# Patient Record
Sex: Female | Born: 1955
Health system: Southern US, Community
[De-identification: ages and names within clinical notes are randomized; demographics above are authoritative.]

## PROBLEM LIST (undated history)

## (undated) DIAGNOSIS — Z9889 Other specified postprocedural states: Secondary | ICD-10-CM

## (undated) DIAGNOSIS — D649 Anemia, unspecified: Secondary | ICD-10-CM

## (undated) DIAGNOSIS — E7800 Pure hypercholesterolemia, unspecified: Secondary | ICD-10-CM

## (undated) DIAGNOSIS — C801 Malignant (primary) neoplasm, unspecified: Secondary | ICD-10-CM

## (undated) DIAGNOSIS — Z720 Tobacco use: Secondary | ICD-10-CM

## (undated) DIAGNOSIS — I10 Essential (primary) hypertension: Secondary | ICD-10-CM

## (undated) DIAGNOSIS — R0600 Dyspnea, unspecified: Secondary | ICD-10-CM

## (undated) DIAGNOSIS — F419 Anxiety disorder, unspecified: Secondary | ICD-10-CM

## (undated) DIAGNOSIS — K219 Gastro-esophageal reflux disease without esophagitis: Secondary | ICD-10-CM

## (undated) DIAGNOSIS — R112 Nausea with vomiting, unspecified: Secondary | ICD-10-CM

## (undated) HISTORY — PX: ABDOMINAL HYSTERECTOMY: SHX81

## (undated) HISTORY — DX: Gastro-esophageal reflux disease without esophagitis: K21.9

## (undated) HISTORY — PX: DILATION AND CURETTAGE OF UTERUS: SHX78

## (undated) HISTORY — DX: Tobacco use: Z72.0

## (undated) HISTORY — DX: Pure hypercholesterolemia, unspecified: E78.00

## (undated) HISTORY — PX: TUBAL LIGATION: SHX77

## (undated) HISTORY — PX: CHOLECYSTECTOMY: SHX55

## (undated) HISTORY — DX: Anxiety disorder, unspecified: F41.9

---

## 1998-09-30 ENCOUNTER — Other Ambulatory Visit: Admission: RE | Admit: 1998-09-30 | Discharge: 1998-09-30 | Payer: Self-pay | Admitting: *Deleted

## 1998-10-23 ENCOUNTER — Encounter: Payer: Self-pay | Admitting: Internal Medicine

## 1998-10-23 ENCOUNTER — Ambulatory Visit (HOSPITAL_COMMUNITY): Admission: RE | Admit: 1998-10-23 | Discharge: 1998-10-23 | Payer: Self-pay | Admitting: Internal Medicine

## 1999-03-07 ENCOUNTER — Encounter: Payer: Self-pay | Admitting: *Deleted

## 1999-03-09 ENCOUNTER — Observation Stay (HOSPITAL_COMMUNITY): Admission: RE | Admit: 1999-03-09 | Discharge: 1999-03-10 | Payer: Self-pay | Admitting: *Deleted

## 1999-08-14 ENCOUNTER — Other Ambulatory Visit: Admission: RE | Admit: 1999-08-14 | Discharge: 1999-08-14 | Payer: Self-pay | Admitting: *Deleted

## 1999-08-24 ENCOUNTER — Encounter: Payer: Self-pay | Admitting: Internal Medicine

## 1999-08-24 ENCOUNTER — Encounter: Admission: RE | Admit: 1999-08-24 | Discharge: 1999-08-24 | Payer: Self-pay | Admitting: Internal Medicine

## 2000-08-26 ENCOUNTER — Encounter: Payer: Self-pay | Admitting: Internal Medicine

## 2000-08-26 ENCOUNTER — Encounter: Admission: RE | Admit: 2000-08-26 | Discharge: 2000-08-26 | Payer: Self-pay | Admitting: Internal Medicine

## 2000-08-27 ENCOUNTER — Encounter: Admission: RE | Admit: 2000-08-27 | Discharge: 2000-08-27 | Payer: Self-pay | Admitting: Internal Medicine

## 2000-08-27 ENCOUNTER — Encounter: Payer: Self-pay | Admitting: Internal Medicine

## 2000-09-20 ENCOUNTER — Encounter: Payer: Self-pay | Admitting: General Surgery

## 2000-09-23 ENCOUNTER — Encounter: Payer: Self-pay | Admitting: General Surgery

## 2000-09-23 ENCOUNTER — Observation Stay: Admission: RE | Admit: 2000-09-23 | Discharge: 2000-09-24 | Payer: Self-pay | Admitting: General Surgery

## 2000-10-03 ENCOUNTER — Other Ambulatory Visit: Admission: RE | Admit: 2000-10-03 | Discharge: 2000-10-03 | Payer: Self-pay | Admitting: *Deleted

## 2000-11-01 ENCOUNTER — Encounter: Admission: RE | Admit: 2000-11-01 | Discharge: 2000-11-01 | Payer: Self-pay | Admitting: Internal Medicine

## 2000-11-01 ENCOUNTER — Encounter: Payer: Self-pay | Admitting: Internal Medicine

## 2001-11-12 ENCOUNTER — Encounter: Admission: RE | Admit: 2001-11-12 | Discharge: 2001-11-12 | Payer: Self-pay | Admitting: Internal Medicine

## 2001-11-12 ENCOUNTER — Encounter: Payer: Self-pay | Admitting: Internal Medicine

## 2002-09-24 ENCOUNTER — Other Ambulatory Visit: Admission: RE | Admit: 2002-09-24 | Discharge: 2002-09-24 | Payer: Self-pay | Admitting: *Deleted

## 2003-08-12 ENCOUNTER — Encounter: Admission: RE | Admit: 2003-08-12 | Discharge: 2003-08-12 | Payer: Self-pay | Admitting: *Deleted

## 2003-11-03 ENCOUNTER — Other Ambulatory Visit: Admission: RE | Admit: 2003-11-03 | Discharge: 2003-11-03 | Payer: Self-pay | Admitting: *Deleted

## 2004-11-06 ENCOUNTER — Other Ambulatory Visit: Admission: RE | Admit: 2004-11-06 | Discharge: 2004-11-06 | Payer: Self-pay | Admitting: *Deleted

## 2005-02-08 ENCOUNTER — Encounter: Admission: RE | Admit: 2005-02-08 | Discharge: 2005-02-08 | Payer: Self-pay | Admitting: *Deleted

## 2005-12-18 ENCOUNTER — Other Ambulatory Visit: Admission: RE | Admit: 2005-12-18 | Discharge: 2005-12-18 | Payer: Self-pay | Admitting: *Deleted

## 2006-12-04 ENCOUNTER — Encounter: Admission: RE | Admit: 2006-12-04 | Discharge: 2006-12-04 | Payer: Self-pay | Admitting: Internal Medicine

## 2007-01-28 ENCOUNTER — Other Ambulatory Visit: Admission: RE | Admit: 2007-01-28 | Discharge: 2007-01-28 | Payer: Self-pay | Admitting: *Deleted

## 2007-09-19 ENCOUNTER — Encounter: Admission: RE | Admit: 2007-09-19 | Discharge: 2007-09-19 | Payer: Self-pay | Admitting: *Deleted

## 2007-11-27 ENCOUNTER — Encounter: Payer: Self-pay | Admitting: Cardiology

## 2008-04-15 ENCOUNTER — Encounter: Admission: RE | Admit: 2008-04-15 | Discharge: 2008-04-15 | Payer: Self-pay | Admitting: Internal Medicine

## 2008-06-29 ENCOUNTER — Ambulatory Visit: Payer: Self-pay | Admitting: Internal Medicine

## 2008-07-20 ENCOUNTER — Other Ambulatory Visit: Admission: RE | Admit: 2008-07-20 | Discharge: 2008-07-20 | Payer: Self-pay | Admitting: Gynecology

## 2008-09-15 ENCOUNTER — Ambulatory Visit: Payer: Self-pay | Admitting: Internal Medicine

## 2008-12-01 ENCOUNTER — Ambulatory Visit: Payer: Self-pay | Admitting: Internal Medicine

## 2008-12-24 ENCOUNTER — Encounter: Admission: RE | Admit: 2008-12-24 | Discharge: 2008-12-24 | Payer: Self-pay | Admitting: Orthopedic Surgery

## 2009-11-10 ENCOUNTER — Encounter: Payer: Self-pay | Admitting: Cardiology

## 2009-11-10 ENCOUNTER — Other Ambulatory Visit: Admission: RE | Admit: 2009-11-10 | Discharge: 2009-11-10 | Payer: Self-pay | Admitting: Internal Medicine

## 2009-11-10 ENCOUNTER — Ambulatory Visit: Payer: Self-pay | Admitting: Internal Medicine

## 2010-06-30 ENCOUNTER — Ambulatory Visit: Payer: Self-pay | Admitting: Internal Medicine

## 2010-09-30 ENCOUNTER — Encounter: Payer: Self-pay | Admitting: Internal Medicine

## 2010-10-01 ENCOUNTER — Encounter: Payer: Self-pay | Admitting: Orthopedic Surgery

## 2010-10-01 ENCOUNTER — Encounter: Payer: Self-pay | Admitting: *Deleted

## 2010-10-02 ENCOUNTER — Encounter: Payer: Self-pay | Admitting: Orthopedic Surgery

## 2011-03-10 ENCOUNTER — Other Ambulatory Visit: Payer: Self-pay | Admitting: Internal Medicine

## 2011-04-05 ENCOUNTER — Encounter: Payer: Self-pay | Admitting: Internal Medicine

## 2011-04-05 ENCOUNTER — Ambulatory Visit: Payer: Self-pay | Admitting: Internal Medicine

## 2011-04-10 ENCOUNTER — Other Ambulatory Visit: Payer: Self-pay | Admitting: Internal Medicine

## 2011-04-26 ENCOUNTER — Ambulatory Visit (INDEPENDENT_AMBULATORY_CARE_PROVIDER_SITE_OTHER): Payer: 59 | Admitting: Internal Medicine

## 2011-04-26 ENCOUNTER — Encounter: Payer: Self-pay | Admitting: Internal Medicine

## 2011-04-26 VITALS — BP 120/62 | HR 72 | Temp 98.9°F | Ht 63.5 in | Wt 146.0 lb

## 2011-04-26 DIAGNOSIS — F411 Generalized anxiety disorder: Secondary | ICD-10-CM

## 2011-04-26 DIAGNOSIS — N3289 Other specified disorders of bladder: Secondary | ICD-10-CM

## 2011-04-26 DIAGNOSIS — R102 Pelvic and perineal pain: Secondary | ICD-10-CM

## 2011-04-26 DIAGNOSIS — F419 Anxiety disorder, unspecified: Secondary | ICD-10-CM

## 2011-04-26 DIAGNOSIS — N949 Unspecified condition associated with female genital organs and menstrual cycle: Secondary | ICD-10-CM

## 2011-04-26 LAB — POCT URINALYSIS DIPSTICK
Bilirubin, UA: NEGATIVE
Glucose, UA: NEGATIVE
Ketones, UA: NEGATIVE
Leukocytes, UA: NEGATIVE
Nitrite, UA: NEGATIVE
Protein, UA: NEGATIVE
Spec Grav, UA: 1.02
Urobilinogen, UA: NEGATIVE
pH, UA: 5

## 2011-04-27 ENCOUNTER — Encounter: Payer: Self-pay | Admitting: Internal Medicine

## 2011-04-29 ENCOUNTER — Encounter: Payer: Self-pay | Admitting: Internal Medicine

## 2011-04-29 DIAGNOSIS — F419 Anxiety disorder, unspecified: Secondary | ICD-10-CM | POA: Insufficient documentation

## 2011-04-29 NOTE — Progress Notes (Signed)
  Subjective:    Patient ID: Virginia [name], female    DOB: 1955-09-23, 55 y.o.   MRN: [id number]  HPI patient went to see GYN physician recently regarding some discomfort in her GU and vaginal area. Apparently discomfort has since resolved but she was worried about possible urinary tract infection. Currently having no dysuria or frequency    Review of Systems     Objective:   Physical Exam no CVA tenderness. Urinalysis within normal limits       Assessment & Plan:  Vaginal pain  No urinary tract infection detected  Patient discuss any further issues with vaginal pain with GYN physician

## 2011-05-09 ENCOUNTER — Other Ambulatory Visit: Payer: Self-pay | Admitting: Internal Medicine

## 2011-06-19 ENCOUNTER — Ambulatory Visit (INDEPENDENT_AMBULATORY_CARE_PROVIDER_SITE_OTHER): Payer: 59 | Admitting: Internal Medicine

## 2011-06-19 ENCOUNTER — Encounter: Payer: Self-pay | Admitting: Internal Medicine

## 2011-06-19 VITALS — BP 122/76 | HR 78 | Temp 98.1°F | Ht 63.0 in | Wt 147.0 lb

## 2011-06-19 DIAGNOSIS — J40 Bronchitis, not specified as acute or chronic: Secondary | ICD-10-CM

## 2011-06-19 NOTE — Progress Notes (Signed)
  Subjective:    Patient ID: Virginia [name], female    DOB: [birth year], 55 y.o.   MRN: [id number]  HPI onset of URI symptoms and cough on 06/16/2011. Patient has not gotten better. Cough has become quite bothersome and is becoming productive of yellow sputum. Has malaise and fatigue. Denies sore throat or ear pain.    Review of Systems     Objective:   Physical Exam left TM slightly full right TM clear; pharynx slightly injected without exudate; neck is supple without significant adenopathy; chest clear. Cough sounds deep and congested.        Assessment & Plan:  Bronchitis  Plan: Biaxin 500 mg by mouth twice daily for 10 days; Hycodan 8 ounces 1 teaspoon by mouth every 6 hours for cough

## 2011-09-21 ENCOUNTER — Other Ambulatory Visit: Payer: Self-pay

## 2011-09-21 MED ORDER — ALPRAZOLAM 0.25 MG PO TABS: 0.2500 mg | ORAL_TABLET | Freq: Two times a day (BID) | ORAL | Status: DC | PRN

## 2011-09-24 ENCOUNTER — Encounter (HOSPITAL_COMMUNITY): Payer: Self-pay | Admitting: *Deleted

## 2011-09-24 ENCOUNTER — Other Ambulatory Visit: Payer: Self-pay

## 2011-09-24 ENCOUNTER — Telehealth: Payer: Self-pay | Admitting: Cardiology

## 2011-09-24 ENCOUNTER — Emergency Department (HOSPITAL_COMMUNITY)
Admission: EM | Admit: 2011-09-24 | Discharge: 2011-09-24 | Discharge status: Against Medical Advice | Payer: 59 | Attending: Emergency Medicine | Admitting: Emergency Medicine

## 2011-09-24 DIAGNOSIS — M79609 Pain in unspecified limb: Secondary | ICD-10-CM | POA: Insufficient documentation

## 2011-09-24 HISTORY — DX: Essential (primary) hypertension: I10

## 2011-09-24 NOTE — Telephone Encounter (Signed)
Pt starting having shoulder, arm, neck pain since Friday, dizziness when closed her eyes, pls call

## 2011-09-24 NOTE — ED Notes (Signed)
Pt complains of pain in left arm and neck, started Friday, relief with ibuprofen. Pt sts that she has a hx of shoulder problems and pain. Also sts that she has periodic cramps below left ribs that feel like gas.

## 2011-09-24 NOTE — ED Notes (Signed)
To ed for eval of left arm pain, neck pain, and shoulder pain. Started saturday

## 2011-09-24 NOTE — Telephone Encounter (Signed)
She should see her PCP about these complaints and have him refer her back to [name] if PCP thinks cardiac

## 2011-09-24 NOTE — Telephone Encounter (Signed)
Advised patient, stated she would call PCP

## 2011-09-24 NOTE — Telephone Encounter (Signed)
Has not been seen in about 10 years.  Left sided arm pain, weakness over weekend, and heart racing at times.  See or refer to PCP

## 2011-09-25 ENCOUNTER — Other Ambulatory Visit: Payer: Self-pay

## 2011-09-25 MED ORDER — ALPRAZOLAM 0.25 MG PO TABS: 0.2500 mg | ORAL_TABLET | Freq: Two times a day (BID) | ORAL | Status: DC | PRN

## 2011-10-15 ENCOUNTER — Encounter: Payer: Self-pay | Admitting: Internal Medicine

## 2011-10-15 ENCOUNTER — Ambulatory Visit (INDEPENDENT_AMBULATORY_CARE_PROVIDER_SITE_OTHER): Payer: 59 | Admitting: Internal Medicine

## 2011-10-15 VITALS — BP 142/90 | HR 80 | Temp 98.4°F | Wt 149.5 lb

## 2011-10-15 DIAGNOSIS — J029 Acute pharyngitis, unspecified: Secondary | ICD-10-CM

## 2011-10-15 DIAGNOSIS — J069 Acute upper respiratory infection, unspecified: Secondary | ICD-10-CM

## 2011-10-15 LAB — POCT RAPID STREP A (OFFICE): Rapid Strep A Screen: NEGATIVE

## 2011-11-01 ENCOUNTER — Other Ambulatory Visit: Payer: 59 | Admitting: Internal Medicine

## 2011-11-02 ENCOUNTER — Encounter: Payer: 59 | Admitting: Internal Medicine

## 2011-11-10 ENCOUNTER — Encounter: Payer: Self-pay | Admitting: Internal Medicine

## 2011-11-10 NOTE — Patient Instructions (Signed)
Take antibiotic as prescribed with food twice daily. Take Hycodan as needed for cough every 6 hours.

## 2011-11-10 NOTE — Progress Notes (Signed)
  Subjective:    Patient ID: Virginia [name], female    DOB: Sep 26, 1955, 56 y.o.   MRN: [id number]  HPI  Onset a few days ago sore throat coughing congestion. Some discolored sputum. No fever or shaking chills. Malaise and fatigue.    Review of Systems     Objective:   Physical Exam HEENT exam: Pharynx slightly injected without exudate; TMs slightly full and not red; neck is supple without significant adenopathy. Chest clear.        Assessment & Plan:  Upper respiratory infection with early secondary bacterial infection  Plan: Biaxin 500 mg by mouth twice daily for 10 days. Hycodan 8 ounces 1 teaspoon by mouth every 6 hours when necessary cough

## 2011-12-13 ENCOUNTER — Other Ambulatory Visit: Payer: Self-pay

## 2011-12-14 ENCOUNTER — Other Ambulatory Visit: Payer: 59 | Admitting: Internal Medicine

## 2011-12-14 DIAGNOSIS — Z0000 Encounter for general adult medical examination without abnormal findings: Secondary | ICD-10-CM

## 2011-12-14 LAB — COMPREHENSIVE METABOLIC PANEL
ALT: 29 U/L (ref 0–35)
AST: 23 U/L (ref 0–37)
Albumin: 4 g/dL (ref 3.5–5.2)
Alkaline Phosphatase: 72 U/L (ref 39–117)
BUN: 13 mg/dL (ref 6–23)
CO2: 23 mEq/L (ref 19–32)
Calcium: 9 mg/dL (ref 8.4–10.5)
Chloride: 108 mEq/L (ref 96–112)
Creat: 0.62 mg/dL (ref 0.50–1.10)
Glucose, Bld: 95 mg/dL (ref 70–99)
Potassium: 4.4 mEq/L (ref 3.5–5.3)
Sodium: 142 mEq/L (ref 135–145)
Total Bilirubin: 0.3 mg/dL (ref 0.3–1.2)
Total Protein: 6.4 g/dL (ref 6.0–8.3)

## 2011-12-14 LAB — CBC WITH DIFFERENTIAL/PLATELET
Basophils Absolute: 0 10*3/uL (ref 0.0–0.1)
Basophils Relative: 0 % (ref 0–1)
Eosinophils Absolute: 0.2 10*3/uL (ref 0.0–0.7)
Eosinophils Relative: 3 % (ref 0–5)
HCT: 38.3 % (ref 36.0–46.0)
Hemoglobin: 12.9 g/dL (ref 12.0–15.0)
Lymphocytes Relative: 49 % — ABNORMAL HIGH (ref 12–46)
Lymphs Abs: 3.4 10*3/uL (ref 0.7–4.0)
MCH: 31.5 pg (ref 26.0–34.0)
MCHC: 33.7 g/dL (ref 30.0–36.0)
MCV: 93.4 fL (ref 78.0–100.0)
Monocytes Absolute: 0.5 10*3/uL (ref 0.1–1.0)
Monocytes Relative: 8 % (ref 3–12)
Neutro Abs: 2.8 10*3/uL (ref 1.7–7.7)
Neutrophils Relative %: 40 % — ABNORMAL LOW (ref 43–77)
Platelets: 347 10*3/uL (ref 150–400)
RBC: 4.1 MIL/uL (ref 3.87–5.11)
RDW: 13.1 % (ref 11.5–15.5)
WBC: 6.9 10*3/uL (ref 4.0–10.5)

## 2011-12-14 LAB — LIPID PANEL
Cholesterol: 238 mg/dL — ABNORMAL HIGH (ref 0–200)
HDL: 78 mg/dL (ref >39)
LDL Cholesterol: 137 mg/dL — ABNORMAL HIGH (ref 0–99)
Total CHOL/HDL Ratio: 3.1 Ratio
Triglycerides: 113 mg/dL (ref <150)
VLDL: 23 mg/dL (ref 0–40)

## 2011-12-14 LAB — TSH: TSH: 1.963 u[IU]/mL (ref 0.350–4.500)

## 2011-12-15 LAB — VITAMIN D 25 HYDROXY (VIT D DEFICIENCY, FRACTURES): Vit D, 25-Hydroxy: 35 ng/mL (ref 30–89)

## 2011-12-17 ENCOUNTER — Encounter: Payer: 59 | Admitting: Internal Medicine

## 2011-12-25 ENCOUNTER — Ambulatory Visit
Admission: RE | Admit: 2011-12-25 | Discharge: 2011-12-25 | Disposition: A | Discharge status: Home / Self Care | Payer: 59 | Source: Ambulatory Visit | Attending: Internal Medicine | Admitting: Internal Medicine

## 2011-12-25 ENCOUNTER — Ambulatory Visit (INDEPENDENT_AMBULATORY_CARE_PROVIDER_SITE_OTHER): Payer: 59 | Admitting: Internal Medicine

## 2011-12-25 ENCOUNTER — Encounter: Payer: Self-pay | Admitting: Internal Medicine

## 2011-12-25 VITALS — BP 128/78 | HR 80 | Resp 20 | Ht 63.0 in | Wt 147.0 lb

## 2011-12-25 DIAGNOSIS — R9431 Abnormal electrocardiogram [ECG] [EKG]: Secondary | ICD-10-CM

## 2011-12-25 DIAGNOSIS — Z0000 Encounter for general adult medical examination without abnormal findings: Secondary | ICD-10-CM

## 2011-12-25 DIAGNOSIS — F419 Anxiety disorder, unspecified: Secondary | ICD-10-CM

## 2011-12-25 DIAGNOSIS — F411 Generalized anxiety disorder: Secondary | ICD-10-CM

## 2011-12-25 DIAGNOSIS — I10 Essential (primary) hypertension: Secondary | ICD-10-CM

## 2011-12-25 DIAGNOSIS — R079 Chest pain, unspecified: Secondary | ICD-10-CM

## 2011-12-25 DIAGNOSIS — R002 Palpitations: Secondary | ICD-10-CM

## 2011-12-25 DIAGNOSIS — E785 Hyperlipidemia, unspecified: Secondary | ICD-10-CM | POA: Insufficient documentation

## 2011-12-25 DIAGNOSIS — Z87891 Personal history of nicotine dependence: Secondary | ICD-10-CM

## 2011-12-25 LAB — POCT URINALYSIS DIPSTICK
Bilirubin, UA: NEGATIVE
Glucose, UA: NEGATIVE
Ketones, UA: NEGATIVE
Leukocytes, UA: NEGATIVE
Nitrite, UA: NEGATIVE
Spec Grav, UA: 1.015
Urobilinogen, UA: NEGATIVE
pH, UA: 6

## 2011-12-25 NOTE — Patient Instructions (Signed)
We will make appointment for you to see cardiologist regarding chest pain. Please have chest x-ray done. Please try to quit smoking. Please return in 6 months for office visit, lipid panel and followup.

## 2011-12-26 ENCOUNTER — Ambulatory Visit (INDEPENDENT_AMBULATORY_CARE_PROVIDER_SITE_OTHER): Payer: 59 | Admitting: Cardiology

## 2011-12-26 ENCOUNTER — Encounter: Payer: Self-pay | Admitting: Cardiology

## 2011-12-26 DIAGNOSIS — R079 Chest pain, unspecified: Secondary | ICD-10-CM | POA: Insufficient documentation

## 2011-12-26 DIAGNOSIS — E7800 Pure hypercholesterolemia, unspecified: Secondary | ICD-10-CM

## 2011-12-26 DIAGNOSIS — R9431 Abnormal electrocardiogram [ECG] [EKG]: Secondary | ICD-10-CM | POA: Insufficient documentation

## 2011-12-26 DIAGNOSIS — Z720 Tobacco use: Secondary | ICD-10-CM

## 2011-12-26 DIAGNOSIS — F17200 Nicotine dependence, unspecified, uncomplicated: Secondary | ICD-10-CM

## 2011-12-26 NOTE — Patient Instructions (Signed)
We will schedule you for a nuclear stress test. Hold your metoprolol the day of your test.  I would start taking ASA 81 mg daily.  Stop smoking

## 2011-12-26 NOTE — Assessment & Plan Note (Signed)
Her symptoms of chest pain are somewhat atypical but are concerning given her multiple cardiac risk factors and now ECG changes. She has a history of tobacco use, hypertension, and hypercholesterolemia. She has T-wave changes in the anterior precordial leads. This places her at least at intermediate risk for coronary disease. I recommended a stress Myoview study to evaluate further. If abnormal we will need to proceed to cardiac catheterization. I stressed the importance of lifestyle modification including complete smoking cessation. I recommended Mediterranean diet. If she does have evidence of coronary disease she will need to go on statin therapy. I recommended that she start taking aspirin 81 mg daily.

## 2011-12-26 NOTE — Progress Notes (Signed)
Virginia [name] Date of Birth: Jan 12, 1956 Medical Record #[id number]  History of Present Illness: Virginia [name] is seen today at the request of Virginia [name] for evaluation of chest pain. She is a pleasant 56 year old white female who has a history of tobacco abuse, hypertension, and hypercholesterolemia. She states that she has had a sensation in her chest for over a year where it will flutter and she will become lightheaded. Over the past 3 months she's experienced symptoms of intermittent chest pain. She describes a pain in the left precordial region region or beneath her left breast. It has been associated with left arm pain and tingling and radiation into the left back. She wondered if this was related to the fact that she is working out and [employer]. On one occasion her pain became bad enough that she went to the emergency room. This was in January. She stayed only long enough to get an EKG and then left before being seen because she was very anxious. She has not had that bad of pain again but still has intermittent chest pain.  Current Outpatient Prescriptions on File Prior to Visit  Medication Sig Dispense Refill  . ALPRAZolam (XANAX) 0.25 MG tablet Take 1 tablet (0.25 mg total) by mouth 2 (two) times daily as needed. Anxiety.  60 tablet  2  . ibuprofen (ADVIL,MOTRIN) 800 MG tablet Take 800 mg by mouth every 8 (eight) hours as needed. As needed for pain.      . metoprolol tartrate (LOPRESSOR) 25 MG tablet TAKE 1 TABLET BY MOUTH TWICE DAILY  60 tablet  PRN  . OVER THE COUNTER MEDICATION Apply 1 application topically daily as needed. [place] Gel OTC. As needed for shoulder pain.      . pantoprazole (PROTONIX) 40 MG tablet Take 40 mg by mouth daily.          No Known Allergies  Past Medical History  Diagnosis Date  . Anxiety   . GERD (gastroesophageal reflux disease)   . Hypertension   . Hypercholesterolemia   . Tobacco abuse     Past Surgical History  Procedure Date  . Tubal  ligation   . Abdominal hysterectomy   . Cholecystectomy   . Dilation and curettage of uterus     History  Smoking status  . Current Everyday Smoker -- 0.2 packs/day  Smokeless tobacco  . Never Used    History  Alcohol Use  . Yes    Family History  Problem Relation Age of Onset  . Heart disease Father   . Drug abuse Daughter   . Drug abuse Son   . Cancer Sister     Review of Systems: The review of systems is positive for ongoing tobacco use. She is trying to quit and has cut down to 2 cigarettes per day.  Her diet is poor and she admits to eating a lot of cheese and pork. All other systems were reviewed and are negative.  Physical Exam: BP 137/98  Pulse 65  Ht 5\' 3"  (1.6 m)  Wt 66.225 kg (146 lb)  BMI 25.86 kg/m2 She is a pleasant white female in no acute distress. She is normocephalic, atraumatic. His are equal round and reactive. Sclera clear. Oropharynx is clear. Neck is supple without JVD, adenopathy, thyromegaly, or bruits. Lungs are clear. Cardiac exam reveals a regular rate and rhythm without gallop, murmur, or click. Abdomen is soft and nontender. She has no masses or hepatosplenomegaly. Femoral and pedal pulses are 2+ and symmetric.  She has no edema or phlebitis. Skin is warm and dry. She is alert and oriented x3. Cranial nerves II through XII are intact. LABORATORY DATA: ECG dated 11/27/2007 is normal. ECG dated 09/24/2011 shows mild T wave inversion in the anterior leads. ECG today demonstrates increased T wave inversions in leads V1 through V4.  Recent laboratory data including CBC, complete chemistry, and TSH were normal. Total cholesterol 238, triglycerides 113, HDL 78, LDL 137. Assessment / Plan:

## 2012-01-02 ENCOUNTER — Ambulatory Visit (HOSPITAL_COMMUNITY): Payer: 59 | Attending: Cardiovascular Disease | Admitting: Radiology

## 2012-01-02 DIAGNOSIS — E785 Hyperlipidemia, unspecified: Secondary | ICD-10-CM | POA: Insufficient documentation

## 2012-01-02 DIAGNOSIS — F17200 Nicotine dependence, unspecified, uncomplicated: Secondary | ICD-10-CM | POA: Insufficient documentation

## 2012-01-02 DIAGNOSIS — R002 Palpitations: Secondary | ICD-10-CM | POA: Insufficient documentation

## 2012-01-02 DIAGNOSIS — M79609 Pain in unspecified limb: Secondary | ICD-10-CM | POA: Insufficient documentation

## 2012-01-02 DIAGNOSIS — I10 Essential (primary) hypertension: Secondary | ICD-10-CM | POA: Insufficient documentation

## 2012-01-02 DIAGNOSIS — Z8249 Family history of ischemic heart disease and other diseases of the circulatory system: Secondary | ICD-10-CM | POA: Insufficient documentation

## 2012-01-02 DIAGNOSIS — Z720 Tobacco use: Secondary | ICD-10-CM

## 2012-01-02 DIAGNOSIS — R079 Chest pain, unspecified: Secondary | ICD-10-CM

## 2012-01-02 DIAGNOSIS — R9431 Abnormal electrocardiogram [ECG] [EKG]: Secondary | ICD-10-CM

## 2012-01-02 DIAGNOSIS — E7800 Pure hypercholesterolemia, unspecified: Secondary | ICD-10-CM | POA: Insufficient documentation

## 2012-01-02 DIAGNOSIS — M549 Dorsalgia, unspecified: Secondary | ICD-10-CM | POA: Insufficient documentation

## 2012-01-02 DIAGNOSIS — R42 Dizziness and giddiness: Secondary | ICD-10-CM | POA: Insufficient documentation

## 2012-01-02 MED ORDER — TECHNETIUM TC 99M TETROFOSMIN IV KIT
33.0000 | PACK | Freq: Once | INTRAVENOUS | Status: AC | PRN
  Administered 2012-01-02: 33 via INTRAVENOUS

## 2012-01-02 MED ORDER — TECHNETIUM TC 99M TETROFOSMIN IV KIT
11.0000 | PACK | Freq: Once | INTRAVENOUS | Status: AC | PRN
  Administered 2012-01-02: 11 via INTRAVENOUS

## 2012-01-02 NOTE — Progress Notes (Signed)
[facility] SITE 3 NUCLEAR MED [address] [place] [address] [phone]  Cardiology Nuclear Med Study  Virginia [name] is a 56 y.o. female     MRN : [id number]     DOB: 1956-04-14  Procedure Date: 01/02/2012  Nuclear Med Background Indication for Stress Test:  Evaluation for Ischemia and Abnormal EKG History:  ~10 yrs ago ION:GEXBMW Cardiac Risk Factors: Family History - CAD, Hypertension, Lipids and Smoker  Symptoms:  Chest Pain>Back/(L) Arm (last episode of chest discomfort February), Light-Headedness and Palpitations   Nuclear Pre-Procedure Caffeine/Decaff Intake:  None NPO After: 7:00pm   Lungs:  clear O2 Sat: 95% on room air. IV 0.9% NS with Angio Cath:  20g  IV Site: R Antecubital  IV Started by:  Stanton Kidney, EMT-P  Chest Size (in):  38 Cup Size: B  Height: 5\' 3"  (1.6 m)  Weight:  145 lb (65.772 kg)  BMI:  Body mass index is 25.69 kg/(m^2). Tech Comments:  Metoprolol held > 24 hours, per patient.    Nuclear Med Study 1 or 2 day study: 1 day  Stress Test Type:  Stress  Reading MD: Charlton Haws, MD  Order Authorizing Provider:  Rayaan Garguilo [place], MD  Resting Radionuclide: Technetium 19m Tetrofosmin  Resting Radionuclide Dose: 11.0 mCi   Stress Radionuclide:  Technetium 70m Tetrofosmin  Stress Radionuclide Dose: 33.0 mCi           Stress Protocol Rest HR: 63 Stress HR: 146  Rest BP: 146/101 Stress BP: 207/113  Exercise Time (min): 8:30 METS: 10.1   Predicted Max HR: 166 bpm % Max HR: 87.95 bpm Rate Pressure Product: 41324   Dose of Adenosine (mg):  n/a Dose of Lexiscan: n/a mg  Dose of Atropine (mg): n/a Dose of Dobutamine: n/a mcg/kg/min (at max HR)  Stress Test Technologist: Smiley Houseman, CMA-N  Nuclear Technologist:  Domenic Polite, CNMT     Rest Procedure:  Myocardial perfusion imaging was performed at rest 45 minutes following the intravenous administration of Technetium 21m Tetrofosmin.  Rest ECG: Nonspecific T-wave  changes.  Stress Procedure:  The patient exercised on the treadmill utilizing the Bruce protocol for 8:30 minutes. She then stopped due to fatigue and denied any chest pain.  There were no diagnostic ST-T wave changes; occasional PVC's and PAC's were noted.  She had a hypertensive response to exercise, 207/113.  Technetium 86m Tetrofosmin was injected at peak exercise and myocardial perfusion imaging was performed after a brief delay. Stress ECG: No significant change from baseline ECG  QPS Raw Data Images:  Normal; no motion artifact; normal heart/lung ratio. Stress Images:  Normal homogeneous uptake in all areas of the myocardium. Rest Images:  Normal homogeneous uptake in all areas of the myocardium. Subtraction (SDS):  Normal Transient Ischemic Dilatation (Normal <1.22):  1.01 Lung/Heart Ratio (Normal <0.45):  0.37  Quantitative Gated Spect Images QGS EDV:  77 ml QGS ESV:  28 ml  Impression Exercise Capacity:  Good exercise capacity. BP Response:  Hypertensive blood pressure response. Clinical Symptoms:  No significant symptoms noted. ECG Impression:  No significant ST segment change suggestive of ischemia. Comparison with Prior Nuclear Study: No previous nuclear study performed  Overall Impression:  Normal stress nuclear study.  LV Ejection Fraction: 64%.  LV Wall Motion:  NL LV Function; NL Wall Motion  Charlton Haws

## 2012-01-11 DIAGNOSIS — I10 Essential (primary) hypertension: Secondary | ICD-10-CM | POA: Insufficient documentation

## 2012-01-11 DIAGNOSIS — R002 Palpitations: Secondary | ICD-10-CM | POA: Insufficient documentation

## 2012-01-11 NOTE — Progress Notes (Signed)
  Subjective:    Patient ID: Virginia [name], female    DOB: 1956-01-17, 56 y.o.   MRN: [id number]  HPI 56 year old white female in today for health maintenance exam. History of anxiety. Has had recent chest pain. She went to the emergency department in January 2013 and did not stay for complete evaluation because of long wait time. She did have an EKG which shows some change from previous EKG done here in March 2009. EKG shows incomplete right bundle branch block T-wave inversion in leads V2 and V3. History of GE reflux. History of left wrist fracture at age 45. No known drug allergies. History of bilateral tubal ligation 1995, D&C January 2000, vaginal hysterectomy July 2000, laparoscopic cholecystectomy January 2002. GYN was Dr. Randell Patient who is now deceased. History of asthmatic bronchitis, history of hematuria seen by urologist with cystoscopy that was negative in June 2009. History of hypertension and palpitations treated with Lopressor   Father died at age 56 of an MI one sister died of complications of HIV another sister died of cancer one sister living and one brother living 3 adult children 2 daughters and one son  She is married. 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 Has smoked for over 20 years. Social alcohol consumption.   Review of Systems  Constitutional: Negative.   HENT: Negative.   Eyes: Negative.   Cardiovascular: Positive for chest pain.  Gastrointestinal:       History of GE reflux  Musculoskeletal: Positive for arthralgias.  Neurological: Negative.   Psychiatric/Behavioral:       History of anxiety   has more chest discomfort and pressure than frank chest pain. Associated with some slight shortness of breath. No radiation of pain down left arm or up neck.     Objective:   Physical Exam  Vitals reviewed. Constitutional: No distress.  HENT:  Head: Normocephalic and atraumatic.  Right Ear: External ear normal.  Left Ear: External ear normal.  Mouth/Throat:  Oropharynx is clear and moist.  Eyes: Conjunctivae and EOM are normal. Pupils are equal, round, and reactive to light. Right eye exhibits no discharge. Left eye exhibits no discharge.  Neck: Neck supple. No JVD present. No thyromegaly present.  Cardiovascular: Normal rate, regular rhythm and normal heart sounds.   No murmur heard. Pulmonary/Chest: No respiratory distress. She has no wheezes. She has no rales.       Breasts normal female  Abdominal: Bowel sounds are normal. She exhibits no mass. There is no tenderness. There is no rebound.  Musculoskeletal: Normal range of motion. She exhibits no edema.  Lymphadenopathy:    She has no cervical adenopathy.  Neurological: She is alert. She has normal reflexes. No cranial nerve deficit. Coordination normal.  Skin: Skin is warm and dry. No rash noted. She is not diaphoretic.  Psychiatric: She has a normal mood and affect. Her behavior is normal. Judgment and thought content normal.          Assessment & Plan:  Chest pain/pressure with abnormal EKG compared to previous EKG 2009  History of GE reflux  History of anxiety  History of hypertension  History of palpitations treated with Lopressor  Plan: Antianxiety medication refilled for 6 months. Patient is to see cardiologist as soon as possible.

## 2012-01-14 ENCOUNTER — Other Ambulatory Visit: Payer: Self-pay

## 2012-01-14 MED ORDER — PANTOPRAZOLE SODIUM 40 MG PO TBEC: 40.0000 mg | DELAYED_RELEASE_TABLET | Freq: Every day | ORAL | Status: DC

## 2012-01-22 ENCOUNTER — Other Ambulatory Visit: Payer: Self-pay

## 2012-01-22 MED ORDER — PANTOPRAZOLE SODIUM 40 MG PO TBEC: 40.0000 mg | DELAYED_RELEASE_TABLET | Freq: Every day | ORAL | Status: DC

## 2012-02-08 ENCOUNTER — Encounter: Payer: 59 | Admitting: Internal Medicine

## 2012-04-21 ENCOUNTER — Other Ambulatory Visit: Payer: Self-pay

## 2012-04-21 MED ORDER — METOPROLOL TARTRATE 25 MG PO TABS: 25.0000 mg | ORAL_TABLET | Freq: Two times a day (BID) | ORAL | Status: DC

## 2012-04-21 MED ORDER — ALPRAZOLAM 0.25 MG PO TABS: 0.2500 mg | ORAL_TABLET | Freq: Two times a day (BID) | ORAL | Status: DC | PRN

## 2012-06-23 ENCOUNTER — Other Ambulatory Visit: Payer: 59 | Admitting: Internal Medicine

## 2012-06-24 ENCOUNTER — Ambulatory Visit: Payer: 59 | Admitting: Internal Medicine

## 2012-08-11 ENCOUNTER — Other Ambulatory Visit: Payer: Self-pay | Admitting: Internal Medicine

## 2012-09-15 ENCOUNTER — Other Ambulatory Visit: Payer: Self-pay

## 2012-09-15 MED ORDER — PANTOPRAZOLE SODIUM 40 MG PO TBEC: 40.0000 mg | DELAYED_RELEASE_TABLET | Freq: Every day | ORAL | Status: DC

## 2012-09-19 ENCOUNTER — Ambulatory Visit (INDEPENDENT_AMBULATORY_CARE_PROVIDER_SITE_OTHER): Payer: 59 | Admitting: Internal Medicine

## 2012-09-19 ENCOUNTER — Encounter: Payer: Self-pay | Admitting: Internal Medicine

## 2012-09-19 VITALS — BP 140/90 | Temp 98.5°F | Wt 149.0 lb

## 2012-09-19 DIAGNOSIS — J069 Acute upper respiratory infection, unspecified: Secondary | ICD-10-CM

## 2012-09-19 DIAGNOSIS — Z8659 Personal history of other mental and behavioral disorders: Secondary | ICD-10-CM

## 2012-09-22 NOTE — Patient Instructions (Addendum)
Take Biaxin 500 mg twice daily for 10 days. Take Hycodan sparingly for cough

## 2012-09-22 NOTE — Progress Notes (Signed)
  Subjective:    Patient ID: Virginia [name], female    DOB: 1955/10/20, 57 y.o.   MRN: [id number]  HPI 57 year old white female with history of anxiety. In today with acute URI symptoms. Has had sore throat nasal congestion and cough. No fever or shaking chills. [place] [employer] [place], [place] 13.    Review of Systems     Objective:   Physical Exam HEENT exam: Pharynx only slightly injected without exudate. TMs are clear. Neck is supple. Chest clear. Sounds nasally congested.       Assessment & Plan:  URI  Plan: Biaxin 500 mg twice daily for 10 days. Hycodan ( 8 ounces) 1 teaspoon by mouth every 6 hours when necessary cough.

## 2012-10-16 ENCOUNTER — Other Ambulatory Visit: Payer: Self-pay

## 2012-10-16 MED ORDER — METOPROLOL TARTRATE 25 MG PO TABS: 25.0000 mg | ORAL_TABLET | Freq: Two times a day (BID) | ORAL | Status: DC

## 2012-11-10 ENCOUNTER — Other Ambulatory Visit: Payer: Self-pay

## 2012-11-10 MED ORDER — ALPRAZOLAM 0.25 MG PO TABS: 0.2500 mg | ORAL_TABLET | Freq: Two times a day (BID) | ORAL | Status: DC | PRN

## 2012-12-01 ENCOUNTER — Other Ambulatory Visit: Payer: Self-pay

## 2012-12-01 MED ORDER — ALPRAZOLAM 0.25 MG PO TABS: 0.2500 mg | ORAL_TABLET | Freq: Two times a day (BID) | ORAL | Status: DC | PRN

## 2013-01-06 ENCOUNTER — Other Ambulatory Visit: Payer: 59 | Admitting: Internal Medicine

## 2013-01-06 DIAGNOSIS — Z0000 Encounter for general adult medical examination without abnormal findings: Secondary | ICD-10-CM

## 2013-01-06 LAB — COMPREHENSIVE METABOLIC PANEL
ALT: 15 U/L (ref 0–35)
AST: 15 U/L (ref 0–37)
Albumin: 4 g/dL (ref 3.5–5.2)
Alkaline Phosphatase: 72 U/L (ref 39–117)
BUN: 13 mg/dL (ref 6–23)
CO2: 26 mEq/L (ref 19–32)
Calcium: 9.1 mg/dL (ref 8.4–10.5)
Chloride: 104 mEq/L (ref 96–112)
Creat: 0.6 mg/dL (ref 0.50–1.10)
Glucose, Bld: 98 mg/dL (ref 70–99)
Potassium: 4.4 mEq/L (ref 3.5–5.3)
Sodium: 141 mEq/L (ref 135–145)
Total Bilirubin: 0.4 mg/dL (ref 0.3–1.2)
Total Protein: 6.7 g/dL (ref 6.0–8.3)

## 2013-01-06 LAB — CBC WITH DIFFERENTIAL/PLATELET
Basophils Absolute: 0.1 10*3/uL (ref 0.0–0.1)
Basophils Relative: 1 % (ref 0–1)
Eosinophils Absolute: 0.4 10*3/uL (ref 0.0–0.7)
Eosinophils Relative: 6 % — ABNORMAL HIGH (ref 0–5)
HCT: 38.4 % (ref 36.0–46.0)
Hemoglobin: 12.9 g/dL (ref 12.0–15.0)
Lymphocytes Relative: 47 % — ABNORMAL HIGH (ref 12–46)
Lymphs Abs: 3.4 10*3/uL (ref 0.7–4.0)
MCH: 30.7 pg (ref 26.0–34.0)
MCHC: 33.6 g/dL (ref 30.0–36.0)
MCV: 91.4 fL (ref 78.0–100.0)
Monocytes Absolute: 0.6 10*3/uL (ref 0.1–1.0)
Monocytes Relative: 8 % (ref 3–12)
Neutro Abs: 2.8 10*3/uL (ref 1.7–7.7)
Neutrophils Relative %: 38 % — ABNORMAL LOW (ref 43–77)
Platelets: 344 10*3/uL (ref 150–400)
RBC: 4.2 MIL/uL (ref 3.87–5.11)
RDW: 13.6 % (ref 11.5–15.5)
WBC: 7.3 10*3/uL (ref 4.0–10.5)

## 2013-01-06 LAB — LIPID PANEL
Cholesterol: 222 mg/dL — ABNORMAL HIGH (ref 0–200)
HDL: 83 mg/dL (ref >39)
LDL Cholesterol: 99 mg/dL (ref 0–99)
Total CHOL/HDL Ratio: 2.7 Ratio
Triglycerides: 200 mg/dL — ABNORMAL HIGH (ref <150)
VLDL: 40 mg/dL (ref 0–40)

## 2013-01-06 LAB — TSH: TSH: 2.114 u[IU]/mL (ref 0.350–4.500)

## 2013-01-07 LAB — VITAMIN D 25 HYDROXY (VIT D DEFICIENCY, FRACTURES): Vit D, 25-Hydroxy: 33 ng/mL (ref 30–89)

## 2013-01-08 ENCOUNTER — Encounter: Payer: 59 | Admitting: Internal Medicine

## 2013-02-12 ENCOUNTER — Other Ambulatory Visit: Payer: Self-pay

## 2013-02-12 MED ORDER — ALPRAZOLAM 0.25 MG PO TABS: 0.2500 mg | ORAL_TABLET | Freq: Two times a day (BID) | ORAL | Status: DC | PRN

## 2013-02-18 ENCOUNTER — Telehealth: Payer: Self-pay | Admitting: Internal Medicine

## 2013-02-19 ENCOUNTER — Other Ambulatory Visit: Payer: Self-pay

## 2013-02-19 DIAGNOSIS — G894 Chronic pain syndrome: Secondary | ICD-10-CM

## 2013-02-19 MED ORDER — IBUPROFEN 800 MG PO TABS: 800.0000 mg | ORAL_TABLET | Freq: Three times a day (TID) | ORAL | Status: DC | PRN

## 2013-02-19 NOTE — Telephone Encounter (Signed)
Please call in Ibuprofen 800 mg #90 one po tid with no refill

## 2013-02-26 ENCOUNTER — Encounter: Payer: Self-pay | Admitting: Internal Medicine

## 2013-03-20 ENCOUNTER — Other Ambulatory Visit: Payer: Self-pay

## 2013-03-20 DIAGNOSIS — G894 Chronic pain syndrome: Secondary | ICD-10-CM

## 2013-03-20 MED ORDER — IBUPROFEN 800 MG PO TABS: 800.0000 mg | ORAL_TABLET | Freq: Three times a day (TID) | ORAL | Status: DC | PRN

## 2013-03-20 MED ORDER — ALPRAZOLAM 0.25 MG PO TABS: 0.2500 mg | ORAL_TABLET | Freq: Two times a day (BID) | ORAL | Status: DC | PRN

## 2013-03-24 ENCOUNTER — Encounter: Payer: Self-pay | Admitting: Internal Medicine

## 2013-03-26 ENCOUNTER — Other Ambulatory Visit (HOSPITAL_COMMUNITY)
Admission: RE | Admit: 2013-03-26 | Discharge: 2013-03-26 | Disposition: A | Discharge status: Home / Self Care | Payer: 59 | Source: Ambulatory Visit | Attending: Internal Medicine | Admitting: Internal Medicine

## 2013-03-26 ENCOUNTER — Ambulatory Visit (INDEPENDENT_AMBULATORY_CARE_PROVIDER_SITE_OTHER): Payer: 59 | Admitting: Internal Medicine

## 2013-03-26 ENCOUNTER — Encounter: Payer: Self-pay | Admitting: Internal Medicine

## 2013-03-26 VITALS — BP 140/100 | HR 76 | Temp 98.3°F | Ht 63.0 in | Wt 150.0 lb

## 2013-03-26 DIAGNOSIS — Z01419 Encounter for gynecological examination (general) (routine) without abnormal findings: Secondary | ICD-10-CM | POA: Insufficient documentation

## 2013-03-26 DIAGNOSIS — R319 Hematuria, unspecified: Secondary | ICD-10-CM

## 2013-03-26 DIAGNOSIS — Z87891 Personal history of nicotine dependence: Secondary | ICD-10-CM

## 2013-03-26 DIAGNOSIS — K219 Gastro-esophageal reflux disease without esophagitis: Secondary | ICD-10-CM

## 2013-03-26 DIAGNOSIS — E781 Pure hyperglyceridemia: Secondary | ICD-10-CM

## 2013-03-26 DIAGNOSIS — F411 Generalized anxiety disorder: Secondary | ICD-10-CM

## 2013-03-26 DIAGNOSIS — Z87898 Personal history of other specified conditions: Secondary | ICD-10-CM

## 2013-03-26 DIAGNOSIS — Z8679 Personal history of other diseases of the circulatory system: Secondary | ICD-10-CM

## 2013-03-26 DIAGNOSIS — I10 Essential (primary) hypertension: Secondary | ICD-10-CM

## 2013-03-26 DIAGNOSIS — Z0000 Encounter for general adult medical examination without abnormal findings: Secondary | ICD-10-CM

## 2013-03-26 LAB — POCT URINALYSIS DIPSTICK
Bilirubin, UA: NEGATIVE
Glucose, UA: NEGATIVE
Ketones, UA: NEGATIVE
Leukocytes, UA: NEGATIVE
Nitrite, UA: NEGATIVE
Protein, UA: NEGATIVE
Spec Grav, UA: >=1.03
Urobilinogen, UA: NEGATIVE
pH, UA: 5.5

## 2013-03-27 LAB — URINALYSIS, ROUTINE W REFLEX MICROSCOPIC
Bilirubin Urine: NEGATIVE
Glucose, UA: NEGATIVE mg/dL
Ketones, ur: NEGATIVE mg/dL
Leukocytes, UA: NEGATIVE
Nitrite: NEGATIVE
Protein, ur: NEGATIVE mg/dL
Specific Gravity, Urine: 1.021 (ref 1.005–1.030)
Urobilinogen, UA: 0.2 mg/dL (ref 0.0–1.0)
pH: 5.5 (ref 5.0–8.0)

## 2013-03-27 LAB — URINALYSIS, MICROSCOPIC ONLY
Bacteria, UA: NONE SEEN
Casts: NONE SEEN
Crystals: NONE SEEN
Squamous Epithelial / LPF: NONE SEEN

## 2013-03-28 LAB — URINE CULTURE: Colony Count: 6000

## 2013-04-01 ENCOUNTER — Other Ambulatory Visit: Payer: Self-pay | Admitting: Internal Medicine

## 2013-04-01 DIAGNOSIS — N644 Mastodynia: Secondary | ICD-10-CM

## 2013-04-02 ENCOUNTER — Other Ambulatory Visit: Payer: Self-pay | Admitting: Internal Medicine

## 2013-04-02 DIAGNOSIS — Z780 Asymptomatic menopausal state: Secondary | ICD-10-CM

## 2013-04-14 ENCOUNTER — Other Ambulatory Visit: Payer: Self-pay | Admitting: Internal Medicine

## 2013-04-14 NOTE — Telephone Encounter (Signed)
Refill x 6 months 

## 2013-04-28 ENCOUNTER — Encounter: Payer: Self-pay | Admitting: Internal Medicine

## 2013-04-28 ENCOUNTER — Ambulatory Visit (INDEPENDENT_AMBULATORY_CARE_PROVIDER_SITE_OTHER): Payer: 59 | Admitting: Internal Medicine

## 2013-04-28 VITALS — BP 150/104 | HR 68 | Wt 150.0 lb

## 2013-04-28 DIAGNOSIS — I10 Essential (primary) hypertension: Secondary | ICD-10-CM

## 2013-04-28 LAB — POTASSIUM: Potassium: 4.2 mEq/L (ref 3.5–5.3)

## 2013-04-28 MED ORDER — LOSARTAN POTASSIUM 100 MG PO TABS: 100.0000 mg | ORAL_TABLET | Freq: Every day | ORAL | Status: DC

## 2013-04-28 NOTE — Patient Instructions (Addendum)
Discontinue Metoprolol and Start Losartan 100 mg daily.  Continue HCTZ and Return in 2 weeks for OV and B-met.

## 2013-04-29 ENCOUNTER — Other Ambulatory Visit: Payer: 59

## 2013-05-10 NOTE — Progress Notes (Signed)
  Subjective:    Patient ID: Virginia [name], female    DOB: [birth year], 57 y.o.   MRN: [id number]  HPI   At last visit in July which was her physical examination, she was started on HCTZ for blood pressure and fluid retention in addition to metoprolol which she was already on for palpitations. She's in today for recheck. Blood pressure is 150/104. Says it's running about 140 or better systolically at home.    Review of Systems     Objective:   Physical Exam Skin is warm and dry. Nodes none. Chest clear to auscultation. Cardiac exam regular rate and rhythm normal S1 and S2. Extremities without edema.        Assessment & Plan:  Hypertension  Plan: Patient will take losartan 100 mg daily plus HCTZ 25 mg daily. She will stop metoprolol. Will return in 2-3 weeks. Admits to being under some stress due to being shorthanded at work. She will need basic metabolic panel when she returns. Serum potassium checked today.

## 2013-05-19 ENCOUNTER — Encounter: Payer: Self-pay | Admitting: Internal Medicine

## 2013-05-19 ENCOUNTER — Ambulatory Visit (INDEPENDENT_AMBULATORY_CARE_PROVIDER_SITE_OTHER): Payer: 59 | Admitting: Internal Medicine

## 2013-05-19 VITALS — BP 132/86 | HR 68 | Wt 150.0 lb

## 2013-05-19 DIAGNOSIS — I10 Essential (primary) hypertension: Secondary | ICD-10-CM

## 2013-05-19 LAB — BASIC METABOLIC PANEL
BUN: 16 mg/dL (ref 6–23)
CO2: 27 mEq/L (ref 19–32)
Calcium: 9.7 mg/dL (ref 8.4–10.5)
Chloride: 103 mEq/L (ref 96–112)
Creat: 0.7 mg/dL (ref 0.50–1.10)
Glucose, Bld: 106 mg/dL — ABNORMAL HIGH (ref 70–99)
Potassium: 3.9 mEq/L (ref 3.5–5.3)
Sodium: 140 mEq/L (ref 135–145)

## 2013-05-19 NOTE — Patient Instructions (Addendum)
Purchased home blood pressure monitoring keep an eye on her blood pressure. Call me with some readings. If persistently elevated please let me know. Otherwise continue with Hyzaar 100/25 daily

## 2013-05-19 NOTE — Progress Notes (Signed)
  Subjective:    Patient ID: Virginia [name], female    DOB: February 17, 1956, 57 y.o.   MRN: [id number]  HPI At last visit, beta blocker was stopped and she was placed on losartan/HCTZ 100/25 daily. She's tolerating this medication fine. No palpitations off beta blocker. Blood pressure has improved considerably. She's getting some help at work and that should decrease some of her stress.    Review of Systems     Objective:   Physical Exam Chest clear to auscultation. Cardiac exam regular rate and rhythm. Extremities without edema.        Assessment & Plan:  Essential hypertension  Plan: Patient will purchase a home blood pressure monitor. Prescription given. She will keep an eye on her blood pressure and will let  know if it creeps up otherwise I'll see her again in January 2015 for six-month recheck. For now leave on losartan HCTZ 100/25 daily.

## 2013-07-17 ENCOUNTER — Other Ambulatory Visit: Payer: Self-pay | Admitting: Internal Medicine

## 2013-09-13 NOTE — Progress Notes (Signed)
   Subjective:    Patient ID: Virginia [name], female    DOB: [birth year], 58 y.o.   MRN: [id number]  HPI  58 year old female in today for health maintenance exam. She has a history of anxiety, smoking, history of hyperlipidemia, history of hypertension.  No known drug allergies  History of bilateral tubal ligation 1995, D&C in January 2000, vaginal hysterectomy July 2000, laparoscopic cholecystectomy January 2002. GYN physician was Dr. Warnell Forester who is now deceased.  History of asthmatic bronchitis, history of hematuria seen by urologist with cystoscopy that was negative in June 2009.  History of palpitations treated with Lopressor.  Social history: She is married. She had her husband operate a Haematologist. She has smoked for over 20 years. Social alcohol consumption. 3 adult children, 2 daughters and a son  Family history: Father died at age 52 of an MI. One sister died of complications of HIV. Another sister died of cancer. One sister living. One brother living.  Patient had evaluation for chest pain and 2013 showing an incomplete right bundle branch block with T wave inversion in leads V2 and V3.  History of GE reflux.  History of left wrist fracture at age 69    Review of Systems  Cardiovascular:       Palpitations controlled with Lopressor  Gastrointestinal:       History of GE reflux  Psychiatric/Behavioral:       History of anxiety  All other systems reviewed and are negative.       Objective:   Physical Exam  Vitals reviewed. Constitutional: She is oriented to person, place, and time. She appears well-developed and well-nourished. No distress.  HENT:  Head: Normocephalic and atraumatic.  Right Ear: External ear normal.  Left Ear: External ear normal.  Mouth/Throat: Oropharynx is clear and moist. No oropharyngeal exudate.  Eyes: Conjunctivae and EOM are normal. Pupils are equal, round, and reactive to light. Right eye exhibits no discharge. Left eye exhibits no  discharge. No scleral icterus.  Neck: Neck supple. No JVD present. No thyromegaly present.  Cardiovascular: Normal rate, regular rhythm, normal heart sounds and intact distal pulses.   No murmur heard. Pulmonary/Chest: Effort normal and breath sounds normal. No respiratory distress. She has no rales. She exhibits no tenderness.  Breasts normal female  Abdominal: Soft. Bowel sounds are normal. She exhibits no distension and no mass. There is no tenderness. There is no rebound and no guarding.  Genitourinary: No vaginal discharge found.  Pap taken of vaginal cuff. Bimanual is normal.  Musculoskeletal: Normal range of motion. She exhibits no edema.  Lymphadenopathy:    She has no cervical adenopathy.  Neurological: She is alert and oriented to person, place, and time. She has normal reflexes. Coordination normal.  Skin: Skin is warm and dry. No rash noted. She is not diaphoretic.  Psychiatric: She has a normal mood and affect. Her behavior is normal. Judgment and thought content normal.          Assessment & Plan:  Hypertension-stable  Elevated triglycerides at 200 based on lab work done April 2014  History of palpitations-controlled with Lopressor  History smoking-smoking cessation discussed  History of anxiety-treated with Xanax  Microscopic hematuria-previously evaluated by urologist . Urine culture sent but culture showed insignificant growth. Patient asymptomatic. 7-10 red blood cells per high-powered field.  Plan: Continue same medications and return in 6 months. Needs to have screening colonoscopy. Recommend annual mammogram.

## 2013-09-13 NOTE — Patient Instructions (Signed)
Continue same medications and return in 6 months. Watch diet. Quit smoking. Consider colonoscopy.

## 2013-09-18 ENCOUNTER — Other Ambulatory Visit: Payer: Self-pay | Admitting: Internal Medicine

## 2013-09-22 ENCOUNTER — Encounter: Payer: Self-pay | Admitting: Internal Medicine

## 2013-09-22 ENCOUNTER — Ambulatory Visit (INDEPENDENT_AMBULATORY_CARE_PROVIDER_SITE_OTHER): Payer: 59 | Admitting: Internal Medicine

## 2013-09-22 VITALS — BP 140/90 | HR 80 | Temp 98.3°F | Wt 153.0 lb

## 2013-09-22 DIAGNOSIS — Z23 Encounter for immunization: Secondary | ICD-10-CM

## 2013-09-22 MED ORDER — TETANUS-DIPHTH-ACELL PERTUSSIS 5-2.5-18.5 LF-MCG/0.5 IM SUSP: 0.5000 mL | Freq: Once | INTRAMUSCULAR | Status: DC

## 2013-09-22 NOTE — Progress Notes (Signed)
   Subjective:    Patient ID: Virginia [name], female    DOB: [birth year], 58 y.o.   MRN: [id number]  HPI  She is in today to followup on hypertension. She had an argument with her husband last night and slept in a separate bedroom. Did not sleep well last evening. Blood pressure is elevated today. She was given previously a prescription for home blood pressure monitor but did not purchase one. She was also given a prescription to have mammogram but has not made the appointment. Reminded her to do so.    Review of Systems     Objective:   Physical Exam skin is warm and dry. Nodes none. No JVD thyromegaly or carotid bruits. Chest clear to auscultation. Cardiac exam regular rate and rhythm. Extremities without edema.        Assessment & Plan:  Hypertension   Situational stress-argument with husband last evening about children  Plan: Patient to purchase home blood pressure monitoring to keep blood pressures daily for the next 2 weeks and call me with results. Otherwise see in 6 months for physical examination. She needs to have a mammogram and has been given a prescription to have that done but has yet to make the appointment. Declines influenza immunization. Tetanus immunization update given today. Discussed colonoscopy.

## 2013-09-22 NOTE — Patient Instructions (Signed)
Purchase home blood pressure monitor and call with blood pressure readings in 2 weeks. Prescription given for home blood pressure monitor. Have mammogram. Tetanus immunization given today. Flu vaccine declined.

## 2013-10-12 ENCOUNTER — Ambulatory Visit
Admission: RE | Admit: 2013-10-12 | Discharge: 2013-10-12 | Disposition: A | Discharge status: Home / Self Care | Payer: Self-pay | Source: Ambulatory Visit | Attending: Internal Medicine | Admitting: Internal Medicine

## 2013-10-12 DIAGNOSIS — N644 Mastodynia: Secondary | ICD-10-CM

## 2013-10-12 DIAGNOSIS — Z780 Asymptomatic menopausal state: Secondary | ICD-10-CM

## 2013-10-21 ENCOUNTER — Other Ambulatory Visit: Payer: Self-pay | Admitting: Internal Medicine

## 2013-10-21 NOTE — Telephone Encounter (Signed)
Refill x 6 months 

## 2013-12-10 ENCOUNTER — Encounter: Payer: Self-pay | Admitting: Internal Medicine

## 2013-12-10 ENCOUNTER — Ambulatory Visit (INDEPENDENT_AMBULATORY_CARE_PROVIDER_SITE_OTHER): Payer: 59 | Admitting: Internal Medicine

## 2013-12-10 VITALS — BP 126/86 | HR 80 | Temp 99.6°F | Wt 150.0 lb

## 2013-12-10 DIAGNOSIS — H6690 Otitis media, unspecified, unspecified ear: Secondary | ICD-10-CM

## 2013-12-10 DIAGNOSIS — H6693 Otitis media, unspecified, bilateral: Secondary | ICD-10-CM

## 2013-12-10 DIAGNOSIS — J019 Acute sinusitis, unspecified: Secondary | ICD-10-CM

## 2013-12-10 MED ORDER — HYDROCODONE-HOMATROPINE 5-1.5 MG/5ML PO SYRP: 5.0000 mL | ORAL_SOLUTION | Freq: Three times a day (TID) | ORAL | Status: DC | PRN

## 2013-12-10 MED ORDER — CLARITHROMYCIN 500 MG PO TABS: 500.0000 mg | ORAL_TABLET | Freq: Two times a day (BID) | ORAL | Status: DC

## 2013-12-10 MED ORDER — METHYLPREDNISOLONE ACETATE 80 MG/ML IJ SUSP
80.0000 mg | Freq: Once | INTRAMUSCULAR | Status: AC
  Administered 2013-12-10: 80 mg via INTRAMUSCULAR

## 2013-12-10 NOTE — Progress Notes (Signed)
   Subjective:    Patient ID: Virginia [name], female    DOB: 08-01-1956, 58 y.o.   MRN: [id number]  HPI  Two-week history of URI symptoms. Has had congestion and ear stuffiness. Ears have been itchy. She thought initially it could be allergies but it has lingered.    Review of Systems     Objective:   Physical Exam TMs are full bilaterally. Pharynx is slightly injected. Sounds nasally congested. Anterior cervical nodes bilaterally. Chest clear to auscultation.       Assessment & Plan:  Bilateral otitis media  Sinusitis  Hypertension-stable on current regimen  Plan: Depo-Medrol 80 mg IM. Biaxin 500 mg twice daily for 10 days. Hycodan 4 ounces 1 teaspoon by mouth Q8 hours when necessary cough.

## 2013-12-10 NOTE — Patient Instructions (Signed)
Take Biaxin 500 mg twice daily as directed for 10 days. Take Hycodan as needed for cough. Depomedrol IM given

## 2013-12-10 NOTE — Addendum Note (Signed)
Addended by: Brett Canales on: 12/10/2013 12:33 PM   Modules accepted: Orders

## 2014-01-14 ENCOUNTER — Other Ambulatory Visit: Payer: Self-pay | Admitting: Internal Medicine

## 2014-01-28 ENCOUNTER — Ambulatory Visit (INDEPENDENT_AMBULATORY_CARE_PROVIDER_SITE_OTHER): Payer: 59 | Admitting: Internal Medicine

## 2014-01-28 ENCOUNTER — Encounter: Payer: Self-pay | Admitting: Internal Medicine

## 2014-01-28 VITALS — BP 136/94 | HR 76 | Temp 98.1°F | Wt 148.0 lb

## 2014-01-28 DIAGNOSIS — J069 Acute upper respiratory infection, unspecified: Secondary | ICD-10-CM

## 2014-01-28 MED ORDER — HYDROCODONE-HOMATROPINE 5-1.5 MG/5ML PO SYRP: 5.0000 mL | ORAL_SOLUTION | Freq: Three times a day (TID) | ORAL | Status: DC | PRN

## 2014-01-28 MED ORDER — CLARITHROMYCIN 500 MG PO TABS: 500.0000 mg | ORAL_TABLET | Freq: Two times a day (BID) | ORAL | Status: DC

## 2014-01-28 NOTE — Patient Instructions (Signed)
Take Biaxin 500 mg bid x 10 days. Take Hycodan for cough.

## 2014-01-28 NOTE — Progress Notes (Signed)
   Subjective:    Patient ID: Virginia [name], female    DOB: 12/27/1955, 58 y.o.   MRN: [id number]  HPI URI symptoms onset after beach trip. Sore throat, ear pain, and cough. Yellow nasal discharge. No documented fever. Has malaise. Others at work are ill.    Review of Systems     Objective:   Physical Exam Pharynx is injected without exudate. TMs are okay. Neck is supple. Chest clear to auscultation. Cough is congested.       Assessment & Plan:  Acute URI  Plan: Biaxin 500 mg twice daily for 10 days. Hycodan 8 ounces 1 teaspoon by mouth Q8 hours when necessary cough no refill. Had similar illness April 2. See previous note.

## 2014-02-08 ENCOUNTER — Other Ambulatory Visit: Payer: Self-pay | Admitting: Dermatology

## 2014-03-27 ENCOUNTER — Other Ambulatory Visit: Payer: Self-pay | Admitting: Internal Medicine

## 2014-03-30 ENCOUNTER — Other Ambulatory Visit: Payer: 59 | Admitting: Internal Medicine

## 2014-03-30 DIAGNOSIS — I10 Essential (primary) hypertension: Secondary | ICD-10-CM

## 2014-03-30 DIAGNOSIS — Z130 Encounter for screening for diseases of the blood and blood-forming organs and certain disorders involving the immune mechanism: Secondary | ICD-10-CM

## 2014-03-30 DIAGNOSIS — E785 Hyperlipidemia, unspecified: Secondary | ICD-10-CM

## 2014-03-30 DIAGNOSIS — Z79899 Other long term (current) drug therapy: Secondary | ICD-10-CM

## 2014-03-30 DIAGNOSIS — Z13228 Encounter for screening for other metabolic disorders: Secondary | ICD-10-CM

## 2014-03-30 DIAGNOSIS — Z1329 Encounter for screening for other suspected endocrine disorder: Secondary | ICD-10-CM

## 2014-03-30 LAB — CBC WITH DIFFERENTIAL/PLATELET
Basophils Absolute: 0.1 10*3/uL (ref 0.0–0.1)
Basophils Relative: 1 % (ref 0–1)
Eosinophils Absolute: 0.3 10*3/uL (ref 0.0–0.7)
Eosinophils Relative: 4 % (ref 0–5)
HCT: 36.9 % (ref 36.0–46.0)
Hemoglobin: 12.8 g/dL (ref 12.0–15.0)
Lymphocytes Relative: 47 % — ABNORMAL HIGH (ref 12–46)
Lymphs Abs: 3.1 10*3/uL (ref 0.7–4.0)
MCH: 31.8 pg (ref 26.0–34.0)
MCHC: 34.7 g/dL (ref 30.0–36.0)
MCV: 91.8 fL (ref 78.0–100.0)
Monocytes Absolute: 0.5 10*3/uL (ref 0.1–1.0)
Monocytes Relative: 8 % (ref 3–12)
Neutro Abs: 2.6 10*3/uL (ref 1.7–7.7)
Neutrophils Relative %: 40 % — ABNORMAL LOW (ref 43–77)
Platelets: 344 10*3/uL (ref 150–400)
RBC: 4.02 MIL/uL (ref 3.87–5.11)
RDW: 13.9 % (ref 11.5–15.5)
WBC: 6.6 10*3/uL (ref 4.0–10.5)

## 2014-03-30 LAB — COMPREHENSIVE METABOLIC PANEL
ALT: 18 U/L (ref 0–35)
AST: 16 U/L (ref 0–37)
Albumin: 3.9 g/dL (ref 3.5–5.2)
Alkaline Phosphatase: 69 U/L (ref 39–117)
BUN: 11 mg/dL (ref 6–23)
CO2: 27 mEq/L (ref 19–32)
Calcium: 9.3 mg/dL (ref 8.4–10.5)
Chloride: 102 mEq/L (ref 96–112)
Creat: 0.6 mg/dL (ref 0.50–1.10)
Glucose, Bld: 106 mg/dL — ABNORMAL HIGH (ref 70–99)
Potassium: 4.4 mEq/L (ref 3.5–5.3)
Sodium: 140 mEq/L (ref 135–145)
Total Bilirubin: 0.6 mg/dL (ref 0.2–1.2)
Total Protein: 6.4 g/dL (ref 6.0–8.3)

## 2014-03-30 LAB — LIPID PANEL
Cholesterol: 213 mg/dL — ABNORMAL HIGH (ref 0–200)
HDL: 95 mg/dL (ref >39)
LDL Cholesterol: 92 mg/dL (ref 0–99)
Total CHOL/HDL Ratio: 2.2 Ratio
Triglycerides: 132 mg/dL (ref <150)
VLDL: 26 mg/dL (ref 0–40)

## 2014-03-30 LAB — TSH: TSH: 2.04 u[IU]/mL (ref 0.350–4.500)

## 2014-03-31 LAB — VITAMIN D 25 HYDROXY (VIT D DEFICIENCY, FRACTURES): Vit D, 25-Hydroxy: 43 ng/mL (ref 30–89)

## 2014-04-01 ENCOUNTER — Encounter: Payer: Self-pay | Admitting: Internal Medicine

## 2014-04-01 ENCOUNTER — Ambulatory Visit (INDEPENDENT_AMBULATORY_CARE_PROVIDER_SITE_OTHER): Payer: 59 | Admitting: Internal Medicine

## 2014-04-01 VITALS — BP 110/80 | HR 76 | Temp 98.3°F | Ht 63.0 in | Wt 149.5 lb

## 2014-04-01 DIAGNOSIS — Z87891 Personal history of nicotine dependence: Secondary | ICD-10-CM

## 2014-04-01 DIAGNOSIS — R319 Hematuria, unspecified: Secondary | ICD-10-CM

## 2014-04-01 DIAGNOSIS — Z87898 Personal history of other specified conditions: Secondary | ICD-10-CM

## 2014-04-01 DIAGNOSIS — F411 Generalized anxiety disorder: Secondary | ICD-10-CM

## 2014-04-01 DIAGNOSIS — Z8679 Personal history of other diseases of the circulatory system: Secondary | ICD-10-CM

## 2014-04-01 DIAGNOSIS — I10 Essential (primary) hypertension: Secondary | ICD-10-CM

## 2014-04-01 DIAGNOSIS — Z0000 Encounter for general adult medical examination without abnormal findings: Secondary | ICD-10-CM

## 2014-04-01 DIAGNOSIS — K219 Gastro-esophageal reflux disease without esophagitis: Secondary | ICD-10-CM

## 2014-04-01 DIAGNOSIS — R3129 Other microscopic hematuria: Secondary | ICD-10-CM | POA: Insufficient documentation

## 2014-04-01 DIAGNOSIS — E781 Pure hyperglyceridemia: Secondary | ICD-10-CM

## 2014-04-01 LAB — POCT URINALYSIS DIPSTICK
Bilirubin, UA: NEGATIVE
Glucose, UA: NEGATIVE
Ketones, UA: NEGATIVE
Leukocytes, UA: NEGATIVE
Nitrite, UA: NEGATIVE
Protein, UA: NEGATIVE
Spec Grav, UA: 1.02
Urobilinogen, UA: NEGATIVE
pH, UA: 6.5

## 2014-04-01 NOTE — Progress Notes (Signed)
Subjective:    Patient ID: Virginia [name], female    DOB: 1956/07/07, 58 y.o.   MRN: [id number]  HPI  58 year old white female in today for health maintenance exam. Has history of hypertension. Urinalysis shows 1+ occult blood. It will be sent for culture and microscopic evaluation. Has history of microscopic hematuria evaluated by Dr. Karsten Ro  In 2009 and found to be benign. However she continued to smoke since 2009. She recently quit but smoked a cigarette last night and feels bad about it. She has a history of anxiety. Has been working out recently 5 days a week. Feels much better. Has more energy.  History of hyperlipidemia.  No known drug allergies  Past medical history: Bilateral tubal ligation 1995, D&C in January 2000, vaginal hysterectomy July 2000, laparoscopic cholecystectomy January 2002. History of asthmatic bronchitis. History of palpitations treated with Lopressor. Patient had evaluation for chest pain and 2013 showing an incomplete right bundle branch block with T-wave inversions in leads V2 and V3. History of GE reflux. History of left wrist fracture at age 80.  Social history: She is married. 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 She has smoked for over 20 years. Social alcohol consumption. 3 adult children, 2 daughters and a son.  Family history: Father died at age 40 of an MI. One sister died of complications of HIV. Another sister died of cancer. One sister living. One brother living.    Review of Systems  Respiratory:       History of smoking  Cardiovascular:       History of palpitations treated with beta blocker  Gastrointestinal:       GE reflux  Psychiatric/Behavioral:       Anxiety       Objective:   Physical Exam  Vitals reviewed. Constitutional: She is oriented to person, place, and time. She appears well-developed and well-nourished. No distress.  HENT:  Head: Normocephalic and atraumatic.  Right Ear: External ear normal.  Left Ear:  External ear normal.  Mouth/Throat: Oropharynx is clear and moist. No oropharyngeal exudate.  Eyes: Conjunctivae and EOM are normal. Pupils are equal, round, and reactive to light. Right eye exhibits no discharge. Left eye exhibits no discharge. No scleral icterus.  Neck: No JVD present. No thyromegaly present.  Cardiovascular: Normal rate, regular rhythm, normal heart sounds and intact distal pulses.  Exam reveals no gallop.   No murmur heard. Pulmonary/Chest: Effort normal and breath sounds normal. No respiratory distress. She has no wheezes. She has no rales. She exhibits no tenderness.  Breasts normal female  Abdominal: Bowel sounds are normal. She exhibits no distension and no mass. There is no rebound.  Genitourinary:  Pap of vaginal cuff taken in 2014. Bimanual normal. Vaginal hysterectomy  Musculoskeletal: Normal range of motion. She exhibits no edema.  Lymphadenopathy:    She has no cervical adenopathy.  Neurological: She is alert and oriented to person, place, and time. She has normal reflexes. She displays normal reflexes. No cranial nerve deficit.  Skin: Skin is warm and dry. No rash noted. She is not diaphoretic.  Psychiatric: She has a normal mood and affect. Her behavior is normal. Judgment and thought content normal.          Assessment & Plan:  History smoking-patient reminded to continue efforts to quit  GE reflux-stable at PPI  History of anxiety-stable with Xanax  History of palpitations-stable with Lopressor  Hypertension-stable  History of microscopic hematuria evaluated by urologist with negative cystoscopy  June 2009. Continues to show occult blood and urine. History of smoking. Refer back to urologist for further evaluation.  History of hypertriglyceridemia-recent lipid panel shows triglycerides to be normal  Plan: Continue same medications and return in 6 months

## 2014-04-01 NOTE — Patient Instructions (Signed)
Continue to monitor your blood pressure at home. Call if persistently elevated. Return in 6 months for office visit blood pressure check. We will make referral to [facility] for evaluation of microscopic hematuria. Please stop smoking.

## 2014-04-02 LAB — URINALYSIS, ROUTINE W REFLEX MICROSCOPIC
Bilirubin Urine: NEGATIVE
Glucose, UA: NEGATIVE mg/dL
Ketones, ur: NEGATIVE mg/dL
Leukocytes, UA: NEGATIVE
Nitrite: NEGATIVE
Protein, ur: NEGATIVE mg/dL
Specific Gravity, Urine: 1.01 (ref 1.005–1.030)
Urobilinogen, UA: 0.2 mg/dL (ref 0.0–1.0)
pH: 6.5 (ref 5.0–8.0)

## 2014-04-02 LAB — URINALYSIS, MICROSCOPIC ONLY
Bacteria, UA: NONE SEEN
Casts: NONE SEEN
Crystals: NONE SEEN
Squamous Epithelial / LPF: NONE SEEN

## 2014-04-02 LAB — URINE CULTURE
Colony Count: NO GROWTH
Organism ID, Bacteria: NO GROWTH

## 2014-04-02 NOTE — Progress Notes (Signed)
Patient has been scheduled for an appointment with Dr. Karsten Ro on 04/27/2014 at 8:15 am. This will be e-mailed to her at her request. Informed of labs.

## 2014-05-06 ENCOUNTER — Other Ambulatory Visit: Payer: Self-pay | Admitting: Internal Medicine

## 2014-06-04 ENCOUNTER — Other Ambulatory Visit: Payer: Self-pay | Admitting: Internal Medicine

## 2014-06-04 NOTE — Telephone Encounter (Signed)
Please check with pharmacy. I thought we already gave approval to refill x 6 months recently

## 2014-06-04 NOTE — Telephone Encounter (Signed)
Called xanax refill to pharmacy.

## 2014-07-27 ENCOUNTER — Ambulatory Visit (INDEPENDENT_AMBULATORY_CARE_PROVIDER_SITE_OTHER): Payer: 59 | Admitting: Internal Medicine

## 2014-07-27 ENCOUNTER — Other Ambulatory Visit: Payer: Self-pay | Admitting: Internal Medicine

## 2014-07-27 ENCOUNTER — Encounter: Payer: Self-pay | Admitting: Internal Medicine

## 2014-07-27 VITALS — BP 128/82 | HR 84 | Temp 97.8°F | Wt 150.0 lb

## 2014-07-27 DIAGNOSIS — J209 Acute bronchitis, unspecified: Secondary | ICD-10-CM

## 2014-07-27 DIAGNOSIS — J018 Other acute sinusitis: Secondary | ICD-10-CM

## 2014-07-27 DIAGNOSIS — H6503 Acute serous otitis media, bilateral: Secondary | ICD-10-CM

## 2014-07-27 MED ORDER — PREDNISONE 10 MG PO TABS: ORAL_TABLET | ORAL | Status: DC

## 2014-07-27 MED ORDER — CLARITHROMYCIN 500 MG PO TABS: 500.0000 mg | ORAL_TABLET | Freq: Two times a day (BID) | ORAL | Status: DC

## 2014-07-27 MED ORDER — HYDROCODONE-HOMATROPINE 5-1.5 MG/5ML PO SYRP: 5.0000 mL | ORAL_SOLUTION | Freq: Three times a day (TID) | ORAL | Status: DC | PRN

## 2014-07-27 NOTE — Progress Notes (Signed)
   Subjective:    Patient ID: Virginia [name], female    DOB: [birth year], 58 y.o.   MRN: [id number]  HPI  History of tickle in right throat for 2 months. Does not feel that she's having reflux symptoms. Recently developed URI symptoms which she says she caught from a coworker. Husband has similar illness. Leaving for [place] for a vacation in 2 days.no fever or shaking chills. Beginning to have cough with discolored sputum production and discolored nasal drainage.    Review of Systems     Objective:   Physical Exam  Skin warm and dry. No thyromegaly. No adenopathy. TMs are full bilaterally. Neck is supple. Chest clear to auscultation. Pharynx very slightly injected without exudate or abnormality.      Assessment & Plan:  Acute bilateral otitis media  Sinusitis  Bronchitis  Plan: Hycodan 1 teaspoon by mouth every 6-8 hours when necessary cough. Biaxin 500 mg twice daily for 10 days. Sterapred DS 10 mg 6 day dosepak.

## 2014-07-27 NOTE — Patient Instructions (Signed)
Take Biaxin 500 mg twice daily. Take Prednisone as directed. Take Hycodan as directed.

## 2014-09-24 ENCOUNTER — Encounter: Payer: Self-pay | Admitting: Internal Medicine

## 2014-10-07 ENCOUNTER — Encounter: Payer: Self-pay | Admitting: Internal Medicine

## 2014-10-07 ENCOUNTER — Ambulatory Visit (INDEPENDENT_AMBULATORY_CARE_PROVIDER_SITE_OTHER): Payer: 59 | Admitting: Internal Medicine

## 2014-10-07 VITALS — BP 122/84 | HR 72 | Temp 97.4°F | Wt 154.0 lb

## 2014-10-07 DIAGNOSIS — I10 Essential (primary) hypertension: Secondary | ICD-10-CM

## 2014-10-07 DIAGNOSIS — F411 Generalized anxiety disorder: Secondary | ICD-10-CM

## 2014-10-10 ENCOUNTER — Encounter: Payer: Self-pay | Admitting: Internal Medicine

## 2014-10-10 NOTE — Progress Notes (Signed)
   Subjective:    Patient ID: Virginia [name], female    DOB: 05/30/1956, 59 y.o.   MRN: [id number]  HPI  Patient in today for six-month recheck on hypertension, generalized anxiety disorder, GE reflux. Doing well for the most part with no new complaints. Recently found out husband is going to need hip replacement. 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 History of anxiety treated with Xanax. Takes PPI for GE reflux.    Review of Systems     Objective:   Physical Exam  Neck is supple without JVD thyromegaly carotid bruits. Chest clear. Cardiac exam regular rate and rhythm normal S1 and S2. Extremities without edema      Assessment & Plan:  Essential hypertension  Generalized anxiety disorder  GE reflux  Plan: Continue same medications and return in 6 months for physical exam

## 2014-10-10 NOTE — Patient Instructions (Signed)
Continue same medications and return for physical exam in 6 months

## 2014-12-05 ENCOUNTER — Other Ambulatory Visit: Payer: Self-pay | Admitting: Internal Medicine

## 2015-01-30 ENCOUNTER — Other Ambulatory Visit: Payer: Self-pay | Admitting: Internal Medicine

## 2015-02-06 ENCOUNTER — Other Ambulatory Visit: Payer: Self-pay | Admitting: Internal Medicine

## 2015-02-06 NOTE — Telephone Encounter (Signed)
Refill x 3 months 

## 2015-02-15 ENCOUNTER — Other Ambulatory Visit: Payer: Self-pay

## 2015-02-15 DIAGNOSIS — Z1231 Encounter for screening mammogram for malignant neoplasm of breast: Secondary | ICD-10-CM

## 2015-02-23 ENCOUNTER — Encounter (INDEPENDENT_AMBULATORY_CARE_PROVIDER_SITE_OTHER): Payer: Self-pay

## 2015-02-23 ENCOUNTER — Ambulatory Visit
Admission: RE | Admit: 2015-02-23 | Discharge: 2015-02-23 | Disposition: A | Discharge status: Home / Self Care | Payer: 59 | Source: Ambulatory Visit

## 2015-02-23 DIAGNOSIS — Z1231 Encounter for screening mammogram for malignant neoplasm of breast: Secondary | ICD-10-CM

## 2015-02-25 ENCOUNTER — Other Ambulatory Visit: Payer: Self-pay | Admitting: Internal Medicine

## 2015-02-25 DIAGNOSIS — R928 Other abnormal and inconclusive findings on diagnostic imaging of breast: Secondary | ICD-10-CM

## 2015-03-03 ENCOUNTER — Other Ambulatory Visit: Payer: 59

## 2015-03-03 ENCOUNTER — Inpatient Hospital Stay: Admission: RE | Admit: 2015-03-03 | Payer: 59 | Source: Ambulatory Visit

## 2015-03-21 ENCOUNTER — Ambulatory Visit (INDEPENDENT_AMBULATORY_CARE_PROVIDER_SITE_OTHER): Payer: Commercial Managed Care - HMO | Admitting: Internal Medicine

## 2015-03-21 ENCOUNTER — Encounter: Payer: Self-pay | Admitting: Internal Medicine

## 2015-03-21 VITALS — BP 122/72 | HR 68 | Temp 97.8°F | Wt 152.0 lb

## 2015-03-21 DIAGNOSIS — R350 Frequency of micturition: Secondary | ICD-10-CM | POA: Diagnosis not present

## 2015-03-21 DIAGNOSIS — R8290 Unspecified abnormal findings in urine: Secondary | ICD-10-CM

## 2015-03-21 LAB — POCT URINALYSIS DIPSTICK
Bilirubin, UA: NEGATIVE
Glucose, UA: NEGATIVE
Ketones, UA: NEGATIVE
Nitrite, UA: POSITIVE
Protein, UA: NEGATIVE
Spec Grav, UA: 1.015
Urobilinogen, UA: NEGATIVE
pH, UA: 6

## 2015-03-21 MED ORDER — CIPROFLOXACIN HCL 500 MG PO TABS: 500.0000 mg | ORAL_TABLET | Freq: Two times a day (BID) | ORAL | Status: DC

## 2015-03-21 MED ORDER — TRIAMCINOLONE ACETONIDE 0.1 % EX CREA: 1.0000 "application " | TOPICAL_CREAM | Freq: Three times a day (TID) | CUTANEOUS | Status: DC

## 2015-03-21 NOTE — Progress Notes (Signed)
   Subjective:    Patient ID: Virginia [name], female    DOB: 1955/11/29, 59 y.o.   MRN: [id number]  HPI onset yesterday of UTI symptoms. Dysuria but no fever or shaking chills. No nausea or vomiting. No back pain. No recent UTI.  Has quit smoking. I am pleased with that. She wants know if she can come off antihypertensive medication. Blood pressure is excellent today and I think she should remain on anti-hypertensive medication.  She had a tick bite right breast in May and still has of area where bite was. There is no evidence of Lyme disease type rash. It just looks like inflammation from the bite. Will prescribe triamcinolone cream.  Urinalysis is abnormal  Was asked to come back for repeat mammogram with special images recently. This has nothing to do with tick bite.    Review of Systems see above     Objective:   Physical Exam  Culture sent due to abnormal urinalysis. No CVA tenderness. Blood pressure within normal limits. Small area of erythema from surrounding tick bite right lateral breast.      Assessment & Plan:  Tick bite- right breast  Abnormal mammogram-to have further imaging  Hypertension-continue same medication  UTI-acute  Plan: Cipro 500 mg twice daily for 7 days. Culture is pending. Triamcinolone cream 0.1% to tick bite 3 times daily until healed

## 2015-03-21 NOTE — Patient Instructions (Signed)
Use triamcinolone cream on tick bite 3 times daily. Cipro 500 mg twice daily for 7 days for UTI. Continue anti-hypertensive medication.

## 2015-03-24 ENCOUNTER — Telehealth: Payer: Self-pay | Admitting: *Deleted

## 2015-03-24 LAB — URINE CULTURE: Colony Count: >=100000

## 2015-03-24 NOTE — Telephone Encounter (Signed)
Reviewed lab results with patient.

## 2015-04-01 ENCOUNTER — Ambulatory Visit
Admission: RE | Admit: 2015-04-01 | Discharge: 2015-04-01 | Disposition: A | Discharge status: Home / Self Care | Payer: 59 | Source: Ambulatory Visit | Attending: Internal Medicine | Admitting: Internal Medicine

## 2015-04-01 DIAGNOSIS — R928 Other abnormal and inconclusive findings on diagnostic imaging of breast: Secondary | ICD-10-CM

## 2015-04-02 ENCOUNTER — Other Ambulatory Visit: Payer: Self-pay | Admitting: Internal Medicine

## 2015-04-05 ENCOUNTER — Encounter: Payer: Self-pay | Admitting: Internal Medicine

## 2015-04-05 ENCOUNTER — Ambulatory Visit (INDEPENDENT_AMBULATORY_CARE_PROVIDER_SITE_OTHER): Payer: Commercial Managed Care - HMO | Admitting: Internal Medicine

## 2015-04-05 VITALS — BP 118/82 | HR 79 | Temp 97.9°F | Wt 153.0 lb

## 2015-04-05 DIAGNOSIS — R312 Other microscopic hematuria: Secondary | ICD-10-CM | POA: Diagnosis not present

## 2015-04-05 DIAGNOSIS — R8290 Unspecified abnormal findings in urine: Secondary | ICD-10-CM

## 2015-04-05 DIAGNOSIS — N898 Other specified noninflammatory disorders of vagina: Secondary | ICD-10-CM | POA: Diagnosis not present

## 2015-04-05 DIAGNOSIS — R3129 Other microscopic hematuria: Secondary | ICD-10-CM

## 2015-04-05 DIAGNOSIS — R319 Hematuria, unspecified: Secondary | ICD-10-CM

## 2015-04-05 DIAGNOSIS — R350 Frequency of micturition: Secondary | ICD-10-CM | POA: Diagnosis not present

## 2015-04-05 LAB — POCT URINALYSIS DIPSTICK
Bilirubin, UA: NEGATIVE
Glucose, UA: NEGATIVE
Ketones, UA: NEGATIVE
Leukocytes, UA: NEGATIVE
Nitrite, UA: NEGATIVE
Protein, UA: NEGATIVE
Spec Grav, UA: 1.02
Urobilinogen, UA: NEGATIVE
pH, UA: 6

## 2015-04-06 LAB — URINALYSIS, MICROSCOPIC ONLY
Bacteria, UA: NONE SEEN [HPF]
Casts: NONE SEEN [LPF]
Crystals: NONE SEEN [HPF]
Squamous Epithelial / LPF: NONE SEEN [HPF] (ref <=5)
WBC, UA: NONE SEEN WBC/HPF (ref <=5)
Yeast: NONE SEEN [HPF]

## 2015-04-06 NOTE — Progress Notes (Signed)
   Subjective:    Patient ID: Virginia [name], female    DOB: 1956/06/30, 59 y.o.   MRN: [id number]  HPI Patient was seen on July 11 and treated for urinary frequency and urinary tract infection with Cipro. Urine culture grew greater than 100,000 colonies per milliliter of E. coli sensitive to Cipro. Now complaining of a yellow vaginal discharge. No itching. No odor. Says that area just deals a bit irritated. Wonders if UTI cleared up.    Review of Systems     Objective:   Physical Exam  Dipstick urinalysis shows no LE which she had at last visit. She does have a history of hematuria on dipstick and has been evaluated by urologist for microscopic hematuria with negative workup in the past. Apparently she was supposed to return in a year after her initial workup but did not do so. There is a very scant white vaginal discharge without odor. Wet prep is really unremarkable.      Assessment & Plan:  Vaginal irritation-do not see yeast or clue cells to be causing discomfort  Urinalysis-no evidence of acute urinary tract infection but urine has been recultured  Plan: Have recommended vinegar and water douche wants for vaginal discharge. Await results. Have sent urine for microscopic evaluation at lab

## 2015-04-06 NOTE — Patient Instructions (Signed)
Urine has been recultured and sent for urine microscopic. You have a history of microscopic hematuria and  have have had urology workup in the past which is been benign. Recommend vinegar and water douche for vaginal discharge.

## 2015-04-07 ENCOUNTER — Telehealth: Payer: Self-pay | Admitting: *Deleted

## 2015-04-07 LAB — URINE CULTURE

## 2015-04-07 NOTE — Telephone Encounter (Signed)
Reviewed urinalysis results with patient

## 2015-05-05 ENCOUNTER — Ambulatory Visit: Payer: Commercial Managed Care - HMO | Admitting: Internal Medicine

## 2015-05-19 ENCOUNTER — Other Ambulatory Visit: Payer: Self-pay | Admitting: General Surgery

## 2015-06-07 ENCOUNTER — Other Ambulatory Visit: Payer: Self-pay | Admitting: Internal Medicine

## 2015-06-15 ENCOUNTER — Ambulatory Visit (HOSPITAL_COMMUNITY): Admission: RE | Admit: 2015-06-15 | Payer: 59 | Source: Ambulatory Visit | Admitting: General Surgery

## 2015-06-15 ENCOUNTER — Encounter (HOSPITAL_COMMUNITY): Admission: RE | Payer: Self-pay | Source: Ambulatory Visit

## 2015-06-15 SURGERY — EXCISION LIPOMA
Anesthesia: General

## 2015-06-28 ENCOUNTER — Telehealth: Payer: Self-pay | Admitting: Internal Medicine

## 2015-06-28 MED ORDER — LOSARTAN POTASSIUM 100 MG PO TABS: 100.0000 mg | ORAL_TABLET | Freq: Every day | ORAL | Status: DC

## 2015-06-28 MED ORDER — PANTOPRAZOLE SODIUM 40 MG PO TBEC: 40.0000 mg | DELAYED_RELEASE_TABLET | Freq: Every day | ORAL | Status: DC

## 2015-06-28 MED ORDER — HYDROCHLOROTHIAZIDE 25 MG PO TABS: 25.0000 mg | ORAL_TABLET | Freq: Every day | ORAL | Status: DC

## 2015-06-28 NOTE — Telephone Encounter (Signed)
HCTZ, Losartan and protonix escribed.  Patient aware.

## 2015-06-28 NOTE — Telephone Encounter (Signed)
In [place] visiting daughter and she is short on some of her medication.  Wants to know if we will call in some for her.    HCTZ, Losartan and Protonix.  She will be there today through Sunday.  She doesn't want to be without until then.    Pharmacy:  [facility] [address]

## 2015-08-20 ENCOUNTER — Other Ambulatory Visit: Payer: Self-pay | Admitting: Internal Medicine

## 2015-08-21 NOTE — Telephone Encounter (Signed)
Refill x 3 months 

## 2015-08-22 NOTE — Telephone Encounter (Signed)
Phoned to pharmacy 

## 2015-08-30 ENCOUNTER — Encounter: Payer: Self-pay | Admitting: Internal Medicine

## 2015-08-30 ENCOUNTER — Ambulatory Visit (INDEPENDENT_AMBULATORY_CARE_PROVIDER_SITE_OTHER): Payer: Commercial Managed Care - HMO | Admitting: Internal Medicine

## 2015-08-30 VITALS — BP 126/84 | HR 86 | Temp 98.7°F | Resp 20 | Ht 63.0 in | Wt 152.0 lb

## 2015-08-30 DIAGNOSIS — J069 Acute upper respiratory infection, unspecified: Secondary | ICD-10-CM

## 2015-08-30 DIAGNOSIS — H66003 Acute suppurative otitis media without spontaneous rupture of ear drum, bilateral: Secondary | ICD-10-CM

## 2015-08-30 DIAGNOSIS — H6503 Acute serous otitis media, bilateral: Secondary | ICD-10-CM

## 2015-08-30 DIAGNOSIS — J029 Acute pharyngitis, unspecified: Secondary | ICD-10-CM | POA: Diagnosis not present

## 2015-08-30 MED ORDER — HYDROCODONE-HOMATROPINE 5-1.5 MG/5ML PO SYRP: 5.0000 mL | ORAL_SOLUTION | Freq: Three times a day (TID) | ORAL | Status: DC | PRN

## 2015-08-30 MED ORDER — LEVOFLOXACIN 500 MG PO TABS
500.0000 mg | ORAL_TABLET | Freq: Every day | ORAL | Status: DC
Start: 2015-08-30 — End: 2016-01-25

## 2015-08-30 NOTE — Progress Notes (Signed)
   Subjective:    Patient ID: Virginia [name], female    DOB: 12-Oct-1955, 59 y.o.   MRN: [id number]  HPI Onset last week of URI symptoms. Seems to have gotten worse over the past 24 hours. Has company coming for the holidays. Has sore throat cough and congestion. No fever or shaking chills. Some discolored nasal drainage. Complaint of right ear pain. Has malaise and fatigue.    Review of Systems as above     Objective:   Physical Exam  Pharynx is red without exudate. TMs are full bilaterally- right worse than left. Neck is supple without adenopathy. Chest: clear to auscultation without rales or wheezing. Sounds nasally congested. Skin warm and dry.      Assessment & Plan:  Acute URI  Acute pharyngitis  Acute bilateral serous otitis media  Plan: Levaquin 500 milligrams daily for 10 days. Hycodan 1 teaspoon by mouth every 8 hours when necessary cough. This will also help sore throat pain.

## 2015-08-30 NOTE — Patient Instructions (Signed)
Levaquin 500 milligrams daily for 10 days. Hycodan 1 teaspoon by mouth every 6 hours when necessary cough. Call if not better in 7-10 days or sooner if her.

## 2015-09-17 ENCOUNTER — Other Ambulatory Visit: Payer: Self-pay | Admitting: Internal Medicine

## 2015-10-03 DIAGNOSIS — K529 Noninfective gastroenteritis and colitis, unspecified: Secondary | ICD-10-CM

## 2015-10-06 ENCOUNTER — Telehealth: Payer: Self-pay | Admitting: Internal Medicine

## 2015-10-06 MED ORDER — HYDROCORTISONE ACE-PRAMOXINE 1-1 % RE CREA: 1.0000 "application " | TOPICAL_CREAM | Freq: Four times a day (QID) | RECTAL | Status: DC

## 2015-10-06 MED ORDER — PROMETHAZINE HCL 25 MG PO TABS: 25.0000 mg | ORAL_TABLET | ORAL | Status: DC | PRN

## 2015-10-06 NOTE — Telephone Encounter (Signed)
I do not see a note in the chart about anything needing to be called in for upset stomach. Please advise

## 2015-10-06 NOTE — Telephone Encounter (Signed)
Patient stated that the medication that was prescribed for her upset stomach is not at the pharmacy.  Also she wanted to know if something can be prescribed for hemorrhoids other than Preperation H because the Preperation H is not working.

## 2015-10-06 NOTE — Telephone Encounter (Signed)
Call in Phenergan 25 mg tablets #30 one po q 4 hours prn nausea

## 2015-11-29 ENCOUNTER — Other Ambulatory Visit: Payer: Self-pay

## 2015-11-29 MED ORDER — PANTOPRAZOLE SODIUM 40 MG PO TBEC: 40.0000 mg | DELAYED_RELEASE_TABLET | Freq: Every day | ORAL | Status: DC

## 2015-11-29 MED ORDER — LOSARTAN POTASSIUM 100 MG PO TABS: 100.0000 mg | ORAL_TABLET | Freq: Every day | ORAL | Status: DC

## 2016-01-24 ENCOUNTER — Telehealth: Payer: Self-pay | Admitting: Internal Medicine

## 2016-01-24 ENCOUNTER — Ambulatory Visit
Admission: RE | Admit: 2016-01-24 | Discharge: 2016-01-24 | Disposition: A | Discharge status: Home / Self Care | Payer: 59 | Source: Ambulatory Visit | Attending: Internal Medicine | Admitting: Internal Medicine

## 2016-01-24 DIAGNOSIS — S90121A Contusion of right lesser toe(s) without damage to nail, initial encounter: Secondary | ICD-10-CM

## 2016-01-24 NOTE — Telephone Encounter (Signed)
Pt called stating that she hit her toe next to the pinky toe about a week ago. She states that it is badly bruised, throbs at night and that she is still unable to walk on it or wear shoes. Pt wanted to know what Dr. Renold Genta recommended that she do for it or what could be done to make it better. Pt states that she has had it taped up with another toe for the past week. Please advise

## 2016-01-24 NOTE — Telephone Encounter (Signed)
Pt to be seen at 10:45 and have xray prior to appt. Order for xray entered STAT. Patient notified.

## 2016-01-24 NOTE — Telephone Encounter (Signed)
It can be Xrayed to see if broken or displaced. Can see tomorrow. Taping is good. Put cotton between toes when taping to stabilize it. May take 3-4 to heal.

## 2016-01-25 ENCOUNTER — Ambulatory Visit (INDEPENDENT_AMBULATORY_CARE_PROVIDER_SITE_OTHER): Payer: Commercial Managed Care - HMO | Admitting: Internal Medicine

## 2016-01-25 ENCOUNTER — Encounter: Payer: Self-pay | Admitting: Internal Medicine

## 2016-01-25 VITALS — BP 138/88 | HR 74 | Temp 98.3°F | Resp 18 | Ht 62.0 in | Wt 156.5 lb

## 2016-01-25 DIAGNOSIS — S92911A Unspecified fracture of right toe(s), initial encounter for closed fracture: Secondary | ICD-10-CM

## 2016-01-25 MED ORDER — HYDROCODONE-ACETAMINOPHEN 5-325 MG PO TABS: 1.0000 | ORAL_TABLET | Freq: Three times a day (TID) | ORAL | Status: DC

## 2016-01-25 NOTE — Patient Instructions (Signed)
Norco 5 three times daily with food for pain. Ice and elevation. Tape to adjacent toe until healed.

## 2016-01-25 NOTE — Progress Notes (Signed)
   Subjective:    Patient ID: Virginia [name], female    DOB: 1956/07/20, 60 y.o.   MRN: [id number]  HPI Pt was chasing and playing with her dog on Sunday May 7th and struck toe on coffee table. Has had a lot of pain. Did not call  about it until yesterday. Had Xray yesterday which showed spiral nondisplaced fracture right fourth toe.    Review of Systems     Objective:   Physical Exam Nondisplaced but swollen and tender right fourth toe with bruising base of third toe.       Assessment & Plan:   Nondisplaced fracture right fourth toe- spiral fracture    Plan:Take 2 adjacent toe with cotton in between. Ice and elevation. Norco 5/325 one by mouth every 8 hours when necessary pain ( #45) no refill.  Patient says she's going to have lipoma surgery by Dr. Excell Seltzer in June. She's not had a physical exam here since 2015. Advised her that physical exam is due.

## 2016-03-09 ENCOUNTER — Ambulatory Visit (INDEPENDENT_AMBULATORY_CARE_PROVIDER_SITE_OTHER): Payer: Commercial Managed Care - HMO | Admitting: Internal Medicine

## 2016-03-09 ENCOUNTER — Encounter: Payer: Self-pay | Admitting: Internal Medicine

## 2016-03-09 ENCOUNTER — Telehealth: Payer: Self-pay

## 2016-03-09 VITALS — BP 104/66 | HR 67 | Temp 98.3°F | Resp 18 | Wt 156.0 lb

## 2016-03-09 DIAGNOSIS — S92911S Unspecified fracture of right toe(s), sequela: Secondary | ICD-10-CM

## 2016-03-09 DIAGNOSIS — R300 Dysuria: Secondary | ICD-10-CM | POA: Diagnosis not present

## 2016-03-09 DIAGNOSIS — N390 Urinary tract infection, site not specified: Secondary | ICD-10-CM | POA: Diagnosis not present

## 2016-03-09 MED ORDER — CIPROFLOXACIN HCL 500 MG PO TABS: 500.0000 mg | ORAL_TABLET | Freq: Two times a day (BID) | ORAL | Status: DC

## 2016-03-09 NOTE — Patient Instructions (Signed)
Continue to observe right fourth toe. Wait an additional 4 weeks before seeking orthopedic referral. Cipro 500 mg twice daily for 7 days for UTI symptoms. Urine culture pending.

## 2016-03-09 NOTE — Telephone Encounter (Signed)
She can come see me or go to Urgent Care-

## 2016-03-09 NOTE — Telephone Encounter (Signed)
To be seen today at 12:00

## 2016-03-09 NOTE — Telephone Encounter (Signed)
Patient states that she called yesterday and spoke to [place] in regards to some burning during urination. She states that she was advised to get some AZO over the counter, which she did, that did not help. She states that she would like to see if you will call in something for her as she is the only one in the office today and cannot leave. I advised her that you did not normally treat over the phone and considering we would be out of the office next week you would probably want to see her. She states she cannot leave the office as she is the only one there. Please

## 2016-03-09 NOTE — Progress Notes (Signed)
   Subjective:    Patient ID: Virginia [name], female    DOB: 1956/02/12, 60 y.o.   MRN: [id number]  HPI  60 year old Female with UTI symptoms Onset yesterday. Cannot do urine dipstick because she's been taking Azo-Standard. Had diarrhea last week and that may have precipitated UTI. Has dysuria and urgency. No fever or shaking chills.  Still complaining of right fourth toe pain and swelling. Had injury to toe in May and was diagnosed with nondisplaced spiral fracture    Review of Systems     Objective:   Physical Exam  Cannot Perform dipstick of urine  because of AZO-Standard. Culture sent. No CVA tenderness.      Assessment & Plan:  Acute UTI-Culture pending  Spiral fracture-right fourth toe. Still has pain and swelling. Injury occurred in May.  Plan: Continue to observe toe for an additional 4 weeks. Does not want to go to orthopedist right nail. Urine culture pending. Start Cipro 500 mg twice daily for 7 days

## 2016-03-12 LAB — CULTURE, URINE COMPREHENSIVE: Colony Count: >=100000

## 2016-03-16 ENCOUNTER — Telehealth: Payer: Self-pay

## 2016-03-16 ENCOUNTER — Other Ambulatory Visit: Payer: Self-pay

## 2016-03-16 MED ORDER — HYDROCHLOROTHIAZIDE 25 MG PO TABS: 25.0000 mg | ORAL_TABLET | Freq: Every day | ORAL | Status: DC

## 2016-03-16 NOTE — Telephone Encounter (Signed)
Patient contacted office stating she would like a note to be written for her in regards to her toe fracture as she has missed a lot of time at the gym. She states that they are willing to credit her account wit a dr note giving dates. Please advise.

## 2016-03-18 NOTE — Telephone Encounter (Signed)
Please write note given date of fracture until now and I will sign

## 2016-03-19 ENCOUNTER — Other Ambulatory Visit: Payer: Self-pay

## 2016-03-19 MED ORDER — ALPRAZOLAM 0.25 MG PO TABS: ORAL_TABLET | ORAL | Status: DC

## 2016-03-20 ENCOUNTER — Encounter: Payer: Commercial Managed Care - HMO | Admitting: Internal Medicine

## 2016-03-22 ENCOUNTER — Other Ambulatory Visit: Payer: Self-pay | Admitting: Internal Medicine

## 2016-03-22 DIAGNOSIS — Z1231 Encounter for screening mammogram for malignant neoplasm of breast: Secondary | ICD-10-CM

## 2016-03-27 ENCOUNTER — Ambulatory Visit: Payer: 59

## 2016-03-29 ENCOUNTER — Encounter: Payer: Self-pay | Admitting: Internal Medicine

## 2016-03-29 ENCOUNTER — Ambulatory Visit (INDEPENDENT_AMBULATORY_CARE_PROVIDER_SITE_OTHER): Payer: Commercial Managed Care - HMO | Admitting: Internal Medicine

## 2016-03-29 VITALS — BP 128/94 | HR 70 | Temp 97.5°F | Ht 62.0 in | Wt 157.0 lb

## 2016-03-29 DIAGNOSIS — J069 Acute upper respiratory infection, unspecified: Secondary | ICD-10-CM

## 2016-03-29 DIAGNOSIS — H6693 Otitis media, unspecified, bilateral: Secondary | ICD-10-CM | POA: Diagnosis not present

## 2016-03-29 DIAGNOSIS — J029 Acute pharyngitis, unspecified: Secondary | ICD-10-CM

## 2016-03-29 MED ORDER — HYDROCODONE-HOMATROPINE 5-1.5 MG/5ML PO SYRP: 5.0000 mL | ORAL_SOLUTION | Freq: Three times a day (TID) | ORAL | Status: DC | PRN

## 2016-03-29 MED ORDER — METHYLPREDNISOLONE ACETATE 80 MG/ML IJ SUSP
80.0000 mg | Freq: Once | INTRAMUSCULAR | Status: AC
  Administered 2016-03-29: 80 mg via INTRAMUSCULAR

## 2016-03-29 MED ORDER — CLARITHROMYCIN 500 MG PO TABS: 500.0000 mg | ORAL_TABLET | Freq: Two times a day (BID) | ORAL | Status: DC

## 2016-03-29 NOTE — Progress Notes (Signed)
   Subjective:    Patient ID: Virginia [name], female    DOB: 08/10/1956, 60 y.o.   MRN: [id number]  HPI 60 year old Female with 5 day history of nasal and chest congestion. Cough with discolored sputum production, scratchy throat, left ear pain and headache. No documented fever. Has malaise and fatigue.    Review of Systems as above     Objective:   Physical Exam  Skin warm and dry. Nodes none. Neck is supple. Chest clear to auscultation. Cardiac exam regular rate and rhythm. Pharynx slightly injected. Left TM is full but not red, right TM slightly full. Sounds nasally congested. Looks fatigued.      Assessment & Plan:  Left serous otitis media  Pharyngitis  Acute URI  Plan: Depo-Medrol 80 mg IM. Biaxin 500 mg twice daily for 10 days. Hycodan 1 teaspoon by mouth every 12 hours as needed for cough.

## 2016-03-29 NOTE — Patient Instructions (Addendum)
Depo-Medrol 80 mg IM. Biaxin 500 mg twice daily with food for 10 days. Hycodan 1 teaspoon by mouth every 12 hours as needed for cough.

## 2016-04-04 ENCOUNTER — Ambulatory Visit
Admission: RE | Admit: 2016-04-04 | Discharge: 2016-04-04 | Disposition: A | Discharge status: Home / Self Care | Payer: 59 | Source: Ambulatory Visit | Attending: Internal Medicine | Admitting: Internal Medicine

## 2016-04-04 DIAGNOSIS — Z1231 Encounter for screening mammogram for malignant neoplasm of breast: Secondary | ICD-10-CM

## 2016-04-20 ENCOUNTER — Telehealth: Payer: Self-pay | Admitting: Internal Medicine

## 2016-04-20 ENCOUNTER — Other Ambulatory Visit: Payer: Self-pay

## 2016-04-20 MED ORDER — ALPRAZOLAM 0.25 MG PO TABS: ORAL_TABLET | ORAL | 5 refills | Status: DC

## 2016-04-20 NOTE — Telephone Encounter (Signed)
Patient requests a consult to [place] r/t Right 4th toe fracture.  Toe hasn't healed correctly from fracture in May, 2017.    Faxed referral over to [facility].  They faxed back appointment for 04/24/16 @ 2:45 p.m. With Dr. Doran Durand.

## 2016-05-21 ENCOUNTER — Other Ambulatory Visit: Payer: Self-pay

## 2016-05-21 MED ORDER — LOSARTAN POTASSIUM 100 MG PO TABS: 100.0000 mg | ORAL_TABLET | Freq: Every day | ORAL | 1 refills | Status: DC

## 2016-06-08 ENCOUNTER — Other Ambulatory Visit: Payer: Commercial Managed Care - HMO | Admitting: Internal Medicine

## 2016-06-12 ENCOUNTER — Encounter: Payer: Commercial Managed Care - HMO | Admitting: Internal Medicine

## 2016-08-09 ENCOUNTER — Encounter: Payer: Self-pay | Admitting: Internal Medicine

## 2016-08-09 ENCOUNTER — Ambulatory Visit (INDEPENDENT_AMBULATORY_CARE_PROVIDER_SITE_OTHER): Payer: Commercial Managed Care - HMO | Admitting: Internal Medicine

## 2016-08-09 VITALS — BP 112/80 | HR 91 | Temp 98.3°F | Wt 157.0 lb

## 2016-08-09 DIAGNOSIS — J069 Acute upper respiratory infection, unspecified: Secondary | ICD-10-CM

## 2016-08-09 MED ORDER — HYDROCODONE-HOMATROPINE 5-1.5 MG/5ML PO SYRP: 5.0000 mL | ORAL_SOLUTION | Freq: Three times a day (TID) | ORAL | 0 refills | Status: DC | PRN

## 2016-08-09 MED ORDER — CLARITHROMYCIN 500 MG PO TABS: 500.0000 mg | ORAL_TABLET | Freq: Two times a day (BID) | ORAL | 0 refills | Status: DC

## 2016-08-09 NOTE — Progress Notes (Signed)
   Subjective:    Patient ID: Virginia [name], female    DOB: 1955-11-28, 60 y.o.   MRN: [id number]  HPI Patient just got back from trip to [place] with husband. They both came down with respiratory infections. She had some Biaxin that she took on the trip with her and they split what they had. She has been on it for 3 days. They had a leftover bottle of Hycodan they took with him for cough which they've been sharing is well. No fever or shaking chills. Just cough and congestion. Other people that they socialized with for sick is well. No discolored sputum production. Left ear is uncomfortable.    Review of Systems see above     Objective:   Physical Exam  Right TM is full. Left TM is normal. Pharynx slightly injected. Neck is supple without significant adenopathy. Chest clear to auscultation without rales or wheezing      Assessment & Plan:  Acute URI  Plan: Biaxin 500 mg twice daily for 10 days. Hycodan 1 teaspoon by mouth every 12 hours when necessary cough. Rest and drink plenty of fluids. She is never had flu vaccine. Says she will consider taking one when she is better.

## 2016-08-09 NOTE — Patient Instructions (Signed)
Biaxin 500 mg twice daily for 10 days. Hycodan 1 teaspoon by mouth every 12 hours when necessary cough. Rest and drink plenty of fluids. To get flu vaccine in a later date

## 2016-09-10 HISTORY — PX: LIPOMA EXCISION: SHX5283

## 2016-09-11 ENCOUNTER — Other Ambulatory Visit: Payer: Self-pay | Admitting: Internal Medicine

## 2016-09-14 DIAGNOSIS — M7542 Impingement syndrome of left shoulder: Secondary | ICD-10-CM | POA: Diagnosis not present

## 2016-09-14 DIAGNOSIS — M5127 Other intervertebral disc displacement, lumbosacral region: Secondary | ICD-10-CM | POA: Diagnosis not present

## 2016-09-14 DIAGNOSIS — M50223 Other cervical disc displacement at C6-C7 level: Secondary | ICD-10-CM | POA: Diagnosis not present

## 2016-10-02 ENCOUNTER — Telehealth: Payer: Self-pay | Admitting: Internal Medicine

## 2016-10-02 NOTE — Telephone Encounter (Signed)
Patient is calling and states that she is having some discomfort when coughing.  Wants to know if you would order a chest x-ray for her.  States that she smoked for so many years, she is just a little concerned.  She's not having shortness of breath, just seems to sometimes have some wheezing and since her last URI or illness, states that she just doesn't seem to be clear and she's concerned and would like to have a chest x-ray to be certain that nothing's there.    She also scheduled her CPE for March 8th.

## 2016-10-02 NOTE — Telephone Encounter (Signed)
scheduled for 1/25 at 3:45

## 2016-10-02 NOTE — Telephone Encounter (Signed)
Needs OV before doing this

## 2016-10-04 ENCOUNTER — Ambulatory Visit (INDEPENDENT_AMBULATORY_CARE_PROVIDER_SITE_OTHER): Payer: Commercial Managed Care - HMO | Admitting: Internal Medicine

## 2016-10-04 ENCOUNTER — Encounter: Payer: Self-pay | Admitting: Internal Medicine

## 2016-10-04 ENCOUNTER — Ambulatory Visit
Admission: RE | Admit: 2016-10-04 | Discharge: 2016-10-04 | Disposition: A | Discharge status: Home / Self Care | Payer: Commercial Managed Care - HMO | Source: Ambulatory Visit | Attending: Internal Medicine | Admitting: Internal Medicine

## 2016-10-04 VITALS — BP 120/86 | HR 70 | Temp 98.0°F | Ht 63.0 in | Wt 157.0 lb

## 2016-10-04 DIAGNOSIS — R062 Wheezing: Secondary | ICD-10-CM | POA: Diagnosis not present

## 2016-10-04 DIAGNOSIS — R05 Cough: Secondary | ICD-10-CM | POA: Diagnosis not present

## 2016-10-04 NOTE — Progress Notes (Signed)
   Subjective:    Patient ID: Virginia [name], female    DOB: Feb 13, 1956, 61 y.o.   MRN: [id number]  HPI Seen in late November for respiratory infection after trip to [place] where she came down with respiratory infection along with her husband who had similar illness. Was treated with Biaxin for 10 days along with Hycodan.  Says she wheezes when she laughs and it bothers her. She got concerned because she might be changing insurance in the near future and wants to get a chest x-ray.  Currently not smoking. However has over 40 years history of smoking.      Review of Systems see above     Objective:   Physical Exam Neck is supple without adenopathy or thyromegaly. Chest clear to auscultation without rales or wheezing       Assessment & Plan:  Wheezing  Plan: Chest x-ray PA and lateral. Patient does not want to be on inhalers for wheezing. Patient to consider pulmonary function testing if wheezing persistent

## 2016-10-07 NOTE — Patient Instructions (Signed)
Chest x-ray to be ordered for evaluation of wheezing. She may consider pulmonary function testing

## 2016-11-12 ENCOUNTER — Other Ambulatory Visit: Payer: Commercial Managed Care - HMO | Admitting: Internal Medicine

## 2016-11-12 ENCOUNTER — Other Ambulatory Visit: Payer: Self-pay | Admitting: Internal Medicine

## 2016-11-12 DIAGNOSIS — I10 Essential (primary) hypertension: Secondary | ICD-10-CM

## 2016-11-12 DIAGNOSIS — E785 Hyperlipidemia, unspecified: Secondary | ICD-10-CM

## 2016-11-12 DIAGNOSIS — Z1329 Encounter for screening for other suspected endocrine disorder: Secondary | ICD-10-CM

## 2016-11-12 DIAGNOSIS — Z1321 Encounter for screening for nutritional disorder: Secondary | ICD-10-CM

## 2016-11-12 LAB — CBC WITH DIFFERENTIAL/PLATELET
Basophils Absolute: 0 cells/uL (ref 0–200)
Basophils Relative: 0 %
Eosinophils Absolute: 240 cells/uL (ref 15–500)
Eosinophils Relative: 3 %
HCT: 40 % (ref 35.0–45.0)
Hemoglobin: 13.3 g/dL (ref 11.7–15.5)
Lymphocytes Relative: 39 %
Lymphs Abs: 3120 cells/uL (ref 850–3900)
MCH: 31.3 pg (ref 27.0–33.0)
MCHC: 33.3 g/dL (ref 32.0–36.0)
MCV: 94.1 fL (ref 80.0–100.0)
MPV: 8.8 fL (ref 7.5–12.5)
Monocytes Absolute: 720 cells/uL (ref 200–950)
Monocytes Relative: 9 %
Neutro Abs: 3920 cells/uL (ref 1500–7800)
Neutrophils Relative %: 49 %
Platelets: 367 10*3/uL (ref 140–400)
RBC: 4.25 MIL/uL (ref 3.80–5.10)
RDW: 13.2 % (ref 11.0–15.0)
WBC: 8 10*3/uL (ref 3.8–10.8)

## 2016-11-12 LAB — COMPREHENSIVE METABOLIC PANEL
ALT: 22 U/L (ref 6–29)
AST: 16 U/L (ref 10–35)
Albumin: 4.2 g/dL (ref 3.6–5.1)
Alkaline Phosphatase: 82 U/L (ref 33–130)
BUN: 17 mg/dL (ref 7–25)
CO2: 29 mmol/L (ref 20–31)
Calcium: 9.9 mg/dL (ref 8.6–10.4)
Chloride: 102 mmol/L (ref 98–110)
Creat: 0.7 mg/dL (ref 0.50–1.05)
Glucose, Bld: 110 mg/dL — ABNORMAL HIGH (ref 65–99)
Potassium: 4.7 mmol/L (ref 3.5–5.3)
Sodium: 140 mmol/L (ref 135–146)
Total Bilirubin: 0.4 mg/dL (ref 0.2–1.2)
Total Protein: 6.7 g/dL (ref 6.1–8.1)

## 2016-11-12 LAB — LIPID PANEL
Cholesterol: 239 mg/dL — ABNORMAL HIGH (ref <200)
HDL: 91 mg/dL (ref >50)
LDL Cholesterol: 125 mg/dL — ABNORMAL HIGH (ref <100)
Total CHOL/HDL Ratio: 2.6 Ratio (ref <5.0)
Triglycerides: 116 mg/dL (ref <150)
VLDL: 23 mg/dL (ref <30)

## 2016-11-12 LAB — TSH: TSH: 1.71 mIU/L

## 2016-11-13 ENCOUNTER — Other Ambulatory Visit: Payer: Self-pay

## 2016-11-13 LAB — VITAMIN D 25 HYDROXY (VIT D DEFICIENCY, FRACTURES): Vit D, 25-Hydroxy: 31 ng/mL (ref 30–100)

## 2016-11-13 MED ORDER — ALPRAZOLAM 0.25 MG PO TABS: ORAL_TABLET | ORAL | 5 refills | Status: DC

## 2016-11-13 MED ORDER — PANTOPRAZOLE SODIUM 40 MG PO TBEC: 40.0000 mg | DELAYED_RELEASE_TABLET | Freq: Every day | ORAL | 5 refills | Status: DC

## 2016-11-13 MED ORDER — LOSARTAN POTASSIUM 100 MG PO TABS: 100.0000 mg | ORAL_TABLET | Freq: Every day | ORAL | 1 refills | Status: DC

## 2016-11-13 NOTE — Telephone Encounter (Signed)
Refill all x 6 months

## 2016-11-13 NOTE — Telephone Encounter (Signed)
Pt called to request refills. CPE scheduled on 11/15/16, had labs on 11/12/16. Ok to refill?

## 2016-11-15 ENCOUNTER — Encounter: Payer: Self-pay | Admitting: Internal Medicine

## 2016-11-15 ENCOUNTER — Ambulatory Visit (INDEPENDENT_AMBULATORY_CARE_PROVIDER_SITE_OTHER): Payer: Commercial Managed Care - HMO | Admitting: Internal Medicine

## 2016-11-15 VITALS — BP 120/88 | HR 92 | Temp 98.5°F | Ht 62.0 in | Wt 157.0 lb

## 2016-11-15 DIAGNOSIS — R7302 Impaired glucose tolerance (oral): Secondary | ICD-10-CM | POA: Diagnosis not present

## 2016-11-15 DIAGNOSIS — F411 Generalized anxiety disorder: Secondary | ICD-10-CM

## 2016-11-15 DIAGNOSIS — Z87898 Personal history of other specified conditions: Secondary | ICD-10-CM

## 2016-11-15 DIAGNOSIS — Z87891 Personal history of nicotine dependence: Secondary | ICD-10-CM | POA: Diagnosis not present

## 2016-11-15 DIAGNOSIS — I10 Essential (primary) hypertension: Secondary | ICD-10-CM

## 2016-11-15 DIAGNOSIS — R3129 Other microscopic hematuria: Secondary | ICD-10-CM | POA: Diagnosis not present

## 2016-11-15 DIAGNOSIS — Z0000 Encounter for general adult medical examination without abnormal findings: Secondary | ICD-10-CM

## 2016-11-15 DIAGNOSIS — K219 Gastro-esophageal reflux disease without esophagitis: Secondary | ICD-10-CM | POA: Diagnosis not present

## 2016-11-15 LAB — POCT URINALYSIS DIPSTICK
Bilirubin, UA: NEGATIVE
Glucose, UA: NEGATIVE
Ketones, UA: NEGATIVE
Leukocytes, UA: NEGATIVE
Nitrite, UA: NEGATIVE
Protein, UA: NEGATIVE
Spec Grav, UA: <=1.005
Urobilinogen, UA: NEGATIVE
pH, UA: 6

## 2016-11-15 MED ORDER — PANTOPRAZOLE SODIUM 40 MG PO TBEC: 40.0000 mg | DELAYED_RELEASE_TABLET | Freq: Every day | ORAL | 5 refills | Status: DC

## 2016-11-15 MED ORDER — LOSARTAN POTASSIUM 100 MG PO TABS: 100.0000 mg | ORAL_TABLET | Freq: Every day | ORAL | 1 refills | Status: DC

## 2016-11-15 NOTE — Progress Notes (Signed)
   Subjective:    Patient ID: Virginia [name], female    DOB: 07-27-1956, 61 y.o.   MRN: [id number]  HPI  61 year old White FemaleFor health maintenance exam and evaluation of medical issues.   History of hyperlipidemia. History of microscopic hematuria evaluated by Dr. Elenora Gamma in 2009 and and was found to be benign.  History of smoking for over 20 years. She has quit but then resumed smoking at one point. She has a history of anxiety.  No known drug allergies.  Past medical history: Bilateral tubal ligation 1995, D&C January 2000, vaginal hysterectomy July 2000, laparoscopic cholecystectomy January 2002. History of asthmatic bronchitis. History of palpitations treated with Lopressor. Patient had evaluation for chest pain in 2013 show an incomplete right bundle branch block with T-wave inversions in leads V2 and V3. History of GE reflux. History of left wrist fracture age 86.  Social history: She is married. 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 Social alcohol consumption. 3 adult children 2 daughters and a son.  Family history: Father died at age 66 of an MI. One sister died of competitions of HIV. Another sister died of cancer. One sister living. One brother living.  Recently she wanted chest x-ray because she was concerned about lung cancer. Have tried smoking cessation counseling in the past.       Review of Systems     Objective:   Physical Exam  Constitutional: She is oriented to person, place, and time. She appears well-developed and well-nourished. No distress.  HENT:  Head: Normocephalic and atraumatic.  Right Ear: External ear normal.  Left Ear: External ear normal.  Mouth/Throat: Oropharynx is clear and moist.  Eyes: Conjunctivae and EOM are normal. Pupils are equal, round, and reactive to light. Right eye exhibits no discharge. Left eye exhibits no discharge.  Neck: Neck supple. No JVD present. No thyromegaly present.  Cardiovascular: Normal rate and  regular rhythm.   No murmur heard. Pulmonary/Chest: No respiratory distress. She has no wheezes. She has no rales.  Abdominal: Soft. Bowel sounds are normal. She exhibits no distension and no mass. There is no tenderness. There is no rebound and no guarding.  Genitourinary:  Genitourinary Comments: Status post vaginal hysterectomy. Bimanual normal.  Musculoskeletal: She exhibits no edema.  Lymphadenopathy:    She has no cervical adenopathy.  Neurological: She is alert and oriented to person, place, and time. She has normal reflexes. No cranial nerve deficit. Coordination normal.  Skin: Skin is warm and dry. No rash noted. She is not diaphoretic.  Psychiatric: She has a normal mood and affect. Her behavior is normal. Judgment and thought content normal.  Vitals reviewed.         Assessment & Plan:  Normal health maintenance exam  History smoking-talked about quitting  Hyperlipidemia-LDL is elevated at 125 with total cholesterol 239. Triglycerides are normal.  Anxiety treated with Xanax  History of GE reflux  History of palpitations treated with beta blocker  Impaired glucose tolerance-new diagnosis. Refer to dietitian.  Change metoprolol 2 Cozaar 100 mg daily. HCTZ 25 mg daily. Continue generic Protonix for GE reflux. Follow-up in September. Monitor blood pressure at home. Please quit smoking.

## 2016-11-16 ENCOUNTER — Other Ambulatory Visit: Payer: Self-pay | Admitting: Internal Medicine

## 2016-11-16 DIAGNOSIS — R7302 Impaired glucose tolerance (oral): Secondary | ICD-10-CM

## 2016-11-16 LAB — HEMOGLOBIN A1C
Hgb A1c MFr Bld: 6 % — ABNORMAL HIGH (ref <5.7)
Mean Plasma Glucose: 126 mg/dL

## 2016-12-07 NOTE — Patient Instructions (Signed)
Discontinue metoprolol. Take HCTZ and Cozaar. Monitor blood pressure at home. Please stop smoking. See dietitian regarding impaired glucose tolerance. Continue medication for GE reflux. Follow-up September.

## 2016-12-13 DIAGNOSIS — M5127 Other intervertebral disc displacement, lumbosacral region: Secondary | ICD-10-CM | POA: Diagnosis not present

## 2016-12-13 DIAGNOSIS — M50223 Other cervical disc displacement at C6-C7 level: Secondary | ICD-10-CM | POA: Diagnosis not present

## 2016-12-13 DIAGNOSIS — M7542 Impingement syndrome of left shoulder: Secondary | ICD-10-CM | POA: Diagnosis not present

## 2016-12-14 DIAGNOSIS — D1720 Benign lipomatous neoplasm of skin and subcutaneous tissue of unspecified limb: Secondary | ICD-10-CM | POA: Diagnosis not present

## 2016-12-14 DIAGNOSIS — D171 Benign lipomatous neoplasm of skin and subcutaneous tissue of trunk: Secondary | ICD-10-CM | POA: Diagnosis not present

## 2016-12-27 ENCOUNTER — Other Ambulatory Visit: Payer: Self-pay

## 2016-12-27 MED ORDER — HYDROCHLOROTHIAZIDE 25 MG PO TABS: 25.0000 mg | ORAL_TABLET | Freq: Every day | ORAL | 11 refills | Status: DC

## 2017-02-04 DIAGNOSIS — Z029 Encounter for administrative examinations, unspecified: Secondary | ICD-10-CM

## 2017-02-05 ENCOUNTER — Telehealth: Payer: Self-pay

## 2017-02-05 MED ORDER — TRIAMCINOLONE ACETONIDE 0.1 % EX CREA: 1.0000 "application " | TOPICAL_CREAM | Freq: Three times a day (TID) | CUTANEOUS | 0 refills | Status: DC

## 2017-02-05 NOTE — Telephone Encounter (Signed)
Patient called asking for a refill on triamcinolone cream for bug bites. Please advise

## 2017-02-05 NOTE — Telephone Encounter (Signed)
Refill Triamcinolone Cream 0.1% 60 grams no refill to use on insect bites tid prn

## 2017-02-05 NOTE — Telephone Encounter (Signed)
Pt is aware.  

## 2017-02-21 ENCOUNTER — Ambulatory Visit (INDEPENDENT_AMBULATORY_CARE_PROVIDER_SITE_OTHER): Payer: 59 | Admitting: Internal Medicine

## 2017-02-21 ENCOUNTER — Encounter: Payer: Self-pay | Admitting: Internal Medicine

## 2017-02-21 VITALS — BP 116/70 | HR 73 | Temp 98.0°F | Wt 159.0 lb

## 2017-02-21 DIAGNOSIS — B9689 Other specified bacterial agents as the cause of diseases classified elsewhere: Secondary | ICD-10-CM

## 2017-02-21 DIAGNOSIS — R300 Dysuria: Secondary | ICD-10-CM

## 2017-02-21 DIAGNOSIS — N760 Acute vaginitis: Secondary | ICD-10-CM | POA: Diagnosis not present

## 2017-02-21 LAB — POCT URINALYSIS DIPSTICK
Bilirubin, UA: NEGATIVE
Glucose, UA: NEGATIVE
Ketones, UA: NEGATIVE
Leukocytes, UA: NEGATIVE
Nitrite, UA: NEGATIVE
Protein, UA: NEGATIVE
Spec Grav, UA: 1.015 (ref 1.010–1.025)
Urobilinogen, UA: 0.2 E.U./dL
pH, UA: 6.5 (ref 5.0–8.0)

## 2017-02-21 LAB — POCT WET PREP (WET MOUNT)

## 2017-02-21 MED ORDER — ALPRAZOLAM 0.25 MG PO TABS: ORAL_TABLET | ORAL | 5 refills | Status: DC

## 2017-02-21 MED ORDER — CLINDAMYCIN PHOSPHATE 2 % VA CREA: 1.0000 | TOPICAL_CREAM | Freq: Every day | VAGINAL | 0 refills | Status: DC

## 2017-02-21 NOTE — Progress Notes (Signed)
   Subjective:    Patient ID: Virginia [name], female    DOB: [birth year], 61 y.o.   MRN: [id number]  HPI Has had some issues recently for the past couple of days with dysuria. No fever, chills, back pain, nausea or vomiting. Think she may have a urinary tract infection. Has felt damp in the genital area. No urinary incontinence. No hematuria.    Review of Systems see above     Objective:   Physical Exam She has a thin vaginal discharge. Wet prep is consistent with clue cells. Urine dipstick is negative but culture was sent       Assessment & Plan:  Bacterial vaginosis  Dysuria  Plan: Clindamycin vaginal cream daily at bedtime 7 days.  Addendum 02/23/2017: Urine culture shows greater than 100,000 Col/mL Escherichia coli sensitive to Cipro. Patient was contacted and prescription for Cipro sent to her pharmacy. Advised to continue with clindamycin vaginal cream as well.

## 2017-02-23 ENCOUNTER — Encounter: Payer: Self-pay | Admitting: Internal Medicine

## 2017-02-23 ENCOUNTER — Telehealth: Payer: Self-pay | Admitting: Internal Medicine

## 2017-02-23 LAB — URINE CULTURE

## 2017-02-23 MED ORDER — CIPROFLOXACIN HCL 500 MG PO TABS: 500.0000 mg | ORAL_TABLET | Freq: Two times a day (BID) | ORAL | 0 refills | Status: DC

## 2017-02-23 NOTE — Telephone Encounter (Signed)
Has Escherichia coli UTI on culture. Still having some dysuria. Patient advised of this finding. Dipstick was negative when she was in for office visit.  Organism is sensitive to Cipro. Call in Cipro 500 mg twice daily for 7 days #14 to [facility]

## 2017-03-04 NOTE — Patient Instructions (Addendum)
Clindamycin vaginal cream daily at bedtime 7 days

## 2017-04-12 ENCOUNTER — Ambulatory Visit (INDEPENDENT_AMBULATORY_CARE_PROVIDER_SITE_OTHER): Payer: 59 | Admitting: Internal Medicine

## 2017-04-12 VITALS — BP 130/98 | HR 71 | Temp 97.8°F | Wt 160.0 lb

## 2017-04-12 DIAGNOSIS — W57XXXA Bitten or stung by nonvenomous insect and other nonvenomous arthropods, initial encounter: Secondary | ICD-10-CM

## 2017-04-12 DIAGNOSIS — R21 Rash and other nonspecific skin eruption: Secondary | ICD-10-CM

## 2017-04-12 DIAGNOSIS — L03312 Cellulitis of back [any part except buttock]: Secondary | ICD-10-CM | POA: Diagnosis not present

## 2017-04-12 LAB — CBC WITH DIFFERENTIAL/PLATELET
Basophils Absolute: 0 cells/uL (ref 0–200)
Basophils Relative: 0 %
Eosinophils Absolute: 207 cells/uL (ref 15–500)
Eosinophils Relative: 3 %
HCT: 36.7 % (ref 35.0–45.0)
Hemoglobin: 12.5 g/dL (ref 11.7–15.5)
Lymphocytes Relative: 49 %
Lymphs Abs: 3381 cells/uL (ref 850–3900)
MCH: 32 pg (ref 27.0–33.0)
MCHC: 34.1 g/dL (ref 32.0–36.0)
MCV: 93.9 fL (ref 80.0–100.0)
MPV: 8.8 fL (ref 7.5–12.5)
Monocytes Absolute: 690 cells/uL (ref 200–950)
Monocytes Relative: 10 %
Neutro Abs: 2622 cells/uL (ref 1500–7800)
Neutrophils Relative %: 38 %
Platelets: 320 10*3/uL (ref 140–400)
RBC: 3.91 MIL/uL (ref 3.80–5.10)
RDW: 13.3 % (ref 11.0–15.0)
WBC: 6.9 10*3/uL (ref 3.8–10.8)

## 2017-04-12 MED ORDER — DOXYCYCLINE HYCLATE 100 MG PO TABS: 100.0000 mg | ORAL_TABLET | Freq: Two times a day (BID) | ORAL | 0 refills | Status: DC

## 2017-04-12 MED ORDER — CEFTRIAXONE SODIUM 1 G IJ SOLR
1.0000 g | Freq: Once | INTRAMUSCULAR | Status: AC
  Administered 2017-04-12: 1 g via INTRAMUSCULAR

## 2017-04-12 NOTE — Progress Notes (Signed)
   Subjective:    Patient ID: Virginia [name], female    DOB: [birth year], 61 y.o.   MRN: [id number]  HPI  61 year old with insect bites August 15th. These were quite itchy. One was on her left lateral back near her shoulder blade and the other one was mid back. She's not sure if these were ticks or not but thinks that is a possibility.She is concerned about [facility] spotted fever and Lyme disease.  Unfortunately, she used a back brush to massage and scratch the area in the mid back because it was itchy.  This apparently has created a cellulitis approximately 8 x 6 cm with macular erythema and increased warmth    Review of Systems     Objective:   Physical Exam Macular area of erythema mid back 8 x 6 cm. Insect bite mid back and left shoulder blade area       Assessment & Plan:  Insect bites  Cellulitis mid back  Plan: Doxycycline 100 mg twice daily for 10 days. Call with progress report in 48-72 hours. Lyme and RMSF titers drawn because I think she is quite worried about this. I do not think she has these illnesses. CBC with differential drawn. 1 g IM Rocephin given.

## 2017-04-15 LAB — LYME ABY, WSTRN BLT IGG & IGM W/BANDS
B burgdorferi IgG Abs (IB): NEGATIVE
B burgdorferi IgM Abs (IB): NEGATIVE
Lyme Disease 18 kD IgG: NONREACTIVE
Lyme Disease 23 kD IgG: NONREACTIVE
Lyme Disease 23 kD IgM: REACTIVE — AB
Lyme Disease 28 kD IgG: NONREACTIVE
Lyme Disease 30 kD IgG: NONREACTIVE
Lyme Disease 39 kD IgG: NONREACTIVE
Lyme Disease 39 kD IgM: NONREACTIVE
Lyme Disease 41 kD IgG: NONREACTIVE
Lyme Disease 41 kD IgM: NONREACTIVE
Lyme Disease 45 kD IgG: NONREACTIVE
Lyme Disease 58 kD IgG: NONREACTIVE
Lyme Disease 66 kD IgG: NONREACTIVE
Lyme Disease 93 kD IgG: NONREACTIVE

## 2017-04-15 LAB — ROCKY MTN SPOTTED FVR ABS PNL(IGG+IGM)
RMSF IgG: NOT DETECTED
RMSF IgM: NOT DETECTED

## 2017-04-19 ENCOUNTER — Telehealth: Payer: Self-pay

## 2017-04-19 ENCOUNTER — Ambulatory Visit: Payer: 59 | Admitting: Internal Medicine

## 2017-04-19 NOTE — Telephone Encounter (Signed)
Patient called this morning and stated that a machine went down at her job and only two employees are in the office today. Because of that she can not leave and needs to cancel her appt. She said she is feeling much better and will call back Monday to discuss if she should reschedule because it is to much stress on her today to discuss.

## 2017-04-27 ENCOUNTER — Encounter: Payer: Self-pay | Admitting: Internal Medicine

## 2017-04-27 NOTE — Patient Instructions (Addendum)
Doxycycline 100 mg twice daily for 10 days. 1 g IM Rocephin given. Lyme and RMSF titers drawn. CBC with differential drawn.

## 2017-05-20 ENCOUNTER — Other Ambulatory Visit: Payer: Self-pay | Admitting: Internal Medicine

## 2017-05-20 DIAGNOSIS — Z1231 Encounter for screening mammogram for malignant neoplasm of breast: Secondary | ICD-10-CM

## 2017-05-21 ENCOUNTER — Other Ambulatory Visit: Payer: Commercial Managed Care - HMO | Admitting: Internal Medicine

## 2017-05-23 ENCOUNTER — Ambulatory Visit: Payer: Commercial Managed Care - HMO | Admitting: Internal Medicine

## 2017-05-28 ENCOUNTER — Ambulatory Visit: Payer: Commercial Managed Care - HMO

## 2017-06-05 ENCOUNTER — Other Ambulatory Visit: Payer: Self-pay | Admitting: General Surgery

## 2017-06-05 DIAGNOSIS — D1723 Benign lipomatous neoplasm of skin and subcutaneous tissue of right leg: Secondary | ICD-10-CM | POA: Diagnosis not present

## 2017-06-05 DIAGNOSIS — D171 Benign lipomatous neoplasm of skin and subcutaneous tissue of trunk: Secondary | ICD-10-CM | POA: Diagnosis not present

## 2017-06-25 ENCOUNTER — Telehealth: Payer: Self-pay | Admitting: Internal Medicine

## 2017-06-25 MED ORDER — IBUPROFEN 800 MG PO TABS: ORAL_TABLET | ORAL | 1 refills | Status: DC

## 2017-06-25 NOTE — Telephone Encounter (Signed)
#  90 one po tid prn with one refill

## 2017-06-25 NOTE — Telephone Encounter (Signed)
LM for pt that it was sent to pharmacy and refill sent to pharmacy

## 2017-06-25 NOTE — Telephone Encounter (Signed)
Calling to ask if you will refill her Rx for 800mg  Ibuprofen.  She hasn't had it filled in quite some time.  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    Pharmacy:  [facility] [place].  Best # for contact:  [phone]  Thank you.

## 2017-07-01 ENCOUNTER — Other Ambulatory Visit: Payer: Self-pay

## 2017-07-01 MED ORDER — PANTOPRAZOLE SODIUM 40 MG PO TBEC: 40.0000 mg | DELAYED_RELEASE_TABLET | Freq: Every day | ORAL | 11 refills | Status: DC

## 2017-07-09 ENCOUNTER — Ambulatory Visit (INDEPENDENT_AMBULATORY_CARE_PROVIDER_SITE_OTHER): Payer: 59 | Admitting: Internal Medicine

## 2017-07-09 DIAGNOSIS — Z23 Encounter for immunization: Secondary | ICD-10-CM | POA: Diagnosis not present

## 2017-07-09 NOTE — Progress Notes (Signed)
Flu shot given

## 2017-07-09 NOTE — Patient Instructions (Signed)
Flu shot given

## 2017-08-12 ENCOUNTER — Telehealth: Payer: Self-pay | Admitting: Internal Medicine

## 2017-08-12 NOTE — Telephone Encounter (Signed)
Patient called advised she just returned from [place] and has an URI and wants to be seen today.  Advised that Dr. Renold Genta is out of the office all week.  Apologized and suggested that she be seen at a local Urgent Care for treatment today.  Gave patient 3 different locations of availability.  Patient will be seen at one of these and see treatment.

## 2017-08-22 ENCOUNTER — Encounter: Payer: Self-pay | Admitting: Internal Medicine

## 2017-08-22 ENCOUNTER — Ambulatory Visit: Payer: 59 | Admitting: Internal Medicine

## 2017-08-22 VITALS — BP 140/80 | HR 72 | Ht 62.0 in | Wt 155.0 lb

## 2017-08-22 DIAGNOSIS — M7918 Myalgia, other site: Secondary | ICD-10-CM

## 2017-08-22 DIAGNOSIS — J22 Unspecified acute lower respiratory infection: Secondary | ICD-10-CM

## 2017-08-22 MED ORDER — CLARITHROMYCIN 500 MG PO TABS: 500.0000 mg | ORAL_TABLET | Freq: Two times a day (BID) | ORAL | 0 refills | Status: DC

## 2017-08-22 MED ORDER — HYDROCODONE-HOMATROPINE 5-1.5 MG/5ML PO SYRP: 5.0000 mL | ORAL_SOLUTION | Freq: Three times a day (TID) | ORAL | 0 refills | Status: DC | PRN

## 2017-08-22 MED ORDER — METHYLPREDNISOLONE ACETATE 80 MG/ML IJ SUSP
80.0000 mg | Freq: Once | INTRAMUSCULAR | Status: AC
  Administered 2017-08-22: 80 mg via INTRAMUSCULAR

## 2017-08-22 MED ORDER — CYCLOBENZAPRINE HCL 10 MG PO TABS: ORAL_TABLET | ORAL | 0 refills | Status: DC

## 2017-08-22 NOTE — Progress Notes (Signed)
   Subjective:    Patient ID: Virginia [name], female    DOB: 03/13/1956, 61 y.o.   MRN: [id number]  HPI  61 year old Female went to [place] for the Thanksgiving holidays.  Was gone for about 12 days and came down with a respiratory infection.  When she returned, I was out of town.  She went to an urgent care and was placed on Biaxin and was given Hycodan.  She has only a small amount of Hycodan left.  She is still coughing but not 100% well.  Slight discolored sputum production.  Has malaise and fatigue.  Wants order for Shingrix vaccine.  Review of Systems also complaining of pulled muscle mid back from coughing.  Wants muscle relaxant.     Objective:   Physical Exam Skin warm and dry.  Nodes none.  Left TM full.  Right TM clear.  Pharynx injected without exudate.  Neck is supple.  Chest clear to auscultation without rales or wheezing.       Assessment & Plan:  Bronchitis  Musculoskeletal pain secondary to coughing  Plan: Order given for Shingrix vaccine at her request. Biaxin 500 mg twice daily for 10 days.  Hycodan 1 teaspoon p.o. every 6-8 hours as needed cough.  Depo-Medrol 80 mg IM.  Flexeril 10 mg #30 1/2-1 p.o. nightly as needed musculoskeletal pain.

## 2017-08-22 NOTE — Patient Instructions (Signed)
Flexeril 1/2-1 tablet at bedtime for musculoskeletal pain.  Hycodan 1 teaspoon p.o. every 6-8 hours as needed cough.  Biaxin 500 mg twice daily for 10 days.  Depo-Medrol 80 mg IM.  Order given for Shingrix vaccine.

## 2017-08-22 NOTE — Addendum Note (Signed)
Addended by: Elby Showers on: 08/22/2017 05:29 PM   Modules accepted: Orders

## 2017-08-22 NOTE — Addendum Note (Signed)
Addended by: Mady Haagensen on: 08/22/2017 05:19 PM   Modules accepted: Orders

## 2017-09-12 ENCOUNTER — Other Ambulatory Visit: Payer: Self-pay

## 2017-09-12 MED ORDER — LOSARTAN POTASSIUM 100 MG PO TABS: 100.0000 mg | ORAL_TABLET | Freq: Every day | ORAL | 0 refills | Status: DC

## 2017-09-12 NOTE — Telephone Encounter (Signed)
Scheduled CPE and labs in March and refilled losartan.

## 2017-09-24 ENCOUNTER — Ambulatory Visit
Admission: RE | Admit: 2017-09-24 | Discharge: 2017-09-24 | Disposition: A | Discharge status: Home / Self Care | Payer: Commercial Managed Care - HMO | Source: Ambulatory Visit | Attending: Internal Medicine | Admitting: Internal Medicine

## 2017-09-24 DIAGNOSIS — Z1231 Encounter for screening mammogram for malignant neoplasm of breast: Secondary | ICD-10-CM

## 2017-10-18 ENCOUNTER — Other Ambulatory Visit: Payer: Self-pay

## 2017-10-18 DIAGNOSIS — Z1329 Encounter for screening for other suspected endocrine disorder: Secondary | ICD-10-CM

## 2017-10-18 DIAGNOSIS — I10 Essential (primary) hypertension: Secondary | ICD-10-CM

## 2017-10-18 DIAGNOSIS — Z0000 Encounter for general adult medical examination without abnormal findings: Secondary | ICD-10-CM

## 2017-10-18 DIAGNOSIS — E785 Hyperlipidemia, unspecified: Secondary | ICD-10-CM

## 2017-10-23 ENCOUNTER — Other Ambulatory Visit: Payer: Self-pay

## 2017-11-04 DIAGNOSIS — L821 Other seborrheic keratosis: Secondary | ICD-10-CM | POA: Diagnosis not present

## 2017-11-04 DIAGNOSIS — D229 Melanocytic nevi, unspecified: Secondary | ICD-10-CM | POA: Diagnosis not present

## 2017-11-06 ENCOUNTER — Other Ambulatory Visit: Payer: Self-pay

## 2017-11-06 DIAGNOSIS — M7542 Impingement syndrome of left shoulder: Secondary | ICD-10-CM | POA: Diagnosis not present

## 2017-11-06 DIAGNOSIS — M5127 Other intervertebral disc displacement, lumbosacral region: Secondary | ICD-10-CM | POA: Diagnosis not present

## 2017-11-06 DIAGNOSIS — M50223 Other cervical disc displacement at C6-C7 level: Secondary | ICD-10-CM | POA: Diagnosis not present

## 2017-11-06 MED ORDER — LOSARTAN POTASSIUM 100 MG PO TABS: 100.0000 mg | ORAL_TABLET | Freq: Every day | ORAL | 0 refills | Status: DC

## 2017-11-14 DIAGNOSIS — M5127 Other intervertebral disc displacement, lumbosacral region: Secondary | ICD-10-CM | POA: Diagnosis not present

## 2017-11-14 DIAGNOSIS — M50223 Other cervical disc displacement at C6-C7 level: Secondary | ICD-10-CM | POA: Diagnosis not present

## 2017-11-14 DIAGNOSIS — M7542 Impingement syndrome of left shoulder: Secondary | ICD-10-CM | POA: Diagnosis not present

## 2017-11-15 ENCOUNTER — Other Ambulatory Visit: Payer: 59 | Admitting: Internal Medicine

## 2017-11-15 DIAGNOSIS — Z1329 Encounter for screening for other suspected endocrine disorder: Secondary | ICD-10-CM | POA: Diagnosis not present

## 2017-11-15 DIAGNOSIS — Z0000 Encounter for general adult medical examination without abnormal findings: Secondary | ICD-10-CM

## 2017-11-15 DIAGNOSIS — I10 Essential (primary) hypertension: Secondary | ICD-10-CM

## 2017-11-15 DIAGNOSIS — E785 Hyperlipidemia, unspecified: Secondary | ICD-10-CM

## 2017-11-15 DIAGNOSIS — R7302 Impaired glucose tolerance (oral): Secondary | ICD-10-CM

## 2017-11-16 LAB — HEMOGLOBIN A1C
Hgb A1c MFr Bld: 5.9 % of total Hgb — ABNORMAL HIGH (ref <5.7)
Mean Plasma Glucose: 123 (calc)
eAG (mmol/L): 6.8 (calc)

## 2017-11-16 LAB — COMPLETE METABOLIC PANEL WITH GFR
AG Ratio: 1.8 (calc) (ref 1.0–2.5)
ALT: 21 U/L (ref 6–29)
AST: 14 U/L (ref 10–35)
Albumin: 4.1 g/dL (ref 3.6–5.1)
Alkaline phosphatase (APISO): 62 U/L (ref 33–130)
BUN: 14 mg/dL (ref 7–25)
CO2: 29 mmol/L (ref 20–32)
Calcium: 9.3 mg/dL (ref 8.6–10.4)
Chloride: 103 mmol/L (ref 98–110)
Creat: 0.58 mg/dL (ref 0.50–0.99)
GFR, Est African American: 116 mL/min/{1.73_m2} (ref >=60)
GFR, Est Non African American: 100 mL/min/{1.73_m2} (ref >=60)
Globulin: 2.3 g/dL (calc) (ref 1.9–3.7)
Glucose, Bld: 111 mg/dL — ABNORMAL HIGH (ref 65–99)
Potassium: 4.6 mmol/L (ref 3.5–5.3)
Sodium: 139 mmol/L (ref 135–146)
Total Bilirubin: 0.4 mg/dL (ref 0.2–1.2)
Total Protein: 6.4 g/dL (ref 6.1–8.1)

## 2017-11-16 LAB — CBC WITH DIFFERENTIAL/PLATELET
Basophils Absolute: 50 cells/uL (ref 0–200)
Basophils Relative: 0.7 %
Eosinophils Absolute: 209 cells/uL (ref 15–500)
Eosinophils Relative: 2.9 %
HCT: 35.9 % (ref 35.0–45.0)
Hemoglobin: 12.5 g/dL (ref 11.7–15.5)
Lymphs Abs: 2822 cells/uL (ref 850–3900)
MCH: 32.1 pg (ref 27.0–33.0)
MCHC: 34.8 g/dL (ref 32.0–36.0)
MCV: 92.1 fL (ref 80.0–100.0)
MPV: 9.3 fL (ref 7.5–12.5)
Monocytes Relative: 7.6 %
Neutro Abs: 3571 cells/uL (ref 1500–7800)
Neutrophils Relative %: 49.6 %
Platelets: 377 10*3/uL (ref 140–400)
RBC: 3.9 10*6/uL (ref 3.80–5.10)
RDW: 12.6 % (ref 11.0–15.0)
Total Lymphocyte: 39.2 %
WBC mixed population: 547 cells/uL (ref 200–950)
WBC: 7.2 10*3/uL (ref 3.8–10.8)

## 2017-11-16 LAB — LIPID PANEL
Cholesterol: 223 mg/dL — ABNORMAL HIGH (ref <200)
HDL: 87 mg/dL (ref >50)
LDL Cholesterol (Calc): 109 mg/dL (calc) — ABNORMAL HIGH
Non-HDL Cholesterol (Calc): 136 mg/dL (calc) — ABNORMAL HIGH (ref <130)
Total CHOL/HDL Ratio: 2.6 (calc) (ref <5.0)
Triglycerides: 154 mg/dL — ABNORMAL HIGH (ref <150)

## 2017-11-16 LAB — VITAMIN D 25 HYDROXY (VIT D DEFICIENCY, FRACTURES): Vit D, 25-Hydroxy: 28 ng/mL — ABNORMAL LOW (ref 30–100)

## 2017-11-16 LAB — TSH: TSH: 1.51 mIU/L (ref 0.40–4.50)

## 2017-11-18 ENCOUNTER — Ambulatory Visit (INDEPENDENT_AMBULATORY_CARE_PROVIDER_SITE_OTHER): Payer: 59 | Admitting: Internal Medicine

## 2017-11-18 ENCOUNTER — Encounter: Payer: Self-pay | Admitting: Internal Medicine

## 2017-11-18 VITALS — BP 120/80 | HR 78 | Temp 97.9°F | Ht 62.25 in | Wt 155.0 lb

## 2017-11-18 DIAGNOSIS — R7302 Impaired glucose tolerance (oral): Secondary | ICD-10-CM

## 2017-11-18 DIAGNOSIS — E785 Hyperlipidemia, unspecified: Secondary | ICD-10-CM | POA: Diagnosis not present

## 2017-11-18 DIAGNOSIS — Z87898 Personal history of other specified conditions: Secondary | ICD-10-CM

## 2017-11-18 DIAGNOSIS — E559 Vitamin D deficiency, unspecified: Secondary | ICD-10-CM

## 2017-11-18 DIAGNOSIS — I10 Essential (primary) hypertension: Secondary | ICD-10-CM | POA: Diagnosis not present

## 2017-11-18 DIAGNOSIS — K219 Gastro-esophageal reflux disease without esophagitis: Secondary | ICD-10-CM

## 2017-11-18 DIAGNOSIS — R8290 Unspecified abnormal findings in urine: Secondary | ICD-10-CM

## 2017-11-18 DIAGNOSIS — Z87891 Personal history of nicotine dependence: Secondary | ICD-10-CM | POA: Diagnosis not present

## 2017-11-18 DIAGNOSIS — Z0000 Encounter for general adult medical examination without abnormal findings: Secondary | ICD-10-CM

## 2017-11-18 LAB — POCT URINALYSIS DIPSTICK
Appearance: ABNORMAL
Bilirubin, UA: NEGATIVE
Blood, UA: NEGATIVE
Glucose, UA: NEGATIVE
Ketones, UA: NEGATIVE
Nitrite, UA: NEGATIVE
Odor: ABNORMAL
Protein, UA: NEGATIVE
Spec Grav, UA: 1.01 (ref 1.010–1.025)
Urobilinogen, UA: 0.2 E.U./dL
pH, UA: 6.5 (ref 5.0–8.0)

## 2017-11-18 NOTE — Progress Notes (Signed)
   Subjective:    Patient ID: Virginia [name], female    DOB: 1956/06/15, 62 y.o.   MRN: [id number]  HPI 62 year old Female for health maintenance exam and evaluation of medical issues.  History of hyperlipidemia.  History of microscopic hematuria evaluated by Dr. Karsten Ro in 2009 and was found to be benign  History of smoking for over 20 years.  She quit but then resumed at one point.  History of anxiety.  No known drug allergies.  Past medical history: Bilateral tubal ligation 1995, D&C January 2000, vaginal hysterectomy July 2000, laparoscopic cholecystectomy January 2002.  History of asthmatic bronchitis.  History of palpitations treated with Lopressor.  She had an evaluation for chest pain in 2013 showing an incomplete right bundle branch block with T wave inversions in leads V2 and V3.  History of GE reflux.  History of fractured left wrist at age 58.  Social history: She is married.  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  Social alcohol consumption.  3 adult children, 2 daughters and a son.  Family history: Father died at age 64 of an MI.  One sister died of complications of HIV.  Another sister died of cancer.  One sister living.  One brother living.  Colonoscopy discussed.  Had mammogram in  January of this year.  DEXA scan done 2015.  Had mild osteopenia at that time.  Had chest x-ray in 2018 because of cough which was normal.  Had lipoma removed from right thigh September 2018.  She has  hyperlipidemia with total cholesterol 223 and triglycerides of 154.  HDL cholesterol is good at 8 and LDL cholesterol is 109.7 she would benefit from diet exercise and weight loss.  Her A1c is elevated at 5.9% and last year was 6% consistent with impaired glucose tolerance.  She has vitamin D deficiency with level being 28 needs to take 2000 units vitamin D3 daily.   Review of Systems see above     Objective:   Physical Exam Skin warm and dry.  Nodes none.  TMs and pharynx  are clear.  Neck is supple without JVD thyromegaly or carotid bruits.  Chest clear to auscultation.  Cardiac exam regular rate and rhythm normal S1 and S2 without murmurs gallops or rubs.  Abdomen no hepatosplenomegaly masses or tenderness.       Assessment & Plan:  Impaired glucose tolerance  Hyperlipidemia  Vitamin D deficiency  Plan: Take 2000 units vitamin D3 daily.  Please get all diet exercise and weight loss program.  We can refer you to dietitian.  Watch carbohydrates.  Watch fat diet.  Try to get daily exercise.  Return in 6 months for follow-up.  Consider colonoscopy.

## 2017-11-20 LAB — URINE CULTURE
MICRO NUMBER:: 90307114
SPECIMEN QUALITY:: ADEQUATE

## 2017-11-21 ENCOUNTER — Telehealth: Payer: Self-pay | Admitting: Internal Medicine

## 2017-11-21 MED ORDER — LOSARTAN POTASSIUM 100 MG PO TABS: 100.0000 mg | ORAL_TABLET | Freq: Every day | ORAL | 0 refills | Status: DC

## 2017-11-21 NOTE — Telephone Encounter (Signed)
Pt was notified of results and instructions, pt verbalized understanding.  [facility] [place]. RX was e-scribed there.

## 2017-11-21 NOTE — Addendum Note (Signed)
Addended by: Mady Haagensen on: 11/21/2017 04:44 PM   Modules accepted: Orders

## 2017-11-21 NOTE — Telephone Encounter (Signed)
She will need to go to another pharmacy. I have not been changing this. She needs to call another pharmacy of her choice to see if they have some that has not been recalled like [facility]

## 2017-11-21 NOTE — Telephone Encounter (Signed)
Patient called requesting a refill on ALPRAZolam (XANAX) 0.25 MG tablet - 1 po bid prn anxiety to [facility] on [address].

## 2017-11-21 NOTE — Telephone Encounter (Signed)
Patient called stating that she was informed by her pharmacy about a recall on her losartan (COZAAR) 100 MG tablet prescription. She would like for something else to be sent in to replace this.  Patient uses [facility] on [address].

## 2017-11-21 NOTE — Telephone Encounter (Signed)
Please refill x 6 months

## 2017-12-04 ENCOUNTER — Other Ambulatory Visit: Payer: Self-pay

## 2017-12-04 NOTE — Telephone Encounter (Signed)
Refill x 6 months 

## 2017-12-04 NOTE — Telephone Encounter (Signed)
Patient called to request a refill on her xanax 0.25mg 

## 2017-12-05 MED ORDER — ALPRAZOLAM 0.25 MG PO TABS: ORAL_TABLET | ORAL | 5 refills | Status: DC

## 2017-12-08 DIAGNOSIS — R7302 Impaired glucose tolerance (oral): Secondary | ICD-10-CM | POA: Insufficient documentation

## 2017-12-08 DIAGNOSIS — E559 Vitamin D deficiency, unspecified: Secondary | ICD-10-CM | POA: Insufficient documentation

## 2017-12-08 NOTE — Patient Instructions (Addendum)
You have evidence of mild hyperlipidemia and impaired glucose tolerance.  Recommend diet exercise and weight loss.  Take 2000 units vitamin D3 daily.  Return in 6 months for follow-up of hyperlipidemia and impaired glucose tolerance.

## 2018-01-03 ENCOUNTER — Telehealth: Payer: Self-pay | Admitting: Internal Medicine

## 2018-01-03 NOTE — Telephone Encounter (Signed)
Virginia [name] Self [phone]  Misaki called with concerns on continuing to take the Losartan with all of the recalls out there. She talked with the pharmacy, but would still like your advice. She is really pleased with the results, while she has been on this medicine, but is wandering if she should try another kind.

## 2018-01-03 NOTE — Telephone Encounter (Signed)
Called patient to let her know that Dr Lauree Chandler said if she checked with pharmacy and the lot of her medication is not on recall that she has no concerns at this ttime

## 2018-01-03 NOTE — Telephone Encounter (Signed)
If the pharmacy says her lot is safe and not on recall, there is no concern at this time.

## 2018-01-07 ENCOUNTER — Telehealth: Payer: Self-pay | Admitting: Internal Medicine

## 2018-01-07 MED ORDER — HYDROCHLOROTHIAZIDE 25 MG PO TABS: 25.0000 mg | ORAL_TABLET | Freq: Every day | ORAL | 11 refills | Status: DC

## 2018-01-07 NOTE — Telephone Encounter (Signed)
Escribed

## 2018-01-07 NOTE — Telephone Encounter (Signed)
Refill x one year °

## 2018-01-07 NOTE — Telephone Encounter (Signed)
Virginia [name] [phone]  [facility] [address]  hydrochlorothiazide (HYDRODIURIL) 25 MG tablet  [name] called to say she is completely out of her medicine, she stated she had called it in to the pharmacy several days ago and they said they are waiting to hear back from [name]. They will give her a couple to get her by.

## 2018-01-13 DIAGNOSIS — M19042 Primary osteoarthritis, left hand: Secondary | ICD-10-CM | POA: Diagnosis not present

## 2018-01-13 DIAGNOSIS — M19041 Primary osteoarthritis, right hand: Secondary | ICD-10-CM | POA: Diagnosis not present

## 2018-02-03 ENCOUNTER — Other Ambulatory Visit: Payer: Self-pay | Admitting: Internal Medicine

## 2018-02-12 ENCOUNTER — Encounter: Payer: Self-pay | Admitting: Internal Medicine

## 2018-03-06 ENCOUNTER — Other Ambulatory Visit: Payer: Self-pay | Admitting: Internal Medicine

## 2018-03-25 DIAGNOSIS — M50223 Other cervical disc displacement at C6-C7 level: Secondary | ICD-10-CM | POA: Diagnosis not present

## 2018-03-25 DIAGNOSIS — M7542 Impingement syndrome of left shoulder: Secondary | ICD-10-CM | POA: Diagnosis not present

## 2018-03-25 DIAGNOSIS — M5127 Other intervertebral disc displacement, lumbosacral region: Secondary | ICD-10-CM | POA: Diagnosis not present

## 2018-04-16 ENCOUNTER — Ambulatory Visit (AMBULATORY_SURGERY_CENTER): Payer: Self-pay

## 2018-04-16 VITALS — Ht 63.0 in | Wt 155.8 lb

## 2018-04-16 DIAGNOSIS — Z1211 Encounter for screening for malignant neoplasm of colon: Secondary | ICD-10-CM

## 2018-04-16 MED ORDER — NA SULFATE-K SULFATE-MG SULF 17.5-3.13-1.6 GM/177ML PO SOLN: 1.0000 | Freq: Once | ORAL | 0 refills | Status: AC

## 2018-04-16 NOTE — Progress Notes (Signed)
Per pt, no allergies to soy or egg products.Pt not taking any weight loss meds or using  O2 at home.  Pt refused emmi video. 

## 2018-04-21 ENCOUNTER — Encounter: Payer: Self-pay | Admitting: Internal Medicine

## 2018-04-22 ENCOUNTER — Encounter: Payer: Self-pay | Admitting: Internal Medicine

## 2018-04-22 ENCOUNTER — Ambulatory Visit (INDEPENDENT_AMBULATORY_CARE_PROVIDER_SITE_OTHER): Payer: 59 | Admitting: Internal Medicine

## 2018-04-22 VITALS — BP 112/82 | HR 74 | Temp 98.1°F | Ht 62.25 in | Wt 156.0 lb

## 2018-04-22 DIAGNOSIS — H6693 Otitis media, unspecified, bilateral: Secondary | ICD-10-CM

## 2018-04-22 DIAGNOSIS — H9203 Otalgia, bilateral: Secondary | ICD-10-CM | POA: Diagnosis not present

## 2018-04-22 MED ORDER — METHYLPREDNISOLONE ACETATE 80 MG/ML IJ SUSP
80.0000 mg | Freq: Once | INTRAMUSCULAR | Status: AC
Start: 2018-04-22 — End: 2018-04-22
  Administered 2018-04-22: 80 mg via INTRAMUSCULAR

## 2018-04-22 MED ORDER — CLARITHROMYCIN 500 MG PO TABS: 500.0000 mg | ORAL_TABLET | Freq: Two times a day (BID) | ORAL | 1 refills | Status: DC

## 2018-04-22 NOTE — Progress Notes (Signed)
   Subjective:    Patient ID: Virginia [name], female    DOB: 01-04-1956, 62 y.o.   MRN: [id number]  HPI 62 year old Female has noticed ears feeling stuffy with postnasal drip.  No cough.  No discolored sputum.  She was in a Jacuzzi recently and went underwater and had ear pain bilaterally.  She is to have a colonoscopy August 21 and is concerned about the symptoms.    Review of Systems see above     Objective:   Physical Exam Pharynx very slightly injected.  Both TMs are full bilaterally but not red.  She has anterior cervical nodes bilaterally.  Neck is supple.  Chest clear to auscultation.       Assessment & Plan:  Acute bilateral  otitis media  Plan: Biaxin 500 mg twice daily for 10 days with 1 refill.  She will take a refill with her to [place] in October.  Depo-Medrol 80 mg IM which should help with her congestion.

## 2018-04-22 NOTE — Patient Instructions (Addendum)
Depo-Medrol 80 mg IM.  Biaxin 500 mg twice daily for 10 days.

## 2018-04-30 ENCOUNTER — Encounter: Payer: Commercial Managed Care - HMO | Admitting: Internal Medicine

## 2018-05-19 ENCOUNTER — Other Ambulatory Visit: Payer: Self-pay | Admitting: Internal Medicine

## 2018-05-19 DIAGNOSIS — E785 Hyperlipidemia, unspecified: Secondary | ICD-10-CM

## 2018-05-19 DIAGNOSIS — I10 Essential (primary) hypertension: Secondary | ICD-10-CM

## 2018-05-19 DIAGNOSIS — R7302 Impaired glucose tolerance (oral): Secondary | ICD-10-CM

## 2018-05-20 ENCOUNTER — Other Ambulatory Visit: Payer: 59 | Admitting: Internal Medicine

## 2018-05-20 DIAGNOSIS — R7302 Impaired glucose tolerance (oral): Secondary | ICD-10-CM | POA: Diagnosis not present

## 2018-05-20 DIAGNOSIS — E785 Hyperlipidemia, unspecified: Secondary | ICD-10-CM

## 2018-05-20 DIAGNOSIS — I10 Essential (primary) hypertension: Secondary | ICD-10-CM

## 2018-05-21 LAB — HEMOGLOBIN A1C
Hgb A1c MFr Bld: 6 % of total Hgb — ABNORMAL HIGH (ref <5.7)
Mean Plasma Glucose: 126 (calc)
eAG (mmol/L): 7 (calc)

## 2018-05-21 LAB — LIPID PANEL
Cholesterol: 246 mg/dL — ABNORMAL HIGH (ref <200)
HDL: 88 mg/dL (ref >50)
LDL Cholesterol (Calc): 139 mg/dL (calc) — ABNORMAL HIGH
Non-HDL Cholesterol (Calc): 158 mg/dL (calc) — ABNORMAL HIGH (ref <130)
Total CHOL/HDL Ratio: 2.8 (calc) (ref <5.0)
Triglycerides: 88 mg/dL (ref <150)

## 2018-05-21 LAB — MICROALBUMIN / CREATININE URINE RATIO
Creatinine, Urine: 79 mg/dL (ref 20–275)
Microalb Creat Ratio: 11 mcg/mg creat (ref <30)
Microalb, Ur: 0.9 mg/dL

## 2018-05-22 ENCOUNTER — Ambulatory Visit (INDEPENDENT_AMBULATORY_CARE_PROVIDER_SITE_OTHER): Payer: 59 | Admitting: Internal Medicine

## 2018-05-22 ENCOUNTER — Encounter: Payer: Self-pay | Admitting: Internal Medicine

## 2018-05-22 VITALS — BP 110/80 | HR 74 | Temp 98.3°F | Ht 62.25 in | Wt 154.0 lb

## 2018-05-22 DIAGNOSIS — E782 Mixed hyperlipidemia: Secondary | ICD-10-CM

## 2018-05-22 DIAGNOSIS — R7302 Impaired glucose tolerance (oral): Secondary | ICD-10-CM

## 2018-05-22 DIAGNOSIS — I10 Essential (primary) hypertension: Secondary | ICD-10-CM | POA: Diagnosis not present

## 2018-05-22 DIAGNOSIS — Z23 Encounter for immunization: Secondary | ICD-10-CM

## 2018-05-22 MED ORDER — ROSUVASTATIN CALCIUM 5 MG PO TABS: ORAL_TABLET | ORAL | 3 refills | Status: DC

## 2018-05-22 MED ORDER — IBUPROFEN 800 MG PO TABS: ORAL_TABLET | ORAL | 1 refills | Status: DC

## 2018-05-22 NOTE — Progress Notes (Signed)
   Subjective:    Patient ID: Virginia [name], female    DOB: 08-01-1956, 62 y.o.   MRN: [id number]  HPI 62 year old Female for 6 month follow up.  History of impaired glucose tolerance and mixed hyperlipidemia.  Triglycerides have improved from 1 54-88.  However total cholesterol has gone up from 2 23-2 46.  LDL cholesterol has increased from 109 139.  We are going to try Crestor 5 mg 3 times a week.  She does eat a lot of cheese.  Hemoglobin A1c is 6% and previously was 5.9%.  We discussed how to control impaired glucose tolerance.  She does drink wine.  Does not eat bread.  Is been eating more fruit.    Review of Systems see above     Objective:   Physical Exam  Neck supple without thyromegaly JVD or carotid bruits.  Chest clear to auscultation.  Cardiac exam regular rate and rhythm.      Assessment & Plan:  Impaired glucose tolerance-we will continue to watch diet.  She has been working out for 1 hour several times weekly.  She will try dietary modification.  Mixed hyperlipidemia-begin Crestor 5 mg 3 times a week and follow-up in late January with lipid panel liver functions and hemoglobin A1c with office visit.  Flu vaccine given.

## 2018-05-22 NOTE — Patient Instructions (Addendum)
Start Crestor 5 mg at supper 3 times a week.  Continue to watch diet and exercise.  Follow-up late January for office visit lipid panel liver functions and hemoglobin A1c.  Flu vaccine given today.

## 2018-05-29 ENCOUNTER — Other Ambulatory Visit: Payer: Self-pay

## 2018-05-29 ENCOUNTER — Other Ambulatory Visit: Payer: Self-pay | Admitting: Internal Medicine

## 2018-05-29 MED ORDER — PANTOPRAZOLE SODIUM 40 MG PO TBEC: 40.0000 mg | DELAYED_RELEASE_TABLET | Freq: Every day | ORAL | 11 refills | Status: DC

## 2018-06-10 ENCOUNTER — Telehealth: Payer: Self-pay | Admitting: Internal Medicine

## 2018-06-10 ENCOUNTER — Encounter: Payer: Self-pay | Admitting: Internal Medicine

## 2018-06-10 MED ORDER — HYDROCODONE-HOMATROPINE 5-1.5 MG/5ML PO SYRP: 5.0000 mL | ORAL_SOLUTION | Freq: Three times a day (TID) | ORAL | 0 refills | Status: DC | PRN

## 2018-06-10 MED ORDER — CLARITHROMYCIN 500 MG PO TABS: 500.0000 mg | ORAL_TABLET | Freq: Two times a day (BID) | ORAL | 0 refills | Status: DC

## 2018-06-10 NOTE — Telephone Encounter (Signed)
Pt traveling to [place] next week and then to [place]. Requesting antibiotic for travel. Call in Hycodan and Biaxin

## 2018-06-29 ENCOUNTER — Other Ambulatory Visit: Payer: Self-pay | Admitting: Internal Medicine

## 2018-07-18 ENCOUNTER — Ambulatory Visit: Payer: 59 | Admitting: Internal Medicine

## 2018-07-18 ENCOUNTER — Encounter: Payer: Self-pay | Admitting: Internal Medicine

## 2018-07-18 VITALS — BP 120/80 | HR 92 | Temp 98.2°F | Ht 62.5 in | Wt 152.0 lb

## 2018-07-18 DIAGNOSIS — J069 Acute upper respiratory infection, unspecified: Secondary | ICD-10-CM

## 2018-07-18 DIAGNOSIS — J0100 Acute maxillary sinusitis, unspecified: Secondary | ICD-10-CM

## 2018-07-18 MED ORDER — CLARITHROMYCIN 500 MG PO TABS: 500.0000 mg | ORAL_TABLET | Freq: Two times a day (BID) | ORAL | 0 refills | Status: DC

## 2018-07-18 MED ORDER — CEFTRIAXONE SODIUM 1 G IJ SOLR
1.0000 g | Freq: Once | INTRAMUSCULAR | Status: AC
  Administered 2018-07-18: 1 g via INTRAMUSCULAR

## 2018-07-18 NOTE — Progress Notes (Signed)
   Subjective:    Patient ID: Virginia [name], female    DOB: 27-Feb-1956, 62 y.o.   MRN: [id number]  HPI 62 year old Female has come down with a respiratory tract infection.  Has had cough and congestion.  Sounds nasally congested.  Some discolored sputum.  May be some low-grade fever.  Leaving to go to [place] around November 18.  Recently traveled to [place].    Review of Systems  HENT: Positive for ear pain, sinus pressure and sore throat.   Respiratory: Positive for cough. Negative for shortness of breath and wheezing.        Objective:   Physical Exam  Constitutional: She appears well-developed and well-nourished. No distress.  HENT:  Right Ear: External ear normal.  Left Ear: External ear normal.  Pharynx slightly injected without exudate  Eyes:  TMs clear bilaterally.  Neck: Neck supple. No thyromegaly present.  Pulmonary/Chest: No stridor. No respiratory distress. She has no wheezes. She has no rales.  Lymphadenopathy:    She has no cervical adenopathy.  Skin: Skin is warm and dry. She is not diaphoretic.  Vitals reviewed.         Assessment & Plan:  Acute maxillary sinusitis  Otalgia  Sore throat  Plan: Rocephin 1 g IM.  Biaxin 500 mg twice daily for 10 days.  Has Hycodan on hand for cough.  Rest and drink plenty of fluids

## 2018-07-18 NOTE — Patient Instructions (Addendum)
Biaxin 500 mg twice daily x 10 days. Keep taking Hycodan sparingly. Rocephin one gram IM.  Rest and drink plenty of fluids.

## 2018-09-01 ENCOUNTER — Telehealth: Payer: Self-pay | Admitting: Internal Medicine

## 2018-09-01 ENCOUNTER — Ambulatory Visit: Payer: 59 | Admitting: Internal Medicine

## 2018-09-01 ENCOUNTER — Encounter: Payer: Self-pay | Admitting: Internal Medicine

## 2018-09-01 VITALS — BP 110/80 | HR 78 | Temp 98.2°F | Ht 62.5 in | Wt 147.0 lb

## 2018-09-01 DIAGNOSIS — K2900 Acute gastritis without bleeding: Secondary | ICD-10-CM

## 2018-09-01 NOTE — Telephone Encounter (Signed)
Patient called c/o pain under her breasts in the rib care area.  States that when she eats she feels full immediately.  She has tried Tums with no relief.  She is concerned and didn't want to go into the holidays with this going on.  Started on Friday.  No fever.    Spoke with Dr. Renold Genta and she advised to bring patient in for appointment today.    Spoke with patient and provided appointment for this afternoon at 4pm.

## 2018-09-01 NOTE — Progress Notes (Signed)
   Subjective:    Patient ID: Virginia [name], female    DOB: [birth year], 62 y.o.   MRN: [id number]  HPI 62 year old Female in today with complaint of some mild abdominal pain with sensation of gas up under her right rib cage area.  Does not recall any foods that would have aggravated her GI tract.  No documented fever.  No vomiting.  Some nausea.  Also has had some vague back pain.    Review of Systems no fever or shaking chills     Objective:   Physical Exam Abdomen is soft nondistended without hepatosplenomegaly or masses.  She is tender in the epigastric area without rebound.  Not particularly tender in the right upper quadrant.  Vital signs reviewed       Assessment & Plan:  ?  Gastroenteritis or gastritis  Plan: Patient given 30 cc p.o. GI cocktail.  Patient is to stay with clear liquids and advance diet slowly.  She has PPI to take for reflux symptoms.  Call if not getting better in a couple of days or sooner if worse.

## 2018-09-20 NOTE — Patient Instructions (Signed)
GI cocktail given.  Continue PPI and eat lightly with advancement of diet slowly.  Call if not getting better.

## 2018-09-24 DIAGNOSIS — M7542 Impingement syndrome of left shoulder: Secondary | ICD-10-CM | POA: Diagnosis not present

## 2018-09-24 DIAGNOSIS — M50223 Other cervical disc displacement at C6-C7 level: Secondary | ICD-10-CM | POA: Diagnosis not present

## 2018-09-24 DIAGNOSIS — M5127 Other intervertebral disc displacement, lumbosacral region: Secondary | ICD-10-CM | POA: Diagnosis not present

## 2018-10-01 DIAGNOSIS — M5127 Other intervertebral disc displacement, lumbosacral region: Secondary | ICD-10-CM | POA: Diagnosis not present

## 2018-10-01 DIAGNOSIS — M7542 Impingement syndrome of left shoulder: Secondary | ICD-10-CM | POA: Diagnosis not present

## 2018-10-01 DIAGNOSIS — M50223 Other cervical disc displacement at C6-C7 level: Secondary | ICD-10-CM | POA: Diagnosis not present

## 2018-10-03 ENCOUNTER — Other Ambulatory Visit: Payer: Self-pay | Admitting: Internal Medicine

## 2018-10-03 DIAGNOSIS — Z1231 Encounter for screening mammogram for malignant neoplasm of breast: Secondary | ICD-10-CM

## 2018-10-07 ENCOUNTER — Ambulatory Visit
Admission: RE | Admit: 2018-10-07 | Discharge: 2018-10-07 | Disposition: A | Discharge status: Home / Self Care | Payer: 59 | Source: Ambulatory Visit | Attending: Internal Medicine | Admitting: Internal Medicine

## 2018-10-07 ENCOUNTER — Other Ambulatory Visit: Payer: 59 | Admitting: Internal Medicine

## 2018-10-07 DIAGNOSIS — R7302 Impaired glucose tolerance (oral): Secondary | ICD-10-CM | POA: Diagnosis not present

## 2018-10-07 DIAGNOSIS — Z1231 Encounter for screening mammogram for malignant neoplasm of breast: Secondary | ICD-10-CM

## 2018-10-07 DIAGNOSIS — E782 Mixed hyperlipidemia: Secondary | ICD-10-CM | POA: Diagnosis not present

## 2018-10-08 LAB — LIPID PANEL
Cholesterol: 226 mg/dL — ABNORMAL HIGH (ref <200)
HDL: 112 mg/dL (ref >50)
LDL Cholesterol (Calc): 87 mg/dL (calc)
Non-HDL Cholesterol (Calc): 114 mg/dL (calc) (ref <130)
Total CHOL/HDL Ratio: 2 (calc) (ref <5.0)
Triglycerides: 162 mg/dL — ABNORMAL HIGH (ref <150)

## 2018-10-08 LAB — HEPATIC FUNCTION PANEL
AG Ratio: 1.5 (calc) (ref 1.0–2.5)
ALT: 18 U/L (ref 6–29)
AST: 14 U/L (ref 10–35)
Albumin: 4 g/dL (ref 3.6–5.1)
Alkaline phosphatase (APISO): 64 U/L (ref 33–130)
Bilirubin, Direct: 0.1 mg/dL (ref 0.0–0.2)
Globulin: 2.6 g/dL (calc) (ref 1.9–3.7)
Indirect Bilirubin: 0.5 mg/dL (calc) (ref 0.2–1.2)
Total Bilirubin: 0.6 mg/dL (ref 0.2–1.2)
Total Protein: 6.6 g/dL (ref 6.1–8.1)

## 2018-10-08 LAB — HEMOGLOBIN A1C
Hgb A1c MFr Bld: 6 % of total Hgb — ABNORMAL HIGH (ref <5.7)
Mean Plasma Glucose: 126 (calc)
eAG (mmol/L): 7 (calc)

## 2018-10-09 ENCOUNTER — Ambulatory Visit: Payer: 59 | Admitting: Internal Medicine

## 2018-10-09 ENCOUNTER — Encounter: Payer: Self-pay | Admitting: Internal Medicine

## 2018-10-09 VITALS — BP 120/80 | HR 74 | Temp 98.3°F | Ht 62.5 in | Wt 152.0 lb

## 2018-10-09 DIAGNOSIS — E8881 Metabolic syndrome: Secondary | ICD-10-CM | POA: Diagnosis not present

## 2018-10-09 DIAGNOSIS — R7302 Impaired glucose tolerance (oral): Secondary | ICD-10-CM | POA: Diagnosis not present

## 2018-10-09 DIAGNOSIS — I10 Essential (primary) hypertension: Secondary | ICD-10-CM

## 2018-10-09 DIAGNOSIS — E782 Mixed hyperlipidemia: Secondary | ICD-10-CM

## 2018-10-09 MED ORDER — METFORMIN HCL 500 MG PO TABS: 500.0000 mg | ORAL_TABLET | Freq: Two times a day (BID) | ORAL | 3 refills | Status: DC

## 2018-10-09 NOTE — Progress Notes (Signed)
   Subjective:    Patient ID: Virginia [name], female    DOB: 1956-07-08, 63 y.o.   MRN: [id number]  HPI    Review of Systems     Objective:   Physical Exam        Assessment & Plan:  Impaired glucose tolerance-trial of metformin 500 mg daily.  Encourage watching diet and increasing exercise.  Hypertension currently on 100 mg of losartan daily along with HCTZ 25 mg daily.  Blood pressure under good control on current regimen.  Metabolic syndrome  Hyperlipidemia-currently on Crestor 5 mg 3 times weekly  Admits to eating a lot of chocolate recently.  Her triglycerides have increased from 88 4 months ago to 162.  However since starting Crestor 3 times weekly total cholesterol has decreased from 246 to 226.  LDL has decreased from 139 to 87 and liver panel is normal on statin. We could increase statin to daily but she does not want to do that.  Talked with her about cutting down on cheese consumption.  Generally has 1 glass of wine nightly. Recommend diet exercise and weight loss.  Follow-up in May for hemoglobin A1c and lipid panel with office visit

## 2018-10-09 NOTE — Patient Instructions (Addendum)
Continue Crestor 5 mg 3 times weekly.  Work on diet exercise and weight loss.  Continue antihypertensive medication.  Start metformin 500 mg daily for impaired glucose tolerance.  Return in 4 months

## 2018-10-10 ENCOUNTER — Telehealth: Payer: Self-pay | Admitting: Internal Medicine

## 2018-10-10 DIAGNOSIS — M542 Cervicalgia: Secondary | ICD-10-CM

## 2018-10-10 DIAGNOSIS — M25512 Pain in left shoulder: Secondary | ICD-10-CM

## 2018-10-10 NOTE — Addendum Note (Signed)
Addended by: Mady Haagensen on: 10/10/2018 03:00 PM   Modules accepted: Orders

## 2018-10-10 NOTE — Telephone Encounter (Signed)
She said it is the left side she wants to know if you think she should continue to see the chiropractor.  Ordered xray, advised to have xrays done and wait for results.

## 2018-10-10 NOTE — Telephone Encounter (Signed)
Patient was in here yesterday and she forgot to ask Dr Renold Genta if she could get a xray on her neck and shoulder blade because it has been hurting for a while and she has seen a chiropractor about it but it hasn't helped. [phone] is the best number to reach her back at.

## 2018-10-10 NOTE — Telephone Encounter (Signed)
Need to know which side is hurting. Please order c-spine and scapular film but need to know which side. It is unlikely to show much. May need to go to PT

## 2018-11-07 ENCOUNTER — Encounter: Payer: Self-pay | Admitting: Internal Medicine

## 2018-11-07 ENCOUNTER — Ambulatory Visit (INDEPENDENT_AMBULATORY_CARE_PROVIDER_SITE_OTHER): Payer: 59 | Admitting: Internal Medicine

## 2018-11-07 VITALS — BP 110/80 | HR 72 | Temp 98.5°F | Ht 62.5 in | Wt 152.0 lb

## 2018-11-07 DIAGNOSIS — J22 Unspecified acute lower respiratory infection: Secondary | ICD-10-CM

## 2018-11-07 DIAGNOSIS — H9209 Otalgia, unspecified ear: Secondary | ICD-10-CM

## 2018-11-07 DIAGNOSIS — J029 Acute pharyngitis, unspecified: Secondary | ICD-10-CM | POA: Diagnosis not present

## 2018-11-07 DIAGNOSIS — M79641 Pain in right hand: Secondary | ICD-10-CM | POA: Diagnosis not present

## 2018-11-07 DIAGNOSIS — M13841 Other specified arthritis, right hand: Secondary | ICD-10-CM | POA: Diagnosis not present

## 2018-11-07 DIAGNOSIS — R05 Cough: Secondary | ICD-10-CM | POA: Diagnosis not present

## 2018-11-07 DIAGNOSIS — R059 Cough, unspecified: Secondary | ICD-10-CM

## 2018-11-07 DIAGNOSIS — M720 Palmar fascial fibromatosis [Dupuytren]: Secondary | ICD-10-CM | POA: Diagnosis not present

## 2018-11-07 LAB — POCT RAPID STREP A (OFFICE): Rapid Strep A Screen: NEGATIVE

## 2018-11-07 MED ORDER — CLARITHROMYCIN 500 MG PO TABS: 500.0000 mg | ORAL_TABLET | Freq: Two times a day (BID) | ORAL | 0 refills | Status: DC

## 2018-11-07 MED ORDER — CEFTRIAXONE SODIUM 1 G IJ SOLR: 1.0000 g | Freq: Once | INTRAMUSCULAR | Status: DC

## 2018-11-07 MED ORDER — HYDROCODONE-HOMATROPINE 5-1.5 MG/5ML PO SYRP: 5.0000 mL | ORAL_SOLUTION | Freq: Three times a day (TID) | ORAL | 0 refills | Status: DC | PRN

## 2018-11-07 MED ORDER — CEFTRIAXONE SODIUM 1 G IJ SOLR
1.0000 g | Freq: Once | INTRAMUSCULAR | Status: AC
  Administered 2018-11-07: 1 g via INTRAMUSCULAR

## 2018-11-07 NOTE — Progress Notes (Signed)
   Subjective:    Patient ID: Virginia [name], female    DOB: 01-Sep-1956, 63 y.o.   MRN: [id number]  HPI Onset within the past couple of days of respiratory infection symptoms.  Has sore throat.  Has had cough, malaise and fatigue.  No fever or myalgias or shaking chills.  Coworker had similar illness. No travel history recently.   Review of Systems see above-main complaint is sore throat     Objective:   Physical Exam Blood pressure 110/80, pulse 72, temperature 98.5 degrees.  Pulse oximetry 98%.  Skin warm and dry.  She sounds nasally congested.  Neck is supple without adenopathy.  Pharynx is red without exudate.  Rapid strep screen is negative.  TMs are slightly full and pink.  Chest is clear to auscultation without rales or wheezing.       Assessment & Plan:  Acute lower respiratory infection  Acute pharyngitis  Plan: Hycodan 1 teaspoon p.o. every 8 hours as needed cough and sore throat pain.  Biaxin 500 mg twice daily for 10 days.  Rest and drink plenty of fluids.  1 g IM Rocephin.

## 2018-11-07 NOTE — Patient Instructions (Signed)
1 g IM Rocephin.  Biaxin 500 mg twice daily for 10 days.  Hycodan 1 teaspoon p.o. every 8 hours as needed cough.  Rest and drink plenty of fluids.

## 2018-11-18 ENCOUNTER — Telehealth: Payer: Self-pay

## 2018-11-18 NOTE — Telephone Encounter (Signed)
Patient called states she took valtrex last night around 8:00pm and her heart starting racing she had a slight headache and now her left arm is tingling but she has been having left shoulder pain. She said before bed she felt better and she was just calling bc of her age she doesn't have nausea, vomiting, chest pain, SOB or a headache. She wants to know if she needs to come see you?

## 2018-11-18 NOTE — Telephone Encounter (Signed)
I am not aware that valtrex causes heart to race. Could it be anxiety?Marland Kitchen Would just see how she feels and if worse go to ED. We do not have any appts. Today.

## 2018-11-18 NOTE — Telephone Encounter (Signed)
Pt was notified of Dr.Baxley's instructions, pt verbalized understanding.  She said it might be anxiety bc she hasn't been taking her xanax.

## 2018-11-24 ENCOUNTER — Other Ambulatory Visit: Payer: Self-pay | Admitting: Internal Medicine

## 2018-12-29 ENCOUNTER — Other Ambulatory Visit: Payer: Self-pay | Admitting: Internal Medicine

## 2019-01-06 ENCOUNTER — Other Ambulatory Visit: Payer: Self-pay | Admitting: Internal Medicine

## 2019-01-12 ENCOUNTER — Other Ambulatory Visit: Payer: Self-pay

## 2019-01-12 ENCOUNTER — Other Ambulatory Visit: Payer: 59 | Admitting: Internal Medicine

## 2019-01-12 DIAGNOSIS — R7302 Impaired glucose tolerance (oral): Secondary | ICD-10-CM | POA: Diagnosis not present

## 2019-01-12 DIAGNOSIS — E782 Mixed hyperlipidemia: Secondary | ICD-10-CM | POA: Diagnosis not present

## 2019-01-13 ENCOUNTER — Ambulatory Visit (INDEPENDENT_AMBULATORY_CARE_PROVIDER_SITE_OTHER): Payer: 59 | Admitting: Internal Medicine

## 2019-01-13 DIAGNOSIS — R7302 Impaired glucose tolerance (oral): Secondary | ICD-10-CM | POA: Diagnosis not present

## 2019-01-13 DIAGNOSIS — I10 Essential (primary) hypertension: Secondary | ICD-10-CM

## 2019-01-13 DIAGNOSIS — E782 Mixed hyperlipidemia: Secondary | ICD-10-CM

## 2019-01-13 DIAGNOSIS — E8881 Metabolic syndrome: Secondary | ICD-10-CM

## 2019-01-13 DIAGNOSIS — F411 Generalized anxiety disorder: Secondary | ICD-10-CM

## 2019-01-13 LAB — LIPID PANEL
Cholesterol: 235 mg/dL — ABNORMAL HIGH (ref <200)
HDL: 92 mg/dL (ref >=50)
LDL Cholesterol (Calc): 117 mg/dL (calc) — ABNORMAL HIGH
Non-HDL Cholesterol (Calc): 143 mg/dL (calc) — ABNORMAL HIGH (ref <130)
Total CHOL/HDL Ratio: 2.6 (calc) (ref <5.0)
Triglycerides: 145 mg/dL (ref <150)

## 2019-01-13 LAB — HEMOGLOBIN A1C
Hgb A1c MFr Bld: 6 % of total Hgb — ABNORMAL HIGH (ref <5.7)
Mean Plasma Glucose: 126 (calc)
eAG (mmol/L): 7 (calc)

## 2019-02-08 NOTE — Progress Notes (Signed)
   Subjective:    Patient ID: Virginia [name], female    DOB: Sep 09, 1956, 63 y.o.   MRN: [id number]  HPI 63 year old Female in today to follow-up on impaired glucose tolerance.  In January was placed on metformin 500 mg daily and told to watch diet and increase exercise level.  History of hypertension treated with losartan HCTZ.  History of hyperlipidemia currently on Crestor 5 mg 3 times weekly.  The pandemic is affecting her travel plans for the future.  She and her husband usually go to [place] and make several couples they are on an annual basis.  They also had to go to [place].  She is seen today by [employer] due to the coronavirus pandemic.  She is identified using 2 identifiers as Virginia [name]. [place], a longstanding patient in this practice.  She is agreeable to visit in this format today.  Reports her blood pressure to be stable.  On May 4 and hemoglobin A1c drawn and it is stable at 6%.  Will not change medication for impaired glucose tolerance at this point in time.  Unfortunately total cholesterol is 235, HDL cholesterol 92, triglycerides 145 and LDL cholesterol elevated at 117.  4 months ago LDL was was 87 and total cholesterol was 226 with triglycerides of 162.  Not able to get out and exercise quite as much with pandemic and shelter in place orders.  We will continue with Crestor 5 mg 3 times weekly.  No other complaints or concerns voiced today.  She does take Xanax for anxiety.  This was refilled in April.  She has a history of GE reflux treated with Protonix.  Her health maintenance exam was due in March.  We can plan to do this late Summer or early Fall.    Review of Systems no other complaints     Objective:   Physical Exam  Reports blood pressure to be stable at home on current regimen of HCTZ and losartan      Assessment & Plan:  Essential hypertension  Mixed hyperlipidemia  History of anxiety  GE reflux  Plan: Would  encourage diet and exercise during the pandemic.  Continue Crestor 5 mg 3 times weekly, continue losartan HCTZ, continue Xanax as needed for anxiety and PPI for reflux.  Continue with metformin 500 mg daily.  Would like to see hemoglobin A1c less than 6%.  Needs health maintenance visit and physical exam in the next few months.

## 2019-02-25 ENCOUNTER — Encounter: Payer: Self-pay | Admitting: Internal Medicine

## 2019-02-25 NOTE — Patient Instructions (Signed)
Try to watch diet and get more exercise.  Book physical exam in the next 3 months or so.  Continue current medications

## 2019-03-23 ENCOUNTER — Telehealth: Payer: Self-pay | Admitting: Internal Medicine

## 2019-03-23 NOTE — Telephone Encounter (Signed)
I think it is best that we refer her on to GYN for an exam. Ask her where she would like to go.

## 2019-03-23 NOTE — Telephone Encounter (Addendum)
Virginia [name] [phone]  Lamar called to say she is hurting in her bottom right ovary area, this has been going on for a while, she went to the [facility] urgent care on 03/13/2019 with a UTI, she is going to call them and ask to fax office notes to [name]. She would like to come to office to be seen. No GYN

## 2019-03-23 NOTE — Telephone Encounter (Signed)
Called and spoke with Virginia [name] she is going to call back with GYN information, of where she wants to go.

## 2019-04-02 ENCOUNTER — Encounter: Payer: Self-pay | Admitting: Physician Assistant

## 2019-04-19 ENCOUNTER — Other Ambulatory Visit: Payer: Self-pay | Admitting: Internal Medicine

## 2019-04-23 ENCOUNTER — Ambulatory Visit: Payer: 59 | Admitting: Physician Assistant

## 2019-05-04 ENCOUNTER — Ambulatory Visit (INDEPENDENT_AMBULATORY_CARE_PROVIDER_SITE_OTHER): Payer: 59 | Admitting: Internal Medicine

## 2019-05-04 ENCOUNTER — Other Ambulatory Visit: Payer: Self-pay

## 2019-05-04 ENCOUNTER — Encounter: Payer: Self-pay | Admitting: Internal Medicine

## 2019-05-04 ENCOUNTER — Telehealth: Payer: Self-pay | Admitting: Internal Medicine

## 2019-05-04 VITALS — BP 120/80 | HR 74 | Temp 98.4°F | Ht 62.0 in | Wt 155.0 lb

## 2019-05-04 DIAGNOSIS — R8290 Unspecified abnormal findings in urine: Secondary | ICD-10-CM | POA: Diagnosis not present

## 2019-05-04 DIAGNOSIS — R350 Frequency of micturition: Secondary | ICD-10-CM

## 2019-05-04 LAB — POCT URINALYSIS DIPSTICK
Bilirubin, UA: NEGATIVE
Glucose, UA: NEGATIVE
Ketones, UA: NEGATIVE
Nitrite, UA: NEGATIVE
Protein, UA: POSITIVE — AB
Spec Grav, UA: 1.01 (ref 1.010–1.025)
Urobilinogen, UA: 0.2 E.U./dL
pH, UA: 7.5 (ref 5.0–8.0)

## 2019-05-04 MED ORDER — CIPROFLOXACIN HCL 500 MG PO TABS: 500.0000 mg | ORAL_TABLET | Freq: Two times a day (BID) | ORAL | 0 refills | Status: DC

## 2019-05-04 NOTE — Telephone Encounter (Signed)
Virginia [name] [phone]  [name] called to say she is expercing frequent urge for urination, feels uncomfortable like she did last month. Thinks it might be new perfume. She has no insurance at this time.

## 2019-05-04 NOTE — Progress Notes (Signed)
   Subjective:    Patient ID: Virginia [name], female    DOB: [birth year], 63 y.o.   MRN: [id number]  HPI 63 year old Female seen today with complaint of dysuria. Has been using perfumed spray in genital area. Was seen at [facility] July 3 of this year with similar symptoms.  Had 2+ LE on urine specimen and culture grew 25,000-50,000 CFUs sensitive to Macrobid which she was treated with.  Patient called today with recurrence of symptoms.  Has no fever, shaking chills nausea or vomiting just urinary discomfort.    Review of Systems see above     Objective:   Physical Exam No acute distress.  Dipstick UA shows 2+ LE.  Culture was sent.  No CVA tenderness. Temperature 98.4 degrees only blood pressure 120/80 pulse 74 weight 155 pounds      Assessment & Plan:  Acute urinary tract infection  Plan: Cipro 500 mg twice daily for 7 days.  Avoid using perfume sprays in genital area.

## 2019-05-04 NOTE — Telephone Encounter (Signed)
Patient will come at 4:20 and she will try to find the medication name that she was prescribed.

## 2019-05-04 NOTE — Telephone Encounter (Signed)
She was prescribed macrobid BID for 7 days.

## 2019-05-04 NOTE — Patient Instructions (Signed)
Avoid using perfumed sprays in genital area.  Cipro 500 mg twice daily for 7 days.  Culture pending.

## 2019-05-04 NOTE — Telephone Encounter (Signed)
We do not have the notes from [facility] urgent care where she was seen before. Can she come for a nurse visit with urine specimen? Does she know what med they gave her?

## 2019-05-06 LAB — URINE CULTURE
MICRO NUMBER:: 803499
SPECIMEN QUALITY:: ADEQUATE

## 2019-05-27 ENCOUNTER — Other Ambulatory Visit: Payer: Self-pay | Admitting: Internal Medicine

## 2019-06-12 ENCOUNTER — Other Ambulatory Visit: Payer: Self-pay

## 2019-06-12 ENCOUNTER — Telehealth: Payer: Self-pay | Admitting: Internal Medicine

## 2019-06-12 ENCOUNTER — Ambulatory Visit (INDEPENDENT_AMBULATORY_CARE_PROVIDER_SITE_OTHER): Payer: Self-pay | Admitting: Internal Medicine

## 2019-06-12 ENCOUNTER — Encounter: Payer: Self-pay | Admitting: Internal Medicine

## 2019-06-12 VITALS — BP 120/80 | HR 75 | Temp 98.0°F | Ht 62.0 in | Wt 158.0 lb

## 2019-06-12 DIAGNOSIS — R8290 Unspecified abnormal findings in urine: Secondary | ICD-10-CM

## 2019-06-12 DIAGNOSIS — N390 Urinary tract infection, site not specified: Secondary | ICD-10-CM

## 2019-06-12 DIAGNOSIS — R350 Frequency of micturition: Secondary | ICD-10-CM

## 2019-06-12 LAB — POCT URINALYSIS DIPSTICK
Bilirubin, UA: NEGATIVE
Glucose, UA: NEGATIVE
Ketones, UA: NEGATIVE
Nitrite, UA: NEGATIVE
Protein, UA: NEGATIVE
Spec Grav, UA: 1.01 (ref 1.010–1.025)
Urobilinogen, UA: 0.2 E.U./dL
pH, UA: 6.5 (ref 5.0–8.0)

## 2019-06-12 MED ORDER — CIPROFLOXACIN HCL 500 MG PO TABS: 500.0000 mg | ORAL_TABLET | Freq: Two times a day (BID) | ORAL | 0 refills | Status: DC

## 2019-06-12 NOTE — Telephone Encounter (Signed)
She may come today

## 2019-06-12 NOTE — Progress Notes (Signed)
   Subjective:    Patient ID: Virginia [name], female    DOB: 1956-08-26, 63 y.o.   MRN: [id number]  HPI Onset today of dysuria. Had E.coli UTI in August sensitive to Cipro which she was treated with at that time. Symptoms may be related to sexual intercourse. Does not have frequent intercourse but patient has noted an association of symptoms recently with intercourse.  No fever or chills.  No nausea or vomiting.    Review of Systems     Objective:   Physical Exam  No CVA tenderness.  Vital signs reviewed.  No acute distress.  Dipstick UA shows small LE and moderate occult blood    Assessment & Plan:  Dysuria-possible UTI with possible relationship to intercourse  Plan: Cover with Cipro once again 500 mg twice daily for 10 days.  Urine culture pending.  Addendum: Urine culture showed less than 10,000 colony-forming units per milliliter.

## 2019-06-12 NOTE — Patient Instructions (Signed)
Urine culture ordered.  Cipro 500 mg twice daily for 10 days.  Return in 2 weeks.  Urinate after intercourse.  May need prophylactic antibiotic after intercourse.

## 2019-06-12 NOTE — Telephone Encounter (Signed)
Pt thinks she might have a UTI and wanted to know if she could get an appt today

## 2019-06-13 LAB — URINE CULTURE
MICRO NUMBER:: 948805
SPECIMEN QUALITY:: ADEQUATE

## 2019-06-25 ENCOUNTER — Ambulatory Visit: Payer: Self-pay | Admitting: Internal Medicine

## 2019-06-26 ENCOUNTER — Ambulatory Visit: Payer: Self-pay | Admitting: Internal Medicine

## 2019-08-12 ENCOUNTER — Other Ambulatory Visit: Payer: Self-pay

## 2019-08-12 ENCOUNTER — Telehealth: Payer: Self-pay | Admitting: Internal Medicine

## 2019-08-12 MED ORDER — LOSARTAN POTASSIUM 100 MG PO TABS: ORAL_TABLET | ORAL | 0 refills | Status: DC

## 2019-08-12 MED ORDER — HYDROCHLOROTHIAZIDE 25 MG PO TABS: ORAL_TABLET | ORAL | 0 refills | Status: DC

## 2019-08-12 NOTE — Telephone Encounter (Signed)
Received Fax RX request from  [facility]  Medication - losartan (COZAAR) 100 MG  Wants 90 day supply  Last Refill -   Last OV - 06/12/19  Last CPE - 11/18/17  Next Appointment -

## 2019-08-21 ENCOUNTER — Ambulatory Visit (INDEPENDENT_AMBULATORY_CARE_PROVIDER_SITE_OTHER): Payer: 59 | Admitting: Internal Medicine

## 2019-08-21 ENCOUNTER — Telehealth: Payer: Self-pay

## 2019-08-21 ENCOUNTER — Other Ambulatory Visit: Payer: Self-pay

## 2019-08-21 DIAGNOSIS — R062 Wheezing: Secondary | ICD-10-CM

## 2019-08-21 DIAGNOSIS — J9801 Acute bronchospasm: Secondary | ICD-10-CM

## 2019-08-21 DIAGNOSIS — J22 Unspecified acute lower respiratory infection: Secondary | ICD-10-CM

## 2019-08-21 DIAGNOSIS — Z87891 Personal history of nicotine dependence: Secondary | ICD-10-CM

## 2019-08-21 MED ORDER — PREDNISONE 10 MG PO TABS: ORAL_TABLET | ORAL | 0 refills | Status: DC

## 2019-08-21 MED ORDER — HYDROCODONE-HOMATROPINE 5-1.5 MG/5ML PO SYRP: 5.0000 mL | ORAL_SOLUTION | Freq: Three times a day (TID) | ORAL | 0 refills | Status: DC | PRN

## 2019-08-21 MED ORDER — CLARITHROMYCIN 500 MG PO TABS: 500.0000 mg | ORAL_TABLET | Freq: Two times a day (BID) | ORAL | 0 refills | Status: DC

## 2019-08-21 NOTE — Telephone Encounter (Signed)
Patient called has been coughing and wheezing for 2 weeks now, she said her chest hurts when breathing. She also has sinus pressure and a headache. She said she has been checking her temperature at work and it has been normal. She is at home right now and does not have a thermometer. She would to be treated for this. Please advise.

## 2019-08-21 NOTE — Telephone Encounter (Signed)
Can set up virtual visit this afternoon

## 2019-09-07 MED ORDER — ALBUTEROL SULFATE HFA 108 (90 BASE) MCG/ACT IN AERS: 2.0000 | INHALATION_SPRAY | Freq: Four times a day (QID) | RESPIRATORY_TRACT | 0 refills | Status: DC | PRN

## 2019-09-07 NOTE — Addendum Note (Signed)
Addended by: Mady Haagensen on: 09/07/2019 04:59 PM   Modules accepted: Orders

## 2019-09-07 NOTE — Telephone Encounter (Signed)
X-ray has been ordered

## 2019-09-07 NOTE — Telephone Encounter (Signed)
Ask her to go get CXR- Araceli can order stat and set up virtual visit for am. Does she have an inhaler?

## 2019-09-07 NOTE — Telephone Encounter (Signed)
Virginia [name] called to say that she finished her antibiotic and prednisone last Wednesday or Thursday before Christmas, however she is still coughing and weak, when she goes up the stairs or try to do some house cleaning she can hear herself wheezing, no fever since last Sunday.

## 2019-09-07 NOTE — Telephone Encounter (Signed)
Scheduled virtual visit in am

## 2019-09-08 ENCOUNTER — Ambulatory Visit
Admission: RE | Admit: 2019-09-08 | Discharge: 2019-09-08 | Disposition: A | Discharge status: Home / Self Care | Payer: Self-pay | Source: Ambulatory Visit | Attending: Internal Medicine | Admitting: Internal Medicine

## 2019-09-08 ENCOUNTER — Ambulatory Visit (INDEPENDENT_AMBULATORY_CARE_PROVIDER_SITE_OTHER): Payer: Self-pay | Admitting: Internal Medicine

## 2019-09-08 ENCOUNTER — Other Ambulatory Visit: Payer: Self-pay

## 2019-09-08 ENCOUNTER — Encounter: Payer: Self-pay | Admitting: Internal Medicine

## 2019-09-08 VITALS — Temp 97.7°F | Ht 62.0 in | Wt 158.0 lb

## 2019-09-08 DIAGNOSIS — J189 Pneumonia, unspecified organism: Secondary | ICD-10-CM

## 2019-09-08 DIAGNOSIS — R062 Wheezing: Secondary | ICD-10-CM

## 2019-09-08 MED ORDER — LEVOFLOXACIN 500 MG PO TABS: 500.0000 mg | ORAL_TABLET | Freq: Every day | ORAL | 0 refills | Status: DC

## 2019-09-08 MED ORDER — PREDNISONE 10 MG PO TABS: ORAL_TABLET | ORAL | 0 refills | Status: DC

## 2019-09-08 MED ORDER — HYDROCODONE-HOMATROPINE 5-1.5 MG/5ML PO SYRP: 5.0000 mL | ORAL_SOLUTION | Freq: Three times a day (TID) | ORAL | 0 refills | Status: DC | PRN

## 2019-09-08 NOTE — Patient Instructions (Signed)
Change Biaxin to Levaquin 500 mg daily with a meal for 10 days.  Repeat tapering course of prednisone.  Refill Hycodan for cough.  Samples of Breztri for bronchospasm.  It is important she get follow-up chest x-ray in mid January.

## 2019-09-08 NOTE — Progress Notes (Signed)
   Subjective:    Patient ID: Virginia [name], female    DOB: 07-16-1956, 63 y.o.   MRN: [id number]  HPI 63 year old Female seen initially seen December 11 with respiratory infection.  Had 2 weeks history of wheezing without fever.  Says her throat was red but not so warm.  Daughter from [place] had visited her for Thanksgiving.  Husband does not feel.  She was treated with Biaxin 500 mg twice daily for 10 days, Hycodan 1 teaspoon p.o. every 6-8 hours as needed cough and a tapering course of prednisone 10 mg going from 60 mg to 0 mg over 7 days.  She had considerable malaise and fatigue for several days.  Had pressure behind her eyes.  Said cough was not productive.  She called yesterday regarding persistent wheezing and chest x-ray was advised.  Chest x-ray shows volume loss in the right lower lobe with opacity perihilar area on the right.  A CT was recommended the patient currently does not have health insurance.  Denies productive cough just persistent issues with wheezing.  History of smoking for over 20 years.  She quit but then resumed at one point.  No known drug allergies.  History of hyperlipidemia.  History of GE reflux.  History of mild impaired glucose tolerance.  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 they are using Good Rx card through [facility] for medications  Review of Systems see above- no nausea, vomiting, myalgias, headache-says she has felt better today than she has felt in several weeks.     Objective:   Physical Exam  Seen virtually today in no acute distress.  Reports that she is afebrile.  Not coughing on virtual visit today.      Assessment & Plan:   Right lower lobe pneumonia-?  Right perihilar mass but patient wants to defer CT because she does not have health insurance at  present.  Bronchospasm-patient reports persistent wheezing  Plan:  Prednisone tapering course 10 mg tablets (# 21)   going from 60 mg to 0 mg over 7 days( 6-5-4-3-2-1 taper)  Levaquin 500 mg daily for 10 days.  Take with food.  Refill Hycodan for cough 1 teaspoon p.o. every 8 hours as needed   Samples of Breztri provided for bronchospasm  Needs follow-up chest x-ray mid January.  Rest and drink plenty of fluids.

## 2019-09-09 ENCOUNTER — Encounter: Payer: Self-pay | Admitting: Internal Medicine

## 2019-09-09 NOTE — Progress Notes (Signed)
   Subjective:    Patient ID: Virginia [name], female    DOB: [birth year], 63 y.o.   MRN: [id number]  HPI 63 year old Female seen today by interactive audio [employer] video telecommunications due to the Coronavirus pandemic.  She is identified using 2 identifiers as Virginia [name]. [name], a patient in this practice.  She is agreeable to visit in this format today.  Patient has developed cough and slight wheezing.  No known COVID-19 exposure.  Says her [place] but was not ill.  Patient denies fever and shaking chills.  Last chest x-ray was in 2018 and was normal.  She has a history of smoking for over 20 years.  Social history: She is married.  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  Social alcohol consumption.  3 adult children, 2 daughters and a son.  Family history: Father died at age 66 of an MI.  1 sister died of complications of HIV.  Another sister died of cancer.  1 sister and 1 brother living  Last physical exam was March 2019.  History of impaired glucose tolerance with hemoglobin A1c 6% in September 2019  In May 2020 lipid panel showed total cholesterol 235, LDL cholesterol 117, HDL 92 with triglycerides 145.  Metformin has been prescribed for glucose intolerance.  Crestor has been prescribed 3 times weekly for hyperlipidemia.  History of GE reflux treated with Protonix.  Hypertension treated with losartan and HCTZ  Review of Systems     Objective:   Physical Exam Seen virtually.  Reports she is afebrile.  Does not want to have COVID-19 testing.  Further trial of antibiotics and prednisone at this time.       Assessment & Plan:  Acute lower respiratory infection  Bronchospasm  Plan: Biaxin 500 mg twice daily for 10 days.  Hycodan 1 teaspoon p.o. every 8 hours as needed cough.  Rest and drink plenty of fluids and call if not improving.

## 2019-09-09 NOTE — Patient Instructions (Addendum)
Hycodan 1 teaspoon p.o. every 8 hours as needed cough.  Biaxin 500 mg twice daily for 10 days.  Rest and drink plenty of fluids.  Call if not improving.

## 2019-09-11 DIAGNOSIS — I82409 Acute embolism and thrombosis of unspecified deep veins of unspecified lower extremity: Secondary | ICD-10-CM

## 2019-09-11 HISTORY — DX: Acute embolism and thrombosis of unspecified deep veins of unspecified lower extremity: I82.409

## 2019-09-18 ENCOUNTER — Telehealth: Payer: Self-pay | Admitting: Internal Medicine

## 2019-09-18 DIAGNOSIS — R059 Cough, unspecified: Secondary | ICD-10-CM

## 2019-09-18 DIAGNOSIS — R05 Cough: Secondary | ICD-10-CM

## 2019-09-18 DIAGNOSIS — R053 Chronic cough: Secondary | ICD-10-CM

## 2019-09-18 DIAGNOSIS — R042 Hemoptysis: Secondary | ICD-10-CM

## 2019-09-18 NOTE — Telephone Encounter (Signed)
Patient called to say she is better but still wheezing some. Has inhaler sample we gave her from last visit. Not using it every day but I suggested that she start using it daily. Has finished prednisone. Needs follow up on abnormal CXR but may be a bit too soon today. I would prefer CT of chest but pt does not have insurance.Today I  have asked her to call [facility] about out of pocket price. At least needs repeat CXR late next week. Explained that I was concerned as radiologist raised possibility of mass in right lung.

## 2019-09-24 ENCOUNTER — Other Ambulatory Visit: Payer: Self-pay

## 2019-09-24 ENCOUNTER — Ambulatory Visit (HOSPITAL_COMMUNITY)
Admission: RE | Admit: 2019-09-24 | Discharge: 2019-09-24 | Disposition: A | Discharge status: Home / Self Care | Payer: Self-pay | Source: Ambulatory Visit | Attending: Internal Medicine | Admitting: Internal Medicine

## 2019-09-24 DIAGNOSIS — R053 Chronic cough: Secondary | ICD-10-CM

## 2019-09-24 DIAGNOSIS — R042 Hemoptysis: Secondary | ICD-10-CM | POA: Insufficient documentation

## 2019-09-24 DIAGNOSIS — R05 Cough: Secondary | ICD-10-CM | POA: Insufficient documentation

## 2019-09-24 LAB — POCT I-STAT CREATININE: Creatinine, Ser: 0.5 mg/dL (ref 0.44–1.00)

## 2019-09-24 MED ORDER — IOHEXOL 300 MG/ML  SOLN
75.0000 mL | Freq: Once | INTRAMUSCULAR | Status: AC | PRN
  Administered 2019-09-24: 75 mL via INTRAVENOUS

## 2019-09-24 NOTE — Telephone Encounter (Signed)
Patient called back to say she would like to go ahead and get CT like you guys talked about. She is worried and still coughing up blood. No insurance

## 2019-09-24 NOTE — Telephone Encounter (Signed)
Scheduled patient for chest STAT CT scan this morning.

## 2019-09-24 NOTE — Addendum Note (Signed)
Addended by: Mady Haagensen on: 09/24/2019 10:01 AM   Modules accepted: Orders

## 2019-09-24 NOTE — Telephone Encounter (Signed)
Can you get walk in CT of chest with contrast. Has no insurance and will need to pay out of pocket. Would like to get this today or tomorrow so please call them. Concern for lung mass with persistent cough and hemoptysis.

## 2019-09-25 ENCOUNTER — Ambulatory Visit (INDEPENDENT_AMBULATORY_CARE_PROVIDER_SITE_OTHER): Payer: Self-pay | Admitting: Emergency Medicine

## 2019-09-25 ENCOUNTER — Encounter: Payer: Self-pay | Admitting: Emergency Medicine

## 2019-09-25 ENCOUNTER — Other Ambulatory Visit: Payer: Self-pay | Admitting: Internal Medicine

## 2019-09-25 ENCOUNTER — Telehealth: Payer: Self-pay | Admitting: Emergency Medicine

## 2019-09-25 VITALS — BP 124/76 | HR 80 | Temp 97.8°F | Ht 62.0 in | Wt 157.2 lb

## 2019-09-25 DIAGNOSIS — R918 Other nonspecific abnormal finding of lung field: Secondary | ICD-10-CM | POA: Insufficient documentation

## 2019-09-25 DIAGNOSIS — J449 Chronic obstructive pulmonary disease, unspecified: Secondary | ICD-10-CM | POA: Insufficient documentation

## 2019-09-25 NOTE — Telephone Encounter (Signed)
Order placed

## 2019-09-25 NOTE — Assessment & Plan Note (Signed)
With associated mediastinal and perihilar lymphadenopathy.  Concerning for primary lung cancer.  There is a 1.5 cm left paratracheal node that would potentially upstage her.  She needs tissue diagnosis soon as we can arrange.  I recommend bronchoscopy with endobronchial ultrasound so that we can stage the mediastinum.  I can tentatively arrange for 1/27.  I will also arrange for a PET scan for further staging purposes.

## 2019-09-25 NOTE — Progress Notes (Signed)
Subjective:    Patient ID: Virginia [name], female    DOB: 1956-07-24, 64 y.o.   MRN: [id number]  HPI 64 year old former smoker (20-30 pack years) with a history of hypertension, GERD, anxiety.  She is followed by Dr. Renold Genta.  She is referred today for abnormal chest imaging and a right lower lobe mass. Patient reports that she began to have weakness, wheeze and non-productive cough in December. She also rarely saw some blood-tinged sputum, treated w abx and prednisone. May have improved some but still SOB, still wheeze, pink sputum. Prompted CXR 12/29 that showed RLL volume loss, possible hilar mass. A Ct chest 1/15 reviewed by me, shows a proximal right lower lobe mass, 5.7 cm with some central clearing.  Patchy postobstructive infiltrate consistent with possible postobstructive pneumonia/pneumonitis.  There is right paratracheal, prevascular and right hilar lymphadenopathy.  Also a 1.5 cm left paratracheal node.  Several subcentimeter solid pulmonary nodules identified bilaterally of unclear significance.  She works, has good exercise tolerance. She was just started on breztri, albuterol, has benefited some.    Review of Systems  Constitutional: Negative for fever and unexpected weight change.  HENT: Positive for nosebleeds. Negative for congestion, dental problem, ear pain, postnasal drip, rhinorrhea, sinus pressure, sneezing, sore throat and trouble swallowing.   Eyes: Negative for redness and itching.  Respiratory: Positive for cough, shortness of breath and wheezing. Negative for chest tightness.   Cardiovascular: Negative for palpitations and leg swelling.  Gastrointestinal: Negative for nausea and vomiting.  Genitourinary: Negative for dysuria.  Musculoskeletal: Negative for joint swelling.  Skin: Negative for rash.  Allergic/Immunologic: Negative.  Negative for environmental allergies, food allergies and immunocompromised state.  Neurological: Positive for headaches.   Hematological: Does not bruise/bleed easily.  Psychiatric/Behavioral: Negative for dysphoric mood. The patient is not nervous/anxious.     Past Medical History:  Diagnosis Date  . Anxiety   . GERD (gastroesophageal reflux disease)   . Hypercholesterolemia    per pt, she does not have elevated lipids  . Hypertension   . Tobacco abuse      Family History  Problem Relation Age of Onset  . Heart disease Father   . Drug abuse Daughter   . Drug abuse Son   . Cancer Sister   . Heart disease Brother   . Heart attack Brother      Social History   Socioeconomic History  . Marital status: Married    Spouse name: Not on file  . Number of children: 3  . Years of education: Not on file  . Highest education level: Not on file  Occupational History  . Occupation: owns Medical illustrator: [employer]  Tobacco Use  . Smoking status: Former Smoker    Packs/day: 0.25    Quit date: 10/31/2014    Years since quitting: 4.9  . Smokeless tobacco: Never Used  Substance and Sexual Activity  . Alcohol use: Yes    Alcohol/week: 0.0 standard drinks    Comment: rare  . Drug use: No  . Sexual activity: Yes  Other Topics Concern  . Not on file  Social History Narrative  . Not on file   Social Determinants of Health   Financial Resource Strain:   . Difficulty of Paying Living Expenses: Not on file  Food Insecurity:   . Worried About Charity fundraiser in the Last Year: Not on file  . Ran Out of Food in the Last Year: Not on file  Transportation  Needs:   . Lack of Transportation (Medical): Not on file  . Lack of Transportation (Non-Medical): Not on file  Physical Activity:   . Days of Exercise per Week: Not on file  . Minutes of Exercise per Session: Not on file  Stress:   . Feeling of Stress : Not on file  Social Connections:   . Frequency of Communication with Friends and Family: Not on file  . Frequency of Social Gatherings with Friends and Family: Not on file  .  Attends Religious Services: Not on file  . Active Member of Clubs or Organizations: Not on file  . Attends Archivist Meetings: Not on file  . Marital Status: Not on file  Intimate Partner Violence:   . Fear of Current or Ex-Partner: Not on file  . Emotionally Abused: Not on file  . Physically Abused: Not on file  . Sexually Abused: Not on file     No Known Allergies   Outpatient Medications Prior to Visit  Medication Sig Dispense Refill  . albuterol (VENTOLIN HFA) 108 (90 Base) MCG/ACT inhaler Inhale 2 puffs into the lungs every 6 (six) hours as needed for wheezing or shortness of breath. 8 g 0  . ALPRAZolam (XANAX) 0.25 MG tablet TAKE 1 TABLET BY MOUTH TWICE DAILY AS NEEDED FOR ANXIETY 60 tablet 5  . clarithromycin (BIAXIN) 500 MG tablet Take 1 tablet (500 mg total) by mouth 2 (two) times daily. 20 tablet 0  . hydrochlorothiazide (HYDRODIURIL) 25 MG tablet TAKE 1 TABLET(25 MG) BY MOUTH DAILY 90 tablet 0  . HYDROcodone-homatropine (HYCODAN) 5-1.5 MG/5ML syrup Take 5 mLs by mouth every 8 (eight) hours as needed for cough. 120 mL 0  . ibuprofen (ADVIL,MOTRIN) 800 MG tablet take 1 tablet by mouth every 8 hours if needed for pain 90 tablet 1  . levofloxacin (LEVAQUIN) 500 MG tablet Take 1 tablet (500 mg total) by mouth daily. 7 tablet 0  . losartan (COZAAR) 100 MG tablet TAKE 1 TABLET(100 MG) BY MOUTH DAILY 90 tablet 0  . metFORMIN (GLUCOPHAGE) 500 MG tablet Take 1 tablet (500 mg total) by mouth 2 (two) times daily with a meal. 90 tablet 3  . OVER THE COUNTER MEDICATION Apply 1 application topically daily as needed. [place] Gel OTC. As needed for shoulder pain.    . pantoprazole (PROTONIX) 40 MG tablet TAKE 1 TABLET(40 MG) BY MOUTH DAILY 90 tablet 3  . psyllium (METAMUCIL) 58.6 % powder Take 1 packet by mouth daily.    . rosuvastatin (CRESTOR) 5 MG tablet TAKE 1 TABLET BY MOUTH THREE TIMES WEEKLY AS DIRECTED 36 tablet 1  . predniSONE (DELTASONE) 10 MG tablet TAKE IN TAPERING COURSE  6,5,4,3,2,1 21 tablet 0  . valACYclovir (VALTREX) 1000 MG tablet Take 1,000 mg by mouth as needed.      No facility-administered medications prior to visit.        Objective:   Physical Exam  Vitals:   09/25/19 1025  BP: 124/76  Pulse: 80  Temp: 97.8 F (36.6 C)  TempSrc: Temporal  SpO2: 95%  Weight: 157 lb 3.2 oz (71.3 kg)  Height: 5\' 2"  (1.575 m)   Gen: Pleasant, well-nourished, in no distress, a bit tearful  ENT: No lesions,  mouth clear,  oropharynx clear, no postnasal drip  Neck: No JVD, no stridor  Lungs: No use of accessory muscles, inspiratory squeaks and expiratory rhonchi on the right, clear on the left  Cardiovascular: RRR, heart sounds normal, no murmur or gallops, no  peripheral edema  Musculoskeletal: No deformities, no cyanosis or clubbing  Neuro: alert, awake, non focal  Skin: Warm, no lesions or rash     Assessment & Plan:  Right lower lobe lung mass With associated mediastinal and perihilar lymphadenopathy.  Concerning for primary lung cancer.  There is a 1.5 cm left paratracheal node that would potentially upstage her.  She needs tissue diagnosis soon as we can arrange.  I recommend bronchoscopy with endobronchial ultrasound so that we can stage the mediastinum.  I can tentatively arrange for 1/27.  I will also arrange for a PET scan for further staging purposes.  COPD (chronic obstructive pulmonary disease) ([place]) Presumed diagnosis based on her tobacco history, her current symptoms but also an apparent acute exacerbation from December 2019 that responded to antibiotics and prednisone.  She was started on breztri and albuterol, seems to be benefiting. Will continue for now.    Baltazar Apo, MD, PhD 09/25/2019, 11:59 AM [facility] [phone] or if no answer [phone]

## 2019-09-25 NOTE — Assessment & Plan Note (Signed)
Presumed diagnosis based on her tobacco history, her current symptoms but also an apparent acute exacerbation from December 2019 that responded to antibiotics and prednisone.  She was started on breztri and albuterol, seems to be benefiting. Will continue for now.

## 2019-09-25 NOTE — Patient Instructions (Addendum)
We will arrange for bronchoscopy to be done as an outpatient at [facility], goal for 10/07/2019.  Details regarding arrival time etc. will be coming in the next couple weeks. We will arrange for a PET scan Okay to continue Breztri as you have been taking it Follow with Dr Lamonte Sakai in 1 month

## 2019-09-28 DIAGNOSIS — S060X9A Concussion with loss of consciousness of unspecified duration, initial encounter: Secondary | ICD-10-CM

## 2019-09-28 DIAGNOSIS — S060XAA Concussion with loss of consciousness status unknown, initial encounter: Secondary | ICD-10-CM

## 2019-09-28 HISTORY — DX: Concussion with loss of consciousness status unknown, initial encounter: S06.0XAA

## 2019-09-28 HISTORY — DX: Concussion with loss of consciousness of unspecified duration, initial encounter: S06.0X9A

## 2019-09-29 ENCOUNTER — Encounter (HOSPITAL_COMMUNITY): Payer: Self-pay

## 2019-09-29 ENCOUNTER — Telehealth: Payer: Self-pay | Admitting: Emergency Medicine

## 2019-09-29 ENCOUNTER — Emergency Department (HOSPITAL_COMMUNITY)
Admission: EM | Admit: 2019-09-29 | Discharge: 2019-09-29 | Disposition: A | Discharge status: Home / Self Care | Payer: Self-pay | Attending: Emergency Medicine | Admitting: Emergency Medicine

## 2019-09-29 ENCOUNTER — Emergency Department (HOSPITAL_COMMUNITY): Payer: Self-pay

## 2019-09-29 ENCOUNTER — Encounter: Payer: Self-pay | Admitting: Internal Medicine

## 2019-09-29 ENCOUNTER — Telehealth (INDEPENDENT_AMBULATORY_CARE_PROVIDER_SITE_OTHER): Payer: Self-pay | Admitting: Internal Medicine

## 2019-09-29 DIAGNOSIS — W108XXA Fall (on) (from) other stairs and steps, initial encounter: Secondary | ICD-10-CM | POA: Insufficient documentation

## 2019-09-29 DIAGNOSIS — Y999 Unspecified external cause status: Secondary | ICD-10-CM | POA: Insufficient documentation

## 2019-09-29 DIAGNOSIS — S0003XA Contusion of scalp, initial encounter: Secondary | ICD-10-CM | POA: Insufficient documentation

## 2019-09-29 DIAGNOSIS — Z79899 Other long term (current) drug therapy: Secondary | ICD-10-CM | POA: Insufficient documentation

## 2019-09-29 DIAGNOSIS — E876 Hypokalemia: Secondary | ICD-10-CM | POA: Insufficient documentation

## 2019-09-29 DIAGNOSIS — W19XXXA Unspecified fall, initial encounter: Secondary | ICD-10-CM

## 2019-09-29 DIAGNOSIS — Y9389 Activity, other specified: Secondary | ICD-10-CM | POA: Insufficient documentation

## 2019-09-29 DIAGNOSIS — R42 Dizziness and giddiness: Secondary | ICD-10-CM

## 2019-09-29 DIAGNOSIS — C7931 Secondary malignant neoplasm of brain: Secondary | ICD-10-CM

## 2019-09-29 DIAGNOSIS — Y907 Blood alcohol level of 200-239 mg/100 ml: Secondary | ICD-10-CM | POA: Insufficient documentation

## 2019-09-29 DIAGNOSIS — D496 Neoplasm of unspecified behavior of brain: Secondary | ICD-10-CM | POA: Insufficient documentation

## 2019-09-29 DIAGNOSIS — D381 Neoplasm of uncertain behavior of trachea, bronchus and lung: Secondary | ICD-10-CM | POA: Insufficient documentation

## 2019-09-29 DIAGNOSIS — F10929 Alcohol use, unspecified with intoxication, unspecified: Secondary | ICD-10-CM | POA: Insufficient documentation

## 2019-09-29 DIAGNOSIS — R918 Other nonspecific abnormal finding of lung field: Secondary | ICD-10-CM

## 2019-09-29 DIAGNOSIS — J449 Chronic obstructive pulmonary disease, unspecified: Secondary | ICD-10-CM | POA: Insufficient documentation

## 2019-09-29 DIAGNOSIS — Y92099 Unspecified place in other non-institutional residence as the place of occurrence of the external cause: Secondary | ICD-10-CM | POA: Insufficient documentation

## 2019-09-29 DIAGNOSIS — S0990XA Unspecified injury of head, initial encounter: Secondary | ICD-10-CM

## 2019-09-29 DIAGNOSIS — R590 Localized enlarged lymph nodes: Secondary | ICD-10-CM

## 2019-09-29 DIAGNOSIS — R110 Nausea: Secondary | ICD-10-CM

## 2019-09-29 DIAGNOSIS — I10 Essential (primary) hypertension: Secondary | ICD-10-CM | POA: Insufficient documentation

## 2019-09-29 LAB — CBG MONITORING, ED: Glucose-Capillary: 149 mg/dL — ABNORMAL HIGH (ref 70–99)

## 2019-09-29 LAB — CBC WITH DIFFERENTIAL/PLATELET
Abs Immature Granulocytes: 0.07 10*3/uL (ref 0.00–0.07)
Basophils Absolute: 0.1 10*3/uL (ref 0.0–0.1)
Basophils Relative: 1 %
Eosinophils Absolute: 0.8 10*3/uL — ABNORMAL HIGH (ref 0.0–0.5)
Eosinophils Relative: 7 %
HCT: 36.7 % (ref 36.0–46.0)
Hemoglobin: 12 g/dL (ref 12.0–15.0)
Immature Granulocytes: 1 %
Lymphocytes Relative: 28 %
Lymphs Abs: 2.9 10*3/uL (ref 0.7–4.0)
MCH: 30.9 pg (ref 26.0–34.0)
MCHC: 32.7 g/dL (ref 30.0–36.0)
MCV: 94.6 fL (ref 80.0–100.0)
Monocytes Absolute: 0.9 10*3/uL (ref 0.1–1.0)
Monocytes Relative: 9 %
Neutro Abs: 5.8 10*3/uL (ref 1.7–7.7)
Neutrophils Relative %: 54 %
Platelets: 392 10*3/uL (ref 150–400)
RBC: 3.88 MIL/uL (ref 3.87–5.11)
RDW: 11.9 % (ref 11.5–15.5)
WBC: 10.5 10*3/uL (ref 4.0–10.5)
nRBC: 0 % (ref 0.0–0.2)

## 2019-09-29 LAB — COMPREHENSIVE METABOLIC PANEL
ALT: 24 U/L (ref 0–44)
AST: 22 U/L (ref 15–41)
Albumin: 3.4 g/dL — ABNORMAL LOW (ref 3.5–5.0)
Alkaline Phosphatase: 88 U/L (ref 38–126)
Anion gap: 16 — ABNORMAL HIGH (ref 5–15)
BUN: 11 mg/dL (ref 8–23)
CO2: 22 mmol/L (ref 22–32)
Calcium: 9.3 mg/dL (ref 8.9–10.3)
Chloride: 103 mmol/L (ref 98–111)
Creatinine, Ser: 0.64 mg/dL (ref 0.44–1.00)
GFR calc Af Amer: >60 mL/min (ref >60)
GFR calc non Af Amer: >60 mL/min (ref >60)
Glucose, Bld: 163 mg/dL — ABNORMAL HIGH (ref 70–99)
Potassium: 3 mmol/L — ABNORMAL LOW (ref 3.5–5.1)
Sodium: 141 mmol/L (ref 135–145)
Total Bilirubin: 0.3 mg/dL (ref 0.3–1.2)
Total Protein: 6.6 g/dL (ref 6.5–8.1)

## 2019-09-29 LAB — ETHANOL: Alcohol, Ethyl (B): 217 mg/dL — ABNORMAL HIGH (ref <10)

## 2019-09-29 LAB — ACETAMINOPHEN LEVEL: Acetaminophen (Tylenol), Serum: <10 ug/mL — ABNORMAL LOW (ref 10–30)

## 2019-09-29 LAB — SALICYLATE LEVEL: Salicylate Lvl: <7 mg/dL — ABNORMAL LOW (ref 7.0–30.0)

## 2019-09-29 MED ORDER — ONDANSETRON HCL 4 MG PO TABS: 4.0000 mg | ORAL_TABLET | Freq: Three times a day (TID) | ORAL | 0 refills | Status: DC | PRN

## 2019-09-29 MED ORDER — POTASSIUM CHLORIDE CRYS ER 20 MEQ PO TBCR
40.0000 meq | EXTENDED_RELEASE_TABLET | Freq: Once | ORAL | Status: AC
  Administered 2019-09-29: 40 meq via ORAL
  Filled 2019-09-29: qty 2

## 2019-09-29 NOTE — Telephone Encounter (Addendum)
Juanda Crumble called back to say that he had called Dr Agustina Caroli office and that they had moved PET scan to Wednesday and that Dr Lamonte Sakai is out of office today. He is wandering if he should be concerned about patient being dizzy when she gets up, it is worse on right side than left. She has finally gone to sleep.  Returned call 5:10pm Spoke with husband. She has not really eaten or drank any fluids today. They got home from ED early this am. He says she took a hard fall on wooded landing and struck occipit. She had been drnking wine. She was given IVF and K supplement in ED for K of 3. Having some nausea. Will call in Zofran 4 mg tabs one po q 8 hours prn nausea. She likely has mild concussion but also CT raises question of metastatic disease from lung tumor ?small cell carcinoma?  I considered giving her Decadron this evening but will just have her take Zofran. Husband will try to get her to eat and drink and check on her q 2 hours to see if has worsening of symptoms. If so he will take her back to ED. Has PET scan scheduled for later in the week.

## 2019-09-29 NOTE — ED Notes (Signed)
Gave update to daughter and advised pt is ready for discharge. Daughter stated that she will come to pick her up

## 2019-09-29 NOTE — Telephone Encounter (Signed)
Spoke with pt's husband. States that she is having issues with dizziness. This is not something new, this has been an issue the pt has had. Advised him that if her dizziness gets worse between now and RB's return call tomorrow, he needs to take her back to the ED to be evaluated. He verbalized understanding.

## 2019-09-29 NOTE — ED Triage Notes (Signed)
Pt comes via [facility] from home after a fall, ETOH on board, fell down appx 6-8 stairs, pt was initially unresponsive, husband performed several compressions, pt then became responsive, hematoma to back of head, not on thinners.

## 2019-09-29 NOTE — Discharge Instructions (Addendum)
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  More workup is needed to confirm theses results.  Please contact the oncologist listed as well as your pulmonologist and your Primary Care Doctor.  You should NOT drive or operate heavy machinery until cleared by your doctor.  You are at increased risk of seizure and stroke due to metastases on your brain.  If your symptoms change or worsen, return to the ER.

## 2019-09-29 NOTE — ED Notes (Signed)
Mardelle Matte daughter [phone] looking for an update on the patient

## 2019-09-29 NOTE — Telephone Encounter (Signed)
Called spoke with patient's spouse Juanda Crumble who reported that patient fell yesterday carrying their dog upstairs - hit her head pretty hard and has a hematoma on her posterior scalp.  Went to the ED where a CT Head was done:  IMPRESSION: 1. No acute intracranial hemorrhage. 2. No acute cervical spine fracture. 3. Findings concerning for metastatic disease involving the left cerebellum and right occipital lobe as detailed above. Follow-up with a contrast-enhanced MRI is recommended. 4. Enlarged left supraclavicular lymph node.   Charles/patient have a few questions/concerns that he would like addressed ASAP.  Spouse and patient are VERY concerned about the above results and would like to know:  1. Can the PET be moved up?  This has been taken care of >> PET moved to Thursday 1/21 @ [facility] 2. Can the bronch be moved up (currently scheduled for 1/27)?  Patient and spouse are very anxious about finding out answers as soon as possible. 3. Patient is still having some dizziness from the fall, primarily when she turns to her right.  There was nausea initially but this has subsided.  Will this dizziness interfere with any anesthesia for the bronch or PET?  Juanda Crumble is aware that Dr Lamonte Sakai is not in the office today but will return tomorrow morning.  Due to the nature of the call, routing to Dr Lamonte Sakai marked urgent and will cc Ria Comment to ensure follow up.

## 2019-09-29 NOTE — ED Notes (Signed)
Pt verbalized understanding of d/c instructions, follow up care and s/s requiring return to ED. Also discussed the need to not drive until cleared by GP. Pt had no further questions a tthis time.

## 2019-09-29 NOTE — Telephone Encounter (Signed)
Charles spouse states patient is very dizzy.  [facility] phone number is [phone].

## 2019-09-29 NOTE — ED Notes (Signed)
Patient transported to X-ray 

## 2019-09-29 NOTE — Telephone Encounter (Signed)
As of now, patient is scheduled for PET on Friday 1.22.21 and the bronch is 1.27.21 as indicated at patient's consult with Dr Lamonte Sakai on 1.15.21.

## 2019-09-29 NOTE — ED Provider Notes (Addendum)
[facility] EMERGENCY DEPARTMENT Provider Note   CSN: [id number] Arrival date & time: 09/29/19  0049     History Chief Complaint  Patient presents with  . Fall    Virginia [name] is a 64 y.o. female.  Patient presents to the emergency department with a chief complaint of fall.  She is clinically intoxicated.  Per EMS, the patient had a fall at home.  Fell down approximately 6-8 stairs.  Was initially thought to be unresponsive, husband began chest compressions, patient woke up and stopped him.  Currently, patient denies any pain.  Level 5 caveat secondary to intoxication.  The history is provided by the patient. No language interpreter was used.       Past Medical History:  Diagnosis Date  . Anxiety   . GERD (gastroesophageal reflux disease)   . Hypercholesterolemia    per pt, she does not have elevated lipids  . Hypertension   . Tobacco abuse     Patient Active Problem List   Diagnosis Date Noted  . Right lower lobe lung mass 09/25/2019  . COPD (chronic obstructive pulmonary disease) ([facility]) 09/25/2019  . Impaired glucose tolerance 12/08/2017  . Vitamin D deficiency 12/08/2017  . Microscopic hematuria 04/01/2014  . Hypertension 01/11/2012  . Palpitations 01/11/2012  . Abnormal ECG 12/26/2011  . Hyperlipidemia 12/25/2011  . History of smoking 12/25/2011  . Anxiety 04/29/2011    Past Surgical History:  Procedure Laterality Date  . ABDOMINAL HYSTERECTOMY     partial/ left ovaries  . CHOLECYSTECTOMY    . DILATION AND CURETTAGE OF UTERUS    . LIPOMA EXCISION  2018   removed under left breast and right thigh.  . TUBAL LIGATION       OB History   No obstetric history on file.     Family History  Problem Relation Age of Onset  . Heart disease Father   . Drug abuse Daughter   . Drug abuse Son   . Cancer Sister   . Heart disease Brother   . Heart attack Brother     Social History   Tobacco Use  . Smoking status: Former Smoker     Packs/day: 0.25    Quit date: 10/31/2014    Years since quitting: 4.9  . Smokeless tobacco: Never Used  Substance Use Topics  . Alcohol use: Yes    Alcohol/week: 0.0 standard drinks    Comment: rare  . Drug use: No    Home Medications Prior to Admission medications   Medication Sig Start Date End Date Taking? Authorizing Provider  albuterol (VENTOLIN HFA) 108 (90 Base) MCG/ACT inhaler Inhale 2 puffs into the lungs every 6 (six) hours as needed for wheezing or shortness of breath. 09/07/19   Elby Showers, MD  ALPRAZolam Duanne Moron) 0.25 MG tablet TAKE 1 TABLET BY MOUTH TWICE DAILY AS NEEDED FOR ANXIETY 01/06/19   Elby Showers, MD  clarithromycin (BIAXIN) 500 MG tablet Take 1 tablet (500 mg total) by mouth 2 (two) times daily. 08/21/19   Elby Showers, MD  hydrochlorothiazide (HYDRODIURIL) 25 MG tablet TAKE 1 TABLET(25 MG) BY MOUTH DAILY 08/12/19   Baxley, Cresenciano Lick, MD  HYDROcodone-homatropine [facility]) 5-1.5 MG/5ML syrup Take 5 mLs by mouth every 8 (eight) hours as needed for cough. 09/08/19   Elby Showers, MD  ibuprofen (ADVIL,MOTRIN) 800 MG tablet take 1 tablet by mouth every 8 hours if needed for pain 05/22/18   Elby Showers, MD  levofloxacin [facility]) 500  MG tablet Take 1 tablet (500 mg total) by mouth daily. 09/08/19   Elby Showers, MD  losartan (COZAAR) 100 MG tablet TAKE 1 TABLET(100 MG) BY MOUTH DAILY 08/12/19   Elby Showers, MD  metFORMIN (GLUCOPHAGE) 500 MG tablet Take 1 tablet (500 mg total) by mouth 2 (two) times daily with a meal. 10/09/18   Baxley, Cresenciano Lick, MD  OVER THE COUNTER MEDICATION Apply 1 application topically daily as needed. [place] Gel OTC. As needed for shoulder pain.    [provider]  pantoprazole (PROTONIX) 40 MG tablet TAKE 1 TABLET(40 MG) BY MOUTH DAILY 05/27/19   Elby Showers, MD  psyllium (METAMUCIL) 58.6 % powder Take 1 packet by mouth daily.    [provider]  rosuvastatin (CRESTOR) 5 MG tablet TAKE 1 TABLET BY MOUTH THREE TIMES WEEKLY  AS DIRECTED 09/25/19   Elby Showers, MD  valACYclovir (VALTREX) 1000 MG tablet Take 1,000 mg by mouth as needed.  02/11/17   [provider]    Allergies    Patient has no known allergies.  Review of Systems   Review of Systems  Unable to perform ROS: Mental status change (intoxicated)    Physical Exam Updated Vital Signs BP (!) 148/102   Pulse (!) 110   Temp 98.5 F (36.9 C) (Oral)   Resp (!) 23   SpO2 96%   Physical Exam Vitals and nursing note reviewed.  Constitutional:      General: She is not in acute distress.    Appearance: She is well-developed.  HENT:     Head: Normocephalic and atraumatic.     Comments: Posterior scalp hematoma Eyes:     Conjunctiva/sclera: Conjunctivae normal.  Neck:     Comments: In cervical collar Cardiovascular:     Rate and Rhythm: Regular rhythm. Tachycardia present.     Heart sounds: No murmur.  Pulmonary:     Effort: Pulmonary effort is normal. No respiratory distress.     Breath sounds: Normal breath sounds.  Abdominal:     Palpations: Abdomen is soft.     Tenderness: There is no abdominal tenderness.  Musculoskeletal:     Cervical back: Neck supple.     Comments: Normal passive range of motion of upper and lower extremities, no deformities noted, no pain with palpation  Skin:    General: Skin is warm and dry.  Neurological:     Mental Status: She is alert and oriented to person, place, and time.     Comments: Alert and oriented, but intoxicated  Psychiatric:     Comments: Intoxicated     ED Results / Procedures / Treatments   Labs (all labs ordered are listed, but only abnormal results are displayed) Labs Reviewed - No data to display  EKG None  Radiology DG Chest 2 View  Result Date: 09/29/2019 CLINICAL DATA:  Screening. Fall and loss of consciousness. EXAM: CHEST - 2 VIEW COMPARISON:  Chest CT 09/24/2019 FINDINGS: Right infrahilar opacity corresponds to pulmonary mass on CT. Adjacent streaky opacities  likely postobstructive atelectasis. Left lung is clear. Normal heart size. No pneumothorax or pleural fluid. No acute osseous abnormalities are seen. There are no radiopaque foreign bodies or medical device in the chest. Multiple overlying EKG leads. IMPRESSION: Right infrahilar opacity corresponds to pulmonary mass on CT. Adjacent streaky opacities likely postobstructive atelectasis. No radiopaque foreign body or metallic device to preclude MRI imaging. Electronically Signed   By: Keith Rake M.D.   On: 09/29/2019 02:00  CT Head Wo Contrast  Result Date: 09/29/2019 CLINICAL DATA:  Neck trauma.  Pain status post fall. EXAM: CT HEAD WITHOUT CONTRAST CT CERVICAL SPINE WITHOUT CONTRAST TECHNIQUE: Multidetector CT imaging of the head and cervical spine was performed following the standard protocol without intravenous contrast. Multiplanar CT image reconstructions of the cervical spine were also generated. COMPARISON:  None. FINDINGS: CT HEAD FINDINGS Brain: There is extensive vasogenic edema involving the left cerebellum there is suggestion of an underlying mass measuring approximately 1.8 cm. There is vasogenic edema in the right occipital lobe. There may be some vasogenic edema within the right frontal parietal lobe. There is no midline shift. No acute intracranial hemorrhage. There is some artifact involving the low anterior frontal lobe, less likely acute subarachnoid hemorrhage. Vascular: No hyperdense vessel or unexpected calcification. Skull: Normal. Negative for fracture or focal lesion.There is right posterior scalp swelling without evidence for an underlying fracture. Sinuses/Orbits: There is mild mucosal thickening of the maxillary sinuses bilaterally. The remaining paranasal sinuses and mastoid air cells are essentially clear. Other: None. CT CERVICAL SPINE FINDINGS Alignment: There is straightening of the normal cervical lordotic curvature. Skull base and vertebrae: No acute fracture. No primary  bone lesion or focal pathologic process. Soft tissues and spinal canal: No prevertebral fluid or swelling. No visible canal hematoma. Disc levels: The disc heights are relatively well preserved throughout the cervical spine. Upper chest: Negative. Other: There is an enlarged left supraclavicular lymph node. Atherosclerotic changes are noted of the carotid arteries. IMPRESSION: 1. No acute intracranial hemorrhage. 2. No acute cervical spine fracture. 3. Findings concerning for metastatic disease involving the left cerebellum and right occipital lobe as detailed above. Follow-up with a contrast-enhanced MRI is recommended. 4. Enlarged left supraclavicular lymph node. Electronically Signed   By: Constance Holster M.D.   On: 09/29/2019 01:46   CT Cervical Spine Wo Contrast  Result Date: 09/29/2019 CLINICAL DATA:  Neck trauma.  Pain status post fall. EXAM: CT HEAD WITHOUT CONTRAST CT CERVICAL SPINE WITHOUT CONTRAST TECHNIQUE: Multidetector CT imaging of the head and cervical spine was performed following the standard protocol without intravenous contrast. Multiplanar CT image reconstructions of the cervical spine were also generated. COMPARISON:  None. FINDINGS: CT HEAD FINDINGS Brain: There is extensive vasogenic edema involving the left cerebellum there is suggestion of an underlying mass measuring approximately 1.8 cm. There is vasogenic edema in the right occipital lobe. There may be some vasogenic edema within the right frontal parietal lobe. There is no midline shift. No acute intracranial hemorrhage. There is some artifact involving the low anterior frontal lobe, less likely acute subarachnoid hemorrhage. Vascular: No hyperdense vessel or unexpected calcification. Skull: Normal. Negative for fracture or focal lesion.There is right posterior scalp swelling without evidence for an underlying fracture. Sinuses/Orbits: There is mild mucosal thickening of the maxillary sinuses bilaterally. The remaining paranasal  sinuses and mastoid air cells are essentially clear. Other: None. CT CERVICAL SPINE FINDINGS Alignment: There is straightening of the normal cervical lordotic curvature. Skull base and vertebrae: No acute fracture. No primary bone lesion or focal pathologic process. Soft tissues and spinal canal: No prevertebral fluid or swelling. No visible canal hematoma. Disc levels: The disc heights are relatively well preserved throughout the cervical spine. Upper chest: Negative. Other: There is an enlarged left supraclavicular lymph node. Atherosclerotic changes are noted of the carotid arteries. IMPRESSION: 1. No acute intracranial hemorrhage. 2. No acute cervical spine fracture. 3. Findings concerning for metastatic disease involving the left cerebellum and right occipital  lobe as detailed above. Follow-up with a contrast-enhanced MRI is recommended. 4. Enlarged left supraclavicular lymph node. Electronically Signed   By: Constance Holster M.D.   On: 09/29/2019 01:46    Procedures .Critical Care Performed by: Montine Circle, [name] Authorized by: Montine Circle, [name]   Critical care provider statement:    Critical care time (minutes):  33   Critical care was necessary to treat or prevent imminent or life-threatening deterioration of the following conditions: brain mets.   Critical care was time spent personally by me on the following activities:  Discussions with consultants, evaluation of patient's response to treatment, examination of patient, ordering and performing treatments and interventions, ordering and review of laboratory studies, ordering and review of radiographic studies, pulse oximetry, re-evaluation of patient's condition, obtaining history from patient or surrogate and review of old charts   (including critical care time)  Medications Ordered in ED Medications - No data to display  ED Course  I have reviewed the triage vital signs and the nursing notes.  Pertinent labs & imaging  results that were available during my care of the patient were reviewed by me and considered in my medical decision making (see chart for details).    MDM Rules/Calculators/A&P                      Patient here with fall downstairs tonight. She was drinking alcohol, and was intoxicated when this happened. Husband believed her to be unresponsive and began chest compressions, but the patient stopped him. She currently denies any pain. She does have an abrasion and hematoma to her posterior scalp. CT head and cervical spine are ordered. No other traumatic injuries on physical exam.  CT negative for acute intracranial or cervical process, but unfortunately, she does have findings consistent with metastatic disease on her CT head. She is currently being worked up by pulmonology for a lung mass, thought to be primary cancer.  I discussed these results with the patient. She will follow-up with her pulmonologist as well as with oncology. I have messaged both Dr. Lamonte Sakai and Dr. Lorenso Courier in epic.     Final Clinical Impression(s) / ED Diagnoses Final diagnoses:  Fall, initial encounter  Injury of head, initial encounter  Alcoholic intoxication with complication [facility])  Brain metastases [facility])    Rx / [place] Orders ED Discharge Orders    None       Montine Circle, [name] 09/29/19 [place], [name] 09/29/19 1275    Fatima Blank, MD 09/29/19 [phone]

## 2019-09-30 ENCOUNTER — Other Ambulatory Visit: Payer: Self-pay | Admitting: *Deleted

## 2019-09-30 ENCOUNTER — Ambulatory Visit (HOSPITAL_COMMUNITY)
Admission: RE | Admit: 2019-09-30 | Discharge: 2019-09-30 | Disposition: A | Discharge status: Home / Self Care | Payer: Self-pay | Source: Ambulatory Visit | Attending: Emergency Medicine | Admitting: Emergency Medicine

## 2019-09-30 ENCOUNTER — Other Ambulatory Visit: Payer: Self-pay

## 2019-09-30 DIAGNOSIS — C799 Secondary malignant neoplasm of unspecified site: Secondary | ICD-10-CM

## 2019-09-30 MED ORDER — GADOBUTROL 1 MMOL/ML IV SOLN
7.5000 mL | Freq: Once | INTRAVENOUS | Status: AC | PRN
  Administered 2019-09-30: 7.5 mL via INTRAVENOUS

## 2019-09-30 MED ORDER — DEXAMETHASONE 2 MG PO TABS: 2.0000 mg | ORAL_TABLET | Freq: Two times a day (BID) | ORAL | 2 refills | Status: DC

## 2019-09-30 NOTE — Telephone Encounter (Signed)
I reviewed her MRI Brain from today. She has multiple mets, the predominant being the cerebellar lesion with surrounding edema. Based on her imbalance, dizziness and the edema on scan, I will start her on low dose dexamethasone until we can move towards definitive therapy.

## 2019-09-30 NOTE — Telephone Encounter (Signed)
I spoke with Juanda Crumble, reviewed the CT scan of the head with him, unfortunately shows evidence for brain metastases including a cerebellar met.  They are planning for PET scan tomorrow at [facility].  I will order an MRI brain to be done whenever we can get the most quickly.  I will review the PET scan when available.  If an alternative target for biopsy presents itself then it may be reasonable to change the biopsy strategy, defer the bronchoscopy/EBUS that we have planned for 1/27.  I will coordinate with interventional radiology if this looks like the best plan.

## 2019-10-01 ENCOUNTER — Other Ambulatory Visit: Payer: Self-pay

## 2019-10-01 ENCOUNTER — Ambulatory Visit
Admission: RE | Admit: 2019-10-01 | Discharge: 2019-10-01 | Disposition: A | Discharge status: Home / Self Care | Payer: Self-pay | Source: Ambulatory Visit | Attending: Emergency Medicine | Admitting: Emergency Medicine

## 2019-10-01 DIAGNOSIS — R918 Other nonspecific abnormal finding of lung field: Secondary | ICD-10-CM | POA: Insufficient documentation

## 2019-10-01 DIAGNOSIS — I700 Atherosclerosis of aorta: Secondary | ICD-10-CM | POA: Insufficient documentation

## 2019-10-01 LAB — GLUCOSE, CAPILLARY: Glucose-Capillary: 125 mg/dL — ABNORMAL HIGH (ref 70–99)

## 2019-10-01 MED ORDER — FLUDEOXYGLUCOSE F - 18 (FDG) INJECTION
8.1400 | Freq: Once | INTRAVENOUS | Status: AC | PRN
  Administered 2019-10-01: 8.14 via INTRAVENOUS

## 2019-10-02 ENCOUNTER — Encounter (HOSPITAL_COMMUNITY): Payer: Self-pay

## 2019-10-02 ENCOUNTER — Encounter (HOSPITAL_COMMUNITY): Payer: Self-pay | Admitting: Radiology

## 2019-10-02 NOTE — Progress Notes (Signed)
Virginia [name] Female, 64 y.o., September 21, 1955 MRN:  [id number] Phone:  [phone] [name]) PCP:  Elby Showers, MD Primary Cvg:  None Next Appt With [facility]-SCREENING 10/05/2019 at 8:30 AM  RE: STAT:  US Guided Needle Placement Received: Today Message Contents  Markus Daft, MD  Arlyn Leak for US guided core biopsy left cervical lymph node.\   [place]       Previous Messages   ----- Message -----  From: Garth Bigness D  Sent: 10/02/2019 11:26 AM EST  To: Markus Daft, MD  Subject: STAT:  US Guided Needle Placement        Procedure:  US Guided Needle Placement   Reason: Right lower lobe lung mass, Cervical lymphadenopathy, Probable metastatic lung cancer, left cervical lymphadenopathy    Discussed the case with Dr. Anselm Pancoast on 10/02/2019, agree that the left cervical node is best target for ultrasound-guided biopsy. Discussed with patient and family on 1/22, aware that referral is being made    History: CT, MR, NM PET in computer   Provider: Collene Gobble   Provider Contact: [phone]

## 2019-10-02 NOTE — Telephone Encounter (Signed)
Review PET scan with the patient, husband, daughter by phone.  Also with Dr. Anselm Pancoast in interventional radiology.  She has left cervical, left axillary and right supraclavicular adenopathy.  The left cervical node is likely the best target for interventional radiology, guided needle biopsy.  I think this is the best strategy to get a definitive diagnosis, should be safer than bronchoscopy under general anesthesia.  Patient and family understand the rationale and the plan.  I will place a referral to interventional radiology, attention to Dr. Anselm Pancoast so that we can get the procedure scheduled soon as possible.

## 2019-10-02 NOTE — Telephone Encounter (Signed)
Referral made to Dr. Anselm Pancoast in interventional radiology

## 2019-10-05 ENCOUNTER — Telehealth: Payer: Self-pay | Admitting: Hematology and Oncology

## 2019-10-05 ENCOUNTER — Inpatient Hospital Stay (HOSPITAL_COMMUNITY): Admission: RE | Admit: 2019-10-05 | Payer: Self-pay | Source: Ambulatory Visit

## 2019-10-05 NOTE — Telephone Encounter (Signed)
A new pt appt hs been scheduled for Virginia [name] to see Dr. Lorenso Courier on 2/1 at 1pm. Appt date and time has been given to the pt's husband who's aware to arrive 15 minutes early.

## 2019-10-06 ENCOUNTER — Telehealth: Payer: Self-pay | Admitting: Internal Medicine

## 2019-10-06 ENCOUNTER — Telehealth: Payer: Self-pay | Admitting: Emergency Medicine

## 2019-10-06 ENCOUNTER — Other Ambulatory Visit: Payer: Self-pay

## 2019-10-06 MED ORDER — ALBUTEROL SULFATE HFA 108 (90 BASE) MCG/ACT IN AERS: 2.0000 | INHALATION_SPRAY | Freq: Four times a day (QID) | RESPIRATORY_TRACT | 0 refills | Status: DC | PRN

## 2019-10-06 NOTE — Telephone Encounter (Signed)
Spoke with Juanda Crumble, he wanted to know if RB wanted to schedule a bronch just in case it is needed. He stated that RB advised them to go ahead with the biopsy which is scheduled for Thursday at 1pm. He doesn't want to wait if a bronch is still needed after the procedure. RB please advise if we should go ahead and schedule the bronch as well.

## 2019-10-06 NOTE — Telephone Encounter (Signed)
She doesn't need the bronchoscopy, she needs the node biopsy that is arranged for 1/28.

## 2019-10-06 NOTE — Telephone Encounter (Signed)
Call in albuterol inhaler may use 2 sprays up to 4 times a day as needed but not at same time as [employer]

## 2019-10-06 NOTE — Telephone Encounter (Signed)
Virginia [name] was notified, rx was sent in to the pharmacy.

## 2019-10-06 NOTE — Telephone Encounter (Signed)
Virginia [name] [phone]  [name] called to see if he could get an albuterol inhaler for [name], she is wheezing. He stated she was wanting to use the Breztri samples more than 2 puffs twice a day, he had called pulmonoghy and they told him she defiantly could not do more than 2 puffs twice a day, but told him she might could use rescue inhaler, and they stated one had been prescribed for her. So he is not sure if pharmacy still has RX and he wanted to know if she could use the albuterol and the Breztri at same time.

## 2019-10-07 ENCOUNTER — Ambulatory Visit (HOSPITAL_COMMUNITY): Admission: RE | Admit: 2019-10-07 | Payer: Self-pay | Source: Home / Self Care | Admitting: Emergency Medicine

## 2019-10-07 ENCOUNTER — Encounter (HOSPITAL_COMMUNITY): Admission: RE | Payer: Self-pay | Source: Home / Self Care

## 2019-10-07 ENCOUNTER — Other Ambulatory Visit: Payer: Self-pay | Admitting: Radiology

## 2019-10-07 SURGERY — BRONCHOSCOPY, WITH EBUS
Anesthesia: General

## 2019-10-07 NOTE — Telephone Encounter (Signed)
Spoke with Patient and Husband. Let them know Dr. Sudie Bailey recommendations.  No further questions at this time.  Nothing further needed at this time.

## 2019-10-08 ENCOUNTER — Encounter (HOSPITAL_COMMUNITY): Payer: Self-pay

## 2019-10-08 ENCOUNTER — Other Ambulatory Visit: Payer: Self-pay

## 2019-10-08 ENCOUNTER — Ambulatory Visit (HOSPITAL_COMMUNITY)
Admission: RE | Admit: 2019-10-08 | Discharge: 2019-10-08 | Disposition: A | Discharge status: Home / Self Care | Payer: Self-pay | Source: Ambulatory Visit | Attending: Emergency Medicine | Admitting: Emergency Medicine

## 2019-10-08 DIAGNOSIS — Z79899 Other long term (current) drug therapy: Secondary | ICD-10-CM | POA: Insufficient documentation

## 2019-10-08 DIAGNOSIS — F419 Anxiety disorder, unspecified: Secondary | ICD-10-CM | POA: Insufficient documentation

## 2019-10-08 DIAGNOSIS — Z87891 Personal history of nicotine dependence: Secondary | ICD-10-CM | POA: Insufficient documentation

## 2019-10-08 DIAGNOSIS — C770 Secondary and unspecified malignant neoplasm of lymph nodes of head, face and neck: Secondary | ICD-10-CM | POA: Insufficient documentation

## 2019-10-08 DIAGNOSIS — R918 Other nonspecific abnormal finding of lung field: Secondary | ICD-10-CM | POA: Insufficient documentation

## 2019-10-08 DIAGNOSIS — C7931 Secondary malignant neoplasm of brain: Secondary | ICD-10-CM | POA: Insufficient documentation

## 2019-10-08 DIAGNOSIS — K219 Gastro-esophageal reflux disease without esophagitis: Secondary | ICD-10-CM | POA: Insufficient documentation

## 2019-10-08 DIAGNOSIS — E7800 Pure hypercholesterolemia, unspecified: Secondary | ICD-10-CM | POA: Insufficient documentation

## 2019-10-08 DIAGNOSIS — R590 Localized enlarged lymph nodes: Secondary | ICD-10-CM

## 2019-10-08 DIAGNOSIS — C799 Secondary malignant neoplasm of unspecified site: Secondary | ICD-10-CM | POA: Insufficient documentation

## 2019-10-08 DIAGNOSIS — Z8249 Family history of ischemic heart disease and other diseases of the circulatory system: Secondary | ICD-10-CM | POA: Insufficient documentation

## 2019-10-08 DIAGNOSIS — I10 Essential (primary) hypertension: Secondary | ICD-10-CM | POA: Insufficient documentation

## 2019-10-08 DIAGNOSIS — Z7951 Long term (current) use of inhaled steroids: Secondary | ICD-10-CM | POA: Insufficient documentation

## 2019-10-08 MED ORDER — SODIUM CHLORIDE 0.9 % IV SOLN: INTRAVENOUS | Status: DC

## 2019-10-08 MED ORDER — MIDAZOLAM HCL 2 MG/2ML IJ SOLN
INTRAMUSCULAR | Status: AC
  Filled 2019-10-08: qty 2

## 2019-10-08 MED ORDER — LIDOCAINE HCL (PF) 1 % IJ SOLN
INTRAMUSCULAR | Status: AC
  Filled 2019-10-08: qty 30

## 2019-10-08 MED ORDER — FENTANYL CITRATE (PF) 100 MCG/2ML IJ SOLN
INTRAMUSCULAR | Status: AC
  Filled 2019-10-08: qty 2

## 2019-10-08 NOTE — Progress Notes (Signed)
Reviewed discharged instructions with pt. Pt verbalized understanding. She did not receive sedation and is alert and oriented. Vitals are stable. Discharged home with husband.

## 2019-10-08 NOTE — Sedation Documentation (Signed)
Pt is in [name] room 2 at this time. Pt does not have any complaints. Pt is NPO, consent signed, alert and oriented x4.

## 2019-10-08 NOTE — Sedation Documentation (Signed)
Pt is resting. Vitals stable, Pt being prepped at this time. No sedation given

## 2019-10-08 NOTE — Sedation Documentation (Signed)
Upon Dr Moises Blood arrival to [name] rm 2 pt states that she fell on last Monday evening. She states that she doesn't remember any of it and that her daughter had to perform CPR. Pt states that she went to the ED and they released her. She states that she had a concussion and she still has bouts of dizziness since the event.

## 2019-10-08 NOTE — Sedation Documentation (Signed)
Patient is resting comfortably. 

## 2019-10-08 NOTE — Sedation Documentation (Signed)
Pt remains stable. Biopsies taken at this time. Denies pain.

## 2019-10-08 NOTE — H&P (Signed)
Chief Complaint: Patient was seen in consultation today for left cervical lymph node biopsy at the request of Byrum,Robert S  Referring Physician(s): Byrum,Robert S  Supervising Physician: Markus Daft  Patient Status: [facility] - Out-pt  History of Present Illness: Virginia [name] is a 64 y.o. female   Pt with Hx cough and congestion; hemoptysis X 1 mo PMD Dr Renold Genta ordered CXR which revealed 5.7 cm  right  lung mass; mediastinal LAN.  Further work up included MRI Brain: IMPRESSION: There are least 8 metastatic lesions within the supratentorial and infratentorial brain as outlined. An index peripherally enhancing mass within the left cerebellum measures 1.6 x 1.3 x 1.5 cm. Moderate surrounding edema. Resultant posterior fossa mass effect with partial effacement of the fourth ventricle. No evidence of hydrocephalus. 10 mm focus of enhancement within the left parietal calvarium with adjacent focal dural thickening and enhancement. 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 Markedly hypermetabolic infrahilar right lower lobe mass with hypermetabolic nodal metastases in the right hilum, mediastinum, right supraclavicular region, left axilla and lower left neck. 2. Hypermetabolic rib, vertebral body, and iliac bone lesions consistent with metastatic involvement. 3. Tiny hypermetabolic subcutaneous nodules also concerning for metastatic disease. 4.  Aortic Atherosclerois (ICD10-170.0)  Referred to Dr Lamonte Sakai He discussed with Dr Anselm Pancoast Now scheduled for left cervical LN bx today   Past Medical History:  Diagnosis Date  . Anxiety   . GERD (gastroesophageal reflux disease)   . Hypercholesterolemia    per pt, she does not have elevated lipids  . Hypertension   . Tobacco abuse     Past Surgical History:  Procedure Laterality Date  . ABDOMINAL HYSTERECTOMY     partial/ left ovaries  . CHOLECYSTECTOMY    . DILATION AND  CURETTAGE OF UTERUS    . LIPOMA EXCISION  2018   removed under left breast and right thigh.  . TUBAL LIGATION      Allergies: Patient has no known allergies.  Medications: Prior to Admission medications   Medication Sig Start Date End Date Taking? Authorizing Provider  acetaminophen (TYLENOL) 500 MG tablet Take 500-1,000 mg by mouth every 6 (six) hours as needed (for pain.).   Yes [provider]  albuterol (VENTOLIN HFA) 108 (90 Base) MCG/ACT inhaler Inhale 2 puffs into the lungs every 6 (six) hours as needed for wheezing or shortness of breath. 10/06/19  Yes Baxley, Cresenciano Lick, MD  APPLE CIDER VINEGAR PO Take 450 mg by mouth daily.   Yes [provider]  aspirin 325 MG EC tablet Take 325 mg by mouth every 8 (eight) hours as needed for pain.   Yes [provider]  Budeson-Glycopyrrol-Formoterol (BREZTRI AEROSPHERE) 160-9-4.8 MCG/ACT AERO Inhale 2 puffs into the lungs 2 (two) times daily.   Yes [provider]  Carboxymethylcellul-Glycerin (CLEAR EYES FOR DRY EYES) 1-0.25 % SOLN Place 1 drop into both eyes 3 (three) times daily as needed.   Yes [provider]  dexamethasone (DECADRON) 2 MG tablet Take 1 tablet (2 mg total) by mouth 2 (two) times daily. 09/30/19  Yes Collene Gobble, MD  ELDERBERRY PO Take 1,250 mg by mouth daily.   Yes [provider]  hydrochlorothiazide (HYDRODIURIL) 25 MG tablet TAKE 1 TABLET(25 MG) BY MOUTH DAILY Patient taking differently: Take 25 mg by mouth daily.  08/12/19  Yes Baxley, Cresenciano Lick, MD  ibuprofen (ADVIL,MOTRIN) 800 MG tablet take 1 tablet by mouth every 8 hours if needed for  pain Patient taking differently: Take 800 mg by mouth every 8 (eight) hours as needed (pain.).  05/22/18  Yes Baxley, Cresenciano Lick, MD  L-Lysine 500 MG TABS Take 500 mg by mouth 2 (two) times daily as needed (cold sores/fever blisters.).   Yes [provider]  losartan (COZAAR) 100 MG tablet TAKE 1 TABLET(100 MG) BY MOUTH DAILY Patient  taking differently: Take 100 mg by mouth daily. TAKE 1 TABLET(100 MG) BY MOUTH DAILY 08/12/19  Yes Baxley, Cresenciano Lick, MD  pantoprazole (PROTONIX) 40 MG tablet TAKE 1 TABLET(40 MG) BY MOUTH DAILY 05/27/19  Yes Baxley, Cresenciano Lick, MD  Probiotic Product (EQL PROBIOTIC COLON SUPPORT) CAPS Take 1 capsule by mouth daily.   Yes [provider]  psyllium (METAMUCIL) 58.6 % powder Take 1 packet by mouth daily.   Yes [provider]  rosuvastatin (CRESTOR) 5 MG tablet TAKE 1 TABLET BY MOUTH THREE TIMES WEEKLY AS DIRECTED Patient taking differently: Take 5 mg by mouth every Monday, Wednesday, and Friday. TAKE 1 TABLET BY MOUTH THREE TIMES WEEKLY AS DIRECTED 09/25/19  Yes Baxley, Cresenciano Lick, MD  Vitamin D3 (VITAMIN D) 25 MCG tablet Take 2,000 Units by mouth daily.   Yes [provider]  ALPRAZolam (XANAX) 0.25 MG tablet TAKE 1 TABLET BY MOUTH TWICE DAILY AS NEEDED FOR ANXIETY Patient taking differently: Take 0.25 mg by mouth 2 (two) times daily as needed for anxiety.  01/06/19   Elby Showers, MD  clarithromycin (BIAXIN) 500 MG tablet Take 1 tablet (500 mg total) by mouth 2 (two) times daily. Patient not taking: Reported on 09/29/2019 08/21/19   Elby Showers, MD  HYDROcodone-homatropine [facility]) 5-1.5 MG/5ML syrup Take 5 mLs by mouth every 8 (eight) hours as needed for cough. Patient not taking: Reported on 09/29/2019 09/08/19   Elby Showers, MD  levofloxacin (LEVAQUIN) 500 MG tablet Take 1 tablet (500 mg total) by mouth daily. Patient not taking: Reported on 09/29/2019 09/08/19   Elby Showers, MD  ondansetron (ZOFRAN) 4 MG tablet Take 1 tablet (4 mg total) by mouth every 8 (eight) hours as needed for nausea or vomiting. 09/29/19   Elby Showers, MD  OVER THE COUNTER MEDICATION Apply 1 application topically daily as needed (shoulder pain.). [place] Gel OTC    [provider]     Family History  Problem Relation Age of Onset  . Heart disease Father   . Drug abuse Daughter   . Drug  abuse Son   . Cancer Sister   . Heart disease Brother   . Heart attack Brother     Social History   Socioeconomic History  . Marital status: Married    Spouse name: Not on file  . Number of children: 3  . Years of education: Not on file  . Highest education level: Not on file  Occupational History  . Occupation: owns Medical illustrator: [employer]  Tobacco Use  . Smoking status: Former Smoker    Packs/day: 0.25    Quit date: 10/31/2014    Years since quitting: 4.9  . Smokeless tobacco: Never Used  Substance and Sexual Activity  . Alcohol use: Yes    Alcohol/week: 0.0 standard drinks    Comment: rare  . Drug use: No  . Sexual activity: Yes  Other Topics Concern  . Not on file  Social History Narrative  . Not on file   Social Determinants of Health   Financial Resource Strain:   .  Difficulty of Paying Living Expenses: Not on file  Food Insecurity:   . Worried About Charity fundraiser in the Last Year: Not on file  . Ran Out of Food in the Last Year: Not on file  Transportation Needs:   . Lack of Transportation (Medical): Not on file  . Lack of Transportation (Non-Medical): Not on file  Physical Activity:   . Days of Exercise per Week: Not on file  . Minutes of Exercise per Session: Not on file  Stress:   . Feeling of Stress : Not on file  Social Connections:   . Frequency of Communication with Friends and Family: Not on file  . Frequency of Social Gatherings with Friends and Family: Not on file  . Attends Religious Services: Not on file  . Active Member of Clubs or Organizations: Not on file  . Attends Archivist Meetings: Not on file  . Marital Status: Not on file    Review of Systems: A 12 point ROS discussed and pertinent positives are indicated in the HPI above.  All other systems are negative.  Review of Systems  Constitutional: Positive for activity change and fatigue. Negative for fever.  Respiratory: Positive for cough and  shortness of breath.   Cardiovascular: Negative for chest pain.  Gastrointestinal: Negative for nausea.  Musculoskeletal: Positive for gait problem.  Neurological: Positive for dizziness, weakness and light-headedness.  Psychiatric/Behavioral: Negative for behavioral problems and confusion.    Vital Signs: BP 126/86   Pulse 73   Temp 98.3 F (36.8 C) (Oral)   Resp 14   Ht 5\' 2"  (1.575 m)   Wt 144 lb (65.3 kg)   SpO2 (!) 63%   BMI 26.34 kg/m   Physical Exam Vitals reviewed.  Cardiovascular:     Rate and Rhythm: Regular rhythm.     Heart sounds: Normal heart sounds.  Pulmonary:     Effort: Pulmonary effort is normal.     Breath sounds: Normal breath sounds.  Abdominal:     Palpations: Abdomen is soft.  Musculoskeletal:        General: Normal range of motion.  Skin:    General: Skin is warm and dry.  Neurological:     Mental Status: She is alert and oriented to person, place, and time.  Psychiatric:        Behavior: Behavior normal.        Thought Content: Thought content normal.        Judgment: Judgment normal.     Imaging: DG Chest 2 View  Result Date: 09/29/2019 CLINICAL DATA:  Screening. Fall and loss of consciousness. EXAM: CHEST - 2 VIEW COMPARISON:  Chest CT 09/24/2019 FINDINGS: Right infrahilar opacity corresponds to pulmonary mass on CT. Adjacent streaky opacities likely postobstructive atelectasis. Left lung is clear. Normal heart size. No pneumothorax or pleural fluid. No acute osseous abnormalities are seen. There are no radiopaque foreign bodies or medical device in the chest. Multiple overlying EKG leads. IMPRESSION: Right infrahilar opacity corresponds to pulmonary mass on CT. Adjacent streaky opacities likely postobstructive atelectasis. No radiopaque foreign body or metallic device to preclude MRI imaging. Electronically Signed   By: Keith Rake M.D.   On: 09/29/2019 02:00   CT Head Wo Contrast  Result Date: 09/29/2019 CLINICAL DATA:  Neck trauma.   Pain status post fall. EXAM: CT HEAD WITHOUT CONTRAST CT CERVICAL SPINE WITHOUT CONTRAST TECHNIQUE: Multidetector CT imaging of the head and cervical spine was performed following the standard protocol without intravenous  contrast. Multiplanar CT image reconstructions of the cervical spine were also generated. COMPARISON:  None. FINDINGS: CT HEAD FINDINGS Brain: There is extensive vasogenic edema involving the left cerebellum there is suggestion of an underlying mass measuring approximately 1.8 cm. There is vasogenic edema in the right occipital lobe. There may be some vasogenic edema within the right frontal parietal lobe. There is no midline shift. No acute intracranial hemorrhage. There is some artifact involving the low anterior frontal lobe, less likely acute subarachnoid hemorrhage. Vascular: No hyperdense vessel or unexpected calcification. Skull: Normal. Negative for fracture or focal lesion.There is right posterior scalp swelling without evidence for an underlying fracture. Sinuses/Orbits: There is mild mucosal thickening of the maxillary sinuses bilaterally. The remaining paranasal sinuses and mastoid air cells are essentially clear. Other: None. CT CERVICAL SPINE FINDINGS Alignment: There is straightening of the normal cervical lordotic curvature. Skull base and vertebrae: No acute fracture. No primary bone lesion or focal pathologic process. Soft tissues and spinal canal: No prevertebral fluid or swelling. No visible canal hematoma. Disc levels: The disc heights are relatively well preserved throughout the cervical spine. Upper chest: Negative. Other: There is an enlarged left supraclavicular lymph node. Atherosclerotic changes are noted of the carotid arteries. IMPRESSION: 1. No acute intracranial hemorrhage. 2. No acute cervical spine fracture. 3. Findings concerning for metastatic disease involving the left cerebellum and right occipital lobe as detailed above. Follow-up with a contrast-enhanced MRI  is recommended. 4. Enlarged left supraclavicular lymph node. Electronically Signed   By: Constance Holster M.D.   On: 09/29/2019 01:46   CT Chest W Contrast  Result Date: 09/24/2019 CLINICAL DATA:  Persistent cough. Hemoptysis. Abnormal chest radiograph with right perihilar opacity. Former smoker per epic. EXAM: CT CHEST WITH CONTRAST TECHNIQUE: Multidetector CT imaging of the chest was performed during intravenous contrast administration. CONTRAST:  65mL OMNIPAQUE IOHEXOL 300 MG/ML  SOLN COMPARISON:  09/08/2019 chest radiograph. FINDINGS: Cardiovascular: Normal heart size. No significant pericardial effusion/thickening. Left anterior descending coronary atherosclerosis. Atherosclerotic nonaneurysmal thoracic aorta. Normal caliber pulmonary arteries. No central pulmonary emboli. Mediastinum/Nodes: No discrete thyroid nodules. Unremarkable esophagus. Enlarged 1.1 cm right prevascular node (series 3/image 61). Right paratracheal adenopathy up to 2.2 cm (series 3/image 72). Enlarged 2.5 cm subcarinal node (series 3/image 90). Enlarged 1.5 cm left paratracheal node (series 3/image 73). Right hilar adenopathy up to 1.4 cm (series 3/image 98). No pathologically enlarged left hilar nodes. No axillary adenopathy. Lungs/Pleura: No pneumothorax. Trace dependent right pleural effusion. No left pleural effusion. Poorly marginated cavitary 5.7 x 4.3 cm central right lower lobe lung mass (series 5/image 94) abutting and distorting the right major fissure, associated with high-grade stenosis of the central right lower lobe airways. Patchy consolidation and ground-glass opacity in the right lower lobe peripheral to the central lung mass compatible with postobstructive pneumonia. A few scattered small solid pulmonary nodules in both lungs, largest 6 mm in the peripheral right middle lobe (series 5/image 97) and 3 mm in the left lower lobe (series 5/image 92). Upper abdomen: Cholecystectomy. Musculoskeletal: No aggressive  appearing focal osseous lesions. Mild thoracic spondylosis. IMPRESSION: 1. Poorly marginated cavitary 5.7 cm central right lower lobe lung mass associated with high-grade stenosis of the central right lower lobe airways, suspicious for primary bronchogenic carcinoma. 2. Postobstructive pneumonia in the peripheral right lower lobe. 3. Right hilar, subcarinal, bilateral paratracheal and right prevascular mediastinal lymphadenopathy, suspicious for metastatic disease. 4. Several subcentimeter solid pulmonary nodules in both lungs, cannot exclude pulmonary metastases. 5. Trace dependent right pleural effusion.  6. Suggest staging PET-CT and multi disciplinary thoracic oncology consultation. Aortic Atherosclerosis (ICD10-I70.0). These results will be called to the ordering clinician or representative by the Radiology Department at the imaging location. Electronically Signed   By: Ilona Sorrel M.D.   On: 09/24/2019 12:50   CT Cervical Spine Wo Contrast  Result Date: 09/29/2019 CLINICAL DATA:  Neck trauma.  Pain status post fall. EXAM: CT HEAD WITHOUT CONTRAST CT CERVICAL SPINE WITHOUT CONTRAST TECHNIQUE: Multidetector CT imaging of the head and cervical spine was performed following the standard protocol without intravenous contrast. Multiplanar CT image reconstructions of the cervical spine were also generated. COMPARISON:  None. FINDINGS: CT HEAD FINDINGS Brain: There is extensive vasogenic edema involving the left cerebellum there is suggestion of an underlying mass measuring approximately 1.8 cm. There is vasogenic edema in the right occipital lobe. There may be some vasogenic edema within the right frontal parietal lobe. There is no midline shift. No acute intracranial hemorrhage. There is some artifact involving the low anterior frontal lobe, less likely acute subarachnoid hemorrhage. Vascular: No hyperdense vessel or unexpected calcification. Skull: Normal. Negative for fracture or focal lesion.There is right  posterior scalp swelling without evidence for an underlying fracture. Sinuses/Orbits: There is mild mucosal thickening of the maxillary sinuses bilaterally. The remaining paranasal sinuses and mastoid air cells are essentially clear. Other: None. CT CERVICAL SPINE FINDINGS Alignment: There is straightening of the normal cervical lordotic curvature. Skull base and vertebrae: No acute fracture. No primary bone lesion or focal pathologic process. Soft tissues and spinal canal: No prevertebral fluid or swelling. No visible canal hematoma. Disc levels: The disc heights are relatively well preserved throughout the cervical spine. Upper chest: Negative. Other: There is an enlarged left supraclavicular lymph node. Atherosclerotic changes are noted of the carotid arteries. IMPRESSION: 1. No acute intracranial hemorrhage. 2. No acute cervical spine fracture. 3. Findings concerning for metastatic disease involving the left cerebellum and right occipital lobe as detailed above. Follow-up with a contrast-enhanced MRI is recommended. 4. Enlarged left supraclavicular lymph node. Electronically Signed   By: Constance Holster M.D.   On: 09/29/2019 01:46   MR BRAIN W WO CONTRAST  Result Date: 09/30/2019 CLINICAL DATA:  Metastatic malignant neoplasm, unspecified site. Additional history provided: Patient with history of lung cancer EXAM: MRI HEAD WITHOUT AND WITH CONTRAST TECHNIQUE: Multiplanar, multiecho pulse sequences of the brain and surrounding structures were obtained without and with intravenous contrast. CONTRAST:  7.28mL GADAVIST GADOBUTROL 1 MMOL/ML IV SOLN COMPARISON:  Head CT 09/29/2019 FINDINGS: Brain: There are multiple enhancing intracranial metastases as follows. An index peripherally enhancing metastasis within the paramedian left cerebellum measures 1.6 x 1.3 x 1.5 cm (series 12, image 37). Moderate surrounding edema. 4 mm lesion within the right cerebellum (series 12, image 37). Mild surrounding edema. Punctate  lesion within the midline inferior cerebellum (series 12, image 34). 5 mm peripherally enhancing lesion within the right occipital lobe (series 12, image 61). Mild surrounding edema. 2 mm lesion within the right parietal lobe (series 12, image 86). Minimal associated edema. 3 mm peripherally enhancing lesion within right parietal lobe (series 12, image 101). Minimal associated edema. Punctate lesion within the left precentral gyrus (series 12, image 118). Mild surrounding edema. 3 mm lesion within the anterior left frontal lobe (series 12, image 121). An additional 2 mm peripherally enhancing metastasis is questioned within the quadrigeminal plate cistern along the internal cerebral veins (series 12, image 75), although this is not definite. Posterior fossa mass effect related to the  moderate edema associated with the dominant left cerebellar metastasis. Partial effacement of the fourth ventricle. No evidence of hydrocephalus on the current examination. No evidence of acute infarct. No supratentorial midline shift. No extra-axial fluid collection. No chronic intracranial blood products. Additional mild scattered T2/FLAIR hyperintensity within the cerebral white matter is nonspecific, but consistent with chronic small vessel ischemic disease. Cerebral volume is normal for age. Vascular: Flow voids maintained within the proximal large arterial vessels. 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 3 mm enhancing focus within right occipital calvarium which appears to demonstrate precontrast T1 hyperintensity and is likely benign. Sinuses/Orbits: Visualized orbits demonstrate no acute abnormality. Left maxillary sinus mucous retention cyst. Minimal ethmoid sinus mucosal thickening. No significant mastoid effusion. These results will be  called to the ordering clinician or representative by the Radiologist Assistant, and communication documented in the PACS or zVision Dashboard. IMPRESSION: There are least 8 metastatic lesions within the supratentorial and infratentorial brain as outlined. An index peripherally enhancing mass within the left cerebellum measures 1.6 x 1.3 x 1.5 cm. Moderate surrounding edema. Resultant posterior fossa mass effect with partial effacement of the fourth ventricle. No evidence of hydrocephalus. 10 mm focus of enhancement within the left parietal calvarium with adjacent focal dural thickening and enhancement. 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 Mild chronic small vessel ischemic disease. Electronically Signed   By: Kellie Simmering DO   On: 09/30/2019 16:04   NM PET Image Initial (PI) Skull Base To Thigh  Result Date: 10/01/2019 CLINICAL DATA:  Initial treatment strategy for pulmonary mass. EXAM: NUCLEAR MEDICINE PET SKULL BASE TO THIGH TECHNIQUE: 8.1 mCi F-18 FDG was injected intravenously. Full-ring PET imaging was performed from the skull base to thigh after the radiotracer. CT data was obtained and used for attenuation correction and anatomic localization. Fasting blood glucose: 125 mg/dl COMPARISON:  Chest CT 09/24/2019 FINDINGS: Mediastinal blood pool activity: SUV max 2.7 Liver activity: SUV max NA NECK: Lower left cervical node is hypermetabolic with SUV max = 7.5. Incidental CT findings: none CHEST: Hypermetabolic lymphadenopathy is identified in the right axilla, right thoracic inlet, mediastinum, and right hilum. Index right paratracheal node on 78/3 measures 2.6 cm short axis with SUV max = 10.5. Index subcarinal lymph node on 89/3 measures 2.3 cm with SUV max = 9.8. Left hilar lymphadenopathy is hypermetabolic with SUV max = 9.1. Right infrahilar mass lesion better characterized on recent diagnostic chest CT is hypermetabolic with SUV max = 41.9. 9 mm posterior right lower  lobe nodule on 87/3 shows no discernible hypermetabolism on PET imaging. Incidental CT findings: Heart is enlarged. Trace pericardial effusion. Coronary artery calcification is evident. Atherosclerotic calcification is noted in the wall of the thoracic aorta. Please see report for recent diagnostic CT of the chest. ABDOMEN/PELVIS: No abnormal hypermetabolic activity within the liver, pancreas, adrenal glands, or spleen. No hypermetabolic lymph nodes in the abdomen or pelvis. Incidental CT findings: Gallbladder is surgically absent.There is abdominal aortic atherosclerosis without aneurysm. SKELETON: Scattered hypermetabolic bone metastases are identified including posterior right second rib ( SUV max = 4.6), left iliac crest (SUV max = 4.6), and anterior right iliac bone ( SUV max = 5.5). Hypermetabolic subcutaneous nodules are identified. 5 mm subcutaneous nodule lateral left abdominal wall (147/3) demonstrates SUV max = 1.9. 5 mm subcutaneous nodule superficial to the right rectus  sheath on image 194/3 demonstrates SUV max = 2.3. 7 mm subcutaneous nodule lateral right hip on 246/3 demonstrates SUV max = 3.4. Incidental CT findings: Above described hypermetabolic bone lesion show no CT correlate. IMPRESSION: 1. Markedly hypermetabolic infrahilar right lower lobe mass with hypermetabolic nodal metastases in the right hilum, mediastinum, right supraclavicular region, left axilla and lower left neck. 2. Hypermetabolic rib, vertebral body, and iliac bone lesions consistent with metastatic involvement. 3. Tiny hypermetabolic subcutaneous nodules also concerning for metastatic disease. 4.  Aortic Atherosclerois (ICD10-170.0) Electronically Signed   By: Misty Stanley M.D.   On: 10/01/2019 15:31    Labs:  CBC: Recent Labs    09/29/19 0120  WBC 10.5  HGB 12.0  HCT 36.7  PLT 392    COAGS: No results for input(s): INR, APTT in the last 8760 hours.  BMP: Recent Labs    09/24/19 1141 09/29/19 0120  NA  --   141  K  --  3.0*  CL  --  103  CO2  --  22  GLUCOSE  --  163*  BUN  --  11  CALCIUM  --  9.3  CREATININE 0.50 0.64  GFRNONAA  --  >60  GFRAA  --  >60    LIVER FUNCTION TESTS: Recent Labs    09/29/19 0120  BILITOT 0.3  AST 22  ALT 24  ALKPHOS 88  PROT 6.6  ALBUMIN 3.4*    TUMOR MARKERS: No results for input(s): AFPTM, CEA, CA199, CHROMGRNA in the last 8760 hours.  Assessment and Plan:  Lung mass Lymphadenopathy Brain mets Scheduled for L cervical LN bx for diagnosis Risks and benefits of L Cervical LN Bx was discussed with the patient and/or patient's family including, but not limited to bleeding, infection, damage to adjacent structures or low yield requiring additional tests.  All of the questions were answered and there is agreement to proceed.  Consent signed and in chart.   Thank you for this interesting consult.  I greatly enjoyed meeting Virginia [name] and look forward to participating in their care.  A copy of this report was sent to the requesting provider on this date.  Electronically Signed: Lavonia Drafts, [name] 10/08/2019, 12:31 PM   I spent a total of   30 minutes   in face to face in clinical consultation, greater than 50% of which was counseling/coordinating care for left cervical lymph node bx

## 2019-10-08 NOTE — Sedation Documentation (Signed)
Pt tolerated procedure well without sedation. Vitals are stable. Bandaid to left neck without drainage.

## 2019-10-08 NOTE — Procedures (Signed)
Interventional Radiology Procedure:   Indications: Right lung mass with lymphadenopathy, needs tissue diagnosis  Procedure: US guided core biopsy of left cervical lymph node biopsy  Findings: Prominent left cervical node adjacent to left IJ and left CCA.  4 cores obtained.  Specimens placed in saline.  Complications: None     EBL: less than 10 ml  Plan: Observe 30 minutes and then discharge to home.     Willem Klingensmith R. Anselm Pancoast, MD  Pager: [phone]

## 2019-10-09 ENCOUNTER — Telehealth: Payer: Self-pay | Admitting: Emergency Medicine

## 2019-10-09 NOTE — Telephone Encounter (Signed)
She is scheduled for Med Onc initial visit on 2/1. Will forward this preliminary path data to Dr Lorenso Courier

## 2019-10-09 NOTE — Telephone Encounter (Signed)
Called and left pt's husband [name] Juanda Crumble) message >> nodal bx data shows NSCLCA, subtype still pending. Let them know that I would forward to oncology, that they absolutely need to keep her appt on 10/12/19. I will try to call them again to insure no questions.

## 2019-10-09 NOTE — Telephone Encounter (Signed)
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 Planning to send for molecular studies as well.

## 2019-10-11 NOTE — Progress Notes (Signed)
[facility] [phone]   Fax:([phone] [place] NOTE  Patient Care Team: Elby Showers, MD as PCP - General (Internal Medicine)  Hematological/Oncological History # Metastatic Adenocarcinoma of the Lung 1) 09/29/2019: patient presented to the ED after fall down stairs. CT of the head showed showed concerning for metastatic disease involving the left cerebellum and right occipital lobe.  2) 09/30/2019: MRI brain confirms mass within the left cerebellum measures 1.6 x 1.3 x 1.5 cm as well as at least 8 metastatic lesions within the supratentorial and infratentorial brain as outlined 3) 10/01/2019: PET CT scan revealed hypermetabolic infrahilar right lower lobe mass with hypermetabolic nodal metastases in the right hilum, mediastinum, right supraclavicular region, left axilla and lower left neck. 4) 10/08/2019: US guided biopsy of the lymph nodes confirms poorly differentiated non-small cell carcinoma. Immunohistochemistry confirms adenocarcinoma of lung primary. PD-L1 and NGS testing pending.  5) 10/12/2019: Establish care with Dr. Lorenso Courier   CHIEF COMPLAINTS/PURPOSE OF CONSULTATION:  Metastatic Adenocarcinoma of the Lung  HISTORY OF PRESENTING ILLNESS:  Virginia [name] 64 y.o. female with medical history significant for GERD, anxiety, and HTN who presents for evaluation of newly diagnosed metastatic adenocarcinoma of the lung.   On review of the previous records Virginia [name] (pronounced "Jasmine December") presented the emergency department on 09/29/2019 after a fall down the stairs.  Alcohol was reportedly involved with this incident.  The patient underwent a CT scan of the head which revealed findings concerning for metastatic disease involving the left cerebellum and right occipital lobe.  The following day she underwent an MRI of the brain on 09/30/2019 which confirmed a mass within the left cerebellum measuring 1.6 x 1.3 x 1.5 cm as well as at least 8 metastatic  lesions within the supratentorial and infratentorial.  On 10/01/2019 she underwent a PET CT scan which revealed a hypermetabolic infrahilar right lower lobe mass in the lung as well as nodal metastases of the right hilum mediastinum right supraclavicular region left axilla and left neck..  Biopsy was subsequently performed on 10/08/2019 and confirm the diagnosis of adenocarcinoma of lung primary. She was referred to oncology for further evaluation and management.   On exam today [name] is accompanied by her husband.  She reports that she continues to have pain in the back of her head following the fall.  Husband notes that she did have symptoms of confusion, slowed speech and some slurring which have slowly been resolving.  He is concerned that these may be related to either the tumor versus the concussion.  She does note that she has a headache if she lays back on that part of the head.  She notes that she does feel "fuzzy".  Patient does have some symptoms of wheezing on exertion and reportedly has lost 10 pounds in the last few months.  The patient's voice is also reportedly raspy.  Full 10 point ROS is listed below.  On further discussion the patient is a former smoker and quit in 2014.  She smoked for approximately 40 years prior to that at 1/2 to 1 pack/day.  She has a family history of a sister who died of a brain tumor.  She has no other remarkable past medical history.  MEDICAL HISTORY:  Past Medical History:  Diagnosis Date  . Anxiety   . GERD (gastroesophageal reflux disease)   . Hypercholesterolemia    per pt, she does not have elevated lipids  . Hypertension   . Tobacco abuse  SURGICAL HISTORY: Past Surgical History:  Procedure Laterality Date  . ABDOMINAL HYSTERECTOMY     partial/ left ovaries  . CHOLECYSTECTOMY    . DILATION AND CURETTAGE OF UTERUS    . LIPOMA EXCISION  2018   removed under left breast and right thigh.  . TUBAL LIGATION      SOCIAL HISTORY: Social  History   Socioeconomic History  . Marital status: Married    Spouse name: Not on file  . Number of children: 3  . Years of education: Not on file  . Highest education level: Not on file  Occupational History  . Occupation: owns Medical illustrator: [employer]  Tobacco Use  . Smoking status: Former Smoker    Packs/day: 0.25    Quit date: 10/31/2014    Years since quitting: 4.9  . Smokeless tobacco: Never Used  Substance and Sexual Activity  . Alcohol use: Yes    Alcohol/week: 0.0 standard drinks    Comment: rare  . Drug use: No  . Sexual activity: Yes  Other Topics Concern  . Not on file  Social History Narrative  . Not on file   Social Determinants of Health   Financial Resource Strain:   . Difficulty of Paying Living Expenses: Not on file  Food Insecurity:   . Worried About Charity fundraiser in the Last Year: Not on file  . Ran Out of Food in the Last Year: Not on file  Transportation Needs:   . Lack of Transportation (Medical): Not on file  . Lack of Transportation (Non-Medical): Not on file  Physical Activity:   . Days of Exercise per Week: Not on file  . Minutes of Exercise per Session: Not on file  Stress:   . Feeling of Stress : Not on file  Social Connections:   . Frequency of Communication with Friends and Family: Not on file  . Frequency of Social Gatherings with Friends and Family: Not on file  . Attends Religious Services: Not on file  . Active Member of Clubs or Organizations: Not on file  . Attends Archivist Meetings: Not on file  . Marital Status: Not on file  Intimate Partner Violence:   . Fear of Current or Ex-Partner: Not on file  . Emotionally Abused: Not on file  . Physically Abused: Not on file  . Sexually Abused: Not on file    FAMILY HISTORY: Family History  Problem Relation Age of Onset  . Heart disease Father   . Drug abuse Daughter   . Drug abuse Son   . Cancer Sister   . Heart disease Brother   . Heart  attack Brother     ALLERGIES:  has No Known Allergies.  MEDICATIONS:  Current Outpatient Medications  Medication Sig Dispense Refill  . acetaminophen (TYLENOL) 500 MG tablet Take 500-1,000 mg by mouth every 6 (six) hours as needed (for pain.).    Marland Kitchen albuterol (VENTOLIN HFA) 108 (90 Base) MCG/ACT inhaler Inhale 2 puffs into the lungs every 6 (six) hours as needed for wheezing or shortness of breath. 8 g 0  . ALPRAZolam (XANAX) 0.25 MG tablet TAKE 1 TABLET BY MOUTH TWICE DAILY AS NEEDED FOR ANXIETY (Patient taking differently: Take 0.25 mg by mouth 2 (two) times daily as needed for anxiety. ) 60 tablet 5  . APPLE CIDER VINEGAR PO Take 450 mg by mouth daily.    Marland Kitchen aspirin 325 MG EC tablet Take 325 mg by mouth every  8 (eight) hours as needed for pain.    . Budeson-Glycopyrrol-Formoterol (BREZTRI AEROSPHERE) 160-9-4.8 MCG/ACT AERO Inhale 2 puffs into the lungs 2 (two) times daily.    . Carboxymethylcellul-Glycerin (CLEAR EYES FOR DRY EYES) 1-0.25 % SOLN Place 1 drop into both eyes 3 (three) times daily as needed.    Marland Kitchen dexamethasone (DECADRON) 2 MG tablet Take 1 tablet (2 mg total) by mouth 2 (two) times daily. 60 tablet 2  . ELDERBERRY PO Take 1,250 mg by mouth daily.    . hydrochlorothiazide (HYDRODIURIL) 25 MG tablet TAKE 1 TABLET(25 MG) BY MOUTH DAILY (Patient taking differently: Take 25 mg by mouth daily. ) 90 tablet 0  . ibuprofen (ADVIL,MOTRIN) 800 MG tablet take 1 tablet by mouth every 8 hours if needed for pain (Patient taking differently: Take 800 mg by mouth every 8 (eight) hours as needed (pain.). ) 90 tablet 1  . L-Lysine 500 MG TABS Take 500 mg by mouth 2 (two) times daily as needed (cold sores/fever blisters.).    Marland Kitchen losartan (COZAAR) 100 MG tablet TAKE 1 TABLET(100 MG) BY MOUTH DAILY (Patient taking differently: Take 100 mg by mouth daily. TAKE 1 TABLET(100 MG) BY MOUTH DAILY) 90 tablet 0  . ondansetron (ZOFRAN) 4 MG tablet Take 1 tablet (4 mg total) by mouth every 8 (eight) hours as  needed for nausea or vomiting. (Patient not taking: Reported on 10/12/2019) 20 tablet 0  . OVER THE COUNTER MEDICATION Apply 1 application topically daily as needed (shoulder pain.). [place] Gel OTC    . pantoprazole (PROTONIX) 40 MG tablet TAKE 1 TABLET(40 MG) BY MOUTH DAILY 90 tablet 3  . Probiotic Product (EQL PROBIOTIC COLON SUPPORT) CAPS Take 1 capsule by mouth daily.    . psyllium (METAMUCIL) 58.6 % powder Take 1 packet by mouth daily.    . rosuvastatin (CRESTOR) 5 MG tablet TAKE 1 TABLET BY MOUTH THREE TIMES WEEKLY AS DIRECTED (Patient taking differently: Take 5 mg by mouth every Monday, Wednesday, and Friday. TAKE 1 TABLET BY MOUTH THREE TIMES WEEKLY AS DIRECTED) 36 tablet 1  . Vitamin D3 (VITAMIN D) 25 MCG tablet Take 2,000 Units by mouth daily.     No current facility-administered medications for this visit.    REVIEW OF SYSTEMS:   Constitutional: ( - ) fevers, ( - )  chills , ( - ) night sweats (+) headache Eyes: ( - ) blurriness of vision, ( - ) double vision, ( - ) watery eyes Ears, nose, mouth, throat, and face: ( - ) mucositis, ( - ) sore throat Respiratory: ( - ) cough, ( - ) dyspnea, ( - ) wheezes Cardiovascular: ( - ) palpitation, ( - ) chest discomfort, ( - ) lower extremity swelling Gastrointestinal:  ( - ) nausea, ( - ) heartburn, ( - ) change in bowel habits Skin: ( - ) abnormal skin rashes Lymphatics: ( - ) new lymphadenopathy, ( - ) easy bruising Neurological: ( - ) numbness, ( - ) tingling, ( - ) new weaknesses Behavioral/Psych: ( - ) mood change, ( - ) new changes  All other systems were reviewed with the patient and are negative.  PHYSICAL EXAMINATION: ECOG PERFORMANCE STATUS: 1 - Symptomatic but completely ambulatory  Vitals:   10/12/19 1325  BP: 120/87  Pulse: 64  Resp: 17  Temp: 98 F (36.7 C)  SpO2: 99%   Filed Weights   10/12/19 1325  Weight: 149 lb (67.6 kg)    GENERAL: well appearing middle aged Caucasian female  in NAD  HEAD: large dark healing  scab on back right of head. Tender to palpation.  SKIN: skin color, texture, turgor are normal, no rashes or significant lesions EYES: conjunctiva are pink and non-injected, sclera clear LUNGS: clear to auscultation and percussion with normal breathing effort HEART: regular rate & rhythm and no murmurs and no lower extremity edema Musculoskeletal: no cyanosis of digits and no clubbing  PSYCH: alert & oriented x 3, fluent speech NEURO: no focal motor/sensory deficits  LABORATORY DATA:  I have reviewed the data as listed CBC Latest Ref Rng & Units 09/29/2019 11/15/2017 04/12/2017  WBC 4.0 - 10.5 K/uL 10.5 7.2 6.9  Hemoglobin 12.0 - 15.0 g/dL 12.0 12.5 12.5  Hematocrit 36.0 - 46.0 % 36.7 35.9 36.7  Platelets 150 - 400 K/uL 392 377 320    CMP Latest Ref Rng & Units 09/29/2019 09/24/2019 10/07/2018  Glucose 70 - 99 mg/dL 163(H) - -  BUN 8 - 23 mg/dL 11 - -  Creatinine 0.44 - 1.00 mg/dL 0.64 0.50 -  Sodium 135 - 145 mmol/L 141 - -  Potassium 3.5 - 5.1 mmol/L 3.0(L) - -  Chloride 98 - 111 mmol/L 103 - -  CO2 22 - 32 mmol/L 22 - -  Calcium 8.9 - 10.3 mg/dL 9.3 - -  Total Protein 6.5 - 8.1 g/dL 6.6 - 6.6  Total Bilirubin 0.3 - 1.2 mg/dL 0.3 - 0.6  Alkaline Phos 38 - 126 U/L 88 - -  AST 15 - 41 U/L 22 - 14  ALT 0 - 44 U/L 24 - 18     PATHOLOGY: SURGICAL PATHOLOGY  * THIS IS AN ADDENDUM REPORT *  CASE: [id number]  PATIENT: Amybeth Olano  Surgical Pathology Report  *Addendum *  Reason for Addendum #1: Immunohistochemistry results   Clinical History: right lung mass, scattered lymphadenopathy (cm)   FINAL MICROSCOPIC DIAGNOSIS:   A. LYMPH NODE, LEFT CERVICAL, NEEDLE CORE BIOPSY:  - Poorly differentiated non-small cell carcinoma.  - See comment.   COMMENT:  Immunohistochemistry will be performed and reported as an addendum.   Called to Dr. Lamonte Sakai on 10/09/2019.   GROSS DESCRIPTION:  The specimen is received in saline and consists of 4 cores of tan soft  tissue, ranging from  0.6 to 0.8 cm in length by 0.1 cm in diameter.  Representative cores placed in RPMI for possible flow cytometry,  remaining tissue is entirely submitted in 1 cassette. Craig Staggers 10/12/2019)   Final Diagnosis performed by Claudette Laws, MD.  Electronically signed  10/09/2019  Technical component performed at [facility]. [facility], [place] [address], [place], [place] [address].  Professional component performed at [facility],  [place] [address], [place], [place] [address].  Immunohistochemistry Technical component (if applicable) was performed  at [facility]. [address], [place],  [place], [place] [address].  IMMUNOHISTOCHEMISTRY DISCLAIMER (if applicable):  Some of these immunohistochemical stains may have been developed and the  performance characteristics determine by [facility]. Some  may not have been cleared or approved by the U.S. Food and Drug  Administration. The FDA has determined that such clearance or approval  is not necessary. This test is used for clinical purposes. It should not  be regarded as investigational or for research. This laboratory is  certified under the [place]  (CLIA-88) as qualified to perform high complexity clinical laboratory  testing. The controls stained appropriately.   ADDENDUM: Immunohistochemistry shows the  tumor is positive with TTF-1  and Napsin-A and is negative with cytokeratin 5/6, p40 and p63. The  immunophenotype is consistent with primary lung adenocarcinoma.   Addendum #1 performed by Claudette Laws, MD.  Electronically signed  10/12/2019  Technical component performed at [facility]. [facility], [place] [address], [place], [place] [address].  Professional component performed at [facility],  [place] [address] [place], [place] [address].  Immunohistochemistry Technical component (if applicable) was performed  at  [facility]. [address] [place],  [place],  [address].  IMMUNOHISTOCHEMISTRY DISCLAIMER (if applicable):  Some of these immunohistochemical stains may have been developed and the  performance characteristics determine by [facility]. Some  may not have been cleared or approved by the U.S. Food and Drug  Administration. The FDA has determined that such clearance or approval  is not necessary. This test is used for clinical purposes. It should not  be regarded as investigational or for research. This laboratory is  certified under the [place]  (CLIA-88) as qualified to perform high complexity clinical laboratory  testing. The controls stained appropriately.    RADIOGRAPHIC STUDIES: I have personally reviewed the radiological images as listed and agreed with the findings in the report: RLL lobe lung mass on CT scan. PET shows hypermetabolic activity in lung and numerous increased lymph nodes. MRI brain shows large left cerebellar lesion with numerous other brain mets.   DG Chest 2 View  Result Date: 09/29/2019 CLINICAL DATA:  Screening. Fall and loss of consciousness. EXAM: CHEST - 2 VIEW COMPARISON:  Chest CT 09/24/2019 FINDINGS: Right infrahilar opacity corresponds to pulmonary mass on CT. Adjacent streaky opacities likely postobstructive atelectasis. Left lung is clear. Normal heart size. No pneumothorax or pleural fluid. No acute osseous abnormalities are seen. There are no radiopaque foreign bodies or medical device in the chest. Multiple overlying EKG leads. IMPRESSION: Right infrahilar opacity corresponds to pulmonary mass on CT. Adjacent streaky opacities likely postobstructive atelectasis. No radiopaque foreign body or metallic device to preclude MRI imaging. Electronically Signed   By: Keith Rake M.D.   On: 09/29/2019 02:00   CT Head Wo Contrast  Result Date: 09/29/2019 CLINICAL DATA:  Neck  trauma.  Pain status post fall. EXAM: CT HEAD WITHOUT CONTRAST CT CERVICAL SPINE WITHOUT CONTRAST TECHNIQUE: Multidetector CT imaging of the head and cervical spine was performed following the standard protocol without intravenous contrast. Multiplanar CT image reconstructions of the cervical spine were also generated. COMPARISON:  None. FINDINGS: CT HEAD FINDINGS Brain: There is extensive vasogenic edema involving the left cerebellum there is suggestion of an underlying mass measuring approximately 1.8 cm. There is vasogenic edema in the right occipital lobe. There may be some vasogenic edema within the right frontal parietal lobe. There is no midline shift. No acute intracranial hemorrhage. There is some artifact involving the low anterior frontal lobe, less likely acute subarachnoid hemorrhage. Vascular: No hyperdense vessel or unexpected calcification. Skull: Normal. Negative for fracture or focal lesion.There is right posterior scalp swelling without evidence for an underlying fracture. Sinuses/Orbits: There is mild mucosal thickening of the maxillary sinuses bilaterally. The remaining paranasal sinuses and mastoid air cells are essentially clear. Other: None. CT CERVICAL SPINE FINDINGS Alignment: There is straightening of the normal cervical lordotic curvature. Skull base and vertebrae: No acute fracture. No primary bone lesion or focal pathologic process. Soft tissues and spinal canal: No prevertebral fluid or swelling. No visible canal hematoma.  Disc levels: The disc heights are relatively well preserved throughout the cervical spine. Upper chest: Negative. Other: There is an enlarged left supraclavicular lymph node. Atherosclerotic changes are noted of the carotid arteries. IMPRESSION: 1. No acute intracranial hemorrhage. 2. No acute cervical spine fracture. 3. Findings concerning for metastatic disease involving the left cerebellum and right occipital lobe as detailed above. Follow-up with a  contrast-enhanced MRI is recommended. 4. Enlarged left supraclavicular lymph node. Electronically Signed   By: Constance Holster M.D.   On: 09/29/2019 01:46   CT Chest W Contrast  Result Date: 09/24/2019 CLINICAL DATA:  Persistent cough. Hemoptysis. Abnormal chest radiograph with right perihilar opacity. Former smoker per epic. EXAM: CT CHEST WITH CONTRAST TECHNIQUE: Multidetector CT imaging of the chest was performed during intravenous contrast administration. CONTRAST:  75m OMNIPAQUE IOHEXOL 300 MG/ML  SOLN COMPARISON:  09/08/2019 chest radiograph. FINDINGS: Cardiovascular: Normal heart size. No significant pericardial effusion/thickening. Left anterior descending coronary atherosclerosis. Atherosclerotic nonaneurysmal thoracic aorta. Normal caliber pulmonary arteries. No central pulmonary emboli. Mediastinum/Nodes: No discrete thyroid nodules. Unremarkable esophagus. Enlarged 1.1 cm right prevascular node (series 3/image 61). Right paratracheal adenopathy up to 2.2 cm (series 3/image 72). Enlarged 2.5 cm subcarinal node (series 3/image 90). Enlarged 1.5 cm left paratracheal node (series 3/image 73). Right hilar adenopathy up to 1.4 cm (series 3/image 98). No pathologically enlarged left hilar nodes. No axillary adenopathy. Lungs/Pleura: No pneumothorax. Trace dependent right pleural effusion. No left pleural effusion. Poorly marginated cavitary 5.7 x 4.3 cm central right lower lobe lung mass (series 5/image 94) abutting and distorting the right major fissure, associated with high-grade stenosis of the central right lower lobe airways. Patchy consolidation and ground-glass opacity in the right lower lobe peripheral to the central lung mass compatible with postobstructive pneumonia. A few scattered small solid pulmonary nodules in both lungs, largest 6 mm in the peripheral right middle lobe (series 5/image 97) and 3 mm in the left lower lobe (series 5/image 92). Upper abdomen: Cholecystectomy.  Musculoskeletal: No aggressive appearing focal osseous lesions. Mild thoracic spondylosis. IMPRESSION: 1. Poorly marginated cavitary 5.7 cm central right lower lobe lung mass associated with high-grade stenosis of the central right lower lobe airways, suspicious for primary bronchogenic carcinoma. 2. Postobstructive pneumonia in the peripheral right lower lobe. 3. Right hilar, subcarinal, bilateral paratracheal and right prevascular mediastinal lymphadenopathy, suspicious for metastatic disease. 4. Several subcentimeter solid pulmonary nodules in both lungs, cannot exclude pulmonary metastases. 5. Trace dependent right pleural effusion. 6. Suggest staging PET-CT and multi disciplinary thoracic oncology consultation. Aortic Atherosclerosis (ICD10-I70.0). These results will be called to the ordering clinician or representative by the Radiology Department at the imaging location. Electronically Signed   By: JIlona SorrelM.D.   On: 09/24/2019 12:50   CT Cervical Spine Wo Contrast  Result Date: 09/29/2019 CLINICAL DATA:  Neck trauma.  Pain status post fall. EXAM: CT HEAD WITHOUT CONTRAST CT CERVICAL SPINE WITHOUT CONTRAST TECHNIQUE: Multidetector CT imaging of the head and cervical spine was performed following the standard protocol without intravenous contrast. Multiplanar CT image reconstructions of the cervical spine were also generated. COMPARISON:  None. FINDINGS: CT HEAD FINDINGS Brain: There is extensive vasogenic edema involving the left cerebellum there is suggestion of an underlying mass measuring approximately 1.8 cm. There is vasogenic edema in the right occipital lobe. There may be some vasogenic edema within the right frontal parietal lobe. There is no midline shift. No acute intracranial hemorrhage. There is some artifact involving the low anterior frontal lobe, less likely acute  subarachnoid hemorrhage. Vascular: No hyperdense vessel or unexpected calcification. Skull: Normal. Negative for fracture or  focal lesion.There is right posterior scalp swelling without evidence for an underlying fracture. Sinuses/Orbits: There is mild mucosal thickening of the maxillary sinuses bilaterally. The remaining paranasal sinuses and mastoid air cells are essentially clear. Other: None. CT CERVICAL SPINE FINDINGS Alignment: There is straightening of the normal cervical lordotic curvature. Skull base and vertebrae: No acute fracture. No primary bone lesion or focal pathologic process. Soft tissues and spinal canal: No prevertebral fluid or swelling. No visible canal hematoma. Disc levels: The disc heights are relatively well preserved throughout the cervical spine. Upper chest: Negative. Other: There is an enlarged left supraclavicular lymph node. Atherosclerotic changes are noted of the carotid arteries. IMPRESSION: 1. No acute intracranial hemorrhage. 2. No acute cervical spine fracture. 3. Findings concerning for metastatic disease involving the left cerebellum and right occipital lobe as detailed above. Follow-up with a contrast-enhanced MRI is recommended. 4. Enlarged left supraclavicular lymph node. Electronically Signed   By: Constance Holster M.D.   On: 09/29/2019 01:46   MR BRAIN W WO CONTRAST  Result Date: 09/30/2019 CLINICAL DATA:  Metastatic malignant neoplasm, unspecified site. Additional history provided: Patient with history of lung cancer EXAM: MRI HEAD WITHOUT AND WITH CONTRAST TECHNIQUE: Multiplanar, multiecho pulse sequences of the brain and surrounding structures were obtained without and with intravenous contrast. CONTRAST:  7.59m GADAVIST GADOBUTROL 1 MMOL/ML IV SOLN COMPARISON:  Head CT 09/29/2019 FINDINGS: Brain: There are multiple enhancing intracranial metastases as follows. An index peripherally enhancing metastasis within the paramedian left cerebellum measures 1.6 x 1.3 x 1.5 cm (series 12, image 37). Moderate surrounding edema. 4 mm lesion within the right cerebellum (series 12, image 37). Mild  surrounding edema. Punctate lesion within the midline inferior cerebellum (series 12, image 34). 5 mm peripherally enhancing lesion within the right occipital lobe (series 12, image 61). Mild surrounding edema. 2 mm lesion within the right parietal lobe (series 12, image 86). Minimal associated edema. 3 mm peripherally enhancing lesion within right parietal lobe (series 12, image 101). Minimal associated edema. Punctate lesion within the left precentral gyrus (series 12, image 118). Mild surrounding edema. 3 mm lesion within the anterior left frontal lobe (series 12, image 121). An additional 2 mm peripherally enhancing metastasis is questioned within the quadrigeminal plate cistern along the internal cerebral veins (series 12, image 75), although this is not definite. Posterior fossa mass effect related to the moderate edema associated with the dominant left cerebellar metastasis. Partial effacement of the fourth ventricle. No evidence of hydrocephalus on the current examination. No evidence of acute infarct. No supratentorial midline shift. No extra-axial fluid collection. No chronic intracranial blood products. Additional mild scattered T2/FLAIR hyperintensity within the cerebral white matter is nonspecific, but consistent with chronic small vessel ischemic disease. Cerebral volume is normal for age. Vascular: Flow voids maintained within the proximal large arterial vessels. 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 3 mm enhancing focus within right occipital calvarium which appears to demonstrate precontrast T1 hyperintensity and is likely benign. Sinuses/Orbits: Visualized orbits demonstrate no acute abnormality. Left maxillary sinus mucous retention cyst. Minimal ethmoid sinus mucosal thickening. No significant mastoid  effusion. These results will be called to the ordering clinician or representative by the Radiologist Assistant, and communication documented in the PACS or zVision Dashboard. IMPRESSION: There  are least 8 metastatic lesions within the supratentorial and infratentorial brain as outlined. An index peripherally enhancing mass within the left cerebellum measures 1.6 x 1.3 x 1.5 cm. Moderate surrounding edema. Resultant posterior fossa mass effect with partial effacement of the fourth ventricle. No evidence of hydrocephalus. 10 mm focus of enhancement within the left parietal calvarium with adjacent focal dural thickening and enhancement. 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 Mild chronic small vessel ischemic disease. Electronically Signed   By: Kellie Simmering DO   On: 09/30/2019 16:04   NM PET Image Initial (PI) Skull Base To Thigh  Result Date: 10/01/2019 CLINICAL DATA:  Initial treatment strategy for pulmonary mass. EXAM: NUCLEAR MEDICINE PET SKULL BASE TO THIGH TECHNIQUE: 8.1 mCi F-18 FDG was injected intravenously. Full-ring PET imaging was performed from the skull base to thigh after the radiotracer. CT data was obtained and used for attenuation correction and anatomic localization. Fasting blood glucose: 125 mg/dl COMPARISON:  Chest CT 09/24/2019 FINDINGS: Mediastinal blood pool activity: SUV max 2.7 Liver activity: SUV max NA NECK: Lower left cervical node is hypermetabolic with SUV max = 7.5. Incidental CT findings: none CHEST: Hypermetabolic lymphadenopathy is identified in the right axilla, right thoracic inlet, mediastinum, and right hilum. Index right paratracheal node on 78/3 measures 2.6 cm short axis with SUV max = 10.5. Index subcarinal lymph node on 89/3 measures 2.3 cm with SUV max = 9.8. Left hilar lymphadenopathy is hypermetabolic with SUV max = 9.1. Right infrahilar mass lesion better characterized on recent diagnostic chest CT is hypermetabolic with SUV max =  86.7. 9 mm posterior right lower lobe nodule on 87/3 shows no discernible hypermetabolism on PET imaging. Incidental CT findings: Heart is enlarged. Trace pericardial effusion. Coronary artery calcification is evident. Atherosclerotic calcification is noted in the wall of the thoracic aorta. Please see report for recent diagnostic CT of the chest. ABDOMEN/PELVIS: No abnormal hypermetabolic activity within the liver, pancreas, adrenal glands, or spleen. No hypermetabolic lymph nodes in the abdomen or pelvis. Incidental CT findings: Gallbladder is surgically absent.There is abdominal aortic atherosclerosis without aneurysm. SKELETON: Scattered hypermetabolic bone metastases are identified including posterior right second rib ( SUV max = 4.6), left iliac crest (SUV max = 4.6), and anterior right iliac bone ( SUV max = 5.5). Hypermetabolic subcutaneous nodules are identified. 5 mm subcutaneous nodule lateral left abdominal wall (147/3) demonstrates SUV max = 1.9. 5 mm subcutaneous nodule superficial to the right rectus sheath on image 194/3 demonstrates SUV max = 2.3. 7 mm subcutaneous nodule lateral right hip on 246/3 demonstrates SUV max = 3.4. Incidental CT findings: Above described hypermetabolic bone lesion show no CT correlate. IMPRESSION: 1. Markedly hypermetabolic infrahilar right lower lobe mass with hypermetabolic nodal metastases in the right hilum, mediastinum, right supraclavicular region, left axilla and lower left neck. 2. Hypermetabolic rib, vertebral body, and iliac bone lesions consistent with metastatic involvement. 3. Tiny hypermetabolic subcutaneous nodules also concerning for metastatic disease. 4.  Aortic Atherosclerois (ICD10-170.0) Electronically Signed   By: Misty Stanley M.D.   On: 10/01/2019 15:31   [name] CORE BIOPSY (LYMPH NODES)  Result Date: 10/08/2019 INDICATION: 64 year old with right lung mass and lymphadenopathy. Tissue diagnosis is needed. Patient has a hypermetabolic left cervical  lymph node on recent PET-CT. EXAM: ULTRASOUND-GUIDED CORE BIOPSY OF LEFT CERVICAL LYMPH NODE MEDICATIONS: None. ANESTHESIA/SEDATION: None FLUOROSCOPY TIME:  None COMPLICATIONS: None immediate. PROCEDURE: The procedure was explained to the patient. The risks and benefits of the procedure were discussed and the  patient's questions were addressed. Informed consent was obtained from the patient. The left side of the neck was evaluated with ultrasound. Suspicious hypoechoic lymph node was identified just lateral to the left common carotid artery. Skin was prepped with chlorhexidine and sterile field was created. Skin and soft tissues were anesthetized with 1% lidocaine. Using ultrasound guidance, 18 gauge core device was directed into the lymph node. Total of 4 core biopsies were obtained. Specimens placed in saline. Bandage placed over the puncture site. FINDINGS: Suspicious hypoechoic lymph node just posterior to the left internal jugular vein and lateral to the left common carotid artery. Lymph node measures 1.0 cm in the short axis. This lymph node corresponds with the hypermetabolic lymph node on recent PET-CT. Four adequate core biopsies were obtained. No significant bleeding or hematoma formation following the core biopsies. IMPRESSION: Ultrasound-guided core biopsies of the suspicious left cervical lymph node. Electronically Signed   By: Markus Daft M.D.   On: 10/08/2019 14:37    ASSESSMENT & PLAN JOVONDA SELNER 64 y.o. female with medical history significant for GERD, anxiety, and HTN who presents for evaluation of newly diagnosed metastatic adenocarcinoma of the lung.  After review of the pathology and of the imaging her findings are most consistent with a metastatic adenocarcinoma the lung with spread to localize lymph nodes, osseous structures, and the brain.  Given these findings it would be best to progress with systemic therapy and given the patient's good functional status I think should be an  excellent candidate for carboplatin pemetrexed and pembrolizumab.  Prior to starting the systemic therapy I would like to have her brain tumor reviewed by Dr. Mickeal Skinner and radiation oncology.  The patient is specifically requested evaluation by neurology given the symptoms that she has been experiencing post fall including headache, brain fog, and some confusion.  I think it would be reasonable to have Dr. Mickeal Skinner evaluate to assure that the patient does not have symptoms of a concussion in addition to the brain tumor.  If no radiation oncology intervention is recommended then we will begin treatment with systemic chemotherapy.  Today we discussed the overall prognosis of adenocarcinoma the lung, noting that the 5-year survival of this cancer is very poor.  I also noted that with specific genetic mutations that prognosis could improve.  We discussed the treatment of carboplatin/pemetrexed/pembrolizumab and what to expect regarding this treatment.  We will plan for a full visit prior to starting therapy for detailed discussion regarding this regimen.  All questions that the patient and her husband had were answered during our visit today.  We will plan to see the patient back in approximately 2 weeks for presumed starting of chemotherapy.  # Metastatic Adenocarcinoma of the Lung --will discuss the care of this patient with neuro-oncology and consider radiation oncology for treatment of the metastatic disease to the brain --[facility] and PD-L1 studies to be ordered today with the assistance of our lung navigator.  --will tentatively plan to start empiric chemotherapy with carboplatin/pemetrexed/pembrolizumab until these studies result. Will assure that no intervention is required on the brain tumor first. Possible start in approximately 2 weeks time. --will schedule port placement in the interim.   --patient has an ECOG 1 and is an excellent candidate for treatment.  --RTC in 2 weeks for possible start of  therapy.   #Headache #Possible Concussion --on patient request, will refer to Dr. Mickeal Skinner for evaluation of the headaches, concern for concussion, and assessment of the brain lesion --will discuss with  Dr. Mickeal Skinner and Rad/Onc the utility of radiation to the brain lesion --continue to monitor   Orders Placed This Encounter  Procedures  . IR Perc Tun Perit Cath W/Port    Standing Status:   Future    Standing Expiration Date:   12/10/2020    Order Specific Question:   Reason for exam:    Answer:   for administration of chemotherapy    Order Specific Question:   Preferred Imaging Location?    Answer:   [facility]    Order Specific Question:   Release to patient    Answer:   Immediate   All questions were answered. The patient knows to call the clinic with any problems, questions or concerns.  A total of more than 60 minutes were spent on this encounter and over half of that time was spent on counseling and coordination of care as outlined above.   Ledell Peoples, MD Department of Hematology/Oncology [place] at [facility] Phone: [phone] Pager: [phone] Email: [email or web address]  10/13/2019 6:31 PM

## 2019-10-12 ENCOUNTER — Inpatient Hospital Stay: Payer: BC Managed Care – PPO

## 2019-10-12 ENCOUNTER — Other Ambulatory Visit: Payer: Self-pay

## 2019-10-12 ENCOUNTER — Encounter: Payer: Self-pay | Admitting: *Deleted

## 2019-10-12 ENCOUNTER — Inpatient Hospital Stay: Payer: BC Managed Care – PPO | Attending: Hematology and Oncology | Admitting: Hematology and Oncology

## 2019-10-12 VITALS — BP 120/87 | HR 64 | Temp 98.0°F | Resp 17 | Ht 62.0 in | Wt 149.0 lb

## 2019-10-12 DIAGNOSIS — C778 Secondary and unspecified malignant neoplasm of lymph nodes of multiple regions: Secondary | ICD-10-CM | POA: Diagnosis not present

## 2019-10-12 DIAGNOSIS — C7951 Secondary malignant neoplasm of bone: Secondary | ICD-10-CM

## 2019-10-12 DIAGNOSIS — R0902 Hypoxemia: Secondary | ICD-10-CM | POA: Diagnosis not present

## 2019-10-12 DIAGNOSIS — R42 Dizziness and giddiness: Secondary | ICD-10-CM | POA: Insufficient documentation

## 2019-10-12 DIAGNOSIS — R410 Disorientation, unspecified: Secondary | ICD-10-CM | POA: Insufficient documentation

## 2019-10-12 DIAGNOSIS — F419 Anxiety disorder, unspecified: Secondary | ICD-10-CM | POA: Insufficient documentation

## 2019-10-12 DIAGNOSIS — Z79899 Other long term (current) drug therapy: Secondary | ICD-10-CM | POA: Diagnosis not present

## 2019-10-12 DIAGNOSIS — W19XXXA Unspecified fall, initial encounter: Secondary | ICD-10-CM | POA: Insufficient documentation

## 2019-10-12 DIAGNOSIS — R2689 Other abnormalities of gait and mobility: Secondary | ICD-10-CM | POA: Insufficient documentation

## 2019-10-12 DIAGNOSIS — I119 Hypertensive heart disease without heart failure: Secondary | ICD-10-CM | POA: Diagnosis not present

## 2019-10-12 DIAGNOSIS — C3431 Malignant neoplasm of lower lobe, right bronchus or lung: Secondary | ICD-10-CM | POA: Diagnosis not present

## 2019-10-12 DIAGNOSIS — R042 Hemoptysis: Secondary | ICD-10-CM | POA: Insufficient documentation

## 2019-10-12 DIAGNOSIS — J90 Pleural effusion, not elsewhere classified: Secondary | ICD-10-CM | POA: Insufficient documentation

## 2019-10-12 DIAGNOSIS — K219 Gastro-esophageal reflux disease without esophagitis: Secondary | ICD-10-CM

## 2019-10-12 DIAGNOSIS — R609 Edema, unspecified: Secondary | ICD-10-CM | POA: Diagnosis not present

## 2019-10-12 DIAGNOSIS — Z9181 History of falling: Secondary | ICD-10-CM | POA: Insufficient documentation

## 2019-10-12 DIAGNOSIS — Z814 Family history of other substance abuse and dependence: Secondary | ICD-10-CM | POA: Insufficient documentation

## 2019-10-12 DIAGNOSIS — R55 Syncope and collapse: Secondary | ICD-10-CM | POA: Diagnosis not present

## 2019-10-12 DIAGNOSIS — C7931 Secondary malignant neoplasm of brain: Secondary | ICD-10-CM | POA: Insufficient documentation

## 2019-10-12 DIAGNOSIS — M549 Dorsalgia, unspecified: Secondary | ICD-10-CM | POA: Diagnosis not present

## 2019-10-12 DIAGNOSIS — Z23 Encounter for immunization: Secondary | ICD-10-CM | POA: Diagnosis not present

## 2019-10-12 DIAGNOSIS — Z87891 Personal history of nicotine dependence: Secondary | ICD-10-CM | POA: Insufficient documentation

## 2019-10-12 DIAGNOSIS — Z8249 Family history of ischemic heart disease and other diseases of the circulatory system: Secondary | ICD-10-CM

## 2019-10-12 DIAGNOSIS — R519 Headache, unspecified: Secondary | ICD-10-CM | POA: Insufficient documentation

## 2019-10-12 DIAGNOSIS — R29818 Other symptoms and signs involving the nervous system: Secondary | ICD-10-CM | POA: Insufficient documentation

## 2019-10-12 DIAGNOSIS — Z9049 Acquired absence of other specified parts of digestive tract: Secondary | ICD-10-CM | POA: Insufficient documentation

## 2019-10-12 DIAGNOSIS — I700 Atherosclerosis of aorta: Secondary | ICD-10-CM | POA: Diagnosis not present

## 2019-10-12 DIAGNOSIS — R590 Localized enlarged lymph nodes: Secondary | ICD-10-CM | POA: Insufficient documentation

## 2019-10-12 DIAGNOSIS — Z809 Family history of malignant neoplasm, unspecified: Secondary | ICD-10-CM | POA: Insufficient documentation

## 2019-10-12 DIAGNOSIS — C781 Secondary malignant neoplasm of mediastinum: Secondary | ICD-10-CM | POA: Insufficient documentation

## 2019-10-12 LAB — SURGICAL PATHOLOGY

## 2019-10-12 MED ORDER — INFLUENZA VAC SPLIT QUAD 0.5 ML IM SUSY
PREFILLED_SYRINGE | INTRAMUSCULAR | Status: AC
  Filled 2019-10-12: qty 0.5

## 2019-10-12 MED ORDER — INFLUENZA VAC SPLIT QUAD 0.5 ML IM SUSY
0.5000 mL | PREFILLED_SYRINGE | Freq: Once | INTRAMUSCULAR | Status: AC
  Administered 2019-10-12: 0.5 mL via INTRAMUSCULAR

## 2019-10-12 NOTE — Progress Notes (Signed)
Oncology Nurse Navigator Documentation  Oncology Nurse Navigator Flowsheets 10/12/2019  Navigator Location [facility]  Referral Date to RadOnc/MedOnc 10/12/2019  Navigator Encounter Type Other/per Dr. Lorenso Courier he quested tissue from 10/08/19 to be sent to [facility] and PDL 1.  I updated path dept on request.  I also asked that they check to see if there is enough tissue to be sent for these test.   Treatment Phase Pre-Tx/Tx Discussion  Barriers/Navigation Needs Coordination of Care  Interventions Coordination of Care  Acuity Level 2-Minimal Needs (1-2 Barriers Identified)  Coordination of Care Other  Time Spent with Patient 30

## 2019-10-13 ENCOUNTER — Encounter: Payer: Self-pay | Admitting: Hematology and Oncology

## 2019-10-14 ENCOUNTER — Telehealth: Payer: Self-pay | Admitting: Hematology and Oncology

## 2019-10-14 NOTE — Telephone Encounter (Signed)
Scheduled per los. Called and spoke with patient. Confirmed appt 

## 2019-10-15 ENCOUNTER — Other Ambulatory Visit: Payer: Self-pay

## 2019-10-15 ENCOUNTER — Telehealth: Payer: Self-pay | Admitting: Internal Medicine

## 2019-10-15 ENCOUNTER — Inpatient Hospital Stay: Payer: BC Managed Care – PPO | Admitting: Internal Medicine

## 2019-10-15 ENCOUNTER — Other Ambulatory Visit: Payer: Self-pay | Admitting: Radiation Therapy

## 2019-10-15 DIAGNOSIS — C778 Secondary and unspecified malignant neoplasm of lymph nodes of multiple regions: Secondary | ICD-10-CM | POA: Diagnosis not present

## 2019-10-15 DIAGNOSIS — R42 Dizziness and giddiness: Secondary | ICD-10-CM | POA: Diagnosis not present

## 2019-10-15 DIAGNOSIS — C7951 Secondary malignant neoplasm of bone: Secondary | ICD-10-CM | POA: Diagnosis not present

## 2019-10-15 DIAGNOSIS — C7931 Secondary malignant neoplasm of brain: Secondary | ICD-10-CM | POA: Diagnosis not present

## 2019-10-15 DIAGNOSIS — I700 Atherosclerosis of aorta: Secondary | ICD-10-CM | POA: Diagnosis not present

## 2019-10-15 DIAGNOSIS — M549 Dorsalgia, unspecified: Secondary | ICD-10-CM | POA: Diagnosis not present

## 2019-10-15 DIAGNOSIS — C781 Secondary malignant neoplasm of mediastinum: Secondary | ICD-10-CM | POA: Diagnosis not present

## 2019-10-15 DIAGNOSIS — K219 Gastro-esophageal reflux disease without esophagitis: Secondary | ICD-10-CM | POA: Diagnosis not present

## 2019-10-15 DIAGNOSIS — R609 Edema, unspecified: Secondary | ICD-10-CM | POA: Diagnosis not present

## 2019-10-15 DIAGNOSIS — Z23 Encounter for immunization: Secondary | ICD-10-CM | POA: Diagnosis not present

## 2019-10-15 DIAGNOSIS — R29818 Other symptoms and signs involving the nervous system: Secondary | ICD-10-CM | POA: Diagnosis not present

## 2019-10-15 DIAGNOSIS — R2689 Other abnormalities of gait and mobility: Secondary | ICD-10-CM | POA: Diagnosis not present

## 2019-10-15 DIAGNOSIS — C3431 Malignant neoplasm of lower lobe, right bronchus or lung: Secondary | ICD-10-CM | POA: Diagnosis not present

## 2019-10-15 DIAGNOSIS — I119 Hypertensive heart disease without heart failure: Secondary | ICD-10-CM | POA: Diagnosis not present

## 2019-10-15 DIAGNOSIS — J90 Pleural effusion, not elsewhere classified: Secondary | ICD-10-CM | POA: Diagnosis not present

## 2019-10-15 DIAGNOSIS — R042 Hemoptysis: Secondary | ICD-10-CM | POA: Diagnosis not present

## 2019-10-15 MED ORDER — ALBUTEROL SULFATE HFA 108 (90 BASE) MCG/ACT IN AERS: 2.0000 | INHALATION_SPRAY | Freq: Four times a day (QID) | RESPIRATORY_TRACT | 0 refills | Status: DC | PRN

## 2019-10-15 NOTE — Telephone Encounter (Signed)
[name] [phone]  albuterol [facility]) 108 [phone] Base) MCG/ACT inhaler  [facility] [address], [place] Phone:  [phone]  Fax:  [phone]      Aveah called to say she needs refill on above medication sent to [facility], she is out. She now has insurance that started 10/12/2019, she said pharmacy has information.

## 2019-10-15 NOTE — Progress Notes (Signed)
[facility] [place], [place] [address] [phone]   New Patient Evaluation  Date of Service: 10/15/19 Patient Name: Virginia [name] Patient MRN: [id number] Patient DOB: Feb 04, 1956 Provider: Ventura Sellers, MD  Identifying Statement:  Virginia [name] is a 64 y.o. female with brain metastases who presents for initial consultation and evaluation regarding cancer associated neurologic deficits.    Referring Provider: Elby Showers, MD [address] [place],  [place] [address]  Primary Cancer: Lung adenocarcinoma, stage IV  Oncologic History:  History of Present Illness: The patient's records from the referring physician were obtained and reviewed and the patient interviewed to confirm this HPI.  Virginia [name] presents to clinic to discuss recent neurologic symptoms and imaging findings.  She describes a fall while climbing stairs on 1/20; she hit the back of her and lost consciousness.  She is unclear whether she lost her balance or had mechanical provocation, but she had been drinking that evening.  She was treated initially as a trauma, CT demonstrated a lesion in the posterior fossa, later confirmed as a metastasis by MRI, along with 7 additional smaller lesions.  It took her almost two weeks to return for cognition and balance to return to "normal" which she feels she is about at right now.  Baseline she is high functioning and independent, works as a Neurosurgeon.  Has been taking decadron 2mg  twice per day.  Plans to initiate chemotherapy soon with Dr. Lorenso Courier.  Medications: Current Outpatient Medications on File Prior to Visit  Medication Sig Dispense Refill  . acetaminophen (TYLENOL) 500 MG tablet Take 500-1,000 mg by mouth every 6 (six) hours as needed (for pain.).    Marland Kitchen ALPRAZolam (XANAX) 0.25 MG tablet TAKE 1 TABLET BY MOUTH TWICE DAILY AS NEEDED FOR ANXIETY (Patient taking differently:  Take 0.25 mg by mouth 2 (two) times daily as needed for anxiety. ) 60 tablet 5  . APPLE CIDER VINEGAR PO Take 450 mg by mouth daily.    Marland Kitchen aspirin 325 MG EC tablet Take 325 mg by mouth every 8 (eight) hours as needed for pain.    . Budeson-Glycopyrrol-Formoterol (BREZTRI AEROSPHERE) 160-9-4.8 MCG/ACT AERO Inhale 2 puffs into the lungs 2 (two) times daily.    . Carboxymethylcellul-Glycerin (CLEAR EYES FOR DRY EYES) 1-0.25 % SOLN Place 1 drop into both eyes 3 (three) times daily as needed.    Marland Kitchen dexamethasone (DECADRON) 2 MG tablet Take 1 tablet (2 mg total) by mouth 2 (two) times daily. 60 tablet 2  . ELDERBERRY PO Take 1,250 mg by mouth daily.    . hydrochlorothiazide (HYDRODIURIL) 25 MG tablet TAKE 1 TABLET(25 MG) BY MOUTH DAILY (Patient taking differently: Take 25 mg by mouth daily. ) 90 tablet 0  . ibuprofen (ADVIL,MOTRIN) 800 MG tablet take 1 tablet by mouth every 8 hours if needed for pain (Patient taking differently: Take 800 mg by mouth every 8 (eight) hours as needed (pain.). ) 90 tablet 1  . L-Lysine 500 MG TABS Take 500 mg by mouth 2 (two) times daily as needed (cold sores/fever blisters.).    Marland Kitchen losartan (COZAAR) 100 MG tablet TAKE 1 TABLET(100 MG) BY MOUTH DAILY (Patient taking differently: Take 100 mg by mouth daily. TAKE 1 TABLET(100 MG) BY MOUTH DAILY) 90 tablet 0  . ondansetron (ZOFRAN) 4 MG tablet Take 1 tablet (4 mg total) by mouth every 8 (eight) hours as needed for nausea or vomiting. [place]  tablet 0  . OVER THE COUNTER MEDICATION Apply 1 application topically daily as needed (shoulder pain.). [place] Gel OTC    . pantoprazole (PROTONIX) 40 MG tablet TAKE 1 TABLET(40 MG) BY MOUTH DAILY 90 tablet 3  . Probiotic Product (EQL PROBIOTIC COLON SUPPORT) CAPS Take 1 capsule by mouth daily.    . psyllium (METAMUCIL) 58.6 % powder Take 1 packet by mouth daily.    . rosuvastatin (CRESTOR) 5 MG tablet TAKE 1 TABLET BY MOUTH THREE TIMES WEEKLY AS DIRECTED (Patient taking differently: Take 5 mg by  mouth every Monday, Wednesday, and Friday. TAKE 1 TABLET BY MOUTH THREE TIMES WEEKLY AS DIRECTED) 36 tablet 1  . Vitamin D3 (VITAMIN D) 25 MCG tablet Take 2,000 Units by mouth daily.     No current facility-administered medications on file prior to visit.    Allergies: No Known Allergies Past Medical History:  Past Medical History:  Diagnosis Date  . Anxiety   . GERD (gastroesophageal reflux disease)   . Hypercholesterolemia    per pt, she does not have elevated lipids  . Hypertension   . Tobacco abuse    Past Surgical History:  Past Surgical History:  Procedure Laterality Date  . ABDOMINAL HYSTERECTOMY     partial/ left ovaries  . CHOLECYSTECTOMY    . DILATION AND CURETTAGE OF UTERUS    . LIPOMA EXCISION  2018   removed under left breast and right thigh.  . TUBAL LIGATION     Social History:  Social History   Socioeconomic History  . Marital status: Married    Spouse name: Not on file  . Number of children: 3  . Years of education: Not on file  . Highest education level: Not on file  Occupational History  . Occupation: owns Medical illustrator: [employer]  Tobacco Use  . Smoking status: Former Smoker    Packs/day: 0.25    Quit date: 10/31/2014    Years since quitting: 4.9  . Smokeless tobacco: Never Used  Substance and Sexual Activity  . Alcohol use: Yes    Alcohol/week: 0.0 standard drinks    Comment: rare  . Drug use: No  . Sexual activity: Yes  Other Topics Concern  . Not on file  Social History Narrative  . Not on file   Social Determinants of Health   Financial Resource Strain:   . Difficulty of Paying Living Expenses: Not on file  Food Insecurity:   . Worried About Charity fundraiser in the Last Year: Not on file  . Ran Out of Food in the Last Year: Not on file  Transportation Needs:   . Lack of Transportation (Medical): Not on file  . Lack of Transportation (Non-Medical): Not on file  Physical Activity:   . Days of Exercise per  Week: Not on file  . Minutes of Exercise per Session: Not on file  Stress:   . Feeling of Stress : Not on file  Social Connections:   . Frequency of Communication with Friends and Family: Not on file  . Frequency of Social Gatherings with Friends and Family: Not on file  . Attends Religious Services: Not on file  . Active Member of Clubs or Organizations: Not on file  . Attends Archivist Meetings: Not on file  . Marital Status: Not on file  Intimate Partner Violence:   . Fear of Current or Ex-Partner: Not on file  . Emotionally Abused: Not on file  . Physically  Abused: Not on file  . Sexually Abused: Not on file   Family History:  Family History  Problem Relation Age of Onset  . Heart disease Father   . Drug abuse Daughter   . Drug abuse Son   . Cancer Sister   . Heart disease Brother   . Heart attack Brother     Review of Systems: Constitutional: Doesn't report fevers, chills or abnormal weight loss Eyes: Doesn't report blurriness of vision Ears, nose, mouth, throat, and face: Doesn't report sore throat Respiratory: Doesn't report cough, dyspnea or wheezes Cardiovascular: Doesn't report palpitation, chest discomfort  Gastrointestinal:  Doesn't report nausea, constipation, diarrhea GU: Doesn't report incontinence Skin: Doesn't report skin rashes Neurological: Per HPI Musculoskeletal: Doesn't report joint pain Behavioral/Psych: Doesn't report anxiety  Physical Exam: Vitals:   10/15/19 1026  BP: 124/74  Pulse: 70  Resp: 18  Temp: 97.8 F (36.6 C)  SpO2: 98%   KPS: 90. General: Alert, cooperative, pleasant, in no acute distress Head: Normal EENT: No conjunctival injection or scleral icterus.  Lungs: Resp effort normal Cardiac: Regular rate Abdomen: Non-distended abdomen Skin: No rashes cyanosis or petechiae. Extremities: No clubbing or edema  Neurologic Exam: Mental Status: Awake, alert, attentive to examiner. Oriented to self and environment.  Language is fluent with intact comprehension.  Cranial Nerves: Visual acuity is grossly normal. Visual fields are full. Extra-ocular movements intact. No ptosis. Face is symmetric Motor: Tone and bulk are normal. Power is full in both arms and legs. Reflexes are symmetric, no pathologic reflexes present.  Sensory: Intact to light touch Gait: Moderate impairment to tandem gait only  Labs: I have reviewed the data as listed    Component Value Date/Time   NA 141 09/29/2019 0120   K 3.0 (L) 09/29/2019 0120   CL 103 09/29/2019 0120   CO2 22 09/29/2019 0120   GLUCOSE 163 (H) 09/29/2019 0120   BUN 11 09/29/2019 0120   CREATININE 0.64 09/29/2019 0120   CREATININE 0.58 11/15/2017 0948   CALCIUM 9.3 09/29/2019 0120   PROT 6.6 09/29/2019 0120   ALBUMIN 3.4 (L) 09/29/2019 0120   AST 22 09/29/2019 0120   ALT 24 09/29/2019 0120   ALKPHOS 88 09/29/2019 0120   BILITOT 0.3 09/29/2019 0120   GFRNONAA >60 09/29/2019 0120   GFRNONAA 100 11/15/2017 0948   GFRAA >60 09/29/2019 0120   GFRAA 116 11/15/2017 0948   Lab Results  Component Value Date   WBC 10.5 09/29/2019   NEUTROABS 5.8 09/29/2019   HGB 12.0 09/29/2019   HCT 36.7 09/29/2019   MCV 94.6 09/29/2019   PLT 392 09/29/2019    Imaging:  DG Chest 2 View  Result Date: 09/29/2019 CLINICAL DATA:  Screening. Fall and loss of consciousness. EXAM: CHEST - 2 VIEW COMPARISON:  Chest CT 09/24/2019 FINDINGS: Right infrahilar opacity corresponds to pulmonary mass on CT. Adjacent streaky opacities likely postobstructive atelectasis. Left lung is clear. Normal heart size. No pneumothorax or pleural fluid. No acute osseous abnormalities are seen. There are no radiopaque foreign bodies or medical device in the chest. Multiple overlying EKG leads. IMPRESSION: Right infrahilar opacity corresponds to pulmonary mass on CT. Adjacent streaky opacities likely postobstructive atelectasis. No radiopaque foreign body or metallic device to preclude MRI imaging.  Electronically Signed   By: Keith Rake M.D.   On: 09/29/2019 02:00   CT Head Wo Contrast  Result Date: 09/29/2019 CLINICAL DATA:  Neck trauma.  Pain status post fall. EXAM: CT HEAD WITHOUT CONTRAST CT CERVICAL SPINE WITHOUT  CONTRAST TECHNIQUE: Multidetector CT imaging of the head and cervical spine was performed following the standard protocol without intravenous contrast. Multiplanar CT image reconstructions of the cervical spine were also generated. COMPARISON:  None. FINDINGS: CT HEAD FINDINGS Brain: There is extensive vasogenic edema involving the left cerebellum there is suggestion of an underlying mass measuring approximately 1.8 cm. There is vasogenic edema in the right occipital lobe. There may be some vasogenic edema within the right frontal parietal lobe. There is no midline shift. No acute intracranial hemorrhage. There is some artifact involving the low anterior frontal lobe, less likely acute subarachnoid hemorrhage. Vascular: No hyperdense vessel or unexpected calcification. Skull: Normal. Negative for fracture or focal lesion.There is right posterior scalp swelling without evidence for an underlying fracture. Sinuses/Orbits: There is mild mucosal thickening of the maxillary sinuses bilaterally. The remaining paranasal sinuses and mastoid air cells are essentially clear. Other: None. CT CERVICAL SPINE FINDINGS Alignment: There is straightening of the normal cervical lordotic curvature. Skull base and vertebrae: No acute fracture. No primary bone lesion or focal pathologic process. Soft tissues and spinal canal: No prevertebral fluid or swelling. No visible canal hematoma. Disc levels: The disc heights are relatively well preserved throughout the cervical spine. Upper chest: Negative. Other: There is an enlarged left supraclavicular lymph node. Atherosclerotic changes are noted of the carotid arteries. IMPRESSION: 1. No acute intracranial hemorrhage. 2. No acute cervical spine fracture. 3.  Findings concerning for metastatic disease involving the left cerebellum and right occipital lobe as detailed above. Follow-up with a contrast-enhanced MRI is recommended. 4. Enlarged left supraclavicular lymph node. Electronically Signed   By: Constance Holster M.D.   On: 09/29/2019 01:46   CT Chest W Contrast  Result Date: 09/24/2019 CLINICAL DATA:  Persistent cough. Hemoptysis. Abnormal chest radiograph with right perihilar opacity. Former smoker per epic. EXAM: CT CHEST WITH CONTRAST TECHNIQUE: Multidetector CT imaging of the chest was performed during intravenous contrast administration. CONTRAST:  84mL OMNIPAQUE IOHEXOL 300 MG/ML  SOLN COMPARISON:  09/08/2019 chest radiograph. FINDINGS: Cardiovascular: Normal heart size. No significant pericardial effusion/thickening. Left anterior descending coronary atherosclerosis. Atherosclerotic nonaneurysmal thoracic aorta. Normal caliber pulmonary arteries. No central pulmonary emboli. Mediastinum/Nodes: No discrete thyroid nodules. Unremarkable esophagus. Enlarged 1.1 cm right prevascular node (series 3/image 61). Right paratracheal adenopathy up to 2.2 cm (series 3/image 72). Enlarged 2.5 cm subcarinal node (series 3/image 90). Enlarged 1.5 cm left paratracheal node (series 3/image 73). Right hilar adenopathy up to 1.4 cm (series 3/image 98). No pathologically enlarged left hilar nodes. No axillary adenopathy. Lungs/Pleura: No pneumothorax. Trace dependent right pleural effusion. No left pleural effusion. Poorly marginated cavitary 5.7 x 4.3 cm central right lower lobe lung mass (series 5/image 94) abutting and distorting the right major fissure, associated with high-grade stenosis of the central right lower lobe airways. Patchy consolidation and ground-glass opacity in the right lower lobe peripheral to the central lung mass compatible with postobstructive pneumonia. A few scattered small solid pulmonary nodules in both lungs, largest 6 mm in the peripheral  right middle lobe (series 5/image 97) and 3 mm in the left lower lobe (series 5/image 92). Upper abdomen: Cholecystectomy. Musculoskeletal: No aggressive appearing focal osseous lesions. Mild thoracic spondylosis. IMPRESSION: 1. Poorly marginated cavitary 5.7 cm central right lower lobe lung mass associated with high-grade stenosis of the central right lower lobe airways, suspicious for primary bronchogenic carcinoma. 2. Postobstructive pneumonia in the peripheral right lower lobe. 3. Right hilar, subcarinal, bilateral paratracheal and right prevascular mediastinal lymphadenopathy, suspicious for metastatic disease.  4. Several subcentimeter solid pulmonary nodules in both lungs, cannot exclude pulmonary metastases. 5. Trace dependent right pleural effusion. 6. Suggest staging PET-CT and multi disciplinary thoracic oncology consultation. Aortic Atherosclerosis (ICD10-I70.0). These results will be called to the ordering clinician or representative by the Radiology Department at the imaging location. Electronically Signed   By: Ilona Sorrel M.D.   On: 09/24/2019 12:50   CT Cervical Spine Wo Contrast  Result Date: 09/29/2019 CLINICAL DATA:  Neck trauma.  Pain status post fall. EXAM: CT HEAD WITHOUT CONTRAST CT CERVICAL SPINE WITHOUT CONTRAST TECHNIQUE: Multidetector CT imaging of the head and cervical spine was performed following the standard protocol without intravenous contrast. Multiplanar CT image reconstructions of the cervical spine were also generated. COMPARISON:  None. FINDINGS: CT HEAD FINDINGS Brain: There is extensive vasogenic edema involving the left cerebellum there is suggestion of an underlying mass measuring approximately 1.8 cm. There is vasogenic edema in the right occipital lobe. There may be some vasogenic edema within the right frontal parietal lobe. There is no midline shift. No acute intracranial hemorrhage. There is some artifact involving the low anterior frontal lobe, less likely acute  subarachnoid hemorrhage. Vascular: No hyperdense vessel or unexpected calcification. Skull: Normal. Negative for fracture or focal lesion.There is right posterior scalp swelling without evidence for an underlying fracture. Sinuses/Orbits: There is mild mucosal thickening of the maxillary sinuses bilaterally. The remaining paranasal sinuses and mastoid air cells are essentially clear. Other: None. CT CERVICAL SPINE FINDINGS Alignment: There is straightening of the normal cervical lordotic curvature. Skull base and vertebrae: No acute fracture. No primary bone lesion or focal pathologic process. Soft tissues and spinal canal: No prevertebral fluid or swelling. No visible canal hematoma. Disc levels: The disc heights are relatively well preserved throughout the cervical spine. Upper chest: Negative. Other: There is an enlarged left supraclavicular lymph node. Atherosclerotic changes are noted of the carotid arteries. IMPRESSION: 1. No acute intracranial hemorrhage. 2. No acute cervical spine fracture. 3. Findings concerning for metastatic disease involving the left cerebellum and right occipital lobe as detailed above. Follow-up with a contrast-enhanced MRI is recommended. 4. Enlarged left supraclavicular lymph node. Electronically Signed   By: Constance Holster M.D.   On: 09/29/2019 01:46   MR BRAIN W WO CONTRAST  Result Date: 09/30/2019 CLINICAL DATA:  Metastatic malignant neoplasm, unspecified site. Additional history provided: Patient with history of lung cancer EXAM: MRI HEAD WITHOUT AND WITH CONTRAST TECHNIQUE: Multiplanar, multiecho pulse sequences of the brain and surrounding structures were obtained without and with intravenous contrast. CONTRAST:  7.24mL GADAVIST GADOBUTROL 1 MMOL/ML IV SOLN COMPARISON:  Head CT 09/29/2019 FINDINGS: Brain: There are multiple enhancing intracranial metastases as follows. An index peripherally enhancing metastasis within the paramedian left cerebellum measures 1.6 x 1.3 x  1.5 cm (series 12, image 37). Moderate surrounding edema. 4 mm lesion within the right cerebellum (series 12, image 37). Mild surrounding edema. Punctate lesion within the midline inferior cerebellum (series 12, image 34). 5 mm peripherally enhancing lesion within the right occipital lobe (series 12, image 61). Mild surrounding edema. 2 mm lesion within the right parietal lobe (series 12, image 86). Minimal associated edema. 3 mm peripherally enhancing lesion within right parietal lobe (series 12, image 101). Minimal associated edema. Punctate lesion within the left precentral gyrus (series 12, image 118). Mild surrounding edema. 3 mm lesion within the anterior left frontal lobe (series 12, image 121). An additional 2 mm peripherally enhancing metastasis is questioned within the quadrigeminal plate cistern along the  internal cerebral veins (series 12, image 75), although this is not definite. Posterior fossa mass effect related to the moderate edema associated with the dominant left cerebellar metastasis. Partial effacement of the fourth ventricle. No evidence of hydrocephalus on the current examination. No evidence of acute infarct. No supratentorial midline shift. No extra-axial fluid collection. No chronic intracranial blood products. Additional mild scattered T2/FLAIR hyperintensity within the cerebral white matter is nonspecific, but consistent with chronic small vessel ischemic disease. Cerebral volume is normal for age. Vascular: Flow voids maintained within the proximal large arterial vessels. 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 3 mm enhancing focus within right occipital calvarium which appears to demonstrate precontrast T1 hyperintensity and is likely benign. Sinuses/Orbits: Visualized orbits demonstrate no acute  abnormality. Left maxillary sinus mucous retention cyst. Minimal ethmoid sinus mucosal thickening. No significant mastoid effusion. These results will be called to the ordering clinician or representative by the Radiologist Assistant, and communication documented in the PACS or zVision Dashboard. IMPRESSION: There are least 8 metastatic lesions within the supratentorial and infratentorial brain as outlined. An index peripherally enhancing mass within the left cerebellum measures 1.6 x 1.3 x 1.5 cm. Moderate surrounding edema. Resultant posterior fossa mass effect with partial effacement of the fourth ventricle. No evidence of hydrocephalus. 10 mm focus of enhancement within the left parietal calvarium with adjacent focal dural thickening and enhancement. 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 Mild chronic small vessel ischemic disease. Electronically Signed   By: Kellie Simmering DO   On: 09/30/2019 16:04   NM PET Image Initial (PI) Skull Base To Thigh  Result Date: 10/01/2019 CLINICAL DATA:  Initial treatment strategy for pulmonary mass. EXAM: NUCLEAR MEDICINE PET SKULL BASE TO THIGH TECHNIQUE: 8.1 mCi F-18 FDG was injected intravenously. Full-ring PET imaging was performed from the skull base to thigh after the radiotracer. CT data was obtained and used for attenuation correction and anatomic localization. Fasting blood glucose: 125 mg/dl COMPARISON:  Chest CT 09/24/2019 FINDINGS: Mediastinal blood pool activity: SUV max 2.7 Liver activity: SUV max NA NECK: Lower left cervical node is hypermetabolic with SUV max = 7.5. Incidental CT findings: none CHEST: Hypermetabolic lymphadenopathy is identified in the right axilla, right thoracic inlet, mediastinum, and right hilum. Index right paratracheal node on 78/3 measures 2.6 cm short axis with SUV max = 10.5. Index subcarinal lymph node on 89/3 measures 2.3 cm with SUV max = 9.8. Left hilar lymphadenopathy is hypermetabolic with SUV max  = 9.1. Right infrahilar mass lesion better characterized on recent diagnostic chest CT is hypermetabolic with SUV max = 37.1. 9 mm posterior right lower lobe nodule on 87/3 shows no discernible hypermetabolism on PET imaging. Incidental CT findings: Heart is enlarged. Trace pericardial effusion. Coronary artery calcification is evident. Atherosclerotic calcification is noted in the wall of the thoracic aorta. Please see report for recent diagnostic CT of the chest. ABDOMEN/PELVIS: No abnormal hypermetabolic activity within the liver, pancreas, adrenal glands, or spleen. No hypermetabolic lymph nodes in the abdomen or pelvis. Incidental CT findings: Gallbladder is surgically absent.There is abdominal aortic atherosclerosis without aneurysm. SKELETON: Scattered hypermetabolic bone metastases are identified including posterior right second rib ( SUV max = 4.6), left iliac crest (SUV max = 4.6), and anterior right iliac bone ( SUV max = 5.5). Hypermetabolic subcutaneous nodules are identified. 5 mm subcutaneous nodule  lateral left abdominal wall (147/3) demonstrates SUV max = 1.9. 5 mm subcutaneous nodule superficial to the right rectus sheath on image 194/3 demonstrates SUV max = 2.3. 7 mm subcutaneous nodule lateral right hip on 246/3 demonstrates SUV max = 3.4. Incidental CT findings: Above described hypermetabolic bone lesion show no CT correlate. IMPRESSION: 1. Markedly hypermetabolic infrahilar right lower lobe mass with hypermetabolic nodal metastases in the right hilum, mediastinum, right supraclavicular region, left axilla and lower left neck. 2. Hypermetabolic rib, vertebral body, and iliac bone lesions consistent with metastatic involvement. 3. Tiny hypermetabolic subcutaneous nodules also concerning for metastatic disease. 4.  Aortic Atherosclerois (ICD10-170.0) Electronically Signed   By: Misty Stanley M.D.   On: 10/01/2019 15:31   [name] CORE BIOPSY (LYMPH NODES)  Result Date: 10/08/2019 INDICATION:  64 year old with right lung mass and lymphadenopathy. Tissue diagnosis is needed. Patient has a hypermetabolic left cervical lymph node on recent PET-CT. EXAM: ULTRASOUND-GUIDED CORE BIOPSY OF LEFT CERVICAL LYMPH NODE MEDICATIONS: None. ANESTHESIA/SEDATION: None FLUOROSCOPY TIME:  None COMPLICATIONS: None immediate. PROCEDURE: The procedure was explained to the patient. The risks and benefits of the procedure were discussed and the patient's questions were addressed. Informed consent was obtained from the patient. The left side of the neck was evaluated with ultrasound. Suspicious hypoechoic lymph node was identified just lateral to the left common carotid artery. Skin was prepped with chlorhexidine and sterile field was created. Skin and soft tissues were anesthetized with 1% lidocaine. Using ultrasound guidance, 18 gauge core device was directed into the lymph node. Total of 4 core biopsies were obtained. Specimens placed in saline. Bandage placed over the puncture site. FINDINGS: Suspicious hypoechoic lymph node just posterior to the left internal jugular vein and lateral to the left common carotid artery. Lymph node measures 1.0 cm in the short axis. This lymph node corresponds with the hypermetabolic lymph node on recent PET-CT. Four adequate core biopsies were obtained. No significant bleeding or hematoma formation following the core biopsies. IMPRESSION: Ultrasound-guided core biopsies of the suspicious left cervical lymph node. Electronically Signed   By: Markus Daft M.D.   On: 10/08/2019 14:37     Assessment/Plan Brain metastases ([place])  Ms. Bradner presents with clinical syndrome localizing to posterior fossa and whole brain, consistent with overalapping etiologies of brain metastases with cerebral edema, and CNS trauma/concussion.  Rest and dexamethasone have addressed or corrected most of the clinical deficits residual from these processes; at this time she exhibits very minor imbalance only.  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  We will arrange consultation with radiation oncology and discuss her case in brain/spine tumor board next week.  She was agreeable to proceed with determined course of radiation, likely radiosurgery or FSRT.  We recommended deceasing decadron to 2mg  daily as long as she is mostly asymptomatic.  She will call us if balance declines.  We will reach out to her in 10-14 days for further titration of decadron.  We spent twenty additional minutes teaching regarding the natural history, biology, and historical experience in the treatment of neurologic complications of cancer.   We appreciate the opportunity to participate in the care of DERITA MICHELSEN.  Radiation team will reach out to her once plans are identified.  We will follow up with her next in 3 months after post RT MRI study.  All questions were answered. The patient knows to call the clinic with any problems, questions or  concerns. No barriers to learning were detected.  The total time spent in the encounter was 40 minutes and more than 50% was on counseling and review of test results   Ventura Sellers, MD Medical Director of Neuro-Oncology [facility] [place] 10/15/19 4:51 PM

## 2019-10-15 NOTE — Telephone Encounter (Signed)
No los per 2/4. 

## 2019-10-16 ENCOUNTER — Telehealth: Payer: Self-pay | Admitting: *Deleted

## 2019-10-16 ENCOUNTER — Telehealth: Payer: Self-pay | Admitting: Internal Medicine

## 2019-10-16 ENCOUNTER — Telehealth: Payer: Self-pay | Admitting: Hematology and Oncology

## 2019-10-16 MED ORDER — OXYCODONE HCL 5 MG PO TABS: 5.0000 mg | ORAL_TABLET | Freq: Four times a day (QID) | ORAL | 0 refills | Status: DC | PRN

## 2019-10-16 MED ORDER — CYCLOBENZAPRINE HCL 10 MG PO TABS: 10.0000 mg | ORAL_TABLET | Freq: Three times a day (TID) | ORAL | 0 refills | Status: DC | PRN

## 2019-10-16 NOTE — Telephone Encounter (Signed)
Called Mr. Guadron to discuss his wife's symptoms. She is having increased back pain and reported 10/10 pain last night with some improvement to 5/10 today with Flexeril and ibuprofen 800mg .   The patient only has 1 tablet of Flexeril left. Will call in an additional supply with some oxycodone 5-10mg  for breakthrough pain. Recommend using heating pad to help relax the muscle. Instructed them to call if pain worsens or fails to improve. Can consider the addition of gabapentin. Avoid tramadol due to concern for seizure activity as noted by neuro-oncology.  Ledell Peoples, MD Department of Hematology/Oncology [place] at [facility] Phone: [phone] Pager: [phone] Email: [email or web address]

## 2019-10-16 NOTE — Telephone Encounter (Signed)
Received call from pt's husband, [name] stating that pt was in yest to see Dr Mickeal Skinner & he thinks they discussed getting a muscle relaxer for Netty's back.  Pt has had some mid back pain since Sat or Sun that has progressively gotten worse.  She didn't sleep much last hs due to pain & he states she cried a couple of times with pain.  He has massaged her back & feels soft tissue @ size of lemon/lime to the left of her spine just below center of back.  He thinks that it may be a Lipoma from [employer]. She had 3 Flexeril & took one last night but it didn't help much.  She took another this am with a little relief.  She describes a constant pain, # 10 last night & # 5 presently. She has also taken ibuprofen 800 mg.  She was taking tylenol but switched to ibuprofen & was told she could take 4 x/daily.  Message to Dr Vaslow/Dorsey/Pod RN for suggestions.

## 2019-10-16 NOTE — Telephone Encounter (Signed)
Called Virginia [name] to let him know they need to contact Oncology, since she is now under their care. He verbalized understanding.

## 2019-10-16 NOTE — Telephone Encounter (Signed)
Evvie Behrmann [phone]   [name] called to say that Virginia [name] is having left middle back pain, that is keeping her up at night. Last night she was crying in pain, can not get comfortable. He would like to bring her to see you today.

## 2019-10-16 NOTE — Telephone Encounter (Signed)
She is now under oncology care and he needs to contact them

## 2019-10-19 ENCOUNTER — Other Ambulatory Visit: Payer: Self-pay | Admitting: *Deleted

## 2019-10-19 ENCOUNTER — Telehealth: Payer: Self-pay | Admitting: *Deleted

## 2019-10-19 ENCOUNTER — Other Ambulatory Visit: Payer: Self-pay | Admitting: Hematology and Oncology

## 2019-10-19 DIAGNOSIS — C3491 Malignant neoplasm of unspecified part of right bronchus or lung: Secondary | ICD-10-CM

## 2019-10-19 NOTE — Telephone Encounter (Signed)
Oncology Nurse Navigator Documentation  Oncology Nurse Navigator Flowsheets 10/19/2019  Navigator Location [facility]  Referral Date to RadOnc/MedOnc -  Navigator Encounter Type Telephone/I called to update Virginia [name] on my role and to see how I can support her. I was unable to reach but did leave a vm message with my name and phone number.   Telephone Outgoing Call  Treatment Phase Pre-Tx/Tx Discussion  Barriers/Navigation Needs Education  Education Other  Interventions Education  Acuity Level 2-Minimal Needs (1-2 Barriers Identified)  Coordination of Care -  Education Method Verbal  Time Spent with Patient 15

## 2019-10-19 NOTE — Telephone Encounter (Signed)
TCT patient's husband, [name] with appt for MRI of her thoracic and lumbar spine. She is to have her MRI @ [facility] tomorrow, 10/20/19 @ 11:30 am. Advised that [facility] was booked for the week. [name] voiced understanding.  No further questions or concerns

## 2019-10-19 NOTE — Telephone Encounter (Signed)
Received call from pt's husband stating that Virginia [name] experienced increased mid-lower left side back pain over the weekend. He states it worsedned even since Friday when he spoke with Dr. Lorenso Courier. Pt has been taking Ibuprofen 800 mg every 6 hours per advise from Dr. Mickeal Skinner when she was seen with him last week. She did not start taking the Oxycodone 5 mg prescribed per Dr. Lorenso Courier until 3 am this am. She took it again @ 9:20 am.  She is also taking Flexeril 10mg  every 8 hours as prescribed.. Husband states she has been staying in bed most of the weekend.  States she is able to walk unassisted around the house several times a day w/o pain. Encouraged husband to have her not stay in bed but to be up about as tolerated and then to be in their recliner with her feet up as tolerated.  Encouraged use of heat. Husband states she has a 'lump' the size of a lime on the left side of her mid back, this is near the area of pain. Discussed the above with Dr. Lorenso Courier. No med changes but he ordered a MRI of her spine to be done asap. High priority message sent to Gaspar Bidding for PA  Husband are of the above. Advised not to go to chiropractor as he had asked about that as well. Pt has too many medical issues related to her cancer to go to chiropractor.  He voiced understanding to the above.

## 2019-10-20 ENCOUNTER — Ambulatory Visit
Admission: RE | Admit: 2019-10-20 | Discharge: 2019-10-20 | Disposition: A | Discharge status: Home / Self Care | Payer: BC Managed Care – PPO | Source: Ambulatory Visit | Attending: Hematology and Oncology | Admitting: Hematology and Oncology

## 2019-10-20 ENCOUNTER — Other Ambulatory Visit: Payer: Self-pay

## 2019-10-20 DIAGNOSIS — C3491 Malignant neoplasm of unspecified part of right bronchus or lung: Secondary | ICD-10-CM

## 2019-10-20 DIAGNOSIS — C7951 Secondary malignant neoplasm of bone: Secondary | ICD-10-CM | POA: Diagnosis not present

## 2019-10-20 MED ORDER — GADOBUTROL 1 MMOL/ML IV SOLN
6.0000 mL | Freq: Once | INTRAVENOUS | Status: AC | PRN
  Administered 2019-10-20: 6 mL via INTRAVENOUS

## 2019-10-20 NOTE — Progress Notes (Signed)
Radiation Oncology         [phone] ________________________________  Initial outpatient Consultation by MyChart video during pandemic precautions  Name: Virginia [name] MRN: [id number]  Date: 10/21/2019  DOB: August 27, 1956  CC:Virginia Showers, MD  Virginia Sellers, MD   REFERRING PHYSICIAN: Ventura Sellers, MD  DIAGNOSIS:    ICD-10-CM   1. Brain metastases ([place])  C79.31   2. Bone metastases ([place])  C79.51     HISTORY OF PRESENT ILLNESS::Virginia [name] is a 64 y.o. female who presented to the ED on 09/29/2019 following a fall down the stairs. She underwent head CT at that time that revealed findings concerning for metastatic disease involving the left cerebellum and right occipital lobe. She sustained a concussion.   MRI of the brain on 09/30/2019 demonstrated a 1.6 cm mass within the left cerebellum, as well as at least 8 metastatic lesions within the supratentorial and infratentorial brain.  PET scan was performed on 10/01/2019 and revealed a hypermetabolic infrahilar right lower lobe mass with hypermetabolic nodal metastases in the right hilum, mediastinum, right supraclavicular region, left axilla, and lower left neck.  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  She was seen by Dr. Lorenso Courier on 10/12/2019. The plan is to begin systemic chemotherapy consisting of carboplatin pemetrexed and pembrolizumab on 10/28/2019.  She was also seen by Dr. Mickeal Skinner on 10/15/2019. She will be tapered of her decadron as she is mostly asymptomatic.   Since these visits, she developed worsening back pain. Yesterday, she underwent thoracic and lumbar spine MRI, which revealed: multiple metastatic lesions in the thoracic and lumbar spine as well as right iliac bone; tumor extends into left side of spinal canal and into left neural foramina at T6-7, T7-8, and T8-9 with a slight mass effect upon the  thecal sac at those levels; large mass in right lower lobe with mediastinal lymphadenopathy.  There is a soft tissue mass in the left paraspinal region of unclear etiology  A year ago she developed left back pain, right below bra strap, had been affecting sleep. Over the past week, past has gotten worse with soft tissue swelling in that area.  Not radiation over rib cage. Walking fine. She also have noted soft tissue swelling above this area, also on the left, above bra strap, but that area isn't hurting.   No lumbar pain.   ++ constipation -- Will use Miralax.  No incontinence.  No HAs currently. Taking Dexamethasone. Not on antiseizure meds.   A little trouble forming words, but that has improved since recuperating from concussion. Cognitively otherwise at baseline per husband and patient. Dizziness with bending over or turning head quickly.  PREVIOUS RADIATION THERAPY: No  PAST MEDICAL HISTORY:  has a past medical history of Anxiety, GERD (gastroesophageal reflux disease), Hypercholesterolemia, Hypertension, and Tobacco abuse.    PAST SURGICAL HISTORY: Past Surgical History:  Procedure Laterality Date  . ABDOMINAL HYSTERECTOMY     partial/ left ovaries  . CHOLECYSTECTOMY    . DILATION AND CURETTAGE OF UTERUS    . LIPOMA EXCISION  2018   removed under left breast and right thigh.  . TUBAL LIGATION      FAMILY HISTORY: family history includes Cancer in her sister; Drug abuse in her daughter and son; Heart attack in her brother; Heart disease in her brother and father.  SOCIAL HISTORY:  reports that she quit smoking about 6 years ago.  She smoked 0.25 packs per day. She has never used smokeless tobacco. She reports current alcohol use. She reports that she does not use drugs.  ALLERGIES: Patient has no known allergies.  MEDICATIONS:  Current Outpatient Medications  Medication Sig Dispense Refill  . acetaminophen (TYLENOL) 500 MG tablet Take 500-1,000 mg by mouth every 6 (six) hours as  needed (for pain.).    Marland Kitchen albuterol (VENTOLIN HFA) 108 (90 Base) MCG/ACT inhaler Inhale 2 puffs into the lungs every 6 (six) hours as needed for wheezing or shortness of breath. 8 g 0  . APPLE CIDER VINEGAR PO Take 450 mg by mouth daily.    . Budeson-Glycopyrrol-Formoterol (BREZTRI AEROSPHERE) 160-9-4.8 MCG/ACT AERO Inhale 2 puffs into the lungs 2 (two) times daily.    . Carboxymethylcellul-Glycerin (CLEAR EYES FOR DRY EYES) 1-0.25 % SOLN Place 1 drop into both eyes 3 (three) times daily as needed.    . cyclobenzaprine (FLEXERIL) 10 MG tablet Take 1 tablet (10 mg total) by mouth 3 (three) times daily as needed for muscle spasms. 30 tablet 0  . dexamethasone (DECADRON) 2 MG tablet Take 1 tablet (2 mg total) by mouth 2 (two) times daily. (Patient taking differently: Take 2 mg by mouth daily. ) 60 tablet 2  . ELDERBERRY PO Take 1,250 mg by mouth daily.    . hydrochlorothiazide (HYDRODIURIL) 25 MG tablet TAKE 1 TABLET(25 MG) BY MOUTH DAILY (Patient taking differently: Take 25 mg by mouth daily. ) 90 tablet 0  . ibuprofen (ADVIL,MOTRIN) 800 MG tablet take 1 tablet by mouth every 8 hours if needed for pain (Patient taking differently: Take 800 mg by mouth every 8 (eight) hours as needed (pain.). ) 90 tablet 1  . L-Lysine 500 MG TABS Take 500 mg by mouth 2 (two) times daily as needed (cold sores/fever blisters.).    Marland Kitchen losartan (COZAAR) 100 MG tablet TAKE 1 TABLET(100 MG) BY MOUTH DAILY (Patient taking differently: Take 100 mg by mouth daily. TAKE 1 TABLET(100 MG) BY MOUTH DAILY) 90 tablet 0  . OVER THE COUNTER MEDICATION Apply 1 application topically daily as needed (shoulder pain.). [place] Gel OTC    . oxyCODONE (OXY IR/ROXICODONE) 5 MG immediate release tablet Take 1-2 tablets (5-10 mg total) by mouth every 6 (six) hours as needed for severe pain or breakthrough pain. 30 tablet 0  . pantoprazole (PROTONIX) 40 MG tablet TAKE 1 TABLET(40 MG) BY MOUTH DAILY 90 tablet 3  . polyethylene glycol (MIRALAX /  GLYCOLAX) 17 g packet Take 17 g by mouth daily.    . Probiotic Product (EQL PROBIOTIC COLON SUPPORT) CAPS Take 1 capsule by mouth daily.    . psyllium (METAMUCIL) 58.6 % powder Take 1 packet by mouth daily.    . rosuvastatin (CRESTOR) 5 MG tablet TAKE 1 TABLET BY MOUTH THREE TIMES WEEKLY AS DIRECTED (Patient taking differently: Take 5 mg by mouth every Monday, Wednesday, and Friday. TAKE 1 TABLET BY MOUTH THREE TIMES WEEKLY AS DIRECTED) 36 tablet 1  . Vitamin D3 (VITAMIN D) 25 MCG tablet Take 2,000 Units by mouth daily.    Marland Kitchen ALPRAZolam (XANAX) 0.25 MG tablet TAKE 1 TABLET BY MOUTH TWICE DAILY AS NEEDED FOR ANXIETY (Patient not taking: No sig reported) 60 tablet 5  . aspirin 325 MG EC tablet Take 325 mg by mouth every 8 (eight) hours as needed for pain.    Marland Kitchen ondansetron (ZOFRAN) 4 MG tablet Take 1 tablet (4 mg total) by mouth every 8 (eight) hours as needed for nausea  or vomiting. (Patient not taking: Reported on 10/21/2019) 20 tablet 0   No current facility-administered medications for this encounter.    REVIEW OF SYSTEMS:  As above    PHYSICAL EXAM:  vitals were not taken for this visit.   Alert and oriented and in no acute distress.  Provides a good history.  Sitting upright.  No slurring of speech. Object recall (ball, hat, cheese ) 3/3 at 3 min  KPS = 80 100 - Normal; no complaints; no evidence of disease. 90   - Able to carry on normal activity; minor signs or symptoms of disease. 80   - Normal activity with effort; some signs or symptoms of disease. 2   - Cares for self; unable to carry on normal activity or to do active work. 60   - Requires occasional assistance, but is able to care for most of his personal needs. 50   - Requires considerable assistance and frequent medical care. 57   - Disabled; requires special care and assistance. 71   - Severely disabled; hospital admission is indicated although death not imminent. 39   - Very sick; hospital admission necessary; active  supportive treatment necessary. 10   - Moribund; fatal processes progressing rapidly. 0     - Dead  Karnofsky DA, Abelmann [place], Craver LS and [place] [phone]) The use of the nitrogen mustards in the palliative treatment of carcinoma: with particular reference to bronchogenic carcinoma Cancer 1 [id number]  LABORATORY DATA:  Lab Results  Component Value Date   WBC 10.5 09/29/2019   HGB 12.0 09/29/2019   HCT 36.7 09/29/2019   MCV 94.6 09/29/2019   PLT 392 09/29/2019   CMP     Component Value Date/Time   NA 141 09/29/2019 0120   K 3.0 (L) 09/29/2019 0120   CL 103 09/29/2019 0120   CO2 22 09/29/2019 0120   GLUCOSE 163 (H) 09/29/2019 0120   BUN 11 09/29/2019 0120   CREATININE 0.64 09/29/2019 0120   CREATININE 0.58 11/15/2017 0948   CALCIUM 9.3 09/29/2019 0120   PROT 6.6 09/29/2019 0120   ALBUMIN 3.4 (L) 09/29/2019 0120   AST 22 09/29/2019 0120   ALT 24 09/29/2019 0120   ALKPHOS 88 09/29/2019 0120   BILITOT 0.3 09/29/2019 0120   GFRNONAA >60 09/29/2019 0120   GFRNONAA 100 11/15/2017 0948   GFRAA >60 09/29/2019 0120   GFRAA 116 11/15/2017 0948         RADIOGRAPHY: DG Chest 2 View  Result Date: 09/29/2019 CLINICAL DATA:  Screening. Fall and loss of consciousness. EXAM: CHEST - 2 VIEW COMPARISON:  Chest CT 09/24/2019 FINDINGS: Right infrahilar opacity corresponds to pulmonary mass on CT. Adjacent streaky opacities likely postobstructive atelectasis. Left lung is clear. Normal heart size. No pneumothorax or pleural fluid. No acute osseous abnormalities are seen. There are no radiopaque foreign bodies or medical device in the chest. Multiple overlying EKG leads. IMPRESSION: Right infrahilar opacity corresponds to pulmonary mass on CT. Adjacent streaky opacities likely postobstructive atelectasis. No radiopaque foreign body or metallic device to preclude MRI imaging. Electronically Signed   By: Keith Rake M.D.   On: 09/29/2019 02:00   CT Head Wo Contrast  Result Date:  09/29/2019 CLINICAL DATA:  Neck trauma.  Pain status post fall. EXAM: CT HEAD WITHOUT CONTRAST CT CERVICAL SPINE WITHOUT CONTRAST TECHNIQUE: Multidetector CT imaging of the head and cervical spine was performed following the standard protocol without intravenous contrast. Multiplanar CT image reconstructions of the cervical spine were also  generated. COMPARISON:  None. FINDINGS: CT HEAD FINDINGS Brain: There is extensive vasogenic edema involving the left cerebellum there is suggestion of an underlying mass measuring approximately 1.8 cm. There is vasogenic edema in the right occipital lobe. There may be some vasogenic edema within the right frontal parietal lobe. There is no midline shift. No acute intracranial hemorrhage. There is some artifact involving the low anterior frontal lobe, less likely acute subarachnoid hemorrhage. Vascular: No hyperdense vessel or unexpected calcification. Skull: Normal. Negative for fracture or focal lesion.There is right posterior scalp swelling without evidence for an underlying fracture. Sinuses/Orbits: There is mild mucosal thickening of the maxillary sinuses bilaterally. The remaining paranasal sinuses and mastoid air cells are essentially clear. Other: None. CT CERVICAL SPINE FINDINGS Alignment: There is straightening of the normal cervical lordotic curvature. Skull base and vertebrae: No acute fracture. No primary bone lesion or focal pathologic process. Soft tissues and spinal canal: No prevertebral fluid or swelling. No visible canal hematoma. Disc levels: The disc heights are relatively well preserved throughout the cervical spine. Upper chest: Negative. Other: There is an enlarged left supraclavicular lymph node. Atherosclerotic changes are noted of the carotid arteries. IMPRESSION: 1. No acute intracranial hemorrhage. 2. No acute cervical spine fracture. 3. Findings concerning for metastatic disease involving the left cerebellum and right occipital lobe as detailed above.  Follow-up with a contrast-enhanced MRI is recommended. 4. Enlarged left supraclavicular lymph node. Electronically Signed   By: Constance Holster M.D.   On: 09/29/2019 01:46   CT Chest W Contrast  Result Date: 09/24/2019 CLINICAL DATA:  Persistent cough. Hemoptysis. Abnormal chest radiograph with right perihilar opacity. Former smoker per epic. EXAM: CT CHEST WITH CONTRAST TECHNIQUE: Multidetector CT imaging of the chest was performed during intravenous contrast administration. CONTRAST:  32mL OMNIPAQUE IOHEXOL 300 MG/ML  SOLN COMPARISON:  09/08/2019 chest radiograph. FINDINGS: Cardiovascular: Normal heart size. No significant pericardial effusion/thickening. Left anterior descending coronary atherosclerosis. Atherosclerotic nonaneurysmal thoracic aorta. Normal caliber pulmonary arteries. No central pulmonary emboli. Mediastinum/Nodes: No discrete thyroid nodules. Unremarkable esophagus. Enlarged 1.1 cm right prevascular node (series 3/image 61). Right paratracheal adenopathy up to 2.2 cm (series 3/image 72). Enlarged 2.5 cm subcarinal node (series 3/image 90). Enlarged 1.5 cm left paratracheal node (series 3/image 73). Right hilar adenopathy up to 1.4 cm (series 3/image 98). No pathologically enlarged left hilar nodes. No axillary adenopathy. Lungs/Pleura: No pneumothorax. Trace dependent right pleural effusion. No left pleural effusion. Poorly marginated cavitary 5.7 x 4.3 cm central right lower lobe lung mass (series 5/image 94) abutting and distorting the right major fissure, associated with high-grade stenosis of the central right lower lobe airways. Patchy consolidation and ground-glass opacity in the right lower lobe peripheral to the central lung mass compatible with postobstructive pneumonia. A few scattered small solid pulmonary nodules in both lungs, largest 6 mm in the peripheral right middle lobe (series 5/image 97) and 3 mm in the left lower lobe (series 5/image 92). Upper abdomen:  Cholecystectomy. Musculoskeletal: No aggressive appearing focal osseous lesions. Mild thoracic spondylosis. IMPRESSION: 1. Poorly marginated cavitary 5.7 cm central right lower lobe lung mass associated with high-grade stenosis of the central right lower lobe airways, suspicious for primary bronchogenic carcinoma. 2. Postobstructive pneumonia in the peripheral right lower lobe. 3. Right hilar, subcarinal, bilateral paratracheal and right prevascular mediastinal lymphadenopathy, suspicious for metastatic disease. 4. Several subcentimeter solid pulmonary nodules in both lungs, cannot exclude pulmonary metastases. 5. Trace dependent right pleural effusion. 6. Suggest staging PET-CT and multi disciplinary thoracic oncology consultation. Aortic  Atherosclerosis (ICD10-I70.0). These results will be called to the ordering clinician or representative by the Radiology Department at the imaging location. Electronically Signed   By: Ilona Sorrel M.D.   On: 09/24/2019 12:50   CT Cervical Spine Wo Contrast  Result Date: 09/29/2019 CLINICAL DATA:  Neck trauma.  Pain status post fall. EXAM: CT HEAD WITHOUT CONTRAST CT CERVICAL SPINE WITHOUT CONTRAST TECHNIQUE: Multidetector CT imaging of the head and cervical spine was performed following the standard protocol without intravenous contrast. Multiplanar CT image reconstructions of the cervical spine were also generated. COMPARISON:  None. FINDINGS: CT HEAD FINDINGS Brain: There is extensive vasogenic edema involving the left cerebellum there is suggestion of an underlying mass measuring approximately 1.8 cm. There is vasogenic edema in the right occipital lobe. There may be some vasogenic edema within the right frontal parietal lobe. There is no midline shift. No acute intracranial hemorrhage. There is some artifact involving the low anterior frontal lobe, less likely acute subarachnoid hemorrhage. Vascular: No hyperdense vessel or unexpected calcification. Skull: Normal.  Negative for fracture or focal lesion.There is right posterior scalp swelling without evidence for an underlying fracture. Sinuses/Orbits: There is mild mucosal thickening of the maxillary sinuses bilaterally. The remaining paranasal sinuses and mastoid air cells are essentially clear. Other: None. CT CERVICAL SPINE FINDINGS Alignment: There is straightening of the normal cervical lordotic curvature. Skull base and vertebrae: No acute fracture. No primary bone lesion or focal pathologic process. Soft tissues and spinal canal: No prevertebral fluid or swelling. No visible canal hematoma. Disc levels: The disc heights are relatively well preserved throughout the cervical spine. Upper chest: Negative. Other: There is an enlarged left supraclavicular lymph node. Atherosclerotic changes are noted of the carotid arteries. IMPRESSION: 1. No acute intracranial hemorrhage. 2. No acute cervical spine fracture. 3. Findings concerning for metastatic disease involving the left cerebellum and right occipital lobe as detailed above. Follow-up with a contrast-enhanced MRI is recommended. 4. Enlarged left supraclavicular lymph node. Electronically Signed   By: Constance Holster M.D.   On: 09/29/2019 01:46   MR BRAIN W WO CONTRAST  Result Date: 09/30/2019 CLINICAL DATA:  Metastatic malignant neoplasm, unspecified site. Additional history provided: Patient with history of lung cancer EXAM: MRI HEAD WITHOUT AND WITH CONTRAST TECHNIQUE: Multiplanar, multiecho pulse sequences of the brain and surrounding structures were obtained without and with intravenous contrast. CONTRAST:  7.55mL GADAVIST GADOBUTROL 1 MMOL/ML IV SOLN COMPARISON:  Head CT 09/29/2019 FINDINGS: Brain: There are multiple enhancing intracranial metastases as follows. An index peripherally enhancing metastasis within the paramedian left cerebellum measures 1.6 x 1.3 x 1.5 cm (series 12, image 37). Moderate surrounding edema. 4 mm lesion within the right cerebellum  (series 12, image 37). Mild surrounding edema. Punctate lesion within the midline inferior cerebellum (series 12, image 34). 5 mm peripherally enhancing lesion within the right occipital lobe (series 12, image 61). Mild surrounding edema. 2 mm lesion within the right parietal lobe (series 12, image 86). Minimal associated edema. 3 mm peripherally enhancing lesion within right parietal lobe (series 12, image 101). Minimal associated edema. Punctate lesion within the left precentral gyrus (series 12, image 118). Mild surrounding edema. 3 mm lesion within the anterior left frontal lobe (series 12, image 121). An additional 2 mm peripherally enhancing metastasis is questioned within the quadrigeminal plate cistern along the internal cerebral veins (series 12, image 75), although this is not definite. Posterior fossa mass effect related to the moderate edema associated with the dominant left cerebellar metastasis. Partial effacement  of the fourth ventricle. No evidence of hydrocephalus on the current examination. No evidence of acute infarct. No supratentorial midline shift. No extra-axial fluid collection. No chronic intracranial blood products. Additional mild scattered T2/FLAIR hyperintensity within the cerebral white matter is nonspecific, but consistent with chronic small vessel ischemic disease. Cerebral volume is normal for age. Vascular: Flow voids maintained within the proximal large arterial vessels. 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 3 mm enhancing focus within right occipital calvarium which appears to demonstrate precontrast T1 hyperintensity and is likely benign. Sinuses/Orbits: Visualized orbits demonstrate no acute abnormality. Left maxillary sinus mucous retention cyst. Minimal ethmoid sinus mucosal thickening.  No significant mastoid effusion. These results will be called to the ordering clinician or representative by the Radiologist Assistant, and communication documented in the PACS or zVision Dashboard. IMPRESSION: There are least 8 metastatic lesions within the supratentorial and infratentorial brain as outlined. An index peripherally enhancing mass within the left cerebellum measures 1.6 x 1.3 x 1.5 cm. Moderate surrounding edema. Resultant posterior fossa mass effect with partial effacement of the fourth ventricle. No evidence of hydrocephalus. 10 mm focus of enhancement within the left parietal calvarium with adjacent focal dural thickening and enhancement. 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 Mild chronic small vessel ischemic disease. Electronically Signed   By: Kellie Simmering DO   On: 09/30/2019 16:04   MR THORACIC SPINE W WO CONTRAST  Result Date: 10/20/2019 CLINICAL DATA:  Back pain. 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 EXAM: MRI THORACIC AND LUMBAR SPINE WITHOUT AND WITH CONTRAST TECHNIQUE: Multiplanar and multiecho pulse sequences of the thoracic and lumbar spine were obtained without and with intravenous contrast. CONTRAST:  46mL GADAVIST GADOBUTROL 1 MMOL/ML IV SOLN COMPARISON:  PET scan dated 10/01/2019 FINDINGS: MRI THORACIC SPINE FINDINGS Alignment:  Physiologic. Vertebrae: There are metastatic lesions involving the 7 and T8 vertebra. Tumor extends into the left side of the spinal canal extending from T6-7 disc space to the T8-9 disc space with a slight mass effect upon the left side of the thecal sac. The tumor extends into the left neural foramina at T6-7, T7-8 and T8-9. Tumor extends into the left pedicle of T8 and into the paraspinal soft tissues to the left of midline. There are small lesions in the right transverse process of T2 and in the right transverse processes of T10 and T11. There is a tiny lesion in the T3  vertebral body on STIR imaging which does not enhance after contrast. There is also a lesion in the superior aspect of L2. Cord: No myelopathy. The tumor in the left side of the spinal canal does not affect the spinal cord. Paraspinal and other soft tissues: There is abnormal enhancement of the paraspinal soft tissues to the left of midline at T7-8. There is a large mass in the right lower lobe. There is mediastinal adenopathy. Disc levels: There are no disc protrusions or significant disc bulges. No spinal or foraminal stenoses. MRI LUMBAR SPINE FINDINGS Segmentation:  Standard. Alignment:  Physiologic. Vertebrae: There is a 2 cm metastatic lesion in the superior aspect of L2 without a pathologic fracture. There is only slight enhancement of the lesion after contrast administration. There is a 19 mm lesion in the posterior aspect of the right iliac bone adjacent to the sacroiliac  joint. Conus medullaris: Extends to the L1-2 level and appears normal. Paraspinal and other soft tissues: Negative. Disc levels: L1-2: The disc is normal. The tumor involves the superior aspect of L2 without a pathologic fracture. No extension of tumor into the spinal canal. L2-3: Normal. L3-4: Tiny broad-based disc bulge with no neural impingement. Slight left facet arthritis. L4-5: Tiny broad-based disc bulge. Moderate bilateral facet arthritis. L5-S1: Soft disc extrusion to the right of midline extending inferiorly behind the body of L5 on the right slightly compressing the right S1 nerve root sleeve. IMPRESSION: 1. Multiple metastatic lesions in the thoracic and lumbar spine as well as in the right iliac bone. 2. Tumor extends into the left side of the spinal canal and into left neural foramina at T6-7, T7-8 and T8-9 with a slight mass effect upon the thecal sac at those levels. 3. No tumor extension into the spinal canal or neural foramina at the other levels of the thoracic or lumbar spine. 4. Large mass in the right lower lobe with  mediastinal adenopathy. Electronically Signed   By: Lorriane Shire M.D.   On: 10/20/2019 16:52   MR Lumbar Spine W Wo Contrast  Result Date: 10/20/2019 CLINICAL DATA:  Back pain. 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 EXAM: MRI THORACIC AND LUMBAR SPINE WITHOUT AND WITH CONTRAST TECHNIQUE: Multiplanar and multiecho pulse sequences of the thoracic and lumbar spine were obtained without and with intravenous contrast. CONTRAST:  67mL GADAVIST GADOBUTROL 1 MMOL/ML IV SOLN COMPARISON:  PET scan dated 10/01/2019 FINDINGS: MRI THORACIC SPINE FINDINGS Alignment:  Physiologic. Vertebrae: There are metastatic lesions involving the 7 and T8 vertebra. Tumor extends into the left side of the spinal canal extending from T6-7 disc space to the T8-9 disc space with a slight mass effect upon the left side of the thecal sac. The tumor extends into the left neural foramina at T6-7, T7-8 and T8-9. Tumor extends into the left pedicle of T8 and into the paraspinal soft tissues to the left of midline. There are small lesions in the right transverse process of T2 and in the right transverse processes of T10 and T11. There is a tiny lesion in the T3 vertebral body on STIR imaging which does not enhance after contrast. There is also a lesion in the superior aspect of L2. Cord: No myelopathy. The tumor in the left side of the spinal canal does not affect the spinal cord. Paraspinal and other soft tissues: There is abnormal enhancement of the paraspinal soft tissues to the left of midline at T7-8. There is a large mass in the right lower lobe. There is mediastinal adenopathy. Disc levels: There are no disc protrusions or significant disc bulges. No spinal or foraminal stenoses. MRI LUMBAR SPINE FINDINGS Segmentation:  Standard. Alignment:  Physiologic. Vertebrae: There is a 2 cm metastatic lesion in the superior aspect of L2 without a pathologic fracture. There is only slight enhancement of  the lesion after contrast administration. There is a 19 mm lesion in the posterior aspect of the right iliac bone adjacent to the sacroiliac joint. Conus medullaris: Extends to the L1-2 level and appears normal. Paraspinal and other soft tissues: Negative. Disc levels: L1-2: The disc is normal. The tumor involves the superior aspect of L2 without a pathologic fracture. No extension of tumor into the spinal canal. L2-3: Normal. L3-4: Tiny broad-based disc bulge with no neural impingement. Slight left facet arthritis. L4-5: Tiny broad-based disc bulge. Moderate bilateral facet  arthritis. L5-S1: Soft disc extrusion to the right of midline extending inferiorly behind the body of L5 on the right slightly compressing the right S1 nerve root sleeve. IMPRESSION: 1. Multiple metastatic lesions in the thoracic and lumbar spine as well as in the right iliac bone. 2. Tumor extends into the left side of the spinal canal and into left neural foramina at T6-7, T7-8 and T8-9 with a slight mass effect upon the thecal sac at those levels. 3. No tumor extension into the spinal canal or neural foramina at the other levels of the thoracic or lumbar spine. 4. Large mass in the right lower lobe with mediastinal adenopathy. Electronically Signed   By: Lorriane Shire M.D.   On: 10/20/2019 16:52   NM PET Image Initial (PI) Skull Base To Thigh  Result Date: 10/01/2019 CLINICAL DATA:  Initial treatment strategy for pulmonary mass. EXAM: NUCLEAR MEDICINE PET SKULL BASE TO THIGH TECHNIQUE: 8.1 mCi F-18 FDG was injected intravenously. Full-ring PET imaging was performed from the skull base to thigh after the radiotracer. CT data was obtained and used for attenuation correction and anatomic localization. Fasting blood glucose: 125 mg/dl COMPARISON:  Chest CT 09/24/2019 FINDINGS: Mediastinal blood pool activity: SUV max 2.7 Liver activity: SUV max NA NECK: Lower left cervical node is hypermetabolic with SUV max = 7.5. Incidental CT findings:  none CHEST: Hypermetabolic lymphadenopathy is identified in the right axilla, right thoracic inlet, mediastinum, and right hilum. Index right paratracheal node on 78/3 measures 2.6 cm short axis with SUV max = 10.5. Index subcarinal lymph node on 89/3 measures 2.3 cm with SUV max = 9.8. Left hilar lymphadenopathy is hypermetabolic with SUV max = 9.1. Right infrahilar mass lesion better characterized on recent diagnostic chest CT is hypermetabolic with SUV max = 06.3. 9 mm posterior right lower lobe nodule on 87/3 shows no discernible hypermetabolism on PET imaging. Incidental CT findings: Heart is enlarged. Trace pericardial effusion. Coronary artery calcification is evident. Atherosclerotic calcification is noted in the wall of the thoracic aorta. Please see report for recent diagnostic CT of the chest. ABDOMEN/PELVIS: No abnormal hypermetabolic activity within the liver, pancreas, adrenal glands, or spleen. No hypermetabolic lymph nodes in the abdomen or pelvis. Incidental CT findings: Gallbladder is surgically absent.There is abdominal aortic atherosclerosis without aneurysm. SKELETON: Scattered hypermetabolic bone metastases are identified including posterior right second rib ( SUV max = 4.6), left iliac crest (SUV max = 4.6), and anterior right iliac bone ( SUV max = 5.5). Hypermetabolic subcutaneous nodules are identified. 5 mm subcutaneous nodule lateral left abdominal wall (147/3) demonstrates SUV max = 1.9. 5 mm subcutaneous nodule superficial to the right rectus sheath on image 194/3 demonstrates SUV max = 2.3. 7 mm subcutaneous nodule lateral right hip on 246/3 demonstrates SUV max = 3.4. Incidental CT findings: Above described hypermetabolic bone lesion show no CT correlate. IMPRESSION: 1. Markedly hypermetabolic infrahilar right lower lobe mass with hypermetabolic nodal metastases in the right hilum, mediastinum, right supraclavicular region, left axilla and lower left neck. 2. Hypermetabolic rib,  vertebral body, and iliac bone lesions consistent with metastatic involvement. 3. Tiny hypermetabolic subcutaneous nodules also concerning for metastatic disease. 4.  Aortic Atherosclerois (ICD10-170.0) Electronically Signed   By: Misty Stanley M.D.   On: 10/01/2019 15:31   [name] CORE BIOPSY (LYMPH NODES)  Result Date: 10/08/2019 INDICATION: 63 year old with right lung mass and lymphadenopathy. Tissue diagnosis is needed. Patient has a hypermetabolic left cervical lymph node on recent PET-CT. EXAM: ULTRASOUND-GUIDED CORE BIOPSY OF LEFT CERVICAL  LYMPH NODE MEDICATIONS: None. ANESTHESIA/SEDATION: None FLUOROSCOPY TIME:  None COMPLICATIONS: None immediate. PROCEDURE: The procedure was explained to the patient. The risks and benefits of the procedure were discussed and the patient's questions were addressed. Informed consent was obtained from the patient. The left side of the neck was evaluated with ultrasound. Suspicious hypoechoic lymph node was identified just lateral to the left common carotid artery. Skin was prepped with chlorhexidine and sterile field was created. Skin and soft tissues were anesthetized with 1% lidocaine. Using ultrasound guidance, 18 gauge core device was directed into the lymph node. Total of 4 core biopsies were obtained. Specimens placed in saline. Bandage placed over the puncture site. FINDINGS: Suspicious hypoechoic lymph node just posterior to the left internal jugular vein and lateral to the left common carotid artery. Lymph node measures 1.0 cm in the short axis. This lymph node corresponds with the hypermetabolic lymph node on recent PET-CT. Four adequate core biopsies were obtained. No significant bleeding or hematoma formation following the core biopsies. IMPRESSION: Ultrasound-guided core biopsies of the suspicious left cervical lymph node. Electronically Signed   By: Markus Daft M.D.   On: 10/08/2019 14:37      IMPRESSION/PLAN: This is a very pleasant 64 y.o. 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  I had a lengthy discussion with the patient after reviewing their MRI results with them.    She understands that she will need another MRI, 3 Tesla, to finalize the number of brain lesions that need to be treated and to make sure that the size and shape of the brain tumors are as up-to-date as possible before treatment planning.  I believe she will be a candidate for the following trial, pending the results to the MRI:   [employer] [facility] ([facility]) A PHASE III TRIAL OF STEREOTACTIC RADIOSURGERY COMPARED WITH HIPPOCAMPAL-AVOIDANT WHOLE BRAIN RADIOTHERAPY (HA-WBRT) PLUS MEMANTINE FOR 5-15 BRAIN METASTASES [facility], [facility], and [facility] Oncology Protocol Number: CE.7  We spoke about the two arms of this trial.   We spoke about the differing risks benefits and side effects of both of these treatments. During part of our discussion, we spoke about the hair loss, fatigue and cognitive effects that can result from whole brain radiotherapy, even if this is given with memantine and hippocampal sparing.  However, I explained that whole brain radiotherapy is more comprehensive and therefore can decrease the chance of recurrences elsewhere in the brain, while stereotactic radiosurgery only treats the areas of gross disease while sparing the rest of the brain parenchyma.  She understands that enrollment on the trial is completely voluntary and that her treatment would be randomized.  After lengthy discussion, the patient would like to proceed enrollment on this trial if she is eligible.  She has been placed in touch with the research nurse.  I was present during the conversation between all 3 of them: Doreatha Martin, RN, the patient, and her husband.  The patient will come in tomorrow for cognitive testing and signing the forms and the research team will help with interfacing with her insurance company.  I also spoke with Dr. Lorenso Courier of medical  oncology who will tentatively start systemic therapy next week.  I shared with him that the patient would like to start systemic therapy as soon as reasonably possible.  We will need to try to minimize overlap between the systemic therapy and the radiation therapy to the brain, and also try to minimize overlap with treatment to  the spine (see below) while being  cognizant of potential delays in treatment; tentatively systemic therapy will be pemetrexed, pembrolizumab, and carboplatin, pending further results from pathology.   We will also bring her into our clinic in the near future next week for radiation planning to her spine where she is having pain-anticipate 10 fractions to the thoracic spine where she is having pain; it is unclear if the area of soft tissue swelling in the left paraspinal region of her back is malignant or not and I will review her scans again with radiology.  She understands that the risks and side effects of palliative treatment to the spine may include but not necessarily be limited to fatigue, skin irritation, esophagitis, injury to the internal organs such as the heart or lungs.  The patient and her husband were very appreciative of this call.  I look forward to taking care of her.  This encounter was provided by telemedicine platform MyChart video The patient has given verbal consent for this type of encounter and has been advised to only accept a meeting of this type in a secure network environment. The time spent during this encounter on date of service, in total, was 80 minutes. The attendants for this meeting include [name]  and KARINNA BEADLES.  During the encounter, [name] was located at [facility] Radiation Oncology Department.  [name] was located at home.   __________________________________________   [name], MD  This document serves as a record of services personally performed by [name], MD. It was created on  her behalf by Wilburn Mylar, a trained medical scribe. The creation of this record is based on the scribe's personal observations and the provider's statements to them. This document has been checked and approved by the attending provider.

## 2019-10-21 ENCOUNTER — Encounter: Payer: Self-pay | Admitting: *Deleted

## 2019-10-21 ENCOUNTER — Encounter: Payer: Self-pay | Admitting: Radiation Oncology

## 2019-10-21 ENCOUNTER — Other Ambulatory Visit: Payer: Self-pay

## 2019-10-21 ENCOUNTER — Encounter (HOSPITAL_COMMUNITY): Payer: Self-pay | Admitting: Hematology and Oncology

## 2019-10-21 ENCOUNTER — Ambulatory Visit
Admission: RE | Admit: 2019-10-21 | Discharge: 2019-10-21 | Disposition: A | Discharge status: Home / Self Care | Payer: BC Managed Care – PPO | Source: Ambulatory Visit | Attending: Radiation Oncology | Admitting: Radiation Oncology

## 2019-10-21 ENCOUNTER — Other Ambulatory Visit: Payer: Self-pay | Admitting: Radiation Therapy

## 2019-10-21 ENCOUNTER — Telehealth: Payer: Self-pay | Admitting: Hematology and Oncology

## 2019-10-21 DIAGNOSIS — C7951 Secondary malignant neoplasm of bone: Secondary | ICD-10-CM | POA: Diagnosis not present

## 2019-10-21 DIAGNOSIS — C778 Secondary and unspecified malignant neoplasm of lymph nodes of multiple regions: Secondary | ICD-10-CM | POA: Diagnosis not present

## 2019-10-21 DIAGNOSIS — C3491 Malignant neoplasm of unspecified part of right bronchus or lung: Secondary | ICD-10-CM

## 2019-10-21 DIAGNOSIS — C7931 Secondary malignant neoplasm of brain: Secondary | ICD-10-CM

## 2019-10-21 DIAGNOSIS — C7949 Secondary malignant neoplasm of other parts of nervous system: Secondary | ICD-10-CM

## 2019-10-21 DIAGNOSIS — C3431 Malignant neoplasm of lower lobe, right bronchus or lung: Secondary | ICD-10-CM | POA: Diagnosis not present

## 2019-10-21 NOTE — Telephone Encounter (Signed)
Called Virginia [name] to discuss the plan moving forward regarding radiation and chemotherapy. I noted that if we delayed chemotherapy for completion of the radiation treatments that we would not be able to start x 4 weeks. I expressed to the patient that I am not comfortable with delaying treatment for so long. After discussion of the risks/benefits of giving chemotherapy at the same time as radiation therapy to the brain and spine she was agreeable to moving forward. We will plan to start chemotherapy treatment next week after a clinic visit and chemotherapy education. The patient and her husband voiced their understanding.   Ledell Peoples, MD Department of Hematology/Oncology [place] at [facility] Phone: [phone] Pager: [phone] Email: [email or web address]

## 2019-10-21 NOTE — Research (Signed)
[employer] [facility] (CCTG) A PHASE III TRIAL OF STEREOTACTIC RADIOSURGERY COMPARED WITH HIPPOCAMPAL-AVOIDANT WHOLE BRAIN RADIOTHERAPY (HA-WBRT) PLUS MEMANTINE FOR 5-15 BRAIN METASTASES [facility], [facility], and [facility] Oncology Protocol Number: CE.7  10/21/19 11:58am  Dr. Isidore Moos introduced the patient Virginia [name] and her spouse to the CE.7 trial today during the patient's virtual consult.  Dr. Isidore Moos called this RN to virtually speak with the patient as well.  The trial was briefly discussed, including the study arms, randomization, and the voluntary nature of participation.  This RN also explained to the patient that the trial does not pay for the treatments or exams so it was important to ensure that the patient's insurance would cover the treatments (either arm) and follow up MRIs and exams as a part of their decision making for participation in the trial.   The patient and her spouse verbalized understanding and expressed interest in participation.  They were willing to have the consent form and HIPAA authorization emailed to them for review.  This RN has emailed the forms to them.  The patient and her spouse are available to come in to meet with a Research RN to review the consent form and HIPAA Authorization, and this appointment has been scheduled for tomorrow.    Doreatha Martin, RN, BSN, [facility] 10/21/2019 2:19 PM

## 2019-10-21 NOTE — Progress Notes (Signed)
Oncology Nurse Navigator Documentation  Oncology Nurse Navigator Flowsheets 10/21/2019  Abnormal Finding Date 09/08/2019  Confirmed Diagnosis Date 10/08/2019  Diagnosis Status Pending Molecular Studies  Navigator Follow Up Date: 10/28/2019  Navigator Follow Up Reason: Awaiting Molecular Testing  Navigator Location [facility]  Referral Date to RadOnc/MedOnc -  Navigator Encounter Type Other/I updated Dr. Lorenso Courier on PDL 1 report.   Telephone -  Treatment Phase Pre-Tx/Tx Discussion  Barriers/Navigation Needs Coordination of Care  Education -  Interventions Coordination of Care  Acuity Level 2-Minimal Needs (1-2 Barriers Identified)  Coordination of Care Other  Education Method -  Time Spent with Patient 15

## 2019-10-21 NOTE — Progress Notes (Signed)
Location/Histology of Brain Tumor:  There are multiple enhancing intracranial metastases as follows. -An index peripherally enhancing metastasis within the paramedian left cerebellum measures 1.6 x 1.3 x 1.5 cm (series 12, image 37). Moderate surrounding edema. -4 mm lesion within the right cerebellum (series 12, image 37). Mild surrounding edema. -Punctate lesion within the midline inferior cerebellum (series 12, image 34). -5 mm peripherally enhancing lesion within the right occipital lobe (series 12, image 61). Mild surrounding edema. -2 mm lesion within the right parietal lobe (series 12, image 86). Minimal associated edema. -3 mm peripherally enhancing lesion within right parietal lobe (series 12, image 101). Minimal associated edema. -Punctate lesion within the left precentral gyrus (series 12, image 118). Mild surrounding edema. -3 mm lesion within the anterior left frontal lobe (series 12, image 121). -An additional 2 mm peripherally enhancing metastasis is questioned within the quadrigeminal plate cistern along the internal cerebral veins (series 12, image 75), although this is not definite.  Patient presented with symptoms of:  She presented to the Emergency Department after a fall on 09/30/19. She hit the back of her head and lost consciousness. She had been drinking at the time, which most likely caused her fall. A CT of her head was performed which demonstrated a lesion in her posterior fossa.   Past or anticipated interventions, if any, per neurosurgery:  None  Past or anticipated interventions, if any, per medical oncology:  10/12/19 Dr. Lorenso Courier ASSESSMENT & PLAN Virginia [name] 64 y.o. female with medical history significant for GERD, anxiety, and HTN who presents for evaluation of newly diagnosed metastatic adenocarcinoma of the lung.  After review of the pathology and of the imaging her findings are most consistent with a metastatic adenocarcinoma the lung  with spread to localize lymph nodes, osseous structures, and the brain.  Given these findings it would be best to progress with systemic therapy and given the patient's good functional status I think should be an excellent candidate for carboplatin pemetrexed and pembrolizumab.  Prior to starting the systemic therapy I would like to have her brain tumor reviewed by Dr. Mickeal Skinner and radiation oncology.  The patient is specifically requested evaluation by neurology given the symptoms that she has been experiencing post fall including headache, brain fog, and some confusion.  I think it would be reasonable to have Dr. Mickeal Skinner evaluate to assure that the patient does not have symptoms of a concussion in addition to the brain tumor.  If no radiation oncology intervention is recommended then we will begin treatment with systemic chemotherapy.  Today we discussed the overall prognosis of adenocarcinoma the lung, noting that the 5-year survival of this cancer is very poor.  I also noted that with specific genetic mutations that prognosis could improve.  We discussed the treatment of carboplatin/pemetrexed/pembrolizumab and what to expect regarding this treatment.  We will plan for a full visit prior to starting therapy for detailed discussion regarding this regimen.  All questions that the patient and her husband had were answered during our visit today.  We will plan to see the patient back in approximately 2 weeks for presumed starting of chemotherapy.  # Metastatic Adenocarcinoma of the Lung --will discuss the care of this patient with neuro-oncology and consider radiation oncology for treatment of the metastatic disease to the brain --Foundation One and PD-L1 studies to be ordered today with the assistance of our lung navigator.  --will tentatively plan to start empiric chemotherapy with carboplatin/pemetrexed/pembrolizumab until these studies result. Will assure that no  intervention is required on the brain tumor  first. Possible start in approximately 2 weeks time. --will schedule port placement in the interim.   --patient has an ECOG 1 and is an excellent candidate for treatment.  --RTC in 2 weeks for possible start of therapy.   #Headache #Possible Concussion --on patient request, will refer to Dr. Mickeal Skinner for evaluation of the headaches, concern for concussion, and assessment of the brain lesion --will discuss with Dr. Mickeal Skinner and Rad/Onc the utility of radiation to the brain lesion --continue to monitor    10/15/19 Dr. Mickeal Skinner Assessment/Plan Brain metastases [facility]) [name] presents with clinical syndrome localizing to posterior fossa and whole brain, consistent with overalapping etiologies of brain metastases with cerebral edema, and CNS trauma/concussion.  Rest and dexamethasone have addressed or corrected most of the clinical deficits residual from these processes; at this time she exhibits very minor imbalance only.  We counseled her on recommended treatment pathways for brain metastases from adenocarcinoma, including radiosurgery and whole brain radiation therapy.  We will arrange consultation with radiation oncology and discuss her case in brain/spine tumor board next week. She was agreeable to proceed with determined course of radiation, likely radiosurgery or FSRT.  Dose of Decadron, if applicable: She is taking 2 mg daily currently.   Recent neurologic symptoms, if any:   Seizures: No  Headaches: No. She did have headaches before her fall.   Nausea: No  Dizziness/ataxia: Yes, if she moves her head too fast.   Difficulty with hand coordination:   Focal numbness/weakness: Yes  Visual deficits/changes: No  Confusion/Memory deficits: No, she does report some word finding difficulties at times.   Painful bone metastases at present, if any: She has L and T spine metastases which are painful. Dr. Lorenso Courier is helping her manage her pain. She is taking oxycodone alternating with  ibuprofen for pain.    SAFETY ISSUES:  Prior radiation? No  Pacemaker/ICD? No  Possible current pregnancy? No  Is the patient on methotrexate? No  Additional Complaints / other details:  10/08/19 FINAL MICROSCOPIC DIAGNOSIS: A. LYMPH NODE, LEFT CERVICAL, NEEDLE CORE BIOPSY: - Poorly differentiated non-small cell carcinoma.

## 2019-10-22 ENCOUNTER — Other Ambulatory Visit: Payer: Self-pay | Admitting: Radiation Therapy

## 2019-10-22 ENCOUNTER — Other Ambulatory Visit: Payer: Self-pay | Admitting: Hematology and Oncology

## 2019-10-22 ENCOUNTER — Encounter: Payer: BC Managed Care – PPO | Admitting: *Deleted

## 2019-10-22 ENCOUNTER — Telehealth: Payer: Self-pay | Admitting: *Deleted

## 2019-10-22 ENCOUNTER — Other Ambulatory Visit: Payer: Self-pay | Admitting: Student

## 2019-10-22 ENCOUNTER — Other Ambulatory Visit: Payer: Self-pay | Admitting: Radiology

## 2019-10-23 ENCOUNTER — Ambulatory Visit (HOSPITAL_COMMUNITY)
Admission: RE | Admit: 2019-10-23 | Discharge: 2019-10-23 | Disposition: A | Discharge status: Home / Self Care | Payer: BC Managed Care – PPO | Source: Ambulatory Visit | Attending: Hematology and Oncology | Admitting: Hematology and Oncology

## 2019-10-23 ENCOUNTER — Other Ambulatory Visit: Payer: Self-pay

## 2019-10-23 ENCOUNTER — Encounter (HOSPITAL_COMMUNITY): Payer: Self-pay

## 2019-10-23 ENCOUNTER — Telehealth: Payer: Self-pay | Admitting: *Deleted

## 2019-10-23 ENCOUNTER — Other Ambulatory Visit: Payer: Self-pay | Admitting: Hematology and Oncology

## 2019-10-23 ENCOUNTER — Telehealth: Payer: Self-pay | Admitting: Internal Medicine

## 2019-10-23 DIAGNOSIS — C3431 Malignant neoplasm of lower lobe, right bronchus or lung: Secondary | ICD-10-CM | POA: Insufficient documentation

## 2019-10-23 DIAGNOSIS — K219 Gastro-esophageal reflux disease without esophagitis: Secondary | ICD-10-CM | POA: Insufficient documentation

## 2019-10-23 DIAGNOSIS — Z79899 Other long term (current) drug therapy: Secondary | ICD-10-CM | POA: Diagnosis not present

## 2019-10-23 DIAGNOSIS — F419 Anxiety disorder, unspecified: Secondary | ICD-10-CM | POA: Diagnosis not present

## 2019-10-23 DIAGNOSIS — E7800 Pure hypercholesterolemia, unspecified: Secondary | ICD-10-CM | POA: Diagnosis not present

## 2019-10-23 DIAGNOSIS — I10 Essential (primary) hypertension: Secondary | ICD-10-CM | POA: Insufficient documentation

## 2019-10-23 DIAGNOSIS — C3490 Malignant neoplasm of unspecified part of unspecified bronchus or lung: Secondary | ICD-10-CM | POA: Diagnosis not present

## 2019-10-23 DIAGNOSIS — Z87891 Personal history of nicotine dependence: Secondary | ICD-10-CM | POA: Insufficient documentation

## 2019-10-23 DIAGNOSIS — Z23 Encounter for immunization: Secondary | ICD-10-CM

## 2019-10-23 DIAGNOSIS — Z5111 Encounter for antineoplastic chemotherapy: Secondary | ICD-10-CM | POA: Diagnosis not present

## 2019-10-23 HISTORY — PX: IR IMAGING GUIDED PORT INSERTION: IMG5740

## 2019-10-23 HISTORY — DX: Dyspnea, unspecified: R06.00

## 2019-10-23 MED ORDER — SODIUM CHLORIDE 0.9 % IV SOLN: INTRAVENOUS | Status: DC

## 2019-10-23 MED ORDER — HEPARIN SOD (PORK) LOCK FLUSH 100 UNIT/ML IV SOLN
INTRAVENOUS | Status: AC
  Filled 2019-10-23: qty 5

## 2019-10-23 MED ORDER — MIDAZOLAM HCL 2 MG/2ML IJ SOLN
INTRAMUSCULAR | Status: AC | PRN
  Administered 2019-10-23 (×2): 1 mg via INTRAVENOUS

## 2019-10-23 MED ORDER — CYCLOBENZAPRINE HCL 10 MG PO TABS: 10.0000 mg | ORAL_TABLET | Freq: Three times a day (TID) | ORAL | 0 refills | Status: DC | PRN

## 2019-10-23 MED ORDER — MIDAZOLAM HCL 2 MG/2ML IJ SOLN
INTRAMUSCULAR | Status: AC
  Filled 2019-10-23: qty 4

## 2019-10-23 MED ORDER — FENTANYL CITRATE (PF) 100 MCG/2ML IJ SOLN
INTRAMUSCULAR | Status: AC
  Filled 2019-10-23: qty 2

## 2019-10-23 MED ORDER — OXYCODONE HCL 5 MG PO TABS: 5.0000 mg | ORAL_TABLET | Freq: Four times a day (QID) | ORAL | 0 refills | Status: DC | PRN

## 2019-10-23 MED ORDER — LIDOCAINE-EPINEPHRINE (PF) 1 %-1:200000 IJ SOLN
INTRAMUSCULAR | Status: AC | PRN
  Administered 2019-10-23 (×2): 10 mL

## 2019-10-23 MED ORDER — FENTANYL CITRATE (PF) 100 MCG/2ML IJ SOLN
INTRAMUSCULAR | Status: AC | PRN
  Administered 2019-10-23 (×2): 50 ug via INTRAVENOUS

## 2019-10-23 MED ORDER — CEFAZOLIN SODIUM-DEXTROSE 2-4 GM/100ML-% IV SOLN: 2.0000 g | Freq: Once | INTRAVENOUS | Status: AC

## 2019-10-23 MED ORDER — CEFAZOLIN SODIUM-DEXTROSE 2-4 GM/100ML-% IV SOLN
INTRAVENOUS | Status: AC
  Administered 2019-10-23: 2 g via INTRAVENOUS
  Filled 2019-10-23: qty 100

## 2019-10-23 MED ORDER — LIDOCAINE-EPINEPHRINE 1 %-1:100000 IJ SOLN
INTRAMUSCULAR | Status: AC
  Filled 2019-10-23: qty 1

## 2019-10-23 NOTE — Procedures (Signed)
Pre Procedure Dx: Poor venous access Post Procedural Dx: Same  Successful placement of right IJ approach port-a-cath with tip at the superior caval atrial junction. The catheter is ready for immediate use.  Estimated Blood Loss: Minimal  Complications: None immediate.  Jay Taziah Difatta, MD Pager #: [phone]   

## 2019-10-23 NOTE — Telephone Encounter (Signed)
[facility]: CE.7 Trial Patient and husband received copy of consent and hippa forms via email and reviewed them at home.  This research nurse spent one hour discussing the study and consent form with husband, [name], on the phone.  Patient also participated on the call on speaker phone, although [name] was the main one speaking. [name] asked questions as we went through the consent form. This nurse was able to answer some of his questions related to the study, but there were many more questions specifically regarding side effects and efficacy of each treatment (HA-WBRT vs. SRS) which research nurse needed to defer to Dr. Isidore Moos. There was also understandable confusion due to the consent form stating that HA-WBRT is the usual approach to treatment and that SRS is the study group.  In our clinic, HA-WBRT is not the usual approach.  SRS is the usual approach.  If patient does not go on study she and is treated in our clinic, she will be offered SRS as the usual approach.  Research nurse will  seek clarification from Dr. Isidore Moos on this matter.  [name] voiced need to confirm that patient's insurance will cover the study related costs of treatment, visits, MRIs and any potential treatment or hospitalizations needed as a result of this study.  Informed [name] that our Geophysicist/field seismologist, Doreatha Martin, has reached out to our managed care department to get answers for these questions. We understand it is vital to have approval from insurance company before enrolling in this research study. Research nurse also encouraged [name] to call insurance company to ask these questions himself and he requested assistance from someone in managed care (someone used to working with insurance companies) to be on conference call with them to facilitate this discussion. Research nurse reviewed other study related activities and timeline for completing these activities in time for patient to be randomized prior to XRT Simulation next week as scheduled  for 10/29/19. He verbalized understanding. He stated if patient decides to enroll on this study then they could come in to clinic next week Monday or Tuesday to sign consent and complete baseline activities (neurocognitive testing, questionnaires and blood/urine sample collection) so that patient could be randomized prior to simulation.  Thanked patient and her husband for their time today and for considering this study.  Informed this nurse will present questions to Dr. Isidore Moos and call them back tomorrow.  Encouraged them to call or email research nurse if any questions in the meantime. Leeroy Bock my email address and he emailed the list of questions.  [name], BSN, [employer]

## 2019-10-23 NOTE — Telephone Encounter (Addendum)
TCT pt's husband and reviewed all her pain medications with him. Advised to stop the ibuprofen and to start tylenol 500 mg 2 tabs every 8 hours.  Pt is having some problems with constipation. She has not had a good BM in 6 days.  Advised to take 1/2 bottle of Mag Citrate today and if no BM , take the other half of the bottle. Advised to start taking senna plus 3 tablets @ night, adjust as needed but not more than 4 in the am and 4 in the pm.  [name] voiced understanding.  Reviewed their schedule for next week which is quite busy with Radiation beginning and 1st time chemo as well. Advised to have them both rest as much as possible and eat well, drink plenty of fluids.  [name] voiced understanding. He understands to call with any questions or concerns to [phone]

## 2019-10-23 NOTE — Telephone Encounter (Signed)
Received call from [facility], RN in [place]. She has been speaking with patient's husband regarding upcoming Radiation treatments.  Mr. Virginia [name] is requesting refill on his wife's Oxycodone 5 mg. Pt's prescription was filled on 10/16/19 for # 30. He is also asking for refill on her Flexeril 10mg  1 tab every 8 hours as needed. This was also ordered on 10/16/19. He is also asking for a refill on Ibuprofen 800 mg.This was ordered by pt's PCP MD in 2019. TCT pt's husband and spoke with him. Reviewed pt's pain medication with him. 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 She is taking oxycodone 5 mg every 6 hours, ibuprofen 800 mg every 6 hours and flexeril 10 mg every 8 hours.  They are alternating the ibuprofen and the oxycodone as directed from their previous visit here. Discussed the above with Dr. Lorenso Courier. He advises to stop the Ibuprofen as pt is also on decadron for brain mets d/t concerns about GI affects with both decadron and high dose ibuprofen. Dr. Lorenso Courier recommends Tylenol 1000mg  every 8 hours as needed instead of ibprofen.  TCT pt's husband and reviewed the above with him. He voiced undertsanding

## 2019-10-23 NOTE — Telephone Encounter (Signed)
Has she had this before?

## 2019-10-23 NOTE — Telephone Encounter (Signed)
Called patient she was supposed to call me back she was getting a call from the cancer center when I called her.

## 2019-10-23 NOTE — Progress Notes (Signed)
Error, duplicate

## 2019-10-23 NOTE — Discharge Instructions (Signed)
Implanted [facility] Guide An implanted port is a device that is placed under the skin. It is usually placed in the chest. The device can be used to give IV medicine, to take blood, or for dialysis. You may have an implanted port if:  You need IV medicine that would be irritating to the small veins in your hands or arms.  You need IV medicines, such as antibiotics, for a long period of time.  You need IV nutrition for a long period of time.  You need dialysis. Having a port means that your health care provider will not need to use the veins in your arms for these procedures. You may have fewer limitations when using a port than you would if you used other types of long-term IVs, and you will likely be able to return to normal activities after your incision heals. An implanted port has two main parts:  Reservoir. The reservoir is the part where a needle is inserted to give medicines or draw blood. The reservoir is round. After it is placed, it appears as a small, raised area under your skin.  Catheter. The catheter is a thin, flexible tube that connects the reservoir to a vein. Medicine that is inserted into the reservoir goes into the catheter and then into the vein. How is my port accessed? To access your port:  A numbing cream may be placed on the skin over the port site.  Your health care provider will put on a mask and sterile gloves.  The skin over your port will be cleaned carefully with a germ-killing soap and allowed to dry.  Your health care provider will gently pinch the port and insert a needle into it.  Your health care provider will check for a blood return to make sure the port is in the vein and is not clogged.  If your port needs to remain accessed to get medicine continuously (constant infusion), your health care provider will place a clear bandage (dressing) over the needle site. The dressing and needle will need to be changed every week, or as told by your health care  provider. What is flushing? Flushing helps keep the port from getting clogged. Follow instructions from your health care provider about how and when to flush the port. Ports are usually flushed with saline solution or a medicine called heparin. The need for flushing will depend on how the port is used:  If the port is only used from time to time to give medicines or draw blood, the port may need to be flushed: ? Before and after medicines have been given. ? Before and after blood has been drawn. ? As part of routine maintenance. Flushing may be recommended every 4-6 weeks.  If a constant infusion is running, the port may not need to be flushed.  Throw away any syringes in a disposal container that is meant for sharp items (sharps container). You can buy a sharps container from a pharmacy, or you can make one by using an empty hard plastic bottle with a cover. How long will my port stay implanted? The port can stay in for as long as your health care provider thinks it is needed. When it is time for the port to come out, a surgery will be done to remove it. The surgery will be similar to the procedure that was done to put the port in. Follow these instructions at home:   Flush your port as told by your health care provider.  If you need an infusion over several days, follow instructions from your health care provider about how to take care of your port site. Make sure you: ? Wash your hands with soap and water before you change your dressing. If soap and water are not available, use alcohol-based hand sanitizer. ? Change your dressing as told by your health care provider. ? Place any used dressings or infusion bags into a plastic bag. Throw that bag in the trash. ? Keep the dressing that covers the needle clean and dry. Do not get it wet. ? Do not use scissors or sharp objects near the tube. ? Keep the tube clamped, unless it is being used.  Check your port site every day for signs of  infection. Check for: ? Redness, swelling, or pain. ? Fluid or blood. ? Pus or a bad smell.  Protect the skin around the port site. ? Avoid wearing bra straps that rub or irritate the site. ? Protect the skin around your port from seat belts. Place a soft pad over your chest if needed.  Bathe or shower as told by your health care provider. The site may get wet as long as you are not actively receiving an infusion.  Return to your normal activities as told by your health care provider. Ask your health care provider what activities are safe for you.  Carry a medical alert card or wear a medical alert bracelet at all times. This will let health care providers know that you have an implanted port in case of an emergency. Get help right away if:  You have redness, swelling, or pain at the port site.  You have fluid or blood coming from your port site.  You have pus or a bad smell coming from the port site.  You have a fever. Summary  Implanted ports are usually placed in the chest for long-term IV access.  Follow instructions from your health care provider about flushing the port and changing bandages (dressings).  Take care of the area around your port by avoiding clothing that puts pressure on the area, and by watching for signs of infection.  Protect the skin around your port from seat belts. Place a soft pad over your chest if needed.  Get help right away if you have a fever or you have redness, swelling, pain, drainage, or a bad smell at the port site. This information is not intended to replace advice given to you by your health care provider. Make sure you discuss any questions you have with your health care provider. Document Revised: 12/19/2018 Document Reviewed: 09/29/2016 Elsevier Patient Education  [employer]. Moderate Conscious Sedation, Adult, Care After These instructions provide you with information about caring for yourself after your procedure. Your health  care provider may also give you more specific instructions. Your treatment has been planned according to current medical practices, but problems sometimes occur. Call your health care provider if you have any problems or questions after your procedure. What can I expect after the procedure? After your procedure, it is common:  To feel sleepy for several hours.  To feel clumsy and have poor balance for several hours.  To have poor judgment for several hours.  To vomit if you eat too soon. Follow these instructions at home: For at least 24 hours after the procedure:   Do not: ? Participate in activities where you could fall or become injured. ? Drive. ? Use heavy machinery. ? Drink alcohol. ? Take sleeping  pills or medicines that cause drowsiness. ? Make important decisions or sign legal documents. ? Take care of children on your own.  Rest. Eating and drinking  Follow the diet recommended by your health care provider.  If you vomit: ? Drink water, juice, or soup when you can drink without vomiting. ? Make sure you have little or no nausea before eating solid foods. General instructions  Have a responsible adult stay with you until you are awake and alert.  Take over-the-counter and prescription medicines only as told by your health care provider.  If you smoke, do not smoke without supervision.  Keep all follow-up visits as told by your health care provider. This is important. Contact a health care provider if:  You keep feeling nauseous or you keep vomiting.  You feel light-headed.  You develop a rash.  You have a fever. Get help right away if:  You have trouble breathing. This information is not intended to replace advice given to you by your health care provider. Make sure you discuss any questions you have with your health care provider. Document Revised: 08/09/2017 Document Reviewed: 12/17/2015 Elsevier Patient Education  2020 [employer].

## 2019-10-23 NOTE — H&P (Signed)
Chief Complaint: Patient was seen in consultation today for port palcement at the request of [place] T IV  Referring Physician(s): Dorsey,John T IV  Supervising Physician: Sandi Mariscal  Patient Status: [facility] - Out-pt  History of Present Illness: Virginia [name] is a 64 y.o. female with metastatic lung cancer. She is to start both chemotherapy and radiation therapy next week. She is referred for port placement PMHx, meds, labs, imaging, allergies reviewed. Feels well, no recent fevers, chills, illness. Has been NPO today as directed.   Past Medical History:  Diagnosis Date  . Anxiety   . Dyspnea   . GERD (gastroesophageal reflux disease)   . Hypercholesterolemia    per pt, she does not have elevated lipids  . Hypertension   . Tobacco abuse     Past Surgical History:  Procedure Laterality Date  . ABDOMINAL HYSTERECTOMY     partial/ left ovaries  . CHOLECYSTECTOMY    . DILATION AND CURETTAGE OF UTERUS    . LIPOMA EXCISION  2018   removed under left breast and right thigh.  . TUBAL LIGATION      Allergies: Patient has no known allergies.  Medications: Prior to Admission medications   Medication Sig Start Date End Date Taking? Authorizing Provider  acetaminophen (TYLENOL) 500 MG tablet Take 500-1,000 mg by mouth every 6 (six) hours as needed (for pain.).   Yes [provider]  albuterol (VENTOLIN HFA) 108 (90 Base) MCG/ACT inhaler Inhale 2 puffs into the lungs every 6 (six) hours as needed for wheezing or shortness of breath. 10/15/19  Yes Baxley, Cresenciano Lick, MD  APPLE CIDER VINEGAR PO Take 450 mg by mouth daily.   Yes [provider]  Budeson-Glycopyrrol-Formoterol (BREZTRI AEROSPHERE) 160-9-4.8 MCG/ACT AERO Inhale 2 puffs into the lungs 2 (two) times daily.   Yes [provider]  cyclobenzaprine (FLEXERIL) 10 MG tablet Take 1 tablet (10 mg total) by mouth 3 (three) times daily as needed for muscle spasms. 10/16/19  Yes Orson Slick, MD   dexamethasone (DECADRON) 2 MG tablet Take 1 tablet (2 mg total) by mouth 2 (two) times daily. Patient taking differently: Take 2 mg by mouth daily.  09/30/19  Yes Collene Gobble, MD  ELDERBERRY PO Take 1,250 mg by mouth daily.   Yes [provider]  hydrochlorothiazide (HYDRODIURIL) 25 MG tablet TAKE 1 TABLET(25 MG) BY MOUTH DAILY Patient taking differently: Take 25 mg by mouth daily.  08/12/19  Yes Baxley, Cresenciano Lick, MD  ibuprofen (ADVIL,MOTRIN) 800 MG tablet take 1 tablet by mouth every 8 hours if needed for pain Patient taking differently: Take 800 mg by mouth every 8 (eight) hours as needed (pain.).  05/22/18  Yes Baxley, Cresenciano Lick, MD  L-Lysine 500 MG TABS Take 500 mg by mouth 2 (two) times daily as needed (cold sores/fever blisters.).   Yes [provider]  losartan (COZAAR) 100 MG tablet TAKE 1 TABLET(100 MG) BY MOUTH DAILY Patient taking differently: Take 100 mg by mouth daily. TAKE 1 TABLET(100 MG) BY MOUTH DAILY 08/12/19  Yes Baxley, Cresenciano Lick, MD  OVER THE COUNTER MEDICATION Apply 1 application topically daily as needed (shoulder pain.). [place] Gel OTC   Yes [provider]  oxyCODONE (OXY IR/ROXICODONE) 5 MG immediate release tablet Take 1-2 tablets (5-10 mg total) by mouth every 6 (six) hours as needed for severe pain or breakthrough pain. 10/16/19  Yes Orson Slick, MD  pantoprazole (PROTONIX) 40 MG tablet TAKE 1 TABLET(40 MG)  BY MOUTH DAILY 05/27/19  Yes Baxley, Cresenciano Lick, MD  polyethylene glycol (MIRALAX / GLYCOLAX) 17 g packet Take 17 g by mouth daily.   Yes [provider]  Probiotic Product (EQL PROBIOTIC COLON SUPPORT) CAPS Take 1 capsule by mouth daily.   Yes [provider]  psyllium (METAMUCIL) 58.6 % powder Take 1 packet by mouth daily.   Yes [provider]  rosuvastatin (CRESTOR) 5 MG tablet TAKE 1 TABLET BY MOUTH THREE TIMES WEEKLY AS DIRECTED Patient taking differently: Take 5 mg by mouth every Monday, Wednesday, and Friday. TAKE  1 TABLET BY MOUTH THREE TIMES WEEKLY AS DIRECTED 09/25/19  Yes Baxley, Cresenciano Lick, MD  Vitamin D3 (VITAMIN D) 25 MCG tablet Take 2,000 Units by mouth daily.   Yes [provider]  ALPRAZolam (XANAX) 0.25 MG tablet TAKE 1 TABLET BY MOUTH TWICE DAILY AS NEEDED FOR ANXIETY 01/06/19   Elby Showers, MD  aspirin 325 MG EC tablet Take 325 mg by mouth every 8 (eight) hours as needed for pain.    [provider]  Carboxymethylcellul-Glycerin (CLEAR EYES FOR DRY EYES) 1-0.25 % SOLN Place 1 drop into both eyes 3 (three) times daily as needed.    [provider]  ondansetron (ZOFRAN) 4 MG tablet Take 1 tablet (4 mg total) by mouth every 8 (eight) hours as needed for nausea or vomiting. Patient not taking: Reported on 10/21/2019 09/29/19   Elby Showers, MD     Family History  Problem Relation Age of Onset  . Heart disease Father   . Drug abuse Daughter   . Drug abuse Son   . Cancer Sister   . Heart disease Brother   . Heart attack Brother     Social History   Socioeconomic History  . Marital status: Married    Spouse name: Not on file  . Number of children: 3  . Years of education: Not on file  . Highest education level: Not on file  Occupational History  . Occupation: owns Medical illustrator: [employer]  Tobacco Use  . Smoking status: Former Smoker    Packs/day: 0.25    Quit date: 10/31/2012    Years since quitting: 6.9  . Smokeless tobacco: Never Used  Substance and Sexual Activity  . Alcohol use: Yes    Alcohol/week: 0.0 standard drinks    Comment: rare  . Drug use: No  . Sexual activity: Yes  Other Topics Concern  . Not on file  Social History Narrative  . Not on file   Social Determinants of Health   Financial Resource Strain:   . Difficulty of Paying Living Expenses: Not on file  Food Insecurity:   . Worried About Charity fundraiser in the Last Year: Not on file  . Ran Out of Food in the Last Year: Not on file  Transportation Needs:   .  Lack of Transportation (Medical): Not on file  . Lack of Transportation (Non-Medical): Not on file  Physical Activity:   . Days of Exercise per Week: Not on file  . Minutes of Exercise per Session: Not on file  Stress:   . Feeling of Stress : Not on file  Social Connections:   . Frequency of Communication with Friends and Family: Not on file  . Frequency of Social Gatherings with Friends and Family: Not on file  . Attends Religious Services: Not on file  . Active Member of Clubs or Organizations: Not on file  .  Attends Archivist Meetings: Not on file  . Marital Status: Not on file    Review of Systems: A 12 point ROS discussed and pertinent positives are indicated in the HPI above.  All other systems are negative.  Review of Systems  Vital Signs: BP 100/72 (BP Location: Right Arm)   Pulse (!) 103   Temp 97.7 F (36.5 C) (Oral)   Resp 18   SpO2 94%   Physical Exam Constitutional:      Appearance: Normal appearance.  HENT:     Mouth/Throat:     Mouth: Mucous membranes are moist.     Pharynx: Oropharynx is clear.  Cardiovascular:     Rate and Rhythm: Normal rate and regular rhythm.     Heart sounds: Normal heart sounds.  Pulmonary:     Effort: Pulmonary effort is normal. No respiratory distress.     Breath sounds: Normal breath sounds.  Abdominal:     General: Abdomen is flat. There is no distension.     Palpations: Abdomen is soft.  Skin:    General: Skin is warm and dry.  Neurological:     General: No focal deficit present.     Mental Status: She is alert and oriented to person, place, and time.  Psychiatric:        Mood and Affect: Mood normal.        Thought Content: Thought content normal.       Imaging: DG Chest 2 View  Result Date: 09/29/2019 CLINICAL DATA:  Screening. Fall and loss of consciousness. EXAM: CHEST - 2 VIEW COMPARISON:  Chest CT 09/24/2019 FINDINGS: Right infrahilar opacity corresponds to pulmonary mass on CT. Adjacent streaky  opacities likely postobstructive atelectasis. Left lung is clear. Normal heart size. No pneumothorax or pleural fluid. No acute osseous abnormalities are seen. There are no radiopaque foreign bodies or medical device in the chest. Multiple overlying EKG leads. IMPRESSION: Right infrahilar opacity corresponds to pulmonary mass on CT. Adjacent streaky opacities likely postobstructive atelectasis. No radiopaque foreign body or metallic device to preclude MRI imaging. Electronically Signed   By: Keith Rake M.D.   On: 09/29/2019 02:00   CT Head Wo Contrast  Result Date: 09/29/2019 CLINICAL DATA:  Neck trauma.  Pain status post fall. EXAM: CT HEAD WITHOUT CONTRAST CT CERVICAL SPINE WITHOUT CONTRAST TECHNIQUE: Multidetector CT imaging of the head and cervical spine was performed following the standard protocol without intravenous contrast. Multiplanar CT image reconstructions of the cervical spine were also generated. COMPARISON:  None. FINDINGS: CT HEAD FINDINGS Brain: There is extensive vasogenic edema involving the left cerebellum there is suggestion of an underlying mass measuring approximately 1.8 cm. There is vasogenic edema in the right occipital lobe. There may be some vasogenic edema within the right frontal parietal lobe. There is no midline shift. No acute intracranial hemorrhage. There is some artifact involving the low anterior frontal lobe, less likely acute subarachnoid hemorrhage. Vascular: No hyperdense vessel or unexpected calcification. Skull: Normal. Negative for fracture or focal lesion.There is right posterior scalp swelling without evidence for an underlying fracture. Sinuses/Orbits: There is mild mucosal thickening of the maxillary sinuses bilaterally. The remaining paranasal sinuses and mastoid air cells are essentially clear. Other: None. CT CERVICAL SPINE FINDINGS Alignment: There is straightening of the normal cervical lordotic curvature. Skull base and vertebrae: No acute fracture. No  primary bone lesion or focal pathologic process. Soft tissues and spinal canal: No prevertebral fluid or swelling. No visible canal hematoma. Disc levels: The  disc heights are relatively well preserved throughout the cervical spine. Upper chest: Negative. Other: There is an enlarged left supraclavicular lymph node. Atherosclerotic changes are noted of the carotid arteries. IMPRESSION: 1. No acute intracranial hemorrhage. 2. No acute cervical spine fracture. 3. Findings concerning for metastatic disease involving the left cerebellum and right occipital lobe as detailed above. Follow-up with a contrast-enhanced MRI is recommended. 4. Enlarged left supraclavicular lymph node. Electronically Signed   By: Constance Holster M.D.   On: 09/29/2019 01:46   CT Chest W Contrast  Result Date: 09/24/2019 CLINICAL DATA:  Persistent cough. Hemoptysis. Abnormal chest radiograph with right perihilar opacity. Former smoker per epic. EXAM: CT CHEST WITH CONTRAST TECHNIQUE: Multidetector CT imaging of the chest was performed during intravenous contrast administration. CONTRAST:  52mL OMNIPAQUE IOHEXOL 300 MG/ML  SOLN COMPARISON:  09/08/2019 chest radiograph. FINDINGS: Cardiovascular: Normal heart size. No significant pericardial effusion/thickening. Left anterior descending coronary atherosclerosis. Atherosclerotic nonaneurysmal thoracic aorta. Normal caliber pulmonary arteries. No central pulmonary emboli. Mediastinum/Nodes: No discrete thyroid nodules. Unremarkable esophagus. Enlarged 1.1 cm right prevascular node (series 3/image 61). Right paratracheal adenopathy up to 2.2 cm (series 3/image 72). Enlarged 2.5 cm subcarinal node (series 3/image 90). Enlarged 1.5 cm left paratracheal node (series 3/image 73). Right hilar adenopathy up to 1.4 cm (series 3/image 98). No pathologically enlarged left hilar nodes. No axillary adenopathy. Lungs/Pleura: No pneumothorax. Trace dependent right pleural effusion. No left pleural effusion.  Poorly marginated cavitary 5.7 x 4.3 cm central right lower lobe lung mass (series 5/image 94) abutting and distorting the right major fissure, associated with high-grade stenosis of the central right lower lobe airways. Patchy consolidation and ground-glass opacity in the right lower lobe peripheral to the central lung mass compatible with postobstructive pneumonia. A few scattered small solid pulmonary nodules in both lungs, largest 6 mm in the peripheral right middle lobe (series 5/image 97) and 3 mm in the left lower lobe (series 5/image 92). Upper abdomen: Cholecystectomy. Musculoskeletal: No aggressive appearing focal osseous lesions. Mild thoracic spondylosis. IMPRESSION: 1. Poorly marginated cavitary 5.7 cm central right lower lobe lung mass associated with high-grade stenosis of the central right lower lobe airways, suspicious for primary bronchogenic carcinoma. 2. Postobstructive pneumonia in the peripheral right lower lobe. 3. Right hilar, subcarinal, bilateral paratracheal and right prevascular mediastinal lymphadenopathy, suspicious for metastatic disease. 4. Several subcentimeter solid pulmonary nodules in both lungs, cannot exclude pulmonary metastases. 5. Trace dependent right pleural effusion. 6. Suggest staging PET-CT and multi disciplinary thoracic oncology consultation. Aortic Atherosclerosis (ICD10-I70.0). These results will be called to the ordering clinician or representative by the Radiology Department at the imaging location. Electronically Signed   By: Ilona Sorrel M.D.   On: 09/24/2019 12:50   CT Cervical Spine Wo Contrast  Result Date: 09/29/2019 CLINICAL DATA:  Neck trauma.  Pain status post fall. EXAM: CT HEAD WITHOUT CONTRAST CT CERVICAL SPINE WITHOUT CONTRAST TECHNIQUE: Multidetector CT imaging of the head and cervical spine was performed following the standard protocol without intravenous contrast. Multiplanar CT image reconstructions of the cervical spine were also generated.  COMPARISON:  None. FINDINGS: CT HEAD FINDINGS Brain: There is extensive vasogenic edema involving the left cerebellum there is suggestion of an underlying mass measuring approximately 1.8 cm. There is vasogenic edema in the right occipital lobe. There may be some vasogenic edema within the right frontal parietal lobe. There is no midline shift. No acute intracranial hemorrhage. There is some artifact involving the low anterior frontal lobe, less likely acute subarachnoid hemorrhage. Vascular:  No hyperdense vessel or unexpected calcification. Skull: Normal. Negative for fracture or focal lesion.There is right posterior scalp swelling without evidence for an underlying fracture. Sinuses/Orbits: There is mild mucosal thickening of the maxillary sinuses bilaterally. The remaining paranasal sinuses and mastoid air cells are essentially clear. Other: None. CT CERVICAL SPINE FINDINGS Alignment: There is straightening of the normal cervical lordotic curvature. Skull base and vertebrae: No acute fracture. No primary bone lesion or focal pathologic process. Soft tissues and spinal canal: No prevertebral fluid or swelling. No visible canal hematoma. Disc levels: The disc heights are relatively well preserved throughout the cervical spine. Upper chest: Negative. Other: There is an enlarged left supraclavicular lymph node. Atherosclerotic changes are noted of the carotid arteries. IMPRESSION: 1. No acute intracranial hemorrhage. 2. No acute cervical spine fracture. 3. Findings concerning for metastatic disease involving the left cerebellum and right occipital lobe as detailed above. Follow-up with a contrast-enhanced MRI is recommended. 4. Enlarged left supraclavicular lymph node. Electronically Signed   By: Constance Holster M.D.   On: 09/29/2019 01:46   MR BRAIN W WO CONTRAST  Result Date: 09/30/2019 CLINICAL DATA:  Metastatic malignant neoplasm, unspecified site. Additional history provided: Patient with history of lung  cancer EXAM: MRI HEAD WITHOUT AND WITH CONTRAST TECHNIQUE: Multiplanar, multiecho pulse sequences of the brain and surrounding structures were obtained without and with intravenous contrast. CONTRAST:  7.75mL GADAVIST GADOBUTROL 1 MMOL/ML IV SOLN COMPARISON:  Head CT 09/29/2019 FINDINGS: Brain: There are multiple enhancing intracranial metastases as follows. An index peripherally enhancing metastasis within the paramedian left cerebellum measures 1.6 x 1.3 x 1.5 cm (series 12, image 37). Moderate surrounding edema. 4 mm lesion within the right cerebellum (series 12, image 37). Mild surrounding edema. Punctate lesion within the midline inferior cerebellum (series 12, image 34). 5 mm peripherally enhancing lesion within the right occipital lobe (series 12, image 61). Mild surrounding edema. 2 mm lesion within the right parietal lobe (series 12, image 86). Minimal associated edema. 3 mm peripherally enhancing lesion within right parietal lobe (series 12, image 101). Minimal associated edema. Punctate lesion within the left precentral gyrus (series 12, image 118). Mild surrounding edema. 3 mm lesion within the anterior left frontal lobe (series 12, image 121). An additional 2 mm peripherally enhancing metastasis is questioned within the quadrigeminal plate cistern along the internal cerebral veins (series 12, image 75), although this is not definite. Posterior fossa mass effect related to the moderate edema associated with the dominant left cerebellar metastasis. Partial effacement of the fourth ventricle. No evidence of hydrocephalus on the current examination. No evidence of acute infarct. No supratentorial midline shift. No extra-axial fluid collection. No chronic intracranial blood products. Additional mild scattered T2/FLAIR hyperintensity within the cerebral white matter is nonspecific, but consistent with chronic small vessel ischemic disease. Cerebral volume is normal for age. Vascular: Flow voids maintained  within the proximal large arterial vessels. 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 3 mm enhancing focus within right occipital calvarium which appears to demonstrate precontrast T1 hyperintensity and is likely benign. Sinuses/Orbits: Visualized orbits demonstrate no acute abnormality. Left maxillary sinus mucous retention cyst. Minimal ethmoid sinus mucosal thickening. No significant mastoid effusion. These results will be called to the ordering clinician or representative by the Radiologist Assistant, and communication documented in the PACS or zVision Dashboard. IMPRESSION: There are least 8 metastatic  lesions within the supratentorial and infratentorial brain as outlined. An index peripherally enhancing mass within the left cerebellum measures 1.6 x 1.3 x 1.5 cm. Moderate surrounding edema. Resultant posterior fossa mass effect with partial effacement of the fourth ventricle. No evidence of hydrocephalus. 10 mm focus of enhancement within the left parietal calvarium with adjacent focal dural thickening and enhancement. 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 Mild chronic small vessel ischemic disease. Electronically Signed   By: Kellie Simmering DO   On: 09/30/2019 16:04   MR THORACIC SPINE W WO CONTRAST  Result Date: 10/20/2019 CLINICAL DATA:  Back pain. 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 EXAM: MRI THORACIC AND LUMBAR SPINE WITHOUT AND WITH CONTRAST TECHNIQUE: Multiplanar and multiecho pulse sequences of the thoracic and lumbar spine were obtained without and with intravenous contrast. CONTRAST:  98mL GADAVIST GADOBUTROL 1 MMOL/ML IV SOLN COMPARISON:  PET scan dated 10/01/2019 FINDINGS: MRI THORACIC SPINE FINDINGS Alignment:  Physiologic.  Vertebrae: There are metastatic lesions involving the 7 and T8 vertebra. Tumor extends into the left side of the spinal canal extending from T6-7 disc space to the T8-9 disc space with a slight mass effect upon the left side of the thecal sac. The tumor extends into the left neural foramina at T6-7, T7-8 and T8-9. Tumor extends into the left pedicle of T8 and into the paraspinal soft tissues to the left of midline. There are small lesions in the right transverse process of T2 and in the right transverse processes of T10 and T11. There is a tiny lesion in the T3 vertebral body on STIR imaging which does not enhance after contrast. There is also a lesion in the superior aspect of L2. Cord: No myelopathy. The tumor in the left side of the spinal canal does not affect the spinal cord. Paraspinal and other soft tissues: There is abnormal enhancement of the paraspinal soft tissues to the left of midline at T7-8. There is a large mass in the right lower lobe. There is mediastinal adenopathy. Disc levels: There are no disc protrusions or significant disc bulges. No spinal or foraminal stenoses. MRI LUMBAR SPINE FINDINGS Segmentation:  Standard. Alignment:  Physiologic. Vertebrae: There is a 2 cm metastatic lesion in the superior aspect of L2 without a pathologic fracture. There is only slight enhancement of the lesion after contrast administration. There is a 19 mm lesion in the posterior aspect of the right iliac bone adjacent to the sacroiliac joint. Conus medullaris: Extends to the L1-2 level and appears normal. Paraspinal and other soft tissues: Negative. Disc levels: L1-2: The disc is normal. The tumor involves the superior aspect of L2 without a pathologic fracture. No extension of tumor into the spinal canal. L2-3: Normal. L3-4: Tiny broad-based disc bulge with no neural impingement. Slight left facet arthritis. L4-5: Tiny broad-based disc bulge. Moderate bilateral facet arthritis. L5-S1: Soft disc extrusion to the  right of midline extending inferiorly behind the body of L5 on the right slightly compressing the right S1 nerve root sleeve. IMPRESSION: 1. Multiple metastatic lesions in the thoracic and lumbar spine as well as in the right iliac bone. 2. Tumor extends into the left side of the spinal canal and into left neural foramina at T6-7, T7-8 and T8-9 with a slight mass effect upon the thecal sac at those levels. 3. No tumor extension into the spinal canal or neural foramina at the other levels of the thoracic or lumbar spine. 4. Large  mass in the right lower lobe with mediastinal adenopathy. Electronically Signed   By: Lorriane Shire M.D.   On: 10/20/2019 16:52   MR Lumbar Spine W Wo Contrast  Result Date: 10/20/2019 CLINICAL DATA:  Back pain. 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 EXAM: MRI THORACIC AND LUMBAR SPINE WITHOUT AND WITH CONTRAST TECHNIQUE: Multiplanar and multiecho pulse sequences of the thoracic and lumbar spine were obtained without and with intravenous contrast. CONTRAST:  31mL GADAVIST GADOBUTROL 1 MMOL/ML IV SOLN COMPARISON:  PET scan dated 10/01/2019 FINDINGS: MRI THORACIC SPINE FINDINGS Alignment:  Physiologic. Vertebrae: There are metastatic lesions involving the 7 and T8 vertebra. Tumor extends into the left side of the spinal canal extending from T6-7 disc space to the T8-9 disc space with a slight mass effect upon the left side of the thecal sac. The tumor extends into the left neural foramina at T6-7, T7-8 and T8-9. Tumor extends into the left pedicle of T8 and into the paraspinal soft tissues to the left of midline. There are small lesions in the right transverse process of T2 and in the right transverse processes of T10 and T11. There is a tiny lesion in the T3 vertebral body on STIR imaging which does not enhance after contrast. There is also a lesion in the superior aspect of L2. Cord: No myelopathy. The tumor in the left side of the spinal  canal does not affect the spinal cord. Paraspinal and other soft tissues: There is abnormal enhancement of the paraspinal soft tissues to the left of midline at T7-8. There is a large mass in the right lower lobe. There is mediastinal adenopathy. Disc levels: There are no disc protrusions or significant disc bulges. No spinal or foraminal stenoses. MRI LUMBAR SPINE FINDINGS Segmentation:  Standard. Alignment:  Physiologic. Vertebrae: There is a 2 cm metastatic lesion in the superior aspect of L2 without a pathologic fracture. There is only slight enhancement of the lesion after contrast administration. There is a 19 mm lesion in the posterior aspect of the right iliac bone adjacent to the sacroiliac joint. Conus medullaris: Extends to the L1-2 level and appears normal. Paraspinal and other soft tissues: Negative. Disc levels: L1-2: The disc is normal. The tumor involves the superior aspect of L2 without a pathologic fracture. No extension of tumor into the spinal canal. L2-3: Normal. L3-4: Tiny broad-based disc bulge with no neural impingement. Slight left facet arthritis. L4-5: Tiny broad-based disc bulge. Moderate bilateral facet arthritis. L5-S1: Soft disc extrusion to the right of midline extending inferiorly behind the body of L5 on the right slightly compressing the right S1 nerve root sleeve. IMPRESSION: 1. Multiple metastatic lesions in the thoracic and lumbar spine as well as in the right iliac bone. 2. Tumor extends into the left side of the spinal canal and into left neural foramina at T6-7, T7-8 and T8-9 with a slight mass effect upon the thecal sac at those levels. 3. No tumor extension into the spinal canal or neural foramina at the other levels of the thoracic or lumbar spine. 4. Large mass in the right lower lobe with mediastinal adenopathy. Electronically Signed   By: Lorriane Shire M.D.   On: 10/20/2019 16:52   NM PET Image Initial (PI) Skull Base To Thigh  Result Date: 10/01/2019 CLINICAL  DATA:  Initial treatment strategy for pulmonary mass. EXAM: NUCLEAR MEDICINE PET SKULL BASE TO THIGH TECHNIQUE: 8.1 mCi F-18 FDG was injected intravenously. Full-ring PET imaging was  performed from the skull base to thigh after the radiotracer. CT data was obtained and used for attenuation correction and anatomic localization. Fasting blood glucose: 125 mg/dl COMPARISON:  Chest CT 09/24/2019 FINDINGS: Mediastinal blood pool activity: SUV max 2.7 Liver activity: SUV max NA NECK: Lower left cervical node is hypermetabolic with SUV max = 7.5. Incidental CT findings: none CHEST: Hypermetabolic lymphadenopathy is identified in the right axilla, right thoracic inlet, mediastinum, and right hilum. Index right paratracheal node on 78/3 measures 2.6 cm short axis with SUV max = 10.5. Index subcarinal lymph node on 89/3 measures 2.3 cm with SUV max = 9.8. Left hilar lymphadenopathy is hypermetabolic with SUV max = 9.1. Right infrahilar mass lesion better characterized on recent diagnostic chest CT is hypermetabolic with SUV max = 91.4. 9 mm posterior right lower lobe nodule on 87/3 shows no discernible hypermetabolism on PET imaging. Incidental CT findings: Heart is enlarged. Trace pericardial effusion. Coronary artery calcification is evident. Atherosclerotic calcification is noted in the wall of the thoracic aorta. Please see report for recent diagnostic CT of the chest. ABDOMEN/PELVIS: No abnormal hypermetabolic activity within the liver, pancreas, adrenal glands, or spleen. No hypermetabolic lymph nodes in the abdomen or pelvis. Incidental CT findings: Gallbladder is surgically absent.There is abdominal aortic atherosclerosis without aneurysm. SKELETON: Scattered hypermetabolic bone metastases are identified including posterior right second rib ( SUV max = 4.6), left iliac crest (SUV max = 4.6), and anterior right iliac bone ( SUV max = 5.5). Hypermetabolic subcutaneous nodules are identified. 5 mm subcutaneous nodule  lateral left abdominal wall (147/3) demonstrates SUV max = 1.9. 5 mm subcutaneous nodule superficial to the right rectus sheath on image 194/3 demonstrates SUV max = 2.3. 7 mm subcutaneous nodule lateral right hip on 246/3 demonstrates SUV max = 3.4. Incidental CT findings: Above described hypermetabolic bone lesion show no CT correlate. IMPRESSION: 1. Markedly hypermetabolic infrahilar right lower lobe mass with hypermetabolic nodal metastases in the right hilum, mediastinum, right supraclavicular region, left axilla and lower left neck. 2. Hypermetabolic rib, vertebral body, and iliac bone lesions consistent with metastatic involvement. 3. Tiny hypermetabolic subcutaneous nodules also concerning for metastatic disease. 4.  Aortic Atherosclerois (ICD10-170.0) Electronically Signed   By: Misty Stanley M.D.   On: 10/01/2019 15:31   [name] CORE BIOPSY (LYMPH NODES)  Result Date: 10/08/2019 INDICATION: 64 year old with right lung mass and lymphadenopathy. Tissue diagnosis is needed. Patient has a hypermetabolic left cervical lymph node on recent PET-CT. EXAM: ULTRASOUND-GUIDED CORE BIOPSY OF LEFT CERVICAL LYMPH NODE MEDICATIONS: None. ANESTHESIA/SEDATION: None FLUOROSCOPY TIME:  None COMPLICATIONS: None immediate. PROCEDURE: The procedure was explained to the patient. The risks and benefits of the procedure were discussed and the patient's questions were addressed. Informed consent was obtained from the patient. The left side of the neck was evaluated with ultrasound. Suspicious hypoechoic lymph node was identified just lateral to the left common carotid artery. Skin was prepped with chlorhexidine and sterile field was created. Skin and soft tissues were anesthetized with 1% lidocaine. Using ultrasound guidance, 18 gauge core device was directed into the lymph node. Total of 4 core biopsies were obtained. Specimens placed in saline. Bandage placed over the puncture site. FINDINGS: Suspicious hypoechoic lymph node just  posterior to the left internal jugular vein and lateral to the left common carotid artery. Lymph node measures 1.0 cm in the short axis. This lymph node corresponds with the hypermetabolic lymph node on recent PET-CT. Four adequate core biopsies were obtained. No significant bleeding or hematoma formation  following the core biopsies. IMPRESSION: Ultrasound-guided core biopsies of the suspicious left cervical lymph node. Electronically Signed   By: Markus Daft M.D.   On: 10/08/2019 14:37    Labs:  CBC: Recent Labs    09/29/19 0120  WBC 10.5  HGB 12.0  HCT 36.7  PLT 392    COAGS: No results for input(s): INR, APTT in the last 8760 hours.  BMP: Recent Labs    09/24/19 1141 09/29/19 0120  NA  --  141  K  --  3.0*  CL  --  103  CO2  --  22  GLUCOSE  --  163*  BUN  --  11  CALCIUM  --  9.3  CREATININE 0.50 0.64  GFRNONAA  --  >60  GFRAA  --  >60    LIVER FUNCTION TESTS: Recent Labs    09/29/19 0120  BILITOT 0.3  AST 22  ALT 24  ALKPHOS 88  PROT 6.6  ALBUMIN 3.4*    TUMOR MARKERS: No results for input(s): AFPTM, CEA, CA199, CHROMGRNA in the last 8760 hours.  Assessment and Plan: Metastatic lung cancer For port placement Labs ok Risks and benefits of image guided port-a-catheter placement was discussed with the patient including, but not limited to bleeding, infection, pneumothorax, or fibrin sheath development and need for additional procedures.  All of the patient's questions were answered, patient is agreeable to proceed. Consent signed and in chart.    Thank you for this interesting consult.  I greatly enjoyed meeting Virginia [name] and look forward to participating in their care.  A copy of this report was sent to the requesting provider on this date.  Electronically Signed: Ascencion Dike, [name] 10/23/2019, 9:09 AM   I spent a total of 20 minutes in face to face in clinical consultation, greater than 50% of which was counseling/coordinating care for  port

## 2019-10-23 NOTE — Sedation Documentation (Signed)
Room air sats were 88%

## 2019-10-23 NOTE — Telephone Encounter (Signed)
Virginia [name] [phone]  [name] called to see if she could get something for hemorrhoids called in to her pharmacy.  Hydrocort Pramoxine Cream  [place]

## 2019-10-24 ENCOUNTER — Emergency Department (HOSPITAL_COMMUNITY): Payer: BC Managed Care – PPO

## 2019-10-24 ENCOUNTER — Other Ambulatory Visit: Payer: Self-pay

## 2019-10-24 ENCOUNTER — Encounter (HOSPITAL_COMMUNITY): Payer: Self-pay

## 2019-10-24 ENCOUNTER — Emergency Department (HOSPITAL_COMMUNITY)
Admission: EM | Admit: 2019-10-24 | Discharge: 2019-10-24 | Disposition: A | Discharge status: Home / Self Care | Payer: BC Managed Care – PPO | Attending: Emergency Medicine | Admitting: Emergency Medicine

## 2019-10-24 DIAGNOSIS — R0902 Hypoxemia: Secondary | ICD-10-CM | POA: Diagnosis not present

## 2019-10-24 DIAGNOSIS — Z79899 Other long term (current) drug therapy: Secondary | ICD-10-CM | POA: Diagnosis not present

## 2019-10-24 DIAGNOSIS — Z87891 Personal history of nicotine dependence: Secondary | ICD-10-CM | POA: Insufficient documentation

## 2019-10-24 DIAGNOSIS — J449 Chronic obstructive pulmonary disease, unspecified: Secondary | ICD-10-CM | POA: Diagnosis not present

## 2019-10-24 DIAGNOSIS — I10 Essential (primary) hypertension: Secondary | ICD-10-CM | POA: Diagnosis not present

## 2019-10-24 DIAGNOSIS — Z7982 Long term (current) use of aspirin: Secondary | ICD-10-CM | POA: Diagnosis not present

## 2019-10-24 DIAGNOSIS — R1031 Right lower quadrant pain: Secondary | ICD-10-CM | POA: Diagnosis not present

## 2019-10-24 DIAGNOSIS — R1084 Generalized abdominal pain: Secondary | ICD-10-CM | POA: Diagnosis not present

## 2019-10-24 DIAGNOSIS — K5900 Constipation, unspecified: Secondary | ICD-10-CM | POA: Diagnosis not present

## 2019-10-24 DIAGNOSIS — G4489 Other headache syndrome: Secondary | ICD-10-CM | POA: Diagnosis not present

## 2019-10-24 DIAGNOSIS — R1032 Left lower quadrant pain: Secondary | ICD-10-CM | POA: Diagnosis not present

## 2019-10-24 DIAGNOSIS — R339 Retention of urine, unspecified: Secondary | ICD-10-CM

## 2019-10-24 DIAGNOSIS — R52 Pain, unspecified: Secondary | ICD-10-CM | POA: Diagnosis not present

## 2019-10-24 DIAGNOSIS — J181 Lobar pneumonia, unspecified organism: Secondary | ICD-10-CM | POA: Diagnosis not present

## 2019-10-24 DIAGNOSIS — R1030 Lower abdominal pain, unspecified: Secondary | ICD-10-CM

## 2019-10-24 DIAGNOSIS — R109 Unspecified abdominal pain: Secondary | ICD-10-CM | POA: Diagnosis not present

## 2019-10-24 HISTORY — DX: Malignant (primary) neoplasm, unspecified: C80.1

## 2019-10-24 LAB — URINALYSIS, ROUTINE W REFLEX MICROSCOPIC
Bacteria, UA: NONE SEEN
Bilirubin Urine: NEGATIVE
Glucose, UA: NEGATIVE mg/dL
Ketones, ur: NEGATIVE mg/dL
Nitrite: NEGATIVE
Protein, ur: NEGATIVE mg/dL
Specific Gravity, Urine: 1.026 (ref 1.005–1.030)
pH: 7 (ref 5.0–8.0)

## 2019-10-24 LAB — CBC WITH DIFFERENTIAL/PLATELET
Abs Immature Granulocytes: 0.17 10*3/uL — ABNORMAL HIGH (ref 0.00–0.07)
Basophils Absolute: 0 10*3/uL (ref 0.0–0.1)
Basophils Relative: 0 %
Eosinophils Absolute: 0 10*3/uL (ref 0.0–0.5)
Eosinophils Relative: 0 %
HCT: 37.9 % (ref 36.0–46.0)
Hemoglobin: 12.8 g/dL (ref 12.0–15.0)
Immature Granulocytes: 1 %
Lymphocytes Relative: 8 %
Lymphs Abs: 1.4 10*3/uL (ref 0.7–4.0)
MCH: 30.7 pg (ref 26.0–34.0)
MCHC: 33.8 g/dL (ref 30.0–36.0)
MCV: 90.9 fL (ref 80.0–100.0)
Monocytes Absolute: 1.2 10*3/uL — ABNORMAL HIGH (ref 0.1–1.0)
Monocytes Relative: 7 %
Neutro Abs: 14.6 10*3/uL — ABNORMAL HIGH (ref 1.7–7.7)
Neutrophils Relative %: 84 %
Platelets: 336 10*3/uL (ref 150–400)
RBC: 4.17 MIL/uL (ref 3.87–5.11)
RDW: 12 % (ref 11.5–15.5)
WBC: 17.5 10*3/uL — ABNORMAL HIGH (ref 4.0–10.5)
nRBC: 0 % (ref 0.0–0.2)

## 2019-10-24 LAB — COMPREHENSIVE METABOLIC PANEL
ALT: 134 U/L — ABNORMAL HIGH (ref 0–44)
AST: 57 U/L — ABNORMAL HIGH (ref 15–41)
Albumin: 3 g/dL — ABNORMAL LOW (ref 3.5–5.0)
Alkaline Phosphatase: 147 U/L — ABNORMAL HIGH (ref 38–126)
Anion gap: 12 (ref 5–15)
BUN: 11 mg/dL (ref 8–23)
CO2: 29 mmol/L (ref 22–32)
Calcium: 9.1 mg/dL (ref 8.9–10.3)
Chloride: 96 mmol/L — ABNORMAL LOW (ref 98–111)
Creatinine, Ser: 0.43 mg/dL — ABNORMAL LOW (ref 0.44–1.00)
GFR calc Af Amer: >60 mL/min (ref >60)
GFR calc non Af Amer: >60 mL/min (ref >60)
Glucose, Bld: 174 mg/dL — ABNORMAL HIGH (ref 70–99)
Potassium: 3.7 mmol/L (ref 3.5–5.1)
Sodium: 137 mmol/L (ref 135–145)
Total Bilirubin: 0.5 mg/dL (ref 0.3–1.2)
Total Protein: 7.6 g/dL (ref 6.5–8.1)

## 2019-10-24 LAB — LIPASE, BLOOD: Lipase: 16 U/L (ref 11–51)

## 2019-10-24 LAB — MAGNESIUM: Magnesium: 2.3 mg/dL (ref 1.7–2.4)

## 2019-10-24 MED ORDER — KETOROLAC TROMETHAMINE 30 MG/ML IJ SOLN
15.0000 mg | Freq: Once | INTRAMUSCULAR | Status: AC
  Administered 2019-10-24: 15 mg via INTRAVENOUS
  Filled 2019-10-24: qty 1

## 2019-10-24 MED ORDER — HYDROCORTISONE (PERIANAL) 2.5 % EX CREA: 1.0000 "application " | TOPICAL_CREAM | Freq: Two times a day (BID) | CUTANEOUS | 0 refills | Status: DC

## 2019-10-24 MED ORDER — IOHEXOL 300 MG/ML  SOLN
100.0000 mL | Freq: Once | INTRAMUSCULAR | Status: AC | PRN
  Administered 2019-10-24: 100 mL via INTRAVENOUS

## 2019-10-24 MED ORDER — SODIUM CHLORIDE (PF) 0.9 % IJ SOLN
INTRAMUSCULAR | Status: AC
  Filled 2019-10-24: qty 50

## 2019-10-24 MED ORDER — SODIUM CHLORIDE 0.9 % IV BOLUS
1000.0000 mL | Freq: Once | INTRAVENOUS | Status: AC
  Administered 2019-10-24: 1000 mL via INTRAVENOUS

## 2019-10-24 NOTE — Discharge Instructions (Signed)
Take 8 scoops of miralax in 32oz of whatever you would like to drink.(Gatorade comes in this size) You can also use a fleets enema which you can buy over the counter at the pharmacy.  Return for worsening abdominal pain, vomiting or fever. ? ?

## 2019-10-24 NOTE — ED Notes (Signed)
Tried to bladder scan pt and was not successful.

## 2019-10-24 NOTE — ED Triage Notes (Signed)
EMS reports from home, [place] Pt, Port A Cath inserted yesterday, called out for constipation x 8 days without BM, abdominal distention and dysuria today. Pt took Senakot, Miralax and MagCit today since 7 am without result. Pt states she took 2 Tylenol at noon.  BP 150/88 HR 92 RR 18 Sp02 98 on 2 Ltrs Temp 95.9 Temporal.

## 2019-10-24 NOTE — ED Notes (Signed)
Patient urinated on her clothing and the temperature outside is freezing. Awaiting arrival of her husband with dry clothing before fully discharging patient.

## 2019-10-24 NOTE — ED Provider Notes (Signed)
[facility] DEPT Provider Note   CSN: [id number] Arrival date & time: 10/24/19  1746     History Chief Complaint  Patient presents with  . Constipation  . Dysuria    Virginia [name] is a 64 y.o. female.  64 yo female with a chief complaints of constipation.  Been going on for just over a week.  Has not had a bowel movement.  Has been able to eat and drink.  Started having severe bloating and crampy lower abdominal pain.  She has got to the point today where she felt like she has been unable to urinate all day.  Has hemorrhoids which she thinks is gotten worse with her constipation.  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  She had a port placed yesterday.  Plans to start chemotherapy next week.  She was told to try MiraLAX and magnesium citrate which she did without improvement.  She is instructed to increase her dose at home and she declined and came to the ED for evaluation.  Reportedly was hypoxic by EMS.  She reports no shortness of breath no chest pain has a chronic cough that is unchanged.  The history is provided by the patient.  Constipation Associated symptoms: abdominal pain and dysuria   Associated symptoms: no fever, no nausea and no vomiting   Dysuria Associated symptoms: abdominal pain   Associated symptoms: no fever, no nausea and no vomiting   Illness Severity:  Moderate Onset quality:  Gradual Duration:  8 days Timing:  Constant Progression:  Worsening Chronicity:  New Associated symptoms: abdominal pain and cough (chronic and unchanged)   Associated symptoms: no chest pain, no congestion, no fever, no headaches, no myalgias, no nausea, no rhinorrhea, no shortness of breath, no vomiting and no wheezing        Past Medical History:  Diagnosis Date  . Anxiety   . Cancer ([place])   . Dyspnea   . GERD (gastroesophageal reflux disease)   . Hypercholesterolemia    per pt, she does not  have elevated lipids  . Hypertension   . Tobacco abuse     Patient Active Problem List   Diagnosis Date Noted  . Bone metastases ([place]) 10/21/2019  . Brain metastases ([place]) 10/15/2019  . Right lower lobe lung mass 09/25/2019  . COPD (chronic obstructive pulmonary disease) ([place]) 09/25/2019  . Impaired glucose tolerance 12/08/2017  . Vitamin D deficiency 12/08/2017  . Microscopic hematuria 04/01/2014  . Hypertension 01/11/2012  . Palpitations 01/11/2012  . Abnormal ECG 12/26/2011  . Hyperlipidemia 12/25/2011  . History of smoking 12/25/2011  . Anxiety 04/29/2011    Past Surgical History:  Procedure Laterality Date  . ABDOMINAL HYSTERECTOMY     partial/ left ovaries  . CHOLECYSTECTOMY    . DILATION AND CURETTAGE OF UTERUS    . IR IMAGING GUIDED PORT INSERTION  10/23/2019  . LIPOMA EXCISION  2018   removed under left breast and right thigh.  . TUBAL LIGATION       OB History   No obstetric history on file.     Family History  Problem Relation Age of Onset  . Heart disease Father   . Drug abuse Daughter   . Drug abuse Son   . Cancer Sister   . Heart disease Brother   . Heart attack Brother     Social History   Tobacco Use  . Smoking status: Former Smoker    Packs/day: 0.25  Quit date: 10/31/2012    Years since quitting: 6.9  . Smokeless tobacco: Never Used  Substance Use Topics  . Alcohol use: Yes    Alcohol/week: 0.0 standard drinks    Comment: rare  . Drug use: No    Home Medications Prior to Admission medications   Medication Sig Start Date End Date Taking? Authorizing Provider  acetaminophen (TYLENOL) 500 MG tablet Take 500-1,000 mg by mouth every 6 (six) hours as needed (for pain.).    [provider]  albuterol (VENTOLIN HFA) 108 (90 Base) MCG/ACT inhaler Inhale 2 puffs into the lungs every 6 (six) hours as needed for wheezing or shortness of breath. 10/15/19   Elby Showers, MD  ALPRAZolam Duanne Moron) 0.25 MG tablet TAKE 1 TABLET BY MOUTH  TWICE DAILY AS NEEDED FOR ANXIETY 01/06/19   Elby Showers, MD  APPLE CIDER VINEGAR PO Take 450 mg by mouth daily.    [provider]  aspirin 325 MG EC tablet Take 325 mg by mouth every 8 (eight) hours as needed for pain.    [provider]  Budeson-Glycopyrrol-Formoterol (BREZTRI AEROSPHERE) 160-9-4.8 MCG/ACT AERO Inhale 2 puffs into the lungs 2 (two) times daily.    [provider]  Carboxymethylcellul-Glycerin (CLEAR EYES FOR DRY EYES) 1-0.25 % SOLN Place 1 drop into both eyes 3 (three) times daily as needed.    [provider]  cyclobenzaprine (FLEXERIL) 10 MG tablet Take 1 tablet (10 mg total) by mouth 3 (three) times daily as needed for muscle spasms. 10/23/19   Orson Slick, MD  dexamethasone (DECADRON) 2 MG tablet Take 1 tablet (2 mg total) by mouth 2 (two) times daily. Patient taking differently: Take 2 mg by mouth daily.  09/30/19   Collene Gobble, MD  ELDERBERRY PO Take 1,250 mg by mouth daily.    [provider]  hydrochlorothiazide (HYDRODIURIL) 25 MG tablet TAKE 1 TABLET(25 MG) BY MOUTH DAILY Patient taking differently: Take 25 mg by mouth daily.  08/12/19   Elby Showers, MD  hydrocortisone (ANUSOL-HC) 2.5 % rectal cream Place 1 application rectally 2 (two) times daily. 10/24/19   Deno Etienne, DO  L-Lysine 500 MG TABS Take 500 mg by mouth 2 (two) times daily as needed (cold sores/fever blisters.).    [provider]  losartan (COZAAR) 100 MG tablet TAKE 1 TABLET(100 MG) BY MOUTH DAILY Patient taking differently: Take 100 mg by mouth daily. TAKE 1 TABLET(100 MG) BY MOUTH DAILY 08/12/19   Elby Showers, MD  ondansetron (ZOFRAN) 4 MG tablet Take 1 tablet (4 mg total) by mouth every 8 (eight) hours as needed for nausea or vomiting. Patient not taking: Reported on 10/21/2019 09/29/19   Elby Showers, MD  OVER THE COUNTER MEDICATION Apply 1 application topically daily as needed (shoulder pain.). [place] Gel OTC    [provider]  oxyCODONE (OXY IR/ROXICODONE) 5 MG immediate release tablet Take 1-2 tablets (5-10 mg total) by mouth every 6 (six) hours as needed for severe pain or breakthrough pain. 10/23/19   Orson Slick, MD  pantoprazole (PROTONIX) 40 MG tablet TAKE 1 TABLET(40 MG) BY MOUTH DAILY 05/27/19   Elby Showers, MD  polyethylene glycol (MIRALAX / GLYCOLAX) 17 g packet Take 17 g by mouth daily.    [provider]  Probiotic Product (EQL PROBIOTIC COLON SUPPORT) CAPS Take 1 capsule by mouth daily.    [provider]  psyllium (METAMUCIL) 58.6 % powder Take 1 packet  by mouth daily.    [provider]  rosuvastatin (CRESTOR) 5 MG tablet TAKE 1 TABLET BY MOUTH THREE TIMES WEEKLY AS DIRECTED Patient taking differently: Take 5 mg by mouth every Monday, Wednesday, and Friday. TAKE 1 TABLET BY MOUTH THREE TIMES WEEKLY AS DIRECTED 09/25/19   Elby Showers, MD  Vitamin D3 (VITAMIN D) 25 MCG tablet Take 2,000 Units by mouth daily.    [provider]    Allergies    Patient has no known allergies.  Review of Systems   Review of Systems  Constitutional: Negative for chills and fever.  HENT: Negative for congestion and rhinorrhea.   Eyes: Negative for redness and visual disturbance.  Respiratory: Positive for cough (chronic and unchanged). Negative for shortness of breath and wheezing.   Cardiovascular: Negative for chest pain and palpitations.  Gastrointestinal: Positive for abdominal pain and constipation. Negative for nausea and vomiting.  Genitourinary: Positive for dysuria. Negative for urgency.  Musculoskeletal: Negative for arthralgias and myalgias.  Skin: Negative for pallor and wound.  Neurological: Negative for dizziness and headaches.    Physical Exam Updated Vital Signs BP (!) 160/94   Pulse 84   Temp 97.9 F (36.6 C) (Oral)   Resp 18   SpO2 95%   Physical Exam Vitals and nursing note reviewed.  Constitutional:      General: She is not in acute  distress.    Appearance: She is well-developed. She is not diaphoretic.  HENT:     Head: Normocephalic and atraumatic.  Eyes:     Pupils: Pupils are equal, round, and reactive to light.  Cardiovascular:     Rate and Rhythm: Normal rate and regular rhythm.     Heart sounds: No murmur. No friction rub. No gallop.   Pulmonary:     Effort: Pulmonary effort is normal.     Breath sounds: No wheezing or rales.  Abdominal:     General: There is distension ( Tympanic to percussion).     Palpations: Abdomen is soft.     Tenderness: There is abdominal tenderness ( Diffuse but worse to the lower quadrants).  Musculoskeletal:        General: No tenderness.     Cervical back: Normal range of motion and neck supple.  Skin:    General: Skin is warm and dry.  Neurological:     Mental Status: She is alert and oriented to person, place, and time.  Psychiatric:        Behavior: Behavior normal.     ED Results / Procedures / Treatments   Labs (all labs ordered are listed, but only abnormal results are displayed) Labs Reviewed  CBC WITH DIFFERENTIAL/PLATELET - Abnormal; Notable for the following components:      Result Value   WBC 17.5 (*)    Neutro Abs 14.6 (*)    Monocytes Absolute 1.2 (*)    Abs Immature Granulocytes 0.17 (*)    All other components within normal limits  COMPREHENSIVE METABOLIC PANEL - Abnormal; Notable for the following components:   Chloride 96 (*)    Glucose, Bld 174 (*)    Creatinine, Ser 0.43 (*)    Albumin 3.0 (*)    AST 57 (*)    ALT 134 (*)    Alkaline Phosphatase 147 (*)    All other components within normal limits  URINALYSIS, ROUTINE W REFLEX MICROSCOPIC - Abnormal; Notable for the following components:   Hgb urine dipstick SMALL (*)    Leukocytes,Ua TRACE (*)  All other components within normal limits  LIPASE, BLOOD  MAGNESIUM    EKG None  Radiology CT ABDOMEN PELVIS W CONTRAST  Result Date: 10/24/2019 CLINICAL DATA:  Abdomen pain with  distension history of lung cancer EXAM: CT ABDOMEN AND PELVIS WITH CONTRAST TECHNIQUE: Multidetector CT imaging of the abdomen and pelvis was performed using the standard protocol following bolus administration of intravenous contrast. CONTRAST:  192m OMNIPAQUE IOHEXOL 300 MG/ML  SOLN COMPARISON:  MRI 10/20/2019, PET-CT 10/01/2019, CT abdomen pelvis 01/21/2008 FINDINGS: Lower chest: Right lower lobe partially cavitary lung mass with postobstructive consolidations. Heart size within normal limits. Trace pericardial effusion. Hepatobiliary: No focal liver abnormality is seen. Status post cholecystectomy. No biliary dilatation. Pancreas: Unremarkable. No pancreatic ductal dilatation or surrounding inflammatory changes. Spleen: Normal in size without focal abnormality. Adrenals/Urinary Tract: Adrenal glands are normal. Slight prominence of left renal collecting system and ureter. Distended urinary bladder. Stomach/Bowel: Stomach is nonenlarged. No dilated small bowel. Moderate to large formed feces at the rectum with moderate feces in the rectosigmoid colon. Liquid stools within the remainder of the colon with slight distension of the cecum. Negative for colon wall thickening. Negative appendix. Vascular/Lymphatic: Extensive aortic atherosclerosis. No aneurysm. No significant adenopathy. Reproductive: Status post hysterectomy. No adnexal masses. Other: No free air or free fluid. Subcutaneous soft tissue nodules are noted. 5 mm subcutaneous nodule to the right of midline slightly inferior to the umbilicus, series 2, image number 58. 7 mm left lateral abdominal wall nodule, series 2, image number 31. 12 mm lobulated nodule overlying the right hip, series 2, image number 88. Musculoskeletal: T8 vertebral metastatic lesion on the left side with some protrusion of mass into the left anterior canal. Known L2 metastatic lesion not well seen on today's CT. Faintly visible left iliac crest metastatic lesion, series 2, image  number 58. other skeletal metastatic foci demonstrated on prior PET CT are not well seen by CT. IMPRESSION: 1. Negative for bowel obstruction or bowel wall thickening. Moderate to large retained feces at the rectum with moderate stool in the rectosigmoid colon. Slight colon distension by liquid stools upstream to the rectosigmoid colon. 2. Incompletely visualized right lower lobe lung mass with postobstructive consolidations. 3. Scattered subcutaneous nodules, possible metastatic foci 4. T8 vertebral metastatic lesion and left iliac crest metastatic osseous lesion. Electronically Signed   By: KDonavan FoilM.D.   On: 10/24/2019 20:52   DG Chest Port 1 View  Result Date: 10/24/2019 CLINICAL DATA:  Hypoxia, history of recent port placement EXAM: PORTABLE CHEST 1 VIEW COMPARISON:  09/29/2019 FINDINGS: Cardiac shadow is mildly enlarged but stable. New right-sided chest wall port is noted with catheter tip at the cavoatrial junction. Right infrahilar density with peripheral consolidation is noted consistent with the known history of lung carcinoma. No bony abnormality is noted. IMPRESSION: No acute abnormality noted. Stable right lower lobe mass lesion with peripheral consolidation. Electronically Signed   By: MInez CatalinaM.D.   On: 10/24/2019 19:11   IR IMAGING GUIDED PORT INSERTION  Result Date: 10/23/2019 INDICATION: History of metastatic lung cancer. In need of durable intravenous access for the initiation of chemotherapy. EXAM: IMPLANTED PORT A CATH PLACEMENT WITH ULTRASOUND AND FLUOROSCOPIC GUIDANCE COMPARISON:  PET-CT-10/01/2019 MEDICATIONS: Ancef 2 gm IV; The antibiotic was administered within an appropriate time interval prior to skin puncture. ANESTHESIA/SEDATION: Moderate (conscious) sedation was employed during this procedure. A total of Versed 2 mg and Fentanyl 100 mcg was administered intravenously. Moderate Sedation Time: 22 minutes. The patient's level of consciousness  and vital signs were  monitored continuously by radiology nursing throughout the procedure under my direct supervision. CONTRAST:  None FLUOROSCOPY TIME:  18 seconds (7 mGy) COMPLICATIONS: None immediate. PROCEDURE: The procedure, risks, benefits, and alternatives were explained to the patient. Questions regarding the procedure were encouraged and answered. The patient understands and consents to the procedure. The right neck and chest were prepped with chlorhexidine in a sterile fashion, and a sterile drape was applied covering the operative field. Maximum barrier sterile technique with sterile gowns and gloves were used for the procedure. A timeout was performed prior to the initiation of the procedure. Local anesthesia was provided with 1% lidocaine with epinephrine. After creating a small venotomy incision, a micropuncture kit was utilized to access the internal jugular vein. Real-time ultrasound guidance was utilized for vascular access including the acquisition of a permanent ultrasound image documenting patency of the accessed vessel. The microwire was utilized to measure appropriate catheter length. A subcutaneous port pocket was then created along the upper chest wall utilizing a combination of sharp and blunt dissection. The pocket was irrigated with sterile saline. A single lumen "ISP" sized power injectable port was chosen for placement. The 8 Fr catheter was tunneled from the port pocket site to the venotomy incision. The port was placed in the pocket. The external catheter was trimmed to appropriate length. At the venotomy, an 8 Fr peel-away sheath was placed over a guidewire under fluoroscopic guidance. The catheter was then placed through the sheath and the sheath was removed. Final catheter positioning was confirmed and documented with a fluoroscopic spot radiograph. The port was accessed with a Huber needle, aspirated and flushed with heparinized saline. The venotomy site was closed with an interrupted 4-0 Vicryl  suture. The port pocket incision was closed with interrupted 2-0 Vicryl suture and the skin was opposed with a running subcuticular 4-0 Vicryl suture. Dermabond and Steri-strips were applied to both incisions. Dressings were placed. The patient tolerated the procedure well without immediate post procedural complication. FINDINGS: After catheter placement, the tip lies within the superior cavoatrial junction. The catheter aspirates and flushes normally and is ready for immediate use. IMPRESSION: Successful placement of a right internal jugular approach power injectable Port-A-Cath. The catheter is ready for immediate use. Electronically Signed   By: Sandi Mariscal M.D.   On: 10/23/2019 10:23    Procedures Fecal disimpaction  Date/Time: 10/24/2019 10:25 PM Performed by: Deno Etienne, DO Authorized by: Deno Etienne, DO  Consent: Verbal consent obtained. Risks and benefits: risks, benefits and alternatives were discussed Consent given by: patient Patient understanding: patient does not state understanding of the procedure being performed Required items: required blood products, implants, devices, and special equipment available Patient identity confirmed: verbally with patient Local anesthesia used: no  Anesthesia: Local anesthesia used: no  Sedation: Patient sedated: no  Patient tolerance: patient tolerated the procedure well with no immediate complications    (including critical care time)  Medications Ordered in ED Medications  sodium chloride 0.9 % bolus 1,000 mL (1,000 mLs Intravenous New Bag/Given 10/24/19 1829)  ketorolac (TORADOL) 30 MG/ML injection 15 mg (15 mg Intravenous Given 10/24/19 1829)  iohexol (OMNIPAQUE) 300 MG/ML solution 100 mL (100 mLs Intravenous Contrast Given 10/24/19 1956)  sodium chloride (PF) 0.9 % injection (  Given by Other 10/24/19 2036)    ED Course  I have reviewed the triage vital signs and the nursing notes.  Pertinent labs & imaging results that were  available during my care of the patient  were reviewed by me and considered in my medical decision making (see chart for details).    MDM Rules/Calculators/A&P                      64 yo F with a chief complaints of abdominal pain.  No bowel movement in about 8 days.  Now not passing urine.  Distended on my exam with diffuse fairly severe abdominal tenderness.  Will obtain a CT scan to evaluate.  CT scan without obstruction.  Patient bladder markedly distended.  Will I&O, attempt disimpaction.   Scattered clay consistency stool in the vault.   Was able to urinate some, but still with 300+ cc post void.  Think this is related to her constipation.  Miralax cleanout. PCP follow up.   10:28 PM:  I have discussed the diagnosis/risks/treatment options with the patient and believe the pt to be eligible for discharge home to follow-up with PCP. We also discussed returning to the ED immediately if new or worsening sx occur. We discussed the sx which are most concerning (e.g., sudden worsening pain, fever, inability to tolerate by mouth) that necessitate immediate return. Medications administered to the patient during their visit and any new prescriptions provided to the patient are listed below.  Medications given during this visit Medications  sodium chloride 0.9 % bolus 1,000 mL (1,000 mLs Intravenous New Bag/Given 10/24/19 1829)  ketorolac (TORADOL) 30 MG/ML injection 15 mg (15 mg Intravenous Given 10/24/19 1829)  iohexol (OMNIPAQUE) 300 MG/ML solution 100 mL (100 mLs Intravenous Contrast Given 10/24/19 1956)  sodium chloride (PF) 0.9 % injection (  Given by Other 10/24/19 2036)     The patient appears reasonably screen and/or stabilized for discharge and I doubt any other medical condition or other [facility] requiring further screening, evaluation, or treatment in the ED at this time prior to discharge.   Final Clinical Impression(s) / ED Diagnoses Final diagnoses:  Lower abdominal pain  Urinary  retention    Rx / DC Orders ED Discharge Orders         Ordered    hydrocortisone (ANUSOL-HC) 2.5 % rectal cream  2 times daily     10/24/19 2228           Deno Etienne, DO 10/24/19 2228

## 2019-10-25 DIAGNOSIS — C3491 Malignant neoplasm of unspecified part of right bronchus or lung: Secondary | ICD-10-CM | POA: Diagnosis not present

## 2019-10-26 ENCOUNTER — Telehealth: Payer: Self-pay | Admitting: Emergency Medicine

## 2019-10-26 ENCOUNTER — Other Ambulatory Visit: Payer: Self-pay

## 2019-10-26 ENCOUNTER — Encounter (HOSPITAL_COMMUNITY): Payer: Self-pay | Admitting: Hematology and Oncology

## 2019-10-26 ENCOUNTER — Telehealth: Payer: Self-pay | Admitting: Pharmacist

## 2019-10-26 ENCOUNTER — Telehealth: Payer: Self-pay | Admitting: *Deleted

## 2019-10-26 ENCOUNTER — Ambulatory Visit
Admission: RE | Admit: 2019-10-26 | Discharge: 2019-10-26 | Disposition: A | Discharge status: Home / Self Care | Payer: BC Managed Care – PPO | Source: Ambulatory Visit | Attending: Radiation Oncology | Admitting: Radiation Oncology

## 2019-10-26 DIAGNOSIS — C7931 Secondary malignant neoplasm of brain: Secondary | ICD-10-CM | POA: Insufficient documentation

## 2019-10-26 DIAGNOSIS — Z510 Encounter for antineoplastic radiation therapy: Secondary | ICD-10-CM | POA: Insufficient documentation

## 2019-10-26 DIAGNOSIS — C7951 Secondary malignant neoplasm of bone: Secondary | ICD-10-CM | POA: Diagnosis not present

## 2019-10-26 DIAGNOSIS — C3490 Malignant neoplasm of unspecified part of unspecified bronchus or lung: Secondary | ICD-10-CM | POA: Insufficient documentation

## 2019-10-26 NOTE — Telephone Encounter (Signed)
CCTG, CE.7 Trial: Dr. Isidore Moos called patient's husband, Virginia [name], with this research nurse on conference call, to address his research related questions. Patient had just returned home from trying to work after her [facility] a Cath placement and husband stated she needed to rest and not able to participate in this call.  Dr. Isidore Moos spent one hour discussing the two types of radiation treatment used in this study.  She reviewed potential side effects and potential risks of each type of treatment with Virginia [name].  This research nurse reviewed the study related activities and timeline required if patient does enroll on this study. Virginia [name] denied any further questions. He requested refill for pain medication for patient and this nurse informed he she will contact Dr. Libby Maw nurse with request.  Plan for patient and husband to think about this study over the weekend and research nurse will call on Monday to follow up if any further questions and/or decision about enrollment.  [name] for his time today.  [name], BSN, RN Clinical Research Nurse 10/23/19 3:00 PM

## 2019-10-26 NOTE — Telephone Encounter (Signed)
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  CMP from 10/24/19 assessed, AST/ALT elevated. No baseline dose adjusted required for hepatic impairment. Monitor hepatic function once capmatinib is started, every 2 weeks for the first 3 months, then month or as clinical indicated. Prescription dose and frequency assessed.   Current medication list in Epic reviewed, one relevant DDIs with capmatinib identified: -Rosuvastatin: capmatinib may increase the concentration of rosuvastatin. No baseline dose adjustment needed. Monitor patient for increased adverse events related to rosuvastatin (ie, muscle pain)  Prescription has been e-scribed to the [facility] for benefits analysis and approval.  Oral Oncology Clinic will continue to follow for insurance authorization, copayment issues, initial counseling and start date.  Darl Pikes, PharmD, BCPS, [facility] Hematology/Oncology Clinical Pharmacist [facility]/AP [facility] [phone]  10/26/2019 4:24 PM

## 2019-10-26 NOTE — Telephone Encounter (Signed)
Left detailed message letting pt know we will check with RB to see if appt needs to be kept at this time.    Dr. Lamonte Sakai please advise if pt still needs 1 month ROV, she is scheduled for 2/18 with RB. Thanks.

## 2019-10-26 NOTE — Telephone Encounter (Signed)
CCTG CE.7 Trial; Called patient to follow up on the study.  Patient's husband, [name], informed this research nurse that they have thought about the study and read all the information. Patient has decided to not enroll on this study because they feel there could be more side effects from the HA-WBRT arm of the study. She would rather have SRS off study.  Thanked patient and her husband for their time and consideration of this study.  Dr. Isidore Moos notified.  [name], BSN, RN Clinical Research Nurse 10/26/2019 4:45 PM

## 2019-10-26 NOTE — Telephone Encounter (Signed)
These creams are about the same. Make sure constipation is controlled. Can be aggravated by pain medication and lack of exercise. Can see if hemorrhoids not improving. Make sure no issues with urinating. Sitz baths in warm soapy water 20 minutes a day in tub helps hemorrhoids. May need Miralax daily for constipation.

## 2019-10-26 NOTE — Telephone Encounter (Signed)
Received fax'd messages from weekend on call service. Apparently patient was having significant problems with constipation and ended up going to the ED. She had a CT scan that revealed significant retained stool in rectum and moderate stool in the rectosigmoid colon and liquid stool beyond that.   TCT husband, Virginia [name].  Spoke with him. He states Virginia [name] came home on 10/25/19. He states the MD in the ED had to manually disimpact her for a large amount of stool. And liquid after wards was evacuated.   She also had an in and out cath for a distended bladder.  Pt is reluctant to take pain meds due to constipation and making her feel sleepy and foggy-headed.  She is still taking the Flexeril and tylenol as well. Virginia [name] is getting her ready to bring her to the [facility] for her [facility].  Darnelle Spangle to have the nurse in [place] or the tech call me after they are done and I can talk to them both about bowel regime and pain meds.  Virginia [name] voiced understanding. Dr. Lorenso Courier aware of the above.

## 2019-10-26 NOTE — Telephone Encounter (Signed)
Virginia [name] was notified, she said they have a nurse that is keeping up with her constipation.

## 2019-10-26 NOTE — Telephone Encounter (Signed)
Patient called back she is resting at home from the ED. She is doing ok. She is requesting a prescription for Hydrocortisone Pramoxine HCL Cream she was given hydrocortisone (ANUSOL-HC) 2.5 % rectal cream at the ED but she wants to know which one would you recommend?

## 2019-10-27 ENCOUNTER — Telehealth: Payer: Self-pay | Admitting: Hematology and Oncology

## 2019-10-27 ENCOUNTER — Telehealth: Payer: Self-pay

## 2019-10-27 DIAGNOSIS — C3490 Malignant neoplasm of unspecified part of unspecified bronchus or lung: Secondary | ICD-10-CM | POA: Diagnosis not present

## 2019-10-27 DIAGNOSIS — Z510 Encounter for antineoplastic radiation therapy: Secondary | ICD-10-CM | POA: Diagnosis not present

## 2019-10-27 DIAGNOSIS — C7951 Secondary malignant neoplasm of bone: Secondary | ICD-10-CM | POA: Diagnosis not present

## 2019-10-27 DIAGNOSIS — C7931 Secondary malignant neoplasm of brain: Secondary | ICD-10-CM | POA: Diagnosis not present

## 2019-10-27 NOTE — Telephone Encounter (Signed)
Oral Oncology Patient Advocate Encounter  Prior Authorization for Suzette Battiest has been approved.    PA# [id number] Effective dates: 10/27/19 through 10/25/20  Patients co-pay is $100  Oral Oncology Clinic will continue to follow.   [place] Patient [place] Phone [phone] Fax [phone] 10/27/2019 1:27 PM

## 2019-10-27 NOTE — Telephone Encounter (Signed)
Called pt to confirm 2/17 appts - unable to reach ,.left message with appts.

## 2019-10-27 NOTE — Telephone Encounter (Signed)
Oral Oncology Patient Advocate Encounter  Received notification from [place] that prior authorization for Tabrecta is required.  PA submitted on CoverMyMeds Key [id number] Status is pending  Oral Oncology Clinic will continue to follow.  [facility] Patient [place] Phone [phone] Fax [phone] 10/27/2019 9:02 AM

## 2019-10-28 ENCOUNTER — Ambulatory Visit: Payer: BC Managed Care – PPO | Admitting: Radiation Oncology

## 2019-10-28 ENCOUNTER — Ambulatory Visit (HOSPITAL_COMMUNITY)
Admission: RE | Admit: 2019-10-28 | Discharge: 2019-10-28 | Disposition: A | Discharge status: Home / Self Care | Payer: BC Managed Care – PPO | Source: Ambulatory Visit | Attending: Radiation Oncology | Admitting: Radiation Oncology

## 2019-10-28 ENCOUNTER — Other Ambulatory Visit: Payer: Self-pay

## 2019-10-28 ENCOUNTER — Inpatient Hospital Stay: Payer: BC Managed Care – PPO

## 2019-10-28 ENCOUNTER — Inpatient Hospital Stay (HOSPITAL_BASED_OUTPATIENT_CLINIC_OR_DEPARTMENT_OTHER): Payer: BC Managed Care – PPO | Admitting: Hematology and Oncology

## 2019-10-28 ENCOUNTER — Other Ambulatory Visit: Payer: Self-pay | Admitting: *Deleted

## 2019-10-28 ENCOUNTER — Encounter: Payer: Self-pay | Admitting: Radiation Oncology

## 2019-10-28 ENCOUNTER — Ambulatory Visit
Admission: RE | Admit: 2019-10-28 | Discharge: 2019-10-28 | Disposition: A | Discharge status: Home / Self Care | Payer: BC Managed Care – PPO | Source: Ambulatory Visit | Attending: Radiation Oncology | Admitting: Radiation Oncology

## 2019-10-28 VITALS — BP 133/83 | HR 79 | Temp 97.1°F | Resp 18 | Ht 62.0 in | Wt 147.9 lb

## 2019-10-28 DIAGNOSIS — I119 Hypertensive heart disease without heart failure: Secondary | ICD-10-CM | POA: Diagnosis not present

## 2019-10-28 DIAGNOSIS — C3491 Malignant neoplasm of unspecified part of right bronchus or lung: Secondary | ICD-10-CM

## 2019-10-28 DIAGNOSIS — R2689 Other abnormalities of gait and mobility: Secondary | ICD-10-CM | POA: Diagnosis not present

## 2019-10-28 DIAGNOSIS — C7931 Secondary malignant neoplasm of brain: Secondary | ICD-10-CM | POA: Insufficient documentation

## 2019-10-28 DIAGNOSIS — K219 Gastro-esophageal reflux disease without esophagitis: Secondary | ICD-10-CM | POA: Diagnosis not present

## 2019-10-28 DIAGNOSIS — R29818 Other symptoms and signs involving the nervous system: Secondary | ICD-10-CM | POA: Diagnosis not present

## 2019-10-28 DIAGNOSIS — J90 Pleural effusion, not elsewhere classified: Secondary | ICD-10-CM | POA: Diagnosis not present

## 2019-10-28 DIAGNOSIS — C7951 Secondary malignant neoplasm of bone: Secondary | ICD-10-CM | POA: Diagnosis not present

## 2019-10-28 DIAGNOSIS — R609 Edema, unspecified: Secondary | ICD-10-CM | POA: Diagnosis not present

## 2019-10-28 DIAGNOSIS — Z23 Encounter for immunization: Secondary | ICD-10-CM | POA: Diagnosis not present

## 2019-10-28 DIAGNOSIS — C3431 Malignant neoplasm of lower lobe, right bronchus or lung: Secondary | ICD-10-CM | POA: Diagnosis not present

## 2019-10-28 DIAGNOSIS — I700 Atherosclerosis of aorta: Secondary | ICD-10-CM | POA: Diagnosis not present

## 2019-10-28 DIAGNOSIS — C3490 Malignant neoplasm of unspecified part of unspecified bronchus or lung: Secondary | ICD-10-CM | POA: Diagnosis not present

## 2019-10-28 DIAGNOSIS — M549 Dorsalgia, unspecified: Secondary | ICD-10-CM | POA: Diagnosis not present

## 2019-10-28 DIAGNOSIS — Z510 Encounter for antineoplastic radiation therapy: Secondary | ICD-10-CM | POA: Diagnosis not present

## 2019-10-28 DIAGNOSIS — C7949 Secondary malignant neoplasm of other parts of nervous system: Secondary | ICD-10-CM | POA: Insufficient documentation

## 2019-10-28 DIAGNOSIS — C781 Secondary malignant neoplasm of mediastinum: Secondary | ICD-10-CM | POA: Diagnosis not present

## 2019-10-28 DIAGNOSIS — R042 Hemoptysis: Secondary | ICD-10-CM | POA: Diagnosis not present

## 2019-10-28 DIAGNOSIS — C778 Secondary and unspecified malignant neoplasm of lymph nodes of multiple regions: Secondary | ICD-10-CM | POA: Diagnosis not present

## 2019-10-28 DIAGNOSIS — R42 Dizziness and giddiness: Secondary | ICD-10-CM | POA: Diagnosis not present

## 2019-10-28 LAB — CBC WITH DIFFERENTIAL (CANCER CENTER ONLY)
Abs Immature Granulocytes: 0.43 10*3/uL — ABNORMAL HIGH (ref 0.00–0.07)
Basophils Absolute: 0.1 10*3/uL (ref 0.0–0.1)
Basophils Relative: 0 %
Eosinophils Absolute: 0.6 10*3/uL — ABNORMAL HIGH (ref 0.0–0.5)
Eosinophils Relative: 4 %
HCT: 36.7 % (ref 36.0–46.0)
Hemoglobin: 12.3 g/dL (ref 12.0–15.0)
Immature Granulocytes: 3 %
Lymphocytes Relative: 10 %
Lymphs Abs: 1.7 10*3/uL (ref 0.7–4.0)
MCH: 30.8 pg (ref 26.0–34.0)
MCHC: 33.5 g/dL (ref 30.0–36.0)
MCV: 92 fL (ref 80.0–100.0)
Monocytes Absolute: 1.4 10*3/uL — ABNORMAL HIGH (ref 0.1–1.0)
Monocytes Relative: 8 %
Neutro Abs: 13.3 10*3/uL — ABNORMAL HIGH (ref 1.7–7.7)
Neutrophils Relative %: 75 %
Platelet Count: 359 10*3/uL (ref 150–400)
RBC: 3.99 MIL/uL (ref 3.87–5.11)
RDW: 12 % (ref 11.5–15.5)
WBC Count: 17.5 10*3/uL — ABNORMAL HIGH (ref 4.0–10.5)
nRBC: 0 % (ref 0.0–0.2)

## 2019-10-28 LAB — CMP (CANCER CENTER ONLY)
ALT: 53 U/L — ABNORMAL HIGH (ref 0–44)
AST: 18 U/L (ref 15–41)
Albumin: 2.7 g/dL — ABNORMAL LOW (ref 3.5–5.0)
Alkaline Phosphatase: 127 U/L — ABNORMAL HIGH (ref 38–126)
Anion gap: 10 (ref 5–15)
BUN: 12 mg/dL (ref 8–23)
CO2: 27 mmol/L (ref 22–32)
Calcium: 9.4 mg/dL (ref 8.9–10.3)
Chloride: 102 mmol/L (ref 98–111)
Creatinine: 0.63 mg/dL (ref 0.44–1.00)
GFR, Est AFR Am: >60 mL/min (ref >60)
GFR, Estimated: >60 mL/min (ref >60)
Glucose, Bld: 129 mg/dL — ABNORMAL HIGH (ref 70–99)
Potassium: 3.8 mmol/L (ref 3.5–5.1)
Sodium: 139 mmol/L (ref 135–145)
Total Bilirubin: <0.2 mg/dL — ABNORMAL LOW (ref 0.3–1.2)
Total Protein: 7 g/dL (ref 6.5–8.1)

## 2019-10-28 MED ORDER — GADOBUTROL 1 MMOL/ML IV SOLN
7.0000 mL | Freq: Once | INTRAVENOUS | Status: AC | PRN
  Administered 2019-10-28: 7 mL via INTRAVENOUS

## 2019-10-28 MED ORDER — CAPMATINIB HCL 200 MG PO TABS: 400.0000 mg | ORAL_TABLET | Freq: Two times a day (BID) | ORAL | 1 refills | Status: DC

## 2019-10-28 NOTE — Telephone Encounter (Signed)
It would probably be reasonable for her to follow up with me at some point for her COPD. I'll leave that up to her. If she would like to reschedule (given the upcoming bad weather) that would be fine.

## 2019-10-28 NOTE — Progress Notes (Signed)
  Radiation Oncology         [phone]) [phone] ________________________________  Name: Virginia [name] MRN: [id number]  Date: 10/26/2019  DOB: 1956-07-11  SIMULATION AND TREATMENT PLANNING NOTE Outpatient  DIAGNOSIS:     ICD-10-CM   1. Bone metastases ([place])  C79.51     NARRATIVE:  The patient was brought to the [facility].  Identity was confirmed.  All relevant records and images related to the planned course of therapy were reviewed.  The patient freely provided informed written consent to proceed with treatment after reviewing the details related to the planned course of therapy. The consent form was witnessed and verified by the simulation staff.  Adhesive wiring was placed on her back to localize her pain.  Exam is notable for a paraspinal soft tissue mass in the left mid back consistent with lipoma. (Lipoma was identified on her MRI at tumor board this morning)  Then, the patient was set-up in a stable reproducible  supine position for radiation therapy.  CT images were obtained.  Surface markings were placed.  The CT images were loaded into the planning software.    TREATMENT PLANNING NOTE: Treatment planning then occurred.  The radiation prescription was entered and confirmed.    A total of 3 medically necessary complex treatment devices were fabricated and supervised by me, in the form of 2 fields with MLC's to block heart and lung, as well as 1 Vac-Loc. MORE FIELDS WITH MLCs MAY BE ADDED IN DOSIMETRY for dose homogeneity.  I have requested : 3D Simulation  I have requested a DVH of the following structures: Heart, lungs, kidneys, target.     The patient will receive 30 Gy in 10 fractions from the top of T6 to the bottom of T9 of the thoracic spine.   -----------------------------------  [name], MD

## 2019-10-28 NOTE — Telephone Encounter (Signed)
Pt does have a mychart visit scheduled tomorrow 2/18 with RB. Looks like message was left for pt today 2/17 about that appt.

## 2019-10-28 NOTE — Patient Instructions (Signed)
Capmatinib Tablets What is this medicine? CAPMATINIB (kap MA ti nib) is a medicine that targets proteins in cancer cells and stops the cancer cells from growing. It is used to treat certain types of lung cancer. This medicine may be used for other purposes; ask your health care provider or pharmacist if you have questions. COMMON BRAND NAME(S): TABRECTA What should I tell my health care provider before I take this medicine? They need to know if you have any of these conditions:  liver disease  lung or breathing disease, like asthma  an unusual or allergic reaction to capmatinib, other medicines, foods, dyes or preservatives  pregnant or trying to get pregnant  breast-feeding How should I use this medicine? Take this medicine by mouth with a glass of water. Follow the directions on the prescription label. Do not cut, crush or chew this medicine. Swallow the tablets whole. You can take it with or without food. If it upsets your stomach, take it with food. Do not take with grapefruit juice. Take your medicine at regular intervals. Do not take it more often than directed. Do not stop taking except on your doctor's advice. Talk to your pediatrician about the use of this medicine in children. Special care may be needed. Overdosage: If you think you have taken too much of this medicine contact a poison control center or emergency room at once. NOTE: This medicine is only for you. Do not share this medicine with others. What if I miss a dose? If you miss a dose, skip it. Take your next dose at the normal time. Do not take extra or 2 doses at the same time to make up for the missed dose. What may interact with this medicine?  caffeine  certain antibiotics like clarithromycin  certain antivirals for HIV or hepatitis  certain medicines for fungal infections like ketoconazole, itraconazole, or posaconazole  certain medicines for seizures like carbamazepine, phenobarbital,  phenytoin  digoxin  grapefruit juice  rosuvastatin  [place] Wort This list may not describe all possible interactions. Give your health care provider a list of all the medicines, herbs, non-prescription drugs, or dietary supplements you use. Also tell them if you smoke, drink alcohol, or use illegal drugs. Some items may interact with your medicine. What should I watch for while using this medicine? Visit your health care professional for regular checks on your progress. Tell your health care professional if your symptoms do not start to get better or if they get worse. You may need blood work done while you are taking this medicine. Do not become pregnant while taking this medicine or for 1 week after stopping it. Women should inform their health care professional if they wish to become pregnant or think they might be pregnant. Men should not father a child while taking this medicine and for 1 week after stopping it. There is potential for serious side effects to an unborn child. Talk to your health care professional for more information. Do not breast-feed a child while taking this medicine or for 1 week after stopping it. Call your health care professional for advice if you get a fever, chills, or sore throat, or other symptoms of a cold or flu. Do not treat yourself. This medicine decreases your body's ability to fight infections. Try to avoid being around people who are sick. Avoid taking medicines that contain aspirin, acetaminophen, ibuprofen, naproxen, or ketoprofen unless instructed by your health care professional. These medicines may hide a fever. This medicine can make  you more sensitive to the sun. Keep out of the sun, If you cannot avoid being in the sun, wear protective clothing and sunscreen. Do not use sun lamps or tanning beds/booths. What side effects may I notice from receiving this medicine? Side effects that you should report to your doctor or health care professional as  soon as possible:  allergic reactions like skin rash, itching or hives; swelling of the face, lips, or tongue  breathing problems  cough  low blood counts - this medicine may decrease the number of white blood cells, red blood cells, and platelets. You may be at increased risk for infections and bleeding  nausea, vomiting  signs and symptoms of infection like fever; chills; cough; sore throat; pain or trouble passing urine  signs and symptoms of liver injury like dark yellow or brown urine; general ill feeling or flu-like symptoms; light-colored stools; loss of appetite; nausea; right upper belly pain; unusually weak or tired; yellowing of the eyes or skin Side effects that usually do not require medical attention (report these to your doctor or health care professional if they continue or are bothersome):  constipation  diarrhea  loss of appetite  rash on cheeks or arms that gets worse in the sun  signs and symptoms of low red blood cells or anemia such as unusually weak or tired; feeling faint or lightheaded; falls; breathing problems  swelling of the ankles, feet, hands This list may not describe all possible side effects. Call your doctor for medical advice about side effects. You may report side effects to FDA at 1-800-FDA-1088. Where should I keep my medicine? Keep out of the reach of children. Store between 15 and 30 degrees C (59 and 86 degrees F). Keep this medicine in the original container. Do not throw out the packet in the container. It keeps the medicine dry. Throw away any unused medicine after 6 weeks. NOTE: This sheet is a summary. It may not cover all possible information. If you have questions about this medicine, talk to your doctor, pharmacist, or health care provider.  2020 Elsevier/Gold Standard (2019-01-23 11:23:45)

## 2019-10-28 NOTE — Telephone Encounter (Signed)
Oral Chemotherapy Pharmacist Encounter  Patient plans on picking up Trabrecta from [facility] Monday afternoon 11/02/19. She know to get started after picking up the medication.  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   Counseled patient on administration, dosing, side effects, monitoring, drug-food interactions, safe handling, storage, and disposal. Patient will take 2 tablets (400 mg total) by mouth 2 (two) times daily.  Side effects include but not limited to: edema, N/V, hepatic impairment, fatigue.   Reviewed with patient importance of keeping a medication schedule and plan for any missed doses.  They voiced understanding and appreciation. All questions answered. Medication handout placed in the mail.  Provided patient with [facility] phone number. Patient knows to call the office with questions or concerns. Oral Chemotherapy Navigation Clinic will continue to follow.  Darl Pikes, PharmD, BCPS, [facility] Hematology/Oncology Clinical Pharmacist [facility] [phone]  10/28/2019 2:50 PM

## 2019-10-28 NOTE — Telephone Encounter (Signed)
Called spoke with patient's spouse [name] (dpr on file) and discussed Dr Agustina Caroli recommendations below.  Per [name], they met with Dr Narda Rutherford this morning, pt to start chemo today.  She is currently having an MRI, will see radiation oncology afterward.  Dr Lorenso Courier is hoping that the shrinking of the mass will help with her [facility] but [name] is asking if there is anything RB can do to help this now.  Advised this was not specifically asked of Dr Lamonte Sakai so we have no recommendations regarding it but it can be discussed during the video visit tomorrow.  [name] would like to speak with patient about keeping/cancelling appt.  Darnelle Spangle to call the office by 5pm if he can (he does not know when patient will be done with her MRI) to let us know if we need to cancel it.  Will also place note in appt info so that if pt does not log on, Dr Lamonte Sakai will not think patient noshowed.  Will still hold message open to [facility] appt if patient decides not to keep the 2/18 video visit with RB.

## 2019-10-28 NOTE — Progress Notes (Signed)
Has armband been applied?  Yes  Does patient have an allergy to IV contrast dye?: No   Has patient ever received premedication for IV contrast dye?: N/A  Does patient take metformin?: No  If patient does take metformin when was the last dose: N/A  Date of lab work: 09/29/19 BUN: 11 CR: 0.64 EGFR: > 60  IV site: right AC  Has IV site been added to flowsheet?  Yes  Temp 97.8, BP 105/75, pulse 86, resp 20, O2 sat 98

## 2019-10-29 ENCOUNTER — Ambulatory Visit: Payer: BC Managed Care – PPO

## 2019-10-29 ENCOUNTER — Ambulatory Visit: Payer: BC Managed Care – PPO | Admitting: Radiation Oncology

## 2019-10-29 ENCOUNTER — Telehealth (INDEPENDENT_AMBULATORY_CARE_PROVIDER_SITE_OTHER): Payer: BC Managed Care – PPO | Admitting: Emergency Medicine

## 2019-10-29 ENCOUNTER — Encounter: Payer: Self-pay | Admitting: Hematology and Oncology

## 2019-10-29 ENCOUNTER — Other Ambulatory Visit: Payer: Self-pay | Admitting: Radiation Therapy

## 2019-10-29 ENCOUNTER — Ambulatory Visit
Admission: RE | Admit: 2019-10-29 | Discharge: 2019-10-29 | Disposition: A | Discharge status: Home / Self Care | Payer: BC Managed Care – PPO | Source: Ambulatory Visit | Attending: Radiation Oncology | Admitting: Radiation Oncology

## 2019-10-29 ENCOUNTER — Inpatient Hospital Stay: Payer: BC Managed Care – PPO

## 2019-10-29 ENCOUNTER — Ambulatory Visit: Admission: RE | Admit: 2019-10-29 | Payer: BC Managed Care – PPO | Source: Ambulatory Visit

## 2019-10-29 DIAGNOSIS — Z510 Encounter for antineoplastic radiation therapy: Secondary | ICD-10-CM | POA: Diagnosis not present

## 2019-10-29 DIAGNOSIS — C7931 Secondary malignant neoplasm of brain: Secondary | ICD-10-CM | POA: Diagnosis not present

## 2019-10-29 DIAGNOSIS — C7951 Secondary malignant neoplasm of bone: Secondary | ICD-10-CM | POA: Diagnosis not present

## 2019-10-29 DIAGNOSIS — J449 Chronic obstructive pulmonary disease, unspecified: Secondary | ICD-10-CM

## 2019-10-29 DIAGNOSIS — C3490 Malignant neoplasm of unspecified part of unspecified bronchus or lung: Secondary | ICD-10-CM | POA: Diagnosis not present

## 2019-10-29 NOTE — Progress Notes (Signed)
Subjective:    Patient ID: Virginia [name], female    DOB: Jan 18, 1956, 64 y.o.   MRN: [id number]  HPI 64 year old former smoker (20-30 pack years) with a history of hypertension, GERD, anxiety.  She is followed by Dr. Renold Genta.  She is referred today for abnormal chest imaging and a right lower lobe mass. Patient reports that she began to have weakness, wheeze and non-productive cough in December. She also rarely saw some blood-tinged sputum, treated w abx and prednisone. May have improved some but still SOB, still wheeze, pink sputum. Prompted CXR 12/29 that showed RLL volume loss, possible hilar mass. A Ct chest 1/15 reviewed by me, shows a proximal right lower lobe mass, 5.7 cm with some central clearing.  Patchy postobstructive infiltrate consistent with possible postobstructive pneumonia/pneumonitis.  There is right paratracheal, prevascular and right hilar lymphadenopathy.  Also a 1.5 cm left paratracheal node.  Several subcentimeter solid pulmonary nodules identified bilaterally of unclear significance.  She works, has good exercise tolerance. She was just started on breztri, albuterol, has benefited some.   ROV 10/29/2019 Video Visit --patient underwent nodal biopsy 1/27, cytology consistent with non-small cell lung cancer.   Review of Systems  Constitutional: Negative for fever and unexpected weight change.  HENT: Positive for nosebleeds. Negative for congestion, dental problem, ear pain, postnasal drip, rhinorrhea, sinus pressure, sneezing, sore throat and trouble swallowing.   Eyes: Negative for redness and itching.  Respiratory: Positive for cough, shortness of breath and wheezing. Negative for chest tightness.   Cardiovascular: Negative for palpitations and leg swelling.  Gastrointestinal: Negative for nausea and vomiting.  Genitourinary: Negative for dysuria.  Musculoskeletal: Negative for joint swelling.  Skin: Negative for rash.  Allergic/Immunologic: Negative.  Negative for  environmental allergies, food allergies and immunocompromised state.  Neurological: Positive for headaches.  Hematological: Does not bruise/bleed easily.  Psychiatric/Behavioral: Negative for dysphoric mood. The patient is not nervous/anxious.     Past Medical History:  Diagnosis Date  . Anxiety   . Cancer ([place])   . Dyspnea   . GERD (gastroesophageal reflux disease)   . Hypercholesterolemia    per pt, she does not have elevated lipids  . Hypertension   . Tobacco abuse      Family History  Problem Relation Age of Onset  . Heart disease Father   . Drug abuse Daughter   . Drug abuse Son   . Cancer Sister   . Heart disease Brother   . Heart attack Brother      Social History   Socioeconomic History  . Marital status: Married    Spouse name: Not on file  . Number of children: 3  . Years of education: Not on file  . Highest education level: Not on file  Occupational History  . Occupation: owns Medical illustrator: [employer]  Tobacco Use  . Smoking status: Former Smoker    Packs/day: 0.25    Quit date: 10/31/2012    Years since quitting: 6.9  . Smokeless tobacco: Never Used  Substance and Sexual Activity  . Alcohol use: Yes    Alcohol/week: 0.0 standard drinks    Comment: rare  . Drug use: No  . Sexual activity: Yes  Other Topics Concern  . Not on file  Social History Narrative  . Not on file   Social Determinants of Health   Financial Resource Strain:   . Difficulty of Paying Living Expenses: Not on file  Food Insecurity:   . Worried About  Running Out of Food in the Last Year: Not on file  . Ran Out of Food in the Last Year: Not on file  Transportation Needs:   . Lack of Transportation (Medical): Not on file  . Lack of Transportation (Non-Medical): Not on file  Physical Activity:   . Days of Exercise per Week: Not on file  . Minutes of Exercise per Session: Not on file  Stress:   . Feeling of Stress : Not on file  Social Connections:   .  Frequency of Communication with Friends and Family: Not on file  . Frequency of Social Gatherings with Friends and Family: Not on file  . Attends Religious Services: Not on file  . Active Member of Clubs or Organizations: Not on file  . Attends Archivist Meetings: Not on file  . Marital Status: Not on file  Intimate Partner Violence:   . Fear of Current or Ex-Partner: Not on file  . Emotionally Abused: Not on file  . Physically Abused: Not on file  . Sexually Abused: Not on file     No Known Allergies   Outpatient Medications Prior to Visit  Medication Sig Dispense Refill  . acetaminophen (TYLENOL) 500 MG tablet Take 500-1,000 mg by mouth every 6 (six) hours as needed (for pain.).    Marland Kitchen albuterol (VENTOLIN HFA) 108 (90 Base) MCG/ACT inhaler Inhale 2 puffs into the lungs every 6 (six) hours as needed for wheezing or shortness of breath. 8 g 0  . ALPRAZolam (XANAX) 0.25 MG tablet TAKE 1 TABLET BY MOUTH TWICE DAILY AS NEEDED FOR ANXIETY 60 tablet 5  . APPLE CIDER VINEGAR PO Take 450 mg by mouth daily.    Marland Kitchen aspirin 325 MG EC tablet Take 325 mg by mouth every 8 (eight) hours as needed for pain.    . Budeson-Glycopyrrol-Formoterol (BREZTRI AEROSPHERE) 160-9-4.8 MCG/ACT AERO Inhale 2 puffs into the lungs 2 (two) times daily.    . capmatinib (TABRECTA) 200 MG tablet Take 2 tablets (400 mg total) by mouth 2 (two) times daily. 56 tablet 1  . Carboxymethylcellul-Glycerin (CLEAR EYES FOR DRY EYES) 1-0.25 % SOLN Place 1 drop into both eyes 3 (three) times daily as needed.    . cyclobenzaprine (FLEXERIL) 10 MG tablet Take 1 tablet (10 mg total) by mouth 3 (three) times daily as needed for muscle spasms. (Patient not taking: Reported on 10/28/2019) 60 tablet 0  . dexamethasone (DECADRON) 2 MG tablet Take 1 tablet (2 mg total) by mouth 2 (two) times daily. (Patient taking differently: Take 2 mg by mouth daily. ) 60 tablet 2  . ELDERBERRY PO Take 1,250 mg by mouth daily.    . hydrochlorothiazide  (HYDRODIURIL) 25 MG tablet TAKE 1 TABLET(25 MG) BY MOUTH DAILY (Patient taking differently: Take 25 mg by mouth daily. ) 90 tablet 0  . hydrocortisone (ANUSOL-HC) 2.5 % rectal cream Place 1 application rectally 2 (two) times daily. 30 g 0  . L-Lysine 500 MG TABS Take 500 mg by mouth 2 (two) times daily as needed (cold sores/fever blisters.).    Marland Kitchen losartan (COZAAR) 100 MG tablet TAKE 1 TABLET(100 MG) BY MOUTH DAILY (Patient taking differently: Take 100 mg by mouth daily. TAKE 1 TABLET(100 MG) BY MOUTH DAILY) 90 tablet 0  . ondansetron (ZOFRAN) 4 MG tablet Take 1 tablet (4 mg total) by mouth every 8 (eight) hours as needed for nausea or vomiting. (Patient not taking: Reported on 10/21/2019) 20 tablet 0  . OVER THE COUNTER MEDICATION Apply  1 application topically daily as needed (shoulder pain.). [place] Gel OTC    . oxyCODONE (OXY IR/ROXICODONE) 5 MG immediate release tablet Take 1-2 tablets (5-10 mg total) by mouth every 6 (six) hours as needed for severe pain or breakthrough pain. 60 tablet 0  . pantoprazole (PROTONIX) 40 MG tablet TAKE 1 TABLET(40 MG) BY MOUTH DAILY 90 tablet 3  . polyethylene glycol (MIRALAX / GLYCOLAX) 17 g packet Take 17 g by mouth daily.    . Probiotic Product (EQL PROBIOTIC COLON SUPPORT) CAPS Take 1 capsule by mouth daily.    . rosuvastatin (CRESTOR) 5 MG tablet TAKE 1 TABLET BY MOUTH THREE TIMES WEEKLY AS DIRECTED (Patient taking differently: Take 5 mg by mouth every Monday, Wednesday, and Friday. TAKE 1 TABLET BY MOUTH THREE TIMES WEEKLY AS DIRECTED) 36 tablet 1  . sennosides-docusate sodium (SENOKOT-S) 8.6-50 MG tablet Take 1 tablet by mouth daily. 2 in the morning and 2 at night. Adjust as needed    . Vitamin D3 (VITAMIN D) 25 MCG tablet Take 2,000 Units by mouth daily.     No facility-administered medications prior to visit.        Objective:   Physical Exam  There were no vitals filed for this visit. Gen: Pleasant, well-nourished, in no distress, a bit  tearful  ENT: No lesions,  mouth clear,  oropharynx clear, no postnasal drip  Neck: No JVD, no stridor  Lungs: No use of accessory muscles, inspiratory squeaks and expiratory rhonchi on the right, clear on the left  Cardiovascular: RRR, heart sounds normal, no murmur or gallops, no peripheral edema  Musculoskeletal: No deformities, no cyanosis or clubbing  Neuro: alert, awake, non focal  Skin: Warm, no lesions or rash     Assessment & Plan:  No problem-specific Assessment & Plan notes found for this encounter.   Baltazar Apo, MD, PhD 10/29/2019, 2:05 PM [facility] [phone] or if no answer [phone]

## 2019-10-29 NOTE — Progress Notes (Signed)
[place] [phone]   [phone]  PROGRESS NOTE  Patient Care Team: Elby Showers, MD as PCP - General (Internal Medicine) Valrie Hart, RN as Oncology Nurse Navigator  Hematological/Oncological History Hematological/Oncological History # Metastatic Adenocarcinoma of the Lung 1) 09/29/2019: patient presented to the ED after fall down stairs. CT of the head showed showed concerning for metastatic disease involving the left cerebellum and right occipital lobe.  2) 09/30/2019: MRI brain confirms mass within the left cerebellum measures 1.6 x 1.3 x 1.5 cm as well as at least 8 metastatic lesions within the supratentorial and infratentorial brain as outlined 3) 10/01/2019: PET CT scan revealed hypermetabolic infrahilar right lower lobe mass with hypermetabolic nodal metastases in the right hilum, mediastinum, right supraclavicular region, left axilla and lower left neck. 4) 10/08/2019: US guided biopsy of the lymph nodes confirms poorly differentiated non-small cell carcinoma. Immunohistochemistry confirms adenocarcinoma of lung primary. PD-L1 and NGS testing pending.  5) 10/12/2019: Establish care with Dr. Lorenso Courier   Interval History:  Virginia [name] Height 64 y.o. female with medical history significant for metastatic adenocarcinoma of the lung presents for a follow up visit. The patient's last visit was on 10/12/2019 at which time she established car with our clinic. 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   On exam today Mrs. Ghan notes that her pain is under considerably better control.  She also notes that the dizziness that she was having has subsequently improved with discontinuation of the Flexeril medication.  She continues to have some shortness of breath, but has good appetite and has not been having any issues with fevers, chills, nausea, or  diarrhea.  On the contrary she has been having some issues with constipation due to opioid regimen and therefore has started taking MiraLAX in addition to stool softeners.  She reports that she is taking 4 oxycodone 5 mg/day every 6 hours and alternating this with 1000 mg of Tylenol.  She is currently scheduled for radiation of the brain to start Thursday 10/29/2019.  Overall she has improved significantly in terms of symptom management since her last visit.  Today we discussed the results of her foundation 1 testing in the news that she would be eligible for met exon 14 targeted therapy such as capmatinib.  Additionally her PD-L1 TPS score is excellent at 90%.  After discussion with the patient and her husband the details of which are listed below the patient was agreeable to starting capmatinib therapy.  MEDICAL HISTORY:  Past Medical History:  Diagnosis Date   Anxiety    Cancer ([place])    Dyspnea    GERD (gastroesophageal reflux disease)    Hypercholesterolemia    per pt, she does not have elevated lipids   Hypertension    Tobacco abuse     SURGICAL HISTORY: Past Surgical History:  Procedure Laterality Date   ABDOMINAL HYSTERECTOMY     partial/ left ovaries   CHOLECYSTECTOMY     DILATION AND CURETTAGE OF UTERUS     IR IMAGING GUIDED PORT INSERTION  10/23/2019   LIPOMA EXCISION  2018   removed under left breast and right thigh.   TUBAL LIGATION      SOCIAL HISTORY: Social History   Socioeconomic History   Marital status: Married    Spouse name: Not on file   Number of children: 3   Years  of education: Not on file   Highest education level: Not on file  Occupational History   Occupation: owns printing co    Employer: [employer]  Tobacco Use   Smoking status: Former Smoker    Packs/day: 0.25    Quit date: 10/31/2012    Years since quitting: 6.9   Smokeless tobacco: Never Used  Substance and Sexual Activity   Alcohol use: Yes    Alcohol/week: 0.0  standard drinks    Comment: rare   Drug use: No   Sexual activity: Yes  Other Topics Concern   Not on file  Social History Narrative   Not on file   Social Determinants of Health   Financial Resource Strain:    Difficulty of Paying Living Expenses: Not on file  Food Insecurity:    Worried About [place] in the Last Year: Not on file   Ran Out of Food in the Last Year: Not on file  Transportation Needs:    Lack of Transportation (Medical): Not on file   Lack of Transportation (Non-Medical): Not on file  Physical Activity:    Days of Exercise per Week: Not on file   Minutes of Exercise per Session: Not on file  Stress:    Feeling of Stress : Not on file  Social Connections:    Frequency of Communication with Friends and Family: Not on file   Frequency of Social Gatherings with Friends and Family: Not on file   Attends Religious Services: Not on file   Active Member of Clubs or Organizations: Not on file   Attends Archivist Meetings: Not on file   Marital Status: Not on file  Intimate Partner Violence:    Fear of Current or Ex-Partner: Not on file   Emotionally Abused: Not on file   Physically Abused: Not on file   Sexually Abused: Not on file    FAMILY HISTORY: Family History  Problem Relation Age of Onset   Heart disease Father    Drug abuse Daughter    Drug abuse Son    Cancer Sister    Heart disease Brother    Heart attack Brother     ALLERGIES:  has No Known Allergies.  MEDICATIONS:  Current Outpatient Medications  Medication Sig Dispense Refill   sennosides-docusate sodium (SENOKOT-S) 8.6-50 MG tablet Take 1 tablet by mouth daily. 2 in the morning and 2 at night. Adjust as needed     acetaminophen (TYLENOL) 500 MG tablet Take 500-1,000 mg by mouth every 6 (six) hours as needed (for pain.).     albuterol (VENTOLIN HFA) 108 (90 Base) MCG/ACT inhaler Inhale 2 puffs into the lungs every 6 (six) hours as needed  for wheezing or shortness of breath. 8 g 0   ALPRAZolam (XANAX) 0.25 MG tablet TAKE 1 TABLET BY MOUTH TWICE DAILY AS NEEDED FOR ANXIETY 60 tablet 5   APPLE CIDER VINEGAR PO Take 450 mg by mouth daily.     aspirin 325 MG EC tablet Take 325 mg by mouth every 8 (eight) hours as needed for pain.     Budeson-Glycopyrrol-Formoterol (BREZTRI AEROSPHERE) 160-9-4.8 MCG/ACT AERO Inhale 2 puffs into the lungs 2 (two) times daily.     capmatinib (TABRECTA) 200 MG tablet Take 2 tablets (400 mg total) by mouth 2 (two) times daily. 56 tablet 1   Carboxymethylcellul-Glycerin (CLEAR EYES FOR DRY EYES) 1-0.25 % SOLN Place 1 drop into both eyes 3 (three) times daily as needed.  cyclobenzaprine (FLEXERIL) 10 MG tablet Take 1 tablet (10 mg total) by mouth 3 (three) times daily as needed for muscle spasms. (Patient not taking: Reported on 10/28/2019) 60 tablet 0   dexamethasone (DECADRON) 2 MG tablet Take 1 tablet (2 mg total) by mouth 2 (two) times daily. (Patient taking differently: Take 2 mg by mouth daily. ) 60 tablet 2   ELDERBERRY PO Take 1,250 mg by mouth daily.     hydrochlorothiazide (HYDRODIURIL) 25 MG tablet TAKE 1 TABLET(25 MG) BY MOUTH DAILY (Patient taking differently: Take 25 mg by mouth daily. ) 90 tablet 0   hydrocortisone (ANUSOL-HC) 2.5 % rectal cream Place 1 application rectally 2 (two) times daily. 30 g 0   L-Lysine 500 MG TABS Take 500 mg by mouth 2 (two) times daily as needed (cold sores/fever blisters.).     losartan (COZAAR) 100 MG tablet TAKE 1 TABLET(100 MG) BY MOUTH DAILY (Patient taking differently: Take 100 mg by mouth daily. TAKE 1 TABLET(100 MG) BY MOUTH DAILY) 90 tablet 0   ondansetron (ZOFRAN) 4 MG tablet Take 1 tablet (4 mg total) by mouth every 8 (eight) hours as needed for nausea or vomiting. (Patient not taking: Reported on 10/21/2019) 20 tablet 0   OVER THE COUNTER MEDICATION Apply 1 application topically daily as needed (shoulder pain.). [place] Gel OTC     oxyCODONE  (OXY IR/ROXICODONE) 5 MG immediate release tablet Take 1-2 tablets (5-10 mg total) by mouth every 6 (six) hours as needed for severe pain or breakthrough pain. 60 tablet 0   pantoprazole (PROTONIX) 40 MG tablet TAKE 1 TABLET(40 MG) BY MOUTH DAILY 90 tablet 3   polyethylene glycol (MIRALAX / GLYCOLAX) 17 g packet Take 17 g by mouth daily.     Probiotic Product (EQL PROBIOTIC COLON SUPPORT) CAPS Take 1 capsule by mouth daily.     rosuvastatin (CRESTOR) 5 MG tablet TAKE 1 TABLET BY MOUTH THREE TIMES WEEKLY AS DIRECTED (Patient taking differently: Take 5 mg by mouth every Monday, Wednesday, and Friday. TAKE 1 TABLET BY MOUTH THREE TIMES WEEKLY AS DIRECTED) 36 tablet 1   Vitamin D3 (VITAMIN D) 25 MCG tablet Take 2,000 Units by mouth daily.     No current facility-administered medications for this visit.    REVIEW OF SYSTEMS:   Constitutional: ( - ) fevers, ( - )  chills , ( - ) night sweats Eyes: ( - ) blurriness of vision, ( - ) double vision, ( - ) watery eyes Ears, nose, mouth, throat, and face: ( - ) mucositis, ( - ) sore throat Respiratory: ( - ) cough, ( - ) dyspnea, ( - ) wheezes Cardiovascular: ( - ) palpitation, ( - ) chest discomfort, ( - ) lower extremity swelling Gastrointestinal:  ( - ) nausea, ( - ) heartburn, ( - ) change in bowel habits Skin: ( - ) abnormal skin rashes Lymphatics: ( - ) new lymphadenopathy, ( - ) easy bruising Neurological: ( - ) numbness, ( - ) tingling, ( - ) new weaknesses Behavioral/Psych: ( - ) mood change, ( - ) new changes  All other systems were reviewed with the patient and are negative.  PHYSICAL EXAMINATION: ECOG PERFORMANCE STATUS: 1 - Symptomatic but completely ambulatory  Vitals:   10/28/19 1019  BP: 133/83  Pulse: 79  Resp: 18  Temp: (!) 97.1 F (36.2 C)  SpO2: 98%   Filed Weights   10/28/19 1019  Weight: 147 lb 14.4 oz (67.1 kg)    GENERAL: well  appearing middle aged 10 female in NAD  HEAD: large dark healing scab on  back right of head. Tender to palpation.  SKIN: skin color, texture, turgor are normal, no rashes or significant lesions EYES: conjunctiva are pink and non-injected, sclera clear LUNGS: clear to auscultation and percussion with normal breathing effort HEART: regular rate & rhythm and no murmurs and no lower extremity edema Musculoskeletal: no cyanosis of digits and no clubbing  PSYCH: alert & oriented x 3, fluent speech NEURO: no focal motor/sensory deficits   LABORATORY DATA:  I have reviewed the data as listed CBC Latest Ref Rng & Units 10/28/2019 10/24/2019 09/29/2019  WBC 4.0 - 10.5 K/uL 17.5(H) 17.5(H) 10.5  Hemoglobin 12.0 - 15.0 g/dL 12.3 12.8 12.0  Hematocrit 36.0 - 46.0 % 36.7 37.9 36.7  Platelets 150 - 400 K/uL 359 336 392    CMP Latest Ref Rng & Units 10/28/2019 10/24/2019 09/29/2019  Glucose 70 - 99 mg/dL 129(H) 174(H) 163(H)  BUN 8 - 23 mg/dL '12 11 11  '$ Creatinine 0.44 - 1.00 mg/dL 0.63 0.43(L) 0.64  Sodium 135 - 145 mmol/L 139 137 141  Potassium 3.5 - 5.1 mmol/L 3.8 3.7 3.0(L)  Chloride 98 - 111 mmol/L 102 96(L) 103  CO2 22 - 32 mmol/L '27 29 22  '$ Calcium 8.9 - 10.3 mg/dL 9.4 9.1 9.3  Total Protein 6.5 - 8.1 g/dL 7.0 7.6 6.6  Total Bilirubin 0.3 - 1.2 mg/dL <0.2(L) 0.5 0.3  Alkaline Phos 38 - 126 U/L 127(H) 147(H) 88  AST 15 - 41 U/L 18 57(H) 22  ALT 0 - 44 U/L 53(H) 134(H) 24     RADIOGRAPHIC STUDIES: I have personally reviewed the radiological images as listed and agreed with the findings in the report. MR Brain W Wo Contrast  Result Date: 10/29/2019 CLINICAL DATA:  Lung cancer with metastatic disease.  Staging. EXAM: MRI HEAD WITHOUT AND WITH CONTRAST TECHNIQUE: Multiplanar, multiecho pulse sequences of the brain and surrounding structures were obtained without and with intravenous contrast. CONTRAST:  76m GADAVIST GADOBUTROL 1 MMOL/ML IV SOLN COMPARISON:  09/30/2019 FINDINGS: Brain: 14 nodular and ring-enhancing brain masses consistent with metastatic disease. These  are labeled on series 9. 1. Left para median deep cerebellum on 9:40, 18 mm 2. Subcentimeter in the lower vermis on 9:35 3. Subcentimeter in the right deep cerebellum on 9:39 4. Subcentimeter in the upper right paramedian vermis on 9:50 5. Subcentimeter in the right occipital lobe on 9:67 6. Subcentimeter in the right parietal cortex on 9:89 7. Subcentimeter in the right occipital cortex on 9:89 8. Subcentimeter along a right parietal sulcus on 9:100 9. Punctate along high right parietal gyrus on 9:130 10. Subcentimeter in the high and posterior right frontal cortex on 9:135 11. Posterior left frontal cortical on 9:119 measuring 11 mm 12. Punctate in the parasagittal left frontal cortex on 9:118 13. Subcentimeter along anterior left frontal cortex on 9:107 14. Punctate at right temporal white matter on 9:48 Associated vasogenic edema is mild-to-moderate. In general vasogenic edema is improved, especially in the deep left cerebellum, although there is mild progression of edema in the posterior left frontal region associated with the largest metastasis. No incidental infarct, hydrocephalus, or extra-axial collection. Vascular: Normal flow voids and vascular enhancements Skull and upper cervical spine: Negative for marrow lesion Sinuses/Orbits: Negative for mass or inflammation IMPRESSION: 1. Fourteen brain metastases as described above. 2. Improved vasogenic edema except at the largest left frontal lesion where there is increased and moderate edema. Electronically Signed  By: Monte Fantasia M.D.   On: 10/29/2019 05:15   MR BRAIN W WO CONTRAST  Result Date: 09/30/2019 CLINICAL DATA:  Metastatic malignant neoplasm, unspecified site. Additional history provided: Patient with history of lung cancer EXAM: MRI HEAD WITHOUT AND WITH CONTRAST TECHNIQUE: Multiplanar, multiecho pulse sequences of the brain and surrounding structures were obtained without and with intravenous contrast. CONTRAST:  7.7m GADAVIST GADOBUTROL  1 MMOL/ML IV SOLN COMPARISON:  Head CT 09/29/2019 FINDINGS: Brain: There are multiple enhancing intracranial metastases as follows. An index peripherally enhancing metastasis within the paramedian left cerebellum measures 1.6 x 1.3 x 1.5 cm (series 12, image 37). Moderate surrounding edema. 4 mm lesion within the right cerebellum (series 12, image 37). Mild surrounding edema. Punctate lesion within the midline inferior cerebellum (series 12, image 34). 5 mm peripherally enhancing lesion within the right occipital lobe (series 12, image 61). Mild surrounding edema. 2 mm lesion within the right parietal lobe (series 12, image 86). Minimal associated edema. 3 mm peripherally enhancing lesion within right parietal lobe (series 12, image 101). Minimal associated edema. Punctate lesion within the left precentral gyrus (series 12, image 118). Mild surrounding edema. 3 mm lesion within the anterior left frontal lobe (series 12, image 121). An additional 2 mm peripherally enhancing metastasis is questioned within the quadrigeminal plate cistern along the internal cerebral veins (series 12, image 75), although this is not definite. Posterior fossa mass effect related to the moderate edema associated with the dominant left cerebellar metastasis. Partial effacement of the fourth ventricle. No evidence of hydrocephalus on the current examination. No evidence of acute infarct. No supratentorial midline shift. No extra-axial fluid collection. No chronic intracranial blood products. Additional mild scattered T2/FLAIR hyperintensity within the cerebral white matter is nonspecific, but consistent with chronic small vessel ischemic disease. Cerebral volume is normal for age. Vascular: Flow voids maintained within the proximal large arterial vessels. 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 3 mm enhancing focus within right occipital calvarium which appears to demonstrate precontrast T1 hyperintensity and is likely benign. Sinuses/Orbits: Visualized orbits demonstrate no acute abnormality. Left maxillary sinus mucous retention cyst. Minimal ethmoid sinus mucosal thickening. No significant mastoid effusion. These results will be called to the ordering clinician or representative by the Radiologist Assistant, and communication documented in the PACS or zVision Dashboard. IMPRESSION: There are least 8 metastatic lesions within the supratentorial and infratentorial brain as outlined. An index peripherally enhancing mass within the left cerebellum measures 1.6 x 1.3 x 1.5 cm. Moderate surrounding edema. Resultant posterior fossa mass effect with partial effacement of the fourth ventricle. No evidence of hydrocephalus. 10 mm focus of enhancement within the left parietal calvarium with adjacent focal dural thickening and enhancement. 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 Mild chronic small vessel ischemic disease. Electronically Signed   By: KKellie SimmeringDO   On: 09/30/2019 16:04   MR THORACIC SPINE W WO CONTRAST  Result Date: 10/20/2019 CLINICAL DATA:  Back pain. 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 EXAM: MRI THORACIC AND LUMBAR SPINE WITHOUT AND WITH CONTRAST TECHNIQUE: Multiplanar and multiecho pulse sequences of the thoracic and lumbar spine were obtained without and with intravenous contrast. CONTRAST:  682mGADAVIST GADOBUTROL 1 MMOL/ML IV SOLN COMPARISON:  PET scan dated 10/01/2019 FINDINGS: MRI THORACIC SPINE FINDINGS Alignment:  Physiologic. Vertebrae: There are metastatic lesions involving the 7 and T8 vertebra. Tumor extends into the left side of the spinal canal extending from T6-7 disc space to the T8-9 disc space with a slight mass effect upon the left side of the  thecal sac. The tumor extends into the left neural foramina at T6-7, T7-8 and T8-9. Tumor extends into the left pedicle of T8 and into the paraspinal soft tissues to the left of midline. There are small lesions in the right transverse process of T2 and in the right transverse processes of T10 and T11. There is a tiny lesion in the T3 vertebral body on STIR imaging which does not enhance after contrast. There is also a lesion in the superior aspect of L2. Cord: No myelopathy. The tumor in the left side of the spinal canal does not affect the spinal cord. Paraspinal and other soft tissues: There is abnormal enhancement of the paraspinal soft tissues to the left of midline at T7-8. There is a large mass in the right lower lobe. There is mediastinal adenopathy. Disc levels: There are no disc protrusions or significant disc bulges. No spinal or foraminal stenoses. MRI LUMBAR SPINE FINDINGS Segmentation:  Standard. Alignment:  Physiologic. Vertebrae: There is a 2 cm metastatic lesion in the superior aspect of L2 without a pathologic fracture. There is only slight enhancement of the lesion after contrast administration. There is a 19 mm lesion in the posterior aspect of the right iliac bone adjacent to the sacroiliac joint. Conus medullaris: Extends to the L1-2 level and appears normal. Paraspinal and other soft tissues: Negative. Disc levels: L1-2: The disc is normal. The tumor involves the superior aspect of L2 without a pathologic fracture. No extension of tumor into the spinal canal. L2-3: Normal. L3-4: Tiny broad-based disc bulge with no neural impingement. Slight left facet arthritis. L4-5: Tiny broad-based disc bulge. Moderate bilateral facet arthritis. L5-S1: Soft disc extrusion to the right of midline extending inferiorly behind the body of L5 on the right slightly compressing the right S1 nerve root sleeve. IMPRESSION: 1. Multiple metastatic lesions in the thoracic and lumbar spine as well as in the right iliac  bone. 2. Tumor extends into the left side of the spinal canal and into left neural foramina at T6-7, T7-8 and T8-9 with a slight mass effect upon the thecal sac at those levels. 3. No tumor extension into the spinal canal or neural foramina at the other levels of the thoracic or lumbar spine. 4. Large mass in the right lower lobe with mediastinal adenopathy. Electronically Signed   By: Lorriane Shire M.D.   On: 10/20/2019 16:52   MR Lumbar Spine W Wo Contrast  Result Date: 10/20/2019 CLINICAL DATA:  Back pain. 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 EXAM: MRI THORACIC AND LUMBAR SPINE WITHOUT AND WITH CONTRAST TECHNIQUE: Multiplanar and multiecho pulse sequences of the thoracic and lumbar spine were obtained without and with intravenous contrast. CONTRAST:  54m GADAVIST GADOBUTROL 1 MMOL/ML IV SOLN COMPARISON:  PET scan dated 10/01/2019 FINDINGS: MRI THORACIC SPINE FINDINGS Alignment:  Physiologic. Vertebrae: There are metastatic lesions involving the 7 and T8 vertebra. Tumor extends into the left side of the spinal canal extending from T6-7 disc space to the T8-9 disc space with a slight mass effect upon the left side of the thecal sac. The tumor extends into the left neural foramina at T6-7, T7-8 and T8-9. Tumor extends into the left pedicle of T8 and  into the paraspinal soft tissues to the left of midline. There are small lesions in the right transverse process of T2 and in the right transverse processes of T10 and T11. There is a tiny lesion in the T3 vertebral body on STIR imaging which does not enhance after contrast. There is also a lesion in the superior aspect of L2. Cord: No myelopathy. The tumor in the left side of the spinal canal does not affect the spinal cord. Paraspinal and other soft tissues: There is abnormal enhancement of the paraspinal soft tissues to the left of midline at T7-8. There is a large mass in the right lower lobe. There is  mediastinal adenopathy. Disc levels: There are no disc protrusions or significant disc bulges. No spinal or foraminal stenoses. MRI LUMBAR SPINE FINDINGS Segmentation:  Standard. Alignment:  Physiologic. Vertebrae: There is a 2 cm metastatic lesion in the superior aspect of L2 without a pathologic fracture. There is only slight enhancement of the lesion after contrast administration. There is a 19 mm lesion in the posterior aspect of the right iliac bone adjacent to the sacroiliac joint. Conus medullaris: Extends to the L1-2 level and appears normal. Paraspinal and other soft tissues: Negative. Disc levels: L1-2: The disc is normal. The tumor involves the superior aspect of L2 without a pathologic fracture. No extension of tumor into the spinal canal. L2-3: Normal. L3-4: Tiny broad-based disc bulge with no neural impingement. Slight left facet arthritis. L4-5: Tiny broad-based disc bulge. Moderate bilateral facet arthritis. L5-S1: Soft disc extrusion to the right of midline extending inferiorly behind the body of L5 on the right slightly compressing the right S1 nerve root sleeve. IMPRESSION: 1. Multiple metastatic lesions in the thoracic and lumbar spine as well as in the right iliac bone. 2. Tumor extends into the left side of the spinal canal and into left neural foramina at T6-7, T7-8 and T8-9 with a slight mass effect upon the thecal sac at those levels. 3. No tumor extension into the spinal canal or neural foramina at the other levels of the thoracic or lumbar spine. 4. Large mass in the right lower lobe with mediastinal adenopathy. Electronically Signed   By: Lorriane Shire M.D.   On: 10/20/2019 16:52   CT ABDOMEN PELVIS W CONTRAST  Result Date: 10/24/2019 CLINICAL DATA:  Abdomen pain with distension history of lung cancer EXAM: CT ABDOMEN AND PELVIS WITH CONTRAST TECHNIQUE: Multidetector CT imaging of the abdomen and pelvis was performed using the standard protocol following bolus administration of  intravenous contrast. CONTRAST:  145m OMNIPAQUE IOHEXOL 300 MG/ML  SOLN COMPARISON:  MRI 10/20/2019, PET-CT 10/01/2019, CT abdomen pelvis 01/21/2008 FINDINGS: Lower chest: Right lower lobe partially cavitary lung mass with postobstructive consolidations. Heart size within normal limits. Trace pericardial effusion. Hepatobiliary: No focal liver abnormality is seen. Status post cholecystectomy. No biliary dilatation. Pancreas: Unremarkable. No pancreatic ductal dilatation or surrounding inflammatory changes. Spleen: Normal in size without focal abnormality. Adrenals/Urinary Tract: Adrenal glands are normal. Slight prominence of left renal collecting system and ureter. Distended urinary bladder. Stomach/Bowel: Stomach is nonenlarged. No dilated small bowel. Moderate to large formed feces at the rectum with moderate feces in the rectosigmoid colon. Liquid stools within the remainder of the colon with slight distension of the cecum. Negative for colon wall thickening. Negative appendix. Vascular/Lymphatic: Extensive aortic atherosclerosis. No aneurysm. No significant adenopathy. Reproductive: Status post hysterectomy. No adnexal masses. Other: No free air or free fluid. Subcutaneous soft tissue nodules are noted. 5 mm subcutaneous nodule to  the right of midline slightly inferior to the umbilicus, series 2, image number 58. 7 mm left lateral abdominal wall nodule, series 2, image number 31. 12 mm lobulated nodule overlying the right hip, series 2, image number 88. Musculoskeletal: T8 vertebral metastatic lesion on the left side with some protrusion of mass into the left anterior canal. Known L2 metastatic lesion not well seen on today's CT. Faintly visible left iliac crest metastatic lesion, series 2, image number 58. other skeletal metastatic foci demonstrated on prior PET CT are not well seen by CT. IMPRESSION: 1. Negative for bowel obstruction or bowel wall thickening. Moderate to large retained feces at the rectum  with moderate stool in the rectosigmoid colon. Slight colon distension by liquid stools upstream to the rectosigmoid colon. 2. Incompletely visualized right lower lobe lung mass with postobstructive consolidations. 3. Scattered subcutaneous nodules, possible metastatic foci 4. T8 vertebral metastatic lesion and left iliac crest metastatic osseous lesion. Electronically Signed   By: Donavan Foil M.D.   On: 10/24/2019 20:52   NM PET Image Initial (PI) Skull Base To Thigh  Result Date: 10/01/2019 CLINICAL DATA:  Initial treatment strategy for pulmonary mass. EXAM: NUCLEAR MEDICINE PET SKULL BASE TO THIGH TECHNIQUE: 8.1 mCi F-18 FDG was injected intravenously. Full-ring PET imaging was performed from the skull base to thigh after the radiotracer. CT data was obtained and used for attenuation correction and anatomic localization. Fasting blood glucose: 125 mg/dl COMPARISON:  Chest CT 09/24/2019 FINDINGS: Mediastinal blood pool activity: SUV max 2.7 Liver activity: SUV max NA NECK: Lower left cervical node is hypermetabolic with SUV max = 7.5. Incidental CT findings: none CHEST: Hypermetabolic lymphadenopathy is identified in the right axilla, right thoracic inlet, mediastinum, and right hilum. Index right paratracheal node on 78/3 measures 2.6 cm short axis with SUV max = 10.5. Index subcarinal lymph node on 89/3 measures 2.3 cm with SUV max = 9.8. Left hilar lymphadenopathy is hypermetabolic with SUV max = 9.1. Right infrahilar mass lesion better characterized on recent diagnostic chest CT is hypermetabolic with SUV max = 63.8. 9 mm posterior right lower lobe nodule on 87/3 shows no discernible hypermetabolism on PET imaging. Incidental CT findings: Heart is enlarged. Trace pericardial effusion. Coronary artery calcification is evident. Atherosclerotic calcification is noted in the wall of the thoracic aorta. Please see report for recent diagnostic CT of the chest. ABDOMEN/PELVIS: No abnormal hypermetabolic  activity within the liver, pancreas, adrenal glands, or spleen. No hypermetabolic lymph nodes in the abdomen or pelvis. Incidental CT findings: Gallbladder is surgically absent.There is abdominal aortic atherosclerosis without aneurysm. SKELETON: Scattered hypermetabolic bone metastases are identified including posterior right second rib ( SUV max = 4.6), left iliac crest (SUV max = 4.6), and anterior right iliac bone ( SUV max = 5.5). Hypermetabolic subcutaneous nodules are identified. 5 mm subcutaneous nodule lateral left abdominal wall (147/3) demonstrates SUV max = 1.9. 5 mm subcutaneous nodule superficial to the right rectus sheath on image 194/3 demonstrates SUV max = 2.3. 7 mm subcutaneous nodule lateral right hip on 246/3 demonstrates SUV max = 3.4. Incidental CT findings: Above described hypermetabolic bone lesion show no CT correlate. IMPRESSION: 1. Markedly hypermetabolic infrahilar right lower lobe mass with hypermetabolic nodal metastases in the right hilum, mediastinum, right supraclavicular region, left axilla and lower left neck. 2. Hypermetabolic rib, vertebral body, and iliac bone lesions consistent with metastatic involvement. 3. Tiny hypermetabolic subcutaneous nodules also concerning for metastatic disease. 4.  Aortic Atherosclerois (ICD10-170.0) Electronically Signed   By: Randall Hiss  Tery Sanfilippo M.D.   On: 10/01/2019 15:31   DG Chest Port 1 View  Result Date: 10/24/2019 CLINICAL DATA:  Hypoxia, history of recent port placement EXAM: PORTABLE CHEST 1 VIEW COMPARISON:  09/29/2019 FINDINGS: Cardiac shadow is mildly enlarged but stable. New right-sided chest wall port is noted with catheter tip at the cavoatrial junction. Right infrahilar density with peripheral consolidation is noted consistent with the known history of lung carcinoma. No bony abnormality is noted. IMPRESSION: No acute abnormality noted. Stable right lower lobe mass lesion with peripheral consolidation. Electronically Signed   By: Inez Catalina M.D.   On: 10/24/2019 19:11   [name] CORE BIOPSY (LYMPH NODES)  Result Date: 10/08/2019 INDICATION: 64 year old with right lung mass and lymphadenopathy. Tissue diagnosis is needed. Patient has a hypermetabolic left cervical lymph node on recent PET-CT. EXAM: ULTRASOUND-GUIDED CORE BIOPSY OF LEFT CERVICAL LYMPH NODE MEDICATIONS: None. ANESTHESIA/SEDATION: None FLUOROSCOPY TIME:  None COMPLICATIONS: None immediate. PROCEDURE: The procedure was explained to the patient. The risks and benefits of the procedure were discussed and the patient's questions were addressed. Informed consent was obtained from the patient. The left side of the neck was evaluated with ultrasound. Suspicious hypoechoic lymph node was identified just lateral to the left common carotid artery. Skin was prepped with chlorhexidine and sterile field was created. Skin and soft tissues were anesthetized with 1% lidocaine. Using ultrasound guidance, 18 gauge core device was directed into the lymph node. Total of 4 core biopsies were obtained. Specimens placed in saline. Bandage placed over the puncture site. FINDINGS: Suspicious hypoechoic lymph node just posterior to the left internal jugular vein and lateral to the left common carotid artery. Lymph node measures 1.0 cm in the short axis. This lymph node corresponds with the hypermetabolic lymph node on recent PET-CT. Four adequate core biopsies were obtained. No significant bleeding or hematoma formation following the core biopsies. IMPRESSION: Ultrasound-guided core biopsies of the suspicious left cervical lymph node. Electronically Signed   By: Markus Daft M.D.   On: 10/08/2019 14:37   IR IMAGING GUIDED PORT INSERTION  Result Date: 10/23/2019 INDICATION: History of metastatic lung cancer. In need of durable intravenous access for the initiation of chemotherapy. EXAM: IMPLANTED PORT A CATH PLACEMENT WITH ULTRASOUND AND FLUOROSCOPIC GUIDANCE COMPARISON:  PET-CT-10/01/2019 MEDICATIONS: Ancef  2 gm IV; The antibiotic was administered within an appropriate time interval prior to skin puncture. ANESTHESIA/SEDATION: Moderate (conscious) sedation was employed during this procedure. A total of Versed 2 mg and Fentanyl 100 mcg was administered intravenously. Moderate Sedation Time: 22 minutes. The patient's level of consciousness and vital signs were monitored continuously by radiology nursing throughout the procedure under my direct supervision. CONTRAST:  None FLUOROSCOPY TIME:  18 seconds (7 mGy) COMPLICATIONS: None immediate. PROCEDURE: The procedure, risks, benefits, and alternatives were explained to the patient. Questions regarding the procedure were encouraged and answered. The patient understands and consents to the procedure. The right neck and chest were prepped with chlorhexidine in a sterile fashion, and a sterile drape was applied covering the operative field. Maximum barrier sterile technique with sterile gowns and gloves were used for the procedure. A timeout was performed prior to the initiation of the procedure. Local anesthesia was provided with 1% lidocaine with epinephrine. After creating a small venotomy incision, a micropuncture kit was utilized to access the internal jugular vein. Real-time ultrasound guidance was utilized for vascular access including the acquisition of a permanent ultrasound image documenting patency of the accessed vessel. The microwire was utilized to measure appropriate  catheter length. A subcutaneous port pocket was then created along the upper chest wall utilizing a combination of sharp and blunt dissection. The pocket was irrigated with sterile saline. A single lumen "ISP" sized power injectable port was chosen for placement. The 8 Fr catheter was tunneled from the port pocket site to the venotomy incision. The port was placed in the pocket. The external catheter was trimmed to appropriate length. At the venotomy, an 8 Fr peel-away sheath was placed over a  guidewire under fluoroscopic guidance. The catheter was then placed through the sheath and the sheath was removed. Final catheter positioning was confirmed and documented with a fluoroscopic spot radiograph. The port was accessed with a Huber needle, aspirated and flushed with heparinized saline. The venotomy site was closed with an interrupted 4-0 Vicryl suture. The port pocket incision was closed with interrupted 2-0 Vicryl suture and the skin was opposed with a running subcuticular 4-0 Vicryl suture. Dermabond and Steri-strips were applied to both incisions. Dressings were placed. The patient tolerated the procedure well without immediate post procedural complication. FINDINGS: After catheter placement, the tip lies within the superior cavoatrial junction. The catheter aspirates and flushes normally and is ready for immediate use. IMPRESSION: Successful placement of a right internal jugular approach power injectable Port-A-Cath. The catheter is ready for immediate use. Electronically Signed   By: Sandi Mariscal M.D.   On: 10/23/2019 10:23    ASSESSMENT & PLAN Virginia [name] 64 y.o. female with medical history significant for metastatic adenocarcinoma of the lung presents for a follow up visit. 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   Overall Mrs. Trull has better control of her pain and other symptoms today. The focus of our visit was to discuss capmatinib therapy versus immunotherapy plus chemotherapy. The patient and her husband were agreeable to starting chemotherapy.  Today we discussed capmatinib and the expected side effect from this medication. Capmatinib can cause peripheral edema, diarrhea, increased liver enzymes, drop in blood counts, fatigue, increase in serum creatinine, and rarely acute pancreatitis or acute renal failure. We also discussed the efficacy of the medication by discussing the phase II multicenter multicohort GEOMETRY mono-1 trial, which  showed the overall response rate was 68% (95% confidence interval [CI] = 48%--84%), with a complete response in 4%, and the median response duration was 12.6 months (95% CI = 5.5-25.3 months). We also noted that chemotherapy/immunotherapy can be administered after progression or intolerance of capmatinib. The study showed this medication was most effective when used in the first line setting (Among previously treated patients, the overall response rate (all partial responses) was 41% (95% CI = 29%-53%), and the median response duration was 9.7 months (95% CI = 5.5-13.0 months).   After discussion of the risks and benefit the patient and her husband opted to start this treatment. We plan to have her pick this up from the pharmacy on Monday and begin therapy and monitoring at that time.   # Metastatic Adenocarcinoma of the Lung --given the MET Exon 14 mutation the patient is eligible for targeted therapy. After discussion of the options the patient agreed to start capmatinib. --hold plans to start Carbo/Pem/Pem. This can be started if patient has progression or intolerance on oral therapy.  --plan for patient to start capmatinib '400mg'$  BID on Monday, 11/02/2019 --patient will be followed initially every 2 weeks with labs and clinic visit. These can be extended to monthly if the patient's labs and clinical  status are stable --plan for repeat CT C/A/P in late April 2021 (3 months from last scan in Jan 2021) --supportive therapy as listed below.   #Pain Control, improving --continue oxycodone '5mg'$  q6H PRN. Can increase dose to '10mg'$  q6H if pain is severe --d/c flexeril --continue to take with Senokot to prevent opioid induced constipation. Miralax PRN   #Supportive Therapy --will provide patient with Zofran '8mg'$  q8h PRN and compazine '25mg'$  q6H PRN --continue EMLA cream with port --continue to monitor   #Headache #Possible Concussion --appreciate the assistance of Dr. Mickeal Skinner in treating her brain  metastasis and symptoms. --radiation therapy scheduled to start this week.  --continue to monitor   No orders of the defined types were placed in this encounter.  All questions were answered. The patient knows to call the clinic with any problems, questions or concerns.  A total of more than 40 minutes were spent on this encounter and over half of that time was spent on counseling and coordination of care as outlined above.   Ledell Peoples, MD Department of Hematology/Oncology [place] at [facility] Phone: [phone] Pager: [phone] Email: [email or web address]  10/29/2019 2:01 PM   Literature Support:   Angelique Holm, Han JY, et al: Capmatinib [phone]) in MET ex14-mutated advanced non-small cell lung cancer: Efficacy data from the phase II GEOMETRY mono-1 study. 2019 ASCO Annual Meeting. Abstract 9004. Presented February 10, 2018.  --the overall response rate was 68% (95% confidence interval [CI] = 48%--84%), with a complete response in 4%, and the median response duration was 12.6 months (95% CI = 5.5-25.3 months).

## 2019-10-30 ENCOUNTER — Other Ambulatory Visit: Payer: Self-pay

## 2019-10-30 ENCOUNTER — Ambulatory Visit
Admission: RE | Admit: 2019-10-30 | Discharge: 2019-10-30 | Disposition: A | Discharge status: Home / Self Care | Payer: BC Managed Care – PPO | Source: Ambulatory Visit | Attending: Radiation Oncology | Admitting: Radiation Oncology

## 2019-10-30 ENCOUNTER — Ambulatory Visit: Payer: BC Managed Care – PPO

## 2019-10-30 ENCOUNTER — Encounter: Payer: Self-pay | Admitting: Hematology and Oncology

## 2019-10-30 ENCOUNTER — Ambulatory Visit: Payer: BC Managed Care – PPO | Admitting: Radiation Oncology

## 2019-10-30 VITALS — BP 105/75 | HR 86 | Temp 97.8°F | Resp 20

## 2019-10-30 DIAGNOSIS — C7931 Secondary malignant neoplasm of brain: Secondary | ICD-10-CM

## 2019-10-30 DIAGNOSIS — C3490 Malignant neoplasm of unspecified part of unspecified bronchus or lung: Secondary | ICD-10-CM | POA: Diagnosis not present

## 2019-10-30 DIAGNOSIS — Z510 Encounter for antineoplastic radiation therapy: Secondary | ICD-10-CM | POA: Diagnosis not present

## 2019-10-30 DIAGNOSIS — C7951 Secondary malignant neoplasm of bone: Secondary | ICD-10-CM | POA: Diagnosis not present

## 2019-10-30 MED ORDER — SODIUM CHLORIDE 0.9% FLUSH
10.0000 mL | Freq: Once | INTRAVENOUS | Status: AC
  Administered 2019-10-30: 10 mL via INTRAVENOUS

## 2019-10-30 MED ORDER — ALBUTEROL SULFATE HFA 108 (90 BASE) MCG/ACT IN AERS: 2.0000 | INHALATION_SPRAY | Freq: Four times a day (QID) | RESPIRATORY_TRACT | 11 refills | Status: DC | PRN

## 2019-10-30 NOTE — Progress Notes (Signed)
Pt will be taking oral chemo and radiation so I emailed [facility] requesting she reach out to the pt and inform her of the [employer].

## 2019-11-02 ENCOUNTER — Ambulatory Visit: Payer: BC Managed Care – PPO | Admitting: Radiation Oncology

## 2019-11-02 ENCOUNTER — Ambulatory Visit: Payer: BC Managed Care – PPO

## 2019-11-02 ENCOUNTER — Ambulatory Visit: Admission: RE | Admit: 2019-11-02 | Payer: BC Managed Care – PPO | Source: Ambulatory Visit

## 2019-11-02 ENCOUNTER — Telehealth: Payer: Self-pay | Admitting: *Deleted

## 2019-11-02 DIAGNOSIS — C3490 Malignant neoplasm of unspecified part of unspecified bronchus or lung: Secondary | ICD-10-CM | POA: Diagnosis not present

## 2019-11-02 DIAGNOSIS — C7951 Secondary malignant neoplasm of bone: Secondary | ICD-10-CM | POA: Diagnosis not present

## 2019-11-02 DIAGNOSIS — C7931 Secondary malignant neoplasm of brain: Secondary | ICD-10-CM | POA: Diagnosis not present

## 2019-11-02 DIAGNOSIS — Z510 Encounter for antineoplastic radiation therapy: Secondary | ICD-10-CM | POA: Diagnosis not present

## 2019-11-02 MED FILL — TABRECTA 200 MG TABS: 200 | 28 days supply | Qty: 56 | Fill #0

## 2019-11-02 NOTE — Telephone Encounter (Signed)
Oncology Nurse Navigator Documentation  Oncology Nurse Navigator Flowsheets 11/02/2019  Abnormal Finding Date -  Confirmed Diagnosis Date -  Diagnosis Status Confirmed Diagnosis Complete  Navigator Follow Up Date: -  Navigator Follow Up Reason: -  Navigator Location [facility]  Referral Date to RadOnc/MedOnc -  Navigator Encounter Type Telephone/I called Virginia [name] but was unable to reach her. I did leave vm message with my name and phone number to call.   Telephone Outgoing Call  Treatment Phase -  Barriers/Navigation Needs Education  Education Other  Interventions Education  Acuity Level 2-Minimal Needs (1-2 Barriers Identified)  Coordination of Care -  Education Method Verbal  Time Spent with Patient 15

## 2019-11-02 NOTE — Telephone Encounter (Signed)
Received call from pt's husband. He states he is calling because Virginia [name] is having problems swallowing-that food is getting "hung up" as she swallows.  This is new apparently. He is asking if this is coming from her radiation. As she c/o difficulty swallowing from higher up in her esophagus, it may not be coming from her radiation as that part of her body may not be in the radiation field. Advised to eat smaller bites and follow with increased fluids, softer foods, stay away from spicy foods, continue with her Protonix. He is agreeable to this and will call back if this symptom persists. Dr. Lorenso Courier made aware.

## 2019-11-02 NOTE — Progress Notes (Signed)
  Name: Virginia [name] MRN: [id number]  Date: 10/30/2019  DOB: 1956-03-05  SIMULATION AND TREATMENT PLANNING NOTE    ICD-10-CM   1. Brain metastases [facility])  C79.31       Radiation Oncology         [phone]) [phone] ________________________________  Name: Virginia [name] MRN: [id number]  Date: 10/30/2019  DOB: 1956-05-12  SIMULATION AND TREATMENT PLANNING NOTE    ICD-10-CM   1. Brain metastases ([place])  C79.31     DIAGNOSIS: brain metastases  NARRATIVE: The patient and her husband after much consideration decided they do not want to enroll on the clinical trial previously discussed.  They decided they prefer stereotactic radiosurgery to her brain.  We reviewed the results from her most recent MRI which shows a total now of 14 brain metastases  (Addendum -upon further review I determined that there is a 15th brain metastasis.  I confirmed this with radiology. )  We discussed the pros and cons of stereotactic radiosurgery versus whole brain radiotherapy in detail.  After much discussion the determination was made to proceed with stereotactic radiosurgery.  She will be followed closely with serial MRIs and they understand that there is a high likelihood that she will need stereotactic radiosurgery again in the future for elsewhere recurrences in the brain.  The patient was brought to the [facility].  Identity was confirmed.  All relevant records and images related to the planned course of therapy were reviewed.  The patient freely provided informed written consent to proceed with treatment after reviewing the details related to the planned course of therapy. The consent form was witnessed and verified by the simulation staff. Intravenous access was established for contrast administration. Then, the patient was set-up in a stable reproducible supine position for radiation therapy.  A relocatable thermoplastic stereotactic head frame was fabricated for precise immobilization.  CT  images were obtained.  Surface markings were placed.  The CT images were loaded into the planning software and fused with the patient's targeting MRI scan.  Then the target and avoidance structures were contoured.  Treatment planning then occurred.  The radiation prescription was entered and confirmed.  I have requested 3D planning  I have requested a DVH of the following structures: Brain stem, brain, left eye, right eye, lenses, optic chiasm, target volumes, uninvolved brain, and normal tissue.    SPECIAL TREATMENT PROCEDURE:  The planned course of therapy using radiation constitutes a special treatment procedure. Special care is required in the management of this patient for the following reasons:  High dose per fraction requiring special monitoring for increased toxicities of treatment including daily imaging.  The special nature of the planned course of radiotherapy will require increased physician supervision and oversight to ensure patient's safety with optimal treatment outcomes.  PLAN:  The patient will receive 20 Gy in 1 fraction to the 15 brain metastases with stereotactic radiosurgery.  ________________________________    [name], MD

## 2019-11-02 NOTE — Telephone Encounter (Signed)
Pt had video visit with RB on 10/29/19.

## 2019-11-03 ENCOUNTER — Ambulatory Visit: Payer: BC Managed Care – PPO

## 2019-11-03 ENCOUNTER — Other Ambulatory Visit: Payer: Self-pay

## 2019-11-03 ENCOUNTER — Ambulatory Visit: Admission: RE | Admit: 2019-11-03 | Payer: BC Managed Care – PPO | Source: Ambulatory Visit

## 2019-11-03 ENCOUNTER — Other Ambulatory Visit: Payer: Self-pay | Admitting: *Deleted

## 2019-11-03 ENCOUNTER — Other Ambulatory Visit: Payer: Self-pay | Admitting: Radiation Oncology

## 2019-11-03 ENCOUNTER — Ambulatory Visit
Admission: RE | Admit: 2019-11-03 | Discharge: 2019-11-03 | Disposition: A | Discharge status: Home / Self Care | Payer: BC Managed Care – PPO | Source: Ambulatory Visit | Admitting: Radiation Oncology

## 2019-11-03 DIAGNOSIS — C7951 Secondary malignant neoplasm of bone: Secondary | ICD-10-CM

## 2019-11-03 DIAGNOSIS — Z510 Encounter for antineoplastic radiation therapy: Secondary | ICD-10-CM | POA: Diagnosis not present

## 2019-11-03 DIAGNOSIS — C3490 Malignant neoplasm of unspecified part of unspecified bronchus or lung: Secondary | ICD-10-CM | POA: Diagnosis not present

## 2019-11-03 DIAGNOSIS — C7931 Secondary malignant neoplasm of brain: Secondary | ICD-10-CM | POA: Diagnosis not present

## 2019-11-03 MED ORDER — LIDOCAINE-PRILOCAINE 2.5-2.5 % EX CREA: 1.0000 "application " | TOPICAL_CREAM | CUTANEOUS | 0 refills | Status: DC | PRN

## 2019-11-03 MED ORDER — SUCRALFATE 1 G PO TABS: ORAL_TABLET | ORAL | 3 refills | Status: DC

## 2019-11-04 ENCOUNTER — Ambulatory Visit: Payer: BC Managed Care – PPO

## 2019-11-04 ENCOUNTER — Ambulatory Visit: Admission: RE | Admit: 2019-11-04 | Payer: BC Managed Care – PPO | Source: Ambulatory Visit

## 2019-11-04 DIAGNOSIS — Z510 Encounter for antineoplastic radiation therapy: Secondary | ICD-10-CM | POA: Diagnosis not present

## 2019-11-04 DIAGNOSIS — C7951 Secondary malignant neoplasm of bone: Secondary | ICD-10-CM | POA: Diagnosis not present

## 2019-11-04 DIAGNOSIS — C3490 Malignant neoplasm of unspecified part of unspecified bronchus or lung: Secondary | ICD-10-CM | POA: Diagnosis not present

## 2019-11-04 DIAGNOSIS — C7931 Secondary malignant neoplasm of brain: Secondary | ICD-10-CM | POA: Diagnosis not present

## 2019-11-05 ENCOUNTER — Ambulatory Visit: Payer: BC Managed Care – PPO | Admitting: Radiation Oncology

## 2019-11-05 ENCOUNTER — Ambulatory Visit: Payer: BC Managed Care – PPO

## 2019-11-05 ENCOUNTER — Telehealth: Payer: Self-pay | Admitting: Hematology and Oncology

## 2019-11-05 ENCOUNTER — Ambulatory Visit
Admission: RE | Admit: 2019-11-05 | Discharge: 2019-11-05 | Disposition: A | Discharge status: Home / Self Care | Payer: BC Managed Care – PPO | Source: Ambulatory Visit | Attending: Radiation Oncology | Admitting: Radiation Oncology

## 2019-11-05 ENCOUNTER — Other Ambulatory Visit: Payer: Self-pay

## 2019-11-05 DIAGNOSIS — C7931 Secondary malignant neoplasm of brain: Secondary | ICD-10-CM | POA: Diagnosis not present

## 2019-11-05 DIAGNOSIS — C7951 Secondary malignant neoplasm of bone: Secondary | ICD-10-CM | POA: Diagnosis not present

## 2019-11-05 DIAGNOSIS — C3490 Malignant neoplasm of unspecified part of unspecified bronchus or lung: Secondary | ICD-10-CM | POA: Diagnosis not present

## 2019-11-05 DIAGNOSIS — Z510 Encounter for antineoplastic radiation therapy: Secondary | ICD-10-CM | POA: Diagnosis not present

## 2019-11-05 NOTE — Telephone Encounter (Signed)
Rescheduled 3/8 appt due to provider pal. Left a voicemail with new appt date and time. I also mailed a reminder letter and calendar.

## 2019-11-06 ENCOUNTER — Other Ambulatory Visit: Payer: Self-pay

## 2019-11-06 ENCOUNTER — Other Ambulatory Visit: Payer: Self-pay | Admitting: *Deleted

## 2019-11-06 ENCOUNTER — Ambulatory Visit
Admission: RE | Admit: 2019-11-06 | Discharge: 2019-11-06 | Disposition: A | Discharge status: Home / Self Care | Payer: BC Managed Care – PPO | Source: Ambulatory Visit | Attending: Radiation Oncology | Admitting: Radiation Oncology

## 2019-11-06 ENCOUNTER — Ambulatory Visit: Payer: BC Managed Care – PPO

## 2019-11-06 ENCOUNTER — Other Ambulatory Visit: Payer: Self-pay | Admitting: Hematology and Oncology

## 2019-11-06 VITALS — BP 120/83 | HR 71 | Temp 97.8°F | Resp 20

## 2019-11-06 DIAGNOSIS — Z510 Encounter for antineoplastic radiation therapy: Secondary | ICD-10-CM | POA: Diagnosis not present

## 2019-11-06 DIAGNOSIS — C7951 Secondary malignant neoplasm of bone: Secondary | ICD-10-CM | POA: Diagnosis not present

## 2019-11-06 DIAGNOSIS — C7931 Secondary malignant neoplasm of brain: Secondary | ICD-10-CM

## 2019-11-06 DIAGNOSIS — C3490 Malignant neoplasm of unspecified part of unspecified bronchus or lung: Secondary | ICD-10-CM | POA: Diagnosis not present

## 2019-11-06 MED ORDER — OXYCODONE HCL 5 MG PO TABS: 5.0000 mg | ORAL_TABLET | Freq: Four times a day (QID) | ORAL | 0 refills | Status: DC | PRN

## 2019-11-06 MED ORDER — OXYCODONE HCL 5 MG PO TABS: 5.0000 mg | ORAL_TABLET | ORAL | 0 refills | Status: DC | PRN

## 2019-11-06 MED ORDER — TRAZODONE HCL 50 MG PO TABS: 50.0000 mg | ORAL_TABLET | Freq: Every day | ORAL | 3 refills | Status: DC

## 2019-11-06 NOTE — Progress Notes (Signed)
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 [name] (radiation oncology) prior to treatment.  It showed 3-dimensional radiation distributions overlaid onto the planning CT/MRI image set.  The [facility] for the target structures as well as the organs at risk were reviewed. The documentation of the 3D plan and dosimetry are filed in the radiation oncology EMR.  NARRATIVE:  Virginia [name] was brought to the TrueBeam stereotactic radiation treatment machine and placed supine on the CT couch. The head frame was applied, and the patient was set up for stereotactic radiosurgery.  I was present  (remotely  via Webex, due to the ongoing coronavirus pandemic) for the set-up and delivery.  SIMULATION VERIFICATION:  In the couch zero-angle position, the patient underwent Exactrac imaging using the Brainlab system with orthogonal KV images.  These were carefully aligned and repeated to confirm treatment position for each of the isocenters.  The Exactrac snap film verification was repeated at each couch angle.  SPECIAL TREATMENT PROCEDURE: Virginia [name] received stereotactic radiosurgery, using a single isocenter, multi target technique to the following targets: multiple (15) supra and infratentorial targets were treated using 7 Rapid Arc VMAT Beams to a prescription dose of 20 Gy for each metastasis.  ExacTrac registration was performed for each couch angle.  The 100% isodose line was prescribed.  STEREOTACTIC TREATMENT MANAGEMENT:  Following delivery, the patient was transported to  nursing in stable condition and monitored for possible acute effects.  Vital signs were recorded.  The patient tolerated treatment without significant acute effects, and was discharged to home in stable condition.    PLAN: Follow-up in one month.

## 2019-11-06 NOTE — Telephone Encounter (Signed)
TCT pt's husband, [name] to inquire about her oral chemo-capmatinib. He states they picked it up Wednesday and she started it yesterday. She is tolerating it well at this point. Advised that Dr. Lorenso Courier would send in her oxycodone refill this afternoon.

## 2019-11-06 NOTE — Telephone Encounter (Signed)
Received call from patient's husband, [name]. He states Virginia [name] will need refill on her oxycodone 5 mg. Pharmacy is the [facility] @ [address]  Last filled on on 10/23/19

## 2019-11-07 ENCOUNTER — Other Ambulatory Visit: Payer: Self-pay | Admitting: Internal Medicine

## 2019-11-09 ENCOUNTER — Other Ambulatory Visit: Payer: Self-pay | Admitting: Radiation Oncology

## 2019-11-09 ENCOUNTER — Inpatient Hospital Stay: Payer: BC Managed Care – PPO | Attending: Hematology and Oncology | Admitting: Medical

## 2019-11-09 ENCOUNTER — Ambulatory Visit
Admission: RE | Admit: 2019-11-09 | Discharge: 2019-11-09 | Disposition: A | Discharge status: Home / Self Care | Payer: BC Managed Care – PPO | Source: Ambulatory Visit | Attending: Radiation Oncology | Admitting: Radiation Oncology

## 2019-11-09 ENCOUNTER — Ambulatory Visit (HOSPITAL_COMMUNITY)
Admission: RE | Admit: 2019-11-09 | Discharge: 2019-11-09 | Disposition: A | Discharge status: Home / Self Care | Payer: BC Managed Care – PPO | Source: Ambulatory Visit | Attending: Radiation Oncology | Admitting: Radiation Oncology

## 2019-11-09 ENCOUNTER — Other Ambulatory Visit: Payer: Self-pay

## 2019-11-09 ENCOUNTER — Ambulatory Visit: Payer: BC Managed Care – PPO

## 2019-11-09 VITALS — BP 117/79 | HR 77 | Temp 98.3°F | Resp 16 | Ht 62.0 in | Wt 142.1 lb

## 2019-11-09 DIAGNOSIS — Z814 Family history of other substance abuse and dependence: Secondary | ICD-10-CM | POA: Diagnosis not present

## 2019-11-09 DIAGNOSIS — C7931 Secondary malignant neoplasm of brain: Secondary | ICD-10-CM | POA: Insufficient documentation

## 2019-11-09 DIAGNOSIS — R110 Nausea: Secondary | ICD-10-CM | POA: Insufficient documentation

## 2019-11-09 DIAGNOSIS — R918 Other nonspecific abnormal finding of lung field: Secondary | ICD-10-CM | POA: Diagnosis not present

## 2019-11-09 DIAGNOSIS — Z79899 Other long term (current) drug therapy: Secondary | ICD-10-CM | POA: Diagnosis not present

## 2019-11-09 DIAGNOSIS — Z7901 Long term (current) use of anticoagulants: Secondary | ICD-10-CM | POA: Insufficient documentation

## 2019-11-09 DIAGNOSIS — Z9049 Acquired absence of other specified parts of digestive tract: Secondary | ICD-10-CM | POA: Diagnosis not present

## 2019-11-09 DIAGNOSIS — I82452 Acute embolism and thrombosis of left peroneal vein: Secondary | ICD-10-CM | POA: Diagnosis not present

## 2019-11-09 DIAGNOSIS — I10 Essential (primary) hypertension: Secondary | ICD-10-CM | POA: Diagnosis not present

## 2019-11-09 DIAGNOSIS — M549 Dorsalgia, unspecified: Secondary | ICD-10-CM | POA: Insufficient documentation

## 2019-11-09 DIAGNOSIS — C3431 Malignant neoplasm of lower lobe, right bronchus or lung: Secondary | ICD-10-CM | POA: Diagnosis not present

## 2019-11-09 DIAGNOSIS — C7951 Secondary malignant neoplasm of bone: Secondary | ICD-10-CM | POA: Insufficient documentation

## 2019-11-09 DIAGNOSIS — Z86718 Personal history of other venous thrombosis and embolism: Secondary | ICD-10-CM | POA: Diagnosis not present

## 2019-11-09 DIAGNOSIS — Z8249 Family history of ischemic heart disease and other diseases of the circulatory system: Secondary | ICD-10-CM | POA: Diagnosis not present

## 2019-11-09 DIAGNOSIS — C770 Secondary and unspecified malignant neoplasm of lymph nodes of head, face and neck: Secondary | ICD-10-CM | POA: Insufficient documentation

## 2019-11-09 DIAGNOSIS — Z510 Encounter for antineoplastic radiation therapy: Secondary | ICD-10-CM | POA: Diagnosis not present

## 2019-11-09 DIAGNOSIS — Z923 Personal history of irradiation: Secondary | ICD-10-CM | POA: Diagnosis not present

## 2019-11-09 DIAGNOSIS — Z87891 Personal history of nicotine dependence: Secondary | ICD-10-CM | POA: Diagnosis not present

## 2019-11-09 DIAGNOSIS — C3490 Malignant neoplasm of unspecified part of unspecified bronchus or lung: Secondary | ICD-10-CM | POA: Diagnosis not present

## 2019-11-09 DIAGNOSIS — Z809 Family history of malignant neoplasm, unspecified: Secondary | ICD-10-CM | POA: Diagnosis not present

## 2019-11-09 DIAGNOSIS — E876 Hypokalemia: Secondary | ICD-10-CM | POA: Diagnosis not present

## 2019-11-09 DIAGNOSIS — Z9221 Personal history of antineoplastic chemotherapy: Secondary | ICD-10-CM | POA: Diagnosis not present

## 2019-11-09 MED ORDER — APIXABAN 5 MG PO TABS: ORAL_TABLET | ORAL | 0 refills | Status: DC

## 2019-11-09 MED ORDER — ONDANSETRON HCL 4 MG PO TABS: 4.0000 mg | ORAL_TABLET | Freq: Three times a day (TID) | ORAL | 0 refills | Status: DC | PRN

## 2019-11-09 MED FILL — ELIQUIS 5 MG TABLET: 5 | 23 days supply | Qty: 60 | Fill #0

## 2019-11-09 MED FILL — ONDANSETRON HCL 4 MG TABLET: 4 | 6 days supply | Qty: 20 | Fill #0

## 2019-11-09 NOTE — Progress Notes (Signed)
VASCULAR LAB PRELIMINARY  PRELIMINARY  PRELIMINARY  PRELIMINARY  Left lower extremity venous duplex completed.    Preliminary report:  See CV proc for preliminary results.  Called report to Dr. Teresa Pelton, [facility], RVT 11/09/2019, 2:28 PM

## 2019-11-09 NOTE — Progress Notes (Signed)
[facility] Progress Note   Virginia [name] [id number] Oct 20, 1955 64 y.o.  Virginia [name] is managed by Dr. Narda Rutherford  Actively treated with chemotherapy/immunotherapy/hormonal therapy: yes  Current therapy: concurrent chemoradiation with capmatinib  Next scheduled appointment with provider: 11/18/2019  Assessment: Plan:    Acute deep vein thrombosis (DVT) of left peroneal vein ([place]) - Plan: apixaban (ELIQUIS) 5 MG TABS tablet  Nausea without vomiting - Plan: ondansetron (ZOFRAN) 4 MG tablet  Right lower lobe lung mass  Brain metastases ([place])  Bone metastases ([place])   Left acute DVT: The patient was seen with Dr. Lorenso Courier and was begun on Eliquis 10 mg BID x 7 days then 5 mg BID. A starter pack was sent to her pharmacy. She was told to call for a refill when needed.  Nausea: A refill of Zofran was sent to her pharmacy.  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 Lorenso Courier and is treated with concurrent chemoradiation with capmatinib. She is scheduled to be seen in follow up on 11/18/2019.  Please see After Visit Summary for patient specific instructions.  Future Appointments  Date Time Provider [place]  11/10/2019 11:40 AM [facility] LINAC 4 [facility] None  11/11/2019 11:45 AM [facility] LINAC 4 [facility] None  11/18/2019  9:15 AM [facility] LAB 6 [facility] None  11/18/2019  9:30 AM [facility] [place] FLUSH [facility] None  11/18/2019 10:00 AM Orson Slick, MD [facility] None  12/04/2019 10:40 AM [name], MD [facility] None  12/04/2019  2:40 PM [name], MD [facility] None  12/23/2019 11:40 AM [name], MD [facility] None    No orders of the defined types were placed in this encounter.      Subjective:   Patient ID:  Virginia [name] is a 64 y.o. (DOB 18-Aug-1956) female.  Chief Complaint: No chief complaint on file.   HPI Virginia [name]  Is a 64 y.o.  female with a diagnosis of a metastatic adenocarcinoma of the lung with brain and bone metastasis. She is managed by Dr. Lorenso Courier and is treated with concurrent chemoradiation with capmatinib. She stated capmatinib on Thursday and began radiation to her brain and spine on Friday. She had some nausea on Friday. She developed left lower extremity pain on Saturday. She was referred for a Doppler US of her left lower extremity today with results showing:   Summary:  RIGHT:  - No evidence of common femoral vein obstruction.    LEFT:  - Findings consistent with acute deep vein thrombosis involving the left popliteal vein, left posterior tibial veins, and left peroneal veins.   She has never had a blood clot, PE, or been on an anticoagulant. She denies SOB, GI bleeding, or abdominal pain. She reports slight epitaxis with nose blowing.  Medications: I have reviewed the patient's current medications.  Allergies: No Known Allergies  Past Medical History:  Diagnosis Date  . Anxiety   . Cancer ([place])   . Dyspnea   . GERD (gastroesophageal reflux disease)   . Hypercholesterolemia    per pt, she does not have elevated lipids  . Hypertension   . Tobacco abuse     Past Surgical History:  Procedure Laterality Date  . ABDOMINAL HYSTERECTOMY     partial/ left ovaries  . CHOLECYSTECTOMY    . DILATION AND CURETTAGE OF UTERUS    . IR IMAGING GUIDED PORT INSERTION  10/23/2019  . LIPOMA EXCISION  2018  removed under left breast and right thigh.  . TUBAL LIGATION      Family History  Problem Relation Age of Onset  . Heart disease Father   . Drug abuse Daughter   . Drug abuse Son   . Cancer Sister   . Heart disease Brother   . Heart attack Brother     Social History   Socioeconomic History  . Marital status: Married    Spouse name: Not on file  . Number of children: 3  . Years of education: Not on file  . Highest education level: Not on file  Occupational History  . Occupation: owns  Medical illustrator: [employer]  Tobacco Use  . Smoking status: Former Smoker    Packs/day: 0.25    Quit date: 10/31/2012    Years since quitting: 7.0  . Smokeless tobacco: Never Used  Substance and Sexual Activity  . Alcohol use: Yes    Alcohol/week: 0.0 standard drinks    Comment: rare  . Drug use: No  . Sexual activity: Yes  Other Topics Concern  . Not on file  Social History Narrative  . Not on file   Social Determinants of Health   Financial Resource Strain:   . Difficulty of Paying Living Expenses: Not on file  Food Insecurity:   . Worried About Charity fundraiser in the Last Year: Not on file  . Ran Out of Food in the Last Year: Not on file  Transportation Needs:   . Lack of Transportation (Medical): Not on file  . Lack of Transportation (Non-Medical): Not on file  Physical Activity:   . Days of Exercise per Week: Not on file  . Minutes of Exercise per Session: Not on file  Stress:   . Feeling of Stress : Not on file  Social Connections:   . Frequency of Communication with Friends and Family: Not on file  . Frequency of Social Gatherings with Friends and Family: Not on file  . Attends Religious Services: Not on file  . Active Member of Clubs or Organizations: Not on file  . Attends Archivist Meetings: Not on file  . Marital Status: Not on file  Intimate Partner Violence:   . Fear of Current or Ex-Partner: Not on file  . Emotionally Abused: Not on file  . Physically Abused: Not on file  . Sexually Abused: Not on file    Past Medical History, Surgical history, Social history, and Family history were reviewed and updated as appropriate.   Please see review of systems for further details on the patient's review from today.   Review of Systems:  Review of Systems  Constitutional: Negative for chills, diaphoresis and fever.  HENT: Negative for trouble swallowing and voice change.   Respiratory: Negative for cough, chest tightness, shortness  of breath and wheezing.   Cardiovascular: Positive for leg swelling (left leg pain). Negative for chest pain and palpitations.  Gastrointestinal: Negative for abdominal pain, anal bleeding, constipation, diarrhea, nausea and vomiting.  Musculoskeletal: Negative for back pain and myalgias.  Neurological: Negative for dizziness, light-headedness and headaches.    Objective:   Physical Exam:  BP 117/79 (BP Location: Right Arm, Patient Position: Sitting)   Pulse 77   Temp 98.3 F (36.8 C) (Temporal)   Resp 16   Ht _0  (1.575 m)   Wt 142 lb 1.6 oz (64.5 kg)   SpO2 100%   BMI 25.99 kg/m  ECOG: 1  Physical Exam  Constitutional:      General: She is not in acute distress.    Appearance: She is not diaphoretic.  HENT:     Head: Normocephalic and atraumatic.  Eyes:     General: No scleral icterus.       Right eye: No discharge.        Left eye: No discharge.     Conjunctiva/sclera: Conjunctivae normal.  Cardiovascular:     Rate and Rhythm: Normal rate and regular rhythm.     Heart sounds: Normal heart sounds. No murmur. No friction rub. No gallop.   Pulmonary:     Effort: Pulmonary effort is normal. No respiratory distress.     Breath sounds: Normal breath sounds. No stridor. No wheezing or rales.  Skin:    General: Skin is warm and dry.     Coloration: Skin is not pale.     Findings: No erythema.  Neurological:     Mental Status: She is alert.     Gait: Gait abnormal (The patient is ambulating with a wheelchair).     Lab Review:     Component Value Date/Time   NA 139 10/28/2019 0959   K 3.8 10/28/2019 0959   CL 102 10/28/2019 0959   CO2 27 10/28/2019 0959   GLUCOSE 129 (H) 10/28/2019 0959   BUN 12 10/28/2019 0959   CREATININE 0.63 10/28/2019 0959   CREATININE 0.58 11/15/2017 0948   CALCIUM 9.4 10/28/2019 0959   PROT 7.0 10/28/2019 0959   ALBUMIN 2.7 (L) 10/28/2019 0959   AST 18 10/28/2019 0959   ALT 53 (H) 10/28/2019 0959   ALKPHOS 127 (H) 10/28/2019 0959    BILITOT <0.2 (L) 10/28/2019 0959   GFRNONAA >60 10/28/2019 0959   GFRNONAA 100 11/15/2017 0948   GFRAA >60 10/28/2019 0959   GFRAA 116 11/15/2017 0948       Component Value Date/Time   WBC 17.5 (H) 10/28/2019 0959   WBC 17.5 (H) 10/24/2019 1810   RBC 3.99 10/28/2019 0959   HGB 12.3 10/28/2019 0959   HCT 36.7 10/28/2019 0959   PLT 359 10/28/2019 0959   MCV 92.0 10/28/2019 0959   MCH 30.8 10/28/2019 0959   MCHC 33.5 10/28/2019 0959   RDW 12.0 10/28/2019 0959   LYMPHSABS 1.7 10/28/2019 0959   MONOABS 1.4 (H) 10/28/2019 0959   EOSABS 0.6 (H) 10/28/2019 0959   BASOSABS 0.1 10/28/2019 0959   -------------------------------  Imaging from last 24 hours (if applicable):  Radiology interpretation: MR Brain W Wo Contrast  Addendum Date: 11/03/2019   ADDENDUM REPORT: 11/03/2019 04:00 ADDENDUM: A 15th metastasis is identified in the right occipital cortex, 3 mm on 9:72. The radiation oncology service is already aware. Electronically Signed   By: Monte Fantasia M.D.   On: 11/03/2019 04:00   Result Date: 11/03/2019 CLINICAL DATA:  Lung cancer with metastatic disease.  Staging. EXAM: MRI HEAD WITHOUT AND WITH CONTRAST TECHNIQUE: Multiplanar, multiecho pulse sequences of the brain and surrounding structures were obtained without and with intravenous contrast. CONTRAST:  3m GADAVIST GADOBUTROL 1 MMOL/ML IV SOLN COMPARISON:  09/30/2019 FINDINGS: Brain: 14 nodular and ring-enhancing brain masses consistent with metastatic disease. These are labeled on series 9. 1. Left para median deep cerebellum on 9:40, 18 mm 2. Subcentimeter in the lower vermis on 9:35 3. Subcentimeter in the right deep cerebellum on 9:39 4. Subcentimeter in the upper right paramedian vermis on 9:50 5. Subcentimeter in the right occipital lobe on 9:67 6. Subcentimeter in the right parietal cortex on  9:89 7. Subcentimeter in the right occipital cortex on 9:89 8. Subcentimeter along a right parietal sulcus on 9:100 9. Punctate along  high right parietal gyrus on 9:130 10. Subcentimeter in the high and posterior right frontal cortex on 9:135 11. Posterior left frontal cortical on 9:119 measuring 11 mm 12. Punctate in the parasagittal left frontal cortex on 9:118 13. Subcentimeter along anterior left frontal cortex on 9:107 14. Punctate at right temporal white matter on 9:48 Associated vasogenic edema is mild-to-moderate. In general vasogenic edema is improved, especially in the deep left cerebellum, although there is mild progression of edema in the posterior left frontal region associated with the largest metastasis. No incidental infarct, hydrocephalus, or extra-axial collection. Vascular: Normal flow voids and vascular enhancements Skull and upper cervical spine: Negative for marrow lesion Sinuses/Orbits: Negative for mass or inflammation IMPRESSION: 1. Fourteen brain metastases as described above. 2. Improved vasogenic edema except at the largest left frontal lesion where there is increased and moderate edema. Electronically Signed: By: Monte Fantasia M.D. On: 10/29/2019 05:15   MR THORACIC SPINE W WO CONTRAST  Result Date: 10/20/2019 CLINICAL DATA:  Back pain. 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 EXAM: MRI THORACIC AND LUMBAR SPINE WITHOUT AND WITH CONTRAST TECHNIQUE: Multiplanar and multiecho pulse sequences of the thoracic and lumbar spine were obtained without and with intravenous contrast. CONTRAST:  38m GADAVIST GADOBUTROL 1 MMOL/ML IV SOLN COMPARISON:  PET scan dated 10/01/2019 FINDINGS: MRI THORACIC SPINE FINDINGS Alignment:  Physiologic. Vertebrae: There are metastatic lesions involving the 7 and T8 vertebra. Tumor extends into the left side of the spinal canal extending from T6-7 disc space to the T8-9 disc space with a slight mass effect upon the left side of the thecal sac. The tumor extends into the left neural foramina at T6-7, T7-8 and T8-9. Tumor extends into the left  pedicle of T8 and into the paraspinal soft tissues to the left of midline. There are small lesions in the right transverse process of T2 and in the right transverse processes of T10 and T11. There is a tiny lesion in the T3 vertebral body on STIR imaging which does not enhance after contrast. There is also a lesion in the superior aspect of L2. Cord: No myelopathy. The tumor in the left side of the spinal canal does not affect the spinal cord. Paraspinal and other soft tissues: There is abnormal enhancement of the paraspinal soft tissues to the left of midline at T7-8. There is a large mass in the right lower lobe. There is mediastinal adenopathy. Disc levels: There are no disc protrusions or significant disc bulges. No spinal or foraminal stenoses. MRI LUMBAR SPINE FINDINGS Segmentation:  Standard. Alignment:  Physiologic. Vertebrae: There is a 2 cm metastatic lesion in the superior aspect of L2 without a pathologic fracture. There is only slight enhancement of the lesion after contrast administration. There is a 19 mm lesion in the posterior aspect of the right iliac bone adjacent to the sacroiliac joint. Conus medullaris: Extends to the L1-2 level and appears normal. Paraspinal and other soft tissues: Negative. Disc levels: L1-2: The disc is normal. The tumor involves the superior aspect of L2 without a pathologic fracture. No extension of tumor into the spinal canal. L2-3: Normal. L3-4: Tiny broad-based disc bulge with no neural impingement. Slight left facet arthritis. L4-5: Tiny broad-based disc bulge. Moderate bilateral facet arthritis. L5-S1: Soft disc extrusion to the right of midline extending inferiorly behind the body  of L5 on the right slightly compressing the right S1 nerve root sleeve. IMPRESSION: 1. Multiple metastatic lesions in the thoracic and lumbar spine as well as in the right iliac bone. 2. Tumor extends into the left side of the spinal canal and into left neural foramina at T6-7, T7-8 and  T8-9 with a slight mass effect upon the thecal sac at those levels. 3. No tumor extension into the spinal canal or neural foramina at the other levels of the thoracic or lumbar spine. 4. Large mass in the right lower lobe with mediastinal adenopathy. Electronically Signed   By: Lorriane Shire M.D.   On: 10/20/2019 16:52   MR Lumbar Spine W Wo Contrast  Result Date: 10/20/2019 CLINICAL DATA:  Back pain. 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 EXAM: MRI THORACIC AND LUMBAR SPINE WITHOUT AND WITH CONTRAST TECHNIQUE: Multiplanar and multiecho pulse sequences of the thoracic and lumbar spine were obtained without and with intravenous contrast. CONTRAST:  4m GADAVIST GADOBUTROL 1 MMOL/ML IV SOLN COMPARISON:  PET scan dated 10/01/2019 FINDINGS: MRI THORACIC SPINE FINDINGS Alignment:  Physiologic. Vertebrae: There are metastatic lesions involving the 7 and T8 vertebra. Tumor extends into the left side of the spinal canal extending from T6-7 disc space to the T8-9 disc space with a slight mass effect upon the left side of the thecal sac. The tumor extends into the left neural foramina at T6-7, T7-8 and T8-9. Tumor extends into the left pedicle of T8 and into the paraspinal soft tissues to the left of midline. There are small lesions in the right transverse process of T2 and in the right transverse processes of T10 and T11. There is a tiny lesion in the T3 vertebral body on STIR imaging which does not enhance after contrast. There is also a lesion in the superior aspect of L2. Cord: No myelopathy. The tumor in the left side of the spinal canal does not affect the spinal cord. Paraspinal and other soft tissues: There is abnormal enhancement of the paraspinal soft tissues to the left of midline at T7-8. There is a large mass in the right lower lobe. There is mediastinal adenopathy. Disc levels: There are no disc protrusions or significant disc bulges. No spinal or foraminal  stenoses. MRI LUMBAR SPINE FINDINGS Segmentation:  Standard. Alignment:  Physiologic. Vertebrae: There is a 2 cm metastatic lesion in the superior aspect of L2 without a pathologic fracture. There is only slight enhancement of the lesion after contrast administration. There is a 19 mm lesion in the posterior aspect of the right iliac bone adjacent to the sacroiliac joint. Conus medullaris: Extends to the L1-2 level and appears normal. Paraspinal and other soft tissues: Negative. Disc levels: L1-2: The disc is normal. The tumor involves the superior aspect of L2 without a pathologic fracture. No extension of tumor into the spinal canal. L2-3: Normal. L3-4: Tiny broad-based disc bulge with no neural impingement. Slight left facet arthritis. L4-5: Tiny broad-based disc bulge. Moderate bilateral facet arthritis. L5-S1: Soft disc extrusion to the right of midline extending inferiorly behind the body of L5 on the right slightly compressing the right S1 nerve root sleeve. IMPRESSION: 1. Multiple metastatic lesions in the thoracic and lumbar spine as well as in the right iliac bone. 2. Tumor extends into the left side of the spinal canal and into left neural foramina at T6-7, T7-8 and T8-9 with a slight mass effect upon the thecal sac at those levels. 3. No  tumor extension into the spinal canal or neural foramina at the other levels of the thoracic or lumbar spine. 4. Large mass in the right lower lobe with mediastinal adenopathy. Electronically Signed   By: Lorriane Shire M.D.   On: 10/20/2019 16:52   CT ABDOMEN PELVIS W CONTRAST  Result Date: 10/24/2019 CLINICAL DATA:  Abdomen pain with distension history of lung cancer EXAM: CT ABDOMEN AND PELVIS WITH CONTRAST TECHNIQUE: Multidetector CT imaging of the abdomen and pelvis was performed using the standard protocol following bolus administration of intravenous contrast. CONTRAST:  143m OMNIPAQUE IOHEXOL 300 MG/ML  SOLN COMPARISON:  MRI 10/20/2019, PET-CT 10/01/2019, CT  abdomen pelvis 01/21/2008 FINDINGS: Lower chest: Right lower lobe partially cavitary lung mass with postobstructive consolidations. Heart size within normal limits. Trace pericardial effusion. Hepatobiliary: No focal liver abnormality is seen. Status post cholecystectomy. No biliary dilatation. Pancreas: Unremarkable. No pancreatic ductal dilatation or surrounding inflammatory changes. Spleen: Normal in size without focal abnormality. Adrenals/Urinary Tract: Adrenal glands are normal. Slight prominence of left renal collecting system and ureter. Distended urinary bladder. Stomach/Bowel: Stomach is nonenlarged. No dilated small bowel. Moderate to large formed feces at the rectum with moderate feces in the rectosigmoid colon. Liquid stools within the remainder of the colon with slight distension of the cecum. Negative for colon wall thickening. Negative appendix. Vascular/Lymphatic: Extensive aortic atherosclerosis. No aneurysm. No significant adenopathy. Reproductive: Status post hysterectomy. No adnexal masses. Other: No free air or free fluid. Subcutaneous soft tissue nodules are noted. 5 mm subcutaneous nodule to the right of midline slightly inferior to the umbilicus, series 2, image number 58. 7 mm left lateral abdominal wall nodule, series 2, image number 31. 12 mm lobulated nodule overlying the right hip, series 2, image number 88. Musculoskeletal: T8 vertebral metastatic lesion on the left side with some protrusion of mass into the left anterior canal. Known L2 metastatic lesion not well seen on today's CT. Faintly visible left iliac crest metastatic lesion, series 2, image number 58. other skeletal metastatic foci demonstrated on prior PET CT are not well seen by CT. IMPRESSION: 1. Negative for bowel obstruction or bowel wall thickening. Moderate to large retained feces at the rectum with moderate stool in the rectosigmoid colon. Slight colon distension by liquid stools upstream to the rectosigmoid colon. 2.  Incompletely visualized right lower lobe lung mass with postobstructive consolidations. 3. Scattered subcutaneous nodules, possible metastatic foci 4. T8 vertebral metastatic lesion and left iliac crest metastatic osseous lesion. Electronically Signed   By: KDonavan FoilM.D.   On: 10/24/2019 20:52   DG Chest Port 1 View  Result Date: 10/24/2019 CLINICAL DATA:  Hypoxia, history of recent port placement EXAM: PORTABLE CHEST 1 VIEW COMPARISON:  09/29/2019 FINDINGS: Cardiac shadow is mildly enlarged but stable. New right-sided chest wall port is noted with catheter tip at the cavoatrial junction. Right infrahilar density with peripheral consolidation is noted consistent with the known history of lung carcinoma. No bony abnormality is noted. IMPRESSION: No acute abnormality noted. Stable right lower lobe mass lesion with peripheral consolidation. Electronically Signed   By: MInez CatalinaM.D.   On: 10/24/2019 19:11   IR IMAGING GUIDED PORT INSERTION  Result Date: 10/23/2019 INDICATION: History of metastatic lung cancer. In need of durable intravenous access for the initiation of chemotherapy. EXAM: IMPLANTED PORT A CATH PLACEMENT WITH ULTRASOUND AND FLUOROSCOPIC GUIDANCE COMPARISON:  PET-CT-10/01/2019 MEDICATIONS: Ancef 2 gm IV; The antibiotic was administered within an appropriate time interval prior to skin puncture. ANESTHESIA/SEDATION: Moderate (conscious) sedation  was employed during this procedure. A total of Versed 2 mg and Fentanyl 100 mcg was administered intravenously. Moderate Sedation Time: 22 minutes. The patient's level of consciousness and vital signs were monitored continuously by radiology nursing throughout the procedure under my direct supervision. CONTRAST:  None FLUOROSCOPY TIME:  18 seconds (7 mGy) COMPLICATIONS: None immediate. PROCEDURE: The procedure, risks, benefits, and alternatives were explained to the patient. Questions regarding the procedure were encouraged and answered. The  patient understands and consents to the procedure. The right neck and chest were prepped with chlorhexidine in a sterile fashion, and a sterile drape was applied covering the operative field. Maximum barrier sterile technique with sterile gowns and gloves were used for the procedure. A timeout was performed prior to the initiation of the procedure. Local anesthesia was provided with 1% lidocaine with epinephrine. After creating a small venotomy incision, a micropuncture kit was utilized to access the internal jugular vein. Real-time ultrasound guidance was utilized for vascular access including the acquisition of a permanent ultrasound image documenting patency of the accessed vessel. The microwire was utilized to measure appropriate catheter length. A subcutaneous port pocket was then created along the upper chest wall utilizing a combination of sharp and blunt dissection. The pocket was irrigated with sterile saline. A single lumen "ISP" sized power injectable port was chosen for placement. The 8 Fr catheter was tunneled from the port pocket site to the venotomy incision. The port was placed in the pocket. The external catheter was trimmed to appropriate length. At the venotomy, an 8 Fr peel-away sheath was placed over a guidewire under fluoroscopic guidance. The catheter was then placed through the sheath and the sheath was removed. Final catheter positioning was confirmed and documented with a fluoroscopic spot radiograph. The port was accessed with a Huber needle, aspirated and flushed with heparinized saline. The venotomy site was closed with an interrupted 4-0 Vicryl suture. The port pocket incision was closed with interrupted 2-0 Vicryl suture and the skin was opposed with a running subcuticular 4-0 Vicryl suture. Dermabond and Steri-strips were applied to both incisions. Dressings were placed. The patient tolerated the procedure well without immediate post procedural complication. FINDINGS: After catheter  placement, the tip lies within the superior cavoatrial junction. The catheter aspirates and flushes normally and is ready for immediate use. IMPRESSION: Successful placement of a right internal jugular approach power injectable Port-A-Cath. The catheter is ready for immediate use. Electronically Signed   By: Sandi Mariscal M.D.   On: 10/23/2019 10:23   VAS [name] LOWER EXTREMITY VENOUS (DVT)  Result Date: 11/09/2019  Lower Venous DVTStudy Indications: Pain.  Risk Factors: Cancer Metastatic lung cancer. Comparison Study: No prior study on file Performing Technologist: Sharion Dove RVS  Examination Guidelines: A complete evaluation includes B-mode imaging, spectral Doppler, color Doppler, and power Doppler as needed of all accessible portions of each vessel. Bilateral testing is considered an integral part of a complete examination. Limited examinations for reoccurring indications may be performed as noted. The reflux portion of the exam is performed with the patient in reverse Trendelenburg.  +-------+---------------+---------+-----------+----------+--------------+ RIGHT  CompressibilityPhasicitySpontaneityPropertiesThrombus Aging +-------+---------------+---------+-----------+----------+--------------+ CFV    Full           Yes      Yes                                 +-------+---------------+---------+-----------+----------+--------------+ SFJ    Full                                                        +-------+---------------+---------+-----------+----------+--------------+  FV ProxFull                                                        +-------+---------------+---------+-----------+----------+--------------+   +---------+---------------+---------+-----------+----------+--------------+ LEFT     CompressibilityPhasicitySpontaneityPropertiesThrombus Aging +---------+---------------+---------+-----------+----------+--------------+ CFV      Full           Yes      Yes                                  +---------+---------------+---------+-----------+----------+--------------+ SFJ      Full                                                        +---------+---------------+---------+-----------+----------+--------------+ FV Prox  Full                                                        +---------+---------------+---------+-----------+----------+--------------+ FV Mid   Full                                                        +---------+---------------+---------+-----------+----------+--------------+ FV DistalFull                                                        +---------+---------------+---------+-----------+----------+--------------+ PFV      Full                                                        +---------+---------------+---------+-----------+----------+--------------+ POP      Partial        No       No                   acute, mobile  +---------+---------------+---------+-----------+----------+--------------+ PTV      None                                         Acute          +---------+---------------+---------+-----------+----------+--------------+ PERO     None                                         Acute          +---------+---------------+---------+-----------+----------+--------------+     Summary: RIGHT: - No evidence  of common femoral vein obstruction.  LEFT: - Findings consistent with acute deep vein thrombosis involving the left popliteal vein, left posterior tibial veins, and left peroneal veins.  *See table(s) above for measurements and observations.    Preliminary         The patient was seen and examined with Dr. Lorenso Courier.

## 2019-11-09 NOTE — Progress Notes (Signed)
Pt seen by PA Lucianne Lei only, no assessment by [facility] RN at this time.  PA aware.

## 2019-11-09 NOTE — Patient Instructions (Signed)
Deep Vein Thrombosis  Deep vein thrombosis (DVT) is a condition in which a blood clot forms in a deep vein, such as a lower leg, thigh, or arm vein. A clot is blood that has thickened into a gel or solid. This condition is dangerous. It can lead to serious and even life-threatening complications if the clot travels to the lungs and causes a blockage (pulmonary embolism). It can also damage veins in the leg. This can result in leg pain, swelling, discoloration, and sores (post-thrombotic syndrome). What are the causes? This condition may be caused by:  A slowdown of blood flow.  Damage to a vein.  A condition that causes blood to clot more easily, such as an inherited clotting disorder. What increases the risk? The following factors may make you more likely to develop this condition:  Being overweight.  Being older, especially over age 60.  Sitting or lying down for more than four hours.  Being in the hospital.  Lack of physical activity (sedentary lifestyle).  Pregnancy, being in childbirth, or having recently given birth.  Taking medicines that contain estrogen, such as medicines to prevent pregnancy.  Smoking.  A history of any of the following: ? Blood clots or a blood clotting disease. ? Peripheral vascular disease. ? Inflammatory bowel disease. ? Cancer. ? Heart disease. ? Genetic conditions that affect how your blood clots, such as Factor V Leiden mutation. ? Neurological diseases that affect your legs (leg paresis). ? A recent injury, such as a car accident. ? Major or lengthy surgery. ? A central line placed inside a large vein. What are the signs or symptoms? Symptoms of this condition include:  Swelling, pain, or tenderness in an arm or leg.  Warmth, redness, or discoloration in an arm or leg. If the clot is in your leg, symptoms may be more noticeable or worse when you stand or walk. Some people may not develop any symptoms. How is this diagnosed? This  condition is diagnosed with:  A medical history and physical exam.  Tests, such as: ? Blood tests. These are done to check how well your blood clots. ? Ultrasound. This is done to check for clots. ? Venogram. For this test, contrast dye is injected into a vein and X-rays are taken to check for any clots. How is this treated? Treatment for this condition depends on:  The cause of your DVT.  Your risk for bleeding or developing more clots.  Any other medical conditions that you have. Treatment may include:  Taking a blood thinner (anticoagulant). This type of medicine prevents clots from forming. It may be taken by mouth, injected under the skin, or injected through an IV (catheter).  Injecting clot-dissolving medicines into the affected vein (catheter-directed thrombolysis).  Having surgery. Surgery may be done to: ? Remove the clot. ? Place a filter in a large vein to catch blood clots before they reach the lungs. Some treatments may be continued for up to six months. Follow these instructions at home: If you are taking blood thinners:  Take the medicine exactly as told by your health care provider. Some blood thinners need to be taken at the same time every day. Do not skip a dose.  Talk with your health care provider before you take any medicines that contain aspirin or NSAIDs. These medicines increase your risk for dangerous bleeding.  Ask your health care provider about foods and drugs that could change the way the medicine works (may interact). Avoid those things if your   health care provider tells you to do so.  Blood thinners can cause easy bruising and may make it difficult to stop bleeding. Because of this: ? Be very careful when using knives, scissors, or other sharp objects. ? Use an electric razor instead of a blade. ? Avoid activities that could cause injury or bruising, and follow instructions about how to prevent falls.  Wear a medical alert bracelet or carry a  card that lists what medicines you take. General instructions  Take over-the-counter and prescription medicines only as told by your health care provider.  Return to your normal activities as told by your health care provider. Ask your health care provider what activities are safe for you.  Wear compression stockings if recommended by your health care provider.  Keep all follow-up visits as told by your health care provider. This is important. How is this prevented? To lower your risk of developing this condition again:  For 30 or more minutes every day, do an activity that: ? Involves moving your arms and legs. ? Increases your heart rate.  When traveling for longer than four hours: ? Exercise your arms and legs every hour. ? Drink plenty of water. ? Avoid drinking alcohol.  Avoid sitting or lying for a long time without moving your legs.  If you have surgery or you are hospitalized, ask about ways to prevent blood clots. These may include taking frequent walks or using anticoagulants.  Stay at a healthy weight.  If you are a woman who is older than age 35, avoid unnecessary use of medicines that contain estrogen, such as some birth control pills.  Do not use any products that contain nicotine or tobacco, such as cigarettes and e-cigarettes. This is especially important if you take estrogen medicines. If you need help quitting, ask your health care provider. Contact a health care provider if:  You miss a dose of your blood thinner.  Your menstrual period is heavier than usual.  You have unusual bruising. Get help right away if:  You have: ? New or increased pain, swelling, or redness in an arm or leg. ? Numbness or tingling in an arm or leg. ? Shortness of breath. ? Chest pain. ? A rapid or irregular heartbeat. ? A severe headache or confusion. ? A cut that will not stop bleeding.  There is blood in your vomit, stool, or urine.  You have a serious fall or accident,  or you hit your head.  You feel light-headed or dizzy.  You cough up blood. These symptoms may represent a serious problem that is an emergency. Do not wait to see if the symptoms will go away. Get medical help right away. Call your local emergency services (911 in the [place] Do not drive yourself to the hospital. Summary  Deep vein thrombosis (DVT) is a condition in which a blood clot forms in a deep vein, such as a lower leg, thigh, or arm vein.  Symptoms can include swelling, warmth, pain, and redness in your leg or arm.  This condition may be treated with a blood thinner (anticoagulant medicine), medicine that is injected to dissolve blood clots,compression stockings, or surgery.  If you are prescribed blood thinners, take them exactly as told. This information is not intended to replace advice given to you by your health care provider. Make sure you discuss any questions you have with your health care provider. Document Revised: 08/09/2017 Document Reviewed: 01/25/2017 [employer] Patient Education  2020 [employer]  

## 2019-11-10 ENCOUNTER — Ambulatory Visit
Admission: RE | Admit: 2019-11-10 | Discharge: 2019-11-10 | Disposition: A | Discharge status: Home / Self Care | Payer: BC Managed Care – PPO | Source: Ambulatory Visit | Attending: Radiation Oncology | Admitting: Radiation Oncology

## 2019-11-10 ENCOUNTER — Other Ambulatory Visit: Payer: Self-pay

## 2019-11-10 ENCOUNTER — Ambulatory Visit: Payer: BC Managed Care – PPO

## 2019-11-10 DIAGNOSIS — C3490 Malignant neoplasm of unspecified part of unspecified bronchus or lung: Secondary | ICD-10-CM | POA: Diagnosis not present

## 2019-11-10 DIAGNOSIS — C7951 Secondary malignant neoplasm of bone: Secondary | ICD-10-CM | POA: Diagnosis not present

## 2019-11-10 DIAGNOSIS — C7931 Secondary malignant neoplasm of brain: Secondary | ICD-10-CM | POA: Diagnosis not present

## 2019-11-10 DIAGNOSIS — Z510 Encounter for antineoplastic radiation therapy: Secondary | ICD-10-CM | POA: Diagnosis not present

## 2019-11-11 ENCOUNTER — Encounter: Payer: Self-pay | Admitting: Radiation Oncology

## 2019-11-11 ENCOUNTER — Other Ambulatory Visit: Payer: Self-pay

## 2019-11-11 ENCOUNTER — Ambulatory Visit
Admission: RE | Admit: 2019-11-11 | Discharge: 2019-11-11 | Disposition: A | Discharge status: Home / Self Care | Payer: BC Managed Care – PPO | Source: Ambulatory Visit | Attending: Radiation Oncology | Admitting: Radiation Oncology

## 2019-11-11 DIAGNOSIS — Z510 Encounter for antineoplastic radiation therapy: Secondary | ICD-10-CM | POA: Diagnosis not present

## 2019-11-11 DIAGNOSIS — C3490 Malignant neoplasm of unspecified part of unspecified bronchus or lung: Secondary | ICD-10-CM | POA: Diagnosis not present

## 2019-11-11 DIAGNOSIS — C7951 Secondary malignant neoplasm of bone: Secondary | ICD-10-CM | POA: Diagnosis not present

## 2019-11-11 DIAGNOSIS — C7931 Secondary malignant neoplasm of brain: Secondary | ICD-10-CM | POA: Diagnosis not present

## 2019-11-12 ENCOUNTER — Ambulatory Visit: Payer: BC Managed Care – PPO | Attending: Internal Medicine

## 2019-11-12 ENCOUNTER — Ambulatory Visit: Payer: BC Managed Care – PPO

## 2019-11-13 ENCOUNTER — Ambulatory Visit: Payer: BC Managed Care – PPO

## 2019-11-16 ENCOUNTER — Telehealth: Payer: Self-pay | Admitting: *Deleted

## 2019-11-16 ENCOUNTER — Ambulatory Visit: Payer: BC Managed Care – PPO | Admitting: Hematology and Oncology

## 2019-11-16 ENCOUNTER — Other Ambulatory Visit: Payer: BC Managed Care – PPO

## 2019-11-16 ENCOUNTER — Ambulatory Visit: Payer: BC Managed Care – PPO

## 2019-11-16 ENCOUNTER — Other Ambulatory Visit: Payer: Self-pay | Admitting: *Deleted

## 2019-11-16 MED ORDER — LIDOCAINE VISCOUS HCL 2 % MT SOLN: 15.0000 mL | OROMUCOSAL | 1 refills | Status: DC | PRN

## 2019-11-16 MED FILL — LIDOCAINE 2% VISCOUS SOLN: 2 | 3 days supply | Qty: 100 | Fill #0

## 2019-11-16 NOTE — Telephone Encounter (Signed)
Received vm message from pt's husband inquiring about refill on her Tabrecta.   TCT [name] and spoke with him. Advised that there was 1 refill available on this prescription @ [facility]. Advised him to call ahead to confer availability.  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 She has been using Carafate 1 gm QID but says it is not helping much.  Reviewed how [name] has been giving it to her. He is breaking up the tablet leaving larger pieces. Advised to crush the tablet, making it easier to give and hopefully coat her esophagus better. Will also call in Viscous lidocaine to use every 4 hours as needed, reminding him that this will numb her mouth as well and to be extra careful when swallowing. Pt able to eat cold thing like ice cream as it feels-Better than room temperature food and liquids  She tells [name] that even water is painful to drink. [name] voiced understanding to the above directions/information. [name] is aware of upcoming appt on 11/18/19

## 2019-11-17 ENCOUNTER — Ambulatory Visit: Payer: BC Managed Care – PPO

## 2019-11-18 ENCOUNTER — Telehealth: Payer: Self-pay

## 2019-11-18 ENCOUNTER — Inpatient Hospital Stay: Payer: BC Managed Care – PPO

## 2019-11-18 ENCOUNTER — Other Ambulatory Visit: Payer: Self-pay | Admitting: Hematology and Oncology

## 2019-11-18 ENCOUNTER — Other Ambulatory Visit: Payer: Self-pay | Admitting: *Deleted

## 2019-11-18 ENCOUNTER — Ambulatory Visit: Payer: BC Managed Care – PPO

## 2019-11-18 ENCOUNTER — Encounter: Payer: Self-pay | Admitting: Hematology and Oncology

## 2019-11-18 ENCOUNTER — Inpatient Hospital Stay (HOSPITAL_BASED_OUTPATIENT_CLINIC_OR_DEPARTMENT_OTHER): Payer: BC Managed Care – PPO | Admitting: Hematology and Oncology

## 2019-11-18 ENCOUNTER — Other Ambulatory Visit: Payer: Self-pay

## 2019-11-18 VITALS — BP 90/68 | HR 101 | Temp 97.8°F | Resp 18 | Ht 62.0 in | Wt 141.2 lb

## 2019-11-18 DIAGNOSIS — I10 Essential (primary) hypertension: Secondary | ICD-10-CM | POA: Diagnosis not present

## 2019-11-18 DIAGNOSIS — R110 Nausea: Secondary | ICD-10-CM | POA: Diagnosis not present

## 2019-11-18 DIAGNOSIS — Z95828 Presence of other vascular implants and grafts: Secondary | ICD-10-CM | POA: Diagnosis not present

## 2019-11-18 DIAGNOSIS — C3491 Malignant neoplasm of unspecified part of right bronchus or lung: Secondary | ICD-10-CM

## 2019-11-18 DIAGNOSIS — Z86718 Personal history of other venous thrombosis and embolism: Secondary | ICD-10-CM | POA: Diagnosis not present

## 2019-11-18 DIAGNOSIS — I82452 Acute embolism and thrombosis of left peroneal vein: Secondary | ICD-10-CM | POA: Diagnosis not present

## 2019-11-18 DIAGNOSIS — C7931 Secondary malignant neoplasm of brain: Secondary | ICD-10-CM | POA: Diagnosis not present

## 2019-11-18 DIAGNOSIS — Z9221 Personal history of antineoplastic chemotherapy: Secondary | ICD-10-CM | POA: Diagnosis not present

## 2019-11-18 DIAGNOSIS — Z7901 Long term (current) use of anticoagulants: Secondary | ICD-10-CM | POA: Diagnosis not present

## 2019-11-18 DIAGNOSIS — Z87891 Personal history of nicotine dependence: Secondary | ICD-10-CM | POA: Diagnosis not present

## 2019-11-18 DIAGNOSIS — Z923 Personal history of irradiation: Secondary | ICD-10-CM | POA: Diagnosis not present

## 2019-11-18 DIAGNOSIS — Z79899 Other long term (current) drug therapy: Secondary | ICD-10-CM | POA: Diagnosis not present

## 2019-11-18 DIAGNOSIS — C7951 Secondary malignant neoplasm of bone: Secondary | ICD-10-CM | POA: Diagnosis not present

## 2019-11-18 DIAGNOSIS — E876 Hypokalemia: Secondary | ICD-10-CM | POA: Diagnosis not present

## 2019-11-18 DIAGNOSIS — Z9049 Acquired absence of other specified parts of digestive tract: Secondary | ICD-10-CM | POA: Diagnosis not present

## 2019-11-18 DIAGNOSIS — M549 Dorsalgia, unspecified: Secondary | ICD-10-CM | POA: Diagnosis not present

## 2019-11-18 DIAGNOSIS — C770 Secondary and unspecified malignant neoplasm of lymph nodes of head, face and neck: Secondary | ICD-10-CM | POA: Diagnosis not present

## 2019-11-18 DIAGNOSIS — C3431 Malignant neoplasm of lower lobe, right bronchus or lung: Secondary | ICD-10-CM | POA: Diagnosis not present

## 2019-11-18 LAB — CBC WITH DIFFERENTIAL (CANCER CENTER ONLY)
Abs Immature Granulocytes: 0.07 10*3/uL (ref 0.00–0.07)
Basophils Absolute: 0 10*3/uL (ref 0.0–0.1)
Basophils Relative: 0 %
Eosinophils Absolute: 0.1 10*3/uL (ref 0.0–0.5)
Eosinophils Relative: 1 %
HCT: 37.2 % (ref 36.0–46.0)
Hemoglobin: 12.6 g/dL (ref 12.0–15.0)
Immature Granulocytes: 1 %
Lymphocytes Relative: 10 %
Lymphs Abs: 1 10*3/uL (ref 0.7–4.0)
MCH: 30.7 pg (ref 26.0–34.0)
MCHC: 33.9 g/dL (ref 30.0–36.0)
MCV: 90.5 fL (ref 80.0–100.0)
Monocytes Absolute: 0.9 10*3/uL (ref 0.1–1.0)
Monocytes Relative: 10 %
Neutro Abs: 7.7 10*3/uL (ref 1.7–7.7)
Neutrophils Relative %: 78 %
Platelet Count: 277 10*3/uL (ref 150–400)
RBC: 4.11 MIL/uL (ref 3.87–5.11)
RDW: 13 % (ref 11.5–15.5)
WBC Count: 9.8 10*3/uL (ref 4.0–10.5)
nRBC: 0 % (ref 0.0–0.2)

## 2019-11-18 LAB — CMP (CANCER CENTER ONLY)
ALT: 39 U/L (ref 0–44)
AST: 13 U/L — ABNORMAL LOW (ref 15–41)
Albumin: 2.7 g/dL — ABNORMAL LOW (ref 3.5–5.0)
Alkaline Phosphatase: 180 U/L — ABNORMAL HIGH (ref 38–126)
Anion gap: 11 (ref 5–15)
BUN: 12 mg/dL (ref 8–23)
CO2: 26 mmol/L (ref 22–32)
Calcium: 8.2 mg/dL — ABNORMAL LOW (ref 8.9–10.3)
Chloride: 102 mmol/L (ref 98–111)
Creatinine: 0.69 mg/dL (ref 0.44–1.00)
GFR, Est AFR Am: >60 mL/min (ref >60)
GFR, Estimated: >60 mL/min (ref >60)
Glucose, Bld: 160 mg/dL — ABNORMAL HIGH (ref 70–99)
Potassium: 2.5 mmol/L — CL (ref 3.5–5.1)
Sodium: 139 mmol/L (ref 135–145)
Total Bilirubin: 0.5 mg/dL (ref 0.3–1.2)
Total Protein: 5.5 g/dL — ABNORMAL LOW (ref 6.5–8.1)

## 2019-11-18 MED ORDER — SODIUM CHLORIDE 0.9% FLUSH
10.0000 mL | Freq: Once | INTRAVENOUS | Status: DC
  Filled 2019-11-18: qty 10

## 2019-11-18 MED ORDER — HEPARIN SOD (PORK) LOCK FLUSH 100 UNIT/ML IV SOLN
500.0000 [IU] | Freq: Once | INTRAVENOUS | Status: AC
  Administered 2019-11-18: 500 [IU] via INTRAVENOUS
  Filled 2019-11-18: qty 5

## 2019-11-18 MED ORDER — POTASSIUM CHLORIDE 20 MEQ/15ML (10%) PO SOLN: 20.0000 meq | Freq: Two times a day (BID) | ORAL | 0 refills | Status: DC

## 2019-11-18 MED ORDER — POTASSIUM CHLORIDE 20 MEQ PO PACK: 20.0000 meq | PACK | Freq: Two times a day (BID) | ORAL | 0 refills | Status: DC

## 2019-11-18 MED ORDER — SODIUM CHLORIDE 0.9% FLUSH
10.0000 mL | Freq: Once | INTRAVENOUS | Status: AC
  Administered 2019-11-18: 10 mL via INTRAVENOUS
  Filled 2019-11-18: qty 10

## 2019-11-18 MED ORDER — HEPARIN SOD (PORK) LOCK FLUSH 100 UNIT/ML IV SOLN
500.0000 [IU] | Freq: Once | INTRAVENOUS | Status: DC
  Filled 2019-11-18: qty 5

## 2019-11-18 MED FILL — POTASSIUM CHLORIDE 20 MEQ/1: 20 MEQ/15ML | 15 days supply | Qty: 473 | Fill #0

## 2019-11-18 MED FILL — TABRECTA 200 MG TABS: 200 | 14 days supply | Qty: 56 | Fill #1

## 2019-11-18 NOTE — Progress Notes (Signed)
[place] Telephone:(336) [phone]   [phone]) [phone]  PROGRESS NOTE  Patient Care Team: Elby Showers, MD as PCP - General (Internal Medicine) Valrie Hart, RN as Oncology Nurse Navigator Acquanetta Chain, DO as Consulting Physician [facility] and Palliative Medicine)  Hematological/Oncological History # Metastatic Adenocarcinoma of the Lung 1) 09/29/2019: patient presented to the ED after fall down stairs. CT of the head showed showed concerning for metastatic disease involving the left cerebellum and right occipital lobe.  2) 09/30/2019: MRI brain confirms mass within the left cerebellum measures 1.6 x 1.3 x 1.5 cm as well as at least 8 metastatic lesions within the supratentorial and infratentorial brain as outlined 3) 10/01/2019: PET CT scan revealed hypermetabolic infrahilar right lower lobe mass with hypermetabolic nodal metastases in the right hilum, mediastinum, right supraclavicular region, left axilla and lower left neck. 4) 10/08/2019: US guided biopsy of the lymph nodes confirms poorly differentiated non-small cell carcinoma. Immunohistochemistry confirms adenocarcinoma of lung primary. PD-L1 and NGS later revealed a MET Exon 14 mutation and TPS of 90%.  5) 10/12/2019: Establish care with Dr. Lorenso Courier  6)11/04/2019: Day 1 of capmatinib 467m BID 7) 11/09/2019: presented to Rad/Onc with a DVT in LLE. Seen in symptom management clinic and started on apixaban.   Interval History:  [place]ia [id number].[id number]. female with medical history significant for metastatic adenocarcinoma of the lung presents for a follow up visit. The patient's last visit was on 10/28/2019 at which time she was consented for capmatinib therapy. In the interim since the last visit she developed a LLE DVT and was started on apixaban therapy on 11/09/2019.   On exam today Virginia [name] notes that she feels markedly improved from the last time I saw her on 11/09/2019.  She notes that the pain in her leg from  the DVT resolved within a few days of starting apixaban therapy.  She notes that she has not had any issues with apixaban therapy and denies having any nosebleeds, bruising, or dark stools.  Additionally her pain has been under good control with the oxycodone 5 mg every 4 hours as needed.  Her bowels are moving better now that she is taking senna docusate and MiraLAX.  The major issue that she has been reporting is trouble with swallowing.  She notes it has been painful to swallow and that she has been taking soccer fate which has improved this modestly.  She also notes that the oxycodone has been helping with the pain in swallowing.  On further review the patient notes that she had some nausea and fatigue the Friday after she started taking capmatinib, but has otherwise tolerated the medication quite well without any nausea, vomiting, diarrhea, fevers, chills, sweats.  Her weight has been stable and her appetite has been good, limited mostly by the swallowing issue.  On exam today she is slightly hypotensive and tachycardic though she endorses that her p.o. intake of water has been poor due esophageal pain.  Otherwise she had no other questions or concerns today.  A full 10 point ROS is listed below.  MEDICAL HISTORY:  Past Medical History:  Diagnosis Date  . Anxiety   . Cancer ([place]   . Dyspnea   . GERD (gastroesophageal reflux disease)   . Hypercholesterolemia    per pt, she does not have elevated lipids  . Hypertension   . Tobacco abuse     SURGICAL HISTORY: Past Surgical History:  Procedure Laterality Date  . ABDOMINAL HYSTERECTOMY  partial/ left ovaries  . CHOLECYSTECTOMY    . DILATION AND CURETTAGE OF UTERUS    . IR IMAGING GUIDED PORT INSERTION  10/23/2019  . LIPOMA EXCISION  2018   removed under left breast and right thigh.  . TUBAL LIGATION      SOCIAL HISTORY: Social History   Socioeconomic History  . Marital status: Married    Spouse name: Not on file  . Number of  children: 3  . Years of education: Not on file  . Highest education level: Not on file  Occupational History  . Occupation: owns Medical illustrator: [employer]  Tobacco Use  . Smoking status: Former Smoker    Packs/day: 0.25    Quit date: 10/31/2012    Years since quitting: 7.0  . Smokeless tobacco: Never Used  Substance and Sexual Activity  . Alcohol use: Yes    Alcohol/week: 0.0 standard drinks    Comment: rare  . Drug use: No  . Sexual activity: Yes  Other Topics Concern  . Not on file  Social History Narrative  . Not on file   Social Determinants of Health   Financial Resource Strain:   . Difficulty of Paying Living Expenses: Not on file  Food Insecurity:   . Worried About Charity fundraiser in the Last Year: Not on file  . Ran Out of Food in the Last Year: Not on file  Transportation Needs:   . Lack of Transportation (Medical): Not on file  . Lack of Transportation (Non-Medical): Not on file  Physical Activity:   . Days of Exercise per Week: Not on file  . Minutes of Exercise per Session: Not on file  Stress:   . Feeling of Stress : Not on file  Social Connections:   . Frequency of Communication with Friends and Family: Not on file  . Frequency of Social Gatherings with Friends and Family: Not on file  . Attends Religious Services: Not on file  . Active Member of Clubs or Organizations: Not on file  . Attends Archivist Meetings: Not on file  . Marital Status: Not on file  Intimate Partner Violence:   . Fear of Current or Ex-Partner: Not on file  . Emotionally Abused: Not on file  . Physically Abused: Not on file  . Sexually Abused: Not on file    FAMILY HISTORY: Family History  Problem Relation Age of Onset  . Heart disease Father   . Drug abuse Daughter   . Drug abuse Son   . Cancer Sister   . Heart disease Brother   . Heart attack Brother     ALLERGIES:  has No Known Allergies.  MEDICATIONS:  Current Outpatient Medications   Medication Sig Dispense Refill  . acetaminophen (TYLENOL) 500 MG tablet Take 500-1,000 mg by mouth every 6 (six) hours as needed (for pain.).    Marland Kitchen albuterol (VENTOLIN HFA) 108 (90 Base) MCG/ACT inhaler Inhale 2 puffs into the lungs every 6 (six) hours as needed for wheezing or shortness of breath. 6.7 g 11  . ALPRAZolam (XANAX) 0.25 MG tablet TAKE 1 TABLET BY MOUTH TWICE DAILY AS NEEDED FOR ANXIETY (Patient not taking: Reported on 11/18/2019) 60 tablet 5  . apixaban (ELIQUIS) 5 MG TABS tablet Take 2 tablets (61m) twice daily for 7 days, then 1 tablet (553m twice daily 60 tablet 0  . APPLE CIDER VINEGAR PO Take 450 mg by mouth daily.    . Marland Kitchenspirin 325 MG EC  tablet Take 325 mg by mouth every 8 (eight) hours as needed for pain.    . Budeson-Glycopyrrol-Formoterol (BREZTRI AEROSPHERE) 160-9-4.8 MCG/ACT AERO Inhale 2 puffs into the lungs 2 (two) times daily.    . capmatinib (TABRECTA) 200 MG tablet Take 2 tablets (400 mg total) by mouth 2 (two) times daily. 56 tablet 1  . cyclobenzaprine (FLEXERIL) 10 MG tablet Take 1 tablet (10 mg total) by mouth 3 (three) times daily as needed for muscle spasms. (Patient not taking: Reported on 10/28/2019) 60 tablet 0  . dexamethasone (DECADRON) 2 MG tablet Take 1 tablet (2 mg total) by mouth 2 (two) times daily. (Patient taking differently: Take 2 mg by mouth daily. ) 60 tablet 2  . ELDERBERRY PO Take 1,250 mg by mouth daily.    . hydrochlorothiazide (HYDRODIURIL) 25 MG tablet TAKE 1 TABLET(25 MG) BY MOUTH DAILY (Patient taking differently: Take 25 mg by mouth daily. ) 90 tablet 0  . hydrocortisone (ANUSOL-HC) 2.5 % rectal cream Place 1 application rectally 2 (two) times daily. 30 g 0  . lidocaine (XYLOCAINE) 2 % solution Use as directed 15 mLs in the mouth or throat every 4 (four) hours as needed for mouth pain. 100 mL 1  . lidocaine-prilocaine (EMLA) cream Apply 1 application topically as needed. Apply to port as needed. 30 g 0  . losartan (COZAAR) 100 MG tablet TAKE  ONE TABLET BY MOUTH DAILY 90 tablet 0  . ondansetron (ZOFRAN) 4 MG tablet Take 1 tablet (4 mg total) by mouth every 8 (eight) hours as needed for nausea or vomiting. 20 tablet 0  . OVER THE COUNTER MEDICATION Apply 1 application topically daily as needed (shoulder pain.). [place] Gel OTC    . oxyCODONE (OXY IR/ROXICODONE) 5 MG immediate release tablet Take 1-2 tablets (5-10 mg total) by mouth every 4 (four) hours as needed for severe pain or breakthrough pain. 180 tablet 0  . pantoprazole (PROTONIX) 40 MG tablet TAKE 1 TABLET(40 MG) BY MOUTH DAILY 90 tablet 3  . polyethylene glycol (MIRALAX / GLYCOLAX) 17 g packet Take 17 g by mouth daily.    . Probiotic Product (EQL PROBIOTIC COLON SUPPORT) CAPS Take 1 capsule by mouth daily.    . rosuvastatin (CRESTOR) 5 MG tablet TAKE 1 TABLET BY MOUTH THREE TIMES WEEKLY AS DIRECTED (Patient taking differently: Take 5 mg by mouth every Monday, Wednesday, and Friday. TAKE 1 TABLET BY MOUTH THREE TIMES WEEKLY AS DIRECTED) 36 tablet 1  . sennosides-docusate sodium (SENOKOT-S) 8.6-50 MG tablet Take 1 tablet by mouth daily. 2 in the morning and 2 at night. Adjust as needed    . sucralfate (CARAFATE) 1 g tablet Dissolve 1 tablet in 10 mL H20 and swallow 30 min prior to meals and bedtime. 50 tablet 3  . traZODone (DESYREL) 50 MG tablet Take 1 tablet (50 mg total) by mouth at bedtime. (Patient not taking: Reported on 11/18/2019) 30 tablet 3  . Vitamin D3 (VITAMIN D) 25 MCG tablet Take 2,000 Units by mouth daily.     No current facility-administered medications for this visit.    REVIEW OF SYSTEMS:   Constitutional: ( - ) fevers, ( - )  chills , ( - ) night sweats Eyes: ( - ) blurriness of vision, ( - ) double vision, ( - ) watery eyes Ears, nose, mouth, throat, and face: ( - ) mucositis, ( - ) sore throat Respiratory: ( + ) cough, ( - ) dyspnea, ( - ) wheezes Cardiovascular: ( - )  palpitation, ( - ) chest discomfort, ( - ) lower extremity swelling Gastrointestinal:  (  - ) nausea, ( - ) heartburn, ( - ) change in bowel habits Skin: ( - ) abnormal skin rashes Lymphatics: ( - ) new lymphadenopathy, ( - ) easy bruising Neurological: ( - ) numbness, ( - ) tingling, ( - ) new weaknesses Behavioral/Psych: ( - ) mood change, ( - ) new changes  All other systems were reviewed with the patient and are negative.  PHYSICAL EXAMINATION: ECOG PERFORMANCE STATUS: 1 - Symptomatic but completely ambulatory  Vitals:   11/18/19 0958  BP: 90/68  Pulse: (!) 101  Resp: 18  Temp: 97.8 F (36.6 C)  SpO2: 99%   Filed Weights   11/18/19 0958  Weight: 141 lb 3.2 oz (64 kg)    GENERAL: well appearing middle aged Caucasian female in NAD  SKIN: skin color, texture, turgor are normal, no rashes or significant lesions EYES: conjunctiva are pink and non-injected, sclera clear LUNGS: clear to auscultation and percussion with normal breathing effort HEART: regular rate & rhythm and no murmurs and no lower extremity edema Musculoskeletal: no cyanosis of digits and no clubbing  PSYCH: alert & oriented x 3, fluent speech NEURO: no focal motor/sensory deficits   LABORATORY DATA:  I have reviewed the data as listed CBC Latest Ref Rng & Units 11/18/2019 10/28/2019 10/24/2019  WBC 4.0 - 10.5 K/uL 9.8 17.5(H) 17.5(H)  Hemoglobin 12.0 - 15.0 g/dL 12.6 12.3 12.8  Hematocrit 36.0 - 46.0 % 37.2 36.7 37.9  Platelets 150 - 400 K/uL 277 359 336    CMP Latest Ref Rng & Units 11/18/2019 10/28/2019 10/24/2019  Glucose 70 - 99 mg/dL 160(H) 129(H) 174(H)  BUN 8 - 23 mg/dL _0 Creatinine 0.44 - 1.00 mg/dL 0.69 0.63 0.43(L)  Sodium 135 - 145 mmol/L 139 139 137  Potassium 3.5 - 5.1 mmol/L 2.5(LL) 3.8 3.7  Chloride 98 - 111 mmol/L 102 102 96(L)  CO2 22 - 32 mmol/L _1 Calcium 8.9 - 10.3 mg/dL 8.2(L) 9.4 9.1  Total Protein 6.5 - 8.1 g/dL 5.5(L) 7.0 7.6  Total Bilirubin 0.3 - 1.2 mg/dL 0.5 <0.2(L) 0.5  Alkaline Phos 38 - 126 U/L 180(H) 127(H) 147(H)  AST 15 - 41 U/L 13(L) 18  57(H)  ALT 0 - 44 U/L 39 53(H) 134(H)     RADIOGRAPHIC STUDIES: I have personally reviewed the radiological images as listed and agreed with the findings in the report: improving brain metastasis.  MR Brain W Wo Contrast  Addendum Date: 11/03/2019   ADDENDUM REPORT: 11/03/2019 04:00 ADDENDUM: A 15th metastasis is identified in the right occipital cortex, 3 mm on 9:72. The radiation oncology service is already aware. Electronically Signed   By: Monte Fantasia M.D.   On: 11/03/2019 04:00   Result Date: 11/03/2019 CLINICAL DATA:  Lung cancer with metastatic disease.  Staging. EXAM: MRI HEAD WITHOUT AND WITH CONTRAST TECHNIQUE: Multiplanar, multiecho pulse sequences of the brain and surrounding structures were obtained without and with intravenous contrast. CONTRAST:  67m GADAVIST GADOBUTROL 1 MMOL/ML IV SOLN COMPARISON:  09/30/2019 FINDINGS: Brain: 14 nodular and ring-enhancing brain masses consistent with metastatic disease. These are labeled on series 9. 1. Left para median deep cerebellum on 9:40, 18 mm 2. Subcentimeter in the lower vermis on 9:35 3. Subcentimeter in the right deep cerebellum on 9:39 4. Subcentimeter in the upper right paramedian vermis on 9:50 5. Subcentimeter in the right occipital lobe on 9:67  6. Subcentimeter in the right parietal cortex on 9:89 7. Subcentimeter in the right occipital cortex on 9:89 8. Subcentimeter along a right parietal sulcus on 9:100 9. Punctate along high right parietal gyrus on 9:130 10. Subcentimeter in the high and posterior right frontal cortex on 9:135 11. Posterior left frontal cortical on 9:119 measuring 11 mm 12. Punctate in the parasagittal left frontal cortex on 9:118 13. Subcentimeter along anterior left frontal cortex on 9:107 14. Punctate at right temporal white matter on 9:48 Associated vasogenic edema is mild-to-moderate. In general vasogenic edema is improved, especially in the deep left cerebellum, although there is mild progression of edema in  the posterior left frontal region associated with the largest metastasis. No incidental infarct, hydrocephalus, or extra-axial collection. Vascular: Normal flow voids and vascular enhancements Skull and upper cervical spine: Negative for marrow lesion Sinuses/Orbits: Negative for mass or inflammation IMPRESSION: 1. Fourteen brain metastases as described above. 2. Improved vasogenic edema except at the largest left frontal lesion where there is increased and moderate edema. Electronically Signed: By: Monte Fantasia M.D. On: 10/29/2019 05:15   MR THORACIC SPINE W WO CONTRAST  Result Date: 10/20/2019 CLINICAL DATA:  Back pain. 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 EXAM: MRI THORACIC AND LUMBAR SPINE WITHOUT AND WITH CONTRAST TECHNIQUE: Multiplanar and multiecho pulse sequences of the thoracic and lumbar spine were obtained without and with intravenous contrast. CONTRAST:  56m GADAVIST GADOBUTROL 1 MMOL/ML IV SOLN COMPARISON:  PET scan dated 10/01/2019 FINDINGS: MRI THORACIC SPINE FINDINGS Alignment:  Physiologic. Vertebrae: There are metastatic lesions involving the 7 and T8 vertebra. Tumor extends into the left side of the spinal canal extending from T6-7 disc space to the T8-9 disc space with a slight mass effect upon the left side of the thecal sac. The tumor extends into the left neural foramina at T6-7, T7-8 and T8-9. Tumor extends into the left pedicle of T8 and into the paraspinal soft tissues to the left of midline. There are small lesions in the right transverse process of T2 and in the right transverse processes of T10 and T11. There is a tiny lesion in the T3 vertebral body on STIR imaging which does not enhance after contrast. There is also a lesion in the superior aspect of L2. Cord: No myelopathy. The tumor in the left side of the spinal canal does not affect the spinal cord. Paraspinal and other soft tissues: There is abnormal enhancement of the  paraspinal soft tissues to the left of midline at T7-8. There is a large mass in the right lower lobe. There is mediastinal adenopathy. Disc levels: There are no disc protrusions or significant disc bulges. No spinal or foraminal stenoses. MRI LUMBAR SPINE FINDINGS Segmentation:  Standard. Alignment:  Physiologic. Vertebrae: There is a 2 cm metastatic lesion in the superior aspect of L2 without a pathologic fracture. There is only slight enhancement of the lesion after contrast administration. There is a 19 mm lesion in the posterior aspect of the right iliac bone adjacent to the sacroiliac joint. Conus medullaris: Extends to the L1-2 level and appears normal. Paraspinal and other soft tissues: Negative. Disc levels: L1-2: The disc is normal. The tumor involves the superior aspect of L2 without a pathologic fracture. No extension of tumor into the spinal canal. L2-3: Normal. L3-4: Tiny broad-based disc bulge with no neural impingement. Slight left facet arthritis. L4-5: Tiny broad-based disc bulge. Moderate bilateral facet arthritis. L5-S1: Soft disc extrusion to the  right of midline extending inferiorly behind the body of L5 on the right slightly compressing the right S1 nerve root sleeve. IMPRESSION: 1. Multiple metastatic lesions in the thoracic and lumbar spine as well as in the right iliac bone. 2. Tumor extends into the left side of the spinal canal and into left neural foramina at T6-7, T7-8 and T8-9 with a slight mass effect upon the thecal sac at those levels. 3. No tumor extension into the spinal canal or neural foramina at the other levels of the thoracic or lumbar spine. 4. Large mass in the right lower lobe with mediastinal adenopathy. Electronically Signed   By: Lorriane Shire M.D.   On: 10/20/2019 16:52   MR Lumbar Spine W Wo Contrast  Result Date: 10/20/2019 CLINICAL DATA:  Back pain. 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 EXAM: MRI THORACIC AND  LUMBAR SPINE WITHOUT AND WITH CONTRAST TECHNIQUE: Multiplanar and multiecho pulse sequences of the thoracic and lumbar spine were obtained without and with intravenous contrast. CONTRAST:  46m GADAVIST GADOBUTROL 1 MMOL/ML IV SOLN COMPARISON:  PET scan dated 10/01/2019 FINDINGS: MRI THORACIC SPINE FINDINGS Alignment:  Physiologic. Vertebrae: There are metastatic lesions involving the 7 and T8 vertebra. Tumor extends into the left side of the spinal canal extending from T6-7 disc space to the T8-9 disc space with a slight mass effect upon the left side of the thecal sac. The tumor extends into the left neural foramina at T6-7, T7-8 and T8-9. Tumor extends into the left pedicle of T8 and into the paraspinal soft tissues to the left of midline. There are small lesions in the right transverse process of T2 and in the right transverse processes of T10 and T11. There is a tiny lesion in the T3 vertebral body on STIR imaging which does not enhance after contrast. There is also a lesion in the superior aspect of L2. Cord: No myelopathy. The tumor in the left side of the spinal canal does not affect the spinal cord. Paraspinal and other soft tissues: There is abnormal enhancement of the paraspinal soft tissues to the left of midline at T7-8. There is a large mass in the right lower lobe. There is mediastinal adenopathy. Disc levels: There are no disc protrusions or significant disc bulges. No spinal or foraminal stenoses. MRI LUMBAR SPINE FINDINGS Segmentation:  Standard. Alignment:  Physiologic. Vertebrae: There is a 2 cm metastatic lesion in the superior aspect of L2 without a pathologic fracture. There is only slight enhancement of the lesion after contrast administration. There is a 19 mm lesion in the posterior aspect of the right iliac bone adjacent to the sacroiliac joint. Conus medullaris: Extends to the L1-2 level and appears normal. Paraspinal and other soft tissues: Negative. Disc levels: L1-2: The disc is normal.  The tumor involves the superior aspect of L2 without a pathologic fracture. No extension of tumor into the spinal canal. L2-3: Normal. L3-4: Tiny broad-based disc bulge with no neural impingement. Slight left facet arthritis. L4-5: Tiny broad-based disc bulge. Moderate bilateral facet arthritis. L5-S1: Soft disc extrusion to the right of midline extending inferiorly behind the body of L5 on the right slightly compressing the right S1 nerve root sleeve. IMPRESSION: 1. Multiple metastatic lesions in the thoracic and lumbar spine as well as in the right iliac bone. 2. Tumor extends into the left side of the spinal canal and into left neural foramina at T6-7, T7-8 and T8-9 with a slight mass effect upon  the thecal sac at those levels. 3. No tumor extension into the spinal canal or neural foramina at the other levels of the thoracic or lumbar spine. 4. Large mass in the right lower lobe with mediastinal adenopathy. Electronically Signed   By: Lorriane Shire M.D.   On: 10/20/2019 16:52   CT ABDOMEN PELVIS W CONTRAST  Result Date: 10/24/2019 CLINICAL DATA:  Abdomen pain with distension history of lung cancer EXAM: CT ABDOMEN AND PELVIS WITH CONTRAST TECHNIQUE: Multidetector CT imaging of the abdomen and pelvis was performed using the standard protocol following bolus administration of intravenous contrast. CONTRAST:  120m OMNIPAQUE IOHEXOL 300 MG/ML  SOLN COMPARISON:  MRI 10/20/2019, PET-CT 10/01/2019, CT abdomen pelvis 01/21/2008 FINDINGS: Lower chest: Right lower lobe partially cavitary lung mass with postobstructive consolidations. Heart size within normal limits. Trace pericardial effusion. Hepatobiliary: No focal liver abnormality is seen. Status post cholecystectomy. No biliary dilatation. Pancreas: Unremarkable. No pancreatic ductal dilatation or surrounding inflammatory changes. Spleen: Normal in size without focal abnormality. Adrenals/Urinary Tract: Adrenal glands are normal. Slight prominence of left renal  collecting system and ureter. Distended urinary bladder. Stomach/Bowel: Stomach is nonenlarged. No dilated small bowel. Moderate to large formed feces at the rectum with moderate feces in the rectosigmoid colon. Liquid stools within the remainder of the colon with slight distension of the cecum. Negative for colon wall thickening. Negative appendix. Vascular/Lymphatic: Extensive aortic atherosclerosis. No aneurysm. No significant adenopathy. Reproductive: Status post hysterectomy. No adnexal masses. Other: No free air or free fluid. Subcutaneous soft tissue nodules are noted. 5 mm subcutaneous nodule to the right of midline slightly inferior to the umbilicus, series 2, image number 58. 7 mm left lateral abdominal wall nodule, series 2, image number 31. 12 mm lobulated nodule overlying the right hip, series 2, image number 88. Musculoskeletal: T8 vertebral metastatic lesion on the left side with some protrusion of mass into the left anterior canal. Known L2 metastatic lesion not well seen on today's CT. Faintly visible left iliac crest metastatic lesion, series 2, image number 58. other skeletal metastatic foci demonstrated on prior PET CT are not well seen by CT. IMPRESSION: 1. Negative for bowel obstruction or bowel wall thickening. Moderate to large retained feces at the rectum with moderate stool in the rectosigmoid colon. Slight colon distension by liquid stools upstream to the rectosigmoid colon. 2. Incompletely visualized right lower lobe lung mass with postobstructive consolidations. 3. Scattered subcutaneous nodules, possible metastatic foci 4. T8 vertebral metastatic lesion and left iliac crest metastatic osseous lesion. Electronically Signed   By: KDonavan FoilM.D.   On: 10/24/2019 20:52   DG Chest Port 1 View  Result Date: 10/24/2019 CLINICAL DATA:  Hypoxia, history of recent port placement EXAM: PORTABLE CHEST 1 VIEW COMPARISON:  09/29/2019 FINDINGS: Cardiac shadow is mildly enlarged but stable. New  right-sided chest wall port is noted with catheter tip at the cavoatrial junction. Right infrahilar density with peripheral consolidation is noted consistent with the known history of lung carcinoma. No bony abnormality is noted. IMPRESSION: No acute abnormality noted. Stable right lower lobe mass lesion with peripheral consolidation. Electronically Signed   By: MInez CatalinaM.D.   On: 10/24/2019 19:11   IR IMAGING GUIDED PORT INSERTION  Result Date: 10/23/2019 INDICATION: History of metastatic lung cancer. In need of durable intravenous access for the initiation of chemotherapy. EXAM: IMPLANTED PORT A CATH PLACEMENT WITH ULTRASOUND AND FLUOROSCOPIC GUIDANCE COMPARISON:  PET-CT-10/01/2019 MEDICATIONS: Ancef 2 gm IV; The antibiotic was administered within an appropriate time interval  prior to skin puncture. ANESTHESIA/SEDATION: Moderate (conscious) sedation was employed during this procedure. A total of Versed 2 mg and Fentanyl 100 mcg was administered intravenously. Moderate Sedation Time: 22 minutes. The patient's level of consciousness and vital signs were monitored continuously by radiology nursing throughout the procedure under my direct supervision. CONTRAST:  None FLUOROSCOPY TIME:  18 seconds (7 mGy) COMPLICATIONS: None immediate. PROCEDURE: The procedure, risks, benefits, and alternatives were explained to the patient. Questions regarding the procedure were encouraged and answered. The patient understands and consents to the procedure. The right neck and chest were prepped with chlorhexidine in a sterile fashion, and a sterile drape was applied covering the operative field. Maximum barrier sterile technique with sterile gowns and gloves were used for the procedure. A timeout was performed prior to the initiation of the procedure. Local anesthesia was provided with 1% lidocaine with epinephrine. After creating a small venotomy incision, a micropuncture kit was utilized to access the internal jugular vein.  Real-time ultrasound guidance was utilized for vascular access including the acquisition of a permanent ultrasound image documenting patency of the accessed vessel. The microwire was utilized to measure appropriate catheter length. A subcutaneous port pocket was then created along the upper chest wall utilizing a combination of sharp and blunt dissection. The pocket was irrigated with sterile saline. A single lumen "ISP" sized power injectable port was chosen for placement. The 8 Fr catheter was tunneled from the port pocket site to the venotomy incision. The port was placed in the pocket. The external catheter was trimmed to appropriate length. At the venotomy, an 8 Fr peel-away sheath was placed over a guidewire under fluoroscopic guidance. The catheter was then placed through the sheath and the sheath was removed. Final catheter positioning was confirmed and documented with a fluoroscopic spot radiograph. The port was accessed with a Huber needle, aspirated and flushed with heparinized saline. The venotomy site was closed with an interrupted 4-0 Vicryl suture. The port pocket incision was closed with interrupted 2-0 Vicryl suture and the skin was opposed with a running subcuticular 4-0 Vicryl suture. Dermabond and Steri-strips were applied to both incisions. Dressings were placed. The patient tolerated the procedure well without immediate post procedural complication. FINDINGS: After catheter placement, the tip lies within the superior cavoatrial junction. The catheter aspirates and flushes normally and is ready for immediate use. IMPRESSION: Successful placement of a right internal jugular approach power injectable Port-A-Cath. The catheter is ready for immediate use. Electronically Signed   By: Sandi Mariscal M.D.   On: 10/23/2019 10:23   VAS [name] LOWER EXTREMITY VENOUS (DVT)  Result Date: 11/09/2019  Lower Venous DVTStudy Indications: Pain.  Risk Factors: Cancer Metastatic lung cancer. Comparison Study: No prior  study on file Performing Technologist: Sharion Dove RVS  Examination Guidelines: A complete evaluation includes B-mode imaging, spectral Doppler, color Doppler, and power Doppler as needed of all accessible portions of each vessel. Bilateral testing is considered an integral part of a complete examination. Limited examinations for reoccurring indications may be performed as noted. The reflux portion of the exam is performed with the patient in reverse Trendelenburg.  +-------+---------------+---------+-----------+----------+--------------+ RIGHT  CompressibilityPhasicitySpontaneityPropertiesThrombus Aging +-------+---------------+---------+-----------+----------+--------------+ CFV    Full           Yes      Yes                                 +-------+---------------+---------+-----------+----------+--------------+ SFJ  Full                                                        +-------+---------------+---------+-----------+----------+--------------+ FV ProxFull                                                        +-------+---------------+---------+-----------+----------+--------------+   +---------+---------------+---------+-----------+----------+--------------+ LEFT     CompressibilityPhasicitySpontaneityPropertiesThrombus Aging +---------+---------------+---------+-----------+----------+--------------+ CFV      Full           Yes      Yes                                 +---------+---------------+---------+-----------+----------+--------------+ SFJ      Full                                                        +---------+---------------+---------+-----------+----------+--------------+ FV Prox  Full                                                        +---------+---------------+---------+-----------+----------+--------------+ FV Mid   Full                                                         +---------+---------------+---------+-----------+----------+--------------+ FV DistalFull                                                        +---------+---------------+---------+-----------+----------+--------------+ PFV      Full                                                        +---------+---------------+---------+-----------+----------+--------------+ POP      Partial        No       No                   acute, mobile  +---------+---------------+---------+-----------+----------+--------------+ PTV      None                                         Acute          +---------+---------------+---------+-----------+----------+--------------+ PERO     None  Acute          +---------+---------------+---------+-----------+----------+--------------+     Summary: RIGHT: - No evidence of common femoral vein obstruction.  LEFT: - Findings consistent with acute deep vein thrombosis involving the left popliteal vein, left posterior tibial veins, and left peroneal veins.  *See table(s) above for measurements and observations. Electronically signed by Ruta Hinds MD on 11/09/2019 at 5:55:27 PM.    Final     ASSESSMENT & PLAN Virginia [name] 63 y.o. female with medical history significant for metastatic adenocarcinoma of the lung presents for a follow up visit. 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   Today Mrs. Roderick appears much improved from her prior visit.  She has been taking the apixaban therapy for lower extremity DVT without any signs or symptoms of bleeding.  Additionally she has been tolerating capmatinib without any difficulties.  She has completed radiation therapy and is otherwise well controlled with her pain medication.  Her weight is stable and appetite is good.  Previously we discussed capmatinib and the expected side effect from this medication. Capmatinib can cause peripheral  edema, diarrhea, increased liver enzymes, drop in blood counts, fatigue, increase in serum creatinine, and rarely acute pancreatitis or acute renal failure. We also discussed the efficacy of the medication by discussing the phase II multicenter multicohort GEOMETRY mono-1 trial, which showed the overall response rate was 68% (95% confidence interval [CI] = 48%--84%), with a complete response in 4%, and the median response duration was 12.6 months (95% CI = 5.5-25.3 months). We also noted that chemotherapy/immunotherapy can be administered after progression or intolerance of capmatinib. The study showed this medication was most effective when used in the first line setting (Among previously treated patients, the overall response rate (all partial responses) was 41% (95% CI = 29%-53%), and the median response duration was 9.7 months (95% CI = 5.5-13.0 months).   # Metastatic Adenocarcinoma of the Lung --given the MET Exon 14 mutation the patient is eligible for targeted therapy. Started on capmatinib 483m BID on 11/04/2019.  --hold plans to start Carbo/Pem/Pem. This can be started if patient has progression or intolerance on oral therapy.  --patient will be followed initially every 2 weeks with labs and clinic visit. These can be extended to monthly if the patient's labs and clinical status are stable --plan for repeat CT C/A/P in late April 2021 (3 months from last scan in Jan 2021) --supportive therapy as listed below.  --RTC on 12/23/2019 after repeat CT C/A/P  #Hypokalemia, acute --likely 2/2 to decreased PO intake, hypovolemia, and BP medications --given her decreased BP would recommend holding losartan and hydrochlorothiazide --will prescribe PO K-dur liquid 2535m BID x 10 days to improve potassium --continue to monitor, lab check in 2 weeks time.   #Pain Control, improving --continue oxycodone 35m60m6H PRN. Can increase dose to 77m22mH if pain is severe --continue to take with Senokot to  prevent opioid induced constipation. Miralax PRN   #Supportive Therapy --will provide patient with Zofran 8mg 28m PRN and compazine 235mg 48mPRN --continue EMLA cream with port --continue to monitor   #Headache #Possible Concussion --appreciate the assistance of Dr. VaslowMickeal Skinnereating her brain metastasis and symptoms. --radiation therapy to the brain completed on 11/16/2019.  --continue to monitor   No orders of the defined types were placed in this encounter.  All questions were answered. The patient knows to call the clinic with any problems, questions or  concerns.  A total of more than 30 minutes were spent on this encounter and over half of that time was spent on counseling and coordination of care as outlined above.   Ledell Peoples, MD Department of Hematology/Oncology [place] [facility] Phone: [phone] Pager: [phone] Email: [email or web address]  11/18/2019 10:28 AM   Literature Support:   Angelique Holm, Han JY, et al: Capmatinib [phone]) in MET ex14-mutated advanced non-small cell lung cancer: Efficacy data from the phase II GEOMETRY mono-1 study. 2019 ASCO Annual Meeting. Abstract 9004. Presented February 10, 2018.  --the overall response rate was 68% (95% confidence interval [CI] = 48%--84%), with a complete response in 4%, and the median response duration was 12.6 months (95% CI = 5.5-25.3 months).

## 2019-11-18 NOTE — Telephone Encounter (Signed)
Received critical lab for patient. Potassium 2.5 Beth RN made aware

## 2019-11-19 ENCOUNTER — Telehealth: Payer: Self-pay | Admitting: Hematology and Oncology

## 2019-11-19 NOTE — Telephone Encounter (Signed)
Scheduled per 3/10 los. Called and spoke with patient. Confirmed appts

## 2019-11-25 ENCOUNTER — Other Ambulatory Visit: Payer: Self-pay | Admitting: Medical

## 2019-11-25 ENCOUNTER — Other Ambulatory Visit: Payer: Self-pay | Admitting: Hematology and Oncology

## 2019-11-25 DIAGNOSIS — I82452 Acute embolism and thrombosis of left peroneal vein: Secondary | ICD-10-CM

## 2019-11-27 ENCOUNTER — Other Ambulatory Visit: Payer: Self-pay | Admitting: *Deleted

## 2019-11-27 DIAGNOSIS — I82452 Acute embolism and thrombosis of left peroneal vein: Secondary | ICD-10-CM

## 2019-11-27 MED ORDER — APIXABAN 5 MG PO TABS: ORAL_TABLET | ORAL | 2 refills | Status: DC

## 2019-11-27 NOTE — Telephone Encounter (Signed)
Received call from pt's husband, [name]. He had several questions:    #1 Can [place]ie drive at this time?  Advised that I would discuss with Dr. Mickeal Skinner on Monday, 11/30/19 and get back to him on this.  #2 She needs refill of her eliquis - done, sent to [place] @  [facility]  #3 She she continue her KCL. Yes, per Dr. Lorenso Courier. We will recheck her labs on Wednesday, 12/02/19  #4 Should patient get the covid Vaccine - yes per Dr. Lorenso Courier.   #5 would she be able to travel to [place] in September if she continues to do well.  Yes, per Dr. Lorenso Courier. She should be at monthly labs at that time. Be sure tickets etc are refundable if she doesn't do as well as we expect.

## 2019-11-27 NOTE — Telephone Encounter (Signed)
Virginia [name] voiced understanding to the above answers to his questions. Advised that I would contact him after [name]'s labs on Wednesday regarding the potassium and the driving request.

## 2019-11-30 ENCOUNTER — Other Ambulatory Visit: Payer: Self-pay | Admitting: Medical

## 2019-11-30 DIAGNOSIS — I82452 Acute embolism and thrombosis of left peroneal vein: Secondary | ICD-10-CM

## 2019-11-30 MED FILL — TABRECTA 200 MG TABS: 200 | 14 days supply | Qty: 56 | Fill #0

## 2019-12-01 ENCOUNTER — Other Ambulatory Visit: Payer: Self-pay | Admitting: *Deleted

## 2019-12-01 DIAGNOSIS — C3491 Malignant neoplasm of unspecified part of right bronchus or lung: Secondary | ICD-10-CM

## 2019-12-02 ENCOUNTER — Other Ambulatory Visit: Payer: Self-pay

## 2019-12-02 ENCOUNTER — Telehealth: Payer: Self-pay | Admitting: *Deleted

## 2019-12-02 ENCOUNTER — Inpatient Hospital Stay: Payer: BC Managed Care – PPO

## 2019-12-02 DIAGNOSIS — C770 Secondary and unspecified malignant neoplasm of lymph nodes of head, face and neck: Secondary | ICD-10-CM | POA: Diagnosis not present

## 2019-12-02 DIAGNOSIS — M549 Dorsalgia, unspecified: Secondary | ICD-10-CM | POA: Diagnosis not present

## 2019-12-02 DIAGNOSIS — C7951 Secondary malignant neoplasm of bone: Secondary | ICD-10-CM | POA: Diagnosis not present

## 2019-12-02 DIAGNOSIS — Z923 Personal history of irradiation: Secondary | ICD-10-CM | POA: Diagnosis not present

## 2019-12-02 DIAGNOSIS — C3491 Malignant neoplasm of unspecified part of right bronchus or lung: Secondary | ICD-10-CM

## 2019-12-02 DIAGNOSIS — Z87891 Personal history of nicotine dependence: Secondary | ICD-10-CM | POA: Diagnosis not present

## 2019-12-02 DIAGNOSIS — R110 Nausea: Secondary | ICD-10-CM | POA: Diagnosis not present

## 2019-12-02 DIAGNOSIS — Z79899 Other long term (current) drug therapy: Secondary | ICD-10-CM | POA: Diagnosis not present

## 2019-12-02 DIAGNOSIS — E876 Hypokalemia: Secondary | ICD-10-CM | POA: Diagnosis not present

## 2019-12-02 DIAGNOSIS — Z86718 Personal history of other venous thrombosis and embolism: Secondary | ICD-10-CM | POA: Diagnosis not present

## 2019-12-02 DIAGNOSIS — I10 Essential (primary) hypertension: Secondary | ICD-10-CM | POA: Diagnosis not present

## 2019-12-02 DIAGNOSIS — C3431 Malignant neoplasm of lower lobe, right bronchus or lung: Secondary | ICD-10-CM | POA: Diagnosis not present

## 2019-12-02 DIAGNOSIS — C7931 Secondary malignant neoplasm of brain: Secondary | ICD-10-CM | POA: Diagnosis not present

## 2019-12-02 DIAGNOSIS — I82452 Acute embolism and thrombosis of left peroneal vein: Secondary | ICD-10-CM | POA: Diagnosis not present

## 2019-12-02 DIAGNOSIS — Z9049 Acquired absence of other specified parts of digestive tract: Secondary | ICD-10-CM | POA: Diagnosis not present

## 2019-12-02 DIAGNOSIS — Z7901 Long term (current) use of anticoagulants: Secondary | ICD-10-CM | POA: Diagnosis not present

## 2019-12-02 DIAGNOSIS — Z9221 Personal history of antineoplastic chemotherapy: Secondary | ICD-10-CM | POA: Diagnosis not present

## 2019-12-02 LAB — CMP (CANCER CENTER ONLY)
ALT: 42 U/L (ref 0–44)
AST: 17 U/L (ref 15–41)
Albumin: 3 g/dL — ABNORMAL LOW (ref 3.5–5.0)
Alkaline Phosphatase: 107 U/L (ref 38–126)
Anion gap: 7 (ref 5–15)
BUN: 9 mg/dL (ref 8–23)
CO2: 25 mmol/L (ref 22–32)
Calcium: 8.7 mg/dL — ABNORMAL LOW (ref 8.9–10.3)
Chloride: 107 mmol/L (ref 98–111)
Creatinine: 0.69 mg/dL (ref 0.44–1.00)
GFR, Est AFR Am: >60 mL/min (ref >60)
GFR, Estimated: >60 mL/min (ref >60)
Glucose, Bld: 100 mg/dL — ABNORMAL HIGH (ref 70–99)
Potassium: 4.1 mmol/L (ref 3.5–5.1)
Sodium: 139 mmol/L (ref 135–145)
Total Bilirubin: 0.4 mg/dL (ref 0.3–1.2)
Total Protein: 5.9 g/dL — ABNORMAL LOW (ref 6.5–8.1)

## 2019-12-02 LAB — CBC WITH DIFFERENTIAL (CANCER CENTER ONLY)
Abs Immature Granulocytes: 0.07 10*3/uL (ref 0.00–0.07)
Basophils Absolute: 0 10*3/uL (ref 0.0–0.1)
Basophils Relative: 0 %
Eosinophils Absolute: 0.1 10*3/uL (ref 0.0–0.5)
Eosinophils Relative: 1 %
HCT: 36.7 % (ref 36.0–46.0)
Hemoglobin: 12.1 g/dL (ref 12.0–15.0)
Immature Granulocytes: 1 %
Lymphocytes Relative: 11 %
Lymphs Abs: 1.1 10*3/uL (ref 0.7–4.0)
MCH: 30 pg (ref 26.0–34.0)
MCHC: 33 g/dL (ref 30.0–36.0)
MCV: 90.8 fL (ref 80.0–100.0)
Monocytes Absolute: 0.9 10*3/uL (ref 0.1–1.0)
Monocytes Relative: 9 %
Neutro Abs: 7.5 10*3/uL (ref 1.7–7.7)
Neutrophils Relative %: 78 %
Platelet Count: 281 10*3/uL (ref 150–400)
RBC: 4.04 MIL/uL (ref 3.87–5.11)
RDW: 14.4 % (ref 11.5–15.5)
WBC Count: 9.6 10*3/uL (ref 4.0–10.5)
nRBC: 0 % (ref 0.0–0.2)

## 2019-12-02 NOTE — Telephone Encounter (Signed)
Received call from pt's husband to check on her lab results from today.  Advised that her K+ is now back to normal @ 4.1.  Per Dr. Lorenso Courier, she does not need to continue her K+ supplement at this time.  [name] also asked again about [name] getting the covid vaccine. Again advised that it is ok for her to get this and Dr. Lorenso Courier advises that she get it.  [name] states he will pass along that information to her.  [name] is also asking if it is ok for her to drive. Advised that I would discuss with Dr. Mickeal Skinner and get back to him on that. He voiced understanding.

## 2019-12-02 NOTE — Progress Notes (Signed)
Ms. Menon presents for follow up of radiation completed 11/11/19 to her T-spine, R Ilium and Brain SRS on 11/06/19. She saw Dr. Lorenso Courier last on 11/18/19 and will see him again on 12/23/19. She reports feeling much better. She is able to ambulate, and is back to working her normal hours at work. She reports a recent minor headache relieved with Tylenol. She continues her decadron taper. She does take 5 mg oxycodone every 4-5 hours.   BP (!) 124/94 (BP Location: Left Arm, Patient Position: Sitting)   Pulse 61   Temp 98.3 F (36.8 C) (Temporal)   Resp 18   Ht 5\' 2"  (1.575 m)   Wt 143 lb 2 oz (64.9 kg)   SpO2 99%   BMI 26.18 kg/m    Wt Readings from Last 3 Encounters:  12/04/19 143 lb 2 oz (64.9 kg)  11/18/19 141 lb 3.2 oz (64 kg)  11/09/19 142 lb 1.6 oz (64.5 kg)

## 2019-12-04 ENCOUNTER — Other Ambulatory Visit: Payer: Self-pay

## 2019-12-04 ENCOUNTER — Encounter: Payer: Self-pay | Admitting: Radiation Oncology

## 2019-12-04 ENCOUNTER — Ambulatory Visit
Admission: RE | Admit: 2019-12-04 | Discharge: 2019-12-04 | Disposition: A | Discharge status: Home / Self Care | Payer: BC Managed Care – PPO | Source: Ambulatory Visit | Attending: Radiation Oncology | Admitting: Radiation Oncology

## 2019-12-04 ENCOUNTER — Ambulatory Visit: Payer: Self-pay | Admitting: Radiation Oncology

## 2019-12-04 DIAGNOSIS — Z79899 Other long term (current) drug therapy: Secondary | ICD-10-CM | POA: Diagnosis not present

## 2019-12-04 DIAGNOSIS — C7931 Secondary malignant neoplasm of brain: Secondary | ICD-10-CM | POA: Insufficient documentation

## 2019-12-04 DIAGNOSIS — Z923 Personal history of irradiation: Secondary | ICD-10-CM | POA: Insufficient documentation

## 2019-12-04 DIAGNOSIS — C3491 Malignant neoplasm of unspecified part of right bronchus or lung: Secondary | ICD-10-CM | POA: Diagnosis not present

## 2019-12-04 DIAGNOSIS — Z7901 Long term (current) use of anticoagulants: Secondary | ICD-10-CM | POA: Insufficient documentation

## 2019-12-04 NOTE — Progress Notes (Signed)
  Radiation Oncology         ([phone]) [phone] ________________________________  Name: Virginia [name] MRN: [id number]  Date: 11/06/2019  DOB: 03/26/1956  Stereotactic Treatment Procedure Note  SPECIAL TREATMENT PROCEDURE  Outpatient    ICD-10-CM   1. Brain metastases ([place])  C79.31     3D TREATMENT PLANNING AND DOSIMETRY:  The patient's radiation plan was reviewed and approved by neurosurgery and radiation oncology prior to treatment.  It showed 3-dimensional radiation distributions overlaid onto the planning CT/MRI image set.  The [facility] for the target structures as well as the organs at risk were reviewed. The documentation of the 3D plan and dosimetry are filed in the radiation oncology EMR.  NARRATIVE:  Virginia [name] was brought to the TrueBeam stereotactic radiation treatment machine and placed supine on the CT couch. The head frame was applied, and the patient was set up for stereotactic radiosurgery.  Neurosurgery was present for the set-up and delivery  SIMULATION VERIFICATION:  In the couch zero-angle position, the patient underwent Exactrac imaging using the Brainlab system with orthogonal KV images.  These were carefully aligned and repeated to confirm treatment position for each of the isocenters.  The Exactrac snap film verification was repeated at each couch angle.  SPECIAL TREATMENT PROCEDURE: [name] received stereotactic radiosurgery to the following targets:  20Gy/1 fx  ExacTrac, 7 VMAT Beams Max dose = 128.5% PTV1 Lt Paramedian Cerebellum 72mm PTV2 Lower Vermis <1cm PTV3 Rt Cerebellum <1cm PTV4 Upper Rt Paramedian Vermis <1cm PTV5 Rt Occipital <1cm PTV6 Rt Parietal Cortex <1cm PTV7 Rt Occipital Cortex <1cm PTV8 Rt Parietal Sulcus <1cm PTV9 Hi Rt Parietal Gyrus punctate PTV10 Hi Post Rt Frontal Cortex <1cm PTV11 Post Lt Frontal Cortex 26mm PTV12 Parasagittal Lt Frontal Cortex punctate PTV13 Ant Lt Frontal Cortex <1cm PTV14 Rt Temporal White Matter  punctate PTV15 Rt Occipital 12mm   This constitutes a special treatment procedure due to the ablative dose delivered and the technical nature of treatment.  This highly technical modality of treatment ensures that the ablative dose is centered on the patient's tumor while sparing normal tissues from excessive dose and risk of detrimental effects.  STEREOTACTIC TREATMENT MANAGEMENT:  Following delivery, the patient was transported to nursing in stable condition and monitored for possible acute effects.  Vital signs were recorded BP 120/83 (BP Location: Right Arm, Patient Position: Sitting, Cuff Size: Normal)   Pulse 71   Temp 97.8 F (36.6 C)   Resp 20   SpO2 100% . The patient tolerated treatment without significant acute effects, and was discharged to home in stable condition.    PLAN: Follow-up in one month. Continue RT to bone lesions as planned.  ________________________________   [name], MD

## 2019-12-04 NOTE — Progress Notes (Signed)
  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  Cancer Staging Stage IV NSCLC  Intent: Palliative  Radiation Treatment Dates: 10/28/2019 through 11/11/2019  Site Technique Total Dose (Gy) Dose per Fx (Gy) Completed Fx Beam Energies  Ilium, Right: Pelvis_Rt 3D 20/20 4 5/5 10X, 15X  Brain: Brain_SRS IMRT 20/20 20 1/1 6XFFF  Thoracic Spine: Spine_T 3D 30/30 3 10/10 10X, 15X    Brain SRS notes: ExacTrac, 7 VMAT Beams Max dose = 128.5%; 20Gy/57fx PTV1 Lt Paramedian Cerebellum 67mm PTV2 Lower Vermis <1cm PTV3 Rt Cerebellum <1cm PTV4 Upper Rt Paramedian Vermis <1cm PTV5 Rt Occipital <1cm PTV6 Rt Parietal Cortex <1cm PTV7 Rt Occipital Cortex <1cm PTV8 Rt Parietal Sulcus <1cm PTV9 Hi Rt Parietal Gyrus punctate PTV10 Hi Post Rt Frontal Cortex <1cm PTV11 Post Lt Frontal Cortex 29mm PTV12 Parasagittal Lt Frontal Cortex punctate PTV13 Ant Lt Frontal Cortex <1cm PTV14 Rt Temporal White Matter punctate PTV15 Rt Occipital 37mm  Narrative: The patient tolerated radiation therapy relatively well.   Plan: The patient will follow-up with radiation oncology in 3 weeks.  -----------------------------------  [name], MD

## 2019-12-07 ENCOUNTER — Encounter: Payer: Self-pay | Admitting: Radiation Oncology

## 2019-12-07 NOTE — Progress Notes (Signed)
Radiation Oncology         [phone] ________________________________  Name: Virginia [name] MRN: [id number]  Date: 12/04/2019  DOB: [birth year]  Follow-Up Visit Note in person Outpatient  CC: Elby Showers, MD  Ventura Sellers, MD  Diagnosis and Prior Radiotherapy:    ICD-10-CM   1. Brain metastases [facility])  C79.31     Radiation Treatment Dates: 10/28/2019 through 11/11/2019  Site Technique Total Dose (Gy) Dose per Fx (Gy) Completed Fx Beam Energies  Ilium, Right: Pelvis_Rt 3D 20/20 4 5/5 10X, 15X  Brain: Brain_SRS IMRT 20/20 20 1/1 6XFFF  Thoracic Spine: Spine_T 3D 30/30 3 10/10 10X, 15X    CHIEF COMPLAINT: Here for follow-up and surveillance of metastastic cancer  Narrative:    Ms. Passey presents for follow up of radiation completed 11/11/19 to her T-spine, R Ilium and Brain SRS on 11/06/19. She saw Dr. Lorenso Courier last on 11/18/19 and will see him again on 12/23/19. She reports feeling much better. She is able to ambulate, and is back to working her normal hours at work. She reports a recent minor headache relieved with Tylenol. She continues her decadron taper. She does take 5 mg oxycodone every 4-5 hours.  She and her husband are present today and they are both pleased with her progress.  She is taking the oral systemic therapy as prescribed without any concerns.  She denies any symptoms of esophagitis and is still taking sucralfate.  She is wondering if she should still take that.  ALLERGIES:  has No Known Allergies.  Meds: Current Outpatient Medications  Medication Sig Dispense Refill  . acetaminophen (TYLENOL) 500 MG tablet Take 500-1,000 mg by mouth every 6 (six) hours as needed (for pain.).    Marland Kitchen albuterol (VENTOLIN HFA) 108 (90 Base) MCG/ACT inhaler Inhale 2 puffs into the lungs every 6 (six) hours as needed for wheezing or shortness of breath. 6.7 g 11  . ALPRAZolam (XANAX) 0.25 MG tablet TAKE 1 TABLET BY MOUTH TWICE DAILY AS NEEDED FOR ANXIETY 60 tablet 5  . apixaban  (ELIQUIS) 5 MG TABS tablet 1 tablet (5mg ) twice daily 60 tablet 2  . APPLE CIDER VINEGAR PO Take 450 mg by mouth daily.    . Budeson-Glycopyrrol-Formoterol (BREZTRI AEROSPHERE) 160-9-4.8 MCG/ACT AERO Inhale 2 puffs into the lungs 2 (two) times daily.    Marland Kitchen dexamethasone (DECADRON) 2 MG tablet Take 1 tablet (2 mg total) by mouth 2 (two) times daily. (Patient taking differently: Take 2 mg by mouth 2 (two) times daily. She is tapering her decadron. She is taking 1/2 per day at this time.) 60 tablet 2  . ELDERBERRY PO Take 1,250 mg by mouth daily.    . hydrochlorothiazide (HYDRODIURIL) 25 MG tablet TAKE 1 TABLET(25 MG) BY MOUTH DAILY (Patient taking differently: Take 25 mg by mouth daily. ) 90 tablet 0  . hydrocortisone (ANUSOL-HC) 2.5 % rectal cream Place 1 application rectally 2 (two) times daily. 30 g 0  . lidocaine (XYLOCAINE) 2 % solution Use as directed 15 mLs in the mouth or throat every 4 (four) hours as needed for mouth pain. 100 mL 1  . lidocaine-prilocaine (EMLA) cream Apply 1 application topically as needed. Apply to port as needed. 30 g 0  . losartan (COZAAR) 100 MG tablet TAKE ONE TABLET BY MOUTH DAILY 90 tablet 0  . ondansetron (ZOFRAN) 4 MG tablet Take 1 tablet (4 mg total) by mouth every 8 (eight) hours as needed for nausea or vomiting. 20 tablet 0  .  OVER THE COUNTER MEDICATION Apply 1 application topically daily as needed (shoulder pain.). [place] Gel OTC    . oxyCODONE (OXY IR/ROXICODONE) 5 MG immediate release tablet Take 1-2 tablets (5-10 mg total) by mouth every 4 (four) hours as needed for severe pain or breakthrough pain. 180 tablet 0  . pantoprazole (PROTONIX) 40 MG tablet TAKE 1 TABLET(40 MG) BY MOUTH DAILY 90 tablet 3  . polyethylene glycol (MIRALAX / GLYCOLAX) 17 g packet Take 17 g by mouth daily.    . Probiotic Product (EQL PROBIOTIC COLON SUPPORT) CAPS Take 1 capsule by mouth daily.    . rosuvastatin (CRESTOR) 5 MG tablet TAKE 1 TABLET BY MOUTH THREE TIMES WEEKLY AS DIRECTED  (Patient taking differently: Take 5 mg by mouth every Monday, Wednesday, and Friday. TAKE 1 TABLET BY MOUTH THREE TIMES WEEKLY AS DIRECTED) 36 tablet 1  . sennosides-docusate sodium (SENOKOT-S) 8.6-50 MG tablet Take 1 tablet by mouth daily. 2 in the morning and 2 at night. Adjust as needed    . sucralfate (CARAFATE) 1 g tablet Dissolve 1 tablet in 10 mL H20 and swallow 30 min prior to meals and bedtime. 50 tablet 3  . TABRECTA 200 MG tablet TAKE 2 TABLETS (400 MG TOTAL) BY MOUTH 2 (TWO) TIMES DAILY. 56 tablet 1  . Vitamin D3 (VITAMIN D) 25 MCG tablet Take 2,000 Units by mouth daily.    Marland Kitchen aspirin 325 MG EC tablet Take 325 mg by mouth every 8 (eight) hours as needed for pain.    . cyclobenzaprine (FLEXERIL) 10 MG tablet Take 1 tablet (10 mg total) by mouth 3 (three) times daily as needed for muscle spasms. (Patient not taking: Reported on 10/28/2019) 60 tablet 0  . potassium chloride (KLOR-CON) 20 MEQ packet Take 20 mEq by mouth 2 (two) times daily. (Patient not taking: Reported on 12/04/2019) 20 packet 0  . traZODone (DESYREL) 50 MG tablet Take 1 tablet (50 mg total) by mouth at bedtime. (Patient not taking: Reported on 11/18/2019) 30 tablet 3   No current facility-administered medications for this encounter.    Physical Findings: The patient is in no acute distress. Patient is alert and oriented.  height is 5\' 2"  (1.575 m) and weight is 143 lb 2 oz (64.9 kg). Her temporal temperature is 98.3 F (36.8 C). Her blood pressure is 124/94 (abnormal) and her pulse is 61. Her respiration is 18 and oxygen saturation is 99%. .     No oral thrush.  Pleasant affect.  No residual radiation changes over the skin of her back.   Lab Findings: Lab Results  Component Value Date   WBC 9.6 12/02/2019   HGB 12.1 12/02/2019   HCT 36.7 12/02/2019   MCV 90.8 12/02/2019   PLT 281 12/02/2019    Radiographic Findings: VAS [name] LOWER EXTREMITY VENOUS (DVT)  Result Date: 11/09/2019  Lower Venous DVTStudy Indications:  Pain.  Risk Factors: Cancer Metastatic lung cancer. Comparison Study: No prior study on file Performing Technologist: Sharion Dove RVS  Examination Guidelines: A complete evaluation includes B-mode imaging, spectral Doppler, color Doppler, and power Doppler as needed of all accessible portions of each vessel. Bilateral testing is considered an integral part of a complete examination. Limited examinations for reoccurring indications may be performed as noted. The reflux portion of the exam is performed with the patient in reverse Trendelenburg.  +-------+---------------+---------+-----------+----------+--------------+ RIGHT  CompressibilityPhasicitySpontaneityPropertiesThrombus Aging +-------+---------------+---------+-----------+----------+--------------+ CFV    Full           Yes  Yes                                 +-------+---------------+---------+-----------+----------+--------------+ SFJ    Full                                                        +-------+---------------+---------+-----------+----------+--------------+ FV ProxFull                                                        +-------+---------------+---------+-----------+----------+--------------+   +---------+---------------+---------+-----------+----------+--------------+ LEFT     CompressibilityPhasicitySpontaneityPropertiesThrombus Aging +---------+---------------+---------+-----------+----------+--------------+ CFV      Full           Yes      Yes                                 +---------+---------------+---------+-----------+----------+--------------+ SFJ      Full                                                        +---------+---------------+---------+-----------+----------+--------------+ FV Prox  Full                                                        +---------+---------------+---------+-----------+----------+--------------+ FV Mid   Full                                                         +---------+---------------+---------+-----------+----------+--------------+ FV DistalFull                                                        +---------+---------------+---------+-----------+----------+--------------+ PFV      Full                                                        +---------+---------------+---------+-----------+----------+--------------+ POP      Partial        No       No                   acute, mobile  +---------+---------------+---------+-----------+----------+--------------+ PTV      None  Acute          +---------+---------------+---------+-----------+----------+--------------+ PERO     None                                         Acute          +---------+---------------+---------+-----------+----------+--------------+     Summary: RIGHT: - No evidence of common femoral vein obstruction.  LEFT: - Findings consistent with acute deep vein thrombosis involving the left popliteal vein, left posterior tibial veins, and left peroneal veins.  *See table(s) above for measurements and observations. Electronically signed by Ruta Hinds MD on 11/09/2019 at 5:55:27 PM.    Final     Impression/Plan: She is healing well from radiotherapy to the brain and to painful bony sites with good palliative results.  We will order an MRI of her brain in 2 months and I will see her after that for reassessment and surveillance.  She will continue systemic therapy under the care of Dr. Lorenso Courier and systemic imaging will be ordered at his discretion.  I told her she can stop the sucralfate if she is no longer having symptoms of esophagitis.  She will continue her Decadron taper as prescribed and continue Protonix while she is on Decadron.  She knows not to hesitate to call me if she has any issues in the interim.  I wished her the best until we see each other again in about 2 months.   On date of  service, in total, I spent 20 minutes on this encounter.  Patient was seen in person. _____________________________________   [name], MD

## 2019-12-16 ENCOUNTER — Telehealth: Payer: Self-pay | Admitting: Internal Medicine

## 2019-12-16 ENCOUNTER — Other Ambulatory Visit: Payer: Self-pay | Admitting: *Deleted

## 2019-12-16 DIAGNOSIS — R918 Other nonspecific abnormal finding of lung field: Secondary | ICD-10-CM

## 2019-12-16 DIAGNOSIS — Z515 Encounter for palliative care: Secondary | ICD-10-CM | POA: Insufficient documentation

## 2019-12-16 MED ORDER — OXYCODONE HCL 5 MG PO TABS: 5.0000 mg | ORAL_TABLET | ORAL | 0 refills | Status: DC | PRN

## 2019-12-16 MED FILL — TABRECTA 200 MG TABS: 200 | 14 days supply | Qty: 56 | Fill #1

## 2019-12-16 NOTE — Progress Notes (Signed)
Patient's husband called about recent concern.  He states that at last appointment patient was hypotensive and Dr Lorenso Courier stopped her diuretic and bp medications.  He also supplemented her with potassium because of hypokalemia.  Patient reports lethargy and ankle swelling.  [facility] clinic agreed to see her tomorrow.  BMP ordered.  scheduling appt sent.

## 2019-12-16 NOTE — Progress Notes (Signed)
Phone call with patient's husband Virginia [name] and primary caregiver. Virginia [name] is overall much improved. Pain control has allowed her to sleep better and improved her cognitive function as well. Virginia [name] reports she was able to go back to work a few days last week. She recently completed her last radiation treatment. I saw her last on 2/26 for symptom management related to bone mets and cancer related pain. She has done well on her current regimen and I anticipate decreased opioid needs moving forward. I discussed a plan to follow up with Virginia [name] in one month- at that point I would like to consider transitioning her to a long acting opioid and IR breakthrough at lowest possible doses. Will refill her current prescription. Reviewed requirements for safe opioid prescribing and PMPD.  Lane Hacker, DO Palliative Medicine

## 2019-12-17 ENCOUNTER — Ambulatory Visit (HOSPITAL_COMMUNITY)
Admission: RE | Admit: 2019-12-17 | Discharge: 2019-12-17 | Disposition: A | Discharge status: Home / Self Care | Payer: BC Managed Care – PPO | Source: Ambulatory Visit | Attending: Medical | Admitting: Medical

## 2019-12-17 ENCOUNTER — Inpatient Hospital Stay (HOSPITAL_BASED_OUTPATIENT_CLINIC_OR_DEPARTMENT_OTHER): Payer: BC Managed Care – PPO | Admitting: Medical

## 2019-12-17 ENCOUNTER — Other Ambulatory Visit: Payer: Self-pay

## 2019-12-17 ENCOUNTER — Inpatient Hospital Stay: Payer: BC Managed Care – PPO | Attending: Hematology and Oncology | Admitting: Medical

## 2019-12-17 VITALS — BP 122/82 | HR 89 | Temp 98.7°F | Resp 17 | Ht 62.0 in | Wt 148.3 lb

## 2019-12-17 DIAGNOSIS — C7931 Secondary malignant neoplasm of brain: Secondary | ICD-10-CM | POA: Insufficient documentation

## 2019-12-17 DIAGNOSIS — C3431 Malignant neoplasm of lower lobe, right bronchus or lung: Secondary | ICD-10-CM | POA: Insufficient documentation

## 2019-12-17 DIAGNOSIS — Z596 Low income: Secondary | ICD-10-CM | POA: Diagnosis not present

## 2019-12-17 DIAGNOSIS — Z95828 Presence of other vascular implants and grafts: Secondary | ICD-10-CM | POA: Diagnosis not present

## 2019-12-17 DIAGNOSIS — R600 Localized edema: Secondary | ICD-10-CM | POA: Diagnosis not present

## 2019-12-17 DIAGNOSIS — M7989 Other specified soft tissue disorders: Secondary | ICD-10-CM | POA: Insufficient documentation

## 2019-12-17 DIAGNOSIS — R0602 Shortness of breath: Secondary | ICD-10-CM | POA: Diagnosis not present

## 2019-12-17 DIAGNOSIS — Z809 Family history of malignant neoplasm, unspecified: Secondary | ICD-10-CM | POA: Diagnosis not present

## 2019-12-17 DIAGNOSIS — I10 Essential (primary) hypertension: Secondary | ICD-10-CM | POA: Diagnosis not present

## 2019-12-17 DIAGNOSIS — Z8249 Family history of ischemic heart disease and other diseases of the circulatory system: Secondary | ICD-10-CM | POA: Diagnosis not present

## 2019-12-17 DIAGNOSIS — R918 Other nonspecific abnormal finding of lung field: Secondary | ICD-10-CM

## 2019-12-17 DIAGNOSIS — Z814 Family history of other substance abuse and dependence: Secondary | ICD-10-CM | POA: Diagnosis not present

## 2019-12-17 DIAGNOSIS — Z87891 Personal history of nicotine dependence: Secondary | ICD-10-CM | POA: Insufficient documentation

## 2019-12-17 DIAGNOSIS — Z79899 Other long term (current) drug therapy: Secondary | ICD-10-CM | POA: Diagnosis not present

## 2019-12-17 DIAGNOSIS — E876 Hypokalemia: Secondary | ICD-10-CM | POA: Diagnosis not present

## 2019-12-17 DIAGNOSIS — C7951 Secondary malignant neoplasm of bone: Secondary | ICD-10-CM | POA: Diagnosis not present

## 2019-12-17 LAB — BASIC METABOLIC PANEL - CANCER CENTER ONLY
Anion gap: 3 — ABNORMAL LOW (ref 5–15)
BUN: 9 mg/dL (ref 8–23)
CO2: 28 mmol/L (ref 22–32)
Calcium: 8.3 mg/dL — ABNORMAL LOW (ref 8.9–10.3)
Chloride: 108 mmol/L (ref 98–111)
Creatinine: 0.62 mg/dL (ref 0.44–1.00)
GFR, Est AFR Am: >60 mL/min (ref >60)
GFR, Estimated: >60 mL/min (ref >60)
Glucose, Bld: 125 mg/dL — ABNORMAL HIGH (ref 70–99)
Potassium: 3.9 mmol/L (ref 3.5–5.1)
Sodium: 139 mmol/L (ref 135–145)

## 2019-12-17 MED ORDER — SODIUM CHLORIDE 0.9% FLUSH
10.0000 mL | Freq: Once | INTRAVENOUS | Status: AC
  Administered 2019-12-17: 10 mL
  Filled 2019-12-17: qty 10

## 2019-12-17 MED ORDER — FUROSEMIDE 20 MG PO TABS: ORAL_TABLET | ORAL | 1 refills | Status: DC

## 2019-12-17 MED ORDER — HEPARIN SOD (PORK) LOCK FLUSH 100 UNIT/ML IV SOLN
500.0000 [IU] | Freq: Once | INTRAVENOUS | Status: AC
  Administered 2019-12-17: 500 [IU]
  Filled 2019-12-17: qty 5

## 2019-12-17 NOTE — Progress Notes (Signed)
Patient called with results.

## 2019-12-17 NOTE — Progress Notes (Signed)
PA Lucianne Lei called pt with results of labs and CXR from today.

## 2019-12-17 NOTE — Progress Notes (Signed)
Symptoms Management Clinic Progress Note   Virginia [name] [id number] [birth year] 64 y.o.  Virginia [name] is managed by Dr. Narda Rutherford  Actively treated with chemotherapy/immunotherapy/hormonal therapy: yes  Current therapy: Tabrecta  Next scheduled appointment with provider: 12/23/2019  Assessment: Plan:    Right lower lobe lung mass - Plan: DG Chest 2 View  Brain metastases [facility]) - Plan: DG Chest 2 View  Port-A-Cath in place - Plan: sodium chloride flush (NS) 0.9 % injection 10 mL, heparin lock flush 100 unit/mL  Localized edema - Plan: furosemide (LASIX) 20 MG tablet  Bone metastases (HCC)  Hypokalemia   Metastatic lung cancer with brain and bone metastasis: Virginia [name] continues to be managed by Dr. Lorenso Courier and is currently treated with Tabrecta.  She is scheduled to be seen in follow-up on 12/23/2019.  She was referred for a chest x-ray today which showed:  FINDINGS: Cardiac shadow is stable.  Right-sided chest wall port is noted with the tip at the cavoatrial junction.  Improved aeration in the right base is seen when compared with the prior exam.  Some residual infrahilar density is seen.  No sizable effusion is noted.  No new focal abnormality is noted.  IMPRESSION: Significant improvement in the degree of the right lower lobe mass lesion with only minimal residual infrahilar density identified.  No new focal abnormality is noted.  Localized bilateral lower extremity edema: The patient had previously been on hydrochlorothiazide 25 mg once daily but had this discontinued as she was noted to have hypokalemia.  I have told her to continue holding her hydrochlorothiazide but have given her Lasix 20 mg once daily for 3 days.  I have told her to stop Lasix after 3 days and to restart it if she has recurrent bilateral lower extremity edema.  I have also encouraged her to eat bananas and oranges use.  Hypokalemia: A basic chemistry panel was completed today and  returned showing a potassium of 3.9.  The patient has been told to increase her intake of bananas and oranges especially when she is taking Lasix as needed.  Please see After Visit Summary for patient specific instructions.  Future Appointments  Date Time Provider [place]  12/23/2019  9:15 AM [facility] LAB 5 [facility] None  12/23/2019  9:30 AM [facility] [place] FLUSH [facility] None  12/23/2019 10:00 AM Orson Slick, MD [facility] None    Orders Placed This Encounter  Procedures  . DG Chest 2 View       Subjective:   Patient ID:  Virginia [name] is a 64 y.o. (DOB 1955-12-16) female.  Chief Complaint:  Chief Complaint  Patient presents with  . Leg Swelling    HPI Virginia [name] is a 64 y.o. female with a diagnosis of a metastatic lung cancer with brain and bone metastasis. Virginia [name] continues to be managed by Dr. Lorenso Courier and is currently treated with Tabrecta.  She presents to the clinic today with a report of increasing bilateral ankle and foot edema since stopping hydrochlorothiazide after being noted to have hypokalemia.  She also reports that she has had coughing and dyspnea on exertion as well as fatigue. She was referred for a chest x-ray today which showed:  FINDINGS: Cardiac shadow is stable.  Right-sided chest wall port is noted with the tip at the cavoatrial junction.  Improved aeration in the right base is seen when compared with the prior exam.  Some residual infrahilar density is seen.  No sizable effusion  is noted.  No new focal abnormality is noted.  IMPRESSION: Significant improvement in the degree of the right lower lobe mass lesion with only minimal residual infrahilar density identified.  No new focal abnormality is noted.  Medications: I have reviewed the patient's current medications.  Allergies: No Known Allergies  Past Medical History:  Diagnosis Date  . Anxiety   . Cancer ([place])   . Dyspnea   . GERD (gastroesophageal reflux  disease)   . Hypercholesterolemia    per pt, she does not have elevated lipids  . Hypertension   . Tobacco abuse     Past Surgical History:  Procedure Laterality Date  . ABDOMINAL HYSTERECTOMY     partial/ left ovaries  . CHOLECYSTECTOMY    . DILATION AND CURETTAGE OF UTERUS    . IR IMAGING GUIDED PORT INSERTION  10/23/2019  . LIPOMA EXCISION  2018   removed under left breast and right thigh.  . TUBAL LIGATION      Family History  Problem Relation Age of Onset  . Heart disease Father   . Drug abuse Daughter   . Drug abuse Son   . Cancer Sister   . Heart disease Brother   . Heart attack Brother     Social History   Socioeconomic History  . Marital status: Married    Spouse name: Not on file  . Number of children: 3  . Years of education: Not on file  . Highest education level: Not on file  Occupational History  . Occupation: owns Medical illustrator: [employer]  Tobacco Use  . Smoking status: Former Smoker    Packs/day: 0.25    Quit date: 10/31/2012    Years since quitting: 7.1  . Smokeless tobacco: Never Used  Substance and Sexual Activity  . Alcohol use: Yes    Alcohol/week: 0.0 standard drinks    Comment: rare  . Drug use: No  . Sexual activity: Yes  Other Topics Concern  . Not on file  Social History Narrative  . Not on file   Social Determinants of Health   Financial Resource Strain:   . Difficulty of Paying Living Expenses:   Food Insecurity:   . Worried About Charity fundraiser in the Last Year:   . Arboriculturist in the Last Year:   Transportation Needs:   . Film/video editor (Medical):   Marland Kitchen Lack of Transportation (Non-Medical):   Physical Activity:   . Days of Exercise per Week:   . Minutes of Exercise per Session:   Stress:   . Feeling of Stress :   Social Connections:   . Frequency of Communication with Friends and Family:   . Frequency of Social Gatherings with Friends and Family:   . Attends Religious Services:   .  Active Member of Clubs or Organizations:   . Attends Archivist Meetings:   Marland Kitchen Marital Status:   Intimate Partner Violence:   . Fear of Current or Ex-Partner:   . Emotionally Abused:   Marland Kitchen Physically Abused:   . Sexually Abused:     Past Medical History, Surgical history, Social history, and Family history were reviewed and updated as appropriate.   Please see review of systems for further details on the patient's review from today.   Review of Systems:  Review of Systems  Constitutional: Negative for chills, diaphoresis and fever.  HENT: Negative for trouble swallowing.   Respiratory: Positive for shortness of breath. Negative  for cough, choking, chest tightness, wheezing and stridor.   Cardiovascular: Positive for leg swelling. Negative for chest pain and palpitations.    Objective:   Physical Exam:  BP 122/82 (BP Location: Right Arm, Patient Position: Sitting)   Pulse 89   Temp 98.7 F (37.1 C) (Temporal)   Resp 17   Ht 5\' 2"  (1.575 m)   Wt 148 lb 4.8 oz (67.3 kg)   SpO2 98%   BMI 27.12 kg/m  ECOG: 1  Physical Exam Constitutional:      General: She is not in acute distress.    Appearance: She is not diaphoretic.  HENT:     Head: Normocephalic and atraumatic.  Eyes:     General: No scleral icterus.       Right eye: No discharge.        Left eye: No discharge.     Conjunctiva/sclera: Conjunctivae normal.  Cardiovascular:     Rate and Rhythm: Normal rate and regular rhythm.     Heart sounds: Normal heart sounds. No murmur. No friction rub. No gallop.   Pulmonary:     Effort: Pulmonary effort is normal. No respiratory distress.     Breath sounds: No stridor. Examination of the right-lower field reveals decreased breath sounds. Examination of the left-lower field reveals decreased breath sounds. Decreased breath sounds present. No wheezing or rales.  Musculoskeletal:        General: No tenderness or deformity.     Comments: Mild edema of the feet and  ankles bilaterally.  The calves measured 35 cm at 11 cm distal to the inferior pole of the patella bilaterally.  Skin:    General: Skin is warm and dry.     Coloration: Skin is not pale.     Findings: No erythema.  Neurological:     Mental Status: She is alert.     Coordination: Coordination normal.     Gait: Gait normal.  Psychiatric:        Behavior: Behavior normal.        Thought Content: Thought content normal.        Judgment: Judgment normal.     Lab Review:     Component Value Date/Time   NA 139 12/17/2019 1007   K 3.9 12/17/2019 1007   CL 108 12/17/2019 1007   CO2 28 12/17/2019 1007   GLUCOSE 125 (H) 12/17/2019 1007   BUN 9 12/17/2019 1007   CREATININE 0.62 12/17/2019 1007   CREATININE 0.58 11/15/2017 0948   CALCIUM 8.3 (L) 12/17/2019 1007   PROT 5.9 (L) 12/02/2019 1118   ALBUMIN 3.0 (L) 12/02/2019 1118   AST 17 12/02/2019 1118   ALT 42 12/02/2019 1118   ALKPHOS 107 12/02/2019 1118   BILITOT 0.4 12/02/2019 1118   GFRNONAA >60 12/17/2019 1007   GFRNONAA 100 11/15/2017 0948   GFRAA >60 12/17/2019 1007   GFRAA 116 11/15/2017 0948       Component Value Date/Time   WBC 9.6 12/02/2019 1118   WBC 17.5 (H) 10/24/2019 1810   RBC 4.04 12/02/2019 1118   HGB 12.1 12/02/2019 1118   HCT 36.7 12/02/2019 1118   PLT 281 12/02/2019 1118   MCV 90.8 12/02/2019 1118   MCH 30.0 12/02/2019 1118   MCHC 33.0 12/02/2019 1118   RDW 14.4 12/02/2019 1118   LYMPHSABS 1.1 12/02/2019 1118   MONOABS 0.9 12/02/2019 1118   EOSABS 0.1 12/02/2019 1118   BASOSABS 0.0 12/02/2019 1118   -------------------------------  Imaging from last 24 hours (  if applicable):  Radiology interpretation: DG Chest 2 View  Result Date: 12/17/2019 CLINICAL DATA:  History of lung carcinoma and shortness of breath EXAM: CHEST - 2 VIEW COMPARISON:  10/24/2019 FINDINGS: Cardiac shadow is stable. Right-sided chest wall port is noted with the tip at the cavoatrial junction. Improved aeration in the right base  is seen when compared with the prior exam. Some residual infrahilar density is seen. No sizable effusion is noted. No new focal abnormality is noted. IMPRESSION: Significant improvement in the degree of the right lower lobe mass lesion with only minimal residual infrahilar density identified. No new focal abnormality is noted. Electronically Signed   By: Inez Catalina M.D.   On: 12/17/2019 11:12

## 2019-12-17 NOTE — Patient Instructions (Signed)

## 2019-12-22 ENCOUNTER — Other Ambulatory Visit: Payer: Self-pay | Admitting: Hematology and Oncology

## 2019-12-22 DIAGNOSIS — C7931 Secondary malignant neoplasm of brain: Secondary | ICD-10-CM

## 2019-12-22 DIAGNOSIS — C3491 Malignant neoplasm of unspecified part of right bronchus or lung: Secondary | ICD-10-CM

## 2019-12-22 DIAGNOSIS — R918 Other nonspecific abnormal finding of lung field: Secondary | ICD-10-CM

## 2019-12-23 ENCOUNTER — Other Ambulatory Visit: Payer: Self-pay

## 2019-12-23 ENCOUNTER — Inpatient Hospital Stay: Payer: BC Managed Care – PPO | Admitting: Hematology and Oncology

## 2019-12-23 ENCOUNTER — Inpatient Hospital Stay: Payer: BC Managed Care – PPO

## 2019-12-23 ENCOUNTER — Ambulatory Visit: Payer: Self-pay | Admitting: Radiation Oncology

## 2019-12-23 VITALS — BP 109/93 | HR 89 | Temp 98.7°F | Resp 18 | Ht 62.0 in | Wt 145.8 lb

## 2019-12-23 DIAGNOSIS — Z79899 Other long term (current) drug therapy: Secondary | ICD-10-CM | POA: Diagnosis not present

## 2019-12-23 DIAGNOSIS — C7951 Secondary malignant neoplasm of bone: Secondary | ICD-10-CM | POA: Diagnosis not present

## 2019-12-23 DIAGNOSIS — C7931 Secondary malignant neoplasm of brain: Secondary | ICD-10-CM | POA: Diagnosis not present

## 2019-12-23 DIAGNOSIS — Z87891 Personal history of nicotine dependence: Secondary | ICD-10-CM | POA: Diagnosis not present

## 2019-12-23 DIAGNOSIS — C3431 Malignant neoplasm of lower lobe, right bronchus or lung: Secondary | ICD-10-CM | POA: Diagnosis not present

## 2019-12-23 DIAGNOSIS — R600 Localized edema: Secondary | ICD-10-CM | POA: Diagnosis not present

## 2019-12-23 DIAGNOSIS — E876 Hypokalemia: Secondary | ICD-10-CM | POA: Diagnosis not present

## 2019-12-23 DIAGNOSIS — I82452 Acute embolism and thrombosis of left peroneal vein: Secondary | ICD-10-CM

## 2019-12-23 DIAGNOSIS — I10 Essential (primary) hypertension: Secondary | ICD-10-CM | POA: Diagnosis not present

## 2019-12-23 DIAGNOSIS — Z814 Family history of other substance abuse and dependence: Secondary | ICD-10-CM | POA: Diagnosis not present

## 2019-12-23 DIAGNOSIS — Z596 Low income: Secondary | ICD-10-CM | POA: Diagnosis not present

## 2019-12-23 DIAGNOSIS — C3491 Malignant neoplasm of unspecified part of right bronchus or lung: Secondary | ICD-10-CM

## 2019-12-23 DIAGNOSIS — R0602 Shortness of breath: Secondary | ICD-10-CM | POA: Diagnosis not present

## 2019-12-23 DIAGNOSIS — C7801 Secondary malignant neoplasm of right lung: Secondary | ICD-10-CM

## 2019-12-23 DIAGNOSIS — Z809 Family history of malignant neoplasm, unspecified: Secondary | ICD-10-CM | POA: Diagnosis not present

## 2019-12-23 DIAGNOSIS — Z8249 Family history of ischemic heart disease and other diseases of the circulatory system: Secondary | ICD-10-CM | POA: Diagnosis not present

## 2019-12-23 DIAGNOSIS — Z95828 Presence of other vascular implants and grafts: Secondary | ICD-10-CM

## 2019-12-23 DIAGNOSIS — M7989 Other specified soft tissue disorders: Secondary | ICD-10-CM | POA: Diagnosis not present

## 2019-12-23 LAB — CBC WITH DIFFERENTIAL (CANCER CENTER ONLY)
Abs Immature Granulocytes: 0.02 10*3/uL (ref 0.00–0.07)
Basophils Absolute: 0 10*3/uL (ref 0.0–0.1)
Basophils Relative: 1 %
Eosinophils Absolute: 0.1 10*3/uL (ref 0.0–0.5)
Eosinophils Relative: 2 %
HCT: 33.7 % — ABNORMAL LOW (ref 36.0–46.0)
Hemoglobin: 11.3 g/dL — ABNORMAL LOW (ref 12.0–15.0)
Immature Granulocytes: 0 %
Lymphocytes Relative: 26 %
Lymphs Abs: 1.3 10*3/uL (ref 0.7–4.0)
MCH: 30.9 pg (ref 26.0–34.0)
MCHC: 33.5 g/dL (ref 30.0–36.0)
MCV: 92.1 fL (ref 80.0–100.0)
Monocytes Absolute: 0.6 10*3/uL (ref 0.1–1.0)
Monocytes Relative: 13 %
Neutro Abs: 2.8 10*3/uL (ref 1.7–7.7)
Neutrophils Relative %: 58 %
Platelet Count: 270 10*3/uL (ref 150–400)
RBC: 3.66 MIL/uL — ABNORMAL LOW (ref 3.87–5.11)
RDW: 14.3 % (ref 11.5–15.5)
WBC Count: 4.8 10*3/uL (ref 4.0–10.5)
nRBC: 0 % (ref 0.0–0.2)

## 2019-12-23 LAB — CMP (CANCER CENTER ONLY)
ALT: 52 U/L — ABNORMAL HIGH (ref 0–44)
AST: 25 U/L (ref 15–41)
Albumin: 2.8 g/dL — ABNORMAL LOW (ref 3.5–5.0)
Alkaline Phosphatase: 84 U/L (ref 38–126)
Anion gap: 8 (ref 5–15)
BUN: 7 mg/dL — ABNORMAL LOW (ref 8–23)
CO2: 26 mmol/L (ref 22–32)
Calcium: 8.4 mg/dL — ABNORMAL LOW (ref 8.9–10.3)
Chloride: 107 mmol/L (ref 98–111)
Creatinine: 0.7 mg/dL (ref 0.44–1.00)
GFR, Est AFR Am: >60 mL/min (ref >60)
GFR, Estimated: >60 mL/min (ref >60)
Glucose, Bld: 110 mg/dL — ABNORMAL HIGH (ref 70–99)
Potassium: 3.5 mmol/L (ref 3.5–5.1)
Sodium: 141 mmol/L (ref 135–145)
Total Bilirubin: 0.6 mg/dL (ref 0.3–1.2)
Total Protein: 5.5 g/dL — ABNORMAL LOW (ref 6.5–8.1)

## 2019-12-23 MED ORDER — HEPARIN SOD (PORK) LOCK FLUSH 100 UNIT/ML IV SOLN
500.0000 [IU] | Freq: Once | INTRAVENOUS | Status: AC
  Administered 2019-12-23: 500 [IU] via INTRAVENOUS
  Filled 2019-12-23: qty 5

## 2019-12-23 MED ORDER — SODIUM CHLORIDE 0.9% FLUSH
10.0000 mL | INTRAVENOUS | Status: DC | PRN
  Administered 2019-12-23: 10 mL
  Filled 2019-12-23: qty 10

## 2019-12-23 MED ORDER — FUROSEMIDE 20 MG PO TABS: ORAL_TABLET | ORAL | 1 refills | Status: DC

## 2019-12-23 MED ORDER — CAPMATINIB HCL 200 MG PO TABS: ORAL_TABLET | ORAL | 1 refills | Status: DC

## 2019-12-23 NOTE — Patient Instructions (Signed)

## 2019-12-23 NOTE — Progress Notes (Signed)
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 CT of the head showed showed concerning for metastatic disease involving the left cerebellum and right occipital lobe.  2) 09/30/2019: MRI brain confirms mass within the left cerebellum measures 1.6 x 1.3 x 1.5 cm as well as at least 8 metastatic lesions within the supratentorial and infratentorial brain as outlined 3) 10/01/2019: PET CT scan revealed hypermetabolic infrahilar right lower lobe mass with hypermetabolic nodal metastases in the right hilum, mediastinum, right supraclavicular region, left axilla and lower left neck. 4) 10/08/2019: US guided biopsy of the lymph nodes confirms poorly differentiated non-small cell carcinoma. Immunohistochemistry confirms adenocarcinoma of lung primary. PD-L1 and NGS later revealed a MET Exon 14 mutation and TPS of 90%.  5) 10/12/2019: Establish care with Dr. Lorenso Courier  6) 11/04/2019: Day 1 of capmatinib 42m BID 7) 11/09/2019: presented to Rad/Onc with a DVT in LLE. Seen in symptom management clinic and started on apixaban.  8) 12/29/2019: CT C/A/P to be performed.   Interval History:  PNAIYA CORRAL64y.o. female with medical history significant for metastatic adenocarcinoma of the lung presents for a follow up visit. The patient's last visit was on 11/18/2019 at which time she was continued on capmatinib therapy. In the interim since the last visit she has had a visit to Symptom management clinic with VSandi Mealyfor lower extremity swelling and as prescribed lasix.   On exam today Mrs. Friar notes  that she feels well overall.  She reports that she has had improvement in the swelling of her legs with Lasix therapy.  She still has some edema, but has tolerated the Lasix well overall.  She took the Lasix for 3 days and then daily as needed.  She reports that she is not currently in any pain and that she has been breathing without difficulty.  She reports that she does have some dizziness and some occasional nausea, but is otherwise quite well.  Although not in pain while sitting in the clinic today the patient does note that when ambulatory or active the pain levels can increase.  She reports that on average she takes about 5 to 6 pills of oxycodone 5 mg/day.  This is predominantly for the pain that she is having in her back.  Otherwise the patient's weight has been relatively stable at 145 pounds.  Her appetite has been good and she has been able to be physically active.  Otherwise she has no other questions concerns or complaints today.  A full 10 point ROS is otherwise listed below.   MEDICAL HISTORY:  Past Medical History:  Diagnosis Date   Anxiety    Cancer (HMontreal    Dyspnea    GERD (gastroesophageal reflux disease)    Hypercholesterolemia    per pt, she does not have elevated lipids   Hypertension    Tobacco abuse     SURGICAL HISTORY: Past Surgical History:  Procedure Laterality Date   ABDOMINAL HYSTERECTOMY     partial/ left ovaries   CHOLECYSTECTOMY     DILATION AND CURETTAGE OF UTERUS     IR IMAGING GUIDED PORT INSERTION  10/23/2019   LIPOMA EXCISION  2018   removed under left breast and right thigh.   TUBAL LIGATION      ALLERGIES:  has No Known Allergies.  MEDICATIONS:  Current Outpatient Medications  Medication Sig Dispense Refill   acetaminophen (TYLENOL) 500 MG tablet Take 500-1,000 mg by mouth every 6 (six) hours as needed (for pain.).     albuterol (VENTOLIN HFA) 108 (90 Base) MCG/ACT inhaler Inhale 2 puffs into the lungs every 6 (six) hours as  needed for wheezing or shortness of breath. 6.7 g 11   ALPRAZolam (XANAX) 0.25 MG tablet TAKE 1 TABLET BY MOUTH TWICE DAILY AS NEEDED FOR ANXIETY 60 tablet 5   apixaban (ELIQUIS) 5 MG TABS tablet 1 tablet (25m) twice daily 60 tablet 2   APPLE CIDER VINEGAR PO Take 450 mg by mouth daily.     Budeson-Glycopyrrol-Formoterol (BREZTRI AEROSPHERE) 160-9-4.8 MCG/ACT AERO Inhale 2 puffs into the lungs 2 (two) times daily.     ELDERBERRY PO Take 1,250 mg by mouth daily.     furosemide (LASIX) 20 MG tablet 20 mg once daily for 3 days, then stop. May repeat if edema recurs. 30 tablet 1   lidocaine-prilocaine (EMLA) cream Apply 1 application topically as needed. Apply to port as needed. 30 g 0   ondansetron (ZOFRAN) 4 MG tablet Take 1 tablet (4 mg total) by mouth every 8 (eight) hours as needed for nausea or vomiting. 20 tablet 0   OVER THE COUNTER MEDICATION Apply 1 application topically daily as needed (shoulder pain.). CThailandGel OTC     oxyCODONE (OXY IR/ROXICODONE) 5 MG immediate release tablet Take 1-2 tablets (5-10 mg total) by mouth every 4 (four) hours as needed for severe pain or breakthrough pain. 180 tablet 0   pantoprazole (PROTONIX) 40 MG tablet TAKE 1 TABLET(40 MG) BY MOUTH DAILY 90 tablet 3   polyethylene glycol (MIRALAX / GLYCOLAX) 17 g packet Take 17 g by mouth daily.     potassium chloride (KLOR-CON) 20 MEQ packet Take 20 mEq by mouth 2 (two) times daily. (Patient not taking: Reported on 12/04/2019) 20 packet 0   sennosides-docusate sodium (SENOKOT-S) 8.6-50 MG tablet Take 1 tablet by mouth daily. 2 in the morning and 2 at night. Adjust as needed     sucralfate (CARAFATE) 1 g tablet Dissolve 1 tablet in 10 mL H20 and swallow 30 min prior to meals and bedtime. 50 tablet 3   TABRECTA 200 MG tablet TAKE 2 TABLETS (400 MG TOTAL) BY MOUTH 2 (TWO) TIMES DAILY. 56 tablet 1   Vitamin D3 (VITAMIN D) 25 MCG tablet Take 2,000 Units by mouth daily.     No current facility-administered  medications for this visit.    REVIEW OF SYSTEMS:   Constitutional: ( - ) fevers, ( - )  chills , ( - ) night sweats Eyes: ( - ) blurriness of vision, ( - ) double vision, ( - ) watery eyes Ears, nose, mouth, throat, and face: ( - ) mucositis, ( - ) sore throat Respiratory: ( + ) cough, ( - ) dyspnea, ( - ) wheezes Cardiovascular: ( - ) palpitation, ( - ) chest discomfort, ( - ) lower extremity swelling Gastrointestinal:  ( - ) nausea, ( - ) heartburn, ( - ) change in bowel habits Skin: ( - ) abnormal skin rashes Lymphatics: ( - ) new lymphadenopathy, ( - ) easy bruising Neurological: ( - ) numbness, ( - ) tingling, ( - ) new weaknesses Behavioral/Psych: ( - )  mood change, ( - ) new changes  All other systems were reviewed with the patient and are negative.  PHYSICAL EXAMINATION: ECOG PERFORMANCE STATUS: 1 - Symptomatic but completely ambulatory  Vitals:   12/23/19 1005  BP: (!) 109/93  Pulse: 89  Resp: 18  Temp: 98.7 F (37.1 C)  SpO2: 98%   Filed Weights   12/23/19 1005  Weight: 145 lb 12.8 oz (66.1 kg)    GENERAL: well appearing middle aged Caucasian female in NAD  SKIN: skin color, texture, turgor are normal, no rashes or significant lesions EYES: conjunctiva are pink and non-injected, sclera clear LUNGS: clear to auscultation and percussion with normal breathing effort HEART: regular rate & rhythm and no murmurs and no lower extremity edema Musculoskeletal: no cyanosis of digits and no clubbing  PSYCH: alert & oriented x 3, fluent speech NEURO: no focal motor/sensory deficits   LABORATORY DATA:  I have reviewed the data as listed CBC Latest Ref Rng & Units 12/23/2019 12/02/2019 11/18/2019  WBC 4.0 - 10.5 K/uL 4.8 9.6 9.8  Hemoglobin 12.0 - 15.0 g/dL 11.3(L) 12.1 12.6  Hematocrit 36.0 - 46.0 % 33.7(L) 36.7 37.2  Platelets 150 - 400 K/uL 270 281 277    CMP Latest Ref Rng & Units 12/23/2019 12/17/2019 12/02/2019  Glucose 70 - 99 mg/dL 110(H) 125(H) 100(H)  BUN 8 - 23  mg/dL 7(L) 9 9  Creatinine 0.44 - 1.00 mg/dL 0.70 0.62 0.69  Sodium 135 - 145 mmol/L 141 139 139  Potassium 3.5 - 5.1 mmol/L 3.5 3.9 4.1  Chloride 98 - 111 mmol/L 107 108 107  CO2 22 - 32 mmol/L _0 Calcium 8.9 - 10.3 mg/dL 8.4(L) 8.3(L) 8.7(L)  Total Protein 6.5 - 8.1 g/dL 5.5(L) - 5.9(L)  Total Bilirubin 0.3 - 1.2 mg/dL 0.6 - 0.4  Alkaline Phos 38 - 126 U/L 84 - 107  AST 15 - 41 U/L 25 - 17  ALT 0 - 44 U/L 52(H) - 42     RADIOGRAPHIC STUDIES:  DG Chest 2 View  Result Date: 12/17/2019 CLINICAL DATA:  History of lung carcinoma and shortness of breath EXAM: CHEST - 2 VIEW COMPARISON:  10/24/2019 FINDINGS: Cardiac shadow is stable. Right-sided chest wall port is noted with the tip at the cavoatrial junction. Improved aeration in the right base is seen when compared with the prior exam. Some residual infrahilar density is seen. No sizable effusion is noted. No new focal abnormality is noted. IMPRESSION: Significant improvement in the degree of the right lower lobe mass lesion with only minimal residual infrahilar density identified. No new focal abnormality is noted. Electronically Signed   By: Inez Catalina M.D.   On: 12/17/2019 11:12    ASSESSMENT & PLAN ELISANDRA DESHMUKH 64 y.o. female with medical history significant for metastatic adenocarcinoma of the lung presents for a follow up visit. 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   Today Mrs. Osso appears stable compared to previous visits.  She has not lower extremity edema for which the Lasix has been helpful.  Her weight has been stable and she has no new symptoms.  Her pain is under reasonable control with oxycodone 5 mg 4-6 times per day.  Overall her condition is stable.  Piecemeal imaging over the last 3 months has been consistent with shrinkage in the size of tumor, however we will perform full CT chest abdomen pelvis in order to assure that there is no  progression of the tumor while on  capmatinib therapy.  Previously we discussed capmatinib and the expected side effect from this medication. Capmatinib can cause peripheral edema, diarrhea, increased liver enzymes, drop in blood counts, fatigue, increase in serum creatinine, and rarely acute pancreatitis or acute renal failure. We also discussed the efficacy of the medication by discussing the phase II multicenter multicohort GEOMETRY mono-1 trial, which showed the overall response rate was 68% (95% confidence interval [CI] = 48%--84%), with a complete response in 4%, and the median response duration was 12.6 months (95% CI = 5.5-25.3 months). We also noted that chemotherapy/immunotherapy can be administered after progression or intolerance of capmatinib. The study showed this medication was most effective when used in the first line setting (Among previously treated patients, the overall response rate (all partial responses) was 41% (95% CI = 29%-53%), and the median response duration was 9.7 months (95% CI = 5.5-13.0 months).   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 Started on capmatinib 426m BID on 11/04/2019.  --hold plans to start Carbo/Pem/Pem. This can be started if patient has progression or intolerance on oral therapy.  --patient will be followed initially every 2 weeks with labs and clinic visit. These can be extended to monthly if the patient's labs and clinical status are stable --plan for repeat CT C/A/P on 12/29/2019 (3 months from last scan in Jan 2021) --supportive therapy as listed below.  --RTC after repeat CT C/A/P per patient request. Can resume q2 week visits and consider q 4 weeks once patient is known to be stable on therapy.   #Hypokalemia, stable --likely 2/2 to decreased PO intake, hypovolemia, and BP medications --given her decreased BP would recommend holding losartan and hydrochlorothiazide --prescribed  PO K-dur liquid 276m BID x 10 days to improve potassium --continue to monitor  #Pain Control, improving --continue oxycodone 74m29m6H PRN. Can increase dose to 27m18mH if pain is severe --continue to take with Senokot to prevent opioid induced constipation. Miralax PRN   #Supportive Therapy --will provide patient with Zofran 8mg 62m PRN and compazine 274mg 38mPRN --continue EMLA cream with port --continue to monitor   #Headache #Possible Concussion --appreciate the assistance of Dr. VaslowMickeal Skinnereating her brain metastasis and symptoms. --radiation therapy to the brain completed on 11/16/2019.  --continue to monitor   No orders of the defined types were placed in this encounter.  All questions were answered. The patient knows to call the clinic with any problems, questions or concerns.  A total of more than 30 minutes were spent on this encounter and over half of that time was spent on counseling and coordination of care as outlined above.   Toby Breithaupt TLedell Peoplesepartment of Hematology/Oncology [facility]: [phone]: [phone]: [email or web address]  12/23/2019 10:47 AM   Literature Support:   Wolf JAngelique HolmJY, et al: Capmatinib ([id number] ex14-mutated advanced non-small cell lung cancer: Efficacy data from the phase II GEOMETRY mono-1 study. 2019 ASCO Annual Meeting. Abstract 9004. Presented February 10, 2018.  --the overall response rate was 68% (95% confidence interval [CI] = 48%--84%), with a complete response in 4%, and the median response duration was 12.6 months (95% CI = 5.5-25.3 months).

## 2019-12-24 ENCOUNTER — Ambulatory Visit (HOSPITAL_COMMUNITY)
Admission: RE | Admit: 2019-12-24 | Discharge: 2019-12-24 | Disposition: A | Discharge status: Home / Self Care | Payer: BC Managed Care – PPO | Source: Ambulatory Visit | Attending: Hematology and Oncology | Admitting: Hematology and Oncology

## 2019-12-24 ENCOUNTER — Telehealth: Payer: Self-pay | Admitting: *Deleted

## 2019-12-24 DIAGNOSIS — I82452 Acute embolism and thrombosis of left peroneal vein: Secondary | ICD-10-CM | POA: Diagnosis not present

## 2019-12-24 NOTE — Telephone Encounter (Signed)
TCT Virginia [name], pt's husband with results of LE doppler study done today.  Advised that there was no new clot and likely the swelling is from decreased blood flow. Advised to obtain compression stockings and to keep legs elevated when sitting down. Instructed on signs and symptoms of cellulitis-to be aware of due swelling and poor circulation.  Encouraged high protein, potassium rich foods as pt is taking diuretic for the swelling as well.  Virginia [name] voiced understanding and states he will call with any future questions or concerns.

## 2019-12-24 NOTE — Telephone Encounter (Signed)
-----   Message from Orson Slick, MD sent at 12/24/2019  3:59 PM EDT ----- Please let Virginia [name] know that her US of the lower extremity shows stable blood clot, with no new clot formation. It is likely her left leg swells more due to poor blood flow. She can try a compression stocking/sock to help keep fluid out of the leg.  If the redness and pain worsen it may be a sign of a skin infection on the leg. Please have them call if the redness or pain worsen.  Colan Neptune  ----- Message ----- From: Interface, Three One Seven Sent: 12/24/2019   2:45 PM EDT To: Orson Slick, MD

## 2019-12-24 NOTE — Progress Notes (Signed)
Lower extremity venous has been completed.   Preliminary results in CV Proc.   Abram Sander 12/24/2019 2:45 PM

## 2019-12-24 NOTE — Telephone Encounter (Signed)
Received call from pt's husband, [name].  He states that Virginia [name] is very concerned about the selling in her feet and ankles, especially the the right ankle.  She states it is very tender to the touch though it does not hurt when she walks.. She is concerned that their is another clot, despite being on eliquis.   Spoke with Dr. Lorenso Courier about this. He is ordering a bilateral LE doppler study to rule out DVT. Call made to Vascular lab and they can do Doppler today @ 2pm.    TCT Randy and made him aware of the above appt.  Advised that we will call him after we receive the results of the study.  He voiced understanding and stated that he can bring [name] to this appt. today

## 2019-12-25 ENCOUNTER — Other Ambulatory Visit: Payer: Self-pay | Admitting: Medical

## 2019-12-25 ENCOUNTER — Other Ambulatory Visit: Payer: Self-pay | Admitting: *Deleted

## 2019-12-25 DIAGNOSIS — C7949 Secondary malignant neoplasm of other parts of nervous system: Secondary | ICD-10-CM

## 2019-12-25 DIAGNOSIS — C7931 Secondary malignant neoplasm of brain: Secondary | ICD-10-CM

## 2019-12-25 DIAGNOSIS — R110 Nausea: Secondary | ICD-10-CM

## 2019-12-28 MED FILL — TABRECTA 200 MG TABS: 200 | 14 days supply | Qty: 56 | Fill #0

## 2019-12-29 ENCOUNTER — Encounter (HOSPITAL_COMMUNITY): Payer: Self-pay

## 2019-12-29 ENCOUNTER — Other Ambulatory Visit: Payer: Self-pay

## 2019-12-29 ENCOUNTER — Ambulatory Visit (HOSPITAL_COMMUNITY)
Admission: RE | Admit: 2019-12-29 | Discharge: 2019-12-29 | Disposition: A | Discharge status: Home / Self Care | Payer: BC Managed Care – PPO | Source: Ambulatory Visit | Attending: Hematology and Oncology | Admitting: Hematology and Oncology

## 2019-12-29 ENCOUNTER — Encounter: Payer: Self-pay | Admitting: Hematology and Oncology

## 2019-12-29 DIAGNOSIS — C7951 Secondary malignant neoplasm of bone: Secondary | ICD-10-CM | POA: Diagnosis not present

## 2019-12-29 DIAGNOSIS — C3491 Malignant neoplasm of unspecified part of right bronchus or lung: Secondary | ICD-10-CM | POA: Diagnosis not present

## 2019-12-29 DIAGNOSIS — C3490 Malignant neoplasm of unspecified part of unspecified bronchus or lung: Secondary | ICD-10-CM | POA: Diagnosis not present

## 2019-12-29 MED ORDER — IOHEXOL 300 MG/ML  SOLN
100.0000 mL | Freq: Once | INTRAMUSCULAR | Status: AC | PRN
  Administered 2019-12-29: 09:00:00 100 mL via INTRAVENOUS

## 2019-12-29 MED ORDER — SODIUM CHLORIDE (PF) 0.9 % IJ SOLN
INTRAMUSCULAR | Status: AC
  Filled 2019-12-29: qty 50

## 2019-12-30 ENCOUNTER — Telehealth: Payer: Self-pay | Admitting: *Deleted

## 2019-12-30 ENCOUNTER — Other Ambulatory Visit: Payer: Self-pay | Admitting: *Deleted

## 2019-12-30 DIAGNOSIS — R110 Nausea: Secondary | ICD-10-CM

## 2019-12-30 MED ORDER — ONDANSETRON HCL 4 MG PO TABS: 4.0000 mg | ORAL_TABLET | Freq: Three times a day (TID) | ORAL | 0 refills | Status: DC | PRN

## 2019-12-30 MED FILL — ONDANSETRON HCL 4 MG TABS: 4 | 6 days supply | Qty: 20 | Fill #0

## 2019-12-30 NOTE — Telephone Encounter (Signed)
[name], pt's husband regarding results of CT scans done yesterday. Spoke with [name] who will inform pt of the results. Advised that her tumor is responding well to her current treatment of Tabrecta. The lung tumor and the associated lymph nodes are shrinking.  [name] was very pleased with this and will tell [name].  She still c/o lack of appetite and swelling in her ankles.   Will send in dietary referral to see if that will help/provide additional information that could be useful.  Advised that we will see her back in 2 weeks.

## 2019-12-30 NOTE — Telephone Encounter (Signed)
-----   Message from Orson Slick, MD sent at 12/30/2019  8:39 AM EDT ----- Please call Mrs. Puzio to let her know we have reviewed her CT scan. Results look excellent. The tumor and enlarged lymph nodes are shrinking and appear to be responding very well to the therapy. If they would still like a clinic visit we can arrange for a short in-person visit to go over the CT scan. If not we will see them back in about 2 weeks.  Colan Neptune  ----- Message ----- From: Buel Ream, Rad Results In Sent: 12/29/2019   3:00 PM EDT To: Orson Slick, MD

## 2019-12-31 ENCOUNTER — Telehealth: Payer: Self-pay | Admitting: Hematology and Oncology

## 2019-12-31 NOTE — Telephone Encounter (Signed)
Scheduled appt per 4/21 sch message - pt aware of appt date and time

## 2020-01-05 ENCOUNTER — Other Ambulatory Visit: Payer: Self-pay | Admitting: Radiation Therapy

## 2020-01-05 DIAGNOSIS — C7931 Secondary malignant neoplasm of brain: Secondary | ICD-10-CM

## 2020-01-11 ENCOUNTER — Telehealth: Payer: Self-pay | Admitting: *Deleted

## 2020-01-11 MED FILL — TABRECTA 200 MG TABS: 200 | 14 days supply | Qty: 56 | Fill #1

## 2020-01-11 NOTE — Telephone Encounter (Signed)
Received call from pt's husband, [name]. He states that Virginia [name] had a bad weekend with ongoing nausea and some vomiting. Her appetite is poor, not eating more than a few bites at any given time.  Fluid intake is 32 oz or less per day.  She is tired and sleeping more-she attributes her fatigue to the zofran.  Advised that Zofran is not known for causing sedation. States that she is passing her urine ok but that it is a little bit darker than usual. No issues with diarrhea. She has an appt for labs and Dr. Lorenso Courier on Wednesday of this week.  Will discuss with him to see if he wants her to come back sooner. Darnelle Spangle that I will call her back after speaking with Dr. Lorenso Courier.  Spoke with Dr. Lorenso Courier and he advised that pt should try the zofran more often as she is only taking it maybe once a day. He advised that we see her as scheduled on Wednesday. Scheduling message sent to add appt on for IVF on 01/13/20  TCT Randy and informed him of the above. He states that Virginia [name] is agreeable to taking the Zofran at least 2 x a day to start.  He is agreeable to keeping the Wednesday appts.

## 2020-01-12 ENCOUNTER — Ambulatory Visit: Payer: BC Managed Care – PPO

## 2020-01-12 ENCOUNTER — Other Ambulatory Visit: Payer: Self-pay | Admitting: *Deleted

## 2020-01-12 DIAGNOSIS — I82452 Acute embolism and thrombosis of left peroneal vein: Secondary | ICD-10-CM

## 2020-01-12 DIAGNOSIS — C7801 Secondary malignant neoplasm of right lung: Secondary | ICD-10-CM

## 2020-01-12 NOTE — Progress Notes (Signed)
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 CT of the head showed showed concerning for metastatic disease involving the left cerebellum and right occipital lobe.  2) 09/30/2019: MRI brain confirms mass within the left cerebellum measures 1.6 x 1.3 x 1.5 cm as well as at least 8 metastatic lesions within the supratentorial and infratentorial brain as outlined 3) 10/01/2019: PET CT scan revealed hypermetabolic infrahilar right lower lobe mass with hypermetabolic nodal metastases in the right hilum, mediastinum, right supraclavicular region, left axilla and lower left neck. 4) 10/08/2019: US guided biopsy of the lymph nodes confirms poorly differentiated non-small cell carcinoma. Immunohistochemistry confirms adenocarcinoma of lung primary. PD-L1 and NGS later revealed a MET Exon 14 mutation and TPS of 90%.  5) 10/12/2019: Establish care with Dr. Lorenso Courier  6) 11/04/2019: Day 1 of capmatinib 444m BID 7) 11/09/2019: presented to Rad/Onc with a DVT in LLE. Seen in symptom management clinic and started on apixaban.  8) 12/29/2019: CT C/A/P showed interval decrease in size of mass involving the superior segment of right lower lobe and reduction in mediastinal and left supraclavicular adenopathy.   Interval History:  Virginia FRANEY64y.o. female with medical history significant for metastatic adenocarcinoma of the lung presents for a follow up visit. The patient's last visit was on 12/23/2019 at which time she was continued on capmatinib therapy. In the interim since the last visit she has had  imaging on 12/29/2019 which showed overall improvement in the burden of disease.   On exam today Virginia [name] notes that she has been having difficulties with nausea since her last visit.  She notes that she feels queasy on most days and that her Zofran medication has not been helping much.  She does not have vomiting every day, but reports that she has had been having vomiting today.  She notes that this has been stifling her appetite and that food and coffee smell bad to her.  She also notes that she has not been keeping up with her p.o. hydration.  She also reports that her pain is under good control when not moving.  She reports that at the moment it is 0-10 in severity, however when she is moving throughout the day the pain levels to increase.  She is currently taking oxycodone 5 mg every 4 hours for pain control.  She also notes that she is using a heating pad for hip pain at night.  She notes that she has not increased the dose of oxycodone from 5 mg to 10 mg.  Also she notes that the swelling in her lower extremity is improved and that she is not having any pain in her leg.  While on apixaban she has not had any issues with bleeding reporting no nosebleeds, bruising, or dark stools.  A full 10 point ROS is listed below.  MEDICAL HISTORY:  Past Medical History:  Diagnosis Date  . Anxiety   . Dyspnea   . GERD (gastroesophageal reflux disease)   . Hypercholesterolemia    per pt, she does not have elevated lipids  . Hypertension   . met lung ca dx'd 09/2019  mets to spine, hip and brain  . Tobacco abuse     SURGICAL HISTORY: Past Surgical History:  Procedure Laterality Date  . ABDOMINAL HYSTERECTOMY     partial/ left ovaries  . CHOLECYSTECTOMY    . DILATION AND CURETTAGE OF UTERUS    . IR IMAGING GUIDED PORT INSERTION  10/23/2019  . LIPOMA EXCISION  2018   removed under left breast and right thigh.  . TUBAL LIGATION      ALLERGIES:  has No Known Allergies.  MEDICATIONS:  Current  Outpatient Medications  Medication Sig Dispense Refill  . Cyanocobalamin (VITAMIN B12 PO) Take by mouth daily.    . Multiple Vitamins-Minerals (AIRBORNE PO) Take by mouth daily.    . Vitamin D3 (VITAMIN D) 25 MCG tablet Take 2,000 Units by mouth daily.    Marland Kitchen acetaminophen (TYLENOL) 500 MG tablet Take 500-1,000 mg by mouth every 6 (six) hours as needed (for pain.).    Marland Kitchen albuterol (VENTOLIN HFA) 108 (90 Base) MCG/ACT inhaler Inhale 2 puffs into the lungs every 6 (six) hours as needed for wheezing or shortness of breath. 6.7 g 11  . ALPRAZolam (XANAX) 0.25 MG tablet TAKE 1 TABLET BY MOUTH TWICE DAILY AS NEEDED FOR ANXIETY 60 tablet 5  . apixaban (ELIQUIS) 5 MG TABS tablet 1 tablet (29m) twice daily 60 tablet 2  . Budeson-Glycopyrrol-Formoterol (BREZTRI AEROSPHERE) 160-9-4.8 MCG/ACT AERO Inhale 2 puffs into the lungs 2 (two) times daily.    . capmatinib (TABRECTA) 200 MG tablet TAKE 2 TABLETS (400 MG TOTAL) BY MOUTH 2 (TWO) TIMES DAILY. 56 tablet 1  . furosemide (LASIX) 20 MG tablet 20 mg once daily for 3 days, then stop. May repeat if edema recurs. 30 tablet 1  . lidocaine-prilocaine (EMLA) cream Apply 1 application topically as needed. Apply to port as needed. 30 g 0  . OLANZapine (ZYPREXA) 2.5 MG tablet Take 1 tablet (2.5 mg total) by mouth at bedtime. 14 tablet 1  . ondansetron (ZOFRAN) 4 MG tablet Take 1 tablet (4 mg total) by mouth every 8 (eight) hours as needed for nausea or vomiting. 20 tablet 0  . OVER THE COUNTER MEDICATION Apply 1 application topically daily as needed (shoulder pain.). CThailandGel OTC    . oxyCODONE (OXY IR/ROXICODONE) 5 MG immediate release tablet Take 1-2 tablets (5-10 mg total) by mouth every 4 (four) hours as needed for severe pain or breakthrough pain. 180 tablet 0  . pantoprazole (PROTONIX) 40 MG tablet TAKE 1 TABLET(40 MG) BY MOUTH DAILY 90 tablet 3  . polyethylene glycol (MIRALAX / GLYCOLAX) 17 g packet Take 17 g by mouth daily.    . potassium chloride (KLOR-CON) 20  MEQ packet Take 20 mEq by mouth 2 (two) times daily. (Patient not taking: Reported on 12/04/2019) 20 packet 0  . prochlorperazine (COMPAZINE) 10 MG tablet Take 1 tablet (10 mg total) by mouth every 6 (six) hours as needed for nausea or vomiting. 30 tablet 0  . sennosides-docusate sodium (SENOKOT-S) 8.6-50 MG tablet Take 1 tablet by mouth daily. 2 in the morning and 2 at night. Adjust as needed    . sucralfate (CARAFATE) 1 g tablet Dissolve 1 tablet in 10 mL H20 and swallow 30 min prior to meals and bedtime. (Patient not taking: Reported on 01/13/2020) 50 tablet 3   No current facility-administered medications for this visit.   Facility-Administered Medications Ordered in Other Visits  Medication Dose Route Frequency Provider Last Rate Last Admin  . 0.9 %  sodium chloride infusion  Intravenous Once Orson Slick, MD 500 mL/hr at 01/13/20 1232 New Bag at 01/13/20 1232    REVIEW OF SYSTEMS:   Constitutional: ( - ) fevers, ( - )  chills , ( - ) night sweats Eyes: ( - ) blurriness of vision, ( - ) double vision, ( - ) watery eyes Ears, nose, mouth, throat, and face: ( - ) mucositis, ( - ) sore throat Respiratory: ( + ) cough, ( - ) dyspnea, ( - ) wheezes Cardiovascular: ( - ) palpitation, ( - ) chest discomfort, ( - ) lower extremity swelling Gastrointestinal:  ( - ) nausea, ( - ) heartburn, ( - ) change in bowel habits Skin: ( - ) abnormal skin rashes Lymphatics: ( - ) new lymphadenopathy, ( - ) easy bruising Neurological: ( - ) numbness, ( - ) tingling, ( - ) new weaknesses Behavioral/Psych: ( - ) mood change, ( - ) new changes  All other systems were reviewed with the patient and are negative.  PHYSICAL EXAMINATION: ECOG PERFORMANCE STATUS: 1 - Symptomatic but completely ambulatory  Vitals:   01/13/20 1130  BP: (!) 121/91  Pulse: 84  Resp: 18  Temp: 98.7 F (37.1 C)  SpO2: 100%   Filed Weights   01/13/20 1130  Weight: 138 lb 14.4 oz (63 kg)    GENERAL: well appearing middle  aged Caucasian female in NAD  SKIN: skin color, texture, turgor are normal, no rashes or significant lesions EYES: conjunctiva are pink and non-injected, sclera clear LUNGS: clear to auscultation and percussion with normal breathing effort HEART: regular rate & rhythm and no murmurs and no lower extremity edema Musculoskeletal: no cyanosis of digits and no clubbing. Some tenderness on palpation of right leg.  PSYCH: alert & oriented x 3, fluent speech NEURO: no focal motor/sensory deficits   LABORATORY DATA:  I have reviewed the data as listed CBC Latest Ref Rng & Units 01/13/2020 12/23/2019 12/02/2019  WBC 4.0 - 10.5 K/uL 9.0 4.8 9.6  Hemoglobin 12.0 - 15.0 g/dL 12.2 11.3(L) 12.1  Hematocrit 36.0 - 46.0 % 36.0 33.7(L) 36.7  Platelets 150 - 400 K/uL 263 270 281    CMP Latest Ref Rng & Units 01/13/2020 12/23/2019 12/17/2019  Glucose 70 - 99 mg/dL 129(H) 110(H) 125(H)  BUN 8 - 23 mg/dL 9 7(L) 9  Creatinine 0.44 - 1.00 mg/dL 0.70 0.70 0.62  Sodium 135 - 145 mmol/L 136 141 139  Potassium 3.5 - 5.1 mmol/L 3.8 3.5 3.9  Chloride 98 - 111 mmol/L 107 107 108  CO2 22 - 32 mmol/L _0 Calcium 8.9 - 10.3 mg/dL 8.5(L) 8.4(L) 8.3(L)  Total Protein 6.5 - 8.1 g/dL 5.5(L) 5.5(L) -  Total Bilirubin 0.3 - 1.2 mg/dL 0.6 0.6 -  Alkaline Phos 38 - 126 U/L 74 84 -  AST 15 - 41 U/L 13(L) 25 -  ALT 0 - 44 U/L 17 52(H) -     RADIOGRAPHIC STUDIES:  DG Chest 2 View  Result Date: 12/17/2019 CLINICAL DATA:  History of lung carcinoma and shortness of breath EXAM: CHEST - 2 VIEW COMPARISON:  10/24/2019 FINDINGS: Cardiac shadow is stable. Right-sided chest wall port is noted with the tip at the cavoatrial junction. Improved aeration in the right base is seen when compared with the prior exam. Some residual infrahilar density is seen. No sizable effusion is noted. No new focal abnormality is noted. IMPRESSION: Significant improvement in the degree of the right lower lobe mass lesion with only  minimal residual  infrahilar density identified. No new focal abnormality is noted. Electronically Signed   By: Inez Catalina M.D.   On: 12/17/2019 11:12   CT Chest W Contrast  Result Date: 12/29/2019 CLINICAL DATA:  Restaging non-small cell lung cancer. EXAM: CT CHEST, ABDOMEN, AND PELVIS WITH CONTRAST TECHNIQUE: Multidetector CT imaging of the chest, abdomen and pelvis was performed following the standard protocol during bolus administration of intravenous contrast. CONTRAST:  173m OMNIPAQUE IOHEXOL 300 MG/ML  SOLN COMPARISON:  CT AP 10/24/2019 and PET-CT from 09/30/2018 and MRI thoracic and lumbar spine dated 10/20/2019 FINDINGS: CT CHEST FINDINGS Cardiovascular: Mild cardiac enlargement. New small to moderate pericardial effusion is identified. This measures approximately 1.1 cm in thickness measured along the left heart border, image 43/2. Aortic atherosclerosis. Lad coronary artery atherosclerotic calcifications noted. Mediastinum/Nodes: Normal appearance of the thyroid gland. The trachea appears patent and is midline. Normal appearance of the esophagus. Interval reduction in mediastinal adenopathy. Index right paratracheal lymph node measures 7 mm, image 25/2. Previously 1.8 cm. Index subcarinal lymph node measures 1.2 cm, image 32/2. Previously 2.3 cm. Left supraclavicular node measures 0.6 cm, image number 9/2. Previously 0.8 cm. Lungs/Pleura: No pleural effusion. The lung mass involving the superior segment of right lower lobe appears decreased in size from previous exam. This measures 6.5 cm transverse and 1.9 cm AP. On the previous PET-CT this area measured 7.5 x 3.8 cm. Scattered multifocal ground-glass nodules in both lungs are again identified. Similar to previous exam. Solid nodule in the periphery of the right middle lobe measures 4 mm, image 102/6. Previously 5 mm. Musculoskeletal: There is mixed lytic and sclerotic lesion involving the T8 vertebra corresponding to known metastases as demonstrated on MR from  10/20/2019. Abnormal sclerotic metastasis are also identified involving T7 and T10 are new when compared with previous MRI and PET-CT. CT ABDOMEN PELVIS FINDINGS Hepatobiliary: No focal liver abnormality is seen. Status post cholecystectomy. No biliary dilatation. Pancreas: Unremarkable. No pancreatic ductal dilatation or surrounding inflammatory changes. Spleen: Normal in size without focal abnormality. Adrenals/Urinary Tract: The adrenal glands are normal. No kidney mass or hydronephrosis. The urinary bladder is negative. Stomach/Bowel: Stomach is within normal limits. No evidence of bowel wall thickening, distention, or inflammatory changes. Vascular/Lymphatic: Aortic atherosclerosis. No aneurysm. No abdominopelvic adenopathy identified. Reproductive: Status post hysterectomy. No adnexal masses. Other: No free fluid or fluid collections. Musculoskeletal: Multifocal sclerotic and lytic bone metastases are identified. When compared with previous MRI and PET-CT the T2 metastasis has increased in the interval. New lesions are identified at L1, L3 and L5. Lytic metastases involving the posterior aspect of the right iliac bone with cortical breakthrough measures 2.5 cm, image 95/2. This is also new compared with previous imaging. IMPRESSION: 1. Interval decrease in size of mass involving the superior segment of right lower lobe. 2. Interval reduction in mediastinal and left supraclavicular adenopathy. 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 The CT manifestation of these metastasis is new when compared with most recent cross-sectional imaging from 10/01/2019. 3. New small to moderate pericardial effusion. 4. Multi vessel coronary artery atherosclerotic calcifications. 5. Aortic atherosclerosis. Aortic Atherosclerosis (ICD10-I70.0). Electronically Signed   By: TKerby MoorsM.D.   On: 12/29/2019 14:57   CT Abdomen Pelvis W Contrast  Result Date: 12/29/2019 CLINICAL DATA:  Restaging  non-small cell lung cancer. EXAM: CT CHEST, ABDOMEN, AND PELVIS WITH CONTRAST TECHNIQUE: Multidetector CT imaging of the chest, abdomen and pelvis was performed following the standard protocol during bolus administration of intravenous  contrast. CONTRAST:  129m OMNIPAQUE IOHEXOL 300 MG/ML  SOLN COMPARISON:  CT AP 10/24/2019 and PET-CT from 09/30/2018 and MRI thoracic and lumbar spine dated 10/20/2019 FINDINGS: CT CHEST FINDINGS Cardiovascular: Mild cardiac enlargement. New small to moderate pericardial effusion is identified. This measures approximately 1.1 cm in thickness measured along the left heart border, image 43/2. Aortic atherosclerosis. Lad coronary artery atherosclerotic calcifications noted. Mediastinum/Nodes: Normal appearance of the thyroid gland. The trachea appears patent and is midline. Normal appearance of the esophagus. Interval reduction in mediastinal adenopathy. Index right paratracheal lymph node measures 7 mm, image 25/2. Previously 1.8 cm. Index subcarinal lymph node measures 1.2 cm, image 32/2. Previously 2.3 cm. Left supraclavicular node measures 0.6 cm, image number 9/2. Previously 0.8 cm. Lungs/Pleura: No pleural effusion. The lung mass involving the superior segment of right lower lobe appears decreased in size from previous exam. This measures 6.5 cm transverse and 1.9 cm AP. On the previous PET-CT this area measured 7.5 x 3.8 cm. Scattered multifocal ground-glass nodules in both lungs are again identified. Similar to previous exam. Solid nodule in the periphery of the right middle lobe measures 4 mm, image 102/6. Previously 5 mm. Musculoskeletal: There is mixed lytic and sclerotic lesion involving the T8 vertebra corresponding to known metastases as demonstrated on MR from 10/20/2019. Abnormal sclerotic metastasis are also identified involving T7 and T10 are new when compared with previous MRI and PET-CT. CT ABDOMEN PELVIS FINDINGS Hepatobiliary: No focal liver abnormality is seen.  Status post cholecystectomy. No biliary dilatation. Pancreas: Unremarkable. No pancreatic ductal dilatation or surrounding inflammatory changes. Spleen: Normal in size without focal abnormality. Adrenals/Urinary Tract: The adrenal glands are normal. No kidney mass or hydronephrosis. The urinary bladder is negative. Stomach/Bowel: Stomach is within normal limits. No evidence of bowel wall thickening, distention, or inflammatory changes. Vascular/Lymphatic: Aortic atherosclerosis. No aneurysm. No abdominopelvic adenopathy identified. Reproductive: Status post hysterectomy. No adnexal masses. Other: No free fluid or fluid collections. Musculoskeletal: Multifocal sclerotic and lytic bone metastases are identified. When compared with previous MRI and PET-CT the T2 metastasis has increased in the interval. New lesions are identified at L1, L3 and L5. Lytic metastases involving the posterior aspect of the right iliac bone with cortical breakthrough measures 2.5 cm, image 95/2. This is also new compared with previous imaging. IMPRESSION: 1. Interval decrease in size of mass involving the superior segment of right lower lobe. 2. Interval reduction in mediastinal and left supraclavicular adenopathy. 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 The CT manifestation of these metastasis is new when compared with most recent cross-sectional imaging from 10/01/2019. 3. New small to moderate pericardial effusion. 4. Multi vessel coronary artery atherosclerotic calcifications. 5. Aortic atherosclerosis. Aortic Atherosclerosis (ICD10-I70.0). Electronically Signed   By: TKerby MoorsM.D.   On: 12/29/2019 14:57   VAS [place]OWER EXTREMITY VENOUS (DVT)  Result Date: 12/24/2019  Lower Venous DVTStudy Other Indications: History of left leg DVT, worsening pain/swelling on                    anticoagulation. Assess for clot progression. Comparison Study: 11/09/19 Performing Technologist: MAbram SanderRVS   Examination Guidelines: A complete evaluation includes B-mode imaging, spectral Doppler, color Doppler, and power Doppler as needed of all accessible portions of each vessel. Bilateral testing is considered an integral part of a complete examination. Limited examinations for reoccurring indications may be performed as noted. The reflux portion of the exam is performed with the patient in reverse Trendelenburg.  +---------+---------------+---------+-----------+----------+--------------+ RIGHT  CompressibilityPhasicitySpontaneityPropertiesThrombus Aging +---------+---------------+---------+-----------+----------+--------------+ CFV      Full           Yes      Yes                                 +---------+---------------+---------+-----------+----------+--------------+ SFJ      Full                                                        +---------+---------------+---------+-----------+----------+--------------+ FV Prox  Full                                                        +---------+---------------+---------+-----------+----------+--------------+ FV Mid   Full                                                        +---------+---------------+---------+-----------+----------+--------------+ FV DistalFull                                                        +---------+---------------+---------+-----------+----------+--------------+ PFV      Full                                                        +---------+---------------+---------+-----------+----------+--------------+ POP      Full           Yes      Yes                                 +---------+---------------+---------+-----------+----------+--------------+ PTV      Full                                                        +---------+---------------+---------+-----------+----------+--------------+ PERO     Full                                                         +---------+---------------+---------+-----------+----------+--------------+   +---------+---------------+---------+-----------+----------+-----------------+ LEFT     CompressibilityPhasicitySpontaneityPropertiesThrombus Aging    +---------+---------------+---------+-----------+----------+-----------------+ CFV      Full           Yes      Yes                                    +---------+---------------+---------+-----------+----------+-----------------+  SFJ      Full                                                           +---------+---------------+---------+-----------+----------+-----------------+ FV Prox  Full                                                           +---------+---------------+---------+-----------+----------+-----------------+ FV Mid   Full                                                           +---------+---------------+---------+-----------+----------+-----------------+ FV DistalFull                                                           +---------+---------------+---------+-----------+----------+-----------------+ PFV      Full                                                           +---------+---------------+---------+-----------+----------+-----------------+ POP      Full           Yes      Yes                                    +---------+---------------+---------+-----------+----------+-----------------+ PTV      None                                         Age Indeterminate +---------+---------------+---------+-----------+----------+-----------------+ PERO     None                                         Age Indeterminate +---------+---------------+---------+-----------+----------+-----------------+     Summary: RIGHT: - There is no evidence of deep vein thrombosis in the lower extremity.  - No cystic structure found in the popliteal fossa.  LEFT: - Findings consistent with age indeterminate deep vein  thrombosis involving the left posterior tibial veins, and left peroneal veins.  *See table(s) above for measurements and observations. Electronically signed by Servando Snare MD on 12/24/2019 at 4:16:41 PM.    Final     ASSESSMENT & PLAN Virginia [name] 64 y.o. female with medical history significant for metastatic adenocarcinoma of the lung presents for a follow up visit. 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  Today Virginia [name] reports that nausea is her primary concern.  It appears as though she is not taking Zofran frequently as she is concerned that this is causing her to have some somnolence.  She is only taking 4 mg every 12 hours at this time.  We discussed alternative regimens to help with nausea including nightly olanzapine with Compazine for breakthrough.  We also talked about increasing the dose of Zofran to 8 mg if necessary with dosing every 8 hours.  She and her husband voiced understanding of this regimen and noted they would reach out to [name] in the event that this was not adequate to control her nausea.  She has had 10 pounds of weight loss likely related to decreased PO intake from nausea.   Previously we discussed capmatinib and the expected side effect from this medication. Capmatinib can cause peripheral edema, diarrhea, increased liver enzymes, drop in blood counts, fatigue, increase in serum creatinine, and rarely acute pancreatitis or acute renal failure. We also discussed the efficacy of the medication by discussing the phase II multicenter multicohort GEOMETRY mono-1 trial, which showed the overall response rate was 68% (95% confidence interval [CI] = 48%--84%), with a complete response in 4%, and the median response duration was 12.6 months (95% CI = 5.5-25.3 months). We also noted that chemotherapy/immunotherapy can be administered after progression or intolerance of capmatinib. The study showed this medication was most effective when used  in the first line setting (Among previously treated patients, the overall response rate (all partial responses) was 41% (95% CI = 29%-53%), and the median response duration was 9.7 months (95% CI = 5.5-13.0 months).   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 Started on capmatinib 462m BID on 11/04/2019.  --hold plans to start Carbo/Pem/Pem. This can be started if patient has progression or intolerance on oral therapy.  --patient will be followed initially every 2 weeks with labs and clinic visit. These can be extended to monthly if the patient's labs and clinical status are stable --plan for repeat CT C/A/P in July 2021 (3 months from last scan in April 2021) --supportive therapy as listed below.  --RTC q 2 weeks for continued monitoring.    #Nausea/Vomiting --will provide patient with Zofran 4-842mq8h PRN and compazine 1056m6H for breakthrough PRN --will prescribed olanzpaine 2.5mg54m QHS to help with nausea --can consider Reglan if the above regimen does not work as it may be gastric motility issues related to her pain medication causing her symptoms.  --give IVF 1 L NS today in clinic --continue to monitor   #Hypokalemia, stable --likely 2/2 to decreased PO intake, hypovolemia, and BP medications --given her decreased BP would recommend holding losartan and hydrochlorothiazide --continue to monitor  #Pain Control, improving --continue oxycodone 5mg 65m PRN. Can increase dose to 10mg 35mif pain is severe --continue to take with Senokot to prevent opioid induced constipation. Miralax PRN   #Supportive Therapy --continue EMLA cream with port --nausea as above   #Headache #Possible Concussion --appreciate the assistance of Dr. VaslowMickeal Skinnereating her brain metastasis and symptoms. --radiation therapy to the brain completed on 11/16/2019.  --continue to monitor   No orders of the  defined types were placed in this encounter.  All questions were answered. The patient knows to call the clinic with any problems, questions or concerns.  A total of more than 30 minutes were spent on  this encounter and over half of that time was spent on counseling and coordination of care as outlined above.   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 2019 ASCO Annual Meeting. Abstract 9004. Presented February 10, 2018.  --the overall response rate was 68% (95% confidence interval [CI] = 48%--84%), with a complete response in 4%, and the median response duration was 12.6 months (95% CI = 5.5-25.3 months).

## 2020-01-13 ENCOUNTER — Inpatient Hospital Stay: Payer: BC Managed Care – PPO

## 2020-01-13 ENCOUNTER — Other Ambulatory Visit: Payer: Self-pay

## 2020-01-13 ENCOUNTER — Encounter: Payer: Self-pay | Admitting: Hematology and Oncology

## 2020-01-13 ENCOUNTER — Inpatient Hospital Stay: Payer: BC Managed Care – PPO | Attending: Hematology and Oncology | Admitting: Hematology and Oncology

## 2020-01-13 VITALS — BP 121/91 | HR 84 | Temp 98.7°F | Resp 18 | Ht 62.0 in | Wt 138.9 lb

## 2020-01-13 DIAGNOSIS — R112 Nausea with vomiting, unspecified: Secondary | ICD-10-CM | POA: Diagnosis not present

## 2020-01-13 DIAGNOSIS — I313 Pericardial effusion (noninflammatory): Secondary | ICD-10-CM | POA: Diagnosis not present

## 2020-01-13 DIAGNOSIS — Z7901 Long term (current) use of anticoagulants: Secondary | ICD-10-CM | POA: Insufficient documentation

## 2020-01-13 DIAGNOSIS — C7801 Secondary malignant neoplasm of right lung: Secondary | ICD-10-CM

## 2020-01-13 DIAGNOSIS — Z86718 Personal history of other venous thrombosis and embolism: Secondary | ICD-10-CM | POA: Diagnosis not present

## 2020-01-13 DIAGNOSIS — R400 Somnolence: Secondary | ICD-10-CM | POA: Insufficient documentation

## 2020-01-13 DIAGNOSIS — R110 Nausea: Secondary | ICD-10-CM | POA: Diagnosis not present

## 2020-01-13 DIAGNOSIS — I700 Atherosclerosis of aorta: Secondary | ICD-10-CM | POA: Diagnosis not present

## 2020-01-13 DIAGNOSIS — I82452 Acute embolism and thrombosis of left peroneal vein: Secondary | ICD-10-CM

## 2020-01-13 DIAGNOSIS — M7989 Other specified soft tissue disorders: Secondary | ICD-10-CM | POA: Insufficient documentation

## 2020-01-13 DIAGNOSIS — C7931 Secondary malignant neoplasm of brain: Secondary | ICD-10-CM | POA: Diagnosis not present

## 2020-01-13 DIAGNOSIS — Z79899 Other long term (current) drug therapy: Secondary | ICD-10-CM | POA: Diagnosis not present

## 2020-01-13 DIAGNOSIS — C3431 Malignant neoplasm of lower lobe, right bronchus or lung: Secondary | ICD-10-CM | POA: Diagnosis not present

## 2020-01-13 DIAGNOSIS — E876 Hypokalemia: Secondary | ICD-10-CM | POA: Diagnosis not present

## 2020-01-13 DIAGNOSIS — R590 Localized enlarged lymph nodes: Secondary | ICD-10-CM | POA: Diagnosis not present

## 2020-01-13 DIAGNOSIS — C7951 Secondary malignant neoplasm of bone: Secondary | ICD-10-CM | POA: Diagnosis not present

## 2020-01-13 DIAGNOSIS — M25559 Pain in unspecified hip: Secondary | ICD-10-CM | POA: Insufficient documentation

## 2020-01-13 DIAGNOSIS — Z9049 Acquired absence of other specified parts of digestive tract: Secondary | ICD-10-CM | POA: Diagnosis not present

## 2020-01-13 DIAGNOSIS — R519 Headache, unspecified: Secondary | ICD-10-CM | POA: Diagnosis not present

## 2020-01-13 DIAGNOSIS — Z95828 Presence of other vascular implants and grafts: Secondary | ICD-10-CM

## 2020-01-13 DIAGNOSIS — M25519 Pain in unspecified shoulder: Secondary | ICD-10-CM | POA: Insufficient documentation

## 2020-01-13 LAB — CBC WITH DIFFERENTIAL (CANCER CENTER ONLY)
Abs Immature Granulocytes: 0.04 10*3/uL (ref 0.00–0.07)
Basophils Absolute: 0 10*3/uL (ref 0.0–0.1)
Basophils Relative: 0 %
Eosinophils Absolute: 0.1 10*3/uL (ref 0.0–0.5)
Eosinophils Relative: 1 %
HCT: 36 % (ref 36.0–46.0)
Hemoglobin: 12.2 g/dL (ref 12.0–15.0)
Immature Granulocytes: 0 %
Lymphocytes Relative: 15 %
Lymphs Abs: 1.3 10*3/uL (ref 0.7–4.0)
MCH: 31.1 pg (ref 26.0–34.0)
MCHC: 33.9 g/dL (ref 30.0–36.0)
MCV: 91.8 fL (ref 80.0–100.0)
Monocytes Absolute: 0.7 10*3/uL (ref 0.1–1.0)
Monocytes Relative: 7 %
Neutro Abs: 6.9 10*3/uL (ref 1.7–7.7)
Neutrophils Relative %: 77 %
Platelet Count: 263 10*3/uL (ref 150–400)
RBC: 3.92 MIL/uL (ref 3.87–5.11)
RDW: 13.8 % (ref 11.5–15.5)
WBC Count: 9 10*3/uL (ref 4.0–10.5)
nRBC: 0 % (ref 0.0–0.2)

## 2020-01-13 LAB — CMP (CANCER CENTER ONLY)
ALT: 17 U/L (ref 0–44)
AST: 13 U/L — ABNORMAL LOW (ref 15–41)
Albumin: 2.9 g/dL — ABNORMAL LOW (ref 3.5–5.0)
Alkaline Phosphatase: 74 U/L (ref 38–126)
Anion gap: 5 (ref 5–15)
BUN: 9 mg/dL (ref 8–23)
CO2: 24 mmol/L (ref 22–32)
Calcium: 8.5 mg/dL — ABNORMAL LOW (ref 8.9–10.3)
Chloride: 107 mmol/L (ref 98–111)
Creatinine: 0.7 mg/dL (ref 0.44–1.00)
GFR, Est AFR Am: >60 mL/min (ref >60)
GFR, Estimated: >60 mL/min (ref >60)
Glucose, Bld: 129 mg/dL — ABNORMAL HIGH (ref 70–99)
Potassium: 3.8 mmol/L (ref 3.5–5.1)
Sodium: 136 mmol/L (ref 135–145)
Total Bilirubin: 0.6 mg/dL (ref 0.3–1.2)
Total Protein: 5.5 g/dL — ABNORMAL LOW (ref 6.5–8.1)

## 2020-01-13 MED ORDER — SODIUM CHLORIDE 0.9% FLUSH
10.0000 mL | INTRAVENOUS | Status: DC | PRN
  Administered 2020-01-13: 10 mL via INTRAVENOUS
  Filled 2020-01-13: qty 10

## 2020-01-13 MED ORDER — HEPARIN SOD (PORK) LOCK FLUSH 100 UNIT/ML IV SOLN
500.0000 [IU] | Freq: Once | INTRAVENOUS | Status: AC
  Administered 2020-01-13: 500 [IU] via INTRAVENOUS
  Filled 2020-01-13: qty 5

## 2020-01-13 MED ORDER — OXYCODONE HCL 5 MG PO TABS: 5.0000 mg | ORAL_TABLET | ORAL | 0 refills | Status: DC | PRN

## 2020-01-13 MED ORDER — SODIUM CHLORIDE 0.9 % IV SOLN
Freq: Once | INTRAVENOUS | Status: AC
  Filled 2020-01-13: qty 250

## 2020-01-13 MED ORDER — ONDANSETRON HCL 4 MG/2ML IJ SOLN
8.0000 mg | Freq: Once | INTRAMUSCULAR | Status: AC
  Administered 2020-01-13: 8 mg via INTRAVENOUS

## 2020-01-13 MED ORDER — PROCHLORPERAZINE MALEATE 10 MG PO TABS: 10.0000 mg | ORAL_TABLET | Freq: Four times a day (QID) | ORAL | 0 refills | Status: DC | PRN

## 2020-01-13 MED ORDER — ONDANSETRON HCL 4 MG/2ML IJ SOLN
INTRAMUSCULAR | Status: AC
  Filled 2020-01-13: qty 4

## 2020-01-13 MED ORDER — SODIUM CHLORIDE 0.9% FLUSH
10.0000 mL | Freq: Once | INTRAVENOUS | Status: AC
  Administered 2020-01-13: 10 mL via INTRAVENOUS
  Filled 2020-01-13: qty 10

## 2020-01-13 MED ORDER — ONDANSETRON HCL 4 MG PO TABS: 4.0000 mg | ORAL_TABLET | Freq: Three times a day (TID) | ORAL | 0 refills | Status: DC | PRN

## 2020-01-13 MED ORDER — OLANZAPINE 2.5 MG PO TABS: 2.5000 mg | ORAL_TABLET | Freq: Every day | ORAL | 1 refills | Status: DC

## 2020-01-13 MED FILL — PROCHLORPERAZINE 10 MG TAB: 10 | 7 days supply | Qty: 30 | Fill #0

## 2020-01-13 MED FILL — ONDANSETRON HCL 4 MG TABS: 4 | 6 days supply | Qty: 20 | Fill #0

## 2020-01-13 MED FILL — OLANZapine 2.5 MG TABS: 2.5 | 14 days supply | Qty: 14 | Fill #0

## 2020-01-13 MED FILL — oxyCODONE HCL 5 MG TABS: 5 | 15 days supply | Qty: 180 | Fill #0

## 2020-01-13 NOTE — Patient Instructions (Signed)

## 2020-01-15 ENCOUNTER — Other Ambulatory Visit: Payer: Self-pay | Admitting: *Deleted

## 2020-01-15 ENCOUNTER — Telehealth: Payer: Self-pay | Admitting: *Deleted

## 2020-01-15 MED ORDER — GLYCERIN (ADULT) 2 G RE SUPP: 1.0000 | RECTAL | 0 refills | Status: DC | PRN

## 2020-01-15 NOTE — Telephone Encounter (Signed)
Received call from pt's husband, Virginia [name].  He states Virginia [name] is having problems with constipation and nausea. Reviewed medications with him. Virginia [name] has not started the Olanzepine yet that was ordered for nausea on Wednesday. Advised that she should/needs to start it to see if it will help with her nausea. She has not had a good BM since Monday, 01/11/20.  Advised to increase her Senna S to 3 in the morning and 3 at night. Advised to drink 1/2 bottle of magnesium citrate today.  Encouraged to keep increasing fluids, try the compazine every 6 hours and use the zofran as that may be contributing to the constipation.  Prescription sent in for glycerin suppository as additional assist with hard stool in rectum. Will discuss the above with Dr. Lorenso Courier.

## 2020-01-18 ENCOUNTER — Telehealth: Payer: Self-pay | Admitting: *Deleted

## 2020-01-18 NOTE — Telephone Encounter (Signed)
TCT O[place], pt's husband.  Spoke with to see how pt was over the weekend.  He reports that the Mag Citrate worked and she had a good BM. She is taking Senna s and miralax 2 x a day and is having a bowel movement every day.  She is taking  Compazine every 6 hours with some improvement in nausea.  She has not started the Olanzapine yet but she will not say why.    Strongly encourage her to take this so we know if her nausea responds to this medication.  Virginia [name] will address this with her again. Again advised to take at night. If effective she may not need to take the compazine so often.  She is not taking the Zofran at this time as we discussed  last week. Advised I would call back later in the week to check on her. Virginia [name] voiced appreciation for the calls.

## 2020-01-19 ENCOUNTER — Other Ambulatory Visit: Payer: Self-pay | Admitting: Hematology and Oncology

## 2020-01-20 ENCOUNTER — Other Ambulatory Visit: Payer: Self-pay | Admitting: Hematology and Oncology

## 2020-01-21 ENCOUNTER — Other Ambulatory Visit: Payer: Self-pay | Admitting: *Deleted

## 2020-01-21 MED ORDER — PROCHLORPERAZINE MALEATE 10 MG PO TABS: 10.0000 mg | ORAL_TABLET | Freq: Four times a day (QID) | ORAL | 0 refills | Status: DC | PRN

## 2020-01-25 ENCOUNTER — Other Ambulatory Visit: Payer: Self-pay | Admitting: *Deleted

## 2020-01-25 MED ORDER — PROCHLORPERAZINE MALEATE 10 MG PO TABS: 10.0000 mg | ORAL_TABLET | Freq: Four times a day (QID) | ORAL | 2 refills | Status: DC | PRN

## 2020-01-25 NOTE — Telephone Encounter (Signed)
Received call from pt's husband, [name]. He called to ask for additional refill on pt's Compazine as pt is taking it every 6 hours ATC. Advised that a refill would be called in.  [name] also asked about the refill for the [place]. Advised that Dr. Lorenso Courier will be talking to them about switching medications as Noami has had so many side effects with the Tabrecta. So Hien will have a break for the next 5 days. [name] voiced understanding and will share the information with Mardene Celeste.  He is aware of her upcoming appts.

## 2020-01-28 ENCOUNTER — Other Ambulatory Visit: Payer: Self-pay | Admitting: Hematology and Oncology

## 2020-01-28 DIAGNOSIS — C7801 Secondary malignant neoplasm of right lung: Secondary | ICD-10-CM

## 2020-01-29 ENCOUNTER — Inpatient Hospital Stay: Payer: BC Managed Care – PPO

## 2020-01-29 ENCOUNTER — Inpatient Hospital Stay: Payer: BC Managed Care – PPO | Admitting: Hematology and Oncology

## 2020-01-29 ENCOUNTER — Other Ambulatory Visit: Payer: Self-pay | Admitting: Hematology and Oncology

## 2020-01-29 ENCOUNTER — Other Ambulatory Visit: Payer: Self-pay

## 2020-01-29 VITALS — BP 140/90 | HR 85 | Temp 97.7°F | Resp 17 | Ht 62.0 in | Wt 135.2 lb

## 2020-01-29 DIAGNOSIS — R400 Somnolence: Secondary | ICD-10-CM | POA: Diagnosis not present

## 2020-01-29 DIAGNOSIS — C7951 Secondary malignant neoplasm of bone: Secondary | ICD-10-CM | POA: Diagnosis not present

## 2020-01-29 DIAGNOSIS — C7801 Secondary malignant neoplasm of right lung: Secondary | ICD-10-CM | POA: Diagnosis not present

## 2020-01-29 DIAGNOSIS — C7931 Secondary malignant neoplasm of brain: Secondary | ICD-10-CM | POA: Diagnosis not present

## 2020-01-29 DIAGNOSIS — M25559 Pain in unspecified hip: Secondary | ICD-10-CM | POA: Diagnosis not present

## 2020-01-29 DIAGNOSIS — R590 Localized enlarged lymph nodes: Secondary | ICD-10-CM | POA: Diagnosis not present

## 2020-01-29 DIAGNOSIS — E876 Hypokalemia: Secondary | ICD-10-CM | POA: Diagnosis not present

## 2020-01-29 DIAGNOSIS — M25519 Pain in unspecified shoulder: Secondary | ICD-10-CM | POA: Diagnosis not present

## 2020-01-29 DIAGNOSIS — C3491 Malignant neoplasm of unspecified part of right bronchus or lung: Secondary | ICD-10-CM

## 2020-01-29 DIAGNOSIS — I313 Pericardial effusion (noninflammatory): Secondary | ICD-10-CM | POA: Diagnosis not present

## 2020-01-29 DIAGNOSIS — Z86718 Personal history of other venous thrombosis and embolism: Secondary | ICD-10-CM | POA: Diagnosis not present

## 2020-01-29 DIAGNOSIS — M7989 Other specified soft tissue disorders: Secondary | ICD-10-CM | POA: Diagnosis not present

## 2020-01-29 DIAGNOSIS — I700 Atherosclerosis of aorta: Secondary | ICD-10-CM | POA: Diagnosis not present

## 2020-01-29 DIAGNOSIS — C3431 Malignant neoplasm of lower lobe, right bronchus or lung: Secondary | ICD-10-CM | POA: Diagnosis not present

## 2020-01-29 DIAGNOSIS — R112 Nausea with vomiting, unspecified: Secondary | ICD-10-CM | POA: Diagnosis not present

## 2020-01-29 DIAGNOSIS — R519 Headache, unspecified: Secondary | ICD-10-CM | POA: Diagnosis not present

## 2020-01-29 DIAGNOSIS — Z79899 Other long term (current) drug therapy: Secondary | ICD-10-CM | POA: Diagnosis not present

## 2020-01-29 DIAGNOSIS — Z7901 Long term (current) use of anticoagulants: Secondary | ICD-10-CM | POA: Diagnosis not present

## 2020-01-29 LAB — CMP (CANCER CENTER ONLY)
ALT: 17 U/L (ref 0–44)
AST: 10 U/L — ABNORMAL LOW (ref 15–41)
Albumin: 3.1 g/dL — ABNORMAL LOW (ref 3.5–5.0)
Alkaline Phosphatase: 66 U/L (ref 38–126)
Anion gap: 7 (ref 5–15)
BUN: 9 mg/dL (ref 8–23)
CO2: 25 mmol/L (ref 22–32)
Calcium: 8.6 mg/dL — ABNORMAL LOW (ref 8.9–10.3)
Chloride: 111 mmol/L (ref 98–111)
Creatinine: 0.68 mg/dL (ref 0.44–1.00)
GFR, Est AFR Am: >60 mL/min (ref >60)
GFR, Estimated: >60 mL/min (ref >60)
Glucose, Bld: 102 mg/dL — ABNORMAL HIGH (ref 70–99)
Potassium: 3.4 mmol/L — ABNORMAL LOW (ref 3.5–5.1)
Sodium: 143 mmol/L (ref 135–145)
Total Bilirubin: 0.3 mg/dL (ref 0.3–1.2)
Total Protein: 5.7 g/dL — ABNORMAL LOW (ref 6.5–8.1)

## 2020-01-29 LAB — CBC WITH DIFFERENTIAL (CANCER CENTER ONLY)
Abs Immature Granulocytes: 0.01 10*3/uL (ref 0.00–0.07)
Basophils Absolute: 0 10*3/uL (ref 0.0–0.1)
Basophils Relative: 0 %
Eosinophils Absolute: 0.1 10*3/uL (ref 0.0–0.5)
Eosinophils Relative: 2 %
HCT: 36.8 % (ref 36.0–46.0)
Hemoglobin: 12.3 g/dL (ref 12.0–15.0)
Immature Granulocytes: 0 %
Lymphocytes Relative: 29 %
Lymphs Abs: 1.6 10*3/uL (ref 0.7–4.0)
MCH: 31 pg (ref 26.0–34.0)
MCHC: 33.4 g/dL (ref 30.0–36.0)
MCV: 92.7 fL (ref 80.0–100.0)
Monocytes Absolute: 0.5 10*3/uL (ref 0.1–1.0)
Monocytes Relative: 10 %
Neutro Abs: 3.2 10*3/uL (ref 1.7–7.7)
Neutrophils Relative %: 59 %
Platelet Count: 264 10*3/uL (ref 150–400)
RBC: 3.97 MIL/uL (ref 3.87–5.11)
RDW: 13.1 % (ref 11.5–15.5)
WBC Count: 5.5 10*3/uL (ref 4.0–10.5)
nRBC: 0 % (ref 0.0–0.2)

## 2020-01-29 LAB — LACTATE DEHYDROGENASE: LDH: 136 U/L (ref 98–192)

## 2020-01-29 MED ORDER — CAPMATINIB HCL 200 MG PO TABS: ORAL_TABLET | ORAL | 1 refills | Status: DC

## 2020-01-29 MED FILL — TABRECTA 200 MG TABS: 200 | 14 days supply | Qty: 56 | Fill #0

## 2020-01-29 MED FILL — PROCHLORPERAZINE 10 MG TAB: 10 | 7 days supply | Qty: 30 | Fill #0

## 2020-01-29 NOTE — Progress Notes (Signed)
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 CT of the head showed showed concerning for metastatic disease involving the left cerebellum and right occipital lobe.  2) 09/30/2019: MRI brain confirms mass within the left cerebellum measures 1.6 x 1.3 x 1.5 cm as well as at least 8 metastatic lesions within the supratentorial and infratentorial brain as outlined 3) 10/01/2019: PET CT scan revealed hypermetabolic infrahilar right lower lobe mass with hypermetabolic nodal metastases in the right hilum, mediastinum, right supraclavicular region, left axilla and lower left neck. 4) 10/08/2019: US guided biopsy of the lymph nodes confirms poorly differentiated non-small cell carcinoma. Immunohistochemistry confirms adenocarcinoma of lung primary. PD-L1 and NGS later revealed a MET Exon 14 mutation and TPS of 90%.  5) 10/12/2019: Establish care with Dr. Lorenso Courier  6) 11/04/2019: Day 1 of capmatinib '400mg'$  BID 7) 11/09/2019: presented to Rad/Onc with a DVT in LLE. Seen in symptom management clinic and started on apixaban.  8) 12/29/2019: CT C/A/P showed interval decrease in size of mass involving the superior segment of right lower lobe and reduction in mediastinal and left supraclavicular adenopathy though some small spinal lesions were noted in the interim between the two sets of scans  Interval History:  Virginia [name] 64 y.o. female with medical history significant for metastatic adenocarcinoma of the lung presents for a follow up visit. The patient's last visit was on 01/13/2020 at which time she  was continued on capmatinib therapy. In the interim since the last visit she has had imaging on 12/29/2019 which showed overall improvement in the burden of disease, though some small spinal lesions were noted in the interim between the two sets of scans.    On exam today Virginia [name] notes that she had to stop taking the Tabrecta on Monday because the nausea was becoming too severe.  She notes that in the interim she has had little issues with nausea, but has continued to take Compazine.  While on Tabrecta she reports taking Compazine every 6 hours on the nose.  Her husband endorses that she continues to have poor p.o. intake.  The patient notes that today the worst symptom that she is having is continued back and hip pain.  Continue to take the oxycodone 5 to 10 mg every 4 hours as needed in order to help control this pain.  On further discussing this he notes denies having any issues with fevers, chills, sweats, diarrhea, or constipation.  She notes that her breathing has been good and she has not had any issues with cough, shortness of breath, or chest pain.  A full 10 point ROS is listed below.  MEDICAL HISTORY:  Past Medical History:  Diagnosis Date  . Anxiety   . Dyspnea   . GERD (gastroesophageal reflux disease)   . Hypercholesterolemia    per pt, she does not have elevated lipids  . Hypertension   . met lung ca dx'd 09/2019   mets to spine, hip and brain  . Tobacco abuse     SURGICAL HISTORY: Past Surgical History:  Procedure Laterality Date  . ABDOMINAL HYSTERECTOMY     partial/ left ovaries  .  CHOLECYSTECTOMY    . DILATION AND CURETTAGE OF UTERUS    . IR IMAGING GUIDED PORT INSERTION  10/23/2019  . LIPOMA EXCISION  2018   removed under left breast and right thigh.  . TUBAL LIGATION      ALLERGIES:  has No Known Allergies.  MEDICATIONS:  Current Outpatient Medications  Medication Sig Dispense Refill  . acetaminophen (TYLENOL) 500 MG tablet Take 500-1,000 mg by mouth every  6 (six) hours as needed (for pain.).    Marland Kitchen albuterol (VENTOLIN HFA) 108 (90 Base) MCG/ACT inhaler Inhale 2 puffs into the lungs every 6 (six) hours as needed for wheezing or shortness of breath. 6.7 g 11  . ALPRAZolam (XANAX) 0.25 MG tablet TAKE 1 TABLET BY MOUTH TWICE DAILY AS NEEDED FOR ANXIETY 60 tablet 5  . apixaban (ELIQUIS) 5 MG TABS tablet 1 tablet ('5mg'$ ) twice daily 60 tablet 2  . Budeson-Glycopyrrol-Formoterol (BREZTRI AEROSPHERE) 160-9-4.8 MCG/ACT AERO Inhale 2 puffs into the lungs 2 (two) times daily.    . capmatinib (TABRECTA) 200 MG tablet TAKE 2 TABLETS (400 MG TOTAL) BY MOUTH 2 (TWO) TIMES DAILY. 56 tablet 1  . Cyanocobalamin (VITAMIN B12 PO) Take by mouth daily.    . furosemide (LASIX) 20 MG tablet 20 mg once daily for 3 days, then stop. May repeat if edema recurs. (Patient not taking: Reported on 01/29/2020) 30 tablet 1  . glycerin adult 2 g suppository Place 1 suppository rectally as needed for constipation. 12 suppository 0  . lidocaine-prilocaine (EMLA) cream Apply 1 application topically as needed. Apply to port as needed. 30 g 0  . Multiple Vitamins-Minerals (AIRBORNE PO) Take by mouth daily.    Marland Kitchen OLANZapine (ZYPREXA) 2.5 MG tablet Take 1 tablet (2.5 mg total) by mouth at bedtime. (Patient not taking: Reported on 01/29/2020) 14 tablet 1  . ondansetron (ZOFRAN) 4 MG tablet Take 1 tablet (4 mg total) by mouth every 8 (eight) hours as needed for nausea or vomiting. (Patient not taking: Reported on 01/29/2020) 20 tablet 0  . OVER THE COUNTER MEDICATION Apply 1 application topically daily as needed (shoulder pain.). [place] Gel OTC    . oxyCODONE (OXY IR/ROXICODONE) 5 MG immediate release tablet Take 1-2 tablets (5-10 mg total) by mouth every 4 (four) hours as needed for severe pain or breakthrough pain. 180 tablet 0  . pantoprazole (PROTONIX) 40 MG tablet TAKE 1 TABLET(40 MG) BY MOUTH DAILY 90 tablet 3  . polyethylene glycol (MIRALAX / GLYCOLAX) 17 g packet Take 17 g by mouth daily.    .  potassium chloride (KLOR-CON) 20 MEQ packet Take 20 mEq by mouth 2 (two) times daily. (Patient not taking: Reported on 12/04/2019) 20 packet 0  . prochlorperazine (COMPAZINE) 10 MG tablet TAKE 1 TABLET BY MOUTH EVERY 6 HOURS AS NEEDED FOR NAUSEA OR VOMITING 30 tablet 0  . prochlorperazine (COMPAZINE) 10 MG tablet Take 1 tablet (10 mg total) by mouth every 6 (six) hours as needed for nausea or vomiting. 60 tablet 2  . sennosides-docusate sodium (SENOKOT-S) 8.6-50 MG tablet Take 1 tablet by mouth daily. 2 in the morning and 2 at night. Adjust as needed    . sucralfate (CARAFATE) 1 g tablet Dissolve 1 tablet in 10 mL H20 and swallow 30 min prior to meals and bedtime. (Patient not taking: Reported on 01/13/2020) 50 tablet 3  . Vitamin D3 (VITAMIN D) 25 MCG tablet Take 2,000 Units by mouth daily.     No current facility-administered medications for this visit.  REVIEW OF SYSTEMS:   Constitutional: ( - ) fevers, ( - )  chills , ( - ) night sweats Eyes: ( - ) blurriness of vision, ( - ) double vision, ( - ) watery eyes Ears, nose, mouth, throat, and face: ( - ) mucositis, ( - ) sore throat Respiratory: ( + ) cough, ( - ) dyspnea, ( - ) wheezes Cardiovascular: ( - ) palpitation, ( - ) chest discomfort, ( - ) lower extremity swelling Gastrointestinal:  ( - ) nausea, ( - ) heartburn, ( - ) change in bowel habits Skin: ( - ) abnormal skin rashes Lymphatics: ( - ) new lymphadenopathy, ( - ) easy bruising Neurological: ( - ) numbness, ( - ) tingling, ( - ) new weaknesses Behavioral/Psych: ( - ) mood change, ( - ) new changes  All other systems were reviewed with the patient and are negative.  PHYSICAL EXAMINATION: ECOG PERFORMANCE STATUS: 1 - Symptomatic but completely ambulatory  Vitals:   01/29/20 1120  BP: 140/90  Pulse: 85  Resp: 17  Temp: 97.7 F (36.5 C)  SpO2: 100%   Filed Weights   01/29/20 1120  Weight: 135 lb 3.2 oz (61.3 kg)    GENERAL: well appearing middle aged Caucasian female  in NAD  SKIN: skin color, texture, turgor are normal, no rashes or significant lesions EYES: conjunctiva are pink and non-injected, sclera clear LUNGS: clear to auscultation and percussion with normal breathing effort HEART: regular rate & rhythm and no murmurs and no lower extremity edema Musculoskeletal: no cyanosis of digits and no clubbing. Some tenderness on palpation of right leg.  PSYCH: alert & oriented x 3, fluent speech NEURO: no focal motor/sensory deficits   LABORATORY DATA:  I have reviewed the data as listed CBC Latest Ref Rng & Units 01/29/2020 01/13/2020 12/23/2019  WBC 4.0 - 10.5 K/uL 5.5 9.0 4.8  Hemoglobin 12.0 - 15.0 g/dL 12.3 12.2 11.3(L)  Hematocrit 36.0 - 46.0 % 36.8 36.0 33.7(L)  Platelets 150 - 400 K/uL 264 263 270    CMP Latest Ref Rng & Units 01/29/2020 01/13/2020 12/23/2019  Glucose 70 - 99 mg/dL 102(H) 129(H) 110(H)  BUN 8 - 23 mg/dL 9 9 7(L)  Creatinine 0.44 - 1.00 mg/dL 0.68 0.70 0.70  Sodium 135 - 145 mmol/L 143 136 141  Potassium 3.5 - 5.1 mmol/L 3.4(L) 3.8 3.5  Chloride 98 - 111 mmol/L 111 107 107  CO2 22 - 32 mmol/L '25 24 26  '$ Calcium 8.9 - 10.3 mg/dL 8.6(L) 8.5(L) 8.4(L)  Total Protein 6.5 - 8.1 g/dL 5.7(L) 5.5(L) 5.5(L)  Total Bilirubin 0.3 - 1.2 mg/dL 0.3 0.6 0.6  Alkaline Phos 38 - 126 U/L 66 74 84  AST 15 - 41 U/L 10(L) 13(L) 25  ALT 0 - 44 U/L 17 17 52(H)     RADIOGRAPHIC STUDIES:  No results found.  ASSESSMENT & PLAN Virginia [name] 64 y.o. female with medical history significant for metastatic adenocarcinoma of the lung presents for a follow up visit. 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   Today Virginia [name] reports that pain is her primary concern.  She notes that her back and hip pain are progressively worsening and that these of the major limitations to her daily activity.  Previously during her last visit on 01/13/2020 the patient notes that her pain was 0 out of 10 while sitting stationary,  however this is unfortunately changed.  She mostly points  to a high area in her spine between her shoulder blades as the primary source of her pain.  She has been taking the oxycodone 5 mg every 4 hours for pain control, but this has been of limited help.  Additionally she has been on a [place] holiday since Monday and notes that she has had a decrease in her nausea, though she does continue to take Compazine for nausea control.  Previously we discussed capmatinib and the expected side effect from this medication. Capmatinib can cause peripheral edema, diarrhea, increased liver enzymes, drop in blood counts, fatigue, increase in serum creatinine, and rarely acute pancreatitis or acute renal failure. We also discussed the efficacy of the medication by discussing the phase II multicenter multicohort GEOMETRY mono-1 trial, which showed the overall response rate was 68% (95% confidence interval [CI] = 48%--84%), with a complete response in 4%, and the median response duration was 12.6 months (95% CI = 5.5-25.3 months). We also noted that chemotherapy/immunotherapy can be administered after progression or intolerance of capmatinib. The study showed this medication was most effective when used in the first line setting (Among previously treated patients, the overall response rate (all partial responses) was 41% (95% CI = 29%-53%), and the median response duration was 9.7 months (95% CI = 5.5-13.0 months).   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 Started on capmatinib '400mg'$  BID on 11/04/2019. Stopped medication on 01/25/2020 due to nausea, restarted today. --I offered numerous options including transitioning to another medication with similar activity, transitioning to immunotherapy +/- chemotherapy, or dose reduction of Tabrecta (though this option is not well studied). After discussion today the patient notes  she wants to continue Tabrecta at the current dose.  --hold plans to start Carbo/Pem/Pem. This can be started if patient has progression or intolerance on oral therapy.  --patient will be followed initially every 2 weeks with labs and clinic visit. These can be extended to monthly if the patient's labs and clinical status are stable --plan for repeat CT C/A/P in July 2021 (3 months from last scan in April 2021) --supportive therapy as listed below.  --RTC q 2 weeks for continued monitoring.    #Nausea/Vomiting --will provide patient with Zofran 4-'8mg'$  q8h PRN and compazine '10mg'$  q6H for breakthrough PRN --prescribed olanzpaine 2.'5mg'$  PO QHS to help with nausea --can consider Reglan if the above regimen does not work as it may be gastric motility issues related to her pain medication causing her symptoms.  --continue to monitor   #Hypokalemia, stable --likely 2/2 to decreased PO intake, hypovolemia, and BP medications --given her decreased BP would recommend holding losartan and hydrochlorothiazide --continue to monitor  #Pain Control, improving --continue oxycodone '5mg'$  q4H PRN. Can increase dose to '10mg'$  q6H if pain is severe --continue to take with Senokot to prevent opioid induced constipation. Miralax PRN   #Supportive Therapy --continue EMLA cream with port --zometa 4g IV q 12 weeks for bone metastasis.  --nausea as above   #Headache #Possible Concussion --appreciate the assistance of Dr. 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 --radiation therapy to the brain completed on 11/16/2019.  --continue to monitor   No orders of the defined types were placed in this encounter.  All questions were answered. The patient knows to call the clinic with any problems, questions or concerns.  A total of more than 30 minutes were spent on this encounter and over half of  that time was spent on counseling and coordination of care as outlined above.   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 2019 ASCO Annual Meeting. Abstract 9004. Presented February 10, 2018.  --the overall response rate was 68% (95% confidence interval [CI] = 48%--84%), with a complete response in 4%, and the median response duration was 12.6 months (95% CI = 5.5-25.3 months).

## 2020-01-30 MED FILL — oxyCODONE HCL 5 MG TABS: 5 | 15 days supply | Qty: 180 | Fill #0

## 2020-02-01 ENCOUNTER — Other Ambulatory Visit: Payer: Self-pay

## 2020-02-01 ENCOUNTER — Inpatient Hospital Stay: Admission: RE | Admit: 2020-02-01 | Payer: BC Managed Care – PPO | Source: Ambulatory Visit

## 2020-02-01 ENCOUNTER — Ambulatory Visit
Admission: RE | Admit: 2020-02-01 | Discharge: 2020-02-01 | Disposition: A | Discharge status: Home / Self Care | Payer: BC Managed Care – PPO | Source: Ambulatory Visit | Attending: Radiation Oncology | Admitting: Radiation Oncology

## 2020-02-01 ENCOUNTER — Telehealth: Payer: Self-pay | Admitting: Hematology and Oncology

## 2020-02-01 ENCOUNTER — Telehealth: Payer: Self-pay

## 2020-02-01 DIAGNOSIS — C7931 Secondary malignant neoplasm of brain: Secondary | ICD-10-CM

## 2020-02-01 DIAGNOSIS — C7949 Secondary malignant neoplasm of other parts of nervous system: Secondary | ICD-10-CM

## 2020-02-01 NOTE — Telephone Encounter (Signed)
Scheduled per los. Called and spoke with patients husband. Confirmed appt

## 2020-02-01 NOTE — Telephone Encounter (Signed)
Received VM this morning from patient's husband asking for F/U appointment to go over MRI results with Dr. Isidore Moos to be moved earlier in the week (or next week), because patient wanted to go out of town this coming Thursday. Relayed message to Dr. Isidore Moos who said patient could be seen this coming Wednesday 02/03/20 at 11:30.  Returned husbands call with update, and was told that patient ended up not being able to have MRI done today. Per husband "Someone named Ariel was in patient's chart while we were at [place]. Ariel had called me about making an appointment with Dr. Lorenso Courier. Apparently staff couldn't access [name]'s chart while Ariel was in it, so they made [name] reschedule for next week. She now has an appointment for the MRI on 6/2." Asked husband if he and patient would be able to see Dr. Isidore Moos that following Friday 02/12/20 to review results. Husband stated that would be fine. No other needs identified at this time, but patient/husband have my callback number should something arise in the future.   Updated Dr. Isidore Moos and asked Margretta Sidle to chance F/U appointment date/time.

## 2020-02-02 ENCOUNTER — Other Ambulatory Visit: Payer: Self-pay | Admitting: Radiation Therapy

## 2020-02-02 MED ORDER — OXYCODONE HCL ER 15 MG PO T12A: 15.0000 mg | EXTENDED_RELEASE_TABLET | Freq: Two times a day (BID) | ORAL | 0 refills | Status: DC

## 2020-02-03 ENCOUNTER — Ambulatory Visit: Payer: BC Managed Care – PPO | Admitting: Radiation Oncology

## 2020-02-05 ENCOUNTER — Ambulatory Visit: Payer: Self-pay | Admitting: Radiation Oncology

## 2020-02-09 MED FILL — TABRECTA 200 MG TABS: 200 | 14 days supply | Qty: 56 | Fill #1

## 2020-02-09 MED FILL — OLANZapine 2.5 MG TABS: 2.5 | 14 days supply | Qty: 14 | Fill #1

## 2020-02-10 ENCOUNTER — Ambulatory Visit
Admission: RE | Admit: 2020-02-10 | Discharge: 2020-02-10 | Disposition: A | Discharge status: Home / Self Care | Payer: BC Managed Care – PPO | Source: Ambulatory Visit | Attending: Radiation Oncology | Admitting: Radiation Oncology

## 2020-02-10 ENCOUNTER — Other Ambulatory Visit: Payer: Self-pay

## 2020-02-10 DIAGNOSIS — C7931 Secondary malignant neoplasm of brain: Secondary | ICD-10-CM | POA: Diagnosis not present

## 2020-02-10 MED ORDER — GADOBENATE DIMEGLUMINE 529 MG/ML IV SOLN
13.0000 mL | Freq: Once | INTRAVENOUS | Status: AC | PRN
  Administered 2020-02-10: 13 mL via INTRAVENOUS

## 2020-02-11 NOTE — Progress Notes (Signed)
Ms. Guarnieri presents today for follow up after completing radiation to her T-spine, right ilium, and brain SRS on 11/11/2019 and to review MRI scan results from 02/10/2020  Last saw Dr. Narda Rutherford on 01/29/2020 Pain: mid/right back pain is a constant. She has to take pain mediation to manage, and often stays seated/reclined to be comfortable. Nausea: started a new antinausea medication (olanzapine) that seems to be managing nausea better (she has been able to restart chemo pill-capmatinib) Mobility: restricted by back pain. She is able to ambulate, but often has to lean on something or walk with a bent back. Headaches: patient denies (occasionally 1-2 a week)  Vitals:   02/12/20 1129  BP: 111/81  Pulse: 91  Resp: 18  Temp: 97.7 F (36.5 C)  SpO2: 99%   Wt Readings from Last 3 Encounters:  02/12/20 137 lb 3.2 oz (62.2 kg)  01/29/20 135 lb 3.2 oz (61.3 kg)  01/13/20 138 lb 14.4 oz (63 kg)

## 2020-02-12 ENCOUNTER — Other Ambulatory Visit: Payer: Self-pay

## 2020-02-12 ENCOUNTER — Ambulatory Visit
Admission: RE | Admit: 2020-02-12 | Discharge: 2020-02-12 | Disposition: A | Discharge status: Home / Self Care | Payer: BC Managed Care – PPO | Source: Ambulatory Visit | Attending: Radiation Oncology | Admitting: Radiation Oncology

## 2020-02-12 ENCOUNTER — Encounter: Payer: Self-pay | Admitting: Radiation Oncology

## 2020-02-12 VITALS — BP 111/81 | HR 91 | Temp 97.7°F | Resp 18 | Ht 62.0 in | Wt 137.2 lb

## 2020-02-12 DIAGNOSIS — C3491 Malignant neoplasm of unspecified part of right bronchus or lung: Secondary | ICD-10-CM | POA: Insufficient documentation

## 2020-02-12 DIAGNOSIS — R110 Nausea: Secondary | ICD-10-CM | POA: Insufficient documentation

## 2020-02-12 DIAGNOSIS — M549 Dorsalgia, unspecified: Secondary | ICD-10-CM | POA: Insufficient documentation

## 2020-02-12 DIAGNOSIS — Z923 Personal history of irradiation: Secondary | ICD-10-CM | POA: Diagnosis not present

## 2020-02-12 DIAGNOSIS — Z08 Encounter for follow-up examination after completed treatment for malignant neoplasm: Secondary | ICD-10-CM | POA: Diagnosis not present

## 2020-02-12 DIAGNOSIS — C7951 Secondary malignant neoplasm of bone: Secondary | ICD-10-CM | POA: Diagnosis not present

## 2020-02-12 DIAGNOSIS — Z9221 Personal history of antineoplastic chemotherapy: Secondary | ICD-10-CM | POA: Diagnosis not present

## 2020-02-12 DIAGNOSIS — C7931 Secondary malignant neoplasm of brain: Secondary | ICD-10-CM | POA: Insufficient documentation

## 2020-02-12 DIAGNOSIS — S22060A Wedge compression fracture of T7-T8 vertebra, initial encounter for closed fracture: Secondary | ICD-10-CM | POA: Diagnosis not present

## 2020-02-12 DIAGNOSIS — Z7901 Long term (current) use of anticoagulants: Secondary | ICD-10-CM | POA: Diagnosis not present

## 2020-02-12 DIAGNOSIS — Z79899 Other long term (current) drug therapy: Secondary | ICD-10-CM | POA: Insufficient documentation

## 2020-02-15 ENCOUNTER — Encounter: Payer: Self-pay | Admitting: Radiation Oncology

## 2020-02-15 NOTE — Progress Notes (Signed)
Radiation Oncology         [phone] ________________________________  Name: SHAQUERA ANSLEY MRN: [id number]  Date: 02/12/2020  DOB: Dec 23, 1955  Follow-Up Visit Note in person Outpatient  CC: Elby Showers, MD  Ventura Sellers, MD  Diagnosis and Prior Radiotherapy:    ICD-10-CM   1. Brain metastases ([place])  C79.31   2. Bone metastases ([place])  C79.51     Radiation Treatment Dates: 10/28/2019 through 11/11/2019  Site Technique Total Dose (Gy) Dose per Fx (Gy) Completed Fx Beam Energies  Ilium, Right: Pelvis_Rt 3D 20/20 4 5/5 10X, 15X  Brain: Brain_SRS IMRT 20/20 20 1/1 6XFFF  Thoracic Spine: Spine_T 3D 30/30 3 10/10 10X, 15X    CHIEF COMPLAINT: Here for follow-up and surveillance of metastastic cancer  Narrative:      Ms. Cocker presents today for follow up after completing radiation to her T-spine, right ilium, and brain SRS on 11/11/2019 and to review MRI scan results from 02/10/2020  Last saw Dr. Narda Rutherford on 01/29/2020 Pain: mid/right back pain is a constant. She has to take pain mediation to manage, and often stays seated/reclined to be comfortable. Nausea: started a new antinausea medication (olanzapine) that seems to be managing nausea better (she has been able to restart chemo pill-capmatinib) Mobility: restricted by back pain. She is able to ambulate, but often has to lean on something or walk with a bent back. Headaches: patient denies (occasionally 1-2 a week)  Vitals:   02/12/20 1129  BP: 111/81  Pulse: 91  Resp: 18  Temp: 97.7 F (36.5 C)  SpO2: 99%   Wt Readings from Last 3 Encounters:  02/12/20 137 lb 3.2 oz (62.2 kg)  01/29/20 135 lb 3.2 oz (61.3 kg)  01/13/20 138 lb 14.4 oz (63 kg)    ALLERGIES:  has No Known Allergies.  Meds: Current Outpatient Medications  Medication Sig Dispense Refill  . acetaminophen (TYLENOL) 500 MG tablet Take 500-1,000 mg by mouth every 6 (six) hours as needed (for pain.).    Marland Kitchen albuterol (VENTOLIN HFA) 108 (90 Base)  MCG/ACT inhaler Inhale 2 puffs into the lungs every 6 (six) hours as needed for wheezing or shortness of breath. 6.7 g 11  . ALPRAZolam (XANAX) 0.25 MG tablet TAKE 1 TABLET BY MOUTH TWICE DAILY AS NEEDED FOR ANXIETY 60 tablet 5  . apixaban (ELIQUIS) 5 MG TABS tablet 1 tablet (5mg ) twice daily 60 tablet 2  . Budeson-Glycopyrrol-Formoterol (BREZTRI AEROSPHERE) 160-9-4.8 MCG/ACT AERO Inhale 2 puffs into the lungs 2 (two) times daily.    . capmatinib (TABRECTA) 200 MG tablet TAKE 2 TABLETS (400 MG TOTAL) BY MOUTH 2 (TWO) TIMES DAILY. 56 tablet 1  . Cyanocobalamin (VITAMIN B12 PO) Take by mouth daily.    . furosemide (LASIX) 20 MG tablet 20 mg once daily for 3 days, then stop. May repeat if edema recurs. 30 tablet 1  . glycerin adult 2 g suppository Place 1 suppository rectally as needed for constipation. 12 suppository 0  . lidocaine-prilocaine (EMLA) cream Apply 1 application topically as needed. Apply to port as needed. 30 g 0  . Multiple Vitamins-Minerals (AIRBORNE PO) Take by mouth daily.    Marland Kitchen OLANZapine (ZYPREXA) 2.5 MG tablet Take 1 tablet (2.5 mg total) by mouth at bedtime. 14 tablet 1  . ondansetron (ZOFRAN) 4 MG tablet Take 1 tablet (4 mg total) by mouth every 8 (eight) hours as needed for nausea or vomiting. 20 tablet 0  . OVER THE COUNTER MEDICATION Apply 1  application topically daily as needed (shoulder pain.). [place] Gel OTC    . oxyCODONE (OXY IR/ROXICODONE) 5 MG immediate release tablet TAKE 1 TO 2 TABLETS BY MOUTH EVERY 4 HOURS AS NEEDED FOR SEVERE OR BREAKTHOUGH PAIN 180 tablet 0  . oxyCODONE (OXYCONTIN) 15 mg 12 hr tablet Take 1 tablet (15 mg total) by mouth every 12 (twelve) hours. 28 tablet 0  . pantoprazole (PROTONIX) 40 MG tablet TAKE 1 TABLET(40 MG) BY MOUTH DAILY 90 tablet 3  . polyethylene glycol (MIRALAX / GLYCOLAX) 17 g packet Take 17 g by mouth daily.    . prochlorperazine (COMPAZINE) 10 MG tablet TAKE 1 TABLET BY MOUTH EVERY 6 HOURS AS NEEDED FOR NAUSEA OR VOMITING 30 tablet  0  . prochlorperazine (COMPAZINE) 10 MG tablet Take 1 tablet (10 mg total) by mouth every 6 (six) hours as needed for nausea or vomiting. 60 tablet 2  . sennosides-docusate sodium (SENOKOT-S) 8.6-50 MG tablet Take 1 tablet by mouth daily. 2 in the morning and 2 at night. Adjust as needed    . Vitamin D3 (VITAMIN D) 25 MCG tablet Take 2,000 Units by mouth daily.    . potassium chloride (KLOR-CON) 20 MEQ packet Take 20 mEq by mouth 2 (two) times daily. (Patient not taking: Reported on 12/04/2019) 20 packet 0  . sucralfate (CARAFATE) 1 g tablet Dissolve 1 tablet in 10 mL H20 and swallow 30 min prior to meals and bedtime. (Patient not taking: Reported on 01/13/2020) 50 tablet 3   No current facility-administered medications for this encounter.    Physical Findings: The patient is in no acute distress. Patient is alert and oriented.  height is 5\' 2"  (1.575 m) and weight is 137 lb 3.2 oz (62.2 kg). Her temperature is 97.7 F (36.5 C). Her blood pressure is 111/81 and her pulse is 91. Her respiration is 18 and oxygen saturation is 99%. .     General: No acute distress, alert and oriented MSK: Somewhat tender to palpation in right upper flank -this is where her pain is localized Neuro: No gross deficits or focalities  Lab Findings: Lab Results  Component Value Date   WBC 5.5 01/29/2020   HGB 12.3 01/29/2020   HCT 36.8 01/29/2020   MCV 92.7 01/29/2020   PLT 264 01/29/2020    Radiographic Findings: MR Brain W Wo Contrast  Result Date: 02/11/2020 CLINICAL DATA:  Metastatic brain cancer. Surveillance post [facility] treatment. EXAM: MRI HEAD WITHOUT AND WITH CONTRAST TECHNIQUE: Multiplanar, multiecho pulse sequences of the brain and surrounding structures were obtained without and with intravenous contrast. CONTRAST:  61mL MULTIHANCE GADOBENATE DIMEGLUMINE 529 MG/ML IV SOLN COMPARISON:  MRI head 10/28/2019 FINDINGS: Brain: Interval improvement in multiple metastatic deposits in the brain. Prior MRI  demonstrated 15 metastatic lesions. Many of these have resolved completely, the remaining are smaller. Small lesions in the right cerebellum are smaller. Left cerebellar necrotic lesion now measures 9 x 6 mm, previously 18 mm. Decreased edema in the left cerebellum. Additional small 3 mm enhancing lesions in the right occipital lobe and left frontal parietal lobe are marked with an arrow on axial postcontrast images. Microhemorrhage has developed in the left frontal parietal 3 mm lesion since the prior study. No other areas of intracranial hemorrhage. No new lesions. Ventricle size normal. Patchy white matter hyperintensity compatible with chronic microvascular ischemia. Negative for acute infarct. Vascular: Normal arterial flow voids Skull and upper cervical spine: Left parietal convexity enhancing lesion has improved. Right occipital bone lesion is bright on  T1 and unchanged and may be a hemangioma. Sinuses/Orbits: Mild mucosal edema paranasal sinuses. Negative orbit Other: None IMPRESSION: Marked interval improvement in metastatic disease. Previously noted 15 metastatic deposits are either smaller or have resolved completely compared with the prior MRI. No new lesions. Mild microhemorrhage in the left frontal parietal metastasis has developed since the prior MRI. Electronically Signed   By: Franchot Gallo M.D.   On: 02/11/2020 08:09    Impression/Plan:   Fortunately, the patient had an excellent response to stereotactic brain radiosurgery for 15 metastases with no new lesions.  We will continue to monitor her brain every 3 months with MRIs.  I spoken about her case with Dr. Vertell Limber, who will help follow the patient with me as his colleague Dr. Sherwood Gambler has retired.  Our team will arrange her next brain MRI in 3 months and she will see Dr. Vertell Limber at that time.  Also the patient has known bone metastases.  She had a good palliative result from the radiotherapy to her right ilium and initially to her thoracic  spine but the pain in her thoracic spine has returned.  It is mainly located in the right mid to lower back.  The location seems to correspond with a progressive compression fracture at T8.  Dr. Vertell Limber will see her relatively soon to examine her and weigh in on whether kyphoplasty could provide palliative benefit for her.  I will continue to follow her in the future, alternating with Dr. Vertell Limber.  She will also continue treatment with medical oncology for her systemic disease.  Snapshot of RT plan to spine - 30Gy/10 fractions   On date of service, in total, I spent 20 minutes on this encounter.  Patient was seen in person. _____________________________________   [name], MD

## 2020-02-16 ENCOUNTER — Encounter: Payer: Self-pay | Admitting: Radiation Therapy

## 2020-02-16 NOTE — Progress Notes (Signed)
Dr. Vertell Limber has received the referral from Dr. Isidore Moos for this patient to be seen for consideration of thoracic kyphoplasty. Kassidy, Dr. Dannielle Huh assistant, has reached out to the patient to make an appointment.   Mont Dutton R.T.(R)(T) Radiation Special Procedures Navigator

## 2020-02-19 ENCOUNTER — Telehealth: Payer: Self-pay

## 2020-02-19 ENCOUNTER — Other Ambulatory Visit: Payer: Self-pay | Admitting: Hematology and Oncology

## 2020-02-19 DIAGNOSIS — I82452 Acute embolism and thrombosis of left peroneal vein: Secondary | ICD-10-CM

## 2020-02-19 MED ORDER — OXYCODONE HCL ER 15 MG PO T12A: 15.0000 mg | EXTENDED_RELEASE_TABLET | Freq: Two times a day (BID) | ORAL | 0 refills | Status: DC

## 2020-02-19 NOTE — Telephone Encounter (Signed)
Received call from pt's. spouse Virginia [name] regarding refill for her OXYCONTIN 15 mg 12 hr tablet. Informed him this has already been sent to [facility]. Virginia [name] reported that they had not been able to pick this up in the past due to need for prior authorization (PA), he asked if this had been done. Called pharmacy and requested prior authorization form be faxed to clinic. PA form given to [facility] for processing. Virginia [name] updated of status and encouraged to call back middle of next week for an update. He was agreeable with this and verbalized understanding.

## 2020-02-23 ENCOUNTER — Telehealth: Payer: Self-pay | Admitting: Internal Medicine

## 2020-02-23 ENCOUNTER — Ambulatory Visit: Payer: BC Managed Care – PPO | Admitting: Internal Medicine

## 2020-02-23 ENCOUNTER — Other Ambulatory Visit: Payer: Self-pay

## 2020-02-23 VITALS — BP 130/90 | HR 97 | Temp 98.9°F | Wt 135.0 lb

## 2020-02-23 DIAGNOSIS — H1032 Unspecified acute conjunctivitis, left eye: Secondary | ICD-10-CM

## 2020-02-23 DIAGNOSIS — C7801 Secondary malignant neoplasm of right lung: Secondary | ICD-10-CM | POA: Diagnosis not present

## 2020-02-23 MED ORDER — OFLOXACIN 0.3 % OP SOLN: OPHTHALMIC | 1 refills | Status: DC

## 2020-02-23 MED ORDER — ALPRAZOLAM 0.25 MG PO TABS: 0.2500 mg | ORAL_TABLET | Freq: Two times a day (BID) | ORAL | 5 refills | Status: DC | PRN

## 2020-02-23 NOTE — Patient Instructions (Addendum)
Use Ocuflox ophthalmic drops  2 drops 4 times a day for 5 to 7 days.  Wash pillowcases and towels daily until conjunctivitis is resolved.  Warm hot compresses 2 or 3 times daily to the left eye.

## 2020-02-23 NOTE — Telephone Encounter (Signed)
We are happy to see her but suggest she call oncologist first for advice

## 2020-02-23 NOTE — Telephone Encounter (Signed)
Virginia [name] [phone]  [name] called to say last night her left eye was sore and this morning her eye lid was puffy and swollen.

## 2020-02-23 NOTE — Telephone Encounter (Signed)
Will see today.  

## 2020-02-23 NOTE — Telephone Encounter (Signed)
Appointment scheduled.

## 2020-02-23 NOTE — Telephone Encounter (Signed)
Nurse Charolette Forward called said they are okay Dr Renold Genta seeing her for her eye. She said it would only take about 10 minutes for her to get here.

## 2020-02-23 NOTE — Telephone Encounter (Signed)
Patient was notified, she said she will call her oncologist.

## 2020-02-24 ENCOUNTER — Telehealth: Payer: Self-pay | Admitting: *Deleted

## 2020-02-24 ENCOUNTER — Other Ambulatory Visit: Payer: Self-pay | Admitting: Hematology and Oncology

## 2020-02-24 DIAGNOSIS — C3491 Malignant neoplasm of unspecified part of right bronchus or lung: Secondary | ICD-10-CM

## 2020-02-24 NOTE — Telephone Encounter (Signed)
"  [facility] intake reason for coverage denial of Oxycodone ER 15 mg as patient has failed only one preferred drug.  Must have tried and failed at least two preferred drugs.   Provider may then send request for reconsideration if preferred medications are ineffective.   Preferred medications include: 1. Tramadol (Has failed) 2. Xtampza ER 3. Generic Buprenorphine patch 4. Hydromorphone ER 5. Morphine Sulfate ER Consult with member to see which one she'd like to try.  Will also be cost effective if able to continue effective alternative."

## 2020-02-25 ENCOUNTER — Encounter: Payer: Self-pay | Admitting: Hematology and Oncology

## 2020-02-25 ENCOUNTER — Inpatient Hospital Stay: Payer: BC Managed Care – PPO

## 2020-02-25 ENCOUNTER — Inpatient Hospital Stay: Payer: BC Managed Care – PPO | Attending: Hematology and Oncology | Admitting: Hematology and Oncology

## 2020-02-25 ENCOUNTER — Other Ambulatory Visit: Payer: Self-pay

## 2020-02-25 VITALS — BP 129/88 | HR 79 | Temp 97.9°F | Resp 20 | Wt 139.3 lb

## 2020-02-25 DIAGNOSIS — Z7901 Long term (current) use of anticoagulants: Secondary | ICD-10-CM | POA: Insufficient documentation

## 2020-02-25 DIAGNOSIS — E876 Hypokalemia: Secondary | ICD-10-CM | POA: Diagnosis not present

## 2020-02-25 DIAGNOSIS — I82402 Acute embolism and thrombosis of unspecified deep veins of left lower extremity: Secondary | ICD-10-CM | POA: Insufficient documentation

## 2020-02-25 DIAGNOSIS — R519 Headache, unspecified: Secondary | ICD-10-CM | POA: Insufficient documentation

## 2020-02-25 DIAGNOSIS — I10 Essential (primary) hypertension: Secondary | ICD-10-CM | POA: Insufficient documentation

## 2020-02-25 DIAGNOSIS — C7931 Secondary malignant neoplasm of brain: Secondary | ICD-10-CM | POA: Diagnosis not present

## 2020-02-25 DIAGNOSIS — C3491 Malignant neoplasm of unspecified part of right bronchus or lung: Secondary | ICD-10-CM | POA: Diagnosis not present

## 2020-02-25 DIAGNOSIS — Z79899 Other long term (current) drug therapy: Secondary | ICD-10-CM | POA: Insufficient documentation

## 2020-02-25 DIAGNOSIS — C3431 Malignant neoplasm of lower lobe, right bronchus or lung: Secondary | ICD-10-CM | POA: Insufficient documentation

## 2020-02-25 DIAGNOSIS — R5383 Other fatigue: Secondary | ICD-10-CM | POA: Diagnosis not present

## 2020-02-25 DIAGNOSIS — Z7189 Other specified counseling: Secondary | ICD-10-CM | POA: Diagnosis not present

## 2020-02-25 DIAGNOSIS — R112 Nausea with vomiting, unspecified: Secondary | ICD-10-CM | POA: Diagnosis not present

## 2020-02-25 DIAGNOSIS — C7951 Secondary malignant neoplasm of bone: Secondary | ICD-10-CM

## 2020-02-25 DIAGNOSIS — M549 Dorsalgia, unspecified: Secondary | ICD-10-CM | POA: Insufficient documentation

## 2020-02-25 LAB — CBC WITH DIFFERENTIAL (CANCER CENTER ONLY)
Abs Immature Granulocytes: 0.02 10*3/uL (ref 0.00–0.07)
Basophils Absolute: 0 10*3/uL (ref 0.0–0.1)
Basophils Relative: 0 %
Eosinophils Absolute: 0.2 10*3/uL (ref 0.0–0.5)
Eosinophils Relative: 3 %
HCT: 33.7 % — ABNORMAL LOW (ref 36.0–46.0)
Hemoglobin: 11.5 g/dL — ABNORMAL LOW (ref 12.0–15.0)
Immature Granulocytes: 0 %
Lymphocytes Relative: 23 %
Lymphs Abs: 1.4 10*3/uL (ref 0.7–4.0)
MCH: 31.6 pg (ref 26.0–34.0)
MCHC: 34.1 g/dL (ref 30.0–36.0)
MCV: 92.6 fL (ref 80.0–100.0)
Monocytes Absolute: 0.6 10*3/uL (ref 0.1–1.0)
Monocytes Relative: 10 %
Neutro Abs: 3.9 10*3/uL (ref 1.7–7.7)
Neutrophils Relative %: 64 %
Platelet Count: 261 10*3/uL (ref 150–400)
RBC: 3.64 MIL/uL — ABNORMAL LOW (ref 3.87–5.11)
RDW: 12.3 % (ref 11.5–15.5)
WBC Count: 6 10*3/uL (ref 4.0–10.5)
nRBC: 0 % (ref 0.0–0.2)

## 2020-02-25 LAB — CMP (CANCER CENTER ONLY)
ALT: 12 U/L (ref 0–44)
AST: 9 U/L — ABNORMAL LOW (ref 15–41)
Albumin: 2.8 g/dL — ABNORMAL LOW (ref 3.5–5.0)
Alkaline Phosphatase: 76 U/L (ref 38–126)
Anion gap: 8 (ref 5–15)
BUN: 12 mg/dL (ref 8–23)
CO2: 26 mmol/L (ref 22–32)
Calcium: 8.4 mg/dL — ABNORMAL LOW (ref 8.9–10.3)
Chloride: 110 mmol/L (ref 98–111)
Creatinine: 0.84 mg/dL (ref 0.44–1.00)
GFR, Est AFR Am: >60 mL/min (ref >60)
GFR, Estimated: >60 mL/min (ref >60)
Glucose, Bld: 96 mg/dL (ref 70–99)
Potassium: 3.8 mmol/L (ref 3.5–5.1)
Sodium: 144 mmol/L (ref 135–145)
Total Bilirubin: 0.3 mg/dL (ref 0.3–1.2)
Total Protein: 5.2 g/dL — ABNORMAL LOW (ref 6.5–8.1)

## 2020-02-25 LAB — MAGNESIUM: Magnesium: 1.5 mg/dL — ABNORMAL LOW (ref 1.7–2.4)

## 2020-02-25 MED ORDER — HEPARIN SOD (PORK) LOCK FLUSH 100 UNIT/ML IV SOLN
500.0000 [IU] | Freq: Once | INTRAVENOUS | Status: AC | PRN
  Administered 2020-02-25: 500 [IU]
  Filled 2020-02-25: qty 5

## 2020-02-25 MED ORDER — OLANZAPINE 2.5 MG PO TABS: 2.5000 mg | ORAL_TABLET | Freq: Every day | ORAL | 1 refills | Status: DC

## 2020-02-25 MED ORDER — SODIUM CHLORIDE 0.9% FLUSH
10.0000 mL | Freq: Once | INTRAVENOUS | Status: AC | PRN
  Administered 2020-02-25: 10 mL
  Filled 2020-02-25: qty 10

## 2020-02-25 MED ORDER — CAPMATINIB HCL 200 MG PO TABS: ORAL_TABLET | ORAL | 3 refills | Status: DC

## 2020-02-25 MED ORDER — MORPHINE SULFATE ER 30 MG PO TBCR: 30.0000 mg | EXTENDED_RELEASE_TABLET | Freq: Two times a day (BID) | ORAL | 0 refills | Status: DC

## 2020-02-25 MED FILL — TABRECTA 200 MG TABS: 200 | 14 days supply | Qty: 56 | Fill #0

## 2020-02-25 MED FILL — MORPHINE SULF ER 30 MG TAB: 30 | 28 days supply | Qty: 56 | Fill #0

## 2020-02-25 NOTE — Progress Notes (Signed)
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 CT of the head showed showed concerning for metastatic disease involving the left cerebellum and right occipital lobe.  2) 09/30/2019: MRI brain confirms mass within the left cerebellum measures 1.6 x 1.3 x 1.5 cm as well as at least 8 metastatic lesions within the supratentorial and infratentorial brain as outlined 3) 10/01/2019: PET CT scan revealed hypermetabolic infrahilar right lower lobe mass with hypermetabolic nodal metastases in the right hilum, mediastinum, right supraclavicular region, left axilla and lower left neck. 4) 10/08/2019: US guided biopsy of the lymph nodes confirms poorly differentiated non-small cell carcinoma. Immunohistochemistry confirms adenocarcinoma of lung primary. PD-L1 and NGS later revealed a MET Exon 14 mutation and TPS of 90%.  5) 10/12/2019: Establish care with Dr. Lorenso Courier  6) 11/04/2019: Day 1 of capmatinib '400mg'$  BID 7) 11/09/2019: presented to Rad/Onc with a DVT in LLE. Seen in symptom management clinic and started on apixaban.  8) 12/29/2019: CT C/A/P showed interval decrease in size of mass involving the superior segment of right lower lobe and reduction in mediastinal and left supraclavicular adenopathy though some small spinal lesions were noted in the interim between the two sets of scans 9) 01/25/2020: temporarily stopped capmatinib due to symptoms of nausea/fatigue. Restarted therapy on 01/30/2020 after brief chemo holiday.   Interval History:  Virginia [name] 64 y.o. female with medical history  significant for metastatic adenocarcinoma of the lung presents for a follow up visit. The patient's last visit was on 01/29/2020 at which time she was continued on capmatinib therapy. In the interim since the last visit she has had a clinic visit with Dr. Isidore Moos on 02/12/2020.   On exam today Virginia [name] notes marked improvement in her nausea since her last visit.  She has been taking the Zyprexa 2.5 mg p.o. daily and has been tolerating it quite well.  With the assistance of this medication she is no longer taking Compazine on a regular basis.  She reports that her primary issue continues to be her back pain.  She reports that as she sits still the pain is 0 out of 10, when she moves it can increase up to 4 out of 10.  She is currently still taking the oxycodone as needed,, but unfortunately due to insurance issues she was not able to fill up the prescription for OxyContin.  She notes that the pain has changed somewhat and has migrated from the center of the back to off to the left side.  It was recommended by Dr. Isidore Moos that she had a visit with neurosurgery for consideration of kyphoplasty to her T8 vertebra.  The patient currently denies having pain elsewhere.  The patient does have an episode of pinkeye at the moment for which she is taking eyedrops prescribed by her PCP.  The left eye is the affected eye and there has been no purulent discharge or marked erythema.  She notes that she is also not been rubbing the eye and there is been no itchiness.  On further discussing this he notes denies having any issues with fevers, chills, sweats, diarrhea, or constipation.  She  notes that her breathing has been good and she has not had any issues with cough, shortness of breath, or chest pain.  A full 10 point ROS is listed below.  MEDICAL HISTORY:  Past Medical History:  Diagnosis Date  . Anxiety   . Dyspnea   . GERD (gastroesophageal reflux disease)   . Hypercholesterolemia    per pt, she does not have  elevated lipids  . Hypertension   . met lung ca dx'd 09/2019   mets to spine, hip and brain  . Tobacco abuse     SURGICAL HISTORY: Past Surgical History:  Procedure Laterality Date  . ABDOMINAL HYSTERECTOMY     partial/ left ovaries  . CHOLECYSTECTOMY    . DILATION AND CURETTAGE OF UTERUS    . IR IMAGING GUIDED PORT INSERTION  10/23/2019  . LIPOMA EXCISION  2018   removed under left breast and right thigh.  . TUBAL LIGATION      ALLERGIES:  has No Known Allergies.  MEDICATIONS:  Current Outpatient Medications  Medication Sig Dispense Refill  . acetaminophen (TYLENOL) 500 MG tablet Take 500-1,000 mg by mouth every 6 (six) hours as needed (for pain.).    Marland Kitchen albuterol (VENTOLIN HFA) 108 (90 Base) MCG/ACT inhaler Inhale 2 puffs into the lungs every 6 (six) hours as needed for wheezing or shortness of breath. 6.7 g 11  . ALPRAZolam (XANAX) 0.25 MG tablet Take 1 tablet (0.25 mg total) by mouth 2 (two) times daily as needed. for anxiety 60 tablet 5  . capmatinib (TABRECTA) 200 MG tablet TAKE 2 TABLETS (400 MG TOTAL) BY MOUTH 2 (TWO) TIMES DAILY. 56 tablet 1  . Cyanocobalamin (VITAMIN B12 PO) Take by mouth daily.    Marland Kitchen ELIQUIS 5 MG TABS tablet TAKE ONE TABLET BY MOUTH TWICE A DAY 60 tablet 1  . furosemide (LASIX) 20 MG tablet 20 mg once daily for 3 days, then stop. May repeat if edema recurs. 30 tablet 1  . glycerin adult 2 g suppository Place 1 suppository rectally as needed for constipation. 12 suppository 0  . lidocaine-prilocaine (EMLA) cream Apply 1 application topically as needed. Apply to port as needed. 30 g 0  . Multiple Vitamins-Minerals (AIRBORNE PO) Take by mouth daily.    Marland Kitchen ofloxacin (OCUFLOX) 0.3 % ophthalmic solution 2 drops each eye 4 times a day for 5 days 5 mL 1  . OLANZapine (ZYPREXA) 2.5 MG tablet Take 1 tablet (2.5 mg total) by mouth at bedtime. 14 tablet 1  . OVER THE COUNTER MEDICATION Apply 1 application topically daily as needed (shoulder pain.). [place] Gel OTC    .  oxyCODONE (OXY IR/ROXICODONE) 5 MG immediate release tablet TAKE 1 TO 2 TABLETS BY MOUTH EVERY 4 HOURS AS NEEDED FOR SEVERE OR BREAKTHOUGH PAIN 180 tablet 0  . pantoprazole (PROTONIX) 40 MG tablet TAKE 1 TABLET(40 MG) BY MOUTH DAILY 90 tablet 3  . polyethylene glycol (MIRALAX / GLYCOLAX) 17 g packet Take 17 g by mouth daily.    . prochlorperazine (COMPAZINE) 10 MG tablet TAKE 1 TABLET BY MOUTH EVERY 6 HOURS AS NEEDED FOR NAUSEA OR VOMITING (Patient not taking: Reported on 02/25/2020) 30 tablet 0  . sennosides-docusate sodium (SENOKOT-S) 8.6-50 MG tablet Take 1 tablet by mouth daily. 2 in the morning and 2 at night. Adjust as needed    . sucralfate (CARAFATE) 1 g tablet Dissolve 1 tablet in 10 mL H20 and swallow 30 min prior to meals and bedtime. (Patient not taking: Reported on 02/25/2020)  50 tablet 3  . Vitamin D3 (VITAMIN D) 25 MCG tablet Take 2,000 Units by mouth daily.     No current facility-administered medications for this visit.    REVIEW OF SYSTEMS:   Constitutional: ( - ) fevers, ( - )  chills , ( - ) night sweats Eyes: ( - ) blurriness of vision, ( - ) double vision, ( - ) watery eyes Ears, nose, mouth, throat, and face: ( - ) mucositis, ( - ) sore throat Respiratory: ( - ) cough, ( - ) dyspnea, ( - ) wheezes Cardiovascular: ( - ) palpitation, ( - ) chest discomfort, ( - ) lower extremity swelling Gastrointestinal:  ( - ) nausea, ( - ) heartburn, ( - ) change in bowel habits Skin: ( - ) abnormal skin rashes Lymphatics: ( - ) new lymphadenopathy, ( - ) easy bruising Neurological: ( - ) numbness, ( - ) tingling, ( - ) new weaknesses Behavioral/Psych: ( - ) mood change, ( - ) new changes  All other systems were reviewed with the patient and are negative.  PHYSICAL EXAMINATION: ECOG PERFORMANCE STATUS: 1 - Symptomatic but completely ambulatory  Vitals:   02/25/20 1108  BP: 129/88  Pulse: 79  Resp: 20  Temp: 97.9 F (36.6 C)  SpO2: 99%   Filed Weights   02/25/20 1108   Weight: 139 lb 4.8 oz (63.2 kg)    GENERAL: well appearing middle aged Caucasian female in NAD  SKIN: skin color, texture, turgor are normal, no rashes or significant lesions EYES: conjunctiva are pink and non-injected, sclera clear LUNGS: clear to auscultation and percussion with normal breathing effort HEART: regular rate & rhythm and no murmurs and no lower extremity edema Musculoskeletal: no cyanosis of digits and no clubbing. Some tenderness on palpation of right leg.  PSYCH: alert & oriented x 3, fluent speech NEURO: no focal motor/sensory deficits   LABORATORY DATA:  I have reviewed the data as listed CBC Latest Ref Rng & Units 02/25/2020 01/29/2020 01/13/2020  WBC 4.0 - 10.5 K/uL 6.0 5.5 9.0  Hemoglobin 12.0 - 15.0 g/dL 11.5(L) 12.3 12.2  Hematocrit 36 - 46 % 33.7(L) 36.8 36.0  Platelets 150 - 400 K/uL 261 264 263    CMP Latest Ref Rng & Units 02/25/2020 01/29/2020 01/13/2020  Glucose 70 - 99 mg/dL 96 102(H) 129(H)  BUN 8 - 23 mg/dL '12 9 9  '$ Creatinine 0.44 - 1.00 mg/dL 0.84 0.68 0.70  Sodium 135 - 145 mmol/L 144 143 136  Potassium 3.5 - 5.1 mmol/L 3.8 3.4(L) 3.8  Chloride 98 - 111 mmol/L 110 111 107  CO2 22 - 32 mmol/L '26 25 24  '$ Calcium 8.9 - 10.3 mg/dL 8.4(L) 8.6(L) 8.5(L)  Total Protein 6.5 - 8.1 g/dL 5.2(L) 5.7(L) 5.5(L)  Total Bilirubin 0.3 - 1.2 mg/dL 0.3 0.3 0.6  Alkaline Phos 38 - 126 U/L 76 66 74  AST 15 - 41 U/L 9(L) 10(L) 13(L)  ALT 0 - 44 U/L '12 17 17     '$ RADIOGRAPHIC STUDIES:  MR Brain W Wo Contrast  Result Date: 02/11/2020 CLINICAL DATA:  Metastatic brain cancer. Surveillance post [facility] treatment. EXAM: MRI HEAD WITHOUT AND WITH CONTRAST TECHNIQUE: Multiplanar, multiecho pulse sequences of the brain and surrounding structures were obtained without and with intravenous contrast. CONTRAST:  3m MULTIHANCE GADOBENATE DIMEGLUMINE 529 MG/ML IV SOLN COMPARISON:  MRI head 10/28/2019 FINDINGS: Brain: Interval improvement in multiple metastatic deposits in the brain.  Prior MRI demonstrated 15 metastatic lesions. Many of these  have resolved completely, the remaining are smaller. Small lesions in the right cerebellum are smaller. Left cerebellar necrotic lesion now measures 9 x 6 mm, previously 18 mm. Decreased edema in the left cerebellum. Additional small 3 mm enhancing lesions in the right occipital lobe and left frontal parietal lobe are marked with an arrow on axial postcontrast images. Microhemorrhage has developed in the left frontal parietal 3 mm lesion since the prior study. No other areas of intracranial hemorrhage. No new lesions. Ventricle size normal. Patchy white matter hyperintensity compatible with chronic microvascular ischemia. Negative for acute infarct. Vascular: Normal arterial flow voids Skull and upper cervical spine: Left parietal convexity enhancing lesion has improved. Right occipital bone lesion is bright on T1 and unchanged and may be a hemangioma. Sinuses/Orbits: Mild mucosal edema paranasal sinuses. Negative orbit Other: None IMPRESSION: Marked interval improvement in metastatic disease. Previously noted 15 metastatic deposits are either smaller or have resolved completely compared with the prior MRI. No new lesions. Mild microhemorrhage in the left frontal parietal metastasis has developed since the prior MRI. Electronically Signed   By: Franchot Gallo M.D.   On: 02/11/2020 08:09    ASSESSMENT & PLAN SHALYN KORAL 64 y.o. female with medical history significant for metastatic adenocarcinoma of the lung presents for a follow up visit. 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   Today Virginia [name] reports improvement with the nausea on Zyprexa therapy.  Fortunately she has been able to wean off most of her Compazine with this.  Unfortunately pain control still remains quite challenging though it is improved from prior.  She is currently taking oxycodone as needed but a long-acting has not been able to be  added due to insurance issues.  She has an episode of pinkeye which is currently being treated with eye drops.  Previously we discussed capmatinib and the expected side effect from this medication. Capmatinib can cause peripheral edema, diarrhea, increased liver enzymes, drop in blood counts, fatigue, increase in serum creatinine, and rarely acute pancreatitis or acute renal failure. We also discussed the efficacy of the medication by discussing the phase II multicenter multicohort GEOMETRY mono-1 trial, which showed the overall response rate was 68% (95% confidence interval [CI] = 48%--84%), with a complete response in 4%, and the median response duration was 12.6 months (95% CI = 5.5-25.3 months). We also noted that chemotherapy/immunotherapy can be administered after progression or intolerance of capmatinib. The study showed this medication was most effective when used in the first line setting (Among previously treated patients, the overall response rate (all partial responses) was 41% (95% CI = 29%-53%), and the median response duration was 9.7 months (95% CI = 5.5-13.0 months).   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 Started on capmatinib '400mg'$  BID on 11/04/2019. Stopped medication on 01/25/2020 due to nausea, restarted at last visit.  --I offered numerous options including transitioning to another medication with similar activity, transitioning to immunotherapy +/- chemotherapy, or dose reduction of Tabrecta (though this option is not well studied). After discussion at our last visit the patient notes she wants to continue Tabrecta at the current dose.  --hold plans to start Carbo/Pem/Pem. This can be started if patient has progression or intolerance on oral therapy.  --patient will be followed every 4 weeks with labs and clinic visit. --plan for repeat CT C/A/P in July  2021 (3 months from last  scan in April 2021) --supportive therapy as listed below.  --RTC q 4 weeks for continued monitoring.    #Nausea/Vomiting --will provide patient with Zofran 4-'8mg'$  q8h PRN and compazine '10mg'$  q6H for breakthrough PRN --continue olanzpaine 2.'5mg'$  PO QHS to help with nausea. Marked improvement since the addition of this medication on 01/29/2020.  --can consider Reglan if nausea worsens as it may be gastric motility issues related to her pain medication causing her symptoms.  --continue to monitor   #Hypokalemia, stable --likely 2/2 to decreased PO intake, hypovolemia, and BP medications --given her decreased BP would recommend holding losartan and hydrochlorothiazide --continue to monitor  #Pain Control, improving --continue oxycodone '5mg'$  q4H PRN. Can increase dose to '10mg'$  q6H if pain is severe --added MS Contin '30mg'$  q12 H scheduled to her regimen today for long acting support.  --continue to take with Senokot to prevent opioid induced constipation. 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  --nausea as above   #Headache, improved --appreciate the assistance of Dr. 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 --radiation therapy to the brain completed on 11/16/2019.  --continue to monitor   No orders of the defined types were placed in this encounter.  All questions were answered. The patient knows to call the clinic with any problems, questions or concerns.  A total of more than 30 minutes were spent on this encounter and over half of that time was spent on counseling and coordination of care as outlined above.   Ledell Peoples, MD Department of Hematology/Oncology [place] at [facility] Phone: [phone] Pager: [phone] Email: [email or web address]  02/25/2020 11:57 AM   Literature Support:   Angelique Holm, Han JY, et al:  Capmatinib [phone]) in MET ex14-mutated advanced non-small cell lung cancer: Efficacy data from the phase II GEOMETRY mono-1 study. 2019 ASCO Annual Meeting. Abstract 9004. Presented February 10, 2018.  --the overall response rate was 68% (95% confidence interval [CI] = 48%--84%), with a complete response in 4%, and the median response duration was 12.6 months (95% CI = 5.5-25.3 months).

## 2020-02-29 DIAGNOSIS — I10 Essential (primary) hypertension: Secondary | ICD-10-CM | POA: Diagnosis not present

## 2020-02-29 DIAGNOSIS — S22000G Wedge compression fracture of unspecified thoracic vertebra, subsequent encounter for fracture with delayed healing: Secondary | ICD-10-CM | POA: Diagnosis not present

## 2020-02-29 DIAGNOSIS — C7951 Secondary malignant neoplasm of bone: Secondary | ICD-10-CM | POA: Diagnosis not present

## 2020-02-29 DIAGNOSIS — M546 Pain in thoracic spine: Secondary | ICD-10-CM | POA: Diagnosis not present

## 2020-03-01 ENCOUNTER — Telehealth: Payer: Self-pay | Admitting: Hematology and Oncology

## 2020-03-01 NOTE — Telephone Encounter (Signed)
Scheduled per los. Called and left msg. Mailed printout  °

## 2020-03-02 ENCOUNTER — Encounter: Payer: Self-pay | Admitting: Internal Medicine

## 2020-03-02 NOTE — Progress Notes (Signed)
   Subjective:    Patient ID: Virginia [name], female    DOB: 25-Jan-1956, 64 y.o.   MRN: [id number]  HPI 64 year old Female with metatstatic adenocarcinoma of the lung treated by Oncology with capmatinib and Tabrecta. Is to have repeat imaging in July. 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  Has developed swelling and redness left eyelid.No visual changes.Some drainage in corner of eye that is discolored. Vision is OK. No other symptoms such as respiratory congestion or headache.    Review of Systems generalized fatigue     Objective:   Physical Exam BP 130/90 pulse 97 T 98.9 degrees Pulse ox 96% Weight 135 pounds  Swelling and redness left upper eyelid.  Extraocular movements are full.  PERRLA.       Assessment & Plan:  Acute bacterial conjunctivitis left eye  Plan: Warm hot compresses to eye 20 minutes 2 or 3 times daily.  Wash pillowcases and towels daily until conjunctivitis is resolved.  Ocuflox ophthalmic drops 2 drops in left eye 4 times a day for 5 to 7 days.

## 2020-03-08 ENCOUNTER — Other Ambulatory Visit: Payer: Self-pay | Admitting: Neurosurgery

## 2020-03-09 ENCOUNTER — Other Ambulatory Visit: Payer: Self-pay | Admitting: Radiation Therapy

## 2020-03-09 MED FILL — TABRECTA 200 MG TABS: 200 | 14 days supply | Qty: 56 | Fill #1

## 2020-03-10 ENCOUNTER — Other Ambulatory Visit: Payer: Self-pay

## 2020-03-10 ENCOUNTER — Encounter (HOSPITAL_COMMUNITY): Payer: Self-pay

## 2020-03-10 ENCOUNTER — Encounter (HOSPITAL_COMMUNITY)
Admission: RE | Admit: 2020-03-10 | Discharge: 2020-03-10 | Disposition: A | Discharge status: Home / Self Care | Payer: BC Managed Care – PPO | Source: Ambulatory Visit | Attending: Neurosurgery | Admitting: Neurosurgery

## 2020-03-10 ENCOUNTER — Other Ambulatory Visit: Payer: Self-pay | Admitting: Neurosurgery

## 2020-03-10 DIAGNOSIS — Z01812 Encounter for preprocedural laboratory examination: Secondary | ICD-10-CM | POA: Insufficient documentation

## 2020-03-10 HISTORY — DX: Other specified postprocedural states: Z98.890

## 2020-03-10 HISTORY — DX: Anemia, unspecified: D64.9

## 2020-03-10 HISTORY — DX: Nausea with vomiting, unspecified: R11.2

## 2020-03-10 LAB — CBC
HCT: 37.9 % (ref 36.0–46.0)
Hemoglobin: 12.4 g/dL (ref 12.0–15.0)
MCH: 30.8 pg (ref 26.0–34.0)
MCHC: 32.7 g/dL (ref 30.0–36.0)
MCV: 94.3 fL (ref 80.0–100.0)
Platelets: 310 10*3/uL (ref 150–400)
RBC: 4.02 MIL/uL (ref 3.87–5.11)
RDW: 12.6 % (ref 11.5–15.5)
WBC: 7 10*3/uL (ref 4.0–10.5)
nRBC: 0 % (ref 0.0–0.2)

## 2020-03-10 LAB — BASIC METABOLIC PANEL
Anion gap: 7 (ref 5–15)
BUN: 10 mg/dL (ref 8–23)
CO2: 27 mmol/L (ref 22–32)
Calcium: 8.6 mg/dL — ABNORMAL LOW (ref 8.9–10.3)
Chloride: 106 mmol/L (ref 98–111)
Creatinine, Ser: 0.89 mg/dL (ref 0.44–1.00)
GFR calc Af Amer: >60 mL/min (ref >60)
GFR calc non Af Amer: >60 mL/min (ref >60)
Glucose, Bld: 123 mg/dL — ABNORMAL HIGH (ref 70–99)
Potassium: 3.6 mmol/L (ref 3.5–5.1)
Sodium: 140 mmol/L (ref 135–145)

## 2020-03-10 LAB — SURGICAL PCR SCREEN
MRSA, PCR: NEGATIVE
Staphylococcus aureus: NEGATIVE

## 2020-03-10 NOTE — Pre-Procedure Instructions (Addendum)
Virginia [name]  03/10/2020      Your procedure is scheduled on Tuesday, January 6..  Report to [facility] Admitting at 10:30 AM                  Your surgery is scheduled to begin at 12:30 PM  Call this number if you have problems the morning of surgery: [phone]  This is the number for the Pre- Surgical Desk.  For any other questions, please call [phone],  Friday 8 AM - 4 PM.   Remember:  Do not eat or drink after midnight Monday, July 5.    Take these medicines the morning of surgery with A SIP OF WATER:  capmatinib (TABRECTA)             pantoprazole (PROTONIX) Take if needed: oxyCODONE (OXY IR/ROXICODONE) albuterol (VENTOLIN HFA) please bring inhaler to the hospital with you. ALPRAZolam Duanne Moron)  Follow your surgeon's instructions regarding Eliquis  STOP taking Aspirin, Aspirin Products (Goody Powder, Excedrin Migraine), Ibuprofen (Advil), Naproxen (Aleve), Vitamins and Herbal Products (ie Fish Oil).  Morning of Surgery: . Do not wear jewelry, make-up or nail polish.  Do not wear lotions, powders, or perfumes, or deodorant.  Do not shave 48 hours prior to surgery.  Men may shave face and neck.  Do not bring valuables to the hospital.  [facility] is not responsible for any belongings or valuables.  Contacts, dentures or bridgework may not be worn into surgery.  Leave your suitcase in the car.  After surgery it may be brought to your room.  For patients admitted to the hospital, discharge time will be determined by your treatment team.  Patients discharged the day of surgery will not be allowed to drive home.   Special instructions:  [place]- Preparing For Surgery  Before surgery, you can play an important role. Because skin is not sterile, your skin needs to be as free of germs as possible. You can reduce the number of germs on your skin by washing with CHG (chlorahexidine gluconate) Soap before surgery.  CHG is an antiseptic cleaner which  kills germs and bonds with the skin to continue killing germs even after washing.    Oral Hygiene is also important to reduce your risk of infection.  Remember - BRUSH YOUR TEETH THE MORNING OF SURGERY WITH YOUR REGULAR TOOTHPASTE  Please do not use if you have an allergy to CHG or antibacterial soaps. If your skin becomes reddened/irritated stop using the CHG.  Do not shave (including legs and underarms) for at least 48 hours prior to first CHG shower. It is OK to shave your face.  Please follow these instructions carefully.   1. Shower the NIGHT BEFORE SURGERY and the MORNING OF SURGERY with CHG.   2. If you chose to wash your hair, wash your hair first as usual with your normal shampoo.  3. After you shampoo, wash your face and private area with the soap you use at home, then rinse your hair and body thoroughly to remove the shampoo and soap.  4. Use CHG as you would any other liquid soap. You can apply CHG directly to the skin and wash gently with a scrungie or a clean washcloth.   5. Apply the CHG Soap to your body ONLY FROM THE NECK DOWN.  Do not use on open wounds or open sores. Avoid contact with your eyes, ears, mouth and genitals (private parts).   6. Wash thoroughly, paying  special attention to the area where your surgery will be performed.  7. Thoroughly rinse your body with warm water from the neck down.  8. DO NOT shower/wash with your normal soap after using and rinsing off the CHG Soap.  9. Pat yourself dry with a CLEAN TOWEL.  10. Wear CLEAN PAJAMAS to bed the night before surgery, wear comfortable clothes the morning of surgery  11. Place CLEAN SHEETS on your bed the night of your first shower and DO NOT SLEEP WITH PETS.   Day of Surgery: Shower as instructed above. Do not apply any deodorants/lotions.  Please wear clean clothes to the hospital/surgery center.   Remember to brush your teeth WITH YOUR REGULAR TOOTHPASTE.  Please read over the fact sheets that  you were given.

## 2020-03-10 NOTE — Pre-Procedure Instructions (Signed)
Your procedure is scheduled on Tuesday, July 6 from 12:30 PM- 1:41 PM.  Report to [facility] Main Entrance "A" at 10:30 A.M., and check in at the Admitting office.  Call this number if you have problems the morning of surgery:  [phone]  Call [phone] if you have any questions prior to your surgery date Monday-Friday 8am-4pm.    Remember:  Do not eat or drink after midnight the night before your surgery.     Take these medicines the morning of surgery with A SIP OF WATER: capmatinib (TABRECTA) pantoprazole (PROTONIX)   IF NEEDED: ALPRAZolam (XANAX)  oxyCODONE (OXY IR/ROXICODONE) albuterol (VENTOLIN HFA) inhaler- bring with you the day of surgery.   *Follow your surgeon's instructions on when to stop ELIQUIS.  If no instructions were given by your surgeon then you will need to call the office to get those instructions.     As of today, STOP taking any Aspirin (unless otherwise instructed by your surgeon) and Aspirin containing products, Aleve, Naproxen, Ibuprofen, Motrin, Advil, Goody's, BC's, all herbal medications, fish oil, and all vitamins.         The Morning of Surgery:             Do not wear jewelry, make up, or nail polish.            Do not wear lotions, powders, perfumes, or deodorant.            Do not shave 48 hours prior to surgery.              Do not bring valuables to the hospital.            [facility] is not responsible for any belongings or valuables.  Do NOT Smoke (Tobacco/Vapping) or drink Alcohol 24 hours prior to your procedure.  If you use a CPAP at night, you may bring all equipment for your overnight stay.   Contacts, glasses, dentures or bridgework may not be worn into surgery.      For patients admitted to the hospital, discharge time will be determined by your treatment team.   Patients discharged the day of surgery will not be allowed to drive home, and someone needs to stay with them for 24 hours.    Special instructions:    - Preparing For Surgery  Before surgery, you can play an important role. Because skin is not sterile, your skin needs to be as free of germs as possible. You can reduce the number of germs on your skin by washing with CHG (chlorahexidine gluconate) Soap before surgery.  CHG is an antiseptic cleaner which kills germs and bonds with the skin to continue killing germs even after washing.    Oral Hygiene is also important to reduce your risk of infection.  Remember - BRUSH YOUR TEETH THE MORNING OF SURGERY WITH YOUR REGULAR TOOTHPASTE  Please do not use if you have an allergy to CHG or antibacterial soaps. If your skin becomes reddened/irritated stop using the CHG.  Do not shave (including legs and underarms) for at least 48 hours prior to first CHG shower. It is OK to shave your face.  Please follow these instructions carefully.   1. Shower the NIGHT BEFORE SURGERY and the MORNING OF SURGERY with CHG Soap.   2. If you chose to wash your hair, wash your hair first as usual with your normal shampoo.  3. After you shampoo, rinse your hair and body thoroughly to remove the shampoo.  4. Use CHG as you would any other liquid soap. You can apply CHG directly to the skin and wash gently with a scrungie or a clean washcloth.   5. Apply the CHG Soap to your body ONLY FROM THE NECK DOWN.  Do not use on open wounds or open sores. Avoid contact with your eyes, ears, mouth and genitals (private parts). Wash Face and genitals (private parts)  with your normal soap.   6. Wash thoroughly, paying special attention to the area where your surgery will be performed.  7. Thoroughly rinse your body with warm water from the neck down.  8. DO NOT shower/wash with your normal soap after using and rinsing off the CHG Soap.  9. Pat yourself dry with a CLEAN TOWEL.  10. Wear CLEAN PAJAMAS to bed the night before surgery, wear comfortable clothes the morning of surgery  11. Place CLEAN SHEETS on your bed  the night of your first shower and DO NOT SLEEP WITH PETS.   Day of Surgery: Shower with CHG Soap.   Do not apply any deodorants/lotions.  Please wear clean clothes to the hospital/surgery center.   Remember to brush your teeth WITH YOUR REGULAR TOOTHPASTE.   Please read over the following fact sheets that you were given.

## 2020-03-10 NOTE — Progress Notes (Addendum)
PCP - Cresenciano Lick. Renold Genta, MD Cardiologist - Denies Oncologist- Orson Slick, MD  PPM/ICD - Denies  Chest x-ray - 12/17/19 EKG - 09/29/19 Stress Test - 01/03/12 ECHO - Denies Cardiac Cath - Denies  Sleep Study - Denies  Patient denies being diabetic.  Blood Thinner Instructions: Per patient, stop Eliquis 2 days prior to sx.  Aspirin Instructions: N/A  ERAS Protcol - N/A PRE-SURGERY Ensure or G2- N/A  COVID TEST- 03/11/20   Anesthesia review: Review Stress test; current chemo tx.  Patient denies shortness of breath, fever, cough and chest pain at PAT appointment   All instructions explained to the patient, with a verbal understanding of the material. Patient agrees to go over the instructions while at home for a better understanding. Patient also instructed to self quarantine after being tested for COVID-19. The opportunity to ask questions was provided.

## 2020-03-11 ENCOUNTER — Other Ambulatory Visit (HOSPITAL_COMMUNITY)
Admission: RE | Admit: 2020-03-11 | Discharge: 2020-03-11 | Disposition: A | Discharge status: Home / Self Care | Payer: BC Managed Care – PPO | Source: Ambulatory Visit | Attending: Neurosurgery | Admitting: Neurosurgery

## 2020-03-11 DIAGNOSIS — Z20822 Contact with and (suspected) exposure to covid-19: Secondary | ICD-10-CM | POA: Insufficient documentation

## 2020-03-11 DIAGNOSIS — Z01812 Encounter for preprocedural laboratory examination: Secondary | ICD-10-CM | POA: Insufficient documentation

## 2020-03-11 LAB — SARS CORONAVIRUS 2 (TAT 6-24 HRS): SARS Coronavirus 2: NEGATIVE

## 2020-03-11 NOTE — Progress Notes (Signed)
Anesthesia Chart Review:  Follows with oncology for metastatic adenocarcinoma of the lung, mets to brain.  She is currently maintained on capmatinib. 12/29/2019: CT C/A/P showed interval decrease in size of mass involving the superior segment of right lower lobe and reduction in mediastinal and left supraclavicular adenopathy.  Oncology notes state she is having ongoing pain control issues felt to be due to T8 vertebral fracture, and Dr. Isidore Moos recommended she see neurosurgery for consideration of kyphoplasty.  Has had difficulty with persistent N/V on chemo. Per notes, improving.   Patient had cardiac eval 2013 for atypical chest pain.  Ultimately had normal nuclear stress test, no ischemia, normal LV function.  Discussed recent CT chest showing new small-mod pericardial effusion with Dr. [name] Heck. He advised that she can proceed as planned barring acute status change. Will be evaluated DOS.   Preop labs reviewed, unremarkable.   EKG 09/29/19: Sinus tach. Rate 100. Borderline LAD. Abnormal r-wave progression, late transition.   CT chest 12/29/19: IMPRESSION: 1. Interval decrease in size of mass involving the superior segment of right lower lobe. 2. Interval reduction in mediastinal and left supraclavicular adenopathy. 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 The CT manifestation of these metastasis is new when compared with most recent cross-sectional imaging from 10/01/2019. 3. New small to moderate pericardial effusion. 4. Multi vessel coronary artery atherosclerotic calcifications. 5. Aortic atherosclerosis.  Nuclear stress 01/02/2012: Normal stress nuclear study.  LV Ejection Fraction: 64%.  LV Wall Motion:  NL LV Function; NL Wall Motion   Wynonia Musty [facility] Short Stay Center/Anesthesiology Phone [phone] 03/11/2020 12:04 PM

## 2020-03-11 NOTE — Anesthesia Preprocedure Evaluation (Addendum)
Anesthesia Evaluation  Patient identified by MRN, date of birth, ID band Patient awake    Reviewed: Allergy & Precautions, NPO status , Patient's Chart, lab work & pertinent test results  Airway Mallampati: II  TM Distance: <3 FB Neck ROM: full    Dental  (+) Teeth Intact, Dental Advidsory Given   Pulmonary COPD,  COPD inhaler, former smoker,    breath sounds clear to auscultation       Cardiovascular hypertension,  Rhythm:regular Rate:Normal     Neuro/Psych negative neurological ROS     GI/Hepatic GERD  Medicated and Controlled,  Endo/Other    Renal/GU      Musculoskeletal   Abdominal   Peds  Hematology   Anesthesia Other Findings   Reproductive/Obstetrics                          Anesthesia Physical Anesthesia Plan  ASA: III  Anesthesia Plan: General   Post-op Pain Management:    Induction: Intravenous  PONV Risk Score and Plan: 3 and Dexamethasone, Ondansetron and Treatment may vary due to age or medical condition  Airway Management Planned: Oral ETT  Additional Equipment: None  Intra-op Plan:   Post-operative Plan: Extubation in OR  Informed Consent: I have reviewed the patients History and Physical, chart, labs and discussed the procedure including the risks, benefits and alternatives for the proposed anesthesia with the patient or authorized representative who has indicated his/her understanding and acceptance.     Dental advisory given and Dental Advisory Given  Plan Discussed with:   Anesthesia Plan Comments: (PAT note by Karoline Caldwell, [name]: Follows with oncology for metastatic adenocarcinoma of the lung, mets to brain.  She is currently maintained on capmatinib. 12/29/2019: CT C/A/P showed interval decrease in size of mass involving the superior segment of right lower lobe and reduction in mediastinal and left supraclavicular adenopathy.  Oncology notes state she is  having ongoing pain control issues felt to be due to T8 vertebral fracture, and Dr. Isidore Moos recommended she see neurosurgery for consideration of kyphoplasty.  Has had difficulty with persistent N/V on chemo. Per notes, improving.   Patient had cardiac eval 2013 for atypical chest pain.  Ultimately had normal nuclear stress test, no ischemia, normal LV function.  Discussed recent CT chest showing new small-mod pericardial effusion with Dr. [name] Heck. He advised that she can proceed as planned barring acute status change. Will be evaluated DOS.   Preop labs reviewed, unremarkable.   EKG 09/29/19: Sinus tach. Rate 100. Borderline LAD. Abnormal r-wave progression, late transition.   CT chest 12/29/19: IMPRESSION: 1. Interval decrease in size of mass involving the superior segment of right lower lobe. 2. Interval reduction in mediastinal and left supraclavicular adenopathy. 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 The CT manifestation of these metastasis is new when compared with most recent cross-sectional imaging from 10/01/2019. 3. New small to moderate pericardial effusion. 4. Multi vessel coronary artery atherosclerotic calcifications. 5. Aortic atherosclerosis.  Nuclear stress 01/02/2012: Normal stress nuclear study.  LV Ejection Fraction: 64%.  LV Wall Motion:  NL LV Function; NL Wall Motion  )      Anesthesia Quick Evaluation

## 2020-03-15 ENCOUNTER — Observation Stay (HOSPITAL_COMMUNITY)
Admission: RE | Admit: 2020-03-15 | Discharge: 2020-03-16 | Disposition: A | Discharge status: Home / Self Care | Payer: BC Managed Care – PPO | Attending: Neurosurgery | Admitting: Neurosurgery

## 2020-03-15 ENCOUNTER — Encounter (HOSPITAL_COMMUNITY): Payer: Self-pay | Admitting: Neurosurgery

## 2020-03-15 ENCOUNTER — Ambulatory Visit (HOSPITAL_COMMUNITY): Payer: BC Managed Care – PPO | Admitting: Anesthesiology

## 2020-03-15 ENCOUNTER — Ambulatory Visit (HOSPITAL_COMMUNITY): Payer: BC Managed Care – PPO | Admitting: Physician Assistant

## 2020-03-15 ENCOUNTER — Ambulatory Visit (HOSPITAL_COMMUNITY): Payer: BC Managed Care – PPO

## 2020-03-15 ENCOUNTER — Encounter (HOSPITAL_COMMUNITY)
Admission: RE | Disposition: A | Discharge status: Home / Self Care | Payer: Self-pay | Source: Home / Self Care | Attending: Neurosurgery

## 2020-03-15 ENCOUNTER — Other Ambulatory Visit: Payer: Self-pay

## 2020-03-15 DIAGNOSIS — Z86718 Personal history of other venous thrombosis and embolism: Secondary | ICD-10-CM | POA: Diagnosis not present

## 2020-03-15 DIAGNOSIS — Z87891 Personal history of nicotine dependence: Secondary | ICD-10-CM | POA: Insufficient documentation

## 2020-03-15 DIAGNOSIS — Z79899 Other long term (current) drug therapy: Secondary | ICD-10-CM | POA: Insufficient documentation

## 2020-03-15 DIAGNOSIS — C7949 Secondary malignant neoplasm of other parts of nervous system: Secondary | ICD-10-CM | POA: Diagnosis not present

## 2020-03-15 DIAGNOSIS — C7951 Secondary malignant neoplasm of bone: Secondary | ICD-10-CM | POA: Insufficient documentation

## 2020-03-15 DIAGNOSIS — Z923 Personal history of irradiation: Secondary | ICD-10-CM | POA: Insufficient documentation

## 2020-03-15 DIAGNOSIS — I10 Essential (primary) hypertension: Secondary | ICD-10-CM | POA: Insufficient documentation

## 2020-03-15 DIAGNOSIS — Z419 Encounter for procedure for purposes other than remedying health state, unspecified: Secondary | ICD-10-CM

## 2020-03-15 DIAGNOSIS — K219 Gastro-esophageal reflux disease without esophagitis: Secondary | ICD-10-CM | POA: Diagnosis not present

## 2020-03-15 DIAGNOSIS — J449 Chronic obstructive pulmonary disease, unspecified: Secondary | ICD-10-CM | POA: Diagnosis not present

## 2020-03-15 DIAGNOSIS — E785 Hyperlipidemia, unspecified: Secondary | ICD-10-CM | POA: Diagnosis not present

## 2020-03-15 DIAGNOSIS — Z9889 Other specified postprocedural states: Secondary | ICD-10-CM | POA: Diagnosis not present

## 2020-03-15 DIAGNOSIS — Z9049 Acquired absence of other specified parts of digestive tract: Secondary | ICD-10-CM | POA: Diagnosis not present

## 2020-03-15 DIAGNOSIS — C7931 Secondary malignant neoplasm of brain: Secondary | ICD-10-CM | POA: Diagnosis not present

## 2020-03-15 DIAGNOSIS — I700 Atherosclerosis of aorta: Secondary | ICD-10-CM | POA: Diagnosis not present

## 2020-03-15 DIAGNOSIS — M4854XA Collapsed vertebra, not elsewhere classified, thoracic region, initial encounter for fracture: Secondary | ICD-10-CM | POA: Diagnosis not present

## 2020-03-15 DIAGNOSIS — Z7952 Long term (current) use of systemic steroids: Secondary | ICD-10-CM | POA: Insufficient documentation

## 2020-03-15 DIAGNOSIS — Z85118 Personal history of other malignant neoplasm of bronchus and lung: Secondary | ICD-10-CM | POA: Diagnosis not present

## 2020-03-15 DIAGNOSIS — I2510 Atherosclerotic heart disease of native coronary artery without angina pectoris: Secondary | ICD-10-CM | POA: Insufficient documentation

## 2020-03-15 DIAGNOSIS — C3490 Malignant neoplasm of unspecified part of unspecified bronchus or lung: Secondary | ICD-10-CM | POA: Diagnosis not present

## 2020-03-15 DIAGNOSIS — E7800 Pure hypercholesterolemia, unspecified: Secondary | ICD-10-CM | POA: Diagnosis not present

## 2020-03-15 DIAGNOSIS — M8458XG Pathological fracture in neoplastic disease, other specified site, subsequent encounter for fracture with delayed healing: Secondary | ICD-10-CM | POA: Diagnosis not present

## 2020-03-15 DIAGNOSIS — M8458XA Pathological fracture in neoplastic disease, other specified site, initial encounter for fracture: Secondary | ICD-10-CM | POA: Diagnosis not present

## 2020-03-15 HISTORY — PX: KYPHOPLASTY: SHX5884

## 2020-03-15 LAB — PROTIME-INR
INR: 1.2 (ref 0.8–1.2)
Prothrombin Time: 14.9 seconds (ref 11.4–15.2)

## 2020-03-15 SURGERY — KYPHOPLASTY
Anesthesia: General | Site: Spine Thoracic

## 2020-03-15 MED ORDER — SODIUM CHLORIDE 0.9 % IV SOLN
250.0000 mL | INTRAVENOUS | Status: DC
  Administered 2020-03-15: 250 mL via INTRAVENOUS

## 2020-03-15 MED ORDER — LIDOCAINE-EPINEPHRINE 1 %-1:100000 IJ SOLN
INTRAMUSCULAR | Status: AC
  Filled 2020-03-15: qty 1

## 2020-03-15 MED ORDER — BUPIVACAINE HCL (PF) 0.5 % IJ SOLN
INTRAMUSCULAR | Status: AC
  Filled 2020-03-15: qty 30

## 2020-03-15 MED ORDER — ACETAMINOPHEN 325 MG PO TABS: 650.0000 mg | ORAL_TABLET | ORAL | Status: DC | PRN

## 2020-03-15 MED ORDER — ONDANSETRON HCL 4 MG PO TABS: 4.0000 mg | ORAL_TABLET | Freq: Four times a day (QID) | ORAL | Status: DC | PRN

## 2020-03-15 MED ORDER — OXYCODONE HCL 5 MG PO TABS
5.0000 mg | ORAL_TABLET | ORAL | Status: DC | PRN
  Administered 2020-03-15: 5 mg via ORAL
  Filled 2020-03-15: qty 1

## 2020-03-15 MED ORDER — CYANOCOBALAMIN 500 MCG PO TABS
250.0000 ug | ORAL_TABLET | Freq: Every day | ORAL | Status: DC
  Filled 2020-03-15 (×2): qty 1

## 2020-03-15 MED ORDER — VITAMIN D 25 MCG (1000 UNIT) PO TABS: 2000.0000 [IU] | ORAL_TABLET | Freq: Every day | ORAL | Status: DC

## 2020-03-15 MED ORDER — GLYCERIN (LAXATIVE) 2.1 G RE SUPP
1.0000 | RECTAL | Status: DC | PRN
  Filled 2020-03-15: qty 1

## 2020-03-15 MED ORDER — ALUM & MAG HYDROXIDE-SIMETH 200-200-20 MG/5ML PO SUSP: 30.0000 mL | Freq: Four times a day (QID) | ORAL | Status: DC | PRN

## 2020-03-15 MED ORDER — MIDAZOLAM HCL 5 MG/5ML IJ SOLN
INTRAMUSCULAR | Status: DC | PRN
  Administered 2020-03-15: 2 mg via INTRAVENOUS

## 2020-03-15 MED ORDER — MIDAZOLAM HCL 2 MG/2ML IJ SOLN
INTRAMUSCULAR | Status: AC
  Filled 2020-03-15: qty 2

## 2020-03-15 MED ORDER — SODIUM CHLORIDE 0.9% FLUSH
3.0000 mL | Freq: Two times a day (BID) | INTRAVENOUS | Status: DC
  Administered 2020-03-15: 3 mL via INTRAVENOUS

## 2020-03-15 MED ORDER — DEXAMETHASONE SODIUM PHOSPHATE 10 MG/ML IJ SOLN
INTRAMUSCULAR | Status: DC | PRN
  Administered 2020-03-15: 5 mg via INTRAVENOUS

## 2020-03-15 MED ORDER — SENNOSIDES-DOCUSATE SODIUM 8.6-50 MG PO TABS: 2.0000 | ORAL_TABLET | Freq: Every day | ORAL | Status: DC

## 2020-03-15 MED ORDER — ROCURONIUM BROMIDE 10 MG/ML (PF) SYRINGE
PREFILLED_SYRINGE | INTRAVENOUS | Status: DC | PRN
  Administered 2020-03-15: 40 mg via INTRAVENOUS

## 2020-03-15 MED ORDER — ALBUTEROL SULFATE (2.5 MG/3ML) 0.083% IN NEBU: 2.5000 mg | INHALATION_SOLUTION | Freq: Four times a day (QID) | RESPIRATORY_TRACT | Status: DC | PRN

## 2020-03-15 MED ORDER — HYDROCODONE-ACETAMINOPHEN 5-325 MG PO TABS: 2.0000 | ORAL_TABLET | ORAL | Status: DC | PRN

## 2020-03-15 MED ORDER — KCL IN DEXTROSE-NACL 20-5-0.45 MEQ/L-%-% IV SOLN: INTRAVENOUS | Status: DC

## 2020-03-15 MED ORDER — LIDOCAINE 2% (20 MG/ML) 5 ML SYRINGE
INTRAMUSCULAR | Status: AC
  Filled 2020-03-15: qty 10

## 2020-03-15 MED ORDER — POLYETHYLENE GLYCOL 3350 17 G PO PACK: 17.0000 g | PACK | Freq: Every day | ORAL | Status: DC | PRN

## 2020-03-15 MED ORDER — FENTANYL CITRATE (PF) 100 MCG/2ML IJ SOLN
INTRAMUSCULAR | Status: DC | PRN
  Administered 2020-03-15: 100 ug via INTRAVENOUS
  Administered 2020-03-15: 50 ug via INTRAVENOUS
  Administered 2020-03-15: 25 ug via INTRAVENOUS

## 2020-03-15 MED ORDER — ALPRAZOLAM 0.25 MG PO TABS: 0.2500 mg | ORAL_TABLET | Freq: Two times a day (BID) | ORAL | Status: DC | PRN

## 2020-03-15 MED ORDER — IOPAMIDOL (ISOVUE-300) INJECTION 61%
INTRAVENOUS | Status: DC | PRN
  Administered 2020-03-15: 50 mL

## 2020-03-15 MED ORDER — CHLORHEXIDINE GLUCONATE 0.12 % MT SOLN: 15.0000 mL | Freq: Once | OROMUCOSAL | Status: AC

## 2020-03-15 MED ORDER — AIRBORNE PO CHEW: CHEWABLE_TABLET | Freq: Every day | ORAL | Status: DC

## 2020-03-15 MED ORDER — METHOCARBAMOL 500 MG PO TABS
500.0000 mg | ORAL_TABLET | Freq: Four times a day (QID) | ORAL | Status: DC | PRN
  Administered 2020-03-15: 500 mg via ORAL
  Filled 2020-03-15: qty 1

## 2020-03-15 MED ORDER — CHLORHEXIDINE GLUCONATE CLOTH 2 % EX PADS
6.0000 | MEDICATED_PAD | Freq: Once | CUTANEOUS | Status: AC
  Administered 2020-03-15: 6 via TOPICAL

## 2020-03-15 MED ORDER — EPHEDRINE 5 MG/ML INJ
INTRAVENOUS | Status: AC
  Filled 2020-03-15: qty 10

## 2020-03-15 MED ORDER — PHENYLEPHRINE HCL-NACL 10-0.9 MG/250ML-% IV SOLN
INTRAVENOUS | Status: DC | PRN
  Administered 2020-03-15: 50 ug/min via INTRAVENOUS

## 2020-03-15 MED ORDER — PROPOFOL 10 MG/ML IV BOLUS
INTRAVENOUS | Status: DC | PRN
  Administered 2020-03-15: 150 mg via INTRAVENOUS

## 2020-03-15 MED ORDER — ONDANSETRON HCL 4 MG/2ML IJ SOLN
INTRAMUSCULAR | Status: AC
  Filled 2020-03-15: qty 4

## 2020-03-15 MED ORDER — METHOCARBAMOL 1000 MG/10ML IJ SOLN
500.0000 mg | Freq: Four times a day (QID) | INTRAVENOUS | Status: DC | PRN
  Filled 2020-03-15: qty 5

## 2020-03-15 MED ORDER — PANTOPRAZOLE SODIUM 40 MG IV SOLR: 40.0000 mg | Freq: Every day | INTRAVENOUS | Status: DC

## 2020-03-15 MED ORDER — POLYETHYLENE GLYCOL 3350 17 G PO PACK: 17.0000 g | PACK | Freq: Two times a day (BID) | ORAL | Status: DC

## 2020-03-15 MED ORDER — PANTOPRAZOLE SODIUM 40 MG PO TBEC: 40.0000 mg | DELAYED_RELEASE_TABLET | Freq: Every day | ORAL | Status: DC

## 2020-03-15 MED ORDER — SENNOSIDES-DOCUSATE SODIUM 8.6-50 MG PO TABS: 3.0000 | ORAL_TABLET | Freq: Every day | ORAL | Status: DC

## 2020-03-15 MED ORDER — BISACODYL 10 MG RE SUPP: 10.0000 mg | Freq: Every day | RECTAL | Status: DC | PRN

## 2020-03-15 MED ORDER — HYDROMORPHONE HCL 1 MG/ML IJ SOLN: 0.5000 mg | INTRAMUSCULAR | Status: DC | PRN

## 2020-03-15 MED ORDER — LACTATED RINGERS IV SOLN: INTRAVENOUS | Status: DC | PRN

## 2020-03-15 MED ORDER — FENTANYL CITRATE (PF) 250 MCG/5ML IJ SOLN
INTRAMUSCULAR | Status: AC
  Filled 2020-03-15: qty 5

## 2020-03-15 MED ORDER — DOCUSATE SODIUM 100 MG PO CAPS: 100.0000 mg | ORAL_CAPSULE | Freq: Two times a day (BID) | ORAL | Status: DC

## 2020-03-15 MED ORDER — ROCURONIUM BROMIDE 10 MG/ML (PF) SYRINGE
PREFILLED_SYRINGE | INTRAVENOUS | Status: AC
  Filled 2020-03-15: qty 20

## 2020-03-15 MED ORDER — PHENOL 1.4 % MT LIQD: 1.0000 | OROMUCOSAL | Status: DC | PRN

## 2020-03-15 MED ORDER — BUPIVACAINE HCL (PF) 0.5 % IJ SOLN
INTRAMUSCULAR | Status: DC | PRN
  Administered 2020-03-15: 5 mL

## 2020-03-15 MED ORDER — CEFAZOLIN SODIUM-DEXTROSE 2-4 GM/100ML-% IV SOLN
INTRAVENOUS | Status: AC
  Filled 2020-03-15: qty 100

## 2020-03-15 MED ORDER — CEFAZOLIN SODIUM-DEXTROSE 2-4 GM/100ML-% IV SOLN
2.0000 g | Freq: Three times a day (TID) | INTRAVENOUS | Status: DC
  Administered 2020-03-15: 2 g via INTRAVENOUS
  Filled 2020-03-15: qty 100

## 2020-03-15 MED ORDER — OXYCODONE HCL 5 MG PO TABS: 5.0000 mg | ORAL_TABLET | Freq: Four times a day (QID) | ORAL | Status: DC | PRN

## 2020-03-15 MED ORDER — FLEET ENEMA 7-19 GM/118ML RE ENEM: 1.0000 | ENEMA | Freq: Once | RECTAL | Status: DC | PRN

## 2020-03-15 MED ORDER — CHLORHEXIDINE GLUCONATE 0.12 % MT SOLN
OROMUCOSAL | Status: AC
  Administered 2020-03-15: 15 mL via OROMUCOSAL
  Filled 2020-03-15: qty 15

## 2020-03-15 MED ORDER — PROPOFOL 10 MG/ML IV BOLUS
INTRAVENOUS | Status: AC
  Filled 2020-03-15: qty 20

## 2020-03-15 MED ORDER — MENTHOL 3 MG MT LOZG: 1.0000 | LOZENGE | OROMUCOSAL | Status: DC | PRN

## 2020-03-15 MED ORDER — OLANZAPINE 2.5 MG PO TABS
2.5000 mg | ORAL_TABLET | Freq: Every day | ORAL | Status: DC
  Filled 2020-03-15: qty 1

## 2020-03-15 MED ORDER — CHLORHEXIDINE GLUCONATE CLOTH 2 % EX PADS: 6.0000 | MEDICATED_PAD | Freq: Once | CUTANEOUS | Status: DC

## 2020-03-15 MED ORDER — SUGAMMADEX SODIUM 500 MG/5ML IV SOLN
INTRAVENOUS | Status: AC
  Filled 2020-03-15: qty 5

## 2020-03-15 MED ORDER — ZOLPIDEM TARTRATE 5 MG PO TABS: 5.0000 mg | ORAL_TABLET | Freq: Every evening | ORAL | Status: DC | PRN

## 2020-03-15 MED ORDER — FUROSEMIDE 20 MG PO TABS: 20.0000 mg | ORAL_TABLET | Freq: Every day | ORAL | Status: DC | PRN

## 2020-03-15 MED ORDER — SUGAMMADEX SODIUM 200 MG/2ML IV SOLN
INTRAVENOUS | Status: DC | PRN
Start: 2020-03-15 — End: 2020-03-15
  Administered 2020-03-15: 124.2 mg via INTRAVENOUS

## 2020-03-15 MED ORDER — AMISULPRIDE (ANTIEMETIC) 5 MG/2ML IV SOLN: 10.0000 mg | Freq: Once | INTRAVENOUS | Status: DC | PRN

## 2020-03-15 MED ORDER — LACTATED RINGERS IV SOLN: INTRAVENOUS | Status: DC

## 2020-03-15 MED ORDER — LIDOCAINE-EPINEPHRINE 1 %-1:100000 IJ SOLN
INTRAMUSCULAR | Status: DC | PRN
  Administered 2020-03-15: 5 mL

## 2020-03-15 MED ORDER — ACETAMINOPHEN 650 MG RE SUPP: 650.0000 mg | RECTAL | Status: DC | PRN

## 2020-03-15 MED ORDER — SODIUM CHLORIDE 0.9% FLUSH: 3.0000 mL | INTRAVENOUS | Status: DC | PRN

## 2020-03-15 MED ORDER — FENTANYL CITRATE (PF) 100 MCG/2ML IJ SOLN: 25.0000 ug | INTRAMUSCULAR | Status: DC | PRN

## 2020-03-15 MED ORDER — ORAL CARE MOUTH RINSE: 15.0000 mL | Freq: Once | OROMUCOSAL | Status: AC

## 2020-03-15 MED ORDER — ALBUTEROL SULFATE HFA 108 (90 BASE) MCG/ACT IN AERS: 2.0000 | INHALATION_SPRAY | Freq: Four times a day (QID) | RESPIRATORY_TRACT | Status: DC | PRN

## 2020-03-15 MED ORDER — LIDOCAINE 2% (20 MG/ML) 5 ML SYRINGE
INTRAMUSCULAR | Status: DC | PRN
  Administered 2020-03-15: 60 mg via INTRAVENOUS

## 2020-03-15 MED ORDER — PHENYLEPHRINE 40 MCG/ML (10ML) SYRINGE FOR IV PUSH (FOR BLOOD PRESSURE SUPPORT)
PREFILLED_SYRINGE | INTRAVENOUS | Status: AC
  Filled 2020-03-15: qty 10

## 2020-03-15 MED ORDER — CEFAZOLIN SODIUM-DEXTROSE 2-4 GM/100ML-% IV SOLN
2.0000 g | INTRAVENOUS | Status: AC
  Administered 2020-03-15: 2 g via INTRAVENOUS

## 2020-03-15 MED ORDER — 0.9 % SODIUM CHLORIDE (POUR BTL) OPTIME
TOPICAL | Status: DC | PRN
  Administered 2020-03-15: 1000 mL

## 2020-03-15 MED ORDER — ONDANSETRON HCL 4 MG/2ML IJ SOLN: 4.0000 mg | Freq: Four times a day (QID) | INTRAMUSCULAR | Status: DC | PRN

## 2020-03-15 MED ORDER — DEXAMETHASONE SODIUM PHOSPHATE 10 MG/ML IJ SOLN
INTRAMUSCULAR | Status: AC
  Filled 2020-03-15: qty 2

## 2020-03-15 MED ORDER — ACETAMINOPHEN 500 MG PO TABS: 1000.0000 mg | ORAL_TABLET | Freq: Once | ORAL | Status: DC

## 2020-03-15 SURGICAL SUPPLY — 47 items
ADH SKN CLS APL DERMABOND .7 (GAUZE/BANDAGES/DRESSINGS) ×1
BLADE CLIPPER SURG (BLADE) IMPLANT
BLADE SURG 15 STRL LF DISP TIS (BLADE) ×1 IMPLANT
BLADE SURG 15 STRL SS (BLADE) ×2
BNDG GAUZE ELAST 4 BULKY (GAUZE/BANDAGES/DRESSINGS) ×1 IMPLANT
CARTRIDGE OIL MAESTRO DRILL (MISCELLANEOUS) ×1 IMPLANT
CEMENT KYPHON CX01A KIT/MIXER (Cement) ×1 IMPLANT
COVER WAND RF STERILE (DRAPES) ×1 IMPLANT
DECANTER SPIKE VIAL GLASS SM (MISCELLANEOUS) ×2 IMPLANT
DERMABOND ADVANCED (GAUZE/BANDAGES/DRESSINGS) ×1
DERMABOND ADVANCED .7 DNX12 (GAUZE/BANDAGES/DRESSINGS) ×1 IMPLANT
DIFFUSER DRILL AIR PNEUMATIC (MISCELLANEOUS) ×1 IMPLANT
DRAPE C-ARM 42X72 X-RAY (DRAPES) ×2 IMPLANT
DRAPE HALF SHEET 40X57 (DRAPES) ×2 IMPLANT
DRAPE LAPAROTOMY 100X72X124 (DRAPES) ×2 IMPLANT
DRAPE SURG 17X23 STRL (DRAPES) ×1 IMPLANT
DRAPE WARM FLUID 44X44 (DRAPES) ×2 IMPLANT
DURAPREP 26ML APPLICATOR (WOUND CARE) ×2 IMPLANT
GAUZE 4X4 16PLY RFD (DISPOSABLE) ×2 IMPLANT
GLOVE BIO SURGEON STRL SZ8 (GLOVE) ×3 IMPLANT
GLOVE BIOGEL PI IND STRL 6.5 (GLOVE) IMPLANT
GLOVE BIOGEL PI IND STRL 8.5 (GLOVE) ×1 IMPLANT
GLOVE BIOGEL PI INDICATOR 6.5 (GLOVE) ×2
GLOVE BIOGEL PI INDICATOR 8.5 (GLOVE) ×1
GLOVE ECLIPSE 7.0 STRL STRAW (GLOVE) ×1 IMPLANT
GLOVE EXAM NITRILE XL STR (GLOVE) IMPLANT
GLOVE SURG SS PI 6.0 STRL IVOR (GLOVE) ×2 IMPLANT
GOWN STRL REUS W/ TWL LRG LVL3 (GOWN DISPOSABLE) IMPLANT
GOWN STRL REUS W/ TWL XL LVL3 (GOWN DISPOSABLE) IMPLANT
GOWN STRL REUS W/TWL 2XL LVL3 (GOWN DISPOSABLE) IMPLANT
GOWN STRL REUS W/TWL LRG LVL3 (GOWN DISPOSABLE) ×4
GOWN STRL REUS W/TWL XL LVL3 (GOWN DISPOSABLE) ×2
KIT BASIN OR (CUSTOM PROCEDURE TRAY) ×2 IMPLANT
KIT TURNOVER KIT B (KITS) ×2 IMPLANT
NDL HYPO 25X1 1.5 SAFETY (NEEDLE) ×1 IMPLANT
NEEDLE HYPO 25X1 1.5 SAFETY (NEEDLE) ×2 IMPLANT
NS IRRIG 1000ML POUR BTL (IV SOLUTION) ×2 IMPLANT
OIL CARTRIDGE MAESTRO DRILL (MISCELLANEOUS)
PACK SURGICAL SETUP 50X90 (CUSTOM PROCEDURE TRAY) ×2 IMPLANT
PAD ARMBOARD 7.5X6 YLW CONV (MISCELLANEOUS) ×8 IMPLANT
STAPLER SKIN PROX WIDE 3.9 (STAPLE) ×2 IMPLANT
SUT VIC AB 3-0 SH 8-18 (SUTURE) ×2 IMPLANT
SYR CONTROL 10ML LL (SYRINGE) ×3 IMPLANT
TOWEL GREEN STERILE (TOWEL DISPOSABLE) ×2 IMPLANT
TOWEL GREEN STERILE FF (TOWEL DISPOSABLE) ×2 IMPLANT
TRAY KYPHOPAK 15/2 EXPRESS CDS (KITS) ×1 IMPLANT
TRAY KYPHOPAK 20/3 ONESTEP 1ST (MISCELLANEOUS) IMPLANT

## 2020-03-15 NOTE — Transfer of Care (Signed)
Immediate Anesthesia Transfer of Care Note  Patient: KIMBREE CASANAS  Procedure(s) Performed: Thoracic Eight KYPHOPLASTY (N/A Spine Thoracic)  Patient Location: PACU  Anesthesia Type:General  Level of Consciousness: awake, alert  and oriented  Airway & Oxygen Therapy: Patient Spontanous Breathing and Patient connected to nasal cannula oxygen  Post-op Assessment: Report given to RN, Post -op Vital signs reviewed and stable and Patient moving all extremities X 4  Post vital signs: Reviewed and stable  Last Vitals:  Vitals Value Taken Time  BP 123/78 03/15/20 1408  Temp    Pulse 99 03/15/20 1409  Resp 14 03/15/20 1409  SpO2 100 % 03/15/20 1409  Vitals shown include unvalidated device data.  Last Pain:  Vitals:   03/15/20 1145  TempSrc:   PainSc: 0-No pain         Complications: No complications documented.

## 2020-03-15 NOTE — Op Note (Signed)
03/15/2020  2:03 PM  PATIENT:  Virginia [name]  64 y.o. 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   It was elected to take her to surgery for kyphoplasty procedure.  PROCEDURE:  Following the smooth and uncomplicated induction of general endotracheal anesthesia, the patient was placed in a prone position on chest rolls.  C-arm fluoroscopy was positioned in both the AP and lateral planes, centered on the T 8 vertebra.  Her back was prepped and draped in the usual sterile fashion with Duraprep.  Using a bipedicular approach, the T 8 pedicle  and vertebral body were entered with the trochar using standard landmarks, initially on the right, but subsequently on the left as well. .  The drill was used, followed by a 15 cc Kyphon balloon, which was used to re-expand the broken vertebra.  Filling was at high pressures.  We then placed a balloon on the left as well.  Subsequently, 6 cc of bone cement was placed into the void created by the balloon and was seen to fill the fracture cleft and fill the vertebra in both the AP and lateral direction with good interdigitation and without apparent  extravasation. We filled on the left as well, but bone cement extravasated to extra-vertebral location (inferiorly and away from canal) at relatively low pressure, so we elected to terminate fill.  The bone void fillers were then removed.  Final X-ray demonstrated good filling within the fractured vertebra.  The incision was closed with two3-0 vicryl stitches and dressed with Dermabond. The patient was returned to the [facility] and extubated in the OR and taken to Recovery in stable and satisfactory condition, having tolerated the procedure well.  Counts were correct at the end of the case.   PLAN OF CARE: Admit for overnight observation  PATIENT DISPOSITION:  PACU - hemodynamically stable.   Delay start of Pharmacological VTE agent (>24hrs) due to surgical blood loss or risk of bleeding: yes

## 2020-03-15 NOTE — H&P (Signed)
Patient ID:   [id number] Patient: Virginia [name]  Date of Birth: Dec 14, 1955 Visit Type: Office Visit   Date: 02/29/2020 09:00 AM Provider: Earleen Newport MD   This 64 year old female presents for Back pain.  HISTORY OF PRESENT ILLNESS: 1.  Back pain  Virginia [name], 64 yr old female and pt of Dr. Donnella Bi prior to his retirement, visits for evaluation of upper/mid-back pain and T8 compression fracture/spine mets.   She reports pain with standing long periods, relieved somewhat with use of heating pad.  Oxycodone 5mg  OxyContin 15mg   Eliquis   Hx: Lung CA with brain and spine mets, HTN, hypercholesterolemia, GERD, DVT's LLE Feb 2021(Eliquis) SxHx: cholecystectomy, hysterectomy, lipoma resection, SRS brain (2021)  April 2021 CT and today's x-ray on [place]      Medical/Surgical/Interim History Reviewed, no change.     PAST MEDICAL HISTORY, SURGICAL HISTORY, FAMILY HISTORY, SOCIAL HISTORY AND REVIEW OF SYSTEMS I have reviewed the patient's past medical, surgical, family and social history as well as the comprehensive review of systems as included on the [facility] history form dated 11/03/2019, which I have signed.  Family History: Reviewed, no changes.    Social History: Reviewed, no changes.   MEDICATIONS: (added, continued or stopped this visit) Started Medication Directions Instruction Stopped  Delsym Cough Plus Day-Night 12.5 mg-5 mg-325 mg/10 mL(nt) oral liquid take as needed    dexamethasone 2 mg tablet take 1 tablet by oral route 2 times every day    hydrochlorothiazide 25 mg tablet take 1 tablet by oral route  every day    losartan 100 mg tablet take 1 tablet by oral route  every day    oxycodone 5 mg tablet take 1 tablet by oral route  every 6 hours as needed    pantoprazole sodium take 1 tablet by oral route  every day    rosuvastatin 5 mg tablet take 1 tablet by oral route 3 times every  week    Tylenol Extra Strength 500 mg tablet take 2 tablet by oral route  every 6 hours as needed    Ventolin HFA 90 mcg/actuation aerosol inhaler inhale 2 puff by inhalation route  every 4 hours as needed      ALLERGIES: Ingredient Reaction Medication Name Comment NO KNOWN ALLERGIES    No known allergies. Reviewed, no changes.    PHYSICAL EXAM:  Vitals Date Temp F BP Pulse Ht In Wt Lb BMI BSA Pain Score 02/29/2020  119/88 96 62 139.2 25.46  4/10     IMPRESSION:  The patient has a suppression fracture T8.  He has a history of lung cancer and spinal metastases.  She has had radiation treatments.  She has bone loss on the left side of the vertebra.  Her pain is not improving with rest and time  Comments:  Needs to be cleared to stop Eliquis pre-op  PLAN: She wishes to go ahead with kyphoplasty procedure and we have set this up in the near future for the T8 level.  Orders: Diagnostic Procedures: Assessment Procedure M54.6 Thoracic Spine- AP/Lat S22.000G Thoracic Spine- AP/Lat Instruction(s)/Education: Assessment Instruction I10 Lifestyle education [phone] Dietary management education, guidance, and counseling  Completed Orders (this encounter) Order Details Reason Side Interpretation Result Initial Treatment Date Region Lifestyle education Patient will follow up with Primary Care Physician.       Dietary management education, guidance, and counseling Encouraged patient to eat well balanced diet.       Thoracic Spine- AP/Lat  02/29/2020 T8 to T8  Assessment/Plan  # Detail Type Description  1. Assessment Metastatic cancer to spine (C79.51).     2. Assessment Closed compression fracture of thoracic vertebra with delayed healing, subsequent encounter (S22.000G).     3. Assessment Thoracic back pain, unspecified back pain laterality, unspecified chronicity (M54.6).      4. Assessment Essential (primary) hypertension (I10).     5. Assessment Body mass index (BMI) 25.0-25.9, adult (O37.85).  Plan Orders Today's instructions / counseling include(s) Dietary management education, guidance, and counseling. Clinical information/comments: Encouraged patient to eat well balanced diet.       Pain Management Plan Pain Scale: 4/10. Method: Numeric Pain Intensity Scale. Location: back. Onset: 09/11/2019. Duration: varies. Quality: discomforting. Pain management follow-up plan of care: Patient will continue medication management..              Provider:  Earleen Newport MD  03/15/2020 12:47 PM    Dictation edited by: Marchia Meiers. Vertell Limber    CC Providers: [facility] [address] [place],  [place]  [id number]-   Sarah Squire  [place], [place] [address]

## 2020-03-15 NOTE — Discharge Instructions (Signed)
Wound Care Leave incision open to air. You may shower. Do not scrub directly on incision.  Do not put any creams, lotions, or ointments on incision. Activity Walk each and every day, increasing distance each day. No lifting greater than 5 lbs.  Avoid bending, arching, and twisting.  Diet Resume your normal diet.   Call Your Doctor If Any of These Occur Redness, drainage, or swelling at the wound.  Temperature greater than 101 degrees. Severe pain not relieved by pain medication. Incision starts to come apart. Follow Up Appt Call for any

## 2020-03-15 NOTE — Evaluation (Signed)
Physical Therapy Evaluation Patient Details Name: Virginia [name] MRN: [id number] DOB: Aug 26, 1956 Today's Date: 03/15/2020   History of Present Illness  Pt is 64 yo female with PMH of lung cancer with bone mets.  Pt with compression fx t8 with delayed healing.  She is now s/p T8 kyphoplasty on 03/15/20.  Clinical Impression  Pt admitted with above dx.  She demonstrated good understanding of back precautions.  She was able to transfer and ambulate with supervision and stairs with min guard for safety.  Pt with good family support.  No acute PT indicated.  Pt safe to d/c home with spouse from PT perspective.      Follow Up Recommendations No PT follow up    Equipment Recommendations  None recommended by PT    Recommendations for Other Services       Precautions / Restrictions Precautions Precautions: Back Precaution Booklet Issued: Yes (comment)      Mobility  Bed Mobility Overal bed mobility: Needs Assistance Bed Mobility: Supine to Sit;Sit to Supine     Supine to sit: Supervision Sit to supine: Supervision   General bed mobility comments: cued for log roll technique; demonstrated x 2  Transfers Overall transfer level: Needs assistance Equipment used: None Transfers: Sit to/from Stand Sit to Stand: Supervision            Ambulation/Gait Ambulation/Gait assistance: Supervision Gait Distance (Feet): 300 Feet Assistive device: None Gait Pattern/deviations: WFL(Within Functional Limits)     General Gait Details: steady; no lOB  Stairs Stairs: Yes Stairs assistance: Min guard Stair Management: One rail Right;Forwards;Alternating pattern Number of Stairs: 15 General stair comments: performed 12 with rail and R and 3 with HHA  Wheelchair Mobility    Modified Rankin (Stroke Patients Only)       Balance Overall balance assessment: Needs assistance Sitting-balance support: No upper extremity supported Sitting balance-Leahy Scale: Normal     Standing  balance support: No upper extremity supported Standing balance-Leahy Scale: Good                               Pertinent Vitals/Pain Pain Assessment: 0-10 Pain Score: 2  Pain Location: back Pain Descriptors / Indicators: Sore Pain Intervention(s): Limited activity within patient's tolerance    Home Living Family/patient expects to be discharged to:: Private residence Living Arrangements: Spouse/significant other Available Help at Discharge: Family;Available 24 hours/day Type of Home: House Home Access: Stairs to enter Entrance Stairs-Rails: None Entrance Stairs-Number of Steps: 4 Home Layout: Two level;Bed/bath upstairs Home Equipment: None      Prior Function Level of Independence: Independent               Hand Dominance        Extremity/Trunk Assessment   Upper Extremity Assessment Upper Extremity Assessment: Overall WFL for tasks assessed    Lower Extremity Assessment Lower Extremity Assessment: Overall WFL for tasks assessed    Cervical / Trunk Assessment Cervical / Trunk Assessment: Normal  Communication   Communication: No difficulties  Cognition Arousal/Alertness: Awake/alert Behavior During Therapy: WFL for tasks assessed/performed Overall Cognitive Status: Within Functional Limits for tasks assessed                                        General Comments General comments (skin integrity, edema, etc.): Pt educated on back precautions and given handout.  Advised showers and driving per doctor orders.  Discussed safe car transfers    Exercises     Assessment/Plan    PT Assessment Patent does not need any further PT services  PT Problem List         PT Treatment Interventions      PT Goals (Current goals can be found in the Care Plan section)  Acute Rehab PT Goals Patient Stated Goal: return home PT Goal Formulation: All assessment and education complete, DC therapy Time For Goal Achievement:  03/15/20 Potential to Achieve Goals: Good    Frequency     Barriers to discharge        Co-evaluation               AM-PAC PT "6 Clicks" Mobility  Outcome Measure Help needed turning from your back to your side while in a flat bed without using bedrails?: None Help needed moving from lying on your back to sitting on the side of a flat bed without using bedrails?: None Help needed moving to and from a bed to a chair (including a wheelchair)?: None Help needed standing up from a chair using your arms (e.g., wheelchair or bedside chair)?: None Help needed to walk in hospital room?: None Help needed climbing 3-5 steps with a railing? : None 6 Click Score: 24    End of Session Equipment Utilized During Treatment: Gait belt Activity Tolerance: Patient tolerated treatment well Patient left: in bed;with call bell/phone within reach Nurse Communication: Mobility status      Time: 1655-3748 PT Time Calculation (min) (ACUTE ONLY): 22 min   Charges:   PT Evaluation $PT Eval Low Complexity: 1 Low          Johnita Palleschi, PT Acute Rehab Services Pager [phone] [facility] 03/15/2020, 5:12 PM

## 2020-03-15 NOTE — Brief Op Note (Signed)
03/15/2020  2:03 PM  PATIENT:  Virginia [name] Height  64 y.o. 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   It was elected to take her to surgery for kyphoplasty procedure.  PROCEDURE:  Following the smooth and uncomplicated induction of general endotracheal anesthesia, the patient was placed in a prone position on chest rolls.  C-arm fluoroscopy was positioned in both the AP and lateral planes, centered on the T 8 vertebra.  Her back was prepped and draped in the usual sterile fashion with Duraprep.  Using a bipedicular approach, the T 8 pedicle  and vertebral body were entered with the trochar using standard landmarks, initially on the right, but subsequently on the left as well. .  The drill was used, followed by a 15 cc Kyphon balloon, which was used to re-expand the broken vertebra.  Filling was at high pressures.  We then placed a balloon on the left as well.  Subsequently, 6 cc of bone cement was placed into the void created by the balloon and was seen to fill the fracture cleft and fill the vertebra in both the AP and lateral direction with good interdigitation and without apparent  extravasation. We filled on the left as well, but bone cement extravasated to extra-vertebral location (inferiorly and away from canal) at relatively low pressure, so we elected to terminate fill.  The bone void fillers were then removed.  Final X-ray demonstrated good filling within the fractured vertebra.  The incision was closed with two3-0 vicryl stitches and dressed with Dermabond. The patient was returned to the [facility] and extubated in the OR and taken to Recovery in stable and satisfactory condition, having tolerated the procedure well.  Counts were correct at the end of the case.   PLAN OF CARE: Admit for overnight observation  PATIENT DISPOSITION:  PACU - hemodynamically stable.   Delay start of Pharmacological VTE agent (>24hrs) due to surgical blood loss or risk of bleeding: yes

## 2020-03-15 NOTE — Anesthesia Procedure Notes (Signed)
Procedure Name: Intubation Date/Time: 03/15/2020 12:42 PM Performed by: Neldon Newport, CRNA Pre-anesthesia Checklist: Timeout performed, Patient being monitored, Suction available, Emergency Drugs available and Patient identified Patient Re-evaluated:Patient Re-evaluated prior to induction Oxygen Delivery Method: Circle system utilized Preoxygenation: Pre-oxygenation with 100% oxygen Induction Type: IV induction Ventilation: Mask ventilation without difficulty Laryngoscope Size: Mac and 3 Grade View: Grade I Tube type: Oral Tube size: 7.0 mm Number of attempts: 1 Placement Confirmation: ETT inserted through vocal cords under direct vision,  positive ETCO2 and breath sounds checked- equal and bilateral Secured at: 21 cm Tube secured with: Tape Dental Injury: Teeth and Oropharynx as per pre-operative assessment

## 2020-03-15 NOTE — Interval H&P Note (Signed)
History and Physical Interval Note:  03/15/2020 12:48 PM  [name]  has presented today for surgery, with the diagnosis of Closed compression fracture of thoracic vertebra with delayed healing.  The various methods of treatment have been discussed with the patient and family. After consideration of risks, benefits and other options for treatment, the patient has consented to  Procedure(s) with comments: Thoracic Eight KYPHOPLASTY (N/A) - prone  as a surgical intervention.  The patient's history has been reviewed, patient examined, no change in status, stable for surgery.  I have reviewed the patient's chart and labs.  Questions were answered to the patient's satisfaction.     Peggyann Shoals

## 2020-03-16 ENCOUNTER — Encounter (HOSPITAL_COMMUNITY): Payer: Self-pay | Admitting: Neurosurgery

## 2020-03-16 DIAGNOSIS — C7951 Secondary malignant neoplasm of bone: Secondary | ICD-10-CM | POA: Diagnosis not present

## 2020-03-16 DIAGNOSIS — Z79899 Other long term (current) drug therapy: Secondary | ICD-10-CM | POA: Diagnosis not present

## 2020-03-16 DIAGNOSIS — C7931 Secondary malignant neoplasm of brain: Secondary | ICD-10-CM | POA: Diagnosis not present

## 2020-03-16 DIAGNOSIS — Z923 Personal history of irradiation: Secondary | ICD-10-CM | POA: Diagnosis not present

## 2020-03-16 DIAGNOSIS — Z9049 Acquired absence of other specified parts of digestive tract: Secondary | ICD-10-CM | POA: Diagnosis not present

## 2020-03-16 DIAGNOSIS — I700 Atherosclerosis of aorta: Secondary | ICD-10-CM | POA: Diagnosis not present

## 2020-03-16 DIAGNOSIS — I2510 Atherosclerotic heart disease of native coronary artery without angina pectoris: Secondary | ICD-10-CM | POA: Diagnosis not present

## 2020-03-16 DIAGNOSIS — I10 Essential (primary) hypertension: Secondary | ICD-10-CM | POA: Diagnosis not present

## 2020-03-16 DIAGNOSIS — M4854XA Collapsed vertebra, not elsewhere classified, thoracic region, initial encounter for fracture: Secondary | ICD-10-CM | POA: Diagnosis not present

## 2020-03-16 DIAGNOSIS — K219 Gastro-esophageal reflux disease without esophagitis: Secondary | ICD-10-CM | POA: Diagnosis not present

## 2020-03-16 DIAGNOSIS — Z87891 Personal history of nicotine dependence: Secondary | ICD-10-CM | POA: Diagnosis not present

## 2020-03-16 DIAGNOSIS — Z7952 Long term (current) use of systemic steroids: Secondary | ICD-10-CM | POA: Diagnosis not present

## 2020-03-16 DIAGNOSIS — E7800 Pure hypercholesterolemia, unspecified: Secondary | ICD-10-CM | POA: Diagnosis not present

## 2020-03-16 DIAGNOSIS — Z86718 Personal history of other venous thrombosis and embolism: Secondary | ICD-10-CM | POA: Diagnosis not present

## 2020-03-16 DIAGNOSIS — C3490 Malignant neoplasm of unspecified part of unspecified bronchus or lung: Secondary | ICD-10-CM | POA: Diagnosis not present

## 2020-03-16 DIAGNOSIS — J449 Chronic obstructive pulmonary disease, unspecified: Secondary | ICD-10-CM | POA: Diagnosis not present

## 2020-03-16 MED FILL — METHOCARBAMOL 500 MG TABS: 500 | 20 days supply | Qty: 60 | Fill #0

## 2020-03-16 NOTE — Anesthesia Postprocedure Evaluation (Signed)
Anesthesia Post Note  Patient: Virginia [name]  Procedure(s) Performed: Thoracic Eight KYPHOPLASTY (N/A Spine Thoracic)     Patient location during evaluation: PACU Anesthesia Type: General Level of consciousness: awake and alert Pain management: pain level controlled Vital Signs Assessment: post-procedure vital signs reviewed and stable Respiratory status: spontaneous breathing, nonlabored ventilation, respiratory function stable and patient connected to nasal cannula oxygen Cardiovascular status: blood pressure returned to baseline and stable Postop Assessment: no apparent nausea or vomiting Anesthetic complications: no   No complications documented.  Last Vitals:  Vitals:   03/15/20 1509 03/15/20 1710  BP: 132/88 120/85  Pulse: 85 (!) 110  Resp: 19 16  Temp: 37.1 C 37.2 C  SpO2: 100% 99%    Last Pain:  Vitals:   03/15/20 1905  TempSrc:   PainSc: 4                  Tiajuana Amass

## 2020-03-16 NOTE — Discharge Summary (Signed)
Physician Discharge Summary  Patient ID: Virginia [name] MRN: [id number] DOB/AGE: 10/18/1955 64 y.o.  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 rate 16, height 5\' 2"  (1.575 m), weight 62.1 kg, SpO2 99 %. Neurologic: Alert and oriented X 3, normal strength and tone. Normal symmetric reflexes. Normal coordination and gait Wound:CDI  Disposition: Home  Discharge Instructions     Remove dressing in 72 hours   Complete by: As directed    Diet - low sodium heart healthy   Complete by: As directed    Increase activity slowly   Complete by: As directed      Allergies as of 03/16/2020   No Known Allergies     Medication List    TAKE these medications   AIRBORNE PO Take 1 tablet by mouth daily.   albuterol 108 (90 Base) MCG/ACT inhaler Commonly known as: VENTOLIN HFA Inhale 2 puffs into the lungs every 6 (six) hours as needed for wheezing or shortness of breath.   ALPRAZolam 0.25 MG tablet Commonly known as: XANAX Take 1 tablet (0.25 mg total) by mouth 2 (two) times daily as needed. for anxiety What changed:   reasons to take this  additional instructions   capmatinib 200 MG tablet Commonly known as: Tabrecta TAKE 2 TABLETS (400 MG TOTAL) BY MOUTH 2 (TWO) TIMES DAILY. What changed:   how much to take  how to take this  when to take this  additional instructions   Eliquis 5 MG Tabs tablet Generic  drug: apixaban TAKE ONE TABLET BY MOUTH TWICE A DAY What changed: how much to take   furosemide 20 MG tablet Commonly known as: Lasix 20 mg once daily for 3 days, then stop. May repeat if edema recurs. What changed:   how much to take  how to take this  when to take this  additional instructions   glycerin adult 2 g suppository Place 1 suppository rectally as needed for constipation.   lidocaine-prilocaine cream Commonly known as: EMLA Apply 1 application topically as needed. Apply to port as needed. What changed:   reasons to take this  additional instructions   ofloxacin 0.3 % ophthalmic solution Commonly known as: Ocuflox 2 drops each eye 4 times a day for 5 days   OLANZapine 2.5 MG tablet Commonly known as: ZYPREXA Take 1 tablet (2.5 mg total) by mouth at bedtime.   oxyCODONE 5 MG immediate release tablet Commonly known as: Oxy IR/ROXICODONE TAKE 1 TO 2 TABLETS BY MOUTH EVERY 4 HOURS AS NEEDED FOR SEVERE OR BREAKTHOUGH PAIN What changed: See the new instructions.   pantoprazole 40 MG tablet Commonly known as: PROTONIX TAKE 1 TABLET(40 MG) BY MOUTH DAILY What changed: See the new instructions.   polyethylene glycol 17 g packet Commonly known as: MIRALAX / GLYCOLAX Take 17 g by mouth 2 (two) times daily.   prochlorperazine 10 MG tablet Commonly known as: COMPAZINE TAKE 1 TABLET BY MOUTH EVERY 6 HOURS AS NEEDED FOR NAUSEA OR VOMITING   sennosides-docusate sodium  8.6-50 MG tablet Commonly known as: SENOKOT-S Take 2-3 tablets by mouth See admin instructions. 2 in the morning and 3 at night. Adjust as needed   sucralfate 1 g tablet Commonly known as: Carafate Dissolve 1 tablet in 10 mL H20 and swallow 30 min prior to meals and bedtime.   VITAMIN B12 PO Take 2 tablets by mouth daily. Gummies   Vitamin D3 25 MCG tablet Commonly known as: Vitamin D Take 2,000 Units by mouth daily.       Follow-up Information    Erline Levine, MD Follow up.    Specialty: Neurosurgery Contact information: [address] [place] [address] [phone]               Signed: Peggyann Shoals, MD 03/16/2020, 8:33 AM

## 2020-03-16 NOTE — Progress Notes (Signed)
Patient alert and oriented, mae's well, voiding adequate amount of urine, swallowing without difficulty, no c/o pain at time of discharge. Patient discharged home with family. Script and discharged instructions given to patient. Patient and family stated understanding of instructions given. Patient has an appointment with Dr.Stern    

## 2020-03-19 ENCOUNTER — Encounter (HOSPITAL_COMMUNITY): Payer: Self-pay | Admitting: Emergency Medicine

## 2020-03-19 ENCOUNTER — Other Ambulatory Visit: Payer: Self-pay

## 2020-03-19 ENCOUNTER — Emergency Department (HOSPITAL_COMMUNITY)
Admission: EM | Admit: 2020-03-19 | Discharge: 2020-03-19 | Disposition: A | Discharge status: Home / Self Care | Payer: BC Managed Care – PPO | Attending: Emergency Medicine | Admitting: Emergency Medicine

## 2020-03-19 ENCOUNTER — Emergency Department (HOSPITAL_BASED_OUTPATIENT_CLINIC_OR_DEPARTMENT_OTHER): Payer: BC Managed Care – PPO

## 2020-03-19 DIAGNOSIS — R600 Localized edema: Secondary | ICD-10-CM | POA: Diagnosis not present

## 2020-03-19 DIAGNOSIS — I10 Essential (primary) hypertension: Secondary | ICD-10-CM | POA: Insufficient documentation

## 2020-03-19 DIAGNOSIS — Z86718 Personal history of other venous thrombosis and embolism: Secondary | ICD-10-CM

## 2020-03-19 DIAGNOSIS — Z79899 Other long term (current) drug therapy: Secondary | ICD-10-CM | POA: Diagnosis not present

## 2020-03-19 DIAGNOSIS — M79604 Pain in right leg: Secondary | ICD-10-CM | POA: Diagnosis not present

## 2020-03-19 DIAGNOSIS — Z87891 Personal history of nicotine dependence: Secondary | ICD-10-CM | POA: Diagnosis not present

## 2020-03-19 DIAGNOSIS — R609 Edema, unspecified: Secondary | ICD-10-CM

## 2020-03-19 DIAGNOSIS — J449 Chronic obstructive pulmonary disease, unspecified: Secondary | ICD-10-CM | POA: Insufficient documentation

## 2020-03-19 NOTE — Discharge Instructions (Signed)
As discussed, your ultrasound was negative for any blood clots.  Continue to take your blood thinners as prescribed.  You may take over-the-counter Tylenol as needed for pain.  Please follow-up with your PCP if symptoms not improved within the next week.  Return to the ER for new or worsening symptoms.

## 2020-03-19 NOTE — ED Triage Notes (Signed)
Pt reports cancer patient with hx blood clots. Reports posterior right leg pain that feels similar of blood clot. Took 2 extra Eliquis this morning.

## 2020-03-19 NOTE — ED Provider Notes (Signed)
[facility] DEPT Provider Note   CSN: [id number] Arrival date & time: 03/19/20  1248     History Chief Complaint  Patient presents with  . Leg Pain    Virginia [name] is a 64 y.o. 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  Patient states she took 1 extra Eliquis last night and this morning due to concerns about a possible DVT.  Patient states pain is directly behind her right knee and worse with movement.  Denies injury to right knee.  Denies chest pain and shortness of breath.  Patient has been compliant with her Eliquis however, had a spinal injection on 7/6 which required her to stop taking Eliquis 2 days prior.  Patient states she resumed her Eliquis 1 day after the injection.  Admits to chronic lower extremity edema due to her chemotherapy with no change in characteristics since her right lower extremity pain began yesterday.  Denies fever and chills.  History obtained from patient and past medical records. No interpreter used during encounter.      Past Medical History:  Diagnosis Date  . Anemia   . Anxiety   . Concussion 09/28/2019  . DVT (deep venous thrombosis) ([place]) 2021   L leg  . Dyspnea   . GERD (gastroesophageal reflux disease)   . Hypercholesterolemia    per pt, she does not have elevated lipids  . Hypertension   . met lung ca dx'd 09/2019   mets to spine, hip and brain  . PONV (postoperative nausea and vomiting)   . Tobacco abuse     Patient Active Problem List   Diagnosis Date Noted  . 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 Goals of care, counseling/discussion 02/25/2020  . Palliative care patient 12/16/2019  . Bone  metastases ([place]) 10/21/2019  . Brain metastases ([place]) 10/15/2019  . Right lower lobe lung mass 09/25/2019  . COPD (chronic obstructive pulmonary disease) ([place]) 09/25/2019  . Impaired glucose tolerance 12/08/2017  . Vitamin D deficiency 12/08/2017  . Microscopic hematuria 04/01/2014  . Hypertension 01/11/2012  . Palpitations 01/11/2012  . Hyperlipidemia 12/25/2011  . History of smoking 12/25/2011    Past Surgical History:  Procedure Laterality Date  . ABDOMINAL HYSTERECTOMY     partial/ left ovaries  . CHOLECYSTECTOMY    . DILATION AND CURETTAGE OF UTERUS    . IR IMAGING GUIDED PORT INSERTION  10/23/2019  . KYPHOPLASTY N/A 03/15/2020   Procedure: Thoracic Eight KYPHOPLASTY;  Surgeon: Erline Levine, MD;  Location: [facility];  Service: Neurosurgery;  Laterality: N/A;  prone   . LIPOMA EXCISION  2018   removed under left breast and right thigh.  . TUBAL LIGATION       OB History   No obstetric history on file.     Family History  Problem Relation Age of Onset  . Heart disease Father   . Drug abuse Daughter   . Drug abuse Son   . Cancer Sister   . Heart disease Brother   . Heart attack Brother     Social History   Tobacco Use  . Smoking status: Former Smoker    Packs/day: 0.25    Quit date: 10/31/2012    Years  since quitting: 7.3  . Smokeless tobacco: Never Used  Vaping Use  . Vaping Use: Never used  Substance Use Topics  . Alcohol use: Yes    Alcohol/week: 0.0 standard drinks    Comment: rare  . Drug use: No    Home Medications Prior to Admission medications   Medication Sig Start Date End Date Taking? Authorizing Provider  albuterol (VENTOLIN HFA) 108 (90 Base) MCG/ACT inhaler Inhale 2 puffs into the lungs every 6 (six) hours as needed for wheezing or shortness of breath. 10/30/19   Elby Showers, MD  ALPRAZolam Duanne Moron) 0.25 MG tablet Take 1 tablet (0.25 mg total) by mouth 2 (two) times daily as needed. for anxiety Patient taking differently: Take 0.25 mg by  mouth 2 (two) times daily as needed for anxiety.  02/23/20   Elby Showers, MD  capmatinib (TABRECTA) 200 MG tablet TAKE 2 TABLETS (400 MG TOTAL) BY MOUTH 2 (TWO) TIMES DAILY. Patient taking differently: Take 400 mg by mouth 2 (two) times daily.  02/25/20   Orson Slick, MD  Cyanocobalamin (VITAMIN B12 PO) Take 2 tablets by mouth daily. Gummies    [provider]  ELIQUIS 5 MG TABS tablet TAKE ONE TABLET BY MOUTH TWICE A DAY Patient taking differently: Take 5 mg by mouth 2 (two) times daily.  02/19/20   Orson Slick, MD  furosemide (LASIX) 20 MG tablet 20 mg once daily for 3 days, then stop. May repeat if edema recurs. Patient taking differently: Take 20 mg by mouth See admin instructions. 20 mg once daily for 3 days, then stop. Daily as needed  May repeat if edema recurs. 12/23/19   Orson Slick, MD  glycerin adult 2 g suppository Place 1 suppository rectally as needed for constipation. 01/15/20   Orson Slick, MD  lidocaine-prilocaine (EMLA) cream Apply 1 application topically as needed. Apply to port as needed. Patient taking differently: Apply 1 application topically as needed (Apply to port as needed.).  11/03/19   Orson Slick, MD  Multiple Vitamins-Minerals (AIRBORNE PO) Take 1 tablet by mouth daily.     [provider]  ofloxacin (OCUFLOX) 0.3 % ophthalmic solution 2 drops each eye 4 times a day for 5 days Patient not taking: Reported on 03/09/2020 02/23/20   Elby Showers, MD  OLANZapine (ZYPREXA) 2.5 MG tablet Take 1 tablet (2.5 mg total) by mouth at bedtime. 02/25/20   Orson Slick, MD  oxyCODONE (OXY IR/ROXICODONE) 5 MG immediate release tablet TAKE 1 TO 2 TABLETS BY MOUTH EVERY 4 HOURS AS NEEDED FOR SEVERE OR BREAKTHOUGH PAIN Patient taking differently: Take 5 mg by mouth See admin instructions. Every 4.5 hours daily 01/30/20   Orson Slick, MD  pantoprazole (PROTONIX) 40 MG tablet TAKE 1 TABLET(40 MG) BY MOUTH DAILY Patient taking  differently: Take 40 mg by mouth daily.  05/27/19   Elby Showers, MD  polyethylene glycol (MIRALAX / GLYCOLAX) 17 g packet Take 17 g by mouth 2 (two) times daily.     [provider]  prochlorperazine (COMPAZINE) 10 MG tablet TAKE 1 TABLET BY MOUTH EVERY 6 HOURS AS NEEDED FOR NAUSEA OR VOMITING Patient not taking: Reported on 02/25/2020 01/25/20   Orson Slick, MD  sennosides-docusate sodium (SENOKOT-S) 8.6-50 MG tablet Take 2-3 tablets by mouth See admin instructions. 2 in the morning and 3 at night. Adjust as needed    [provider]  sucralfate (  CARAFATE) 1 g tablet Dissolve 1 tablet in 10 mL H20 and swallow 30 min prior to meals and bedtime. Patient not taking: Reported on 02/25/2020 11/03/19   [name], MD  Vitamin D3 (VITAMIN D) 25 MCG tablet Take 2,000 Units by mouth daily.    [provider]    Allergies    Patient has no known allergies.  Review of Systems   Review of Systems  Constitutional: Negative for chills and fever.  Respiratory: Negative for shortness of breath.   Cardiovascular: Positive for leg swelling. Negative for chest pain.  Musculoskeletal: Positive for arthralgias.  All other systems reviewed and are negative.   Physical Exam Updated Vital Signs BP (!) 130/91 (BP Location: Left Arm)   Pulse (!) 103   Temp 98.1 F (36.7 C) (Oral)   Resp 18   SpO2 96%   Physical Exam Vitals and nursing note reviewed.  Constitutional:      General: She is not in acute distress.    Appearance: She is not ill-appearing.  HENT:     Head: Normocephalic.  Eyes:     Pupils: Pupils are equal, round, and reactive to light.  Cardiovascular:     Rate and Rhythm: Normal rate and regular rhythm.     Pulses: Normal pulses.     Heart sounds: Normal heart sounds. No murmur heard.  No friction rub. No gallop.   Pulmonary:     Effort: Pulmonary effort is normal.     Breath sounds: Normal breath sounds.  Abdominal:     General: Abdomen is flat.  There is no distension.     Palpations: Abdomen is soft.     Tenderness: There is no abdominal tenderness. There is no guarding or rebound.  Musculoskeletal:     Cervical back: Neck supple.     Comments: Bilateral lower extremity edema. Full extension and flexion of right knee. RLE neurovascularly intact.   Skin:    General: Skin is warm and dry.  Neurological:     General: No focal deficit present.     Mental Status: She is alert.  Psychiatric:        Mood and Affect: Mood normal.        Behavior: Behavior normal.     ED Results / Procedures / Treatments   Labs (all labs ordered are listed, but only abnormal results are displayed) Labs Reviewed - No data to display  EKG None  Radiology VAS [name] LOWER EXTREMITY VENOUS (DVT) (ONLY [facility] & [facility] 7a-7p)  Result Date: 03/19/2020  Lower Venous DVTStudy Indications: Edema, and History of left lower extremity DVT.  Comparison Study: 12/24/2019- age indeterminate left PTV and peroneal vein DVT Performing Technologist: Maudry Mayhew MHA, RDMS, RVT, RDCS  Examination Guidelines: A complete evaluation includes B-mode imaging, spectral Doppler, color Doppler, and power Doppler as needed of all accessible portions of each vessel. Bilateral testing is considered an integral part of a complete examination. Limited examinations for reoccurring indications may be performed as noted. The reflux portion of the exam is performed with the patient in reverse Trendelenburg.  +---------+---------------+---------+-----------+----------+--------------+ RIGHT    CompressibilityPhasicitySpontaneityPropertiesThrombus Aging +---------+---------------+---------+-----------+----------+--------------+ CFV      Full           Yes                                          +---------+---------------+---------+-----------+----------+--------------+ SFJ  Full                                                         +---------+---------------+---------+-----------+----------+--------------+ FV Prox  Full                                                        +---------+---------------+---------+-----------+----------+--------------+ FV Mid   Full                                                        +---------+---------------+---------+-----------+----------+--------------+ FV DistalFull                                                        +---------+---------------+---------+-----------+----------+--------------+ PFV      Full                                                        +---------+---------------+---------+-----------+----------+--------------+ POP      Full           Yes      Yes                                 +---------+---------------+---------+-----------+----------+--------------+ PTV      Full                                                        +---------+---------------+---------+-----------+----------+--------------+ PERO     Full                                                        +---------+---------------+---------+-----------+----------+--------------+   Right Technical Findings: Not visualized segments include Limited visualization right peroneal veins.  +----+---------------+---------+-----------+----------+--------------+ LEFTCompressibilityPhasicitySpontaneityPropertiesThrombus Aging +----+---------------+---------+-----------+----------+--------------+ CFV Full           Yes      Yes                                 +----+---------------+---------+-----------+----------+--------------+     Summary: RIGHT: - There is no evidence of deep vein thrombosis in the lower extremity. However, portions of this examination were limited- see technologist comments above.  - No cystic structure found in the popliteal fossa.  LEFT: - No evidence of common  femoral vein obstruction.  *See table(s) above for measurements and observations.     Preliminary     Procedures Procedures (including critical care time)  Medications Ordered in ED Medications - No data to display  ED Course  I have reviewed the triage vital signs and the nursing notes.  Pertinent labs & imaging results that were available during my care of the patient were reviewed by me and considered in my medical decision making (see chart for details).  Clinical Course as of Mar 20 1451  Sat Mar 19, 2020  1330 Pulse Rate(!): 103 [CA]    Clinical Course User Index [CA] Karie Kirks   MDM Rules/Calculators/A&P                         64 year old female presents to the ED due to sudden onset of right lower extremity pain located behind her right knee that started last night.  Patient has a history of DVT.  She has been compliant with her Eliquis however, had to stop it 2 days prior to spinal injections on 7/6 and resumed the day after.  Denies chest pain and shortness of breath.  Stable vitals.  Triage noted patient to be tachycardic at 103 however, during my initial evaluation patient's heart rate in the 90s.  Patient in no acute distress and non-ill-appearing.  Physical exam significant for bilateral lower extremity edema.  Very mild tenderness behind right knee.  Full extension and flexion of right knee.  RLE neurovascularly intact. Will obtain right lower extremity ultrasound to rule out DVT.   [name] personally reviewed which demonstrates: Summary:  RIGHT:  - There is no evidence of deep vein thrombosis in the lower extremity. However, portions of this examination were limited- see technologist comments above.    - No cystic structure found in the popliteal fossa.    LEFT:  - No evidence of common femoral vein obstruction.    *See table(s) above for measurements and observations.   Ace wrap placed here in the ED. Low suspicion for septic arthritis given full ROM of right knee. Suspect MSK etiology. Instructed patient to take over the counter  Tylenol as needed for pain management. Advised patient to follow-up with PCP if symptoms do not improve within the next week. Strict ED precautions discussed with patient. Patient states understanding and agrees to plan. Patient discharged home in no acute distress and stable vitals.  Final Clinical Impression(s) / ED Diagnoses Final diagnoses:  Right leg pain    Rx / DC Orders ED Discharge Orders    None       Karie Kirks 03/19/20 1453    Quintella Reichert, MD 03/19/20 1537

## 2020-03-19 NOTE — Progress Notes (Signed)
Right lower extremity venous duplex completed. Refer to "CV Proc" under chart review to view preliminary results.  03/19/2020 2:17 PM Kelby Aline., MHA, RVT, RDCS, RDMS

## 2020-03-19 NOTE — ED Notes (Signed)
Patient refused ace wrap

## 2020-03-21 ENCOUNTER — Inpatient Hospital Stay: Payer: BC Managed Care – PPO | Attending: Hematology and Oncology

## 2020-03-21 ENCOUNTER — Other Ambulatory Visit: Payer: Self-pay | Admitting: *Deleted

## 2020-03-21 DIAGNOSIS — M549 Dorsalgia, unspecified: Secondary | ICD-10-CM | POA: Insufficient documentation

## 2020-03-21 DIAGNOSIS — Z7901 Long term (current) use of anticoagulants: Secondary | ICD-10-CM | POA: Insufficient documentation

## 2020-03-21 DIAGNOSIS — C7931 Secondary malignant neoplasm of brain: Secondary | ICD-10-CM

## 2020-03-21 DIAGNOSIS — R519 Headache, unspecified: Secondary | ICD-10-CM | POA: Insufficient documentation

## 2020-03-21 DIAGNOSIS — R112 Nausea with vomiting, unspecified: Secondary | ICD-10-CM | POA: Insufficient documentation

## 2020-03-21 DIAGNOSIS — C3431 Malignant neoplasm of lower lobe, right bronchus or lung: Secondary | ICD-10-CM | POA: Insufficient documentation

## 2020-03-21 DIAGNOSIS — Z86718 Personal history of other venous thrombosis and embolism: Secondary | ICD-10-CM | POA: Insufficient documentation

## 2020-03-21 DIAGNOSIS — M7989 Other specified soft tissue disorders: Secondary | ICD-10-CM | POA: Insufficient documentation

## 2020-03-21 DIAGNOSIS — Z79899 Other long term (current) drug therapy: Secondary | ICD-10-CM | POA: Insufficient documentation

## 2020-03-21 DIAGNOSIS — Z5111 Encounter for antineoplastic chemotherapy: Secondary | ICD-10-CM | POA: Insufficient documentation

## 2020-03-21 DIAGNOSIS — R590 Localized enlarged lymph nodes: Secondary | ICD-10-CM | POA: Insufficient documentation

## 2020-03-21 DIAGNOSIS — Z9049 Acquired absence of other specified parts of digestive tract: Secondary | ICD-10-CM | POA: Insufficient documentation

## 2020-03-21 DIAGNOSIS — I700 Atherosclerosis of aorta: Secondary | ICD-10-CM | POA: Insufficient documentation

## 2020-03-21 DIAGNOSIS — Z5112 Encounter for antineoplastic immunotherapy: Secondary | ICD-10-CM | POA: Insufficient documentation

## 2020-03-21 DIAGNOSIS — R5383 Other fatigue: Secondary | ICD-10-CM | POA: Insufficient documentation

## 2020-03-21 DIAGNOSIS — E876 Hypokalemia: Secondary | ICD-10-CM | POA: Insufficient documentation

## 2020-03-22 ENCOUNTER — Other Ambulatory Visit: Payer: Self-pay | Admitting: Hematology and Oncology

## 2020-03-22 ENCOUNTER — Other Ambulatory Visit: Payer: Self-pay

## 2020-03-22 ENCOUNTER — Ambulatory Visit (HOSPITAL_COMMUNITY)
Admission: RE | Admit: 2020-03-22 | Discharge: 2020-03-22 | Disposition: A | Discharge status: Home / Self Care | Payer: BC Managed Care – PPO | Source: Ambulatory Visit | Attending: Hematology and Oncology | Admitting: Hematology and Oncology

## 2020-03-22 DIAGNOSIS — C3491 Malignant neoplasm of unspecified part of right bronchus or lung: Secondary | ICD-10-CM | POA: Diagnosis not present

## 2020-03-22 MED ORDER — IOHEXOL 300 MG/ML  SOLN
100.0000 mL | Freq: Once | INTRAMUSCULAR | Status: AC | PRN
  Administered 2020-03-22: 100 mL via INTRAVENOUS

## 2020-03-22 MED ORDER — SODIUM CHLORIDE (PF) 0.9 % IJ SOLN
INTRAMUSCULAR | Status: AC
  Filled 2020-03-22: qty 50

## 2020-03-23 ENCOUNTER — Other Ambulatory Visit: Payer: Self-pay | Admitting: Hematology and Oncology

## 2020-03-23 MED ORDER — OXYCODONE HCL 5 MG PO TABS: ORAL_TABLET | ORAL | 0 refills | Status: DC

## 2020-03-23 MED FILL — oxyCODONE HCL 5 MG TABS: 5 | 8 days supply | Qty: 90 | Fill #0

## 2020-03-24 DIAGNOSIS — M546 Pain in thoracic spine: Secondary | ICD-10-CM | POA: Diagnosis not present

## 2020-03-24 DIAGNOSIS — C7931 Secondary malignant neoplasm of brain: Secondary | ICD-10-CM | POA: Diagnosis not present

## 2020-03-24 DIAGNOSIS — S22000G Wedge compression fracture of unspecified thoracic vertebra, subsequent encounter for fracture with delayed healing: Secondary | ICD-10-CM | POA: Diagnosis not present

## 2020-03-24 DIAGNOSIS — C7951 Secondary malignant neoplasm of bone: Secondary | ICD-10-CM | POA: Diagnosis not present

## 2020-03-24 MED FILL — XTAMPZA ER 13.5 MG C12A: 13.5 | 30 days supply | Qty: 60 | Fill #0

## 2020-03-25 ENCOUNTER — Other Ambulatory Visit: Payer: Self-pay

## 2020-03-25 ENCOUNTER — Inpatient Hospital Stay: Payer: BC Managed Care – PPO

## 2020-03-25 ENCOUNTER — Inpatient Hospital Stay: Payer: BC Managed Care – PPO | Admitting: Hematology and Oncology

## 2020-03-25 ENCOUNTER — Other Ambulatory Visit: Payer: Self-pay | Admitting: Hematology and Oncology

## 2020-03-25 VITALS — BP 111/82 | HR 91 | Temp 99.0°F | Resp 18 | Ht 62.0 in | Wt 140.0 lb

## 2020-03-25 DIAGNOSIS — Z86718 Personal history of other venous thrombosis and embolism: Secondary | ICD-10-CM | POA: Diagnosis not present

## 2020-03-25 DIAGNOSIS — C7931 Secondary malignant neoplasm of brain: Secondary | ICD-10-CM

## 2020-03-25 DIAGNOSIS — C3491 Malignant neoplasm of unspecified part of right bronchus or lung: Secondary | ICD-10-CM

## 2020-03-25 DIAGNOSIS — Z7901 Long term (current) use of anticoagulants: Secondary | ICD-10-CM | POA: Diagnosis not present

## 2020-03-25 DIAGNOSIS — C3490 Malignant neoplasm of unspecified part of unspecified bronchus or lung: Secondary | ICD-10-CM

## 2020-03-25 DIAGNOSIS — I700 Atherosclerosis of aorta: Secondary | ICD-10-CM | POA: Diagnosis not present

## 2020-03-25 DIAGNOSIS — C7951 Secondary malignant neoplasm of bone: Secondary | ICD-10-CM | POA: Diagnosis not present

## 2020-03-25 DIAGNOSIS — Z79899 Other long term (current) drug therapy: Secondary | ICD-10-CM | POA: Diagnosis not present

## 2020-03-25 DIAGNOSIS — Z5112 Encounter for antineoplastic immunotherapy: Secondary | ICD-10-CM | POA: Diagnosis not present

## 2020-03-25 DIAGNOSIS — Z9049 Acquired absence of other specified parts of digestive tract: Secondary | ICD-10-CM | POA: Diagnosis not present

## 2020-03-25 DIAGNOSIS — R519 Headache, unspecified: Secondary | ICD-10-CM | POA: Diagnosis not present

## 2020-03-25 DIAGNOSIS — R112 Nausea with vomiting, unspecified: Secondary | ICD-10-CM | POA: Diagnosis not present

## 2020-03-25 DIAGNOSIS — M549 Dorsalgia, unspecified: Secondary | ICD-10-CM | POA: Diagnosis not present

## 2020-03-25 DIAGNOSIS — Z5111 Encounter for antineoplastic chemotherapy: Secondary | ICD-10-CM | POA: Diagnosis not present

## 2020-03-25 DIAGNOSIS — M7989 Other specified soft tissue disorders: Secondary | ICD-10-CM | POA: Diagnosis not present

## 2020-03-25 DIAGNOSIS — Z95828 Presence of other vascular implants and grafts: Secondary | ICD-10-CM

## 2020-03-25 DIAGNOSIS — R5383 Other fatigue: Secondary | ICD-10-CM | POA: Diagnosis not present

## 2020-03-25 DIAGNOSIS — C3431 Malignant neoplasm of lower lobe, right bronchus or lung: Secondary | ICD-10-CM | POA: Diagnosis not present

## 2020-03-25 DIAGNOSIS — R590 Localized enlarged lymph nodes: Secondary | ICD-10-CM | POA: Diagnosis not present

## 2020-03-25 DIAGNOSIS — E876 Hypokalemia: Secondary | ICD-10-CM | POA: Diagnosis not present

## 2020-03-25 LAB — CMP (CANCER CENTER ONLY)
ALT: 14 U/L (ref 0–44)
AST: 12 U/L — ABNORMAL LOW (ref 15–41)
Albumin: 2.7 g/dL — ABNORMAL LOW (ref 3.5–5.0)
Alkaline Phosphatase: 75 U/L (ref 38–126)
Anion gap: 7 (ref 5–15)
BUN: 12 mg/dL (ref 8–23)
CO2: 25 mmol/L (ref 22–32)
Calcium: 8.5 mg/dL — ABNORMAL LOW (ref 8.9–10.3)
Chloride: 109 mmol/L (ref 98–111)
Creatinine: 0.65 mg/dL (ref 0.44–1.00)
GFR, Est AFR Am: >60 mL/min (ref >60)
GFR, Estimated: >60 mL/min (ref >60)
Glucose, Bld: 87 mg/dL (ref 70–99)
Potassium: 3.9 mmol/L (ref 3.5–5.1)
Sodium: 141 mmol/L (ref 135–145)
Total Bilirubin: 0.4 mg/dL (ref 0.3–1.2)
Total Protein: 5.4 g/dL — ABNORMAL LOW (ref 6.5–8.1)

## 2020-03-25 LAB — CBC WITH DIFFERENTIAL (CANCER CENTER ONLY)
Abs Immature Granulocytes: 0.02 10*3/uL (ref 0.00–0.07)
Basophils Absolute: 0 10*3/uL (ref 0.0–0.1)
Basophils Relative: 0 %
Eosinophils Absolute: 0.2 10*3/uL (ref 0.0–0.5)
Eosinophils Relative: 4 %
HCT: 33.9 % — ABNORMAL LOW (ref 36.0–46.0)
Hemoglobin: 11.6 g/dL — ABNORMAL LOW (ref 12.0–15.0)
Immature Granulocytes: 0 %
Lymphocytes Relative: 20 %
Lymphs Abs: 1.2 10*3/uL (ref 0.7–4.0)
MCH: 31.7 pg (ref 26.0–34.0)
MCHC: 34.2 g/dL (ref 30.0–36.0)
MCV: 92.6 fL (ref 80.0–100.0)
Monocytes Absolute: 0.6 10*3/uL (ref 0.1–1.0)
Monocytes Relative: 10 %
Neutro Abs: 4 10*3/uL (ref 1.7–7.7)
Neutrophils Relative %: 66 %
Platelet Count: 279 10*3/uL (ref 150–400)
RBC: 3.66 MIL/uL — ABNORMAL LOW (ref 3.87–5.11)
RDW: 12.8 % (ref 11.5–15.5)
WBC Count: 6.1 10*3/uL (ref 4.0–10.5)
nRBC: 0 % (ref 0.0–0.2)

## 2020-03-25 LAB — LACTATE DEHYDROGENASE: LDH: 166 U/L (ref 98–192)

## 2020-03-25 LAB — TSH: TSH: 1.1 u[IU]/mL (ref 0.308–3.960)

## 2020-03-25 MED ORDER — HEPARIN SOD (PORK) LOCK FLUSH 100 UNIT/ML IV SOLN
500.0000 [IU] | INTRAVENOUS | Status: AC | PRN
  Administered 2020-03-25: 500 [IU]
  Filled 2020-03-25: qty 5

## 2020-03-25 MED ORDER — SODIUM CHLORIDE 0.9% FLUSH
10.0000 mL | INTRAVENOUS | Status: AC | PRN
  Administered 2020-03-25: 10 mL
  Filled 2020-03-25: qty 10

## 2020-03-25 NOTE — Progress Notes (Signed)
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 CT of the head showed showed concerning for metastatic disease involving the left cerebellum and right occipital lobe.  2) 09/30/2019: MRI brain confirms mass within the left cerebellum measures 1.6 x 1.3 x 1.5 cm as well as at least 8 metastatic lesions within the supratentorial and infratentorial brain as outlined 3) 10/01/2019: PET CT scan revealed hypermetabolic infrahilar right lower lobe mass with hypermetabolic nodal metastases in the right hilum, mediastinum, right supraclavicular region, left axilla and lower left neck. 4) 10/08/2019: US guided biopsy of the lymph nodes confirms poorly differentiated non-small cell carcinoma. Immunohistochemistry confirms adenocarcinoma of lung primary. PD-L1 and NGS later revealed a MET Exon 14 mutation and TPS of 90%.  5) 10/12/2019: Establish care with Dr. Lorenso Courier  6) 11/04/2019: Day 1 of capmatinib '400mg'$  BID 7) 11/09/2019: presented to Rad/Onc with a DVT in LLE. Seen in symptom management clinic and started on apixaban.  8) 12/29/2019: CT C/A/P showed interval decrease in size of mass involving the superior segment of right lower lobe and reduction in mediastinal and left supraclavicular adenopathy though some small spinal lesions were noted in the interim between the two sets of scans 9) 01/25/2020: temporarily stopped capmatinib due to symptoms of nausea/fatigue. Restarted therapy on 01/30/2020 after brief chemo holiday.  10) 03/22/2020: CT C/A/P reveals a mixed picture, with new lung nodule and  increased lymphadenopathy, however response in bone lesions. D/c capmatinib therapy 11) 04/06/2020: intended start of Carbo/Pem/Pem for 2nd line treatment.   Interval History:  Virginia [name] 64 y.o. female with medical history significant for metastatic adenocarcinoma of the lung presents for a follow up visit. The patient's last visit was on 02/25/2020 at which time she was continued on capmatinib therapy. In the interim since the last visit she has had a CT scan which revealed area of progression on therapy.   On exam today Virginia [name] notes her nausea has continued to be well controlled on olanzapine therapy.  She has not required any additional as needed's and has not required Compazine or Zofran as supplement.  She notes however that she is having worsening back pain after the T8 kyphoplasty on 03/15/2020.  She notes that the pain is "worse than before".  She notes that she is taking her p.o. oxycodone every 4-1/2 hours and is about to start taking a long-acting opioid as prescribed by IV neurosurgical team.  She notes that her spine pain while sitting in the office today is 2 out of 10 in severity, however with mobility yesterday it was as severe as 9 out of 10.  On further discussion she notes that her anticoagulant therapy has been well-tolerated and that she has not had any issues with bleeding, bruising, or dark stools.  She notes that her leg swelling is stable and she still does have some uncomfortable sensation in the right leg (which is not the one that has the clot in it).  Overall her health is stable as compared to her last visit.  On further discussing this he notes denies having any issues with fevers, chills, sweats, diarrhea, or constipation.  She notes that her breathing has been good and she has not had any issues with cough, shortness of breath, or chest pain.  A full 10 point ROS is listed below.  MEDICAL HISTORY:  Past Medical History:  Diagnosis Date  . Anemia   . Anxiety    . Concussion 09/28/2019  . DVT (deep venous thrombosis) ([place]) 2021   L leg  . Dyspnea   . GERD (gastroesophageal reflux disease)   . Hypercholesterolemia    per pt, she does not have elevated lipids  . Hypertension   . met lung ca dx'd 09/2019   mets to spine, hip and brain  . PONV (postoperative nausea and vomiting)   . Tobacco abuse     SURGICAL HISTORY: Past Surgical History:  Procedure Laterality Date  . ABDOMINAL HYSTERECTOMY     partial/ left ovaries  . CHOLECYSTECTOMY    . DILATION AND CURETTAGE OF UTERUS    . IR IMAGING GUIDED PORT INSERTION  10/23/2019  . KYPHOPLASTY N/A 03/15/2020   Procedure: Thoracic Eight KYPHOPLASTY;  Surgeon: Erline Levine, MD;  Location: [facility];  Service: Neurosurgery;  Laterality: N/A;  prone   . LIPOMA EXCISION  2018   removed under left breast and right thigh.  . TUBAL LIGATION      ALLERGIES:  has No Known Allergies.  MEDICATIONS:  Current Outpatient Medications  Medication Sig Dispense Refill  . albuterol (VENTOLIN HFA) 108 (90 Base) MCG/ACT inhaler Inhale 2 puffs into the lungs every 6 (six) hours as needed for wheezing or shortness of breath. 6.7 g 11  . ALPRAZolam (XANAX) 0.25 MG tablet Take 1 tablet (0.25 mg total) by mouth 2 (two) times daily as needed. for anxiety (Patient taking differently: Take 0.25 mg by mouth 2 (two) times daily as needed for anxiety. ) 60 tablet 5  . capmatinib (TABRECTA) 200 MG tablet TAKE 2 TABLETS (400 MG TOTAL) BY MOUTH 2 (TWO) TIMES DAILY. (Patient taking differently: Take 400 mg by mouth 2 (two) times daily. ) 56 tablet 3  . Cyanocobalamin (VITAMIN B12 PO) Take 2 tablets by mouth daily. Gummies    . ELIQUIS 5 MG TABS tablet TAKE ONE TABLET BY MOUTH TWICE A DAY (Patient taking differently: Take 5 mg by mouth 2 (two) times daily. ) 60 tablet 1  . furosemide (LASIX) 20 MG tablet 20 mg once daily for 3 days, then stop. May repeat if edema recurs. (Patient taking differently: Take 20 mg by mouth See admin  instructions. 20 mg once daily for 3 days, then stop. Daily as needed  May repeat if edema recurs.) 30 tablet 1  . glycerin adult 2 g suppository Place 1 suppository rectally as needed for constipation. 12 suppository 0  . lidocaine-prilocaine (EMLA) cream Apply 1 application topically as needed. Apply to port as needed. (Patient taking differently: Apply 1 application topically as needed (Apply to port as needed.). ) 30 g 0  . methocarbamol (ROBAXIN) 500 MG tablet Take 500 mg by mouth 3 (three) times daily as needed.    . Multiple Vitamins-Minerals (AIRBORNE PO) Take 1 tablet by mouth daily.     Marland Kitchen ofloxacin (OCUFLOX) 0.3 % ophthalmic solution 2 drops each eye 4 times a day for 5 days (Patient not taking: Reported on 03/09/2020) 5 mL 1  . OLANZapine (ZYPREXA) 2.5 MG tablet Take 1 tablet (2.5 mg total) by mouth at bedtime. 30 tablet 1  . oxyCODONE (OXY IR/ROXICODONE) 5 MG immediate release tablet TAKE 1 TO 2 TABLETS BY  MOUTH EVERY 4 HOURS AS NEEDED FOR SEVERE OR BREAKTHOUGH PAIN 90 tablet 0  . pantoprazole (PROTONIX) 40 MG tablet TAKE 1 TABLET(40 MG) BY MOUTH DAILY (Patient taking differently: Take 40 mg by mouth daily. ) 90 tablet 3  . polyethylene glycol (MIRALAX / GLYCOLAX) 17 g packet Take 17 g by mouth 2 (two) times daily.     . prochlorperazine (COMPAZINE) 10 MG tablet TAKE 1 TABLET BY MOUTH EVERY 6 HOURS AS NEEDED FOR NAUSEA OR VOMITING (Patient not taking: Reported on 02/25/2020) 30 tablet 0  . sennosides-docusate sodium (SENOKOT-S) 8.6-50 MG tablet Take 2-3 tablets by mouth See admin instructions. 2 in the morning and 3 at night. Adjust as needed    . sucralfate (CARAFATE) 1 g tablet Dissolve 1 tablet in 10 mL H20 and swallow 30 min prior to meals and bedtime. (Patient not taking: Reported on 02/25/2020) 50 tablet 3  . Vitamin D3 (VITAMIN D) 25 MCG tablet Take 2,000 Units by mouth daily.    Marland Kitchen XTAMPZA ER 13.5 MG C12A Take 1 capsule by mouth every 12 (twelve) hours.     No current  facility-administered medications for this visit.    REVIEW OF SYSTEMS:   Constitutional: ( - ) fevers, ( - )  chills , ( - ) night sweats Eyes: ( - ) blurriness of vision, ( - ) double vision, ( - ) watery eyes Ears, nose, mouth, throat, and face: ( - ) mucositis, ( - ) sore throat Respiratory: ( - ) cough, ( - ) dyspnea, ( - ) wheezes Cardiovascular: ( - ) palpitation, ( - ) chest discomfort, ( - ) lower extremity swelling Gastrointestinal:  ( - ) nausea, ( - ) heartburn, ( - ) change in bowel habits Skin: ( - ) abnormal skin rashes Lymphatics: ( - ) new lymphadenopathy, ( - ) easy bruising Neurological: ( - ) numbness, ( - ) tingling, ( - ) new weaknesses Behavioral/Psych: ( - ) mood change, ( - ) new changes  All other systems were reviewed with the patient and are negative.  PHYSICAL EXAMINATION: ECOG PERFORMANCE STATUS: 1 - Symptomatic but completely ambulatory  Vitals:   03/25/20 1042  BP: 111/82  Pulse: 91  Resp: 18  Temp: 99 F (37.2 C)  SpO2: 100%   Filed Weights   03/25/20 1042  Weight: 140 lb (63.5 kg)    GENERAL: well appearing middle aged Caucasian female in NAD  SKIN: skin color, texture, turgor are normal, no rashes or significant lesions EYES: conjunctiva are pink and non-injected, sclera clear LUNGS: clear to auscultation and percussion with normal breathing effort HEART: regular rate & rhythm and no murmurs and bilateral + 1 pitting lower extremity edema Musculoskeletal: no cyanosis of digits and no clubbing. Some tenderness on palpation of right leg.  PSYCH: alert & oriented x 3, fluent speech NEURO: no focal motor/sensory deficits   LABORATORY DATA:  I have reviewed the data as listed CBC Latest Ref Rng & Units 03/25/2020 03/10/2020 02/25/2020  WBC 4.0 - 10.5 K/uL 6.1 7.0 6.0  Hemoglobin 12.0 - 15.0 g/dL 11.6(L) 12.4 11.5(L)  Hematocrit 36 - 46 % 33.9(L) 37.9 33.7(L)  Platelets 150 - 400 K/uL 279 310 261    CMP Latest Ref Rng & Units 03/25/2020  03/10/2020 02/25/2020  Glucose 70 - 99 mg/dL 87 123(H) 96  BUN 8 - 23 mg/dL '12 10 12  '$ Creatinine 0.44 - 1.00 mg/dL 0.65 0.89 0.84  Sodium 135 - 145 mmol/L 141 140 144  Potassium 3.5 - 5.1 mmol/L 3.9 3.6 3.8  Chloride 98 - 111 mmol/L 109 106 110  CO2 22 - 32 mmol/L '25 27 26  '$ Calcium 8.9 - 10.3 mg/dL 8.5(L) 8.6(L) 8.4(L)  Total Protein 6.5 - 8.1 g/dL 5.4(L) - 5.2(L)  Total Bilirubin 0.3 - 1.2 mg/dL 0.4 - 0.3  Alkaline Phos 38 - 126 U/L 75 - 76  AST 15 - 41 U/L 12(L) - 9(L)  ALT 0 - 44 U/L 14 - 12     RADIOGRAPHIC STUDIES: I personally have viewed the radiographic studies below: CT C/A/P showed increased lymphadenopathy of supraclavicular node with new lung nodule. Bone lesions responding. Evidence of T8 kyphoplasty. DG Thoracic Spine 2 View  Result Date: 03/15/2020 CLINICAL DATA:  Elective surgery. EXAM: THORACIC SPINE 2 VIEWS; DG C-ARM 1-60 MIN COMPARISON:  CT 12/29/2019. FINDINGS: Thoracic spine cannot be numbered due to positioning. Lower thoracic vertebroplasty. Methylmethacrylate noted within and adjacent to the treated vertebral body. IMPRESSION: Postsurgical changes thoracic spine. Electronically Signed   By: Marcello Moores  Register   On: 03/15/2020 14:28   CT Chest W Contrast  Result Date: 03/22/2020 CLINICAL DATA:  Right-sided lung cancer. EXAM: CT CHEST, ABDOMEN, AND PELVIS WITH CONTRAST TECHNIQUE: Multidetector CT imaging of the chest, abdomen and pelvis was performed following the standard protocol during bolus administration of intravenous contrast. CONTRAST:  123m OMNIPAQUE IOHEXOL 300 MG/ML  SOLN COMPARISON:  12/29/2019 FINDINGS: CT CHEST FINDINGS Cardiovascular: Heart size upper normal. Trace pericardial effusion similar to prior. Coronary artery calcification is evident. Atherosclerotic calcification is noted in the wall of the thoracic aorta. Right Port-A-Cath tip is positioned in the distal SVC. Mediastinum/Nodes: 6 mm left supraclavicular node identified on the previous study is now  14 mm on image 8/2. Stable mediastinal lymphadenopathy. Index right paratracheal node is stable at 6 mm short axis. Index node measured subcarinal station on prior study at 12 mm is 12 mm today. There is no hilar lymphadenopathy. The esophagus has normal imaging features. There is no axillary lymphadenopathy. Lungs/Pleura: Interval development of new interstitial and consolidative airspace opacity in the paraspinal right lower lobe with associated volume loss. The confluent opacity posterior to the fissure on the prior study has decreased in the interval with similar appearance of a 13 mm spiculated nodular component in the right lower lobe on 84/4. 8 mm nodule in the right lower lobe on 69/4 is new in the interval. Scattered small ground-glass opacities are noted in the lungs bilaterally, similar to prior. New atelectasis is noted in the medial left lower lobe. No pleural effusion. Musculoskeletal: T8 vertebral augmentation is new in the interval. T7 and T10 sclerotic lesions identified previously are stable. CT ABDOMEN PELVIS FINDINGS Hepatobiliary: No suspicious focal abnormality within the liver parenchyma. Trace intrahepatic biliary duct prominence is stable. Gallbladder is surgically absent. Common bile duct measures 6 mm diameter in the head of pancreas, upper normal. Pancreas: No focal mass lesion. No dilatation of the main duct. No intraparenchymal cyst. No peripancreatic edema. Spleen: No splenomegaly. No focal mass lesion. Adrenals/Urinary Tract: No adrenal nodule or mass. Kidneys unremarkable. No evidence for hydroureter. The urinary bladder appears normal for the degree of distention. Stomach/Bowel: Stomach is unremarkable. No gastric wall thickening. No evidence of outlet obstruction. Duodenum is normally positioned as is the ligament of Treitz. No small bowel wall thickening. No small bowel dilatation. The terminal ileum is normal. The appendix is not visualized, but there is no edema or inflammation  in the region of the cecum. No gross  colonic mass. No colonic wall thickening. Vascular/Lymphatic: There is abdominal aortic atherosclerosis without aneurysm. There is no gastrohepatic or hepatoduodenal ligament lymphadenopathy. No retroperitoneal or mesenteric lymphadenopathy. No pelvic sidewall lymphadenopathy. Reproductive: The uterus is surgically absent. There is no adnexal mass. Other: No intraperitoneal free fluid. Musculoskeletal: Sclerotic lesion posterior left acetabulum is associated with other sclerotic foci in the bony pelvis. Right-sided sclerotic lesion in L1 is less prominent today showing less central lucency. Sclerotic L4 lesion measures 8 mm today compared to 12 mm when I remeasure on the previous exam. Lytic lesion posterior right iliac bone is similar in size at 2.6 cm but now shows a thick sclerotic rim likely secondary to treatment effects and interval healing. IMPRESSION: 1. Potential interval mixed response to therapy. Left supraclavicular node is progressed while scattered bone lesions appear improved. 2. Interval progression of left supraclavicular lymphadenopathy with stable mediastinal lymphadenopathy. 3. Interval development of new interstitial and consolidative airspace opacity in the paraspinal right lower lobe with associated volume loss. Imaging features are likely related to radiation therapy. 4. The confluent opacity posterior to the fissure on the prior study has decreased in the interval with similar appearance of a 13 mm spiculated nodular component in the right lower lobe. New 8 mm right lower lobe pulmonary nodule. Close attention on follow-up recommended as metastatic disease not excluded. 5. Stable appearance of mediastinal lymphadenopathy. 6. Scattered small ground-glass opacities in the lungs bilaterally, similar to prior. Attention on follow-up recommended. 7. Interval T8 vertebral augmentation. 8. Aortic Atherosclerosis (ICD10-I70.0). Electronically Signed   By: Misty Stanley M.D.   On: 03/22/2020 13:48   CT Abdomen Pelvis W Contrast  Result Date: 03/22/2020 CLINICAL DATA:  Right-sided lung cancer. EXAM: CT CHEST, ABDOMEN, AND PELVIS WITH CONTRAST TECHNIQUE: Multidetector CT imaging of the chest, abdomen and pelvis was performed following the standard protocol during bolus administration of intravenous contrast. CONTRAST:  128m OMNIPAQUE IOHEXOL 300 MG/ML  SOLN COMPARISON:  12/29/2019 FINDINGS: CT CHEST FINDINGS Cardiovascular: Heart size upper normal. Trace pericardial effusion similar to prior. Coronary artery calcification is evident. Atherosclerotic calcification is noted in the wall of the thoracic aorta. Right Port-A-Cath tip is positioned in the distal SVC. Mediastinum/Nodes: 6 mm left supraclavicular node identified on the previous study is now 14 mm on image 8/2. Stable mediastinal lymphadenopathy. Index right paratracheal node is stable at 6 mm short axis. Index node measured subcarinal station on prior study at 12 mm is 12 mm today. There is no hilar lymphadenopathy. The esophagus has normal imaging features. There is no axillary lymphadenopathy. Lungs/Pleura: Interval development of new interstitial and consolidative airspace opacity in the paraspinal right lower lobe with associated volume loss. The confluent opacity posterior to the fissure on the prior study has decreased in the interval with similar appearance of a 13 mm spiculated nodular component in the right lower lobe on 84/4. 8 mm nodule in the right lower lobe on 69/4 is new in the interval. Scattered small ground-glass opacities are noted in the lungs bilaterally, similar to prior. New atelectasis is noted in the medial left lower lobe. No pleural effusion. Musculoskeletal: T8 vertebral augmentation is new in the interval. T7 and T10 sclerotic lesions identified previously are stable. CT ABDOMEN PELVIS FINDINGS Hepatobiliary: No suspicious focal abnormality within the liver parenchyma. Trace  intrahepatic biliary duct prominence is stable. Gallbladder is surgically absent. Common bile duct measures 6 mm diameter in the head of pancreas, upper normal. Pancreas: No focal mass lesion. No dilatation of the main duct.  No intraparenchymal cyst. No peripancreatic edema. Spleen: No splenomegaly. No focal mass lesion. Adrenals/Urinary Tract: No adrenal nodule or mass. Kidneys unremarkable. No evidence for hydroureter. The urinary bladder appears normal for the degree of distention. Stomach/Bowel: Stomach is unremarkable. No gastric wall thickening. No evidence of outlet obstruction. Duodenum is normally positioned as is the ligament of Treitz. No small bowel wall thickening. No small bowel dilatation. The terminal ileum is normal. The appendix is not visualized, but there is no edema or inflammation in the region of the cecum. No gross colonic mass. No colonic wall thickening. Vascular/Lymphatic: There is abdominal aortic atherosclerosis without aneurysm. There is no gastrohepatic or hepatoduodenal ligament lymphadenopathy. No retroperitoneal or mesenteric lymphadenopathy. No pelvic sidewall lymphadenopathy. Reproductive: The uterus is surgically absent. There is no adnexal mass. Other: No intraperitoneal free fluid. Musculoskeletal: Sclerotic lesion posterior left acetabulum is associated with other sclerotic foci in the bony pelvis. Right-sided sclerotic lesion in L1 is less prominent today showing less central lucency. Sclerotic L4 lesion measures 8 mm today compared to 12 mm when I remeasure on the previous exam. Lytic lesion posterior right iliac bone is similar in size at 2.6 cm but now shows a thick sclerotic rim likely secondary to treatment effects and interval healing. IMPRESSION: 1. Potential interval mixed response to therapy. Left supraclavicular node is progressed while scattered bone lesions appear improved. 2. Interval progression of left supraclavicular lymphadenopathy with stable mediastinal  lymphadenopathy. 3. Interval development of new interstitial and consolidative airspace opacity in the paraspinal right lower lobe with associated volume loss. Imaging features are likely related to radiation therapy. 4. The confluent opacity posterior to the fissure on the prior study has decreased in the interval with similar appearance of a 13 mm spiculated nodular component in the right lower lobe. New 8 mm right lower lobe pulmonary nodule. Close attention on follow-up recommended as metastatic disease not excluded. 5. Stable appearance of mediastinal lymphadenopathy. 6. Scattered small ground-glass opacities in the lungs bilaterally, similar to prior. Attention on follow-up recommended. 7. Interval T8 vertebral augmentation. 8. Aortic Atherosclerosis (ICD10-I70.0). Electronically Signed   By: Misty Stanley M.D.   On: 03/22/2020 13:48   DG C-Arm 1-60 Min  Result Date: 03/15/2020 CLINICAL DATA:  Elective surgery. EXAM: THORACIC SPINE 2 VIEWS; DG C-ARM 1-60 MIN COMPARISON:  CT 12/29/2019. FINDINGS: Thoracic spine cannot be numbered due to positioning. Lower thoracic vertebroplasty. Methylmethacrylate noted within and adjacent to the treated vertebral body. IMPRESSION: Postsurgical changes thoracic spine. Electronically Signed   By: Marcello Moores  Register   On: 03/15/2020 14:28   VAS [name] LOWER EXTREMITY VENOUS (DVT) (ONLY [facility] & [facility] 7a-7p)  Result Date: 03/19/2020  Lower Venous DVTStudy Indications: Edema, and History of left lower extremity DVT.  Comparison Study: 12/24/2019- age indeterminate left PTV and peroneal vein DVT Performing Technologist: Maudry Mayhew MHA, RDMS, RVT, RDCS  Examination Guidelines: A complete evaluation includes B-mode imaging, spectral Doppler, color Doppler, and power Doppler as needed of all accessible portions of each vessel. Bilateral testing is considered an integral part of a complete examination. Limited examinations for reoccurring indications may be performed as noted. The  reflux portion of the exam is performed with the patient in reverse Trendelenburg.  +---------+---------------+---------+-----------+----------+--------------+ RIGHT    CompressibilityPhasicitySpontaneityPropertiesThrombus Aging +---------+---------------+---------+-----------+----------+--------------+ CFV      Full           Yes                                          +---------+---------------+---------+-----------+----------+--------------+  SFJ      Full                                                        +---------+---------------+---------+-----------+----------+--------------+ FV Prox  Full                                                        +---------+---------------+---------+-----------+----------+--------------+ FV Mid   Full                                                        +---------+---------------+---------+-----------+----------+--------------+ FV DistalFull                                                        +---------+---------------+---------+-----------+----------+--------------+ PFV      Full                                                        +---------+---------------+---------+-----------+----------+--------------+ POP      Full           Yes      Yes                                 +---------+---------------+---------+-----------+----------+--------------+ PTV      Full                                                        +---------+---------------+---------+-----------+----------+--------------+ PERO     Full                                                        +---------+---------------+---------+-----------+----------+--------------+   Right Technical Findings: Not visualized segments include Limited visualization right peroneal veins.  +----+---------------+---------+-----------+----------+--------------+ LEFTCompressibilityPhasicitySpontaneityPropertiesThrombus Aging  +----+---------------+---------+-----------+----------+--------------+ CFV Full           Yes      Yes                                 +----+---------------+---------+-----------+----------+--------------+     Summary: RIGHT: - There is no evidence of deep vein thrombosis in the lower extremity. However, portions of this examination were limited- see technologist comments above.  - No cystic structure found in the popliteal fossa.  LEFT: - No evidence of common femoral vein obstruction.  *See table(s) above for measurements and observations. Electronically signed by Harold Barban MD on 03/19/2020 at 6:01:23 PM.    Final     ASSESSMENT & PLAN Virginia [name] 64 y.o. female with medical history significant for metastatic adenocarcinoma of the lung presents for a follow up visit. 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   Today Mrs. Figueira reports stable improvement nausea on Zyprexa therapy.  Fortunately she has been able to wean off most of her Compazine with this.  Unfortunately pain control still remains quite challenging, particularly after her T8 kyphoplasty.  She is currently taking oxycodone as needed. A long-acting has been able to be added by neurosurgery.    Today we discussed Carboplatin/Pembrolizumab/Pemetrexed and the expected side effect from these medications.  We discussed the possible side effects of immunotherapy including colitis, hepatitis, dermatitis, and pneumonitis.  Additionally we discussed the side effects of carboplatin which include suppression of blood counts, nephrotoxicity, and nausea.  Additionally we discussed pemetrexed which can also have effects on counts and would require supplementation with vitamin I69 and folic acid.  She voiced her understanding of these side effects and treatment plans moving forward.  I also noted that we would be able to drop the chemotherapy agents that she had intolerance to continue with pembrolizumab  alone given her high TPS score.  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 This can be started if patient has progression or intolerance on oral therapy.  --patient will be followed every 3 weeks with labs and clinic visit. --port in place, patient will require chemotherapy education.  --allow for washout period with capmatinib prior to start of carbo/pem/pemb.  --plan for repeat CT C/A/P in Oct 2021 (3 months from last scan in July 2021) --supportive therapy as listed below.  --RTC q 3 weeks for continued monitoring.    #Nausea/Vomiting --will provide patient with Zofran 4-'8mg'$  q8h PRN and compazine '10mg'$  q6H for breakthrough PRN --continue olanzpaine 2.'5mg'$  PO QHS to help with nausea. Marked improvement since the addition of this medication on 01/29/2020.  --can consider Reglan if nausea worsens as it may be gastric motility issues related to her pain medication causing her symptoms.  --continue to monitor   #Hypokalemia, stable --likely 2/2 to decreased PO intake, hypovolemia, and BP medications --given her decreased BP would recommend holding losartan and hydrochlorothiazide --continue to monitor  #Pain Control, improving --continue oxycodone '5mg'$  q4H PRN. Can increase dose to '10mg'$  q6H if pain is severe --added MS Contin '30mg'$  q12 H scheduled to her regimen today for long acting support.  --continue to take with Senokot to prevent opioid induced constipation. 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  --nausea as above   #Headache, improved --appreciate the assistance of Dr. 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 --radiation therapy to the brain completed on 11/16/2019.  --continue to monitor   No orders of the defined types were placed in  this encounter.  All questions were answered. The patient knows to call the clinic with any problems, questions or concerns.  A total of more than 30 minutes were spent on this encounter and over half of that time was  spent on counseling and coordination of care as outlined above.   Ledell Peoples, MD Department of Hematology/Oncology [place] [facility] Phone: [phone] Pager: [phone] Email: [email or web address]  03/25/2020 11:22 AM   Literature Support:  Lorenza Chick, Rodrguez-Abreu D, Duffy Rhody, Felip E, Campbell F, Domine M, [place], Hochmair MJ, [place], [place], Bischoff HG, [place], Grossi F, [place], Reck M, [place], [place], [place], Rubio-Viqueira B, Novello S, Kurata T, Gray JE, Vida J, Wei Z, Yang J, Raftopoulos H, [employer], [place] [employer]; [id number] Investigators. Pembrolizumab plus Chemotherapy in Metastatic Non-Small-Cell Lung Cancer. [facility] Med. 2018 May 31;378(22):2078-2092.  --Median progression-free survival was 8.8 months (95% CI, 7.6 to 9.2) in the pembrolizumab-combination group and 4.9 months (95% CI, 4.7 to 5.5) in the placebo-combination group (hazard ratio for disease progression or death, 0.52; 95% CI, 0.43 to 0.64; P<0.001). Adverse events of grade 3 or higher occurred in 67.2% of the patients in the pembrolizumab-combination group and in 65.8% of those in the placebo-combination group.  Jodene Nam L. Efficacy of pemetrexed-based regimens in advanced non-small cell lung cancer patients with activating epidermal growth factor receptor mutations after tyrosine kinase inhibitor failure: a systematic review. Onco Targets Ther. 2018;11:2121-2129.   --The weighted median PFS, median OS, and ORR for patients treated with pem regimens were 5.09 months, 15.91 months, and 30.19%, respectively. Our systematic review results showed a favorable efficacy profile of pem regimens in NSCLC patients with  EGFR mutation after EGFR-TKI failure.

## 2020-03-27 ENCOUNTER — Encounter: Payer: Self-pay | Admitting: Hematology and Oncology

## 2020-03-31 ENCOUNTER — Telehealth: Payer: Self-pay | Admitting: *Deleted

## 2020-03-31 ENCOUNTER — Telehealth: Payer: Self-pay | Admitting: Hematology and Oncology

## 2020-03-31 NOTE — Telephone Encounter (Signed)
Received call from pt's husband with questions related to his wife's change in treatment plan.  These included when it would be scheduled, any changes to her current medications and possible side effects.  He asked if she should stop her Jeryl Columbia that she takes for nausea-he states she had already stopped it. She uses this for nausea. She started having headaches after stopping. Advised to continue this daily as she had been as stopping suddenly could cause headaches.  Advised her new chemo will likely start next week and that we will schedule a chemo education class prior to the start of her chemo. Advised that a scheduler will be calling in the next few days to set up these appts. He voiced understanding

## 2020-03-31 NOTE — Telephone Encounter (Signed)
Called pt per 7/22 sch msg - no answer. Left message with apt date and time

## 2020-04-01 ENCOUNTER — Telehealth: Payer: Self-pay | Admitting: *Deleted

## 2020-04-01 ENCOUNTER — Other Ambulatory Visit: Payer: Self-pay | Admitting: Hematology and Oncology

## 2020-04-01 ENCOUNTER — Encounter: Payer: Self-pay | Admitting: Internal Medicine

## 2020-04-01 ENCOUNTER — Telehealth (INDEPENDENT_AMBULATORY_CARE_PROVIDER_SITE_OTHER): Payer: BC Managed Care – PPO | Admitting: Internal Medicine

## 2020-04-01 DIAGNOSIS — R600 Localized edema: Secondary | ICD-10-CM

## 2020-04-01 NOTE — Telephone Encounter (Signed)
TCT patient's husband regarding upcoming appts for chemo education and her first treatment. No answer but was able to leave message on vm.

## 2020-04-01 NOTE — Telephone Encounter (Signed)
Received telephone  call at 4:55 pm at my office from Mrs. Marzan [place] the beach with [place]. Her feet are swelling and she forgot to bring her Lasix with her from home. Directions from oncologist are for her to take Lasix 20 mg daily x 3 days if needed. Asked patient to find a pharmacy that is open so I can call Rx in.  Addendum: Patient called back at 5:05pm and asked I call in Rx to [place] [facility], [place]. Called in #12 Lasix 20 mg tablets  with directions: one tablet po daily for 3 days.  Patient was identified using 2 identifiers as Mistina Coatney. Piccini, a patient in this practice. I was at my office speaking with patient and patient was in her car at the beach. Her husband, [name], was driving.  Patient has history of non-small cell lung cancer with brain mets..From time to time has dependent edema since starting cancer treatment. Hot weather does not help nor does sitting for prolonged periods of time. She is under the care of Oncologist at [facility]. She is on Tabrecta per Dr. Lorenso Courier. She is on chronic anticoagulation consisting of Eliquis 5 mg daily.Also Xtampza ER 13.5 mg capsule q 12 hours.  Patient agreeable to visit in this format today.   Emeline General, M.D.

## 2020-04-05 ENCOUNTER — Inpatient Hospital Stay: Payer: BC Managed Care – PPO

## 2020-04-05 ENCOUNTER — Encounter: Payer: Self-pay | Admitting: Hematology and Oncology

## 2020-04-05 ENCOUNTER — Other Ambulatory Visit: Payer: Self-pay

## 2020-04-05 NOTE — Progress Notes (Signed)
Called pt to introduce myself as her Arboriculturist, discuss copay assistance and the [employer].  I left a msg requesting she return my call at her earliest convenience if she's interested in applying for the grants.

## 2020-04-06 ENCOUNTER — Telehealth: Payer: Self-pay | Admitting: *Deleted

## 2020-04-06 NOTE — Progress Notes (Addendum)
Pharmacist Chemotherapy Monitoring - Initial Assessment    Anticipated start date: 04/08/20  Regimen:  . Are orders appropriate based on the patient's diagnosis, regimen, and cycle? Yes . Does the plan date match the patient's scheduled date? Yes . Is the sequencing of drugs appropriate? Yes . Are the premedications appropriate for the patient's regimen? Yes . Prior Authorization for treatment is: Approved o If applicable, is the correct biosimilar selected based on the patient's insurance? not applicable  Organ Function and Labs: Marland Kitchen Are dose adjustments needed based on the patient's renal function, hepatic function, or hematologic function? No. May need to adjust Alimta soon based on updated BSA. Marland Kitchen Are appropriate labs ordered prior to the start of patient's treatment? Yes . Other organ system assessment, if indicated: N/A . The following baseline labs, if indicated, have been ordered: pembrolizumab: baseline TSH +/- T4 and zoledronic acid: SCr, Calcium  Dose Assessment: . Are the drug doses appropriate? Yes . Are the following correct: o Drug concentrations Yes o IV fluid compatible with drug Yes o Administration routes Yes o Timing of therapy Yes . If applicable, does the patient have documented access for treatment and/or plans for port-a-cath placement? Unknown  . If applicable, have lifetime cumulative doses been properly documented and assessed? yes Lifetime Dose Tracking  No doses have been documented on this patient for the following tracked chemicals: Doxorubicin, Epirubicin, Idarubicin, Daunorubicin, Mitoxantrone, Bleomycin, Oxaliplatin, Carboplatin, Liposomal Doxorubicin  o   Toxicity Monitoring/Prevention: . The patient has the following take home antiemetics prescribed: Prochlorperazine and Olanzapine . The patient has the following take home medications prescribed: Need Rx for FA? . Medication allergies and previous infusion related reactions, if applicable, have been  reviewed and addressed. Yes . The patient's current medication list has been assessed for drug-drug interactions with their chemotherapy regimen. no significant drug-drug interactions were identified on review.  Order Review: . Are the treatment plan orders signed? Yes . Is the patient scheduled to see a provider prior to their treatment? Yes  I verify that I have reviewed each item in the above checklist and answered each question accordingly.  Acquanetta Belling 04/06/2020 4:51 PM

## 2020-04-08 ENCOUNTER — Encounter: Payer: Self-pay | Admitting: Hematology and Oncology

## 2020-04-08 ENCOUNTER — Other Ambulatory Visit: Payer: Self-pay | Admitting: Hematology and Oncology

## 2020-04-08 ENCOUNTER — Inpatient Hospital Stay: Payer: BC Managed Care – PPO | Admitting: Hematology and Oncology

## 2020-04-08 ENCOUNTER — Inpatient Hospital Stay: Payer: BC Managed Care – PPO

## 2020-04-08 ENCOUNTER — Other Ambulatory Visit: Payer: Self-pay

## 2020-04-08 VITALS — BP 129/106 | HR 93 | Temp 98.1°F | Resp 18 | Wt 136.9 lb

## 2020-04-08 VITALS — BP 136/91

## 2020-04-08 DIAGNOSIS — C3491 Malignant neoplasm of unspecified part of right bronchus or lung: Secondary | ICD-10-CM

## 2020-04-08 DIAGNOSIS — R110 Nausea: Secondary | ICD-10-CM | POA: Diagnosis not present

## 2020-04-08 DIAGNOSIS — R5383 Other fatigue: Secondary | ICD-10-CM | POA: Diagnosis not present

## 2020-04-08 DIAGNOSIS — C7931 Secondary malignant neoplasm of brain: Secondary | ICD-10-CM

## 2020-04-08 DIAGNOSIS — C7951 Secondary malignant neoplasm of bone: Secondary | ICD-10-CM

## 2020-04-08 DIAGNOSIS — C3490 Malignant neoplasm of unspecified part of unspecified bronchus or lung: Secondary | ICD-10-CM | POA: Diagnosis not present

## 2020-04-08 DIAGNOSIS — M549 Dorsalgia, unspecified: Secondary | ICD-10-CM | POA: Diagnosis not present

## 2020-04-08 DIAGNOSIS — E876 Hypokalemia: Secondary | ICD-10-CM | POA: Diagnosis not present

## 2020-04-08 DIAGNOSIS — M7989 Other specified soft tissue disorders: Secondary | ICD-10-CM | POA: Diagnosis not present

## 2020-04-08 DIAGNOSIS — Z86718 Personal history of other venous thrombosis and embolism: Secondary | ICD-10-CM | POA: Diagnosis not present

## 2020-04-08 DIAGNOSIS — I700 Atherosclerosis of aorta: Secondary | ICD-10-CM | POA: Diagnosis not present

## 2020-04-08 DIAGNOSIS — R590 Localized enlarged lymph nodes: Secondary | ICD-10-CM | POA: Diagnosis not present

## 2020-04-08 DIAGNOSIS — Z9049 Acquired absence of other specified parts of digestive tract: Secondary | ICD-10-CM | POA: Diagnosis not present

## 2020-04-08 DIAGNOSIS — Z7901 Long term (current) use of anticoagulants: Secondary | ICD-10-CM | POA: Diagnosis not present

## 2020-04-08 DIAGNOSIS — R519 Headache, unspecified: Secondary | ICD-10-CM | POA: Diagnosis not present

## 2020-04-08 DIAGNOSIS — Z79899 Other long term (current) drug therapy: Secondary | ICD-10-CM | POA: Diagnosis not present

## 2020-04-08 DIAGNOSIS — Z5111 Encounter for antineoplastic chemotherapy: Secondary | ICD-10-CM | POA: Diagnosis not present

## 2020-04-08 DIAGNOSIS — R112 Nausea with vomiting, unspecified: Secondary | ICD-10-CM | POA: Diagnosis not present

## 2020-04-08 DIAGNOSIS — R918 Other nonspecific abnormal finding of lung field: Secondary | ICD-10-CM

## 2020-04-08 DIAGNOSIS — C3431 Malignant neoplasm of lower lobe, right bronchus or lung: Secondary | ICD-10-CM | POA: Diagnosis not present

## 2020-04-08 DIAGNOSIS — Z5112 Encounter for antineoplastic immunotherapy: Secondary | ICD-10-CM | POA: Diagnosis not present

## 2020-04-08 LAB — CBC WITH DIFFERENTIAL (CANCER CENTER ONLY)
Abs Immature Granulocytes: 0.01 10*3/uL (ref 0.00–0.07)
Basophils Absolute: 0 10*3/uL (ref 0.0–0.1)
Basophils Relative: 1 %
Eosinophils Absolute: 0.3 10*3/uL (ref 0.0–0.5)
Eosinophils Relative: 6 %
HCT: 34.7 % — ABNORMAL LOW (ref 36.0–46.0)
Hemoglobin: 11.8 g/dL — ABNORMAL LOW (ref 12.0–15.0)
Immature Granulocytes: 0 %
Lymphocytes Relative: 27 %
Lymphs Abs: 1.5 10*3/uL (ref 0.7–4.0)
MCH: 31.4 pg (ref 26.0–34.0)
MCHC: 34 g/dL (ref 30.0–36.0)
MCV: 92.3 fL (ref 80.0–100.0)
Monocytes Absolute: 0.7 10*3/uL (ref 0.1–1.0)
Monocytes Relative: 14 %
Neutro Abs: 2.9 10*3/uL (ref 1.7–7.7)
Neutrophils Relative %: 52 %
Platelet Count: 248 10*3/uL (ref 150–400)
RBC: 3.76 MIL/uL — ABNORMAL LOW (ref 3.87–5.11)
RDW: 13.2 % (ref 11.5–15.5)
WBC Count: 5.5 10*3/uL (ref 4.0–10.5)
nRBC: 0 % (ref 0.0–0.2)

## 2020-04-08 LAB — CMP (CANCER CENTER ONLY)
ALT: 30 U/L (ref 0–44)
AST: 17 U/L (ref 15–41)
Albumin: 3.1 g/dL — ABNORMAL LOW (ref 3.5–5.0)
Alkaline Phosphatase: 76 U/L (ref 38–126)
Anion gap: 7 (ref 5–15)
BUN: 9 mg/dL (ref 8–23)
CO2: 26 mmol/L (ref 22–32)
Calcium: 9.5 mg/dL (ref 8.9–10.3)
Chloride: 109 mmol/L (ref 98–111)
Creatinine: 0.63 mg/dL (ref 0.44–1.00)
GFR, Est AFR Am: >60 mL/min (ref >60)
GFR, Estimated: >60 mL/min (ref >60)
Glucose, Bld: 101 mg/dL — ABNORMAL HIGH (ref 70–99)
Potassium: 3.7 mmol/L (ref 3.5–5.1)
Sodium: 142 mmol/L (ref 135–145)
Total Bilirubin: 0.4 mg/dL (ref 0.3–1.2)
Total Protein: 6 g/dL — ABNORMAL LOW (ref 6.5–8.1)

## 2020-04-08 LAB — TSH: TSH: 1.597 u[IU]/mL (ref 0.308–3.960)

## 2020-04-08 LAB — LACTATE DEHYDROGENASE: LDH: 171 U/L (ref 98–192)

## 2020-04-08 MED ORDER — HEPARIN SOD (PORK) LOCK FLUSH 100 UNIT/ML IV SOLN
500.0000 [IU] | Freq: Once | INTRAVENOUS | Status: AC | PRN
  Administered 2020-04-08: 500 [IU]
  Filled 2020-04-08: qty 5

## 2020-04-08 MED ORDER — SODIUM CHLORIDE 0.9 % IV SOLN
150.0000 mg | Freq: Once | INTRAVENOUS | Status: AC
  Administered 2020-04-08: 150 mg via INTRAVENOUS
  Filled 2020-04-08: qty 150

## 2020-04-08 MED ORDER — PALONOSETRON HCL INJECTION 0.25 MG/5ML
INTRAVENOUS | Status: AC
  Filled 2020-04-08: qty 5

## 2020-04-08 MED ORDER — SODIUM CHLORIDE 0.9 % IV SOLN
435.5000 mg | Freq: Once | INTRAVENOUS | Status: AC
  Administered 2020-04-08: 440 mg via INTRAVENOUS
  Filled 2020-04-08: qty 44

## 2020-04-08 MED ORDER — SODIUM CHLORIDE 0.9 % IV SOLN
200.0000 mg | Freq: Once | INTRAVENOUS | Status: AC
  Administered 2020-04-08: 200 mg via INTRAVENOUS
  Filled 2020-04-08: qty 8

## 2020-04-08 MED ORDER — SODIUM CHLORIDE 0.9 % IV SOLN
10.0000 mg | Freq: Once | INTRAVENOUS | Status: AC
  Administered 2020-04-08: 10 mg via INTRAVENOUS
  Filled 2020-04-08: qty 10

## 2020-04-08 MED ORDER — SODIUM CHLORIDE 0.9 % IV SOLN
Freq: Once | INTRAVENOUS | Status: AC
  Filled 2020-04-08: qty 250

## 2020-04-08 MED ORDER — SODIUM CHLORIDE 0.9 % IV SOLN
500.0000 mg/m2 | Freq: Once | INTRAVENOUS | Status: AC
  Administered 2020-04-08: 900 mg via INTRAVENOUS
  Filled 2020-04-08: qty 20

## 2020-04-08 MED ORDER — PALONOSETRON HCL INJECTION 0.25 MG/5ML
0.2500 mg | Freq: Once | INTRAVENOUS | Status: AC
  Administered 2020-04-08: 0.25 mg via INTRAVENOUS

## 2020-04-08 MED ORDER — CYANOCOBALAMIN 1000 MCG/ML IJ SOLN
INTRAMUSCULAR | Status: AC
  Filled 2020-04-08: qty 1

## 2020-04-08 MED ORDER — SODIUM CHLORIDE 0.9% FLUSH
10.0000 mL | INTRAVENOUS | Status: DC | PRN
  Administered 2020-04-08: 10 mL
  Filled 2020-04-08: qty 10

## 2020-04-08 MED ORDER — CYANOCOBALAMIN 1000 MCG/ML IJ SOLN
1000.0000 ug | Freq: Once | INTRAMUSCULAR | Status: AC
  Administered 2020-04-08: 1000 ug via INTRAMUSCULAR

## 2020-04-08 MED ORDER — FOLIC ACID 1 MG PO TABS
1.0000 mg | ORAL_TABLET | Freq: Every day | ORAL | 1 refills | Status: DC
Start: 2020-04-08 — End: 2020-09-22

## 2020-04-08 MED FILL — FOLIC ACID 1 MG TABS: 1 | 90 days supply | Qty: 90 | Fill #0

## 2020-04-08 NOTE — Progress Notes (Signed)
Pt's husband returned my call on 04/07/20 and we discussed copay assistance.  Pt would like to apply so I completed the [employer] enrollmentform for Keytruda, got the pt's and Dr. Libby Maw signature and faxed today for processing.  I also completed the[facility] On[facility] Supportapplication forAlimta,gotthe pt's andDr. Dorsey's signature and faxedtodayfor processing. I will notify the pt of the outcome once received.  Pt is overqualified for the [employer].  She has my card for any questions or concerns she may have in the future.

## 2020-04-08 NOTE — Patient Instructions (Addendum)
[facility] Discharge Instructions for Patients Receiving Chemotherapy  Today you received the following chemotherapy agents: Keytruda/Alimta/Carboplatin.  To help prevent nausea and vomiting after your treatment, we encourage you to take your nausea medication as directed.   If you develop nausea and vomiting that is not controlled by your nausea medication, call the clinic.   BELOW ARE SYMPTOMS THAT SHOULD BE REPORTED IMMEDIATELY:  *FEVER GREATER THAN 100.5 F  *CHILLS WITH OR WITHOUT FEVER  NAUSEA AND VOMITING THAT IS NOT CONTROLLED WITH YOUR NAUSEA MEDICATION  *UNUSUAL SHORTNESS OF BREATH  *UNUSUAL BRUISING OR BLEEDING  TENDERNESS IN MOUTH AND THROAT WITH OR WITHOUT PRESENCE OF ULCERS  *URINARY PROBLEMS  *BOWEL PROBLEMS  UNUSUAL RASH Items with * indicate a potential emergency and should be followed up as soon as possible.  Feel free to call the clinic should you have any questions or concerns. The clinic phone number is [phone].  Please show the [facility] at check-in to the Emergency Department and triage nurse.  Pembrolizumab injection What is this medicine? PEMBROLIZUMAB (pem broe liz ue mab) is a monoclonal antibody. It is used to treat certain types of cancer. This medicine may be used for other purposes; ask your health care provider or pharmacist if you have questions. COMMON BRAND NAME(S): Keytruda What should I tell my health care provider before I take this medicine? They need to know if you have any of these conditions:  diabetes  immune system problems  inflammatory bowel disease  liver disease  lung or breathing disease  lupus  received or scheduled to receive an organ transplant or a stem-cell transplant that uses donor stem cells  an unusual or allergic reaction to pembrolizumab, other medicines, foods, dyes, or preservatives  pregnant or trying to get pregnant  breast-feeding How should I use this  medicine? This medicine is for infusion into a vein. It is given by a health care professional in a hospital or clinic setting. A special MedGuide will be given to you before each treatment. Be sure to read this information carefully each time. Talk to your pediatrician regarding the use of this medicine in children. While this drug may be prescribed for children as young as 6 months for selected conditions, precautions do apply. Overdosage: If you think you have taken too much of this medicine contact a poison control center or emergency room at once. NOTE: This medicine is only for you. Do not share this medicine with others. What if I miss a dose? It is important not to miss your dose. Call your doctor or health care professional if you are unable to keep an appointment. What may interact with this medicine? Interactions have not been studied. Give your health care provider a list of all the medicines, herbs, non-prescription drugs, or dietary supplements you use. Also tell them if you smoke, drink alcohol, or use illegal drugs. Some items may interact with your medicine. This list may not describe all possible interactions. Give your health care provider a list of all the medicines, herbs, non-prescription drugs, or dietary supplements you use. Also tell them if you smoke, drink alcohol, or use illegal drugs. Some items may interact with your medicine. What should I watch for while using this medicine? Your condition will be monitored carefully while you are receiving this medicine. You may need blood work done while you are taking this medicine. Do not become pregnant while taking this medicine or for 4 months after stopping it. Women should  inform their doctor if they wish to become pregnant or think they might be pregnant. There is a potential for serious side effects to an unborn child. Talk to your health care professional or pharmacist for more information. Do not breast-feed an infant while  taking this medicine or for 4 months after the last dose. What side effects may I notice from receiving this medicine? Side effects that you should report to your doctor or health care professional as soon as possible:  allergic reactions like skin rash, itching or hives, swelling of the face, lips, or tongue  bloody or black, tarry  breathing problems  changes in vision  chest pain  chills  confusion  constipation  cough  diarrhea  dizziness or feeling faint or lightheaded  fast or irregular heartbeat  fever  flushing  joint pain  low blood counts - this medicine may decrease the number of white blood cells, red blood cells and platelets. You may be at increased risk for infections and bleeding.  muscle pain  muscle weakness  pain, tingling, numbness in the hands or feet  persistent headache  redness, blistering, peeling or loosening of the skin, including inside the mouth  signs and symptoms of high blood sugar such as dizziness; dry mouth; dry skin; fruity breath; nausea; stomach pain; increased hunger or thirst; increased urination  signs and symptoms of kidney injury like trouble passing urine or change in the amount of urine  signs and symptoms of liver injury like dark urine, light-colored stools, loss of appetite, nausea, right upper belly pain, yellowing of the eyes or skin  sweating  swollen lymph nodes  weight loss Side effects that usually do not require medical attention (report to your doctor or health care professional if they continue or are bothersome):  decreased appetite  hair loss  muscle pain  tiredness This list may not describe all possible side effects. Call your doctor for medical advice about side effects. You may report side effects to FDA at 1-800-FDA-1088. Where should I keep my medicine? This drug is given in a hospital or clinic and will not be stored at home. NOTE: This sheet is a summary. It may not cover all  possible information. If you have questions about this medicine, talk to your doctor, pharmacist, or health care provider.  2020 Elsevier/Gold Standard (2019-07-03 18:07:58)  Pemetrexed injection What is this medicine? PEMETREXED (PEM e TREX ed) is a chemotherapy drug used to treat lung cancers like non-small cell lung cancer and mesothelioma. It may also be used to treat other cancers. This medicine may be used for other purposes; ask your health care provider or pharmacist if you have questions. COMMON BRAND NAME(S): Alimta What should I tell my health care provider before I take this medicine? They need to know if you have any of these conditions:  infection (especially a virus infection such as chickenpox, cold sores, or herpes)  kidney disease  low blood counts, like low white cell, platelet, or red cell counts  lung or breathing disease, like asthma  radiation therapy  an unusual or allergic reaction to pemetrexed, other medicines, foods, dyes, or preservative  pregnant or trying to get pregnant  breast-feeding How should I use this medicine? This drug is given as an infusion into a vein. It is administered in a hospital or clinic by a specially trained health care professional. Talk to your pediatrician regarding the use of this medicine in children. Special care may be needed. Overdosage: If you think  you have taken too much of this medicine contact a poison control center or emergency room at once. NOTE: This medicine is only for you. Do not share this medicine with others. What if I miss a dose? It is important not to miss your dose. Call your doctor or health care professional if you are unable to keep an appointment. What may interact with this medicine? This medicine may interact with the following medications:  Ibuprofen This list may not describe all possible interactions. Give your health care provider a list of all the medicines, herbs, non-prescription drugs,  or dietary supplements you use. Also tell them if you smoke, drink alcohol, or use illegal drugs. Some items may interact with your medicine. What should I watch for while using this medicine? Visit your doctor for checks on your progress. This drug may make you feel generally unwell. This is not uncommon, as chemotherapy can affect healthy cells as well as cancer cells. Report any side effects. Continue your course of treatment even though you feel ill unless your doctor tells you to stop. In some cases, you may be given additional medicines to help with side effects. Follow all directions for their use. Call your doctor or health care professional for advice if you get a fever, chills or sore throat, or other symptoms of a cold or flu. Do not treat yourself. This drug decreases your body's ability to fight infections. Try to avoid being around people who are sick. This medicine may increase your risk to bruise or bleed. Call your doctor or health care professional if you notice any unusual bleeding. Be careful brushing and flossing your teeth or using a toothpick because you may get an infection or bleed more easily. If you have any dental work done, tell your dentist you are receiving this medicine. Avoid taking products that contain aspirin, acetaminophen, ibuprofen, naproxen, or ketoprofen unless instructed by your doctor. These medicines may hide a fever. Call your doctor or health care professional if you get diarrhea or mouth sores. Do not treat yourself. To protect your kidneys, drink water or other fluids as directed while you are taking this medicine. Do not become pregnant while taking this medicine or for 6 months after stopping it. Women should inform their doctor if they wish to become pregnant or think they might be pregnant. Men should not father a child while taking this medicine and for 3 months after stopping it. This may interfere with the ability to father a child. You should talk to  your doctor or health care professional if you are concerned about your fertility. There is a potential for serious side effects to an unborn child. Talk to your health care professional or pharmacist for more information. Do not breast-feed an infant while taking this medicine or for 1 week after stopping it. What side effects may I notice from receiving this medicine? Side effects that you should report to your doctor or health care professional as soon as possible:  allergic reactions like skin rash, itching or hives, swelling of the face, lips, or tongue  breathing problems  redness, blistering, peeling or loosening of the skin, including inside the mouth  signs and symptoms of bleeding such as bloody or black, tarry stools; red or dark-brown urine; spitting up blood or brown material that looks like coffee grounds; red spots on the skin; unusual bruising or bleeding from the eye, gums, or nose  signs and symptoms of infection like fever or chills; cough; sore throat;  pain or trouble passing urine  signs and symptoms of kidney injury like trouble passing urine or change in the amount of urine  signs and symptoms of liver injury like dark yellow or brown urine; general ill feeling or flu-like symptoms; light-colored stools; loss of appetite; nausea; right upper belly pain; unusually weak or tired; yellowing of the eyes or skin Side effects that usually do not require medical attention (report to your doctor or health care professional if they continue or are bothersome):  constipation  mouth sores  nausea, vomiting  unusually weak or tired This list may not describe all possible side effects. Call your doctor for medical advice about side effects. You may report side effects to FDA at 1-800-FDA-1088. Where should I keep my medicine? This drug is given in a hospital or clinic and will not be stored at home. NOTE: This sheet is a summary. It may not cover all possible information. If  you have questions about this medicine, talk to your doctor, pharmacist, or health care provider.  2020 Elsevier/Gold Standard (2017-10-16 16:11:33)  Carboplatin injection What is this medicine? CARBOPLATIN (KAR boe pla tin) is a chemotherapy drug. It targets fast dividing cells, like cancer cells, and causes these cells to die. This medicine is used to treat ovarian cancer and many other cancers. This medicine may be used for other purposes; ask your health care provider or pharmacist if you have questions. COMMON BRAND NAME(S): Paraplatin What should I tell my health care provider before I take this medicine? They need to know if you have any of these conditions:  blood disorders  hearing problems  kidney disease  recent or ongoing radiation therapy  an unusual or allergic reaction to carboplatin, cisplatin, other chemotherapy, other medicines, foods, dyes, or preservatives  pregnant or trying to get pregnant  breast-feeding How should I use this medicine? This drug is usually given as an infusion into a vein. It is administered in a hospital or clinic by a specially trained health care professional. Talk to your pediatrician regarding the use of this medicine in children. Special care may be needed. Overdosage: If you think you have taken too much of this medicine contact a poison control center or emergency room at once. NOTE: This medicine is only for you. Do not share this medicine with others. What if I miss a dose? It is important not to miss a dose. Call your doctor or health care professional if you are unable to keep an appointment. What may interact with this medicine?  medicines for seizures  medicines to increase blood counts like filgrastim, pegfilgrastim, sargramostim  some antibiotics like amikacin, gentamicin, neomycin, streptomycin, tobramycin  vaccines Talk to your doctor or health care professional before taking any of these  medicines:  acetaminophen  aspirin  ibuprofen  ketoprofen  naproxen This list may not describe all possible interactions. Give your health care provider a list of all the medicines, herbs, non-prescription drugs, or dietary supplements you use. Also tell them if you smoke, drink alcohol, or use illegal drugs. Some items may interact with your medicine. What should I watch for while using this medicine? Your condition will be monitored carefully while you are receiving this medicine. You will need important blood work done while you are taking this medicine. This drug may make you feel generally unwell. This is not uncommon, as chemotherapy can affect healthy cells as well as cancer cells. Report any side effects. Continue your course of treatment even though you feel ill  unless your doctor tells you to stop. In some cases, you may be given additional medicines to help with side effects. Follow all directions for their use. Call your doctor or health care professional for advice if you get a fever, chills or sore throat, or other symptoms of a cold or flu. Do not treat yourself. This drug decreases your body's ability to fight infections. Try to avoid being around people who are sick. This medicine may increase your risk to bruise or bleed. Call your doctor or health care professional if you notice any unusual bleeding. Be careful brushing and flossing your teeth or using a toothpick because you may get an infection or bleed more easily. If you have any dental work done, tell your dentist you are receiving this medicine. Avoid taking products that contain aspirin, acetaminophen, ibuprofen, naproxen, or ketoprofen unless instructed by your doctor. These medicines may hide a fever. Do not become pregnant while taking this medicine. Women should inform their doctor if they wish to become pregnant or think they might be pregnant. There is a potential for serious side effects to an unborn child. Talk  to your health care professional or pharmacist for more information. Do not breast-feed an infant while taking this medicine. What side effects may I notice from receiving this medicine? Side effects that you should report to your doctor or health care professional as soon as possible:  allergic reactions like skin rash, itching or hives, swelling of the face, lips, or tongue  signs of infection - fever or chills, cough, sore throat, pain or difficulty passing urine  signs of decreased platelets or bleeding - bruising, pinpoint red spots on the skin, black, tarry stools, nosebleeds  signs of decreased red blood cells - unusually weak or tired, fainting spells, lightheadedness  breathing problems  changes in hearing  changes in vision  chest pain  high blood pressure  low blood counts - This drug may decrease the number of white blood cells, red blood cells and platelets. You may be at increased risk for infections and bleeding.  nausea and vomiting  pain, swelling, redness or irritation at the injection site  pain, tingling, numbness in the hands or feet  problems with balance, talking, walking  trouble passing urine or change in the amount of urine Side effects that usually do not require medical attention (report to your doctor or health care professional if they continue or are bothersome):  hair loss  loss of appetite  metallic taste in the mouth or changes in taste This list may not describe all possible side effects. Call your doctor for medical advice about side effects. You may report side effects to FDA at 1-800-FDA-1088. Where should I keep my medicine? This drug is given in a hospital or clinic and will not be stored at home. NOTE: This sheet is a summary. It may not cover all possible information. If you have questions about this medicine, talk to your doctor, pharmacist, or health care provider.  2020 Elsevier/Gold Standard (2007-12-02 14:38:05)

## 2020-04-08 NOTE — Progress Notes (Signed)
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 CT of the head showed showed concerning for metastatic disease involving the left cerebellum and right occipital lobe.  2) 09/30/2019: MRI brain confirms mass within the left cerebellum measures 1.6 x 1.3 x 1.5 cm as well as at least 8 metastatic lesions within the supratentorial and infratentorial brain as outlined 3) 10/01/2019: PET CT scan revealed hypermetabolic infrahilar right lower lobe mass with hypermetabolic nodal metastases in the right hilum, mediastinum, right supraclavicular region, left axilla and lower left neck. 4) 10/08/2019: US guided biopsy of the lymph nodes confirms poorly differentiated non-small cell carcinoma. Immunohistochemistry confirms adenocarcinoma of lung primary. PD-L1 and NGS later revealed a MET Exon 14 mutation and TPS of 90%.  5) 10/12/2019: Establish care with Dr. Lorenso Courier  6) 11/04/2019: Day 1 of capmatinib '400mg'$  BID 7) 11/09/2019: presented to Rad/Onc with a DVT in LLE. Seen in symptom management clinic and started on apixaban.  8) 12/29/2019: CT C/A/P showed interval decrease in size of mass involving the superior segment of right lower lobe and reduction in mediastinal and left supraclavicular adenopathy though some small spinal lesions were noted in the interim between the two sets of scans 9) 01/25/2020: temporarily stopped capmatinib due to symptoms of nausea/fatigue. Restarted therapy on 01/30/2020 after brief chemo holiday.  10) 03/22/2020: CT C/A/P reveals a mixed picture, with new lung nodule and  increased lymphadenopathy, however response in bone lesions. D/c capmatinib therapy 11) 04/08/2020: start of Carbo/Pem/Pem for 2nd line treatment.   Interval History:  Virginia [name] 64 y.o. female with medical history significant for metastatic adenocarcinoma of the lung presents for a follow up visit. The patient's last visit was on 03/25/2020 at which time we discussed our concern for progression of disease on the CT scan. In the interim since the last visit she has stopped capmatinib and had no new symptoms.   On exam today Virginia [name] notes that her pain is a 0 out of 10 in severity today.  She notes that her energy has been stable and that she is still "a little bit tired".  She notes that her appetite is good and she has not had any issues with nausea, vomiting, or diarrhea.  She is still taking the olanzapine nightly.  She does endorse having occasional headaches, but otherwise has been quite well.  She reports that she is going to be taking a trip to [place] with her husband from November 19 to August 07, 2020.  She is hoping to have her chemotherapy scheduled around this which is something we be happy to help accommodate.  She continues to note that her anticoagulant therapy has been well-tolerated and that she has not had any issues with bleeding, bruising, or dark stools.  She reports that her leg swelling is stable and she still does have some uncomfortable sensation in the right leg (which is not the one that has the clot in it).  Overall her health is stable as compared to her last visit.  On further discussing this he notes denies having any issues with fevers, chills, sweats, diarrhea, or constipation.  She notes  that her breathing has been good and she has not had any issues with cough, shortness of breath, or chest pain.  A full 10 point ROS is listed below.  MEDICAL HISTORY:  Past Medical History:  Diagnosis Date  . Anemia   . Anxiety   . Concussion 09/28/2019  . DVT (deep  venous thrombosis) ([place]) 2021   L leg  . Dyspnea   . GERD (gastroesophageal reflux disease)   . Hypercholesterolemia    per pt, she does not have elevated lipids  . Hypertension   . met lung ca dx'd 09/2019   mets to spine, hip and brain  . PONV (postoperative nausea and vomiting)   . Tobacco abuse     SURGICAL HISTORY: Past Surgical History:  Procedure Laterality Date  . ABDOMINAL HYSTERECTOMY     partial/ left ovaries  . CHOLECYSTECTOMY    . DILATION AND CURETTAGE OF UTERUS    . IR IMAGING GUIDED PORT INSERTION  10/23/2019  . KYPHOPLASTY N/A 03/15/2020   Procedure: Thoracic Eight KYPHOPLASTY;  Surgeon: Erline Levine, MD;  Location: [facility];  Service: Neurosurgery;  Laterality: N/A;  prone   . LIPOMA EXCISION  2018   removed under left breast and right thigh.  . TUBAL LIGATION      ALLERGIES:  has No Known Allergies.  MEDICATIONS:  Current Outpatient Medications  Medication Sig Dispense Refill  . albuterol (VENTOLIN HFA) 108 (90 Base) MCG/ACT inhaler Inhale 2 puffs into the lungs every 6 (six) hours as needed for wheezing or shortness of breath. 6.7 g 11  . ALPRAZolam (XANAX) 0.25 MG tablet Take 1 tablet (0.25 mg total) by mouth 2 (two) times daily as needed. for anxiety (Patient taking differently: Take 0.25 mg by mouth 2 (two) times daily as needed for anxiety. ) 60 tablet 5  . Cyanocobalamin (VITAMIN B12 PO) Take 2 tablets by mouth daily. Gummies    . ELIQUIS 5 MG TABS tablet TAKE ONE TABLET BY MOUTH TWICE A DAY (Patient taking differently: Take 5 mg by mouth 2 (two) times daily. ) 60 tablet 1  . furosemide (LASIX) 20 MG tablet 20 mg once daily for 3 days, then stop. May repeat if edema recurs. (Patient taking differently: Take 20 mg by mouth See admin instructions. 20 mg once daily for 3 days, then stop. Daily as needed  May repeat if edema recurs.) 30 tablet 1  . glycerin adult 2 g suppository Place 1 suppository rectally as needed for constipation. 12 suppository 0  .  lidocaine-prilocaine (EMLA) cream Apply 1 application topically as needed. Apply to port as needed. (Patient taking differently: Apply 1 application topically as needed (Apply to port as needed.). ) 30 g 0  . methocarbamol (ROBAXIN) 500 MG tablet Take 500 mg by mouth 3 (three) times daily as needed.    . Multiple Vitamins-Minerals (AIRBORNE PO) Take 1 tablet by mouth daily.     Marland Kitchen ofloxacin (OCUFLOX) 0.3 % ophthalmic solution 2 drops each eye 4 times a day for 5 days (Patient not taking: Reported on 03/09/2020) 5 mL 1  . OLANZapine (ZYPREXA) 2.5 MG tablet Take 1 tablet (2.5 mg total) by mouth at bedtime. 30 tablet 1  . oxyCODONE (OXY IR/ROXICODONE) 5 MG immediate release tablet TAKE 1 TO 2 TABLETS BY MOUTH EVERY 4 HOURS AS NEEDED FOR SEVERE OR BREAKTHOUGH PAIN 90 tablet 0  . pantoprazole (PROTONIX) 40 MG tablet TAKE 1 TABLET(40 MG) BY MOUTH DAILY (Patient taking differently: Take 40 mg by mouth daily. )  90 tablet 3  . polyethylene glycol (MIRALAX / GLYCOLAX) 17 g packet Take 17 g by mouth 2 (two) times daily.     . prochlorperazine (COMPAZINE) 10 MG tablet TAKE 1 TABLET BY MOUTH EVERY 6 HOURS AS NEEDED FOR NAUSEA OR VOMITING (Patient not taking: Reported on 02/25/2020) 30 tablet 0  . sennosides-docusate sodium (SENOKOT-S) 8.6-50 MG tablet Take 2-3 tablets by mouth See admin instructions. 2 in the morning and 3 at night. Adjust as needed    . sucralfate (CARAFATE) 1 g tablet Dissolve 1 tablet in 10 mL H20 and swallow 30 min prior to meals and bedtime. (Patient not taking: Reported on 02/25/2020) 50 tablet 3  . Vitamin D3 (VITAMIN D) 25 MCG tablet Take 2,000 Units by mouth daily.    Marland Kitchen XTAMPZA ER 13.5 MG C12A Take 1 capsule by mouth every 12 (twelve) hours.     No current facility-administered medications for this visit.    REVIEW OF SYSTEMS:   Constitutional: ( - ) fevers, ( - )  chills , ( - ) night sweats Eyes: ( - ) blurriness of vision, ( - ) double vision, ( - ) watery eyes Ears, nose, mouth,  throat, and face: ( - ) mucositis, ( - ) sore throat Respiratory: ( - ) cough, ( - ) dyspnea, ( - ) wheezes Cardiovascular: ( - ) palpitation, ( - ) chest discomfort, ( - ) lower extremity swelling Gastrointestinal:  ( - ) nausea, ( - ) heartburn, ( - ) change in bowel habits Skin: ( - ) abnormal skin rashes Lymphatics: ( - ) new lymphadenopathy, ( - ) easy bruising Neurological: ( - ) numbness, ( - ) tingling, ( - ) new weaknesses Behavioral/Psych: ( - ) mood change, ( - ) new changes  All other systems were reviewed with the patient and are negative.  PHYSICAL EXAMINATION: ECOG PERFORMANCE STATUS: 1 - Symptomatic but completely ambulatory  There were no vitals filed for this visit. There were no vitals filed for this visit.  GENERAL: well appearing middle aged Caucasian female in NAD  SKIN: skin color, texture, turgor are normal, no rashes or significant lesions EYES: conjunctiva are pink and non-injected, sclera clear LUNGS: clear to auscultation and percussion with normal breathing effort HEART: regular rate & rhythm and no murmurs and bilateral + 1 pitting lower extremity edema Musculoskeletal: no cyanosis of digits and no clubbing PSYCH: alert & oriented x 3, fluent speech NEURO: no focal motor/sensory deficits   LABORATORY DATA:  I have reviewed the data as listed CBC Latest Ref Rng & Units 03/25/2020 03/10/2020 02/25/2020  WBC 4.0 - 10.5 K/uL 6.1 7.0 6.0  Hemoglobin 12.0 - 15.0 g/dL 11.6(L) 12.4 11.5(L)  Hematocrit 36 - 46 % 33.9(L) 37.9 33.7(L)  Platelets 150 - 400 K/uL 279 310 261    CMP Latest Ref Rng & Units 03/25/2020 03/10/2020 02/25/2020  Glucose 70 - 99 mg/dL 87 123(H) 96  BUN 8 - 23 mg/dL '12 10 12  '$ Creatinine 0.44 - 1.00 mg/dL 0.65 0.89 0.84  Sodium 135 - 145 mmol/L 141 140 144  Potassium 3.5 - 5.1 mmol/L 3.9 3.6 3.8  Chloride 98 - 111 mmol/L 109 106 110  CO2 22 - 32 mmol/L '25 27 26  '$ Calcium 8.9 - 10.3 mg/dL 8.5(L) 8.6(L) 8.4(L)  Total Protein 6.5 - 8.1 g/dL  5.4(L) - 5.2(L)  Total Bilirubin 0.3 - 1.2 mg/dL 0.4 - 0.3  Alkaline Phos 38 - 126 U/L 75 - 76  AST 15 -  41 U/L 12(L) - 9(L)  ALT 0 - 44 U/L 14 - 12     RADIOGRAPHIC STUDIES: I personally have viewed the radiographic studies below: CT C/A/P showed increased lymphadenopathy of supraclavicular node with new lung nodule. Bone lesions responding. Evidence of T8 kyphoplasty. DG Thoracic Spine 2 View  Result Date: 03/15/2020 CLINICAL DATA:  Elective surgery. EXAM: THORACIC SPINE 2 VIEWS; DG C-ARM 1-60 MIN COMPARISON:  CT 12/29/2019. FINDINGS: Thoracic spine cannot be numbered due to positioning. Lower thoracic vertebroplasty. Methylmethacrylate noted within and adjacent to the treated vertebral body. IMPRESSION: Postsurgical changes thoracic spine. Electronically Signed   By: Maisie Fus  Register   On: 03/15/2020 14:28   CT Chest W Contrast  Result Date: 03/22/2020 CLINICAL DATA:  Right-sided lung cancer. EXAM: CT CHEST, ABDOMEN, AND PELVIS WITH CONTRAST TECHNIQUE: Multidetector CT imaging of the chest, abdomen and pelvis was performed following the standard protocol during bolus administration of intravenous contrast. CONTRAST:  OMNIPAQUE IOHEXOL 300 MG/ML  SOLN COMPARISON:  12/29/2019 FINDINGS: CT CHEST FINDINGS Cardiovascular: Heart size upper normal. Trace pericardial effusion similar to prior. Coronary artery calcification is evident. Atherosclerotic calcification is noted in the wall of the thoracic aorta. Right Port-A-Cath tip is positioned in the distal SVC. Mediastinum/Nodes: 6 mm left supraclavicular node identified on the previous study is now 14 mm on image 8/2. Stable mediastinal lymphadenopathy. Index right paratracheal node is stable at 6 mm short axis. Index node measured subcarinal station on prior study at 12 mm is 12 mm today. There is no hilar lymphadenopathy. The esophagus has normal imaging features. There is no axillary lymphadenopathy. Lungs/Pleura: Interval development of new  interstitial and consolidative airspace opacity in the paraspinal right lower lobe with associated volume loss. The confluent opacity posterior to the fissure on the prior study has decreased in the interval with similar appearance of a 13 mm spiculated nodular component in the right lower lobe on 84/4. 8 mm nodule in the right lower lobe on 69/4 is new in the interval. Scattered small ground-glass opacities are noted in the lungs bilaterally, similar to prior. New atelectasis is noted in the medial left lower lobe. No pleural effusion. Musculoskeletal: T8 vertebral augmentation is new in the interval. T7 and T10 sclerotic lesions identified previously are stable. CT ABDOMEN PELVIS FINDINGS Hepatobiliary: No suspicious focal abnormality within the liver parenchyma. Trace intrahepatic biliary duct prominence is stable. Gallbladder is surgically absent. Common bile duct measures 6 mm diameter in the head of pancreas, upper normal. Pancreas: No focal mass lesion. No dilatation of the main duct. No intraparenchymal cyst. No peripancreatic edema. Spleen: No splenomegaly. No focal mass lesion. Adrenals/Urinary Tract: No adrenal nodule or mass. Kidneys unremarkable. No evidence for hydroureter. The urinary bladder appears normal for the degree of distention. Stomach/Bowel: Stomach is unremarkable. No gastric wall thickening. No evidence of outlet obstruction. Duodenum is normally positioned as is the ligament of Treitz. No small bowel wall thickening. No small bowel dilatation. The terminal ileum is normal. The appendix is not visualized, but there is no edema or inflammation in the region of the cecum. No gross colonic mass. No colonic wall thickening. Vascular/Lymphatic: There is abdominal aortic atherosclerosis without aneurysm. There is no gastrohepatic or hepatoduodenal ligament lymphadenopathy. No retroperitoneal or mesenteric lymphadenopathy. No pelvic sidewall lymphadenopathy. Reproductive: The uterus is surgically  absent. There is no adnexal mass. Other: No intraperitoneal free fluid. Musculoskeletal: Sclerotic lesion posterior left acetabulum is associated with other sclerotic foci in the bony pelvis. Right-sided sclerotic lesion in L1 is  less prominent today showing less central lucency. Sclerotic L4 lesion measures 8 mm today compared to 12 mm when I remeasure on the previous exam. Lytic lesion posterior right iliac bone is similar in size at 2.6 cm but now shows a thick sclerotic rim likely secondary to treatment effects and interval healing. IMPRESSION: 1. Potential interval mixed response to therapy. Left supraclavicular node is progressed while scattered bone lesions appear improved. 2. Interval progression of left supraclavicular lymphadenopathy with stable mediastinal lymphadenopathy. 3. Interval development of new interstitial and consolidative airspace opacity in the paraspinal right lower lobe with associated volume loss. Imaging features are likely related to radiation therapy. 4. The confluent opacity posterior to the fissure on the prior study has decreased in the interval with similar appearance of a 13 mm spiculated nodular component in the right lower lobe. New 8 mm right lower lobe pulmonary nodule. Close attention on follow-up recommended as metastatic disease not excluded. 5. Stable appearance of mediastinal lymphadenopathy. 6. Scattered small ground-glass opacities in the lungs bilaterally, similar to prior. Attention on follow-up recommended. 7. Interval T8 vertebral augmentation. 8. Aortic Atherosclerosis (ICD10-I70.0). Electronically Signed   By: Kennith Center M.D.   On: 03/22/2020 13:48   CT Abdomen Pelvis W Contrast  Result Date: 03/22/2020 CLINICAL DATA:  Right-sided lung cancer. EXAM: CT CHEST, ABDOMEN, AND PELVIS WITH CONTRAST TECHNIQUE: Multidetector CT imaging of the chest, abdomen and pelvis was performed following the standard protocol during bolus administration of intravenous contrast.  CONTRAST:  OMNIPAQUE IOHEXOL 300 MG/ML  SOLN COMPARISON:  12/29/2019 FINDINGS: CT CHEST FINDINGS Cardiovascular: Heart size upper normal. Trace pericardial effusion similar to prior. Coronary artery calcification is evident. Atherosclerotic calcification is noted in the wall of the thoracic aorta. Right Port-A-Cath tip is positioned in the distal SVC. Mediastinum/Nodes: 6 mm left supraclavicular node identified on the previous study is now 14 mm on image 8/2. Stable mediastinal lymphadenopathy. Index right paratracheal node is stable at 6 mm short axis. Index node measured subcarinal station on prior study at 12 mm is 12 mm today. There is no hilar lymphadenopathy. The esophagus has normal imaging features. There is no axillary lymphadenopathy. Lungs/Pleura: Interval development of new interstitial and consolidative airspace opacity in the paraspinal right lower lobe with associated volume loss. The confluent opacity posterior to the fissure on the prior study has decreased in the interval with similar appearance of a 13 mm spiculated nodular component in the right lower lobe on 84/4. 8 mm nodule in the right lower lobe on 69/4 is new in the interval. Scattered small ground-glass opacities are noted in the lungs bilaterally, similar to prior. New atelectasis is noted in the medial left lower lobe. No pleural effusion. Musculoskeletal: T8 vertebral augmentation is new in the interval. T7 and T10 sclerotic lesions identified previously are stable. CT ABDOMEN PELVIS FINDINGS Hepatobiliary: No suspicious focal abnormality within the liver parenchyma. Trace intrahepatic biliary duct prominence is stable. Gallbladder is surgically absent. Common bile duct measures 6 mm diameter in the head of pancreas, upper normal. Pancreas: No focal mass lesion. No dilatation of the main duct. No intraparenchymal cyst. No peripancreatic edema. Spleen: No splenomegaly. No focal mass lesion. Adrenals/Urinary Tract: No adrenal nodule  or mass. Kidneys unremarkable. No evidence for hydroureter. The urinary bladder appears normal for the degree of distention. Stomach/Bowel: Stomach is unremarkable. No gastric wall thickening. No evidence of outlet obstruction. Duodenum is normally positioned as is the ligament of Treitz. No small bowel wall thickening. No small bowel dilatation. The  terminal ileum is normal. The appendix is not visualized, but there is no edema or inflammation in the region of the cecum. No gross colonic mass. No colonic wall thickening. Vascular/Lymphatic: There is abdominal aortic atherosclerosis without aneurysm. There is no gastrohepatic or hepatoduodenal ligament lymphadenopathy. No retroperitoneal or mesenteric lymphadenopathy. No pelvic sidewall lymphadenopathy. Reproductive: The uterus is surgically absent. There is no adnexal mass. Other: No intraperitoneal free fluid. Musculoskeletal: Sclerotic lesion posterior left acetabulum is associated with other sclerotic foci in the bony pelvis. Right-sided sclerotic lesion in L1 is less prominent today showing less central lucency. Sclerotic L4 lesion measures 8 mm today compared to 12 mm when I remeasure on the previous exam. Lytic lesion posterior right iliac bone is similar in size at 2.6 cm but now shows a thick sclerotic rim likely secondary to treatment effects and interval healing. IMPRESSION: 1. Potential interval mixed response to therapy. Left supraclavicular node is progressed while scattered bone lesions appear improved. 2. Interval progression of left supraclavicular lymphadenopathy with stable mediastinal lymphadenopathy. 3. Interval development of new interstitial and consolidative airspace opacity in the paraspinal right lower lobe with associated volume loss. Imaging features are likely related to radiation therapy. 4. The confluent opacity posterior to the fissure on the prior study has decreased in the interval with similar appearance of a 13 mm spiculated  nodular component in the right lower lobe. New 8 mm right lower lobe pulmonary nodule. Close attention on follow-up recommended as metastatic disease not excluded. 5. Stable appearance of mediastinal lymphadenopathy. 6. Scattered small ground-glass opacities in the lungs bilaterally, similar to prior. Attention on follow-up recommended. 7. Interval T8 vertebral augmentation. 8. Aortic Atherosclerosis (ICD10-I70.0). Electronically Signed   By: Misty Stanley M.D.   On: 03/22/2020 13:48   DG C-Arm 1-60 Min  Result Date: 03/15/2020 CLINICAL DATA:  Elective surgery. EXAM: THORACIC SPINE 2 VIEWS; DG C-ARM 1-60 MIN COMPARISON:  CT 12/29/2019. FINDINGS: Thoracic spine cannot be numbered due to positioning. Lower thoracic vertebroplasty. Methylmethacrylate noted within and adjacent to the treated vertebral body. IMPRESSION: Postsurgical changes thoracic spine. Electronically Signed   By: Marcello Moores  Register   On: 03/15/2020 14:28   VAS [name] LOWER EXTREMITY VENOUS (DVT) (ONLY [facility] & [facility] 7a-7p)  Result Date: 03/19/2020  Lower Venous DVTStudy Indications: Edema, and History of left lower extremity DVT.  Comparison Study: 12/24/2019- age indeterminate left PTV and peroneal vein DVT Performing Technologist: Maudry Mayhew MHA, RDMS, RVT, RDCS  Examination Guidelines: A complete evaluation includes B-mode imaging, spectral Doppler, color Doppler, and power Doppler as needed of all accessible portions of each vessel. Bilateral testing is considered an integral part of a complete examination. Limited examinations for reoccurring indications may be performed as noted. The reflux portion of the exam is performed with the patient in reverse Trendelenburg.  +---------+---------------+---------+-----------+----------+--------------+ RIGHT    CompressibilityPhasicitySpontaneityPropertiesThrombus Aging +---------+---------------+---------+-----------+----------+--------------+ CFV      Full           Yes                                           +---------+---------------+---------+-----------+----------+--------------+ SFJ      Full                                                        +---------+---------------+---------+-----------+----------+--------------+  FV Prox  Full                                                        +---------+---------------+---------+-----------+----------+--------------+ FV Mid   Full                                                        +---------+---------------+---------+-----------+----------+--------------+ FV DistalFull                                                        +---------+---------------+---------+-----------+----------+--------------+ PFV      Full                                                        +---------+---------------+---------+-----------+----------+--------------+ POP      Full           Yes      Yes                                 +---------+---------------+---------+-----------+----------+--------------+ PTV      Full                                                        +---------+---------------+---------+-----------+----------+--------------+ PERO     Full                                                        +---------+---------------+---------+-----------+----------+--------------+   Right Technical Findings: Not visualized segments include Limited visualization right peroneal veins.  +----+---------------+---------+-----------+----------+--------------+ LEFTCompressibilityPhasicitySpontaneityPropertiesThrombus Aging +----+---------------+---------+-----------+----------+--------------+ CFV Full           Yes      Yes                                 +----+---------------+---------+-----------+----------+--------------+     Summary: RIGHT: - There is no evidence of deep vein thrombosis in the lower extremity. However, portions of this examination were limited- see technologist comments above.   - No cystic structure found in the popliteal fossa.  LEFT: - No evidence of common femoral vein obstruction.  *See table(s) above for measurements and observations. Electronically signed by Coral Else MD on 03/19/2020 at 6:01:23 PM.    Final     ASSESSMENT & PLAN Virginia [name] 64 y.o. female with medical history significant for metastatic adenocarcinoma of the lung presents for a follow up visit. [facility]  testing has shown she has a MET Exon 14 mutation and TPS of 90%.   Today Mrs. Poblete reports no nausea on Zyprexa therapy.  Fortunately she has been able to wean off most of her Compazine with this.  Her pain is well controlled when she is not moving, however it can flare up with activity.  She is also taking oxycodone as needed. A long-acting has been able to be added by neurosurgery, which she notes she will start today or tomorrow  Previously we discussed Carboplatin/Pembrolizumab/Pemetrexed and the expected side effect from these medications.  We discussed the possible side effects of immunotherapy including colitis, hepatitis, dermatitis, and pneumonitis.  Additionally we discussed the side effects of carboplatin which include suppression of blood counts, nephrotoxicity, and nausea.  Additionally we discussed pemetrexed which can also have effects on counts and would require supplementation with vitamin W09 and folic acid.  She voiced her understanding of these side effects and treatment plans moving forward.  I also noted that we would be able to drop the chemotherapy agents that she had intolerance to continue with pembrolizumab alone given her high TPS score.  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 This can be started if patient has progression or intolerance on oral therapy.  --patient will be followed every 3 weeks with labs and clinic visit. --port  in place, patient will require chemotherapy education.  --allow for washout period with capmatinib prior to start of carbo/pem/pemb.  --plan for repeat CT C/A/P in Oct 2021 (3 months from last scan in July 2021) --supportive therapy as listed below.  --RTC q 3 weeks for continued monitoring.  Have return to lab in 1 weeks time to assess blood counts.   #Nausea/Vomiting -- provided patient with Zofran 4-'8mg'$  q8h PRN and compazine '10mg'$  q6H for breakthrough PRN --continue olanzpaine 2.'5mg'$  PO QHS to help with nausea. Marked improvement since the addition of this medication on 01/29/2020.  --can consider Reglan if nausea worsens as it may be gastric motility issues related to her pain medication causing her symptoms.  --continue to monitor   #Hypokalemia, stable --likely 2/2 to decreased PO intake, hypovolemia, and BP medications --given her decreased BP would recommend holding losartan and hydrochlorothiazide --continue to monitor  #Pain Control, improving --continue oxycodone '5mg'$  q4H PRN. Can increase dose to '10mg'$  q6H if pain is severe --added MS Contin '30mg'$  q12 H scheduled to her regimen for long acting support.  --continue to take with Senokot to prevent opioid induced constipation. 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  --nausea as above   #Headache, improved --appreciate the assistance of Dr. 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 --radiation therapy to the brain completed on 11/16/2019.  --continue to monitor   No orders of the defined types were placed in this encounter.  All questions were answered. The patient knows to call the clinic with any problems, questions or concerns.  A total of more than 30 minutes were spent on this encounter and over half of that time was spent on counseling and coordination of care as outlined above.   Ledell Peoples,  MD Department of Hematology/Oncology [place] at [facility] Phone: [phone] Pager: [phone] Email: [email or web address]  04/08/2020 8:09 AM  Literature Support:  Lorenza Chick, Rodrguez-Abreu D, Gadgeel S, Esteban E, Felip E, [place] F, Domine M, [place], Hochmair MJ, [place], [place], Bischoff HG, [place], Grossi F, [place], Reck M, [place], [place], [place], Rubio-Viqueira B, Novello S, Kurata T, Gray JE, Vida J, Wei Z, Yang J, Raftopoulos H, [place], [place] [employer]; [id number] Investigators. Pembrolizumab plus Chemotherapy in Metastatic Non-Small-Cell Lung Cancer. [employer] Corning Med. 2018 May 31;378(22):2078-2092.  --Median progression-free survival was 8.8 months (95% CI, 7.6 to 9.2) in the pembrolizumab-combination group and 4.9 months (95% CI, 4.7 to 5.5) in the placebo-combination group (hazard ratio for disease progression or death, 0.52; 95% CI, 0.43 to 0.64; P<0.001). Adverse events of grade 3 or higher occurred in 67.2% of the patients in the pembrolizumab-combination group and in 65.8% of those in the placebo-combination group.  Jodene Nam L. Efficacy of pemetrexed-based regimens in advanced non-small cell lung cancer patients with activating epidermal growth factor receptor mutations after tyrosine kinase inhibitor failure: a systematic review. Onco Targets Ther. 2018;11:2121-2129.   --The weighted median PFS, median OS, and ORR for patients treated with pem regimens were 5.09 months, 15.91 months, and 30.19%, respectively. Our systematic review results showed a favorable efficacy profile of pem regimens in NSCLC patients with EGFR mutation after EGFR-TKI failure.

## 2020-04-11 ENCOUNTER — Telehealth: Payer: Self-pay | Admitting: Hematology and Oncology

## 2020-04-11 ENCOUNTER — Encounter: Payer: Self-pay | Admitting: Hematology and Oncology

## 2020-04-11 ENCOUNTER — Telehealth: Payer: Self-pay | Admitting: *Deleted

## 2020-04-11 NOTE — Progress Notes (Signed)
Pt was approved w/ [employer] program for $25,000 for Alimta from 4/01/21through 04/08/21. Pt's copay will be $25 each infusion.

## 2020-04-11 NOTE — Telephone Encounter (Signed)
Scheduled per los. Called and spoke with patients husband. Confirmed appt

## 2020-04-14 ENCOUNTER — Encounter: Payer: Self-pay | Admitting: Hematology and Oncology

## 2020-04-14 NOTE — Progress Notes (Signed)
Pthas been enrolledw/ [employer] for [employer] for $25,000 from 09/11/19- 09/09/20.Her copay for Beryle Flock will be $25.

## 2020-04-15 ENCOUNTER — Inpatient Hospital Stay: Payer: BC Managed Care – PPO

## 2020-04-15 ENCOUNTER — Inpatient Hospital Stay: Payer: BC Managed Care – PPO | Attending: Hematology and Oncology

## 2020-04-15 ENCOUNTER — Telehealth: Payer: Self-pay | Admitting: *Deleted

## 2020-04-15 ENCOUNTER — Other Ambulatory Visit: Payer: Self-pay | Admitting: *Deleted

## 2020-04-15 ENCOUNTER — Other Ambulatory Visit: Payer: Self-pay

## 2020-04-15 DIAGNOSIS — Z79899 Other long term (current) drug therapy: Secondary | ICD-10-CM | POA: Insufficient documentation

## 2020-04-15 DIAGNOSIS — K0889 Other specified disorders of teeth and supporting structures: Secondary | ICD-10-CM | POA: Diagnosis not present

## 2020-04-15 DIAGNOSIS — R112 Nausea with vomiting, unspecified: Secondary | ICD-10-CM | POA: Insufficient documentation

## 2020-04-15 DIAGNOSIS — R519 Headache, unspecified: Secondary | ICD-10-CM | POA: Diagnosis not present

## 2020-04-15 DIAGNOSIS — C7931 Secondary malignant neoplasm of brain: Secondary | ICD-10-CM | POA: Insufficient documentation

## 2020-04-15 DIAGNOSIS — Z9049 Acquired absence of other specified parts of digestive tract: Secondary | ICD-10-CM | POA: Diagnosis not present

## 2020-04-15 DIAGNOSIS — Z923 Personal history of irradiation: Secondary | ICD-10-CM | POA: Insufficient documentation

## 2020-04-15 DIAGNOSIS — Z5111 Encounter for antineoplastic chemotherapy: Secondary | ICD-10-CM | POA: Diagnosis not present

## 2020-04-15 DIAGNOSIS — Z7901 Long term (current) use of anticoagulants: Secondary | ICD-10-CM | POA: Insufficient documentation

## 2020-04-15 DIAGNOSIS — C7951 Secondary malignant neoplasm of bone: Secondary | ICD-10-CM

## 2020-04-15 DIAGNOSIS — M549 Dorsalgia, unspecified: Secondary | ICD-10-CM | POA: Diagnosis not present

## 2020-04-15 DIAGNOSIS — R05 Cough: Secondary | ICD-10-CM | POA: Insufficient documentation

## 2020-04-15 DIAGNOSIS — Z86718 Personal history of other venous thrombosis and embolism: Secondary | ICD-10-CM | POA: Diagnosis not present

## 2020-04-15 DIAGNOSIS — Z5112 Encounter for antineoplastic immunotherapy: Secondary | ICD-10-CM | POA: Insufficient documentation

## 2020-04-15 DIAGNOSIS — C3490 Malignant neoplasm of unspecified part of unspecified bronchus or lung: Secondary | ICD-10-CM

## 2020-04-15 DIAGNOSIS — R062 Wheezing: Secondary | ICD-10-CM | POA: Insufficient documentation

## 2020-04-15 DIAGNOSIS — E876 Hypokalemia: Secondary | ICD-10-CM | POA: Insufficient documentation

## 2020-04-15 DIAGNOSIS — M7989 Other specified soft tissue disorders: Secondary | ICD-10-CM | POA: Diagnosis not present

## 2020-04-15 DIAGNOSIS — C3431 Malignant neoplasm of lower lobe, right bronchus or lung: Secondary | ICD-10-CM | POA: Insufficient documentation

## 2020-04-15 LAB — CBC WITH DIFFERENTIAL (CANCER CENTER ONLY)
Abs Immature Granulocytes: 0.01 10*3/uL (ref 0.00–0.07)
Basophils Absolute: 0 10*3/uL (ref 0.0–0.1)
Basophils Relative: 0 %
Eosinophils Absolute: 0.1 10*3/uL (ref 0.0–0.5)
Eosinophils Relative: 4 %
HCT: 30.6 % — ABNORMAL LOW (ref 36.0–46.0)
Hemoglobin: 10.5 g/dL — ABNORMAL LOW (ref 12.0–15.0)
Immature Granulocytes: 0 %
Lymphocytes Relative: 38 %
Lymphs Abs: 1.2 10*3/uL (ref 0.7–4.0)
MCH: 31.2 pg (ref 26.0–34.0)
MCHC: 34.3 g/dL (ref 30.0–36.0)
MCV: 90.8 fL (ref 80.0–100.0)
Monocytes Absolute: 0.4 10*3/uL (ref 0.1–1.0)
Monocytes Relative: 13 %
Neutro Abs: 1.4 10*3/uL — ABNORMAL LOW (ref 1.7–7.7)
Neutrophils Relative %: 45 %
Platelet Count: 169 10*3/uL (ref 150–400)
RBC: 3.37 MIL/uL — ABNORMAL LOW (ref 3.87–5.11)
RDW: 12.4 % (ref 11.5–15.5)
WBC Count: 3.1 10*3/uL — ABNORMAL LOW (ref 4.0–10.5)
nRBC: 0 % (ref 0.0–0.2)

## 2020-04-15 LAB — CMP (CANCER CENTER ONLY)
ALT: 61 U/L — ABNORMAL HIGH (ref 0–44)
AST: 34 U/L (ref 15–41)
Albumin: 3.3 g/dL — ABNORMAL LOW (ref 3.5–5.0)
Alkaline Phosphatase: 84 U/L (ref 38–126)
Anion gap: 7 (ref 5–15)
BUN: 12 mg/dL (ref 8–23)
CO2: 24 mmol/L (ref 22–32)
Calcium: 9.3 mg/dL (ref 8.9–10.3)
Chloride: 108 mmol/L (ref 98–111)
Creatinine: 0.56 mg/dL (ref 0.44–1.00)
GFR, Est AFR Am: >60 mL/min (ref >60)
GFR, Estimated: >60 mL/min (ref >60)
Glucose, Bld: 89 mg/dL (ref 70–99)
Potassium: 4.1 mmol/L (ref 3.5–5.1)
Sodium: 139 mmol/L (ref 135–145)
Total Bilirubin: 0.3 mg/dL (ref 0.3–1.2)
Total Protein: 6.2 g/dL — ABNORMAL LOW (ref 6.5–8.1)

## 2020-04-15 LAB — TSH: TSH: 1.02 u[IU]/mL (ref 0.308–3.960)

## 2020-04-15 MED ORDER — SODIUM CHLORIDE 0.9% FLUSH
10.0000 mL | Freq: Once | INTRAVENOUS | Status: AC | PRN
  Administered 2020-04-15: 10 mL
  Filled 2020-04-15: qty 10

## 2020-04-15 MED ORDER — HEPARIN SOD (PORK) LOCK FLUSH 100 UNIT/ML IV SOLN
500.0000 [IU] | Freq: Once | INTRAVENOUS | Status: AC | PRN
  Administered 2020-04-15: 500 [IU]
  Filled 2020-04-15: qty 5

## 2020-04-15 NOTE — Telephone Encounter (Signed)
Received vm message from pt's husband, [name]. He is requesting refill on her Oxycodone 5 mg tabs. Last filled for #90 on 03/23/20

## 2020-04-18 ENCOUNTER — Other Ambulatory Visit: Payer: Self-pay | Admitting: Hematology and Oncology

## 2020-04-18 ENCOUNTER — Telehealth: Payer: Self-pay | Admitting: *Deleted

## 2020-04-18 MED FILL — oxyCODONE HCL 5 MG TABS: 5 | 8 days supply | Qty: 90 | Fill #0

## 2020-04-18 NOTE — Telephone Encounter (Signed)
Received vm message from pt's husband regarding oxycodone refill and recent lab results.  Spoke with Virginia [name] and advised that the oxycodone was  Refilled to today to [name].  Virginia [name] states that she is now taking the long acting oxycodone, Xtampza every 12 hours and is now only taking breakthrough pain meds 3 x a day and is feeling better overall.  Reviewed recent lab results with him and advised they were as expected.  She will have repeat labs on 04/21/20. They are aware of upcoming appts on 04/21/20

## 2020-04-20 ENCOUNTER — Other Ambulatory Visit: Payer: Self-pay | Admitting: Hematology and Oncology

## 2020-04-20 DIAGNOSIS — C7951 Secondary malignant neoplasm of bone: Secondary | ICD-10-CM

## 2020-04-20 DIAGNOSIS — C3490 Malignant neoplasm of unspecified part of unspecified bronchus or lung: Secondary | ICD-10-CM

## 2020-04-20 NOTE — Progress Notes (Signed)
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 CT of the head showed showed concerning for metastatic disease involving the left cerebellum and right occipital lobe.  2) 09/30/2019: MRI brain confirms mass within the left cerebellum measures 1.6 x 1.3 x 1.5 cm as well as at least 8 metastatic lesions within the supratentorial and infratentorial brain as outlined 3) 10/01/2019: PET CT scan revealed hypermetabolic infrahilar right lower lobe mass with hypermetabolic nodal metastases in the right hilum, mediastinum, right supraclavicular region, left axilla and lower left neck. 4) 10/08/2019: US guided biopsy of the lymph nodes confirms poorly differentiated non-small cell carcinoma. Immunohistochemistry confirms adenocarcinoma of lung primary. PD-L1 and NGS later revealed a MET Exon 14 mutation and TPS of 90%.  5) 10/12/2019: Establish care with Dr. Lorenso Courier  6) 11/04/2019: Day 1 of capmatinib '400mg'$  BID 7) 11/09/2019: presented to Rad/Onc with a DVT in LLE. Seen in symptom management clinic and started on apixaban.  8) 12/29/2019: CT C/A/P showed interval decrease in size of mass involving the superior segment of right lower lobe and reduction in mediastinal and left supraclavicular adenopathy though some small spinal lesions were noted in the interim between the two sets of scans 9) 01/25/2020: temporarily stopped capmatinib due to symptoms of nausea/fatigue. Restarted therapy on 01/30/2020 after brief chemo holiday.  10) 03/22/2020: CT C/A/P reveals a mixed picture, with new lung nodule and  increased lymphadenopathy, however response in bone lesions. D/c capmatinib therapy 11) 04/08/2020: start of Carbo/Pem/Pem for 2nd line treatment. Cycle 1 Day 1   12) 04/29/2020: Cycle 2 Day 1 Carbo/Pem/Pem   Interval History:  Virginia [name] 64 y.o. female with medical history significant for metastatic adenocarcinoma of the lung presents for a follow up visit. The patient's last visit was on 04/08/2020 at which time the patient started Cycle 1 Day 1 of Carbo/Pem/Pem. In the interim since the last visit she has had no new symptoms.   On exam today Virginia [name] is that she feels well.  She reports that she tolerated chemotherapy great and that she had wonderful levels of energy on day 2.  She had some sleepiness on day 3 and 4, however she rebounded quite nicely.  She reports that she has had no nausea and is only been taking the Zyprexa nightly.  She does report having a cough productive for yellowish sputum with some wheezing, but no fevers chills, or sweats.  Her bowel movements have been moving routinely.  On further discussion she notes that she recently had a root canal and has lost some weight because of the pain that she is having in her tooth.  Right now the pain in her tooth is the most severe, and she is having back pain which is well controlled currently on her current pain regimen including the long-acting OxyContin.    She continues to note that her anticoagulant therapy has been well-tolerated and that she has not had any issues with bleeding, bruising, or dark stools.  She reports that her leg swelling is stable and she still does have some uncomfortable sensation in the right leg (which is not the one that  has the clot in it).  Overall her health is stable as compared to her last visit.  On further discussing this he notes denies having any issues with fevers, chills, sweats, diarrhea, or constipation.  She notes that her breathing has been good and she has not had any issues with cough,  shortness of breath, or chest pain.  A full 10 point ROS is listed below.  MEDICAL HISTORY:  Past Medical History:  Diagnosis Date  . Anemia   . Anxiety   . Concussion 09/28/2019  . DVT (deep venous thrombosis) ([place]) 2021   L leg  . Dyspnea   . GERD (gastroesophageal reflux disease)   . Hypercholesterolemia    per pt, she does not have elevated lipids  . Hypertension   . met lung ca dx'd 09/2019   mets to spine, hip and brain  . PONV (postoperative nausea and vomiting)   . Tobacco abuse     SURGICAL HISTORY: Past Surgical History:  Procedure Laterality Date  . ABDOMINAL HYSTERECTOMY     partial/ left ovaries  . CHOLECYSTECTOMY    . DILATION AND CURETTAGE OF UTERUS    . IR IMAGING GUIDED PORT INSERTION  10/23/2019  . KYPHOPLASTY N/A 03/15/2020   Procedure: Thoracic Eight KYPHOPLASTY;  Surgeon: Erline Levine, MD;  Location: [facility];  Service: Neurosurgery;  Laterality: N/A;  prone   . LIPOMA EXCISION  2018   removed under left breast and right thigh.  . TUBAL LIGATION      ALLERGIES:  has No Known Allergies.  MEDICATIONS:  Current Outpatient Medications  Medication Sig Dispense Refill  . albuterol (VENTOLIN HFA) 108 (90 Base) MCG/ACT inhaler Inhale 2 puffs into the lungs every 6 (six) hours as needed for wheezing or shortness of breath. 6.7 g 11  . ALPRAZolam (XANAX) 0.25 MG tablet Take 1 tablet (0.25 mg total) by mouth 2 (two) times daily as needed. for anxiety (Patient taking differently: Take 0.25 mg by mouth 2 (two) times daily as needed for anxiety. ) 60 tablet 5  . amoxicillin-clavulanate (AUGMENTIN) 500-125 MG tablet Take by mouth.    . Cyanocobalamin (VITAMIN B12 PO) Take 2 tablets by mouth daily. Gummies    . ELIQUIS 5 MG TABS tablet TAKE ONE TABLET BY MOUTH TWICE A DAY (Patient taking differently: Take 5 mg by mouth 2 (two) times daily. ) 60 tablet 1  . folic acid (FOLVITE) 1 MG tablet Take 1 tablet (1 mg total) by mouth daily. 90 tablet 1  . furosemide (LASIX) 20 MG  tablet 20 mg once daily for 3 days, then stop. May repeat if edema recurs. (Patient taking differently: Take 20 mg by mouth See admin instructions. 20 mg once daily for 3 days, then stop. Daily as needed  May repeat if edema recurs.) 30 tablet 1  . glycerin adult 2 g suppository Place 1 suppository rectally as needed for constipation. 12 suppository 0  . lidocaine-prilocaine (EMLA) cream Apply 1 application topically as needed. Apply to port as needed. (Patient taking differently: Apply 1 application topically as needed (Apply to port as needed.). ) 30 g 0  . methocarbamol (ROBAXIN) 500 MG tablet Take 500 mg by mouth 3 (three) times daily as needed.    . Multiple Vitamins-Minerals (AIRBORNE PO) Take 1 tablet by mouth daily.     Marland Kitchen ofloxacin (OCUFLOX) 0.3 % ophthalmic solution 2 drops each eye 4 times a day for 5 days (Patient not taking: Reported on 03/09/2020) 5 mL 1  . OLANZapine (  ZYPREXA) 2.5 MG tablet Take 1 tablet (2.5 mg total) by mouth at bedtime. 30 tablet 1  . oxyCODONE (OXY IR/ROXICODONE) 5 MG immediate release tablet TAKE 1 TO 2 TABLETS BY MOUTH EVERY 4 HOURS AS NEEDED FOR SEVERE OR BREAKTHROUGH PAIN 90 tablet 0  . pantoprazole (PROTONIX) 40 MG tablet TAKE 1 TABLET(40 MG) BY MOUTH DAILY (Patient taking differently: Take 40 mg by mouth daily. ) 90 tablet 3  . polyethylene glycol (MIRALAX / GLYCOLAX) 17 g packet Take 17 g by mouth 2 (two) times daily.     . prochlorperazine (COMPAZINE) 10 MG tablet TAKE 1 TABLET BY MOUTH EVERY 6 HOURS AS NEEDED FOR NAUSEA OR VOMITING (Patient not taking: Reported on 02/25/2020) 30 tablet 0  . sennosides-docusate sodium (SENOKOT-S) 8.6-50 MG tablet Take 2-3 tablets by mouth See admin instructions. 2 in the morning and 3 at night. Adjust as needed    . sucralfate (CARAFATE) 1 g tablet Dissolve 1 tablet in 10 mL H20 and swallow 30 min prior to meals and bedtime. (Patient not taking: Reported on 02/25/2020) 50 tablet 3  . Vitamin D3 (VITAMIN D) 25 MCG tablet Take  2,000 Units by mouth daily.    Marland Kitchen XTAMPZA ER 13.5 MG C12A Take 1 capsule by mouth every 12 (twelve) hours.     No current facility-administered medications for this visit.    REVIEW OF SYSTEMS:   Constitutional: ( - ) fevers, ( - )  chills , ( - ) night sweats Eyes: ( - ) blurriness of vision, ( - ) double vision, ( - ) watery eyes Ears, nose, mouth, throat, and face: ( - ) mucositis, ( - ) sore throat Respiratory: ( - ) cough, ( - ) dyspnea, ( - ) wheezes Cardiovascular: ( - ) palpitation, ( - ) chest discomfort, ( - ) lower extremity swelling Gastrointestinal:  ( - ) nausea, ( - ) heartburn, ( - ) change in bowel habits Skin: ( - ) abnormal skin rashes Lymphatics: ( - ) new lymphadenopathy, ( - ) easy bruising Neurological: ( - ) numbness, ( - ) tingling, ( - ) new weaknesses Behavioral/Psych: ( - ) mood change, ( - ) new changes  All other systems were reviewed with the patient and are negative.  PHYSICAL EXAMINATION: ECOG PERFORMANCE STATUS: 1 - Symptomatic but completely ambulatory  Vitals:   04/21/20 1130  BP: (!) 115/93  Pulse: (!) 102  Resp: 18  Temp: 98.1 F (36.7 C)  SpO2: 98%   Filed Weights   04/21/20 1130  Weight: 131 lb 11.2 oz (59.7 kg)    GENERAL: well appearing middle aged Caucasian female in NAD  SKIN: skin color, texture, turgor are normal, no rashes or significant lesions EYES: conjunctiva are pink and non-injected, sclera clear LUNGS: clear to auscultation and percussion with normal breathing effort HEART: regular rate & rhythm and no murmurs and bilateral + trace pitting lower extremity edema Musculoskeletal: no cyanosis of digits and no clubbing PSYCH: alert & oriented x 3, fluent speech NEURO: no focal motor/sensory deficits   LABORATORY DATA:  I have reviewed the data as listed CBC Latest Ref Rng & Units 04/21/2020 04/15/2020 04/08/2020  WBC 4.0 - 10.5 K/uL 6.1 3.1(L) 5.5  Hemoglobin 12.0 - 15.0 g/dL 11.2(L) 10.5(L) 11.8(L)  Hematocrit 36 - 46 %  32.8(L) 30.6(L) 34.7(L)  Platelets 150 - 400 K/uL 275 169 248    CMP Latest Ref Rng & Units 04/21/2020 04/15/2020 04/08/2020  Glucose 70 - 99 mg/dL 95  89 101(H)  BUN 8 - 23 mg/dL '9 12 9  '$ Creatinine 0.44 - 1.00 mg/dL 0.58 0.56 0.63  Sodium 135 - 145 mmol/L 138 139 142  Potassium 3.5 - 5.1 mmol/L 4.2 4.1 3.7  Chloride 98 - 111 mmol/L 106 108 109  CO2 22 - 32 mmol/L '23 24 26  '$ Calcium 8.9 - 10.3 mg/dL 9.6 9.3 9.5  Total Protein 6.5 - 8.1 g/dL 6.5 6.2(L) 6.0(L)  Total Bilirubin 0.3 - 1.2 mg/dL 0.2(L) 0.3 0.4  Alkaline Phos 38 - 126 U/L 92 84 76  AST 15 - 41 U/L 31 34 17  ALT 0 - 44 U/L 55(H) 61(H) 30     RADIOGRAPHIC STUDIES: I personally have viewed the radiographic studies below: CT C/A/P showed increased lymphadenopathy of supraclavicular node with new lung nodule. Bone lesions responding. Evidence of T8 kyphoplasty. No results found.  ASSESSMENT & PLAN Virginia [name] 65 y.o. female with medical history significant for metastatic adenocarcinoma of the lung presents for a follow up visit. 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   Today Virginia [name] reports she is tolerating treatment well with no clear side effects.  She is having some cough at this time which is mild and not associated with a fever, change in vitals, or leukocytosis.  She has some mild wheezing noted on chest exam but is otherwise quite well.  Previously we discussed Carboplatin/Pembrolizumab/Pemetrexed and the expected side effect from these medications.  We discussed the possible side effects of immunotherapy including colitis, hepatitis, dermatitis, and pneumonitis.  Additionally we discussed the side effects of carboplatin which include suppression of blood counts, nephrotoxicity, and nausea.  Additionally we discussed pemetrexed which can also have effects on counts and would require supplementation with vitamin Z30 and folic acid.  She voiced her understanding of these side  effects and treatment plans moving forward.  I also noted that we would be able to drop the chemotherapy agents that she had intolerance to continue with pembrolizumab alone given her high TPS score.  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   --patient will be followed every 3 weeks with labs and clinic visit. --port in place, patient will require chemotherapy education.  --plan for repeat CT C/A/P in Oct 2021 (3 months from last scan in July 2021) --supportive therapy as listed below.  --RTC q 3 weeks for continued monitoring.    #Nausea/Vomiting -- provided patient with Zofran 4-'8mg'$  q8h PRN and compazine '10mg'$  q6H for breakthrough PRN --continue olanzpaine 2.'5mg'$  PO QHS to help with nausea. Marked improvement since the addition of this medication on 01/29/2020.   --continue to monitor   #Hypokalemia, stable --likely 2/2 to decreased PO intake, hypovolemia, and BP medications --given her decreased BP would recommend holding losartan and hydrochlorothiazide --continue to monitor  #Pain Control, improving --continue oxycodone '5mg'$  q4H PRN. Can increase dose to '10mg'$  q6H if pain is severe --added MS Contin '30mg'$  q12 H scheduled to her regimen for long acting support.  --continue to take with Senokot to prevent opioid induced constipation. 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  --nausea as above   #Headache, improved --appreciate the assistance of Dr. Mickeal Skinner in treating her brain  metastasis and symptoms. --radiation therapy to the brain completed on 11/16/2019.  --MRI brain scheduled on 04/29/2020.  --continue to monitor   No orders of the defined types were placed in this encounter.  All questions were answered. The patient knows to call  the clinic with any problems, questions or concerns.  A total of more than 30 minutes were spent on this encounter and over half of that time was spent on counseling and coordination of care as outlined above.   Ledell Peoples, MD Department of Hematology/Oncology [place] at [facility] Phone: [phone] Pager: [phone] Email: [email or web address]  04/21/2020 12:49 PM   Literature Support:  Lorenza Chick, Rodrguez-Abreu D, [place]uffy Rhody, Felip E, [place] F, Domine M, [place], Hochmair MJ, [place], [place], Bischoff HG, [place], Grossi F, [place], Reck M, [place], [place], [place], Rubio-Viqueira B, Novello S, Kurata T, Gray JE, Vida J, Wei Z, Yang J, Raftopoulos H, [place], [place] [employer]; [id number] Investigators. Pembrolizumab plus Chemotherapy in Metastatic Non-Small-Cell Lung Cancer. [employer]ta Corning Med. 2018 May 31;378(22):2078-2092.  --Median progression-free survival was 8.8 months (95% CI, 7.6 to 9.2) in the pembrolizumab-combination group and 4.9 months (95% CI, 4.7 to 5.5) in the placebo-combination group (hazard ratio for disease progression or death, 0.52; 95% CI, 0.43 to 0.64; P<0.001). Adverse events of grade 3 or higher occurred in 67.2% of the patients in the pembrolizumab-combination group and in 65.8% of those in the placebo-combination group.  Jodene Nam L. Efficacy of pemetrexed-based regimens in advanced non-small cell lung cancer patients with activating epidermal growth factor receptor mutations after tyrosine kinase inhibitor failure: a systematic review. Onco Targets Ther. 2018;11:2121-2129.   --The weighted median PFS, median OS, and ORR for patients treated with pem regimens were 5.09 months, 15.91 months, and 30.19%, respectively. Our systematic review results showed a favorable efficacy profile of pem regimens in NSCLC patients with EGFR mutation after EGFR-TKI failure.

## 2020-04-21 ENCOUNTER — Inpatient Hospital Stay (HOSPITAL_BASED_OUTPATIENT_CLINIC_OR_DEPARTMENT_OTHER): Payer: BC Managed Care – PPO | Admitting: Hematology and Oncology

## 2020-04-21 ENCOUNTER — Other Ambulatory Visit: Payer: Self-pay

## 2020-04-21 ENCOUNTER — Inpatient Hospital Stay: Payer: BC Managed Care – PPO

## 2020-04-21 ENCOUNTER — Encounter: Payer: Self-pay | Admitting: Hematology and Oncology

## 2020-04-21 VITALS — BP 115/93 | HR 102 | Temp 98.1°F | Resp 18 | Ht 62.0 in | Wt 131.7 lb

## 2020-04-21 DIAGNOSIS — C7931 Secondary malignant neoplasm of brain: Secondary | ICD-10-CM | POA: Diagnosis not present

## 2020-04-21 DIAGNOSIS — Z5111 Encounter for antineoplastic chemotherapy: Secondary | ICD-10-CM | POA: Diagnosis not present

## 2020-04-21 DIAGNOSIS — C7951 Secondary malignant neoplasm of bone: Secondary | ICD-10-CM

## 2020-04-21 DIAGNOSIS — R062 Wheezing: Secondary | ICD-10-CM | POA: Diagnosis not present

## 2020-04-21 DIAGNOSIS — K0889 Other specified disorders of teeth and supporting structures: Secondary | ICD-10-CM | POA: Diagnosis not present

## 2020-04-21 DIAGNOSIS — R519 Headache, unspecified: Secondary | ICD-10-CM | POA: Diagnosis not present

## 2020-04-21 DIAGNOSIS — R05 Cough: Secondary | ICD-10-CM | POA: Diagnosis not present

## 2020-04-21 DIAGNOSIS — R112 Nausea with vomiting, unspecified: Secondary | ICD-10-CM | POA: Diagnosis not present

## 2020-04-21 DIAGNOSIS — M549 Dorsalgia, unspecified: Secondary | ICD-10-CM | POA: Diagnosis not present

## 2020-04-21 DIAGNOSIS — M7989 Other specified soft tissue disorders: Secondary | ICD-10-CM | POA: Diagnosis not present

## 2020-04-21 DIAGNOSIS — E876 Hypokalemia: Secondary | ICD-10-CM | POA: Diagnosis not present

## 2020-04-21 DIAGNOSIS — Z923 Personal history of irradiation: Secondary | ICD-10-CM | POA: Diagnosis not present

## 2020-04-21 DIAGNOSIS — Z86718 Personal history of other venous thrombosis and embolism: Secondary | ICD-10-CM | POA: Diagnosis not present

## 2020-04-21 DIAGNOSIS — Z7901 Long term (current) use of anticoagulants: Secondary | ICD-10-CM | POA: Diagnosis not present

## 2020-04-21 DIAGNOSIS — Z5112 Encounter for antineoplastic immunotherapy: Secondary | ICD-10-CM | POA: Diagnosis not present

## 2020-04-21 DIAGNOSIS — C3491 Malignant neoplasm of unspecified part of right bronchus or lung: Secondary | ICD-10-CM

## 2020-04-21 DIAGNOSIS — C3490 Malignant neoplasm of unspecified part of unspecified bronchus or lung: Secondary | ICD-10-CM | POA: Diagnosis not present

## 2020-04-21 DIAGNOSIS — C3431 Malignant neoplasm of lower lobe, right bronchus or lung: Secondary | ICD-10-CM | POA: Diagnosis not present

## 2020-04-21 DIAGNOSIS — Z79899 Other long term (current) drug therapy: Secondary | ICD-10-CM | POA: Diagnosis not present

## 2020-04-21 LAB — CBC WITH DIFFERENTIAL (CANCER CENTER ONLY)
Abs Immature Granulocytes: 0.02 10*3/uL (ref 0.00–0.07)
Basophils Absolute: 0 10*3/uL (ref 0.0–0.1)
Basophils Relative: 0 %
Eosinophils Absolute: 0.2 10*3/uL (ref 0.0–0.5)
Eosinophils Relative: 3 %
HCT: 32.8 % — ABNORMAL LOW (ref 36.0–46.0)
Hemoglobin: 11.2 g/dL — ABNORMAL LOW (ref 12.0–15.0)
Immature Granulocytes: 0 %
Lymphocytes Relative: 25 %
Lymphs Abs: 1.5 10*3/uL (ref 0.7–4.0)
MCH: 31.1 pg (ref 26.0–34.0)
MCHC: 34.1 g/dL (ref 30.0–36.0)
MCV: 91.1 fL (ref 80.0–100.0)
Monocytes Absolute: 1 10*3/uL (ref 0.1–1.0)
Monocytes Relative: 16 %
Neutro Abs: 3.4 10*3/uL (ref 1.7–7.7)
Neutrophils Relative %: 56 %
Platelet Count: 275 10*3/uL (ref 150–400)
RBC: 3.6 MIL/uL — ABNORMAL LOW (ref 3.87–5.11)
RDW: 12.8 % (ref 11.5–15.5)
WBC Count: 6.1 10*3/uL (ref 4.0–10.5)
nRBC: 0 % (ref 0.0–0.2)

## 2020-04-21 LAB — CMP (CANCER CENTER ONLY)
ALT: 55 U/L — ABNORMAL HIGH (ref 0–44)
AST: 31 U/L (ref 15–41)
Albumin: 3.2 g/dL — ABNORMAL LOW (ref 3.5–5.0)
Alkaline Phosphatase: 92 U/L (ref 38–126)
Anion gap: 9 (ref 5–15)
BUN: 9 mg/dL (ref 8–23)
CO2: 23 mmol/L (ref 22–32)
Calcium: 9.6 mg/dL (ref 8.9–10.3)
Chloride: 106 mmol/L (ref 98–111)
Creatinine: 0.58 mg/dL (ref 0.44–1.00)
GFR, Est AFR Am: >60 mL/min (ref >60)
GFR, Estimated: >60 mL/min (ref >60)
Glucose, Bld: 95 mg/dL (ref 70–99)
Potassium: 4.2 mmol/L (ref 3.5–5.1)
Sodium: 138 mmol/L (ref 135–145)
Total Bilirubin: 0.2 mg/dL — ABNORMAL LOW (ref 0.3–1.2)
Total Protein: 6.5 g/dL (ref 6.5–8.1)

## 2020-04-21 LAB — TSH: TSH: 0.768 u[IU]/mL (ref 0.308–3.960)

## 2020-04-21 MED ORDER — SODIUM CHLORIDE 0.9% FLUSH
10.0000 mL | Freq: Once | INTRAVENOUS | Status: AC | PRN
  Administered 2020-04-21: 10 mL
  Filled 2020-04-21: qty 10

## 2020-04-21 MED ORDER — HEPARIN SOD (PORK) LOCK FLUSH 100 UNIT/ML IV SOLN
500.0000 [IU] | Freq: Once | INTRAVENOUS | Status: AC | PRN
  Administered 2020-04-21: 500 [IU]
  Filled 2020-04-21: qty 5

## 2020-04-21 NOTE — Patient Instructions (Signed)

## 2020-04-22 ENCOUNTER — Telehealth: Payer: Self-pay | Admitting: *Deleted

## 2020-04-22 NOTE — Telephone Encounter (Signed)
Received call from pt's dentist, Dr. Imagene Riches. He states that patient is cleared for Zometa infusions from a dental perspective. He is currently treating her for an abscessed tooth and will be doing a root canal. She is currently on antibiotics.  He wanted Dr. Lorenso Courier to be aware of this. Message sent to Dr. Lorenso Courier.

## 2020-04-27 ENCOUNTER — Other Ambulatory Visit: Payer: Self-pay | Admitting: Medical

## 2020-04-27 DIAGNOSIS — I82452 Acute embolism and thrombosis of left peroneal vein: Secondary | ICD-10-CM

## 2020-04-28 ENCOUNTER — Inpatient Hospital Stay: Payer: BC Managed Care – PPO

## 2020-04-28 ENCOUNTER — Other Ambulatory Visit: Payer: Self-pay | Admitting: *Deleted

## 2020-04-28 ENCOUNTER — Other Ambulatory Visit: Payer: Self-pay

## 2020-04-28 ENCOUNTER — Other Ambulatory Visit: Payer: Self-pay | Admitting: Hematology and Oncology

## 2020-04-28 VITALS — BP 114/95 | HR 82 | Temp 98.3°F | Resp 18 | Wt 135.5 lb

## 2020-04-28 DIAGNOSIS — Z86718 Personal history of other venous thrombosis and embolism: Secondary | ICD-10-CM | POA: Diagnosis not present

## 2020-04-28 DIAGNOSIS — Z79899 Other long term (current) drug therapy: Secondary | ICD-10-CM | POA: Diagnosis not present

## 2020-04-28 DIAGNOSIS — K0889 Other specified disorders of teeth and supporting structures: Secondary | ICD-10-CM | POA: Diagnosis not present

## 2020-04-28 DIAGNOSIS — M7989 Other specified soft tissue disorders: Secondary | ICD-10-CM | POA: Diagnosis not present

## 2020-04-28 DIAGNOSIS — C7931 Secondary malignant neoplasm of brain: Secondary | ICD-10-CM

## 2020-04-28 DIAGNOSIS — M549 Dorsalgia, unspecified: Secondary | ICD-10-CM | POA: Diagnosis not present

## 2020-04-28 DIAGNOSIS — R05 Cough: Secondary | ICD-10-CM | POA: Diagnosis not present

## 2020-04-28 DIAGNOSIS — C7951 Secondary malignant neoplasm of bone: Secondary | ICD-10-CM

## 2020-04-28 DIAGNOSIS — E876 Hypokalemia: Secondary | ICD-10-CM | POA: Diagnosis not present

## 2020-04-28 DIAGNOSIS — Z923 Personal history of irradiation: Secondary | ICD-10-CM | POA: Diagnosis not present

## 2020-04-28 DIAGNOSIS — Z5112 Encounter for antineoplastic immunotherapy: Secondary | ICD-10-CM | POA: Diagnosis not present

## 2020-04-28 DIAGNOSIS — R519 Headache, unspecified: Secondary | ICD-10-CM | POA: Diagnosis not present

## 2020-04-28 DIAGNOSIS — R062 Wheezing: Secondary | ICD-10-CM | POA: Diagnosis not present

## 2020-04-28 DIAGNOSIS — R918 Other nonspecific abnormal finding of lung field: Secondary | ICD-10-CM

## 2020-04-28 DIAGNOSIS — Z7901 Long term (current) use of anticoagulants: Secondary | ICD-10-CM | POA: Diagnosis not present

## 2020-04-28 DIAGNOSIS — R112 Nausea with vomiting, unspecified: Secondary | ICD-10-CM | POA: Diagnosis not present

## 2020-04-28 DIAGNOSIS — Z5111 Encounter for antineoplastic chemotherapy: Secondary | ICD-10-CM | POA: Diagnosis not present

## 2020-04-28 DIAGNOSIS — C3431 Malignant neoplasm of lower lobe, right bronchus or lung: Secondary | ICD-10-CM | POA: Diagnosis not present

## 2020-04-28 LAB — CMP (CANCER CENTER ONLY)
ALT: 31 U/L (ref 0–44)
AST: 23 U/L (ref 15–41)
Albumin: 3.2 g/dL — ABNORMAL LOW (ref 3.5–5.0)
Alkaline Phosphatase: 80 U/L (ref 38–126)
Anion gap: 8 (ref 5–15)
BUN: 10 mg/dL (ref 8–23)
CO2: 24 mmol/L (ref 22–32)
Calcium: 9.3 mg/dL (ref 8.9–10.3)
Chloride: 108 mmol/L (ref 98–111)
Creatinine: 0.57 mg/dL (ref 0.44–1.00)
GFR, Est AFR Am: >60 mL/min (ref >60)
GFR, Estimated: >60 mL/min (ref >60)
Glucose, Bld: 93 mg/dL (ref 70–99)
Potassium: 3.9 mmol/L (ref 3.5–5.1)
Sodium: 140 mmol/L (ref 135–145)
Total Bilirubin: <0.2 mg/dL — ABNORMAL LOW (ref 0.3–1.2)
Total Protein: 6.1 g/dL — ABNORMAL LOW (ref 6.5–8.1)

## 2020-04-28 LAB — CBC WITH DIFFERENTIAL (CANCER CENTER ONLY)
Abs Immature Granulocytes: 0.02 10*3/uL (ref 0.00–0.07)
Basophils Absolute: 0.1 10*3/uL (ref 0.0–0.1)
Basophils Relative: 1 %
Eosinophils Absolute: 0.1 10*3/uL (ref 0.0–0.5)
Eosinophils Relative: 3 %
HCT: 31.2 % — ABNORMAL LOW (ref 36.0–46.0)
Hemoglobin: 10.7 g/dL — ABNORMAL LOW (ref 12.0–15.0)
Immature Granulocytes: 0 %
Lymphocytes Relative: 35 %
Lymphs Abs: 1.6 10*3/uL (ref 0.7–4.0)
MCH: 31.6 pg (ref 26.0–34.0)
MCHC: 34.3 g/dL (ref 30.0–36.0)
MCV: 92 fL (ref 80.0–100.0)
Monocytes Absolute: 0.6 10*3/uL (ref 0.1–1.0)
Monocytes Relative: 13 %
Neutro Abs: 2.1 10*3/uL (ref 1.7–7.7)
Neutrophils Relative %: 48 %
Platelet Count: 331 10*3/uL (ref 150–400)
RBC: 3.39 MIL/uL — ABNORMAL LOW (ref 3.87–5.11)
RDW: 13.2 % (ref 11.5–15.5)
WBC Count: 4.5 10*3/uL (ref 4.0–10.5)
nRBC: 0 % (ref 0.0–0.2)

## 2020-04-28 MED ORDER — SODIUM CHLORIDE 0.9 % IV SOLN
200.0000 mg | Freq: Once | INTRAVENOUS | Status: AC
  Administered 2020-04-28: 200 mg via INTRAVENOUS
  Filled 2020-04-28: qty 8

## 2020-04-28 MED ORDER — SODIUM CHLORIDE 0.9 % IV SOLN
10.0000 mg | Freq: Once | INTRAVENOUS | Status: AC
  Administered 2020-04-28: 10 mg via INTRAVENOUS
  Filled 2020-04-28: qty 10

## 2020-04-28 MED ORDER — SODIUM CHLORIDE 0.9% FLUSH
10.0000 mL | INTRAVENOUS | Status: DC | PRN
  Administered 2020-04-28: 10 mL
  Filled 2020-04-28: qty 10

## 2020-04-28 MED ORDER — SODIUM CHLORIDE 0.9% FLUSH
10.0000 mL | Freq: Once | INTRAVENOUS | Status: AC | PRN
  Administered 2020-04-28: 10 mL
  Filled 2020-04-28: qty 10

## 2020-04-28 MED ORDER — HEPARIN SOD (PORK) LOCK FLUSH 100 UNIT/ML IV SOLN
500.0000 [IU] | Freq: Once | INTRAVENOUS | Status: DC | PRN
  Filled 2020-04-28: qty 5

## 2020-04-28 MED ORDER — ZOLEDRONIC ACID 4 MG/100ML IV SOLN
INTRAVENOUS | Status: AC
  Filled 2020-04-28: qty 100

## 2020-04-28 MED ORDER — HEPARIN SOD (PORK) LOCK FLUSH 100 UNIT/ML IV SOLN
500.0000 [IU] | Freq: Once | INTRAVENOUS | Status: AC | PRN
  Administered 2020-04-28: 500 [IU]
  Filled 2020-04-28: qty 5

## 2020-04-28 MED ORDER — SODIUM CHLORIDE 0.9 % IV SOLN
513.0000 mg | Freq: Once | INTRAVENOUS | Status: AC
  Administered 2020-04-28: 510 mg via INTRAVENOUS
  Filled 2020-04-28: qty 51

## 2020-04-28 MED ORDER — PALONOSETRON HCL INJECTION 0.25 MG/5ML
INTRAVENOUS | Status: AC
  Filled 2020-04-28: qty 5

## 2020-04-28 MED ORDER — PALONOSETRON HCL INJECTION 0.25 MG/5ML
0.2500 mg | Freq: Once | INTRAVENOUS | Status: AC
  Administered 2020-04-28: 0.25 mg via INTRAVENOUS

## 2020-04-28 MED ORDER — ZOLEDRONIC ACID 4 MG/100ML IV SOLN
4.0000 mg | Freq: Once | INTRAVENOUS | Status: AC
  Administered 2020-04-28: 4 mg via INTRAVENOUS

## 2020-04-28 MED ORDER — SODIUM CHLORIDE 0.9 % IV SOLN
150.0000 mg | Freq: Once | INTRAVENOUS | Status: AC
  Administered 2020-04-28: 150 mg via INTRAVENOUS
  Filled 2020-04-28: qty 150

## 2020-04-28 MED ORDER — SODIUM CHLORIDE 0.9 % IV SOLN
500.0000 mg/m2 | Freq: Once | INTRAVENOUS | Status: AC
  Administered 2020-04-28: 900 mg via INTRAVENOUS
  Filled 2020-04-28: qty 20

## 2020-04-28 MED ORDER — SODIUM CHLORIDE 0.9 % IV SOLN
Freq: Once | INTRAVENOUS | Status: AC
  Filled 2020-04-28: qty 250

## 2020-04-28 MED FILL — ELIQUIS 5 MG TABLET: 5 | 30 days supply | Qty: 60 | Fill #0

## 2020-04-28 MED FILL — XTAMPZA ER 13.5 MG C12A: 13.5 | 30 days supply | Qty: 60 | Fill #0

## 2020-04-28 NOTE — Patient Instructions (Addendum)
[facility] Discharge Instructions for Patients Receiving Chemotherapy  Today you received the following chemotherapy agents: Keytruda/Alimta/Carboplatin.  To help prevent nausea and vomiting after your treatment, we encourage you to take your nausea medication as directed.   If you develop nausea and vomiting that is not controlled by your nausea medication, call the clinic.   BELOW ARE SYMPTOMS THAT SHOULD BE REPORTED IMMEDIATELY:  *FEVER GREATER THAN 100.5 F  *CHILLS WITH OR WITHOUT FEVER  NAUSEA AND VOMITING THAT IS NOT CONTROLLED WITH YOUR NAUSEA MEDICATION  *UNUSUAL SHORTNESS OF BREATH  *UNUSUAL BRUISING OR BLEEDING  TENDERNESS IN MOUTH AND THROAT WITH OR WITHOUT PRESENCE OF ULCERS  *URINARY PROBLEMS  *BOWEL PROBLEMS  UNUSUAL RASH Items with * indicate a potential emergency and should be followed up as soon as possible.  Feel free to call the clinic should you have any questions or concerns. The clinic phone number is [phone].  Please show the [place] at check-in to the Emergency Department and triage nurse.  Zoledronic Acid injection (Hypercalcemia, Oncology) What is this medicine? ZOLEDRONIC ACID (ZOE le dron ik AS id) lowers the amount of calcium loss from bone. It is used to treat too much calcium in your blood from cancer. It is also used to prevent complications of cancer that has spread to the bone. This medicine may be used for other purposes; ask your health care provider or pharmacist if you have questions. COMMON BRAND NAME(S): Zometa What should I tell my health care provider before I take this medicine? They need to know if you have any of these conditions:  aspirin-sensitive asthma  cancer, especially if you are receiving medicines used to treat cancer  dental disease or wear dentures  infection  kidney disease  receiving corticosteroids like dexamethasone or prednisone  an unusual or allergic reaction to  zoledronic acid, other medicines, foods, dyes, or preservatives  pregnant or trying to get pregnant  breast-feeding How should I use this medicine? This medicine is for infusion into a vein. It is given by a health care professional in a hospital or clinic setting. Talk to your pediatrician regarding the use of this medicine in children. Special care may be needed. Overdosage: If you think you have taken too much of this medicine contact a poison control center or emergency room at once. NOTE: This medicine is only for you. Do not share this medicine with others. What if I miss a dose? It is important not to miss your dose. Call your doctor or health care professional if you are unable to keep an appointment. What may interact with this medicine?  certain antibiotics given by injection  NSAIDs, medicines for pain and inflammation, like ibuprofen or naproxen  some diuretics like bumetanide, furosemide  teriparatide  thalidomide This list may not describe all possible interactions. Give your health care provider a list of all the medicines, herbs, non-prescription drugs, or dietary supplements you use. Also tell them if you smoke, drink alcohol, or use illegal drugs. Some items may interact with your medicine. What should I watch for while using this medicine? Visit your doctor or health care professional for regular checkups. It may be some time before you see the benefit from this medicine. Do not stop taking your medicine unless your doctor tells you to. Your doctor may order blood tests or other tests to see how you are doing. Women should inform their doctor if they wish to become pregnant or think they might be pregnant.  There is a potential for serious side effects to an unborn child. Talk to your health care professional or pharmacist for more information. You should make sure that you get enough calcium and vitamin D while you are taking this medicine. Discuss the foods you eat and  the vitamins you take with your health care professional. Some people who take this medicine have severe bone, joint, and/or muscle pain. This medicine may also increase your risk for jaw problems or a broken thigh bone. Tell your doctor right away if you have severe pain in your jaw, bones, joints, or muscles. Tell your doctor if you have any pain that does not go away or that gets worse. Tell your dentist and dental surgeon that you are taking this medicine. You should not have major dental surgery while on this medicine. See your dentist to have a dental exam and fix any dental problems before starting this medicine. Take good care of your teeth while on this medicine. Make sure you see your dentist for regular follow-up appointments. What side effects may I notice from receiving this medicine? Side effects that you should report to your doctor or health care professional as soon as possible:  allergic reactions like skin rash, itching or hives, swelling of the face, lips, or tongue  anxiety, confusion, or depression  breathing problems  changes in vision  eye pain  feeling faint or lightheaded, falls  jaw pain, especially after dental work  mouth sores  muscle cramps, stiffness, or weakness  redness, blistering, peeling or loosening of the skin, including inside the mouth  trouble passing urine or change in the amount of urine Side effects that usually do not require medical attention (report to your doctor or health care professional if they continue or are bothersome):  bone, joint, or muscle pain  constipation  diarrhea  fever  hair loss  irritation at site where injected  loss of appetite  nausea, vomiting  stomach upset  trouble sleeping  trouble swallowing  weak or tired This list may not describe all possible side effects. Call your doctor for medical advice about side effects. You may report side effects to FDA at 1-800-FDA-1088. Where should I keep my  medicine? This drug is given in a hospital or clinic and will not be stored at home. NOTE: This sheet is a summary. It may not cover all possible information. If you have questions about this medicine, talk to your doctor, pharmacist, or health care provider.  2020 Elsevier/Gold Standard (2014-01-23 14:19:39)

## 2020-04-28 NOTE — Patient Instructions (Signed)

## 2020-04-29 ENCOUNTER — Other Ambulatory Visit: Payer: Self-pay

## 2020-04-29 ENCOUNTER — Ambulatory Visit
Admission: RE | Admit: 2020-04-29 | Discharge: 2020-04-29 | Disposition: A | Discharge status: Home / Self Care | Payer: BC Managed Care – PPO | Source: Ambulatory Visit | Attending: Radiation Oncology | Admitting: Radiation Oncology

## 2020-04-29 ENCOUNTER — Telehealth: Payer: Self-pay | Admitting: *Deleted

## 2020-04-29 DIAGNOSIS — G9389 Other specified disorders of brain: Secondary | ICD-10-CM | POA: Diagnosis not present

## 2020-04-29 DIAGNOSIS — I6201 Nontraumatic acute subdural hemorrhage: Secondary | ICD-10-CM | POA: Diagnosis not present

## 2020-04-29 DIAGNOSIS — C7931 Secondary malignant neoplasm of brain: Secondary | ICD-10-CM

## 2020-04-29 DIAGNOSIS — S065X0A Traumatic subdural hemorrhage without loss of consciousness, initial encounter: Secondary | ICD-10-CM | POA: Diagnosis not present

## 2020-04-29 IMAGING — MR MR HEAD WO/W CM
11 series · 48 of 48 positions shown · IV contrast (12ml Multihance)
Comparison: [DATE]

CLINICAL DATA: Follow-up metastatic lung cancer status post SRS.

EXAM:
MRI HEAD WITHOUT AND WITH CONTRAST
TECHNIQUE: Multiplanar, multiecho pulse sequences of the brain and surrounding
structures were obtained without and with intravenous contrast.
CONTRAST:  15mL MULTIHANCE GADOBENATE DIMEGLUMINE 529 MG/ML IV SOLN

[Series 2: FLAIR · sagittal · 3.0mm · 0.75mm/px · 1 of 39 slices shown (1 of 2)]
[im 1/39]
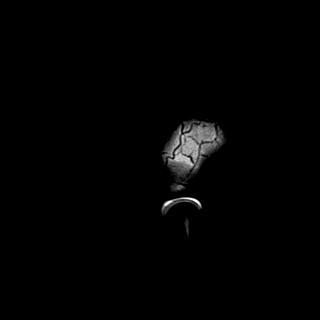

[Series 3: DWI · axial · 3.0mm · 1.50mm/px · z∈[-100,+79]mm · 5 of 94 slices shown (1 of 2)]
[im 1/94]
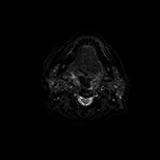
[im 24/94]
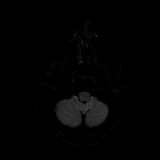
[im 47/94]
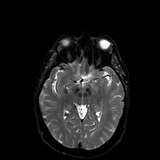
[im 70/94]
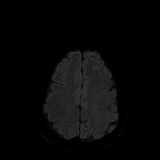
[im 94/94]
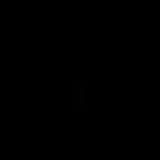

[Series 4: DWI · axial · 3.0mm · 1.50mm/px · z∈[-100,+79]mm · 3 of 46 slices shown (2 of 2)]
[im 1/46]
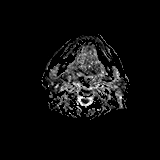
[im 23/46]
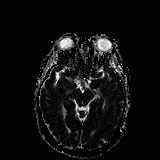
[im 46/46]
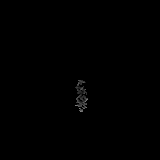

[Series 5: T2 · axial · 5.0mm · 0.57mm/px · z∈[-84,+90]mm · 2 of 30 slices shown]
[im 1/30]
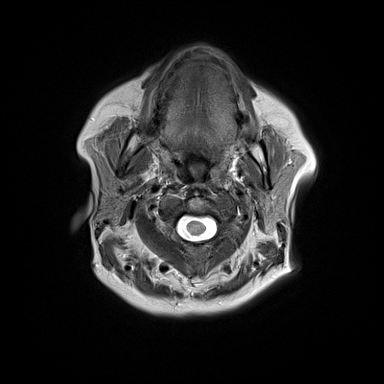
[im 30/30]
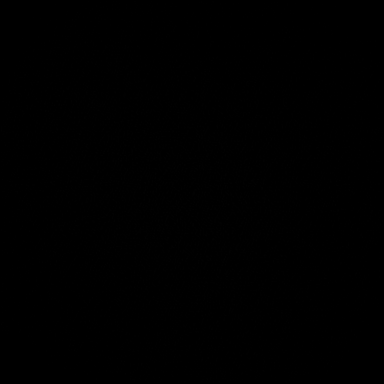

[Series 7: swi_images · axial · 1.5mm · 0.90mm/px · z∈[-83,+82]mm · 6 of 112 slices shown]
[im 1/112]
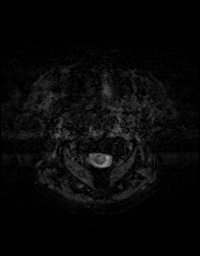
[im 23/112]
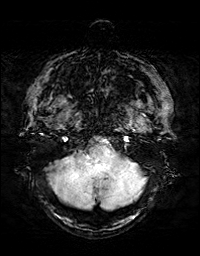
[im 45/112]
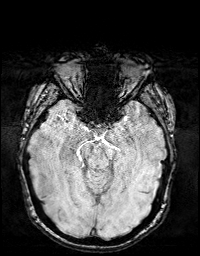
[im 67/112]
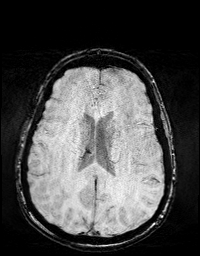
[im 89/112]
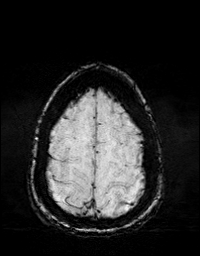
[im 112/112]
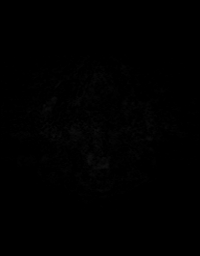

[Series 8: FLAIR · axial · 3.0mm · 0.86mm/px · z∈[-69,+96]mm · 3 of 56 slices shown (2 of 2)]
[im 1/56]
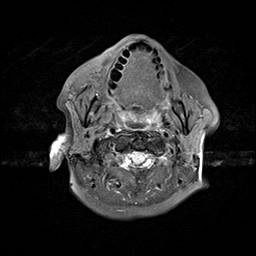
[im 28/56]
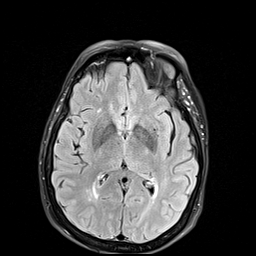
[im 56/56]
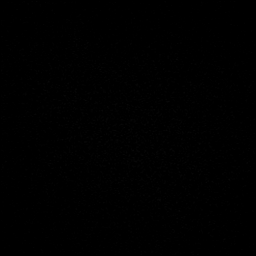

[Series 9: T1 · axial · 1.0mm · 0.75mm/px · z∈[-75,+100]mm · 10 of 174 slices shown]
[im 1/174]
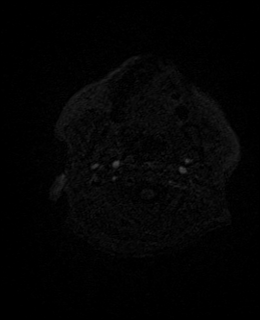
[im 20/174]
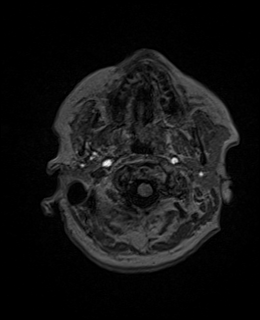
[im 39/174]
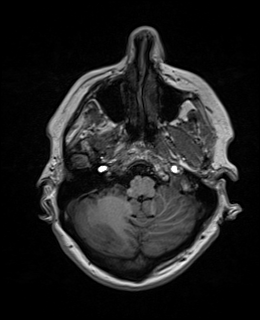
[im 58/174]
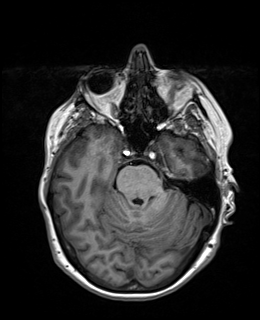
[im 77/174]
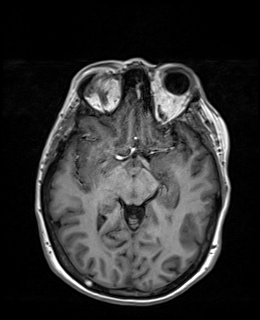
[im 97/174]
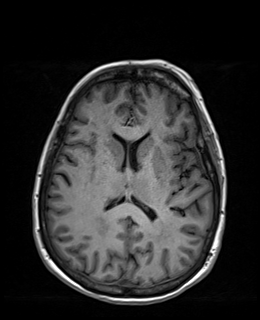
[im 116/174]
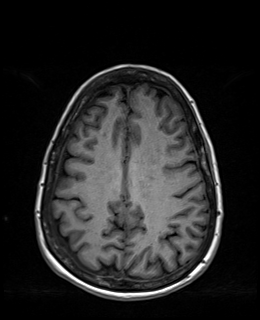
[im 135/174]
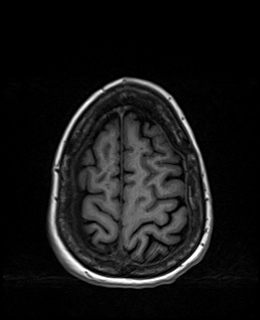
[im 154/174]
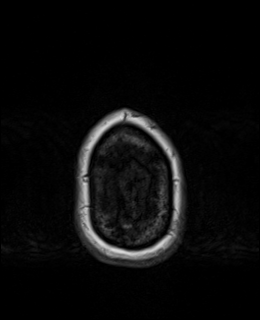
[im 174/174]
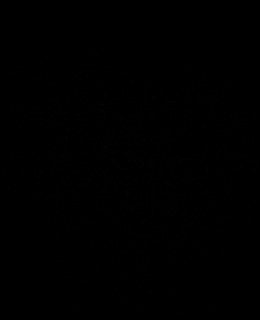

[Series 10: T2 post-contrast · coronal · 3.0mm · 0.86mm/px · 3 of 47 slices shown]
[im 1/47]
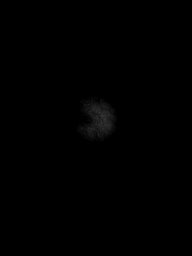
[im 24/47]
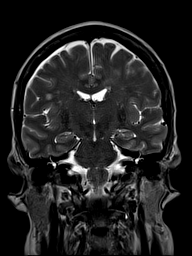
[im 47/47]
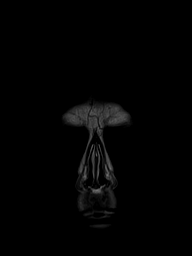

[Series 11: T1 post-contrast · axial · 1.0mm · 0.75mm/px · z∈[-75,+97]mm · 10 of 173 slices shown (1 of 2)]
[im 1/173]
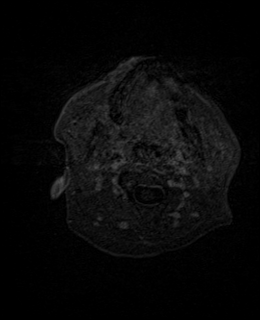
[im 20/173]
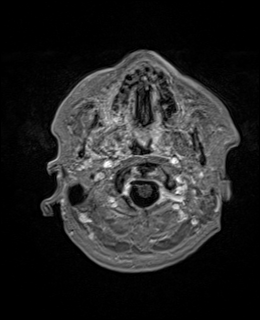
[im 39/173]
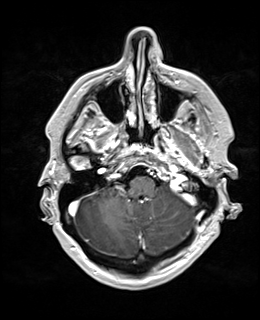
[im 58/173]
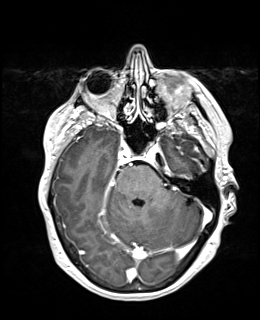
[im 77/173]
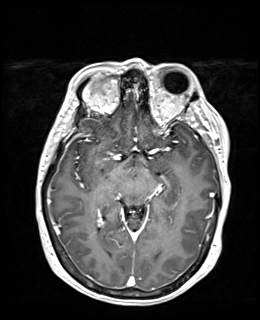
[im 96/173]
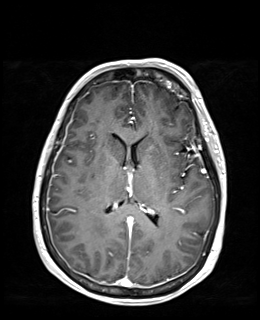
[im 115/173]
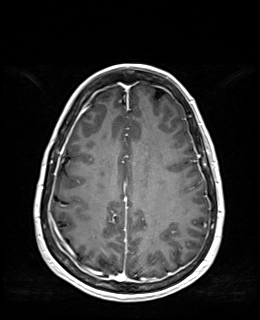
[im 134/173]
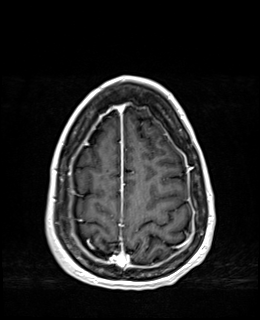
[im 153/173]
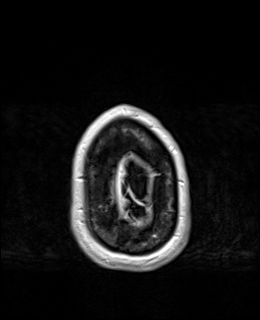
[im 173/173]
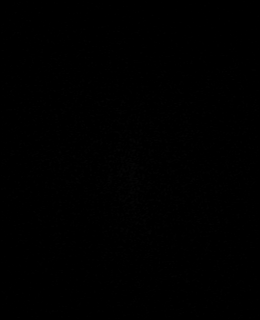

[Series 12: T1 post-contrast · coronal · 3.0mm · 0.57mm/px · 3 of 47 slices shown (2 of 2)]
[im 1/47]
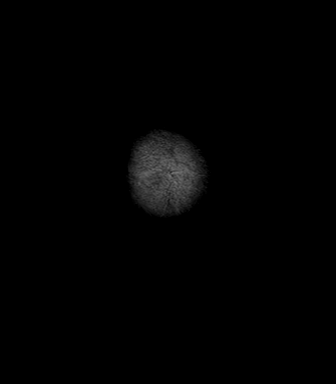
[im 24/47]
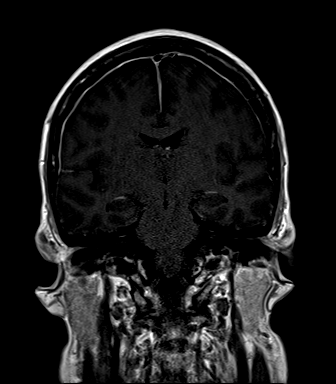
[im 47/47]
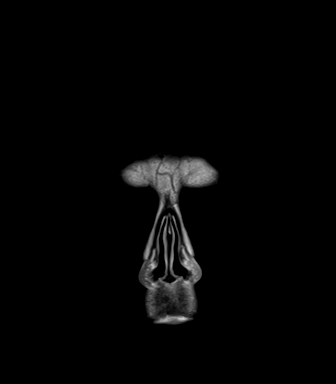

[Series 13: FLAIR post-contrast · sagittal · 3.0mm · 0.75mm/px · 2 of 39 slices shown]
[im 1/39]
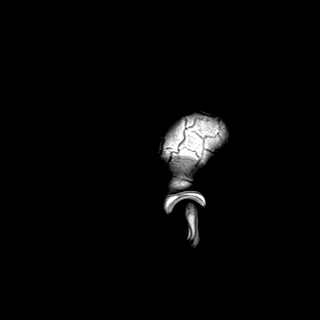
[im 39/39]
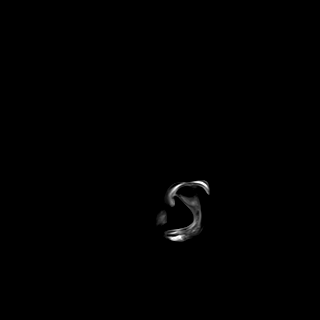

[48 of 48 positions shown; findings below may reference images not displayed]

FINDINGS: Brain: There are new small T1 hypointense, T2/FLAIR hyperintense
fluid collections over both cerebral convexities measuring up to 3
mm in thickness on the right and 2 mm on the left with associated
mild smooth dural thickening and enhancement diffusely without
nodularity. Only minimal susceptibility artifact is questioned
within the fluid collections. No acute infarct or midline shift is
present. The ventricles are normal in size. Patchy T2
hyperintensities in the cerebral white matter bilaterally are
unchanged and nonspecific but compatible with moderate chronic small
vessel ischemic disease.

A 10 mm ring-enhancing lesion in the left cerebellum is unchanged
with persistent mild surrounding edema (series 11, image 50). Two
adjacent punctate enhancing lesions in the right cerebellum are
smaller (series 11, image 48). A 4 mm enhancing lesion in the high
posterior left frontal lobe is unchanged (series 11, image 130). A 2
mm enhancing right occipital lesion is smaller (series 11, image
75). There are small volume chronic blood products associated with
most of these lesions. No new enhancing brain lesions are
identified.

Vascular: Major intracranial vascular flow voids are preserved.

Skull and upper cervical spine: No suspicious marrow lesion.
Unchanged intrinsically T1 hyperintense right occipital marrow focus
which may reflect a hemangioma.

Sinuses/Orbits: Unremarkable orbits. Mild mucosal thickening/small
mucous retention cysts in the left maxillary sinus. Clear mastoid
air cells.

Other: None.
IMPRESSION: 1. New small subdural hematomas or effusions over both cerebral
convexities without mass effect. Associated smooth dural enhancement
is likely reactive without nodularity to strongly suggest dural
metastatic disease.
2. Stable to decreased size of treated brain metastases.
3. No evidence of new brain metastases.
4. Moderate chronic small vessel ischemic disease.

These results will be called to the ordering clinician or
representative by the Radiologist Assistant, and communication
documented in the PACS or [REDACTED].

## 2020-04-29 MED ORDER — GADOBENATE DIMEGLUMINE 529 MG/ML IV SOLN
15.0000 mL | Freq: Once | INTRAVENOUS | Status: AC | PRN
  Administered 2020-04-29: 15 mL via INTRAVENOUS

## 2020-04-29 NOTE — Telephone Encounter (Signed)
Patient called back and asked for a refill of her long acting pain med Xtampza 13.5 mg  Message sent to Dr. Lorenso Courier.

## 2020-04-29 NOTE — Telephone Encounter (Signed)
Received call from patient. She states she is having some new swelling in her feet and ankles. She hasn't had this in awhile-since she stopped the [place]. She is asking if it is ok to take her lasix 20 mg x 3 days to see if it helps the swelling. Reviewed labs. Kidnet function is good.  Advised that she can, but encouraged her to stay well hydrated and to increase dietary K+. She voiced understanding. Advised to call back next week if she does not see any improvement with diuretic.  She states that she will.

## 2020-05-02 ENCOUNTER — Inpatient Hospital Stay: Payer: BC Managed Care – PPO

## 2020-05-02 ENCOUNTER — Telehealth: Payer: Self-pay | Admitting: *Deleted

## 2020-05-02 MED FILL — OLANZapine 2.5 MG TABS: 2.5 | 14 days supply | Qty: 14 | Fill #0

## 2020-05-02 NOTE — Telephone Encounter (Signed)
TCT [name] (pt's husband) regarding recent brain MRI.Marland Kitchen Per Dr. Mickeal Skinner and Dr. Sharol Roussel has 2 small subdural hematomas that are new. They are small. They are asking for pt to stop her eliquis at this time.. Relayed the above information to pt's husband, [name]. He voiced understanding. Advised to watch for any new swelling in legs as pt has had DVT in past

## 2020-05-03 ENCOUNTER — Ambulatory Visit
Admission: RE | Admit: 2020-05-03 | Discharge: 2020-05-03 | Disposition: A | Discharge status: Home / Self Care | Payer: BC Managed Care – PPO | Source: Ambulatory Visit | Attending: Radiation Oncology | Admitting: Radiation Oncology

## 2020-05-03 DIAGNOSIS — C7951 Secondary malignant neoplasm of bone: Secondary | ICD-10-CM

## 2020-05-03 DIAGNOSIS — S066X0A Traumatic subarachnoid hemorrhage without loss of consciousness, initial encounter: Secondary | ICD-10-CM | POA: Diagnosis not present

## 2020-05-03 DIAGNOSIS — C7931 Secondary malignant neoplasm of brain: Secondary | ICD-10-CM

## 2020-05-03 DIAGNOSIS — Z08 Encounter for follow-up examination after completed treatment for malignant neoplasm: Secondary | ICD-10-CM | POA: Diagnosis not present

## 2020-05-03 NOTE — Progress Notes (Signed)
Radiation Oncology         [phone] ________________________________  Name: Virginia [name] MRN: [id number]  Date: 05/03/2020  DOB: 14-Jul-1956  Follow-Up Visit Note by MyChart video the patient opted for telemedicine to maximize safety during the pandemic   CC: Elby Showers, MD  Ventura Sellers, MD  Diagnosis and Prior Radiotherapy:    ICD-10-CM   1. Bone metastases (HCC)  C79.51   2. Brain metastases [facility])  C79.31     Radiation Treatment Dates: 10/28/2019 through 11/11/2019  Site Technique Total Dose (Gy) Dose per Fx (Gy) Completed Fx Beam Energies  Ilium, Right: Pelvis_Rt 3D 20/20 4 5/5 10X, 15X  Brain: Brain_SRS IMRT 20/20 20 1/1 6XFFF  Thoracic Spine: Spine_T 3D 30/30 3 10/10 10X, 15X    CHIEF COMPLAINT: Here for follow-up and surveillance of metastastic cancer  Narrative:      Ms. Virginia [name] presents for follow-up today.  Her MRI of the brain was discussed at tumor board.  She has small subdural hematomas.  She has had at least one recent fall.  Medical oncology has stopped her Eliquis since the results became available.  She reports recent headaches, nausea, and dizziness.  She is receiving systemic therapy but had not experienced these neurologic symptoms after the initial cycle.  She is wondering whether the symptoms are due to her subdural hematomas.  Fortunately the MRI does not show any new or progressive metastatic disease in the brain.  She reports that her back pain is very well controlled after recovering from kyphoplasty.  No new bone pain elsewhere.  ALLERGIES:  has No Known Allergies.  Meds: Current Outpatient Medications  Medication Sig Dispense Refill  . albuterol (VENTOLIN HFA) 108 (90 Base) MCG/ACT inhaler Inhale 2 puffs into the lungs every 6 (six) hours as needed for wheezing or shortness of breath. 6.7 g 11  . ALPRAZolam (XANAX) 0.25 MG tablet Take 1 tablet (0.25 mg total) by mouth 2 (two) times daily as needed. for anxiety (Patient taking differently:  Take 0.25 mg by mouth 2 (two) times daily as needed for anxiety. ) 60 tablet 5  . amoxicillin-clavulanate (AUGMENTIN) 500-125 MG tablet Take by mouth.    Marland Kitchen apixaban (ELIQUIS) 5 MG TABS tablet Take 1 tablet (5 mg total) by mouth 2 (two) times daily. 60 tablet 1  . Cyanocobalamin (VITAMIN B12 PO) Take 2 tablets by mouth daily. Gummies    . folic acid (FOLVITE) 1 MG tablet Take 1 tablet (1 mg total) by mouth daily. 90 tablet 1  . furosemide (LASIX) 20 MG tablet 20 mg once daily for 3 days, then stop. May repeat if edema recurs. (Patient taking differently: Take 20 mg by mouth See admin instructions. 20 mg once daily for 3 days, then stop. Daily as needed  May repeat if edema recurs.) 30 tablet 1  . glycerin adult 2 g suppository Place 1 suppository rectally as needed for constipation. 12 suppository 0  . lidocaine-prilocaine (EMLA) cream Apply 1 application topically as needed. Apply to port as needed. (Patient taking differently: Apply 1 application topically as needed (Apply to port as needed.). ) 30 g 0  . methocarbamol (ROBAXIN) 500 MG tablet Take 500 mg by mouth 3 (three) times daily as needed.    . Multiple Vitamins-Minerals (AIRBORNE PO) Take 1 tablet by mouth daily.     Marland Kitchen ofloxacin (OCUFLOX) 0.3 % ophthalmic solution 2 drops each eye 4 times a day for 5 days (Patient not taking: Reported on 03/09/2020) 5  mL 1  . OLANZapine (ZYPREXA) 2.5 MG tablet TAKE 1 TABLET BY MOUTH AT BEDTIME 14 tablet 1  . oxyCODONE (OXY IR/ROXICODONE) 5 MG immediate release tablet TAKE 1 TO 2 TABLETS BY MOUTH EVERY 4 HOURS AS NEEDED FOR SEVERE OR BREAKTHROUGH PAIN 90 tablet 0  . pantoprazole (PROTONIX) 40 MG tablet TAKE 1 TABLET(40 MG) BY MOUTH DAILY (Patient taking differently: Take 40 mg by mouth daily. ) 90 tablet 3  . polyethylene glycol (MIRALAX / GLYCOLAX) 17 g packet Take 17 g by mouth 2 (two) times daily.     . prochlorperazine (COMPAZINE) 10 MG tablet TAKE 1 TABLET BY MOUTH EVERY 6 HOURS AS NEEDED FOR NAUSEA OR  VOMITING (Patient not taking: Reported on 02/25/2020) 30 tablet 0  . sennosides-docusate sodium (SENOKOT-S) 8.6-50 MG tablet Take 2-3 tablets by mouth See admin instructions. 2 in the morning and 3 at night. Adjust as needed    . sucralfate (CARAFATE) 1 g tablet Dissolve 1 tablet in 10 mL H20 and swallow 30 min prior to meals and bedtime. (Patient not taking: Reported on 02/25/2020) 50 tablet 3  . Vitamin D3 (VITAMIN D) 25 MCG tablet Take 2,000 Units by mouth daily.    Marland Kitchen XTAMPZA ER 13.5 MG C12A Take 1 capsule by mouth every 12 (twelve) hours.     No current facility-administered medications for this encounter.    Physical Findings: The patient is in no acute distress. Patient is alert and oriented.  vitals were not taken for this visit. .       Lab Findings: Lab Results  Component Value Date   WBC 4.5 04/28/2020   HGB 10.7 (L) 04/28/2020   HCT 31.2 (L) 04/28/2020   MCV 92.0 04/28/2020   PLT 331 04/28/2020    Radiographic Findings: MR Brain W Wo Contrast  Result Date: 04/29/2020 CLINICAL DATA:  Follow-up metastatic lung cancer status post SRS. EXAM: MRI HEAD WITHOUT AND WITH CONTRAST TECHNIQUE: Multiplanar, multiecho pulse sequences of the brain and surrounding structures were obtained without and with intravenous contrast. CONTRAST:  38mL MULTIHANCE GADOBENATE DIMEGLUMINE 529 MG/ML IV SOLN COMPARISON:  02/10/2020 FINDINGS: Brain: There are new small T1 hypointense, T2/FLAIR hyperintense fluid collections over both cerebral convexities measuring up to 3 mm in thickness on the right and 2 mm on the left with associated mild smooth dural thickening and enhancement diffusely without nodularity. Only minimal susceptibility artifact is questioned within the fluid collections. No acute infarct or midline shift is present. The ventricles are normal in size. Patchy T2 hyperintensities in the cerebral white matter bilaterally are unchanged and nonspecific but compatible with moderate chronic small  vessel ischemic disease. A 10 mm ring-enhancing lesion in the left cerebellum is unchanged with persistent mild surrounding edema (series 11, image 50). Two adjacent punctate enhancing lesions in the right cerebellum are smaller (series 11, image 48). A 4 mm enhancing lesion in the high posterior left frontal lobe is unchanged (series 11, image 130). A 2 mm enhancing right occipital lesion is smaller (series 11, image 75). There are small volume chronic blood products associated with most of these lesions. No new enhancing brain lesions are identified. Vascular: Major intracranial vascular flow voids are preserved. Skull and upper cervical spine: No suspicious marrow lesion. Unchanged intrinsically T1 hyperintense right occipital marrow focus which may reflect a hemangioma. Sinuses/Orbits: Unremarkable orbits. Mild mucosal thickening/small mucous retention cysts in the left maxillary sinus. Clear mastoid air cells. Other: None. IMPRESSION: 1. New small subdural hematomas or effusions over both cerebral  convexities without mass effect. Associated smooth dural enhancement is likely reactive without nodularity to strongly suggest dural metastatic disease. 2. Stable to decreased size of treated brain metastases. 3. No evidence of new brain metastases. 4. Moderate chronic small vessel ischemic disease. These results will be called to the ordering clinician or representative by the Radiologist Assistant, and communication documented in the PACS or [employer]. Electronically Signed   By: Logan Bores M.D.   On: 04/29/2020 17:05    Impression/Plan:    This is a very nice 64 year old woman with previous history of radiotherapy for bone mets and brain metastases.  I have personally reviewed her imaging.  Her bone pain is well controlled and she has recovered well from kyphoplasty.  She has no evidence of progressive or recurrent disease in the brain from an oncologic standpoint.  However she did have subdural  hematomas which I believe are contributing to her neurologic symptoms.  I told her that it will probably take a while for these symptoms to resolve as her brain heals.  She is holding Eliquis at the instruction of medical oncology and will look out for signs of DVTs.  She will continue systemic therapy.  We will arrange a repeat MRI in 3 months and follow-up with neurosurgery at that time.  This can certainly be done sooner if symptoms warrant.  Neurosurgery and I will alternate follow-ups every 3 months and continue to monitor her brain closely.  She and her husband are pleased with this plan.  On date of service, in total, I spent 20 minutes on this encounter.   This encounter was provided by telemedicine platform MyChart video. The patient has given verbal consent for this type of encounter and has been advised to only accept a meeting of this type in a secure network environment. The attendants for this meeting include [name]  and SANTANA GOSDIN.  During the encounter, [name] was located at [facility].  Dorena Bodo Meras was located at home.  _____________________________________   [name], MD

## 2020-05-06 ENCOUNTER — Encounter: Payer: Self-pay | Admitting: Radiation Oncology

## 2020-05-18 MED FILL — OLANZapine 2.5 MG TABS: 2.5 | 14 days supply | Qty: 14 | Fill #1

## 2020-05-19 ENCOUNTER — Other Ambulatory Visit: Payer: BC Managed Care – PPO

## 2020-05-20 ENCOUNTER — Inpatient Hospital Stay: Payer: BC Managed Care – PPO | Attending: Hematology and Oncology

## 2020-05-20 ENCOUNTER — Other Ambulatory Visit: Payer: Self-pay

## 2020-05-20 ENCOUNTER — Inpatient Hospital Stay: Payer: BC Managed Care – PPO | Admitting: Hematology and Oncology

## 2020-05-20 ENCOUNTER — Inpatient Hospital Stay: Payer: BC Managed Care – PPO

## 2020-05-20 ENCOUNTER — Other Ambulatory Visit: Payer: Self-pay | Admitting: Hematology and Oncology

## 2020-05-20 VITALS — BP 131/98 | HR 83 | Temp 97.0°F | Resp 18 | Ht 62.0 in | Wt 133.4 lb

## 2020-05-20 DIAGNOSIS — C3431 Malignant neoplasm of lower lobe, right bronchus or lung: Secondary | ICD-10-CM | POA: Diagnosis not present

## 2020-05-20 DIAGNOSIS — C3490 Malignant neoplasm of unspecified part of unspecified bronchus or lung: Secondary | ICD-10-CM

## 2020-05-20 DIAGNOSIS — Z95828 Presence of other vascular implants and grafts: Secondary | ICD-10-CM

## 2020-05-20 DIAGNOSIS — E876 Hypokalemia: Secondary | ICD-10-CM | POA: Insufficient documentation

## 2020-05-20 DIAGNOSIS — C7931 Secondary malignant neoplasm of brain: Secondary | ICD-10-CM | POA: Diagnosis not present

## 2020-05-20 DIAGNOSIS — Z23 Encounter for immunization: Secondary | ICD-10-CM | POA: Insufficient documentation

## 2020-05-20 DIAGNOSIS — Z79899 Other long term (current) drug therapy: Secondary | ICD-10-CM | POA: Diagnosis not present

## 2020-05-20 DIAGNOSIS — C778 Secondary and unspecified malignant neoplasm of lymph nodes of multiple regions: Secondary | ICD-10-CM | POA: Diagnosis not present

## 2020-05-20 DIAGNOSIS — Z5112 Encounter for antineoplastic immunotherapy: Secondary | ICD-10-CM | POA: Insufficient documentation

## 2020-05-20 DIAGNOSIS — C7951 Secondary malignant neoplasm of bone: Secondary | ICD-10-CM

## 2020-05-20 DIAGNOSIS — Z5111 Encounter for antineoplastic chemotherapy: Secondary | ICD-10-CM | POA: Insufficient documentation

## 2020-05-20 DIAGNOSIS — R918 Other nonspecific abnormal finding of lung field: Secondary | ICD-10-CM

## 2020-05-20 DIAGNOSIS — R112 Nausea with vomiting, unspecified: Secondary | ICD-10-CM | POA: Insufficient documentation

## 2020-05-20 DIAGNOSIS — Z86718 Personal history of other venous thrombosis and embolism: Secondary | ICD-10-CM | POA: Insufficient documentation

## 2020-05-20 DIAGNOSIS — R52 Pain, unspecified: Secondary | ICD-10-CM | POA: Diagnosis not present

## 2020-05-20 LAB — CBC WITH DIFFERENTIAL (CANCER CENTER ONLY)
Abs Immature Granulocytes: 0.02 10*3/uL (ref 0.00–0.07)
Basophils Absolute: 0 10*3/uL (ref 0.0–0.1)
Basophils Relative: 1 %
Eosinophils Absolute: 0.1 10*3/uL (ref 0.0–0.5)
Eosinophils Relative: 2 %
HCT: 31.7 % — ABNORMAL LOW (ref 36.0–46.0)
Hemoglobin: 10.8 g/dL — ABNORMAL LOW (ref 12.0–15.0)
Immature Granulocytes: 1 %
Lymphocytes Relative: 31 %
Lymphs Abs: 1.2 10*3/uL (ref 0.7–4.0)
MCH: 31.5 pg (ref 26.0–34.0)
MCHC: 34.1 g/dL (ref 30.0–36.0)
MCV: 92.4 fL (ref 80.0–100.0)
Monocytes Absolute: 0.5 10*3/uL (ref 0.1–1.0)
Monocytes Relative: 13 %
Neutro Abs: 2 10*3/uL (ref 1.7–7.7)
Neutrophils Relative %: 52 %
Platelet Count: 248 10*3/uL (ref 150–400)
RBC: 3.43 MIL/uL — ABNORMAL LOW (ref 3.87–5.11)
RDW: 14.3 % (ref 11.5–15.5)
WBC Count: 3.9 10*3/uL — ABNORMAL LOW (ref 4.0–10.5)
nRBC: 0 % (ref 0.0–0.2)

## 2020-05-20 LAB — CMP (CANCER CENTER ONLY)
ALT: 60 U/L — ABNORMAL HIGH (ref 0–44)
AST: 40 U/L (ref 15–41)
Albumin: 3.4 g/dL — ABNORMAL LOW (ref 3.5–5.0)
Alkaline Phosphatase: 93 U/L (ref 38–126)
Anion gap: 8 (ref 5–15)
BUN: 7 mg/dL — ABNORMAL LOW (ref 8–23)
CO2: 23 mmol/L (ref 22–32)
Calcium: 8.8 mg/dL — ABNORMAL LOW (ref 8.9–10.3)
Chloride: 110 mmol/L (ref 98–111)
Creatinine: 0.59 mg/dL (ref 0.44–1.00)
GFR, Est AFR Am: >60 mL/min (ref >60)
GFR, Estimated: >60 mL/min (ref >60)
Glucose, Bld: 101 mg/dL — ABNORMAL HIGH (ref 70–99)
Potassium: 3.9 mmol/L (ref 3.5–5.1)
Sodium: 141 mmol/L (ref 135–145)
Total Bilirubin: 0.3 mg/dL (ref 0.3–1.2)
Total Protein: 6.4 g/dL — ABNORMAL LOW (ref 6.5–8.1)

## 2020-05-20 LAB — MAGNESIUM: Magnesium: 1.7 mg/dL (ref 1.7–2.4)

## 2020-05-20 LAB — TSH: TSH: 0.984 u[IU]/mL (ref 0.308–3.960)

## 2020-05-20 MED ORDER — OLANZAPINE 2.5 MG PO TABS
2.5000 mg | ORAL_TABLET | Freq: Every day | ORAL | 1 refills | Status: DC
Start: 2020-05-20 — End: 2020-08-17

## 2020-05-20 MED ORDER — SODIUM CHLORIDE 0.9 % IV SOLN
Freq: Once | INTRAVENOUS | Status: AC
  Filled 2020-05-20: qty 250

## 2020-05-20 MED ORDER — HEPARIN SOD (PORK) LOCK FLUSH 100 UNIT/ML IV SOLN
500.0000 [IU] | Freq: Once | INTRAVENOUS | Status: AC | PRN
  Administered 2020-05-20: 500 [IU]
  Filled 2020-05-20: qty 5

## 2020-05-20 MED ORDER — PALONOSETRON HCL INJECTION 0.25 MG/5ML
0.2500 mg | Freq: Once | INTRAVENOUS | Status: AC
  Administered 2020-05-20: 0.25 mg via INTRAVENOUS

## 2020-05-20 MED ORDER — PALONOSETRON HCL INJECTION 0.25 MG/5ML
INTRAVENOUS | Status: AC
  Filled 2020-05-20: qty 5

## 2020-05-20 MED ORDER — SODIUM CHLORIDE 0.9 % IV SOLN
800.0000 mg | Freq: Once | INTRAVENOUS | Status: AC
  Administered 2020-05-20: 800 mg via INTRAVENOUS
  Filled 2020-05-20: qty 20

## 2020-05-20 MED ORDER — XTAMPZA ER 13.5 MG PO C12A
1.0000 | EXTENDED_RELEASE_CAPSULE | Freq: Two times a day (BID) | ORAL | 0 refills | Status: DC
Start: 2020-05-20 — End: 2020-06-20

## 2020-05-20 MED ORDER — DEXAMETHASONE SODIUM PHOSPHATE 100 MG/10ML IJ SOLN
10.0000 mg | Freq: Once | INTRAMUSCULAR | Status: AC
Start: 2020-05-20 — End: 2020-05-20
  Administered 2020-05-20: 10 mg via INTRAVENOUS
  Filled 2020-05-20: qty 10

## 2020-05-20 MED ORDER — SODIUM CHLORIDE 0.9 % IV SOLN
150.0000 mg | Freq: Once | INTRAVENOUS | Status: AC
  Administered 2020-05-20: 150 mg via INTRAVENOUS
  Filled 2020-05-20: qty 150

## 2020-05-20 MED ORDER — SODIUM CHLORIDE 0.9% FLUSH
10.0000 mL | INTRAVENOUS | Status: DC | PRN
  Administered 2020-05-20: 10 mL via INTRAVENOUS
  Filled 2020-05-20: qty 10

## 2020-05-20 MED ORDER — SODIUM CHLORIDE 0.9% FLUSH
10.0000 mL | INTRAVENOUS | Status: DC | PRN
  Administered 2020-05-20: 10 mL
  Filled 2020-05-20: qty 10

## 2020-05-20 MED ORDER — SODIUM CHLORIDE 0.9 % IV SOLN
200.0000 mg | Freq: Once | INTRAVENOUS | Status: AC
  Administered 2020-05-20: 200 mg via INTRAVENOUS
  Filled 2020-05-20: qty 8

## 2020-05-20 MED ORDER — SODIUM CHLORIDE 0.9 % IV SOLN
510.0000 mg | Freq: Once | INTRAVENOUS | Status: AC
  Administered 2020-05-20: 510 mg via INTRAVENOUS
  Filled 2020-05-20: qty 51

## 2020-05-20 NOTE — Progress Notes (Signed)
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 CT of the head showed showed concerning for metastatic disease involving the left cerebellum and right occipital lobe.  2) 09/30/2019: MRI brain confirms mass within the left cerebellum measures 1.6 x 1.3 x 1.5 cm as well as at least 8 metastatic lesions within the supratentorial and infratentorial brain as outlined 3) 10/01/2019: PET CT scan revealed hypermetabolic infrahilar right lower lobe mass with hypermetabolic nodal metastases in the right hilum, mediastinum, right supraclavicular region, left axilla and lower left neck. 4) 10/08/2019: US guided biopsy of the lymph nodes confirms poorly differentiated non-small cell carcinoma. Immunohistochemistry confirms adenocarcinoma of lung primary. PD-L1 and NGS later revealed a MET Exon 14 mutation and TPS of 90%.  5) 10/12/2019: Establish care with Dr. Lorenso Courier  6) 11/04/2019: Day 1 of capmatinib $RemoveBefor'400mg'[id number]$  BID 7) 11/09/2019: presented to Rad/Onc with a DVT in LLE. Seen in symptom management clinic and started on apixaban.  8) 12/29/2019: CT C/A/P showed interval decrease in size of mass involving the superior segment of right lower lobe and reduction in mediastinal and left supraclavicular adenopathy though some small spinal lesions were noted in the interim between the two sets of scans 9) 01/25/2020: temporarily stopped capmatinib due to symptoms of nausea/fatigue. Restarted therapy on 01/30/2020 after brief chemo holiday.  10) 03/22/2020: CT C/A/P reveals a mixed picture, with new lung nodule and  increased lymphadenopathy, however response in bone lesions. D/c capmatinib therapy 11) 04/08/2020: start of Carbo/Pem/Pem for 2nd line treatment. Cycle 1 Day 1   12) 04/29/2020: Cycle 2 Day 1 Carbo/Pem/Pem  13) 05/20/2020: Cycle 3 Day 1 Carbo/Pem/Pem   Interval History:  Virginia [name] 64 y.o. female with medical history significant for metastatic adenocarcinoma of the lung presents for a follow up visit. The patient's last visit was on 04/21/2020 prior to the start of Cycle 2 Day 1 of Carbo/Pem/Pem. In the interim since the last visit she has had no new symptoms.   On exam today Virginia [name] is that she feels well.  She reports that she tolerated chemotherapy great and that she had high levels of energy on day 2.  She had some sleepiness on day 3 through 5, however she rebounded to baseline after that.  She reports that she has had no nausea and is only been taking the Zyprexa nightly.  Her bowels have been moving routinely. She has continued to have some episodes of wheezing helped with the inhaler. Overall she endorses that her quality of life is much better.   She continues to have pain which is well controlled currently on her current pain regimen including the long-acting Xtampza scheduled and oxycodone PRN (she notes using this 1-2 times daily).   Her anticoagulant therapy has been discontinued as a result of concern for brain bleed on her last MRI. She has had no signs or symtpom of recurrent VTE since her last visit. Her legs have baseline level of swelling with no chest pain or shortness of breath.  Overall her health is stable as compared to her last visit.  On further discussing this he notes denies having any issues  with fevers, chills, sweats, diarrhea, or constipation.  She notes that her breathing has been good and she has not had any issues with cough, shortness of breath, or chest pain.  A full 10 point ROS is listed below.  MEDICAL HISTORY:  Past Medical History:  Diagnosis Date   . Anemia   . Anxiety   . Concussion 09/28/2019  . DVT (deep venous thrombosis) ([place]) 2021   L leg  . Dyspnea   . GERD (gastroesophageal reflux disease)   . Hypercholesterolemia    per pt, she does not have elevated lipids  . Hypertension   . met lung ca dx'd 09/2019   mets to spine, hip and brain  . PONV (postoperative nausea and vomiting)   . Tobacco abuse     SURGICAL HISTORY: Past Surgical History:  Procedure Laterality Date  . ABDOMINAL HYSTERECTOMY     partial/ left ovaries  . CHOLECYSTECTOMY    . DILATION AND CURETTAGE OF UTERUS    . IR IMAGING GUIDED PORT INSERTION  10/23/2019  . KYPHOPLASTY N/A 03/15/2020   Procedure: Thoracic Eight KYPHOPLASTY;  Surgeon: Erline Levine, MD;  Location: [place];  Service: Neurosurgery;  Laterality: N/A;  prone   . LIPOMA EXCISION  2018   removed under left breast and right thigh.  . TUBAL LIGATION      ALLERGIES:  has No Known Allergies.  MEDICATIONS:  Current Outpatient Medications  Medication Sig Dispense Refill  . albuterol (VENTOLIN HFA) 108 (90 Base) MCG/ACT inhaler Inhale 2 puffs into the lungs every 6 (six) hours as needed for wheezing or shortness of breath. 6.7 g 11  . ALPRAZolam (XANAX) 0.25 MG tablet Take 1 tablet (0.25 mg total) by mouth 2 (two) times daily as needed. for anxiety (Patient taking differently: Take 0.25 mg by mouth 2 (two) times daily as needed for anxiety. ) 60 tablet 5  . amoxicillin-clavulanate (AUGMENTIN) 500-125 MG tablet Take by mouth.    Marland Kitchen apixaban (ELIQUIS) 5 MG TABS tablet Take 1 tablet (5 mg total) by mouth 2 (two) times daily. 60 tablet 1  . Cyanocobalamin (VITAMIN B12 PO) Take 2 tablets by mouth daily. Gummies    . folic acid (FOLVITE) 1 MG tablet Take 1 tablet (1 mg total) by mouth daily. 90 tablet 1  . furosemide (LASIX) 20 MG tablet 20 mg once daily for 3 days, then stop. May repeat if edema recurs. (Patient taking differently: Take 20 mg by mouth See admin instructions. 20 mg once daily for 3  days, then stop. Daily as needed  May repeat if edema recurs.) 30 tablet 1  . glycerin adult 2 g suppository Place 1 suppository rectally as needed for constipation. 12 suppository 0  . lidocaine-prilocaine (EMLA) cream Apply 1 application topically as needed. Apply to port as needed. (Patient taking differently: Apply 1 application topically as needed (Apply to port as needed.). ) 30 g 0  . methocarbamol (ROBAXIN) 500 MG tablet Take 500 mg by mouth 3 (three) times daily as needed.    . Multiple Vitamins-Minerals (AIRBORNE PO) Take 1 tablet by mouth daily.     Marland Kitchen ofloxacin (OCUFLOX) 0.3 % ophthalmic solution 2 drops each eye 4 times a day for 5 days (Patient not taking: Reported on 03/09/2020) 5 mL 1  . OLANZapine (ZYPREXA) 2.5 MG tablet TAKE 1 TABLET BY MOUTH AT BEDTIME 14 tablet 1  . oxyCODONE (OXY IR/ROXICODONE) 5 MG immediate release tablet TAKE 1 TO 2 TABLETS BY MOUTH EVERY 4 HOURS AS NEEDED  FOR SEVERE OR BREAKTHROUGH PAIN 90 tablet 0  . pantoprazole (PROTONIX) 40 MG tablet TAKE 1 TABLET(40 MG) BY MOUTH DAILY (Patient taking differently: Take 40 mg by mouth daily. ) 90 tablet 3  . polyethylene glycol (MIRALAX / GLYCOLAX) 17 g packet Take 17 g by mouth 2 (two) times daily.     . prochlorperazine (COMPAZINE) 10 MG tablet TAKE 1 TABLET BY MOUTH EVERY 6 HOURS AS NEEDED FOR NAUSEA OR VOMITING (Patient not taking: Reported on 02/25/2020) 30 tablet 0  . sennosides-docusate sodium (SENOKOT-S) 8.6-50 MG tablet Take 2-3 tablets by mouth See admin instructions. 2 in the morning and 3 at night. Adjust as needed    . sucralfate (CARAFATE) 1 g tablet Dissolve 1 tablet in 10 mL H20 and swallow 30 min prior to meals and bedtime. (Patient not taking: Reported on 02/25/2020) 50 tablet 3  . Vitamin D3 (VITAMIN D) 25 MCG tablet Take 2,000 Units by mouth daily.    Marland Kitchen XTAMPZA ER 13.5 MG C12A Take 1 capsule by mouth every 12 (twelve) hours.     No current facility-administered medications for this visit.    REVIEW OF  SYSTEMS:   Constitutional: ( - ) fevers, ( - )  chills , ( - ) night sweats Eyes: ( - ) blurriness of vision, ( - ) double vision, ( - ) watery eyes Ears, nose, mouth, throat, and face: ( - ) mucositis, ( - ) sore throat Respiratory: ( - ) cough, ( - ) dyspnea, ( - ) wheezes Cardiovascular: ( - ) palpitation, ( - ) chest discomfort, ( - ) lower extremity swelling Gastrointestinal:  ( - ) nausea, ( - ) heartburn, ( - ) change in bowel habits Skin: ( - ) abnormal skin rashes Lymphatics: ( - ) new lymphadenopathy, ( - ) easy bruising Neurological: ( - ) numbness, ( - ) tingling, ( - ) new weaknesses Behavioral/Psych: ( - ) mood change, ( - ) new changes  All other systems were reviewed with the patient and are negative.  PHYSICAL EXAMINATION: ECOG PERFORMANCE STATUS: 1 - Symptomatic but completely ambulatory  There were no vitals filed for this visit. There were no vitals filed for this visit.  GENERAL: well appearing middle aged Caucasian female in NAD  SKIN: skin color, texture, turgor are normal, no rashes or significant lesions EYES: conjunctiva are pink and non-injected, sclera clear LUNGS: clear to auscultation and percussion with normal breathing effort HEART: regular rate & rhythm and no murmurs and bilateral + trace pitting lower extremity edema Musculoskeletal: no cyanosis of digits and no clubbing PSYCH: alert & oriented x 3, fluent speech NEURO: no focal motor/sensory deficits   LABORATORY DATA:  I have reviewed the data as listed CBC Latest Ref Rng & Units 04/28/2020 04/21/2020 04/15/2020  WBC 4.0 - 10.5 K/uL 4.5 6.1 3.1(L)  Hemoglobin 12.0 - 15.0 g/dL 10.7(L) 11.2(L) 10.5(L)  Hematocrit 36 - 46 % 31.2(L) 32.8(L) 30.6(L)  Platelets 150 - 400 K/uL 331 275 169    CMP Latest Ref Rng & Units 04/28/2020 04/21/2020 04/15/2020  Glucose 70 - 99 mg/dL 93 95 89  BUN 8 - 23 mg/dL _0 Creatinine 0.44 - 1.00 mg/dL 0.57 0.58 0.56  Sodium 135 - 145 mmol/L 140 138 139  Potassium  3.5 - 5.1 mmol/L 3.9 4.2 4.1  Chloride 98 - 111 mmol/L 108 106 108  CO2 22 - 32 mmol/L _1 Calcium 8.9 - 10.3 mg/dL 9.3 9.6 9.3  Total Protein 6.5 - 8.1 g/dL 6.1(L) 6.5 6.2(L)  Total Bilirubin 0.3 - 1.2 mg/dL <0.2(L) 0.2(L) 0.3  Alkaline Phos 38 - 126 U/L 80 92 84  AST 15 - 41 U/L 23 31 34  ALT 0 - 44 U/L 31 55(H) 61(H)     RADIOGRAPHIC STUDIES: I personally have viewed the radiographic studies below: CT C/A/P showed increased lymphadenopathy of supraclavicular node with new lung nodule. Bone lesions responding. Evidence of T8 kyphoplasty. MR Brain W Wo Contrast  Result Date: 04/29/2020 CLINICAL DATA:  Follow-up metastatic lung cancer status post SRS. EXAM: MRI HEAD WITHOUT AND WITH CONTRAST TECHNIQUE: Multiplanar, multiecho pulse sequences of the brain and surrounding structures were obtained without and with intravenous contrast. CONTRAST:  82mL MULTIHANCE GADOBENATE DIMEGLUMINE 529 MG/ML IV SOLN COMPARISON:  02/10/2020 FINDINGS: Brain: There are new small T1 hypointense, T2/FLAIR hyperintense fluid collections over both cerebral convexities measuring up to 3 mm in thickness on the right and 2 mm on the left with associated mild smooth dural thickening and enhancement diffusely without nodularity. Only minimal susceptibility artifact is questioned within the fluid collections. No acute infarct or midline shift is present. The ventricles are normal in size. Patchy T2 hyperintensities in the cerebral white matter bilaterally are unchanged and nonspecific but compatible with moderate chronic small vessel ischemic disease. A 10 mm ring-enhancing lesion in the left cerebellum is unchanged with persistent mild surrounding edema (series 11, image 50). Two adjacent punctate enhancing lesions in the right cerebellum are smaller (series 11, image 48). A 4 mm enhancing lesion in the high posterior left frontal lobe is unchanged (series 11, image 130). A 2 mm enhancing right occipital lesion is smaller  (series 11, image 75). There are small volume chronic blood products associated with most of these lesions. No new enhancing brain lesions are identified. Vascular: Major intracranial vascular flow voids are preserved. Skull and upper cervical spine: No suspicious marrow lesion. Unchanged intrinsically T1 hyperintense right occipital marrow focus which may reflect a hemangioma. Sinuses/Orbits: Unremarkable orbits. Mild mucosal thickening/small mucous retention cysts in the left maxillary sinus. Clear mastoid air cells. Other: None. IMPRESSION: 1. New small subdural hematomas or effusions over both cerebral convexities without mass effect. Associated smooth dural enhancement is likely reactive without nodularity to strongly suggest dural metastatic disease. 2. Stable to decreased size of treated brain metastases. 3. No evidence of new brain metastases. 4. Moderate chronic small vessel ischemic disease. These results will be called to the ordering clinician or representative by the Radiologist Assistant, and communication documented in the PACS or [employer]. Electronically Signed   By: Logan Bores M.D.   On: 04/29/2020 17:05    ASSESSMENT & PLAN OLGA BOURBEAU 64 y.o. female with medical history significant for metastatic adenocarcinoma of the lung presents for a follow up visit. 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   Today Virginia [name] reports she is tolerating treatment well with no clear side effects.  Her pain is currently well controlled with minimal PRN oxycodone. Eliquis was stopped due to concern for brain bleed, but no symptoms of recurrent VTE noted.  She has some mild wheezing noted on chest exam but is otherwise quite well.  Previously we discussed Carboplatin/Pembrolizumab/Pemetrexed and the expected side effect from these medications.  We discussed the possible side effects of immunotherapy including colitis, hepatitis, dermatitis, and  pneumonitis.  Additionally we discussed the side effects of carboplatin which include suppression of blood counts,  nephrotoxicity, and nausea.  Additionally we discussed pemetrexed which can also have effects on counts and would require supplementation with vitamin Z61 and folic acid.  She voiced her understanding of these side effects and treatment plans moving forward.  I also noted that we would be able to drop the chemotherapy agents that she had intolerance to continue with pembrolizumab alone given her high TPS score.  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   --patient will be followed every 3 weeks with labs and clinic visit. --port in place, patient completed chemotherapy education.  --plan for repeat CT C/A/P in Oct 2021 (3 months from last scan in July 2021) --after cycle 4 consider de-escalation to maintenance pem-pem.  --supportive therapy as listed below.  --RTC q 3 weeks for continued monitoring.    #Nausea/Vomiting -- provided patient with Zofran 4-67m q8h PRN and compazine 126mq6H for breakthrough PRN --continue olanzpaine 2.63m363mO QHS to help with nausea. Marked improvement since the addition of this medication on 01/29/2020.   --continue to monitor   #Hypokalemia, stable --likely 2/2 to decreased PO intake, hypovolemia, and BP medications --given her decreased BP would recommend holding losartan and hydrochlorothiazide --continue to monitor  #Pain Control, improving --continue oxycodone 63mg43mH PRN. Can increase dose to 10mg70m if pain is severe. She is taking approximately 1-2 pills per day.  --continue Xtampza for long acting support.  --continue to take with Senokot to prevent opioid induced constipation. Miralax PRN   #Supportive Therapy --continue EMLA cream with port --zometa 4g IV q 12 weeks for bone metastasis, dental  clearance received.  --nausea as above   #Headache, resolved --appreciate the assistance of Dr. VasloMickeal Skinnerreating her brain metastasis and symptoms. --radiation therapy to the brain completed on 11/16/2019.  --MRI brain on 04/29/2020 showed evidence of small brain bleed. Eliquis therapy was discontinued.  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  All questions were answered. The patient knows to call the clinic with any problems, questions or concerns.  A total of more than 30 minutes were spent on this encounter and over half of that time was spent on counseling and coordination of care as outlined above.   Yitta Gongaware Ledell PeoplesDepartment of Hematology/Oncology [facility] [place]: [phone]: [phone]: [email or web address]  05/20/2020 9:06 AM   Literature Support:  GandhLorenza Chickrguez-Abreu D, GadgeDuffy Rhodyip E, De AnKaanapaliomine M, ClingWoodinvillehmair MJ, PowelTownsendngPerryvillechoff HG, PeledPioneerssi F, JenneKaserk M, Hui RBowringonHopedaleerPierre Partio-Viqueira B, Novello S, Kurata T, Gray JE, Vida J, Wei Z, Yang J, Raftopoulos H, PietaCut OffasLarkeKWeyerhaeuser [id number] Investigators. Pembrolizumab plus Chemotherapy in Metastatic Non-Small-Cell Lung Cancer. N EngAlta Corning 2018 May 31;378(22):2078-2092.  --Median progression-free survival was 8.8 months (95% CI, 7.6 to 9.2) in the pembrolizumab-combination group and 4.9 months (95% CI, 4.7 to 5.5) in the placebo-combination group (hazard ratio for disease progression or death, 0.52; 95% CI, 0.43 to 0.64; P<0.001). Adverse events of grade 3 or higher occurred in 67.2% of the patients in the pembrolizumab-combination group and  in 65.8% of those in the  placebo-combination group.  Jodene Nam L. Efficacy of pemetrexed-based regimens in advanced non-small cell lung cancer patients with activating epidermal growth factor receptor mutations after tyrosine kinase inhibitor failure: a systematic review. Onco Targets Ther. 2018;11:2121-2129.   --The weighted median PFS, median OS, and ORR for patients treated with pem regimens were 5.09 months, 15.91 months, and 30.19%, respectively. Our systematic review results showed a favorable efficacy profile of pem regimens in NSCLC patients with EGFR mutation after EGFR-TKI failure.

## 2020-05-20 NOTE — Patient Instructions (Signed)

## 2020-05-23 ENCOUNTER — Telehealth: Payer: Self-pay | Admitting: *Deleted

## 2020-05-23 NOTE — Telephone Encounter (Signed)
Received call today from pt's husband, [name]. He states she has been feeling dizzy over the weekend, needing standby assist to the bathroom.. she is doing better this afternoon. Advised to make sure pt is staying  Well hydrated-64 oz per day and eating well. Pt has chemo on Friday, 05/20/20 and her labs were good that day. Advised to call back by Wednesday if dizziness persists.  [name] voiced understanding

## 2020-05-24 ENCOUNTER — Telehealth: Payer: Self-pay | Admitting: Hematology and Oncology

## 2020-05-24 NOTE — Telephone Encounter (Signed)
Called and spoke with patients husband to inform that for 9/30 appt, there would be a gap between the doc and infusion appts or patient has the option to decouple. Husband will speak with wife and call back to let me know what they decide

## 2020-05-25 ENCOUNTER — Telehealth: Payer: Self-pay | Admitting: *Deleted

## 2020-05-25 NOTE — Telephone Encounter (Signed)
Received call from pt's husband ,[name]. He states that Virginia [name] has had some dizziness and nausea since her last treatment on 05/20/20. Virginia [name] is taking her olanzapine at night but nothing else. [name] was unsure if she could take any of her other nausea meds. Advised that she can take either the compazine or the Zofran if she needs for nausea.  Also advised to make sure she stays well hydrated as that can be causing some dizziness. [name] voiced understanding.

## 2020-05-26 MED FILL — XTAMPZA ER 13.5 MG C12A: 13.5 | 30 days supply | Qty: 60 | Fill #0

## 2020-05-27 ENCOUNTER — Telehealth: Payer: Self-pay | Admitting: *Deleted

## 2020-05-27 NOTE — Telephone Encounter (Signed)
Patients husband called and left message seeking advice.  He states that patient is having dizziness and headache.    Upon review of last note she was also complaining of the dizziness and reporting taking Olanzapine for the nausea she was having.  Dizziness is a side effect of that drug.  She was encouraged to try Zofran or compazine.  The new symptom of headache along with the dizziness could be related to zofran or compazine as well.  Attempted to call patient back to discuss known side effects of these medications.  Also to inquire about hydration status.    Left message for spouse to call back.

## 2020-05-31 MED FILL — OLANZapine 2.5 MG TABS: 2.5 | 90 days supply | Qty: 90 | Fill #0

## 2020-06-08 ENCOUNTER — Other Ambulatory Visit: Payer: Self-pay | Admitting: Hematology and Oncology

## 2020-06-08 DIAGNOSIS — C3490 Malignant neoplasm of unspecified part of unspecified bronchus or lung: Secondary | ICD-10-CM

## 2020-06-08 DIAGNOSIS — C7951 Secondary malignant neoplasm of bone: Secondary | ICD-10-CM

## 2020-06-08 NOTE — Progress Notes (Signed)
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 CT of the head showed showed concerning for metastatic disease involving the left cerebellum and right occipital lobe.  2) 09/30/2019: MRI brain confirms mass within the left cerebellum measures 1.6 x 1.3 x 1.5 cm as well as at least 8 metastatic lesions within the supratentorial and infratentorial brain as outlined 3) 10/01/2019: PET CT scan revealed hypermetabolic infrahilar right lower lobe mass with hypermetabolic nodal metastases in the right hilum, mediastinum, right supraclavicular region, left axilla and lower left neck. 4) 10/08/2019: US guided biopsy of the lymph nodes confirms poorly differentiated non-small cell carcinoma. Immunohistochemistry confirms adenocarcinoma of lung primary. PD-L1 and NGS later revealed a MET Exon 14 mutation and TPS of 90%.  5) 10/12/2019: Establish care with Dr. Lorenso Courier  6) 11/04/2019: Day 1 of capmatinib $RemoveBefor'400mg'mZolJaQOezkQ$  BID 7) 11/09/2019: presented to Rad/Onc with a DVT in LLE. Seen in symptom management clinic and started on apixaban.  8) 12/29/2019: CT C/A/P showed interval decrease in size of mass involving the superior segment of right lower lobe and reduction in mediastinal and left supraclavicular adenopathy though some small spinal lesions were noted in the interim between the two sets of scans 9) 01/25/2020: temporarily stopped capmatinib due to symptoms of nausea/fatigue. Restarted therapy on 01/30/2020 after brief chemo holiday.  10) 03/22/2020: CT C/A/P reveals a mixed picture, with new lung nodule and  increased lymphadenopathy, however response in bone lesions. D/c capmatinib therapy 11) 04/08/2020: start of Carbo/Pem/Pem for 2nd line treatment. Cycle 1 Day 1   12) 04/29/2020: Cycle 2 Day 1 Carbo/Pem/Pem  13) 05/20/2020: Cycle 3 Day 1 Carbo/Pem/Pem  14) 06/09/2020: Cycle 4 Day 1 Carbo/Pem/Pem   Interval History:  Virginia [name] 64 y.o. female with medical history significant for metastatic adenocarcinoma of the lung presents for a follow up visit. The patient's last visit was on 05/20/2020 prior to the start of Cycle 3 Day 1 of Carbo/Pem/Pem. In the interim since the last visit she has had no new symptoms.   On exam today Virginia [name] she did have a bout of dizziness and increased sleepiness after her last round of chemotherapy.  She notes that she is having some discomfort in her left ear and was having some minor issues with balance.  She reports that it has subsequently improved over the last 2 weeks and that initially it felt like her "head was spinning".  She notes that she is not had any falls as result of this and is otherwise been quite well.  Her appetite has been good, her weight has been stable, and she believes that her pain is well controlled at this time.  Pain is well controlled currently on her pain regimen including the long-acting Xtampza scheduled and oxycodone PRN (she notes using this 1-2 times daily).   Her anticoagulant therapy has been discontinued as a result of concern for brain bleed on her last MRI. She has had no signs or symtpom of recurrent VTE since her last visit. Her legs have baseline level of swelling with no chest pain or shortness of breath.    On further discussion she denies  having any issues with fevers, chills, sweats, diarrhea, or constipation.  She notes that her breathing has been good and she has not had any issues with cough, shortness of breath, or chest pain.  A full 10 point ROS is listed below.  MEDICAL HISTORY:  Past Medical History:  Diagnosis  Date  . Anemia   . Anxiety   . Concussion 09/28/2019  . DVT (deep venous thrombosis) ([place]) 2021   L leg  . Dyspnea   . GERD (gastroesophageal reflux disease)   . Hypercholesterolemia    per pt, she does not have elevated lipids  . Hypertension   . met lung ca dx'd 09/2019   mets to spine, hip and brain  . PONV (postoperative nausea and vomiting)   . Tobacco abuse     SURGICAL HISTORY: Past Surgical History:  Procedure Laterality Date  . ABDOMINAL HYSTERECTOMY     partial/ left ovaries  . CHOLECYSTECTOMY    . DILATION AND CURETTAGE OF UTERUS    . IR IMAGING GUIDED PORT INSERTION  10/23/2019  . KYPHOPLASTY N/A 03/15/2020   Procedure: Thoracic Eight KYPHOPLASTY;  Surgeon: Erline Levine, MD;  Location: [facility];  Service: Neurosurgery;  Laterality: N/A;  prone   . LIPOMA EXCISION  2018   removed under left breast and right thigh.  . TUBAL LIGATION      ALLERGIES:  has No Known Allergies.  MEDICATIONS:  Current Outpatient Medications  Medication Sig Dispense Refill  . albuterol (VENTOLIN HFA) 108 (90 Base) MCG/ACT inhaler Inhale 2 puffs into the lungs every 6 (six) hours as needed for wheezing or shortness of breath. 6.7 g 11  . ALPRAZolam (XANAX) 0.25 MG tablet Take 1 tablet (0.25 mg total) by mouth 2 (two) times daily as needed. for anxiety (Patient taking differently: Take 0.25 mg by mouth 2 (two) times daily as needed for anxiety. ) 60 tablet 5  . apixaban (ELIQUIS) 5 MG TABS tablet Take 1 tablet (5 mg total) by mouth 2 (two) times daily. (Patient not taking: Reported on 05/20/2020) 60 tablet 1  . Cyanocobalamin (VITAMIN B12 PO) Take 2 tablets by mouth daily. Gummies    . folic acid (FOLVITE) 1 MG tablet Take 1 tablet (1 mg total) by mouth daily. 90 tablet 1  . furosemide (LASIX) 20 MG tablet 20 mg once daily for 3 days, then stop. May repeat if edema recurs. (Patient not taking: Reported on 05/20/2020) 30 tablet 1  . glycerin adult 2 g suppository Place 1 suppository rectally as  needed for constipation. (Patient not taking: Reported on 05/20/2020) 12 suppository 0  . lidocaine-prilocaine (EMLA) cream Apply 1 application topically as needed. Apply to port as needed. (Patient taking differently: Apply 1 application topically as needed (Apply to port as needed.). ) 30 g 0  . methocarbamol (ROBAXIN) 500 MG tablet Take 500 mg by mouth 3 (three) times daily as needed. (Patient not taking: Reported on 05/20/2020)    . Multiple Vitamins-Minerals (AIRBORNE PO) Take 1 tablet by mouth daily.     Marland Kitchen ofloxacin (OCUFLOX) 0.3 % ophthalmic solution 2 drops each eye 4 times a day for 5 days (Patient not taking: Reported on 03/09/2020) 5 mL 1  . OLANZapine (ZYPREXA) 2.5 MG tablet Take 1 tablet (2.5 mg total) by mouth at bedtime. 90 tablet 1  . oxyCODONE (OXY IR/ROXICODONE) 5 MG immediate release tablet TAKE 1 TO 2 TABLETS BY MOUTH EVERY 4 HOURS AS NEEDED FOR SEVERE OR BREAKTHROUGH PAIN 90 tablet 0  . pantoprazole (  PROTONIX) 40 MG tablet TAKE 1 TABLET(40 MG) BY MOUTH DAILY (Patient taking differently: Take 40 mg by mouth daily. ) 90 tablet 3  . polyethylene glycol (MIRALAX / GLYCOLAX) 17 g packet Take 17 g by mouth 2 (two) times daily.     . prochlorperazine (COMPAZINE) 10 MG tablet TAKE 1 TABLET BY MOUTH EVERY 6 HOURS AS NEEDED FOR NAUSEA OR VOMITING (Patient not taking: Reported on 02/25/2020) 30 tablet 0  . sennosides-docusate sodium (SENOKOT-S) 8.6-50 MG tablet Take 2-3 tablets by mouth See admin instructions. 2 in the morning and 3 at night. Adjust as needed (Patient not taking: Reported on 05/20/2020)    . sucralfate (CARAFATE) 1 g tablet Dissolve 1 tablet in 10 mL H20 and swallow 30 min prior to meals and bedtime. (Patient not taking: Reported on 02/25/2020) 50 tablet 3  . Vitamin D3 (VITAMIN D) 25 MCG tablet Take 2,000 Units by mouth daily.    Marland Kitchen XTAMPZA ER 13.5 MG C12A Take 1 capsule by mouth every 12 (twelve) hours. 60 capsule 0   No current facility-administered medications for this visit.     REVIEW OF SYSTEMS:   Constitutional: ( - ) fevers, ( - )  chills , ( - ) night sweats Eyes: ( - ) blurriness of vision, ( - ) double vision, ( - ) watery eyes Ears, nose, mouth, throat, and face: ( - ) mucositis, ( - ) sore throat Respiratory: ( - ) cough, ( - ) dyspnea, ( - ) wheezes Cardiovascular: ( - ) palpitation, ( - ) chest discomfort, ( - ) lower extremity swelling Gastrointestinal:  ( - ) nausea, ( - ) heartburn, ( - ) change in bowel habits Skin: ( - ) abnormal skin rashes Lymphatics: ( - ) new lymphadenopathy, ( - ) easy bruising Neurological: ( - ) numbness, ( - ) tingling, ( - ) new weaknesses Behavioral/Psych: ( - ) mood change, ( - ) new changes  All other systems were reviewed with the patient and are negative.  PHYSICAL EXAMINATION: ECOG PERFORMANCE STATUS: 1 - Symptomatic but completely ambulatory  Vitals:   06/09/20 1114  BP: (!) 134/95  Pulse: 86  Resp: 18  Temp: 98.5 F (36.9 C)  SpO2: 98%   Filed Weights   06/09/20 1114  Weight: 134 lb 1.6 oz (60.8 kg)    GENERAL: well appearing middle aged Caucasian female in NAD  SKIN: skin color, texture, turgor are normal, no rashes or significant lesions EAR: left ear tympanic membrane mildly erythematous. No discharge or clear signs of bacterial infection.  EYES: conjunctiva are pink and non-injected, sclera clear LUNGS: clear to auscultation and percussion with normal breathing effort HEART: regular rate & rhythm and no murmurs and bilateral + trace pitting lower extremity edema Musculoskeletal: no cyanosis of digits and no clubbing PSYCH: alert & oriented x 3, fluent speech NEURO: no focal motor/sensory deficits   LABORATORY DATA:  I have reviewed the data as listed CBC Latest Ref Rng & Units 06/09/2020 05/20/2020 04/28/2020  WBC 4.0 - 10.5 K/uL 3.8(L) 3.9(L) 4.5  Hemoglobin 12.0 - 15.0 g/dL 10.5(L) 10.8(L) 10.7(L)  Hematocrit 36 - 46 % 30.5(L) 31.7(L) 31.2(L)  Platelets 150 - 400 K/uL 235 248 331     CMP Latest Ref Rng & Units 06/09/2020 05/20/2020 04/28/2020  Glucose 70 - 99 mg/dL 86 101(H) 93  BUN 8 - 23 mg/dL 11 7(L) 10  Creatinine 0.44 - 1.00 mg/dL 0.57 0.59 0.57  Sodium 135 - 145 mmol/L 140 141  140  Potassium 3.5 - 5.1 mmol/L 4.0 3.9 3.9  Chloride 98 - 111 mmol/L 107 110 108  CO2 22 - 32 mmol/L _0 Calcium 8.9 - 10.3 mg/dL 9.0 8.8(L) 9.3  Total Protein 6.5 - 8.1 g/dL 6.1(L) 6.4(L) 6.1(L)  Total Bilirubin 0.3 - 1.2 mg/dL 0.2(L) 0.3 <0.2(L)  Alkaline Phos 38 - 126 U/L 74 93 80  AST 15 - 41 U/L 25 40 23  ALT 0 - 44 U/L 46(H) 60(H) 31     RADIOGRAPHIC STUDIES: I personally have viewed the radiographic studies below: CT C/A/P showed increased lymphadenopathy of supraclavicular node with new lung nodule. Bone lesions responding. Evidence of T8 kyphoplasty. No results found.  ASSESSMENT & PLAN CLARINE ELROD 64 y.o. female with medical history significant for metastatic adenocarcinoma of the lung presents for a follow up visit. 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   Today Virginia [name] reports she is feeling well overall other than some left ear discomfort and a recent bout of dizziness.  Overall she is willing and able to proceed with chemotherapy had no reservations about moving forward.  She is also not demonstrating any symptoms of recurrent VTE or signs or symptoms concerning for worsening brain bleed.  Previously we discussed Carboplatin/Pembrolizumab/Pemetrexed and the expected side effect from these medications.  We discussed the possible side effects of immunotherapy including colitis, hepatitis, dermatitis, and pneumonitis.  Additionally we discussed the side effects of carboplatin which include suppression of blood counts, nephrotoxicity, and nausea.  Additionally we discussed pemetrexed which can also have effects on counts and would require supplementation with vitamin V40 and folic acid.  She voiced her understanding of  these side effects and treatment plans moving forward.  I also noted that we would be able to drop the chemotherapy agents that she had intolerance to continue with pembrolizumab alone given her high TPS score.  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   --patient will be followed every 3 weeks with labs and clinic visit. --port in place, patient completed chemotherapy education.  --plan for repeat CT C/A/P in Oct 2021 (3 months from last scan in July 2021) --after cycle 4 consider de-escalation to maintenance pem-pem.  --supportive therapy as listed below.  --RTC q 3 weeks for continued monitoring/chemotherapy treatments.    #Nausea/Vomiting -- provided patient with Zofran 4-56m q8h PRN and compazine 140mq6H for breakthrough PRN. These have not been required since olanzapine was added --continue olanzpaine 2.72m572mO QHS to help with nausea. Marked improvement since the addition of this medication on 01/29/2020.   --continue to monitor   #Hypokalemia, stable --likely 2/2 to decreased PO intake, hypovolemia, and BP medications --given her decreased BP would recommend holding losartan and hydrochlorothiazide --continue to monitor  #Pain Control, improving --continue oxycodone 72mg78mH PRN. Can increase dose to 10mg11m if pain is severe. She is taking approximately 1-2 pills per day.  --continue Xtampza for long acting support.  --continue to take with Senokot to prevent opioid induced constipation. 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  --nausea as above   #Headache, resolved --appreciate the assistance of Dr. VasloMickeal Skinnerreating her brain metastasis and symptoms. --radiation therapy to  the brain completed on 11/16/2019.  --MRI brain on 04/29/2020  showed evidence of small brain bleed. Eliquis therapy was discontinued.  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    Orders Placed This Encounter  Procedures  . CT CHEST ABDOMEN PELVIS W CONTRAST    Standing Status:   Future    Standing Expiration Date:   06/10/2021    Order Specific Question:   If indicated for the ordered procedure, I authorize the administration of contrast media per Radiology protocol    Answer:   Yes    Order Specific Question:   Preferred imaging location?    Answer:   [facility]    Order Specific Question:   Is Oral Contrast requested for this exam?    Answer:   Yes, Per Radiology protocol    Order Specific Question:   Reason for Exam (SYMPTOM  OR DIAGNOSIS REQUIRED)    Answer:   metastatic lung cancer on chemo, assess for progression   All questions were answered. The patient knows to call the clinic with any problems, questions or concerns.  A total of more than 30 minutes were spent on this encounter and over half of that time was spent on counseling and coordination of care as outlined above.   Ledell Peoples, MD Department of Hematology/Oncology [place] [facility] Phone: [phone] Pager: [phone] Email: [email or web address]  06/10/2020 11:07 AM   Literature Support:  Lorenza Chick, Rodrguez-Abreu D, Duffy Rhody, Felip E, [place] F, Domine M, [place], Hochmair MJ, [place], [place], Bischoff HG, [place], Grossi F, [place], Reck M, [place], [place], [place], Rubio-Viqueira B, Novello S, Kurata T, Gray JE, Vida J, Wei Z, Yang J, Raftopoulos H, [place], [place] [employer]; [id number] Investigators. Pembrolizumab plus Chemotherapy in Metastatic Non-Small-Cell Lung Cancer. [place] Med. 2018 May 31;378(22):2078-2092.  --Median  progression-free survival was 8.8 months (95% CI, 7.6 to 9.2) in the pembrolizumab-combination group and 4.9 months (95% CI, 4.7 to 5.5) in the placebo-combination group (hazard ratio for disease progression or death, 0.52; 95% CI, 0.43 to 0.64; P<0.001). Adverse events of grade 3 or higher occurred in 67.2% of the patients in the pembrolizumab-combination group and in 65.8% of those in the placebo-combination group.  Jodene Nam L. Efficacy of pemetrexed-based regimens in advanced non-small cell lung cancer patients with activating epidermal growth factor receptor mutations after tyrosine kinase inhibitor failure: a systematic review. Onco Targets Ther. 2018;11:2121-2129.   --The weighted median PFS, median OS, and ORR for patients treated with pem regimens were 5.09 months, 15.91 months, and 30.19%, respectively. Our systematic review results showed a favorable efficacy profile of pem regimens in NSCLC patients with EGFR mutation after EGFR-TKI failure.

## 2020-06-09 ENCOUNTER — Other Ambulatory Visit: Payer: Self-pay

## 2020-06-09 ENCOUNTER — Inpatient Hospital Stay: Payer: BC Managed Care – PPO

## 2020-06-09 ENCOUNTER — Inpatient Hospital Stay (HOSPITAL_BASED_OUTPATIENT_CLINIC_OR_DEPARTMENT_OTHER): Payer: BC Managed Care – PPO | Admitting: Hematology and Oncology

## 2020-06-09 VITALS — BP 134/95 | HR 86 | Temp 98.5°F | Resp 18 | Ht 62.0 in | Wt 134.1 lb

## 2020-06-09 DIAGNOSIS — Z86718 Personal history of other venous thrombosis and embolism: Secondary | ICD-10-CM | POA: Diagnosis not present

## 2020-06-09 DIAGNOSIS — R918 Other nonspecific abnormal finding of lung field: Secondary | ICD-10-CM

## 2020-06-09 DIAGNOSIS — Z79899 Other long term (current) drug therapy: Secondary | ICD-10-CM | POA: Diagnosis not present

## 2020-06-09 DIAGNOSIS — C3490 Malignant neoplasm of unspecified part of unspecified bronchus or lung: Secondary | ICD-10-CM | POA: Diagnosis not present

## 2020-06-09 DIAGNOSIS — C7951 Secondary malignant neoplasm of bone: Secondary | ICD-10-CM | POA: Diagnosis not present

## 2020-06-09 DIAGNOSIS — Z5111 Encounter for antineoplastic chemotherapy: Secondary | ICD-10-CM

## 2020-06-09 DIAGNOSIS — R52 Pain, unspecified: Secondary | ICD-10-CM | POA: Diagnosis not present

## 2020-06-09 DIAGNOSIS — Z23 Encounter for immunization: Secondary | ICD-10-CM

## 2020-06-09 DIAGNOSIS — R112 Nausea with vomiting, unspecified: Secondary | ICD-10-CM | POA: Diagnosis not present

## 2020-06-09 DIAGNOSIS — Z5112 Encounter for antineoplastic immunotherapy: Secondary | ICD-10-CM | POA: Diagnosis not present

## 2020-06-09 DIAGNOSIS — C3431 Malignant neoplasm of lower lobe, right bronchus or lung: Secondary | ICD-10-CM | POA: Diagnosis not present

## 2020-06-09 DIAGNOSIS — E876 Hypokalemia: Secondary | ICD-10-CM | POA: Diagnosis not present

## 2020-06-09 DIAGNOSIS — C778 Secondary and unspecified malignant neoplasm of lymph nodes of multiple regions: Secondary | ICD-10-CM | POA: Diagnosis not present

## 2020-06-09 DIAGNOSIS — C7931 Secondary malignant neoplasm of brain: Secondary | ICD-10-CM

## 2020-06-09 LAB — CBC WITH DIFFERENTIAL (CANCER CENTER ONLY)
Abs Immature Granulocytes: 0.02 10*3/uL (ref 0.00–0.07)
Basophils Absolute: 0 10*3/uL (ref 0.0–0.1)
Basophils Relative: 1 %
Eosinophils Absolute: 0.1 10*3/uL (ref 0.0–0.5)
Eosinophils Relative: 2 %
HCT: 30.5 % — ABNORMAL LOW (ref 36.0–46.0)
Hemoglobin: 10.5 g/dL — ABNORMAL LOW (ref 12.0–15.0)
Immature Granulocytes: 1 %
Lymphocytes Relative: 27 %
Lymphs Abs: 1.1 10*3/uL (ref 0.7–4.0)
MCH: 32.2 pg (ref 26.0–34.0)
MCHC: 34.4 g/dL (ref 30.0–36.0)
MCV: 93.6 fL (ref 80.0–100.0)
Monocytes Absolute: 0.7 10*3/uL (ref 0.1–1.0)
Monocytes Relative: 17 %
Neutro Abs: 2 10*3/uL (ref 1.7–7.7)
Neutrophils Relative %: 52 %
Platelet Count: 235 10*3/uL (ref 150–400)
RBC: 3.26 MIL/uL — ABNORMAL LOW (ref 3.87–5.11)
RDW: 14.8 % (ref 11.5–15.5)
WBC Count: 3.8 10*3/uL — ABNORMAL LOW (ref 4.0–10.5)
nRBC: 0 % (ref 0.0–0.2)

## 2020-06-09 LAB — CMP (CANCER CENTER ONLY)
ALT: 46 U/L — ABNORMAL HIGH (ref 0–44)
AST: 25 U/L (ref 15–41)
Albumin: 3.3 g/dL — ABNORMAL LOW (ref 3.5–5.0)
Alkaline Phosphatase: 74 U/L (ref 38–126)
Anion gap: 5 (ref 5–15)
BUN: 11 mg/dL (ref 8–23)
CO2: 28 mmol/L (ref 22–32)
Calcium: 9 mg/dL (ref 8.9–10.3)
Chloride: 107 mmol/L (ref 98–111)
Creatinine: 0.57 mg/dL (ref 0.44–1.00)
GFR, Est AFR Am: >60 mL/min (ref >60)
GFR, Estimated: >60 mL/min (ref >60)
Glucose, Bld: 86 mg/dL (ref 70–99)
Potassium: 4 mmol/L (ref 3.5–5.1)
Sodium: 140 mmol/L (ref 135–145)
Total Bilirubin: 0.2 mg/dL — ABNORMAL LOW (ref 0.3–1.2)
Total Protein: 6.1 g/dL — ABNORMAL LOW (ref 6.5–8.1)

## 2020-06-09 LAB — TSH: TSH: 1.233 u[IU]/mL (ref 0.308–3.960)

## 2020-06-09 MED ORDER — PALONOSETRON HCL INJECTION 0.25 MG/5ML
INTRAVENOUS | Status: AC
  Filled 2020-06-09: qty 5

## 2020-06-09 MED ORDER — SODIUM CHLORIDE 0.9 % IV SOLN
10.0000 mg | Freq: Once | INTRAVENOUS | Status: AC
  Administered 2020-06-09: 10 mg via INTRAVENOUS
  Filled 2020-06-09: qty 10

## 2020-06-09 MED ORDER — SODIUM CHLORIDE 0.9 % IV SOLN
Freq: Once | INTRAVENOUS | Status: AC
  Filled 2020-06-09: qty 250

## 2020-06-09 MED ORDER — SODIUM CHLORIDE 0.9 % IV SOLN
150.0000 mg | Freq: Once | INTRAVENOUS | Status: AC
  Administered 2020-06-09: 150 mg via INTRAVENOUS
  Filled 2020-06-09: qty 150

## 2020-06-09 MED ORDER — CYANOCOBALAMIN 1000 MCG/ML IJ SOLN
INTRAMUSCULAR | Status: AC
  Filled 2020-06-09: qty 1

## 2020-06-09 MED ORDER — INFLUENZA VAC SPLIT QUAD 0.5 ML IM SUSY
0.5000 mL | PREFILLED_SYRINGE | Freq: Once | INTRAMUSCULAR | Status: AC
  Administered 2020-06-09: 0.5 mL via INTRAMUSCULAR

## 2020-06-09 MED ORDER — SODIUM CHLORIDE 0.9 % IV SOLN
200.0000 mg | Freq: Once | INTRAVENOUS | Status: AC
  Administered 2020-06-09: 200 mg via INTRAVENOUS
  Filled 2020-06-09: qty 8

## 2020-06-09 MED ORDER — INFLUENZA VAC SPLIT QUAD 0.5 ML IM SUSY
PREFILLED_SYRINGE | INTRAMUSCULAR | Status: AC
  Filled 2020-06-09: qty 0.5

## 2020-06-09 MED ORDER — PALONOSETRON HCL INJECTION 0.25 MG/5ML
0.2500 mg | Freq: Once | INTRAVENOUS | Status: AC
  Administered 2020-06-09: 0.25 mg via INTRAVENOUS

## 2020-06-09 MED ORDER — SODIUM CHLORIDE 0.9% FLUSH
10.0000 mL | INTRAVENOUS | Status: DC | PRN
  Administered 2020-06-09: 10 mL
  Filled 2020-06-09: qty 10

## 2020-06-09 MED ORDER — SODIUM CHLORIDE 0.9 % IV SOLN
500.0000 mg/m2 | Freq: Once | INTRAVENOUS | Status: AC
  Administered 2020-06-09: 800 mg via INTRAVENOUS
  Filled 2020-06-09: qty 12

## 2020-06-09 MED ORDER — SODIUM CHLORIDE 0.9 % IV SOLN
513.0000 mg | Freq: Once | INTRAVENOUS | Status: AC
  Administered 2020-06-09: 510 mg via INTRAVENOUS
  Filled 2020-06-09: qty 51

## 2020-06-09 MED ORDER — CYANOCOBALAMIN 1000 MCG/ML IJ SOLN
1000.0000 ug | Freq: Once | INTRAMUSCULAR | Status: AC
  Administered 2020-06-09: 1000 ug via INTRAMUSCULAR

## 2020-06-09 NOTE — Patient Instructions (Signed)
[facility] Discharge Instructions for Patients Receiving Chemotherapy  Today you received the following chemotherapy agents: Keytruda/Alimta/Carboplatin.  To help prevent nausea and vomiting after your treatment, we encourage you to take your nausea medication as directed.   If you develop nausea and vomiting that is not controlled by your nausea medication, call the clinic.   BELOW ARE SYMPTOMS THAT SHOULD BE REPORTED IMMEDIATELY:  *FEVER GREATER THAN 100.5 F  *CHILLS WITH OR WITHOUT FEVER  NAUSEA AND VOMITING THAT IS NOT CONTROLLED WITH YOUR NAUSEA MEDICATION  *UNUSUAL SHORTNESS OF BREATH  *UNUSUAL BRUISING OR BLEEDING  TENDERNESS IN MOUTH AND THROAT WITH OR WITHOUT PRESENCE OF ULCERS  *URINARY PROBLEMS  *BOWEL PROBLEMS  UNUSUAL RASH Items with * indicate a potential emergency and should be followed up as soon as possible.  Feel free to call the clinic should you have any questions or concerns. The clinic phone number is [phone].  Please show the [place] at check-in to the Emergency Department and triage nurse.  Zoledronic Acid injection (Hypercalcemia, Oncology) What is this medicine? ZOLEDRONIC ACID (ZOE le dron ik AS id) lowers the amount of calcium loss from bone. It is used to treat too much calcium in your blood from cancer. It is also used to prevent complications of cancer that has spread to the bone. This medicine may be used for other purposes; ask your health care provider or pharmacist if you have questions. COMMON BRAND NAME(S): Zometa What should I tell my health care provider before I take this medicine? They need to know if you have any of these conditions:  aspirin-sensitive asthma  cancer, especially if you are receiving medicines used to treat cancer  dental disease or wear dentures  infection  kidney disease  receiving corticosteroids like dexamethasone or prednisone  an unusual or allergic reaction to  zoledronic acid, other medicines, foods, dyes, or preservatives  pregnant or trying to get pregnant  breast-feeding How should I use this medicine? This medicine is for infusion into a vein. It is given by a health care professional in a hospital or clinic setting. Talk to your pediatrician regarding the use of this medicine in children. Special care may be needed. Overdosage: If you think you have taken too much of this medicine contact a poison control center or emergency room at once. NOTE: This medicine is only for you. Do not share this medicine with others. What if I miss a dose? It is important not to miss your dose. Call your doctor or health care professional if you are unable to keep an appointment. What may interact with this medicine?  certain antibiotics given by injection  NSAIDs, medicines for pain and inflammation, like ibuprofen or naproxen  some diuretics like bumetanide, furosemide  teriparatide  thalidomide This list may not describe all possible interactions. Give your health care provider a list of all the medicines, herbs, non-prescription drugs, or dietary supplements you use. Also tell them if you smoke, drink alcohol, or use illegal drugs. Some items may interact with your medicine. What should I watch for while using this medicine? Visit your doctor or health care professional for regular checkups. It may be some time before you see the benefit from this medicine. Do not stop taking your medicine unless your doctor tells you to. Your doctor may order blood tests or other tests to see how you are doing. Women should inform their doctor if they wish to become pregnant or think they might be pregnant.  There is a potential for serious side effects to an unborn child. Talk to your health care professional or pharmacist for more information. You should make sure that you get enough calcium and vitamin D while you are taking this medicine. Discuss the foods you eat and  the vitamins you take with your health care professional. Some people who take this medicine have severe bone, joint, and/or muscle pain. This medicine may also increase your risk for jaw problems or a broken thigh bone. Tell your doctor right away if you have severe pain in your jaw, bones, joints, or muscles. Tell your doctor if you have any pain that does not go away or that gets worse. Tell your dentist and dental surgeon that you are taking this medicine. You should not have major dental surgery while on this medicine. See your dentist to have a dental exam and fix any dental problems before starting this medicine. Take good care of your teeth while on this medicine. Make sure you see your dentist for regular follow-up appointments. What side effects may I notice from receiving this medicine? Side effects that you should report to your doctor or health care professional as soon as possible:  allergic reactions like skin rash, itching or hives, swelling of the face, lips, or tongue  anxiety, confusion, or depression  breathing problems  changes in vision  eye pain  feeling faint or lightheaded, falls  jaw pain, especially after dental work  mouth sores  muscle cramps, stiffness, or weakness  redness, blistering, peeling or loosening of the skin, including inside the mouth  trouble passing urine or change in the amount of urine Side effects that usually do not require medical attention (report to your doctor or health care professional if they continue or are bothersome):  bone, joint, or muscle pain  constipation  diarrhea  fever  hair loss  irritation at site where injected  loss of appetite  nausea, vomiting  stomach upset  trouble sleeping  trouble swallowing  weak or tired This list may not describe all possible side effects. Call your doctor for medical advice about side effects. You may report side effects to FDA at 1-800-FDA-1088. Where should I keep my  medicine? This drug is given in a hospital or clinic and will not be stored at home. NOTE: This sheet is a summary. It may not cover all possible information. If you have questions about this medicine, talk to your doctor, pharmacist, or health care provider.  2020 Elsevier/Gold Standard (2014-01-23 14:19:39)

## 2020-06-10 ENCOUNTER — Encounter: Payer: Self-pay | Admitting: Hematology and Oncology

## 2020-06-10 ENCOUNTER — Other Ambulatory Visit: Payer: Self-pay | Admitting: Hematology and Oncology

## 2020-06-16 ENCOUNTER — Other Ambulatory Visit: Payer: Self-pay | Admitting: Hematology and Oncology

## 2020-06-16 ENCOUNTER — Telehealth: Payer: Self-pay | Admitting: *Deleted

## 2020-06-16 MED FILL — oxyCODONE HCL 5 MG TABS: 5 | 8 days supply | Qty: 90 | Fill #0

## 2020-06-16 NOTE — Telephone Encounter (Signed)
Received call from pt's husband.  He is requesting refill of Virginia [name]'s oxycodone 5 mg tabs. He is also requesting that her chemo appt for December to be scheduled on 08/12/20 (they will be returning from [place] on 08/11/20) and then for her next treatment they are requesting September 12, 2020

## 2020-06-20 ENCOUNTER — Other Ambulatory Visit: Payer: Self-pay | Admitting: Hematology and Oncology

## 2020-06-23 ENCOUNTER — Ambulatory Visit (HOSPITAL_COMMUNITY)
Admission: RE | Admit: 2020-06-23 | Discharge: 2020-06-23 | Disposition: A | Discharge status: Home / Self Care | Payer: BC Managed Care – PPO | Source: Ambulatory Visit | Attending: Hematology and Oncology | Admitting: Hematology and Oncology

## 2020-06-23 ENCOUNTER — Other Ambulatory Visit: Payer: Self-pay

## 2020-06-23 ENCOUNTER — Telehealth: Payer: Self-pay | Admitting: *Deleted

## 2020-06-23 DIAGNOSIS — I700 Atherosclerosis of aorta: Secondary | ICD-10-CM | POA: Diagnosis not present

## 2020-06-23 DIAGNOSIS — I2510 Atherosclerotic heart disease of native coronary artery without angina pectoris: Secondary | ICD-10-CM | POA: Diagnosis not present

## 2020-06-23 DIAGNOSIS — C7951 Secondary malignant neoplasm of bone: Secondary | ICD-10-CM | POA: Diagnosis not present

## 2020-06-23 DIAGNOSIS — C3490 Malignant neoplasm of unspecified part of unspecified bronchus or lung: Secondary | ICD-10-CM | POA: Insufficient documentation

## 2020-06-23 DIAGNOSIS — I313 Pericardial effusion (noninflammatory): Secondary | ICD-10-CM | POA: Diagnosis not present

## 2020-06-23 DIAGNOSIS — S22060A Wedge compression fracture of T7-T8 vertebra, initial encounter for closed fracture: Secondary | ICD-10-CM | POA: Diagnosis not present

## 2020-06-23 MED ORDER — IOHEXOL 300 MG/ML  SOLN
100.0000 mL | Freq: Once | INTRAMUSCULAR | Status: AC | PRN
  Administered 2020-06-23: 100 mL via INTRAVENOUS

## 2020-06-23 MED FILL — FOLIC ACID 1 MG TABS: 1 | 90 days supply | Qty: 90 | Fill #1

## 2020-06-23 NOTE — Telephone Encounter (Signed)
TCT patient's husband, [name]. Informed him that [name]'s repeat CT scans were good, with lung nodules smaller in size and lesion is T8 is stable. No new mets seen.  [name] very happy about this and will share with [name]. He is asking about prior auth for pt's Xtampza ER 13.5 mg he states [facility] needs Prior Auth. Passed this information on to Almond Lint, RN  Randy aware of this.

## 2020-06-24 MED FILL — XTAMPZA ER 13.5 MG C12A: 13.5 | 30 days supply | Qty: 60 | Fill #0

## 2020-06-27 NOTE — Progress Notes (Signed)
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 CT of the head showed showed concerning for metastatic disease involving the left cerebellum and right occipital lobe.  2) 09/30/2019: MRI brain confirms mass within the left cerebellum measures 1.6 x 1.3 x 1.5 cm as well as at least 8 metastatic lesions within the supratentorial and infratentorial brain as outlined 3) 10/01/2019: PET CT scan revealed hypermetabolic infrahilar right lower lobe mass with hypermetabolic nodal metastases in the right hilum, mediastinum, right supraclavicular region, left axilla and lower left neck. 4) 10/08/2019: US guided biopsy of the lymph nodes confirms poorly differentiated non-small cell carcinoma. Immunohistochemistry confirms adenocarcinoma of lung primary. PD-L1 and NGS later revealed a MET Exon 14 mutation and TPS of 90%.  5) 10/12/2019: Establish care with Dr. Leonides [name]  6) 11/04/2019: Day 1 of capmatinib 400mg  BID 7) 11/09/2019: presented to Rad/Onc with a DVT in LLE. Seen in symptom management clinic and started on apixaban.  8) 12/29/2019: CT C/A/P showed interval decrease in size of mass involving the superior segment of right lower lobe and reduction in mediastinal and left supraclavicular adenopathy though some small spinal lesions were noted in the interim between the two sets of scans 9) 01/25/2020: temporarily stopped capmatinib due to symptoms of nausea/fatigue. Restarted therapy on 01/30/2020 after brief chemo holiday.  10) 03/22/2020: CT C/A/P reveals a mixed picture, with new lung nodule and  increased lymphadenopathy, however response in bone lesions. D/c capmatinib therapy 11) 04/08/2020: start of Carbo/Pem/Pem for 2nd line treatment. Cycle 1 Day 1   12) 04/29/2020: Cycle 2 Day 1 Carbo/Pem/Pem  13) 05/20/2020: Cycle 3 Day 1 Carbo/Pem/Pem  14) 06/09/2020: Cycle 4 Day 1 Carbo/Pem/Pem  15) 06/29/2020: Cycle 5 Day 1 maintenance Pem/Pem   Interval History:  Virginia [name] 64 y.o. female with medical history significant for metastatic adenocarcinoma of the lung presents for a follow up visit. The patient's last visit was on 06/09/2020 prior to the start of Cycle 4 Day 1 of Carbo/Pem/Pem. In the interim since the last visit she has had no new symptoms. Today is Cycle 5 Day 1.   On exam today Virginia [name] is accompanied by her husband.  She reports that she has been doing well since her last visit.  She reports that she has good appetite and she may "eat too much".  She reports she is not having issues with nausea, vomiting, or diarrhea.  She also notes that her pain is been under good control with the [facility] and she is taking an additional 2 oxycodones per day.  Despite his heavy opioid use she does continue to have frequent bowel movements aided by recent addition of MiraLAX.  She notes otherwise she has had no problem with fevers, chills, sweats, shortness of breath, or chest pain.  A full 10 point ROS is listed below.    MEDICAL HISTORY:  Past Medical History:  Diagnosis Date  . Anemia   . Anxiety   . Concussion 09/28/2019  . DVT (deep venous thrombosis) (HCC) 2021   L leg  . Dyspnea   . GERD (gastroesophageal reflux disease)   . Hypercholesterolemia  per pt, she does not have elevated lipids  . Hypertension   . met lung ca dx'd 09/2019   mets to spine, hip and brain  . PONV (postoperative nausea and vomiting)   . Tobacco abuse     SURGICAL HISTORY: Past Surgical History:  Procedure Laterality Date  . ABDOMINAL HYSTERECTOMY     partial/ left ovaries  .  CHOLECYSTECTOMY    . DILATION AND CURETTAGE OF UTERUS    . IR IMAGING GUIDED PORT INSERTION  10/23/2019  . KYPHOPLASTY N/A 03/15/2020   Procedure: Thoracic Eight KYPHOPLASTY;  Surgeon: Erline Levine, MD;  Location: [facility];  Service: Neurosurgery;  Laterality: N/A;  prone   . LIPOMA EXCISION  2018   removed under left breast and right thigh.  . TUBAL LIGATION      ALLERGIES:  has No Known Allergies.  MEDICATIONS:  Current Outpatient Medications  Medication Sig Dispense Refill  . albuterol (VENTOLIN HFA) 108 (90 Base) MCG/ACT inhaler Inhale 2 puffs into the lungs every 6 (six) hours as needed for wheezing or shortness of breath. 6.7 g 11  . ALPRAZolam (XANAX) 0.25 MG tablet Take 1 tablet (0.25 mg total) by mouth 2 (two) times daily as needed. for anxiety (Patient taking differently: Take 0.25 mg by mouth 2 (two) times daily as needed for anxiety. ) 60 tablet 5  . apixaban (ELIQUIS) 5 MG TABS tablet Take 1 tablet (5 mg total) by mouth 2 (two) times daily. (Patient not taking: Reported on 05/20/2020) 60 tablet 1  . Cyanocobalamin (VITAMIN B12 PO) Take 2 tablets by mouth daily. Gummies    . folic acid (FOLVITE) 1 MG tablet Take 1 tablet (1 mg total) by mouth daily. 90 tablet 1  . furosemide (LASIX) 20 MG tablet 20 mg once daily for 3 days, then stop. May repeat if edema recurs. (Patient not taking: Reported on 05/20/2020) 30 tablet 1  . glycerin adult 2 g suppository Place 1 suppository rectally as needed for constipation. (Patient not taking: Reported on 05/20/2020) 12 suppository 0  . lidocaine-prilocaine (EMLA) cream Apply 1 application topically as needed. Apply to port as needed. (Patient taking differently: Apply 1 application topically as needed (Apply to port as needed.). ) 30 g 0  . methocarbamol (ROBAXIN) 500 MG tablet Take 500 mg by mouth 3 (three) times daily as needed. (Patient not taking: Reported on 05/20/2020)    . Multiple Vitamins-Minerals (AIRBORNE PO) Take 1 tablet by mouth daily.      Marland Kitchen ofloxacin (OCUFLOX) 0.3 % ophthalmic solution 2 drops each eye 4 times a day for 5 days (Patient not taking: Reported on 03/09/2020) 5 mL 1  . OLANZapine (ZYPREXA) 2.5 MG tablet Take 1 tablet (2.5 mg total) by mouth at bedtime. 90 tablet 1  . oxyCODONE (OXY IR/ROXICODONE) 5 MG immediate release tablet TAKE 1 TO 2 TABLETS BY MOUTH EVERY 4 HOURS AS NEEDED FOR SEVERE OR BREAKTHROUGH PAIN 90 tablet 0  . pantoprazole (PROTONIX) 40 MG tablet TAKE 1 TABLET(40 MG) BY MOUTH DAILY (Patient taking differently: Take 40 mg by mouth daily. ) 90 tablet 3  . polyethylene glycol (MIRALAX / GLYCOLAX) 17 g packet Take 17 g by mouth 2 (two) times daily.     . prochlorperazine (COMPAZINE) 10 MG tablet TAKE 1 TABLET BY MOUTH EVERY 6 HOURS AS NEEDED FOR NAUSEA OR VOMITING (Patient not taking: Reported on 02/25/2020) 30 tablet 0  . sennosides-docusate sodium (SENOKOT-S) 8.6-50 MG tablet Take 2-3 tablets by mouth See admin instructions. 2 in  the morning and 3 at night. Adjust as needed (Patient not taking: Reported on 05/20/2020)    . sucralfate (CARAFATE) 1 g tablet Dissolve 1 tablet in 10 mL H20 and swallow 30 min prior to meals and bedtime. (Patient not taking: Reported on 02/25/2020) 50 tablet 3  . Vitamin D3 (VITAMIN D) 25 MCG tablet Take 2,000 Units by mouth daily.    Marland Kitchen XTAMPZA ER 13.5 MG C12A TAKE 1 CAPSULE BY MOUTH EVERY 12 HOURS 60 capsule 0   No current facility-administered medications for this visit.   Facility-Administered Medications Ordered in Other Visits  Medication Dose Route Frequency Provider Last Rate Last Admin  . heparin lock flush 100 unit/mL  500 Units Intracatheter Once PRN Orson Slick, MD      . pembrolizumab [facility]) 200 mg in sodium chloride 0.9 % 50 mL chemo infusion  200 mg Intravenous Once Ledell Peoples IV, MD      . PEMEtrexed (ALIMTA) 800 mg in sodium chloride 0.9 % 100 mL chemo infusion  500 mg/m2 (Order-Specific) Intravenous Once Narda Rutherford T IV, MD      . sodium chloride flush  (NS) 0.9 % injection 10 mL  10 mL Intracatheter PRN Orson Slick, MD        REVIEW OF SYSTEMS:   Constitutional: ( - ) fevers, ( - )  chills , ( - ) night sweats Eyes: ( - ) blurriness of vision, ( - ) double vision, ( - ) watery eyes Ears, nose, mouth, throat, and face: ( - ) mucositis, ( - ) sore throat Respiratory: ( - ) cough, ( - ) dyspnea, ( - ) wheezes Cardiovascular: ( - ) palpitation, ( - ) chest discomfort, ( - ) lower extremity swelling Gastrointestinal:  ( - ) nausea, ( - ) heartburn, ( - ) change in bowel habits Skin: ( - ) abnormal skin rashes Lymphatics: ( - ) new lymphadenopathy, ( - ) easy bruising Neurological: ( - ) numbness, ( - ) tingling, ( - ) new weaknesses Behavioral/Psych: ( - ) mood change, ( - ) new changes  All other systems were reviewed with the patient and are negative.  PHYSICAL EXAMINATION: ECOG PERFORMANCE STATUS: 1 - Symptomatic but completely ambulatory  Vitals:   06/29/20 1111  BP: (!) 128/95  Pulse: (!) 104  Resp: 17  Temp: 98.2 F (36.8 C)  SpO2: 100%   Filed Weights   06/29/20 1111  Weight: 133 lb 12.8 oz (60.7 kg)    GENERAL: well appearing middle aged Caucasian female in NAD  SKIN: skin color, texture, turgor are normal, no rashes or significant lesions EAR: left ear tympanic membrane mildly erythematous. No discharge or clear signs of bacterial infection.  EYES: conjunctiva are pink and non-injected, sclera clear LUNGS: clear to auscultation and percussion with normal breathing effort HEART: regular rate & rhythm and no murmurs and bilateral + trace pitting lower extremity edema Musculoskeletal: no cyanosis of digits and no clubbing PSYCH: alert & oriented x 3, fluent speech NEURO: no focal motor/sensory deficits   LABORATORY DATA:  I have reviewed the data as listed CBC Latest Ref Rng & Units 06/29/2020 06/09/2020 05/20/2020  WBC 4.0 - 10.5 K/uL 3.6(L) 3.8(L) 3.9(L)  Hemoglobin 12.0 - 15.0 g/dL 10.8(L) 10.5(L) 10.8(L)   Hematocrit 36 - 46 % 31.4(L) 30.5(L) 31.7(L)  Platelets 150 - 400 K/uL 260 235 248    CMP Latest Ref Rng & Units 06/29/2020 06/09/2020 05/20/2020  Glucose 70 - 99  mg/dL 89 86 101(H)  BUN 8 - 23 mg/dL 12 11 7(L)  Creatinine 0.44 - 1.00 mg/dL 0.61 0.57 0.59  Sodium 135 - 145 mmol/L 140 140 141  Potassium 3.5 - 5.1 mmol/L 4.0 4.0 3.9  Chloride 98 - 111 mmol/L 108 107 110  CO2 22 - 32 mmol/L $RemoveB'22 28 23  'TCYrAbVY$ Calcium 8.9 - 10.3 mg/dL 9.3 9.0 8.8(L)  Total Protein 6.5 - 8.1 g/dL 6.3(L) 6.1(L) 6.4(L)  Total Bilirubin 0.3 - 1.2 mg/dL <0.2(L) 0.2(L) 0.3  Alkaline Phos 38 - 126 U/L 69 74 93  AST 15 - 41 U/L 37 25 40  ALT 0 - 44 U/L 62(H) 46(H) 60(H)     RADIOGRAPHIC STUDIES: I personally have viewed the radiographic studies below: CT C/A/P showed increased lymphadenopathy of supraclavicular node with new lung nodule. Bone lesions responding. Evidence of T8 kyphoplasty. CT CHEST ABDOMEN PELVIS W CONTRAST  Result Date: 06/23/2020 CLINICAL DATA:  Non-small cell lung cancer restaging. Ongoing chemotherapy. EXAM: CT CHEST, ABDOMEN, AND PELVIS WITH CONTRAST TECHNIQUE: Multidetector CT imaging of the chest, abdomen and pelvis was performed following the standard protocol during bolus administration of intravenous contrast. CONTRAST:  135mL OMNIPAQUE IOHEXOL 300 MG/ML  SOLN COMPARISON:  03/25/2020 FINDINGS: CT CHEST FINDINGS Cardiovascular: Right Port-A-Cath tip: SVC. Coronary, aortic arch, and branch vessel atherosclerotic vascular disease. Mild mitral valve calcification. Small pericardial effusion, mildly reduced from prior. Borderline cardiomegaly. Mediastinum/Nodes: Indistinctness of tissue planes in the lower paratracheal and subcarinal region likely related to prior therapy. Left supraclavicular lymph node 0.4 cm in short axis on image 6 of series 2, previously 1.4 cm, substantially improved. Lungs/Pleura: Post therapy related findings with increased volume loss medially in the right lower lobe and in the  perihilar regions. Scattered bilateral ground-glass density pulmonary nodules are not appreciably changed. 3 by 4 mm right apical pulmonary nodule is stable on image 27 of series 4. There is some airway plugging in the right lower lobe. The nodular lesion in the vicinity of the airway plugging in the right lower lobe is overall reduced in prominence, currently 0.9 cm in short axis and previously 1.3 cm in short axis. Musculoskeletal: Stable compression fracture with vertebral augmentation at the T8 level. Small sclerotic lesions at other vertebral levels including T10 and T7 are unchanged. CT ABDOMEN PELVIS FINDINGS Hepatobiliary: Cholecystectomy. Common bile duct 0.9 cm in diameter, stable and likely a physiologic response to cholecystectomy. No significant parenchymal lesion is identified in the liver. Pancreas: Unremarkable Spleen: Unremarkable Adrenals/Urinary Tract: Unremarkable Stomach/Bowel: Prominent stool throughout the colon favors constipation. Vascular/Lymphatic: Aortoiliac atherosclerotic vascular disease. Reproductive: Uterus absent.  Adnexa unremarkable. Other: No supplemental non-categorized findings. Musculoskeletal: Stable sclerotic bony lesions in the lumbar spine and bony pelvis, most notably in the right iliac bone posteriorly on image 91 of series 2 and also along the right iliac crest. IMPRESSION: 1. Post therapy related findings in the lungs with increased volume loss medially in the right lower lobe and in the perihilar regions. The nodular lesion in the right lower lobe in the vicinity of the airway plugging is overall reduced in prominence. The left supraclavicular node is prominently reduced in size. No overt mediastinal adenopathy is currently noted. 2. Stable sclerotic bony metastatic lesions. 3. Other imaging findings of potential clinical significance: Coronary atherosclerosis. Small pericardial effusion, mildly reduced from prior. Prominent stool throughout the colon favors  constipation. Stable compression fracture with vertebral augmentation at T8. Mild mitral valve calcification. 4. Aortic atherosclerosis. Aortic Atherosclerosis (ICD10-I70.0). Electronically Signed   By:  Van Clines M.D.   On: 06/23/2020 13:29    ASSESSMENT & PLAN REGENA DELUCCHI 64 y.o. female with medical history significant for metastatic adenocarcinoma of the lung presents for a follow up visit. 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   Today Mrs. Rogness reports she is currently stable at her baseline.  Her pain is currently under good control and she is not having any new symptoms at this time.  Overall she is willing and able to proceed with chemotherapy had no reservations about moving forward.  She is also not demonstrating any symptoms of recurrent VTE or signs or symptoms concerning for worsening brain bleed.  Previously we discussed Carboplatin/Pembrolizumab/Pemetrexed and the expected side effect from these medications.  We discussed the possible side effects of immunotherapy including colitis, hepatitis, dermatitis, and pneumonitis.  Additionally we discussed the side effects of carboplatin which include suppression of blood counts, nephrotoxicity, and nausea.  Additionally we discussed pemetrexed which can also have effects on counts and would require supplementation with vitamin N62 and folic acid.  She voiced her understanding of these side effects and treatment plans moving forward.  I also noted that we would be able to drop the chemotherapy agents that she had intolerance to continue with pembrolizumab alone given her high TPS score.  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 --port in place,  patient completed chemotherapy education.  --plan for repeat CT C/A/P in Jan 2022 (3 months from last scan in Oct 2021) --supportive therapy as listed below.  --RTC q 3 weeks for continued monitoring/chemotherapy treatments.    #Nausea/Vomiting -- provided patient with Zofran 4-8mg  q8h PRN and compazine 10mg  q6H for breakthrough PRN. These have not been required since olanzapine was added --continue olanzpaine 2.5mg  PO QHS to help with nausea. Marked improvement since the addition of this medication on 01/29/2020.   --continue to monitor   #Hypokalemia, stable --likely 2/2 to decreased PO intake, hypovolemia, and BP medications --given her decreased BP would recommend holding losartan and hydrochlorothiazide --continue to monitor  #Pain Control, improving --continue oxycodone 5mg  q4H PRN. Can increase dose to 10mg  q6H if pain is severe. She is taking approximately 1-2 pills per day.  --continue Xtampza for long acting support.  --continue to take with Senokot to prevent opioid induced constipation. 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  --nausea as above   #Headache, resolved --appreciate the assistance of Dr. 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 --radiation therapy to the brain completed on 11/16/2019.  --MRI brain on 04/29/2020 showed evidence of small brain bleed. Eliquis therapy was discontinued.  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    No orders of the defined types were placed in this encounter.  All questions were answered. The patient knows to call the clinic with any problems, questions or concerns.  A total of more than 30 minutes were spent on this encounter and over half of that time  was spent on counseling and coordination of care as outlined above.   Ledell Peoples, MD Department of Hematology/Oncology [place] at [facility] Phone: [phone] Pager: [phone] Email: [email or web address]  06/29/2020 1:28 PM   Literature Support:  Lorenza Chick, Rodrguez-Abreu D, Duffy Rhody, Felip E, [place] F, Domine M, [place], Hochmair MJ, [place], [place], Bischoff HG, [place], Grossi F, [place], Reck M, [place], [place], [place], Rubio-Viqueira B, Novello S, Kurata T, Gray JE, Vida J, Wei Z, Yang J, Raftopoulos H, [place], [place] [employer]; [id number] Investigators. Pembrolizumab plus Chemotherapy in Metastatic Non-Small-Cell Lung Cancer. [place] Med. 2018 May 31;378(22):2078-2092.  --Median progression-free survival was 8.8 months (95% CI, 7.6 to 9.2) in the pembrolizumab-combination group and 4.9 months (95% CI, 4.7 to 5.5) in the placebo-combination group (hazard ratio for disease progression or death, 0.52; 95% CI, 0.43 to 0.64; P<0.001). Adverse events of grade 3 or higher occurred in 67.2% of the patients in the pembrolizumab-combination group and in 65.8% of those in the placebo-combination group.  Jodene Nam L. Efficacy of pemetrexed-based regimens in advanced non-small cell lung cancer patients with activating epidermal growth factor receptor mutations after tyrosine kinase inhibitor failure: a systematic review. Onco Targets Ther. 2018;11:2121-2129.   --The weighted median PFS, median OS, and ORR for patients treated with pem regimens were 5.09 months, 15.91 months, and 30.19%, respectively. Our systematic review results showed a favorable efficacy profile of pem regimens in NSCLC patients with EGFR mutation after EGFR-TKI failure.

## 2020-06-29 ENCOUNTER — Encounter: Payer: Self-pay | Admitting: *Deleted

## 2020-06-29 ENCOUNTER — Inpatient Hospital Stay: Payer: BC Managed Care – PPO | Attending: Hematology and Oncology | Admitting: Hematology and Oncology

## 2020-06-29 ENCOUNTER — Inpatient Hospital Stay: Payer: BC Managed Care – PPO

## 2020-06-29 ENCOUNTER — Encounter: Payer: Self-pay | Admitting: Hematology and Oncology

## 2020-06-29 ENCOUNTER — Other Ambulatory Visit: Payer: Self-pay

## 2020-06-29 VITALS — HR 95

## 2020-06-29 VITALS — BP 128/95 | HR 104 | Temp 98.2°F | Resp 17 | Ht 62.0 in | Wt 133.8 lb

## 2020-06-29 DIAGNOSIS — R112 Nausea with vomiting, unspecified: Secondary | ICD-10-CM | POA: Insufficient documentation

## 2020-06-29 DIAGNOSIS — C3490 Malignant neoplasm of unspecified part of unspecified bronchus or lung: Secondary | ICD-10-CM

## 2020-06-29 DIAGNOSIS — Z86718 Personal history of other venous thrombosis and embolism: Secondary | ICD-10-CM | POA: Insufficient documentation

## 2020-06-29 DIAGNOSIS — C7951 Secondary malignant neoplasm of bone: Secondary | ICD-10-CM | POA: Diagnosis not present

## 2020-06-29 DIAGNOSIS — C3431 Malignant neoplasm of lower lobe, right bronchus or lung: Secondary | ICD-10-CM | POA: Diagnosis not present

## 2020-06-29 DIAGNOSIS — Z79899 Other long term (current) drug therapy: Secondary | ICD-10-CM | POA: Diagnosis not present

## 2020-06-29 DIAGNOSIS — I313 Pericardial effusion (noninflammatory): Secondary | ICD-10-CM | POA: Diagnosis not present

## 2020-06-29 DIAGNOSIS — C3491 Malignant neoplasm of unspecified part of right bronchus or lung: Secondary | ICD-10-CM | POA: Diagnosis not present

## 2020-06-29 DIAGNOSIS — E876 Hypokalemia: Secondary | ICD-10-CM | POA: Diagnosis not present

## 2020-06-29 DIAGNOSIS — Z5112 Encounter for antineoplastic immunotherapy: Secondary | ICD-10-CM | POA: Diagnosis not present

## 2020-06-29 DIAGNOSIS — Z9049 Acquired absence of other specified parts of digestive tract: Secondary | ICD-10-CM | POA: Insufficient documentation

## 2020-06-29 DIAGNOSIS — Z923 Personal history of irradiation: Secondary | ICD-10-CM | POA: Diagnosis not present

## 2020-06-29 DIAGNOSIS — Z95828 Presence of other vascular implants and grafts: Secondary | ICD-10-CM

## 2020-06-29 DIAGNOSIS — Z5111 Encounter for antineoplastic chemotherapy: Secondary | ICD-10-CM | POA: Insufficient documentation

## 2020-06-29 DIAGNOSIS — C7931 Secondary malignant neoplasm of brain: Secondary | ICD-10-CM

## 2020-06-29 DIAGNOSIS — Z7901 Long term (current) use of anticoagulants: Secondary | ICD-10-CM | POA: Insufficient documentation

## 2020-06-29 DIAGNOSIS — R918 Other nonspecific abnormal finding of lung field: Secondary | ICD-10-CM

## 2020-06-29 LAB — CMP (CANCER CENTER ONLY)
ALT: 62 U/L — ABNORMAL HIGH (ref 0–44)
AST: 37 U/L (ref 15–41)
Albumin: 3.4 g/dL — ABNORMAL LOW (ref 3.5–5.0)
Alkaline Phosphatase: 69 U/L (ref 38–126)
Anion gap: 10 (ref 5–15)
BUN: 12 mg/dL (ref 8–23)
CO2: 22 mmol/L (ref 22–32)
Calcium: 9.3 mg/dL (ref 8.9–10.3)
Chloride: 108 mmol/L (ref 98–111)
Creatinine: 0.61 mg/dL (ref 0.44–1.00)
GFR, Estimated: >60 mL/min (ref >60)
Glucose, Bld: 89 mg/dL (ref 70–99)
Potassium: 4 mmol/L (ref 3.5–5.1)
Sodium: 140 mmol/L (ref 135–145)
Total Bilirubin: <0.2 mg/dL — ABNORMAL LOW (ref 0.3–1.2)
Total Protein: 6.3 g/dL — ABNORMAL LOW (ref 6.5–8.1)

## 2020-06-29 LAB — CBC WITH DIFFERENTIAL (CANCER CENTER ONLY)
Abs Immature Granulocytes: 0.01 10*3/uL (ref 0.00–0.07)
Basophils Absolute: 0 10*3/uL (ref 0.0–0.1)
Basophils Relative: 1 %
Eosinophils Absolute: 0.1 10*3/uL (ref 0.0–0.5)
Eosinophils Relative: 1 %
HCT: 31.4 % — ABNORMAL LOW (ref 36.0–46.0)
Hemoglobin: 10.8 g/dL — ABNORMAL LOW (ref 12.0–15.0)
Immature Granulocytes: 0 %
Lymphocytes Relative: 37 %
Lymphs Abs: 1.3 10*3/uL (ref 0.7–4.0)
MCH: 32.6 pg (ref 26.0–34.0)
MCHC: 34.4 g/dL (ref 30.0–36.0)
MCV: 94.9 fL (ref 80.0–100.0)
Monocytes Absolute: 0.6 10*3/uL (ref 0.1–1.0)
Monocytes Relative: 15 %
Neutro Abs: 1.6 10*3/uL — ABNORMAL LOW (ref 1.7–7.7)
Neutrophils Relative %: 46 %
Platelet Count: 260 10*3/uL (ref 150–400)
RBC: 3.31 MIL/uL — ABNORMAL LOW (ref 3.87–5.11)
RDW: 15.6 % — ABNORMAL HIGH (ref 11.5–15.5)
WBC Count: 3.6 10*3/uL — ABNORMAL LOW (ref 4.0–10.5)
nRBC: 0 % (ref 0.0–0.2)

## 2020-06-29 LAB — TSH: TSH: 1.12 u[IU]/mL (ref 0.308–3.960)

## 2020-06-29 MED ORDER — PROCHLORPERAZINE MALEATE 10 MG PO TABS
10.0000 mg | ORAL_TABLET | Freq: Once | ORAL | Status: AC
  Administered 2020-06-29: 10 mg via ORAL

## 2020-06-29 MED ORDER — PALONOSETRON HCL INJECTION 0.25 MG/5ML: 0.2500 mg | Freq: Once | INTRAVENOUS | Status: DC

## 2020-06-29 MED ORDER — SODIUM CHLORIDE 0.9 % IV SOLN
200.0000 mg | Freq: Once | INTRAVENOUS | Status: AC
  Administered 2020-06-29: 200 mg via INTRAVENOUS
  Filled 2020-06-29: qty 8

## 2020-06-29 MED ORDER — HEPARIN SOD (PORK) LOCK FLUSH 100 UNIT/ML IV SOLN
500.0000 [IU] | Freq: Once | INTRAVENOUS | Status: AC | PRN
  Administered 2020-06-29: 500 [IU]
  Filled 2020-06-29: qty 5

## 2020-06-29 MED ORDER — PALONOSETRON HCL INJECTION 0.25 MG/5ML
INTRAVENOUS | Status: AC
  Filled 2020-06-29: qty 5

## 2020-06-29 MED ORDER — SODIUM CHLORIDE 0.9 % IV SOLN
Freq: Once | INTRAVENOUS | Status: AC
  Filled 2020-06-29: qty 250

## 2020-06-29 MED ORDER — SODIUM CHLORIDE 0.9 % IV SOLN
500.0000 mg/m2 | Freq: Once | INTRAVENOUS | Status: AC
  Administered 2020-06-29: 800 mg via INTRAVENOUS
  Filled 2020-06-29: qty 12

## 2020-06-29 MED ORDER — SODIUM CHLORIDE 0.9 % IV SOLN: 150.0000 mg | Freq: Once | INTRAVENOUS | Status: DC

## 2020-06-29 MED ORDER — SODIUM CHLORIDE 0.9% FLUSH
10.0000 mL | INTRAVENOUS | Status: DC | PRN
  Administered 2020-06-29: 10 mL
  Filled 2020-06-29: qty 10

## 2020-06-29 MED ORDER — SODIUM CHLORIDE 0.9 % IV SOLN: 10.0000 mg | Freq: Once | INTRAVENOUS | Status: DC

## 2020-06-29 MED ORDER — PROCHLORPERAZINE MALEATE 10 MG PO TABS
ORAL_TABLET | ORAL | Status: AC
  Filled 2020-06-29: qty 1

## 2020-06-29 MED ORDER — SODIUM CHLORIDE 0.9% FLUSH
10.0000 mL | INTRAVENOUS | Status: DC | PRN
  Administered 2020-06-29: 10 mL via INTRAVENOUS
  Filled 2020-06-29: qty 10

## 2020-06-29 NOTE — Patient Instructions (Signed)
[facility] Discharge Instructions for Patients Receiving Chemotherapy  Today you received the following chemotherapy agents: Keytruda and Alimta  To help prevent nausea and vomiting after your treatment, we encourage you to take your nausea medication as directed.   If you develop nausea and vomiting that is not controlled by your nausea medication, call the clinic.   BELOW ARE SYMPTOMS THAT SHOULD BE REPORTED IMMEDIATELY:  *FEVER GREATER THAN 100.5 F  *CHILLS WITH OR WITHOUT FEVER  NAUSEA AND VOMITING THAT IS NOT CONTROLLED WITH YOUR NAUSEA MEDICATION  *UNUSUAL SHORTNESS OF BREATH  *UNUSUAL BRUISING OR BLEEDING  TENDERNESS IN MOUTH AND THROAT WITH OR WITHOUT PRESENCE OF ULCERS  *URINARY PROBLEMS  *BOWEL PROBLEMS  UNUSUAL RASH Items with * indicate a potential emergency and should be followed up as soon as possible.  Feel free to call the clinic should you have any questions or concerns. The clinic phone number is [phone].  Please show the [facility] at check-in to the Emergency Department and triage nurse.  Zoledronic Acid injection (Hypercalcemia, Oncology) What is this medicine? ZOLEDRONIC ACID (ZOE le dron ik AS id) lowers the amount of calcium loss from bone. It is used to treat too much calcium in your blood from cancer. It is also used to prevent complications of cancer that has spread to the bone. This medicine may be used for other purposes; ask your health care provider or pharmacist if you have questions. COMMON BRAND NAME(S): Zometa What should I tell my health care provider before I take this medicine? They need to know if you have any of these conditions:  aspirin-sensitive asthma  cancer, especially if you are receiving medicines used to treat cancer  dental disease or wear dentures  infection  kidney disease  receiving corticosteroids like dexamethasone or prednisone  an unusual or allergic reaction to zoledronic acid,  other medicines, foods, dyes, or preservatives  pregnant or trying to get pregnant  breast-feeding How should I use this medicine? This medicine is for infusion into a vein. It is given by a health care professional in a hospital or clinic setting. Talk to your pediatrician regarding the use of this medicine in children. Special care may be needed. Overdosage: If you think you have taken too much of this medicine contact a poison control center or emergency room at once. NOTE: This medicine is only for you. Do not share this medicine with others. What if I miss a dose? It is important not to miss your dose. Call your doctor or health care professional if you are unable to keep an appointment. What may interact with this medicine?  certain antibiotics given by injection  NSAIDs, medicines for pain and inflammation, like ibuprofen or naproxen  some diuretics like bumetanide, furosemide  teriparatide  thalidomide This list may not describe all possible interactions. Give your health care provider a list of all the medicines, herbs, non-prescription drugs, or dietary supplements you use. Also tell them if you smoke, drink alcohol, or use illegal drugs. Some items may interact with your medicine. What should I watch for while using this medicine? Visit your doctor or health care professional for regular checkups. It may be some time before you see the benefit from this medicine. Do not stop taking your medicine unless your doctor tells you to. Your doctor may order blood tests or other tests to see how you are doing. Women should inform their doctor if they wish to become pregnant or think they might  be pregnant. There is a potential for serious side effects to an unborn child. Talk to your health care professional or pharmacist for more information. You should make sure that you get enough calcium and vitamin D while you are taking this medicine. Discuss the foods you eat and the vitamins you  take with your health care professional. Some people who take this medicine have severe bone, joint, and/or muscle pain. This medicine may also increase your risk for jaw problems or a broken thigh bone. Tell your doctor right away if you have severe pain in your jaw, bones, joints, or muscles. Tell your doctor if you have any pain that does not go away or that gets worse. Tell your dentist and dental surgeon that you are taking this medicine. You should not have major dental surgery while on this medicine. See your dentist to have a dental exam and fix any dental problems before starting this medicine. Take good care of your teeth while on this medicine. Make sure you see your dentist for regular follow-up appointments. What side effects may I notice from receiving this medicine? Side effects that you should report to your doctor or health care professional as soon as possible:  allergic reactions like skin rash, itching or hives, swelling of the face, lips, or tongue  anxiety, confusion, or depression  breathing problems  changes in vision  eye pain  feeling faint or lightheaded, falls  jaw pain, especially after dental work  mouth sores  muscle cramps, stiffness, or weakness  redness, blistering, peeling or loosening of the skin, including inside the mouth  trouble passing urine or change in the amount of urine Side effects that usually do not require medical attention (report to your doctor or health care professional if they continue or are bothersome):  bone, joint, or muscle pain  constipation  diarrhea  fever  hair loss  irritation at site where injected  loss of appetite  nausea, vomiting  stomach upset  trouble sleeping  trouble swallowing  weak or tired This list may not describe all possible side effects. Call your doctor for medical advice about side effects. You may report side effects to FDA at 1-800-FDA-1088. Where should I keep my medicine? This  drug is given in a hospital or clinic and will not be stored at home. NOTE: This sheet is a summary. It may not cover all possible information. If you have questions about this medicine, talk to your doctor, pharmacist, or health care provider.  2020 [employer]vier/Gold Standard (2014-01-23 14:19:39)

## 2020-07-01 ENCOUNTER — Ambulatory Visit: Payer: BC Managed Care – PPO

## 2020-07-11 ENCOUNTER — Encounter: Payer: Self-pay | Admitting: Internal Medicine

## 2020-07-11 ENCOUNTER — Ambulatory Visit (INDEPENDENT_AMBULATORY_CARE_PROVIDER_SITE_OTHER): Payer: BC Managed Care – PPO | Admitting: Internal Medicine

## 2020-07-11 ENCOUNTER — Other Ambulatory Visit: Payer: Self-pay

## 2020-07-11 ENCOUNTER — Telehealth: Payer: Self-pay | Admitting: *Deleted

## 2020-07-11 ENCOUNTER — Telehealth: Payer: Self-pay | Admitting: Internal Medicine

## 2020-07-11 VITALS — HR 84 | Temp 101.2°F

## 2020-07-11 DIAGNOSIS — J029 Acute pharyngitis, unspecified: Secondary | ICD-10-CM | POA: Diagnosis not present

## 2020-07-11 DIAGNOSIS — J22 Unspecified acute lower respiratory infection: Secondary | ICD-10-CM | POA: Diagnosis not present

## 2020-07-11 DIAGNOSIS — R059 Cough, unspecified: Secondary | ICD-10-CM

## 2020-07-11 MED ORDER — CLARITHROMYCIN 500 MG PO TABS: 500.0000 mg | ORAL_TABLET | Freq: Two times a day (BID) | ORAL | 0 refills | Status: DC

## 2020-07-11 NOTE — Telephone Encounter (Signed)
Scheduled

## 2020-07-11 NOTE — Patient Instructions (Addendum)
Rest and drink plenty of fluids.  Take Biaxin 500 mg twice daily for 10 days.  Monitor symptoms.  Call if symptoms worsen.  Respiratory virus panel and COVID-19 test are pending

## 2020-07-11 NOTE — Progress Notes (Signed)
   Subjective:    Patient ID: Virginia [name], female    DOB: [birth year], 64 y.o.   MRN: [id number]  HPI 64 year old Female being treated for metastatic non-small cell lung carcinoma by Dr. Lorenso Courier presents with sore throat, cough, and respiratory congestion. Rapid strep screen was negative today. Covid-19 and Respiratory virus panels obtained by nasal swab.   CT chest from October 14 reviewed.Is on Xtampza ER for pain and prn oxycodone. Anticoagulation therapy recently discontinued because of concern for CNS bleed on last MRI.  Labs reviewed from Sept 30 are stable including CBC showing mild anemia and neutropenia.  Review of Systems     Objective:   Physical Exam  T 101.2 degrees. Looks fatigued and pale. Sounds a bit hoarse. No tachypnea. Pulse ox 98% Pharynx is red without exudate. TMS clear. Neck supple. Chest clear without rales or wheezing.       Assessment & Plan:  Lower respiratory illness with fever.  Respiratory virus panel and COVID-19 test pending.  Is not tachypnea.  Vital signs are stable.  Blood pressure not checked as she is seen in the parking lot today.  Her chest is clear on my exam.  Her pharynx is injected and rapid strep screen is negative.Patient has tolerated Biaxin in the past. Called in Biaxin 500 mg twice daily with food to [facility], Call if symptoms worsen.

## 2020-07-11 NOTE — Telephone Encounter (Signed)
Needs car visit 

## 2020-07-11 NOTE — Telephone Encounter (Signed)
Virginia [name] [phone]  Virginia [name] called to say that Virginia [name] is coughing up yellowish stuff, has head congestion, sore throat, not eating or drinking very much, no fever, No COVID-19 exposure that they know of. Has had COVID-19 vaccines and Flu shot. Would like to come in to be seen.

## 2020-07-11 NOTE — Telephone Encounter (Signed)
Received call from pt's husband,Virginia [name]. He states that Virginia [name] has an URI-productive cough, sore throat, congestion. Denies fevers. Advised that Dr. Lorenso Courier is not in the office today. Advised that [name] should call [name]'s PCP and be seen there.  [name] voiced understanding. He stated he would call Dr. Verlene Mayer office

## 2020-07-12 LAB — SARS-COV-2 RNA,(COVID-19) QUALITATIVE NAAT: SARS CoV2 RNA: NOT DETECTED

## 2020-07-13 LAB — RESPIRATORY VIRUS PANEL

## 2020-07-13 LAB — TIQ-NTM

## 2020-07-18 ENCOUNTER — Telehealth: Payer: Self-pay | Admitting: *Deleted

## 2020-07-18 NOTE — Telephone Encounter (Signed)
TCT patient and/or her husband. Call made to see how [name] is doing with her respiratory infection.  No answer but was able to leave vm message for pt to return call at her convenience.

## 2020-07-20 ENCOUNTER — Inpatient Hospital Stay: Payer: BC Managed Care – PPO

## 2020-07-20 ENCOUNTER — Other Ambulatory Visit: Payer: Self-pay

## 2020-07-20 ENCOUNTER — Inpatient Hospital Stay: Payer: BC Managed Care – PPO | Attending: Hematology and Oncology

## 2020-07-20 ENCOUNTER — Inpatient Hospital Stay: Payer: BC Managed Care – PPO | Admitting: Hematology and Oncology

## 2020-07-20 ENCOUNTER — Other Ambulatory Visit: Payer: Self-pay | Admitting: Hematology and Oncology

## 2020-07-20 VITALS — BP 129/95 | HR 98 | Temp 98.1°F | Resp 18 | Wt 136.1 lb

## 2020-07-20 DIAGNOSIS — R5383 Other fatigue: Secondary | ICD-10-CM | POA: Insufficient documentation

## 2020-07-20 DIAGNOSIS — I313 Pericardial effusion (noninflammatory): Secondary | ICD-10-CM | POA: Diagnosis not present

## 2020-07-20 DIAGNOSIS — Z79899 Other long term (current) drug therapy: Secondary | ICD-10-CM | POA: Diagnosis not present

## 2020-07-20 DIAGNOSIS — E876 Hypokalemia: Secondary | ICD-10-CM | POA: Insufficient documentation

## 2020-07-20 DIAGNOSIS — C7951 Secondary malignant neoplasm of bone: Secondary | ICD-10-CM

## 2020-07-20 DIAGNOSIS — C7931 Secondary malignant neoplasm of brain: Secondary | ICD-10-CM | POA: Diagnosis not present

## 2020-07-20 DIAGNOSIS — Z7901 Long term (current) use of anticoagulants: Secondary | ICD-10-CM | POA: Insufficient documentation

## 2020-07-20 DIAGNOSIS — C3431 Malignant neoplasm of lower lobe, right bronchus or lung: Secondary | ICD-10-CM

## 2020-07-20 DIAGNOSIS — C3490 Malignant neoplasm of unspecified part of unspecified bronchus or lung: Secondary | ICD-10-CM

## 2020-07-20 DIAGNOSIS — Z5111 Encounter for antineoplastic chemotherapy: Secondary | ICD-10-CM | POA: Diagnosis not present

## 2020-07-20 DIAGNOSIS — Z9049 Acquired absence of other specified parts of digestive tract: Secondary | ICD-10-CM | POA: Diagnosis not present

## 2020-07-20 DIAGNOSIS — R112 Nausea with vomiting, unspecified: Secondary | ICD-10-CM | POA: Insufficient documentation

## 2020-07-20 DIAGNOSIS — Z5112 Encounter for antineoplastic immunotherapy: Secondary | ICD-10-CM | POA: Insufficient documentation

## 2020-07-20 DIAGNOSIS — Z86718 Personal history of other venous thrombosis and embolism: Secondary | ICD-10-CM | POA: Insufficient documentation

## 2020-07-20 DIAGNOSIS — Z923 Personal history of irradiation: Secondary | ICD-10-CM | POA: Insufficient documentation

## 2020-07-20 DIAGNOSIS — J069 Acute upper respiratory infection, unspecified: Secondary | ICD-10-CM | POA: Insufficient documentation

## 2020-07-20 DIAGNOSIS — I700 Atherosclerosis of aorta: Secondary | ICD-10-CM | POA: Insufficient documentation

## 2020-07-20 DIAGNOSIS — R918 Other nonspecific abnormal finding of lung field: Secondary | ICD-10-CM

## 2020-07-20 LAB — CMP (CANCER CENTER ONLY)
ALT: 64 U/L — ABNORMAL HIGH (ref 0–44)
AST: 46 U/L — ABNORMAL HIGH (ref 15–41)
Albumin: 3.2 g/dL — ABNORMAL LOW (ref 3.5–5.0)
Alkaline Phosphatase: 69 U/L (ref 38–126)
Anion gap: 7 (ref 5–15)
BUN: 12 mg/dL (ref 8–23)
CO2: 28 mmol/L (ref 22–32)
Calcium: 8.9 mg/dL (ref 8.9–10.3)
Chloride: 107 mmol/L (ref 98–111)
Creatinine: 0.66 mg/dL (ref 0.44–1.00)
GFR, Estimated: >60 mL/min (ref >60)
Glucose, Bld: 94 mg/dL (ref 70–99)
Potassium: 4 mmol/L (ref 3.5–5.1)
Sodium: 142 mmol/L (ref 135–145)
Total Bilirubin: 0.3 mg/dL (ref 0.3–1.2)
Total Protein: 6.2 g/dL — ABNORMAL LOW (ref 6.5–8.1)

## 2020-07-20 LAB — CBC WITH DIFFERENTIAL (CANCER CENTER ONLY)
Abs Immature Granulocytes: 0.05 10*3/uL (ref 0.00–0.07)
Basophils Absolute: 0 10*3/uL (ref 0.0–0.1)
Basophils Relative: 1 %
Eosinophils Absolute: 0.1 10*3/uL (ref 0.0–0.5)
Eosinophils Relative: 2 %
HCT: 30.1 % — ABNORMAL LOW (ref 36.0–46.0)
Hemoglobin: 10.3 g/dL — ABNORMAL LOW (ref 12.0–15.0)
Immature Granulocytes: 1 %
Lymphocytes Relative: 34 %
Lymphs Abs: 1.6 10*3/uL (ref 0.7–4.0)
MCH: 33.1 pg (ref 26.0–34.0)
MCHC: 34.2 g/dL (ref 30.0–36.0)
MCV: 96.8 fL (ref 80.0–100.0)
Monocytes Absolute: 0.8 10*3/uL (ref 0.1–1.0)
Monocytes Relative: 17 %
Neutro Abs: 2.2 10*3/uL (ref 1.7–7.7)
Neutrophils Relative %: 45 %
Platelet Count: 315 10*3/uL (ref 150–400)
RBC: 3.11 MIL/uL — ABNORMAL LOW (ref 3.87–5.11)
RDW: 14.9 % (ref 11.5–15.5)
WBC Count: 4.9 10*3/uL (ref 4.0–10.5)
nRBC: 0 % (ref 0.0–0.2)

## 2020-07-20 LAB — TSH: TSH: 1.175 u[IU]/mL (ref 0.308–3.960)

## 2020-07-20 MED ORDER — PROCHLORPERAZINE MALEATE 10 MG PO TABS
ORAL_TABLET | ORAL | Status: AC
  Filled 2020-07-20: qty 1

## 2020-07-20 MED ORDER — SODIUM CHLORIDE 0.9% FLUSH
10.0000 mL | INTRAVENOUS | Status: DC | PRN
  Administered 2020-07-20: 10 mL
  Filled 2020-07-20: qty 10

## 2020-07-20 MED ORDER — HEPARIN SOD (PORK) LOCK FLUSH 100 UNIT/ML IV SOLN
500.0000 [IU] | Freq: Once | INTRAVENOUS | Status: AC | PRN
  Administered 2020-07-20: 500 [IU]
  Filled 2020-07-20: qty 5

## 2020-07-20 MED ORDER — SODIUM CHLORIDE 0.9 % IV SOLN
200.0000 mg | Freq: Once | INTRAVENOUS | Status: AC
  Administered 2020-07-20: 200 mg via INTRAVENOUS
  Filled 2020-07-20: qty 8

## 2020-07-20 MED ORDER — SODIUM CHLORIDE 0.9 % IV SOLN
Freq: Once | INTRAVENOUS | Status: AC
  Filled 2020-07-20: qty 250

## 2020-07-20 MED ORDER — PROCHLORPERAZINE MALEATE 10 MG PO TABS
10.0000 mg | ORAL_TABLET | Freq: Four times a day (QID) | ORAL | Status: DC | PRN
  Administered 2020-07-20: 10 mg via ORAL

## 2020-07-20 MED ORDER — XTAMPZA ER 13.5 MG PO C12A
EXTENDED_RELEASE_CAPSULE | ORAL | 0 refills | Status: DC
Start: 2020-07-20 — End: 2020-08-19

## 2020-07-20 MED ORDER — SODIUM CHLORIDE 0.9 % IV SOLN
500.0000 mg/m2 | Freq: Once | INTRAVENOUS | Status: AC
  Administered 2020-07-20: 800 mg via INTRAVENOUS
  Filled 2020-07-20: qty 20

## 2020-07-20 MED ORDER — OXYCODONE HCL 5 MG PO TABS: 5.0000 mg | ORAL_TABLET | ORAL | 0 refills | Status: DC | PRN

## 2020-07-20 MED FILL — oxyCODONE HCL 5 MG TABS: 5 | 8 days supply | Qty: 90 | Fill #0

## 2020-07-20 NOTE — Progress Notes (Signed)
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 CT of the head showed showed concerning for metastatic disease involving the left cerebellum and right occipital lobe.  2) 09/30/2019: MRI brain confirms mass within the left cerebellum measures 1.6 x 1.3 x 1.5 cm as well as at least 8 metastatic lesions within the supratentorial and infratentorial brain as outlined 3) 10/01/2019: PET CT scan revealed hypermetabolic infrahilar right lower lobe mass with hypermetabolic nodal metastases in the right hilum, mediastinum, right supraclavicular region, left axilla and lower left neck. 4) 10/08/2019: US guided biopsy of the lymph nodes confirms poorly differentiated non-small cell carcinoma. Immunohistochemistry confirms adenocarcinoma of lung primary. PD-L1 and NGS later revealed a MET Exon 14 mutation and TPS of 90%.  5) 10/12/2019: Establish care with Dr. Lorenso Courier  6) 11/04/2019: Day 1 of capmatinib $RemoveBefor'400mg'UgSPRNnDnquV$  BID 7) 11/09/2019: presented to Rad/Onc with a DVT in LLE. Seen in symptom management clinic and started on apixaban.  8) 12/29/2019: CT C/A/P showed interval decrease in size of mass involving the superior segment of right lower lobe and reduction in mediastinal and left supraclavicular adenopathy though some small spinal lesions were noted in the interim between the two sets of scans 9) 01/25/2020: temporarily stopped capmatinib due to symptoms of nausea/fatigue. Restarted therapy on 01/30/2020 after brief chemo holiday.  10) 03/22/2020: CT C/A/P reveals a mixed picture, with new lung nodule and  increased lymphadenopathy, however response in bone lesions. D/c capmatinib therapy 11) 04/08/2020: start of Carbo/Pem/Pem for 2nd line treatment. Cycle 1 Day 1   12) 04/29/2020: Cycle 2 Day 1 Carbo/Pem/Pem  13) 05/20/2020: Cycle 3 Day 1 Carbo/Pem/Pem  14) 06/09/2020: Cycle 4 Day 1 Carbo/Pem/Pem  15) 06/29/2020: Cycle 5 Day 1 maintenance Pem/Pem  16) 07/20/2020: Cycle 6 Day 1 maintenance Pem/Pem   Interval History:  Virginia [name] 64 y.o. female with medical history significant for metastatic adenocarcinoma of the lung presents for a follow up visit. The patient's last visit was on 06/29/2020 prior to the start of Cycle 5 Day 1 of Pem/Pem. In the interim since the last visit she has had no new symptoms. Today is Cycle 6 Day 1.   On exam today Virginia [name] is accompanied by her husband.  She reports that she had increased fatigue following her last cycle of chemotherapy.  She reports that she tends to be most tired on days 3-4, however in the last cycle it persisted into day 5 and 6.  She notes that after that initial bout of fatigue she been feeling quite well.  She denies having any issues with nausea, vomiting, or diarrhea.  Her Xtampza ER and approximately 2 oxycodones a day have been helping to control her pain.  Interestingly she does still have pain in her head at the site of the concussion, but does not have frank headaches.  She endorses that her quality of life is excellent and she is excited to be going to [place] between now and her next cycle of chemotherapy.  She did have a brief viral illness where she felt "stuffed up" with nasal congestion and cough.  She notes that this resolved with  the clarithromycin antibiotic.  She does not have any residual symptoms from this infection.  She denies having any fevers, chills, sweats, nausea, running or diarrhea.  A full 10 point ROS is below.   MEDICAL HISTORY:  Past Medical History:  Diagnosis Date  . Anemia   . Anxiety   . Concussion  09/28/2019  . DVT (deep venous thrombosis) (HCC) 2021   L leg  . Dyspnea   . GERD (gastroesophageal reflux disease)   . Hypercholesterolemia    per pt, she does not have elevated lipids  . Hypertension   . met lung ca dx'd 09/2019   mets to spine, hip and brain  . PONV (postoperative nausea and vomiting)   . Tobacco abuse     SURGICAL HISTORY: Past Surgical History:  Procedure Laterality Date  . ABDOMINAL HYSTERECTOMY     partial/ left ovaries  . CHOLECYSTECTOMY    . DILATION AND CURETTAGE OF UTERUS    . IR IMAGING GUIDED PORT INSERTION  10/23/2019  . KYPHOPLASTY N/A 03/15/2020   Procedure: Thoracic Eight KYPHOPLASTY;  Surgeon: Maeola Harman, MD;  Location: [facility] OR;  Service: Neurosurgery;  Laterality: N/A;  prone   . LIPOMA EXCISION  2018   removed under left breast and right thigh.  . TUBAL LIGATION      ALLERGIES:  has No Known Allergies.  MEDICATIONS:  Current Outpatient Medications  Medication Sig Dispense Refill  . albuterol (VENTOLIN HFA) 108 (90 Base) MCG/ACT inhaler Inhale 2 puffs into the lungs every 6 (six) hours as needed for wheezing or shortness of breath. 6.7 g 11  . ALPRAZolam (XANAX) 0.25 MG tablet Take 1 tablet (0.25 mg total) by mouth 2 (two) times daily as needed. for anxiety (Patient taking differently: Take 0.25 mg by mouth 2 (two) times daily as needed for anxiety. ) 60 tablet 5  . apixaban (ELIQUIS) 5 MG TABS tablet Take 1 tablet (5 mg total) by mouth 2 (two) times daily. (Patient not taking: Reported on 05/20/2020) 60 tablet 1  . clarithromycin (BIAXIN) 500 MG tablet Take 1 tablet (500 mg total) by mouth 2 (two) times daily. 20 tablet 0  . Cyanocobalamin (VITAMIN B12 PO) Take 2 tablets by mouth daily. Gummies    . folic acid (FOLVITE) 1 MG tablet Take 1 tablet (1 mg total) by mouth daily. 90 tablet 1  . furosemide (LASIX) 20 MG tablet 20 mg once daily for 3 days, then stop. May repeat if edema recurs. (Patient not taking: Reported on 05/20/2020) 30 tablet 1   . glycerin adult 2 g suppository Place 1 suppository rectally as needed for constipation. (Patient not taking: Reported on 05/20/2020) 12 suppository 0  . lidocaine-prilocaine (EMLA) cream Apply 1 application topically as needed. Apply to port as needed. (Patient taking differently: Apply 1 application topically as needed (Apply to port as needed.). ) 30 g 0  . methocarbamol (ROBAXIN) 500 MG tablet Take 500 mg by mouth 3 (three) times daily as needed. (Patient not taking: Reported on 05/20/2020)    . Multiple Vitamins-Minerals (AIRBORNE PO) Take 1 tablet by mouth daily.     Marland Kitchen ofloxacin (OCUFLOX) 0.3 % ophthalmic solution 2 drops each eye 4 times a day for 5 days (Patient not taking: Reported on 03/09/2020) 5 mL 1  . OLANZapine (ZYPREXA) 2.5 MG tablet Take 1 tablet (2.5 mg total) by mouth at bedtime. 90 tablet 1  . oxyCODONE (OXY IR/ROXICODONE) 5 MG immediate release tablet Take 1-2 tablets (5-10 mg total) by mouth every  4 (four) hours as needed for severe pain. 90 tablet 0  . pantoprazole (PROTONIX) 40 MG tablet TAKE 1 TABLET(40 MG) BY MOUTH DAILY (Patient taking differently: Take 40 mg by mouth daily. ) 90 tablet 3  . polyethylene glycol (MIRALAX / GLYCOLAX) 17 g packet Take 17 g by mouth 2 (two) times daily.     . prochlorperazine (COMPAZINE) 10 MG tablet TAKE 1 TABLET BY MOUTH EVERY 6 HOURS AS NEEDED FOR NAUSEA OR VOMITING (Patient not taking: Reported on 02/25/2020) 30 tablet 0  . sennosides-docusate sodium (SENOKOT-S) 8.6-50 MG tablet Take 2-3 tablets by mouth See admin instructions. 2 in the morning and 3 at night. Adjust as needed (Patient not taking: Reported on 05/20/2020)    . sucralfate (CARAFATE) 1 g tablet Dissolve 1 tablet in 10 mL H20 and swallow 30 min prior to meals and bedtime. (Patient not taking: Reported on 02/25/2020) 50 tablet 3  . Vitamin D3 (VITAMIN D) 25 MCG tablet Take 2,000 Units by mouth daily.    Marland Kitchen XTAMPZA ER 13.5 MG C12A TAKE 1 CAPSULE BY MOUTH EVERY 12 HOURS 60 capsule 0    No current facility-administered medications for this visit.    REVIEW OF SYSTEMS:   Constitutional: ( - ) fevers, ( - )  chills , ( - ) night sweats Eyes: ( - ) blurriness of vision, ( - ) double vision, ( - ) watery eyes Ears, nose, mouth, throat, and face: ( - ) mucositis, ( - ) sore throat Respiratory: ( - ) cough, ( - ) dyspnea, ( - ) wheezes Cardiovascular: ( - ) palpitation, ( - ) chest discomfort, ( - ) lower extremity swelling Gastrointestinal:  ( - ) nausea, ( - ) heartburn, ( - ) change in bowel habits Skin: ( - ) abnormal skin rashes Lymphatics: ( - ) new lymphadenopathy, ( - ) easy bruising Neurological: ( - ) numbness, ( - ) tingling, ( - ) new weaknesses Behavioral/Psych: ( - ) mood change, ( - ) new changes  All other systems were reviewed with the patient and are negative.  PHYSICAL EXAMINATION: ECOG PERFORMANCE STATUS: 1 - Symptomatic but completely ambulatory  Vitals:   07/20/20 1119  BP: (!) 129/95  Pulse: 98  Resp: 18  Temp: 98.1 F (36.7 C)  SpO2: 98%   Filed Weights   07/20/20 1119  Weight: 136 lb 1.6 oz (61.7 kg)    GENERAL: well appearing middle aged Caucasian female in NAD  SKIN: skin color, texture, turgor are normal, no rashes or significant lesions EAR: left ear tympanic membrane mildly erythematous. No discharge or clear signs of bacterial infection.  EYES: conjunctiva are pink and non-injected, sclera clear LUNGS: clear to auscultation and percussion with normal breathing effort HEART: regular rate & rhythm and no murmurs and bilateral + trace pitting lower extremity edema Musculoskeletal: no cyanosis of digits and no clubbing PSYCH: alert & oriented x 3, fluent speech NEURO: no focal motor/sensory deficits   LABORATORY DATA:  I have reviewed the data as listed CBC Latest Ref Rng & Units 07/20/2020 06/29/2020 06/09/2020  WBC 4.0 - 10.5 K/uL 4.9 3.6(L) 3.8(L)  Hemoglobin 12.0 - 15.0 g/dL 10.3(L) 10.8(L) 10.5(L)  Hematocrit 36 - 46 %  30.1(L) 31.4(L) 30.5(L)  Platelets 150 - 400 K/uL 315 260 235    CMP Latest Ref Rng & Units 06/29/2020 06/09/2020 05/20/2020  Glucose 70 - 99 mg/dL 89 86 101(H)  BUN 8 - 23 mg/dL 12 11 7(L)  Creatinine 0.44 -  1.00 mg/dL 0.61 0.57 0.59  Sodium 135 - 145 mmol/L 140 140 141  Potassium 3.5 - 5.1 mmol/L 4.0 4.0 3.9  Chloride 98 - 111 mmol/L 108 107 110  CO2 22 - 32 mmol/L $RemoveB'22 28 23  'FrNXJFDh$ Calcium 8.9 - 10.3 mg/dL 9.3 9.0 8.8(L)  Total Protein 6.5 - 8.1 g/dL 6.3(L) 6.1(L) 6.4(L)  Total Bilirubin 0.3 - 1.2 mg/dL <0.2(L) 0.2(L) 0.3  Alkaline Phos 38 - 126 U/L 69 74 93  AST 15 - 41 U/L 37 25 40  ALT 0 - 44 U/L 62(H) 46(H) 60(H)     RADIOGRAPHIC STUDIES: I personally have viewed the radiographic studies below: CT C/A/P showed increased lymphadenopathy of supraclavicular node with new lung nodule. Bone lesions responding. Evidence of T8 kyphoplasty. CT CHEST ABDOMEN PELVIS W CONTRAST  Result Date: 06/23/2020 CLINICAL DATA:  Non-small cell lung cancer restaging. Ongoing chemotherapy. EXAM: CT CHEST, ABDOMEN, AND PELVIS WITH CONTRAST TECHNIQUE: Multidetector CT imaging of the chest, abdomen and pelvis was performed following the standard protocol during bolus administration of intravenous contrast. CONTRAST:  13mL OMNIPAQUE IOHEXOL 300 MG/ML  SOLN COMPARISON:  03/25/2020 FINDINGS: CT CHEST FINDINGS Cardiovascular: Right Port-A-Cath tip: SVC. Coronary, aortic arch, and branch vessel atherosclerotic vascular disease. Mild mitral valve calcification. Small pericardial effusion, mildly reduced from prior. Borderline cardiomegaly. Mediastinum/Nodes: Indistinctness of tissue planes in the lower paratracheal and subcarinal region likely related to prior therapy. Left supraclavicular lymph node 0.4 cm in short axis on image 6 of series 2, previously 1.4 cm, substantially improved. Lungs/Pleura: Post therapy related findings with increased volume loss medially in the right lower lobe and in the perihilar regions.  Scattered bilateral ground-glass density pulmonary nodules are not appreciably changed. 3 by 4 mm right apical pulmonary nodule is stable on image 27 of series 4. There is some airway plugging in the right lower lobe. The nodular lesion in the vicinity of the airway plugging in the right lower lobe is overall reduced in prominence, currently 0.9 cm in short axis and previously 1.3 cm in short axis. Musculoskeletal: Stable compression fracture with vertebral augmentation at the T8 level. Small sclerotic lesions at other vertebral levels including T10 and T7 are unchanged. CT ABDOMEN PELVIS FINDINGS Hepatobiliary: Cholecystectomy. Common bile duct 0.9 cm in diameter, stable and likely a physiologic response to cholecystectomy. No significant parenchymal lesion is identified in the liver. Pancreas: Unremarkable Spleen: Unremarkable Adrenals/Urinary Tract: Unremarkable Stomach/Bowel: Prominent stool throughout the colon favors constipation. Vascular/Lymphatic: Aortoiliac atherosclerotic vascular disease. Reproductive: Uterus absent.  Adnexa unremarkable. Other: No supplemental non-categorized findings. Musculoskeletal: Stable sclerotic bony lesions in the lumbar spine and bony pelvis, most notably in the right iliac bone posteriorly on image 91 of series 2 and also along the right iliac crest. IMPRESSION: 1. Post therapy related findings in the lungs with increased volume loss medially in the right lower lobe and in the perihilar regions. The nodular lesion in the right lower lobe in the vicinity of the airway plugging is overall reduced in prominence. The left supraclavicular node is prominently reduced in size. No overt mediastinal adenopathy is currently noted. 2. Stable sclerotic bony metastatic lesions. 3. Other imaging findings of potential clinical significance: Coronary atherosclerosis. Small pericardial effusion, mildly reduced from prior. Prominent stool throughout the colon favors constipation. Stable  compression fracture with vertebral augmentation at T8. Mild mitral valve calcification. 4. Aortic atherosclerosis. Aortic Atherosclerosis (ICD10-I70.0). Electronically Signed   By: Van Clines M.D.   On: 06/23/2020 13:29    ASSESSMENT & PLAN Virginia Nugent  [name] 64 y.o. female with medical history significant for metastatic adenocarcinoma of the lung presents for a follow up visit. 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   Today Mrs. Cianci reports she is currently stable at her baseline.  Her pain is currently under good control and she is not having any new symptoms at this time.  She had a brief upper respiratory infection which is subsequently cleared.  Overall she is willing and able to proceed with chemotherapy had no reservations about moving forward.  She is also not demonstrating any symptoms of recurrent VTE or signs or symptoms concerning for worsening brain bleed.  Previously we discussed Carboplatin/Pembrolizumab/Pemetrexed and the expected side effect from these medications.  We discussed the possible side effects of immunotherapy including colitis, hepatitis, dermatitis, and pneumonitis.  Additionally we discussed the side effects of carboplatin which include suppression of blood counts, nephrotoxicity, and nausea.  Additionally we discussed pemetrexed which can also have effects on counts and would require supplementation with vitamin Q25 and folic acid.  She voiced her understanding of these side effects and treatment plans moving forward.  I also noted that we would be able to drop the chemotherapy agents that she had intolerance to continue with pembrolizumab alone given her high TPS score.  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 --port in place, patient completed chemotherapy education.  --plan for repeat CT C/A/P in Jan 2022 (3 months from last scan in Oct 2021) --supportive therapy as listed below.  --RTC q 3 weeks for continued monitoring/chemotherapy treatments.    #Nausea/Vomiting -- provided patient with Zofran 4-8mg  q8h PRN and compazine 10mg  q6H for breakthrough PRN. These have not been required since olanzapine was added --continue olanzpaine 2.5mg  PO QHS to help with nausea. Marked improvement since the addition of this medication on 01/29/2020.   --continue to monitor   #Hypokalemia, stable --likely 2/2 to decreased PO intake, hypovolemia, and BP medications --given her decreased BP would recommend holding losartan and hydrochlorothiazide --continue to monitor  #Pain Control, improving --continue oxycodone 5mg  q4H PRN. Can increase dose to 10mg  q6H if pain is severe. She is taking approximately 1-2 pills per day.  --continue Xtampza for long acting support.  --continue to take with Senokot to prevent opioid induced constipation. 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  --nausea as above   #Headaches, resolved --appreciate the assistance of Dr. 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 --radiation therapy to the brain completed on 11/16/2019.  --MRI brain on 04/29/2020 showed evidence of small brain bleed. Eliquis therapy was discontinued.  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    No orders of the defined types were placed in this encounter.  All questions were answered. The patient knows to call the clinic with any problems, questions or concerns.  A total of more than 30 minutes  were spent on this encounter and over half of that time was spent on counseling and  coordination of care as outlined above.   Ledell Peoples, MD Department of Hematology/Oncology [place] at [facility] Phone: [phone] Pager: [phone] Email: [email or web address]  07/20/2020 11:43 AM   Literature Support:  Lorenza Chick, Rodrguez-Abreu D, Duffy Rhody, Felip E, Marshall F, Domine M, [place], Hochmair MJ, [place], [place], Bischoff HG, [place], Grossi F, [place], Reck M, [place], [place], [place], Rubio-Viqueira B, Novello S, Kurata T, Gray JE, Vida J, Wei Z, Yang J, Raftopoulos H, [place], [place] [employer]; [id number] Investigators. Pembrolizumab plus Chemotherapy in Metastatic Non-Small-Cell Lung Cancer. [place] Med. 2018 May 31;378(22):2078-2092.  --Median progression-free survival was 8.8 months (95% CI, 7.6 to 9.2) in the pembrolizumab-combination group and 4.9 months (95% CI, 4.7 to 5.5) in the placebo-combination group (hazard ratio for disease progression or death, 0.52; 95% CI, 0.43 to 0.64; P<0.001). Adverse events of grade 3 or higher occurred in 67.2% of the patients in the pembrolizumab-combination group and in 65.8% of those in the placebo-combination group.  Jodene Nam L. Efficacy of pemetrexed-based regimens in advanced non-small cell lung cancer patients with activating epidermal growth factor receptor mutations after tyrosine kinase inhibitor failure: a systematic review. Onco Targets Ther. 2018;11:2121-2129.   --The weighted median PFS, median OS, and ORR for patients treated with pem regimens were 5.09 months, 15.91 months, and 30.19%, respectively. Our systematic review results showed a favorable efficacy profile of pem regimens in NSCLC patients with EGFR mutation after EGFR-TKI failure.

## 2020-07-21 ENCOUNTER — Other Ambulatory Visit: Payer: Self-pay | Admitting: Radiation Therapy

## 2020-07-21 ENCOUNTER — Ambulatory Visit: Payer: BC Managed Care – PPO

## 2020-07-21 ENCOUNTER — Other Ambulatory Visit: Payer: BC Managed Care – PPO

## 2020-07-21 ENCOUNTER — Ambulatory Visit: Payer: BC Managed Care – PPO | Admitting: Hematology and Oncology

## 2020-07-21 DIAGNOSIS — C7931 Secondary malignant neoplasm of brain: Secondary | ICD-10-CM

## 2020-07-22 ENCOUNTER — Other Ambulatory Visit: Payer: BC Managed Care – PPO

## 2020-07-22 ENCOUNTER — Ambulatory Visit: Payer: BC Managed Care – PPO

## 2020-07-22 ENCOUNTER — Ambulatory Visit: Payer: BC Managed Care – PPO | Admitting: Hematology and Oncology

## 2020-07-22 MED FILL — XTAMPZA ER 13.5 MG C12A: 13.5 | 30 days supply | Qty: 60 | Fill #0

## 2020-07-25 ENCOUNTER — Other Ambulatory Visit: Payer: Self-pay | Admitting: Internal Medicine

## 2020-07-25 DIAGNOSIS — Z1152 Encounter for screening for COVID-19: Secondary | ICD-10-CM | POA: Diagnosis not present

## 2020-08-11 ENCOUNTER — Other Ambulatory Visit: Payer: BC Managed Care – PPO

## 2020-08-12 ENCOUNTER — Inpatient Hospital Stay: Payer: BC Managed Care – PPO | Attending: Hematology and Oncology | Admitting: Hematology and Oncology

## 2020-08-12 ENCOUNTER — Inpatient Hospital Stay: Payer: BC Managed Care – PPO

## 2020-08-12 ENCOUNTER — Other Ambulatory Visit: Payer: Self-pay

## 2020-08-12 VITALS — BP 138/94 | HR 87 | Temp 97.8°F | Resp 18 | Ht 62.0 in | Wt 139.8 lb

## 2020-08-12 DIAGNOSIS — C3431 Malignant neoplasm of lower lobe, right bronchus or lung: Secondary | ICD-10-CM | POA: Insufficient documentation

## 2020-08-12 DIAGNOSIS — C7951 Secondary malignant neoplasm of bone: Secondary | ICD-10-CM

## 2020-08-12 DIAGNOSIS — Z9049 Acquired absence of other specified parts of digestive tract: Secondary | ICD-10-CM | POA: Diagnosis not present

## 2020-08-12 DIAGNOSIS — Z814 Family history of other substance abuse and dependence: Secondary | ICD-10-CM | POA: Diagnosis not present

## 2020-08-12 DIAGNOSIS — Z923 Personal history of irradiation: Secondary | ICD-10-CM | POA: Diagnosis not present

## 2020-08-12 DIAGNOSIS — Z8249 Family history of ischemic heart disease and other diseases of the circulatory system: Secondary | ICD-10-CM | POA: Diagnosis not present

## 2020-08-12 DIAGNOSIS — E876 Hypokalemia: Secondary | ICD-10-CM | POA: Diagnosis not present

## 2020-08-12 DIAGNOSIS — Z86718 Personal history of other venous thrombosis and embolism: Secondary | ICD-10-CM | POA: Diagnosis not present

## 2020-08-12 DIAGNOSIS — R5383 Other fatigue: Secondary | ICD-10-CM | POA: Diagnosis not present

## 2020-08-12 DIAGNOSIS — C7931 Secondary malignant neoplasm of brain: Secondary | ICD-10-CM | POA: Insufficient documentation

## 2020-08-12 DIAGNOSIS — Z809 Family history of malignant neoplasm, unspecified: Secondary | ICD-10-CM | POA: Insufficient documentation

## 2020-08-12 DIAGNOSIS — R112 Nausea with vomiting, unspecified: Secondary | ICD-10-CM | POA: Diagnosis not present

## 2020-08-12 DIAGNOSIS — R918 Other nonspecific abnormal finding of lung field: Secondary | ICD-10-CM

## 2020-08-12 DIAGNOSIS — Z5111 Encounter for antineoplastic chemotherapy: Secondary | ICD-10-CM | POA: Diagnosis not present

## 2020-08-12 DIAGNOSIS — Z79899 Other long term (current) drug therapy: Secondary | ICD-10-CM | POA: Insufficient documentation

## 2020-08-12 DIAGNOSIS — Z87891 Personal history of nicotine dependence: Secondary | ICD-10-CM | POA: Insufficient documentation

## 2020-08-12 DIAGNOSIS — C3491 Malignant neoplasm of unspecified part of right bronchus or lung: Secondary | ICD-10-CM

## 2020-08-12 DIAGNOSIS — C3490 Malignant neoplasm of unspecified part of unspecified bronchus or lung: Secondary | ICD-10-CM

## 2020-08-12 DIAGNOSIS — R55 Syncope and collapse: Secondary | ICD-10-CM | POA: Diagnosis not present

## 2020-08-12 DIAGNOSIS — C778 Secondary and unspecified malignant neoplasm of lymph nodes of multiple regions: Secondary | ICD-10-CM | POA: Insufficient documentation

## 2020-08-12 DIAGNOSIS — Z5112 Encounter for antineoplastic immunotherapy: Secondary | ICD-10-CM | POA: Insufficient documentation

## 2020-08-12 DIAGNOSIS — I6782 Cerebral ischemia: Secondary | ICD-10-CM | POA: Diagnosis not present

## 2020-08-12 DIAGNOSIS — Z95828 Presence of other vascular implants and grafts: Secondary | ICD-10-CM

## 2020-08-12 LAB — CBC WITH DIFFERENTIAL (CANCER CENTER ONLY)
Abs Immature Granulocytes: 0.02 10*3/uL (ref 0.00–0.07)
Basophils Absolute: 0 10*3/uL (ref 0.0–0.1)
Basophils Relative: 1 %
Eosinophils Absolute: 0.1 10*3/uL (ref 0.0–0.5)
Eosinophils Relative: 2 %
HCT: 31.1 % — ABNORMAL LOW (ref 36.0–46.0)
Hemoglobin: 10.7 g/dL — ABNORMAL LOW (ref 12.0–15.0)
Immature Granulocytes: 0 %
Lymphocytes Relative: 27 %
Lymphs Abs: 1.2 10*3/uL (ref 0.7–4.0)
MCH: 34 pg (ref 26.0–34.0)
MCHC: 34.4 g/dL (ref 30.0–36.0)
MCV: 98.7 fL (ref 80.0–100.0)
Monocytes Absolute: 0.6 10*3/uL (ref 0.1–1.0)
Monocytes Relative: 14 %
Neutro Abs: 2.6 10*3/uL (ref 1.7–7.7)
Neutrophils Relative %: 56 %
Platelet Count: 259 10*3/uL (ref 150–400)
RBC: 3.15 MIL/uL — ABNORMAL LOW (ref 3.87–5.11)
RDW: 14.6 % (ref 11.5–15.5)
WBC Count: 4.6 10*3/uL (ref 4.0–10.5)
nRBC: 0 % (ref 0.0–0.2)

## 2020-08-12 LAB — CMP (CANCER CENTER ONLY)
ALT: 62 U/L — ABNORMAL HIGH (ref 0–44)
AST: 46 U/L — ABNORMAL HIGH (ref 15–41)
Albumin: 3.2 g/dL — ABNORMAL LOW (ref 3.5–5.0)
Alkaline Phosphatase: 75 U/L (ref 38–126)
Anion gap: 8 (ref 5–15)
BUN: 7 mg/dL — ABNORMAL LOW (ref 8–23)
CO2: 26 mmol/L (ref 22–32)
Calcium: 8.8 mg/dL — ABNORMAL LOW (ref 8.9–10.3)
Chloride: 107 mmol/L (ref 98–111)
Creatinine: 0.63 mg/dL (ref 0.44–1.00)
GFR, Estimated: >60 mL/min (ref >60)
Glucose, Bld: 129 mg/dL — ABNORMAL HIGH (ref 70–99)
Potassium: 3.7 mmol/L (ref 3.5–5.1)
Sodium: 141 mmol/L (ref 135–145)
Total Bilirubin: 0.3 mg/dL (ref 0.3–1.2)
Total Protein: 6.2 g/dL — ABNORMAL LOW (ref 6.5–8.1)

## 2020-08-12 LAB — TSH: TSH: 0.675 u[IU]/mL (ref 0.308–3.960)

## 2020-08-12 MED ORDER — ALTEPLASE 2 MG IJ SOLR
INTRAMUSCULAR | Status: AC
  Filled 2020-08-12: qty 2

## 2020-08-12 MED ORDER — PROCHLORPERAZINE MALEATE 10 MG PO TABS
ORAL_TABLET | ORAL | Status: AC
  Filled 2020-08-12: qty 1

## 2020-08-12 MED ORDER — HEPARIN SOD (PORK) LOCK FLUSH 100 UNIT/ML IV SOLN
500.0000 [IU] | Freq: Once | INTRAVENOUS | Status: AC | PRN
  Administered 2020-08-12: 500 [IU]
  Filled 2020-08-12: qty 5

## 2020-08-12 MED ORDER — CYANOCOBALAMIN 1000 MCG/ML IJ SOLN
1000.0000 ug | Freq: Once | INTRAMUSCULAR | Status: AC
  Administered 2020-08-12: 1000 ug via INTRAMUSCULAR

## 2020-08-12 MED ORDER — ZOLEDRONIC ACID 4 MG/100ML IV SOLN: 4.0000 mg | Freq: Once | INTRAVENOUS | Status: DC

## 2020-08-12 MED ORDER — SODIUM CHLORIDE 0.9% FLUSH
3.0000 mL | Freq: Once | INTRAVENOUS | Status: AC | PRN
  Administered 2020-08-12: 10 mL
  Filled 2020-08-12: qty 10

## 2020-08-12 MED ORDER — CYANOCOBALAMIN 1000 MCG/ML IJ SOLN
INTRAMUSCULAR | Status: AC
  Filled 2020-08-12: qty 1

## 2020-08-12 MED ORDER — SODIUM CHLORIDE 0.9 % IV SOLN
200.0000 mg | Freq: Once | INTRAVENOUS | Status: AC
  Administered 2020-08-12: 200 mg via INTRAVENOUS
  Filled 2020-08-12: qty 8

## 2020-08-12 MED ORDER — SODIUM CHLORIDE 0.9% FLUSH
10.0000 mL | INTRAVENOUS | Status: DC | PRN
  Administered 2020-08-12: 10 mL
  Filled 2020-08-12: qty 10

## 2020-08-12 MED ORDER — PROCHLORPERAZINE MALEATE 10 MG PO TABS
10.0000 mg | ORAL_TABLET | Freq: Once | ORAL | Status: AC
  Administered 2020-08-12: 10 mg via ORAL

## 2020-08-12 MED ORDER — ALTEPLASE 2 MG IJ SOLR
2.0000 mg | Freq: Once | INTRAMUSCULAR | Status: AC | PRN
  Administered 2020-08-12: 2 mg
  Filled 2020-08-12: qty 2

## 2020-08-12 MED ORDER — SODIUM CHLORIDE 0.9 % IV SOLN
500.0000 mg/m2 | Freq: Once | INTRAVENOUS | Status: AC
  Administered 2020-08-12: 800 mg via INTRAVENOUS
  Filled 2020-08-12: qty 20

## 2020-08-12 MED ORDER — SODIUM CHLORIDE 0.9 % IV SOLN
Freq: Once | INTRAVENOUS | Status: AC
  Filled 2020-08-12: qty 250

## 2020-08-12 NOTE — Patient Instructions (Signed)

## 2020-08-12 NOTE — Progress Notes (Signed)
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 CT of the head showed showed concerning for metastatic disease involving the left cerebellum and right occipital lobe.  2) 09/30/2019: MRI brain confirms mass within the left cerebellum measures 1.6 x 1.3 x 1.5 cm as well as at least 8 metastatic lesions within the supratentorial and infratentorial brain as outlined 3) 10/01/2019: PET CT scan revealed hypermetabolic infrahilar right lower lobe mass with hypermetabolic nodal metastases in the right hilum, mediastinum, right supraclavicular region, left axilla and lower left neck. 4) 10/08/2019: US guided biopsy of the lymph nodes confirms poorly differentiated non-small cell carcinoma. Immunohistochemistry confirms adenocarcinoma of lung primary. PD-L1 and NGS later revealed a MET Exon 14 mutation and TPS of 90%.  5) 10/12/2019: Establish care with Dr. Lorenso Courier  6) 11/04/2019: Day 1 of capmatinib $RemoveBefor'400mg'NfNIFGxxOuUe$  BID 7) 11/09/2019: presented to Rad/Onc with a DVT in LLE. Seen in symptom management clinic and started on apixaban.  8) 12/29/2019: CT C/A/P showed interval decrease in size of mass involving the superior segment of right lower lobe and reduction in mediastinal and left supraclavicular adenopathy though some small spinal lesions were noted in the interim between the two sets of scans 9) 01/25/2020: temporarily stopped capmatinib due to symptoms of nausea/fatigue. Restarted therapy on 01/30/2020 after brief chemo holiday.  10) 03/22/2020: CT C/A/P reveals a mixed picture, with new lung nodule and  increased lymphadenopathy, however response in bone lesions. D/c capmatinib therapy 11) 04/08/2020: start of Carbo/Pem/Pem for 2nd line treatment. Cycle 1 Day 1   12) 04/29/2020: Cycle 2 Day 1 Carbo/Pem/Pem  13) 05/20/2020: Cycle 3 Day 1 Carbo/Pem/Pem  14) 06/09/2020: Cycle 4 Day 1 Carbo/Pem/Pem  15) 06/29/2020: Cycle 5 Day 1 maintenance Pem/Pem  16) 07/20/2020: Cycle 6 Day 1 maintenance Pem/Pem   Interval History:  Virginia [name] 64 y.o. female with medical history significant for metastatic adenocarcinoma of the lung presents for a follow up visit. The patient's last visit was on 06/29/2020 prior to the start of Cycle 5 Day 1 of Pem/Pem. In the interim since the last visit she has had no new symptoms. Today is Cycle 6 Day 1.   On exam today [name] is accompanied by her husband.  They just had a trip to [place] which they reported went quite well.  They enjoyed good food and overall she tolerated the trip quite well with only one night where she had to go to bed early due to fatigue.  She developed some redness from sunburn and some swelling of her lower extremities which she reported "everyone on the trip had due to seafood".  She notes otherwise she has been quite well with no nausea, vomiting, or diarrhea.  She denies any chest pain or shortness of breath.  Her weight has been stable.  A full 10 point ROS is listed below.   MEDICAL HISTORY:  Past Medical History:  Diagnosis Date  . Anemia   . Anxiety   . Concussion 09/28/2019  . DVT (deep venous thrombosis) ([place]) 2021   L leg  . Dyspnea   . GERD (gastroesophageal reflux disease)   . Hypercholesterolemia  per pt, she does not have elevated lipids  . Hypertension   . met lung ca dx'd 09/2019   mets to spine, hip and brain  . PONV (postoperative nausea and vomiting)   . Tobacco abuse     SURGICAL HISTORY: Past Surgical History:  Procedure Laterality Date  . ABDOMINAL HYSTERECTOMY     partial/ left ovaries  .  CHOLECYSTECTOMY    . DILATION AND CURETTAGE OF UTERUS    . IR IMAGING GUIDED PORT INSERTION  10/23/2019  . KYPHOPLASTY N/A 03/15/2020   Procedure: Thoracic Eight KYPHOPLASTY;  Surgeon: Erline Levine, MD;  Location: [place];  Service: Neurosurgery;  Laterality: N/A;  prone   . LIPOMA EXCISION  2018   removed under left breast and right thigh.  . TUBAL LIGATION      ALLERGIES:  has No Known Allergies.  MEDICATIONS:  Current Outpatient Medications  Medication Sig Dispense Refill  . albuterol (VENTOLIN HFA) 108 (90 Base) MCG/ACT inhaler Inhale 2 puffs into the lungs every 6 (six) hours as needed for wheezing or shortness of breath. 6.7 g 11  . ALPRAZolam (XANAX) 0.25 MG tablet Take 1 tablet (0.25 mg total) by mouth 2 (two) times daily as needed. for anxiety (Patient taking differently: Take 0.25 mg by mouth 2 (two) times daily as needed for anxiety. ) 60 tablet 5  . clarithromycin (BIAXIN) 500 MG tablet Take 1 tablet (500 mg total) by mouth 2 (two) times daily. 20 tablet 0  . Cyanocobalamin (VITAMIN B12 PO) Take 2 tablets by mouth daily. Gummies    . folic acid (FOLVITE) 1 MG tablet Take 1 tablet (1 mg total) by mouth daily. 90 tablet 1  . Multiple Vitamins-Minerals (AIRBORNE PO) Take 1 tablet by mouth daily.     Marland Kitchen ofloxacin (OCUFLOX) 0.3 % ophthalmic solution 2 drops each eye 4 times a day for 5 days 5 mL 1  . OLANZapine (ZYPREXA) 2.5 MG tablet Take 1 tablet (2.5 mg total) by mouth at bedtime. 90 tablet 1  . oxyCODONE (OXY IR/ROXICODONE) 5 MG immediate release tablet Take 1-2 tablets (5-10 mg total) by mouth every 4 (four) hours as needed for severe pain. 90 tablet 0  . pantoprazole (PROTONIX) 40 MG tablet TAKE ONE TABLET BY MOUTH DAILY 90 tablet 3  . polyethylene glycol (MIRALAX / GLYCOLAX) 17 g packet Take 17 g by mouth 2 (two) times daily.     . Vitamin D3 (VITAMIN D) 25 MCG tablet Take 2,000 Units by mouth daily.    Marland Kitchen XTAMPZA ER 13.5 MG C12A TAKE 1 CAPSULE BY MOUTH EVERY 12 HOURS 60 capsule 0   . furosemide (LASIX) 20 MG tablet 20 mg once daily for 3 days, then stop. May repeat if edema recurs. (Patient not taking: Reported on 05/20/2020) 30 tablet 1  . glycerin adult 2 g suppository Place 1 suppository rectally as needed for constipation. (Patient not taking: Reported on 05/20/2020) 12 suppository 0  . lidocaine-prilocaine (EMLA) cream Apply 1 application topically as needed. Apply to port as needed. (Patient not taking: Reported on 08/12/2020) 30 g 0  . methocarbamol (ROBAXIN) 500 MG tablet Take 500 mg by mouth 3 (three) times daily as needed. (Patient not taking: Reported on 05/20/2020)    . prochlorperazine (COMPAZINE) 10 MG tablet TAKE 1 TABLET BY MOUTH EVERY 6 HOURS AS NEEDED FOR NAUSEA OR VOMITING (Patient not taking: Reported on 02/25/2020) 30 tablet 0  . sennosides-docusate sodium (SENOKOT-S) 8.6-50 MG tablet Take 2-3 tablets by mouth See admin instructions.  2 in the morning and 3 at night. Adjust as needed (Patient not taking: Reported on 05/20/2020)    . sucralfate (CARAFATE) 1 g tablet Dissolve 1 tablet in 10 mL H20 and swallow 30 min prior to meals and bedtime. (Patient not taking: Reported on 02/25/2020) 50 tablet 3   No current facility-administered medications for this visit.   Facility-Administered Medications Ordered in Other Visits  Medication Dose Route Frequency Provider Last Rate Last Admin  . sodium chloride flush (NS) 0.9 % injection 10 mL  10 mL Intracatheter PRN Orson Slick, MD   10 mL at 08/12/20 1532    REVIEW OF SYSTEMS:   Constitutional: ( - ) fevers, ( - )  chills , ( - ) night sweats Eyes: ( - ) blurriness of vision, ( - ) double vision, ( - ) watery eyes Ears, nose, mouth, throat, and face: ( - ) mucositis, ( - ) sore throat Respiratory: ( - ) cough, ( - ) dyspnea, ( - ) wheezes Cardiovascular: ( - ) palpitation, ( - ) chest discomfort, ( - ) lower extremity swelling Gastrointestinal:  ( - ) nausea, ( - ) heartburn, ( - ) change in bowel habits Skin:  ( - ) abnormal skin rashes Lymphatics: ( - ) new lymphadenopathy, ( - ) easy bruising Neurological: ( - ) numbness, ( - ) tingling, ( - ) new weaknesses Behavioral/Psych: ( - ) mood change, ( - ) new changes  All other systems were reviewed with the patient and are negative.  PHYSICAL EXAMINATION: ECOG PERFORMANCE STATUS: 1 - Symptomatic but completely ambulatory  Vitals:   08/12/20 1133  BP: (!) 138/94  Pulse: 87  Resp: 18  Temp: 97.8 F (36.6 C)  SpO2: 98%   Filed Weights   08/12/20 1133  Weight: 139 lb 12.8 oz (63.4 kg)    GENERAL: well appearing middle aged Caucasian female in NAD  SKIN: skin color, texture, turgor are normal, no rashes or significant lesions EAR: left ear tympanic membrane mildly erythematous. No discharge or clear signs of bacterial infection.  EYES: conjunctiva are pink and non-injected, sclera clear LUNGS: clear to auscultation and percussion with normal breathing effort HEART: regular rate & rhythm and no murmurs and bilateral + trace pitting lower extremity edema Musculoskeletal: no cyanosis of digits and no clubbing PSYCH: alert & oriented x 3, fluent speech NEURO: no focal motor/sensory deficits   LABORATORY DATA:  I have reviewed the data as listed CBC Latest Ref Rng & Units 08/12/2020 07/20/2020 06/29/2020  WBC 4.0 - 10.5 K/uL 4.6 4.9 3.6(L)  Hemoglobin 12.0 - 15.0 g/dL 10.7(L) 10.3(L) 10.8(L)  Hematocrit 36 - 46 % 31.1(L) 30.1(L) 31.4(L)  Platelets 150 - 400 K/uL 259 315 260    CMP Latest Ref Rng & Units 08/12/2020 07/20/2020 06/29/2020  Glucose 70 - 99 mg/dL 129(H) 94 89  BUN 8 - 23 mg/dL 7(L) 12 12  Creatinine 0.44 - 1.00 mg/dL 0.63 0.66 0.61  Sodium 135 - 145 mmol/L 141 142 140  Potassium 3.5 - 5.1 mmol/L 3.7 4.0 4.0  Chloride 98 - 111 mmol/L 107 107 108  CO2 22 - 32 mmol/L $RemoveB'26 28 22  'xHkBWykC$ Calcium 8.9 - 10.3 mg/dL 8.8(L) 8.9 9.3  Total Protein 6.5 - 8.1 g/dL 6.2(L) 6.2(L) 6.3(L)  Total Bilirubin 0.3 - 1.2 mg/dL 0.3 0.3 <0.2(L)   Alkaline Phos 38 - 126 U/L 75 69 69  AST 15 - 41 U/L 46(H) 46(H) 37  ALT 0 - 44 U/L  62(H) 64(H) 62(H)     RADIOGRAPHIC STUDIES: I personally have viewed the radiographic studies below: CT C/A/P showed increased lymphadenopathy of supraclavicular node with new lung nodule. Bone lesions responding. Evidence of T8 kyphoplasty. No results found.  ASSESSMENT & PLAN Virginia [name] 64 y.o. female with medical history significant for metastatic adenocarcinoma of the lung presents for a follow up visit. 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   Today Mrs. Rex reports she is currently stable at her baseline.  Her pain continues to be under good control and she is not having any new symptoms at this time.  She traveled to [place] and had a great trip.  Overall she is willing and able to proceed with chemotherapy had no reservations about moving forward.  She is also not demonstrating any symptoms of recurrent VTE or signs or symptoms concerning for worsening brain bleed.  Previously we discussed Carboplatin/Pembrolizumab/Pemetrexed and the expected side effect from these medications.  We discussed the possible side effects of immunotherapy including colitis, hepatitis, dermatitis, and pneumonitis.  Additionally we discussed the side effects of carboplatin which include suppression of blood counts, nephrotoxicity, and nausea.  Additionally we discussed pemetrexed which can also have effects on counts and would require supplementation with vitamin Z61 and folic acid.  She voiced her understanding of these side effects and treatment plans moving forward.  I also noted that we would be able to drop the chemotherapy agents that she had intolerance to continue with pembrolizumab alone given her high TPS score.  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 --port in place, patient completed chemotherapy education.  --plan for repeat CT C/A/P in Jan 2022 (3 months from last scan in Oct 2021) --supportive therapy as listed below.  --RTC q 3 weeks for continued monitoring/chemotherapy treatments.    #Nausea/Vomiting -- provided patient with Zofran 4-8mg  q8h PRN and compazine 10mg  q6H for breakthrough PRN. These have not been required since olanzapine was added --continue olanzpaine 2.5mg  PO QHS to help with nausea. Marked improvement since the addition of this medication on 01/29/2020.   --continue to monitor   #Hypokalemia, stable --likely 2/2 to decreased PO intake, hypovolemia, and BP medications --given her decreased BP would recommend holding losartan and hydrochlorothiazide --continue to monitor  #Pain Control, improving --continue oxycodone 5mg  q4H PRN. Can increase dose to 10mg  q6H if pain is severe. She is taking approximately 1-2 pills per day.  --continue Xtampza for long acting support.  --continue to take with Senokot to prevent opioid induced constipation. 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  --nausea as above   #Headaches, resolved --appreciate the assistance of Dr. 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 --radiation therapy to the brain completed on 11/16/2019.  --MRI brain on 04/29/2020 showed evidence of small brain bleed. Eliquis therapy was discontinued.  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    Orders Placed This Encounter  Procedures  . CT CHEST  ABDOMEN PELVIS W CONTRAST    Standing Status:   Future     Standing Expiration Date:   08/12/2021    Order Specific Question:   If indicated for the ordered procedure, I authorize the administration of contrast media per Radiology protocol    Answer:   Yes    Order Specific Question:   Preferred imaging location?    Answer:   [facility]    Order Specific Question:   Is Oral Contrast requested for this exam?    Answer:   Yes, Per Radiology protocol    Order Specific Question:   Reason for Exam (SYMPTOM  OR DIAGNOSIS REQUIRED)    Answer:   Lung cancer on treatment, assess for progression   All questions were answered. The patient knows to call the clinic with any problems, questions or concerns.  A total of more than 30 minutes were spent on this encounter and over half of that time was spent on counseling and coordination of care as outlined above.   Ledell Peoples, MD Department of Hematology/Oncology [place] [facility] Phone: [phone] Pager: [phone] Email: [email or web address]  08/12/2020 4:20 PM   Literature Support:  Lorenza Chick, Rodrguez-Abreu D, Duffy Rhody, Felip E, [place] F, Domine M, [place], Hochmair MJ, [place], [place], Bischoff HG, [place], Grossi F, [place], Reck M, [place], [place], [place], Rubio-Viqueira B, Novello S, Kurata T, Gray JE, Vida J, Wei Z, Yang J, Raftopoulos H, [place], [place] [employer]; [id number] Investigators. Pembrolizumab plus Chemotherapy in Metastatic Non-Small-Cell Lung Cancer. [facility] [place] Med. 2018 May 31;378(22):2078-2092.  --Median progression-free survival was 8.8 months (95% CI, 7.6 to 9.2) in the pembrolizumab-combination group and 4.9 months (95% CI, 4.7 to 5.5) in the placebo-combination group (hazard ratio for disease progression or death, 0.52; 95% CI, 0.43 to 0.64; P<0.001). Adverse events of grade 3 or higher occurred in 67.2% of the patients in the pembrolizumab-combination group and in 65.8% of those in the placebo-combination  group.  Jodene Nam L. Efficacy of pemetrexed-based regimens in advanced non-small cell lung cancer patients with activating epidermal growth factor receptor mutations after tyrosine kinase inhibitor failure: a systematic review. Onco Targets Ther. 2018;11:2121-2129.   --The weighted median PFS, median OS, and ORR for patients treated with pem regimens were 5.09 months, 15.91 months, and 30.19%, respectively. Our systematic review results showed a favorable efficacy profile of pem regimens in NSCLC patients with EGFR mutation after EGFR-TKI failure.

## 2020-08-12 NOTE — Patient Instructions (Signed)
[facility] Discharge Instructions for Patients Receiving Chemotherapy  Today you received the following immunotherapy agent: Pembrolizumab (Keytruda) and chemotherapy agent: Pemetrexed (Alimta)  To help prevent nausea and vomiting after your treatment, we encourage you to take your nausea medication as directed by your MD.   If you develop nausea and vomiting that is not controlled by your nausea medication, call the clinic.   BELOW ARE SYMPTOMS THAT SHOULD BE REPORTED IMMEDIATELY:  *FEVER GREATER THAN 100.5 F  *CHILLS WITH OR WITHOUT FEVER  NAUSEA AND VOMITING THAT IS NOT CONTROLLED WITH YOUR NAUSEA MEDICATION  *UNUSUAL SHORTNESS OF BREATH  *UNUSUAL BRUISING OR BLEEDING  TENDERNESS IN MOUTH AND THROAT WITH OR WITHOUT PRESENCE OF ULCERS  *URINARY PROBLEMS  *BOWEL PROBLEMS  UNUSUAL RASH Items with * indicate a potential emergency and should be followed up as soon as possible.  Feel free to call the clinic should you have any questions or concerns. The clinic phone number is [phone].  Please show the [facility] at check-in to the Emergency Department and triage nurse.

## 2020-08-12 NOTE — Progress Notes (Signed)
Pt. stable for discharge. Left via ambulation, no respiratory distress noted.

## 2020-08-17 ENCOUNTER — Telehealth: Payer: Self-pay | Admitting: *Deleted

## 2020-08-17 ENCOUNTER — Telehealth: Payer: Self-pay | Admitting: Hematology and Oncology

## 2020-08-17 ENCOUNTER — Other Ambulatory Visit: Payer: Self-pay | Admitting: Hematology and Oncology

## 2020-08-17 MED ORDER — OLANZAPINE 2.5 MG PO TABS
2.5000 mg | ORAL_TABLET | Freq: Every day | ORAL | 1 refills | Status: DC
Start: 2020-08-17 — End: 2020-08-17

## 2020-08-17 MED FILL — OLANZapine 2.5 MG TABS: 2.5 | 90 days supply | Qty: 90 | Fill #0

## 2020-08-17 NOTE — Telephone Encounter (Signed)
Scheduled per los. Called and spoke with patients husband. Confirmed appt

## 2020-08-17 NOTE — Telephone Encounter (Signed)
Received call from pt's husband, [name]. He is requesting refill of [name]'s  Oxycodone (last filled on 07/20/20 for # 67) and Xtampza (last filled on 07/20/20 for # 68).  Dr. Lorenso Courier made aware.

## 2020-08-18 ENCOUNTER — Ambulatory Visit
Admission: RE | Admit: 2020-08-18 | Discharge: 2020-08-18 | Disposition: A | Discharge status: Home / Self Care | Payer: BC Managed Care – PPO | Source: Ambulatory Visit | Attending: Radiation Oncology | Admitting: Radiation Oncology

## 2020-08-18 ENCOUNTER — Other Ambulatory Visit: Payer: Self-pay | Admitting: Hematology and Oncology

## 2020-08-18 ENCOUNTER — Other Ambulatory Visit: Payer: Self-pay

## 2020-08-18 DIAGNOSIS — Z85118 Personal history of other malignant neoplasm of bronchus and lung: Secondary | ICD-10-CM | POA: Diagnosis not present

## 2020-08-18 DIAGNOSIS — D181 Lymphangioma, any site: Secondary | ICD-10-CM | POA: Diagnosis not present

## 2020-08-18 DIAGNOSIS — G9389 Other specified disorders of brain: Secondary | ICD-10-CM | POA: Diagnosis not present

## 2020-08-18 DIAGNOSIS — C7949 Secondary malignant neoplasm of other parts of nervous system: Secondary | ICD-10-CM

## 2020-08-18 DIAGNOSIS — C7931 Secondary malignant neoplasm of brain: Secondary | ICD-10-CM

## 2020-08-18 DIAGNOSIS — G936 Cerebral edema: Secondary | ICD-10-CM | POA: Diagnosis not present

## 2020-08-18 MED ORDER — GADOBENATE DIMEGLUMINE 529 MG/ML IV SOLN
12.0000 mL | Freq: Once | INTRAVENOUS | Status: AC | PRN
  Administered 2020-08-18: 12 mL via INTRAVENOUS

## 2020-08-19 ENCOUNTER — Other Ambulatory Visit: Payer: Self-pay | Admitting: Hematology and Oncology

## 2020-08-19 ENCOUNTER — Telehealth: Payer: Self-pay | Admitting: Radiation Therapy

## 2020-08-19 MED FILL — oxyCODONE HCL 5 MG TABS: 5 | 8 days supply | Qty: 90 | Fill #0

## 2020-08-19 MED FILL — XTAMPZA ER 13.5 MG C12A: 13.5 | 30 days supply | Qty: 60 | Fill #0

## 2020-08-19 NOTE — Telephone Encounter (Signed)
I called and spoke with Virginia [name] in response to her husband's concern about her recent MRI report. She understands that there are no new areas concerning for treatment at this time and that we will discuss her case during conference Monday morning in regards to the inflammation noted in the MRI report. Virginia [name] is scheduled to see Dr. Vertell Limber Monday 12/13, he will share with her the conference discussion and recommendations for treatment of the inflammation.   Mont Dutton R.T.(R)(T) Radiation Special Procedures Navigator

## 2020-08-22 ENCOUNTER — Other Ambulatory Visit: Payer: Self-pay | Admitting: Radiation Oncology

## 2020-08-22 ENCOUNTER — Inpatient Hospital Stay: Payer: BC Managed Care – PPO

## 2020-08-22 DIAGNOSIS — Z6825 Body mass index (BMI) 25.0-25.9, adult: Secondary | ICD-10-CM | POA: Diagnosis not present

## 2020-08-22 DIAGNOSIS — C7931 Secondary malignant neoplasm of brain: Secondary | ICD-10-CM

## 2020-08-22 DIAGNOSIS — C7951 Secondary malignant neoplasm of bone: Secondary | ICD-10-CM

## 2020-08-22 DIAGNOSIS — I10 Essential (primary) hypertension: Secondary | ICD-10-CM | POA: Diagnosis not present

## 2020-08-22 MED ORDER — PENTOXIFYLLINE ER 400 MG PO TBCR: EXTENDED_RELEASE_TABLET | ORAL | 4 refills | Status: DC

## 2020-08-22 MED ORDER — VITAMIN E 180 MG (400 UNIT) PO CAPS: ORAL_CAPSULE | ORAL | 4 refills | Status: DC

## 2020-09-01 ENCOUNTER — Inpatient Hospital Stay (HOSPITAL_BASED_OUTPATIENT_CLINIC_OR_DEPARTMENT_OTHER): Payer: BC Managed Care – PPO | Admitting: Hematology and Oncology

## 2020-09-01 ENCOUNTER — Inpatient Hospital Stay: Payer: BC Managed Care – PPO

## 2020-09-01 ENCOUNTER — Other Ambulatory Visit: Payer: Self-pay | Admitting: Hematology and Oncology

## 2020-09-01 ENCOUNTER — Telehealth: Payer: Self-pay | Admitting: Internal Medicine

## 2020-09-01 ENCOUNTER — Other Ambulatory Visit: Payer: Self-pay

## 2020-09-01 ENCOUNTER — Encounter: Payer: Self-pay | Admitting: Hematology and Oncology

## 2020-09-01 VITALS — BP 139/101 | HR 85 | Temp 96.6°F | Resp 18 | Ht 62.0 in | Wt 137.2 lb

## 2020-09-01 VITALS — BP 154/107

## 2020-09-01 DIAGNOSIS — C7931 Secondary malignant neoplasm of brain: Secondary | ICD-10-CM | POA: Diagnosis not present

## 2020-09-01 DIAGNOSIS — C3431 Malignant neoplasm of lower lobe, right bronchus or lung: Secondary | ICD-10-CM | POA: Diagnosis not present

## 2020-09-01 DIAGNOSIS — E876 Hypokalemia: Secondary | ICD-10-CM | POA: Diagnosis not present

## 2020-09-01 DIAGNOSIS — Z79899 Other long term (current) drug therapy: Secondary | ICD-10-CM | POA: Diagnosis not present

## 2020-09-01 DIAGNOSIS — R918 Other nonspecific abnormal finding of lung field: Secondary | ICD-10-CM

## 2020-09-01 DIAGNOSIS — C3490 Malignant neoplasm of unspecified part of unspecified bronchus or lung: Secondary | ICD-10-CM

## 2020-09-01 DIAGNOSIS — Z9049 Acquired absence of other specified parts of digestive tract: Secondary | ICD-10-CM | POA: Diagnosis not present

## 2020-09-01 DIAGNOSIS — R55 Syncope and collapse: Secondary | ICD-10-CM | POA: Diagnosis not present

## 2020-09-01 DIAGNOSIS — Z86718 Personal history of other venous thrombosis and embolism: Secondary | ICD-10-CM | POA: Diagnosis not present

## 2020-09-01 DIAGNOSIS — R5383 Other fatigue: Secondary | ICD-10-CM | POA: Diagnosis not present

## 2020-09-01 DIAGNOSIS — Z95828 Presence of other vascular implants and grafts: Secondary | ICD-10-CM

## 2020-09-01 DIAGNOSIS — C3491 Malignant neoplasm of unspecified part of right bronchus or lung: Secondary | ICD-10-CM

## 2020-09-01 DIAGNOSIS — I6782 Cerebral ischemia: Secondary | ICD-10-CM | POA: Diagnosis not present

## 2020-09-01 DIAGNOSIS — Z5111 Encounter for antineoplastic chemotherapy: Secondary | ICD-10-CM | POA: Diagnosis not present

## 2020-09-01 DIAGNOSIS — C7951 Secondary malignant neoplasm of bone: Secondary | ICD-10-CM

## 2020-09-01 DIAGNOSIS — Z87891 Personal history of nicotine dependence: Secondary | ICD-10-CM | POA: Diagnosis not present

## 2020-09-01 DIAGNOSIS — R112 Nausea with vomiting, unspecified: Secondary | ICD-10-CM | POA: Diagnosis not present

## 2020-09-01 DIAGNOSIS — Z923 Personal history of irradiation: Secondary | ICD-10-CM | POA: Diagnosis not present

## 2020-09-01 DIAGNOSIS — C778 Secondary and unspecified malignant neoplasm of lymph nodes of multiple regions: Secondary | ICD-10-CM | POA: Diagnosis not present

## 2020-09-01 DIAGNOSIS — Z5112 Encounter for antineoplastic immunotherapy: Secondary | ICD-10-CM | POA: Diagnosis not present

## 2020-09-01 LAB — CBC WITH DIFFERENTIAL (CANCER CENTER ONLY)
Abs Immature Granulocytes: 0.02 10*3/uL (ref 0.00–0.07)
Basophils Absolute: 0 10*3/uL (ref 0.0–0.1)
Basophils Relative: 1 %
Eosinophils Absolute: 0.1 10*3/uL (ref 0.0–0.5)
Eosinophils Relative: 2 %
HCT: 34.4 % — ABNORMAL LOW (ref 36.0–46.0)
Hemoglobin: 11.7 g/dL — ABNORMAL LOW (ref 12.0–15.0)
Immature Granulocytes: 0 %
Lymphocytes Relative: 27 %
Lymphs Abs: 1.6 10*3/uL (ref 0.7–4.0)
MCH: 33.9 pg (ref 26.0–34.0)
MCHC: 34 g/dL (ref 30.0–36.0)
MCV: 99.7 fL (ref 80.0–100.0)
Monocytes Absolute: 0.6 10*3/uL (ref 0.1–1.0)
Monocytes Relative: 10 %
Neutro Abs: 3.7 10*3/uL (ref 1.7–7.7)
Neutrophils Relative %: 60 %
Platelet Count: 325 10*3/uL (ref 150–400)
RBC: 3.45 MIL/uL — ABNORMAL LOW (ref 3.87–5.11)
RDW: 12.9 % (ref 11.5–15.5)
WBC Count: 6 10*3/uL (ref 4.0–10.5)
nRBC: 0 % (ref 0.0–0.2)

## 2020-09-01 LAB — CMP (CANCER CENTER ONLY)
ALT: 57 U/L — ABNORMAL HIGH (ref 0–44)
AST: 35 U/L (ref 15–41)
Albumin: 3.5 g/dL (ref 3.5–5.0)
Alkaline Phosphatase: 65 U/L (ref 38–126)
Anion gap: 8 (ref 5–15)
BUN: 10 mg/dL (ref 8–23)
CO2: 25 mmol/L (ref 22–32)
Calcium: 9.1 mg/dL (ref 8.9–10.3)
Chloride: 109 mmol/L (ref 98–111)
Creatinine: 0.67 mg/dL (ref 0.44–1.00)
GFR, Estimated: >60 mL/min (ref >60)
Glucose, Bld: 105 mg/dL — ABNORMAL HIGH (ref 70–99)
Potassium: 3.8 mmol/L (ref 3.5–5.1)
Sodium: 142 mmol/L (ref 135–145)
Total Bilirubin: 0.3 mg/dL (ref 0.3–1.2)
Total Protein: 6.8 g/dL (ref 6.5–8.1)

## 2020-09-01 LAB — TSH: TSH: 1.051 u[IU]/mL (ref 0.308–3.960)

## 2020-09-01 MED ORDER — HEPARIN SOD (PORK) LOCK FLUSH 100 UNIT/ML IV SOLN
500.0000 [IU] | Freq: Once | INTRAVENOUS | Status: AC | PRN
  Administered 2020-09-01: 500 [IU]
  Filled 2020-09-01: qty 5

## 2020-09-01 MED ORDER — SODIUM CHLORIDE 0.9 % IV SOLN
Freq: Once | INTRAVENOUS | Status: AC
  Filled 2020-09-01: qty 250

## 2020-09-01 MED ORDER — SODIUM CHLORIDE 0.9% FLUSH
10.0000 mL | INTRAVENOUS | Status: DC | PRN
  Administered 2020-09-01: 10 mL via INTRAVENOUS
  Filled 2020-09-01: qty 10

## 2020-09-01 MED ORDER — PROCHLORPERAZINE MALEATE 10 MG PO TABS
10.0000 mg | ORAL_TABLET | Freq: Once | ORAL | Status: AC
  Administered 2020-09-01: 10 mg via ORAL

## 2020-09-01 MED ORDER — SODIUM CHLORIDE 0.9 % IV SOLN
200.0000 mg | Freq: Once | INTRAVENOUS | Status: AC
  Administered 2020-09-01: 200 mg via INTRAVENOUS
  Filled 2020-09-01: qty 8

## 2020-09-01 MED ORDER — SODIUM CHLORIDE 0.9% FLUSH
10.0000 mL | INTRAVENOUS | Status: DC | PRN
  Administered 2020-09-01: 10 mL
  Filled 2020-09-01: qty 10

## 2020-09-01 MED ORDER — AMLODIPINE BESYLATE 5 MG PO TABS: 5.0000 mg | ORAL_TABLET | Freq: Every day | ORAL | 3 refills | Status: DC

## 2020-09-01 MED ORDER — PROCHLORPERAZINE MALEATE 10 MG PO TABS
ORAL_TABLET | ORAL | Status: AC
  Filled 2020-09-01: qty 1

## 2020-09-01 MED ORDER — SODIUM CHLORIDE 0.9 % IV SOLN
500.0000 mg/m2 | Freq: Once | INTRAVENOUS | Status: AC
  Administered 2020-09-01: 800 mg via INTRAVENOUS
  Filled 2020-09-01: qty 12

## 2020-09-01 MED FILL — AMLODIPINE BESYLATE 5 MG TA: 5 | 30 days supply | Qty: 30 | Fill #0

## 2020-09-01 NOTE — Progress Notes (Signed)
Okay to proceed with treatment with BP per Dr. Lorenso Courier.

## 2020-09-01 NOTE — Patient Instructions (Signed)
[facility] Discharge Instructions for Patients Receiving Chemotherapy  Today you received the following immunotherapy agent: Pembrolizumab (Keytruda) and chemotherapy agent: Pemetrexed (Alimta)  To help prevent nausea and vomiting after your treatment, we encourage you to take your nausea medication as directed by your MD.   If you develop nausea and vomiting that is not controlled by your nausea medication, call the clinic.   BELOW ARE SYMPTOMS THAT SHOULD BE REPORTED IMMEDIATELY:  *FEVER GREATER THAN 100.5 F  *CHILLS WITH OR WITHOUT FEVER  NAUSEA AND VOMITING THAT IS NOT CONTROLLED WITH YOUR NAUSEA MEDICATION  *UNUSUAL SHORTNESS OF BREATH  *UNUSUAL BRUISING OR BLEEDING  TENDERNESS IN MOUTH AND THROAT WITH OR WITHOUT PRESENCE OF ULCERS  *URINARY PROBLEMS  *BOWEL PROBLEMS  UNUSUAL RASH Items with * indicate a potential emergency and should be followed up as soon as possible.  Feel free to call the clinic should you have any questions or concerns. The clinic phone number is [phone].  Please show the [facility] at check-in to the Emergency Department and triage nurse.

## 2020-09-01 NOTE — Telephone Encounter (Signed)
Scheduled apt per 12/23 sch msg - pt is aware of appt.

## 2020-09-01 NOTE — Progress Notes (Signed)
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 CT of the head showed showed concerning for metastatic disease involving the left cerebellum and right occipital lobe.  2) 09/30/2019: MRI brain confirms mass within the left cerebellum measures 1.6 x 1.3 x 1.5 cm as well as at least 8 metastatic lesions within the supratentorial and infratentorial brain as outlined 3) 10/01/2019: PET CT scan revealed hypermetabolic infrahilar right lower lobe mass with hypermetabolic nodal metastases in the right hilum, mediastinum, right supraclavicular region, left axilla and lower left neck. 4) 10/08/2019: US guided biopsy of the lymph nodes confirms poorly differentiated non-small cell carcinoma. Immunohistochemistry confirms adenocarcinoma of lung primary. PD-L1 and NGS later revealed a MET Exon 14 mutation and TPS of 90%.  5) 10/12/2019: Establish care with Dr. Lorenso Courier  6) 11/04/2019: Day 1 of capmatinib 412m BID 7) 11/09/2019: presented to Rad/Onc with a DVT in LLE. Seen in symptom management clinic and started on apixaban.  8) 12/29/2019: CT C/A/P showed interval decrease in size of mass involving the superior segment of right lower lobe and reduction in mediastinal and left supraclavicular adenopathy though some small spinal lesions were noted in the interim between the two sets of scans 9) 01/25/2020: temporarily stopped capmatinib due to symptoms of nausea/fatigue. Restarted therapy on 01/30/2020 after brief chemo holiday.  10) 03/22/2020: CT C/A/P reveals a mixed picture, with new lung nodule and  increased lymphadenopathy, however response in bone lesions. D/c capmatinib therapy 11) 04/08/2020: start of Carbo/Pem/Pem for 2nd line treatment. Cycle 1 Day 1   12) 04/29/2020: Cycle 2 Day 1 Carbo/Pem/Pem  13) 05/20/2020: Cycle 3 Day 1 Carbo/Pem/Pem  14) 06/09/2020: Cycle 4 Day 1 Carbo/Pem/Pem  15) 06/29/2020: Cycle 5 Day 1 maintenance Pem/Pem  16) 07/20/2020: Cycle 6 Day 1 maintenance Pem/Pem  17) 08/12/2020: Cycle 7 Day 1 maintenance Pem/Pem  18) 09/01/2020: Cycle 8 Day 1 maintenance Pem/Pem   Interval History:  Virginia DUQUETTE64y.o. female with medical history significant for metastatic adenocarcinoma of the lung presents for a follow up visit. The patient's last visit was on 08/12/2020 prior to the start of Cycle 7 Day 1 of Pem/Pem. In the interim since the last visit she has had no new symptoms. Today is Cycle 8 Day 1.   On exam today Virginia [name] is accompanied by her husband.  Reports she has some emotional difficulty last night when she began to think about her long-term prognosis with the cancer diagnosis.  She reports that she otherwise is doing quite well and tolerated chemotherapy during her last cycle without difficulty.  [facility] with oxycodone approximately 3 pills as needed daily.  She notes that she is not having any nausea, vomiting, or diarrhea at this time.  She also denies any fevers, chills, sweats.  A full 10 point ROS is listed below.   MEDICAL HISTORY:  Past Medical History:  Diagnosis Date  . Anemia   . Anxiety   . Concussion 09/28/2019  . [place]   [place]  . Dyspnea   . GERD (gastroesophageal reflux disease)   .  Hypercholesterolemia    per pt, she does not have elevated lipids  . Hypertension   . met lung ca dx'd 09/2019   mets to spine, hip and brain  . PONV (postoperative nausea and vomiting)   . Tobacco abuse     SURGICAL HISTORY: Past Surgical History:  Procedure Laterality Date  .  ABDOMINAL HYSTERECTOMY     partial/ left ovaries  . CHOLECYSTECTOMY    . DILATION AND CURETTAGE OF UTERUS    . IR IMAGING GUIDED PORT INSERTION  10/23/2019  . KYPHOPLASTY N/A 03/15/2020   Procedure: Thoracic Eight KYPHOPLASTY;  Surgeon: Erline Levine, MD;  Location: [place];  Service: Neurosurgery;  Laterality: N/A;  prone   . LIPOMA EXCISION  2018   removed under left breast and right thigh.  . TUBAL LIGATION      ALLERGIES:  has No Known Allergies.  MEDICATIONS:  Current Outpatient Medications  Medication Sig Dispense Refill  . albuterol (VENTOLIN HFA) 108 (90 Base) MCG/ACT inhaler Inhale 2 puffs into the lungs every 6 (six) hours as needed for wheezing or shortness of breath. 6.7 g 11  . ALPRAZolam (XANAX) 0.25 MG tablet Take 1 tablet (0.25 mg total) by mouth 2 (two) times daily as needed. for anxiety (Patient taking differently: Take 0.25 mg by mouth 2 (two) times daily as needed for anxiety. ) 60 tablet 5  . clarithromycin (BIAXIN) 500 MG tablet Take 1 tablet (500 mg total) by mouth 2 (two) times daily. 20 tablet 0  . Cyanocobalamin (VITAMIN B12 PO) Take 2 tablets by mouth daily. Gummies    . folic acid (FOLVITE) 1 MG tablet Take 1 tablet (1 mg total) by mouth daily. 90 tablet 1  . furosemide (LASIX) 20 MG tablet 20 mg once daily for 3 days, then stop. May repeat if edema recurs. (Patient not taking: Reported on 05/20/2020) 30 tablet 1  . glycerin adult 2 g suppository Place 1 suppository rectally as needed for constipation. (Patient not taking: Reported on 05/20/2020) 12 suppository 0  . lidocaine-prilocaine (EMLA) cream Apply 1 application topically as needed. Apply to port as needed. (Patient not taking: Reported on 08/12/2020) 30 g 0  . methocarbamol (ROBAXIN) 500 MG tablet Take 500 mg by mouth 3 (three) times daily as needed. (Patient not taking: Reported on 05/20/2020)    . Multiple Vitamins-Minerals (AIRBORNE PO) Take 1 tablet by mouth daily.     Marland Kitchen ofloxacin (OCUFLOX) 0.3 % ophthalmic  solution 2 drops each eye 4 times a day for 5 days 5 mL 1  . OLANZapine (ZYPREXA) 2.5 MG tablet Take 1 tablet (2.5 mg total) by mouth at bedtime. 90 tablet 1  . oxyCODONE (OXY IR/ROXICODONE) 5 MG immediate release tablet TAKE 1-2 TABLETS (5-10 MG TOTAL) BY MOUTH EVERY 4 (FOUR) HOURS AS NEEDED FOR SEVERE PAIN. 90 tablet 0  . pantoprazole (PROTONIX) 40 MG tablet TAKE ONE TABLET BY MOUTH DAILY 90 tablet 3  . pentoxifylline (TRENTAL) 400 MG CR tablet Take 458m tab daily x 1 week, then 4041mBID; take with food 60 tablet 4  . polyethylene glycol (MIRALAX / GLYCOLAX) 17 g packet Take 17 g by mouth 2 (two) times daily.     . prochlorperazine (COMPAZINE) 10 MG tablet TAKE 1 TABLET BY MOUTH EVERY 6 HOURS AS NEEDED FOR NAUSEA OR VOMITING (Patient not taking: Reported on 02/25/2020) 30 tablet 0  . sennosides-docusate sodium (SENOKOT-S) 8.6-50 MG tablet Take 2-3 tablets by mouth See admin instructions. 2 in the morning and 3 at  night. Adjust as needed (Patient not taking: Reported on 05/20/2020)    . sucralfate (CARAFATE) 1 g tablet Dissolve 1 tablet in 10 mL H20 and swallow 30 min prior to meals and bedtime. (Patient not taking: Reported on 02/25/2020) 50 tablet 3  . Vitamin D3 (VITAMIN D) 25 MCG tablet Take 2,000 Units by mouth daily.    . vitamin E 180 MG (400 UNITS) capsule Take 400 IU daily x 1 week, then 400 IU BID 60 capsule 4  . XTAMPZA ER 13.5 MG C12A TAKE 1 CAPSULE BY MOUTH EVERY 12 HOURS 60 capsule 0   No current facility-administered medications for this visit.    REVIEW OF SYSTEMS:   Constitutional: ( - ) fevers, ( - )  chills , ( - ) night sweats Eyes: ( - ) blurriness of vision, ( - ) double vision, ( - ) watery eyes Ears, nose, mouth, throat, and face: ( - ) mucositis, ( - ) sore throat Respiratory: ( - ) cough, ( - ) dyspnea, ( - ) wheezes Cardiovascular: ( - ) palpitation, ( - ) chest discomfort, ( - ) lower extremity swelling Gastrointestinal:  ( - ) nausea, ( - ) heartburn, ( - ) change  in bowel habits Skin: ( - ) abnormal skin rashes Lymphatics: ( - ) new lymphadenopathy, ( - ) easy bruising Neurological: ( - ) numbness, ( - ) tingling, ( - ) new weaknesses Behavioral/Psych: ( - ) mood change, ( - ) new changes  All other systems were reviewed with the patient and are negative.  PHYSICAL EXAMINATION: ECOG PERFORMANCE STATUS: 1 - Symptomatic but completely ambulatory  Vitals:   09/01/20 1125  BP: (!) 139/101  Pulse: 85  Resp: 18  Temp: (!) 96.6 F (35.9 C)  SpO2: 100%   Filed Weights   09/01/20 1125  Weight: 137 lb 3.2 oz (62.2 kg)    GENERAL: well appearing middle aged Caucasian female in NAD  SKIN: skin color, texture, turgor are normal, no rashes or significant lesions EAR: left ear tympanic membrane mildly erythematous. No discharge or clear signs of bacterial infection.  EYES: conjunctiva are pink and non-injected, sclera clear LUNGS: clear to auscultation and percussion with normal breathing effort HEART: regular rate & rhythm and no murmurs and bilateral + trace pitting lower extremity edema Musculoskeletal: no cyanosis of digits and no clubbing PSYCH: alert & oriented x 3, fluent speech NEURO: no focal motor/sensory deficits   LABORATORY DATA:  I have reviewed the data as listed CBC Latest Ref Rng & Units 09/01/2020 08/12/2020 07/20/2020  WBC 4.0 - 10.5 K/uL 6.0 4.6 4.9  Hemoglobin 12.0 - 15.0 g/dL 11.7(L) 10.7(L) 10.3(L)  Hematocrit 36.0 - 46.0 % 34.4(L) 31.1(L) 30.1(L)  Platelets 150 - 400 K/uL 325 259 315    CMP Latest Ref Rng & Units 09/01/2020 08/12/2020 07/20/2020  Glucose 70 - 99 mg/dL 105(H) 129(H) 94  BUN 8 - 23 mg/dL 10 7(L) 12  Creatinine 0.44 - 1.00 mg/dL 0.67 0.63 0.66  Sodium 135 - 145 mmol/L 142 141 142  Potassium 3.5 - 5.1 mmol/L 3.8 3.7 4.0  Chloride 98 - 111 mmol/L 109 107 107  CO2 22 - 32 mmol/L _0 Calcium 8.9 - 10.3 mg/dL 9.1 8.8(L) 8.9  Total Protein 6.5 - 8.1 g/dL 6.8 6.2(L) 6.2(L)  Total Bilirubin 0.3 - 1.2  mg/dL 0.3 0.3 0.3  Alkaline Phos 38 - 126 U/L 65 75 69  AST 15 - 41 U/L 35 46(H) 46(H)  ALT 0 - 44 U/L 57(H) 62(H) 64(H)     RADIOGRAPHIC STUDIES: I personally have viewed the radiographic studies below: CT C/A/P showed increased lymphadenopathy of supraclavicular node with new lung nodule. Bone lesions responding. Evidence of T8 kyphoplasty. MR Brain W Wo Contrast  Result Date: 08/18/2020 CLINICAL DATA:  Follow-up S RS treatment.  Metastatic lung cancer. EXAM: MRI HEAD WITHOUT AND WITH CONTRAST TECHNIQUE: Multiplanar, multiecho pulse sequences of the brain and surrounding structures were obtained without and with intravenous contrast. CONTRAST:  35mL MULTIHANCE GADOBENATE DIMEGLUMINE 529 MG/ML IV SOLN COMPARISON:  04/29/2020.  02/10/2020. 09/30/2019 FINDINGS: BRAIN New Lesions: There are no new lesions that were not visible either on the immediate prior exam or older studies including 10/28/2019. Larger lesions: Enlargement of the region of enhancement in the medial left cerebellum, now measuring 13 x 19 mm compared with about 7 x 12 mm on the last exam. Increased edema within the left cerebellum. Findings could be due to treatment effect or tumor progression. Three other small foci of enhancement within the cerebellum, 2 within the right hemisphere axial image 47 and 1 within the vermis axial image 44 appear more conspicuous than on the immediate prior exam. These are all at the site of previous treated lesions. Is unclear if there are better seen because of technical factors/less motion, if there is minor treatment effect or if there is actual increase in enhancing tissue. Within the cerebral hemispheres, there is slightly more enhancement evident along the surface of the brain in a sulcus of the right parietal lobe axial image 107/108. This is the site of a previous lesion but did not show any enhancement on the immediate previous study. Therefore, similarly, this could be due to treatment effect,  technical issues or some recurrence of tumor. Stable or Smaller lesions: Other treated foci within the cerebral hemispheres are all either inapparent or stable. Stable foci of enhancement are evident image 79 in the right occipital lobe, image 107 in the anterior left frontal lobe, and image 128 at the left frontoparietal junction region. Other Brain findings: Background pattern of chronic small vessel ischemic change throughout the brain. Some increase in generalized T2 and FLAIR signal of the white matter, probably secondary to radiation change. Resolution of thin subdural hygromas seen previously. No residual or recurrent subdural collection. No hydrocephalus. Vascular: Major vessels at the base of the brain show flow. Skull and upper cervical spine: Negative Sinuses/Orbits: Clear/normal Other: None IMPRESSION: 1. No definite new metastatic lesion. 2. Enlargement of the region of enhancement in the medial left cerebellum, now measuring 13 x 19 mm compared with about 7 x 12 mm on the last exam. Increased edema within the left cerebellum. Findings could be due to treatment effect or tumor progression. 3. Three other small foci of enhancement within the cerebellum, 2 within the right hemisphere axial image 47 and 1 within the vermis appear more conspicuous than on the immediate prior exam. These are at the site of previous treated lesions in I think are indeterminate for treatment effect, disease progression or simply improved conspicuity due to technical factors. 4. Slight increased enhancement along the surface of the brain in a sulcus of the right parietal lobe axial image 107/108. This is the site of a previous lesion but did not show any enhancement on the immediate previous study. Therefore, this could be due to treatment effect, technical issues or some recurrence of tumor. 5. Other treated lesions are stable or inapparent. 6. Increased T2 and FLAIR signal  of the white matter, likely due to radiation change.  7. Resolution of small bilateral subdural hygromas. Electronically Signed   By: Nelson Chimes M.D.   On: 08/18/2020 13:20    ASSESSMENT & PLAN ELWYN LOWDEN 64 y.o. female with medical history significant for metastatic adenocarcinoma of the lung presents for a follow up visit. 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   Today Virginia [name] reports she is currently stable at her baseline.  Her pain continues to be under good control and she is not having any new symptoms at this time. Overall she is willing and able to proceed with chemotherapy had no reservations about moving forward.  She is also not demonstrating any symptoms of recurrent VTE or signs or symptoms concerning for worsening brain bleed.  Previously we discussed Carboplatin/Pembrolizumab/Pemetrexed and the expected side effect from these medications.  We discussed the possible side effects of immunotherapy including colitis, hepatitis, dermatitis, and pneumonitis.  Additionally we discussed the side effects of carboplatin which include suppression of blood counts, nephrotoxicity, and nausea.  Additionally we discussed pemetrexed which can also have effects on counts and would require supplementation with vitamin S50 and folic acid.  She voiced her understanding of these side effects and treatment plans moving forward.  I also noted that we would be able to drop the chemotherapy agents that she had intolerance to continue with pembrolizumab alone given her high TPS score.  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 --port in place, patient completed chemotherapy education.  --plan for repeat CT C/A/P in Jan 2022 (3 months from last scan in Oct  2021) --supportive therapy as listed below.  --RTC q 3 weeks for continued monitoring/chemotherapy treatments.    #Nausea/Vomiting -- provided patient with Zofran 4-51m q8h PRN and compazine [id number] for breakthrough PRN. These have not been required since olanzapine was added --continue olanzpaine 2.45m30mO QHS to help with nausea. Marked improvement since the addition of this medication on 01/29/2020.   --continue to monitor   #Hypokalemia, stable --likely 2/2 to decreased PO intake, hypovolemia, and BP medications --given her decreased BP would recommend holding losartan and hydrochlorothiazide --continue to monitor  #Pain Control, improving --continue oxycodone 45mg59mH PRN. Can increase dose to 10mg37m if pain is severe. She is taking approximately 1-2 pills per day.  --continue Xtampza for long acting support.  --continue to take with Senokot to prevent opioid induced constipation. 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  --nausea as above   #Headaches, resolved --appreciate the assistance of Dr. VasloMickeal Skinnerreating her brain metastasis and symptoms. --radiation therapy to the brain completed on 11/16/2019.  --MRI brain on 04/29/2020 showed evidence of small brain bleed. Eliquis therapy was discontinued.  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    No orders of the defined types were placed in this encounter.  All questions were answered. The patient knows to call the clinic with any problems, questions  or concerns.  A total of more than 30 minutes were spent on this encounter and over half of that time was spent on counseling and coordination of care as outlined above.   Ledell Peoples, MD Department of  Hematology/Oncology [place] [facility] Phone: [phone] Pager: [phone] Email: [email or web address]  09/01/2020 12:59 PM   Literature Support:  Lorenza Chick, Rodrguez-Abreu D, Duffy Rhody, Felip E, De Angelis F, Domine M, [place], Hochmair MJ, [place], [place], Bischoff HG, [place], Grossi F, [place], Reck M, [place], [place], [place], Rubio-Viqueira B, Novello S, Kurata T, Gray JE, Vida J, Wei Z, Yang J, Raftopoulos H, [place], [place] [employer]; [id number] Investigators. Pembrolizumab plus Chemotherapy in Metastatic Non-Small-Cell Lung Cancer. [place] Med. 2018 May 31;378(22):2078-2092.  --Median progression-free survival was 8.8 months (95% CI, 7.6 to 9.2) in the pembrolizumab-combination group and 4.9 months (95% CI, 4.7 to 5.5) in the placebo-combination group (hazard ratio for disease progression or death, 0.52; 95% CI, 0.43 to 0.64; P<0.001). Adverse events of grade 3 or higher occurred in 67.2% of the patients in the pembrolizumab-combination group and in 65.8% of those in the placebo-combination group.  Jodene Nam L. Efficacy of pemetrexed-based regimens in advanced non-small cell lung cancer patients with activating epidermal growth factor receptor mutations after tyrosine kinase inhibitor failure: a systematic review. Onco Targets Ther. 2018;11:2121-2129.   --The weighted median PFS, median OS, and ORR for patients treated with pem regimens were 5.09 months, 15.91 months, and 30.19%, respectively. Our systematic review results showed a favorable efficacy profile of pem regimens in NSCLC patients with EGFR mutation after EGFR-TKI failure.

## 2020-09-08 ENCOUNTER — Telehealth: Payer: Self-pay | Admitting: Internal Medicine

## 2020-09-08 ENCOUNTER — Inpatient Hospital Stay (HOSPITAL_BASED_OUTPATIENT_CLINIC_OR_DEPARTMENT_OTHER): Payer: BC Managed Care – PPO | Admitting: Internal Medicine

## 2020-09-08 ENCOUNTER — Other Ambulatory Visit: Payer: Self-pay

## 2020-09-08 VITALS — BP 135/101 | HR 103 | Temp 97.3°F | Resp 18 | Ht 62.0 in | Wt 135.3 lb

## 2020-09-08 DIAGNOSIS — Z5112 Encounter for antineoplastic immunotherapy: Secondary | ICD-10-CM | POA: Diagnosis not present

## 2020-09-08 DIAGNOSIS — R112 Nausea with vomiting, unspecified: Secondary | ICD-10-CM | POA: Diagnosis not present

## 2020-09-08 DIAGNOSIS — C7931 Secondary malignant neoplasm of brain: Secondary | ICD-10-CM | POA: Diagnosis not present

## 2020-09-08 DIAGNOSIS — I6782 Cerebral ischemia: Secondary | ICD-10-CM | POA: Diagnosis not present

## 2020-09-08 DIAGNOSIS — E876 Hypokalemia: Secondary | ICD-10-CM | POA: Diagnosis not present

## 2020-09-08 DIAGNOSIS — C778 Secondary and unspecified malignant neoplasm of lymph nodes of multiple regions: Secondary | ICD-10-CM | POA: Diagnosis not present

## 2020-09-08 DIAGNOSIS — Z87891 Personal history of nicotine dependence: Secondary | ICD-10-CM | POA: Diagnosis not present

## 2020-09-08 DIAGNOSIS — Z923 Personal history of irradiation: Secondary | ICD-10-CM | POA: Diagnosis not present

## 2020-09-08 DIAGNOSIS — Z9049 Acquired absence of other specified parts of digestive tract: Secondary | ICD-10-CM | POA: Diagnosis not present

## 2020-09-08 DIAGNOSIS — C7951 Secondary malignant neoplasm of bone: Secondary | ICD-10-CM | POA: Diagnosis not present

## 2020-09-08 DIAGNOSIS — R5383 Other fatigue: Secondary | ICD-10-CM | POA: Diagnosis not present

## 2020-09-08 DIAGNOSIS — C3431 Malignant neoplasm of lower lobe, right bronchus or lung: Secondary | ICD-10-CM | POA: Diagnosis not present

## 2020-09-08 DIAGNOSIS — Z79899 Other long term (current) drug therapy: Secondary | ICD-10-CM | POA: Diagnosis not present

## 2020-09-08 DIAGNOSIS — Z86718 Personal history of other venous thrombosis and embolism: Secondary | ICD-10-CM | POA: Diagnosis not present

## 2020-09-08 DIAGNOSIS — R55 Syncope and collapse: Secondary | ICD-10-CM | POA: Diagnosis not present

## 2020-09-08 DIAGNOSIS — Z5111 Encounter for antineoplastic chemotherapy: Secondary | ICD-10-CM | POA: Diagnosis not present

## 2020-09-08 NOTE — Progress Notes (Signed)
[facility], [place] [address] [phone]   Interval Evaluation  Date of Service: 09/08/20 Patient Name: Virginia [name] Patient MRN: [id number] Patient DOB: 1956/08/16 Provider: Ventura Sellers, MD  Identifying Statement:  Virginia [name] is a 64 y.o. 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 Isidore Moos  Interval History:  Virginia [name] presents today to discuss recent changes on MRI brain.  She describes no new or progressive neurologic deficits.  No seizures or headaches.  Gait/balance issues from earlier this year have not recurred.  She is tolerating chemotherapy well with Dr. Lorenso Courier.  H+P (10/15/19) Patient presents to clinic to discuss recent neurologic symptoms and imaging findings.  She describes a fall while climbing stairs on 1/20; she hit the back of her and lost consciousness.  She is unclear whether she lost her balance or had mechanical provocation, but she had been drinking that evening.  She was treated initially as a trauma, CT demonstrated a lesion in the posterior fossa, later confirmed as a metastasis by MRI, along with 7 additional smaller lesions.  It took her almost two weeks to return for cognition and balance to return to "normal" which she feels she is about at right now.  Baseline she is high functioning and independent, works as a Neurosurgeon.  Has been taking decadron 63m twice per day.  Plans to initiate chemotherapy soon with Dr. DLorenso Courier  Medications: Current Outpatient Medications on File Prior to Visit  Medication Sig Dispense Refill  . albuterol (VENTOLIN HFA) 108 (90 Base) MCG/ACT inhaler Inhale 2 puffs into the lungs every 6 (six) hours as needed for wheezing or shortness of breath. 6.7 g 11  . ALPRAZolam (XANAX) 0.25 MG tablet Take 1 tablet (0.25 mg total) by mouth 2  (two) times daily as needed. for anxiety (Patient taking differently: Take 0.25 mg by mouth 2 (two) times daily as needed for anxiety. ) 60 tablet 5  . amLODipine (NORVASC) 5 MG tablet Take 1 tablet (5 mg total) by mouth daily. 30 tablet 3  . clarithromycin (BIAXIN) 500 MG tablet Take 1 tablet (500 mg total) by mouth 2 (two) times daily. 20 tablet 0  . Cyanocobalamin (VITAMIN B12 PO) Take 2 tablets by mouth daily. Gummies    . folic acid (FOLVITE) 1 MG tablet Take 1 tablet (1 mg total) by mouth daily. 90 tablet 1  . furosemide (LASIX) 20 MG tablet 20 mg once daily for 3 days, then stop. May repeat if edema recurs. (Patient not taking: Reported on 05/20/2020) 30 tablet 1  . glycerin adult 2 g suppository Place 1 suppository rectally as needed for constipation. (Patient not taking: Reported on 05/20/2020) 12 suppository 0  . lidocaine-prilocaine (EMLA) cream Apply 1 application topically as needed. Apply to port as needed. (Patient not taking: Reported on 08/12/2020) 30 g 0  . methocarbamol (ROBAXIN) 500 MG tablet Take 500 mg by mouth 3 (three) times daily as needed. (Patient not taking: Reported on 05/20/2020)    . Multiple Vitamins-Minerals (AIRBORNE PO) Take 1 tablet by mouth daily.     .Marland Kitchenofloxacin (OCUFLOX) 0.3 % ophthalmic solution 2 drops each eye 4 times a day for 5 days 5 mL 1  . OLANZapine (ZYPREXA) 2.5 MG tablet Take 1 tablet (2.5 mg total) by mouth at bedtime. 90 tablet 1  .  oxyCODONE (OXY IR/ROXICODONE) 5 MG immediate release tablet TAKE 1-2 TABLETS (5-10 MG TOTAL) BY MOUTH EVERY 4 (FOUR) HOURS AS NEEDED FOR SEVERE PAIN. 90 tablet 0  . pantoprazole (PROTONIX) 40 MG tablet TAKE ONE TABLET BY MOUTH DAILY 90 tablet 3  . pentoxifylline (TRENTAL) 400 MG CR tablet Take 462m tab daily x 1 week, then 406mBID; take with food 60 tablet 4  . polyethylene glycol (MIRALAX / GLYCOLAX) 17 g packet Take 17 g by mouth 2 (two) times daily.     . prochlorperazine (COMPAZINE) 10 MG tablet TAKE 1 TABLET BY MOUTH  EVERY 6 HOURS AS NEEDED FOR NAUSEA OR VOMITING (Patient not taking: Reported on 02/25/2020) 30 tablet 0  . sennosides-docusate sodium (SENOKOT-S) 8.6-50 MG tablet Take 2-3 tablets by mouth See admin instructions. 2 in the morning and 3 at night. Adjust as needed (Patient not taking: Reported on 05/20/2020)    . sucralfate (CARAFATE) 1 g tablet Dissolve 1 tablet in 10 mL H20 and swallow 30 min prior to meals and bedtime. (Patient not taking: Reported on 02/25/2020) 50 tablet 3  . Vitamin D3 (VITAMIN D) 25 MCG tablet Take 2,000 Units by mouth daily.    . vitamin E 180 MG (400 UNITS) capsule Take 400 IU daily x 1 week, then 400 IU BID 60 capsule 4  . XTAMPZA ER 13.5 MG C12A TAKE 1 CAPSULE BY MOUTH EVERY 12 HOURS 60 capsule 0   No current facility-administered medications on file prior to visit.    Allergies: No Known Allergies Past Medical History:  Past Medical History:  Diagnosis Date  . Anemia   . Anxiety   . Concussion 09/28/2019  . D[place] (deep venous thrombosis) ([facility]   L leg  . Dyspnea   . GERD (gastroesophageal reflux disease)   . Hypercholesterolemia    per pt, she does not have elevated lipids  . Hypertension   . met lung ca dx'd 09/2019   mets to spine, hip and brain  . PONV (postoperative nausea and vomiting)   . Tobacco abuse    Past Surgical History:  Past Surgical History:  Procedure Laterality Date  . ABDOMINAL HYSTERECTOMY     partial/ left ovaries  . CHOLECYSTECTOMY    . DILATION AND CURETTAGE OF UTERUS    . IR IMAGING GUIDED PORT INSERTION  10/23/2019  . KYPHOPLASTY N/A 03/15/2020   Procedure: Thoracic Eight KYPHOPLASTY;  Surgeon: StErline LevineMD;  [facility];  Laterality: N/A;  prone   . LIPOMA EXCISION  2018   removed under left breast and right thigh.  . TUBAL LIGATION     Social History:  Social History   Socioeconomic History  . Marital status: Married    Spouse name: Not on file  . Number of children: 3  . Years of  education: Not on file  . Highest education level: Not on file  Occupational History  . Occupation: owns [employer]  Tobacco Use  . Smoking status: Former Smoker    Packs/day: 0.25    Quit date: 10/31/2012    Years since quitting: 7.8  . Smokeless tobacco: Never Used  Vaping Use  . Vaping Use: Never used  Substance and Sexual Activity  . Alcohol use: Yes    Alcohol/week: 0.0 standard drinks    Comment: rare  . Drug use: No  . Sexual activity: Yes  Other Topics Concern  . Not on file  Social History  Narrative  . Not on file   Social Determinants of Health   Financial Resource Strain: Not on file  Food Insecurity: Not on file  Transportation Needs: Not on file  Physical Activity: Not on file  Stress: Not on file  Social Connections: Not on file  Intimate Partner Violence: Not on file   Family History:  Family History  Problem Relation Age of Onset  . Heart disease Father   . Drug abuse Daughter   . Drug abuse Son   . Cancer Sister   . Heart disease Brother   . Heart attack Brother     Review of Systems: Constitutional: Doesn't report fevers, chills or abnormal weight loss Eyes: Doesn't report blurriness of vision Ears, nose, mouth, throat, and face: Doesn't report sore throat Respiratory: Doesn't report cough, dyspnea or wheezes Cardiovascular: Doesn't report palpitation, chest discomfort  Gastrointestinal:  Doesn't report nausea, constipation, diarrhea GU: Doesn't report incontinence Skin: Doesn't report skin rashes Neurological: Per HPI Musculoskeletal: Doesn't report joint pain Behavioral/Psych: Doesn't report anxiety  Physical Exam: Vitals:   09/08/20 1137  BP: (!) 135/101  Pulse: (!) 103  Resp: 18  Temp: (!) 97.3 F (36.3 C)  SpO2: 99%   KPS: 90. General: Alert, cooperative, pleasant, in no acute distress Head: Normal EENT: No conjunctival injection or scleral icterus.  Lungs: Resp effort normal Cardiac: Regular  rate Abdomen: Non-distended abdomen Skin: No rashes cyanosis or petechiae. Extremities: No clubbing or edema  Neurologic Exam: Mental Status: Awake, alert, attentive to examiner. Oriented to self and environment. Language is fluent with intact comprehension.  Cranial Nerves: Visual acuity is grossly normal. Visual fields are full. Extra-ocular movements intact. No ptosis. Face is symmetric Motor: Tone and bulk are normal. Power is full in both arms and legs. Reflexes are symmetric, no pathologic reflexes present.  Sensory: Intact to light touch Gait: Moderate impairment to tandem gait only  Labs: I have reviewed the data as listed    Component Value Date/Time   NA 142 09/01/2020 1107   K 3.8 09/01/2020 1107   CL 109 09/01/2020 1107   CO2 25 09/01/2020 1107   GLUCOSE 105 (H) 09/01/2020 1107   BUN 10 09/01/2020 1107   CREATININE 0.67 09/01/2020 1107   CREATININE 0.58 11/15/2017 0948   CALCIUM 9.1 09/01/2020 1107   PROT 6.8 09/01/2020 1107   ALBUMIN 3.5 09/01/2020 1107   AST 35 09/01/2020 1107   ALT 57 (H) 09/01/2020 1107   ALKPHOS 65 09/01/2020 1107   BILITOT 0.3 09/01/2020 1107   GFRNONAA >60 09/01/2020 1107   GFRNONAA 100 11/15/2017 0948   GFRAA >60 06/09/2020 1051   GFRAA 116 11/15/2017 0948   Lab Results  Component Value Date   WBC 6.0 09/01/2020   NEUTROABS 3.7 09/01/2020   HGB 11.7 (L) 09/01/2020   HCT 34.4 (L) 09/01/2020   MCV 99.7 09/01/2020   PLT 325 09/01/2020    Imaging:  [place] Clinician Interpretation: I have personally reviewed the CNS images as listed.  My interpretation, in the context of the patient's clinical presentation, is likely treatment effect  MR Brain W Wo Contrast  Result Date: 08/18/2020 CLINICAL DATA:  Follow-up S RS treatment.  Metastatic lung cancer. EXAM: MRI HEAD WITHOUT AND WITH CONTRAST TECHNIQUE: Multiplanar, multiecho pulse sequences of the brain and surrounding structures were obtained without and with intravenous contrast.  CONTRAST:  75m MULTIHANCE GADOBENATE DIMEGLUMINE 529 MG/ML IV SOLN COMPARISON:  04/29/2020.  02/10/2020. 09/30/2019 FINDINGS: BRAIN New Lesions: There are no new lesions  that were not visible either on the immediate prior exam or older studies including 10/28/2019. Larger lesions: Enlargement of the region of enhancement in the medial left cerebellum, now measuring 13 x 19 mm compared with about 7 x 12 mm on the last exam. Increased edema within the left cerebellum. Findings could be due to treatment effect or tumor progression. Three other small foci of enhancement within the cerebellum, 2 within the right hemisphere axial image 47 and 1 within the vermis axial image 44 appear more conspicuous than on the immediate prior exam. These are all at the site of previous treated lesions. Is unclear if there are better seen because of technical factors/less motion, if there is minor treatment effect or if there is actual increase in enhancing tissue. Within the cerebral hemispheres, there is slightly more enhancement evident along the surface of the brain in a sulcus of the right parietal lobe axial image 107/108. This is the site of a previous lesion but did not show any enhancement on the immediate previous study. Therefore, similarly, this could be due to treatment effect, technical issues or some recurrence of tumor. Stable or Smaller lesions: Other treated foci within the cerebral hemispheres are all either inapparent or stable. Stable foci of enhancement are evident image 79 in the right occipital lobe, image 107 in the anterior left frontal lobe, and image 128 at the left frontoparietal junction region. Other Brain findings: Background pattern of chronic small vessel ischemic change throughout the brain. Some increase in generalized T2 and FLAIR signal of the white matter, probably secondary to radiation change. Resolution of thin subdural hygromas seen previously. No residual or recurrent subdural collection. No  hydrocephalus. Vascular: Major vessels at the base of the brain show flow. Skull and upper cervical spine: Negative Sinuses/Orbits: Clear/normal Other: None IMPRESSION: 1. No definite new metastatic lesion. 2. Enlargement of the region of enhancement in the medial left cerebellum, now measuring 13 x 19 mm compared with about 7 x 12 mm on the last exam. Increased edema within the left cerebellum. Findings could be due to treatment effect or tumor progression. 3. Three other small foci of enhancement within the cerebellum, 2 within the right hemisphere axial image 47 and 1 within the vermis appear more conspicuous than on the immediate prior exam. These are at the site of previous treated lesions in I think are indeterminate for treatment effect, disease progression or simply improved conspicuity due to technical factors. 4. Slight increased enhancement along the surface of the brain in a sulcus of the right parietal lobe axial image 107/108. This is the site of a previous lesion but did not show any enhancement on the immediate previous study. Therefore, this could be due to treatment effect, technical issues or some recurrence of tumor. 5. Other treated lesions are stable or inapparent. 6. Increased T2 and FLAIR signal of the white matter, likely due to radiation change. 7. Resolution of small bilateral subdural hygromas. Electronically Signed   By: Nelson Chimes M.D.   On: 08/18/2020 13:20    Assessment/Plan Brain metastases ([place])   Virginia [name] is clinically stable today.  MRI demonstrates multiple foci of increased enhancement which we suspect are treatment related.    Because of relatively large lesion within or approximating cerebellar vermis, will recommend close imaging surveillance with repeat brain MRI in 2 months.   She may continue Vitamin E and Trental in the meantime which had been ordered through Dr. Pearlie Oyster office.  No need for dexamethasone at this  time.  We appreciate the opportunity to  participate in the care of Virginia [name].   We ask that Virginia [name] return to clinic in 2 months following next brain MRI, or sooner as needed.  All questions were answered. The patient knows to call the clinic with any problems, questions or concerns. No barriers to learning were detected.  The total time spent in the encounter was 30 minutes and more than 50% was on counseling and review of test results   Ventura Sellers, MD Medical Director of Neuro-Oncology [facility] [place] 09/08/20 11:45 AM

## 2020-09-08 NOTE — Telephone Encounter (Signed)
Scheduled follow-up appointment per 12/30 los. Patient is aware.

## 2020-09-16 ENCOUNTER — Other Ambulatory Visit: Payer: Self-pay

## 2020-09-16 ENCOUNTER — Ambulatory Visit (HOSPITAL_COMMUNITY)
Admission: RE | Admit: 2020-09-16 | Discharge: 2020-09-16 | Disposition: A | Discharge status: Home / Self Care | Payer: BC Managed Care – PPO | Source: Ambulatory Visit | Attending: Hematology and Oncology | Admitting: Hematology and Oncology

## 2020-09-16 DIAGNOSIS — C3431 Malignant neoplasm of lower lobe, right bronchus or lung: Secondary | ICD-10-CM | POA: Insufficient documentation

## 2020-09-16 DIAGNOSIS — I700 Atherosclerosis of aorta: Secondary | ICD-10-CM | POA: Diagnosis not present

## 2020-09-16 DIAGNOSIS — Z9049 Acquired absence of other specified parts of digestive tract: Secondary | ICD-10-CM | POA: Diagnosis not present

## 2020-09-16 DIAGNOSIS — R918 Other nonspecific abnormal finding of lung field: Secondary | ICD-10-CM | POA: Diagnosis not present

## 2020-09-16 DIAGNOSIS — C7951 Secondary malignant neoplasm of bone: Secondary | ICD-10-CM | POA: Diagnosis not present

## 2020-09-16 DIAGNOSIS — C3490 Malignant neoplasm of unspecified part of unspecified bronchus or lung: Secondary | ICD-10-CM | POA: Diagnosis not present

## 2020-09-16 MED ORDER — IOHEXOL 300 MG/ML  SOLN
100.0000 mL | Freq: Once | INTRAMUSCULAR | Status: AC | PRN
  Administered 2020-09-16: 100 mL via INTRAVENOUS

## 2020-09-19 ENCOUNTER — Other Ambulatory Visit: Payer: Self-pay | Admitting: Hematology and Oncology

## 2020-09-19 ENCOUNTER — Telehealth: Payer: Self-pay | Admitting: *Deleted

## 2020-09-19 MED ORDER — OXYCODONE HCL 5 MG PO TABS: 5.0000 mg | ORAL_TABLET | ORAL | 0 refills | Status: DC | PRN

## 2020-09-19 MED ORDER — XTAMPZA ER 13.5 MG PO C12A: EXTENDED_RELEASE_CAPSULE | ORAL | 0 refills | Status: DC

## 2020-09-19 MED FILL — oxyCODONE HCL 5 MG TABS: 5 | 8 days supply | Qty: 90 | Fill #0

## 2020-09-19 MED FILL — XTAMPZA ER 13.5 MG C12A: 13.5 | 30 days supply | Qty: 60 | Fill #0

## 2020-09-19 NOTE — Telephone Encounter (Signed)
TCT patient's husband regarding results of recent CT scan. Spoke with husband and advised that her scan looks 'excellent' per Dr. Lorenso Courier. No progression of cancer and no new spread.  Her scan is stable and we will continue with her current treatment plan.  [name] is very pleased with these results as is [name].  Also advised that her [facility] med[facility] have been [facility] to [facility].  [name] voiced understanding.

## 2020-09-19 NOTE — Telephone Encounter (Signed)
-----   Message from Orson Slick, MD sent at 09/19/2020  9:13 AM EST ----- Please let Virginia [name] know that her CT scan looks excellent with no evidence of progression or spread. Appears stable. We will continue on the current treatment plan.   ----- Message ----- From: Interface, Rad Results In Sent: 09/16/2020   5:35 PM EST To: Orson Slick, MD

## 2020-09-21 ENCOUNTER — Inpatient Hospital Stay: Payer: BC Managed Care – PPO

## 2020-09-21 ENCOUNTER — Other Ambulatory Visit: Payer: Self-pay

## 2020-09-21 ENCOUNTER — Encounter: Payer: Self-pay | Admitting: Hematology and Oncology

## 2020-09-21 ENCOUNTER — Inpatient Hospital Stay: Payer: BC Managed Care – PPO | Attending: Hematology and Oncology | Admitting: Hematology and Oncology

## 2020-09-21 VITALS — BP 122/91 | HR 80 | Temp 97.4°F | Resp 20 | Wt 137.7 lb

## 2020-09-21 DIAGNOSIS — R5383 Other fatigue: Secondary | ICD-10-CM | POA: Insufficient documentation

## 2020-09-21 DIAGNOSIS — Z7901 Long term (current) use of anticoagulants: Secondary | ICD-10-CM | POA: Insufficient documentation

## 2020-09-21 DIAGNOSIS — Z95828 Presence of other vascular implants and grafts: Secondary | ICD-10-CM

## 2020-09-21 DIAGNOSIS — R918 Other nonspecific abnormal finding of lung field: Secondary | ICD-10-CM

## 2020-09-21 DIAGNOSIS — C7951 Secondary malignant neoplasm of bone: Secondary | ICD-10-CM

## 2020-09-21 DIAGNOSIS — I700 Atherosclerosis of aorta: Secondary | ICD-10-CM | POA: Diagnosis not present

## 2020-09-21 DIAGNOSIS — C3431 Malignant neoplasm of lower lobe, right bronchus or lung: Secondary | ICD-10-CM | POA: Diagnosis not present

## 2020-09-21 DIAGNOSIS — Z5111 Encounter for antineoplastic chemotherapy: Secondary | ICD-10-CM | POA: Insufficient documentation

## 2020-09-21 DIAGNOSIS — Z79899 Other long term (current) drug therapy: Secondary | ICD-10-CM | POA: Insufficient documentation

## 2020-09-21 DIAGNOSIS — E876 Hypokalemia: Secondary | ICD-10-CM | POA: Diagnosis not present

## 2020-09-21 DIAGNOSIS — R42 Dizziness and giddiness: Secondary | ICD-10-CM | POA: Diagnosis not present

## 2020-09-21 DIAGNOSIS — Z86718 Personal history of other venous thrombosis and embolism: Secondary | ICD-10-CM | POA: Insufficient documentation

## 2020-09-21 DIAGNOSIS — C3490 Malignant neoplasm of unspecified part of unspecified bronchus or lung: Secondary | ICD-10-CM

## 2020-09-21 DIAGNOSIS — C7931 Secondary malignant neoplasm of brain: Secondary | ICD-10-CM

## 2020-09-21 DIAGNOSIS — Z5112 Encounter for antineoplastic immunotherapy: Secondary | ICD-10-CM | POA: Insufficient documentation

## 2020-09-21 LAB — CBC WITH DIFFERENTIAL (CANCER CENTER ONLY)
Abs Immature Granulocytes: 0.04 10*3/uL (ref 0.00–0.07)
Basophils Absolute: 0 10*3/uL (ref 0.0–0.1)
Basophils Relative: 1 %
Eosinophils Absolute: 0.2 10*3/uL (ref 0.0–0.5)
Eosinophils Relative: 2 %
HCT: 33.1 % — ABNORMAL LOW (ref 36.0–46.0)
Hemoglobin: 11.6 g/dL — ABNORMAL LOW (ref 12.0–15.0)
Immature Granulocytes: 1 %
Lymphocytes Relative: 21 %
Lymphs Abs: 1.9 10*3/uL (ref 0.7–4.0)
MCH: 33.3 pg (ref 26.0–34.0)
MCHC: 35 g/dL (ref 30.0–36.0)
MCV: 95.1 fL (ref 80.0–100.0)
Monocytes Absolute: 1.1 10*3/uL — ABNORMAL HIGH (ref 0.1–1.0)
Monocytes Relative: 12 %
Neutro Abs: 5.5 10*3/uL (ref 1.7–7.7)
Neutrophils Relative %: 63 %
Platelet Count: 310 10*3/uL (ref 150–400)
RBC: 3.48 MIL/uL — ABNORMAL LOW (ref 3.87–5.11)
RDW: 12.4 % (ref 11.5–15.5)
WBC Count: 8.7 10*3/uL (ref 4.0–10.5)
nRBC: 0 % (ref 0.0–0.2)

## 2020-09-21 LAB — CMP (CANCER CENTER ONLY)
ALT: 56 U/L — ABNORMAL HIGH (ref 0–44)
AST: 34 U/L (ref 15–41)
Albumin: 3.3 g/dL — ABNORMAL LOW (ref 3.5–5.0)
Alkaline Phosphatase: 73 U/L (ref 38–126)
Anion gap: 8 (ref 5–15)
BUN: 10 mg/dL (ref 8–23)
CO2: 26 mmol/L (ref 22–32)
Calcium: 9.1 mg/dL (ref 8.9–10.3)
Chloride: 106 mmol/L (ref 98–111)
Creatinine: 0.68 mg/dL (ref 0.44–1.00)
GFR, Estimated: >60 mL/min (ref >60)
Glucose, Bld: 110 mg/dL — ABNORMAL HIGH (ref 70–99)
Potassium: 3.8 mmol/L (ref 3.5–5.1)
Sodium: 140 mmol/L (ref 135–145)
Total Bilirubin: 0.3 mg/dL (ref 0.3–1.2)
Total Protein: 6.8 g/dL (ref 6.5–8.1)

## 2020-09-21 LAB — TSH: TSH: 1.597 u[IU]/mL (ref 0.308–3.960)

## 2020-09-21 MED ORDER — SODIUM CHLORIDE 0.9 % IV SOLN
200.0000 mg | Freq: Once | INTRAVENOUS | Status: AC
  Administered 2020-09-21: 200 mg via INTRAVENOUS
  Filled 2020-09-21: qty 8

## 2020-09-21 MED ORDER — SODIUM CHLORIDE 0.9% FLUSH
10.0000 mL | INTRAVENOUS | Status: DC | PRN
  Administered 2020-09-21: 10 mL
  Filled 2020-09-21: qty 10

## 2020-09-21 MED ORDER — SODIUM CHLORIDE 0.9 % IV SOLN
500.0000 mg/m2 | Freq: Once | INTRAVENOUS | Status: AC
  Administered 2020-09-21: 800 mg via INTRAVENOUS
  Filled 2020-09-21: qty 20

## 2020-09-21 MED ORDER — SODIUM CHLORIDE 0.9 % IV SOLN
Freq: Once | INTRAVENOUS | Status: AC
  Filled 2020-09-21: qty 250

## 2020-09-21 MED ORDER — ZOLEDRONIC ACID 4 MG/100ML IV SOLN
4.0000 mg | Freq: Once | INTRAVENOUS | Status: AC
  Administered 2020-09-21: 4 mg via INTRAVENOUS

## 2020-09-21 MED ORDER — HEPARIN SOD (PORK) LOCK FLUSH 100 UNIT/ML IV SOLN
500.0000 [IU] | Freq: Once | INTRAVENOUS | Status: AC | PRN
Start: 2020-09-21 — End: 2020-09-21
  Administered 2020-09-21: 500 [IU]
  Filled 2020-09-21: qty 5

## 2020-09-21 MED ORDER — ZOLEDRONIC ACID 4 MG/100ML IV SOLN
INTRAVENOUS | Status: AC
  Filled 2020-09-21: qty 100

## 2020-09-21 MED ORDER — PROCHLORPERAZINE MALEATE 10 MG PO TABS
ORAL_TABLET | ORAL | Status: AC
  Filled 2020-09-21: qty 1

## 2020-09-21 MED ORDER — PROCHLORPERAZINE MALEATE 10 MG PO TABS
10.0000 mg | ORAL_TABLET | Freq: Once | ORAL | Status: AC
  Administered 2020-09-21: 10 mg via ORAL

## 2020-09-21 NOTE — Progress Notes (Signed)
Met w/ pt regarding copay assistance for 2022.  Pt would like to re apply so I completed the [employer] enrollmentapplication for Keytruda, gotthe pt's andDr. Dorsey's signature and faxedtodayfor processing.  I will notify the pt of the outcome once received.

## 2020-09-21 NOTE — Patient Instructions (Signed)
[facility] [facility] Discharge Instructions for Patients Receiving Chemotherapy  Today you received the following chemotherapy agents Pembrolizumab (KEYTRUDA) & Pemetrexed (ALIMTA).  To help prevent nausea and vomiting after your treatment, we encourage you to take your nausea medication as prescribed.   If you develop nausea and vomiting that is not controlled by your nausea medication, call the clinic.   BELOW ARE SYMPTOMS THAT SHOULD BE REPORTED IMMEDIATELY:  *FEVER GREATER THAN 100.5 F  *CHILLS WITH OR WITHOUT FEVER  NAUSEA AND VOMITING THAT IS NOT CONTROLLED WITH YOUR NAUSEA MEDICATION  *UNUSUAL SHORTNESS OF BREATH  *UNUSUAL BRUISING OR BLEEDING  TENDERNESS IN MOUTH AND THROAT WITH OR WITHOUT PRESENCE OF ULCERS  *URINARY PROBLEMS  *BOWEL PROBLEMS  UNUSUAL RASH Items with * indicate a potential emergency and should be followed up as soon as possible.  Feel free to call the clinic should you have any questions or concerns. The clinic phone number is [phone].  Please show the [name] at check-in to the Emergency Department and triage nurse.  Zoledronic Acid Injection (Hypercalcemia, Oncology) What is this medicine? ZOLEDRONIC ACID (ZOE le dron ik AS id) slows calcium loss from bones. It high calcium levels in the blood from some kinds of cancer. It may be used in other people at risk for bone loss. This medicine may be used for other purposes; ask your health care provider or pharmacist if you have questions. COMMON BRAND NAME(S): Zometa What should I tell my health care provider before I take this medicine? They need to know if you have any of these conditions:  cancer  dehydration  dental disease  kidney disease  liver disease  low levels of calcium in the blood  lung or breathing disease (asthma)  receiving steroids like dexamethasone or prednisone  an unusual or allergic reaction to zoledronic acid, other medicines, foods, dyes, or  preservatives  pregnant or trying to get pregnant  breast-feeding How should I use this medicine? This drug is injected into a vein. It is given by a health care provider in a hospital or clinic setting. Talk to your health care provider about the use of this drug in children. Special care may be needed. Overdosage: If you think you have taken too much of this medicine contact a poison control center or emergency room at once. NOTE: This medicine is only for you. Do not share this medicine with others. What if I miss a dose? Keep appointments for follow-up doses. It is important not to miss your dose. Call your health care provider if you are unable to keep an appointment. What may interact with this medicine?  certain antibiotics given by injection  NSAIDs, medicines for pain and inflammation, like ibuprofen or naproxen  some diuretics like bumetanide, furosemide  teriparatide  thalidomide This list may not describe all possible interactions. Give your health care provider a list of all the medicines, herbs, non-prescription drugs, or dietary supplements you use. Also tell them if you smoke, drink alcohol, or use illegal drugs. Some items may interact with your medicine. What should I watch for while using this medicine? Visit your health care provider for regular checks on your progress. It may be some time before you see the benefit from this drug. Some people who take this drug have severe bone, joint, or muscle pain. This drug may also increase your risk for jaw problems or a broken thigh bone. Tell your health care provider right away if you have severe pain in  your jaw, bones, joints, or muscles. Tell you health care provider if you have any pain that does not go away or that gets worse. Tell your dentist and dental surgeon that you are taking this drug. You should not have major dental surgery while on this drug. See your dentist to have a dental exam and fix any dental problems  before starting this drug. Take good care of your teeth while on this drug. Make sure you see your dentist for regular follow-up appointments. You should make sure you get enough calcium and vitamin D while you are taking this drug. Discuss the foods you eat and the vitamins you take with your health care provider. Check with your health care provider if you have severe diarrhea, nausea, and vomiting, or if you sweat a lot. The loss of too much body fluid may make it dangerous for you to take this drug. You may need blood work done while you are taking this drug. Do not become pregnant while taking this drug. Women should inform their health care provider if they wish to become pregnant or think they might be pregnant. There is potential for serious harm to an unborn child. Talk to your health care provider for more information. What side effects may I notice from receiving this medicine? Side effects that you should report to your doctor or health care provider as soon as possible:  allergic reactions (skin rash, itching or hives; swelling of the face, lips, or tongue)  bone pain  infection (fever, chills, cough, sore throat, pain or trouble passing urine)  jaw pain, especially after dental work  joint pain  kidney injury (trouble passing urine or change in the amount of urine)  low blood pressure (dizziness; feeling faint or lightheaded, falls; unusually weak or tired)  low calcium levels (fast heartbeat; muscle cramps or pain; pain, tingling, or numbness in the hands or feet; seizures)  low magnesium levels (fast, irregular heartbeat; muscle cramp or pain; muscle weakness; tremors; seizures)  low red blood cell counts (trouble breathing; feeling faint; lightheaded, falls; unusually weak or tired)  muscle pain  redness, blistering, peeling, or loosening of the skin, including inside the mouth  severe diarrhea  swelling of the ankles, feet, hands  trouble breathing Side effects  that usually do not require medical attention (report to your doctor or health care provider if they continue or are bothersome):  anxious  constipation  coughing  depressed mood  eye irritation, itching, or pain  fever  general ill feeling or flu-like symptoms  nausea  pain, redness, or irritation at site where injected  trouble sleeping This list may not describe all possible side effects. Call your doctor for medical advice about side effects. You may report side effects to FDA at 1-800-FDA-1088. Where should I keep my medicine? This drug is given in a hospital or clinic. It will not be stored at home. NOTE: This sheet is a summary. It may not cover all possible information. If you have questions about this medicine, talk to your doctor, pharmacist, or health care provider.  2021 Elsevier/Gold Standard (2019-06-11 09:13:00)

## 2020-09-21 NOTE — Progress Notes (Signed)
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 CT of the head showed showed concerning for metastatic disease involving the left cerebellum and right occipital lobe.  2) 09/30/2019: MRI brain confirms mass within the left cerebellum measures 1.6 x 1.3 x 1.5 cm as well as at least 8 metastatic lesions within the supratentorial and infratentorial brain as outlined 3) 10/01/2019: PET CT scan revealed hypermetabolic infrahilar right lower lobe mass with hypermetabolic nodal metastases in the right hilum, mediastinum, right supraclavicular region, left axilla and lower left neck. 4) 10/08/2019: US guided biopsy of the lymph nodes confirms poorly differentiated non-small cell carcinoma. Immunohistochemistry confirms adenocarcinoma of lung primary. PD-L1 and NGS later revealed a MET Exon 14 mutation and TPS of 90%.  5) 10/12/2019: Establish care with Dr. Lorenso Courier  6) 11/04/2019: Day 1 of capmatinib $RemoveBefor'400mg'aHeQFRzZApyQ$  BID 7) 11/09/2019: presented to Rad/Onc with a DVT in LLE. Seen in symptom management clinic and started on apixaban.  8) 12/29/2019: CT C/A/P showed interval decrease in size of mass involving the superior segment of right lower lobe and reduction in mediastinal and left supraclavicular adenopathy though some small spinal lesions were noted in the interim between the two sets of scans 9) 01/25/2020: temporarily stopped capmatinib due to symptoms of nausea/fatigue. Restarted therapy on 01/30/2020 after brief chemo holiday.  10) 03/22/2020: CT C/A/P reveals a mixed picture, with new lung nodule and  increased lymphadenopathy, however response in bone lesions. D/c capmatinib therapy 11) 04/08/2020: start of Carbo/Pem/Pem for 2nd line treatment. Cycle 1 Day 1   12) 04/29/2020: Cycle 2 Day 1 Carbo/Pem/Pem  13) 05/20/2020: Cycle 3 Day 1 Carbo/Pem/Pem  14) 06/09/2020: Cycle 4 Day 1 Carbo/Pem/Pem  15) 06/29/2020: Cycle 5 Day 1 maintenance Pem/Pem  16) 07/20/2020: Cycle 6 Day 1 maintenance Pem/Pem  17) 08/12/2020: Cycle 7 Day 1 maintenance Pem/Pem  18) 09/01/2020: Cycle 8 Day 1 maintenance Pem/Pem  19) 09/22/2019: Cycle 9 Day 1 maintenance Pem/Pem   Interval History:  SYLVANIA MOSS 65 y.o. female with medical history significant for metastatic adenocarcinoma of the lung presents for a follow up visit. The patient's last visit was on 09/01/2020 prior to the start of Cycle 8 Day 1 of Pem/Pem. In the interim since the last visit she has had no new symptoms. Today is Cycle 9 Day 1.   On exam today Mrs. Inoue is accompanied by her husband. She reports she has been well in the interim since her last visit. She think she may be sleeping more than usual, however she is not having more fatigue or low energy. She does occasionally have some dizziness and some rare queasiness as a result of this. She notes that she is tolerating the amlodipine blood pressure medication well but no new swelling of her lower extremities. Otherwise she has had no issues with nausea, vomiting, or diarrhea. A full 10 point ROS is listed below.   MEDICAL HISTORY:  Past Medical History:  Diagnosis Date  . Anemia   . Anxiety   . Concussion 09/28/2019  . DVT (deep venous thrombosis) ([place]) 2021   L leg  . Dyspnea   . GERD (gastroesophageal  reflux disease)   . Hypercholesterolemia    per pt, she does not have elevated lipids  . Hypertension   . met lung ca dx'd 09/2019   mets to spine, hip and brain  . PONV (postoperative nausea and vomiting)   . Tobacco abuse     SURGICAL HISTORY: Past Surgical History:  Procedure  Laterality Date  . ABDOMINAL HYSTERECTOMY     partial/ left ovaries  . CHOLECYSTECTOMY    . DILATION AND CURETTAGE OF UTERUS    . IR IMAGING GUIDED PORT INSERTION  10/23/2019  . KYPHOPLASTY N/A 03/15/2020   Procedure: Thoracic Eight KYPHOPLASTY;  Surgeon: Erline Levine, MD;  Location: [facility];  Service: Neurosurgery;  Laterality: N/A;  prone   . LIPOMA EXCISION  2018   removed under left breast and right thigh.  . TUBAL LIGATION      ALLERGIES:  has No Known Allergies.  MEDICATIONS:  Current Outpatient Medications  Medication Sig Dispense Refill  . albuterol (VENTOLIN HFA) 108 (90 Base) MCG/ACT inhaler Inhale 2 puffs into the lungs every 6 (six) hours as needed for wheezing or shortness of breath. 6.7 g 11  . ALPRAZolam (XANAX) 0.25 MG tablet Take 1 tablet (0.25 mg total) by mouth 2 (two) times daily as needed. for anxiety (Patient taking differently: Take 0.25 mg by mouth 2 (two) times daily as needed for anxiety.) 60 tablet 5  . amLODipine (NORVASC) 5 MG tablet Take 1 tablet (5 mg total) by mouth daily. 30 tablet 3  . Cyanocobalamin (VITAMIN B12 PO) Take 2 tablets by mouth daily. Gummies    . folic acid (FOLVITE) 1 MG tablet Take 1 tablet (1 mg total) by mouth daily. 90 tablet 1  . furosemide (LASIX) 20 MG tablet 20 mg once daily for 3 days, then stop. May repeat if edema recurs. 30 tablet 1  . lidocaine-prilocaine (EMLA) cream Apply 1 application topically as needed. Apply to port as needed. 30 g 0  . methocarbamol (ROBAXIN) 500 MG tablet Take 500 mg by mouth 3 (three) times daily as needed.    . Multiple Vitamins-Minerals (AIRBORNE PO) Take 1 tablet by mouth daily.     Marland Kitchen ofloxacin (OCUFLOX) 0.3 % ophthalmic solution 2 drops each eye 4 times a day for 5 days 5 mL 1  . OLANZapine (ZYPREXA) 2.5 MG tablet Take 1 tablet (2.5 mg total) by mouth at bedtime. 90 tablet 1  . oxyCODONE (OXY IR/ROXICODONE) 5 MG immediate release tablet Take 1-2 tablets (5-10 mg total) by mouth every 4 (four) hours  as needed for severe pain. 90 tablet 0  . pantoprazole (PROTONIX) 40 MG tablet TAKE ONE TABLET BY MOUTH DAILY 90 tablet 3  . pentoxifylline (TRENTAL) 400 MG CR tablet Take $RemoveBef'400mg'EcyudXSUHi$  tab daily x 1 week, then $RemoveBe'400mg'MKwQTkTxv$  BID; take with food 60 tablet 4  . polyethylene glycol (MIRALAX / GLYCOLAX) 17 g packet Take 17 g by mouth 2 (two) times daily.     . prochlorperazine (COMPAZINE) 10 MG tablet TAKE 1 TABLET BY MOUTH EVERY 6 HOURS AS NEEDED FOR NAUSEA OR VOMITING 30 tablet 0  . sennosides-docusate sodium (SENOKOT-S) 8.6-50 MG tablet Take 2-3 tablets by mouth See admin instructions. 2 in the morning and 3 at night. Adjust as needed    . sucralfate (CARAFATE) 1 g tablet Dissolve 1 tablet in 10 mL H20 and swallow 30 min prior to meals and bedtime. 50 tablet 3  . Vitamin D3 (VITAMIN D) 25 MCG tablet Take 2,000 Units by mouth daily.    Marland Kitchen  vitamin E 180 MG (400 UNITS) capsule Take 400 IU daily x 1 week, then 400 IU BID 60 capsule 4  . XTAMPZA ER 13.5 MG C12A Take 1 pill by mouth every 12 hours 60 capsule 0   No current facility-administered medications for this visit.   Facility-Administered Medications Ordered in Other Visits  Medication Dose Route Frequency Provider Last Rate Last Admin  . sodium chloride flush (NS) 0.9 % injection 10 mL  10 mL Intracatheter PRN Orson Slick, MD   10 mL at 09/21/20 1459    REVIEW OF SYSTEMS:   Constitutional: ( - ) fevers, ( - )  chills , ( - ) night sweats Eyes: ( - ) blurriness of vision, ( - ) double vision, ( - ) watery eyes Ears, nose, mouth, throat, and face: ( - ) mucositis, ( - ) sore throat Respiratory: ( - ) cough, ( - ) dyspnea, ( - ) wheezes Cardiovascular: ( - ) palpitation, ( - ) chest discomfort, ( - ) lower extremity swelling Gastrointestinal:  ( - ) nausea, ( - ) heartburn, ( - ) change in bowel habits Skin: ( - ) abnormal skin rashes Lymphatics: ( - ) new lymphadenopathy, ( - ) easy bruising Neurological: ( - ) numbness, ( - ) tingling, ( - ) new  weaknesses Behavioral/Psych: ( - ) mood change, ( - ) new changes  All other systems were reviewed with the patient and are negative.  PHYSICAL EXAMINATION: ECOG PERFORMANCE STATUS: 1 - Symptomatic but completely ambulatory  Vitals:   09/21/20 1202  BP: (!) 122/91  Pulse: 80  Resp: 20  Temp: (!) 97.4 F (36.3 C)  SpO2: 100%   Filed Weights   09/21/20 1202  Weight: 137 lb 11.2 oz (62.5 kg)    GENERAL: well appearing middle aged Caucasian female in NAD  SKIN: skin color, texture, turgor are normal, no rashes or significant lesions EAR: left ear tympanic membrane mildly erythematous. No discharge or clear signs of bacterial infection.  EYES: conjunctiva are pink and non-injected, sclera clear LUNGS: clear to auscultation and percussion with normal breathing effort HEART: regular rate & rhythm and no murmurs and bilateral + trace pitting lower extremity edema Musculoskeletal: no cyanosis of digits and no clubbing PSYCH: alert & oriented x 3, fluent speech NEURO: no focal motor/sensory deficits   LABORATORY DATA:  I have reviewed the data as listed CBC Latest Ref Rng & Units 09/21/2020 09/01/2020 08/12/2020  WBC 4.0 - 10.5 K/uL 8.7 6.0 4.6  Hemoglobin 12.0 - 15.0 g/dL 11.6(L) 11.7(L) 10.7(L)  Hematocrit 36.0 - 46.0 % 33.1(L) 34.4(L) 31.1(L)  Platelets 150 - 400 K/uL 310 325 259    CMP Latest Ref Rng & Units 09/21/2020 09/01/2020 08/12/2020  Glucose 70 - 99 mg/dL 110(H) 105(H) 129(H)  BUN 8 - 23 mg/dL 10 10 7(L)  Creatinine 0.44 - 1.00 mg/dL 0.68 0.67 0.63  Sodium 135 - 145 mmol/L 140 142 141  Potassium 3.5 - 5.1 mmol/L 3.8 3.8 3.7  Chloride 98 - 111 mmol/L 106 109 107  CO2 22 - 32 mmol/L $RemoveB'26 25 26  'lErqPmFE$ Calcium 8.9 - 10.3 mg/dL 9.1 9.1 8.8(L)  Total Protein 6.5 - 8.1 g/dL 6.8 6.8 6.2(L)  Total Bilirubin 0.3 - 1.2 mg/dL 0.3 0.3 0.3  Alkaline Phos 38 - 126 U/L 73 65 75  AST 15 - 41 U/L 34 35 46(H)  ALT 0 - 44 U/L 56(H) 57(H) 62(H)     RADIOGRAPHIC STUDIES: I personally have  viewed the radiographic studies below: Table disease from prior, no new lesions or new metastatic disease. CT CHEST ABDOMEN PELVIS W CONTRAST  Result Date: 09/16/2020 CLINICAL DATA:  Follow-up metastatic non-small cell lung carcinoma. Ongoing chemotherapy. EXAM: CT CHEST, ABDOMEN, AND PELVIS WITH CONTRAST TECHNIQUE: Multidetector CT imaging of the chest, abdomen and pelvis was performed following the standard protocol during bolus administration of intravenous contrast. CONTRAST:  133mL OMNIPAQUE IOHEXOL 300 MG/ML  SOLN COMPARISON:  06/23/2020 FINDINGS: CT CHEST FINDINGS Cardiovascular: No acute findings. Aortic atherosclerotic calcification noted. Stable mild diffuse pericardial thickening, without evidence of effusion. Mediastinum/Lymph Nodes: No masses or pathologically enlarged lymph nodes identified. Lungs/Pleura: Post radiation changes are again seen in the medial right lower lobe. Multiple bilateral ground-glass nodules are again seen in both lungs, which show no significant change compared to prior exam. No suspicious solid nodules or masses identified. No evidence of acute infiltrate or pleural effusion. Musculoskeletal: A few small sclerotic bone lesions in the mid and lower thoracic vertebra as well as changes of vertebroplasty remain stable. No new bone lesions identified. CT ABDOMEN AND PELVIS FINDINGS Hepatobiliary: No masses identified. Prior cholecystectomy. No evidence of biliary obstruction. Pancreas:  No mass or inflammatory changes. Spleen:  Within normal limits in size and appearance. Adrenals/Urinary tract:  No masses or hydronephrosis. Stomach/Bowel: No evidence of obstruction, inflammatory process, or abnormal fluid collections. Vascular/Lymphatic: No pathologically enlarged lymph nodes identified. No abdominal aortic aneurysm. Aortic atherosclerotic calcification noted. Reproductive: Prior hysterectomy noted. Adnexal regions are unremarkable in appearance. Other:  None. Musculoskeletal:  Sclerotic bone lesions involving the lumbar spine and pelvis remains stable. No new or enlarging bone lesions identified. IMPRESSION: No evidence of soft tissue metastatic disease within the chest, abdomen or pelvis. Stable sclerotic bone metastases. No new or progressive metastatic disease identified. Stable bilateral ground-glass pulmonary nodules. Recommend continued attention chest on follow-up imaging. Aortic Atherosclerosis (ICD10-I70.0). Electronically Signed   By: Marlaine Hind M.D.   On: 09/16/2020 17:33    ASSESSMENT & PLAN Virginia [name] 65 y.o. female with medical history significant for metastatic adenocarcinoma of the lung presents for a follow up visit. 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   Today Mrs. Avilla reports she is currently stable at her baseline.  Her pain continues to be under good control and she is not having any new symptoms at this time. Overall she is willing and able to proceed with chemotherapy had no reservations about moving forward.  She is also not demonstrating any symptoms of recurrent VTE or signs or symptoms concerning for worsening brain bleed.  Previously we discussed Carboplatin/Pembrolizumab/Pemetrexed and the expected side effect from these medications.  We discussed the possible side effects of immunotherapy including colitis, hepatitis, dermatitis, and pneumonitis.  Additionally we discussed the side effects of carboplatin which include suppression of blood counts, nephrotoxicity, and nausea.  Additionally we discussed pemetrexed which can also have effects on counts and would require supplementation with vitamin I69 and folic acid.  She voiced her understanding of these side effects and treatment plans moving forward.  I also noted that we would be able to drop the chemotherapy agents that she had intolerance to continue with pembrolizumab alone given her high TPS score.  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 --port in place, patient completed chemotherapy education.  --plan for repeat CT C/A/P in April 2022 (3 months from last scan in Jan 2022) --supportive therapy as listed below.  --RTC q 3 weeks for continued monitoring/chemotherapy treatments.    #Nausea/Vomiting -- provided patient with Zofran 4-8mg  q8h PRN and compazine 10mg  q6H for breakthrough PRN. These have not been required since olanzapine was added --continue olanzpaine 2.5mg  PO QHS to help with nausea. Marked improvement since the addition of this medication on 01/29/2020.   --continue to monitor   #Hypokalemia, stable --likely 2/2 to decreased PO intake, hypovolemia, and BP medications --given her decreased BP would recommend holding losartan and hydrochlorothiazide --continue to monitor  #Pain Control, improving --continue oxycodone 5mg  q4H PRN. Can increase dose to 10mg  q6H if pain is severe. She is taking approximately 2-3 pills per day.  --continue Xtampza for long acting support.  --continue to take with Senokot to prevent opioid induced constipation. 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  --nausea as above   #Headaches, resolved --appreciate the assistance of Dr. 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 --radiation therapy to the brain completed on 11/16/2019.  --MRI brain on 04/29/2020 showed evidence of small brain bleed. Eliquis therapy was discontinued.  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    No orders of the defined types were placed in this encounter.  All questions were answered. The patient knows to call the clinic with any problems, questions or concerns.  A total of more than 30 minutes were spent on this encounter and over half of that time was spent on counseling and coordination of care as outlined above.   Ledell Peoples, MD Department of Hematology/Oncology [place] [facility] Phone: [phone] Pager: [phone] Email: [email or web address]  09/21/2020 6:08 PM   Literature Support:  Lorenza Chick, Rodrguez-Abreu D, Duffy Rhody, Felip E, De Angelis F, Domine M, [place], Hochmair MJ, [place], [place], Bischoff HG, [place], Grossi F, [place], Reck M, [place], [place], [place], Rubio-Viqueira B, Novello S, Kurata T, Gray JE, Vida J, Wei Z, Yang J, Raftopoulos H, [place], [place] [employer]; [id number] Investigators. Pembrolizumab plus Chemotherapy in Metastatic Non-Small-Cell Lung Cancer. [place] Med. 2018 May 31;378(22):2078-2092.  --Median progression-free survival was 8.8 months (95% CI, 7.6 to 9.2) in the pembrolizumab-combination group and 4.9 months (95% CI, 4.7 to 5.5) in the placebo-combination group (hazard ratio for disease progression or death, 0.52; 95% CI, 0.43 to 0.64; P<0.001). Adverse events of grade 3 or higher occurred in 67.2% of the patients in the pembrolizumab-combination group and in 65.8% of those in the placebo-combination group.  Jodene Nam L. Efficacy of pemetrexed-based regimens in advanced non-small cell lung cancer patients with activating epidermal growth factor receptor mutations after tyrosine kinase inhibitor failure: a systematic review. Onco Targets Ther. 2018;11:2121-2129.   --The weighted median PFS, median OS, and ORR for patients treated with pem regimens were 5.09 months, 15.91 months, and 30.19%, respectively. Our systematic review results showed a  favorable efficacy profile of pem regimens in NSCLC patients with EGFR mutation after EGFR-TKI failure.

## 2020-09-21 NOTE — Patient Instructions (Signed)

## 2020-09-22 ENCOUNTER — Other Ambulatory Visit: Payer: Self-pay | Admitting: Hematology and Oncology

## 2020-09-22 MED FILL — FOLIC ACID 1 MG TABS: 1 | 90 days supply | Qty: 90 | Fill #0

## 2020-09-24 MED FILL — AMLODIPINE BESYLATE 5 MG TA: 5 | 30 days supply | Qty: 30 | Fill #1

## 2020-09-29 ENCOUNTER — Telehealth: Payer: Self-pay

## 2020-09-29 MED ORDER — DEXAMETHASONE 4 MG PO TABS: 4.0000 mg | ORAL_TABLET | Freq: Every day | ORAL | 0 refills | Status: DC

## 2020-09-29 NOTE — Telephone Encounter (Signed)
Will call in dexamethasone 4mg  daily x7 days.    Can we set up a follow up phone visit for next Thursday 10/06/20 to assess response to intervention?  Ventura Sellers, MD

## 2020-09-29 NOTE — Addendum Note (Signed)
Addended by: Ventura Sellers on: 09/29/2020 11:58 AM   Modules accepted: Orders

## 2020-09-29 NOTE — Telephone Encounter (Signed)
Patient's husband called stating patient has been having headaches every couple of days, primarily starting in the AM but will either go away on their own or will resolved after patient takes OTC Tylenol.  Patient has also had some dizziness when standing, but when she sits/lays down, the dizziness goes away.  Patient has MRI scan on 11/04/20 and wants to know if she should be prescribed a steroid prior to the scan for the headaches/dizziness. Please advise.

## 2020-09-29 NOTE — Telephone Encounter (Signed)
Called and informed patient's husband of dexamethasone prescription and need for follow-up on 10/06/20. Patient agreeable to phone visit with Dr. Mickeal Skinner for follow-up on 10/06/20 at [facility]. Scheduling message sent.

## 2020-10-06 ENCOUNTER — Other Ambulatory Visit: Payer: Self-pay | Admitting: Radiation Therapy

## 2020-10-06 ENCOUNTER — Encounter: Payer: Self-pay | Admitting: Hematology and Oncology

## 2020-10-06 ENCOUNTER — Inpatient Hospital Stay (HOSPITAL_BASED_OUTPATIENT_CLINIC_OR_DEPARTMENT_OTHER): Payer: BC Managed Care – PPO | Admitting: Internal Medicine

## 2020-10-06 DIAGNOSIS — C7931 Secondary malignant neoplasm of brain: Secondary | ICD-10-CM

## 2020-10-06 NOTE — Progress Notes (Signed)
I connected with Virginia [name] on 10/06/20 at 10:00 AM EST by telephone visit and verified that I am speaking with the correct person using two identifiers.  I discussed the limitations, risks, security and privacy concerns of performing an evaluation and management service by telemedicine and the availability of in-person appointments. I also discussed with the patient that there may be a patient responsible charge related to this service. The patient expressed understanding and agreed to proceed.  Other persons participating in the visit and their role in the encounter:  n/a  Patient's location:  Home  Provider's location:  Office  Chief Complaint:  Brain metastases [facility])  History of Present Ilness: [name] describes improvement in headache symptoms since dosing the decadron 4mg  daily.  She still describes occasional dizziness, which is always positional and self limited.  No issues with gait or worsening of balance as prior.  No apparent side effects with the decadron. Observations: Language and cognition at baseline Assessment and Plan: Brain metastases (HCC) Breakthrough headache syndrome, dizziness may be secondary to ongoing cerebral edema as effect from prior radiation.   Good tolerance of short course of dex, can discontinue if tolerated. If symptoms recur, can resume lower dose 2mg  daily and taper down slowly   Follow Up Instructions: RTC in February as scheduled following MRI brain  I discussed the assessment and treatment plan with the patient.  The patient was provided an opportunity to ask questions and all were answered.  The patient agreed with the plan and demonstrated understanding of the instructions.    The patient was advised to call back or seek an in-person evaluation if the symptoms worsen or if the condition fails to improve as anticipated.  I provided 5-10 minutes of non-face-to-face time during this enocunter.  Ventura Sellers, MD   I provided 15  minutes of non face-to-face telephone visit time during this encounter, and > 50% was spent counseling as documented under my assessment & plan.

## 2020-10-06 NOTE — Progress Notes (Signed)
Pthas been re enrolledw/ the [employer] program for [place] for $25,000 from 09/10/20- 09/09/21.Her copay for Beryle Flock will be $25.

## 2020-10-13 ENCOUNTER — Inpatient Hospital Stay: Payer: BC Managed Care – PPO | Attending: Hematology and Oncology | Admitting: Hematology and Oncology

## 2020-10-13 ENCOUNTER — Other Ambulatory Visit: Payer: Self-pay

## 2020-10-13 ENCOUNTER — Inpatient Hospital Stay: Payer: BC Managed Care – PPO

## 2020-10-13 ENCOUNTER — Other Ambulatory Visit: Payer: Self-pay | Admitting: Internal Medicine

## 2020-10-13 VITALS — BP 115/88 | HR 92 | Temp 97.4°F | Resp 18 | Ht 62.0 in | Wt 133.1 lb

## 2020-10-13 DIAGNOSIS — C7951 Secondary malignant neoplasm of bone: Secondary | ICD-10-CM | POA: Insufficient documentation

## 2020-10-13 DIAGNOSIS — R2689 Other abnormalities of gait and mobility: Secondary | ICD-10-CM | POA: Insufficient documentation

## 2020-10-13 DIAGNOSIS — E876 Hypokalemia: Secondary | ICD-10-CM | POA: Insufficient documentation

## 2020-10-13 DIAGNOSIS — C7931 Secondary malignant neoplasm of brain: Secondary | ICD-10-CM | POA: Diagnosis not present

## 2020-10-13 DIAGNOSIS — C3491 Malignant neoplasm of unspecified part of right bronchus or lung: Secondary | ICD-10-CM | POA: Diagnosis not present

## 2020-10-13 DIAGNOSIS — R590 Localized enlarged lymph nodes: Secondary | ICD-10-CM | POA: Insufficient documentation

## 2020-10-13 DIAGNOSIS — Z7952 Long term (current) use of systemic steroids: Secondary | ICD-10-CM | POA: Diagnosis not present

## 2020-10-13 DIAGNOSIS — Z86718 Personal history of other venous thrombosis and embolism: Secondary | ICD-10-CM | POA: Diagnosis not present

## 2020-10-13 DIAGNOSIS — Z5112 Encounter for antineoplastic immunotherapy: Secondary | ICD-10-CM | POA: Diagnosis not present

## 2020-10-13 DIAGNOSIS — R918 Other nonspecific abnormal finding of lung field: Secondary | ICD-10-CM

## 2020-10-13 DIAGNOSIS — C3431 Malignant neoplasm of lower lobe, right bronchus or lung: Secondary | ICD-10-CM | POA: Diagnosis not present

## 2020-10-13 DIAGNOSIS — C3490 Malignant neoplasm of unspecified part of unspecified bronchus or lung: Secondary | ICD-10-CM

## 2020-10-13 DIAGNOSIS — Z8249 Family history of ischemic heart disease and other diseases of the circulatory system: Secondary | ICD-10-CM | POA: Insufficient documentation

## 2020-10-13 DIAGNOSIS — Z9049 Acquired absence of other specified parts of digestive tract: Secondary | ICD-10-CM | POA: Insufficient documentation

## 2020-10-13 DIAGNOSIS — Z814 Family history of other substance abuse and dependence: Secondary | ICD-10-CM | POA: Insufficient documentation

## 2020-10-13 DIAGNOSIS — Z79899 Other long term (current) drug therapy: Secondary | ICD-10-CM | POA: Insufficient documentation

## 2020-10-13 DIAGNOSIS — Z95828 Presence of other vascular implants and grafts: Secondary | ICD-10-CM

## 2020-10-13 DIAGNOSIS — R52 Pain, unspecified: Secondary | ICD-10-CM | POA: Diagnosis not present

## 2020-10-13 DIAGNOSIS — R42 Dizziness and giddiness: Secondary | ICD-10-CM | POA: Insufficient documentation

## 2020-10-13 DIAGNOSIS — Z809 Family history of malignant neoplasm, unspecified: Secondary | ICD-10-CM | POA: Diagnosis not present

## 2020-10-13 DIAGNOSIS — Z87891 Personal history of nicotine dependence: Secondary | ICD-10-CM | POA: Diagnosis not present

## 2020-10-13 DIAGNOSIS — Z5111 Encounter for antineoplastic chemotherapy: Secondary | ICD-10-CM | POA: Insufficient documentation

## 2020-10-13 LAB — CBC WITH DIFFERENTIAL (CANCER CENTER ONLY)
Abs Immature Granulocytes: 0.02 10*3/uL (ref 0.00–0.07)
Basophils Absolute: 0 10*3/uL (ref 0.0–0.1)
Basophils Relative: 1 %
Eosinophils Absolute: 0.2 10*3/uL (ref 0.0–0.5)
Eosinophils Relative: 2 %
HCT: 33.8 % — ABNORMAL LOW (ref 36.0–46.0)
Hemoglobin: 11.6 g/dL — ABNORMAL LOW (ref 12.0–15.0)
Immature Granulocytes: 0 %
Lymphocytes Relative: 23 %
Lymphs Abs: 1.7 10*3/uL (ref 0.7–4.0)
MCH: 32.6 pg (ref 26.0–34.0)
MCHC: 34.3 g/dL (ref 30.0–36.0)
MCV: 94.9 fL (ref 80.0–100.0)
Monocytes Absolute: 1.1 10*3/uL — ABNORMAL HIGH (ref 0.1–1.0)
Monocytes Relative: 15 %
Neutro Abs: 4.4 10*3/uL (ref 1.7–7.7)
Neutrophils Relative %: 59 %
Platelet Count: 280 10*3/uL (ref 150–400)
RBC: 3.56 MIL/uL — ABNORMAL LOW (ref 3.87–5.11)
RDW: 12.3 % (ref 11.5–15.5)
WBC Count: 7.3 10*3/uL (ref 4.0–10.5)
nRBC: 0 % (ref 0.0–0.2)

## 2020-10-13 LAB — CMP (CANCER CENTER ONLY)
ALT: 35 U/L (ref 0–44)
AST: 27 U/L (ref 15–41)
Albumin: 3.2 g/dL — ABNORMAL LOW (ref 3.5–5.0)
Alkaline Phosphatase: 73 U/L (ref 38–126)
Anion gap: 6 (ref 5–15)
BUN: 10 mg/dL (ref 8–23)
CO2: 28 mmol/L (ref 22–32)
Calcium: 9.3 mg/dL (ref 8.9–10.3)
Chloride: 108 mmol/L (ref 98–111)
Creatinine: 0.63 mg/dL (ref 0.44–1.00)
GFR, Estimated: >60 mL/min (ref >60)
Glucose, Bld: 95 mg/dL (ref 70–99)
Potassium: 3.8 mmol/L (ref 3.5–5.1)
Sodium: 142 mmol/L (ref 135–145)
Total Bilirubin: 0.2 mg/dL — ABNORMAL LOW (ref 0.3–1.2)
Total Protein: 6.8 g/dL (ref 6.5–8.1)

## 2020-10-13 LAB — TSH: TSH: 0.722 u[IU]/mL (ref 0.308–3.960)

## 2020-10-13 MED ORDER — PROCHLORPERAZINE MALEATE 10 MG PO TABS
10.0000 mg | ORAL_TABLET | Freq: Once | ORAL | Status: AC
  Administered 2020-10-13: 10 mg via ORAL

## 2020-10-13 MED ORDER — SODIUM CHLORIDE 0.9 % IV SOLN
200.0000 mg | Freq: Once | INTRAVENOUS | Status: AC
  Administered 2020-10-13: 200 mg via INTRAVENOUS
  Filled 2020-10-13: qty 8

## 2020-10-13 MED ORDER — DEXAMETHASONE 2 MG PO TABS: 2.0000 mg | ORAL_TABLET | Freq: Every day | ORAL | 0 refills | Status: DC

## 2020-10-13 MED ORDER — SODIUM CHLORIDE 0.9 % IV SOLN
500.0000 mg/m2 | Freq: Once | INTRAVENOUS | Status: AC
  Administered 2020-10-13: 800 mg via INTRAVENOUS
  Filled 2020-10-13: qty 20

## 2020-10-13 MED ORDER — SODIUM CHLORIDE 0.9% FLUSH
10.0000 mL | INTRAVENOUS | Status: DC | PRN
  Administered 2020-10-13: 10 mL
  Filled 2020-10-13: qty 10

## 2020-10-13 MED ORDER — SODIUM CHLORIDE 0.9 % IV SOLN
Freq: Once | INTRAVENOUS | Status: AC
  Filled 2020-10-13: qty 250

## 2020-10-13 MED ORDER — CYANOCOBALAMIN 1000 MCG/ML IJ SOLN
1000.0000 ug | Freq: Once | INTRAMUSCULAR | Status: AC
  Administered 2020-10-13: 1000 ug via INTRAMUSCULAR

## 2020-10-13 MED ORDER — SODIUM CHLORIDE 0.9% FLUSH
10.0000 mL | Freq: Once | INTRAVENOUS | Status: AC
  Administered 2020-10-13: 10 mL via INTRAVENOUS
  Filled 2020-10-13: qty 10

## 2020-10-13 MED ORDER — CYANOCOBALAMIN 1000 MCG/ML IJ SOLN
INTRAMUSCULAR | Status: AC
  Filled 2020-10-13: qty 1

## 2020-10-13 MED ORDER — PROCHLORPERAZINE MALEATE 10 MG PO TABS
ORAL_TABLET | ORAL | Status: AC
  Filled 2020-10-13: qty 1

## 2020-10-13 MED ORDER — HEPARIN SOD (PORK) LOCK FLUSH 100 UNIT/ML IV SOLN
500.0000 [IU] | Freq: Once | INTRAVENOUS | Status: AC | PRN
  Administered 2020-10-13: 500 [IU]
  Filled 2020-10-13: qty 5

## 2020-10-13 NOTE — Patient Instructions (Signed)

## 2020-10-13 NOTE — Progress Notes (Signed)
Pt states she has been off Dex since last Wed. Will proceed w/ Pembrolizumab.  Kennith Center, Pharm.D., CPP 10/13/2020@12 :35 PM

## 2020-10-14 ENCOUNTER — Telehealth: Payer: Self-pay | Admitting: Internal Medicine

## 2020-10-14 NOTE — Telephone Encounter (Signed)
Called pt per 2/3 sch msg - left message for pt with appt date and time

## 2020-10-16 ENCOUNTER — Encounter: Payer: Self-pay | Admitting: Hematology and Oncology

## 2020-10-16 NOTE — Progress Notes (Signed)
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 CT of the head showed showed concerning for metastatic disease involving the left cerebellum and right occipital lobe.  2) 09/30/2019: MRI brain confirms mass within the left cerebellum measures 1.6 x 1.3 x 1.5 cm as well as at least 8 metastatic lesions within the supratentorial and infratentorial brain as outlined 3) 10/01/2019: PET CT scan revealed hypermetabolic infrahilar right lower lobe mass with hypermetabolic nodal metastases in the right hilum, mediastinum, right supraclavicular region, left axilla and lower left neck. 4) 10/08/2019: US guided biopsy of the lymph nodes confirms poorly differentiated non-small cell carcinoma. Immunohistochemistry confirms adenocarcinoma of lung primary. PD-L1 and NGS later revealed a MET Exon 14 mutation and TPS of 90%.  5) 10/12/2019: Establish care with Dr. Lorenso Courier  6) 11/04/2019: Day 1 of capmatinib 416m BID 7) 11/09/2019: presented to Rad/Onc with a DVT in LLE. Seen in symptom management clinic and started on apixaban.  8) 12/29/2019: CT C/A/P showed interval decrease in size of mass involving the superior segment of right lower lobe and reduction in mediastinal and left supraclavicular adenopathy though some small spinal lesions were noted in the interim between the two sets of scans 9) 01/25/2020: temporarily stopped capmatinib due to symptoms of nausea/fatigue. Restarted therapy on 01/30/2020 after brief chemo holiday.  10) 03/22/2020: CT C/A/P reveals a mixed picture, with new lung nodule and  increased lymphadenopathy, however response in bone lesions. D/c capmatinib therapy 11) 04/08/2020: start of Carbo/Pem/Pem for 2nd line treatment. Cycle 1 Day 1   12) 04/29/2020: Cycle 2 Day 1 Carbo/Pem/Pem  13) 05/20/2020: Cycle 3 Day 1 Carbo/Pem/Pem  14) 06/09/2020: Cycle 4 Day 1 Carbo/Pem/Pem  15) 06/29/2020: Cycle 5 Day 1 maintenance Pem/Pem  16) 07/20/2020: Cycle 6 Day 1 maintenance Pem/Pem  17) 08/12/2020: Cycle 7 Day 1 maintenance Pem/Pem  18) 09/01/2020: Cycle 8 Day 1 maintenance Pem/Pem  19) 09/21/2020: Cycle 9 Day 1 maintenance Pem/Pem  20) 10/13/2020: Cycle 10 Day 1 maintenance Pem/Pem   Interval History:  [name]89y.o. female with medical history significant for metastatic adenocarcinoma of the lung presents for a follow up visit. The patient's last visit was on 09/21/2020 prior to the start of Cycle 9 Day 1 of Pem/Pem. In the interim since the last visit she has had no new symptoms. Today is Cycle 10 Day 1.   On exam today Virginia [name] is accompanied by her husband. She reports she has been well in the interim since her last visit.  She reports that she is been having some issues with headaches as well as some lightheadedness and dizziness when she was discontinued from the steroid therapy.  After discussion with Dr. VMickeal Skinnershe will be restarting 2 mg of Dex p.o. daily.  She notes that otherwise her pain has been well controlled [facility] and oxycodones as needed 2-3 times per day. Otherwise she has had no issues with nausea, vomiting, or diarrhea. A full 10 point ROS is listed below.   MEDICAL HISTORY:  Past Medical History:  Diagnosis Date  . Anemia   . Anxiety   .  Concussion 09/28/2019  . DVT (deep venous thrombosis) ([place]) 2021   L leg  . Dyspnea   . GERD (gastroesophageal reflux disease)   . Hypercholesterolemia    per pt, she does not have elevated lipids  . Hypertension   . met lung ca dx'd 09/2019   mets to spine, hip and brain  . PONV  (postoperative nausea and vomiting)   . Tobacco abuse     SURGICAL HISTORY: Past Surgical History:  Procedure Laterality Date  . ABDOMINAL HYSTERECTOMY     partial/ left ovaries  . CHOLECYSTECTOMY    . DILATION AND CURETTAGE OF UTERUS    . IR IMAGING GUIDED PORT INSERTION  10/23/2019  . KYPHOPLASTY N/A 03/15/2020   Procedure: Thoracic Eight KYPHOPLASTY;  Surgeon: Erline Levine, MD;  Location: [facility];  Service: Neurosurgery;  Laterality: N/A;  prone   . LIPOMA EXCISION  2018   removed under left breast and right thigh.  . TUBAL LIGATION      ALLERGIES:  has No Known Allergies.  MEDICATIONS:  Current Outpatient Medications  Medication Sig Dispense Refill  . albuterol (VENTOLIN HFA) 108 (90 Base) MCG/ACT inhaler Inhale 2 puffs into the lungs every 6 (six) hours as needed for wheezing or shortness of breath. 6.7 g 11  . ALPRAZolam (XANAX) 0.25 MG tablet Take 1 tablet (0.25 mg total) by mouth 2 (two) times daily as needed. for anxiety (Patient taking differently: Take 0.25 mg by mouth 2 (two) times daily as needed for anxiety.) 60 tablet 5  . amLODipine (NORVASC) 5 MG tablet Take 1 tablet (5 mg total) by mouth daily. 30 tablet 3  . Cyanocobalamin (VITAMIN B12 PO) Take 2 tablets by mouth daily. Gummies    . dexamethasone (DECADRON) 2 MG tablet Take 1 tablet (2 mg total) by mouth daily. 30 tablet 0  . folic acid (FOLVITE) 1 MG tablet TAKE 1 TABLET BY MOUTH DAILY. 90 tablet 1  . furosemide (LASIX) 20 MG tablet 20 mg once daily for 3 days, then stop. May repeat if edema recurs. 30 tablet 1  . lidocaine-prilocaine (EMLA) cream Apply 1 application topically as needed. Apply to port as needed. 30 g 0  . methocarbamol (ROBAXIN) 500 MG tablet Take 500 mg by mouth 3 (three) times daily as needed.    . Multiple Vitamins-Minerals (AIRBORNE PO) Take 1 tablet by mouth daily.     Marland Kitchen ofloxacin (OCUFLOX) 0.3 % ophthalmic solution 2 drops each eye 4 times a day for 5 days 5 mL 1  . OLANZapine (ZYPREXA) 2.5  MG tablet Take 1 tablet (2.5 mg total) by mouth at bedtime. 90 tablet 1  . oxyCODONE (OXY IR/ROXICODONE) 5 MG immediate release tablet Take 1-2 tablets (5-10 mg total) by mouth every 4 (four) hours as needed for severe pain. 90 tablet 0  . pantoprazole (PROTONIX) 40 MG tablet TAKE ONE TABLET BY MOUTH DAILY 90 tablet 3  . pentoxifylline (TRENTAL) 400 MG CR tablet Take 447m tab daily x 1 week, then 4061mBID; take with food 60 tablet 4  . polyethylene glycol (MIRALAX / GLYCOLAX) 17 g packet Take 17 g by mouth 2 (two) times daily.     . prochlorperazine (COMPAZINE) 10 MG tablet TAKE 1 TABLET BY MOUTH EVERY 6 HOURS AS NEEDED FOR NAUSEA OR VOMITING 30 tablet 0  . sennosides-docusate sodium (SENOKOT-S) 8.6-50 MG tablet Take 2-3 tablets by mouth See admin instructions. 2 in the morning and 3 at night. Adjust as needed    . sucralfate (  CARAFATE) 1 g tablet Dissolve 1 tablet in 10 mL H20 and swallow 30 min prior to meals and bedtime. 50 tablet 3  . Vitamin D3 (VITAMIN D) 25 MCG tablet Take 2,000 Units by mouth daily.    . vitamin E 180 MG (400 UNITS) capsule Take 400 IU daily x 1 week, then 400 IU BID 60 capsule 4  . XTAMPZA ER 13.5 MG C12A Take 1 pill by mouth every 12 hours 60 capsule 0   No current facility-administered medications for this visit.    REVIEW OF SYSTEMS:   Constitutional: ( - ) fevers, ( - )  chills , ( - ) night sweats Eyes: ( - ) blurriness of vision, ( - ) double vision, ( - ) watery eyes Ears, nose, mouth, throat, and face: ( - ) mucositis, ( - ) sore throat Respiratory: ( - ) cough, ( - ) dyspnea, ( - ) wheezes Cardiovascular: ( - ) palpitation, ( - ) chest discomfort, ( - ) lower extremity swelling Gastrointestinal:  ( - ) nausea, ( - ) heartburn, ( - ) change in bowel habits Skin: ( - ) abnormal skin rashes Lymphatics: ( - ) new lymphadenopathy, ( - ) easy bruising Neurological: ( - ) numbness, ( - ) tingling, ( - ) new weaknesses Behavioral/Psych: ( - ) mood change, ( - )  new changes  All other systems were reviewed with the patient and are negative.  PHYSICAL EXAMINATION: ECOG PERFORMANCE STATUS: 1 - Symptomatic but completely ambulatory  Vitals:   10/13/20 1125  BP: 115/88  Pulse: 92  Resp: 18  Temp: (!) 97.4 F (36.3 C)  SpO2: 100%   Filed Weights   10/13/20 1125  Weight: 133 lb 1.6 oz (60.4 kg)    GENERAL: well appearing middle aged Caucasian female in NAD  SKIN: skin color, texture, turgor are normal, no rashes or significant lesions EAR: left ear tympanic membrane mildly erythematous. No discharge or clear signs of bacterial infection.  EYES: conjunctiva are pink and non-injected, sclera clear LUNGS: clear to auscultation and percussion with normal breathing effort HEART: regular rate & rhythm and no murmurs and bilateral + trace pitting lower extremity edema Musculoskeletal: no cyanosis of digits and no clubbing PSYCH: alert & oriented x 3, fluent speech NEURO: no focal motor/sensory deficits   LABORATORY DATA:  I have reviewed the data as listed CBC Latest Ref Rng & Units 10/13/2020 09/21/2020 09/01/2020  WBC 4.0 - 10.5 K/uL 7.3 8.7 6.0  Hemoglobin 12.0 - 15.0 g/dL 11.6(L) 11.6(L) 11.7(L)  Hematocrit 36.0 - 46.0 % 33.8(L) 33.1(L) 34.4(L)  Platelets 150 - 400 K/uL 280 310 325    CMP Latest Ref Rng & Units 10/13/2020 09/21/2020 09/01/2020  Glucose 70 - 99 mg/dL 95 110(H) 105(H)  BUN 8 - 23 mg/dL _0 Creatinine 0.44 - 1.00 mg/dL 0.63 0.68 0.67  Sodium 135 - 145 mmol/L 142 140 142  Potassium 3.5 - 5.1 mmol/L 3.8 3.8 3.8  Chloride 98 - 111 mmol/L 108 106 109  CO2 22 - 32 mmol/L _1 Calcium 8.9 - 10.3 mg/dL 9.3 9.1 9.1  Total Protein 6.5 - 8.1 g/dL 6.8 6.8 6.8  Total Bilirubin 0.3 - 1.2 mg/dL 0.2(L) 0.3 0.3  Alkaline Phos 38 - 126 U/L 73 73 65  AST 15 - 41 U/L 27 34 35  ALT 0 - 44 U/L 35 56(H) 57(H)     RADIOGRAPHIC STUDIES: I personally have viewed the radiographic studies below:  Table disease from prior, no new  lesions or new metastatic disease. No results found.  ASSESSMENT & PLAN Virginia [name] 65 y.o. female with medical history significant for metastatic adenocarcinoma of the lung presents for a follow up visit. 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   Today Mrs. Gulledge reports she is currently stable at her baseline.  Her pain continues to be under good control and she is not having any new symptoms at this time. Overall she is willing and able to proceed with chemotherapy had no reservations about moving forward.  She is also not demonstrating any symptoms of recurrent VTE or signs or symptoms concerning for worsening brain bleed.  Previously we discussed Carboplatin/Pembrolizumab/Pemetrexed and the expected side effect from these medications.  We discussed the possible side effects of immunotherapy including colitis, hepatitis, dermatitis, and pneumonitis.  Additionally we discussed the side effects of carboplatin which include suppression of blood counts, nephrotoxicity, and nausea.  Additionally we discussed pemetrexed which can also have effects on counts and would require supplementation with vitamin Y65 and folic acid.  She voiced her understanding of these side effects and treatment plans moving forward.  I also noted that we would be able to drop the chemotherapy agents that she had intolerance to continue with pembrolizumab alone given her high TPS score.  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 --port in place, patient completed chemotherapy education.  --plan for repeat CT C/A/P in April 2022 (3 months from last scan in Jan 2022) --supportive therapy as listed below.  --RTC q 3 weeks for continued  monitoring/chemotherapy treatments.    #Nausea/Vomiting -- provided patient with Zofran 4-40m q8h PRN and compazine 144mq6H for breakthrough PRN. These have not been required since olanzapine was added --continue olanzpaine 2.75m36mO QHS to help with nausea. Marked improvement since the addition of this medication on 01/29/2020.   --continue to monitor   #Hypokalemia, stable --likely 2/2 to decreased PO intake, hypovolemia, and BP medications --given her decreased BP would recommend holding losartan and hydrochlorothiazide --continue to monitor  #Pain Control, improving --continue oxycodone 75mg82mH PRN. Can increase dose to 10mg2m if pain is severe. She is taking approximately 2-3 pills per day.  --continue Xtampza ER BID for long acting support.  --continue to take with Senokot to prevent opioid induced constipation. 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  --nausea as above   #Headaches, resolved --appreciate the assistance of Dr. VasloMickeal Skinnerreating her brain metastasis and symptoms. --radiation therapy to the brain completed on 11/16/2019.  --MRI brain on 04/29/2020 showed evidence of small brain bleed. Eliquis therapy was discontinued.  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    No orders of the defined types were placed in this encounter.  All questions were answered. The patient knows to call the clinic with any problems, questions or concerns.  A total of more than 30 minutes were spent on this encounter and  over half of that time was spent on counseling and coordination of care as outlined above.   Ledell Peoples, MD Department of Hematology/Oncology [place] [facility] Phone:  [phone] Pager: [phone] Email: [email or web address]  10/16/2020 5:22 PM   Literature Support:  Lorenza Chick, Rodrguez-Abreu D, Duffy Rhody, Felip E, De Angelis F, Domine M, [place], Hochmair MJ, [place], [place], Bischoff HG, [place], Grossi F, [place], Reck M, [place], [place], [place], Rubio-Viqueira B, Novello S, Kurata T, Gray JE, Vida J, Wei Z, Yang J, Raftopoulos H, [place], [place] [employer]; [id number] Investigators. Pembrolizumab plus Chemotherapy in Metastatic Non-Small-Cell Lung Cancer. [employer] Med. 2018 May 31;378(22):2078-2092.  --Median progression-free survival was 8.8 months (95% CI, 7.6 to 9.2) in the pembrolizumab-combination group and 4.9 months (95% CI, 4.7 to 5.5) in the placebo-combination group (hazard ratio for disease progression or death, 0.52; 95% CI, 0.43 to 0.64; P<0.001). Adverse events of grade 3 or higher occurred in 67.2% of the patients in the pembrolizumab-combination group and in 65.8% of those in the placebo-combination group.  Jodene Nam L. Efficacy of pemetrexed-based regimens in advanced non-small cell lung cancer patients with activating epidermal growth factor receptor mutations after tyrosine kinase inhibitor failure: a systematic review. Onco Targets Ther. 2018;11:2121-2129.   --The weighted median PFS, median OS, and ORR for patients treated with pem regimens were 5.09 months, 15.91 months, and 30.19%, respectively. Our systematic review results showed a favorable efficacy profile of pem regimens in NSCLC patients with EGFR mutation after EGFR-TKI failure.

## 2020-10-18 ENCOUNTER — Other Ambulatory Visit: Payer: Self-pay | Admitting: Internal Medicine

## 2020-10-18 DIAGNOSIS — Z1231 Encounter for screening mammogram for malignant neoplasm of breast: Secondary | ICD-10-CM

## 2020-10-20 ENCOUNTER — Other Ambulatory Visit: Payer: Self-pay | Admitting: Hematology and Oncology

## 2020-10-20 MED ORDER — OXYCODONE HCL 5 MG PO TABS
5.0000 mg | ORAL_TABLET | ORAL | 0 refills | Status: DC | PRN
Start: 2020-10-20 — End: 2020-10-20

## 2020-10-20 MED ORDER — XTAMPZA ER 13.5 MG PO C12A: EXTENDED_RELEASE_CAPSULE | ORAL | 0 refills | Status: DC

## 2020-10-20 MED FILL — XTAMPZA ER 13.5 MG C12A: 13.5 | 30 days supply | Qty: 60 | Fill #0

## 2020-10-20 MED FILL — oxyCODONE HCL 5 MG TABS: 5 | 8 days supply | Qty: 90 | Fill #0

## 2020-10-21 MED FILL — PENTOXIFYLLINE 400 MG TAB S: 400 | 30 days supply | Qty: 60 | Fill #0

## 2020-10-24 MED FILL — AMLODIPINE BESYLATE 5 MG TA: 5 | 30 days supply | Qty: 30 | Fill #2

## 2020-10-27 ENCOUNTER — Ambulatory Visit: Payer: BC Managed Care – PPO | Admitting: Internal Medicine

## 2020-10-27 ENCOUNTER — Inpatient Hospital Stay (HOSPITAL_BASED_OUTPATIENT_CLINIC_OR_DEPARTMENT_OTHER): Payer: BC Managed Care – PPO | Admitting: Internal Medicine

## 2020-10-27 DIAGNOSIS — C7931 Secondary malignant neoplasm of brain: Secondary | ICD-10-CM | POA: Diagnosis not present

## 2020-10-27 NOTE — Progress Notes (Signed)
I connected with Virginia [name] on 10/27/20 at  2:30 PM EST by telephone visit and verified that I am speaking with the correct person using two identifiers.  I discussed the limitations, risks, security and privacy concerns of performing an evaluation and management service by telemedicine and the availability of in-person appointments. I also discussed with the patient that there may be a patient responsible charge related to this service. The patient expressed understanding and agreed to proceed.  Other persons participating in the visit and their role in the encounter:  husband  Patient's location:  home  Provider's location:  Office  Chief Complaint:  Brain metastases [facility])  History of Present Ilness: Virginia [name] describes improvement in balance, dizziness, headaches and appetite since resuming the decadron at 2mg  daily on 10/13/20.  Does contineus to experience some occasional dizziness. Denies new or progressive neurologic deficits.  Continues on carbo/pem/pem with Dr Lorenso Courier.   Observations: Language and cognition at baseline Assessment and Plan: Brain metastases ([place])  Recommend continuing decadron at 2mg  daily.  Plans to obtain brain MRI 2/25, we will follow with tumor board review as prior, with particular attention to progressive posterior fossa lesion Follow Up Instructions: RTC after MRI as scheduled  I discussed the assessment and treatment plan with the patient.  The patient was provided an opportunity to ask questions and all were answered.  The patient agreed with the plan and demonstrated understanding of the instructions.    The patient was advised to call back or seek an in-person evaluation if the symptoms worsen or if the condition fails to improve as anticipated.  I provided 5-10 minutes of non-face-to-face time during this enocunter.  Ventura Sellers, MD   I provided 15 minutes of non face-to-face telephone visit time during this encounter, and > 50% was spent  counseling as documented under my assessment & plan.

## 2020-11-03 ENCOUNTER — Other Ambulatory Visit: Payer: Self-pay

## 2020-11-03 ENCOUNTER — Inpatient Hospital Stay: Payer: BC Managed Care – PPO

## 2020-11-03 ENCOUNTER — Inpatient Hospital Stay (HOSPITAL_BASED_OUTPATIENT_CLINIC_OR_DEPARTMENT_OTHER): Payer: BC Managed Care – PPO | Admitting: Hematology and Oncology

## 2020-11-03 VITALS — BP 133/98 | HR 89 | Temp 97.8°F | Resp 14 | Ht 62.0 in | Wt 140.4 lb

## 2020-11-03 DIAGNOSIS — Z5111 Encounter for antineoplastic chemotherapy: Secondary | ICD-10-CM | POA: Diagnosis not present

## 2020-11-03 DIAGNOSIS — C7931 Secondary malignant neoplasm of brain: Secondary | ICD-10-CM

## 2020-11-03 DIAGNOSIS — R42 Dizziness and giddiness: Secondary | ICD-10-CM | POA: Diagnosis not present

## 2020-11-03 DIAGNOSIS — Z86718 Personal history of other venous thrombosis and embolism: Secondary | ICD-10-CM | POA: Diagnosis not present

## 2020-11-03 DIAGNOSIS — Z8249 Family history of ischemic heart disease and other diseases of the circulatory system: Secondary | ICD-10-CM | POA: Diagnosis not present

## 2020-11-03 DIAGNOSIS — Z95828 Presence of other vascular implants and grafts: Secondary | ICD-10-CM

## 2020-11-03 DIAGNOSIS — R2689 Other abnormalities of gait and mobility: Secondary | ICD-10-CM | POA: Diagnosis not present

## 2020-11-03 DIAGNOSIS — C3490 Malignant neoplasm of unspecified part of unspecified bronchus or lung: Secondary | ICD-10-CM

## 2020-11-03 DIAGNOSIS — R52 Pain, unspecified: Secondary | ICD-10-CM | POA: Diagnosis not present

## 2020-11-03 DIAGNOSIS — Z7952 Long term (current) use of systemic steroids: Secondary | ICD-10-CM | POA: Diagnosis not present

## 2020-11-03 DIAGNOSIS — C7951 Secondary malignant neoplasm of bone: Secondary | ICD-10-CM | POA: Diagnosis not present

## 2020-11-03 DIAGNOSIS — R590 Localized enlarged lymph nodes: Secondary | ICD-10-CM | POA: Diagnosis not present

## 2020-11-03 DIAGNOSIS — C3491 Malignant neoplasm of unspecified part of right bronchus or lung: Secondary | ICD-10-CM

## 2020-11-03 DIAGNOSIS — C3431 Malignant neoplasm of lower lobe, right bronchus or lung: Secondary | ICD-10-CM | POA: Diagnosis not present

## 2020-11-03 DIAGNOSIS — Z87891 Personal history of nicotine dependence: Secondary | ICD-10-CM | POA: Diagnosis not present

## 2020-11-03 DIAGNOSIS — Z9049 Acquired absence of other specified parts of digestive tract: Secondary | ICD-10-CM | POA: Diagnosis not present

## 2020-11-03 DIAGNOSIS — Z5112 Encounter for antineoplastic immunotherapy: Secondary | ICD-10-CM | POA: Diagnosis not present

## 2020-11-03 DIAGNOSIS — Z79899 Other long term (current) drug therapy: Secondary | ICD-10-CM | POA: Diagnosis not present

## 2020-11-03 DIAGNOSIS — R918 Other nonspecific abnormal finding of lung field: Secondary | ICD-10-CM

## 2020-11-03 DIAGNOSIS — E876 Hypokalemia: Secondary | ICD-10-CM | POA: Diagnosis not present

## 2020-11-03 LAB — CBC WITH DIFFERENTIAL (CANCER CENTER ONLY)
Abs Immature Granulocytes: 0.06 10*3/uL (ref 0.00–0.07)
Basophils Absolute: 0 10*3/uL (ref 0.0–0.1)
Basophils Relative: 1 %
Eosinophils Absolute: 0 10*3/uL (ref 0.0–0.5)
Eosinophils Relative: 0 %
HCT: 33.7 % — ABNORMAL LOW (ref 36.0–46.0)
Hemoglobin: 11.6 g/dL — ABNORMAL LOW (ref 12.0–15.0)
Immature Granulocytes: 1 %
Lymphocytes Relative: 27 %
Lymphs Abs: 2.4 10*3/uL (ref 0.7–4.0)
MCH: 33 pg (ref 26.0–34.0)
MCHC: 34.4 g/dL (ref 30.0–36.0)
MCV: 95.7 fL (ref 80.0–100.0)
Monocytes Absolute: 1.2 10*3/uL — ABNORMAL HIGH (ref 0.1–1.0)
Monocytes Relative: 13 %
Neutro Abs: 5.1 10*3/uL (ref 1.7–7.7)
Neutrophils Relative %: 58 %
Platelet Count: 301 10*3/uL (ref 150–400)
RBC: 3.52 MIL/uL — ABNORMAL LOW (ref 3.87–5.11)
RDW: 14.4 % (ref 11.5–15.5)
WBC Count: 8.7 10*3/uL (ref 4.0–10.5)
nRBC: 0 % (ref 0.0–0.2)

## 2020-11-03 LAB — CMP (CANCER CENTER ONLY)
ALT: 47 U/L — ABNORMAL HIGH (ref 0–44)
AST: 18 U/L (ref 15–41)
Albumin: 3.2 g/dL — ABNORMAL LOW (ref 3.5–5.0)
Alkaline Phosphatase: 58 U/L (ref 38–126)
Anion gap: 6 (ref 5–15)
BUN: 14 mg/dL (ref 8–23)
CO2: 26 mmol/L (ref 22–32)
Calcium: 8.6 mg/dL — ABNORMAL LOW (ref 8.9–10.3)
Chloride: 108 mmol/L (ref 98–111)
Creatinine: 0.67 mg/dL (ref 0.44–1.00)
GFR, Estimated: >60 mL/min (ref >60)
Glucose, Bld: 82 mg/dL (ref 70–99)
Potassium: 3.4 mmol/L — ABNORMAL LOW (ref 3.5–5.1)
Sodium: 140 mmol/L (ref 135–145)
Total Bilirubin: 0.3 mg/dL (ref 0.3–1.2)
Total Protein: 6.4 g/dL — ABNORMAL LOW (ref 6.5–8.1)

## 2020-11-03 LAB — TSH: TSH: 1.043 u[IU]/mL (ref 0.308–3.960)

## 2020-11-03 MED ORDER — SODIUM CHLORIDE 0.9 % IV SOLN
Freq: Once | INTRAVENOUS | Status: AC
  Filled 2020-11-03: qty 250

## 2020-11-03 MED ORDER — SODIUM CHLORIDE 0.9 % IV SOLN
500.0000 mg/m2 | Freq: Once | INTRAVENOUS | Status: AC
  Administered 2020-11-03: 800 mg via INTRAVENOUS
  Filled 2020-11-03: qty 20

## 2020-11-03 MED ORDER — PROCHLORPERAZINE MALEATE 10 MG PO TABS
10.0000 mg | ORAL_TABLET | Freq: Once | ORAL | Status: AC
  Administered 2020-11-03: 10 mg via ORAL

## 2020-11-03 MED ORDER — HEPARIN SOD (PORK) LOCK FLUSH 100 UNIT/ML IV SOLN
500.0000 [IU] | Freq: Once | INTRAVENOUS | Status: AC | PRN
  Administered 2020-11-03: 500 [IU]
  Filled 2020-11-03: qty 5

## 2020-11-03 MED ORDER — SODIUM CHLORIDE 0.9% FLUSH
10.0000 mL | INTRAVENOUS | Status: DC | PRN
  Administered 2020-11-03: 10 mL via INTRAVENOUS
  Filled 2020-11-03: qty 10

## 2020-11-03 MED ORDER — PROCHLORPERAZINE MALEATE 10 MG PO TABS
ORAL_TABLET | ORAL | Status: AC
  Filled 2020-11-03: qty 1

## 2020-11-03 MED ORDER — SODIUM CHLORIDE 0.9% FLUSH
10.0000 mL | INTRAVENOUS | Status: DC | PRN
  Administered 2020-11-03: 10 mL
  Filled 2020-11-03: qty 10

## 2020-11-03 MED ORDER — SODIUM CHLORIDE 0.9 % IV SOLN
200.0000 mg | Freq: Once | INTRAVENOUS | Status: AC
  Administered 2020-11-03: 200 mg via INTRAVENOUS
  Filled 2020-11-03: qty 8

## 2020-11-03 NOTE — Patient Instructions (Signed)

## 2020-11-03 NOTE — Progress Notes (Signed)
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 CT of the head showed showed concerning for metastatic disease involving the left cerebellum and right occipital lobe.  2) 09/30/2019: MRI brain confirms mass within the left cerebellum measures 1.6 x 1.3 x 1.5 cm as well as at least 8 metastatic lesions within the supratentorial and infratentorial brain as outlined 3) 10/01/2019: PET CT scan revealed hypermetabolic infrahilar right lower lobe mass with hypermetabolic nodal metastases in the right hilum, mediastinum, right supraclavicular region, left axilla and lower left neck. 4) 10/08/2019: US guided biopsy of the lymph nodes confirms poorly differentiated non-small cell carcinoma. Immunohistochemistry confirms adenocarcinoma of lung primary. PD-L1 and NGS later revealed a MET Exon 14 mutation and TPS of 90%.  5) 10/12/2019: Establish care with Dr. Lorenso Courier  6) 11/04/2019: Day 1 of capmatinib 459m BID 7) 11/09/2019: presented to Rad/Onc with a DVT in LLE. Seen in symptom management clinic and started on apixaban.  8) 12/29/2019: CT C/A/P showed interval decrease in size of mass involving the superior segment of right lower lobe and reduction in mediastinal and left supraclavicular adenopathy though some small spinal lesions were noted in the interim between the two sets of scans 9) 01/25/2020: temporarily stopped capmatinib due to symptoms of nausea/fatigue. Restarted therapy on 01/30/2020 after brief chemo holiday.  10) 03/22/2020: CT C/A/P reveals a mixed picture, with new lung nodule and  increased lymphadenopathy, however response in bone lesions. D/c capmatinib therapy 11) 04/08/2020: start of Carbo/Pem/Pem for 2nd line treatment. Cycle 1 Day 1   12) 04/29/2020: Cycle 2 Day 1 Carbo/Pem/Pem  13) 05/20/2020: Cycle 3 Day 1 Carbo/Pem/Pem  14) 06/09/2020: Cycle 4 Day 1 Carbo/Pem/Pem  15) 06/29/2020: Cycle 5 Day 1 maintenance Pem/Pem  16) 07/20/2020: Cycle 6 Day 1 maintenance Pem/Pem  17) 08/12/2020: Cycle 7 Day 1 maintenance Pem/Pem  18) 09/01/2020: Cycle 8 Day 1 maintenance Pem/Pem  19) 09/21/2020: Cycle 9 Day 1 maintenance Pem/Pem  20) 10/13/2020: Cycle 10 Day 1 maintenance Pem/Pem  21) 11/03/2020: Cycle 11 Day 1 maintenance Pem/Pem   Interval History:  Virginia BRYAND65y.o. female with medical history significant for metastatic adenocarcinoma of the lung presents for a follow up visit. The patient's last visit was on 10/13/2020 prior to the start of Cycle 10 Day 1 of Pem/Pem. In the interim since the last visit she has had no new symptoms. Today is Cycle 11 Day 1.   On exam today Virginia [name] is accompanied by her husband. She reports she has been well in the interim since her last visit.  She reports that she has been gaining weight since her last visit and is up approximately 7 pounds from her prior cycle.  She is having a little bit of wheezing and some rattles in her lungs, but is not having any shortness of breath or productive cough.  She is not noticing any major side effects as result of the treatment of chemotherapy.  She is happy to endorse that her headaches are gone but she does still have some dizziness and trouble with being off balance.  Her pain has continued to be well controlled  with the [facility] ER twice daily and oxycodones as needed 2-3 times per day. Otherwise she has had no issues with nausea, vomiting, or diarrhea. A full 10 point ROS is listed below.   MEDICAL HISTORY:  Past Medical History:  Diagnosis Date  . Anemia   . Anxiety   . Concussion 09/28/2019  .  DVT (deep venous thrombosis) ([place]) 2021   L leg  . Dyspnea   . GERD (gastroesophageal reflux disease)   . Hypercholesterolemia    per pt, she does not have elevated lipids  . Hypertension   . met lung ca dx'd 09/2019   mets to spine, hip and brain  . PONV (postoperative nausea and vomiting)   . Tobacco abuse     SURGICAL HISTORY: Past Surgical History:  Procedure Laterality Date  . ABDOMINAL HYSTERECTOMY     partial/ left ovaries  . CHOLECYSTECTOMY    . DILATION AND CURETTAGE OF UTERUS    . IR IMAGING GUIDED PORT INSERTION  10/23/2019  . KYPHOPLASTY N/A 03/15/2020   Procedure: Thoracic Eight KYPHOPLASTY;  Surgeon: Erline Levine, MD;  Location: [place];  Service: Neurosurgery;  Laterality: N/A;  prone   . LIPOMA EXCISION  2018   removed under left breast and right thigh.  . TUBAL LIGATION      ALLERGIES:  has No Known Allergies.  MEDICATIONS:  Current Outpatient Medications  Medication Sig Dispense Refill  . albuterol (VENTOLIN HFA) 108 (90 Base) MCG/ACT inhaler Inhale 2 puffs into the lungs every 6 (six) hours as needed for wheezing or shortness of breath. 6.7 g 11  . ALPRAZolam (XANAX) 0.25 MG tablet Take 1 tablet (0.25 mg total) by mouth 2 (two) times daily as needed. for anxiety (Patient taking differently: Take 0.25 mg by mouth 2 (two) times daily as needed for anxiety.) 60 tablet 5  . amLODipine (NORVASC) 5 MG tablet Take 1 tablet (5 mg total) by mouth daily. 30 tablet 3  . Cyanocobalamin (VITAMIN B12 PO) Take 2 tablets by mouth daily. Gummies    . dexamethasone (DECADRON) 2 MG tablet Take 1 tablet (2 mg total) by mouth daily. 30 tablet 0  . folic acid (FOLVITE) 1 MG tablet TAKE 1 TABLET BY MOUTH DAILY. 90 tablet 1  . furosemide (LASIX) 20 MG tablet 20 mg once daily for 3 days, then stop. May repeat if edema recurs. 30 tablet 1  . lidocaine-prilocaine (EMLA) cream Apply 1 application topically as needed. Apply to port as needed. 30 g 0  . methocarbamol (ROBAXIN) 500 MG tablet  Take 500 mg by mouth 3 (three) times daily as needed.    . Multiple Vitamins-Minerals (AIRBORNE PO) Take 1 tablet by mouth daily.     Marland Kitchen ofloxacin (OCUFLOX) 0.3 % ophthalmic solution 2 drops each eye 4 times a day for 5 days 5 mL 1  . OLANZapine (ZYPREXA) 2.5 MG tablet Take 1 tablet (2.5 mg total) by mouth at bedtime. 90 tablet 1  . oxyCODONE (OXY IR/ROXICODONE) 5 MG immediate release tablet Take 1-2 tablets (5-10 mg total) by mouth every 4 (four) hours as needed for severe pain. 90 tablet 0  . pantoprazole (PROTONIX) 40 MG tablet TAKE ONE TABLET BY MOUTH DAILY 90 tablet 3  . pentoxifylline (TRENTAL) 400 MG CR tablet Take 467m tab daily x 1 week, then 4069mBID; take with food 60 tablet 4  . polyethylene glycol (MIRALAX / GLYCOLAX) 17 g packet Take 17 g by mouth 2 (two) times daily.     .Marland Kitchen  prochlorperazine (COMPAZINE) 10 MG tablet TAKE 1 TABLET BY MOUTH EVERY 6 HOURS AS NEEDED FOR NAUSEA OR VOMITING 30 tablet 0  . sennosides-docusate sodium (SENOKOT-S) 8.6-50 MG tablet Take 2-3 tablets by mouth See admin instructions. 2 in the morning and 3 at night. Adjust as needed    . sucralfate (CARAFATE) 1 g tablet Dissolve 1 tablet in 10 mL H20 and swallow 30 min prior to meals and bedtime. 50 tablet 3  . Vitamin D3 (VITAMIN D) 25 MCG tablet Take 2,000 Units by mouth daily.    . vitamin E 180 MG (400 UNITS) capsule Take 400 IU daily x 1 week, then 400 IU BID 60 capsule 4  . XTAMPZA ER 13.5 MG C12A Take 1 pill by mouth every 12 hours 60 capsule 0   No current facility-administered medications for this visit.   Facility-Administered Medications Ordered in Other Visits  Medication Dose Route Frequency Provider Last Rate Last Admin  . sodium chloride flush (NS) 0.9 % injection 10 mL  10 mL Intracatheter PRN Orson Slick, MD   10 mL at 11/03/20 1427    REVIEW OF SYSTEMS:   Constitutional: ( - ) fevers, ( - )  chills , ( - ) night sweats Eyes: ( - ) blurriness of vision, ( - ) double vision, ( - ) watery  eyes Ears, nose, mouth, throat, and face: ( - ) mucositis, ( - ) sore throat Respiratory: ( - ) cough, ( - ) dyspnea, ( - ) wheezes Cardiovascular: ( - ) palpitation, ( - ) chest discomfort, ( - ) lower extremity swelling Gastrointestinal:  ( - ) nausea, ( - ) heartburn, ( - ) change in bowel habits Skin: ( - ) abnormal skin rashes Lymphatics: ( - ) new lymphadenopathy, ( - ) easy bruising Neurological: ( - ) numbness, ( - ) tingling, ( - ) new weaknesses Behavioral/Psych: ( - ) mood change, ( - ) new changes  All other systems were reviewed with the patient and are negative.  PHYSICAL EXAMINATION: ECOG PERFORMANCE STATUS: 1 - Symptomatic but completely ambulatory  Vitals:   11/03/20 1145  BP: (!) 133/98  Pulse: 89  Resp: 14  Temp: 97.8 F (36.6 C)  SpO2: 100%   Filed Weights   11/03/20 1145  Weight: 140 lb 6.4 oz (63.7 kg)    GENERAL: well appearing middle aged Caucasian female in NAD  SKIN: skin color, texture, turgor are normal, no rashes or significant lesions EAR: left ear tympanic membrane mildly erythematous. No discharge or clear signs of bacterial infection.  EYES: conjunctiva are pink and non-injected, sclera clear LUNGS: clear to auscultation and percussion with normal breathing effort HEART: regular rate & rhythm and no murmurs and bilateral + trace pitting lower extremity edema Musculoskeletal: no cyanosis of digits and no clubbing PSYCH: alert & oriented x 3, fluent speech NEURO: no focal motor/sensory deficits   LABORATORY DATA:  I have reviewed the data as listed CBC Latest Ref Rng & Units 11/03/2020 10/13/2020 09/21/2020  WBC 4.0 - 10.5 K/uL 8.7 7.3 8.7  Hemoglobin 12.0 - 15.0 g/dL 11.6(L) 11.6(L) 11.6(L)  Hematocrit 36.0 - 46.0 % 33.7(L) 33.8(L) 33.1(L)  Platelets 150 - 400 K/uL 301 280 310    CMP Latest Ref Rng & Units 11/03/2020 10/13/2020 09/21/2020  Glucose 70 - 99 mg/dL 82 95 110(H)  BUN 8 - 23 mg/dL _0 Creatinine 0.44 - 1.00 mg/dL 0.67 0.63  0.68  Sodium 135 - 145 mmol/L  140 142 140  Potassium 3.5 - 5.1 mmol/L 3.4(L) 3.8 3.8  Chloride 98 - 111 mmol/L 108 108 106  CO2 22 - 32 mmol/L _0 Calcium 8.9 - 10.3 mg/dL 8.6(L) 9.3 9.1  Total Protein 6.5 - 8.1 g/dL 6.4(L) 6.8 6.8  Total Bilirubin 0.3 - 1.2 mg/dL 0.3 0.2(L) 0.3  Alkaline Phos 38 - 126 U/L 58 73 73  AST 15 - 41 U/L 18 27 34  ALT 0 - 44 U/L 47(H) 35 56(H)     RADIOGRAPHIC STUDIES: I personally have viewed the radiographic studies below: Table disease from prior, no new lesions or new metastatic disease. No results found.  ASSESSMENT & PLAN Virginia [name] 65 y.o. female with medical history significant for metastatic adenocarcinoma of the lung presents for a follow up visit. 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   Today Mrs. Wessinger reports she is currently stable at her baseline.  Her pain continues to be under good control and she is not having any new symptoms at this time. Overall she is willing and able to proceed with chemotherapy had no reservations about moving forward.  She is also not demonstrating any symptoms of recurrent VTE or signs or symptoms concerning for worsening brain bleed.  Previously we discussed Carboplatin/Pembrolizumab/Pemetrexed and the expected side effect from these medications.  We discussed the possible side effects of immunotherapy including colitis, hepatitis, dermatitis, and pneumonitis.  Additionally we discussed the side effects of carboplatin which include suppression of blood counts, nephrotoxicity, and nausea.  Additionally we discussed pemetrexed which can also have effects on counts and would require supplementation with vitamin Z98 and folic acid.  She voiced her understanding of these side effects and treatment plans moving forward.  I also noted that we would be able to drop the chemotherapy agents that she had intolerance to continue with pembrolizumab alone given her high TPS score.   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 --port in place, patient completed chemotherapy education.  --plan for repeat CT C/A/P in April 2022 (3 months from last scan in Jan 2022) --supportive therapy as listed below.  --RTC q 3 weeks for continued monitoring/chemotherapy treatments.    #Nausea/Vomiting -- provided patient with Zofran 4-78m q8h PRN and compazine [id number] for breakthrough PRN. These have not been required since olanzapine was added --continue olanzpaine 2.13m16mO QHS to help with nausea. Marked improvement since the addition of this medication on 01/29/2020.   --continue to monitor   #Hypokalemia, stable --likely 2/2 to decreased PO intake, hypovolemia, and BP medications --given her decreased BP would recommend holding losartan and hydrochlorothiazide --continue to monitor  #Pain Control, improving --continue oxycodone 13mg68mH PRN. Can increase dose to 10mg63m if pain is severe. She is taking approximately 2-3 pills per day.  --continue Xtampza ER BID for long acting support.  --continue to take with Senokot to prevent opioid induced constipation. 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  --nausea as above   #Headaches, resolved --appreciate the assistance of Dr. VasloMickeal Skinnerreating her brain metastasis and symptoms. --radiation therapy  to the brain completed on 11/16/2019.  --MRI brain on 04/29/2020 showed evidence of small brain bleed. Eliquis therapy was discontinued.  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    No orders of the defined types were placed in this encounter.  All questions were answered. The patient knows to call the clinic with any problems, questions or concerns.  A total of more than 30 minutes were spent on this encounter and over half of that time was spent on counseling and coordination of care as outlined above.   Ledell Peoples, MD Department of Hematology/Oncology [place] [facility] Phone: [phone] Pager: [phone] Email: [email or web address]  11/03/2020 7:03 PM   Literature Support:  Lorenza Chick, Rodrguez-Abreu D, Duffy Rhody, Felip E, De Angelis F, Domine M, [place], Hochmair MJ, [place], [place], Bischoff HG, [place], Grossi F, [place], Reck M, [place], [place], [place], Rubio-Viqueira B, Novello S, Kurata T, Gray JE, Vida J, Wei Z, Yang J, Raftopoulos H, [place], [place] [employer]; [id number] Investigators. Pembrolizumab plus Chemotherapy in Metastatic Non-Small-Cell Lung Cancer. [place] Med. 2018 May 31;378(22):2078-2092.  --Median progression-free survival was 8.8 months (95% CI, 7.6 to 9.2) in the pembrolizumab-combination group and 4.9 months (95% CI, 4.7 to 5.5) in the placebo-combination group (hazard ratio for disease progression or death, 0.52; 95% CI, 0.43 to 0.64; P<0.001). Adverse events of grade 3 or higher occurred in 67.2% of the patients in the pembrolizumab-combination group and in 65.8% of those in the placebo-combination group.  Jodene Nam L. Efficacy of pemetrexed-based regimens in advanced non-small cell lung cancer patients with activating epidermal growth factor receptor mutations after tyrosine kinase inhibitor failure: a systematic review. Onco Targets Ther. 2018;11:2121-2129.   --The weighted median PFS, median OS, and ORR for patients treated with pem regimens were 5.09 months, 15.91 months, and 30.19%,  respectively. Our systematic review results showed a favorable efficacy profile of pem regimens in NSCLC patients with EGFR mutation after EGFR-TKI failure.

## 2020-11-03 NOTE — Patient Instructions (Signed)
[place] Discharge Instructions for Patients Receiving Chemotherapy  Today you received the following chemotherapy agents Pembrolizumab (KEYTRUDA) & Pemetrexed (ALIMTA).  To help prevent nausea and vomiting after your treatment, we encourage you to take your nausea medication as prescribed.   If you develop nausea and vomiting that is not controlled by your nausea medication, call the clinic.   BELOW ARE SYMPTOMS THAT SHOULD BE REPORTED IMMEDIATELY:  *FEVER GREATER THAN 100.5 F  *CHILLS WITH OR WITHOUT FEVER  NAUSEA AND VOMITING THAT IS NOT CONTROLLED WITH YOUR NAUSEA MEDICATION  *UNUSUAL SHORTNESS OF BREATH  *UNUSUAL BRUISING OR BLEEDING  TENDERNESS IN MOUTH AND THROAT WITH OR WITHOUT PRESENCE OF ULCERS  *URINARY PROBLEMS  *BOWEL PROBLEMS  UNUSUAL RASH Items with * indicate a potential emergency and should be followed up as soon as possible.  Feel free to call the clinic should you have any questions or concerns. The clinic phone number is [phone].  Please show the [place] at check-in to the Emergency Department and triage nurse.  Zoledronic Acid Injection (Hypercalcemia, Oncology) What is this medicine? ZOLEDRONIC ACID (ZOE le dron ik AS id) slows calcium loss from bones. It high calcium levels in the blood from some kinds of cancer. It may be used in other people at risk for bone loss. This medicine may be used for other purposes; ask your health care provider or pharmacist if you have questions. COMMON BRAND NAME(S): Zometa What should I tell my health care provider before I take this medicine? They need to know if you have any of these conditions:  cancer  dehydration  dental disease  kidney disease  liver disease  low levels of calcium in the blood  lung or breathing disease (asthma)  receiving steroids like dexamethasone or prednisone  an unusual or allergic reaction to zoledronic acid, other medicines, foods, dyes, or  preservatives  pregnant or trying to get pregnant  breast-feeding How should I use this medicine? This drug is injected into a vein. It is given by a health care provider in a hospital or clinic setting. Talk to your health care provider about the use of this drug in children. Special care may be needed. Overdosage: If you think you have taken too much of this medicine contact a poison control center or emergency room at once. NOTE: This medicine is only for you. Do not share this medicine with others. What if I miss a dose? Keep appointments for follow-up doses. It is important not to miss your dose. Call your health care provider if you are unable to keep an appointment. What may interact with this medicine?  certain antibiotics given by injection  NSAIDs, medicines for pain and inflammation, like ibuprofen or naproxen  some diuretics like bumetanide, furosemide  teriparatide  thalidomide This list may not describe all possible interactions. Give your health care provider a list of all the medicines, herbs, non-prescription drugs, or dietary supplements you use. Also tell them if you smoke, drink alcohol, or use illegal drugs. Some items may interact with your medicine. What should I watch for while using this medicine? Visit your health care provider for regular checks on your progress. It may be some time before you see the benefit from this drug. Some people who take this drug have severe bone, joint, or muscle pain. This drug may also increase your risk for jaw problems or a broken thigh bone. Tell your health care provider right away if you have severe pain in  your jaw, bones, joints, or muscles. Tell you health care provider if you have any pain that does not go away or that gets worse. Tell your dentist and dental surgeon that you are taking this drug. You should not have major dental surgery while on this drug. See your dentist to have a dental exam and fix any dental problems  before starting this drug. Take good care of your teeth while on this drug. Make sure you see your dentist for regular follow-up appointments. You should make sure you get enough calcium and vitamin D while you are taking this drug. Discuss the foods you eat and the vitamins you take with your health care provider. Check with your health care provider if you have severe diarrhea, nausea, and vomiting, or if you sweat a lot. The loss of too much body fluid may make it dangerous for you to take this drug. You may need blood work done while you are taking this drug. Do not become pregnant while taking this drug. Women should inform their health care provider if they wish to become pregnant or think they might be pregnant. There is potential for serious harm to an unborn child. Talk to your health care provider for more information. What side effects may I notice from receiving this medicine? Side effects that you should report to your doctor or health care provider as soon as possible:  allergic reactions (skin rash, itching or hives; swelling of the face, lips, or tongue)  bone pain  infection (fever, chills, cough, sore throat, pain or trouble passing urine)  jaw pain, especially after dental work  joint pain  kidney injury (trouble passing urine or change in the amount of urine)  low blood pressure (dizziness; feeling faint or lightheaded, falls; unusually weak or tired)  low calcium levels (fast heartbeat; muscle cramps or pain; pain, tingling, or numbness in the hands or feet; seizures)  low magnesium levels (fast, irregular heartbeat; muscle cramp or pain; muscle weakness; tremors; seizures)  low red blood cell counts (trouble breathing; feeling faint; lightheaded, falls; unusually weak or tired)  muscle pain  redness, blistering, peeling, or loosening of the skin, including inside the mouth  severe diarrhea  swelling of the ankles, feet, hands  trouble breathing Side effects  that usually do not require medical attention (report to your doctor or health care provider if they continue or are bothersome):  anxious  constipation  coughing  depressed mood  eye irritation, itching, or pain  fever  general ill feeling or flu-like symptoms  nausea  pain, redness, or irritation at site where injected  trouble sleeping This list may not describe all possible side effects. Call your doctor for medical advice about side effects. You may report side effects to FDA at 1-800-FDA-1088. Where should I keep my medicine? This drug is given in a hospital or clinic. It will not be stored at home. NOTE: This sheet is a summary. It may not cover all possible information. If you have questions about this medicine, talk to your doctor, pharmacist, or health care provider.  2021 Elsevier/Gold Standard (2019-06-11 09:13:00)

## 2020-11-04 ENCOUNTER — Ambulatory Visit
Admission: RE | Admit: 2020-11-04 | Discharge: 2020-11-04 | Disposition: A | Discharge status: Home / Self Care | Payer: BC Managed Care – PPO | Source: Ambulatory Visit | Attending: Internal Medicine | Admitting: Internal Medicine

## 2020-11-04 DIAGNOSIS — C3490 Malignant neoplasm of unspecified part of unspecified bronchus or lung: Secondary | ICD-10-CM | POA: Diagnosis not present

## 2020-11-04 DIAGNOSIS — G936 Cerebral edema: Secondary | ICD-10-CM | POA: Diagnosis not present

## 2020-11-04 DIAGNOSIS — C7931 Secondary malignant neoplasm of brain: Secondary | ICD-10-CM | POA: Diagnosis not present

## 2020-11-04 DIAGNOSIS — G9389 Other specified disorders of brain: Secondary | ICD-10-CM | POA: Diagnosis not present

## 2020-11-04 MED ORDER — GADOBENATE DIMEGLUMINE 529 MG/ML IV SOLN
12.0000 mL | Freq: Once | INTRAVENOUS | Status: AC | PRN
  Administered 2020-11-04: 12 mL via INTRAVENOUS

## 2020-11-07 ENCOUNTER — Inpatient Hospital Stay: Payer: BC Managed Care – PPO

## 2020-11-07 ENCOUNTER — Inpatient Hospital Stay (HOSPITAL_BASED_OUTPATIENT_CLINIC_OR_DEPARTMENT_OTHER): Payer: BC Managed Care – PPO | Admitting: Internal Medicine

## 2020-11-07 ENCOUNTER — Other Ambulatory Visit: Payer: Self-pay | Admitting: Internal Medicine

## 2020-11-07 ENCOUNTER — Other Ambulatory Visit: Payer: Self-pay

## 2020-11-07 VITALS — BP 120/91 | HR 93 | Temp 97.2°F | Resp 18 | Ht 62.0 in | Wt 141.4 lb

## 2020-11-07 DIAGNOSIS — Z5112 Encounter for antineoplastic immunotherapy: Secondary | ICD-10-CM | POA: Diagnosis not present

## 2020-11-07 DIAGNOSIS — Z8249 Family history of ischemic heart disease and other diseases of the circulatory system: Secondary | ICD-10-CM | POA: Diagnosis not present

## 2020-11-07 DIAGNOSIS — Z5111 Encounter for antineoplastic chemotherapy: Secondary | ICD-10-CM | POA: Diagnosis not present

## 2020-11-07 DIAGNOSIS — C3431 Malignant neoplasm of lower lobe, right bronchus or lung: Secondary | ICD-10-CM | POA: Diagnosis not present

## 2020-11-07 DIAGNOSIS — R2689 Other abnormalities of gait and mobility: Secondary | ICD-10-CM | POA: Diagnosis not present

## 2020-11-07 DIAGNOSIS — R42 Dizziness and giddiness: Secondary | ICD-10-CM | POA: Diagnosis not present

## 2020-11-07 DIAGNOSIS — C7931 Secondary malignant neoplasm of brain: Secondary | ICD-10-CM | POA: Diagnosis not present

## 2020-11-07 DIAGNOSIS — Z79899 Other long term (current) drug therapy: Secondary | ICD-10-CM | POA: Diagnosis not present

## 2020-11-07 DIAGNOSIS — E876 Hypokalemia: Secondary | ICD-10-CM | POA: Diagnosis not present

## 2020-11-07 DIAGNOSIS — Z9049 Acquired absence of other specified parts of digestive tract: Secondary | ICD-10-CM | POA: Diagnosis not present

## 2020-11-07 DIAGNOSIS — R590 Localized enlarged lymph nodes: Secondary | ICD-10-CM | POA: Diagnosis not present

## 2020-11-07 DIAGNOSIS — Z87891 Personal history of nicotine dependence: Secondary | ICD-10-CM | POA: Diagnosis not present

## 2020-11-07 DIAGNOSIS — Z7952 Long term (current) use of systemic steroids: Secondary | ICD-10-CM | POA: Diagnosis not present

## 2020-11-07 DIAGNOSIS — R52 Pain, unspecified: Secondary | ICD-10-CM | POA: Diagnosis not present

## 2020-11-07 DIAGNOSIS — C7951 Secondary malignant neoplasm of bone: Secondary | ICD-10-CM | POA: Diagnosis not present

## 2020-11-07 DIAGNOSIS — Z86718 Personal history of other venous thrombosis and embolism: Secondary | ICD-10-CM | POA: Diagnosis not present

## 2020-11-07 MED ORDER — DEXAMETHASONE 2 MG PO TABS: 2.0000 mg | ORAL_TABLET | Freq: Every day | ORAL | 1 refills | Status: DC

## 2020-11-07 MED FILL — DEXAMETHASONE 2 MG TABLET: 2 | 30 days supply | Qty: 30 | Fill #0

## 2020-11-07 NOTE — Progress Notes (Signed)
[facility] at [place], [place] [address] [phone]   Interval Evaluation  Date of Service: 11/07/20 Patient Name: Virginia [name] Patient MRN: [id number] Patient DOB: 10-12-1955 Provider: Ventura Sellers, MD  Identifying Statement:  Virginia [name] is a 65 y.o. 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 Isidore Moos  Interval History:  Virginia [name] presents today following recent MRI brain.  Continues to complain of some dizziness and gait instability, although not new from prior.  She otherwise describes no new or progressive neurologic deficits.  No seizures or headaches.  Continues on decadron 70m daily.  She is tolerating chemotherapy well with Dr. DLorenso Courier  H+P (10/15/19) Patient presents to clinic to discuss recent neurologic symptoms and imaging findings.  She describes a fall while climbing stairs on 1/20; she hit the back of her and lost consciousness.  She is unclear whether she lost her balance or had mechanical provocation, but she had been drinking that evening.  She was treated initially as a trauma, CT demonstrated a lesion in the posterior fossa, later confirmed as a metastasis by MRI, along with 7 additional smaller lesions.  It took her almost two weeks to return for cognition and balance to return to "normal" which she feels she is about at right now.  Baseline she is high functioning and independent, works as a bNeurosurgeon  Has been taking decadron 263mtwice per day.  Plans to initiate chemotherapy soon with Dr. DoLorenso Courier Medications: Current Outpatient Medications on File Prior to Visit  Medication Sig Dispense Refill  . acetaminophen (TYLENOL) 500 MG tablet Take by mouth.    . Marland Kitchenlbuterol (VENTOLIN HFA) 108 (90 Base) MCG/ACT inhaler Inhale 2 puffs into the lungs every 6 (six) hours as needed for  wheezing or shortness of breath. 6.7 g 11  . ALPRAZolam (XANAX) 0.25 MG tablet Take 1 tablet (0.25 mg total) by mouth 2 (two) times daily as needed. for anxiety (Patient taking differently: Take 0.25 mg by mouth 2 (two) times daily as needed for anxiety.) 60 tablet 5  . amLODipine (NORVASC) 5 MG tablet Take 1 tablet (5 mg total) by mouth daily. 30 tablet 3  . Cyanocobalamin (VITAMIN B12 PO) Take 2 tablets by mouth daily. Gummies    . dexamethasone (DECADRON) 2 MG tablet Take 1 tablet (2 mg total) by mouth daily. 30 tablet 0  . folic acid (FOLVITE) 1 MG tablet TAKE 1 TABLET BY MOUTH DAILY. 90 tablet 1  . furosemide (LASIX) 20 MG tablet 20 mg once daily for 3 days, then stop. Octavio Matheney repeat if edema recurs. 30 tablet 1  . hydrochlorothiazide (HYDRODIURIL) 25 MG tablet Take by mouth.    . lidocaine-prilocaine (EMLA) cream Apply 1 application topically as needed. Apply to port as needed. 30 g 0  . methocarbamol (ROBAXIN) 500 MG tablet Take 500 mg by mouth 3 (three) times daily as needed.    . Multiple Vitamins-Minerals (AIRBORNE PO) Take 1 tablet by mouth daily.     . Marland Kitchenfloxacin (OCUFLOX) 0.3 % ophthalmic solution 2 drops each eye 4 times a day for 5 days 5 mL 1  . OLANZapine (ZYPREXA) 2.5 MG tablet Take 1 tablet (2.5 mg total) by mouth at bedtime. 90 tablet 1  . oxyCODONE (OXY IR/ROXICODONE) 5 MG immediate release tablet Take 1-2 tablets (5-10 mg total) by mouth  every 4 (four) hours as needed for severe pain. 90 tablet 0  . pantoprazole (PROTONIX) 40 MG tablet TAKE ONE TABLET BY MOUTH DAILY 90 tablet 3  . pentoxifylline (TRENTAL) 400 MG CR tablet Take $RemoveBef'400mg'dDGKDBHZpA$  tab daily x 1 week, then $RemoveBe'400mg'MnFSIxUya$  BID; take with food 60 tablet 4  . polyethylene glycol (MIRALAX / GLYCOLAX) 17 g packet Take 17 g by mouth 2 (two) times daily.     . prochlorperazine (COMPAZINE) 10 MG tablet TAKE 1 TABLET BY MOUTH EVERY 6 HOURS AS NEEDED FOR NAUSEA OR VOMITING 30 tablet 0  . sennosides-docusate sodium (SENOKOT-S) 8.6-50 MG tablet Take  2-3 tablets by mouth See admin instructions. 2 in the morning and 3 at night. Adjust as needed    . sucralfate (CARAFATE) 1 g tablet Dissolve 1 tablet in 10 mL H20 and swallow 30 min prior to meals and bedtime. 50 tablet 3  . Vitamin D3 (VITAMIN D) 25 MCG tablet Take 2,000 Units by mouth daily.    . vitamin E 180 MG (400 UNITS) capsule Take 400 IU daily x 1 week, then 400 IU BID 60 capsule 4  . XTAMPZA ER 13.5 MG C12A Take 1 pill by mouth every 12 hours 60 capsule 0   No current facility-administered medications on file prior to visit.    Allergies: No Known Allergies Past Medical History:  Past Medical History:  Diagnosis Date  . Anemia   . Anxiety   . Concussion 09/28/2019  . DVT (deep venous thrombosis) ([facility]) 2021   L leg  . Dyspnea   . GERD (gastroesophageal reflux disease)   . Hypercholesterolemia    per pt, she does not have elevated lipids  . Hypertension   . met lung ca dx'd 09/2019   mets to spine, hip and brain  . PONV (postoperative nausea and vomiting)   . Tobacco abuse    Past Surgical History:  Past Surgical History:  Procedure Laterality Date  . ABDOMINAL HYSTERECTOMY     partial/ left ovaries  . CHOLECYSTECTOMY    . DILATION AND CURETTAGE OF UTERUS    . IR IMAGING GUIDED PORT INSERTION  10/23/2019  . KYPHOPLASTY N/A 03/15/2020   Procedure: Thoracic Eight KYPHOPLASTY;  Surgeon: Erline Levine, MD;  Location: [facility];  Service: Neurosurgery;  Laterality: N/A;  prone   . LIPOMA EXCISION  2018   removed under left breast and right thigh.  . TUBAL LIGATION     Social History:  Social History   Socioeconomic History  . Marital status: Married    Spouse name: Not on file  . Number of children: 3  . Years of education: Not on file  . Highest education level: Not on file  Occupational History  . Occupation: owns Medical illustrator: [employer]  Tobacco Use  . Smoking status: Former Smoker    Packs/day: 0.25    Quit date: 10/31/2012    Years since  quitting: 8.0  . Smokeless tobacco: Never Used  Vaping Use  . Vaping Use: Never used  Substance and Sexual Activity  . Alcohol use: Yes    Alcohol/week: 0.0 standard drinks    Comment: rare  . Drug use: No  . Sexual activity: Yes  Other Topics Concern  . Not on file  Social History Narrative  . Not on file   Social Determinants of Health   Financial Resource Strain: Not on file  Food Insecurity: Not on file  Transportation Needs: Not on file  Physical  Activity: Not on file  Stress: Not on file  Social Connections: Not on file  Intimate Partner Violence: Not on file   Family History:  Family History  Problem Relation Age of Onset  . Heart disease Father   . Drug abuse Daughter   . Drug abuse Son   . Cancer Sister   . Heart disease Brother   . Heart attack Brother     Review of Systems: Constitutional: Doesn't report fevers, chills or abnormal weight loss Eyes: Doesn't report blurriness of vision Ears, nose, mouth, throat, and face: Doesn't report sore throat Respiratory: Doesn't report cough, dyspnea or wheezes Cardiovascular: Doesn't report palpitation, chest discomfort  Gastrointestinal:  Doesn't report nausea, constipation, diarrhea GU: Doesn't report incontinence Skin: Doesn't report skin rashes Neurological: Per HPI Musculoskeletal: Doesn't report joint pain Behavioral/Psych: Doesn't report anxiety  Physical Exam: Vitals:   11/07/20 1037  BP: (!) 120/91  Pulse: 93  Resp: 18  Temp: (!) 97.2 F (36.2 C)  SpO2: 98%   KPS: 90. General: Alert, cooperative, pleasant, in no acute distress Head: Normal EENT: No conjunctival injection or scleral icterus.  Lungs: Resp effort normal Cardiac: Regular rate Abdomen: Non-distended abdomen Skin: No rashes cyanosis or petechiae. Extremities: No clubbing or edema  Neurologic Exam: Mental Status: Awake, alert, attentive to examiner. Oriented to self and environment. Language is fluent with intact comprehension.   Cranial Nerves: Visual acuity is grossly normal. Visual fields are full. Extra-ocular movements intact. No ptosis. Face is symmetric Motor: Tone and bulk are normal. Power is full in both arms and legs. Reflexes are symmetric, no pathologic reflexes present.  Sensory: Intact to light touch Gait: Moderate impairment to tandem gait only  Labs: I have reviewed the data as listed    Component Value Date/Time   NA 140 11/03/2020 1130   K 3.4 (L) 11/03/2020 1130   CL 108 11/03/2020 1130   CO2 26 11/03/2020 1130   GLUCOSE 82 11/03/2020 1130   BUN 14 11/03/2020 1130   CREATININE 0.67 11/03/2020 1130   CREATININE 0.58 11/15/2017 0948   CALCIUM 8.6 (L) 11/03/2020 1130   PROT 6.4 (L) 11/03/2020 1130   ALBUMIN 3.2 (L) 11/03/2020 1130   AST 18 11/03/2020 1130   ALT 47 (H) 11/03/2020 1130   ALKPHOS 58 11/03/2020 1130   BILITOT 0.3 11/03/2020 1130   GFRNONAA >60 11/03/2020 1130   GFRNONAA 100 11/15/2017 0948   GFRAA >60 06/09/2020 1051   GFRAA 116 11/15/2017 0948   Lab Results  Component Value Date   WBC 8.7 11/03/2020   NEUTROABS 5.1 11/03/2020   HGB 11.6 (L) 11/03/2020   HCT 33.7 (L) 11/03/2020   MCV 95.7 11/03/2020   PLT 301 11/03/2020    Imaging:  [facility] Clinician Interpretation: I have personally reviewed the CNS images as listed.  My interpretation, in the context of the patient's clinical presentation, is progressive disease  MR BRAIN W WO CONTRAST  Result Date: 11/05/2020 CLINICAL DATA:  Follow-up S RS treatment for metastatic lung cancer. EXAM: MRI HEAD WITHOUT AND WITH CONTRAST TECHNIQUE: Multiplanar, multiecho pulse sequences of the brain and surrounding structures were obtained without and with intravenous contrast. CONTRAST:  67mL MULTIHANCE GADOBENATE DIMEGLUMINE 529 MG/ML IV SOLN COMPARISON:  08/18/2020.  04/29/2020. FINDINGS: BRAIN New Lesions: Multiple foci of disease progression within the cerebellum. Previously, therefore definite enhancing lesions, possibly with a  few small other subtle foci. On today's study, there are approximately 12 enhancing lesions within the cerebellum. Most of these measure 4 mm  or less. See below regarding description of the dominant lesion on the left. Larger lesions: In the left cerebral hemisphere, enlarging enhancing mass measured on the last study at 13 x 19 mm measures 19 x 24 mm today. Central necrosis is present. There is increase edema within the left cerebellum since the previous study. Stable or Smaller lesions: Cerebral hemispheric enhancing foci are stable. Tiny foci of enhancement in the right occipital lobe axial image 59, right frontoparietal junction axial image 89, left frontal lobe axial image 97 in the left parietal lobe axial image 113 are stable. Other Brain findings: No hydrocephalus. No sign of ischemic infarction no widespread leptomeningeal enhancement. Vascular: Major vessels at the base of the brain show flow. Skull and upper cervical spine: No evidence of osseous metastatic disease. Sinuses/Orbits: No significant sinus phlegm a tori disease. Orbits negative. Other: None IMPRESSION: 1. Multiple new foci of disease progression within the cerebellum. Most of these measure 4 mm or less. On today's study, there are approximately 12 enhancing lesions within the cerebellum. 2. Dominant enlarging mass in the left cerebral hemisphere measuring 19 x 24 mm today compared with 13 x 19 mm on the immediate prior exam with central necrosis and increased edema since the previous study. Taken in total, posterior fossa findings would appear more likely to relate to actual disease progression rather than treatment effect. I do think that conceivably treatment effect could cause this pattern of findings, but particularly due to the numerous new small foci of enhancement, actual metastatic tumor is favored. 3. Other treated lesions within the cerebral hemispheres are stable. Electronically Signed   By: Nelson Chimes M.D.   On: 11/05/2020 00:56     Assessment/Plan Brain metastases ([facility])   Virginia [name] is clinically stable today, though still with some objective signs localizing to posterior fossa.  MRI brain demonstrates multiple tiny foci of enhancement within the posterior fossa consistent with new metastatic burden.  Treated left para-vermian lesion also continues to progress in enhancing volume.  These images were reviewed carefully this morning in brain/spine tumor board.  We recommended radiosurgery for new metastatic foci; radiation treatment plan from last year will be fused to ensure no dosage overlap.  For progressive treated lesion, we will reach out to Dr. Brett Albino at [facility] regarding potential candidacy for LITT.  Proximity to 4th ventricle Raequon Catanzaro preclude utility of LITT.  In that case, we could consider stereotactic biopsy for definitive diagnosis.  Further radiosurgery would potentially incite further mass effect and inflammation if this lesion is radiation necrosis, and steroids would not prevent tumor growth if etiology is neoplastic.    We will discuss further with the group and reach out to her in several days via phone for further management options.  Ok to continue decadron 9m daily in the interim.     We appreciate the opportunity to participate in the care of Virginia [name]   All questions were answered. The patient knows to call the clinic with any problems, questions or concerns. No barriers to learning were detected.  The total time spent in the encounter was 40 minutes and more than 50% was on counseling and review of test results   ZVentura Sellers MD Medical Director of Neuro-Oncology [facility]at [place] [place]02/28/22 10:48 AM

## 2020-11-08 ENCOUNTER — Telehealth: Payer: Self-pay | Admitting: Internal Medicine

## 2020-11-08 NOTE — Telephone Encounter (Signed)
Scheduled per 02/28 los, patient has been called and voicemail was left.

## 2020-11-10 ENCOUNTER — Inpatient Hospital Stay: Payer: BC Managed Care – PPO | Attending: Hematology and Oncology | Admitting: Internal Medicine

## 2020-11-10 DIAGNOSIS — Z79899 Other long term (current) drug therapy: Secondary | ICD-10-CM | POA: Insufficient documentation

## 2020-11-10 DIAGNOSIS — E876 Hypokalemia: Secondary | ICD-10-CM | POA: Insufficient documentation

## 2020-11-10 DIAGNOSIS — Z5112 Encounter for antineoplastic immunotherapy: Secondary | ICD-10-CM | POA: Insufficient documentation

## 2020-11-10 DIAGNOSIS — Z9049 Acquired absence of other specified parts of digestive tract: Secondary | ICD-10-CM | POA: Insufficient documentation

## 2020-11-10 DIAGNOSIS — C3431 Malignant neoplasm of lower lobe, right bronchus or lung: Secondary | ICD-10-CM | POA: Insufficient documentation

## 2020-11-10 DIAGNOSIS — J00 Acute nasopharyngitis [common cold]: Secondary | ICD-10-CM | POA: Insufficient documentation

## 2020-11-10 DIAGNOSIS — R112 Nausea with vomiting, unspecified: Secondary | ICD-10-CM | POA: Insufficient documentation

## 2020-11-10 DIAGNOSIS — Z5111 Encounter for antineoplastic chemotherapy: Secondary | ICD-10-CM | POA: Insufficient documentation

## 2020-11-10 DIAGNOSIS — Z86718 Personal history of other venous thrombosis and embolism: Secondary | ICD-10-CM | POA: Insufficient documentation

## 2020-11-10 DIAGNOSIS — C7931 Secondary malignant neoplasm of brain: Secondary | ICD-10-CM | POA: Insufficient documentation

## 2020-11-10 DIAGNOSIS — Z923 Personal history of irradiation: Secondary | ICD-10-CM | POA: Insufficient documentation

## 2020-11-10 NOTE — Progress Notes (Signed)
I connected with Virginia [name] on 11/10/20 at 12:00 PM EST by telephone visit and verified that I am speaking with the correct person using two identifiers.  I discussed the limitations, risks, security and privacy concerns of performing an evaluation and management service by telemedicine and the availability of in-person appointments. I also discussed with the patient that there may be a patient responsible charge related to this service. The patient expressed understanding and agreed to proceed.  Other persons participating in the visit and their role in the encounter:  n/a  Patient's location:  Home  Provider's location:  Office  Chief Complaint:  Brain metastases [facility])  History of Present Ilness: Virginia [name] describes no clinical changes today.  We again reviewed treatment options for progressive cerebellar metastasis following conversation with LITT team at [facility].  No issues with gait, no headaches.  Continues on decadron 2mg  daily. Observations: Language and cognition at baseline Assessment and Plan: Brain metastases [facility])  We will reach out again to local neurosurgical oncology providers to assess candidacy for stereotactic biopsy.  Discussion will take place during brain/spine tumor board Monday 11/13/20.  Follow Up Instructions: We will give Virginia [name] a call again on Monday following tumor board.  I discussed the assessment and treatment plan with the patient.  The patient was provided an opportunity to ask questions and all were answered.  The patient agreed with the plan and demonstrated understanding of the instructions.    The patient was advised to call back or seek an in-person evaluation if the symptoms worsen or if the condition fails to improve as anticipated.  I provided 5-10 minutes of non-face-to-face time during this enocunter.  Ventura Sellers, MD   I provided 15 minutes of non face-to-face telephone visit time during this encounter, and > 50% was spent  counseling as documented under my assessment & plan.

## 2020-11-14 ENCOUNTER — Inpatient Hospital Stay: Payer: BC Managed Care – PPO

## 2020-11-16 ENCOUNTER — Telehealth: Payer: Self-pay | Admitting: *Deleted

## 2020-11-16 ENCOUNTER — Other Ambulatory Visit: Payer: Self-pay | Admitting: Hematology and Oncology

## 2020-11-16 MED ORDER — XTAMPZA ER 13.5 MG PO C12A: EXTENDED_RELEASE_CAPSULE | ORAL | 0 refills | Status: DC

## 2020-11-16 MED FILL — PENTOXIFYLLINE 400 MG TAB S: 400 | 30 days supply | Qty: 60 | Fill #1

## 2020-11-16 NOTE — Telephone Encounter (Signed)
Received vm message from patient's husband requesting a refill of her Xtampza at [facility]. Last filled on 10/20/20 for # [phone].

## 2020-11-17 ENCOUNTER — Inpatient Hospital Stay: Admission: RE | Admit: 2020-11-17 | Payer: BC Managed Care – PPO | Source: Ambulatory Visit

## 2020-11-17 DIAGNOSIS — Z1231 Encounter for screening mammogram for malignant neoplasm of breast: Secondary | ICD-10-CM

## 2020-11-17 MED FILL — XTAMPZA ER 13.5 MG C12A: 13.5 | 30 days supply | Qty: 60 | Fill #0

## 2020-11-18 ENCOUNTER — Other Ambulatory Visit: Payer: Self-pay | Admitting: Hematology and Oncology

## 2020-11-23 ENCOUNTER — Other Ambulatory Visit: Payer: Self-pay | Admitting: Internal Medicine

## 2020-11-23 ENCOUNTER — Other Ambulatory Visit: Payer: Self-pay | Admitting: Neurosurgery

## 2020-11-23 DIAGNOSIS — Z6826 Body mass index (BMI) 26.0-26.9, adult: Secondary | ICD-10-CM | POA: Diagnosis not present

## 2020-11-23 DIAGNOSIS — I10 Essential (primary) hypertension: Secondary | ICD-10-CM | POA: Diagnosis not present

## 2020-11-23 DIAGNOSIS — C7931 Secondary malignant neoplasm of brain: Secondary | ICD-10-CM | POA: Diagnosis not present

## 2020-11-23 DIAGNOSIS — D496 Neoplasm of unspecified behavior of brain: Secondary | ICD-10-CM | POA: Diagnosis not present

## 2020-11-24 ENCOUNTER — Inpatient Hospital Stay: Payer: BC Managed Care – PPO

## 2020-11-24 ENCOUNTER — Other Ambulatory Visit: Payer: Self-pay

## 2020-11-24 ENCOUNTER — Inpatient Hospital Stay: Payer: BC Managed Care – PPO | Admitting: Hematology and Oncology

## 2020-11-24 VITALS — BP 121/92 | HR 95 | Temp 97.9°F | Resp 15 | Ht 62.0 in | Wt 146.7 lb

## 2020-11-24 DIAGNOSIS — C3431 Malignant neoplasm of lower lobe, right bronchus or lung: Secondary | ICD-10-CM | POA: Diagnosis not present

## 2020-11-24 DIAGNOSIS — Z9049 Acquired absence of other specified parts of digestive tract: Secondary | ICD-10-CM | POA: Diagnosis not present

## 2020-11-24 DIAGNOSIS — R918 Other nonspecific abnormal finding of lung field: Secondary | ICD-10-CM

## 2020-11-24 DIAGNOSIS — R112 Nausea with vomiting, unspecified: Secondary | ICD-10-CM | POA: Diagnosis not present

## 2020-11-24 DIAGNOSIS — Z86718 Personal history of other venous thrombosis and embolism: Secondary | ICD-10-CM | POA: Diagnosis not present

## 2020-11-24 DIAGNOSIS — Z923 Personal history of irradiation: Secondary | ICD-10-CM | POA: Diagnosis not present

## 2020-11-24 DIAGNOSIS — C7931 Secondary malignant neoplasm of brain: Secondary | ICD-10-CM

## 2020-11-24 DIAGNOSIS — C7951 Secondary malignant neoplasm of bone: Secondary | ICD-10-CM

## 2020-11-24 DIAGNOSIS — Z95828 Presence of other vascular implants and grafts: Secondary | ICD-10-CM

## 2020-11-24 DIAGNOSIS — Z5112 Encounter for antineoplastic immunotherapy: Secondary | ICD-10-CM | POA: Diagnosis not present

## 2020-11-24 DIAGNOSIS — Z5111 Encounter for antineoplastic chemotherapy: Secondary | ICD-10-CM | POA: Diagnosis not present

## 2020-11-24 DIAGNOSIS — C3491 Malignant neoplasm of unspecified part of right bronchus or lung: Secondary | ICD-10-CM

## 2020-11-24 DIAGNOSIS — E876 Hypokalemia: Secondary | ICD-10-CM | POA: Diagnosis not present

## 2020-11-24 DIAGNOSIS — J00 Acute nasopharyngitis [common cold]: Secondary | ICD-10-CM | POA: Diagnosis not present

## 2020-11-24 DIAGNOSIS — Z79899 Other long term (current) drug therapy: Secondary | ICD-10-CM | POA: Diagnosis not present

## 2020-11-24 LAB — CBC WITH DIFFERENTIAL (CANCER CENTER ONLY)
Abs Immature Granulocytes: 0.14 10*3/uL — ABNORMAL HIGH (ref 0.00–0.07)
Basophils Absolute: 0 10*3/uL (ref 0.0–0.1)
Basophils Relative: 0 %
Eosinophils Absolute: 0 10*3/uL (ref 0.0–0.5)
Eosinophils Relative: 0 %
HCT: 34.5 % — ABNORMAL LOW (ref 36.0–46.0)
Hemoglobin: 11.7 g/dL — ABNORMAL LOW (ref 12.0–15.0)
Immature Granulocytes: 1 %
Lymphocytes Relative: 12 %
Lymphs Abs: 1.2 10*3/uL (ref 0.7–4.0)
MCH: 33.4 pg (ref 26.0–34.0)
MCHC: 33.9 g/dL (ref 30.0–36.0)
MCV: 98.6 fL (ref 80.0–100.0)
Monocytes Absolute: 1 10*3/uL (ref 0.1–1.0)
Monocytes Relative: 10 %
Neutro Abs: 7.8 10*3/uL — ABNORMAL HIGH (ref 1.7–7.7)
Neutrophils Relative %: 77 %
Platelet Count: 342 10*3/uL (ref 150–400)
RBC: 3.5 MIL/uL — ABNORMAL LOW (ref 3.87–5.11)
RDW: 15.2 % (ref 11.5–15.5)
WBC Count: 10.2 10*3/uL (ref 4.0–10.5)
nRBC: 0 % (ref 0.0–0.2)

## 2020-11-24 LAB — CMP (CANCER CENTER ONLY)
ALT: 39 U/L (ref 0–44)
AST: 20 U/L (ref 15–41)
Albumin: 3.2 g/dL — ABNORMAL LOW (ref 3.5–5.0)
Alkaline Phosphatase: 59 U/L (ref 38–126)
Anion gap: 8 (ref 5–15)
BUN: 17 mg/dL (ref 8–23)
CO2: 25 mmol/L (ref 22–32)
Calcium: 8.7 mg/dL — ABNORMAL LOW (ref 8.9–10.3)
Chloride: 105 mmol/L (ref 98–111)
Creatinine: 0.78 mg/dL (ref 0.44–1.00)
GFR, Estimated: >60 mL/min (ref >60)
Glucose, Bld: 127 mg/dL — ABNORMAL HIGH (ref 70–99)
Potassium: 4.2 mmol/L (ref 3.5–5.1)
Sodium: 138 mmol/L (ref 135–145)
Total Bilirubin: 0.3 mg/dL (ref 0.3–1.2)
Total Protein: 6.6 g/dL (ref 6.5–8.1)

## 2020-11-24 LAB — TSH: TSH: 1.092 u[IU]/mL (ref 0.308–3.960)

## 2020-11-24 MED ORDER — SODIUM CHLORIDE 0.9% FLUSH
10.0000 mL | INTRAVENOUS | Status: DC | PRN
  Administered 2020-11-24: 10 mL
  Filled 2020-11-24: qty 10

## 2020-11-24 MED ORDER — SODIUM CHLORIDE 0.9% FLUSH
10.0000 mL | INTRAVENOUS | Status: DC | PRN
  Administered 2020-11-24: 10 mL via INTRAVENOUS
  Filled 2020-11-24: qty 10

## 2020-11-24 MED ORDER — SODIUM CHLORIDE 0.9 % IV SOLN
500.0000 mg/m2 | Freq: Once | INTRAVENOUS | Status: AC
  Administered 2020-11-24: 800 mg via INTRAVENOUS
  Filled 2020-11-24: qty 20

## 2020-11-24 MED ORDER — SODIUM CHLORIDE 0.9 % IV SOLN
Freq: Once | INTRAVENOUS | Status: AC
  Filled 2020-11-24: qty 250

## 2020-11-24 MED ORDER — PROCHLORPERAZINE MALEATE 10 MG PO TABS
ORAL_TABLET | ORAL | Status: AC
  Filled 2020-11-24: qty 1

## 2020-11-24 MED ORDER — SODIUM CHLORIDE 0.9 % IV SOLN
200.0000 mg | Freq: Once | INTRAVENOUS | Status: AC
  Administered 2020-11-24: 200 mg via INTRAVENOUS
  Filled 2020-11-24: qty 8

## 2020-11-24 MED ORDER — HEPARIN SOD (PORK) LOCK FLUSH 100 UNIT/ML IV SOLN
500.0000 [IU] | Freq: Once | INTRAVENOUS | Status: AC | PRN
  Administered 2020-11-24: 500 [IU]
  Filled 2020-11-24: qty 5

## 2020-11-24 MED ORDER — PROCHLORPERAZINE MALEATE 10 MG PO TABS
10.0000 mg | ORAL_TABLET | Freq: Once | ORAL | Status: AC
  Administered 2020-11-24: 10 mg via ORAL

## 2020-11-24 NOTE — Patient Instructions (Signed)

## 2020-11-24 NOTE — Patient Instructions (Signed)
[facility] Discharge Instructions for Patients Receiving Chemotherapy  Today you received the following chemotherapy agents Pembrolizumab (KEYTRUDA) & Pemetrexed (ALIMTA).  To help prevent nausea and vomiting after your treatment, we encourage you to take your nausea medication as prescribed.   If you develop nausea and vomiting that is not controlled by your nausea medication, call the clinic.   BELOW ARE SYMPTOMS THAT SHOULD BE REPORTED IMMEDIATELY:  *FEVER GREATER THAN 100.5 F  *CHILLS WITH OR WITHOUT FEVER  NAUSEA AND VOMITING THAT IS NOT CONTROLLED WITH YOUR NAUSEA MEDICATION  *UNUSUAL SHORTNESS OF BREATH  *UNUSUAL BRUISING OR BLEEDING  TENDERNESS IN MOUTH AND THROAT WITH OR WITHOUT PRESENCE OF ULCERS  *URINARY PROBLEMS  *BOWEL PROBLEMS  UNUSUAL RASH Items with * indicate a potential emergency and should be followed up as soon as possible.  Feel free to call the clinic should you have any questions or concerns. The clinic phone number is [phone].  Please show the [facility] at check-in to the Emergency Department and triage nurse.  Zoledronic Acid Injection (Hypercalcemia, Oncology) What is this medicine? ZOLEDRONIC ACID (ZOE le dron ik AS id) slows calcium loss from bones. It high calcium levels in the blood from some kinds of cancer. It may be used in other people at risk for bone loss. This medicine may be used for other purposes; ask your health care provider or pharmacist if you have questions. COMMON BRAND NAME(S): Zometa What should I tell my health care provider before I take this medicine? They need to know if you have any of these conditions:  cancer  dehydration  dental disease  kidney disease  liver disease  low levels of calcium in the blood  lung or breathing disease (asthma)  receiving steroids like dexamethasone or prednisone  an unusual or allergic reaction to zoledronic acid, other medicines, foods, dyes, or  preservatives  pregnant or trying to get pregnant  breast-feeding How should I use this medicine? This drug is injected into a vein. It is given by a health care provider in a hospital or clinic setting. Talk to your health care provider about the use of this drug in children. Special care may be needed. Overdosage: If you think you have taken too much of this medicine contact a poison control center or emergency room at once. NOTE: This medicine is only for you. Do not share this medicine with others. What if I miss a dose? Keep appointments for follow-up doses. It is important not to miss your dose. Call your health care provider if you are unable to keep an appointment. What may interact with this medicine?  certain antibiotics given by injection  NSAIDs, medicines for pain and inflammation, like ibuprofen or naproxen  some diuretics like bumetanide, furosemide  teriparatide  thalidomide This list may not describe all possible interactions. Give your health care provider a list of all the medicines, herbs, non-prescription drugs, or dietary supplements you use. Also tell them if you smoke, drink alcohol, or use illegal drugs. Some items may interact with your medicine. What should I watch for while using this medicine? Visit your health care provider for regular checks on your progress. It may be some time before you see the benefit from this drug. Some people who take this drug have severe bone, joint, or muscle pain. This drug may also increase your risk for jaw problems or a broken thigh bone. Tell your health care provider right away if you have severe pain in  your jaw, bones, joints, or muscles. Tell you health care provider if you have any pain that does not go away or that gets worse. Tell your dentist and dental surgeon that you are taking this drug. You should not have major dental surgery while on this drug. See your dentist to have a dental exam and fix any dental problems  before starting this drug. Take good care of your teeth while on this drug. Make sure you see your dentist for regular follow-up appointments. You should make sure you get enough calcium and vitamin D while you are taking this drug. Discuss the foods you eat and the vitamins you take with your health care provider. Check with your health care provider if you have severe diarrhea, nausea, and vomiting, or if you sweat a lot. The loss of too much body fluid may make it dangerous for you to take this drug. You may need blood work done while you are taking this drug. Do not become pregnant while taking this drug. Women should inform their health care provider if they wish to become pregnant or think they might be pregnant. There is potential for serious harm to an unborn child. Talk to your health care provider for more information. What side effects may I notice from receiving this medicine? Side effects that you should report to your doctor or health care provider as soon as possible:  allergic reactions (skin rash, itching or hives; swelling of the face, lips, or tongue)  bone pain  infection (fever, chills, cough, sore throat, pain or trouble passing urine)  jaw pain, especially after dental work  joint pain  kidney injury (trouble passing urine or change in the amount of urine)  low blood pressure (dizziness; feeling faint or lightheaded, falls; unusually weak or tired)  low calcium levels (fast heartbeat; muscle cramps or pain; pain, tingling, or numbness in the hands or feet; seizures)  low magnesium levels (fast, irregular heartbeat; muscle cramp or pain; muscle weakness; tremors; seizures)  low red blood cell counts (trouble breathing; feeling faint; lightheaded, falls; unusually weak or tired)  muscle pain  redness, blistering, peeling, or loosening of the skin, including inside the mouth  severe diarrhea  swelling of the ankles, feet, hands  trouble breathing Side effects  that usually do not require medical attention (report to your doctor or health care provider if they continue or are bothersome):  anxious  constipation  coughing  depressed mood  eye irritation, itching, or pain  fever  general ill feeling or flu-like symptoms  nausea  pain, redness, or irritation at site where injected  trouble sleeping This list may not describe all possible side effects. Call your doctor for medical advice about side effects. You may report side effects to FDA at 1-800-FDA-1088. Where should I keep my medicine? This drug is given in a hospital or clinic. It will not be stored at home. NOTE: This sheet is a summary. It may not cover all possible information. If you have questions about this medicine, talk to your doctor, pharmacist, or health care provider.  2021 Elsevier/Gold Standard (2019-06-11 09:13:00)

## 2020-11-25 ENCOUNTER — Other Ambulatory Visit: Payer: Self-pay | Admitting: Neurosurgery

## 2020-11-25 ENCOUNTER — Encounter: Payer: Self-pay | Admitting: Hematology and Oncology

## 2020-11-25 ENCOUNTER — Telehealth: Payer: Self-pay | Admitting: Hematology and Oncology

## 2020-11-25 ENCOUNTER — Other Ambulatory Visit (HOSPITAL_COMMUNITY): Payer: Self-pay | Admitting: Neurosurgery

## 2020-11-25 ENCOUNTER — Other Ambulatory Visit: Payer: Self-pay | Admitting: Hematology and Oncology

## 2020-11-25 ENCOUNTER — Other Ambulatory Visit: Payer: Self-pay | Admitting: Radiation Therapy

## 2020-11-25 DIAGNOSIS — D496 Neoplasm of unspecified behavior of brain: Secondary | ICD-10-CM

## 2020-11-25 DIAGNOSIS — C3491 Malignant neoplasm of unspecified part of right bronchus or lung: Secondary | ICD-10-CM

## 2020-11-25 NOTE — Progress Notes (Signed)
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 CT of the head showed showed concerning for metastatic disease involving the left cerebellum and right occipital lobe.  2) 09/30/2019: MRI brain confirms mass within the left cerebellum measures 1.6 x 1.3 x 1.5 cm as well as at least 8 metastatic lesions within the supratentorial and infratentorial brain as outlined 3) 10/01/2019: PET CT scan revealed hypermetabolic infrahilar right lower lobe mass with hypermetabolic nodal metastases in the right hilum, mediastinum, right supraclavicular region, left axilla and lower left neck. 4) 10/08/2019: US guided biopsy of the lymph nodes confirms poorly differentiated non-small cell carcinoma. Immunohistochemistry confirms adenocarcinoma of lung primary. PD-L1 and NGS later revealed a MET Exon 14 mutation and TPS of 90%.  5) 10/12/2019: Establish care with Dr. Lorenso Courier  6) 11/04/2019: Day 1 of capmatinib 449m BID 7) 11/09/2019: presented to Rad/Onc with a DVT in LLE. Seen in symptom management clinic and started on apixaban.  8) 12/29/2019: CT C/A/P showed interval decrease in size of mass involving the superior segment of right lower lobe and reduction in mediastinal and left supraclavicular adenopathy though some small spinal lesions were noted in the interim between the two sets of scans 9) 01/25/2020: temporarily stopped capmatinib due to symptoms of nausea/fatigue. Restarted therapy on 01/30/2020 after brief chemo holiday.  10) 03/22/2020: CT C/A/P reveals a mixed picture, with new lung nodule and  increased lymphadenopathy, however response in bone lesions. D/c capmatinib therapy 11) 04/08/2020: start of Carbo/Pem/Pem for 2nd line treatment. Cycle 1 Day 1   12) 04/29/2020: Cycle 2 Day 1 Carbo/Pem/Pem  13) 05/20/2020: Cycle 3 Day 1 Carbo/Pem/Pem  14) 06/09/2020: Cycle 4 Day 1 Carbo/Pem/Pem  15) 06/29/2020: Cycle 5 Day 1 maintenance Pem/Pem  16) 07/20/2020: Cycle 6 Day 1 maintenance Pem/Pem  17) 08/12/2020: Cycle 7 Day 1 maintenance Pem/Pem  18) 09/01/2020: Cycle 8 Day 1 maintenance Pem/Pem  19) 09/21/2020: Cycle 9 Day 1 maintenance Pem/Pem  20) 10/13/2020: Cycle 10 Day 1 maintenance Pem/Pem  21) 11/03/2020: Cycle 11 Day 1 maintenance Pem/Pem  22) 11/25/2020:  Cycle 12 Day 1 maintenance Pem/Pem   Interval History:  PALAILAH SAFLEY89y.o. female with medical history significant for metastatic adenocarcinoma of the lung presents for a follow up visit. The patient's last visit was on 11/03/2020 prior to the start of Cycle 11 Day 1 of Pem/Pem. In the interim since the last visit she has had no new symptoms. Today is Cycle 12 Day 1.   On exam today Mrs. Beckworth companied by her husband.  She reports that she has gained weight and is up to 146 pounds from 140 pounds.  She recently had a head cold and a chest cold and does have some residual wheezing.  She is concerned about the recent MRI scan which showed progressive lesions in the brain for which she is currently being set up for a neurosurgical resection.  Otherwise she has tolerated treatment well with no major side effects or new symptoms.  Her pain has continued to be well controlled [facility] and oxycodones as needed 2-3 times per day. Otherwise she  has had no issues with nausea, vomiting, or diarrhea. A full 10 point ROS is listed below.   MEDICAL HISTORY:  Past Medical History:  Diagnosis Date  . Anemia   . Anxiety   . Concussion 09/28/2019  . DVT (deep venous thrombosis) ([place]) 2021   L leg  . Dyspnea   . GERD  (gastroesophageal reflux disease)   . Hypercholesterolemia    per pt, she does not have elevated lipids  . Hypertension   . met lung ca dx'd 09/2019   mets to spine, hip and brain  . PONV (postoperative nausea and vomiting)   . Tobacco abuse     SURGICAL HISTORY: Past Surgical History:  Procedure Laterality Date  . ABDOMINAL HYSTERECTOMY     partial/ left ovaries  . CHOLECYSTECTOMY    . DILATION AND CURETTAGE OF UTERUS    . IR IMAGING GUIDED PORT INSERTION  10/23/2019  . KYPHOPLASTY N/A 03/15/2020   Procedure: Thoracic Eight KYPHOPLASTY;  Surgeon: Erline Levine, MD;  Location: [place];  Service: Neurosurgery;  Laterality: N/A;  prone   . LIPOMA EXCISION  2018   removed under left breast and right thigh.  . TUBAL LIGATION      ALLERGIES:  has No Known Allergies.  MEDICATIONS:  Current Outpatient Medications  Medication Sig Dispense Refill  . acetaminophen (TYLENOL) 500 MG tablet Take 1,000 mg by mouth every 8 (eight) hours as needed for moderate pain.    Marland Kitchen albuterol (VENTOLIN HFA) 108 (90 Base) MCG/ACT inhaler INHALE 2 PUFFS INTO THE LUNGS EVERY 6 HOURS AS NEEDED FOR WHEEZING OR SHORTNESS OF BREATH (Patient taking differently: Inhale 2 puffs into the lungs every 6 (six) hours as needed for shortness of breath or wheezing.) 6.7 g 11  . ALPRAZolam (XANAX) 0.25 MG tablet Take 1 tablet (0.25 mg total) by mouth 2 (two) times daily as needed. for anxiety (Patient taking differently: Take 0.25 mg by mouth 2 (two) times daily as needed for anxiety.) 60 tablet 5  . amLODipine (NORVASC) 5 MG tablet Take 1 tablet (5 mg total) by mouth daily. 30 tablet 3  . Cyanocobalamin (VITAMIN B12 PO) Take 2 tablets by mouth daily. Gummies    . dexamethasone (DECADRON) 2 MG tablet Take 1 tablet (2 mg total) by mouth daily. 30 tablet 1  . folic acid (FOLVITE) 1 MG tablet TAKE 1 TABLET BY MOUTH DAILY. (Patient taking differently: Take 1 mg by mouth daily.) 90 tablet 1  . lidocaine-prilocaine (EMLA) cream Apply 1  application topically as needed. Apply to port as needed. 30 g 0  . Multiple Vitamins-Minerals (AIRBORNE PO) Take 1 tablet by mouth daily.     Marland Kitchen ofloxacin (OCUFLOX) 0.3 % ophthalmic solution 2 drops each eye 4 times a day for 5 days (Patient taking differently: Place 2 drops into both eyes daily as needed (irritation).) 5 mL 1  . OLANZapine (ZYPREXA) 2.5 MG tablet Take 1 tablet (2.5 mg total) by mouth at bedtime. 90 tablet 1  . oxyCODONE (OXY IR/ROXICODONE) 5 MG immediate release tablet Take 1-2 tablets (5-10 mg total) by mouth every 4 (four) hours as needed for severe pain. 90 tablet 0  . pantoprazole (PROTONIX) 40 MG tablet TAKE ONE TABLET BY MOUTH DAILY (Patient taking differently: Take 40 mg by mouth daily.) 90 tablet 3  . pentoxifylline (TRENTAL) 400 MG CR tablet Take 469m tab daily x 1 week, then 4038mBID; take with food (Patient taking differently: Take 400 mg by mouth in the morning and at  bedtime. Take 485m tab daily x 1 week, then 4018mBID; take with food) 60 tablet 4  . polyethylene glycol (MIRALAX / GLYCOLAX) 17 g packet Take 17 g by mouth 2 (two) times daily.     . Vitamin D3 (VITAMIN D) 25 MCG tablet Take 2,000 Units by mouth daily.    . vitamin E 180 MG (400 UNITS) capsule Take 400 IU daily x 1 week, then 400 IU BID (Patient taking differently: Take 400 Units by mouth in the morning and at bedtime. Take 400 IU daily x 1 week, then 400 IU BID) 60 capsule 4  . XTAMPZA ER 13.5 MG C12A Take 1 pill by mouth every 12 hours (Patient taking differently: Take 13.5 mg by mouth in the morning and at bedtime. Take 1 pill by mouth every 12 hours) 60 capsule 0   No current facility-administered medications for this visit.    REVIEW OF SYSTEMS:   Constitutional: ( - ) fevers, ( - )  chills , ( - ) night sweats Eyes: ( - ) blurriness of vision, ( - ) double vision, ( - ) watery eyes Ears, nose, mouth, throat, and face: ( - ) mucositis, ( - ) sore throat Respiratory: ( - ) cough, ( - ) dyspnea,  ( - ) wheezes Cardiovascular: ( - ) palpitation, ( - ) chest discomfort, ( - ) lower extremity swelling Gastrointestinal:  ( - ) nausea, ( - ) heartburn, ( - ) change in bowel habits Skin: ( - ) abnormal skin rashes Lymphatics: ( - ) new lymphadenopathy, ( - ) easy bruising Neurological: ( - ) numbness, ( - ) tingling, ( - ) new weaknesses Behavioral/Psych: ( - ) mood change, ( - ) new changes  All other systems were reviewed with the patient and are negative.  PHYSICAL EXAMINATION: ECOG PERFORMANCE STATUS: 1 - Symptomatic but completely ambulatory  Vitals:   11/24/20 1214  BP: (!) 121/92  Pulse: 95  Resp: 15  Temp: 97.9 F (36.6 C)  SpO2: 99%   Filed Weights   11/24/20 1214  Weight: 146 lb 11.2 oz (66.5 kg)    GENERAL: well appearing middle aged Caucasian female in NAD  SKIN: skin color, texture, turgor are normal, no rashes or significant lesions EYES: conjunctiva are pink and non-injected, sclera clear LUNGS: clear to auscultation and percussion with normal breathing effort HEART: regular rate & rhythm and no murmurs and bilateral + trace pitting lower extremity edema Musculoskeletal: no cyanosis of digits and no clubbing PSYCH: alert & oriented x 3, fluent speech NEURO: no focal motor/sensory deficits   LABORATORY DATA:  I have reviewed the data as listed CBC Latest Ref Rng & Units 11/24/2020 11/03/2020 10/13/2020  WBC 4.0 - 10.5 K/uL 10.2 8.7 7.3  Hemoglobin 12.0 - 15.0 g/dL 11.7(L) 11.6(L) 11.6(L)  Hematocrit 36.0 - 46.0 % 34.5(L) 33.7(L) 33.8(L)  Platelets 150 - 400 K/uL 342 301 280    CMP Latest Ref Rng & Units 11/24/2020 11/03/2020 10/13/2020  Glucose 70 - 99 mg/dL 127(H) 82 95  BUN 8 - 23 mg/dL _0 Creatinine 0.44 - 1.00 mg/dL 0.78 0.67 0.63  Sodium 135 - 145 mmol/L 138 140 142  Potassium 3.5 - 5.1 mmol/L 4.2 3.4(L) 3.8  Chloride 98 - 111 mmol/L 105 108 108  CO2 22 - 32 mmol/L _1 Calcium 8.9 - 10.3 mg/dL 8.7(L) 8.6(L) 9.3  Total Protein 6.5 - 8.1  g/dL 6.6 6.4(L) 6.8  Total Bilirubin 0.3 -  1.2 mg/dL 0.3 0.3 0.2(L)  Alkaline Phos 38 - 126 U/L 59 58 73  AST 15 - 41 U/L _0 ALT 0 - 44 U/L 39 47(H) 35     RADIOGRAPHIC STUDIES: I personally have viewed the radiographic studies below: Table disease from prior, no new lesions or new metastatic disease. MR BRAIN W WO CONTRAST  Addendum Date: 11/11/2020   ADDENDUM REPORT: 11/11/2020 12:06 ADDENDUM: There are several speech recognition errors in the report. In the "new lesions" section, Second sentence should read "previously, there were 4 definite enhancing lesions, possibly with a few other subtle foci." In the "larger lesions" section and in the impression 1, the enlarging lesion is in the left cerebellar hemisphere, not cerebral hemisphere. Electronically Signed   By: Nelson Chimes M.D.   On: 11/11/2020 12:06   Result Date: 11/11/2020 CLINICAL DATA:  Follow-up S RS treatment for metastatic lung cancer. EXAM: MRI HEAD WITHOUT AND WITH CONTRAST TECHNIQUE: Multiplanar, multiecho pulse sequences of the brain and surrounding structures were obtained without and with intravenous contrast. CONTRAST:  27m MULTIHANCE GADOBENATE DIMEGLUMINE 529 MG/ML IV SOLN COMPARISON:  08/18/2020.  04/29/2020. FINDINGS: BRAIN New Lesions: Multiple foci of disease progression within the cerebellum. Previously, therefore definite enhancing lesions, possibly with a few small other subtle foci. On today's study, there are approximately 12 enhancing lesions within the cerebellum. Most of these measure 4 mm or less. See below regarding description of the dominant lesion on the left. Larger lesions: In the left cerebral hemisphere, enlarging enhancing mass measured on the last study at 13 x 19 mm measures 19 x 24 mm today. Central necrosis is present. There is increase edema within the left cerebellum since the previous study. Stable or Smaller lesions: Cerebral hemispheric enhancing foci are stable. Tiny foci of enhancement in  the right occipital lobe axial image 59, right frontoparietal junction axial image 89, left frontal lobe axial image 97 in the left parietal lobe axial image 113 are stable. Other Brain findings: No hydrocephalus. No sign of ischemic infarction no widespread leptomeningeal enhancement. Vascular: Major vessels at the base of the brain show flow. Skull and upper cervical spine: No evidence of osseous metastatic disease. Sinuses/Orbits: No significant sinus phlegm a tori disease. Orbits negative. Other: None IMPRESSION: 1. Multiple new foci of disease progression within the cerebellum. Most of these measure 4 mm or less. On today's study, there are approximately 12 enhancing lesions within the cerebellum. 2. Dominant enlarging mass in the left cerebral hemisphere measuring 19 x 24 mm today compared with 13 x 19 mm on the immediate prior exam with central necrosis and increased edema since the previous study. Taken in total, posterior fossa findings would appear more likely to relate to actual disease progression rather than treatment effect. I do think that conceivably treatment effect could cause this pattern of findings, but particularly due to the numerous new small foci of enhancement, actual metastatic tumor is favored. 3. Other treated lesions within the cerebral hemispheres are stable. Electronically Signed: By: MNelson ChimesM.D. On: 11/05/2020 00:56    ASSESSMENT & PLAN PPAUL TORPEY89y.o. female with medical history significant for metastatic adenocarcinoma of the lung presents for a follow up visit. 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   Today Mrs. Lavallie reports she is currently stable at her baseline.  Her pain continues to be under good control and she is not having any new symptoms at  this time. Overall she is willing and able to proceed with chemotherapy had no reservations about moving forward.  She is also not demonstrating any symptoms of  recurrent VTE or signs or symptoms concerning for worsening brain bleed.  Previously we discussed Carboplatin/Pembrolizumab/Pemetrexed and the expected side effect from these medications.  We discussed the possible side effects of immunotherapy including colitis, hepatitis, dermatitis, and pneumonitis.  Additionally we discussed the side effects of carboplatin which include suppression of blood counts, nephrotoxicity, and nausea.  Additionally we discussed pemetrexed which can also have effects on counts and would require supplementation with vitamin M01 and folic acid.  She voiced her understanding of these side effects and treatment plans moving forward.  I also noted that we would be able to drop the chemotherapy agents that she had intolerance to continue with pembrolizumab alone given her high TPS score.  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 --port in place, patient completed chemotherapy education.  --plan for repeat CT C/A/P in April 2022 (3 months from last scan in Jan 2022) --most recent MRI brain showed progression on brain metastasis. Neurosurgery currently planning for resection. --supportive therapy as listed below.  --RTC q 3 weeks for continued monitoring/chemotherapy treatments.    #Nausea/Vomiting -- provided patient with Zofran 4-12m q8h PRN and compazine 173mq6H for breakthrough PRN. These have not been required since olanzapine was added --continue olanzpaine 2.17m74mO QHS to help with nausea. Marked improvement since the addition of this medication on 01/29/2020.   --continue to monitor   #Hypokalemia, stable --likely 2/2 to decreased PO intake, hypovolemia, and BP medications --given her decreased BP would recommend holding  losartan and hydrochlorothiazide --continue to monitor  #Pain Control, improving --continue oxycodone 17mg17mH PRN. Can increase dose to 10mg39m if pain is severe. She is taking approximately 2-3 pills per day.  --continue Xtampza ER BID for long acting support.  --continue to take with Senokot to prevent opioid induced constipation. 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  --nausea as above   #Brain Metastasis, progressing --appreciate the assistance of Dr. VasloMickeal Skinnerreating her brain metastasis and symptoms. --radiation therapy to the brain completed on 11/16/2019.  --MRI brain on 04/29/2020 showed evidence of small brain bleed. Eliquis therapy was discontinued.  --MRI on 11/04/2020 showed progression of intracranial disease. Neurosurgery planning for resection.  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    No orders of the defined types were placed in this encounter.  All questions were answered. The patient knows to call the clinic with any problems, questions or concerns.  A total of more than 30 minutes were spent on this encounter and over half of that time was spent on counseling and coordination of care as outlined above.   Thiago Ragsdale Ledell PeoplesDepartment of Hematology/Oncology [facility]ill[facility]leDca Diagnostics LLCe: [phone]: [phone]: [email or web address]  11/25/2020 11:34 AM   Literature Support:  GandhLorenza Chickrguez-Abreu D, GadgeDuffy Rhodyip E, DeGareth Morgan  Barrett Shell, Domine M, [address] Hochmair MJ, [employer], [address], Bischoff HG, [place], Grossi F, [employer], Reck M, [place], [place], [place], Rubio-Viqueira B, Novello S, Kurata T, Gray JE, Vida J, Wei Z, Yang  J, Raftopoulos H, [place], [place] [employer]; [id number] Investigators. Pembrolizumab plus Chemotherapy in Metastatic Non-Small-Cell Lung Cancer. [place] Med. 2018 May 31;378(22):2078-2092.  --Median progression-free survival was 8.8 months (95% CI, 7.6 to 9.2) in the pembrolizumab-combination group and 4.9 months (95% CI, 4.7 to 5.5) in the placebo-combination group (hazard ratio for disease progression or death, 0.52; 95% CI, 0.43 to 0.64; P<0.001). Adverse events of grade 3 or higher occurred in 67.2% of the patients in the pembrolizumab-combination group and in 65.8% of those in the placebo-combination group.  Jodene Nam L. Efficacy of pemetrexed-based regimens in advanced non-small cell lung cancer patients with activating epidermal growth factor receptor mutations after tyrosine kinase inhibitor failure: a systematic review. Onco Targets Ther. 2018;11:2121-2129.   --The weighted median PFS, median OS, and ORR for patients treated with pem regimens were 5.09 months, 15.91 months, and 30.19%, respectively. Our systematic review results showed a favorable efficacy profile of pem regimens in NSCLC patients with EGFR mutation after EGFR-TKI failure.

## 2020-11-25 NOTE — Telephone Encounter (Signed)
Scheduled per 3/17 los. Pt will receive an updated appt calendar per next visit appt notes

## 2020-11-29 NOTE — Progress Notes (Signed)
Surgical Instructions    Your procedure is scheduled on December 02, 2020.  Report to [facility] Main Entrance "A" at 05:30 A.M., then check in with the Admitting office.  Call this number if you have problems the morning of surgery:  [phone]   If you have any questions prior to your surgery date call [phone]: Open Monday-Friday 8am-4pm   Remember:  Do not eat or drink after midnight the night before your surgery   Take these medicines the morning of surgery with A SIP OF WATER : Amlodipine (Norvasc) Dexamethasone (Decadron) Pantopraxole (Protonix) Pentoxifylline (Trental) Xtampza ER   If needed: Albuterol (Ventolin HFA) inhaler--bring with you to the hospital Acetaminophen (Tylenol) Alprazolam (Xanax) Ofloxacin (Ocuflox) eye drops Oxycodone (Oxy IR)  As of today, STOP taking any Aspirin (unless otherwise instructed by your surgeon) Aleve, Naproxen, Ibuprofen, Motrin, Advil, Goody's, BC's, all herbal medications, fish oil, and all vitamins.                     Do not wear jewelry, make up, or nail polish            Do not wear lotions, powders, perfumes, or deodorant.            Do not shave 48 hours prior to surgery.             Do not bring valuables to the hospital.            [facility] is not responsible for any belongings or valuables.  Do NOT Smoke (Tobacco/Vaping) or drink Alcohol 24 hours prior to your procedure If you use a CPAP at night, you may bring all equipment for your overnight stay.   Contacts, glasses, dentures or bridgework may not be worn into surgery, please bring cases for these belongings   For patients admitted to the hospital, discharge time will be determined by your treatment team.   Patients discharged the day of surgery will not be allowed to drive home, and someone needs to stay with them for 24 hours.    Special instructions:   [place]- Preparing For Surgery  Before surgery, you can play an important role. Because skin is  not sterile, your skin needs to be as free of germs as possible. You can reduce the number of germs on your skin by washing with CHG (chlorahexidine gluconate) Soap before surgery.  CHG is an antiseptic cleaner which kills germs and bonds with the skin to continue killing germs even after washing.    Oral Hygiene is also important to reduce your risk of infection.  Remember - BRUSH YOUR TEETH THE MORNING OF SURGERY WITH YOUR REGULAR TOOTHPASTE  Please do not use if you have an allergy to CHG or antibacterial soaps. If your skin becomes reddened/irritated stop using the CHG.  Do not shave (including legs and underarms) for at least 48 hours prior to first CHG shower. It is OK to shave your face.  Please follow these instructions carefully.   1. Shower the NIGHT BEFORE SURGERY and the MORNING OF SURGERY  2. If you chose to wash your hair, wash your hair first as usual with your normal shampoo.  3. After you shampoo, rinse your hair and body thoroughly to remove the shampoo.  4. Wash Face and genitals (private parts) with your normal soap.   5.  Shower the NIGHT BEFORE SURGERY and the MORNING OF SURGERY with CHG Soap.   6. Use CHG Soap as you would  any other liquid soap. You can apply CHG directly to the skin and wash gently with a scrungie or a clean washcloth.   7. Apply the CHG Soap to your body ONLY FROM THE NECK DOWN.  Do not use on open wounds or open sores. Avoid contact with your eyes, ears, mouth and genitals (private parts). Wash Face and genitals (private parts)  with your normal soap.   8. Wash thoroughly, paying special attention to the area where your surgery will be performed.  9. Thoroughly rinse your body with warm water from the neck down.  10. DO NOT shower/wash with your normal soap after using and rinsing off the CHG Soap.  11. Pat yourself dry with a CLEAN TOWEL.  12. Wear CLEAN PAJAMAS to bed the night before surgery  13. Place CLEAN SHEETS on your bed the night  before your surgery  14. DO NOT SLEEP WITH PETS.   Day of Surgery: Wear Clean/Comfortable clothing the morning of surgery Do not apply any deodorants/lotions.   Remember to brush your teeth WITH YOUR REGULAR TOOTHPASTE.   Please read over the following fact sheets that you were given.

## 2020-11-30 ENCOUNTER — Encounter (HOSPITAL_COMMUNITY)
Admission: RE | Admit: 2020-11-30 | Discharge: 2020-11-30 | Disposition: A | Discharge status: Home / Self Care | Payer: BC Managed Care – PPO | Source: Ambulatory Visit | Attending: Neurosurgery | Admitting: Neurosurgery

## 2020-11-30 ENCOUNTER — Encounter (HOSPITAL_COMMUNITY): Payer: Self-pay

## 2020-11-30 ENCOUNTER — Other Ambulatory Visit: Payer: Self-pay

## 2020-11-30 DIAGNOSIS — G936 Cerebral edema: Secondary | ICD-10-CM | POA: Diagnosis not present

## 2020-11-30 DIAGNOSIS — I10 Essential (primary) hypertension: Secondary | ICD-10-CM | POA: Insufficient documentation

## 2020-11-30 DIAGNOSIS — Z79891 Long term (current) use of opiate analgesic: Secondary | ICD-10-CM | POA: Diagnosis not present

## 2020-11-30 DIAGNOSIS — Z20822 Contact with and (suspected) exposure to covid-19: Secondary | ICD-10-CM | POA: Insufficient documentation

## 2020-11-30 DIAGNOSIS — Z79899 Other long term (current) drug therapy: Secondary | ICD-10-CM | POA: Diagnosis not present

## 2020-11-30 DIAGNOSIS — I491 Atrial premature depolarization: Secondary | ICD-10-CM | POA: Insufficient documentation

## 2020-11-30 DIAGNOSIS — G9389 Other specified disorders of brain: Secondary | ICD-10-CM | POA: Diagnosis not present

## 2020-11-30 DIAGNOSIS — Z01818 Encounter for other preprocedural examination: Secondary | ICD-10-CM | POA: Insufficient documentation

## 2020-11-30 DIAGNOSIS — C3490 Malignant neoplasm of unspecified part of unspecified bronchus or lung: Secondary | ICD-10-CM | POA: Diagnosis not present

## 2020-11-30 DIAGNOSIS — D630 Anemia in neoplastic disease: Secondary | ICD-10-CM | POA: Diagnosis not present

## 2020-11-30 DIAGNOSIS — C7951 Secondary malignant neoplasm of bone: Secondary | ICD-10-CM | POA: Diagnosis not present

## 2020-11-30 DIAGNOSIS — Z85118 Personal history of other malignant neoplasm of bronchus and lung: Secondary | ICD-10-CM | POA: Diagnosis not present

## 2020-11-30 DIAGNOSIS — C7931 Secondary malignant neoplasm of brain: Secondary | ICD-10-CM | POA: Diagnosis not present

## 2020-11-30 DIAGNOSIS — G0490 Encephalitis and encephalomyelitis, unspecified: Secondary | ICD-10-CM | POA: Diagnosis not present

## 2020-11-30 DIAGNOSIS — Z7952 Long term (current) use of systemic steroids: Secondary | ICD-10-CM | POA: Diagnosis not present

## 2020-11-30 DIAGNOSIS — D496 Neoplasm of unspecified behavior of brain: Secondary | ICD-10-CM | POA: Diagnosis not present

## 2020-11-30 LAB — TYPE AND SCREEN
ABO/RH(D): A POS
Antibody Screen: NEGATIVE

## 2020-11-30 LAB — BASIC METABOLIC PANEL
Anion gap: 6 (ref 5–15)
BUN: 11 mg/dL (ref 8–23)
CO2: 27 mmol/L (ref 22–32)
Calcium: 8.9 mg/dL (ref 8.9–10.3)
Chloride: 103 mmol/L (ref 98–111)
Creatinine, Ser: 0.62 mg/dL (ref 0.44–1.00)
GFR, Estimated: >60 mL/min (ref >60)
Glucose, Bld: 150 mg/dL — ABNORMAL HIGH (ref 70–99)
Potassium: 4 mmol/L (ref 3.5–5.1)
Sodium: 136 mmol/L (ref 135–145)

## 2020-11-30 LAB — CBC
HCT: 37.8 % (ref 36.0–46.0)
Hemoglobin: 12.6 g/dL (ref 12.0–15.0)
MCH: 33.4 pg (ref 26.0–34.0)
MCHC: 33.3 g/dL (ref 30.0–36.0)
MCV: 100.3 fL — ABNORMAL HIGH (ref 80.0–100.0)
Platelets: 240 10*3/uL (ref 150–400)
RBC: 3.77 MIL/uL — ABNORMAL LOW (ref 3.87–5.11)
RDW: 14.7 % (ref 11.5–15.5)
WBC: 7.5 10*3/uL (ref 4.0–10.5)
nRBC: 0 % (ref 0.0–0.2)

## 2020-11-30 LAB — SARS CORONAVIRUS 2 (TAT 6-24 HRS): SARS Coronavirus 2: NEGATIVE

## 2020-11-30 NOTE — Progress Notes (Signed)
PCP - Dr. Alois Cliche Cardiologist - denies Oncologist - Dr. Cecil Cobbs and Dr. Narda Rutherford  PPM/ICD - denies  Chest x-ray - N/A EKG - 11/30/2020 Stress Test - 01/03/2012 ECHO - denies Cardiac Cath - denies  Sleep Study - denies CPAP - N/A  DM: denies  Blood Thinner Instructions: N/A Aspirin Instructions: N/A  ERAS Protcol - No  COVID TEST- 11/30/2020 at PAT appointment. Patient verbalized understanding of self-quarantine instructions.  Anesthesia review: YES, EKG  Patient denies shortness of breath, fever, cough and chest pain at PAT appointment  All instructions explained to the patient, with a verbal understanding of the material. Patient agrees to go over the instructions while at home for a better understanding. Patient also instructed to self quarantine after being tested for COVID-19. The opportunity to ask questions was provided.

## 2020-12-01 ENCOUNTER — Ambulatory Visit (HOSPITAL_COMMUNITY)
Admission: RE | Admit: 2020-12-01 | Discharge: 2020-12-01 | Disposition: A | Discharge status: Home / Self Care | Payer: BC Managed Care – PPO | Source: Ambulatory Visit | Attending: Neurosurgery | Admitting: Neurosurgery

## 2020-12-01 ENCOUNTER — Telehealth: Payer: Self-pay | Admitting: *Deleted

## 2020-12-01 DIAGNOSIS — G936 Cerebral edema: Secondary | ICD-10-CM | POA: Diagnosis not present

## 2020-12-01 DIAGNOSIS — G9389 Other specified disorders of brain: Secondary | ICD-10-CM | POA: Diagnosis not present

## 2020-12-01 DIAGNOSIS — D496 Neoplasm of unspecified behavior of brain: Secondary | ICD-10-CM | POA: Insufficient documentation

## 2020-12-01 DIAGNOSIS — C7931 Secondary malignant neoplasm of brain: Secondary | ICD-10-CM | POA: Diagnosis not present

## 2020-12-01 DIAGNOSIS — Z85118 Personal history of other malignant neoplasm of bronchus and lung: Secondary | ICD-10-CM | POA: Diagnosis not present

## 2020-12-01 MED ORDER — GADOBUTROL 1 MMOL/ML IV SOLN
7.0000 mL | Freq: Once | INTRAVENOUS | Status: AC | PRN
  Administered 2020-12-01: 7 mL via INTRAVENOUS

## 2020-12-01 NOTE — Anesthesia Preprocedure Evaluation (Addendum)
Anesthesia Evaluation  Patient identified by MRN, date of birth, ID band Patient awake    Reviewed: Allergy & Precautions, NPO status , Patient's Chart, lab work & pertinent test results  History of Anesthesia Complications (+) PONV and history of anesthetic complications  Airway Mallampati: I       Dental no notable dental hx.    Pulmonary former smoker,    Pulmonary exam normal        Cardiovascular hypertension, Pt. on medications Normal cardiovascular exam     Neuro/Psych PSYCHIATRIC DISORDERS Anxiety    GI/Hepatic Neg liver ROS, GERD  Medicated,  Endo/Other  negative endocrine ROS  Renal/GU negative Renal ROS  negative genitourinary   Musculoskeletal negative musculoskeletal ROS (+)   Abdominal Normal abdominal exam  (+)   Peds  Hematology   Anesthesia Other Findings   Reproductive/Obstetrics                           Anesthesia Physical Anesthesia Plan  ASA: II  Anesthesia Plan: General   Post-op Pain Management:    Induction:   PONV Risk Score and Plan: 4 or greater and Ondansetron, Dexamethasone and Midazolam  Airway Management Planned: Oral ETT  Additional Equipment: Arterial line  Intra-op Plan:   Post-operative Plan: Extubation in OR  Informed Consent: I have reviewed the patients History and Physical, chart, labs and discussed the procedure including the risks, benefits and alternatives for the proposed anesthesia with the patient or authorized representative who has indicated his/her understanding and acceptance.     Dental advisory given  Plan Discussed with: CRNA  Anesthesia Plan Comments: (PAT note written 12/01/2020 by Myra Gianotti, [name]. )      Anesthesia Quick Evaluation

## 2020-12-01 NOTE — Progress Notes (Signed)
Anesthesia Chart Review:  Case: [id number] Date/Time: 12/02/20 0715   Procedures:      Posterior fossa craniotomy for tumor resection with brainlab (N/A )     APPLICATION OF CRANIAL NAVIGATION (N/A ) - RM 19   Anesthesia type: General   Pre-op diagnosis: Brain tumor   Location: [facility] OR ROOM 21 / [facility] OR   Surgeons: Erline Levine, MD      DISCUSSION: Patient is a 65 year old female scheduled for the above procedure. Possible right lung mass noted on 09/08/19 CXR which was confirmed on chest CT and suggestive of lung cancer. 09/2019 imaging showed brain mets, and PET scan showed bony mets to the rib, vertebral body and iliac bones. 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     History includes former smoker (quit 10/31/12), post-operative N/V, HTN, hypercholesterolemia, dyspnea, anxiety, GERD, anemia, concussion (fall 09/29/19), metastatic lung cancer (diagnosed 09/2019, s/p chemo, cycle 12 Pem/Pem maintenance 11/24/20, Carboplatin dropped after cycle 4; s/p brain radiation completed 11/16/19), DVT (LLE 11/09/19, started on apixaban, discontinued after 04/29/20 after brain MRI showed small bleed), right IJ Porta-a-cath 10/23/19, T8 kyphoplasty 76/21.  11/30/2020 presurgical COVID-19 test negative.  Anesthesia team to evaluate on the day of surgery. By medication list, she is on Decadron 2 mg daily.    VS: BP (!) 143/102   Pulse 95   Temp 36.7 C (Oral)   Resp 18   Ht _0  (1.575 m)   Wt 66.8 kg   SpO2 98%   BMI 26.92 kg/m   BP Readings from Last 3 Encounters:  11/30/20 (!) 143/102  11/24/20 (!) 121/92  11/07/20 (!) 120/91    PROVIDERS: Elby Showers, MD is PCP  Sheran Spine, MD is Lawrence Santiago, MD is Wess Botts, MD is [facility] Baltazar Apo, MD is pulmonologist -She is not followed routinely by cardiology, but was evaluated by Martinique, Peter, MD in 2013 for atypical chest pain and had a normal stress test.    LABS: Labs  reviewed: Acceptable for surgery. (all labs ordered are listed, but only abnormal results are displayed)  Labs Reviewed  CBC - Abnormal; Notable for the following components:      Result Value   RBC 3.77 (*)    MCV 100.3 (*)    All other components within normal limits  BASIC METABOLIC PANEL - Abnormal; Notable for the following components:   Glucose, Bld 150 (*)    All other components within normal limits  SARS CORONAVIRUS 2 (TAT 6-24 HRS)  TYPE AND SCREEN    IMAGES: MRI Brain 11/04/20: IMPRESSION: 1. Multiple new foci of disease progression within the cerebellum. Most of these measure 4 mm or less. On today's study, there are approximately 12 enhancing lesions within the cerebellum. 2. Dominant enlarging mass in the left cerebral hemisphere measuring 19 x 24 mm today compared with 13 x 19 mm on the immediate prior exam with central necrosis and increased edema since the previous study. Taken in total, posterior fossa findings would appear more likely to relate to actual disease progression rather than treatment effect. I do think that conceivably treatment effect could cause this pattern of findings, but particularly due to the numerous new small foci of enhancement, actual metastatic tumor is favored. 3. Other treated lesions within the cerebral hemispheres are stable. ADDENDUM REPORT: 11/11/2020 12:06 - There are several speech recognition errors in the report. In the "new lesions" section, Second sentence should read "previously, there  were 4 definite enhancing lesions, possibly with a few other subtle foci." - In the "larger lesions" section and in the impression 1, the enlarging lesion is in the left cerebellar hemisphere, not cerebral hemisphere. Electronically Signed By: Nelson Chimes M.D.   PET Scan 10/01/19: IMPRESSION: 1. Markedly hypermetabolic infrahilar right lower lobe mass with hypermetabolic nodal metastases in the right hilum, mediastinum, right  supraclavicular region, left axilla and lower left neck. 2. Hypermetabolic rib, vertebral body, and iliac bone lesions consistent with metastatic involvement. 3. Tiny hypermetabolic subcutaneous nodules also concerning for metastatic disease. 4.  Aortic Atherosclerois (ICD10-170.0)   CT Chest/abd/pelvis 09/16/20: IMPRESSION: - No evidence of soft tissue metastatic disease within the chest, abdomen or pelvis. - Stable sclerotic bone metastases. No new or progressive metastatic disease identified. - Stable bilateral ground-glass pulmonary nodules. Recommend continued attention chest on follow-up imaging. - Aortic Atherosclerosis (ICD10-I70.0).   EKG: 11/30/20: Sinus rhythm with Premature atrial complexes Inferior infarct , age undetermined Abnormal ECG Since last tracing rate slower Confirmed by Quay Burow [phone]) on 11/30/2020 4:39:18 PM   CV: Nuclear stress test 01/02/12: Overall Impression:   Normal stress nuclear study. LV Ejection Fraction: 64%.  LV Wall Motion:  NL LV Function; NL Wall Motion   Past Medical History:  Diagnosis Date  . Anemia   . Anxiety   . Concussion 09/28/2019  . DVT (deep venous thrombosis) ([place]) 2021   L leg  . Dyspnea   . GERD (gastroesophageal reflux disease)   . Hypercholesterolemia    per pt, she does not have elevated lipids  . Hypertension   . met lung ca dx'd 09/2019   mets to spine, hip and brain  . PONV (postoperative nausea and vomiting)   . Tobacco abuse     Past Surgical History:  Procedure Laterality Date  . ABDOMINAL HYSTERECTOMY     partial/ left ovaries  . CHOLECYSTECTOMY    . DILATION AND CURETTAGE OF UTERUS    . IR IMAGING GUIDED PORT INSERTION  10/23/2019  . KYPHOPLASTY N/A 03/15/2020   Procedure: Thoracic Eight KYPHOPLASTY;  Surgeon: Erline Levine, MD;  Location: [facility];  Service: Neurosurgery;  Laterality: N/A;  prone   . LIPOMA EXCISION  2018   removed under left breast and right thigh.  . TUBAL LIGATION       MEDICATIONS: . acetaminophen (TYLENOL) 500 MG tablet  . albuterol (VENTOLIN HFA) 108 (90 Base) MCG/ACT inhaler  . ALPRAZolam (XANAX) 0.25 MG tablet  . amLODipine (NORVASC) 5 MG tablet  . Cyanocobalamin (VITAMIN B12 PO)  . dexamethasone (DECADRON) 2 MG tablet  . folic acid (FOLVITE) 1 MG tablet  . lidocaine-prilocaine (EMLA) cream  . Multiple Vitamins-Minerals (AIRBORNE PO)  . ofloxacin (OCUFLOX) 0.3 % ophthalmic solution  . OLANZapine (ZYPREXA) 2.5 MG tablet  . oxyCODONE (OXY IR/ROXICODONE) 5 MG immediate release tablet  . pantoprazole (PROTONIX) 40 MG tablet  . pentoxifylline (TRENTAL) 400 MG CR tablet  . polyethylene glycol (MIRALAX / GLYCOLAX) 17 g packet  . Vitamin D3 (VITAMIN D) 25 MCG tablet  . vitamin E 180 MG (400 UNITS) capsule  . XTAMPZA ER 13.5 MG C12A   No current facility-administered medications for this encounter.    Myra Gianotti, [name] Surgical Short Stay/Anesthesiology [facility] Phone [phone] [facility] Phone [phone] 12/01/2020 10:00 AM

## 2020-12-01 NOTE — Telephone Encounter (Signed)
TCT patient's husband, [name] regarding upcoming CT scan-chest, abd, pelvis. No answer but was able to leave vm message for him with date and time of scan. Advised  That oral contrast will be at the front desk for pick before 12/13/20

## 2020-12-02 ENCOUNTER — Inpatient Hospital Stay (HOSPITAL_COMMUNITY)
Admission: RE | Admit: 2020-12-02 | Discharge: 2020-12-04 | DRG: 025 | Disposition: A | Discharge status: Home / Self Care | Payer: BC Managed Care – PPO | Attending: Neurosurgery | Admitting: Neurosurgery

## 2020-12-02 ENCOUNTER — Inpatient Hospital Stay (HOSPITAL_COMMUNITY): Payer: BC Managed Care – PPO | Admitting: Physician Assistant

## 2020-12-02 ENCOUNTER — Other Ambulatory Visit: Payer: Self-pay

## 2020-12-02 ENCOUNTER — Encounter (HOSPITAL_COMMUNITY)
Admission: RE | Disposition: A | Discharge status: Home / Self Care | Payer: Self-pay | Source: Home / Self Care | Attending: Neurosurgery

## 2020-12-02 ENCOUNTER — Encounter (HOSPITAL_COMMUNITY): Payer: Self-pay | Admitting: Neurosurgery

## 2020-12-02 DIAGNOSIS — Z20822 Contact with and (suspected) exposure to covid-19: Secondary | ICD-10-CM | POA: Diagnosis present

## 2020-12-02 DIAGNOSIS — Z79899 Other long term (current) drug therapy: Secondary | ICD-10-CM | POA: Diagnosis not present

## 2020-12-02 DIAGNOSIS — G936 Cerebral edema: Secondary | ICD-10-CM | POA: Diagnosis present

## 2020-12-02 DIAGNOSIS — C7931 Secondary malignant neoplasm of brain: Secondary | ICD-10-CM | POA: Diagnosis present

## 2020-12-02 DIAGNOSIS — C3490 Malignant neoplasm of unspecified part of unspecified bronchus or lung: Secondary | ICD-10-CM | POA: Diagnosis present

## 2020-12-02 DIAGNOSIS — Z79891 Long term (current) use of opiate analgesic: Secondary | ICD-10-CM | POA: Diagnosis not present

## 2020-12-02 DIAGNOSIS — C7951 Secondary malignant neoplasm of bone: Secondary | ICD-10-CM | POA: Diagnosis present

## 2020-12-02 DIAGNOSIS — Z7952 Long term (current) use of systemic steroids: Secondary | ICD-10-CM

## 2020-12-02 HISTORY — PX: APPLICATION OF CRANIAL NAVIGATION: SHX6578

## 2020-12-02 HISTORY — PX: CRANIOTOMY: SHX93

## 2020-12-02 LAB — MRSA PCR SCREENING: MRSA by PCR: NEGATIVE

## 2020-12-02 LAB — ABO/RH: ABO/RH(D): A POS

## 2020-12-02 SURGERY — CRANIOTOMY TUMOR EXCISION
Anesthesia: General

## 2020-12-02 MED ORDER — THROMBIN 20000 UNITS EX SOLR
CUTANEOUS | Status: DC | PRN
  Administered 2020-12-02: 20 mL via TOPICAL

## 2020-12-02 MED ORDER — ACETAMINOPHEN 325 MG PO TABS
650.0000 mg | ORAL_TABLET | ORAL | Status: DC | PRN
  Filled 2020-12-02: qty 2

## 2020-12-02 MED ORDER — VITAMIN D 25 MCG (1000 UNIT) PO TABS
2000.0000 [IU] | ORAL_TABLET | Freq: Every day | ORAL | Status: DC
  Administered 2020-12-02 – 2020-12-04 (×3): 2000 [IU] via ORAL
  Filled 2020-12-02 (×4): qty 2

## 2020-12-02 MED ORDER — FENTANYL CITRATE (PF) 250 MCG/5ML IJ SOLN
INTRAMUSCULAR | Status: AC
  Filled 2020-12-02: qty 5

## 2020-12-02 MED ORDER — ROCURONIUM BROMIDE 10 MG/ML (PF) SYRINGE
PREFILLED_SYRINGE | INTRAVENOUS | Status: AC
  Filled 2020-12-02: qty 10

## 2020-12-02 MED ORDER — DEXAMETHASONE SODIUM PHOSPHATE 10 MG/ML IJ SOLN
6.0000 mg | Freq: Four times a day (QID) | INTRAMUSCULAR | Status: AC
  Administered 2020-12-02 – 2020-12-03 (×4): 6 mg via INTRAVENOUS
  Filled 2020-12-02 (×4): qty 1

## 2020-12-02 MED ORDER — CHLORHEXIDINE GLUCONATE CLOTH 2 % EX PADS: 6.0000 | MEDICATED_PAD | Freq: Once | CUTANEOUS | Status: DC

## 2020-12-02 MED ORDER — ROCURONIUM BROMIDE 10 MG/ML (PF) SYRINGE
PREFILLED_SYRINGE | INTRAVENOUS | Status: DC | PRN
  Administered 2020-12-02: 40 mg via INTRAVENOUS
  Administered 2020-12-02: 60 mg via INTRAVENOUS
  Administered 2020-12-02: 30 mg via INTRAVENOUS

## 2020-12-02 MED ORDER — ONDANSETRON HCL 4 MG/2ML IJ SOLN: 4.0000 mg | INTRAMUSCULAR | Status: DC | PRN

## 2020-12-02 MED ORDER — PHENYLEPHRINE 40 MCG/ML (10ML) SYRINGE FOR IV PUSH (FOR BLOOD PRESSURE SUPPORT)
PREFILLED_SYRINGE | INTRAVENOUS | Status: DC | PRN
  Administered 2020-12-02: 80 ug via INTRAVENOUS

## 2020-12-02 MED ORDER — BACITRACIN ZINC 500 UNIT/GM EX OINT
TOPICAL_OINTMENT | CUTANEOUS | Status: AC
  Filled 2020-12-02: qty 28.35

## 2020-12-02 MED ORDER — MIDAZOLAM HCL 2 MG/2ML IJ SOLN
INTRAMUSCULAR | Status: DC | PRN
  Administered 2020-12-02: 2 mg via INTRAVENOUS

## 2020-12-02 MED ORDER — ORAL CARE MOUTH RINSE: 15.0000 mL | Freq: Once | OROMUCOSAL | Status: AC

## 2020-12-02 MED ORDER — MICROFIBRILLAR COLL HEMOSTAT EX PADS
MEDICATED_PAD | CUTANEOUS | Status: DC | PRN
  Administered 2020-12-02: 1 via TOPICAL

## 2020-12-02 MED ORDER — VITAMIN E 45 MG (100 UNIT) PO CAPS
400.0000 [IU] | ORAL_CAPSULE | Freq: Every day | ORAL | Status: DC
  Administered 2020-12-03 – 2020-12-04 (×2): 400 [IU] via ORAL
  Filled 2020-12-02 (×2): qty 4

## 2020-12-02 MED ORDER — FENTANYL CITRATE (PF) 250 MCG/5ML IJ SOLN
INTRAMUSCULAR | Status: DC | PRN
  Administered 2020-12-02: 100 ug via INTRAVENOUS
  Administered 2020-12-02 (×2): 50 ug via INTRAVENOUS
  Administered 2020-12-02: 100 ug via INTRAVENOUS
  Administered 2020-12-02 (×2): 50 ug via INTRAVENOUS

## 2020-12-02 MED ORDER — ESMOLOL HCL 100 MG/10ML IV SOLN
INTRAVENOUS | Status: DC | PRN
  Administered 2020-12-02: 10 mg via INTRAVENOUS
  Administered 2020-12-02 (×2): 20 mg via INTRAVENOUS

## 2020-12-02 MED ORDER — ACETAMINOPHEN 650 MG RE SUPP: 650.0000 mg | RECTAL | Status: DC | PRN

## 2020-12-02 MED ORDER — OFLOXACIN 0.3 % OP SOLN
2.0000 [drp] | Freq: Every day | OPHTHALMIC | Status: DC | PRN
  Filled 2020-12-02: qty 5

## 2020-12-02 MED ORDER — THROMBIN 20000 UNITS EX SOLR
CUTANEOUS | Status: AC
  Filled 2020-12-02: qty 20000

## 2020-12-02 MED ORDER — 0.9 % SODIUM CHLORIDE (POUR BTL) OPTIME
TOPICAL | Status: DC | PRN
  Administered 2020-12-02 (×3): 1000 mL

## 2020-12-02 MED ORDER — AMLODIPINE BESYLATE 5 MG PO TABS
5.0000 mg | ORAL_TABLET | Freq: Every day | ORAL | Status: DC
  Administered 2020-12-03 – 2020-12-04 (×2): 5 mg via ORAL
  Filled 2020-12-02 (×3): qty 1

## 2020-12-02 MED ORDER — BACITRACIN ZINC 500 UNIT/GM EX OINT
TOPICAL_OINTMENT | CUTANEOUS | Status: DC | PRN
  Administered 2020-12-02: 1 via TOPICAL

## 2020-12-02 MED ORDER — SUGAMMADEX SODIUM 200 MG/2ML IV SOLN
INTRAVENOUS | Status: DC | PRN
  Administered 2020-12-02: 300 mg via INTRAVENOUS

## 2020-12-02 MED ORDER — HYDROMORPHONE HCL 1 MG/ML IJ SOLN
0.5000 mg | INTRAMUSCULAR | Status: DC | PRN
  Administered 2020-12-02: 1 mg via INTRAVENOUS
  Filled 2020-12-02: qty 1

## 2020-12-02 MED ORDER — MEPERIDINE HCL 25 MG/ML IJ SOLN: 6.2500 mg | INTRAMUSCULAR | Status: DC | PRN

## 2020-12-02 MED ORDER — DEXAMETHASONE SODIUM PHOSPHATE 10 MG/ML IJ SOLN
INTRAMUSCULAR | Status: AC
  Filled 2020-12-02: qty 1

## 2020-12-02 MED ORDER — ACETAMINOPHEN 500 MG PO TABS: 1000.0000 mg | ORAL_TABLET | Freq: Three times a day (TID) | ORAL | Status: DC | PRN

## 2020-12-02 MED ORDER — DEXAMETHASONE 4 MG PO TABS
2.0000 mg | ORAL_TABLET | Freq: Every day | ORAL | Status: DC
  Administered 2020-12-03: 2 mg via ORAL
  Filled 2020-12-02: qty 1

## 2020-12-02 MED ORDER — THROMBIN 5000 UNITS EX SOLR
OROMUCOSAL | Status: DC | PRN
  Administered 2020-12-02: 5 mL via TOPICAL

## 2020-12-02 MED ORDER — HYDROCODONE-ACETAMINOPHEN 5-325 MG PO TABS: 1.0000 | ORAL_TABLET | ORAL | Status: DC | PRN

## 2020-12-02 MED ORDER — ONDANSETRON HCL 4 MG/2ML IJ SOLN
INTRAMUSCULAR | Status: AC
  Filled 2020-12-02: qty 2

## 2020-12-02 MED ORDER — LIDOCAINE 2% (20 MG/ML) 5 ML SYRINGE
INTRAMUSCULAR | Status: DC | PRN
  Administered 2020-12-02: 80 mg via INTRAVENOUS

## 2020-12-02 MED ORDER — ALBUTEROL SULFATE (2.5 MG/3ML) 0.083% IN NEBU: 2.5000 mg | INHALATION_SOLUTION | Freq: Four times a day (QID) | RESPIRATORY_TRACT | Status: DC | PRN

## 2020-12-02 MED ORDER — CHLORHEXIDINE GLUCONATE 0.12 % MT SOLN
15.0000 mL | Freq: Once | OROMUCOSAL | Status: AC
  Administered 2020-12-02: 15 mL via OROMUCOSAL
  Filled 2020-12-02: qty 15

## 2020-12-02 MED ORDER — FOLIC ACID 1 MG PO TABS
1.0000 mg | ORAL_TABLET | Freq: Every day | ORAL | Status: DC
  Administered 2020-12-03 – 2020-12-04 (×2): 1 mg via ORAL
  Filled 2020-12-02 (×3): qty 1

## 2020-12-02 MED ORDER — LABETALOL HCL 5 MG/ML IV SOLN
5.0000 mg | INTRAVENOUS | Status: DC | PRN
Start: 2020-12-02 — End: 2020-12-02

## 2020-12-02 MED ORDER — CEFAZOLIN SODIUM-DEXTROSE 1-4 GM/50ML-% IV SOLN
1.0000 g | Freq: Three times a day (TID) | INTRAVENOUS | Status: AC
  Administered 2020-12-02 (×2): 1 g via INTRAVENOUS
  Filled 2020-12-02 (×2): qty 50

## 2020-12-02 MED ORDER — LABETALOL HCL 5 MG/ML IV SOLN: 10.0000 mg | INTRAVENOUS | Status: DC | PRN

## 2020-12-02 MED ORDER — GLYCOPYRROLATE PF 0.2 MG/ML IJ SOSY
PREFILLED_SYRINGE | INTRAMUSCULAR | Status: DC | PRN
  Administered 2020-12-02: .1 mg via INTRAVENOUS

## 2020-12-02 MED ORDER — ALPRAZOLAM 0.5 MG PO TABS: 0.2500 mg | ORAL_TABLET | Freq: Two times a day (BID) | ORAL | Status: DC | PRN

## 2020-12-02 MED ORDER — ONDANSETRON HCL 4 MG PO TABS: 4.0000 mg | ORAL_TABLET | ORAL | Status: DC | PRN

## 2020-12-02 MED ORDER — THROMBIN 5000 UNITS EX SOLR
CUTANEOUS | Status: AC
  Filled 2020-12-02: qty 5000

## 2020-12-02 MED ORDER — HYDROMORPHONE HCL 1 MG/ML IJ SOLN
INTRAMUSCULAR | Status: AC
  Filled 2020-12-02: qty 1

## 2020-12-02 MED ORDER — BUPIVACAINE HCL (PF) 0.5 % IJ SOLN
INTRAMUSCULAR | Status: DC | PRN
  Administered 2020-12-02: 5 mL

## 2020-12-02 MED ORDER — PROPOFOL 10 MG/ML IV BOLUS
INTRAVENOUS | Status: AC
  Filled 2020-12-02: qty 40

## 2020-12-02 MED ORDER — LIDOCAINE-EPINEPHRINE 1 %-1:100000 IJ SOLN
INTRAMUSCULAR | Status: DC | PRN
  Administered 2020-12-02: 5 mL

## 2020-12-02 MED ORDER — PANTOPRAZOLE SODIUM 40 MG PO TBEC
40.0000 mg | DELAYED_RELEASE_TABLET | Freq: Every day | ORAL | Status: DC
  Administered 2020-12-03 – 2020-12-04 (×2): 40 mg via ORAL
  Filled 2020-12-02 (×3): qty 1

## 2020-12-02 MED ORDER — LIDOCAINE 2% (20 MG/ML) 5 ML SYRINGE
INTRAMUSCULAR | Status: AC
  Filled 2020-12-02: qty 5

## 2020-12-02 MED ORDER — SODIUM CHLORIDE 0.9 % IV SOLN: INTRAVENOUS | Status: DC | PRN

## 2020-12-02 MED ORDER — POLYETHYLENE GLYCOL 3350 17 G PO PACK
17.0000 g | PACK | Freq: Two times a day (BID) | ORAL | Status: DC
  Administered 2020-12-03 – 2020-12-04 (×3): 17 g via ORAL
  Filled 2020-12-02 (×3): qty 1

## 2020-12-02 MED ORDER — PROMETHAZINE HCL 25 MG PO TABS: 12.5000 mg | ORAL_TABLET | ORAL | Status: DC | PRN

## 2020-12-02 MED ORDER — LIDOCAINE-PRILOCAINE 2.5-2.5 % EX CREA
1.0000 "application " | TOPICAL_CREAM | CUTANEOUS | Status: DC | PRN
  Filled 2020-12-02: qty 5

## 2020-12-02 MED ORDER — DOCUSATE SODIUM 100 MG PO CAPS
100.0000 mg | ORAL_CAPSULE | Freq: Two times a day (BID) | ORAL | Status: DC
  Administered 2020-12-02 – 2020-12-04 (×5): 100 mg via ORAL
  Filled 2020-12-02 (×5): qty 1

## 2020-12-02 MED ORDER — DEXAMETHASONE SODIUM PHOSPHATE 4 MG/ML IJ SOLN
4.0000 mg | Freq: Four times a day (QID) | INTRAMUSCULAR | Status: DC
  Filled 2020-12-02: qty 1

## 2020-12-02 MED ORDER — LIDOCAINE-EPINEPHRINE 1 %-1:100000 IJ SOLN
INTRAMUSCULAR | Status: AC
  Filled 2020-12-02: qty 1

## 2020-12-02 MED ORDER — PROPOFOL 10 MG/ML IV BOLUS
INTRAVENOUS | Status: DC | PRN
Start: 2020-12-02 — End: 2020-12-02
  Administered 2020-12-02: 30 mg via INTRAVENOUS
  Administered 2020-12-02: 100 mg via INTRAVENOUS
  Administered 2020-12-02: 30 mg via INTRAVENOUS
  Administered 2020-12-02: 20 mg via INTRAVENOUS
  Administered 2020-12-02 (×2): 50 mg via INTRAVENOUS

## 2020-12-02 MED ORDER — FLEET ENEMA 7-19 GM/118ML RE ENEM: 1.0000 | ENEMA | Freq: Once | RECTAL | Status: DC | PRN

## 2020-12-02 MED ORDER — OXYCODONE HCL 5 MG PO TABS
5.0000 mg | ORAL_TABLET | ORAL | Status: DC | PRN
  Administered 2020-12-02 (×3): 5 mg via ORAL
  Administered 2020-12-03 – 2020-12-04 (×4): 10 mg via ORAL
  Filled 2020-12-02 (×3): qty 2
  Filled 2020-12-02 (×3): qty 1
  Filled 2020-12-02: qty 2

## 2020-12-02 MED ORDER — SODIUM CHLORIDE 0.9 % IV SOLN: INTRAVENOUS | Status: DC

## 2020-12-02 MED ORDER — POTASSIUM CHLORIDE IN NACL 20-0.9 MEQ/L-% IV SOLN
INTRAVENOUS | Status: DC
  Filled 2020-12-02 (×2): qty 1000

## 2020-12-02 MED ORDER — OLANZAPINE 2.5 MG PO TABS
2.5000 mg | ORAL_TABLET | Freq: Every day | ORAL | Status: DC
  Administered 2020-12-02 – 2020-12-03 (×2): 2.5 mg via ORAL
  Filled 2020-12-02 (×3): qty 1

## 2020-12-02 MED ORDER — CEFAZOLIN SODIUM-DEXTROSE 2-4 GM/100ML-% IV SOLN
2.0000 g | INTRAVENOUS | Status: AC
  Administered 2020-12-02: 2 g via INTRAVENOUS
  Filled 2020-12-02: qty 100

## 2020-12-02 MED ORDER — VITAMIN B-12 100 MCG PO TABS
100.0000 ug | ORAL_TABLET | Freq: Every day | ORAL | Status: DC
  Administered 2020-12-02 – 2020-12-04 (×3): 100 ug via ORAL
  Filled 2020-12-02 (×3): qty 1

## 2020-12-02 MED ORDER — GLYCOPYRROLATE PF 0.2 MG/ML IJ SOSY
PREFILLED_SYRINGE | INTRAMUSCULAR | Status: AC
  Filled 2020-12-02: qty 1

## 2020-12-02 MED ORDER — BISACODYL 10 MG RE SUPP: 10.0000 mg | Freq: Every day | RECTAL | Status: DC | PRN

## 2020-12-02 MED ORDER — PROMETHAZINE HCL 25 MG/ML IJ SOLN: 6.2500 mg | INTRAMUSCULAR | Status: DC | PRN

## 2020-12-02 MED ORDER — MIDAZOLAM HCL 2 MG/2ML IJ SOLN
INTRAMUSCULAR | Status: AC
  Filled 2020-12-02: qty 2

## 2020-12-02 MED ORDER — OXYCODONE HCL ER 15 MG PO T12A
15.0000 mg | EXTENDED_RELEASE_TABLET | Freq: Two times a day (BID) | ORAL | Status: DC
  Administered 2020-12-02 – 2020-12-04 (×4): 15 mg via ORAL
  Filled 2020-12-02 (×4): qty 1

## 2020-12-02 MED ORDER — PANTOPRAZOLE SODIUM 40 MG IV SOLR: 40.0000 mg | Freq: Every day | INTRAVENOUS | Status: DC

## 2020-12-02 MED ORDER — ACETAMINOPHEN 10 MG/ML IV SOLN: 1000.0000 mg | Freq: Once | INTRAVENOUS | Status: DC | PRN

## 2020-12-02 MED ORDER — ONDANSETRON HCL 4 MG/2ML IJ SOLN
INTRAMUSCULAR | Status: DC | PRN
  Administered 2020-12-02: 4 mg via INTRAVENOUS

## 2020-12-02 MED ORDER — POLYETHYLENE GLYCOL 3350 17 G PO PACK: 17.0000 g | PACK | Freq: Every day | ORAL | Status: DC | PRN

## 2020-12-02 MED ORDER — DEXAMETHASONE SODIUM PHOSPHATE 10 MG/ML IJ SOLN
INTRAMUSCULAR | Status: DC | PRN
  Administered 2020-12-02: 10 mg via INTRAVENOUS

## 2020-12-02 MED ORDER — BUPIVACAINE HCL (PF) 0.5 % IJ SOLN
INTRAMUSCULAR | Status: AC
  Filled 2020-12-02: qty 30

## 2020-12-02 MED ORDER — PHENYLEPHRINE HCL-NACL 10-0.9 MG/250ML-% IV SOLN
INTRAVENOUS | Status: DC | PRN
  Administered 2020-12-02: 20 ug/min via INTRAVENOUS

## 2020-12-02 MED ORDER — HYDROMORPHONE HCL 1 MG/ML IJ SOLN
0.2500 mg | INTRAMUSCULAR | Status: DC | PRN
  Administered 2020-12-02 (×2): 0.5 mg via INTRAVENOUS

## 2020-12-02 MED ORDER — DEXAMETHASONE SODIUM PHOSPHATE 4 MG/ML IJ SOLN: 4.0000 mg | Freq: Three times a day (TID) | INTRAMUSCULAR | Status: DC

## 2020-12-02 SURGICAL SUPPLY — 86 items
ADH SKN CLS APL DERMABOND .7 (GAUZE/BANDAGES/DRESSINGS) ×1
BAND INSRT 18 STRL LF DISP RB (MISCELLANEOUS) ×2
BAND RUBBER #18 3X1/16 STRL (MISCELLANEOUS) ×2 IMPLANT
BIT DRILL WIRE PASS 1.3MM (BIT) IMPLANT
BLADE CLIPPER SURG (BLADE) ×2 IMPLANT
BNDG CMPR 75X41 PLY HI ABS (GAUZE/BANDAGES/DRESSINGS) ×2
BNDG STRETCH 4X75 STRL LF (GAUZE/BANDAGES/DRESSINGS) ×4 IMPLANT
BUR ACORN 6.0 PRECISION (BURR) ×2 IMPLANT
BUR SPIRAL ROUTER 2.3 (BUR) ×2 IMPLANT
CANISTER SUCT 3000ML PPV (MISCELLANEOUS) ×2 IMPLANT
CARTRIDGE OIL MAESTRO DRILL (MISCELLANEOUS) ×1 IMPLANT
CLIP VESOCCLUDE MED 6/CT (CLIP) IMPLANT
CNTNR URN SCR LID CUP LEK RST (MISCELLANEOUS) ×1 IMPLANT
CONT SPEC 4OZ STRL OR WHT (MISCELLANEOUS) ×2
COVER BURR HOLE 14 (Orthopedic Implant) ×1 IMPLANT
COVER MAYO STAND STRL (DRAPES) IMPLANT
COVER WAND RF STERILE (DRAPES) ×2 IMPLANT
DECANTER SPIKE VIAL GLASS SM (MISCELLANEOUS) ×2 IMPLANT
DERMABOND ADVANCED (GAUZE/BANDAGES/DRESSINGS) ×1
DERMABOND ADVANCED .7 DNX12 (GAUZE/BANDAGES/DRESSINGS) IMPLANT
DIFFUSER DRILL AIR PNEUMATIC (MISCELLANEOUS) ×2 IMPLANT
DRAPE MICROSCOPE LEICA (MISCELLANEOUS) IMPLANT
DRAPE NEUROLOGICAL W/INCISE (DRAPES) ×2 IMPLANT
DRAPE STERI IOBAN 125X83 (DRAPES) IMPLANT
DRAPE WARM FLUID 44X44 (DRAPES) ×2 IMPLANT
DRILL WIRE PASS 1.3MM (BIT)
DRSG OPSITE POSTOP 4X6 (GAUZE/BANDAGES/DRESSINGS) ×1 IMPLANT
DURAPREP 6ML APPLICATOR 50/CS (WOUND CARE) ×2 IMPLANT
ELECT REM PT RETURN 9FT ADLT (ELECTROSURGICAL) ×2
ELECTRODE REM PT RTRN 9FT ADLT (ELECTROSURGICAL) ×1 IMPLANT
EVACUATOR 1/8 PVC DRAIN (DRAIN) IMPLANT
EVACUATOR SILICONE 100CC (DRAIN) IMPLANT
FORCEPS BIPOLAR SPETZLER 8 1.0 (NEUROSURGERY SUPPLIES) ×1 IMPLANT
GAUZE 4X4 16PLY RFD (DISPOSABLE) IMPLANT
GAUZE SPONGE 4X4 12PLY STRL (GAUZE/BANDAGES/DRESSINGS) ×2 IMPLANT
GLOVE BIO SURGEON STRL SZ8 (GLOVE) ×2 IMPLANT
GLOVE BIOGEL PI IND STRL 8.5 (GLOVE) ×1 IMPLANT
GLOVE BIOGEL PI INDICATOR 8.5 (GLOVE) ×1
GLOVE ECLIPSE 8.0 STRL XLNG CF (GLOVE) ×2 IMPLANT
GLOVE EXAM NITRILE XL STR (GLOVE) IMPLANT
GLOVE SRG 8 PF TXTR STRL LF DI (GLOVE) ×1 IMPLANT
GLOVE SURG UNDER POLY LF SZ8 (GLOVE) ×4
GOWN STRL REUS W/ TWL LRG LVL3 (GOWN DISPOSABLE) IMPLANT
GOWN STRL REUS W/ TWL XL LVL3 (GOWN DISPOSABLE) IMPLANT
GOWN STRL REUS W/TWL 2XL LVL3 (GOWN DISPOSABLE) IMPLANT
GOWN STRL REUS W/TWL LRG LVL3 (GOWN DISPOSABLE) ×2
GOWN STRL REUS W/TWL XL LVL3 (GOWN DISPOSABLE) ×6
GRAFT DURAGEN MATRIX 2WX2L ×1 IMPLANT
HEMOSTAT POWDER KIT SURGIFOAM (HEMOSTASIS) ×2 IMPLANT
HEMOSTAT SURGICEL 2X14 (HEMOSTASIS) ×2 IMPLANT
KIT BASIN OR (CUSTOM PROCEDURE TRAY) ×2 IMPLANT
KIT TURNOVER KIT B (KITS) ×2 IMPLANT
MARKER SKIN DUAL TIP RULER LAB (MISCELLANEOUS) ×2 IMPLANT
MARKER SPHERE PSV REFLC 13MM (MARKER) ×5 IMPLANT
NDL HYPO 25X1 1.5 SAFETY (NEEDLE) ×1 IMPLANT
NEEDLE HYPO 25X1 1.5 SAFETY (NEEDLE) ×2 IMPLANT
NS IRRIG 1000ML POUR BTL (IV SOLUTION) ×4 IMPLANT
OIL CARTRIDGE MAESTRO DRILL (MISCELLANEOUS) ×2
PACK CRANIOTOMY CUSTOM (CUSTOM PROCEDURE TRAY) ×2 IMPLANT
PAD ARMBOARD 7.5X6 YLW CONV (MISCELLANEOUS) ×1 IMPLANT
PATTIES SURGICAL .25X.25 (GAUZE/BANDAGES/DRESSINGS) IMPLANT
PATTIES SURGICAL .5 X.5 (GAUZE/BANDAGES/DRESSINGS) IMPLANT
PATTIES SURGICAL .5 X1 (DISPOSABLE) ×1 IMPLANT
PATTIES SURGICAL .5 X3 (DISPOSABLE) IMPLANT
PATTIES SURGICAL 1/4 X 3 (GAUZE/BANDAGES/DRESSINGS) ×1 IMPLANT
PATTIES SURGICAL 1X1 (DISPOSABLE) IMPLANT
PIN MAYFIELD SKULL DISP (PIN) ×2 IMPLANT
SCREW UNIII AXS SD 1.5X4 (Screw) ×4 IMPLANT
SPECIMEN JAR SMALL (MISCELLANEOUS) ×1 IMPLANT
SPONGE NEURO XRAY DETECT 1X3 (DISPOSABLE) IMPLANT
SPONGE SURGIFOAM ABS GEL 100 (HEMOSTASIS) ×2 IMPLANT
STAPLER SKIN PROX WIDE 3.9 (STAPLE) ×2 IMPLANT
SUT ETHILON 3 0 FSL (SUTURE) IMPLANT
SUT NURALON 4 0 TR CR/8 (SUTURE) ×5 IMPLANT
SUT SILK 2 0 PERMA HAND 18 BK (SUTURE) IMPLANT
SUT VIC AB 0 CT1 18XCR BRD8 (SUTURE) IMPLANT
SUT VIC AB 0 CT1 8-18 (SUTURE) ×2
SUT VIC AB 2-0 CP2 18 (SUTURE) ×4 IMPLANT
SUT VIC AB 3-0 SH 8-18 (SUTURE) ×1 IMPLANT
SYR CONTROL 10ML LL (SYRINGE) ×2 IMPLANT
TOWEL GREEN STERILE (TOWEL DISPOSABLE) ×2 IMPLANT
TOWEL GREEN STERILE FF (TOWEL DISPOSABLE) ×2 IMPLANT
TRAY FOLEY MTR SLVR 16FR STAT (SET/KITS/TRAYS/PACK) ×2 IMPLANT
TUBE CONNECTING 12X1/4 (SUCTIONS) ×2 IMPLANT
UNDERPAD 30X36 HEAVY ABSORB (UNDERPADS AND DIAPERS) ×1 IMPLANT
WATER STERILE IRR 1000ML POUR (IV SOLUTION) ×2 IMPLANT

## 2020-12-02 NOTE — Op Note (Signed)
12/02/2020  11:02 AM  PATIENT:  Virginia [name]  65 y.o. 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; enhancing posterior fossa mass lesion   PROCEDURE:  Procedure(s): Posterior fossa craniotomy for tumor resection with brainlab (N/A) APPLICATION OF CRANIAL NAVIGATION (N/A)   SURGEON:  Surgeon(s) and Role:    Erline Levine, MD - Primary    * Dawley, Theodoro Doing, DO - Assisting  PHYSICIAN ASSISTANT:   ASSISTANTS: none   ANESTHESIA:   general  EBL:  75 mL   BLOOD ADMINISTERED:none  DRAINS: none   LOCAL MEDICATIONS USED:  MARCAINE    and LIDOCAINE   SPECIMEN:  Excision  DISPOSITION OF SPECIMEN:  PATHOLOGY  COUNTS:  YES  TOURNIQUET:  * No tourniquets in log *  DICTATION: Patient is 65 year old woman with lung cancer metastasis to brain. She had stereotactic radiosurgery to posterior fossa lesion and now has developed a ring-enhancing lesion, which is growing, with increasing edema on serial scans.  Surgery was recommended to remove lesion and confirm whether it is recurrent tumor or radiation necrosis.   It was elected to take her to surgery for craniotomy for left cerebellar brain mass.  She had preop MRI and CT for use of Curve for surgical localization of tumor.  Procedure:  Following smooth intubation, patient was placed in prone position in pins. Sub-occipital scalp was shaved and prepped and draped in usual sterile fashion after Curve MRI was localized to map tumor location.  Area of planned incision was infiltrated with lidocaine. A linear parasagittal incision was made and carried to expose calvarium.  Skull flap was elevated exposing the dura directly overlying the brain mass.  Dura was opened.  A corticotomy was created overlying the tumor and carried to the lesion.  The Curve and microscope were used to confirm extent of tumor  resection. The lesion appeared devascularized and was resected in its entirety.   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  Hemostasis was assured with irrigation and Surgifoam.  Hemostasis was assured. The dura was closed with 4-0 neurilon sutures along with tack up neurilon sutures. A Duragen onlay graft was placed. The bone flap was replaced with plates, the fascia and galea were closed with 2-0 vicryl sutures and the skin was re approximated with staples.  A sterile occlusive dressing was placed.  Patient was returned to a supine position and taken out of head pins, then extubated in the operating room, having tolerated surgery well.  Counts were correct at the end of the case.  PLAN OF CARE: Admit to inpatient   PATIENT DISPOSITION:  PACU - hemodynamically stable.   Delay start of Pharmacological VTE agent (>24hrs) due to surgical blood loss or risk of bleeding: yes

## 2020-12-02 NOTE — H&P (Signed)
Patient ID:   [id number] Patient: Virginia [name]  Date of Birth: Jan 15, 1956 Visit Type: Office Visit   Date: 11/23/2020 01:15 PM Provider: Marchia Meiers. Vertell Limber MD   This 65 year old female presents for brain tumor.  HISTORY OF PRESENT ILLNESS: 1.  brain tumor  The patient returns today to review her new MRI of her brain.  This shows that there has been progressive enlargement of the left cerebellar lesion which is either felt to be consistent with progressive metastatic cancer or radiation necrosis.  The brain tumor conference discussed her progressive disease and have recommended surgery with either biopsy or resection of this lesion.  They do not feel that LI TT is a reasonable option.  She has multiple additional small lesions which may represent metastatic deposits.  I reviewed the situation with the patient and discuss treatment options.  We have recommended proceeding with resection of this ring enhancing left cerebellar abnormal lesion.  She wishes to go ahead with surgery.  This is being set up for next week.  We discussed risks and benefits of surgery and she wishes to proceed.  We will obtain volumetric imaging of her brain both on MRI and CT to better assist with navigation during the surgical procedure.      Medical/Surgical/Interim History Reviewed, no change.     Family History: Reviewed, no changes.    Social History: Reviewed, no changes.   MEDICATIONS: (added, continued or stopped this visit) Started Medication Directions Instruction Stopped  Delsym Cough Plus Day-Night 12.5 mg-5 mg-325 mg/10 mL(nt) oral liquid take as needed    dexamethasone 2 mg tablet take 1 tablet by oral route 2 times every day    hydrochlorothiazide 25 mg tablet take 1 tablet by oral route  every day    losartan 100 mg tablet take 1 tablet by oral route  every day   03/16/2020 methocarbamol 500 mg tablet take 1 tablet by oral route 3 times per day prn  spasm    oxycodone 5 mg tablet take 1 tablet by oral route  every 6 hours as needed    pantoprazole sodium take 1 tablet by oral route  every day    rosuvastatin 5 mg tablet take 1 tablet by oral route 3 times every week    Tylenol Extra Strength 500 mg tablet take 2 tablet by oral route  every 6 hours as needed    Ventolin HFA 90 mcg/actuation aerosol inhaler inhale 2 puff by inhalation route  every 4 hours as needed   04/28/2020 Xtampza ER 13.5 mg capsule sprinkle take 1 capsule by oral route  every 12 hours For pain from cancer metastasis to spine     ALLERGIES: Ingredient Reaction Medication Name Comment NO KNOWN ALLERGIES    No known allergies. Reviewed, no changes.    PHYSICAL EXAM:  Vitals Date Temp F BP Pulse Ht In Wt Lb BMI BSA Pain Score 11/23/2020  137/90 90 62 146.4 26.78  0/10     IMPRESSION:  The patient has an enlarging left posterior fossa mass lesion which may reflect recurrent metastatic disease or radiation necrosis.  In addition she has multiple small posterior fossa lesions which are not causing severe mass effect.  PLAN: Proceed with left cerebellar craniotomy and resection of this abnormal lesion.  This may reflect either radiation necrosis or progression of metastatic disease  Orders: Instruction(s)/Education: Assessment Instruction I10 Lifestyle education [phone] Dietary management education, guidance, and counseling  Completed Orders (this encounter) Order Details Reason Side Interpretation  Result Initial Treatment Date Region Lifestyle education Patient will follow up with Primary Care Physician.       Dietary management education, guidance, and counseling Encouraged patient to eat well balanced diet.        Assessment/Plan  # Detail Type Description  1. Assessment Essential (primary) hypertension (I10).     2. Assessment Body mass index (BMI) 26.0-26.9, adult  (U76.54).  Plan Orders Today's instructions / counseling include(s) Dietary management education, guidance, and counseling. Clinical information/comments: Encouraged patient to eat well balanced diet.       Pain Management Plan Pain Scale: 0/10. Method: Numeric Pain Intensity Scale.              Provider:  Marchia Meiers. Vertell Limber MD  11/25/2020 01:28 PM    Dictation edited by: Marchia Meiers. Vertell Limber    CC Providers: [facility] [address] [place],  [place]  [address]-   Sarah Squire  [place], [place] [address]               Electronically signed by Marchia Meiers. Vertell Limber MD on 11/25/2020 01:28 PM   Patient ID:   [id number] Patient: Virginia [name]  Date of Birth: 1955-11-21 Visit Type: Office Visit   Date: 08/22/2020 03:00 PM Provider: Marchia Meiers. Vertell Limber MD   This 65 year old female presents for MRI Review.  HISTORY OF PRESENT ILLNESS: 1.  MRI Review  Cranial MRI demonstrates enlarging cerebellar lesion which was felt by the Brain tumor Board to reflect inflammatory changes secondary to radiation.  They have advised a repeat MRI in 2 months with a week of dexamethasone at 2 mg twice daily for 7 days prior to her scan.  I reviewed the imaging with the patient and her husband and explained that I do not believe that this is a new brain metastasis or growth of than old 1 but that we need to monitor its size and make sure it is not getting larger.  Her back is stable and has actually improved in terms of the severity of her pain.      Medical/Surgical/Interim History Reviewed, no change.     Family History: Reviewed, no changes.    Social History: Reviewed, no changes.   MEDICATIONS: (added, continued or stopped this visit) Started Medication Directions Instruction Stopped  Delsym Cough Plus Day-Night 12.5 mg-5 mg-325 mg/10 mL(nt) oral liquid take as needed    dexamethasone 2 mg tablet take 1 tablet by  oral route 2 times every day    hydrochlorothiazide 25 mg tablet take 1 tablet by oral route  every day    losartan 100 mg tablet take 1 tablet by oral route  every day   03/16/2020 methocarbamol 500 mg tablet take 1 tablet by oral route 3 times per day prn spasm    oxycodone 5 mg tablet take 1 tablet by oral route  every 6 hours as needed    pantoprazole sodium take 1 tablet by oral route  every day    rosuvastatin 5 mg tablet take 1 tablet by oral route 3 times every week    Tylenol Extra Strength 500 mg tablet take 2 tablet by oral route  every 6 hours as needed    Ventolin HFA 90 mcg/actuation aerosol inhaler inhale 2 puff by inhalation route  every 4 hours as needed   04/28/2020 Xtampza ER 13.5 mg capsule sprinkle take 1 capsule by oral route  every 12 hours For pain  from cancer metastasis to spine     ALLERGIES: Ingredient Reaction Medication Name Comment NO KNOWN ALLERGIES    No known allergies. Reviewed, no changes.    PHYSICAL EXAM:  Vitals Date Temp F BP Pulse Ht In Wt Lb BMI BSA Pain Score 08/22/2020  132/91 93 62 136.8 25.02  9/10     IMPRESSION:  The patient brain imaging suggestive of inflammatory post radiation changes  PLAN: Follow-up with repeat brain MRI after 1 week of dexamethasone prior to imaging in 1 month.  Orders: Instruction(s)/Education: Assessment Instruction I10 Hypertension education Z68.25 Lifestyle education regarding diet  Completed Orders (this encounter) Order Details Reason Side Interpretation Result Initial Treatment Date Region Hypertension education Patient will follow-up with primary care physician.       Lifestyle education regarding diet Encouraged patient to eat well balanced diet.        Assessment/Plan  # Detail Type Description  1. Assessment Brain metastases (C79.31).     2. 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     3. Assessment Essential (primary) hypertension (I10).     4. Assessment Body mass index (BMI) 25.0-25.9, adult (V74.82).  Plan Orders Today's instructions / counseling include(s) Lifestyle education regarding diet. Clinical information/comments: Encouraged patient to eat well balanced diet.       Pain Management Plan Pain Scale: 9/10. Method: Numeric Pain Intensity Scale. Location: back. Onset: 09/11/2019. Duration: varies. Quality: discomforting. Pain management follow-up plan of care: Patient will continue medication management..              Provider:  Marchia Meiers. Vertell Limber MD  08/27/2020 01:23 PM    Dictation edited by: Marchia Meiers. Vertell Limber    CC Providers: [facility] [address] [place],  [place]  [address]-   Sarah Squire  [place], [place] [address]               Electronically signed by Marchia Meiers. Vertell Limber MD on 08/27/2020 01:23 PM

## 2020-12-02 NOTE — Progress Notes (Signed)
   Providing Compassionate, Quality Care - Together  NEUROSURGERY PROGRESS NOTE   S: pt s/e in pacu, no complaints  O: EXAM:  BP (!) 144/87 (BP Location: Right Arm)   Pulse 92   Temp (!) 97.5 F (36.4 C)   Resp 14   Ht 5\' 2"  (1.575 m)   Wt 66.8 kg   SpO2 99%   BMI 26.94 kg/m   Awake, alert,  PERRL EOMI Speech fluent, appropriate  CNs grossly intact  5/5 BUE/BLE  LUE dysmetria  ASSESSMENT:  65 y.o. female with   1. 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  Please do not hesitate to call with questions or concerns.   Elwin Sleight, [facility]

## 2020-12-02 NOTE — Progress Notes (Signed)
Arterial line waveform reads higher than cuff pressure but has a whip, OK to go by cuff pressure for parameters SBP<160 per Weston Brass NP.

## 2020-12-02 NOTE — Anesthesia Postprocedure Evaluation (Signed)
Anesthesia Post Note  Patient: Virginia [name]  Procedure(s) Performed: Posterior fossa craniotomy for tumor resection with brainlab (N/A ) APPLICATION OF CRANIAL NAVIGATION (N/A )     Patient location during evaluation: PACU Anesthesia Type: General Level of consciousness: awake and sedated Pain management: pain level controlled Vital Signs Assessment: post-procedure vital signs reviewed and stable Respiratory status: spontaneous breathing Cardiovascular status: stable Postop Assessment: no apparent nausea or vomiting Anesthetic complications: no   No complications documented.  Last Vitals:  Vitals:   12/02/20 1100 12/02/20 1115  BP: (!) 144/87 (!) 140/97  Pulse: 92 90  Resp: 14 14  Temp: (!) 36.4 C   SpO2: 99% 99%    Last Pain:  Vitals:   12/02/20 0638  TempSrc:   PainSc: 0-No pain                 Huston Foley

## 2020-12-02 NOTE — Anesthesia Procedure Notes (Signed)
Procedure Name: Intubation Date/Time: 12/02/2020 8:03 AM Performed by: Rande Brunt, CRNA Pre-anesthesia Checklist: Patient identified, Emergency Drugs available, Suction available and Patient being monitored Patient Re-evaluated:Patient Re-evaluated prior to induction Oxygen Delivery Method: Circle System Utilized Preoxygenation: Pre-oxygenation with 100% oxygen Induction Type: IV induction Ventilation: Mask ventilation without difficulty Laryngoscope Size: Glidescope and 3 Grade View: Grade I Tube type: Oral Tube size: 7.0 mm Number of attempts: 1 Airway Equipment and Method: Stylet and Oral airway Placement Confirmation: ETT inserted through vocal cords under direct vision,  positive ETCO2 and breath sounds checked- equal and bilateral Secured at: 21 cm Tube secured with: Tape Dental Injury: Teeth and Oropharynx as per pre-operative assessment  Difficulty Due To: Difficulty was unanticipated Comments: DVL x 1 with MAC 3 and grade 3b view; oral airway placed and masked with gas and DVL x1 with miller 2 and grade 3 view and cords still shut. IV running and more Roc given and got Glidescope. IV positional but running and with glidescope 3 grade 1 view but cords still not completely relaxed.

## 2020-12-02 NOTE — Transfer of Care (Signed)
Immediate Anesthesia Transfer of Care Note  Patient: Virginia [name]  Procedure(s) Performed: Posterior fossa craniotomy for tumor resection with brainlab (N/A ) APPLICATION OF CRANIAL NAVIGATION (N/A )  Patient Location: PACU  Anesthesia Type:General  Level of Consciousness: awake, alert , oriented and patient cooperative  Airway & Oxygen Therapy: Patient Spontanous Breathing  Post-op Assessment: Report given to RN, Post -op Vital signs reviewed and stable and Patient moving all extremities  Post vital signs: Reviewed and stable  Last Vitals:  Vitals Value Taken Time  BP 160/90   Temp    Pulse 90   Resp 14   SpO2 100     Last Pain:  Vitals:   12/02/20 0638  TempSrc:   PainSc: 0-No pain         Complications: No complications documented.

## 2020-12-02 NOTE — Interval H&P Note (Signed)
History and Physical Interval Note:  12/02/2020 7:34 AM  Virginia [name]  has presented today for surgery, with the diagnosis of Brain tumor.  The various methods of treatment have been discussed with the patient and family. After consideration of risks, benefits and other options for treatment, the patient has consented to  Procedure(s) with comments: Posterior fossa craniotomy for tumor resection with brainlab (N/A) APPLICATION OF CRANIAL NAVIGATION (N/A) - RM 19 as a surgical intervention.  The patient's history has been reviewed, patient examined, no change in status, stable for surgery.  I have reviewed the patient's chart and labs.  Questions were answered to the patient's satisfaction.     Peggyann Shoals

## 2020-12-02 NOTE — Progress Notes (Signed)
Awake, alert, conversant.  MAEW with minimal left sided dysmetria.  Patient is doing well without significant complaints.

## 2020-12-02 NOTE — Brief Op Note (Signed)
12/02/2020  11:02 AM  PATIENT:  Virginia [name] Height  65 y.o. 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; enhancing posterior fossa mass lesion   PROCEDURE:  Procedure(s): Posterior fossa craniotomy for tumor resection with brainlab (N/A) APPLICATION OF CRANIAL NAVIGATION (N/A)   SURGEON:  Surgeon(s) and Role:    Erline Levine, MD - Primary    * Dawley, Theodoro Doing, DO - Assisting  PHYSICIAN ASSISTANT:   ASSISTANTS: none   ANESTHESIA:   general  EBL:  75 mL   BLOOD ADMINISTERED:none  DRAINS: none   LOCAL MEDICATIONS USED:  MARCAINE    and LIDOCAINE   SPECIMEN:  Excision  DISPOSITION OF SPECIMEN:  PATHOLOGY  COUNTS:  YES  TOURNIQUET:  * No tourniquets in log *  DICTATION: Patient is 65 year old woman with lung cancer metastasis to brain. She had stereotactic radiosurgery to posterior fossa lesion and now has developed a ring-enhancing lesion, which is growing, with increasing edema on serial scans.  Surgery was recommended to remove lesion and confirm whether it is recurrent tumor or radiation necrosis.   It was elected to take her to surgery for craniotomy for left cerebellar brain mass.  She had preop MRI and CT for use of Curve for surgical localization of tumor.  Procedure:  Following smooth intubation, patient was placed in prone position in pins. Sub-occipital scalp was shaved and prepped and draped in usual sterile fashion after Curve MRI was localized to map tumor location.  Area of planned incision was infiltrated with lidocaine. A linear parasagittal incision was made and carried to expose calvarium.  Skull flap was elevated exposing the dura directly overlying the brain mass.  Dura was opened.  A corticotomy was created overlying the tumor and carried to the lesion.  The Curve and microscope were used to confirm extent of tumor  resection. The lesion appeared devascularized and was resected in its entirety.   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  Hemostasis was assured with irrigation and Surgifoam.  Hemostasis was assured. The dura was closed with 4-0 neurilon sutures along with tack up neurilon sutures. A Duragen onlay graft was placed. The bone flap was replaced with plates, the fascia and galea were closed with 2-0 vicryl sutures and the skin was re approximated with staples.  A sterile occlusive dressing was placed.  Patient was returned to a supine position and taken out of head pins, then extubated in the operating room, having tolerated surgery well.  Counts were correct at the end of the case.  PLAN OF CARE: Admit to inpatient   PATIENT DISPOSITION:  PACU - hemodynamically stable.   Delay start of Pharmacological VTE agent (>24hrs) due to surgical blood loss or risk of bleeding: yes

## 2020-12-02 NOTE — Anesthesia Procedure Notes (Signed)
Arterial Line Insertion Start/End3/25/2022 7:10 AM, 12/02/2020 7:15 AM Performed by: Rande Brunt, CRNA  Patient location: Pre-op. Preanesthetic checklist: patient identified, IV checked, site marked, risks and benefits discussed, surgical consent, monitors and equipment checked, pre-op evaluation, timeout performed and anesthesia consent Lidocaine 1% used for infiltration Right, radial was placed Catheter size: 20 G Hand hygiene performed  and maximum sterile barriers used   Attempts: 1 Procedure performed without using ultrasound guided technique. Following insertion, dressing applied and Biopatch. Post procedure assessment: normal and unchanged  Patient tolerated the procedure well with no immediate complications.

## 2020-12-03 MED ORDER — DEXAMETHASONE 4 MG PO TABS: 4.0000 mg | ORAL_TABLET | Freq: Three times a day (TID) | ORAL | Status: DC

## 2020-12-03 MED ORDER — CHLORHEXIDINE GLUCONATE CLOTH 2 % EX PADS: 6.0000 | MEDICATED_PAD | Freq: Every day | CUTANEOUS | Status: DC

## 2020-12-03 MED ORDER — DEXAMETHASONE 4 MG PO TABS
4.0000 mg | ORAL_TABLET | Freq: Four times a day (QID) | ORAL | Status: AC
  Administered 2020-12-03 – 2020-12-04 (×4): 4 mg via ORAL
  Filled 2020-12-03 (×4): qty 1

## 2020-12-03 NOTE — Progress Notes (Signed)
Subjective: Patient reports doing well  Objective: Vital signs in last 24 hours: Temp:  [97.5 F (36.4 C)-98.5 F (36.9 C)] 98 F (36.7 C) (03/26 0000) Pulse Rate:  [88-103] 91 (03/26 0100) Resp:  [7-23] 15 (03/26 0100) BP: (108-159)/(79-104) 138/87 (03/26 0100) SpO2:  [89 %-100 %] 89 % (03/26 0100) Arterial Line BP: (95-165)/(72-93) 108/92 (03/26 0100) Weight:  [63.7 kg] 63.7 kg (03/25 1200)  Intake/Output from previous day: 03/25 0701 - 03/26 0700 In: 1657.1 [P.O.:120; I.V.:1437; IV Piggyback:100] Out: 925 [Urine:850; Blood:75] Intake/Output this shift: Total I/O In: 537.3 [I.V.:487.3; IV Piggyback:50] Out: -   Physical Exam: Dressing CDI.  Mild left dysmetria, otherwise normal exam  Lab Results: Recent Labs    11/30/20 1411  WBC 7.5  HGB 12.6  HCT 37.8  PLT 240   BMET Recent Labs    11/30/20 1411  NA 136  K 4.0  CL 103  CO2 27  GLUCOSE 150*  BUN 11  CREATININE 0.62  CALCIUM 8.9    Studies/Results: CT HEAD WO CONTRAST  Result Date: 12/01/2020 CLINICAL DATA:  Brain tumor. Additional history provided: Brain lab protocol, surgery scheduled for tomorrow at 5:30. History of lung cancer. EXAM: CT HEAD WITHOUT CONTRAST TECHNIQUE: Contiguous axial images were obtained from the base of the skull through the vertex without intravenous contrast. COMPARISON:  Brain MRI 11/04/2020. FINDINGS: Brain: Cerebral volume is normal. There are multiple known supratentorial and infratentorial metastatic lesions, better appreciated on the prior brain MRI of 11/04/2020. Most notably, a dominant lesion is present within the left cerebellar hemisphere with surrounding edema. Redemonstrated patchy and ill-defined hypoattenuation within the cerebral white matter, nonspecific but likely reflecting a combination of chronic small vessel ischemic disease and post treatment changes. No evidence of acute intracranial hemorrhage. No demarcated cortical infarct. No extra-axial fluid collection. No  midline shift. Vascular: No hyperdense vessel.  Atherosclerotic calcifications. Skull: Normal. Negative for fracture or focal lesion. Sinuses/Orbits: Partial opacification of a posterior left ethmoid air cell. There is otherwise mild scattered bilateral ethmoid sinus mucosal thickening. Mild mucosal thickening and small mucous retention cysts within the bilateral maxillary sinuses. IMPRESSION: Multiple known supratentorial and infratentorial metastatic lesions, better appreciated on the prior brain MRI of 11/04/2020. Most notably, a dominant lesion is present within the left cerebellar hemisphere with surrounding edema. Redemonstrated patchy and ill-defined hypoattenuation within the cerebral white matter, nonspecific but likely reflecting a combination of chronic small vessel ischemic disease and post treatment changes. Paranasal sinus disease as described. Electronically Signed   By: Kellie Simmering DO   On: 12/01/2020 18:25   MR BRAIN W WO CONTRAST  Result Date: 12/01/2020 CLINICAL DATA:  Brain tumor. Preoperative planning for metastatic lung cancer. EXAM: MRI HEAD WITHOUT AND WITH CONTRAST TECHNIQUE: Multiplanar, multiecho pulse sequences of the brain and surrounding structures were obtained without and with intravenous contrast. CONTRAST:  23mL GADAVIST GADOBUTROL 1 MMOL/ML IV SOLN COMPARISON:  Head CT performed earlier the same day 12/01/2020. Prior brain MRI examinations 11/04/2020 and earlier. FINDINGS: Brain: Mild intermittent motion degradation. Cerebral volume is normal for age. Redemonstration of multiple enhancing supratentorial and infratentorial metastases. As before, the dominant metastasis within the medial left cerebellar hemisphere measures 2.4 x 1.9 cm in transaxial dimensions and demonstrates central necrosis as well as somewhat prominent surrounding edema. Numerous additional small metastases within the bilateral cerebellar hemispheres and vermis, measuring up to approximately 5 mm have not  significantly changed. Redemonstrated subcentimeter enhancing metastases within the right parietal lobe (series 14, image 154), anterior left  frontal lobe (series 14, image 173), right occipital lobe (series 14, image 114) and posterior left frontal lobe (series 103, image 23). No new intracranial metastasis is identified. Background multifocal T2/FLAIR hyperintensity within the cerebral white matter, nonspecific but likely reflecting a combination of chronic small vessel ischemic disease and treatment related changes. There is no acute infarct. No chronic intracranial blood products. No extra-axial fluid collection. No midline shift. Vascular: Expected proximal arterial flow voids. Skull and upper cervical spine: No focal suspicious marrow lesion. Sinuses/Orbits: Visualized orbits show no acute finding. Partial opacification of the posterior left ethmoid air cell. Background mild bilateral ethmoid sinus mucosal thickening. Mild mucosal thickening within the bilateral maxillary sinuses. Superimposed small left maxillary sinus mucous retention cyst. IMPRESSION: Stable examination as compared to the MRI of 11/04/2020. Dominant metastasis within the medial left cerebellum measuring 2.4 x 1.9 cm with somewhat prominent surrounding edema. Numerous additional small enhancing metastases within the bilateral cerebellar hemispheres and vermis measuring up to 5 mm. Subcentimeter enhancing metastases within the right parietal lobe, right occipital lobe, anterior left frontal lobe and posterior left frontal lobe. No new intracranial metastasis is identified. Electronically Signed   By: Kellie Simmering DO   On: 12/01/2020 20:50    Assessment/Plan: Patient is doing well.  Mobilize with PT.  May transfer to stepdown.  Anticipate discharge home in AM.    LOS: 1 day    Peggyann Shoals, MD 12/03/2020, 6:21 AM

## 2020-12-03 NOTE — Plan of Care (Signed)
  Problem: Pain Managment: Goal: General experience of comfort will improve Outcome: Progressing   

## 2020-12-03 NOTE — Evaluation (Signed)
Physical Therapy Evaluation Patient Details Name: Virginia [name] MRN: [id number] DOB: 09-27-1955 Today's Date: 12/03/2020   History of Present Illness  The pt is a 65 yo female presenting 3/25 for craniotomy and resection of tumor from L cerebellum. PMH includes: primary lung adenocarcinoma s/p chemo with metastases to brain, DVT, HTN, anxiety, GERD, and T8 kyphoplasty.    Clinical Impression  Pt in bed upon arrival of PT, agreeable to evaluation at this time. Prior to admission the pt was completely independent with mobility, ADLs, living at home with her spouse in a home with 3-4 steps to enter. The pt now presents with minor  limitations in functional mobility, coordination, and sequencing due to above dx, and will continue to benefit from skilled PT to address these deficits. The pt was able to demo good bed mobility, transfers, and hallway ambulation without physical assist or UE support at this time. The pt was challenged by stairs and gait with narrow base of support (tandem walking), requiring minA to steady and manage coordination of placement of each step. The pt was then challenged by lateral stepping and patterned stepping activities, requiring increased cues to maintain pattern and minA or UE to maintain balance with addition of cognitive challenge. Recommend home with supervision for safety with mobility as well as OP neuro PT to further progress functional stability and independence with dual task.      Follow Up Recommendations Outpatient PT;Supervision for mobility/OOB    Equipment Recommendations  None recommended by PT    Recommendations for Other Services OT consult     Precautions / Restrictions Precautions Precautions: Fall Precaution Comments: low fall Restrictions Weight Bearing Restrictions: No      Mobility  Bed Mobility Overal bed mobility: Independent                  Transfers Overall transfer level: Independent Equipment used: None              General transfer comment: no assist, no evidence of instability, line management only  Ambulation/Gait Ambulation/Gait assistance: Supervision Gait Distance (Feet): 200 Feet Assistive device: None Gait Pattern/deviations: Step-through pattern Gait velocity: 0.7 m/s Gait velocity interpretation: 1.31 - 2.62 ft/sec, indicative of limited community ambulator General Gait Details: pt able to maintain with minG with normal gait, changes in speed, direction, and sudden stops. able to handle moderate perturbations without LOB. challenged by narrow BOS  Stairs Stairs: Yes Stairs assistance: Min guard Stair Management: One rail Left;Forwards;Step to pattern Number of Stairs: 12 General stair comments: no physical assist, able to manage with single UE support     Balance Overall balance assessment: Needs assistance Sitting-balance support: No upper extremity supported Sitting balance-Leahy Scale: Normal     Standing balance support: No upper extremity supported;During functional activity Standing balance-Leahy Scale: Good               High level balance activites: Side stepping;Braiding;Backward walking;Direction changes;Sudden stops;Other (comment) (tandem walking) High Level Balance Comments: pt most challenged by narrow BOS (tandem walking) requiring minA to steady and allow for correct placement of feet. difficulty with lateral stepping/braiding also, requiring increased cues for sequencing repeated with each step Standardized Balance Assessment Standardized Balance Assessment : Dynamic Gait Index   Dynamic Gait Index Level Surface: Normal Change in Gait Speed: Normal Gait with Horizontal Head Turns: Mild Impairment Gait with Vertical Head Turns: Mild Impairment Gait and Pivot Turn: Mild Impairment Step Over Obstacle: Normal Step Around Obstacles: Normal Steps: Mild Impairment Total Score:  20       Pertinent Vitals/Pain Pain Assessment: No/denies pain     Home Living Family/patient expects to be discharged to:: Private residence Living Arrangements: Spouse/significant other Available Help at Discharge: Family;Available 24 hours/day Type of Home: House Home Access: Stairs to enter Entrance Stairs-Rails: Chemical engineer of Steps: 3 Home Layout: Two level;Bed/bath upstairs Home Equipment: Shower seat - built in      Prior Function Level of Independence: Independent               Hand Dominance   Dominant Hand: Right    Extremity/Trunk Assessment   Upper Extremity Assessment Upper Extremity Assessment: Overall WFL for tasks assessed;LUE deficits/detail LUE Deficits / Details: dysmetria and coordination deficits LUE Coordination: decreased fine motor;decreased gross motor    Lower Extremity Assessment Lower Extremity Assessment: Overall WFL for tasks assessed    Cervical / Trunk Assessment Cervical / Trunk Assessment: Normal  Communication   Communication: No difficulties  Cognition Arousal/Alertness: Awake/alert Behavior During Therapy: WFL for tasks assessed/performed Overall Cognitive Status: Within Functional Limits for tasks assessed                                 General Comments: Difficulty with patterned movements, but able to follow all commands, demo good safety awareness, and states her thorugh processes feel normal      General Comments General comments (skin integrity, edema, etc.): VSS on RA. Pt denies change in vision, able to visually track without sx.     Exercises     Assessment/Plan    PT Assessment Patient needs continued PT services  PT Problem List Decreased activity tolerance;Decreased balance;Decreased coordination;Decreased mobility       PT Treatment Interventions Gait training;Stair training;DME instruction;Functional mobility training;Therapeutic activities;Therapeutic exercise;Balance training;Neuromuscular re-education    PT Goals (Current  goals can be found in the Care Plan section)  Acute Rehab PT Goals Patient Stated Goal: return home PT Goal Formulation: With patient Time For Goal Achievement: 12/17/20 Potential to Achieve Goals: Good    Frequency Min 3X/week    AM-PAC PT "6 Clicks" Mobility  Outcome Measure Help needed turning from your back to your side while in a flat bed without using bedrails?: None Help needed moving from lying on your back to sitting on the side of a flat bed without using bedrails?: None Help needed moving to and from a bed to a chair (including a wheelchair)?: A Little Help needed standing up from a chair using your arms (e.g., wheelchair or bedside chair)?: None Help needed to walk in hospital room?: A Little Help needed climbing 3-5 steps with a railing? : A Little 6 Click Score: 21    End of Session Equipment Utilized During Treatment: Gait belt Activity Tolerance: Patient tolerated treatment well Patient left: in bed;with call bell/phone within reach;with SCD's reapplied Nurse Communication: Mobility status PT Visit Diagnosis: Other abnormalities of gait and mobility (R26.89)    Time: 0932-6712 PT Time Calculation (min) (ACUTE ONLY): 26 min   Charges:   PT Evaluation $PT Eval Low Complexity: 1 Low PT Treatments $Gait Training: 8-22 mins        Karma Ganja, PT, DPT   Acute Rehabilitation Department Pager #: [phone]  Otho Bellows 12/03/2020, 3:43 PM

## 2020-12-04 ENCOUNTER — Other Ambulatory Visit (HOSPITAL_COMMUNITY): Payer: Self-pay | Admitting: Neurosurgery

## 2020-12-04 MED ORDER — PROMETHAZINE HCL 12.5 MG PO TABS: 12.5000 mg | ORAL_TABLET | ORAL | 0 refills | Status: DC | PRN

## 2020-12-04 MED ORDER — HYDROCODONE-ACETAMINOPHEN 5-325 MG PO TABS: 1.0000 | ORAL_TABLET | ORAL | 0 refills | Status: DC | PRN

## 2020-12-04 NOTE — TOC Transition Note (Signed)
Transition of Care [facility]) - CM/SW Discharge Note   Patient Details  Name: Virginia [name] MRN: [id number] Date of Birth: Mar 11, 1956  [facility] of [facility]) CM/SW Contact:  Pollie Friar, RN Phone Number: 12/04/2020, 12:08 PM   Clinical Narrative:    Patient discharging home with self care. Recommendations for outpatient therapy that will be arranged at f/u visit with Neurosurgeon. No DME needs.  Pt has transportation home.   Final next level of care: Home/Self Care Barriers to Discharge: No Barriers Identified   Patient Goals and CMS Choice        Discharge Placement                       Discharge Plan and Services                                     Social Determinants of Health (SDOH) Interventions     Readmission Risk Interventions No flowsheet data found.

## 2020-12-04 NOTE — Progress Notes (Signed)
BP goal- SBP <160

## 2020-12-04 NOTE — Progress Notes (Signed)
Patient ordered for DC to home. All discharge instructions reviewed with patient and husband with good understanding verbalized. Patient discharged from unit at this time with all belongings, and script for Norco given.

## 2020-12-04 NOTE — Discharge Summary (Signed)
Physician Discharge Summary  Patient ID: Virginia [name] MRN: [id number] DOB/AGE: 04/28/1956 65 y.o.  Admit date: 12/02/2020 Discharge date: 12/04/2020  Admission Diagnoses:Lung cancer with enhancing, expanding posterior fossa mass  Discharge Diagnoses: Lung cancer with enhancing, expanding posterior fossa mass Active Problems:   Metastasis to brain [facility])   Discharged Condition: good  Hospital Course: Patient was admitted on same day as procedure basis and underwent uncomplicated suboccipital craniectomy with resection of posterior fossa mass using Brainlab MRI/CT navigation.  She tolerated procedure well and was discharged home in AM of POD 2.  Consults: None  Significant Diagnostic Studies: None  Treatments: surgery: Patient underwent uncomplicated suboccipital craniectomy with resection of posterior fossa mass using Brainlab MRI/CT navigation  Discharge Exam: Blood pressure (!) 137/93, pulse 79, temperature 97.9 F (36.6 C), temperature source Oral, resp. rate (!) 9, height 5\' 2"  (1.575 m), weight 63.7 kg, SpO2 94 %. Neurologic: Alert and oriented X 3, normal strength and tone. Normal symmetric reflexes. Normal coordination and gait Wound:CDI  Disposition: Home  Discharge Instructions     Remove dressing in 72 hours   Complete by: As directed    Diet - low sodium heart healthy   Complete by: As directed    Increase activity slowly   Complete by: As directed      Allergies as of 12/04/2020   No Known Allergies     Medication List    TAKE these medications   acetaminophen 500 MG tablet Commonly known as: TYLENOL Take 1,000 mg by mouth every 8 (eight) hours as needed for moderate pain.   AIRBORNE PO Take 1 tablet by mouth daily.   albuterol 108 (90 Base) MCG/ACT inhaler Commonly known as: VENTOLIN HFA INHALE 2 PUFFS INTO THE LUNGS EVERY 6 HOURS AS NEEDED FOR WHEEZING OR SHORTNESS OF BREATH   ALPRAZolam 0.25 MG tablet Commonly known as: XANAX Take 1 tablet  (0.25 mg total) by mouth 2 (two) times daily as needed. for anxiety What changed:   reasons to take this  additional instructions   amLODipine 5 MG tablet Commonly known as: NORVASC Take 1 tablet (5 mg total) by mouth daily.   dexamethasone 2 MG tablet Commonly known as: DECADRON Take 1 tablet (2 mg total) by mouth daily.   folic acid 1 MG tablet Commonly known as: FOLVITE TAKE 1 TABLET BY MOUTH DAILY.   HYDROcodone-acetaminophen 5-325 MG tablet Commonly known as: NORCO/VICODIN Take 1 tablet by mouth every 4 (four) hours as needed for moderate pain.   lidocaine-prilocaine cream Commonly known as: EMLA Apply 1 application topically as needed. Apply to port as needed.   ofloxacin 0.3 % ophthalmic solution Commonly known as: Ocuflox 2 drops each eye 4 times a day for 5 days What changed:   how much to take  how to take this  when to take this  reasons to take this  additional instructions   OLANZapine 2.5 MG tablet Commonly known as: ZYPREXA Take 1 tablet (2.5 mg total) by mouth at bedtime.   oxyCODONE 5 MG immediate release tablet Commonly known as: Oxy IR/ROXICODONE Take 1-2 tablets (5-10 mg total) by mouth every 4 (four) hours as needed for severe pain.   pantoprazole 40 MG tablet Commonly known as: PROTONIX TAKE ONE TABLET BY MOUTH DAILY   pentoxifylline 400 MG CR tablet Commonly known as: TRENTAL Take 400mg  tab daily x 1 week, then 400mg  BID; take with food What changed:   how much to take  how to take this  when  to take this   polyethylene glycol 17 g packet Commonly known as: MIRALAX / GLYCOLAX Take 17 g by mouth 2 (two) times daily.   promethazine 12.5 MG tablet Commonly known as: PHENERGAN Take 1-2 tablets (12.5-25 mg total) by mouth every 4 (four) hours as needed for refractory nausea / vomiting.   VITAMIN B12 PO Take 2 tablets by mouth daily. Gummies   Vitamin D3 25 MCG tablet Commonly known as: Vitamin D Take 2,000 Units by mouth  daily.   vitamin E 180 MG (400 UNITS) capsule Take 400 IU daily x 1 week, then 400 IU BID What changed:   how much to take  how to take this  when to take this   Xtampza ER 13.5 MG C12a Generic drug: oxyCODONE ER Take 1 pill by mouth every 12 hours What changed:   how much to take  how to take this  when to take this        Signed: Peggyann Shoals, MD 12/04/2020, 7:38 AM

## 2020-12-04 NOTE — Progress Notes (Signed)
OT Cancellation Note  Patient Details Name: Virginia [name] MRN: [id number] DOB: 1955-11-29   Cancelled Treatment:    Reason Eval/Treat Not Completed: OT screened, no needs identified, will sign off (Pt does not require skilled OT as pt has been ad lib and assist only for lines. Pt denied need for OT eval at this time. Pt reports that she has had not difficulty with ADL or mobility and that her spouse is very supportive.)   Jefferey Pica, OTR/L Acute Rehabilitation Services Pager: [phone] Office: [phone]   Artesha Wemhoff C 12/04/2020, 11:02 AM

## 2020-12-04 NOTE — Progress Notes (Signed)
Subjective: Patient reports doing well.  No pain or headache  Objective: Vital signs in last 24 hours: Temp:  [97.7 F (36.5 C)-98.2 F (36.8 C)] 97.9 F (36.6 C) (03/27 0400) Pulse Rate:  [79-120] 79 (03/27 0600) Resp:  [9-26] 9 (03/27 0600) BP: (112-159)/(78-114) 137/93 (03/27 0600) SpO2:  [94 %-98 %] 94 % (03/27 0600)  Intake/Output from previous day: 03/26 0701 - 03/27 0700 In: 280 [I.V.:280] Out: 350 [Urine:350] Intake/Output this shift: No intake/output data recorded.  Physical Exam: Dysmetria improved.  Dressing CDI.  Lab Results: No results for input(s): WBC, HGB, HCT, PLT in the last 72 hours. BMET No results for input(s): NA, K, CL, CO2, GLUCOSE, BUN, CREATININE, CALCIUM in the last 72 hours.  Studies/Results: No results found.  Assessment/Plan: Patient is doing well.  Discharge home.    LOS: 2 days    Peggyann Shoals, MD 12/04/2020, 7:37 AM

## 2020-12-05 ENCOUNTER — Telehealth: Payer: Self-pay | Admitting: *Deleted

## 2020-12-05 ENCOUNTER — Other Ambulatory Visit: Payer: Self-pay | Admitting: Radiation Therapy

## 2020-12-05 ENCOUNTER — Inpatient Hospital Stay: Payer: BC Managed Care – PPO

## 2020-12-05 ENCOUNTER — Other Ambulatory Visit: Payer: Self-pay

## 2020-12-05 DIAGNOSIS — C7931 Secondary malignant neoplasm of brain: Secondary | ICD-10-CM

## 2020-12-05 LAB — SURGICAL PATHOLOGY

## 2020-12-05 MED FILL — PROMETHAZINE 12.5 MG TABLET: 12.5 | 2 days supply | Qty: 30 | Fill #0

## 2020-12-05 NOTE — Progress Notes (Signed)
Order placed to access [place] the day of brain MRI at [facility].   Mont Dutton R.T.(R)(T) Radiation Special Procedures Navigator

## 2020-12-05 NOTE — Telephone Encounter (Signed)
Transition Care Management Follow-up Telephone Call  Date of discharge and from where: 12/04/2020 -  [facility]  How have you been since you were released from the hospital? "I am doing okay"  Any questions or concerns? No  Items Reviewed:  Did the pt receive and understand the discharge instructions provided? Yes   Medications obtained and verified? Yes   Other? No   Any new allergies since your discharge? No   Dietary orders reviewed? No  Do you have support at home? Yes   Home Care and Equipment/Supplies: Were home health services ordered? not applicable If so, what is the name of the agency? N/A  Has the agency set up a time to come to the patient's home? not applicable Were any new equipment or medical supplies ordered?  No What is the name of the medical supply agency? N/A Were you able to get the supplies/equipment? not applicable Do you have any questions related to the use of the equipment or supplies? No  Functional Questionnaire: (I = Independent and D = Dependent) ADLs: I  Bathing/Dressing- I  Meal Prep- I  Eating- I  Maintaining continence- I  Transferring/Ambulation- I  Managing Meds- I  Follow up appointments reviewed:   PCP Hospital f/u appt confirmed? No    Specialist Hospital f/u appt confirmed? Yes  Scheduled to see Oncology on 12/15/2020 @ 1315.  Are transportation arrangements needed? No   If their condition worsens, is the pt aware to call PCP or go to the Emergency Dept.? Yes  Was the patient provided with contact information for the PCP's office or ED? Yes  Was to pt encouraged to call back with questions or concerns? Yes

## 2020-12-06 ENCOUNTER — Other Ambulatory Visit (HOSPITAL_COMMUNITY): Payer: Self-pay | Admitting: Neurosurgery

## 2020-12-06 ENCOUNTER — Encounter (HOSPITAL_COMMUNITY): Payer: Self-pay | Admitting: Neurosurgery

## 2020-12-10 ENCOUNTER — Other Ambulatory Visit (HOSPITAL_COMMUNITY): Payer: Self-pay

## 2020-12-10 MED FILL — Dexamethasone Tab 2 MG: ORAL | 30 days supply | Qty: 30 | Fill #0 | Status: AC

## 2020-12-10 MED FILL — Ondansetron HCl Tab 4 MG: ORAL | 15 days supply | Qty: 60 | Fill #0 | Status: AC

## 2020-12-11 ENCOUNTER — Other Ambulatory Visit (HOSPITAL_COMMUNITY): Payer: Self-pay

## 2020-12-12 ENCOUNTER — Other Ambulatory Visit (HOSPITAL_COMMUNITY): Payer: Self-pay

## 2020-12-13 ENCOUNTER — Other Ambulatory Visit: Payer: Self-pay

## 2020-12-13 ENCOUNTER — Ambulatory Visit (HOSPITAL_COMMUNITY)
Admission: RE | Admit: 2020-12-13 | Discharge: 2020-12-13 | Disposition: A | Discharge status: Home / Self Care | Payer: BC Managed Care – PPO | Source: Ambulatory Visit | Attending: Hematology and Oncology | Admitting: Hematology and Oncology

## 2020-12-13 DIAGNOSIS — C3491 Malignant neoplasm of unspecified part of right bronchus or lung: Secondary | ICD-10-CM | POA: Insufficient documentation

## 2020-12-13 MED ORDER — IOHEXOL 300 MG/ML  SOLN
100.0000 mL | Freq: Once | INTRAMUSCULAR | Status: AC | PRN
  Administered 2020-12-13: 100 mL via INTRAVENOUS

## 2020-12-15 ENCOUNTER — Inpatient Hospital Stay: Payer: BC Managed Care – PPO

## 2020-12-15 ENCOUNTER — Inpatient Hospital Stay (HOSPITAL_BASED_OUTPATIENT_CLINIC_OR_DEPARTMENT_OTHER): Payer: BC Managed Care – PPO | Admitting: Hematology and Oncology

## 2020-12-15 ENCOUNTER — Encounter: Payer: Self-pay | Admitting: Hematology and Oncology

## 2020-12-15 ENCOUNTER — Other Ambulatory Visit: Payer: Self-pay

## 2020-12-15 ENCOUNTER — Other Ambulatory Visit (HOSPITAL_COMMUNITY): Payer: Self-pay

## 2020-12-15 ENCOUNTER — Other Ambulatory Visit: Payer: Self-pay | Admitting: Hematology and Oncology

## 2020-12-15 ENCOUNTER — Inpatient Hospital Stay: Payer: BC Managed Care – PPO | Attending: Hematology and Oncology

## 2020-12-15 VITALS — BP 151/94 | HR 98 | Temp 97.9°F | Resp 17 | Ht 62.0 in | Wt 144.0 lb

## 2020-12-15 DIAGNOSIS — C7931 Secondary malignant neoplasm of brain: Secondary | ICD-10-CM

## 2020-12-15 DIAGNOSIS — Z8249 Family history of ischemic heart disease and other diseases of the circulatory system: Secondary | ICD-10-CM | POA: Diagnosis not present

## 2020-12-15 DIAGNOSIS — R52 Pain, unspecified: Secondary | ICD-10-CM | POA: Diagnosis not present

## 2020-12-15 DIAGNOSIS — R918 Other nonspecific abnormal finding of lung field: Secondary | ICD-10-CM

## 2020-12-15 DIAGNOSIS — Z5112 Encounter for antineoplastic immunotherapy: Secondary | ICD-10-CM | POA: Diagnosis not present

## 2020-12-15 DIAGNOSIS — G939 Disorder of brain, unspecified: Secondary | ICD-10-CM | POA: Diagnosis not present

## 2020-12-15 DIAGNOSIS — K5730 Diverticulosis of large intestine without perforation or abscess without bleeding: Secondary | ICD-10-CM | POA: Insufficient documentation

## 2020-12-15 DIAGNOSIS — C3431 Malignant neoplasm of lower lobe, right bronchus or lung: Secondary | ICD-10-CM | POA: Insufficient documentation

## 2020-12-15 DIAGNOSIS — E876 Hypokalemia: Secondary | ICD-10-CM | POA: Diagnosis not present

## 2020-12-15 DIAGNOSIS — Z79899 Other long term (current) drug therapy: Secondary | ICD-10-CM | POA: Diagnosis not present

## 2020-12-15 DIAGNOSIS — C3490 Malignant neoplasm of unspecified part of unspecified bronchus or lung: Secondary | ICD-10-CM

## 2020-12-15 DIAGNOSIS — Z95828 Presence of other vascular implants and grafts: Secondary | ICD-10-CM

## 2020-12-15 DIAGNOSIS — Z5111 Encounter for antineoplastic chemotherapy: Secondary | ICD-10-CM | POA: Insufficient documentation

## 2020-12-15 DIAGNOSIS — C7951 Secondary malignant neoplasm of bone: Secondary | ICD-10-CM | POA: Diagnosis not present

## 2020-12-15 DIAGNOSIS — Z9049 Acquired absence of other specified parts of digestive tract: Secondary | ICD-10-CM | POA: Diagnosis not present

## 2020-12-15 DIAGNOSIS — Z923 Personal history of irradiation: Secondary | ICD-10-CM | POA: Insufficient documentation

## 2020-12-15 DIAGNOSIS — I700 Atherosclerosis of aorta: Secondary | ICD-10-CM | POA: Diagnosis not present

## 2020-12-15 DIAGNOSIS — I10 Essential (primary) hypertension: Secondary | ICD-10-CM | POA: Diagnosis not present

## 2020-12-15 DIAGNOSIS — Z814 Family history of other substance abuse and dependence: Secondary | ICD-10-CM | POA: Insufficient documentation

## 2020-12-15 DIAGNOSIS — Z87891 Personal history of nicotine dependence: Secondary | ICD-10-CM | POA: Insufficient documentation

## 2020-12-15 DIAGNOSIS — Z809 Family history of malignant neoplasm, unspecified: Secondary | ICD-10-CM | POA: Insufficient documentation

## 2020-12-15 LAB — CMP (CANCER CENTER ONLY)
ALT: 54 U/L — ABNORMAL HIGH (ref 0–44)
AST: 26 U/L (ref 15–41)
Albumin: 3.3 g/dL — ABNORMAL LOW (ref 3.5–5.0)
Alkaline Phosphatase: 46 U/L (ref 38–126)
Anion gap: 12 (ref 5–15)
BUN: 15 mg/dL (ref 8–23)
CO2: 23 mmol/L (ref 22–32)
Calcium: 8.3 mg/dL — ABNORMAL LOW (ref 8.9–10.3)
Chloride: 105 mmol/L (ref 98–111)
Creatinine: 0.75 mg/dL (ref 0.44–1.00)
GFR, Estimated: >60 mL/min (ref >60)
Glucose, Bld: 111 mg/dL — ABNORMAL HIGH (ref 70–99)
Potassium: 4.1 mmol/L (ref 3.5–5.1)
Sodium: 140 mmol/L (ref 135–145)
Total Bilirubin: 0.2 mg/dL — ABNORMAL LOW (ref 0.3–1.2)
Total Protein: 6.3 g/dL — ABNORMAL LOW (ref 6.5–8.1)

## 2020-12-15 LAB — CBC WITH DIFFERENTIAL (CANCER CENTER ONLY)
Abs Immature Granulocytes: 0.14 10*3/uL — ABNORMAL HIGH (ref 0.00–0.07)
Basophils Absolute: 0 10*3/uL (ref 0.0–0.1)
Basophils Relative: 0 %
Eosinophils Absolute: 0.1 10*3/uL (ref 0.0–0.5)
Eosinophils Relative: 0 %
HCT: 33.3 % — ABNORMAL LOW (ref 36.0–46.0)
Hemoglobin: 11.6 g/dL — ABNORMAL LOW (ref 12.0–15.0)
Immature Granulocytes: 1 %
Lymphocytes Relative: 9 %
Lymphs Abs: 1.4 10*3/uL (ref 0.7–4.0)
MCH: 33.9 pg (ref 26.0–34.0)
MCHC: 34.8 g/dL (ref 30.0–36.0)
MCV: 97.4 fL (ref 80.0–100.0)
Monocytes Absolute: 1 10*3/uL (ref 0.1–1.0)
Monocytes Relative: 7 %
Neutro Abs: 12.1 10*3/uL — ABNORMAL HIGH (ref 1.7–7.7)
Neutrophils Relative %: 83 %
Platelet Count: 279 10*3/uL (ref 150–400)
RBC: 3.42 MIL/uL — ABNORMAL LOW (ref 3.87–5.11)
RDW: 14.6 % (ref 11.5–15.5)
WBC Count: 14.7 10*3/uL — ABNORMAL HIGH (ref 4.0–10.5)
nRBC: 0 % (ref 0.0–0.2)

## 2020-12-15 LAB — TSH: TSH: 0.69 u[IU]/mL (ref 0.308–3.960)

## 2020-12-15 MED ORDER — HEPARIN SOD (PORK) LOCK FLUSH 100 UNIT/ML IV SOLN
500.0000 [IU] | Freq: Once | INTRAVENOUS | Status: AC | PRN
  Administered 2020-12-15: 500 [IU]
  Filled 2020-12-15: qty 5

## 2020-12-15 MED ORDER — SODIUM CHLORIDE 0.9 % IV SOLN
Freq: Once | INTRAVENOUS | Status: AC
  Filled 2020-12-15: qty 250

## 2020-12-15 MED ORDER — XTAMPZA ER 13.5 MG PO C12A
1.0000 | EXTENDED_RELEASE_CAPSULE | Freq: Two times a day (BID) | ORAL | 0 refills | Status: DC
  Filled 2020-12-15: qty 60, 30d supply, fill #0

## 2020-12-15 MED ORDER — CYANOCOBALAMIN 1000 MCG/ML IJ SOLN
1000.0000 ug | Freq: Once | INTRAMUSCULAR | Status: AC
  Administered 2020-12-15: 1000 ug via INTRAMUSCULAR

## 2020-12-15 MED ORDER — PEMETREXED DISODIUM CHEMO INJECTION 500 MG
500.0000 mg/m2 | Freq: Once | INTRAVENOUS | Status: AC
  Administered 2020-12-15: 800 mg via INTRAVENOUS
  Filled 2020-12-15: qty 20

## 2020-12-15 MED ORDER — SODIUM CHLORIDE 0.9% FLUSH
10.0000 mL | INTRAVENOUS | Status: DC | PRN
  Administered 2020-12-15: 10 mL
  Filled 2020-12-15: qty 10

## 2020-12-15 MED ORDER — PROCHLORPERAZINE MALEATE 10 MG PO TABS
10.0000 mg | ORAL_TABLET | Freq: Once | ORAL | Status: AC
  Administered 2020-12-15: 10 mg via ORAL

## 2020-12-15 MED ORDER — PROCHLORPERAZINE MALEATE 10 MG PO TABS
ORAL_TABLET | ORAL | Status: AC
  Filled 2020-12-15: qty 1

## 2020-12-15 MED ORDER — CYANOCOBALAMIN 1000 MCG/ML IJ SOLN
INTRAMUSCULAR | Status: AC
  Filled 2020-12-15: qty 1

## 2020-12-15 MED ORDER — OXYCODONE HCL 5 MG PO TABS
ORAL_TABLET | ORAL | 0 refills | Status: DC
  Filled 2020-12-15: qty 90, 8d supply, fill #0

## 2020-12-15 MED ORDER — SODIUM CHLORIDE 0.9 % IV SOLN
200.0000 mg | Freq: Once | INTRAVENOUS | Status: AC
  Administered 2020-12-15: 200 mg via INTRAVENOUS
  Filled 2020-12-15: qty 8

## 2020-12-15 MED ORDER — SODIUM CHLORIDE 0.9% FLUSH
10.0000 mL | Freq: Once | INTRAVENOUS | Status: AC
  Administered 2020-12-15: 10 mL
  Filled 2020-12-15: qty 10

## 2020-12-15 MED FILL — Pentoxifylline Tab ER 400 MG: ORAL | 30 days supply | Qty: 60 | Fill #0 | Status: AC

## 2020-12-15 MED FILL — Folic Acid Tab 1 MG: ORAL | 90 days supply | Qty: 90 | Fill #0 | Status: AC

## 2020-12-15 NOTE — Patient Instructions (Signed)
[facility] Discharge Instructions for Patients Receiving Chemotherapy  Today you received the following chemotherapy agents keytruda, alimta  To help prevent nausea and vomiting after your treatment, we encourage you to take your nausea medication as directed.   If you develop nausea and vomiting that is not controlled by your nausea medication, call the clinic.   BELOW ARE SYMPTOMS THAT SHOULD BE REPORTED IMMEDIATELY:  *FEVER GREATER THAN 100.5 F  *CHILLS WITH OR WITHOUT FEVER  NAUSEA AND VOMITING THAT IS NOT CONTROLLED WITH YOUR NAUSEA MEDICATION  *UNUSUAL SHORTNESS OF BREATH  *UNUSUAL BRUISING OR BLEEDING  TENDERNESS IN MOUTH AND THROAT WITH OR WITHOUT PRESENCE OF ULCERS  *URINARY PROBLEMS  *BOWEL PROBLEMS  UNUSUAL RASH Items with * indicate a potential emergency and should be followed up as soon as possible.  Feel free to call the clinic should you have any questions or concerns. The clinic phone number is [phone].  Please show the [facility] at check-in to the Emergency Department and triage nurse.

## 2020-12-15 NOTE — Progress Notes (Signed)
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 CT of the head showed showed concerning for metastatic disease involving the left cerebellum and right occipital lobe.  2) 09/30/2019: MRI brain confirms mass within the left cerebellum measures 1.6 x 1.3 x 1.5 cm as well as at least 8 metastatic lesions within the supratentorial and infratentorial brain as outlined 3) 10/01/2019: PET CT scan revealed hypermetabolic infrahilar right lower lobe mass with hypermetabolic nodal metastases in the right hilum, mediastinum, right supraclavicular region, left axilla and lower left neck. 4) 10/08/2019: US guided biopsy of the lymph nodes confirms poorly differentiated non-small cell carcinoma. Immunohistochemistry confirms adenocarcinoma of lung primary. PD-L1 and NGS later revealed a MET Exon 14 mutation and TPS of 90%.  5) 10/12/2019: Establish care with Dr. Lorenso Courier  6) 11/04/2019: Day 1 of capmatinib $RemoveBefor'400mg'tLfxldFAMMhQ$  BID 7) 11/09/2019: presented to Rad/Onc with a DVT in LLE. Seen in symptom management clinic and started on apixaban.  8) 12/29/2019: CT C/A/P showed interval decrease in size of mass involving the superior segment of right lower lobe and reduction in mediastinal and left supraclavicular adenopathy though some small spinal lesions were noted in the interim between the two sets of scans 9) 01/25/2020: temporarily stopped capmatinib due to symptoms of nausea/fatigue. Restarted therapy on 01/30/2020 after brief chemo holiday.  10) 03/22/2020: CT C/A/P reveals a mixed picture, with new lung nodule and  increased lymphadenopathy, however response in bone lesions. D/c capmatinib therapy 11) 04/08/2020: start of Carbo/Pem/Pem for 2nd line treatment. Cycle 1 Day 1   12) 04/29/2020: Cycle 2 Day 1 Carbo/Pem/Pem  13) 05/20/2020: Cycle 3 Day 1 Carbo/Pem/Pem  14) 06/09/2020: Cycle 4 Day 1 Carbo/Pem/Pem  15) 06/29/2020: Cycle 5 Day 1 maintenance Pem/Pem  16) 07/20/2020: Cycle 6 Day 1 maintenance Pem/Pem  17) 08/12/2020: Cycle 7 Day 1 maintenance Pem/Pem  18) 09/01/2020: Cycle 8 Day 1 maintenance Pem/Pem  19) 09/21/2020: Cycle 9 Day 1 maintenance Pem/Pem  20) 10/13/2020: Cycle 10 Day 1 maintenance Pem/Pem  21) 11/03/2020: Cycle 11 Day 1 maintenance Pem/Pem  22) 11/25/2020:  Cycle 12 Day 1 maintenance Pem/Pem 23) 12/15/2020:  Cycle 13 Day 1 maintenance Pem/Pem  Interval History:  Virginia [name] 65 y.o. female with medical history significant for metastatic adenocarcinoma of the lung presents for a follow up visit. The patient's last visit was on 11/25/2020 prior to the start of Cycle 12 Day 1 of Pem/Pem. In the interim since the last visit she has had no new symptoms. Today is Cycle 13 Day 1.   On exam today Virginia [name] is accompanied by her husband.  She reports that she has been well in the interim since her last visit.  She has had no major side effects as result of her chemotherapy treatment.  She reports that she did have a little bit of back discomfort this morning and had to use a heating pad on it, but otherwise her pain has continued to be well controlled with the Xtampza ER twice daily and oxycodones as needed 2-3 times per day.  He notes that she tolerated her neurosurgical procedure well and is looking forward to radiation  treatment moving forward.  She is happy with the results of her CT scan which showed no clear signs of progression in the chest abdomen or pelvis.  Otherwise she has had no issues with nausea, vomiting, or diarrhea. A full 10 point ROS is listed below.   MEDICAL HISTORY:  Past  Medical History:  Diagnosis Date  . Anemia   . Anxiety   . Concussion 09/28/2019  . DVT (deep venous thrombosis) ([place]) 2021   L leg  . Dyspnea   . GERD (gastroesophageal reflux disease)   . Hypercholesterolemia    per pt, she does not have elevated lipids  . Hypertension   . met lung ca dx'd 09/2019   mets to spine, hip and brain  . PONV (postoperative nausea and vomiting)   . Tobacco abuse     SURGICAL HISTORY: Past Surgical History:  Procedure Laterality Date  . ABDOMINAL HYSTERECTOMY     partial/ left ovaries  . APPLICATION OF CRANIAL NAVIGATION N/A 12/02/2020   Procedure: APPLICATION OF CRANIAL NAVIGATION;  Surgeon: Erline Levine, MD;  Location: [place];  Service: Neurosurgery;  Laterality: N/A;  . CHOLECYSTECTOMY    . CRANIOTOMY N/A 12/02/2020   Procedure: Posterior fossa craniotomy for tumor resection with brainlab;  Surgeon: Erline Levine, MD;  Location: [place];  Service: Neurosurgery;  Laterality: N/A;  . DILATION AND CURETTAGE OF UTERUS    . IR IMAGING GUIDED PORT INSERTION  10/23/2019  . KYPHOPLASTY N/A 03/15/2020   Procedure: Thoracic Eight KYPHOPLASTY;  Surgeon: Erline Levine, MD;  Location: [facility];  Service: Neurosurgery;  Laterality: N/A;  prone   . LIPOMA EXCISION  2018   removed under left breast and right thigh.  . TUBAL LIGATION      ALLERGIES:  has No Known Allergies.  MEDICATIONS:  Current Outpatient Medications  Medication Sig Dispense Refill  . acetaminophen (TYLENOL) 500 MG tablet Take 1,000 mg by mouth every 8 (eight) hours as needed for moderate pain.    Marland Kitchen albuterol (VENTOLIN HFA) 108 (90 Base) MCG/ACT inhaler INHALE 2 PUFFS INTO THE LUNGS EVERY 6 HOURS AS NEEDED FOR WHEEZING OR SHORTNESS OF BREATH (Patient taking differently: Inhale 2 puffs into the lungs every 6 (six) hours as needed for shortness of breath or wheezing.) 6.7 g 11  . ALPRAZolam (XANAX) 0.25 MG tablet Take 1 tablet (0.25 mg total) by mouth 2 (two) times daily as needed. for anxiety (Patient  taking differently: Take 0.25 mg by mouth 2 (two) times daily as needed for anxiety.) 60 tablet 5  . amLODipine (NORVASC) 5 MG tablet TAKE 1 TABLET BY MOUTH ONCE A DAY 30 tablet 3  . Cyanocobalamin (VITAMIN B12 PO) Take 2 tablets by mouth daily. Gummies    . dexamethasone (DECADRON) 2 MG tablet TAKE 1 TABLET BY MOUTH ONCE A DAY 30 tablet 1  . folic acid (FOLVITE) 1 MG tablet TAKE 1 TABLET BY MOUTH DAILY. (Patient taking differently: Take 1 mg by mouth daily.) 90 tablet 1  . HYDROcodone-acetaminophen (NORCO/VICODIN) 5-325 MG tablet Take 1 tablet by mouth every 4 (four) hours as needed for moderate pain. 30 tablet 0  . lidocaine-prilocaine (EMLA) cream Apply 1 application topically as needed. Apply to port as needed. 30 g 0  . Multiple Vitamins-Minerals (AIRBORNE PO) Take 1 tablet by mouth daily.     Marland Kitchen ofloxacin (OCUFLOX) 0.3 % ophthalmic solution 2 drops each eye 4 times a day for 5 days (Patient taking differently: Place 2 drops into both eyes daily as needed (  irritation).) 5 mL 1  . OLANZapine (ZYPREXA) 2.5 MG tablet TAKE 1 TABLET BY MOUTH EVERY NIGHT AT BEDTIME 90 tablet 1  . ondansetron (ZOFRAN) 4 MG tablet TAKE 1 TO 2 TABLETS BY MOUTH 2 TIMES DAILY AS NEEDED 60 tablet 0  . oxyCODONE (OXY IR/ROXICODONE) 5 MG immediate release tablet TAKE 1-2 TABLETS BY MOUTH EVERY 4 HOURS AS NEEDED FOR SEVERE PAIN 90 tablet 0  . pantoprazole (PROTONIX) 40 MG tablet TAKE ONE TABLET BY MOUTH DAILY (Patient taking differently: Take 40 mg by mouth daily.) 90 tablet 3  . pentoxifylline (TRENTAL) 400 MG CR tablet TAKE 1 TABLET BY MOUTH FOR 1 WEEK THEN 1 TABLET BY MOUTH 2 TIMES DAILY THEREAFTER WITH FOOD (Patient taking differently: Take 400 mg by mouth in the morning and at bedtime. Take $RemoveBefor'400mg'NpWHqmHLMDwi$  tab daily x 1 week, then $RemoveBe'400mg'lQgsqhNtM$  BID; take with food) 60 tablet 2  . polyethylene glycol (MIRALAX / GLYCOLAX) 17 g packet Take 17 g by mouth 2 (two) times daily.     . promethazine (PHENERGAN) 12.5 MG tablet TAKE 1 TO 2 TABLETS BY  MOUTH EVERY 4 HOURS AS NEEDED FOR REFRACTORY NAUSEA OR VOMITING 30 tablet 0  . Vitamin D3 (VITAMIN D) 25 MCG tablet Take 2,000 Units by mouth daily.    . vitamin E 180 MG (400 UNITS) capsule Take 400 IU daily x 1 week, then 400 IU BID (Patient taking differently: Take 400 Units by mouth in the morning and at bedtime. Take 400 IU daily x 1 week, then 400 IU BID) 60 capsule 4  . XTAMPZA ER 13.5 MG C12A Take 13.5 mg by mouth in the morning and at bedtime. Take 1 pill by mouth every 12 hours 60 capsule 0   No current facility-administered medications for this visit.   Facility-Administered Medications Ordered in Other Visits  Medication Dose Route Frequency Provider Last Rate Last Admin  . heparin lock flush 100 unit/mL  500 Units Intracatheter Once PRN Orson Slick, MD      . pembrolizumab [facility]) 200 mg in sodium chloride 0.9 % 50 mL chemo infusion  200 mg Intravenous Once Ledell Peoples IV, MD      . PEMEtrexed (ALIMTA) 800 mg in sodium chloride 0.9 % 100 mL chemo infusion  500 mg/m2 (Order-Specific) Intravenous Once Narda Rutherford T IV, MD      . sodium chloride flush (NS) 0.9 % injection 10 mL  10 mL Intracatheter PRN Orson Slick, MD        REVIEW OF SYSTEMS:   Constitutional: ( - ) fevers, ( - )  chills , ( - ) night sweats Eyes: ( - ) blurriness of vision, ( - ) double vision, ( - ) watery eyes Ears, nose, mouth, throat, and face: ( - ) mucositis, ( - ) sore throat Respiratory: ( - ) cough, ( - ) dyspnea, ( - ) wheezes Cardiovascular: ( - ) palpitation, ( - ) chest discomfort, ( - ) lower extremity swelling Gastrointestinal:  ( - ) nausea, ( - ) heartburn, ( - ) change in bowel habits Skin: ( - ) abnormal skin rashes Lymphatics: ( - ) new lymphadenopathy, ( - ) easy bruising Neurological: ( - ) numbness, ( - ) tingling, ( - ) new weaknesses Behavioral/Psych: ( - ) mood change, ( - ) new changes  All other systems were reviewed with the patient and are negative.  PHYSICAL  EXAMINATION: ECOG PERFORMANCE STATUS: 1 - Symptomatic but completely ambulatory  Vitals:   12/15/20 1148  BP: (!) 151/94  Pulse: 98  Resp: 17  Temp: 97.9 F (36.6 C)  SpO2: 99%   Filed Weights   12/15/20 1148  Weight: 144 lb (65.3 kg)    GENERAL: well appearing middle aged Caucasian female in NAD  SKIN: skin color, texture, turgor are normal, no rashes or significant lesions EYES: conjunctiva are pink and non-injected, sclera clear LUNGS: clear to auscultation and percussion with normal breathing effort HEART: regular rate & rhythm and no murmurs and bilateral + trace pitting lower extremity edema Musculoskeletal: no cyanosis of digits and no clubbing PSYCH: alert & oriented x 3, fluent speech NEURO: no focal motor/sensory deficits   LABORATORY DATA:  I have reviewed the data as listed CBC Latest Ref Rng & Units 12/15/2020 11/30/2020 11/24/2020  WBC 4.0 - 10.5 K/uL 14.7(H) 7.5 10.2  Hemoglobin 12.0 - 15.0 g/dL 11.6(L) 12.6 11.7(L)  Hematocrit 36.0 - 46.0 % 33.3(L) 37.8 34.5(L)  Platelets 150 - 400 K/uL 279 240 342    CMP Latest Ref Rng & Units 12/15/2020 11/30/2020 11/24/2020  Glucose 70 - 99 mg/dL 111(H) 150(H) 127(H)  BUN 8 - 23 mg/dL $Remove'15 11 17  'OmUWmJo$ Creatinine 0.44 - 1.00 mg/dL 0.75 0.62 0.78  Sodium 135 - 145 mmol/L 140 136 138  Potassium 3.5 - 5.1 mmol/L 4.1 4.0 4.2  Chloride 98 - 111 mmol/L 105 103 105  CO2 22 - 32 mmol/L $RemoveB'23 27 25  'KWdTrcBd$ Calcium 8.9 - 10.3 mg/dL 8.3(L) 8.9 8.7(L)  Total Protein 6.5 - 8.1 g/dL 6.3(L) - 6.6  Total Bilirubin 0.3 - 1.2 mg/dL 0.2(L) - 0.3  Alkaline Phos 38 - 126 U/L 46 - 59  AST 15 - 41 U/L 26 - 20  ALT 0 - 44 U/L 54(H) - 39     RADIOGRAPHIC STUDIES: I personally have viewed the radiographic studies below: Table disease from prior, no new lesions or new metastatic disease. CT HEAD WO CONTRAST  Result Date: 12/01/2020 CLINICAL DATA:  Brain tumor. Additional history provided: Brain lab protocol, surgery scheduled for tomorrow at 5:30. History of  lung cancer. EXAM: CT HEAD WITHOUT CONTRAST TECHNIQUE: Contiguous axial images were obtained from the base of the skull through the vertex without intravenous contrast. COMPARISON:  Brain MRI 11/04/2020. FINDINGS: Brain: Cerebral volume is normal. There are multiple known supratentorial and infratentorial metastatic lesions, better appreciated on the prior brain MRI of 11/04/2020. Most notably, a dominant lesion is present within the left cerebellar hemisphere with surrounding edema. Redemonstrated patchy and ill-defined hypoattenuation within the cerebral white matter, nonspecific but likely reflecting a combination of chronic small vessel ischemic disease and post treatment changes. No evidence of acute intracranial hemorrhage. No demarcated cortical infarct. No extra-axial fluid collection. No midline shift. Vascular: No hyperdense vessel.  Atherosclerotic calcifications. Skull: Normal. Negative for fracture or focal lesion. Sinuses/Orbits: Partial opacification of a posterior left ethmoid air cell. There is otherwise mild scattered bilateral ethmoid sinus mucosal thickening. Mild mucosal thickening and small mucous retention cysts within the bilateral maxillary sinuses. IMPRESSION: Multiple known supratentorial and infratentorial metastatic lesions, better appreciated on the prior brain MRI of 11/04/2020. Most notably, a dominant lesion is present within the left cerebellar hemisphere with surrounding edema. Redemonstrated patchy and ill-defined hypoattenuation within the cerebral white matter, nonspecific but likely reflecting a combination of chronic small vessel ischemic disease and post treatment changes. Paranasal sinus disease as described. Electronically Signed   By: Kellie Simmering DO   On: 12/01/2020 18:25   MR  BRAIN W WO CONTRAST  Result Date: 12/01/2020 CLINICAL DATA:  Brain tumor. Preoperative planning for metastatic lung cancer. EXAM: MRI HEAD WITHOUT AND WITH CONTRAST TECHNIQUE: Multiplanar,  multiecho pulse sequences of the brain and surrounding structures were obtained without and with intravenous contrast. CONTRAST:  70mL GADAVIST GADOBUTROL 1 MMOL/ML IV SOLN COMPARISON:  Head CT performed earlier the same day 12/01/2020. Prior brain MRI examinations 11/04/2020 and earlier. FINDINGS: Brain: Mild intermittent motion degradation. Cerebral volume is normal for age. Redemonstration of multiple enhancing supratentorial and infratentorial metastases. As before, the dominant metastasis within the medial left cerebellar hemisphere measures 2.4 x 1.9 cm in transaxial dimensions and demonstrates central necrosis as well as somewhat prominent surrounding edema. Numerous additional small metastases within the bilateral cerebellar hemispheres and vermis, measuring up to approximately 5 mm have not significantly changed. Redemonstrated subcentimeter enhancing metastases within the right parietal lobe (series 14, image 154), anterior left frontal lobe (series 14, image 173), right occipital lobe (series 14, image 114) and posterior left frontal lobe (series 103, image 23). No new intracranial metastasis is identified. Background multifocal T2/FLAIR hyperintensity within the cerebral white matter, nonspecific but likely reflecting a combination of chronic small vessel ischemic disease and treatment related changes. There is no acute infarct. No chronic intracranial blood products. No extra-axial fluid collection. No midline shift. Vascular: Expected proximal arterial flow voids. Skull and upper cervical spine: No focal suspicious marrow lesion. Sinuses/Orbits: Visualized orbits show no acute finding. Partial opacification of the posterior left ethmoid air cell. Background mild bilateral ethmoid sinus mucosal thickening. Mild mucosal thickening within the bilateral maxillary sinuses. Superimposed small left maxillary sinus mucous retention cyst. IMPRESSION: Stable examination as compared to the MRI of 11/04/2020.  Dominant metastasis within the medial left cerebellum measuring 2.4 x 1.9 cm with somewhat prominent surrounding edema. Numerous additional small enhancing metastases within the bilateral cerebellar hemispheres and vermis measuring up to 5 mm. Subcentimeter enhancing metastases within the right parietal lobe, right occipital lobe, anterior left frontal lobe and posterior left frontal lobe. No new intracranial metastasis is identified. Electronically Signed   By: Jackey Loge DO   On: 12/01/2020 20:50   CT CHEST ABDOMEN PELVIS W CONTRAST  Result Date: 12/14/2020 CLINICAL DATA:  Follow-up metastatic non-small cell lung cancer, ongoing chemotherapy EXAM: CT CHEST, ABDOMEN, AND PELVIS WITH CONTRAST TECHNIQUE: Multidetector CT imaging of the chest, abdomen and pelvis was performed following the standard protocol during bolus administration of intravenous contrast. CONTRAST:  OMNIPAQUE IOHEXOL 300 MG/ML  SOLN COMPARISON:  Multiple priors including most recent CT chest abdomen and pelvis September 16, 2020 FINDINGS: CT CHEST FINDINGS Cardiovascular: Right chest porta catheter with tip in the SVC. Atherosclerotic calcifications of the aorta and branch vessels. No thoracic aortic aneurysm. No central pulmonary embolus. Normal size heart. Calcifications mitral annulus. Stable mild diffuse pericardial thickening. Mediastinum/Nodes: No discrete thyroid nodularity. No mediastinal, hilar or axillary adenopathy. The trachea and esophagus are grossly unremarkable. Lungs/Pleura: Similar post radiation changes are again noted in the medial right lower lobe. Multiple bilateral ground-glass nodules are again visualized inter not significantly changed in the interim. No new or enlarging suspicious pulmonary nodules. No focal consolidation. No pleural effusion. No pneumothorax. Musculoskeletal: T8 vertebroplasty again noted. Again seen are a few small sclerotic osseous lesions in the mid and lower thoracic vertebra which appear  unchanged. No new suspicious lytic or blastic lesion of bone. CT ABDOMEN PELVIS FINDINGS Hepatobiliary: No suspicious hepatic lesions. Gallbladder is surgically absent. Mild prominence of the biliary tree similar  to prior, likely reservoir effect post cholecystectomy. Pancreas: Unremarkable. No pancreatic ductal dilatation or surrounding inflammatory changes. Spleen: Normal in size without focal abnormality. Adrenals/Urinary Tract: Adrenal glands are unremarkable. Kidneys are normal, without renal calculi, focal lesion, or hydronephrosis. Gas in the urinary bladder, recommend correlation with recent instrumentation, otherwise the urinary bladder is unremarkable. Stomach/Bowel: Stomach is grossly unremarkable for degree of distension. No suspicious small bowel wall thickening or dilation. The appendix and terminal ileum appear unremarkable. No suspicious colonic wall thickening or mass like lesions. Scattered left-sided colonic diverticulosis without findings of acute diverticulitis. Vascular/Lymphatic: Aortic atherosclerosis. No gastrohepatic or hepatoduodenal ligament lymphadenopathy. No retroperitoneal or mesenteric lymphadenopathy. No pelvic sidewall lymphadenopathy. No groin lymphadenopathy. Reproductive: Status post hysterectomy. No adnexal masses. Other: No abdominopelvic ascites. No discrete mesenteric or omental nodularity. Musculoskeletal: No significant change in the sclerotic osseous lesions of the pelvis and lumbar spine. No new suspicious osseous lesions. IMPRESSION: 1. Similar post radiation changes in the medial right lower lobe. 2. No evidence of soft tissue metastatic disease in the chest abdomen or pelvis. 3. No significant change in the sclerotic osseous lesions of the pelvis and lumbar spine. No new suspicious osseous lesions. 4. Multiple bilateral ground-glass nodules are not significantly changed, recommend continued attention on follow-up imaging. No new or enlarging suspicious pulmonary  nodules visualized. 5. Gas in the urinary bladder, recommend correlation with recent instrumentation. 6. Aortic atherosclerosis. Aortic Atherosclerosis (ICD10-I70.0). Electronically Signed   By: Dahlia Bailiff MD   On: 12/14/2020 14:53    ASSESSMENT & PLAN Virginia [name] 65 y.o. female with medical history significant for metastatic adenocarcinoma of the lung presents for a follow up visit. 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   Today Virginia [name] reports she is currently stable at her baseline.  Her pain continues to be under good control and she is not having any new symptoms at this time. Overall she is willing and able to proceed with chemotherapy had no reservations about moving forward.  She is also not demonstrating any symptoms of recurrent VTE or signs or symptoms concerning for worsening brain bleed.  Previously we discussed Carboplatin/Pembrolizumab/Pemetrexed and the expected side effect from these medications.  We discussed the possible side effects of immunotherapy including colitis, hepatitis, dermatitis, and pneumonitis.  Additionally we discussed the side effects of carboplatin which include suppression of blood counts, nephrotoxicity, and nausea.  Additionally we discussed pemetrexed which can also have effects on counts and would require supplementation with vitamin X83 and folic acid.  She voiced her understanding of these side effects and treatment plans moving forward.  I also noted that we would be able to drop the chemotherapy agents that she had intolerance to continue with pembrolizumab alone given her high TPS score.  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 --port in place, patient  completed chemotherapy education.  --plan for repeat CT C/A/P in July 2022 (3 months from last scan in April 2022) --most recent MRI brain showed concern for progression on brain metastasis. Underwent neurosurgical intervention with upcoming SRS. --supportive therapy as listed below.  --RTC q 3 weeks for continued monitoring/chemotherapy treatments.    #Nausea/Vomiting -- provided patient with Zofran 4-$RemoveBefor'8mg'nhavnnVgJLTK$  q8h  PRN and compazine 10mg  q6H for breakthrough PRN. These have not been required since olanzapine was added --continue olanzpaine 2.5mg  PO QHS to help with nausea. Marked improvement since the addition of this medication on 01/29/2020.   --continue to monitor   #Hypokalemia, stable --likely 2/2 to decreased PO intake, hypovolemia, and BP medications --given her decreased BP would recommend holding losartan and hydrochlorothiazide --continue to monitor  #Pain Control, stable --continue oxycodone 5mg  q4H PRN. Can increase dose to 10mg  q6H if pain is severe. She is taking approximately 2-3 pills per day.  --continue Xtampza ER BID for long acting support.  --continue to take with Senokot to prevent opioid induced constipation. 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  --nausea as above   #Brain Metastasis, progressing --appreciate the assistance of Dr. 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 --radiation therapy to the brain completed on 11/16/2019.  --MRI brain on 04/29/2020 showed evidence of small brain bleed. Eliquis therapy was discontinued.  --MRI on 11/04/2020 showed concern for progression of intracranial disease. Neurosurgery performed resection with upcoming SRS.  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     No orders of the defined types were placed in this encounter.  All questions were answered. The patient knows to call the clinic with any problems, questions or concerns.  A total of more than 30 minutes were spent on this encounter and over half of that time was spent on counseling and coordination of care as outlined above.   Ledell Peoples, MD Department of Hematology/Oncology [place] at [facility] Phone: [phone] Pager: [phone] Email: [email or web address]  12/15/2020 2:15 PM   Literature Support:  Lorenza Chick, Rodrguez-Abreu D, Duffy Rhody, Felip E, De Angelis F, Domine M, [place], Hochmair MJ, [place], [place], Bischoff HG, [place], Grossi F, [place], Reck M, [place], [place], [place], Rubio-Viqueira B, Novello S, Kurata T, Gray JE, Vida J, Wei Z, Yang J, Raftopoulos H, [place], [place] [employer]; [id number] Investigators. Pembrolizumab plus Chemotherapy in Metastatic Non-Small-Cell Lung Cancer. [facility] Med. 2018 May 31;378(22):2078-2092.  --Median progression-free survival was 8.8 months (95% CI, 7.6 to 9.2) in the pembrolizumab-combination group and 4.9 months (95% CI, 4.7 to 5.5) in the placebo-combination group (hazard ratio for disease progression or death, 0.52; 95% CI, 0.43 to 0.64; P<0.001). Adverse events of grade 3 or higher occurred in 67.2% of the patients in the pembrolizumab-combination group and in 65.8% of those in the placebo-combination group.  Jodene Nam L. Efficacy of pemetrexed-based regimens in advanced non-small cell lung cancer patients with activating epidermal growth factor receptor mutations after tyrosine kinase inhibitor failure: a systematic review. Onco Targets Ther. 2018;11:2121-2129.   --The weighted median PFS, median OS, and ORR for patients treated with pem regimens were 5.09 months, 15.91 months, and 30.19%, respectively. Our systematic review results showed a favorable  efficacy profile of pem regimens in NSCLC patients with EGFR mutation after EGFR-TKI failure.

## 2020-12-22 ENCOUNTER — Other Ambulatory Visit: Payer: Self-pay

## 2020-12-22 ENCOUNTER — Ambulatory Visit
Admission: RE | Admit: 2020-12-22 | Discharge: 2020-12-22 | Disposition: A | Discharge status: Home / Self Care | Payer: BC Managed Care – PPO | Source: Ambulatory Visit | Attending: Radiation Oncology | Admitting: Radiation Oncology

## 2020-12-22 DIAGNOSIS — G9389 Other specified disorders of brain: Secondary | ICD-10-CM | POA: Diagnosis not present

## 2020-12-22 DIAGNOSIS — C7931 Secondary malignant neoplasm of brain: Secondary | ICD-10-CM

## 2020-12-22 DIAGNOSIS — G936 Cerebral edema: Secondary | ICD-10-CM | POA: Diagnosis not present

## 2020-12-22 DIAGNOSIS — Z85118 Personal history of other malignant neoplasm of bronchus and lung: Secondary | ICD-10-CM | POA: Diagnosis not present

## 2020-12-22 MED ORDER — SODIUM CHLORIDE 0.9% FLUSH: 10.0000 mL | INTRAVENOUS | Status: DC | PRN

## 2020-12-22 MED ORDER — GADOBENATE DIMEGLUMINE 529 MG/ML IV SOLN
14.0000 mL | Freq: Once | INTRAVENOUS | Status: AC | PRN
  Administered 2020-12-22: 14 mL via INTRAVENOUS

## 2020-12-22 MED ORDER — HEPARIN SOD (PORK) LOCK FLUSH 100 UNIT/ML IV SOLN
500.0000 [IU] | Freq: Once | INTRAVENOUS | Status: DC
Start: 2020-12-22 — End: 2020-12-23

## 2020-12-22 NOTE — Progress Notes (Signed)
Pt arrived for MRI at [facility]. Pt stated that she did not want her port accessed. Therefor, a peripheral IV will be inserted for her to receive contrast.

## 2020-12-26 ENCOUNTER — Inpatient Hospital Stay: Payer: BC Managed Care – PPO

## 2020-12-26 ENCOUNTER — Ambulatory Visit: Payer: BC Managed Care – PPO

## 2020-12-26 ENCOUNTER — Ambulatory Visit: Payer: BC Managed Care – PPO | Admitting: Radiation Oncology

## 2020-12-29 ENCOUNTER — Other Ambulatory Visit: Payer: Self-pay | Admitting: Hematology and Oncology

## 2020-12-29 ENCOUNTER — Other Ambulatory Visit (HOSPITAL_COMMUNITY): Payer: Self-pay

## 2020-12-29 MED ORDER — AMLODIPINE BESYLATE 5 MG PO TABS
ORAL_TABLET | Freq: Every day | ORAL | 3 refills | Status: DC
  Filled 2020-12-29: qty 30, 30d supply, fill #0
  Filled 2021-01-26 – 2021-01-27 (×2): qty 30, 30d supply, fill #1

## 2020-12-30 ENCOUNTER — Ambulatory Visit: Payer: BC Managed Care – PPO | Admitting: Radiation Oncology

## 2020-12-31 ENCOUNTER — Other Ambulatory Visit: Payer: Self-pay | Admitting: Radiation Oncology

## 2020-12-31 DIAGNOSIS — C7931 Secondary malignant neoplasm of brain: Secondary | ICD-10-CM

## 2021-01-02 ENCOUNTER — Ambulatory Visit: Payer: BC Managed Care – PPO | Admitting: Radiation Oncology

## 2021-01-02 ENCOUNTER — Other Ambulatory Visit (HOSPITAL_COMMUNITY): Payer: Self-pay

## 2021-01-02 ENCOUNTER — Inpatient Hospital Stay (HOSPITAL_BASED_OUTPATIENT_CLINIC_OR_DEPARTMENT_OTHER): Payer: BC Managed Care – PPO | Admitting: Internal Medicine

## 2021-01-02 ENCOUNTER — Other Ambulatory Visit: Payer: Self-pay | Admitting: Radiation Oncology

## 2021-01-02 ENCOUNTER — Other Ambulatory Visit: Payer: Self-pay

## 2021-01-02 VITALS — BP 133/93 | HR 96 | Temp 97.7°F | Resp 18 | Wt 150.3 lb

## 2021-01-02 DIAGNOSIS — E876 Hypokalemia: Secondary | ICD-10-CM | POA: Diagnosis not present

## 2021-01-02 DIAGNOSIS — I700 Atherosclerosis of aorta: Secondary | ICD-10-CM | POA: Diagnosis not present

## 2021-01-02 DIAGNOSIS — Z923 Personal history of irradiation: Secondary | ICD-10-CM | POA: Diagnosis not present

## 2021-01-02 DIAGNOSIS — I6789 Other cerebrovascular disease: Secondary | ICD-10-CM | POA: Insufficient documentation

## 2021-01-02 DIAGNOSIS — C3431 Malignant neoplasm of lower lobe, right bronchus or lung: Secondary | ICD-10-CM | POA: Diagnosis not present

## 2021-01-02 DIAGNOSIS — C7931 Secondary malignant neoplasm of brain: Secondary | ICD-10-CM | POA: Diagnosis not present

## 2021-01-02 DIAGNOSIS — Z79899 Other long term (current) drug therapy: Secondary | ICD-10-CM | POA: Diagnosis not present

## 2021-01-02 DIAGNOSIS — K5730 Diverticulosis of large intestine without perforation or abscess without bleeding: Secondary | ICD-10-CM | POA: Diagnosis not present

## 2021-01-02 DIAGNOSIS — I10 Essential (primary) hypertension: Secondary | ICD-10-CM | POA: Diagnosis not present

## 2021-01-02 DIAGNOSIS — Z8249 Family history of ischemic heart disease and other diseases of the circulatory system: Secondary | ICD-10-CM | POA: Diagnosis not present

## 2021-01-02 DIAGNOSIS — C7951 Secondary malignant neoplasm of bone: Secondary | ICD-10-CM | POA: Diagnosis not present

## 2021-01-02 DIAGNOSIS — Z87891 Personal history of nicotine dependence: Secondary | ICD-10-CM | POA: Diagnosis not present

## 2021-01-02 DIAGNOSIS — Y842 Radiological procedure and radiotherapy as the cause of abnormal reaction of the patient, or of later complication, without mention of misadventure at the time of the procedure: Secondary | ICD-10-CM | POA: Insufficient documentation

## 2021-01-02 DIAGNOSIS — Z9049 Acquired absence of other specified parts of digestive tract: Secondary | ICD-10-CM | POA: Diagnosis not present

## 2021-01-02 DIAGNOSIS — R52 Pain, unspecified: Secondary | ICD-10-CM | POA: Diagnosis not present

## 2021-01-02 DIAGNOSIS — Z5111 Encounter for antineoplastic chemotherapy: Secondary | ICD-10-CM | POA: Diagnosis not present

## 2021-01-02 DIAGNOSIS — G939 Disorder of brain, unspecified: Secondary | ICD-10-CM | POA: Diagnosis not present

## 2021-01-02 DIAGNOSIS — Z5112 Encounter for antineoplastic immunotherapy: Secondary | ICD-10-CM | POA: Diagnosis not present

## 2021-01-02 MED ORDER — DEXAMETHASONE 1 MG PO TABS
1.0000 mg | ORAL_TABLET | Freq: Every day | ORAL | 1 refills | Status: DC
  Filled 2021-01-02: qty 30, 30d supply, fill #0

## 2021-01-02 MED ORDER — PENTOXIFYLLINE ER 400 MG PO TBCR
EXTENDED_RELEASE_TABLET | ORAL | 2 refills | Status: DC
  Filled 2021-01-02: qty 60, fill #0
  Filled 2021-01-06 (×2): qty 60, 30d supply, fill #0
  Filled 2021-02-13: qty 60, 30d supply, fill #1
  Filled 2021-03-10: qty 60, 30d supply, fill #2

## 2021-01-02 NOTE — Progress Notes (Signed)
[facility] [place], [place] [address] [phone]   Interval Evaluation  Date of Service: 01/02/21 Patient Name: Virginia [name] Patient MRN: [id number] Patient DOB: 1956-02-04 Provider: Ventura Sellers, MD  Identifying Statement:  Virginia [name] is a 65 y.o. 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 Isidore [name] 12/02/20: Craniotomy, resection of progressive cerebellar mass by Dr. Vertell [name].  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  Interval History:  Virginia [name] presents today following surgery, subsequent brain MRI study.  Gait instability, hand clumsiness is stable over the past several weeks.  She remains independent and essentially fully functional.  She otherwise describes no new or progressive neurologic deficits.  No seizures or headaches.  Continues on decadron 34m daily, but now has swelling of the face, neck and abdomen.  She continues on chemotherapy well with Dr. DLorenso Courier  H+P (10/15/19) Patient presents to clinic to discuss recent neurologic symptoms and imaging findings.  She describes a fall while climbing stairs on 1/20; she hit the back of her and lost consciousness.  She is unclear whether she lost her balance or had mechanical provocation, but she had been drinking that evening.  She was treated initially as a trauma, CT demonstrated a lesion in the posterior fossa, later confirmed as a metastasis by MRI, along with 7 additional smaller lesions.  It took her almost two weeks to return for cognition and balance to return to "normal" which she feels she is about at right now.  Baseline she is high functioning and independent, works as a bNeurosurgeon  Has been taking decadron 238mtwice per day.  Plans to initiate chemotherapy soon with Dr. DoLorenso Courier Medications: Current Outpatient Medications on File Prior to  Visit  Medication Sig Dispense Refill  . acetaminophen (TYLENOL) 500 MG tablet Take 1,000 mg by mouth every 8 (eight) hours as needed for moderate pain.    . Marland Kitchenlbuterol (VENTOLIN HFA) 108 (90 Base) MCG/ACT inhaler INHALE 2 PUFFS INTO THE LUNGS EVERY 6 HOURS AS NEEDED FOR WHEEZING OR SHORTNESS OF BREATH (Patient taking differently: Inhale 2 puffs into the lungs every 6 (six) hours as needed for shortness of breath or wheezing.) 6.7 g 11  . ALPRAZolam (XANAX) 0.25 MG tablet Take 1 tablet (0.25 mg total) by mouth 2 (two) times daily as needed. for anxiety (Patient taking differently: Take 0.25 mg by mouth 2 (two) times daily as needed for anxiety.) 60 tablet 5  . amLODipine (NORVASC) 5 MG tablet TAKE 1 TABLET BY MOUTH ONCE A DAY 30 tablet 3  . Cyanocobalamin (VITAMIN B12 PO) Take 2 tablets by mouth daily. Gummies    . dexamethasone (DECADRON) 2 MG tablet TAKE 1 TABLET BY MOUTH ONCE A DAY 30 tablet 1  . folic acid (FOLVITE) 1 MG tablet TAKE 1 TABLET BY MOUTH DAILY. (Patient taking differently: Take 1 mg by mouth daily.) 90 tablet 1  . HYDROcodone-acetaminophen (NORCO/VICODIN) 5-325 MG tablet Take 1 tablet by mouth every 4 (four) hours as needed for moderate pain. 30 tablet 0  . lidocaine-prilocaine (EMLA) cream Apply 1 application topically as needed. Apply to port as needed. 30 g 0  . Multiple Vitamins-Minerals (AIRBORNE PO) Take 1 tablet by mouth daily.     . Marland Kitchenfloxacin (OCUFLOX) 0.3 % ophthalmic solution 2 drops each eye 4 times a day for 5 days (Patient taking differently:  Place 2 drops into both eyes daily as needed (irritation).) 5 mL 1  . OLANZapine (ZYPREXA) 2.5 MG tablet TAKE 1 TABLET BY MOUTH EVERY NIGHT AT BEDTIME 90 tablet 1  . ondansetron (ZOFRAN) 4 MG tablet TAKE 1 TO 2 TABLETS BY MOUTH 2 TIMES DAILY AS NEEDED 60 tablet 0  . oxyCODONE (OXY IR/ROXICODONE) 5 MG immediate release tablet TAKE 1-2 TABLETS BY MOUTH EVERY 4 HOURS AS NEEDED FOR SEVERE PAIN 90 tablet 0  . pantoprazole (PROTONIX) 40 MG  tablet TAKE ONE TABLET BY MOUTH DAILY (Patient taking differently: Take 40 mg by mouth daily.) 90 tablet 3  . pentoxifylline (TRENTAL) 400 MG CR tablet TAKE 1 TABLET BY MOUTH FOR 1 WEEK THEN 1 TABLET BY MOUTH 2 TIMES DAILY THEREAFTER WITH FOOD (Patient taking differently: Take 400 mg by mouth in the morning and at bedtime. Take 462m tab daily x 1 week, then 4023mBID; take with food) 60 tablet 2  . polyethylene glycol (MIRALAX / GLYCOLAX) 17 g packet Take 17 g by mouth 2 (two) times daily.     . promethazine (PHENERGAN) 12.5 MG tablet TAKE 1 TO 2 TABLETS BY MOUTH EVERY 4 HOURS AS NEEDED FOR REFRACTORY NAUSEA OR VOMITING 30 tablet 0  . Vitamin D3 (VITAMIN D) 25 MCG tablet Take 2,000 Units by mouth daily.    . vitamin E 180 MG (400 UNITS) capsule Take 400 IU daily x 1 week, then 400 IU BID (Patient taking differently: Take 400 Units by mouth in the morning and at bedtime. Take 400 IU daily x 1 week, then 400 IU BID) 60 capsule 4  . XTAMPZA ER 13.5 MG C12A Take 1 capsule by mouth in the morning and at bedtime. (every 12 hours) 60 capsule 0   No current facility-administered medications on file prior to visit.    Allergies: No Known Allergies Past Medical History:  Past Medical History:  Diagnosis Date  . Anemia   . Anxiety   . Concussion 09/28/2019  . DVT (deep venous thrombosis) ([id number]   L leg  . Dyspnea   . GERD (gastroesophageal reflux disease)   . Hypercholesterolemia    per pt, she does not have elevated lipids  . Hypertension   . met lung ca dx'd 09/2019   mets to spine, hip and brain  . PONV (postoperative nausea and vomiting)   . Tobacco abuse    Past Surgical History:  Past Surgical History:  Procedure Laterality Date  . ABDOMINAL HYSTERECTOMY     partial/ left ovaries  . APPLICATION OF CRANIAL NAVIGATION N/A 12/02/2020   Procedure: APPLICATION OF CRANIAL NAVIGATION;  Surgeon: StErline LevineMD;  [facility] Service[facility]surgery;  Laterality: N/A;  .  CHOLECYSTECTOMY    . CRANIOTOMY N/A 12/02/2020   Procedure: Posterior fossa craniotomy for tumor resection with brainlab;  Surgeon: StErline LevineMD;  [place] [facility] Service: Neurosurgery;  Laterality: N/A;  . DILATION AND CURETTAGE OF UTERUS    . IR IMAGING GUIDED PORT INSERTION  10/23/2019  . KYPHOPLASTY N/A 03/15/2020   Procedure: Thoracic Eight KYPHOPLASTY;  Surgeon: StErline LevineMD;  [facility] Service: Neurosurgery;  Laterality: N/A;  prone   . LIPOMA EXCISION  2018   removed under left breast and right thigh.  . TUBAL LIGATION     Social History:  Social History   Socioeconomic History  . Marital status: Married    Spouse name: Not on file  . Number of children: 3  .  Years of education: Not on file  . Highest education level: Not on file  Occupational History  . Occupation: owns Medical illustrator: [employer]  Tobacco Use  . Smoking status: Former Smoker    Packs/day: 0.25    Quit date: 10/31/2012    Years since quitting: 8.1  . Smokeless tobacco: Never Used  Vaping Use  . Vaping Use: Never used  Substance and Sexual Activity  . Alcohol use: Yes    Alcohol/week: 0.0 standard drinks    Comment: rare  . Drug use: No  . Sexual activity: Yes  Other Topics Concern  . Not on file  Social History Narrative  . Not on file   Social Determinants of Health   Financial Resource Strain: Not on file  Food Insecurity: Not on file  Transportation Needs: Not on file  Physical Activity: Not on file  Stress: Not on file  Social Connections: Not on file  Intimate Partner Violence: Not on file   Family History:  Family History  Problem Relation Age of Onset  . Heart disease Father   . Drug abuse Daughter   . Drug abuse Son   . Cancer Sister   . Heart disease Brother   . Heart attack Brother     Review of Systems: Constitutional: Doesn't report fevers, chills or abnormal weight loss Eyes: Doesn't report blurriness of vision Ears, nose, mouth,  throat, and face: Doesn't report sore throat Respiratory: Doesn't report cough, dyspnea or wheezes Cardiovascular: Doesn't report palpitation, chest discomfort  Gastrointestinal:  Doesn't report nausea, constipation, diarrhea GU: Doesn't report incontinence Skin: Doesn't report skin rashes Neurological: Per HPI Musculoskeletal: Doesn't report joint pain Behavioral/Psych: Doesn't report anxiety  Physical Exam: Vitals:   01/02/21 1211  BP: (!) 133/93  Pulse: 96  Resp: 18  Temp: 97.7 F (36.5 C)  SpO2: 97%   KPS: 90. General: Alert, cooperative, pleasant, in no acute distress Head: Normal EENT: No conjunctival injection or scleral icterus.  Lungs: Resp effort normal Cardiac: Regular rate Abdomen: Non-distended abdomen Skin: No rashes cyanosis or petechiae. Extremities: No clubbing or edema  Neurologic Exam: Mental Status: Awake, alert, attentive to examiner. Oriented to self and environment. Language is fluent with intact comprehension.  Cranial Nerves: Visual acuity is grossly normal. Visual fields are full. Extra-ocular movements intact. No ptosis. Face is symmetric Motor: Tone and bulk are normal. Power is full in both arms and legs. Reflexes are symmetric, no pathologic reflexes present.  Sensory: Intact to light touch Gait: Moderate impairment to tandem gait only  Labs: I have reviewed the data as listed    Component Value Date/Time   NA 140 12/15/2020 1138   K 4.1 12/15/2020 1138   CL 105 12/15/2020 1138   CO2 23 12/15/2020 1138   GLUCOSE 111 (H) 12/15/2020 1138   BUN 15 12/15/2020 1138   CREATININE 0.75 12/15/2020 1138   CREATININE 0.58 11/15/2017 0948   CALCIUM 8.3 (L) 12/15/2020 1138   PROT 6.3 (L) 12/15/2020 1138   ALBUMIN 3.3 (L) 12/15/2020 1138   AST 26 12/15/2020 1138   ALT 54 (H) 12/15/2020 1138   ALKPHOS 46 12/15/2020 1138   BILITOT 0.2 (L) 12/15/2020 1138   GFRNONAA >60 12/15/2020 1138   GFRNONAA 100 11/15/2017 0948   GFRAA >60 06/09/2020 1051    GFRAA 116 11/15/2017 0948   Lab Results  Component Value Date   WBC 14.7 (H) 12/15/2020   NEUTROABS 12.1 (H) 12/15/2020   HGB 11.6 (L) 12/15/2020  HCT 33.3 (L) 12/15/2020   MCV 97.4 12/15/2020   PLT 279 12/15/2020    Imaging:  [facility] Clinician Interpretation: I have personally reviewed the CNS images as listed.  My interpretation, in the context of the patient's clinical presentation, is progressive disease  MR Brain W Wo Contrast  Result Date: 12/23/2020 CLINICAL DATA:  Lung cancer with metastatic disease to brain. Prior stereotactic radio surgery. Left occipital resection 12/02/2020 negative for malignant cells EXAM: MRI HEAD WITHOUT AND WITH CONTRAST TECHNIQUE: Multiplanar, multiecho pulse sequences of the brain and surrounding structures were obtained without and with intravenous contrast. CONTRAST:  37m MULTIHANCE GADOBENATE DIMEGLUMINE 529 MG/ML IV SOLN COMPARISON:  MRI head 12/01/2020, 11/04/2020 FINDINGS: Brain: Recent left occipital craniotomy for resection of cerebellar mass. Postsurgical fluid collection with peripheral enhancement is present left cerebellum. Fluid collection measures approximately 21 x 26 mm with mild associated blood products. The inferior portion of the previously noted mass appears to been resected. There appears to be progression of the enhancing lesion superiorly compared to the preoperative study. Progression of surrounding edema in the left cerebellum. Pathology negative for malignant cells. 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 Numerous lesions in the cerebellar vermis and right cerebellum have enlarged in size since the prior study. They all measure under 1 cm. Approximately 10 such lesions are present. 5 mm right occipital lesion axial image 67 is unchanged Linear enhancement in the right parietal lobe axial image 100 shows mild enlargement. 3 x 5 mm enhancing lesion left frontal lobe anteriorly axial image 113 is unchanged 4 mm enhancing lesion left parietal  lobe axial image 123 shows mild progression Diffuse dural thickening and enhancement bilaterally has progressed in the interval. Ventricle size normal. No midline shift. Negative for acute infarct. Patchy white matter hyperintensity bilaterally unchanged. Vascular: Normal arterial flow voids Skull and upper cervical spine: No focal skeletal lesion. Sinuses/Orbits: Mild mucosal edema paranasal sinuses. Negative orbit Other: None IMPRESSION: 1. Postop resection of inferior portion of left cerebellar lesion. Pathology negative for malignancy. There is progression of size of the enhancing lesion and in the left superior cerebellum and progressive edema in the left cerebellum. In addition, there are numerous enhancing lesions in the right cerebellum and cerebellar vermis which have progressed in size since 12/01/2020. It is unclear these other lesions are related to radiation necrosis versus progressive metastatic disease. 2. Progressive dural enhancement bilaterally. 3. Mild enlargement of lesions in the parietal lobe bilaterally. Stable lesions in the right occipital lobe and left frontal lobe. Electronically Signed   By: CFranchot GalloM.D.   On: 12/23/2020 11:51   CT CHEST ABDOMEN PELVIS W CONTRAST  Result Date: 12/14/2020 CLINICAL DATA:  Follow-up metastatic non-small cell lung cancer, ongoing chemotherapy EXAM: CT CHEST, ABDOMEN, AND PELVIS WITH CONTRAST TECHNIQUE: Multidetector CT imaging of the chest, abdomen and pelvis was performed following the standard protocol during bolus administration of intravenous contrast. CONTRAST:  1058mOMNIPAQUE IOHEXOL 300 MG/ML  SOLN COMPARISON:  Multiple priors including most recent CT chest abdomen and pelvis September 16, 2020 FINDINGS: CT CHEST FINDINGS Cardiovascular: Right chest porta catheter with tip in the SVC. Atherosclerotic calcifications of the aorta and branch vessels. No thoracic aortic aneurysm. No central pulmonary embolus. Normal size heart. Calcifications  mitral annulus. Stable mild diffuse pericardial thickening. Mediastinum/Nodes: No discrete thyroid nodularity. No mediastinal, hilar or axillary adenopathy. The trachea and esophagus are grossly unremarkable. Lungs/Pleura: Similar post radiation changes are again noted in the medial right lower lobe. Multiple bilateral ground-glass nodules are again visualized  inter not significantly changed in the interim. No new or enlarging suspicious pulmonary nodules. No focal consolidation. No pleural effusion. No pneumothorax. Musculoskeletal: T8 vertebroplasty again noted. Again seen are a few small sclerotic osseous lesions in the mid and lower thoracic vertebra which appear unchanged. No new suspicious lytic or blastic lesion of bone. CT ABDOMEN PELVIS FINDINGS Hepatobiliary: No suspicious hepatic lesions. Gallbladder is surgically absent. Mild prominence of the biliary tree similar to prior, likely reservoir effect post cholecystectomy. Pancreas: Unremarkable. No pancreatic ductal dilatation or surrounding inflammatory changes. Spleen: Normal in size without focal abnormality. Adrenals/Urinary Tract: Adrenal glands are unremarkable. Kidneys are normal, without renal calculi, focal lesion, or hydronephrosis. Gas in the urinary bladder, recommend correlation with recent instrumentation, otherwise the urinary bladder is unremarkable. Stomach/Bowel: Stomach is grossly unremarkable for degree of distension. No suspicious small bowel wall thickening or dilation. The appendix and terminal ileum appear unremarkable. No suspicious colonic wall thickening or mass like lesions. Scattered left-sided colonic diverticulosis without findings of acute diverticulitis. Vascular/Lymphatic: Aortic atherosclerosis. No gastrohepatic or hepatoduodenal ligament lymphadenopathy. No retroperitoneal or mesenteric lymphadenopathy. No pelvic sidewall lymphadenopathy. No groin lymphadenopathy. Reproductive: Status post hysterectomy. No adnexal masses.  Other: No abdominopelvic ascites. No discrete mesenteric or omental nodularity. Musculoskeletal: No significant change in the sclerotic osseous lesions of the pelvis and lumbar spine. No new suspicious osseous lesions. IMPRESSION: 1. Similar post radiation changes in the medial right lower lobe. 2. No evidence of soft tissue metastatic disease in the chest abdomen or pelvis. 3. No significant change in the sclerotic osseous lesions of the pelvis and lumbar spine. No new suspicious osseous lesions. 4. Multiple bilateral ground-glass nodules are not significantly changed, recommend continued attention on follow-up imaging. No new or enlarging suspicious pulmonary nodules visualized. 5. Gas in the urinary bladder, recommend correlation with recent instrumentation. 6. Aortic atherosclerosis. Aortic Atherosclerosis (ICD10-I70.0). Electronically Signed   By: Dahlia Bailiff MD   On: 12/14/2020 14:53    Pathology: SURGICAL PATHOLOGY  CASE: [phone]  PATIENT: Emmit Alexanders  Surgical Pathology Report   Clinical History: brain tumor, ? radiation necrosis vs. tumor, lung  cancer primary (cm)   FINAL MICROSCOPIC DIAGNOSIS:   A. BRAIN, LEFT CEREBELLAR MASS, BIOPSY:  - Fibrin and inflammatory cells  - No overt malignancy identified   B. BRAIN, LEFT CEREBELLAR MASS, RESECTION:  - Brain parenchyma with fibrin, inflammatory cells and calcifications  - No overt malignancy identified   INTRAOPERATIVE CONSULTATION:   A1. BRAIN, LEFT CEREBELLAR MASS, FROZEN SECTION:     "Fibrin/necrotic tissue consistent with cellular infiltrate, favor  inflammatory cells"     Rapid intraoperative consult diagnosis rendered by Dr. Melina Copa @  1004 12/02/2020.    Assessment/Plan Brain metastases ([place])   Ms. Margulies is clinically stable today following sub-occipital craniotomy, debulking resection of cerebellar radionecrosis mass.  Path demonstrated radiation necrosis, with no tumor cells identifiable.   Unfortunately the bulky remnant of the necrotic mass continues to progress on recent imaging, approaching the brainstem.  Her pattern of progression surrounding other previously treated foci within the posterior fossa is unusual, but we think likely consistent with radiation necrosis as well.  This had been reviewed extensively in brain/spine tumor board, including with radiation dosimetry plan.  Decadron has not been helpful in warding off this (suspected) progressive inflammatory process.  Because of side effect burden, she can decrease decadron to 18m daily x7 days, then stop.  For refractory radiation necrosis, we instead recommend treating with IV bevacizumab 147mkg q3 weeks x3 cycles. We  reviewed this plan in brain/spine tumor board, and with Dr. Lorenso Courier.  This will be added on to next 3 pem/pem infusions starting 01/26/21.  We reviewed side effects including hypertension, bleeding/clotting risk, wound healing impairment.  We will then repeat brain MRI after three cycles.  They are agreeable with this plan.    We appreciate the opportunity to participate in the care of AKAIYA TOUCHETTE.   All questions were answered. The patient knows to call the clinic with any problems, questions or concerns. No barriers to learning were detected.  The total time spent in the encounter was 40 minutes and more than 50% was on counseling and review of test results   Ventura Sellers, MD Medical Director of Neuro-Oncology [facility] [place] 01/02/21 12:06 PM

## 2021-01-04 ENCOUNTER — Telehealth: Payer: Self-pay | Admitting: Hematology and Oncology

## 2021-01-04 ENCOUNTER — Other Ambulatory Visit: Payer: Self-pay | Admitting: Radiation Therapy

## 2021-01-04 NOTE — Telephone Encounter (Signed)
Called and spoke with patient and informed her of change in provider and slight time change for 4/28 appt due to provider family emergency

## 2021-01-05 ENCOUNTER — Other Ambulatory Visit: Payer: Self-pay

## 2021-01-05 ENCOUNTER — Inpatient Hospital Stay: Payer: BC Managed Care – PPO

## 2021-01-05 ENCOUNTER — Other Ambulatory Visit: Payer: BC Managed Care – PPO

## 2021-01-05 ENCOUNTER — Inpatient Hospital Stay (HOSPITAL_BASED_OUTPATIENT_CLINIC_OR_DEPARTMENT_OTHER): Payer: BC Managed Care – PPO | Admitting: Physician Assistant

## 2021-01-05 ENCOUNTER — Inpatient Hospital Stay: Payer: BC Managed Care – PPO | Admitting: Hematology and Oncology

## 2021-01-05 VITALS — BP 117/87 | HR 96

## 2021-01-05 VITALS — BP 138/108 | HR 94 | Temp 98.1°F | Resp 19 | Ht 62.0 in | Wt 150.9 lb

## 2021-01-05 DIAGNOSIS — Z95828 Presence of other vascular implants and grafts: Secondary | ICD-10-CM

## 2021-01-05 DIAGNOSIS — I158 Other secondary hypertension: Secondary | ICD-10-CM

## 2021-01-05 DIAGNOSIS — Z8249 Family history of ischemic heart disease and other diseases of the circulatory system: Secondary | ICD-10-CM | POA: Diagnosis not present

## 2021-01-05 DIAGNOSIS — C7951 Secondary malignant neoplasm of bone: Secondary | ICD-10-CM

## 2021-01-05 DIAGNOSIS — Z79899 Other long term (current) drug therapy: Secondary | ICD-10-CM | POA: Diagnosis not present

## 2021-01-05 DIAGNOSIS — Z87891 Personal history of nicotine dependence: Secondary | ICD-10-CM | POA: Diagnosis not present

## 2021-01-05 DIAGNOSIS — C3490 Malignant neoplasm of unspecified part of unspecified bronchus or lung: Secondary | ICD-10-CM

## 2021-01-05 DIAGNOSIS — G939 Disorder of brain, unspecified: Secondary | ICD-10-CM | POA: Diagnosis not present

## 2021-01-05 DIAGNOSIS — C7931 Secondary malignant neoplasm of brain: Secondary | ICD-10-CM

## 2021-01-05 DIAGNOSIS — I10 Essential (primary) hypertension: Secondary | ICD-10-CM | POA: Diagnosis not present

## 2021-01-05 DIAGNOSIS — I700 Atherosclerosis of aorta: Secondary | ICD-10-CM | POA: Diagnosis not present

## 2021-01-05 DIAGNOSIS — Z5111 Encounter for antineoplastic chemotherapy: Secondary | ICD-10-CM | POA: Diagnosis not present

## 2021-01-05 DIAGNOSIS — Z9049 Acquired absence of other specified parts of digestive tract: Secondary | ICD-10-CM | POA: Diagnosis not present

## 2021-01-05 DIAGNOSIS — C3431 Malignant neoplasm of lower lobe, right bronchus or lung: Secondary | ICD-10-CM | POA: Diagnosis not present

## 2021-01-05 DIAGNOSIS — R918 Other nonspecific abnormal finding of lung field: Secondary | ICD-10-CM

## 2021-01-05 DIAGNOSIS — R52 Pain, unspecified: Secondary | ICD-10-CM | POA: Diagnosis not present

## 2021-01-05 DIAGNOSIS — Z5112 Encounter for antineoplastic immunotherapy: Secondary | ICD-10-CM | POA: Diagnosis not present

## 2021-01-05 DIAGNOSIS — K5730 Diverticulosis of large intestine without perforation or abscess without bleeding: Secondary | ICD-10-CM | POA: Diagnosis not present

## 2021-01-05 DIAGNOSIS — Z923 Personal history of irradiation: Secondary | ICD-10-CM | POA: Diagnosis not present

## 2021-01-05 DIAGNOSIS — E876 Hypokalemia: Secondary | ICD-10-CM | POA: Diagnosis not present

## 2021-01-05 LAB — CBC WITH DIFFERENTIAL (CANCER CENTER ONLY)
Abs Immature Granulocytes: 0.42 10*3/uL — ABNORMAL HIGH (ref 0.00–0.07)
Basophils Absolute: 0.1 10*3/uL (ref 0.0–0.1)
Basophils Relative: 1 %
Eosinophils Absolute: 0 10*3/uL (ref 0.0–0.5)
Eosinophils Relative: 0 %
HCT: 33.6 % — ABNORMAL LOW (ref 36.0–46.0)
Hemoglobin: 11.8 g/dL — ABNORMAL LOW (ref 12.0–15.0)
Immature Granulocytes: 4 %
Lymphocytes Relative: 14 %
Lymphs Abs: 1.4 10*3/uL (ref 0.7–4.0)
MCH: 34.4 pg — ABNORMAL HIGH (ref 26.0–34.0)
MCHC: 35.1 g/dL (ref 30.0–36.0)
MCV: 98 fL (ref 80.0–100.0)
Monocytes Absolute: 1.1 10*3/uL — ABNORMAL HIGH (ref 0.1–1.0)
Monocytes Relative: 11 %
Neutro Abs: 6.9 10*3/uL (ref 1.7–7.7)
Neutrophils Relative %: 70 %
Platelet Count: 270 10*3/uL (ref 150–400)
RBC: 3.43 MIL/uL — ABNORMAL LOW (ref 3.87–5.11)
RDW: 15.1 % (ref 11.5–15.5)
WBC Count: 9.9 10*3/uL (ref 4.0–10.5)
nRBC: 0 % (ref 0.0–0.2)

## 2021-01-05 LAB — CMP (CANCER CENTER ONLY)
ALT: 31 U/L (ref 0–44)
AST: 18 U/L (ref 15–41)
Albumin: 3.3 g/dL — ABNORMAL LOW (ref 3.5–5.0)
Alkaline Phosphatase: 55 U/L (ref 38–126)
Anion gap: 8 (ref 5–15)
BUN: 11 mg/dL (ref 8–23)
CO2: 29 mmol/L (ref 22–32)
Calcium: 8.5 mg/dL — ABNORMAL LOW (ref 8.9–10.3)
Chloride: 103 mmol/L (ref 98–111)
Creatinine: 0.79 mg/dL (ref 0.44–1.00)
GFR, Estimated: >60 mL/min (ref >60)
Glucose, Bld: 121 mg/dL — ABNORMAL HIGH (ref 70–99)
Potassium: 4.1 mmol/L (ref 3.5–5.1)
Sodium: 140 mmol/L (ref 135–145)
Total Bilirubin: 0.3 mg/dL (ref 0.3–1.2)
Total Protein: 6.4 g/dL — ABNORMAL LOW (ref 6.5–8.1)

## 2021-01-05 LAB — TSH: TSH: 1.026 u[IU]/mL (ref 0.308–3.960)

## 2021-01-05 MED ORDER — ZOLEDRONIC ACID 4 MG/100ML IV SOLN
4.0000 mg | Freq: Once | INTRAVENOUS | Status: AC
  Administered 2021-01-05: 4 mg via INTRAVENOUS

## 2021-01-05 MED ORDER — PROCHLORPERAZINE MALEATE 10 MG PO TABS
10.0000 mg | ORAL_TABLET | Freq: Once | ORAL | Status: AC
  Administered 2021-01-05: 10 mg via ORAL

## 2021-01-05 MED ORDER — SODIUM CHLORIDE 0.9% FLUSH
10.0000 mL | INTRAVENOUS | Status: DC | PRN
Start: 2021-01-05 — End: 2021-01-05
  Administered 2021-01-05: 10 mL
  Filled 2021-01-05: qty 10

## 2021-01-05 MED ORDER — ZOLEDRONIC ACID 4 MG/100ML IV SOLN
INTRAVENOUS | Status: AC
  Filled 2021-01-05: qty 100

## 2021-01-05 MED ORDER — SODIUM CHLORIDE 0.9 % IV SOLN
Freq: Once | INTRAVENOUS | Status: AC
  Filled 2021-01-05: qty 250

## 2021-01-05 MED ORDER — SODIUM CHLORIDE 0.9 % IV SOLN
200.0000 mg | Freq: Once | INTRAVENOUS | Status: AC
  Administered 2021-01-05: 200 mg via INTRAVENOUS
  Filled 2021-01-05: qty 8

## 2021-01-05 MED ORDER — SODIUM CHLORIDE 0.9 % IV SOLN
500.0000 mg/m2 | Freq: Once | INTRAVENOUS | Status: AC
  Administered 2021-01-05: 800 mg via INTRAVENOUS
  Filled 2021-01-05: qty 20

## 2021-01-05 MED ORDER — CYANOCOBALAMIN 1000 MCG/ML IJ SOLN: 1000.0000 ug | Freq: Once | INTRAMUSCULAR | Status: DC

## 2021-01-05 MED ORDER — SODIUM CHLORIDE 0.9% FLUSH
10.0000 mL | Freq: Once | INTRAVENOUS | Status: AC
  Administered 2021-01-05: 10 mL
  Filled 2021-01-05: qty 10

## 2021-01-05 MED ORDER — PROCHLORPERAZINE MALEATE 10 MG PO TABS
ORAL_TABLET | ORAL | Status: AC
  Filled 2021-01-05: qty 1

## 2021-01-05 MED ORDER — HEPARIN SOD (PORK) LOCK FLUSH 100 UNIT/ML IV SOLN
500.0000 [IU] | Freq: Once | INTRAVENOUS | Status: AC | PRN
  Administered 2021-01-05: 500 [IU]
  Filled 2021-01-05: qty 5

## 2021-01-05 NOTE — Patient Instructions (Signed)
[facility]  Discharge Instructions: Thank you for choosing [facility] to provide your oncology and hematology care.   If you have a lab appointment with the [facility], please go directly to the [facility] and check in at the registration area.   Wear comfortable clothing and clothing appropriate for easy access to any Portacath or PICC line.   We strive to give you quality time with your provider. You may need to reschedule your appointment if you arrive late (15 or more minutes).  Arriving late affects you and other patients whose appointments are after yours.  Also, if you miss three or more appointments without notifying the office, you may be dismissed from the clinic at the provider's discretion.      For prescription refill requests, have your pharmacy contact our office and allow 72 hours for refills to be completed.    Today you received the following chemotherapy and/or immunotherapy agents: Pembrolizumab (Keytruda) and Pemetrexed (Alimta)   To help prevent nausea and vomiting after your treatment, we encourage you to take your nausea medication as directed.  BELOW ARE SYMPTOMS THAT SHOULD BE REPORTED IMMEDIATELY: . *FEVER GREATER THAN 100.4 F (38 C) OR HIGHER . *CHILLS OR SWEATING . *NAUSEA AND VOMITING THAT IS NOT CONTROLLED WITH YOUR NAUSEA MEDICATION . *UNUSUAL SHORTNESS OF BREATH . *UNUSUAL BRUISING OR BLEEDING . *URINARY PROBLEMS (pain or burning when urinating, or frequent urination) . *BOWEL PROBLEMS (unusual diarrhea, constipation, pain near the anus) . TENDERNESS IN MOUTH AND THROAT WITH OR WITHOUT PRESENCE OF ULCERS (sore throat, sores in mouth, or a toothache) . UNUSUAL RASH, SWELLING OR PAIN  . UNUSUAL VAGINAL DISCHARGE OR ITCHING   Items with * indicate a potential emergency and should be followed up as soon as possible or go to the Emergency Department if any problems should occur.  Please show the  CHEMOTHERAPY ALERT CARD or IMMUNOTHERAPY ALERT CARD at check-in to the Emergency Department and triage nurse.  Should you have questions after your visit or need to cancel or reschedule your appointment, please contact [facility]  Dept: [phone]  and follow the prompts.  Office hours are 8:00 a.m. to 4:30 p.m. Monday - Friday. Please note that voicemails left after 4:00 p.m. may not be returned until the following business day.  We are closed weekends and major holidays. You have access to a nurse at all times for urgent questions. Please call the main number to the clinic Dept: [phone] and follow the prompts.   For any non-urgent questions, you may also contact your provider using MyChart. We now offer e-Visits for anyone 41 and older to request care online for non-urgent symptoms. For details visit [email or web address].   Also download the MyChart app! Go to the app store, search "MyChart", open the app, select [facility], and log in with your MyChart username and password.  Due to Covid, a mask is required upon entering the hospital/clinic. If you do not have a mask, one will be given to you upon arrival. For doctor visits, patients may have 1 support person aged 10 or older with them. For treatment visits, patients cannot have anyone with them due to current Covid guidelines and our immunocompromised population.   Zoledronic Acid Injection (Hypercalcemia, Oncology) What is this medicine? ZOLEDRONIC ACID (ZOE le dron ik AS id) slows calcium loss from bones. It high calcium levels in the blood from some kinds of cancer. It may be used  in other people at risk for bone loss. This medicine may be used for other purposes; ask your health care provider or pharmacist if you have questions. COMMON BRAND NAME(S): Zometa What should I tell my health care provider before I take this medicine? They need to know if you have any of these  conditions:  cancer  dehydration  dental disease  kidney disease  liver disease  low levels of calcium in the blood  lung or breathing disease (asthma)  receiving steroids like dexamethasone or prednisone  an unusual or allergic reaction to zoledronic acid, other medicines, foods, dyes, or preservatives  pregnant or trying to get pregnant  breast-feeding How should I use this medicine? This drug is injected into a vein. It is given by a health care provider in a hospital or clinic setting. Talk to your health care provider about the use of this drug in children. Special care may be needed. Overdosage: If you think you have taken too much of this medicine contact a poison control center or emergency room at once. NOTE: This medicine is only for you. Do not share this medicine with others. What if I miss a dose? Keep appointments for follow-up doses. It is important not to miss your dose. Call your health care provider if you are unable to keep an appointment. What may interact with this medicine?  certain antibiotics given by injection  NSAIDs, medicines for pain and inflammation, like ibuprofen or naproxen  some diuretics like bumetanide, furosemide  teriparatide  thalidomide This list may not describe all possible interactions. Give your health care provider a list of all the medicines, herbs, non-prescription drugs, or dietary supplements you use. Also tell them if you smoke, drink alcohol, or use illegal drugs. Some items may interact with your medicine. What should I watch for while using this medicine? Visit your health care provider for regular checks on your progress. It may be some time before you see the benefit from this drug. Some people who take this drug have severe bone, joint, or muscle pain. This drug may also increase your risk for jaw problems or a broken thigh bone. Tell your health care provider right away if you have severe pain in your jaw, bones,  joints, or muscles. Tell you health care provider if you have any pain that does not go away or that gets worse. Tell your dentist and dental surgeon that you are taking this drug. You should not have major dental surgery while on this drug. See your dentist to have a dental exam and fix any dental problems before starting this drug. Take good care of your teeth while on this drug. Make sure you see your dentist for regular follow-up appointments. You should make sure you get enough calcium and vitamin D while you are taking this drug. Discuss the foods you eat and the vitamins you take with your health care provider. Check with your health care provider if you have severe diarrhea, nausea, and vomiting, or if you sweat a lot. The loss of too much body fluid may make it dangerous for you to take this drug. You may need blood work done while you are taking this drug. Do not become pregnant while taking this drug. Women should inform their health care provider if they wish to become pregnant or think they might be pregnant. There is potential for serious harm to an unborn child. Talk to your health care provider for more information. What side effects may I notice  from receiving this medicine? Side effects that you should report to your doctor or health care provider as soon as possible:  allergic reactions (skin rash, itching or hives; swelling of the face, lips, or tongue)  bone pain  infection (fever, chills, cough, sore throat, pain or trouble passing urine)  jaw pain, especially after dental work  joint pain  kidney injury (trouble passing urine or change in the amount of urine)  low blood pressure (dizziness; feeling faint or lightheaded, falls; unusually weak or tired)  low calcium levels (fast heartbeat; muscle cramps or pain; pain, tingling, or numbness in the hands or feet; seizures)  low magnesium levels (fast, irregular heartbeat; muscle cramp or pain; muscle weakness; tremors;  seizures)  low red blood cell counts (trouble breathing; feeling faint; lightheaded, falls; unusually weak or tired)  muscle pain  redness, blistering, peeling, or loosening of the skin, including inside the mouth  severe diarrhea  swelling of the ankles, feet, hands  trouble breathing Side effects that usually do not require medical attention (report to your doctor or health care provider if they continue or are bothersome):  anxious  constipation  coughing  depressed mood  eye irritation, itching, or pain  fever  general ill feeling or flu-like symptoms  nausea  pain, redness, or irritation at site where injected  trouble sleeping This list may not describe all possible side effects. Call your doctor for medical advice about side effects. You may report side effects to FDA at 1-800-FDA-1088. Where should I keep my medicine? This drug is given in a hospital or clinic. It will not be stored at home. NOTE: This sheet is a summary. It may not cover all possible information. If you have questions about this medicine, talk to your doctor, pharmacist, or health care provider.  2021 [employer] Standard (2019-06-11 09:13:00)

## 2021-01-05 NOTE — Progress Notes (Signed)
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 CT of the head showed showed concerning for metastatic disease involving the left cerebellum and right occipital lobe.  2) 09/30/2019: MRI brain confirms mass within the left cerebellum measures 1.6 x 1.3 x 1.5 cm as well as at least 8 metastatic lesions within the supratentorial and infratentorial brain as outlined 3) 10/01/2019: PET CT scan revealed hypermetabolic infrahilar right lower lobe mass with hypermetabolic nodal metastases in the right hilum, mediastinum, right supraclavicular region, left axilla and lower left neck. 4) 10/08/2019: US guided biopsy of the lymph nodes confirms poorly differentiated non-small cell carcinoma. Immunohistochemistry confirms adenocarcinoma of lung primary. PD-L1 and NGS later revealed a MET Exon 14 mutation and TPS of 90%.  5) 10/12/2019: Establish care with Dr. Lorenso Courier  6) 11/04/2019: Day 1 of capmatinib $RemoveBefor'400mg'TXvWjrWLEhIV$  BID 7) 11/09/2019: presented to Rad/Onc with a DVT in LLE. Seen in symptom management clinic and started on apixaban.  8) 12/29/2019: CT C/A/P showed interval decrease in size of mass involving the superior segment of right lower lobe and reduction in mediastinal and left supraclavicular adenopathy though some small spinal lesions were noted in the interim between the two sets of scans 9) 01/25/2020: temporarily stopped capmatinib due to symptoms of nausea/fatigue. Restarted therapy on 01/30/2020 after brief chemo holiday.  10) 03/22/2020: CT C/A/P reveals a mixed picture, with new lung nodule and  increased lymphadenopathy, however response in bone lesions. D/c capmatinib therapy 11) 04/08/2020: start of Carbo/Pem/Pem for 2nd line treatment. Cycle 1 Day 1   12) 04/29/2020: Cycle 2 Day 1 Carbo/Pem/Pem  13) 05/20/2020: Cycle 3 Day 1 Carbo/Pem/Pem  14) 06/09/2020: Cycle 4 Day 1 Carbo/Pem/Pem  15) 06/29/2020: Cycle 5 Day 1 maintenance Pem/Pem  16) 07/20/2020: Cycle 6 Day 1 maintenance Pem/Pem  17) 08/12/2020: Cycle 7 Day 1 maintenance Pem/Pem  18) 09/01/2020: Cycle 8 Day 1 maintenance Pem/Pem  19) 09/21/2020: Cycle 9 Day 1 maintenance Pem/Pem  20) 10/13/2020: Cycle 10 Day 1 maintenance Pem/Pem  21) 11/03/2020: Cycle 11 Day 1 maintenance Pem/Pem  22) 11/25/2020:  Cycle 12 Day 1 maintenance Pem/Pem 23) 12/15/2020:  Cycle 13 Day 1 maintenance Pem/Pem 24) 01/05/2021:  Cycle 14 Day 1 maintenance Pem/Pem 25) 01/26/2021: planned Cycle 15 Day maintenance Pem/Pem plus Becacizumab.   Interval History:  Virginia [name] 65 y.o. female with medical history significant for metastatic adenocarcinoma of the lung presents for a follow up visit. The patient's last visit was on 12/15/2020 prior to the start of Cycle 13 Day 1 of Pem/Pem. In the interim since the last visit, she underwent restaging with MRI brain and CT CAP. 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 Patient was evaluated by Dr. Mickeal Skinner who recommended to add IV Bevacizumab 10 mg/kg q 3 weeks starting 01/26/2021. In addition, the recommendation was to taper down dexamethasone to 1 mg daily x 7 days and then stop.  Today is Cycle 14 Day 1.   On exam today Mrs. Moist is accompanied by her husband.  She reports that she has been well in the interim since her last visit.  She  has had no major side effects as result of her chemotherapy treatment. Patient does have intermittent episodes of dizziness that does interfere with her balance. She notes the swelling in her face and lower extremities have  improved since tapering the dexamethasone that she is due to finish on Monday, 01/09/2021. Patient denies any nausea, vomiting or abdominal pain. Her bowel movements are regular without any diarrhea or constipation. Patient denies easy bruising or signs of bleeding. She denies any fevers, chills, shortness of breath, chest pain, cough, neuropathy or skin changes. She has no other complaints.  A full 10 point ROS is listed below.   MEDICAL HISTORY:  Past Medical History:  Diagnosis Date  . Anemia   . Anxiety   . Concussion 09/28/2019  . DVT (deep venous thrombosis) ([place]) 2021   L leg  . Dyspnea   . GERD (gastroesophageal reflux disease)   . Hypercholesterolemia    per pt, she does not have elevated lipids  . Hypertension   . met lung ca dx'd 09/2019   mets to spine, hip and brain  . PONV (postoperative nausea and vomiting)   . Tobacco abuse     SURGICAL HISTORY: Past Surgical History:  Procedure Laterality Date  . ABDOMINAL HYSTERECTOMY     partial/ left ovaries  . APPLICATION OF CRANIAL NAVIGATION N/A 12/02/2020   Procedure: APPLICATION OF CRANIAL NAVIGATION;  Surgeon: Erline Levine, MD;  Location: [place];  Service: Neurosurgery;  Laterality: N/A;  . CHOLECYSTECTOMY    . CRANIOTOMY N/A 12/02/2020   Procedure: Posterior fossa craniotomy for tumor resection with brainlab;  Surgeon: Erline Levine, MD;  Location: [place];  Service: Neurosurgery;  Laterality: N/A;  . DILATION AND CURETTAGE OF UTERUS    . IR IMAGING GUIDED PORT INSERTION  10/23/2019  . KYPHOPLASTY N/A 03/15/2020   Procedure: Thoracic Eight KYPHOPLASTY;  Surgeon: Erline Levine, MD;  Location: [place];  Service: Neurosurgery;  Laterality: N/A;  prone   . LIPOMA EXCISION  2018   removed under left breast and right thigh.  . TUBAL LIGATION      ALLERGIES:  has No Known Allergies.  MEDICATIONS:  Current Outpatient Medications  Medication Sig Dispense Refill  . acetaminophen (TYLENOL) 500 MG tablet Take 1,000 mg by mouth every  8 (eight) hours as needed for moderate pain.    Marland Kitchen albuterol (VENTOLIN HFA) 108 (90 Base) MCG/ACT inhaler INHALE 2 PUFFS INTO THE LUNGS EVERY 6 HOURS AS NEEDED FOR WHEEZING OR SHORTNESS OF BREATH (Patient taking differently: Inhale 2 puffs into the lungs every 6 (six) hours as needed for shortness of breath or wheezing.) 6.7 g 11  . ALPRAZolam (XANAX) 0.25 MG tablet Take 1 tablet (0.25 mg total) by mouth 2 (two) times daily as needed. for anxiety (Patient taking differently: Take 0.25 mg by mouth 2 (two) times daily as needed for anxiety.) 60 tablet 5  . amLODipine (NORVASC) 5 MG tablet TAKE 1 TABLET BY MOUTH ONCE A DAY 30 tablet 3  . Cyanocobalamin (VITAMIN B12 PO) Take 2 tablets by mouth daily. Gummies    . dexamethasone (DECADRON) 1 MG tablet Take 1 tablet (1 mg total) by mouth daily. 30 tablet 1  . folic acid (FOLVITE) 1 MG tablet TAKE 1 TABLET BY MOUTH DAILY. (Patient taking differently: Take 1 mg by mouth daily.) 90 tablet 1  . lidocaine-prilocaine (EMLA) cream Apply 1 application topically as needed. Apply to port as needed. 30 g 0  . Multiple Vitamins-Minerals (AIRBORNE PO) Take 1 tablet by mouth  daily.     . ofloxacin (OCUFLOX) 0.3 % ophthalmic solution 2 drops each eye 4 times a day for 5 days (Patient taking differently: Place 2 drops into both eyes daily as needed (irritation).) 5 mL 1  . OLANZapine (ZYPREXA) 2.5 MG tablet TAKE 1 TABLET BY MOUTH EVERY NIGHT AT BEDTIME 90 tablet 1  . ondansetron (ZOFRAN) 4 MG tablet TAKE 1 TO 2 TABLETS BY MOUTH 2 TIMES DAILY AS NEEDED (Patient not taking: Reported on 01/02/2021) 60 tablet 0  . oxyCODONE (OXY IR/ROXICODONE) 5 MG immediate release tablet TAKE 1-2 TABLETS BY MOUTH EVERY 4 HOURS AS NEEDED FOR SEVERE PAIN 90 tablet 0  . pantoprazole (PROTONIX) 40 MG tablet TAKE ONE TABLET BY MOUTH DAILY (Patient taking differently: Take 40 mg by mouth daily.) 90 tablet 3  . pentoxifylline (TRENTAL) 400 MG CR tablet TAKE 1 TABLET BY MOUTH WITH FOOD FOR 1 WEEK THEN  TAKE 1 TABLET BY MOUTH 2 TIMES DAILY THEREAFTER 60 tablet 2  . polyethylene glycol (MIRALAX / GLYCOLAX) 17 g packet Take 17 g by mouth 2 (two) times daily.     . promethazine (PHENERGAN) 12.5 MG tablet TAKE 1 TO 2 TABLETS BY MOUTH EVERY 4 HOURS AS NEEDED FOR REFRACTORY NAUSEA OR VOMITING 30 tablet 0  . Vitamin D3 (VITAMIN D) 25 MCG tablet Take 2,000 Units by mouth daily.    . vitamin E 180 MG (400 UNITS) capsule Take 400 IU daily x 1 week, then 400 IU BID (Patient taking differently: Take 400 Units by mouth in the morning and at bedtime. Take 400 IU daily x 1 week, then 400 IU BID) 60 capsule 4  . XTAMPZA ER 13.5 MG C12A Take 1 capsule by mouth in the morning and at bedtime. (every 12 hours) 60 capsule 0   No current facility-administered medications for this visit.    REVIEW OF SYSTEMS:   Constitutional: ( - ) fevers, ( - )  chills , ( - ) night sweats Eyes: ( - ) blurriness of vision, ( - ) double vision, ( - ) watery eyes Ears, nose, mouth, throat, and face: ( - ) mucositis, ( - ) sore throat Respiratory: ( - ) cough, ( - ) dyspnea, ( - ) wheezes Cardiovascular: ( - ) palpitation, ( - ) chest discomfort, ( - ) lower extremity swelling Gastrointestinal:  ( - ) nausea, ( - ) heartburn, ( - ) change in bowel habits Skin: ( - ) abnormal skin rashes Lymphatics: ( - ) new lymphadenopathy, ( - ) easy bruising Neurological: ( - ) numbness, ( - ) tingling, ( - ) new weaknesses Behavioral/Psych: ( - ) mood change, ( - ) new changes  All other systems were reviewed with the patient and are negative.  PHYSICAL EXAMINATION: ECOG PERFORMANCE STATUS: 1 - Symptomatic but completely ambulatory  Vitals:   01/05/21 1119  BP: (!) 138/108  Pulse: 94  Resp: 19  Temp: 98.1 F (36.7 C)  SpO2: 97%   Filed Weights   01/05/21 1119  Weight: 150 lb 14.4 oz (68.4 kg)    GENERAL: well appearing middle aged Caucasian female in NAD  SKIN: skin color, texture, turgor are normal, no rashes or significant  lesions EYES: conjunctiva are pink and non-injected, sclera clear LUNGS: clear to auscultation and percussion with normal breathing effort HEART: regular rate & rhythm and no murmurs and bilateral + trace pitting lower extremity edema Musculoskeletal: no cyanosis of digits and no clubbing PSYCH: alert & oriented x 3,  fluent speech NEURO: no focal motor/sensory deficits   LABORATORY DATA:  I have reviewed the data as listed CBC Latest Ref Rng & Units 01/05/2021 12/15/2020 11/30/2020  WBC 4.0 - 10.5 K/uL 9.9 14.7(H) 7.5  Hemoglobin 12.0 - 15.0 g/dL 11.8(L) 11.6(L) 12.6  Hematocrit 36.0 - 46.0 % 33.6(L) 33.3(L) 37.8  Platelets 150 - 400 K/uL 270 279 240    CMP Latest Ref Rng & Units 12/15/2020 11/30/2020 11/24/2020  Glucose 70 - 99 mg/dL 111(H) 150(H) 127(H)  BUN 8 - 23 mg/dL $Remove'15 11 17  'mkBXsbZ$ Creatinine 0.44 - 1.00 mg/dL 0.75 0.62 0.78  Sodium 135 - 145 mmol/L 140 136 138  Potassium 3.5 - 5.1 mmol/L 4.1 4.0 4.2  Chloride 98 - 111 mmol/L 105 103 105  CO2 22 - 32 mmol/L $RemoveB'23 27 25  'NBprTQVw$ Calcium 8.9 - 10.3 mg/dL 8.3(L) 8.9 8.7(L)  Total Protein 6.5 - 8.1 g/dL 6.3(L) - 6.6  Total Bilirubin 0.3 - 1.2 mg/dL 0.2(L) - 0.3  Alkaline Phos 38 - 126 U/L 46 - 59  AST 15 - 41 U/L 26 - 20  ALT 0 - 44 U/L 54(H) - 39     RADIOGRAPHIC STUDIES: I personally have viewed the radiographic studies below: Table disease from prior, no new lesions or new metastatic disease. MR Brain W Wo Contrast  Result Date: 12/23/2020 CLINICAL DATA:  Lung cancer with metastatic disease to brain. Prior stereotactic radio surgery. Left occipital resection 12/02/2020 negative for malignant cells EXAM: MRI HEAD WITHOUT AND WITH CONTRAST TECHNIQUE: Multiplanar, multiecho pulse sequences of the brain and surrounding structures were obtained without and with intravenous contrast. CONTRAST:  85mL MULTIHANCE GADOBENATE DIMEGLUMINE 529 MG/ML IV SOLN COMPARISON:  MRI head 12/01/2020, 11/04/2020 FINDINGS: Brain: Recent left occipital craniotomy for  resection of cerebellar mass. Postsurgical fluid collection with peripheral enhancement is present left cerebellum. Fluid collection measures approximately 21 x 26 mm with mild associated blood products. The inferior portion of the previously noted mass appears to been resected. There appears to be progression of the enhancing lesion superiorly compared to the preoperative study. Progression of surrounding edema in the left cerebellum. Pathology negative for malignant cells. 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 Numerous lesions in the cerebellar vermis and right cerebellum have enlarged in size since the prior study. They all measure under 1 cm. Approximately 10 such lesions are present. 5 mm right occipital lesion axial image 67 is unchanged Linear enhancement in the right parietal lobe axial image 100 shows mild enlargement. 3 x 5 mm enhancing lesion left frontal lobe anteriorly axial image 113 is unchanged 4 mm enhancing lesion left parietal lobe axial image 123 shows mild progression Diffuse dural thickening and enhancement bilaterally has progressed in the interval. Ventricle size normal. No midline shift. Negative for acute infarct. Patchy white matter hyperintensity bilaterally unchanged. Vascular: Normal arterial flow voids Skull and upper cervical spine: No focal skeletal lesion. Sinuses/Orbits: Mild mucosal edema paranasal sinuses. Negative orbit Other: None IMPRESSION: 1. Postop resection of inferior portion of left cerebellar lesion. Pathology negative for malignancy. There is progression of size of the enhancing lesion and in the left superior cerebellum and progressive edema in the left cerebellum. In addition, there are numerous enhancing lesions in the right cerebellum and cerebellar vermis which have progressed in size since 12/01/2020. It is unclear these other lesions are related to radiation necrosis versus progressive metastatic disease. 2. Progressive dural enhancement bilaterally. 3. Mild  enlargement of lesions in the parietal lobe bilaterally. Stable lesions in the right occipital lobe  and left frontal lobe. Electronically Signed   By: Franchot Gallo M.D.   On: 12/23/2020 11:51   CT CHEST ABDOMEN PELVIS W CONTRAST  Result Date: 12/14/2020 CLINICAL DATA:  Follow-up metastatic non-small cell lung cancer, ongoing chemotherapy EXAM: CT CHEST, ABDOMEN, AND PELVIS WITH CONTRAST TECHNIQUE: Multidetector CT imaging of the chest, abdomen and pelvis was performed following the standard protocol during bolus administration of intravenous contrast. CONTRAST:  186mL OMNIPAQUE IOHEXOL 300 MG/ML  SOLN COMPARISON:  Multiple priors including most recent CT chest abdomen and pelvis September 16, 2020 FINDINGS: CT CHEST FINDINGS Cardiovascular: Right chest porta catheter with tip in the SVC. Atherosclerotic calcifications of the aorta and branch vessels. No thoracic aortic aneurysm. No central pulmonary embolus. Normal size heart. Calcifications mitral annulus. Stable mild diffuse pericardial thickening. Mediastinum/Nodes: No discrete thyroid nodularity. No mediastinal, hilar or axillary adenopathy. The trachea and esophagus are grossly unremarkable. Lungs/Pleura: Similar post radiation changes are again noted in the medial right lower lobe. Multiple bilateral ground-glass nodules are again visualized inter not significantly changed in the interim. No new or enlarging suspicious pulmonary nodules. No focal consolidation. No pleural effusion. No pneumothorax. Musculoskeletal: T8 vertebroplasty again noted. Again seen are a few small sclerotic osseous lesions in the mid and lower thoracic vertebra which appear unchanged. No new suspicious lytic or blastic lesion of bone. CT ABDOMEN PELVIS FINDINGS Hepatobiliary: No suspicious hepatic lesions. Gallbladder is surgically absent. Mild prominence of the biliary tree similar to prior, likely reservoir effect post cholecystectomy. Pancreas: Unremarkable. No pancreatic ductal  dilatation or surrounding inflammatory changes. Spleen: Normal in size without focal abnormality. Adrenals/Urinary Tract: Adrenal glands are unremarkable. Kidneys are normal, without renal calculi, focal lesion, or hydronephrosis. Gas in the urinary bladder, recommend correlation with recent instrumentation, otherwise the urinary bladder is unremarkable. Stomach/Bowel: Stomach is grossly unremarkable for degree of distension. No suspicious small bowel wall thickening or dilation. The appendix and terminal ileum appear unremarkable. No suspicious colonic wall thickening or mass like lesions. Scattered left-sided colonic diverticulosis without findings of acute diverticulitis. Vascular/Lymphatic: Aortic atherosclerosis. No gastrohepatic or hepatoduodenal ligament lymphadenopathy. No retroperitoneal or mesenteric lymphadenopathy. No pelvic sidewall lymphadenopathy. No groin lymphadenopathy. Reproductive: Status post hysterectomy. No adnexal masses. Other: No abdominopelvic ascites. No discrete mesenteric or omental nodularity. Musculoskeletal: No significant change in the sclerotic osseous lesions of the pelvis and lumbar spine. No new suspicious osseous lesions. IMPRESSION: 1. Similar post radiation changes in the medial right lower lobe. 2. No evidence of soft tissue metastatic disease in the chest abdomen or pelvis. 3. No significant change in the sclerotic osseous lesions of the pelvis and lumbar spine. No new suspicious osseous lesions. 4. Multiple bilateral ground-glass nodules are not significantly changed, recommend continued attention on follow-up imaging. No new or enlarging suspicious pulmonary nodules visualized. 5. Gas in the urinary bladder, recommend correlation with recent instrumentation. 6. Aortic atherosclerosis. Aortic Atherosclerosis (ICD10-I70.0). Electronically Signed   By: Dahlia Bailiff MD   On: 12/14/2020 14:53    ASSESSMENT & PLAN [name] 65 y.o. female with medical history  significant for metastatic adenocarcinoma of the lung presents for a follow up visit. 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   Previously we discussed Carboplatin/Pembrolizumab/Pemetrexed and the expected side effect from these medications.  We discussed the possible side effects of immunotherapy including colitis, hepatitis, dermatitis, and pneumonitis.  Additionally we discussed the side effects of carboplatin which include suppression of blood counts, nephrotoxicity, and nausea.  Additionally we discussed pemetrexed which can also have effects on counts and would require supplementation with vitamin Z61 and folic acid.  She voiced her understanding of these side effects and treatment plans moving forward.  I also noted that we would be able to drop the chemotherapy agents that she had intolerance to continue with pembrolizumab alone given her high TPS score.  The patient has a markedly elevated TPS score (90%)   Ms. Loud continues to tolerate the treatment. Her labs from today were reviewed without any intervention needed. Patient will proceed with treatment as planned and return to the clinic in 3 weeks prior to C15.   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 --port in place, patient completed chemotherapy education.  --plan for repeat CT C/A/P in July 2022 (3 months from last scan in April 2022) --supportive therapy as listed below.  --RTC q 3 weeks for continued monitoring/chemotherapy treatments.    #Nausea/Vomiting -- provided patient with Zofran 4-8mg  q8h PRN and compazine 10mg  q6H for breakthrough PRN. These have not been required since olanzapine was added --continue olanzpaine 2.5mg  PO QHS to help with nausea. Marked improvement since the addition of  this medication on 01/29/2020.   --continue to monitor   #Hypokalemia, stable --likely 2/2 to decreased PO intake, hypovolemia, and BP medications --Potassium level is within normal limits today.  --continue to monitor  #Pain Control, stable --continue oxycodone 5mg  q4H PRN. Can increase dose to 10mg  q6H if pain is severe. She is taking approximately 2-3 pills per day.  --continue Xtampza ER BID for long acting support.  --continue to take with Senokot to prevent opioid induced constipation. 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  --nausea as above   #Brain Metastasis, progressing --appreciate the assistance of Dr. 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 --radiation therapy to the brain completed on 11/16/2019.  --MRI brain on 04/29/2020 showed evidence of small brain bleed. Eliquis therapy was discontinued.  --MRI on 11/04/2020 showed concern for progression of intracranial disease.  --On 12/02/2020, patient underwent craniotomy, resection of progressive cerebellar mass by Dr. Vertell Limber. 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  --MRI on 12/23/2020 showed evidence of radiation necrosis. Dr. Mickeal Skinner evaluated the patient on 01/02/2021 with recommendations to add IV Bevacizumab 10 mg/kg q 3 weeks starting 01/26/2021. In addition, he recommended to decrease dexamethasone to 1 mg and discontinue after 7 days (01/09/2021).  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    # Hypertension: --BP at today's visit was 138/108. Currently on Norvasc 5 mg daily.  --Advised to check BP daily for the next 1-2 weeks to determine if we need to increase dose of Norvasc. Need to have good control of BP before starting Bevacizumab.   No orders of the defined types were placed in  this encounter.  All questions were answered. The patient knows to call the clinic with any problems, questions or concerns.  A total of more than 30 minutes were spent on this encounter and over half of that time was spent on counseling and coordination of care as outlined above.   [name]  [name] Department of Hematology/Oncology [facility] Phone: [phone]   01/05/2021 11:33 AM   Literature Support:  Lorenza Chick, Clearence Cheek, Duffy Rhody, Felip E, [place] F, Domine M, [place], Hochmair MJ, [place], [place], Bischoff HG, [place], Grossi F, [place], Reck M, [place], [place], [place], Rubio-Viqueira B, Novello S, Kurata T, Gray JE, Vida J, Wei Z, Yang J, Raftopoulos H, [place], [place] [employer]; [id number] Investigators. Pembrolizumab plus Chemotherapy in Metastatic Non-Small-Cell Lung Cancer. [employer] Med. 2018 May 31;378(22):2078-2092.  --Median progression-free survival was 8.8 months (95% CI, 7.6 to 9.2) in the pembrolizumab-combination group and 4.9 months (95% CI, 4.7 to 5.5) in the placebo-combination group (hazard ratio for disease progression or death, 0.52; 95% CI, 0.43 to 0.64; P<0.001). Adverse events of grade 3 or higher occurred in 67.2% of the patients in the pembrolizumab-combination group and in 65.8% of those in the placebo-combination group.  Jodene Nam L. Efficacy of pemetrexed-based regimens in advanced non-small cell lung cancer patients with activating epidermal growth factor receptor mutations after tyrosine kinase inhibitor failure: a systematic review. Onco Targets Ther. 2018;11:2121-2129.   --The weighted median PFS, median OS, and ORR for patients treated with pem regimens were 5.09 months, 15.91 months, and 30.19%, respectively. Our systematic review results showed a favorable efficacy profile of pem regimens in NSCLC patients with EGFR mutation after EGFR-TKI failure.   Patient was  seen with Dr. Xian Apostol Limbo.     ADDENDUM  .Patient was Personally and independently interviewed, examined and relevant elements of the history of present illness were reviewed in details and an assessment and plan was created. All elements of the patient's history of present illness , assessment and plan were discussed in details with [name] [name]. The above documentation reflects our combined findings assessment and plan. Patients labs stable and no prohibitive toxicities from continued Pemetrexate/Pembrolizumab maintenance. Chemotherapy orders reviewed and signed.  Avastin to be considered with next cycle if stable. On tapering dexamethasone with Dr Mickeal Skinner.  Sullivan Lone MD MS

## 2021-01-06 ENCOUNTER — Other Ambulatory Visit (HOSPITAL_COMMUNITY): Payer: Self-pay

## 2021-01-10 ENCOUNTER — Ambulatory Visit
Admission: RE | Admit: 2021-01-10 | Discharge: 2021-01-10 | Disposition: A | Discharge status: Home / Self Care | Payer: BC Managed Care – PPO | Source: Ambulatory Visit | Attending: Internal Medicine | Admitting: Internal Medicine

## 2021-01-10 ENCOUNTER — Other Ambulatory Visit: Payer: Self-pay

## 2021-01-10 DIAGNOSIS — Z1231 Encounter for screening mammogram for malignant neoplasm of breast: Secondary | ICD-10-CM

## 2021-01-13 ENCOUNTER — Telehealth: Payer: Self-pay | Admitting: *Deleted

## 2021-01-13 NOTE — Telephone Encounter (Signed)
Received call from pt's husband. He states that when Virginia [name] finished the tapering of her Decadron this week, she started getting very dizzy again and needed help to ambulate-holding on to her husband. They decided to go back on 1mg  of decadron and the dizzyness has gotten better. It is not totally gone but better. They are asking if she should stay on the 1 mg or go up to 2 mg or exactly what they should do.  Please advise

## 2021-01-16 ENCOUNTER — Telehealth: Payer: Self-pay | Admitting: *Deleted

## 2021-01-17 ENCOUNTER — Telehealth: Payer: Self-pay | Admitting: *Deleted

## 2021-01-17 ENCOUNTER — Other Ambulatory Visit (HOSPITAL_COMMUNITY): Payer: Self-pay

## 2021-01-17 ENCOUNTER — Other Ambulatory Visit: Payer: Self-pay | Admitting: Hematology and Oncology

## 2021-01-17 ENCOUNTER — Other Ambulatory Visit: Payer: Self-pay | Admitting: Internal Medicine

## 2021-01-17 MED ORDER — DEXAMETHASONE 1 MG PO TABS
2.0000 mg | ORAL_TABLET | Freq: Every day | ORAL | 1 refills | Status: DC
  Filled 2021-01-17: qty 60, 30d supply, fill #0
  Filled 2021-02-07: qty 60, 30d supply, fill #1

## 2021-01-17 MED ORDER — OXYCODONE HCL 5 MG PO TABS
ORAL_TABLET | ORAL | 0 refills | Status: DC
  Filled 2021-01-17: qty 90, 8d supply, fill #0

## 2021-01-17 MED ORDER — XTAMPZA ER 13.5 MG PO C12A
1.0000 | EXTENDED_RELEASE_CAPSULE | Freq: Two times a day (BID) | ORAL | 0 refills | Status: DC
  Filled 2021-01-17: qty 60, 30d supply, fill #0

## 2021-01-17 NOTE — Telephone Encounter (Signed)
Patients spouse call to ask for refill of the Oxycodone and Xtampza both to be refilled to [facility]  Routed to Dr Lorenso Courier.

## 2021-01-17 NOTE — Telephone Encounter (Signed)
Patients spouse called to advise that patient had completed her Decadron taper and was off completely for 1 day and developed dizziness and they resumed Decadron 1 mg daily.  Patient has been tolerating that but still has some episodes of dizziness becoming worse.  She is due to start Avastin on 01/26/2021.  Wanting to know if it was ok to continue Decadron until and if needed could they dose up to 2 mg.    Supply at home is sufficient for approximately 10 days.  Also wants to know if they HAVE to stop the Decadron before the Avastin or can it carry over for a couple of days.  Routed to Dr Mickeal Skinner to review.

## 2021-01-17 NOTE — Telephone Encounter (Signed)
Refills sent to [facility] as requested

## 2021-01-18 ENCOUNTER — Other Ambulatory Visit (HOSPITAL_COMMUNITY): Payer: Self-pay

## 2021-01-26 ENCOUNTER — Other Ambulatory Visit (HOSPITAL_COMMUNITY): Payer: Self-pay

## 2021-01-26 ENCOUNTER — Inpatient Hospital Stay: Payer: BC Managed Care – PPO

## 2021-01-26 ENCOUNTER — Inpatient Hospital Stay: Payer: BC Managed Care – PPO | Attending: Hematology and Oncology

## 2021-01-26 ENCOUNTER — Encounter: Payer: Self-pay | Admitting: Hematology and Oncology

## 2021-01-26 ENCOUNTER — Inpatient Hospital Stay (HOSPITAL_BASED_OUTPATIENT_CLINIC_OR_DEPARTMENT_OTHER): Payer: BC Managed Care – PPO | Admitting: Hematology and Oncology

## 2021-01-26 ENCOUNTER — Other Ambulatory Visit: Payer: BC Managed Care – PPO

## 2021-01-26 ENCOUNTER — Other Ambulatory Visit: Payer: Self-pay | Admitting: *Deleted

## 2021-01-26 ENCOUNTER — Other Ambulatory Visit: Payer: Self-pay

## 2021-01-26 VITALS — BP 145/103 | HR 101 | Temp 97.0°F | Resp 18 | Ht 62.0 in | Wt 152.3 lb

## 2021-01-26 VITALS — BP 124/91 | HR 98

## 2021-01-26 DIAGNOSIS — C7951 Secondary malignant neoplasm of bone: Secondary | ICD-10-CM

## 2021-01-26 DIAGNOSIS — C3490 Malignant neoplasm of unspecified part of unspecified bronchus or lung: Secondary | ICD-10-CM

## 2021-01-26 DIAGNOSIS — Z95828 Presence of other vascular implants and grafts: Secondary | ICD-10-CM | POA: Diagnosis not present

## 2021-01-26 DIAGNOSIS — Z923 Personal history of irradiation: Secondary | ICD-10-CM | POA: Diagnosis not present

## 2021-01-26 DIAGNOSIS — C3491 Malignant neoplasm of unspecified part of right bronchus or lung: Secondary | ICD-10-CM

## 2021-01-26 DIAGNOSIS — C7931 Secondary malignant neoplasm of brain: Secondary | ICD-10-CM

## 2021-01-26 DIAGNOSIS — Z79899 Other long term (current) drug therapy: Secondary | ICD-10-CM | POA: Insufficient documentation

## 2021-01-26 DIAGNOSIS — R918 Other nonspecific abnormal finding of lung field: Secondary | ICD-10-CM

## 2021-01-26 DIAGNOSIS — Z7952 Long term (current) use of systemic steroids: Secondary | ICD-10-CM | POA: Insufficient documentation

## 2021-01-26 DIAGNOSIS — I10 Essential (primary) hypertension: Secondary | ICD-10-CM | POA: Insufficient documentation

## 2021-01-26 DIAGNOSIS — Z5111 Encounter for antineoplastic chemotherapy: Secondary | ICD-10-CM | POA: Diagnosis not present

## 2021-01-26 DIAGNOSIS — Z5112 Encounter for antineoplastic immunotherapy: Secondary | ICD-10-CM | POA: Insufficient documentation

## 2021-01-26 DIAGNOSIS — R112 Nausea with vomiting, unspecified: Secondary | ICD-10-CM | POA: Insufficient documentation

## 2021-01-26 DIAGNOSIS — C3431 Malignant neoplasm of lower lobe, right bronchus or lung: Secondary | ICD-10-CM | POA: Insufficient documentation

## 2021-01-26 DIAGNOSIS — R42 Dizziness and giddiness: Secondary | ICD-10-CM | POA: Diagnosis not present

## 2021-01-26 DIAGNOSIS — I6789 Other cerebrovascular disease: Secondary | ICD-10-CM

## 2021-01-26 DIAGNOSIS — R0600 Dyspnea, unspecified: Secondary | ICD-10-CM | POA: Insufficient documentation

## 2021-01-26 DIAGNOSIS — Z9049 Acquired absence of other specified parts of digestive tract: Secondary | ICD-10-CM | POA: Diagnosis not present

## 2021-01-26 DIAGNOSIS — E876 Hypokalemia: Secondary | ICD-10-CM | POA: Insufficient documentation

## 2021-01-26 DIAGNOSIS — Y842 Radiological procedure and radiotherapy as the cause of abnormal reaction of the patient, or of later complication, without mention of misadventure at the time of the procedure: Secondary | ICD-10-CM

## 2021-01-26 LAB — CMP (CANCER CENTER ONLY)
ALT: 27 U/L (ref 0–44)
AST: 20 U/L (ref 15–41)
Albumin: 3.1 g/dL — ABNORMAL LOW (ref 3.5–5.0)
Alkaline Phosphatase: 55 U/L (ref 38–126)
Anion gap: 9 (ref 5–15)
BUN: 14 mg/dL (ref 8–23)
CO2: 29 mmol/L (ref 22–32)
Calcium: 8.9 mg/dL (ref 8.9–10.3)
Chloride: 105 mmol/L (ref 98–111)
Creatinine: 0.67 mg/dL (ref 0.44–1.00)
GFR, Estimated: >60 mL/min (ref >60)
Glucose, Bld: 119 mg/dL — ABNORMAL HIGH (ref 70–99)
Potassium: 3.8 mmol/L (ref 3.5–5.1)
Sodium: 143 mmol/L (ref 135–145)
Total Bilirubin: 0.4 mg/dL (ref 0.3–1.2)
Total Protein: 6.5 g/dL (ref 6.5–8.1)

## 2021-01-26 LAB — CBC WITH DIFFERENTIAL (CANCER CENTER ONLY)
Abs Immature Granulocytes: 0.14 10*3/uL — ABNORMAL HIGH (ref 0.00–0.07)
Basophils Absolute: 0.1 10*3/uL (ref 0.0–0.1)
Basophils Relative: 0 %
Eosinophils Absolute: 0 10*3/uL (ref 0.0–0.5)
Eosinophils Relative: 0 %
HCT: 36 % (ref 36.0–46.0)
Hemoglobin: 12.5 g/dL (ref 12.0–15.0)
Immature Granulocytes: 1 %
Lymphocytes Relative: 14 %
Lymphs Abs: 1.6 10*3/uL (ref 0.7–4.0)
MCH: 34.2 pg — ABNORMAL HIGH (ref 26.0–34.0)
MCHC: 34.7 g/dL (ref 30.0–36.0)
MCV: 98.6 fL (ref 80.0–100.0)
Monocytes Absolute: 1.1 10*3/uL — ABNORMAL HIGH (ref 0.1–1.0)
Monocytes Relative: 9 %
Neutro Abs: 8.7 10*3/uL — ABNORMAL HIGH (ref 1.7–7.7)
Neutrophils Relative %: 76 %
Platelet Count: 321 10*3/uL (ref 150–400)
RBC: 3.65 MIL/uL — ABNORMAL LOW (ref 3.87–5.11)
RDW: 14.6 % (ref 11.5–15.5)
WBC Count: 11.6 10*3/uL — ABNORMAL HIGH (ref 4.0–10.5)
nRBC: 0 % (ref 0.0–0.2)

## 2021-01-26 LAB — TOTAL PROTEIN, URINE DIPSTICK: Protein, ur: NEGATIVE mg/dL

## 2021-01-26 LAB — TSH: TSH: 1.611 u[IU]/mL (ref 0.308–3.960)

## 2021-01-26 MED ORDER — SODIUM CHLORIDE 0.9 % IV SOLN
500.0000 mg/m2 | Freq: Once | INTRAVENOUS | Status: AC
  Administered 2021-01-26: 800 mg via INTRAVENOUS
  Filled 2021-01-26: qty 20

## 2021-01-26 MED ORDER — SODIUM CHLORIDE 0.9% FLUSH
10.0000 mL | Freq: Once | INTRAVENOUS | Status: AC
Start: 2021-01-26 — End: 2021-01-26
  Administered 2021-01-26: 10 mL
  Filled 2021-01-26: qty 10

## 2021-01-26 MED ORDER — SODIUM CHLORIDE 0.9% FLUSH
3.0000 mL | Freq: Once | INTRAVENOUS | Status: DC | PRN
  Filled 2021-01-26: qty 10

## 2021-01-26 MED ORDER — ALTEPLASE 2 MG IJ SOLR
2.0000 mg | Freq: Once | INTRAMUSCULAR | Status: DC | PRN
  Filled 2021-01-26: qty 2

## 2021-01-26 MED ORDER — SODIUM CHLORIDE 0.9 % IV SOLN
10.0000 mg/kg | Freq: Once | INTRAVENOUS | Status: AC
  Administered 2021-01-26: 700 mg via INTRAVENOUS
  Filled 2021-01-26: qty 16

## 2021-01-26 MED ORDER — SODIUM CHLORIDE 0.9 % IV SOLN
200.0000 mg | Freq: Once | INTRAVENOUS | Status: AC
  Administered 2021-01-26: 200 mg via INTRAVENOUS
  Filled 2021-01-26: qty 8

## 2021-01-26 MED ORDER — SODIUM CHLORIDE 0.9% FLUSH
10.0000 mL | INTRAVENOUS | Status: DC | PRN
  Administered 2021-01-26: 10 mL
  Filled 2021-01-26: qty 10

## 2021-01-26 MED ORDER — PROCHLORPERAZINE MALEATE 10 MG PO TABS
10.0000 mg | ORAL_TABLET | Freq: Once | ORAL | Status: AC
Start: 2021-01-26 — End: 2021-01-26
  Administered 2021-01-26: 10 mg via ORAL

## 2021-01-26 MED ORDER — SODIUM CHLORIDE 0.9 % IV SOLN
Freq: Once | INTRAVENOUS | Status: DC
  Filled 2021-01-26: qty 250

## 2021-01-26 MED ORDER — SODIUM CHLORIDE 0.9 % IV SOLN
Freq: Once | INTRAVENOUS | Status: AC
  Filled 2021-01-26: qty 250

## 2021-01-26 MED ORDER — HEPARIN SOD (PORK) LOCK FLUSH 100 UNIT/ML IV SOLN
500.0000 [IU] | Freq: Once | INTRAVENOUS | Status: AC | PRN
  Administered 2021-01-26: 500 [IU]
  Filled 2021-01-26: qty 5

## 2021-01-26 MED ORDER — HEPARIN SOD (PORK) LOCK FLUSH 100 UNIT/ML IV SOLN
250.0000 [IU] | Freq: Once | INTRAVENOUS | Status: DC | PRN
  Filled 2021-01-26: qty 5

## 2021-01-26 MED ORDER — PROCHLORPERAZINE MALEATE 10 MG PO TABS
ORAL_TABLET | ORAL | Status: AC
  Filled 2021-01-26: qty 1

## 2021-01-26 NOTE — Progress Notes (Signed)
Per Dr. Lorenso Courier - okay to proceed without urine protein today. Dr. Lorenso Courier would like to collect sample during infusion appointment prior to completion

## 2021-01-26 NOTE — Patient Instructions (Signed)
[facility]  Discharge Instructions: Thank you for choosing [facility] to provide your oncology and hematology care.   If you have a lab appointment with the [facility], please go directly to the [facility] and check in at the registration area.   Wear comfortable clothing and clothing appropriate for easy access to any Portacath or PICC line.   We strive to give you quality time with your provider. You may need to reschedule your appointment if you arrive late (15 or more minutes).  Arriving late affects you and other patients whose appointments are after yours.  Also, if you miss three or more appointments without notifying the office, you may be dismissed from the clinic at the provider's discretion.      For prescription refill requests, have your pharmacy contact our office and allow 72 hours for refills to be completed.    Today you received the following chemotherapy and/or immunotherapy agents: Pembrolizumab (Keytruda), bevacizumab (Avastin) and Pemetrexed (Alimta)   To help prevent nausea and vomiting after your treatment, we encourage you to take your nausea medication as directed.  BELOW ARE SYMPTOMS THAT SHOULD BE REPORTED IMMEDIATELY: . *FEVER GREATER THAN 100.4 F (38 C) OR HIGHER . *CHILLS OR SWEATING . *NAUSEA AND VOMITING THAT IS NOT CONTROLLED WITH YOUR NAUSEA MEDICATION . *UNUSUAL SHORTNESS OF BREATH . *UNUSUAL BRUISING OR BLEEDING . *URINARY PROBLEMS (pain or burning when urinating, or frequent urination) . *BOWEL PROBLEMS (unusual diarrhea, constipation, pain near the anus) . TENDERNESS IN MOUTH AND THROAT WITH OR WITHOUT PRESENCE OF ULCERS (sore throat, sores in mouth, or a toothache) . UNUSUAL RASH, SWELLING OR PAIN  . UNUSUAL VAGINAL DISCHARGE OR ITCHING   Items with * indicate a potential emergency and should be followed up as soon as possible or go to the Emergency Department if any problems should  occur.  Please show the CHEMOTHERAPY ALERT CARD or IMMUNOTHERAPY ALERT CARD at check-in to the Emergency Department and triage nurse.  Should you have questions after your visit or need to cancel or reschedule your appointment, please contact [facility]  Dept: [phone]  and follow the prompts.  Office hours are 8:00 a.m. to 4:30 p.m. Monday - Friday. Please note that voicemails left after 4:00 p.m. may not be returned until the following business day.  We are closed weekends and major holidays. You have access to a nurse at all times for urgent questions. Please call the main number to the clinic Dept: [phone] and follow the prompts.   For any non-urgent questions, you may also contact your provider using MyChart. We now offer e-Visits for anyone 54 and older to request care online for non-urgent symptoms. For details visit [email or web address].   Also download the MyChart app! Go to the app store, search "MyChart", open the app, select [facility], and log in with your MyChart username and password.  Due to Covid, a mask is required upon entering the hospital/clinic. If you do not have a mask, one will be given to you upon arrival. For doctor visits, patients may have 1 support person aged 63 or older with them. For treatment visits, patients cannot have anyone with them due to current Covid guidelines and our immunocompromised population.   Bevacizumab injection What is this medicine? BEVACIZUMAB (be va SIZ yoo mab) is a monoclonal antibody. It is used to treat many types of cancer. This medicine may be used for other purposes; ask your health  care provider or pharmacist if you have questions. COMMON BRAND NAME(S): Avastin, MVASI, Zirabev What should I tell my health care provider before I take this medicine? They need to know if you have any of these conditions:  diabetes  heart disease  high blood pressure  history of coughing up  blood  prior anthracycline chemotherapy (e.g., doxorubicin, daunorubicin, epirubicin)  recent or ongoing radiation therapy  recent or planning to have surgery  stroke  an unusual or allergic reaction to bevacizumab, hamster proteins, mouse proteins, other medicines, foods, dyes, or preservatives  pregnant or trying to get pregnant  breast-feeding How should I use this medicine? This medicine is for infusion into a vein. It is given by a health care professional in a hospital or clinic setting. Talk to your pediatrician regarding the use of this medicine in children. Special care may be needed. Overdosage: If you think you have taken too much of this medicine contact a poison control center or emergency room at once. NOTE: This medicine is only for you. Do not share this medicine with others. What if I miss a dose? It is important not to miss your dose. Call your doctor or health care professional if you are unable to keep an appointment. What may interact with this medicine? Interactions are not expected. This list may not describe all possible interactions. Give your health care provider a list of all the medicines, herbs, non-prescription drugs, or dietary supplements you use. Also tell them if you smoke, drink alcohol, or use illegal drugs. Some items may interact with your medicine. What should I watch for while using this medicine? Your condition will be monitored carefully while you are receiving this medicine. You will need important blood work and urine testing done while you are taking this medicine. This medicine may increase your risk to bruise or bleed. Call your doctor or health care professional if you notice any unusual bleeding. Before having surgery, talk to your health care provider to make sure it is ok. This drug can increase the risk of poor healing of your surgical site or wound. You will need to stop this drug for 28 days before surgery. After surgery, wait at least  28 days before restarting this drug. Make sure the surgical site or wound is healed enough before restarting this drug. Talk to your health care provider if questions. Do not become pregnant while taking this medicine or for 6 months after stopping it. Women should inform their doctor if they wish to become pregnant or think they might be pregnant. There is a potential for serious side effects to an unborn child. Talk to your health care professional or pharmacist for more information. Do not breast-feed an infant while taking this medicine and for 6 months after the last dose. This medicine has caused ovarian failure in some women. This medicine may interfere with the ability to have a child. You should talk to your doctor or health care professional if you are concerned about your fertility. What side effects may I notice from receiving this medicine? Side effects that you should report to your doctor or health care professional as soon as possible:  allergic reactions like skin rash, itching or hives, swelling of the face, lips, or tongue  chest pain or chest tightness  chills  coughing up blood  high fever  seizures  severe constipation  signs and symptoms of bleeding such as bloody or black, tarry stools; red or dark-brown urine; spitting up blood or brown  material that looks like coffee grounds; red spots on the skin; unusual bruising or bleeding from the eye, gums, or nose  signs and symptoms of a blood clot such as breathing problems; chest pain; severe, sudden headache; pain, swelling, warmth in the leg  signs and symptoms of a stroke like changes in vision; confusion; trouble speaking or understanding; severe headaches; sudden numbness or weakness of the face, arm or leg; trouble walking; dizziness; loss of balance or coordination  stomach pain  sweating  swelling of legs or ankles  vomiting  weight gain Side effects that usually do not require medical attention (report  to your doctor or health care professional if they continue or are bothersome):  back pain  changes in taste  decreased appetite  dry skin  nausea  tiredness This list may not describe all possible side effects. Call your doctor for medical advice about side effects. You may report side effects to FDA at 1-800-FDA-1088. Where should I keep my medicine? This drug is given in a hospital or clinic and will not be stored at home. NOTE: This sheet is a summary. It may not cover all possible information. If you have questions about this medicine, talk to your doctor, pharmacist, or health care provider.  2021 Elsevier/Gold Standard (2019-06-24 10:50:46)

## 2021-01-26 NOTE — Progress Notes (Signed)
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 CT of the head showed showed concerning for metastatic disease involving the left cerebellum and right occipital lobe.  2) 09/30/2019: MRI brain confirms mass within the left cerebellum measures 1.6 x 1.3 x 1.5 cm as well as at least 8 metastatic lesions within the supratentorial and infratentorial brain as outlined 3) 10/01/2019: PET CT scan revealed hypermetabolic infrahilar right lower lobe mass with hypermetabolic nodal metastases in the right hilum, mediastinum, right supraclavicular region, left axilla and lower left neck. 4) 10/08/2019: US guided biopsy of the lymph nodes confirms poorly differentiated non-small cell carcinoma. Immunohistochemistry confirms adenocarcinoma of lung primary. PD-L1 and NGS later revealed a MET Exon 14 mutation and TPS of 90%.  5) 10/12/2019: Establish care with Dr. Lorenso Courier  6) 11/04/2019: Day 1 of capmatinib $RemoveBefor'400mg'qhtOtmTVLgNa$  BID 7) 11/09/2019: presented to Rad/Onc with a DVT in LLE. Seen in symptom management clinic and started on apixaban.  8) 12/29/2019: CT C/A/P showed interval decrease in size of mass involving the superior segment of right lower lobe and reduction in mediastinal and left supraclavicular adenopathy though some small spinal lesions were noted in the interim between the two sets of scans 9) 01/25/2020: temporarily stopped capmatinib due to symptoms of nausea/fatigue. Restarted therapy on 01/30/2020 after brief chemo holiday.  10) 03/22/2020: CT C/A/P reveals a mixed picture, with new lung nodule and  increased lymphadenopathy, however response in bone lesions. D/c capmatinib therapy 11) 04/08/2020: start of Carbo/Pem/Pem for 2nd line treatment. Cycle 1 Day 1   12) 04/29/2020: Cycle 2 Day 1 Carbo/Pem/Pem  13) 05/20/2020: Cycle 3 Day 1 Carbo/Pem/Pem  14) 06/09/2020: Cycle 4 Day 1 Carbo/Pem/Pem  15) 06/29/2020: Cycle 5 Day 1 maintenance Pem/Pem  16) 07/20/2020: Cycle 6 Day 1 maintenance Pem/Pem  17) 08/12/2020: Cycle 7 Day 1 maintenance Pem/Pem  18) 09/01/2020: Cycle 8 Day 1 maintenance Pem/Pem  19) 09/21/2020: Cycle 9 Day 1 maintenance Pem/Pem  20) 10/13/2020: Cycle 10 Day 1 maintenance Pem/Pem  21) 11/03/2020: Cycle 11 Day 1 maintenance Pem/Pem  22) 11/25/2020:  Cycle 12 Day 1 maintenance Pem/Pem 23) 12/15/2020:  Cycle 13 Day 1 maintenance Pem/Pem 24) 01/05/2021:  Cycle 14 Day 1 maintenance Pem/Pem 25) 01/26/2021:  Cycle 15 Day 1 maintenance Pem/Pem plus Becacizumab.   Interval History:  Virginia [name] 65 y.o. female with medical history significant for metastatic adenocarcinoma of the lung presents for a follow up visit. The patient's last visit was on 01/05/2021 prior to the start of Cycle 14 Day 1 of Pem/Pem. In the interim since the last visit, she has continued on dexamethasone 2 mg p.o. daily as she try to decrease her dyspnea is worsened.  Today is Cycle 15 Day 1.   On exam today [name] is accompanied by her husband.  She reports that she has been well overall in the interim since her last visit.  She unfortunately has continued to have some dizziness and feeling off balance.  She often uses a railing her husband support to keep her upright.  She is otherwise tolerating therapy quite well with no other focal symptoms.  She denies any  fevers, chills, shortness of breath, chest pain, cough, neuropathy or skin changes. She has no other complaints.  A full 10 point ROS is listed below.   MEDICAL HISTORY:  Past Medical History:  Diagnosis Date  . Anemia   . Anxiety   . Concussion  09/28/2019  . DVT (deep venous thrombosis) ([address]) 2021   L leg  . Dyspnea   . GERD (gastroesophageal reflux disease)   . Hypercholesterolemia    per pt, she does not have elevated lipids  . Hypertension   . met lung ca dx'd 09/2019   mets to spine, hip and brain  . PONV (postoperative nausea and vomiting)   . Tobacco abuse     SURGICAL HISTORY: Past Surgical History:  Procedure Laterality Date  . ABDOMINAL HYSTERECTOMY     partial/ left ovaries  . APPLICATION OF CRANIAL NAVIGATION N/A 12/02/2020   Procedure: APPLICATION OF CRANIAL NAVIGATION;  Surgeon: Erline Levine, MD;  Location: [place];  Service: Neurosurgery;  Laterality: N/A;  . CHOLECYSTECTOMY    . CRANIOTOMY N/A 12/02/2020   Procedure: Posterior fossa craniotomy for tumor resection with brainlab;  Surgeon: Erline Levine, MD;  Location: [place];  Service: Neurosurgery;  Laterality: N/A;  . DILATION AND CURETTAGE OF UTERUS    . IR IMAGING GUIDED PORT INSERTION  10/23/2019  . KYPHOPLASTY N/A 03/15/2020   Procedure: Thoracic Eight KYPHOPLASTY;  Surgeon: Erline Levine, MD;  Location: [place];  Service: Neurosurgery;  Laterality: N/A;  prone   . LIPOMA EXCISION  2018   removed under left breast and right thigh.  . TUBAL LIGATION      ALLERGIES:  has No Known Allergies.  MEDICATIONS:  Current Outpatient Medications  Medication Sig Dispense Refill  . acetaminophen (TYLENOL) 500 MG tablet Take 1,000 mg by mouth every 8 (eight) hours as needed for moderate pain.    Marland Kitchen albuterol (VENTOLIN HFA) 108 (90 Base) MCG/ACT inhaler INHALE 2 PUFFS INTO THE LUNGS EVERY 6 HOURS AS NEEDED FOR WHEEZING OR SHORTNESS OF BREATH (Patient taking differently: Inhale 2 puffs into the lungs every 6 (six) hours as needed for shortness of breath or wheezing.) 6.7 g 11  . ALPRAZolam (XANAX) 0.25 MG tablet Take 1 tablet (0.25 mg total) by mouth 2 (two) times daily as needed. for anxiety (Patient taking differently: Take 0.25 mg by mouth 2 (two) times daily as needed  for anxiety.) 60 tablet 5  . amLODipine (NORVASC) 5 MG tablet TAKE 1 TABLET BY MOUTH ONCE A DAY 30 tablet 3  . Cyanocobalamin (VITAMIN B12 PO) Take 2 tablets by mouth daily. Gummies    . dexamethasone (DECADRON) 1 MG tablet Take 2 tablets (2 mg total) by mouth daily. 60 tablet 1  . folic acid (FOLVITE) 1 MG tablet TAKE 1 TABLET BY MOUTH DAILY. (Patient taking differently: Take 1 mg by mouth daily.) 90 tablet 1  . lidocaine-prilocaine (EMLA) cream Apply 1 application topically as needed. Apply to port as needed. 30 g 0  . Multiple Vitamins-Minerals (AIRBORNE PO) Take 1 tablet by mouth daily.     Marland Kitchen ofloxacin (OCUFLOX) 0.3 % ophthalmic solution 2 drops each eye 4 times a day for 5 days (Patient taking differently: Place 2 drops into both eyes daily as needed (irritation).) 5 mL 1  . OLANZapine (ZYPREXA) 2.5 MG tablet TAKE 1 TABLET BY MOUTH EVERY NIGHT AT BEDTIME 90 tablet 1  . ondansetron (ZOFRAN) 4 MG tablet TAKE 1 TO 2 TABLETS BY MOUTH 2 TIMES DAILY AS NEEDED (Patient not  taking: Reported on 01/02/2021) 60 tablet 0  . oxyCODONE (OXY IR/ROXICODONE) 5 MG immediate release tablet TAKE 1-2 TABLETS BY MOUTH EVERY 4 HOURS AS NEEDED FOR SEVERE PAIN 90 tablet 0  . pantoprazole (PROTONIX) 40 MG tablet TAKE ONE TABLET BY MOUTH DAILY (Patient taking differently: Take 40 mg by mouth daily.) 90 tablet 3  . pentoxifylline (TRENTAL) 400 MG CR tablet TAKE 1 TABLET BY MOUTH WITH FOOD FOR 1 WEEK THEN TAKE 1 TABLET BY MOUTH 2 TIMES DAILY THEREAFTER 60 tablet 2  . polyethylene glycol (MIRALAX / GLYCOLAX) 17 g packet Take 17 g by mouth 2 (two) times daily.     . promethazine (PHENERGAN) 12.5 MG tablet TAKE 1 TO 2 TABLETS BY MOUTH EVERY 4 HOURS AS NEEDED FOR REFRACTORY NAUSEA OR VOMITING 30 tablet 0  . Vitamin D3 (VITAMIN D) 25 MCG tablet Take 2,000 Units by mouth daily.    . vitamin E 180 MG (400 UNITS) capsule Take 400 IU daily x 1 week, then 400 IU BID (Patient taking differently: Take 400 Units by mouth in the morning  and at bedtime. Take 400 IU daily x 1 week, then 400 IU BID) 60 capsule 4  . XTAMPZA ER 13.5 MG C12A Take 1 capsule by mouth in the morning and at bedtime. (every 12 hours) 60 capsule 0   No current facility-administered medications for this visit.   Facility-Administered Medications Ordered in Other Visits  Medication Dose Route Frequency Provider Last Rate Last Admin  . sodium chloride flush (NS) 0.9 % injection 10 mL  10 mL Intracatheter PRN Orson Slick, MD   10 mL at 01/26/21 1453    REVIEW OF SYSTEMS:   Constitutional: ( - ) fevers, ( - )  chills , ( - ) night sweats Eyes: ( - ) blurriness of vision, ( - ) double vision, ( - ) watery eyes Ears, nose, mouth, throat, and face: ( - ) mucositis, ( - ) sore throat Respiratory: ( - ) cough, ( - ) dyspnea, ( - ) wheezes Cardiovascular: ( - ) palpitation, ( - ) chest discomfort, ( - ) lower extremity swelling Gastrointestinal:  ( - ) nausea, ( - ) heartburn, ( - ) change in bowel habits Skin: ( - ) abnormal skin rashes Lymphatics: ( - ) new lymphadenopathy, ( - ) easy bruising Neurological: ( - ) numbness, ( - ) tingling, ( - ) new weaknesses Behavioral/Psych: ( - ) mood change, ( - ) new changes  All other systems were reviewed with the patient and are negative.  PHYSICAL EXAMINATION: ECOG PERFORMANCE STATUS: 1 - Symptomatic but completely ambulatory  Vitals:   01/26/21 1030  BP: (!) 145/103  Pulse: (!) 101  Resp: 18  Temp: (!) 97 F (36.1 C)  SpO2: 98%   Filed Weights   01/26/21 1030  Weight: 152 lb 4.8 oz (69.1 kg)    GENERAL: well appearing middle aged Caucasian female in NAD  SKIN: skin color, texture, turgor are normal, no rashes or significant lesions EYES: conjunctiva are pink and non-injected, sclera clear LUNGS: clear to auscultation and percussion with normal breathing effort HEART: regular rate & rhythm and no murmurs and bilateral + trace pitting lower extremity edema Musculoskeletal: no cyanosis of digits  and no clubbing PSYCH: alert & oriented x 3, fluent speech NEURO: no focal motor/sensory deficits   LABORATORY DATA:  I have reviewed the data as listed CBC Latest Ref Rng & Units 01/26/2021 01/05/2021 12/15/2020  WBC 4.0 -  10.5 K/uL 11.6(H) 9.9 14.7(H)  Hemoglobin 12.0 - 15.0 g/dL 12.5 11.8(L) 11.6(L)  Hematocrit 36.0 - 46.0 % 36.0 33.6(L) 33.3(L)  Platelets 150 - 400 K/uL 321 270 279    CMP Latest Ref Rng & Units 01/26/2021 01/05/2021 12/15/2020  Glucose 70 - 99 mg/dL 119(H) 121(H) 111(H)  BUN 8 - 23 mg/dL $Remove'14 11 15  'YKoNnQN$ Creatinine 0.44 - 1.00 mg/dL 0.67 0.79 0.75  Sodium 135 - 145 mmol/L 143 140 140  Potassium 3.5 - 5.1 mmol/L 3.8 4.1 4.1  Chloride 98 - 111 mmol/L 105 103 105  CO2 22 - 32 mmol/L $RemoveB'29 29 23  'wvukKWIN$ Calcium 8.9 - 10.3 mg/dL 8.9 8.5(L) 8.3(L)  Total Protein 6.5 - 8.1 g/dL 6.5 6.4(L) 6.3(L)  Total Bilirubin 0.3 - 1.2 mg/dL 0.4 0.3 0.2(L)  Alkaline Phos 38 - 126 U/L 55 55 46  AST 15 - 41 U/L $Remo'20 18 26  'cEgiy$ ALT 0 - 44 U/L 27 31 54(H)     RADIOGRAPHIC STUDIES: I personally have viewed the radiographic studies below: MM 3D SCREEN BREAST BILATERAL  Result Date: 01/10/2021 CLINICAL DATA:  Screening. EXAM: DIGITAL SCREENING BILATERAL MAMMOGRAM WITH TOMOSYNTHESIS AND CAD TECHNIQUE: Bilateral screening digital craniocaudal and mediolateral oblique mammograms were obtained. Bilateral screening digital breast tomosynthesis was performed. The images were evaluated with computer-aided detection. COMPARISON:  Previous exam(s). ACR Breast Density Category b: There are scattered areas of fibroglandular density. FINDINGS: There are no findings suspicious for malignancy. The images were evaluated with computer-aided detection. IMPRESSION: No mammographic evidence of malignancy. A result letter of this screening mammogram will be mailed directly to the patient. RECOMMENDATION: Screening mammogram in one year. (Code:SM-B-01Y) BI-RADS CATEGORY  1: Negative. Electronically Signed   By: Marin Olp M.D.   On:  01/10/2021 13:56    ASSESSMENT & PLAN Virginia [name] 65 y.o. female with medical history significant for metastatic adenocarcinoma of the lung presents for a follow up visit. 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   Previously we discussed Carboplatin/Pembrolizumab/Pemetrexed and the expected side effect from these medications.  We discussed the possible side effects of immunotherapy including colitis, hepatitis, dermatitis, and pneumonitis.  Additionally we discussed the side effects of carboplatin which include suppression of blood counts, nephrotoxicity, and nausea.  Additionally we discussed pemetrexed which can also have effects on counts and would require supplementation with vitamin K81 and folic acid.  She voiced her understanding of these side effects and treatment plans moving forward.  I also noted that we would be able to drop the chemotherapy agents that she had intolerance to continue with pembrolizumab alone given her high TPS score.  The patient has a markedly elevated TPS score (90%)   Ms. Lad continues to tolerate the treatment. Her labs from today were reviewed without any intervention needed. Patient will proceed with treatment as planned and return to the clinic in 3 weeks prior to C16.   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 --patient will be followed every 3 weeks with labs and clinic visit. --port in place, patient completed chemotherapy education.  --plan for repeat CT C/A/P in July 2022 (q 3 months) --supportive therapy as listed below.  --RTC q 3 weeks for continued monitoring/chemotherapy treatments.    #Nausea/Vomiting -- provided  patient with Zofran 4-8mg  q8h PRN and compazine 10mg  q6H for breakthrough PRN. These  have not been required since olanzapine was added --continue olanzpaine 2.5mg  PO QHS to help with nausea. Marked improvement since the addition of this medication on 01/29/2020.   --continue to monitor   #Hypokalemia, stable --likely 2/2 to decreased PO intake, hypovolemia, and BP medications --Potassium level is within normal limits today.  --continue to monitor  #Pain Control, stable --continue oxycodone 5mg  q4H PRN. Can increase dose to 10mg  q6H if pain is severe. She is taking approximately 2-3 pills per day.  --continue Xtampza ER BID for long acting support.  --continue to take with Senokot to prevent opioid induced constipation. 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  --nausea as above   #Brain Metastasis, progressing --appreciate the assistance of Dr. 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 --radiation therapy to the brain completed on 11/16/2019.  --MRI brain on 04/29/2020 showed evidence of small brain bleed. Eliquis therapy was discontinued.  --MRI on 11/04/2020 showed concern for progression of intracranial disease.  --On 12/02/2020, patient underwent craniotomy, resection of progressive cerebellar mass by Dr. Vertell Limber. 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  --MRI on 12/23/2020 showed evidence of radiation necrosis. Dr. Mickeal Skinner evaluated the patient on 01/02/2021 with recommendations to add IV Bevacizumab 10 mg/kg q 3 weeks starting today (01/26/2021). In addition, he recommended to decrease dexamethasone to 1 mg and discontinue after 1 week.  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    # Hypertension: --BP at today's visit was 145/103. Increase Norvasc to 10 mg daily.  --continue to monitor at home and in clinic.   No orders of the defined  types were placed in this encounter.  All questions were answered. The patient knows to call the clinic with any problems, questions or concerns.  A total of more than 30 minutes were spent on this encounter and over half of that time was spent on counseling and coordination of care as outlined above.   Ledell Peoples, MD Department of Hematology/Oncology [place] at [facility] Phone: [phone] Pager: [phone] Email: [email or web address]   01/26/2021 4:19 PM   Literature Support:  Lorenza Chick, Rodrguez-Abreu D, Duffy Rhody, Felip E, De Angelis F, Domine M, [place], Hochmair MJ, [place], [place], Bischoff HG, [place], Grossi F, [place], Reck M, [place], [place], [place], Rubio-Viqueira B, Novello S, Kurata T, Gray JE, Vida J, Wei Z, Yang J, Raftopoulos H, [place], [place] [employer]; [id number] Investigators. Pembrolizumab plus Chemotherapy in Metastatic Non-Small-Cell Lung Cancer. [place] Med. 2018 May 31;378(22):2078-2092.  --Median progression-free survival was 8.8 months (95% CI, 7.6 to 9.2) in the pembrolizumab-combination group and 4.9 months (95% CI, 4.7 to 5.5) in the placebo-combination group (hazard ratio for disease progression or death, 0.52; 95% CI, 0.43 to 0.64; P<0.001). Adverse events of grade 3 or higher occurred in 67.2% of the patients in the pembrolizumab-combination group and in 65.8% of those in the placebo-combination group.  Jodene Nam L. Efficacy of pemetrexed-based regimens in advanced non-small cell lung cancer patients with activating epidermal growth factor receptor mutations after tyrosine kinase inhibitor failure: a systematic review. Onco Targets Ther. 2018;11:2121-2129.   --The weighted median PFS, median  OS, and ORR for patients treated with pem regimens were 5.09 months, 15.91 months, and 30.19%, respectively. Our systematic review results showed a favorable efficacy profile of pem regimens  in NSCLC patients with EGFR mutation after EGFR-TKI failure.

## 2021-01-27 ENCOUNTER — Telehealth: Payer: Self-pay | Admitting: *Deleted

## 2021-01-27 ENCOUNTER — Other Ambulatory Visit (HOSPITAL_COMMUNITY): Payer: Self-pay

## 2021-01-31 ENCOUNTER — Telehealth: Payer: Self-pay | Admitting: Hematology and Oncology

## 2021-01-31 NOTE — Telephone Encounter (Signed)
Scheduled per los. Called and left msg. Mailed printout  °

## 2021-02-02 ENCOUNTER — Telehealth: Payer: Self-pay | Admitting: Internal Medicine

## 2021-02-02 DIAGNOSIS — M546 Pain in thoracic spine: Secondary | ICD-10-CM | POA: Diagnosis not present

## 2021-02-02 DIAGNOSIS — C7931 Secondary malignant neoplasm of brain: Secondary | ICD-10-CM | POA: Diagnosis not present

## 2021-02-02 DIAGNOSIS — D496 Neoplasm of unspecified behavior of brain: Secondary | ICD-10-CM | POA: Diagnosis not present

## 2021-02-02 NOTE — Telephone Encounter (Signed)
Faxed Medical Records to [facility] [phone],  Case # [id number]

## 2021-02-07 ENCOUNTER — Other Ambulatory Visit (HOSPITAL_COMMUNITY): Payer: Self-pay

## 2021-02-07 ENCOUNTER — Telehealth: Payer: Self-pay | Admitting: *Deleted

## 2021-02-07 NOTE — Telephone Encounter (Signed)
Patients spouse called to report that she had stopped her Decadron 1 mg on 02/02/2021 (Thursday) and starting Sunday her dizziness became worse.  They restarted Decadron 1mg  and some of that dizziness has resolved.  They have prescription on file for refills.    Routed to Dr Mickeal Skinner to advise.  She isn't scheduled to see Dr Mickeal Skinner until July 14th.  If follow up for steroid mgmt is needed prior to then please let me know.

## 2021-02-09 ENCOUNTER — Other Ambulatory Visit (HOSPITAL_COMMUNITY): Payer: Self-pay

## 2021-02-09 ENCOUNTER — Other Ambulatory Visit: Payer: Self-pay | Admitting: *Deleted

## 2021-02-09 ENCOUNTER — Other Ambulatory Visit: Payer: Self-pay | Admitting: Hematology and Oncology

## 2021-02-09 MED ORDER — AMLODIPINE BESYLATE 10 MG PO TABS
10.0000 mg | ORAL_TABLET | Freq: Every day | ORAL | 3 refills | Status: DC
Start: 2021-02-09 — End: 2021-02-10
  Filled 2021-02-09: qty 1, 1d supply, fill #0

## 2021-02-10 ENCOUNTER — Other Ambulatory Visit: Payer: Self-pay | Admitting: *Deleted

## 2021-02-10 ENCOUNTER — Other Ambulatory Visit (HOSPITAL_COMMUNITY): Payer: Self-pay

## 2021-02-10 ENCOUNTER — Encounter: Payer: Self-pay | Admitting: *Deleted

## 2021-02-10 MED ORDER — AMLODIPINE BESYLATE 10 MG PO TABS
10.0000 mg | ORAL_TABLET | Freq: Every day | ORAL | 3 refills | Status: DC
  Filled 2021-02-10: qty 30, 30d supply, fill #0
  Filled 2021-03-10: qty 30, 30d supply, fill #1
  Filled 2021-03-14 – 2021-04-03 (×2): qty 30, 30d supply, fill #2
  Filled 2021-05-03: qty 30, 30d supply, fill #3

## 2021-02-13 ENCOUNTER — Telehealth: Payer: Self-pay | Admitting: *Deleted

## 2021-02-13 ENCOUNTER — Other Ambulatory Visit: Payer: Self-pay

## 2021-02-13 ENCOUNTER — Other Ambulatory Visit (HOSPITAL_COMMUNITY): Payer: Self-pay

## 2021-02-13 ENCOUNTER — Other Ambulatory Visit: Payer: Self-pay | Admitting: Hematology and Oncology

## 2021-02-13 MED ORDER — XTAMPZA ER 13.5 MG PO C12A
1.0000 | EXTENDED_RELEASE_CAPSULE | Freq: Two times a day (BID) | ORAL | 0 refills | Status: DC
  Filled 2021-02-13 – 2021-02-15 (×2): qty 60, 30d supply, fill #0

## 2021-02-13 MED ORDER — OXYCODONE HCL 5 MG PO TABS
ORAL_TABLET | ORAL | 0 refills | Status: DC
  Filled 2021-02-13: qty 90, 15d supply, fill #0

## 2021-02-13 NOTE — Telephone Encounter (Signed)
Received call from pt's hisband, Randy. He is requesting refill on pt's pain meds : Xtampza 13.5 mg and oxycodone 5 mg. Both of these were last filled on 01/17/21 @ [facility]

## 2021-02-13 NOTE — Telephone Encounter (Signed)
Need refill sent to [facility]. Patient's husband stated she was going to be out by the end of the week.

## 2021-02-15 ENCOUNTER — Other Ambulatory Visit (HOSPITAL_COMMUNITY): Payer: Self-pay

## 2021-02-16 ENCOUNTER — Other Ambulatory Visit: Payer: Self-pay | Admitting: Hematology and Oncology

## 2021-02-16 ENCOUNTER — Other Ambulatory Visit (HOSPITAL_COMMUNITY): Payer: Self-pay

## 2021-02-16 MED ORDER — ALPRAZOLAM 0.25 MG PO TABS
0.2500 mg | ORAL_TABLET | Freq: Two times a day (BID) | ORAL | 5 refills | Status: DC | PRN
  Filled 2021-02-16: qty 60, 30d supply, fill #0

## 2021-02-16 MED ORDER — OLANZAPINE 2.5 MG PO TABS
ORAL_TABLET | Freq: Every day | ORAL | 1 refills | Status: DC
  Filled 2021-02-16: qty 90, 90d supply, fill #0
  Filled 2021-05-11: qty 90, 90d supply, fill #1

## 2021-02-16 NOTE — Telephone Encounter (Signed)
Please sign

## 2021-02-17 ENCOUNTER — Inpatient Hospital Stay: Payer: BC Managed Care – PPO

## 2021-02-17 ENCOUNTER — Inpatient Hospital Stay (HOSPITAL_BASED_OUTPATIENT_CLINIC_OR_DEPARTMENT_OTHER): Payer: BC Managed Care – PPO | Admitting: Hematology and Oncology

## 2021-02-17 ENCOUNTER — Encounter: Payer: Self-pay | Admitting: Internal Medicine

## 2021-02-17 ENCOUNTER — Other Ambulatory Visit: Payer: BC Managed Care – PPO

## 2021-02-17 ENCOUNTER — Encounter: Payer: Self-pay | Admitting: Hematology and Oncology

## 2021-02-17 ENCOUNTER — Other Ambulatory Visit: Payer: Self-pay

## 2021-02-17 ENCOUNTER — Other Ambulatory Visit (HOSPITAL_COMMUNITY): Payer: Self-pay

## 2021-02-17 ENCOUNTER — Inpatient Hospital Stay: Payer: BC Managed Care – PPO | Attending: Hematology and Oncology

## 2021-02-17 VITALS — BP 131/99 | HR 95

## 2021-02-17 VITALS — BP 145/110 | HR 110 | Temp 97.7°F | Resp 19 | Wt 156.8 lb

## 2021-02-17 DIAGNOSIS — Z923 Personal history of irradiation: Secondary | ICD-10-CM | POA: Diagnosis not present

## 2021-02-17 DIAGNOSIS — I10 Essential (primary) hypertension: Secondary | ICD-10-CM | POA: Insufficient documentation

## 2021-02-17 DIAGNOSIS — R42 Dizziness and giddiness: Secondary | ICD-10-CM | POA: Diagnosis not present

## 2021-02-17 DIAGNOSIS — Z7952 Long term (current) use of systemic steroids: Secondary | ICD-10-CM | POA: Insufficient documentation

## 2021-02-17 DIAGNOSIS — Z9049 Acquired absence of other specified parts of digestive tract: Secondary | ICD-10-CM | POA: Diagnosis not present

## 2021-02-17 DIAGNOSIS — Z5111 Encounter for antineoplastic chemotherapy: Secondary | ICD-10-CM | POA: Diagnosis not present

## 2021-02-17 DIAGNOSIS — I6789 Other cerebrovascular disease: Secondary | ICD-10-CM

## 2021-02-17 DIAGNOSIS — R918 Other nonspecific abnormal finding of lung field: Secondary | ICD-10-CM

## 2021-02-17 DIAGNOSIS — Z95828 Presence of other vascular implants and grafts: Secondary | ICD-10-CM

## 2021-02-17 DIAGNOSIS — C3431 Malignant neoplasm of lower lobe, right bronchus or lung: Secondary | ICD-10-CM | POA: Diagnosis not present

## 2021-02-17 DIAGNOSIS — E876 Hypokalemia: Secondary | ICD-10-CM | POA: Insufficient documentation

## 2021-02-17 DIAGNOSIS — C3490 Malignant neoplasm of unspecified part of unspecified bronchus or lung: Secondary | ICD-10-CM

## 2021-02-17 DIAGNOSIS — C7931 Secondary malignant neoplasm of brain: Secondary | ICD-10-CM | POA: Diagnosis not present

## 2021-02-17 DIAGNOSIS — Z86718 Personal history of other venous thrombosis and embolism: Secondary | ICD-10-CM | POA: Insufficient documentation

## 2021-02-17 DIAGNOSIS — R269 Unspecified abnormalities of gait and mobility: Secondary | ICD-10-CM | POA: Insufficient documentation

## 2021-02-17 DIAGNOSIS — C7951 Secondary malignant neoplasm of bone: Secondary | ICD-10-CM

## 2021-02-17 DIAGNOSIS — R600 Localized edema: Secondary | ICD-10-CM | POA: Diagnosis not present

## 2021-02-17 DIAGNOSIS — R059 Cough, unspecified: Secondary | ICD-10-CM | POA: Diagnosis not present

## 2021-02-17 DIAGNOSIS — R112 Nausea with vomiting, unspecified: Secondary | ICD-10-CM | POA: Insufficient documentation

## 2021-02-17 DIAGNOSIS — Z5112 Encounter for antineoplastic immunotherapy: Secondary | ICD-10-CM | POA: Diagnosis not present

## 2021-02-17 DIAGNOSIS — Z79899 Other long term (current) drug therapy: Secondary | ICD-10-CM | POA: Insufficient documentation

## 2021-02-17 LAB — CBC WITH DIFFERENTIAL (CANCER CENTER ONLY)
Abs Immature Granulocytes: 0.13 10*3/uL — ABNORMAL HIGH (ref 0.00–0.07)
Basophils Absolute: 0.1 10*3/uL (ref 0.0–0.1)
Basophils Relative: 1 %
Eosinophils Absolute: 0.1 10*3/uL (ref 0.0–0.5)
Eosinophils Relative: 1 %
HCT: 36.6 % (ref 36.0–46.0)
Hemoglobin: 12.5 g/dL (ref 12.0–15.0)
Immature Granulocytes: 1 %
Lymphocytes Relative: 21 %
Lymphs Abs: 2.4 10*3/uL (ref 0.7–4.0)
MCH: 34.2 pg — ABNORMAL HIGH (ref 26.0–34.0)
MCHC: 34.2 g/dL (ref 30.0–36.0)
MCV: 100 fL (ref 80.0–100.0)
Monocytes Absolute: 1.4 10*3/uL — ABNORMAL HIGH (ref 0.1–1.0)
Monocytes Relative: 12 %
Neutro Abs: 7 10*3/uL (ref 1.7–7.7)
Neutrophils Relative %: 64 %
Platelet Count: 295 10*3/uL (ref 150–400)
RBC: 3.66 MIL/uL — ABNORMAL LOW (ref 3.87–5.11)
RDW: 14.2 % (ref 11.5–15.5)
WBC Count: 11.1 10*3/uL — ABNORMAL HIGH (ref 4.0–10.5)
nRBC: 0 % (ref 0.0–0.2)

## 2021-02-17 LAB — CMP (CANCER CENTER ONLY)
ALT: 28 U/L (ref 0–44)
AST: 17 U/L (ref 15–41)
Albumin: 3.1 g/dL — ABNORMAL LOW (ref 3.5–5.0)
Alkaline Phosphatase: 55 U/L (ref 38–126)
Anion gap: 9 (ref 5–15)
BUN: 14 mg/dL (ref 8–23)
CO2: 27 mmol/L (ref 22–32)
Calcium: 9.3 mg/dL (ref 8.9–10.3)
Chloride: 107 mmol/L (ref 98–111)
Creatinine: 0.65 mg/dL (ref 0.44–1.00)
GFR, Estimated: >60 mL/min (ref >60)
Glucose, Bld: 102 mg/dL — ABNORMAL HIGH (ref 70–99)
Potassium: 3.4 mmol/L — ABNORMAL LOW (ref 3.5–5.1)
Sodium: 143 mmol/L (ref 135–145)
Total Bilirubin: 0.3 mg/dL (ref 0.3–1.2)
Total Protein: 6.5 g/dL (ref 6.5–8.1)

## 2021-02-17 MED ORDER — SODIUM CHLORIDE 0.9 % IV SOLN
200.0000 mg | Freq: Once | INTRAVENOUS | Status: AC
  Administered 2021-02-17: 200 mg via INTRAVENOUS
  Filled 2021-02-17: qty 8

## 2021-02-17 MED ORDER — CYANOCOBALAMIN 1000 MCG/ML IJ SOLN
INTRAMUSCULAR | Status: AC
  Filled 2021-02-17: qty 1

## 2021-02-17 MED ORDER — SODIUM CHLORIDE 0.9 % IV SOLN
Freq: Once | INTRAVENOUS | Status: AC
Start: 2021-02-17 — End: 2021-02-17
  Filled 2021-02-17: qty 250

## 2021-02-17 MED ORDER — CYANOCOBALAMIN 1000 MCG/ML IJ SOLN
1000.0000 ug | Freq: Once | INTRAMUSCULAR | Status: AC
  Administered 2021-02-17: 1000 ug via INTRAMUSCULAR

## 2021-02-17 MED ORDER — LIDOCAINE-PRILOCAINE 2.5-2.5 % EX CREA
1.0000 "application " | TOPICAL_CREAM | CUTANEOUS | 0 refills | Status: DC | PRN
  Filled 2021-02-17: qty 30, 15d supply, fill #0

## 2021-02-17 MED ORDER — SODIUM CHLORIDE 0.9 % IV SOLN
500.0000 mg/m2 | Freq: Once | INTRAVENOUS | Status: AC
  Administered 2021-02-17: 800 mg via INTRAVENOUS
  Filled 2021-02-17: qty 20

## 2021-02-17 MED ORDER — HEPARIN SOD (PORK) LOCK FLUSH 100 UNIT/ML IV SOLN
500.0000 [IU] | Freq: Once | INTRAVENOUS | Status: DC | PRN
  Filled 2021-02-17: qty 5

## 2021-02-17 MED ORDER — HEPARIN SOD (PORK) LOCK FLUSH 100 UNIT/ML IV SOLN
500.0000 [IU] | Freq: Once | INTRAVENOUS | Status: AC | PRN
  Administered 2021-02-17: 500 [IU]
  Filled 2021-02-17: qty 5

## 2021-02-17 MED ORDER — PROCHLORPERAZINE MALEATE 10 MG PO TABS
ORAL_TABLET | ORAL | Status: AC
  Filled 2021-02-17: qty 1

## 2021-02-17 MED ORDER — SODIUM CHLORIDE 0.9% FLUSH
10.0000 mL | INTRAVENOUS | Status: DC | PRN
  Administered 2021-02-17: 10 mL
  Filled 2021-02-17: qty 10

## 2021-02-17 MED ORDER — PROCHLORPERAZINE MALEATE 10 MG PO TABS
10.0000 mg | ORAL_TABLET | Freq: Once | ORAL | Status: AC
  Administered 2021-02-17: 10 mg via ORAL

## 2021-02-17 MED ORDER — SODIUM CHLORIDE 0.9 % IV SOLN
Freq: Once | INTRAVENOUS | Status: DC
  Filled 2021-02-17: qty 250

## 2021-02-17 MED ORDER — FUROSEMIDE 20 MG PO TABS
20.0000 mg | ORAL_TABLET | Freq: Every day | ORAL | 0 refills | Status: DC
  Filled 2021-02-17: qty 30, 30d supply, fill #0

## 2021-02-17 MED ORDER — SODIUM CHLORIDE 0.9% FLUSH
10.0000 mL | INTRAVENOUS | Status: DC | PRN
Start: 2021-02-17 — End: 2021-02-17
  Filled 2021-02-17: qty 10

## 2021-02-17 MED ORDER — SODIUM CHLORIDE 0.9 % IV SOLN
10.0000 mg/kg | Freq: Once | INTRAVENOUS | Status: AC
  Administered 2021-02-17: 700 mg via INTRAVENOUS
  Filled 2021-02-17: qty 16

## 2021-02-17 MED ORDER — SODIUM CHLORIDE 0.9% FLUSH
10.0000 mL | Freq: Once | INTRAVENOUS | Status: AC
  Administered 2021-02-17: 10 mL
  Filled 2021-02-17: qty 10

## 2021-02-17 NOTE — Patient Instructions (Signed)
[facility]  Discharge Instructions: Thank you for choosing [facility] to provide your oncology and hematology care.   If you have a lab appointment with the [facility], please go directly to the [facility] and check in at the registration area.   Wear comfortable clothing and clothing appropriate for easy access to any Portacath or PICC line.   We strive to give you quality time with your provider. You may need to reschedule your appointment if you arrive late (15 or more minutes).  Arriving late affects you and other patients whose appointments are after yours.  Also, if you miss three or more appointments without notifying the office, you may be dismissed from the clinic at the provider's discretion.      For prescription refill requests, have your pharmacy contact our office and allow 72 hours for refills to be completed.    Today you received the following chemotherapy and/or immunotherapy agents: Bevacizumab; Keytruda; Alimta   To help prevent nausea and vomiting after your treatment, we encourage you to take your nausea medication as directed.  BELOW ARE SYMPTOMS THAT SHOULD BE REPORTED IMMEDIATELY: *FEVER GREATER THAN 100.4 F (38 C) OR HIGHER *CHILLS OR SWEATING *NAUSEA AND VOMITING THAT IS NOT CONTROLLED WITH YOUR NAUSEA MEDICATION *UNUSUAL SHORTNESS OF BREATH *UNUSUAL BRUISING OR BLEEDING *URINARY PROBLEMS (pain or burning when urinating, or frequent urination) *BOWEL PROBLEMS (unusual diarrhea, constipation, pain near the anus) TENDERNESS IN MOUTH AND THROAT WITH OR WITHOUT PRESENCE OF ULCERS (sore throat, sores in mouth, or a toothache) UNUSUAL RASH, SWELLING OR PAIN  UNUSUAL VAGINAL DISCHARGE OR ITCHING   Items with * indicate a potential emergency and should be followed up as soon as possible or go to the Emergency Department if any problems should occur.  Please show the CHEMOTHERAPY ALERT CARD or IMMUNOTHERAPY ALERT  CARD at check-in to the Emergency Department and triage nurse.  Should you have questions after your visit or need to cancel or reschedule your appointment, please contact [facility]  Dept: [phone]  and follow the prompts.  Office hours are 8:00 a.m. to 4:30 p.m. Monday - Friday. Please note that voicemails left after 4:00 p.m. may not be returned until the following business day.  We are closed weekends and major holidays. You have access to a nurse at all times for urgent questions. Please call the main number to the clinic Dept: [phone] and follow the prompts.   For any non-urgent questions, you may also contact your provider using MyChart. We now offer e-Visits for anyone 66 and older to request care online for non-urgent symptoms. For details visit [email or web address].   Also download the MyChart app! Go to the app store, search "MyChart", open the app, select [facility], and log in with your MyChart username and password.  Due to Covid, a mask is required upon entering the hospital/clinic. If you do not have a mask, one will be given to you upon arrival. For doctor visits, patients may have 1 support person aged 67 or older with them. For treatment visits, patients cannot have anyone with them due to current Covid guidelines and our immunocompromised population.

## 2021-02-21 ENCOUNTER — Encounter: Payer: Self-pay | Admitting: Hematology and Oncology

## 2021-02-21 ENCOUNTER — Encounter: Payer: Self-pay | Admitting: Internal Medicine

## 2021-02-21 NOTE — Progress Notes (Signed)
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 CT of the head showed showed concerning for metastatic disease involving the left cerebellum and right occipital lobe.  2) 09/30/2019: MRI brain confirms mass within the left cerebellum measures 1.6 x 1.3 x 1.5 cm as well as at least 8 metastatic lesions within the supratentorial and infratentorial brain as outlined 3) 10/01/2019: PET CT scan revealed hypermetabolic infrahilar right lower lobe mass with hypermetabolic nodal metastases in the right hilum, mediastinum, right supraclavicular region, left axilla and lower left neck. 4) 10/08/2019: US guided biopsy of the lymph nodes confirms poorly differentiated non-small cell carcinoma. Immunohistochemistry confirms adenocarcinoma of lung primary. PD-L1 and NGS later revealed a MET Exon 14 mutation and TPS of 90%.  5) 10/12/2019: Establish care with Dr. Lorenso Courier  6) 11/04/2019: Day 1 of capmatinib $RemoveBefor'400mg'ODSbGILPUuCG$  BID 7) 11/09/2019: presented to Rad/Onc with a DVT in LLE. Seen in symptom management clinic and started on apixaban.  8) 12/29/2019: CT C/A/P showed interval decrease in size of mass involving the superior segment of right lower lobe and reduction in mediastinal and left supraclavicular adenopathy though some small spinal lesions were noted in the interim between the two sets of scans 9) 01/25/2020: temporarily stopped capmatinib due to symptoms of nausea/fatigue. Restarted therapy on 01/30/2020 after brief chemo holiday.  10) 03/22/2020: CT C/A/P reveals a mixed picture, with new lung nodule and  increased lymphadenopathy, however response in bone lesions. D/c capmatinib therapy 11) 04/08/2020: start of Carbo/Pem/Pem for 2nd line treatment. Cycle 1 Day 1   12) 04/29/2020: Cycle 2 Day 1 Carbo/Pem/Pem  13) 05/20/2020: Cycle 3 Day 1 Carbo/Pem/Pem  14) 06/09/2020: Cycle 4 Day 1 Carbo/Pem/Pem  15) 06/29/2020: Cycle 5 Day 1 maintenance Pem/Pem  16) 07/20/2020: Cycle 6 Day 1 maintenance Pem/Pem  17) 08/12/2020: Cycle 7 Day 1 maintenance Pem/Pem  18) 09/01/2020: Cycle 8 Day 1 maintenance Pem/Pem  19) 09/21/2020: Cycle 9 Day 1 maintenance Pem/Pem  20) 10/13/2020: Cycle 10 Day 1 maintenance Pem/Pem  21) 11/03/2020: Cycle 11 Day 1 maintenance Pem/Pem  22) 11/25/2020:  Cycle 12 Day 1 maintenance Pem/Pem 23) 12/15/2020:  Cycle 13 Day 1 maintenance Pem/Pem 24) 01/05/2021:  Cycle 14 Day 1 maintenance Pem/Pem 25) 01/26/2021:  Cycle 15 Day 1 maintenance Pem/Pem plus Becacizumab.  02/17/2021: Cycle 16 Day 1 maintenance Pem/Pem plus Becacizumab.   Interval History:  Virginia [name] 65 y.o. female with medical history significant for metastatic adenocarcinoma of the lung presents for a follow up visit. The patient's last visit was on 01/26/2021. In the interim since the last visit, she has continued on dexamethasone 1 mg p.o. daily and the addition of bevacizumab to her regimen. Today is Cycle 16 Day 1.   On exam today Virginia [name] is accompanied by her husband.  She reports that she has "not been good".  She does that she has been having some dizziness and difficulty with steady gait.  She reports that she has to hold onto objects in order to walk straight.  She notes that she does have cough and during one coughing episode coughed up a small streak of blood.  She notes  that she is not having any chest pain or any other focal pain right now.  She is concerned about her increasing blood pressure.  She denies any fevers, chills, shortness of breath, chest pain, cough, neuropathy or skin changes. She has no other  complaints.  A full 10 point ROS is listed below.   MEDICAL HISTORY:  Past Medical History:  Diagnosis Date   Anemia    Anxiety    Concussion 09/28/2019   DVT (deep venous thrombosis) ([place]) 2021   L leg   Dyspnea    GERD (gastroesophageal reflux disease)    Hypercholesterolemia    per pt, she does not have elevated lipids   Hypertension    met lung ca dx'd 09/2019   mets to spine, hip and brain   PONV (postoperative nausea and vomiting)    Tobacco abuse     SURGICAL HISTORY: Past Surgical History:  Procedure Laterality Date   ABDOMINAL HYSTERECTOMY     partial/ left ovaries   APPLICATION OF CRANIAL NAVIGATION N/A 12/02/2020   Procedure: APPLICATION OF CRANIAL NAVIGATION;  Surgeon: Erline Levine, MD;  Location: [place];  Service: Neurosurgery;  Laterality: N/A;   CHOLECYSTECTOMY     CRANIOTOMY N/A 12/02/2020   Procedure: Posterior fossa craniotomy for tumor resection with brainlab;  Surgeon: Erline Levine, MD;  Location: [place];  Service: Neurosurgery;  Laterality: N/A;   DILATION AND CURETTAGE OF UTERUS     IR IMAGING GUIDED PORT INSERTION  10/23/2019   KYPHOPLASTY N/A 03/15/2020   Procedure: Thoracic Eight KYPHOPLASTY;  Surgeon: Erline Levine, MD;  Location: [place];  Service: Neurosurgery;  Laterality: N/A;  prone    LIPOMA EXCISION  2018   removed under left breast and right thigh.   TUBAL LIGATION      ALLERGIES:  has No Known Allergies.  MEDICATIONS:  Current Outpatient Medications  Medication Sig Dispense Refill   furosemide (LASIX) 20 MG tablet Take 1 tablet (20 mg total) by mouth daily. 30 tablet 0   acetaminophen (TYLENOL) 500 MG tablet Take 1,000 mg by mouth every 8 (eight) hours as needed for moderate pain.     albuterol (VENTOLIN HFA) 108 (90 Base) MCG/ACT inhaler INHALE 2 PUFFS INTO THE LUNGS EVERY 6 HOURS AS NEEDED FOR WHEEZING OR SHORTNESS OF BREATH (Patient taking differently: Inhale 2 puffs into the lungs every 6 (six) hours as needed for shortness of breath or  wheezing.) 6.7 g 11   ALPRAZolam (XANAX) 0.25 MG tablet Take 1 tablet (0.25 mg total) by mouth 2 (two) times daily as needed for anxiety 60 tablet 5   amLODipine (NORVASC) 10 MG tablet Take 1 tablet (10 mg total) by mouth daily. 30 tablet 3   Cyanocobalamin (VITAMIN B12 PO) Take 2 tablets by mouth daily. Gummies     dexamethasone (DECADRON) 1 MG tablet Take 2 tablets (2 mg total) by mouth daily. 60 tablet 1   folic acid (FOLVITE) 1 MG tablet TAKE 1 TABLET BY MOUTH DAILY. (Patient taking differently: Take 1 mg by mouth daily.) 90 tablet 1   lidocaine-prilocaine (EMLA) cream Apply to port as needed. 30 g 0   Multiple Vitamins-Minerals (AIRBORNE PO) Take 1 tablet by mouth daily.      ofloxacin (OCUFLOX) 0.3 % ophthalmic solution 2 drops each eye 4 times a day for 5 days (Patient taking differently: Place 2 drops into both eyes daily as needed (irritation).) 5 mL 1   OLANZapine (ZYPREXA) 2.5 MG tablet TAKE 1 TABLET BY MOUTH EVERY NIGHT  AT BEDTIME 90 tablet 1   ondansetron (ZOFRAN) 4 MG tablet TAKE 1 TO 2 TABLETS BY MOUTH 2 TIMES DAILY AS NEEDED (Patient not taking: Reported on 01/02/2021) 60 tablet 0   oxyCODONE (OXY IR/ROXICODONE) 5 MG immediate release tablet TAKE 1 TO 2 TABLETS BY MOUTH EVERY 4 HOURS AS NEEDED FOR SEVERE PAIN 90 tablet 0   pantoprazole (PROTONIX) 40 MG tablet TAKE ONE TABLET BY MOUTH DAILY (Patient taking differently: Take 40 mg by mouth daily.) 90 tablet 3   pentoxifylline (TRENTAL) 400 MG CR tablet TAKE 1 TABLET BY MOUTH WITH FOOD FOR 1 WEEK THEN TAKE 1 TABLET BY MOUTH 2 TIMES DAILY THEREAFTER 60 tablet 2   polyethylene glycol (MIRALAX / GLYCOLAX) 17 g packet Take 17 g by mouth 2 (two) times daily.      promethazine (PHENERGAN) 12.5 MG tablet TAKE 1 TO 2 TABLETS BY MOUTH EVERY 4 HOURS AS NEEDED FOR REFRACTORY NAUSEA OR VOMITING 30 tablet 0   Vitamin D3 (VITAMIN D) 25 MCG tablet Take 2,000 Units by mouth daily.     vitamin E 180 MG (400 UNITS) capsule Take 400 IU daily x 1 week, then  400 IU BID (Patient taking differently: Take 400 Units by mouth in the morning and at bedtime. Take 400 IU daily x 1 week, then 400 IU BID) 60 capsule 4   XTAMPZA ER 13.5 MG C12A Take 1 capsule by mouth in the morning and at bedtime. (every 12 hours) 60 capsule 0   No current facility-administered medications for this visit.    REVIEW OF SYSTEMS:   Constitutional: ( - ) fevers, ( - )  chills , ( - ) night sweats Eyes: ( - ) blurriness of vision, ( - ) double vision, ( - ) watery eyes Ears, nose, mouth, throat, and face: ( - ) mucositis, ( - ) sore throat Respiratory: ( - ) cough, ( - ) dyspnea, ( - ) wheezes Cardiovascular: ( - ) palpitation, ( - ) chest discomfort, ( - ) lower extremity swelling Gastrointestinal:  ( - ) nausea, ( - ) heartburn, ( - ) change in bowel habits Skin: ( - ) abnormal skin rashes Lymphatics: ( - ) new lymphadenopathy, ( - ) easy bruising Neurological: ( - ) numbness, ( - ) tingling, ( - ) new weaknesses Behavioral/Psych: ( - ) mood change, ( - ) new changes  All other systems were reviewed with the patient and are negative.  PHYSICAL EXAMINATION: ECOG PERFORMANCE STATUS: 1 - Symptomatic but completely ambulatory  Vitals:   02/17/21 1106  BP: (!) 145/110  Pulse: (!) 110  Resp: 19  Temp: 97.7 F (36.5 C)  SpO2: 98%   Filed Weights   02/17/21 1106  Weight: 156 lb 12.8 oz (71.1 kg)    GENERAL: well appearing middle aged Caucasian female in NAD  SKIN: skin color, texture, turgor are normal, no rashes or significant lesions EYES: conjunctiva are pink and non-injected, sclera clear LUNGS: clear to auscultation and percussion with normal breathing effort HEART: regular rate & rhythm and no murmurs and bilateral + trace pitting lower extremity edema Musculoskeletal: no cyanosis of digits and no clubbing PSYCH: alert & oriented x 3, fluent speech NEURO: no focal motor/sensory deficits    LABORATORY DATA:  I have reviewed the data as listed CBC Latest  Ref Rng & Units 02/17/2021 01/26/2021 01/05/2021  WBC 4.0 - 10.5 K/uL 11.1(H) 11.6(H) 9.9  Hemoglobin 12.0 - 15.0 g/dL 12.5 12.5 11.8(L)  Hematocrit 36.0 -  46.0 % 36.6 36.0 33.6(L)  Platelets 150 - 400 K/uL 295 321 270    CMP Latest Ref Rng & Units 02/17/2021 01/26/2021 01/05/2021  Glucose 70 - 99 mg/dL 102(H) 119(H) 121(H)  BUN 8 - 23 mg/dL 14 14 11   Creatinine 0.44 - 1.00 mg/dL 0.65 0.67 0.79  Sodium 135 - 145 mmol/L 143 143 140  Potassium 3.5 - 5.1 mmol/L 3.4(L) 3.8 4.1  Chloride 98 - 111 mmol/L 107 105 103  CO2 22 - 32 mmol/L 27 29 29   Calcium 8.9 - 10.3 mg/dL 9.3 8.9 8.5(L)  Total Protein 6.5 - 8.1 g/dL 6.5 6.5 6.4(L)  Total Bilirubin 0.3 - 1.2 mg/dL 0.3 0.4 0.3  Alkaline Phos 38 - 126 U/L 55 55 55  AST 15 - 41 U/L 17 20 18   ALT 0 - 44 U/L 28 27 31      RADIOGRAPHIC STUDIES: I personally have viewed the radiographic studies below: No results found.   ASSESSMENT & PLAN Virginia [name] 65 y.o. female with medical history significant for metastatic adenocarcinoma of the lung presents for a follow up visit. 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   Previously we discussed Carboplatin/Pembrolizumab/Pemetrexed and the expected side effect from these medications.  We discussed the possible side effects of immunotherapy including colitis, hepatitis, dermatitis, and pneumonitis.  Additionally we discussed the side effects of carboplatin which include suppression of blood counts, nephrotoxicity, and nausea.  Additionally we discussed pemetrexed which can also have effects on counts and would require supplementation with vitamin O11 and folic acid.  She voiced her understanding of these side effects and treatment plans moving forward.  I also noted that we would be able to drop the chemotherapy agents that she had intolerance to continue with pembrolizumab alone given her high TPS score.  The patient has a markedly elevated TPS score (90%)   Virginia [name]  continues to tolerate the treatment. Her labs from today were reviewed without any intervention needed. Patient will proceed with treatment as planned and return to the clinic in 3 weeks prior to C17.   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 --patient will be followed every 3 weeks with labs and clinic visit. --port in place, patient completed chemotherapy education.  --plan for repeat CT C/A/P in July 2022 (q 3 months) --supportive therapy as listed below.  --RTC q 3 weeks for continued monitoring/chemotherapy treatments.    #Nausea/Vomiting -- provided patient with Zofran 4-8mg  q8h PRN and compazine 10mg  q6H for breakthrough PRN. These have not been required since olanzapine was added --continue olanzpaine 2.5mg  PO QHS to help with nausea. Marked improvement since the addition of this medication on 01/29/2020.   --continue to monitor   #Hypokalemia, stable --likely 2/2 to decreased PO intake, hypovolemia, and BP medications --Potassium level is within normal limits today.  --continue to monitor  #Pain Control, stable --continue oxycodone 5mg  q4H PRN. Can increase dose to 10mg  q6H if pain is severe. She is taking approximately 2-3 pills per day.  --continue Xtampza ER BID for long acting support.  --continue to take with Senokot to prevent opioid induced constipation. Miralax PRN   #Supportive Therapy --continue EMLA cream with port --zometa 4g IV q 12 weeks for bone metastasis, dental clearance  received.  --nausea as above    #Brain Metastasis, progressing --appreciate the assistance of Dr. 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 --radiation therapy to the brain completed on 11/16/2019.  --MRI brain on 04/29/2020 showed evidence of small brain bleed. Eliquis  therapy was discontinued.  --MRI on 11/04/2020 showed concern for progression of intracranial disease.  --On 12/02/2020, patient underwent craniotomy, resection of progressive cerebellar mass by Dr. Vertell Limber. 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  --MRI on 12/23/2020 showed evidence of radiation necrosis. Dr. Mickeal Skinner evaluated the patient on 01/02/2021 with recommendations to add IV Bevacizumab 10 mg/kg q 3 weeks starting today (01/26/2021). --she has continued dexamethasone 1mg  PO daily.   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    # Hypertension: --BP at today's visit was 145/110. Increase Norvasc to 10 mg daily.  --continue to monitor at home and in clinic.    Orders Placed This Encounter  Procedures   CT CHEST ABDOMEN PELVIS W CONTRAST    Standing Status:   Future    Standing Expiration Date:   02/21/2022    Order Specific Question:   If indicated for the ordered procedure, I authorize the administration of contrast media per Radiology protocol    Answer:   Yes    Order Specific Question:   Preferred imaging location?    Answer:   [facility]    Order Specific Question:   Is Oral Contrast requested for this exam?    Answer:   Yes, Per Radiology protocol    Order Specific Question:   Reason for Exam (SYMPTOM  OR DIAGNOSIS REQUIRED)    Answer:   metastatic lung cancer, assess for progression.    All questions were answered. The patient knows to call the clinic with any problems, questions or concerns.  A total of more than 30 minutes were spent on this encounter and over half of that time was spent on counseling and coordination of care as outlined above.   Ledell Peoples, MD Department of Hematology/Oncology [place] [facility] Phone: [phone] Pager: [phone] Email: [email or web address]   02/21/2021  3:50 PM   Literature Support:  Lorenza Chick, Rodrguez-Abreu D, Duffy Rhody, Felip E, De Angelis F, Domine M, [place], Hochmair MJ, [place], [place], Bischoff HG, [place], Grossi F, [place], Reck M, [place], [place], [place], Rubio-Viqueira B, Novello S, Kurata T, Gray JE, Vida J, Wei Z, Yang J, Raftopoulos H, [place], [place] [employer]; [id number] Investigators. Pembrolizumab plus Chemotherapy in Metastatic Non-Small-Cell Lung Cancer. [place] Med. 2018 May 31;378(22):2078-2092.  --Median progression-free survival was 8.8 months (95% CI, 7.6 to 9.2) in the pembrolizumab-combination group and 4.9 months (95% CI, 4.7 to 5.5) in the placebo-combination group (hazard ratio for disease progression or death, 0.52; 95% CI, 0.43 to 0.64; P<0.001). Adverse events of grade 3 or higher occurred in 67.2% of the patients in the pembrolizumab-combination group and in 65.8% of those in the placebo-combination group.  Jodene Nam L. Efficacy of pemetrexed-based regimens in advanced non-small cell lung cancer patients with activating epidermal growth factor receptor mutations after tyrosine kinase inhibitor failure: a systematic review. Onco Targets Ther. 2018;11:2121-2129.   --The weighted median PFS, median OS, and ORR for patients treated with pem regimens were 5.09 months, 15.91 months, and 30.19%, respectively. Our systematic  review results showed a favorable efficacy profile of pem regimens in NSCLC patients with EGFR mutation after EGFR-TKI failure.

## 2021-02-23 DIAGNOSIS — Z0289 Encounter for other administrative examinations: Secondary | ICD-10-CM

## 2021-02-23 NOTE — Telephone Encounter (Signed)
Records was received

## 2021-03-03 ENCOUNTER — Encounter: Payer: Self-pay | Admitting: Internal Medicine

## 2021-03-07 ENCOUNTER — Telehealth: Payer: Self-pay | Admitting: *Deleted

## 2021-03-07 NOTE — Telephone Encounter (Signed)
Patients spouse called needing advice and possible recommendations.  He states that he observed the patient sitting at night watching tv and she would have her tongue slightly sticking out while watching tv.  (Patient not aware of this observation).  He also reports that at times her left hand would do uncontrolled movements and while brushing her teeth this morning her left leg starting jumping.  When spouse made comment about that movement patient stated that it has been happening for some time.    She has no history of use of antiseizure medication and in the past 2-3 weeks tapered off of her Decadron.   During that time of tapering off of the steroid she reported increased nausea.  She currently is taking olanzipine HS and zofran during the daytime.  They report that the zofran is not preventing the nausea at this time.  Needing help  on how to control the nausea.  Spouse asked about order for DME like walker as patient has to grab ahold of things to safely get around.  Order placed for walker.  Need guidance on other symptoms.  Routed to Dr Lorenso Courier and Dr Mickeal Skinner.

## 2021-03-09 ENCOUNTER — Telehealth: Payer: Self-pay | Admitting: *Deleted

## 2021-03-09 ENCOUNTER — Other Ambulatory Visit: Payer: Self-pay | Admitting: *Deleted

## 2021-03-09 NOTE — Telephone Encounter (Signed)
Per Dr Lorenso Courier ok to increase the Olanzapine to 5 mg once at night and stop the zofran.    Explained she could go back to Decadron 1 mg.    Husband has no further questions.

## 2021-03-10 ENCOUNTER — Inpatient Hospital Stay: Payer: BC Managed Care – PPO

## 2021-03-10 ENCOUNTER — Other Ambulatory Visit: Payer: Self-pay | Admitting: *Deleted

## 2021-03-10 ENCOUNTER — Other Ambulatory Visit: Payer: Self-pay | Admitting: Hematology and Oncology

## 2021-03-10 ENCOUNTER — Other Ambulatory Visit: Payer: Self-pay

## 2021-03-10 ENCOUNTER — Inpatient Hospital Stay: Payer: BC Managed Care – PPO | Attending: Hematology and Oncology | Admitting: Hematology and Oncology

## 2021-03-10 ENCOUNTER — Other Ambulatory Visit (HOSPITAL_COMMUNITY): Payer: Self-pay

## 2021-03-10 VITALS — HR 97

## 2021-03-10 VITALS — BP 133/94 | HR 103 | Temp 97.5°F | Resp 18 | Wt 155.4 lb

## 2021-03-10 DIAGNOSIS — Z7901 Long term (current) use of anticoagulants: Secondary | ICD-10-CM | POA: Insufficient documentation

## 2021-03-10 DIAGNOSIS — Z86718 Personal history of other venous thrombosis and embolism: Secondary | ICD-10-CM | POA: Insufficient documentation

## 2021-03-10 DIAGNOSIS — M7989 Other specified soft tissue disorders: Secondary | ICD-10-CM | POA: Insufficient documentation

## 2021-03-10 DIAGNOSIS — C3490 Malignant neoplasm of unspecified part of unspecified bronchus or lung: Secondary | ICD-10-CM

## 2021-03-10 DIAGNOSIS — C7931 Secondary malignant neoplasm of brain: Secondary | ICD-10-CM

## 2021-03-10 DIAGNOSIS — R233 Spontaneous ecchymoses: Secondary | ICD-10-CM | POA: Insufficient documentation

## 2021-03-10 DIAGNOSIS — Z923 Personal history of irradiation: Secondary | ICD-10-CM | POA: Diagnosis not present

## 2021-03-10 DIAGNOSIS — Z9049 Acquired absence of other specified parts of digestive tract: Secondary | ICD-10-CM | POA: Diagnosis not present

## 2021-03-10 DIAGNOSIS — Z79899 Other long term (current) drug therapy: Secondary | ICD-10-CM | POA: Diagnosis not present

## 2021-03-10 DIAGNOSIS — R112 Nausea with vomiting, unspecified: Secondary | ICD-10-CM | POA: Diagnosis not present

## 2021-03-10 DIAGNOSIS — C7951 Secondary malignant neoplasm of bone: Secondary | ICD-10-CM | POA: Diagnosis not present

## 2021-03-10 DIAGNOSIS — I6789 Other cerebrovascular disease: Secondary | ICD-10-CM

## 2021-03-10 DIAGNOSIS — C3431 Malignant neoplasm of lower lobe, right bronchus or lung: Secondary | ICD-10-CM | POA: Diagnosis not present

## 2021-03-10 DIAGNOSIS — J432 Centrilobular emphysema: Secondary | ICD-10-CM | POA: Insufficient documentation

## 2021-03-10 DIAGNOSIS — Z5112 Encounter for antineoplastic immunotherapy: Secondary | ICD-10-CM | POA: Insufficient documentation

## 2021-03-10 DIAGNOSIS — G939 Disorder of brain, unspecified: Secondary | ICD-10-CM | POA: Insufficient documentation

## 2021-03-10 DIAGNOSIS — K838 Other specified diseases of biliary tract: Secondary | ICD-10-CM | POA: Insufficient documentation

## 2021-03-10 DIAGNOSIS — Z5111 Encounter for antineoplastic chemotherapy: Secondary | ICD-10-CM | POA: Diagnosis not present

## 2021-03-10 DIAGNOSIS — I700 Atherosclerosis of aorta: Secondary | ICD-10-CM | POA: Insufficient documentation

## 2021-03-10 DIAGNOSIS — Z95828 Presence of other vascular implants and grafts: Secondary | ICD-10-CM | POA: Diagnosis not present

## 2021-03-10 DIAGNOSIS — I10 Essential (primary) hypertension: Secondary | ICD-10-CM | POA: Diagnosis not present

## 2021-03-10 DIAGNOSIS — R918 Other nonspecific abnormal finding of lung field: Secondary | ICD-10-CM

## 2021-03-10 LAB — CBC WITH DIFFERENTIAL (CANCER CENTER ONLY)
Abs Immature Granulocytes: 0.01 10*3/uL (ref 0.00–0.07)
Basophils Absolute: 0 10*3/uL (ref 0.0–0.1)
Basophils Relative: 1 %
Eosinophils Absolute: 0.2 10*3/uL (ref 0.0–0.5)
Eosinophils Relative: 3 %
HCT: 36.8 % (ref 36.0–46.0)
Hemoglobin: 13 g/dL (ref 12.0–15.0)
Immature Granulocytes: 0 %
Lymphocytes Relative: 33 %
Lymphs Abs: 1.8 10*3/uL (ref 0.7–4.0)
MCH: 34.3 pg — ABNORMAL HIGH (ref 26.0–34.0)
MCHC: 35.3 g/dL (ref 30.0–36.0)
MCV: 97.1 fL (ref 80.0–100.0)
Monocytes Absolute: 0.9 10*3/uL (ref 0.1–1.0)
Monocytes Relative: 17 %
Neutro Abs: 2.4 10*3/uL (ref 1.7–7.7)
Neutrophils Relative %: 46 %
Platelet Count: 310 10*3/uL (ref 150–400)
RBC: 3.79 MIL/uL — ABNORMAL LOW (ref 3.87–5.11)
RDW: 13.1 % (ref 11.5–15.5)
WBC Count: 5.3 10*3/uL (ref 4.0–10.5)
nRBC: 0 % (ref 0.0–0.2)

## 2021-03-10 LAB — CMP (CANCER CENTER ONLY)
ALT: 52 U/L — ABNORMAL HIGH (ref 0–44)
AST: 41 U/L (ref 15–41)
Albumin: 3.1 g/dL — ABNORMAL LOW (ref 3.5–5.0)
Alkaline Phosphatase: 64 U/L (ref 38–126)
Anion gap: 11 (ref 5–15)
BUN: 6 mg/dL — ABNORMAL LOW (ref 8–23)
CO2: 31 mmol/L (ref 22–32)
Calcium: 9.2 mg/dL (ref 8.9–10.3)
Chloride: 101 mmol/L (ref 98–111)
Creatinine: 0.68 mg/dL (ref 0.44–1.00)
GFR, Estimated: >60 mL/min (ref >60)
Glucose, Bld: 130 mg/dL — ABNORMAL HIGH (ref 70–99)
Potassium: 3 mmol/L — ABNORMAL LOW (ref 3.5–5.1)
Sodium: 143 mmol/L (ref 135–145)
Total Bilirubin: 0.3 mg/dL (ref 0.3–1.2)
Total Protein: 6.7 g/dL (ref 6.5–8.1)

## 2021-03-10 LAB — TSH: TSH: 1.654 u[IU]/mL (ref 0.308–3.960)

## 2021-03-10 MED ORDER — SODIUM CHLORIDE 0.9 % IV SOLN
200.0000 mg | Freq: Once | INTRAVENOUS | Status: AC
  Administered 2021-03-10: 200 mg via INTRAVENOUS
  Filled 2021-03-10: qty 8

## 2021-03-10 MED ORDER — SODIUM CHLORIDE 0.9% FLUSH
10.0000 mL | INTRAVENOUS | Status: DC | PRN
  Filled 2021-03-10: qty 10

## 2021-03-10 MED ORDER — SODIUM CHLORIDE 0.9 % IV SOLN
Freq: Once | INTRAVENOUS | Status: DC
  Filled 2021-03-10: qty 250

## 2021-03-10 MED ORDER — SODIUM CHLORIDE 0.9 % IV SOLN
10.0000 mg/kg | Freq: Once | INTRAVENOUS | Status: AC
  Administered 2021-03-10: 700 mg via INTRAVENOUS
  Filled 2021-03-10: qty 16

## 2021-03-10 MED ORDER — SODIUM CHLORIDE 0.9 % IV SOLN
Freq: Once | INTRAVENOUS | Status: AC
Start: 2021-03-10 — End: 2021-03-10
  Filled 2021-03-10: qty 250

## 2021-03-10 MED ORDER — PROCHLORPERAZINE MALEATE 10 MG PO TABS
ORAL_TABLET | ORAL | Status: AC
  Filled 2021-03-10: qty 1

## 2021-03-10 MED ORDER — PROCHLORPERAZINE MALEATE 10 MG PO TABS
10.0000 mg | ORAL_TABLET | Freq: Once | ORAL | Status: AC
Start: 2021-03-10 — End: 2021-03-10
  Administered 2021-03-10: 10 mg via ORAL

## 2021-03-10 MED ORDER — HEPARIN SOD (PORK) LOCK FLUSH 100 UNIT/ML IV SOLN
500.0000 [IU] | Freq: Once | INTRAVENOUS | Status: DC | PRN
  Filled 2021-03-10: qty 5

## 2021-03-10 MED ORDER — FOLIC ACID 1 MG PO TABS
ORAL_TABLET | Freq: Every day | ORAL | 1 refills | Status: DC
  Filled 2021-03-10: qty 90, 90d supply, fill #0
  Filled 2021-07-11: qty 90, 90d supply, fill #1

## 2021-03-10 MED ORDER — SODIUM CHLORIDE 0.9 % IV SOLN
500.0000 mg/m2 | Freq: Once | INTRAVENOUS | Status: AC
  Administered 2021-03-10: 800 mg via INTRAVENOUS
  Filled 2021-03-10: qty 12

## 2021-03-10 NOTE — Progress Notes (Signed)
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 CT of the head showed showed concerning for metastatic disease involving the left cerebellum and right occipital lobe.  2) 09/30/2019: MRI brain confirms mass within the left cerebellum measures 1.6 x 1.3 x 1.5 cm as well as at least 8 metastatic lesions within the supratentorial and infratentorial brain as outlined 3) 10/01/2019: PET CT scan revealed hypermetabolic infrahilar right lower lobe mass with hypermetabolic nodal metastases in the right hilum, mediastinum, right supraclavicular region, left axilla and lower left neck. 4) 10/08/2019: US guided biopsy of the lymph nodes confirms poorly differentiated non-small cell carcinoma. Immunohistochemistry confirms adenocarcinoma of lung primary. PD-L1 and NGS later revealed a MET Exon 14 mutation and TPS of 90%.  5) 10/12/2019: Establish care with Dr. Lorenso Courier  6) 11/04/2019: Day 1 of capmatinib 416m BID 7) 11/09/2019: presented to Rad/Onc with a DVT in LLE. Seen in symptom management clinic and started on apixaban.  8) 12/29/2019: CT C/A/P showed interval decrease in size of mass involving the superior segment of right lower lobe and reduction in mediastinal and left supraclavicular adenopathy though some small spinal lesions were noted in the interim between the two sets of scans 9) 01/25/2020: temporarily stopped capmatinib due to symptoms of nausea/fatigue. Restarted therapy on 01/30/2020 after brief chemo holiday.  10) 03/22/2020: CT C/A/P reveals a mixed picture, with new lung nodule and  increased lymphadenopathy, however response in bone lesions. D/c capmatinib therapy 11) 04/08/2020: start of Carbo/Pem/Pem for 2nd line treatment. Cycle 1 Day 1   12) 04/29/2020: Cycle 2 Day 1 Carbo/Pem/Pem  13) 05/20/2020: Cycle 3 Day 1 Carbo/Pem/Pem  14) 06/09/2020: Cycle 4 Day 1 Carbo/Pem/Pem  15) 06/29/2020: Cycle 5 Day 1 maintenance Pem/Pem  16) 07/20/2020: Cycle 6 Day 1 maintenance Pem/Pem  17) 08/12/2020: Cycle 7 Day 1 maintenance Pem/Pem  18) 09/01/2020: Cycle 8 Day 1 maintenance Pem/Pem  19) 09/21/2020: Cycle 9 Day 1 maintenance Pem/Pem  20) 10/13/2020: Cycle 10 Day 1 maintenance Pem/Pem  21) 11/03/2020: Cycle 11 Day 1 maintenance Pem/Pem  22) 11/25/2020:  Cycle 12 Day 1 maintenance Pem/Pem 23) 12/15/2020:  Cycle 13 Day 1 maintenance Pem/Pem 24) 01/05/2021:  Cycle 14 Day 1 maintenance Pem/Pem 25) 01/26/2021:  Cycle 15 Day 1 maintenance Pem/Pem plus Becacizumab.  02/17/2021: Cycle 16 Day 1 maintenance Pem/Pem plus Becacizumab.  03/10/2021: Cycle 17 Day 1 maintenance Pem/Pem plus Becacizumab.   Interval History:  PZOIE SARIN89y.o. female with medical history significant for metastatic adenocarcinoma of the lung presents for a follow up visit. The patient's last visit was on 02/17/2021. In the interim since the last visit, she has continued pem/pem with the addition of bevacizumab to her regimen. Today is Cycle 17 Day 1.   On exam today Mrs. Clevenger is accompanied by her husband.  She reports that she gets increasing short of breath when trying to talk.  Unfortunately she is not doing as well as she had been previously.  She continues to have lightheadedness and dizziness and is concerned about her blood pressure today, though it is not terribly highly elevated.  She has had increase in nausea  for which she has increased her dose of olanzapine to 5 mg p.o. nightly.  There is some issues with her balance as well when she tries to walk.  She denies any fevers, chills, shortness of breath, chest pain,  cough, neuropathy or skin changes. She has no other complaints.  A full 10 point ROS is listed below.   MEDICAL HISTORY:  Past Medical History:  Diagnosis Date   Anemia    Anxiety    Concussion 09/28/2019   DVT (deep venous thrombosis) (Interior) 2021   L leg   Dyspnea    GERD (gastroesophageal reflux disease)    Hypercholesterolemia    per pt, she does not have elevated lipids   Hypertension    met lung ca dx'd 09/2019   mets to spine, hip and brain   PONV (postoperative nausea and vomiting)    Tobacco abuse     SURGICAL HISTORY: Past Surgical History:  Procedure Laterality Date   ABDOMINAL HYSTERECTOMY     partial/ left ovaries   APPLICATION OF CRANIAL NAVIGATION N/A 12/02/2020   Procedure: APPLICATION OF CRANIAL NAVIGATION;  Surgeon: Erline Levine, MD;  Location: [facility];  Service: Neurosurgery;  Laterality: N/A;   CHOLECYSTECTOMY     CRANIOTOMY N/A 12/02/2020   Procedure: Posterior fossa craniotomy for tumor resection with brainlab;  Surgeon: Erline Levine, MD;  Location: [facility];  Service: Neurosurgery;  Laterality: N/A;   DILATION AND CURETTAGE OF UTERUS     IR IMAGING GUIDED PORT INSERTION  10/23/2019   KYPHOPLASTY N/A 03/15/2020   Procedure: Thoracic Eight KYPHOPLASTY;  Surgeon: Erline Levine, MD;  Location: [facility];  Service: Neurosurgery;  Laterality: N/A;  prone    LIPOMA EXCISION  2018   removed under left breast and right thigh.   TUBAL LIGATION      ALLERGIES:  has No Known Allergies.  MEDICATIONS:  Current Outpatient Medications  Medication Sig Dispense Refill   acetaminophen (TYLENOL) 500 MG tablet Take 1,000 mg by mouth every 8 (eight) hours as needed for moderate pain.     albuterol (VENTOLIN HFA) 108 (90 Base) MCG/ACT inhaler INHALE 2 PUFFS INTO THE LUNGS EVERY 6 HOURS AS NEEDED FOR WHEEZING OR SHORTNESS OF BREATH (Patient taking differently: Inhale 2 puffs into the lungs every 6 (six) hours as needed for shortness of breath or wheezing.) 6.7 g 11   ALPRAZolam  (XANAX) 0.25 MG tablet Take 1 tablet (0.25 mg total) by mouth 2 (two) times daily as needed for anxiety 60 tablet 5   amLODipine (NORVASC) 10 MG tablet Take 1 tablet (10 mg total) by mouth daily. 30 tablet 3   Cyanocobalamin (VITAMIN B12 PO) Take 2 tablets by mouth daily. Gummies     folic acid (FOLVITE) 1 MG tablet Take 1 tablet by mouth daily. 90 tablet 1   furosemide (LASIX) 20 MG tablet Take 1 tablet (20 mg total) by mouth daily. 30 tablet 0   hydrocortisone (CORTEF) 10 MG tablet Take 2 tablets (20 mg total) by mouth daily. 90 tablet 1   lidocaine-prilocaine (EMLA) cream Apply to port as needed. 30 g 0   Multiple Vitamins-Minerals (AIRBORNE PO) Take 1 tablet by mouth daily.      ofloxacin (OCUFLOX) 0.3 % ophthalmic solution 2 drops each eye 4 times a day for 5 days (Patient taking differently: Place 2 drops into both eyes daily as needed (irritation).) 5 mL 1   OLANZapine (ZYPREXA) 2.5 MG tablet TAKE 1 TABLET BY MOUTH EVERY NIGHT AT BEDTIME 90 tablet  1   ondansetron (ZOFRAN) 4 MG tablet TAKE 1 TO 2 TABLETS BY MOUTH 2 TIMES DAILY AS NEEDED 60 tablet 0   oxyCODONE (OXY IR/ROXICODONE) 5 MG immediate release tablet TAKE 1 TO 2 TABLETS BY MOUTH EVERY 4 HOURS AS NEEDED FOR SEVERE PAIN 90 tablet 0   pantoprazole (PROTONIX) 40 MG tablet TAKE ONE TABLET BY MOUTH DAILY (Patient taking differently: Take 40 mg by mouth daily.) 90 tablet 3   pentoxifylline (TRENTAL) 400 MG CR tablet TAKE 1 TABLET BY MOUTH WITH FOOD FOR 1 WEEK THEN TAKE 1 TABLET BY MOUTH 2 TIMES DAILY THEREAFTER 60 tablet 2   polyethylene glycol (MIRALAX / GLYCOLAX) 17 g packet Take 17 g by mouth 2 (two) times daily.      promethazine (PHENERGAN) 12.5 MG tablet TAKE 1 TO 2 TABLETS BY MOUTH EVERY 4 HOURS AS NEEDED FOR REFRACTORY NAUSEA OR VOMITING 30 tablet 0   Vitamin D3 (VITAMIN D) 25 MCG tablet Take 2,000 Units by mouth daily.     vitamin E 180 MG (400 UNITS) capsule Take 400 IU daily x 1 week, then 400 IU BID (Patient taking differently:  Take 400 Units by mouth in the morning and at bedtime. Take 400 IU daily x 1 week, then 400 IU BID) 60 capsule 4   XTAMPZA ER 13.5 MG C12A Take 1 capsule by mouth in the morning and at bedtime. (every 12 hours) 60 capsule 0   No current facility-administered medications for this visit.    REVIEW OF SYSTEMS:   Constitutional: ( - ) fevers, ( - )  chills , ( - ) night sweats Eyes: ( - ) blurriness of vision, ( - ) double vision, ( - ) watery eyes Ears, nose, mouth, throat, and face: ( - ) mucositis, ( - ) sore throat Respiratory: ( - ) cough, ( - ) dyspnea, ( - ) wheezes Cardiovascular: ( - ) palpitation, ( - ) chest discomfort, ( - ) lower extremity swelling Gastrointestinal:  ( - ) nausea, ( - ) heartburn, ( - ) change in bowel habits Skin: ( - ) abnormal skin rashes Lymphatics: ( - ) new lymphadenopathy, ( - ) easy bruising Neurological: ( - ) numbness, ( - ) tingling, ( - ) new weaknesses Behavioral/Psych: ( - ) mood change, ( - ) new changes  All other systems were reviewed with the patient and are negative.  PHYSICAL EXAMINATION: ECOG PERFORMANCE STATUS: 1 - Symptomatic but completely ambulatory  Vitals:   03/10/21 1056  BP: (!) 133/94  Pulse: (!) 103  Resp: 18  Temp: (!) 97.5 F (36.4 C)  SpO2: 98%   Filed Weights   03/10/21 1056  Weight: 155 lb 6.4 oz (70.5 kg)    GENERAL: well appearing middle aged Caucasian female in NAD  SKIN: skin color, texture, turgor are normal, no rashes or significant lesions EYES: conjunctiva are pink and non-injected, sclera clear LUNGS: clear to auscultation and percussion with normal breathing effort HEART: regular rate & rhythm and no murmurs and bilateral + trace pitting lower extremity edema Musculoskeletal: no cyanosis of digits and no clubbing PSYCH: alert & oriented x 3, fluent speech NEURO: no focal motor/sensory deficits    LABORATORY DATA:  I have reviewed the data as listed CBC Latest Ref Rng & Units 03/10/2021 02/17/2021  01/26/2021  WBC 4.0 - 10.5 K/uL 5.3 11.1(H) 11.6(H)  Hemoglobin 12.0 - 15.0 g/dL 13.0 12.5 12.5  Hematocrit 36.0 - 46.0 % 36.8 36.6 36.0  Platelets 150 -  400 K/uL 310 295 321    CMP Latest Ref Rng & Units 03/10/2021 02/17/2021 01/26/2021  Glucose 70 - 99 mg/dL 130(H) 102(H) 119(H)  BUN 8 - 23 mg/dL 6(L) 14 14  Creatinine 0.44 - 1.00 mg/dL 0.68 0.65 0.67  Sodium 135 - 145 mmol/L 143 143 143  Potassium 3.5 - 5.1 mmol/L 3.0(L) 3.4(L) 3.8  Chloride 98 - 111 mmol/L 101 107 105  CO2 22 - 32 mmol/L _0 Calcium 8.9 - 10.3 mg/dL 9.2 9.3 8.9  Total Protein 6.5 - 8.1 g/dL 6.7 6.5 6.5  Total Bilirubin 0.3 - 1.2 mg/dL 0.3 0.3 0.4  Alkaline Phos 38 - 126 U/L 64 55 55  AST 15 - 41 U/L 41 17 20  ALT 0 - 44 U/L 52(H) 28 27     RADIOGRAPHIC STUDIES: I personally have viewed the radiographic studies below: MR BRAIN W WO CONTRAST  Result Date: 03/20/2021 CLINICAL DATA:  65 year old female with metastatic lung cancer. Prior SRS to greater than 10 targets. 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 Restaging. EXAM: MRI HEAD WITHOUT AND WITH CONTRAST TECHNIQUE: Multiplanar, multiecho pulse sequences of the brain and surrounding structures were obtained without and with intravenous contrast. CONTRAST:  23m MULTIHANCE GADOBENATE DIMEGLUMINE 529 MG/ML IV SOLN COMPARISON:  Brain MRI 12/22/2020 and earlier. FINDINGS: Brain: Sequelae of left suboccipital craniotomy and central left cerebellar resection with decreased size of the T2 hyperintense resection cavity since April (series 5, image 7). Regional T2 and FLAIR hyperintensity has also mildly regressed - including the involvement of the left cerebellar peduncle and lateral brainstem. Residual on series 8, image 22, which is partially restricted on diffusion now (series 3, image 51 and series 4, image 12). However, regional and marginal enhancement at the resection  cavity has regressed since last month (T1 space black blood postcontrast imaging now series 11, images 44 and 32). Other heterogeneous and petechial enhancement in the cerebellar vermis and right hemisphere appears similarly regressed (series 11, image 35) also with decreased T2 and FLAIR heterogeneity. Subsequent decreased posterior fossa mass effect compared to April. Heterogeneous T2 signal in the right lateral medulla (series 8, image 17) is stable and without enhancement or abnormal diffusion. The small 4-5 mm nodule of enhancement in the right occipital lobe is regressed (series 11, image 65). Leptomeningeal enhancement in the right parietal lobe is regressed on series 11, image 97. Stable to the smaller 3 mm nodule of enhancement in the anterior left frontal lobe on series 11, image 118. And decreased small nodule of enhancement in the left posterior frontal lobe near the motor strip on series 11, image 122. Smooth generalized dural thickening appears stable since this time last year. Widespread Patchy and confluent bilateral white matter T2 and FLAIR heterogeneity in both hemispheres remains stable. No significant intracranial mass effect now. No restricted diffusion suggestive of acute infarction. No midline shift,ventriculomegaly, extra-axial collection or acute intracranial hemorrhage. Cervicomedullary junction is within normal limits. Pituitary enhancement and size appears stable since 2021. Vascular: Major intracranial vascular flow voids are stable. Skull and upper cervical spine: Visible cervical spine and spinal cord appears stable and negative. Visualized bone marrow signal is within normal limits. Sinuses/Orbits: Stable and negative orbits. Minor paranasal sinus mucosal thickening. Other: Mastoids remain well aerated. Visible internal auditory structures appear normal. IMPRESSION: 1. Regression of abnormal signal, enhancement, and mass effect in the posterior fossa since April. This is felt to be  regression  of multifocal cerebellar radiation necrosis given the negative pathology on surgical resection. But considerable residual heterogeneity in the left cerebellum and cerebellar peduncle. Possible non-enhancing involvement of the right lateral medulla. Recommend continued surveillance. 2. And all small supratentorial enhancing lesions are stable or regressed since April. Stable nonspecific dural thickening. No new or progressive disease. Electronically Signed   By: Genevie Ann M.D.   On: 03/20/2021 09:13   CT CHEST ABDOMEN PELVIS W CONTRAST  Result Date: 03/18/2021 CLINICAL DATA:  Restaging metastatic non-small cell lung cancer. EXAM: CT CHEST, ABDOMEN, AND PELVIS WITH CONTRAST TECHNIQUE: Multidetector CT imaging of the chest, abdomen and pelvis was performed following the standard protocol during bolus administration of intravenous contrast. CONTRAST:  181m OMNIPAQUE IOHEXOL 300 MG/ML  SOLN COMPARISON:  Numerous prior CT scans. The most recent is 12/13/2020 FINDINGS: CT CHEST FINDINGS Cardiovascular: The heart is upper limits of normal in size and stable. Mild left ventricular enlargement. Stable mild tortuosity and ectasia of the thoracic aorta but no focal aneurysm or dissection. Stable scattered atherosclerotic calcifications. No obvious coronary artery calcifications. Mediastinum/Nodes: No mediastinal or hilar mass or lymphadenopathy. Stable matted soft tissue density in the subcarinal region, likely treated disease. The esophagus is grossly normal. Lungs/Pleura: Stable post treatment changes involving the medial aspect of the right lower lobe in the as ago esophageal recess. No findings suspicious for recurrent tumor. Stable underlying emphysematous changes and centrilobular nodularity suggesting respiratory bronchiolitis. There are numerous small scattered solid and ground-glass nodules in both lungs which appears stable. Index right apical ground-glass nodule on image number 38/6 measures 8 mm. 6 mm  ground-glass nodule in the right upper lobe on image number 66/6. 8 mm left lower lobe ground-glass nodule on image number 78/6 New patchy area of ground-glass opacity in the left lower lobe anteriorly at the left lung base is likely atelectasis or inflammation. Musculoskeletal: No significant bony findings. CT ABDOMEN PELVIS FINDINGS Hepatobiliary: No hepatic lesions to suggest metastatic disease. The gallbladder is surgically absent. Mild stable associated common bile duct dilatation. Pancreas: No mass, inflammation or ductal dilatation. Spleen: Normal size. No focal lesions. Adrenals/Urinary Tract: Adrenal glands and kidneys are unremarkable. No renal lesions, renal calculi or hydronephrosis. The bladder is unremarkable. Stomach/Bowel: Stomach, duodenum, small and colon are. No acute inflammatory changes, mass lesions or obstructive findings. The terminal ileum is normal. The appendix is normal. Vascular/Lymphatic: Age advanced atherosclerotic calcifications involving the aorta and branch vessels but no aneurysm or dissection. No mesenteric or retroperitoneal mass or adenopathy. Reproductive: The uterus is surgically absent. Both ovaries are still present and appear normal. Other: No pelvic mass or adenopathy. No free pelvic fluid collections. No inguinal mass or adenopathy. No abdominal wall hernia or subcutaneous lesions. Musculoskeletal: Stable scattered sclerotic bone lesions consistent with treated metastatic disease. Stable vertebral augmentation changes at T8. No new lytic destructive bone lesions. IMPRESSION: 1. Stable post treatment changes involving the medial aspect of the right lower lobe in the azygoesophageal recess. No findings suspicious for recurrent tumor. 2. Stable matted soft tissue density in the subcarinal region, likely treated disease. 3. Stable numerous scattered solid and ground-glass nodules in both lungs. 4. New patchy area of ground-glass opacity in the left lower lobe anteriorly is  likely atelectasis or inflammation. 5. No findings for abdominal/pelvic metastatic disease. 6. Stable scattered sclerotic bone lesions consistent with treated metastatic disease. No new with lytic destructive bone lesions. 7. Age advanced atherosclerotic calcifications involving the thoracic and abdominal aorta and branch vessels. 8. Status post cholecystectomy  with mild stable common bile duct dilatation. 9. Emphysema and aortic atherosclerosis. Aortic Atherosclerosis (ICD10-I70.0) and Emphysema (ICD10-J43.9). Electronically Signed   By: Marijo Sanes M.D.   On: 03/18/2021 09:16     ASSESSMENT & PLAN Virginia [name] 65 y.o. female with medical history significant for metastatic adenocarcinoma of the lung presents for a follow up visit. 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   Previously we discussed Carboplatin/Pembrolizumab/Pemetrexed and the expected side effect from these medications.  We discussed the possible side effects of immunotherapy including colitis, hepatitis, dermatitis, and pneumonitis.  Additionally we discussed the side effects of carboplatin which include suppression of blood counts, nephrotoxicity, and nausea.  Additionally we discussed pemetrexed which can also have effects on counts and would require supplementation with vitamin G64 and folic acid.  She voiced her understanding of these side effects and treatment plans moving forward.  I also noted that we would be able to drop the chemotherapy agents that she had intolerance to continue with pembrolizumab alone given her high TPS score.  The patient has a markedly elevated TPS score (90%)   Ms. Milazzo continues to tolerate the treatment. Her labs from today were reviewed without any intervention needed. Patient will proceed with treatment as planned and return to the clinic in 3 weeks prior to C17.   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 --patient will be followed every 3 weeks with labs and clinic visit. --port in place, patient completed chemotherapy education.  --plan for repeat CT C/A/P in Oct 2022 (q 3 months) --supportive therapy as listed below.  --RTC q 3 weeks for continued monitoring/chemotherapy treatments.    #Nausea/Vomiting -- provided patient with Zofran 4-8mg  q8h PRN and compazine 10mg  q6H for breakthrough PRN. These have not been required since olanzapine was added --continue olanzpaine 5 mg PO QHS to help with nausea. Marked improvement since the addition of this medication on 01/29/2020.  --continue to monitor   #Hypokalemia, stable --likely 2/2 to decreased PO intake, hypovolemia, and BP medications --Potassium level 3.0 today.   --continue to monitor  #Pain Control, stable --continue oxycodone 5mg  q4H PRN. Can increase dose to 10mg  q6H if pain is severe. She is taking approximately 2-3 pills per day.  --continue Xtampza ER BID for long acting support.  --continue to take with Senokot to prevent opioid induced constipation. 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  --nausea as above    #Brain Metastasis, progressing --appreciate the assistance of Dr. 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 --radiation therapy to the brain completed on 11/16/2019.  --MRI brain on 04/29/2020 showed evidence of small brain bleed. Eliquis therapy was discontinued.  --MRI on 11/04/2020 showed concern for progression of intracranial disease.  --On 12/02/2020, patient underwent craniotomy, resection of progressive cerebellar mass by Dr. Vertell Limber. 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  --MRI on 12/23/2020 showed evidence of radiation necrosis. Dr. Mickeal Skinner evaluated the  patient on 01/02/2021 with recommendations to add IV Bevacizumab 10 mg/kg q 3 weeks starting today (01/26/2021). --she has continued dexamethasone 1mg  PO daily.   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    # Hypertension: --BP at today's visit was 133/94. Continue Norvasc to 10 mg daily.  --continue to monitor at home and in clinic.    Orders Placed This Encounter  Procedures   Urine, Protein/Creatinine Ratio    Standing Status:   Standing    Number of Occurrences:   12    Standing Expiration Date:   03/21/2022     All questions were answered. The patient knows to call the clinic with any problems, questions or concerns.  A total of more than 30 minutes were spent on this encounter and over half of that time was spent on counseling and coordination of care as outlined above.   Ledell Peoples, MD Department of Hematology/Oncology [place] at [facility] Phone: [phone] Pager: [phone] Email: [email or web address]   03/21/2021 8:10 PM   Literature Support:  Lorenza Chick, Rodrguez-Abreu D, Duffy Rhody, Felip E, De Angelis F, Domine M, [place], Hochmair MJ, [place], [place], Bischoff HG, [place], Grossi F, [place], Reck M, [place], [place], [place], Rubio-Viqueira B, Novello S, Kurata T, Gray JE, Vida J, Wei Z, Yang J, Raftopoulos H, [place], [place] [employer]; [id number] Investig[employer]. Pembrolizumab plus Chemotherapy in Metastatic Non-Small-Cell Lung Cancer. [place] Med. 2018 May 31;378(22):2078-2092.  --Median progression-free survival was 8.8 months (95% CI, 7.6 to 9.2) in the pembrolizumab-combination group and 4.9 months (95% CI, 4.7 to 5.5) in the placebo-combination group (hazard ratio for disease progression or death, 0.52; 95% CI, 0.43 to 0.64; P<0.001). Adverse events of grade 3 or  higher occurred in 67.2% of the patients in the pembrolizumab-combination group and in 65.8% of those in the placebo-combination group.  Jodene Nam L. Efficacy of pemetrexed-based regimens in advanced non-small cell lung cancer patients with activating epidermal growth factor receptor mutations after tyrosine kinase inhibitor failure: a systematic review. Onco Targets Ther. 2018;11:2121-2129.   --The weighted median PFS, median OS, and ORR for patients treated with pem regimens were 5.09 months, 15.91 months, and 30.19%, respectively. Our systematic review results showed a favorable efficacy profile of pem regimens in NSCLC patients with EGFR mutation after EGFR-TKI failure.

## 2021-03-10 NOTE — Patient Instructions (Signed)
Implanted Port Home Guide An implanted port is a device that is placed under the skin. It is usually placed in the chest. The device can be used to give IV medicine, to take blood, or for dialysis. You may have an implanted port if: You need IV medicine that would be irritating to the small veins in your hands or arms. You need IV medicines, such as antibiotics, for a long period of time. You need IV nutrition for a long period of time. You need dialysis. When you have a port, your health care provider can choose to use the port instead of veins in your arms for these procedures. You may have fewer limitations when using a port than you would if you used other types of long-term IVs, and you will likely be able to return to normal activities afteryour incision heals. An implanted port has two main parts: Reservoir. The reservoir is the part where a needle is inserted to give medicines or draw blood. The reservoir is round. After it is placed, it appears as a small, raised area under your skin. Catheter. The catheter is a thin, flexible tube that connects the reservoir to a vein. Medicine that is inserted into the reservoir goes into the catheter and then into the vein. How is my port accessed? To access your port: A numbing cream may be placed on the skin over the port site. Your health care provider will put on a mask and sterile gloves. The skin over your port will be cleaned carefully with a germ-killing soap and allowed to dry. Your health care provider will gently pinch the port and insert a needle into it. Your health care provider will check for a blood return to make sure the port is in the vein and is not clogged. If your port needs to remain accessed to get medicine continuously (constant infusion), your health care provider will place a clear bandage (dressing) over the needle site. The dressing and needle will need to be changed every week, or as told by your health care provider. What  is flushing? Flushing helps keep the port from getting clogged. Follow instructions from your health care provider about how and when to flush the port. Ports are usually flushed with saline solution or a medicine called heparin. The need for flushing will depend on how the port is used: If the port is only used from time to time to give medicines or draw blood, the port may need to be flushed: Before and after medicines have been given. Before and after blood has been drawn. As part of routine maintenance. Flushing may be recommended every 4-6 weeks. If a constant infusion is running, the port may not need to be flushed. Throw away any syringes in a disposal container that is meant for sharp items (sharps container). You can buy a sharps container from a pharmacy, or you can make one by using an empty hard plastic bottle with a cover. How long will my port stay implanted? The port can stay in for as long as your health care provider thinks it is needed. When it is time for the port to come out, a surgery will be done to remove it. The surgery will be similar to the procedure that was done to putthe port in. Follow these instructions at home:  Flush your port as told by your health care provider. If you need an infusion over several days, follow instructions from your health care provider about how to take   care of your port site. Make sure you: Wash your hands with soap and water before you change your dressing. If soap and water are not available, use alcohol-based hand sanitizer. Change your dressing as told by your health care provider. Place any used dressings or infusion bags into a plastic bag. Throw that bag in the trash. Keep the dressing that covers the needle clean and dry. Do not get it wet. Do not use scissors or sharp objects near the tube. Keep the tube clamped, unless it is being used. Check your port site every day for signs of infection. Check for: Redness, swelling, or  pain. Fluid or blood. Pus or a bad smell. Protect the skin around the port site. Avoid wearing bra straps that rub or irritate the site. Protect the skin around your port from seat belts. Place a soft pad over your chest if needed. Bathe or shower as told by your health care provider. The site may get wet as long as you are not actively receiving an infusion. Return to your normal activities as told by your health care provider. Ask your health care provider what activities are safe for you. Carry a medical alert card or wear a medical alert bracelet at all times. This will let health care providers know that you have an implanted port in case of an emergency. Get help right away if: You have redness, swelling, or pain at the port site. You have fluid or blood coming from your port site. You have pus or a bad smell coming from the port site. You have a fever. Summary Implanted ports are usually placed in the chest for long-term IV access. Follow instructions from your health care provider about flushing the port and changing bandages (dressings). Take care of the area around your port by avoiding clothing that puts pressure on the area, and by watching for signs of infection. Protect the skin around your port from seat belts. Place a soft pad over your chest if needed. Get help right away if you have a fever or you have redness, swelling, pain, drainage, or a bad smell at the port site. This information is not intended to replace advice given to you by your health care provider. Make sure you discuss any questions you have with your healthcare provider. Document Revised: 01/11/2020 Document Reviewed: 01/11/2020 [employer] Patient Education  2022 [employer]  

## 2021-03-14 ENCOUNTER — Other Ambulatory Visit: Payer: Self-pay | Admitting: Hematology and Oncology

## 2021-03-14 ENCOUNTER — Other Ambulatory Visit (HOSPITAL_COMMUNITY): Payer: Self-pay

## 2021-03-14 MED ORDER — FUROSEMIDE 20 MG PO TABS
20.0000 mg | ORAL_TABLET | Freq: Every day | ORAL | 0 refills | Status: DC
  Filled 2021-03-14 – 2021-03-15 (×2): qty 30, 30d supply, fill #0

## 2021-03-15 ENCOUNTER — Other Ambulatory Visit: Payer: Self-pay | Admitting: Hematology and Oncology

## 2021-03-15 ENCOUNTER — Other Ambulatory Visit (HOSPITAL_COMMUNITY): Payer: Self-pay

## 2021-03-15 ENCOUNTER — Telehealth: Payer: Self-pay | Admitting: *Deleted

## 2021-03-15 MED ORDER — XTAMPZA ER 13.5 MG PO C12A
1.0000 | EXTENDED_RELEASE_CAPSULE | Freq: Two times a day (BID) | ORAL | 0 refills | Status: DC
  Filled 2021-03-15: qty 60, 30d supply, fill #0

## 2021-03-15 MED ORDER — OXYCODONE HCL 5 MG PO TABS
ORAL_TABLET | ORAL | 0 refills | Status: DC
  Filled 2021-03-15: qty 90, 15d supply, fill #0

## 2021-03-15 NOTE — Telephone Encounter (Signed)
Received vm message from pt's husband for a refill on her Xtampza and her oxycodone 5mg . Both were refilled last on 02/13/21 @ [facility]

## 2021-03-16 ENCOUNTER — Other Ambulatory Visit (HOSPITAL_COMMUNITY): Payer: Self-pay

## 2021-03-17 ENCOUNTER — Encounter: Payer: Self-pay | Admitting: Internal Medicine

## 2021-03-17 ENCOUNTER — Encounter: Payer: Self-pay | Admitting: Hematology and Oncology

## 2021-03-17 ENCOUNTER — Other Ambulatory Visit: Payer: Self-pay

## 2021-03-17 ENCOUNTER — Ambulatory Visit (HOSPITAL_COMMUNITY)
Admission: RE | Admit: 2021-03-17 | Discharge: 2021-03-17 | Disposition: A | Discharge status: Home / Self Care | Payer: BC Managed Care – PPO | Source: Ambulatory Visit | Attending: Hematology and Oncology | Admitting: Hematology and Oncology

## 2021-03-17 ENCOUNTER — Ambulatory Visit
Admission: RE | Admit: 2021-03-17 | Discharge: 2021-03-17 | Disposition: A | Discharge status: Home / Self Care | Payer: BC Managed Care – PPO | Source: Ambulatory Visit | Attending: Internal Medicine | Admitting: Internal Medicine

## 2021-03-17 ENCOUNTER — Encounter (HOSPITAL_COMMUNITY): Payer: Self-pay

## 2021-03-17 DIAGNOSIS — C7951 Secondary malignant neoplasm of bone: Secondary | ICD-10-CM

## 2021-03-17 DIAGNOSIS — I2510 Atherosclerotic heart disease of native coronary artery without angina pectoris: Secondary | ICD-10-CM | POA: Diagnosis not present

## 2021-03-17 DIAGNOSIS — I700 Atherosclerosis of aorta: Secondary | ICD-10-CM | POA: Diagnosis not present

## 2021-03-17 DIAGNOSIS — C3490 Malignant neoplasm of unspecified part of unspecified bronchus or lung: Secondary | ICD-10-CM | POA: Diagnosis not present

## 2021-03-17 DIAGNOSIS — K838 Other specified diseases of biliary tract: Secondary | ICD-10-CM | POA: Diagnosis not present

## 2021-03-17 DIAGNOSIS — J3489 Other specified disorders of nose and nasal sinuses: Secondary | ICD-10-CM | POA: Diagnosis not present

## 2021-03-17 DIAGNOSIS — C7931 Secondary malignant neoplasm of brain: Secondary | ICD-10-CM

## 2021-03-17 DIAGNOSIS — I77810 Thoracic aortic ectasia: Secondary | ICD-10-CM | POA: Diagnosis not present

## 2021-03-17 DIAGNOSIS — I6789 Other cerebrovascular disease: Secondary | ICD-10-CM

## 2021-03-17 MED ORDER — GADOBENATE DIMEGLUMINE 529 MG/ML IV SOLN
14.0000 mL | Freq: Once | INTRAVENOUS | Status: AC | PRN
  Administered 2021-03-17: 14 mL via INTRAVENOUS

## 2021-03-17 MED ORDER — IOHEXOL 300 MG/ML  SOLN
100.0000 mL | Freq: Once | INTRAMUSCULAR | Status: AC | PRN
  Administered 2021-03-17: 100 mL via INTRAVENOUS

## 2021-03-17 MED ORDER — SODIUM CHLORIDE (PF) 0.9 % IJ SOLN
INTRAMUSCULAR | Status: AC
  Filled 2021-03-17: qty 50

## 2021-03-20 ENCOUNTER — Inpatient Hospital Stay: Payer: BC Managed Care – PPO

## 2021-03-20 ENCOUNTER — Ambulatory Visit: Payer: BC Managed Care – PPO | Admitting: Internal Medicine

## 2021-03-20 DIAGNOSIS — S22000G Wedge compression fracture of unspecified thoracic vertebra, subsequent encounter for fracture with delayed healing: Secondary | ICD-10-CM | POA: Diagnosis not present

## 2021-03-20 DIAGNOSIS — M546 Pain in thoracic spine: Secondary | ICD-10-CM | POA: Diagnosis not present

## 2021-03-20 DIAGNOSIS — C7951 Secondary malignant neoplasm of bone: Secondary | ICD-10-CM | POA: Diagnosis not present

## 2021-03-20 DIAGNOSIS — C7931 Secondary malignant neoplasm of brain: Secondary | ICD-10-CM | POA: Diagnosis not present

## 2021-03-21 ENCOUNTER — Encounter: Payer: Self-pay | Admitting: Internal Medicine

## 2021-03-21 ENCOUNTER — Other Ambulatory Visit: Payer: Self-pay

## 2021-03-21 ENCOUNTER — Inpatient Hospital Stay (HOSPITAL_BASED_OUTPATIENT_CLINIC_OR_DEPARTMENT_OTHER): Payer: BC Managed Care – PPO | Admitting: Internal Medicine

## 2021-03-21 ENCOUNTER — Other Ambulatory Visit (HOSPITAL_COMMUNITY): Payer: Self-pay

## 2021-03-21 ENCOUNTER — Encounter: Payer: Self-pay | Admitting: Hematology and Oncology

## 2021-03-21 VITALS — BP 140/91 | HR 108 | Temp 98.1°F | Resp 18 | Ht 62.0 in | Wt 152.8 lb

## 2021-03-21 DIAGNOSIS — Z5112 Encounter for antineoplastic immunotherapy: Secondary | ICD-10-CM | POA: Diagnosis not present

## 2021-03-21 DIAGNOSIS — Z86718 Personal history of other venous thrombosis and embolism: Secondary | ICD-10-CM | POA: Diagnosis not present

## 2021-03-21 DIAGNOSIS — I10 Essential (primary) hypertension: Secondary | ICD-10-CM | POA: Diagnosis not present

## 2021-03-21 DIAGNOSIS — R233 Spontaneous ecchymoses: Secondary | ICD-10-CM | POA: Diagnosis not present

## 2021-03-21 DIAGNOSIS — C7931 Secondary malignant neoplasm of brain: Secondary | ICD-10-CM | POA: Diagnosis not present

## 2021-03-21 DIAGNOSIS — Z5111 Encounter for antineoplastic chemotherapy: Secondary | ICD-10-CM | POA: Diagnosis not present

## 2021-03-21 DIAGNOSIS — G939 Disorder of brain, unspecified: Secondary | ICD-10-CM | POA: Diagnosis not present

## 2021-03-21 DIAGNOSIS — Z79899 Other long term (current) drug therapy: Secondary | ICD-10-CM | POA: Diagnosis not present

## 2021-03-21 DIAGNOSIS — J432 Centrilobular emphysema: Secondary | ICD-10-CM | POA: Diagnosis not present

## 2021-03-21 DIAGNOSIS — Z923 Personal history of irradiation: Secondary | ICD-10-CM | POA: Diagnosis not present

## 2021-03-21 DIAGNOSIS — C3431 Malignant neoplasm of lower lobe, right bronchus or lung: Secondary | ICD-10-CM | POA: Diagnosis not present

## 2021-03-21 DIAGNOSIS — I700 Atherosclerosis of aorta: Secondary | ICD-10-CM | POA: Diagnosis not present

## 2021-03-21 DIAGNOSIS — Z7901 Long term (current) use of anticoagulants: Secondary | ICD-10-CM | POA: Diagnosis not present

## 2021-03-21 DIAGNOSIS — R112 Nausea with vomiting, unspecified: Secondary | ICD-10-CM | POA: Diagnosis not present

## 2021-03-21 DIAGNOSIS — M7989 Other specified soft tissue disorders: Secondary | ICD-10-CM | POA: Diagnosis not present

## 2021-03-21 DIAGNOSIS — K838 Other specified diseases of biliary tract: Secondary | ICD-10-CM | POA: Diagnosis not present

## 2021-03-21 MED ORDER — HYDROCORTISONE 10 MG PO TABS
20.0000 mg | ORAL_TABLET | Freq: Every day | ORAL | 1 refills | Status: DC
  Filled 2021-03-21: qty 90, 45d supply, fill #0

## 2021-03-21 NOTE — Progress Notes (Signed)
[facility] [place], [place] [address] [phone]   Interval Evaluation  Date of Service: 03/21/21 Patient Name: Virginia [name] Patient MRN: [id number] Patient DOB: 04-Oct-1955 Provider: Ventura Sellers, MD  Identifying Statement:  Virginia [name] is a 65 y.o. 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 Isidore Moos 12/02/20: Craniotomy, resection of progressive cerebellar mass by Dr. Vertell Limber.  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  She continues to experience dizziness and balance issues which require use of walker at times.  Around the home she gets around independently.  She otherwise describes no new or progressive neurologic deficits.  No seizures or headaches.  Currently decadron is at 111m but doses less than daily because of bouts of lower leg swelling associated with it.  She continues on chemotherapy well with Dr. DoLorenso Courier H+P (10/15/19) Patient presents to clinic to discuss recent neurologic symptoms and imaging findings.  She describes a fall while climbing stairs on 1/20; she hit the back of her and lost consciousness.  She is unclear whether she lost her balance or had mechanical provocation, but she had been drinking that evening.  She was treated initially as a trauma, CT demonstrated a lesion in the posterior fossa, later confirmed as a metastasis by MRI, along with 7 additional smaller lesions.  It took her almost two weeks to return for cognition and balance to return to "normal" which she feels she is about at right now.  Baseline she is high functioning and independent, works as a buNeurosurgeon Has been taking decadron [id number] per day.  Plans to initiate chemotherapy soon with Dr. DorLorenso CourierMedications: Current Outpatient Medications on File Prior to Visit  Medication Sig Dispense Refill   acetaminophen (TYLENOL) 500 MG tablet Take 1,000 mg by mouth every 8 (eight) hours as needed for moderate pain.     albuterol (VENTOLIN HFA) 108 (90 Base) MCG/ACT inhaler INHALE 2 PUFFS INTO THE LUNGS EVERY 6 HOURS AS NEEDED FOR WHEEZING OR SHORTNESS OF BREATH (Patient taking differently: Inhale 2 puffs into the lungs every 6 (six) hours as needed for shortness of breath or wheezing.) 6.7 g 11   ALPRAZolam (XANAX) 0.25 MG tablet Take 1 tablet (0.25 mg total) by mouth 2 (two) times daily as needed for anxiety 60 tablet 5   amLODipine (NORVASC) 10 MG tablet Take 1 tablet (10 mg total) by mouth daily. 30 tablet 3   Cyanocobalamin (VITAMIN B12 PO) Take 2 tablets by mouth daily. Gummies     dexamethasone (DECADRON) 1 MG tablet Take 2 tablets (2 mg total) by mouth daily. 60 tablet 1   folic acid (FOLVITE) 1 MG tablet Take 1 tablet by mouth daily. 90 tablet 1   furosemide (LASIX) 20 MG tablet Take 1 tablet (20 mg total) by mouth daily. 30 tablet 0   lidocaine-prilocaine (EMLA) cream Apply to port as needed. 30 g 0   Multiple Vitamins-Minerals (AIRBORNE PO) Take 1 tablet by mouth daily.      ofloxacin (OCUFLOX) 0.3 % ophthalmic solution 2 drops each eye 4 times a day for 5 days (Patient taking differently: Place 2 drops into both eyes daily as needed (irritation).) 5  mL 1   OLANZapine (ZYPREXA) 2.5 MG tablet TAKE 1 TABLET BY MOUTH EVERY NIGHT AT BEDTIME 90 tablet 1   ondansetron (ZOFRAN) 4 MG tablet TAKE 1 TO 2 TABLETS BY MOUTH 2 TIMES DAILY AS NEEDED (Patient not taking: Reported on 01/02/2021) 60 tablet 0   oxyCODONE (OXY IR/ROXICODONE) 5 MG immediate release tablet TAKE 1 TO 2 TABLETS BY MOUTH EVERY 4 HOURS AS NEEDED FOR SEVERE PAIN 90 tablet 0   pantoprazole (PROTONIX) 40 MG tablet TAKE ONE TABLET BY MOUTH DAILY (Patient taking  differently: Take 40 mg by mouth daily.) 90 tablet 3   pentoxifylline (TRENTAL) 400 MG CR tablet TAKE 1 TABLET BY MOUTH WITH FOOD FOR 1 WEEK THEN TAKE 1 TABLET BY MOUTH 2 TIMES DAILY THEREAFTER 60 tablet 2   polyethylene glycol (MIRALAX / GLYCOLAX) 17 g packet Take 17 g by mouth 2 (two) times daily.      promethazine (PHENERGAN) 12.5 MG tablet TAKE 1 TO 2 TABLETS BY MOUTH EVERY 4 HOURS AS NEEDED FOR REFRACTORY NAUSEA OR VOMITING 30 tablet 0   Vitamin D3 (VITAMIN D) 25 MCG tablet Take 2,000 Units by mouth daily.     vitamin E 180 MG (400 UNITS) capsule Take 400 IU daily x 1 week, then 400 IU BID (Patient taking differently: Take 400 Units by mouth in the morning and at bedtime. Take 400 IU daily x 1 week, then 400 IU BID) 60 capsule 4   XTAMPZA ER 13.5 MG C12A Take 1 capsule by mouth in the morning and at bedtime. (every 12 hours) 60 capsule 0   No current facility-administered medications on file prior to visit.    Allergies: No Known Allergies Past Medical History:  Past Medical History:  Diagnosis Date   Anemia    Anxiety    Concussion 09/28/2019   DVT (deep venous thrombosis) ([facility]) 2021   L leg   Dyspnea    GERD (gastroesophageal reflux disease)    Hypercholesterolemia    per pt, she does not have elevated lipids   Hypertension    met lung ca dx'd 09/2019   mets to spine, hip and brain   PONV (postoperative nausea and vomiting)    Tobacco abuse    Past Surgical History:  Past Surgical History:  Procedure Laterality Date   ABDOMINAL HYSTERECTOMY     partial/ left ovaries   APPLICATION OF CRANIAL NAVIGATION N/A 12/02/2020   Procedure: APPLICATION OF CRANIAL NAVIGATION;  Surgeon: Erline Levine, MD;  Location: [facility];  Service: Neurosurgery;  Laterality: N/A;   CHOLECYSTECTOMY     CRANIOTOMY N/A 12/02/2020   Procedure: Posterior fossa craniotomy for tumor resection with brainlab;  Surgeon: Erline Levine, MD;  Location: [facility];  Service: Neurosurgery;  Laterality: N/A;   DILATION AND  CURETTAGE OF UTERUS     IR IMAGING GUIDED PORT INSERTION  10/23/2019   KYPHOPLASTY N/A 03/15/2020   Procedure: Thoracic Eight KYPHOPLASTY;  Surgeon: Erline Levine, MD;  Location: [facility];  Service: Neurosurgery;  Laterality: N/A;  prone    LIPOMA EXCISION  2018   removed under left breast and right thigh.   TUBAL LIGATION     Social History:  Social History   Socioeconomic History   Marital status: Married    Spouse name: Not on file   Number of children: 3   Years of education: Not on file   Highest education level: Not on file  Occupational History   Occupation: owns Medical illustrator: [employer]  Tobacco Use   Smoking status: Former    Packs/day: 0.25    Pack years: 0.00    Types: Cigarettes    Quit date: 10/31/2012    Years since quitting: 8.3   Smokeless tobacco: Never  Vaping Use   Vaping Use: Never used  Substance and Sexual Activity   Alcohol use: Yes    Alcohol/week: 0.0 standard drinks    Comment: rare   Drug use: No   Sexual activity: Yes  Other Topics Concern   Not on file  Social History Narrative   Not on file   Social Determinants of Health   Financial Resource Strain: Not on file  Food Insecurity: Not on file  Transportation Needs: Not on file  Physical Activity: Not on file  Stress: Not on file  Social Connections: Not on file  Intimate Partner Violence: Not on file   Family History:  Family History  Problem Relation Age of Onset   Heart disease Father    Drug abuse Daughter    Drug abuse Son    Cancer Sister    Heart disease Brother    Heart attack Brother     Review of Systems: Constitutional: Doesn't report fevers, chills or abnormal weight loss Eyes: Doesn't report blurriness of vision Ears, nose, mouth, throat, and face: Doesn't report sore throat Respiratory: Doesn't report cough, dyspnea or wheezes Cardiovascular: Doesn't report palpitation, chest discomfort  Gastrointestinal:  Doesn't report nausea, constipation,  diarrhea GU: Doesn't report incontinence Skin: Doesn't report skin rashes Neurological: Per HPI Musculoskeletal: Doesn't report joint pain Behavioral/Psych: Doesn't report anxiety  Physical Exam: Vitals:   03/21/21 1205  BP: (!) 140/91  Pulse: (!) 108  Resp: 18  Temp: 98.1 F (36.7 C)  SpO2: 99%   KPS: 90. General: Alert, cooperative, pleasant, in no acute distress Head: Normal EENT: No conjunctival injection or scleral icterus.  Lungs: Resp effort normal Cardiac: Regular rate Abdomen: Non-distended abdomen Skin: No rashes cyanosis or petechiae. Extremities: No clubbing or edema  Neurologic Exam: Mental Status: Awake, alert, attentive to examiner. Oriented to self and environment. Language is fluent with intact comprehension.  Cranial Nerves: Visual acuity is grossly normal. Visual fields are full. Extra-ocular movements intact. No ptosis. Face is symmetric Motor: Tone and bulk are normal. Power is full in both arms and legs. Reflexes are symmetric, no pathologic reflexes present.  Sensory: Intact to light touch Gait: Moderate impairment to tandem gait only  Labs: I have reviewed the data as listed    Component Value Date/Time   NA 143 03/10/2021 1012   K 3.0 (L) 03/10/2021 1012   CL 101 03/10/2021 1012   CO2 31 03/10/2021 1012   GLUCOSE 130 (H) 03/10/2021 1012   BUN 6 (L) 03/10/2021 1012   CREATININE 0.68 03/10/2021 1012   CREATININE 0.58 11/15/2017 0948   CALCIUM 9.2 03/10/2021 1012   PROT 6.7 03/10/2021 1012   ALBUMIN 3.1 (L) 03/10/2021 1012   AST 41 03/10/2021 1012   ALT 52 (H) 03/10/2021 1012   ALKPHOS 64 03/10/2021 1012   BILITOT 0.3 03/10/2021 1012   GFRNONAA >60 03/10/2021 1012   GFRNONAA 100 11/15/2017 0948   GFRAA >60 06/09/2020 1051   GFRAA 116 11/15/2017 0948   Lab Results  Component Value Date   WBC 5.3 03/10/2021   NEUTROABS 2.4 03/10/2021   HGB 13.0 03/10/2021   HCT 36.8 03/10/2021   MCV 97.1 03/10/2021   PLT 310 03/10/2021     Imaging:  [facility] Clinician  Interpretation: I have personally reviewed the CNS images as listed.  My interpretation, in the context of the patient's clinical presentation, is stable disease  MR BRAIN W WO CONTRAST  Result Date: 03/20/2021 CLINICAL DATA:  65 year old female with metastatic lung cancer. Prior SRS to greater than 10 targets. 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 Restaging. EXAM: MRI HEAD WITHOUT AND WITH CONTRAST TECHNIQUE: Multiplanar, multiecho pulse sequences of the brain and surrounding structures were obtained without and with intravenous contrast. CONTRAST:  67m MULTIHANCE GADOBENATE DIMEGLUMINE 529 MG/ML IV SOLN COMPARISON:  Brain MRI 12/22/2020 and earlier. FINDINGS: Brain: Sequelae of left suboccipital craniotomy and central left cerebellar resection with decreased size of the T2 hyperintense resection cavity since April (series 5, image 7). Regional T2 and FLAIR hyperintensity has also mildly regressed - including the involvement of the left cerebellar peduncle and lateral brainstem. Residual on series 8, image 22, which is partially restricted on diffusion now (series 3, image 51 and series 4, image 12). However, regional and marginal enhancement at the resection cavity has regressed since last month (T1 space black blood postcontrast imaging now series 11, images 44 and 32). Other heterogeneous and petechial enhancement in the cerebellar vermis and right hemisphere appears similarly regressed (series 11, image 35) also with decreased T2 and FLAIR heterogeneity. Subsequent decreased posterior fossa mass effect compared to April. Heterogeneous T2 signal in the right lateral medulla (series 8, image 17) is stable and without enhancement or abnormal diffusion. The small 4-5 mm nodule of enhancement in the right occipital lobe is regressed (series 11, image 65). Leptomeningeal  enhancement in the right parietal lobe is regressed on series 11, image 97. Stable to the smaller 3 mm nodule of enhancement in the anterior left frontal lobe on series 11, image 118. And decreased small nodule of enhancement in the left posterior frontal lobe near the motor strip on series 11, image 122. Smooth generalized dural thickening appears stable since this time last year. Widespread Patchy and confluent bilateral white matter T2 and FLAIR heterogeneity in both hemispheres remains stable. No significant intracranial mass effect now. No restricted diffusion suggestive of acute infarction. No midline shift,ventriculomegaly, extra-axial collection or acute intracranial hemorrhage. Cervicomedullary junction is within normal limits. Pituitary enhancement and size appears stable since 2021. Vascular: Major intracranial vascular flow voids are stable. Skull and upper cervical spine: Visible cervical spine and spinal cord appears stable and negative. Visualized bone marrow signal is within normal limits. Sinuses/Orbits: Stable and negative orbits. Minor paranasal sinus mucosal thickening. Other: Mastoids remain well aerated. Visible internal auditory structures appear normal. IMPRESSION: 1. Regression of abnormal signal, enhancement, and mass effect in the posterior fossa since April. This is felt to be regression of multifocal cerebellar radiation necrosis given the negative pathology on surgical resection. But considerable residual heterogeneity in the left cerebellum and cerebellar peduncle. Possible non-enhancing involvement of the right lateral medulla. Recommend continued surveillance. 2. And all small supratentorial enhancing lesions are stable or regressed since April. Stable nonspecific dural thickening. No new or progressive disease. Electronically Signed   By: HGenevie AnnM.D.   On: 03/20/2021 09:13   CT CHEST ABDOMEN PELVIS W CONTRAST  Result Date: 03/18/2021 CLINICAL DATA:  Restaging metastatic  non-small cell lung cancer. EXAM: CT CHEST, ABDOMEN, AND PELVIS WITH CONTRAST TECHNIQUE: Multidetector CT imaging of the chest, abdomen and pelvis was performed following the standard protocol during bolus administration of intravenous contrast. CONTRAST:  1084mOMNIPAQUE IOHEXOL  300 MG/ML  SOLN COMPARISON:  Numerous prior CT scans. The most recent is 12/13/2020 FINDINGS: CT CHEST FINDINGS Cardiovascular: The heart is upper limits of normal in size and stable. Mild left ventricular enlargement. Stable mild tortuosity and ectasia of the thoracic aorta but no focal aneurysm or dissection. Stable scattered atherosclerotic calcifications. No obvious coronary artery calcifications. Mediastinum/Nodes: No mediastinal or hilar mass or lymphadenopathy. Stable matted soft tissue density in the subcarinal region, likely treated disease. The esophagus is grossly normal. Lungs/Pleura: Stable post treatment changes involving the medial aspect of the right lower lobe in the as ago esophageal recess. No findings suspicious for recurrent tumor. Stable underlying emphysematous changes and centrilobular nodularity suggesting respiratory bronchiolitis. There are numerous small scattered solid and ground-glass nodules in both lungs which appears stable. Index right apical ground-glass nodule on image number 38/6 measures 8 mm. 6 mm ground-glass nodule in the right upper lobe on image number 66/6. 8 mm left lower lobe ground-glass nodule on image number 78/6 New patchy area of ground-glass opacity in the left lower lobe anteriorly at the left lung base is likely atelectasis or inflammation. Musculoskeletal: No significant bony findings. CT ABDOMEN PELVIS FINDINGS Hepatobiliary: No hepatic lesions to suggest metastatic disease. The gallbladder is surgically absent. Mild stable associated common bile duct dilatation. Pancreas: No mass, inflammation or ductal dilatation. Spleen: Normal size. No focal lesions. Adrenals/Urinary Tract: Adrenal  glands and kidneys are unremarkable. No renal lesions, renal calculi or hydronephrosis. The bladder is unremarkable. Stomach/Bowel: Stomach, duodenum, small and colon are. No acute inflammatory changes, mass lesions or obstructive findings. The terminal ileum is normal. The appendix is normal. Vascular/Lymphatic: Age advanced atherosclerotic calcifications involving the aorta and branch vessels but no aneurysm or dissection. No mesenteric or retroperitoneal mass or adenopathy. Reproductive: The uterus is surgically absent. Both ovaries are still present and appear normal. Other: No pelvic mass or adenopathy. No free pelvic fluid collections. No inguinal mass or adenopathy. No abdominal wall hernia or subcutaneous lesions. Musculoskeletal: Stable scattered sclerotic bone lesions consistent with treated metastatic disease. Stable vertebral augmentation changes at T8. No new lytic destructive bone lesions. IMPRESSION: 1. Stable post treatment changes involving the medial aspect of the right lower lobe in the azygoesophageal recess. No findings suspicious for recurrent tumor. 2. Stable matted soft tissue density in the subcarinal region, likely treated disease. 3. Stable numerous scattered solid and ground-glass nodules in both lungs. 4. New patchy area of ground-glass opacity in the left lower lobe anteriorly is likely atelectasis or inflammation. 5. No findings for abdominal/pelvic metastatic disease. 6. Stable scattered sclerotic bone lesions consistent with treated metastatic disease. No new with lytic destructive bone lesions. 7. Age advanced atherosclerotic calcifications involving the thoracic and abdominal aorta and branch vessels. 8. Status post cholecystectomy with mild stable common bile duct dilatation. 9. Emphysema and aortic atherosclerosis. Aortic Atherosclerosis (ICD10-I70.0) and Emphysema (ICD10-J43.9). Electronically Signed   By: Marijo Sanes M.D.   On: 03/18/2021 09:16     Assessment/Plan Brain  metastases ([facility])   Ms. Crocket is clinically and radiographically stable today, followng 3 cycles of Avastin for radionecrosis refractory to corticosteroids.  Imaging demonstrates significant improvement in burden of enhancing disease within posterior fossa.    She continues to be poorly tolerant of decadron, but also experiences decompensation of gait when she stops it.  We recommended transition to hydrocortisone as follow: -20m daily x7 days -Then 174mdaily x7 days -Then 1084mOD or stop if tolerated  We appreciate the opportunity to participate in  the care of Virginia [name].   We ask that Virginia [name] return to clinic in 2-3 months following next brain MRI, or sooner as needed.  All questions were answered. The patient knows to call the clinic with any problems, questions or concerns. No barriers to learning were detected.  The total time spent in the encounter was 40 minutes and more than 50% was on counseling and review of test results   Ventura Sellers, MD Medical Director of Neuro-Oncology [facility] [place] 03/21/21 12:08 PM

## 2021-03-22 ENCOUNTER — Other Ambulatory Visit (HOSPITAL_COMMUNITY): Payer: Self-pay

## 2021-03-22 ENCOUNTER — Telehealth: Payer: Self-pay | Admitting: Hematology and Oncology

## 2021-03-22 NOTE — Telephone Encounter (Signed)
Scheduled appointments per 07/12 sch msg. Patient will receive updated calender.

## 2021-03-23 ENCOUNTER — Ambulatory Visit: Payer: BC Managed Care – PPO | Admitting: Internal Medicine

## 2021-03-24 ENCOUNTER — Telehealth: Payer: Self-pay | Admitting: Internal Medicine

## 2021-03-24 NOTE — Telephone Encounter (Signed)
Scheduled per 07/14 los, spoke with patient's spouse and patient will be notified of upcoming appointments.

## 2021-03-31 ENCOUNTER — Inpatient Hospital Stay: Payer: BC Managed Care – PPO

## 2021-03-31 ENCOUNTER — Inpatient Hospital Stay (HOSPITAL_BASED_OUTPATIENT_CLINIC_OR_DEPARTMENT_OTHER): Payer: BC Managed Care – PPO | Admitting: Hematology and Oncology

## 2021-03-31 ENCOUNTER — Other Ambulatory Visit: Payer: Self-pay | Admitting: Hematology and Oncology

## 2021-03-31 ENCOUNTER — Other Ambulatory Visit: Payer: Self-pay

## 2021-03-31 VITALS — BP 130/103 | HR 105 | Temp 97.2°F | Resp 19 | Ht 62.0 in | Wt 152.2 lb

## 2021-03-31 VITALS — BP 147/103 | HR 105

## 2021-03-31 DIAGNOSIS — Z5112 Encounter for antineoplastic immunotherapy: Secondary | ICD-10-CM | POA: Diagnosis not present

## 2021-03-31 DIAGNOSIS — C7931 Secondary malignant neoplasm of brain: Secondary | ICD-10-CM

## 2021-03-31 DIAGNOSIS — G939 Disorder of brain, unspecified: Secondary | ICD-10-CM | POA: Diagnosis not present

## 2021-03-31 DIAGNOSIS — R233 Spontaneous ecchymoses: Secondary | ICD-10-CM | POA: Diagnosis not present

## 2021-03-31 DIAGNOSIS — I10 Essential (primary) hypertension: Secondary | ICD-10-CM | POA: Diagnosis not present

## 2021-03-31 DIAGNOSIS — Z95828 Presence of other vascular implants and grafts: Secondary | ICD-10-CM

## 2021-03-31 DIAGNOSIS — Z7901 Long term (current) use of anticoagulants: Secondary | ICD-10-CM | POA: Diagnosis not present

## 2021-03-31 DIAGNOSIS — R918 Other nonspecific abnormal finding of lung field: Secondary | ICD-10-CM

## 2021-03-31 DIAGNOSIS — C3431 Malignant neoplasm of lower lobe, right bronchus or lung: Secondary | ICD-10-CM | POA: Diagnosis not present

## 2021-03-31 DIAGNOSIS — C3490 Malignant neoplasm of unspecified part of unspecified bronchus or lung: Secondary | ICD-10-CM

## 2021-03-31 DIAGNOSIS — Z5111 Encounter for antineoplastic chemotherapy: Secondary | ICD-10-CM | POA: Diagnosis not present

## 2021-03-31 DIAGNOSIS — C7951 Secondary malignant neoplasm of bone: Secondary | ICD-10-CM

## 2021-03-31 DIAGNOSIS — M7989 Other specified soft tissue disorders: Secondary | ICD-10-CM | POA: Diagnosis not present

## 2021-03-31 DIAGNOSIS — J432 Centrilobular emphysema: Secondary | ICD-10-CM | POA: Diagnosis not present

## 2021-03-31 DIAGNOSIS — K838 Other specified diseases of biliary tract: Secondary | ICD-10-CM | POA: Diagnosis not present

## 2021-03-31 DIAGNOSIS — I700 Atherosclerosis of aorta: Secondary | ICD-10-CM | POA: Diagnosis not present

## 2021-03-31 DIAGNOSIS — Z923 Personal history of irradiation: Secondary | ICD-10-CM | POA: Diagnosis not present

## 2021-03-31 DIAGNOSIS — Z79899 Other long term (current) drug therapy: Secondary | ICD-10-CM | POA: Diagnosis not present

## 2021-03-31 DIAGNOSIS — R112 Nausea with vomiting, unspecified: Secondary | ICD-10-CM | POA: Diagnosis not present

## 2021-03-31 DIAGNOSIS — Z86718 Personal history of other venous thrombosis and embolism: Secondary | ICD-10-CM | POA: Diagnosis not present

## 2021-03-31 LAB — CBC WITH DIFFERENTIAL (CANCER CENTER ONLY)
Abs Immature Granulocytes: 0.05 10*3/uL (ref 0.00–0.07)
Basophils Absolute: 0 10*3/uL (ref 0.0–0.1)
Basophils Relative: 0 %
Eosinophils Absolute: 0.1 10*3/uL (ref 0.0–0.5)
Eosinophils Relative: 1 %
HCT: 38.3 % (ref 36.0–46.0)
Hemoglobin: 13.4 g/dL (ref 12.0–15.0)
Immature Granulocytes: 1 %
Lymphocytes Relative: 15 %
Lymphs Abs: 1.4 10*3/uL (ref 0.7–4.0)
MCH: 34.3 pg — ABNORMAL HIGH (ref 26.0–34.0)
MCHC: 35 g/dL (ref 30.0–36.0)
MCV: 98 fL (ref 80.0–100.0)
Monocytes Absolute: 1.2 10*3/uL — ABNORMAL HIGH (ref 0.1–1.0)
Monocytes Relative: 13 %
Neutro Abs: 6.5 10*3/uL (ref 1.7–7.7)
Neutrophils Relative %: 70 %
Platelet Count: 296 10*3/uL (ref 150–400)
RBC: 3.91 MIL/uL (ref 3.87–5.11)
RDW: 13.3 % (ref 11.5–15.5)
WBC Count: 9.3 10*3/uL (ref 4.0–10.5)
nRBC: 0 % (ref 0.0–0.2)

## 2021-03-31 LAB — CMP (CANCER CENTER ONLY)
ALT: 30 U/L (ref 0–44)
AST: 29 U/L (ref 15–41)
Albumin: 3.5 g/dL (ref 3.5–5.0)
Alkaline Phosphatase: 54 U/L (ref 38–126)
Anion gap: 12 (ref 5–15)
BUN: 10 mg/dL (ref 8–23)
CO2: 30 mmol/L (ref 22–32)
Calcium: 9.5 mg/dL (ref 8.9–10.3)
Chloride: 101 mmol/L (ref 98–111)
Creatinine: 0.69 mg/dL (ref 0.44–1.00)
GFR, Estimated: >60 mL/min (ref >60)
Glucose, Bld: 130 mg/dL — ABNORMAL HIGH (ref 70–99)
Potassium: 3.3 mmol/L — ABNORMAL LOW (ref 3.5–5.1)
Sodium: 143 mmol/L (ref 135–145)
Total Bilirubin: 0.7 mg/dL (ref 0.3–1.2)
Total Protein: 7 g/dL (ref 6.5–8.1)

## 2021-03-31 LAB — PROTEIN / CREATININE RATIO, URINE
Creatinine, Urine: 36.53 mg/dL
Total Protein, Urine: <6 mg/dL

## 2021-03-31 LAB — TSH: TSH: 1.655 u[IU]/mL (ref 0.308–3.960)

## 2021-03-31 MED ORDER — PROCHLORPERAZINE MALEATE 10 MG PO TABS
ORAL_TABLET | ORAL | Status: AC
  Filled 2021-03-31: qty 1

## 2021-03-31 MED ORDER — SODIUM CHLORIDE 0.9 % IV SOLN
Freq: Once | INTRAVENOUS | Status: AC
  Filled 2021-03-31: qty 250

## 2021-03-31 MED ORDER — SODIUM CHLORIDE 0.9% FLUSH
10.0000 mL | INTRAVENOUS | Status: DC | PRN
  Administered 2021-03-31: 10 mL
  Filled 2021-03-31: qty 10

## 2021-03-31 MED ORDER — SODIUM CHLORIDE 0.9 % IV SOLN
200.0000 mg | Freq: Once | INTRAVENOUS | Status: AC
  Administered 2021-03-31: 200 mg via INTRAVENOUS
  Filled 2021-03-31: qty 8

## 2021-03-31 MED ORDER — SODIUM CHLORIDE 0.9% FLUSH
10.0000 mL | Freq: Once | INTRAVENOUS | Status: AC
  Administered 2021-03-31: 10 mL
  Filled 2021-03-31: qty 10

## 2021-03-31 MED ORDER — SODIUM CHLORIDE 0.9 % IV SOLN
500.0000 mg/m2 | Freq: Once | INTRAVENOUS | Status: AC
  Administered 2021-03-31: 800 mg via INTRAVENOUS
  Filled 2021-03-31: qty 20

## 2021-03-31 MED ORDER — HEPARIN SOD (PORK) LOCK FLUSH 100 UNIT/ML IV SOLN
500.0000 [IU] | Freq: Once | INTRAVENOUS | Status: AC | PRN
  Administered 2021-03-31: 500 [IU]
  Filled 2021-03-31: qty 5

## 2021-03-31 MED ORDER — PROCHLORPERAZINE MALEATE 10 MG PO TABS
10.0000 mg | ORAL_TABLET | Freq: Once | ORAL | Status: AC
  Administered 2021-03-31: 10 mg via ORAL

## 2021-03-31 NOTE — Progress Notes (Signed)
OK to treat today w/ pulse 105 and elevated BP per Dr. Lorenso Courier.

## 2021-03-31 NOTE — Progress Notes (Signed)
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 CT of the head showed showed concerning for metastatic disease involving the left cerebellum and right occipital lobe.  2) 09/30/2019: MRI brain confirms mass within the left cerebellum measures 1.6 x 1.3 x 1.5 cm as well as at least 8 metastatic lesions within the supratentorial and infratentorial brain as outlined 3) 10/01/2019: PET CT scan revealed hypermetabolic infrahilar right lower lobe mass with hypermetabolic nodal metastases in the right hilum, mediastinum, right supraclavicular region, left axilla and lower left neck. 4) 10/08/2019: US guided biopsy of the lymph nodes confirms poorly differentiated non-small cell carcinoma. Immunohistochemistry confirms adenocarcinoma of lung primary. PD-L1 and NGS later revealed a MET Exon 14 mutation and TPS of 90%.  5) 10/12/2019: Establish care with Dr. Lorenso Courier  6) 11/04/2019: Day 1 of capmatinib 463m BID 7) 11/09/2019: presented to Rad/Onc with a DVT in LLE. Seen in symptom management clinic and started on apixaban.  8) 12/29/2019: CT C/A/P showed interval decrease in size of mass involving the superior segment of right lower lobe and reduction in mediastinal and left supraclavicular adenopathy though some small spinal lesions were noted in the interim between the two sets of scans 9) 01/25/2020: temporarily stopped capmatinib due to symptoms of nausea/fatigue. Restarted therapy on 01/30/2020 after brief chemo holiday.  10) 03/22/2020: CT C/A/P reveals a mixed picture, with new lung nodule and  increased lymphadenopathy, however response in bone lesions. D/c capmatinib therapy 11) 04/08/2020: start of Carbo/Pem/Pem for 2nd line treatment. Cycle 1 Day 1   12) 04/29/2020: Cycle 2 Day 1 Carbo/Pem/Pem  13) 05/20/2020: Cycle 3 Day 1 Carbo/Pem/Pem  14) 06/09/2020: Cycle 4 Day 1 Carbo/Pem/Pem  15) 06/29/2020: Cycle 5 Day 1 maintenance Pem/Pem  16) 07/20/2020: Cycle 6 Day 1 maintenance Pem/Pem  17) 08/12/2020: Cycle 7 Day 1 maintenance Pem/Pem  18) 09/01/2020: Cycle 8 Day 1 maintenance Pem/Pem  19) 09/21/2020: Cycle 9 Day 1 maintenance Pem/Pem  20) 10/13/2020: Cycle 10 Day 1 maintenance Pem/Pem  21) 11/03/2020: Cycle 11 Day 1 maintenance Pem/Pem  22) 11/25/2020:  Cycle 12 Day 1 maintenance Pem/Pem 23) 12/15/2020:  Cycle 13 Day 1 maintenance Pem/Pem 24) 01/05/2021:  Cycle 14 Day 1 maintenance Pem/Pem 25) 01/26/2021:  Cycle 15 Day 1 maintenance Pem/Pem plus Becacizumab.  02/17/2021: Cycle 16 Day 1 maintenance Pem/Pem plus Becacizumab.  03/10/2021: Cycle 17 Day 1 maintenance Pem/Pem plus Becacizumab.  03/31/2021: Cycle 18 Day 1 maintenance Pem/Pem plus Becacizumab.   Interval History:  Virginia BROWDER65y.o. female with medical history significant for metastatic adenocarcinoma of the lung presents for a follow up visit. The patient's last visit was on 03/10/2021. In the interim since the last visit, she has continued pem/pem with the addition of bevacizumab to her regimen. Today is Cycle 18 Day 1.   On exam today Virginia [name] is accompanied by her husband.  She reports that she is "always sleepy".  She notes that she was transferred over to hydrocortisone from dexamethasone and that the swelling in her legs appears to resolve more quickly.  She has had unfortunately an increase in dizziness and she has been more off  balance.  She is using a walker at home or holding onto objects around her in order to ambulate.  She notes that her nausea has become less consistent and more sporadic and that she is decreased her  Zyprexa back down to the original standard dose.  She is having some wheeziness and shortness of breath which is increasing.  She does have some occasional production of the sputum which has been clear phlegm.  She is eating well.  She denies any fevers, chills, shortness of breath, chest pain, cough, neuropathy or skin changes. She has no other complaints.  A full 10 point ROS is listed below.   MEDICAL HISTORY:  Past Medical History:  Diagnosis Date   Anemia    Anxiety    Concussion 09/28/2019   DVT (deep venous thrombosis) ([place]) 2021   L leg   Dyspnea    GERD (gastroesophageal reflux disease)    Hypercholesterolemia    per pt, she does not have elevated lipids   Hypertension    met lung ca dx'd 09/2019   mets to spine, hip and brain   PONV (postoperative nausea and vomiting)    Tobacco abuse     SURGICAL HISTORY: Past Surgical History:  Procedure Laterality Date   ABDOMINAL HYSTERECTOMY     partial/ left ovaries   APPLICATION OF CRANIAL NAVIGATION N/A 12/02/2020   Procedure: APPLICATION OF CRANIAL NAVIGATION;  Surgeon: Erline Levine, MD;  Location: [facility];  Service: Neurosurgery;  Laterality: N/A;   CHOLECYSTECTOMY     CRANIOTOMY N/A 12/02/2020   Procedure: Posterior fossa craniotomy for tumor resection with brainlab;  Surgeon: Erline Levine, MD;  Location: [facility];  Service: Neurosurgery;  Laterality: N/A;   DILATION AND CURETTAGE OF UTERUS     IR IMAGING GUIDED PORT INSERTION  10/23/2019   KYPHOPLASTY N/A 03/15/2020   Procedure: Thoracic Eight KYPHOPLASTY;  Surgeon: Erline Levine, MD;  Location: [facility];  Service: Neurosurgery;  Laterality: N/A;  prone    LIPOMA EXCISION  2018   removed under left breast and right thigh.   TUBAL LIGATION      ALLERGIES:  has No Known Allergies.  MEDICATIONS:  Current Outpatient Medications  Medication Sig Dispense Refill   hydrocortisone (CORTEF) 10 MG tablet Take 2 tablets (20 mg total) by mouth daily. 90 tablet 1   acetaminophen (TYLENOL)  500 MG tablet Take 1,000 mg by mouth every 8 (eight) hours as needed for moderate pain.     albuterol (VENTOLIN HFA) 108 (90 Base) MCG/ACT inhaler INHALE 2 PUFFS INTO THE LUNGS EVERY 6 HOURS AS NEEDED FOR WHEEZING OR SHORTNESS OF BREATH (Patient taking differently: Inhale 2 puffs into the lungs every 6 (six) hours as needed for shortness of breath or wheezing.) 6.7 g 11   ALPRAZolam (XANAX) 0.25 MG tablet Take 1 tablet (0.25 mg total) by mouth 2 (two) times daily as needed for anxiety 60 tablet 5   amLODipine (NORVASC) 10 MG tablet Take 1 tablet (10 mg total) by mouth daily. 30 tablet 3   Cyanocobalamin (VITAMIN B12 PO) Take 2 tablets by mouth daily. Gummies     folic acid (FOLVITE) 1 MG tablet Take 1 tablet by mouth daily. 90 tablet 1   furosemide (LASIX) 20 MG tablet Take 1 tablet (20 mg total) by mouth daily. 30 tablet 0   lidocaine-prilocaine (EMLA) cream Apply to port as needed. 30 g 0   Multiple Vitamins-Minerals (AIRBORNE PO) Take 1 tablet by mouth daily.      ofloxacin (  OCUFLOX) 0.3 % ophthalmic solution 2 drops each eye 4 times a day for 5 days (Patient taking differently: Place 2 drops into both eyes daily as needed (irritation).) 5 mL 1   OLANZapine (ZYPREXA) 2.5 MG tablet TAKE 1 TABLET BY MOUTH EVERY NIGHT AT BEDTIME 90 tablet 1   ondansetron (ZOFRAN) 4 MG tablet TAKE 1 TO 2 TABLETS BY MOUTH 2 TIMES DAILY AS NEEDED 60 tablet 0   oxyCODONE (OXY IR/ROXICODONE) 5 MG immediate release tablet TAKE 1 TO 2 TABLETS BY MOUTH EVERY 4 HOURS AS NEEDED FOR SEVERE PAIN 90 tablet 0   pantoprazole (PROTONIX) 40 MG tablet TAKE ONE TABLET BY MOUTH DAILY (Patient taking differently: Take 40 mg by mouth daily.) 90 tablet 3   pentoxifylline (TRENTAL) 400 MG CR tablet TAKE 1 TABLET BY MOUTH WITH FOOD FOR 1 WEEK THEN TAKE 1 TABLET BY MOUTH 2 TIMES DAILY THEREAFTER 60 tablet 2   polyethylene glycol (MIRALAX / GLYCOLAX) 17 g packet Take 17 g by mouth 2 (two) times daily.      promethazine (PHENERGAN) 12.5 MG  tablet TAKE 1 TO 2 TABLETS BY MOUTH EVERY 4 HOURS AS NEEDED FOR REFRACTORY NAUSEA OR VOMITING 30 tablet 0   Vitamin D3 (VITAMIN D) 25 MCG tablet Take 2,000 Units by mouth daily.     vitamin E 180 MG (400 UNITS) capsule Take 400 IU daily x 1 week, then 400 IU BID (Patient taking differently: Take 400 Units by mouth in the morning and at bedtime. Take 400 IU daily x 1 week, then 400 IU BID) 60 capsule 4   XTAMPZA ER 13.5 MG C12A Take 1 capsule by mouth in the morning and at bedtime. (every 12 hours) 60 capsule 0   No current facility-administered medications for this visit.   Facility-Administered Medications Ordered in Other Visits  Medication Dose Route Frequency Provider Last Rate Last Admin   sodium chloride flush (NS) 0.9 % injection 10 mL  10 mL Intracatheter PRN Orson Slick, MD   10 mL at 03/31/21 1420    REVIEW OF SYSTEMS:   Constitutional: ( - ) fevers, ( - )  chills , ( - ) night sweats Eyes: ( - ) blurriness of vision, ( - ) double vision, ( - ) watery eyes Ears, nose, mouth, throat, and face: ( - ) mucositis, ( - ) sore throat Respiratory: ( - ) cough, ( - ) dyspnea, ( - ) wheezes Cardiovascular: ( - ) palpitation, ( - ) chest discomfort, ( - ) lower extremity swelling Gastrointestinal:  ( - ) nausea, ( - ) heartburn, ( - ) change in bowel habits Skin: ( - ) abnormal skin rashes Lymphatics: ( - ) new lymphadenopathy, ( - ) easy bruising Neurological: ( - ) numbness, ( - ) tingling, ( - ) new weaknesses Behavioral/Psych: ( - ) mood change, ( - ) new changes  All other systems were reviewed with the patient and are negative.  PHYSICAL EXAMINATION: ECOG PERFORMANCE STATUS: 1 - Symptomatic but completely ambulatory  Vitals:   03/31/21 1131  BP: (!) 130/103  Pulse: (!) 105  Resp: 19  Temp: (!) 97.2 F (36.2 C)  SpO2: 98%   Filed Weights   03/31/21 1131  Weight: 152 lb 3.2 oz (69 kg)    GENERAL: well appearing middle aged Caucasian female in NAD  SKIN: skin color,  texture, turgor are normal, no rashes or significant lesions EYES: conjunctiva are pink and non-injected, sclera clear LUNGS: wheezing at  lung bases. clear to auscultation and percussion with normal breathing effort HEART: regular rate & rhythm and no murmurs and bilateral + trace pitting lower extremity edema Musculoskeletal: no cyanosis of digits and no clubbing PSYCH: alert & oriented x 3, fluent speech NEURO: no focal motor/sensory deficits    LABORATORY DATA:  I have reviewed the data as listed CBC Latest Ref Rng & Units 03/31/2021 03/10/2021 02/17/2021  WBC 4.0 - 10.5 K/uL 9.3 5.3 11.1(H)  Hemoglobin 12.0 - 15.0 g/dL 13.4 13.0 12.5  Hematocrit 36.0 - 46.0 % 38.3 36.8 36.6  Platelets 150 - 400 K/uL 296 310 295    CMP Latest Ref Rng & Units 03/31/2021 03/10/2021 02/17/2021  Glucose 70 - 99 mg/dL 130(H) 130(H) 102(H)  BUN 8 - 23 mg/dL 10 6(L) 14  Creatinine 0.44 - 1.00 mg/dL 0.69 0.68 0.65  Sodium 135 - 145 mmol/L 143 143 143  Potassium 3.5 - 5.1 mmol/L 3.3(L) 3.0(L) 3.4(L)  Chloride 98 - 111 mmol/L 101 101 107  CO2 22 - 32 mmol/L _0 Calcium 8.9 - 10.3 mg/dL 9.5 9.2 9.3  Total Protein 6.5 - 8.1 g/dL 7.0 6.7 6.5  Total Bilirubin 0.3 - 1.2 mg/dL 0.7 0.3 0.3  Alkaline Phos 38 - 126 U/L 54 64 55  AST 15 - 41 U/L 29 41 17  ALT 0 - 44 U/L 30 52(H) 28     RADIOGRAPHIC STUDIES: I personally have viewed the radiographic studies below: MR BRAIN W WO CONTRAST  Result Date: 03/20/2021 CLINICAL DATA:  65 year old female with metastatic lung cancer. Prior SRS to greater than 10 targets. 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 Restaging. EXAM: MRI HEAD WITHOUT AND WITH CONTRAST TECHNIQUE: Multiplanar, multiecho pulse sequences of the brain and surrounding structures were obtained without and with intravenous contrast. CONTRAST:  2m MULTIHANCE GADOBENATE DIMEGLUMINE 529 MG/ML IV SOLN  COMPARISON:  Brain MRI 12/22/2020 and earlier. FINDINGS: Brain: Sequelae of left suboccipital craniotomy and central left cerebellar resection with decreased size of the T2 hyperintense resection cavity since April (series 5, image 7). Regional T2 and FLAIR hyperintensity has also mildly regressed - including the involvement of the left cerebellar peduncle and lateral brainstem. Residual on series 8, image 22, which is partially restricted on diffusion now (series 3, image 51 and series 4, image 12). However, regional and marginal enhancement at the resection cavity has regressed since last month (T1 space black blood postcontrast imaging now series 11, images 44 and 32). Other heterogeneous and petechial enhancement in the cerebellar vermis and right hemisphere appears similarly regressed (series 11, image 35) also with decreased T2 and FLAIR heterogeneity. Subsequent decreased posterior fossa mass effect compared to April. Heterogeneous T2 signal in the right lateral medulla (series 8, image 17) is stable and without enhancement or abnormal diffusion. The small 4-5 mm nodule of enhancement in the right occipital lobe is regressed (series 11, image 65). Leptomeningeal enhancement in the right parietal lobe is regressed on series 11, image 97. Stable to the smaller 3 mm nodule of enhancement in the anterior left frontal lobe on series 11, image 118. And decreased small nodule of enhancement in the left posterior frontal lobe near the motor strip on series 11, image 122. Smooth generalized dural thickening appears stable since this time last year. Widespread Patchy and confluent bilateral white matter T2 and FLAIR heterogeneity in both hemispheres remains stable. No significant intracranial mass effect now. No restricted diffusion suggestive  of acute infarction. No midline shift,ventriculomegaly, extra-axial collection or acute intracranial hemorrhage. Cervicomedullary junction is within normal limits. Pituitary  enhancement and size appears stable since 2021. Vascular: Major intracranial vascular flow voids are stable. Skull and upper cervical spine: Visible cervical spine and spinal cord appears stable and negative. Visualized bone marrow signal is within normal limits. Sinuses/Orbits: Stable and negative orbits. Minor paranasal sinus mucosal thickening. Other: Mastoids remain well aerated. Visible internal auditory structures appear normal. IMPRESSION: 1. Regression of abnormal signal, enhancement, and mass effect in the posterior fossa since April. This is felt to be regression of multifocal cerebellar radiation necrosis given the negative pathology on surgical resection. But considerable residual heterogeneity in the left cerebellum and cerebellar peduncle. Possible non-enhancing involvement of the right lateral medulla. Recommend continued surveillance. 2. And all small supratentorial enhancing lesions are stable or regressed since April. Stable nonspecific dural thickening. No new or progressive disease. Electronically Signed   By: Genevie Ann M.D.   On: 03/20/2021 09:13   CT CHEST ABDOMEN PELVIS W CONTRAST  Result Date: 03/18/2021 CLINICAL DATA:  Restaging metastatic non-small cell lung cancer. EXAM: CT CHEST, ABDOMEN, AND PELVIS WITH CONTRAST TECHNIQUE: Multidetector CT imaging of the chest, abdomen and pelvis was performed following the standard protocol during bolus administration of intravenous contrast. CONTRAST:  131m OMNIPAQUE IOHEXOL 300 MG/ML  SOLN COMPARISON:  Numerous prior CT scans. The most recent is 12/13/2020 FINDINGS: CT CHEST FINDINGS Cardiovascular: The heart is upper limits of normal in size and stable. Mild left ventricular enlargement. Stable mild tortuosity and ectasia of the thoracic aorta but no focal aneurysm or dissection. Stable scattered atherosclerotic calcifications. No obvious coronary artery calcifications. Mediastinum/Nodes: No mediastinal or hilar mass or lymphadenopathy. Stable  matted soft tissue density in the subcarinal region, likely treated disease. The esophagus is grossly normal. Lungs/Pleura: Stable post treatment changes involving the medial aspect of the right lower lobe in the as ago esophageal recess. No findings suspicious for recurrent tumor. Stable underlying emphysematous changes and centrilobular nodularity suggesting respiratory bronchiolitis. There are numerous small scattered solid and ground-glass nodules in both lungs which appears stable. Index right apical ground-glass nodule on image number 38/6 measures 8 mm. 6 mm ground-glass nodule in the right upper lobe on image number 66/6. 8 mm left lower lobe ground-glass nodule on image number 78/6 New patchy area of ground-glass opacity in the left lower lobe anteriorly at the left lung base is likely atelectasis or inflammation. Musculoskeletal: No significant bony findings. CT ABDOMEN PELVIS FINDINGS Hepatobiliary: No hepatic lesions to suggest metastatic disease. The gallbladder is surgically absent. Mild stable associated common bile duct dilatation. Pancreas: No mass, inflammation or ductal dilatation. Spleen: Normal size. No focal lesions. Adrenals/Urinary Tract: Adrenal glands and kidneys are unremarkable. No renal lesions, renal calculi or hydronephrosis. The bladder is unremarkable. Stomach/Bowel: Stomach, duodenum, small and colon are. No acute inflammatory changes, mass lesions or obstructive findings. The terminal ileum is normal. The appendix is normal. Vascular/Lymphatic: Age advanced atherosclerotic calcifications involving the aorta and branch vessels but no aneurysm or dissection. No mesenteric or retroperitoneal mass or adenopathy. Reproductive: The uterus is surgically absent. Both ovaries are still present and appear normal. Other: No pelvic mass or adenopathy. No free pelvic fluid collections. No inguinal mass or adenopathy. No abdominal wall hernia or subcutaneous lesions. Musculoskeletal: Stable  scattered sclerotic bone lesions consistent with treated metastatic disease. Stable vertebral augmentation changes at T8. No new lytic destructive bone lesions. IMPRESSION: 1. Stable post treatment changes involving the medial  aspect of the right lower lobe in the azygoesophageal recess. No findings suspicious for recurrent tumor. 2. Stable matted soft tissue density in the subcarinal region, likely treated disease. 3. Stable numerous scattered solid and ground-glass nodules in both lungs. 4. New patchy area of ground-glass opacity in the left lower lobe anteriorly is likely atelectasis or inflammation. 5. No findings for abdominal/pelvic metastatic disease. 6. Stable scattered sclerotic bone lesions consistent with treated metastatic disease. No new with lytic destructive bone lesions. 7. Age advanced atherosclerotic calcifications involving the thoracic and abdominal aorta and branch vessels. 8. Status post cholecystectomy with mild stable common bile duct dilatation. 9. Emphysema and aortic atherosclerosis. Aortic Atherosclerosis (ICD10-I70.0) and Emphysema (ICD10-J43.9). Electronically Signed   By: Marijo Sanes M.D.   On: 03/18/2021 09:16     ASSESSMENT & PLAN NILE DORNING 65 y.o. female with medical history significant for metastatic adenocarcinoma of the lung presents for a follow up visit. 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   Previously we discussed Carboplatin/Pembrolizumab/Pemetrexed and the expected side effect from these medications.  We discussed the possible side effects of immunotherapy including colitis, hepatitis, dermatitis, and pneumonitis.  Additionally we discussed the side effects of carboplatin which include suppression of blood counts, nephrotoxicity, and nausea.  Additionally we discussed pemetrexed which can also have effects on counts and would require supplementation with vitamin C16 and folic acid.  She voiced her understanding of  these side effects and treatment plans moving forward.  I also noted that we would be able to drop the chemotherapy agents that she had intolerance to continue with pembrolizumab alone given her high TPS score.  The patient has a markedly elevated TPS score (90%)   Ms. Fergeson continues to tolerate the treatment. Her labs from today were reviewed without any intervention needed. Patient will proceed with treatment as planned and return to the clinic in 3 weeks prior to C17.   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 --patient will be followed every 3 weeks with labs and clinic visit. --port in place, patient completed chemotherapy education.  --plan for repeat CT C/A/P in Oct 2022 (q 3 months) --supportive therapy as listed below.  --RTC q 3 weeks for continued monitoring/chemotherapy treatments.    #Nausea/Vomiting, improving -- provided patient with Zofran 4-68m q8h PRN and compazine 122mq6H for breakthrough PRN.  She is currently using Phenergan for breakthrough. --continue olanzpaine 2.5 mg PO QHS to help with nausea. Marked improvement since the addition of this medication on 01/29/2020.  --continue to monitor   #Hypokalemia, stable --likely 2/2 to decreased PO intake, hypovolemia, and BP medications --Potassium level 3.3 today.   --continue to monitor  #Pain Control, stable --continue oxycodone 61m77m4H PRN. Can increase dose to 46m20mH if pain is severe. She is taking approximately 2-3 pills per day.  --continue Xtampza ER BID for long acting support.  --continue to take with Senokot to prevent opioid induced constipation. 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  --nausea as  above    #Brain Metastasis, progressing --appreciate the assistance of Dr. VaslMickeal Skinnertreating her brain metastasis and symptoms. --radiation therapy to the brain  completed on 11/16/2019.  --MRI brain on 04/29/2020 showed evidence of small brain bleed. Eliquis therapy was discontinued.  --MRI on 11/04/2020 showed concern for progression of intracranial disease.  --On 12/02/2020, patient underwent craniotomy, resection of progressive cerebellar mass by Dr. Vertell Limber. 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  --MRI on 12/23/2020 showed evidence of radiation necrosis. Dr. Mickeal Skinner evaluated the patient on 01/02/2021 with recommendations to add IV Bevacizumab 10 mg/kg q 3 weeks starting today (01/26/2021). --she has started hydrocortisone per his recommendations.  Bevacizumab has been discontinued and is not planned for today's regimen.  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    # Hypertension: --BP at today's visit was 130/103. Continue Norvasc to 10 mg daily.  --continue to monitor at home and in clinic.    No orders of the defined types were placed in this encounter.  All questions were answered. The patient knows to call the clinic with any problems, questions or concerns.  A total of more than 30 minutes were spent on this encounter and over half of that time was spent on counseling and coordination of care as outlined above.   Ledell Peoples, MD Department of Hematology/Oncology [facility] Phone: [phone] Pager: [phone] Email: [email or web address]   03/31/2021 5:22 PM   Literature Support:  Lorenza Chick, Rodrguez-Abreu D, Duffy Rhody, Felip E, De Angelis F, Domine M, [place], Hochmair MJ, [place], [place], Bischoff HG, [place], Grossi F, [place], Reck M, [place], [place], [place],  Rubio-Viqueira B, Novello S, Kurata T, Gray JE, Vida J, Wei Z, Yang J, Raftopoulos H, [place], [place] [employer]; [id number] Investigators. Pembrolizumab plus Chemotherapy in Metastatic Non-Small-Cell Lung Cancer. [place] Med. 2018 May 31;378(22):2078-2092.  --Median progression-free survival was 8.8 months (95% CI, 7.6 to 9.2) in the pembrolizumab-combination group and 4.9 months (95% CI, 4.7 to 5.5) in the placebo-combination group (hazard ratio for disease progression or death, 0.52; 95% CI, 0.43 to 0.64; P<0.001). Adverse events of grade 3 or higher occurred in 67.2% of the patients in the pembrolizumab-combination group and in 65.8% of those in the placebo-combination group.  Jodene Nam L. Efficacy of pemetrexed-based regimens in advanced non-small cell lung cancer patients with activating epidermal growth factor receptor mutations after tyrosine kinase inhibitor failure: a systematic review. Onco Targets Ther. 2018;11:2121-2129.   --The weighted median PFS, median OS, and ORR for patients treated with pem regimens were 5.09 months, 15.91 months, and 30.19%, respectively. Our systematic review results showed a favorable efficacy profile of pem regimens in NSCLC patients with EGFR mutation after EGFR-TKI failure.

## 2021-04-03 ENCOUNTER — Other Ambulatory Visit (HOSPITAL_COMMUNITY): Payer: Self-pay

## 2021-04-03 ENCOUNTER — Other Ambulatory Visit: Payer: Self-pay | Admitting: Hematology and Oncology

## 2021-04-03 MED ORDER — FUROSEMIDE 20 MG PO TABS
20.0000 mg | ORAL_TABLET | Freq: Every day | ORAL | 0 refills | Status: DC
  Filled 2021-04-03 – 2021-04-13 (×2): qty 30, 30d supply, fill #0

## 2021-04-04 ENCOUNTER — Other Ambulatory Visit (HOSPITAL_COMMUNITY): Payer: Self-pay

## 2021-04-07 ENCOUNTER — Encounter: Payer: Self-pay | Admitting: Internal Medicine

## 2021-04-10 ENCOUNTER — Other Ambulatory Visit: Payer: Self-pay | Admitting: Radiation Oncology

## 2021-04-10 ENCOUNTER — Other Ambulatory Visit (HOSPITAL_COMMUNITY): Payer: Self-pay

## 2021-04-10 DIAGNOSIS — C7931 Secondary malignant neoplasm of brain: Secondary | ICD-10-CM

## 2021-04-11 ENCOUNTER — Telehealth: Payer: Self-pay | Admitting: Radiation Therapy

## 2021-04-11 NOTE — Telephone Encounter (Signed)
Received a call from Mr. Geisel inquiring about whether or not Virginia [name] should continue taking the Trental Dr. Isidore Moos had prescribed to treat the radiation treatment effect/inflammation.   Dr. Isidore Moos is currently out of the office and will return on Monday 8/8. I have instructed  Virginia [name] to have Virginia [name] continue taking the medication as long as it is not causing any problems, and we will follow-up with Dr. Isidore Moos on Monday to see if she recommends Virginia [name] discontinue.    Mont Dutton R.T.(R)(T) Radiation Special Procedures Navigator

## 2021-04-12 ENCOUNTER — Other Ambulatory Visit: Payer: Self-pay | Admitting: Radiation Oncology

## 2021-04-12 ENCOUNTER — Other Ambulatory Visit (HOSPITAL_COMMUNITY): Payer: Self-pay

## 2021-04-12 DIAGNOSIS — C7931 Secondary malignant neoplasm of brain: Secondary | ICD-10-CM

## 2021-04-13 ENCOUNTER — Other Ambulatory Visit (HOSPITAL_COMMUNITY): Payer: Self-pay

## 2021-04-13 ENCOUNTER — Telehealth: Payer: Self-pay | Admitting: *Deleted

## 2021-04-14 ENCOUNTER — Telehealth: Payer: Self-pay | Admitting: *Deleted

## 2021-04-14 ENCOUNTER — Encounter: Payer: Self-pay | Admitting: Hematology and Oncology

## 2021-04-14 ENCOUNTER — Encounter: Payer: Self-pay | Admitting: Internal Medicine

## 2021-04-14 NOTE — Telephone Encounter (Signed)
Charted on another encounter.

## 2021-04-14 NOTE — Telephone Encounter (Signed)
Received call from pt's husband requesting refills on her oxycodone and Xtampza. He also stated that [name] saw the dentist today and wanted to make sure she can take Flexeril and Augmentin. PerDr. Lorenso Courier, he states iot is fine for her to take those. Advised that the flexeril may make her a bit sleepy.  [name] voiced understanding.

## 2021-04-17 ENCOUNTER — Other Ambulatory Visit (HOSPITAL_COMMUNITY): Payer: Self-pay

## 2021-04-17 ENCOUNTER — Other Ambulatory Visit: Payer: Self-pay | Admitting: Hematology and Oncology

## 2021-04-17 ENCOUNTER — Other Ambulatory Visit: Payer: Self-pay | Admitting: Radiation Oncology

## 2021-04-17 DIAGNOSIS — C7931 Secondary malignant neoplasm of brain: Secondary | ICD-10-CM

## 2021-04-17 MED ORDER — XTAMPZA ER 13.5 MG PO C12A
1.0000 | EXTENDED_RELEASE_CAPSULE | Freq: Two times a day (BID) | ORAL | 0 refills | Status: DC
  Filled 2021-04-17: qty 60, 30d supply, fill #0

## 2021-04-17 MED ORDER — OXYCODONE HCL 5 MG PO TABS
ORAL_TABLET | ORAL | 0 refills | Status: DC
  Filled 2021-04-17: qty 90, 8d supply, fill #0

## 2021-04-18 ENCOUNTER — Other Ambulatory Visit: Payer: Self-pay | Admitting: Radiation Oncology

## 2021-04-18 ENCOUNTER — Telehealth: Payer: Self-pay | Admitting: Radiation Therapy

## 2021-04-18 ENCOUNTER — Other Ambulatory Visit (HOSPITAL_COMMUNITY): Payer: Self-pay

## 2021-04-18 DIAGNOSIS — C7931 Secondary malignant neoplasm of brain: Secondary | ICD-10-CM

## 2021-04-18 NOTE — Telephone Encounter (Signed)
Returned Randy's call in response to the question if [name] should continue taking Trental and Vit E prescribed by Dr. Isidore Moos. Now that Dr. Mickeal Skinner has been following her MRIs and neurologic symptoms, Dr. Isidore Moos would like to defer to him on whether it's appropriate to remain on these meds. Dr. Mickeal Skinner has been looped into the original [facility] message for a reply. I left a voicemail on Randy's cell communicating this and that we will call back once we hear back from Dr. Mickeal Skinner.   Mont Dutton R.T.(R)(T) Radiation Special Procedures Navigator

## 2021-04-20 ENCOUNTER — Inpatient Hospital Stay: Payer: BC Managed Care – PPO | Attending: Hematology and Oncology

## 2021-04-20 ENCOUNTER — Other Ambulatory Visit: Payer: Self-pay | Admitting: Hematology and Oncology

## 2021-04-20 ENCOUNTER — Inpatient Hospital Stay (HOSPITAL_BASED_OUTPATIENT_CLINIC_OR_DEPARTMENT_OTHER): Payer: BC Managed Care – PPO | Admitting: Hematology and Oncology

## 2021-04-20 ENCOUNTER — Other Ambulatory Visit: Payer: Self-pay

## 2021-04-20 ENCOUNTER — Inpatient Hospital Stay: Payer: BC Managed Care – PPO

## 2021-04-20 VITALS — BP 116/93 | HR 108 | Temp 97.9°F | Resp 18 | Wt 155.3 lb

## 2021-04-20 VITALS — HR 100

## 2021-04-20 DIAGNOSIS — Z9049 Acquired absence of other specified parts of digestive tract: Secondary | ICD-10-CM | POA: Diagnosis not present

## 2021-04-20 DIAGNOSIS — W109XXA Fall (on) (from) unspecified stairs and steps, initial encounter: Secondary | ICD-10-CM | POA: Diagnosis not present

## 2021-04-20 DIAGNOSIS — R918 Other nonspecific abnormal finding of lung field: Secondary | ICD-10-CM

## 2021-04-20 DIAGNOSIS — Z95828 Presence of other vascular implants and grafts: Secondary | ICD-10-CM

## 2021-04-20 DIAGNOSIS — C3490 Malignant neoplasm of unspecified part of unspecified bronchus or lung: Secondary | ICD-10-CM | POA: Diagnosis not present

## 2021-04-20 DIAGNOSIS — Z7901 Long term (current) use of anticoagulants: Secondary | ICD-10-CM | POA: Insufficient documentation

## 2021-04-20 DIAGNOSIS — C3431 Malignant neoplasm of lower lobe, right bronchus or lung: Secondary | ICD-10-CM | POA: Insufficient documentation

## 2021-04-20 DIAGNOSIS — I10 Essential (primary) hypertension: Secondary | ICD-10-CM | POA: Insufficient documentation

## 2021-04-20 DIAGNOSIS — E876 Hypokalemia: Secondary | ICD-10-CM | POA: Diagnosis not present

## 2021-04-20 DIAGNOSIS — C7951 Secondary malignant neoplasm of bone: Secondary | ICD-10-CM | POA: Diagnosis not present

## 2021-04-20 DIAGNOSIS — R42 Dizziness and giddiness: Secondary | ICD-10-CM | POA: Insufficient documentation

## 2021-04-20 DIAGNOSIS — C7931 Secondary malignant neoplasm of brain: Secondary | ICD-10-CM

## 2021-04-20 DIAGNOSIS — Z86718 Personal history of other venous thrombosis and embolism: Secondary | ICD-10-CM | POA: Diagnosis not present

## 2021-04-20 DIAGNOSIS — Z79899 Other long term (current) drug therapy: Secondary | ICD-10-CM | POA: Diagnosis not present

## 2021-04-20 DIAGNOSIS — R112 Nausea with vomiting, unspecified: Secondary | ICD-10-CM | POA: Diagnosis not present

## 2021-04-20 DIAGNOSIS — Z923 Personal history of irradiation: Secondary | ICD-10-CM | POA: Diagnosis not present

## 2021-04-20 DIAGNOSIS — Z5111 Encounter for antineoplastic chemotherapy: Secondary | ICD-10-CM | POA: Diagnosis not present

## 2021-04-20 DIAGNOSIS — Z5112 Encounter for antineoplastic immunotherapy: Secondary | ICD-10-CM | POA: Diagnosis not present

## 2021-04-20 LAB — CMP (CANCER CENTER ONLY)
ALT: 34 U/L (ref 0–44)
AST: 25 U/L (ref 15–41)
Albumin: 3.2 g/dL — ABNORMAL LOW (ref 3.5–5.0)
Alkaline Phosphatase: 60 U/L (ref 38–126)
Anion gap: 13 (ref 5–15)
BUN: 10 mg/dL (ref 8–23)
CO2: 29 mmol/L (ref 22–32)
Calcium: 9.3 mg/dL (ref 8.9–10.3)
Chloride: 102 mmol/L (ref 98–111)
Creatinine: 0.77 mg/dL (ref 0.44–1.00)
GFR, Estimated: >60 mL/min (ref >60)
Glucose, Bld: 131 mg/dL — ABNORMAL HIGH (ref 70–99)
Potassium: 3.1 mmol/L — ABNORMAL LOW (ref 3.5–5.1)
Sodium: 144 mmol/L (ref 135–145)
Total Bilirubin: 0.3 mg/dL (ref 0.3–1.2)
Total Protein: 6.9 g/dL (ref 6.5–8.1)

## 2021-04-20 LAB — CBC WITH DIFFERENTIAL (CANCER CENTER ONLY)
Abs Immature Granulocytes: 0.05 10*3/uL (ref 0.00–0.07)
Basophils Absolute: 0 10*3/uL (ref 0.0–0.1)
Basophils Relative: 0 %
Eosinophils Absolute: 0.1 10*3/uL (ref 0.0–0.5)
Eosinophils Relative: 1 %
HCT: 35.5 % — ABNORMAL LOW (ref 36.0–46.0)
Hemoglobin: 12.7 g/dL (ref 12.0–15.0)
Immature Granulocytes: 1 %
Lymphocytes Relative: 12 %
Lymphs Abs: 1.2 10*3/uL (ref 0.7–4.0)
MCH: 33.9 pg (ref 26.0–34.0)
MCHC: 35.8 g/dL (ref 30.0–36.0)
MCV: 94.7 fL (ref 80.0–100.0)
Monocytes Absolute: 0.9 10*3/uL (ref 0.1–1.0)
Monocytes Relative: 9 %
Neutro Abs: 7.9 10*3/uL — ABNORMAL HIGH (ref 1.7–7.7)
Neutrophils Relative %: 77 %
Platelet Count: 312 10*3/uL (ref 150–400)
RBC: 3.75 MIL/uL — ABNORMAL LOW (ref 3.87–5.11)
RDW: 13.2 % (ref 11.5–15.5)
WBC Count: 10.2 10*3/uL (ref 4.0–10.5)
nRBC: 0 % (ref 0.0–0.2)

## 2021-04-20 LAB — LACTATE DEHYDROGENASE: LDH: 234 U/L — ABNORMAL HIGH (ref 98–192)

## 2021-04-20 LAB — PROTEIN / CREATININE RATIO, URINE
Creatinine, Urine: 22.97 mg/dL
Total Protein, Urine: <6 mg/dL

## 2021-04-20 MED ORDER — ZOLEDRONIC ACID 4 MG/100ML IV SOLN
4.0000 mg | Freq: Once | INTRAVENOUS | Status: AC
  Administered 2021-04-20: 4 mg via INTRAVENOUS

## 2021-04-20 MED ORDER — SODIUM CHLORIDE 0.9 % IV SOLN
200.0000 mg | Freq: Once | INTRAVENOUS | Status: AC
  Administered 2021-04-20: 200 mg via INTRAVENOUS
  Filled 2021-04-20: qty 8

## 2021-04-20 MED ORDER — ZOLEDRONIC ACID 4 MG/100ML IV SOLN
INTRAVENOUS | Status: AC
  Filled 2021-04-20: qty 100

## 2021-04-20 MED ORDER — HEPARIN SOD (PORK) LOCK FLUSH 100 UNIT/ML IV SOLN
500.0000 [IU] | Freq: Once | INTRAVENOUS | Status: AC | PRN
  Administered 2021-04-20: 500 [IU]
  Filled 2021-04-20: qty 5

## 2021-04-20 MED ORDER — SODIUM CHLORIDE 0.9 % IV SOLN
Freq: Once | INTRAVENOUS | Status: DC
  Filled 2021-04-20: qty 250

## 2021-04-20 MED ORDER — PROCHLORPERAZINE MALEATE 10 MG PO TABS
10.0000 mg | ORAL_TABLET | Freq: Once | ORAL | Status: AC
Start: 2021-04-20 — End: 2021-04-20
  Administered 2021-04-20: 10 mg via ORAL

## 2021-04-20 MED ORDER — SODIUM CHLORIDE 0.9% FLUSH
10.0000 mL | INTRAVENOUS | Status: DC | PRN
  Administered 2021-04-20: 10 mL
  Filled 2021-04-20: qty 10

## 2021-04-20 MED ORDER — PROCHLORPERAZINE MALEATE 10 MG PO TABS
ORAL_TABLET | ORAL | Status: AC
  Filled 2021-04-20: qty 1

## 2021-04-20 MED ORDER — SODIUM CHLORIDE 0.9% FLUSH
10.0000 mL | Freq: Once | INTRAVENOUS | Status: AC
  Administered 2021-04-20: 10 mL
  Filled 2021-04-20: qty 10

## 2021-04-20 MED ORDER — PEMETREXED DISODIUM CHEMO INJECTION 500 MG
500.0000 mg/m2 | Freq: Once | INTRAVENOUS | Status: AC
  Administered 2021-04-20: 800 mg via INTRAVENOUS
  Filled 2021-04-20: qty 20

## 2021-04-20 NOTE — Progress Notes (Signed)
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 CT of the head showed showed concerning for metastatic disease involving the left cerebellum and right occipital lobe.  2) 09/30/2019: MRI brain confirms mass within the left cerebellum measures 1.6 x 1.3 x 1.5 cm as well as at least 8 metastatic lesions within the supratentorial and infratentorial brain as outlined 3) 10/01/2019: PET CT scan revealed hypermetabolic infrahilar right lower lobe mass with hypermetabolic nodal metastases in the right hilum, mediastinum, right supraclavicular region, left axilla and lower left neck. 4) 10/08/2019: US guided biopsy of the lymph nodes confirms poorly differentiated non-small cell carcinoma. Immunohistochemistry confirms adenocarcinoma of lung primary. PD-L1 and NGS later revealed a MET Exon 14 mutation and TPS of 90%.  5) 10/12/2019: Establish care with Dr. Lorenso [name]  6) 11/04/2019: Day 1 of capmatinib $RemoveBefor'400mg'yZGBaGVRZWZh$  BID 7) 11/09/2019: presented to Rad/Onc with a DVT in LLE. Seen in symptom management clinic and started on apixaban.  8) 12/29/2019: CT C/A/P showed interval decrease in size of mass involving the superior segment of right lower lobe and reduction in mediastinal and left supraclavicular adenopathy though some small spinal lesions were noted in the interim between the two sets of scans 9) 01/25/2020: temporarily stopped capmatinib due to symptoms of nausea/fatigue. Restarted therapy on 01/30/2020 after brief chemo holiday.  10) 03/22/2020: CT C/A/P reveals a mixed picture, with new lung nodule and  increased lymphadenopathy, however response in bone lesions. D/c capmatinib therapy 11) 04/08/2020: start of Carbo/Pem/Pem for 2nd line treatment. Cycle 1 Day 1   12) 04/29/2020: Cycle 2 Day 1 Carbo/Pem/Pem  13) 05/20/2020: Cycle 3 Day 1 Carbo/Pem/Pem  14) 06/09/2020: Cycle 4 Day 1 Carbo/Pem/Pem  15) 06/29/2020: Cycle 5 Day 1 maintenance Pem/Pem  16) 07/20/2020: Cycle 6 Day 1 maintenance Pem/Pem  17) 08/12/2020: Cycle 7 Day 1 maintenance Pem/Pem  18) 09/01/2020: Cycle 8 Day 1 maintenance Pem/Pem  19) 09/21/2020: Cycle 9 Day 1 maintenance Pem/Pem  20) 10/13/2020: Cycle 10 Day 1 maintenance Pem/Pem  21) 11/03/2020: Cycle 11 Day 1 maintenance Pem/Pem  22) 11/25/2020:  Cycle 12 Day 1 maintenance Pem/Pem 23) 12/15/2020:  Cycle 13 Day 1 maintenance Pem/Pem 24) 01/05/2021:  Cycle 14 Day 1 maintenance Pem/Pem 25) 01/26/2021:  Cycle 15 Day 1 maintenance Pem/Pem plus Becacizumab.  02/17/2021: Cycle 16 Day 1 maintenance Pem/Pem plus Becacizumab.  03/10/2021: Cycle 17 Day 1 maintenance Pem/Pem plus Becacizumab.  03/31/2021: Cycle 18 Day 1 maintenance Pem/Pem plus Becacizumab. 04/20/2021:  Cycle 19 Day 1 maintenance Pem/Pem plus Becacizumab.  Interval History:  Virginia [name] 65 y.o. female with medical history significant for metastatic adenocarcinoma of the lung presents for a follow up visit. The patient's last visit was on 03/31/2021. In the interim since the last visit, she has continued pem/pem with the addition of bevacizumab to her regimen. Today is Cycle 19 Day 1.   On exam today [name] is accompanied by her husband.  She reports she has not been feeling particularly well.  She continues to have episodes of dizziness.  She also has been having swelling in her lower extremities.  Fortunately her appetite has been good though she  is often not hungry in the morning.  She notes that she continues to have deep sleep and does have frequent nightmares which was an issue prior to diagnosis of cancer.  Her blood  pressure is under better control today and her husband notes that she typically runs in the 120s over 70s at home.  She denies any fevers, chills, shortness of breath, chest pain, cough, neuropathy or skin changes. She has no other complaints.  A full 10 point ROS is listed below.   MEDICAL HISTORY:  Past Medical History:  Diagnosis Date   Anemia    Anxiety    Concussion 09/28/2019   DVT (deep venous thrombosis) ([place]) 2021   L leg   Dyspnea    GERD (gastroesophageal reflux disease)    Hypercholesterolemia    per pt, she does not have elevated lipids   Hypertension    met lung ca dx'd 09/2019   mets to spine, hip and brain   PONV (postoperative nausea and vomiting)    Tobacco abuse     SURGICAL HISTORY: Past Surgical History:  Procedure Laterality Date   ABDOMINAL HYSTERECTOMY     partial/ left ovaries   APPLICATION OF CRANIAL NAVIGATION N/A 12/02/2020   Procedure: APPLICATION OF CRANIAL NAVIGATION;  Surgeon: Erline Levine, [name];  Location: [place];  Service: Neurosurgery;  Laterality: N/A;   CHOLECYSTECTOMY     CRANIOTOMY N/A 12/02/2020   Procedure: Posterior fossa craniotomy for tumor resection with brainlab;  Surgeon: Erline Levine, [name];  Location: [place];  Service: Neurosurgery;  Laterality: N/A;   DILATION AND CURETTAGE OF UTERUS     IR IMAGING GUIDED PORT INSERTION  10/23/2019   KYPHOPLASTY N/A 03/15/2020   Procedure: Thoracic Eight KYPHOPLASTY;  Surgeon: Erline Levine, [name];  Location: [place];  Service: Neurosurgery;  Laterality: N/A;  prone    LIPOMA EXCISION  2018   removed under left breast and right thigh.   TUBAL LIGATION      ALLERGIES:  has No Known Allergies.  MEDICATIONS:  Current Outpatient Medications  Medication Sig Dispense Refill   prochlorperazine (COMPAZINE) 10 MG tablet Take 10 mg by mouth every 6 (six) hours as needed for nausea or vomiting.     acetaminophen (TYLENOL) 500 MG tablet Take 1,000 mg by mouth every 8 (eight) hours as needed for moderate pain.      albuterol (VENTOLIN HFA) 108 (90 Base) MCG/ACT inhaler INHALE 2 PUFFS INTO THE LUNGS EVERY 6 HOURS AS NEEDED FOR WHEEZING OR SHORTNESS OF BREATH (Patient taking differently: Inhale 2 puffs into the lungs every 6 (six) hours as needed for shortness of breath or wheezing.) 6.7 g 11   ALPRAZolam (XANAX) 0.25 MG tablet Take 1 tablet (0.25 mg total) by mouth 2 (two) times daily as needed for anxiety 60 tablet 5   amLODipine (NORVASC) 10 MG tablet Take 1 tablet (10 mg total) by mouth daily. 30 tablet 3   amoxicillin-clavulanate (AUGMENTIN) 500-125 MG tablet Take 1 tablet by mouth every 8 (eight) hours.     Cyanocobalamin (VITAMIN B12 PO) Take 2 tablets by mouth daily. Gummies     cyclobenzaprine (FLEXERIL) 5 MG tablet Take 5 mg by mouth at bedtime.     folic acid (FOLVITE) 1 MG tablet Take 1 tablet by mouth daily. 90 tablet 1   furosemide (LASIX) 20 MG tablet Take 1 tablet (20 mg total) by mouth daily. 30 tablet 0   hydrocortisone (CORTEF) 10 MG tablet Take 2 tablets (20 mg total) by mouth daily.  90 tablet 1   lidocaine-prilocaine (EMLA) cream Apply to port as needed. 30 g 0   Multiple Vitamins-Minerals (AIRBORNE PO) Take 1 tablet by mouth daily.      ofloxacin (OCUFLOX) 0.3 % ophthalmic solution 2 drops each eye 4 times a day for 5 days (Patient taking differently: Place 2 drops into both eyes daily as needed (irritation).) 5 mL 1   OLANZapine (ZYPREXA) 2.5 MG tablet TAKE 1 TABLET BY MOUTH EVERY NIGHT AT BEDTIME 90 tablet 1   ondansetron (ZOFRAN) 4 MG tablet TAKE 1 TO 2 TABLETS BY MOUTH 2 TIMES DAILY AS NEEDED 60 tablet 0   oxyCODONE (OXY IR/ROXICODONE) 5 MG immediate release tablet TAKE 1 TO 2 TABLETS BY MOUTH EVERY 4 HOURS AS NEEDED FOR SEVERE PAIN 90 tablet 0   pantoprazole (PROTONIX) 40 MG tablet TAKE ONE TABLET BY MOUTH DAILY (Patient taking differently: Take 40 mg by mouth daily.) 90 tablet 3   pentoxifylline (TRENTAL) 400 MG CR tablet TAKE 1 TABLET BY MOUTH WITH FOOD FOR 1 WEEK THEN TAKE 1 TABLET  BY MOUTH 2 TIMES DAILY THEREAFTER 60 tablet 2   polyethylene glycol (MIRALAX / GLYCOLAX) 17 g packet Take 17 g by mouth 2 (two) times daily.      promethazine (PHENERGAN) 12.5 MG tablet TAKE 1 TO 2 TABLETS BY MOUTH EVERY 4 HOURS AS NEEDED FOR REFRACTORY NAUSEA OR VOMITING 30 tablet 0   Vitamin D3 (VITAMIN D) 25 MCG tablet Take 2,000 Units by mouth daily.     vitamin E 180 MG (400 UNITS) capsule Take 400 IU daily x 1 week, then 400 IU BID (Patient taking differently: Take 400 Units by mouth in the morning and at bedtime. Take 400 IU daily x 1 week, then 400 IU BID) 60 capsule 4   XTAMPZA ER 13.5 MG C12A Take 1 capsule by mouth in the morning and at bedtime. (every 12 hours) 60 capsule 0   No current facility-administered medications for this visit.   Facility-Administered Medications Ordered in Other Visits  Medication Dose Route Frequency Provider Last Rate Last Admin   0.9 %  sodium chloride infusion   Intravenous Once Orson Slick, [name]       prochlorperazine (COMPAZINE) 10 MG tablet            sodium chloride flush (NS) 0.9 % injection 10 mL  10 mL Intracatheter PRN Orson Slick, [name]   10 mL at 04/20/21 1405   Zoledronic Acid (ZOMETA) 4 MG/100ML IVPB             REVIEW OF SYSTEMS:   Constitutional: ( - ) fevers, ( - )  chills , ( - ) night sweats Eyes: ( - ) blurriness of vision, ( - ) double vision, ( - ) watery eyes Ears, nose, mouth, throat, and face: ( - ) mucositis, ( - ) sore throat Respiratory: ( - ) cough, ( - ) dyspnea, ( - ) wheezes Cardiovascular: ( - ) palpitation, ( - ) chest discomfort, ( - ) lower extremity swelling Gastrointestinal:  ( - ) nausea, ( - ) heartburn, ( - ) change in bowel habits Skin: ( - ) abnormal skin rashes Lymphatics: ( - ) new lymphadenopathy, ( - ) easy bruising Neurological: ( - ) numbness, ( - ) tingling, ( - ) new weaknesses Behavioral/Psych: ( - ) mood change, ( - ) new changes  All other systems were reviewed with the patient and are  negative.  PHYSICAL EXAMINATION:  ECOG PERFORMANCE STATUS: 1 - Symptomatic but completely ambulatory  Vitals:   04/20/21 1113  BP: (!) 116/93  Pulse: (!) 108  Resp: 18  Temp: 97.9 F (36.6 C)  SpO2: 96%   Filed Weights   04/20/21 1113  Weight: 155 lb 4.8 oz (70.4 kg)    GENERAL: well appearing middle aged Caucasian female in NAD  SKIN: skin color, texture, turgor are normal, no rashes or significant lesions EYES: conjunctiva are pink and non-injected, sclera clear LUNGS: wheezing at lung bases. clear to auscultation and percussion with normal breathing effort HEART: regular rate & rhythm and no murmurs and bilateral + trace pitting lower extremity edema Musculoskeletal: no cyanosis of digits and no clubbing PSYCH: alert & oriented x 3, fluent speech NEURO: no focal motor/sensory deficits    LABORATORY DATA:  I have reviewed the data as listed CBC Latest Ref Rng & Units 04/20/2021 03/31/2021 03/10/2021  WBC 4.0 - 10.5 K/uL 10.2 9.3 5.3  Hemoglobin 12.0 - 15.0 g/dL 12.7 13.4 13.0  Hematocrit 36.0 - 46.0 % 35.5(L) 38.3 36.8  Platelets 150 - 400 K/uL 312 296 310    CMP Latest Ref Rng & Units 04/20/2021 03/31/2021 03/10/2021  Glucose 70 - 99 mg/dL 131(H) 130(H) 130(H)  BUN 8 - 23 mg/dL 10 10 6(L)  Creatinine 0.44 - 1.00 mg/dL 0.77 0.69 0.68  Sodium 135 - 145 mmol/L 144 143 143  Potassium 3.5 - 5.1 mmol/L 3.1(L) 3.3(L) 3.0(L)  Chloride 98 - 111 mmol/L 102 101 101  CO2 22 - 32 mmol/L $RemoveB'29 30 31  'GDWHNPLK$ Calcium 8.9 - 10.3 mg/dL 9.3 9.5 9.2  Total Protein 6.5 - 8.1 g/dL 6.9 7.0 6.7  Total Bilirubin 0.3 - 1.2 mg/dL 0.3 0.7 0.3  Alkaline Phos 38 - 126 U/L 60 54 64  AST 15 - 41 U/L 25 29 41  ALT 0 - 44 U/L 34 30 52(H)     RADIOGRAPHIC STUDIES: I personally have viewed the radiographic studies below: No results found.   ASSESSMENT & PLAN Virginia [name] 65 y.o. female with medical history significant for metastatic adenocarcinoma of the lung presents for a follow up visit. [facility] testing has shown she has a MET Exon 14 mutation and TPS of 90%.   Previously we discussed Carboplatin/Pembrolizumab/Pemetrexed and the expected side effect from these medications.  We discussed the possible side effects of immunotherapy including colitis, hepatitis, dermatitis, and pneumonitis.  Additionally we discussed the side effects of carboplatin which include suppression of blood counts, nephrotoxicity, and nausea.  Additionally we discussed pemetrexed which can also have effects on counts and would require supplementation with vitamin U88 and folic acid.  She voiced her understanding of these side effects and treatment plans moving forward.  I also noted that we would be able to drop the chemotherapy agents that she had intolerance to continue with pembrolizumab alone given her high TPS score.  The patient has a markedly elevated TPS score (90%)   Ms. Leeb continues to tolerate the treatment. Her labs from today were reviewed without any intervention needed. Patient will proceed with treatment as planned and return to the clinic in 3 weeks prior to her next cycle.   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 --patient will be followed every 3 weeks with labs and  clinic visit. --port in place, patient completed chemotherapy education.  --plan for repeat CT C/A/P in Oct 2022 (q 3 months) --supportive therapy as listed below.  --RTC q 3 weeks for continued monitoring/chemotherapy treatments.    #Nausea/Vomiting, improving -- provided patient with Zofran 4-8mg  q8h PRN and compazine 10mg  q6H for breakthrough PRN.  She is currently using Phenergan for breakthrough. --continue olanzpaine 2.5 mg PO QHS to help with nausea. Marked improvement since the addition of this medication on  01/29/2020.  --continue to monitor   #Hypokalemia, stable --likely 2/2 to decreased PO intake, hypovolemia, and BP medications --Potassium level 3.1 today.   --continue to monitor  #Pain Control, stable --continue oxycodone 5mg  q4H PRN. Can increase dose to 10mg  q6H if pain is severe. She is taking approximately 2-3 pills per day.  --continue Xtampza ER BID for long acting support.  --continue to take with Senokot to prevent opioid induced constipation. 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  --nausea as above    #Brain Metastasis, progressing --appreciate the assistance of Dr. 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 --radiation therapy to the brain completed on 11/16/2019.  --MRI brain on 04/29/2020 showed evidence of small brain bleed. Eliquis therapy was discontinued.  --MRI on 11/04/2020 showed concern for progression of intracranial disease.  --On 12/02/2020, patient underwent craniotomy, resection of progressive cerebellar mass by Dr. Vertell Limber. 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  --MRI on 12/23/2020 showed evidence of radiation necrosis. Dr. Mickeal Skinner evaluated the patient on 01/02/2021 with recommendations to add IV Bevacizumab 10 mg/kg q 3 weeks starting today (01/26/2021). --she has started hydrocortisone per his recommendations.  Bevacizumab has been discontinued and is not planned for today's regimen.  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    # Hypertension: --BP at today's visit was 116/93. Continue Norvasc to 10 mg daily.  --continue to monitor at home and in clinic.    No orders of the defined types were placed in this encounter.  All questions were answered. The patient knows to call the clinic with any problems,  questions or concerns.  A total of more than 30 minutes were spent on this encounter and over half of that time was spent on counseling and coordination of care as outlined above.   Ledell Peoples, [name] Department of Hematology/Oncology [place] [facility] Phone: [phone] Pager: [phone] Email: [email or web address]   04/20/2021 2:15 PM   Literature Support:  Lorenza Chick, Rodrguez-Abreu D, Duffy Rhody, Felip E, De Angelis F, Domine M, Kingsley, Hochmair MJ, [place], [place], Bischoff HG, [place], Grossi F, [place], Reck M, [place], [place], Hendrix, Rubio-Viqueira B, Novello S, Kurata T, Gray JE, Vida J, Wei Z, Yang J, Raftopoulos H, [place], Bagley [employer]; [id number] Investigators. Pembrolizumab plus Chemotherapy in Metastatic Non-Small-Cell Lung Cancer. [place] Med. 2018 May 31;378(22):2078-2092.  --Median progression-free survival was 8.8 months (95% CI, 7.6 to 9.2) in the pembrolizumab-combination group and 4.9 months (95% CI, 4.7 to 5.5) in the placebo-combination group (hazard ratio for disease progression or death, 0.52; 95% CI, 0.43 to 0.64; P<0.001). Adverse events of grade 3 or higher occurred in 67.2% of the patients in the pembrolizumab-combination group and in 65.8% of those in the placebo-combination  group.  Jodene Nam L. Efficacy of pemetrexed-based regimens in advanced non-small cell lung cancer patients with activating epidermal growth factor receptor mutations after tyrosine kinase inhibitor failure: a systematic review. Onco Targets Ther. 2018;11:2121-2129.   --The weighted median PFS, median OS, and ORR for patients treated with pem regimens were 5.09 months, 15.91 months, and 30.19%, respectively. Our systematic review results showed a favorable efficacy profile of pem regimens in NSCLC patients with EGFR mutation after EGFR-TKI failure.

## 2021-04-20 NOTE — Patient Instructions (Signed)
[facility] [place] [facility]  Discharge Instructions: Thank you for choosing [facility] to provide your oncology and hematology care.   If you have a lab appointment with the Cancer Center, please go directly to the Cancer Center and check in at the registration area.   Wear comfortable clothing and clothing appropriate for easy access to any Portacath or PICC line.   We strive to give you quality time with your provider. You may need to reschedule your appointment if you arrive late (15 or more minutes).  Arriving late affects you and other patients whose appointments are after yours.  Also, if you miss three or more appointments without notifying the office, you may be dismissed from the clinic at the provider's discretion.      For prescription refill requests, have your pharmacy contact our office and allow 72 hours for refills to be completed.    Today you received the following chemotherapy and/or immunotherapy agents keytruda, alimta      To help prevent nausea and vomiting after your treatment, we encourage you to take your nausea medication as directed.  BELOW ARE SYMPTOMS THAT SHOULD BE REPORTED IMMEDIATELY: *FEVER GREATER THAN 100.4 F (38 C) OR HIGHER *CHILLS OR SWEATING *NAUSEA AND VOMITING THAT IS NOT CONTROLLED WITH YOUR NAUSEA MEDICATION *UNUSUAL SHORTNESS OF BREATH *UNUSUAL BRUISING OR BLEEDING *URINARY PROBLEMS (pain or burning when urinating, or frequent urination) *BOWEL PROBLEMS (unusual diarrhea, constipation, pain near the anus) TENDERNESS IN MOUTH AND THROAT WITH OR WITHOUT PRESENCE OF ULCERS (sore throat, sores in mouth, or a toothache) UNUSUAL RASH, SWELLING OR PAIN  UNUSUAL VAGINAL DISCHARGE OR ITCHING   Items with * indicate a potential emergency and should be followed up as soon as possible or go to the Emergency Department if any problems should occur.  Please show the CHEMOTHERAPY ALERT CARD or IMMUNOTHERAPY ALERT CARD at  check-in to the Emergency Department and triage nurse.  Should you have questions after your visit or need to cancel or reschedule your appointment, please contact [facility]  Dept: [phone]  and follow the prompts.  Office hours are 8:00 a.m. to 4:30 p.m. Monday - Friday. Please note that voicemails left after 4:00 p.m. may not be returned until the following business day.  We are closed weekends and major holidays. You have access to a nurse at all times for urgent questions. Please call the main number to the clinic Dept: [phone] and follow the prompts.   For any non-urgent questions, you may also contact your provider using MyChart. We now offer e-Visits for anyone 18 and older to request care online for non-urgent symptoms. For details visit [email or web address].   Also download the MyChart app! Go to the app store, search "MyChart", open the app, select [facility], and log in with your MyChart username and password.  Due to Covid, a mask is required upon entering the hospital/clinic. If you do not have a mask, one will be given to you upon arrival. For doctor visits, patients may have 1 support person aged 18 or older with them. For treatment visits, patients cannot have anyone with them due to current Covid guidelines and our immunocompromised population.   

## 2021-04-21 ENCOUNTER — Ambulatory Visit: Payer: BC Managed Care – PPO

## 2021-04-21 ENCOUNTER — Other Ambulatory Visit: Payer: BC Managed Care – PPO

## 2021-04-21 ENCOUNTER — Ambulatory Visit: Payer: BC Managed Care – PPO | Admitting: Hematology and Oncology

## 2021-05-01 ENCOUNTER — Other Ambulatory Visit: Payer: Self-pay

## 2021-05-02 ENCOUNTER — Other Ambulatory Visit (HOSPITAL_COMMUNITY): Payer: Self-pay

## 2021-05-02 MED ORDER — PROMETHAZINE HCL 12.5 MG PO TABS
ORAL_TABLET | ORAL | 0 refills | Status: DC
  Filled 2021-05-02: qty 20, 3d supply, fill #0

## 2021-05-03 ENCOUNTER — Other Ambulatory Visit (HOSPITAL_COMMUNITY): Payer: Self-pay

## 2021-05-04 ENCOUNTER — Other Ambulatory Visit (HOSPITAL_COMMUNITY): Payer: Self-pay

## 2021-05-11 ENCOUNTER — Telehealth: Payer: Self-pay | Admitting: *Deleted

## 2021-05-11 ENCOUNTER — Other Ambulatory Visit (HOSPITAL_COMMUNITY): Payer: Self-pay

## 2021-05-11 ENCOUNTER — Other Ambulatory Visit: Payer: Self-pay | Admitting: *Deleted

## 2021-05-11 ENCOUNTER — Other Ambulatory Visit: Payer: Self-pay | Admitting: Hematology and Oncology

## 2021-05-11 DIAGNOSIS — C7931 Secondary malignant neoplasm of brain: Secondary | ICD-10-CM

## 2021-05-11 DIAGNOSIS — C7951 Secondary malignant neoplasm of bone: Secondary | ICD-10-CM

## 2021-05-11 MED ORDER — FUROSEMIDE 20 MG PO TABS
20.0000 mg | ORAL_TABLET | Freq: Every day | ORAL | 0 refills | Status: DC
  Filled 2021-05-11: qty 30, 30d supply, fill #0

## 2021-05-11 NOTE — Telephone Encounter (Signed)
Recieved call from pt's husband.  He is requesting a walker with a seat, 4 wheels and brakes, plus a light weight wheelchair.  Order for DME placed

## 2021-05-11 NOTE — Telephone Encounter (Signed)
    durable medical equipment  Light weight manual wheelchair  Pt is 5'2"  155lbs

## 2021-05-12 ENCOUNTER — Ambulatory Visit: Payer: BC Managed Care – PPO | Admitting: Hematology and Oncology

## 2021-05-12 ENCOUNTER — Inpatient Hospital Stay: Payer: BC Managed Care – PPO

## 2021-05-12 ENCOUNTER — Other Ambulatory Visit: Payer: BC Managed Care – PPO

## 2021-05-12 ENCOUNTER — Inpatient Hospital Stay (HOSPITAL_BASED_OUTPATIENT_CLINIC_OR_DEPARTMENT_OTHER): Payer: BC Managed Care – PPO | Admitting: Hematology and Oncology

## 2021-05-12 ENCOUNTER — Inpatient Hospital Stay: Payer: BC Managed Care – PPO | Attending: Hematology and Oncology

## 2021-05-12 ENCOUNTER — Ambulatory Visit: Payer: BC Managed Care – PPO

## 2021-05-12 ENCOUNTER — Other Ambulatory Visit: Payer: Self-pay

## 2021-05-12 VITALS — BP 133/96 | HR 101 | Temp 97.0°F | Resp 18 | Wt 157.3 lb

## 2021-05-12 VITALS — HR 91

## 2021-05-12 DIAGNOSIS — E876 Hypokalemia: Secondary | ICD-10-CM | POA: Diagnosis not present

## 2021-05-12 DIAGNOSIS — Z95828 Presence of other vascular implants and grafts: Secondary | ICD-10-CM

## 2021-05-12 DIAGNOSIS — C7951 Secondary malignant neoplasm of bone: Secondary | ICD-10-CM

## 2021-05-12 DIAGNOSIS — C3490 Malignant neoplasm of unspecified part of unspecified bronchus or lung: Secondary | ICD-10-CM

## 2021-05-12 DIAGNOSIS — Z5111 Encounter for antineoplastic chemotherapy: Secondary | ICD-10-CM | POA: Diagnosis not present

## 2021-05-12 DIAGNOSIS — Z86718 Personal history of other venous thrombosis and embolism: Secondary | ICD-10-CM | POA: Insufficient documentation

## 2021-05-12 DIAGNOSIS — R112 Nausea with vomiting, unspecified: Secondary | ICD-10-CM | POA: Diagnosis not present

## 2021-05-12 DIAGNOSIS — I10 Essential (primary) hypertension: Secondary | ICD-10-CM | POA: Diagnosis not present

## 2021-05-12 DIAGNOSIS — C7931 Secondary malignant neoplasm of brain: Secondary | ICD-10-CM | POA: Diagnosis not present

## 2021-05-12 DIAGNOSIS — C778 Secondary and unspecified malignant neoplasm of lymph nodes of multiple regions: Secondary | ICD-10-CM | POA: Insufficient documentation

## 2021-05-12 DIAGNOSIS — Z79899 Other long term (current) drug therapy: Secondary | ICD-10-CM | POA: Diagnosis not present

## 2021-05-12 DIAGNOSIS — Z5112 Encounter for antineoplastic immunotherapy: Secondary | ICD-10-CM | POA: Diagnosis not present

## 2021-05-12 DIAGNOSIS — R52 Pain, unspecified: Secondary | ICD-10-CM | POA: Insufficient documentation

## 2021-05-12 DIAGNOSIS — C3431 Malignant neoplasm of lower lobe, right bronchus or lung: Secondary | ICD-10-CM | POA: Insufficient documentation

## 2021-05-12 DIAGNOSIS — R918 Other nonspecific abnormal finding of lung field: Secondary | ICD-10-CM

## 2021-05-12 DIAGNOSIS — C3491 Malignant neoplasm of unspecified part of right bronchus or lung: Secondary | ICD-10-CM | POA: Diagnosis not present

## 2021-05-12 LAB — CBC WITH DIFFERENTIAL (CANCER CENTER ONLY)
Abs Immature Granulocytes: 0.09 10*3/uL — ABNORMAL HIGH (ref 0.00–0.07)
Basophils Absolute: 0.1 10*3/uL (ref 0.0–0.1)
Basophils Relative: 1 %
Eosinophils Absolute: 0.1 10*3/uL (ref 0.0–0.5)
Eosinophils Relative: 1 %
HCT: 38.8 % (ref 36.0–46.0)
Hemoglobin: 13.4 g/dL (ref 12.0–15.0)
Immature Granulocytes: 1 %
Lymphocytes Relative: 21 %
Lymphs Abs: 2 10*3/uL (ref 0.7–4.0)
MCH: 33.7 pg (ref 26.0–34.0)
MCHC: 34.5 g/dL (ref 30.0–36.0)
MCV: 97.5 fL (ref 80.0–100.0)
Monocytes Absolute: 1.2 10*3/uL — ABNORMAL HIGH (ref 0.1–1.0)
Monocytes Relative: 13 %
Neutro Abs: 6.4 10*3/uL (ref 1.7–7.7)
Neutrophils Relative %: 63 %
Platelet Count: 277 10*3/uL (ref 150–400)
RBC: 3.98 MIL/uL (ref 3.87–5.11)
RDW: 14.4 % (ref 11.5–15.5)
WBC Count: 9.9 10*3/uL (ref 4.0–10.5)
nRBC: 0 % (ref 0.0–0.2)

## 2021-05-12 LAB — CMP (CANCER CENTER ONLY)
ALT: 20 U/L (ref 0–44)
AST: 17 U/L (ref 15–41)
Albumin: 3.3 g/dL — ABNORMAL LOW (ref 3.5–5.0)
Alkaline Phosphatase: 62 U/L (ref 38–126)
Anion gap: 10 (ref 5–15)
BUN: 13 mg/dL (ref 8–23)
CO2: 27 mmol/L (ref 22–32)
Calcium: 9.1 mg/dL (ref 8.9–10.3)
Chloride: 104 mmol/L (ref 98–111)
Creatinine: 0.72 mg/dL (ref 0.44–1.00)
GFR, Estimated: >60 mL/min
Glucose, Bld: 112 mg/dL — ABNORMAL HIGH (ref 70–99)
Potassium: 3.5 mmol/L (ref 3.5–5.1)
Sodium: 141 mmol/L (ref 135–145)
Total Bilirubin: 0.4 mg/dL (ref 0.3–1.2)
Total Protein: 7 g/dL (ref 6.5–8.1)

## 2021-05-12 LAB — PROTEIN / CREATININE RATIO, URINE
Creatinine, Urine: 13.36 mg/dL
Total Protein, Urine: <6 mg/dL

## 2021-05-12 LAB — LACTATE DEHYDROGENASE: LDH: 221 U/L — ABNORMAL HIGH (ref 98–192)

## 2021-05-12 MED ORDER — PROCHLORPERAZINE MALEATE 10 MG PO TABS
10.0000 mg | ORAL_TABLET | Freq: Once | ORAL | Status: AC
  Administered 2021-05-12: 10 mg via ORAL
  Filled 2021-05-12: qty 1

## 2021-05-12 MED ORDER — SODIUM CHLORIDE 0.9% FLUSH
10.0000 mL | Freq: Once | INTRAVENOUS | Status: AC | PRN
  Administered 2021-05-12: 10 mL

## 2021-05-12 MED ORDER — SODIUM CHLORIDE 0.9 % IV SOLN
200.0000 mg | Freq: Once | INTRAVENOUS | Status: AC
  Administered 2021-05-12: 200 mg via INTRAVENOUS
  Filled 2021-05-12: qty 8

## 2021-05-12 MED ORDER — SODIUM CHLORIDE 0.9 % IV SOLN
500.0000 mg/m2 | Freq: Once | INTRAVENOUS | Status: AC
  Administered 2021-05-12: 800 mg via INTRAVENOUS
  Filled 2021-05-12: qty 20

## 2021-05-12 MED ORDER — SODIUM CHLORIDE 0.9% FLUSH
10.0000 mL | INTRAVENOUS | Status: DC | PRN
  Administered 2021-05-12: 10 mL

## 2021-05-12 MED ORDER — HEPARIN SOD (PORK) LOCK FLUSH 100 UNIT/ML IV SOLN
500.0000 [IU] | Freq: Once | INTRAVENOUS | Status: AC | PRN
  Administered 2021-05-12: 500 [IU]

## 2021-05-12 MED ORDER — SODIUM CHLORIDE 0.9 % IV SOLN: Freq: Once | INTRAVENOUS | Status: AC

## 2021-05-12 NOTE — Progress Notes (Signed)
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 CT of the head showed showed concerning for metastatic disease involving the left cerebellum and right occipital lobe.  2) 09/30/2019: MRI brain confirms mass within the left cerebellum measures 1.6 x 1.3 x 1.5 cm as well as at least 8 metastatic lesions within the supratentorial and infratentorial brain as outlined 3) 10/01/2019: PET CT scan revealed hypermetabolic infrahilar right lower lobe mass with hypermetabolic nodal metastases in the right hilum, mediastinum, right supraclavicular region, left axilla and lower left neck. 4) 10/08/2019: US guided biopsy of the lymph nodes confirms poorly differentiated non-small cell carcinoma. Immunohistochemistry confirms adenocarcinoma of lung primary. PD-L1 and NGS later revealed a MET Exon 14 mutation and TPS of 90%.  5) 10/12/2019: Establish care with Dr. Lorenso Courier  6) 11/04/2019: Day 1 of capmatinib 417m BID 7) 11/09/2019: presented to Rad/Onc with a DVT in LLE. Seen in symptom management clinic and started on apixaban.  8) 12/29/2019: CT C/A/P showed interval decrease in size of mass involving the superior segment of right lower lobe and reduction in mediastinal and left supraclavicular adenopathy though some small spinal lesions were noted in the interim between the two sets of scans 9) 01/25/2020: temporarily stopped capmatinib due to symptoms of nausea/fatigue. Restarted therapy on 01/30/2020 after brief chemo holiday.  10) 03/22/2020: CT C/A/P reveals a mixed picture, with new lung nodule and  increased lymphadenopathy, however response in bone lesions. D/c capmatinib therapy 11) 04/08/2020: start of Carbo/Pem/Pem for 2nd line treatment. Cycle 1 Day 1   12) 04/29/2020: Cycle 2 Day 1 Carbo/Pem/Pem  13) 05/20/2020: Cycle 3 Day 1 Carbo/Pem/Pem  14) 06/09/2020: Cycle 4 Day 1 Carbo/Pem/Pem  15) 06/29/2020: Cycle 5 Day 1 maintenance Pem/Pem  16) 07/20/2020: Cycle 6 Day 1 maintenance Pem/Pem  17) 08/12/2020: Cycle 7 Day 1 maintenance Pem/Pem  18) 09/01/2020: Cycle 8 Day 1 maintenance Pem/Pem  19) 09/21/2020: Cycle 9 Day 1 maintenance Pem/Pem  20) 10/13/2020: Cycle 10 Day 1 maintenance Pem/Pem  21) 11/03/2020: Cycle 11 Day 1 maintenance Pem/Pem  22) 11/25/2020:  Cycle 12 Day 1 maintenance Pem/Pem 23) 12/15/2020:  Cycle 13 Day 1 maintenance Pem/Pem 24) 01/05/2021:  Cycle 14 Day 1 maintenance Pem/Pem 25) 01/26/2021:  Cycle 15 Day 1 maintenance Pem/Pem plus Becacizumab.  02/17/2021: Cycle 16 Day 1 maintenance Pem/Pem plus Becacizumab.  03/10/2021: Cycle 17 Day 1 maintenance Pem/Pem plus Becacizumab.  03/31/2021: Cycle 18 Day 1 maintenance Pem/Pem plus Becacizumab. 04/20/2021:  Cycle 19 Day 1 maintenance Pem/Pem plus Becacizumab. 05/12/2021: Cycle 20 Day 1 maintenance Pem/Pem plus Becacizumab.  Interval History:  Virginia TAPP89y.o. female with medical history significant for metastatic adenocarcinoma of the lung presents for a follow up visit. The patient's last visit was on 04/20/2021. In the interim since the last visit, she has continued pem/pem with the addition of bevacizumab to her regimen. Today is Cycle 20 Day 1.   On exam today Virginia [name] is accompanied by her husband.  She reports she is doing quite well.  She is not having any issues with nausea since she restarted the olanzapine therapy.  She notes that she is  also still having some dizziness but it is moderately improved.  She reports that her appetite is good and her weight is increasing.  She also notes that the swelling has been improving.   She is not having any issues with bleeding or bruising.  She denies any fevers, chills, shortness of breath, chest pain, cough, neuropathy or skin changes. She has no other complaints.  A full 10 point ROS is listed below.   MEDICAL HISTORY:  Past Medical History:  Diagnosis Date   Anemia    Anxiety    Concussion 09/28/2019   DVT (deep venous thrombosis) ([place]) 2021   L leg   Dyspnea    GERD (gastroesophageal reflux disease)    Hypercholesterolemia    per pt, she does not have elevated lipids   Hypertension    met lung ca dx'd 09/2019   mets to spine, hip and brain   PONV (postoperative nausea and vomiting)    Tobacco abuse     SURGICAL HISTORY: Past Surgical History:  Procedure Laterality Date   ABDOMINAL HYSTERECTOMY     partial/ left ovaries   APPLICATION OF CRANIAL NAVIGATION N/A 12/02/2020   Procedure: APPLICATION OF CRANIAL NAVIGATION;  Surgeon: Erline Levine, MD;  Location: [place];  Service: Neurosurgery;  Laterality: N/A;   CHOLECYSTECTOMY     CRANIOTOMY N/A 12/02/2020   Procedure: Posterior fossa craniotomy for tumor resection with brainlab;  Surgeon: Erline Levine, MD;  Location: [place];  Service: Neurosurgery;  Laterality: N/A;   DILATION AND CURETTAGE OF UTERUS     IR IMAGING GUIDED PORT INSERTION  10/23/2019   KYPHOPLASTY N/A 03/15/2020   Procedure: Thoracic Eight KYPHOPLASTY;  Surgeon: Erline Levine, MD;  Location: [place];  Service: Neurosurgery;  Laterality: N/A;  prone    LIPOMA EXCISION  2018   removed under left breast and right thigh.   TUBAL LIGATION      ALLERGIES:  has No Known Allergies.  MEDICATIONS:  Current Outpatient Medications  Medication Sig Dispense Refill   acetaminophen (TYLENOL) 500 MG tablet Take 1,000 mg by mouth every 8 (eight) hours as needed for moderate pain.     albuterol (VENTOLIN HFA) 108 (90 Base) MCG/ACT inhaler INHALE 2 PUFFS INTO THE LUNGS EVERY 6 HOURS AS NEEDED FOR WHEEZING OR SHORTNESS OF BREATH (Patient taking differently:  Inhale 2 puffs into the lungs every 6 (six) hours as needed for shortness of breath or wheezing.) 6.7 g 11   ALPRAZolam (XANAX) 0.25 MG tablet Take 1 tablet (0.25 mg total) by mouth 2 (two) times daily as needed for anxiety 60 tablet 5   amLODipine (NORVASC) 10 MG tablet Take 1 tablet (10 mg total) by mouth daily. 30 tablet 3   Cyanocobalamin (VITAMIN B12 PO) Take 2 tablets by mouth daily. Gummies     cyclobenzaprine (FLEXERIL) 5 MG tablet Take 5 mg by mouth at bedtime.     folic acid (FOLVITE) 1 MG tablet Take 1 tablet by mouth daily. 90 tablet 1   furosemide (LASIX) 20 MG tablet Take 1 tablet (20 mg total) by mouth daily. 30 tablet 0   hydrocortisone (CORTEF) 10 MG tablet Take 2 tablets (20 mg total) by mouth daily. 90 tablet 1   lidocaine-prilocaine (EMLA) cream Apply to port as needed. 30 g 0   Multiple Vitamins-Minerals (AIRBORNE PO) Take 1 tablet by mouth daily.      ofloxacin (OCUFLOX) 0.3 % ophthalmic solution 2 drops each eye 4 times a day for 5 days (Patient taking differently:  Place 2 drops into both eyes daily as needed (irritation).) 5 mL 1   OLANZapine (ZYPREXA) 2.5 MG tablet TAKE 1 TABLET BY MOUTH EVERY NIGHT AT BEDTIME 90 tablet 1   ondansetron (ZOFRAN) 4 MG tablet TAKE 1 TO 2 TABLETS BY MOUTH 2 TIMES DAILY AS NEEDED 60 tablet 0   oxyCODONE (OXY IR/ROXICODONE) 5 MG immediate release tablet TAKE 1 TO 2 TABLETS BY MOUTH EVERY 4 HOURS AS NEEDED FOR SEVERE PAIN 90 tablet 0   pantoprazole (PROTONIX) 40 MG tablet TAKE ONE TABLET BY MOUTH DAILY (Patient taking differently: Take 40 mg by mouth daily.) 90 tablet 3   pentoxifylline (TRENTAL) 400 MG CR tablet TAKE 1 TABLET BY MOUTH WITH FOOD FOR 1 WEEK THEN TAKE 1 TABLET BY MOUTH 2 TIMES DAILY THEREAFTER 60 tablet 2   polyethylene glycol (MIRALAX / GLYCOLAX) 17 g packet Take 17 g by mouth 2 (two) times daily.      prochlorperazine (COMPAZINE) 10 MG tablet Take 10 mg by mouth every 6 (six) hours as needed for nausea or vomiting.      promethazine (PHENERGAN) 12.5 MG tablet TAKE 1 TO 2 TABLETS BY MOUTH EVERY 4 HOURS AS NEEDED FOR REFRACTORY NAUSEA OR VOMITING 30 tablet 0   promethazine (PHENERGAN) 12.5 MG tablet Take 1 to 2 tablets by mouth every 6 hours as needed for nausea 20 tablet 0   Vitamin D3 (VITAMIN D) 25 MCG tablet Take 2,000 Units by mouth daily.     vitamin E 180 MG (400 UNITS) capsule Take 400 IU daily x 1 week, then 400 IU BID (Patient taking differently: Take 400 Units by mouth in the morning and at bedtime. Take 400 IU daily x 1 week, then 400 IU BID) 60 capsule 4   XTAMPZA ER 13.5 MG C12A Take 1 capsule by mouth in the morning and at bedtime. (every 12 hours) 60 capsule 0   No current facility-administered medications for this visit.   Facility-Administered Medications Ordered in Other Visits  Medication Dose Route Frequency Provider Last Rate Last Admin   sodium chloride flush (NS) 0.9 % injection 10 mL  10 mL Intracatheter PRN Orson Slick, MD   10 mL at 05/12/21 1400    REVIEW OF SYSTEMS:   Constitutional: ( - ) fevers, ( - )  chills , ( - ) night sweats Eyes: ( - ) blurriness of vision, ( - ) double vision, ( - ) watery eyes Ears, nose, mouth, throat, and face: ( - ) mucositis, ( - ) sore throat Respiratory: ( - ) cough, ( - ) dyspnea, ( - ) wheezes Cardiovascular: ( - ) palpitation, ( - ) chest discomfort, ( - ) lower extremity swelling Gastrointestinal:  ( - ) nausea, ( - ) heartburn, ( - ) change in bowel habits Skin: ( - ) abnormal skin rashes Lymphatics: ( - ) new lymphadenopathy, ( - ) easy bruising Neurological: ( - ) numbness, ( - ) tingling, ( - ) new weaknesses Behavioral/Psych: ( - ) mood change, ( - ) new changes  All other systems were reviewed with the patient and are negative.  PHYSICAL EXAMINATION: ECOG PERFORMANCE STATUS: 1 - Symptomatic but completely ambulatory  Vitals:   05/12/21 1053  BP: (!) 133/96  Pulse: (!) 101  Resp: 18  Temp: (!) 97 F (36.1 C)  SpO2: 99%    Filed Weights   05/12/21 1053  Weight: 157 lb 5 oz (71.4 kg)    GENERAL: well appearing middle  aged Caucasian female in NAD  SKIN: skin color, texture, turgor are normal, no rashes or significant lesions EYES: conjunctiva are pink and non-injected, sclera clear LUNGS: wheezing at lung bases. clear to auscultation and percussion with normal breathing effort HEART: regular rate & rhythm and no murmurs and bilateral + trace pitting lower extremity edema Musculoskeletal: no cyanosis of digits and no clubbing PSYCH: alert & oriented x 3, fluent speech NEURO: no focal motor/sensory deficits    LABORATORY DATA:  I have reviewed the data as listed CBC Latest Ref Rng & Units 05/12/2021 04/20/2021 03/31/2021  WBC 4.0 - 10.5 K/uL 9.9 10.2 9.3  Hemoglobin 12.0 - 15.0 g/dL 13.4 12.7 13.4  Hematocrit 36.0 - 46.0 % 38.8 35.5(L) 38.3  Platelets 150 - 400 K/uL 277 312 296    CMP Latest Ref Rng & Units 05/12/2021 04/20/2021 03/31/2021  Glucose 70 - 99 mg/dL 112(H) 131(H) 130(H)  BUN 8 - 23 mg/dL _0 Creatinine 0.44 - 1.00 mg/dL 0.72 0.77 0.69  Sodium 135 - 145 mmol/L 141 144 143  Potassium 3.5 - 5.1 mmol/L 3.5 3.1(L) 3.3(L)  Chloride 98 - 111 mmol/L 104 102 101  CO2 22 - 32 mmol/L _1 Calcium 8.9 - 10.3 mg/dL 9.1 9.3 9.5  Total Protein 6.5 - 8.1 g/dL 7.0 6.9 7.0  Total Bilirubin 0.3 - 1.2 mg/dL 0.4 0.3 0.7  Alkaline Phos 38 - 126 U/L 62 60 54  AST 15 - 41 U/L _2 ALT 0 - 44 U/L 20 34 30     RADIOGRAPHIC STUDIES: I personally have viewed the radiographic studies below: No results found.   ASSESSMENT & PLAN CYD HOSTLER 65 y.o. female with medical history significant for metastatic adenocarcinoma of the lung presents for a follow up visit. 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   Previously we discussed Carboplatin/Pembrolizumab/Pemetrexed and the expected side effect from these medications.  We discussed the possible side  effects of immunotherapy including colitis, hepatitis, dermatitis, and pneumonitis.  Additionally we discussed the side effects of carboplatin which include suppression of blood counts, nephrotoxicity, and nausea.  Additionally we discussed pemetrexed which can also have effects on counts and would require supplementation with vitamin Z61 and folic acid.  She voiced her understanding of these side effects and treatment plans moving forward.  I also noted that we would be able to drop the chemotherapy agents that she had intolerance to continue with pembrolizumab alone given her high TPS score.  The patient has a markedly elevated TPS score (90%)   Virginia [name] continues to tolerate the treatment. Her labs from today were reviewed without any intervention needed. Patient will proceed with treatment as planned and return to the clinic in 3 weeks prior to her next cycle.   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 --patient will be followed every 3 weeks with labs and clinic visit. --port in place, patient completed chemotherapy education.  --plan for repeat CT C/A/P in Oct 2022 (q 3 months) --supportive therapy as listed below.  --RTC q 3 weeks for continued monitoring/chemotherapy treatments.    #Nausea/Vomiting, improving -- provided patient with Zofran 4-62m q8h PRN and compazine 142mq6H for breakthrough PRN.  She is  currently using Phenergan for breakthrough. --continue olanzpaine 2.5 mg PO QHS to help with nausea. Marked improvement since the addition of this medication on 01/29/2020.  --continue to monitor   #Hypokalemia, stable --likely 2/2 to decreased PO intake, hypovolemia, and BP medications --Potassium level 3.5 today.   --continue to monitor  #Pain Control, stable --continue  oxycodone 92m q4H PRN. Can increase dose to 196mq6H if pain is severe. She is taking approximately 2-3 pills per day.  --continue Xtampza ER BID for long acting support.  --continue to take with Senokot to prevent opioid induced constipation. 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  --nausea as above    #Brain Metastasis, progressing --appreciate the assistance of Dr. VaMickeal Skinnern treating her brain metastasis and symptoms. --radiation therapy to the brain completed on 11/16/2019.  --MRI brain on 04/29/2020 showed evidence of small brain bleed. Eliquis therapy was discontinued.  --MRI on 11/04/2020 showed concern for progression of intracranial disease.  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  --MRI on 12/23/2020 showed evidence of radiation necrosis. Dr. VaMickeal Skinnervaluated the patient on 01/02/2021 with recommendations to add IV Bevacizumab 10 mg/kg q 3 weeks starting today (01/26/2021). --she has started hydrocortisone per his recommendations.  Bevacizumab has been discontinued and is not planned for today's regimen.  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    # Hypertension: --BP at today's visit was 116/93. Continue Norvasc to 10 mg daily.  --continue to monitor at home and in clinic.    No orders of the defined types were placed in this encounter.  All questions were answered. The patient knows to call the clinic with any problems, questions or concerns.  A total of more than 30 minutes were spent on this encounter and over half of that time was spent on counseling and coordination of care as outlined above.   JoLedell PeoplesMD Department of  Hematology/Oncology CoBucyrust WeBayshore Medical Centerhone: [phone]: [phone]: [email or web address]   05/12/2021 3:55 PM   Literature Support:  GaLorenza ChickRodrguez-Abreu D, GaDuffy RhodyFelip E, De Angelis F, Domine M, ClHoma HillsHochmair MJ, PoWest UnityChJenaBischoff HG, PeSaegertownGrossi F, JeWarfieldReck M, HuDaggettGaPrimroseBoArden HillsRubio-Viqueira B, Novello S, Kurata T, Gray JE, Vida J, Wei Z, Yang J, Raftopoulos H, PiClaytonGaSt. FrancisCWeyerhaeuser [id number] Investigators. Pembrolizumab plus Chemotherapy in Metastatic Non-Small-Cell Lung Cancer. N Alta Corninged. 2018 May 31;378(22):2078-2092.  --Median progression-free survival was 8.8 months (95% CI, 7.6 to 9.2) in the pembrolizumab-combination group and 4.9 months (95% CI, 4.7 to 5.5) in the placebo-combination group (hazard ratio for disease progression or death, 0.52; 95% CI, 0.43 to 0.64; P<0.001). Adverse events of grade 3 or higher occurred in 67.2% of the patients in the pembrolizumab-combination group and in 65.8% of those in the placebo-combination group.  HaJodene Nam. Efficacy of pemetrexed-based regimens in advanced non-small cell lung cancer patients with activating epidermal growth factor receptor mutations after tyrosine kinase inhibitor failure: a systematic review. Onco Targets Ther. 2018;11:2121-2129.   --The weighted median PFS, median OS, and ORR for patients treated with pem regimens were 5.09  months, 15.91 months, and 30.19%, respectively. Our systematic review results showed a favorable efficacy profile of pem regimens in NSCLC patients with EGFR mutation after EGFR-TKI failure.

## 2021-05-20 DIAGNOSIS — I6789 Other cerebrovascular disease: Secondary | ICD-10-CM | POA: Diagnosis not present

## 2021-05-22 ENCOUNTER — Other Ambulatory Visit: Payer: Self-pay | Admitting: Internal Medicine

## 2021-05-22 ENCOUNTER — Other Ambulatory Visit (HOSPITAL_COMMUNITY): Payer: Self-pay

## 2021-05-22 ENCOUNTER — Other Ambulatory Visit: Payer: Self-pay | Admitting: Hematology and Oncology

## 2021-05-22 ENCOUNTER — Other Ambulatory Visit: Payer: Self-pay | Admitting: Radiation Therapy

## 2021-05-22 MED ORDER — DEXAMETHASONE 1 MG PO TABS
2.0000 mg | ORAL_TABLET | Freq: Every day | ORAL | 1 refills | Status: DC
  Filled 2021-05-22: qty 60, 30d supply, fill #0
  Filled 2021-06-15: qty 60, 30d supply, fill #1

## 2021-05-23 ENCOUNTER — Encounter: Payer: Self-pay | Admitting: Internal Medicine

## 2021-05-23 ENCOUNTER — Other Ambulatory Visit (HOSPITAL_COMMUNITY): Payer: Self-pay

## 2021-05-23 ENCOUNTER — Encounter: Payer: Self-pay | Admitting: Hematology and Oncology

## 2021-05-23 MED ORDER — XTAMPZA ER 13.5 MG PO C12A
1.0000 | EXTENDED_RELEASE_CAPSULE | Freq: Two times a day (BID) | ORAL | 0 refills | Status: DC
  Filled 2021-05-23: qty 60, 30d supply, fill #0

## 2021-05-25 ENCOUNTER — Ambulatory Visit
Admission: RE | Admit: 2021-05-25 | Discharge: 2021-05-25 | Disposition: A | Discharge status: Home / Self Care | Payer: BC Managed Care – PPO | Source: Ambulatory Visit | Attending: Internal Medicine | Admitting: Internal Medicine

## 2021-05-25 ENCOUNTER — Other Ambulatory Visit: Payer: Self-pay

## 2021-05-25 DIAGNOSIS — C7931 Secondary malignant neoplasm of brain: Secondary | ICD-10-CM

## 2021-05-25 DIAGNOSIS — C719 Malignant neoplasm of brain, unspecified: Secondary | ICD-10-CM | POA: Diagnosis not present

## 2021-05-25 DIAGNOSIS — J3489 Other specified disorders of nose and nasal sinuses: Secondary | ICD-10-CM | POA: Diagnosis not present

## 2021-05-25 MED ORDER — GADOBENATE DIMEGLUMINE 529 MG/ML IV SOLN
13.0000 mL | Freq: Once | INTRAVENOUS | Status: AC | PRN
  Administered 2021-05-25: 13 mL via INTRAVENOUS

## 2021-05-29 ENCOUNTER — Inpatient Hospital Stay: Payer: BC Managed Care – PPO

## 2021-05-30 ENCOUNTER — Inpatient Hospital Stay (HOSPITAL_BASED_OUTPATIENT_CLINIC_OR_DEPARTMENT_OTHER): Payer: BC Managed Care – PPO | Admitting: Internal Medicine

## 2021-05-30 ENCOUNTER — Ambulatory Visit: Payer: BC Managed Care – PPO | Admitting: Internal Medicine

## 2021-05-30 ENCOUNTER — Other Ambulatory Visit: Payer: Self-pay

## 2021-05-30 VITALS — BP 126/89 | HR 97 | Temp 97.7°F | Resp 17 | Ht 62.0 in | Wt 156.7 lb

## 2021-05-30 DIAGNOSIS — I10 Essential (primary) hypertension: Secondary | ICD-10-CM | POA: Diagnosis not present

## 2021-05-30 DIAGNOSIS — I6789 Other cerebrovascular disease: Secondary | ICD-10-CM

## 2021-05-30 DIAGNOSIS — C7931 Secondary malignant neoplasm of brain: Secondary | ICD-10-CM | POA: Diagnosis not present

## 2021-05-30 DIAGNOSIS — R112 Nausea with vomiting, unspecified: Secondary | ICD-10-CM | POA: Diagnosis not present

## 2021-05-30 DIAGNOSIS — Z79899 Other long term (current) drug therapy: Secondary | ICD-10-CM | POA: Diagnosis not present

## 2021-05-30 DIAGNOSIS — Z86718 Personal history of other venous thrombosis and embolism: Secondary | ICD-10-CM | POA: Diagnosis not present

## 2021-05-30 DIAGNOSIS — E876 Hypokalemia: Secondary | ICD-10-CM | POA: Diagnosis not present

## 2021-05-30 DIAGNOSIS — Z5112 Encounter for antineoplastic immunotherapy: Secondary | ICD-10-CM | POA: Diagnosis not present

## 2021-05-30 DIAGNOSIS — C3431 Malignant neoplasm of lower lobe, right bronchus or lung: Secondary | ICD-10-CM | POA: Diagnosis not present

## 2021-05-30 DIAGNOSIS — R52 Pain, unspecified: Secondary | ICD-10-CM | POA: Diagnosis not present

## 2021-05-30 DIAGNOSIS — C778 Secondary and unspecified malignant neoplasm of lymph nodes of multiple regions: Secondary | ICD-10-CM | POA: Diagnosis not present

## 2021-05-30 DIAGNOSIS — Z5111 Encounter for antineoplastic chemotherapy: Secondary | ICD-10-CM | POA: Diagnosis not present

## 2021-05-30 DIAGNOSIS — Y842 Radiological procedure and radiotherapy as the cause of abnormal reaction of the patient, or of later complication, without mention of misadventure at the time of the procedure: Secondary | ICD-10-CM

## 2021-05-30 NOTE — Progress Notes (Signed)
[facility] [place], [place] [address] [phone]   Interval Evaluation  Date of Service: 05/30/21 Patient Name: Virginia [name] Patient MRN: [id number] Patient DOB: October 30, 1955 Provider: Ventura Sellers, MD  Identifying Statement:  Virginia [name] is a 65 y.o. 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 Isidore Moos 12/02/20: Craniotomy, resection of progressive cerebellar mass by Dr. Vertell Limber.  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 03/10/21: Completes 3 cycles avastin $RemoveBefore'10mg'iNsMkqPhvpRqQ$ /kg for refractory radionecrosis   Interval History:  Virginia [name] presents today after recent MRI brain.  Dizziness and balance issues are stable, though they were worse after she switched over to the hydrocortisone.  Currently she is dosing the decadron just $RemoveBefor'1mg'pcuVVmMEHbyA$  in AM.  Around the home she gets around independently, or sometimes with walker assist.  She otherwise describes no new or progressive neurologic deficits.  No seizures or headaches.  She continues on chemotherapy well with Dr. Lorenso Courier.  H+P (10/15/19) Patient presents to clinic to discuss recent neurologic symptoms and imaging findings.  She describes a fall while climbing stairs on 1/20; she hit the back of her and lost consciousness.  She is unclear whether she lost her balance or had mechanical provocation, but she had been drinking that evening.  She was treated initially as a trauma, CT demonstrated a lesion in the posterior fossa, later confirmed as a metastasis by MRI, along with 7 additional smaller lesions.  It took her almost two weeks to return for cognition and balance to return to "normal" which she feels she is about at right now.  Baseline she is high functioning and independent, works as a Neurosurgeon.  Has been taking decadron $RemoveBeforeD'2mg'EkJSvfdZzqkHgY$  twice per day.  Plans to initiate chemotherapy soon with  Dr. Lorenso Courier.  Medications: Current Outpatient Medications on File Prior to Visit  Medication Sig Dispense Refill   acetaminophen (TYLENOL) 500 MG tablet Take 1,000 mg by mouth every 8 (eight) hours as needed for moderate pain.     albuterol (VENTOLIN HFA) 108 (90 Base) MCG/ACT inhaler INHALE 2 PUFFS INTO THE LUNGS EVERY 6 HOURS AS NEEDED FOR WHEEZING OR SHORTNESS OF BREATH (Patient taking differently: Inhale 2 puffs into the lungs every 6 (six) hours as needed for shortness of breath or wheezing.) 6.7 g 11   ALPRAZolam (XANAX) 0.25 MG tablet Take 1 tablet (0.25 mg total) by mouth 2 (two) times daily as needed for anxiety 60 tablet 5   amLODipine (NORVASC) 10 MG tablet Take 1 tablet (10 mg total) by mouth daily. 30 tablet 3   Cyanocobalamin (VITAMIN B12 PO) Take 2 tablets by mouth daily. Gummies     cyclobenzaprine (FLEXERIL) 5 MG tablet Take 5 mg by mouth at bedtime.     dexamethasone (DECADRON) 1 MG tablet Take 2 tablets (2 mg total) by mouth daily. 60 tablet 1   folic acid (FOLVITE) 1 MG tablet Take 1 tablet by mouth daily. 90 tablet 1   furosemide (LASIX) 20 MG tablet Take 1 tablet (20 mg total) by mouth daily. 30 tablet 0   hydrocortisone (CORTEF) 10 MG tablet Take 2 tablets (20 mg total) by mouth daily. 90 tablet 1   lidocaine-prilocaine (EMLA) cream Apply to port as needed. 30 g 0   Multiple Vitamins-Minerals (AIRBORNE PO) Take 1 tablet by mouth daily.      ofloxacin (OCUFLOX) 0.3 % ophthalmic  solution 2 drops each eye 4 times a day for 5 days (Patient taking differently: Place 2 drops into both eyes daily as needed (irritation).) 5 mL 1   OLANZapine (ZYPREXA) 2.5 MG tablet TAKE 1 TABLET BY MOUTH EVERY NIGHT AT BEDTIME 90 tablet 1   ondansetron (ZOFRAN) 4 MG tablet TAKE 1 TO 2 TABLETS BY MOUTH 2 TIMES DAILY AS NEEDED 60 tablet 0   oxyCODONE (OXY IR/ROXICODONE) 5 MG immediate release tablet TAKE 1 TO 2 TABLETS BY MOUTH EVERY 4 HOURS AS NEEDED FOR SEVERE PAIN 90 tablet 0   pantoprazole  (PROTONIX) 40 MG tablet TAKE ONE TABLET BY MOUTH DAILY (Patient taking differently: Take 40 mg by mouth daily.) 90 tablet 3   pentoxifylline (TRENTAL) 400 MG CR tablet TAKE 1 TABLET BY MOUTH WITH FOOD FOR 1 WEEK THEN TAKE 1 TABLET BY MOUTH 2 TIMES DAILY THEREAFTER 60 tablet 2   polyethylene glycol (MIRALAX / GLYCOLAX) 17 g packet Take 17 g by mouth 2 (two) times daily.      prochlorperazine (COMPAZINE) 10 MG tablet Take 10 mg by mouth every 6 (six) hours as needed for nausea or vomiting.     promethazine (PHENERGAN) 12.5 MG tablet TAKE 1 TO 2 TABLETS BY MOUTH EVERY 4 HOURS AS NEEDED FOR REFRACTORY NAUSEA OR VOMITING 30 tablet 0   promethazine (PHENERGAN) 12.5 MG tablet Take 1 to 2 tablets by mouth every 6 hours as needed for nausea 20 tablet 0   Vitamin D3 (VITAMIN D) 25 MCG tablet Take 2,000 Units by mouth daily.     vitamin E 180 MG (400 UNITS) capsule Take 400 IU daily x 1 week, then 400 IU BID (Patient taking differently: Take 400 Units by mouth in the morning and at bedtime. Take 400 IU daily x 1 week, then 400 IU BID) 60 capsule 4   XTAMPZA ER 13.5 MG C12A Take 1 capsule by mouth in the morning and at bedtime. (every 12 hours) 60 capsule 0   No current facility-administered medications on file prior to visit.    Allergies: No Known Allergies Past Medical History:  Past Medical History:  Diagnosis Date   Anemia    Anxiety    Concussion 09/28/2019   DVT (deep venous thrombosis) ([facility]) 2021   L leg   Dyspnea    GERD (gastroesophageal reflux disease)    Hypercholesterolemia    per pt, she does not have elevated lipids   Hypertension    met lung ca dx'd 09/2019   mets to spine, hip and brain   PONV (postoperative nausea and vomiting)    Tobacco abuse    Past Surgical History:  Past Surgical History:  Procedure Laterality Date   ABDOMINAL HYSTERECTOMY     partial/ left ovaries   APPLICATION OF CRANIAL NAVIGATION N/A 12/02/2020   Procedure: APPLICATION OF CRANIAL NAVIGATION;   Surgeon: Erline Levine, MD;  Location: [facility];  Service: Neurosurgery;  Laterality: N/A;   CHOLECYSTECTOMY     CRANIOTOMY N/A 12/02/2020   Procedure: Posterior fossa craniotomy for tumor resection with brainlab;  Surgeon: Erline Levine, MD;  Location: [facility];  Service: Neurosurgery;  Laterality: N/A;   DILATION AND CURETTAGE OF UTERUS     IR IMAGING GUIDED PORT INSERTION  10/23/2019   KYPHOPLASTY N/A 03/15/2020   Procedure: Thoracic Eight KYPHOPLASTY;  Surgeon: Erline Levine, MD;  Location: [facility];  Service: Neurosurgery;  Laterality: N/A;  prone    LIPOMA EXCISION  2018   removed under left breast and  right thigh.   TUBAL LIGATION     Social History:  Social History   Socioeconomic History   Marital status: Married    Spouse name: Not on file   Number of children: 3   Years of education: Not on file   Highest education level: Not on file  Occupational History   Occupation: owns Academic librarian co    Employer: [employer]  Tobacco Use   Smoking status: Former    Packs/day: 0.25    Types: Cigarettes    Quit date: 10/31/2012    Years since quitting: 8.5   Smokeless tobacco: Never  Vaping Use   Vaping Use: Never used  Substance and Sexual Activity   Alcohol use: Yes    Alcohol/week: 0.0 standard drinks    Comment: rare   Drug use: No   Sexual activity: Yes  Other Topics Concern   Not on file  Social History Narrative   Not on file   Social Determinants of Health   Financial Resource Strain: Not on file  Food Insecurity: Not on file  Transportation Needs: Not on file  Physical Activity: Not on file  Stress: Not on file  Social Connections: Not on file  Intimate Partner Violence: Not on file   Family History:  Family History  Problem Relation Age of Onset   Heart disease Father    Drug abuse Daughter    Drug abuse Son    Cancer Sister    Heart disease Brother    Heart attack Brother     Review of Systems: Constitutional: Doesn't report fevers, chills or abnormal  weight loss Eyes: Doesn't report blurriness of vision Ears, nose, mouth, throat, and face: Doesn't report sore throat Respiratory: Doesn't report cough, dyspnea or wheezes Cardiovascular: Doesn't report palpitation, chest discomfort  Gastrointestinal:  Doesn't report nausea, constipation, diarrhea GU: Doesn't report incontinence Skin: Doesn't report skin rashes Neurological: Per HPI Musculoskeletal: Doesn't report joint pain Behavioral/Psych: Doesn't report anxiety  Physical Exam: Vitals:   05/30/21 1128  BP: 126/89  Pulse: 97  Resp: 17  Temp: 97.7 F (36.5 C)  SpO2: 99%   KPS: 90. General: Alert, cooperative, pleasant, in no acute distress Head: Normal EENT: No conjunctival injection or scleral icterus.  Lungs: Resp effort normal Cardiac: Regular rate Abdomen: Non-distended abdomen Skin: No rashes cyanosis or petechiae. Extremities: No clubbing or edema  Neurologic Exam: Mental Status: Awake, alert, attentive to examiner. Oriented to self and environment. Language is fluent with intact comprehension.  Cranial Nerves: Visual acuity is grossly normal. Visual fields are full. Extra-ocular movements intact. No ptosis. Face is symmetric Motor: Tone and bulk are normal. Power is full in both arms and legs. Reflexes are symmetric, no pathologic reflexes present.  Sensory: Intact to light touch Gait: Moderate impairment to tandem gait only  Labs: I have reviewed the data as listed    Component Value Date/Time   NA 141 05/12/2021 1020   K 3.5 05/12/2021 1020   CL 104 05/12/2021 1020   CO2 27 05/12/2021 1020   GLUCOSE 112 (H) 05/12/2021 1020   BUN 13 05/12/2021 1020   CREATININE 0.72 05/12/2021 1020   CREATININE 0.58 11/15/2017 0948   CALCIUM 9.1 05/12/2021 1020   PROT 7.0 05/12/2021 1020   ALBUMIN 3.3 (L) 05/12/2021 1020   AST 17 05/12/2021 1020   ALT 20 05/12/2021 1020   ALKPHOS 62 05/12/2021 1020   BILITOT 0.4 05/12/2021 1020   GFRNONAA >60 05/12/2021 1020    GFRNONAA 100 11/15/2017 0948  GFRAA >60 06/09/2020 1051   GFRAA 116 11/15/2017 0948   Lab Results  Component Value Date   WBC 9.9 05/12/2021   NEUTROABS 6.4 05/12/2021   HGB 13.4 05/12/2021   HCT 38.8 05/12/2021   MCV 97.5 05/12/2021   PLT 277 05/12/2021    Imaging:  [facility] Clinician Interpretation: I have personally reviewed the CNS images as listed.  My interpretation, in the context of the patient's clinical presentation, is stable disease  MR BRAIN W WO CONTRAST  Result Date: 05/26/2021 CLINICAL DATA:  Brain/CNS neoplasm, assess treatment response EXAM: MRI HEAD WITHOUT AND WITH CONTRAST TECHNIQUE: Multiplanar, multiecho pulse sequences of the brain and surrounding structures were obtained without and with intravenous contrast. CONTRAST:  33mL MULTIHANCE GADOBENATE DIMEGLUMINE 529 MG/ML IV SOLN COMPARISON:  03/17/2021. FINDINGS: Brain: Sequela of left suboccipital craniotomy and central left cerebellar resection with continued decreased size of the T2 hyperintense resection cavity anteriorly surrounding. FLAIR hyperintensity is not substantially changed. Restricted diffusion anteriorly in the region of the anterior left cerebellum and left middle cerebellar peduncles not substantially changed (series 3, images 51 through 53). No new areas of restricted diffusion. Other heterogeneous areas of enhancement in the cerebellar vermis and right cerebellar hemisphere appears similar to the prior. Slightly increased nonenhancing T2/FLAIR hyperintensity in the ventrolateral right medulla (inferior olivary nucleus) Small focus of enhancement in the right occipital lobe is similar (series 11, image 62). Subtle leptomeningeal enhancement in the right parietal lobe is similar (series 11, image 94). Small nodular focus of enhancement in the anterior left frontal lobe is similar (series 11, image 114). Small focus of nodular enhancement in the left parietal lobe near the motor strip is similar (series 11,  image 121). Stable mild nonspecific dural enhancement/thickening. No new enhancing lesions identified. Widespread patchy and confluent T2/FLAIR hyperintensity in the supratentorial white matter is similar. No midline shift, hydrocephalus, acute hemorrhage, extra-axial fluid collection or acute infarct. Unchanged pituitary appearance/enhancement. Vascular: Major arterial flow voids are maintained at the skull base. Skull and upper cervical spine: Small focus of enhancement in the posterior right calvarium appears unchanged dating back to 2021. Otherwise, normal marrow signal. Sinuses/Orbits: Mild scattered paranasal sinus mucosal thickening. Unremarkable orbits. Other: Small left mastoid effusion. IMPRESSION: 1. No substantial change in restricted diffusion in the left cerebellum/middle cerebellar peduncle and scattered areas of enhancement in the posterior fossa. Findings could represent treatment change/radiation necrosis and/or residual tumor. 2. Additional smaller supratentorial enhancing lesions are stable. Stable mild nonspecific dural enhancement/thickening. 3. Slightly increased nonenhancing T2/FLAIR hyperintensity in the ventrolateral right medulla (inferior olivary nucleus), which could represent hypertrophic olivary degeneration given abnormality in the contralateral left dentate nucleus, but warrants continued follow-up to exclude nonenhancing tumor. Electronically Signed   By: Margaretha Sheffield M.D.   On: 05/26/2021 11:47     Assessment/Plan Brain metastases ([facility])  Radiation therapy induced brain necrosis   Virginia [name] is clinically and radiographically stable today.  She exhibits deficits consistent with posterior fossa dysfunction, which are unchanged from prior.  Recommended trial of 0.$RemoveBe'5mg'toLFyJCWa$  daily decadron, or $RemoveBefo'1mg'WNGxMeOaPFo$  QOD.  We appreciate the opportunity to participate in the care of Virginia [name].   We ask that Virginia [name] return to clinic in 3 months following next brain MRI, or  sooner as needed.  All questions were answered. The patient knows to call the clinic with any problems, questions or concerns. No barriers to learning were detected.  The total time spent in the encounter was 30 minutes and more than 50%  was on counseling and review of test results   Ventura Sellers, MD Medical Director of Neuro-Oncology [facility] 05/30/21 11:36 AM

## 2021-05-31 ENCOUNTER — Telehealth: Payer: Self-pay | Admitting: Internal Medicine

## 2021-05-31 NOTE — Telephone Encounter (Signed)
Scheduled appt per 9/20 los. Pt's husband, [name], is aware.

## 2021-06-01 ENCOUNTER — Inpatient Hospital Stay: Payer: BC Managed Care – PPO

## 2021-06-01 ENCOUNTER — Other Ambulatory Visit: Payer: Self-pay

## 2021-06-01 ENCOUNTER — Inpatient Hospital Stay (HOSPITAL_BASED_OUTPATIENT_CLINIC_OR_DEPARTMENT_OTHER): Payer: BC Managed Care – PPO | Admitting: Hematology and Oncology

## 2021-06-01 VITALS — BP 136/97 | HR 97 | Temp 97.8°F | Resp 17 | Ht 62.0 in | Wt 160.0 lb

## 2021-06-01 DIAGNOSIS — Z5111 Encounter for antineoplastic chemotherapy: Secondary | ICD-10-CM | POA: Diagnosis not present

## 2021-06-01 DIAGNOSIS — Z95828 Presence of other vascular implants and grafts: Secondary | ICD-10-CM

## 2021-06-01 DIAGNOSIS — Z5112 Encounter for antineoplastic immunotherapy: Secondary | ICD-10-CM | POA: Diagnosis not present

## 2021-06-01 DIAGNOSIS — C7931 Secondary malignant neoplasm of brain: Secondary | ICD-10-CM

## 2021-06-01 DIAGNOSIS — R112 Nausea with vomiting, unspecified: Secondary | ICD-10-CM | POA: Diagnosis not present

## 2021-06-01 DIAGNOSIS — E876 Hypokalemia: Secondary | ICD-10-CM | POA: Diagnosis not present

## 2021-06-01 DIAGNOSIS — I10 Essential (primary) hypertension: Secondary | ICD-10-CM | POA: Diagnosis not present

## 2021-06-01 DIAGNOSIS — R918 Other nonspecific abnormal finding of lung field: Secondary | ICD-10-CM

## 2021-06-01 DIAGNOSIS — C778 Secondary and unspecified malignant neoplasm of lymph nodes of multiple regions: Secondary | ICD-10-CM | POA: Diagnosis not present

## 2021-06-01 DIAGNOSIS — C7951 Secondary malignant neoplasm of bone: Secondary | ICD-10-CM | POA: Diagnosis not present

## 2021-06-01 DIAGNOSIS — Z86718 Personal history of other venous thrombosis and embolism: Secondary | ICD-10-CM | POA: Diagnosis not present

## 2021-06-01 DIAGNOSIS — C3490 Malignant neoplasm of unspecified part of unspecified bronchus or lung: Secondary | ICD-10-CM

## 2021-06-01 DIAGNOSIS — R52 Pain, unspecified: Secondary | ICD-10-CM | POA: Diagnosis not present

## 2021-06-01 DIAGNOSIS — Z79899 Other long term (current) drug therapy: Secondary | ICD-10-CM | POA: Diagnosis not present

## 2021-06-01 DIAGNOSIS — C3431 Malignant neoplasm of lower lobe, right bronchus or lung: Secondary | ICD-10-CM | POA: Diagnosis not present

## 2021-06-01 LAB — CMP (CANCER CENTER ONLY)
ALT: 21 U/L (ref 0–44)
AST: 14 U/L — ABNORMAL LOW (ref 15–41)
Albumin: 3.3 g/dL — ABNORMAL LOW (ref 3.5–5.0)
Alkaline Phosphatase: 60 U/L (ref 38–126)
Anion gap: 10 (ref 5–15)
BUN: 20 mg/dL (ref 8–23)
CO2: 29 mmol/L (ref 22–32)
Calcium: 8.8 mg/dL — ABNORMAL LOW (ref 8.9–10.3)
Chloride: 101 mmol/L (ref 98–111)
Creatinine: 0.71 mg/dL (ref 0.44–1.00)
GFR, Estimated: >60 mL/min (ref >60)
Glucose, Bld: 117 mg/dL — ABNORMAL HIGH (ref 70–99)
Potassium: 3.4 mmol/L — ABNORMAL LOW (ref 3.5–5.1)
Sodium: 140 mmol/L (ref 135–145)
Total Bilirubin: 0.5 mg/dL (ref 0.3–1.2)
Total Protein: 6.9 g/dL (ref 6.5–8.1)

## 2021-06-01 LAB — CBC WITH DIFFERENTIAL (CANCER CENTER ONLY)
Abs Immature Granulocytes: 0.06 10*3/uL (ref 0.00–0.07)
Basophils Absolute: 0.1 10*3/uL (ref 0.0–0.1)
Basophils Relative: 1 %
Eosinophils Absolute: 0.1 10*3/uL (ref 0.0–0.5)
Eosinophils Relative: 1 %
HCT: 37.8 % (ref 36.0–46.0)
Hemoglobin: 13 g/dL (ref 12.0–15.0)
Immature Granulocytes: 1 %
Lymphocytes Relative: 30 %
Lymphs Abs: 2.6 10*3/uL (ref 0.7–4.0)
MCH: 34 pg (ref 26.0–34.0)
MCHC: 34.4 g/dL (ref 30.0–36.0)
MCV: 99 fL (ref 80.0–100.0)
Monocytes Absolute: 1.1 10*3/uL — ABNORMAL HIGH (ref 0.1–1.0)
Monocytes Relative: 12 %
Neutro Abs: 4.8 10*3/uL (ref 1.7–7.7)
Neutrophils Relative %: 55 %
Platelet Count: 291 10*3/uL (ref 150–400)
RBC: 3.82 MIL/uL — ABNORMAL LOW (ref 3.87–5.11)
RDW: 14.3 % (ref 11.5–15.5)
WBC Count: 8.6 10*3/uL (ref 4.0–10.5)
nRBC: 0 % (ref 0.0–0.2)

## 2021-06-01 LAB — PROTEIN / CREATININE RATIO, URINE
Creatinine, Urine: 15.15 mg/dL
Total Protein, Urine: <6 mg/dL

## 2021-06-01 LAB — LACTATE DEHYDROGENASE: LDH: 216 U/L — ABNORMAL HIGH (ref 98–192)

## 2021-06-01 MED ORDER — PROCHLORPERAZINE MALEATE 10 MG PO TABS
ORAL_TABLET | ORAL | Status: AC
  Administered 2021-06-01: 10 mg via ORAL
  Filled 2021-06-01: qty 1

## 2021-06-01 MED ORDER — SODIUM CHLORIDE 0.9% FLUSH
10.0000 mL | INTRAVENOUS | Status: DC | PRN
  Administered 2021-06-01: 10 mL

## 2021-06-01 MED ORDER — SODIUM CHLORIDE 0.9 % IV SOLN
Freq: Once | INTRAVENOUS | Status: AC
Start: 2021-06-01 — End: 2021-06-01

## 2021-06-01 MED ORDER — SODIUM CHLORIDE 0.9 % IV SOLN
500.0000 mg/m2 | Freq: Once | INTRAVENOUS | Status: AC
  Administered 2021-06-01: 900 mg via INTRAVENOUS
  Filled 2021-06-01: qty 16

## 2021-06-01 MED ORDER — SODIUM CHLORIDE 0.9% FLUSH
10.0000 mL | Freq: Once | INTRAVENOUS | Status: AC | PRN
  Administered 2021-06-01: 10 mL

## 2021-06-01 MED ORDER — CYANOCOBALAMIN 1000 MCG/ML IJ SOLN
1000.0000 ug | Freq: Once | INTRAMUSCULAR | Status: AC
  Administered 2021-06-01: 1000 ug via INTRAMUSCULAR
  Filled 2021-06-01: qty 1

## 2021-06-01 MED ORDER — PROCHLORPERAZINE MALEATE 10 MG PO TABS: 10.0000 mg | ORAL_TABLET | Freq: Once | ORAL | Status: AC

## 2021-06-01 MED ORDER — SODIUM CHLORIDE 0.9 % IV SOLN
200.0000 mg | Freq: Once | INTRAVENOUS | Status: AC
  Administered 2021-06-01: 200 mg via INTRAVENOUS
  Filled 2021-06-01: qty 8

## 2021-06-01 MED ORDER — HEPARIN SOD (PORK) LOCK FLUSH 100 UNIT/ML IV SOLN
500.0000 [IU] | Freq: Once | INTRAVENOUS | Status: AC | PRN
  Administered 2021-06-01: 500 [IU]

## 2021-06-01 NOTE — Progress Notes (Signed)
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 CT of the head showed showed concerning for metastatic disease involving the left cerebellum and right occipital lobe.  2) 09/30/2019: MRI brain confirms mass within the left cerebellum measures 1.6 x 1.3 x 1.5 cm as well as at least 8 metastatic lesions within the supratentorial and infratentorial brain as outlined 3) 10/01/2019: PET CT scan revealed hypermetabolic infrahilar right lower lobe mass with hypermetabolic nodal metastases in the right hilum, mediastinum, right supraclavicular region, left axilla and lower left neck. 4) 10/08/2019: US guided biopsy of the lymph nodes confirms poorly differentiated non-small cell carcinoma. Immunohistochemistry confirms adenocarcinoma of lung primary. PD-L1 and NGS later revealed a MET Exon 14 mutation and TPS of 90%.  5) 10/12/2019: Establish care with Dr. Lorenso Courier  6) 11/04/2019: Day 1 of capmatinib $RemoveBefor'400mg'[id number]$  BID 7) 11/09/2019: presented to Rad/Onc with a DVT in LLE. Seen in symptom management clinic and started on apixaban.  8) 12/29/2019: CT C/A/P showed interval decrease in size of mass involving the superior segment of right lower lobe and reduction in mediastinal and left supraclavicular adenopathy though some small spinal lesions were noted in the interim between the two sets of scans 9) 01/25/2020: temporarily stopped capmatinib due to symptoms of nausea/fatigue. Restarted therapy on 01/30/2020 after brief chemo holiday.  10) 03/22/2020: CT C/A/P reveals a mixed picture, with new lung nodule and  increased lymphadenopathy, however response in bone lesions. D/c capmatinib therapy 11) 04/08/2020: start of Carbo/Pem/Pem for 2nd line treatment. Cycle 1 Day 1   12) 04/29/2020: Cycle 2 Day 1 Carbo/Pem/Pem  13) 05/20/2020: Cycle 3 Day 1 Carbo/Pem/Pem  14) 06/09/2020: Cycle 4 Day 1 Carbo/Pem/Pem  15) 06/29/2020: Cycle 5 Day 1 maintenance Pem/Pem  16) 07/20/2020: Cycle 6 Day 1 maintenance Pem/Pem  17) 08/12/2020: Cycle 7 Day 1 maintenance Pem/Pem  18) 09/01/2020: Cycle 8 Day 1 maintenance Pem/Pem  19) 09/21/2020: Cycle 9 Day 1 maintenance Pem/Pem  20) 10/13/2020: Cycle 10 Day 1 maintenance Pem/Pem  21) 11/03/2020: Cycle 11 Day 1 maintenance Pem/Pem  22) 11/25/2020:  Cycle 12 Day 1 maintenance Pem/Pem 23) 12/15/2020:  Cycle 13 Day 1 maintenance Pem/Pem 24) 01/05/2021:  Cycle 14 Day 1 maintenance Pem/Pem 25) 01/26/2021:  Cycle 15 Day 1 maintenance Pem/Pem plus Becacizumab.  02/17/2021: Cycle 16 Day 1 maintenance Pem/Pem plus Becacizumab.  03/10/2021: Cycle 17 Day 1 maintenance Pem/Pem plus Becacizumab.  03/31/2021: Cycle 18 Day 1 maintenance Pem/Pem  04/20/2021:  Cycle 19 Day 1 maintenance Pem/Pem  05/12/2021: Cycle 20 Day 1 maintenance Pem/Pem 06/01/2021: Cycle 21 Day 1 maintenance Pem/Pem  Interval History:  Virginia [name] 65 y.o. female with medical history significant for metastatic adenocarcinoma of the lung presents for a follow up visit. The patient's last visit was on 05/12/2021. In the interim since the last visit, she has continued pem/pem maintenance. Today is Cycle 21 Day 1.   On exam today Virginia [name] is accompanied by her husband.  She reports she has been well overall in the interim since her last visit.  He notes that her appetite has been good and her energy has been excellent.  She continues  to have some swelling around her face but otherwise not in her lower extremities.  She is currently down to Decadron 1 mg p.o. daily.  She notes that she did switch to hydrocortisone briefly but did not help  with her balance issues.  She does endorse having shortness of breath but no overt wheezing.  Overall she is had no major changes in her health since her last visit.  She is not having any issues with bleeding or bruising.  She denies any fevers, chills, shortness of breath, chest pain, cough, neuropathy or skin changes. She has no other complaints.  A full 10 point ROS is listed below.   MEDICAL HISTORY:  Past Medical History:  Diagnosis Date   Anemia    Anxiety    Concussion 09/28/2019   DVT (deep venous thrombosis) ([place]) 2021   L leg   Dyspnea    GERD (gastroesophageal reflux disease)    Hypercholesterolemia    per pt, she does not have elevated lipids   Hypertension    met lung ca dx'd 09/2019   mets to spine, hip and brain   PONV (postoperative nausea and vomiting)    Tobacco abuse     SURGICAL HISTORY: Past Surgical History:  Procedure Laterality Date   ABDOMINAL HYSTERECTOMY     partial/ left ovaries   APPLICATION OF CRANIAL NAVIGATION N/A 12/02/2020   Procedure: APPLICATION OF CRANIAL NAVIGATION;  Surgeon: Erline Levine, MD;  Location: [facility];  Service: Neurosurgery;  Laterality: N/A;   CHOLECYSTECTOMY     CRANIOTOMY N/A 12/02/2020   Procedure: Posterior fossa craniotomy for tumor resection with brainlab;  Surgeon: Erline Levine, MD;  Location: [facility];  Service: Neurosurgery;  Laterality: N/A;   DILATION AND CURETTAGE OF UTERUS     IR IMAGING GUIDED PORT INSERTION  10/23/2019   KYPHOPLASTY N/A 03/15/2020   Procedure: Thoracic Eight KYPHOPLASTY;  Surgeon: Erline Levine, MD;  Location: [facility];  Service: Neurosurgery;  Laterality: N/A;  prone    LIPOMA EXCISION  2018   removed under left breast and right thigh.   TUBAL LIGATION      ALLERGIES:  has No Known Allergies.  MEDICATIONS:  Current Outpatient Medications  Medication Sig Dispense Refill   acetaminophen (TYLENOL) 500 MG tablet Take 1,000 mg by mouth every 8 (eight) hours as needed for moderate pain.     albuterol  (VENTOLIN HFA) 108 (90 Base) MCG/ACT inhaler INHALE 2 PUFFS INTO THE LUNGS EVERY 6 HOURS AS NEEDED FOR WHEEZING OR SHORTNESS OF BREATH (Patient taking differently: Inhale 2 puffs into the lungs every 6 (six) hours as needed for shortness of breath or wheezing.) 6.7 g 11   ALPRAZolam (XANAX) 0.25 MG tablet Take 1 tablet (0.25 mg total) by mouth 2 (two) times daily as needed for anxiety 60 tablet 5   amLODipine (NORVASC) 10 MG tablet Take 1 tablet (10 mg total) by mouth daily. 30 tablet 3   Cyanocobalamin (VITAMIN B12 PO) Take 2 tablets by mouth daily. Gummies     cyclobenzaprine (FLEXERIL) 5 MG tablet Take 5 mg by mouth at bedtime.     dexamethasone (DECADRON) 1 MG tablet Take 2 tablets (2 mg total) by mouth daily. 60 tablet 1   folic acid (FOLVITE) 1 MG tablet Take 1 tablet by mouth daily. 90 tablet 1   furosemide (LASIX) 20 MG tablet Take 1 tablet (20 mg total) by mouth daily. 30 tablet 0   hydrocortisone (CORTEF) 10 MG tablet Take 2 tablets (20 mg total)  by mouth daily. (Patient not taking: Reported on 05/30/2021) 90 tablet 1   lidocaine-prilocaine (EMLA) cream Apply to port as needed. 30 g 0   Multiple Vitamins-Minerals (AIRBORNE PO) Take 1 tablet by mouth daily.      ofloxacin (OCUFLOX) 0.3 % ophthalmic solution 2 drops each eye 4 times a day for 5 days (Patient taking differently: Place 2 drops into both eyes daily as needed (irritation).) 5 mL 1   OLANZapine (ZYPREXA) 2.5 MG tablet TAKE 1 TABLET BY MOUTH EVERY NIGHT AT BEDTIME 90 tablet 1   ondansetron (ZOFRAN) 4 MG tablet TAKE 1 TO 2 TABLETS BY MOUTH 2 TIMES DAILY AS NEEDED 60 tablet 0   oxyCODONE (OXY IR/ROXICODONE) 5 MG immediate release tablet TAKE 1 TO 2 TABLETS BY MOUTH EVERY 4 HOURS AS NEEDED FOR SEVERE PAIN 90 tablet 0   pantoprazole (PROTONIX) 40 MG tablet TAKE ONE TABLET BY MOUTH DAILY (Patient taking differently: Take 40 mg by mouth daily.) 90 tablet 3   pentoxifylline (TRENTAL) 400 MG CR tablet TAKE 1 TABLET BY MOUTH WITH FOOD FOR 1  WEEK THEN TAKE 1 TABLET BY MOUTH 2 TIMES DAILY THEREAFTER 60 tablet 2   polyethylene glycol (MIRALAX / GLYCOLAX) 17 g packet Take 17 g by mouth 2 (two) times daily.      prochlorperazine (COMPAZINE) 10 MG tablet Take 10 mg by mouth every 6 (six) hours as needed for nausea or vomiting.     promethazine (PHENERGAN) 12.5 MG tablet TAKE 1 TO 2 TABLETS BY MOUTH EVERY 4 HOURS AS NEEDED FOR REFRACTORY NAUSEA OR VOMITING 30 tablet 0   promethazine (PHENERGAN) 12.5 MG tablet Take 1 to 2 tablets by mouth every 6 hours as needed for nausea 20 tablet 0   Vitamin D3 (VITAMIN D) 25 MCG tablet Take 2,000 Units by mouth daily.     vitamin E 180 MG (400 UNITS) capsule Take 400 IU daily x 1 week, then 400 IU BID (Patient taking differently: Take 400 Units by mouth in the morning and at bedtime. Take 400 IU daily x 1 week, then 400 IU BID) 60 capsule 4   XTAMPZA ER 13.5 MG C12A Take 1 capsule by mouth in the morning and at bedtime. (every 12 hours) 60 capsule 0   No current facility-administered medications for this visit.   Facility-Administered Medications Ordered in Other Visits  Medication Dose Route Frequency Provider Last Rate Last Admin   heparin lock flush 100 unit/mL  500 Units Intracatheter Once PRN Orson Slick, MD       pembrolizumab [facility]) 200 mg in sodium chloride 0.9 % 50 mL chemo infusion  200 mg Intravenous Once Orson Slick, MD       PEMEtrexed (ALIMTA) 800 mg in sodium chloride 0.9 % 100 mL chemo infusion  500 mg/m2 (Order-Specific) Intravenous Once Ledell Peoples IV, MD       sodium chloride flush (NS) 0.9 % injection 10 mL  10 mL Intracatheter PRN Orson Slick, MD        REVIEW OF SYSTEMS:   Constitutional: ( - ) fevers, ( - )  chills , ( - ) night sweats Eyes: ( - ) blurriness of vision, ( - ) double vision, ( - ) watery eyes Ears, nose, mouth, throat, and face: ( - ) mucositis, ( - ) sore throat Respiratory: ( - ) cough, ( - ) dyspnea, ( - ) wheezes Cardiovascular: ( -  ) palpitation, ( - ) chest discomfort, ( - )  lower extremity swelling Gastrointestinal:  ( - ) nausea, ( - ) heartburn, ( - ) change in bowel habits Skin: ( - ) abnormal skin rashes Lymphatics: ( - ) new lymphadenopathy, ( - ) easy bruising Neurological: ( - ) numbness, ( - ) tingling, ( - ) new weaknesses Behavioral/Psych: ( - ) mood change, ( - ) new changes  All other systems were reviewed with the patient and are negative.  PHYSICAL EXAMINATION: ECOG PERFORMANCE STATUS: 1 - Symptomatic but completely ambulatory  Vitals:   06/01/21 1121  BP: (!) 136/97  Pulse: 97  Resp: 17  Temp: 97.8 F (36.6 C)  SpO2: 100%   Filed Weights   06/01/21 1121  Weight: 160 lb (72.6 kg)    GENERAL: well appearing middle aged Caucasian female in NAD  SKIN: skin color, texture, turgor are normal, no rashes or significant lesions EYES: conjunctiva are pink and non-injected, sclera clear LUNGS: wheezing at lung bases. clear to auscultation and percussion with normal breathing effort HEART: regular rate & rhythm and no murmurs and bilateral + trace pitting lower extremity edema Musculoskeletal: no cyanosis of digits and no clubbing PSYCH: alert & oriented x 3, fluent speech NEURO: no focal motor/sensory deficits    LABORATORY DATA:  I have reviewed the data as listed CBC Latest Ref Rng & Units 06/01/2021 05/12/2021 04/20/2021  WBC 4.0 - 10.5 K/uL 8.6 9.9 10.2  Hemoglobin 12.0 - 15.0 g/dL 13.0 13.4 12.7  Hematocrit 36.0 - 46.0 % 37.8 38.8 35.5(L)  Platelets 150 - 400 K/uL 291 277 312    CMP Latest Ref Rng & Units 06/01/2021 05/12/2021 04/20/2021  Glucose 70 - 99 mg/dL 117(H) 112(H) 131(H)  BUN 8 - 23 mg/dL $Remove'20 13 10  'jqnkSIG$ Creatinine 0.44 - 1.00 mg/dL 0.71 0.72 0.77  Sodium 135 - 145 mmol/L 140 141 144  Potassium 3.5 - 5.1 mmol/L 3.4(L) 3.5 3.1(L)  Chloride 98 - 111 mmol/L 101 104 102  CO2 22 - 32 mmol/L $RemoveB'29 27 29  'zVHDTfnK$ Calcium 8.9 - 10.3 mg/dL 8.8(L) 9.1 9.3  Total Protein 6.5 - 8.1 g/dL 6.9 7.0 6.9  Total  Bilirubin 0.3 - 1.2 mg/dL 0.5 0.4 0.3  Alkaline Phos 38 - 126 U/L 60 62 60  AST 15 - 41 U/L 14(L) 17 25  ALT 0 - 44 U/L 21 20 34     RADIOGRAPHIC STUDIES: I personally have viewed the radiographic studies below: MR BRAIN W WO CONTRAST  Result Date: 05/26/2021 CLINICAL DATA:  Brain/CNS neoplasm, assess treatment response EXAM: MRI HEAD WITHOUT AND WITH CONTRAST TECHNIQUE: Multiplanar, multiecho pulse sequences of the brain and surrounding structures were obtained without and with intravenous contrast. CONTRAST:  7mL MULTIHANCE GADOBENATE DIMEGLUMINE 529 MG/ML IV SOLN COMPARISON:  03/17/2021. FINDINGS: Brain: Sequela of left suboccipital craniotomy and central left cerebellar resection with continued decreased size of the T2 hyperintense resection cavity anteriorly surrounding. FLAIR hyperintensity is not substantially changed. Restricted diffusion anteriorly in the region of the anterior left cerebellum and left middle cerebellar peduncles not substantially changed (series 3, images 51 through 53). No new areas of restricted diffusion. Other heterogeneous areas of enhancement in the cerebellar vermis and right cerebellar hemisphere appears similar to the prior. Slightly increased nonenhancing T2/FLAIR hyperintensity in the ventrolateral right medulla (inferior olivary nucleus) Small focus of enhancement in the right occipital lobe is similar (series 11, image 62). Subtle leptomeningeal enhancement in the right parietal lobe is similar (series 11, image 94). Small nodular focus of enhancement in the anterior left  frontal lobe is similar (series 11, image 114). Small focus of nodular enhancement in the left parietal lobe near the motor strip is similar (series 11, image 121). Stable mild nonspecific dural enhancement/thickening. No new enhancing lesions identified. Widespread patchy and confluent T2/FLAIR hyperintensity in the supratentorial white matter is similar. No midline shift, hydrocephalus, acute  hemorrhage, extra-axial fluid collection or acute infarct. Unchanged pituitary appearance/enhancement. Vascular: Major arterial flow voids are maintained at the skull base. Skull and upper cervical spine: Small focus of enhancement in the posterior right calvarium appears unchanged dating back to 2021. Otherwise, normal marrow signal. Sinuses/Orbits: Mild scattered paranasal sinus mucosal thickening. Unremarkable orbits. Other: Small left mastoid effusion. IMPRESSION: 1. No substantial change in restricted diffusion in the left cerebellum/middle cerebellar peduncle and scattered areas of enhancement in the posterior fossa. Findings could represent treatment change/radiation necrosis and/or residual tumor. 2. Additional smaller supratentorial enhancing lesions are stable. Stable mild nonspecific dural enhancement/thickening. 3. Slightly increased nonenhancing T2/FLAIR hyperintensity in the ventrolateral right medulla (inferior olivary nucleus), which could represent hypertrophic olivary degeneration given abnormality in the contralateral left dentate nucleus, but warrants continued follow-up to exclude nonenhancing tumor. Electronically Signed   By: Margaretha Sheffield M.D.   On: 05/26/2021 11:47     ASSESSMENT & PLAN DAKODA BASSETTE 65 y.o. female with medical history significant for metastatic adenocarcinoma of the lung presents for a follow up visit. 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   Previously we discussed Carboplatin/Pembrolizumab/Pemetrexed and the expected side effect from these medications.  We discussed the possible side effects of immunotherapy including colitis, hepatitis, dermatitis, and pneumonitis.  Additionally we discussed the side effects of carboplatin which include suppression of blood counts, nephrotoxicity, and nausea.  Additionally we discussed pemetrexed which can also have effects on counts and would require supplementation with vitamin M25  and folic acid.  She voiced her understanding of these side effects and treatment plans moving forward.  I also noted that we would be able to drop the chemotherapy agents that she had intolerance to continue with pembrolizumab alone given her high TPS score.  The patient has a markedly elevated TPS score (90%)   Mrs. Andrus continues to tolerate the treatment. Her labs from today were reviewed without any intervention needed. Patient will proceed with treatment as planned and return to the clinic in 3 weeks prior to her next cycle.   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 Ended on 03/10/2021.  --patient will be followed every 3 weeks with labs and clinic visit. --port in place, patient completed chemotherapy education.  --plan for repeat CT C/A/P in Oct 2022 (q 3 months) --supportive therapy as listed below.  --RTC q 3 weeks for continued monitoring/chemotherapy treatments.    #Nausea/Vomiting, improving -- provided patient with Zofran 4-8mg  q8h PRN and compazine 10mg  q6H for breakthrough PRN.  She is currently using Phenergan for breakthrough. --continue olanzpaine 2.5 mg PO QHS to help with nausea. Marked improvement since the addition of this medication on 01/29/2020.  --continue to monitor   #Hypokalemia, stable --likely 2/2 to decreased PO intake, hypovolemia, and BP medications --Potassium level 3.5 today.   --continue to monitor  #Pain Control, stable --continue oxycodone 5mg  q4H PRN. Can increase dose to 10mg  q6H if pain is  severe. She is taking approximately 2-3 pills per day.  --continue Xtampza ER BID for long acting support.  --continue to take with Senokot to prevent opioid induced constipation. Miralax PRN   #Supportive Therapy --continue EMLA cream with port --zometa 4g IV q 12  weeks for bone metastasis, dental clearance received.  --nausea as above    #Brain Metastasis, progressing --appreciate the assistance of Dr. 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 --radiation therapy to the brain completed on 11/16/2019.  --MRI brain on 04/29/2020 showed evidence of small brain bleed. Eliquis therapy was discontinued.  --MRI on 11/04/2020 showed concern for progression of intracranial disease.  --On 12/02/2020, patient underwent craniotomy, resection of progressive cerebellar mass by Dr. Vertell Limber. 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  --MRI on 12/23/2020 showed evidence of radiation necrosis. Dr. Mickeal Skinner evaluated the patient on 01/02/2021 with recommendations to add IV Bevacizumab 10 mg/kg q 3 weeks starting today (01/26/2021). --she has started hydrocortisone per his recommendations.  Bevacizumab has been discontinued and is not planned for today's regimen.  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    # Hypertension: --BP at today's visit was 116/93. Continue Norvasc to 10 mg daily.  --continue to monitor at home and in clinic.    No orders of the defined types were placed in this encounter.  All questions were answered. The patient knows to call the clinic with any problems, questions or concerns.  A total of more than 30 minutes were spent on this encounter and over half of that time was spent on counseling and coordination of care as outlined above.   Ledell Peoples, MD Department of Hematology/Oncology [place] at [facility] Phone: [phone] Pager: [phone] Email: [email or web address]   06/01/2021 12:28 PM   Literature Support:  Lorenza Chick, Rodrguez-Abreu D, Duffy Rhody, Felip E, De Angelis F, Domine M, [place], Hochmair MJ, [place], [place], Bischoff HG,  [place], Grossi F, [place], Reck M, [place], [place], [place], Rubio-Viqueira B, Novello S, Kurata T, Gray JE, Vida J, Wei Z, Yang J, Raftopoulos H, [place], Idalou [employer]; [id number] Investigators. Pembrolizumab plus Chemotherapy in Metastatic Non-Small-Cell Lung Cancer. [employer] Med. 2018 May 31;378(22):2078-2092.  --Median progression-free survival was 8.8 months (95% CI, 7.6 to 9.2) in the pembrolizumab-combination group and 4.9 months (95% CI, 4.7 to 5.5) in the placebo-combination group (hazard ratio for disease progression or death, 0.52; 95% CI, 0.43 to 0.64; P<0.001). Adverse events of grade 3 or higher occurred in 67.2% of the patients in the pembrolizumab-combination group and in 65.8% of those in the placebo-combination group.  Jodene Nam L. Efficacy of pemetrexed-based regimens in advanced non-small cell lung cancer patients with activating epidermal growth factor receptor mutations after tyrosine kinase inhibitor failure: a systematic review. Onco Targets Ther. 2018;11:2121-2129.   --The weighted median PFS, median OS, and ORR for patients treated with pem regimens were 5.09 months, 15.91 months, and 30.19%, respectively. Our systematic review results showed a favorable efficacy profile of pem regimens in NSCLC patients with EGFR mutation after EGFR-TKI failure.

## 2021-06-01 NOTE — Patient Instructions (Signed)
[facility]  Discharge Instructions: Thank you for choosing [facility] to provide your oncology and hematology care.   If you have a lab appointment with the Cancer Center, please go directly to the Cancer Center and check in at the registration area.   Wear comfortable clothing and clothing appropriate for easy access to any Portacath or PICC line.   We strive to give you quality time with your provider. You may need to reschedule your appointment if you arrive late (15 or more minutes).  Arriving late affects you and other patients whose appointments are after yours.  Also, if you miss three or more appointments without notifying the office, you may be dismissed from the clinic at the provider's discretion.      For prescription refill requests, have your pharmacy contact our office and allow 72 hours for refills to be completed.    Today you received the following chemotherapy and/or immunotherapy agents Pembrolizumab (Keytruda) and Pemetrexed (Alimta)      To help prevent nausea and vomiting after your treatment, we encourage you to take your nausea medication as directed.  BELOW ARE SYMPTOMS THAT SHOULD BE REPORTED IMMEDIATELY: *FEVER GREATER THAN 100.4 F (38 C) OR HIGHER *CHILLS OR SWEATING *NAUSEA AND VOMITING THAT IS NOT CONTROLLED WITH YOUR NAUSEA MEDICATION *UNUSUAL SHORTNESS OF BREATH *UNUSUAL BRUISING OR BLEEDING *URINARY PROBLEMS (pain or burning when urinating, or frequent urination) *BOWEL PROBLEMS (unusual diarrhea, constipation, pain near the anus) TENDERNESS IN MOUTH AND THROAT WITH OR WITHOUT PRESENCE OF ULCERS (sore throat, sores in mouth, or a toothache) UNUSUAL RASH, SWELLING OR PAIN  UNUSUAL VAGINAL DISCHARGE OR ITCHING   Items with * indicate a potential emergency and should be followed up as soon as possible or go to the Emergency Department if any problems should occur.  Please show the CHEMOTHERAPY ALERT CARD or  IMMUNOTHERAPY ALERT CARD at check-in to the Emergency Department and triage nurse.  Should you have questions after your visit or need to cancel or reschedule your appointment, please contact [facility]  Dept: [phone]  and follow the prompts.  Office hours are 8:00 a.m. to 4:30 p.m. Monday - Friday. Please note that voicemails left after 4:00 p.m. may not be returned until the following business day.  We are closed weekends and major holidays. You have access to a nurse at all times for urgent questions. Please call the main number to the clinic Dept: [phone] and follow the prompts.   For any non-urgent questions, you may also contact your provider using MyChart. We now offer e-Visits for anyone 18 and older to request care online for non-urgent symptoms. For details visit [email or web address].   Also download the MyChart app! Go to the app store, search "MyChart", open the app, select [facility], and log in with your MyChart username and password.  Due to Covid, a mask is required upon entering the hospital/clinic. If you do not have a mask, one will be given to you upon arrival. For doctor visits, patients may have 1 support person aged 18 or older with them. For treatment visits, patients cannot have anyone with them due to current Covid guidelines and our immunocompromised population.   

## 2021-06-01 NOTE — Progress Notes (Signed)
OK to increase Alimta dose using today's weight per Dr. Lorenso Courier.  Clarification w/ Dr. Lorenso Courier - pt is no longer on Bevacizumab.    Kennith Center, Pharm.D., CPP 06/01/2021@12 :47 PM

## 2021-06-02 ENCOUNTER — Other Ambulatory Visit: Payer: BC Managed Care – PPO

## 2021-06-02 ENCOUNTER — Ambulatory Visit: Payer: BC Managed Care – PPO

## 2021-06-02 ENCOUNTER — Ambulatory Visit: Payer: BC Managed Care – PPO | Admitting: Hematology and Oncology

## 2021-06-06 ENCOUNTER — Other Ambulatory Visit: Payer: Self-pay | Admitting: Radiation Therapy

## 2021-06-11 ENCOUNTER — Other Ambulatory Visit: Payer: Self-pay | Admitting: Hematology and Oncology

## 2021-06-11 MED ORDER — FUROSEMIDE 20 MG PO TABS
20.0000 mg | ORAL_TABLET | Freq: Every day | ORAL | 0 refills | Status: DC
  Filled 2021-06-11: qty 30, 30d supply, fill #0

## 2021-06-11 MED ORDER — AMLODIPINE BESYLATE 10 MG PO TABS
10.0000 mg | ORAL_TABLET | Freq: Every day | ORAL | 3 refills | Status: DC
  Filled 2021-06-11: qty 30, 30d supply, fill #0

## 2021-06-12 ENCOUNTER — Other Ambulatory Visit (HOSPITAL_COMMUNITY): Payer: Self-pay

## 2021-06-15 ENCOUNTER — Other Ambulatory Visit (HOSPITAL_COMMUNITY): Payer: Self-pay

## 2021-06-19 DIAGNOSIS — I6789 Other cerebrovascular disease: Secondary | ICD-10-CM | POA: Diagnosis not present

## 2021-06-21 ENCOUNTER — Ambulatory Visit (HOSPITAL_COMMUNITY)
Admission: RE | Admit: 2021-06-21 | Discharge: 2021-06-21 | Disposition: A | Discharge status: Home / Self Care | Payer: BC Managed Care – PPO | Source: Ambulatory Visit | Attending: Hematology and Oncology | Admitting: Hematology and Oncology

## 2021-06-21 DIAGNOSIS — J941 Fibrothorax: Secondary | ICD-10-CM | POA: Diagnosis not present

## 2021-06-21 DIAGNOSIS — J189 Pneumonia, unspecified organism: Secondary | ICD-10-CM | POA: Diagnosis not present

## 2021-06-21 DIAGNOSIS — C3490 Malignant neoplasm of unspecified part of unspecified bronchus or lung: Secondary | ICD-10-CM | POA: Insufficient documentation

## 2021-06-21 DIAGNOSIS — I700 Atherosclerosis of aorta: Secondary | ICD-10-CM | POA: Diagnosis not present

## 2021-06-21 DIAGNOSIS — J8410 Pulmonary fibrosis, unspecified: Secondary | ICD-10-CM | POA: Diagnosis not present

## 2021-06-21 DIAGNOSIS — C7951 Secondary malignant neoplasm of bone: Secondary | ICD-10-CM | POA: Diagnosis not present

## 2021-06-21 DIAGNOSIS — I2510 Atherosclerotic heart disease of native coronary artery without angina pectoris: Secondary | ICD-10-CM | POA: Diagnosis not present

## 2021-06-21 MED ORDER — IOHEXOL 350 MG/ML SOLN
80.0000 mL | Freq: Once | INTRAVENOUS | Status: AC | PRN
  Administered 2021-06-21: 80 mL via INTRAVENOUS

## 2021-06-22 ENCOUNTER — Inpatient Hospital Stay: Payer: BC Managed Care – PPO | Attending: Hematology and Oncology

## 2021-06-22 ENCOUNTER — Inpatient Hospital Stay: Payer: BC Managed Care – PPO

## 2021-06-22 ENCOUNTER — Other Ambulatory Visit (HOSPITAL_COMMUNITY): Payer: Self-pay

## 2021-06-22 ENCOUNTER — Other Ambulatory Visit: Payer: Self-pay

## 2021-06-22 ENCOUNTER — Inpatient Hospital Stay (HOSPITAL_BASED_OUTPATIENT_CLINIC_OR_DEPARTMENT_OTHER): Payer: BC Managed Care – PPO | Admitting: Hematology and Oncology

## 2021-06-22 VITALS — BP 135/91 | HR 105 | Temp 98.2°F | Resp 18 | Wt 159.8 lb

## 2021-06-22 VITALS — HR 99 | Resp 18

## 2021-06-22 DIAGNOSIS — R42 Dizziness and giddiness: Secondary | ICD-10-CM | POA: Insufficient documentation

## 2021-06-22 DIAGNOSIS — C7931 Secondary malignant neoplasm of brain: Secondary | ICD-10-CM | POA: Diagnosis not present

## 2021-06-22 DIAGNOSIS — Z23 Encounter for immunization: Secondary | ICD-10-CM | POA: Insufficient documentation

## 2021-06-22 DIAGNOSIS — Z5111 Encounter for antineoplastic chemotherapy: Secondary | ICD-10-CM | POA: Diagnosis not present

## 2021-06-22 DIAGNOSIS — C7951 Secondary malignant neoplasm of bone: Secondary | ICD-10-CM | POA: Diagnosis not present

## 2021-06-22 DIAGNOSIS — I10 Essential (primary) hypertension: Secondary | ICD-10-CM | POA: Diagnosis not present

## 2021-06-22 DIAGNOSIS — Z79899 Other long term (current) drug therapy: Secondary | ICD-10-CM | POA: Diagnosis not present

## 2021-06-22 DIAGNOSIS — R2681 Unsteadiness on feet: Secondary | ICD-10-CM | POA: Diagnosis not present

## 2021-06-22 DIAGNOSIS — R918 Other nonspecific abnormal finding of lung field: Secondary | ICD-10-CM

## 2021-06-22 DIAGNOSIS — Z5112 Encounter for antineoplastic immunotherapy: Secondary | ICD-10-CM | POA: Insufficient documentation

## 2021-06-22 DIAGNOSIS — Z86718 Personal history of other venous thrombosis and embolism: Secondary | ICD-10-CM | POA: Diagnosis not present

## 2021-06-22 DIAGNOSIS — C3431 Malignant neoplasm of lower lobe, right bronchus or lung: Secondary | ICD-10-CM | POA: Diagnosis not present

## 2021-06-22 DIAGNOSIS — E876 Hypokalemia: Secondary | ICD-10-CM | POA: Insufficient documentation

## 2021-06-22 DIAGNOSIS — Z923 Personal history of irradiation: Secondary | ICD-10-CM | POA: Insufficient documentation

## 2021-06-22 DIAGNOSIS — Z9049 Acquired absence of other specified parts of digestive tract: Secondary | ICD-10-CM | POA: Insufficient documentation

## 2021-06-22 DIAGNOSIS — C3490 Malignant neoplasm of unspecified part of unspecified bronchus or lung: Secondary | ICD-10-CM

## 2021-06-22 DIAGNOSIS — R112 Nausea with vomiting, unspecified: Secondary | ICD-10-CM | POA: Insufficient documentation

## 2021-06-22 DIAGNOSIS — Z7952 Long term (current) use of systemic steroids: Secondary | ICD-10-CM | POA: Insufficient documentation

## 2021-06-22 DIAGNOSIS — Z7901 Long term (current) use of anticoagulants: Secondary | ICD-10-CM | POA: Insufficient documentation

## 2021-06-22 DIAGNOSIS — Z95828 Presence of other vascular implants and grafts: Secondary | ICD-10-CM

## 2021-06-22 LAB — CBC WITH DIFFERENTIAL (CANCER CENTER ONLY)
Abs Immature Granulocytes: 0.03 10*3/uL (ref 0.00–0.07)
Basophils Absolute: 0 10*3/uL (ref 0.0–0.1)
Basophils Relative: 1 %
Eosinophils Absolute: 0.1 10*3/uL (ref 0.0–0.5)
Eosinophils Relative: 2 %
HCT: 33.6 % — ABNORMAL LOW (ref 36.0–46.0)
Hemoglobin: 12 g/dL (ref 12.0–15.0)
Immature Granulocytes: 1 %
Lymphocytes Relative: 25 %
Lymphs Abs: 1.4 10*3/uL (ref 0.7–4.0)
MCH: 34.4 pg — ABNORMAL HIGH (ref 26.0–34.0)
MCHC: 35.7 g/dL (ref 30.0–36.0)
MCV: 96.3 fL (ref 80.0–100.0)
Monocytes Absolute: 1.1 10*3/uL — ABNORMAL HIGH (ref 0.1–1.0)
Monocytes Relative: 19 %
Neutro Abs: 3 10*3/uL (ref 1.7–7.7)
Neutrophils Relative %: 52 %
Platelet Count: 251 10*3/uL (ref 150–400)
RBC: 3.49 MIL/uL — ABNORMAL LOW (ref 3.87–5.11)
RDW: 14.3 % (ref 11.5–15.5)
WBC Count: 5.7 10*3/uL (ref 4.0–10.5)
nRBC: 0 % (ref 0.0–0.2)

## 2021-06-22 LAB — CMP (CANCER CENTER ONLY)
ALT: 23 U/L (ref 0–44)
AST: 21 U/L (ref 15–41)
Albumin: 3.2 g/dL — ABNORMAL LOW (ref 3.5–5.0)
Alkaline Phosphatase: 62 U/L (ref 38–126)
Anion gap: 8 (ref 5–15)
BUN: 9 mg/dL (ref 8–23)
CO2: 28 mmol/L (ref 22–32)
Calcium: 8.7 mg/dL — ABNORMAL LOW (ref 8.9–10.3)
Chloride: 102 mmol/L (ref 98–111)
Creatinine: 0.56 mg/dL (ref 0.44–1.00)
GFR, Estimated: >60 mL/min (ref >60)
Glucose, Bld: 123 mg/dL — ABNORMAL HIGH (ref 70–99)
Potassium: 3 mmol/L — ABNORMAL LOW (ref 3.5–5.1)
Sodium: 138 mmol/L (ref 135–145)
Total Bilirubin: 0.7 mg/dL (ref 0.3–1.2)
Total Protein: 6.5 g/dL (ref 6.5–8.1)

## 2021-06-22 LAB — PROTEIN / CREATININE RATIO, URINE
Creatinine, Urine: 70.29 mg/dL
Protein Creatinine Ratio: 0.07 mg/mg{Cre} (ref 0.00–0.15)
Total Protein, Urine: <6 mg/dL

## 2021-06-22 LAB — LACTATE DEHYDROGENASE: LDH: 182 U/L (ref 98–192)

## 2021-06-22 MED ORDER — INFLUENZA VAC SPLIT QUAD 0.5 ML IM SUSY
0.5000 mL | PREFILLED_SYRINGE | Freq: Once | INTRAMUSCULAR | Status: AC
  Administered 2021-06-22: 0.5 mL via INTRAMUSCULAR

## 2021-06-22 MED ORDER — XTAMPZA ER 13.5 MG PO C12A
1.0000 | EXTENDED_RELEASE_CAPSULE | Freq: Two times a day (BID) | ORAL | 0 refills | Status: DC
  Filled 2021-06-22: qty 60, 30d supply, fill #0

## 2021-06-22 MED ORDER — PROCHLORPERAZINE MALEATE 10 MG PO TABS
10.0000 mg | ORAL_TABLET | Freq: Once | ORAL | Status: AC
Start: 2021-06-22 — End: 2021-06-22
  Administered 2021-06-22: 10 mg via ORAL
  Filled 2021-06-22: qty 1

## 2021-06-22 MED ORDER — SODIUM CHLORIDE 0.9 % IV SOLN
500.0000 mg/m2 | Freq: Once | INTRAVENOUS | Status: AC
  Administered 2021-06-22: 900 mg via INTRAVENOUS
  Filled 2021-06-22: qty 20

## 2021-06-22 MED ORDER — SODIUM CHLORIDE 0.9 % IV SOLN: Freq: Once | INTRAVENOUS | Status: AC

## 2021-06-22 MED ORDER — OXYCODONE HCL 5 MG PO TABS
ORAL_TABLET | ORAL | 0 refills | Status: DC
  Filled 2021-06-22: qty 90, 10d supply, fill #0

## 2021-06-22 MED ORDER — SODIUM CHLORIDE 0.9 % IV SOLN
200.0000 mg | Freq: Once | INTRAVENOUS | Status: AC
  Administered 2021-06-22: 200 mg via INTRAVENOUS
  Filled 2021-06-22: qty 8

## 2021-06-22 MED ORDER — INFLUENZA VAC SPLIT QUAD 0.5 ML IM SUSY
PREFILLED_SYRINGE | INTRAMUSCULAR | Status: AC
  Filled 2021-06-22: qty 0.5

## 2021-06-22 MED ORDER — SODIUM CHLORIDE 0.9% FLUSH
10.0000 mL | Freq: Once | INTRAVENOUS | Status: AC | PRN
  Administered 2021-06-22: 10 mL

## 2021-06-22 MED ORDER — SODIUM CHLORIDE 0.9% FLUSH
10.0000 mL | INTRAVENOUS | Status: DC | PRN
  Administered 2021-06-22: 10 mL

## 2021-06-22 MED ORDER — HEPARIN SOD (PORK) LOCK FLUSH 100 UNIT/ML IV SOLN
500.0000 [IU] | Freq: Once | INTRAVENOUS | Status: AC | PRN
Start: 2021-06-22 — End: 2021-06-22
  Administered 2021-06-22: 500 [IU]

## 2021-06-22 NOTE — Progress Notes (Signed)
Per Dr. Lorenso Courier, ok to proceed with treatment today without urine and CMP results.

## 2021-06-22 NOTE — Patient Instructions (Signed)
[facility]   Discharge Instructions: Thank you for choosing [facility] to provide your oncology and hematology care.   If you have a lab appointment with the [facility], please go directly to the [facility] and check in at the registration area.   Wear comfortable clothing and clothing appropriate for easy access to any Portacath or PICC line.   We strive to give you quality time with your provider. You may need to reschedule your appointment if you arrive late (15 or more minutes).  Arriving late affects you and other patients whose appointments are after yours.  Also, if you miss three or more appointments without notifying the office, you may be dismissed from the clinic at the provider's discretion.      For prescription refill requests, have your pharmacy contact our office and allow 72 hours for refills to be completed.    Today you received the following chemotherapy and/or immunotherapy agents: pembrolizumab and pemetrexed.      To help prevent nausea and vomiting after your treatment, we encourage you to take your nausea medication as directed.  BELOW ARE SYMPTOMS THAT SHOULD BE REPORTED IMMEDIATELY: *FEVER GREATER THAN 100.4 F (38 C) OR HIGHER *CHILLS OR SWEATING *NAUSEA AND VOMITING THAT IS NOT CONTROLLED WITH YOUR NAUSEA MEDICATION *UNUSUAL SHORTNESS OF BREATH *UNUSUAL BRUISING OR BLEEDING *URINARY PROBLEMS (pain or burning when urinating, or frequent urination) *BOWEL PROBLEMS (unusual diarrhea, constipation, pain near the anus) TENDERNESS IN MOUTH AND THROAT WITH OR WITHOUT PRESENCE OF ULCERS (sore throat, sores in mouth, or a toothache) UNUSUAL RASH, SWELLING OR PAIN  UNUSUAL VAGINAL DISCHARGE OR ITCHING   Items with * indicate a potential emergency and should be followed up as soon as possible or go to the Emergency Department if any problems should occur.  Please show the CHEMOTHERAPY ALERT CARD or IMMUNOTHERAPY  ALERT CARD at check-in to the Emergency Department and triage nurse.  Should you have questions after your visit or need to cancel or reschedule your appointment, please contact [facility]  Dept: [phone]  and follow the prompts.  Office hours are 8:00 a.m. to 4:30 p.m. Monday - Friday. Please note that voicemails left after 4:00 p.m. may not be returned until the following business day.  We are closed weekends and major holidays. You have access to a nurse at all times for urgent questions. Please call the main number to the clinic Dept: [phone] and follow the prompts.   For any non-urgent questions, you may also contact your provider using MyChart. We now offer e-Visits for anyone 71 and older to request care online for non-urgent symptoms. For details visit [email or web address].   Also download the MyChart app! Go to the app store, search "MyChart", open the app, select [facility], and log in with your MyChart username and password.  Due to Covid, a mask is required upon entering the hospital/clinic. If you do not have a mask, one will be given to you upon arrival. For doctor visits, patients may have 1 support person aged 68 or older with them. For treatment visits, patients cannot have anyone with them due to current Covid guidelines and our immunocompromised population.

## 2021-06-22 NOTE — Progress Notes (Signed)
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 CT of the head showed showed concerning for metastatic disease involving the left cerebellum and right occipital lobe.  2) 09/30/2019: MRI brain confirms mass within the left cerebellum measures 1.6 x 1.3 x 1.5 cm as well as at least 8 metastatic lesions within the supratentorial and infratentorial brain as outlined 3) 10/01/2019: PET CT scan revealed hypermetabolic infrahilar right lower lobe mass with hypermetabolic nodal metastases in the right hilum, mediastinum, right supraclavicular region, left axilla and lower left neck. 4) 10/08/2019: US guided biopsy of the lymph nodes confirms poorly differentiated non-small cell carcinoma. Immunohistochemistry confirms adenocarcinoma of lung primary. PD-L1 and NGS later revealed a MET Exon 14 mutation and TPS of 90%.  5) 10/12/2019: Establish care with Dr. Lorenso Courier  6) 11/04/2019: Day 1 of capmatinib 437m BID 7) 11/09/2019: presented to Rad/Onc with a DVT in LLE. Seen in symptom management clinic and started on apixaban.  8) 12/29/2019: CT C/A/P showed interval decrease in size of mass involving the superior segment of right lower lobe and reduction in mediastinal and left supraclavicular adenopathy though some small spinal lesions were noted in the interim between the two sets of scans 9) 01/25/2020: temporarily stopped capmatinib due to symptoms of nausea/fatigue. Restarted therapy on 01/30/2020 after brief chemo holiday.  10) 03/22/2020: CT C/A/P reveals a mixed picture, with new lung nodule and  increased lymphadenopathy, however response in bone lesions. D/c capmatinib therapy 11) 04/08/2020: start of Carbo/Pem/Pem for 2nd line treatment. Cycle 1 Day 1   12) 04/29/2020: Cycle 2 Day 1 Carbo/Pem/Pem  13) 05/20/2020: Cycle 3 Day 1 Carbo/Pem/Pem  14) 06/09/2020: Cycle 4 Day 1 Carbo/Pem/Pem  15) 06/29/2020: Cycle 5 Day 1 maintenance Pem/Pem  16) 07/20/2020: Cycle 6 Day 1 maintenance Pem/Pem  17) 08/12/2020: Cycle 7 Day 1 maintenance Pem/Pem  18) 09/01/2020: Cycle 8 Day 1 maintenance Pem/Pem  19) 09/21/2020: Cycle 9 Day 1 maintenance Pem/Pem  20) 10/13/2020: Cycle 10 Day 1 maintenance Pem/Pem  21) 11/03/2020: Cycle 11 Day 1 maintenance Pem/Pem  22) 11/25/2020:  Cycle 12 Day 1 maintenance Pem/Pem 23) 12/15/2020:  Cycle 13 Day 1 maintenance Pem/Pem 24) 01/05/2021:  Cycle 14 Day 1 maintenance Pem/Pem 25) 01/26/2021:  Cycle 15 Day 1 maintenance Pem/Pem plus Becacizumab.  02/17/2021: Cycle 16 Day 1 maintenance Pem/Pem plus Becacizumab.  03/10/2021: Cycle 17 Day 1 maintenance Pem/Pem plus Becacizumab.  03/31/2021: Cycle 18 Day 1 maintenance Pem/Pem  04/20/2021:  Cycle 19 Day 1 maintenance Pem/Pem  05/12/2021: Cycle 20 Day 1 maintenance Pem/Pem 06/01/2021: Cycle 21 Day 1 maintenance Pem/Pem 06/22/2021: Cycle 22 Day 1 maintenance Pem/Pem  Interval History:  Virginia OUTEN89y.o. female with medical history significant for metastatic adenocarcinoma of the lung presents for a follow up visit. The patient's last visit was on 06/01/2021. In the interim since the last visit, she has continued pem/pem maintenance. Today is Cycle 22 Day 1.   On exam today Virginia [name] is accompanied by her husband.  She reports she has been doing "really good" in the interim since her last visit.  She notes she is doing her best to get  off of steroids and had her last 1 mg dose on Thursday of last week.  She notes that she does continue to have some nausea but no vomiting or diarrhea.  Her dizziness is improved but she is still somewhat  unsteady on her feet and tries to walk within grasping distance of an object to hold onto.  She notes that her back has been hurting but she has been doing breathing exercises to try to help with this.  She notes her bowels are moving well with daily MiraLAX.  She is not having any issues with bleeding or bruising.  She denies any fevers, chills, shortness of breath, chest pain, cough, neuropathy or skin changes. She has no other complaints.  A full 10 point ROS is listed below.   MEDICAL HISTORY:  Past Medical History:  Diagnosis Date   Anemia    Anxiety    Concussion 09/28/2019   DVT (deep venous thrombosis) ([place]) 2021   L leg   Dyspnea    GERD (gastroesophageal reflux disease)    Hypercholesterolemia    per pt, she does not have elevated lipids   Hypertension    met lung ca dx'd 09/2019   mets to spine, hip and brain   PONV (postoperative nausea and vomiting)    Tobacco abuse     SURGICAL HISTORY: Past Surgical History:  Procedure Laterality Date   ABDOMINAL HYSTERECTOMY     partial/ left ovaries   APPLICATION OF CRANIAL NAVIGATION N/A 12/02/2020   Procedure: APPLICATION OF CRANIAL NAVIGATION;  Surgeon: Erline Levine, MD;  Location: [facility];  Service: Neurosurgery;  Laterality: N/A;   CHOLECYSTECTOMY     CRANIOTOMY N/A 12/02/2020   Procedure: Posterior fossa craniotomy for tumor resection with brainlab;  Surgeon: Erline Levine, MD;  Location: [facility];  Service: Neurosurgery;  Laterality: N/A;   DILATION AND CURETTAGE OF UTERUS     IR IMAGING GUIDED PORT INSERTION  10/23/2019   KYPHOPLASTY N/A 03/15/2020   Procedure: Thoracic Eight KYPHOPLASTY;  Surgeon: Erline Levine, MD;  Location: [facility];  Service: Neurosurgery;  Laterality: N/A;  prone    LIPOMA EXCISION  2018   removed under left breast and right thigh.   TUBAL LIGATION      ALLERGIES:  has No Known Allergies.  MEDICATIONS:  Current Outpatient Medications  Medication Sig Dispense Refill   acetaminophen (TYLENOL) 500 MG  tablet Take 1,000 mg by mouth every 8 (eight) hours as needed for moderate pain.     albuterol (VENTOLIN HFA) 108 (90 Base) MCG/ACT inhaler INHALE 2 PUFFS INTO THE LUNGS EVERY 6 HOURS AS NEEDED FOR WHEEZING OR SHORTNESS OF BREATH (Patient taking differently: Inhale 2 puffs into the lungs every 6 (six) hours as needed for shortness of breath or wheezing.) 6.7 g 11   ALPRAZolam (XANAX) 0.25 MG tablet Take 1 tablet (0.25 mg total) by mouth 2 (two) times daily as needed for anxiety 60 tablet 5   amLODipine (NORVASC) 10 MG tablet Take 1 tablet (10 mg total) by mouth daily. 30 tablet 3   Cyanocobalamin (VITAMIN B12 PO) Take 2 tablets by mouth daily. Gummies     cyclobenzaprine (FLEXERIL) 5 MG tablet Take 5 mg by mouth at bedtime.     dexamethasone (DECADRON) 1 MG tablet Take 2 tablets (2 mg total) by mouth daily. 60 tablet 1   folic acid (FOLVITE) 1 MG tablet Take 1 tablet by mouth daily. 90 tablet 1   furosemide (LASIX) 20 MG tablet Take 1 tablet (20  mg total) by mouth daily. 30 tablet 0   hydrocortisone (CORTEF) 10 MG tablet Take 2 tablets (20 mg total) by mouth daily. (Patient not taking: Reported on 05/30/2021) 90 tablet 1   lidocaine-prilocaine (EMLA) cream Apply to port as needed. 30 g 0   Multiple Vitamins-Minerals (AIRBORNE PO) Take 1 tablet by mouth daily.      ofloxacin (OCUFLOX) 0.3 % ophthalmic solution 2 drops each eye 4 times a day for 5 days (Patient taking differently: Place 2 drops into both eyes daily as needed (irritation).) 5 mL 1   OLANZapine (ZYPREXA) 2.5 MG tablet TAKE 1 TABLET BY MOUTH EVERY NIGHT AT BEDTIME 90 tablet 1   ondansetron (ZOFRAN) 4 MG tablet TAKE 1 TO 2 TABLETS BY MOUTH 2 TIMES DAILY AS NEEDED 60 tablet 0   oxyCODONE (OXY IR/ROXICODONE) 5 MG immediate release tablet TAKE 1 TO 2 TABLETS BY MOUTH EVERY 4 HOURS AS NEEDED FOR SEVERE PAIN 90 tablet 0   pantoprazole (PROTONIX) 40 MG tablet TAKE ONE TABLET BY MOUTH DAILY (Patient taking differently: Take 40 mg by mouth daily.)  90 tablet 3   pentoxifylline (TRENTAL) 400 MG CR tablet TAKE 1 TABLET BY MOUTH WITH FOOD FOR 1 WEEK THEN TAKE 1 TABLET BY MOUTH 2 TIMES DAILY THEREAFTER 60 tablet 2   polyethylene glycol (MIRALAX / GLYCOLAX) 17 g packet Take 17 g by mouth 2 (two) times daily.      prochlorperazine (COMPAZINE) 10 MG tablet Take 10 mg by mouth every 6 (six) hours as needed for nausea or vomiting.     promethazine (PHENERGAN) 12.5 MG tablet TAKE 1 TO 2 TABLETS BY MOUTH EVERY 4 HOURS AS NEEDED FOR REFRACTORY NAUSEA OR VOMITING 30 tablet 0   promethazine (PHENERGAN) 12.5 MG tablet Take 1 to 2 tablets by mouth every 6 hours as needed for nausea 20 tablet 0   Vitamin D3 (VITAMIN D) 25 MCG tablet Take 2,000 Units by mouth daily.     vitamin E 180 MG (400 UNITS) capsule Take 400 IU daily x 1 week, then 400 IU BID (Patient taking differently: Take 400 Units by mouth in the morning and at bedtime. Take 400 IU daily x 1 week, then 400 IU BID) 60 capsule 4   XTAMPZA ER 13.5 MG C12A Take 1 capsule by mouth in the morning and at bedtime. (every 12 hours) 60 capsule 0   No current facility-administered medications for this visit.   Facility-Administered Medications Ordered in Other Visits  Medication Dose Route Frequency Provider Last Rate Last Admin   sodium chloride flush (NS) 0.9 % injection 10 mL  10 mL Intracatheter PRN Orson Slick, MD   10 mL at 06/22/21 1500    REVIEW OF SYSTEMS:   Constitutional: ( - ) fevers, ( - )  chills , ( - ) night sweats Eyes: ( - ) blurriness of vision, ( - ) double vision, ( - ) watery eyes Ears, nose, mouth, throat, and face: ( - ) mucositis, ( - ) sore throat Respiratory: ( - ) cough, ( - ) dyspnea, ( - ) wheezes Cardiovascular: ( - ) palpitation, ( - ) chest discomfort, ( - ) lower extremity swelling Gastrointestinal:  ( - ) nausea, ( - ) heartburn, ( - ) change in bowel habits Skin: ( - ) abnormal skin rashes Lymphatics: ( - ) new lymphadenopathy, ( - ) easy  bruising Neurological: ( - ) numbness, ( - ) tingling, ( - ) new weaknesses Behavioral/Psych: ( - )  mood change, ( - ) new changes  All other systems were reviewed with the patient and are negative.  PHYSICAL EXAMINATION: ECOG PERFORMANCE STATUS: 1 - Symptomatic but completely ambulatory  Vitals:   06/22/21 1111  BP: (!) 135/91  Pulse: (!) 105  Resp: 18  Temp: 98.2 F (36.8 C)  SpO2: 97%   Filed Weights   06/22/21 1111  Weight: 159 lb 12.8 oz (72.5 kg)    GENERAL: well appearing middle aged Caucasian female in NAD  SKIN: skin color, texture, turgor are normal, no rashes or significant lesions EYES: conjunctiva are pink and non-injected, sclera clear LUNGS: wheezing at lung bases. clear to auscultation and percussion with normal breathing effort HEART: regular rate & rhythm and no murmurs and bilateral + trace pitting lower extremity edema Musculoskeletal: no cyanosis of digits and no clubbing PSYCH: alert & oriented x 3, fluent speech NEURO: no focal motor/sensory deficits    LABORATORY DATA:  I have reviewed the data as listed CBC Latest Ref Rng & Units 06/22/2021 06/01/2021 05/12/2021  WBC 4.0 - 10.5 K/uL 5.7 8.6 9.9  Hemoglobin 12.0 - 15.0 g/dL 12.0 13.0 13.4  Hematocrit 36.0 - 46.0 % 33.6(L) 37.8 38.8  Platelets 150 - 400 K/uL 251 291 277    CMP Latest Ref Rng & Units 06/22/2021 06/01/2021 05/12/2021  Glucose 70 - 99 mg/dL 123(H) 117(H) 112(H)  BUN 8 - 23 mg/dL _0 Creatinine 0.44 - 1.00 mg/dL 0.56 0.71 0.72  Sodium 135 - 145 mmol/L 138 140 141  Potassium 3.5 - 5.1 mmol/L 3.0(L) 3.4(L) 3.5  Chloride 98 - 111 mmol/L 102 101 104  CO2 22 - 32 mmol/L _1 Calcium 8.9 - 10.3 mg/dL 8.7(L) 8.8(L) 9.1  Total Protein 6.5 - 8.1 g/dL 6.5 6.9 7.0  Total Bilirubin 0.3 - 1.2 mg/dL 0.7 0.5 0.4  Alkaline Phos 38 - 126 U/L 62 60 62  AST 15 - 41 U/L 21 14(L) 17  ALT 0 - 44 U/L _2 RADIOGRAPHIC STUDIES: I personally have viewed the radiographic studies  below: MR BRAIN W WO CONTRAST  Result Date: 05/26/2021 CLINICAL DATA:  Brain/CNS neoplasm, assess treatment response EXAM: MRI HEAD WITHOUT AND WITH CONTRAST TECHNIQUE: Multiplanar, multiecho pulse sequences of the brain and surrounding structures were obtained without and with intravenous contrast. CONTRAST:  46m MULTIHANCE GADOBENATE DIMEGLUMINE 529 MG/ML IV SOLN COMPARISON:  03/17/2021. FINDINGS: Brain: Sequela of left suboccipital craniotomy and central left cerebellar resection with continued decreased size of the T2 hyperintense resection cavity anteriorly surrounding. FLAIR hyperintensity is not substantially changed. Restricted diffusion anteriorly in the region of the anterior left cerebellum and left middle cerebellar peduncles not substantially changed (series 3, images 51 through 53). No new areas of restricted diffusion. Other heterogeneous areas of enhancement in the cerebellar vermis and right cerebellar hemisphere appears similar to the prior. Slightly increased nonenhancing T2/FLAIR hyperintensity in the ventrolateral right medulla (inferior olivary nucleus) Small focus of enhancement in the right occipital lobe is similar (series 11, image 62). Subtle leptomeningeal enhancement in the right parietal lobe is similar (series 11, image 94). Small nodular focus of enhancement in the anterior left frontal lobe is similar (series 11, image 114). Small focus of nodular enhancement in the left parietal lobe near the motor strip is similar (series 11, image 121). Stable mild nonspecific dural enhancement/thickening. No new enhancing lesions identified. Widespread patchy and confluent T2/FLAIR hyperintensity in the supratentorial white matter is similar. No midline  shift, hydrocephalus, acute hemorrhage, extra-axial fluid collection or acute infarct. Unchanged pituitary appearance/enhancement. Vascular: Major arterial flow voids are maintained at the skull base. Skull and upper cervical spine: Small  focus of enhancement in the posterior right calvarium appears unchanged dating back to 2021. Otherwise, normal marrow signal. Sinuses/Orbits: Mild scattered paranasal sinus mucosal thickening. Unremarkable orbits. Other: Small left mastoid effusion. IMPRESSION: 1. No substantial change in restricted diffusion in the left cerebellum/middle cerebellar peduncle and scattered areas of enhancement in the posterior fossa. Findings could represent treatment change/radiation necrosis and/or residual tumor. 2. Additional smaller supratentorial enhancing lesions are stable. Stable mild nonspecific dural enhancement/thickening. 3. Slightly increased nonenhancing T2/FLAIR hyperintensity in the ventrolateral right medulla (inferior olivary nucleus), which could represent hypertrophic olivary degeneration given abnormality in the contralateral left dentate nucleus, but warrants continued follow-up to exclude nonenhancing tumor. Electronically Signed   By: Margaretha [facility] M.D.   On: 05/26/2021 11:47     ASSESSMENT & PLAN Virginia [name] 65 y.o. female with medical history significant for metastatic adenocarcinoma of the lung presents for a follow up visit. 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   Previously we discussed Carboplatin/Pembrolizumab/Pemetrexed and the expected side effect from these medications.  We discussed the possible side effects of immunotherapy including colitis, hepatitis, dermatitis, and pneumonitis.  Additionally we discussed the side effects of carboplatin which include suppression of blood counts, nephrotoxicity, and nausea.  Additionally we discussed pemetrexed which can also have effects on counts and would require supplementation with vitamin J50 and folic acid.  She voiced her understanding of these side effects and treatment plans moving forward.  I also noted that we would be able to drop the chemotherapy agents that she had intolerance to continue  with pembrolizumab alone given her high TPS score.  The patient has a markedly elevated TPS score (90%)   Mrs. Hollars continues to tolerate the treatment. Her labs from today were reviewed without any intervention needed. Patient will proceed with treatment as planned and return to the clinic in 3 weeks prior to her next cycle.   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 Ended on 03/10/2021.  --patient will be followed every 3 weeks with labs and clinic visit. --port in place, patient completed chemotherapy education.  --plan for repeat CT C/A/P in Jan 2023 (q 3 months) --supportive therapy as listed below.  --RTC q 3 weeks for continued monitoring/chemotherapy treatments.    #Nausea/Vomiting, improving -- provided patient with Zofran 4-73m q8h PRN and compazine 158mq6H for breakthrough PRN.  She is currently using Phenergan for breakthrough. --continue olanzpaine 2.5 mg PO QHS to help with nausea. Marked improvement since the addition of this medication on 01/29/2020.  --continue to monitor   #Hypokalemia, stable --likely 2/2 to decreased PO intake, hypovolemia, and BP medications --Potassium level 3.0 today.   --continue to monitor  #Pain Control, stable --continue oxycodone 8m47m4H PRN. Can increase dose to 14m63mH if pain is severe. She is taking approximately 2-3 pills per day.  --continue Xtampza ER BID for long acting support.  --continue to take with Senokot to prevent opioid induced constipation. 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  --  nausea as above    #Brain Metastasis, stable --appreciate the assistance of Dr. 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 --radiation  therapy to the brain completed on 11/16/2019.  --MRI brain on 04/29/2020 showed evidence of small brain bleed. Eliquis therapy was discontinued.  --MRI on 11/04/2020 showed concern for progression of intracranial disease.  --On 12/02/2020, patient underwent craniotomy, resection of progressive cerebellar mass by Dr. Vertell Limber. 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  --MRI on 12/23/2020 showed evidence of radiation necrosis. Dr. Mickeal Skinner evaluated the patient on 01/02/2021 with recommendations to add IV Bevacizumab 10 mg/kg q 3 weeks starting today (01/26/2021). --she has started hydrocortisone per his recommendations.  Bevacizumab has been discontinued and is not planned for today's regimen.  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    # Hypertension: --BP at today's visit was 135/91. Continue Norvasc to 10 mg daily.  --continue to monitor at home and in clinic.    No orders of the defined types were placed in this encounter.  All questions were answered. The patient knows to call the clinic with any problems, questions or concerns.  A total of more than 30 minutes were spent on this encounter and over half of that time was spent on counseling and coordination of care as outlined above.   Ledell Peoples, MD Department of Hematology/Oncology [place] [facility] Phone: [phone] Pager: [phone] Email: [email or web address]   06/22/2021 4:01 PM   Literature Support:  Lorenza Chick, Rodrguez-Abreu D, Duffy Rhody, Felip E, De Angelis F, Domine M, Vinings, Hochmair MJ, Tennyson, [place], Bischoff HG, [place], Grossi F, Nambe, Reck M, [place], [place], [place], Rubio-Viqueira B, Novello S, Kurata T, Gray JE, Vida J, Wei Z, Yang J, Raftopoulos H, Haught[place], [place] [employer]; [id number] Investigators.  Pembrolizumab plus Chemotherapy in Metastatic Non-Small-Cell Lung Cancer. [place] Med. 2018 May 31;378(22):2078-2092.  --Median progression-free survival was 8.8 months (95% CI, 7.6 to 9.2) in the pembrolizumab-combination group and 4.9 months (95% CI, 4.7 to 5.5) in the placebo-combination group (hazard ratio for disease progression or death, 0.52; 95% CI, 0.43 to 0.64; P<0.001). Adverse events of grade 3 or higher occurred in 67.2% of the patients in the pembrolizumab-combination group and in 65.8% of those in the placebo-combination group.  Jodene Nam L. Efficacy of pemetrexed-based regimens in advanced non-small cell lung cancer patients with activating epidermal growth factor receptor mutations after tyrosine kinase inhibitor failure: a systematic review. Onco Targets Ther. 2018;11:2121-2129.   --The weighted median PFS, median OS, and ORR for patients treated with pem regimens were 5.09 months, 15.91 months, and 30.19%, respectively. Our systematic review results showed a favorable efficacy profile of pem regimens in NSCLC patients with EGFR mutation after EGFR-TKI failure.

## 2021-07-11 ENCOUNTER — Other Ambulatory Visit: Payer: Self-pay | Admitting: Hematology and Oncology

## 2021-07-11 ENCOUNTER — Other Ambulatory Visit (HOSPITAL_COMMUNITY): Payer: Self-pay

## 2021-07-12 ENCOUNTER — Other Ambulatory Visit (HOSPITAL_COMMUNITY): Payer: Self-pay

## 2021-07-12 ENCOUNTER — Encounter: Payer: Self-pay | Admitting: Hematology and Oncology

## 2021-07-12 ENCOUNTER — Telehealth: Payer: Self-pay

## 2021-07-12 MED ORDER — AMLODIPINE BESYLATE 10 MG PO TABS
10.0000 mg | ORAL_TABLET | Freq: Every day | ORAL | 3 refills | Status: DC
  Filled 2021-07-12: qty 30, 30d supply, fill #0
  Filled 2021-08-15: qty 30, 30d supply, fill #1
  Filled 2021-09-11: qty 30, 30d supply, fill #2

## 2021-07-12 MED ORDER — FUROSEMIDE 20 MG PO TABS
20.0000 mg | ORAL_TABLET | Freq: Every day | ORAL | 0 refills | Status: DC
  Filled 2021-07-12: qty 30, 30d supply, fill #0

## 2021-07-12 NOTE — Telephone Encounter (Signed)
LMOM for patient to call and schedule annual wellness phone visit  Schedule on a Wednesday  at 2, 3 or 4 but not 07/19/21

## 2021-07-12 NOTE — Telephone Encounter (Signed)
Scheduled CPE

## 2021-07-13 ENCOUNTER — Other Ambulatory Visit (HOSPITAL_COMMUNITY): Payer: Self-pay

## 2021-07-13 ENCOUNTER — Other Ambulatory Visit: Payer: Self-pay

## 2021-07-13 ENCOUNTER — Inpatient Hospital Stay: Payer: BC Managed Care – PPO | Attending: Hematology and Oncology

## 2021-07-13 ENCOUNTER — Inpatient Hospital Stay: Payer: BC Managed Care – PPO

## 2021-07-13 ENCOUNTER — Inpatient Hospital Stay (HOSPITAL_BASED_OUTPATIENT_CLINIC_OR_DEPARTMENT_OTHER): Payer: BC Managed Care – PPO | Admitting: Hematology and Oncology

## 2021-07-13 VITALS — BP 140/97 | HR 90 | Temp 98.0°F | Resp 18 | Wt 162.8 lb

## 2021-07-13 VITALS — Ht 62.0 in | Wt 162.0 lb

## 2021-07-13 DIAGNOSIS — Z86718 Personal history of other venous thrombosis and embolism: Secondary | ICD-10-CM | POA: Diagnosis not present

## 2021-07-13 DIAGNOSIS — Z95828 Presence of other vascular implants and grafts: Secondary | ICD-10-CM | POA: Diagnosis not present

## 2021-07-13 DIAGNOSIS — C7931 Secondary malignant neoplasm of brain: Secondary | ICD-10-CM

## 2021-07-13 DIAGNOSIS — Z5112 Encounter for antineoplastic immunotherapy: Secondary | ICD-10-CM | POA: Insufficient documentation

## 2021-07-13 DIAGNOSIS — Z923 Personal history of irradiation: Secondary | ICD-10-CM | POA: Insufficient documentation

## 2021-07-13 DIAGNOSIS — E876 Hypokalemia: Secondary | ICD-10-CM | POA: Insufficient documentation

## 2021-07-13 DIAGNOSIS — R918 Other nonspecific abnormal finding of lung field: Secondary | ICD-10-CM

## 2021-07-13 DIAGNOSIS — Z9049 Acquired absence of other specified parts of digestive tract: Secondary | ICD-10-CM | POA: Diagnosis not present

## 2021-07-13 DIAGNOSIS — I2510 Atherosclerotic heart disease of native coronary artery without angina pectoris: Secondary | ICD-10-CM | POA: Insufficient documentation

## 2021-07-13 DIAGNOSIS — I10 Essential (primary) hypertension: Secondary | ICD-10-CM | POA: Diagnosis not present

## 2021-07-13 DIAGNOSIS — C7951 Secondary malignant neoplasm of bone: Secondary | ICD-10-CM

## 2021-07-13 DIAGNOSIS — Z7901 Long term (current) use of anticoagulants: Secondary | ICD-10-CM | POA: Diagnosis not present

## 2021-07-13 DIAGNOSIS — C3490 Malignant neoplasm of unspecified part of unspecified bronchus or lung: Secondary | ICD-10-CM | POA: Diagnosis not present

## 2021-07-13 DIAGNOSIS — Z7952 Long term (current) use of systemic steroids: Secondary | ICD-10-CM | POA: Insufficient documentation

## 2021-07-13 DIAGNOSIS — Z5111 Encounter for antineoplastic chemotherapy: Secondary | ICD-10-CM | POA: Diagnosis not present

## 2021-07-13 DIAGNOSIS — R42 Dizziness and giddiness: Secondary | ICD-10-CM | POA: Insufficient documentation

## 2021-07-13 DIAGNOSIS — I700 Atherosclerosis of aorta: Secondary | ICD-10-CM | POA: Diagnosis not present

## 2021-07-13 DIAGNOSIS — C3431 Malignant neoplasm of lower lobe, right bronchus or lung: Secondary | ICD-10-CM | POA: Insufficient documentation

## 2021-07-13 LAB — CMP (CANCER CENTER ONLY)
ALT: 19 U/L (ref 0–44)
AST: 15 U/L (ref 15–41)
Albumin: 3.2 g/dL — ABNORMAL LOW (ref 3.5–5.0)
Alkaline Phosphatase: 62 U/L (ref 38–126)
Anion gap: 10 (ref 5–15)
BUN: 18 mg/dL (ref 8–23)
CO2: 28 mmol/L (ref 22–32)
Calcium: 8.8 mg/dL — ABNORMAL LOW (ref 8.9–10.3)
Chloride: 104 mmol/L (ref 98–111)
Creatinine: 0.7 mg/dL (ref 0.44–1.00)
GFR, Estimated: >60 mL/min (ref >60)
Glucose, Bld: 104 mg/dL — ABNORMAL HIGH (ref 70–99)
Potassium: 3.3 mmol/L — ABNORMAL LOW (ref 3.5–5.1)
Sodium: 142 mmol/L (ref 135–145)
Total Bilirubin: 0.4 mg/dL (ref 0.3–1.2)
Total Protein: 6.8 g/dL (ref 6.5–8.1)

## 2021-07-13 LAB — CBC WITH DIFFERENTIAL (CANCER CENTER ONLY)
Abs Immature Granulocytes: 0.08 10*3/uL — ABNORMAL HIGH (ref 0.00–0.07)
Basophils Absolute: 0 10*3/uL (ref 0.0–0.1)
Basophils Relative: 0 %
Eosinophils Absolute: 0.1 10*3/uL (ref 0.0–0.5)
Eosinophils Relative: 1 %
HCT: 36.8 % (ref 36.0–46.0)
Hemoglobin: 12.7 g/dL (ref 12.0–15.0)
Immature Granulocytes: 1 %
Lymphocytes Relative: 20 %
Lymphs Abs: 1.6 10*3/uL (ref 0.7–4.0)
MCH: 34.4 pg — ABNORMAL HIGH (ref 26.0–34.0)
MCHC: 34.5 g/dL (ref 30.0–36.0)
MCV: 99.7 fL (ref 80.0–100.0)
Monocytes Absolute: 1.1 10*3/uL — ABNORMAL HIGH (ref 0.1–1.0)
Monocytes Relative: 13 %
Neutro Abs: 5.4 10*3/uL (ref 1.7–7.7)
Neutrophils Relative %: 65 %
Platelet Count: 343 10*3/uL (ref 150–400)
RBC: 3.69 MIL/uL — ABNORMAL LOW (ref 3.87–5.11)
RDW: 14.3 % (ref 11.5–15.5)
WBC Count: 8.3 10*3/uL (ref 4.0–10.5)
nRBC: 0 % (ref 0.0–0.2)

## 2021-07-13 LAB — LACTATE DEHYDROGENASE: LDH: 215 U/L — ABNORMAL HIGH (ref 98–192)

## 2021-07-13 MED ORDER — PROCHLORPERAZINE MALEATE 10 MG PO TABS
10.0000 mg | ORAL_TABLET | Freq: Once | ORAL | Status: AC
  Administered 2021-07-13: 10 mg via ORAL
  Filled 2021-07-13: qty 1

## 2021-07-13 MED ORDER — SODIUM CHLORIDE 0.9 % IV SOLN
Freq: Once | INTRAVENOUS | Status: AC
Start: 2021-07-13 — End: 2021-07-13

## 2021-07-13 MED ORDER — SODIUM CHLORIDE 0.9% FLUSH
10.0000 mL | INTRAVENOUS | Status: DC | PRN
  Administered 2021-07-13: 10 mL

## 2021-07-13 MED ORDER — HEPARIN SOD (PORK) LOCK FLUSH 100 UNIT/ML IV SOLN
500.0000 [IU] | Freq: Once | INTRAVENOUS | Status: AC | PRN
  Administered 2021-07-13: 500 [IU]

## 2021-07-13 MED ORDER — SODIUM CHLORIDE 0.9 % IV SOLN
200.0000 mg | Freq: Once | INTRAVENOUS | Status: AC
  Administered 2021-07-13: 200 mg via INTRAVENOUS
  Filled 2021-07-13: qty 8

## 2021-07-13 MED ORDER — ZOLEDRONIC ACID 4 MG/100ML IV SOLN
4.0000 mg | Freq: Once | INTRAVENOUS | Status: AC
  Administered 2021-07-13: 4 mg via INTRAVENOUS
  Filled 2021-07-13: qty 100

## 2021-07-13 MED ORDER — SODIUM CHLORIDE 0.9% FLUSH
10.0000 mL | INTRAVENOUS | Status: AC | PRN
  Administered 2021-07-13: 10 mL

## 2021-07-13 MED ORDER — SODIUM CHLORIDE 0.9 % IV SOLN
500.0000 mg/m2 | Freq: Once | INTRAVENOUS | Status: AC
  Administered 2021-07-13: 900 mg via INTRAVENOUS
  Filled 2021-07-13: qty 20

## 2021-07-13 NOTE — Patient Instructions (Signed)
[facility]COL[facility]   Discharge Instructions: Thank you for choosing [facility] to provide your oncology and hematology care.   If you have a lab appointment with the [facility], please go directly to the [facility] and check in at the registration area.   Wear comfortable clothing and clothing appropriate for easy access to any Portacath or PICC line.   We strive to give you quality time with your provider. You may need to reschedule your appointment if you arrive late (15 or more minutes).  Arriving late affects you and other patients whose appointments are after yours.  Also, if you miss three or more appointments without notifying the office, you may be dismissed from the clinic at the provider's discretion.      For prescription refill requests, have your pharmacy contact our office and allow 72 hours for refills to be completed.    Today you received the following chemotherapy and/or immunotherapy agents: pembrolizumab and pemetrexed.      To help prevent nausea and vomiting after your treatment, we encourage you to take your nausea medication as directed.  BELOW ARE SYMPTOMS THAT SHOULD BE REPORTED IMMEDIATELY: *FEVER GREATER THAN 100.4 F (38 C) OR HIGHER *CHILLS OR SWEATING *NAUSEA AND VOMITING THAT IS NOT CONTROLLED WITH YOUR NAUSEA MEDICATION *UNUSUAL SHORTNESS OF BREATH *UNUSUAL BRUISING OR BLEEDING *URINARY PROBLEMS (pain or burning when urinating, or frequent urination) *BOWEL PROBLEMS (unusual diarrhea, constipation, pain near the anus) TENDERNESS IN MOUTH AND THROAT WITH OR WITHOUT PRESENCE OF ULCERS (sore throat, sores in mouth, or a toothache) UNUSUAL RASH, SWELLING OR PAIN  UNUSUAL VAGINAL DISCHARGE OR ITCHING   Items with * indicate a potential emergency and should be followed up as soon as possible or go to the Emergency Department if any problems should occur.  Please show the CHEMOTHERAPY ALERT CARD or IMMUNOTHERAPY  ALERT CARD at check-in to the Emergency Department and triage nurse.  Should you have questions after your visit or need to cancel or reschedule your appointment, please contact [facility]  Dept: [phone]  and follow the prompts.  Office hours are 8:00 a.m. to 4:30 p.m. Monday - Friday. Please note that voicemails left after 4:00 p.m. may not be returned until the following business day.  We are closed weekends and major holidays. You have access to a nurse at all times for urgent questions. Please call the main number to the clinic Dept: [phone] and follow the prompts.   For any non-urgent questions, you may also contact your provider using MyChart. We now offer e-Visits for anyone 48 and older to request care online for non-urgent symptoms. For details visit [email or web address].   Also download the MyChart app! Go to the app store, search "MyChart", open the app, select [facility], and log in with your MyChart username and password.  Due to Covid, a mask is required upon entering the hospital/clinic. If you do not have a mask, one will be given to you upon arrival. For doctor visits, patients may have 1 support person aged 16 or older with them. For treatment visits, patients cannot have anyone with them due to current Covid guidelines and our immunocompromised population.

## 2021-07-14 ENCOUNTER — Telehealth: Payer: Self-pay | Admitting: *Deleted

## 2021-07-14 ENCOUNTER — Other Ambulatory Visit (HOSPITAL_COMMUNITY): Payer: Self-pay

## 2021-07-14 MED ORDER — POTASSIUM CHLORIDE CRYS ER 20 MEQ PO TBCR
20.0000 meq | EXTENDED_RELEASE_TABLET | Freq: Every day | ORAL | 2 refills | Status: DC
  Filled 2021-07-14: qty 30, 30d supply, fill #0

## 2021-07-14 NOTE — Telephone Encounter (Signed)
Received call from pt's husband [name] requesting prescription for KCL 20 meq. It was to be sent in yesterday . Advised that I would send it in now to [facility]. [name] states he understands that she will take Lasix BID until her leg swelling decreases. He also asked tf it is ok for her to walk more. Advised that she should be up and about as much as she can and keep pushing herself to do a bit more every day. They are planning a trip to [place] in 1.5 weeks and advised she will do better there, if she increases her activity now. [name] agreed and will encourage her to do more.

## 2021-07-16 ENCOUNTER — Encounter: Payer: Self-pay | Admitting: Hematology and Oncology

## 2021-07-16 NOTE — Progress Notes (Signed)
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 CT of the head showed showed concerning for metastatic disease involving the left cerebellum and right occipital lobe.  2) 09/30/2019: MRI brain confirms mass within the left cerebellum measures 1.6 x 1.3 x 1.5 cm as well as at least 8 metastatic lesions within the supratentorial and infratentorial brain as outlined 3) 10/01/2019: PET CT scan revealed hypermetabolic infrahilar right lower lobe mass with hypermetabolic nodal metastases in the right hilum, mediastinum, right supraclavicular region, left axilla and lower left neck. 4) 10/08/2019: US guided biopsy of the lymph nodes confirms poorly differentiated non-small cell carcinoma. Immunohistochemistry confirms adenocarcinoma of lung primary. PD-L1 and NGS later revealed a MET Exon 14 mutation and TPS of 90%.  5) 10/12/2019: Establish care with Dr. Lorenso Courier  6) 11/04/2019: Day 1 of capmatinib $RemoveBefor'400mg'hQuBQmPqLVLf$  BID 7) 11/09/2019: presented to Rad/Onc with a DVT in LLE. Seen in symptom management clinic and started on apixaban.  8) 12/29/2019: CT C/A/P showed interval decrease in size of mass involving the superior segment of right lower lobe and reduction in mediastinal and left supraclavicular adenopathy though some small spinal lesions were noted in the interim between the two sets of scans 9) 01/25/2020: temporarily stopped capmatinib due to symptoms of nausea/fatigue. Restarted therapy on 01/30/2020 after brief chemo holiday.  10) 03/22/2020: CT C/A/P reveals a mixed picture, with new lung nodule and  increased lymphadenopathy, however response in bone lesions. D/c capmatinib therapy 11) 04/08/2020: start of Carbo/Pem/Pem for 2nd line treatment. Cycle 1 Day 1   12) 04/29/2020: Cycle 2 Day 1 Carbo/Pem/Pem  13) 05/20/2020: Cycle 3 Day 1 Carbo/Pem/Pem  14) 06/09/2020: Cycle 4 Day 1 Carbo/Pem/Pem  15) 06/29/2020: Cycle 5 Day 1 maintenance Pem/Pem  16) 07/20/2020: Cycle 6 Day 1 maintenance Pem/Pem  17) 08/12/2020: Cycle 7 Day 1 maintenance Pem/Pem  18) 09/01/2020: Cycle 8 Day 1 maintenance Pem/Pem  19) 09/21/2020: Cycle 9 Day 1 maintenance Pem/Pem  20) 10/13/2020: Cycle 10 Day 1 maintenance Pem/Pem  21) 11/03/2020: Cycle 11 Day 1 maintenance Pem/Pem  22) 11/25/2020:  Cycle 12 Day 1 maintenance Pem/Pem 23) 12/15/2020:  Cycle 13 Day 1 maintenance Pem/Pem 24) 01/05/2021:  Cycle 14 Day 1 maintenance Pem/Pem 25) 01/26/2021:  Cycle 15 Day 1 maintenance Pem/Pem plus Becacizumab.  02/17/2021: Cycle 16 Day 1 maintenance Pem/Pem plus Becacizumab.  03/10/2021: Cycle 17 Day 1 maintenance Pem/Pem plus Becacizumab.  03/31/2021: Cycle 18 Day 1 maintenance Pem/Pem  04/20/2021:  Cycle 19 Day 1 maintenance Pem/Pem  05/12/2021: Cycle 20 Day 1 maintenance Pem/Pem 06/01/2021: Cycle 21 Day 1 maintenance Pem/Pem 06/22/2021: Cycle 22 Day 1 maintenance Pem/Pem 07/13/2021: Cycle 23 Day 1 maintenance Pem/Pem  Interval History:  Virginia [name] 65 y.o. female with medical history significant for metastatic adenocarcinoma of the lung presents for a follow up visit. The patient's last visit was on 06/22/2021. In the interim since the last visit, she has continued pem/pem maintenance. Today is Cycle 23 Day 1.   On exam today Virginia [name] is accompanied by her husband.  She reports she has been doing well overall and here since her last visit.  She tried to  wean herself down to steroids every other day but was having difficulty with this and therefore restarted daily dosing.  She is having more swelling in her lower extremities and occasional  episodes of dizziness.  She notes that her pain has been stable and predominantly located in the back.  Otherwise she is tolerating chemotherapy quite well with no other clear side effects.  She is not having any issues with bleeding or bruising.  She denies any fevers, chills, shortness of breath, chest pain, cough, neuropathy or skin changes. She has no other complaints.  A full 10 point ROS is listed below.   MEDICAL HISTORY:  Past Medical History:  Diagnosis Date   Anemia    Anxiety    Concussion 09/28/2019   DVT (deep venous thrombosis) ([place]) 2021   L leg   Dyspnea    GERD (gastroesophageal reflux disease)    Hypercholesterolemia    per pt, she does not have elevated lipids   Hypertension    met lung ca dx'd 09/2019   mets to spine, hip and brain   PONV (postoperative nausea and vomiting)    Tobacco abuse     SURGICAL HISTORY: Past Surgical History:  Procedure Laterality Date   ABDOMINAL HYSTERECTOMY     partial/ left ovaries   APPLICATION OF CRANIAL NAVIGATION N/A 12/02/2020   Procedure: APPLICATION OF CRANIAL NAVIGATION;  Surgeon: Erline Levine, MD;  Location: [place];  Service: Neurosurgery;  Laterality: N/A;   CHOLECYSTECTOMY     CRANIOTOMY N/A 12/02/2020   Procedure: Posterior fossa craniotomy for tumor resection with brainlab;  Surgeon: Erline Levine, MD;  Location: [place];  Service: Neurosurgery;  Laterality: N/A;   DILATION AND CURETTAGE OF UTERUS     IR IMAGING GUIDED PORT INSERTION  10/23/2019   KYPHOPLASTY N/A 03/15/2020   Procedure: Thoracic Eight KYPHOPLASTY;  Surgeon: Erline Levine, MD;  Location: [place];  Service: Neurosurgery;  Laterality: N/A;  prone    LIPOMA EXCISION  2018   removed under left breast and right thigh.   TUBAL LIGATION      ALLERGIES:  has No Known Allergies.  MEDICATIONS:  Current Outpatient Medications  Medication Sig Dispense Refill   acetaminophen (TYLENOL) 500 MG tablet Take 1,000 mg by mouth every 8 (eight) hours as needed for moderate  pain.     albuterol (VENTOLIN HFA) 108 (90 Base) MCG/ACT inhaler INHALE 2 PUFFS INTO THE LUNGS EVERY 6 HOURS AS NEEDED FOR WHEEZING OR SHORTNESS OF BREATH (Patient taking differently: Inhale 2 puffs into the lungs every 6 (six) hours as needed for shortness of breath or wheezing.) 6.7 g 11   ALPRAZolam (XANAX) 0.25 MG tablet Take 1 tablet (0.25 mg total) by mouth 2 (two) times daily as needed for anxiety 60 tablet 5   amLODipine (NORVASC) 10 MG tablet Take 1 tablet (10 mg total) by mouth daily. 30 tablet 3   Cyanocobalamin (VITAMIN B12 PO) Take 2 tablets by mouth daily. Gummies     cyclobenzaprine (FLEXERIL) 5 MG tablet Take 5 mg by mouth at bedtime.     dexamethasone (DECADRON) 1 MG tablet Take 2 tablets (2 mg total) by mouth daily. 60 tablet 1   folic acid (FOLVITE) 1 MG tablet Take 1 tablet by mouth daily. 90 tablet 1   furosemide (LASIX) 20 MG tablet Take 1 tablet (20 mg total) by mouth daily. 30 tablet 0   hydrocortisone (CORTEF) 10 MG tablet Take 2 tablets (20 mg total) by mouth daily. (Patient not taking: Reported  on 05/30/2021) 90 tablet 1   lidocaine-prilocaine (EMLA) cream Apply to port as needed. 30 g 0   Multiple Vitamins-Minerals (AIRBORNE PO) Take 1 tablet by mouth daily.      ofloxacin (OCUFLOX) 0.3 % ophthalmic solution 2 drops each eye 4 times a day for 5 days (Patient taking differently: Place 2 drops into both eyes daily as needed (irritation).) 5 mL 1   OLANZapine (ZYPREXA) 2.5 MG tablet TAKE 1 TABLET BY MOUTH EVERY NIGHT AT BEDTIME 90 tablet 1   ondansetron (ZOFRAN) 4 MG tablet TAKE 1 TO 2 TABLETS BY MOUTH 2 TIMES DAILY AS NEEDED 60 tablet 0   oxyCODONE (OXY IR/ROXICODONE) 5 MG immediate release tablet TAKE 1 TO 2 TABLETS BY MOUTH EVERY 4 HOURS AS NEEDED FOR SEVERE PAIN 90 tablet 0   pantoprazole (PROTONIX) 40 MG tablet TAKE ONE TABLET BY MOUTH DAILY (Patient taking differently: Take 40 mg by mouth daily.) 90 tablet 3   pentoxifylline (TRENTAL) 400 MG CR tablet TAKE 1 TABLET BY  MOUTH WITH FOOD FOR 1 WEEK THEN TAKE 1 TABLET BY MOUTH 2 TIMES DAILY THEREAFTER 60 tablet 2   polyethylene glycol (MIRALAX / GLYCOLAX) 17 g packet Take 17 g by mouth 2 (two) times daily.      potassium chloride SA (KLOR-CON) 20 MEQ tablet Take 1 tablet by mouth daily. 30 tablet 2   prochlorperazine (COMPAZINE) 10 MG tablet Take 10 mg by mouth every 6 (six) hours as needed for nausea or vomiting.     promethazine (PHENERGAN) 12.5 MG tablet Take 1 to 2 tablets by mouth every 6 hours as needed for nausea 20 tablet 0   Vitamin D3 (VITAMIN D) 25 MCG tablet Take 2,000 Units by mouth daily.     vitamin E 180 MG (400 UNITS) capsule Take 400 IU daily x 1 week, then 400 IU BID (Patient taking differently: Take 400 Units by mouth in the morning and at bedtime. Take 400 IU daily x 1 week, then 400 IU BID) 60 capsule 4   XTAMPZA ER 13.5 MG C12A Take 1 capsule by mouth in the morning and at bedtime. (every 12 hours) 60 capsule 0   No current facility-administered medications for this visit.    REVIEW OF SYSTEMS:   Constitutional: ( - ) fevers, ( - )  chills , ( - ) night sweats Eyes: ( - ) blurriness of vision, ( - ) double vision, ( - ) watery eyes Ears, nose, mouth, throat, and face: ( - ) mucositis, ( - ) sore throat Respiratory: ( - ) cough, ( - ) dyspnea, ( - ) wheezes Cardiovascular: ( - ) palpitation, ( - ) chest discomfort, ( - ) lower extremity swelling Gastrointestinal:  ( - ) nausea, ( - ) heartburn, ( - ) change in bowel habits Skin: ( - ) abnormal skin rashes Lymphatics: ( - ) new lymphadenopathy, ( - ) easy bruising Neurological: ( - ) numbness, ( - ) tingling, ( - ) new weaknesses Behavioral/Psych: ( - ) mood change, ( - ) new changes  All other systems were reviewed with the patient and are negative.  PHYSICAL EXAMINATION: ECOG PERFORMANCE STATUS: 1 - Symptomatic but completely ambulatory  Vitals:   07/13/21 1109  BP: (!) 140/97  Pulse: 90  Resp: 18  Temp: 98 F (36.7 C)  SpO2:  99%   Filed Weights   07/13/21 1109  Weight: 162 lb 12.8 oz (73.8 kg)    GENERAL: well appearing middle aged Caucasian  female in NAD  SKIN: skin color, texture, turgor are normal, no rashes or significant lesions EYES: conjunctiva are pink and non-injected, sclera clear LUNGS: wheezing at lung bases. clear to auscultation and percussion with normal breathing effort HEART: regular rate & rhythm and no murmurs and bilateral + trace pitting lower extremity edema Musculoskeletal: no cyanosis of digits and no clubbing PSYCH: alert & oriented x 3, fluent speech NEURO: no focal motor/sensory deficits    LABORATORY DATA:  I have reviewed the data as listed CBC Latest Ref Rng & Units 07/13/2021 06/22/2021 06/01/2021  WBC 4.0 - 10.5 K/uL 8.3 5.7 8.6  Hemoglobin 12.0 - 15.0 g/dL 12.7 12.0 13.0  Hematocrit 36.0 - 46.0 % 36.8 33.6(L) 37.8  Platelets 150 - 400 K/uL 343 251 291    CMP Latest Ref Rng & Units 07/13/2021 06/22/2021 06/01/2021  Glucose 70 - 99 mg/dL 104(H) 123(H) 117(H)  BUN 8 - 23 mg/dL $Remove'18 9 20  'HllPhah$ Creatinine 0.44 - 1.00 mg/dL 0.70 0.56 0.71  Sodium 135 - 145 mmol/L 142 138 140  Potassium 3.5 - 5.1 mmol/L 3.3(L) 3.0(L) 3.4(L)  Chloride 98 - 111 mmol/L 104 102 101  CO2 22 - 32 mmol/L $RemoveB'28 28 29  'CcfYppvY$ Calcium 8.9 - 10.3 mg/dL 8.8(L) 8.7(L) 8.8(L)  Total Protein 6.5 - 8.1 g/dL 6.8 6.5 6.9  Total Bilirubin 0.3 - 1.2 mg/dL 0.4 0.7 0.5  Alkaline Phos 38 - 126 U/L 62 62 60  AST 15 - 41 U/L 15 21 14(L)  ALT 0 - 44 U/L $Remo'19 23 21     'apPdp$ RADIOGRAPHIC STUDIES: I personally have viewed the radiographic studies below: CT CHEST ABDOMEN PELVIS W CONTRAST  Result Date: 06/22/2021 CLINICAL DATA:  Metastatic squamous cell lung cancer, assess treatment response, ongoing chemotherapy EXAM: CT CHEST, ABDOMEN, AND PELVIS WITH CONTRAST TECHNIQUE: Multidetector CT imaging of the chest, abdomen and pelvis was performed following the standard protocol during bolus administration of intravenous contrast. CONTRAST:   49mL OMNIPAQUE IOHEXOL 350 MG/ML SOLN, additional oral enteric contrast COMPARISON:  03/17/2021 FINDINGS: CT CHEST FINDINGS Cardiovascular: Right chest port catheter. Aortic atherosclerosis. Normal heart size. Scattered left and right coronary artery calcifications. No pericardial effusion. Mediastinum/Nodes: Unchanged post treatment appearance of subcarinal and right hilar soft tissue. No discretely enlarged lymph nodes in the mediastinum. Thyroid gland, trachea, and esophagus demonstrate no significant findings. Lungs/Pleura: Redemonstrated post treatment/post radiation fibrosis and consolidation of the medial right lower lobe (series 7, image 76). Additional bandlike scarring of the bilateral lung bases. Numerous small solid and ground-glass nodules throughout the lungs are unchanged, most concentrated in the lung apices (series 7, image 37, 27 90). No pleural effusion or pneumothorax. Musculoskeletal: No chest wall mass. CT ABDOMEN PELVIS FINDINGS Hepatobiliary: No focal liver abnormality is seen. Status post cholecystectomy. Mild postoperative biliary dilatation. Pancreas: Unremarkable. No pancreatic ductal dilatation or surrounding inflammatory changes. Spleen: Normal in size without significant abnormality. Adrenals/Urinary Tract: Adrenal glands are unremarkable. Kidneys are normal, without renal calculi, solid lesion, or hydronephrosis. Bladder is unremarkable. Stomach/Bowel: Stomach is within normal limits. Appendix appears normal. No evidence of bowel wall thickening, distention, or inflammatory changes. Vascular/Lymphatic: Aortic atherosclerosis. No enlarged abdominal or pelvic lymph nodes. Reproductive: Status post hysterectomy. Other: No abdominal wall hernia or abnormality. No abdominopelvic ascites. Musculoskeletal: Status post vertebral cement augmentation of T8. unchanged sclerotic osseous lesions of the left and right ilia (series 2, image 91). IMPRESSION: 1. Redemonstrated post treatment/post  radiation fibrosis and consolidation of the medial right lower lobe. 2. Unchanged post treatment appearance  of subcarinal and right hilar soft tissue. No discretely enlarged lymph nodes in the mediastinum. 3. Numerous small solid and ground-glass nodules throughout the lungs are unchanged, most concentrated in the lung apices. 4. Unchanged sclerotic osseous metastatic lesions. Status post vertebral cement augmentation of T8. 5. No evidence of new metastatic disease in the chest, abdomen, or pelvis. 6. Coronary artery disease. Aortic Atherosclerosis (ICD10-I70.0). Electronically Signed   By: Delanna Ahmadi M.D.   On: 06/22/2021 16:46     ASSESSMENT & PLAN Virginia [name] 65 y.o. female with medical history significant for metastatic adenocarcinoma of the lung presents for a follow up visit. 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   Previously we discussed Carboplatin/Pembrolizumab/Pemetrexed and the expected side effect from these medications.  We discussed the possible side effects of immunotherapy including colitis, hepatitis, dermatitis, and pneumonitis.  Additionally we discussed the side effects of carboplatin which include suppression of blood counts, nephrotoxicity, and nausea.  Additionally we discussed pemetrexed which can also have effects on counts and would require supplementation with vitamin Q03 and folic acid.  She voiced her understanding of these side effects and treatment plans moving forward.  I also noted that we would be able to drop the chemotherapy agents that she had intolerance to continue with pembrolizumab alone given her high TPS score.  The patient has a markedly elevated TPS score (90%)   Virginia [name] continues to tolerate the treatment. Her labs from today were reviewed without any intervention needed. Patient will proceed with treatment as planned and return to the clinic in 3 weeks prior to her next cycle.   # Metastatic Adenocarcinoma  of the Lung with MET Exon 14 Mutation -- today will proceed with Cycle 23 Day 1 of Pem/Pem maintenance (Carboplatin dropped after Cycle 4)  --Bevacizumab was added, administered q 21 days starting on 01/26/2021. Ended on 03/10/2021.  --patient will be followed every 3 weeks with labs and clinic visit. --port in place, patient completed chemotherapy education.  --plan for repeat CT C/A/P in Jan 2023 (q 3 months) --supportive therapy as listed below.  --RTC q 3 weeks for continued monitoring/chemotherapy treatments.    #Nausea/Vomiting, improving -- provided patient with Zofran 4-8mg  q8h PRN and compazine 10mg  q6H for breakthrough PRN.  She is currently using Phenergan for breakthrough. --continue olanzpaine 2.5 mg PO QHS to help with nausea. Marked improvement since the addition of this medication on 01/29/2020.  --continue to monitor   #Hypokalemia, stable --likely 2/2 to decreased PO intake, hypovolemia, and BP medications --Potassium level 3.3 today.   --prescribed 73meq PO potassium daily.  --continue to monitor  #Pain Control, stable --continue oxycodone 5mg  q4H PRN. Can increase dose to 10mg  q6H if pain is severe. She is taking approximately 2-3 pills per day.  --continue Xtampza ER BID for long acting support.  --continue to take with Senokot to prevent opioid induced constipation. 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  --nausea as above    #Brain Metastasis, stable --appreciate the assistance of Dr. 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 --radiation therapy to the brain completed on 11/16/2019.  --MRI brain on 04/29/2020 showed evidence of small brain bleed. Eliquis therapy was discontinued.  --MRI on 11/04/2020 showed concern for progression of intracranial disease.  --On 12/02/2020, patient underwent craniotomy, resection of progressive cerebellar mass by Dr. Vertell [name].  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  --MRI on 12/23/2020 showed evidence of radiation necrosis. Dr. Mickeal Skinner evaluated the patient on 01/02/2021 with recommendations to add IV Bevacizumab 10 mg/kg q 3 weeks ( started 01/26/2021). Completed a full course of this therapy. --05/26/2021: MRI brain showed no substantial changes.   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    # Hypertension: --BP at today's visit was 140/97. Continue Norvasc to 10 mg daily.  --continue to monitor at home and in clinic.    No orders of the defined types were placed in this encounter.  All questions were answered. The patient knows to call the clinic with any problems, questions or concerns.  A total of more than 30 minutes were spent on this encounter and over half of that time was spent on counseling and coordination of care as outlined above.   Ledell Peoples, MD Department of Hematology/Oncology [place] [place] [facility] Phone: [phone] Pager: [phone] Email: [email or web address]   07/16/2021 1:21 PM   Literature Support:  Lorenza Chick, Rodrguez-Abreu D, Duffy Rhody, Felip E, [place] F, Domine M, Atkins, Hochmair MJ, [place], [place], Bischoff HG, [place], Grossi F, [place], Reck M, [employer], [employer], [place], Rubio-Viqueira B, Novello S, Kurata T, Gray JE, Vida J, Wei Z, Yang J, Raftopoulos H, [place], [place] [employer]; [id number] Investigators. Pembrolizumab plus Chemotherapy in Metastatic Non-Small-Cell Lung Cancer. [employer] Med. 2018 May 31;378(22):2078-2092.  --Median progression-free survival was 8.8 months (95% CI, 7.6 to 9.2) in the pembrolizumab-combination group and 4.9 months (95% CI, 4.7 to 5.5) in the placebo-combination group (hazard ratio for disease progression or death, 0.52; 95% CI, 0.43 to 0.64;  P<0.001). Adverse events of grade 3 or higher occurred in 67.2% of the patients in the pembrolizumab-combination group and in 65.8% of those in the placebo-combination group.  Jodene Nam L. Efficacy of pemetrexed-based regimens in advanced non-small cell lung cancer patients with activating epidermal growth factor receptor mutations after tyrosine kinase inhibitor failure: a systematic review. Onco Targets Ther. 2018;11:2121-2129.   --The weighted median PFS, median OS, and ORR for patients treated with pem regimens were 5.09 months, 15.91 months, and 30.19%, respectively. Our systematic review results showed a favorable efficacy profile of pem regimens in NSCLC patients with EGFR mutation after EGFR-TKI failure.

## 2021-07-20 ENCOUNTER — Telehealth: Payer: Self-pay | Admitting: *Deleted

## 2021-07-20 DIAGNOSIS — I6789 Other cerebrovascular disease: Secondary | ICD-10-CM | POA: Diagnosis not present

## 2021-07-20 NOTE — Telephone Encounter (Signed)
Received call from pt's husband.  He is requesting  refills on pt's Xtampza, oxycodone and a 90 day supply of her Lasix.

## 2021-07-21 ENCOUNTER — Other Ambulatory Visit (HOSPITAL_COMMUNITY): Payer: Self-pay

## 2021-07-21 ENCOUNTER — Other Ambulatory Visit: Payer: Self-pay | Admitting: Hematology and Oncology

## 2021-07-21 MED ORDER — FUROSEMIDE 20 MG PO TABS
20.0000 mg | ORAL_TABLET | Freq: Every day | ORAL | 1 refills | Status: DC
  Filled 2021-07-21: qty 90, 90d supply, fill #0
  Filled ????-??-??: fill #0

## 2021-07-21 MED ORDER — XTAMPZA ER 13.5 MG PO C12A
1.0000 | EXTENDED_RELEASE_CAPSULE | Freq: Two times a day (BID) | ORAL | 0 refills | Status: DC
  Filled 2021-07-21: qty 60, 30d supply, fill #0

## 2021-07-22 ENCOUNTER — Other Ambulatory Visit (HOSPITAL_COMMUNITY): Payer: Self-pay

## 2021-07-24 ENCOUNTER — Other Ambulatory Visit: Payer: Self-pay | Admitting: Hematology and Oncology

## 2021-07-24 ENCOUNTER — Telehealth: Payer: Self-pay

## 2021-07-24 ENCOUNTER — Other Ambulatory Visit (HOSPITAL_COMMUNITY): Payer: Self-pay

## 2021-07-24 ENCOUNTER — Encounter: Payer: Self-pay | Admitting: Hematology and Oncology

## 2021-07-24 MED ORDER — OLANZAPINE 2.5 MG PO TABS
ORAL_TABLET | Freq: Every day | ORAL | 1 refills | Status: DC
  Filled 2021-07-24 – 2021-08-05 (×2): qty 90, 90d supply, fill #0

## 2021-07-24 MED ORDER — FUROSEMIDE 20 MG PO TABS
20.0000 mg | ORAL_TABLET | Freq: Every day | ORAL | 1 refills | Status: DC
  Filled 2021-07-24 (×2): qty 90, 90d supply, fill #0
  Filled 2021-08-15: qty 90, 90d supply, fill #1

## 2021-07-24 MED ORDER — OLANZAPINE 2.5 MG PO TABS
ORAL_TABLET | Freq: Every day | ORAL | 1 refills | Status: DC
  Filled ????-??-??: fill #0

## 2021-07-24 MED ORDER — XTAMPZA ER 13.5 MG PO C12A
1.0000 | EXTENDED_RELEASE_CAPSULE | Freq: Two times a day (BID) | ORAL | 0 refills | Status: DC
Start: 2021-07-24 — End: 2021-08-25
  Filled 2021-07-24: qty 60, 30d supply, fill #0

## 2021-07-24 MED ORDER — OXYCODONE HCL 5 MG PO TABS
ORAL_TABLET | ORAL | 0 refills | Status: DC
  Filled 2021-07-24: qty 90, 8d supply, fill #0

## 2021-07-24 MED ORDER — FUROSEMIDE 20 MG PO TABS
20.0000 mg | ORAL_TABLET | Freq: Every day | ORAL | 0 refills | Status: DC
  Filled ????-??-??: fill #0

## 2021-07-24 NOTE — Telephone Encounter (Signed)
Called to notify Patient of Prior Authorization approval for [facility]. Spoke with Spouse. Medication is approved through 07/23/2022. Spouse requested Oxy IR 5mg  for pain and stated that Patient is taking Furosemide 20mg  twice daily instead of once daily. Notified Dr. Libby Maw Nurse of this information.

## 2021-08-05 ENCOUNTER — Other Ambulatory Visit (HOSPITAL_COMMUNITY): Payer: Self-pay

## 2021-08-10 ENCOUNTER — Ambulatory Visit: Payer: BC Managed Care – PPO

## 2021-08-10 ENCOUNTER — Other Ambulatory Visit: Payer: BC Managed Care – PPO

## 2021-08-11 ENCOUNTER — Other Ambulatory Visit: Payer: BC Managed Care – PPO

## 2021-08-11 ENCOUNTER — Ambulatory Visit: Payer: BC Managed Care – PPO

## 2021-08-11 ENCOUNTER — Ambulatory Visit: Payer: BC Managed Care – PPO | Admitting: Physician Assistant

## 2021-08-11 ENCOUNTER — Ambulatory Visit: Payer: BC Managed Care – PPO | Admitting: Hematology and Oncology

## 2021-08-15 ENCOUNTER — Other Ambulatory Visit (HOSPITAL_COMMUNITY): Payer: Self-pay

## 2021-08-15 ENCOUNTER — Other Ambulatory Visit: Payer: Self-pay

## 2021-08-15 ENCOUNTER — Ambulatory Visit (INDEPENDENT_AMBULATORY_CARE_PROVIDER_SITE_OTHER): Payer: BC Managed Care – PPO | Admitting: Internal Medicine

## 2021-08-15 VITALS — BP 126/74 | HR 102 | Temp 97.9°F | Resp 16 | Ht 62.0 in | Wt 174.0 lb

## 2021-08-15 DIAGNOSIS — R609 Edema, unspecified: Secondary | ICD-10-CM

## 2021-08-15 DIAGNOSIS — R7302 Impaired glucose tolerance (oral): Secondary | ICD-10-CM

## 2021-08-15 DIAGNOSIS — C7801 Secondary malignant neoplasm of right lung: Secondary | ICD-10-CM | POA: Diagnosis not present

## 2021-08-15 DIAGNOSIS — Z8639 Personal history of other endocrine, nutritional and metabolic disease: Secondary | ICD-10-CM

## 2021-08-15 DIAGNOSIS — Z87891 Personal history of nicotine dependence: Secondary | ICD-10-CM | POA: Diagnosis not present

## 2021-08-15 DIAGNOSIS — Z0000 Encounter for general adult medical examination without abnormal findings: Secondary | ICD-10-CM

## 2021-08-15 NOTE — Patient Instructions (Addendum)
It was a pleasure to see you today.  Continue treatment per Dr. Lorenso Courier.  Please call if we can be of assistance.  Return in 1 year or as needed.

## 2021-08-15 NOTE — Progress Notes (Signed)
Subjective:    Patient ID: Virginia [name], female    DOB: November 09, 1955, 65 y.o.   MRN: [id number]  HPI  64 year old Female seen for health maintenance exam and evaluation of medical issues. Just returned from [place] and has developed LE edema. Dr Lorenso Courier has her on Lasix 20 mg twice a day. Now on potassium supplement. K was low prior to trip at 3.3  Needs lipid panel. A little bit of coffee with cream this am.  Had mammogram in May. Had 2 Covid vaccines and no boosters.    Review of Systems has back pain being treated narcotic pain med.  Has dizziness and balance issues.  Has rolling walker to assist with ambulation.  She has history of adenocarcinoma of the lung stage IV.  She is followed by Dr. Lorenso Courier and Dr. Mickeal Skinner at the Cancer center.  In March 2022 she had a craniotomy with resection of progressive cerebellar mass by Dr. Vertell Limber and pathology revealed radiation necrosis.  She has been treated with Avastin and completed 3 cycles in July 2022.  She is on Decadron 1 mg daily.  She had a fall at home while going up stairs January 2020.  She hit the back of her head and lost consciousness.  CT of the brain showed an abnormality in the posterior fossa later confirmed by MRI as metastasis along with 7 additional smaller lesions.  It took her some time to regain balance and cognition.  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  Continues on Decadron.  Has some dependent edema but I think that is from her trip to [place] and she has Lasix per Dr. Lorenso Courier.  She will be seeing him tomorrow.  She has a history of smoking in the remote past.  History of hypertension and hyperlipidemia as well as GE reflux.  Had DVT of left lower extremity in March 2021.  Was on apixaban until August after MRI showed CNS bleed.  Currently on amlodipine 10 mg daily for hypertension  Remains on Protonix 40 mg daily for GE reflux  Her symptoms started in December 2020 when she was seen by video  visit for respiratory infection.  She had had 2 weeks of wheezing without fever.  Also had sore throat.  She was treated with Biaxin and Hycodan and a tapering course of prednisone.  Had considerable malaise and fatigue for several days.  Said cough was nonproductive.  Was seen again on December 29 due to lack of improvement.  Was seen virtually and a chest x-ray was ordered.  She was placed on prednisone and Levaquin.  Chest x-ray showed perihilar opacity on the right with possible mass.  Chest CT was obtained January 2021 and was found to have a 5.7 sonometer central right lower lobe lung mass associated with high-grade stenosis of the central right lower lobe airways suspicious for bronchogenic carcinoma as well as postobstructive pneumonia in the right lower lobe.  She also had right hilar, subcarinal, bilateral paratracheal and right prevascular mediastinal lymphadenopathy suspicious for metastatic disease.  Had several subcentimeter solid pulmonary nodules in both lungs and a small right pleural effusion.  Subsequently found to have metastatic lesions in the thoracic spine and L2.  Social history: She is married.  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  Social alcohol consumption.  3 adult children, 2 daughters and a son.  Had bilateral tubal ligation 1995, D&C January 2000, vaginal hysterectomy July 2000, laparoscopic cholecystectomy January 2002.  Smoked for over 20 years.  At one point quit but then resumed.  History of anxiety.  History of microscopic hematuria evaluated by Dr. Karsten Ro in 2009 and found to be benign.  History of hyperlipidemia.  History of GE reflux.  History of fractured left wrist at age 25.  Family history: Father died at age 44 of an MI.  1 sister died of complications of HIV.  Another sister died of cancer.  1 brother and 1 sister living.  Had lipoma removed from right thigh in September 2018.  Chest x-ray in 2018 due to cough was normal.  History of impaired  glucose tolerance and vitamin D deficiency.  In April 2022 her TSH was normal.  Tends to run a slightly low hemoglobin in the 11 g range.  Serum proteins low due to lack of appetite.  Liver functions have been mostly normal.  Glucose intolerance is currently not being treated at the present time.     Objective:   Physical Exam Patient just returned from [place] last evening. Blood pressure 126/74, Pulse 102 regular, RR 16 T 97.9 degrees.  Weight 174 pounds Pulse ox 97%.  Skin: Warm and dry.  No thyromegaly. Chest is clear.  Cardiac exam: Tachycardia but regular rate and rhythm.  Abdomen without palpable masses. 1+ LE edema. Neuro: conversant and pleasant. No facial weakness.       Assessment & Plan:   Metastatic lung cancer Hx of smoking LE edema- dependent treated by Dr. Lorenso Courier with Lasix Hx hyperlipidemia- currently not on statin Fatigue due to chronic illness and rigorous treatment GERD treated with Protonix HTN treated with Norvasc 10 mg daily which could contribute to LE edema  Plan: Continue treatment as directed by Dr. Lorenso Courier. RTC here in one year or as needed.

## 2021-08-16 ENCOUNTER — Inpatient Hospital Stay: Payer: BC Managed Care – PPO | Attending: Hematology and Oncology

## 2021-08-16 ENCOUNTER — Inpatient Hospital Stay (HOSPITAL_BASED_OUTPATIENT_CLINIC_OR_DEPARTMENT_OTHER): Payer: BC Managed Care – PPO | Admitting: Hematology and Oncology

## 2021-08-16 VITALS — BP 128/98 | HR 102 | Temp 97.8°F | Resp 18 | Wt 169.0 lb

## 2021-08-16 DIAGNOSIS — E876 Hypokalemia: Secondary | ICD-10-CM | POA: Insufficient documentation

## 2021-08-16 DIAGNOSIS — Z7901 Long term (current) use of anticoagulants: Secondary | ICD-10-CM | POA: Diagnosis not present

## 2021-08-16 DIAGNOSIS — I10 Essential (primary) hypertension: Secondary | ICD-10-CM | POA: Diagnosis not present

## 2021-08-16 DIAGNOSIS — Z811 Family history of alcohol abuse and dependence: Secondary | ICD-10-CM | POA: Insufficient documentation

## 2021-08-16 DIAGNOSIS — Z86718 Personal history of other venous thrombosis and embolism: Secondary | ICD-10-CM | POA: Diagnosis not present

## 2021-08-16 DIAGNOSIS — Z9181 History of falling: Secondary | ICD-10-CM | POA: Diagnosis not present

## 2021-08-16 DIAGNOSIS — Z8249 Family history of ischemic heart disease and other diseases of the circulatory system: Secondary | ICD-10-CM | POA: Diagnosis not present

## 2021-08-16 DIAGNOSIS — C7931 Secondary malignant neoplasm of brain: Secondary | ICD-10-CM | POA: Diagnosis not present

## 2021-08-16 DIAGNOSIS — Z809 Family history of malignant neoplasm, unspecified: Secondary | ICD-10-CM | POA: Insufficient documentation

## 2021-08-16 DIAGNOSIS — C778 Secondary and unspecified malignant neoplasm of lymph nodes of multiple regions: Secondary | ICD-10-CM | POA: Diagnosis not present

## 2021-08-16 DIAGNOSIS — Z9049 Acquired absence of other specified parts of digestive tract: Secondary | ICD-10-CM | POA: Diagnosis not present

## 2021-08-16 DIAGNOSIS — C3431 Malignant neoplasm of lower lobe, right bronchus or lung: Secondary | ICD-10-CM | POA: Diagnosis not present

## 2021-08-16 DIAGNOSIS — Z7952 Long term (current) use of systemic steroids: Secondary | ICD-10-CM | POA: Insufficient documentation

## 2021-08-16 DIAGNOSIS — Z5111 Encounter for antineoplastic chemotherapy: Secondary | ICD-10-CM | POA: Diagnosis not present

## 2021-08-16 DIAGNOSIS — C3491 Malignant neoplasm of unspecified part of right bronchus or lung: Secondary | ICD-10-CM

## 2021-08-16 DIAGNOSIS — R112 Nausea with vomiting, unspecified: Secondary | ICD-10-CM | POA: Insufficient documentation

## 2021-08-16 DIAGNOSIS — Z5112 Encounter for antineoplastic immunotherapy: Secondary | ICD-10-CM | POA: Insufficient documentation

## 2021-08-16 DIAGNOSIS — Z79899 Other long term (current) drug therapy: Secondary | ICD-10-CM | POA: Insufficient documentation

## 2021-08-16 DIAGNOSIS — R42 Dizziness and giddiness: Secondary | ICD-10-CM | POA: Insufficient documentation

## 2021-08-16 DIAGNOSIS — R918 Other nonspecific abnormal finding of lung field: Secondary | ICD-10-CM

## 2021-08-16 DIAGNOSIS — Z87891 Personal history of nicotine dependence: Secondary | ICD-10-CM | POA: Insufficient documentation

## 2021-08-16 DIAGNOSIS — Z923 Personal history of irradiation: Secondary | ICD-10-CM | POA: Insufficient documentation

## 2021-08-16 DIAGNOSIS — C7951 Secondary malignant neoplasm of bone: Secondary | ICD-10-CM

## 2021-08-16 DIAGNOSIS — Z95828 Presence of other vascular implants and grafts: Secondary | ICD-10-CM

## 2021-08-16 LAB — CMP (CANCER CENTER ONLY)
ALT: 21 U/L (ref 0–44)
AST: 19 U/L (ref 15–41)
Albumin: 3.2 g/dL — ABNORMAL LOW (ref 3.5–5.0)
Alkaline Phosphatase: 54 U/L (ref 38–126)
Anion gap: 11 (ref 5–15)
BUN: 10 mg/dL (ref 8–23)
CO2: 25 mmol/L (ref 22–32)
Calcium: 7.8 mg/dL — ABNORMAL LOW (ref 8.9–10.3)
Chloride: 107 mmol/L (ref 98–111)
Creatinine: 0.76 mg/dL (ref 0.44–1.00)
GFR, Estimated: >60 mL/min (ref >60)
Glucose, Bld: 195 mg/dL — ABNORMAL HIGH (ref 70–99)
Potassium: 3.7 mmol/L (ref 3.5–5.1)
Sodium: 143 mmol/L (ref 135–145)
Total Bilirubin: 0.4 mg/dL (ref 0.3–1.2)
Total Protein: 6.7 g/dL (ref 6.5–8.1)

## 2021-08-16 LAB — CBC WITH DIFFERENTIAL (CANCER CENTER ONLY)
Abs Immature Granulocytes: 0.07 10*3/uL (ref 0.00–0.07)
Basophils Absolute: 0 10*3/uL (ref 0.0–0.1)
Basophils Relative: 0 %
Eosinophils Absolute: 0 10*3/uL (ref 0.0–0.5)
Eosinophils Relative: 0 %
HCT: 35.7 % — ABNORMAL LOW (ref 36.0–46.0)
Hemoglobin: 12.1 g/dL (ref 12.0–15.0)
Immature Granulocytes: 1 %
Lymphocytes Relative: 13 %
Lymphs Abs: 1.1 10*3/uL (ref 0.7–4.0)
MCH: 34.4 pg — ABNORMAL HIGH (ref 26.0–34.0)
MCHC: 33.9 g/dL (ref 30.0–36.0)
MCV: 101.4 fL — ABNORMAL HIGH (ref 80.0–100.0)
Monocytes Absolute: 0.5 10*3/uL (ref 0.1–1.0)
Monocytes Relative: 6 %
Neutro Abs: 6.6 10*3/uL (ref 1.7–7.7)
Neutrophils Relative %: 80 %
Platelet Count: 215 10*3/uL (ref 150–400)
RBC: 3.52 MIL/uL — ABNORMAL LOW (ref 3.87–5.11)
RDW: 13.6 % (ref 11.5–15.5)
WBC Count: 8.2 10*3/uL (ref 4.0–10.5)
nRBC: 0 % (ref 0.0–0.2)

## 2021-08-16 LAB — LACTATE DEHYDROGENASE: LDH: 229 U/L — ABNORMAL HIGH (ref 98–192)

## 2021-08-16 MED ORDER — SODIUM CHLORIDE 0.9% FLUSH
10.0000 mL | INTRAVENOUS | Status: AC | PRN
  Administered 2021-08-16: 10 mL

## 2021-08-16 MED ORDER — HEPARIN SOD (PORK) LOCK FLUSH 100 UNIT/ML IV SOLN
500.0000 [IU] | INTRAVENOUS | Status: AC | PRN
  Administered 2021-08-16: 500 [IU]

## 2021-08-16 NOTE — Progress Notes (Signed)
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 CT of the head showed showed concerning for metastatic disease involving the left cerebellum and right occipital lobe.  2) 09/30/2019: MRI brain confirms mass within the left cerebellum measures 1.6 x 1.3 x 1.5 cm as well as at least 8 metastatic lesions within the supratentorial and infratentorial brain as outlined 3) 10/01/2019: PET CT scan revealed hypermetabolic infrahilar right lower lobe mass with hypermetabolic nodal metastases in the right hilum, mediastinum, right supraclavicular region, left axilla and lower left neck. 4) 10/08/2019: US guided biopsy of the lymph nodes confirms poorly differentiated non-small cell carcinoma. Immunohistochemistry confirms adenocarcinoma of lung primary. PD-L1 and NGS later revealed a MET Exon 14 mutation and TPS of 90%.  5) 10/12/2019: Establish care with Dr. Lorenso Courier  6) 11/04/2019: Day 1 of capmatinib 475m BID 7) 11/09/2019: presented to Rad/Onc with a DVT in LLE. Seen in symptom management clinic and started on apixaban.  8) 12/29/2019: CT C/A/P showed interval decrease in size of mass involving the superior segment of right lower lobe and reduction in mediastinal and left supraclavicular adenopathy though some small spinal lesions were noted in the interim between the two sets of scans 9) 01/25/2020: temporarily stopped capmatinib due to symptoms of nausea/fatigue. Restarted therapy on 01/30/2020 after brief chemo holiday.  10) 03/22/2020: CT C/A/P reveals a mixed picture, with new lung nodule and  increased lymphadenopathy, however response in bone lesions. D/c capmatinib therapy 11) 04/08/2020: start of Carbo/Pem/Pem for 2nd line treatment. Cycle 1 Day 1   12) 04/29/2020: Cycle 2 Day 1 Carbo/Pem/Pem  13) 05/20/2020: Cycle 3 Day 1 Carbo/Pem/Pem  14) 06/09/2020: Cycle 4 Day 1 Carbo/Pem/Pem  15) 06/29/2020: Cycle 5 Day 1 maintenance Pem/Pem  16) 07/20/2020: Cycle 6 Day 1 maintenance Pem/Pem  17) 08/12/2020: Cycle 7 Day 1 maintenance Pem/Pem  18) 09/01/2020: Cycle 8 Day 1 maintenance Pem/Pem  19) 09/21/2020: Cycle 9 Day 1 maintenance Pem/Pem  20) 10/13/2020: Cycle 10 Day 1 maintenance Pem/Pem  21) 11/03/2020: Cycle 11 Day 1 maintenance Pem/Pem  22) 11/25/2020:  Cycle 12 Day 1 maintenance Pem/Pem 23) 12/15/2020:  Cycle 13 Day 1 maintenance Pem/Pem 24) 01/05/2021:  Cycle 14 Day 1 maintenance Pem/Pem 25) 01/26/2021:  Cycle 15 Day 1 maintenance Pem/Pem plus Becacizumab.  02/17/2021: Cycle 16 Day 1 maintenance Pem/Pem plus Becacizumab.  03/10/2021: Cycle 17 Day 1 maintenance Pem/Pem plus Becacizumab.  03/31/2021: Cycle 18 Day 1 maintenance Pem/Pem  04/20/2021:  Cycle 19 Day 1 maintenance Pem/Pem  05/12/2021: Cycle 20 Day 1 maintenance Pem/Pem 06/01/2021: Cycle 21 Day 1 maintenance Pem/Pem 06/22/2021: Cycle 22 Day 1 maintenance Pem/Pem 07/13/2021: Cycle 23 Day 1 maintenance Pem/Pem 08/16/2021: Cycle 24 Day 1 maintenance Pem/Pem  Interval History:  Virginia MERKEY65y.o. female with medical history significant for metastatic adenocarcinoma of the lung presents for a follow up visit. The patient's last visit was on 07/13/2021.  In the interim since the last visit, she has continued pem/pem maintenance. Today is Cycle 24 Day 1.  The start of this cycle is delayed as the patient was on an extended vacation.  On exam today Virginia [name] is  accompanied by her husband.  She reports she had an excellent time in [place] with her husband.  They ended up staying additional week.  She is quite tanned from this expedition.   She did however eat plenty of salty seafood and has more swelling in her lower extremities.  She reports that she did have an episode of dizziness for which they increased her steroid dosing.  She notes that she is also continuing to take the Lasix due to the increased swelling.  Otherwise she had no problems with her last cycle of chemotherapy and no problems over the course of her vacation.  She is not having any issues with bleeding or bruising.  She denies any fevers, chills, shortness of breath, chest pain, cough, neuropathy or skin changes. She has no other complaints.  A full 10 point ROS is listed below.   MEDICAL HISTORY:  Past Medical History:  Diagnosis Date   Anemia    Anxiety    Concussion 09/28/2019   DVT (deep venous thrombosis) ([place]) 2021   L leg   Dyspnea    GERD (gastroesophageal reflux disease)    Hypercholesterolemia    per pt, she does not have elevated lipids   Hypertension    met lung ca dx'd 09/2019   mets to spine, hip and brain   PONV (postoperative nausea and vomiting)    Tobacco abuse     SURGICAL HISTORY: Past Surgical History:  Procedure Laterality Date   ABDOMINAL HYSTERECTOMY     partial/ left ovaries   APPLICATION OF CRANIAL NAVIGATION N/A 12/02/2020   Procedure: APPLICATION OF CRANIAL NAVIGATION;  Surgeon: Erline Levine, MD;  Location: [place];  Service: Neurosurgery;  Laterality: N/A;   CHOLECYSTECTOMY     CRANIOTOMY N/A 12/02/2020   Procedure: Posterior fossa craniotomy for tumor resection with brainlab;  Surgeon: Erline Levine, MD;  Location: [place];  Service: Neurosurgery;  Laterality: N/A;   DILATION AND CURETTAGE OF UTERUS     IR IMAGING GUIDED PORT INSERTION  10/23/2019   KYPHOPLASTY N/A 03/15/2020   Procedure: Thoracic Eight KYPHOPLASTY;  Surgeon: Erline Levine, MD;  Location: [place];  Service: Neurosurgery;  Laterality: N/A;  prone    LIPOMA EXCISION  2018   removed under left breast and right thigh.   TUBAL LIGATION      ALLERGIES:  has No  Known Allergies.  MEDICATIONS:  Current Outpatient Medications  Medication Sig Dispense Refill   acetaminophen (TYLENOL) 500 MG tablet Take 1,000 mg by mouth every 8 (eight) hours as needed for moderate pain.     albuterol (VENTOLIN HFA) 108 (90 Base) MCG/ACT inhaler INHALE 2 PUFFS INTO THE LUNGS EVERY 6 HOURS AS NEEDED FOR WHEEZING OR SHORTNESS OF BREATH (Patient taking differently: Inhale 2 puffs into the lungs every 6 (six) hours as needed for shortness of breath or wheezing.) 6.7 g 11   ALPRAZolam (XANAX) 0.25 MG tablet Take 1 tablet (0.25 mg total) by mouth 2 (two) times daily as needed for anxiety 60 tablet 5   amLODipine (NORVASC) 10 MG tablet Take 1 tablet (10 mg total) by mouth daily. 30 tablet 3   Cyanocobalamin (VITAMIN B12 PO) Take 2 tablets by mouth daily. Gummies     cyclobenzaprine (FLEXERIL) 5 MG tablet Take 5 mg by mouth at bedtime.     dexamethasone (DECADRON) 1 MG tablet Take 2 tablets (2 mg total) by mouth daily. 60 tablet 1   folic acid (FOLVITE) 1 MG tablet Take 1 tablet by mouth daily.  90 tablet 1   furosemide (LASIX) 20 MG tablet Take 1 tablet by mouth daily. 90 tablet 1   lidocaine-prilocaine (EMLA) cream Apply to port as needed. 30 g 0   Multiple Vitamins-Minerals (AIRBORNE PO) Take 1 tablet by mouth daily.      ofloxacin (OCUFLOX) 0.3 % ophthalmic solution 2 drops each eye 4 times a day for 5 days (Patient taking differently: Place 2 drops into both eyes daily as needed (irritation).) 5 mL 1   OLANZapine (ZYPREXA) 2.5 MG tablet TAKE 1 TABLET BY MOUTH EVERY NIGHT AT BEDTIME 90 tablet 1   ondansetron (ZOFRAN) 4 MG tablet TAKE 1 TO 2 TABLETS BY MOUTH 2 TIMES DAILY AS NEEDED 60 tablet 0   oxyCODONE (OXY IR/ROXICODONE) 5 MG immediate release tablet TAKE 1 TO 2 TABLETS BY MOUTH EVERY 4 HOURS AS NEEDED FOR SEVERE PAIN 90 tablet 0   pantoprazole (PROTONIX) 40 MG tablet TAKE ONE TABLET BY MOUTH DAILY (Patient taking differently: Take 40 mg by mouth daily.) 90 tablet 3    pentoxifylline (TRENTAL) 400 MG CR tablet TAKE 1 TABLET BY MOUTH WITH FOOD FOR 1 WEEK THEN TAKE 1 TABLET BY MOUTH 2 TIMES DAILY THEREAFTER 60 tablet 2   polyethylene glycol (MIRALAX / GLYCOLAX) 17 g packet Take 17 g by mouth 2 (two) times daily.      potassium chloride SA (KLOR-CON M) 20 MEQ tablet Take 1 tablet by mouth daily. 30 tablet 2   prochlorperazine (COMPAZINE) 10 MG tablet Take 10 mg by mouth every 6 (six) hours as needed for nausea or vomiting.     promethazine (PHENERGAN) 12.5 MG tablet Take 1 to 2 tablets by mouth every 6 hours as needed for nausea 20 tablet 0   Vitamin D3 (VITAMIN D) 25 MCG tablet Take 2,000 Units by mouth daily.     vitamin E 180 MG (400 UNITS) capsule Take 400 IU daily x 1 week, then 400 IU BID (Patient taking differently: Take 400 Units by mouth in the morning and at bedtime. Take 400 IU daily x 1 week, then 400 IU BID) 60 capsule 4   XTAMPZA ER 13.5 MG C12A Take 1 capsule by mouth in the morning and at bedtime. (every 12 hours) 60 capsule 0   No current facility-administered medications for this visit.   Facility-Administered Medications Ordered in Other Visits  Medication Dose Route Frequency Provider Last Rate Last Admin   sodium chloride flush (NS) 0.9 % injection 10 mL  10 mL Intracatheter PRN Orson Slick, MD   10 mL at 08/17/21 1523    REVIEW OF SYSTEMS:   Constitutional: ( - ) fevers, ( - )  chills , ( - ) night sweats Eyes: ( - ) blurriness of vision, ( - ) double vision, ( - ) watery eyes Ears, nose, mouth, throat, and face: ( - ) mucositis, ( - ) sore throat Respiratory: ( - ) cough, ( - ) dyspnea, ( - ) wheezes Cardiovascular: ( - ) palpitation, ( - ) chest discomfort, ( - ) lower extremity swelling Gastrointestinal:  ( - ) nausea, ( - ) heartburn, ( - ) change in bowel habits Skin: ( - ) abnormal skin rashes Lymphatics: ( - ) new lymphadenopathy, ( - ) easy bruising Neurological: ( - ) numbness, ( - ) tingling, ( - ) new  weaknesses Behavioral/Psych: ( - ) mood change, ( - ) new changes  All other systems were reviewed with the patient and are negative.  PHYSICAL EXAMINATION: ECOG PERFORMANCE STATUS: 1 - Symptomatic but completely ambulatory  Vitals:   08/16/21 1521  BP: (!) 128/98  Pulse: (!) 102  Resp: 18  Temp: 97.8 F (36.6 C)  SpO2: 99%   Filed Weights   08/16/21 1521  Weight: 169 lb (76.7 kg)    GENERAL: well appearing middle aged Caucasian female in NAD  SKIN: skin color, texture, turgor are normal, no rashes or significant lesions EYES: conjunctiva are pink and non-injected, sclera clear LUNGS: wheezing at lung bases. clear to auscultation and percussion with normal breathing effort HEART: regular rate & rhythm and no murmurs and bilateral + 2 pitting lower extremity edema Musculoskeletal: no cyanosis of digits and no clubbing PSYCH: alert & oriented x 3, fluent speech NEURO: no focal motor/sensory deficits    LABORATORY DATA:  I have reviewed the data as listed CBC Latest Ref Rng & Units 08/16/2021 07/13/2021 06/22/2021  WBC 4.0 - 10.5 K/uL 8.2 8.3 5.7  Hemoglobin 12.0 - 15.0 g/dL 12.1 12.7 12.0  Hematocrit 36.0 - 46.0 % 35.7(L) 36.8 33.6(L)  Platelets 150 - 400 K/uL 215 343 251    CMP Latest Ref Rng & Units 08/16/2021 07/13/2021 06/22/2021  Glucose 70 - 99 mg/dL 195(H) 104(H) 123(H)  BUN 8 - 23 mg/dL _0 Creatinine 0.44 - 1.00 mg/dL 0.76 0.70 0.56  Sodium 135 - 145 mmol/L 143 142 138  Potassium 3.5 - 5.1 mmol/L 3.7 3.3(L) 3.0(L)  Chloride 98 - 111 mmol/L 107 104 102  CO2 22 - 32 mmol/L _1 Calcium 8.9 - 10.3 mg/dL 7.8(L) 8.8(L) 8.7(L)  Total Protein 6.5 - 8.1 g/dL 6.7 6.8 6.5  Total Bilirubin 0.3 - 1.2 mg/dL 0.4 0.4 0.7  Alkaline Phos 38 - 126 U/L 54 62 62  AST 15 - 41 U/L _2 ALT 0 - 44 U/L _3 RADIOGRAPHIC STUDIES: I personally have viewed the radiographic studies below: No results found.   ASSESSMENT & PLAN VAISHALI BAISE 65 y.o.  female with medical history significant for metastatic adenocarcinoma of the lung presents for a follow up visit. 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   Previously we discussed Carboplatin/Pembrolizumab/Pemetrexed and the expected side effect from these medications.  We discussed the possible side effects of immunotherapy including colitis, hepatitis, dermatitis, and pneumonitis.  Additionally we discussed the side effects of carboplatin which include suppression of blood counts, nephrotoxicity, and nausea.  Additionally we discussed pemetrexed which can also have effects on counts and would require supplementation with vitamin A21 and folic acid.  She voiced her understanding of these side effects and treatment plans moving forward.  I also noted that we would be able to drop the chemotherapy agents that she had intolerance to continue with pembrolizumab alone given her high TPS score.  The patient has a markedly elevated TPS score (90%)   Virginia [name] continues to tolerate the treatment. Her labs from today were reviewed without any intervention needed. Patient will proceed with treatment as planned and return to the clinic in 3 weeks prior to her next cycle.   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 Ended on 03/10/2021.  --patient will be followed every 3 weeks with labs  and clinic visit. --port in place, patient completed chemotherapy education.  --plan for repeat CT C/A/P in Jan 2023 (q 3 months) --supportive therapy as listed below.  --RTC q 3 weeks for continued monitoring/chemotherapy treatments.    #Nausea/Vomiting, improving -- provided patient with Zofran 4-19m q8h PRN and compazine [id number] for breakthrough PRN.  She is currently using Phenergan for  breakthrough. --continue olanzpaine 2.5 mg PO QHS to help with nausea. Marked improvement since the addition of this medication on 01/29/2020.  --continue to monitor   #Hypokalemia, stable --likely 2/2 to decreased PO intake, hypovolemia, and BP medications --Potassium level 3.7 today.   --prescribed 2063mPO potassium daily.  --continue to monitor  #Pain Control, stable --continue oxycodone 5mg31mH PRN. Can increase dose to 10mg56m if pain is severe. She is taking approximately 2-3 pills per day.  --continue Xtampza ER BID for long acting support.  --continue to take with Senokot to prevent opioid induced constipation. 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  --nausea as above    #Brain Metastasis, stable --appreciate the assistance of Dr. VasloMickeal Skinnerreating her brain metastasis and symptoms. --radiation therapy to the brain completed on 11/16/2019.  --MRI brain on 04/29/2020 showed evidence of small brain bleed. Eliquis therapy was discontinued.  --MRI on 11/04/2020 showed concern for progression of intracranial disease.  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  --MRI on 12/23/2020 showed evidence of radiation necrosis. Dr. VasloMickeal Skinneruated the patient on 01/02/2021 with recommendations to add IV Bevacizumab 10 mg/kg q 3 weeks ( started 01/26/2021). Completed a full course of this therapy. --05/26/2021: MRI brain showed no substantial changes.   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    # Hypertension: --BP at today's visit was 128/98. Continue Norvasc to 10 mg daily.  --continue to monitor at home and in clinic.    Orders Placed This Encounter   Procedures   CT CHEST ABDOMEN PELVIS W CONTRAST    Standing Status:   Future    Standing Expiration Date:   08/17/2022    Order Specific Question:   [facility]    [facility]   Is Oral Contrast requested for this exam?    Answer:   Yes, Per Radiology protocol    All questions were answered. The patient knows to call the clinic with any problems, questions or concerns.  A total of more than 30 minutes were spent on this encounter and over half of that time was spent on counseling and coordination of care as outlined above.   Virginia [name] Ledell PeoplesDepartment of Hematology/Oncology [facility]: [phone]: [phone]: [email or web address]   08/17/2021 4:10 PM   Literature Support:  GandhLorenza Chickrguez-Abreu D, GadgeDuffy Rhodyip E, De Angelis F, Domine M, ClingKieferhmair MJ, PowelCooksvillengHendersonchoff HG, PeledAmarillossi F, JenneMontpelierk M, Hui RSignal HillonCassodayerBufaloio-Viqueira B, Novello S, Kurata T, Gray JE, Vida J, Wei Z, Yang J, Raftopoulos H, PietaMerkelasGregoryKWeyerhaeuser [id number]. Pembrolizumab plus Chemotherapy in Metastatic Non-Small-Cell Lung Cancer. N Engl J Med.  2018 May 31;378(22):2078-2092.  --Median progression-free survival was 8.8 months (95% CI, 7.6 to 9.2) in the pembrolizumab-combination group and 4.9 months (95% CI, 4.7 to 5.5) in the placebo-combination group (hazard ratio for disease progression or death, 0.52; 95% CI, 0.43 to 0.64; P<0.001). Adverse events of grade 3 or higher occurred in 67.2% of the patients in the pembrolizumab-combination group and in 65.8% of those in the placebo-combination group.  Jodene Nam L. Efficacy of pemetrexed-based regimens in advanced non-small cell lung cancer patients with activating epidermal growth factor receptor mutations after tyrosine kinase inhibitor  failure: a systematic review. Onco Targets Ther. 2018;11:2121-2129.   --The weighted median PFS, median OS, and ORR for patients treated with pem regimens were 5.09 months, 15.91 months, and 30.19%, respectively. Our systematic review results showed a favorable efficacy profile of pem regimens in NSCLC patients with EGFR mutation after EGFR-TKI failure.

## 2021-08-17 ENCOUNTER — Inpatient Hospital Stay: Payer: BC Managed Care – PPO

## 2021-08-17 ENCOUNTER — Other Ambulatory Visit (HOSPITAL_COMMUNITY): Payer: Self-pay

## 2021-08-17 ENCOUNTER — Encounter: Payer: Self-pay | Admitting: Hematology and Oncology

## 2021-08-17 ENCOUNTER — Other Ambulatory Visit: Payer: Self-pay | Admitting: *Deleted

## 2021-08-17 ENCOUNTER — Other Ambulatory Visit: Payer: Self-pay | Admitting: Hematology and Oncology

## 2021-08-17 ENCOUNTER — Other Ambulatory Visit: Payer: Self-pay

## 2021-08-17 VITALS — BP 145/93 | HR 99 | Temp 98.8°F | Resp 18

## 2021-08-17 DIAGNOSIS — I10 Essential (primary) hypertension: Secondary | ICD-10-CM | POA: Diagnosis not present

## 2021-08-17 DIAGNOSIS — Z923 Personal history of irradiation: Secondary | ICD-10-CM | POA: Diagnosis not present

## 2021-08-17 DIAGNOSIS — Z5111 Encounter for antineoplastic chemotherapy: Secondary | ICD-10-CM | POA: Diagnosis not present

## 2021-08-17 DIAGNOSIS — C7931 Secondary malignant neoplasm of brain: Secondary | ICD-10-CM

## 2021-08-17 DIAGNOSIS — R42 Dizziness and giddiness: Secondary | ICD-10-CM | POA: Diagnosis not present

## 2021-08-17 DIAGNOSIS — C778 Secondary and unspecified malignant neoplasm of lymph nodes of multiple regions: Secondary | ICD-10-CM | POA: Diagnosis not present

## 2021-08-17 DIAGNOSIS — R112 Nausea with vomiting, unspecified: Secondary | ICD-10-CM | POA: Diagnosis not present

## 2021-08-17 DIAGNOSIS — C7951 Secondary malignant neoplasm of bone: Secondary | ICD-10-CM | POA: Diagnosis not present

## 2021-08-17 DIAGNOSIS — E876 Hypokalemia: Secondary | ICD-10-CM | POA: Diagnosis not present

## 2021-08-17 DIAGNOSIS — Z79899 Other long term (current) drug therapy: Secondary | ICD-10-CM | POA: Diagnosis not present

## 2021-08-17 DIAGNOSIS — Z7952 Long term (current) use of systemic steroids: Secondary | ICD-10-CM | POA: Diagnosis not present

## 2021-08-17 DIAGNOSIS — C3431 Malignant neoplasm of lower lobe, right bronchus or lung: Secondary | ICD-10-CM | POA: Diagnosis not present

## 2021-08-17 DIAGNOSIS — Z5112 Encounter for antineoplastic immunotherapy: Secondary | ICD-10-CM | POA: Diagnosis not present

## 2021-08-17 DIAGNOSIS — Z86718 Personal history of other venous thrombosis and embolism: Secondary | ICD-10-CM | POA: Diagnosis not present

## 2021-08-17 DIAGNOSIS — Z87891 Personal history of nicotine dependence: Secondary | ICD-10-CM | POA: Diagnosis not present

## 2021-08-17 DIAGNOSIS — R918 Other nonspecific abnormal finding of lung field: Secondary | ICD-10-CM

## 2021-08-17 DIAGNOSIS — Z7901 Long term (current) use of anticoagulants: Secondary | ICD-10-CM | POA: Diagnosis not present

## 2021-08-17 MED ORDER — SODIUM CHLORIDE 0.9 % IV SOLN: Freq: Once | INTRAVENOUS | Status: AC

## 2021-08-17 MED ORDER — POTASSIUM CHLORIDE CRYS ER 20 MEQ PO TBCR
20.0000 meq | EXTENDED_RELEASE_TABLET | Freq: Every day | ORAL | 2 refills | Status: DC
  Filled 2021-08-17: qty 30, 30d supply, fill #0
  Filled 2021-09-11: qty 30, 30d supply, fill #1

## 2021-08-17 MED ORDER — SODIUM CHLORIDE 0.9 % IV SOLN
200.0000 mg | Freq: Once | INTRAVENOUS | Status: AC
  Administered 2021-08-17: 200 mg via INTRAVENOUS
  Filled 2021-08-17: qty 8

## 2021-08-17 MED ORDER — PROCHLORPERAZINE MALEATE 10 MG PO TABS
10.0000 mg | ORAL_TABLET | Freq: Once | ORAL | Status: AC
  Administered 2021-08-17: 10 mg via ORAL
  Filled 2021-08-17: qty 1

## 2021-08-17 MED ORDER — SODIUM CHLORIDE 0.9 % IV SOLN
500.0000 mg/m2 | Freq: Once | INTRAVENOUS | Status: AC
  Administered 2021-08-17: 900 mg via INTRAVENOUS
  Filled 2021-08-17: qty 20

## 2021-08-17 MED ORDER — HEPARIN SOD (PORK) LOCK FLUSH 100 UNIT/ML IV SOLN
500.0000 [IU] | Freq: Once | INTRAVENOUS | Status: AC | PRN
  Administered 2021-08-17: 500 [IU]

## 2021-08-17 MED ORDER — SODIUM CHLORIDE 0.9% FLUSH
10.0000 mL | INTRAVENOUS | Status: DC | PRN
  Administered 2021-08-17: 10 mL

## 2021-08-17 NOTE — Patient Instructions (Signed)
[facility]   Discharge Instructions: Thank you for choosing [facility] to provide your oncology and hematology care.   If you have a lab appointment with the [facility], please go directly to the [facility] and check in at the registration area.   Wear comfortable clothing and clothing appropriate for easy access to any Portacath or PICC line.   We strive to give you quality time with your provider. You may need to reschedule your appointment if you arrive late (15 or more minutes).  Arriving late affects you and other patients whose appointments are after yours.  Also, if you miss three or more appointments without notifying the office, you may be dismissed from the clinic at the provider's discretion.      For prescription refill requests, have your pharmacy contact our office and allow 72 hours for refills to be completed.    Today you received the following chemotherapy and/or immunotherapy agents: pembrolizumab and pemetrexed.      To help prevent nausea and vomiting after your treatment, we encourage you to take your nausea medication as directed.  BELOW ARE SYMPTOMS THAT SHOULD BE REPORTED IMMEDIATELY: *FEVER GREATER THAN 100.4 F (38 C) OR HIGHER *CHILLS OR SWEATING *NAUSEA AND VOMITING THAT IS NOT CONTROLLED WITH YOUR NAUSEA MEDICATION *UNUSUAL SHORTNESS OF BREATH *UNUSUAL BRUISING OR BLEEDING *URINARY PROBLEMS (pain or burning when urinating, or frequent urination) *BOWEL PROBLEMS (unusual diarrhea, constipation, pain near the anus) TENDERNESS IN MOUTH AND THROAT WITH OR WITHOUT PRESENCE OF ULCERS (sore throat, sores in mouth, or a toothache) UNUSUAL RASH, SWELLING OR PAIN  UNUSUAL VAGINAL DISCHARGE OR ITCHING   Items with * indicate a potential emergency and should be followed up as soon as possible or go to the Emergency Department if any problems should occur.  Please show the CHEMOTHERAPY ALERT CARD or IMMUNOTHERAPY  ALERT CARD at check-in to the Emergency Department and triage nurse.  Should you have questions after your visit or need to cancel or reschedule your appointment, please contact [facility]  Dept: [phone]  and follow the prompts.  Office hours are 8:00 a.m. to 4:30 p.m. Monday - Friday. Please note that voicemails left after 4:00 p.m. may not be returned until the following business day.  We are closed weekends and major holidays. You have access to a nurse at all times for urgent questions. Please call the main number to the clinic Dept: [phone] and follow the prompts.   For any non-urgent questions, you may also contact your provider using MyChart. We now offer e-Visits for anyone 39 and older to request care online for non-urgent symptoms. For details visit [email or web address].   Also download the MyChart app! Go to the app store, search "MyChart", open the app, select [facility], and log in with your MyChart username and password.  Due to Covid, a mask is required upon entering the hospital/clinic. If you do not have a mask, one will be given to you upon arrival. For doctor visits, patients may have 1 support person aged 42 or older with them. For treatment visits, patients cannot have anyone with them due to current Covid guidelines and our immunocompromised population.

## 2021-08-19 DIAGNOSIS — I6789 Other cerebrovascular disease: Secondary | ICD-10-CM | POA: Diagnosis not present

## 2021-08-24 ENCOUNTER — Telehealth: Payer: Self-pay | Admitting: *Deleted

## 2021-08-24 ENCOUNTER — Other Ambulatory Visit: Payer: Self-pay

## 2021-08-24 ENCOUNTER — Ambulatory Visit
Admission: RE | Admit: 2021-08-24 | Discharge: 2021-08-24 | Disposition: A | Discharge status: Home / Self Care | Payer: BC Managed Care – PPO | Source: Ambulatory Visit | Attending: Internal Medicine | Admitting: Internal Medicine

## 2021-08-24 DIAGNOSIS — C7931 Secondary malignant neoplasm of brain: Secondary | ICD-10-CM

## 2021-08-24 DIAGNOSIS — Y842 Radiological procedure and radiotherapy as the cause of abnormal reaction of the patient, or of later complication, without mention of misadventure at the time of the procedure: Secondary | ICD-10-CM

## 2021-08-24 DIAGNOSIS — R220 Localized swelling, mass and lump, head: Secondary | ICD-10-CM | POA: Diagnosis not present

## 2021-08-24 DIAGNOSIS — C3490 Malignant neoplasm of unspecified part of unspecified bronchus or lung: Secondary | ICD-10-CM | POA: Diagnosis not present

## 2021-08-24 MED ORDER — GADOBENATE DIMEGLUMINE 529 MG/ML IV SOLN
15.0000 mL | Freq: Once | INTRAVENOUS | Status: AC | PRN
  Administered 2021-08-24: 15 mL via INTRAVENOUS

## 2021-08-24 NOTE — Telephone Encounter (Signed)
Received call from pt's husband, [name]. He is requesting refill of [name]'s oxycodone 5 mg tabs and her Xtampza. Both were last filled on 07/25/21 @ [facility] pt pharm

## 2021-08-25 ENCOUNTER — Other Ambulatory Visit (HOSPITAL_COMMUNITY): Payer: Self-pay

## 2021-08-25 ENCOUNTER — Other Ambulatory Visit: Payer: Self-pay | Admitting: Hematology and Oncology

## 2021-08-25 MED ORDER — OXYCODONE HCL 5 MG PO TABS
ORAL_TABLET | ORAL | 0 refills | Status: DC
  Filled 2021-08-25: qty 90, 15d supply, fill #0

## 2021-08-25 MED ORDER — XTAMPZA ER 13.5 MG PO C12A
1.0000 | EXTENDED_RELEASE_CAPSULE | Freq: Two times a day (BID) | ORAL | 0 refills | Status: DC
Start: 2021-08-25 — End: 2021-09-25
  Filled 2021-08-25: qty 60, 30d supply, fill #0

## 2021-08-27 ENCOUNTER — Other Ambulatory Visit: Payer: Self-pay

## 2021-08-28 ENCOUNTER — Inpatient Hospital Stay: Payer: BC Managed Care – PPO

## 2021-08-29 ENCOUNTER — Other Ambulatory Visit: Payer: Self-pay

## 2021-08-29 ENCOUNTER — Inpatient Hospital Stay (HOSPITAL_BASED_OUTPATIENT_CLINIC_OR_DEPARTMENT_OTHER): Payer: BC Managed Care – PPO | Admitting: Internal Medicine

## 2021-08-29 VITALS — BP 129/92 | HR 98 | Temp 98.1°F | Resp 17 | Ht 62.0 in | Wt 170.0 lb

## 2021-08-29 DIAGNOSIS — Z923 Personal history of irradiation: Secondary | ICD-10-CM | POA: Diagnosis not present

## 2021-08-29 DIAGNOSIS — C7931 Secondary malignant neoplasm of brain: Secondary | ICD-10-CM

## 2021-08-29 DIAGNOSIS — Z7901 Long term (current) use of anticoagulants: Secondary | ICD-10-CM | POA: Diagnosis not present

## 2021-08-29 DIAGNOSIS — Z5112 Encounter for antineoplastic immunotherapy: Secondary | ICD-10-CM | POA: Diagnosis not present

## 2021-08-29 DIAGNOSIS — C3431 Malignant neoplasm of lower lobe, right bronchus or lung: Secondary | ICD-10-CM | POA: Diagnosis not present

## 2021-08-29 DIAGNOSIS — Z86718 Personal history of other venous thrombosis and embolism: Secondary | ICD-10-CM | POA: Diagnosis not present

## 2021-08-29 DIAGNOSIS — Z5111 Encounter for antineoplastic chemotherapy: Secondary | ICD-10-CM | POA: Diagnosis not present

## 2021-08-29 DIAGNOSIS — C7951 Secondary malignant neoplasm of bone: Secondary | ICD-10-CM | POA: Diagnosis not present

## 2021-08-29 DIAGNOSIS — R42 Dizziness and giddiness: Secondary | ICD-10-CM | POA: Diagnosis not present

## 2021-08-29 DIAGNOSIS — Z87891 Personal history of nicotine dependence: Secondary | ICD-10-CM | POA: Diagnosis not present

## 2021-08-29 DIAGNOSIS — C778 Secondary and unspecified malignant neoplasm of lymph nodes of multiple regions: Secondary | ICD-10-CM | POA: Diagnosis not present

## 2021-08-29 DIAGNOSIS — I10 Essential (primary) hypertension: Secondary | ICD-10-CM | POA: Diagnosis not present

## 2021-08-29 DIAGNOSIS — Z79899 Other long term (current) drug therapy: Secondary | ICD-10-CM | POA: Diagnosis not present

## 2021-08-29 DIAGNOSIS — R112 Nausea with vomiting, unspecified: Secondary | ICD-10-CM | POA: Diagnosis not present

## 2021-08-29 DIAGNOSIS — Z7952 Long term (current) use of systemic steroids: Secondary | ICD-10-CM | POA: Diagnosis not present

## 2021-08-29 DIAGNOSIS — E876 Hypokalemia: Secondary | ICD-10-CM | POA: Diagnosis not present

## 2021-08-29 NOTE — Progress Notes (Signed)
[facility] [place], [place] [address] [phone]   Interval Evaluation  Date of Service: 08/29/21 Patient Name: Virginia [name] Patient MRN: [id number] Patient DOB: 13-Jan-1956 Provider: Ventura Sellers, MD  Identifying Statement:  Virginia [name] is a 65 y.o. 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 Isidore Moos 12/02/20: Craniotomy, resection of progressive cerebellar mass by Dr. Vertell Limber.  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  She and her husband just returned home from extended vacation in [place]  Dizziness and balance issues are stable, described as unchanged.  Currently she is dosing the decadron just 1100min AM as prior.  Around the home she gets around independently, or sometimes with walker assist.  She otherwise describes no new or progressive neurologic deficits.  No seizures or headaches.  She continues on chemotherapy well with Dr. DoLorenso Courier H+P (10/15/19) Patient presents to clinic to discuss recent neurologic symptoms and imaging findings.  She describes a fall while climbing stairs on 1/20; she hit the back of her and lost consciousness.  She is unclear whether she lost her balance or had mechanical provocation, but she had been drinking that evening.  She was treated initially as a trauma, CT demonstrated a lesion in the posterior fossa, later confirmed as a metastasis by MRI, along with 7 additional smaller lesions.  It took her almost two weeks to return for cognition and balance to return to "normal" which she feels she is about at right now.  Baseline she is high functioning and independent, works as a buNeurosurgeon Has been taking decadron [id number] per day.   Plans to initiate chemotherapy soon with Dr. DorLorenso CourierMedications: Current Outpatient Medications on File Prior to Visit  Medication Sig Dispense Refill   acetaminophen (TYLENOL) 500 MG tablet Take 1,000 mg by mouth every 8 (eight) hours as needed for moderate pain.     albuterol (VENTOLIN HFA) 108 (90 Base) MCG/ACT inhaler INHALE 2 PUFFS INTO THE LUNGS EVERY 6 HOURS AS NEEDED FOR WHEEZING OR SHORTNESS OF BREATH (Patient taking differently: Inhale 2 puffs into the lungs every 6 (six) hours as needed for shortness of breath or wheezing.) 6.7 g 11   ALPRAZolam (XANAX) 0.25 MG tablet Take 1 tablet (0.25 mg total) by mouth 2 (two) times daily as needed for anxiety 60 tablet 5   amLODipine (NORVASC) 10 MG tablet Take 1 tablet (10 mg total) by mouth daily. 30 tablet 3   Cyanocobalamin (VITAMIN B12 PO) Take 2 tablets by mouth daily. Gummies     cyclobenzaprine (FLEXERIL) 5 MG tablet Take 5 mg by mouth at bedtime.     dexamethasone (DECADRON) 1 MG tablet Take 2 tablets (2 mg total) by mouth daily. 60 tablet 1   folic acid (FOLVITE) 1 MG tablet Take 1 tablet by mouth daily. 90 tablet 1   furosemide (LASIX) 20 MG tablet Take 1 tablet by mouth daily. 90 tablet 1   lidocaine-prilocaine (EMLA) cream Apply to port as needed. 30 g 0   Multiple Vitamins-Minerals (AIRBORNE PO) Take 1 tablet by mouth daily.      ofloxacin (OCUFLOX) 0.3 % ophthalmic solution 2 drops each eye 4 times a day for 5 days (Patient taking differently:  Place 2 drops into both eyes daily as needed (irritation).) 5 mL 1   OLANZapine (ZYPREXA) 2.5 MG tablet TAKE 1 TABLET BY MOUTH EVERY NIGHT AT BEDTIME 90 tablet 1   ondansetron (ZOFRAN) 4 MG tablet TAKE 1 TO 2 TABLETS BY MOUTH 2 TIMES DAILY AS NEEDED 60 tablet 0   oxyCODONE (OXY IR/ROXICODONE) 5 MG immediate release tablet TAKE 1 TO 2 TABLETS BY MOUTH EVERY 4 HOURS AS NEEDED FOR SEVERE PAIN 90 tablet 0   pantoprazole (PROTONIX) 40 MG tablet TAKE ONE TABLET BY MOUTH DAILY (Patient taking  differently: Take 40 mg by mouth daily.) 90 tablet 3   pentoxifylline (TRENTAL) 400 MG CR tablet TAKE 1 TABLET BY MOUTH WITH FOOD FOR 1 WEEK THEN TAKE 1 TABLET BY MOUTH 2 TIMES DAILY THEREAFTER 60 tablet 2   polyethylene glycol (MIRALAX / GLYCOLAX) 17 g packet Take 17 g by mouth 2 (two) times daily.      potassium chloride SA (KLOR-CON M) 20 MEQ tablet Take 1 tablet by mouth daily. 30 tablet 2   prochlorperazine (COMPAZINE) 10 MG tablet Take 10 mg by mouth every 6 (six) hours as needed for nausea or vomiting.     promethazine (PHENERGAN) 12.5 MG tablet Take 1 to 2 tablets by mouth every 6 hours as needed for nausea 20 tablet 0   Vitamin D3 (VITAMIN D) 25 MCG tablet Take 2,000 Units by mouth daily.     vitamin E 180 MG (400 UNITS) capsule Take 400 IU daily x 1 week, then 400 IU BID (Patient taking differently: Take 400 Units by mouth in the morning and at bedtime. Take 400 IU daily x 1 week, then 400 IU BID) 60 capsule 4   XTAMPZA ER 13.5 MG C12A Take 1 capsule by mouth in the morning and at bedtime. (every 12 hours) 60 capsule 0   No current facility-administered medications on file prior to visit.    Allergies: No Known Allergies Past Medical History:  Past Medical History:  Diagnosis Date   Anemia    Anxiety    Concussion 09/28/2019   DVT (deep venous thrombosis) ([facility]) 2021   L leg   Dyspnea    GERD (gastroesophageal reflux disease)    Hypercholesterolemia    per pt, she does not have elevated lipids   Hypertension    met lung ca dx'd 09/2019   mets to spine, hip and brain   PONV (postoperative nausea and vomiting)    Tobacco abuse    Past Surgical History:  Past Surgical History:  Procedure Laterality Date   ABDOMINAL HYSTERECTOMY     partial/ left ovaries   APPLICATION OF CRANIAL NAVIGATION N/A 12/02/2020   Procedure: APPLICATION OF CRANIAL NAVIGATION;  Surgeon: Erline Levine, MD;  Location: [facility];  Service: Neurosurgery;  Laterality: N/A;   CHOLECYSTECTOMY     CRANIOTOMY  N/A 12/02/2020   Procedure: Posterior fossa craniotomy for tumor resection with brainlab;  Surgeon: Erline Levine, MD;  Location: [facility];  Service: Neurosurgery;  Laterality: N/A;   DILATION AND CURETTAGE OF UTERUS     IR IMAGING GUIDED PORT INSERTION  10/23/2019   KYPHOPLASTY N/A 03/15/2020   Procedure: Thoracic Eight KYPHOPLASTY;  Surgeon: Erline Levine, MD;  Location: [facility];  Service: Neurosurgery;  Laterality: N/A;  prone    LIPOMA EXCISION  2018   removed under left breast and right thigh.   TUBAL LIGATION     Social History:  Social History   Socioeconomic History   Marital status:  Married    Spouse name: Not on file   Number of children: 3   Years of education: Not on file   Highest education level: Not on file  Occupational History   Occupation: owns Academic librarian co    Employer: [employer]  Tobacco Use   Smoking status: Former    Packs/day: 0.25    Types: Cigarettes    Quit date: 10/31/2012    Years since quitting: 8.8   Smokeless tobacco: Never  Vaping Use   Vaping Use: Never used  Substance and Sexual Activity   Alcohol use: Yes    Alcohol/week: 0.0 standard drinks    Comment: rare   Drug use: No   Sexual activity: Yes  Other Topics Concern   Not on file  Social History Narrative   Not on file   Social Determinants of Health   Financial Resource Strain: Not on file  Food Insecurity: Not on file  Transportation Needs: Not on file  Physical Activity: Not on file  Stress: Not on file  Social Connections: Not on file  Intimate Partner Violence: Not on file   Family History:  Family History  Problem Relation Age of Onset   Heart disease Father    Drug abuse Daughter    Drug abuse Son    Cancer Sister    Heart disease Brother    Heart attack Brother     Review of Systems: Constitutional: Doesn't report fevers, chills or abnormal weight loss Eyes: Doesn't report blurriness of vision Ears, nose, mouth, throat, and face: Doesn't report sore  throat Respiratory: Doesn't report cough, dyspnea or wheezes Cardiovascular: Doesn't report palpitation, chest discomfort  Gastrointestinal:  Doesn't report nausea, constipation, diarrhea GU: Doesn't report incontinence Skin: Doesn't report skin rashes Neurological: Per HPI Musculoskeletal: Doesn't report joint pain Behavioral/Psych: Doesn't report anxiety  Physical Exam: Vitals:   08/29/21 1057  BP: (!) 129/92  Pulse: 98  Resp: 17  Temp: 98.1 F (36.7 C)  SpO2: 95%    KPS: 90. General: Alert, cooperative, pleasant, in no acute distress Head: Normal EENT: No conjunctival injection or scleral icterus.  Lungs: Resp effort normal Cardiac: Regular rate Abdomen: Non-distended abdomen Skin: No rashes cyanosis or petechiae. Extremities: No clubbing or edema  Neurologic Exam: Mental Status: Awake, alert, attentive to examiner. Oriented to self and environment. Language is fluent with intact comprehension.  Cranial Nerves: Visual acuity is grossly normal. Visual fields are full. Extra-ocular movements intact. No ptosis. Face is symmetric Motor: Tone and bulk are normal. Power is full in both arms and legs. Reflexes are symmetric, no pathologic reflexes present.  Sensory: Intact to light touch Gait: Moderate impairment to tandem gait only  Labs: I have reviewed the data as listed    Component Value Date/Time   NA 143 08/16/2021 1500   K 3.7 08/16/2021 1500   CL 107 08/16/2021 1500   CO2 25 08/16/2021 1500   GLUCOSE 195 (H) 08/16/2021 1500   BUN 10 08/16/2021 1500   CREATININE 0.76 08/16/2021 1500   CREATININE 0.58 11/15/2017 0948   CALCIUM 7.8 (L) 08/16/2021 1500   PROT 6.7 08/16/2021 1500   ALBUMIN 3.2 (L) 08/16/2021 1500   AST 19 08/16/2021 1500   ALT 21 08/16/2021 1500   ALKPHOS 54 08/16/2021 1500   BILITOT 0.4 08/16/2021 1500   GFRNONAA >60 08/16/2021 1500   GFRNONAA 100 11/15/2017 0948   GFRAA >60 06/09/2020 1051   GFRAA 116 11/15/2017 0948   Lab Results   Component Value Date  WBC 8.2 08/16/2021   NEUTROABS 6.6 08/16/2021   HGB 12.1 08/16/2021   HCT 35.7 (L) 08/16/2021   MCV 101.4 (H) 08/16/2021   PLT 215 08/16/2021    Imaging:  [facility] Clinician Interpretation: I have personally reviewed the CNS images as listed.  My interpretation, in the context of the patient's clinical presentation, is treatment effect vs true progression  MR BRAIN W WO CONTRAST  Result Date: 08/26/2021 CLINICAL DATA:  Lung cancer follow-up EXAM: MRI HEAD WITHOUT AND WITH CONTRAST TECHNIQUE: Multiplanar, multiecho pulse sequences of the brain and surrounding structures were obtained without and with intravenous contrast. CONTRAST:  29m MULTIHANCE GADOBENATE DIMEGLUMINE 529 MG/ML IV SOLN COMPARISON:  Brain MRI 05/25/2021 FINDINGS: Brain: Indistinct enhancement in the bilateral cerebellum and vermis, progressed in thickness and extent. Peripheral enhancement encompasses the operative cavity in the left cerebellum, which is also progressive. These changes are progressed on both the dark blood and MP rage sequences, compatible with true progression rather than differences in technique. Unchanged less extensive areas of restricted diffusion at the left middle cerebellar peduncle and cerebellum. Stable T2 hyperintensity and mild swelling without enhancement at the right olive; no focal brainstem abnormality to implicate hypertrophic olivary degeneration. Subcentimeter areas of irregular nodular enhancement in the cerebral hemispheres. These are all more conspicuous than prior, especially in the right parietal region where enhancement measures 5 mm. : 1. Right occipital lobe on 11:55 2. Right parietal lobe on 11:93 3. Left posterior frontal white matter on 11:116 4. Anterior left frontal cortex on 11:119. No incidental infarct, hemorrhage, hydrocephalus, or collection. Vascular: Major vessels are patent Skull and upper cervical spine: Left suboccipital craniotomy. Sinuses/Orbits:  Negative IMPRESSION: 1. Progressive ill-defined enhancement in the cerebellum and left middle cerebellar peduncle. 2. Mildly enlarged size of chronic supratentorial lesions but still subcentimeter and without mass effect. Electronically Signed   By: JJorje GuildM.D.   On: 08/26/2021 08:58     Assessment/Plan Brain metastases ([place]   Ms. Snedden is clinically stable today.  MRI brain demonstrates recurrence of prior enhancing foci within posterior fossa, similar to brain MRI preceding avastin rx.  She exhibits deficits consistent with posterior fossa dysfunction, which are unchanged from prior.  We suspect imaging artifact only, rather than clinically relevant or active disease process such as radionecrosis or organic tumor progression.  Could consider resuming avastin, but will hold off given clinical stability.  Ok to con't decadron 130mdaily as she clearly feels worsened when weaned down.  We appreciate the opportunity to participate in the care of Virginia [name]  We ask that PaKEIKO MYRICKSeturn to clinic in 2 months following next brain MRI, or sooner as needed given changes noted today.  All questions were answered. The patient knows to call the clinic with any problems, questions or concerns. No barriers to learning were detected.  The total time spent in the encounter was 30 minutes and more than 50% was on counseling and review of test results   ZaVentura SellersMD Medical Director of Neuro-Oncology [facility]2/20/22 10:34 AM

## 2021-08-30 ENCOUNTER — Telehealth: Payer: Self-pay | Admitting: Internal Medicine

## 2021-08-30 NOTE — Telephone Encounter (Signed)
Scheduled per 12/20 los, pt has been called and husband confirmed appt

## 2021-09-05 ENCOUNTER — Other Ambulatory Visit: Payer: Self-pay | Admitting: Internal Medicine

## 2021-09-05 ENCOUNTER — Telehealth: Payer: Self-pay | Admitting: *Deleted

## 2021-09-05 NOTE — Telephone Encounter (Signed)
Patients spouse [name] called to report that patient has had increased dizziness since visit with [facility] on 08/29/2021.  He stated that it was mentioned that patient might be able to restart Avastin if she became symptomatic and they are interested if Dr Mickeal Skinner is agreeable since her dizziness has worsened.  Routing to MD to advise.  If he is agreeable then will need to lengthen the infusion appointment time.

## 2021-09-07 ENCOUNTER — Ambulatory Visit: Payer: BC Managed Care – PPO

## 2021-09-07 ENCOUNTER — Other Ambulatory Visit: Payer: BC Managed Care – PPO

## 2021-09-08 ENCOUNTER — Ambulatory Visit: Payer: BC Managed Care – PPO | Admitting: Physician Assistant

## 2021-09-08 ENCOUNTER — Inpatient Hospital Stay: Payer: BC Managed Care – PPO

## 2021-09-08 ENCOUNTER — Other Ambulatory Visit: Payer: Self-pay | Admitting: Hematology

## 2021-09-08 ENCOUNTER — Inpatient Hospital Stay (HOSPITAL_BASED_OUTPATIENT_CLINIC_OR_DEPARTMENT_OTHER): Payer: BC Managed Care – PPO | Admitting: Physician Assistant

## 2021-09-08 ENCOUNTER — Other Ambulatory Visit: Payer: Self-pay

## 2021-09-08 ENCOUNTER — Other Ambulatory Visit: Payer: BC Managed Care – PPO

## 2021-09-08 ENCOUNTER — Other Ambulatory Visit: Payer: Self-pay | Admitting: Hematology and Oncology

## 2021-09-08 VITALS — BP 130/87 | HR 98 | Temp 98.1°F | Resp 18 | Wt 163.0 lb

## 2021-09-08 DIAGNOSIS — R42 Dizziness and giddiness: Secondary | ICD-10-CM | POA: Diagnosis not present

## 2021-09-08 DIAGNOSIS — C7931 Secondary malignant neoplasm of brain: Secondary | ICD-10-CM

## 2021-09-08 DIAGNOSIS — C778 Secondary and unspecified malignant neoplasm of lymph nodes of multiple regions: Secondary | ICD-10-CM | POA: Diagnosis not present

## 2021-09-08 DIAGNOSIS — C3431 Malignant neoplasm of lower lobe, right bronchus or lung: Secondary | ICD-10-CM | POA: Diagnosis not present

## 2021-09-08 DIAGNOSIS — C7951 Secondary malignant neoplasm of bone: Secondary | ICD-10-CM

## 2021-09-08 DIAGNOSIS — Z7901 Long term (current) use of anticoagulants: Secondary | ICD-10-CM | POA: Diagnosis not present

## 2021-09-08 DIAGNOSIS — Z5111 Encounter for antineoplastic chemotherapy: Secondary | ICD-10-CM | POA: Diagnosis not present

## 2021-09-08 DIAGNOSIS — Z923 Personal history of irradiation: Secondary | ICD-10-CM | POA: Diagnosis not present

## 2021-09-08 DIAGNOSIS — C3490 Malignant neoplasm of unspecified part of unspecified bronchus or lung: Secondary | ICD-10-CM | POA: Diagnosis not present

## 2021-09-08 DIAGNOSIS — I6789 Other cerebrovascular disease: Secondary | ICD-10-CM

## 2021-09-08 DIAGNOSIS — Z95828 Presence of other vascular implants and grafts: Secondary | ICD-10-CM

## 2021-09-08 DIAGNOSIS — Z7952 Long term (current) use of systemic steroids: Secondary | ICD-10-CM | POA: Diagnosis not present

## 2021-09-08 DIAGNOSIS — E876 Hypokalemia: Secondary | ICD-10-CM | POA: Diagnosis not present

## 2021-09-08 DIAGNOSIS — Z87891 Personal history of nicotine dependence: Secondary | ICD-10-CM | POA: Diagnosis not present

## 2021-09-08 DIAGNOSIS — Z5112 Encounter for antineoplastic immunotherapy: Secondary | ICD-10-CM | POA: Diagnosis not present

## 2021-09-08 DIAGNOSIS — Z86718 Personal history of other venous thrombosis and embolism: Secondary | ICD-10-CM | POA: Diagnosis not present

## 2021-09-08 DIAGNOSIS — R918 Other nonspecific abnormal finding of lung field: Secondary | ICD-10-CM

## 2021-09-08 DIAGNOSIS — I10 Essential (primary) hypertension: Secondary | ICD-10-CM | POA: Diagnosis not present

## 2021-09-08 DIAGNOSIS — R112 Nausea with vomiting, unspecified: Secondary | ICD-10-CM | POA: Diagnosis not present

## 2021-09-08 DIAGNOSIS — Z79899 Other long term (current) drug therapy: Secondary | ICD-10-CM | POA: Diagnosis not present

## 2021-09-08 LAB — CMP (CANCER CENTER ONLY)
ALT: 19 U/L (ref 0–44)
AST: 15 U/L (ref 15–41)
Albumin: 3.6 g/dL (ref 3.5–5.0)
Alkaline Phosphatase: 50 U/L (ref 38–126)
Anion gap: 8 (ref 5–15)
BUN: 20 mg/dL (ref 8–23)
CO2: 30 mmol/L (ref 22–32)
Calcium: 9 mg/dL (ref 8.9–10.3)
Chloride: 101 mmol/L (ref 98–111)
Creatinine: 0.9 mg/dL (ref 0.44–1.00)
GFR, Estimated: >60 mL/min (ref >60)
Glucose, Bld: 100 mg/dL — ABNORMAL HIGH (ref 70–99)
Potassium: 3 mmol/L — ABNORMAL LOW (ref 3.5–5.1)
Sodium: 139 mmol/L (ref 135–145)
Total Bilirubin: 0.4 mg/dL (ref 0.3–1.2)
Total Protein: 6.8 g/dL (ref 6.5–8.1)

## 2021-09-08 LAB — CBC WITH DIFFERENTIAL (CANCER CENTER ONLY)
Abs Immature Granulocytes: 0.12 10*3/uL — ABNORMAL HIGH (ref 0.00–0.07)
Basophils Absolute: 0.1 10*3/uL (ref 0.0–0.1)
Basophils Relative: 1 %
Eosinophils Absolute: 0.1 10*3/uL (ref 0.0–0.5)
Eosinophils Relative: 1 %
HCT: 35.1 % — ABNORMAL LOW (ref 36.0–46.0)
Hemoglobin: 12.6 g/dL (ref 12.0–15.0)
Immature Granulocytes: 1 %
Lymphocytes Relative: 20 %
Lymphs Abs: 2.1 10*3/uL (ref 0.7–4.0)
MCH: 35.6 pg — ABNORMAL HIGH (ref 26.0–34.0)
MCHC: 35.9 g/dL (ref 30.0–36.0)
MCV: 99.2 fL (ref 80.0–100.0)
Monocytes Absolute: 1.5 10*3/uL — ABNORMAL HIGH (ref 0.1–1.0)
Monocytes Relative: 14 %
Neutro Abs: 6.7 10*3/uL (ref 1.7–7.7)
Neutrophils Relative %: 63 %
Platelet Count: 350 10*3/uL (ref 150–400)
RBC: 3.54 MIL/uL — ABNORMAL LOW (ref 3.87–5.11)
RDW: 13.9 % (ref 11.5–15.5)
WBC Count: 10.5 10*3/uL (ref 4.0–10.5)
nRBC: 0 % (ref 0.0–0.2)

## 2021-09-08 LAB — URINALYSIS, DIPSTICK ONLY
Bilirubin Urine: NEGATIVE
Glucose, UA: NEGATIVE mg/dL
Hgb urine dipstick: NEGATIVE
Ketones, ur: NEGATIVE mg/dL
Leukocytes,Ua: NEGATIVE
Nitrite: NEGATIVE
Protein, ur: NEGATIVE mg/dL
Specific Gravity, Urine: 1.005 (ref 1.005–1.030)
pH: 7 (ref 5.0–8.0)

## 2021-09-08 LAB — TOTAL PROTEIN, URINE DIPSTICK: Protein, ur: NEGATIVE mg/dL

## 2021-09-08 LAB — TSH: TSH: 1.196 u[IU]/mL (ref 0.308–3.960)

## 2021-09-08 LAB — LACTATE DEHYDROGENASE: LDH: 194 U/L — ABNORMAL HIGH (ref 98–192)

## 2021-09-08 MED ORDER — CYANOCOBALAMIN 1000 MCG/ML IJ SOLN
1000.0000 ug | Freq: Once | INTRAMUSCULAR | Status: AC
  Administered 2021-09-08: 12:00:00 1000 ug via INTRAMUSCULAR
  Filled 2021-09-08: qty 1

## 2021-09-08 MED ORDER — SODIUM CHLORIDE 0.9 % IV SOLN
10.0000 mg/kg | Freq: Once | INTRAVENOUS | Status: AC
  Administered 2021-09-08: 13:00:00 800 mg via INTRAVENOUS
  Filled 2021-09-08: qty 32

## 2021-09-08 MED ORDER — SODIUM CHLORIDE 0.9 % IV SOLN
Freq: Once | INTRAVENOUS | Status: AC
Start: 2021-09-08 — End: 2021-09-08

## 2021-09-08 MED ORDER — SODIUM CHLORIDE 0.9% FLUSH
10.0000 mL | INTRAVENOUS | Status: DC | PRN
  Administered 2021-09-08: 10:00:00 10 mL via INTRAVENOUS

## 2021-09-08 MED ORDER — SODIUM CHLORIDE 0.9 % IV SOLN
500.0000 mg/m2 | Freq: Once | INTRAVENOUS | Status: AC
  Administered 2021-09-08: 13:00:00 900 mg via INTRAVENOUS
  Filled 2021-09-08: qty 20

## 2021-09-08 MED ORDER — SODIUM CHLORIDE 0.9% FLUSH
10.0000 mL | INTRAVENOUS | Status: DC | PRN
  Administered 2021-09-08: 13:00:00 10 mL

## 2021-09-08 MED ORDER — SODIUM CHLORIDE 0.9 % IV SOLN: Freq: Once | INTRAVENOUS | Status: AC

## 2021-09-08 MED ORDER — HEPARIN SOD (PORK) LOCK FLUSH 100 UNIT/ML IV SOLN
500.0000 [IU] | Freq: Once | INTRAVENOUS | Status: AC | PRN
  Administered 2021-09-08: 13:00:00 500 [IU]

## 2021-09-08 MED ORDER — PROCHLORPERAZINE MALEATE 10 MG PO TABS
10.0000 mg | ORAL_TABLET | Freq: Once | ORAL | Status: AC
  Administered 2021-09-08: 11:00:00 10 mg via ORAL
  Filled 2021-09-08: qty 1

## 2021-09-08 MED ORDER — SODIUM CHLORIDE 0.9 % IV SOLN
200.0000 mg | Freq: Once | INTRAVENOUS | Status: AC
  Administered 2021-09-08: 12:00:00 200 mg via INTRAVENOUS
  Filled 2021-09-08: qty 8

## 2021-09-08 NOTE — Patient Instructions (Signed)
[facility]  Discharge Instructions: Thank you for choosing [facility] to provide your oncology and hematology care.   If you have a lab appointment with the [facility], please go directly to the [facility] and check in at the registration area.   Wear comfortable clothing and clothing appropriate for easy access to any Portacath or PICC line.   We strive to give you quality time with your provider. You may need to reschedule your appointment if you arrive late (15 or more minutes).  Arriving late affects you and other patients whose appointments are after yours.  Also, if you miss three or more appointments without notifying the office, you may be dismissed from the clinic at the providers discretion.      For prescription refill requests, have your pharmacy contact our office and allow 72 hours for refills to be completed.    Today you received the following chemotherapy and/or immunotherapy agents: Pemetrexed, Pembrolizumab, Bevacizumab.      To help prevent nausea and vomiting after your treatment, we encourage you to take your nausea medication as directed.  BELOW ARE SYMPTOMS THAT SHOULD BE REPORTED IMMEDIATELY: *FEVER GREATER THAN 100.4 F (38 C) OR HIGHER *CHILLS OR SWEATING *NAUSEA AND VOMITING THAT IS NOT CONTROLLED WITH YOUR NAUSEA MEDICATION *UNUSUAL SHORTNESS OF BREATH *UNUSUAL BRUISING OR BLEEDING *URINARY PROBLEMS (pain or burning when urinating, or frequent urination) *BOWEL PROBLEMS (unusual diarrhea, constipation, pain near the anus) TENDERNESS IN MOUTH AND THROAT WITH OR WITHOUT PRESENCE OF ULCERS (sore throat, sores in mouth, or a toothache) UNUSUAL RASH, SWELLING OR PAIN  UNUSUAL VAGINAL DISCHARGE OR ITCHING   Items with * indicate a potential emergency and should be followed up as soon as possible or go to the Emergency Department if any problems should occur.  Please show the CHEMOTHERAPY ALERT CARD or  IMMUNOTHERAPY ALERT CARD at check-in to the Emergency Department and triage nurse.  Should you have questions after your visit or need to cancel or reschedule your appointment, please contact [facility]  Dept: [phone]  and follow the prompts.  Office hours are 8:00 a.m. to 4:30 p.m. Monday - Friday. Please note that voicemails left after 4:00 p.m. may not be returned until the following business day.  We are closed weekends and major holidays. You have access to a nurse at all times for urgent questions. Please call the main number to the clinic Dept: [phone] and follow the prompts.   For any non-urgent questions, you may also contact your provider using MyChart. We now offer e-Visits for anyone 64 and older to request care online for non-urgent symptoms. For details visit [email or web address].   Also download the MyChart app! Go to the app store, search "MyChart", open the app, select [facility], and log in with your MyChart username and password.  Due to Covid, a mask is required upon entering the hospital/clinic. If you do not have a mask, one will be given to you upon arrival. For doctor visits, patients may have 1 support person aged 64 or older with them. For treatment visits, patients cannot have anyone with them due to current Covid guidelines and our immunocompromised population.

## 2021-09-11 ENCOUNTER — Encounter: Payer: Self-pay | Admitting: Internal Medicine

## 2021-09-11 ENCOUNTER — Encounter: Payer: Self-pay | Admitting: Hematology and Oncology

## 2021-09-11 ENCOUNTER — Other Ambulatory Visit: Payer: Self-pay | Admitting: Internal Medicine

## 2021-09-11 ENCOUNTER — Other Ambulatory Visit (HOSPITAL_COMMUNITY): Payer: Self-pay

## 2021-09-11 NOTE — Progress Notes (Signed)
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 CT of the head showed showed concerning for metastatic disease involving the left cerebellum and right occipital lobe.  2) 09/30/2019: MRI brain confirms mass within the left cerebellum measures 1.6 x 1.3 x 1.5 cm as well as at least 8 metastatic lesions within the supratentorial and infratentorial brain as outlined 3) 10/01/2019: PET CT scan revealed hypermetabolic infrahilar right lower lobe mass with hypermetabolic nodal metastases in the right hilum, mediastinum, right supraclavicular region, left axilla and lower left neck. 4) 10/08/2019: US guided biopsy of the lymph nodes confirms poorly differentiated non-small cell carcinoma. Immunohistochemistry confirms adenocarcinoma of lung primary. PD-L1 and NGS later revealed a MET Exon 14 mutation and TPS of 90%.  5) 10/12/2019: Establish care with Dr. Lorenso Courier  6) 11/04/2019: Day 1 of capmatinib $RemoveBefor'400mg'NBdaqBOnNtUZ$  BID 7) 11/09/2019: presented to Rad/Onc with a DVT in LLE. Seen in symptom management clinic and started on apixaban.  8) 12/29/2019: CT C/A/P showed interval decrease in size of mass involving the superior segment of right lower lobe and reduction in mediastinal and left supraclavicular adenopathy though some small spinal lesions were noted in the interim between the two sets of scans 9) 01/25/2020: temporarily stopped capmatinib due to symptoms of nausea/fatigue. Restarted therapy on 01/30/2020 after brief chemo holiday.  10) 03/22/2020: CT C/A/P reveals a mixed picture, with new lung nodule and  increased lymphadenopathy, however response in bone lesions. D/c capmatinib therapy 11) 04/08/2020: start of Carbo/Pem/Pem for 2nd line treatment. Cycle 1 Day 1   12) 04/29/2020: Cycle 2 Day 1 Carbo/Pem/Pem  13) 05/20/2020: Cycle 3 Day 1 Carbo/Pem/Pem  14) 06/09/2020: Cycle 4 Day 1 Carbo/Pem/Pem  15) 06/29/2020: Cycle 5 Day 1 maintenance Pem/Pem  16) 07/20/2020: Cycle 6 Day 1 maintenance Pem/Pem  17) 08/12/2020: Cycle 7 Day 1 maintenance Pem/Pem  18) 09/01/2020: Cycle 8 Day 1 maintenance Pem/Pem  19) 09/21/2020: Cycle 9 Day 1 maintenance Pem/Pem  20) 10/13/2020: Cycle 10 Day 1 maintenance Pem/Pem  21) 11/03/2020: Cycle 11 Day 1 maintenance Pem/Pem  22) 11/25/2020:  Cycle 12 Day 1 maintenance Pem/Pem 23) 12/15/2020:  Cycle 13 Day 1 maintenance Pem/Pem 24) 01/05/2021:  Cycle 14 Day 1 maintenance Pem/Pem 25) 01/26/2021:  Cycle 15 Day 1 maintenance Pem/Pem plus Bevacizumab.  02/17/2021: Cycle 16 Day 1 maintenance Pem/Pem plus Bevacizumab.  03/10/2021: Cycle 17 Day 1 maintenance Pem/Pem plus Bevacizumab.  03/31/2021: Cycle 18 Day 1 maintenance Pem/Pem  04/20/2021:  Cycle 19 Day 1 maintenance Pem/Pem  05/12/2021: Cycle 20 Day 1 maintenance Pem/Pem 06/01/2021: Cycle 21 Day 1 maintenance Pem/Pem 06/22/2021: Cycle 22 Day 1 maintenance Pem/Pem 07/13/2021: Cycle 23 Day 1 maintenance Pem/Pem 08/16/2021: Cycle 24 Day 1 maintenance Pem/Pem 09/08/2021: Cycle 25 Day 1 maintenance Pem/Pem plus Bevacizumab  Interval History:  Virginia [name] 66 y.o. female with medical history significant for metastatic adenocarcinoma of the lung presents for a follow up visit. The patient's last visit was on 08/16/2021.  In the interim since the last visit, she has continued pem/pem maintenance. Additionally, he was evaluated by Dr. Mickeal Skinner on 08/29/21 with recommendations to resume Bevacizumab since her dizziness has worsened.  Today is Cycle 25 Day 1.   On exam today Virginia [name] is accompanied by her husband.  She reports that her dizziness  has worsened with some affect on her balance. She denies any recent falls due to her dizziness. She reports that her energy levels and appetite are stable. She denies any nausea, vomiting or abdominal pain. She denies easy bruising or signs of bleeding. She has shortness of breath with exertion that is her baseline. She denies any fevers, chills, night sweats, chest pain, cough, rash or neuropathy. She has no other complaints. Rest of the 10 point ROS is below.    MEDICAL HISTORY:  Past Medical History:  Diagnosis Date   Anemia    Anxiety    Concussion 09/28/2019   DVT (deep venous thrombosis) ([place]) 2021   L leg   Dyspnea    GERD (gastroesophageal reflux disease)    Hypercholesterolemia    per pt, she does not have elevated lipids   Hypertension    met lung ca dx'd 09/2019   mets to spine, hip and brain   PONV (postoperative nausea and vomiting)    Tobacco abuse     SURGICAL HISTORY: Past Surgical History:  Procedure Laterality Date   ABDOMINAL HYSTERECTOMY     partial/ left ovaries   APPLICATION OF CRANIAL NAVIGATION N/A 12/02/2020   Procedure: APPLICATION OF CRANIAL NAVIGATION;  Surgeon: Erline Levine, MD;  Location: [place];  Service: Neurosurgery;  Laterality: N/A;   CHOLECYSTECTOMY     CRANIOTOMY N/A 12/02/2020   Procedure: Posterior fossa craniotomy for tumor resection with brainlab;  Surgeon: Erline Levine, MD;  Location: [place];  Service: Neurosurgery;  Laterality: N/A;   DILATION AND CURETTAGE OF UTERUS     IR IMAGING GUIDED PORT INSERTION  10/23/2019   KYPHOPLASTY N/A 03/15/2020   Procedure: Thoracic Eight KYPHOPLASTY;  Surgeon: Erline Levine, MD;  Location: [place];  Service: Neurosurgery;  Laterality: N/A;  prone    LIPOMA EXCISION  2018   removed under left breast and right thigh.   TUBAL LIGATION      ALLERGIES:  has No Known Allergies.  MEDICATIONS:  Current Outpatient Medications  Medication Sig Dispense Refill   acetaminophen (TYLENOL) 500 MG tablet Take 1,000 mg  by mouth every 8 (eight) hours as needed for moderate pain.     albuterol (VENTOLIN HFA) 108 (90 Base) MCG/ACT inhaler INHALE 2 PUFFS INTO THE LUNGS EVERY 6 HOURS AS NEEDED FOR WHEEZING OR SHORTNESS OF BREATH (Patient taking differently: Inhale 2 puffs into the lungs every 6 (six) hours as needed for shortness of breath or wheezing.) 6.7 g 11   ALPRAZolam (XANAX) 0.25 MG tablet Take 1 tablet (0.25 mg total) by mouth 2 (two) times daily as needed for anxiety 60 tablet 5   amLODipine (NORVASC) 10 MG tablet Take 1 tablet (10 mg total) by mouth daily. 30 tablet 3   Cyanocobalamin (VITAMIN B12 PO) Take 2 tablets by mouth daily. Gummies     cyclobenzaprine (FLEXERIL) 5 MG tablet Take 5 mg by mouth at bedtime.     dexamethasone (DECADRON) 1 MG tablet Take 2 tablets (2 mg total) by mouth daily. 60 tablet 1   folic acid (FOLVITE) 1 MG tablet Take 1 tablet by mouth daily. 90 tablet 1   furosemide (LASIX) 20 MG tablet Take 1 tablet by mouth daily. 90 tablet 1   lidocaine-prilocaine (EMLA) cream Apply to port as needed. 30 g 0   Multiple Vitamins-Minerals (AIRBORNE PO) Take 1 tablet  by mouth daily.      ofloxacin (OCUFLOX) 0.3 % ophthalmic solution 2 drops each eye 4 times a day for 5 days (Patient taking differently: Place 2 drops into both eyes daily as needed (irritation).) 5 mL 1   OLANZapine (ZYPREXA) 2.5 MG tablet TAKE 1 TABLET BY MOUTH EVERY NIGHT AT BEDTIME 90 tablet 1   ondansetron (ZOFRAN) 4 MG tablet TAKE 1 TO 2 TABLETS BY MOUTH 2 TIMES DAILY AS NEEDED 60 tablet 0   oxyCODONE (OXY IR/ROXICODONE) 5 MG immediate release tablet TAKE 1 TO 2 TABLETS BY MOUTH EVERY 4 HOURS AS NEEDED FOR SEVERE PAIN 90 tablet 0   pantoprazole (PROTONIX) 40 MG tablet TAKE ONE TABLET BY MOUTH DAILY (Patient taking differently: Take 40 mg by mouth daily.) 90 tablet 3   pentoxifylline (TRENTAL) 400 MG CR tablet TAKE 1 TABLET BY MOUTH WITH FOOD FOR 1 WEEK THEN TAKE 1 TABLET BY MOUTH 2 TIMES DAILY THEREAFTER 60 tablet 2    polyethylene glycol (MIRALAX / GLYCOLAX) 17 g packet Take 17 g by mouth 2 (two) times daily.      potassium chloride SA (KLOR-CON M) 20 MEQ tablet Take 1 tablet by mouth daily. 30 tablet 2   prochlorperazine (COMPAZINE) 10 MG tablet Take 10 mg by mouth every 6 (six) hours as needed for nausea or vomiting.     promethazine (PHENERGAN) 12.5 MG tablet Take 1 to 2 tablets by mouth every 6 hours as needed for nausea 20 tablet 0   Vitamin D3 (VITAMIN D) 25 MCG tablet Take 2,000 Units by mouth daily.     vitamin E 180 MG (400 UNITS) capsule Take 400 IU daily x 1 week, then 400 IU BID (Patient taking differently: Take 400 Units by mouth in the morning and at bedtime. Take 400 IU daily x 1 week, then 400 IU BID) 60 capsule 4   XTAMPZA ER 13.5 MG C12A Take 1 capsule by mouth in the morning and at bedtime. (every 12 hours) 60 capsule 0   No current facility-administered medications for this visit.    REVIEW OF SYSTEMS:   Constitutional: ( - ) fevers, ( - )  chills , ( - ) night sweats Eyes: ( - ) blurriness of vision, ( - ) double vision, ( - ) watery eyes Ears, nose, mouth, throat, and face: ( - ) mucositis, ( - ) sore throat Respiratory: ( - ) cough, ( - ) dyspnea, ( - ) wheezes Cardiovascular: ( - ) palpitation, ( - ) chest discomfort, ( - ) lower extremity swelling Gastrointestinal:  ( - ) nausea, ( - ) heartburn, ( - ) change in bowel habits Skin: ( - ) abnormal skin rashes Lymphatics: ( - ) new lymphadenopathy, ( - ) easy bruising Neurological: ( - ) numbness, ( - ) tingling, ( - ) new weaknesses Behavioral/Psych: ( - ) mood change, ( - ) new changes  All other systems were reviewed with the patient and are negative.  PHYSICAL EXAMINATION: ECOG PERFORMANCE STATUS: 1 - Symptomatic but completely ambulatory  There were no vitals filed for this visit.  There were no vitals filed for this visit.   GENERAL: well appearing middle aged Caucasian female in NAD  SKIN: skin color, texture, turgor  are normal, no rashes or significant lesions EYES: conjunctiva are pink and non-injected, sclera clear LUNGS: wheezing at lung bases. clear to auscultation and percussion with normal breathing effort HEART: regular rate & rhythm and no murmurs and bilateral + 2  pitting lower extremity edema Musculoskeletal: no cyanosis of digits and no clubbing PSYCH: alert & oriented x 3, fluent speech NEURO: no focal motor/sensory deficits    LABORATORY DATA:  I have reviewed the data as listed CBC Latest Ref Rng & Units 09/08/2021 08/16/2021 07/13/2021  WBC 4.0 - 10.5 K/uL 10.5 8.2 8.3  Hemoglobin 12.0 - 15.0 g/dL 12.6 12.1 12.7  Hematocrit 36.0 - 46.0 % 35.1(L) 35.7(L) 36.8  Platelets 150 - 400 K/uL 350 215 343    CMP Latest Ref Rng & Units 09/08/2021 08/16/2021 07/13/2021  Glucose 70 - 99 mg/dL 100(H) 195(H) 104(H)  BUN 8 - 23 mg/dL $Remove'20 10 18  'vYWHtAZ$ Creatinine 0.44 - 1.00 mg/dL 0.90 0.76 0.70  Sodium 135 - 145 mmol/L 139 143 142  Potassium 3.5 - 5.1 mmol/L 3.0(L) 3.7 3.3(L)  Chloride 98 - 111 mmol/L 101 107 104  CO2 22 - 32 mmol/L $RemoveB'30 25 28  'rJogfuUe$ Calcium 8.9 - 10.3 mg/dL 9.0 7.8(L) 8.8(L)  Total Protein 6.5 - 8.1 g/dL 6.8 6.7 6.8  Total Bilirubin 0.3 - 1.2 mg/dL 0.4 0.4 0.4  Alkaline Phos 38 - 126 U/L 50 54 62  AST 15 - 41 U/L $Remo'15 19 15  'pbZHC$ ALT 0 - 44 U/L $Remo'19 21 19     'YEWqZ$ RADIOGRAPHIC STUDIES: I personally have viewed the radiographic studies below: MR BRAIN W WO CONTRAST  Result Date: 08/26/2021 CLINICAL DATA:  Lung cancer follow-up EXAM: MRI HEAD WITHOUT AND WITH CONTRAST TECHNIQUE: Multiplanar, multiecho pulse sequences of the brain and surrounding structures were obtained without and with intravenous contrast. CONTRAST:  31mL MULTIHANCE GADOBENATE DIMEGLUMINE 529 MG/ML IV SOLN COMPARISON:  Brain MRI 05/25/2021 FINDINGS: Brain: Indistinct enhancement in the bilateral cerebellum and vermis, progressed in thickness and extent. Peripheral enhancement encompasses the operative cavity in the left cerebellum, which  is also progressive. These changes are progressed on both the dark blood and MP rage sequences, compatible with true progression rather than differences in technique. Unchanged less extensive areas of restricted diffusion at the left middle cerebellar peduncle and cerebellum. Stable T2 hyperintensity and mild swelling without enhancement at the right olive; no focal brainstem abnormality to implicate hypertrophic olivary degeneration. Subcentimeter areas of irregular nodular enhancement in the cerebral hemispheres. These are all more conspicuous than prior, especially in the right parietal region where enhancement measures 5 mm. : 1. Right occipital lobe on 11:55 2. Right parietal lobe on 11:93 3. Left posterior frontal white matter on 11:116 4. Anterior left frontal cortex on 11:119. No incidental infarct, hemorrhage, hydrocephalus, or collection. Vascular: Major vessels are patent Skull and upper cervical spine: Left suboccipital craniotomy. Sinuses/Orbits: Negative IMPRESSION: 1. Progressive ill-defined enhancement in the cerebellum and left middle cerebellar peduncle. 2. Mildly enlarged size of chronic supratentorial lesions but still subcentimeter and without mass effect. Electronically Signed   By: Jorje Guild M.D.   On: 08/26/2021 08:58     ASSESSMENT & PLAN LAVORA BRISBON 66 y.o. female with medical history significant for metastatic adenocarcinoma of the lung presents for a follow up visit. 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   Previously we discussed Carboplatin/Pembrolizumab/Pemetrexed and the expected side effect from these medications.  We discussed the possible side effects of immunotherapy including colitis, hepatitis, dermatitis, and pneumonitis.  Additionally we discussed the side effects of carboplatin which include suppression of blood counts, nephrotoxicity, and nausea.  Additionally we discussed pemetrexed which can also have effects on  counts and would require  supplementation with vitamin S28 and folic acid.  She voiced her understanding of these side effects and treatment plans moving forward.  I also noted that we would be able to drop the chemotherapy agents that she had intolerance to continue with pembrolizumab alone given her high TPS score.  The patient has a markedly elevated TPS score (90%)   Mrs. Pechacek continues to tolerate the treatment. Her labs from today were reviewed without any intervention needed. Patient will proceed with treatment as planned and return to the clinic in 3 weeks prior to her next cycle.   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 Due to worsening dizziness, she will resume today with Cycle 25.  --patient will be followed every 3 weeks with labs and clinic visit --scheduled for repeat CT CAP on 09/18/2021. --supportive therapy as listed below.  --RTC q 3 weeks for continued monitoring/chemotherapy treatments.    #Nausea/Vomiting, improving -- provided patient with Zofran 4-8mg  q8h PRN and compazine 10mg  q6H for breakthrough PRN.  She is currently using Phenergan for breakthrough. --continue olanzpaine 2.5 mg PO QHS to help with nausea. Marked improvement since the addition of this medication on 01/29/2020.  --continue to monitor   #Hypokalemia, stable --likely 2/2 to decreased PO intake, hypovolemia, and BP medications --Potassium level 3.0 today.   --Currently taking 20 mEq PO potassium daily. Recommend to increase to twice daily   #Pain Control, stable --continue oxycodone 5mg  q4H PRN. Can increase dose to 10mg  q6H if pain is severe. She is taking approximately 2-3 pills per day.  --continue Xtampza ER BID for long acting support.  --continue to take with Senokot to prevent opioid induced constipation. Miralax PRN    #Supportive Therapy --continue EMLA cream with port --zometa 4g IV q 12 weeks for bone metastasis, dental clearance received.  --nausea as above    #Brain Metastasis, stable --appreciate the assistance of Dr. 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 --radiation therapy to the brain completed on 11/16/2019.  --MRI brain on 04/29/2020 showed evidence of small brain bleed. Eliquis therapy was discontinued.  --MRI on 11/04/2020 showed concern for progression of intracranial disease.  --On 12/02/2020, patient underwent craniotomy, resection of progressive cerebellar mass by Dr. Vertell Limber. 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  --MRI on 12/23/2020 showed evidence of radiation necrosis. Dr. Mickeal Skinner evaluated the patient on 01/02/2021 with recommendations to add IV Bevacizumab 10 mg/kg q 3 weeks ( started 01/26/2021). Completed a full course of this therapy. --05/26/2021: MRI brain showed no substantial changes.  --Patient was evaluated by Dr. Mickeal Skinner on 08/29/2021 and recommended to resume Bevacizumab due to worsening dizziness.   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    # Hypertension: --BP at today's visit was 130/87. Continue Norvasc to 10 mg daily.  --continue to monitor at home and in clinic.    No orders of the defined types were placed in this encounter.   All questions were answered. The patient knows to call the clinic with any problems, questions or concerns.  I have spent a total of 30 minutes minutes of face-to-face and non-face-to-face time, preparing to see the patient, performing a medically appropriate examination, counseling and educating the patient,  documenting clinical information in the electronic  health record, and care coordination.    Lincoln Brigham, [name] Department of Hematology/Oncology [facility] at  [facility] Phone: [phone]    09/11/2021 11:22 AM   Literature Support:  Lorenza Chick, Clearence Cheek, Duffy Rhody, Felip E, [place] F, Domine M, [place], Hochmair MJ, [place], [place], Bischoff HG, [place], Grossi F, [place], Reck M, [place], [place], [place], Rubio-Viqueira B, Novello S, Kurata T, Gray JE, Vida J, Wei Z, Yang J, Raftopoulos H, [place], [place] [employer]; [id number] Investigators. Pembrolizumab plus Chemotherapy in Metastatic Non-Small-Cell Lung Cancer. [employer] Med. 2018 May 31;378(22):2078-2092.  --Median progression-free survival was 8.8 months (95% CI, 7.6 to 9.2) in the pembrolizumab-combination group and 4.9 months (95% CI, 4.7 to 5.5) in the placebo-combination group (hazard ratio for disease progression or death, 0.52; 95% CI, 0.43 to 0.64; P<0.001). Adverse events of grade 3 or higher occurred in 67.2% of the patients in the pembrolizumab-combination group and in 65.8% of those in the placebo-combination group.  Jodene Nam L. Efficacy of pemetrexed-based regimens in advanced non-small cell lung cancer patients with activating epidermal growth factor receptor mutations after tyrosine kinase inhibitor failure: a systematic review. Onco Targets Ther. 2018;11:2121-2129.   --The weighted median PFS, median OS, and ORR for patients treated with pem regimens were 5.09 months, 15.91 months, and 30.19%, respectively. Our systematic review results showed a favorable efficacy profile of pem regimens in NSCLC patients with EGFR mutation after EGFR-TKI failure.

## 2021-09-12 ENCOUNTER — Other Ambulatory Visit (HOSPITAL_COMMUNITY): Payer: Self-pay

## 2021-09-12 ENCOUNTER — Other Ambulatory Visit: Payer: Self-pay | Admitting: Physician Assistant

## 2021-09-12 ENCOUNTER — Telehealth: Payer: Self-pay

## 2021-09-12 MED ORDER — DEXAMETHASONE 1 MG PO TABS
2.0000 mg | ORAL_TABLET | Freq: Every day | ORAL | 1 refills | Status: DC
  Filled 2021-09-12: qty 60, 30d supply, fill #0

## 2021-09-12 MED ORDER — POTASSIUM CHLORIDE CRYS ER 20 MEQ PO TBCR
20.0000 meq | EXTENDED_RELEASE_TABLET | Freq: Two times a day (BID) | ORAL | 2 refills | Status: DC
  Filled 2021-09-12 – 2021-09-13 (×2): qty 60, 30d supply, fill #0

## 2021-09-12 NOTE — Telephone Encounter (Signed)
Please call patient and let her know to increase potassium supplement she takes from once a day to twice a day  Pt's husband, [name], advised. She does not have enough left on her prescription and he requested a new prescription be sent to [facility].

## 2021-09-13 ENCOUNTER — Other Ambulatory Visit (HOSPITAL_COMMUNITY): Payer: Self-pay

## 2021-09-15 ENCOUNTER — Telehealth: Payer: Self-pay | Admitting: Hematology and Oncology

## 2021-09-15 ENCOUNTER — Telehealth: Payer: Self-pay | Admitting: *Deleted

## 2021-09-15 NOTE — Telephone Encounter (Signed)
PC to patient's husband, [name] - informed him that per Dr. Mickeal Skinner, she may take the antibiotic while on avastin.  He verbalizes understanding.

## 2021-09-15 NOTE — Telephone Encounter (Signed)
Scheduled appointment per patients husbands request so they could plan ahead.

## 2021-09-15 NOTE — Telephone Encounter (Signed)
-----   Message from Ventura Sellers, MD sent at 09/15/2021  4:25 PM EST ----- Regarding: RE: Yes ----- Message ----- From: Rolene Course, RN Sent: 09/15/2021   4:25 PM EST To: Wilmon Arms, RN, Ventura Sellers, MD  This patient's husband called, she has a gum infection & was prescribed clarithromycin by her dentist.  They are wanting to know if it is ok for her to take this while on avastin.  Please advise.

## 2021-09-17 ENCOUNTER — Other Ambulatory Visit: Payer: Self-pay | Admitting: Internal Medicine

## 2021-09-18 ENCOUNTER — Other Ambulatory Visit: Payer: Self-pay

## 2021-09-18 ENCOUNTER — Ambulatory Visit (HOSPITAL_COMMUNITY)
Admission: RE | Admit: 2021-09-18 | Discharge: 2021-09-18 | Disposition: A | Discharge status: Home / Self Care | Payer: BC Managed Care – PPO | Source: Ambulatory Visit | Attending: Hematology and Oncology | Admitting: Hematology and Oncology

## 2021-09-18 ENCOUNTER — Encounter (HOSPITAL_COMMUNITY): Payer: Self-pay

## 2021-09-18 DIAGNOSIS — I2510 Atherosclerotic heart disease of native coronary artery without angina pectoris: Secondary | ICD-10-CM | POA: Diagnosis not present

## 2021-09-18 DIAGNOSIS — J189 Pneumonia, unspecified organism: Secondary | ICD-10-CM | POA: Diagnosis not present

## 2021-09-18 DIAGNOSIS — I700 Atherosclerosis of aorta: Secondary | ICD-10-CM | POA: Diagnosis not present

## 2021-09-18 DIAGNOSIS — C3491 Malignant neoplasm of unspecified part of right bronchus or lung: Secondary | ICD-10-CM | POA: Diagnosis not present

## 2021-09-18 DIAGNOSIS — J8410 Pulmonary fibrosis, unspecified: Secondary | ICD-10-CM | POA: Diagnosis not present

## 2021-09-18 DIAGNOSIS — C7951 Secondary malignant neoplasm of bone: Secondary | ICD-10-CM | POA: Diagnosis not present

## 2021-09-18 DIAGNOSIS — J941 Fibrothorax: Secondary | ICD-10-CM | POA: Diagnosis not present

## 2021-09-18 MED ORDER — IOHEXOL 350 MG/ML SOLN
80.0000 mL | Freq: Once | INTRAVENOUS | Status: AC | PRN
  Administered 2021-09-18: 80 mL via INTRAVENOUS

## 2021-09-18 MED ORDER — SODIUM CHLORIDE (PF) 0.9 % IJ SOLN
INTRAMUSCULAR | Status: AC
  Filled 2021-09-18: qty 50

## 2021-09-19 DIAGNOSIS — I6789 Other cerebrovascular disease: Secondary | ICD-10-CM | POA: Diagnosis not present

## 2021-09-20 ENCOUNTER — Telehealth: Payer: Self-pay | Admitting: *Deleted

## 2021-09-20 NOTE — Telephone Encounter (Signed)
-----   Message from Orson Slick, MD sent at 09/19/2021  9:18 AM EST ----- Please let Virginia [name] know that her CT scan of the chest/abdomen/pelvis looks excellent. No evidence of cancer growth or spread. We will continue on her current chemotherapy regimen.   ----- Message ----- From: Interface, Rad Results In Sent: 09/18/2021   6:15 PM EST To: Orson Slick, MD

## 2021-09-20 NOTE — Telephone Encounter (Signed)
TCT patient's husband regarding recent CT scans. Spoke with husband. Advised that her scans look good-no new growth or spread of her cancer. We will continue current treatment regime.  [name] voiced understanding.  He states Virginia [name] is feeling pretty good these days. He is aware of her upcoming appts.

## 2021-09-25 ENCOUNTER — Other Ambulatory Visit (HOSPITAL_COMMUNITY): Payer: Self-pay

## 2021-09-25 ENCOUNTER — Telehealth: Payer: Self-pay | Admitting: *Deleted

## 2021-09-25 ENCOUNTER — Other Ambulatory Visit: Payer: Self-pay | Admitting: Hematology and Oncology

## 2021-09-25 MED ORDER — OXYCODONE HCL 5 MG PO TABS
ORAL_TABLET | ORAL | 0 refills | Status: DC
  Filled 2021-09-25: qty 90, 8d supply, fill #0

## 2021-09-25 MED ORDER — FUROSEMIDE 20 MG PO TABS
20.0000 mg | ORAL_TABLET | Freq: Every day | ORAL | 1 refills | Status: DC
  Filled 2021-09-25 (×2): qty 90, 90d supply, fill #0

## 2021-09-25 MED ORDER — XTAMPZA ER 13.5 MG PO C12A
1.0000 | EXTENDED_RELEASE_CAPSULE | Freq: Two times a day (BID) | ORAL | 0 refills | Status: DC
  Filled 2021-09-25: qty 60, 30d supply, fill #0

## 2021-09-25 NOTE — Telephone Encounter (Signed)
Received call from pt's husband. He is requesting refills of pt's oxycodone, Xtampza and a 90 day supply of her Furosamide. Al refills go to [facility].

## 2021-09-26 ENCOUNTER — Encounter: Payer: Self-pay | Admitting: Hematology and Oncology

## 2021-09-26 ENCOUNTER — Other Ambulatory Visit (HOSPITAL_COMMUNITY): Payer: Self-pay

## 2021-09-26 NOTE — Progress Notes (Signed)
Spoke w/ pt regarding copay assistance for 2023.  Pt would like to re apply so she completed the [employer] for [employer], the [employer] for Alimta and [employer] for [employer], I got the pt's and Dr. Libby Maw signature and faxed today for processing.  I will notify the pt of the outcomes once received.

## 2021-09-27 ENCOUNTER — Telehealth: Payer: Self-pay

## 2021-09-27 ENCOUNTER — Encounter: Payer: Self-pay | Admitting: Hematology and Oncology

## 2021-09-27 NOTE — Progress Notes (Signed)
Pt was approved w/ the [employer] program for $25,000 for Alimta from 05/29/21 through 09/26/22.  Pt's copay will be $25 each infusion.

## 2021-09-27 NOTE — Telephone Encounter (Signed)
Received call from Mr. Rickert regarding his wife. They are asking for advice from Dr. Mickeal Skinner regarding symptom management. Pt is experiencing increased dizziness which they are associating with the Avastin infused on 09/23/21.  Mr.Corum is asking what they should do. He has questions regarding Avastin as well as dosing of Decadron. Patient has apt with Dr. Lorenso Courier on 09/28/21. Requesting call back today if possible.

## 2021-09-28 ENCOUNTER — Other Ambulatory Visit: Payer: Self-pay

## 2021-09-28 ENCOUNTER — Inpatient Hospital Stay: Payer: BC Managed Care – PPO | Attending: Hematology and Oncology

## 2021-09-28 ENCOUNTER — Encounter: Payer: Self-pay | Admitting: Internal Medicine

## 2021-09-28 ENCOUNTER — Other Ambulatory Visit: Payer: Self-pay | Admitting: Hematology and Oncology

## 2021-09-28 ENCOUNTER — Inpatient Hospital Stay: Payer: BC Managed Care – PPO

## 2021-09-28 ENCOUNTER — Encounter: Payer: Self-pay | Admitting: Hematology and Oncology

## 2021-09-28 ENCOUNTER — Inpatient Hospital Stay (HOSPITAL_BASED_OUTPATIENT_CLINIC_OR_DEPARTMENT_OTHER): Payer: BC Managed Care – PPO | Admitting: Internal Medicine

## 2021-09-28 ENCOUNTER — Inpatient Hospital Stay (HOSPITAL_BASED_OUTPATIENT_CLINIC_OR_DEPARTMENT_OTHER): Payer: BC Managed Care – PPO | Admitting: Hematology and Oncology

## 2021-09-28 ENCOUNTER — Other Ambulatory Visit (HOSPITAL_COMMUNITY): Payer: Self-pay

## 2021-09-28 VITALS — BP 141/102 | HR 106 | Resp 17 | Wt 172.5 lb

## 2021-09-28 VITALS — HR 96

## 2021-09-28 DIAGNOSIS — E876 Hypokalemia: Secondary | ICD-10-CM | POA: Diagnosis not present

## 2021-09-28 DIAGNOSIS — R42 Dizziness and giddiness: Secondary | ICD-10-CM | POA: Insufficient documentation

## 2021-09-28 DIAGNOSIS — I2510 Atherosclerotic heart disease of native coronary artery without angina pectoris: Secondary | ICD-10-CM | POA: Diagnosis not present

## 2021-09-28 DIAGNOSIS — Z5112 Encounter for antineoplastic immunotherapy: Secondary | ICD-10-CM | POA: Insufficient documentation

## 2021-09-28 DIAGNOSIS — Z9049 Acquired absence of other specified parts of digestive tract: Secondary | ICD-10-CM | POA: Insufficient documentation

## 2021-09-28 DIAGNOSIS — Y842 Radiological procedure and radiotherapy as the cause of abnormal reaction of the patient, or of later complication, without mention of misadventure at the time of the procedure: Secondary | ICD-10-CM

## 2021-09-28 DIAGNOSIS — I6789 Other cerebrovascular disease: Secondary | ICD-10-CM

## 2021-09-28 DIAGNOSIS — Z95828 Presence of other vascular implants and grafts: Secondary | ICD-10-CM

## 2021-09-28 DIAGNOSIS — I10 Essential (primary) hypertension: Secondary | ICD-10-CM | POA: Diagnosis not present

## 2021-09-28 DIAGNOSIS — Z7952 Long term (current) use of systemic steroids: Secondary | ICD-10-CM | POA: Insufficient documentation

## 2021-09-28 DIAGNOSIS — C7931 Secondary malignant neoplasm of brain: Secondary | ICD-10-CM | POA: Insufficient documentation

## 2021-09-28 DIAGNOSIS — I700 Atherosclerosis of aorta: Secondary | ICD-10-CM | POA: Insufficient documentation

## 2021-09-28 DIAGNOSIS — Z5111 Encounter for antineoplastic chemotherapy: Secondary | ICD-10-CM | POA: Insufficient documentation

## 2021-09-28 DIAGNOSIS — R0602 Shortness of breath: Secondary | ICD-10-CM | POA: Diagnosis not present

## 2021-09-28 DIAGNOSIS — Z86718 Personal history of other venous thrombosis and embolism: Secondary | ICD-10-CM | POA: Diagnosis not present

## 2021-09-28 DIAGNOSIS — C7951 Secondary malignant neoplasm of bone: Secondary | ICD-10-CM

## 2021-09-28 DIAGNOSIS — Z923 Personal history of irradiation: Secondary | ICD-10-CM | POA: Diagnosis not present

## 2021-09-28 DIAGNOSIS — Z79899 Other long term (current) drug therapy: Secondary | ICD-10-CM | POA: Insufficient documentation

## 2021-09-28 DIAGNOSIS — R112 Nausea with vomiting, unspecified: Secondary | ICD-10-CM | POA: Diagnosis not present

## 2021-09-28 DIAGNOSIS — C3431 Malignant neoplasm of lower lobe, right bronchus or lung: Secondary | ICD-10-CM | POA: Insufficient documentation

## 2021-09-28 DIAGNOSIS — C3490 Malignant neoplasm of unspecified part of unspecified bronchus or lung: Secondary | ICD-10-CM | POA: Diagnosis not present

## 2021-09-28 DIAGNOSIS — R918 Other nonspecific abnormal finding of lung field: Secondary | ICD-10-CM

## 2021-09-28 DIAGNOSIS — Z7901 Long term (current) use of anticoagulants: Secondary | ICD-10-CM | POA: Diagnosis not present

## 2021-09-28 LAB — CBC WITH DIFFERENTIAL (CANCER CENTER ONLY)
Abs Immature Granulocytes: 0.14 10*3/uL — ABNORMAL HIGH (ref 0.00–0.07)
Basophils Absolute: 0.1 10*3/uL (ref 0.0–0.1)
Basophils Relative: 1 %
Eosinophils Absolute: 0.1 10*3/uL (ref 0.0–0.5)
Eosinophils Relative: 1 %
HCT: 37.4 % (ref 36.0–46.0)
Hemoglobin: 13.6 g/dL (ref 12.0–15.0)
Immature Granulocytes: 1 %
Lymphocytes Relative: 14 %
Lymphs Abs: 1.6 10*3/uL (ref 0.7–4.0)
MCH: 35.7 pg — ABNORMAL HIGH (ref 26.0–34.0)
MCHC: 36.4 g/dL — ABNORMAL HIGH (ref 30.0–36.0)
MCV: 98.2 fL (ref 80.0–100.0)
Monocytes Absolute: 1.7 10*3/uL — ABNORMAL HIGH (ref 0.1–1.0)
Monocytes Relative: 16 %
Neutro Abs: 7.4 10*3/uL (ref 1.7–7.7)
Neutrophils Relative %: 67 %
Platelet Count: 335 10*3/uL (ref 150–400)
RBC: 3.81 MIL/uL — ABNORMAL LOW (ref 3.87–5.11)
RDW: 14.3 % (ref 11.5–15.5)
WBC Count: 10.9 10*3/uL — ABNORMAL HIGH (ref 4.0–10.5)
nRBC: 0 % (ref 0.0–0.2)

## 2021-09-28 LAB — CMP (CANCER CENTER ONLY)
ALT: 22 U/L (ref 0–44)
AST: 14 U/L — ABNORMAL LOW (ref 15–41)
Albumin: 3.6 g/dL (ref 3.5–5.0)
Alkaline Phosphatase: 70 U/L (ref 38–126)
Anion gap: 9 (ref 5–15)
BUN: 16 mg/dL (ref 8–23)
CO2: 30 mmol/L (ref 22–32)
Calcium: 8.9 mg/dL (ref 8.9–10.3)
Chloride: 102 mmol/L (ref 98–111)
Creatinine: 0.92 mg/dL (ref 0.44–1.00)
GFR, Estimated: >60 mL/min (ref >60)
Glucose, Bld: 130 mg/dL — ABNORMAL HIGH (ref 70–99)
Potassium: 3.3 mmol/L — ABNORMAL LOW (ref 3.5–5.1)
Sodium: 141 mmol/L (ref 135–145)
Total Bilirubin: 0.3 mg/dL (ref 0.3–1.2)
Total Protein: 7.1 g/dL (ref 6.5–8.1)

## 2021-09-28 LAB — URINALYSIS, DIPSTICK ONLY
Bilirubin Urine: NEGATIVE
Glucose, UA: NEGATIVE mg/dL
Hgb urine dipstick: NEGATIVE
Ketones, ur: NEGATIVE mg/dL
Leukocytes,Ua: NEGATIVE
Nitrite: NEGATIVE
Protein, ur: NEGATIVE mg/dL
Specific Gravity, Urine: 1.006 (ref 1.005–1.030)
pH: 7 (ref 5.0–8.0)

## 2021-09-28 LAB — LACTATE DEHYDROGENASE: LDH: 236 U/L — ABNORMAL HIGH (ref 98–192)

## 2021-09-28 LAB — TOTAL PROTEIN, URINE DIPSTICK: Protein, ur: NEGATIVE mg/dL

## 2021-09-28 MED ORDER — HEPARIN SOD (PORK) LOCK FLUSH 100 UNIT/ML IV SOLN
500.0000 [IU] | Freq: Once | INTRAVENOUS | Status: AC | PRN
  Administered 2021-09-28: 500 [IU]

## 2021-09-28 MED ORDER — SODIUM CHLORIDE 0.9 % IV SOLN
500.0000 mg/m2 | Freq: Once | INTRAVENOUS | Status: AC
  Administered 2021-09-28: 900 mg via INTRAVENOUS
  Filled 2021-09-28: qty 20

## 2021-09-28 MED ORDER — ALPRAZOLAM 0.25 MG PO TABS
0.2500 mg | ORAL_TABLET | Freq: Two times a day (BID) | ORAL | 1 refills | Status: DC | PRN
  Filled 2021-09-28: qty 60, 30d supply, fill #0

## 2021-09-28 MED ORDER — PROCHLORPERAZINE MALEATE 10 MG PO TABS
10.0000 mg | ORAL_TABLET | Freq: Once | ORAL | Status: AC
  Administered 2021-09-28: 10 mg via ORAL
  Filled 2021-09-28: qty 1

## 2021-09-28 MED ORDER — SODIUM CHLORIDE 0.9% FLUSH
10.0000 mL | INTRAVENOUS | Status: DC | PRN
  Administered 2021-09-28: 10 mL

## 2021-09-28 MED ORDER — MECLIZINE HCL 25 MG PO TABS
25.0000 mg | ORAL_TABLET | Freq: Three times a day (TID) | ORAL | 0 refills | Status: DC | PRN
  Filled 2021-09-28: qty 30, 10d supply, fill #0

## 2021-09-28 MED ORDER — SODIUM CHLORIDE 0.9 % IV SOLN
10.0000 mg/kg | Freq: Once | INTRAVENOUS | Status: AC
  Administered 2021-09-28: 800 mg via INTRAVENOUS
  Filled 2021-09-28: qty 32

## 2021-09-28 MED ORDER — SODIUM CHLORIDE 0.9% FLUSH
10.0000 mL | INTRAVENOUS | Status: AC | PRN
  Administered 2021-09-28: 10 mL

## 2021-09-28 MED ORDER — SODIUM CHLORIDE 0.9 % IV SOLN: Freq: Once | INTRAVENOUS | Status: AC

## 2021-09-28 MED ORDER — SODIUM CHLORIDE 0.9 % IV SOLN
200.0000 mg | Freq: Once | INTRAVENOUS | Status: AC
  Administered 2021-09-28: 200 mg via INTRAVENOUS
  Filled 2021-09-28: qty 8

## 2021-09-28 NOTE — Progress Notes (Signed)
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 CT of the head showed showed concerning for metastatic disease involving the left cerebellum and right occipital lobe.  2) 09/30/2019: MRI brain confirms mass within the left cerebellum measures 1.6 x 1.3 x 1.5 cm as well as at least 8 metastatic lesions within the supratentorial and infratentorial brain as outlined 3) 10/01/2019: PET CT scan revealed hypermetabolic infrahilar right lower lobe mass with hypermetabolic nodal metastases in the right hilum, mediastinum, right supraclavicular region, left axilla and lower left neck. 4) 10/08/2019: US guided biopsy of the lymph nodes confirms poorly differentiated non-small cell carcinoma. Immunohistochemistry confirms adenocarcinoma of lung primary. PD-L1 and NGS later revealed a MET Exon 14 mutation and TPS of 90%.  5) 10/12/2019: Establish care with Dr. Lorenso Courier  6) 11/04/2019: Day 1 of capmatinib 425m BID 7) 11/09/2019: presented to Rad/Onc with a DVT in LLE. Seen in symptom management clinic and started on apixaban.  8) 12/29/2019: CT C/A/P showed interval decrease in size of mass involving the superior segment of right lower lobe and reduction in mediastinal and left supraclavicular adenopathy though some small spinal lesions were noted in the interim between the two sets of scans 9) 01/25/2020: temporarily stopped capmatinib due to symptoms of nausea/fatigue. Restarted therapy on 01/30/2020 after brief chemo holiday.  10) 03/22/2020: CT C/A/P reveals a mixed picture, with new lung nodule and  increased lymphadenopathy, however response in bone lesions. D/c capmatinib therapy 11) 04/08/2020: start of Carbo/Pem/Pem for 2nd line treatment. Cycle 1 Day 1   12) 04/29/2020: Cycle 2 Day 1 Carbo/Pem/Pem  13) 05/20/2020: Cycle 3 Day 1 Carbo/Pem/Pem  14) 06/09/2020: Cycle 4 Day 1 Carbo/Pem/Pem  15) 06/29/2020: Cycle 5 Day 1 maintenance Pem/Pem  16) 07/20/2020: Cycle 6 Day 1 maintenance Pem/Pem  17) 08/12/2020: Cycle 7 Day 1 maintenance Pem/Pem  18) 09/01/2020: Cycle 8 Day 1 maintenance Pem/Pem  19) 09/21/2020: Cycle 9 Day 1 maintenance Pem/Pem  20) 10/13/2020: Cycle 10 Day 1 maintenance Pem/Pem  21) 11/03/2020: Cycle 11 Day 1 maintenance Pem/Pem  22) 11/25/2020:  Cycle 12 Day 1 maintenance Pem/Pem 23) 12/15/2020:  Cycle 13 Day 1 maintenance Pem/Pem 24) 01/05/2021:  Cycle 14 Day 1 maintenance Pem/Pem 25) 01/26/2021:  Cycle 15 Day 1 maintenance Pem/Pem plus Bevacizumab.  02/17/2021: Cycle 16 Day 1 maintenance Pem/Pem plus Bevacizumab.  03/10/2021: Cycle 17 Day 1 maintenance Pem/Pem plus Bevacizumab.  03/31/2021: Cycle 18 Day 1 maintenance Pem/Pem  04/20/2021:  Cycle 19 Day 1 maintenance Pem/Pem  05/12/2021: Cycle 20 Day 1 maintenance Pem/Pem 06/01/2021: Cycle 21 Day 1 maintenance Pem/Pem 06/22/2021: Cycle 22 Day 1 maintenance Pem/Pem 07/13/2021: Cycle 23 Day 1 maintenance Pem/Pem 08/16/2021: Cycle 24 Day 1 maintenance Pem/Pem 09/08/2021: Cycle 25 Day 1 maintenance Pem/Pem plus Bevacizumab 09/28/2021: Cycle 26 Day 1 maintenance Pem/Pem plus Bevacizumab  Interval History:  [place] DICKERMAN66y.o. female with medical history significant for metastatic adenocarcinoma of the lung presents for a follow up visit. The patient's last visit was on 08/16/2021.  In the interim since the last visit, she has continued pem/pem maintenance. Additionally, he was evaluated by Dr. VMickeal Skinneron 08/29/21 with recommendations  to resume Bevacizumab since her dizziness has worsened.  Today is Cycle 26 Day 1.   On exam today Mrs.  Savarese is accompanied by her husband.  She reports her dizziness has recurred and she felt the entire room spinning.  She will be discussing this with Dr. Mickeal Skinner later today.  She notes that she is having swelling in her face but otherwise is tolerating the steroid therapy well.  She notes that her pain is under good control and she is taking the [facility] ER with 2-3 oxycodones per day.  She has not required any of the as needed nausea medication as olanzapine is kept her nausea under good control.  She denies any nausea, vomiting or abdominal pain. She denies easy bruising or signs of bleeding. She has shortness of breath with exertion that is her baseline. She denies any fevers, chills, night sweats, chest pain, cough, rash or neuropathy. She has no other complaints. Rest of the 10 point ROS is below.  She is willing and able to proceed with treatment at this time.   MEDICAL HISTORY:  Past Medical History:  Diagnosis Date   Anemia    Anxiety    Concussion 09/28/2019   DVT (deep venous thrombosis) ([place]) 2021   L leg   Dyspnea    GERD (gastroesophageal reflux disease)    Hypercholesterolemia    per pt, she does not have elevated lipids   Hypertension    met lung ca dx'd 09/2019   mets to spine, hip and brain   PONV (postoperative nausea and vomiting)    Tobacco abuse     SURGICAL HISTORY: Past Surgical History:  Procedure Laterality Date   ABDOMINAL HYSTERECTOMY     partial/ left ovaries   APPLICATION OF CRANIAL NAVIGATION N/A 12/02/2020   Procedure: APPLICATION OF CRANIAL NAVIGATION;  Surgeon: Erline Levine, MD;  Location: [place];  Service: Neurosurgery;  Laterality: N/A;   CHOLECYSTECTOMY     CRANIOTOMY N/A 12/02/2020   Procedure: Posterior fossa craniotomy for tumor resection with brainlab;  Surgeon: Erline Levine, MD;  Location: [place];  Service: Neurosurgery;  Laterality: N/A;   DILATION AND CURETTAGE OF UTERUS     IR IMAGING GUIDED PORT INSERTION  10/23/2019   KYPHOPLASTY N/A  03/15/2020   Procedure: Thoracic Eight KYPHOPLASTY;  Surgeon: Erline Levine, MD;  Location: [place];  Service: Neurosurgery;  Laterality: N/A;  prone    LIPOMA EXCISION  2018   removed under left breast and right thigh.   TUBAL LIGATION      ALLERGIES:  has No Known Allergies.  MEDICATIONS:  Current Outpatient Medications  Medication Sig Dispense Refill   acetaminophen (TYLENOL) 500 MG tablet Take 1,000 mg by mouth every 8 (eight) hours as needed for moderate pain.     albuterol (VENTOLIN HFA) 108 (90 Base) MCG/ACT inhaler INHALE 2 PUFFS INTO THE LUNGS EVERY 6 HOURS AS NEEDED FOR WHEEZING OR SHORTNESS OF BREATH (Patient taking differently: Inhale 2 puffs into the lungs every 6 (six) hours as needed for shortness of breath or wheezing.) 6.7 g 11   ALPRAZolam (XANAX) 0.25 MG tablet Take 1 tablet (0.25 mg total) by mouth 2 (two) times daily as needed for anxiety 60 tablet 1   amLODipine (NORVASC) 10 MG tablet Take 1 tablet (10 mg total) by mouth daily. 30 tablet 3   Cyanocobalamin (VITAMIN B12 PO) Take 2 tablets by mouth daily. Gummies     cyclobenzaprine (FLEXERIL) 5 MG tablet Take 5 mg by mouth at bedtime.  dexamethasone (DECADRON) 1 MG tablet Take 2 tablets by mouth daily. 60 tablet 1   folic acid (FOLVITE) 1 MG tablet Take 1 tablet by mouth daily. 90 tablet 1   furosemide (LASIX) 20 MG tablet Take 1 tablet by mouth daily. 90 tablet 1   lidocaine-prilocaine (EMLA) cream Apply to port as needed. 30 g 0   meclizine (ANTIVERT) 25 MG tablet Take 1 tablet (25 mg total) by mouth 3 (three) times daily as needed for dizziness. 30 tablet 0   Multiple Vitamins-Minerals (AIRBORNE PO) Take 1 tablet by mouth daily.      ofloxacin (OCUFLOX) 0.3 % ophthalmic solution 2 drops each eye 4 times a day for 5 days (Patient taking differently: Place 2 drops into both eyes daily as needed (irritation).) 5 mL 1   OLANZapine (ZYPREXA) 2.5 MG tablet TAKE 1 TABLET BY MOUTH EVERY NIGHT AT BEDTIME 90 tablet 1    ondansetron (ZOFRAN) 4 MG tablet TAKE 1 TO 2 TABLETS BY MOUTH 2 TIMES DAILY AS NEEDED 60 tablet 0   oxyCODONE (OXY IR/ROXICODONE) 5 MG immediate release tablet TAKE 1 TO 2 TABLETS BY MOUTH EVERY 4 HOURS AS NEEDED FOR SEVERE PAIN 90 tablet 0   pantoprazole (PROTONIX) 40 MG tablet TAKE 1 TABLET BY MOUTH ONCE DAILY 90 tablet 3   pentoxifylline (TRENTAL) 400 MG CR tablet TAKE 1 TABLET BY MOUTH WITH FOOD FOR 1 WEEK THEN TAKE 1 TABLET BY MOUTH 2 TIMES DAILY THEREAFTER 60 tablet 2   polyethylene glycol (MIRALAX / GLYCOLAX) 17 g packet Take 17 g by mouth 2 (two) times daily.      potassium chloride SA (KLOR-CON M) 20 MEQ tablet Take 1 tablet by mouth 2  times daily. 60 tablet 2   prochlorperazine (COMPAZINE) 10 MG tablet Take 10 mg by mouth every 6 (six) hours as needed for nausea or vomiting.     promethazine (PHENERGAN) 12.5 MG tablet Take 1 to 2 tablets by mouth every 6 hours as needed for nausea 20 tablet 0   Vitamin D3 (VITAMIN D) 25 MCG tablet Take 2,000 Units by mouth daily.     vitamin E 180 MG (400 UNITS) capsule Take 400 IU daily x 1 week, then 400 IU BID (Patient taking differently: Take 400 Units by mouth in the morning and at bedtime. Take 400 IU daily x 1 week, then 400 IU BID) 60 capsule 4   XTAMPZA ER 13.5 MG C12A Take 1 capsule by mouth in the morning and at bedtime. (every 12 hours) 60 capsule 0   No current facility-administered medications for this visit.    REVIEW OF SYSTEMS:   Constitutional: ( - ) fevers, ( - )  chills , ( - ) night sweats Eyes: ( - ) blurriness of vision, ( - ) double vision, ( - ) watery eyes Ears, nose, mouth, throat, and face: ( - ) mucositis, ( - ) sore throat Respiratory: ( - ) cough, ( - ) dyspnea, ( - ) wheezes Cardiovascular: ( - ) palpitation, ( - ) chest discomfort, ( - ) lower extremity swelling Gastrointestinal:  ( - ) nausea, ( - ) heartburn, ( - ) change in bowel habits Skin: ( - ) abnormal skin rashes Lymphatics: ( - ) new lymphadenopathy, ( - )  easy bruising Neurological: ( - ) numbness, ( - ) tingling, ( - ) new weaknesses Behavioral/Psych: ( - ) mood change, ( - ) new changes  All other systems were reviewed with the patient and  are negative.  PHYSICAL EXAMINATION: ECOG PERFORMANCE STATUS: 1 - Symptomatic but completely ambulatory  Vitals:   09/28/21 1115  BP: (!) 141/102  Pulse: (!) 106  Resp: 17  SpO2: 97%    Filed Weights   09/28/21 1115  Weight: 172 lb 8 oz (78.2 kg)     GENERAL: well appearing middle aged Caucasian female in NAD  SKIN: skin color, texture, turgor are normal, no rashes or significant lesions EYES: conjunctiva are pink and non-injected, sclera clear LUNGS: wheezing at lung bases. clear to auscultation and percussion with normal breathing effort HEART: regular rate & rhythm and no murmurs and bilateral + 2 pitting lower extremity edema Musculoskeletal: no cyanosis of digits and no clubbing PSYCH: alert & oriented x 3, fluent speech NEURO: no focal motor/sensory deficits    LABORATORY DATA:  I have reviewed the data as listed CBC Latest Ref Rng & Units 09/28/2021 09/08/2021 08/16/2021  WBC 4.0 - 10.5 K/uL 10.9(H) 10.5 8.2  Hemoglobin 12.0 - 15.0 g/dL 13.6 12.6 12.1  Hematocrit 36.0 - 46.0 % 37.4 35.1(L) 35.7(L)  Platelets 150 - 400 K/uL 335 350 215    CMP Latest Ref Rng & Units 09/28/2021 09/08/2021 08/16/2021  Glucose 70 - 99 mg/dL 130(H) 100(H) 195(H)  BUN 8 - 23 mg/dL _0 Creatinine 0.44 - 1.00 mg/dL 0.92 0.90 0.76  Sodium 135 - 145 mmol/L 141 139 143  Potassium 3.5 - 5.1 mmol/L 3.3(L) 3.0(L) 3.7  Chloride 98 - 111 mmol/L 102 101 107  CO2 22 - 32 mmol/L _1 Calcium 8.9 - 10.3 mg/dL 8.9 9.0 7.8(L)  Total Protein 6.5 - 8.1 g/dL 7.1 6.8 6.7  Total Bilirubin 0.3 - 1.2 mg/dL 0.3 0.4 0.4  Alkaline Phos 38 - 126 U/L 70 50 54  AST 15 - 41 U/L 14(L) 15 19  ALT 0 - 44 U/L _2 RADIOGRAPHIC STUDIES: I personally have viewed the radiographic studies below: CT CHEST  ABDOMEN PELVIS W CONTRAST  Result Date: 09/18/2021 CLINICAL DATA:  Metastatic right squamous cell lung cancer restaging, assess treatment response, ongoing chemotherapy EXAM: CT CHEST, ABDOMEN, AND PELVIS WITH CONTRAST TECHNIQUE: Multidetector CT imaging of the chest, abdomen and pelvis was performed following the standard protocol during bolus administration of intravenous contrast. CONTRAST:  74m OMNIPAQUE IOHEXOL 350 MG/ML SOLN, additional oral enteric contrast COMPARISON:  06/21/2021 FINDINGS: CT CHEST FINDINGS Cardiovascular: Aortic atherosclerosis. Right chest port catheter. Normal heart size. Scattered left coronary artery calcifications. No pericardial effusion. Mediastinum/Nodes: Unchanged post treatment appearance of subcarinal and right hilar soft tissue (series 2, image 29). No discretely enlarged mediastinal, hilar, or axillary lymph nodes. Thyroid gland, trachea, and esophagus demonstrate no significant findings. Lungs/Pleura: Unchanged post treatment/post radiation fibrosis and consolidation of the medial right lower lobe. Numerous small solid and ground-glass nodules scattered throughout the lungs are unchanged, again most concentrated in the lung apices (series 4, image 28, 40, 32). No pleural effusion or pneumothorax. Musculoskeletal: No chest wall mass. CT ABDOMEN PELVIS FINDINGS Hepatobiliary: No focal liver abnormality is seen. Status post cholecystectomy. No biliary dilatation. Pancreas: Unremarkable. No pancreatic ductal dilatation or surrounding inflammatory changes. Spleen: Normal in size without significant abnormality. Adrenals/Urinary Tract: Adrenal glands are unremarkable. Kidneys are normal, without renal calculi, solid lesion, or hydronephrosis. Bladder is unremarkable. Stomach/Bowel: Stomach is within normal limits. Appendix appears normal. No evidence of bowel wall thickening, distention, or inflammatory changes. Moderate burden of stool throughout the colon and rectum.  Vascular/Lymphatic: Aortic  atherosclerosis. No enlarged abdominal or pelvic lymph nodes. Reproductive: Status post hysterectomy. Other: No abdominal wall hernia or abnormality. No ascites. Musculoskeletal: No acute osseous findings. Status post vertebral cement augmentation of T8 (series 6, image 94). Unchanged sclerotic lesions of the left and right ilia (series 2, image 91). IMPRESSION: 1. Unchanged post treatment/post radiation fibrosis and consolidation of the medial right lower lobe. 2. Unchanged post treatment appearance of subcarinal and right hilar soft tissue. No discretely enlarged lymph nodes. 3. Numerous small solid and ground-glass nodules scattered throughout the lungs are unchanged. 4. Unchanged sclerotic osseous metastatic lesions. Status post vertebral cement augmentation of T8. 5. No evidence of new metastatic disease in the chest, abdomen, or pelvis. 6. Coronary artery disease. Aortic Atherosclerosis (ICD10-I70.0). Electronically Signed   By: Delanna Ahmadi M.D.   On: 09/18/2021 18:12     ASSESSMENT & PLAN KORBIN NOTARO 67 y.o. female with medical history significant for metastatic adenocarcinoma of the lung presents for a follow up visit. 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   Previously we discussed Carboplatin/Pembrolizumab/Pemetrexed and the expected side effect from these medications.  We discussed the possible side effects of immunotherapy including colitis, hepatitis, dermatitis, and pneumonitis.  Additionally we discussed the side effects of carboplatin which include suppression of blood counts, nephrotoxicity, and nausea.  Additionally we discussed pemetrexed which can also have effects on counts and would require supplementation with vitamin X32 and folic acid.  She voiced her understanding of these side effects and treatment plans moving forward.  I also noted that we would be able to drop the chemotherapy agents that she had  intolerance to continue with pembrolizumab alone given her high TPS score.  The patient has a markedly elevated TPS score (90%)   Mrs. Eyman continues to tolerate the treatment. Her labs from today were reviewed without any intervention needed. Patient will proceed with treatment as planned and return to the clinic in 3 weeks prior to her next cycle.   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 Due to worsening dizziness, she will resumed this with Cycle 25.  --patient will be followed every 3 weeks with labs and clinic visit --repeat CT CAP on 09/18/2021 showed stable disease with no evidence of progression. Next due in April 2023.  --supportive therapy as listed below.  --RTC q 3 weeks for continued monitoring/chemotherapy treatments.    #Nausea/Vomiting, improving -- provided patient with Zofran 4-1m q8h PRN and compazine 154mq6H for breakthrough PRN.  She is currently using Phenergan for breakthrough. --continue olanzpaine 2.5 mg PO QHS to help with nausea. Marked improvement since the addition of this medication on 01/29/2020.  --continue to monitor   #Hypokalemia, stable --likely 2/2 to decreased PO intake, hypovolemia, and BP medications --Potassium level 3.3 today.   --Currently taking 20 mEq PO potassium daily. Recommend to increase to twice daily   #Pain Control, stable --continue oxycodone 45m81m4H PRN. Can increase dose to 57m61mH if pain is severe. She is taking approximately 2-3 pills per day.  --continue Xtampza ER BID for long acting support.  --continue to take with Senokot to prevent opioid induced constipation. 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 Last 07/13/2021, next  due in Feb 2023.  --nausea  as above    #Brain Metastasis, stable --appreciate the assistance of Dr. 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 --radiation therapy to the brain completed on 11/16/2019.  --MRI brain on 04/29/2020 showed evidence of small brain bleed. Eliquis therapy was discontinued.  --MRI on 11/04/2020 showed concern for progression of intracranial disease.  --On 12/02/2020, patient underwent craniotomy, resection of progressive cerebellar mass by Dr. Vertell Limber. 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  --MRI on 12/23/2020 showed evidence of radiation necrosis. Dr. Mickeal Skinner evaluated the patient on 01/02/2021 with recommendations to add IV Bevacizumab 10 mg/kg q 3 weeks ( started 01/26/2021). Completed a full course of this therapy. --05/26/2021: MRI brain showed no substantial changes.  --Patient was evaluated by Dr. Mickeal Skinner on 08/29/2021 and recommended to resume Bevacizumab due to worsening dizziness.   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    # Hypertension: --BP at today's visit was 141/102. Continue Norvasc to 10 mg daily.  --continue to monitor at home and in clinic.    No orders of the defined types were placed in this encounter.   All questions were answered. The patient knows to call the clinic with any problems, questions or concerns.  I have spent a total of 30 minutes minutes of face-to-face and non-face-to-face time, preparing to see the patient, performing a medically appropriate examination, counseling and educating the patient,  documenting clinical information in the electronic health record, and care coordination.    Orson Slick, MD Department of Hematology/Oncology [facility] at [facility] Phone: [phone]    09/28/2021 3:44 PM   Literature Support:  Lorenza Chick, Clearence Cheek, Duffy Rhody, Felip E, [place] F, Domine M, [place], Hochmair MJ, [place], [place], Bischoff HG, [place], Grossi F, [place], Reck M, [place], [place], [place], Rubio-Viqueira B, Novello S, Kurata T, Gray JE, Vida J, Wei Z, Yang J, Raftopoulos H, [place], [place] [employer]; [id number] Investigators. Pembrolizumab plus Chemotherapy in Metastatic Non-Small-Cell Lung Cancer. [place] Med. 2018 May 31;378(22):2078-2092.  --Median progression-free survival was 8.8 months (95% CI, 7.6 to 9.2) in the pembrolizumab-combination group and 4.9 months (95% CI, 4.7 to 5.5) in the placebo-combination group (hazard ratio for disease progression or death, 0.52; 95% CI, 0.43 to 0.64; P<0.001). Adverse events of grade 3 or higher occurred in 67.2% of the patients in the pembrolizumab-combination group and in 65.8% of those in the placebo-combination group.  Jodene Nam L. Efficacy of pemetrexed-based regimens in advanced non-small cell lung cancer patients with activating epidermal growth factor receptor mutations after tyrosine kinase inhibitor failure: a systematic review. Onco Targets Ther. 2018;11:2121-2129.   --The weighted median PFS, median OS, and ORR for patients treated with pem regimens were 5.09 months, 15.91 months, and 30.19%, respectively. Our systematic review results showed a favorable efficacy profile of pem regimens in NSCLC patients with EGFR mutation after EGFR-TKI failure.

## 2021-09-28 NOTE — Patient Instructions (Signed)
[facility]  Discharge Instructions: Thank you for choosing [facility] to provide your oncology and hematology care.   If you have a lab appointment with the [facility], please go directly to the [facility] and check in at the registration area.   Wear comfortable clothing and clothing appropriate for easy access to any Portacath or PICC line.   We strive to give you quality time with your provider. You may need to reschedule your appointment if you arrive late (15 or more minutes).  Arriving late affects you and other patients whose appointments are after yours.  Also, if you miss three or more appointments without notifying the office, you may be dismissed from the clinic at the providers discretion.      For prescription refill requests, have your pharmacy contact our office and allow 72 hours for refills to be completed.    Today you received the following chemotherapy and/or immunotherapy agents pemetrexed, pembrolizumab, bevacizumab      To help prevent nausea and vomiting after your treatment, we encourage you to take your nausea medication as directed.  BELOW ARE SYMPTOMS THAT SHOULD BE REPORTED IMMEDIATELY: *FEVER GREATER THAN 100.4 F (38 C) OR HIGHER *CHILLS OR SWEATING *NAUSEA AND VOMITING THAT IS NOT CONTROLLED WITH YOUR NAUSEA MEDICATION *UNUSUAL SHORTNESS OF BREATH *UNUSUAL BRUISING OR BLEEDING *URINARY PROBLEMS (pain or burning when urinating, or frequent urination) *BOWEL PROBLEMS (unusual diarrhea, constipation, pain near the anus) TENDERNESS IN MOUTH AND THROAT WITH OR WITHOUT PRESENCE OF ULCERS (sore throat, sores in mouth, or a toothache) UNUSUAL RASH, SWELLING OR PAIN  UNUSUAL VAGINAL DISCHARGE OR ITCHING   Items with * indicate a potential emergency and should be followed up as soon as possible or go to the Emergency Department if any problems should occur.  Please show the CHEMOTHERAPY ALERT CARD or  IMMUNOTHERAPY ALERT CARD at check-in to the Emergency Department and triage nurse.  Should you have questions after your visit or need to cancel or reschedule your appointment, please contact [facility]  Dept: [phone]  and follow the prompts.  Office hours are 8:00 a.m. to 4:30 p.m. Monday - Friday. Please note that voicemails left after 4:00 p.m. may not be returned until the following business day.  We are closed weekends and major holidays. You have access to a nurse at all times for urgent questions. Please call the main number to the clinic Dept: [phone] and follow the prompts.   For any non-urgent questions, you may also contact your provider using MyChart. We now offer e-Visits for anyone 65 and older to request care online for non-urgent symptoms. For details visit [email or web address].   Also download the MyChart app! Go to the app store, search "MyChart", open the app, select [facility], and log in with your MyChart username and password.  Due to Covid, a mask is required upon entering the hospital/clinic. If you do not have a mask, one will be given to you upon arrival. For doctor visits, patients may have 1 support person aged 65 or older with them. For treatment visits, patients cannot have anyone with them due to current Covid guidelines and our immunocompromised population.

## 2021-09-28 NOTE — Progress Notes (Signed)
[facility] [place], [place] [address] [phone]   Interval Evaluation  Date of Service: 09/28/21 Patient Name: Virginia [name] Patient MRN: [id number] Patient DOB: December 28, 1955 Provider: Ventura Sellers, MD  Identifying Statement:  Virginia [name] is a 65 y.o. 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 Isidore Moos 12/02/20: Craniotomy, resection of progressive cerebellar mass by Dr. Vertell Limber.  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  She describes one day of severe and refractory vertigo, room spinning type, yesterday.  Day prior and today have actually been normal, baseline.  Second avastin infusion was today along with her chemo/immuno.  No seizures or headaches.    H+P (10/15/19) Patient presents to clinic to discuss recent neurologic symptoms and imaging findings.  She describes a fall while climbing stairs on 1/20; she hit the back of her and lost consciousness.  She is unclear whether she lost her balance or had mechanical provocation, but she had been drinking that evening.  She was treated initially as a trauma, CT demonstrated a lesion in the posterior fossa, later confirmed as a metastasis by MRI, along with 7 additional smaller lesions.  It took her almost two weeks to return for cognition and balance to return to "normal" which she feels she is about at right now.  Baseline she is high functioning and independent, works as a bNeurosurgeon  Has been taking decadron 251mtwice per day.  Plans to initiate chemotherapy soon with Dr. DoLorenso Courier Medications: Current Outpatient Medications on File Prior to Visit  Medication Sig Dispense Refill   acetaminophen (TYLENOL) 500 MG tablet  Take 1,000 mg by mouth every 8 (eight) hours as needed for moderate pain.     albuterol (VENTOLIN HFA) 108 (90 Base) MCG/ACT inhaler INHALE 2 PUFFS INTO THE LUNGS EVERY 6 HOURS AS NEEDED FOR WHEEZING OR SHORTNESS OF BREATH (Patient taking differently: Inhale 2 puffs into the lungs every 6 (six) hours as needed for shortness of breath or wheezing.) 6.7 g 11   ALPRAZolam (XANAX) 0.25 MG tablet Take 1 tablet (0.25 mg total) by mouth 2 (two) times daily as needed for anxiety 60 tablet 5   amLODipine (NORVASC) 10 MG tablet Take 1 tablet (10 mg total) by mouth daily. 30 tablet 3   Cyanocobalamin (VITAMIN B12 PO) Take 2 tablets by mouth daily. Gummies     cyclobenzaprine (FLEXERIL) 5 MG tablet Take 5 mg by mouth at bedtime.     dexamethasone (DECADRON) 1 MG tablet Take 2 tablets by mouth daily. 60 tablet 1   folic acid (FOLVITE) 1 MG tablet Take 1 tablet by mouth daily. 90 tablet 1   furosemide (LASIX) 20 MG tablet Take 1 tablet by mouth daily. 90 tablet 1   lidocaine-prilocaine (EMLA) cream Apply to port as needed. 30 g 0   Multiple Vitamins-Minerals (AIRBORNE PO) Take 1 tablet by mouth daily.      ofloxacin (OCUFLOX) 0.3 % ophthalmic solution 2 drops each eye 4 times a day for 5 days (Patient taking differently: Place 2 drops into both eyes daily as needed (irritation).) 5 mL 1   OLANZapine (ZYPREXA) 2.5 MG tablet TAKE 1 TABLET BY MOUTH EVERY NIGHT AT BEDTIME 90 tablet 1   ondansetron (  ZOFRAN) 4 MG tablet TAKE 1 TO 2 TABLETS BY MOUTH 2 TIMES DAILY AS NEEDED 60 tablet 0   oxyCODONE (OXY IR/ROXICODONE) 5 MG immediate release tablet TAKE 1 TO 2 TABLETS BY MOUTH EVERY 4 HOURS AS NEEDED FOR SEVERE PAIN 90 tablet 0   pantoprazole (PROTONIX) 40 MG tablet TAKE 1 TABLET BY MOUTH ONCE DAILY 90 tablet 3   pentoxifylline (TRENTAL) 400 MG CR tablet TAKE 1 TABLET BY MOUTH WITH FOOD FOR 1 WEEK THEN TAKE 1 TABLET BY MOUTH 2 TIMES DAILY THEREAFTER 60 tablet 2   polyethylene glycol (MIRALAX / GLYCOLAX) 17 g packet Take 17  g by mouth 2 (two) times daily.      potassium chloride SA (KLOR-CON M) 20 MEQ tablet Take 1 tablet by mouth 2  times daily. 60 tablet 2   prochlorperazine (COMPAZINE) 10 MG tablet Take 10 mg by mouth every 6 (six) hours as needed for nausea or vomiting.     promethazine (PHENERGAN) 12.5 MG tablet Take 1 to 2 tablets by mouth every 6 hours as needed for nausea 20 tablet 0   Vitamin D3 (VITAMIN D) 25 MCG tablet Take 2,000 Units by mouth daily.     vitamin E 180 MG (400 UNITS) capsule Take 400 IU daily x 1 week, then 400 IU BID (Patient taking differently: Take 400 Units by mouth in the morning and at bedtime. Take 400 IU daily x 1 week, then 400 IU BID) 60 capsule 4   XTAMPZA ER 13.5 MG C12A Take 1 capsule by mouth in the morning and at bedtime. (every 12 hours) 60 capsule 0   Current Facility-Administered Medications on File Prior to Visit  Medication Dose Route Frequency Provider Last Rate Last Admin   sodium chloride flush (NS) 0.9 % injection 10 mL  10 mL Intracatheter PRN Orson Slick, MD   10 mL at 09/28/21 1440    Allergies: No Known Allergies Past Medical History:  Past Medical History:  Diagnosis Date   Anemia    Anxiety    Concussion 09/28/2019   DVT (deep venous thrombosis) ([facility]) 2021   L leg   Dyspnea    GERD (gastroesophageal reflux disease)    Hypercholesterolemia    per pt, she does not have elevated lipids   Hypertension    met lung ca dx'd 09/2019   mets to spine, hip and brain   PONV (postoperative nausea and vomiting)    Tobacco abuse    Past Surgical History:  Past Surgical History:  Procedure Laterality Date   ABDOMINAL HYSTERECTOMY     partial/ left ovaries   APPLICATION OF CRANIAL NAVIGATION N/A 12/02/2020   Procedure: APPLICATION OF CRANIAL NAVIGATION;  Surgeon: Erline Levine, MD;  Location: [facility];  Service: Neurosurgery;  Laterality: N/A;   CHOLECYSTECTOMY     CRANIOTOMY N/A 12/02/2020   Procedure: Posterior fossa craniotomy for tumor resection with  brainlab;  Surgeon: Erline Levine, MD;  Location: [facility];  Service: Neurosurgery;  Laterality: N/A;   DILATION AND CURETTAGE OF UTERUS     IR IMAGING GUIDED PORT INSERTION  10/23/2019   KYPHOPLASTY N/A 03/15/2020   Procedure: Thoracic Eight KYPHOPLASTY;  Surgeon: Erline Levine, MD;  Location: [facility];  Service: Neurosurgery;  Laterality: N/A;  prone    LIPOMA EXCISION  2018   removed under left breast and right thigh.   TUBAL LIGATION     Social History:  Social History   Socioeconomic History   Marital status: Married  Spouse name: Not on file   Number of children: 3   Years of education: Not on file   Highest education level: Not on file  Occupational History   Occupation: owns printing co    Employer: [employer]  Tobacco Use   Smoking status: Former    Packs/day: 0.25    Types: Cigarettes    Quit date: 10/31/2012    Years since quitting: 8.9   Smokeless tobacco: Never  Vaping Use   Vaping Use: Never used  Substance and Sexual Activity   Alcohol use: Yes    Alcohol/week: 0.0 standard drinks    Comment: rare   Drug use: No   Sexual activity: Yes  Other Topics Concern   Not on file  Social History Narrative   Not on file   Social Determinants of Health   Financial Resource Strain: Not on file  Food Insecurity: Not on file  Transportation Needs: Not on file  Physical Activity: Not on file  Stress: Not on file  Social Connections: Not on file  Intimate Partner Violence: Not on file   Family History:  Family History  Problem Relation Age of Onset   Heart disease Father    Drug abuse Daughter    Drug abuse Son    Cancer Sister    Heart disease Brother    Heart attack Brother     Review of Systems: Constitutional: Doesn't report fevers, chills or abnormal weight loss Eyes: Doesn't report blurriness of vision Ears, nose, mouth, throat, and face: Doesn't report sore throat Respiratory: Doesn't report cough, dyspnea or wheezes Cardiovascular: Doesn't report  palpitation, chest discomfort  Gastrointestinal:  Doesn't report nausea, constipation, diarrhea GU: Doesn't report incontinence Skin: Doesn't report skin rashes Neurological: Per HPI Musculoskeletal: Doesn't report joint pain Behavioral/Psych: Doesn't report anxiety  Physical Exam: Wt Readings from Last 3 Encounters:  09/28/21 172 lb 8 oz (78.2 kg)  09/08/21 163 lb (73.9 kg)  08/29/21 170 lb (77.1 kg)   Temp Readings from Last 3 Encounters:  09/08/21 98.1 F (36.7 C) (Oral)  08/29/21 98.1 F (36.7 C) (Temporal)  08/17/21 98.8 F (37.1 C) (Oral)   BP Readings from Last 3 Encounters:  09/28/21 (!) 141/102  09/08/21 130/87  08/29/21 (!) 129/92   Pulse Readings from Last 3 Encounters:  09/28/21 96  09/28/21 (!) 106  09/08/21 98    KPS: 90. General: Alert, cooperative, pleasant, in no acute distress Head: Normal EENT: No conjunctival injection or scleral icterus.  Lungs: Resp effort normal Cardiac: Regular rate Abdomen: Non-distended abdomen Skin: No rashes cyanosis or petechiae. Extremities: No clubbing or edema  Neurologic Exam: Mental Status: Awake, alert, attentive to examiner. Oriented to self and environment. Language is fluent with intact comprehension.  Cranial Nerves: Visual acuity is grossly normal. Visual fields are full. Extra-ocular movements intact. No ptosis. Face is symmetric Motor: Tone and bulk are normal. Power is full in both arms and legs. Reflexes are symmetric, no pathologic reflexes present.  Sensory: Intact to light touch Gait: Moderate impairment to tandem gait only  Labs: I have reviewed the data as listed    Component Value Date/Time   NA 141 09/28/2021 1032   K 3.3 (L) 09/28/2021 1032   CL 102 09/28/2021 1032   CO2 30 09/28/2021 1032   GLUCOSE 130 (H) 09/28/2021 1032   BUN 16 09/28/2021 1032   CREATININE 0.92 09/28/2021 1032   CREATININE 0.58 11/15/2017 0948   CALCIUM 8.9 09/28/2021 1032   PROT 7.1 09/28/2021 1032  ALBUMIN 3.6  09/28/2021 1032  ° AST 14 (L) 09/28/2021 1032  ° ALT 22 09/28/2021 1032  ° ALKPHOS 70 09/28/2021 1032  ° BILITOT 0.3 09/28/2021 1032  ° GFRNONAA >60 09/28/2021 1032  ° GFRNONAA 100 11/15/2017 0948  ° GFRAA >60 06/09/2020 1051  ° GFRAA 116 11/15/2017 0948  ° °Lab Results  °Component Value Date  ° WBC 10.9 (H) 09/28/2021  ° NEUTROABS 7.4 09/28/2021  ° HGB 13.6 09/28/2021  ° HCT 37.4 09/28/2021  ° MCV 98.2 09/28/2021  ° PLT 335 09/28/2021  ° ° °Imaging: ° °[facility] Clinician Interpretation: I have personally reviewed the CNS images as listed.  My interpretation, in the context of the patient's clinical presentation, is treatment effect vs true progression ° °CT CHEST ABDOMEN PELVIS W CONTRAST ° °Result Date: 09/18/2021 °CLINICAL DATA:  Metastatic right squamous cell lung cancer restaging, assess treatment response, ongoing chemotherapy EXAM: CT CHEST, ABDOMEN, AND PELVIS WITH CONTRAST TECHNIQUE: Multidetector CT imaging of the chest, abdomen and pelvis was performed following the standard protocol during bolus administration of intravenous contrast. CONTRAST:  80mL OMNIPAQUE IOHEXOL 350 MG/ML SOLN, additional oral enteric contrast COMPARISON:  06/21/2021 FINDINGS: CT CHEST FINDINGS Cardiovascular: Aortic atherosclerosis. Right chest port catheter. Normal heart size. Scattered left coronary artery calcifications. No pericardial effusion. Mediastinum/Nodes: Unchanged post treatment appearance of subcarinal and right hilar soft tissue (series 2, image 29). No discretely enlarged mediastinal, hilar, or axillary lymph nodes. Thyroid gland, trachea, and esophagus demonstrate no significant findings. Lungs/Pleura: Unchanged post treatment/post radiation fibrosis and consolidation of the medial right lower lobe. Numerous small solid and ground-glass nodules scattered throughout the lungs are unchanged, again most concentrated in the lung apices (series 4, image 28, 40, 32). No pleural effusion or pneumothorax. Musculoskeletal: No  chest wall mass. CT ABDOMEN PELVIS FINDINGS Hepatobiliary: No focal liver abnormality is seen. Status post cholecystectomy. No biliary dilatation. Pancreas: Unremarkable. No pancreatic ductal dilatation or surrounding inflammatory changes. Spleen: Normal in size without significant abnormality. Adrenals/Urinary Tract: Adrenal glands are unremarkable. Kidneys are normal, without renal calculi, solid lesion, or hydronephrosis. Bladder is unremarkable. Stomach/Bowel: Stomach is within normal limits. Appendix appears normal. No evidence of bowel wall thickening, distention, or inflammatory changes. Moderate burden of stool throughout the colon and rectum. Vascular/Lymphatic: Aortic atherosclerosis. No enlarged abdominal or pelvic lymph nodes. Reproductive: Status post hysterectomy. Other: No abdominal wall hernia or abnormality. No ascites. Musculoskeletal: No acute osseous findings. Status post vertebral cement augmentation of T8 (series 6, image 94). Unchanged sclerotic lesions of the left and right ilia (series 2, image 91). IMPRESSION: 1. Unchanged post treatment/post radiation fibrosis and consolidation of the medial right lower lobe. 2. Unchanged post treatment appearance of subcarinal and right hilar soft tissue. No discretely enlarged lymph nodes. 3. Numerous small solid and ground-glass nodules scattered throughout the lungs are unchanged. 4. Unchanged sclerotic osseous metastatic lesions. Status post vertebral cement augmentation of T8. 5. No evidence of new metastatic disease in the chest, abdomen, or pelvis. 6. Coronary artery disease. Aortic Atherosclerosis (ICD10-I70.0). Electronically Signed   By: Alex D Bibbey M.D.   On: 09/18/2021 18:12   ° ° °Assessment/Plan °Brain metastases (HCC) ° °Radiation therapy induced brain necrosis  ° °Ms. Valek is actually clinically stable today, after isolated day of decline yesterday.  Her exam is stable compared to prior visits. ° °We recommended trial of meclizine 25mg  or 50mg (if needed) PRN for severe vertigo.  Additionally, she could dose 4mg of decadron if that is ineffective. ° °We appreciate the opportunity   to participate in the care of Virginia [name].  ° °We ask that Virginia [name] return to clinic in 1 months following next brain MRI, or sooner as needed given changes noted today. ° °All questions were answered. The patient knows to call the clinic with any problems, questions or concerns. No barriers to learning were detected. ° °The total time spent in the encounter was 30 minutes and more than 50% was on counseling and review of test results ° ° ° K , MD °Medical Director of Neuro-Oncology ° [facility] °09/28/21 2:40 PM °

## 2021-10-01 ENCOUNTER — Encounter: Payer: Self-pay | Admitting: Internal Medicine

## 2021-10-09 ENCOUNTER — Other Ambulatory Visit: Payer: Self-pay | Admitting: Radiation Therapy

## 2021-10-10 ENCOUNTER — Other Ambulatory Visit: Payer: Self-pay | Admitting: Hematology and Oncology

## 2021-10-10 ENCOUNTER — Other Ambulatory Visit (HOSPITAL_COMMUNITY): Payer: Self-pay

## 2021-10-10 ENCOUNTER — Telehealth: Payer: Self-pay | Admitting: *Deleted

## 2021-10-10 NOTE — Telephone Encounter (Signed)
Husband called to see if Dr Lorenso Courier will send in a refill of furosemide 20 mg TWO times a day instead of daily. Is requesting a 90 day refill to [facility] on [address].

## 2021-10-11 ENCOUNTER — Other Ambulatory Visit: Payer: Self-pay | Admitting: Hematology and Oncology

## 2021-10-11 ENCOUNTER — Encounter: Payer: Self-pay | Admitting: Hematology and Oncology

## 2021-10-11 ENCOUNTER — Encounter: Payer: Self-pay | Admitting: Internal Medicine

## 2021-10-11 MED ORDER — FUROSEMIDE 20 MG PO TABS: 20.0000 mg | ORAL_TABLET | Freq: Two times a day (BID) | ORAL | 1 refills | Status: DC

## 2021-10-16 ENCOUNTER — Other Ambulatory Visit (HOSPITAL_COMMUNITY): Payer: Self-pay

## 2021-10-16 ENCOUNTER — Other Ambulatory Visit: Payer: Self-pay | Admitting: Hematology and Oncology

## 2021-10-16 MED ORDER — FOLIC ACID 1 MG PO TABS
ORAL_TABLET | Freq: Every day | ORAL | 1 refills | Status: DC
  Filled 2021-10-16: qty 90, 90d supply, fill #0
  Filled 2022-01-08: qty 90, 90d supply, fill #1

## 2021-10-20 ENCOUNTER — Encounter: Payer: Self-pay | Admitting: Hematology and Oncology

## 2021-10-20 ENCOUNTER — Inpatient Hospital Stay: Payer: BC Managed Care – PPO | Attending: Internal Medicine

## 2021-10-20 ENCOUNTER — Inpatient Hospital Stay: Payer: BC Managed Care – PPO

## 2021-10-20 ENCOUNTER — Inpatient Hospital Stay: Payer: BC Managed Care – PPO | Admitting: Hematology and Oncology

## 2021-10-20 ENCOUNTER — Other Ambulatory Visit: Payer: Self-pay

## 2021-10-20 VITALS — BP 136/98 | HR 93 | Temp 97.5°F | Resp 18 | Ht 62.0 in | Wt 169.5 lb

## 2021-10-20 DIAGNOSIS — Z9049 Acquired absence of other specified parts of digestive tract: Secondary | ICD-10-CM | POA: Insufficient documentation

## 2021-10-20 DIAGNOSIS — Z5111 Encounter for antineoplastic chemotherapy: Secondary | ICD-10-CM | POA: Insufficient documentation

## 2021-10-20 DIAGNOSIS — R0683 Snoring: Secondary | ICD-10-CM | POA: Diagnosis not present

## 2021-10-20 DIAGNOSIS — C7931 Secondary malignant neoplasm of brain: Secondary | ICD-10-CM | POA: Diagnosis not present

## 2021-10-20 DIAGNOSIS — Z809 Family history of malignant neoplasm, unspecified: Secondary | ICD-10-CM | POA: Insufficient documentation

## 2021-10-20 DIAGNOSIS — I6789 Other cerebrovascular disease: Secondary | ICD-10-CM | POA: Diagnosis not present

## 2021-10-20 DIAGNOSIS — Y842 Radiological procedure and radiotherapy as the cause of abnormal reaction of the patient, or of later complication, without mention of misadventure at the time of the procedure: Secondary | ICD-10-CM

## 2021-10-20 DIAGNOSIS — Z79899 Other long term (current) drug therapy: Secondary | ICD-10-CM | POA: Insufficient documentation

## 2021-10-20 DIAGNOSIS — Z95828 Presence of other vascular implants and grafts: Secondary | ICD-10-CM

## 2021-10-20 DIAGNOSIS — Z8249 Family history of ischemic heart disease and other diseases of the circulatory system: Secondary | ICD-10-CM | POA: Diagnosis not present

## 2021-10-20 DIAGNOSIS — R42 Dizziness and giddiness: Secondary | ICD-10-CM | POA: Insufficient documentation

## 2021-10-20 DIAGNOSIS — Z814 Family history of other substance abuse and dependence: Secondary | ICD-10-CM | POA: Insufficient documentation

## 2021-10-20 DIAGNOSIS — C7951 Secondary malignant neoplasm of bone: Secondary | ICD-10-CM

## 2021-10-20 DIAGNOSIS — I10 Essential (primary) hypertension: Secondary | ICD-10-CM | POA: Insufficient documentation

## 2021-10-20 DIAGNOSIS — R918 Other nonspecific abnormal finding of lung field: Secondary | ICD-10-CM

## 2021-10-20 DIAGNOSIS — C3490 Malignant neoplasm of unspecified part of unspecified bronchus or lung: Secondary | ICD-10-CM | POA: Diagnosis not present

## 2021-10-20 DIAGNOSIS — C3431 Malignant neoplasm of lower lobe, right bronchus or lung: Secondary | ICD-10-CM | POA: Insufficient documentation

## 2021-10-20 DIAGNOSIS — Z5112 Encounter for antineoplastic immunotherapy: Secondary | ICD-10-CM | POA: Insufficient documentation

## 2021-10-20 DIAGNOSIS — E876 Hypokalemia: Secondary | ICD-10-CM | POA: Diagnosis not present

## 2021-10-20 DIAGNOSIS — Z86718 Personal history of other venous thrombosis and embolism: Secondary | ICD-10-CM | POA: Insufficient documentation

## 2021-10-20 LAB — CBC WITH DIFFERENTIAL (CANCER CENTER ONLY)
Abs Immature Granulocytes: 0.14 10*3/uL — ABNORMAL HIGH (ref 0.00–0.07)
Basophils Absolute: 0.1 10*3/uL (ref 0.0–0.1)
Basophils Relative: 1 %
Eosinophils Absolute: 0.1 10*3/uL (ref 0.0–0.5)
Eosinophils Relative: 1 %
HCT: 36 % (ref 36.0–46.0)
Hemoglobin: 12.8 g/dL (ref 12.0–15.0)
Immature Granulocytes: 2 %
Lymphocytes Relative: 16 %
Lymphs Abs: 1.3 10*3/uL (ref 0.7–4.0)
MCH: 34.7 pg — ABNORMAL HIGH (ref 26.0–34.0)
MCHC: 35.6 g/dL (ref 30.0–36.0)
MCV: 97.6 fL (ref 80.0–100.0)
Monocytes Absolute: 0.9 10*3/uL (ref 0.1–1.0)
Monocytes Relative: 12 %
Neutro Abs: 5.4 10*3/uL (ref 1.7–7.7)
Neutrophils Relative %: 68 %
Platelet Count: 306 10*3/uL (ref 150–400)
RBC: 3.69 MIL/uL — ABNORMAL LOW (ref 3.87–5.11)
RDW: 14.5 % (ref 11.5–15.5)
WBC Count: 7.8 10*3/uL (ref 4.0–10.5)
nRBC: 0 % (ref 0.0–0.2)

## 2021-10-20 LAB — CMP (CANCER CENTER ONLY)
ALT: 23 U/L (ref 0–44)
AST: 18 U/L (ref 15–41)
Albumin: 3.7 g/dL (ref 3.5–5.0)
Alkaline Phosphatase: 69 U/L (ref 38–126)
Anion gap: 9 (ref 5–15)
BUN: 9 mg/dL (ref 8–23)
CO2: 29 mmol/L (ref 22–32)
Calcium: 8.6 mg/dL — ABNORMAL LOW (ref 8.9–10.3)
Chloride: 102 mmol/L (ref 98–111)
Creatinine: 0.69 mg/dL (ref 0.44–1.00)
GFR, Estimated: >60 mL/min (ref >60)
Glucose, Bld: 150 mg/dL — ABNORMAL HIGH (ref 70–99)
Potassium: 3.3 mmol/L — ABNORMAL LOW (ref 3.5–5.1)
Sodium: 140 mmol/L (ref 135–145)
Total Bilirubin: 0.5 mg/dL (ref 0.3–1.2)
Total Protein: 6.8 g/dL (ref 6.5–8.1)

## 2021-10-20 LAB — TOTAL PROTEIN, URINE DIPSTICK: Protein, ur: NEGATIVE mg/dL

## 2021-10-20 LAB — LACTATE DEHYDROGENASE: LDH: 205 U/L — ABNORMAL HIGH (ref 98–192)

## 2021-10-20 MED ORDER — SODIUM CHLORIDE 0.9% FLUSH
10.0000 mL | INTRAVENOUS | Status: DC | PRN
  Administered 2021-10-20: 10 mL

## 2021-10-20 MED ORDER — PROCHLORPERAZINE MALEATE 10 MG PO TABS
10.0000 mg | ORAL_TABLET | Freq: Once | ORAL | Status: AC
  Administered 2021-10-20: 10 mg via ORAL
  Filled 2021-10-20: qty 1

## 2021-10-20 MED ORDER — SODIUM CHLORIDE 0.9 % IV SOLN
200.0000 mg | Freq: Once | INTRAVENOUS | Status: AC
  Administered 2021-10-20: 200 mg via INTRAVENOUS
  Filled 2021-10-20: qty 200

## 2021-10-20 MED ORDER — SODIUM CHLORIDE 0.9 % IV SOLN
10.0000 mg/kg | Freq: Once | INTRAVENOUS | Status: AC
  Administered 2021-10-20: 800 mg via INTRAVENOUS
  Filled 2021-10-20: qty 32

## 2021-10-20 MED ORDER — ZOLEDRONIC ACID 4 MG/100ML IV SOLN
4.0000 mg | Freq: Once | INTRAVENOUS | Status: AC
  Administered 2021-10-20: 4 mg via INTRAVENOUS
  Filled 2021-10-20: qty 100

## 2021-10-20 MED ORDER — SODIUM CHLORIDE 0.9 % IV SOLN
500.0000 mg/m2 | Freq: Once | INTRAVENOUS | Status: AC
  Administered 2021-10-20: 900 mg via INTRAVENOUS
  Filled 2021-10-20: qty 20

## 2021-10-20 MED ORDER — HEPARIN SOD (PORK) LOCK FLUSH 100 UNIT/ML IV SOLN
500.0000 [IU] | Freq: Once | INTRAVENOUS | Status: AC | PRN
  Administered 2021-10-20: 500 [IU]

## 2021-10-20 MED ORDER — SODIUM CHLORIDE 0.9 % IV SOLN: Freq: Once | INTRAVENOUS | Status: DC

## 2021-10-20 MED ORDER — SODIUM CHLORIDE 0.9 % IV SOLN: Freq: Once | INTRAVENOUS | Status: AC

## 2021-10-20 MED ORDER — SODIUM CHLORIDE 0.9% FLUSH
10.0000 mL | INTRAVENOUS | Status: AC | PRN
  Administered 2021-10-20: 10 mL

## 2021-10-20 NOTE — Progress Notes (Signed)
Pt has been re enrolled w/ the [employer] program for [place] for $25,000 from 09/10/21 - 09/09/22.  Her copay for Beryle Flock will be $25.

## 2021-10-20 NOTE — Progress Notes (Signed)
Ok to treat today with zometa per Dr. Lorenso Courier with corrected calcium 8.84.

## 2021-10-21 ENCOUNTER — Encounter: Payer: Self-pay | Admitting: Internal Medicine

## 2021-10-21 ENCOUNTER — Encounter: Payer: Self-pay | Admitting: Hematology and Oncology

## 2021-10-21 MED ORDER — XTAMPZA ER 13.5 MG PO C12A
1.0000 | EXTENDED_RELEASE_CAPSULE | Freq: Two times a day (BID) | ORAL | 0 refills | Status: DC
  Filled 2021-10-21: qty 60, 30d supply, fill #0

## 2021-10-21 MED ORDER — OXYCODONE HCL 5 MG PO TABS
ORAL_TABLET | ORAL | 0 refills | Status: DC
  Filled 2021-10-21: qty 90, 8d supply, fill #0

## 2021-10-21 NOTE — Progress Notes (Signed)
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 CT of the head showed showed concerning for metastatic disease involving the left cerebellum and right occipital lobe.  2) 09/30/2019: MRI brain confirms mass within the left cerebellum measures 1.6 x 1.3 x 1.5 cm as well as at least 8 metastatic lesions within the supratentorial and infratentorial brain as outlined 3) 10/01/2019: PET CT scan revealed hypermetabolic infrahilar right lower lobe mass with hypermetabolic nodal metastases in the right hilum, mediastinum, right supraclavicular region, left axilla and lower left neck. 4) 10/08/2019: US guided biopsy of the lymph nodes confirms poorly differentiated non-small cell carcinoma. Immunohistochemistry confirms adenocarcinoma of lung primary. PD-L1 and NGS later revealed a MET Exon 14 mutation and TPS of 90%.  5) 10/12/2019: Establish care with Dr. Lorenso Courier  6) 11/04/2019: Day 1 of capmatinib $RemoveBefor'400mg'[id number]$  BID 7) 11/09/2019: presented to Rad/Onc with a DVT in LLE. Seen in symptom management clinic and started on apixaban.  8) 12/29/2019: CT C/A/P showed interval decrease in size of mass involving the superior segment of right lower lobe and reduction in mediastinal and left supraclavicular adenopathy though some small spinal lesions were noted in the interim between the two sets of scans 9) 01/25/2020: temporarily stopped capmatinib due to symptoms of nausea/fatigue. Restarted therapy on 01/30/2020 after brief chemo holiday.  10) 03/22/2020: CT C/A/P reveals a mixed picture, with new lung nodule and  increased lymphadenopathy, however response in bone lesions. D/c capmatinib therapy 11) 04/08/2020: start of Carbo/Pem/Pem for 2nd line treatment. Cycle 1 Day 1   12) 04/29/2020: Cycle 2 Day 1 Carbo/Pem/Pem  13) 05/20/2020: Cycle 3 Day 1 Carbo/Pem/Pem  14) 06/09/2020: Cycle 4 Day 1 Carbo/Pem/Pem  15) 06/29/2020: Cycle 5 Day 1 maintenance Pem/Pem  16) 07/20/2020: Cycle 6 Day 1 maintenance Pem/Pem  17) 08/12/2020: Cycle 7 Day 1 maintenance Pem/Pem  18) 09/01/2020: Cycle 8 Day 1 maintenance Pem/Pem  19) 09/21/2020: Cycle 9 Day 1 maintenance Pem/Pem  20) 10/13/2020: Cycle 10 Day 1 maintenance Pem/Pem  21) 11/03/2020: Cycle 11 Day 1 maintenance Pem/Pem  22) 11/25/2020:  Cycle 12 Day 1 maintenance Pem/Pem 23) 12/15/2020:  Cycle 13 Day 1 maintenance Pem/Pem 24) 01/05/2021:  Cycle 14 Day 1 maintenance Pem/Pem 25) 01/26/2021:  Cycle 15 Day 1 maintenance Pem/Pem plus Bevacizumab.  02/17/2021: Cycle 16 Day 1 maintenance Pem/Pem plus Bevacizumab.  03/10/2021: Cycle 17 Day 1 maintenance Pem/Pem plus Bevacizumab.  03/31/2021: Cycle 18 Day 1 maintenance Pem/Pem  04/20/2021:  Cycle 19 Day 1 maintenance Pem/Pem  05/12/2021: Cycle 20 Day 1 maintenance Pem/Pem 06/01/2021: Cycle 21 Day 1 maintenance Pem/Pem 06/22/2021: Cycle 22 Day 1 maintenance Pem/Pem 07/13/2021: Cycle 23 Day 1 maintenance Pem/Pem 08/16/2021: Cycle 24 Day 1 maintenance Pem/Pem 09/08/2021: Cycle 25 Day 1 maintenance Pem/Pem plus Bevacizumab 09/28/2021: Cycle 26 Day 1 maintenance Pem/Pem plus Bevacizumab 10/20/2021: Cycle 27 Day 1 maintenance Pem/Pem plus Bevacizumab  Interval History:  Virginia [name] 66 y.o. female with medical history significant for metastatic adenocarcinoma of the lung presents for a follow up visit. The patient's last visit was on 09/28/2021.  In the interim since the last visit, she has continued pem/pem maintenance.  Today  is Cycle 27 Day 1.   On exam today Virginia [name] is accompanied by her husband.  She reports she tolerated the last  cycle of chemotherapy well but unfortunately has continued dizziness.  Ever since the dizziness increased in November it has remained present.  She notes that steroids and Avastin unfortunately have not helped.  She is not having any issues with nausea, vomiting, or diarrhea fortunately.  She notes that the swelling in her lower extremities is improved and her appetite is quite strong.  Her pain is also under excellent control noting that while at rest her pain is a 0 out of 10 in severity but can get up to a 7 or 8 out of 10.  She notes that while working he can go up to 10 out of 10.  She denies any fevers, chills, night sweats, chest pain, cough, rash or neuropathy. She has no other complaints. Rest of the 10 point ROS is below.  She is willing and able to proceed with treatment at this time.  MEDICAL HISTORY:  Past Medical History:  Diagnosis Date   Anemia    Anxiety    Concussion 09/28/2019   DVT (deep venous thrombosis) ([place]) 2021   L leg   Dyspnea    GERD (gastroesophageal reflux disease)    Hypercholesterolemia    per pt, she does not have elevated lipids   Hypertension    met lung ca dx'd 09/2019   mets to spine, hip and brain   PONV (postoperative nausea and vomiting)    Tobacco abuse     SURGICAL HISTORY: Past Surgical History:  Procedure Laterality Date   ABDOMINAL HYSTERECTOMY     partial/ left ovaries   APPLICATION OF CRANIAL NAVIGATION N/A 12/02/2020   Procedure: APPLICATION OF CRANIAL NAVIGATION;  Surgeon: Erline Levine, MD;  Location: [place];  Service: Neurosurgery;  Laterality: N/A;   CHOLECYSTECTOMY     CRANIOTOMY N/A 12/02/2020   Procedure: Posterior fossa craniotomy for tumor resection with brainlab;  Surgeon: Erline Levine, MD;  Location: [place];  Service: Neurosurgery;  Laterality: N/A;   DILATION AND CURETTAGE OF UTERUS     IR IMAGING GUIDED PORT INSERTION  10/23/2019   KYPHOPLASTY N/A 03/15/2020   Procedure: Thoracic Eight KYPHOPLASTY;  Surgeon: Erline Levine, MD;   Location: [place];  Service: Neurosurgery;  Laterality: N/A;  prone    LIPOMA EXCISION  2018   removed under left breast and right thigh.   TUBAL LIGATION      ALLERGIES:  has No Known Allergies.  MEDICATIONS:  Current Outpatient Medications  Medication Sig Dispense Refill   acetaminophen (TYLENOL) 500 MG tablet Take 1,000 mg by mouth every 8 (eight) hours as needed for moderate pain.     albuterol (VENTOLIN HFA) 108 (90 Base) MCG/ACT inhaler INHALE 2 PUFFS INTO THE LUNGS EVERY 6 HOURS AS NEEDED FOR WHEEZING OR SHORTNESS OF BREATH (Patient taking differently: Inhale 2 puffs into the lungs every 6 (six) hours as needed for shortness of breath or wheezing.) 6.7 g 11   ALPRAZolam (XANAX) 0.25 MG tablet Take 1 tablet (0.25 mg total) by mouth 2 (two) times daily as needed for anxiety 60 tablet 1   amLODipine (NORVASC) 10 MG tablet TAKE 1 TABLET BY MOUTH EVERY DAY 90 tablet 1   Cyanocobalamin (VITAMIN B12 PO) Take 2 tablets by mouth daily. Gummies     cyclobenzaprine (FLEXERIL) 5 MG tablet Take 5 mg by mouth at bedtime.     dexamethasone (DECADRON) 1  MG tablet Take 2 tablets by mouth daily. 60 tablet 1   folic acid (FOLVITE) 1 MG tablet Take 1 tablet by mouth daily. 90 tablet 1   furosemide (LASIX) 20 MG tablet Take 1 tablet (20 mg total) by mouth 2 (two) times daily. 180 tablet 1   lidocaine-prilocaine (EMLA) cream Apply to port as needed. 30 g 0   meclizine (ANTIVERT) 25 MG tablet Take 1 tablet (25 mg total) by mouth 3 (three) times daily as needed for dizziness. 30 tablet 0   Multiple Vitamins-Minerals (AIRBORNE PO) Take 1 tablet by mouth daily.      ofloxacin (OCUFLOX) 0.3 % ophthalmic solution 2 drops each eye 4 times a day for 5 days (Patient taking differently: Place 2 drops into both eyes daily as needed (irritation).) 5 mL 1   OLANZapine (ZYPREXA) 2.5 MG tablet TAKE 1 TABLET BY MOUTH EVERY NIGHT AT BEDTIME 90 tablet 1   ondansetron (ZOFRAN) 4 MG tablet TAKE 1 TO 2 TABLETS BY MOUTH 2 TIMES  DAILY AS NEEDED 60 tablet 0   oxyCODONE (OXY IR/ROXICODONE) 5 MG immediate release tablet TAKE 1 TO 2 TABLETS BY MOUTH EVERY 4 HOURS AS NEEDED FOR SEVERE PAIN 90 tablet 0   pantoprazole (PROTONIX) 40 MG tablet TAKE 1 TABLET BY MOUTH ONCE DAILY 90 tablet 3   pentoxifylline (TRENTAL) 400 MG CR tablet TAKE 1 TABLET BY MOUTH WITH FOOD FOR 1 WEEK THEN TAKE 1 TABLET BY MOUTH 2 TIMES DAILY THEREAFTER 60 tablet 2   polyethylene glycol (MIRALAX / GLYCOLAX) 17 g packet Take 17 g by mouth 2 (two) times daily.      potassium chloride SA (KLOR-CON M) 20 MEQ tablet Take 1 tablet by mouth 2  times daily. 60 tablet 2   prochlorperazine (COMPAZINE) 10 MG tablet Take 10 mg by mouth every 6 (six) hours as needed for nausea or vomiting.     promethazine (PHENERGAN) 12.5 MG tablet Take 1 to 2 tablets by mouth every 6 hours as needed for nausea 20 tablet 0   Vitamin D3 (VITAMIN D) 25 MCG tablet Take 2,000 Units by mouth daily.     vitamin E 180 MG (400 UNITS) capsule Take 400 IU daily x 1 week, then 400 IU BID (Patient taking differently: Take 400 Units by mouth in the morning and at bedtime. Take 400 IU daily x 1 week, then 400 IU BID) 60 capsule 4   XTAMPZA ER 13.5 MG C12A Take 1 capsule by mouth in the morning and at bedtime. (every 12 hours) 60 capsule 0   No current facility-administered medications for this visit.    REVIEW OF SYSTEMS:   Constitutional: ( - ) fevers, ( - )  chills , ( - ) night sweats Eyes: ( - ) blurriness of vision, ( - ) double vision, ( - ) watery eyes Ears, nose, mouth, throat, and face: ( - ) mucositis, ( - ) sore throat Respiratory: ( - ) cough, ( - ) dyspnea, ( - ) wheezes Cardiovascular: ( - ) palpitation, ( - ) chest discomfort, ( - ) lower extremity swelling Gastrointestinal:  ( - ) nausea, ( - ) heartburn, ( - ) change in bowel habits Skin: ( - ) abnormal skin rashes Lymphatics: ( - ) new lymphadenopathy, ( - ) easy bruising Neurological: ( - ) numbness, ( - ) tingling, ( - ) new  weaknesses Behavioral/Psych: ( - ) mood change, ( - ) new changes  All other systems were reviewed with  the patient and are negative.  PHYSICAL EXAMINATION: ECOG PERFORMANCE STATUS: 1 - Symptomatic but completely ambulatory  Vitals:   10/20/21 1138  BP: (!) 136/98  Pulse: 93  Resp: 18  Temp: (!) 97.5 F (36.4 C)  SpO2: 98%    Filed Weights   10/20/21 1138  Weight: 169 lb 8 oz (76.9 kg)     GENERAL: well appearing middle aged Caucasian female in NAD  SKIN: skin color, texture, turgor are normal, no rashes or significant lesions EYES: conjunctiva are pink and non-injected, sclera clear LUNGS: wheezing at lung bases. clear to auscultation and percussion with normal breathing effort HEART: regular rate & rhythm and no murmurs and bilateral trace pitting lower extremity edema Musculoskeletal: no cyanosis of digits and no clubbing PSYCH: alert & oriented x 3, fluent speech NEURO: no focal motor/sensory deficits    LABORATORY DATA:  I have reviewed the data as listed CBC Latest Ref Rng & Units 10/20/2021 09/28/2021 09/08/2021  WBC 4.0 - 10.5 K/uL 7.8 10.9(H) 10.5  Hemoglobin 12.0 - 15.0 g/dL 12.8 13.6 12.6  Hematocrit 36.0 - 46.0 % 36.0 37.4 35.1(L)  Platelets 150 - 400 K/uL 306 335 350    CMP Latest Ref Rng & Units 10/20/2021 09/28/2021 09/08/2021  Glucose 70 - 99 mg/dL 150(H) 130(H) 100(H)  BUN 8 - 23 mg/dL _0 Creatinine 0.44 - 1.00 mg/dL 0.69 0.92 0.90  Sodium 135 - 145 mmol/L 140 141 139  Potassium 3.5 - 5.1 mmol/L 3.3(L) 3.3(L) 3.0(L)  Chloride 98 - 111 mmol/L 102 102 101  CO2 22 - 32 mmol/L _1 Calcium 8.9 - 10.3 mg/dL 8.6(L) 8.9 9.0  Total Protein 6.5 - 8.1 g/dL 6.8 7.1 6.8  Total Bilirubin 0.3 - 1.2 mg/dL 0.5 0.3 0.4  Alkaline Phos 38 - 126 U/L 69 70 50  AST 15 - 41 U/L 18 14(L) 15  ALT 0 - 44 U/L _2 RADIOGRAPHIC STUDIES: I personally have viewed the radiographic studies below: No results found.   ASSESSMENT & PLAN Virginia [name]  66 y.o. female with medical history significant for metastatic adenocarcinoma of the lung presents for a follow up visit. 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   Previously we discussed Carboplatin/Pembrolizumab/Pemetrexed and the expected side effect from these medications.  We discussed the possible side effects of immunotherapy including colitis, hepatitis, dermatitis, and pneumonitis.  Additionally we discussed the side effects of carboplatin which include suppression of blood counts, nephrotoxicity, and nausea.  Additionally we discussed pemetrexed which can also have effects on counts and would require supplementation with vitamin G81 and folic acid.  She voiced her understanding of these side effects and treatment plans moving forward.  I also noted that we would be able to drop the chemotherapy agents that she had intolerance to continue with pembrolizumab alone given her high TPS score.  The patient has a markedly elevated TPS score (90%)   Virginia [name] continues to tolerate the treatment. Her labs from today were reviewed without any intervention needed. Patient will proceed with treatment as planned and return to the clinic in 3 weeks prior to her next cycle.   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 Due to worsening dizziness, she will resumed this with  Cycle 25.  --patient will be followed every 3 weeks with labs and clinic visit --repeat CT CAP on 09/18/2021 showed stable disease with no evidence of progression. Next due in April 2023.  --supportive therapy as listed below.  --RTC q 3 weeks for continued monitoring/chemotherapy treatments.    #Nausea/Vomiting, improving -- provided patient with Zofran 4-8mg  q8h PRN and compazine 10mg  q6H for breakthrough PRN.  She is  currently using Phenergan for breakthrough. --continue olanzpaine 2.5 mg PO QHS to help with nausea. Marked improvement since the addition of this medication on 01/29/2020.  --continue to monitor   #Hypokalemia, stable --likely 2/2 to decreased PO intake, hypovolemia, and BP medications --Potassium level 3.3 today.   --Currently taking 20 mEq PO potassium daily. Recommend to increase to twice daily  #Pain Control, stable --continue oxycodone 5mg  q4H PRN. Can increase dose to 10mg  q6H if pain is severe. She is taking approximately 2-3 pills per day.  --continue Xtampza ER BID for long acting support.  --continue to take with Senokot to prevent opioid induced constipation. 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 Last 07/13/2021, next due in Feb 2023.  --nausea as above    #Brain Metastasis, stable --appreciate the assistance of Dr. 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 --radiation therapy to the brain completed on 11/16/2019.  --MRI brain on 04/29/2020 showed evidence of small brain bleed. Eliquis therapy was discontinued.  --MRI on 11/04/2020 showed concern for progression of intracranial disease.  --On 12/02/2020, patient underwent craniotomy, resection of progressive cerebellar mass by Dr. Vertell Limber. 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  --MRI on 12/23/2020 showed evidence of radiation necrosis. Dr. Mickeal Skinner evaluated the patient on 01/02/2021 with recommendations to add IV Bevacizumab 10 mg/kg q 3 weeks ( started 01/26/2021). Completed a full course of this therapy. --05/26/2021: MRI brain showed no substantial changes.  --Patient was evaluated by Dr. Mickeal Skinner on 08/29/2021 and recommended to resume Bevacizumab due to worsening dizziness.   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    # Hypertension: --BP at today's visit was 141/102. Continue Norvasc to 10 mg daily.  --continue to monitor at home and in clinic.    Orders Placed This Encounter  Procedures   Polysomnography 4 or more parameters    Standing Status:   Future    Standing Expiration Date:   10/21/2022    Order Specific Question:   Where should this test be performed:    Answer:   [place]    All questions were answered. The patient knows to call the clinic with any problems, questions or concerns.  I have spent a total of 30 minutes minutes of face-to-face and non-face-to-face time, preparing to see the patient, performing a medically appropriate examination, counseling and educating the patient,  documenting clinical information in the electronic health record, and care coordination.    Orson Slick, MD Department of Hematology/Oncology [facility] at [facility] Phone: [phone]   10/21/2021 5:32 PM   Literature Support:  Lorenza Chick, Clearence Cheek, Eduardo Osier E, Felip E, Lowell F, Domine M, [place], Hochmair MJ, [place], Cheng SY, Bischoff HG, [place], Grossi F, [place], Reck M, [place], Garon EB, Boyer M, Rubio-Viqueira B, Novello S, Kurata  Stark Bray JE, Jenkins [place], Raftopoulos H, [place], [place] MC; [phone] Investigators. Pembrolizumab plus Chemotherapy in Metastatic Non-Small-Cell Lung Cancer. [place] [place] Med. 2018 May 31;378(22):2078-2092.  --Median progression-free survival was 8.8 months (95% CI, 7.6 to 9.2) in the pembrolizumab-combination group and 4.9 months (95% CI, 4.7 to 5.5) in the placebo-combination group (hazard ratio for disease progression or death, 0.52; 95% CI, 0.43 to 0.64; P<0.001). Adverse events of grade 3 or higher occurred in 67.2% of the patients in the pembrolizumab-combination group and in 65.8% of those in the placebo-combination group.  Jodene Nam L. Efficacy of pemetrexed-based regimens in advanced non-small cell lung cancer patients with activating epidermal growth factor receptor mutations after tyrosine kinase inhibitor failure: a systematic review. Onco Targets Ther. 2018;11:2121-2129.   --The weighted median PFS, median OS, and ORR for patients treated with pem regimens were 5.09 months, 15.91 months, and 30.19%, respectively. Our systematic review results showed a favorable efficacy profile of pem regimens in NSCLC patients with EGFR mutation after EGFR-TKI failure.

## 2021-10-23 ENCOUNTER — Other Ambulatory Visit (HOSPITAL_COMMUNITY): Payer: Self-pay

## 2021-10-23 ENCOUNTER — Encounter: Payer: Self-pay | Admitting: Hematology and Oncology

## 2021-10-23 NOTE — Progress Notes (Signed)
Pt has been re enrolled w/ the [employer] program for [place] for $25,000 from 09/10/21 - 09/09/22.  Her copay for Beryle Flock will be $25.

## 2021-10-25 ENCOUNTER — Other Ambulatory Visit: Payer: Self-pay | Admitting: *Deleted

## 2021-10-25 DIAGNOSIS — R0683 Snoring: Secondary | ICD-10-CM

## 2021-10-27 ENCOUNTER — Other Ambulatory Visit: Payer: Self-pay

## 2021-10-27 ENCOUNTER — Ambulatory Visit
Admission: RE | Admit: 2021-10-27 | Discharge: 2021-10-27 | Disposition: A | Discharge status: Home / Self Care | Payer: BC Managed Care – PPO | Source: Ambulatory Visit | Attending: Internal Medicine | Admitting: Internal Medicine

## 2021-10-27 DIAGNOSIS — C719 Malignant neoplasm of brain, unspecified: Secondary | ICD-10-CM | POA: Diagnosis not present

## 2021-10-27 DIAGNOSIS — C7931 Secondary malignant neoplasm of brain: Secondary | ICD-10-CM

## 2021-10-27 DIAGNOSIS — C3490 Malignant neoplasm of unspecified part of unspecified bronchus or lung: Secondary | ICD-10-CM | POA: Diagnosis not present

## 2021-10-27 MED ORDER — GADOBENATE DIMEGLUMINE 529 MG/ML IV SOLN
15.0000 mL | Freq: Once | INTRAVENOUS | Status: AC | PRN
  Administered 2021-10-27: 15 mL via INTRAVENOUS

## 2021-10-30 ENCOUNTER — Inpatient Hospital Stay: Payer: BC Managed Care – PPO

## 2021-10-31 ENCOUNTER — Other Ambulatory Visit: Payer: Self-pay

## 2021-10-31 ENCOUNTER — Other Ambulatory Visit (HOSPITAL_COMMUNITY): Payer: Self-pay

## 2021-10-31 ENCOUNTER — Other Ambulatory Visit: Payer: Self-pay | Admitting: Internal Medicine

## 2021-10-31 ENCOUNTER — Telehealth: Payer: Self-pay

## 2021-10-31 ENCOUNTER — Inpatient Hospital Stay: Payer: BC Managed Care – PPO | Admitting: Internal Medicine

## 2021-10-31 VITALS — BP 132/100 | HR 107 | Temp 97.8°F | Resp 18 | Ht 62.0 in | Wt 163.1 lb

## 2021-10-31 DIAGNOSIS — Z8249 Family history of ischemic heart disease and other diseases of the circulatory system: Secondary | ICD-10-CM | POA: Diagnosis not present

## 2021-10-31 DIAGNOSIS — Z86718 Personal history of other venous thrombosis and embolism: Secondary | ICD-10-CM | POA: Diagnosis not present

## 2021-10-31 DIAGNOSIS — R42 Dizziness and giddiness: Secondary | ICD-10-CM | POA: Diagnosis not present

## 2021-10-31 DIAGNOSIS — Z5112 Encounter for antineoplastic immunotherapy: Secondary | ICD-10-CM | POA: Diagnosis not present

## 2021-10-31 DIAGNOSIS — Z9049 Acquired absence of other specified parts of digestive tract: Secondary | ICD-10-CM | POA: Diagnosis not present

## 2021-10-31 DIAGNOSIS — C7931 Secondary malignant neoplasm of brain: Secondary | ICD-10-CM | POA: Diagnosis not present

## 2021-10-31 DIAGNOSIS — Y842 Radiological procedure and radiotherapy as the cause of abnormal reaction of the patient, or of later complication, without mention of misadventure at the time of the procedure: Secondary | ICD-10-CM

## 2021-10-31 DIAGNOSIS — E876 Hypokalemia: Secondary | ICD-10-CM | POA: Diagnosis not present

## 2021-10-31 DIAGNOSIS — Z809 Family history of malignant neoplasm, unspecified: Secondary | ICD-10-CM | POA: Diagnosis not present

## 2021-10-31 DIAGNOSIS — I10 Essential (primary) hypertension: Secondary | ICD-10-CM | POA: Diagnosis not present

## 2021-10-31 DIAGNOSIS — Z79899 Other long term (current) drug therapy: Secondary | ICD-10-CM | POA: Diagnosis not present

## 2021-10-31 DIAGNOSIS — I6789 Other cerebrovascular disease: Secondary | ICD-10-CM | POA: Diagnosis not present

## 2021-10-31 DIAGNOSIS — C3431 Malignant neoplasm of lower lobe, right bronchus or lung: Secondary | ICD-10-CM | POA: Diagnosis not present

## 2021-10-31 DIAGNOSIS — Z5111 Encounter for antineoplastic chemotherapy: Secondary | ICD-10-CM | POA: Diagnosis not present

## 2021-10-31 DIAGNOSIS — Z814 Family history of other substance abuse and dependence: Secondary | ICD-10-CM | POA: Diagnosis not present

## 2021-10-31 MED ORDER — SCOPOLAMINE 1 MG/3DAYS TD PT72
1.0000 | MEDICATED_PATCH | TRANSDERMAL | 3 refills | Status: DC
  Filled 2021-10-31: qty 10, 30d supply, fill #0

## 2021-10-31 NOTE — Telephone Encounter (Signed)
Spoke with pt's husband [name] regarding a prescription for a patch Dr.Vaslow discussed with them about. Dr. Mickeal Skinner states he is going to call in a order for the pt. [name] was made aware. No further questions or concerns from pt or husband.

## 2021-10-31 NOTE — Progress Notes (Signed)
[facility] at [facility] [place], [place] [address] [phone]   Interval Evaluation  Date of Service: 10/31/21 Patient Name: Virginia [name] Patient MRN: [id number] Patient DOB: 10-09-1955 Provider: Ventura Sellers, MD  Identifying Statement:  Virginia [name] is a 66 y.o. 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 Isidore Moos 12/02/20: Craniotomy, resection of progressive cerebellar mass by Dr. Vertell Limber.  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  She completed 3rd cycle of most recent course of avastin on 2/10.  Vertigo has improved, but episodes of less specific dizziness persist.  Hasn't been working with PT lately.  Continues on Pem/pem with Dr. DLorenso Courier  No seizures or headaches.    H+P (10/15/19) Patient presents to clinic to discuss recent neurologic symptoms and imaging findings.  She describes a fall while climbing stairs on 1/20; she hit the back of her and lost consciousness.  She is unclear whether she lost her balance or had mechanical provocation, but she had been drinking that evening.  She was treated initially as a trauma, CT demonstrated a lesion in the posterior fossa, later confirmed as a metastasis by MRI, along with 7 additional smaller lesions.  It took her almost two weeks to return for cognition and balance to return to "normal" which she feels she is about at right now.  Baseline she is high functioning and independent, works as a bNeurosurgeon  Has been taking decadron 241mtwice per day.  Plans to initiate chemotherapy soon with Dr. DoLorenso Courier Medications: Current Outpatient Medications on File Prior to  Visit  Medication Sig Dispense Refill   acetaminophen (TYLENOL) 500 MG tablet Take 1,000 mg by mouth every 8 (eight) hours as needed for moderate pain.     albuterol (VENTOLIN HFA) 108 (90 Base) MCG/ACT inhaler INHALE 2 PUFFS INTO THE LUNGS EVERY 6 HOURS AS NEEDED FOR WHEEZING OR SHORTNESS OF BREATH (Patient taking differently: Inhale 2 puffs into the lungs every 6 (six) hours as needed for shortness of breath or wheezing.) 6.7 g 11   ALPRAZolam (XANAX) 0.25 MG tablet Take 1 tablet (0.25 mg total) by mouth 2 (two) times daily as needed for anxiety 60 tablet 1   amLODipine (NORVASC) 10 MG tablet TAKE 1 TABLET BY MOUTH EVERY DAY 90 tablet 1   Cyanocobalamin (VITAMIN B12 PO) Take 2 tablets by mouth daily. Gummies     cyclobenzaprine (FLEXERIL) 5 MG tablet Take 5 mg by mouth at bedtime.     dexamethasone (DECADRON) 1 MG tablet Take 2 tablets by mouth daily. 60 tablet 1   folic acid (FOLVITE) 1 MG tablet Take 1 tablet by mouth daily. 90 tablet 1   furosemide (LASIX) 20 MG tablet Take 1 tablet (20 mg total) by mouth 2 (two) times daily. 180 tablet 1   lidocaine-prilocaine (EMLA) cream Apply to port as needed. 30 g 0   meclizine (ANTIVERT) 25 MG tablet Take 1 tablet (25 mg total) by mouth 3 (three) times daily as needed for dizziness. 30 tablet 0   Multiple Vitamins-Minerals (AIRBORNE PO) Take 1 tablet by mouth daily.      ofloxacin (OCUFLOX) 0.3 % ophthalmic solution 2 drops each eye  4 times a day for 5 days (Patient taking differently: Place 2 drops into both eyes daily as needed (irritation).) 5 mL 1   OLANZapine (ZYPREXA) 2.5 MG tablet TAKE 1 TABLET BY MOUTH EVERY NIGHT AT BEDTIME 90 tablet 1   ondansetron (ZOFRAN) 4 MG tablet TAKE 1 TO 2 TABLETS BY MOUTH 2 TIMES DAILY AS NEEDED 60 tablet 0   oxyCODONE (OXY IR/ROXICODONE) 5 MG immediate release tablet TAKE 1 TO 2 TABLETS BY MOUTH EVERY 4 HOURS AS NEEDED FOR SEVERE PAIN 90 tablet 0   pantoprazole (PROTONIX) 40 MG tablet TAKE 1 TABLET BY MOUTH ONCE  DAILY 90 tablet 3   pentoxifylline (TRENTAL) 400 MG CR tablet TAKE 1 TABLET BY MOUTH WITH FOOD FOR 1 WEEK THEN TAKE 1 TABLET BY MOUTH 2 TIMES DAILY THEREAFTER 60 tablet 2   polyethylene glycol (MIRALAX / GLYCOLAX) 17 g packet Take 17 g by mouth 2 (two) times daily.      potassium chloride SA (KLOR-CON M) 20 MEQ tablet Take 1 tablet by mouth 2  times daily. 60 tablet 2   prochlorperazine (COMPAZINE) 10 MG tablet Take 10 mg by mouth every 6 (six) hours as needed for nausea or vomiting.     promethazine (PHENERGAN) 12.5 MG tablet Take 1 to 2 tablets by mouth every 6 hours as needed for nausea 20 tablet 0   Vitamin D3 (VITAMIN D) 25 MCG tablet Take 2,000 Units by mouth daily.     vitamin E 180 MG (400 UNITS) capsule Take 400 IU daily x 1 week, then 400 IU BID (Patient taking differently: Take 400 Units by mouth in the morning and at bedtime. Take 400 IU daily x 1 week, then 400 IU BID) 60 capsule 4   XTAMPZA ER 13.5 MG C12A Take 1 capsule by mouth in the morning and at bedtime. (every 12 hours) 60 capsule 0   No current facility-administered medications on file prior to visit.    Allergies: No Known Allergies Past Medical History:  Past Medical History:  Diagnosis Date   Anemia    Anxiety    Concussion 09/28/2019   DVT (deep venous thrombosis) ([facility]) 2021   L leg   Dyspnea    GERD (gastroesophageal reflux disease)    Hypercholesterolemia    per pt, she does not have elevated lipids   Hypertension    met lung ca dx'd 09/2019   mets to spine, hip and brain   PONV (postoperative nausea and vomiting)    Tobacco abuse    Past Surgical History:  Past Surgical History:  Procedure Laterality Date   ABDOMINAL HYSTERECTOMY     partial/ left ovaries   APPLICATION OF CRANIAL NAVIGATION N/A 12/02/2020   Procedure: APPLICATION OF CRANIAL NAVIGATION;  Surgeon: Erline Levine, MD;  Location: [facility];  Service: Neurosurgery;  Laterality: N/A;   CHOLECYSTECTOMY     CRANIOTOMY N/A 12/02/2020   Procedure:  Posterior fossa craniotomy for tumor resection with brainlab;  Surgeon: Erline Levine, MD;  Location: [facility];  Service: Neurosurgery;  Laterality: N/A;   DILATION AND CURETTAGE OF UTERUS     IR IMAGING GUIDED PORT INSERTION  10/23/2019   KYPHOPLASTY N/A 03/15/2020   Procedure: Thoracic Eight KYPHOPLASTY;  Surgeon: Erline Levine, MD;  Location: [facility];  Service: Neurosurgery;  Laterality: N/A;  prone    LIPOMA EXCISION  2018   removed under left breast and right thigh.   TUBAL LIGATION     Social History:  Social History   Socioeconomic  History   Marital status: Married    Spouse name: Not on file   Number of children: 3   Years of education: Not on file   Highest education level: Not on file  Occupational History   Occupation: owns Academic librarian co    Employer: [employer]  Tobacco Use   Smoking status: Former    Packs/day: 0.25    Types: Cigarettes    Quit date: 10/31/2012    Years since quitting: 9.0   Smokeless tobacco: Never  Vaping Use   Vaping Use: Never used  Substance and Sexual Activity   Alcohol use: Yes    Alcohol/week: 0.0 standard drinks    Comment: rare   Drug use: No   Sexual activity: Yes  Other Topics Concern   Not on file  Social History Narrative   Not on file   Social Determinants of Health   Financial Resource Strain: Not on file  Food Insecurity: Not on file  Transportation Needs: Not on file  Physical Activity: Not on file  Stress: Not on file  Social Connections: Not on file  Intimate Partner Violence: Not on file   Family History:  Family History  Problem Relation Age of Onset   Heart disease Father    Drug abuse Daughter    Drug abuse Son    Cancer Sister    Heart disease Brother    Heart attack Brother     Review of Systems: Constitutional: Doesn't report fevers, chills or abnormal weight loss Eyes: Doesn't report blurriness of vision Ears, nose, mouth, throat, and face: Doesn't report sore throat Respiratory: Doesn't report  cough, dyspnea or wheezes Cardiovascular: Doesn't report palpitation, chest discomfort  Gastrointestinal:  Doesn't report nausea, constipation, diarrhea GU: Doesn't report incontinence Skin: Doesn't report skin rashes Neurological: Per HPI Musculoskeletal: Doesn't report joint pain Behavioral/Psych: Doesn't report anxiety  Physical Exam: Wt Readings from Last 3 Encounters:  10/31/21 163 lb 1.6 oz (74 kg)  10/20/21 169 lb 8 oz (76.9 kg)  09/28/21 172 lb 8 oz (78.2 kg)   Temp Readings from Last 3 Encounters:  10/31/21 97.8 F (36.6 C) (Axillary)  10/20/21 (!) 97.5 F (36.4 C) (Temporal)  09/08/21 98.1 F (36.7 C) (Oral)   BP Readings from Last 3 Encounters:  10/31/21 (!) 132/100  10/20/21 (!) 136/98  09/28/21 (!) 141/102   Pulse Readings from Last 3 Encounters:  10/31/21 (!) 107  10/20/21 93  09/28/21 96    KPS: 90. General: Alert, cooperative, pleasant, in no acute distress Head: Normal EENT: No conjunctival injection or scleral icterus.  Lungs: Resp effort normal Cardiac: Regular rate Abdomen: Non-distended abdomen Skin: No rashes cyanosis or petechiae. Extremities: No clubbing or edema  Neurologic Exam: Mental Status: Awake, alert, attentive to examiner. Oriented to self and environment. Language is fluent with intact comprehension.  Cranial Nerves: Visual acuity is grossly normal. Visual fields are full. Extra-ocular movements intact. No ptosis. Face is symmetric Motor: Tone and bulk are normal. Power is full in both arms and legs. Reflexes are symmetric, no pathologic reflexes present.  Sensory: Intact to light touch Gait: Moderate impairment to tandem gait only  Labs: I have reviewed the data as listed    Component Value Date/Time   NA 140 10/20/2021 1115   K 3.3 (L) 10/20/2021 1115   CL 102 10/20/2021 1115   CO2 29 10/20/2021 1115   GLUCOSE 150 (H) 10/20/2021 1115   BUN 9 10/20/2021 1115   CREATININE 0.69 10/20/2021 1115   CREATININE  0.58 11/15/2017  0948   CALCIUM 8.6 (L) 10/20/2021 1115   PROT 6.8 10/20/2021 1115   ALBUMIN 3.7 10/20/2021 1115   AST 18 10/20/2021 1115   ALT 23 10/20/2021 1115   ALKPHOS 69 10/20/2021 1115   BILITOT 0.5 10/20/2021 1115   GFRNONAA >60 10/20/2021 1115   GFRNONAA 100 11/15/2017 0948   GFRAA >60 06/09/2020 1051   GFRAA 116 11/15/2017 0948   Lab Results  Component Value Date   WBC 7.8 10/20/2021   NEUTROABS 5.4 10/20/2021   HGB 12.8 10/20/2021   HCT 36.0 10/20/2021   MCV 97.6 10/20/2021   PLT 306 10/20/2021    Imaging:  [facility] Clinician Interpretation: I have personally reviewed the CNS images as listed.  My interpretation, in the context of the patient's clinical presentation, is stable disease  MR BRAIN W WO CONTRAST  Result Date: 10/27/2021 CLINICAL DATA:  Brain/CNS neoplasm. Assess treatment response. Radiation and chemotherapy. Metastatic lung cancer. EXAM: MRI HEAD WITHOUT AND WITH CONTRAST TECHNIQUE: Multiplanar, multiecho pulse sequences of the brain and surrounding structures were obtained without and with intravenous contrast. CONTRAST:  44m MULTIHANCE GADOBENATE DIMEGLUMINE 529 MG/ML IV SOLN COMPARISON:  08/24/2021.  05/25/2021. FINDINGS: Brain: Marked favorable changes since the study of December 15. Numerous foci of abnormal enhancement throughout the cerebellum show marked reduction in contrast enhancement. The single exception to this is a rounded focus in the posterior right cerebellum on axial image 31 which remains visible as a round focus, but is perhaps a mm smaller, measuring 3 mm today. Restricted diffusion seen within the left middle cerebellar peduncle appears similar. No worsening or progressive finding in the posterior fossa. In the supratentorial brain, there are similarly favorable findings without any new or worsening findings. Foci of enhancement previously seen in the right occipital lobe axial image 57, the right parietal lobe axial image 90 for and the left parietal lobe  axial image 117 oral diminishing and less conspicuous. No new or progressive supra tentorial lesion. No hydrocephalus.  No extra-axial collection. Vascular: Major vessels at the base of the brain show flow. Skull and upper cervical spine: Otherwise negative Sinuses/Orbits: Clear/normal Other: None IMPRESSION: Favorable interval changes. Multiple foci of abnormal enhancement seen throughout the cerebellum are becoming less conspicuous with marked reduction in enhancement. The single exception to this is a 3 mm rounded focus in the posterior cerebellum on the right axial image 30/31, which does remain visible, though it may be a mm smaller. Scattered enhancing foci within the cerebral hemispheres are similarly diminishing. These are marked with arrows on today's study and correlated with the foci seen 08/24/2021. No supratentorial new or progressive lesion. Electronically Signed   By: MNelson ChimesM.D.   On: 10/27/2021 23:14     Assessment/Plan Brain metastases ([facility]  Radiation therapy induced brain necrosis   Ms. Goeden is actually clinically stable today.  MRI brain demonstrates significant improvement in burden of enhancement and T2 signal abnormality, consistent with treated radiation necrosis.  She may discontinue decadron, if tolerated.  We appreciate the opportunity to participate in the care of PKATHLINE BANBURY   We ask that PSHAVETTE SHOAFFreturn to clinic in 3 months following next brain MRI, or sooner as needed given changes noted today.  All questions were answered. The patient knows to call the clinic with any problems, questions or concerns. No barriers to learning were detected.  The total time spent in the encounter was 30 minutes and more than 50% was on counseling and  review of test results   Ventura Sellers, MD Medical Director of Neuro-Oncology [facility] at [facility] 10/31/21 11:22 AM

## 2021-11-01 ENCOUNTER — Telehealth: Payer: Self-pay | Admitting: Internal Medicine

## 2021-11-01 NOTE — Telephone Encounter (Signed)
Scheduled per 2/21 los, message has been left with pt

## 2021-11-06 ENCOUNTER — Telehealth: Payer: Self-pay | Admitting: *Deleted

## 2021-11-06 NOTE — Telephone Encounter (Signed)
Patient's spouse called.  Patient wants to clarify if it is ok to take the Oxycodone + Zyprexa + scopolamine.   Elleni just wanted to double check that the scopolamine was ok with the other medications since it was a new drug.  Routing to Provider.

## 2021-11-07 ENCOUNTER — Encounter (HOSPITAL_BASED_OUTPATIENT_CLINIC_OR_DEPARTMENT_OTHER): Payer: BC Managed Care – PPO | Admitting: Internal Medicine

## 2021-11-09 ENCOUNTER — Other Ambulatory Visit: Payer: Self-pay

## 2021-11-09 ENCOUNTER — Other Ambulatory Visit: Payer: Self-pay | Admitting: Hematology and Oncology

## 2021-11-09 ENCOUNTER — Inpatient Hospital Stay (HOSPITAL_BASED_OUTPATIENT_CLINIC_OR_DEPARTMENT_OTHER): Payer: BC Managed Care – PPO | Admitting: Hematology and Oncology

## 2021-11-09 ENCOUNTER — Inpatient Hospital Stay: Payer: BC Managed Care – PPO

## 2021-11-09 ENCOUNTER — Telehealth: Payer: Self-pay | Admitting: *Deleted

## 2021-11-09 ENCOUNTER — Other Ambulatory Visit (HOSPITAL_COMMUNITY): Payer: Self-pay

## 2021-11-09 ENCOUNTER — Inpatient Hospital Stay: Payer: BC Managed Care – PPO | Attending: Hematology and Oncology

## 2021-11-09 VITALS — BP 123/82 | HR 110 | Temp 97.7°F | Resp 18 | Wt 173.4 lb

## 2021-11-09 VITALS — HR 91

## 2021-11-09 DIAGNOSIS — R918 Other nonspecific abnormal finding of lung field: Secondary | ICD-10-CM | POA: Diagnosis not present

## 2021-11-09 DIAGNOSIS — C7931 Secondary malignant neoplasm of brain: Secondary | ICD-10-CM | POA: Diagnosis not present

## 2021-11-09 DIAGNOSIS — Z7901 Long term (current) use of anticoagulants: Secondary | ICD-10-CM | POA: Diagnosis not present

## 2021-11-09 DIAGNOSIS — E876 Hypokalemia: Secondary | ICD-10-CM | POA: Diagnosis not present

## 2021-11-09 DIAGNOSIS — Z5111 Encounter for antineoplastic chemotherapy: Secondary | ICD-10-CM | POA: Diagnosis not present

## 2021-11-09 DIAGNOSIS — R062 Wheezing: Secondary | ICD-10-CM | POA: Insufficient documentation

## 2021-11-09 DIAGNOSIS — M549 Dorsalgia, unspecified: Secondary | ICD-10-CM | POA: Diagnosis not present

## 2021-11-09 DIAGNOSIS — R140 Abdominal distension (gaseous): Secondary | ICD-10-CM | POA: Diagnosis not present

## 2021-11-09 DIAGNOSIS — Z5112 Encounter for antineoplastic immunotherapy: Secondary | ICD-10-CM | POA: Insufficient documentation

## 2021-11-09 DIAGNOSIS — Z9049 Acquired absence of other specified parts of digestive tract: Secondary | ICD-10-CM | POA: Diagnosis not present

## 2021-11-09 DIAGNOSIS — Z923 Personal history of irradiation: Secondary | ICD-10-CM | POA: Insufficient documentation

## 2021-11-09 DIAGNOSIS — R110 Nausea: Secondary | ICD-10-CM | POA: Insufficient documentation

## 2021-11-09 DIAGNOSIS — Z95828 Presence of other vascular implants and grafts: Secondary | ICD-10-CM

## 2021-11-09 DIAGNOSIS — R590 Localized enlarged lymph nodes: Secondary | ICD-10-CM | POA: Insufficient documentation

## 2021-11-09 DIAGNOSIS — Z86718 Personal history of other venous thrombosis and embolism: Secondary | ICD-10-CM | POA: Diagnosis not present

## 2021-11-09 DIAGNOSIS — E032 Hypothyroidism due to medicaments and other exogenous substances: Secondary | ICD-10-CM

## 2021-11-09 DIAGNOSIS — Z79899 Other long term (current) drug therapy: Secondary | ICD-10-CM | POA: Insufficient documentation

## 2021-11-09 DIAGNOSIS — C3431 Malignant neoplasm of lower lobe, right bronchus or lung: Secondary | ICD-10-CM | POA: Diagnosis not present

## 2021-11-09 DIAGNOSIS — R42 Dizziness and giddiness: Secondary | ICD-10-CM | POA: Diagnosis not present

## 2021-11-09 LAB — CBC WITH DIFFERENTIAL (CANCER CENTER ONLY)
Abs Immature Granulocytes: 0.05 10*3/uL (ref 0.00–0.07)
Basophils Absolute: 0 10*3/uL (ref 0.0–0.1)
Basophils Relative: 1 %
Eosinophils Absolute: 0.1 10*3/uL (ref 0.0–0.5)
Eosinophils Relative: 2 %
HCT: 32.8 % — ABNORMAL LOW (ref 36.0–46.0)
Hemoglobin: 11.5 g/dL — ABNORMAL LOW (ref 12.0–15.0)
Immature Granulocytes: 1 %
Lymphocytes Relative: 23 %
Lymphs Abs: 1.3 10*3/uL (ref 0.7–4.0)
MCH: 35.2 pg — ABNORMAL HIGH (ref 26.0–34.0)
MCHC: 35.1 g/dL (ref 30.0–36.0)
MCV: 100.3 fL — ABNORMAL HIGH (ref 80.0–100.0)
Monocytes Absolute: 1.2 10*3/uL — ABNORMAL HIGH (ref 0.1–1.0)
Monocytes Relative: 20 %
Neutro Abs: 3.1 10*3/uL (ref 1.7–7.7)
Neutrophils Relative %: 53 %
Platelet Count: 264 10*3/uL (ref 150–400)
RBC: 3.27 MIL/uL — ABNORMAL LOW (ref 3.87–5.11)
RDW: 14.2 % (ref 11.5–15.5)
WBC Count: 5.8 10*3/uL (ref 4.0–10.5)
nRBC: 0 % (ref 0.0–0.2)

## 2021-11-09 LAB — CMP (CANCER CENTER ONLY)
ALT: 18 U/L (ref 0–44)
AST: 20 U/L (ref 15–41)
Albumin: 3.3 g/dL — ABNORMAL LOW (ref 3.5–5.0)
Alkaline Phosphatase: 68 U/L (ref 38–126)
Anion gap: 5 (ref 5–15)
BUN: 5 mg/dL — ABNORMAL LOW (ref 8–23)
CO2: 29 mmol/L (ref 22–32)
Calcium: 8.1 mg/dL — ABNORMAL LOW (ref 8.9–10.3)
Chloride: 104 mmol/L (ref 98–111)
Creatinine: 0.61 mg/dL (ref 0.44–1.00)
GFR, Estimated: >60 mL/min (ref >60)
Glucose, Bld: 124 mg/dL — ABNORMAL HIGH (ref 70–99)
Potassium: 3.1 mmol/L — ABNORMAL LOW (ref 3.5–5.1)
Sodium: 138 mmol/L (ref 135–145)
Total Bilirubin: 0.6 mg/dL (ref 0.3–1.2)
Total Protein: 6.5 g/dL (ref 6.5–8.1)

## 2021-11-09 LAB — TSH: TSH: 1.965 u[IU]/mL (ref 0.308–3.960)

## 2021-11-09 LAB — LACTATE DEHYDROGENASE: LDH: 219 U/L — ABNORMAL HIGH (ref 98–192)

## 2021-11-09 MED ORDER — PROCHLORPERAZINE MALEATE 10 MG PO TABS
10.0000 mg | ORAL_TABLET | Freq: Once | ORAL | Status: AC
  Administered 2021-11-09: 10 mg via ORAL
  Filled 2021-11-09: qty 1

## 2021-11-09 MED ORDER — HEPARIN SOD (PORK) LOCK FLUSH 100 UNIT/ML IV SOLN
500.0000 [IU] | Freq: Once | INTRAVENOUS | Status: AC | PRN
  Administered 2021-11-09: 500 [IU]

## 2021-11-09 MED ORDER — SODIUM CHLORIDE 0.9 % IV SOLN
200.0000 mg | Freq: Once | INTRAVENOUS | Status: AC
  Administered 2021-11-09: 200 mg via INTRAVENOUS
  Filled 2021-11-09: qty 200

## 2021-11-09 MED ORDER — SODIUM CHLORIDE 0.9% FLUSH
10.0000 mL | INTRAVENOUS | Status: AC | PRN
  Administered 2021-11-09: 10 mL

## 2021-11-09 MED ORDER — SODIUM CHLORIDE 0.9 % IV SOLN
500.0000 mg/m2 | Freq: Once | INTRAVENOUS | Status: AC
  Administered 2021-11-09: 900 mg via INTRAVENOUS
  Filled 2021-11-09: qty 20

## 2021-11-09 MED ORDER — CYANOCOBALAMIN 1000 MCG/ML IJ SOLN
1000.0000 ug | Freq: Once | INTRAMUSCULAR | Status: AC
  Administered 2021-11-09: 1000 ug via INTRAMUSCULAR
  Filled 2021-11-09: qty 1

## 2021-11-09 MED ORDER — FUROSEMIDE 20 MG PO TABS
40.0000 mg | ORAL_TABLET | Freq: Two times a day (BID) | ORAL | 0 refills | Status: DC
  Filled 2021-11-09: qty 360, 90d supply, fill #0

## 2021-11-09 MED ORDER — POTASSIUM CHLORIDE CRYS ER 20 MEQ PO TBCR
EXTENDED_RELEASE_TABLET | ORAL | 2 refills | Status: DC
  Filled 2021-11-09: qty 90, 30d supply, fill #0
  Filled 2021-12-31: qty 90, 30d supply, fill #1
  Filled 2022-01-30: qty 90, 30d supply, fill #2

## 2021-11-09 MED ORDER — SODIUM CHLORIDE 0.9% FLUSH
10.0000 mL | INTRAVENOUS | Status: DC | PRN
  Administered 2021-11-09: 10 mL

## 2021-11-09 MED ORDER — SODIUM CHLORIDE 0.9 % IV SOLN: Freq: Once | INTRAVENOUS | Status: AC

## 2021-11-09 NOTE — Telephone Encounter (Signed)
Patient states they haven't heard from PT referral.  Referral was placed to outpatient neur rehab.  Called to Outpatient Neuro and advised of referral and they will reach out to patient to schedule. ?

## 2021-11-09 NOTE — Patient Instructions (Signed)
[facility]  Discharge Instructions: ?Thank you for choosing [facility] to provide your oncology and hematology care.  ? ?If you have a lab appointment with the [facility], please go directly to the [facility] and check in at the registration area. ?  ?Wear comfortable clothing and clothing appropriate for easy access to any Portacath or PICC line.  ? ?We strive to give you quality time with your provider. You may need to reschedule your appointment if you arrive late (15 or more minutes).  Arriving late affects you and other patients whose appointments are after yours.  Also, if you miss three or more appointments without notifying the office, you may be dismissed from the clinic at the provider?s discretion.    ?  ?For prescription refill requests, have your pharmacy contact our office and allow 72 hours for refills to be completed.   ? ?Today you received the following chemotherapy and/or immunotherapy agents pemetrexed, pembrolizumab, bevacizumab    ?  ?To help prevent nausea and vomiting after your treatment, we encourage you to take your nausea medication as directed. ? ?BELOW ARE SYMPTOMS THAT SHOULD BE REPORTED IMMEDIATELY: ?*FEVER GREATER THAN 100.4 F (38 ?C) OR HIGHER ?*CHILLS OR SWEATING ?*NAUSEA AND VOMITING THAT IS NOT CONTROLLED WITH YOUR NAUSEA MEDICATION ?*UNUSUAL SHORTNESS OF BREATH ?*UNUSUAL BRUISING OR BLEEDING ?*URINARY PROBLEMS (pain or burning when urinating, or frequent urination) ?*BOWEL PROBLEMS (unusual diarrhea, constipation, pain near the anus) ?TENDERNESS IN MOUTH AND THROAT WITH OR WITHOUT PRESENCE OF ULCERS (sore throat, sores in mouth, or a toothache) ?UNUSUAL RASH, SWELLING OR PAIN  ?UNUSUAL VAGINAL DISCHARGE OR ITCHING  ? ?Items with * indicate a potential emergency and should be followed up as soon as possible or go to the Emergency Department if any problems should occur. ? ?Please show the CHEMOTHERAPY ALERT CARD or  IMMUNOTHERAPY ALERT CARD at check-in to the Emergency Department and triage nurse. ? ?Should you have questions after your visit or need to cancel or reschedule your appointment, please contact [facility]  Dept: [phone]  and follow the prompts.  Office hours are 8:00 a.m. to 4:30 p.m. Monday - Friday. Please note that voicemails left after 4:00 p.m. may not be returned until the following business day.  We are closed weekends and major holidays. You have access to a nurse at all times for urgent questions. Please call the main number to the clinic Dept: [phone] and follow the prompts. ? ? ?For any non-urgent questions, you may also contact your provider using MyChart. We now offer e-Visits for anyone 41 and older to request care online for non-urgent symptoms. For details visit [email or web address]. ?  ?Also download the MyChart app! Go to the app store, search "MyChart", open the app, select [facility], and log in with your MyChart username and password. ? ?Due to Covid, a mask is required upon entering the hospital/clinic. If you do not have a mask, one will be given to you upon arrival. For doctor visits, patients may have 1 support person aged 39 or older with them. For treatment visits, patients cannot have anyone with them due to current Covid guidelines and our immunocompromised population.  ? ?

## 2021-11-09 NOTE — Progress Notes (Signed)
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 CT of the head showed showed concerning for metastatic disease involving the left cerebellum and right occipital lobe.  2) 09/30/2019: MRI brain confirms mass within the left cerebellum measures 1.6 x 1.3 x 1.5 cm as well as at least 8 metastatic lesions within the supratentorial and infratentorial brain as outlined 3) 10/01/2019: PET CT scan revealed hypermetabolic infrahilar right lower lobe mass with hypermetabolic nodal metastases in the right hilum, mediastinum, right supraclavicular region, left axilla and lower left neck. 4) 10/08/2019: US guided biopsy of the lymph nodes confirms poorly differentiated non-small cell carcinoma. Immunohistochemistry confirms adenocarcinoma of lung primary. PD-L1 and NGS later revealed a MET Exon 14 mutation and TPS of 90%.  5) 10/12/2019: Establish care with Dr. Lorenso Courier  6) 11/04/2019: Day 1 of capmatinib 454m BID 7) 11/09/2019: presented to Rad/Onc with a DVT in LLE. Seen in symptom management clinic and started on apixaban.  8) 12/29/2019: CT C/A/P showed interval decrease in size of mass involving the superior segment of right lower lobe and reduction in mediastinal and left supraclavicular adenopathy though some small spinal lesions were noted in the interim between the two sets of scans 9) 01/25/2020: temporarily stopped capmatinib due to symptoms of nausea/fatigue. Restarted therapy on 01/30/2020 after brief chemo holiday.  10) 03/22/2020: CT C/A/P reveals a mixed picture, with new lung nodule and  increased lymphadenopathy, however response in bone lesions. D/c capmatinib therapy 11) 04/08/2020: start of Carbo/Pem/Pem for 2nd line treatment. Cycle 1 Day 1   12) 04/29/2020: Cycle 2 Day 1 Carbo/Pem/Pem  13) 05/20/2020: Cycle 3 Day 1 Carbo/Pem/Pem  14) 06/09/2020: Cycle 4 Day 1 Carbo/Pem/Pem  15) 06/29/2020: Cycle 5 Day 1 maintenance Pem/Pem  16) 07/20/2020: Cycle 6 Day 1 maintenance Pem/Pem  17) 08/12/2020: Cycle 7 Day 1 maintenance Pem/Pem  18) 09/01/2020: Cycle 8 Day 1 maintenance Pem/Pem  19) 09/21/2020: Cycle 9 Day 1 maintenance Pem/Pem  20) 10/13/2020: Cycle 10 Day 1 maintenance Pem/Pem  21) 11/03/2020: Cycle 11 Day 1 maintenance Pem/Pem  22) 11/25/2020:  Cycle 12 Day 1 maintenance Pem/Pem 23) 12/15/2020:  Cycle 13 Day 1 maintenance Pem/Pem 24) 01/05/2021:  Cycle 14 Day 1 maintenance Pem/Pem 25) 01/26/2021:  Cycle 15 Day 1 maintenance Pem/Pem plus Bevacizumab.  02/17/2021: Cycle 16 Day 1 maintenance Pem/Pem plus Bevacizumab.  03/10/2021: Cycle 17 Day 1 maintenance Pem/Pem plus Bevacizumab.  03/31/2021: Cycle 18 Day 1 maintenance Pem/Pem  04/20/2021:  Cycle 19 Day 1 maintenance Pem/Pem  05/12/2021: Cycle 20 Day 1 maintenance Pem/Pem 06/01/2021: Cycle 21 Day 1 maintenance Pem/Pem 06/22/2021: Cycle 22 Day 1 maintenance Pem/Pem 07/13/2021: Cycle 23 Day 1 maintenance Pem/Pem 08/16/2021: Cycle 24 Day 1 maintenance Pem/Pem 09/08/2021: Cycle 25 Day 1 maintenance Pem/Pem plus Bevacizumab 09/28/2021: Cycle 26 Day 1 maintenance Pem/Pem plus Bevacizumab 10/20/2021: Cycle 27 Day 1 maintenance Pem/Pem plus Bevacizumab 11/09/2021: Cycle 28 Day 1 maintenance Pem/Pem plus Bevacizumab  Interval History:  PRACHE KLIMASZEWSKI89y.o. female with medical history significant for metastatic adenocarcinoma of the lung presents for a follow up visit. The patient's last visit was on 10/20/2021.  In the interim since the  last visit, she has continued pem/pem maintenance.  Today is Cycle 28 Day 1.   On exam today Mrs. Wisz is  accompanied by her husband.  She reports she has been dealing with a great deal of swelling.  Her weight is unfortunately up 10 pounds in the interim since our last visit 3 weeks ago.  She is having more swelling of her lower extremities and around her face.  She notes that the appetite has been decreasing and she has been struggling with some wheezing.  She is been using a scopolamine patch to help with her dizziness since Tuesday with no improvements.  She notes that she using Antivert as needed without much help.  She is also been feeling some bloating in her belly.  She notes that these symptoms all began around the time her steroid therapy was discontinued.  She notes that her back pain is currently about a 3 out of 10.  Her pain medication is working well and she is also using heating pad.  She is having some increased nausea as well.  She has had increased sleepiness and fatigue.  She denies any fevers, chills, night sweats, chest pain, cough, rash or neuropathy. She has no other complaints. Rest of the 10 point ROS is below.  She is willing and able to proceed with treatment at this time.  MEDICAL HISTORY:  Past Medical History:  Diagnosis Date   Anemia    Anxiety    Concussion 09/28/2019   DVT (deep venous thrombosis) ([place]) 2021   L leg   Dyspnea    GERD (gastroesophageal reflux disease)    Hypercholesterolemia    per pt, she does not have elevated lipids   Hypertension    met lung ca dx'd 09/2019   mets to spine, hip and brain   PONV (postoperative nausea and vomiting)    Tobacco abuse     SURGICAL HISTORY: Past Surgical History:  Procedure Laterality Date   ABDOMINAL HYSTERECTOMY     partial/ left ovaries   APPLICATION OF CRANIAL NAVIGATION N/A 12/02/2020   Procedure: APPLICATION OF CRANIAL NAVIGATION;  Surgeon: Erline Levine, MD;  Location: [place];  Service: Neurosurgery;  Laterality: N/A;   CHOLECYSTECTOMY     CRANIOTOMY N/A 12/02/2020   Procedure: Posterior fossa craniotomy  for tumor resection with brainlab;  Surgeon: Erline Levine, MD;  Location: [place];  Service: Neurosurgery;  Laterality: N/A;   DILATION AND CURETTAGE OF UTERUS     IR IMAGING GUIDED PORT INSERTION  10/23/2019   KYPHOPLASTY N/A 03/15/2020   Procedure: Thoracic Eight KYPHOPLASTY;  Surgeon: Erline Levine, MD;  Location: [place];  Service: Neurosurgery;  Laterality: N/A;  prone    LIPOMA EXCISION  2018   removed under left breast and right thigh.   TUBAL LIGATION      ALLERGIES:  has No Known Allergies.  MEDICATIONS:  Current Outpatient Medications  Medication Sig Dispense Refill   acetaminophen (TYLENOL) 500 MG tablet Take 1,000 mg by mouth every 8 (eight) hours as needed for moderate pain.     albuterol (VENTOLIN HFA) 108 (90 Base) MCG/ACT inhaler INHALE 2 PUFFS INTO THE LUNGS EVERY 6 HOURS AS NEEDED FOR WHEEZING OR SHORTNESS OF BREATH (Patient taking differently: Inhale 2 puffs into the lungs every 6 (six) hours as needed for shortness of breath or wheezing.) 6.7 g 11   ALPRAZolam (XANAX) 0.25 MG tablet Take 1 tablet (0.25 mg total) by mouth 2 (two) times daily as needed for anxiety 60 tablet  1   amLODipine (NORVASC) 10 MG tablet TAKE 1 TABLET BY MOUTH EVERY DAY 90 tablet 1   Cyanocobalamin (VITAMIN B12 PO) Take 2 tablets by mouth daily. Gummies     cyclobenzaprine (FLEXERIL) 5 MG tablet Take 5 mg by mouth at bedtime.     dexamethasone (DECADRON) 1 MG tablet Take 2 tablets by mouth daily. (Patient not taking: Reported on 11/09/2021) 60 tablet 1   folic acid (FOLVITE) 1 MG tablet Take 1 tablet by mouth daily. 90 tablet 1   furosemide (LASIX) 20 MG tablet Take 2 tablets (40 mg total) by mouth 2 times daily. 360 tablet 0   lidocaine-prilocaine (EMLA) cream Apply to port as needed. 30 g 0   meclizine (ANTIVERT) 25 MG tablet Take 1 tablet (25 mg total) by mouth 3 (three) times daily as needed for dizziness. 30 tablet 0   Multiple Vitamins-Minerals (AIRBORNE PO) Take 1 tablet by mouth daily.       ofloxacin (OCUFLOX) 0.3 % ophthalmic solution 2 drops each eye 4 times a day for 5 days (Patient taking differently: Place 2 drops into both eyes daily as needed (irritation).) 5 mL 1   OLANZapine (ZYPREXA) 2.5 MG tablet TAKE 1 TABLET BY MOUTH EVERY NIGHT AT BEDTIME 90 tablet 1   ondansetron (ZOFRAN) 4 MG tablet TAKE 1 TO 2 TABLETS BY MOUTH 2 TIMES DAILY AS NEEDED 60 tablet 0   oxyCODONE (OXY IR/ROXICODONE) 5 MG immediate release tablet TAKE 1 TO 2 TABLETS BY MOUTH EVERY 4 HOURS AS NEEDED FOR SEVERE PAIN 90 tablet 0   pantoprazole (PROTONIX) 40 MG tablet TAKE 1 TABLET BY MOUTH ONCE DAILY 90 tablet 3   pentoxifylline (TRENTAL) 400 MG CR tablet TAKE 1 TABLET BY MOUTH WITH FOOD FOR 1 WEEK THEN TAKE 1 TABLET BY MOUTH 2 TIMES DAILY THEREAFTER (Patient not taking: Reported on 11/09/2021) 60 tablet 2   polyethylene glycol (MIRALAX / GLYCOLAX) 17 g packet Take 17 g by mouth 2 (two) times daily.      potassium chloride SA (KLOR-CON M) 20 MEQ tablet Take 2 tablets in the morning and 1 tablet in the evening. 90 tablet 2   prochlorperazine (COMPAZINE) 10 MG tablet Take 10 mg by mouth every 6 (six) hours as needed for nausea or vomiting.     promethazine (PHENERGAN) 12.5 MG tablet Take 1 to 2 tablets by mouth every 6 hours as needed for nausea 20 tablet 0   scopolamine (TRANSDERM-SCOP) 1 MG/3DAYS Place 1 patch (1.5 mg total) onto the skin every 3 (three) days. 10 patch 3   Vitamin D3 (VITAMIN D) 25 MCG tablet Take 2,000 Units by mouth daily.     vitamin E 180 MG (400 UNITS) capsule Take 400 IU daily x 1 week, then 400 IU BID (Patient taking differently: Take 400 Units by mouth in the morning and at bedtime. Take 400 IU daily x 1 week, then 400 IU BID) 60 capsule 4   XTAMPZA ER 13.5 MG C12A Take 1 capsule by mouth in the morning and at bedtime. (every 12 hours) 60 capsule 0   No current facility-administered medications for this visit.   Facility-Administered Medications Ordered in Other Visits  Medication Dose  Route Frequency Provider Last Rate Last Admin   sodium chloride flush (NS) 0.9 % injection 10 mL  10 mL Intracatheter PRN Ledell Peoples IV, MD   10 mL at 11/09/21 1431    REVIEW OF SYSTEMS:   Constitutional: ( - ) fevers, ( - )  chills , ( - ) night sweats Eyes: ( - ) blurriness of vision, ( - ) double vision, ( - ) watery eyes Ears, nose, mouth, throat, and face: ( - ) mucositis, ( - ) sore throat Respiratory: ( - ) cough, ( - ) dyspnea, ( - ) wheezes Cardiovascular: ( - ) palpitation, ( - ) chest discomfort, ( - ) lower extremity swelling Gastrointestinal:  ( - ) nausea, ( - ) heartburn, ( - ) change in bowel habits Skin: ( - ) abnormal skin rashes Lymphatics: ( - ) new lymphadenopathy, ( - ) easy bruising Neurological: ( - ) numbness, ( - ) tingling, ( - ) new weaknesses Behavioral/Psych: ( - ) mood change, ( - ) new changes  All other systems were reviewed with the patient and are negative.  PHYSICAL EXAMINATION: ECOG PERFORMANCE STATUS: 1 - Symptomatic but completely ambulatory  Vitals:   11/09/21 1112  BP: 123/82  Pulse: (!) 110  Resp: 18  Temp: 97.7 F (36.5 C)  SpO2: 94%     Filed Weights   11/09/21 1112  Weight: 173 lb 6 oz (78.6 kg)      GENERAL: well appearing middle aged Caucasian female in NAD  SKIN: skin color, texture, turgor are normal, no rashes or significant lesions EYES: conjunctiva are pink and non-injected, sclera clear LUNGS: wheezing at lung bases. clear to auscultation and percussion with normal breathing effort HEART: regular rate & rhythm and no murmurs and bilateral trace pitting lower extremity edema Musculoskeletal: no cyanosis of digits and no clubbing PSYCH: alert & oriented x 3, fluent speech NEURO: no focal motor/sensory deficits    LABORATORY DATA:  I have reviewed the data as listed CBC Latest Ref Rng & Units 11/09/2021 10/20/2021 09/28/2021  WBC 4.0 - 10.5 K/uL 5.8 7.8 10.9(H)  Hemoglobin 12.0 - 15.0 g/dL 11.5(L) 12.8 13.6   Hematocrit 36.0 - 46.0 % 32.8(L) 36.0 37.4  Platelets 150 - 400 K/uL 264 306 335    CMP Latest Ref Rng & Units 11/09/2021 10/20/2021 09/28/2021  Glucose 70 - 99 mg/dL 124(H) 150(H) 130(H)  BUN 8 - 23 mg/dL 5(L) 9 16  Creatinine 0.44 - 1.00 mg/dL 0.61 0.69 0.92  Sodium 135 - 145 mmol/L 138 140 141  Potassium 3.5 - 5.1 mmol/L 3.1(L) 3.3(L) 3.3(L)  Chloride 98 - 111 mmol/L 104 102 102  CO2 22 - 32 mmol/L _0 Calcium 8.9 - 10.3 mg/dL 8.1(L) 8.6(L) 8.9  Total Protein 6.5 - 8.1 g/dL 6.5 6.8 7.1  Total Bilirubin 0.3 - 1.2 mg/dL 0.6 0.5 0.3  Alkaline Phos 38 - 126 U/L 68 69 70  AST 15 - 41 U/L 20 18 14(L)  ALT 0 - 44 U/L _1 RADIOGRAPHIC STUDIES: I personally have viewed the radiographic studies below: MR BRAIN W WO CONTRAST  Result Date: 10/27/2021 CLINICAL DATA:  Brain/CNS neoplasm. Assess treatment response. Radiation and chemotherapy. Metastatic lung cancer. EXAM: MRI HEAD WITHOUT AND WITH CONTRAST TECHNIQUE: Multiplanar, multiecho pulse sequences of the brain and surrounding structures were obtained without and with intravenous contrast. CONTRAST:  61m MULTIHANCE GADOBENATE DIMEGLUMINE 529 MG/ML IV SOLN COMPARISON:  08/24/2021.  05/25/2021. FINDINGS: Brain: Marked favorable changes since the study of December 15. Numerous foci of abnormal enhancement throughout the cerebellum show marked reduction in contrast enhancement. The single exception to this is a rounded focus in the posterior right cerebellum on axial image 31 which remains visible as a round focus, but is  perhaps a mm smaller, measuring 3 mm today. Restricted diffusion seen within the left middle cerebellar peduncle appears similar. No worsening or progressive finding in the posterior fossa. In the supratentorial brain, there are similarly favorable findings without any new or worsening findings. Foci of enhancement previously seen in the right occipital lobe axial image 57, the right parietal lobe axial image 90 for  and the left parietal lobe axial image 117 oral diminishing and less conspicuous. No new or progressive supra tentorial lesion. No hydrocephalus.  No extra-axial collection. Vascular: Major vessels at the base of the brain show flow. Skull and upper cervical spine: Otherwise negative Sinuses/Orbits: Clear/normal Other: None IMPRESSION: Favorable interval changes. Multiple foci of abnormal enhancement seen throughout the cerebellum are becoming less conspicuous with marked reduction in enhancement. The single exception to this is a 3 mm rounded focus in the posterior cerebellum on the right axial image 30/31, which does remain visible, though it may be a mm smaller. Scattered enhancing foci within the cerebral hemispheres are similarly diminishing. These are marked with arrows on today's study and correlated with the foci seen 08/24/2021. No supratentorial new or progressive lesion. Electronically Signed   By: Nelson Chimes M.D.   On: 10/27/2021 23:14     ASSESSMENT & PLAN Virginia [name] 66 y.o. female with medical history significant for metastatic adenocarcinoma of the lung presents for a follow up visit. 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   Previously we discussed Carboplatin/Pembrolizumab/Pemetrexed and the expected side effect from these medications.  We discussed the possible side effects of immunotherapy including colitis, hepatitis, dermatitis, and pneumonitis.  Additionally we discussed the side effects of carboplatin which include suppression of blood counts, nephrotoxicity, and nausea.  Additionally we discussed pemetrexed which can also have effects on counts and would require supplementation with vitamin Z66 and folic acid.  She voiced her understanding of these side effects and treatment plans moving forward.  I also noted that we would be able to drop the chemotherapy agents that she had intolerance to continue with pembrolizumab alone given her high  TPS score.  The patient has a markedly elevated TPS score (90%)   Mrs. Araque continues to tolerate the treatment. Her labs from today were reviewed without any intervention needed. Patient will proceed with treatment as planned and return to the clinic in 3 weeks prior to her next cycle.   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 Due to worsening dizziness, she will resumed this with Cycle 25.  --patient will be followed every 3 weeks with labs and clinic visit --repeat CT CAP on 09/18/2021 showed stable disease with no evidence of progression. Next due in April 2023.  --supportive therapy as listed below.  --RTC q 3 weeks for continued monitoring/chemotherapy treatments.    #Nausea/Vomiting, improving -- provided patient with Zofran 4-107m q8h PRN and compazine 180mq6H for breakthrough PRN.  She is currently using Phenergan for breakthrough. --continue olanzpaine 2.5 mg PO QHS to help with nausea. Marked improvement since the addition of this medication on 01/29/2020.  --continue to monitor   #Hypokalemia, stable --likely 2/2 to decreased PO intake, hypovolemia, and BP medications --Potassium level 3.1 today.   --recommend 40 mEq PO potassium in AM and 2073min PM  #Pain Control, stable --continue oxycodone  65m q4H PRN. Can increase dose to 172mq6H if pain is severe. She is taking approximately 2-3 pills per day.  --continue Xtampza ER BID for long acting support.  --continue to take with Senokot to prevent opioid induced constipation. 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 Last 10/20/2021, next due in May 2023.  --nausea as above    #Brain Metastasis, stable --appreciate the assistance of Dr. VaMickeal Skinnern treating  her brain metastasis and symptoms. --radiation therapy to the brain completed on 11/16/2019.  --MRI brain on 04/29/2020 showed evidence of small brain bleed. Eliquis therapy was discontinued.  --MRI on 11/04/2020 showed concern for progression of intracranial disease.  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  --MRI on 12/23/2020 showed evidence of radiation necrosis. Dr. VaMickeal Skinnervaluated the patient on 01/02/2021 with recommendations to add IV Bevacizumab 10 mg/kg q 3 weeks ( started 01/26/2021). Completed a full course of this therapy. --05/26/2021: MRI brain showed no substantial changes.  --Patient was evaluated by Dr. VaMickeal Skinnern 08/29/2021 and recommended to resume Bevacizumab due to worsening dizziness.  This was held today.  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    # Hypertension: --BP at today's visit was 141/102. Continue Norvasc to 10 mg daily.  --continue to monitor at home and in clinic.    Orders Placed This Encounter  Procedures   CT CHEST ABDOMEN PELVIS W CONTRAST    Standing Status:   Future    Standing Expiration Date:   11/10/2022    Order Specific Question:   [facility]    [facility]  Order Specific Question:   Is Oral Contrast requested for this exam?    Answer:   Yes, Per Radiology protocol    All questions were answered. The patient knows to call the clinic with any problems, questions or concerns.  I have spent a total of 30 minutes minutes of face-to-face and non-face-to-face time, preparing to see the patient, performing a medically appropriate examination, counseling and educating the patient,  documenting clinical information in the electronic health record, and care coordination.     JoOrson SlickMD Department of Hematology/Oncology [facility]Minn[facility] Research Institutehone: [phone] 11/09/2021 4:39 PM   Literature Support:  GaLorenza ChickRoClearence CheekGaDuffy RhodyFelip E, DeCuster, Domine M, ClKeshenaHochmair MJ, PoClearmontChEdgarBischoff HG, PeDelcambreGrossi F, JeJacksonvilleReck M, HuToledoGaCissna ParkBoMentoneRubio-Viqueira B, Novello S, Kurata T, Gray JE, Vida J, Wei Z, Yang J, Raftopoulos H, PiCoatsburgGaMelvilleCWeyerhaeuser [id number] Investigators. Pembrolizumab plus Chemotherapy in Metastatic Non-Small-Cell Lung Cancer. N Alta Corninged. 2018 May 31;378(22):2078-2092.  --Median progression-free survival was 8.8 months (95% CI, 7.6 to 9.2) in the pembrolizumab-combination group and 4.9 months (95% CI, 4.7 to 5.5) in the placebo-combination group (hazard ratio for disease progression or death, 0.52; 95% CI, 0.43 to 0.64; P<0.001). Adverse events of grade 3 or higher occurred in 67.2% of the patients in the pembrolizumab-combination group and in 65.8% of those in the placebo-combination group.  HaJodene Nam. Efficacy of pemetrexed-based regimens  in advanced non-small cell lung cancer patients with activating epidermal growth factor receptor mutations after tyrosine kinase inhibitor failure: a systematic review. Onco Targets Ther. 2018;11:2121-2129.   --The weighted median PFS, median OS, and ORR for patients treated with pem regimens were 5.09 months, 15.91 months, and 30.19%, respectively. Our systematic review results showed a favorable efficacy profile of pem regimens in NSCLC patients with EGFR mutation after EGFR-TKI failure.

## 2021-11-13 ENCOUNTER — Inpatient Hospital Stay (HOSPITAL_COMMUNITY)
Admission: EM | Admit: 2021-11-13 | Discharge: 2021-11-18 | DRG: 271 | Disposition: A | Discharge status: Home / Self Care | Payer: BC Managed Care – PPO | Attending: Internal Medicine | Admitting: Internal Medicine

## 2021-11-13 ENCOUNTER — Other Ambulatory Visit: Payer: Self-pay

## 2021-11-13 ENCOUNTER — Emergency Department (HOSPITAL_BASED_OUTPATIENT_CLINIC_OR_DEPARTMENT_OTHER): Payer: BC Managed Care – PPO

## 2021-11-13 ENCOUNTER — Emergency Department (HOSPITAL_COMMUNITY): Payer: BC Managed Care – PPO

## 2021-11-13 ENCOUNTER — Telehealth: Payer: Self-pay | Admitting: *Deleted

## 2021-11-13 ENCOUNTER — Encounter (HOSPITAL_COMMUNITY): Payer: Self-pay

## 2021-11-13 DIAGNOSIS — R110 Nausea: Secondary | ICD-10-CM | POA: Diagnosis present

## 2021-11-13 DIAGNOSIS — Z86718 Personal history of other venous thrombosis and embolism: Secondary | ICD-10-CM | POA: Diagnosis not present

## 2021-11-13 DIAGNOSIS — Z87891 Personal history of nicotine dependence: Secondary | ICD-10-CM | POA: Diagnosis not present

## 2021-11-13 DIAGNOSIS — G893 Neoplasm related pain (acute) (chronic): Secondary | ICD-10-CM | POA: Diagnosis not present

## 2021-11-13 DIAGNOSIS — C7931 Secondary malignant neoplasm of brain: Secondary | ICD-10-CM | POA: Diagnosis present

## 2021-11-13 DIAGNOSIS — Z20822 Contact with and (suspected) exposure to covid-19: Secondary | ICD-10-CM | POA: Diagnosis present

## 2021-11-13 DIAGNOSIS — R509 Fever, unspecified: Secondary | ICD-10-CM | POA: Diagnosis not present

## 2021-11-13 DIAGNOSIS — E7800 Pure hypercholesterolemia, unspecified: Secondary | ICD-10-CM | POA: Diagnosis present

## 2021-11-13 DIAGNOSIS — D849 Immunodeficiency, unspecified: Secondary | ICD-10-CM | POA: Diagnosis not present

## 2021-11-13 DIAGNOSIS — T80219A Unspecified infection due to central venous catheter, initial encounter: Principal | ICD-10-CM

## 2021-11-13 DIAGNOSIS — T80212A Local infection due to central venous catheter, initial encounter: Secondary | ICD-10-CM | POA: Diagnosis not present

## 2021-11-13 DIAGNOSIS — E876 Hypokalemia: Secondary | ICD-10-CM

## 2021-11-13 DIAGNOSIS — Z8249 Family history of ischemic heart disease and other diseases of the circulatory system: Secondary | ICD-10-CM

## 2021-11-13 DIAGNOSIS — Z79899 Other long term (current) drug therapy: Secondary | ICD-10-CM

## 2021-11-13 DIAGNOSIS — R059 Cough, unspecified: Secondary | ICD-10-CM | POA: Diagnosis not present

## 2021-11-13 DIAGNOSIS — L98498 Non-pressure chronic ulcer of skin of other sites with other specified severity: Secondary | ICD-10-CM | POA: Diagnosis not present

## 2021-11-13 DIAGNOSIS — E871 Hypo-osmolality and hyponatremia: Secondary | ICD-10-CM

## 2021-11-13 DIAGNOSIS — I10 Essential (primary) hypertension: Secondary | ICD-10-CM | POA: Diagnosis present

## 2021-11-13 DIAGNOSIS — M7989 Other specified soft tissue disorders: Secondary | ICD-10-CM | POA: Diagnosis not present

## 2021-11-13 DIAGNOSIS — L03115 Cellulitis of right lower limb: Secondary | ICD-10-CM

## 2021-11-13 DIAGNOSIS — E861 Hypovolemia: Secondary | ICD-10-CM | POA: Diagnosis present

## 2021-11-13 DIAGNOSIS — T451X5A Adverse effect of antineoplastic and immunosuppressive drugs, initial encounter: Secondary | ICD-10-CM | POA: Diagnosis present

## 2021-11-13 DIAGNOSIS — F419 Anxiety disorder, unspecified: Secondary | ICD-10-CM | POA: Diagnosis present

## 2021-11-13 DIAGNOSIS — C7800 Secondary malignant neoplasm of unspecified lung: Secondary | ICD-10-CM | POA: Diagnosis present

## 2021-11-13 DIAGNOSIS — K219 Gastro-esophageal reflux disease without esophagitis: Secondary | ICD-10-CM | POA: Diagnosis present

## 2021-11-13 DIAGNOSIS — I6789 Other cerebrovascular disease: Secondary | ICD-10-CM | POA: Diagnosis not present

## 2021-11-13 DIAGNOSIS — C3490 Malignant neoplasm of unspecified part of unspecified bronchus or lung: Secondary | ICD-10-CM | POA: Diagnosis not present

## 2021-11-13 DIAGNOSIS — I38 Endocarditis, valve unspecified: Secondary | ICD-10-CM | POA: Diagnosis not present

## 2021-11-13 DIAGNOSIS — C7951 Secondary malignant neoplasm of bone: Secondary | ICD-10-CM | POA: Diagnosis not present

## 2021-11-13 DIAGNOSIS — R410 Disorientation, unspecified: Secondary | ICD-10-CM | POA: Diagnosis not present

## 2021-11-13 DIAGNOSIS — Y828 Other medical devices associated with adverse incidents: Secondary | ICD-10-CM | POA: Diagnosis present

## 2021-11-13 DIAGNOSIS — L299 Pruritus, unspecified: Secondary | ICD-10-CM | POA: Diagnosis present

## 2021-11-13 DIAGNOSIS — R197 Diarrhea, unspecified: Secondary | ICD-10-CM | POA: Diagnosis present

## 2021-11-13 DIAGNOSIS — Z95828 Presence of other vascular implants and grafts: Secondary | ICD-10-CM

## 2021-11-13 DIAGNOSIS — R21 Rash and other nonspecific skin eruption: Secondary | ICD-10-CM | POA: Diagnosis not present

## 2021-11-13 DIAGNOSIS — T80211A Bloodstream infection due to central venous catheter, initial encounter: Secondary | ICD-10-CM | POA: Diagnosis not present

## 2021-11-13 DIAGNOSIS — L089 Local infection of the skin and subcutaneous tissue, unspecified: Secondary | ICD-10-CM | POA: Diagnosis present

## 2021-11-13 LAB — CBC WITH DIFFERENTIAL/PLATELET
Abs Immature Granulocytes: 0.04 10*3/uL (ref 0.00–0.07)
Basophils Absolute: 0 10*3/uL (ref 0.0–0.1)
Basophils Relative: 0 %
Eosinophils Absolute: 0.1 10*3/uL (ref 0.0–0.5)
Eosinophils Relative: 1 %
HCT: 34 % — ABNORMAL LOW (ref 36.0–46.0)
Hemoglobin: 12.2 g/dL (ref 12.0–15.0)
Immature Granulocytes: 1 %
Lymphocytes Relative: 8 %
Lymphs Abs: 0.6 10*3/uL — ABNORMAL LOW (ref 0.7–4.0)
MCH: 35.6 pg — ABNORMAL HIGH (ref 26.0–34.0)
MCHC: 35.9 g/dL (ref 30.0–36.0)
MCV: 99.1 fL (ref 80.0–100.0)
Monocytes Absolute: 0.1 10*3/uL (ref 0.1–1.0)
Monocytes Relative: 2 %
Neutro Abs: 6.3 10*3/uL (ref 1.7–7.7)
Neutrophils Relative %: 88 %
Platelets: 319 10*3/uL (ref 150–400)
RBC: 3.43 MIL/uL — ABNORMAL LOW (ref 3.87–5.11)
RDW: 13.5 % (ref 11.5–15.5)
WBC: 7.1 10*3/uL (ref 4.0–10.5)
nRBC: 0 % (ref 0.0–0.2)

## 2021-11-13 LAB — BASIC METABOLIC PANEL
Anion gap: 14 (ref 5–15)
BUN: 7 mg/dL — ABNORMAL LOW (ref 8–23)
CO2: 23 mmol/L (ref 22–32)
Calcium: 8.3 mg/dL — ABNORMAL LOW (ref 8.9–10.3)
Chloride: 96 mmol/L — ABNORMAL LOW (ref 98–111)
Creatinine, Ser: 0.78 mg/dL (ref 0.44–1.00)
GFR, Estimated: >60 mL/min (ref >60)
Glucose, Bld: 112 mg/dL — ABNORMAL HIGH (ref 70–99)
Potassium: 3 mmol/L — ABNORMAL LOW (ref 3.5–5.1)
Sodium: 133 mmol/L — ABNORMAL LOW (ref 135–145)

## 2021-11-13 LAB — RESP PANEL BY RT-PCR (FLU A&B, COVID) ARPGX2
Influenza A by PCR: NEGATIVE
Influenza B by PCR: NEGATIVE
SARS Coronavirus 2 by RT PCR: NEGATIVE

## 2021-11-13 LAB — LACTIC ACID, PLASMA
Lactic Acid, Venous: 1.1 mmol/L (ref 0.5–1.9)
Lactic Acid, Venous: 1 mmol/L (ref 0.5–1.9)

## 2021-11-13 LAB — BRAIN NATRIURETIC PEPTIDE: B Natriuretic Peptide: 88.3 pg/mL (ref 0.0–100.0)

## 2021-11-13 LAB — MAGNESIUM: Magnesium: 1.6 mg/dL — ABNORMAL LOW (ref 1.7–2.4)

## 2021-11-13 MED ORDER — VANCOMYCIN HCL IN DEXTROSE 1-5 GM/200ML-% IV SOLN: 1000.0000 mg | INTRAVENOUS | Status: DC

## 2021-11-13 MED ORDER — POTASSIUM CHLORIDE CRYS ER 20 MEQ PO TBCR
20.0000 meq | EXTENDED_RELEASE_TABLET | Freq: Every day | ORAL | Status: DC
  Administered 2021-11-13: 20 meq via ORAL
  Filled 2021-11-13: qty 1

## 2021-11-13 MED ORDER — ACETAMINOPHEN 650 MG RE SUPP: 650.0000 mg | Freq: Four times a day (QID) | RECTAL | Status: DC | PRN

## 2021-11-13 MED ORDER — POTASSIUM CHLORIDE CRYS ER 20 MEQ PO TBCR: 20.0000 meq | EXTENDED_RELEASE_TABLET | Freq: Two times a day (BID) | ORAL | Status: DC

## 2021-11-13 MED ORDER — FOLIC ACID 1 MG PO TABS
1.0000 mg | ORAL_TABLET | Freq: Every day | ORAL | Status: DC
Start: 2021-11-14 — End: 2021-11-19
  Administered 2021-11-14 – 2021-11-18 (×5): 1 mg via ORAL
  Filled 2021-11-13 (×5): qty 1

## 2021-11-13 MED ORDER — ENOXAPARIN SODIUM 40 MG/0.4ML IJ SOSY
40.0000 mg | PREFILLED_SYRINGE | INTRAMUSCULAR | Status: DC
  Administered 2021-11-13 – 2021-11-17 (×5): 40 mg via SUBCUTANEOUS
  Filled 2021-11-13 (×4): qty 0.4

## 2021-11-13 MED ORDER — ALBUTEROL SULFATE (2.5 MG/3ML) 0.083% IN NEBU
2.5000 mg | INHALATION_SOLUTION | Freq: Four times a day (QID) | RESPIRATORY_TRACT | Status: DC | PRN
  Administered 2021-11-14 – 2021-11-15 (×4): 2.5 mg via RESPIRATORY_TRACT
  Filled 2021-11-13 (×4): qty 3

## 2021-11-13 MED ORDER — DIPHENHYDRAMINE HCL 50 MG/ML IJ SOLN
25.0000 mg | Freq: Once | INTRAMUSCULAR | Status: AC
Start: 2021-11-13 — End: 2021-11-13
  Administered 2021-11-13: 25 mg via INTRAVENOUS
  Filled 2021-11-13: qty 1

## 2021-11-13 MED ORDER — SODIUM CHLORIDE 0.9 % IV SOLN
2.0000 g | Freq: Once | INTRAVENOUS | Status: AC
  Administered 2021-11-13: 2 g via INTRAVENOUS
  Filled 2021-11-13: qty 2

## 2021-11-13 MED ORDER — POTASSIUM CHLORIDE CRYS ER 20 MEQ PO TBCR
40.0000 meq | EXTENDED_RELEASE_TABLET | Freq: Once | ORAL | Status: AC
  Administered 2021-11-13: 40 meq via ORAL
  Filled 2021-11-13: qty 2

## 2021-11-13 MED ORDER — OXYCODONE HCL 5 MG PO TABS
5.0000 mg | ORAL_TABLET | ORAL | Status: DC | PRN
  Administered 2021-11-13: 10 mg via ORAL
  Administered 2021-11-15: 5 mg via ORAL
  Administered 2021-11-16: 10 mg via ORAL
  Filled 2021-11-13 (×2): qty 2
  Filled 2021-11-13: qty 1

## 2021-11-13 MED ORDER — ALPRAZOLAM 0.25 MG PO TABS
0.2500 mg | ORAL_TABLET | Freq: Two times a day (BID) | ORAL | Status: DC | PRN
  Filled 2021-11-13: qty 1

## 2021-11-13 MED ORDER — ONDANSETRON HCL 4 MG/2ML IJ SOLN
4.0000 mg | Freq: Four times a day (QID) | INTRAMUSCULAR | Status: DC | PRN
Start: 2021-11-13 — End: 2021-11-19

## 2021-11-13 MED ORDER — OXYCODONE HCL ER 15 MG PO T12A
15.0000 mg | EXTENDED_RELEASE_TABLET | Freq: Two times a day (BID) | ORAL | Status: DC
  Administered 2021-11-13 – 2021-11-18 (×10): 15 mg via ORAL
  Filled 2021-11-13 (×11): qty 1

## 2021-11-13 MED ORDER — ENSURE ENLIVE PO LIQD
237.0000 mL | Freq: Two times a day (BID) | ORAL | Status: DC
  Administered 2021-11-14 – 2021-11-16 (×2): 237 mL via ORAL

## 2021-11-13 MED ORDER — OLANZAPINE 5 MG PO TABS
2.5000 mg | ORAL_TABLET | Freq: Every day | ORAL | Status: DC
  Administered 2021-11-13 – 2021-11-17 (×5): 2.5 mg via ORAL
  Filled 2021-11-13 (×5): qty 1

## 2021-11-13 MED ORDER — ACETAMINOPHEN 325 MG PO TABS: 650.0000 mg | ORAL_TABLET | Freq: Four times a day (QID) | ORAL | Status: DC | PRN

## 2021-11-13 MED ORDER — POTASSIUM CHLORIDE CRYS ER 20 MEQ PO TBCR
40.0000 meq | EXTENDED_RELEASE_TABLET | Freq: Every day | ORAL | Status: DC
  Administered 2021-11-14: 40 meq via ORAL
  Filled 2021-11-13: qty 2

## 2021-11-13 MED ORDER — DIPHENHYDRAMINE HCL 50 MG/ML IJ SOLN
25.0000 mg | Freq: Four times a day (QID) | INTRAMUSCULAR | Status: DC | PRN
  Administered 2021-11-14: 25 mg via INTRAVENOUS
  Filled 2021-11-13: qty 1

## 2021-11-13 MED ORDER — VANCOMYCIN HCL IN DEXTROSE 1-5 GM/200ML-% IV SOLN
1000.0000 mg | Freq: Once | INTRAVENOUS | Status: AC
  Administered 2021-11-13: 1000 mg via INTRAVENOUS
  Filled 2021-11-13: qty 200

## 2021-11-13 MED ORDER — ONDANSETRON HCL 4 MG PO TABS: 4.0000 mg | ORAL_TABLET | Freq: Four times a day (QID) | ORAL | Status: DC | PRN

## 2021-11-13 NOTE — ED Provider Notes (Signed)
[facility] DEPT Provider Note   CSN: [id number] Arrival date & time: 11/13/21  1507     History  Chief Complaint  Patient presents with   Leg Swelling   Fever    LILAH MIJANGOS is a 66 y.o. female.  Patient is a 66 year old female who who presents with fever and leg swelling.  Per chart review, she has a history of metastatic adenocarcinoma of the lung.  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  She is currently undergoing chemotherapy with her last dose last Thursday.  She notes a 3-day history of swelling in her right leg.  She does note a prior history of DVT.  She also around the same time developed a rash.  It is on her legs, arms and chest.  It is itchy but she has been using cortisone cream with some improvement of the itchiness.  She also said that her nurse on Thursday had noticed a little bit of redness around her Port-A-Cath this past Thursday.  She has been running fevers over the last 3 days with a Tmax of 101.6.  She has a little bit of coughing and congestion.  She has some sores in her mouth but she says they have been there for a week or 2 and it was felt to be from the chemotherapy.  No sore throat.  No abdominal pain.  She has had some intermittent diarrhea.  No vomiting.  She did not start any new medicines other than she started a scopolamine patch for dizziness about 2 weeks ago.  No other new contacts.  No history of allergic reactions in the past.      Home Medications Prior to Admission medications   Medication Sig Start Date End Date Taking? Authorizing Provider  acetaminophen (TYLENOL) 500 MG tablet Take 1,000 mg by mouth every 8 (eight) hours as needed for moderate pain.    [provider]  albuterol (VENTOLIN HFA) 108 (90 Base) MCG/ACT inhaler INHALE 2 PUFFS INTO THE LUNGS EVERY 6 HOURS AS NEEDED FOR WHEEZING OR SHORTNESS OF BREATH Patient taking differently: Inhale 2 puffs into the lungs every 6 (six) hours as  needed for shortness of breath or wheezing. 11/23/20   Elby Showers, MD  ALPRAZolam Duanne Moron) 0.25 MG tablet Take 1 tablet (0.25 mg total) by mouth 2 (two) times daily as needed for anxiety 09/28/21   Orson Slick, MD  amLODipine (NORVASC) 10 MG tablet TAKE 1 TABLET BY MOUTH EVERY DAY 10/11/21   Orson Slick, MD  Cyanocobalamin (VITAMIN B12 PO) Take 2 tablets by mouth daily. Gummies    [provider]  cyclobenzaprine (FLEXERIL) 5 MG tablet Take 5 mg by mouth at bedtime. 04/13/21   [provider]  dexamethasone (DECADRON) 1 MG tablet Take 2 tablets by mouth daily. Patient not taking: Reported on 11/09/2021 09/12/21   Ventura Sellers, MD  folic acid (FOLVITE) 1 MG tablet Take 1 tablet by mouth daily. 10/16/21 10/16/22  Orson Slick, MD  furosemide (LASIX) 20 MG tablet Take 2 tablets (40 mg total) by mouth 2 times daily. 11/09/21   Orson Slick, MD  lidocaine-prilocaine (EMLA) cream Apply to port as needed. 02/17/21   Orson Slick, MD  meclizine (ANTIVERT) 25 MG tablet Take 1 tablet (25 mg total) by mouth 3 (three) times daily as needed for dizziness. 09/28/21   Ventura Sellers, MD  Multiple Vitamins-Minerals (AIRBORNE PO) Take 1 tablet  by mouth daily.     [provider]  ofloxacin (OCUFLOX) 0.3 % ophthalmic solution 2 drops each eye 4 times a day for 5 days Patient taking differently: Place 2 drops into both eyes daily as needed (irritation). 02/23/20   Elby Showers, MD  OLANZapine (ZYPREXA) 2.5 MG tablet TAKE 1 TABLET BY MOUTH EVERY NIGHT AT BEDTIME 07/24/21 07/24/22  Orson Slick, MD  ondansetron (ZOFRAN) 4 MG tablet TAKE 1 TO 2 TABLETS BY MOUTH 2 TIMES DAILY AS NEEDED 12/06/20 12/06/21  Marvis Moeller, NP  oxyCODONE (OXY IR/ROXICODONE) 5 MG immediate release tablet TAKE 1 TO 2 TABLETS BY MOUTH EVERY 4 HOURS AS NEEDED FOR SEVERE PAIN 10/21/21 04/19/22  Orson Slick, MD  pantoprazole (PROTONIX) 40 MG tablet TAKE 1 TABLET BY MOUTH ONCE DAILY 09/17/21    Elby Showers, MD  pentoxifylline (TRENTAL) 400 MG CR tablet TAKE 1 TABLET BY MOUTH WITH FOOD FOR 1 WEEK THEN TAKE 1 TABLET BY MOUTH 2 TIMES DAILY THEREAFTER Patient not taking: Reported on 11/09/2021 01/02/21 01/02/22  [name], MD  polyethylene glycol (MIRALAX / GLYCOLAX) 17 g packet Take 17 g by mouth 2 (two) times daily.     [provider]  potassium chloride SA (KLOR-CON M) 20 MEQ tablet Take 2 tablets in the morning and 1 tablet in the evening. 11/09/21   Orson Slick, MD  prochlorperazine (COMPAZINE) 10 MG tablet Take 10 mg by mouth every 6 (six) hours as needed for nausea or vomiting.    [provider]  promethazine (PHENERGAN) 12.5 MG tablet Take 1 to 2 tablets by mouth every 6 hours as needed for nausea 05/02/21     scopolamine (TRANSDERM-SCOP) 1 MG/3DAYS Place 1 patch (1.5 mg total) onto the skin every 3 (three) days. 10/31/21   Ventura Sellers, MD  Vitamin D3 (VITAMIN D) 25 MCG tablet Take 2,000 Units by mouth daily.    [provider]  vitamin E 180 MG (400 UNITS) capsule Take 400 IU daily x 1 week, then 400 IU BID Patient taking differently: Take 400 Units by mouth in the morning and at bedtime. Take 400 IU daily x 1 week, then 400 IU BID 08/22/20   [name], MD  XTAMPZA ER 13.5 MG C12A Take 1 capsule by mouth in the morning and at bedtime. (every 12 hours) 10/21/21 04/19/22  Orson Slick, MD      Allergies    Patient has no known allergies.    Review of Systems   Review of Systems  Constitutional:  Positive for fatigue and fever. Negative for chills and diaphoresis.  HENT:  Positive for congestion. Negative for rhinorrhea and sneezing.   Eyes: Negative.   Respiratory:  Positive for cough. Negative for chest tightness and shortness of breath.   Cardiovascular:  Positive for leg swelling. Negative for chest pain.  Gastrointestinal:  Positive for diarrhea. Negative for abdominal pain, blood in stool, nausea and vomiting.  Genitourinary:   Negative for difficulty urinating, flank pain, frequency and hematuria.  Musculoskeletal:  Negative for arthralgias and back pain.  Skin:  Negative for rash.  Neurological:  Negative for dizziness, speech difficulty, weakness, numbness and headaches.      Physical Exam Updated Vital Signs BP 125/85    Pulse 80    Temp 98.5 F (36.9 C) (Oral)    Resp 18    Ht 5\' 2"  (1.575 m)    Wt 78.6 kg  SpO2 98%    BMI 31.71 kg/m  Physical Exam Constitutional:      Appearance: She is well-developed.  HENT:     Head: Normocephalic and atraumatic.  Eyes:     Pupils: Pupils are equal, round, and reactive to light.  Cardiovascular:     Rate and Rhythm: Normal rate and regular rhythm.     Heart sounds: Normal heart sounds.  Pulmonary:     Effort: Pulmonary effort is normal. No respiratory distress.     Breath sounds: Normal breath sounds. No wheezing or rales.  Chest:     Chest wall: No tenderness.  Abdominal:     General: Bowel sounds are normal.     Palpations: Abdomen is soft.     Tenderness: There is no abdominal tenderness. There is no guarding or rebound.  Musculoskeletal:        General: Normal range of motion.     Cervical back: Normal range of motion and neck supple.  Lymphadenopathy:     Cervical: No cervical adenopathy.  Skin:    General: Skin is warm and dry.     Findings: No rash.     Comments: Patient has diffuse urticarial type rash.  It is blanching.  There is no petechiae or purpura.  No vesicles.  She does have a small pustule overlying her Port-A-Cath.  There is some erythema overlying it but this is in conjunction with her systemic rash.  She does have some swelling of her right leg which is slightly more swollen than her left leg.  No calf tenderness.  She does have some erythema of the lower leg but also has the overlying rash.  Neurological:     General: No focal deficit present.     Mental Status: She is alert and oriented to person, place, and time.    ED Results  / Procedures / Treatments   Labs (all labs ordered are listed, but only abnormal results are displayed) Labs Reviewed  BASIC METABOLIC PANEL - Abnormal; Notable for the following components:      Result Value   Sodium 133 (*)    Potassium 3.0 (*)    Chloride 96 (*)    Glucose, Bld 112 (*)    BUN 7 (*)    Calcium 8.3 (*)    All other components within normal limits  CBC WITH DIFFERENTIAL/PLATELET - Abnormal; Notable for the following components:   RBC 3.43 (*)    HCT 34.0 (*)    MCH 35.6 (*)    All other components within normal limits  RESP PANEL BY RT-PCR (FLU A&B, COVID) ARPGX2  CULTURE, BLOOD (ROUTINE X 2)  CULTURE, BLOOD (ROUTINE X 2)  BRAIN NATRIURETIC PEPTIDE  LACTIC ACID, PLASMA  LACTIC ACID, PLASMA    EKG None  Radiology DG Chest Port 1 View  Result Date: 11/13/2021 CLINICAL DATA:  Cough EXAM: PORTABLE CHEST 1 VIEW COMPARISON:  Chest x-ray 12/17/2019 FINDINGS: Cardiomediastinal silhouette is stable and within normal limits. Mild calcified plaques in the aortic arch. Right-sided central venous port with the tip in the SVC. No focal consolidation identified in the lungs. No pleural effusion or pneumothorax. IMPRESSION: No acute intrathoracic process identified. Electronically Signed   By: Ofilia Neas M.D.   On: 11/13/2021 17:17   VAS [name] LOWER EXTREMITY VENOUS (DVT) (7a-7p)  Result Date: 11/13/2021  Lower Venous DVT Study Patient Name:  CAILYNN BODNAR  Date of Exam:   11/13/2021 Medical Rec #: [id number]  Accession #:    [id number] Date of Birth: Jan 12, 1956         Patient Gender: F Patient Age:   30 years Exam Location:  [facility] Procedure:      VAS [name] LOWER EXTREMITY VENOUS (DVT) Referring Phys: Domenic Moras --------------------------------------------------------------------------------  Indications: Swelling.  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 Comparison Study: Previous exam on 03/19/2020 was negative for DVT  Performing Technologist: Rogelia Rohrer RVT, RDMS  Examination Guidelines: A complete evaluation includes B-mode imaging, spectral Doppler, color Doppler, and power Doppler as needed of all accessible portions of each vessel. Bilateral testing is considered an integral part of a complete examination. Limited examinations for reoccurring indications may be performed as noted. The reflux portion of the exam is performed with the patient in reverse Trendelenburg.  +---------+---------------+---------+-----------+----------+-------------------+  RIGHT     Compressibility Phasicity Spontaneity Properties Thrombus Aging       +---------+---------------+---------+-----------+----------+-------------------+  CFV       Full            Yes       Yes                                         +---------+---------------+---------+-----------+----------+-------------------+  SFJ       Full                                                                  +---------+---------------+---------+-----------+----------+-------------------+  FV Prox   Full            Yes       Yes                                         +---------+---------------+---------+-----------+----------+-------------------+  FV Mid    Full            Yes       Yes                                         +---------+---------------+---------+-----------+----------+-------------------+  FV Distal Full            Yes       Yes                                         +---------+---------------+---------+-----------+----------+-------------------+  PFV       Full                                                                  +---------+---------------+---------+-----------+----------+-------------------+  POP       Full  Yes       Yes                                         +---------+---------------+---------+-----------+----------+-------------------+  PTV       Full                                                                   +---------+---------------+---------+-----------+----------+-------------------+  PERO      Full                                             Not well visualized  +---------+---------------+---------+-----------+----------+-------------------+   +----+---------------+---------+-----------+----------+--------------+  LEFT Compressibility Phasicity Spontaneity Properties Thrombus Aging  +----+---------------+---------+-----------+----------+--------------+  CFV  Full            Yes       Yes                                    +----+---------------+---------+-----------+----------+--------------+     Summary: RIGHT: - There is no evidence of deep vein thrombosis in the lower extremity. - There is no evidence of superficial venous thrombosis.  - No cystic structure found in the popliteal fossa. - Ultrasound characteristics of enlarged lymph nodes are noted in the groin. -Subcutaneous edema seen in area of calf.  LEFT: - No evidence of common femoral vein obstruction. - Ultrasound characteristics of enlarged lymph nodes noted in the groin.  *See table(s) above for measurements and observations. Electronically signed by Servando Snare MD on 11/13/2021 at 5:15:23 PM.    Final     Procedures Procedures    Medications Ordered in ED Medications  vancomycin (VANCOCIN) IVPB 1000 mg/200 mL premix (1,000 mg Intravenous New Bag/Given 11/13/21 1801)  ceFEPIme (MAXIPIME) 2 g in sodium chloride 0.9 % 100 mL IVPB (0 g Intravenous Stopped 11/13/21 1801)  potassium chloride SA (KLOR-CON M) CR tablet 40 mEq (40 mEq Oral Given 11/13/21 1752)  diphenhydrAMINE (BENADRYL) injection 25 mg (25 mg Intravenous Given 11/13/21 1809)    ED Course/ Medical Decision Making/ A&P                           Medical Decision Making Amount and/or Complexity of Data Reviewed Independent Historian: spouse External Data Reviewed: notes. Labs: ordered. Decision-making details documented in ED Course. Radiology: ordered.  Risk Prescription drug  management. Decision regarding hospitalization.   Patient is a 66 year old female who is currently undergoing chemotherapy for metastatic lung cancer who presents with a febrile illness.  She does have some swelling and erythema to her right lower leg.  There is no evidence of DVT on imaging.  This could represent a cellulitis although she has a diffuse overlying maculopapular type rash so it is hard to ascertain what is rash and what is the erythema.  She also has some concerns for infection over her Port-A-Cath with a small pustule.  Her labs show a normal WBC count.  Her lactate  is normal.  Her potassium is slightly low and she was given oral potassium replacement.  There does not appear to be concerns for sepsis.  Her chest x-ray which was interpreted by me shows no evidence of pneumonia.  No pulmonary edema.  Her COVID test is negative.  She was started on IV antibiotics.  Blood cultures were obtained.  I spoke with Dr. Posey Pronto who will admit the patient for further treatment.  Final Clinical Impression(s) / ED Diagnoses Final diagnoses:  Cellulitis of right lower extremity  Febrile illness  Hypokalemia    Rx / DC Orders ED Discharge Orders     None         Malvin Johns, MD 11/13/21 1851

## 2021-11-13 NOTE — Progress Notes (Signed)
RLE venous duplex has been completed.  Preliminary results messaged to Dr. Tamera Punt via secure chat. ? ?Results can be found under chart review under CV PROC. ?11/13/2021 4:35 PM ?Regina Coppolino RVT, RDMS ? ?

## 2021-11-13 NOTE — Progress Notes (Signed)
Pharmacy Antibiotic Note ? ?Virginia [name] is a 66 y.o. female admitted on 11/13/2021 with cellulitis.  Pharmacy has been consulted for Vancomycin dosing. ? ?Plan: ?Vancomycin 1g IV q24h (SCr rounded 0.8, eAUC 498) ?Measure Vanc levels as needed.  Goal AUC = 400 - 550.   ?Follow up renal function, culture results, and clinical course. ? ? ?Height: 5\' 2"  (157.5 cm) ?Weight: 78.6 kg (173 lb 6 oz) ?IBW/kg (Calculated) : 50.1 ? ?Temp (24hrs), Avg:98.2 ?F (36.8 ?C), Min:97.9 ?F (36.6 ?C), Max:98.5 ?F (36.9 ?C) ? ?Recent Labs  ?Lab 11/09/21 ?1029 11/13/21 ?1550 11/13/21 ?1558 11/13/21 ?1720  ?WBC 5.8 7.1  --   --   ?CREATININE 0.61 0.78  --   --   ?LATICACIDVEN  --   --  1.1 1.0  ?  ?Estimated Creatinine Clearance: 69 mL/min (by C-G formula based on SCr of 0.78 mg/dL).   ? ?No Known Allergies ? ?Antimicrobials this admission: ?3/6 Cefepime ?3/6 Vancomycin >>  ? ?Dose adjustments this admission: ? ? ?Microbiology results: ?3/6 BCx:  ? ?Thank you for allowing pharmacy to be a part of this patient?s care. ? ?Gretta Arab PharmD, BCPS ?Clinical Pharmacist ?[facility] [phone] ?11/13/2021 8:35 PM ? ? ?

## 2021-11-13 NOTE — H&P (Signed)
History and Physical    Virginia [name] EHO:[id number] DOB: 08-02-1956 DOA: 11/13/2021  PCP: Orson Slick, MD  Patient coming from: Home  I have personally briefly reviewed patient's old medical records in [place]  Chief Complaint: Fever, leg swelling  HPI: Virginia [name] is a 66 y.o. 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  Patient reports 3 days of URI symptoms with rhinorrhea, cough, and fevers at home with temperature up to 101 F.  She developed a pruritic red rash involving her chest and all extremities.  She felt that erythema was increased on right leg compared to left.  She does have chronic edema of her lower extremities and history of blood clot in her left leg.  She is concerned that she was developing another blood clot therefore came to the ED for further evaluation.  She does report intermittent diarrhea and nausea without emesis.  Appetite has been low.  He denies any abdominal pain.  She denies any chest pain or dyspnea.  Patient states the only new medication she is using a scopolamine patch which was started about 2 weeks ago for dizziness.  ED Course   Labs/Imaging on admission: I have personally reviewed following labs and imaging studies.  Initial vitals showed BP 111/72, pulse 77, RR 18, temp 97.9 F, SPO2 98% on room air.  Labs show WBC 7.1, hemoglobin 12.2, platelets 319,000, sodium 133, potassium 3.0, bicarb 23, BUN 7, creatinine 0.78, serum glucose 112, BNP 88.3, lactic acid 1.1 > 1.0.  SARS-CoV-2 and influenza PCR negative.  Blood cultures collected and pending.  Portable chest x-ray negative for acute intrathoracic process.  Right-sided  Port-A-Cath seen with tip in SVC.  RLE venous ultrasound was negative for DVT.  Patient was given IV vancomycin and cefepime, IV Benadryl, oral potassium 40 mEq.  The hospitalist service was consulted to admit for further evaluation and management.  Review of Systems: All systems reviewed and are negative except as documented in history of present illness above.   Past Medical History:  Diagnosis Date   Anemia    Anxiety    Concussion 09/28/2019   DVT (deep venous thrombosis) ([facility]) 2021   L leg   Dyspnea    GERD (gastroesophageal reflux disease)    Hypercholesterolemia    per pt, she does not have elevated lipids   Hypertension    met lung ca dx'd 09/2019   mets to spine, hip and brain   PONV (postoperative nausea and vomiting)    Tobacco abuse     Past Surgical History:  Procedure Laterality Date   ABDOMINAL HYSTERECTOMY     partial/ left ovaries   APPLICATION OF CRANIAL NAVIGATION N/A 12/02/2020   Procedure: APPLICATION OF CRANIAL NAVIGATION;  Surgeon: Erline Levine, MD;  Location: [facility];  Service: Neurosurgery;  Laterality: N/A;   CHOLECYSTECTOMY     CRANIOTOMY N/A 12/02/2020   Procedure: Posterior fossa craniotomy for tumor resection with brainlab;  Surgeon: Erline Levine, MD;  Location: [facility];  Service: Neurosurgery;  Laterality: N/A;   DILATION AND CURETTAGE OF UTERUS     IR IMAGING GUIDED PORT INSERTION  10/23/2019   KYPHOPLASTY N/A 03/15/2020   Procedure: Thoracic Eight KYPHOPLASTY;  Surgeon: Erline Levine, MD;  Location: [facility];  Service: Neurosurgery;  Laterality: N/A;  prone    LIPOMA EXCISION  2018   removed under left breast and right thigh.   TUBAL LIGATION      Social History:  reports that she quit smoking about 9 years ago. Her smoking use included cigarettes. She smoked an average of .25 packs per day. She has never used smokeless tobacco. She reports current alcohol use. She reports that she does not use drugs.  No Known Allergies  Family History   Problem Relation Age of Onset   Heart disease Father    Drug abuse Daughter    Drug abuse Son    Cancer Sister    Heart disease Brother    Heart attack Brother      Prior to Admission medications   Medication Sig Start Date End Date Taking? Authorizing Provider  acetaminophen (TYLENOL) 500 MG tablet Take 1,000 mg by mouth every 8 (eight) hours as needed for moderate pain.    [provider]  albuterol (VENTOLIN HFA) 108 (90 Base) MCG/ACT inhaler INHALE 2 PUFFS INTO THE LUNGS EVERY 6 HOURS AS NEEDED FOR WHEEZING OR SHORTNESS OF BREATH Patient taking differently: Inhale 2 puffs into the lungs every 6 (six) hours as needed for shortness of breath or wheezing. 11/23/20   Elby Showers, MD  ALPRAZolam Duanne Moron) 0.25 MG tablet Take 1 tablet (0.25 mg total) by mouth 2 (two) times daily as needed for anxiety 09/28/21   Orson Slick, MD  amLODipine (NORVASC) 10 MG tablet TAKE 1 TABLET BY MOUTH EVERY DAY 10/11/21   Orson Slick, MD  Cyanocobalamin (VITAMIN B12 PO) Take 2 tablets by mouth daily. Gummies    [provider]  cyclobenzaprine (FLEXERIL) 5 MG tablet Take 5 mg by mouth at bedtime. 04/13/21   [provider]  dexamethasone (DECADRON) 1 MG tablet Take 2 tablets by mouth daily. Patient not taking: Reported on 11/09/2021 09/12/21   Ventura Sellers, MD  folic acid (FOLVITE) 1 MG tablet Take 1 tablet by mouth daily. 10/16/21 10/16/22  Orson Slick, MD  furosemide (LASIX) 20 MG tablet Take 2 tablets (40 mg total) by mouth 2 times daily. 11/09/21   Orson Slick, MD  lidocaine-prilocaine (EMLA) cream Apply to port as needed. 02/17/21   Orson Slick, MD  meclizine (ANTIVERT) 25 MG tablet Take 1 tablet (25 mg total) by mouth 3 (three) times daily as needed for dizziness. 09/28/21   Ventura Sellers, MD  Multiple Vitamins-Minerals (AIRBORNE PO) Take 1 tablet by mouth daily.     [provider]  ofloxacin (OCUFLOX) 0.3 % ophthalmic solution 2 drops each eye  4 times a day for 5 days Patient taking differently: Place 2 drops into both eyes daily as needed (irritation). 02/23/20   Elby Showers, MD  OLANZapine (ZYPREXA) 2.5 MG tablet TAKE 1 TABLET BY MOUTH EVERY NIGHT AT BEDTIME 07/24/21 07/24/22  Orson Slick, MD  ondansetron (ZOFRAN) 4 MG tablet TAKE 1 TO 2 TABLETS BY MOUTH 2 TIMES DAILY AS NEEDED 12/06/20 12/06/21  Marvis Moeller, NP  oxyCODONE (OXY IR/ROXICODONE) 5 MG immediate release tablet TAKE 1 TO 2 TABLETS BY MOUTH EVERY 4 HOURS AS NEEDED FOR SEVERE PAIN 10/21/21 04/19/22  Orson Slick, MD  pantoprazole (PROTONIX) 40 MG tablet TAKE 1 TABLET BY MOUTH ONCE DAILY 09/17/21   Elby Showers, MD  pentoxifylline (TRENTAL) 400 MG CR tablet TAKE 1  TABLET BY MOUTH WITH FOOD FOR 1 WEEK THEN TAKE 1 TABLET BY MOUTH 2 TIMES DAILY THEREAFTER Patient not taking: Reported on 11/09/2021 01/02/21 01/02/22  [name], MD  polyethylene glycol (MIRALAX / GLYCOLAX) 17 g packet Take 17 g by mouth 2 (two) times daily.     [provider]  potassium chloride SA (KLOR-CON M) 20 MEQ tablet Take 2 tablets in the morning and 1 tablet in the evening. 11/09/21   Orson Slick, MD  prochlorperazine (COMPAZINE) 10 MG tablet Take 10 mg by mouth every 6 (six) hours as needed for nausea or vomiting.    [provider]  promethazine (PHENERGAN) 12.5 MG tablet Take 1 to 2 tablets by mouth every 6 hours as needed for nausea 05/02/21     scopolamine (TRANSDERM-SCOP) 1 MG/3DAYS Place 1 patch (1.5 mg total) onto the skin every 3 (three) days. 10/31/21   Ventura Sellers, MD  Vitamin D3 (VITAMIN D) 25 MCG tablet Take 2,000 Units by mouth daily.    [provider]  vitamin E 180 MG (400 UNITS) capsule Take 400 IU daily x 1 week, then 400 IU BID Patient taking differently: Take 400 Units by mouth in the morning and at bedtime. Take 400 IU daily x 1 week, then 400 IU BID 08/22/20   [name], MD  XTAMPZA ER 13.5 MG C12A Take 1 capsule by mouth in the  morning and at bedtime. (every 12 hours) 10/21/21 04/19/22  Orson Slick, MD    Physical Exam: Vitals:   11/13/21 1915 11/13/21 2115 11/13/21 2145 11/13/21 2225  BP: 126/86 126/89 139/81 (!) 146/94  Pulse: (!) 107 98 (!) 104 (!) 110  Resp:    18  Temp:    98.7 F (37.1 C)  TempSrc:    Oral  SpO2: 98% 97% 97% 99%  Weight:      Height:       Constitutional: Resting supine in bed, NAD, calm, comfortable Eyes: PERRL, lids and conjunctivae normal ENMT: Mucous membranes are moist. Posterior pharynx clear of any exudate or lesions.Normal dentition.  Neck: normal, supple, no masses. Respiratory: clear to auscultation bilaterally, no wheezing, no crackles. Normal respiratory effort. No accessory muscle use.  Cardiovascular: Regular rate and rhythm, no murmurs / rubs / gallops.  +1 bilateral lower extremity edema. 2+ pedal pulses. Abdomen: no tenderness, no masses palpated. No hepatosplenomegaly. Bowel sounds positive.  Musculoskeletal: no clubbing / cyanosis. No joint deformity upper and lower extremities. Good ROM, no contractures. Normal muscle tone.  Skin: Port-A-Cath in place right upper chest wall, small pustule seen at lower border of port.  Blanching erythematous rash involving chest, bilateral upper and lower extremities, no raised lesions, ulcers, active drainage.  Increased erythema right leg compared to left. Neurologic: CN 2-12 grossly intact. Sensation intact. Strength 5/5 in all 4.  Psychiatric: Normal judgment and insight. Alert and oriented x 3. Normal mood.       EKG: Not performed.  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 female with medical history significant for metastatic adenocarcinoma of the lung with osseous and brain  metastases on active treatment (Keytruda, Alimta last infusion 11/09/2021), history of LLE DVT (Eliquis discontinued due to brain bleed 04/2020),  HTN, anxiety, chronic pain who is admitted with febrile illness associated with rash and RLE cellulitis.  Assessment and Plan: * Febrile illness Skin rash Patient with fevers at home up to 101 F in setting of URI symptoms.  Has associated skin rash on chest and all extremities with increased erythema to the right lower extremity.  Suspect rash secondary to viral URI however given immunosuppressed status will continue antibiotics due to possible superimposed cellulitis to the right leg as well as pustule at the Port-A-Cath. 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  Follows with oncology, Dr. Lorenso Courier.  Hypokalemia Supplement.  Magnesium 1.6, will supplement.  Cancer associated pain Continue OxyContin every 12 hours and Oxy IR as needed.  Port-A-Cath in place Small pustule noted at Port-A-Cath site as above.  Continue vancomycin and follow blood cultures.  Hypertension Hold amlodipine for now, could be cause of her lower extremity edema.   DVT prophylaxis: enoxaparin (LOVENOX) injection 40 mg Start: 11/13/21 2200 Code Status: Full code, confirmed with patient Family Communication: Discussed with patient, she has discussed with family Disposition Plan: From home and likely discharge to home pending clinical progress. Consults called: None Severity of Illness: The appropriate patient status for this patient is OBSERVATION. Observation status is judged to be reasonable and necessary in order to provide the required intensity of service to ensure the patient's safety. The patient's presenting symptoms, physical exam findings, and initial radiographic and  laboratory data in the context of their medical condition is felt to place them at decreased risk for further clinical deterioration. Furthermore, it is anticipated that the patient will be medically stable for discharge from the hospital within 2 midnights of admission.   Zada Finders MD [facility]  If 7PM-7AM, please contact night-coverage [email or web address]  11/14/2021, 12:38 AM

## 2021-11-13 NOTE — Telephone Encounter (Signed)
Received call from pt's husband, Virginia [name]. He states Virginia [name] developed a fever on Friday, 11/10/21 of 101.6 and has had lower grade fevers during the rest of the weekend. Virginia [name] did give her Tylenol every 6 hours much of the time and that it helped keep her temperature down. He also states that, despite increase in Lasix, her legs are swollen R>L  He states that she has some reddish pink areas near her right ankle that are warm to the touch. Generally, he states that her legs are very uncomfortable.  He is concerned about infection and/or  blood clots.  ?Discussed the above with Dr. Lorenso Courier. He recommends that she be evaluated in the ED.  ?Advises the above to [name] and he and pt agree to this. They will go to [facility] today. ?

## 2021-11-13 NOTE — ED Provider Triage Note (Signed)
Emergency Medicine Provider Triage Evaluation Note ? ?Virginia [name] , a 66 y.o. female  was evaluated in triage.  Pt complains of leg swelling.  Pt with hx of lung CA, undergoing chemo currently.  Has RLE swelling for the past few days, also an itchy rash throughout body.  Report subjective fever.   ? ?Review of Systems  ?Positive: Fever, rash, leg swelling ?Negative: Cp, sob ? ?Physical Exam  ?BP 111/72 (BP Location: Left Arm)   Pulse 77   Temp 97.9 ?F (36.6 ?C) (Oral)   Resp 18   SpO2 98%  ?Gen:   Awake, no distress   ?Resp:  Normal effort  ?MSK:   Moves extremities without difficulty, edema to RLE>LLE ?Other:  Portacath site to R chest with a small purulent blister noted, nontender ? ?Medical Decision Making  ?Medically screening exam initiated at 3:23 PM.  Appropriate orders placed.  Virginia [name] was informed that the remainder of the evaluation will be completed by another provider, this initial triage assessment does not replace that evaluation, and the importance of remaining in the ED until their evaluation is complete. ? ?Has RLE edema, concerning for DVT.  Also report subjective fever, and the portacath site has a small purulent blister.  ?  ?Virginia Moras, [name] ?11/13/21 1525 ? ?

## 2021-11-13 NOTE — ED Triage Notes (Signed)
Pt reports right leg swelling, low grade fever, and rash on legs and chest since Friday. Pt reports last chemo treatment was Thursday. Pt was sent here for rule out DVT. It also appears that patient has pus coming out of her port. Pt denies any pain.  ?

## 2021-11-14 DIAGNOSIS — C3490 Malignant neoplasm of unspecified part of unspecified bronchus or lung: Secondary | ICD-10-CM

## 2021-11-14 DIAGNOSIS — C7931 Secondary malignant neoplasm of brain: Secondary | ICD-10-CM

## 2021-11-14 DIAGNOSIS — E7800 Pure hypercholesterolemia, unspecified: Secondary | ICD-10-CM | POA: Diagnosis present

## 2021-11-14 DIAGNOSIS — R509 Fever, unspecified: Secondary | ICD-10-CM | POA: Diagnosis not present

## 2021-11-14 DIAGNOSIS — K219 Gastro-esophageal reflux disease without esophagitis: Secondary | ICD-10-CM | POA: Diagnosis present

## 2021-11-14 DIAGNOSIS — Z95828 Presence of other vascular implants and grafts: Secondary | ICD-10-CM

## 2021-11-14 DIAGNOSIS — L299 Pruritus, unspecified: Secondary | ICD-10-CM | POA: Diagnosis present

## 2021-11-14 DIAGNOSIS — E871 Hypo-osmolality and hyponatremia: Secondary | ICD-10-CM | POA: Diagnosis present

## 2021-11-14 DIAGNOSIS — Z8249 Family history of ischemic heart disease and other diseases of the circulatory system: Secondary | ICD-10-CM | POA: Diagnosis not present

## 2021-11-14 DIAGNOSIS — C7951 Secondary malignant neoplasm of bone: Secondary | ICD-10-CM

## 2021-11-14 DIAGNOSIS — T80212A Local infection due to central venous catheter, initial encounter: Secondary | ICD-10-CM | POA: Diagnosis present

## 2021-11-14 DIAGNOSIS — T451X5A Adverse effect of antineoplastic and immunosuppressive drugs, initial encounter: Secondary | ICD-10-CM | POA: Diagnosis present

## 2021-11-14 DIAGNOSIS — E861 Hypovolemia: Secondary | ICD-10-CM | POA: Diagnosis present

## 2021-11-14 DIAGNOSIS — L089 Local infection of the skin and subcutaneous tissue, unspecified: Secondary | ICD-10-CM | POA: Diagnosis present

## 2021-11-14 DIAGNOSIS — R197 Diarrhea, unspecified: Secondary | ICD-10-CM | POA: Diagnosis present

## 2021-11-14 DIAGNOSIS — I10 Essential (primary) hypertension: Secondary | ICD-10-CM | POA: Diagnosis present

## 2021-11-14 DIAGNOSIS — C7800 Secondary malignant neoplasm of unspecified lung: Secondary | ICD-10-CM | POA: Diagnosis present

## 2021-11-14 DIAGNOSIS — R21 Rash and other nonspecific skin eruption: Secondary | ICD-10-CM | POA: Diagnosis present

## 2021-11-14 DIAGNOSIS — E876 Hypokalemia: Secondary | ICD-10-CM | POA: Diagnosis present

## 2021-11-14 DIAGNOSIS — G893 Neoplasm related pain (acute) (chronic): Secondary | ICD-10-CM

## 2021-11-14 DIAGNOSIS — Z20822 Contact with and (suspected) exposure to covid-19: Secondary | ICD-10-CM | POA: Diagnosis present

## 2021-11-14 DIAGNOSIS — Y828 Other medical devices associated with adverse incidents: Secondary | ICD-10-CM | POA: Diagnosis present

## 2021-11-14 DIAGNOSIS — L98498 Non-pressure chronic ulcer of skin of other sites with other specified severity: Secondary | ICD-10-CM | POA: Diagnosis present

## 2021-11-14 DIAGNOSIS — Z87891 Personal history of nicotine dependence: Secondary | ICD-10-CM | POA: Diagnosis not present

## 2021-11-14 DIAGNOSIS — R110 Nausea: Secondary | ICD-10-CM | POA: Diagnosis present

## 2021-11-14 DIAGNOSIS — Z86718 Personal history of other venous thrombosis and embolism: Secondary | ICD-10-CM | POA: Diagnosis not present

## 2021-11-14 DIAGNOSIS — D849 Immunodeficiency, unspecified: Secondary | ICD-10-CM | POA: Diagnosis present

## 2021-11-14 LAB — CBC
HCT: 28.8 % — ABNORMAL LOW (ref 36.0–46.0)
Hemoglobin: 10.1 g/dL — ABNORMAL LOW (ref 12.0–15.0)
MCH: 35.3 pg — ABNORMAL HIGH (ref 26.0–34.0)
MCHC: 35.1 g/dL (ref 30.0–36.0)
MCV: 100.7 fL — ABNORMAL HIGH (ref 80.0–100.0)
Platelets: 224 10*3/uL (ref 150–400)
RBC: 2.86 MIL/uL — ABNORMAL LOW (ref 3.87–5.11)
RDW: 13.2 % (ref 11.5–15.5)
WBC: 4.8 10*3/uL (ref 4.0–10.5)
nRBC: 0 % (ref 0.0–0.2)

## 2021-11-14 LAB — BASIC METABOLIC PANEL
Anion gap: 12 (ref 5–15)
BUN: 7 mg/dL — ABNORMAL LOW (ref 8–23)
CO2: 23 mmol/L (ref 22–32)
Calcium: 7.7 mg/dL — ABNORMAL LOW (ref 8.9–10.3)
Chloride: 95 mmol/L — ABNORMAL LOW (ref 98–111)
Creatinine, Ser: 0.69 mg/dL (ref 0.44–1.00)
GFR, Estimated: >60 mL/min (ref >60)
Glucose, Bld: 107 mg/dL — ABNORMAL HIGH (ref 70–99)
Potassium: 3.6 mmol/L (ref 3.5–5.1)
Sodium: 130 mmol/L — ABNORMAL LOW (ref 135–145)

## 2021-11-14 LAB — MAGNESIUM: Magnesium: 2.2 mg/dL (ref 1.7–2.4)

## 2021-11-14 LAB — OSMOLALITY: Osmolality: 281 mOsm/kg (ref 275–295)

## 2021-11-14 LAB — HIV ANTIBODY (ROUTINE TESTING W REFLEX): HIV Screen 4th Generation wRfx: NONREACTIVE

## 2021-11-14 MED ORDER — POTASSIUM CHLORIDE CRYS ER 20 MEQ PO TBCR
20.0000 meq | EXTENDED_RELEASE_TABLET | Freq: Two times a day (BID) | ORAL | Status: DC
  Administered 2021-11-14 – 2021-11-18 (×8): 20 meq via ORAL
  Filled 2021-11-14 (×8): qty 1

## 2021-11-14 MED ORDER — PREDNISONE 20 MG PO TABS
40.0000 mg | ORAL_TABLET | Freq: Every day | ORAL | Status: DC
  Administered 2021-11-14 – 2021-11-15 (×2): 40 mg via ORAL
  Filled 2021-11-14 (×2): qty 2

## 2021-11-14 MED ORDER — DIPHENHYDRAMINE HCL 25 MG PO CAPS
25.0000 mg | ORAL_CAPSULE | Freq: Three times a day (TID) | ORAL | Status: DC
  Administered 2021-11-14 – 2021-11-15 (×3): 25 mg via ORAL
  Filled 2021-11-14 (×3): qty 1

## 2021-11-14 MED ORDER — POLYETHYLENE GLYCOL 3350 17 G PO PACK
17.0000 g | PACK | Freq: Every day | ORAL | Status: DC | PRN
  Administered 2021-11-15: 17 g via ORAL
  Filled 2021-11-14: qty 1

## 2021-11-14 MED ORDER — MAGNESIUM SULFATE 2 GM/50ML IV SOLN
2.0000 g | Freq: Once | INTRAVENOUS | Status: AC
  Administered 2021-11-14: 2 g via INTRAVENOUS
  Filled 2021-11-14: qty 50

## 2021-11-14 NOTE — Progress Notes (Signed)
[facility] ? ?PROGRESS NOTE ? ?AERYN MEDICI CHE:[id number] DOB: 1955-12-30 DOA: 11/13/2021 ?PCP: Orson Slick, MD ? ?Brief hospital course ? ?Virginia [name] is a 66 y.o. 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  ? ? ?Subjective  ? ?Patient seen and examined, states that no rash developed around her neck and chest wall along with both lower extremities.  No fever. ? ?  ? ?Assessment and Plan: ? ?* Febrile illness ?Skin rash ?-Patient said she had fever of 101 at home in setting of upper respiratory symptoms ?-She has been afebrile in the hospital ?-Rash seems more like a side effect of medication, she was started on scopolamine patch 2 days before appearance of rash ?-She also had chemotherapy on 11/09/2021 ?-Start prednisone 40 mg daily ?-Start scheduled Benadryl 25 mg every 8 hours ?-No clear source of infection, chest x-ray is clear, urine and blood cultures are negative to date ?-She was empirically started on vancomycin and cefepime ?-We will discontinue antibiotics and monitor her in the hospital ? ? 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   ?-Follows with oncology ?- Dr. Lorenso Courier following in the hospital ? ?Hypokalemia ?- Replete ? ?Cancer associated pain ?Continue OxyContin every 12 hours and Oxy IR as needed. ? ?Port-A-Cath in place ?Small pustule noted at Port-A-Cath site  ?-No erythema around pustule noted; does not appear infected ? ?Hypertension ?Hold amlodipine for now, could be cause of her lower extremity  edema. ? ? ? ? ?Medications ? ?  ? enoxaparin (LOVENOX) injection  40 mg Subcutaneous Q24H  ? feeding supplement  237 mL Oral BID BM  ? folic acid  1 mg Oral Daily  ? OLANZapine  2.5 mg Oral QHS  ? oxyCODONE  15 mg Oral Q12H  ? potassium chloride  20 mEq Oral BID  ? predniSONE  40 mg Oral Q breakfast  ? ? ? Data Reviewed:  ? ?CBG: ? ?No results for input(s): GLUCAP in the last 168 hours. ? ?SpO2: 92 %  ? ? ?Vitals:  ? 11/14/21 0128 11/14/21 0551 11/14/21 0940 11/14/21 1349  ?BP:  129/87 127/70 128/83  ?Pulse:  (!) 110 (!) 105 (!) 106  ?Resp:  18 16 18   ?Temp:  98.2 ?F (36.8 ?C) 98.3 ?F (36.8 ?C) 98.2 ?F (36.8 ?C)  ?TempSrc:  Oral Oral Oral  ?SpO2: 94% 95% 96% 92%  ?Weight:      ?Height:      ? ? ? ? ?Data Reviewed: ? ?Basic Metabolic Panel: ?Recent Labs  ?Lab 11/09/21 ?1029 11/13/21 ?1550 11/14/21 ?0616  ?NA 138 133* 130*  ?K 3.1* 3.0* 3.6  ?CL 104 96* 95*  ?CO2 29 23 23   ?GLUCOSE 124* 112* 107*  ?BUN 5* 7* 7*  ?CREATININE 0.61 0.78 0.69  ?CALCIUM 8.1* 8.3* 7.7*  ?MG  --  1.6* 2.2  ? ? ?CBC: ?Recent Labs  ?Lab 11/09/21 ?1029 11/13/21 ?1550 11/14/21 ?0616  ?WBC 5.8 7.1 4.8  ?NEUTROABS 3.1 6.3  --   ?HGB 11.5* 12.2 10.1*  ?HCT 32.8* 34.0* 28.8*  ?MCV 100.3* 99.1 100.7*  ?PLT 264 319 224  ? ? ?  LFT ?Recent Labs  ?Lab 11/09/21 ?1029  ?AST 20  ?ALT 18  ?ALKPHOS 68  ?BILITOT 0.6  ?PROT 6.5  ?ALBUMIN 3.3*  ? ?  ?Micro : Blood cultures x2 negative to date ? ? ? ?Antibiotics: ?Anti-infectives (From admission, onward)  ? ? Start     Dose/Rate Route Frequency Ordered Stop  ? 11/14/21 1800  vancomycin (VANCOCIN) IVPB 1000 mg/200 mL premix  Status:  Discontinued       ? 1,000 mg ?200 mL/hr over 60 Minutes Intravenous Every 24 hours 11/13/21 2046 11/14/21 1544  ? 11/13/21 1700  vancomycin (VANCOCIN) IVPB 1000 mg/200 mL premix       ? 1,000 mg ?200 mL/hr over 60 Minutes Intravenous  Once 11/13/21 1647 11/13/21 1922  ? 11/13/21 1700  ceFEPIme (MAXIPIME) 2 g in sodium chloride 0.9 % 100 mL IVPB       ? 2 g ?200 mL/hr over 30 Minutes  Intravenous  Once 11/13/21 1647 11/13/21 1801  ? ?  ? ? ? ? ? ?DVT prophylaxis: Lovenox ? ?Code Status: Full code ? ?Family Communication: No family at bedside ? ?CONSULTS: Oncology ? ? ? ?Objective  ? ? ?Physical Examination: ? ? ?General-appears in no acute distress ?Heart-S1-S2, regular, no murmur auscultated ?Lungs-clear to auscultation bilaterally, no wheezing or crackles auscultated ?Abdomen-soft, nontender, no organomegaly ?Extremities-no edema in the lower extremities ?Neuro-alert, oriented x3, no focal deficit noted ?Skin-minimal Rash noted on both lower extremities, neck and upper chest wall ? ? ?Status is: Inpatient: Rash, fever ? ? ? ?  ? ? ? ? ? ? ? ?Oswald Hillock ?  ?[facility] ?If 7PM-7AM, please contact night-coverage at [email or web address], ?Office  [phone] ? ? ?11/14/2021, 3:52 PM  LOS: 0 days  ? ? ? ? ? ? ? ? ? ? ?  ?

## 2021-11-14 NOTE — Assessment & Plan Note (Signed)
Continue OxyContin every 12 hours and Oxy IR as needed. ?

## 2021-11-14 NOTE — Assessment & Plan Note (Signed)
-   Serum sodium 130 today ?-Check serum and urine osmolality ?

## 2021-11-14 NOTE — Assessment & Plan Note (Addendum)
Small pustule noted at Port-A-Cath site  ?-No erythema around pustule noted; does not appear infected ?

## 2021-11-14 NOTE — Hospital Course (Signed)
Virginia [name] is a 66 y.o. 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 ?

## 2021-11-14 NOTE — Assessment & Plan Note (Signed)
Hold amlodipine for now, could be cause of her lower extremity edema. ?

## 2021-11-14 NOTE — Assessment & Plan Note (Addendum)
Skin rash ?-Patient said she had fever of 101 at home in setting of upper respiratory symptoms ?-She has been afebrile in the hospital ?-Rash seems more like a side effect of medication, she was started on scopolamine patch 2 days before appearance of rash ?-She also had chemotherapy on 11/09/2021 ?-Start prednisone 40 mg daily ?-Start scheduled Benadryl 25 mg every 8 hours ?-No clear source of infection, chest x-ray is clear, urine and blood cultures are negative to date ?-She was empirically started on vancomycin and cefepime ?-We will discontinue antibiotics and monitor her in the hospital ? ?

## 2021-11-14 NOTE — Progress Notes (Signed)
Brief Hematology/Oncology Progress Note ? ?Clinical Summary: [name] is a 66 year old female with history of metastatic adenocarcinoma of the lung who is currently admitted with febrile illness and rash. ? ?Interval History: ?-- Lower extremity Doppler showed no evidence of DVT ?--No clear evidence of infectious etiology on chest x-ray, urine culture, and blood cultures ?--Currently being treated with broad-spectrum antibiotics with cefepime and vancomycin for empiric coverage on concern for sepsis ?--On exam today [name] reports she continues having discomfort and itching in her lower extremities and sites affected by rash ?--She notes she has not had any fevers since she arrived in the hospital ? ?O: ? ?Vitals:  ? 11/14/21 0940 11/14/21 1349  ?BP: 127/70 128/83  ?Pulse: (!) 105 (!) 106  ?Resp: 16 18  ?Temp: 98.3 ?F (36.8 ?C) 98.2 ?F (36.8 ?C)  ?SpO2: 96% 92%  ? ?CMP Latest Ref Rng & Units 11/14/2021 11/13/2021 11/09/2021  ?Glucose 70 - 99 mg/dL 107(H) 112(H) 124(H)  ?BUN 8 - 23 mg/dL 7(L) 7(L) 5(L)  ?Creatinine 0.44 - 1.00 mg/dL 0.69 0.78 0.61  ?Sodium 135 - 145 mmol/L 130(L) 133(L) 138  ?Potassium 3.5 - 5.1 mmol/L 3.6 3.0(L) 3.1(L)  ?Chloride 98 - 111 mmol/L 95(L) 96(L) 104  ?CO2 22 - 32 mmol/L $RemoveB'23 23 29  'HbMPrhpw$ ?Calcium 8.9 - 10.3 mg/dL 7.7(L) 8.3(L) 8.1(L)  ?Total Protein 6.5 - 8.1 g/dL - - 6.5  ?Total Bilirubin 0.3 - 1.2 mg/dL - - 0.6  ?Alkaline Phos 38 - 126 U/L - - 68  ?AST 15 - 41 U/L - - 20  ?ALT 0 - 44 U/L - - 18  ? ?CBC Latest Ref Rng & Units 11/14/2021 11/13/2021 11/09/2021  ?WBC 4.0 - 10.5 K/uL 4.8 7.1 5.8  ?Hemoglobin 12.0 - 15.0 g/dL 10.1(L) 12.2 11.5(L)  ?Hematocrit 36.0 - 46.0 % 28.8(L) 34.0(L) 32.8(L)  ?Platelets 150 - 400 K/uL 224 319 264  ?  ? ? ?GENERAL: well appearing middle-aged Caucasian female in NAD  ?SKIN: Erythematous rash covering the legs, chest, back, and some of the arms.  Legs are warm to the touch.  Blanching erythema. ?EYES: conjunctiva are pink and non-injected, sclera clear ?LUNGS:  clear to auscultation and percussion with normal breathing effort ?HEART: regular rate & rhythm and no murmurs and no lower extremity edema ?Musculoskeletal: no cyanosis of digits and no clubbing  ?PSYCH: alert & oriented x 3, fluent speech ?NEURO: no focal motor/sensory deficits ? ?Assessment/Plan: ? ?[name] is a 66 year old female with history of metastatic adenocarcinoma of the lung who is currently admitted with febrile illness and rash. ? ?# Lower Extremity Rash ?# Febrile Illness ?-- No clear evidence of infectious etiology causing the patient's lower extremity edema and fever ?--Agree with empiric antibiotics per primary team.  Awaiting the results of the blood cultures. ?--Lower extremity ultrasound shows no evidence of DVT ?-- This potentially represents a medication side effect, considerably less likely to be chemotherapy given that she has completed well over 25 cycles without any difficulty ?--The only new medication started recently has been a scopolamine patch on Tuesday.  Rash broke out on Friday.  Not a common side effect but certainly possible ?--Agree with antihistamine therapy/steroid therapy if necessary to help reduce the inflammation ?--Oncology will continue to follow. ? ? 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 Due to worsening dizziness, she  resumed this with Cycle 25.  ?--patient will be followed every 3 weeks with labs and clinic visit ?--repeat CT CAP on 09/18/2021 showed stable disease with no evidence of progression. Next due in April 2023.  ?--RTC as scheduled for next likely chemotherapy. ? ? ?Ledell Peoples, MD ?Department of Hematology/Oncology ?[place] [facility] ?Phone: [phone] ?Pager: [phone] ?Email: [email or web address] ? ?

## 2021-11-14 NOTE — Assessment & Plan Note (Addendum)
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   ?-Follows with oncology ?- Dr. Lorenso [name] following in the hospital ?

## 2021-11-14 NOTE — Assessment & Plan Note (Addendum)
Replete

## 2021-11-15 DIAGNOSIS — R509 Fever, unspecified: Secondary | ICD-10-CM | POA: Diagnosis not present

## 2021-11-15 LAB — CBC
HCT: 26.8 % — ABNORMAL LOW (ref 36.0–46.0)
Hemoglobin: 9.4 g/dL — ABNORMAL LOW (ref 12.0–15.0)
MCH: 35.5 pg — ABNORMAL HIGH (ref 26.0–34.0)
MCHC: 35.1 g/dL (ref 30.0–36.0)
MCV: 101.1 fL — ABNORMAL HIGH (ref 80.0–100.0)
Platelets: 172 10*3/uL (ref 150–400)
RBC: 2.65 MIL/uL — ABNORMAL LOW (ref 3.87–5.11)
RDW: 13.4 % (ref 11.5–15.5)
WBC: 3.2 10*3/uL — ABNORMAL LOW (ref 4.0–10.5)
nRBC: 0 % (ref 0.0–0.2)

## 2021-11-15 LAB — COMPREHENSIVE METABOLIC PANEL
ALT: 26 U/L (ref 0–44)
AST: 32 U/L (ref 15–41)
Albumin: 2.5 g/dL — ABNORMAL LOW (ref 3.5–5.0)
Alkaline Phosphatase: 55 U/L (ref 38–126)
Anion gap: 7 (ref 5–15)
BUN: 7 mg/dL — ABNORMAL LOW (ref 8–23)
CO2: 25 mmol/L (ref 22–32)
Calcium: 8.3 mg/dL — ABNORMAL LOW (ref 8.9–10.3)
Chloride: 103 mmol/L (ref 98–111)
Creatinine, Ser: 0.56 mg/dL (ref 0.44–1.00)
GFR, Estimated: >60 mL/min (ref >60)
Glucose, Bld: 123 mg/dL — ABNORMAL HIGH (ref 70–99)
Potassium: 4.7 mmol/L (ref 3.5–5.1)
Sodium: 135 mmol/L (ref 135–145)
Total Bilirubin: 0.4 mg/dL (ref 0.3–1.2)
Total Protein: 6 g/dL — ABNORMAL LOW (ref 6.5–8.1)

## 2021-11-15 MED ORDER — FAMOTIDINE IN NACL 20-0.9 MG/50ML-% IV SOLN
20.0000 mg | Freq: Three times a day (TID) | INTRAVENOUS | Status: AC
  Administered 2021-11-15 – 2021-11-16 (×3): 20 mg via INTRAVENOUS
  Filled 2021-11-15 (×3): qty 50

## 2021-11-15 MED ORDER — DIPHENHYDRAMINE HCL 50 MG/ML IJ SOLN
12.5000 mg | Freq: Three times a day (TID) | INTRAMUSCULAR | Status: AC
  Administered 2021-11-15 – 2021-11-16 (×3): 12.5 mg via INTRAVENOUS
  Filled 2021-11-15 (×3): qty 1

## 2021-11-15 MED ORDER — ADULT MULTIVITAMIN W/MINERALS CH
1.0000 | ORAL_TABLET | Freq: Every day | ORAL | Status: DC
  Administered 2021-11-15 – 2021-11-18 (×4): 1 via ORAL
  Filled 2021-11-15 (×4): qty 1

## 2021-11-15 MED ORDER — METHYLPREDNISOLONE SODIUM SUCC 40 MG IJ SOLR
40.0000 mg | Freq: Three times a day (TID) | INTRAMUSCULAR | Status: AC
  Administered 2021-11-15 – 2021-11-16 (×3): 40 mg via INTRAVENOUS
  Filled 2021-11-15 (×3): qty 1

## 2021-11-15 MED ORDER — SENNOSIDES-DOCUSATE SODIUM 8.6-50 MG PO TABS
2.0000 | ORAL_TABLET | Freq: Two times a day (BID) | ORAL | Status: DC
  Administered 2021-11-15 – 2021-11-18 (×6): 2 via ORAL
  Filled 2021-11-15 (×6): qty 2

## 2021-11-15 MED ORDER — CAMPHOR-MENTHOL 0.5-0.5 % EX LOTN
TOPICAL_LOTION | CUTANEOUS | Status: DC | PRN
  Filled 2021-11-15: qty 222

## 2021-11-15 MED ORDER — ALBUTEROL SULFATE (2.5 MG/3ML) 0.083% IN NEBU
2.5000 mg | INHALATION_SOLUTION | Freq: Two times a day (BID) | RESPIRATORY_TRACT | Status: DC
  Administered 2021-11-15 – 2021-11-17 (×5): 2.5 mg via RESPIRATORY_TRACT
  Filled 2021-11-15 (×5): qty 3

## 2021-11-15 MED ORDER — METHYLPREDNISOLONE SODIUM SUCC 40 MG IJ SOLR
40.0000 mg | Freq: Two times a day (BID) | INTRAMUSCULAR | Status: DC
  Administered 2021-11-15: 40 mg via INTRAVENOUS
  Filled 2021-11-15: qty 1

## 2021-11-15 MED ORDER — FAMOTIDINE IN NACL 20-0.9 MG/50ML-% IV SOLN
20.0000 mg | Freq: Two times a day (BID) | INTRAVENOUS | Status: DC
  Administered 2021-11-15: 20 mg via INTRAVENOUS
  Filled 2021-11-15: qty 50

## 2021-11-15 MED ORDER — DIPHENHYDRAMINE HCL 50 MG/ML IJ SOLN
12.5000 mg | Freq: Two times a day (BID) | INTRAMUSCULAR | Status: DC
  Administered 2021-11-15: 12.5 mg via INTRAVENOUS
  Filled 2021-11-15: qty 1

## 2021-11-15 MED ORDER — BISACODYL 10 MG RE SUPP: 10.0000 mg | Freq: Every day | RECTAL | Status: DC | PRN

## 2021-11-15 NOTE — Progress Notes (Signed)
PROGRESS NOTE  Virginia [name] JKD:[id number] DOB: Jan 25, 1956 DOA: 11/13/2021 PCP: Orson Slick, MD  HPI/Recap of past 24 hours: Virginia [name] is a 66 y.o. 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  RLE cellulitis, ruled out.   11/15/2021: Patient was seen and examined at her bedside.  She has no tenderness in her lower extremities.  She has a rash involving both legs all the way up to her thighs and bilateral upper extremities.  Started regimen for allergic reaction of unknown etiology.  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 -Patient said she had fever of 101 at home in setting of upper respiratory symptoms Chest x-ray benign. -She has been afebrile in the hospital -She was empirically started on vancomycin and cefepime, DC'd on 11/14/2021  Rash, likely secondary to allergic reaction of unknown etiology -Rash seems more like a side effect of medication, she was started on scopolamine patch 2 days before appearance of rash -She also had chemotherapy on 11/09/2021 Start IV Solu-Medrol 40 mg twice daily, IV famotidine 20 mg twice daily, IV Benadryl 12.5 mg twice daily, all x1 day. 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   -Follows with oncology - Dr. Lorenso Courier following in the hospital   Resolved post repletion: Hypokalemia Serum potassium 4.7  Resolved hypovolemic hyponatremia Serum sodium 135 from 130.   Cancer associated pain Continue OxyContin every 12 hours and Oxy IR as needed.   Port-A-Cath in place Small pustule noted at Port-A-Cath site  -No erythema around pustule noted; does not appear infected Continue to monitor.   Hypertension BP stable Continue to hold amlodipine for now, could be cause of her lower extremity edema.        Code Status: Full code  Family Communication: None at bedside  Disposition Plan: Possible discharge to home on 11/16/2021 pending improvement of her rash.   Consultants: None.  Procedures: None.  Antimicrobials: None.  DVT prophylaxis: SQ Lovenox daily.  Status is: Inpatient Patient requires at least 2 midnights for further evaluation and treatment of present condition.    Objective: Vitals:   11/14/21 1349 11/14/21 2036 11/15/21 0519 11/15/21 0754  BP: 128/83 128/85 116/77   Pulse: (!) 106 (!) 101 96   Resp: 18 18 18    Temp: 98.2 F (36.8 C) 98.7 F (37.1 C) 98.2 F (36.8 C)   TempSrc: Oral Oral Oral   SpO2: 92% 94% 98% 96%  Weight:      Height:        Intake/Output Summary (Last 24 hours) at 11/15/2021 1244 Last data filed at 11/15/2021 0951 Gross per 24 hour  Intake 240 ml  Output --  Net 240 ml   Filed Weights   11/13/21 1632 11/13/21 2225  Weight: 78.6 kg 78.2 kg    Exam:  General: 66 y.o. year-old female well developed well nourished in no acute distress.  Alert and oriented x3. Cardiovascular: Regular rate and rhythm with no rubs or gallops.  No thyromegaly or JVD noted.   Respiratory: Clear to auscultation with no wheezes or rales. Good inspiratory effort. Abdomen: Soft nontender nondistended with normal bowel sounds x4 quadrants. Musculoskeletal: No lower extremity edema.  2/4 pulses in all 4 extremities. Skin: Diffuse rash blanchable affecting lower extremities bilaterally and upper extremities bilaterally. Psychiatry: Mood is appropriate for condition and setting Neuro: Moves all 4 extremities equally.   Data Reviewed: CBC: Recent Labs  Lab 11/09/21 1029 11/13/21 1550 11/14/21 0616 11/15/21 0601  WBC 5.8 7.1 4.8 3.2*  NEUTROABS 3.1 6.3  --   --   HGB 11.5* 12.2 10.1* 9.4*  HCT 32.8* 34.0* 28.8* 26.8*  MCV 100.3* 99.1 100.7* 101.1*  PLT 264 319 224 563   Basic Metabolic Panel: Recent Labs  Lab 11/09/21 1029 11/13/21 1550 11/14/21 0616 11/15/21 0601  NA 138 133* 130* 135  K 3.1* 3.0* 3.6 4.7  CL 104 96* 95* 103  CO2 29 23 23 25   GLUCOSE 124* 112* 107* 123*  BUN 5* 7* 7* 7*  CREATININE 0.61 0.78 0.69 0.56  CALCIUM 8.1* 8.3* 7.7* 8.3*  MG  --  1.6* 2.2  --    GFR: Estimated Creatinine Clearance: 68.7 mL/min (by C-G formula based on SCr of 0.56 mg/dL). Liver Function Tests: Recent Labs  Lab 11/09/21 1029 11/15/21 0601  AST 20 32  ALT 18 26  ALKPHOS 68 55  BILITOT 0.6 0.4  PROT 6.5 6.0*  ALBUMIN 3.3* 2.5*   No results for input(s): LIPASE, AMYLASE in the last 168 hours. No results for input(s): AMMONIA in the last 168 hours. Coagulation Profile: No results for input(s): INR, PROTIME in the last 168 hours. Cardiac Enzymes: No results for input(s): CKTOTAL, CKMB, CKMBINDEX, TROPONINI in the last 168 hours. BNP (last 3 results) No results for input(s): PROBNP in the last 8760 hours. HbA1C: No results for input(s): HGBA1C in the last 72 hours. CBG: No results for input(s): GLUCAP in the last 168 hours. Lipid Profile: No results for input(s): CHOL, HDL, LDLCALC, TRIG, CHOLHDL, LDLDIRECT in the last 72 hours. Thyroid Function Tests: No results for input(s): TSH, T4TOTAL, FREET4, T3FREE, THYROIDAB in the last 72 hours. Anemia Panel: No results for input(s): VITAMINB12, FOLATE, FERRITIN, TIBC, IRON, RETICCTPCT in the last 72  hours. Urine analysis:    Component Value Date/Time   COLORURINE STRAW (A) 09/28/2021 1032   APPEARANCEUR CLEAR 09/28/2021 1032   LABSPEC 1.006 09/28/2021 1032   PHURINE 7.0 09/28/2021 1032   GLUCOSEU NEGATIVE 09/28/2021 1032   HGBUR NEGATIVE 09/28/2021 [place] 09/28/2021 1032   BILIRUBINUR NEG 06/12/2019 1639   KETONESUR NEGATIVE 09/28/2021 1032   PROTEINUR NEGATIVE 10/20/2021 1115   UROBILINOGEN 0.2 06/12/2019 1639   UROBILINOGEN 0.2 04/01/2014 1006   NITRITE NEGATIVE 09/28/2021 1032   LEUKOCYTESUR NEGATIVE 09/28/2021 1032   Sepsis Labs: @LABRCNTIP (procalcitonin:4,lacticidven:4)  ) Recent Results (from the past 240 hour(s))  Resp Panel by RT-PCR (Flu A&B, Covid) Nasopharyngeal Swab     Status: None   Collection Time: 11/13/21  3:23 PM   Specimen: Nasopharyngeal Swab; Nasopharyngeal(NP) swabs in vial transport medium  Result Value Ref Range Status   SARS Coronavirus 2 by RT PCR NEGATIVE NEGATIVE Final    Comment: (NOTE) SARS-CoV-2 target nucleic acids are NOT DETECTED.  The SARS-CoV-2 RNA is generally detectable in upper respiratory specimens during the acute phase of infection. The lowest concentration of SARS-CoV-2 viral copies this assay can detect is 138 copies/mL. A negative result does not preclude SARS-Cov-2 infection and should not be used as the sole basis for treatment or other patient management decisions. A negative result may occur with  improper specimen collection/handling, submission of specimen other than nasopharyngeal swab, presence of viral mutation(s) within the areas targeted by this assay, and inadequate number of viral copies(<138 copies/mL). A negative result must be combined with clinical observations, patient history, and epidemiological information. The expected result is Negative.  Fact Sheet for Patients:  EntrepreneurPulse.com.au  Fact Sheet for Healthcare Providers:   IncredibleEmployment.be  This test is no t yet approved or cleared by the [place] FDA and  has been authorized for detection and/or diagnosis of SARS-CoV-2 by FDA under an Emergency Use Authorization (EUA). This EUA will remain  in effect (meaning this test can be used) for the duration of the COVID-19 declaration under Section 564(b)(1) of the Act, 21 U.S.C.section 360bbb-3(b)(1), unless the authorization is terminated  or revoked sooner.       Influenza A by PCR NEGATIVE NEGATIVE Final   Influenza B by PCR NEGATIVE NEGATIVE Final    Comment: (NOTE) The Xpert Xpress SARS-CoV-2/FLU/RSV plus assay is intended as an aid in the diagnosis of influenza from Nasopharyngeal swab specimens and should not be used as a sole basis for treatment. Nasal washings and aspirates are unacceptable for Xpert Xpress SARS-CoV-2/FLU/RSV testing.  Fact Sheet for Patients: EntrepreneurPulse.com.au  Fact Sheet for Healthcare Providers: IncredibleEmployment.be  This test is not yet approved or cleared by the [place] FDA and has been authorized for detection and/or diagnosis of SARS-CoV-2 by FDA under an Emergency Use Authorization (EUA). This EUA will remain in effect (meaning this test can be used) for the duration of the COVID-19 declaration under Section 564(b)(1) of the Act, 21 U.S.C. section 360bbb-3(b)(1), unless the authorization is terminated or revoked.  Performed at [facility], [place] [address] [place], [place] [address]   Blood culture (routine x 2)     Status: None (Preliminary result)   Collection Time: 11/13/21  3:45 PM   Specimen: BLOOD  Result Value Ref Range Status   Specimen Description   Final    BLOOD RIGHT ANTECUBITAL Performed at [facility] [address], [place], [place] [address]    Special Requests   Final    BOTTLES DRAWN AEROBIC AND ANAEROBIC Blood Culture  adequate volume Performed at [facility] [address] [place], [place] [address]    Culture   Final    NO GROWTH 2 DAYS Performed at [facility] [address], [place], [place] [address]    Report Status PENDING  Incomplete  Blood culture (routine x 2)     Status: None (Preliminary result)   Collection Time: 11/13/21 10:56 PM   Specimen: BLOOD LEFT HAND  Result Value Ref Range Status   Specimen Description   Final    BLOOD LEFT HAND Performed at [facility] [address], [place], [place] [address]    Special Requests   Final    BOTTLES DRAWN AEROBIC ONLY Blood Culture adequate volume Performed at [facility] [address] [place], [place] [address]    Culture   Final    NO GROWTH 1 DAY Performed at [facility], [address] [place], [place] [address]    Report Status PENDING  Incomplete      Studies: No results found.  Scheduled Meds:  albuterol  2.5 mg Nebulization BID   diphenhydrAMINE  25 mg Oral Q8H   enoxaparin (LOVENOX) injection  40 mg Subcutaneous Q24H   feeding supplement  237 mL Oral BID BM   folic acid  1 mg Oral Daily   OLANZapine  2.5 mg Oral QHS   oxyCODONE  15 mg Oral Q12H   potassium chloride  20 mEq Oral BID   predniSONE  40 mg Oral Q breakfast    Continuous Infusions:   LOS: 1 day     Kayleen Memos, MD [facility] Pager [phone]  If 7PM-7AM, please contact night-coverage [email or web address] Password [facility] 11/15/2021, 12:44 PM

## 2021-11-15 NOTE — Progress Notes (Signed)
Mobility Specialist - Progress Note ? ? ? 11/15/21 1201  ?Mobility  ?Activity Ambulated independently in hallway;Ambulated independently in room  ?Level of Assistance Modified independent, requires aide device or extra time  ?Assistive Device Four wheel walker  ?Distance Ambulated (ft) 85 ft  ?Activity Response Tolerated well  ?$Mobility charge 1 Mobility  ? ?Pt agreeable to mobilize upon entry, stated she only tolerates short distances due to edema on LE. Pt used 4WW to ambulate 85 ft in hallway. 1 standing rest break required as pt experienced SOB. At EOS pt returned to room and was left in bed with legs elevated and family in room. Call bell at side.  ? ?Shaivi Rothschild S. ?Mobility Specialist ?Acute Rehabilitation Services ?Phone: [phone] ?11/15/21, 12:16 PM ? ? ? ? ?  ? ?

## 2021-11-15 NOTE — Progress Notes (Signed)
Initial Nutrition Assessment ? ?INTERVENTION:  ? ?-Ensure Plus High Protein po BID, each supplement provides 350 kcal and 20 grams of protein.  ? ?-Multivitamin with minerals daily ? ?NUTRITION DIAGNOSIS:  ? ?Increased nutrient needs related to cancer and cancer related treatments as evidenced by estimated needs. ? ?GOAL:  ? ?Patient will meet greater than or equal to 90% of their needs ? ?MONITOR:  ? ?PO intake, Labs, Weight trends, I & O's ? ?REASON FOR ASSESSMENT:  ? ?Malnutrition Screening Tool ?  ? ?ASSESSMENT:  ? ?66 y.o. 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 ? ?Patient in room, husband at bedside. Pt getting bed. States she has been dealing with swelling since before her last chemotherapy treatment (3/2). Pt states the chemo has caused some taste changes, her favorite foods don't taste right anymore. Pt uses mouth rinses, she has still developed some mouth sores. She is not a fan of the hospital food but her husband is bringing in foods for her and snacks such as fruits.  ?Consuming 70-75% of meals.  ?Pt reports taking vitamin supplements at home such as Vitamin D, B-12, folate, MVI, Vitamin E. ?Does not typically drink protein shakes. Ensure has been ordered. ? ?Pt reports her weight has increased by 10 lbs d/t fluid accumulation.  ?Current weight: 172 lbs. Weight from 2/21 was 162 lbs. ? ?Medications: Folic acid, KLOR-CON, Miralax ?  ?Labs reviewed. ? ?NUTRITION - FOCUSED PHYSICAL EXAM: ? ?Flowsheet Row Most Recent Value  ?Orbital Region No depletion  ?Upper Arm Region No depletion  ?Thoracic and Lumbar Region No depletion  ?Buccal Region No depletion  ?Temple Region No depletion  ?Clavicle Bone  Region No depletion  ?Clavicle and Acromion Bone Region No depletion  ?Scapular Bone Region No depletion  ?Dorsal Hand No depletion  ?Patellar Region Unable to assess  Lehman Prom, pt reports pain]  ?Anterior Thigh Region Unable to assess  ?Posterior Calf Region Unable to assess  ?Edema (RD Assessment) Moderate  [BLE]  ?Hair Reviewed  ?Eyes Reviewed  ?Mouth Reviewed  ?Skin Reviewed  Lehman Prom on BLE and BUEs]  ? ?  ? ? ?Diet Order:   ?Diet Order   ? ?       ?  Diet regular Room service appropriate? Yes; Fluid consistency: Thin  Diet effective now       ?  ? ?  ?  ? ?  ? ? ?EDUCATION NEEDS:  ? ?No education needs have been identified at this time ? ?Skin:  Skin Assessment: Reviewed RN Assessment ? ?Last BM:  3/5 ? ?Height:  ? ?Ht Readings from Last 1 Encounters:  ?11/13/21 5\' 2"  (1.575 m)  ? ? ?Weight:  ? ?Wt Readings from Last 1 Encounters:  ?11/13/21 78.2 kg  ? ? ?BMI:  Body mass index is 31.53 kg/m?. ? ?Estimated Nutritional Needs:  ? ?Kcal:  1550-1750 ? ?Protein:  75-90g ? ?Fluid:  1.8L/day ? ?Clayton Bibles, MS, RD, LDN ?Inpatient Clinical Dietitian ?Contact information available via Amion ? ?

## 2021-11-16 ENCOUNTER — Other Ambulatory Visit: Payer: Self-pay | Admitting: Hematology and Oncology

## 2021-11-16 ENCOUNTER — Other Ambulatory Visit (HOSPITAL_COMMUNITY): Payer: Self-pay

## 2021-11-16 DIAGNOSIS — R509 Fever, unspecified: Secondary | ICD-10-CM | POA: Diagnosis not present

## 2021-11-16 LAB — CBC
HCT: 25.3 % — ABNORMAL LOW (ref 36.0–46.0)
Hemoglobin: 8.9 g/dL — ABNORMAL LOW (ref 12.0–15.0)
MCH: 35.3 pg — ABNORMAL HIGH (ref 26.0–34.0)
MCHC: 35.2 g/dL (ref 30.0–36.0)
MCV: 100.4 fL — ABNORMAL HIGH (ref 80.0–100.0)
Platelets: 156 10*3/uL (ref 150–400)
RBC: 2.52 MIL/uL — ABNORMAL LOW (ref 3.87–5.11)
RDW: 13.3 % (ref 11.5–15.5)
WBC: 3.9 10*3/uL — ABNORMAL LOW (ref 4.0–10.5)
nRBC: 0.5 % — ABNORMAL HIGH (ref 0.0–0.2)

## 2021-11-16 LAB — MRSA NEXT GEN BY PCR, NASAL: MRSA by PCR Next Gen: NOT DETECTED

## 2021-11-16 LAB — OSMOLALITY, URINE: Osmolality, Ur: 189 mOsm/kg — ABNORMAL LOW (ref 300–900)

## 2021-11-16 MED ORDER — FAMOTIDINE IN NACL 20-0.9 MG/50ML-% IV SOLN
20.0000 mg | Freq: Three times a day (TID) | INTRAVENOUS | Status: AC
  Administered 2021-11-16 – 2021-11-17 (×3): 20 mg via INTRAVENOUS
  Filled 2021-11-16 (×3): qty 50

## 2021-11-16 MED ORDER — VANCOMYCIN HCL 1750 MG/350ML IV SOLN
1750.0000 mg | Freq: Once | INTRAVENOUS | Status: AC
  Administered 2021-11-16: 19:00:00 1750 mg via INTRAVENOUS
  Filled 2021-11-16: qty 350

## 2021-11-16 MED ORDER — DIPHENHYDRAMINE HCL 50 MG/ML IJ SOLN
12.5000 mg | Freq: Three times a day (TID) | INTRAMUSCULAR | Status: DC
  Administered 2021-11-16 – 2021-11-17 (×2): 12.5 mg via INTRAVENOUS
  Filled 2021-11-16 (×3): qty 1

## 2021-11-16 MED ORDER — PREDNISONE 10 MG PO TABS
10.0000 mg | ORAL_TABLET | Freq: Every day | ORAL | 0 refills | Status: DC
  Filled 2021-11-16: qty 30, 10d supply, fill #0

## 2021-11-16 MED ORDER — EPINEPHRINE 0.3 MG/0.3ML IJ SOAJ
0.3000 mg | INTRAMUSCULAR | 0 refills | Status: DC | PRN
  Filled 2021-11-16: qty 2, 30d supply, fill #0

## 2021-11-16 MED ORDER — FEXOFENADINE HCL 180 MG PO TABS
180.0000 mg | ORAL_TABLET | Freq: Every day | ORAL | 0 refills | Status: DC
Start: 2021-11-16 — End: 2021-11-17
  Filled 2021-11-16: qty 10, 10d supply, fill #0

## 2021-11-16 MED ORDER — DIPHENHYDRAMINE HCL 50 MG/ML IJ SOLN: 12.5000 mg | Freq: Three times a day (TID) | INTRAMUSCULAR | Status: DC

## 2021-11-16 MED ORDER — SODIUM CHLORIDE 0.9 % IV SOLN
2.0000 g | Freq: Three times a day (TID) | INTRAVENOUS | Status: DC
  Administered 2021-11-16 – 2021-11-17 (×2): 2 g via INTRAVENOUS
  Filled 2021-11-16 (×3): qty 2

## 2021-11-16 MED ORDER — VANCOMYCIN HCL IN DEXTROSE 1-5 GM/200ML-% IV SOLN: 1000.0000 mg | INTRAVENOUS | Status: DC

## 2021-11-16 MED ORDER — GUAIFENESIN-DM 100-10 MG/5ML PO SYRP
5.0000 mL | ORAL_SOLUTION | ORAL | Status: DC | PRN
  Administered 2021-11-16: 05:00:00 5 mL via ORAL
  Filled 2021-11-16 (×2): qty 10

## 2021-11-16 MED ORDER — XTAMPZA ER 13.5 MG PO C12A
1.0000 | EXTENDED_RELEASE_CAPSULE | Freq: Two times a day (BID) | ORAL | 0 refills | Status: DC
  Filled 2021-11-16 – 2021-11-20 (×2): qty 60, 30d supply, fill #0

## 2021-11-16 MED ORDER — DIPHENHYDRAMINE HCL 50 MG PO TABS
50.0000 mg | ORAL_TABLET | Freq: Every day | ORAL | 0 refills | Status: DC
  Filled 2021-11-16: qty 10, 10d supply, fill #0

## 2021-11-16 MED ORDER — METHYLPREDNISOLONE SODIUM SUCC 40 MG IJ SOLR
40.0000 mg | Freq: Three times a day (TID) | INTRAMUSCULAR | Status: DC
  Administered 2021-11-16 – 2021-11-17 (×2): 40 mg via INTRAVENOUS
  Filled 2021-11-16 (×2): qty 1

## 2021-11-16 MED ORDER — FAMOTIDINE IN NACL 20-0.9 MG/50ML-% IV SOLN: 20.0000 mg | Freq: Three times a day (TID) | INTRAVENOUS | Status: DC

## 2021-11-16 NOTE — TOC Transition Note (Signed)
Transition of Care (TOC) - CM/SW Discharge Note ? ? ?Patient Details  ?Name: Virginia [name] ?MRN: [id number] ?Date of Birth: 1956-03-08 ? ?Transition of Care (TOC) CM/SW Contact:  ?Sammuel Blick, Marjie Skiff, RN ?Phone Number: ?11/16/2021, 12:59 PM ? ? ?Clinical Narrative:    ? ?Spoke with pt at bedside for dc planning. Pt politely declines outpatient PT at this time. She has a 4 wheeled rolator at bedside that she uses at home. ? ?  ?

## 2021-11-16 NOTE — Progress Notes (Signed)
Pharmacy Antibiotic Note ? ?Virginia [name] is a 66 y.o. female admitted on 11/13/2021 with fever and rash. 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 No clear source of infection, concern for possible port infection. Pharmacy has been consulted for vancomycin and cefepime dosing. ? ?Plan: ?Vancomycin 1750 mg IV x 1 followed by 1000 mg IV q24h ?Goal AUC 400-550 ?Expected AUC: 501 ?SCr used: 0.8 ? ?Cefepime 2 g IV q8h ? ?Continue to follow labs, cultures and clinical progress for antibiotic adjustments or de-escalation ? ? ?Height: 5\' 2"  (157.5 cm) ?Weight: 78.2 kg (172 lb 6.4 oz) ?IBW/kg (Calculated) : 50.1 ? ?Temp (24hrs), Avg:97.6 ?F (36.4 ?C), Min:97.5 ?F (36.4 ?C), Max:97.7 ?F (36.5 ?C) ? ?Recent Labs  ?Lab 11/13/21 ?1550 11/13/21 ?1558 11/13/21 ?1720 11/14/21 ?8592 11/15/21 ?0601 11/16/21 ?9244  ?WBC 7.1  --   --  4.8 3.2* 3.9*  ?CREATININE 0.78  --   --  0.69 0.56  --   ?LATICACIDVEN  --  1.1 1.0  --   --   --   ?  ?Estimated Creatinine Clearance: 68.7 mL/min (by C-G formula based on SCr of 0.56 mg/dL).   ? ?No Known Allergies ? ?Antimicrobials this admission: ?Vancomycin 3/6 x 1, 3/9 >> ?Cefepime 3/6 x 1, 3/9 >> ? ?Dose adjustments this admission: NA ? ?Microbiology results: ?3/6 BCx: ngtd ?3/9 MRSA PCR: ordered ? ?Thank you for allowing pharmacy to be a part of this patient?s care. ? ?Tawnya Crook, PharmD, BCPS ?Clinical Pharmacist ?11/16/2021 5:13 PM ? ? ?

## 2021-11-16 NOTE — Progress Notes (Signed)
Received call from telemetry that pt HR is in 140s. ?This RN assessed pt who was assisted to the bathroom via her rolling walker and husband at bedside. ?Pt denies chest pain/discomfort. ?No signs of distress. ? ?Will continue to monitor. ?

## 2021-11-16 NOTE — Progress Notes (Signed)
Awaiting IR eval on pts chest port prior to discharge. ?

## 2021-11-16 NOTE — Evaluation (Signed)
Physical Therapy Evaluation ?Patient Details ?Name: Virginia [name] ?MRN: [id number] ?DOB: Apr 28, 1956 ?Today's Date: 11/16/2021 ? ?History of Present Illness ? Patient is a 66 year old female admitted with febrile illness associated with rash. 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  ?Clinical Impression ? Pt presents with decreased functional endurance, decreased coordination, and balance deficits. These impairments are limiting her ability to safely and independently transfer, get into her home, perform all adls/iadls, and ambulate in the community. Pt to benefit from acute PT to address deficits. STS transfer, standing high marches were performed and pt ambulated 150 feet with rollator and ataxic gait. She did require one seated rest break during ambulation secondary to fatigue.  Pt. Responded well but was limited secondary to fatigue, coordination, and balance. SPO2 was stable during ambulation but dropped to 84% afterwards and quickly recovered with seated rest. SPT recommends outpatient PT follow up to continue progressing balance, coordination, and functional endurance once medically stale for d/c. PT to progress mobility as tolerated, and will continue to follow acutely.  ?   ?   ? ?Recommendations for follow up therapy are one component of a multi-disciplinary discharge planning process, led by the attending physician.  Recommendations may be updated based on patient status, additional functional criteria and insurance authorization. ? ?Follow Up Recommendations Outpatient PT ? ?  ?Assistance Recommended at Discharge Set up Supervision/Assistance  ?Patient can return home with the following ? A little help with walking and/or transfers;A little help with bathing/dressing/bathroom;Assistance with cooking/housework;Direct supervision/assist for financial management;Help with stairs or ramp for  entrance;Direct supervision/assist for medications management ? ?  ?Equipment Recommendations None recommended by PT  ?Recommendations for Other Services ?    ?  ?Functional Status Assessment Patient has had a recent decline in their functional status and demonstrates the ability to make significant improvements in function in a reasonable and predictable amount of time.  ? ?  ?Precautions / Restrictions Precautions ?Precautions: None ?Precaution Comments: Uses Rollator at baseline ?Restrictions ?Weight Bearing Restrictions: No  ? ?  ? ?Mobility ? Bed Mobility ?  ?  ?  ?  ?  ?  ?  ?General bed mobility comments: Seated in chair upon arrival ?Patient Response: Anxious ? ?Transfers ?Overall transfer level: Needs assistance ?Equipment used: Rollator (4 wheels) ?Transfers: Sit to/from Stand ?Sit to Stand: Min guard ?  ?  ?  ?  ?  ?General transfer comment: Min guard for satey and rollator managemet ?  ? ?Ambulation/Gait ?Ambulation/Gait assistance: Min guard ?Gait Distance (Feet): 150 Feet ?Assistive device: Rollator (4 wheels) ?Gait Pattern/deviations: Trunk flexed, Step-through pattern ?Gait velocity: WFL but seems not fully controlled ?  ?Pre-gait activities: Standing high marches with locked rollator to simulate steps x10 ?General Gait Details: Pt. walks quickly but does not seem in control. Lets rollator far infront during gait and requires one seated rest break ~2 min during ambulation. O2 99% HR 115 during rest break, O2 84% HR 128 upon seated rest after ambulation ? ?Stairs ?  ?  ?  ?  ?  ? ?Wheelchair Mobility ?  ? ?Modified Rankin (Stroke Patients Only) ?  ? ?  ? ?Balance Overall balance assessment: Needs assistance ?Sitting-balance support: No upper extremity supported ?Sitting balance-Leahy Scale: Fair ?  ?  ?Standing balance support: Reliant on assistive device for balance ?Standing balance-Leahy Scale: Poor ?Standing balance comment: Reliant on rollator for balance while standing and  during gait ?  ?  ?  ?   ?  ?  ?  ?  ?  ?  ?  ?   ? ? ? ?Pertinent Vitals/Pain Pain Assessment ?Pain Assessment: 0-10 ?Pain Score: 8  ?Pain Location: mid lower back ?Pain Descriptors / Indicators: Sore ?Pain Intervention(s): Limited activity within patient's tolerance, Premedicated before session  ? ? ?Home Living Family/patient expects to be discharged to:: Private residence ?Living Arrangements: Spouse/significant other ?Available Help at Discharge: Family;Available 24 hours/day ?Type of Home: House ?Home Access: Stairs to enter ?Entrance Stairs-Rails: Left ?Entrance Stairs-Number of Steps: 5 ?Alternate Level Stairs-Number of Steps: 18 ?Home Layout: Two level;Bed/bath upstairs ?Home Equipment: Rollator (4 wheels);Wheelchair - manual ?Additional Comments: Some info not consistent from eval 3/22  ?  ?Prior Function Prior Level of Function : Needs assist ?  ?  ?  ?Physical Assist : Mobility (physical);ADLs (physical) ?Mobility (physical): Gait;Stairs ?ADLs (physical): Bathing ?Mobility Comments: Uses rollator for ambulation ?ADLs Comments: Spouse assists with transfer in/out of tub shower, will hold onto patient when going up/down stairs. Spouse does medication management, meal prep. Patient reports she can bath, dress, toilet herself ?  ? ? ?Hand Dominance  ? Dominant Hand: Right ? ?  ?Extremity/Trunk Assessment  ? Upper Extremity Assessment ?Upper Extremity Assessment: Defer to OT evaluation ?RUE Deficits / Details: Grossly within functional limits ?LUE Coordination: decreased gross motor;decreased fine motor ?  ? ?Lower Extremity Assessment ?Lower Extremity Assessment: Generalized weakness ?  ? ?Cervical / Trunk Assessment ?Cervical / Trunk Assessment: Normal  ?Communication  ? Communication: No difficulties  ?Cognition Arousal/Alertness: Awake/alert ?Behavior During Therapy: [facility] for tasks assessed/performed ?Overall Cognitive Status: Within Functional Limits for tasks assessed ?  ?  ?  ?  ?  ?  ?  ?  ?  ?  ?  ?  ?  ?  ?  ?  ?General  Comments: A/O x3 ?  ?  ? ?  ?General Comments   ? ?  ?Exercises    ? ?Assessment/Plan  ?  ?PT Assessment Patient needs continued PT services  ?PT Problem List Decreased strength;Decreased mobility;Decreased range of motion;Decreased coordination;Decreased activity tolerance;Decreased cognition;Decreased knowledge of precautions;Decreased balance;Decreased knowledge of use of DME;Decreased safety awareness ? ?   ?  ?PT Treatment Interventions DME instruction;Therapeutic exercise;Gait training;Balance training;Stair training;Neuromuscular re-education;Functional mobility training;Cognitive remediation;Therapeutic activities;Patient/family education   ? ?PT Goals (Current goals can be found in the Care Plan section)  ?Acute Rehab PT Goals ?Patient Stated Goal: Improve activity level, balance, and coordination ?PT Goal Formulation: With patient ?Time For Goal Achievement: 11/17/21 ?Potential to Achieve Goals: Good ? ?  ?Frequency Min 3X/week ?  ? ? ?Co-evaluation   ?  ?  ?  ?  ? ? ?  ?AM-PAC PT "6 Clicks" Mobility  ?Outcome Measure Help needed turning from your back to your side while in a flat bed without using bedrails?: A Little ?Help needed moving from lying on your back to sitting on the side of a flat bed without using bedrails?: A Little ?Help needed moving to and from a bed to a chair (including a wheelchair)?: A Little ?Help needed standing up from a chair using your arms (e.g., wheelchair or bedside chair)?: A Little ?Help needed to walk in hospital room?: A Little ?Help needed climbing 3-5 steps with a railing? : A Little ?6 Click Score: 18 ? ?  ?End of Session   ?Activity Tolerance: Patient tolerated treatment well;Patient limited by fatigue ?Patient left: in chair;with  call bell/phone within reach;Other (comment) (Respiratory Present) ?Nurse Communication: Mobility status ?PT Visit Diagnosis: Unsteadiness on feet (R26.81);Other abnormalities of gait and mobility (R26.89);Ataxic gait (R26.0) ?  ? ?Time:  [id number] ?PT Time Calculation (min) (ACUTE ONLY): 13 min ? ? ?Charges:   PT Evaluation ?$PT Eval Low Complexity: 1 Low ?  ?  ?   ? ? ?Thermon Leyland, SPT ?Acute Rehab Services ? ? ?Thermon Leyland ?11/16/2021, 11:34 AM ? ?

## 2021-11-16 NOTE — Progress Notes (Addendum)
PROGRESS NOTE  Virginia [name] DQQ:[id number] DOB: [birth year] DOA: 11/13/2021 PCP: Orson Slick, MD  HPI/Recap of past 24 hours: Virginia [name] is a 66 y.o. 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  RLE cellulitis, ruled out.  On 11/16/2021 patient reports painful port site, IR consulted, evaluated patient at bedside.  Due to concern for possible infection, planning for port removal by IR on 11/17/2021.  N.p.o. after midnight.  We will consult infectious disease in the morning.  11/16/2021: Seen and examined at bedside.  States her rash is improving.  Later complained of port discomfort.  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 -She has been afebrile in the hospital -She was empirically started on vancomycin and cefepime, DC'd on 11/14/2021. Due to concern for possible port access infection we will obtain MRSA screening test and add IV vancomycin and cefepime on 11/16/2021. Continue to follow blood cultures taken on 11/13/2021, negative to date.  Possible Port-A-Cath infection, POA IR consulted, evaluated patient at bedside.  Due to concern for possible infection, planning for port removal by IR on 11/17/2021.  N.p.o. after midnight.  We will consult infectious disease in the morning.  Follow cultures from tip of catheter.  Rash,  likely secondary to allergic reaction of unknown etiology -Rash seems more like a side effect of medication, she was started on scopolamine patch 2 days before appearance of rash -She also had chemotherapy on 11/09/2021 Start IV Solu-Medrol 40 mg twice daily, IV famotidine 20 mg twice daily, IV Benadryl 12.5 mg twice daily, all x1 day. 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   -Follows with oncology - Dr. Lorenso Courier following in the hospital   Resolved post repletion: Hypokalemia Serum potassium 4.7  Resolved hypovolemic hyponatremia Serum sodium 135 from 130.   Cancer associated pain Continue OxyContin every 12 hours and Oxy IR as needed.   Hypertension BP stable Continue to hold amlodipine for now, could be cause of her lower extremity edema.        Code Status: Full code  Family Communication: None at bedside  Disposition Plan: Possible discharge to home on 11/17/2021 or when IR signs off.  Consultants: IR  Procedures: None.  Antimicrobials: None.  DVT prophylaxis: SQ Lovenox daily.  Status is: Inpatient Patient requires at least 2 midnights for further evaluation and treatment of present condition.    Objective: Vitals:   11/15/21 1941 11/16/21 0425 11/16/21 0759 11/16/21 1439  BP:  (!) 129/97  140/88  Pulse:  (!) 101  88  Resp:  18 20 17   Temp:  Marland Kitchen)  97.5 F (36.4 C)  97.7 F (36.5 C)  TempSrc:  Oral  Oral  SpO2: 98% 95% 96% 94%  Weight:      Height:        Intake/Output Summary (Last 24 hours) at 11/16/2021 1646 Last data filed at 11/16/2021 0805 Gross per 24 hour  Intake 530 ml  Output --  Net 530 ml   Filed Weights    11/13/21 1632 11/13/21 2225  Weight: 78.6 kg 78.2 kg    Exam:  General: 66 y.o. year-old female well-developed well-nourished in no acute distress.  She is alert and oriented x3.   Cardiovascular: Regular rate and rhythm no rubs or gallops.   Respiratory: Clear auscultation no wheezes or rales.   Abdomen: Soft nontender bowel sounds present. Musculoskeletal: Trace lower extremity edema bilaterally.   Skin: Diffuse blanchable rash improving.   Psychiatry: Mood is appropriate for condition. Neuro: Moves all 4 extremities.   Data Reviewed: CBC: Recent Labs  Lab 11/13/21 1550 11/14/21 0616 11/15/21 0601 11/16/21 0605  WBC 7.1 4.8 3.2* 3.9*  NEUTROABS 6.3  --   --   --   HGB 12.2 10.1* 9.4* 8.9*  HCT 34.0* 28.8* 26.8* 25.3*  MCV 99.1 100.7* 101.1* 100.4*  PLT 319 224 172 195   Basic Metabolic Panel: Recent Labs  Lab 11/13/21 1550 11/14/21 0616 11/15/21 0601  NA 133* 130* 135  K 3.0* 3.6 4.7  CL 96* 95* 103  CO2 23 23 25   GLUCOSE 112* 107* 123*  BUN 7* 7* 7*  CREATININE 0.78 0.69 0.56  CALCIUM 8.3* 7.7* 8.3*  MG 1.6* 2.2  --    GFR: Estimated Creatinine Clearance: 68.7 mL/min (by C-G formula based on SCr of 0.56 mg/dL). Liver Function Tests: Recent Labs  Lab 11/15/21 0601  AST 32  ALT 26  ALKPHOS 55  BILITOT 0.4  PROT 6.0*  ALBUMIN 2.5*   No results for input(s): LIPASE, AMYLASE in the last 168 hours. No results for input(s): AMMONIA in the last 168 hours. Coagulation Profile: No results for input(s): INR, PROTIME in the last 168 hours. Cardiac Enzymes: No results for input(s): CKTOTAL, CKMB, CKMBINDEX, TROPONINI in the last 168 hours. BNP (last 3 results) No results for input(s): PROBNP in the last 8760 hours. HbA1C: No results for input(s): HGBA1C in the last 72 hours. CBG: No results for input(s): GLUCAP in the last 168 hours. Lipid Profile: No results for input(s): CHOL, HDL, LDLCALC, TRIG, CHOLHDL, LDLDIRECT in the last 72 hours. Thyroid  Function Tests: No results for input(s): TSH, T4TOTAL, FREET4, T3FREE, THYROIDAB in the last 72 hours. Anemia Panel: No results for input(s): VITAMINB12, FOLATE, FERRITIN, TIBC, IRON, RETICCTPCT in the last 72 hours. Urine analysis:    Component Value Date/Time   COLORURINE STRAW (A) 09/28/2021 1032   APPEARANCEUR CLEAR 09/28/2021 1032   LABSPEC 1.006 09/28/2021 1032   PHURINE 7.0 09/28/2021 1032   GLUCOSEU NEGATIVE 09/28/2021 1032   HGBUR NEGATIVE 09/28/2021 [place] 09/28/2021 1032   BILIRUBINUR NEG 06/12/2019 1639   KETONESUR NEGATIVE 09/28/2021 1032   PROTEINUR NEGATIVE 10/20/2021 1115   UROBILINOGEN 0.2 06/12/2019 1639   UROBILINOGEN 0.2 04/01/2014 1006   NITRITE NEGATIVE 09/28/2021 1032   LEUKOCYTESUR NEGATIVE 09/28/2021 1032   Sepsis Labs: @LABRCNTIP (procalcitonin:4,lacticidven:4)  ) Recent Results (from the past 240 hour(s))  Resp Panel by RT-PCR (Flu A&B, Covid) Nasopharyngeal Swab     Status: None   Collection Time: 11/13/21  3:23 PM   Specimen: Nasopharyngeal Swab; Nasopharyngeal(NP)  swabs in vial transport medium  Result Value Ref Range Status   SARS Coronavirus 2 by RT PCR NEGATIVE NEGATIVE Final    Comment: (NOTE) SARS-CoV-2 target nucleic acids are NOT DETECTED.  The SARS-CoV-2 RNA is generally detectable in upper respiratory specimens during the acute phase of infection. The lowest concentration of SARS-CoV-2 viral copies this assay can detect is 138 copies/mL. A negative result does not preclude SARS-Cov-2 infection and should not be used as the sole basis for treatment or other patient management decisions. A negative result may occur with  improper specimen collection/handling, submission of specimen other than nasopharyngeal swab, presence of viral mutation(s) within the areas targeted by this assay, and inadequate number of viral copies(<138 copies/mL). A negative result must be combined with clinical observations, patient history,  and epidemiological information. The expected result is Negative.  Fact Sheet for Patients:  EntrepreneurPulse.com.au  Fact Sheet for Healthcare Providers:  IncredibleEmployment.be  This test is no t yet approved or cleared by the [place] FDA and  has been authorized for detection and/or diagnosis of SARS-CoV-2 by FDA under an Emergency Use Authorization (EUA). This EUA will remain  in effect (meaning this test can be used) for the duration of the COVID-19 declaration under Section 564(b)(1) of the Act, 21 U.S.C.section 360bbb-3(b)(1), unless the authorization is terminated  or revoked sooner.       Influenza A by PCR NEGATIVE NEGATIVE Final   Influenza B by PCR NEGATIVE NEGATIVE Final    Comment: (NOTE) The Xpert Xpress SARS-CoV-2/FLU/RSV plus assay is intended as an aid in the diagnosis of influenza from Nasopharyngeal swab specimens and should not be used as a sole basis for treatment. Nasal washings and aspirates are unacceptable for Xpert Xpress SARS-CoV-2/FLU/RSV testing.  Fact Sheet for Patients: EntrepreneurPulse.com.au  Fact Sheet for Healthcare Providers: IncredibleEmployment.be  This test is not yet approved or cleared by the [place] FDA and has been authorized for detection and/or diagnosis of SARS-CoV-2 by FDA under an Emergency Use Authorization (EUA). This EUA will remain in effect (meaning this test can be used) for the duration of the COVID-19 declaration under Section 564(b)(1) of the Act, 21 U.S.C. section 360bbb-3(b)(1), unless the authorization is terminated or revoked.  Performed at [facility] [place] [address] [place], [place] [address]   Blood culture (routine x 2)     Status: None (Preliminary result)   Collection Time: 11/13/21  3:45 PM   Specimen: BLOOD  Result Value Ref Range Status   Specimen Description   Final    BLOOD RIGHT  ANTECUBITAL Performed at [place] [address] [place], [place] [address]    Special Requests   Final    BOTTLES DRAWN AEROBIC AND ANAEROBIC Blood Culture adequate volume Performed at [place] [address] [place], [place] [address]    Culture   Final    NO GROWTH 3 DAYS Performed at [facility], [place] [address] [place], [place] [address]    Report Status PENDING  Incomplete  Blood culture (routine x 2)     Status: None (Preliminary result)   Collection Time: 11/13/21 10:56 PM   Specimen: BLOOD LEFT HAND  Result Value Ref Range Status   Specimen Description   Final    BLOOD LEFT HAND Performed at [place] [address] [place], [place] [address]    Special Requests   Final    BOTTLES DRAWN AEROBIC ONLY Blood Culture adequate  volume Performed at [facility], [place] [address], [place], [place] [address]    Culture   Final    NO GROWTH 2 DAYS Performed at [place] [address], [place], [place] [address]    Report Status PENDING  Incomplete      Studies: No results found.  Scheduled Meds:  albuterol  2.5 mg Nebulization BID   enoxaparin (LOVENOX) injection  40 mg Subcutaneous Q24H   feeding supplement  237 mL Oral BID BM   folic acid  1 mg Oral Daily   multivitamin with minerals  1 tablet Oral Daily   OLANZapine  2.5 mg Oral QHS   oxyCODONE  15 mg Oral Q12H   potassium chloride  20 mEq Oral BID   senna-docusate  2 tablet Oral BID    Continuous Infusions:  famotidine (PEPCID) IV 20 mg (11/16/21 1638)     LOS: 2 days     Kayleen Memos, MD [facility] Pager [phone]  If 7PM-7AM, please contact night-coverage [email or web address] Password [facility] 11/16/2021, 4:46 PM

## 2021-11-16 NOTE — H&P (Signed)
Chief Complaint: Patient was seen in consultation today for painful port site at the request of Irene Pap   Supervising Physician: Arne Cleveland   Patient Status: [facility] - In-pt   History of Present Illness: Virginia [name] is a 66 y.o. 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  She reports having removed a small amount of pus from [place] site and site being exquisitely tender.  Past Medical History:  Diagnosis Date   Anemia    Anxiety    Concussion 09/28/2019   DVT (deep venous thrombosis) ([place]) 2021   L leg   Dyspnea    GERD (gastroesophageal reflux disease)    Hypercholesterolemia    per pt, she does not have elevated lipids   Hypertension    met lung ca dx'd 09/2019   mets to spine, hip and brain   PONV (postoperative nausea and vomiting)    Tobacco abuse     Past Surgical History:  Procedure Laterality Date   ABDOMINAL HYSTERECTOMY     partial/ left ovaries   APPLICATION OF CRANIAL NAVIGATION N/A 12/02/2020   Procedure: APPLICATION OF CRANIAL NAVIGATION;  Surgeon: Erline Levine, MD;  Location: [place];  Service: Neurosurgery;  Laterality: N/A;   CHOLECYSTECTOMY     CRANIOTOMY N/A 12/02/2020   Procedure: Posterior fossa craniotomy for tumor resection with brainlab;  Surgeon: Erline Levine, MD;  Location: [place];  Service: Neurosurgery;  Laterality: N/A;   DILATION AND CURETTAGE OF UTERUS     IR IMAGING GUIDED PORT INSERTION  10/23/2019   KYPHOPLASTY N/A 03/15/2020   Procedure: Thoracic Eight KYPHOPLASTY;  Surgeon: Erline Levine, MD;  Location: [place];  Service: Neurosurgery;  Laterality: N/A;  prone    LIPOMA EXCISION  2018   removed under left breast and right thigh.   TUBAL  LIGATION      Allergies: Patient has no known allergies.  Medications: Prior to Admission medications   Medication Sig Start Date End Date Taking? Authorizing Provider  acetaminophen (TYLENOL) 500 MG tablet Take 1,000 mg by mouth every 8 (eight) hours as needed for moderate pain.   Yes [provider]  albuterol (VENTOLIN HFA) 108 (90 Base) MCG/ACT inhaler INHALE 2 PUFFS INTO THE LUNGS EVERY 6 HOURS AS NEEDED FOR WHEEZING OR SHORTNESS OF BREATH Patient taking differently: Inhale 2 puffs into the lungs every 6 (six) hours as needed for shortness of breath or wheezing. 11/23/20  Yes Baxley, Cresenciano Lick, MD  ALPRAZolam Duanne Moron) 0.25 MG tablet Take 1 tablet (0.25 mg total) by mouth 2 (two) times daily as needed for anxiety 09/28/21  Yes Ledell Peoples IV, MD  amLODipine (NORVASC) 10 MG tablet TAKE 1 TABLET BY MOUTH EVERY DAY 10/11/21  Yes Orson Slick, MD  Cyanocobalamin (VITAMIN B12 PO) Take 1 tablet by mouth daily. Gummies   Yes [provider]  cyclobenzaprine (FLEXERIL) 5 MG tablet Take 5 mg by mouth at bedtime as needed for muscle spasms. 04/13/21  Yes [provider]  diphenhydrAMINE (BENADRYL) 50 MG tablet Take 1 tablet (50 mg total) by mouth at bedtime for 10 days. 11/16/21 11/26/21 Yes Hall, Carole N, DO  EPINEPHrine 0.3 mg/0.3 mL IJ SOAJ injection Inject 0.3 mg into the muscle as needed  for anaphylaxis. 11/16/21  Yes Kayleen Memos, DO  fexofenadine (ALLEGRA ALLERGY) 180 MG tablet Take 1 tablet (180 mg total) by mouth daily for 10 days. 11/16/21 11/26/21 Yes Kayleen Memos, DO  folic acid (FOLVITE) 1 MG tablet Take 1 tablet by mouth daily. 10/16/21 10/16/22 Yes Orson Slick, MD  furosemide (LASIX) 20 MG tablet Take 2 tablets (40 mg total) by mouth 2 times daily. Patient taking differently: Take 20 mg by mouth 2 (two) times daily. 11/09/21  Yes Orson Slick, MD  lidocaine-prilocaine (EMLA) cream Apply to port as needed. Patient taking differently: Apply 1 application.  topically as needed (acess port). 02/17/21  Yes Orson Slick, MD  meclizine (ANTIVERT) 25 MG tablet Take 1 tablet (25 mg total) by mouth 3 (three) times daily as needed for dizziness. 09/28/21  Yes Vaslow, Acey Lav, MD  Multiple Vitamins-Minerals (AIRBORNE PO) Take 1 tablet by mouth daily.    Yes [provider]  ofloxacin (OCUFLOX) 0.3 % ophthalmic solution 2 drops each eye 4 times a day for 5 days Patient taking differently: Place 2 drops into both eyes daily as needed (irritation). 02/23/20  Yes Baxley, Cresenciano Lick, MD  OLANZapine (ZYPREXA) 2.5 MG tablet TAKE 1 TABLET BY MOUTH EVERY NIGHT AT BEDTIME 07/24/21 07/24/22 Yes Orson Slick, MD  ondansetron (ZOFRAN) 4 MG tablet TAKE 1 TO 2 TABLETS BY MOUTH 2 TIMES DAILY AS NEEDED Patient taking differently: Take 4-8 mg by mouth 2 (two) times daily as needed for nausea or vomiting. 12/06/20 12/06/21 Yes Marvis Moeller, NP  oxyCODONE (OXY IR/ROXICODONE) 5 MG immediate release tablet TAKE 1 TO 2 TABLETS BY MOUTH EVERY 4 HOURS AS NEEDED FOR SEVERE PAIN 10/21/21 04/19/22 Yes Orson Slick, MD  pantoprazole (PROTONIX) 40 MG tablet TAKE 1 TABLET BY MOUTH ONCE DAILY 09/17/21  Yes Baxley, Cresenciano Lick, MD  polyethylene glycol (MIRALAX / GLYCOLAX) 17 g packet Take 17 g by mouth every evening.   Yes [provider]  potassium chloride SA (KLOR-CON M) 20 MEQ tablet Take 2 tablets in the morning and 1 tablet in the evening. Patient taking differently: 20 mEq 2 (two) times daily. 11/09/21  Yes Orson Slick, MD  predniSONE (DELTASONE) 10 MG tablet Take 5 tablets daily x2 days then decrease by 1 tablet every other day (5-5-4-4-3-3-2-2-1-1) 11/16/21  Yes Kayleen Memos, DO  scopolamine (TRANSDERM-SCOP) 1 MG/3DAYS Place 1 patch (1.5 mg total) onto the skin every 3 (three) days. 10/31/21  Yes Vaslow, Acey Lav, MD  Vitamin D3 (VITAMIN D) 25 MCG tablet Take 2,000 Units by mouth every evening.   Yes [provider]  vitamin E 180 MG (400 UNITS)  capsule Take 400 IU daily x 1 week, then 400 IU BID Patient taking differently: Take 400 Units by mouth daily. 08/22/20  Yes [name], MD  dexamethasone (DECADRON) 1 MG tablet Take 2 tablets by mouth daily. Patient not taking: Reported on 11/09/2021 09/12/21   Ventura Sellers, MD  pentoxifylline (TRENTAL) 400 MG CR tablet TAKE 1 TABLET BY MOUTH WITH FOOD FOR 1 WEEK THEN TAKE 1 TABLET BY MOUTH 2 TIMES DAILY THEREAFTER Patient not taking: Reported on 11/09/2021 01/02/21 01/02/22  [name], MD  promethazine (PHENERGAN) 12.5 MG tablet Take 1 to 2 tablets by mouth every 6 hours as needed for nausea Patient not taking: Reported on 11/13/2021 05/02/21     XTAMPZA ER 13.5 MG C12A Take 1 capsule by mouth in  the morning and at bedtime. (every 12 hours) 11/16/21 05/15/22  Orson Slick, MD     Family History  Problem Relation Age of Onset   Heart disease Father    Drug abuse Daughter    Drug abuse Son    Cancer Sister    Heart disease Brother    Heart attack Brother     Social History   Socioeconomic History   Marital status: Married    Spouse name: Not on file   Number of children: 3   Years of education: Not on file   Highest education level: Not on file  Occupational History   Occupation: owns Academic librarian [employer]    Employer: [employer]  Tobacco Use   Smoking status: Former    Packs/day: 0.25    Types: Cigarettes    Quit date: 10/31/2012    Years since quitting: 9.0   Smokeless tobacco: Never  Vaping Use   Vaping Use: Never used  Substance and Sexual Activity   Alcohol use: Yes    Alcohol/week: 0.0 standard drinks    Comment: rare   Drug use: No   Sexual activity: Yes  Other Topics Concern   Not on file  Social History Narrative   Not on file   Social Determinants of Health   Financial Resource Strain: Not on file  Food Insecurity: Not on file  Transportation Needs: Not on file  Physical Activity: Not on file  Stress: Not on file  Social Connections: Not on file     Review of Systems  Constitutional:  Positive for fever.  HENT: Negative.    Eyes: Negative.   Respiratory: Negative.    Cardiovascular: Negative.   Gastrointestinal: Negative.   Endocrine: Negative.   Genitourinary: Negative.   Musculoskeletal: Negative.   Skin:  Positive for rash.  Allergic/Immunologic: Negative.   Neurological: Negative.   Hematological: Negative.   Psychiatric/Behavioral: Negative.     Vital Signs: BP 140/88 (BP Location: Left Arm)    Pulse 88    Temp 97.7 F (36.5 C) (Oral)    Resp 17    Ht _0  (1.575 m)    Wt 172 lb 6.4 oz (78.2 kg)    SpO2 94%    BMI 31.53 kg/m   Physical Exam Constitutional:      General: She is not in acute distress.    Appearance: Normal appearance.  HENT:     Mouth/Throat:     Pharynx: Oropharynx is clear.  Eyes:     Extraocular Movements: Extraocular movements intact.  Cardiovascular:     Rate and Rhythm: Normal rate.  Pulmonary:     Effort: Pulmonary effort is normal. No respiratory distress.  Abdominal:     General: Abdomen is flat.     Palpations: Abdomen is soft.  Skin:    General: Skin is warm and dry.     Findings: Rash present.     Comments: Port access site: mild redness, scab where recently accessed, pain elicited at physical exam.      Imaging: MR BRAIN W WO CONTRAST  Result Date: 10/27/2021 CLINICAL DATA:  Brain/CNS neoplasm. Assess treatment response. Radiation and chemotherapy. Metastatic lung cancer. EXAM: MRI HEAD WITHOUT AND WITH CONTRAST TECHNIQUE: Multiplanar, multiecho pulse sequences of the brain and surrounding structures were obtained without and with intravenous contrast. CONTRAST:  78m MULTIHANCE GADOBENATE DIMEGLUMINE 529 MG/ML IV SOLN COMPARISON:  08/24/2021.  05/25/2021. FINDINGS: Brain: Marked favorable changes since the study of December 15. Numerous foci of  abnormal enhancement throughout the cerebellum show marked reduction in contrast enhancement. The single exception to this is a  rounded focus in the posterior right cerebellum on axial image 31 which remains visible as a round focus, but is perhaps a mm smaller, measuring 3 mm today. Restricted diffusion seen within the left middle cerebellar peduncle appears similar. No worsening or progressive finding in the posterior fossa. In the supratentorial brain, there are similarly favorable findings without any new or worsening findings. Foci of enhancement previously seen in the right occipital lobe axial image 57, the right parietal lobe axial image 90 for and the left parietal lobe axial image 117 oral diminishing and less conspicuous. No new or progressive supra tentorial lesion. No hydrocephalus.  No extra-axial collection. Vascular: Major vessels at the base of the brain show flow. Skull and upper cervical spine: Otherwise negative Sinuses/Orbits: Clear/normal Other: None IMPRESSION: Favorable interval changes. Multiple foci of abnormal enhancement seen throughout the cerebellum are becoming less conspicuous with marked reduction in enhancement. The single exception to this is a 3 mm rounded focus in the posterior cerebellum on the right axial image 30/31, which does remain visible, though it may be a mm smaller. Scattered enhancing foci within the cerebral hemispheres are similarly diminishing. These are marked with arrows on today's study and correlated with the foci seen 08/24/2021. No supratentorial new or progressive lesion. Electronically Signed   By: Nelson Chimes M.D.   On: 10/27/2021 23:14   DG Chest Port 1 View  Result Date: 11/13/2021 CLINICAL DATA:  Cough EXAM: PORTABLE CHEST 1 VIEW COMPARISON:  Chest x-ray 12/17/2019 FINDINGS: Cardiomediastinal silhouette is stable and within normal limits. Mild calcified plaques in the aortic arch. Right-sided central venous port with the tip in the SVC. No focal consolidation identified in the lungs. No pleural effusion or pneumothorax. IMPRESSION: No acute intrathoracic process identified.  Electronically Signed   By: Ofilia Neas M.D.   On: 11/13/2021 17:17   VAS [name] LOWER EXTREMITY VENOUS (DVT) (7a-7p)  Result Date: 11/13/2021  Lower Venous DVT Study Patient Name:  MARCI POLITO  Date of Exam:   11/13/2021 Medical Rec #: [id number]          Accession #:    [id number] Date of Birth: 06-21-1956         Patient Gender: F Patient Age:   10 years Exam Location:  [facility] Procedure:      VAS [name] LOWER EXTREMITY VENOUS (DVT) Referring Phys: Domenic Moras --------------------------------------------------------------------------------  Indications: Swelling.  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 Comparison Study: Previous exam on 03/19/2020 was negative for DVT Performing Technologist: Rogelia Rohrer RVT, RDMS  Examination Guidelines: A complete evaluation includes B-mode imaging, spectral Doppler, color Doppler, and power Doppler as needed of all accessible portions of each vessel. Bilateral testing is considered an integral part of a complete examination. Limited examinations for reoccurring indications may be performed as noted. The reflux portion of the exam is performed with the patient in reverse Trendelenburg.  +---------+---------------+---------+-----------+----------+-------------------+  RIGHT     Compressibility Phasicity Spontaneity Properties Thrombus Aging       +---------+---------------+---------+-----------+----------+-------------------+  CFV       Full            Yes       Yes                                         +---------+---------------+---------+-----------+----------+-------------------+  SFJ       Full                                                                  +---------+---------------+---------+-----------+----------+-------------------+  FV Prox   Full            Yes       Yes                                         +---------+---------------+---------+-----------+----------+-------------------+  FV Mid    Full             Yes       Yes                                         +---------+---------------+---------+-----------+----------+-------------------+  FV Distal Full            Yes       Yes                                         +---------+---------------+---------+-----------+----------+-------------------+  PFV       Full                                                                  +---------+---------------+---------+-----------+----------+-------------------+  POP       Full            Yes       Yes                                         +---------+---------------+---------+-----------+----------+-------------------+  PTV       Full                                                                  +---------+---------------+---------+-----------+----------+-------------------+  PERO      Full                                             Not well visualized  +---------+---------------+---------+-----------+----------+-------------------+   +----+---------------+---------+-----------+----------+--------------+  LEFT Compressibility Phasicity Spontaneity Properties Thrombus Aging  +----+---------------+---------+-----------+----------+--------------+  CFV  Full            Yes       Yes                                    +----+---------------+---------+-----------+----------+--------------+  Summary: RIGHT: - There is no evidence of deep vein thrombosis in the lower extremity. - There is no evidence of superficial venous thrombosis.  - No cystic structure found in the popliteal fossa. - Ultrasound characteristics of enlarged lymph nodes are noted in the groin. -Subcutaneous edema seen in area of calf.  LEFT: - No evidence of common femoral vein obstruction. - Ultrasound characteristics of enlarged lymph nodes noted in the groin.  *See table(s) above for measurements and observations. Electronically signed by Servando Snare MD on 11/13/2021 at 5:15:23 PM.    Final     Labs:  CBC: Recent Labs    11/13/21 1550  11/14/21 0616 11/15/21 0601 11/16/21 0605  WBC 7.1 4.8 3.2* 3.9*  HGB 12.2 10.1* 9.4* 8.9*  HCT 34.0* 28.8* 26.8* 25.3*  PLT 319 224 172 156    COAGS: No results for input(s): INR, APTT in the last 8760 hours.  BMP: Recent Labs    11/09/21 1029 11/13/21 1550 11/14/21 0616 11/15/21 0601  NA 138 133* 130* 135  K 3.1* 3.0* 3.6 4.7  CL 104 96* 95* 103  CO2 _0 GLUCOSE 124* 112* 107* 123*  BUN 5* 7* 7* 7*  CALCIUM 8.1* 8.3* 7.7* 8.3*  CREATININE 0.61 0.78 0.69 0.56  GFRNONAA >60 >60 >60 >60    LIVER FUNCTION TESTS: Recent Labs    09/28/21 1032 10/20/21 1115 11/09/21 1029 11/15/21 0601  BILITOT 0.3 0.5 0.6 0.4  AST 14* 18 20 32  ALT _1 ALKPHOS 70 69 68 55  PROT 7.1 6.8 6.5 6.0*  ALBUMIN 3.6 3.7 3.3* 2.5*    TUMOR MARKERS: No results for input(s): AFPTM, CEA, CA199, CHROMGRNA in the last 8760 hours.  Assessment and Plan:  Metastatic lung cancer --has port for chemo treatments --just completed a chemo round this week --tenderness with mild redness could represent brewing infection --patient would like to proceed with port removal with sedation at this time. --will tentatively plan for removal tomorrow, 3/10. --NPO midnight tonight.  Risks and benefits of port-a-catheter removal was discussed with the patient including, but not limited to bleeding, infection, pneumothorax, or fibrin sheath development and need for additional procedures.  All of the patient's questions were answered, patient is agreeable to proceed. Consent signed and in chart.   Thank you for this interesting consult.  I greatly enjoyed meeting KMYA PLACIDE and look forward to participating in their care.  A copy of this report was sent to the requesting provider on this date.  Electronically Signed: Pasty Spillers, PA 11/16/2021, 4:12 PM   I spent a total of 20 Minutes in face to face in clinical consultation, greater than 50% of which was counseling/coordinating  care for port removal

## 2021-11-16 NOTE — Evaluation (Signed)
Occupational Therapy Evaluation ?Patient Details ?Name: Virginia [name] ?MRN: [id number] ?DOB: Feb 05, 1956 ?Today's Date: 11/16/2021 ? ? ?History of Present Illness Patient is a 66 year old female admitted with febrile illness associated with rash. 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 ?Clinical Impression ?  ?Patient lives at home with spouse, uses rollator for ambulation and reports spouse assist her in/out of tub as well as for IADL tasks. Patient mod I with BADLs. Currently patient min G assist for functional transfers needing verbal cues for safety as she does not lock brakes during sit<>stand. Also pushing walker far in front of her during turns. Patient does have L upper and lower extremity motor coordination deficits 2* surgery/brain metastases. Recommend continued acute OT services to maximize patient safety and independence with daily routine, do not anticipate OT needs at D/C.  ?   ? ?Recommendations for follow up therapy are one component of a multi-disciplinary discharge planning process, led by the attending physician.  Recommendations may be updated based on patient status, additional functional criteria and insurance authorization.  ? ?Follow Up Recommendations ? No OT follow up  ?  ?Assistance Recommended at Discharge Intermittent Supervision/Assistance  ?Patient can return home with the following A little help with walking and/or transfers;A little help with bathing/dressing/bathroom;Direct supervision/assist for medications management;Direct supervision/assist for financial management;Assist for transportation;Help with stairs or ramp for entrance;Assistance with cooking/housework ? ?  ?Functional Status Assessment ? Patient has had a recent decline in their functional status and demonstrates the ability to make significant improvements in function in a reasonable and predictable amount of  time.  ?Equipment Recommendations ? None recommended by OT  ?  ?   ?Precautions / Restrictions Precautions ?Precautions: Fall ?Restrictions ?Weight Bearing Restrictions: No  ? ?  ? ?Mobility Bed Mobility ?  ?  ?  ?  ?  ?  ?  ?General bed mobility comments: Seated EOB upon arrival. ?  ? ? ? ?  ?Balance Overall balance assessment: Mild deficits observed, not formally tested ?  ?  ?  ?  ?  ?  ?  ?  ?  ?  ?  ?  ?  ?  ?  ?  ?  ?  ?   ? ?ADL either performed or assessed with clinical judgement  ? ?ADL Overall ADL's : Needs assistance/impaired ?Eating/Feeding: Set up;Sitting ?  ?Grooming: Set up;Sitting ?  ?Upper Body Bathing: Set up;Sitting ?  ?Lower Body Bathing: Min guard;Sit to/from stand;Sitting/lateral leans ?  ?Upper Body Dressing : Set up;Sitting ?  ?Lower Body Dressing: Set up;Min guard;Sitting/lateral leans;Sit to/from stand ?Lower Body Dressing Details (indicate cue type and reason): In seated position patient is able to doff/don socks ?Toilet Transfer: Min guard;Ambulation;Rollator (4 wheels) ?Toilet Transfer Details (indicate cue type and reason): Min G for safety as patient does push rollator far in front of her. Needs verbal cues for safety to lock brakes prior to sitting. ?Toileting- Water quality scientist and Hygiene: Min guard;Sitting/lateral lean;Sit to/from stand ?  ?  ?  ?Functional mobility during ADLs: Min guard;Rollator (4 wheels) ?General ADL Comments: Patient appears close to her baseline, note safety deficits with rollator not remembering to lock brakes during sit <> stand  ? ? ? ? ?Pertinent Vitals/Pain Pain Assessment ?Pain Assessment: 0-10 ?Pain Score: 8  ?Pain Location: mid lower back ?Pain Descriptors / Indicators: Sore ?Pain Intervention(s): Premedicated before session, Limited activity within  patient's tolerance  ? ? ? ?Hand Dominance Right ?  ?Extremity/Trunk Assessment Upper Extremity Assessment ?Upper Extremity Assessment: LUE deficits/detail;RUE deficits/detail ?RUE Deficits / Details:  Grossly within functional limits ?LUE Coordination: decreased gross motor;decreased fine motor ?  ?Lower Extremity Assessment ?Lower Extremity Assessment: Defer to PT evaluation ?  ?Cervical / Trunk Assessment ?Cervical / Trunk Assessment: Normal ?  ?Communication Communication ?Communication: No difficulties ?  ?Cognition Arousal/Alertness: Awake/alert ?Behavior During Therapy: [facility] for tasks assessed/performed ?Overall Cognitive Status: Within Functional Limits for tasks assessed ?  ?  ?  ?  ?  ?  ?  ?  ?  ?  ?  ?  ?  ?  ?  ?  ?General Comments: A/O x3 ?  ?  ?   ?   ?   ? ? ?Home Living Family/patient expects to be discharged to:: Private residence ?Living Arrangements: Spouse/significant other ?Available Help at Discharge: Family;Available 24 hours/day ?Type of Home: House ?Home Access: Stairs to enter ?Entrance Stairs-Number of Steps: 5 ?Entrance Stairs-Rails: Left ?Home Layout: Two level;Bed/bath upstairs ?Alternate Level Stairs-Number of Steps: 18 ?Alternate Level Stairs-Rails: Right ?Bathroom Shower/Tub: Walk-in shower ?  ?Bathroom Toilet: Handicapped height ?  ?  ?Home Equipment: Rollator (4 wheels);Wheelchair - manual ?  ?Additional Comments: Some info not consistent from eval 3/22 ?  ? ?  ?Prior Functioning/Environment Prior Level of Function : Needs assist ?  ?  ?  ?  ?  ?  ?Mobility Comments: Uses rollator for ambulation ?ADLs Comments: Spouse assists with transfer in/out of tub shower, will hold onto patient when going up/down stairs. Spouse does medication management, meal prep. Patient reports she can bath, dress, toilet herself ?  ? ?  ?  ?OT Problem List: Decreased activity tolerance;Impaired balance (sitting and/or standing);Decreased safety awareness ?  ?   ?OT Treatment/Interventions: Self-care/ADL training;Balance training;Patient/family education;Cognitive remediation/compensation;Therapeutic activities  ?  ?OT Goals(Current goals can be found in the care plan section) Acute Rehab OT  Goals ?Patient Stated Goal: Home with spouse ?OT Goal Formulation: With patient ?Time For Goal Achievement: 11/30/21 ?Potential to Achieve Goals: Good  ?OT Frequency: Min 2X/week ?  ? ?   ?AM-PAC OT "6 Clicks" Daily Activity     ?Outcome Measure Help from another person eating meals?: A Little ?Help from another person taking care of personal grooming?: A Little ?Help from another person toileting, which includes using toliet, bedpan, or urinal?: A Little ?Help from another person bathing (including washing, rinsing, drying)?: A Little ?Help from another person to put on and taking off regular upper body clothing?: A Little ?Help from another person to put on and taking off regular lower body clothing?: A Little ?6 Click Score: 18 ?  ?End of Session Equipment Utilized During Treatment: Rollator (4 wheels) ?Nurse Communication: Mobility status ? ?Activity Tolerance: Patient tolerated treatment well ?Patient left: in chair;with call bell/phone within reach ? ?OT Visit Diagnosis: Unsteadiness on feet (R26.81)  ?              ?Time: 8828-0034 ?OT Time Calculation (min): 20 min ?Charges:  OT General Charges ?$OT Visit: 1 Visit ?OT Evaluation ?$OT Eval Low Complexity: 1 Low ? ?Delbert Phenix OT ?OT pager: [phone] ? ?Rosemary Holms ?11/16/2021, 10:02 AM ?

## 2021-11-16 NOTE — Progress Notes (Signed)
Pt expressed concern of possible infection at right chest port site. She states " I want to make sure we get it taken care of before I leave. I saw pus come out of it the other day". ?Chest port site is noted to have mild redness upon assessment. ? ?MD Nevada Crane notified and aware.  ?

## 2021-11-17 ENCOUNTER — Inpatient Hospital Stay: Payer: Self-pay

## 2021-11-17 ENCOUNTER — Telehealth: Payer: Self-pay | Admitting: *Deleted

## 2021-11-17 ENCOUNTER — Inpatient Hospital Stay (HOSPITAL_COMMUNITY): Payer: BC Managed Care – PPO

## 2021-11-17 ENCOUNTER — Other Ambulatory Visit (HOSPITAL_COMMUNITY): Payer: Self-pay

## 2021-11-17 DIAGNOSIS — I38 Endocarditis, valve unspecified: Secondary | ICD-10-CM

## 2021-11-17 HISTORY — PX: IR REMOVAL TUN ACCESS W/ PORT W/O FL MOD SED: IMG2290

## 2021-11-17 LAB — PHOSPHORUS: Phosphorus: 3.9 mg/dL (ref 2.5–4.6)

## 2021-11-17 LAB — COMPREHENSIVE METABOLIC PANEL
ALT: 34 U/L (ref 0–44)
AST: 39 U/L (ref 15–41)
Albumin: 2.8 g/dL — ABNORMAL LOW (ref 3.5–5.0)
Alkaline Phosphatase: 56 U/L (ref 38–126)
Anion gap: 10 (ref 5–15)
BUN: 11 mg/dL (ref 8–23)
CO2: 24 mmol/L (ref 22–32)
Calcium: 8.4 mg/dL — ABNORMAL LOW (ref 8.9–10.3)
Chloride: 106 mmol/L (ref 98–111)
Creatinine, Ser: 0.56 mg/dL (ref 0.44–1.00)
GFR, Estimated: >60 mL/min (ref >60)
Glucose, Bld: 165 mg/dL — ABNORMAL HIGH (ref 70–99)
Potassium: 4.1 mmol/L (ref 3.5–5.1)
Sodium: 140 mmol/L (ref 135–145)
Total Bilirubin: 0.2 mg/dL — ABNORMAL LOW (ref 0.3–1.2)
Total Protein: 6.5 g/dL (ref 6.5–8.1)

## 2021-11-17 LAB — ECHOCARDIOGRAM COMPLETE
AR max vel: 3.41 cm2
AV Area VTI: 3.07 cm2
AV Area mean vel: 3.31 cm2
AV Mean grad: 3 mmHg
AV Peak grad: 5.4 mmHg
Ao pk vel: 1.16 m/s
Area-P 1/2: 10.25 cm2
Calc EF: 47.8 %
MV VTI: 2.83 cm2
S' Lateral: 2.6 cm
Single Plane A2C EF: 56.9 %
Single Plane A4C EF: 37.6 %

## 2021-11-17 LAB — CBC WITH DIFFERENTIAL/PLATELET
Abs Immature Granulocytes: 0.44 10*3/uL — ABNORMAL HIGH (ref 0.00–0.07)
Basophils Absolute: 0.1 10*3/uL (ref 0.0–0.1)
Basophils Relative: 1 %
Eosinophils Absolute: 0 10*3/uL (ref 0.0–0.5)
Eosinophils Relative: 0 %
HCT: 29.7 % — ABNORMAL LOW (ref 36.0–46.0)
Hemoglobin: 10.3 g/dL — ABNORMAL LOW (ref 12.0–15.0)
Immature Granulocytes: 6 %
Lymphocytes Relative: 17 %
Lymphs Abs: 1.3 10*3/uL (ref 0.7–4.0)
MCH: 35.5 pg — ABNORMAL HIGH (ref 26.0–34.0)
MCHC: 34.7 g/dL (ref 30.0–36.0)
MCV: 102.4 fL — ABNORMAL HIGH (ref 80.0–100.0)
Monocytes Absolute: 1.3 10*3/uL — ABNORMAL HIGH (ref 0.1–1.0)
Monocytes Relative: 16 %
Neutro Abs: 4.7 10*3/uL (ref 1.7–7.7)
Neutrophils Relative %: 60 %
Platelets: 195 10*3/uL (ref 150–400)
RBC: 2.9 MIL/uL — ABNORMAL LOW (ref 3.87–5.11)
RDW: 13.3 % (ref 11.5–15.5)
WBC: 7.8 10*3/uL (ref 4.0–10.5)
nRBC: 0.9 % — ABNORMAL HIGH (ref 0.0–0.2)

## 2021-11-17 LAB — MAGNESIUM: Magnesium: 1.9 mg/dL (ref 1.7–2.4)

## 2021-11-17 MED ORDER — FENTANYL CITRATE (PF) 100 MCG/2ML IJ SOLN
INTRAMUSCULAR | Status: AC
  Filled 2021-11-17: qty 2

## 2021-11-17 MED ORDER — LIDOCAINE-EPINEPHRINE 1 %-1:100000 IJ SOLN
INTRAMUSCULAR | Status: AC
  Filled 2021-11-17: qty 1

## 2021-11-17 MED ORDER — MIDAZOLAM HCL 2 MG/2ML IJ SOLN
INTRAMUSCULAR | Status: AC
  Filled 2021-11-17: qty 4

## 2021-11-17 MED ORDER — EPINEPHRINE 0.3 MG/0.3ML IJ SOAJ
0.3000 mg | INTRAMUSCULAR | 0 refills | Status: DC | PRN
  Filled 2021-11-17: qty 2, fill #0

## 2021-11-17 MED ORDER — DIPHENHYDRAMINE HCL 25 MG PO CAPS
50.0000 mg | ORAL_CAPSULE | Freq: Every day | ORAL | Status: DC
  Administered 2021-11-17 – 2021-11-18 (×2): 50 mg via ORAL
  Filled 2021-11-17 (×2): qty 2

## 2021-11-17 MED ORDER — FEXOFENADINE HCL 180 MG PO TABS
180.0000 mg | ORAL_TABLET | Freq: Every day | ORAL | 0 refills | Status: DC
  Filled 2021-11-17: qty 8, 8d supply, fill #0

## 2021-11-17 MED ORDER — MIDAZOLAM HCL 2 MG/2ML IJ SOLN
INTRAMUSCULAR | Status: AC | PRN
  Administered 2021-11-17: 1 mg via INTRAVENOUS

## 2021-11-17 MED ORDER — MIDAZOLAM HCL 2 MG/2ML IJ SOLN
INTRAMUSCULAR | Status: AC | PRN
Start: 2021-11-17 — End: 2021-11-17
  Administered 2021-11-17: 1 mg via INTRAVENOUS

## 2021-11-17 MED ORDER — LIDOCAINE HCL (PF) 1 % IJ SOLN
INTRAMUSCULAR | Status: AC | PRN
  Administered 2021-11-17: 10 mL via INTRADERMAL

## 2021-11-17 MED ORDER — CEFADROXIL 500 MG PO CAPS
1000.0000 mg | ORAL_CAPSULE | Freq: Two times a day (BID) | ORAL | Status: DC
  Administered 2021-11-17: 1000 mg via ORAL
  Filled 2021-11-17 (×2): qty 2

## 2021-11-17 MED ORDER — SODIUM CHLORIDE 0.9 % IV SOLN
8.0000 mg/kg | Freq: Every day | INTRAVENOUS | Status: DC
  Administered 2021-11-17 – 2021-11-18 (×2): 650 mg via INTRAVENOUS
  Filled 2021-11-17 (×2): qty 13

## 2021-11-17 MED ORDER — MIDAZOLAM HCL 2 MG/2ML IJ SOLN
INTRAMUSCULAR | Status: AC
  Filled 2021-11-17: qty 2

## 2021-11-17 MED ORDER — SODIUM CHLORIDE 0.9% FLUSH: 10.0000 mL | INTRAVENOUS | Status: DC | PRN

## 2021-11-17 MED ORDER — FENTANYL CITRATE (PF) 100 MCG/2ML IJ SOLN
INTRAMUSCULAR | Status: AC | PRN
  Administered 2021-11-17: 50 ug via INTRAVENOUS

## 2021-11-17 MED ORDER — PREDNISONE 20 MG PO TABS
40.0000 mg | ORAL_TABLET | Freq: Every day | ORAL | Status: DC
Start: 2021-11-17 — End: 2021-11-19
  Administered 2021-11-17 – 2021-11-18 (×2): 40 mg via ORAL
  Filled 2021-11-17 (×2): qty 2

## 2021-11-17 MED ORDER — SODIUM CHLORIDE 0.9% FLUSH
10.0000 mL | Freq: Two times a day (BID) | INTRAVENOUS | Status: DC
  Administered 2021-11-17 – 2021-11-18 (×2): 10 mL

## 2021-11-17 MED ORDER — CHLORHEXIDINE GLUCONATE CLOTH 2 % EX PADS
6.0000 | MEDICATED_PAD | Freq: Every day | CUTANEOUS | Status: DC
  Administered 2021-11-18: 6 via TOPICAL

## 2021-11-17 MED ORDER — LORATADINE 10 MG PO TABS
10.0000 mg | ORAL_TABLET | Freq: Every day | ORAL | Status: DC
  Administered 2021-11-17 – 2021-11-18 (×2): 10 mg via ORAL
  Filled 2021-11-17 (×2): qty 1

## 2021-11-17 MED ORDER — CEFADROXIL 500 MG PO CAPS
1000.0000 mg | ORAL_CAPSULE | Freq: Two times a day (BID) | ORAL | 0 refills | Status: DC
  Filled 2021-11-17: qty 56, 14d supply, fill #0

## 2021-11-17 MED ORDER — PREDNISONE 10 MG PO TABS
10.0000 mg | ORAL_TABLET | Freq: Every day | ORAL | 0 refills | Status: DC
  Filled 2021-11-17: qty 30, 30d supply, fill #0

## 2021-11-17 MED ORDER — DIPHENHYDRAMINE HCL 50 MG PO TABS
50.0000 mg | ORAL_TABLET | Freq: Every day | ORAL | 0 refills | Status: DC
  Filled 2021-11-17: qty 8, 8d supply, fill #0

## 2021-11-17 MED ORDER — CIPROFLOXACIN HCL 500 MG PO TABS
500.0000 mg | ORAL_TABLET | Freq: Two times a day (BID) | ORAL | Status: DC
  Administered 2021-11-17 – 2021-11-18 (×3): 500 mg via ORAL
  Filled 2021-11-17 (×3): qty 1

## 2021-11-17 NOTE — Progress Notes (Addendum)
Confirmed with phlebotomy and lab that blood culture drawn at 0800, 11/17/21 and labeled as being drawn from central line was actually drawn from peripheral stick in left hand. No culture drawn from implanted port at any time on 11/17/21 d/t lack of blood return. MD and unit RN aware. ?

## 2021-11-17 NOTE — Consult Note (Addendum)
?   ? ? ? ? ?[facility] for Infectious Disease   ? ?Date of Admission:  11/13/2021   Total days of inpatient antibiotics 2 ? ?      ?Reason for Consult: Port site infection    ?Principal Problem: ?  Febrile illness ?Active Problems: ?  Hypertension ?  Brain metastases ([place]) ?  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  Port-A-Cath in place ?  Skin rash ?  Hypokalemia ?  Cancer associated pain ?  Rash ?  Hyponatremia ? ? ?Assessment: ?11 YF with metastatic lung cancer admitted for rash with concern right leg cellulitis and  found to have pustule from port site. Port site drained purulent material during hospitalization. ? ?#Port site infection ?-Port pulled on 3/10 ?-Blood cx from admission (peripheral) no growth). Unable to obtain Cx from port due to lack of blood return(attempted today).  ?-Prot was last accessed on 3/2 per pt, she reports she had drainage from port once before about 3 months ago.  ?Recommendations:  ?-D/C cefepime  ?-Place PICC ?-Will need 2 weeks of antibiotics from port removal with vancomycin and ciprofloxacin. Transition to vancomycin to daptomycin at discharge. ?-ID follow-up appointment scheduled ? ? ?#Fine rash through chest with papular lesions on legs ?-Etiology unclear. Possible reaction to scopolamine.  ? ? ?OPAT ORDERS: ? ?Diagnosis: Port site infection ? ?Culture Result: none ? ?No Known Allergies ? ? ?Discharge antibiotics to be given via PICC line: ? ?Per pharmacy protocol Daptomycin ? ? ?Duration: ?2 weeks ?End Date: ?12/01/21 ? ?[facility] Care Per Protocol with Biopatch Use: ?Home health RN for IV administration and teaching, line care and labs.   ? ?Labs weekly while on IV antibiotics: ?__ CBC with differential ?__ CMP ?__ CRP ?__ ESR ?__ CK ?__ Please pull PIC at completion of IV antibiotics ? ? ?Fax weekly labs to [phone] ? ?Clinic Follow Up Appt: ?3/22 at 930 AM ? ?@ [facility] with Dr. Candiss Norse  ?Microbiology:   ?Antibiotics: ?Vancomycin 3/6, 3/9-p ?Cefepime 3/6,  3/9-p ? ?Cultures: ?Blood ?3/6 NG ? ? ?HPI: Virginia [name] is a 66 y.o. female with metastatic adenocarcinoma of the lung mets to bone and brain on chemotherapy(Keytruda, Alimta last infusion 11/09/2021 via port), LLE DVT not on [facility] as pt had brain bleed in August, 2021, HTN, anxiety, chronic pain admitted for rash. Pt reports URI symptoms of cough, rhinorrhea, fevers at home(Tmax 101). She developed pruritic red rash starting at her chest and involving extremities. Her only new medication was scopolamine patch that started 2 weeks ago for dizziness. On arrival, vitals stable, wbc 7K. Pustule noted at right port site. Given she is immunocompromised, started on vancomycin and cefepime for concern of superimposed cellulitis of the right leg/port site pustule. During hospitalization she had purulent drainage form port. ID consulted for possible for line infection. Review of Systems: ?Review of Systems  ?All other systems reviewed and are negative. ? ?Past Medical History:  ?Diagnosis Date  ? Anemia   ? Anxiety   ? Concussion 09/28/2019  ? DVT (deep venous thrombosis) ([place]) 2021  ? L leg  ? Dyspnea   ? GERD (gastroesophageal reflux disease)   ? Hypercholesterolemia   ? per pt, she does not have elevated lipids  ? Hypertension   ? 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 PONV (postoperative nausea and vomiting)   ? Tobacco abuse   ? ? ?Social History  ? ?Tobacco  Use  ? Smoking status: Former  ?  Packs/day: 0.25  ?  Types: Cigarettes  ?  Quit date: 10/31/2012  ?  Years since quitting: 9.0  ? Smokeless tobacco: Never  ?Vaping Use  ? Vaping Use: Never used  ?Substance Use Topics  ? Alcohol use: Yes  ?  Alcohol/week: 0.0 standard drinks  ?  Comment: rare  ? Drug use: No  ? ? ?Family History  ?Problem Relation Age of Onset  ? Heart disease Father   ? Drug abuse Daughter   ? Drug abuse Son   ? Cancer Sister   ? Heart disease Brother   ? Heart attack Brother   ? ?Scheduled Meds: ? albuterol  2.5 mg Nebulization  BID  ? diphenhydrAMINE  12.5 mg Intravenous TID  ? enoxaparin (LOVENOX) injection  40 mg Subcutaneous Q24H  ? feeding supplement  237 mL Oral BID BM  ? folic acid  1 mg Oral Daily  ? lidocaine-EPINEPHrine      ? methylPREDNISolone (SOLU-MEDROL) injection  40 mg Intravenous TID  ? multivitamin with minerals  1 tablet Oral Daily  ? OLANZapine  2.5 mg Oral QHS  ? oxyCODONE  15 mg Oral Q12H  ? potassium chloride  20 mEq Oral BID  ? senna-docusate  2 tablet Oral BID  ? ?Continuous Infusions: ? ceFEPime (MAXIPIME) IV 2 g (11/17/21 0522)  ? famotidine (PEPCID) IV 20 mg (11/17/21 1024)  ? vancomycin    ? ?PRN Meds:.acetaminophen **OR** acetaminophen, albuterol, ALPRAZolam, bisacodyl, camphor-menthol, guaiFENesin-dextromethorphan, ondansetron **OR** ondansetron (ZOFRAN) IV, oxyCODONE, polyethylene glycol ?No Known Allergies ? ?OBJECTIVE: ?Blood pressure 131/88, pulse 91, temperature 98.3 ?F (36.8 ?C), temperature source Oral, resp. rate 17, height _0  (1.575 m), weight 78.2 kg, SpO2 99 %. ? ?Physical Exam ?Constitutional:   ?   Appearance: Normal appearance.  ?HENT:  ?   Head: Normocephalic and atraumatic.  ?   Right Ear: Tympanic membrane normal.  ?   Left Ear: Tympanic membrane normal.  ?   Nose: Nose normal.  ?   Mouth/Throat:  ?   Mouth: Mucous membranes are moist.  ?Eyes:  ?   Extraocular Movements: Extraocular movements intact.  ?   Conjunctiva/sclera: Conjunctivae normal.  ?   Pupils: Pupils are equal, round, and reactive to light.  ?Cardiovascular:  ?   Rate and Rhythm: Normal rate and regular rhythm.  ?   Heart sounds: No murmur heard. ?  No friction rub. No gallop.  ?Pulmonary:  ?   Effort: Pulmonary effort is normal.  ?   Breath sounds: Normal breath sounds.  ?Abdominal:  ?   General: Abdomen is flat.  ?   Palpations: Abdomen is soft.  ?Musculoskeletal:     ?   General: Normal range of motion.  ?Skin: ?   General: Skin is warm and dry.  ?   Comments: Fine rash on chest, papular rash b/l LE extremity ?Eschar on  right chest port  ?Neurological:  ?   General: No focal deficit present.  ?   Mental Status: She is alert and oriented to person, place, and time.  ?Psychiatric:     ?   Mood and Affect: Mood normal.  ? ? ?Lab Results ?Lab Results  ?Component Value Date  ? WBC 7.8 11/17/2021  ? HGB 10.3 (L) 11/17/2021  ? HCT 29.7 (L) 11/17/2021  ? MCV 102.4 (H) 11/17/2021  ? PLT 195 11/17/2021  ?  ?Lab Results  ?Component Value Date  ? CREATININE 0.56 11/17/2021  ?  BUN 11 11/17/2021  ? NA 140 11/17/2021  ? K 4.1 11/17/2021  ? CL 106 11/17/2021  ? CO2 24 11/17/2021  ?  ?Lab Results  ?Component Value Date  ? ALT 34 11/17/2021  ? AST 39 11/17/2021  ? ALKPHOS 56 11/17/2021  ? BILITOT 0.2 (L) 11/17/2021  ?  ? ? ? ?Laurice Record, MD ?[facility] for Infectious Disease ?[facility] ?11/17/2021, 12:29 PM  ?

## 2021-11-17 NOTE — Progress Notes (Signed)
[facility] consulted to obtain blood culture from implanted port. Spoke with ordering physician regarding best practice and not drawing blood cultures from central lines d/t the possibility of the port being colonized, but not source of infection. Physician requested blood culture be drawn from port and peripherally at same time.  ?Accessed patient's port, but was unable to obtain blood return despite troubleshooting. Notified physician and unit RN of inability to draw blood culture from implanted port. ?Plan is to remove port today in IR. ?

## 2021-11-17 NOTE — Telephone Encounter (Signed)
Received call from pt's husband. He states that Virginia [name] is scheduled to have her port removed today due to possible infection. Virginia [name] is asking if Dr. Lorenso Courier is ok with this. Advised that Dr. Lorenso Courier is out of the office but likely would agree to port removal if there is a suspicion of infection. Advised that we could give her next chemo treatment peripherally if the port has not been replaced in the next 2 weeks. Virginia [name] voiced understanding. ?

## 2021-11-17 NOTE — Progress Notes (Signed)
PROGRESS NOTE  Virginia [name] HRC:[id number] DOB: [birth year] DOA: 11/13/2021 PCP: Orson Slick, MD  HPI/Recap of past 24 hours: Virginia [name] is a 66 y.o. 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  Work-up revealed concern for port site infection.  RLE cellulitis, ruled out.  On 11/16/2021 patient reports painful port site, IR consulted, evaluated patient at bedside.  Due to concern for possible infection, plan for port removal by IR on 11/17/2021.  Infectious disease consulted and following.    11/17/2021: Seen and examined at her bedside.  Reports having some delirium last night, not knowing where she was, disorientation.  Today she is back to her baseline mentation.  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 -She has been afebrile in the hospital -She was empirically started on vancomycin and cefepime, DC'd on 11/14/2021. Due to concern for possible port access infection, MRSA screening test obtained and negative.  She was restarted on IV vancomycin and cefepime on 11/16/2021.  Blood cultures reordered by ID, to be collected at site of port and peripherally.  Possible Port-A-Cath infection, POA IR consulted, evaluated patient at bedside.  Due to concern for possible infection, planning for port removal by IR on 11/17/2021.  N.p.o. after midnight.    Followed by infectious disease.   Follow cultures for ID and sensitivities  Resolving rash, likely secondary to allergic reaction of unknown etiology -Rash seems more like a side effect of medication, she was started on scopolamine patch 2 days before appearance of rash -She also had chemotherapy on 11/09/2021 She received IV Solu-Medrol 40 mg twice daily, IV famotidine 20 mg twice daily, IV Benadryl 12.5 mg twice daily, all x1 day. Started on p.o. Benadryl, p.o. Allegra, and prednisone on 11/17/2021. EpiPen ordered for DC planning  Resolved leukopenia, suspect secondary to chemotherapy WBC 7.8 from 3.2.   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   -Follows with oncology Will need to follow-up with Dr. Lorenso Courier closely after discharge.   Resolved post repletion: Hypokalemia Serum potassium 4.7  Resolved hypovolemic hyponatremia Serum sodium 135 from 130.   Cancer associated pain Continue OxyContin every 12 hours and Oxy IR as needed.   Hypertension BP stable Continue to hold off home Norvasc for now to avoid hypotension.  Critical care time: 65 minutes.        Code Status: Full code  Family Communication: Updated her husband via phone.  Disposition Plan: Possible discharge to home on 11/18/2021 or when IR and ID sign off.  Consultants: IR ID  Procedures: Plan for Port-A-Cath removal by IR on 11/17/2021.  Antimicrobials: Cefadroxil 11/17/2021 IV vancomycin 11/16/2021  DVT prophylaxis: SQ Lovenox daily.  Status is: Inpatient Patient requires at least 2 midnights for further evaluation and treatment of  present condition.    Objective: Vitals:   11/17/21 1110 11/17/21 1115 11/17/21 1414 11/17/21 1414  BP: (!) 149/84 131/88 129/84 129/84  Pulse: 94 91 (!) 101 (!) 101  Resp: 18 17 16 16   Temp:   98.5 F (36.9 C)  98.5 F (36.9 C)  TempSrc:   Oral Oral  SpO2: 97% 99% 93% 93%  Weight:      Height:        Intake/Output Summary (Last 24 hours) at 11/17/2021 1456 Last data filed at 11/17/2021 1019 Gross per 24 hour  Intake 150 ml  Output 1100 ml  Net -950 ml   Filed Weights   11/13/21 1632 11/13/21 2225  Weight: 78.6 kg 78.2 kg    Exam:  General: 66 y.o. year-old female well-developed well-nourished in no acute stress.  She is alert oriented x3. Cardiovascular: Regular rate and rhythm no rubs or gallops. Respiratory: Clear to auscultation no wheezes or rales. Abdomen: Soft nontender normal bowel sounds present.   Musculoskeletal: Trace lower extremity edema bilaterally. Skin: Rash is improved. Psychiatry: Mood is appropriate for condition and setting. Neuro: Moves all 4 extremities.  Nonfocal exam.   Data Reviewed: CBC: Recent Labs  Lab 11/13/21 1550 11/14/21 0616 11/15/21 0601 11/16/21 0605 11/17/21 0526  WBC 7.1 4.8 3.2* 3.9* 7.8  NEUTROABS 6.3  --   --   --  4.7  HGB 12.2 10.1* 9.4* 8.9* 10.3*  HCT 34.0* 28.8* 26.8* 25.3* 29.7*  MCV 99.1 100.7* 101.1* 100.4* 102.4*  PLT 319 224 172 156 119   Basic Metabolic Panel: Recent Labs  Lab 11/13/21 1550 11/14/21 0616 11/15/21 0601 11/17/21 0526  NA 133* 130* 135 140  K 3.0* 3.6 4.7 4.1  CL 96* 95* 103 106  CO2 23 23 25 24   GLUCOSE 112* 107* 123* 165*  BUN 7* 7* 7* 11  CREATININE 0.78 0.69 0.56 0.56  CALCIUM 8.3* 7.7* 8.3* 8.4*  MG 1.6* 2.2  --  1.9  PHOS  --   --   --  3.9   GFR: Estimated Creatinine Clearance: 68.7 mL/min (by C-G formula based on SCr of 0.56 mg/dL). Liver Function Tests: Recent Labs  Lab 11/15/21 0601 11/17/21 0526  AST 32 39  ALT 26 34  ALKPHOS 55 56  BILITOT 0.4 0.2*  PROT 6.0* 6.5  ALBUMIN 2.5* 2.8*   No results for input(s): LIPASE, AMYLASE in the last 168 hours. No results for input(s): AMMONIA in the last 168 hours. Coagulation Profile: No results for input(s): INR, PROTIME in the  last 168 hours. Cardiac Enzymes: No results for input(s): CKTOTAL, CKMB, CKMBINDEX, TROPONINI in the last 168 hours. BNP (last 3 results) No results for input(s): PROBNP in the last 8760 hours. HbA1C: No results for input(s): HGBA1C in the last 72 hours. CBG: No results for input(s): GLUCAP in the last 168 hours. Lipid Profile: No results for input(s): CHOL, HDL, LDLCALC, TRIG, CHOLHDL, LDLDIRECT in the last 72 hours. Thyroid Function Tests: No results for input(s): TSH, T4TOTAL, FREET4, T3FREE, THYROIDAB in the last 72 hours. Anemia Panel: No results for input(s): VITAMINB12, FOLATE, FERRITIN, TIBC, IRON, RETICCTPCT in the last 72 hours. Urine analysis:    Component Value Date/Time   COLORURINE STRAW (A) 09/28/2021 1032   APPEARANCEUR CLEAR 09/28/2021 1032   LABSPEC 1.006 09/28/2021 1032   PHURINE 7.0 09/28/2021 1032   GLUCOSEU NEGATIVE 09/28/2021 1032   HGBUR NEGATIVE 09/28/2021 1032   BILIRUBINUR NEGATIVE 09/28/2021 1032   BILIRUBINUR NEG 06/12/2019 1639  KETONESUR NEGATIVE 09/28/2021 1032   PROTEINUR NEGATIVE 10/20/2021 1115   UROBILINOGEN 0.2 06/12/2019 1639   UROBILINOGEN 0.2 04/01/2014 1006   NITRITE NEGATIVE 09/28/2021 1032   LEUKOCYTESUR NEGATIVE 09/28/2021 1032   Sepsis Labs: @LABRCNTIP (procalcitonin:4,lacticidven:4)  ) Recent Results (from the past 240 hour(s))  Resp Panel by RT-PCR (Flu A&B, Covid) Nasopharyngeal Swab     Status: None   Collection Time: 11/13/21  3:23 PM   Specimen: Nasopharyngeal Swab; Nasopharyngeal(NP) swabs in vial transport medium  Result Value Ref Range Status   SARS Coronavirus 2 by RT PCR NEGATIVE NEGATIVE Final    Comment: (NOTE) SARS-CoV-2 target nucleic acids are NOT DETECTED.  The SARS-CoV-2 RNA is generally detectable in upper respiratory specimens during the acute phase of infection. The lowest concentration of SARS-CoV-2 viral copies this assay can detect is 138 copies/mL. A negative result does not preclude  SARS-Cov-2 infection and should not be used as the sole basis for treatment or other patient management decisions. A negative result may occur with  improper specimen collection/handling, submission of specimen other than nasopharyngeal swab, presence of viral mutation(s) within the areas targeted by this assay, and inadequate number of viral copies(<138 copies/mL). A negative result must be combined with clinical observations, patient history, and epidemiological information. The expected result is Negative.  Fact Sheet for Patients:  EntrepreneurPulse.com.au  Fact Sheet for Healthcare Providers:  IncredibleEmployment.be  This test is no t yet approved or cleared by the [place] FDA and  has been authorized for detection and/or diagnosis of SARS-CoV-2 by FDA under an Emergency Use Authorization (EUA). This EUA will remain  in effect (meaning this test can be used) for the duration of the COVID-19 declaration under Section 564(b)(1) of the Act, 21 U.S.C.section 360bbb-3(b)(1), unless the authorization is terminated  or revoked sooner.       Influenza A by PCR NEGATIVE NEGATIVE Final   Influenza B by PCR NEGATIVE NEGATIVE Final    Comment: (NOTE) The Xpert Xpress SARS-CoV-2/FLU/RSV plus assay is intended as an aid in the diagnosis of influenza from Nasopharyngeal swab specimens and should not be used as a sole basis for treatment. Nasal washings and aspirates are unacceptable for Xpert Xpress SARS-CoV-2/FLU/RSV testing.  Fact Sheet for Patients: EntrepreneurPulse.com.au  Fact Sheet for Healthcare Providers: IncredibleEmployment.be  This test is not yet approved or cleared by the [place] FDA and has been authorized for detection and/or diagnosis of SARS-CoV-2 by FDA under an Emergency Use Authorization (EUA). This EUA will remain in effect (meaning this test can be used) for the duration of  the COVID-19 declaration under Section 564(b)(1) of the Act, 21 U.S.C. section 360bbb-3(b)(1), unless the authorization is terminated or revoked.  Performed at [facility], [place] [address], [place], [place] [address]   Blood culture (routine x 2)     Status: None (Preliminary result)   Collection Time: 11/13/21  3:45 PM   Specimen: BLOOD  Result Value Ref Range Status   Specimen Description   Final    BLOOD RIGHT ANTECUBITAL Performed at [place] [address], [place], [place] [address]    Special Requests   Final    BOTTLES DRAWN AEROBIC AND ANAEROBIC Blood Culture adequate volume Performed at [place] [address], [place], [place] [address]    Culture   Final    NO GROWTH 4 DAYS Performed at [facility], [place] [address] [place], [place] [address]    Report Status PENDING  Incomplete  Blood culture (routine x 2)     Status: None (Preliminary result)   Collection Time: 11/13/21 10:56 PM   Specimen: BLOOD LEFT HAND  Result Value Ref Range Status   Specimen Description   Final    BLOOD LEFT HAND Performed at [place] [address], [place], [place] [address]    Special Requests   Final    BOTTLES DRAWN AEROBIC ONLY Blood Culture adequate volume Performed at [place] [address] [place], [place] [address]    Culture   Final    NO GROWTH 3 DAYS Performed at [facility], [place] [address] [place], [place] [address]    Report Status PENDING  Incomplete  MRSA Next Gen by PCR, Nasal     Status: None   Collection Time: 11/16/21  4:52 PM   Specimen: Nasal Mucosa; Nasal Swab  Result Value Ref Range Status   MRSA by PCR Next Gen NOT DETECTED NOT DETECTED Final    Comment: (NOTE) The GeneXpert MRSA Assay (FDA approved for NASAL specimens only), is one component of a comprehensive MRSA colonization surveillance program. It is not intended to  diagnose MRSA infection nor to guide or monitor treatment for MRSA infections. Test performance is not FDA approved in patients less than 13 years old. Performed at [facility], [place] [address], [place],  [address]       Studies: IR REMOVAL TUN ACCESS W/ PORT W/O FL MOD SED  Result Date: 11/17/2021 CLINICAL DATA:  Status post Port-A-Cath placement on 10/23/2019 to treat lung carcinoma. Recently the patient has developed significant chest wall cellulitis surrounding the subcutaneous port as well as a draining wound overlying the port reservoir where access needles have been placed. Due to the significant cellulitis and evidence bacteremia, port removal has been recommended. EXAM: REMOVAL OF IMPLANTED TUNNELED PORT-A-CATH MEDICATIONS: Moderate (conscious) sedation was employed during this procedure. A total of Versed 2.0 mg and Fentanyl 50 mcg was administered intravenously by radiology nursing. Moderate Sedation Time: 16 minutes. The patient's level of consciousness and vital signs were monitored continuously by radiology nursing throughout the procedure under my direct supervision. PROCEDURE: The right chest Port-A-Cath site was prepped with chlorhexidine. A sterile gown and gloves were worn during the procedure. Local anesthesia was provided with 1% lidocaine. An incision was made overlying the Port-A-Cath with a #15 scalpel. Utilizing sharp and blunt dissection, the Port-A-Cath was removed. Portable cautery was utilized. Retention sutures were removed. The pocked was irrigated with sterile saline. Quarter inch iodoform gauze was then packed into the wound and a dressing applied over the wound. The entire Port-A-Cath was removed successfully. FINDINGS: No gross purulence was present in the port pocket. However, the small rounded area of ulceration overlying the port site goes all the way through to the level of the port pocket after port removal and therefore the pocket is  consider contaminated and cannot be closed. Iodoform gauze was placed initially into the pocket and will be removed at a later time to allow the pocket to heal by secondary intention. IMPRESSION: Removal of implanted Port-A-Cath utilizing sharp and blunt dissection. Due to full thickness ulceration overlying the port reservoir, the pocket was packed with iodoform gauze. The wound will be allowed to heal by secondary intention. Electronically Signed   By: Aletta Edouard M.D.   On: 11/17/2021 13:55    Scheduled Meds:  albuterol  2.5 mg Nebulization BID   cefadroxil  1,000  mg Oral BID   diphenhydrAMINE  50 mg Oral Daily   enoxaparin (LOVENOX) injection  40 mg Subcutaneous Q24H   feeding supplement  237 mL Oral BID BM   folic acid  1 mg Oral Daily   lidocaine-EPINEPHrine       loratadine  10 mg Oral Daily   multivitamin with minerals  1 tablet Oral Daily   OLANZapine  2.5 mg Oral QHS   oxyCODONE  15 mg Oral Q12H   potassium chloride  20 mEq Oral BID   predniSONE  40 mg Oral Q breakfast   senna-docusate  2 tablet Oral BID    Continuous Infusions:  famotidine (PEPCID) IV 20 mg (11/17/21 1024)   vancomycin       LOS: 3 days     Kayleen Memos, MD [facility] Pager [phone]  If 7PM-7AM, please contact night-coverage [email or web address] Password [facility] 11/17/2021, 2:56 PM

## 2021-11-17 NOTE — Progress Notes (Signed)
PHARMACY CONSULT NOTE FOR: ? ?OUTPATIENT  PARENTERAL ANTIBIOTIC THERAPY (OPAT) ? ?Indication: PAC Infection ?Regimen:  Daptomycin 650 mg IV every 24 hours + Cipro 500 mg po bid  ?End date: 12/01/21 ? ?IV antibiotic discharge orders are pended. ?To discharging provider:  please sign these orders via discharge navigator,  ?Select New Orders & click on the button choice - Manage This Unsigned Work.  ?  ? ?Thank you for allowing pharmacy to be a part of this patient?s care. ? ?Alycia Rossetti, PharmD, BCPS ?Infectious Diseases Clinical Pharmacist ?11/17/2021 4:41 PM  ? ?**Pharmacist phone directory can now be found on [email or web address] ([email or web address]).  Listed under [place].  ?

## 2021-11-17 NOTE — Progress Notes (Signed)
Peripherally Inserted Central Catheter Placement ? ?The IV Nurse has discussed with the patient and/or persons authorized to consent for the patient, the purpose of this procedure and the potential benefits and risks involved with this procedure.  The benefits include less needle sticks, lab draws from the catheter, and the patient may be discharged home with the catheter. Risks include, but not limited to, infection, bleeding, blood clot (thrombus formation), and puncture of an artery; nerve damage and irregular heartbeat and possibility to perform a PICC exchange if needed/ordered by physician.  Alternatives to this procedure were also discussed.  Bard Power PICC patient education guide, fact sheet on infection prevention and patient information card has been provided to patient /or left at bedside.   ? ?PICC Placement Documentation  ?PICC Single Lumen 49/49/44 Right Basilic 39 cm 0 cm (Active)  ?Indication for Insertion or Continuance of Line Home intravenous therapies (PICC only) 11/17/21 1900  ?Exposed Catheter (cm) 0 cm 11/17/21 1900  ?Site Assessment Clean, Dry, Intact 11/17/21 1900  ?Line Status Flushed;Blood return noted 11/17/21 1900  ?Dressing Type Securing device;Transparent 11/17/21 1900  ?Dressing Status Antimicrobial disc in place;Clean, Dry, Intact 11/17/21 1900  ?Dressing Change Due 11/24/21 11/17/21 1900  ? ? ? ? ? ?Jule Economy Horton ?11/17/2021, 7:11 PM ? ?

## 2021-11-18 ENCOUNTER — Other Ambulatory Visit (HOSPITAL_COMMUNITY): Payer: Self-pay

## 2021-11-18 LAB — CULTURE, BLOOD (ROUTINE X 2)
Culture: NO GROWTH
Special Requests: ADEQUATE

## 2021-11-18 LAB — CK: Total CK: 24 U/L — ABNORMAL LOW (ref 38–234)

## 2021-11-18 MED ORDER — CIPROFLOXACIN HCL 500 MG PO TABS
500.0000 mg | ORAL_TABLET | Freq: Two times a day (BID) | ORAL | 0 refills | Status: DC
  Filled 2021-11-18: qty 28, 14d supply, fill #0

## 2021-11-18 MED ORDER — DAPTOMYCIN IV (FOR PTA / DISCHARGE USE ONLY): 650.0000 mg | INTRAVENOUS | 0 refills | Status: AC

## 2021-11-18 MED ORDER — CIPROFLOXACIN HCL 500 MG PO TABS: 500.0000 mg | ORAL_TABLET | Freq: Two times a day (BID) | ORAL | 0 refills | Status: AC

## 2021-11-18 MED ORDER — HEPARIN SOD (PORK) LOCK FLUSH 100 UNIT/ML IV SOLN
500.0000 [IU] | Freq: Once | INTRAVENOUS | Status: AC
  Administered 2021-11-18: 500 [IU] via INTRAVENOUS
  Filled 2021-11-18: qty 5

## 2021-11-18 NOTE — Progress Notes (Signed)
Reviewed discharge paperwork with patient and husband. Went over follow up appointments, medication regimen and new medications. Advised of where to pick up prescriptions per AVS. Gave printed prescription for IV antibiotics. Patient escorted in wheelchair by tech.

## 2021-11-18 NOTE — Discharge Summary (Signed)
Discharge Summary  Virginia [name]:[id number] DOB: 07/06/1956  PCP: Orson Slick, MD  Admit date: 11/13/2021 Discharge date: 11/18/2021  Time spent: 35 minutes.  Recommendations for Outpatient Follow-up:  Follow-up with infectious disease in 1 to 2 weeks. Follow-up with your primary care provider in 1 to 2 weeks. Follow-up with hematology oncology in 1 to 2 weeks. Take your medications as prescribed.  Discharge Diagnoses:  Active Hospital Problems   Diagnosis Date Noted   Febrile illness 11/13/2021    Priority: 1.   Skin rash 11/13/2021    Priority: 1.   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   Brain metastases ([place]) 10/15/2019    Priority: 2.   Hypokalemia 11/13/2021    Priority: 3.   Cancer associated pain 11/14/2021   Rash 11/14/2021   Hyponatremia 11/14/2021   Port-A-Cath in place 12/15/2020   Hypertension 01/11/2012    Resolved Hospital Problems  No resolved problems to display.    Discharge Condition: Stable.  Diet recommendation: Resume previous diet.  Vitals:   11/17/21 2126 11/18/21 0428  BP:  (!) 162/98  Pulse:  83  Resp:  18  Temp:  (!) 97.4 F (36.3 C)  SpO2: 97% 96%    History of present illness:   Virginia [name] is a 66 y.o. 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  Work-up revealed concern for Port-A-Cath infection.  RLE cellulitis was ruled out.  On 11/16/2021 patient reports painful port site, IR consulted.  Due to concern for possible infection, right chest Port-A-Cath removed by IR on 11/17/2021.  Seen by infectious disease, recommended 2 weeks of IV antibiotics from port removal with IV vancomycin and ciprofloxacin.  Transition  to IV daptomycin at discharge.  Patient with follow-up with infectious disease outpatient.     11/18/2021: Seen and examined at bedside.  Port-A-Cath removed on 11/17/2021 by IR.  PICC line placed for IV antibiotics.  Bilateral lower extremity rash is improving.  The patient is eager to go home.   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  Chest x-ray benign. Concern for port access infection, right chest Port-A-Cath removed on 11/17/2021 by IR.   Infectious disease consulted, provided recommendations, will follow-up with infectious disease outpatient. Blood cultures obtained on 11/13/2021 negative to date. Blood cultures repeated on 11/17/2021, no growth to date. No recurrent fever while hospitalized.  Afebrile with no leukocytosis. Continue IV antibiotics as recommended by infectious disease.   Port-A-Cath infection, POA IR consulted, Port-A-Cath removed on 11/17/2021 by IR.   ID recommended IV antibiotics at discharge.   Currently on IV daptomycin and p.o. ciprofloxacin Follow repeated blood cultures taken on 11/17/2021. Blood cultures from 11/13/2021 negative to date. PICC line placed on 11/17/2021.  Plan for IV antibiotics outpatient x2 weeks. Appreciate TOC's assistance with home health services arrangement   Resolving rash, likely secondary to allergic reaction of unknown etiology -Rash seems  more like a side effect of medication, she was started on scopolamine patch 2 days before appearance of rash -She also had chemotherapy on 11/09/2021 She received IV Solu-Medrol 40 mg twice daily, IV famotidine 20 mg twice daily, IV Benadryl 12.5 mg twice daily, all x1 day. Started on p.o. Benadryl, p.o.  Allegra, and prednisone on 11/17/2021. EpiPen ordered for DC planning Follow-up with your primary care provider.   Resolved leukopenia, suspect secondary to chemotherapy WBC 7.8 from 3.2.   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   -Follow-up with Dr. Lorenso Courier closely after discharge.   Resolved post repletion: Hypokalemia Serum potassium 4.7   Resolved hypovolemic hyponatremia Serum sodium 135 from 130.   Cancer associated pain Continue OxyContin every 12 hours and Oxy IR as needed. Follow-up with your primary care provider.   Hypertension Resume home regimen.         Code Status: Full code    Procedures: Plan for Port-A-Cath removal by IR on 11/17/2021.   Antimicrobials: Ciprofloxacin 11/17/2021 IV vancomycin 11/16/2021, IV daptomycin 11/17/2021    Procedures: Removal by IR on 11/18/2018.  Consultations: IR ID  Discharge Exam: BP (!) 162/98 (BP Location: Left Arm)    Pulse 83    Temp (!) 97.4 F (36.3 C) (Oral)    Resp 18    Ht _0  (1.575 m)    Wt 78.2 kg    SpO2 96%    BMI 31.53 kg/m  General: 66 y.o. year-old female well developed well nourished in no acute distress.  Alert and oriented x3. Cardiovascular: Regular rate and rhythm with no rubs or gallops.  No thyromegaly or JVD noted.   Respiratory: Clear to auscultation with no wheezes or rales. Good inspiratory effort. Abdomen: Soft nontender nondistended with normal bowel sounds x4 quadrants. Musculoskeletal: Trace lower extremity edema bilaterally. Skin: Bilateral upper and lower extremity rash improving. Psychiatry: Mood is appropriate for condition and setting.  Discharge Instructions You were cared for by a hospitalist during your hospital stay. If you have any questions about your discharge medications or the care you received while you were in the hospital  after you are discharged, you can call the unit and asked to speak with the hospitalist on call if the hospitalist that took care of you is not available. Once you are discharged, your primary care physician will handle any further medical issues. Please note that NO REFILLS for any discharge medications will be authorized once you are discharged, as it is imperative that you return to your primary care physician (or establish a relationship with a primary care physician if you do not have one) for your aftercare needs so that they can reassess your need for medications and monitor your lab values.  Discharge Instructions     [facility] pharmacist to adjust dose for Vancomycin, Aminoglycosides and other anti-infective therapies as requested by physician.   Complete by: As directed    [facility] to provide Cath Flo 48m   Complete by: As directed    Administer for PICC line occlusion and as ordered by physician for other access device issues.   Anaphylaxis Kit: Provided to treat any anaphylactic reaction to the medication being provided to the patient if First Dose or when requested by physician   Complete by: As directed    Epinephrine 115mml vial / amp: Administer 0.79m79m0.79ml46mubcutaneously  once for moderate to severe anaphylaxis, nurse to call physician and pharmacy when reaction occurs and call 911 if needed for immediate care   Diphenhydramine 7m/ml IV vial: Administer 25-551mIV/IM PRN for first dose reaction, rash, itching, mild reaction, nurse to call physician and pharmacy when reaction occurs   Sodium Chloride 0.9% NS 50040mV: Administer if needed for hypovolemic blood pressure drop or as ordered by physician after call to physician with anaphylactic reaction   Change dressing on IV access line weekly and PRN   Complete by: As directed    Flush IV access with Sodium Chloride 0.9% and Heparin 10 units/ml or 100 units/ml   Complete by: As directed    Home infusion  instructions - [facility]   Complete by: As directed    Instructions: Flush IV access with Sodium Chloride 0.9% and Heparin 10units/ml or 100units/ml   Change dressing on IV access line: Weekly and PRN   Instructions Cath Flo 2mg37mdminister for PICC Line occlusion and as ordered by physician for other access device   [facility] pharmacist to adjust dose for: Vancomycin, Aminoglycosides and other anti-infective therapies as requested by physician   Method of administration may be changed at the discretion of home infusion pharmacist based upon assessment of the patient and/or caregivers ability to self-administer the medication ordered   Complete by: As directed       Allergies as of 11/18/2021   No Known Allergies      Medication List     STOP taking these medications    amLODipine 10 MG tablet Commonly known as: NORVASC   cyclobenzaprine 5 MG tablet Commonly known as: FLEXERIL   dexamethasone 1 MG tablet Commonly known as: DECADRON   pentoxifylline 400 MG CR tablet Commonly known as: TRENTAL   promethazine 12.5 MG tablet Commonly known as: PHENERGAN   scopolamine 1 MG/3DAYS Commonly known as: TRANSDERM-SCOP       TAKE these medications    acetaminophen 500 MG tablet Commonly known as: TYLENOL Take 1,000 mg by mouth every 8 (eight) hours as needed for moderate pain.   AIRBORNE PO Take 1 tablet by mouth daily.   albuterol 108 (90 Base) MCG/ACT inhaler Commonly known as: VENTOLIN HFA INHALE 2 PUFFS INTO THE LUNGS EVERY 6 HOURS AS NEEDED FOR WHEEZING OR SHORTNESS OF BREATH   ALPRAZolam 0.25 MG tablet Commonly known as: XANAX Take 1 tablet (0.25 mg total) by mouth 2 (two) times daily as needed for anxiety   ciprofloxacin 500 MG tablet Commonly known as: CIPRO Take 1 tablet (500 mg total) by mouth 2 (two) times daily for 14 days. End date 3/24   daptomycin  IVPB Commonly known as: CUBICIN Inject 650 mg into the vein daily for 14  days. Indication:  PAC infection First Dose: Yes Last Day of Therapy:  12/01/21 Labs - Once weekly:  CBC/D, BMP, and CPK Labs - Every other week:  ESR and CRP Method of administration: IV Push Method of administration may be changed at the discretion of home infusion pharmacist based upon assessment of the patient and/or caregiver's ability to self-administer the medication ordered.   diphenhydrAMINE 50 MG tablet Commonly known as: BENADRYL Take 1 tablet by mouth at bedtime for 8 days.   EPINEPHrine 0.3 mg/0.3 mL Soaj injection Commonly known as: EPI-PEN Inject 0.3 mg into the muscle as needed for anaphylaxis.   fexofenadine 180 MG tablet Commonly known as: Allegra Allergy Take 1 tablet by mouth daily for 8  days.   folic acid 1 MG tablet Commonly known as: FOLVITE Take 1 tablet by mouth daily.   furosemide 20 MG tablet Commonly known as: LASIX Take 2 tablets (40 mg total) by mouth 2 times daily. What changed: how much to take   lidocaine-prilocaine cream Commonly known as: EMLA Apply to port as needed. What changed: reasons to take this   meclizine 25 MG tablet Commonly known as: ANTIVERT Take 1 tablet (25 mg total) by mouth 3 (three) times daily as needed for dizziness.   ofloxacin 0.3 % ophthalmic solution Commonly known as: Ocuflox 2 drops each eye 4 times a day for 5 days What changed:  how much to take how to take this when to take this reasons to take this additional instructions   OLANZapine 2.5 MG tablet Commonly known as: ZYPREXA TAKE 1 TABLET BY MOUTH EVERY NIGHT AT BEDTIME   ondansetron 4 MG tablet Commonly known as: ZOFRAN TAKE 1 TO 2 TABLETS BY MOUTH 2 TIMES DAILY AS NEEDED What changed:  how much to take how to take this when to take this reasons to take this   oxyCODONE 5 MG immediate release tablet Commonly known as: Oxy IR/ROXICODONE TAKE 1 TO 2 TABLETS BY MOUTH EVERY 4 HOURS AS NEEDED FOR SEVERE PAIN   pantoprazole 40 MG  tablet Commonly known as: PROTONIX TAKE 1 TABLET BY MOUTH ONCE DAILY   polyethylene glycol 17 g packet Commonly known as: MIRALAX / GLYCOLAX Take 17 g by mouth every evening.   potassium chloride SA 20 MEQ tablet Commonly known as: KLOR-CON M Take 2 tablets in the morning and 1 tablet in the evening. What changed:  how much to take when to take this additional instructions   predniSONE 10 MG tablet Commonly known as: DELTASONE Take 4 tablets by mouth daily for 2 days then decrease by 1 tablet every other day (4-4-3-3-2-2-1-1)   VITAMIN B12 PO Take 1 tablet by mouth daily. Gummies   Vitamin D3 25 MCG tablet Commonly known as: Vitamin D Take 2,000 Units by mouth every evening.   vitamin E 180 MG (400 UNITS) capsule Take 400 IU daily x 1 week, then 400 IU BID What changed:  how much to take how to take this when to take this additional instructions   Xtampza ER 13.5 MG C12a Generic drug: oxyCODONE ER Take 1 capsule by mouth in the morning and at bedtime. (every 12 hours)               Discharge Care Instructions  (From admission, onward)           Start     Ordered   11/18/21 0000  Change dressing on IV access line weekly and PRN  (Home infusion instructions - [facility] )        11/18/21 1043           No Known Allergies  Follow-up Information     Orson Slick, MD. Call today.   Specialty: Hematology and Oncology Why: Please call for a posthospital follow-up appointment. Contact information: [address] [address] [phone]         Laurice Record, MD. Call today.   Specialty: Infectious Diseases Why: Please call for a posthospital follow-up appointment. Contact information: [address], [place]  [address] [phone]                  The results of significant diagnostics from this  hospitalization (including imaging, microbiology, ancillary and laboratory) are listed  below for reference.    Significant Diagnostic Studies: MR BRAIN W WO CONTRAST  Result Date: 10/27/2021 CLINICAL DATA:  Brain/CNS neoplasm. Assess treatment response. Radiation and chemotherapy. Metastatic lung cancer. EXAM: MRI HEAD WITHOUT AND WITH CONTRAST TECHNIQUE: Multiplanar, multiecho pulse sequences of the brain and surrounding structures were obtained without and with intravenous contrast. CONTRAST:  77m MULTIHANCE GADOBENATE DIMEGLUMINE 529 MG/ML IV SOLN COMPARISON:  08/24/2021.  05/25/2021. FINDINGS: Brain: Marked favorable changes since the study of December 15. Numerous foci of abnormal enhancement throughout the cerebellum show marked reduction in contrast enhancement. The single exception to this is a rounded focus in the posterior right cerebellum on axial image 31 which remains visible as a round focus, but is perhaps a mm smaller, measuring 3 mm today. Restricted diffusion seen within the left middle cerebellar peduncle appears similar. No worsening or progressive finding in the posterior fossa. In the supratentorial brain, there are similarly favorable findings without any new or worsening findings. Foci of enhancement previously seen in the right occipital lobe axial image 57, the right parietal lobe axial image 90 for and the left parietal lobe axial image 117 oral diminishing and less conspicuous. No new or progressive supra tentorial lesion. No hydrocephalus.  No extra-axial collection. Vascular: Major vessels at the base of the brain show flow. Skull and upper cervical spine: Otherwise negative Sinuses/Orbits: Clear/normal Other: None IMPRESSION: Favorable interval changes. Multiple foci of abnormal enhancement seen throughout the cerebellum are becoming less conspicuous with marked reduction in enhancement. The single exception to this is a 3 mm rounded focus in the posterior cerebellum on the right axial image 30/31, which does remain visible, though it may be a mm smaller. Scattered  enhancing foci within the cerebral hemispheres are similarly diminishing. These are marked with arrows on today's study and correlated with the foci seen 08/24/2021. No supratentorial new or progressive lesion. Electronically Signed   By: MNelson ChimesM.D.   On: 10/27/2021 23:14   IR REMOVAL TUN ACCESS W/ PORT W/O FL MOD SED  Result Date: 11/17/2021 CLINICAL DATA:  Status post Port-A-Cath placement on 10/23/2019 to treat lung carcinoma. Recently the patient has developed significant chest wall cellulitis surrounding the subcutaneous port as well as a draining wound overlying the port reservoir where access needles have been placed. Due to the significant cellulitis and evidence bacteremia, port removal has been recommended. EXAM: REMOVAL OF IMPLANTED TUNNELED PORT-A-CATH MEDICATIONS: Moderate (conscious) sedation was employed during this procedure. A total of Versed 2.0 mg and Fentanyl 50 mcg was administered intravenously by radiology nursing. Moderate Sedation Time: 16 minutes. The patient's level of consciousness and vital signs were monitored continuously by radiology nursing throughout the procedure under my direct supervision. PROCEDURE: The right chest Port-A-Cath site was prepped with chlorhexidine. A sterile gown and gloves were worn during the procedure. Local anesthesia was provided with 1% lidocaine. An incision was made overlying the Port-A-Cath with a #15 scalpel. Utilizing sharp and blunt dissection, the Port-A-Cath was removed. Portable cautery was utilized. Retention sutures were removed. The pocked was irrigated with sterile saline. Quarter inch iodoform gauze was then packed into the wound and a dressing applied over the wound. The entire Port-A-Cath was removed successfully. FINDINGS: No gross purulence was present in the port pocket. However, the small rounded area of ulceration overlying the port site goes all the way through to the level of the port pocket after port removal and therefore  the pocket  is consider contaminated and cannot be closed. Iodoform gauze was placed initially into the pocket and will be removed at a later time to allow the pocket to heal by secondary intention. IMPRESSION: Removal of implanted Port-A-Cath utilizing sharp and blunt dissection. Due to full thickness ulceration overlying the port reservoir, the pocket was packed with iodoform gauze. The wound will be allowed to heal by secondary intention. Electronically Signed   By: Aletta Edouard M.D.   On: 11/17/2021 13:55   DG Chest Port 1 View  Result Date: 11/13/2021 CLINICAL DATA:  Cough EXAM: PORTABLE CHEST 1 VIEW COMPARISON:  Chest x-ray 12/17/2019 FINDINGS: Cardiomediastinal silhouette is stable and within normal limits. Mild calcified plaques in the aortic arch. Right-sided central venous port with the tip in the SVC. No focal consolidation identified in the lungs. No pleural effusion or pneumothorax. IMPRESSION: No acute intrathoracic process identified. Electronically Signed   By: Ofilia Neas M.D.   On: 11/13/2021 17:17   ECHOCARDIOGRAM COMPLETE  Result Date: 11/17/2021    ECHOCARDIOGRAM REPORT   Patient Name:   Virginia [name] Date of Exam: 11/17/2021 Medical Rec #:  [id number]         Height:       62.0 in Accession #:    [id number]        Weight:       172.4 lb Date of Birth:  01/01/1956        BSA:          1.795 m Patient Age:    55 years          BP:           129/84 mmHg Patient Gender: F                 HR:           88 bpm. Exam Location:  Inpatient Procedure: Cardiac Doppler, Strain Analysis, Limited Echo and Limited Color            Doppler Indications:    Endocarditis  History:        Patient has no prior history of Echocardiogram examinations.                 COPD, Signs/Symptoms:Dyspnea; Risk Factors:Hypertension and                 Dyslipidemia.  Sonographer:    [place] Referring Phys: [id number] [facility] [place]  1. Left ventricular ejection fraction, by estimation, is  50%. The left ventricle has low normal function. Left ventricular endocardial border not optimally defined to evaluate regional wall motion. There is severe asymmetric left ventricular hypertrophy  of the basal-septal segment. Left ventricular diastolic parameters are indeterminate.  2. Right ventricular systolic function was not well visualized. The right ventricular size is not well visualized. There is normal pulmonary artery systolic pressure.  3. The mitral valve was not well visualized. No evidence of mitral valve regurgitation.  4. The aortic valve was not well visualized. Aortic valve regurgitation is not visualized. No aortic stenosis is present.  5. Aortic dilatation noted. There is mild dilatation of the ascending aorta, measuring 40 mm.  6. The inferior vena cava is normal in size with greater than 50% respiratory variability, suggesting right atrial pressure of 3 mmHg. Comparison(s): No prior Echocardiogram. FINDINGS  Left Ventricle: Left ventricular ejection fraction, by estimation, is 50%. The left ventricle has low normal function. Left ventricular endocardial border not optimally defined to evaluate regional  wall motion. Global longitudinal strain performed but not reported based on interpreter judgement due to suboptimal tracking. The left ventricular internal cavity size was normal in size. There is severe asymmetric left ventricular hypertrophy of the basal-septal segment. Left ventricular diastolic parameters are indeterminate. Right Ventricle: The right ventricular size is not well visualized. Right vetricular wall thickness was not well visualized. Right ventricular systolic function was not well visualized. There is normal pulmonary artery systolic pressure. The tricuspid regurgitant velocity is 1.43 m/s, and with an assumed right atrial pressure of 3 mmHg, the estimated right ventricular systolic pressure is 40.9 mmHg. Left Atrium: Left atrial size was normal in size. Right Atrium: Right  atrial size was not well visualized. Pericardium: There is no evidence of pericardial effusion. Mitral Valve: The mitral valve was not well visualized. No evidence of mitral valve regurgitation. MV peak gradient, 5.8 mmHg. The mean mitral valve gradient is 3.0 mmHg. Tricuspid Valve: The tricuspid valve is not well visualized. Tricuspid valve regurgitation is mild . No evidence of tricuspid stenosis. Aortic Valve: The aortic valve was not well visualized. Aortic valve regurgitation is not visualized. No aortic stenosis is present. Aortic valve mean gradient measures 3.0 mmHg. Aortic valve peak gradient measures 5.4 mmHg. Aortic valve area, by VTI measures 3.07 cm. Pulmonic Valve: The pulmonic valve was grossly normal. Pulmonic valve regurgitation is not visualized. No evidence of pulmonic stenosis. Aorta: Aortic dilatation noted. There is mild dilatation of the ascending aorta, measuring 40 mm. Venous: The inferior vena cava is normal in size with greater than 50% respiratory variability, suggesting right atrial pressure of 3 mmHg. IAS/Shunts: No atrial level shunt detected by color flow Doppler.  LEFT VENTRICLE PLAX 2D LVIDd:         4.70 cm     Diastology LVIDs:         2.60 cm     LV e' medial:    4.46 cm/s LV PW:         1.30 cm     LV E/e' medial:  16.2 LV IVS:        1.50 cm     LV e' lateral:   5.22 cm/s LVOT diam:     2.20 cm     LV E/e' lateral: 13.8 LV SV:         59 LV SV Index:   33 LVOT Area:     3.80 cm  LV Volumes (MOD) LV vol d, MOD A2C: 63.1 ml LV vol d, MOD A4C: 83.6 ml LV vol s, MOD A2C: 27.2 ml LV vol s, MOD A4C: 52.2 ml LV SV MOD A2C:     35.9 ml LV SV MOD A4C:     83.6 ml LV SV MOD BP:      35.2 ml LEFT ATRIUM             Index LA diam:        2.40 cm 1.34 cm/m LA Vol (A2C):   19.8 ml 11.03 ml/m LA Vol (A4C):   22.7 ml 12.65 ml/m LA Biplane Vol: 22.1 ml 12.31 ml/m  AORTIC VALVE                    PULMONIC VALVE AV Area (Vmax):    3.41 cm     PV Vmax:       0.90 m/s AV Area (Vmean):   3.31  cm     PV Vmean:      62.800 cm/s AV Area (VTI):  3.07 cm     PV VTI:        0.155 m AV Vmax:           116.00 cm/s  PV Peak grad:  3.2 mmHg AV Vmean:          76.000 cm/s  PV Mean grad:  2.0 mmHg AV VTI:            0.192 m AV Peak Grad:      5.4 mmHg AV Mean Grad:      3.0 mmHg LVOT Vmax:         104.00 cm/s LVOT Vmean:        66.200 cm/s LVOT VTI:          0.155 m LVOT/AV VTI ratio: 0.81  AORTA Ao Root diam: 3.10 cm Ao Asc diam:  3.90 cm MITRAL VALVE                TRICUSPID VALVE MV Area (PHT): 10.25 cm    TR Peak grad:   8.2 mmHg MV Area VTI:   2.83 cm     TR Vmax:        143.00 cm/s MV Peak grad:  5.8 mmHg MV Mean grad:  3.0 mmHg     SHUNTS MV Vmax:       1.20 m/s     Systemic VTI:  0.16 m MV Vmean:      79.1 cm/s    Systemic Diam: 2.20 cm MV Decel Time: 74 msec MV E velocity: 72.20 cm/s MV A velocity: 106.00 cm/s MV E/A ratio:  0.68 Rudean Haskell MD Electronically signed by Rudean Haskell MD Signature Date/Time: 11/17/2021/5:26:59 PM    Final    VAS [name] LOWER EXTREMITY VENOUS (DVT) (7a-7p)  Result Date: 11/13/2021  Lower Venous DVT Study Patient Name:  Virginia [name]  Date of Exam:   11/13/2021 Medical Rec #: [id number]          Accession #:    [id number] Date of Birth: Sep 07, 1956         Patient Gender: F Patient Age:   80 years Exam Location:  [facility] Procedure:      VAS [name] LOWER EXTREMITY VENOUS (DVT) Referring Phys: Domenic Moras --------------------------------------------------------------------------------  Indications: Swelling.  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 Comparison Study: Previous exam on 03/19/2020 was negative for DVT Performing Technologist: Rogelia Rohrer RVT, RDMS  Examination Guidelines: A complete evaluation includes B-mode imaging, spectral Doppler, color Doppler, and power Doppler as needed of all accessible portions of each vessel. Bilateral testing is considered an integral part of a complete examination. Limited  examinations for reoccurring indications may be performed as noted. The reflux portion of the exam is performed with the patient in reverse Trendelenburg.  +---------+---------------+---------+-----------+----------+-------------------+  RIGHT     Compressibility Phasicity Spontaneity Properties Thrombus Aging       +---------+---------------+---------+-----------+----------+-------------------+  CFV       Full            Yes       Yes                                         +---------+---------------+---------+-----------+----------+-------------------+  SFJ       Full                                                                  +---------+---------------+---------+-----------+----------+-------------------+  FV Prox   Full            Yes       Yes                                         +---------+---------------+---------+-----------+----------+-------------------+  FV Mid    Full            Yes       Yes                                         +---------+---------------+---------+-----------+----------+-------------------+  FV Distal Full            Yes       Yes                                         +---------+---------------+---------+-----------+----------+-------------------+  PFV       Full                                                                  +---------+---------------+---------+-----------+----------+-------------------+  POP       Full            Yes       Yes                                         +---------+---------------+---------+-----------+----------+-------------------+  PTV       Full                                                                  +---------+---------------+---------+-----------+----------+-------------------+  PERO      Full                                             Not well visualized  +---------+---------------+---------+-----------+----------+-------------------+   +----+---------------+---------+-----------+----------+--------------+   LEFT Compressibility Phasicity Spontaneity Properties Thrombus Aging  +----+---------------+---------+-----------+----------+--------------+  CFV  Full            Yes       Yes                                    +----+---------------+---------+-----------+----------+--------------+     Summary: RIGHT: - There is no evidence of deep vein thrombosis in the lower extremity. - There is no evidence of superficial venous thrombosis.  - No cystic structure found in the popliteal fossa. - Ultrasound characteristics of enlarged lymph nodes are noted in the groin. -Subcutaneous edema seen in area  of calf.  LEFT: - No evidence of common femoral vein obstruction. - Ultrasound characteristics of enlarged lymph nodes noted in the groin.  *See table(s) above for measurements and observations. Electronically signed by Servando Snare MD on 11/13/2021 at 5:15:23 PM.    Final    [name] EKG SITE RITE  Result Date: 11/17/2021 If Site Rite image not attached, placement could not be confirmed due to current cardiac rhythm.   Microbiology: Recent Results (from the past 240 hour(s))  Resp Panel by RT-PCR (Flu A&B, Covid) Nasopharyngeal Swab     Status: None   Collection Time: 11/13/21  3:23 PM   Specimen: Nasopharyngeal Swab; Nasopharyngeal(NP) swabs in vial transport medium  Result Value Ref Range Status   SARS Coronavirus 2 by RT PCR NEGATIVE NEGATIVE Final    Comment: (NOTE) SARS-CoV-2 target nucleic acids are NOT DETECTED.  The SARS-CoV-2 RNA is generally detectable in upper respiratory specimens during the acute phase of infection. The lowest concentration of SARS-CoV-2 viral copies this assay can detect is 138 copies/mL. A negative result does not preclude SARS-Cov-2 infection and should not be used as the sole basis for treatment or other patient management decisions. A negative result may occur with  improper specimen collection/handling, submission of specimen other than nasopharyngeal swab, presence of viral  mutation(s) within the areas targeted by this assay, and inadequate number of viral copies(<138 copies/mL). A negative result must be combined with clinical observations, patient history, and epidemiological information. The expected result is Negative.  Fact Sheet for Patients:  EntrepreneurPulse.com.au  Fact Sheet for Healthcare Providers:  IncredibleEmployment.be  This test is no t yet approved or cleared by the [place] FDA and  has been authorized for detection and/or diagnosis of SARS-CoV-2 by FDA under an Emergency Use Authorization (EUA). This EUA will remain  in effect (meaning this test can be used) for the duration of the COVID-19 declaration under Section 564(b)(1) of the Act, 21 U.S.C.section 360bbb-3(b)(1), unless the authorization is terminated  or revoked sooner.       Influenza A by PCR NEGATIVE NEGATIVE Final   Influenza B by PCR NEGATIVE NEGATIVE Final    Comment: (NOTE) The Xpert Xpress SARS-CoV-2/FLU/RSV plus assay is intended as an aid in the diagnosis of influenza from Nasopharyngeal swab specimens and should not be used as a sole basis for treatment. Nasal washings and aspirates are unacceptable for Xpert Xpress SARS-CoV-2/FLU/RSV testing.  Fact Sheet for Patients: EntrepreneurPulse.com.au  Fact Sheet for Healthcare Providers: IncredibleEmployment.be  This test is not yet approved or cleared by the [place] FDA and has been authorized for detection and/or diagnosis of SARS-CoV-2 by FDA under an Emergency Use Authorization (EUA). This EUA will remain in effect (meaning this test can be used) for the duration of the COVID-19 declaration under Section 564(b)(1) of the Act, 21 U.S.C. section 360bbb-3(b)(1), unless the authorization is terminated or revoked.  Performed at [facility], [place] [address] [place], [place] [address]   Blood culture (routine  x 2)     Status: None   Collection Time: 11/13/21  3:45 PM   Specimen: BLOOD  Result Value Ref Range Status   Specimen Description   Final    BLOOD RIGHT ANTECUBITAL Performed at [place] [address] [place], [place] [address]    Special Requests   Final    BOTTLES DRAWN AEROBIC AND ANAEROBIC Blood Culture adequate volume Performed at [place] [facility] [address], [place], [place] [address]  Culture   Final    NO GROWTH 5 DAYS Performed at [facility], [place] [address] [place], [place] [address]    Report Status 11/18/2021 FINAL  Final  Blood culture (routine x 2)     Status: None (Preliminary result)   Collection Time: 11/13/21 10:56 PM   Specimen: BLOOD LEFT HAND  Result Value Ref Range Status   Specimen Description   Final    BLOOD LEFT HAND Performed at [place] [address] [place], [place] [address]    Special Requests   Final    BOTTLES DRAWN AEROBIC ONLY Blood Culture adequate volume Performed at [place] [address], [place], [place] [address]    Culture   Final    NO GROWTH 4 DAYS Performed at [facility], [place] [address] [place], [place] [address]    Report Status PENDING  Incomplete  MRSA Next Gen by PCR, Nasal     Status: None   Collection Time: 11/16/21  4:52 PM   Specimen: Nasal Mucosa; Nasal Swab  Result Value Ref Range Status   MRSA by PCR Next Gen NOT DETECTED NOT DETECTED Final    Comment: (NOTE) The GeneXpert MRSA Assay (FDA approved for NASAL specimens only), is one component of a comprehensive MRSA colonization surveillance program. It is not intended to diagnose MRSA infection nor to guide or monitor treatment for MRSA infections. Test performance is not FDA approved in patients less than 59 years old. Performed at [facility], [place] [address], [place], [place] [address]   Culture, blood (single)     Status: None  (Preliminary result)   Collection Time: 11/17/21  8:01 AM   Specimen: BLOOD LEFT HAND  Result Value Ref Range Status   Specimen Description   Final    BLOOD LEFT HAND Performed at [facility] [address], [place], [place] [address]    Special Requests   Final    BOTTLES DRAWN AEROBIC AND ANAEROBIC Blood Culture adequate volume Performed at [place] [address] [place], [place] [address]    Culture   Final    NO GROWTH < 24 HOURS Performed at [place] [address], [place], [place] [address]    Report Status PENDING  Incomplete  Culture, blood (single)     Status: None (Preliminary result)   Collection Time: 11/17/21  9:40 AM   Specimen: BLOOD  Result Value Ref Range Status   Specimen Description   Final    BLOOD LEFT ANTECUBITAL Performed at [facility] [address] [place], [place] [address]    Special Requests   Final    BOTTLES DRAWN AEROBIC ONLY Blood Culture adequate volume Performed at [place] [address], [place], [place] [address]    Culture   Final    NO GROWTH < 24 HOURS Performed at [facility] [place] [address] [place], [place] [address]    Report Status PENDING  Incomplete     Labs: Basic Metabolic Panel: Recent Labs  Lab 11/13/21 1550 11/14/21 0616 11/15/21 0601 11/17/21 0526  NA 133* 130* 135 140  K 3.0* 3.6 4.7 4.1  CL 96* 95* 103 106  CO2 _0 GLUCOSE 112* 107* 123* 165*  BUN 7* 7* 7* 11  CREATININE 0.78 0.69 0.56 0.56  CALCIUM 8.3* 7.7* 8.3* 8.4*  MG 1.6* 2.2  --  1.9  PHOS  --   --   --  3.9   Liver Function Tests: Recent Labs  Lab 11/15/21 0601 11/17/21 0526  AST 32 39  ALT 26 34  ALKPHOS 55 56  BILITOT 0.4 0.2*  PROT 6.0* 6.5  ALBUMIN 2.5* 2.8*   No results for input(s): LIPASE, AMYLASE in the last 168 hours. No results for input(s): AMMONIA in the last 168 hours. CBC: Recent Labs  Lab  11/13/21 1550 11/14/21 0616 11/15/21 0601 11/16/21 0605 11/17/21 0526  WBC 7.1 4.8 3.2* 3.9* 7.8  NEUTROABS 6.3  --   --   --  4.7  HGB 12.2 10.1* 9.4* 8.9* 10.3*  HCT 34.0* 28.8* 26.8* 25.3* 29.7*  MCV 99.1 100.7* 101.1* 100.4* 102.4*  PLT 319 224 172 156 195   Cardiac Enzymes: Recent Labs  Lab 11/18/21 0651  CKTOTAL 24*   BNP: BNP (last 3 results) Recent Labs    11/13/21 1550  BNP 88.3    ProBNP (last 3 results) No results for input(s): PROBNP in the last 8760 hours.  CBG: No results for input(s): GLUCAP in the last 168 hours.     Signed:  Kayleen Memos, MD [facility] 11/18/2021, 3:37 PM

## 2021-11-18 NOTE — Progress Notes (Signed)
Received call from CCMD that patient's heart rate was in the 150's. Patient's husband is currently assisting her to wash up in the bathroom. ?

## 2021-11-18 NOTE — Progress Notes (Signed)
PROGRESS NOTE  Virginia [name] SKA:[id number] DOB: 02-05-1956 DOA: 11/13/2021 PCP: Orson Slick, MD  HPI/Recap of past 24 hours: Virginia [name] is a 66 y.o. 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  Work-up revealed concern for port site infection.  RLE cellulitis, ruled out.  On 11/16/2021 patient reports painful port site, IR consulted, evaluated patient at bedside.  Due to concern for possible infection, plan for port removal by IR on 11/17/2021.  Infectious disease consulted and following.    11/18/2021: Seen and examined at bedside.  Port-A-Cath removed on 11/17/2021 by IR.  PICC line placed for IV antibiotics.  Bilateral lower extremity rash is improving.  Receptive to receive education on PICC line maintenance.  Appreciate TOC's assistance with home health services arrangements.  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 -She has been afebrile in the hospital -She was empirically started on vancomycin and cefepime, DC'd on 11/14/2021. Due to concern for possible port access infection, MRSA screening test obtained and negative.  She was restarted on IV vancomycin and cefepime on 11/16/2021.  Blood cultures reordered by ID, to be collected at site of port and peripherally.  Port-A-Cath infection, POA IR consulted, Port-A-Cath removed on 11/17/2021 by  IR.   ID recommended IV antibiotics at discharge.   Currently on IV daptomycin and ciprofloxacin Follow repeated blood cultures taken on 11/17/2021. Blood cultures from 11/13/2021 negative to date. PICC line placed on 11/17/2021.  Plan for IV antibiotics outpatient. Appreciate TOC's assistance with home health services arrangement  Resolving rash, likely secondary to allergic reaction of unknown etiology -Rash seems more like a side effect of medication, she was started on scopolamine patch 2 days before appearance of rash -She also had chemotherapy on 11/09/2021 She received IV Solu-Medrol 40 mg twice daily, IV famotidine 20 mg twice daily, IV Benadryl 12.5 mg twice daily, all x1 day. Started on p.o. Benadryl, p.o. Allegra, and prednisone on 11/17/2021. EpiPen ordered for DC planning  Resolved leukopenia, suspect secondary to chemotherapy WBC 7.8 from 3.2.   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   -Follows with oncology Will need to follow-up with Dr. Lorenso Courier closely after discharge.   Resolved post repletion: Hypokalemia Serum potassium 4.7  Resolved hypovolemic hyponatremia Serum sodium 135 from 130.   Cancer associated pain Continue OxyContin every 12 hours and Oxy IR as needed.   Hypertension BP stable Continue to hold off home Norvasc for now to avoid hypotension.          Code Status: Full code  Family Communication: Updated her husband via phone.  Disposition Plan: Possible discharge to home on 11/18/2021 or when IR and ID sign off.  Consultants: IR ID  Procedures: Plan for Port-A-Cath removal by IR on 11/17/2021.  Antimicrobials: Cefadroxil 11/17/2021 IV vancomycin 11/16/2021  DVT prophylaxis: SQ Lovenox daily.  Status is: Inpatient Patient requires at least 2 midnights for further evaluation and treatment of  present condition.    Objective: Vitals:   11/17/21 1414 11/17/21 2009 11/17/21 2126 11/18/21 0428  BP: 129/84 (!) 148/94  (!) 162/98  Pulse: (!) 101 90  83  Resp: 16 18  18   Temp: 98.5 F (36.9 C) 98.4 F (36.9 C)  (!) 97.4 F (36.3 C)  TempSrc: Oral Oral  Oral  SpO2: 93% 96% 97% 96%  Weight:      Height:        Intake/Output Summary (Last 24 hours) at 11/18/2021 1421 Last data filed at 11/18/2021 0950 Gross per 24 hour  Intake 423 ml  Output 600 ml  Net -177 ml   Filed Weights   11/13/21 1632 11/13/21 2225  Weight: 78.6 kg 78.2 kg    Exam:  General: 66 y.o. year-old female well-developed well-nourished in no acute distress.  She is alert and oriented x3.   Cardiovascular: Regular rate and rhythm no rubs or gallops.   Respiratory: Clear to auscultation no wheezes or rales. Abdomen: Soft nontender bowel sounds present Musculoskeletal: Trace lower extremity edema bilaterally. Skin: Rash in upper and lower extremities bilaterally all improved. Psychiatry: Mood is appropriate for condition. Neuro: Moves all 4 extremities bilaterally.   Data Reviewed: CBC: Recent Labs  Lab 11/13/21 1550 11/14/21 0616 11/15/21 0601 11/16/21 0605 11/17/21 0526  WBC 7.1 4.8 3.2* 3.9* 7.8  NEUTROABS 6.3  --   --   --  4.7  HGB 12.2 10.1* 9.4* 8.9* 10.3*  HCT 34.0* 28.8* 26.8* 25.3* 29.7*  MCV 99.1 100.7* 101.1* 100.4* 102.4*  PLT 319 224 172 156 841   Basic Metabolic Panel: Recent Labs  Lab 11/13/21 1550 11/14/21 0616 11/15/21 0601 11/17/21 0526  NA 133* 130* 135 140  K 3.0* 3.6 4.7 4.1  CL 96* 95* 103 106  CO2 23 23 25 24   GLUCOSE 112* 107* 123* 165*  BUN 7* 7* 7* 11  CREATININE 0.78 0.69 0.56 0.56  CALCIUM 8.3* 7.7* 8.3* 8.4*  MG 1.6* 2.2  --  1.9  PHOS  --   --   --  3.9   GFR: Estimated Creatinine Clearance: 68.7 mL/min (by C-G formula based on SCr of 0.56 mg/dL). Liver Function Tests: Recent Labs  Lab 11/15/21 0601 11/17/21 0526  AST 32 39  ALT 26 34   ALKPHOS 55 56  BILITOT 0.4 0.2*  PROT 6.0* 6.5  ALBUMIN 2.5* 2.8*   No results for input(s): LIPASE, AMYLASE in the last 168 hours. No results for input(s): AMMONIA in the last 168 hours. Coagulation Profile: No results for input(s): INR, PROTIME in the last 168 hours. Cardiac Enzymes: Recent Labs  Lab 11/18/21 0651  CKTOTAL 24*   BNP (last 3 results) No results for input(s): PROBNP in the last 8760 hours. HbA1C: No results for input(s): HGBA1C in the last 72 hours. CBG: No results for input(s): GLUCAP in the last 168 hours. Lipid Profile: No results for input(s): CHOL, HDL, LDLCALC, TRIG, CHOLHDL, LDLDIRECT in the last 72 hours. Thyroid Function Tests: No results for input(s): TSH, T4TOTAL, FREET4, T3FREE, THYROIDAB in the last 72 hours. Anemia Panel: No results for input(s): VITAMINB12, FOLATE, FERRITIN, TIBC, IRON, RETICCTPCT in the last 72 hours. Urine analysis:    Component Value Date/Time   COLORURINE STRAW (A) 09/28/2021 [place] 09/28/2021 1032   LABSPEC 1.006  09/28/2021 1032   PHURINE 7.0 09/28/2021 1032   GLUCOSEU NEGATIVE 09/28/2021 1032   HGBUR NEGATIVE 09/28/2021 [place] 09/28/2021 1032   BILIRUBINUR NEG 06/12/2019 1639   KETONESUR NEGATIVE 09/28/2021 1032   PROTEINUR NEGATIVE 10/20/2021 1115   UROBILINOGEN 0.2 06/12/2019 1639   UROBILINOGEN 0.2 04/01/2014 1006   NITRITE NEGATIVE 09/28/2021 1032   LEUKOCYTESUR NEGATIVE 09/28/2021 1032   Sepsis Labs: @LABRCNTIP (procalcitonin:4,lacticidven:4)  ) Recent Results (from the past 240 hour(s))  Resp Panel by RT-PCR (Flu A&B, Covid) Nasopharyngeal Swab     Status: None   Collection Time: 11/13/21  3:23 PM   Specimen: Nasopharyngeal Swab; Nasopharyngeal(NP) swabs in vial transport medium  Result Value Ref Range Status   SARS Coronavirus 2 by RT PCR NEGATIVE NEGATIVE Final    Comment: (NOTE) SARS-CoV-2 target nucleic acids are NOT DETECTED.  The SARS-CoV-2 RNA is  generally detectable in upper respiratory specimens during the acute phase of infection. The lowest concentration of SARS-CoV-2 viral copies this assay can detect is 138 copies/mL. A negative result does not preclude SARS-Cov-2 infection and should not be used as the sole basis for treatment or other patient management decisions. A negative result may occur with  improper specimen collection/handling, submission of specimen other than nasopharyngeal swab, presence of viral mutation(s) within the areas targeted by this assay, and inadequate number of viral copies(<138 copies/mL). A negative result must be combined with clinical observations, patient history, and epidemiological information. The expected result is Negative.  Fact Sheet for Patients:  EntrepreneurPulse.com.au  Fact Sheet for Healthcare Providers:  IncredibleEmployment.be  This test is no t yet approved or cleared by the [place] FDA and  has been authorized for detection and/or diagnosis of SARS-CoV-2 by FDA under an Emergency Use Authorization (EUA). This EUA will remain  in effect (meaning this test can be used) for the duration of the COVID-19 declaration under Section 564(b)(1) of the Act, 21 U.S.C.section 360bbb-3(b)(1), unless the authorization is terminated  or revoked sooner.       Influenza A by PCR NEGATIVE NEGATIVE Final   Influenza B by PCR NEGATIVE NEGATIVE Final    Comment: (NOTE) The Xpert Xpress SARS-CoV-2/FLU/RSV plus assay is intended as an aid in the diagnosis of influenza from Nasopharyngeal swab specimens and should not be used as a sole basis for treatment. Nasal washings and aspirates are unacceptable for Xpert Xpress SARS-CoV-2/FLU/RSV testing.  Fact Sheet for Patients: EntrepreneurPulse.com.au  Fact Sheet for Healthcare Providers: IncredibleEmployment.be  This test is not yet approved or cleared by the [place] FDA and has been authorized for detection and/or diagnosis of SARS-CoV-2 by FDA under an Emergency Use Authorization (EUA). This EUA will remain in effect (meaning this test can be used) for the duration of the COVID-19 declaration under Section 564(b)(1) of the Act, 21 U.S.C. section 360bbb-3(b)(1), unless the authorization is terminated or revoked.  Performed at [facility], [place] [address] [place], [place] [address]   Blood culture (routine x 2)     Status: None   Collection Time: 11/13/21  3:45 PM   Specimen: BLOOD  Result Value Ref Range Status   Specimen Description   Final    BLOOD RIGHT ANTECUBITAL Performed at [place] [address], [place], [place] [address]    Special Requests   Final    BOTTLES DRAWN AEROBIC AND ANAEROBIC Blood Culture adequate volume Performed at [place] [address] [place], [place] [address]  Culture   Final    NO GROWTH 5 DAYS Performed at [facility], [place] [address] [place], [place] [address]    Report Status 11/18/2021 FINAL  Final  Blood culture (routine x 2)     Status: None (Preliminary result)   Collection Time: 11/13/21 10:56 PM   Specimen: BLOOD LEFT HAND  Result Value Ref Range Status   Specimen Description   Final    BLOOD LEFT HAND Performed at [place] [address] [place], [place] [address]    Special Requests   Final    BOTTLES DRAWN AEROBIC ONLY Blood Culture adequate volume Performed at [facility] [address], [place], [place] [address]    Culture   Final    NO GROWTH 4 DAYS Performed at [facility], [place] [address], [place], [place] [address]    Report Status PENDING  Incomplete  MRSA Next Gen by PCR, Nasal     Status: None   Collection Time: 11/16/21  4:52 PM   Specimen: Nasal Mucosa; Nasal Swab  Result Value Ref Range Status   MRSA by PCR Next Gen NOT DETECTED NOT  DETECTED Final    Comment: (NOTE) The GeneXpert MRSA Assay (FDA approved for NASAL specimens only), is one component of a comprehensive MRSA colonization surveillance program. It is not intended to diagnose MRSA infection nor to guide or monitor treatment for MRSA infections. Test performance is not FDA approved in patients less than 70 years old. Performed at [facility], [place] [address], [place], [place] [address]   Culture, blood (single)     Status: None (Preliminary result)   Collection Time: 11/17/21  8:01 AM   Specimen: BLOOD LEFT HAND  Result Value Ref Range Status   Specimen Description   Final    BLOOD LEFT HAND Performed at [facility] [address] [place], [place] [address]    Special Requests   Final    BOTTLES DRAWN AEROBIC AND ANAEROBIC Blood Culture adequate volume Performed at [facility] [address], [place], [place] [address]    Culture   Final    NO GROWTH < 24 HOURS Performed at [facility] [address], [place], [place] [address]    Report Status PENDING  Incomplete  Culture, blood (single)     Status: None (Preliminary result)   Collection Time: 11/17/21  9:40 AM   Specimen: BLOOD  Result Value Ref Range Status   Specimen Description   Final    BLOOD LEFT ANTECUBITAL Performed at [facility] [address] [place], [place] [address]    Special Requests   Final    BOTTLES DRAWN AEROBIC ONLY Blood Culture adequate volume Performed at [facility] [address], [place], [place] [address]    Culture   Final    NO GROWTH < 24 HOURS Performed at [facility] [address], [place], [place] [address]    Report Status PENDING  Incomplete      Studies: ECHOCARDIOGRAM COMPLETE  Result Date: 11/17/2021    ECHOCARDIOGRAM REPORT   Patient Name:   FRANCHON KETTERMAN Date of Exam: 11/17/2021 Medical Rec #:  [id number]         Height:        62.0 in Accession #:    [id number]        Weight:       172.4  lb Date of Birth:  1955-10-23        BSA:          1.795 m Patient Age:    66 years          BP:           129/84 mmHg Patient Gender: F                 HR:           88 bpm. Exam Location:  Inpatient Procedure: Cardiac Doppler, Strain Analysis, Limited Echo and Limited Color            Doppler Indications:    Endocarditis  History:        Patient has no prior history of Echocardiogram examinations.                 COPD, Signs/Symptoms:Dyspnea; Risk Factors:Hypertension and                 Dyslipidemia.  Sonographer:    Dakota Referring Phys: [id number] [facility]  1. Left ventricular ejection fraction, by estimation, is 50%. The left ventricle has low normal function. Left ventricular endocardial border not optimally defined to evaluate regional wall motion. There is severe asymmetric left ventricular hypertrophy  of the basal-septal segment. Left ventricular diastolic parameters are indeterminate.  2. Right ventricular systolic function was not well visualized. The right ventricular size is not well visualized. There is normal pulmonary artery systolic pressure.  3. The mitral valve was not well visualized. No evidence of mitral valve regurgitation.  4. The aortic valve was not well visualized. Aortic valve regurgitation is not visualized. No aortic stenosis is present.  5. Aortic dilatation noted. There is mild dilatation of the ascending aorta, measuring 40 mm.  6. The inferior vena cava is normal in size with greater than 50% respiratory variability, suggesting right atrial pressure of 3 mmHg. Comparison(s): No prior Echocardiogram. FINDINGS  Left Ventricle: Left ventricular ejection fraction, by estimation, is 50%. The left ventricle has low normal function. Left ventricular endocardial border not optimally defined to evaluate regional wall motion. Global longitudinal strain performed but not reported based on interpreter  judgement due to suboptimal tracking. The left ventricular internal cavity size was normal in size. There is severe asymmetric left ventricular hypertrophy of the basal-septal segment. Left ventricular diastolic parameters are indeterminate. Right Ventricle: The right ventricular size is not well visualized. Right vetricular wall thickness was not well visualized. Right ventricular systolic function was not well visualized. There is normal pulmonary artery systolic pressure. The tricuspid regurgitant velocity is 1.43 m/s, and with an assumed right atrial pressure of 3 mmHg, the estimated right ventricular systolic pressure is 35.3 mmHg. Left Atrium: Left atrial size was normal in size. Right Atrium: Right atrial size was not well visualized. Pericardium: There is no evidence of pericardial effusion. Mitral Valve: The mitral valve was not well visualized. No evidence of mitral valve regurgitation. MV peak gradient, 5.8 mmHg. The mean mitral valve gradient is 3.0 mmHg. Tricuspid Valve: The tricuspid valve is not well visualized. Tricuspid valve regurgitation is mild . No evidence of tricuspid stenosis. Aortic Valve: The aortic valve was not well visualized. Aortic valve regurgitation is not visualized. No aortic stenosis is present. Aortic valve mean gradient measures 3.0 mmHg. Aortic valve peak gradient measures 5.4 mmHg. Aortic valve area, by VTI measures 3.07 cm. Pulmonic Valve: The pulmonic valve was grossly normal. Pulmonic valve regurgitation is not visualized. No  evidence of pulmonic stenosis. Aorta: Aortic dilatation noted. There is mild dilatation of the ascending aorta, measuring 40 mm. Venous: The inferior vena cava is normal in size with greater than 50% respiratory variability, suggesting right atrial pressure of 3 mmHg. IAS/Shunts: No atrial level shunt detected by color flow Doppler.  LEFT VENTRICLE PLAX 2D LVIDd:         4.70 cm     Diastology LVIDs:         2.60 cm     LV e' medial:    4.46 cm/s LV  PW:         1.30 cm     LV E/e' medial:  16.2 LV IVS:        1.50 cm     LV e' lateral:   5.22 cm/s LVOT diam:     2.20 cm     LV E/e' lateral: 13.8 LV SV:         59 LV SV Index:   33 LVOT Area:     3.80 cm  LV Volumes (MOD) LV vol d, MOD A2C: 63.1 ml LV vol d, MOD A4C: 83.6 ml LV vol s, MOD A2C: 27.2 ml LV vol s, MOD A4C: 52.2 ml LV SV MOD A2C:     35.9 ml LV SV MOD A4C:     83.6 ml LV SV MOD BP:      35.2 ml LEFT ATRIUM             Index LA diam:        2.40 cm 1.34 cm/m LA Vol (A2C):   19.8 ml 11.03 ml/m LA Vol (A4C):   22.7 ml 12.65 ml/m LA Biplane Vol: 22.1 ml 12.31 ml/m  AORTIC VALVE                    PULMONIC VALVE AV Area (Vmax):    3.41 cm     PV Vmax:       0.90 m/s AV Area (Vmean):   3.31 cm     PV Vmean:      62.800 cm/s AV Area (VTI):     3.07 cm     PV VTI:        0.155 m AV Vmax:           116.00 cm/s  PV Peak grad:  3.2 mmHg AV Vmean:          76.000 cm/s  PV Mean grad:  2.0 mmHg AV VTI:            0.192 m AV Peak Grad:      5.4 mmHg AV Mean Grad:      3.0 mmHg LVOT Vmax:         104.00 cm/s LVOT Vmean:        66.200 cm/s LVOT VTI:          0.155 m LVOT/AV VTI ratio: 0.81  AORTA Ao Root diam: 3.10 cm Ao Asc diam:  3.90 cm MITRAL VALVE                TRICUSPID VALVE MV Area (PHT): 10.25 cm    TR Peak grad:   8.2 mmHg MV Area VTI:   2.83 cm     TR Vmax:        143.00 cm/s MV Peak grad:  5.8 mmHg MV Mean grad:  3.0 mmHg     SHUNTS MV Vmax:       1.20 m/s  Systemic VTI:  0.16 m MV Vmean:      79.1 cm/s    Systemic Diam: 2.20 cm MV Decel Time: 74 msec MV E velocity: 72.20 cm/s MV A velocity: 106.00 cm/s MV E/A ratio:  0.68 Rudean Haskell MD Electronically signed by Rudean Haskell MD Signature Date/Time: 11/17/2021/5:26:59 PM    Final    [name] EKG SITE RITE  Result Date: 11/17/2021 If Site Rite image not attached, placement could not be confirmed due to current cardiac rhythm.   Scheduled Meds:  Chlorhexidine Gluconate Cloth  6 each Topical Daily   ciprofloxacin  500 mg Oral  BID   diphenhydrAMINE  50 mg Oral Daily   enoxaparin (LOVENOX) injection  40 mg Subcutaneous Q24H   feeding supplement  237 mL Oral BID BM   folic acid  1 mg Oral Daily   loratadine  10 mg Oral Daily   multivitamin with minerals  1 tablet Oral Daily   OLANZapine  2.5 mg Oral QHS   oxyCODONE  15 mg Oral Q12H   potassium chloride  20 mEq Oral BID   predniSONE  40 mg Oral Q breakfast   senna-docusate  2 tablet Oral BID   sodium chloride flush  10-40 mL Intracatheter Q12H    Continuous Infusions:  DAPTOmycin (CUBICIN)  IV Stopped (11/17/21 1843)     LOS: 4 days     Kayleen Memos, MD [facility] Pager [phone]  If 7PM-7AM, please contact night-coverage [email or web address] Password [facility] 11/18/2021, 2:21 PM

## 2021-11-18 NOTE — TOC Progression Note (Addendum)
Transition of Care (TOC) - Progression Note  ? ? ?Patient Details  ?Name: Virginia [name] ?MRN: [id number] ?Date of Birth: 1956/03/18 ? ?Transition of Care (TOC) CM/SW Contact  ?Purcell Mouton, RN ?Phone Number: ?11/18/2021, 10:45 AM ? ?Clinical Narrative:    ? ?[employer] infusion rep Pam, RN following pt for discharge. Spoke with pt's husband, explained that Pam would contact him and his wife concerning teaching for IV infusion.  ? ? ?  ?  ? ?Expected Discharge Plan and Services ?  ?  ?  ?  ?  ?Expected Discharge Date: 11/18/21               ?  ?  ?  ?  ?  ?  ?  ?  ?  ?  ? ? ?Social Determinants of Health (SDOH) Interventions ?  ? ?Readmission Risk Interventions ?No flowsheet data found. ? ?

## 2021-11-18 NOTE — Progress Notes (Signed)
Supervising Physician: Richarda Overlie  Patient Status:  [facility] - In-pt  Chief Complaint:  Infected Port s/p removal  Subjective:  Feeling a little sore.  Allergies: Patient has no known allergies.  Medications: Prior to Admission medications   Medication Sig Start Date End Date Taking? Authorizing Provider  acetaminophen (TYLENOL) 500 MG tablet Take 1,000 mg by mouth every 8 (eight) hours as needed for moderate pain.   Yes [provider]  albuterol (VENTOLIN HFA) 108 (90 Base) MCG/ACT inhaler INHALE 2 PUFFS INTO THE LUNGS EVERY 6 HOURS AS NEEDED FOR WHEEZING OR SHORTNESS OF BREATH Patient taking differently: Inhale 2 puffs into the lungs every 6 (six) hours as needed for shortness of breath or wheezing. 11/23/20  Yes Baxley, Luanna Cole, MD  ALPRAZolam Prudy Feeler) 0.25 MG tablet Take 1 tablet (0.25 mg total) by mouth 2 (two) times daily as needed for anxiety 09/28/21  Yes Ulysees Barns IV, MD  amLODipine (NORVASC) 10 MG tablet TAKE 1 TABLET BY MOUTH EVERY DAY 10/11/21  Yes Jaci Standard, MD  Cyanocobalamin (VITAMIN B12 PO) Take 1 tablet by mouth daily. Gummies   Yes [provider]  cyclobenzaprine (FLEXERIL) 5 MG tablet Take 5 mg by mouth at bedtime as needed for muscle spasms. 04/13/21  Yes [provider]  daptomycin (CUBICIN) IVPB Inject 650 mg into the vein daily for 14 days. Indication:  PAC infection First Dose: Yes Last Day of Therapy:  12/01/21 Labs - Once weekly:  CBC/D, BMP, and CPK Labs - Every other week:  ESR and CRP Method of administration: IV Push Method of administration may be changed at the discretion of home infusion pharmacist based upon assessment of the patient and/or caregiver's ability to self-administer the medication ordered. 11/18/21 12/02/21 Yes Darlin Drop, DO  folic acid (FOLVITE) 1 MG tablet Take 1 tablet by mouth daily. 10/16/21 10/16/22 Yes Jaci Standard, MD  furosemide (LASIX) 20 MG tablet Take 2 tablets (40 mg total) by mouth 2  times daily. Patient taking differently: Take 20 mg by mouth 2 (two) times daily. 11/09/21  Yes Jaci Standard, MD  lidocaine-prilocaine (EMLA) cream Apply to port as needed. Patient taking differently: Apply 1 application. topically as needed (acess port). 02/17/21  Yes Jaci Standard, MD  meclizine (ANTIVERT) 25 MG tablet Take 1 tablet (25 mg total) by mouth 3 (three) times daily as needed for dizziness. 09/28/21  Yes Vaslow, Georgeanna Lea, MD  Multiple Vitamins-Minerals (AIRBORNE PO) Take 1 tablet by mouth daily.    Yes [provider]  ofloxacin (OCUFLOX) 0.3 % ophthalmic solution 2 drops each eye 4 times a day for 5 days Patient taking differently: Place 2 drops into both eyes daily as needed (irritation). 02/23/20  Yes Baxley, Luanna Cole, MD  OLANZapine (ZYPREXA) 2.5 MG tablet TAKE 1 TABLET BY MOUTH EVERY NIGHT AT BEDTIME 07/24/21 07/24/22 Yes Jaci Standard, MD  ondansetron (ZOFRAN) 4 MG tablet TAKE 1 TO 2 TABLETS BY MOUTH 2 TIMES DAILY AS NEEDED Patient taking differently: Take 4-8 mg by mouth 2 (two) times daily as needed for nausea or vomiting. 12/06/20 12/06/21 Yes Council Mechanic, NP  oxyCODONE (OXY IR/ROXICODONE) 5 MG immediate release tablet TAKE 1 TO 2 TABLETS BY MOUTH EVERY 4 HOURS AS NEEDED FOR SEVERE PAIN 10/21/21 04/19/22 Yes Jaci Standard, MD  pantoprazole (PROTONIX) 40 MG tablet TAKE 1 TABLET BY MOUTH ONCE DAILY 09/17/21  Yes Baxley, Luanna Cole, MD  polyethylene  glycol (MIRALAX / GLYCOLAX) 17 g packet Take 17 g by mouth every evening.   Yes [provider]  potassium chloride SA (KLOR-CON M) 20 MEQ tablet Take 2 tablets in the morning and 1 tablet in the evening. Patient taking differently: 20 mEq 2 (two) times daily. 11/09/21  Yes Orson Slick, MD  scopolamine (TRANSDERM-SCOP) 1 MG/3DAYS Place 1 patch (1.5 mg total) onto the skin every 3 (three) days. 10/31/21  Yes Vaslow, Acey Lav, MD  Vitamin D3 (VITAMIN D) 25 MCG tablet Take 2,000 Units by mouth every evening.    Yes [provider]  vitamin E 180 MG (400 UNITS) capsule Take 400 IU daily x 1 week, then 400 IU BID Patient taking differently: Take 400 Units by mouth daily. 08/22/20  Yes [name], MD  ciprofloxacin (CIPRO) 500 MG tablet Take 1 tablet (500 mg total) by mouth 2 (two) times daily for 14 days. End date 3/24 11/18/21 12/02/21  Kayleen Memos, DO  dexamethasone (DECADRON) 1 MG tablet Take 2 tablets by mouth daily. Patient not taking: Reported on 11/09/2021 09/12/21   Ventura Sellers, MD  diphenhydrAMINE (BENADRYL) 50 MG tablet Take 1 tablet by mouth at bedtime for 8 days. 11/17/21 11/25/21  Kayleen Memos, DO  EPINEPHrine 0.3 mg/0.3 mL IJ SOAJ injection Inject 0.3 mg into the muscle as needed for anaphylaxis. 11/17/21   Kayleen Memos, DO  fexofenadine (ALLEGRA ALLERGY) 180 MG tablet Take 1 tablet by mouth daily for 8 days. 11/17/21 11/25/21  Kayleen Memos, DO  pentoxifylline (TRENTAL) 400 MG CR tablet TAKE 1 TABLET BY MOUTH WITH FOOD FOR 1 WEEK THEN TAKE 1 TABLET BY MOUTH 2 TIMES DAILY THEREAFTER Patient not taking: Reported on 11/09/2021 01/02/21 01/02/22  [name], MD  predniSONE (DELTASONE) 10 MG tablet Take 4 tablets by mouth daily for 2 days then decrease by 1 tablet every other day (4-4-3-3-2-2-1-1) 11/17/21   Kayleen Memos, DO  promethazine (PHENERGAN) 12.5 MG tablet Take 1 to 2 tablets by mouth every 6 hours as needed for nausea Patient not taking: Reported on 11/13/2021 05/02/21     XTAMPZA ER 13.5 MG C12A Take 1 capsule by mouth in the morning and at bedtime. (every 12 hours) 11/16/21 05/15/22  Orson Slick, MD     Vital Signs: BP (!) 162/98 (BP Location: Left Arm)    Pulse 83    Temp (!) 97.4 F (36.3 C) (Oral)    Resp 18    Ht $R'5\' 2"'Tu$  (1.575 m)    Wt 172 lb 6.4 oz (78.2 kg)    SpO2 96%    BMI 31.53 kg/m   Physical Exam Vitals reviewed.  Constitutional:      General: She is not in acute distress. HENT:     Head: Normocephalic and atraumatic.     Mouth/Throat:     Mouth:  Mucous membranes are moist.     Pharynx: Oropharynx is clear.  Eyes:     Extraocular Movements: Extraocular movements intact.  Cardiovascular:     Rate and Rhythm: Normal rate.  Pulmonary:     Effort: Pulmonary effort is normal.  Chest:     Comments: Incision with iodoform packing and wet to dry dressing.  No erythema or discharge.  Moderate tenderness to palpation. Skin:    General: Skin is warm and dry.  Neurological:     General: No focal deficit present.     Mental Status: She is alert and oriented to  person, place, and time.  Psychiatric:        Mood and Affect: Mood normal.        Behavior: Behavior normal.    Imaging: IR REMOVAL TUN ACCESS W/ PORT W/O FL MOD SED  Result Date: 11/17/2021 CLINICAL DATA:  Status post Port-A-Cath placement on 10/23/2019 to treat lung carcinoma. Recently the patient has developed significant chest wall cellulitis surrounding the subcutaneous port as well as a draining wound overlying the port reservoir where access needles have been placed. Due to the significant cellulitis and evidence bacteremia, port removal has been recommended. EXAM: REMOVAL OF IMPLANTED TUNNELED PORT-A-CATH MEDICATIONS: Moderate (conscious) sedation was employed during this procedure. A total of Versed 2.0 mg and Fentanyl 50 mcg was administered intravenously by radiology nursing. Moderate Sedation Time: 16 minutes. The patient's level of consciousness and vital signs were monitored continuously by radiology nursing throughout the procedure under my direct supervision. PROCEDURE: The right chest Port-A-Cath site was prepped with chlorhexidine. A sterile gown and gloves were worn during the procedure. Local anesthesia was provided with 1% lidocaine. An incision was made overlying the Port-A-Cath with a #15 scalpel. Utilizing sharp and blunt dissection, the Port-A-Cath was removed. Portable cautery was utilized. Retention sutures were removed. The pocked was irrigated with sterile  saline. Quarter inch iodoform gauze was then packed into the wound and a dressing applied over the wound. The entire Port-A-Cath was removed successfully. FINDINGS: No gross purulence was present in the port pocket. However, the small rounded area of ulceration overlying the port site goes all the way through to the level of the port pocket after port removal and therefore the pocket is consider contaminated and cannot be closed. Iodoform gauze was placed initially into the pocket and will be removed at a later time to allow the pocket to heal by secondary intention. IMPRESSION: Removal of implanted Port-A-Cath utilizing sharp and blunt dissection. Due to full thickness ulceration overlying the port reservoir, the pocket was packed with iodoform gauze. The wound will be allowed to heal by secondary intention. Electronically Signed   By: Aletta Edouard M.D.   On: 11/17/2021 13:55   ECHOCARDIOGRAM COMPLETE  Result Date: 11/17/2021    ECHOCARDIOGRAM REPORT   Patient Name:   Virginia [name] Date of Exam: 11/17/2021 Medical Rec #:  [id number]         Height:       62.0 in Accession #:    [id number]        Weight:       172.4 lb Date of Birth:  [birth year]        BSA:          1.795 m Patient Age:    66 years          BP:           129/84 mmHg Patient Gender: F                 HR:           88 bpm. Exam Location:  Inpatient Procedure: Cardiac Doppler, Strain Analysis, Limited Echo and Limited Color            Doppler Indications:    Endocarditis  History:        Patient has no prior history of Echocardiogram examinations.                 COPD, Signs/Symptoms:Dyspnea; Risk Factors:Hypertension and  Dyslipidemia.  Sonographer:    Neomia Dear RDCS Referring Phys: [id number] [facility] IMPRESSIONS  1. Left ventricular ejection fraction, by estimation, is 50%. The left ventricle has low normal function. Left ventricular endocardial border not optimally defined to evaluate regional wall motion. There is  severe asymmetric left ventricular hypertrophy  of the basal-septal segment. Left ventricular diastolic parameters are indeterminate.  2. Right ventricular systolic function was not well visualized. The right ventricular size is not well visualized. There is normal pulmonary artery systolic pressure.  3. The mitral valve was not well visualized. No evidence of mitral valve regurgitation.  4. The aortic valve was not well visualized. Aortic valve regurgitation is not visualized. No aortic stenosis is present.  5. Aortic dilatation noted. There is mild dilatation of the ascending aorta, measuring 40 mm.  6. The inferior vena cava is normal in size with greater than 50% respiratory variability, suggesting right atrial pressure of 3 mmHg. Comparison(s): No prior Echocardiogram. FINDINGS  Left Ventricle: Left ventricular ejection fraction, by estimation, is 50%. The left ventricle has low normal function. Left ventricular endocardial border not optimally defined to evaluate regional wall motion. Global longitudinal strain performed but not reported based on interpreter judgement due to suboptimal tracking. The left ventricular internal cavity size was normal in size. There is severe asymmetric left ventricular hypertrophy of the basal-septal segment. Left ventricular diastolic parameters are indeterminate. Right Ventricle: The right ventricular size is not well visualized. Right vetricular wall thickness was not well visualized. Right ventricular systolic function was not well visualized. There is normal pulmonary artery systolic pressure. The tricuspid regurgitant velocity is 1.43 m/s, and with an assumed right atrial pressure of 3 mmHg, the estimated right ventricular systolic pressure is 11.2 mmHg. Left Atrium: Left atrial size was normal in size. Right Atrium: Right atrial size was not well visualized. Pericardium: There is no evidence of pericardial effusion. Mitral Valve: The mitral valve was not well visualized.  No evidence of mitral valve regurgitation. MV peak gradient, 5.8 mmHg. The mean mitral valve gradient is 3.0 mmHg. Tricuspid Valve: The tricuspid valve is not well visualized. Tricuspid valve regurgitation is mild . No evidence of tricuspid stenosis. Aortic Valve: The aortic valve was not well visualized. Aortic valve regurgitation is not visualized. No aortic stenosis is present. Aortic valve mean gradient measures 3.0 mmHg. Aortic valve peak gradient measures 5.4 mmHg. Aortic valve area, by VTI measures 3.07 cm. Pulmonic Valve: The pulmonic valve was grossly normal. Pulmonic valve regurgitation is not visualized. No evidence of pulmonic stenosis. Aorta: Aortic dilatation noted. There is mild dilatation of the ascending aorta, measuring 40 mm. Venous: The inferior vena cava is normal in size with greater than 50% respiratory variability, suggesting right atrial pressure of 3 mmHg. IAS/Shunts: No atrial level shunt detected by color flow Doppler.  LEFT VENTRICLE PLAX 2D LVIDd:         4.70 cm     Diastology LVIDs:         2.60 cm     LV e' medial:    4.46 cm/s LV PW:         1.30 cm     LV E/e' medial:  16.2 LV IVS:        1.50 cm     LV e' lateral:   5.22 cm/s LVOT diam:     2.20 cm     LV E/e' lateral: 13.8 LV SV:         59 LV SV Index:  33 LVOT Area:     3.80 cm  LV Volumes (MOD) LV vol d, MOD A2C: 63.1 ml LV vol d, MOD A4C: 83.6 ml LV vol s, MOD A2C: 27.2 ml LV vol s, MOD A4C: 52.2 ml LV SV MOD A2C:     35.9 ml LV SV MOD A4C:     83.6 ml LV SV MOD BP:      35.2 ml LEFT ATRIUM             Index LA diam:        2.40 cm 1.34 cm/m LA Vol (A2C):   19.8 ml 11.03 ml/m LA Vol (A4C):   22.7 ml 12.65 ml/m LA Biplane Vol: 22.1 ml 12.31 ml/m  AORTIC VALVE                    PULMONIC VALVE AV Area (Vmax):    3.41 cm     PV Vmax:       0.90 m/s AV Area (Vmean):   3.31 cm     PV Vmean:      62.800 cm/s AV Area (VTI):     3.07 cm     PV VTI:        0.155 m AV Vmax:           116.00 cm/s  PV Peak grad:  3.2 mmHg AV  Vmean:          76.000 cm/s  PV Mean grad:  2.0 mmHg AV VTI:            0.192 m AV Peak Grad:      5.4 mmHg AV Mean Grad:      3.0 mmHg LVOT Vmax:         104.00 cm/s LVOT Vmean:        66.200 cm/s LVOT VTI:          0.155 m LVOT/AV VTI ratio: 0.81  AORTA Ao Root diam: 3.10 cm Ao Asc diam:  3.90 cm MITRAL VALVE                TRICUSPID VALVE MV Area (PHT): 10.25 cm    TR Peak grad:   8.2 mmHg MV Area VTI:   2.83 cm     TR Vmax:        143.00 cm/s MV Peak grad:  5.8 mmHg MV Mean grad:  3.0 mmHg     SHUNTS MV Vmax:       1.20 m/s     Systemic VTI:  0.16 m MV Vmean:      79.1 cm/s    Systemic Diam: 2.20 cm MV Decel Time: 74 msec MV E velocity: 72.20 cm/s MV A velocity: 106.00 cm/s MV E/A ratio:  0.68 Rudean Haskell MD Electronically signed by Rudean Haskell MD Signature Date/Time: 11/17/2021/5:26:59 PM    Final    [name] EKG SITE RITE  Result Date: 11/17/2021 If Site Rite image not attached, placement could not be confirmed due to current cardiac rhythm.   Labs:  CBC: Recent Labs    11/14/21 0616 11/15/21 0601 11/16/21 0605 11/17/21 0526  WBC 4.8 3.2* 3.9* 7.8  HGB 10.1* 9.4* 8.9* 10.3*  HCT 28.8* 26.8* 25.3* 29.7*  PLT 224 172 156 195    COAGS: No results for input(s): INR, APTT in the last 8760 hours.  BMP: Recent Labs    11/13/21 1550 11/14/21 0616 11/15/21 0601 11/17/21 0526  NA 133* 130* 135 140  K 3.0* 3.6 4.7  4.1  CL 96* 95* 103 106  CO2 $Re'23 23 25 24  'bxe$ GLUCOSE 112* 107* 123* 165*  BUN 7* 7* 7* 11  CALCIUM 8.3* 7.7* 8.3* 8.4*  CREATININE 0.78 0.69 0.56 0.56  GFRNONAA >60 >60 >60 >60    LIVER FUNCTION TESTS: Recent Labs    10/20/21 1115 11/09/21 1029 11/15/21 0601 11/17/21 0526  BILITOT 0.5 0.6 0.4 0.2*  AST 18 20 32 39  ALT $Re'23 18 26 'jay$ 34  ALKPHOS 69 68 55 56  PROT 6.8 6.5 6.0* 6.5  ALBUMIN 3.7 3.3* 2.5* 2.8*    Assessment and Plan:  S/p port removal --incision site clean with mild amount of bleeding onto external bandage --site redressed with  wet-to-dry dressing.   Electronically Signed: Pasty Spillers, PA 11/18/2021, 2:29 PM   I spent a total of 15 Minutes at the the patient's bedside AND on the patient's hospital floor or unit, greater than 50% of which was counseling/coordinating care for port removal site care.

## 2021-11-19 DIAGNOSIS — R509 Fever, unspecified: Secondary | ICD-10-CM | POA: Diagnosis not present

## 2021-11-19 DIAGNOSIS — R21 Rash and other nonspecific skin eruption: Secondary | ICD-10-CM | POA: Diagnosis not present

## 2021-11-19 LAB — CULTURE, BLOOD (ROUTINE X 2)
Culture: NO GROWTH
Special Requests: ADEQUATE

## 2021-11-20 ENCOUNTER — Other Ambulatory Visit (HOSPITAL_COMMUNITY): Payer: Self-pay

## 2021-11-20 ENCOUNTER — Telehealth: Payer: Self-pay | Admitting: *Deleted

## 2021-11-20 DIAGNOSIS — R509 Fever, unspecified: Secondary | ICD-10-CM | POA: Diagnosis not present

## 2021-11-20 DIAGNOSIS — R21 Rash and other nonspecific skin eruption: Secondary | ICD-10-CM | POA: Diagnosis not present

## 2021-11-20 NOTE — Telephone Encounter (Signed)
Received call from pt's husband, Virginia [name]. He states that Virginia [name] was discharged from the hospital on Saturday, 11/18/21. She now has a PICC line as her port was removed due to infection. Virginia [name] states she is getting IV antibiotics 1 x a day for 2 weeks.  Home Health is involved with this.  Virginia [name] is asking when should Virginia [name] be seen here. Advised that she has an appt on 11/29/21 for labs, Clinic visit with [name], PA and then chemo if appropriate.  Virginia [name] states that Infectious Disease told him her PICC line can be used for her chemo next week.  Virginia [name] has an appt at [facility] in the morning of 11/29/21, before she comes here. ?

## 2021-11-21 ENCOUNTER — Other Ambulatory Visit (HOSPITAL_COMMUNITY): Payer: Self-pay | Admitting: Student

## 2021-11-21 ENCOUNTER — Ambulatory Visit (HOSPITAL_COMMUNITY)
Admission: RE | Admit: 2021-11-21 | Discharge: 2021-11-21 | Disposition: A | Discharge status: Home / Self Care | Payer: BC Managed Care – PPO | Source: Ambulatory Visit | Attending: Physician Assistant | Admitting: Physician Assistant

## 2021-11-21 ENCOUNTER — Other Ambulatory Visit: Payer: Self-pay

## 2021-11-21 ENCOUNTER — Other Ambulatory Visit (HOSPITAL_COMMUNITY): Payer: Self-pay | Admitting: Physician Assistant

## 2021-11-21 DIAGNOSIS — Z4800 Encounter for change or removal of nonsurgical wound dressing: Secondary | ICD-10-CM | POA: Diagnosis not present

## 2021-11-21 DIAGNOSIS — T80219A Unspecified infection due to central venous catheter, initial encounter: Secondary | ICD-10-CM | POA: Diagnosis not present

## 2021-11-21 DIAGNOSIS — R509 Fever, unspecified: Secondary | ICD-10-CM | POA: Diagnosis not present

## 2021-11-21 DIAGNOSIS — R21 Rash and other nonspecific skin eruption: Secondary | ICD-10-CM | POA: Diagnosis not present

## 2021-11-21 DIAGNOSIS — Y828 Other medical devices associated with adverse incidents: Secondary | ICD-10-CM | POA: Diagnosis not present

## 2021-11-21 HISTORY — PX: IR PATIENT EVAL TECH 0-60 MINS: IMG5564

## 2021-11-21 NOTE — Progress Notes (Signed)
Miss Hoeppner presents to [facility] today for [facility] removal f/u.  ? ?She is s/p PAC removal due to infection on 11/17/21, the pocket was packed with iodoform gauze.  ? ?Patient laying in a stretcher comfortably, husband at bedside.  ?Reports mild tenderness when she touches the PAC site, but no othere complaints.  ? ?Site is clean and dry, packed with iodoform gauze. ?Mild inflammation noted around the incision site, no s/s of acute skin infection.  ?The gauze was remove w/o difficulty.  ?Inside of the [facility] pocket looked clean, no purulent discharge noted. ?The pocket was packed with Hydrogel. Telfa gauze and Tegaderm were placed. ? ?Patient was instructed to keep the site clean and dry, and to comeback to [facility] on Friday for follow up.  ?Patient and husband verbalized understanding.  ? ?Please call IR for questions and concerns. ? ?Tera Mater [name] ?11/21/2021 1:24 PM ? ? ?  ? ? ?

## 2021-11-22 ENCOUNTER — Encounter (HOSPITAL_COMMUNITY): Payer: Self-pay | Admitting: Radiology

## 2021-11-22 DIAGNOSIS — R509 Fever, unspecified: Secondary | ICD-10-CM | POA: Diagnosis not present

## 2021-11-22 DIAGNOSIS — R21 Rash and other nonspecific skin eruption: Secondary | ICD-10-CM | POA: Diagnosis not present

## 2021-11-22 LAB — CULTURE, BLOOD (SINGLE)
Culture: NO GROWTH
Culture: NO GROWTH
Special Requests: ADEQUATE
Special Requests: ADEQUATE

## 2021-11-22 NOTE — Procedures (Signed)
Pt was seen for attention to portacath removal site. Assisted PA with removal of Iodoform gauze and placement of hydrogel fill. New Telfa gauze and Tegaderm applied. ?

## 2021-11-23 DIAGNOSIS — R21 Rash and other nonspecific skin eruption: Secondary | ICD-10-CM | POA: Diagnosis not present

## 2021-11-23 DIAGNOSIS — R509 Fever, unspecified: Secondary | ICD-10-CM | POA: Diagnosis not present

## 2021-11-24 ENCOUNTER — Other Ambulatory Visit (HOSPITAL_COMMUNITY): Payer: Self-pay | Admitting: Student

## 2021-11-24 ENCOUNTER — Other Ambulatory Visit: Payer: Self-pay

## 2021-11-24 ENCOUNTER — Ambulatory Visit (HOSPITAL_COMMUNITY)
Admission: RE | Admit: 2021-11-24 | Discharge: 2021-11-24 | Disposition: A | Discharge status: Home / Self Care | Payer: BC Managed Care – PPO | Source: Ambulatory Visit | Attending: Student | Admitting: Student

## 2021-11-24 ENCOUNTER — Encounter (HOSPITAL_COMMUNITY): Payer: Self-pay | Admitting: Radiology

## 2021-11-24 ENCOUNTER — Encounter: Payer: Self-pay | Admitting: Hematology and Oncology

## 2021-11-24 DIAGNOSIS — T80219A Unspecified infection due to central venous catheter, initial encounter: Secondary | ICD-10-CM

## 2021-11-24 DIAGNOSIS — Z5189 Encounter for other specified aftercare: Secondary | ICD-10-CM

## 2021-11-24 DIAGNOSIS — Z4801 Encounter for change or removal of surgical wound dressing: Secondary | ICD-10-CM | POA: Diagnosis not present

## 2021-11-24 DIAGNOSIS — R509 Fever, unspecified: Secondary | ICD-10-CM | POA: Diagnosis not present

## 2021-11-24 DIAGNOSIS — R21 Rash and other nonspecific skin eruption: Secondary | ICD-10-CM | POA: Diagnosis not present

## 2021-11-24 HISTORY — PX: IR PATIENT EVAL TECH 0-60 MINS: IMG5564

## 2021-11-24 NOTE — Progress Notes (Signed)
Pt has been re enrolled in the [employer] program for [place] for $25,000 from 11/01/21 - 09/09/22.  Pt may as little as $0 per [place].  ?

## 2021-11-24 NOTE — Progress Notes (Signed)
Patient with infected port site s/p removal on 11/17/21. Wound care/site checks performed on the following dates:  ? ?3/11: old iodoform gauze removed - new iodoform placed ?3/14: old iodoform removed - hydrogel placed ? ?Today - Port site check. Patient reports moderate tenderness with palpation. Site flushed with NS and gently dried with gauze. Port pocket approximately 1 to 1.5 inches deep. Pocket is clean/pink and no overt signs of infection. Pocket was filled with hydrogel  and covered with telfa and tegaderm.  ? ?Patient will return 11/29/21 at 11 am for a repeat site check. ? Soyla Dryer, AGACNP-BC ?[phone] ?11/24/2021, 4:13 PM ?  ?

## 2021-11-24 NOTE — Procedures (Signed)
Asssisted NP Soyla Dryer with attention to port removal site wound care. Area was cleaned with normal saline, old hydrogel was removed; new hydrogel was replaced with new telfa gauze and alternative smaller Op Site Tegaderm. ?

## 2021-11-25 ENCOUNTER — Other Ambulatory Visit (HOSPITAL_COMMUNITY): Payer: Self-pay

## 2021-11-25 DIAGNOSIS — R509 Fever, unspecified: Secondary | ICD-10-CM | POA: Diagnosis not present

## 2021-11-25 DIAGNOSIS — R21 Rash and other nonspecific skin eruption: Secondary | ICD-10-CM | POA: Diagnosis not present

## 2021-11-26 DIAGNOSIS — R509 Fever, unspecified: Secondary | ICD-10-CM | POA: Diagnosis not present

## 2021-11-26 DIAGNOSIS — R21 Rash and other nonspecific skin eruption: Secondary | ICD-10-CM | POA: Diagnosis not present

## 2021-11-27 DIAGNOSIS — R509 Fever, unspecified: Secondary | ICD-10-CM | POA: Diagnosis not present

## 2021-11-27 DIAGNOSIS — R21 Rash and other nonspecific skin eruption: Secondary | ICD-10-CM | POA: Diagnosis not present

## 2021-11-27 DIAGNOSIS — T80219A Unspecified infection due to central venous catheter, initial encounter: Secondary | ICD-10-CM | POA: Diagnosis not present

## 2021-11-28 ENCOUNTER — Inpatient Hospital Stay: Payer: BC Managed Care – PPO | Admitting: Internal Medicine

## 2021-11-28 DIAGNOSIS — R21 Rash and other nonspecific skin eruption: Secondary | ICD-10-CM | POA: Diagnosis not present

## 2021-11-28 DIAGNOSIS — R509 Fever, unspecified: Secondary | ICD-10-CM | POA: Diagnosis not present

## 2021-11-29 ENCOUNTER — Telehealth: Payer: Self-pay | Admitting: *Deleted

## 2021-11-29 ENCOUNTER — Inpatient Hospital Stay: Payer: BC Managed Care – PPO | Admitting: Physician Assistant

## 2021-11-29 ENCOUNTER — Telehealth: Payer: Self-pay | Admitting: Internal Medicine

## 2021-11-29 ENCOUNTER — Other Ambulatory Visit (HOSPITAL_COMMUNITY): Payer: Self-pay

## 2021-11-29 ENCOUNTER — Other Ambulatory Visit: Payer: BC Managed Care – PPO

## 2021-11-29 ENCOUNTER — Ambulatory Visit: Payer: BC Managed Care – PPO

## 2021-11-29 ENCOUNTER — Encounter: Payer: Self-pay | Admitting: Internal Medicine

## 2021-11-29 ENCOUNTER — Other Ambulatory Visit: Payer: Self-pay | Admitting: *Deleted

## 2021-11-29 ENCOUNTER — Other Ambulatory Visit (HOSPITAL_COMMUNITY): Payer: Self-pay | Admitting: Student

## 2021-11-29 ENCOUNTER — Ambulatory Visit: Payer: BC Managed Care – PPO | Admitting: Internal Medicine

## 2021-11-29 ENCOUNTER — Ambulatory Visit (HOSPITAL_COMMUNITY)
Admission: RE | Admit: 2021-11-29 | Discharge: 2021-11-29 | Disposition: A | Discharge status: Home / Self Care | Payer: BC Managed Care – PPO | Source: Ambulatory Visit | Attending: Student | Admitting: Student

## 2021-11-29 ENCOUNTER — Inpatient Hospital Stay: Payer: BC Managed Care – PPO

## 2021-11-29 ENCOUNTER — Other Ambulatory Visit: Payer: Self-pay

## 2021-11-29 ENCOUNTER — Ambulatory Visit
Admission: RE | Admit: 2021-11-29 | Discharge: 2021-11-29 | Disposition: A | Discharge status: Home / Self Care | Payer: BC Managed Care – PPO | Source: Ambulatory Visit | Attending: Internal Medicine | Admitting: Internal Medicine

## 2021-11-29 ENCOUNTER — Ambulatory Visit (INDEPENDENT_AMBULATORY_CARE_PROVIDER_SITE_OTHER): Payer: BC Managed Care – PPO | Admitting: Internal Medicine

## 2021-11-29 ENCOUNTER — Telehealth: Payer: Self-pay

## 2021-11-29 ENCOUNTER — Inpatient Hospital Stay: Payer: BC Managed Care – PPO | Admitting: Internal Medicine

## 2021-11-29 ENCOUNTER — Ambulatory Visit: Payer: BC Managed Care – PPO | Admitting: Physician Assistant

## 2021-11-29 VITALS — BP 140/80 | HR 90 | Temp 98.0°F | Resp 12

## 2021-11-29 DIAGNOSIS — R509 Fever, unspecified: Secondary | ICD-10-CM | POA: Diagnosis not present

## 2021-11-29 DIAGNOSIS — T80212S Local infection due to central venous catheter, sequela: Secondary | ICD-10-CM | POA: Diagnosis not present

## 2021-11-29 DIAGNOSIS — R059 Cough, unspecified: Secondary | ICD-10-CM | POA: Diagnosis not present

## 2021-11-29 DIAGNOSIS — Z5189 Encounter for other specified aftercare: Secondary | ICD-10-CM

## 2021-11-29 DIAGNOSIS — C7951 Secondary malignant neoplasm of bone: Secondary | ICD-10-CM

## 2021-11-29 DIAGNOSIS — R0982 Postnasal drip: Secondary | ICD-10-CM | POA: Diagnosis not present

## 2021-11-29 DIAGNOSIS — R093 Abnormal sputum: Secondary | ICD-10-CM | POA: Diagnosis not present

## 2021-11-29 DIAGNOSIS — R21 Rash and other nonspecific skin eruption: Secondary | ICD-10-CM | POA: Diagnosis not present

## 2021-11-29 DIAGNOSIS — R5081 Fever presenting with conditions classified elsewhere: Secondary | ICD-10-CM

## 2021-11-29 DIAGNOSIS — Z09 Encounter for follow-up examination after completed treatment for conditions other than malignant neoplasm: Secondary | ICD-10-CM | POA: Diagnosis not present

## 2021-11-29 DIAGNOSIS — R051 Acute cough: Secondary | ICD-10-CM | POA: Diagnosis not present

## 2021-11-29 DIAGNOSIS — C7931 Secondary malignant neoplasm of brain: Secondary | ICD-10-CM

## 2021-11-29 HISTORY — PX: IR PATIENT EVAL TECH 0-60 MINS: IMG5564

## 2021-11-29 MED ORDER — AZITHROMYCIN 250 MG PO TABS
ORAL_TABLET | ORAL | 0 refills | Status: DC
  Filled 2021-11-29: qty 6, 5d supply, fill #0

## 2021-11-29 MED ORDER — AZITHROMYCIN 250 MG PO TABS
ORAL_TABLET | ORAL | 0 refills | Status: AC
  Filled 2021-11-29: qty 6, 5d supply, fill #0

## 2021-11-29 NOTE — Progress Notes (Addendum)
Virginia [name] presents to [facility] today for [facility] removal f/u, today is her third visit.  ? ?10/23/19: PAC placement ?11/17/21: PAC removal. Pocket packed with iodoform gauze  ?3/11: old iodoform gauze removed - new iodoform placed ?3/14: old iodoform removed - hydrogel placed ?3/17: pocket was filled with hydrogel   ?3/22:  ? ?Patient laying in a stretcher comfortably, husband at bedside.  ?Reports no issues with the port site, but she developed lowe grade fever today and she had to reschedule her chemotherapy.  ? ?Site is clean and dry, pocket is approximately 1 inch deep.  ?Pocket is clean, mild inflammation noted around the incision site, no s/s of acute skin infection.  ? ?The pocket was packed with Hydrogel. Telfa gauze and Tegaderm were placed. ? ?Patient states that her PICC will be removed this Friday after her last does of IV ABX.  ?She is currently receiving IV abx via PICC that was placed by IV team on 11/17/21.  ?She is wondering if she could keep the PICC till next Wednesday which her chemo is scheduled.  ?Reached out to Dr. Candiss Norse from [place], states that the PICC needs to be removed this Friday.  ?Reached out to Cordelia Poche from oncology team to see if the patient needs new PICC for her chemotherapy, as it appears that she will not be able to get a new PAC till first week of April. ?Asked oncology team to order a PICC placement if it is indicated.  ? ?Patient was instructed to keep the site clean and dry, and to comeback to [facility] on Monday 3/27 for follow up.  ?Patient and husband verbalized understanding.  ? ?If a new PICC is needed, will plan on placing it on Monday 3/27 while patient is at IR.  ?Please call IR for questions and concerns. ? ?Tera Mater [name] ?11/29/2021 11:52 AM ? ? ?  ? ? ?

## 2021-11-29 NOTE — Telephone Encounter (Signed)
Received call from pt's husband. He states that Virginia [name] is not feeling well today-having low grade fever of 99. He states she has a cough, coughing up "green secretions"  He is asking if she should come in today for scheduling chemo. ?Discussed with [name], PA with Dr. Lorenso Courier. She advised that pt not come in today for treatment but to see her PCP for above concerns.  ?[name] made aware of the above and agreed. ? ?Scheduling message sent for pt to have her appts rescheduled to next week. ?

## 2021-11-29 NOTE — Telephone Encounter (Signed)
Orders provided to [facility] to pull PICC after last dose on 12/01/21 per Dr. Candiss Norse. [facility] pharmacy notified.  ? ?Beryle Flock, RN ? ?

## 2021-11-29 NOTE — Patient Instructions (Signed)
Husband is  advised to take her immediately to the hospital if symptoms worsen.  We are starting her with Zithromax Z-PAK.  She has a few more days of daptomycin and Cipro for home health nurse.  She is to monitor her pulse oximetry and stay well-hydrated.  Pulse oximetry should be monitored.  Chest x-ray has been ordered.  She needs follow-up with Oncology. ?

## 2021-11-29 NOTE — Progress Notes (Addendum)
? ?Subjective:  ? ? Patient ID: Virginia [name], female    DOB: 05-25-1956, 66 y.o.   MRN: [id number] ? ?HPI Patient was admitted March 6th to  [facility] with febrile illness and a rash.Apparently had removed small amt of pus from port site and site was very tender.CXR showed no consolidation or pleural effusion. WBC was 7100.Port-a-Cath was removed by Interventional Radiology on March 10th.  A PICC line was placed for IV antibiotics.  Infectious disease consultant recommended 2 weeks of IV antibiotics from port removal with IV daptomycin and ciprofloxacin.  She had follow-up virtual visit today with Dr. Candiss Norse infectious disease consultant.  Apparently fine rash and spread on her chest with papular lesions on her legs that subsequently began to resolve.  Her white blood cell count was normal on admission.  MRSA PCR by nasal swab was not detected.  She had no leukocytosis.  Patient also complained of 3 days of URI symptoms with fever, cough and rhinorrhea with temperature up to 101 degrees.  And developed erythema and itching involving chest and all of her extremities.  Was having some intermittent diarrhea and nausea.  Chest x-ray was negative.  Venous ultrasound was negative for DVT.  White blood cell count was 7100.  Potassium was 3.0, sodium 133, Hemoglobin 12.2 g and platelet count 319,000.  Lactic acid was 1.1.  BNP was 88.3.  Peripheral blood culture was negative.  Elevated white blood cell count was thought to be due to URI/sinusitis. ? ?She was admitted from March 6 through March 11.  She is now getting home health services ? ?Interesting that she had sinusitis symptoms around the time she was hospitalized ? ?Review of Systems is here today with complaint of low-grade fever and nasal drainage.  Feels that she has a sinus infection.  Has malaise and fatigue.  Husband accompanies her today and is concerned.  No rash on her legs.  There was some belief that rash on her legs was a possible reaction  to scopolamine.  Patient reports temp of 100.1 degrees discolored sputum production.  Her cancer treatment was postponed today since she was sick.  She did have her port cleaned.  Her oncology visit  for today with PA and subsequent chemo was postponed. ? ?   ?Objective:  ? Physical Exam ?She is able to give a clear history.  She looks a bit fatigued but she is not tachypneic.  Respiratory rate is 12.  She has some coarse breath sounds in both bases.  TMs are red and dull bilaterally. ? ? ? ?   ?Assessment & Plan:  ?Acute maxillary sinusitis and lower respiratory infection consisting of discolored sputum production with low-grade fever 100.1 degrees. ?She is able to give a clear concise history.  She does not appear to be tachypneic.  She has some coarse breath sounds in both bases.  A chest x-ray has been ordered.  She is already on antibiotics per infectious disease consultant consisting of Cipro and daptomycin.  Is supposed to finish these around March 25. ? ?Bilateral otitis media ? ?Plan: It is my feeling that she has an acute maxillary sinusitis and bilateral serous otitis media.  She may also have a lower respiratory infection.  Her pulse ox is stable at the present time.  She is completing a course of Cipro and daptomycin.  It seems that she will finish these medications around March 25 which were suggested by infectious disease consultant as there was a question of  an infected port.  She is able to stay hydrated. ? ?I have decided to place her on Zithromax Z-PAK 2 tabs day 1 followed by 1 tab days 2 through 5.  Husband is to monitor her symptoms closely and take her immediately to the hospital if her symptoms worsen.  She will finish up daptomycin and Cipro as prescribed.  I will let Oncology PA know that she has been seen here today.  She is to have a chest x-ray. ? ?

## 2021-11-29 NOTE — Telephone Encounter (Signed)
After talking with Dr Renold Genta I have scheduled her to come in her today to be seen. ?

## 2021-11-29 NOTE — Progress Notes (Signed)
After further discussion with Dr. Candiss Norse, the PICC can be removed on 12/06/21 after chemotherapy.  ? ?Oncology team notified that the PICC to be removed after chemo on 3/29.  ?ID team asked to NOT remove the PICC after abx infusion this Friday.  ? ?Patient's husband Mr. Virginia [name] notified the plan via telephone.  ? ?IR will continue to follow the patient on next Monday 11:30 for [facility] site check, and will schedule new PAC placement in the near future.  ?Please call IR for questions and concerns.  ? ? ?Tera Mater [name] ?11/29/2021 1:43 PM ? ?  ? ? ?

## 2021-11-29 NOTE — Progress Notes (Signed)
? ?Subjective:  ? ? Patient ID: Virginia [name] Onset, female    DOB: 30-Apr-1956, 66 y.o.   MRN: [id number] ? ?Chief Complaint  ?Patient presents with  ? Follow-up  ? ? ? ?Virtual Visit via Telephone/Video Note ?  ?I connected withNAME@ ?on 11/29/2021 at 10:06 AM by telephone and verified that I am speaking with the correct person using two identifiers. ?  ?I discussed the limitations, risks, security and privacy concerns of performing an evaluation and management service by telephone and the availability of in person appointments. I also discussed with the patient that there may be a patient responsible charge related to this service. The patient expressed understanding and agreed to proceed. ? ?Location: ? ?Patient: Home ?Provider: [facility] ? ? ?HPI: ? ?[name] is a 66 y.o. female presents for hospital follow-up for port site infection. She was admitted for rash with concern right leg cellulitis and  found to have pustule from port site. Port site drained purulent material during hospitalization. ID was consulted and pt discharged on daptomycin and ciprofloxacin x 2 weeks from port removal. Fine rash had spread through chest with papular lesions on legs which was resolving towards discharge. Etiology was suspected 2/2 possible reaction to scopolamine.  ?Today: Pt reports Port site is healing without drainage. She regularly replaces bandage. Tolerating the daptomycin. She has an episode of sinusitis.  ? ? ?No Known Allergies ? ? ? ?Outpatient Medications Prior to Visit  ?Medication Sig Dispense Refill  ? acetaminophen (TYLENOL) 500 MG tablet Take 1,000 mg by mouth every 8 (eight) hours as needed for moderate pain.    ? albuterol (VENTOLIN HFA) 108 (90 Base) MCG/ACT inhaler INHALE 2 PUFFS INTO THE LUNGS EVERY 6 HOURS AS NEEDED FOR WHEEZING OR SHORTNESS OF BREATH (Patient taking differently: Inhale 2 puffs into the lungs every 6 (six) hours as needed for shortness of breath or wheezing.) 6.7 g 11  ?  ALPRAZolam (XANAX) 0.25 MG tablet Take 1 tablet (0.25 mg total) by mouth 2 (two) times daily as needed for anxiety 60 tablet 1  ? ciprofloxacin (CIPRO) 500 MG tablet Take 1 tablet (500 mg total) by mouth 2 (two) times daily for 14 days. End date 3/24 28 tablet 0  ? Cyanocobalamin (VITAMIN B12 PO) Take 1 tablet by mouth daily. Gummies    ? daptomycin (CUBICIN) IVPB Inject 650 mg into the vein daily for 14 days. Indication:  PAC infection ?First Dose: Yes ?Last Day of Therapy:  12/01/21 ?Labs - Once weekly:  CBC/D, BMP, and CPK ?Labs - Every other week:  ESR and CRP ?Method of administration: IV Push ?Method of administration may be changed at the discretion of home infusion pharmacist based upon assessment of the patient and/or caregiver's ability to self-administer the medication ordered. 14 Units 0  ? folic acid (FOLVITE) 1 MG tablet Take 1 tablet by mouth daily. 90 tablet 1  ? furosemide (LASIX) 20 MG tablet Take 2 tablets (40 mg total) by mouth 2 times daily. (Patient taking differently: Take 20 mg by mouth 2 (two) times daily.) 360 tablet 0  ? meclizine (ANTIVERT) 25 MG tablet Take 1 tablet (25 mg total) by mouth 3 (three) times daily as needed for dizziness. 30 tablet 0  ? Multiple Vitamins-Minerals (AIRBORNE PO) Take 1 tablet by mouth daily.     ? ofloxacin (OCUFLOX) 0.3 % ophthalmic solution 2 drops each eye 4 times a day for 5 days (Patient taking differently: Place 2 drops into both eyes daily  as needed (irritation).) 5 mL 1  ? OLANZapine (ZYPREXA) 2.5 MG tablet TAKE 1 TABLET BY MOUTH EVERY NIGHT AT BEDTIME 90 tablet 1  ? ondansetron (ZOFRAN) 4 MG tablet TAKE 1 TO 2 TABLETS BY MOUTH 2 TIMES DAILY AS NEEDED (Patient taking differently: Take 4-8 mg by mouth 2 (two) times daily as needed for nausea or vomiting.) 60 tablet 0  ? oxyCODONE (OXY IR/ROXICODONE) 5 MG immediate release tablet TAKE 1 TO 2 TABLETS BY MOUTH EVERY 4 HOURS AS NEEDED FOR SEVERE PAIN 90 tablet 0  ? pantoprazole (PROTONIX) 40 MG tablet TAKE 1  TABLET BY MOUTH ONCE DAILY 90 tablet 3  ? polyethylene glycol (MIRALAX / GLYCOLAX) 17 g packet Take 17 g by mouth every evening.    ? potassium chloride SA (KLOR-CON M) 20 MEQ tablet Take 2 tablets in the morning and 1 tablet in the evening. (Patient taking differently: 20 mEq 2 (two) times daily.) 90 tablet 2  ? Vitamin D3 (VITAMIN D) 25 MCG tablet Take 2,000 Units by mouth every evening.    ? vitamin E 180 MG (400 UNITS) capsule Take 400 IU daily x 1 week, then 400 IU BID (Patient taking differently: Take 400 Units by mouth daily.) 60 capsule 4  ? XTAMPZA ER 13.5 MG C12A Take 1 capsule by mouth in the morning and at bedtime. (every 12 hours) 60 capsule 0  ? diphenhydrAMINE (BENADRYL) 50 MG tablet Take 1 tablet by mouth at bedtime for 8 days. (Patient not taking: Reported on 11/29/2021) 8 tablet 0  ? EPINEPHrine 0.3 mg/0.3 mL IJ SOAJ injection Inject 0.3 mg into the muscle as needed for anaphylaxis. (Patient not taking: Reported on 11/29/2021) 2 each 0  ? fexofenadine (ALLEGRA ALLERGY) 180 MG tablet Take 1 tablet by mouth daily for 8 days. (Patient not taking: Reported on 11/29/2021) 8 tablet 0  ? lidocaine-prilocaine (EMLA) cream Apply to port as needed. (Patient not taking: Reported on 11/29/2021) 30 g 0  ? predniSONE (DELTASONE) 10 MG tablet Take 4 tablets by mouth daily for 2 days then decrease by 1 tablet every other day (4-4-3-3-2-2-1-1) (Patient not taking: Reported on 11/29/2021) 30 tablet 0  ? ?No facility-administered medications prior to visit.  ? ? ? ?Past Medical History:  ?Diagnosis Date  ? Anemia   ? Anxiety   ? Concussion 09/28/2019  ? DVT (deep venous thrombosis) ([place]) 2021  ? L leg  ? Dyspnea   ? GERD (gastroesophageal reflux disease)   ? Hypercholesterolemia   ? per pt, she does not have elevated lipids  ? Hypertension   ? 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 PONV (postoperative nausea and vomiting)   ? Tobacco abuse   ? ? ? ?Past Surgical History:  ?Procedure Laterality Date  ?  ABDOMINAL HYSTERECTOMY    ? partial/ left ovaries  ? APPLICATION OF CRANIAL NAVIGATION N/A 12/02/2020  ? Procedure: APPLICATION OF CRANIAL NAVIGATION;  Surgeon: Erline Levine, MD;  Location: [place];  Service: Neurosurgery;  Laterality: N/A;  ? CHOLECYSTECTOMY    ? CRANIOTOMY N/A 12/02/2020  ? Procedure: Posterior fossa craniotomy for tumor resection with brainlab;  Surgeon: Erline Levine, MD;  Location: [place];  Service: Neurosurgery;  Laterality: N/A;  ? DILATION AND CURETTAGE OF UTERUS    ? IR IMAGING GUIDED PORT INSERTION  10/23/2019  ? IR PATIENT EVAL TECH 0-60 MINS  11/21/2021  ? IR PATIENT EVAL TECH 0-60 MINS  11/24/2021  ? IR  REMOVAL TUN ACCESS W/ PORT W/O FL MOD SED  11/17/2021  ? KYPHOPLASTY N/A 03/15/2020  ? Procedure: Thoracic Eight KYPHOPLASTY;  Surgeon: Erline Levine, MD;  Location: [place];  Service: Neurosurgery;  Laterality: N/A;  prone ?  ? LIPOMA EXCISION  2018  ? removed under left breast and right thigh.  ? TUBAL LIGATION    ? ? ? ? ? ?Review of Systems  ?All other systems reviewed and are negative. ?   ?Objective:  ?  ? ?   ?Assessment & Plan:  ? ? ?I am having Virginia [name]. Virginia [name] "[name]" maintain her Vitamin D3, polyethylene glycol, Cyanocobalamin (VITAMIN B12 PO), Multiple Vitamins-Minerals (AIRBORNE PO), ofloxacin, vitamin E, acetaminophen, albuterol, ondansetron, lidocaine-prilocaine, OLANZapine, pantoprazole, ALPRAZolam, meclizine, folic acid, oxyCODONE, furosemide, potassium chloride SA, Xtampza ER, EPINEPHrine, predniSONE, diphenhydrAMINE, fexofenadine, daptomycin, and ciprofloxacin. ? ? ?No orders of the defined types were placed in this encounter. ? ?#Port site infection SP port removal on 11/17/21 ?-Blood cx from admission (peripheral) no growth. Unable to obtain Cx from port due to lack of blood return. ?-Discharged on 2 weeks of antibiotics with dapto from PICC being pulled EOT 12/01/21 ?-Last set of labs 3/13 WBC 12.5, SCr 0.65, ESR 61, CRP 10.4. 3/20 labs WBC 13.4. Elevated wbc can be explained  by recent URI/sinusitis episode.  ?Plan:  ?-Complete daptomycin and ciprofloxacin on EOT 3/24 ?-Pull PICC ?-Follow-up PRN ? ?I discussed the assessment and treatment plan with the patient. The patient was pro

## 2021-11-29 NOTE — Telephone Encounter (Signed)
Virginia [name] ?[id number] ? ?Virginia [name] called to say that Virginia [name] has low grade fever this morning 100.1, coughing up green stuff and runny nose. She was unable to have treatment at cancer center today except for having port cleaned. ?

## 2021-11-29 NOTE — Telephone Encounter (Signed)
Dr. Candiss Norse spoke with Virginia File, PA with radiology. Patient will need to have PICC remain in place through 12/06/21 for chemo treatment. Notified [facility] to leave PICC in place as oncology team will remove PICC after her chemo. [facility] updated. [name] reports she notified patient of plan.  ? ?Beryle Flock, RN ? ?

## 2021-11-30 ENCOUNTER — Other Ambulatory Visit: Payer: Self-pay | Admitting: Internal Medicine

## 2021-11-30 ENCOUNTER — Other Ambulatory Visit (HOSPITAL_COMMUNITY): Payer: BC Managed Care – PPO

## 2021-11-30 ENCOUNTER — Telehealth: Payer: Self-pay

## 2021-11-30 ENCOUNTER — Other Ambulatory Visit (HOSPITAL_COMMUNITY): Payer: Self-pay | Admitting: Student

## 2021-11-30 ENCOUNTER — Encounter (HOSPITAL_COMMUNITY): Payer: Self-pay

## 2021-11-30 DIAGNOSIS — R509 Fever, unspecified: Secondary | ICD-10-CM | POA: Diagnosis not present

## 2021-11-30 DIAGNOSIS — R21 Rash and other nonspecific skin eruption: Secondary | ICD-10-CM | POA: Diagnosis not present

## 2021-11-30 NOTE — Addendum Note (Signed)
Addended by: Elby Showers on: 11/30/2021 10:50 PM ? ? Modules accepted: Level of Service ? ?

## 2021-11-30 NOTE — Procedures (Signed)
Assisted [name] Han, [name] with PAC removal f/u. Site was checked and flushed with sterile normal saline and pocket filled with hydrogel. Site was clean and clear of infection. ?

## 2021-11-30 NOTE — Telephone Encounter (Signed)
poke with patient's husband and he states that he feels she has improved. Her temp today was 99.6 but came down with tylenol. She is still very congested. Was doing breathing exercises to try and get the congestion up. He will call if anything is needed.  ?

## 2021-12-01 DIAGNOSIS — R21 Rash and other nonspecific skin eruption: Secondary | ICD-10-CM | POA: Diagnosis not present

## 2021-12-01 DIAGNOSIS — R509 Fever, unspecified: Secondary | ICD-10-CM | POA: Diagnosis not present

## 2021-12-04 ENCOUNTER — Ambulatory Visit (HOSPITAL_COMMUNITY)
Admission: RE | Admit: 2021-12-04 | Discharge: 2021-12-04 | Disposition: A | Discharge status: Home / Self Care | Payer: BC Managed Care – PPO | Source: Ambulatory Visit | Attending: Student | Admitting: Student

## 2021-12-04 ENCOUNTER — Telehealth: Payer: Self-pay | Admitting: *Deleted

## 2021-12-04 ENCOUNTER — Encounter (HOSPITAL_COMMUNITY): Payer: Self-pay | Admitting: Radiology

## 2021-12-04 ENCOUNTER — Other Ambulatory Visit: Payer: Self-pay

## 2021-12-04 DIAGNOSIS — Z4800 Encounter for change or removal of nonsurgical wound dressing: Secondary | ICD-10-CM | POA: Diagnosis not present

## 2021-12-04 DIAGNOSIS — Z5189 Encounter for other specified aftercare: Secondary | ICD-10-CM

## 2021-12-04 HISTORY — PX: IR PATIENT EVAL TECH 0-60 MINS: IMG5564

## 2021-12-04 NOTE — Telephone Encounter (Signed)
Received call from pt's husband. He is asking if Virginia [name] come in for her appts on  ?

## 2021-12-04 NOTE — Procedures (Signed)
I assisted the NP Soyla Dryer in her evaluation of the site in which the pt used to have her port-a-cath. I peeled off the existing gauze and tegederm dressing. Jamie then irrigated the pocket with saline. The site was then wiped dry and re-filled with Hydrogel. Finally, I re-dressed the site with a telfa and tegederm dressing. ?

## 2021-12-04 NOTE — Progress Notes (Signed)
Virginia [name] presents to [facility] IR today for [facility] removal follow up/wound care   ?  ?10/23/19: PAC placement ?11/17/21: PAC removal. Pocket packed with iodoform gauze  ?3/11: old iodoform gauze removed - new iodoform placed ?3/14: old iodoform removed - hydrogel placed ?3/17: pocket was filled with hydrogel   ?3/22: pocket filled with hydrogel  ? ?3/27:  ?Patient laying in a stretcher comfortably, husband at bedside.  ?Reports no issues with the port site. She is feeling well and has no complaints today. Site is clean and dry, pocket is approximately 3/4 to 1 inch deep. The site is without erythema, drainage or tenderness. The pocket was irrigated with 30 ml of NS and gently dried with gauze. The pocket was packed with hydrogel and covered with telfa gauze/tegaderm.  ? ?The patient will return next Tuesday, 12/12/21 for new port-a-catheter placement and IR will perform wound care on the old port site at that time.  ? Soyla Dryer, AGACNP-BC ?[phone] ?12/04/2021, 1:00 PM ? ? ? ?

## 2021-12-04 NOTE — Telephone Encounter (Signed)
12/06/21. She is recovering from an URI infection. [name] states that yesterday and today are the first days she has not had a fever.  Advised that she needs to come in for labs and to be seen by Dr. Lorenso Courier. He will then decide if she is up to treatment that day or not. ?She still has her PICC line. She will keep this until she gets a new port placed on 12/11/21. IR will removed the PICC when they put her new port in.  We can use the PICC line for her treatment on 12/06/21 ?

## 2021-12-05 ENCOUNTER — Ambulatory Visit: Payer: BC Managed Care – PPO | Admitting: Physical Therapy

## 2021-12-05 ENCOUNTER — Encounter (HOSPITAL_BASED_OUTPATIENT_CLINIC_OR_DEPARTMENT_OTHER): Payer: BC Managed Care – PPO | Admitting: Internal Medicine

## 2021-12-06 ENCOUNTER — Other Ambulatory Visit: Payer: Self-pay

## 2021-12-06 ENCOUNTER — Inpatient Hospital Stay: Payer: BC Managed Care – PPO

## 2021-12-06 ENCOUNTER — Inpatient Hospital Stay (HOSPITAL_BASED_OUTPATIENT_CLINIC_OR_DEPARTMENT_OTHER): Payer: BC Managed Care – PPO | Admitting: Hematology and Oncology

## 2021-12-06 VITALS — BP 121/87 | HR 122 | Temp 97.6°F | Resp 17 | Wt 165.0 lb

## 2021-12-06 DIAGNOSIS — Z5112 Encounter for antineoplastic immunotherapy: Secondary | ICD-10-CM

## 2021-12-06 DIAGNOSIS — M549 Dorsalgia, unspecified: Secondary | ICD-10-CM | POA: Diagnosis not present

## 2021-12-06 DIAGNOSIS — Z79899 Other long term (current) drug therapy: Secondary | ICD-10-CM | POA: Diagnosis not present

## 2021-12-06 DIAGNOSIS — Z95828 Presence of other vascular implants and grafts: Secondary | ICD-10-CM

## 2021-12-06 DIAGNOSIS — E032 Hypothyroidism due to medicaments and other exogenous substances: Secondary | ICD-10-CM

## 2021-12-06 DIAGNOSIS — E876 Hypokalemia: Secondary | ICD-10-CM | POA: Diagnosis not present

## 2021-12-06 DIAGNOSIS — C7931 Secondary malignant neoplasm of brain: Secondary | ICD-10-CM

## 2021-12-06 DIAGNOSIS — Z9049 Acquired absence of other specified parts of digestive tract: Secondary | ICD-10-CM | POA: Diagnosis not present

## 2021-12-06 DIAGNOSIS — R110 Nausea: Secondary | ICD-10-CM | POA: Diagnosis not present

## 2021-12-06 DIAGNOSIS — Z5111 Encounter for antineoplastic chemotherapy: Secondary | ICD-10-CM | POA: Diagnosis not present

## 2021-12-06 DIAGNOSIS — R918 Other nonspecific abnormal finding of lung field: Secondary | ICD-10-CM

## 2021-12-06 DIAGNOSIS — Z86718 Personal history of other venous thrombosis and embolism: Secondary | ICD-10-CM | POA: Diagnosis not present

## 2021-12-06 DIAGNOSIS — Z7901 Long term (current) use of anticoagulants: Secondary | ICD-10-CM | POA: Diagnosis not present

## 2021-12-06 DIAGNOSIS — C3431 Malignant neoplasm of lower lobe, right bronchus or lung: Secondary | ICD-10-CM | POA: Diagnosis not present

## 2021-12-06 DIAGNOSIS — R42 Dizziness and giddiness: Secondary | ICD-10-CM | POA: Diagnosis not present

## 2021-12-06 DIAGNOSIS — Z923 Personal history of irradiation: Secondary | ICD-10-CM | POA: Diagnosis not present

## 2021-12-06 DIAGNOSIS — R062 Wheezing: Secondary | ICD-10-CM | POA: Diagnosis not present

## 2021-12-06 DIAGNOSIS — R140 Abdominal distension (gaseous): Secondary | ICD-10-CM | POA: Diagnosis not present

## 2021-12-06 DIAGNOSIS — R590 Localized enlarged lymph nodes: Secondary | ICD-10-CM | POA: Diagnosis not present

## 2021-12-06 LAB — CMP (CANCER CENTER ONLY)
ALT: 26 U/L (ref 0–44)
AST: 31 U/L (ref 15–41)
Albumin: 3.1 g/dL — ABNORMAL LOW (ref 3.5–5.0)
Alkaline Phosphatase: 59 U/L (ref 38–126)
Anion gap: 7 (ref 5–15)
BUN: 8 mg/dL (ref 8–23)
CO2: 30 mmol/L (ref 22–32)
Calcium: 9.4 mg/dL (ref 8.9–10.3)
Chloride: 102 mmol/L (ref 98–111)
Creatinine: 0.77 mg/dL (ref 0.44–1.00)
GFR, Estimated: >60 mL/min (ref >60)
Glucose, Bld: 120 mg/dL — ABNORMAL HIGH (ref 70–99)
Potassium: 4.2 mmol/L (ref 3.5–5.1)
Sodium: 139 mmol/L (ref 135–145)
Total Bilirubin: 0.6 mg/dL (ref 0.3–1.2)
Total Protein: 6.6 g/dL (ref 6.5–8.1)

## 2021-12-06 LAB — LACTATE DEHYDROGENASE: LDH: 202 U/L — ABNORMAL HIGH (ref 98–192)

## 2021-12-06 LAB — CBC WITH DIFFERENTIAL (CANCER CENTER ONLY)
Abs Immature Granulocytes: 0.02 10*3/uL (ref 0.00–0.07)
Basophils Absolute: 0 10*3/uL (ref 0.0–0.1)
Basophils Relative: 0 %
Eosinophils Absolute: 0.5 10*3/uL (ref 0.0–0.5)
Eosinophils Relative: 8 %
HCT: 35.3 % — ABNORMAL LOW (ref 36.0–46.0)
Hemoglobin: 12.2 g/dL (ref 12.0–15.0)
Immature Granulocytes: 0 %
Lymphocytes Relative: 32 %
Lymphs Abs: 2.2 10*3/uL (ref 0.7–4.0)
MCH: 34.2 pg — ABNORMAL HIGH (ref 26.0–34.0)
MCHC: 34.6 g/dL (ref 30.0–36.0)
MCV: 98.9 fL (ref 80.0–100.0)
Monocytes Absolute: 0.9 10*3/uL (ref 0.1–1.0)
Monocytes Relative: 13 %
Neutro Abs: 3.2 10*3/uL (ref 1.7–7.7)
Neutrophils Relative %: 47 %
Platelet Count: 187 10*3/uL (ref 150–400)
RBC: 3.57 MIL/uL — ABNORMAL LOW (ref 3.87–5.11)
RDW: 13.5 % (ref 11.5–15.5)
WBC Count: 6.9 10*3/uL (ref 4.0–10.5)
nRBC: 0 % (ref 0.0–0.2)

## 2021-12-06 LAB — TSH: TSH: 2.263 u[IU]/mL (ref 0.308–3.960)

## 2021-12-06 MED ORDER — HEPARIN SOD (PORK) LOCK FLUSH 100 UNIT/ML IV SOLN
250.0000 [IU] | Freq: Once | INTRAVENOUS | Status: AC | PRN
  Administered 2021-12-06: 250 [IU]

## 2021-12-06 MED ORDER — SODIUM CHLORIDE 0.9 % IV SOLN: Freq: Once | INTRAVENOUS | Status: AC

## 2021-12-06 MED ORDER — SODIUM CHLORIDE 0.9 % IV SOLN
200.0000 mg | Freq: Once | INTRAVENOUS | Status: AC
  Administered 2021-12-06: 200 mg via INTRAVENOUS
  Filled 2021-12-06: qty 200

## 2021-12-06 MED ORDER — SODIUM CHLORIDE 0.9 % IV SOLN
500.0000 mg/m2 | Freq: Once | INTRAVENOUS | Status: AC
  Administered 2021-12-06: 900 mg via INTRAVENOUS
  Filled 2021-12-06: qty 20

## 2021-12-06 MED ORDER — SODIUM CHLORIDE 0.9% FLUSH
10.0000 mL | INTRAVENOUS | Status: AC | PRN
  Administered 2021-12-06: 10 mL

## 2021-12-06 MED ORDER — PROCHLORPERAZINE MALEATE 10 MG PO TABS
10.0000 mg | ORAL_TABLET | Freq: Once | ORAL | Status: AC
  Administered 2021-12-06: 10 mg via ORAL
  Filled 2021-12-06: qty 1

## 2021-12-06 MED ORDER — SODIUM CHLORIDE 0.9% FLUSH
10.0000 mL | INTRAVENOUS | Status: DC | PRN
  Administered 2021-12-06: 10 mL

## 2021-12-06 NOTE — Progress Notes (Signed)
?[name] ?[phone]   [phone] ? ?PROGRESS NOTE ? ?Patient Care Team: ?Elby Showers, MD as PCP - General (Internal Medicine) ?Orson Slick, MD as PCP - Hematology/Oncology (Hematology and Oncology) ?Valrie Hart, RN as Oncology Nurse Navigator ?Acquanetta Chain, DO as Consulting Physician [facility] and Palliative Medicine) ? 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 CT of the head showed showed concerning for metastatic disease involving the left cerebellum and right occipital lobe.  ?2) 09/30/2019: MRI brain confirms mass within the left cerebellum measures 1.6 x 1.3 x 1.5 cm as well as at least 8 metastatic lesions within the supratentorial and infratentorial brain as outlined ?3) 10/01/2019: PET CT scan revealed hypermetabolic infrahilar right lower lobe mass with ?hypermetabolic nodal metastases in the right hilum, mediastinum, right supraclavicular region, left axilla and lower left neck. ?4) 10/08/2019: US guided biopsy of the lymph nodes confirms poorly differentiated non-small cell carcinoma. Immunohistochemistry confirms adenocarcinoma of lung primary. PD-L1 and NGS later revealed a MET Exon 14 mutation and TPS of 90%.  ?5) 10/12/2019: Establish care with Dr. Lorenso Courier  ?6) 11/04/2019: Day 1 of capmatinib 443m BID ?7) 11/09/2019: presented to Rad/Onc with a DVT in LLE. Seen in symptom management clinic and started on apixaban.  ?8) 12/29/2019: CT C/A/P showed interval decrease in size of mass involving the superior segment of right lower lobe and reduction in mediastinal and left supraclavicular adenopathy though some small spinal lesions were noted in the interim between the two sets of scans ?9) 01/25/2020: temporarily stopped capmatinib due to symptoms of nausea/fatigue. Restarted therapy on 01/30/2020 after brief chemo holiday.  ?10)  03/22/2020: CT C/A/P reveals a mixed picture, with new lung nodule and increased lymphadenopathy, however response in bone lesions. D/c capmatinib therapy ?11) 04/08/2020: start of Carbo/Pem/Pem for 2nd line treatment. Cycle 1 Day 1   ?12) 04/29/2020: Cycle 2 Day 1 Carbo/Pem/Pem  ?13) 05/20/2020: Cycle 3 Day 1 Carbo/Pem/Pem  ?14) 06/09/2020: Cycle 4 Day 1 Carbo/Pem/Pem  ?15) 06/29/2020: Cycle 5 Day 1 maintenance Pem/Pem  ?16) 07/20/2020: Cycle 6 Day 1 maintenance Pem/Pem  ?17) 08/12/2020: Cycle 7 Day 1 maintenance Pem/Pem  ?18) 09/01/2020: Cycle 8 Day 1 maintenance Pem/Pem  ?19) 09/21/2020: Cycle 9 Day 1 maintenance Pem/Pem  ?20) 10/13/2020: Cycle 10 Day 1 maintenance Pem/Pem  ?21) 11/03/2020: Cycle 11 Day 1 maintenance Pem/Pem  ?22) 11/25/2020:  Cycle 12 Day 1 maintenance Pem/Pem ?23) 12/15/2020:  Cycle 13 Day 1 maintenance Pem/Pem ?24) 01/05/2021:  Cycle 14 Day 1 maintenance Pem/Pem ?25) 01/26/2021:  Cycle 15 Day 1 maintenance Pem/Pem plus Bevacizumab.  ?02/17/2021: Cycle 16 Day 1 maintenance Pem/Pem plus Bevacizumab.  ?03/10/2021: Cycle 17 Day 1 maintenance Pem/Pem plus Bevacizumab.  ?03/31/2021: Cycle 18 Day 1 maintenance Pem/Pem  ?04/20/2021:  Cycle 19 Day 1 maintenance Pem/Pem  ?05/12/2021: Cycle 20 Day 1 maintenance Pem/Pem ?06/01/2021: Cycle 21 Day 1 maintenance Pem/Pem ?06/22/2021: Cycle 22 Day 1 maintenance Pem/Pem ?07/13/2021: Cycle 23 Day 1 maintenance Pem/Pem ?08/16/2021: Cycle 24 Day 1 maintenance Pem/Pem ?09/08/2021: Cycle 25 Day 1 maintenance Pem/Pem plus Bevacizumab ?09/28/2021: Cycle 26 Day 1 maintenance Pem/Pem plus Bevacizumab ?10/20/2021: Cycle 27 Day 1 maintenance Pem/Pem plus Bevacizumab ?11/09/2021: Cycle 28 Day 1 maintenance Pem/Pem plus Bevacizumab ?12/06/2021: Cycle 29 Day 1 maintenance Pem/Pem plus Bevacizumab ? ?Interval History:  ?Virginia [age]DS89y.o. female with medical history significant for metastatic adenocarcinoma of  the lung presents for a follow up visit. The patient's last visit was on 10/20/2021.  In  the interim since the last visit, she was briefly admitted to the hospital for a rash on her lower extremities thought to be a drug rash. ? ?On exam today Virginia [name] is accompanied by her husband.  She reports she has had near complete resolution of the rash for which she was previously admitted.  She notes that her port has been pulled and replaced with a PICC but that she will be doing a swab here shortly.  As she is having some drying this of the skin and some itching but otherwise no major issues with her skin.  She reports that she did unfortunately have a cough with some productive sputum and was recently given a Z-Pak in order to help with this.  She does that her pain is currently under good control.  She does occasionally have bouts of shortness of breath but her inhalers have been effective in treating this.  She denies any fevers, chills, night sweats, chest pain, cough, rash or neuropathy. She has no other complaints. Rest of the 10 point ROS is below.  She is willing and able to proceed with treatment at this time. ? ?MEDICAL HISTORY:  ?Past Medical History:  ?Diagnosis Date  ? Anemia   ? Anxiety   ? Concussion 09/28/2019  ? DVT (deep venous thrombosis) ([place]) 2021  ? L leg  ? Dyspnea   ? GERD (gastroesophageal reflux disease)   ? Hypercholesterolemia   ? per pt, she does not have elevated lipids  ? Hypertension   ? 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 PONV (postoperative nausea and vomiting)   ? Tobacco abuse   ? ? ?SURGICAL HISTORY: ?Past Surgical History:  ?Procedure Laterality Date  ? ABDOMINAL HYSTERECTOMY    ? partial/ left ovaries  ? APPLICATION OF CRANIAL NAVIGATION N/A 12/02/2020  ? Procedure: APPLICATION OF CRANIAL NAVIGATION;  Surgeon: Erline Levine, MD;  Location: [facility];  Service: Neurosurgery;  Laterality: N/A;  ? CHOLECYSTECTOMY    ? CRANIOTOMY N/A 12/02/2020  ? Procedure: Posterior fossa craniotomy for tumor resection with brainlab;  Surgeon: Erline Levine, MD;   Location: [facility];  Service: Neurosurgery;  Laterality: N/A;  ? DILATION AND CURETTAGE OF UTERUS    ? IR IMAGING GUIDED PORT INSERTION  10/23/2019  ? IR PATIENT EVAL TECH 0-60 MINS  11/21/2021  ? IR PATIENT EVAL TECH 0-60 MINS  11/24/2021  ? IR PATIENT EVAL TECH 0-60 MINS  11/29/2021  ? IR PATIENT EVAL TECH 0-60 MINS  12/04/2021  ? IR REMOVAL TUN ACCESS W/ PORT W/O FL MOD SED  11/17/2021  ? KYPHOPLASTY N/A 03/15/2020  ? Procedure: Thoracic Eight KYPHOPLASTY;  Surgeon: Erline Levine, MD;  Location: [facility];  Service: Neurosurgery;  Laterality: N/A;  prone ?  ? LIPOMA EXCISION  2018  ? removed under left breast and right thigh.  ? TUBAL LIGATION    ? ? ?ALLERGIES:  has No Known Allergies. ? ?MEDICATIONS:  ?Current Outpatient Medications  ?Medication Sig Dispense Refill  ? lidocaine-prilocaine (EMLA) cream Apply to port as needed. 30 g 0  ? acetaminophen (TYLENOL) 500 MG tablet Take 1,000 mg by mouth every 8 (eight) hours as needed for moderate pain.    ? albuterol (VENTOLIN HFA) 108 (90 Base) MCG/ACT inhaler INHALE 2 PUFFS INTO THE LUNGS EVERY 6 HOURS AS NEEDED FOR WHEEZING OR SHORTNESS OF  BREATH 6.7 g 11  ? ALPRAZolam (XANAX) 0.25 MG tablet Take 1 tablet (0.25 mg total) by mouth 2 (two) times daily as needed for anxiety 60 tablet 1  ? Cyanocobalamin (VITAMIN B12 PO) Take 1 tablet by mouth daily. Gummies    ? EPINEPHrine 0.3 mg/0.3 mL IJ SOAJ injection Inject 0.3 mg into the muscle as needed for anaphylaxis. (Patient not taking: Reported on 6/86/1042) 2 each 0  ? folic acid (FOLVITE) 1 MG tablet Take 1 tablet by mouth daily. 90 tablet 1  ? furosemide (LASIX) 20 MG tablet Take 2 tablets (40 mg total) by mouth 2 times daily. (Patient taking differently: Take 20 mg by mouth 2 (two) times daily.) 360 tablet 0  ? meclizine (ANTIVERT) 25 MG tablet Take 1 tablet (25 mg total) by mouth 3 (three) times daily as needed for dizziness. 30 tablet 0  ? Multiple Vitamins-Minerals (AIRBORNE PO) Take 1 tablet by mouth daily.     ? ofloxacin  (OCUFLOX) 0.3 % ophthalmic solution 2 drops each eye 4 times a day for 5 days (Patient taking differently: Place 2 drops into both eyes daily as needed (irritation).) 5 mL 1  ? OLANZapine (ZYPREXA) 2.5 MG tablet

## 2021-12-06 NOTE — Progress Notes (Signed)
OK to treat with HR of 110, per Dr. Lorenso Courier. ?

## 2021-12-06 NOTE — Patient Instructions (Signed)
[facility]  Discharge Instructions: ?Thank you for choosing [facility] to provide your oncology and hematology care.  ? ?If you have a lab appointment with the [facility], please go directly to the [facility] and check in at the registration area. ?  ?Wear comfortable clothing and clothing appropriate for easy access to any Portacath or PICC line.  ? ?We strive to give you quality time with your provider. You may need to reschedule your appointment if you arrive late (15 or more minutes).  Arriving late affects you and other patients whose appointments are after yours.  Also, if you miss three or more appointments without notifying the office, you may be dismissed from the clinic at the provider?s discretion.    ?  ?For prescription refill requests, have your pharmacy contact our office and allow 72 hours for refills to be completed.   ? ?Today you received the following chemotherapy and/or immunotherapy agents: Alimta & Keytruda    ?  ?To help prevent nausea and vomiting after your treatment, we encourage you to take your nausea medication as directed. ? ?BELOW ARE SYMPTOMS THAT SHOULD BE REPORTED IMMEDIATELY: ?*FEVER GREATER THAN 100.4 F (38 ?C) OR HIGHER ?*CHILLS OR SWEATING ?*NAUSEA AND VOMITING THAT IS NOT CONTROLLED WITH YOUR NAUSEA MEDICATION ?*UNUSUAL SHORTNESS OF BREATH ?*UNUSUAL BRUISING OR BLEEDING ?*URINARY PROBLEMS (pain or burning when urinating, or frequent urination) ?*BOWEL PROBLEMS (unusual diarrhea, constipation, pain near the anus) ?TENDERNESS IN MOUTH AND THROAT WITH OR WITHOUT PRESENCE OF ULCERS (sore throat, sores in mouth, or a toothache) ?UNUSUAL RASH, SWELLING OR PAIN  ?UNUSUAL VAGINAL DISCHARGE OR ITCHING  ? ?Items with * indicate a potential emergency and should be followed up as soon as possible or go to the Emergency Department if any problems should occur. ? ?Please show the CHEMOTHERAPY ALERT CARD or IMMUNOTHERAPY ALERT CARD at  check-in to the Emergency Department and triage nurse. ? ?Should you have questions after your visit or need to cancel or reschedule your appointment, please contact [facility]  Dept: [phone]  and follow the prompts.  Office hours are 8:00 a.m. to 4:30 p.m. Monday - Friday. Please note that voicemails left after 4:00 p.m. may not be returned until the following business day.  We are closed weekends and major holidays. You have access to a nurse at all times for urgent questions. Please call the main number to the clinic Dept: [phone] and follow the prompts. ? ? ?For any non-urgent questions, you may also contact your provider using MyChart. We now offer e-Visits for anyone 70 and older to request care online for non-urgent symptoms. For details visit [email or web address]. ?  ?Also download the MyChart app! Go to the app store, search "MyChart", open the app, select [facility], and log in with your MyChart username and password. ? ?Due to Covid, a mask is required upon entering the hospital/clinic. If you do not have a mask, one will be given to you upon arrival. For doctor visits, patients may have 1 support Raynelle Fujikawa aged 23 or older with them. For treatment visits, patients cannot have anyone with them due to current Covid guidelines and our immunocompromised population.  ? ?

## 2021-12-10 ENCOUNTER — Encounter: Payer: Self-pay | Admitting: Hematology and Oncology

## 2021-12-11 ENCOUNTER — Ambulatory Visit (HOSPITAL_COMMUNITY)
Admission: RE | Admit: 2021-12-11 | Discharge: 2021-12-11 | Disposition: A | Discharge status: Home / Self Care | Payer: BC Managed Care – PPO | Source: Ambulatory Visit | Attending: Hematology and Oncology | Admitting: Hematology and Oncology

## 2021-12-11 ENCOUNTER — Other Ambulatory Visit: Payer: Self-pay | Admitting: Radiology

## 2021-12-11 DIAGNOSIS — L539 Erythematous condition, unspecified: Secondary | ICD-10-CM | POA: Insufficient documentation

## 2021-12-11 DIAGNOSIS — R918 Other nonspecific abnormal finding of lung field: Secondary | ICD-10-CM | POA: Diagnosis not present

## 2021-12-11 DIAGNOSIS — Y842 Radiological procedure and radiotherapy as the cause of abnormal reaction of the patient, or of later complication, without mention of misadventure at the time of the procedure: Secondary | ICD-10-CM | POA: Diagnosis not present

## 2021-12-11 DIAGNOSIS — R911 Solitary pulmonary nodule: Secondary | ICD-10-CM | POA: Diagnosis not present

## 2021-12-11 DIAGNOSIS — Z79899 Other long term (current) drug therapy: Secondary | ICD-10-CM | POA: Insufficient documentation

## 2021-12-11 DIAGNOSIS — J701 Chronic and other pulmonary manifestations due to radiation: Secondary | ICD-10-CM | POA: Insufficient documentation

## 2021-12-11 DIAGNOSIS — C7951 Secondary malignant neoplasm of bone: Secondary | ICD-10-CM | POA: Insufficient documentation

## 2021-12-11 DIAGNOSIS — C3490 Malignant neoplasm of unspecified part of unspecified bronchus or lung: Secondary | ICD-10-CM | POA: Diagnosis not present

## 2021-12-11 DIAGNOSIS — R600 Localized edema: Secondary | ICD-10-CM | POA: Diagnosis not present

## 2021-12-11 DIAGNOSIS — C7931 Secondary malignant neoplasm of brain: Secondary | ICD-10-CM | POA: Diagnosis not present

## 2021-12-11 DIAGNOSIS — Z923 Personal history of irradiation: Secondary | ICD-10-CM | POA: Insufficient documentation

## 2021-12-11 DIAGNOSIS — J9811 Atelectasis: Secondary | ICD-10-CM | POA: Diagnosis not present

## 2021-12-11 DIAGNOSIS — J941 Fibrothorax: Secondary | ICD-10-CM | POA: Diagnosis not present

## 2021-12-11 DIAGNOSIS — I700 Atherosclerosis of aorta: Secondary | ICD-10-CM | POA: Insufficient documentation

## 2021-12-11 MED ORDER — IOHEXOL 300 MG/ML  SOLN
100.0000 mL | Freq: Once | INTRAMUSCULAR | Status: AC | PRN
  Administered 2021-12-11: 100 mL via INTRAVENOUS

## 2021-12-11 MED ORDER — SODIUM CHLORIDE (PF) 0.9 % IJ SOLN
INTRAMUSCULAR | Status: AC
  Filled 2021-12-11: qty 50

## 2021-12-12 ENCOUNTER — Telehealth: Payer: Self-pay

## 2021-12-12 ENCOUNTER — Other Ambulatory Visit: Payer: Self-pay | Admitting: Physician Assistant

## 2021-12-12 ENCOUNTER — Ambulatory Visit (HOSPITAL_COMMUNITY)
Admission: RE | Admit: 2021-12-12 | Discharge: 2021-12-12 | Disposition: A | Discharge status: Home / Self Care | Payer: BC Managed Care – PPO | Source: Ambulatory Visit | Attending: Physician Assistant | Admitting: Physician Assistant

## 2021-12-12 DIAGNOSIS — Y842 Radiological procedure and radiotherapy as the cause of abnormal reaction of the patient, or of later complication, without mention of misadventure at the time of the procedure: Secondary | ICD-10-CM | POA: Diagnosis not present

## 2021-12-12 DIAGNOSIS — Z79899 Other long term (current) drug therapy: Secondary | ICD-10-CM | POA: Diagnosis not present

## 2021-12-12 DIAGNOSIS — I700 Atherosclerosis of aorta: Secondary | ICD-10-CM | POA: Diagnosis not present

## 2021-12-12 DIAGNOSIS — C7931 Secondary malignant neoplasm of brain: Secondary | ICD-10-CM

## 2021-12-12 DIAGNOSIS — C3490 Malignant neoplasm of unspecified part of unspecified bronchus or lung: Secondary | ICD-10-CM | POA: Diagnosis not present

## 2021-12-12 DIAGNOSIS — L539 Erythematous condition, unspecified: Secondary | ICD-10-CM | POA: Diagnosis not present

## 2021-12-12 DIAGNOSIS — Z923 Personal history of irradiation: Secondary | ICD-10-CM | POA: Diagnosis not present

## 2021-12-12 DIAGNOSIS — J701 Chronic and other pulmonary manifestations due to radiation: Secondary | ICD-10-CM | POA: Diagnosis not present

## 2021-12-12 DIAGNOSIS — C7951 Secondary malignant neoplasm of bone: Secondary | ICD-10-CM

## 2021-12-12 DIAGNOSIS — R918 Other nonspecific abnormal finding of lung field: Secondary | ICD-10-CM | POA: Diagnosis not present

## 2021-12-12 DIAGNOSIS — R600 Localized edema: Secondary | ICD-10-CM | POA: Diagnosis not present

## 2021-12-12 DIAGNOSIS — R911 Solitary pulmonary nodule: Secondary | ICD-10-CM | POA: Diagnosis not present

## 2021-12-12 HISTORY — PX: IR PATIENT EVAL TECH 0-60 MINS: IMG5564

## 2021-12-12 MED ORDER — SODIUM CHLORIDE 0.9 % IV SOLN: INTRAVENOUS | Status: DC

## 2021-12-12 NOTE — Telephone Encounter (Signed)
T/C from [place] in IR stating pt was evaluated today for her open wound at the port site. It was cleaned and dressed but based on looks MD did not feel a new port would be  appropriate at this time.  She is to return in a week and it will be re evaluated at that time.  She has a PICC line that is being used for treatments. ? ?Pt wanted [name] to be aware that she is having edema in both lower extremities and this is a new issue ?

## 2021-12-12 NOTE — Discharge Instructions (Addendum)
?  Urgent needs - Interventional Radiology on call MD [phone] ? ?Wound -   Do not submerge in tub or water , Do not get chest site or PICC line site wet. ? ?Follow up with appointment to have chest site assessed with Interventional Radiology next week. Call to confirm/ make appointment  [phone] or [phone] ? ?Continue with dressing changes as previously done by IR and home health.   ? ?Follow up with your provider regarding swelling in legs. ?  ?

## 2021-12-12 NOTE — Progress Notes (Addendum)
Patient ID: Virginia [name], female   DOB: 1956-01-31, 66 y.o.   MRN: [id number] ?Patient presented to IR department today for Port-A-Cath placement.  She has a history of metastatic lung cancer with recent right chest wall Port-A-Cath removal on 11/17/21 secondary to cellulitis.  Patient has been followed in IR department since that time for hydrogel application and wound checks.  On exam today patient still has sizable wound defect at port pocket.  There is no worsening erythema or drainage from area.  Site is nontender to touch.  Patient is afebrile. She has a right arm PICC line that is functioning appropriately.  She was examined by Dr. Kathlene Cote and he feels like previous port pocket needs to heal further before placing new left-sided chest port.  A new hydrogel application was placed into port pocket today and gauze dressing applied over site.  Patient will return to IR department next week and follow-up to reassess site.  Once previous port pocket site has healed appropriately and deemed okay by IR attending we will make plans for new port placement.  Pt is also complaining today of some new onset bilateral lower extremity edema associated with mild erythema.  Oncology nurse notified of above plans/findings. ?

## 2021-12-13 ENCOUNTER — Encounter: Payer: Self-pay | Admitting: Hematology and Oncology

## 2021-12-13 ENCOUNTER — Other Ambulatory Visit (HOSPITAL_COMMUNITY): Payer: Self-pay | Admitting: Radiology

## 2021-12-13 ENCOUNTER — Encounter (HOSPITAL_COMMUNITY): Payer: Self-pay | Admitting: Radiology

## 2021-12-13 ENCOUNTER — Telehealth: Payer: Self-pay | Admitting: *Deleted

## 2021-12-13 DIAGNOSIS — C799 Secondary malignant neoplasm of unspecified site: Secondary | ICD-10-CM

## 2021-12-13 NOTE — Telephone Encounter (Signed)
TCT patient's husband, [name] regarding recent scan results. No answer but was able to leave vm message for him to return this call at his convenience . [phone] ?

## 2021-12-13 NOTE — Procedures (Signed)
Patient arrived today for a Port a Information systems manager. Dr Kathlene Cote determined that her previous port site was not healed enough to process with the placement. ?Therefore Rowe Robert [name] cleaned her old Port site and reapplied Hydrogel as well as a new clean dry dressing.  ?

## 2021-12-13 NOTE — Telephone Encounter (Signed)
-----   Message from Orson Slick, MD sent at 12/12/2021  3:36 PM EDT ----- ?Please let Virginia [name] know that her CT scan shows no evidence of new or progressive disease.  Overall her findings remain stable. ? ?----- Message ----- ?From: Interface, Rad Results In ?Sent: 12/12/2021   1:41 PM EDT ?To: Orson Slick, MD ? ? ?

## 2021-12-14 NOTE — Telephone Encounter (Signed)
TCT pt's husband, [name]. He had returned my call earlier today.  Spoke with him and advised that her recent CT scans should no new or progressive disease. Overall her scans remain stable. [name] is pleased with that and will share this with [name]. He does say she is sleeping more and sometimes falls asleep  during conversations. It doesn't happen often but sometimes it does.  She has f/u appt with Dr. Mickeal Skinner in May 2023. ?[name] is aware of [name]'s appt on 12/27/21 ?

## 2021-12-18 ENCOUNTER — Other Ambulatory Visit: Payer: Self-pay | Admitting: *Deleted

## 2021-12-18 DIAGNOSIS — C7931 Secondary malignant neoplasm of brain: Secondary | ICD-10-CM

## 2021-12-18 DIAGNOSIS — I6789 Other cerebrovascular disease: Secondary | ICD-10-CM | POA: Diagnosis not present

## 2021-12-18 DIAGNOSIS — C7951 Secondary malignant neoplasm of bone: Secondary | ICD-10-CM

## 2021-12-18 NOTE — Progress Notes (Signed)
Pt to have PICC line removed in IR tomorrow, 12/19/21 ?

## 2021-12-19 ENCOUNTER — Ambulatory Visit (HOSPITAL_COMMUNITY)
Admission: RE | Admit: 2021-12-19 | Discharge: 2021-12-19 | Disposition: A | Discharge status: Home / Self Care | Payer: BC Managed Care – PPO | Source: Ambulatory Visit | Attending: Radiology | Admitting: Radiology

## 2021-12-19 ENCOUNTER — Ambulatory Visit (HOSPITAL_COMMUNITY)
Admission: RE | Admit: 2021-12-19 | Discharge: 2021-12-19 | Disposition: A | Discharge status: Home / Self Care | Payer: BC Managed Care – PPO | Source: Ambulatory Visit | Attending: Hematology and Oncology | Admitting: Hematology and Oncology

## 2021-12-19 ENCOUNTER — Encounter (HOSPITAL_COMMUNITY): Payer: Self-pay

## 2021-12-19 ENCOUNTER — Other Ambulatory Visit (HOSPITAL_COMMUNITY): Payer: Self-pay | Admitting: Radiology

## 2021-12-19 DIAGNOSIS — C799 Secondary malignant neoplasm of unspecified site: Secondary | ICD-10-CM

## 2021-12-19 DIAGNOSIS — Z452 Encounter for adjustment and management of vascular access device: Secondary | ICD-10-CM | POA: Diagnosis not present

## 2021-12-19 DIAGNOSIS — C7931 Secondary malignant neoplasm of brain: Secondary | ICD-10-CM

## 2021-12-19 HISTORY — PX: IR PATIENT EVAL TECH 0-60 MINS: IMG5564

## 2021-12-19 NOTE — Procedures (Signed)
A new hydrogel application was placed into port pocket today and gauze dressing applied over site.   ? ?Her RUE PICC was removed in its entirety without immediate complications.  ?

## 2021-12-19 NOTE — Progress Notes (Signed)
Patient ID: Virginia [name], female   DOB: 26-Oct-1955, 66 y.o.   MRN: [id number] ?Pt with history of metastatic lung cancer with recent right chest wall Port-A-Cath removal on 11/17/21 secondary to cellulitis.  Patient has been followed in IR department since that time for hydrogel application and wound checks.  On exam today patient still has wound defect at port pocket but improving.  There is no worsening erythema or drainage from area.  Site is nontender to touch.  Patient is afebrile. She has a right arm PICC line that is functioning appropriately but oncology would like for [name] to remove.  She was examined by Dr. Anselm Pancoast and he feels like previous port pocket needs to heal further before placing new left-sided chest port.  A new hydrogel application was placed into port pocket today and gauze dressing applied over site.  Her RUE PICC was removed in its entirety without immediate complications. Oncology planning to use peripheral IV's for future treatment sessions until new port placed. Patient will return to IR department next week and follow-up to reassess site.  Once previous port pocket site has healed appropriately and deemed okay by IR attending we will make plans for new port placement.  ?

## 2021-12-21 ENCOUNTER — Other Ambulatory Visit: Payer: Self-pay | Admitting: *Deleted

## 2021-12-21 ENCOUNTER — Ambulatory Visit: Payer: BC Managed Care – PPO

## 2021-12-21 ENCOUNTER — Telehealth: Payer: Self-pay | Admitting: *Deleted

## 2021-12-21 ENCOUNTER — Other Ambulatory Visit: Payer: BC Managed Care – PPO

## 2021-12-21 ENCOUNTER — Ambulatory Visit: Payer: BC Managed Care – PPO | Admitting: Hematology and Oncology

## 2021-12-21 MED ORDER — ONDANSETRON HCL 4 MG PO TABS: 4.0000 mg | ORAL_TABLET | Freq: Three times a day (TID) | ORAL | 2 refills | Status: DC | PRN

## 2021-12-21 MED ORDER — FUROSEMIDE 20 MG PO TABS: 40.0000 mg | ORAL_TABLET | Freq: Two times a day (BID) | ORAL | 0 refills | Status: DC

## 2021-12-21 NOTE — Telephone Encounter (Signed)
Received call from pt's husband requesting refills of pt's  Xtampza 13.5 mg. Last filled on 11/16/21 for #60. Also needs oxycodone 5mg  . Last filled on 10/21/21 for #90 ?Pt uses [facility] on [address]. ? ?Refills sent in for Ondansetron and Furosemide as husband's request ?

## 2021-12-22 ENCOUNTER — Other Ambulatory Visit: Payer: Self-pay | Admitting: Hematology and Oncology

## 2021-12-22 MED ORDER — XTAMPZA ER 13.5 MG PO C12A: 1.0000 | EXTENDED_RELEASE_CAPSULE | Freq: Two times a day (BID) | ORAL | 0 refills | Status: DC

## 2021-12-22 MED ORDER — OXYCODONE HCL 5 MG PO TABS: ORAL_TABLET | ORAL | 0 refills | Status: DC

## 2021-12-27 ENCOUNTER — Ambulatory Visit (HOSPITAL_COMMUNITY)
Admission: RE | Admit: 2021-12-27 | Discharge: 2021-12-27 | Disposition: A | Discharge status: Home / Self Care | Payer: BC Managed Care – PPO | Source: Ambulatory Visit | Attending: Radiology | Admitting: Radiology

## 2021-12-27 ENCOUNTER — Other Ambulatory Visit: Payer: Self-pay | Admitting: Radiation Therapy

## 2021-12-27 ENCOUNTER — Other Ambulatory Visit: Payer: Self-pay

## 2021-12-27 ENCOUNTER — Inpatient Hospital Stay: Payer: BC Managed Care – PPO | Attending: Hematology and Oncology | Admitting: Hematology and Oncology

## 2021-12-27 ENCOUNTER — Inpatient Hospital Stay: Payer: BC Managed Care – PPO

## 2021-12-27 ENCOUNTER — Encounter (HOSPITAL_COMMUNITY): Payer: Self-pay | Admitting: Radiology

## 2021-12-27 VITALS — BP 133/99 | HR 107 | Temp 97.6°F | Resp 16 | Wt 164.5 lb

## 2021-12-27 VITALS — HR 94

## 2021-12-27 DIAGNOSIS — E032 Hypothyroidism due to medicaments and other exogenous substances: Secondary | ICD-10-CM

## 2021-12-27 DIAGNOSIS — C3490 Malignant neoplasm of unspecified part of unspecified bronchus or lung: Secondary | ICD-10-CM

## 2021-12-27 DIAGNOSIS — R42 Dizziness and giddiness: Secondary | ICD-10-CM | POA: Insufficient documentation

## 2021-12-27 DIAGNOSIS — Z5111 Encounter for antineoplastic chemotherapy: Secondary | ICD-10-CM | POA: Diagnosis not present

## 2021-12-27 DIAGNOSIS — Z95828 Presence of other vascular implants and grafts: Secondary | ICD-10-CM

## 2021-12-27 DIAGNOSIS — Z5112 Encounter for antineoplastic immunotherapy: Secondary | ICD-10-CM | POA: Diagnosis not present

## 2021-12-27 DIAGNOSIS — C3491 Malignant neoplasm of unspecified part of right bronchus or lung: Secondary | ICD-10-CM | POA: Diagnosis not present

## 2021-12-27 DIAGNOSIS — C7951 Secondary malignant neoplasm of bone: Secondary | ICD-10-CM

## 2021-12-27 DIAGNOSIS — Z79899 Other long term (current) drug therapy: Secondary | ICD-10-CM | POA: Insufficient documentation

## 2021-12-27 DIAGNOSIS — Z7901 Long term (current) use of anticoagulants: Secondary | ICD-10-CM | POA: Diagnosis not present

## 2021-12-27 DIAGNOSIS — I700 Atherosclerosis of aorta: Secondary | ICD-10-CM | POA: Diagnosis not present

## 2021-12-27 DIAGNOSIS — M549 Dorsalgia, unspecified: Secondary | ICD-10-CM | POA: Diagnosis not present

## 2021-12-27 DIAGNOSIS — Z4800 Encounter for change or removal of nonsurgical wound dressing: Secondary | ICD-10-CM | POA: Insufficient documentation

## 2021-12-27 DIAGNOSIS — C3431 Malignant neoplasm of lower lobe, right bronchus or lung: Secondary | ICD-10-CM | POA: Diagnosis not present

## 2021-12-27 DIAGNOSIS — E876 Hypokalemia: Secondary | ICD-10-CM | POA: Diagnosis not present

## 2021-12-27 DIAGNOSIS — Z86718 Personal history of other venous thrombosis and embolism: Secondary | ICD-10-CM | POA: Insufficient documentation

## 2021-12-27 DIAGNOSIS — Z9049 Acquired absence of other specified parts of digestive tract: Secondary | ICD-10-CM | POA: Insufficient documentation

## 2021-12-27 DIAGNOSIS — C799 Secondary malignant neoplasm of unspecified site: Secondary | ICD-10-CM | POA: Insufficient documentation

## 2021-12-27 DIAGNOSIS — C7931 Secondary malignant neoplasm of brain: Secondary | ICD-10-CM

## 2021-12-27 DIAGNOSIS — R918 Other nonspecific abnormal finding of lung field: Secondary | ICD-10-CM

## 2021-12-27 HISTORY — PX: IR PATIENT EVAL TECH 0-60 MINS: IMG5564

## 2021-12-27 LAB — CMP (CANCER CENTER ONLY)
ALT: 13 U/L (ref 0–44)
AST: 24 U/L (ref 15–41)
Albumin: 3.4 g/dL — ABNORMAL LOW (ref 3.5–5.0)
Alkaline Phosphatase: 60 U/L (ref 38–126)
Anion gap: 9 (ref 5–15)
BUN: 8 mg/dL (ref 8–23)
CO2: 29 mmol/L (ref 22–32)
Calcium: 9.2 mg/dL (ref 8.9–10.3)
Chloride: 103 mmol/L (ref 98–111)
Creatinine: 0.79 mg/dL (ref 0.44–1.00)
GFR, Estimated: >60 mL/min (ref >60)
Glucose, Bld: 120 mg/dL — ABNORMAL HIGH (ref 70–99)
Potassium: 3.4 mmol/L — ABNORMAL LOW (ref 3.5–5.1)
Sodium: 141 mmol/L (ref 135–145)
Total Bilirubin: 0.4 mg/dL (ref 0.3–1.2)
Total Protein: 6.9 g/dL (ref 6.5–8.1)

## 2021-12-27 LAB — CBC WITH DIFFERENTIAL (CANCER CENTER ONLY)
Abs Immature Granulocytes: 0.02 10*3/uL (ref 0.00–0.07)
Basophils Absolute: 0.1 10*3/uL (ref 0.0–0.1)
Basophils Relative: 1 %
Eosinophils Absolute: 0.2 10*3/uL (ref 0.0–0.5)
Eosinophils Relative: 3 %
HCT: 36.9 % (ref 36.0–46.0)
Hemoglobin: 12.3 g/dL (ref 12.0–15.0)
Immature Granulocytes: 0 %
Lymphocytes Relative: 29 %
Lymphs Abs: 1.6 10*3/uL (ref 0.7–4.0)
MCH: 33.9 pg (ref 26.0–34.0)
MCHC: 33.3 g/dL (ref 30.0–36.0)
MCV: 101.7 fL — ABNORMAL HIGH (ref 80.0–100.0)
Monocytes Absolute: 1 10*3/uL (ref 0.1–1.0)
Monocytes Relative: 17 %
Neutro Abs: 2.8 10*3/uL (ref 1.7–7.7)
Neutrophils Relative %: 50 %
Platelet Count: 486 10*3/uL — ABNORMAL HIGH (ref 150–400)
RBC: 3.63 MIL/uL — ABNORMAL LOW (ref 3.87–5.11)
RDW: 14.3 % (ref 11.5–15.5)
WBC Count: 5.6 10*3/uL (ref 4.0–10.5)
nRBC: 0 % (ref 0.0–0.2)

## 2021-12-27 LAB — LACTATE DEHYDROGENASE: LDH: 189 U/L (ref 98–192)

## 2021-12-27 LAB — TSH: TSH: 1.584 u[IU]/mL (ref 0.350–4.500)

## 2021-12-27 MED ORDER — PROCHLORPERAZINE MALEATE 10 MG PO TABS
10.0000 mg | ORAL_TABLET | Freq: Once | ORAL | Status: AC
  Administered 2021-12-27: 10 mg via ORAL

## 2021-12-27 MED ORDER — SODIUM CHLORIDE 0.9 % IV SOLN
200.0000 mg | Freq: Once | INTRAVENOUS | Status: AC
  Administered 2021-12-27: 200 mg via INTRAVENOUS
  Filled 2021-12-27: qty 200

## 2021-12-27 MED ORDER — SODIUM CHLORIDE 0.9 % IV SOLN
500.0000 mg/m2 | Freq: Once | INTRAVENOUS | Status: AC
  Administered 2021-12-27: 900 mg via INTRAVENOUS
  Filled 2021-12-27: qty 20

## 2021-12-27 MED ORDER — SODIUM CHLORIDE 0.9 % IV SOLN: Freq: Once | INTRAVENOUS | Status: AC

## 2021-12-27 NOTE — Progress Notes (Signed)
?Virginia [name] ?[phone]   [phone] ? ?PROGRESS NOTE ? ?Patient Care Team: ?Elby Showers, MD as PCP - General (Internal Medicine) ?Orson Slick, MD as PCP - Hematology/Oncology (Hematology and Oncology) ?Valrie Hart, RN as Oncology Nurse Navigator ?Acquanetta Chain, DO as Consulting Physician [facility] and Palliative Medicine) ? 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 CT of the head showed showed concerning for metastatic disease involving the left cerebellum and right occipital lobe.  ?2) 09/30/2019: MRI brain confirms mass within the left cerebellum measures 1.6 x 1.3 x 1.5 cm as well as at least 8 metastatic lesions within the supratentorial and infratentorial brain as outlined ?3) 10/01/2019: PET CT scan revealed hypermetabolic infrahilar right lower lobe mass with ?hypermetabolic nodal metastases in the right hilum, mediastinum, right supraclavicular region, left axilla and lower left neck. ?4) 10/08/2019: US guided biopsy of the lymph nodes confirms poorly differentiated non-small cell carcinoma. Immunohistochemistry confirms adenocarcinoma of lung primary. PD-L1 and NGS later revealed a MET Exon 14 mutation and TPS of 90%.  ?5) 10/12/2019: Establish care with Dr. Lorenso Courier  ?6) 11/04/2019: Day 1 of capmatinib 443m BID ?7) 11/09/2019: presented to Rad/Onc with a DVT in LLE. Seen in symptom management clinic and started on apixaban.  ?8) 12/29/2019: CT C/A/P showed interval decrease in size of mass involving the superior segment of right lower lobe and reduction in mediastinal and left supraclavicular adenopathy though some small spinal lesions were noted in the interim between the two sets of scans ?9) 01/25/2020: temporarily stopped capmatinib due to symptoms of nausea/fatigue. Restarted therapy on 01/30/2020 after brief chemo holiday.  ?10)  03/22/2020: CT C/A/P reveals a mixed picture, with new lung nodule and increased lymphadenopathy, however response in bone lesions. D/c capmatinib therapy ?11) 04/08/2020: start of Carbo/Pem/Pem for 2nd line treatment. Cycle 1 Day 1   ?12) 04/29/2020: Cycle 2 Day 1 Carbo/Pem/Pem  ?13) 05/20/2020: Cycle 3 Day 1 Carbo/Pem/Pem  ?14) 06/09/2020: Cycle 4 Day 1 Carbo/Pem/Pem  ?15) 06/29/2020: Cycle 5 Day 1 maintenance Pem/Pem  ?16) 07/20/2020: Cycle 6 Day 1 maintenance Pem/Pem  ?17) 08/12/2020: Cycle 7 Day 1 maintenance Pem/Pem  ?18) 09/01/2020: Cycle 8 Day 1 maintenance Pem/Pem  ?19) 09/21/2020: Cycle 9 Day 1 maintenance Pem/Pem  ?20) 10/13/2020: Cycle 10 Day 1 maintenance Pem/Pem  ?21) 11/03/2020: Cycle 11 Day 1 maintenance Pem/Pem  ?22) 11/25/2020:  Cycle 12 Day 1 maintenance Pem/Pem ?23) 12/15/2020:  Cycle 13 Day 1 maintenance Pem/Pem ?24) 01/05/2021:  Cycle 14 Day 1 maintenance Pem/Pem ?25) 01/26/2021:  Cycle 15 Day 1 maintenance Pem/Pem plus Bevacizumab.  ?02/17/2021: Cycle 16 Day 1 maintenance Pem/Pem plus Bevacizumab.  ?03/10/2021: Cycle 17 Day 1 maintenance Pem/Pem plus Bevacizumab.  ?03/31/2021: Cycle 18 Day 1 maintenance Pem/Pem  ?04/20/2021:  Cycle 19 Day 1 maintenance Pem/Pem  ?05/12/2021: Cycle 20 Day 1 maintenance Pem/Pem ?06/01/2021: Cycle 21 Day 1 maintenance Pem/Pem ?06/22/2021: Cycle 22 Day 1 maintenance Pem/Pem ?07/13/2021: Cycle 23 Day 1 maintenance Pem/Pem ?08/16/2021: Cycle 24 Day 1 maintenance Pem/Pem ?09/08/2021: Cycle 25 Day 1 maintenance Pem/Pem plus Bevacizumab ?09/28/2021: Cycle 26 Day 1 maintenance Pem/Pem plus Bevacizumab ?10/20/2021: Cycle 27 Day 1 maintenance Pem/Pem plus Bevacizumab ?11/09/2021: Cycle 28 Day 1 maintenance Pem/Pem plus Bevacizumab ?12/06/2021: Cycle 29 Day 1 maintenance Pem/Pem plus Bevacizumab ? ?Interval History:  ?Virginia POUNDS89y.o. female with medical history significant for metastatic adenocarcinoma of  the lung presents for a follow up visit. The patient's last visit was on 11/29/2021.  In  the interim since the last visit, she had her port removed and will be receiving chemotherapy peripherally.  ? ?On exam today Virginia [name] is accompanied by her daughter.  She reports overall she has been good in the interim since her last visit.  She has been having good appetite which is returning strong.  She notes she has been having some issues with dizziness and sleepiness.  Her back pain has been about the same level and is well controlled on her current oxycodone and Xtampza regimen.  She has also been taking some Tylenol to help with this.  She reports her bowel movements have been "okay".  She does that she has been doing her best to exercise and continue to walk.  She is feeling cold today and covered in a blanket.  Her port site is currently healing up well and will be seeing IR later today.  She denies any fevers, chills, night sweats, chest pain, cough, rash or neuropathy. She has no other complaints. Rest of the 10 point ROS is below.  She is willing and able to proceed with treatment at this time. ? ?MEDICAL HISTORY:  ?Past Medical History:  ?Diagnosis Date  ? Anemia   ? Anxiety   ? Concussion 09/28/2019  ? DVT (deep venous thrombosis) ([place]) 2021  ? L leg  ? Dyspnea   ? GERD (gastroesophageal reflux disease)   ? Hypercholesterolemia   ? per pt, she does not have elevated lipids  ? Hypertension   ? 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 PONV (postoperative nausea and vomiting)   ? Tobacco abuse   ? ? ?SURGICAL HISTORY: ?Past Surgical History:  ?Procedure Laterality Date  ? ABDOMINAL HYSTERECTOMY    ? partial/ left ovaries  ? APPLICATION OF CRANIAL NAVIGATION N/A 12/02/2020  ? Procedure: APPLICATION OF CRANIAL NAVIGATION;  Surgeon: Erline Levine, MD;  Location: [place];  Service: Neurosurgery;  Laterality: N/A;  ? CHOLECYSTECTOMY    ? CRANIOTOMY N/A 12/02/2020  ? Procedure: Posterior fossa craniotomy for tumor resection with brainlab;  Surgeon: Erline Levine, MD;  Location: [place];   Service: Neurosurgery;  Laterality: N/A;  ? DILATION AND CURETTAGE OF UTERUS    ? IR IMAGING GUIDED PORT INSERTION  10/23/2019  ? IR PATIENT EVAL TECH 0-60 MINS  11/21/2021  ? IR PATIENT EVAL TECH 0-60 MINS  11/24/2021  ? IR PATIENT EVAL TECH 0-60 MINS  11/29/2021  ? IR PATIENT EVAL TECH 0-60 MINS  12/04/2021  ? IR PATIENT EVAL TECH 0-60 MINS  12/12/2021  ? IR PATIENT EVAL TECH 0-60 MINS  12/19/2021  ? IR PATIENT EVAL TECH 0-60 MINS  12/27/2021  ? IR REMOVAL TUN ACCESS W/ PORT W/O FL MOD SED  11/17/2021  ? KYPHOPLASTY N/A 03/15/2020  ? Procedure: Thoracic Eight KYPHOPLASTY;  Surgeon: Erline Levine, MD;  Location: [place];  Service: Neurosurgery;  Laterality: N/A;  prone ?  ? LIPOMA EXCISION  2018  ? removed under left breast and right thigh.  ? TUBAL LIGATION    ? ? ?ALLERGIES:  has No Known Allergies. ? ?MEDICATIONS:  ?Current Outpatient Medications  ?Medication Sig Dispense Refill  ? acetaminophen (TYLENOL) 500 MG tablet Take 1,000 mg by mouth every 8 (eight) hours as needed for moderate pain.    ? albuterol (VENTOLIN HFA) 108 (90 Base) MCG/ACT inhaler INHALE 2 PUFFS  INTO THE LUNGS EVERY 6 HOURS AS NEEDED FOR WHEEZING OR SHORTNESS OF BREATH 6.7 g 11  ? ALPRAZolam (XANAX) 0.25 MG tablet Take 1 tablet (0.25 mg total) by mouth 2 (two) times daily as needed for anxiety 60 tablet 1  ? Cyanocobalamin (VITAMIN B12 PO) Take 1 tablet by mouth daily. Gummies    ? EPINEPHrine 0.3 mg/0.3 mL IJ SOAJ injection Inject 0.3 mg into the muscle as needed for anaphylaxis. (Patient not taking: Reported on 05/23/6858) 2 each 0  ? folic acid (FOLVITE) 1 MG tablet Take 1 tablet by mouth daily. 90 tablet 1  ? furosemide (LASIX) 20 MG tablet Take 2 tablets (40 mg total) by mouth 2 times daily. 360 tablet 0  ? lidocaine-prilocaine (EMLA) cream Apply to port as needed. 30 g 0  ? meclizine (ANTIVERT) 25 MG tablet Take 1 tablet (25 mg total) by mouth 3 (three) times daily as needed for dizziness. 30 tablet 0  ? Multiple Vitamins-Minerals (AIRBORNE PO) Take 1  tablet by mouth daily.     ? ofloxacin (OCUFLOX) 0.3 % ophthalmic solution 2 drops each eye 4 times a day for 5 days (Patient taking differently: Place 2 drops into both eyes daily as needed (irritation).)

## 2021-12-27 NOTE — Procedures (Signed)
A new hydrogel application was placed into the port cavity and a telfa/tegaderm dressing was applied over the site. ?

## 2021-12-27 NOTE — Progress Notes (Signed)
Patient ID: Virginia [name], female   DOB: [birth year], 66 y.o.   MRN: [id number] ?Pt with history of metastatic lung cancer with recent right chest wall Port-A-Cath removal on 11/17/21 secondary to cellulitis. Blood cx were negative.  Patient has been followed in IR department since that time for hydrogel application and wound checks. On exam today patient still has wound defect at port pocket but improving.  There is no worsening erythema or drainage from area.  Site is nontender to touch.  Patient is afebrile. Her RUE PICC was removed on 4/11 per request from oncology. She underwent chemo infusion via peripheral IV today without difficulty. Pt was examined as well by Dr. Earleen Newport. He recommends continuation of current plan with weekly wound checks and hydrogel applications. Once wound site has healed appropriately can consider new left sided port placement. Pt also given option of new PICC placement in interim should future peripheral access become cumbersome (currently receiving infusion at 3 week intervals). She prefers to continue with peripheral access for now for treatments. If wound does not heal in timely fashion may need to consider surgical consult. Pt received new application of hydrogel to port pocket today and new dressing was placed.  ?

## 2022-01-01 ENCOUNTER — Ambulatory Visit: Payer: BC Managed Care – PPO | Attending: Internal Medicine | Admitting: Physical Therapy

## 2022-01-01 ENCOUNTER — Other Ambulatory Visit (HOSPITAL_COMMUNITY): Payer: Self-pay

## 2022-01-01 ENCOUNTER — Other Ambulatory Visit (HOSPITAL_COMMUNITY): Payer: Self-pay | Admitting: Radiology

## 2022-01-01 ENCOUNTER — Encounter: Payer: Self-pay | Admitting: Physical Therapy

## 2022-01-01 DIAGNOSIS — R262 Difficulty in walking, not elsewhere classified: Secondary | ICD-10-CM

## 2022-01-01 DIAGNOSIS — M25512 Pain in left shoulder: Secondary | ICD-10-CM | POA: Insufficient documentation

## 2022-01-01 DIAGNOSIS — R2681 Unsteadiness on feet: Secondary | ICD-10-CM | POA: Insufficient documentation

## 2022-01-01 DIAGNOSIS — R2689 Other abnormalities of gait and mobility: Secondary | ICD-10-CM | POA: Insufficient documentation

## 2022-01-01 DIAGNOSIS — Y842 Radiological procedure and radiotherapy as the cause of abnormal reaction of the patient, or of later complication, without mention of misadventure at the time of the procedure: Secondary | ICD-10-CM | POA: Insufficient documentation

## 2022-01-01 DIAGNOSIS — I6789 Other cerebrovascular disease: Secondary | ICD-10-CM | POA: Diagnosis not present

## 2022-01-01 DIAGNOSIS — R278 Other lack of coordination: Secondary | ICD-10-CM | POA: Insufficient documentation

## 2022-01-01 DIAGNOSIS — C7931 Secondary malignant neoplasm of brain: Secondary | ICD-10-CM | POA: Insufficient documentation

## 2022-01-01 DIAGNOSIS — C799 Secondary malignant neoplasm of unspecified site: Secondary | ICD-10-CM

## 2022-01-01 DIAGNOSIS — M6281 Muscle weakness (generalized): Secondary | ICD-10-CM | POA: Diagnosis not present

## 2022-01-01 NOTE — Therapy (Signed)
[facility] [address] ?[place], [place], [address] ?Phone: [phone]   Fax:  [phone] ? ?Physical Therapy Evaluation ? ?Patient Details  ?Name: LOGEN FOWLE ?MRN: [id number] ?Date of Birth: 1956-06-11 ?Referring Provider (PT): vaslow ? ? ?Encounter Date: 01/01/2022 ? ? PT End of Session - 01/01/22 1410   ? ? Visit Number 1   ? Date for PT Re-Evaluation 03/26/22   ? Authorization Type 30V total/year   ? PT Start Time 1141   ? PT Stop Time 2683   ? PT Time Calculation (min) 41 min   ? Activity Tolerance Patient tolerated treatment well   ? Behavior During Therapy [facility] for tasks assessed/performed   ? ?  ?  ? ?  ? ? ?Past Medical History:  ?Diagnosis Date  ? Anemia   ? Anxiety   ? Concussion 09/28/2019  ? DVT (deep venous thrombosis) ([place]) 2021  ? L leg  ? Dyspnea   ? GERD (gastroesophageal reflux disease)   ? Hypercholesterolemia   ? per pt, she does not have elevated lipids  ? Hypertension   ? 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 PONV (postoperative nausea and vomiting)   ? Tobacco abuse   ? ? ?Past Surgical History:  ?Procedure Laterality Date  ? ABDOMINAL HYSTERECTOMY    ? partial/ left ovaries  ? APPLICATION OF CRANIAL NAVIGATION N/A 12/02/2020  ? Procedure: APPLICATION OF CRANIAL NAVIGATION;  Surgeon: Erline Levine, MD;  Location: [place];  Service: Neurosurgery;  Laterality: N/A;  ? CHOLECYSTECTOMY    ? CRANIOTOMY N/A 12/02/2020  ? Procedure: Posterior fossa craniotomy for tumor resection with brainlab;  Surgeon: Erline Levine, MD;  Location: [place];  Service: Neurosurgery;  Laterality: N/A;  ? DILATION AND CURETTAGE OF UTERUS    ? IR IMAGING GUIDED PORT INSERTION  10/23/2019  ? IR PATIENT EVAL TECH 0-60 MINS  11/21/2021  ? IR PATIENT EVAL TECH 0-60 MINS  11/24/2021  ? IR PATIENT EVAL TECH 0-60 MINS  11/29/2021  ? IR PATIENT EVAL TECH 0-60 MINS  12/04/2021  ? IR PATIENT EVAL TECH 0-60 MINS  12/12/2021  ? IR PATIENT EVAL TECH 0-60 MINS   12/19/2021  ? IR PATIENT EVAL TECH 0-60 MINS  12/27/2021  ? IR REMOVAL TUN ACCESS W/ PORT W/O FL MOD SED  11/17/2021  ? KYPHOPLASTY N/A 03/15/2020  ? Procedure: Thoracic Eight KYPHOPLASTY;  Surgeon: Erline Levine, MD;  Location: [place];  Service: Neurosurgery;  Laterality: N/A;  prone ?  ? LIPOMA EXCISION  2018  ? removed under left breast and right thigh.  ? TUBAL LIGATION    ? ? ?There were no vitals filed for this visit. ? ? ? Subjective Assessment - 01/01/22 1142   ? ? Subjective Patient reports 2 year H/O lung cancer. They discovered brain mets and she received radiation which shrunk the tumors in her brain. She had a follow up surgery to remove a tumor on her cerebellum, which turned out to be non cancerous, but the surgery caused balance issues and issues with focus. Her feet tend to swell sometimes, and are swollen today, her L foot hurts due to the pressure. She presents today for blaance and mobility re-education. She also reports some L arm and leg shaking. She rports L shoulder pain, which started about 3 months. She feels it may be due to using it to stabilize herself, especially on the steps. She also  reports back pain due to spinal mets. She reports back surgery, possibly a kyphoplasty. She gets dizzy when she turns her head too fast or if she bends over. She fatigues easily due to her chemo, she gets it every three weeks.   ? 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 How long can you sit comfortably? N/A   ? How long can you stand comfortably? N/A-shaky   ? How long can you walk comfortably? As long as she has something to hold onto.   ? Patient Stated Goals Be able to walk, control the dizziness.   ? Currently in Pain? Yes   ? Pain Score 6    ? Pain Location Back   ? Pain Orientation Left;Mid   ? Pain Descriptors / Indicators Aching   ? Pain Type Chronic pain   ? Pain Onset More than a month ago   ? Pain Frequency Constant   ? Aggravating Factors  lying too much   ? Pain Relieving Factors  heating pad   ? ?  ?  ? ?  ? ? ? ? ? [facility] PT Assessment - 01/01/22 0001   ? ?  ? Assessment  ? Medical Diagnosis lung Ca with mets   ? Referring Provider (PT) vaslow   ?  ? Precautions  ? Precaution Comments She reports bending over or turning quickly causes dizziness.   ?  ? Balance Screen  ? Has the patient fallen in the past 6 months No   ?  ? Home Environment  ? Living Environment Private residence   ? Living Arrangements Spouse/significant other   ? Available Help at Discharge Family   ? Type of Home House   ? Home Access Stairs to enter   ? Entrance Stairs-Number of Steps 4   ? Entrance Stairs-Rails None   ? Home Layout Two level   ? Alternate Level Stairs-Number of Steps 12   ? Alternate Level Stairs-Rails Can reach both   ? [place] - 4 wheels;Wheelchair - manual   ? Additional Comments Has tub shower combination, elevated commode.   ?  ? Prior Function  ? Level of Independence Independent   She can do it herself, but her husband often helps for speed.  ? Vocation Part time employment   ? 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   ? Leisure walking, going out into the yard.   ?  ? Cognition  ? Overall Cognitive Status Within Functional Limits for tasks assessed   ?  ? Sensation  ? Light Touch Appears Intact   ?  ? Coordination  ? Gross Motor Movements are Fluid and Coordinated Yes   ? Fine Motor Movements are Fluid and Coordinated --   Tremors noted in L U and LE  ?  ? Posture/Postural Control  ? Posture Comments round shoulders, flexed posture, forward head   ?  ? ROM / Strength  ? AROM / PROM / Strength AROM;Strength   ?  ? AROM  ? Overall AROM Comments L shoulder elevation limited by pain > 90degrees. L shoulder pain with IR.  BLE with mild tightness in all hip movements.   ?  ? Strength  ? Strength Assessment Site Shoulder;Hip;Knee;Ankle   ? Right/Left Shoulder Right;Left   ? Right Shoulder Flexion 4-/5   ? Right Shoulder ABduction 4-/5   ? Left Shoulder Flexion 3/5   ? Left  Shoulder ABduction 3/5   ? Right/Left Hip  Right;Left   ? Right Hip Flexion 3+/5   ? Right Hip Extension 3+/5   ? Right Hip ABduction 4-/5   ? Left Hip Flexion 3+/5   ? Left Hip Extension 3/5   ? Left Hip ABduction --   unable to assess.  ? Right/Left Knee Right;Left   ? Right Knee Flexion 4-/5   ? Right Knee Extension 4-/5   ? Left Knee Flexion 3+/5   ? Right/Left Ankle Right;Left   ? Right Ankle Dorsiflexion 3+/5   ? Left Ankle Dorsiflexion 3+/5   ?  ? Flexibility  ? Soft Tissue Assessment /Muscle Length yes   ? Hamstrings B SLR < 50 degrees.   ?  ? Palpation  ? Palpation comment Patient was TTP mildly over L supraspinatus tendon an dmoderatley over L biceps tendon   ?  ? Special Tests  ?  Special Tests Rotator Cuff Impingement   ? Rotator Cuff Impingment tests Michel Bickers test;Empty Can test   ?  ? Hawkins-Kennedy test  ? Findings Positive   ? Side Left   ? Comments --   Mildly positive, when add added.  ?  ? Empty Can test  ? Findings Positive   ? Side Left   ?  ? Bed Mobility  ? Bed Mobility Sit to Supine;Supine to Sit   ? Supine to Sit Minimal Assistance - Patient > 75%   Patient reported initial dizziness.  ? Sit to Supine Minimal Assistance - Patient > 75%   dizzy  ?  ? Transfers  ? Transfers Sit to Stand;Stand to Sit   ? Sit to Stand 5: Supervision   ? Stand to Sit 5: Supervision   ?  ? Ambulation/Gait  ? Ambulation/Gait Yes   ? Ambulation/Gait Assistance 6: Modified independent (Device/Increase time)   ? Ambulation Distance (Feet) 100 Feet   ? Assistive device 4-wheeled walker   ? Gait Pattern Step-through pattern;Decreased step length - right;Decreased step length - left;Decreased stance time - right;Decreased stance time - left;Decreased stride length;Shuffle;Ataxic   ? Ambulation Surface Level;Indoor   ?  ? Balance  ? Balance Assessed Yes   ?  ? Standardized Balance Assessment  ? Standardized Balance Assessment Timed Up and Go Test;Five Times Sit to Stand   ? Five times sit to stand comments  25.37    ?  ? Timed Up and Go Test  ? Normal TUG (seconds) 16.87   With 2MEB  ? ?  ?  ? ?  ? ? ? ? ? ? ? ? ? ? ? ? ? ?Objective measurements completed on examination: See above findings.  ? ? ? ? ? ? ? ? ? ? ? ? ? ?

## 2022-01-03 ENCOUNTER — Ambulatory Visit (HOSPITAL_COMMUNITY)
Admission: RE | Admit: 2022-01-03 | Discharge: 2022-01-03 | Disposition: A | Discharge status: Home / Self Care | Payer: BC Managed Care – PPO | Source: Ambulatory Visit | Attending: Radiology | Admitting: Radiology

## 2022-01-03 ENCOUNTER — Other Ambulatory Visit (HOSPITAL_COMMUNITY): Payer: Self-pay | Admitting: Radiology

## 2022-01-03 ENCOUNTER — Encounter (HOSPITAL_COMMUNITY): Payer: Self-pay | Admitting: Radiology

## 2022-01-03 DIAGNOSIS — C799 Secondary malignant neoplasm of unspecified site: Secondary | ICD-10-CM

## 2022-01-03 DIAGNOSIS — Z4800 Encounter for change or removal of nonsurgical wound dressing: Secondary | ICD-10-CM | POA: Diagnosis not present

## 2022-01-03 HISTORY — PX: IR PATIENT EVAL TECH 0-60 MINS: IMG5564

## 2022-01-03 NOTE — Procedures (Signed)
Ascencion Dike [name] saw patient in IR holding today for a Port a Cath site check.  Her dressing was removed and the site was cleaned.  Additional Hydrogel was install in the wound and the site was covered with a guaze- Tegaderm dressing. ?  ?She has an appointment in a week for another recheck.  ?  ? ?Revision History ? ?

## 2022-01-03 NOTE — Progress Notes (Signed)
Patient ID: Virginia [name], female   DOB: [birth year], 66 y.o.   MRN: [id number] ?Pt with history of metastatic lung cancer with recent right chest wall Port-A-Cath removal on 11/17/21 secondary to cellulitis. Blood cx were negative.  Patient has been followed in IR department since that time for hydrogel application and wound checks. On exam today patient still has wound defect at port pocket, slowly improving.  There is no worsening erythema or drainage from area.  Site is nontender to touch. The pocket should be granulating better at this point, 6 weeks post removal. She is actively receiving chemotherapy and and that may be delaying her healing. ? ?I suggested referral to [facility] for further evaluation as an option. ? ?She can also continue to come here for wound checks though I think she would probably be better off with daily or at least qod dressing changes. ?The pt and her husband seem a little hesitant to the idea of do daily dressing changes themselves. ? ?We filled the bed with hydrogel and covered with sterile gauze.  ?The pt has a return appt with [name] in 1 week.  ? ?She would like referral to the wound care center since she's made slow progress. ? ?Ascencion Dike [name] ?Interventional Radiology ?01/03/2022 ?2:35 PM ? ?

## 2022-01-03 NOTE — Progress Notes (Addendum)
Ascencion Dike [name] saw patient in IR holding today for a Port a Cath site check.  Her dressing was removed and the site was cleaned.  Additional Hydrogel was install in the wound and the site was covered with a guaze- Tegaderm dressing. ? ?She has an appointment in a week for another recheck. ?

## 2022-01-04 ENCOUNTER — Ambulatory Visit: Payer: BC Managed Care – PPO | Admitting: Physical Therapy

## 2022-01-04 ENCOUNTER — Encounter: Payer: Self-pay | Admitting: Physical Therapy

## 2022-01-04 DIAGNOSIS — M6281 Muscle weakness (generalized): Secondary | ICD-10-CM | POA: Diagnosis not present

## 2022-01-04 DIAGNOSIS — R278 Other lack of coordination: Secondary | ICD-10-CM

## 2022-01-04 DIAGNOSIS — R2681 Unsteadiness on feet: Secondary | ICD-10-CM | POA: Diagnosis not present

## 2022-01-04 DIAGNOSIS — R2689 Other abnormalities of gait and mobility: Secondary | ICD-10-CM | POA: Diagnosis not present

## 2022-01-04 DIAGNOSIS — R262 Difficulty in walking, not elsewhere classified: Secondary | ICD-10-CM | POA: Diagnosis not present

## 2022-01-04 DIAGNOSIS — Y842 Radiological procedure and radiotherapy as the cause of abnormal reaction of the patient, or of later complication, without mention of misadventure at the time of the procedure: Secondary | ICD-10-CM | POA: Diagnosis not present

## 2022-01-04 DIAGNOSIS — I6789 Other cerebrovascular disease: Secondary | ICD-10-CM | POA: Diagnosis not present

## 2022-01-04 DIAGNOSIS — C7931 Secondary malignant neoplasm of brain: Secondary | ICD-10-CM | POA: Diagnosis not present

## 2022-01-04 DIAGNOSIS — M25512 Pain in left shoulder: Secondary | ICD-10-CM | POA: Diagnosis not present

## 2022-01-04 NOTE — Therapy (Signed)
[facility] ?[place], [place], [address] ?Phone: [phone]   Fax:  [phone] ? ?Physical Therapy Treatment ? ?Patient Details  ?Name: Virginia [name] ?MRN: [id number] ?Date of Birth: 1956-04-18 ?Referring Provider (PT): vaslow ? ? ?Encounter Date: 01/04/2022 ? ? PT End of Session - 01/04/22 1145   ? ? Visit Number 2   ? PT Start Time 1104   ? PT Stop Time 6962   ? PT Time Calculation (min) 38 min   ? Activity Tolerance Patient tolerated treatment well   ? Behavior During Therapy [facility] for tasks assessed/performed   ? ?  ?  ? ?  ? ? ?Past Medical History:  ?Diagnosis Date  ? Anemia   ? Anxiety   ? Concussion 09/28/2019  ? DVT (deep venous thrombosis) ([place]) 2021  ? L leg  ? Dyspnea   ? GERD (gastroesophageal reflux disease)   ? Hypercholesterolemia   ? per pt, she does not have elevated lipids  ? Hypertension   ? 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 PONV (postoperative nausea and vomiting)   ? Tobacco abuse   ? ? ?Past Surgical History:  ?Procedure Laterality Date  ? ABDOMINAL HYSTERECTOMY    ? partial/ left ovaries  ? APPLICATION OF CRANIAL NAVIGATION N/A 12/02/2020  ? Procedure: APPLICATION OF CRANIAL NAVIGATION;  Surgeon: Erline Levine, MD;  Location: [facility];  Service: Neurosurgery;  Laterality: N/A;  ? CHOLECYSTECTOMY    ? CRANIOTOMY N/A 12/02/2020  ? Procedure: Posterior fossa craniotomy for tumor resection with brainlab;  Surgeon: Erline Levine, MD;  Location: [facility];  Service: Neurosurgery;  Laterality: N/A;  ? DILATION AND CURETTAGE OF UTERUS    ? IR IMAGING GUIDED PORT INSERTION  10/23/2019  ? IR PATIENT EVAL TECH 0-60 MINS  11/21/2021  ? IR PATIENT EVAL TECH 0-60 MINS  11/24/2021  ? IR PATIENT EVAL TECH 0-60 MINS  11/29/2021  ? IR PATIENT EVAL TECH 0-60 MINS  12/04/2021  ? IR PATIENT EVAL TECH 0-60 MINS  12/12/2021  ? IR PATIENT EVAL TECH 0-60 MINS  12/19/2021  ? IR PATIENT EVAL TECH 0-60 MINS  12/27/2021  ? IR PATIENT EVAL TECH  0-60 MINS  01/03/2022  ? IR REMOVAL TUN ACCESS W/ PORT W/O FL MOD SED  11/17/2021  ? KYPHOPLASTY N/A 03/15/2020  ? Procedure: Thoracic Eight KYPHOPLASTY;  Surgeon: Erline Levine, MD;  Location: [facility];  Service: Neurosurgery;  Laterality: N/A;  prone ?  ? LIPOMA EXCISION  2018  ? removed under left breast and right thigh.  ? TUBAL LIGATION    ? ? ?There were no vitals filed for this visit. ? ? Subjective Assessment - 01/04/22 1114   ? ? Subjective Patient reports swelling in her lower legs and feet with foot pain, unable to wear her shoes.   ? Currently in Pain? Yes   ? Pain Score 5    when walking, 10 when pressing on her feet.  ? Pain Location Foot   ? Pain Orientation Left;Right   ? Pain Descriptors / Indicators Pressure   ? Pain Type Acute pain   ? Pain Onset In the past 7 days   ? Pain Frequency Intermittent   ? ?  ?  ? ?  ? ? ? ? ? ? ? ? ? ? ? ? ? ? ? ? ? ? ? ? [facility] Adult PT Treatment/Exercise - 01/04/22 0001   ? ?  ?  Exercises  ? Exercises Knee/Hip   ?  ? Knee/Hip Exercises: Seated  ? Other Seated Knee/Hip Exercises Postural control. Hold 2# ball in BUE. With upright posture, slowl raise and lower ball x 10, then move it side to side at chest height, keeping trunk stable.   ?  ? Knee/Hip Exercises: Supine  ? Heel Slides Strengthening;Both;1 set;10 reps   rolling ball, KTC, then rolling ball KTC in diagonal.  ? Bridges Strengthening;Both;1 set;10 reps   ? Bridges Limitations over physioball   ? Bridges with [employer] Strengthening;Both;1 set;10 reps   over physioball with B legs turned into IR.  ? Straight Leg Raises Strengthening;Both;1 set;10 reps   alternating SLR off physioball while stabilizing with the other ball.  ? Other Supine Knee/Hip Exercises Clamshells against red Tband resistance, x 10 reps.   ? ?  ?  ? ?  ? ? ? ? ? ? ? ? ? ? PT Education - 01/04/22 1135   ? ? Education Details HEP   ? Person(s) Educated Patient   ? Methods Explanation;Demonstration;Handout   ? Comprehension Returned  demonstration;Verbalized understanding   ? ?  ?  ? ?  ? ? ? PT Short Term Goals - 01/04/22 1254   ? ?  ? PT SHORT TERM GOAL #1  ? Title I with initial HEP   ? Time 4   ? Period Weeks   ? Status On-going   ? Target Date 01/29/22   ?  ? PT SHORT TERM GOAL #2  ? Title Patient will perform 5x STS in < 20 seconds to demosntrate improved B LE strength and balance.   ? Baseline 25.37   ? Time 4   ? Period Weeks   ? Status New   ? Target Date 01/29/22   ?  ? PT SHORT TERM GOAL #3  ? Title Paitentwill climb up and down 3 stpes using B rails, S, safe, with no strain on LUE.   ? Baseline Difficulty climbing steps at home with B rails, L shoulder pain.   ? Time 4   ? Period Weeks   ? Status New   ? Target Date 01/29/22   ? ?  ?  ? ?  ? ? ? ? PT Long Term Goals - 01/01/22 1425   ? ?  ? PT LONG TERM GOAL #1  ? Title I with final HEP   ? Time 12   ? Period Weeks   ? Status New   ? Target Date 03/26/22   ?  ? PT LONG TERM GOAL #2  ? Title Paiten twill perform 5x STS in <12 seconds to demonstrate imporved BLE strength and improved balance.   ? Baseline 25.37   ? Time 12   ? Period Weeks   ? Status New   ? Target Date 03/26/22   ?  ? PT LONG TERM GOAL #3  ? Title Patient will complete normal TUG in < 13.5 seconds, with AD, to demonstrate decreased fall risk.   ? Baseline 16.87   ? Time 12   ? Period Weeks   ? Status New   ? Target Date 03/26/22   ?  ? PT LONG TERM GOAL #4  ? Title Patient will be able to walk at least 800' with LRAD, on level and unleve surefaces, with S.   ? Baseline 80', 4 WRW, CGA, level surfaces.   ? Time 12   ? Period Weeks   ? Status New   ?  Target Date 03/26/22   ?  ? PT LONG TERM GOAL #5  ? Title Patient will safely navigate up and down 12 steps using U or B rails, S, demosntrate safe technique that will not strain her L shoulder.   ? Baseline Patient reports she manages steps iwth difficulty, pain in L shoulder   ? Time 12   ? Period Weeks   ? Status New   ? Target Date 03/26/22   ? ?  ?  ? ?   ? ? ? ? ? ? ? ? Plan - 01/04/22 1146   ? ? Clinical Impression Statement Patient reports that her feet swelled up and are painful. she is unable to wear shoes. B lower legs discolored and swollen. Performed trunk and lower body strengthening in supine with legs over physioball. while elevated, the normal color returned to BLE and she reported decreed pressure in the limbs. encouraged her to elevate legs at home.   ? Personal Factors and Comorbidities Comorbidity 1   ? Comorbidities Lung Ca with mets   ? Examination-Activity Limitations Bathing;Locomotion Level;Transfers;Bed Mobility;Reach Overhead;Bend;Carry;Squat;Stand;Lift   ? Examination-Participation Restrictions Meal Prep;Occupation;Driving   ? Stability/Clinical Decision Making Evolving/Moderate complexity   ? Clinical Decision Making Moderate   ? Rehab Potential Good   ? PT Frequency 2x / week   ? PT Duration 12 weeks   ? PT Treatment/Interventions ADLs/Self Care Home Management;Iontophoresis 35m/ml Dexamethasone;Gait training;Neuromuscular re-education;Manual techniques;Functional mobility training;Stair training;Moist Heat;Electrical Stimulation;Cryotherapy;Balance training;Therapeutic exercise;Therapeutic activities;Patient/family education;Dry needling;Passive range of motion;Energy conservation   ? PT Next Visit Plan Initiate HEP, L shoulder pain treatment   ? PT Home Exercise Plan [email or web address]   ? Consulted and Agree with Plan of Care Patient   ? ?  ?  ? ?  ? ? ?Patient will benefit from skilled therapeutic intervention in order to improve the following deficits and impairments:  Abnormal gait, Decreased range of motion, Difficulty walking, Impaired UE functional use, Dizziness, Decreased activity tolerance, Pain, Decreased balance, Impaired flexibility, Improper body mechanics, Postural dysfunction, Decreased strength, Decreased mobility ? ?Visit Diagnosis: ?Difficulty in walking, not elsewhere classified ? ?Muscle weakness (generalized) ? ?Unsteadiness  on feet ? ?Other lack of coordination ? ?Other abnormalities of gait and mobility ? ?Acute pain of left shoulder ? ? ? ? ?Problem List ?Patient Active Problem List  ? Diagnosis Date Noted  ? 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 Rash

## 2022-01-04 NOTE — Patient Instructions (Signed)
Access Code: [email or web address] ?URL: [email or web address] ?Date: 01/04/2022 ?Prepared by: Ethel Rana ? ?Exercises ?- Bridge with Arms at [employer] and Feet on [employer]  - 1 x daily - 7 x weekly - 1 sets - 10 reps ?- Bridge on ball and roll it side to side.  - 1 x daily - 7 x weekly - 1 sets - 10 reps ?- Bridge with Hamstring Curl on Swiss Ball  - 1 x daily - 7 x weekly - 1 sets - 10 reps ?- Hooklying Clamshell with Resistance  - 1 x daily - 7 x weekly - 2 sets - 10 reps ?- Supine Straight Leg Raises  - 1 x daily - 7 x weekly - 1 sets - 10 reps ?- Straight Leg Raise with External Rotation  - 1 x daily - 7 x weekly - 1 sets - 10 reps ?

## 2022-01-08 ENCOUNTER — Encounter: Payer: Self-pay | Admitting: Physical Therapy

## 2022-01-08 ENCOUNTER — Ambulatory Visit: Payer: BC Managed Care – PPO | Attending: Internal Medicine | Admitting: Physical Therapy

## 2022-01-08 ENCOUNTER — Other Ambulatory Visit (HOSPITAL_COMMUNITY): Payer: Self-pay

## 2022-01-08 ENCOUNTER — Other Ambulatory Visit: Payer: Self-pay | Admitting: *Deleted

## 2022-01-08 DIAGNOSIS — R262 Difficulty in walking, not elsewhere classified: Secondary | ICD-10-CM | POA: Diagnosis not present

## 2022-01-08 DIAGNOSIS — C7931 Secondary malignant neoplasm of brain: Secondary | ICD-10-CM

## 2022-01-08 DIAGNOSIS — M6281 Muscle weakness (generalized): Secondary | ICD-10-CM | POA: Insufficient documentation

## 2022-01-08 DIAGNOSIS — R2681 Unsteadiness on feet: Secondary | ICD-10-CM | POA: Insufficient documentation

## 2022-01-08 DIAGNOSIS — R270 Ataxia, unspecified: Secondary | ICD-10-CM | POA: Diagnosis not present

## 2022-01-08 DIAGNOSIS — R2689 Other abnormalities of gait and mobility: Secondary | ICD-10-CM | POA: Insufficient documentation

## 2022-01-08 DIAGNOSIS — M25512 Pain in left shoulder: Secondary | ICD-10-CM | POA: Diagnosis not present

## 2022-01-08 DIAGNOSIS — R278 Other lack of coordination: Secondary | ICD-10-CM | POA: Insufficient documentation

## 2022-01-08 DIAGNOSIS — R41840 Attention and concentration deficit: Secondary | ICD-10-CM | POA: Diagnosis not present

## 2022-01-08 NOTE — Progress Notes (Signed)
Patients spouse called to advise that Physical Therapist requested addition of Occupational Therapy.  Order added. ?

## 2022-01-08 NOTE — Therapy (Signed)
[facility] ?[facility] ?[address] ?[place], [place], [address] ?Phone: [phone]   Fax:  [phone] ? ?Physical Therapy Treatment ? ?Patient Details  ?Name: Virginia [name] ?MRN: [id number] ?Date of Birth: 1956-04-22 ?Referring Provider (PT): vaslow ? ? ?Encounter Date: 01/08/2022 ? ? PT End of Session - 01/08/22 1401   ? ? Visit Number 3   ? Date for PT Re-Evaluation 03/26/22   ? Authorization Type 30V total/year   ? PT Start Time 1320   ? PT Stop Time 1358   ? PT Time Calculation (min) 38 min   ? Activity Tolerance Patient tolerated treatment well   ? Behavior During Therapy [facility] for tasks assessed/performed   ? ?  ?  ? ?  ? ? ?Past Medical History:  ?Diagnosis Date  ? Anemia   ? Anxiety   ? Concussion 09/28/2019  ? DVT (deep venous thrombosis) ([place]) 2021  ? L leg  ? Dyspnea   ? GERD (gastroesophageal reflux disease)   ? Hypercholesterolemia   ? per pt, she does not have elevated lipids  ? Hypertension   ? 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 PONV (postoperative nausea and vomiting)   ? Tobacco abuse   ? ? ?Past Surgical History:  ?Procedure Laterality Date  ? ABDOMINAL HYSTERECTOMY    ? partial/ left ovaries  ? APPLICATION OF CRANIAL NAVIGATION N/A 12/02/2020  ? Procedure: APPLICATION OF CRANIAL NAVIGATION;  Surgeon: Erline Levine, MD;  Location: [facility];  Service: Neurosurgery;  Laterality: N/A;  ? CHOLECYSTECTOMY    ? CRANIOTOMY N/A 12/02/2020  ? Procedure: Posterior fossa craniotomy for tumor resection with brainlab;  Surgeon: Erline Levine, MD;  Location: [facility];  Service: Neurosurgery;  Laterality: N/A;  ? DILATION AND CURETTAGE OF UTERUS    ? IR IMAGING GUIDED PORT INSERTION  10/23/2019  ? IR PATIENT EVAL TECH 0-60 MINS  11/21/2021  ? IR PATIENT EVAL TECH 0-60 MINS  11/24/2021  ? IR PATIENT EVAL TECH 0-60 MINS  11/29/2021  ? IR PATIENT EVAL TECH 0-60 MINS  12/04/2021  ? IR PATIENT EVAL TECH 0-60 MINS  12/12/2021  ? IR PATIENT EVAL TECH 0-60 MINS   12/19/2021  ? IR PATIENT EVAL TECH 0-60 MINS  12/27/2021  ? IR PATIENT EVAL TECH 0-60 MINS  01/03/2022  ? IR REMOVAL TUN ACCESS W/ PORT W/O FL MOD SED  11/17/2021  ? KYPHOPLASTY N/A 03/15/2020  ? Procedure: Thoracic Eight KYPHOPLASTY;  Surgeon: Erline Levine, MD;  Location: [facility];  Service: Neurosurgery;  Laterality: N/A;  prone ?  ? LIPOMA EXCISION  2018  ? removed under left breast and right thigh.  ? TUBAL LIGATION    ? ? ?There were no vitals filed for this visit. ? ? Subjective Assessment - 01/08/22 1321   ? ? Subjective I'm dizzy today. Its so bad I can't walk sometimes without my walker, The doctor sent me because of dizziness and getting out and moving around.   ? 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 How long can you walk comfortably? As long as she has something to hold onto.   ? Patient Stated Goals Be able to walk, control the dizziness.   ? Currently in Pain? Yes   ? Pain Score 7    ? Pain Location Back   ? Pain Orientation Right;Left   ? Pain Descriptors / Indicators Aching   ?  Pain Type Acute pain   ? ?  ?  ? ?  ? ? ? ? ? [facility] PT Assessment - 01/08/22 0001   ? ?  ? Observation/Other Assessments  ? Observations horizontal VORs with severe impairment; vertical VOR very mild impairment; unable to perform VOR cancellation/unable to stay on target.   ? ?  ?  ? ?  ? ? ? ? ? ? ? ? ? ? ? ? ? ? ? ? [place] Adult PT Treatment/Exercise - 01/08/22 0001   ? ?  ? Knee/Hip Exercises: Seated  ? Other Seated Knee/Hip Exercises VOR horizontal and vertical,  horizontal and vertical saccades 1x60 seconds all rounds   ? ?  ?  ? ?  ? ? ? ? ? ? ? ? ? ? PT Education - 01/08/22 1400   ? ? Education Details extensive education on possible origins of dizziness and VOR/central vertigo vs canals, canal anatomy and function of VOR reflex, how PT can work to treat both, VOR exercises/HEP; consider formal OT for shoulder so PT can focus on other moving parts   ? Person(s) Educated Patient   ? Methods Explanation;Handout   ?  Comprehension Verbalized understanding;Returned demonstration   ? ?  ?  ? ?  ? ? ? PT Short Term Goals - 01/04/22 1254   ? ?  ? PT SHORT TERM GOAL #1  ? Title I with initial HEP   ? Time 4   ? Period Weeks   ? Status On-going   ? Target Date 01/29/22   ?  ? PT SHORT TERM GOAL #2  ? Title Patient will perform 5x STS in < 20 seconds to demosntrate improved B LE strength and balance.   ? Baseline 25.37   ? Time 4   ? Period Weeks   ? Status New   ? Target Date 01/29/22   ?  ? PT SHORT TERM GOAL #3  ? Title Paitentwill climb up and down 3 stpes using B rails, S, safe, with no strain on LUE.   ? Baseline Difficulty climbing steps at home with B rails, L shoulder pain.   ? Time 4   ? Period Weeks   ? Status New   ? Target Date 01/29/22   ? ?  ?  ? ?  ? ? ? ? PT Long Term Goals - 01/01/22 1425   ? ?  ? PT LONG TERM GOAL #1  ? Title I with final HEP   ? Time 12   ? Period Weeks   ? Status New   ? Target Date 03/26/22   ?  ? PT LONG TERM GOAL #2  ? Title Paiten twill perform 5x STS in <12 seconds to demonstrate imporved BLE strength and improved balance.   ? Baseline 25.37   ? Time 12   ? Period Weeks   ? Status New   ? Target Date 03/26/22   ?  ? PT LONG TERM GOAL #3  ? Title Patient will complete normal TUG in < 13.5 seconds, with AD, to demonstrate decreased fall risk.   ? Baseline 16.87   ? Time 12   ? Period Weeks   ? Status New   ? Target Date 03/26/22   ?  ? PT LONG TERM GOAL #4  ? Title Patient will be able to walk at least 800' with LRAD, on level and unleve surefaces, with S.   ? Baseline 80', 4 WRW, CGA, level surfaces.   ?  Time 12   ? Period Weeks   ? Status New   ? Target Date 03/26/22   ?  ? PT LONG TERM GOAL #5  ? Title Patient will safely navigate up and down 12 steps using U or B rails, S, demosntrate safe technique that will not strain her L shoulder.   ? Baseline Patient reports she manages steps iwth difficulty, pain in L shoulder   ? Time 12   ? Period Weeks   ? Status New   ? Target Date 03/26/22   ? ?   ?  ? ?  ? ? ? ? ? ? ? ? Plan - 01/08/22 1402   ? ? Clinical Impression Statement Virginia [name] arrives today wanting to work on her dizziness. She does have severe horizontal VOR impairment, difficulty with VOR cancellation; we also screened for BPPV. She does have severe central dizziness and I also think some significant posterior canal BPPV too but this was very difficult to assess as she could not tolerated Epley or Semont positions. We can definitely treat the central origin vertigo. Needed some extra rest between tests due to dizziness and nausea.   ? Personal Factors and Comorbidities Comorbidity 1   ? Comorbidities Lung Ca with mets   ? Examination-Activity Limitations Bathing;Locomotion Level;Transfers;Bed Mobility;Reach Overhead;Bend;Carry;Squat;Stand;Lift   ? Examination-Participation Restrictions Meal Prep;Occupation;Driving   ? Stability/Clinical Decision Making Evolving/Moderate complexity   ? Clinical Decision Making Moderate   ? Rehab Potential Good   ? PT Frequency 2x / week   ? PT Duration 12 weeks   ? PT Treatment/Interventions ADLs/Self Care Home Management;Iontophoresis 4mg /ml Dexamethasone;Gait training;Neuromuscular re-education;Manual techniques;Functional mobility training;Stair training;Moist Heat;Electrical Stimulation;Cryotherapy;Balance training;Therapeutic exercise;Therapeutic activities;Patient/family education;Dry needling;Passive range of motion;Energy conservation   ? PT Next Visit Plan VOR work   ? PT Home Exercise Plan [email or web address]   ? Consulted and Agree with Plan of Care Patient   ? ?  ?  ? ?  ? ? ?Patient will benefit from skilled therapeutic intervention in order to improve the following deficits and impairments:  Abnormal gait, Decreased range of motion, Difficulty walking, Impaired UE functional use, Dizziness, Decreased activity tolerance, Pain, Decreased balance, Impaired flexibility, Improper body mechanics, Postural dysfunction, Decreased strength, Decreased mobility ? ?Visit  Diagnosis: ?Difficulty in walking, not elsewhere classified ? ?Muscle weakness (generalized) ? ?Unsteadiness on feet ? ?Other lack of coordination ? ? ? ? ?Problem List ?Patient Active Problem List  ? Diagnosis

## 2022-01-10 ENCOUNTER — Ambulatory Visit (HOSPITAL_COMMUNITY)
Admission: RE | Admit: 2022-01-10 | Discharge: 2022-01-10 | Disposition: A | Discharge status: Home / Self Care | Payer: BC Managed Care – PPO | Source: Ambulatory Visit | Attending: Radiology | Admitting: Radiology

## 2022-01-10 ENCOUNTER — Encounter (HOSPITAL_COMMUNITY): Payer: Self-pay | Admitting: Radiology

## 2022-01-10 DIAGNOSIS — Z4800 Encounter for change or removal of nonsurgical wound dressing: Secondary | ICD-10-CM | POA: Diagnosis not present

## 2022-01-10 DIAGNOSIS — C799 Secondary malignant neoplasm of unspecified site: Secondary | ICD-10-CM | POA: Insufficient documentation

## 2022-01-10 HISTORY — PX: IR PATIENT EVAL TECH 0-60 MINS: IMG5564

## 2022-01-10 NOTE — Progress Notes (Signed)
Patient ID: Virginia [name], female   DOB: 03/12/1956, 66 y.o.   MRN: [id number] ?Pt with history of metastatic lung cancer with recent right chest wall Port-A-Cath removal on 11/17/21 secondary to cellulitis. Blood cx were negative.  Patient has been followed in IR department since that time for hydrogel application and wound checks. On exam today patient still has wound defect at port pocket but improving/smaller in size.  There is no worsening erythema or drainage from area.  Site is nontender to touch.  Patient is afebrile. Her RUE PICC was removed on 4/11 per request from oncology.Once wound site has healed appropriately can consider new left sided port placement. Pt also given option of new PICC placement in interim should future peripheral access become cumbersome (currently receiving infusion at 3 week intervals). She prefers to continue with peripheral access for now for treatments. If wound does not heal in timely fashion may need to consider surgical consult. Pt received new application of hydrogel to port pocket today and new dressing was placed.  ?

## 2022-01-10 NOTE — Procedures (Signed)
Patient came today for a Port site check.  ?Please see Virginia [name] note from today. ?

## 2022-01-11 ENCOUNTER — Ambulatory Visit: Payer: BC Managed Care – PPO | Admitting: Occupational Therapy

## 2022-01-11 ENCOUNTER — Ambulatory Visit: Payer: BC Managed Care – PPO | Admitting: Hematology and Oncology

## 2022-01-11 ENCOUNTER — Ambulatory Visit: Payer: BC Managed Care – PPO | Admitting: Physical Therapy

## 2022-01-11 ENCOUNTER — Other Ambulatory Visit: Payer: BC Managed Care – PPO

## 2022-01-11 ENCOUNTER — Ambulatory Visit: Payer: BC Managed Care – PPO

## 2022-01-11 ENCOUNTER — Encounter: Payer: Self-pay | Admitting: Physical Therapy

## 2022-01-11 DIAGNOSIS — R2681 Unsteadiness on feet: Secondary | ICD-10-CM

## 2022-01-11 DIAGNOSIS — R262 Difficulty in walking, not elsewhere classified: Secondary | ICD-10-CM | POA: Diagnosis not present

## 2022-01-11 DIAGNOSIS — R278 Other lack of coordination: Secondary | ICD-10-CM | POA: Diagnosis not present

## 2022-01-11 DIAGNOSIS — M25512 Pain in left shoulder: Secondary | ICD-10-CM | POA: Diagnosis not present

## 2022-01-11 DIAGNOSIS — R270 Ataxia, unspecified: Secondary | ICD-10-CM

## 2022-01-11 DIAGNOSIS — M6281 Muscle weakness (generalized): Secondary | ICD-10-CM

## 2022-01-11 DIAGNOSIS — R2689 Other abnormalities of gait and mobility: Secondary | ICD-10-CM

## 2022-01-11 DIAGNOSIS — R41840 Attention and concentration deficit: Secondary | ICD-10-CM

## 2022-01-11 NOTE — Therapy (Signed)
[facility] ?[place], [place], [address] ?Phone: [phone]   Fax:  [phone] ? ?Physical Therapy Treatment ? ?Patient Details  ?Name: Virginia [name] ?MRN: [id number] ?Date of Birth: 13-Jul-1956 ?Referring Provider (PT): Virginia [name] ? ? ?Encounter Date: 01/11/2022 ? ? PT End of Session - 01/11/22 1359   ? ? Visit Number 4   ? Date for PT Re-Evaluation 03/26/22   ? Authorization Type 30V total/year   ? PT Start Time 8127   ? PT Stop Time 1356   ? PT Time Calculation (min) 39 min   ? Activity Tolerance Patient tolerated treatment well   ? Behavior During Therapy [facility] for tasks assessed/performed   ? ?  ?  ? ?  ? ? ?Past Medical History:  ?Diagnosis Date  ? Anemia   ? Anxiety   ? Concussion 09/28/2019  ? DVT (deep venous thrombosis) ([place]) 2021  ? L leg  ? Dyspnea   ? GERD (gastroesophageal reflux disease)   ? Hypercholesterolemia   ? per pt, she does not have elevated lipids  ? Hypertension   ? 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 PONV (postoperative nausea and vomiting)   ? Tobacco abuse   ? ? ?Past Surgical History:  ?Procedure Laterality Date  ? ABDOMINAL HYSTERECTOMY    ? partial/ left ovaries  ? APPLICATION OF CRANIAL NAVIGATION N/A 12/02/2020  ? Procedure: APPLICATION OF CRANIAL NAVIGATION;  Surgeon: Virginia Levine, MD;  Location: [place];  Service: Neurosurgery;  Laterality: N/A;  ? CHOLECYSTECTOMY    ? CRANIOTOMY N/A 12/02/2020  ? Procedure: Posterior fossa craniotomy for tumor resection with brainlab;  Surgeon: Virginia Levine, MD;  Location: [place];  Service: Neurosurgery;  Laterality: N/A;  ? DILATION AND CURETTAGE OF UTERUS    ? IR IMAGING GUIDED PORT INSERTION  10/23/2019  ? IR PATIENT EVAL TECH 0-60 MINS  11/21/2021  ? IR PATIENT EVAL TECH 0-60 MINS  11/24/2021  ? IR PATIENT EVAL TECH 0-60 MINS  11/29/2021  ? IR PATIENT EVAL TECH 0-60 MINS  12/04/2021  ? IR PATIENT EVAL TECH 0-60 MINS  12/12/2021  ? IR PATIENT EVAL TECH 0-60 MINS   12/19/2021  ? IR PATIENT EVAL TECH 0-60 MINS  12/27/2021  ? IR PATIENT EVAL TECH 0-60 MINS  01/03/2022  ? IR PATIENT EVAL TECH 0-60 MINS  01/10/2022  ? IR REMOVAL TUN ACCESS W/ PORT W/O FL MOD SED  11/17/2021  ? KYPHOPLASTY N/A 03/15/2020  ? Procedure: Thoracic Eight KYPHOPLASTY;  Surgeon: Virginia Levine, MD;  Location: [place];  Service: Neurosurgery;  Laterality: N/A;  prone ?  ? LIPOMA EXCISION  2018  ? removed under left breast and right thigh.  ? TUBAL LIGATION    ? ? ?There were no vitals filed for this visit. ? ? Subjective Assessment - 01/11/22 1319   ? ? Subjective I'm really dizzy today. The surgery got a mass out of my cerebellum. I'm tired, have a lot going on   ? 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 Patient Stated Goals Be able to walk, control the dizziness.   ? Currently in Pain? Yes   ? Pain Score 7    ? Pain Location Back   ? Pain Orientation Right;Left   ? Pain Descriptors / Indicators Aching   ? ?  ?  ? ?  ? ? ? ? ? ? ? ? ? ? ? ? ? ? ? ? ? ? ? ?  [facility] Adult PT Treatment/Exercise - 01/11/22 0001   ? ?  ? Knee/Hip Exercises: Aerobic  ? Nustep L4x6 minutes BLEs only   ?  ? Knee/Hip Exercises: Seated  ? Long [employer] Both;1 set;15 reps   ? Long [employer] Limitations red TB   ? Clamshell with TheraBand Red   1x15  ? Other Seated Knee/Hip Exercises VOR horizontal and vertical exercises 1x60 second each; VOR with  motion 1x60 seconds horizontal and vertical; saccades horizontal and vertical and diagonal 2x60 seconds   ? Sit to Sand 1 set;10 reps;without UE support   6#  ? ?  ?  ? ?  ? ? ? ? ? ? ? ? ? ? PT Education - 01/11/22 1358   ? ? Education Details anatomy of cerebellum and how this affects dizziness and motor control   ? Person(s) Educated Patient   ? Methods Explanation   ? Comprehension Verbalized understanding   ? ?  ?  ? ?  ? ? ? PT Short Term Goals - 01/04/22 1254   ? ?  ? PT SHORT TERM GOAL #1  ? Title I with initial HEP   ? Time 4   ? Period Weeks   ? Status On-going   ? Target Date  01/29/22   ?  ? PT SHORT TERM GOAL #2  ? Title Patient will perform 5x STS in < 20 seconds to demosntrate improved B LE strength and balance.   ? Baseline 25.37   ? Time 4   ? Period Weeks   ? Status New   ? Target Date 01/29/22   ?  ? PT SHORT TERM GOAL #3  ? Title Paitentwill climb up and down 3 stpes using B rails, S, safe, with no strain on LUE.   ? Baseline Difficulty climbing steps at home with B rails, L shoulder pain.   ? Time 4   ? Period Weeks   ? Status New   ? Target Date 01/29/22   ? ?  ?  ? ?  ? ? ? ? PT Long Term Goals - 01/01/22 1425   ? ?  ? PT LONG TERM GOAL #1  ? Title I with final HEP   ? Time 12   ? Period Weeks   ? Status New   ? Target Date 03/26/22   ?  ? PT LONG TERM GOAL #2  ? Title Paiten twill perform 5x STS in <12 seconds to demonstrate imporved BLE strength and improved balance.   ? Baseline 25.37   ? Time 12   ? Period Weeks   ? Status New   ? Target Date 03/26/22   ?  ? PT LONG TERM GOAL #3  ? Title Patient will complete normal TUG in < 13.5 seconds, with AD, to demonstrate decreased fall risk.   ? Baseline 16.87   ? Time 12   ? Period Weeks   ? Status New   ? Target Date 03/26/22   ?  ? PT LONG TERM GOAL #4  ? Title Patient will be able to walk at least 800' with LRAD, on level and unleve surefaces, with S.   ? Baseline 80', 4 WRW, CGA, level surfaces.   ? Time 12   ? Period Weeks   ? Status New   ? Target Date 03/26/22   ?  ? PT LONG TERM GOAL #5  ? Title Patient will safely navigate up and down 12 steps using U or  B rails, S, demosntrate safe technique that will not strain her L shoulder.   ? Baseline Patient reports she manages steps iwth difficulty, pain in L shoulder   ? Time 12   ? Period Weeks   ? Status New   ? Target Date 03/26/22   ? ?  ?  ? ?  ? ? ? ? ? ? ? ? Plan - 01/11/22 1400   ? ? Clinical Impression Statement Virginia [name] arrives today doing OK, more dizzy than the other day. We worked on a combination of VOR/vestibular work and Hotel manager today. Did well, still had  ongoing central VOR issues but actually they seemed improved since the first time I met her already. Will continue to progress as able and tolerated. MD put in OT order quickly which as great as now we can focus more on vestibular issues and functional mobility/strength since they are so closely related in her case. I think once her dizziness is better her balance and gait will really improve too.   ? Personal Factors and Comorbidities Comorbidity 1   ? Comorbidities Lung Ca with mets   ? Examination-Activity Limitations Bathing;Locomotion Level;Transfers;Bed Mobility;Reach Overhead;Bend;Carry;Squat;Stand;Lift   ? Examination-Participation Restrictions Meal Prep;Occupation;Driving   ? Stability/Clinical Decision Making Evolving/Moderate complexity   ? Clinical Decision Making Moderate   ? PT Frequency 2x / week   ? PT Duration 12 weeks   ? PT Treatment/Interventions ADLs/Self Care Home Management;Iontophoresis 4mg /ml Dexamethasone;Gait training;Neuromuscular re-education;Manual techniques;Functional mobility training;Stair training;Moist Heat;Electrical Stimulation;Cryotherapy;Balance training;Therapeutic exercise;Therapeutic activities;Patient/family education;Dry needling;Passive range of motion;Energy conservation   ? PT Next Visit Plan VOR work, strength and balance; OT working on UE/shoulder pain   ? PT Home Exercise Plan [id number]   ? Consulted and Agree with Plan of Care Patient   ? ?  ?  ? ?  ? ? ?Patient will benefit from skilled therapeutic intervention in order to improve the following deficits and impairments:  Abnormal gait, Decreased range of motion, Difficulty walking, Impaired UE functional use, Dizziness, Decreased activity tolerance, Pain, Decreased balance, Impaired flexibility, Improper body mechanics, Postural dysfunction, Decreased strength, Decreased mobility ? ?Visit Diagnosis: ?Difficulty in walking, not elsewhere classified ? ?Unsteadiness on feet ? ?Muscle weakness (generalized) ? ?Other  lack of coordination ? ? ? ? ?Problem List ?Patient Active Problem List  ? Diagnosis Date Noted  ? 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 Rash 11/14/2021  ? Hyponatremia 11/14/2021  ? Skin rash 11/13/2021  ? Hypokal

## 2022-01-11 NOTE — Therapy (Signed)
?OUTPATIENT OCCUPATIONAL THERAPY ?NEURO EVALUATION ? ?Patient Name: Virginia [name] ?MRN: [id number] ?DOB:Nov 10, 1955, 66 y.o., female ?Today's Date: 01/11/2022 ? ?PCP: Tedra Senegal, MD ?REFERRING PROVIDER: Ventura Sellers, MD ? ? OT End of Session - 01/11/22 1410   ? ? Visit Number 1   ? Number of Visits 13  ? Date for OT Re-Evaluation 03/12/2022  ? Authorization Type [employer]  ? Authorization Time Period VL: 30 combined OT/PT  ? Authorization - Visit Number 5  ? Authorization - Number of Visits 30  ? OT Start Time 1405   ? OT Stop Time 5465   ? OT Time Calculation (min) 40 min   ? Behavior During Therapy [facility] for tasks assessed/performed  ? ?  ?  ? ?  ? ?Past Medical History:  ?Diagnosis Date  ? Anemia   ? Anxiety   ? Concussion 09/28/2019  ? DVT (deep venous thrombosis) ([place]) 2021  ? L leg  ? Dyspnea   ? GERD (gastroesophageal reflux disease)   ? Hypercholesterolemia   ? per pt, she does not have elevated lipids  ? Hypertension   ? 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 PONV (postoperative nausea and vomiting)   ? Tobacco abuse   ? ?Past Surgical History:  ?Procedure Laterality Date  ? ABDOMINAL HYSTERECTOMY    ? partial/ left ovaries  ? APPLICATION OF CRANIAL NAVIGATION N/A 12/02/2020  ? Procedure: APPLICATION OF CRANIAL NAVIGATION;  Surgeon: Erline Levine, MD;  Location: [place];  Service: Neurosurgery;  Laterality: N/A;  ? CHOLECYSTECTOMY    ? CRANIOTOMY N/A 12/02/2020  ? Procedure: Posterior fossa craniotomy for tumor resection with brainlab;  Surgeon: Erline Levine, MD;  Location: [place];  Service: Neurosurgery;  Laterality: N/A;  ? DILATION AND CURETTAGE OF UTERUS    ? IR IMAGING GUIDED PORT INSERTION  10/23/2019  ? IR PATIENT EVAL TECH 0-60 MINS  11/21/2021  ? IR PATIENT EVAL TECH 0-60 MINS  11/24/2021  ? IR PATIENT EVAL TECH 0-60 MINS  11/29/2021  ? IR PATIENT EVAL TECH 0-60 MINS  12/04/2021  ? IR PATIENT EVAL TECH 0-60 MINS  12/12/2021  ? IR PATIENT EVAL TECH 0-60 MINS  12/19/2021  ? IR PATIENT EVAL  TECH 0-60 MINS  12/27/2021  ? IR PATIENT EVAL TECH 0-60 MINS  01/03/2022  ? IR PATIENT EVAL TECH 0-60 MINS  01/10/2022  ? IR REMOVAL TUN ACCESS W/ PORT W/O FL MOD SED  11/17/2021  ? KYPHOPLASTY N/A 03/15/2020  ? Procedure: Thoracic Eight KYPHOPLASTY;  Surgeon: Erline Levine, MD;  Location: [place];  Service: Neurosurgery;  Laterality: N/A;  prone ?  ? LIPOMA EXCISION  2018  ? removed under left breast and right thigh.  ? TUBAL LIGATION    ? ?Patient Active Problem List  ? Diagnosis Date Noted  ? 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 Rash 11/14/2021  ? Hyponatremia 11/14/2021  ? Skin rash 11/13/2021  ? Hypokalemia 11/13/2021  ? Febrile illness 11/13/2021  ? 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 Port-A-Cath in place 12/15/2020  ? Metastasis to brain [facility]) 12/02/2020  ? 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 Goals of care, counseling/discussion 02/25/2020  ? Palliative care patient 12/16/2019  ? Malignant neoplasm metastatic to bone ([place]) 10/21/2019  ? Malignant neoplasm metastatic to brain [facility]) 10/15/2019  ? Right lower lobe lung mass 09/25/2019  ? COPD (chronic obstructive pulmonary disease) ([place]) 09/25/2019  ? Impaired  glucose tolerance 12/08/2017  ? Vitamin D deficiency 12/08/2017  ? Microscopic hematuria 04/01/2014  ? Hypertension 01/11/2012  ? Palpitations 01/11/2012  ? Hyperlipidemia 12/25/2011  ? History of smoking 12/25/2011  ? ? ?ONSET DATE: 12/02/20 (date of craniotomy, tumor resection) ? ?REFERRING DIAG: ?C79.31 (ICD-10-CM) - Malignant neoplasm metastatic to brain [facility]) ?C79.31 (ICD-10-CM) - Brain metastases  ? ?THERAPY DIAG:  ?Ataxia ? ?Left shoulder pain, unspecified chronicity ? ?Muscle weakness (generalized) ? ?Other abnormalities of gait and mobility ? ?Attention and concentration deficit ? ?SUBJECTIVE:  ? ?SUBJECTIVE STATEMENT: ?Pt arrives to session w/ primary concerns related to limitations w/ balance and decreased coordination of LUE. Pt reports she was diagnosed  w/ lung cancer early in 2021 and then had a fall down the stairs w/ imaging in the hospital that revealed the brain metastasis. In March of last year, she underwent a craniotomy and resection of the cerebellar mass, which pt reports is when the concerning symptoms started. Pt also reports chronic back pain as a complication from another procedure and that she recently had her port removed due to concern for infection.  ?Pt accompanied by: self and husband, [name] ? 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 Other PMH includes history of LLE DVT, HTN, and anxiety ? ?PRECAUTIONS: Other: No driving ; active chemotherapy (tri-weekly) ? ?WEIGHT BEARING RESTRICTIONS No ? ?PAIN: ?Are you having pain? Yes: NPRS scale: 3/10 ?Pain location: lower back; middle ?Pain description: achy ?Aggravating factors: Standing ?Relieving factors: Laying down; heating pad ? ?FALLS: Has patient fallen in last 6 months? No ? ?LIVING ENVIRONMENT: ?Lives with: lives with their family ?Lives in: House/apartment ?Stairs: Yes: Internal: 15 steps; on right going up ?Has following equipment at home: Gilford Rile - 2 wheeled, Environmental consultant - 4 wheeled, and Wheelchair (manual) ? ?PLOF:  Independent prior to surgery 12/02/20 ? ?PATIENT GOALS: Improve balance and functional use of LUE ? ?OBJECTIVE:  ? ?HAND DOMINANCE: Right ? ?ADLs: ?Overall ADLs: requires most assistance w/ bilateral activities, FM tasks, and functional mobility/transfers ?Eating: needs assist cutting food ?Grooming: difficulty incorporating LUE ?UB Dressing: Mod I ?LB Dressing: requires assist threading BLEs ?Toileting: Mod I ?Bathing: Mod A; mostly completes bed/sponge bath, but sometimes uses the tub ?Tub Shower transfers: Min A; tub/shower combo ?Equipment: none ? ?IADLs: ?Shopping: dependent ?Light housekeeping: dependent ?Meal Prep: able to complete  simple snack prep ?Community mobility: relies on family/friends ?Medication management: husband manages medication; can mostly remember when to take them ?Financial management: husband currently managing finances ?Handwriting:  WFL ? ?MOBILITY STATUS: Needs Assist: CGA; use of RW and Hx of falls ? ?ACTIVITY TOLERANCE: ?Activity tolerance: Reports decreased endurance; mild dyspnea on exertion noted in evaluation session ? ?FUNCTIONAL OUTCOME MEASURES: ?To be further assessed ? ?UE ROM    ? ?Active ROM Right ?01/11/2022 Left ?01/11/2022  ?Shoulder flexion  114  ?Shoulder abduction  WFL  ?Shoulder adduction    ?Shoulder extension    ?Shoulder internal rotation    ?Shoulder external rotation    ?Elbow flexion    ?Elbow extension    ?Wrist flexion    ?Wrist extension    ?Wrist ulnar deviation    ?Wrist radial deviation    ?Wrist pronation    ?Wrist supination    ?(Blank rows = not tested) ? ?UE MMT:    ?Unable to assess due to L shoulder pain ? ?HAND FUNCTION: ?Grip strength: Right: 25 lbs;  Left: 17 lbs ? ?COORDINATION: ?Finger Nose Finger test: dysmetria and bradykinesia w/ LUE; RUE WFL ?Box and Blocks:  Right 24 blocks, Left 18 blocks ? ?SENSATION: ?WFL ? ?EDEMA: BLE edema reported; MD is monitoring ? ?COGNITION: ?Overall cognitive status:  Pt reports difficulty w/ memory and attention s/p craniotomy and resection in March 2022; to be further assessed in functional context ? ?VISION: ?Subjective report: Reports some dizziness ? ?VISION ASSESSMENT: ?Tracking/Visual pursuits: Able to track stimulus in all quads without difficulty ?Saccades: decreased speed of saccadic movements ?Convergence: Impaired: difficulty past midpoint ? ? ?PATIENT EDUCATION: ?Education details: Education provided on role and purpose of OT, as well as potential interventions and goals for therapy based on initial evaluation findings. Also demonstrated and provided education on use of reacher; handout administered for self-purchase ?Person educated:  Patient and Spouse ?Education method: Explanation, Demonstration, and Handouts ?Education comprehension: verbalized understanding and returned demonstration ? ? ?HOME EXERCISE PROGRAM: ?TBD ? ? ?GOALS: ?Goals re

## 2022-01-15 ENCOUNTER — Ambulatory Visit: Payer: BC Managed Care – PPO | Admitting: Occupational Therapy

## 2022-01-15 ENCOUNTER — Encounter: Payer: Self-pay | Admitting: Physical Therapy

## 2022-01-15 ENCOUNTER — Encounter: Payer: Self-pay | Admitting: Occupational Therapy

## 2022-01-15 ENCOUNTER — Ambulatory Visit: Payer: BC Managed Care – PPO | Admitting: Physical Therapy

## 2022-01-15 DIAGNOSIS — R41840 Attention and concentration deficit: Secondary | ICD-10-CM | POA: Diagnosis not present

## 2022-01-15 DIAGNOSIS — M25512 Pain in left shoulder: Secondary | ICD-10-CM

## 2022-01-15 DIAGNOSIS — R2689 Other abnormalities of gait and mobility: Secondary | ICD-10-CM

## 2022-01-15 DIAGNOSIS — R262 Difficulty in walking, not elsewhere classified: Secondary | ICD-10-CM | POA: Diagnosis not present

## 2022-01-15 DIAGNOSIS — R270 Ataxia, unspecified: Secondary | ICD-10-CM

## 2022-01-15 DIAGNOSIS — R278 Other lack of coordination: Secondary | ICD-10-CM

## 2022-01-15 DIAGNOSIS — M6281 Muscle weakness (generalized): Secondary | ICD-10-CM

## 2022-01-15 DIAGNOSIS — R2681 Unsteadiness on feet: Secondary | ICD-10-CM | POA: Diagnosis not present

## 2022-01-15 NOTE — Therapy (Signed)
[facility] ?[place], [place], [address] ?Phone: [phone]   Fax:  [phone] ? ?Physical Therapy Treatment ? ?Patient Details  ?Name: Virginia [name] ?MRN: [id number] ?Date of Birth: 01/11/1956 ?Referring Provider (PT): vaslow ? ? ?Encounter Date: 01/15/2022 ? ? PT End of Session - 01/15/22 1324   ? ? Visit Number 5   ? Date for PT Re-Evaluation 03/26/22   ? Authorization Type 30V total/year   ? PT Start Time 1232   ? PT Stop Time 1312   ? PT Time Calculation (min) 40 min   ? Activity Tolerance Patient tolerated treatment well   ? Behavior During Therapy [facility] for tasks assessed/performed   ? ?  ?  ? ?  ? ? ?Past Medical History:  ?Diagnosis Date  ? Anemia   ? Anxiety   ? Concussion 09/28/2019  ? DVT (deep venous thrombosis) ([place]) 2021  ? L leg  ? Dyspnea   ? GERD (gastroesophageal reflux disease)   ? Hypercholesterolemia   ? per pt, she does not have elevated lipids  ? Hypertension   ? 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 PONV (postoperative nausea and vomiting)   ? Tobacco abuse   ? ? ?Past Surgical History:  ?Procedure Laterality Date  ? ABDOMINAL HYSTERECTOMY    ? partial/ left ovaries  ? APPLICATION OF CRANIAL NAVIGATION N/A 12/02/2020  ? Procedure: APPLICATION OF CRANIAL NAVIGATION;  Surgeon: Erline Levine, MD;  Location: [facility];  Service: Neurosurgery;  Laterality: N/A;  ? CHOLECYSTECTOMY    ? CRANIOTOMY N/A 12/02/2020  ? Procedure: Posterior fossa craniotomy for tumor resection with brainlab;  Surgeon: Erline Levine, MD;  Location: [facility];  Service: Neurosurgery;  Laterality: N/A;  ? DILATION AND CURETTAGE OF UTERUS    ? IR IMAGING GUIDED PORT INSERTION  10/23/2019  ? IR PATIENT EVAL TECH 0-60 MINS  11/21/2021  ? IR PATIENT EVAL TECH 0-60 MINS  11/24/2021  ? IR PATIENT EVAL TECH 0-60 MINS  11/29/2021  ? IR PATIENT EVAL TECH 0-60 MINS  12/04/2021  ? IR PATIENT EVAL TECH 0-60 MINS  12/12/2021  ? IR PATIENT EVAL TECH 0-60 MINS   12/19/2021  ? IR PATIENT EVAL TECH 0-60 MINS  12/27/2021  ? IR PATIENT EVAL TECH 0-60 MINS  01/03/2022  ? IR PATIENT EVAL TECH 0-60 MINS  01/10/2022  ? IR REMOVAL TUN ACCESS W/ PORT W/O FL MOD SED  11/17/2021  ? KYPHOPLASTY N/A 03/15/2020  ? Procedure: Thoracic Eight KYPHOPLASTY;  Surgeon: Erline Levine, MD;  Location: [facility];  Service: Neurosurgery;  Laterality: N/A;  prone ?  ? LIPOMA EXCISION  2018  ? removed under left breast and right thigh.  ? TUBAL LIGATION    ? ? ?There were no vitals filed for this visit. ? ? Subjective Assessment - 01/15/22 1235   ? ? Subjective The dizziness continues. L shoulder pain continues to bother her.   ? 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 How long can you sit comfortably? N/A   ? How long can you stand comfortably? N/A-shaky   ? How long can you walk comfortably? As long as she has something to hold onto.   ? Patient Stated Goals Be able to walk, control the dizziness.   ? Currently in Pain? No/denies   ? Pain Onset In the past 7 days   ? ?  ?  ? ?  ? ? ? ? ? ? ? ? ? ? ? ? ? ? ? ? ? ? ? ?  [facility] Adult PT Treatment/Exercise - 01/15/22 0001   ? ?  ? Ambulation/Gait  ? Gait Comments Paotent ambulated 2 x 100' using Rollator and S, Vc to identify fixed point after each turn to minimize scanning as this causes increased dizziness. Patient reported decreased dizziness iwth gait wiht this technique.   ?  ? Knee/Hip Exercises: Seated  ? Other Seated Knee/Hip Exercises Saccades verticale x 60 sec, horizontal 2 x 60 sec., head turns horizontal x 60 seconds. Paiten treported increaesd dizziness after horizontal sacades, had to stop due to nausea.   ? Sit to Sand 2 sets;10 reps;without UE support   ?  ? Knee/Hip Exercises: Supine  ? Bridges Strengthening;Both;1 set;10 reps   ? Straight Leg Raises Strengthening;Both;1 set;10 reps   ? Straight Leg Raise with External Rotation Strengthening;Both;1 set;10 reps   ? ?  ?  ? ?  ? ? ? ? ? ? ? ? ? ? ? ? PT Short Term Goals - 01/15/22 1327    ? ?  ? PT SHORT TERM GOAL #1  ? Title I with initial HEP   ? Baseline Patine reports inconsistent performance.   ? Time 3   ? Period Weeks   ? Status On-going   ? Target Date 01/29/22   ?  ? PT SHORT TERM GOAL #2  ? Title Patient will perform 5x STS in < 20 seconds to demosntrate improved B LE strength and balance.   ? Baseline 25.37   ? Time 3   ? Period Weeks   ? Status On-going   ? Target Date 01/29/22   ?  ? PT SHORT TERM GOAL #3  ? Title Paitentwill climb up and down 3 stpes using B rails, S, safe, with no strain on LUE.   ? Baseline Difficulty climbing steps at home with B rails, L shoulder pain.   ? Time 3   ? Period Weeks   ? Status On-going   ? Target Date 01/29/22   ? ?  ?  ? ?  ? ? ? ? PT Long Term Goals - 01/01/22 1425   ? ?  ? PT LONG TERM GOAL #1  ? Title I with final HEP   ? Time 12   ? Period Weeks   ? Status New   ? Target Date 03/26/22   ?  ? PT LONG TERM GOAL #2  ? Title Paiten twill perform 5x STS in <12 seconds to demonstrate imporved BLE strength and improved balance.   ? Baseline 25.37   ? Time 12   ? Period Weeks   ? Status New   ? Target Date 03/26/22   ?  ? PT LONG TERM GOAL #3  ? Title Patient will complete normal TUG in < 13.5 seconds, with AD, to demonstrate decreased fall risk.   ? Baseline 16.87   ? Time 12   ? Period Weeks   ? Status New   ? Target Date 03/26/22   ?  ? PT LONG TERM GOAL #4  ? Title Patient will be able to walk at least 800' with LRAD, on level and unleve surefaces, with S.   ? Baseline 80', 4 WRW, CGA, level surfaces.   ? Time 12   ? Period Weeks   ? Status New   ? Target Date 03/26/22   ?  ? PT LONG TERM GOAL #5  ? Title Patient will safely navigate up and down 12 steps using U or B rails, S,  demosntrate safe technique that will not strain her L shoulder.   ? Baseline Patient reports she manages steps iwth difficulty, pain in L shoulder   ? Time 12   ? Period Weeks   ? Status New   ? Target Date 03/26/22   ? ?  ?  ? ?  ? ? ? ? ? ? ? ? Plan - 01/15/22 1325   ? ?  Clinical Impression Statement Patient reports L shoulder pain continues to bother her. OT reports she will further assess pain. Dizziness also continues. Patient reports she is performing HEP, but not consistently. emphasized some lower body strengthening and then continued with vestibular/visual re-education.   ? Personal Factors and Comorbidities Comorbidity 1   ? Comorbidities Lung Ca with mets   ? Examination-Activity Limitations Bathing;Locomotion Level;Transfers;Bed Mobility;Reach Overhead;Bend;Carry;Squat;Stand;Lift   ? Examination-Participation Restrictions Meal Prep;Occupation;Driving   ? Stability/Clinical Decision Making Evolving/Moderate complexity   ? Clinical Decision Making Moderate   ? Rehab Potential Good   ? PT Frequency 1x / week   ? PT Duration 12 weeks   ? PT Treatment/Interventions ADLs/Self Care Home Management;Iontophoresis 4mg /ml Dexamethasone;Gait training;Neuromuscular re-education;Manual techniques;Functional mobility training;Stair training;Moist Heat;Electrical Stimulation;Cryotherapy;Balance training;Therapeutic exercise;Therapeutic activities;Patient/family education;Dry needling;Passive range of motion;Energy conservation   ? PT Next Visit Plan VOR work, strength and balance; OT working on UE/shoulder pain   ? PT Home Exercise Plan [id number]   ? Consulted and Agree with Plan of Care Patient   ? ?  ?  ? ?  ? ? ?Patient will benefit from skilled therapeutic intervention in order to improve the following deficits and impairments:  Abnormal gait, Decreased range of motion, Difficulty walking, Impaired UE functional use, Dizziness, Decreased activity tolerance, Pain, Decreased balance, Impaired flexibility, Improper body mechanics, Postural dysfunction, Decreased strength, Decreased mobility ? ?Visit Diagnosis: ?Ataxia ? ?Left shoulder pain, unspecified chronicity ? ?Muscle weakness (generalized) ? ?Other abnormalities of gait and mobility ? ?Difficulty in walking, not elsewhere  classified ? ?Unsteadiness on feet ? ?Other lack of coordination ? ? ? ? ?Problem List ?Patient Active Problem List  ? Diagnosis Date Noted  ? 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 Rash 11/14/2021  ? Hyponatremia 11/15/18

## 2022-01-15 NOTE — Therapy (Signed)
?OUTPATIENT OCCUPATIONAL THERAPY ?TREATMENT NOTE ? ? ?Patient Name: Virginia [name] ?MRN: [id number] ?DOB:[birth year], 66 y.o., female ?Today's Date: 01/15/2022 ? ?PCP: Tedra Senegal, MD ?REFERRING PROVIDER: Ventura Sellers, MD ? ?END OF SESSION: ? ? OT End of Session - 01/15/22 1317   ? ? Visit Number 2   ? Number of Visits 13   ? Date for OT Re-Evaluation 03/12/22   ? Authorization Type BC[employer]   ? Authorization Time Period VL: 30 combined OT/PT   ? Authorization - Visit Number 7   ? Authorization - Number of Visits 30   ? OT Start Time 1315   ? OT Stop Time 1405   ? OT Time Calculation (min) 50 min   ? Activity Tolerance Other (comment)   Treatment limited by dizziness  ? Behavior During Therapy [facility] for tasks assessed/performed   ? ?  ?  ? ?  ? ?Past Medical History:  ?Diagnosis Date  ? Anemia   ? Anxiety   ? Concussion 09/28/2019  ? DVT (deep venous thrombosis) ([place]) 2021  ? L leg  ? Dyspnea   ? GERD (gastroesophageal reflux disease)   ? Hypercholesterolemia   ? per pt, she does not have elevated lipids  ? Hypertension   ? 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 PONV (postoperative nausea and vomiting)   ? Tobacco abuse   ? ?Past Surgical History:  ?Procedure Laterality Date  ? ABDOMINAL HYSTERECTOMY    ? partial/ left ovaries  ? APPLICATION OF CRANIAL NAVIGATION N/A 12/02/2020  ? Procedure: APPLICATION OF CRANIAL NAVIGATION;  Surgeon: Erline Levine, MD;  Location: [facility];  Service: Neurosurgery;  Laterality: N/A;  ? CHOLECYSTECTOMY    ? CRANIOTOMY N/A 12/02/2020  ? Procedure: Posterior fossa craniotomy for tumor resection with brainlab;  Surgeon: Erline Levine, MD;  Location: [facility];  Service: Neurosurgery;  Laterality: N/A;  ? DILATION AND CURETTAGE OF UTERUS    ? IR IMAGING GUIDED PORT INSERTION  10/23/2019  ? IR PATIENT EVAL TECH 0-60 MINS  11/21/2021  ? IR PATIENT EVAL TECH 0-60 MINS  11/24/2021  ? IR PATIENT EVAL TECH 0-60 MINS  11/29/2021  ? IR PATIENT EVAL TECH 0-60 MINS  12/04/2021  ? IR PATIENT  EVAL TECH 0-60 MINS  12/12/2021  ? IR PATIENT EVAL TECH 0-60 MINS  12/19/2021  ? IR PATIENT EVAL TECH 0-60 MINS  12/27/2021  ? IR PATIENT EVAL TECH 0-60 MINS  01/03/2022  ? IR PATIENT EVAL TECH 0-60 MINS  01/10/2022  ? IR REMOVAL TUN ACCESS W/ PORT W/O FL MOD SED  11/17/2021  ? KYPHOPLASTY N/A 03/15/2020  ? Procedure: Thoracic Eight KYPHOPLASTY;  Surgeon: Erline Levine, MD;  Location: [facility];  Service: Neurosurgery;  Laterality: N/A;  prone ?  ? LIPOMA EXCISION  2018  ? removed under left breast and right thigh.  ? TUBAL LIGATION    ? ?Patient Active Problem List  ? Diagnosis Date Noted  ? 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 Rash 11/14/2021  ? Hyponatremia 11/14/2021  ? Skin rash 11/13/2021  ? Hypokalemia 11/13/2021  ? Febrile illness 11/13/2021  ? 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 Port-A-Cath in place 12/15/2020  ? Metastasis to brain [facility]) 12/02/2020  ? 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 Goals of care, counseling/discussion 02/25/2020  ? Palliative care patient 12/16/2019  ? Malignant neoplasm metastatic to bone ([place]) 10/21/2019  ? Malignant neoplasm metastatic  to brain [facility]) 10/15/2019  ? Right lower lobe lung mass 09/25/2019  ? COPD (chronic obstructive pulmonary disease) ([place]) 09/25/2019  ? Impaired glucose tolerance 12/08/2017  ? Vitamin D deficiency 12/08/2017  ? Microscopic hematuria 04/01/2014  ? Hypertension 01/11/2012  ? Palpitations 01/11/2012  ? Hyperlipidemia 12/25/2011  ? History of smoking 12/25/2011  ? ? ?ONSET DATE: 12/02/20 (date of craniotomy, tumor resection) ?  ?REFERRING DIAG: ?C79.31 (ICD-10-CM) - Malignant neoplasm metastatic to brain [facility]) ?C79.31 (ICD-10-CM) - Brain metastases  ? ?THERAPY DIAG:  ?Ataxia ? ?Left shoulder pain, unspecified chronicity ? ?Muscle weakness (generalized) ? ?Other abnormalities of gait and mobility ? ?Attention and concentration deficit ? ? 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 Other PMH includes history of LLE DVT, HTN, and anxiety ? ?PRECAUTIONS: Active chemotherapy (tri-weekly); Other: No driving ? ?SUBJECTIVE:  ? ?SUBJECTIVE STATEMENTS: ?Pt reports feeling dizzy today; has been happening the past few days, but states it has been worse since starting therapy ? ?PAIN: ?Are you having pain? Yes: NPRS scale: 6/10 ?Pain location: lower back ?Pain description: achy ?Aggravating factors: Sitting or standing too long ?Relieving factors: Heat, laying down ? ? ?OBJECTIVE:  ? ?HAND FUNCTION: ?Grip strength: Right: 25 lbs; Left: 17 lbs ?  ?COORDINATION: ?Finger Nose Finger test: dysmetria and bradykinesia w/ LUE; RUE WFL ?Box and Blocks:  Right 24 blocks, Left 18 blocks ? ?VISION ASSESSMENT: ?Tracking/Visual pursuits: Able to track stimulus in all quads without difficulty ?Saccades: decreased speed of saccadic movements ?Convergence: Impaired: difficulty past midpoint ? ?TODAY'S TREATMENT: ?Picking up and rotating dice in-hand in-hand to place a specified number face-up on tabletop using in L fingertips only; completed 3 set(s) w/ 10 dice; success improved w/ repetition ?Coordination activities w/ LUE, including: rotating small ball w/ fingertips; flipping cards off a deck; rotating cards in-hand/turning cards end-over end; shuffling a deck of cards; picking up marbles and placing them into a container; picking up multiple marbles to see how many pt is able to hold in-hand without drops; picking up a small object and translating palm-to-fingertips; picking up multiple coins and translating palm to fingertips to place in a stack; walking a pen up/down between fingers and thumb ?Moist heat pack applied to lower back for 10 min to facilitate decreased pain/discomfort w/ positive results ? ?PATIENT EDUCATION: ?Education details: Provided education related to NMR and motor learning principles  related to control and coordination. Also discussed visual perception, current exercises pt is completing, and pt's concerns related to dizziness, answering pt and spouse's questions prn. Introduced [employer], broadcasting/film/video for [employer], providing relevant education on types of FM skills and additional options to target these within various tasks ?Person educated: Patient and Spouse ?Education method: Explanation, Demonstration, and Handouts ?Education comprehension: verbalized understanding and returned demonstration  ? ?HOME EXERCISE PROGRAM: ?Coordination activities for ~20 min, 1x/day (printed handout administered) ?  ?  ?GOALS: ?Goals reviewed with patient? Yes ?  ?SHORT TERM GOALS: Target date: 02/04/2022 ?  ?STG   Status:  ?1 Pt will demonstrate independence w/ visual compensatory strategies IN PROGRESS  ?2 Pt will verbalize understanding of fall prevention considerations ?Baseline: Decreased knowledge of fall prevention IN PROGRESS  ?  ?LONG TERM GOALS: Target date: 02/25/2022 ?  ?LTG   Status:  ?1 Pt will demonstrate independence w/ HEP designed for LUE GMC, strength, and coordination ?Baseline: No HEP at this time IN PROGRESS  ?2  Pt will demonstrate at least 120 degrees of L shoulder flexion within tolerable level of discomfort to improve participation in IADL tasks ?Baseline: 114 degrees IN PROGRESS  ?3 Pt will improve unilateral GMC of LUE as evidenced by increasing Box and Blocks score by at least 2 blocks ?Baseline: 18 blocks w/ LUE; 24 blocks w/ RUE IN PROGRESS  ?4 Pt will be able to complete full LB dressing w/ Mod I, incorporating AE prn, by d/c ?Baseline: Min A IN PROGRESS  ?5 Pt to improve UEFI score by d/c to indicate overall improvement w/ functional use of LUE during bilateral activities ?Baseline: to be assessed IN PROGRESS  ?  ?  ?ASSESSMENT: ?  ?CLINICAL IMPRESSION: ?Pt returns for initial treatment session after evaluation last week w/ c/o significant dizziness today. Notable horizontal nystagmus observed  every time pt glanced between OT and her husband. Due to this, OT focused on [facility] and coordination this session, reviewing and providing pertinent education related to options for activities that pt is able to compl

## 2022-01-17 ENCOUNTER — Other Ambulatory Visit (HOSPITAL_COMMUNITY): Payer: Self-pay | Admitting: Radiology

## 2022-01-17 ENCOUNTER — Ambulatory Visit (HOSPITAL_BASED_OUTPATIENT_CLINIC_OR_DEPARTMENT_OTHER): Payer: BC Managed Care – PPO | Attending: Hematology and Oncology | Admitting: Internal Medicine

## 2022-01-17 VITALS — Ht 62.0 in | Wt 163.0 lb

## 2022-01-17 DIAGNOSIS — G4736 Sleep related hypoventilation in conditions classified elsewhere: Secondary | ICD-10-CM | POA: Insufficient documentation

## 2022-01-17 DIAGNOSIS — G4733 Obstructive sleep apnea (adult) (pediatric): Secondary | ICD-10-CM

## 2022-01-17 DIAGNOSIS — Z9889 Other specified postprocedural states: Secondary | ICD-10-CM

## 2022-01-17 DIAGNOSIS — I6789 Other cerebrovascular disease: Secondary | ICD-10-CM | POA: Diagnosis not present

## 2022-01-17 DIAGNOSIS — R0683 Snoring: Secondary | ICD-10-CM | POA: Diagnosis not present

## 2022-01-18 ENCOUNTER — Other Ambulatory Visit: Payer: Self-pay

## 2022-01-18 ENCOUNTER — Other Ambulatory Visit (HOSPITAL_COMMUNITY): Payer: Self-pay | Admitting: Radiology

## 2022-01-18 ENCOUNTER — Inpatient Hospital Stay: Payer: BC Managed Care – PPO

## 2022-01-18 ENCOUNTER — Inpatient Hospital Stay: Payer: BC Managed Care – PPO | Attending: Hematology and Oncology | Admitting: Hematology and Oncology

## 2022-01-18 ENCOUNTER — Other Ambulatory Visit: Payer: Self-pay | Admitting: Hematology and Oncology

## 2022-01-18 ENCOUNTER — Encounter (HOSPITAL_COMMUNITY): Payer: Self-pay

## 2022-01-18 ENCOUNTER — Ambulatory Visit (HOSPITAL_COMMUNITY)
Admission: RE | Admit: 2022-01-18 | Discharge: 2022-01-18 | Disposition: A | Discharge status: Home / Self Care | Payer: BC Managed Care – PPO | Source: Ambulatory Visit | Attending: Radiology | Admitting: Radiology

## 2022-01-18 VITALS — BP 136/96 | HR 95

## 2022-01-18 VITALS — BP 133/101 | HR 103 | Temp 97.6°F | Resp 18 | Ht 62.0 in | Wt 164.0 lb

## 2022-01-18 DIAGNOSIS — R52 Pain, unspecified: Secondary | ICD-10-CM | POA: Diagnosis not present

## 2022-01-18 DIAGNOSIS — Z79899 Other long term (current) drug therapy: Secondary | ICD-10-CM | POA: Diagnosis not present

## 2022-01-18 DIAGNOSIS — Z5112 Encounter for antineoplastic immunotherapy: Secondary | ICD-10-CM | POA: Diagnosis not present

## 2022-01-18 DIAGNOSIS — R918 Other nonspecific abnormal finding of lung field: Secondary | ICD-10-CM | POA: Diagnosis not present

## 2022-01-18 DIAGNOSIS — Z809 Family history of malignant neoplasm, unspecified: Secondary | ICD-10-CM | POA: Diagnosis not present

## 2022-01-18 DIAGNOSIS — C3490 Malignant neoplasm of unspecified part of unspecified bronchus or lung: Secondary | ICD-10-CM | POA: Diagnosis not present

## 2022-01-18 DIAGNOSIS — C7951 Secondary malignant neoplasm of bone: Secondary | ICD-10-CM

## 2022-01-18 DIAGNOSIS — Z814 Family history of other substance abuse and dependence: Secondary | ICD-10-CM | POA: Diagnosis not present

## 2022-01-18 DIAGNOSIS — C3431 Malignant neoplasm of lower lobe, right bronchus or lung: Secondary | ICD-10-CM | POA: Insufficient documentation

## 2022-01-18 DIAGNOSIS — Z9889 Other specified postprocedural states: Secondary | ICD-10-CM

## 2022-01-18 DIAGNOSIS — I10 Essential (primary) hypertension: Secondary | ICD-10-CM | POA: Insufficient documentation

## 2022-01-18 DIAGNOSIS — Z7901 Long term (current) use of anticoagulants: Secondary | ICD-10-CM | POA: Diagnosis not present

## 2022-01-18 DIAGNOSIS — R0602 Shortness of breath: Secondary | ICD-10-CM | POA: Diagnosis not present

## 2022-01-18 DIAGNOSIS — E876 Hypokalemia: Secondary | ICD-10-CM | POA: Insufficient documentation

## 2022-01-18 DIAGNOSIS — C7931 Secondary malignant neoplasm of brain: Secondary | ICD-10-CM | POA: Diagnosis not present

## 2022-01-18 DIAGNOSIS — Z8249 Family history of ischemic heart disease and other diseases of the circulatory system: Secondary | ICD-10-CM | POA: Insufficient documentation

## 2022-01-18 DIAGNOSIS — Z5111 Encounter for antineoplastic chemotherapy: Secondary | ICD-10-CM | POA: Insufficient documentation

## 2022-01-18 DIAGNOSIS — Z87891 Personal history of nicotine dependence: Secondary | ICD-10-CM | POA: Insufficient documentation

## 2022-01-18 DIAGNOSIS — R110 Nausea: Secondary | ICD-10-CM | POA: Diagnosis not present

## 2022-01-18 DIAGNOSIS — Z86718 Personal history of other venous thrombosis and embolism: Secondary | ICD-10-CM | POA: Diagnosis not present

## 2022-01-18 DIAGNOSIS — Z4800 Encounter for change or removal of nonsurgical wound dressing: Secondary | ICD-10-CM | POA: Insufficient documentation

## 2022-01-18 DIAGNOSIS — R42 Dizziness and giddiness: Secondary | ICD-10-CM | POA: Insufficient documentation

## 2022-01-18 DIAGNOSIS — Z9049 Acquired absence of other specified parts of digestive tract: Secondary | ICD-10-CM | POA: Diagnosis not present

## 2022-01-18 DIAGNOSIS — Z95828 Presence of other vascular implants and grafts: Secondary | ICD-10-CM

## 2022-01-18 HISTORY — PX: IR PATIENT EVAL TECH 0-60 MINS: IMG5564

## 2022-01-18 LAB — CBC WITH DIFFERENTIAL (CANCER CENTER ONLY)
Abs Immature Granulocytes: 0.02 10*3/uL (ref 0.00–0.07)
Basophils Absolute: 0 10*3/uL (ref 0.0–0.1)
Basophils Relative: 1 %
Eosinophils Absolute: 0.2 10*3/uL (ref 0.0–0.5)
Eosinophils Relative: 4 %
HCT: 37.1 % (ref 36.0–46.0)
Hemoglobin: 12.6 g/dL (ref 12.0–15.0)
Immature Granulocytes: 0 %
Lymphocytes Relative: 29 %
Lymphs Abs: 1.7 10*3/uL (ref 0.7–4.0)
MCH: 34 pg (ref 26.0–34.0)
MCHC: 34 g/dL (ref 30.0–36.0)
MCV: 100 fL (ref 80.0–100.0)
Monocytes Absolute: 0.9 10*3/uL (ref 0.1–1.0)
Monocytes Relative: 16 %
Neutro Abs: 2.9 10*3/uL (ref 1.7–7.7)
Neutrophils Relative %: 50 %
Platelet Count: 319 10*3/uL (ref 150–400)
RBC: 3.71 MIL/uL — ABNORMAL LOW (ref 3.87–5.11)
RDW: 14.1 % (ref 11.5–15.5)
WBC Count: 5.7 10*3/uL (ref 4.0–10.5)
nRBC: 0 % (ref 0.0–0.2)

## 2022-01-18 LAB — CMP (CANCER CENTER ONLY)
ALT: 14 U/L (ref 0–44)
AST: 24 U/L (ref 15–41)
Albumin: 3.5 g/dL (ref 3.5–5.0)
Alkaline Phosphatase: 51 U/L (ref 38–126)
Anion gap: 7 (ref 5–15)
BUN: 9 mg/dL (ref 8–23)
CO2: 32 mmol/L (ref 22–32)
Calcium: 9.3 mg/dL (ref 8.9–10.3)
Chloride: 100 mmol/L (ref 98–111)
Creatinine: 0.72 mg/dL (ref 0.44–1.00)
GFR, Estimated: >60 mL/min (ref >60)
Glucose, Bld: 123 mg/dL — ABNORMAL HIGH (ref 70–99)
Potassium: 3.4 mmol/L — ABNORMAL LOW (ref 3.5–5.1)
Sodium: 139 mmol/L (ref 135–145)
Total Bilirubin: 0.3 mg/dL (ref 0.3–1.2)
Total Protein: 7 g/dL (ref 6.5–8.1)

## 2022-01-18 LAB — TSH: TSH: 1.78 u[IU]/mL (ref 0.350–4.500)

## 2022-01-18 MED ORDER — PROCHLORPERAZINE MALEATE 10 MG PO TABS
10.0000 mg | ORAL_TABLET | Freq: Once | ORAL | Status: AC
  Administered 2022-01-18: 10 mg via ORAL
  Filled 2022-01-18: qty 1

## 2022-01-18 MED ORDER — SODIUM CHLORIDE 0.9 % IV SOLN: Freq: Once | INTRAVENOUS | Status: AC

## 2022-01-18 MED ORDER — SODIUM CHLORIDE 0.9 % IV SOLN
200.0000 mg | Freq: Once | INTRAVENOUS | Status: AC
  Administered 2022-01-18: 200 mg via INTRAVENOUS
  Filled 2022-01-18: qty 200

## 2022-01-18 MED ORDER — ZOLEDRONIC ACID 4 MG/100ML IV SOLN
4.0000 mg | Freq: Once | INTRAVENOUS | Status: AC
  Administered 2022-01-18: 4 mg via INTRAVENOUS
  Filled 2022-01-18: qty 100

## 2022-01-18 MED ORDER — SODIUM CHLORIDE 0.9 % IV SOLN
500.0000 mg/m2 | Freq: Once | INTRAVENOUS | Status: AC
  Administered 2022-01-18: 900 mg via INTRAVENOUS
  Filled 2022-01-18: qty 20

## 2022-01-18 MED ORDER — SODIUM CHLORIDE 0.9 % IV SOLN: Freq: Once | INTRAVENOUS | Status: DC

## 2022-01-18 NOTE — Progress Notes (Signed)
?[name] ?Telephone:([phone]   [phone] ? ?PROGRESS NOTE ? ?Patient Care Team: ?Elby Showers, MD as PCP - General (Internal Medicine) ?Orson Slick, MD as PCP - Hematology/Oncology (Hematology and Oncology) ?Valrie Hart, RN as Oncology Nurse Navigator ?Acquanetta Chain, DO as Consulting Physician [facility] and Palliative Medicine) ? 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 CT of the head showed showed concerning for metastatic disease involving the left cerebellum and right occipital lobe.  ?2) 09/30/2019: MRI brain confirms mass within the left cerebellum measures 1.6 x 1.3 x 1.5 cm as well as at least 8 metastatic lesions within the supratentorial and infratentorial brain as outlined ?3) 10/01/2019: PET CT scan revealed hypermetabolic infrahilar right lower lobe mass with ?hypermetabolic nodal metastases in the right hilum, mediastinum, right supraclavicular region, left axilla and lower left neck. ?4) 10/08/2019: US guided biopsy of the lymph nodes confirms poorly differentiated non-small cell carcinoma. Immunohistochemistry confirms adenocarcinoma of lung primary. PD-L1 and NGS later revealed a MET Exon 14 mutation and TPS of 90%.  ?5) 10/12/2019: Establish care with Dr. Lorenso Courier  ?6) 11/04/2019: Day 1 of capmatinib 443m BID ?7) 11/09/2019: presented to Rad/Onc with a DVT in LLE. Seen in symptom management clinic and started on apixaban.  ?8) 12/29/2019: CT C/A/P showed interval decrease in size of mass involving the superior segment of right lower lobe and reduction in mediastinal and left supraclavicular adenopathy though some small spinal lesions were noted in the interim between the two sets of scans ?9) 01/25/2020: temporarily stopped capmatinib due to symptoms of nausea/fatigue. Restarted therapy on 01/30/2020 after brief chemo holiday.  ?10)  03/22/2020: CT C/A/P reveals a mixed picture, with new lung nodule and increased lymphadenopathy, however response in bone lesions. D/c capmatinib therapy ?11) 04/08/2020: start of Carbo/Pem/Pem for 2nd line treatment. Cycle 1 Day 1   ?12) 04/29/2020: Cycle 2 Day 1 Carbo/Pem/Pem  ?13) 05/20/2020: Cycle 3 Day 1 Carbo/Pem/Pem  ?14) 06/09/2020: Cycle 4 Day 1 Carbo/Pem/Pem  ?15) 06/29/2020: Cycle 5 Day 1 maintenance Pem/Pem  ?16) 07/20/2020: Cycle 6 Day 1 maintenance Pem/Pem  ?17) 08/12/2020: Cycle 7 Day 1 maintenance Pem/Pem  ?18) 09/01/2020: Cycle 8 Day 1 maintenance Pem/Pem  ?19) 09/21/2020: Cycle 9 Day 1 maintenance Pem/Pem  ?20) 10/13/2020: Cycle 10 Day 1 maintenance Pem/Pem  ?21) 11/03/2020: Cycle 11 Day 1 maintenance Pem/Pem  ?22) 11/25/2020:  Cycle 12 Day 1 maintenance Pem/Pem ?23) 12/15/2020:  Cycle 13 Day 1 maintenance Pem/Pem ?24) 01/05/2021:  Cycle 14 Day 1 maintenance Pem/Pem ?25) 01/26/2021:  Cycle 15 Day 1 maintenance Pem/Pem plus Bevacizumab.  ?02/17/2021: Cycle 16 Day 1 maintenance Pem/Pem plus Bevacizumab.  ?03/10/2021: Cycle 17 Day 1 maintenance Pem/Pem plus Bevacizumab.  ?03/31/2021: Cycle 18 Day 1 maintenance Pem/Pem  ?04/20/2021:  Cycle 19 Day 1 maintenance Pem/Pem  ?05/12/2021: Cycle 20 Day 1 maintenance Pem/Pem ?06/01/2021: Cycle 21 Day 1 maintenance Pem/Pem ?06/22/2021: Cycle 22 Day 1 maintenance Pem/Pem ?07/13/2021: Cycle 23 Day 1 maintenance Pem/Pem ?08/16/2021: Cycle 24 Day 1 maintenance Pem/Pem ?09/08/2021: Cycle 25 Day 1 maintenance Pem/Pem plus Bevacizumab ?09/28/2021: Cycle 26 Day 1 maintenance Pem/Pem plus Bevacizumab ?10/20/2021: Cycle 27 Day 1 maintenance Pem/Pem plus Bevacizumab ?11/09/2021: Cycle 28 Day 1 maintenance Pem/Pem plus Bevacizumab ?12/06/2021: Cycle 29 Day 1 maintenance Pem/Pem plus Bevacizumab ? ?Interval History:  ?Virginia POUNDS89y.o. female with medical history significant for metastatic adenocarcinoma of  the lung presents for a follow up visit. The patient's last visit was on 12/27/2021.  In  the interim since the last visit has continued receiving chemotherapy peripherally.  ? ?On exam today Virginia [name] is accompanied by her husband.  She reports she continues to have difficulty with dizziness and some issues with involuntary movements of her eyes.  She notes that she has been working with Dr. Mickeal Skinner and was given several exercises in order to try to help with this.  She reports that her PT has been going quite well.  She notes that she is also being set up for a home sleep study tonight.  Her port site wound is currently healing well and has been receiving her chemotherapy peripherally without difficulty.  Her pain is currently under good control with her regimen of Xtampza and oxycodone.  She denies any fevers, chills, night sweats, chest pain, cough, rash or neuropathy. She has no other complaints. Rest of the 10 point ROS is below.  She is willing and able to proceed with treatment at this time. ? ?MEDICAL HISTORY:  ?Past Medical History:  ?Diagnosis Date  ? Anemia   ? Anxiety   ? Concussion 09/28/2019  ? DVT (deep venous thrombosis) ([place]) 2021  ? L leg  ? Dyspnea   ? GERD (gastroesophageal reflux disease)   ? Hypercholesterolemia   ? per pt, she does not have elevated lipids  ? Hypertension   ? 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 PONV (postoperative nausea and vomiting)   ? Tobacco abuse   ? ? ?SURGICAL HISTORY: ?Past Surgical History:  ?Procedure Laterality Date  ? ABDOMINAL HYSTERECTOMY    ? partial/ left ovaries  ? APPLICATION OF CRANIAL NAVIGATION N/A 12/02/2020  ? Procedure: APPLICATION OF CRANIAL NAVIGATION;  Surgeon: Erline Levine, MD;  Location: [place];  Service: Neurosurgery;  Laterality: N/A;  ? CHOLECYSTECTOMY    ? CRANIOTOMY N/A 12/02/2020  ? Procedure: Posterior fossa craniotomy for tumor resection with brainlab;  Surgeon: Erline Levine, MD;  Location: [place];  Service: Neurosurgery;  Laterality: N/A;  ? DILATION AND CURETTAGE OF UTERUS    ? IR IMAGING GUIDED PORT  INSERTION  10/23/2019  ? IR PATIENT EVAL TECH 0-60 MINS  11/21/2021  ? IR PATIENT EVAL TECH 0-60 MINS  11/24/2021  ? IR PATIENT EVAL TECH 0-60 MINS  11/29/2021  ? IR PATIENT EVAL TECH 0-60 MINS  12/04/2021  ? IR PATIENT EVAL TECH 0-60 MINS  12/12/2021  ? IR PATIENT EVAL TECH 0-60 MINS  12/19/2021  ? IR PATIENT EVAL TECH 0-60 MINS  12/27/2021  ? IR PATIENT EVAL TECH 0-60 MINS  01/03/2022  ? IR PATIENT EVAL TECH 0-60 MINS  01/10/2022  ? IR PATIENT EVAL TECH 0-60 MINS  01/18/2022  ? IR REMOVAL TUN ACCESS W/ PORT W/O FL MOD SED  11/17/2021  ? KYPHOPLASTY N/A 03/15/2020  ? Procedure: Thoracic Eight KYPHOPLASTY;  Surgeon: Erline Levine, MD;  Location: [place];  Service: Neurosurgery;  Laterality: N/A;  prone ?  ? LIPOMA EXCISION  2018  ? removed under left breast and right thigh.  ? TUBAL LIGATION    ? ? ?ALLERGIES:  has No Known Allergies. ? ?MEDICATIONS:  ?Current Outpatient Medications  ?Medication Sig Dispense Refill  ? acetaminophen (TYLENOL) 500 MG tablet Take 1,000 mg by mouth every 8 (eight) hours as needed for moderate pain.    ? albuterol (VENTOLIN HFA) 108 (90 Base) MCG/ACT inhaler  INHALE 2 PUFFS INTO THE LUNGS EVERY 6 HOURS AS NEEDED FOR WHEEZING OR SHORTNESS OF BREATH 6.7 g 11  ? ALPRAZolam (XANAX) 0.25 MG tablet Take 1 tablet (0.25 mg total) by mouth 2 (two) times daily as needed for anxiety 60 tablet 1  ? Cyanocobalamin (VITAMIN B12 PO) Take 1 tablet by mouth daily. Gummies    ? EPINEPHrine 0.3 mg/0.3 mL IJ SOAJ injection Inject 0.3 mg into the muscle as needed for anaphylaxis. 2 each 0  ? folic acid (FOLVITE) 1 MG tablet Take 1 tablet by mouth daily. 90 tablet 1  ? furosemide (LASIX) 20 MG tablet Take 2 tablets (40 mg total) by mouth 2 times daily. 360 tablet 0  ? lidocaine-prilocaine (EMLA) cream Apply to port as needed. 30 g 0  ? meclizine (ANTIVERT) 25 MG tablet Take 1 tablet (25 mg total) by mouth 3 (three) times daily as needed for dizziness. 30 tablet 0  ? Multiple Vitamins-Minerals (AIRBORNE PO) Take 1 tablet by  mouth daily.     ? ofloxacin (OCUFLOX) 0.3 % ophthalmic solution 2 drops each eye 4 times a day for 5 days (Patient taking differently: Place 2 drops into both eyes daily as needed (irritation).) 5 mL 1  ? o

## 2022-01-18 NOTE — Procedures (Signed)
Patient came in today for a [name] site check. Her dressing was removed and the site was cleaned.  Additional Hydrogel was install in the wound and the site was covered with a guaze- Tegaderm dressing.  ?  ? ? ?

## 2022-01-18 NOTE — Patient Instructions (Signed)
[facility]  Discharge Instructions: ?Thank you for choosing [facility] to provide your oncology and hematology care.  ? ?If you have a lab appointment with the [facility], please go directly to the [facility] and check in at the registration area. ?  ?Wear comfortable clothing and clothing appropriate for easy access to any Portacath or PICC line.  ? ?We strive to give you quality time with your provider. You may need to reschedule your appointment if you arrive late (15 or more minutes).  Arriving late affects you and other patients whose appointments are after yours.  Also, if you miss three or more appointments without notifying the office, you may be dismissed from the clinic at the provider?s discretion.    ?  ?For prescription refill requests, have your pharmacy contact our office and allow 72 hours for refills to be completed.   ? ?Today you received the following chemotherapy and/or immunotherapy agents: Keytruda, Alimta.     ?  ?To help prevent nausea and vomiting after your treatment, we encourage you to take your nausea medication as directed. ? ?BELOW ARE SYMPTOMS THAT SHOULD BE REPORTED IMMEDIATELY: ?*FEVER GREATER THAN 100.4 F (38 ?C) OR HIGHER ?*CHILLS OR SWEATING ?*NAUSEA AND VOMITING THAT IS NOT CONTROLLED WITH YOUR NAUSEA MEDICATION ?*UNUSUAL SHORTNESS OF BREATH ?*UNUSUAL BRUISING OR BLEEDING ?*URINARY PROBLEMS (pain or burning when urinating, or frequent urination) ?*BOWEL PROBLEMS (unusual diarrhea, constipation, pain near the anus) ?TENDERNESS IN MOUTH AND THROAT WITH OR WITHOUT PRESENCE OF ULCERS (sore throat, sores in mouth, or a toothache) ?UNUSUAL RASH, SWELLING OR PAIN  ?UNUSUAL VAGINAL DISCHARGE OR ITCHING  ? ?Items with * indicate a potential emergency and should be followed up as soon as possible or go to the Emergency Department if any problems should occur. ? ?Please show the CHEMOTHERAPY ALERT CARD or IMMUNOTHERAPY ALERT CARD at  check-in to the Emergency Department and triage nurse. ? ?Should you have questions after your visit or need to cancel or reschedule your appointment, please contact [facility]  Dept: [phone]  and follow the prompts.  Office hours are 8:00 a.m. to 4:30 p.m. Monday - Friday. Please note that voicemails left after 4:00 p.m. may not be returned until the following business day.  We are closed weekends and major holidays. You have access to a nurse at all times for urgent questions. Please call the main number to the clinic Dept: [phone] and follow the prompts. ? ? ?For any non-urgent questions, you may also contact your provider using MyChart. We now offer e-Visits for anyone 27 and older to request care online for non-urgent symptoms. For details visit [email or web address]. ?  ?Also download the MyChart app! Go to the app store, search "MyChart", open the app, select [facility], and log in with your MyChart username and password. ? ?Due to Covid, a mask is required upon entering the hospital/clinic. If you do not have a mask, one will be given to you upon arrival. For doctor visits, patients may have 1 support person aged 13 or older with them. For treatment visits, patients cannot have anyone with them due to current Covid guidelines and our immunocompromised population.  ? ?Zoledronic Acid Injection (Cancer) ?What is this medication? ?ZOLEDRONIC ACID (ZOE le dron ik AS id) treats high calcium levels in the blood caused by cancer. It may also be used with chemotherapy to treat weakened bones caused by cancer. It works by slowing down the release  of calcium from bones. This lowers calcium levels in your blood. It also makes your bones stronger and less likely to break (fracture). It belongs to a group of medications called bisphosphonates. ?This medicine may be used for other purposes; ask your health care provider or pharmacist if you have questions. ?COMMON BRAND NAME(S):  Zometa, Zometa Powder ?What should I tell my care team before I take this medication? ?They need to know if you have any of these conditions: ?Dehydration ?Dental disease ?Kidney disease ?Liver disease ?Low levels of calcium in the blood ?Lung or breathing disease, such as asthma ?Receiving steroids, such as dexamethasone or prednisone ?An unusual or allergic reaction to zoledronic acid, other medications, foods, dyes, or preservatives ?Pregnant or trying to get pregnant ?Breast-feeding ?How should I use this medication? ?This medication is injected into a vein. It is given by your care team in a hospital or clinic setting. ?Talk to your care team about the use of this medication in children. Special care may be needed. ?Overdosage: If you think you have taken too much of this medicine contact a poison control center or emergency room at once. ?NOTE: This medicine is only for you. Do not share this medicine with others. ?What if I miss a dose? ?Keep appointments for follow-up doses. It is important not to miss your dose. Call your care team if you are unable to keep an appointment. ?What may interact with this medication? ?Certain antibiotics given by injection ?Diuretics, such as bumetanide, furosemide ?NSAIDs, medications for pain and inflammation, such as ibuprofen or naproxen ?Teriparatide ?Thalidomide ?This list may not describe all possible interactions. Give your health care provider a list of all the medicines, herbs, non-prescription drugs, or dietary supplements you use. Also tell them if you smoke, drink alcohol, or use illegal drugs. Some items may interact with your medicine. ?What should I watch for while using this medication? ?Visit your care team for regular checks on your progress. It may be some time before you see the benefit from this medication. ?Some people who take this medication have severe bone, joint, or muscle pain. This medication may also increase your risk for jaw problems or a broken  thigh bone. Tell your care team right away if you have severe pain in your jaw, bones, joints, or muscles. Tell you care team if you have any pain that does not go away or that gets worse. ?Tell your dentist and dental surgeon that you are taking this medication. You should not have major dental surgery while on this medication. See your dentist to have a dental exam and fix any dental problems before starting this medication. Take good care of your teeth while on this medication. Make sure you see your dentist for regular follow-up appointments. ?You should make sure you get enough calcium and vitamin D while you are taking this medication. Discuss the foods you eat and the vitamins you take with your care team. ?Check with your care team if you have severe diarrhea, nausea, and vomiting, or if you sweat a lot. The loss of too much body fluid may make it dangerous for you to take this medication. ?You may need bloodwork while taking this medication. ?Talk to your care team if you wish to become pregnant or think you might be pregnant. This medication can cause serious birth defects. ?What side effects may I notice from receiving this medication? ?Side effects that you should report to your care team as soon as possible: ?Allergic reactions--skin rash, itching,  hives, swelling of the face, lips, tongue, or throat ?Kidney injury--decrease in the amount of urine, swelling of the ankles, hands, or feet ?Low calcium level--muscle pain or cramps, confusion, tingling, or numbness in the hands or feet ?Osteonecrosis of the jaw--pain, swelling, or redness in the mouth, numbness of the jaw, poor healing after dental work, unusual discharge from the mouth, visible bones in the mouth ?Severe bone, joint, or muscle pain ?Side effects that usually do not require medical attention (report to your care team if they continue or are bothersome): ?Constipation ?Fatigue ?Fever ?Loss of appetite ?Nausea ?Stomach pain ?This list may not  describe all possible side effects. Call your doctor for medical advice about side effects. You may report side effects to FDA at 1-800-FDA-1088. ?Where should I keep my medication? ?This medication i

## 2022-01-18 NOTE — Progress Notes (Signed)
Patient ID: Virginia [name], female   DOB: 1955/11/21, 66 y.o.   MRN: [id number] ?Pt with history of metastatic lung cancer with recent right chest wall Port-A-Cath removal on 11/17/21 secondary to cellulitis. Blood cx were negative.  Patient has been followed in IR department since that time for hydrogel application and wound checks. On exam today patient still has wound defect at port pocket but improving/smaller in size.  There is no worsening erythema or drainage from area.  Site is nontender to touch.  Patient is afebrile. Her RUE PICC was removed on 4/11 per request from oncology.Once wound site has healed appropriately can consider new left sided port placement. Pt also given option of new PICC placement in interim should future peripheral access become cumbersome (currently receiving infusion at 3 week intervals). She prefers to continue with peripheral access for now for treatments. If wound does not heal in timely fashion may need to consider surgical consult. Pt received new application of hydrogel to port pocket today and new dressing was placed. She will f/u with IR again on 5/17. ?

## 2022-01-19 ENCOUNTER — Ambulatory Visit: Payer: BC Managed Care – PPO | Admitting: Physical Therapy

## 2022-01-19 ENCOUNTER — Ambulatory Visit: Payer: BC Managed Care – PPO | Admitting: Occupational Therapy

## 2022-01-20 ENCOUNTER — Other Ambulatory Visit: Payer: Self-pay | Admitting: Hematology and Oncology

## 2022-01-20 LAB — T4: T4, Total: 11.3 ug/dL (ref 4.5–12.0)

## 2022-01-21 ENCOUNTER — Encounter: Payer: Self-pay | Admitting: Hematology and Oncology

## 2022-01-22 ENCOUNTER — Telehealth: Payer: Self-pay

## 2022-01-22 ENCOUNTER — Ambulatory Visit: Payer: BC Managed Care – PPO | Admitting: Occupational Therapy

## 2022-01-22 ENCOUNTER — Ambulatory Visit: Payer: BC Managed Care – PPO | Admitting: Physical Therapy

## 2022-01-22 NOTE — Telephone Encounter (Signed)
Pt's husband called to report that pt is having chest congestion and a low grade fever. Skin is achy and sensitive to touch. Encouraged pt to call PCP for evaluation. ? ?Pt's husband also reports that patient is still experiencing pedal edema and feet hurt when first standing. Pt taking Lasix as ordered. Dr. Lorenso Courier aware. ?

## 2022-01-23 ENCOUNTER — Ambulatory Visit: Payer: BC Managed Care – PPO | Admitting: Occupational Therapy

## 2022-01-23 ENCOUNTER — Ambulatory Visit (INDEPENDENT_AMBULATORY_CARE_PROVIDER_SITE_OTHER): Payer: BC Managed Care – PPO | Admitting: Internal Medicine

## 2022-01-23 ENCOUNTER — Telehealth: Payer: Self-pay | Admitting: *Deleted

## 2022-01-23 ENCOUNTER — Other Ambulatory Visit (HOSPITAL_COMMUNITY): Payer: Self-pay | Admitting: Radiology

## 2022-01-23 ENCOUNTER — Encounter: Payer: Self-pay | Admitting: Hematology and Oncology

## 2022-01-23 ENCOUNTER — Encounter: Payer: Self-pay | Admitting: Internal Medicine

## 2022-01-23 ENCOUNTER — Ambulatory Visit (HOSPITAL_COMMUNITY)
Admission: RE | Admit: 2022-01-23 | Discharge: 2022-01-23 | Disposition: A | Discharge status: Home / Self Care | Payer: BC Managed Care – PPO | Source: Ambulatory Visit | Attending: Radiology | Admitting: Radiology

## 2022-01-23 ENCOUNTER — Other Ambulatory Visit (HOSPITAL_COMMUNITY): Payer: Self-pay

## 2022-01-23 ENCOUNTER — Telehealth: Payer: Self-pay | Admitting: Internal Medicine

## 2022-01-23 VITALS — BP 128/90 | HR 121 | Temp 99.6°F | Resp 18

## 2022-01-23 DIAGNOSIS — C7801 Secondary malignant neoplasm of right lung: Secondary | ICD-10-CM | POA: Diagnosis not present

## 2022-01-23 DIAGNOSIS — R059 Cough, unspecified: Secondary | ICD-10-CM

## 2022-01-23 DIAGNOSIS — J22 Unspecified acute lower respiratory infection: Secondary | ICD-10-CM

## 2022-01-23 DIAGNOSIS — Z4801 Encounter for change or removal of surgical wound dressing: Secondary | ICD-10-CM | POA: Diagnosis not present

## 2022-01-23 DIAGNOSIS — Z9889 Other specified postprocedural states: Secondary | ICD-10-CM

## 2022-01-23 DIAGNOSIS — Z4800 Encounter for change or removal of nonsurgical wound dressing: Secondary | ICD-10-CM | POA: Insufficient documentation

## 2022-01-23 DIAGNOSIS — R609 Edema, unspecified: Secondary | ICD-10-CM

## 2022-01-23 HISTORY — PX: IR RADIOLOGIST EVAL & MGMT: IMG5224

## 2022-01-23 MED ORDER — METHYLPREDNISOLONE ACETATE 80 MG/ML IJ SUSP
80.0000 mg | Freq: Once | INTRAMUSCULAR | Status: AC
  Administered 2022-01-23: 80 mg via INTRAMUSCULAR

## 2022-01-23 MED ORDER — XTAMPZA ER 13.5 MG PO C12A: 1.0000 | EXTENDED_RELEASE_CAPSULE | Freq: Two times a day (BID) | ORAL | 0 refills | Status: DC

## 2022-01-23 MED ORDER — AZITHROMYCIN 250 MG PO TABS: ORAL_TABLET | ORAL | 0 refills | Status: DC

## 2022-01-23 MED ORDER — OXYCODONE HCL 5 MG PO TABS: ORAL_TABLET | ORAL | 0 refills | Status: DC

## 2022-01-23 MED ORDER — CEFTRIAXONE SODIUM 1 G IJ SOLR
1.0000 g | Freq: Once | INTRAMUSCULAR | Status: AC
  Administered 2022-01-23: 1 g via INTRAMUSCULAR

## 2022-01-23 NOTE — Progress Notes (Signed)
   Subjective:    Patient ID: Virginia [name], female    DOB: 1955/10/29, 66 y.o.   MRN: [id number]  HPI  66 year old Female seen for cough, low-grade temperature of 99.8 degrees with achiness onset Saturday, May 13.   Home COVID test was negative.  She is having some edema in her feet.  She and her husband have contacted Oncology about that.  No travel history.  She has not around anyone that is sick that she knows of.  Continues to be followed at Oncology for metastatic non-small cell carcinoma of the lung.  Dr. Lorenso Courier is her Oncologist.  Lower extremity edema is being treated with Lasix 20 mg with directions 2 tablets twice daily.  On May 11, CBC showed white blood cell count of 5700, hemoglobin 12.6 g and platelet count 319,000.  She is on amlodipine 10 mg daily for hypertension and that is likely causing some dependent edema at that dose.  It takes that dose to control her blood pressure at this time however.  She is to have an MRI of her brain May 19.    Review of Systems see above -no shaking chills or high fever.  No nausea or vomiting.     Objective:   Physical Exam Vital signs reviewed.  Blood pressure 128/90.  Pulse is 121 regular temperature 99.6 degrees respiratory rate 18.  Pulse oximetry is 95%  Skin: Warm and dry.  TMs and pharynx are clear.  Neck is supple without cervical adenopathy.  No rales or wheezing appreciated on chest exam.  Cardiac exam: Tachycardia regular rate and rhythm without ectopy.  1+ edema of the lower extremities.       Assessment & Plan:  Acute lower respiratory infection-pulse oximetry 93% on room air.  Does not appear to be extremely tachypneic.  She is in a wheelchair.  Does not ambulate all that much.  History of metastatic non-small cell carcinoma of the lung treated by Oncology  Elevated blood pressure reading  Plan: Zithromax Z-PAK 2 tabs day 1 followed by 1 tab days 2 through 5.  She promises to call if symptoms not improving  within 24 to 48 hours or sooner if worse.  Monitor pulse oximetry at home.

## 2022-01-23 NOTE — Telephone Encounter (Signed)
Gardiner Fanti ?[phone] ? ?[name] called to say that [name] has Fever 99.8 today, cough, achy since Saturday, he is going to do a COVID test on her and call back with results. I went ahead and scheduled for 12:00 today. ? ?He also mentioned something about her edema in her feet and legs and said they had called oncology about that. He didn't know if you would want to treat her for that too. I let him know you may want to leave that to oncology. ?

## 2022-01-23 NOTE — Telephone Encounter (Signed)
Done

## 2022-01-23 NOTE — Procedures (Signed)
Pt presents to IR for weekly port site check after port removal 11/17/21.  ?Port pocket was flushed with saline, old hydrogel was removed from site. Wound culture was collected to see if there is any bacteria present since pocket has not healed or closed. Pocket is clean/pink with no signs of infection.  Pocket was filled with Hydrogel and covered with Telfa and paper tape.  ?We will notify patient of wound culture results. Per Dr. Serafina Royals if wound culture is negative, patient may be candidate for scar revision to close wound so it may heal.  ? ? ? ?Narda Rutherford, AGNP-BC ?01/23/2022, 4:27 PM ? ? ?

## 2022-01-23 NOTE — Telephone Encounter (Signed)
Patients spouse called about refill of medications but that was already processed.  No further action required. ?

## 2022-01-24 ENCOUNTER — Other Ambulatory Visit (HOSPITAL_COMMUNITY): Payer: BC Managed Care – PPO

## 2022-01-24 ENCOUNTER — Ambulatory Visit: Payer: BC Managed Care – PPO | Admitting: Physical Therapy

## 2022-01-25 ENCOUNTER — Telehealth: Payer: Self-pay | Admitting: Hematology and Oncology

## 2022-01-25 NOTE — Telephone Encounter (Signed)
.  Called patient to schedule appointment per 5/18 inbasket, patient is aware of date and time.   

## 2022-01-26 ENCOUNTER — Ambulatory Visit
Admission: RE | Admit: 2022-01-26 | Discharge: 2022-01-26 | Disposition: A | Discharge status: Home / Self Care | Payer: BC Managed Care – PPO | Source: Ambulatory Visit | Attending: Internal Medicine | Admitting: Internal Medicine

## 2022-01-26 DIAGNOSIS — C7931 Secondary malignant neoplasm of brain: Secondary | ICD-10-CM

## 2022-01-26 DIAGNOSIS — G936 Cerebral edema: Secondary | ICD-10-CM | POA: Diagnosis not present

## 2022-01-26 DIAGNOSIS — C719 Malignant neoplasm of brain, unspecified: Secondary | ICD-10-CM | POA: Diagnosis not present

## 2022-01-26 MED ORDER — GADOPENTETATE DIMEGLUMINE 469.01 MG/ML IV SOLN
15.0000 mL | Freq: Once | INTRAVENOUS | Status: AC | PRN
  Administered 2022-01-26: 15 mL via INTRAVENOUS

## 2022-01-27 DIAGNOSIS — R0683 Snoring: Secondary | ICD-10-CM

## 2022-01-27 LAB — AEROBIC CULTURE W GRAM STAIN (SUPERFICIAL SPECIMEN): Gram Stain: NONE SEEN

## 2022-01-27 NOTE — Procedures (Signed)
    Patient Name: Virginia [name], Virginia [name] Date: 01/17/2022 Gender: Female D.O.B: 04/25/1956 Age (years): 63 Referring Provider: Narda Rutherford MD Height (inches): 34 Interpreting Physician: Baird Lyons MD, ABSM Weight (lbs): 164 RPSGT: Gerhard Perches BMI: 30 MRN: [id number] Neck Size: 13.50  CLINICAL INFORMATION Sleep Study Type: HST Indication for sleep study: OSA Epworth Sleepiness Score: 13  SLEEP STUDY TECHNIQUE A multi-channel overnight portable sleep study was performed. The channels recorded were: nasal airflow, thoracic respiratory movement, and oxygen saturation with a pulse oximetry. Snoring was also monitored.  MEDICATIONS Patient self administered medications include: none reported.  SLEEP ARCHITECTURE Patient was studied for 368 minutes. The sleep efficiency was 99.9 % and the patient was supine for 0%. The arousal index was 0.0 per hour.  RESPIRATORY PARAMETERS The overall AHI was 104.0 per hour, with a central apnea index of 0 per hour. The oxygen nadir was 56% during sleep.  CARDIAC DATA Mean heart rate during sleep was 100.6 bpm.  IMPRESSIONS - Severe obstructive sleep apnea occurred during this study (AHI = 104.0/h). - Severe oxygen desaturation was noted during this study (Min O2 = 56%).Mean 90%. Time with O2 saturation 89% or less was 101 minutes. - Patient snored.  DIAGNOSIS - Obstructive Sleep Apnea (G47.33) - Nocturnal Hypoxemia (G47.36)  RECOMMENDATIONS - Suggest CPAP titration sleep study or autopap. Other options would be based on clinical judgment.. - Be careful with alcohol, sedatives and other CNS depressants that may worsen sleep apnea and disrupt normal sleep architecture. - Sleep hygiene should be reviewed to assess factors that may improve sleep quality. - Weight management and regular exercise should be initiated or continued.  [Electronically signed] 01/27/2022 11:25 AM  Baird Lyons MD, ABSM Diplomate, American Board of Sleep  Medicine NPI: [id number]                         [facility]  ELECTRONICALLY SIGNED ON:  01/27/2022, 11:21 AM [place] PH: [phone]   FX: [phone] [place]

## 2022-01-28 NOTE — Patient Instructions (Signed)
Take Zithromax Z-PAK 2 tabs day 1 followed by 1 tab days 2 through 5.  Rest and stay well-hydrated.  Call if symptoms not improving within 24 to 48 hours or sooner if worse.  Monitor pulse oximetry at home.

## 2022-01-29 ENCOUNTER — Other Ambulatory Visit: Payer: Self-pay

## 2022-01-29 ENCOUNTER — Inpatient Hospital Stay (HOSPITAL_BASED_OUTPATIENT_CLINIC_OR_DEPARTMENT_OTHER): Payer: BC Managed Care – PPO | Admitting: Internal Medicine

## 2022-01-29 ENCOUNTER — Inpatient Hospital Stay: Payer: BC Managed Care – PPO

## 2022-01-29 VITALS — BP 130/108 | HR 106 | Temp 97.7°F | Resp 18 | Ht 62.0 in | Wt 149.3 lb

## 2022-01-29 DIAGNOSIS — C3431 Malignant neoplasm of lower lobe, right bronchus or lung: Secondary | ICD-10-CM | POA: Diagnosis not present

## 2022-01-29 DIAGNOSIS — C7951 Secondary malignant neoplasm of bone: Secondary | ICD-10-CM | POA: Diagnosis not present

## 2022-01-29 DIAGNOSIS — R0602 Shortness of breath: Secondary | ICD-10-CM | POA: Diagnosis not present

## 2022-01-29 DIAGNOSIS — Z5111 Encounter for antineoplastic chemotherapy: Secondary | ICD-10-CM | POA: Diagnosis not present

## 2022-01-29 DIAGNOSIS — I10 Essential (primary) hypertension: Secondary | ICD-10-CM | POA: Diagnosis not present

## 2022-01-29 DIAGNOSIS — Z5112 Encounter for antineoplastic immunotherapy: Secondary | ICD-10-CM | POA: Diagnosis not present

## 2022-01-29 DIAGNOSIS — Z86718 Personal history of other venous thrombosis and embolism: Secondary | ICD-10-CM | POA: Diagnosis not present

## 2022-01-29 DIAGNOSIS — C7931 Secondary malignant neoplasm of brain: Secondary | ICD-10-CM | POA: Diagnosis not present

## 2022-01-29 DIAGNOSIS — Y842 Radiological procedure and radiotherapy as the cause of abnormal reaction of the patient, or of later complication, without mention of misadventure at the time of the procedure: Secondary | ICD-10-CM | POA: Diagnosis not present

## 2022-01-29 DIAGNOSIS — R52 Pain, unspecified: Secondary | ICD-10-CM | POA: Diagnosis not present

## 2022-01-29 DIAGNOSIS — Z79899 Other long term (current) drug therapy: Secondary | ICD-10-CM | POA: Diagnosis not present

## 2022-01-29 DIAGNOSIS — Z87891 Personal history of nicotine dependence: Secondary | ICD-10-CM | POA: Diagnosis not present

## 2022-01-29 DIAGNOSIS — R110 Nausea: Secondary | ICD-10-CM | POA: Diagnosis not present

## 2022-01-29 DIAGNOSIS — E876 Hypokalemia: Secondary | ICD-10-CM | POA: Diagnosis not present

## 2022-01-29 DIAGNOSIS — Z4800 Encounter for change or removal of nonsurgical wound dressing: Secondary | ICD-10-CM | POA: Diagnosis not present

## 2022-01-29 DIAGNOSIS — I6789 Other cerebrovascular disease: Secondary | ICD-10-CM | POA: Diagnosis not present

## 2022-01-29 DIAGNOSIS — R42 Dizziness and giddiness: Secondary | ICD-10-CM | POA: Diagnosis not present

## 2022-01-29 DIAGNOSIS — Z7901 Long term (current) use of anticoagulants: Secondary | ICD-10-CM | POA: Diagnosis not present

## 2022-01-29 NOTE — Progress Notes (Signed)
[facility] [place], [place] [address] [phone]   Interval Evaluation  Date of Service: 01/29/22 Patient Name: Virginia [name] Patient MRN: [id number] Patient DOB: 14-Aug-1956 Provider: Ventura Sellers, MD  Identifying Statement:  [name] is a 66 y.o. 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 Isidore Moos 12/02/20: Craniotomy, resection of progressive cerebellar mass by Dr. Vertell Limber.  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 She does continue to experience dizziness and vertigo, though not worse from prior.  No avastin since February.  Hasn't been working much with PT lately.  Continues on Pem/pem with Dr. DLorenso Courier  Swelling in feet still an issue but improved.  No seizures or headaches.    H+P (10/15/19) Patient presents to clinic to discuss recent neurologic symptoms and imaging findings.  She describes a fall while climbing stairs on 1/20; she hit the back of her and lost consciousness.  She is unclear whether she lost her balance or had mechanical provocation, but she had been drinking that evening.  She was treated initially as a trauma, CT demonstrated a lesion in the posterior fossa, later confirmed as a metastasis by MRI, along with 7 additional smaller lesions.  It took her almost two weeks to return for cognition and balance to return to "normal" which she feels she is about at right now.  Baseline she is high functioning and independent, works as a bNeurosurgeon  Has been taking decadron 250mtwice per day.  Plans to initiate chemotherapy soon with Dr. DoLorenso Courier Medications: Current Outpatient  Medications on File Prior to Visit  Medication Sig Dispense Refill   acetaminophen (TYLENOL) 500 MG tablet Take 1,000 mg by mouth every 8 (eight) hours as needed for moderate pain.     albuterol (VENTOLIN HFA) 108 (90 Base) MCG/ACT inhaler INHALE 2 PUFFS INTO THE LUNGS EVERY 6 HOURS AS NEEDED FOR WHEEZING OR SHORTNESS OF BREATH 6.7 g 11   ALPRAZolam (XANAX) 0.25 MG tablet Take 1 tablet (0.25 mg total) by mouth 2 (two) times daily as needed for anxiety 60 tablet 1   azithromycin (ZITHROMAX Z-PAK) 250 MG tablet Take 2 tablets (500 mg) on  Day 1,  followed by 1 tablet (250 mg) once daily on Days 2 through 5. 6 each 0   Cyanocobalamin (VITAMIN B12 PO) Take 1 tablet by mouth daily. Gummies     EPINEPHrine 0.3 mg/0.3 mL IJ SOAJ injection Inject 0.3 mg into the muscle as needed for anaphylaxis. 2 each 0   folic acid (FOLVITE) 1 MG tablet Take 1 tablet by mouth daily. 90 tablet 1   furosemide (LASIX) 20 MG tablet Take 2 tablets (40 mg total) by mouth 2 times daily. 360 tablet 0   lidocaine-prilocaine (EMLA) cream Apply to port as needed. 30 g 0   meclizine (ANTIVERT) 25 MG tablet Take 1 tablet (25 mg total) by mouth 3 (three) times daily as needed for dizziness. 30 tablet 0   Multiple Vitamins-Minerals (AIRBORNE PO) Take 1 tablet by mouth daily.      ofloxacin (OCUFLOX) 0.3 % ophthalmic solution 2 drops each eye 4 times a day for 5 days (Patient taking differently: Place  2 drops into both eyes daily as needed (irritation).) 5 mL 1   OLANZapine (ZYPREXA) 2.5 MG tablet TAKE 1 TABLET BY MOUTH AT BEDTIME 90 tablet 1   ondansetron (ZOFRAN) 4 MG tablet Take 1 tablet (4 mg total) by mouth every 8 (eight) hours as needed for nausea or vomiting. 40 tablet 2   oxyCODONE (OXY IR/ROXICODONE) 5 MG immediate release tablet TAKE 1 TO 2 TABLETS BY MOUTH EVERY 4 HOURS AS NEEDED FOR SEVERE PAIN 90 tablet 0   pantoprazole (PROTONIX) 40 MG tablet TAKE 1 TABLET BY MOUTH ONCE DAILY 90 tablet 3   polyethylene glycol (MIRALAX /  GLYCOLAX) 17 g packet Take 17 g by mouth every evening.     potassium chloride SA (KLOR-CON M) 20 MEQ tablet Take 2 tablets in the morning and 1 tablet in the evening. (Patient taking differently: 20 mEq 2 (two) times daily.) 90 tablet 2   Vitamin D3 (VITAMIN D) 25 MCG tablet Take 2,000 Units by mouth every evening.     vitamin E 180 MG (400 UNITS) capsule Take 400 IU daily x 1 week, then 400 IU BID (Patient taking differently: Take 400 Units by mouth daily.) 60 capsule 4   XTAMPZA ER 13.5 MG C12A Take 1 capsule by mouth in the morning and at bedtime. (every 12 hours) 60 capsule 0   No current facility-administered medications on file prior to visit.    Allergies: No Known Allergies Past Medical History:  Past Medical History:  Diagnosis Date   Anemia    Anxiety    Concussion 09/28/2019   DVT (deep venous thrombosis) ([facility]) 2021   L leg   Dyspnea    GERD (gastroesophageal reflux disease)    Hypercholesterolemia    per pt, she does not have elevated lipids   Hypertension    met lung ca dx'd 09/2019   mets to spine, hip and brain   PONV (postoperative nausea and vomiting)    Tobacco abuse    Past Surgical History:  Past Surgical History:  Procedure Laterality Date   ABDOMINAL HYSTERECTOMY     partial/ left ovaries   APPLICATION OF CRANIAL NAVIGATION N/A 12/02/2020   Procedure: APPLICATION OF CRANIAL NAVIGATION;  Surgeon: Erline Levine, MD;  Location: [facility];  Service: Neurosurgery;  Laterality: N/A;   CHOLECYSTECTOMY     CRANIOTOMY N/A 12/02/2020   Procedure: Posterior fossa craniotomy for tumor resection with brainlab;  Surgeon: Erline Levine, MD;  Location: [facility];  Service: Neurosurgery;  Laterality: N/A;   DILATION AND CURETTAGE OF UTERUS     IR IMAGING GUIDED PORT INSERTION  10/23/2019   IR PATIENT EVAL TECH 0-60 MINS  11/21/2021   IR PATIENT EVAL TECH 0-60 MINS  11/24/2021   IR PATIENT EVAL TECH 0-60 MINS  11/29/2021   IR PATIENT EVAL TECH 0-60 MINS  12/04/2021   IR PATIENT EVAL  TECH 0-60 MINS  12/12/2021   IR PATIENT EVAL TECH 0-60 MINS  12/19/2021   IR PATIENT EVAL TECH 0-60 MINS  12/27/2021   IR PATIENT EVAL TECH 0-60 MINS  01/03/2022   IR PATIENT EVAL TECH 0-60 MINS  01/10/2022   IR PATIENT EVAL TECH 0-60 MINS  01/18/2022   IR RADIOLOGIST EVAL & MGMT  01/23/2022   IR REMOVAL TUN ACCESS W/ PORT W/O FL MOD SED  11/17/2021   KYPHOPLASTY N/A 03/15/2020   Procedure: Thoracic Eight KYPHOPLASTY;  Surgeon: Erline Levine, MD;  Location: [facility];  Service: Neurosurgery;  Laterality: N/A;  prone  LIPOMA EXCISION  2018   removed under left breast and right thigh.   TUBAL LIGATION     Social History:  Social History   Socioeconomic History   Marital status: Married    Spouse name: Not on file   Number of children: 3   Years of education: Not on file   Highest education level: Not on file  Occupational History   Occupation: owns Academic librarian co    Employer: [employer]  Tobacco Use   Smoking status: Former    Packs/day: 0.25    Types: Cigarettes    Quit date: 10/31/2012    Years since quitting: 9.2   Smokeless tobacco: Never  Vaping Use   Vaping Use: Never used  Substance and Sexual Activity   Alcohol use: Yes    Alcohol/week: 0.0 standard drinks    Comment: rare   Drug use: No   Sexual activity: Yes  Other Topics Concern   Not on file  Social History Narrative   Not on file   Social Determinants of Health   Financial Resource Strain: Not on file  Food Insecurity: Not on file  Transportation Needs: Not on file  Physical Activity: Not on file  Stress: Not on file  Social Connections: Not on file  Intimate Partner Violence: Not on file   Family History:  Family History  Problem Relation Age of Onset   Heart disease Father    Drug abuse Daughter    Drug abuse Son    Cancer Sister    Heart disease Brother    Heart attack Brother     Review of Systems: Constitutional: Doesn't report fevers, chills or abnormal weight loss Eyes: Doesn't report  blurriness of vision Ears, nose, mouth, throat, and face: Doesn't report sore throat Respiratory: Doesn't report cough, dyspnea or wheezes Cardiovascular: Doesn't report palpitation, chest discomfort  Gastrointestinal:  Doesn't report nausea, constipation, diarrhea GU: Doesn't report incontinence Skin: Doesn't report skin rashes Neurological: Per HPI Musculoskeletal: Doesn't report joint pain Behavioral/Psych: Doesn't report anxiety  Physical Exam: Wt Readings from Last 3 Encounters:  01/18/22 164 lb (74.4 kg)  01/17/22 163 lb (73.9 kg)  12/27/21 164 lb 8 oz (74.6 kg)   Temp Readings from Last 3 Encounters:  01/23/22 99.6 F (37.6 C) (Tympanic)  01/18/22 97.6 F (36.4 C) (Tympanic)  12/27/21 97.6 F (36.4 C) (Tympanic)   BP Readings from Last 3 Encounters:  01/23/22 128/90  01/18/22 (!) 136/96  01/18/22 (!) 133/101   Pulse Readings from Last 3 Encounters:  01/23/22 (!) 121  01/18/22 95  01/18/22 (!) 103    KPS: 60. General: Wheelchair bound  Head: Normal EENT: No conjunctival injection or scleral icterus.  Lungs: Resp effort normal Cardiac: Regular rate Abdomen: Non-distended abdomen Skin: No rashes cyanosis or petechiae. Extremities: No clubbing or edema  Neurologic Exam: Mental Status: Awake, alert, attentive to examiner. Oriented to self and environment. Language is fluent with intact comprehension.  Cranial Nerves: Visual acuity is grossly normal. Noted dysarthria. Visual fields are full. Extra-ocular movements intact. No ptosis. Face is symmetric Motor: Tone and bulk are normal. Power is full in both arms and legs. Reflexes are symmetric, no pathologic reflexes present.  Coordination impaired bilaterally. Sensory: Intact to light touch Gait: Walker assisted  Labs: I have reviewed the data as listed    Component Value Date/Time   NA 139 01/18/2022 1048   K 3.4 (L) 01/18/2022 1048   CL 100 01/18/2022 1048   CO2 32 01/18/2022 1048  GLUCOSE 123 (H)  01/18/2022 1048   BUN 9 01/18/2022 1048   CREATININE 0.72 01/18/2022 1048   CREATININE 0.58 11/15/2017 0948   CALCIUM 9.3 01/18/2022 1048   PROT 7.0 01/18/2022 1048   ALBUMIN 3.5 01/18/2022 1048   AST 24 01/18/2022 1048   ALT 14 01/18/2022 1048   ALKPHOS 51 01/18/2022 1048   BILITOT 0.3 01/18/2022 1048   GFRNONAA >60 01/18/2022 1048   GFRNONAA 100 11/15/2017 0948   GFRAA >60 06/09/2020 1051   GFRAA 116 11/15/2017 0948   Lab Results  Component Value Date   WBC 5.7 01/18/2022   NEUTROABS 2.9 01/18/2022   HGB 12.6 01/18/2022   HCT 37.1 01/18/2022   MCV 100.0 01/18/2022   PLT 319 01/18/2022    Imaging:  [place] Clinician Interpretation: I have personally reviewed the CNS images as listed.  My interpretation, in the context of the patient's clinical presentation, is treatment effect vs true progression  MR BRAIN W WO CONTRAST  Result Date: 01/26/2022 CLINICAL DATA:  Brain/CNS neoplasm, assess treatment response. EXAM: MRI HEAD WITHOUT AND WITH CONTRAST TECHNIQUE: Multiplanar, multiecho pulse sequences of the brain and surrounding structures were obtained without and with intravenous contrast. CONTRAST:  38m MAGNEVIST GADOPENTETATE DIMEGLUMINE 469.01 MG/ML IV SOLN COMPARISON:  10/27/2021 FINDINGS: Brain: Small area of DWI hyperintensity at the base of the left middle cerebellar peduncle has decreased in size. There are chronic blood products overlying the cerebellar hemispheres. Status post resection of left cerebellar lesion. Mild edema surrounding the resection site is unchanged. Unchanged appearance of right cerebellar lesions. Areas of stippled contrast enhancement within the bilateral cerebellar hemispheres and the vermis have worsened. Punctate right parietal lesion is unchanged (series 11, image 97). Small left frontal precentral gyrus lesion has slightly increased in size to 3 mm (series 11, image 124). There is diffuse, smooth dural enhancement. Vascular: Major flow voids are  preserved. Skull and upper cervical spine: Normal calvarium and skull base. Visualized upper cervical spine and soft tissues are normal. Sinuses/Orbits:No paranasal sinus fluid levels or advanced mucosal thickening. No mastoid or middle ear effusion. Normal orbits. IMPRESSION: 1. Worsened areas of stippled contrast enhancement within both cerebellar hemispheres and vermis, in addition to increased size of left precentral gyrus lesion, together concerning for disease progression. 2. Unchanged appearance of right parietal lesion. 3. Diffuse, smooth dural enhancement, which could indicate intracranial hypotension. Electronically Signed   By: KUlyses JarredM.D.   On: 01/26/2022 21:39   IR PATIENT EVAL TECH 0-60 MINS  Result Date: 01/18/2022 GGavin Pound RT     01/18/2022  3:17 PM Patient came in today for a [name] site check. Her dressing was removed and the site was cleaned.  Additional Hydrogel was install in the wound and the site was covered with a guaze- Tegaderm dressing.    IR PATIENT EVAL TECH 0-60 MINS  Result Date: 01/10/2022 CWolfgang Phoenix    01/10/2022  2:11 PM Patient came today for a Port site check. Please see KArletha Grippenote from today.  IR PATIENT EVAL TECH 0-60 MINS  Result Date: 01/03/2022 CWolfgang [place]    01/03/2022  3:25 PM KAscencion DikePA-C saw patient in IR holding today for a Port a Cath site check.  Her dressing was removed and the site was cleaned.  Additional Hydrogel was install in the wound and the site was covered with a guaze- Tegaderm dressing.  She has an appointment in a week for another recheck.  Revision History  IR Radiologist Eval & Mgmt  Result Date: 01/24/2022 EXAM: ESTABLISHED PATIENT OFFICE VISIT CHIEF COMPLAINT: Infected port size status post removal 11/17/2021 Current Pain Level: 0 HISTORY OF PRESENT ILLNESS: Patient with history of infected port site status post removal on 11/17/2021. Port originally placed 10/23/2019. Wound care/site checks  performed weekly since removal. Today-port site check, patient reports mild tenderness with palpation. Site was flushed with normal saline gently dry with gauze. Old Hydrogel was removed and cleaned. Pocket is clean/pink with no signs of infection. Wound culture was sent to see if any infection is present. Pocket was filled with Hydrogel and covered with Telfa and paper tape. We will notify patient of wound culture results. Per Dr. Serafina Royals if wound culture is negative, patient may be candidate for scar revision to close wound so it can heal. REVIEW OF SYSTEMS: Not applicable PHYSICAL EXAMINATION: Not applicable ASSESSMENT AND PLAN: We will notify patient of wound culture results and plan for future care of port site. Read by: Narda Rutherford, AGNP-BC Electronically Signed   By: [name] M.D.   On: 01/24/2022 07:44   SLEEP STUDY DOCUMENTS  Result Date: 01/25/2022 Ordered by an unspecified provider.    Assessment/Plan Metastasis to brain [facility])  Radiation therapy induced brain necrosis   Ms. Goeden is clinically stable today from focal neurologic standpoint.  MRI brain demonstrates increase in contrast enhancement, though this is likely secondary to withdrawal of avastin.  Prior MRI was ~1 week after last avastin treatment.    She should remain off decadron.    Can resume norvasc 57m daily given HTN today.  [facility]  Will con't PT/OT for functional deficits.  We appreciate the opportunity to participate in the care of PMARQUIS DILES   We ask that PZENITA KISTERreturn to clinic in 3 months following next brain MRI, or sooner as needed given changes noted today.  All questions were answered. The patient knows to call the clinic with any problems, questions or concerns. No barriers to learning were detected.  The total time spent in the encounter was 30 minutes and more than 50% was on counseling and review of test results   ZVentura Sellers MD Medical Director of Neuro-Oncology [facility]05/22/23 11:21 AM

## 2022-01-30 ENCOUNTER — Telehealth: Payer: Self-pay | Admitting: Internal Medicine

## 2022-01-30 ENCOUNTER — Ambulatory Visit: Payer: BC Managed Care – PPO | Admitting: Occupational Therapy

## 2022-01-30 ENCOUNTER — Ambulatory Visit: Payer: BC Managed Care – PPO | Admitting: Physical Therapy

## 2022-01-30 ENCOUNTER — Other Ambulatory Visit (HOSPITAL_COMMUNITY): Payer: Self-pay

## 2022-01-30 NOTE — Telephone Encounter (Signed)
Per 5/22 los called pt and pt spouse confimred appointment.

## 2022-01-31 ENCOUNTER — Other Ambulatory Visit (HOSPITAL_COMMUNITY): Payer: Self-pay | Admitting: Interventional Radiology

## 2022-01-31 DIAGNOSIS — Z95828 Presence of other vascular implants and grafts: Secondary | ICD-10-CM

## 2022-02-01 ENCOUNTER — Other Ambulatory Visit: Payer: BC Managed Care – PPO

## 2022-02-01 ENCOUNTER — Ambulatory Visit: Payer: BC Managed Care – PPO

## 2022-02-01 ENCOUNTER — Other Ambulatory Visit: Payer: Self-pay | Admitting: Radiation Therapy

## 2022-02-01 ENCOUNTER — Ambulatory Visit: Payer: BC Managed Care – PPO | Admitting: Hematology and Oncology

## 2022-02-01 ENCOUNTER — Ambulatory Visit: Payer: BC Managed Care – PPO | Admitting: Occupational Therapy

## 2022-02-01 ENCOUNTER — Ambulatory Visit: Payer: BC Managed Care – PPO | Admitting: Physical Therapy

## 2022-02-06 ENCOUNTER — Encounter: Payer: Self-pay | Admitting: Physical Therapy

## 2022-02-06 ENCOUNTER — Ambulatory Visit: Payer: BC Managed Care – PPO | Admitting: Occupational Therapy

## 2022-02-06 ENCOUNTER — Encounter: Payer: Self-pay | Admitting: Occupational Therapy

## 2022-02-06 ENCOUNTER — Ambulatory Visit: Payer: BC Managed Care – PPO | Admitting: Physical Therapy

## 2022-02-06 DIAGNOSIS — R278 Other lack of coordination: Secondary | ICD-10-CM

## 2022-02-06 DIAGNOSIS — M6281 Muscle weakness (generalized): Secondary | ICD-10-CM

## 2022-02-06 DIAGNOSIS — R2681 Unsteadiness on feet: Secondary | ICD-10-CM

## 2022-02-06 DIAGNOSIS — R41840 Attention and concentration deficit: Secondary | ICD-10-CM | POA: Diagnosis not present

## 2022-02-06 DIAGNOSIS — R262 Difficulty in walking, not elsewhere classified: Secondary | ICD-10-CM

## 2022-02-06 DIAGNOSIS — R270 Ataxia, unspecified: Secondary | ICD-10-CM

## 2022-02-06 DIAGNOSIS — R2689 Other abnormalities of gait and mobility: Secondary | ICD-10-CM

## 2022-02-06 DIAGNOSIS — M25512 Pain in left shoulder: Secondary | ICD-10-CM

## 2022-02-06 NOTE — Therapy (Signed)
[facility] [address] [place], [place], [address] Phone: [phone]   Fax:  [phone]  Physical Therapy Treatment  Patient Details  Name: Virginia [name] MRN: [id number] Date of Birth: [birth year] Referring Provider (PT): vaslow   Encounter Date: 02/06/2022   PT End of Session - 02/06/22 1421     Visit Number 6    PT Start Time 1232    PT Stop Time 1312    PT Time Calculation (min) 40 min    Equipment Utilized During Treatment Gait belt    Activity Tolerance Patient tolerated treatment well    Behavior During Therapy [facility] for tasks assessed/performed             Past Medical History:  Diagnosis Date   Anemia    Anxiety    Concussion 09/28/2019   DVT (deep venous thrombosis) (HCC) 2021   L leg   Dyspnea    GERD (gastroesophageal reflux disease)    Hypercholesterolemia    per pt, she does not have elevated lipids   Hypertension    met lung ca dx'd 09/2019   mets to spine, hip and brain   PONV (postoperative nausea and vomiting)    Tobacco abuse     Past Surgical History:  Procedure Laterality Date   ABDOMINAL HYSTERECTOMY     partial/ left ovaries   APPLICATION OF CRANIAL NAVIGATION N/A 12/02/2020   Procedure: APPLICATION OF CRANIAL NAVIGATION;  Surgeon: Maeola Harman, MD;  Location: [facility] OR;  Service: Neurosurgery;  Laterality: N/A;   CHOLECYSTECTOMY     CRANIOTOMY N/A 12/02/2020   Procedure: Posterior fossa craniotomy for tumor resection with brainlab;  Surgeon: Maeola Harman, MD;  Location: [facility] OR;  Service: Neurosurgery;  Laterality: N/A;   DILATION AND CURETTAGE OF UTERUS     IR IMAGING GUIDED PORT INSERTION  10/23/2019   IR PATIENT EVAL TECH 0-60 MINS  11/21/2021   IR PATIENT EVAL TECH 0-60 MINS  11/24/2021   IR PATIENT EVAL TECH 0-60 MINS  11/29/2021   IR PATIENT EVAL TECH 0-60 MINS  12/04/2021   IR PATIENT EVAL TECH 0-60 MINS  12/12/2021   IR PATIENT EVAL TECH 0-60 MINS  12/19/2021   IR PATIENT EVAL  TECH 0-60 MINS  12/27/2021   IR PATIENT EVAL TECH 0-60 MINS  01/03/2022   IR PATIENT EVAL TECH 0-60 MINS  01/10/2022   IR PATIENT EVAL TECH 0-60 MINS  01/18/2022   IR RADIOLOGIST EVAL & MGMT  01/23/2022   IR REMOVAL TUN ACCESS W/ PORT W/O FL MOD SED  11/17/2021   KYPHOPLASTY N/A 03/15/2020   Procedure: Thoracic Eight KYPHOPLASTY;  Surgeon: Maeola Harman, MD;  Location: [facility] OR;  Service: Neurosurgery;  Laterality: N/A;  prone    LIPOMA EXCISION  2018   removed under left breast and right thigh.   TUBAL LIGATION      There were no vitals filed for this visit.   Subjective Assessment - 02/06/22 1236     Subjective The dizziness continues. She missed treatment due a bad sinus infection and leg swelling. Her Neuro Oncologist feels that her dizziness is caused by her cerebellar surgery vs her vestibular system.    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 N/A    How long can you stand comfortably? N/A-shaky    How long can you walk comfortably? As long as she has something to hold onto.  Patient Stated Goals Be able to walk, control the dizziness.    Currently in Pain? No/denies    Pain Onset In the past 7 days                               [facility] Adult PT Treatment/Exercise - 02/06/22 0001       Knee/Hip Exercises: Standing   Other Standing Knee Exercises Treatment focused on balance and movign without triggering her dizziness. Educatred her to identify a spot to focus on during gait, turning head up to 90 degrees and finding another stationary target to focus on before turning her body. she walked multiple times, including into distracting environemnts, multiple turns, without C/O increased dizziness. 200', 150', 100', with rollator, CGA.                     PT Education - 02/06/22 1420     Education Details Patient and husband educated to gait technqiues to decrease dizziness.    Person(s) Educated Spouse;Patient     Methods Explanation;Demonstration    Comprehension Returned demonstration;Verbalized understanding              PT Short Term Goals - 02/06/22 1424       PT SHORT TERM GOAL #1   Title I with initial HEP    Baseline Program may need modification    Time 3    Period Weeks    Status On-going    Target Date 01/29/22      PT SHORT TERM GOAL #2   Title Patient will perform 5x STS in < 20 seconds to demosntrate improved B LE strength and balance.    Baseline 25.37    Time 2    Period Weeks    Status On-going    Target Date 01/29/22      PT SHORT TERM GOAL #3   Title Paitentwill climb up and down 3 stpes using B rails, S, safe, with no strain on LUE.    Baseline Difficulty climbing steps at home with B rails, L shoulder pain.    Time 2    Period Weeks    Status On-going    Target Date 01/29/22               PT Long Term Goals - 01/01/22 1425       PT LONG TERM GOAL #1   Title I with final HEP    Time 12    Period Weeks    Status New    Target Date 03/26/22      PT LONG TERM GOAL #2   Title Paiten twill perform 5x STS in <12 seconds to demonstrate imporved BLE strength and improved balance.    Baseline 25.37    Time 12    Period Weeks    Status New    Target Date 03/26/22      PT LONG TERM GOAL #3   Title Patient will complete normal TUG in < 13.5 seconds, with AD, to demonstrate decreased fall risk.    Baseline 16.87    Time 12    Period Weeks    Status New    Target Date 03/26/22      PT LONG TERM GOAL #4   Title Patient will be able to walk at least 800' with LRAD, on level and unleve surefaces, with S.    Baseline 80', 4 WRW, CGA, level surfaces.  Time 12    Period Weeks    Status New    Target Date 03/26/22      PT LONG TERM GOAL #5   Title Patient will safely navigate up and down 12 steps using U or B rails, S, demosntrate safe technique that will not strain her L shoulder.    Baseline Patient reports she manages steps iwth difficulty,  pain in L shoulder    Time 12    Period Weeks    Status New    Target Date 03/26/22                   Plan - 02/06/22 1421     Clinical Impression Statement Patient reports continued dizziness. 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 Treamten focus shifted today to help patient use focused targets for her gaze while ambulating to decreased dizziness, with good results. Paiten tto try to carryover techniques at home.    Personal Factors and Comorbidities Comorbidity 1    Comorbidities Lung Ca with mets    Examination-Activity Limitations Bathing;Locomotion Level;Transfers;Bed Mobility;Reach Overhead;Bend;Carry;Squat;Stand;Lift    Examination-Participation Restrictions Meal Prep;Occupation;Driving    Stability/Clinical Decision Making Evolving/Moderate complexity    Clinical Decision Making Moderate    Rehab Potential Good    PT Frequency 1x / week    PT Duration 12 weeks    PT Treatment/Interventions ADLs/Self Care Home Management;Iontophoresis 4mg /ml Dexamethasone;Gait training;Neuromuscular re-education;Manual techniques;Functional mobility training;Stair training;Moist Heat;Electrical Stimulation;Cryotherapy;Balance training;Therapeutic exercise;Therapeutic activities;Patient/family education;Dry needling;Passive range of motion;Energy conservation    PT Next Visit Plan Assess effects of focused gaze on dizziness at home.    PT Home Exercise Plan [id number]    Consulted and Agree with Plan of Care Patient             Patient will benefit from skilled therapeutic intervention in order to improve the following deficits and impairments:  Abnormal gait, Decreased range of motion, Difficulty walking, Impaired UE functional use, Dizziness, Decreased activity tolerance, Pain, Decreased balance, Impaired flexibility, Improper body mechanics, Postural dysfunction, Decreased strength, Decreased mobility  Visit  Diagnosis: Ataxia  Muscle weakness (generalized)  Other abnormalities of gait and mobility  Attention and concentration deficit  Difficulty in walking, not elsewhere classified  Unsteadiness on feet  Other lack of coordination     Problem List Patient Active Problem List   Diagnosis Date Noted   Cancer associated pain 11/14/2021   Rash 11/14/2021   Hyponatremia 11/14/2021   Skin rash 11/13/2021   Hypokalemia 11/13/2021   Febrile illness 11/13/2021   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   Port-A-Cath in place 12/15/2020   Metastasis to brain ([place]) 12/02/2020   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 ([place]) 03/15/2020   Goals of care, counseling/discussion 02/25/2020   Palliative care patient 12/16/2019   Malignant neoplasm metastatic to bone ([place]) 10/21/2019   Malignant neoplasm metastatic to brain ([place]) 10/15/2019   Right lower lobe lung mass 09/25/2019   COPD (chronic obstructive pulmonary disease) ([place]) 09/25/2019   Impaired glucose tolerance 12/08/2017   Vitamin D deficiency 12/08/2017   Microscopic hematuria 04/01/2014   Hypertension 01/11/2012   Palpitations 01/11/2012   Hyperlipidemia 12/25/2011   History of smoking 12/25/2011    Marcelina Morel, DPT 02/06/2022, 2:26 PM  [facility] [place]. [place], [place], [address] Phone: [phone]   Fax:  [phone]  Name: XARENI KELCH MRN: [id number]  Date of Birth: 07-16-1956

## 2022-02-06 NOTE — Therapy (Unsigned)
OUTPATIENT OCCUPATIONAL THERAPY TREATMENT NOTE   Patient Name: ABBIE [name] MRN: [id number] DOB:1956/01/25, 66 y.o., female Today's Date: 02/06/2022  PCP: Virginia Senegal, MD REFERRING PROVIDER: Ventura Sellers, MD  END OF SESSION:   OT End of Session - 02/06/22 1320     Visit Number 3    Number of Visits 13    Date for OT Re-Evaluation 03/12/22    Authorization Type BC[employer]    Authorization Time Period VL: 85 combined OT/PT    Authorization - Visit Number 9    Authorization - Number of Visits 30    OT Start Time 8719    OT Stop Time 5974    OT Time Calculation (min) 44 min    Activity Tolerance Other (comment)   Treatment limited by dizziness   Behavior During Therapy [facility] for tasks assessed/performed            Past Medical History:  Diagnosis Date   Anemia    Anxiety    Concussion 09/28/2019   DVT (deep venous thrombosis) ([place]) 2021   L leg   Dyspnea    GERD (gastroesophageal reflux disease)    Hypercholesterolemia    per pt, she does not have elevated lipids   Hypertension    met lung ca dx'd 09/2019   mets to spine, hip and brain   PONV (postoperative nausea and vomiting)    Tobacco abuse    Past Surgical History:  Procedure Laterality Date   ABDOMINAL HYSTERECTOMY     partial/ left ovaries   APPLICATION OF CRANIAL NAVIGATION N/A 12/02/2020   Procedure: APPLICATION OF CRANIAL NAVIGATION;  Surgeon: Virginia Levine, MD;  Location: [place];  Service: Neurosurgery;  Laterality: N/A;   CHOLECYSTECTOMY     CRANIOTOMY N/A 12/02/2020   Procedure: Posterior fossa craniotomy for tumor resection with brainlab;  Surgeon: Virginia Levine, MD;  Location: [place];  Service: Neurosurgery;  Laterality: N/A;   DILATION AND CURETTAGE OF UTERUS     IR IMAGING GUIDED PORT INSERTION  10/23/2019   IR PATIENT EVAL TECH 0-60 MINS  11/21/2021   IR PATIENT EVAL TECH 0-60 MINS  11/24/2021   IR PATIENT EVAL TECH 0-60 MINS  11/29/2021   IR PATIENT EVAL TECH 0-60 MINS  12/04/2021   IR PATIENT  EVAL TECH 0-60 MINS  12/12/2021   IR PATIENT EVAL TECH 0-60 MINS  12/19/2021   IR PATIENT EVAL TECH 0-60 MINS  12/27/2021   IR PATIENT EVAL TECH 0-60 MINS  01/03/2022   IR PATIENT EVAL TECH 0-60 MINS  01/10/2022   IR PATIENT EVAL TECH 0-60 MINS  01/18/2022   IR RADIOLOGIST EVAL & MGMT  01/23/2022   IR REMOVAL TUN ACCESS W/ PORT W/O FL MOD SED  11/17/2021   KYPHOPLASTY N/A 03/15/2020   Procedure: Thoracic Eight KYPHOPLASTY;  Surgeon: Virginia Levine, MD;  Location: [facility];  Service: Neurosurgery;  Laterality: N/A;  prone    LIPOMA EXCISION  2018   removed under left breast and right thigh.   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 ([place]) 03/15/2020   Goals of care, counseling/discussion 02/25/2020  Palliative care patient 12/16/2019   Malignant neoplasm metastatic to bone ([place]) 10/21/2019   Malignant neoplasm metastatic to brain ([place]) 10/15/2019   Right lower lobe lung mass 09/25/2019   COPD (chronic obstructive pulmonary disease) ([place]) 09/25/2019   Impaired glucose tolerance 12/08/2017   Vitamin D deficiency 12/08/2017   Microscopic hematuria 04/01/2014   Hypertension 01/11/2012   Palpitations 01/11/2012   Hyperlipidemia 12/25/2011   History of smoking 12/25/2011    ONSET DATE: 12/02/20 (date of craniotomy, tumor resection)   REFERRING DIAG: C79.31 (ICD-10-CM) - Malignant neoplasm metastatic to brain ([place]) C79.31 (ICD-10-CM) - Brain metastases   THERAPY DIAG:  Ataxia  Left shoulder pain, unspecified chronicity  Muscle weakness (generalized)  Other abnormalities of gait and mobility  Attention and concentration  deficit   PERTINENT HISTORY: Metastatic adenocarcinoma of the lung, 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 Other PMH includes history of LLE DVT, HTN, and anxiety  PRECAUTIONS: Active chemotherapy (tri-weekly); Other: No driving  SUBJECTIVE:   SUBJECTIVE STATEMENT: Pt reports no significant change in dizziness or shoulder pain since prior OP visit on 01/15/22. Also reports having follow-up appointments w/ Dr. Lorenso [name] and Dr. Mickeal [name] that went well.  PAIN: Are you having pain? Yes: NPRS scale: 6/10 Pain location: lower back Pain description: achy Aggravating factors: Sitting or standing too long Relieving factors: Heat, laying down   OBJECTIVE:   HAND FUNCTION: Grip strength: Right: 25 lbs; Left: 17 lbs   COORDINATION: Finger Nose Finger test: dysmetria and bradykinesia w/ LUE; RUE WFL Box and Blocks:  Right 24 blocks, Left 18 blocks  VISION ASSESSMENT: Tracking/Visual pursuits: Able to track stimulus in all quads without difficulty Saccades: decreased speed of saccadic movements Convergence: Impaired: difficulty past midpoint  TODAY'S TREATMENT: AROM of bilateral scapular elevation and retraction completed sitting EOM; OT incorporated visual aid (mirror) to facilitate midline positioning and equal activation w/ good results; completed x10 each slowly Transitioned from sitting EOM to supine w/ SPV; requiring rest break prior to initiating exercises in supine due to dizziness BUE chest press in supine position w/ 2 lb dowel rod x15; completed 2nd set w/ 5 lb dowel rod. Pt reported no shoulder pain throughout exercise w/ increased ataxia observed when returning shoulder to midline. Slight increase in [place] observed w/ increased weight Single-arm R shoulder chest press in supine position w/out resistance; completed 2x10 slowly w/ OT providing verbal/tactile cues for alignment and pacing Gross grasp of  putty (red, med-soft) w/ each hand, focusing on attempting movement pattern against resistance opposed to straining to force movement   PATIENT EDUCATION: Provided education related to NMR and motor learning principles related to control and coordination, including benefit of increased somatosensory input and importance of scapular alignment and stability in preparation for UE movement. Person educated: Patient and Spouse Education method: Customer service manager Education comprehension: verbalized understanding and returned demonstration   HOME EXERCISE PROGRAM: Coordination activities for ~20 min, 1x/day (printed handout administered)  MedBridge Code: [id number] - Seated Shoulder Shrugs  - 2 sets - 15 reps - Seated Scapular Retraction  - 2 sets - 15 reps - Supine Shoulder Press AAROM in Abduction with Dowel  - 2 sets - 10 reps - Putty Squeezes  - 1 sets - 20 reps     GOALS: Goals reviewed with patient? Yes   SHORT TERM GOALS: Target date: 02/04/2022   STG   Status:  1 Pt will demonstrate independence w/ visual compensatory  strategies On-going  2 Pt will verbalize understanding of fall prevention considerations Baseline: Decreased knowledge of fall prevention On-going    LONG TERM GOALS: Target date: 02/25/2022   LTG   Status:  1 Pt will demonstrate independence w/ HEP designed for LUE GMC, strength, and coordination Baseline: No HEP at this time On-going  2 Pt will demonstrate at least 120 degrees of L shoulder flexion within tolerable level of discomfort to improve participation in IADL tasks Baseline: 114 degrees On-going  3 Pt will improve unilateral GMC of LUE as evidenced by increasing Box and Blocks score by at least 2 blocks Baseline: 18 blocks w/ LUE; 24 blocks w/ RUE On-going  4 Pt will be able to complete full LB dressing w/ Mod I, incorporating AE prn, by d/c Baseline: Min A On-going  5 Pt to improve UEFI score by d/c to indicate overall improvement w/  functional use of LUE during bilateral activities Baseline: to be assessed On-going       ASSESSMENT:   CLINICAL IMPRESSION: OT completed informal reassessment of L shoulder due to persistence of pain w/ minimal improvement. Pt demonstrated most pain w/ abduction, unable to achieve active movement while seated, w/ minimal pain during mid-range shoulder flex/ext. Pt was able to tolerate more planes of movement and ROM while in supine position, likely due to increased scapular stability and support; pt able to tolerate mid-range abduction w/ UE supported in supine, but unable to replicate w/out UE support. Pt also did not report pain when attempting light deltoid isometrics in supine. OT reviewed importance of scapular stability and used a mirror as a visual aid due to poor alignment while sitting; w/out back support pt demonstrated notable R trunk flexion, head tilt toward R side, and elevated L scapula compared to R side, but pt was able to self-correct w/ visual and verbal cues.   PERFORMANCE DEFICITS in functional skills including ADLs, IADLs, coordination, dexterity, sensation, strength, pain, FMC, GMC, mobility, balance, body mechanics, endurance, decreased knowledge of use of DME, and UE functional use, cognitive skills including attention, memory, problem solving, and safety awareness, and psychosocial skills including environmental adaptation.    IMPAIRMENTS are limiting patient from ADLs, IADLs, rest and sleep, leisure, and social participation.    COMORBIDITIES may have co-morbidities  that affects occupational performance. Patient will benefit from skilled OT to address above impairments and improve overall function.   MODIFICATION OR ASSISTANCE TO COMPLETE EVALUATION: Min-Moderate modification of tasks or assist with assess necessary to complete an evaluation.   OT OCCUPATIONAL PROFILE AND HISTORY: Detailed assessment: Review of records and additional review of physical, cognitive,  psychosocial history related to current functional performance.   CLINICAL DECISION MAKING: Moderate - several treatment options, min-mod task modification necessary   REHAB POTENTIAL: Good   EVALUATION COMPLEXITY: Moderate     PLAN: OT FREQUENCY: 2x/week   OT DURATION: 6 weeks   PLANNED INTERVENTIONS: self care/ADL training, therapeutic exercise, therapeutic activity, neuromuscular re-education, manual therapy, passive range of motion, functional mobility training, aquatic therapy, moist heat, cryotherapy, patient/family education, cognitive remediation/compensation, energy conservation, and DME and/or AE instructions   RECOMMENDED OTHER SERVICES: Currently receiving PT services at this location; may benefit from referral to OP SLP   CONSULTED AND AGREED WITH PLAN OF CARE: Patient and family member/caregiver   PLAN FOR NEXT SESSION: continue to address L shoulder pain; [place] activities for LUE (weight bearing, targeting); fall prevention and visual compensatory strategies   Kathrine Cords, MSOT, OTR/L 02/06/2022, 2:22 PM

## 2022-02-07 ENCOUNTER — Inpatient Hospital Stay: Payer: BC Managed Care – PPO

## 2022-02-07 ENCOUNTER — Other Ambulatory Visit: Payer: Self-pay | Admitting: Hematology and Oncology

## 2022-02-07 ENCOUNTER — Other Ambulatory Visit: Payer: Self-pay

## 2022-02-07 ENCOUNTER — Inpatient Hospital Stay (HOSPITAL_BASED_OUTPATIENT_CLINIC_OR_DEPARTMENT_OTHER): Payer: BC Managed Care – PPO | Admitting: Physician Assistant

## 2022-02-07 VITALS — BP 133/101 | HR 106 | Temp 98.7°F | Wt 159.8 lb

## 2022-02-07 VITALS — BP 128/78 | HR 99 | Temp 98.2°F

## 2022-02-07 DIAGNOSIS — Z7901 Long term (current) use of anticoagulants: Secondary | ICD-10-CM | POA: Diagnosis not present

## 2022-02-07 DIAGNOSIS — C3490 Malignant neoplasm of unspecified part of unspecified bronchus or lung: Secondary | ICD-10-CM

## 2022-02-07 DIAGNOSIS — E876 Hypokalemia: Secondary | ICD-10-CM

## 2022-02-07 DIAGNOSIS — C7951 Secondary malignant neoplasm of bone: Secondary | ICD-10-CM

## 2022-02-07 DIAGNOSIS — C7931 Secondary malignant neoplasm of brain: Secondary | ICD-10-CM | POA: Diagnosis not present

## 2022-02-07 DIAGNOSIS — Z86718 Personal history of other venous thrombosis and embolism: Secondary | ICD-10-CM | POA: Diagnosis not present

## 2022-02-07 DIAGNOSIS — R42 Dizziness and giddiness: Secondary | ICD-10-CM | POA: Diagnosis not present

## 2022-02-07 DIAGNOSIS — R52 Pain, unspecified: Secondary | ICD-10-CM | POA: Diagnosis not present

## 2022-02-07 DIAGNOSIS — R0602 Shortness of breath: Secondary | ICD-10-CM | POA: Diagnosis not present

## 2022-02-07 DIAGNOSIS — Z4800 Encounter for change or removal of nonsurgical wound dressing: Secondary | ICD-10-CM | POA: Diagnosis not present

## 2022-02-07 DIAGNOSIS — C3431 Malignant neoplasm of lower lobe, right bronchus or lung: Secondary | ICD-10-CM | POA: Diagnosis not present

## 2022-02-07 DIAGNOSIS — Z5112 Encounter for antineoplastic immunotherapy: Secondary | ICD-10-CM | POA: Diagnosis not present

## 2022-02-07 DIAGNOSIS — Z87891 Personal history of nicotine dependence: Secondary | ICD-10-CM | POA: Diagnosis not present

## 2022-02-07 DIAGNOSIS — R110 Nausea: Secondary | ICD-10-CM | POA: Diagnosis not present

## 2022-02-07 DIAGNOSIS — E032 Hypothyroidism due to medicaments and other exogenous substances: Secondary | ICD-10-CM

## 2022-02-07 DIAGNOSIS — I10 Essential (primary) hypertension: Secondary | ICD-10-CM | POA: Diagnosis not present

## 2022-02-07 DIAGNOSIS — R918 Other nonspecific abnormal finding of lung field: Secondary | ICD-10-CM

## 2022-02-07 DIAGNOSIS — Z79899 Other long term (current) drug therapy: Secondary | ICD-10-CM | POA: Diagnosis not present

## 2022-02-07 DIAGNOSIS — Z5111 Encounter for antineoplastic chemotherapy: Secondary | ICD-10-CM | POA: Diagnosis not present

## 2022-02-07 LAB — CMP (CANCER CENTER ONLY)
ALT: 13 U/L (ref 0–44)
AST: 21 U/L (ref 15–41)
Albumin: 3.5 g/dL (ref 3.5–5.0)
Alkaline Phosphatase: 58 U/L (ref 38–126)
Anion gap: 9 (ref 5–15)
BUN: 12 mg/dL (ref 8–23)
CO2: 30 mmol/L (ref 22–32)
Calcium: 9.7 mg/dL (ref 8.9–10.3)
Chloride: 100 mmol/L (ref 98–111)
Creatinine: 0.65 mg/dL (ref 0.44–1.00)
GFR, Estimated: >60 mL/min (ref >60)
Glucose, Bld: 116 mg/dL — ABNORMAL HIGH (ref 70–99)
Potassium: 3.1 mmol/L — ABNORMAL LOW (ref 3.5–5.1)
Sodium: 139 mmol/L (ref 135–145)
Total Bilirubin: 0.3 mg/dL (ref 0.3–1.2)
Total Protein: 7 g/dL (ref 6.5–8.1)

## 2022-02-07 LAB — CBC WITH DIFFERENTIAL (CANCER CENTER ONLY)
Abs Immature Granulocytes: 0.01 10*3/uL (ref 0.00–0.07)
Basophils Absolute: 0.1 10*3/uL (ref 0.0–0.1)
Basophils Relative: 1 %
Eosinophils Absolute: 0.2 10*3/uL (ref 0.0–0.5)
Eosinophils Relative: 3 %
HCT: 36.7 % (ref 36.0–46.0)
Hemoglobin: 12.6 g/dL (ref 12.0–15.0)
Immature Granulocytes: 0 %
Lymphocytes Relative: 28 %
Lymphs Abs: 1.7 10*3/uL (ref 0.7–4.0)
MCH: 33.2 pg (ref 26.0–34.0)
MCHC: 34.3 g/dL (ref 30.0–36.0)
MCV: 96.8 fL (ref 80.0–100.0)
Monocytes Absolute: 1 10*3/uL (ref 0.1–1.0)
Monocytes Relative: 16 %
Neutro Abs: 3.2 10*3/uL (ref 1.7–7.7)
Neutrophils Relative %: 52 %
Platelet Count: 447 10*3/uL — ABNORMAL HIGH (ref 150–400)
RBC: 3.79 MIL/uL — ABNORMAL LOW (ref 3.87–5.11)
RDW: 13.7 % (ref 11.5–15.5)
WBC Count: 6.1 10*3/uL (ref 4.0–10.5)
nRBC: 0 % (ref 0.0–0.2)

## 2022-02-07 LAB — TSH: TSH: 1.822 u[IU]/mL (ref 0.350–4.500)

## 2022-02-07 MED ORDER — PROCHLORPERAZINE MALEATE 10 MG PO TABS
10.0000 mg | ORAL_TABLET | Freq: Once | ORAL | Status: AC
  Administered 2022-02-07: 10 mg via ORAL
  Filled 2022-02-07: qty 1

## 2022-02-07 MED ORDER — SODIUM CHLORIDE 0.9 % IV SOLN
200.0000 mg | Freq: Once | INTRAVENOUS | Status: AC
  Administered 2022-02-07: 200 mg via INTRAVENOUS
  Filled 2022-02-07: qty 200

## 2022-02-07 MED ORDER — CYANOCOBALAMIN 1000 MCG/ML IJ SOLN
1000.0000 ug | Freq: Once | INTRAMUSCULAR | Status: AC
  Administered 2022-02-07: 1000 ug via INTRAMUSCULAR
  Filled 2022-02-07: qty 1

## 2022-02-07 MED ORDER — POTASSIUM CHLORIDE 20 MEQ PO PACK: PACK | ORAL | 3 refills | Status: DC

## 2022-02-07 MED ORDER — SODIUM CHLORIDE 0.9 % IV SOLN
500.0000 mg/m2 | Freq: Once | INTRAVENOUS | Status: AC
  Administered 2022-02-07: 900 mg via INTRAVENOUS
  Filled 2022-02-07: qty 16

## 2022-02-07 MED ORDER — SODIUM CHLORIDE 0.9 % IV SOLN: Freq: Once | INTRAVENOUS | Status: AC

## 2022-02-07 MED ORDER — POTASSIUM CHLORIDE CRYS ER 20 MEQ PO TBCR
40.0000 meq | EXTENDED_RELEASE_TABLET | Freq: Once | ORAL | Status: AC
  Administered 2022-02-07: 40 meq via ORAL
  Filled 2022-02-07: qty 2

## 2022-02-07 NOTE — Patient Instructions (Signed)
[facility]   Discharge Instructions: Thank you for choosing [facility] to provide your oncology and hematology care.   If you have a lab appointment with the [facility], please go directly to the [facility] and check in at the registration area.   Wear comfortable clothing and clothing appropriate for easy access to any Portacath or PICC line.   We strive to give you quality time with your provider. You may need to reschedule your appointment if you arrive late (15 or more minutes).  Arriving late affects you and other patients whose appointments are after yours.  Also, if you miss three or more appointments without notifying the office, you may be dismissed from the clinic at the provider's discretion.      For prescription refill requests, have your pharmacy contact our office and allow 72 hours for refills to be completed.    Today you received the following chemotherapy and/or immunotherapy agents: pembrolizumab and pemetrexed      To help prevent nausea and vomiting after your treatment, we encourage you to take your nausea medication as directed.  BELOW ARE SYMPTOMS THAT SHOULD BE REPORTED IMMEDIATELY: *FEVER GREATER THAN 100.4 F (38 C) OR HIGHER *CHILLS OR SWEATING *NAUSEA AND VOMITING THAT IS NOT CONTROLLED WITH YOUR NAUSEA MEDICATION *UNUSUAL SHORTNESS OF BREATH *UNUSUAL BRUISING OR BLEEDING *URINARY PROBLEMS (pain or burning when urinating, or frequent urination) *BOWEL PROBLEMS (unusual diarrhea, constipation, pain near the anus) TENDERNESS IN MOUTH AND THROAT WITH OR WITHOUT PRESENCE OF ULCERS (sore throat, sores in mouth, or a toothache) UNUSUAL RASH, SWELLING OR PAIN  UNUSUAL VAGINAL DISCHARGE OR ITCHING   Items with * indicate a potential emergency and should be followed up as soon as possible or go to the Emergency Department if any problems should occur.  Please show the CHEMOTHERAPY ALERT CARD or IMMUNOTHERAPY ALERT  CARD at check-in to the Emergency Department and triage nurse.  Should you have questions after your visit or need to cancel or reschedule your appointment, please contact [facility]  Dept: [phone]  and follow the prompts.  Office hours are 8:00 a.m. to 4:30 p.m. Monday - Friday. Please note that voicemails left after 4:00 p.m. may not be returned until the following business day.  We are closed weekends and major holidays. You have access to a nurse at all times for urgent questions. Please call the main number to the clinic Dept: [phone] and follow the prompts.   For any non-urgent questions, you may also contact your provider using MyChart. We now offer e-Visits for anyone 74 and older to request care online for non-urgent symptoms. For details visit [email or web address].   Also download the MyChart app! Go to the app store, search "MyChart", open the app, select [facility], and log in with your MyChart username and password.  Due to Covid, a mask is required upon entering the hospital/clinic. If you do not have a mask, one will be given to you upon arrival. For doctor visits, patients may have 1 support person aged 28 or older with them. For treatment visits, patients cannot have anyone with them due to current Covid guidelines and our immunocompromised population.

## 2022-02-07 NOTE — Progress Notes (Signed)
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 CT of the head showed showed concerning for metastatic disease involving the left cerebellum and right occipital lobe.  2) 09/30/2019: MRI brain confirms mass within the left cerebellum measures 1.6 x 1.3 x 1.5 cm as well as at least 8 metastatic lesions within the supratentorial and infratentorial brain as outlined 3) 10/01/2019: PET CT scan revealed hypermetabolic infrahilar right lower lobe mass with hypermetabolic nodal metastases in the right hilum, mediastinum, right supraclavicular region, left axilla and lower left neck. 4) 10/08/2019: US guided biopsy of the lymph nodes confirms poorly differentiated non-small cell carcinoma. Immunohistochemistry confirms adenocarcinoma of lung primary. PD-L1 and NGS later revealed a MET Exon 14 mutation and TPS of 90%.  5) 10/12/2019: Establish care with Dr. Lorenso Courier  6) 11/04/2019: Day 1 of capmatinib 427m BID 7) 11/09/2019: presented to Rad/Onc with a DVT in LLE. Seen in symptom management clinic and started on apixaban.  8) 12/29/2019: CT C/A/P showed interval decrease in size of mass involving the superior segment of right lower lobe and reduction in mediastinal and left supraclavicular adenopathy though some small spinal lesions were noted in the interim between the two sets of scans 9) 01/25/2020: temporarily stopped capmatinib due to symptoms of nausea/fatigue. Restarted therapy on 01/30/2020 after brief chemo holiday.  10)  03/22/2020: CT C/A/P reveals a mixed picture, with new lung nodule and increased lymphadenopathy, however response in bone lesions. D/c capmatinib therapy 11) 04/08/2020: start of Carbo/Pem/Pem for 2nd line treatment. Cycle 1 Day 1   12) 04/29/2020: Cycle 2 Day 1 Carbo/Pem/Pem  13) 05/20/2020: Cycle 3 Day 1 Carbo/Pem/Pem  14) 06/09/2020: Cycle 4 Day 1 Carbo/Pem/Pem  15) 06/29/2020: Cycle 5 Day 1 maintenance Pem/Pem  16) 07/20/2020: Cycle 6 Day 1 maintenance Pem/Pem  17) 08/12/2020: Cycle 7 Day 1 maintenance Pem/Pem  18) 09/01/2020: Cycle 8 Day 1 maintenance Pem/Pem  19) 09/21/2020: Cycle 9 Day 1 maintenance Pem/Pem  20) 10/13/2020: Cycle 10 Day 1 maintenance Pem/Pem  21) 11/03/2020: Cycle 11 Day 1 maintenance Pem/Pem  22) 11/25/2020:  Cycle 12 Day 1 maintenance Pem/Pem 23) 12/15/2020:  Cycle 13 Day 1 maintenance Pem/Pem 24) 01/05/2021:  Cycle 14 Day 1 maintenance Pem/Pem 25) 01/26/2021:  Cycle 15 Day 1 maintenance Pem/Pem plus Bevacizumab.  02/17/2021: Cycle 16 Day 1 maintenance Pem/Pem plus Bevacizumab.  03/10/2021: Cycle 17 Day 1 maintenance Pem/Pem plus Bevacizumab.  03/31/2021: Cycle 18 Day 1 maintenance Pem/Pem  04/20/2021:  Cycle 19 Day 1 maintenance Pem/Pem  05/12/2021: Cycle 20 Day 1 maintenance Pem/Pem 06/01/2021: Cycle 21 Day 1 maintenance Pem/Pem 06/22/2021: Cycle 22 Day 1 maintenance Pem/Pem 07/13/2021: Cycle 23 Day 1 maintenance Pem/Pem 08/16/2021: Cycle 24 Day 1 maintenance Pem/Pem 09/08/2021: Cycle 25 Day 1 maintenance Pem/Pem plus Bevacizumab 09/28/2021: Cycle 26 Day 1 maintenance Pem/Pem plus Bevacizumab 10/20/2021: Cycle 27 Day 1 maintenance Pem/Pem plus Bevacizumab 11/09/2021: Cycle 28 Day 1 maintenance Pem/Pem  12/06/2021: Cycle 29 Day 1 maintenance Pem/Pem  12/27/2021: Cycle 30 Day 1 maintenance Pem/Pem  01/18/2022: Cycle 31 Day 1 maintenance Pem/Pem  02/07/2022: Cycle 32 Day  1 maintenance Pem/Pem    Interval History:  Virginia [name] 66 y.o. female with medical history significant for  metastatic adenocarcinoma of the lung presents for a follow up visit. The patient's last visit was on 01/18/2022.  In the interim since the last visit, she continues on maintenance Pem/Pem.   On exam today Virginia [name] is accompanied by her husband.  She reports persistent dizziness. She continues to go to physical therapy. Her appetite has improved. She has ocassional episodes of nausea that improves with prescribed antiemetics. She denies diarrhea or constipation. She denies easy bruising or signs of active bleeding. She continues to have shortness of breath with exertion. Her pain is currently under good control with her regimen of Xtampza and oxycodone.  She denies any fevers, chills, night sweats, chest pain, cough, rash or neuropathy. She has no other complaints. Rest of the 10 point ROS is below.  She is willing and able to proceed with treatment at this time.  MEDICAL HISTORY:  Past Medical History:  Diagnosis Date   Anemia    Anxiety    Concussion 09/28/2019   DVT (deep venous thrombosis) ([place]) 2021   L leg   Dyspnea    GERD (gastroesophageal reflux disease)    Hypercholesterolemia    per pt, she does not have elevated lipids   Hypertension    met lung ca dx'd 09/2019   mets to spine, hip and brain   PONV (postoperative nausea and vomiting)    Tobacco abuse     SURGICAL HISTORY: Past Surgical History:  Procedure Laterality Date   ABDOMINAL HYSTERECTOMY     partial/ left ovaries   APPLICATION OF CRANIAL NAVIGATION N/A 12/02/2020   Procedure: APPLICATION OF CRANIAL NAVIGATION;  Surgeon: Erline Levine, MD;  Location: [facility];  Service: Neurosurgery;  Laterality: N/A;   CHOLECYSTECTOMY     CRANIOTOMY N/A 12/02/2020   Procedure: Posterior fossa craniotomy for tumor resection with brainlab;  Surgeon: Erline Levine, MD;  Location: [facility];  Service: Neurosurgery;  Laterality: N/A;   DILATION AND CURETTAGE OF UTERUS     IR IMAGING GUIDED PORT INSERTION  10/23/2019   IR PATIENT EVAL TECH  0-60 MINS  11/21/2021   IR PATIENT EVAL TECH 0-60 MINS  11/24/2021   IR PATIENT EVAL TECH 0-60 MINS  11/29/2021   IR PATIENT EVAL TECH 0-60 MINS  12/04/2021   IR PATIENT EVAL TECH 0-60 MINS  12/12/2021   IR PATIENT EVAL TECH 0-60 MINS  12/19/2021   IR PATIENT EVAL TECH 0-60 MINS  12/27/2021   IR PATIENT EVAL TECH 0-60 MINS  01/03/2022   IR PATIENT EVAL TECH 0-60 MINS  01/10/2022   IR PATIENT EVAL TECH 0-60 MINS  01/18/2022   IR RADIOLOGIST EVAL & MGMT  01/23/2022   IR REMOVAL TUN ACCESS W/ PORT W/O FL MOD SED  11/17/2021   KYPHOPLASTY N/A 03/15/2020   Procedure: Thoracic Eight KYPHOPLASTY;  Surgeon: Erline Levine, MD;  Location: [facility];  Service: Neurosurgery;  Laterality: N/A;  prone    LIPOMA EXCISION  2018   removed under left breast and right thigh.   TUBAL LIGATION      ALLERGIES:  has No Known Allergies.  MEDICATIONS:  Current Outpatient Medications  Medication Sig Dispense Refill   acetaminophen (TYLENOL) 500 MG tablet Take 1,000 mg by mouth every 8 (eight) hours as needed for moderate pain.     albuterol (VENTOLIN HFA) 108 (90 Base) MCG/ACT inhaler INHALE 2 PUFFS INTO THE LUNGS EVERY  6 HOURS AS NEEDED FOR WHEEZING OR SHORTNESS OF BREATH 6.7 g 11   ALPRAZolam (XANAX) 0.25 MG tablet Take 1 tablet (0.25 mg total) by mouth 2 (two) times daily as needed for anxiety 60 tablet 1   amLODipine (NORVASC) 10 MG tablet Take 10 mg by mouth daily.     Cyanocobalamin (VITAMIN B12 PO) Take 1 tablet by mouth daily. Gummies     EPINEPHrine 0.3 mg/0.3 mL IJ SOAJ injection Inject 0.3 mg into the muscle as needed for anaphylaxis. 2 each 0   folic acid (FOLVITE) 1 MG tablet Take 1 tablet by mouth daily. 90 tablet 1   furosemide (LASIX) 20 MG tablet Take 2 tablets (40 mg total) by mouth 2 times daily. 360 tablet 0   lidocaine-prilocaine (EMLA) cream Apply to port as needed. 30 g 0   meclizine (ANTIVERT) 25 MG tablet Take 1 tablet (25 mg total) by mouth 3 (three) times daily as needed for dizziness. 30 tablet 0    Multiple Vitamins-Minerals (AIRBORNE PO) Take 1 tablet by mouth daily.      ofloxacin (OCUFLOX) 0.3 % ophthalmic solution 2 drops each eye 4 times a day for 5 days (Patient taking differently: Place 2 drops into both eyes daily as needed (irritation).) 5 mL 1   OLANZapine (ZYPREXA) 2.5 MG tablet TAKE 1 TABLET BY MOUTH AT BEDTIME 90 tablet 1   ondansetron (ZOFRAN) 4 MG tablet Take 1 tablet (4 mg total) by mouth every 8 (eight) hours as needed for nausea or vomiting. 40 tablet 2   oxyCODONE (OXY IR/ROXICODONE) 5 MG immediate release tablet TAKE 1 TO 2 TABLETS BY MOUTH EVERY 4 HOURS AS NEEDED FOR SEVERE PAIN 90 tablet 0   pantoprazole (PROTONIX) 40 MG tablet TAKE 1 TABLET BY MOUTH ONCE DAILY 90 tablet 3   polyethylene glycol (MIRALAX / GLYCOLAX) 17 g packet Take 17 g by mouth every evening.     potassium chloride (KLOR-CON) 20 MEQ packet Take 40 mEq in the morning and 20 mEq in the evening. 90 packet 3   Vitamin D3 (VITAMIN D) 25 MCG tablet Take 2,000 Units by mouth every evening.     vitamin E 180 MG (400 UNITS) capsule Take 400 IU daily x 1 week, then 400 IU BID (Patient taking differently: Take 400 Units by mouth daily.) 60 capsule 4   XTAMPZA ER 13.5 MG C12A Take 1 capsule by mouth in the morning and at bedtime. (every 12 hours) 60 capsule 0   No current facility-administered medications for this visit.    REVIEW OF SYSTEMS:   Constitutional: ( - ) fevers, ( - )  chills , ( - ) night sweats Eyes: ( - ) blurriness of vision, ( - ) double vision, ( - ) watery eyes Ears, nose, mouth, throat, and face: ( - ) mucositis, ( - ) sore throat Respiratory: ( - ) cough, ( + ) dyspnea, ( - ) wheezes Cardiovascular: ( - ) palpitation, ( - ) chest discomfort, ( - ) lower extremity swelling Gastrointestinal:  ( + ) nausea, ( - ) heartburn, ( - ) change in bowel habits Skin: ( - ) abnormal skin rashes Lymphatics: ( - ) new lymphadenopathy, ( - ) easy bruising Neurological: ( - ) numbness, ( - ) tingling, ( -  ) new weaknesses Behavioral/Psych: ( - ) mood change, ( - ) new changes  All other systems were reviewed with the patient and are negative.  PHYSICAL EXAMINATION: ECOG PERFORMANCE STATUS: 1 -  Symptomatic but completely ambulatory  Vitals:   02/07/22 1131  BP: (!) 133/101  Pulse: (!) 106  Temp: 98.7 F (37.1 C)  SpO2: 99%     Filed Weights   02/07/22 1131  Weight: 159 lb 12.8 oz (72.5 kg)   GENERAL: well appearing middle aged Caucasian female in NAD  SKIN: skin color, texture, turgor are normal, no rashes or significant lesions EYES: conjunctiva are pink and non-injected, sclera clear LUNGS: wheezing at lung bases. clear to auscultation and percussion with normal breathing effort HEART: regular rate & rhythm and no murmurs and bilateral trace pitting lower extremity edema Musculoskeletal: no cyanosis of digits and no clubbing PSYCH: alert & oriented x 3, fluent speech NEURO: no focal motor/sensory deficits  LABORATORY DATA:  I have reviewed the data as listed    Latest Ref Rng & Units 02/07/2022   11:46 AM 01/18/2022   10:48 AM 12/27/2021    9:46 AM  CBC  WBC 4.0 - 10.5 K/uL 6.1   5.7   5.6    Hemoglobin 12.0 - 15.0 g/dL 12.6   12.6   12.3    Hematocrit 36.0 - 46.0 % 36.7   37.1   36.9    Platelets 150 - 400 K/uL 447   319   486         Latest Ref Rng & Units 02/07/2022   11:08 AM 01/18/2022   10:48 AM 12/27/2021    9:46 AM  CMP  Glucose 70 - 99 mg/dL 116   123   120    BUN 8 - 23 mg/dL _0 Creatinine 0.44 - 1.00 mg/dL 0.65   0.72   0.79    Sodium 135 - 145 mmol/L 139   139   141    Potassium 3.5 - 5.1 mmol/L 3.1   3.4   3.4    Chloride 98 - 111 mmol/L 100   100   103    CO2 22 - 32 mmol/L 30   32   29    Calcium 8.9 - 10.3 mg/dL 9.7   9.3   9.2    Total Protein 6.5 - 8.1 g/dL 7.0   7.0   6.9    Total Bilirubin 0.3 - 1.2 mg/dL 0.3   0.3   0.4    Alkaline Phos 38 - 126 U/L 58   51   60    AST 15 - 41 U/L _1 ALT 0 - 44 U/L _2 RADIOGRAPHIC STUDIES: I personally have viewed the radiographic studies below: MR BRAIN W WO CONTRAST  Result Date: 01/26/2022 CLINICAL DATA:  Brain/CNS neoplasm, assess treatment response. EXAM: MRI HEAD WITHOUT AND WITH CONTRAST TECHNIQUE: Multiplanar, multiecho pulse sequences of the brain and surrounding structures were obtained without and with intravenous contrast. CONTRAST:  52m MAGNEVIST GADOPENTETATE DIMEGLUMINE 469.01 MG/ML IV SOLN COMPARISON:  10/27/2021 FINDINGS: Brain: Small area of DWI hyperintensity at the base of the left middle cerebellar peduncle has decreased in size. There are chronic blood products overlying the cerebellar hemispheres. Status post resection of left cerebellar lesion. Mild edema surrounding the resection site is unchanged. Unchanged appearance of right cerebellar lesions. Areas of stippled contrast enhancement within the bilateral cerebellar hemispheres and the vermis have worsened. Punctate right parietal lesion is unchanged (series 11, image 97). Small left frontal precentral  gyrus lesion has slightly increased in size to 3 mm (series 11, image 124). There is diffuse, smooth dural enhancement. Vascular: Major flow voids are preserved. Skull and upper cervical spine: Normal calvarium and skull base. Visualized upper cervical spine and soft tissues are normal. Sinuses/Orbits:No paranasal sinus fluid levels or advanced mucosal thickening. No mastoid or middle ear effusion. Normal orbits. IMPRESSION: 1. Worsened areas of stippled contrast enhancement within both cerebellar hemispheres and vermis, in addition to increased size of left precentral gyrus lesion, together concerning for disease progression. 2. Unchanged appearance of right parietal lesion. 3. Diffuse, smooth dural enhancement, which could indicate intracranial hypotension. Electronically Signed   By: Ulyses Jarred M.D.   On: 01/26/2022 21:39   IR PATIENT EVAL TECH 0-60 MINS  Result Date:  01/18/2022 Gavin Pound, RT     01/18/2022  3:17 PM Patient came in today for a [name] site check. Her dressing was removed and the site was cleaned.  Additional Hydrogel was install in the wound and the site was covered with a guaze- Tegaderm dressing.    IR PATIENT EVAL TECH 0-60 MINS  Result Date: 01/10/2022 Wolfgang Phoenix     01/10/2022  2:11 PM Patient came today for a Port site check. Please see Arletha Grippe note from today.  IR Radiologist Eval & Mgmt  Result Date: 01/24/2022 EXAM: ESTABLISHED PATIENT OFFICE VISIT CHIEF COMPLAINT: Infected port size status post removal 11/17/2021 Current Pain Level: 0 HISTORY OF PRESENT ILLNESS: Patient with history of infected port site status post removal on 11/17/2021. Port originally placed 10/23/2019. Wound care/site checks performed weekly since removal. Today-port site check, patient reports mild tenderness with palpation. Site was flushed with normal saline gently dry with gauze. Old Hydrogel was removed and cleaned. Pocket is clean/pink with no signs of infection. Wound culture was sent to see if any infection is present. Pocket was filled with Hydrogel and covered with Telfa and paper tape. We will notify patient of wound culture results. Per Dr. Serafina Royals if wound culture is negative, patient may be candidate for scar revision to close wound so it can heal. REVIEW OF SYSTEMS: Not applicable PHYSICAL EXAMINATION: Not applicable ASSESSMENT AND PLAN: We will notify patient of wound culture results and plan for future care of port site. Read by: Narda Rutherford, AGNP-BC Electronically Signed   By: [name] M.D.   On: 01/24/2022 07:44   SLEEP STUDY DOCUMENTS  Result Date: 01/25/2022 Ordered by an unspecified provider.    ASSESSMENT & PLAN ENCARNACION BOLE 66 y.o. female with medical history significant for metastatic adenocarcinoma of the lung presents for a follow up visit. [facility] testing has shown she has a MET Exon 14 mutation and TPS of  90%.   Previously we discussed Carboplatin/Pembrolizumab/Pemetrexed and the expected side effect from these medications.  We discussed the possible side effects of immunotherapy including colitis, hepatitis, dermatitis, and pneumonitis.  Additionally we discussed the side effects of carboplatin which include suppression of blood counts, nephrotoxicity, and nausea.  Additionally we discussed pemetrexed which can also have effects on counts and would require supplementation with vitamin T88 and folic acid.  She voiced her understanding of these side effects and treatment plans moving forward.  I also noted that we would be able to drop the chemotherapy agents that she had intolerance to continue with pembrolizumab alone given her high TPS score.  The patient has a markedly elevated TPS score (90%)   # Metastatic Adenocarcinoma of the Lung with MET  Exon 14 Mutation --Current treatment includes Pem/Pem maintenance (Carboplatin dropped after Cycle 4)  --Bevacizumab was added from 01/26/2021-03/10/2021. Due to worsening dizziness, she resumed this from Cycle 25-27.  --repeat CT CAP on 12/11/2021 showed stable disease with no evidence of progression. Next due in July 2023.  PLAN: --Labs from today were reviewed and adequate for treatment. --Continue with Cycle 32, Day 1 of maintenance Pem/Pem. --RTC q 3 weeks for labs, follow up visit with Dr. Lorenso Courier prior to Cycle 33.    #Nausea/Vomiting, improving -- provided patient with Zofran 4-18m q8h PRN and compazine [id number] for breakthrough PRN.  She is currently using Phenergan for breakthrough. --continue olanzpaine 2.5 mg PO QHS to help with nausea. Marked improvement since the addition of this medication on 01/29/2020.  --continue to monitor   #Hypokalemia, stable --likely 2/2 to decreased PO intake, hypovolemia, and BP medications --Potassium level 3.1 today.  Gave 40 mEq of oral potassium chloride with treatment today.  --Currently on potassium chloride  tablets, 40 mEq PO potassium in AM and 2041min PM. Due to difficulty swallowing tablets, we will try granules. Sent new prescription today.   #Pain Control, stable --continue oxycodone 5mg68mH PRN. Can increase dose to 10mg45m if pain is severe. She is taking approximately 2-3 pills per day.  --continue Xtampza ER BID for long acting support.  --continue to take with Senokot to prevent opioid induced constipation. 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 Last 01/18/2022, next due in August 2023.  --nausea as above    #Brain Metastasis, stable --appreciate the assistance of Dr. VasloMickeal Skinnerreating her brain metastasis and symptoms. --radiation therapy to the brain completed on 11/16/2019.  --MRI brain on 04/29/2020 showed evidence of small brain bleed. Eliquis therapy was discontinued.  --MRI on 11/04/2020 showed concern for progression of intracranial disease.  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  --MRI on 12/23/2020 showed evidence of radiation necrosis. Dr. VasloMickeal Skinneruated the patient on 01/02/2021 with recommendations to add IV Bevacizumab 10 mg/kg q 3 weeks ( started 01/26/2021). Completed a full course of this therapy. --05/26/2021: MRI brain showed no substantial changes.  --Patient was evaluated by Dr. VasloMickeal Skinner2/20/2022 and recommended to resume Bevacizumab due to worsening dizziness.  Which was given from Cycle 25-27.   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    # Hypertension: --BP at today's visit was 133/101. Continue Norvasc to 10 mg daily.  --continue to monitor at home and in clinic.    Orders Placed This Encounter  Procedures   CBC  with Differential ([place])    Standing Status:   Future    Number of Occurrences:   1    Standing Expiration Date:   02/08/2023   CMP ([place])    Standing Status:   Future    Number of Occurrences:   1    Standing Expiration Date:   02/08/2023    All questions were answered. The patient knows to call the clinic with any problems, questions or concerns.  I have spent a total of 30 minutes minutes of face-to-face and non-face-to-face time, preparing to see the patient, performing a medically appropriate examination, counseling and educating the patient,  documenting clinical information in the electronic health record, and care coordination.   [name] [name] Dept of Hematology and [place] at [facility] Phone: [phone]   02/07/2022 9:59 PM   Literature Support:  Lorenza Chick, Clearence Cheek, Duffy Rhody, Felip E, Bylas F, Domine M, [place], Hochmair MJ, [place], [place], Bischoff HG, [place], Grossi F, [place], Reck M, [place], [place], [place], Rubio-Viqueira B, Novello S, Kurata T, Gray JE, Vida J, Wei Z, Yang J, Raftopoulos H, [place], [place] [employer]; [id number] Investigators. Pembrolizumab plus Chemotherapy in Metastatic Non-Small-Cell Lung Cancer. [place] Med. 2018 May 31;378(22):2078-2092.  --Median progression-free survival was 8.8 months (95% CI, 7.6 to 9.2) in the pembrolizumab-combination group and 4.9 months (95% CI, 4.7 to 5.5) in the placebo-combination group (hazard ratio for disease progression or death, 0.52; 95% CI, 0.43 to 0.64; P<0.001). Adverse events of grade 3 or higher occurred in 67.2% of the patients in the pembrolizumab-combination group and in 65.8% of those in the placebo-combination group.  Jodene Nam L. Efficacy of pemetrexed-based regimens in advanced non-small cell lung cancer patients with activating epidermal growth factor receptor mutations after  tyrosine kinase inhibitor failure: a systematic review. Onco Targets Ther. 2018;11:2121-2129.   --The weighted median PFS, median OS, and ORR for patients treated with pem regimens were 5.09 months, 15.91 months, and 30.19%, respectively. Our systematic review results showed a favorable efficacy profile of pem regimens in NSCLC patients with EGFR mutation after EGFR-TKI failure.

## 2022-02-08 ENCOUNTER — Ambulatory Visit: Payer: BC Managed Care – PPO | Admitting: Physical Therapy

## 2022-02-08 ENCOUNTER — Ambulatory Visit: Payer: BC Managed Care – PPO | Admitting: Occupational Therapy

## 2022-02-12 ENCOUNTER — Other Ambulatory Visit: Payer: Self-pay | Admitting: Student

## 2022-02-12 ENCOUNTER — Other Ambulatory Visit: Payer: Self-pay | Admitting: Radiology

## 2022-02-12 DIAGNOSIS — C3491 Malignant neoplasm of unspecified part of right bronchus or lung: Secondary | ICD-10-CM

## 2022-02-13 ENCOUNTER — Other Ambulatory Visit (HOSPITAL_COMMUNITY): Payer: Self-pay | Admitting: Interventional Radiology

## 2022-02-13 ENCOUNTER — Ambulatory Visit (HOSPITAL_COMMUNITY)
Admission: RE | Admit: 2022-02-13 | Discharge: 2022-02-13 | Disposition: A | Discharge status: Home / Self Care | Payer: BC Managed Care – PPO | Source: Ambulatory Visit | Attending: Interventional Radiology | Admitting: Interventional Radiology

## 2022-02-13 ENCOUNTER — Ambulatory Visit: Payer: BC Managed Care – PPO | Admitting: Physical Therapy

## 2022-02-13 ENCOUNTER — Encounter (HOSPITAL_COMMUNITY): Payer: Self-pay | Admitting: Radiology

## 2022-02-13 ENCOUNTER — Other Ambulatory Visit: Payer: Self-pay

## 2022-02-13 ENCOUNTER — Ambulatory Visit: Payer: BC Managed Care – PPO | Admitting: Occupational Therapy

## 2022-02-13 DIAGNOSIS — C3490 Malignant neoplasm of unspecified part of unspecified bronchus or lung: Secondary | ICD-10-CM | POA: Diagnosis not present

## 2022-02-13 DIAGNOSIS — T8189XA Other complications of procedures, not elsewhere classified, initial encounter: Secondary | ICD-10-CM | POA: Insufficient documentation

## 2022-02-13 DIAGNOSIS — Y848 Other medical procedures as the cause of abnormal reaction of the patient, or of later complication, without mention of misadventure at the time of the procedure: Secondary | ICD-10-CM | POA: Diagnosis not present

## 2022-02-13 DIAGNOSIS — Z95828 Presence of other vascular implants and grafts: Secondary | ICD-10-CM

## 2022-02-13 DIAGNOSIS — Z9221 Personal history of antineoplastic chemotherapy: Secondary | ICD-10-CM | POA: Diagnosis not present

## 2022-02-13 DIAGNOSIS — Z87891 Personal history of nicotine dependence: Secondary | ICD-10-CM | POA: Insufficient documentation

## 2022-02-13 DIAGNOSIS — C3491 Malignant neoplasm of unspecified part of right bronchus or lung: Secondary | ICD-10-CM

## 2022-02-13 HISTORY — PX: IR PATIENT EVAL TECH 0-60 MINS: IMG5564

## 2022-02-13 LAB — CBC WITH DIFFERENTIAL/PLATELET
Abs Immature Granulocytes: 0.02 10*3/uL (ref 0.00–0.07)
Basophils Absolute: 0 10*3/uL (ref 0.0–0.1)
Basophils Relative: 1 %
Eosinophils Absolute: 0.1 10*3/uL (ref 0.0–0.5)
Eosinophils Relative: 2 %
HCT: 37.2 % (ref 36.0–46.0)
Hemoglobin: 13.1 g/dL (ref 12.0–15.0)
Immature Granulocytes: 0 %
Lymphocytes Relative: 24 %
Lymphs Abs: 1.1 10*3/uL (ref 0.7–4.0)
MCH: 34 pg (ref 26.0–34.0)
MCHC: 35.2 g/dL (ref 30.0–36.0)
MCV: 96.6 fL (ref 80.0–100.0)
Monocytes Absolute: 0.5 10*3/uL (ref 0.1–1.0)
Monocytes Relative: 11 %
Neutro Abs: 3.1 10*3/uL (ref 1.7–7.7)
Neutrophils Relative %: 62 %
Platelets: 216 10*3/uL (ref 150–400)
RBC: 3.85 MIL/uL — ABNORMAL LOW (ref 3.87–5.11)
RDW: 13 % (ref 11.5–15.5)
WBC: 4.9 10*3/uL (ref 4.0–10.5)
nRBC: 0 % (ref 0.0–0.2)

## 2022-02-13 MED ORDER — SODIUM CHLORIDE 0.9 % IV SOLN: INTRAVENOUS | Status: DC

## 2022-02-13 MED ORDER — FENTANYL CITRATE (PF) 100 MCG/2ML IJ SOLN
INTRAMUSCULAR | Status: AC
  Filled 2022-02-13: qty 4

## 2022-02-13 MED ORDER — MIDAZOLAM HCL 2 MG/2ML IJ SOLN
INTRAMUSCULAR | Status: AC | PRN
  Administered 2022-02-13: 1 mg via INTRAVENOUS
  Administered 2022-02-13: .5 mg via INTRAVENOUS

## 2022-02-13 MED ORDER — FENTANYL CITRATE (PF) 100 MCG/2ML IJ SOLN
INTRAMUSCULAR | Status: AC | PRN
  Administered 2022-02-13: 50 ug via INTRAVENOUS
  Administered 2022-02-13: 25 ug via INTRAVENOUS

## 2022-02-13 MED ORDER — CHLORHEXIDINE GLUCONATE 4 % EX LIQD
CUTANEOUS | Status: AC
  Filled 2022-02-13: qty 15

## 2022-02-13 MED ORDER — MIDAZOLAM HCL 2 MG/2ML IJ SOLN
INTRAMUSCULAR | Status: AC
  Filled 2022-02-13: qty 4

## 2022-02-13 NOTE — Procedures (Signed)
Patient was brought into Interventional Radiology for wound closure of after Port-a-Cath removal. See Dr. Malachi Carl notes for full procedure dictation

## 2022-02-13 NOTE — Procedures (Signed)
Interventional Radiology Procedure Note  Procedure: Port pocket scar revision and closure  Findings: Please refer to procedural dictation for full description.  Complications: None immediate  Estimated Blood Loss: < 5 ml  Recommendations: Routine post port wound care. Follow up with IR as needed.   [name], MD Pager: [phone]

## 2022-02-13 NOTE — Consult Note (Signed)
Chief Complaint: Patient was seen in consultation today for nonhealing wound of port-a-cath site  Supervising Physician: [name]  Patient Status: [name] - Out-pt  History of Present Illness: Virginia [name] STAMP is a 66 y.o. female oncology patient with metastatic lung cancer undergoing chemotherapy.  She had a right sided port placed that was subsequently removed secondary to presumed infection.  Since removal on 11/17/21, she has undergone multiple wound site packings and exchanges initially with iodoform gauze followed by hydrogel foam on subsequent site checks.  The pocket has neither healed nor closed.   Wound culture results were negative and she presents today for scar revision and wound closure.  Past Medical History:  Diagnosis Date   Anemia    Anxiety    Concussion 09/28/2019   DVT (deep venous thrombosis) ([place]) 2021   L leg   Dyspnea    GERD (gastroesophageal reflux disease)    Hypercholesterolemia    per pt, she does not have elevated lipids   Hypertension    met lung ca dx'd 09/2019   mets to spine, hip and brain   PONV (postoperative nausea and vomiting)    Tobacco abuse     Past Surgical History:  Procedure Laterality Date   ABDOMINAL HYSTERECTOMY     partial/ left ovaries   APPLICATION OF CRANIAL NAVIGATION N/A 12/02/2020   Procedure: APPLICATION OF CRANIAL NAVIGATION;  Surgeon: Erline Levine, MD;  Location: [place];  Service: Neurosurgery;  Laterality: N/A;   CHOLECYSTECTOMY     CRANIOTOMY N/A 12/02/2020   Procedure: Posterior fossa craniotomy for tumor resection with brainlab;  Surgeon: Erline Levine, MD;  Location: [place];  Service: Neurosurgery;  Laterality: N/A;   DILATION AND CURETTAGE OF UTERUS     IR IMAGING GUIDED PORT INSERTION  10/23/2019   IR PATIENT EVAL TECH 0-60 MINS  11/21/2021   IR PATIENT EVAL TECH 0-60 MINS  11/24/2021   IR PATIENT EVAL TECH 0-60 MINS  11/29/2021   IR PATIENT EVAL TECH 0-60 MINS  12/04/2021   IR PATIENT EVAL TECH 0-60 MINS   12/12/2021   IR PATIENT EVAL TECH 0-60 MINS  12/19/2021   IR PATIENT EVAL TECH 0-60 MINS  12/27/2021   IR PATIENT EVAL TECH 0-60 MINS  01/03/2022   IR PATIENT EVAL TECH 0-60 MINS  01/10/2022   IR PATIENT EVAL TECH 0-60 MINS  01/18/2022   IR RADIOLOGIST EVAL & MGMT  01/23/2022   IR REMOVAL TUN ACCESS W/ PORT W/O FL MOD SED  11/17/2021   KYPHOPLASTY N/A 03/15/2020   Procedure: Thoracic Eight KYPHOPLASTY;  Surgeon: Erline Levine, MD;  Location: [place];  Service: Neurosurgery;  Laterality: N/A;  prone    LIPOMA EXCISION  2018   removed under left breast and right thigh.   TUBAL LIGATION      Allergies: Patient has no known allergies.  Medications: Prior to Admission medications   Medication Sig Start Date End Date Taking? Authorizing Provider  acetaminophen (TYLENOL) 500 MG tablet Take 500 mg by mouth 2 (two) times daily as needed for moderate pain.   Yes [provider]  albuterol (VENTOLIN HFA) 108 (90 Base) MCG/ACT inhaler INHALE 2 PUFFS INTO THE LUNGS EVERY 6 HOURS AS NEEDED FOR WHEEZING OR SHORTNESS OF BREATH 11/30/21  Yes Baxley, Cresenciano Lick, MD  amLODipine (NORVASC) 10 MG tablet Take 10 mg by mouth daily. 01/20/22  Yes [provider]  Cyanocobalamin (VITAMIN B12 PO) Take 1 tablet by mouth daily. Gummies   Yes [provider]  EPINEPHrine 0.3 mg/0.3 mL IJ SOAJ injection Inject 0.3 mg into the muscle as needed for anaphylaxis. 11/17/21  Yes Kayleen Memos, DO  folic acid (FOLVITE) 1 MG tablet Take 1 tablet by mouth daily. 10/16/21 10/16/22 Yes Orson Slick, MD  furosemide (LASIX) 20 MG tablet Take 2 tablets (40 mg total) by mouth 2 times daily. 12/21/21  Yes Orson Slick, MD  Glucosamine HCl (GLUCOSAMINE PO) Take 2 tablets by mouth daily.   Yes [provider]  lidocaine-prilocaine (EMLA) cream Apply to port as needed. 02/17/21  Yes Orson Slick, MD  LYSINE PO Take 1 tablet by mouth daily.   Yes [provider]  Multiple Vitamin (MULTIVITAMIN WITH  MINERALS) TABS tablet Take 1 tablet by mouth daily.   Yes [provider]  Multiple Vitamins-Minerals (AIRBORNE PO) Take 1 tablet by mouth daily.    Yes [provider]  OLANZapine (ZYPREXA) 2.5 MG tablet TAKE 1 TABLET BY MOUTH AT BEDTIME 01/21/22  Yes Orson Slick, MD  ondansetron (ZOFRAN) 4 MG tablet Take 1 tablet (4 mg total) by mouth every 8 (eight) hours as needed for nausea or vomiting. 12/21/21  Yes Orson Slick, MD  OVER THE COUNTER MEDICATION Take 2 tablets by mouth daily. Beet root   Yes [provider]  oxyCODONE (OXY IR/ROXICODONE) 5 MG immediate release tablet TAKE 1 TO 2 TABLETS BY MOUTH EVERY 4 HOURS AS NEEDED FOR SEVERE PAIN Patient taking differently: Take 5 mg by mouth every 4 (four) hours as needed for severe pain. 01/23/22 07/22/22 Yes Orson Slick, MD  pantoprazole (PROTONIX) 40 MG tablet TAKE 1 TABLET BY MOUTH ONCE DAILY 09/17/21  Yes Baxley, Cresenciano Lick, MD  polyethylene glycol (MIRALAX / GLYCOLAX) 17 g packet Take 17 g by mouth every evening.   Yes [provider]  potassium chloride (KLOR-CON) 20 MEQ packet Take 40 mEq in the morning and 20 mEq in the evening. 02/07/22  Yes Thayil, Irene T, [name]  Tetrahydrozoline HCl (VISINE OP) Place 1 drop into both eyes daily as needed (irritation).   Yes [provider]  VITAMIN D PO Take 1 capsule by mouth daily.   Yes [provider]  vitamin E 180 MG (400 UNITS) capsule Take 400 IU daily x 1 week, then 400 IU BID Patient taking differently: Take 400 Units by mouth daily. 08/22/20  Yes [name], MD  XTAMPZA ER 13.5 MG C12A Take 1 capsule by mouth in the morning and at bedtime. (every 12 hours) 01/23/22 07/22/22 Yes Orson Slick, MD  ALPRAZolam Duanne Moron) 0.25 MG tablet Take 1 tablet (0.25 mg total) by mouth 2 (two) times daily as needed for anxiety 09/28/21   Orson Slick, MD  meclizine (ANTIVERT) 25 MG tablet Take 1 tablet (25 mg total) by mouth 3 (three) times daily  as needed for dizziness. Patient not taking: Reported on 02/09/2022 09/28/21   Ventura Sellers, MD     Family History  Problem Relation Age of Onset   Heart disease Father    Drug abuse Daughter    Drug abuse Son    Cancer Sister    Heart disease Brother    Heart attack Brother     Social History   Socioeconomic History   Marital status: Married    Spouse name: Not on file   Number of children: 3   Years of education: Not on file  Highest education level: Not on file  Occupational History   Occupation: owns printing co    Employer: [employer]  Tobacco Use   Smoking status: Former    Packs/day: 0.25    Types: Cigarettes    Quit date: 10/31/2012    Years since quitting: 9.2   Smokeless tobacco: Never  Vaping Use   Vaping Use: Never used  Substance and Sexual Activity   Alcohol use: Yes    Alcohol/week: 0.0 standard drinks    Comment: rare   Drug use: No   Sexual activity: Yes  Other Topics Concern   Not on file  Social History Narrative   Not on file   Social Determinants of Health   Financial Resource Strain: Not on file  Food Insecurity: Not on file  Transportation Needs: Not on file  Physical Activity: Not on file  Stress: Not on file  Social Connections: Not on file    Review of Systems: A 12 point ROS discussed and pertinent positives are indicated in the HPI above.  All other systems are negative.  Review of Systems  Constitutional: Negative.   HENT: Negative.    Eyes: Negative.   Respiratory: Negative.    Cardiovascular:  Positive for leg swelling.  Gastrointestinal: Negative.   Endocrine: Negative.   Genitourinary: Negative.   Musculoskeletal: Negative.   Allergic/Immunologic: Negative.   Neurological: Negative.   Hematological: Negative.   Psychiatric/Behavioral: Negative.     Vital Signs: BP (!) 128/93 (BP Location: Right Arm)   Pulse 89   Temp 98.4 F (36.9 C) (Oral)   Ht $R'5\' 2"'xV$  (1.575 m)   Wt 157 lb (71.2 kg)   SpO2 96%    BMI 28.72 kg/m   Physical Exam Constitutional:      General: She is not in acute distress. HENT:     Head: Normocephalic and atraumatic.     Mouth/Throat:     Mouth: Mucous membranes are moist.     Pharynx: Oropharynx is clear.  Cardiovascular:     Rate and Rhythm: Normal rate.     Pulses: Normal pulses.     Heart sounds: Normal heart sounds.  Pulmonary:     Effort: Pulmonary effort is normal. No respiratory distress.     Breath sounds: Normal breath sounds.  Abdominal:     General: Abdomen is flat.     Palpations: Abdomen is soft.  Musculoskeletal:     Cervical back: Neck supple.  Skin:    General: Skin is warm and dry.     Comments: Right chest wall wound with approximately 1 cm opening into port pocket.  No erythema, drainage, or evidence for infection.    Neurological:     General: No focal deficit present.     Mental Status: She is alert and oriented to person, place, and time.  Psychiatric:        Mood and Affect: Mood normal.        Behavior: Behavior normal.    Imaging: MR BRAIN W WO CONTRAST  Result Date: 01/26/2022 CLINICAL DATA:  Brain/CNS neoplasm, assess treatment response. EXAM: MRI HEAD WITHOUT AND WITH CONTRAST TECHNIQUE: Multiplanar, multiecho pulse sequences of the brain and surrounding structures were obtained without and with intravenous contrast. CONTRAST:  26mL MAGNEVIST GADOPENTETATE DIMEGLUMINE 469.01 MG/ML IV SOLN COMPARISON:  10/27/2021 FINDINGS: Brain: Small area of DWI hyperintensity at the base of the left middle cerebellar peduncle has decreased in size. There are chronic blood products overlying the cerebellar hemispheres. Status post resection  of left cerebellar lesion. Mild edema surrounding the resection site is unchanged. Unchanged appearance of right cerebellar lesions. Areas of stippled contrast enhancement within the bilateral cerebellar hemispheres and the vermis have worsened. Punctate right parietal lesion is unchanged (series 11, image  97). Small left frontal precentral gyrus lesion has slightly increased in size to 3 mm (series 11, image 124). There is diffuse, smooth dural enhancement. Vascular: Major flow voids are preserved. Skull and upper cervical spine: Normal calvarium and skull base. Visualized upper cervical spine and soft tissues are normal. Sinuses/Orbits:No paranasal sinus fluid levels or advanced mucosal thickening. No mastoid or middle ear effusion. Normal orbits. IMPRESSION: 1. Worsened areas of stippled contrast enhancement within both cerebellar hemispheres and vermis, in addition to increased size of left precentral gyrus lesion, together concerning for disease progression. 2. Unchanged appearance of right parietal lesion. 3. Diffuse, smooth dural enhancement, which could indicate intracranial hypotension. Electronically Signed   By: Ulyses Jarred M.D.   On: 01/26/2022 21:39   IR PATIENT EVAL TECH 0-60 MINS  Result Date: 01/18/2022 Gavin Pound, RT     01/18/2022  3:17 PM Patient came in today for a [name] site check. Her dressing was removed and the site was cleaned.  Additional Hydrogel was install in the wound and the site was covered with a guaze- Tegaderm dressing.    IR Radiologist Eval & Mgmt  Result Date: 01/24/2022 EXAM: ESTABLISHED PATIENT OFFICE VISIT CHIEF COMPLAINT: Infected port size status post removal 11/17/2021 Current Pain Level: 0 HISTORY OF PRESENT ILLNESS: Patient with history of infected port site status post removal on 11/17/2021. Port originally placed 10/23/2019. Wound care/site checks performed weekly since removal. Today-port site check, patient reports mild tenderness with palpation. Site was flushed with normal saline gently dry with gauze. Old Hydrogel was removed and cleaned. Pocket is clean/pink with no signs of infection. Wound culture was sent to see if any infection is present. Pocket was filled with Hydrogel and covered with Telfa and paper tape. We will notify patient of wound  culture results. Per Dr. Serafina Royals if wound culture is negative, patient may be candidate for scar revision to close wound so it can heal. REVIEW OF SYSTEMS: Not applicable PHYSICAL EXAMINATION: Not applicable ASSESSMENT AND PLAN: We will notify patient of wound culture results and plan for future care of port site. Read by: Narda Rutherford, AGNP-BC Electronically Signed   By: [name] M.D.   On: 01/24/2022 07:44   SLEEP STUDY DOCUMENTS  Result Date: 01/25/2022 Ordered by an unspecified provider.   Labs:  CBC: Recent Labs    12/27/21 0946 01/18/22 1048 02/07/22 1146 02/13/22 1240  WBC 5.6 5.7 6.1 4.9  HGB 12.3 12.6 12.6 13.1  HCT 36.9 37.1 36.7 37.2  PLT 486* 319 447* 216    COAGS: No results for input(s): INR, APTT in the last 8760 hours.  BMP: Recent Labs    12/06/21 1357 12/27/21 0946 01/18/22 1048 02/07/22 1108  NA 139 141 139 139  K 4.2 3.4* 3.4* 3.1*  CL 102 103 100 100  CO2 30 29 32 30  GLUCOSE 120* 120* 123* 116*  BUN $Re'8 8 9 12  'Gjg$ CALCIUM 9.4 9.2 9.3 9.7  CREATININE 0.77 0.79 0.72 0.65  GFRNONAA >60 >60 >60 >60    LIVER FUNCTION TESTS: Recent Labs    12/06/21 1357 12/27/21 0946 01/18/22 1048 02/07/22 1108  BILITOT 0.6 0.4 0.3 0.3  AST $Re'31 24 24 21  'ywi$ ALT $R'26 13 14 13  'Hw$ ALKPHOS  59 60 51 58  PROT 6.6 6.9 7.0 7.0  ALBUMIN 3.1* 3.4* 3.5 3.5    TUMOR MARKERS: No results for input(s): AFPTM, CEA, CA199, CHROMGRNA in the last 8760 hours.  Assessment and Plan:  Metastatic lung carcinoma --s/p port placement and subsequent removal for reported site infection --non-healing wound --OK to proceed with scar revision/wound closure with goal of d/c later today. --left sided port can be considered at a future date.   Risks and benefits of scar revision was discussed with the patient including, but not limited to bleeding, infection, and need for additional procedures.  All of the patient's questions were answered, patient is agreeable to proceed. Consent signed  and in chart.   Thank you for this interesting consult.  I greatly enjoyed meeting Virginia [name] and look forward to participating in their care.  A copy of this report was sent to the requesting provider on this date.  Electronically Signed: Pasty Spillers, PA 02/13/2022, 1:27 PM   I spent a total of 15 Minutes in face to face in clinical consultation, greater than 50% of which was counseling/coordinating care for non-healing wound

## 2022-02-16 ENCOUNTER — Telehealth (HOSPITAL_COMMUNITY): Payer: Self-pay | Admitting: Physician Assistant

## 2022-02-16 NOTE — Progress Notes (Signed)
Patient ID: [name], female   DOB: [birth year], 66 y.o.   MRN: [id number]  Received message that Mrs. Cazarez has questions regarding the wound closure with scar revision performed on 02/13/22 by Dr. Serafina Royals.  Returned call.  Spoke with spouse.  He reports oozing onto dressing on 6/6 and 6/7.  Discharge paperwork said to seek help for bleeding.  He wanted to make certain this was not concerning as we are approaching the weekend.  She has not had any fever or chills, the site is not warm to the touch or have a rash around it.  No more bleeding has been noted on dressings.    Reassured spouse that what he has described is normal healing process.  I have no concerns for bleeding or infection based on his descriptions.  Explained that the bleeding the paperwork is referring to is uncontrolled persistent bleeding. He voices understanding and expresses that his mind is more at ease.  He knows he can call with any additional concerns   Electronically Signed: Pasty Spillers, [name] 02/16/2022, 12:51 PM

## 2022-02-19 ENCOUNTER — Telehealth: Payer: Self-pay | Admitting: Student

## 2022-02-19 ENCOUNTER — Other Ambulatory Visit: Payer: Self-pay | Admitting: Student

## 2022-02-19 ENCOUNTER — Telehealth: Payer: Self-pay | Admitting: *Deleted

## 2022-02-19 ENCOUNTER — Telehealth (HOSPITAL_COMMUNITY): Payer: Self-pay

## 2022-02-19 DIAGNOSIS — T80219A Unspecified infection due to central venous catheter, initial encounter: Secondary | ICD-10-CM

## 2022-02-19 NOTE — Telephone Encounter (Signed)
Called to give appt info for 6/13 at [facility], no answer, left vm. AW

## 2022-02-19 NOTE — Telephone Encounter (Signed)
Received call from pt's husband, [name].  He states [name]'s port site was draining pus at the incision line over the weekend and today. She had low grade temps over the weekend as well.  Contacted [name] Han,PA in IR  to advise her about this.  She was able to schedule pt for tomorrow , 02/20/22 @ 10 am to have port site evaluated.  Call made back to randy to advise him of the above appt. He voiced understanding.  Dr. Lorenso Courier made aware.

## 2022-02-19 NOTE — Telephone Encounter (Signed)
Received a call from [facility], patient;s husband has reached out to the [facility] regarding persistent discharge from the [facility] wound closure site.   Patient is well known to IR service for [facility] wound site check/Hydrogel placement.  Patient has been suffering from non-healing PAC site, the site was revised and closed by Dr. Serafina Royals on 02/13/22.   Patient's husband reached out to [facility] due to persistent discharge from the wound closure site, the discharge appears "puss."  Discussed with Dr. Serafina Royals, decision was made to schedule the patient for  [facility] site check at [facility] tomorrow at 10 am.   IR schedulers notified, appointment was made.  Please call IR for questions and concerns.   Armando Gang Gilbert Narain [name] 02/19/2022 4:08 PM

## 2022-02-20 ENCOUNTER — Ambulatory Visit (HOSPITAL_COMMUNITY)
Admission: RE | Admit: 2022-02-20 | Discharge: 2022-02-20 | Disposition: A | Discharge status: Home / Self Care | Payer: BC Managed Care – PPO | Source: Ambulatory Visit | Attending: Student | Admitting: Student

## 2022-02-20 ENCOUNTER — Encounter (HOSPITAL_COMMUNITY): Payer: Self-pay | Admitting: Radiology

## 2022-02-20 ENCOUNTER — Other Ambulatory Visit: Payer: Self-pay | Admitting: Student

## 2022-02-20 DIAGNOSIS — Y848 Other medical procedures as the cause of abnormal reaction of the patient, or of later complication, without mention of misadventure at the time of the procedure: Secondary | ICD-10-CM | POA: Insufficient documentation

## 2022-02-20 DIAGNOSIS — T80219A Unspecified infection due to central venous catheter, initial encounter: Secondary | ICD-10-CM

## 2022-02-20 DIAGNOSIS — Z4800 Encounter for change or removal of nonsurgical wound dressing: Secondary | ICD-10-CM | POA: Insufficient documentation

## 2022-02-20 HISTORY — PX: IR PATIENT EVAL TECH 0-60 MINS: IMG5564

## 2022-02-20 MED ORDER — CEPHALEXIN 500 MG PO CAPS: 500.0000 mg | ORAL_CAPSULE | Freq: Four times a day (QID) | ORAL | 0 refills | Status: AC

## 2022-02-20 NOTE — Progress Notes (Signed)
Pt presented to IR c/o pus at incision site. Pt had port pocket revision with closure 02/13/22 with Dr. Serafina Royals. Pt and husband report pus seeping from underneath Dermabond.  Upon exam, pt has thick, yellowish discharge seeping from underneath Dermabond. Pt reports tenderness to site. She denies chills, different fever that she already has with chemo, pain at site or other s/sx of infection. Dermabond was removed from incision and cleaned by IR tech, Charlene. Site has mild erythema with no  further discharge from incision. Wound culture collected and sent to lab. Incision dressed with telfa and breathable tape. Keflex 500 mg q 6 hrs for 7 days sent to [facility] in [place] at patient request.   Per Dr. Serafina Royals, pt received referral to Dr. Audelia Hives, plastic surgeon, to consult for difficult surgical wound healing.   Pt and husband are agreeable to plan and will notify IR with questions or concerns.     Narda Rutherford, AGNP-BC 02/20/2022, 12:14 PM

## 2022-02-20 NOTE — Procedures (Signed)
Assisted NP Narda Rutherford with evaluation of port site check 1 week post wound closure. Patient and Husband present; surrounding skin was cleaned with an alcohol swab, and cleaned with saline gauze; Dermabond was removed easily once wet with forceps. Minimal pain reported and closure/internal stitches were undisturbed. Site was evaluated by NP and culture was obtained and taken to the lab. New dressing applied.

## 2022-02-21 ENCOUNTER — Telehealth: Payer: Self-pay | Admitting: *Deleted

## 2022-02-21 NOTE — Progress Notes (Signed)
This encounter was opened by error.

## 2022-02-21 NOTE — Telephone Encounter (Signed)
Received call from pt's husband, Virginia [name]. He states Virginia [name] need a refill On her Oxycodone 5mg  (last filled on 01/23/22 for #90.And her Xtampza 13.5 mg (last filed for #60 on 01/23/22)  Virginia [name] also states Virginia [name] saw IR for pus coming from old port site.  They have started her and antibiotics and will continue to follow her. IR will also make a referral to Plastic Surgery.

## 2022-02-25 LAB — AEROBIC/ANAEROBIC CULTURE W GRAM STAIN (SURGICAL/DEEP WOUND)
Culture: NO GROWTH
Gram Stain: NONE SEEN

## 2022-02-26 ENCOUNTER — Other Ambulatory Visit: Payer: Self-pay | Admitting: *Deleted

## 2022-02-26 ENCOUNTER — Telehealth: Payer: Self-pay | Admitting: *Deleted

## 2022-02-26 ENCOUNTER — Other Ambulatory Visit: Payer: Self-pay | Admitting: Hematology and Oncology

## 2022-02-26 MED ORDER — POTASSIUM CHLORIDE 20 MEQ PO PACK: PACK | ORAL | 3 refills | Status: DC

## 2022-02-26 MED ORDER — OXYCODONE HCL 5 MG PO TABS: ORAL_TABLET | ORAL | 0 refills | Status: DC

## 2022-02-26 MED ORDER — XTAMPZA ER 13.5 MG PO C12A: 1.0000 | EXTENDED_RELEASE_CAPSULE | Freq: Two times a day (BID) | ORAL | 0 refills | Status: DC

## 2022-02-26 NOTE — Telephone Encounter (Signed)
Received call from pt's husband, [name]. He states pt needs refill of her Oxycodone and Xtampza. These are refilled at [facility] on [address]   Also needs refill of KCL. Dr. Lorenso Courier made aware of pain med refill need. This RN refilled KCL per Dr. Lorenso Courier

## 2022-02-28 ENCOUNTER — Inpatient Hospital Stay: Payer: BC Managed Care – PPO

## 2022-02-28 ENCOUNTER — Other Ambulatory Visit: Payer: Self-pay | Admitting: Hematology and Oncology

## 2022-02-28 ENCOUNTER — Other Ambulatory Visit: Payer: Self-pay | Admitting: Lab

## 2022-02-28 ENCOUNTER — Inpatient Hospital Stay: Payer: BC Managed Care – PPO | Attending: Hematology and Oncology | Admitting: Hematology and Oncology

## 2022-02-28 VITALS — BP 128/90 | HR 107 | Temp 97.7°F | Resp 17 | Wt 158.4 lb

## 2022-02-28 DIAGNOSIS — C7951 Secondary malignant neoplasm of bone: Secondary | ICD-10-CM

## 2022-02-28 DIAGNOSIS — Z7901 Long term (current) use of anticoagulants: Secondary | ICD-10-CM | POA: Insufficient documentation

## 2022-02-28 DIAGNOSIS — E032 Hypothyroidism due to medicaments and other exogenous substances: Secondary | ICD-10-CM

## 2022-02-28 DIAGNOSIS — W109XXA Fall (on) (from) unspecified stairs and steps, initial encounter: Secondary | ICD-10-CM | POA: Diagnosis not present

## 2022-02-28 DIAGNOSIS — R42 Dizziness and giddiness: Secondary | ICD-10-CM | POA: Insufficient documentation

## 2022-02-28 DIAGNOSIS — C7931 Secondary malignant neoplasm of brain: Secondary | ICD-10-CM

## 2022-02-28 DIAGNOSIS — C3490 Malignant neoplasm of unspecified part of unspecified bronchus or lung: Secondary | ICD-10-CM

## 2022-02-28 DIAGNOSIS — Z4800 Encounter for change or removal of nonsurgical wound dressing: Secondary | ICD-10-CM | POA: Diagnosis not present

## 2022-02-28 DIAGNOSIS — I10 Essential (primary) hypertension: Secondary | ICD-10-CM | POA: Insufficient documentation

## 2022-02-28 DIAGNOSIS — Z9049 Acquired absence of other specified parts of digestive tract: Secondary | ICD-10-CM | POA: Insufficient documentation

## 2022-02-28 DIAGNOSIS — Z86718 Personal history of other venous thrombosis and embolism: Secondary | ICD-10-CM | POA: Insufficient documentation

## 2022-02-28 DIAGNOSIS — Z79899 Other long term (current) drug therapy: Secondary | ICD-10-CM | POA: Diagnosis not present

## 2022-02-28 DIAGNOSIS — Z5112 Encounter for antineoplastic immunotherapy: Secondary | ICD-10-CM | POA: Insufficient documentation

## 2022-02-28 DIAGNOSIS — C3431 Malignant neoplasm of lower lobe, right bronchus or lung: Secondary | ICD-10-CM | POA: Insufficient documentation

## 2022-02-28 DIAGNOSIS — R918 Other nonspecific abnormal finding of lung field: Secondary | ICD-10-CM

## 2022-02-28 LAB — CMP (CANCER CENTER ONLY)
ALT: 19 U/L (ref 0–44)
AST: 23 U/L (ref 15–41)
Albumin: 3.8 g/dL (ref 3.5–5.0)
Alkaline Phosphatase: 73 U/L (ref 38–126)
Anion gap: 8 (ref 5–15)
BUN: 11 mg/dL (ref 8–23)
CO2: 33 mmol/L — ABNORMAL HIGH (ref 22–32)
Calcium: 9.9 mg/dL (ref 8.9–10.3)
Chloride: 98 mmol/L (ref 98–111)
Creatinine: 0.88 mg/dL (ref 0.44–1.00)
GFR, Estimated: >60 mL/min (ref >60)
Glucose, Bld: 108 mg/dL — ABNORMAL HIGH (ref 70–99)
Potassium: 3.2 mmol/L — ABNORMAL LOW (ref 3.5–5.1)
Sodium: 139 mmol/L (ref 135–145)
Total Bilirubin: 0.3 mg/dL (ref 0.3–1.2)
Total Protein: 7.6 g/dL (ref 6.5–8.1)

## 2022-02-28 LAB — CBC WITH DIFFERENTIAL (CANCER CENTER ONLY)
Abs Immature Granulocytes: 0.02 10*3/uL (ref 0.00–0.07)
Basophils Absolute: 0.1 10*3/uL (ref 0.0–0.1)
Basophils Relative: 1 %
Eosinophils Absolute: 0.2 10*3/uL (ref 0.0–0.5)
Eosinophils Relative: 3 %
HCT: 36.5 % (ref 36.0–46.0)
Hemoglobin: 12.6 g/dL (ref 12.0–15.0)
Immature Granulocytes: 0 %
Lymphocytes Relative: 31 %
Lymphs Abs: 2.1 10*3/uL (ref 0.7–4.0)
MCH: 33.2 pg (ref 26.0–34.0)
MCHC: 34.5 g/dL (ref 30.0–36.0)
MCV: 96.3 fL (ref 80.0–100.0)
Monocytes Absolute: 0.9 10*3/uL (ref 0.1–1.0)
Monocytes Relative: 13 %
Neutro Abs: 3.6 10*3/uL (ref 1.7–7.7)
Neutrophils Relative %: 52 %
Platelet Count: 396 10*3/uL (ref 150–400)
RBC: 3.79 MIL/uL — ABNORMAL LOW (ref 3.87–5.11)
RDW: 14.1 % (ref 11.5–15.5)
WBC Count: 6.9 10*3/uL (ref 4.0–10.5)
nRBC: 0 % (ref 0.0–0.2)

## 2022-02-28 LAB — TSH: TSH: 2.589 u[IU]/mL (ref 0.350–4.500)

## 2022-02-28 MED ORDER — SODIUM CHLORIDE 0.9 % IV SOLN
500.0000 mg/m2 | Freq: Once | INTRAVENOUS | Status: AC
  Administered 2022-02-28: 900 mg via INTRAVENOUS
  Filled 2022-02-28: qty 20

## 2022-02-28 MED ORDER — PROCHLORPERAZINE MALEATE 10 MG PO TABS
10.0000 mg | ORAL_TABLET | Freq: Once | ORAL | Status: AC
  Administered 2022-02-28: 10 mg via ORAL
  Filled 2022-02-28: qty 1

## 2022-02-28 MED ORDER — SODIUM CHLORIDE 0.9 % IV SOLN
200.0000 mg | Freq: Once | INTRAVENOUS | Status: AC
  Administered 2022-02-28: 200 mg via INTRAVENOUS
  Filled 2022-02-28: qty 200

## 2022-02-28 MED ORDER — OXYCODONE-ACETAMINOPHEN 5-325 MG PO TABS: 1.0000 | ORAL_TABLET | Freq: Once | ORAL | Status: DC

## 2022-02-28 MED ORDER — OXYCODONE HCL 5 MG PO TABS
5.0000 mg | ORAL_TABLET | Freq: Once | ORAL | Status: AC
  Administered 2022-02-28: 5 mg via ORAL
  Filled 2022-02-28: qty 1

## 2022-02-28 MED ORDER — ACETAMINOPHEN 325 MG PO TABS
325.0000 mg | ORAL_TABLET | Freq: Once | ORAL | Status: AC
  Administered 2022-02-28: 325 mg via ORAL
  Filled 2022-02-28: qty 1

## 2022-02-28 MED ORDER — SODIUM CHLORIDE 0.9 % IV SOLN: Freq: Once | INTRAVENOUS | Status: AC

## 2022-02-28 NOTE — Progress Notes (Signed)
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 CT of the head showed showed concerning for metastatic disease involving the left cerebellum and right occipital lobe.  2) 09/30/2019: MRI brain confirms mass within the left cerebellum measures 1.6 x 1.3 x 1.5 cm as well as at least 8 metastatic lesions within the supratentorial and infratentorial brain as outlined 3) 10/01/2019: PET CT scan revealed hypermetabolic infrahilar right lower lobe mass with hypermetabolic nodal metastases in the right hilum, mediastinum, right supraclavicular region, left axilla and lower left neck. 4) 10/08/2019: US guided biopsy of the lymph nodes confirms poorly differentiated non-small cell carcinoma. Immunohistochemistry confirms adenocarcinoma of lung primary. PD-L1 and NGS later revealed a MET Exon 14 mutation and TPS of 90%.  5) 10/12/2019: Establish care with Dr. Lorenso Courier  6) 11/04/2019: Day 1 of capmatinib 427m BID 7) 11/09/2019: presented to Rad/Onc with a DVT in LLE. Seen in symptom management clinic and started on apixaban.  8) 12/29/2019: CT C/A/P showed interval decrease in size of mass involving the superior segment of right lower lobe and reduction in mediastinal and left supraclavicular adenopathy though some small spinal lesions were noted in the interim between the two sets of scans 9) 01/25/2020: temporarily stopped capmatinib due to symptoms of nausea/fatigue. Restarted therapy on 01/30/2020 after brief chemo holiday.  10)  03/22/2020: CT C/A/P reveals a mixed picture, with new lung nodule and increased lymphadenopathy, however response in bone lesions. D/c capmatinib therapy 11) 04/08/2020: start of Carbo/Pem/Pem for 2nd line treatment. Cycle 1 Day 1   12) 04/29/2020: Cycle 2 Day 1 Carbo/Pem/Pem  13) 05/20/2020: Cycle 3 Day 1 Carbo/Pem/Pem  14) 06/09/2020: Cycle 4 Day 1 Carbo/Pem/Pem  15) 06/29/2020: Cycle 5 Day 1 maintenance Pem/Pem  16) 07/20/2020: Cycle 6 Day 1 maintenance Pem/Pem  17) 08/12/2020: Cycle 7 Day 1 maintenance Pem/Pem  18) 09/01/2020: Cycle 8 Day 1 maintenance Pem/Pem  19) 09/21/2020: Cycle 9 Day 1 maintenance Pem/Pem  20) 10/13/2020: Cycle 10 Day 1 maintenance Pem/Pem  21) 11/03/2020: Cycle 11 Day 1 maintenance Pem/Pem  22) 11/25/2020:  Cycle 12 Day 1 maintenance Pem/Pem 23) 12/15/2020:  Cycle 13 Day 1 maintenance Pem/Pem 24) 01/05/2021:  Cycle 14 Day 1 maintenance Pem/Pem 25) 01/26/2021:  Cycle 15 Day 1 maintenance Pem/Pem plus Bevacizumab.  02/17/2021: Cycle 16 Day 1 maintenance Pem/Pem plus Bevacizumab.  03/10/2021: Cycle 17 Day 1 maintenance Pem/Pem plus Bevacizumab.  03/31/2021: Cycle 18 Day 1 maintenance Pem/Pem  04/20/2021:  Cycle 19 Day 1 maintenance Pem/Pem  05/12/2021: Cycle 20 Day 1 maintenance Pem/Pem 06/01/2021: Cycle 21 Day 1 maintenance Pem/Pem 06/22/2021: Cycle 22 Day 1 maintenance Pem/Pem 07/13/2021: Cycle 23 Day 1 maintenance Pem/Pem 08/16/2021: Cycle 24 Day 1 maintenance Pem/Pem 09/08/2021: Cycle 25 Day 1 maintenance Pem/Pem plus Bevacizumab 09/28/2021: Cycle 26 Day 1 maintenance Pem/Pem plus Bevacizumab 10/20/2021: Cycle 27 Day 1 maintenance Pem/Pem plus Bevacizumab 11/09/2021: Cycle 28 Day 1 maintenance Pem/Pem  12/06/2021: Cycle 29 Day 1 maintenance Pem/Pem  12/27/2021: Cycle 30 Day 1 maintenance Pem/Pem  01/18/2022: Cycle 31 Day 1 maintenance Pem/Pem  02/07/2022: Cycle 32 Day  1 maintenance Pem/Pem  02/28/2022: Cycle 33 Day 1 maintenance Pem/Pem   Interval History:  NEOLA WORRALL 66 y.o.  female with medical history significant for metastatic adenocarcinoma of the lung presents for a follow up visit. The patient's last visit was on 02/07/2022.  In the interim since the last visit, she continues on maintenance Pem/Pem.   On exam today Mrs. Stebbins is accompanied by her husband.  She reports she continues to struggle with dizziness.  She reports that she does not want to proceed with Avastin which she had previously been on for the symptom.  She notes that she is eating quite well and her husband is feeding her good food.  She is eating chicken wings, steak, and numerous good food options.  Appetite is not an issue.  She went to [employer] recently.  She notes she is also been enjoying eating cherries.  She has had some issues with swelling in her lower extremities but that this is improving.  She has been "sleeping a lot".  She notes that she is willing and able to proceed with chemotherapy at this time and does not think that the concerning symptoms she has are related to this.  She does that she has some occasional queasiness but no frank nausea or vomiting.  Her port site is healing up well.  She has no other complaints. Rest of the 10 point ROS is below.   MEDICAL HISTORY:  Past Medical History:  Diagnosis Date   Anemia    Anxiety    Concussion 09/28/2019   DVT (deep venous thrombosis) ([place]) 2021   L leg   Dyspnea    GERD (gastroesophageal reflux disease)    Hypercholesterolemia    per pt, she does not have elevated lipids   Hypertension    met lung ca dx'd 09/2019   mets to spine, hip and brain   PONV (postoperative nausea and vomiting)    Tobacco abuse     SURGICAL HISTORY: Past Surgical History:  Procedure Laterality Date   ABDOMINAL HYSTERECTOMY     partial/ left ovaries   APPLICATION OF CRANIAL NAVIGATION N/A 12/02/2020   Procedure: APPLICATION OF CRANIAL NAVIGATION;  Surgeon: Erline Levine, MD;  Location: [facility];  Service: Neurosurgery;  Laterality: N/A;    CHOLECYSTECTOMY     CRANIOTOMY N/A 12/02/2020   Procedure: Posterior fossa craniotomy for tumor resection with brainlab;  Surgeon: Erline Levine, MD;  Location: [facility];  Service: Neurosurgery;  Laterality: N/A;   DILATION AND CURETTAGE OF UTERUS     IR IMAGING GUIDED PORT INSERTION  10/23/2019   IR PATIENT EVAL TECH 0-60 MINS  11/21/2021   IR PATIENT EVAL TECH 0-60 MINS  11/24/2021   IR PATIENT EVAL TECH 0-60 MINS  11/29/2021   IR PATIENT EVAL TECH 0-60 MINS  12/04/2021   IR PATIENT EVAL TECH 0-60 MINS  12/12/2021   IR PATIENT EVAL TECH 0-60 MINS  12/19/2021   IR PATIENT EVAL TECH 0-60 MINS  12/27/2021   IR PATIENT EVAL TECH 0-60 MINS  01/03/2022   IR PATIENT EVAL TECH 0-60 MINS  01/10/2022   IR PATIENT EVAL TECH 0-60 MINS  01/18/2022   IR PATIENT EVAL TECH 0-60 MINS  02/13/2022   IR PATIENT EVAL TECH 0-60 MINS  02/20/2022   IR RADIOLOGIST EVAL & MGMT  01/23/2022   IR REMOVAL TUN ACCESS W/ PORT W/O FL MOD SED  11/17/2021   KYPHOPLASTY N/A 03/15/2020   Procedure: Thoracic Eight KYPHOPLASTY;  Surgeon: Erline Levine, MD;  Location: [place];  Service: Neurosurgery;  Laterality: N/A;  prone    LIPOMA EXCISION  2018   removed under left breast and right thigh.   TUBAL LIGATION      ALLERGIES:  has No Known Allergies.  MEDICATIONS:  Current Outpatient Medications  Medication Sig Dispense Refill   acetaminophen (TYLENOL) 500 MG tablet Take 500 mg by mouth 2 (two) times daily as needed for moderate pain.     albuterol (VENTOLIN HFA) 108 (90 Base) MCG/ACT inhaler INHALE 2 PUFFS INTO THE LUNGS EVERY 6 HOURS AS NEEDED FOR WHEEZING OR SHORTNESS OF BREATH 6.7 g 11   ALPRAZolam (XANAX) 0.25 MG tablet Take 1 tablet (0.25 mg total) by mouth 2 (two) times daily as needed for anxiety 60 tablet 1   amLODipine (NORVASC) 10 MG tablet Take 10 mg by mouth daily.     Cyanocobalamin (VITAMIN B12 PO) Take 1 tablet by mouth daily. Gummies     EPINEPHrine 0.3 mg/0.3 mL IJ SOAJ injection Inject 0.3 mg into the muscle as needed for  anaphylaxis. 2 each 0   folic acid (FOLVITE) 1 MG tablet Take 1 tablet by mouth daily. 90 tablet 1   furosemide (LASIX) 20 MG tablet Take 2 tablets (40 mg total) by mouth 2 times daily. 360 tablet 0   Glucosamine HCl (GLUCOSAMINE PO) Take 2 tablets by mouth daily.     lidocaine-prilocaine (EMLA) cream Apply to port as needed. 30 g 0   LYSINE PO Take 1 tablet by mouth daily.     meclizine (ANTIVERT) 25 MG tablet Take 1 tablet (25 mg total) by mouth 3 (three) times daily as needed for dizziness. (Patient not taking: Reported on 02/09/2022) 30 tablet 0   Multiple Vitamin (MULTIVITAMIN WITH MINERALS) TABS tablet Take 1 tablet by mouth daily.     Multiple Vitamins-Minerals (AIRBORNE PO) Take 1 tablet by mouth daily.      OLANZapine (ZYPREXA) 2.5 MG tablet TAKE 1 TABLET BY MOUTH AT BEDTIME 90 tablet 1   ondansetron (ZOFRAN) 4 MG tablet Take 1 tablet (4 mg total) by mouth every 8 (eight) hours as needed for nausea or vomiting. 40 tablet 2   OVER THE COUNTER MEDICATION Take 2 tablets by mouth daily. Beet root     oxyCODONE (OXY IR/ROXICODONE) 5 MG immediate release tablet TAKE 1 TO 2 TABLETS BY MOUTH EVERY 4 HOURS AS NEEDED FOR SEVERE PAIN 90 tablet 0   pantoprazole (PROTONIX) 40 MG tablet TAKE 1 TABLET BY MOUTH ONCE DAILY 90 tablet 3   polyethylene glycol (MIRALAX / GLYCOLAX) 17 g packet Take 17 g by mouth every evening.     potassium chloride (KLOR-CON) 20 MEQ packet Take 40 mEq in the morning and 20 mEq in the evening. 90 packet 3   Tetrahydrozoline HCl (VISINE OP) Place 1 drop into both eyes daily as needed (irritation).     VITAMIN D PO Take 1 capsule by mouth daily.     vitamin E 180 MG (400 UNITS) capsule Take 400 IU daily x 1 week, then 400 IU BID (Patient taking differently: Take 400 Units by mouth daily.) 60 capsule 4   XTAMPZA ER 13.5 MG C12A Take 1 capsule by mouth in the morning and at bedtime. (every 12 hours) 60 capsule 0   No current facility-administered medications for this visit.     REVIEW OF SYSTEMS:   Constitutional: ( - ) fevers, ( - )  chills , ( - ) night sweats  Eyes: ( - ) blurriness of vision, ( - ) double vision, ( - ) watery eyes Ears, nose, mouth, throat, and face: ( - ) mucositis, ( - ) sore throat Respiratory: ( - ) cough, ( + ) dyspnea, ( - ) wheezes Cardiovascular: ( - ) palpitation, ( - ) chest discomfort, ( - ) lower extremity swelling Gastrointestinal:  ( + ) nausea, ( - ) heartburn, ( - ) change in bowel habits Skin: ( - ) abnormal skin rashes Lymphatics: ( - ) new lymphadenopathy, ( - ) easy bruising Neurological: ( - ) numbness, ( - ) tingling, ( - ) new weaknesses Behavioral/Psych: ( - ) mood change, ( - ) new changes  All other systems were reviewed with the patient and are negative.  PHYSICAL EXAMINATION: ECOG PERFORMANCE STATUS: 1 - Symptomatic but completely ambulatory  Vitals:   02/28/22 1203  BP: 128/90  Pulse: (!) 107  Resp: 17  Temp: 97.7 F (36.5 C)  SpO2: 100%     Filed Weights   02/28/22 1203  Weight: 158 lb 6.4 oz (71.8 kg)   GENERAL: well appearing middle aged Caucasian female in NAD  SKIN: skin color, texture, turgor are normal, no rashes or significant lesions EYES: conjunctiva are pink and non-injected, sclera clear LUNGS: wheezing at lung bases. clear to auscultation and percussion with normal breathing effort HEART: regular rate & rhythm and no murmurs and bilateral trace pitting lower extremity edema Musculoskeletal: no cyanosis of digits and no clubbing PSYCH: alert & oriented x 3, fluent speech NEURO: no focal motor/sensory deficits  LABORATORY DATA:  I have reviewed the data as listed    Latest Ref Rng & Units 02/28/2022   11:11 AM 02/13/2022   12:40 PM 02/07/2022   11:46 AM  CBC  WBC 4.0 - 10.5 K/uL 6.9  4.9  6.1   Hemoglobin 12.0 - 15.0 g/dL 12.6  13.1  12.6   Hematocrit 36.0 - 46.0 % 36.5  37.2  36.7   Platelets 150 - 400 K/uL 396  216  447        Latest Ref Rng & Units 02/28/2022   11:11 AM  02/07/2022   11:08 AM 01/18/2022   10:48 AM  CMP  Glucose 70 - 99 mg/dL 108  116  123   BUN 8 - 23 mg/dL $Remove'11  12  9   'naENwwa$ Creatinine 0.44 - 1.00 mg/dL 0.88  0.65  0.72   Sodium 135 - 145 mmol/L 139  139  139   Potassium 3.5 - 5.1 mmol/L 3.2  3.1  3.4   Chloride 98 - 111 mmol/L 98  100  100   CO2 22 - 32 mmol/L 33  30  32   Calcium 8.9 - 10.3 mg/dL 9.9  9.7  9.3   Total Protein 6.5 - 8.1 g/dL 7.6  7.0  7.0   Total Bilirubin 0.3 - 1.2 mg/dL 0.3  0.3  0.3   Alkaline Phos 38 - 126 U/L 73  58  51   AST 15 - 41 U/L $Remo'23  21  24   'zjGiF$ ALT 0 - 44 U/L $Remo'19  13  14      'Hlqke$ RADIOGRAPHIC STUDIES: I personally have viewed the radiographic studies below: IR PATIENT EVAL TECH 0-60 MINS  Result Date: 02/20/2022 Janine Limbo, RT     02/20/2022  2:49 PM Assisted NP Narda Rutherford with evaluation of port site check 1 week post wound closure. Patient and Husband present; surrounding skin was  cleaned with an alcohol swab, and cleaned with saline gauze; Dermabond was removed easily once wet with forceps. Minimal pain reported and closure/internal stitches were undisturbed. Site was evaluated by NP and culture was obtained and taken to the lab. New dressing applied.  IR PATIENT EVAL TECH 0-60 MINS  Result Date: 02/13/2022 Araceli Bouche     02/13/2022  3:28 PM Patient was brought into Interventional Radiology for wound closure of after Port-a-Cath removal. See Dr. Malachi Carl notes for full procedure dictation    ASSESSMENT & PLAN Virginia [name] 67 y.o. female with medical history significant for metastatic adenocarcinoma of the lung presents for a follow up visit. 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   Previously we discussed Carboplatin/Pembrolizumab/Pemetrexed and the expected side effect from these medications.  We discussed the possible side effects of immunotherapy including colitis, hepatitis, dermatitis, and pneumonitis.  Additionally we discussed the side effects of  carboplatin which include suppression of blood counts, nephrotoxicity, and nausea.  Additionally we discussed pemetrexed which can also have effects on counts and would require supplementation with vitamin X93 and folic acid.  She voiced her understanding of these side effects and treatment plans moving forward.  I also noted that we would be able to drop the chemotherapy agents that she had intolerance to continue with pembrolizumab alone given her high TPS score.  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 Due to worsening dizziness, she resumed this from Cycle 25-27.  --repeat CT CAP on 12/11/2021 showed stable disease with no evidence of progression. Next due in July 2023.  PLAN: --Labs from today were reviewed and adequate for treatment. --Continue with Cycle 33, Day 1 of maintenance Pem/Pem. --RTC q 3 weeks for labs, follow up visit with Dr. Lorenso Courier prior to Cycle 34.    #Nausea/Vomiting, improving -- provided patient with Zofran 4-8mg  q8h PRN and compazine 10mg  q6H for breakthrough PRN.  She is currently using Phenergan for breakthrough. --continue olanzpaine 2.5 mg PO QHS to help with nausea. Marked improvement since the addition of this medication on 01/29/2020.  --continue to monitor   #Hypokalemia, stable --likely 2/2 to decreased PO intake, hypovolemia, and BP medications --Potassium level 3.2 today.  Gave 40 mEq of oral potassium chloride with treatment today.  --Currently on potassium chloride tablets, 40 mEq PO potassium in AM and 7mEq in PM. Due to difficulty swallowing tablets, we will try granules. Sent new prescription today.   #Pain Control, stable --continue oxycodone 5mg  q4H PRN. Can increase dose to 10mg  q6H if pain is severe. She is taking approximately 2-3 pills  per day.  --continue Xtampza ER BID for long acting support.  --continue to take with Senokot to prevent opioid induced constipation. 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 Last 01/18/2022, next due in August 2023.  --nausea as above    #Brain Metastasis, stable --appreciate the assistance of Dr. 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 --radiation therapy to the brain completed on 11/16/2019.  --MRI brain on 04/29/2020 showed evidence of small brain bleed. Eliquis therapy was discontinued.  --MRI on 11/04/2020 showed concern for progression of intracranial disease.  --On 12/02/2020, patient underwent craniotomy, resection of  progressive cerebellar mass by Dr. Vertell Limber. 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  --MRI on 12/23/2020 showed evidence of radiation necrosis. Dr. Mickeal Skinner evaluated the patient on 01/02/2021 with recommendations to add IV Bevacizumab 10 mg/kg q 3 weeks ( started 01/26/2021). Completed a full course of this therapy. --05/26/2021: MRI brain showed no substantial changes.  --Patient was evaluated by Dr. Mickeal Skinner on 08/29/2021 and recommended to resume Bevacizumab due to worsening dizziness.  Which was given from Cycle 25-27.   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    # Hypertension: --BP at today's visit was 128/90. Continue Norvasc to 10 mg daily.  --continue to monitor at home and in clinic.    No orders of the defined types were placed in this encounter.   All questions were answered. The patient knows to call the clinic with any problems, questions or concerns.  I have spent a total of 30 minutes minutes of face-to-face and non-face-to-face time, preparing to see the patient, performing a medically appropriate  examination, counseling and educating the patient,  documenting clinical information in the electronic health record, and care coordination.   Ledell Peoples, MD Department of Hematology/Oncology [facility] Phone: [phone] Pager: [phone] Email: [email or web address]   02/28/2022 5:27 PM   Literature Support:  Lorenza Chick, Rodrguez-Abreu D, Duffy Rhody, Felip E, De Angelis F, Domine M, [place], Hochmair MJ, [place], [place], Bischoff HG, [place], Grossi F, [place], Reck M, [place], [place], [place], Rubio-Viqueira B, Novello S, Kurata T, [place] JE, Vida J, Wei Z, Yang J, Raftopoulos H, [place], [place] [employer]; [id number] Investigators. Pembrolizumab plus Chemotherapy in Metastatic Non-Small-Cell Lung Cancer. [place] Med. 2018 May 31;378(22):2078-2092.  --Median progression-free survival was 8.8 months (95% CI, 7.6 to 9.2) in the pembrolizumab-combination group and 4.9 months (95% CI, 4.7 to 5.5) in the placebo-combination group (hazard ratio for disease progression or death, 0.52; 95% CI, 0.43 to 0.64; P<0.001). Adverse events of grade 3 or higher occurred in 67.2% of the patients in the pembrolizumab-combination group and in 65.8% of those in the placebo-combination group.  Jodene Nam L. Efficacy of pemetrexed-based regimens in advanced non-small cell lung cancer patients with activating epidermal growth factor receptor mutations after tyrosine kinase inhibitor failure: a systematic review. Onco Targets Ther. 2018;11:2121-2129.   --The weighted median PFS, median OS, and ORR for patients treated with pem regimens were 5.09 months, 15.91 months, and 30.19%, respectively. Our systematic review results showed a favorable efficacy profile of pem regimens in NSCLC patients with EGFR mutation after EGFR-TKI failure.

## 2022-02-28 NOTE — Progress Notes (Signed)
Per Dr. Lorenso Courier, okay to treat with elevated HR today.

## 2022-02-28 NOTE — Patient Instructions (Signed)
[facility]   Discharge Instructions: Thank you for choosing [facility] to provide your oncology and hematology care.   If you have a lab appointment with the [facility], please go directly to the [facility] and check in at the registration area.   Wear comfortable clothing and clothing appropriate for easy access to any Portacath or PICC line.   We strive to give you quality time with your provider. You may need to reschedule your appointment if you arrive late (15 or more minutes).  Arriving late affects you and other patients whose appointments are after yours.  Also, if you miss three or more appointments without notifying the office, you may be dismissed from the clinic at the provider's discretion.      For prescription refill requests, have your pharmacy contact our office and allow 72 hours for refills to be completed.    Today you received the following chemotherapy and/or immunotherapy agents: pembrolizumab and pemetrexed      To help prevent nausea and vomiting after your treatment, we encourage you to take your nausea medication as directed.  BELOW ARE SYMPTOMS THAT SHOULD BE REPORTED IMMEDIATELY: *FEVER GREATER THAN 100.4 F (38 C) OR HIGHER *CHILLS OR SWEATING *NAUSEA AND VOMITING THAT IS NOT CONTROLLED WITH YOUR NAUSEA MEDICATION *UNUSUAL SHORTNESS OF BREATH *UNUSUAL BRUISING OR BLEEDING *URINARY PROBLEMS (pain or burning when urinating, or frequent urination) *BOWEL PROBLEMS (unusual diarrhea, constipation, pain near the anus) TENDERNESS IN MOUTH AND THROAT WITH OR WITHOUT PRESENCE OF ULCERS (sore throat, sores in mouth, or a toothache) UNUSUAL RASH, SWELLING OR PAIN  UNUSUAL VAGINAL DISCHARGE OR ITCHING   Items with * indicate a potential emergency and should be followed up as soon as possible or go to the Emergency Department if any problems should occur.  Please show the CHEMOTHERAPY ALERT CARD or IMMUNOTHERAPY ALERT  CARD at check-in to the Emergency Department and triage nurse.  Should you have questions after your visit or need to cancel or reschedule your appointment, please contact [facility]  Dept: [phone]  and follow the prompts.  Office hours are 8:00 a.m. to 4:30 p.m. Monday - Friday. Please note that voicemails left after 4:00 p.m. may not be returned until the following business day.  We are closed weekends and major holidays. You have access to a nurse at all times for urgent questions. Please call the main number to the clinic Dept: [phone] and follow the prompts.   For any non-urgent questions, you may also contact your provider using MyChart. We now offer e-Visits for anyone 12 and older to request care online for non-urgent symptoms. For details visit [email or web address].   Also download the MyChart app! Go to the app store, search "MyChart", open the app, select [facility], and log in with your MyChart username and password.  Masks are optional in the cancer centers. If you would like for your care team to wear a mask while they are taking care of you, please let them know. For doctor visits, patients may have with them one support person who is at least 65 years old. At this time, visitors are not allowed in the infusion area.

## 2022-03-02 ENCOUNTER — Other Ambulatory Visit: Payer: Self-pay | Admitting: *Deleted

## 2022-03-02 ENCOUNTER — Other Ambulatory Visit (HOSPITAL_COMMUNITY): Payer: Self-pay

## 2022-03-02 MED ORDER — POTASSIUM CHLORIDE CRYS ER 20 MEQ PO TBCR: 20.0000 meq | EXTENDED_RELEASE_TABLET | Freq: Two times a day (BID) | ORAL | 3 refills | Status: DC

## 2022-03-23 ENCOUNTER — Inpatient Hospital Stay: Payer: BC Managed Care – PPO | Attending: Hematology and Oncology | Admitting: Hematology and Oncology

## 2022-03-23 ENCOUNTER — Inpatient Hospital Stay: Payer: BC Managed Care – PPO

## 2022-03-23 ENCOUNTER — Other Ambulatory Visit: Payer: Self-pay

## 2022-03-23 VITALS — BP 141/85 | HR 95 | Temp 97.5°F | Resp 16 | Wt 151.3 lb

## 2022-03-23 VITALS — BP 117/82 | HR 94 | Temp 98.8°F | Resp 16

## 2022-03-23 DIAGNOSIS — C7931 Secondary malignant neoplasm of brain: Secondary | ICD-10-CM

## 2022-03-23 DIAGNOSIS — I10 Essential (primary) hypertension: Secondary | ICD-10-CM | POA: Insufficient documentation

## 2022-03-23 DIAGNOSIS — R918 Other nonspecific abnormal finding of lung field: Secondary | ICD-10-CM

## 2022-03-23 DIAGNOSIS — Z5111 Encounter for antineoplastic chemotherapy: Secondary | ICD-10-CM | POA: Insufficient documentation

## 2022-03-23 DIAGNOSIS — Z5112 Encounter for antineoplastic immunotherapy: Secondary | ICD-10-CM | POA: Insufficient documentation

## 2022-03-23 DIAGNOSIS — C7951 Secondary malignant neoplasm of bone: Secondary | ICD-10-CM | POA: Diagnosis not present

## 2022-03-23 DIAGNOSIS — R112 Nausea with vomiting, unspecified: Secondary | ICD-10-CM | POA: Insufficient documentation

## 2022-03-23 DIAGNOSIS — Z9049 Acquired absence of other specified parts of digestive tract: Secondary | ICD-10-CM | POA: Diagnosis not present

## 2022-03-23 DIAGNOSIS — E032 Hypothyroidism due to medicaments and other exogenous substances: Secondary | ICD-10-CM

## 2022-03-23 DIAGNOSIS — Z79899 Other long term (current) drug therapy: Secondary | ICD-10-CM | POA: Diagnosis not present

## 2022-03-23 DIAGNOSIS — Z7901 Long term (current) use of anticoagulants: Secondary | ICD-10-CM | POA: Insufficient documentation

## 2022-03-23 DIAGNOSIS — Z86718 Personal history of other venous thrombosis and embolism: Secondary | ICD-10-CM | POA: Diagnosis not present

## 2022-03-23 DIAGNOSIS — C3490 Malignant neoplasm of unspecified part of unspecified bronchus or lung: Secondary | ICD-10-CM | POA: Diagnosis not present

## 2022-03-23 DIAGNOSIS — C3431 Malignant neoplasm of lower lobe, right bronchus or lung: Secondary | ICD-10-CM | POA: Insufficient documentation

## 2022-03-23 DIAGNOSIS — E876 Hypokalemia: Secondary | ICD-10-CM | POA: Diagnosis not present

## 2022-03-23 LAB — CMP (CANCER CENTER ONLY)
ALT: 17 U/L (ref 0–44)
AST: 22 U/L (ref 15–41)
Albumin: 3.8 g/dL (ref 3.5–5.0)
Alkaline Phosphatase: 79 U/L (ref 38–126)
Anion gap: 10 (ref 5–15)
BUN: 9 mg/dL (ref 8–23)
CO2: 30 mmol/L (ref 22–32)
Calcium: 9.4 mg/dL (ref 8.9–10.3)
Chloride: 98 mmol/L (ref 98–111)
Creatinine: 0.8 mg/dL (ref 0.44–1.00)
GFR, Estimated: >60 mL/min (ref >60)
Glucose, Bld: 135 mg/dL — ABNORMAL HIGH (ref 70–99)
Potassium: 2.9 mmol/L — ABNORMAL LOW (ref 3.5–5.1)
Sodium: 138 mmol/L (ref 135–145)
Total Bilirubin: 0.5 mg/dL (ref 0.3–1.2)
Total Protein: 7.6 g/dL (ref 6.5–8.1)

## 2022-03-23 LAB — CBC WITH DIFFERENTIAL (CANCER CENTER ONLY)
Abs Immature Granulocytes: 0.01 10*3/uL (ref 0.00–0.07)
Basophils Absolute: 0.1 10*3/uL (ref 0.0–0.1)
Basophils Relative: 1 %
Eosinophils Absolute: 0.1 10*3/uL (ref 0.0–0.5)
Eosinophils Relative: 1 %
HCT: 38.6 % (ref 36.0–46.0)
Hemoglobin: 13.9 g/dL (ref 12.0–15.0)
Immature Granulocytes: 0 %
Lymphocytes Relative: 23 %
Lymphs Abs: 1.5 10*3/uL (ref 0.7–4.0)
MCH: 33.5 pg (ref 26.0–34.0)
MCHC: 36 g/dL (ref 30.0–36.0)
MCV: 93 fL (ref 80.0–100.0)
Monocytes Absolute: 0.9 10*3/uL (ref 0.1–1.0)
Monocytes Relative: 14 %
Neutro Abs: 4.1 10*3/uL (ref 1.7–7.7)
Neutrophils Relative %: 61 %
Platelet Count: 382 10*3/uL (ref 150–400)
RBC: 4.15 MIL/uL (ref 3.87–5.11)
RDW: 14.3 % (ref 11.5–15.5)
WBC Count: 6.7 10*3/uL (ref 4.0–10.5)
nRBC: 0 % (ref 0.0–0.2)

## 2022-03-23 LAB — TSH: TSH: 1.831 u[IU]/mL (ref 0.350–4.500)

## 2022-03-23 MED ORDER — SODIUM CHLORIDE 0.9 % IV SOLN: Freq: Once | INTRAVENOUS | Status: AC

## 2022-03-23 MED ORDER — PROCHLORPERAZINE MALEATE 10 MG PO TABS
10.0000 mg | ORAL_TABLET | Freq: Once | ORAL | Status: AC
  Administered 2022-03-23: 10 mg via ORAL
  Filled 2022-03-23: qty 1

## 2022-03-23 MED ORDER — SODIUM CHLORIDE 0.9 % IV SOLN
500.0000 mg/m2 | Freq: Once | INTRAVENOUS | Status: AC
  Administered 2022-03-23: 900 mg via INTRAVENOUS
  Filled 2022-03-23: qty 20

## 2022-03-23 MED ORDER — SODIUM CHLORIDE 0.9 % IV SOLN
200.0000 mg | Freq: Once | INTRAVENOUS | Status: AC
  Administered 2022-03-23: 200 mg via INTRAVENOUS
  Filled 2022-03-23: qty 200

## 2022-03-23 MED ORDER — POTASSIUM CHLORIDE CRYS ER 20 MEQ PO TBCR: 20.0000 meq | EXTENDED_RELEASE_TABLET | Freq: Two times a day (BID) | ORAL | 3 refills | Status: DC

## 2022-03-23 NOTE — Patient Instructions (Signed)
[facility]   Discharge Instructions: Thank you for choosing [facility] to provide your oncology and hematology care.   If you have a lab appointment with the [facility], please go directly to the [facility] and check in at the registration area.   Wear comfortable clothing and clothing appropriate for easy access to any Portacath or PICC line.   We strive to give you quality time with your provider. You may need to reschedule your appointment if you arrive late (15 or more minutes).  Arriving late affects you and other patients whose appointments are after yours.  Also, if you miss three or more appointments without notifying the office, you may be dismissed from the clinic at the provider's discretion.      For prescription refill requests, have your pharmacy contact our office and allow 72 hours for refills to be completed.    Today you received the following chemotherapy and/or immunotherapy agents: pembrolizumab and pemetrexed      To help prevent nausea and vomiting after your treatment, we encourage you to take your nausea medication as directed.  BELOW ARE SYMPTOMS THAT SHOULD BE REPORTED IMMEDIATELY: *FEVER GREATER THAN 100.4 F (38 C) OR HIGHER *CHILLS OR SWEATING *NAUSEA AND VOMITING THAT IS NOT CONTROLLED WITH YOUR NAUSEA MEDICATION *UNUSUAL SHORTNESS OF BREATH *UNUSUAL BRUISING OR BLEEDING *URINARY PROBLEMS (pain or burning when urinating, or frequent urination) *BOWEL PROBLEMS (unusual diarrhea, constipation, pain near the anus) TENDERNESS IN MOUTH AND THROAT WITH OR WITHOUT PRESENCE OF ULCERS (sore throat, sores in mouth, or a toothache) UNUSUAL RASH, SWELLING OR PAIN  UNUSUAL VAGINAL DISCHARGE OR ITCHING   Items with * indicate a potential emergency and should be followed up as soon as possible or go to the Emergency Department if any problems should occur.  Please show the CHEMOTHERAPY ALERT CARD or IMMUNOTHERAPY ALERT  CARD at check-in to the Emergency Department and triage nurse.  Should you have questions after your visit or need to cancel or reschedule your appointment, please contact [facility]  Dept: [phone]  and follow the prompts.  Office hours are 8:00 a.m. to 4:30 p.m. Monday - Friday. Please note that voicemails left after 4:00 p.m. may not be returned until the following business day.  We are closed weekends and major holidays. You have access to a nurse at all times for urgent questions. Please call the main number to the clinic Dept: [phone] and follow the prompts.   For any non-urgent questions, you may also contact your provider using MyChart. We now offer e-Visits for anyone 70 and older to request care online for non-urgent symptoms. For details visit [email or web address].   Also download the MyChart app! Go to the app store, search "MyChart", open the app, select [facility], and log in with your MyChart username and password.  Masks are optional in the cancer centers. If you would like for your care team to wear a mask while they are taking care of you, please let them know. For doctor visits, patients may have with them one support person who is at least 65 years old. At this time, visitors are not allowed in the infusion area.

## 2022-03-23 NOTE — Progress Notes (Signed)
Patient is being treat for dental infection by dentist. Will hold zometa today

## 2022-03-26 ENCOUNTER — Encounter: Payer: Self-pay | Admitting: Hematology and Oncology

## 2022-03-26 ENCOUNTER — Telehealth: Payer: Self-pay | Admitting: Hematology and Oncology

## 2022-03-26 MED ORDER — XTAMPZA ER 13.5 MG PO C12A: 1.0000 | EXTENDED_RELEASE_CAPSULE | Freq: Two times a day (BID) | ORAL | 0 refills | Status: DC

## 2022-03-26 MED ORDER — OXYCODONE HCL 5 MG PO TABS
ORAL_TABLET | ORAL | 0 refills | Status: DC
Start: 2022-03-26 — End: 2022-05-12

## 2022-03-26 NOTE — Progress Notes (Signed)
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 CT of the head showed showed concerning for metastatic disease involving the left cerebellum and right occipital lobe.  2) 09/30/2019: MRI brain confirms mass within the left cerebellum measures 1.6 x 1.3 x 1.5 cm as well as at least 8 metastatic lesions within the supratentorial and infratentorial brain as outlined 3) 10/01/2019: PET CT scan revealed hypermetabolic infrahilar right lower lobe mass with hypermetabolic nodal metastases in the right hilum, mediastinum, right supraclavicular region, left axilla and lower left neck. 4) 10/08/2019: US guided biopsy of the lymph nodes confirms poorly differentiated non-small cell carcinoma. Immunohistochemistry confirms adenocarcinoma of lung primary. PD-L1 and NGS later revealed a MET Exon 14 mutation and TPS of 90%.  5) 10/12/2019: Establish care with Dr. Lorenso Courier  6) 11/04/2019: Day 1 of capmatinib 427m BID 7) 11/09/2019: presented to Rad/Onc with a DVT in LLE. Seen in symptom management clinic and started on apixaban.  8) 12/29/2019: CT C/A/P showed interval decrease in size of mass involving the superior segment of right lower lobe and reduction in mediastinal and left supraclavicular adenopathy though some small spinal lesions were noted in the interim between the two sets of scans 9) 01/25/2020: temporarily stopped capmatinib due to symptoms of nausea/fatigue. Restarted therapy on 01/30/2020 after brief chemo holiday.  10)  03/22/2020: CT C/A/P reveals a mixed picture, with new lung nodule and increased lymphadenopathy, however response in bone lesions. D/c capmatinib therapy 11) 04/08/2020: start of Carbo/Pem/Pem for 2nd line treatment. Cycle 1 Day 1   12) 04/29/2020: Cycle 2 Day 1 Carbo/Pem/Pem  13) 05/20/2020: Cycle 3 Day 1 Carbo/Pem/Pem  14) 06/09/2020: Cycle 4 Day 1 Carbo/Pem/Pem  15) 06/29/2020: Cycle 5 Day 1 maintenance Pem/Pem  16) 07/20/2020: Cycle 6 Day 1 maintenance Pem/Pem  17) 08/12/2020: Cycle 7 Day 1 maintenance Pem/Pem  18) 09/01/2020: Cycle 8 Day 1 maintenance Pem/Pem  19) 09/21/2020: Cycle 9 Day 1 maintenance Pem/Pem  20) 10/13/2020: Cycle 10 Day 1 maintenance Pem/Pem  21) 11/03/2020: Cycle 11 Day 1 maintenance Pem/Pem  22) 11/25/2020:  Cycle 12 Day 1 maintenance Pem/Pem 23) 12/15/2020:  Cycle 13 Day 1 maintenance Pem/Pem 24) 01/05/2021:  Cycle 14 Day 1 maintenance Pem/Pem 25) 01/26/2021:  Cycle 15 Day 1 maintenance Pem/Pem plus Bevacizumab.  02/17/2021: Cycle 16 Day 1 maintenance Pem/Pem plus Bevacizumab.  03/10/2021: Cycle 17 Day 1 maintenance Pem/Pem plus Bevacizumab.  03/31/2021: Cycle 18 Day 1 maintenance Pem/Pem  04/20/2021:  Cycle 19 Day 1 maintenance Pem/Pem  05/12/2021: Cycle 20 Day 1 maintenance Pem/Pem 06/01/2021: Cycle 21 Day 1 maintenance Pem/Pem 06/22/2021: Cycle 22 Day 1 maintenance Pem/Pem 07/13/2021: Cycle 23 Day 1 maintenance Pem/Pem 08/16/2021: Cycle 24 Day 1 maintenance Pem/Pem 09/08/2021: Cycle 25 Day 1 maintenance Pem/Pem plus Bevacizumab 09/28/2021: Cycle 26 Day 1 maintenance Pem/Pem plus Bevacizumab 10/20/2021: Cycle 27 Day 1 maintenance Pem/Pem plus Bevacizumab 11/09/2021: Cycle 28 Day 1 maintenance Pem/Pem  12/06/2021: Cycle 29 Day 1 maintenance Pem/Pem  12/27/2021: Cycle 30 Day 1 maintenance Pem/Pem  01/18/2022: Cycle 31 Day 1 maintenance Pem/Pem  02/07/2022: Cycle 32 Day  1 maintenance Pem/Pem  02/28/2022: Cycle 33 Day 1 maintenance Pem/Pem  03/23/2022: Cycle 34 Day 1 maintenance Pem/Pem    Interval History:  Virginia [name] 66 y.o. female with medical history significant for metastatic adenocarcinoma of the lung presents for a follow up visit. The patient's last visit was on 02/28/2022.  In the interim since the last visit, she continues on maintenance Pem/Pem.   On exam today Virginia [name] is accompanied by her husband.  She reports her #1 issue continues to be dizziness.  She reports that she recently had some issues with infected gums for which she is taken amoxicillin.  She is not having any bleeding or oozing but some soreness when she bites down.  She reports that her appetite has been quite good.  Her back pain has been under control with her Xtampza and oxycodone so she is taking about 1 or 2 of per day.  She is not having any trouble with nausea, ming, or diarrhea.  She continues to have a bowel movement daily.  She and her husband currently have a trip planned for [place] in November 2023.  She has no other complaints. Rest of the 10 point ROS is below.  Overall she is willing and able to proceed with treatment at this time.  MEDICAL HISTORY:  Past Medical History:  Diagnosis Date   Anemia    Anxiety    Concussion 09/28/2019   DVT (deep venous thrombosis) ([place]) 2021   L leg   Dyspnea    GERD (gastroesophageal reflux disease)    Hypercholesterolemia    per pt, she does not have elevated lipids   Hypertension    met lung ca dx'd 09/2019   mets to spine, hip and brain   PONV (postoperative nausea and vomiting)    Tobacco abuse     SURGICAL HISTORY: Past Surgical History:  Procedure Laterality Date   ABDOMINAL HYSTERECTOMY     partial/ left ovaries   APPLICATION OF CRANIAL NAVIGATION N/A 12/02/2020   Procedure: APPLICATION OF CRANIAL NAVIGATION;  Surgeon: Erline Levine, MD;  Location: [facility];  Service: Neurosurgery;  Laterality: N/A;   CHOLECYSTECTOMY     CRANIOTOMY N/A 12/02/2020   Procedure: Posterior fossa craniotomy for tumor resection with brainlab;  Surgeon:  Erline Levine, MD;  Location: [facility];  Service: Neurosurgery;  Laterality: N/A;   DILATION AND CURETTAGE OF UTERUS     IR IMAGING GUIDED PORT INSERTION  10/23/2019   IR PATIENT EVAL TECH 0-60 MINS  11/21/2021   IR PATIENT EVAL TECH 0-60 MINS  11/24/2021   IR PATIENT EVAL TECH 0-60 MINS  11/29/2021   IR PATIENT EVAL TECH 0-60 MINS  12/04/2021   IR PATIENT EVAL TECH 0-60 MINS  12/12/2021   IR PATIENT EVAL TECH 0-60 MINS  12/19/2021   IR PATIENT EVAL TECH 0-60 MINS  12/27/2021   IR PATIENT EVAL TECH 0-60 MINS  01/03/2022   IR PATIENT EVAL TECH 0-60 MINS  01/10/2022   IR PATIENT EVAL TECH 0-60 MINS  01/18/2022   IR PATIENT EVAL TECH 0-60 MINS  02/13/2022   IR PATIENT EVAL TECH 0-60 MINS  02/20/2022   IR RADIOLOGIST EVAL & MGMT  01/23/2022   IR REMOVAL TUN ACCESS W/ PORT W/O FL MOD SED  11/17/2021   KYPHOPLASTY N/A 03/15/2020   Procedure: Thoracic Eight KYPHOPLASTY;  Surgeon: Erline Levine, MD;  Location: [facility];  Service: Neurosurgery;  Laterality: N/A;  prone    LIPOMA EXCISION  2018  removed under left breast and right thigh.   TUBAL LIGATION      ALLERGIES:  has No Known Allergies.  MEDICATIONS:  Current Outpatient Medications  Medication Sig Dispense Refill   acetaminophen (TYLENOL) 500 MG tablet Take 500 mg by mouth 2 (two) times daily as needed for moderate pain.     albuterol (VENTOLIN HFA) 108 (90 Base) MCG/ACT inhaler INHALE 2 PUFFS INTO THE LUNGS EVERY 6 HOURS AS NEEDED FOR WHEEZING OR SHORTNESS OF BREATH 6.7 g 11   ALPRAZolam (XANAX) 0.25 MG tablet Take 1 tablet (0.25 mg total) by mouth 2 (two) times daily as needed for anxiety 60 tablet 1   amLODipine (NORVASC) 10 MG tablet Take 10 mg by mouth daily.     Cyanocobalamin (VITAMIN B12 PO) Take 1 tablet by mouth daily. Gummies     EPINEPHrine 0.3 mg/0.3 mL IJ SOAJ injection Inject 0.3 mg into the muscle as needed for anaphylaxis. 2 each 0   folic acid (FOLVITE) 1 MG tablet Take 1 tablet by mouth daily. 90 tablet 1   furosemide (LASIX) 20 MG  tablet Take 2 tablets (40 mg total) by mouth 2 times daily. 360 tablet 0   Glucosamine HCl (GLUCOSAMINE PO) Take 2 tablets by mouth daily.     lidocaine-prilocaine (EMLA) cream Apply to port as needed. 30 g 0   LYSINE PO Take 1 tablet by mouth daily.     meclizine (ANTIVERT) 25 MG tablet Take 1 tablet (25 mg total) by mouth 3 (three) times daily as needed for dizziness. (Patient not taking: Reported on 02/09/2022) 30 tablet 0   Multiple Vitamin (MULTIVITAMIN WITH MINERALS) TABS tablet Take 1 tablet by mouth daily.     Multiple Vitamins-Minerals (AIRBORNE PO) Take 1 tablet by mouth daily.      OLANZapine (ZYPREXA) 2.5 MG tablet TAKE 1 TABLET BY MOUTH AT BEDTIME 90 tablet 1   ondansetron (ZOFRAN) 4 MG tablet Take 1 tablet (4 mg total) by mouth every 8 (eight) hours as needed for nausea or vomiting. 40 tablet 2   OVER THE COUNTER MEDICATION Take 2 tablets by mouth daily. Beet root     oxyCODONE (OXY IR/ROXICODONE) 5 MG immediate release tablet TAKE 1 TO 2 TABLETS BY MOUTH EVERY 4 HOURS AS NEEDED FOR SEVERE PAIN 90 tablet 0   pantoprazole (PROTONIX) 40 MG tablet TAKE 1 TABLET BY MOUTH ONCE DAILY 90 tablet 3   polyethylene glycol (MIRALAX / GLYCOLAX) 17 g packet Take 17 g by mouth every evening.     potassium chloride SA (KLOR-CON M) 20 MEQ tablet Take 1 tablet (20 mEq total) by mouth 2 (two) times daily. 90 tablet 3   Tetrahydrozoline HCl (VISINE OP) Place 1 drop into both eyes daily as needed (irritation).     VITAMIN D PO Take 1 capsule by mouth daily.     vitamin E 180 MG (400 UNITS) capsule Take 400 IU daily x 1 week, then 400 IU BID (Patient taking differently: Take 400 Units by mouth daily.) 60 capsule 4   XTAMPZA ER 13.5 MG C12A Take 1 capsule by mouth in the morning and at bedtime. (every 12 hours) 60 capsule 0   No current facility-administered medications for this visit.    REVIEW OF SYSTEMS:   Constitutional: ( - ) fevers, ( - )  chills , ( - ) night sweats Eyes: ( - ) blurriness of  vision, ( - ) double vision, ( - ) watery eyes Ears, nose, mouth, throat, and face: ( - )  mucositis, ( - ) sore throat Respiratory: ( - ) cough, ( + ) dyspnea, ( - ) wheezes Cardiovascular: ( - ) palpitation, ( - ) chest discomfort, ( - ) lower extremity swelling Gastrointestinal:  ( + ) nausea, ( - ) heartburn, ( - ) change in bowel habits Skin: ( - ) abnormal skin rashes Lymphatics: ( - ) new lymphadenopathy, ( - ) easy bruising Neurological: ( - ) numbness, ( - ) tingling, ( - ) new weaknesses Behavioral/Psych: ( - ) mood change, ( - ) new changes  All other systems were reviewed with the patient and are negative.  PHYSICAL EXAMINATION: ECOG PERFORMANCE STATUS: 1 - Symptomatic but completely ambulatory  Vitals:   03/23/22 1130  BP: (!) 141/85  Pulse: 95  Resp: 16  Temp: (!) 97.5 F (36.4 C)  SpO2: 94%     Filed Weights   03/23/22 1130  Weight: 151 lb 4.8 oz (68.6 kg)   GENERAL: well appearing middle aged Caucasian female in NAD  SKIN: skin color, texture, turgor are normal, no rashes or significant lesions EYES: conjunctiva are pink and non-injected, sclera clear LUNGS: wheezing at lung bases. clear to auscultation and percussion with normal breathing effort HEART: regular rate & rhythm and no murmurs and bilateral trace pitting lower extremity edema Musculoskeletal: no cyanosis of digits and no clubbing PSYCH: alert & oriented x 3, fluent speech NEURO: no focal motor/sensory deficits  LABORATORY DATA:  I have reviewed the data as listed    Latest Ref Rng & Units 03/23/2022   10:26 AM 02/28/2022   11:11 AM 02/13/2022   12:40 PM  CBC  WBC 4.0 - 10.5 K/uL 6.7  6.9  4.9   Hemoglobin 12.0 - 15.0 g/dL 13.9  12.6  13.1   Hematocrit 36.0 - 46.0 % 38.6  36.5  37.2   Platelets 150 - 400 K/uL 382  396  216        Latest Ref Rng & Units 03/23/2022   10:26 AM 02/28/2022   11:11 AM 02/07/2022   11:08 AM  CMP  Glucose 70 - 99 mg/dL 135  108  116   BUN 8 - 23 mg/dL _0 Creatinine 0.44 - 1.00 mg/dL 0.80  0.88  0.65   Sodium 135 - 145 mmol/L 138  139  139   Potassium 3.5 - 5.1 mmol/L 2.9  3.2  3.1   Chloride 98 - 111 mmol/L 98  98  100   CO2 22 - 32 mmol/L 30  33  30   Calcium 8.9 - 10.3 mg/dL 9.4  9.9  9.7   Total Protein 6.5 - 8.1 g/dL 7.6  7.6  7.0   Total Bilirubin 0.3 - 1.2 mg/dL 0.5  0.3  0.3   Alkaline Phos 38 - 126 U/L 79  73  58   AST 15 - 41 U/L _1 ALT 0 - 44 U/L _2 RADIOGRAPHIC STUDIES: I personally have viewed the radiographic studies below: No results found.   ASSESSMENT & PLAN Virginia [name] 66 y.o. female with medical history significant for metastatic adenocarcinoma of the lung presents for a follow up visit. 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   Previously we discussed Carboplatin/Pembrolizumab/Pemetrexed and the expected side effect from these medications.  We discussed the possible side effects of immunotherapy  including colitis, hepatitis, dermatitis, and pneumonitis.  Additionally we discussed the side effects of carboplatin which include suppression of blood counts, nephrotoxicity, and nausea.  Additionally we discussed pemetrexed which can also have effects on counts and would require supplementation with vitamin K48 and folic acid.  She voiced her understanding of these side effects and treatment plans moving forward.  I also noted that we would be able to drop the chemotherapy agents that she had intolerance to continue with pembrolizumab alone given her high TPS score.  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 Due to worsening dizziness, she resumed this from Cycle 25-27.  --repeat CT CAP on 12/11/2021 showed stable disease with no evidence of  progression. Next due in July 2023.  PLAN: --Labs from today were reviewed and adequate for treatment.  Labs today show creatinine 0.8, white blood cell count 6.7, hemoglobin 13.9, and platelets of 382 --Continue with Cycle 34, Day 1 of maintenance Pem/Pem. --RTC q 3 weeks for labs, follow up visit with Dr. Lorenso Courier prior to Cycle 35.    #Nausea/Vomiting, improving -- provided patient with Zofran 4-38m q8h PRN and compazine [id number] for breakthrough PRN.  She is currently using Phenergan for breakthrough. --continue olanzpaine 2.5 mg PO QHS to help with nausea. Marked improvement since the addition of this medication on 01/29/2020.  --continue to monitor   #Hypokalemia, stable --likely 2/2 to decreased PO intake, hypovolemia, and BP medications --Potassium level 2.9 today.  Gave 40 mEq of oral potassium chloride with treatment today.  --Currently on potassium chloride tablets, 40 mEq PO potassium in AM and 2074min PM. Due to difficulty swallowing tablets, we will try granules. Sent new prescription today.   #Pain Control, stable --continue oxycodone 5mg11mH PRN. Can increase dose to 10mg25m if pain is severe. She is taking approximately 2-3 pills per day.  --continue Xtampza 13.5 mg ER BID for long acting support.  --continue to take with Senokot to prevent opioid induced constipation. 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 Last 01/18/2022, next due in August 2023.  --nausea as above    #Brain Metastasis, stable --appreciate the assistance of Dr. VasloMickeal Skinnerreating her brain metastasis and symptoms. --radiation therapy to the brain completed on 11/16/2019.  --MRI brain on 04/29/2020 showed evidence of small brain bleed. Eliquis therapy was discontinued.  --MRI on 11/04/2020 showed concern for progression of intracranial disease.  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  --MRI on 12/23/2020 showed evidence of radiation necrosis. Dr. VasloMickeal Skinneruated the patient on 01/02/2021 with recommendations to add IV Bevacizumab 10 mg/kg q 3 weeks ( started 01/26/2021). Completed a full course of this therapy. --Patient was evaluated by Dr. VasloMickeal Skinner2/20/2022 and recommended to resume Bevacizumab due to worsening dizziness.  Which was given from Cycle 25-27.  Plan: --continue to follow with Dr. VasloMickeal Skinnerext brain MRI 04/26/2022.   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    # Hypertension: --BP at today's visit was 141/85. Continue Norvasc to 10 mg daily.  --continue  to monitor at home and in clinic.    Orders Placed This Encounter  Procedures   CT CHEST ABDOMEN PELVIS W CONTRAST    Standing Status:   Future    Standing Expiration Date:   03/24/2023    Order Specific Question:   Preferred imaging location?    Answer:   [facility]    Order Specific Question:   Is Oral Contrast requested for this exam?    Answer:   Yes, Per Radiology protocol    All questions were answered. The patient knows to call the clinic with any problems, questions or concerns.  I have spent a total of 30 minutes minutes of face-to-face and non-face-to-face time, preparing to see the patient, performing a medically appropriate examination, counseling and educating the patient,  documenting clinical information in the electronic health record, and care coordination.   Ledell Peoples, MD Department of Hematology/Oncology [place] [facility] Phone: [phone] Pager: [phone] Email: [email or web address]   03/26/2022 1:07 PM   Literature Support:  Lorenza Chick, Rodrguez-Abreu D, Duffy Rhody, Felip E, De  Angelis F, Domine M, Zaleski, Hochmair MJ, [place], [place], Bischoff HG, [place], Grossi F, [place], Reck M, [place], [place], [place], Rubio-Viqueira B, Novello S, Kurata T, Gray JE, Vida J, Wei Z, Yang J, Raftopoulos H, [place], [employer]; [id number] Investigators. Pembrolizumab plus Chemotherapy in Metastatic Non-Small-Cell Lung Cancer. [place] [place] Med. 2018 May 31;378(22):2078-2092.  --Median progression-free survival was 8.8 months (95% CI, 7.6 to 9.2) in the pembrolizumab-combination group and 4.9 months (95% CI, 4.7 to 5.5) in the placebo-combination group (hazard ratio for disease progression or death, 0.52; 95% CI, 0.43 to 0.64; P<0.001). Adverse events of grade 3 or higher occurred in 67.2% of the patients in the pembrolizumab-combination group and in 65.8% of those in the placebo-combination group.  Jodene Nam L. Efficacy of pemetrexed-based regimens in advanced non-small cell lung cancer patients with activating epidermal growth factor receptor mutations after tyrosine kinase inhibitor failure: a systematic review. Onco Targets Ther. 2018;11:2121-2129.   --The weighted median PFS, median OS, and ORR for patients treated with pem regimens were 5.09 months, 15.91 months, and 30.19%, respectively. Our systematic review results showed a favorable efficacy profile of pem regimens in NSCLC patients with EGFR mutation after EGFR-TKI failure.

## 2022-03-26 NOTE — Telephone Encounter (Signed)
Scheduled follow-up appointments per 7/14 los. Patient's husband is aware.

## 2022-03-27 ENCOUNTER — Ambulatory Visit (HOSPITAL_COMMUNITY)
Admission: RE | Admit: 2022-03-27 | Discharge: 2022-03-27 | Disposition: A | Discharge status: Home / Self Care | Payer: BC Managed Care – PPO | Source: Ambulatory Visit | Attending: Hematology and Oncology | Admitting: Hematology and Oncology

## 2022-03-27 DIAGNOSIS — K838 Other specified diseases of biliary tract: Secondary | ICD-10-CM | POA: Diagnosis not present

## 2022-03-27 DIAGNOSIS — C7951 Secondary malignant neoplasm of bone: Secondary | ICD-10-CM | POA: Insufficient documentation

## 2022-03-27 DIAGNOSIS — I2510 Atherosclerotic heart disease of native coronary artery without angina pectoris: Secondary | ICD-10-CM | POA: Diagnosis not present

## 2022-03-27 DIAGNOSIS — C3490 Malignant neoplasm of unspecified part of unspecified bronchus or lung: Secondary | ICD-10-CM | POA: Insufficient documentation

## 2022-03-27 DIAGNOSIS — R918 Other nonspecific abnormal finding of lung field: Secondary | ICD-10-CM | POA: Diagnosis not present

## 2022-03-27 DIAGNOSIS — I700 Atherosclerosis of aorta: Secondary | ICD-10-CM | POA: Diagnosis not present

## 2022-03-27 DIAGNOSIS — J984 Other disorders of lung: Secondary | ICD-10-CM | POA: Diagnosis not present

## 2022-03-27 MED ORDER — IOHEXOL 300 MG/ML  SOLN
100.0000 mL | Freq: Once | INTRAMUSCULAR | Status: AC | PRN
  Administered 2022-03-27: 100 mL via INTRAVENOUS

## 2022-03-29 ENCOUNTER — Telehealth: Payer: Self-pay | Admitting: *Deleted

## 2022-03-29 NOTE — Telephone Encounter (Signed)
-----   Message from Orson Slick, MD sent at 03/29/2022  8:55 AM EDT ----- Please let Virginia [name] know her scan shows no evidence of progression or new sites of disease. Exam remains stable. We will continue treatment as scheduled.  ----- Message ----- From: Interface, Rad Results In Sent: 03/28/2022   5:38 PM EDT To: Orson Slick, MD

## 2022-03-29 NOTE — Telephone Encounter (Signed)
TCT patient's husband, [name]. regarding results. Spoke with [name] and advised that her Ct scans of chest, abdomen and pelvis show no progression of disease and no new sites of disease. [name] very happy about this. They are planning a trip to [place] in November. Reviewed her upcoming to make sure it accomodates her trip.  Schedule appears correct. She will miss one treatment but Dr. Lorenso Courier is ok with that. No other questions or concerns.

## 2022-04-02 ENCOUNTER — Other Ambulatory Visit (HOSPITAL_COMMUNITY): Payer: Self-pay

## 2022-04-13 ENCOUNTER — Other Ambulatory Visit: Payer: Self-pay

## 2022-04-13 ENCOUNTER — Inpatient Hospital Stay: Payer: BC Managed Care – PPO

## 2022-04-13 ENCOUNTER — Inpatient Hospital Stay: Payer: BC Managed Care – PPO | Attending: Hematology and Oncology | Admitting: Hematology and Oncology

## 2022-04-13 VITALS — BP 136/99 | HR 94 | Temp 97.2°F | Resp 18 | Wt 151.0 lb

## 2022-04-13 VITALS — BP 117/99

## 2022-04-13 DIAGNOSIS — R0600 Dyspnea, unspecified: Secondary | ICD-10-CM | POA: Insufficient documentation

## 2022-04-13 DIAGNOSIS — Z95828 Presence of other vascular implants and grafts: Secondary | ICD-10-CM

## 2022-04-13 DIAGNOSIS — M7989 Other specified soft tissue disorders: Secondary | ICD-10-CM | POA: Insufficient documentation

## 2022-04-13 DIAGNOSIS — Z86718 Personal history of other venous thrombosis and embolism: Secondary | ICD-10-CM | POA: Diagnosis not present

## 2022-04-13 DIAGNOSIS — C3490 Malignant neoplasm of unspecified part of unspecified bronchus or lung: Secondary | ICD-10-CM | POA: Diagnosis not present

## 2022-04-13 DIAGNOSIS — C3431 Malignant neoplasm of lower lobe, right bronchus or lung: Secondary | ICD-10-CM | POA: Insufficient documentation

## 2022-04-13 DIAGNOSIS — Z9049 Acquired absence of other specified parts of digestive tract: Secondary | ICD-10-CM | POA: Diagnosis not present

## 2022-04-13 DIAGNOSIS — C7931 Secondary malignant neoplasm of brain: Secondary | ICD-10-CM

## 2022-04-13 DIAGNOSIS — E876 Hypokalemia: Secondary | ICD-10-CM | POA: Insufficient documentation

## 2022-04-13 DIAGNOSIS — Z8249 Family history of ischemic heart disease and other diseases of the circulatory system: Secondary | ICD-10-CM | POA: Insufficient documentation

## 2022-04-13 DIAGNOSIS — Z923 Personal history of irradiation: Secondary | ICD-10-CM | POA: Diagnosis not present

## 2022-04-13 DIAGNOSIS — K838 Other specified diseases of biliary tract: Secondary | ICD-10-CM | POA: Insufficient documentation

## 2022-04-13 DIAGNOSIS — Z5111 Encounter for antineoplastic chemotherapy: Secondary | ICD-10-CM | POA: Insufficient documentation

## 2022-04-13 DIAGNOSIS — Z79899 Other long term (current) drug therapy: Secondary | ICD-10-CM | POA: Diagnosis not present

## 2022-04-13 DIAGNOSIS — Z5112 Encounter for antineoplastic immunotherapy: Secondary | ICD-10-CM | POA: Insufficient documentation

## 2022-04-13 DIAGNOSIS — R918 Other nonspecific abnormal finding of lung field: Secondary | ICD-10-CM | POA: Insufficient documentation

## 2022-04-13 DIAGNOSIS — R42 Dizziness and giddiness: Secondary | ICD-10-CM | POA: Diagnosis not present

## 2022-04-13 DIAGNOSIS — I700 Atherosclerosis of aorta: Secondary | ICD-10-CM | POA: Diagnosis not present

## 2022-04-13 DIAGNOSIS — Z87891 Personal history of nicotine dependence: Secondary | ICD-10-CM | POA: Insufficient documentation

## 2022-04-13 DIAGNOSIS — Z811 Family history of alcohol abuse and dependence: Secondary | ICD-10-CM | POA: Insufficient documentation

## 2022-04-13 DIAGNOSIS — Z8673 Personal history of transient ischemic attack (TIA), and cerebral infarction without residual deficits: Secondary | ICD-10-CM | POA: Diagnosis not present

## 2022-04-13 DIAGNOSIS — C7951 Secondary malignant neoplasm of bone: Secondary | ICD-10-CM

## 2022-04-13 DIAGNOSIS — R112 Nausea with vomiting, unspecified: Secondary | ICD-10-CM | POA: Diagnosis not present

## 2022-04-13 DIAGNOSIS — Z7901 Long term (current) use of anticoagulants: Secondary | ICD-10-CM | POA: Diagnosis not present

## 2022-04-13 DIAGNOSIS — R55 Syncope and collapse: Secondary | ICD-10-CM | POA: Insufficient documentation

## 2022-04-13 DIAGNOSIS — R609 Edema, unspecified: Secondary | ICD-10-CM | POA: Insufficient documentation

## 2022-04-13 DIAGNOSIS — I10 Essential (primary) hypertension: Secondary | ICD-10-CM | POA: Insufficient documentation

## 2022-04-13 DIAGNOSIS — Z809 Family history of malignant neoplasm, unspecified: Secondary | ICD-10-CM | POA: Insufficient documentation

## 2022-04-13 LAB — CBC WITH DIFFERENTIAL (CANCER CENTER ONLY)
Abs Immature Granulocytes: 0.02 10*3/uL (ref 0.00–0.07)
Basophils Absolute: 0 10*3/uL (ref 0.0–0.1)
Basophils Relative: 1 %
Eosinophils Absolute: 0.1 10*3/uL (ref 0.0–0.5)
Eosinophils Relative: 2 %
HCT: 37.4 % (ref 36.0–46.0)
Hemoglobin: 13.4 g/dL (ref 12.0–15.0)
Immature Granulocytes: 0 %
Lymphocytes Relative: 31 %
Lymphs Abs: 2 10*3/uL (ref 0.7–4.0)
MCH: 33.9 pg (ref 26.0–34.0)
MCHC: 35.8 g/dL (ref 30.0–36.0)
MCV: 94.7 fL (ref 80.0–100.0)
Monocytes Absolute: 1.1 10*3/uL — ABNORMAL HIGH (ref 0.1–1.0)
Monocytes Relative: 16 %
Neutro Abs: 3.3 10*3/uL (ref 1.7–7.7)
Neutrophils Relative %: 50 %
Platelet Count: 414 10*3/uL — ABNORMAL HIGH (ref 150–400)
RBC: 3.95 MIL/uL (ref 3.87–5.11)
RDW: 14.3 % (ref 11.5–15.5)
WBC Count: 6.6 10*3/uL (ref 4.0–10.5)
nRBC: 0 % (ref 0.0–0.2)

## 2022-04-13 LAB — CMP (CANCER CENTER ONLY)
ALT: 19 U/L (ref 0–44)
AST: 21 U/L (ref 15–41)
Albumin: 3.7 g/dL (ref 3.5–5.0)
Alkaline Phosphatase: 65 U/L (ref 38–126)
Anion gap: 8 (ref 5–15)
BUN: 12 mg/dL (ref 8–23)
CO2: 31 mmol/L (ref 22–32)
Calcium: 8.9 mg/dL (ref 8.9–10.3)
Chloride: 99 mmol/L (ref 98–111)
Creatinine: 0.79 mg/dL (ref 0.44–1.00)
GFR, Estimated: >60 mL/min (ref >60)
Glucose, Bld: 134 mg/dL — ABNORMAL HIGH (ref 70–99)
Potassium: 3.2 mmol/L — ABNORMAL LOW (ref 3.5–5.1)
Sodium: 138 mmol/L (ref 135–145)
Total Bilirubin: 0.3 mg/dL (ref 0.3–1.2)
Total Protein: 7.4 g/dL (ref 6.5–8.1)

## 2022-04-13 MED ORDER — SODIUM CHLORIDE 0.9 % IV SOLN
500.0000 mg/m2 | Freq: Once | INTRAVENOUS | Status: AC
  Administered 2022-04-13: 900 mg via INTRAVENOUS
  Filled 2022-04-13: qty 20

## 2022-04-13 MED ORDER — CYANOCOBALAMIN 1000 MCG/ML IJ SOLN
1000.0000 ug | Freq: Once | INTRAMUSCULAR | Status: AC
  Administered 2022-04-13: 1000 ug via INTRAMUSCULAR
  Filled 2022-04-13: qty 1

## 2022-04-13 MED ORDER — SODIUM CHLORIDE 0.9 % IV SOLN
200.0000 mg | Freq: Once | INTRAVENOUS | Status: AC
  Administered 2022-04-13: 200 mg via INTRAVENOUS
  Filled 2022-04-13: qty 200

## 2022-04-13 MED ORDER — PROCHLORPERAZINE MALEATE 10 MG PO TABS
10.0000 mg | ORAL_TABLET | Freq: Once | ORAL | Status: AC
  Administered 2022-04-13: 10 mg via ORAL
  Filled 2022-04-13: qty 1

## 2022-04-13 MED ORDER — SODIUM CHLORIDE 0.9 % IV SOLN: Freq: Once | INTRAVENOUS | Status: AC

## 2022-04-13 MED ORDER — POTASSIUM CHLORIDE CRYS ER 20 MEQ PO TBCR: 40.0000 meq | EXTENDED_RELEASE_TABLET | Freq: Two times a day (BID) | ORAL | 1 refills | Status: DC

## 2022-04-13 NOTE — Progress Notes (Signed)
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 CT of the head showed showed concerning for metastatic disease involving the left cerebellum and right occipital lobe.  2) 09/30/2019: MRI brain confirms mass within the left cerebellum measures 1.6 x 1.3 x 1.5 cm as well as at least 8 metastatic lesions within the supratentorial and infratentorial brain as outlined 3) 10/01/2019: PET CT scan revealed hypermetabolic infrahilar right lower lobe mass with hypermetabolic nodal metastases in the right hilum, mediastinum, right supraclavicular region, left axilla and lower left neck. 4) 10/08/2019: US guided biopsy of the lymph nodes confirms poorly differentiated non-small cell carcinoma. Immunohistochemistry confirms adenocarcinoma of lung primary. PD-L1 and NGS later revealed a MET Exon 14 mutation and TPS of 90%.  5) 10/12/2019: Establish care with Dr. Lorenso [name]  6) 11/04/2019: Day 1 of capmatinib 427m BID 7) 11/09/2019: presented to Rad/Onc with a DVT in LLE. Seen in symptom management clinic and started on apixaban.  8) 12/29/2019: CT C/A/P showed interval decrease in size of mass involving the superior segment of right lower lobe and reduction in mediastinal and left supraclavicular adenopathy though some small spinal lesions were noted in the interim between the two sets of scans 9) 01/25/2020: temporarily stopped capmatinib due to symptoms of nausea/fatigue. Restarted therapy on 01/30/2020 after brief chemo holiday.  10)  03/22/2020: CT C/A/P reveals a mixed picture, with new lung nodule and increased lymphadenopathy, however response in bone lesions. D/c capmatinib therapy 11) 04/08/2020: start of Carbo/Pem/Pem for 2nd line treatment. Cycle 1 Day 1   12) 04/29/2020: Cycle 2 Day 1 Carbo/Pem/Pem  13) 05/20/2020: Cycle 3 Day 1 Carbo/Pem/Pem  14) 06/09/2020: Cycle 4 Day 1 Carbo/Pem/Pem  15) 06/29/2020: Cycle 5 Day 1 maintenance Pem/Pem  16) 07/20/2020: Cycle 6 Day 1 maintenance Pem/Pem  17) 08/12/2020: Cycle 7 Day 1 maintenance Pem/Pem  18) 09/01/2020: Cycle 8 Day 1 maintenance Pem/Pem  19) 09/21/2020: Cycle 9 Day 1 maintenance Pem/Pem  20) 10/13/2020: Cycle 10 Day 1 maintenance Pem/Pem  21) 11/03/2020: Cycle 11 Day 1 maintenance Pem/Pem  22) 11/25/2020:  Cycle 12 Day 1 maintenance Pem/Pem 23) 12/15/2020:  Cycle 13 Day 1 maintenance Pem/Pem 24) 01/05/2021:  Cycle 14 Day 1 maintenance Pem/Pem 25) 01/26/2021:  Cycle 15 Day 1 maintenance Pem/Pem plus Bevacizumab.  02/17/2021: Cycle 16 Day 1 maintenance Pem/Pem plus Bevacizumab.  03/10/2021: Cycle 17 Day 1 maintenance Pem/Pem plus Bevacizumab.  03/31/2021: Cycle 18 Day 1 maintenance Pem/Pem  04/20/2021:  Cycle 19 Day 1 maintenance Pem/Pem  05/12/2021: Cycle 20 Day 1 maintenance Pem/Pem 06/01/2021: Cycle 21 Day 1 maintenance Pem/Pem 06/22/2021: Cycle 22 Day 1 maintenance Pem/Pem 07/13/2021: Cycle 23 Day 1 maintenance Pem/Pem 08/16/2021: Cycle 24 Day 1 maintenance Pem/Pem 09/08/2021: Cycle 25 Day 1 maintenance Pem/Pem plus Bevacizumab 09/28/2021: Cycle 26 Day 1 maintenance Pem/Pem plus Bevacizumab 10/20/2021: Cycle 27 Day 1 maintenance Pem/Pem plus Bevacizumab 11/09/2021: Cycle 28 Day 1 maintenance Pem/Pem  12/06/2021: Cycle 29 Day 1 maintenance Pem/Pem  12/27/2021: Cycle 30 Day 1 maintenance Pem/Pem  01/18/2022: Cycle 31 Day 1 maintenance Pem/Pem  02/07/2022: Cycle 32 Day  1 maintenance Pem/Pem  02/28/2022: Cycle 33 Day 1 maintenance Pem/Pem  03/23/2022: Cycle 34 Day 1 maintenance Pem/Pem   04/13/2022: Cycle 35 Day 1 maintenance Pem/Pem   Interval History:  Virginia [name] 66 y.o. female with medical history significant for metastatic adenocarcinoma of the lung presents for a follow up visit. The patient's last visit was on 03/23/2022.  In the interim since the last visit, she continues on maintenance Pem/Pem.   On exam today Virginia [name] is accompanied by her husband.  She reports other than her dizziness she has been quite well.  She notes her dizziness is about the same.  She is looking forward to an upcoming MRI and visit with Dr. Mickeal [name] to see if this can help explain the continued dizziness.  She notes that she is "been feeling bad a lot".  She feels "flulike" illness.  She notes that she has been eating well and her weight has been stable at 151 pounds.  She notes she is doing her best to eat bananas and try to keep her potassium levels up.  She notes that she does occasionally get some nausea from the dizziness.  She has not been having any difficulty with fevers, chills, sweats, vomiting, or diarrhea.  Overall she is willing and able to proceed with treatment at this time.  MEDICAL HISTORY:  Past Medical History:  Diagnosis Date   Anemia    Anxiety    Concussion 09/28/2019   DVT (deep venous thrombosis) ([place]) 2021   L leg   Dyspnea    GERD (gastroesophageal reflux disease)    Hypercholesterolemia    per pt, she does not have elevated lipids   Hypertension    met lung ca dx'd 09/2019   mets to spine, hip and brain   PONV (postoperative nausea and vomiting)    Tobacco abuse     SURGICAL HISTORY: Past Surgical History:  Procedure Laterality Date   ABDOMINAL HYSTERECTOMY     partial/ left ovaries   APPLICATION OF CRANIAL NAVIGATION N/A 12/02/2020   Procedure: APPLICATION OF CRANIAL NAVIGATION;  Surgeon: Erline Levine, MD;  Location: [facility];  Service: Neurosurgery;  Laterality: N/A;   CHOLECYSTECTOMY     CRANIOTOMY N/A 12/02/2020   Procedure: Posterior fossa  craniotomy for tumor resection with brainlab;  Surgeon: Erline Levine, MD;  Location: [facility];  Service: Neurosurgery;  Laterality: N/A;   DILATION AND CURETTAGE OF UTERUS     IR IMAGING GUIDED PORT INSERTION  10/23/2019   IR PATIENT EVAL TECH 0-60 MINS  11/21/2021   IR PATIENT EVAL TECH 0-60 MINS  11/24/2021   IR PATIENT EVAL TECH 0-60 MINS  11/29/2021   IR PATIENT EVAL TECH 0-60 MINS  12/04/2021   IR PATIENT EVAL TECH 0-60 MINS  12/12/2021   IR PATIENT EVAL TECH 0-60 MINS  12/19/2021   IR PATIENT EVAL TECH 0-60 MINS  12/27/2021   IR PATIENT EVAL TECH 0-60 MINS  01/03/2022   IR PATIENT EVAL TECH 0-60 MINS  01/10/2022   IR PATIENT EVAL TECH 0-60 MINS  01/18/2022   IR PATIENT EVAL TECH 0-60 MINS  02/13/2022   IR PATIENT EVAL TECH 0-60 MINS  02/20/2022   IR RADIOLOGIST EVAL & MGMT  01/23/2022   IR REMOVAL TUN ACCESS W/ PORT W/O FL MOD SED  11/17/2021   KYPHOPLASTY N/A 03/15/2020   Procedure: Thoracic Eight KYPHOPLASTY;  Surgeon: Erline Levine, MD;  Location: [facility];  Service: Neurosurgery;  Laterality: N/A;  prone  LIPOMA EXCISION  2018   removed under left breast and right thigh.   TUBAL LIGATION      ALLERGIES:  has No Known Allergies.  MEDICATIONS:  Current Outpatient Medications  Medication Sig Dispense Refill   acetaminophen (TYLENOL) 500 MG tablet Take 500 mg by mouth 2 (two) times daily as needed for moderate pain.     albuterol (VENTOLIN HFA) 108 (90 Base) MCG/ACT inhaler INHALE 2 PUFFS INTO THE LUNGS EVERY 6 HOURS AS NEEDED FOR WHEEZING OR SHORTNESS OF BREATH 6.7 g 11   ALPRAZolam (XANAX) 0.25 MG tablet Take 1 tablet (0.25 mg total) by mouth 2 (two) times daily as needed for anxiety 60 tablet 1   amLODipine (NORVASC) 10 MG tablet Take 10 mg by mouth daily.     Cyanocobalamin (VITAMIN B12 PO) Take 1 tablet by mouth daily. Gummies     EPINEPHrine 0.3 mg/0.3 mL IJ SOAJ injection Inject 0.3 mg into the muscle as needed for anaphylaxis. 2 each 0   folic acid (FOLVITE) 1 MG tablet Take 1 tablet by  mouth daily. 90 tablet 1   furosemide (LASIX) 20 MG tablet Take 2 tablets (40 mg total) by mouth 2 times daily. 360 tablet 0   Glucosamine HCl (GLUCOSAMINE PO) Take 2 tablets by mouth daily.     lidocaine-prilocaine (EMLA) cream Apply to port as needed. 30 g 0   LYSINE PO Take 1 tablet by mouth daily.     meclizine (ANTIVERT) 25 MG tablet Take 1 tablet (25 mg total) by mouth 3 (three) times daily as needed for dizziness. (Patient not taking: Reported on 02/09/2022) 30 tablet 0   Multiple Vitamin (MULTIVITAMIN WITH MINERALS) TABS tablet Take 1 tablet by mouth daily.     Multiple Vitamins-Minerals (AIRBORNE PO) Take 1 tablet by mouth daily.      OLANZapine (ZYPREXA) 2.5 MG tablet TAKE 1 TABLET BY MOUTH AT BEDTIME 90 tablet 1   ondansetron (ZOFRAN) 4 MG tablet Take 1 tablet (4 mg total) by mouth every 8 (eight) hours as needed for nausea or vomiting. 40 tablet 2   OVER THE COUNTER MEDICATION Take 2 tablets by mouth daily. Beet root     oxyCODONE (OXY IR/ROXICODONE) 5 MG immediate release tablet TAKE 1 TO 2 TABLETS BY MOUTH EVERY 4 HOURS AS NEEDED FOR SEVERE PAIN 90 tablet 0   pantoprazole (PROTONIX) 40 MG tablet TAKE 1 TABLET BY MOUTH ONCE DAILY 90 tablet 3   polyethylene glycol (MIRALAX / GLYCOLAX) 17 g packet Take 17 g by mouth every evening.     potassium chloride SA (KLOR-CON M) 20 MEQ tablet Take 2 tablets (40 mEq total) by mouth 2 (two) times daily. 120 tablet 1   Tetrahydrozoline HCl (VISINE OP) Place 1 drop into both eyes daily as needed (irritation).     VITAMIN D PO Take 1 capsule by mouth daily.     vitamin E 180 MG (400 UNITS) capsule Take 400 IU daily x 1 week, then 400 IU BID (Patient taking differently: Take 400 Units by mouth daily.) 60 capsule 4   XTAMPZA ER 13.5 MG C12A Take 1 capsule by mouth in the morning and at bedtime. (every 12 hours) 60 capsule 0   No current facility-administered medications for this visit.    REVIEW OF SYSTEMS:   Constitutional: ( - ) fevers, ( - )   chills , ( - ) night sweats Eyes: ( - ) blurriness of vision, ( - ) double vision, ( - ) watery eyes  Ears, nose, mouth, throat, and face: ( - ) mucositis, ( - ) sore throat Respiratory: ( - ) cough, ( + ) dyspnea, ( - ) wheezes Cardiovascular: ( - ) palpitation, ( - ) chest discomfort, ( - ) lower extremity swelling Gastrointestinal:  ( + ) nausea, ( - ) heartburn, ( - ) change in bowel habits Skin: ( - ) abnormal skin rashes Lymphatics: ( - ) new lymphadenopathy, ( - ) easy bruising Neurological: ( - ) numbness, ( - ) tingling, ( - ) new weaknesses Behavioral/Psych: ( - ) mood change, ( - ) new changes  All other systems were reviewed with the patient and are negative.  PHYSICAL EXAMINATION: ECOG PERFORMANCE STATUS: 1 - Symptomatic but completely ambulatory  Vitals:   04/13/22 1055  BP: (!) 136/99  Pulse: 94  Resp: 18  Temp: (!) 97.2 F (36.2 C)  SpO2: 99%     Filed Weights   04/13/22 1055  Weight: 151 lb (68.5 kg)   GENERAL: well appearing middle aged Caucasian female in NAD  SKIN: skin color, texture, turgor are normal, no rashes or significant lesions EYES: conjunctiva are pink and non-injected, sclera clear LUNGS: wheezing at lung bases. clear to auscultation and percussion with normal breathing effort HEART: regular rate & rhythm and no murmurs and bilateral trace pitting lower extremity edema Musculoskeletal: no cyanosis of digits and no clubbing PSYCH: alert & oriented x 3, fluent speech NEURO: no focal motor/sensory deficits  LABORATORY DATA:  I have reviewed the data as listed    Latest Ref Rng & Units 04/13/2022   10:31 AM 03/23/2022   10:26 AM 02/28/2022   11:11 AM  CBC  WBC 4.0 - 10.5 K/uL 6.6  6.7  6.9   Hemoglobin 12.0 - 15.0 g/dL 13.4  13.9  12.6   Hematocrit 36.0 - 46.0 % 37.4  38.6  36.5   Platelets 150 - 400 K/uL 414  382  396        Latest Ref Rng & Units 04/13/2022   10:31 AM 03/23/2022   10:26 AM 02/28/2022   11:11 AM  CMP  Glucose 70 - 99  mg/dL 134  135  108   BUN 8 - 23 mg/dL $Remove'12  9  11   'awkUCfY$ Creatinine 0.44 - 1.00 mg/dL 0.79  0.80  0.88   Sodium 135 - 145 mmol/L 138  138  139   Potassium 3.5 - 5.1 mmol/L 3.2  2.9  3.2   Chloride 98 - 111 mmol/L 99  98  98   CO2 22 - 32 mmol/L 31  30  33   Calcium 8.9 - 10.3 mg/dL 8.9  9.4  9.9   Total Protein 6.5 - 8.1 g/dL 7.4  7.6  7.6   Total Bilirubin 0.3 - 1.2 mg/dL 0.3  0.5  0.3   Alkaline Phos 38 - 126 U/L 65  79  73   AST 15 - 41 U/L $Remo'21  22  23   'iKCfT$ ALT 0 - 44 U/L $Remo'19  17  19      'dIabH$ RADIOGRAPHIC STUDIES: I personally have viewed the radiographic studies below: CT CHEST ABDOMEN PELVIS W CONTRAST  Result Date: 03/28/2022 CLINICAL DATA:  Restaging non-small cell lung cancer. EXAM: CT CHEST, ABDOMEN, AND PELVIS WITH CONTRAST TECHNIQUE: Multidetector CT imaging of the chest, abdomen and pelvis was performed following the standard protocol during bolus administration of intravenous contrast. RADIATION DOSE REDUCTION: This exam was performed according to the departmental dose-optimization program which  includes automated exposure control, adjustment of the mA and/or kV according to patient size and/or use of iterative reconstruction technique. CONTRAST:  154mL OMNIPAQUE IOHEXOL 300 MG/ML  SOLN COMPARISON:  Prior study 12/11/2021 FINDINGS: CT CHEST FINDINGS Cardiovascular: The heart is normal in size. No pericardial effusion. Stable age advanced atherosclerotic calcifications involving the thoracic aorta and branch vessels. No aneurysm dissection. Mediastinum/Nodes: No mediastinal or hilar mass or lymphadenopathy. The esophagus is grossly normal. Lungs/Pleura: Stable post treatment changes involving the medial aspect of the right lower lobe. No findings suspicious for residual or recurrent tumor. Stable numerous scattered sub solid nodules in both lungs. There was a new 7 mm nodule noted on the prior scan left upper lobe but this has resolved and was likely an area inflammation. I do not see any definite  new ground-glass lesions. Stable basilar scarring changes. No pleural effusions or pleural nodules. Musculoskeletal: No new bony lesions. A few small scattered sclerotic lesions consistent with treated disease. Stable T8 vertebral augmentation changes. CT ABDOMEN PELVIS FINDINGS Hepatobiliary: No hepatic lesions or intrahepatic biliary dilatation. Gallbladder is surgically absent. Stable associated mild common bile duct dilatation. Pancreas: No mass, inflammation or ductal dilatation. Spleen: Normal size.  No focal lesions. Adrenals/Urinary Tract: Adrenal glands and kidneys are unremarkable. No renal lesions or hydronephrosis. The bladder is unremarkable. Stomach/Bowel: The stomach, duodenum, small and unremarkable. No acute inflammatory process, mass lesions or obstructive findings. Terminal ileum is normal. The appendix is normal. Moderate to large amount of stool throughout the colon and down into the rectum could suggest constipation. Vascular/Lymphatic: Stable age advanced atherosclerotic calcifications involving the aorta and iliac arteries and branch vessels but no aneurysm or dissection. The major venous structures are patent. No mesenteric or retroperitoneal mass or adenopathy. Reproductive: The uterus is surgically absent. Both ovaries are still present and appear normal. Other: No pelvic mass or adenopathy. No free pelvic fluid collections. No inguinal mass or adenopathy. No abdominal wall hernia or subcutaneous lesions. Musculoskeletal: Stable scattered largely sclerotic bone lesions consistent with treated disease. IMPRESSION: 1. Stable post treatment changes involving the medial aspect of the right lower lobe without findings suspicious for residual or recurrent tumor. 2. Stable numerous scattered sub solid nodules in both lungs. No definite new ground-glass lesions. 3. Stable age advanced atherosclerotic calcifications involving the thoracic and abdominal aorta and branch vessels. 4. Stable scattered  sclerotic bone lesions consistent with treated disease. No new bony lesions. 5. Status post cholecystectomy with stable mild common bile duct dilatation. 6. Moderate to large amount of stool throughout the colon and down into the rectum could suggest constipation. Aortic Atherosclerosis (ICD10-I70.0). Electronically Signed   By: Marijo Sanes M.D.   On: 03/28/2022 17:36     ASSESSMENT & PLAN Virginia [name] 66 y.o. female with medical history significant for metastatic adenocarcinoma of the lung presents for a follow up visit. 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   Previously we discussed Carboplatin/Pembrolizumab/Pemetrexed and the expected side effect from these medications.  We discussed the possible side effects of immunotherapy including colitis, hepatitis, dermatitis, and pneumonitis.  Additionally we discussed the side effects of carboplatin which include suppression of blood counts, nephrotoxicity, and nausea.  Additionally we discussed pemetrexed which can also have effects on counts and would require supplementation with vitamin S56 and folic acid.  She voiced her understanding of these side effects and treatment plans moving forward.  I also noted that we would be able to  drop the chemotherapy agents that she had intolerance to continue with pembrolizumab alone given her high TPS score.  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 Due to worsening dizziness, she resumed this from Cycle 25-27.  --repeat CT CAP on 12/11/2021 showed stable disease with no evidence of progression. Next due in July 2023.  PLAN: --Labs from today were reviewed and adequate for treatment.  Labs today show creatinine 0.79, white blood cell 6.6, hemoglobin 13.4, MCV  94.7, and platelets of 414 --Continue with Cycle 35, Day 1 of maintenance Pem/Pem. --RTC q 3 weeks for labs, follow up visit with Dr. Lorenso [name] prior to Cycle 36.    #Nausea/Vomiting, improving -- provided patient with Zofran 4-8mg  q8h PRN and compazine 10mg  q6H for breakthrough PRN.  She is currently using Phenergan for breakthrough. --continue olanzpaine 2.5 mg PO QHS to help with nausea. Marked improvement since the addition of this medication on 01/29/2020.  --continue to monitor   #Hypokalemia, stable --likely 2/2 to decreased PO intake, hypovolemia, and BP medications --Potassium level 2.9 today.  Gave 40 mEq of oral potassium chloride with treatment today.  --Currently on potassium chloride tablets, 40 mEq PO potassium in AM and 63mEq in PM. Due to difficulty swallowing tablets, we will try granules. Sent new prescription today.   #Pain Control, stable --continue oxycodone 5mg  q4H PRN. Can increase dose to 10mg  q6H if pain is severe. She is taking approximately 2-3 pills per day.  --continue Xtampza 13.5 mg ER BID for long acting support.  --continue to take with Senokot to prevent opioid induced constipation. 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 Last 01/18/2022, next due in August 2023.  --nausea as above    #Brain Metastasis, stable --appreciate the assistance of Dr. Mickeal [name] in treating her brain metastasis and symptoms. --radiation therapy to the brain completed on 11/16/2019.  --MRI brain on 04/29/2020 showed evidence of small brain bleed. Eliquis therapy was discontinued.  --MRI on 11/04/2020 showed concern for progression of intracranial disease.  --On 12/02/2020, patient underwent craniotomy, resection of progressive cerebellar mass by Dr. Vertell Limber. 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  --MRI on 12/23/2020 showed evidence of radiation necrosis. Dr. Mickeal [name] evaluated the patient on  01/02/2021 with recommendations to add IV Bevacizumab 10 mg/kg q 3 weeks ( started 01/26/2021). Completed a full course of this therapy. --Patient was evaluated by Dr. Mickeal [name] on 08/29/2021 and recommended to resume Bevacizumab due to worsening dizziness.  Which was given from Cycle 25-27.  Plan: --continue to follow with Dr. Mickeal [name]  --next brain MRI 04/26/2022.   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    # Hypertension: --BP at today's visit was 136/99. Continue Norvasc to 10 mg daily.  --continue to monitor at home and in clinic.    No orders of the defined types were placed in this encounter.   All questions were answered. The patient knows to call the clinic with any problems, questions or concerns.  I have spent a total of 30 minutes minutes of face-to-face and non-face-to-face time, preparing to see the patient, performing a medically appropriate examination, counseling and educating the  patient,  documenting clinical information in the electronic health record, and care coordination.   Ledell Peoples, MD Department of Hematology/Oncology [facility] Phone: [phone] Pager: [phone] Email: [email or web address]   04/13/2022 5:09 PM   Literature Support:  Lorenza Chick, Rodrguez-Abreu D, Duffy Rhody, Felip E, De Angelis F, Domine M, [place], Hochmair MJ, [place], [place], Bischoff HG, [place], Grossi F, [place], Reck M, [place], [place], [place], Rubio-Viqueira B, Novello S, Kurata T, Gray JE, Vida J, Wei Z, Yang J, Raftopoulos H, [place], Lindsay [employer]; [id number] Investigators. Pembrolizumab plus Chemotherapy in Metastatic Non-Small-Cell Lung Cancer. [place] Med. 2018 May 31;378(22):2078-2092.  --Median progression-free survival was 8.8 months (95% CI, 7.6 to 9.2) in the  pembrolizumab-combination group and 4.9 months (95% CI, 4.7 to 5.5) in the placebo-combination group (hazard ratio for disease progression or death, 0.52; 95% CI, 0.43 to 0.64; P<0.001). Adverse events of grade 3 or higher occurred in 67.2% of the patients in the pembrolizumab-combination group and in 65.8% of those in the placebo-combination group.  Jodene Nam L. Efficacy of pemetrexed-based regimens in advanced non-small cell lung cancer patients with activating epidermal growth factor receptor mutations after tyrosine kinase inhibitor failure: a systematic review. Onco Targets Ther. 2018;11:2121-2129.   --The weighted median PFS, median OS, and ORR for patients treated with pem regimens were 5.09 months, 15.91 months, and 30.19%, respectively. Our systematic review results showed a favorable efficacy profile of pem regimens in NSCLC patients with EGFR mutation after EGFR-TKI failure.

## 2022-04-13 NOTE — Patient Instructions (Signed)
[facility]   Discharge Instructions: Thank you for choosing [facility] to provide your oncology and hematology care.   If you have a lab appointment with the [facility], please go directly to the [facility] and check in at the registration area.   Wear comfortable clothing and clothing appropriate for easy access to any Portacath or PICC line.   We strive to give you quality time with your provider. You may need to reschedule your appointment if you arrive late (15 or more minutes).  Arriving late affects you and other patients whose appointments are after yours.  Also, if you miss three or more appointments without notifying the office, you may be dismissed from the clinic at the provider's discretion.      For prescription refill requests, have your pharmacy contact our office and allow 72 hours for refills to be completed.    Today you received the following chemotherapy and/or immunotherapy agents: pembrolizumab and pemetrexed      To help prevent nausea and vomiting after your treatment, we encourage you to take your nausea medication as directed.  BELOW ARE SYMPTOMS THAT SHOULD BE REPORTED IMMEDIATELY: *FEVER GREATER THAN 100.4 F (38 C) OR HIGHER *CHILLS OR SWEATING *NAUSEA AND VOMITING THAT IS NOT CONTROLLED WITH YOUR NAUSEA MEDICATION *UNUSUAL SHORTNESS OF BREATH *UNUSUAL BRUISING OR BLEEDING *URINARY PROBLEMS (pain or burning when urinating, or frequent urination) *BOWEL PROBLEMS (unusual diarrhea, constipation, pain near the anus) TENDERNESS IN MOUTH AND THROAT WITH OR WITHOUT PRESENCE OF ULCERS (sore throat, sores in mouth, or a toothache) UNUSUAL RASH, SWELLING OR PAIN  UNUSUAL VAGINAL DISCHARGE OR ITCHING   Items with * indicate a potential emergency and should be followed up as soon as possible or go to the Emergency Department if any problems should occur.  Please show the CHEMOTHERAPY ALERT CARD or IMMUNOTHERAPY ALERT  CARD at check-in to the Emergency Department and triage nurse.  Should you have questions after your visit or need to cancel or reschedule your appointment, please contact [facility]  Dept: [phone]  and follow the prompts.  Office hours are 8:00 a.m. to 4:30 p.m. Monday - Friday. Please note that voicemails left after 4:00 p.m. may not be returned until the following business day.  We are closed weekends and major holidays. You have access to a nurse at all times for urgent questions. Please call the main number to the clinic Dept: [phone] and follow the prompts.   For any non-urgent questions, you may also contact your provider using MyChart. We now offer e-Visits for anyone 65 and older to request care online for non-urgent symptoms. For details visit [email or web address].   Also download the MyChart app! Go to the app store, search "MyChart", open the app, select [facility], and log in with your MyChart username and password.  Masks are optional in the cancer centers. If you would like for your care team to wear a mask while they are taking care of you, please let them know. For doctor visits, patients may have with them one support Starlette Thurow who is at least 65 years old. At this time, visitors are not allowed in the infusion area.

## 2022-04-20 ENCOUNTER — Other Ambulatory Visit: Payer: Self-pay | Admitting: Hematology and Oncology

## 2022-04-23 ENCOUNTER — Other Ambulatory Visit: Payer: Self-pay | Admitting: *Deleted

## 2022-04-23 MED ORDER — FOLIC ACID 1 MG PO TABS: ORAL_TABLET | Freq: Every day | ORAL | 1 refills | Status: DC

## 2022-04-26 ENCOUNTER — Ambulatory Visit
Admission: RE | Admit: 2022-04-26 | Discharge: 2022-04-26 | Disposition: A | Discharge status: Home / Self Care | Payer: BC Managed Care – PPO | Source: Ambulatory Visit | Attending: Internal Medicine | Admitting: Internal Medicine

## 2022-04-26 DIAGNOSIS — Y842 Radiological procedure and radiotherapy as the cause of abnormal reaction of the patient, or of later complication, without mention of misadventure at the time of the procedure: Secondary | ICD-10-CM

## 2022-04-26 DIAGNOSIS — G939 Disorder of brain, unspecified: Secondary | ICD-10-CM | POA: Diagnosis not present

## 2022-04-26 DIAGNOSIS — C7931 Secondary malignant neoplasm of brain: Secondary | ICD-10-CM | POA: Diagnosis not present

## 2022-04-26 DIAGNOSIS — J341 Cyst and mucocele of nose and nasal sinus: Secondary | ICD-10-CM | POA: Diagnosis not present

## 2022-04-26 DIAGNOSIS — C7951 Secondary malignant neoplasm of bone: Secondary | ICD-10-CM | POA: Diagnosis not present

## 2022-04-26 MED ORDER — GADOBENATE DIMEGLUMINE 529 MG/ML IV SOLN
15.0000 mL | Freq: Once | INTRAVENOUS | Status: AC | PRN
  Administered 2022-04-26: 15 mL via INTRAVENOUS

## 2022-04-30 ENCOUNTER — Inpatient Hospital Stay: Payer: BC Managed Care – PPO

## 2022-04-30 ENCOUNTER — Other Ambulatory Visit: Payer: Self-pay | Admitting: *Deleted

## 2022-04-30 ENCOUNTER — Inpatient Hospital Stay: Payer: BC Managed Care – PPO | Admitting: Internal Medicine

## 2022-04-30 VITALS — BP 110/95 | HR 100 | Temp 98.2°F | Resp 18 | Ht 62.0 in | Wt 151.6 lb

## 2022-04-30 DIAGNOSIS — Z7901 Long term (current) use of anticoagulants: Secondary | ICD-10-CM | POA: Diagnosis not present

## 2022-04-30 DIAGNOSIS — Z5111 Encounter for antineoplastic chemotherapy: Secondary | ICD-10-CM | POA: Diagnosis not present

## 2022-04-30 DIAGNOSIS — I6789 Other cerebrovascular disease: Secondary | ICD-10-CM

## 2022-04-30 DIAGNOSIS — Y842 Radiological procedure and radiotherapy as the cause of abnormal reaction of the patient, or of later complication, without mention of misadventure at the time of the procedure: Secondary | ICD-10-CM

## 2022-04-30 DIAGNOSIS — I700 Atherosclerosis of aorta: Secondary | ICD-10-CM | POA: Diagnosis not present

## 2022-04-30 DIAGNOSIS — C3431 Malignant neoplasm of lower lobe, right bronchus or lung: Secondary | ICD-10-CM | POA: Diagnosis not present

## 2022-04-30 DIAGNOSIS — C7931 Secondary malignant neoplasm of brain: Secondary | ICD-10-CM

## 2022-04-30 DIAGNOSIS — I10 Essential (primary) hypertension: Secondary | ICD-10-CM | POA: Diagnosis not present

## 2022-04-30 DIAGNOSIS — R609 Edema, unspecified: Secondary | ICD-10-CM | POA: Diagnosis not present

## 2022-04-30 DIAGNOSIS — R42 Dizziness and giddiness: Secondary | ICD-10-CM | POA: Diagnosis not present

## 2022-04-30 DIAGNOSIS — R112 Nausea with vomiting, unspecified: Secondary | ICD-10-CM | POA: Diagnosis not present

## 2022-04-30 DIAGNOSIS — R55 Syncope and collapse: Secondary | ICD-10-CM | POA: Diagnosis not present

## 2022-04-30 DIAGNOSIS — R918 Other nonspecific abnormal finding of lung field: Secondary | ICD-10-CM | POA: Diagnosis not present

## 2022-04-30 DIAGNOSIS — M7989 Other specified soft tissue disorders: Secondary | ICD-10-CM | POA: Diagnosis not present

## 2022-04-30 DIAGNOSIS — Z5112 Encounter for antineoplastic immunotherapy: Secondary | ICD-10-CM | POA: Diagnosis not present

## 2022-04-30 DIAGNOSIS — R0600 Dyspnea, unspecified: Secondary | ICD-10-CM | POA: Diagnosis not present

## 2022-04-30 DIAGNOSIS — E876 Hypokalemia: Secondary | ICD-10-CM | POA: Diagnosis not present

## 2022-04-30 DIAGNOSIS — K838 Other specified diseases of biliary tract: Secondary | ICD-10-CM | POA: Diagnosis not present

## 2022-04-30 MED ORDER — ONDANSETRON HCL 4 MG PO TABS: 4.0000 mg | ORAL_TABLET | Freq: Three times a day (TID) | ORAL | 2 refills | Status: DC | PRN

## 2022-04-30 NOTE — Telephone Encounter (Signed)
Virginia [name] needs a refill of her Xtampza. Last filled on 03/26/22 for #60. She uses [facility] on [address].

## 2022-04-30 NOTE — Progress Notes (Signed)
[facility] at [place] [address] [place], [place] [address] [phone]   Interval Evaluation  Date of Service: 04/30/22 Patient Name: Virginia [name] Patient MRN: [id number] Patient DOB: Oct 27, 1955 Provider: Ventura Sellers, MD  Identifying Statement:  Virginia [name] is a 66 y.o. 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 Isidore Moos 12/02/20: Craniotomy, resection of progressive cerebellar mass by Dr. Vertell Limber.  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 She does acknowledge some increase in overall burden of dizziness, vertigo in recent weeks/months.  Limits her ability to walk and perform activities of daily living.  Sleep burden has been very high, 16 hours.  Hasn't been active with PT exercises.  Continues on Pem/pem with Dr. DLorenso Courier  Leg swelling has improved.  No seizures or headaches.    H+P (10/15/19) Patient presents to clinic to discuss recent neurologic symptoms and imaging findings.  She describes a fall while climbing stairs on 1/20; she hit the back of her and lost consciousness.  She is unclear whether she lost her balance or had mechanical provocation, but she had been drinking that evening.  She was treated initially as a trauma, CT demonstrated a lesion in the posterior fossa, later confirmed as a metastasis by MRI, along with 7 additional smaller lesions.  It took her almost two weeks to return for cognition and balance to return to "normal" which she feels she is about at right now.  Baseline she is high functioning and independent, works as a bNeurosurgeon  Has been taking decadron 249mtwice per day.  Plans to  initiate chemotherapy soon with Dr. DoLorenso Courier Medications: Current Outpatient Medications on File Prior to Visit  Medication Sig Dispense Refill   acetaminophen (TYLENOL) 500 MG tablet Take 500 mg by mouth 2 (two) times daily as needed for moderate pain.     albuterol (VENTOLIN HFA) 108 (90 Base) MCG/ACT inhaler INHALE 2 PUFFS INTO THE LUNGS EVERY 6 HOURS AS NEEDED FOR WHEEZING OR SHORTNESS OF BREATH 6.7 g 11   ALPRAZolam (XANAX) 0.25 MG tablet Take 1 tablet (0.25 mg total) by mouth 2 (two) times daily as needed for anxiety 60 tablet 1   amLODipine (NORVASC) 10 MG tablet TAKE 1 TABLET BY MOUTH EVERY DAY 90 tablet 3   Cyanocobalamin (VITAMIN B12 PO) Take 1 tablet by mouth daily. Gummies     EPINEPHrine 0.3 mg/0.3 mL IJ SOAJ injection Inject 0.3 mg into the muscle as needed for anaphylaxis. 2 each 0   folic acid (FOLVITE) 1 MG tablet Take 1 tablet by mouth daily. 90 tablet 1   furosemide (LASIX) 20 MG tablet Take 2 tablets (40 mg total) by mouth 2 times daily. 360 tablet 0   Glucosamine HCl (GLUCOSAMINE PO) Take 2 tablets by mouth daily.     lidocaine-prilocaine (EMLA) cream Apply to port as needed. 30 g 0   LYSINE PO Take 1 tablet by mouth daily.     meclizine (ANTIVERT) 25 MG tablet Take 1 tablet (25 mg total) by mouth 3 (three) times daily as needed for dizziness. (Patient not taking: Reported on 02/09/2022) 30 tablet 0   Multiple Vitamin (MULTIVITAMIN WITH MINERALS) TABS tablet Take 1  tablet by mouth daily.     Multiple Vitamins-Minerals (AIRBORNE PO) Take 1 tablet by mouth daily.      OLANZapine (ZYPREXA) 2.5 MG tablet TAKE 1 TABLET BY MOUTH AT BEDTIME 90 tablet 1   ondansetron (ZOFRAN) 4 MG tablet Take 1 tablet (4 mg total) by mouth every 8 (eight) hours as needed for nausea or vomiting. 40 tablet 2   OVER THE COUNTER MEDICATION Take 2 tablets by mouth daily. Beet root     oxyCODONE (OXY IR/ROXICODONE) 5 MG immediate release tablet TAKE 1 TO 2 TABLETS BY MOUTH EVERY 4 HOURS AS NEEDED FOR  SEVERE PAIN 90 tablet 0   pantoprazole (PROTONIX) 40 MG tablet TAKE 1 TABLET BY MOUTH ONCE DAILY 90 tablet 3   polyethylene glycol (MIRALAX / GLYCOLAX) 17 g packet Take 17 g by mouth every evening.     potassium chloride SA (KLOR-CON M) 20 MEQ tablet Take 2 tablets (40 mEq total) by mouth 2 (two) times daily. 120 tablet 1   Tetrahydrozoline HCl (VISINE OP) Place 1 drop into both eyes daily as needed (irritation).     VITAMIN D PO Take 1 capsule by mouth daily.     vitamin E 180 MG (400 UNITS) capsule Take 400 IU daily x 1 week, then 400 IU BID (Patient taking differently: Take 400 Units by mouth daily.) 60 capsule 4   XTAMPZA ER 13.5 MG C12A Take 1 capsule by mouth in the morning and at bedtime. (every 12 hours) 60 capsule 0   No current facility-administered medications on file prior to visit.    Allergies: No Known Allergies Past Medical History:  Past Medical History:  Diagnosis Date   Anemia    Anxiety    Concussion 09/28/2019   DVT (deep venous thrombosis) ([facility]) 2021   L leg   Dyspnea    GERD (gastroesophageal reflux disease)    Hypercholesterolemia    per pt, she does not have elevated lipids   Hypertension    met lung ca dx'd 09/2019   mets to spine, hip and brain   PONV (postoperative nausea and vomiting)    Tobacco abuse    Past Surgical History:  Past Surgical History:  Procedure Laterality Date   ABDOMINAL HYSTERECTOMY     partial/ left ovaries   APPLICATION OF CRANIAL NAVIGATION N/A 12/02/2020   Procedure: APPLICATION OF CRANIAL NAVIGATION;  Surgeon: Erline Levine, MD;  Location: [facility];  Service: Neurosurgery;  Laterality: N/A;   CHOLECYSTECTOMY     CRANIOTOMY N/A 12/02/2020   Procedure: Posterior fossa craniotomy for tumor resection with brainlab;  Surgeon: Erline Levine, MD;  Location: [facility];  Service: Neurosurgery;  Laterality: N/A;   DILATION AND CURETTAGE OF UTERUS     IR IMAGING GUIDED PORT INSERTION  10/23/2019   IR PATIENT EVAL TECH 0-60 MINS  11/21/2021    IR PATIENT EVAL TECH 0-60 MINS  11/24/2021   IR PATIENT EVAL TECH 0-60 MINS  11/29/2021   IR PATIENT EVAL TECH 0-60 MINS  12/04/2021   IR PATIENT EVAL TECH 0-60 MINS  12/12/2021   IR PATIENT EVAL TECH 0-60 MINS  12/19/2021   IR PATIENT EVAL TECH 0-60 MINS  12/27/2021   IR PATIENT EVAL TECH 0-60 MINS  01/03/2022   IR PATIENT EVAL TECH 0-60 MINS  01/10/2022   IR PATIENT EVAL TECH 0-60 MINS  01/18/2022   IR PATIENT EVAL TECH 0-60 MINS  02/13/2022   IR PATIENT EVAL TECH 0-60 MINS  02/20/2022   IR  RADIOLOGIST EVAL & MGMT  01/23/2022   IR REMOVAL TUN ACCESS W/ PORT W/O FL MOD SED  11/17/2021   KYPHOPLASTY N/A 03/15/2020   Procedure: Thoracic Eight KYPHOPLASTY;  Surgeon: Erline Levine, MD;  Location: [facility];  Service: Neurosurgery;  Laterality: N/A;  prone    LIPOMA EXCISION  2018   removed under left breast and right thigh.   TUBAL LIGATION     Social History:  Social History   Socioeconomic History   Marital status: Married    Spouse name: Not on file   Number of children: 3   Years of education: Not on file   Highest education level: Not on file  Occupational History   Occupation: owns Academic librarian co    Employer: [employer]  Tobacco Use   Smoking status: Former    Packs/day: 0.25    Types: Cigarettes    Quit date: 10/31/2012    Years since quitting: 9.5   Smokeless tobacco: Never  Vaping Use   Vaping Use: Never used  Substance and Sexual Activity   Alcohol use: Yes    Alcohol/week: 0.0 standard drinks of alcohol    Comment: rare   Drug use: No   Sexual activity: Yes  Other Topics Concern   Not on file  Social History Narrative   Not on file   Social Determinants of Health   Financial Resource Strain: Not on file  Food Insecurity: Not on file  Transportation Needs: Not on file  Physical Activity: Not on file  Stress: Not on file  Social Connections: Not on file  Intimate Partner Violence: Not on file   Family History:  Family History  Problem Relation Age of Onset   Heart  disease Father    Drug abuse Daughter    Drug abuse Son    Cancer Sister    Heart disease Brother    Heart attack Brother     Review of Systems: Constitutional: Doesn't report fevers, chills or abnormal weight loss Eyes: Doesn't report blurriness of vision Ears, nose, mouth, throat, and face: Doesn't report sore throat Respiratory: Doesn't report cough, dyspnea or wheezes Cardiovascular: Doesn't report palpitation, chest discomfort  Gastrointestinal:  Doesn't report nausea, constipation, diarrhea GU: Doesn't report incontinence Skin: Doesn't report skin rashes Neurological: Per HPI Musculoskeletal: Doesn't report joint pain Behavioral/Psych: Doesn't report anxiety  Physical Exam: Wt Readings from Last 3 Encounters:  04/30/22 151 lb 9.6 oz (68.8 kg)  04/13/22 151 lb (68.5 kg)  03/23/22 151 lb 4.8 oz (68.6 kg)   Temp Readings from Last 3 Encounters:  04/30/22 98.2 F (36.8 C) (Oral)  04/13/22 (!) 97.2 F (36.2 C)  03/23/22 98.8 F (37.1 C) (Oral)   BP Readings from Last 3 Encounters:  04/30/22 (!) 110/95  04/13/22 (!) 117/99  04/13/22 (!) 136/99   Pulse Readings from Last 3 Encounters:  04/30/22 100  04/13/22 94  03/23/22 94    KPS: 60. General: Wheelchair bound  Head: Normal EENT: No conjunctival injection or scleral icterus.  Lungs: Resp effort normal Cardiac: Regular rate Abdomen: Non-distended abdomen Skin: No rashes cyanosis or petechiae. Extremities: No clubbing or edema  Neurologic Exam: Mental Status: Awake, alert, attentive to examiner. Oriented to self and environment. Language is fluent with intact comprehension.  Cranial Nerves: Visual acuity is grossly normal. Noted dysarthria. Visual fields are full. Extra-ocular movements intact. No ptosis. Face is symmetric Motor: Tone and bulk are normal. Power is full in both arms and legs. Reflexes are symmetric, no pathologic reflexes  present.  Dense dysmetria, coordination impaired bilaterally. Sensory:  Intact to light touch Gait: Non ambulatory today  Labs: I have reviewed the data as listed    Component Value Date/Time   NA 138 04/13/2022 1031   K 3.2 (L) 04/13/2022 1031   CL 99 04/13/2022 1031   CO2 31 04/13/2022 1031   GLUCOSE 134 (H) 04/13/2022 1031   BUN 12 04/13/2022 1031   CREATININE 0.79 04/13/2022 1031   CREATININE 0.58 11/15/2017 0948   CALCIUM 8.9 04/13/2022 1031   PROT 7.4 04/13/2022 1031   ALBUMIN 3.7 04/13/2022 1031   AST 21 04/13/2022 1031   ALT 19 04/13/2022 1031   ALKPHOS 65 04/13/2022 1031   BILITOT 0.3 04/13/2022 1031   GFRNONAA >60 04/13/2022 1031   GFRNONAA 100 11/15/2017 0948   GFRAA >60 06/09/2020 1051   GFRAA 116 11/15/2017 0948   Lab Results  Component Value Date   WBC 6.6 04/13/2022   NEUTROABS 3.3 04/13/2022   HGB 13.4 04/13/2022   HCT 37.4 04/13/2022   MCV 94.7 04/13/2022   PLT 414 (H) 04/13/2022    Imaging:  [facility] Clinician Interpretation: I have personally reviewed the CNS images as listed.  My interpretation, in the context of the patient's clinical presentation, is progressive disease  MR BRAIN W WO CONTRAST  Result Date: 04/28/2022 CLINICAL DATA:  Brain metastases, assess treatment response EXAM: MRI HEAD WITHOUT AND WITH CONTRAST TECHNIQUE: Multiplanar, multiecho pulse sequences of the brain and surrounding structures were obtained without and with intravenous contrast. CONTRAST:  80m MULTIHANCE GADOBENATE DIMEGLUMINE 529 MG/ML IV SOLN COMPARISON:  01/26/2022 FINDINGS: Brain: Increased contrast enhancement associated with the cerebellar lesions. The left cerebellar and vermian lesion measures up to 3.2 x 3.0 x 2.1 cm (AP x TR x CC) (series 14, image 41 and series 15, image 16), previously 2.8 x 2.7 x 2.0 cm when remeasured similarly. Increased surrounding T2 hyperintense signal, likely edema. Small area of diffusion restriction along the anterior aspect of this lesion (series 3, image 50. Increased size and confluence of previously noted  enhancement in the right cerebral hemisphere, now measuring up to 2.1 x 1.2 x 1.5 cm (series 14, image 40 and series 15, image 16), previously up to 1.7 x 1.2 x 0.9 cm. Additional stippled enhancement more superiorly (series 14, images 49-52) also increased in enhancement. Left precentral gyrus lesion remains 3 mm in size but is increased in enhancement (series 14, image 121). Right parietal lesion measures up to 9 mm (series 14, image 93), previously up to 3 mm. Right occipital lesion measures 5 mm and is new from prior exam (series 14, image 59). Increased enhancement in a small anterior right frontal lobe lesion, measuring 3 mm (series 15, image 39), previously punctate. No nodular dural enhancement. No evidence acute infarct, midline shift, hydrocephalus or extra-axial collection. Vascular: Normal arterial flow voids. Normal arterial and venous enhancement. Skull and upper cervical spine: T1 hypointense, T2 hyperintense, enhancing lesion in the left parietal calvarium measures up to 9 mm (series 13, image 15), previously 7 mm. Other T1 and T2 hyperintense lesions in the calvarium, consistent with benign hemangiomas. Prior left suboccipital craniotomy. Sinuses/Orbits: Mucous retention cyst in the left maxillary sinus. Mucosal thickening in the ethmoid air cells and right frontal sinus. The orbits are unremarkable. Other: Fluid in the left mastoid air cells. IMPRESSION: 1. Increased size and enhancement numerous previously noted lesions in the cerebral and cerebellar hemispheres, with a new lesion noted in the right occipital lobe, concerning for  worsening metastatic disease. Increased associated edema, particularly surrounding the left occipital lesion. 2. 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 Electronically Signed   By: Merilyn Baba M.D.   On: 04/28/2022 20:37     Assessment/Plan Metastasis to brain [facility])   Ms. Wiles presents today with modest progression  of focal deficits localizing to the posterior fossa.  MRI brain demonstrates progression of disease, with increased burden of enhancement.  Overall disease burden is only mildly progressed, however, when compared to MRI from December 2022 prior to avastin.  Etiology is still suspected radiation necrosis, with confirmatory histology from March 2022.  We discussed and recommended resumption of avastin, 58m/kg IV q3 weeks for refractory radionecrosis.  She had previously demonstrated positive response clinically and radiographically.  Will defer steroids due to poor tolerance and efficacy.   [facility]  She will re-engage with PT/OT for functional deficits.  We appreciate the opportunity to participate in the care of Virginia [name]  Avastin could potentially be added to next infusion scheduled for 05/03/22, pending authorization.  We ask that Virginia SWINGLERreturn to clinic in 3 months following next brain MRI, or sooner as needed given changes noted today.  All questions were answered. The patient knows to call the clinic with any problems, questions or concerns. No barriers to learning were detected.  The total time spent in the encounter was 40 minutes and more than 50% was on counseling and review of test results   ZVentura Sellers MD Medical Director of Neuro-Oncology [facility] [place]08/21/23 12:41 PM

## 2022-05-01 ENCOUNTER — Other Ambulatory Visit: Payer: Self-pay | Admitting: Physician Assistant

## 2022-05-01 ENCOUNTER — Institutional Professional Consult (permissible substitution): Payer: BC Managed Care – PPO | Admitting: Plastic Surgery

## 2022-05-01 MED ORDER — XTAMPZA ER 13.5 MG PO C12A: 1.0000 | EXTENDED_RELEASE_CAPSULE | Freq: Two times a day (BID) | ORAL | 0 refills | Status: DC

## 2022-05-01 NOTE — Progress Notes (Signed)
The following biosimilar Mvasi (bevacizumab-awwb) has been selected for use in this patient.  Kennith Center, Pharm.D., CPP 05/01/2022@1 :53 PM

## 2022-05-03 ENCOUNTER — Other Ambulatory Visit: Payer: Self-pay | Admitting: Radiation Therapy

## 2022-05-03 ENCOUNTER — Inpatient Hospital Stay: Payer: BC Managed Care – PPO

## 2022-05-03 ENCOUNTER — Inpatient Hospital Stay: Payer: BC Managed Care – PPO | Admitting: Hematology and Oncology

## 2022-05-03 ENCOUNTER — Other Ambulatory Visit: Payer: Self-pay

## 2022-05-03 VITALS — BP 120/87 | HR 87 | Temp 97.7°F | Resp 16 | Wt 151.0 lb

## 2022-05-03 DIAGNOSIS — Z5111 Encounter for antineoplastic chemotherapy: Secondary | ICD-10-CM | POA: Diagnosis not present

## 2022-05-03 DIAGNOSIS — C7931 Secondary malignant neoplasm of brain: Secondary | ICD-10-CM

## 2022-05-03 DIAGNOSIS — M7989 Other specified soft tissue disorders: Secondary | ICD-10-CM | POA: Diagnosis not present

## 2022-05-03 DIAGNOSIS — R0600 Dyspnea, unspecified: Secondary | ICD-10-CM | POA: Diagnosis not present

## 2022-05-03 DIAGNOSIS — R918 Other nonspecific abnormal finding of lung field: Secondary | ICD-10-CM | POA: Diagnosis not present

## 2022-05-03 DIAGNOSIS — I6789 Other cerebrovascular disease: Secondary | ICD-10-CM

## 2022-05-03 DIAGNOSIS — I700 Atherosclerosis of aorta: Secondary | ICD-10-CM | POA: Diagnosis not present

## 2022-05-03 DIAGNOSIS — Z7901 Long term (current) use of anticoagulants: Secondary | ICD-10-CM | POA: Diagnosis not present

## 2022-05-03 DIAGNOSIS — Z5112 Encounter for antineoplastic immunotherapy: Secondary | ICD-10-CM | POA: Diagnosis not present

## 2022-05-03 DIAGNOSIS — E032 Hypothyroidism due to medicaments and other exogenous substances: Secondary | ICD-10-CM

## 2022-05-03 DIAGNOSIS — Y842 Radiological procedure and radiotherapy as the cause of abnormal reaction of the patient, or of later complication, without mention of misadventure at the time of the procedure: Secondary | ICD-10-CM | POA: Diagnosis not present

## 2022-05-03 DIAGNOSIS — K838 Other specified diseases of biliary tract: Secondary | ICD-10-CM | POA: Diagnosis not present

## 2022-05-03 DIAGNOSIS — I10 Essential (primary) hypertension: Secondary | ICD-10-CM | POA: Diagnosis not present

## 2022-05-03 DIAGNOSIS — R42 Dizziness and giddiness: Secondary | ICD-10-CM | POA: Diagnosis not present

## 2022-05-03 DIAGNOSIS — R112 Nausea with vomiting, unspecified: Secondary | ICD-10-CM | POA: Diagnosis not present

## 2022-05-03 DIAGNOSIS — E876 Hypokalemia: Secondary | ICD-10-CM | POA: Diagnosis not present

## 2022-05-03 DIAGNOSIS — R55 Syncope and collapse: Secondary | ICD-10-CM | POA: Diagnosis not present

## 2022-05-03 DIAGNOSIS — R609 Edema, unspecified: Secondary | ICD-10-CM | POA: Diagnosis not present

## 2022-05-03 DIAGNOSIS — C3431 Malignant neoplasm of lower lobe, right bronchus or lung: Secondary | ICD-10-CM | POA: Diagnosis not present

## 2022-05-03 LAB — CMP (CANCER CENTER ONLY)
ALT: 17 U/L (ref 0–44)
AST: 19 U/L (ref 15–41)
Albumin: 3.5 g/dL (ref 3.5–5.0)
Alkaline Phosphatase: 62 U/L (ref 38–126)
Anion gap: 6 (ref 5–15)
BUN: 12 mg/dL (ref 8–23)
CO2: 32 mmol/L (ref 22–32)
Calcium: 9.7 mg/dL (ref 8.9–10.3)
Chloride: 101 mmol/L (ref 98–111)
Creatinine: 0.59 mg/dL (ref 0.44–1.00)
GFR, Estimated: >60 mL/min (ref >60)
Glucose, Bld: 116 mg/dL — ABNORMAL HIGH (ref 70–99)
Potassium: 3.6 mmol/L (ref 3.5–5.1)
Sodium: 139 mmol/L (ref 135–145)
Total Bilirubin: 0.3 mg/dL (ref 0.3–1.2)
Total Protein: 6.7 g/dL (ref 6.5–8.1)

## 2022-05-03 LAB — CBC WITH DIFFERENTIAL (CANCER CENTER ONLY)
Abs Immature Granulocytes: 0.03 10*3/uL (ref 0.00–0.07)
Basophils Absolute: 0 10*3/uL (ref 0.0–0.1)
Basophils Relative: 0 %
Eosinophils Absolute: 0.2 10*3/uL (ref 0.0–0.5)
Eosinophils Relative: 2 %
HCT: 33.2 % — ABNORMAL LOW (ref 36.0–46.0)
Hemoglobin: 11.7 g/dL — ABNORMAL LOW (ref 12.0–15.0)
Immature Granulocytes: 0 %
Lymphocytes Relative: 28 %
Lymphs Abs: 1.9 10*3/uL (ref 0.7–4.0)
MCH: 33.9 pg (ref 26.0–34.0)
MCHC: 35.2 g/dL (ref 30.0–36.0)
MCV: 96.2 fL (ref 80.0–100.0)
Monocytes Absolute: 1 10*3/uL (ref 0.1–1.0)
Monocytes Relative: 15 %
Neutro Abs: 3.8 10*3/uL (ref 1.7–7.7)
Neutrophils Relative %: 55 %
Platelet Count: 362 10*3/uL (ref 150–400)
RBC: 3.45 MIL/uL — ABNORMAL LOW (ref 3.87–5.11)
RDW: 14.5 % (ref 11.5–15.5)
WBC Count: 7 10*3/uL (ref 4.0–10.5)
nRBC: 0 % (ref 0.0–0.2)

## 2022-05-03 LAB — TSH: TSH: 1.821 u[IU]/mL (ref 0.350–4.500)

## 2022-05-03 LAB — TOTAL PROTEIN, URINE DIPSTICK: Protein, ur: NEGATIVE mg/dL

## 2022-05-03 MED ORDER — OXYCODONE HCL 5 MG PO TABS
5.0000 mg | ORAL_TABLET | Freq: Once | ORAL | Status: AC
  Administered 2022-05-03: 5 mg via ORAL
  Filled 2022-05-03: qty 1

## 2022-05-03 MED ORDER — SODIUM CHLORIDE 0.9 % IV SOLN
500.0000 mg/m2 | Freq: Once | INTRAVENOUS | Status: AC
  Administered 2022-05-03: 900 mg via INTRAVENOUS
  Filled 2022-05-03: qty 20

## 2022-05-03 MED ORDER — SODIUM CHLORIDE 0.9 % IV SOLN
200.0000 mg | Freq: Once | INTRAVENOUS | Status: AC
  Administered 2022-05-03: 200 mg via INTRAVENOUS
  Filled 2022-05-03: qty 200

## 2022-05-03 MED ORDER — SODIUM CHLORIDE 0.9 % IV SOLN
10.0000 mg/kg | Freq: Once | INTRAVENOUS | Status: AC
  Administered 2022-05-03: 700 mg via INTRAVENOUS
  Filled 2022-05-03: qty 16

## 2022-05-03 MED ORDER — SODIUM CHLORIDE 0.9 % IV SOLN: Freq: Once | INTRAVENOUS | Status: AC

## 2022-05-03 MED ORDER — PROCHLORPERAZINE MALEATE 10 MG PO TABS
10.0000 mg | ORAL_TABLET | Freq: Once | ORAL | Status: AC
  Administered 2022-05-03: 10 mg via ORAL
  Filled 2022-05-03: qty 1

## 2022-05-03 NOTE — Patient Instructions (Signed)
[facility]COL[facility]  Discharge Instructions: Thank you for choosing [facility] to provide your oncology and hematology care.   If you have a lab appointment with the [facility], please go directly to the [facility] and check in at the registration area.   Wear comfortable clothing and clothing appropriate for easy access to any Portacath or PICC line.   We strive to give you quality time with your provider. You may need to reschedule your appointment if you arrive late (15 or more minutes).  Arriving late affects you and other patients whose appointments are after yours.  Also, if you miss three or more appointments without notifying the office, you may be dismissed from the clinic at the provider's discretion.      For prescription refill requests, have your pharmacy contact our office and allow 72 hours for refills to be completed.    Today you received the following chemotherapy and/or immunotherapy agents: Pembrolizumab (Keytruda), Bevacizumab (MVASI), Pemetrexed (Alimta)      To help prevent nausea and vomiting after your treatment, we encourage you to take your nausea medication as directed.  BELOW ARE SYMPTOMS THAT SHOULD BE REPORTED IMMEDIATELY: *FEVER GREATER THAN 100.4 F (38 C) OR HIGHER *CHILLS OR SWEATING *NAUSEA AND VOMITING THAT IS NOT CONTROLLED WITH YOUR NAUSEA MEDICATION *UNUSUAL SHORTNESS OF BREATH *UNUSUAL BRUISING OR BLEEDING *URINARY PROBLEMS (pain or burning when urinating, or frequent urination) *BOWEL PROBLEMS (unusual diarrhea, constipation, pain near the anus) TENDERNESS IN MOUTH AND THROAT WITH OR WITHOUT PRESENCE OF ULCERS (sore throat, sores in mouth, or a toothache) UNUSUAL RASH, SWELLING OR PAIN  UNUSUAL VAGINAL DISCHARGE OR ITCHING   Items with * indicate a potential emergency and should be followed up as soon as possible or go to the Emergency Department if any problems should occur.  Please show the  CHEMOTHERAPY ALERT CARD or IMMUNOTHERAPY ALERT CARD at check-in to the Emergency Department and triage nurse.  Should you have questions after your visit or need to cancel or reschedule your appointment, please contact [facility]  Dept: [phone]  and follow the prompts.  Office hours are 8:00 a.m. to 4:30 p.m. Monday - Friday. Please note that voicemails left after 4:00 p.m. may not be returned until the following business day.  We are closed weekends and major holidays. You have access to a nurse at all times for urgent questions. Please call the main number to the clinic Dept: [phone] and follow the prompts.   For any non-urgent questions, you may also contact your provider using MyChart. We now offer e-Visits for anyone 45 and older to request care online for non-urgent symptoms. For details visit [email or web address].   Also download the MyChart app! Go to the app store, search "MyChart", open the app, select [facility], and log in with your MyChart username and password.  Masks are optional in the cancer centers. If you would like for your care team to wear a mask while they are taking care of you, please let them know. You may have one support person who is at least 65 years old accompany you for your appointments.

## 2022-05-03 NOTE — Progress Notes (Signed)
Verbal order obtained for 5mg  oxycodone per Dr. Lorenso Courier for back pain. Carlis Abbott, RN verified order with this nurse.

## 2022-05-11 ENCOUNTER — Telehealth: Payer: Self-pay

## 2022-05-11 NOTE — Telephone Encounter (Signed)
Pt's husband called to report that since 05/04/22 patient has been having  headaches, low grade fever and increased dizziness. Husband wanted to make Dr. Mickeal Skinner aware.  Routed to provider.

## 2022-05-12 ENCOUNTER — Encounter: Payer: Self-pay | Admitting: Internal Medicine

## 2022-05-12 ENCOUNTER — Encounter: Payer: Self-pay | Admitting: Hematology and Oncology

## 2022-05-12 MED ORDER — OXYCODONE HCL 5 MG PO TABS
ORAL_TABLET | ORAL | 0 refills | Status: DC
  Filled 2022-05-12: qty 90, 8d supply, fill #0

## 2022-05-12 NOTE — Progress Notes (Signed)
START ON PATHWAY REGIMEN - Non-Small Cell Lung     A cycle is every 21 days:     Pembrolizumab      Pemetrexed   **Always confirm dose/schedule in your pharmacy ordering system**  Patient Characteristics: Stage IV Metastatic, Nonsquamous, Molecular Analysis Completed, Molecular Alteration Present and Targeted Therapy Exhausted OR EGFR Exon 20+ or KRAS G12C+ or HER2+ Present and No Prior Chemo/Immunotherapy OR No Alteration Present, Maintenance  Chemotherapy/Immunotherapy, PS = 0, 1, Initial Pembrolizumab + Pemetrexed + Carboplatin Therapeutic Status: Stage IV Metastatic Histology: Nonsquamous Cell Broad Molecular Profiling Status: Molecular Analysis Completed Molecular Analysis Results: Alteration Present and Targeted Therapy Exhausted ECOG Performance Status: 1 Chemotherapy/Immunotherapy Line of Therapy: Maintenance Chemotherapy/Immunotherapy Intent of Therapy: Non-Curative / Palliative Intent, Discussed with Patient

## 2022-05-12 NOTE — Progress Notes (Signed)
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 CT of the head showed showed concerning for metastatic disease involving the left cerebellum and right occipital lobe.  2) 09/30/2019: MRI brain confirms mass within the left cerebellum measures 1.6 x 1.3 x 1.5 cm as well as at least 8 metastatic lesions within the supratentorial and infratentorial brain as outlined 3) 10/01/2019: PET CT scan revealed hypermetabolic infrahilar right lower lobe mass with hypermetabolic nodal metastases in the right hilum, mediastinum, right supraclavicular region, left axilla and lower left neck. 4) 10/08/2019: US guided biopsy of the lymph nodes confirms poorly differentiated non-small cell carcinoma. Immunohistochemistry confirms adenocarcinoma of lung primary. PD-L1 and NGS later revealed a MET Exon 14 mutation and TPS of 90%.  5) 10/12/2019: Establish care with Dr. Lorenso Courier  6) 11/04/2019: Day 1 of capmatinib 427m BID 7) 11/09/2019: presented to Rad/Onc with a DVT in LLE. Seen in symptom management clinic and started on apixaban.  8) 12/29/2019: CT C/A/P showed interval decrease in size of mass involving the superior segment of right lower lobe and reduction in mediastinal and left supraclavicular adenopathy though some small spinal lesions were noted in the interim between the two sets of scans 9) 01/25/2020: temporarily stopped capmatinib due to symptoms of nausea/fatigue. Restarted therapy on 01/30/2020 after brief chemo holiday.  10)  03/22/2020: CT C/A/P reveals a mixed picture, with new lung nodule and increased lymphadenopathy, however response in bone lesions. D/c capmatinib therapy 11) 04/08/2020: start of Carbo/Pem/Pem for 2nd line treatment. Cycle 1 Day 1   12) 04/29/2020: Cycle 2 Day 1 Carbo/Pem/Pem  13) 05/20/2020: Cycle 3 Day 1 Carbo/Pem/Pem  14) 06/09/2020: Cycle 4 Day 1 Carbo/Pem/Pem  15) 06/29/2020: Cycle 5 Day 1 maintenance Pem/Pem  16) 07/20/2020: Cycle 6 Day 1 maintenance Pem/Pem  17) 08/12/2020: Cycle 7 Day 1 maintenance Pem/Pem  18) 09/01/2020: Cycle 8 Day 1 maintenance Pem/Pem  19) 09/21/2020: Cycle 9 Day 1 maintenance Pem/Pem  20) 10/13/2020: Cycle 10 Day 1 maintenance Pem/Pem  21) 11/03/2020: Cycle 11 Day 1 maintenance Pem/Pem  22) 11/25/2020:  Cycle 12 Day 1 maintenance Pem/Pem 23) 12/15/2020:  Cycle 13 Day 1 maintenance Pem/Pem 24) 01/05/2021:  Cycle 14 Day 1 maintenance Pem/Pem 25) 01/26/2021:  Cycle 15 Day 1 maintenance Pem/Pem plus Bevacizumab.  02/17/2021: Cycle 16 Day 1 maintenance Pem/Pem plus Bevacizumab.  03/10/2021: Cycle 17 Day 1 maintenance Pem/Pem plus Bevacizumab.  03/31/2021: Cycle 18 Day 1 maintenance Pem/Pem  04/20/2021:  Cycle 19 Day 1 maintenance Pem/Pem  05/12/2021: Cycle 20 Day 1 maintenance Pem/Pem 06/01/2021: Cycle 21 Day 1 maintenance Pem/Pem 06/22/2021: Cycle 22 Day 1 maintenance Pem/Pem 07/13/2021: Cycle 23 Day 1 maintenance Pem/Pem 08/16/2021: Cycle 24 Day 1 maintenance Pem/Pem 09/08/2021: Cycle 25 Day 1 maintenance Pem/Pem plus Bevacizumab 09/28/2021: Cycle 26 Day 1 maintenance Pem/Pem plus Bevacizumab 10/20/2021: Cycle 27 Day 1 maintenance Pem/Pem plus Bevacizumab 11/09/2021: Cycle 28 Day 1 maintenance Pem/Pem  12/06/2021: Cycle 29 Day 1 maintenance Pem/Pem  12/27/2021: Cycle 30 Day 1 maintenance Pem/Pem  01/18/2022: Cycle 31 Day 1 maintenance Pem/Pem  02/07/2022: Cycle 32 Day  1 maintenance Pem/Pem  02/28/2022: Cycle 33 Day 1 maintenance Pem/Pem  03/23/2022: Cycle 34 Day 1 maintenance Pem/Pem   04/13/2022: Cycle 35 Day 1 maintenance Pem/Pem  05/04/2022: Cycle 36 Day 1 maintenance Pem/Pem   Interval History:  SINIA ANTOSH 66 y.o. female with medical history significant for metastatic adenocarcinoma of the lung presents for a follow up visit. The patient's last visit was on 04/13/2022.  In the interim since the last visit, she continues on maintenance Pem/Pem.   On exam today Mrs. Manon is accompanied by her husband.  She reports she has been doing great in the interim since her last visit.  She reports that she continues to have her dizzy spells but they are about the same.  She reports that her pain level present is currently under good control.  It continues to be predominantly in her back.  She reports that she continues to have bowel movements despite her opioid therapy.  She is taking her Xtampza every 12 hours as prescribed and oxycodone as approximate 2 to 3/day.  She reports no other major recent changes in her health.  She has not been having any difficulty with fevers, chills, sweats, vomiting, or diarrhea.  Overall she is willing and able to proceed with treatment at this time.  MEDICAL HISTORY:  Past Medical History:  Diagnosis Date   Anemia    Anxiety    Concussion 09/28/2019   DVT (deep venous thrombosis) ([place]) 2021   L leg   Dyspnea    GERD (gastroesophageal reflux disease)    Hypercholesterolemia    per pt, she does not have elevated lipids   Hypertension    met lung ca dx'd 09/2019   mets to spine, hip and brain   PONV (postoperative nausea and vomiting)    Tobacco abuse     SURGICAL HISTORY: Past Surgical History:  Procedure Laterality Date   ABDOMINAL HYSTERECTOMY     partial/ left ovaries   APPLICATION OF CRANIAL NAVIGATION N/A 12/02/2020   Procedure: APPLICATION OF CRANIAL NAVIGATION;  Surgeon: Erline Levine, MD;  Location: [facility];  Service: Neurosurgery;  Laterality: N/A;   CHOLECYSTECTOMY     CRANIOTOMY N/A 12/02/2020   Procedure: Posterior fossa  craniotomy for tumor resection with brainlab;  Surgeon: Erline Levine, MD;  Location: [facility];  Service: Neurosurgery;  Laterality: N/A;   DILATION AND CURETTAGE OF UTERUS     IR IMAGING GUIDED PORT INSERTION  10/23/2019   IR PATIENT EVAL TECH 0-60 MINS  11/21/2021   IR PATIENT EVAL TECH 0-60 MINS  11/24/2021   IR PATIENT EVAL TECH 0-60 MINS  11/29/2021   IR PATIENT EVAL TECH 0-60 MINS  12/04/2021   IR PATIENT EVAL TECH 0-60 MINS  12/12/2021   IR PATIENT EVAL TECH 0-60 MINS  12/19/2021   IR PATIENT EVAL TECH 0-60 MINS  12/27/2021   IR PATIENT EVAL TECH 0-60 MINS  01/03/2022   IR PATIENT EVAL TECH 0-60 MINS  01/10/2022   IR PATIENT EVAL TECH 0-60 MINS  01/18/2022   IR PATIENT EVAL TECH 0-60 MINS  02/13/2022   IR PATIENT EVAL TECH 0-60 MINS  02/20/2022   IR RADIOLOGIST EVAL & MGMT  01/23/2022   IR REMOVAL TUN ACCESS W/ PORT W/O FL MOD SED  11/17/2021   KYPHOPLASTY N/A 03/15/2020   Procedure: Thoracic Eight KYPHOPLASTY;  Surgeon: Erline Levine, MD;  Location: [facility];  Service: Neurosurgery;  Laterality: N/A;  prone    LIPOMA EXCISION  2018  removed under left breast and right thigh.   TUBAL LIGATION      ALLERGIES:  has No Known Allergies.  MEDICATIONS:  Current Outpatient Medications  Medication Sig Dispense Refill   acetaminophen (TYLENOL) 500 MG tablet Take 500 mg by mouth 2 (two) times daily as needed for moderate pain.     albuterol (VENTOLIN HFA) 108 (90 Base) MCG/ACT inhaler INHALE 2 PUFFS INTO THE LUNGS EVERY 6 HOURS AS NEEDED FOR WHEEZING OR SHORTNESS OF BREATH 6.7 g 11   ALPRAZolam (XANAX) 0.25 MG tablet Take 1 tablet (0.25 mg total) by mouth 2 (two) times daily as needed for anxiety 60 tablet 1   amLODipine (NORVASC) 10 MG tablet TAKE 1 TABLET BY MOUTH EVERY DAY 90 tablet 3   Cyanocobalamin (VITAMIN B12 PO) Take 1 tablet by mouth daily. Gummies     EPINEPHrine 0.3 mg/0.3 mL IJ SOAJ injection Inject 0.3 mg into the muscle as needed for anaphylaxis. 2 each 0   folic acid (FOLVITE) 1 MG tablet  Take 1 tablet by mouth daily. 90 tablet 1   furosemide (LASIX) 20 MG tablet Take 2 tablets (40 mg total) by mouth 2 times daily. 360 tablet 0   Glucosamine HCl (GLUCOSAMINE PO) Take 2 tablets by mouth daily.     lidocaine-prilocaine (EMLA) cream Apply to port as needed. 30 g 0   LYSINE PO Take 1 tablet by mouth daily.     meclizine (ANTIVERT) 25 MG tablet Take 1 tablet (25 mg total) by mouth 3 (three) times daily as needed for dizziness. (Patient not taking: Reported on 02/09/2022) 30 tablet 0   Multiple Vitamin (MULTIVITAMIN WITH MINERALS) TABS tablet Take 1 tablet by mouth daily.     Multiple Vitamins-Minerals (AIRBORNE PO) Take 1 tablet by mouth daily.      OLANZapine (ZYPREXA) 2.5 MG tablet TAKE 1 TABLET BY MOUTH AT BEDTIME 90 tablet 1   ondansetron (ZOFRAN) 4 MG tablet Take 1 tablet (4 mg total) by mouth every 8 (eight) hours as needed for nausea or vomiting. 40 tablet 2   OVER THE COUNTER MEDICATION Take 2 tablets by mouth daily. Beet root     oxyCODONE (OXY IR/ROXICODONE) 5 MG immediate release tablet TAKE 1 TO 2 TABLETS BY MOUTH EVERY 4 HOURS AS NEEDED FOR SEVERE PAIN 90 tablet 0   pantoprazole (PROTONIX) 40 MG tablet TAKE 1 TABLET BY MOUTH ONCE DAILY 90 tablet 3   polyethylene glycol (MIRALAX / GLYCOLAX) 17 g packet Take 17 g by mouth every evening.     potassium chloride SA (KLOR-CON M) 20 MEQ tablet Take 2 tablets (40 mEq total) by mouth 2 (two) times daily. 120 tablet 1   Tetrahydrozoline HCl (VISINE OP) Place 1 drop into both eyes daily as needed (irritation).     VITAMIN D PO Take 1 capsule by mouth daily.     vitamin E 180 MG (400 UNITS) capsule Take 400 IU daily x 1 week, then 400 IU BID (Patient taking differently: Take 400 Units by mouth daily.) 60 capsule 4   XTAMPZA ER 13.5 MG C12A Take 1 capsule by mouth in the morning and at bedtime. (every 12 hours) 60 capsule 0   No current facility-administered medications for this visit.    REVIEW OF SYSTEMS:   Constitutional: ( - )  fevers, ( - )  chills , ( - ) night sweats Eyes: ( - ) blurriness of vision, ( - ) double vision, ( - ) watery eyes Ears, nose, mouth, throat,  and face: ( - ) mucositis, ( - ) sore throat Respiratory: ( - ) cough, ( + ) dyspnea, ( - ) wheezes Cardiovascular: ( - ) palpitation, ( - ) chest discomfort, ( - ) lower extremity swelling Gastrointestinal:  ( + ) nausea, ( - ) heartburn, ( - ) change in bowel habits Skin: ( - ) abnormal skin rashes Lymphatics: ( - ) new lymphadenopathy, ( - ) easy bruising Neurological: ( - ) numbness, ( - ) tingling, ( - ) new weaknesses Behavioral/Psych: ( - ) mood change, ( - ) new changes  All other systems were reviewed with the patient and are negative.  PHYSICAL EXAMINATION: ECOG PERFORMANCE STATUS: 1 - Symptomatic but completely ambulatory  There were no vitals filed for this visit.    There were no vitals filed for this visit.  GENERAL: well appearing middle aged Caucasian female in NAD  SKIN: skin color, texture, turgor are normal, no rashes or significant lesions EYES: conjunctiva are pink and non-injected, sclera clear LUNGS: wheezing at lung bases. clear to auscultation and percussion with normal breathing effort HEART: regular rate & rhythm and no murmurs and bilateral trace pitting lower extremity edema Musculoskeletal: no cyanosis of digits and no clubbing PSYCH: alert & oriented x 3, fluent speech NEURO: no focal motor/sensory deficits  LABORATORY DATA:  I have reviewed the data as listed    Latest Ref Rng & Units 05/03/2022   11:05 AM 04/13/2022   10:31 AM 03/23/2022   10:26 AM  CBC  WBC 4.0 - 10.5 K/uL 7.0  6.6  6.7   Hemoglobin 12.0 - 15.0 g/dL 11.7  13.4  13.9   Hematocrit 36.0 - 46.0 % 33.2  37.4  38.6   Platelets 150 - 400 K/uL 362  414  382        Latest Ref Rng & Units 05/03/2022   11:05 AM 04/13/2022   10:31 AM 03/23/2022   10:26 AM  CMP  Glucose 70 - 99 mg/dL 116  134  135   BUN 8 - 23 mg/dL _0 Creatinine 0.44 -  1.00 mg/dL 0.59  0.79  0.80   Sodium 135 - 145 mmol/L 139  138  138   Potassium 3.5 - 5.1 mmol/L 3.6  3.2  2.9   Chloride 98 - 111 mmol/L 101  99  98   CO2 22 - 32 mmol/L 32  31  30   Calcium 8.9 - 10.3 mg/dL 9.7  8.9  9.4   Total Protein 6.5 - 8.1 g/dL 6.7  7.4  7.6   Total Bilirubin 0.3 - 1.2 mg/dL 0.3  0.3  0.5   Alkaline Phos 38 - 126 U/L 62  65  79   AST 15 - 41 U/L _1 ALT 0 - 44 U/L _2 RADIOGRAPHIC STUDIES: I personally have viewed the radiographic studies below: MR BRAIN W WO CONTRAST  Result Date: 04/28/2022 CLINICAL DATA:  Brain metastases, assess treatment response EXAM: MRI HEAD WITHOUT AND WITH CONTRAST TECHNIQUE: Multiplanar, multiecho pulse sequences of the brain and surrounding structures were obtained without and with intravenous contrast. CONTRAST:  88m MULTIHANCE GADOBENATE DIMEGLUMINE 529 MG/ML IV SOLN COMPARISON:  01/26/2022 FINDINGS: Brain: Increased contrast enhancement associated with the cerebellar lesions. The left cerebellar and vermian lesion measures up to 3.2 x 3.0 x 2.1 cm (AP x TR x CC) (series 14, image 41  and series 15, image 16), previously 2.8 x 2.7 x 2.0 cm when remeasured similarly. Increased surrounding T2 hyperintense signal, likely edema. Small area of diffusion restriction along the anterior aspect of this lesion (series 3, image 50. Increased size and confluence of previously noted enhancement in the right cerebral hemisphere, now measuring up to 2.1 x 1.2 x 1.5 cm (series 14, image 40 and series 15, image 16), previously up to 1.7 x 1.2 x 0.9 cm. Additional stippled enhancement more superiorly (series 14, images 49-52) also increased in enhancement. Left precentral gyrus lesion remains 3 mm in size but is increased in enhancement (series 14, image 121). Right parietal lesion measures up to 9 mm (series 14, image 93), previously up to 3 mm. Right occipital lesion measures 5 mm and is new from prior exam (series 14, image 59).  Increased enhancement in a small anterior right frontal lobe lesion, measuring 3 mm (series 15, image 39), previously punctate. No nodular dural enhancement. No evidence acute infarct, midline shift, hydrocephalus or extra-axial collection. Vascular: Normal arterial flow voids. Normal arterial and venous enhancement. Skull and upper cervical spine: T1 hypointense, T2 hyperintense, enhancing lesion in the left parietal calvarium measures up to 9 mm (series 13, image 15), previously 7 mm. Other T1 and T2 hyperintense lesions in the calvarium, consistent with benign hemangiomas. Prior left suboccipital craniotomy. Sinuses/Orbits: Mucous retention cyst in the left maxillary sinus. Mucosal thickening in the ethmoid air cells and right frontal sinus. The orbits are unremarkable. Other: Fluid in the left mastoid air cells. IMPRESSION: 1. Increased size and enhancement numerous previously noted lesions in the cerebral and cerebellar hemispheres, with a new lesion noted in the right occipital lobe, concerning for worsening metastatic disease. Increased associated edema, particularly surrounding the left occipital lesion. 2. 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 Electronically Signed   By: Merilyn Baba M.D.   On: 04/28/2022 20:37     ASSESSMENT & PLAN Virginia [name] 66 y.o. female with medical history significant for metastatic adenocarcinoma of the lung presents for a follow up visit. 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   Previously we discussed Carboplatin/Pembrolizumab/Pemetrexed and the expected side effect from these medications.  We discussed the possible side effects of immunotherapy including colitis, hepatitis, dermatitis, and pneumonitis.  Additionally we discussed the side effects of carboplatin which include suppression of blood counts, nephrotoxicity, and nausea.  Additionally we discussed pemetrexed which can  also have effects on counts and would require supplementation with vitamin K27 and folic acid.  She voiced her understanding of these side effects and treatment plans moving forward.  I also noted that we would be able to drop the chemotherapy agents that she had intolerance to continue with pembrolizumab alone given her high TPS score.  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 Due to worsening dizziness, she resumed this from Cycle 25-27.  --repeat CT CAP on 12/11/2021 showed stable disease with no evidence of progression. Next due in July 2023.  PLAN: --Labs from today were reviewed and adequate for treatment.  Labs today show creatinine 0.59, white blood cell 7.0, hemoglobin 11.7, MCV 96.2, and platelets of 362 --Continue with Cycle 36, Day 1 of maintenance Pem/Pem. --RTC q 3 weeks for labs,  follow up visit with Dr. Lorenso Courier prior to Cycle 37.    #Nausea/Vomiting, improving -- provided patient with Zofran 4-30m q8h PRN and compazine [id number] for breakthrough PRN.  She is currently using Phenergan for breakthrough. --continue olanzpaine 2.5 mg PO QHS to help with nausea. Marked improvement since the addition of this medication on 01/29/2020.  --continue to monitor   #Hypokalemia, stable --likely 2/2 to decreased PO intake, hypovolemia, and BP medications --Potassium level 2.9 today.  Gave 40 mEq of oral potassium chloride with treatment today.  --Currently on potassium chloride tablets, 40 mEq PO potassium in AM and 2067min PM. Due to difficulty swallowing tablets, we will try granules. Sent new prescription today.   #Pain Control, stable --continue oxycodone 5mg40mH PRN. Can increase dose to 10mg34m if pain is severe. She is taking approximately 2-3 pills per day.  --continue Xtampza  13.5 mg ER BID for long acting support.  --continue to take with Senokot to prevent opioid induced constipation. 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 Last 01/18/2022, next due in August 2023.  --nausea as above    #Brain Metastasis, stable --appreciate the assistance of Dr. VasloMickeal Skinnerreating her brain metastasis and symptoms. --radiation therapy to the brain completed on 11/16/2019.  --MRI brain on 04/29/2020 showed evidence of small brain bleed. Eliquis therapy was discontinued.  --MRI on 11/04/2020 showed concern for progression of intracranial disease.  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  --MRI on 12/23/2020 showed evidence of radiation necrosis. Dr. VasloMickeal Skinneruated the patient on 01/02/2021 with recommendations to add IV Bevacizumab 10 mg/kg q 3 weeks ( started 01/26/2021). Completed a full course of this therapy. --Patient was evaluated by Dr. VasloMickeal Skinner2/20/2022 and recommended to resume Bevacizumab due to worsening dizziness.  Which was given from Cycle 25-27.  Plan: --continue to follow with Dr. VasloMickeal Skinnervastin added back in the patient's chemotherapy regimen. --next brain MRI 08/10/2022.   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    # Hypertension: --BP at today's visit was 136/99. Continue Norvasc to 10 mg daily.  --continue to monitor at home and in clinic.    Orders Placed This Encounter  Procedures   TSH    Standing Status:   Future    Standing Expiration Date:   05/24/2023   CBC with Differential ([facility])    Standing Status:   Future    Standing Expiration Date:   05/25/2023   CMP ([facility])    Standing  Status:   Future    Standing Expiration Date:   05/25/2023   TSH    Standing Status:   Future    Standing Expiration Date:   06/14/2023   CBC with Differential ([facility])    Standing Status:   Future    Standing Expiration Date:   06/15/2023   CMP ([facility])    Standing Status:   Future    Standing Expiration Date:   06/15/2023   TSH    Standing Status:   Future    Standing Expiration Date:   07/05/2023   CBC with Differential ([facility] Only)    Standing Status:   Future  Standing Expiration Date:   07/06/2023   CMP ([facility] only)    Standing Status:   Future    Standing Expiration Date:   07/06/2023    All questions were answered. The patient knows to call the clinic with any problems, questions or concerns.  I have spent a total of 30 minutes minutes of face-to-face and non-face-to-face time, preparing to see the patient, performing a medically appropriate examination, counseling and educating the patient,  documenting clinical information in the electronic health record, and care coordination.   Ledell Peoples, MD Department of Hematology/Oncology [facility] at [facility] Phone: [phone] Pager: [phone] Email: [email or web address]   05/12/2022 6:32 PM   Literature Support:  Lorenza Chick, Rodrguez-Abreu D, Duffy Rhody, Felip E, De Angelis F, Domine M, [place], Hochmair MJ, [place], [place], Bischoff HG, [place], Grossi F, [place], Reck M, [place], [place], [place], Rubio-Viqueira B, Novello S, Kurata T, Gray JE, Vida J, Wei Z, Yang J, Raftopoulos H, [place], Seama [employer]; [id number] Investigators. Pembrolizumab plus Chemotherapy in Metastatic Non-Small-Cell Lung Cancer. [place] Med. 2018 May 31;378(22):2078-2092.  --Median progression-free survival was 8.8 months (95% CI, 7.6 to 9.2) in the pembrolizumab-combination group and 4.9 months (95% CI, 4.7 to 5.5) in the placebo-combination group  (hazard ratio for disease progression or death, 0.52; 95% CI, 0.43 to 0.64; P<0.001). Adverse events of grade 3 or higher occurred in 67.2% of the patients in the pembrolizumab-combination group and in 65.8% of those in the placebo-combination group.  Jodene Nam L. Efficacy of pemetrexed-based regimens in advanced non-small cell lung cancer patients with activating epidermal growth factor receptor mutations after tyrosine kinase inhibitor failure: a systematic review. Onco Targets Ther. 2018;11:2121-2129.   --The weighted median PFS, median OS, and ORR for patients treated with pem regimens were 5.09 months, 15.91 months, and 30.19%, respectively. Our systematic review results showed a favorable efficacy profile of pem regimens in NSCLC patients with EGFR mutation after EGFR-TKI failure.

## 2022-05-13 ENCOUNTER — Other Ambulatory Visit: Payer: Self-pay | Admitting: Hematology and Oncology

## 2022-05-14 ENCOUNTER — Other Ambulatory Visit (HOSPITAL_COMMUNITY): Payer: Self-pay

## 2022-05-15 ENCOUNTER — Telehealth: Payer: Self-pay

## 2022-05-15 ENCOUNTER — Other Ambulatory Visit (HOSPITAL_COMMUNITY): Payer: Self-pay

## 2022-05-15 NOTE — Telephone Encounter (Signed)
Husband called requesting Potassium prescription be updated to 3 tablets in AM and 2 tablets in PM per discussion at last oncology visit. States that current prescription has almost run out.  Routed to provider.

## 2022-05-15 NOTE — Telephone Encounter (Signed)
Called pt's husband back to let him know that Dr. Lorenso Courier does not want to change  potassium prescription to 3 tablets in AM and 2 tablets in PM.  Prescription will remain the same.

## 2022-05-20 DIAGNOSIS — I6789 Other cerebrovascular disease: Secondary | ICD-10-CM | POA: Diagnosis not present

## 2022-05-24 ENCOUNTER — Other Ambulatory Visit: Payer: Self-pay | Admitting: *Deleted

## 2022-05-24 ENCOUNTER — Telehealth: Payer: Self-pay | Admitting: *Deleted

## 2022-05-24 NOTE — Progress Notes (Signed)
Patient plans to have dental extraction completed approxmately 09/28-09/29/2023 per Dr Myrle Sheng office based on Dr Renda Rolls recommendation.    Plan for the following.  05/25/2022 Keytruda/Alimta ONLY  06/14/2022 Keytruda/Alimta ONLY  07/05/2022 Keytruda/Alimta PLUS Avastin (if dental extraction occurs when expected).  Need to wait 4 weeks after extraction before proceeding with Avastin.    Dr Mickeal Skinner and Dr Lorenso Courier both agree to plan.    Dr Nicki Reaper Rehm's office will prescribe antibiotics for patient to last until her procedure.  Letter prepared to Oral Surgeon for clearance.

## 2022-05-24 NOTE — Telephone Encounter (Signed)
Patients spouse called.    Patient has increasing dizziness/using Meclizine but it only helps minimally.  Reports having headaches 4-5 days a week.  Also states she has been running low grade fevers 99's/low 100.  She has a dental issue and has tried antibiotics without success and needs to have a dental extraction under sedation.  Spouse is concerned about side effects and how to treat those.    He also needs to know what timing is appropriate for proceeding with dental extraction considering she had Keytruda/Alimta/Avastin on 05/03/2022 and is planned to have it again on 05/25/2022.  Routed to Dr Mickeal Skinner and Dr Lorenso Courier both.

## 2022-05-25 ENCOUNTER — Inpatient Hospital Stay: Payer: BC Managed Care – PPO

## 2022-05-25 ENCOUNTER — Inpatient Hospital Stay: Payer: BC Managed Care – PPO | Admitting: Hematology and Oncology

## 2022-05-25 ENCOUNTER — Inpatient Hospital Stay: Payer: BC Managed Care – PPO | Attending: Hematology and Oncology | Admitting: Hematology and Oncology

## 2022-05-25 ENCOUNTER — Other Ambulatory Visit: Payer: Self-pay

## 2022-05-25 DIAGNOSIS — Z7901 Long term (current) use of anticoagulants: Secondary | ICD-10-CM | POA: Diagnosis not present

## 2022-05-25 DIAGNOSIS — C7931 Secondary malignant neoplasm of brain: Secondary | ICD-10-CM | POA: Insufficient documentation

## 2022-05-25 DIAGNOSIS — E876 Hypokalemia: Secondary | ICD-10-CM | POA: Diagnosis not present

## 2022-05-25 DIAGNOSIS — R918 Other nonspecific abnormal finding of lung field: Secondary | ICD-10-CM

## 2022-05-25 DIAGNOSIS — Z9049 Acquired absence of other specified parts of digestive tract: Secondary | ICD-10-CM | POA: Insufficient documentation

## 2022-05-25 DIAGNOSIS — I10 Essential (primary) hypertension: Secondary | ICD-10-CM | POA: Diagnosis not present

## 2022-05-25 DIAGNOSIS — Z79899 Other long term (current) drug therapy: Secondary | ICD-10-CM | POA: Diagnosis not present

## 2022-05-25 DIAGNOSIS — Z5112 Encounter for antineoplastic immunotherapy: Secondary | ICD-10-CM | POA: Diagnosis not present

## 2022-05-25 DIAGNOSIS — Z86718 Personal history of other venous thrombosis and embolism: Secondary | ICD-10-CM | POA: Diagnosis not present

## 2022-05-25 DIAGNOSIS — R0600 Dyspnea, unspecified: Secondary | ICD-10-CM | POA: Diagnosis not present

## 2022-05-25 DIAGNOSIS — R110 Nausea: Secondary | ICD-10-CM | POA: Diagnosis not present

## 2022-05-25 DIAGNOSIS — Z5111 Encounter for antineoplastic chemotherapy: Secondary | ICD-10-CM | POA: Diagnosis not present

## 2022-05-25 DIAGNOSIS — C3431 Malignant neoplasm of lower lobe, right bronchus or lung: Secondary | ICD-10-CM | POA: Insufficient documentation

## 2022-05-25 DIAGNOSIS — R42 Dizziness and giddiness: Secondary | ICD-10-CM | POA: Insufficient documentation

## 2022-05-25 LAB — CBC WITH DIFFERENTIAL (CANCER CENTER ONLY)
Abs Immature Granulocytes: 0.02 10*3/uL (ref 0.00–0.07)
Basophils Absolute: 0 10*3/uL (ref 0.0–0.1)
Basophils Relative: 1 %
Eosinophils Absolute: 0.1 10*3/uL (ref 0.0–0.5)
Eosinophils Relative: 3 %
HCT: 36 % (ref 36.0–46.0)
Hemoglobin: 12.3 g/dL (ref 12.0–15.0)
Immature Granulocytes: 0 %
Lymphocytes Relative: 31 %
Lymphs Abs: 1.6 10*3/uL (ref 0.7–4.0)
MCH: 33.8 pg (ref 26.0–34.0)
MCHC: 34.2 g/dL (ref 30.0–36.0)
MCV: 98.9 fL (ref 80.0–100.0)
Monocytes Absolute: 0.9 10*3/uL (ref 0.1–1.0)
Monocytes Relative: 18 %
Neutro Abs: 2.4 10*3/uL (ref 1.7–7.7)
Neutrophils Relative %: 47 %
Platelet Count: 357 10*3/uL (ref 150–400)
RBC: 3.64 MIL/uL — ABNORMAL LOW (ref 3.87–5.11)
RDW: 14.3 % (ref 11.5–15.5)
WBC Count: 5 10*3/uL (ref 4.0–10.5)
nRBC: 0 % (ref 0.0–0.2)

## 2022-05-25 LAB — CMP (CANCER CENTER ONLY)
ALT: 15 U/L (ref 0–44)
AST: 23 U/L (ref 15–41)
Albumin: 3.2 g/dL — ABNORMAL LOW (ref 3.5–5.0)
Alkaline Phosphatase: 58 U/L (ref 38–126)
Anion gap: 7 (ref 5–15)
BUN: 13 mg/dL (ref 8–23)
CO2: 29 mmol/L (ref 22–32)
Calcium: 9.3 mg/dL (ref 8.9–10.3)
Chloride: 101 mmol/L (ref 98–111)
Creatinine: 0.74 mg/dL (ref 0.44–1.00)
GFR, Estimated: >60 mL/min (ref >60)
Glucose, Bld: 125 mg/dL — ABNORMAL HIGH (ref 70–99)
Potassium: 3.3 mmol/L — ABNORMAL LOW (ref 3.5–5.1)
Sodium: 137 mmol/L (ref 135–145)
Total Bilirubin: 0.5 mg/dL (ref 0.3–1.2)
Total Protein: 7.1 g/dL (ref 6.5–8.1)

## 2022-05-25 LAB — TSH: TSH: 1.588 u[IU]/mL (ref 0.350–4.500)

## 2022-05-25 MED ORDER — SODIUM CHLORIDE 0.9 % IV SOLN
500.0000 mg/m2 | Freq: Once | INTRAVENOUS | Status: AC
  Administered 2022-05-25: 900 mg via INTRAVENOUS
  Filled 2022-05-25: qty 20

## 2022-05-25 MED ORDER — PROCHLORPERAZINE MALEATE 10 MG PO TABS
10.0000 mg | ORAL_TABLET | Freq: Once | ORAL | Status: AC
  Administered 2022-05-25: 10 mg via ORAL
  Filled 2022-05-25: qty 1

## 2022-05-25 MED ORDER — SODIUM CHLORIDE 0.9 % IV SOLN
200.0000 mg | Freq: Once | INTRAVENOUS | Status: AC
  Administered 2022-05-25: 200 mg via INTRAVENOUS
  Filled 2022-05-25: qty 8

## 2022-05-25 MED ORDER — SODIUM CHLORIDE 0.9 % IV SOLN: Freq: Once | INTRAVENOUS | Status: AC

## 2022-05-25 NOTE — Patient Instructions (Signed)
[facility]   Discharge Instructions: Thank you for choosing [facility] to provide your oncology and hematology care.   If you have a lab appointment with the [facility], please go directly to the [facility] and check in at the registration area.   Wear comfortable clothing and clothing appropriate for easy access to any Portacath or PICC line.   We strive to give you quality time with your provider. You may need to reschedule your appointment if you arrive late (15 or more minutes).  Arriving late affects you and other patients whose appointments are after yours.  Also, if you miss three or more appointments without notifying the office, you may be dismissed from the clinic at the provider's discretion.      For prescription refill requests, have your pharmacy contact our office and allow 72 hours for refills to be completed.    Today you received the following chemotherapy and/or immunotherapy agents: pembrolizumab and pemetrexed      To help prevent nausea and vomiting after your treatment, we encourage you to take your nausea medication as directed.  BELOW ARE SYMPTOMS THAT SHOULD BE REPORTED IMMEDIATELY: *FEVER GREATER THAN 100.4 F (38 C) OR HIGHER *CHILLS OR SWEATING *NAUSEA AND VOMITING THAT IS NOT CONTROLLED WITH YOUR NAUSEA MEDICATION *UNUSUAL SHORTNESS OF BREATH *UNUSUAL BRUISING OR BLEEDING *URINARY PROBLEMS (pain or burning when urinating, or frequent urination) *BOWEL PROBLEMS (unusual diarrhea, constipation, pain near the anus) TENDERNESS IN MOUTH AND THROAT WITH OR WITHOUT PRESENCE OF ULCERS (sore throat, sores in mouth, or a toothache) UNUSUAL RASH, SWELLING OR PAIN  UNUSUAL VAGINAL DISCHARGE OR ITCHING   Items with * indicate a potential emergency and should be followed up as soon as possible or go to the Emergency Department if any problems should occur.  Please show the CHEMOTHERAPY ALERT CARD or IMMUNOTHERAPY ALERT  CARD at check-in to the Emergency Department and triage nurse.  Should you have questions after your visit or need to cancel or reschedule your appointment, please contact [facility]  Dept: [phone]  and follow the prompts.  Office hours are 8:00 a.m. to 4:30 p.m. Monday - Friday. Please note that voicemails left after 4:00 p.m. may not be returned until the following business day.  We are closed weekends and major holidays. You have access to a nurse at all times for urgent questions. Please call the main number to the clinic Dept: [phone] and follow the prompts.   For any non-urgent questions, you may also contact your provider using MyChart. We now offer e-Visits for anyone 37 and older to request care online for non-urgent symptoms. For details visit [email or web address].   Also download the MyChart app! Go to the app store, search "MyChart", open the app, select [facility], and log in with your MyChart username and password.  Masks are optional in the cancer centers. If you would like for your care team to wear a mask while they are taking care of you, please let them know. You may have one support person who is at least 65 years old accompany you for your appointments.

## 2022-05-30 ENCOUNTER — Other Ambulatory Visit: Payer: Self-pay | Admitting: *Deleted

## 2022-05-30 ENCOUNTER — Telehealth: Payer: Self-pay | Admitting: *Deleted

## 2022-05-30 ENCOUNTER — Telehealth: Payer: Self-pay

## 2022-05-30 MED ORDER — AMLODIPINE BESYLATE 10 MG PO TABS: 10.0000 mg | ORAL_TABLET | Freq: Every day | ORAL | 3 refills | Status: DC

## 2022-05-30 NOTE — Telephone Encounter (Signed)
Received call from pot's husband for refills on her Xtampza (last filled on 05/01/22 for 60 capsules) and for 90 days of amlopdipine. Please use [facility] on [address].  Amlodipine refill sent in. Dr. Lorenso Courier aware of Xtampza refill.

## 2022-05-30 NOTE — Telephone Encounter (Signed)
Patients spouse called.  States that patient needs a refill on Meclizine. He would like 90 tablets. Patient is still experiencing dizziness.  Routed to Provider

## 2022-05-31 ENCOUNTER — Other Ambulatory Visit (HOSPITAL_COMMUNITY): Payer: Self-pay

## 2022-05-31 MED ORDER — MECLIZINE HCL 25 MG PO TABS
25.0000 mg | ORAL_TABLET | Freq: Three times a day (TID) | ORAL | 0 refills | Status: DC | PRN
  Filled 2022-05-31: qty 30, 10d supply, fill #0

## 2022-05-31 NOTE — Addendum Note (Signed)
Addended by: Ventura Sellers on: 05/31/2022 12:51 PM   Modules accepted: Orders

## 2022-06-05 ENCOUNTER — Other Ambulatory Visit (HOSPITAL_COMMUNITY): Payer: Self-pay

## 2022-06-05 ENCOUNTER — Other Ambulatory Visit: Payer: Self-pay | Admitting: Hematology and Oncology

## 2022-06-05 MED ORDER — XTAMPZA ER 13.5 MG PO C12A: 1.0000 | EXTENDED_RELEASE_CAPSULE | Freq: Two times a day (BID) | ORAL | 0 refills | Status: DC

## 2022-06-06 ENCOUNTER — Encounter: Payer: Self-pay | Admitting: Internal Medicine

## 2022-06-06 ENCOUNTER — Encounter: Payer: Self-pay | Admitting: Hematology and Oncology

## 2022-06-06 NOTE — Progress Notes (Signed)
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 CT of the head showed showed concerning for metastatic disease involving the left cerebellum and right occipital lobe.  2) 09/30/2019: MRI brain confirms mass within the left cerebellum measures 1.6 x 1.3 x 1.5 cm as well as at least 8 metastatic lesions within the supratentorial and infratentorial brain as outlined 3) 10/01/2019: PET CT scan revealed hypermetabolic infrahilar right lower lobe mass with hypermetabolic nodal metastases in the right hilum, mediastinum, right supraclavicular region, left axilla and lower left neck. 4) 10/08/2019: US guided biopsy of the lymph nodes confirms poorly differentiated non-small cell carcinoma. Immunohistochemistry confirms adenocarcinoma of lung primary. PD-L1 and NGS later revealed a MET Exon 14 mutation and TPS of 90%.  5) 10/12/2019: Establish care with Dr. Lorenso Courier  6) 11/04/2019: Day 1 of capmatinib 427m BID 7) 11/09/2019: presented to Rad/Onc with a DVT in LLE. Seen in symptom management clinic and started on apixaban.  8) 12/29/2019: CT C/A/P showed interval decrease in size of mass involving the superior segment of right lower lobe and reduction in mediastinal and left supraclavicular adenopathy though some small spinal lesions were noted in the interim between the two sets of scans 9) 01/25/2020: temporarily stopped capmatinib due to symptoms of nausea/fatigue. Restarted therapy on 01/30/2020 after brief chemo holiday.  10)  03/22/2020: CT C/A/P reveals a mixed picture, with new lung nodule and increased lymphadenopathy, however response in bone lesions. D/c capmatinib therapy 11) 04/08/2020: start of Carbo/Pem/Pem for 2nd line treatment. Cycle 1 Day 1   12) 04/29/2020: Cycle 2 Day 1 Carbo/Pem/Pem  13) 05/20/2020: Cycle 3 Day 1 Carbo/Pem/Pem  14) 06/09/2020: Cycle 4 Day 1 Carbo/Pem/Pem  15) 06/29/2020: Cycle 5 Day 1 maintenance Pem/Pem  16) 07/20/2020: Cycle 6 Day 1 maintenance Pem/Pem  17) 08/12/2020: Cycle 7 Day 1 maintenance Pem/Pem  18) 09/01/2020: Cycle 8 Day 1 maintenance Pem/Pem  19) 09/21/2020: Cycle 9 Day 1 maintenance Pem/Pem  20) 10/13/2020: Cycle 10 Day 1 maintenance Pem/Pem  21) 11/03/2020: Cycle 11 Day 1 maintenance Pem/Pem  22) 11/25/2020:  Cycle 12 Day 1 maintenance Pem/Pem 23) 12/15/2020:  Cycle 13 Day 1 maintenance Pem/Pem 24) 01/05/2021:  Cycle 14 Day 1 maintenance Pem/Pem 25) 01/26/2021:  Cycle 15 Day 1 maintenance Pem/Pem plus Bevacizumab.  02/17/2021: Cycle 16 Day 1 maintenance Pem/Pem plus Bevacizumab.  03/10/2021: Cycle 17 Day 1 maintenance Pem/Pem plus Bevacizumab.  03/31/2021: Cycle 18 Day 1 maintenance Pem/Pem  04/20/2021:  Cycle 19 Day 1 maintenance Pem/Pem  05/12/2021: Cycle 20 Day 1 maintenance Pem/Pem 06/01/2021: Cycle 21 Day 1 maintenance Pem/Pem 06/22/2021: Cycle 22 Day 1 maintenance Pem/Pem 07/13/2021: Cycle 23 Day 1 maintenance Pem/Pem 08/16/2021: Cycle 24 Day 1 maintenance Pem/Pem 09/08/2021: Cycle 25 Day 1 maintenance Pem/Pem plus Bevacizumab 09/28/2021: Cycle 26 Day 1 maintenance Pem/Pem plus Bevacizumab 10/20/2021: Cycle 27 Day 1 maintenance Pem/Pem plus Bevacizumab 11/09/2021: Cycle 28 Day 1 maintenance Pem/Pem  12/06/2021: Cycle 29 Day 1 maintenance Pem/Pem  12/27/2021: Cycle 30 Day 1 maintenance Pem/Pem  01/18/2022: Cycle 31 Day 1 maintenance Pem/Pem  02/07/2022: Cycle 32 Day  1 maintenance Pem/Pem  02/28/2022: Cycle 33 Day 1 maintenance Pem/Pem  03/23/2022: Cycle 34 Day 1 maintenance Pem/Pem   04/13/2022: Cycle 35 Day 1 maintenance Pem/Pem  05/04/2022: Cycle 36 Day 1 maintenance Pem/Pem  05/25/2022: Cycle 37 Day 1 maintenance Pem/Pem   Interval History:  Virginia [name] 66 y.o. female with medical history significant for metastatic adenocarcinoma of the lung presents for a follow up visit. The patient's last visit was on 05/03/2022.  In the interim since the last visit, she continues on maintenance Pem/Pem.   On exam today Virginia [name] is accompanied by her husband.  She reports she has been having issues with feeling really dizzy.  She reports her pain is currently a 7 out of 10.  She has been discussing her dizziness with Dr. Mickeal Skinner.  She reports her appetite has been good.  She is currently taking 2 or 3 oxycodones per day with her regular Xtampza.  She has a good supply of the medication.   she has not been having any difficulty with fevers, chills, sweats, vomiting, or diarrhea.  Overall she is willing and able to proceed with treatment at this time.  She denies any fevers, chills, sweats, nausea, vomiting, diarrhea.  Full 10 point ROS was otherwise negative  Of note the patient plans on visiting [place] from November 14 until August 09, 2022.  MEDICAL HISTORY:  Past Medical History:  Diagnosis Date   Anemia    Anxiety    Concussion 09/28/2019   DVT (deep venous thrombosis) ([place]) 2021   L leg   Dyspnea    GERD (gastroesophageal reflux disease)    Hypercholesterolemia    per pt, she does not have elevated lipids   Hypertension    met lung ca dx'd 09/2019   mets to spine, hip and brain   PONV (postoperative nausea and vomiting)    Tobacco abuse     SURGICAL HISTORY: Past Surgical History:  Procedure Laterality Date   ABDOMINAL HYSTERECTOMY     partial/ left ovaries   APPLICATION OF CRANIAL NAVIGATION N/A 12/02/2020   Procedure: APPLICATION OF CRANIAL NAVIGATION;  Surgeon: Erline Levine, MD;  Location: [facility];  Service: Neurosurgery;  Laterality: N/A;   CHOLECYSTECTOMY      CRANIOTOMY N/A 12/02/2020   Procedure: Posterior fossa craniotomy for tumor resection with brainlab;  Surgeon: Erline Levine, MD;  Location: [facility];  Service: Neurosurgery;  Laterality: N/A;   DILATION AND CURETTAGE OF UTERUS     IR IMAGING GUIDED PORT INSERTION  10/23/2019   IR PATIENT EVAL TECH 0-60 MINS  11/21/2021   IR PATIENT EVAL TECH 0-60 MINS  11/24/2021   IR PATIENT EVAL TECH 0-60 MINS  11/29/2021   IR PATIENT EVAL TECH 0-60 MINS  12/04/2021   IR PATIENT EVAL TECH 0-60 MINS  12/12/2021   IR PATIENT EVAL TECH 0-60 MINS  12/19/2021   IR PATIENT EVAL TECH 0-60 MINS  12/27/2021   IR PATIENT EVAL TECH 0-60 MINS  01/03/2022   IR PATIENT EVAL TECH 0-60 MINS  01/10/2022   IR PATIENT EVAL TECH 0-60 MINS  01/18/2022   IR PATIENT EVAL TECH 0-60 MINS  02/13/2022   IR PATIENT EVAL TECH 0-60 MINS  02/20/2022   IR RADIOLOGIST EVAL & MGMT  01/23/2022   IR REMOVAL TUN ACCESS W/ PORT W/O FL MOD SED  11/17/2021   KYPHOPLASTY N/A 03/15/2020   Procedure: Thoracic Eight KYPHOPLASTY;  Surgeon: Erline Levine, MD;  Location: [facility];  Service: Neurosurgery;  Laterality:  N/A;  prone    LIPOMA EXCISION  2018   removed under left breast and right thigh.   TUBAL LIGATION      ALLERGIES:  has No Known Allergies.  MEDICATIONS:  Current Outpatient Medications  Medication Sig Dispense Refill   acetaminophen (TYLENOL) 500 MG tablet Take 500 mg by mouth 2 (two) times daily as needed for moderate pain.     albuterol (VENTOLIN HFA) 108 (90 Base) MCG/ACT inhaler INHALE 2 PUFFS INTO THE LUNGS EVERY 6 HOURS AS NEEDED FOR WHEEZING OR SHORTNESS OF BREATH 6.7 g 11   ALPRAZolam (XANAX) 0.25 MG tablet Take 1 tablet (0.25 mg total) by mouth 2 (two) times daily as needed for anxiety 60 tablet 1   amLODipine (NORVASC) 10 MG tablet Take 1 tablet (10 mg total) by mouth daily. 90 tablet 3   amoxicillin-clavulanate (AUGMENTIN) 500-125 MG tablet Take 1 tablet by mouth every 8 (eight) hours.     Cyanocobalamin (VITAMIN B12 PO) Take 1 tablet by  mouth daily. Gummies     EPINEPHrine 0.3 mg/0.3 mL IJ SOAJ injection Inject 0.3 mg into the muscle as needed for anaphylaxis. 2 each 0   folic acid (FOLVITE) 1 MG tablet Take 1 tablet by mouth daily. 90 tablet 1   furosemide (LASIX) 20 MG tablet Take 2 tablets (40 mg total) by mouth 2 times daily. 360 tablet 0   Glucosamine HCl (GLUCOSAMINE PO) Take 2 tablets by mouth daily.     lidocaine-prilocaine (EMLA) cream Apply to port as needed. 30 g 0   LYSINE PO Take 1 tablet by mouth daily.     meclizine (ANTIVERT) 25 MG tablet Take 1 tablet (25 mg total) by mouth 3 (three) times daily as needed for dizziness. 30 tablet 0   Multiple Vitamin (MULTIVITAMIN WITH MINERALS) TABS tablet Take 1 tablet by mouth daily.     Multiple Vitamins-Minerals (AIRBORNE PO) Take 1 tablet by mouth daily.      OLANZapine (ZYPREXA) 2.5 MG tablet TAKE 1 TABLET BY MOUTH AT BEDTIME 90 tablet 1   ondansetron (ZOFRAN) 4 MG tablet Take 1 tablet (4 mg total) by mouth every 8 (eight) hours as needed for nausea or vomiting. 40 tablet 2   OVER THE COUNTER MEDICATION Take 2 tablets by mouth daily. Beet root     oxyCODONE (OXY IR/ROXICODONE) 5 MG immediate release tablet TAKE 1 TO 2 TABLETS BY MOUTH EVERY 4 HOURS AS NEEDED FOR SEVERE PAIN 90 tablet 0   pantoprazole (PROTONIX) 40 MG tablet TAKE 1 TABLET BY MOUTH ONCE DAILY 90 tablet 3   polyethylene glycol (MIRALAX / GLYCOLAX) 17 g packet Take 17 g by mouth every evening.     potassium chloride SA (KLOR-CON M) 20 MEQ tablet Take 2 tablets (40 mEq total) by mouth 2 (two) times daily. 120 tablet 1   Tetrahydrozoline HCl (VISINE OP) Place 1 drop into both eyes daily as needed (irritation).     VITAMIN D PO Take 1 capsule by mouth daily.     vitamin E 180 MG (400 UNITS) capsule Take 400 IU daily x 1 week, then 400 IU BID (Patient taking differently: Take 400 Units by mouth daily.) 60 capsule 4   XTAMPZA ER 13.5 MG C12A Take 1 capsule by mouth in the morning and at bedtime. (every 12 hours)  60 capsule 0   No current facility-administered medications for this visit.    REVIEW OF SYSTEMS:   Constitutional: ( - ) fevers, ( - )  chills , ( - )  night sweats Eyes: ( - ) blurriness of vision, ( - ) double vision, ( - ) watery eyes Ears, nose, mouth, throat, and face: ( - ) mucositis, ( - ) sore throat Respiratory: ( - ) cough, ( + ) dyspnea, ( - ) wheezes Cardiovascular: ( - ) palpitation, ( - ) chest discomfort, ( - ) lower extremity swelling Gastrointestinal:  ( + ) nausea, ( - ) heartburn, ( - ) change in bowel habits Skin: ( - ) abnormal skin rashes Lymphatics: ( - ) new lymphadenopathy, ( - ) easy bruising Neurological: ( - ) numbness, ( - ) tingling, ( - ) new weaknesses Behavioral/Psych: ( - ) mood change, ( - ) new changes  All other systems were reviewed with the patient and are negative.  PHYSICAL EXAMINATION: ECOG PERFORMANCE STATUS: 1 - Symptomatic but completely ambulatory  Vitals:   05/25/22 1157  BP: (!) 121/93  Pulse: 99  Resp: 17  Temp: (!) 97.3 F (36.3 C)  SpO2: 97%      Filed Weights   05/25/22 1157  Weight: 147 lb 4.8 oz (66.8 kg)    GENERAL: well appearing middle aged Caucasian female in NAD  SKIN: skin color, texture, turgor are normal, no rashes or significant lesions EYES: conjunctiva are pink and non-injected, sclera clear LUNGS: wheezing at lung bases. clear to auscultation and percussion with normal breathing effort HEART: regular rate & rhythm and no murmurs and bilateral trace pitting lower extremity edema Musculoskeletal: no cyanosis of digits and no clubbing PSYCH: alert & oriented x 3, fluent speech NEURO: no focal motor/sensory deficits  LABORATORY DATA:  I have reviewed the data as listed    Latest Ref Rng & Units 05/25/2022   10:44 AM 05/03/2022   11:05 AM 04/13/2022   10:31 AM  CBC  WBC 4.0 - 10.5 K/uL 5.0  7.0  6.6   Hemoglobin 12.0 - 15.0 g/dL 12.3  11.7  13.4   Hematocrit 36.0 - 46.0 % 36.0  33.2  37.4   Platelets  150 - 400 K/uL 357  362  414        Latest Ref Rng & Units 05/25/2022   10:44 AM 05/03/2022   11:05 AM 04/13/2022   10:31 AM  CMP  Glucose 70 - 99 mg/dL 125  116  134   BUN 8 - 23 mg/dL _0 Creatinine 0.44 - 1.00 mg/dL 0.74  0.59  0.79   Sodium 135 - 145 mmol/L 137  139  138   Potassium 3.5 - 5.1 mmol/L 3.3  3.6  3.2   Chloride 98 - 111 mmol/L 101  101  99   CO2 22 - 32 mmol/L 29  32  31   Calcium 8.9 - 10.3 mg/dL 9.3  9.7  8.9   Total Protein 6.5 - 8.1 g/dL 7.1  6.7  7.4   Total Bilirubin 0.3 - 1.2 mg/dL 0.5  0.3  0.3   Alkaline Phos 38 - 126 U/L 58  62  65   AST 15 - 41 U/L _1 ALT 0 - 44 U/L _2 RADIOGRAPHIC STUDIES: I personally have viewed the radiographic studies below: No results found.   ASSESSMENT & PLAN Virginia [name] 66 y.o. female with medical history significant for metastatic adenocarcinoma of the lung presents for a follow up visit. [facility] testing has shown she has a MET  Exon 14 mutation and TPS of 90%.   Previously we discussed Carboplatin/Pembrolizumab/Pemetrexed and the expected side effect from these medications.  We discussed the possible side effects of immunotherapy including colitis, hepatitis, dermatitis, and pneumonitis.  Additionally we discussed the side effects of carboplatin which include suppression of blood counts, nephrotoxicity, and nausea.  Additionally we discussed pemetrexed which can also have effects on counts and would require supplementation with vitamin J47 and folic acid.  She voiced her understanding of these side effects and treatment plans moving forward.  I also noted that we would be able to drop the chemotherapy agents that she had intolerance to continue with pembrolizumab alone given her high TPS score.  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 Due to worsening dizziness, she resumed this from Cycle 25-27.  --repeat CT CAP on 12/11/2021 showed stable disease with no evidence of progression. Next due in July 2023.  PLAN: --Labs from today were reviewed and adequate for treatment.  Labs today show creatinine 0.74, white blood cell 5.0, hemoglobin 12.3, MCV 98.9, and platelets of 357 --Continue with Cycle 37, Day 1 of maintenance Pem/Pem. --RTC q 3 weeks for labs, follow up visit with Dr. Lorenso Courier prior to Cycle 38.    #Nausea/Vomiting, improving -- provided patient with Zofran 4-13m q8h PRN and compazine [id number] for breakthrough PRN.  She is currently using Phenergan for breakthrough. --continue olanzpaine 2.5 mg PO QHS to help with nausea. Marked improvement since the addition of this medication on 01/29/2020.  --continue to monitor   #Hypokalemia, stable --likely 2/2 to decreased PO intake, hypovolemia, and BP medications --Potassium level 3.3 today.  Gave 40 mEq of oral potassium chloride with treatment today.  --Currently on potassium chloride tablets, 40 mEq PO potassium in AM and 2068min PM. Due to difficulty swallowing tablets, we will try granules. Sent new prescription today.   #Pain Control, stable --continue oxycodone 5mg51mH PRN. Can increase dose to 10mg13m if pain is severe. She is taking approximately 2-3 pills per day.  --continue Xtampza 13.5 mg ER BID for long acting support.  --continue to take with Senokot to prevent opioid induced constipation. 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 Last 01/18/2022, next dose currently due.  --nausea as above    #Brain Metastasis, stable --appreciate the assistance of Dr. VasloMickeal Skinnerreating her brain metastasis and symptoms. --radiation therapy to the brain completed on 11/16/2019.  --MRI brain on 04/29/2020 showed  evidence of small brain bleed. Eliquis therapy was discontinued.  --MRI on 11/04/2020 showed concern for progression of intracranial disease.  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  --MRI on 12/23/2020 showed evidence of radiation necrosis. Dr. VasloMickeal Skinneruated the patient on 01/02/2021 with recommendations to add IV Bevacizumab 10 mg/kg q 3 weeks ( started 01/26/2021). Completed a full course of this therapy. --Patient was evaluated by Dr. VasloMickeal Skinner2/20/2022 and recommended to resume Bevacizumab due to worsening dizziness.  Which was given from Cycle 25-27.  Plan: --continue to follow with Dr. VasloMickeal Skinnervastin added back in the patient's chemotherapy regimen. --next brain MRI 08/10/2022.   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    # Hypertension: --Continue Norvasc to 10 mg daily.  --continue to monitor at home and in clinic.    No orders of the defined types were placed in this encounter.   All questions were answered. The patient knows to call the clinic with any problems, questions or concerns.  I have spent a total of 30 minutes minutes of face-to-face and non-face-to-face time, preparing to see the patient, performing a medically appropriate examination, counseling and educating the patient,  documenting clinical information in the electronic health record, and care coordination.   Ledell Peoples, MD Department of Hematology/Oncology [place] [facility] Phone: [phone] Pager: [phone] Email: [email or web address]   06/06/2022 12:32 PM   Literature Support:  Lorenza Chick, Rodrguez-Abreu D, Duffy Rhody, Felip E, De Angelis F, Domine M, [place], Hochmair MJ, [place], [place],  Bischoff HG, [place], Grossi F, Friend, Reck M, [place], [place], [place], Rubio-Viqueira B, Novello S, Kurata T, Gray JE, Vida J, Wei Z, Yang J, Raftopoulos H, [place], [place] [employer]; [id number] Investigators. Pembrolizumab plus Chemotherapy in Metastatic Non-Small-Cell Lung Cancer. [place] Med. 2018 May 31;378(22):2078-2092.  --Median progression-free survival was 8.8 months (95% CI, 7.6 to 9.2) in the pembrolizumab-combination group and 4.9 months (95% CI, 4.7 to 5.5) in the placebo-combination group (hazard ratio for disease progression or death, 0.52; 95% CI, 0.43 to 0.64; P<0.001). Adverse events of grade 3 or higher occurred in 67.2% of the patients in the pembrolizumab-combination group and in 65.8% of those in the placebo-combination group.  Jodene Nam L. Efficacy of pemetrexed-based regimens in advanced non-small cell lung cancer patients with activating epidermal growth factor receptor mutations after tyrosine kinase inhibitor failure: a systematic review. Onco Targets Ther. 2018;11:2121-2129.   --The weighted median PFS, median OS, and ORR for patients treated with pem regimens were 5.09 months, 15.91 months, and 30.19%, respectively. Our systematic review results showed a favorable efficacy profile of pem regimens in NSCLC patients with EGFR mutation after EGFR-TKI failure.

## 2022-06-13 ENCOUNTER — Other Ambulatory Visit: Payer: Self-pay | Admitting: *Deleted

## 2022-06-13 MED ORDER — POTASSIUM CHLORIDE CRYS ER 20 MEQ PO TBCR: 40.0000 meq | EXTENDED_RELEASE_TABLET | Freq: Two times a day (BID) | ORAL | 0 refills | Status: DC

## 2022-06-14 ENCOUNTER — Inpatient Hospital Stay: Payer: BC Managed Care – PPO

## 2022-06-14 ENCOUNTER — Other Ambulatory Visit: Payer: Self-pay | Admitting: *Deleted

## 2022-06-14 ENCOUNTER — Inpatient Hospital Stay: Payer: BC Managed Care – PPO | Attending: Hematology and Oncology | Admitting: Hematology and Oncology

## 2022-06-14 ENCOUNTER — Inpatient Hospital Stay: Payer: BC Managed Care – PPO | Admitting: Hematology and Oncology

## 2022-06-14 VITALS — BP 115/90 | HR 104 | Resp 18

## 2022-06-14 DIAGNOSIS — Z79899 Other long term (current) drug therapy: Secondary | ICD-10-CM | POA: Diagnosis not present

## 2022-06-14 DIAGNOSIS — R42 Dizziness and giddiness: Secondary | ICD-10-CM | POA: Diagnosis not present

## 2022-06-14 DIAGNOSIS — C7931 Secondary malignant neoplasm of brain: Secondary | ICD-10-CM

## 2022-06-14 DIAGNOSIS — I10 Essential (primary) hypertension: Secondary | ICD-10-CM | POA: Diagnosis not present

## 2022-06-14 DIAGNOSIS — R0600 Dyspnea, unspecified: Secondary | ICD-10-CM | POA: Diagnosis not present

## 2022-06-14 DIAGNOSIS — Z5111 Encounter for antineoplastic chemotherapy: Secondary | ICD-10-CM | POA: Diagnosis not present

## 2022-06-14 DIAGNOSIS — R918 Other nonspecific abnormal finding of lung field: Secondary | ICD-10-CM

## 2022-06-14 DIAGNOSIS — Z23 Encounter for immunization: Secondary | ICD-10-CM

## 2022-06-14 DIAGNOSIS — C7951 Secondary malignant neoplasm of bone: Secondary | ICD-10-CM | POA: Insufficient documentation

## 2022-06-14 DIAGNOSIS — R112 Nausea with vomiting, unspecified: Secondary | ICD-10-CM | POA: Insufficient documentation

## 2022-06-14 DIAGNOSIS — C3431 Malignant neoplasm of lower lobe, right bronchus or lung: Secondary | ICD-10-CM | POA: Diagnosis not present

## 2022-06-14 DIAGNOSIS — Z86718 Personal history of other venous thrombosis and embolism: Secondary | ICD-10-CM | POA: Diagnosis not present

## 2022-06-14 DIAGNOSIS — Z9049 Acquired absence of other specified parts of digestive tract: Secondary | ICD-10-CM | POA: Insufficient documentation

## 2022-06-14 DIAGNOSIS — Z5112 Encounter for antineoplastic immunotherapy: Secondary | ICD-10-CM | POA: Insufficient documentation

## 2022-06-14 DIAGNOSIS — R110 Nausea: Secondary | ICD-10-CM | POA: Diagnosis not present

## 2022-06-14 DIAGNOSIS — Z7901 Long term (current) use of anticoagulants: Secondary | ICD-10-CM | POA: Insufficient documentation

## 2022-06-14 DIAGNOSIS — M549 Dorsalgia, unspecified: Secondary | ICD-10-CM | POA: Diagnosis not present

## 2022-06-14 DIAGNOSIS — E876 Hypokalemia: Secondary | ICD-10-CM | POA: Diagnosis not present

## 2022-06-14 LAB — CMP (CANCER CENTER ONLY)
ALT: 15 U/L (ref 0–44)
AST: 21 U/L (ref 15–41)
Albumin: 3.6 g/dL (ref 3.5–5.0)
Alkaline Phosphatase: 65 U/L (ref 38–126)
Anion gap: 8 (ref 5–15)
BUN: 8 mg/dL (ref 8–23)
CO2: 30 mmol/L (ref 22–32)
Calcium: 9.7 mg/dL (ref 8.9–10.3)
Chloride: 101 mmol/L (ref 98–111)
Creatinine: 0.78 mg/dL (ref 0.44–1.00)
GFR, Estimated: >60 mL/min (ref >60)
Glucose, Bld: 129 mg/dL — ABNORMAL HIGH (ref 70–99)
Potassium: 3.4 mmol/L — ABNORMAL LOW (ref 3.5–5.1)
Sodium: 139 mmol/L (ref 135–145)
Total Bilirubin: 0.4 mg/dL (ref 0.3–1.2)
Total Protein: 7.7 g/dL (ref 6.5–8.1)

## 2022-06-14 LAB — CBC WITH DIFFERENTIAL (CANCER CENTER ONLY)
Abs Immature Granulocytes: 0.01 10*3/uL (ref 0.00–0.07)
Basophils Absolute: 0 10*3/uL (ref 0.0–0.1)
Basophils Relative: 1 %
Eosinophils Absolute: 0.2 10*3/uL (ref 0.0–0.5)
Eosinophils Relative: 3 %
HCT: 36.1 % (ref 36.0–46.0)
Hemoglobin: 12.9 g/dL (ref 12.0–15.0)
Immature Granulocytes: 0 %
Lymphocytes Relative: 34 %
Lymphs Abs: 2 10*3/uL (ref 0.7–4.0)
MCH: 34.9 pg — ABNORMAL HIGH (ref 26.0–34.0)
MCHC: 35.7 g/dL (ref 30.0–36.0)
MCV: 97.6 fL (ref 80.0–100.0)
Monocytes Absolute: 0.9 10*3/uL (ref 0.1–1.0)
Monocytes Relative: 16 %
Neutro Abs: 2.7 10*3/uL (ref 1.7–7.7)
Neutrophils Relative %: 46 %
Platelet Count: 464 10*3/uL — ABNORMAL HIGH (ref 150–400)
RBC: 3.7 MIL/uL — ABNORMAL LOW (ref 3.87–5.11)
RDW: 14.2 % (ref 11.5–15.5)
WBC Count: 5.9 10*3/uL (ref 4.0–10.5)
nRBC: 0 % (ref 0.0–0.2)

## 2022-06-14 LAB — TSH: TSH: 2.59 u[IU]/mL (ref 0.350–4.500)

## 2022-06-14 MED ORDER — INFLUENZA VAC SPLIT QUAD 0.5 ML IM SUSY
0.5000 mL | PREFILLED_SYRINGE | Freq: Once | INTRAMUSCULAR | Status: AC
  Administered 2022-06-14: 0.5 mL via INTRAMUSCULAR
  Filled 2022-06-14: qty 0.5

## 2022-06-14 MED ORDER — SODIUM CHLORIDE 0.9 % IV SOLN
500.0000 mg/m2 | Freq: Once | INTRAVENOUS | Status: AC
  Administered 2022-06-14: 900 mg via INTRAVENOUS
  Filled 2022-06-14: qty 20

## 2022-06-14 MED ORDER — SODIUM CHLORIDE 0.9 % IV SOLN: Freq: Once | INTRAVENOUS | Status: AC

## 2022-06-14 MED ORDER — SODIUM CHLORIDE 0.9 % IV SOLN
200.0000 mg | Freq: Once | INTRAVENOUS | Status: AC
  Administered 2022-06-14: 200 mg via INTRAVENOUS
  Filled 2022-06-14: qty 200

## 2022-06-14 MED ORDER — PROCHLORPERAZINE MALEATE 10 MG PO TABS
10.0000 mg | ORAL_TABLET | Freq: Once | ORAL | Status: AC
  Administered 2022-06-14: 10 mg via ORAL
  Filled 2022-06-14: qty 1

## 2022-06-14 MED ORDER — CYANOCOBALAMIN 1000 MCG/ML IJ SOLN
1000.0000 ug | Freq: Once | INTRAMUSCULAR | Status: AC
  Administered 2022-06-14: 1000 ug via INTRAMUSCULAR
  Filled 2022-06-14: qty 1

## 2022-06-14 NOTE — Progress Notes (Signed)
B 12

## 2022-06-14 NOTE — Patient Instructions (Signed)
[facility] ONCOL[facility]   Discharge Instructions: Thank you for choosing [facility] to provide your oncology and hematology care.   If you have a lab appointment with the [facility], please go directly to the [facility] and check in at the registration area.   Wear comfortable clothing and clothing appropriate for easy access to any Portacath or PICC line.   We strive to give you quality time with your provider. You may need to reschedule your appointment if you arrive late (15 or more minutes).  Arriving late affects you and other patients whose appointments are after yours.  Also, if you miss three or more appointments without notifying the office, you may be dismissed from the clinic at the provider's discretion.      For prescription refill requests, have your pharmacy contact our office and allow 72 hours for refills to be completed.    Today you received the following chemotherapy and/or immunotherapy agents: pembrolizumab and pemetrexed      To help prevent nausea and vomiting after your treatment, we encourage you to take your nausea medication as directed.  BELOW ARE SYMPTOMS THAT SHOULD BE REPORTED IMMEDIATELY: *FEVER GREATER THAN 100.4 F (38 C) OR HIGHER *CHILLS OR SWEATING *NAUSEA AND VOMITING THAT IS NOT CONTROLLED WITH YOUR NAUSEA MEDICATION *UNUSUAL SHORTNESS OF BREATH *UNUSUAL BRUISING OR BLEEDING *URINARY PROBLEMS (pain or burning when urinating, or frequent urination) *BOWEL PROBLEMS (unusual diarrhea, constipation, pain near the anus) TENDERNESS IN MOUTH AND THROAT WITH OR WITHOUT PRESENCE OF ULCERS (sore throat, sores in mouth, or a toothache) UNUSUAL RASH, SWELLING OR PAIN  UNUSUAL VAGINAL DISCHARGE OR ITCHING   Items with * indicate a potential emergency and should be followed up as soon as possible or go to the Emergency Department if any problems should occur.  Please show the CHEMOTHERAPY ALERT CARD or IMMUNOTHERAPY ALERT  CARD at check-in to the Emergency Department and triage nurse.  Should you have questions after your visit or need to cancel or reschedule your appointment, please contact [facility]  Dept: [phone]  and follow the prompts.  Office hours are 8:00 a.m. to 4:30 p.m. Monday - Friday. Please note that voicemails left after 4:00 p.m. may not be returned until the following business day.  We are closed weekends and major holidays. You have access to a nurse at all times for urgent questions. Please call the main number to the clinic Dept: [phone] and follow the prompts.   For any non-urgent questions, you may also contact your provider using MyChart. We now offer e-Visits for anyone 59 and older to request care online for non-urgent symptoms. For details visit [email or web address].   Also download the MyChart app! Go to the app store, search "MyChart", open the app, select [facility], and log in with your MyChart username and password.  Masks are optional in the cancer centers. If you would like for your care team to wear a mask while they are taking care of you, please let them know. You may have one support person who is at least 65 years old accompany you for your appointments.

## 2022-06-14 NOTE — Progress Notes (Signed)
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 CT of the head showed showed concerning for metastatic disease involving the left cerebellum and right occipital lobe.  2) 09/30/2019: MRI brain confirms mass within the left cerebellum measures 1.6 x 1.3 x 1.5 cm as well as at least 8 metastatic lesions within the supratentorial and infratentorial brain as outlined 3) 10/01/2019: PET CT scan revealed hypermetabolic infrahilar right lower lobe mass with hypermetabolic nodal metastases in the right hilum, mediastinum, right supraclavicular region, left axilla and lower left neck. 4) 10/08/2019: US guided biopsy of the lymph nodes confirms poorly differentiated non-small cell carcinoma. Immunohistochemistry confirms adenocarcinoma of lung primary. PD-L1 and NGS later revealed a MET Exon 14 mutation and TPS of 90%.  5) 10/12/2019: Establish care with Dr. Lorenso Courier  6) 11/04/2019: Day 1 of capmatinib 427m BID 7) 11/09/2019: presented to Rad/Onc with a DVT in LLE. Seen in symptom management clinic and started on apixaban.  8) 12/29/2019: CT C/A/P showed interval decrease in size of mass involving the superior segment of right lower lobe and reduction in mediastinal and left supraclavicular adenopathy though some small spinal lesions were noted in the interim between the two sets of scans 9) 01/25/2020: temporarily stopped capmatinib due to symptoms of nausea/fatigue. Restarted therapy on 01/30/2020 after brief chemo holiday.  10)  03/22/2020: CT C/A/P reveals a mixed picture, with new lung nodule and increased lymphadenopathy, however response in bone lesions. D/c capmatinib therapy 11) 04/08/2020: start of Carbo/Pem/Pem for 2nd line treatment. Cycle 1 Day 1   12) 04/29/2020: Cycle 2 Day 1 Carbo/Pem/Pem  13) 05/20/2020: Cycle 3 Day 1 Carbo/Pem/Pem  14) 06/09/2020: Cycle 4 Day 1 Carbo/Pem/Pem  15) 06/29/2020: Cycle 5 Day 1 maintenance Pem/Pem  16) 07/20/2020: Cycle 6 Day 1 maintenance Pem/Pem  17) 08/12/2020: Cycle 7 Day 1 maintenance Pem/Pem  18) 09/01/2020: Cycle 8 Day 1 maintenance Pem/Pem  19) 09/21/2020: Cycle 9 Day 1 maintenance Pem/Pem  20) 10/13/2020: Cycle 10 Day 1 maintenance Pem/Pem  21) 11/03/2020: Cycle 11 Day 1 maintenance Pem/Pem  22) 11/25/2020:  Cycle 12 Day 1 maintenance Pem/Pem 23) 12/15/2020:  Cycle 13 Day 1 maintenance Pem/Pem 24) 01/05/2021:  Cycle 14 Day 1 maintenance Pem/Pem 25) 01/26/2021:  Cycle 15 Day 1 maintenance Pem/Pem plus Bevacizumab.  02/17/2021: Cycle 16 Day 1 maintenance Pem/Pem plus Bevacizumab.  03/10/2021: Cycle 17 Day 1 maintenance Pem/Pem plus Bevacizumab.  03/31/2021: Cycle 18 Day 1 maintenance Pem/Pem  04/20/2021:  Cycle 19 Day 1 maintenance Pem/Pem  05/12/2021: Cycle 20 Day 1 maintenance Pem/Pem 06/01/2021: Cycle 21 Day 1 maintenance Pem/Pem 06/22/2021: Cycle 22 Day 1 maintenance Pem/Pem 07/13/2021: Cycle 23 Day 1 maintenance Pem/Pem 08/16/2021: Cycle 24 Day 1 maintenance Pem/Pem 09/08/2021: Cycle 25 Day 1 maintenance Pem/Pem plus Bevacizumab 09/28/2021: Cycle 26 Day 1 maintenance Pem/Pem plus Bevacizumab 10/20/2021: Cycle 27 Day 1 maintenance Pem/Pem plus Bevacizumab 11/09/2021: Cycle 28 Day 1 maintenance Pem/Pem  12/06/2021: Cycle 29 Day 1 maintenance Pem/Pem  12/27/2021: Cycle 30 Day 1 maintenance Pem/Pem  01/18/2022: Cycle 31 Day 1 maintenance Pem/Pem  02/07/2022: Cycle 32 Day  1 maintenance Pem/Pem  02/28/2022: Cycle 33 Day 1 maintenance Pem/Pem  03/23/2022: Cycle 34 Day 1 maintenance Pem/Pem   04/13/2022: Cycle 35 Day 1 maintenance Pem/Pem  05/04/2022: Cycle 36 Day 1 maintenance Pem/Pem  05/25/2022: Cycle 37 Day 1 maintenance Pem/Pem  06/14/2022: Cycle 38 Day 1 maintenance Pem/Pem  Interval History:  Virginia [name] 66 y.o. female with medical history significant for metastatic adenocarcinoma of the lung presents for a follow up visit. The patient's last visit was on 05/25/2022.  In the interim since the last visit, she continues on maintenance Pem/Pem.   On exam today Mrs. Maynez is accompanied by her husband.  She reports her major symptom today is dizziness.  She reports that she recently had a dental procedure last Thursday and is currently on ampicillin 500 mg for the next 2 weeks.  She tolerated the dental procedure well and is not currently having any pain.  Her appetite is decreased however and she is having some nausea with the dizziness.  Unfortunately her weight continues to decline and she is down to 142 pounds from 151 pounds in August.  She reports she does not have any overt signs of bleeding, bruising, or dark stools.  She is otherwise tolerating treatment quite well and is willing and able to proceed at this time.  She denies any fevers, chills, sweats, vomiting, diarrhea.  Full 10 point ROS was otherwise negative  Of note the patient plans on visiting [place] from November 14 until August 09, 2022.  MEDICAL HISTORY:  Past Medical History:  Diagnosis Date   Anemia    Anxiety    Concussion 09/28/2019   DVT (deep venous thrombosis) ([place]) 2021   L leg   Dyspnea    GERD (gastroesophageal reflux disease)    Hypercholesterolemia    per pt, she does not have elevated lipids   Hypertension    met lung ca dx'd 09/2019   mets to spine, hip and brain   PONV (postoperative nausea and vomiting)    Tobacco abuse     SURGICAL HISTORY: Past Surgical History:  Procedure Laterality Date   ABDOMINAL HYSTERECTOMY     partial/ left ovaries   APPLICATION OF CRANIAL NAVIGATION  N/A 12/02/2020   Procedure: APPLICATION OF CRANIAL NAVIGATION;  Surgeon: Erline Levine, MD;  Location: [facility];  Service: Neurosurgery;  Laterality: N/A;   CHOLECYSTECTOMY     CRANIOTOMY N/A 12/02/2020   Procedure: Posterior fossa craniotomy for tumor resection with brainlab;  Surgeon: Erline Levine, MD;  Location: [facility];  Service: Neurosurgery;  Laterality: N/A;   DILATION AND CURETTAGE OF UTERUS     IR IMAGING GUIDED PORT INSERTION  10/23/2019   IR PATIENT EVAL TECH 0-60 MINS  11/21/2021   IR PATIENT EVAL TECH 0-60 MINS  11/24/2021   IR PATIENT EVAL TECH 0-60 MINS  11/29/2021   IR PATIENT EVAL TECH 0-60 MINS  12/04/2021   IR PATIENT EVAL TECH 0-60 MINS  12/12/2021   IR PATIENT EVAL TECH 0-60 MINS  12/19/2021   IR PATIENT EVAL TECH 0-60 MINS  12/27/2021   IR PATIENT EVAL TECH 0-60 MINS  01/03/2022   IR PATIENT EVAL TECH 0-60 MINS  01/10/2022   IR PATIENT EVAL TECH 0-60 MINS  01/18/2022   IR PATIENT EVAL TECH 0-60 MINS  02/13/2022   IR PATIENT EVAL TECH 0-60 MINS  02/20/2022   IR RADIOLOGIST EVAL & MGMT  01/23/2022   IR REMOVAL TUN ACCESS W/ PORT W/O FL MOD SED  11/17/2021  KYPHOPLASTY N/A 03/15/2020   Procedure: Thoracic Eight KYPHOPLASTY;  Surgeon: Erline Levine, MD;  Location: [facility];  Service: Neurosurgery;  Laterality: N/A;  prone    LIPOMA EXCISION  2018   removed under left breast and right thigh.   TUBAL LIGATION      ALLERGIES:  has No Known Allergies.  MEDICATIONS:  Current Outpatient Medications  Medication Sig Dispense Refill   acetaminophen (TYLENOL) 500 MG tablet Take 500 mg by mouth 2 (two) times daily as needed for moderate pain.     albuterol (VENTOLIN HFA) 108 (90 Base) MCG/ACT inhaler INHALE 2 PUFFS INTO THE LUNGS EVERY 6 HOURS AS NEEDED FOR WHEEZING OR SHORTNESS OF BREATH 6.7 g 11   ALPRAZolam (XANAX) 0.25 MG tablet Take 1 tablet (0.25 mg total) by mouth 2 (two) times daily as needed for anxiety 60 tablet 1   amLODipine (NORVASC) 10 MG tablet Take 1 tablet (10 mg total) by mouth  daily. 90 tablet 3   amoxicillin-clavulanate (AUGMENTIN) 500-125 MG tablet Take 1 tablet by mouth every 8 (eight) hours.     Cyanocobalamin (VITAMIN B12 PO) Take 1 tablet by mouth daily. Gummies     EPINEPHrine 0.3 mg/0.3 mL IJ SOAJ injection Inject 0.3 mg into the muscle as needed for anaphylaxis. 2 each 0   folic acid (FOLVITE) 1 MG tablet Take 1 tablet by mouth daily. 90 tablet 1   furosemide (LASIX) 20 MG tablet Take 2 tablets (40 mg total) by mouth 2 times daily. 360 tablet 0   Glucosamine HCl (GLUCOSAMINE PO) Take 2 tablets by mouth daily.     lidocaine-prilocaine (EMLA) cream Apply to port as needed. 30 g 0   LYSINE PO Take 1 tablet by mouth daily.     meclizine (ANTIVERT) 25 MG tablet Take 1 tablet (25 mg total) by mouth 3 (three) times daily as needed for dizziness. 30 tablet 0   Multiple Vitamin (MULTIVITAMIN WITH MINERALS) TABS tablet Take 1 tablet by mouth daily.     Multiple Vitamins-Minerals (AIRBORNE PO) Take 1 tablet by mouth daily.      OLANZapine (ZYPREXA) 2.5 MG tablet TAKE 1 TABLET BY MOUTH AT BEDTIME 90 tablet 1   ondansetron (ZOFRAN) 4 MG tablet Take 1 tablet (4 mg total) by mouth every 8 (eight) hours as needed for nausea or vomiting. 40 tablet 2   OVER THE COUNTER MEDICATION Take 2 tablets by mouth daily. Beet root     oxyCODONE (OXY IR/ROXICODONE) 5 MG immediate release tablet TAKE 1 TO 2 TABLETS BY MOUTH EVERY 4 HOURS AS NEEDED FOR SEVERE PAIN 90 tablet 0   pantoprazole (PROTONIX) 40 MG tablet TAKE 1 TABLET BY MOUTH ONCE DAILY 90 tablet 3   polyethylene glycol (MIRALAX / GLYCOLAX) 17 g packet Take 17 g by mouth every evening.     potassium chloride SA (KLOR-CON M) 20 MEQ tablet Take 2 tablets (40 mEq total) by mouth 2 (two) times daily. 360 tablet 0   Tetrahydrozoline HCl (VISINE OP) Place 1 drop into both eyes daily as needed (irritation).     VITAMIN D PO Take 1 capsule by mouth daily.     vitamin E 180 MG (400 UNITS) capsule Take 400 IU daily x 1 week, then 400 IU  BID (Patient taking differently: Take 400 Units by mouth daily.) 60 capsule 4   XTAMPZA ER 13.5 MG C12A Take 1 capsule by mouth in the morning and at bedtime. (every 12 hours) 60 capsule 0   No current facility-administered  medications for this visit.    REVIEW OF SYSTEMS:   Constitutional: ( - ) fevers, ( - )  chills , ( - ) night sweats Eyes: ( - ) blurriness of vision, ( - ) double vision, ( - ) watery eyes Ears, nose, mouth, throat, and face: ( - ) mucositis, ( - ) sore throat Respiratory: ( - ) cough, ( + ) dyspnea, ( - ) wheezes Cardiovascular: ( - ) palpitation, ( - ) chest discomfort, ( - ) lower extremity swelling Gastrointestinal:  ( + ) nausea, ( - ) heartburn, ( - ) change in bowel habits Skin: ( - ) abnormal skin rashes Lymphatics: ( - ) new lymphadenopathy, ( - ) easy bruising Neurological: ( - ) numbness, ( - ) tingling, ( - ) new weaknesses Behavioral/Psych: ( - ) mood change, ( - ) new changes  All other systems were reviewed with the patient and are negative.  PHYSICAL EXAMINATION: ECOG PERFORMANCE STATUS: 1 - Symptomatic but completely ambulatory  Vitals:   06/14/22 1148  BP: 123/71  Pulse: (!) 107  Resp: 16  Temp: 98 F (36.7 C)  SpO2: 99%      Filed Weights   06/14/22 1148  Weight: 142 lb 14.4 oz (64.8 kg)    GENERAL: well appearing middle aged Caucasian female in NAD  SKIN: skin color, texture, turgor are normal, no rashes or significant lesions EYES: conjunctiva are pink and non-injected, sclera clear LUNGS: wheezing at lung bases. clear to auscultation and percussion with normal breathing effort HEART: regular rate & rhythm and no murmurs and bilateral trace pitting lower extremity edema Musculoskeletal: no cyanosis of digits and no clubbing PSYCH: alert & oriented x 3, fluent speech NEURO: no focal motor/sensory deficits  LABORATORY DATA:  I have reviewed the data as listed    Latest Ref Rng & Units 06/14/2022   11:06 AM 05/25/2022   10:44  AM 05/03/2022   11:05 AM  CBC  WBC 4.0 - 10.5 K/uL 5.9  5.0  7.0   Hemoglobin 12.0 - 15.0 g/dL 12.9  12.3  11.7   Hematocrit 36.0 - 46.0 % 36.1  36.0  33.2   Platelets 150 - 400 K/uL 464  357  362        Latest Ref Rng & Units 06/14/2022   11:06 AM 05/25/2022   10:44 AM 05/03/2022   11:05 AM  CMP  Glucose 70 - 99 mg/dL 129  125  116   BUN 8 - 23 mg/dL _0 Creatinine 0.44 - 1.00 mg/dL 0.78  0.74  0.59   Sodium 135 - 145 mmol/L 139  137  139   Potassium 3.5 - 5.1 mmol/L 3.4  3.3  3.6   Chloride 98 - 111 mmol/L 101  101  101   CO2 22 - 32 mmol/L 30  29  32   Calcium 8.9 - 10.3 mg/dL 9.7  9.3  9.7   Total Protein 6.5 - 8.1 g/dL 7.7  7.1  6.7   Total Bilirubin 0.3 - 1.2 mg/dL 0.4  0.5  0.3   Alkaline Phos 38 - 126 U/L 65  58  62   AST 15 - 41 U/L _1 ALT 0 - 44 U/L _2 RADIOGRAPHIC STUDIES: I personally have viewed the radiographic studies below: No results found.   ASSESSMENT & PLAN UNNAMED HINO 66 y.o. female  with medical history significant for metastatic adenocarcinoma of the lung presents for a follow up visit. 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   Previously we discussed Carboplatin/Pembrolizumab/Pemetrexed and the expected side effect from these medications.  We discussed the possible side effects of immunotherapy including colitis, hepatitis, dermatitis, and pneumonitis.  Additionally we discussed the side effects of carboplatin which include suppression of blood counts, nephrotoxicity, and nausea.  Additionally we discussed pemetrexed which can also have effects on counts and would require supplementation with vitamin M62 and folic acid.  She voiced her understanding of these side effects and treatment plans moving forward.  I also noted that we would be able to drop the chemotherapy agents that she had intolerance to continue with pembrolizumab alone given her high TPS score.  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 Due to worsening dizziness, she resumed this from Cycle 25-27.  --repeat CT CAP on 12/11/2021 showed stable disease with no evidence of progression. Next due in July 2023.  PLAN: --Labs from today were reviewed and adequate for treatment.  Labs today show creatinine 0.74, white blood cell 5.0, hemoglobin 12.3, MCV 98.9, and platelets of 357 --Continue with Cycle 37, Day 1 of maintenance Pem/Pem. --RTC q 3 weeks for labs, follow up visit with Dr. Lorenso Courier prior to Cycle 38.    #Nausea/Vomiting, improving -- provided patient with Zofran 4-19m q8h PRN and compazine 124mq6H for breakthrough PRN.  She is currently using Phenergan for breakthrough. --continue olanzpaine 2.5 mg PO QHS to help with nausea. Marked improvement since the addition of this medication on 01/29/2020.  --continue to monitor   #Hypokalemia, stable --likely 2/2 to decreased PO intake, hypovolemia, and BP medications --Potassium level 3.3 today.  Gave 40 mEq of oral potassium chloride with treatment today.  --Currently on potassium chloride tablets, 40 mEq PO potassium in AM and 2019min PM. Due to difficulty swallowing tablets, we will try granules. Sent new prescription today.   #Pain Control, stable --continue oxycodone 5mg62mH PRN. Can increase dose to 10mg49m if pain is severe. She is taking approximately 2-3 pills per day.  --continue Xtampza 13.5 mg ER BID for long acting support.  --continue to take with Senokot to prevent opioid induced constipation. 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 Last 01/18/2022, next dose currently due.  --nausea as above    #Brain Metastasis,  stable --appreciate the assistance of Dr. VasloMickeal Skinnerreating her brain metastasis and symptoms. --radiation therapy to the brain completed on 11/16/2019.  --MRI brain on 04/29/2020 showed evidence of small brain bleed. Eliquis therapy was discontinued.  --MRI on 11/04/2020 showed concern for progression of intracranial disease.  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  --MRI on 12/23/2020 showed evidence of radiation necrosis. Dr. VasloMickeal Skinneruated the patient on 01/02/2021 with recommendations to add IV Bevacizumab 10 mg/kg q 3 weeks ( started 01/26/2021). Completed a full course of this therapy. --Patient was evaluated by Dr. VasloMickeal Skinner2/20/2022 and recommended to resume Bevacizumab due to worsening dizziness.  Which was given from Cycle 25-27.  Plan: --continue to follow with  Dr. Mickeal Skinner  --Avastin added back in the patient's chemotherapy regimen. Will restart in 4 weeks from today (out from dental procedure).  --next brain MRI 08/10/2022.   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    # Hypertension: --Continue Norvasc to 10 mg daily.  --continue to monitor at home and in clinic.    Orders Placed This Encounter  Procedures   CT CHEST ABDOMEN PELVIS W CONTRAST    Standing Status:   Future    Standing Expiration Date:   06/15/2023    Order Specific Question:   Preferred imaging location?    Answer:   [facility]    Order Specific Question:   Is Oral Contrast requested for this exam?    Answer:   Yes, Per Radiology protocol    All questions were answered. The patient knows to call the clinic with any problems, questions or concerns.  I have spent a total of 30 minutes minutes of face-to-face and non-face-to-face time, preparing to see the  patient, performing a medically appropriate examination, counseling and educating the patient,  documenting clinical information in the electronic health record, and care coordination.   Ledell Peoples, MD Department of Hematology/Oncology [place] [facility] Phone: [phone] Pager: [phone] Email: [email or web address]   06/14/2022 5:40 PM   Literature Support:  Lorenza Chick, Rodrguez-Abreu D, Duffy Rhody, Felip E, De Angelis F, Domine M, [place], Hochmair MJ, [place], [place], Bischoff HG, [place], Grossi F, [place], Reck M, [place], [place], [place], Rubio-Viqueira B, Novello S, Kurata T, Gray JE, Vida J, Wei Z, Yang J, Raftopoulos H, [place], [place] [employer]; [id number] Investigators. Pembrolizumab plus Chemotherapy in Metastatic Non-Small-Cell Lung Cancer. [place] Med. 2018 May 31;378(22):2078-2092.  --Median progression-free survival was 8.8 months (95% CI, 7.6 to 9.2) in the pembrolizumab-combination group and 4.9 months (95% CI, 4.7 to 5.5) in the placebo-combination group (hazard ratio for disease progression or death, 0.52; 95% CI, 0.43 to 0.64; P<0.001). Adverse events of grade 3 or higher occurred in 67.2% of the patients in the pembrolizumab-combination group and in 65.8% of those in the placebo-combination group.  Jodene Nam L. Efficacy of pemetrexed-based regimens in advanced non-small cell lung cancer patients with activating epidermal growth factor receptor mutations after tyrosine kinase inhibitor failure: a systematic review. Onco Targets Ther. 2018;11:2121-2129.   --The weighted median PFS, median OS, and ORR for patients treated with pem regimens were 5.09 months, 15.91 months, and 30.19%, respectively. Our systematic review results showed a favorable efficacy profile of pem regimens in NSCLC patients with EGFR mutation after EGFR-TKI failure.

## 2022-06-14 NOTE — Progress Notes (Signed)
Per Dr. Mickeal Skinner- no Bevacizumab today due to dental procedure last Thursday.

## 2022-06-15 ENCOUNTER — Telehealth: Payer: Self-pay

## 2022-06-15 NOTE — Telephone Encounter (Signed)
Pt's husband called in requesting something "stronger" for pt's vertigo. He stated that the meclizine is not really working. He was wanting to know if there is anything else that would work better.

## 2022-06-29 ENCOUNTER — Telehealth: Payer: Self-pay

## 2022-06-29 NOTE — Telephone Encounter (Signed)
Pt's husband called and left a message requesting a call regarding pt's "dizziness" and pt's next Avastin tx. Pt's husband is requesting something stronger for the dizziness and wanted to know what other options they have. Pt's husband was also asking when they will the Avastin get restarted and about side effects. Let pt's husband know I will send this to Dr Mickeal Skinner for input.

## 2022-07-02 ENCOUNTER — Encounter: Payer: Self-pay | Admitting: Internal Medicine

## 2022-07-02 ENCOUNTER — Telehealth: Payer: Self-pay | Admitting: *Deleted

## 2022-07-02 ENCOUNTER — Other Ambulatory Visit: Payer: Self-pay | Admitting: Hematology and Oncology

## 2022-07-02 ENCOUNTER — Encounter: Payer: Self-pay | Admitting: Hematology and Oncology

## 2022-07-02 MED ORDER — OXYCODONE HCL 5 MG PO TABS: ORAL_TABLET | ORAL | 0 refills | Status: DC

## 2022-07-02 MED ORDER — XTAMPZA ER 13.5 MG PO C12A: 1.0000 | EXTENDED_RELEASE_CAPSULE | Freq: Two times a day (BID) | ORAL | 0 refills | Status: DC

## 2022-07-02 NOTE — Telephone Encounter (Signed)
Spoke with patients husband.  He understands that the Avastin is going to be restarted this week.  He states that the dizziness has gotten so bad that patient can't turn her head at all without getting dizzy and reports that while watching tv she also gets extremely dizzy and just has to close her eyes and listen instead of watch.  He wanted to know if there was anything stronger than Meclizine and if there was something did it not cause sedation.  He reports she is sleeping 16 hours a day.   Routed to Dr Mickeal Skinner

## 2022-07-02 NOTE — Telephone Encounter (Signed)
Spoke with spouse and he states that she is inconsistently taking the meclizine so they will try to take according to instructions for a few days and see if that helps before moving to requesting Rx for Valium for the dizziness.  He will report back with update.

## 2022-07-05 ENCOUNTER — Ambulatory Visit (HOSPITAL_COMMUNITY)
Admission: RE | Admit: 2022-07-05 | Discharge: 2022-07-05 | Disposition: A | Discharge status: Home / Self Care | Payer: BC Managed Care – PPO | Source: Ambulatory Visit | Attending: Hematology and Oncology | Admitting: Hematology and Oncology

## 2022-07-05 ENCOUNTER — Inpatient Hospital Stay: Payer: BC Managed Care – PPO | Admitting: Hematology and Oncology

## 2022-07-05 ENCOUNTER — Inpatient Hospital Stay: Payer: BC Managed Care – PPO

## 2022-07-05 ENCOUNTER — Other Ambulatory Visit: Payer: Self-pay | Admitting: Internal Medicine

## 2022-07-05 ENCOUNTER — Other Ambulatory Visit: Payer: Self-pay | Admitting: *Deleted

## 2022-07-05 VITALS — BP 122/90 | HR 97 | Temp 98.5°F | Resp 15 | Wt 139.8 lb

## 2022-07-05 DIAGNOSIS — R918 Other nonspecific abnormal finding of lung field: Secondary | ICD-10-CM | POA: Diagnosis not present

## 2022-07-05 DIAGNOSIS — C7931 Secondary malignant neoplasm of brain: Secondary | ICD-10-CM | POA: Diagnosis not present

## 2022-07-05 DIAGNOSIS — Z9049 Acquired absence of other specified parts of digestive tract: Secondary | ICD-10-CM | POA: Diagnosis not present

## 2022-07-05 DIAGNOSIS — Z5111 Encounter for antineoplastic chemotherapy: Secondary | ICD-10-CM | POA: Diagnosis not present

## 2022-07-05 DIAGNOSIS — Y842 Radiological procedure and radiotherapy as the cause of abnormal reaction of the patient, or of later complication, without mention of misadventure at the time of the procedure: Secondary | ICD-10-CM

## 2022-07-05 DIAGNOSIS — I10 Essential (primary) hypertension: Secondary | ICD-10-CM | POA: Diagnosis not present

## 2022-07-05 DIAGNOSIS — C3490 Malignant neoplasm of unspecified part of unspecified bronchus or lung: Secondary | ICD-10-CM | POA: Diagnosis not present

## 2022-07-05 DIAGNOSIS — R42 Dizziness and giddiness: Secondary | ICD-10-CM | POA: Diagnosis not present

## 2022-07-05 DIAGNOSIS — Z7901 Long term (current) use of anticoagulants: Secondary | ICD-10-CM | POA: Diagnosis not present

## 2022-07-05 DIAGNOSIS — C3431 Malignant neoplasm of lower lobe, right bronchus or lung: Secondary | ICD-10-CM | POA: Diagnosis not present

## 2022-07-05 DIAGNOSIS — M549 Dorsalgia, unspecified: Secondary | ICD-10-CM | POA: Diagnosis not present

## 2022-07-05 DIAGNOSIS — E876 Hypokalemia: Secondary | ICD-10-CM | POA: Diagnosis not present

## 2022-07-05 DIAGNOSIS — R0600 Dyspnea, unspecified: Secondary | ICD-10-CM | POA: Diagnosis not present

## 2022-07-05 DIAGNOSIS — C7951 Secondary malignant neoplasm of bone: Secondary | ICD-10-CM | POA: Diagnosis not present

## 2022-07-05 DIAGNOSIS — Z79899 Other long term (current) drug therapy: Secondary | ICD-10-CM | POA: Diagnosis not present

## 2022-07-05 DIAGNOSIS — Z5112 Encounter for antineoplastic immunotherapy: Secondary | ICD-10-CM | POA: Diagnosis not present

## 2022-07-05 DIAGNOSIS — R110 Nausea: Secondary | ICD-10-CM | POA: Diagnosis not present

## 2022-07-05 DIAGNOSIS — Z86718 Personal history of other venous thrombosis and embolism: Secondary | ICD-10-CM | POA: Diagnosis not present

## 2022-07-05 DIAGNOSIS — Z23 Encounter for immunization: Secondary | ICD-10-CM | POA: Diagnosis not present

## 2022-07-05 DIAGNOSIS — R112 Nausea with vomiting, unspecified: Secondary | ICD-10-CM | POA: Diagnosis not present

## 2022-07-05 LAB — CMP (CANCER CENTER ONLY)
ALT: 17 U/L (ref 0–44)
AST: 23 U/L (ref 15–41)
Albumin: 3.5 g/dL (ref 3.5–5.0)
Alkaline Phosphatase: 68 U/L (ref 38–126)
Anion gap: 8 (ref 5–15)
BUN: 10 mg/dL (ref 8–23)
CO2: 31 mmol/L (ref 22–32)
Calcium: 9.4 mg/dL (ref 8.9–10.3)
Chloride: 101 mmol/L (ref 98–111)
Creatinine: 0.96 mg/dL (ref 0.44–1.00)
GFR, Estimated: >60 mL/min (ref >60)
Glucose, Bld: 117 mg/dL — ABNORMAL HIGH (ref 70–99)
Potassium: 3.3 mmol/L — ABNORMAL LOW (ref 3.5–5.1)
Sodium: 140 mmol/L (ref 135–145)
Total Bilirubin: 0.4 mg/dL (ref 0.3–1.2)
Total Protein: 7.3 g/dL (ref 6.5–8.1)

## 2022-07-05 LAB — CBC WITH DIFFERENTIAL (CANCER CENTER ONLY)
Abs Immature Granulocytes: 0.02 10*3/uL (ref 0.00–0.07)
Basophils Absolute: 0 10*3/uL (ref 0.0–0.1)
Basophils Relative: 1 %
Eosinophils Absolute: 0.1 10*3/uL (ref 0.0–0.5)
Eosinophils Relative: 2 %
HCT: 37.4 % (ref 36.0–46.0)
Hemoglobin: 13.4 g/dL (ref 12.0–15.0)
Immature Granulocytes: 0 %
Lymphocytes Relative: 30 %
Lymphs Abs: 1.7 10*3/uL (ref 0.7–4.0)
MCH: 34.2 pg — ABNORMAL HIGH (ref 26.0–34.0)
MCHC: 35.8 g/dL (ref 30.0–36.0)
MCV: 95.4 fL (ref 80.0–100.0)
Monocytes Absolute: 1 10*3/uL (ref 0.1–1.0)
Monocytes Relative: 17 %
Neutro Abs: 3 10*3/uL (ref 1.7–7.7)
Neutrophils Relative %: 50 %
Platelet Count: 392 10*3/uL (ref 150–400)
RBC: 3.92 MIL/uL (ref 3.87–5.11)
RDW: 14.1 % (ref 11.5–15.5)
WBC Count: 5.8 10*3/uL (ref 4.0–10.5)
nRBC: 0 % (ref 0.0–0.2)

## 2022-07-05 LAB — TOTAL PROTEIN, URINE DIPSTICK: Protein, ur: NEGATIVE mg/dL

## 2022-07-05 LAB — TSH: TSH: 2.924 u[IU]/mL (ref 0.350–4.500)

## 2022-07-05 MED ORDER — SODIUM CHLORIDE 0.9 % IV SOLN: Freq: Once | INTRAVENOUS | Status: AC

## 2022-07-05 MED ORDER — SODIUM CHLORIDE 0.9 % IV SOLN
200.0000 mg | Freq: Once | INTRAVENOUS | Status: AC
  Administered 2022-07-05: 200 mg via INTRAVENOUS
  Filled 2022-07-05: qty 200

## 2022-07-05 MED ORDER — PROCHLORPERAZINE MALEATE 10 MG PO TABS
10.0000 mg | ORAL_TABLET | Freq: Once | ORAL | Status: AC
  Administered 2022-07-05: 10 mg via ORAL
  Filled 2022-07-05: qty 1

## 2022-07-05 MED ORDER — OXYCODONE HCL 5 MG PO TABS
5.0000 mg | ORAL_TABLET | Freq: Once | ORAL | Status: AC
  Administered 2022-07-05: 5 mg via ORAL
  Filled 2022-07-05: qty 1

## 2022-07-05 MED ORDER — SODIUM CHLORIDE 0.9 % IV SOLN
10.0000 mg/kg | Freq: Once | INTRAVENOUS | Status: AC
  Administered 2022-07-05: 700 mg via INTRAVENOUS
  Filled 2022-07-05: qty 16

## 2022-07-05 MED ORDER — IOHEXOL 300 MG/ML  SOLN
100.0000 mL | Freq: Once | INTRAMUSCULAR | Status: AC | PRN
  Administered 2022-07-05: 100 mL via INTRAVENOUS

## 2022-07-05 MED ORDER — SODIUM CHLORIDE 0.9 % IV SOLN
500.0000 mg/m2 | Freq: Once | INTRAVENOUS | Status: AC
  Administered 2022-07-05: 900 mg via INTRAVENOUS
  Filled 2022-07-05: qty 20

## 2022-07-05 MED ORDER — SODIUM CHLORIDE (PF) 0.9 % IJ SOLN
INTRAMUSCULAR | Status: AC
  Filled 2022-07-05: qty 50

## 2022-07-05 NOTE — Progress Notes (Signed)
Per Dr Lorenso Courier, hold Zometa today. Per Dr Mickeal Skinner, ok to restart bevacizumab today.

## 2022-07-05 NOTE — Patient Instructions (Signed)
[facility]  Discharge Instructions: Thank you for choosing [facility] to provide your oncology and hematology care.   If you have a lab appointment with the [facility], please go directly to the [facility] and check in at the registration area.   Wear comfortable clothing and clothing appropriate for easy access to any Portacath or PICC line.   We strive to give you quality time with your provider. You may need to reschedule your appointment if you arrive late (15 or more minutes).  Arriving late affects you and other patients whose appointments are after yours.  Also, if you miss three or more appointments without notifying the office, you may be dismissed from the clinic at the provider's discretion.      For prescription refill requests, have your pharmacy contact our office and allow 72 hours for refills to be completed.    Today you received the following chemotherapy and/or immunotherapy agents: bevacizumab, pembrolizumab, pemetrexed      To help prevent nausea and vomiting after your treatment, we encourage you to take your nausea medication as directed.  BELOW ARE SYMPTOMS THAT SHOULD BE REPORTED IMMEDIATELY: *FEVER GREATER THAN 100.4 F (38 C) OR HIGHER *CHILLS OR SWEATING *NAUSEA AND VOMITING THAT IS NOT CONTROLLED WITH YOUR NAUSEA MEDICATION *UNUSUAL SHORTNESS OF BREATH *UNUSUAL BRUISING OR BLEEDING *URINARY PROBLEMS (pain or burning when urinating, or frequent urination) *BOWEL PROBLEMS (unusual diarrhea, constipation, pain near the anus) TENDERNESS IN MOUTH AND THROAT WITH OR WITHOUT PRESENCE OF ULCERS (sore throat, sores in mouth, or a toothache) UNUSUAL RASH, SWELLING OR PAIN  UNUSUAL VAGINAL DISCHARGE OR ITCHING   Items with * indicate a potential emergency and should be followed up as soon as possible or go to the Emergency Department if any problems should occur.  Please show the CHEMOTHERAPY ALERT CARD or  IMMUNOTHERAPY ALERT CARD at check-in to the Emergency Department and triage nurse.  Should you have questions after your visit or need to cancel or reschedule your appointment, please contact [facility]  Dept: [phone]  and follow the prompts.  Office hours are 8:00 a.m. to 4:30 p.m. Monday - Friday. Please note that voicemails left after 4:00 p.m. may not be returned until the following business day.  We are closed weekends and major holidays. You have access to a nurse at all times for urgent questions. Please call the main number to the clinic Dept: [phone] and follow the prompts.   For any non-urgent questions, you may also contact your provider using MyChart. We now offer e-Visits for anyone 53 and older to request care online for non-urgent symptoms. For details visit [email or web address].   Also download the MyChart app! Go to the app store, search "MyChart", open the app, select [facility], and log in with your MyChart username and password.  Masks are optional in the cancer centers. If you would like for your care team to wear a mask while they are taking care of you, please let them know. You may have one support person who is at least 65 years old accompany you for your appointments.

## 2022-07-05 NOTE — Progress Notes (Signed)
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 CT of the head showed showed concerning for metastatic disease involving the left cerebellum and right occipital lobe.  2) 09/30/2019: MRI brain confirms mass within the left cerebellum measures 1.6 x 1.3 x 1.5 cm as well as at least 8 metastatic lesions within the supratentorial and infratentorial brain as outlined 3) 10/01/2019: PET CT scan revealed hypermetabolic infrahilar right lower lobe mass with hypermetabolic nodal metastases in the right hilum, mediastinum, right supraclavicular region, left axilla and lower left neck. 4) 10/08/2019: US guided biopsy of the lymph nodes confirms poorly differentiated non-small cell carcinoma. Immunohistochemistry confirms adenocarcinoma of lung primary. PD-L1 and NGS later revealed a MET Exon 14 mutation and TPS of 90%.  5) 10/12/2019: Establish care with Dr. Lorenso Courier  6) 11/04/2019: Day 1 of capmatinib 427m BID 7) 11/09/2019: presented to Rad/Onc with a DVT in LLE. Seen in symptom management clinic and started on apixaban.  8) 12/29/2019: CT C/A/P showed interval decrease in size of mass involving the superior segment of right lower lobe and reduction in mediastinal and left supraclavicular adenopathy though some small spinal lesions were noted in the interim between the two sets of scans 9) 01/25/2020: temporarily stopped capmatinib due to symptoms of nausea/fatigue. Restarted therapy on 01/30/2020 after brief chemo holiday.  10)  03/22/2020: CT C/A/P reveals a mixed picture, with new lung nodule and increased lymphadenopathy, however response in bone lesions. D/c capmatinib therapy 11) 04/08/2020: start of Carbo/Pem/Pem for 2nd line treatment. Cycle 1 Day 1   12) 04/29/2020: Cycle 2 Day 1 Carbo/Pem/Pem  13) 05/20/2020: Cycle 3 Day 1 Carbo/Pem/Pem  14) 06/09/2020: Cycle 4 Day 1 Carbo/Pem/Pem  15) 06/29/2020: Cycle 5 Day 1 maintenance Pem/Pem  16) 07/20/2020: Cycle 6 Day 1 maintenance Pem/Pem  17) 08/12/2020: Cycle 7 Day 1 maintenance Pem/Pem  18) 09/01/2020: Cycle 8 Day 1 maintenance Pem/Pem  19) 09/21/2020: Cycle 9 Day 1 maintenance Pem/Pem  20) 10/13/2020: Cycle 10 Day 1 maintenance Pem/Pem  21) 11/03/2020: Cycle 11 Day 1 maintenance Pem/Pem  22) 11/25/2020:  Cycle 12 Day 1 maintenance Pem/Pem 23) 12/15/2020:  Cycle 13 Day 1 maintenance Pem/Pem 24) 01/05/2021:  Cycle 14 Day 1 maintenance Pem/Pem 25) 01/26/2021:  Cycle 15 Day 1 maintenance Pem/Pem plus Bevacizumab.  02/17/2021: Cycle 16 Day 1 maintenance Pem/Pem plus Bevacizumab.  03/10/2021: Cycle 17 Day 1 maintenance Pem/Pem plus Bevacizumab.  03/31/2021: Cycle 18 Day 1 maintenance Pem/Pem  04/20/2021:  Cycle 19 Day 1 maintenance Pem/Pem  05/12/2021: Cycle 20 Day 1 maintenance Pem/Pem 06/01/2021: Cycle 21 Day 1 maintenance Pem/Pem 06/22/2021: Cycle 22 Day 1 maintenance Pem/Pem 07/13/2021: Cycle 23 Day 1 maintenance Pem/Pem 08/16/2021: Cycle 24 Day 1 maintenance Pem/Pem 09/08/2021: Cycle 25 Day 1 maintenance Pem/Pem plus Bevacizumab 09/28/2021: Cycle 26 Day 1 maintenance Pem/Pem plus Bevacizumab 10/20/2021: Cycle 27 Day 1 maintenance Pem/Pem plus Bevacizumab 11/09/2021: Cycle 28 Day 1 maintenance Pem/Pem  12/06/2021: Cycle 29 Day 1 maintenance Pem/Pem  12/27/2021: Cycle 30 Day 1 maintenance Pem/Pem  01/18/2022: Cycle 31 Day 1 maintenance Pem/Pem  02/07/2022: Cycle 32 Day  1 maintenance Pem/Pem  02/28/2022: Cycle 33 Day 1 maintenance Pem/Pem  03/23/2022: Cycle 34 Day 1 maintenance Pem/Pem   04/13/2022: Cycle 35 Day 1 maintenance Pem/Pem  05/04/2022: Cycle 36 Day 1 maintenance Pem/Pem  05/25/2022: Cycle 37 Day 1 maintenance Pem/Pem  06/14/2022: Cycle 38 Day 1 maintenance Pem/Pem 07/05/2022: Cycle 39 Day 1 maintenance Pem/Pem  Interval History:  Virginia [name] 66 y.o. female with medical history significant for metastatic adenocarcinoma of the lung presents for a follow up visit. The patient's last visit was on 06/14/2022.  In the interim since the last visit, she continues on maintenance Pem/Pem.   On exam today Virginia [name] is accompanied by her husband.  She continues to struggle with dizziness which she reports is the same but her husband notes may be slightly worse.  She reports she forgot to take her oxycodone this morning and is having back pain 6 out of 10.  She reports normally her pain is well controlled on her current pain regimen.  She has her bowels are moving okay.  Her appetite has been poor and she is been eating "like a bird".  She unfortunately has lost another 3 pounds in the interim since our last visit.  She has lost 8 pounds overall since mid September.  She reports that she is doing her best to try to eat more potassium rich foods to her potassium levels were low.  She even tried babyfood bananas but did not enjoy it.  She notes that she is otherwise tolerating treatment quite well and is willing and able to proceed at this time.  She denies any fevers, chills, sweats, vomiting, diarrhea.  Full 10 point ROS was otherwise negative  Of note the patient plans on visiting [place] from November 14 until August 09, 2022.  MEDICAL HISTORY:  Past Medical History:  Diagnosis Date   Anemia    Anxiety    Concussion 09/28/2019   DVT (deep venous thrombosis) ([facility]) 2021   L leg   Dyspnea    GERD (gastroesophageal reflux disease)    Hypercholesterolemia    per pt, she does not have elevated lipids   Hypertension    met lung ca dx'd 09/2019   mets to spine, hip and brain    PONV (postoperative nausea and vomiting)    Tobacco abuse     SURGICAL HISTORY: Past Surgical History:  Procedure Laterality Date   ABDOMINAL HYSTERECTOMY     partial/ left ovaries   APPLICATION OF CRANIAL NAVIGATION N/A 12/02/2020   Procedure: APPLICATION OF CRANIAL NAVIGATION;  Surgeon: Erline Levine, MD;  Location: [facility];  Service: Neurosurgery;  Laterality: N/A;   CHOLECYSTECTOMY     CRANIOTOMY N/A 12/02/2020   Procedure: Posterior fossa craniotomy for tumor resection with brainlab;  Surgeon: Erline Levine, MD;  Location: [facility];  Service: Neurosurgery;  Laterality: N/A;   DILATION AND CURETTAGE OF UTERUS     IR IMAGING GUIDED PORT INSERTION  10/23/2019   IR PATIENT EVAL TECH 0-60 MINS  11/21/2021   IR PATIENT EVAL TECH 0-60 MINS  11/24/2021   IR PATIENT EVAL TECH 0-60 MINS  11/29/2021   IR PATIENT EVAL TECH 0-60 MINS  12/04/2021   IR PATIENT EVAL TECH 0-60 MINS  12/12/2021   IR PATIENT EVAL TECH 0-60 MINS  12/19/2021   IR PATIENT EVAL TECH 0-60 MINS  12/27/2021   IR PATIENT EVAL TECH 0-60 MINS  01/03/2022   IR PATIENT EVAL TECH 0-60 MINS  01/10/2022   IR PATIENT  EVAL TECH 0-60 MINS  01/18/2022   IR PATIENT EVAL TECH 0-60 MINS  02/13/2022   IR PATIENT EVAL TECH 0-60 MINS  02/20/2022   IR RADIOLOGIST EVAL & MGMT  01/23/2022   IR REMOVAL TUN ACCESS W/ PORT W/O FL MOD SED  11/17/2021   KYPHOPLASTY N/A 03/15/2020   Procedure: Thoracic Eight KYPHOPLASTY;  Surgeon: Erline Levine, MD;  Location: [facility];  Service: Neurosurgery;  Laterality: N/A;  prone    LIPOMA EXCISION  2018   removed under left breast and right thigh.   TUBAL LIGATION      ALLERGIES:  has No Known Allergies.  MEDICATIONS:  Current Outpatient Medications  Medication Sig Dispense Refill   acetaminophen (TYLENOL) 500 MG tablet Take 500 mg by mouth 2 (two) times daily as needed for moderate pain.     albuterol (VENTOLIN HFA) 108 (90 Base) MCG/ACT inhaler INHALE 2 PUFFS INTO THE LUNGS EVERY 6 HOURS AS NEEDED FOR WHEEZING OR  SHORTNESS OF BREATH 6.7 g 11   ALPRAZolam (XANAX) 0.25 MG tablet Take 1 tablet (0.25 mg total) by mouth 2 (two) times daily as needed for anxiety 60 tablet 1   amLODipine (NORVASC) 10 MG tablet Take 1 tablet (10 mg total) by mouth daily. 90 tablet 3   Cyanocobalamin (VITAMIN B12 PO) Take 1 tablet by mouth daily. Gummies     EPINEPHrine 0.3 mg/0.3 mL IJ SOAJ injection Inject 0.3 mg into the muscle as needed for anaphylaxis. 2 each 0   folic acid (FOLVITE) 1 MG tablet Take 1 tablet by mouth daily. 90 tablet 1   furosemide (LASIX) 20 MG tablet Take 2 tablets (40 mg total) by mouth 2 times daily. 360 tablet 0   Glucosamine HCl (GLUCOSAMINE PO) Take 2 tablets by mouth daily.     lidocaine-prilocaine (EMLA) cream Apply to port as needed. 30 g 0   LYSINE PO Take 1 tablet by mouth daily.     meclizine (ANTIVERT) 25 MG tablet Take 1 tablet (25 mg total) by mouth 3 (three) times daily as needed for dizziness. 30 tablet 0   Multiple Vitamin (MULTIVITAMIN WITH MINERALS) TABS tablet Take 1 tablet by mouth daily.     Multiple Vitamins-Minerals (AIRBORNE PO) Take 1 tablet by mouth daily.      OLANZapine (ZYPREXA) 2.5 MG tablet TAKE 1 TABLET BY MOUTH AT BEDTIME 90 tablet 1   ondansetron (ZOFRAN) 4 MG tablet Take 1 tablet (4 mg total) by mouth every 8 (eight) hours as needed for nausea or vomiting. 40 tablet 2   OVER THE COUNTER MEDICATION Take 2 tablets by mouth daily. Beet root     oxyCODONE (OXY IR/ROXICODONE) 5 MG immediate release tablet TAKE 1 TO 2 TABLETS BY MOUTH EVERY 4 HOURS AS NEEDED FOR SEVERE PAIN 90 tablet 0   pantoprazole (PROTONIX) 40 MG tablet TAKE 1 TABLET BY MOUTH ONCE DAILY 90 tablet 3   polyethylene glycol (MIRALAX / GLYCOLAX) 17 g packet Take 17 g by mouth every evening.     potassium chloride SA (KLOR-CON M) 20 MEQ tablet Take 2 tablets (40 mEq total) by mouth 2 (two) times daily. 360 tablet 0   Tetrahydrozoline HCl (VISINE OP) Place 1 drop into both eyes daily as needed (irritation).      VITAMIN D PO Take 1 capsule by mouth daily.     vitamin E 180 MG (400 UNITS) capsule Take 400 IU daily x 1 week, then 400 IU BID (Patient taking differently: Take 400 Units by mouth  daily.) 60 capsule 4   XTAMPZA ER 13.5 MG C12A Take 1 capsule by mouth in the morning and at bedtime. (every 12 hours) 60 capsule 0   No current facility-administered medications for this visit.   Facility-Administered Medications Ordered in Other Visits  Medication Dose Route Frequency Provider Last Rate Last Admin   bevacizumab-awwb (MVASI) 700 mg in sodium chloride 0.9 % 100 mL chemo infusion  10 mg/kg (Treatment Plan Recorded) Intravenous Once Vaslow, Acey Lav, MD       pembrolizumab [facility]) 200 mg in sodium chloride 0.9 % 50 mL chemo infusion  200 mg Intravenous Once Orson Slick, MD       PEMEtrexed (ALIMTA) 900 mg in sodium chloride 0.9 % 100 mL chemo infusion  500 mg/m2 (Treatment Plan Recorded) Intravenous Once Orson Slick, MD        REVIEW OF SYSTEMS:   Constitutional: ( - ) fevers, ( - )  chills , ( - ) night sweats Eyes: ( - ) blurriness of vision, ( - ) double vision, ( - ) watery eyes Ears, nose, mouth, throat, and face: ( - ) mucositis, ( - ) sore throat Respiratory: ( - ) cough, ( + ) dyspnea, ( - ) wheezes Cardiovascular: ( - ) palpitation, ( - ) chest discomfort, ( - ) lower extremity swelling Gastrointestinal:  ( + ) nausea, ( - ) heartburn, ( - ) change in bowel habits Skin: ( - ) abnormal skin rashes Lymphatics: ( - ) new lymphadenopathy, ( - ) easy bruising Neurological: ( - ) numbness, ( - ) tingling, ( - ) new weaknesses Behavioral/Psych: ( - ) mood change, ( - ) new changes  All other systems were reviewed with the patient and are negative.  PHYSICAL EXAMINATION: ECOG PERFORMANCE STATUS: 1 - Symptomatic but completely ambulatory  Vitals:   07/05/22 1138  BP: (!) 122/90  Pulse: 97  Resp: 15  Temp: 98.5 F (36.9 C)  SpO2: 95%      Filed Weights   07/05/22  1138  Weight: 139 lb 12.8 oz (63.4 kg)    GENERAL: well appearing middle aged Caucasian female in NAD  SKIN: skin color, texture, turgor are normal, no rashes or significant lesions EYES: conjunctiva are pink and non-injected, sclera clear LUNGS: wheezing at lung bases. clear to auscultation and percussion with normal breathing effort HEART: regular rate & rhythm and no murmurs and bilateral trace pitting lower extremity edema Musculoskeletal: no cyanosis of digits and no clubbing PSYCH: alert & oriented x 3, fluent speech NEURO: no focal motor/sensory deficits  LABORATORY DATA:  I have reviewed the data as listed    Latest Ref Rng & Units 07/05/2022   10:46 AM 06/14/2022   11:06 AM 05/25/2022   10:44 AM  CBC  WBC 4.0 - 10.5 K/uL 5.8  5.9  5.0   Hemoglobin 12.0 - 15.0 g/dL 13.4  12.9  12.3   Hematocrit 36.0 - 46.0 % 37.4  36.1  36.0   Platelets 150 - 400 K/uL 392  464  357        Latest Ref Rng & Units 07/05/2022   10:46 AM 06/14/2022   11:06 AM 05/25/2022   10:44 AM  CMP  Glucose 70 - 99 mg/dL 117  129  125   BUN 8 - 23 mg/dL $Remove'10  8  13   'PULbvrb$ Creatinine 0.44 - 1.00 mg/dL 0.96  0.78  0.74   Sodium 135 - 145 mmol/L 140  139  137   Potassium 3.5 - 5.1 mmol/L 3.3  3.4  3.3   Chloride 98 - 111 mmol/L 101  101  101   CO2 22 - 32 mmol/L 31  30  29    Calcium 8.9 - 10.3 mg/dL 9.4  9.7  9.3   Total Protein 6.5 - 8.1 g/dL 7.3  7.7  7.1   Total Bilirubin 0.3 - 1.2 mg/dL 0.4  0.4  0.5   Alkaline Phos 38 - 126 U/L 68  65  58   AST 15 - 41 U/L 23  21  23    ALT 0 - 44 U/L 17  15  15       RADIOGRAPHIC STUDIES: I personally have viewed the radiographic studies below: No results found.   ASSESSMENT & PLAN AMONDA BRILLHART 66 y.o. female with medical history significant for metastatic adenocarcinoma of the lung presents for a follow up visit. 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   Previously we discussed Carboplatin/Pembrolizumab/Pemetrexed and  the expected side effect from these medications.  We discussed the possible side effects of immunotherapy including colitis, hepatitis, dermatitis, and pneumonitis.  Additionally we discussed the side effects of carboplatin which include suppression of blood counts, nephrotoxicity, and nausea.  Additionally we discussed pemetrexed which can also have effects on counts and would require supplementation with vitamin M25 and folic acid.  She voiced her understanding of these side effects and treatment plans moving forward.  I also noted that we would be able to drop the chemotherapy agents that she had intolerance to continue with pembrolizumab alone given her high TPS score.  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 Due to worsening dizziness, she resumed this from Cycle 25-27.  --repeat CT CAP on 12/11/2021 showed stable disease with no evidence of progression. Next due in July 2023.  PLAN: --Labs from today were reviewed and adequate for treatment.  Labs today show creatinine 0.96, white blood cell 5.8, hemoglobin 13.4, MCV 95.4, and platelets of 392 --Continue with Cycle 39, Day 1 of maintenance Pem/Pem. --RTC q 3 weeks for labs, follow up visit with Dr. Lorenso Courier prior to Cycle 40.    #Nausea/Vomiting, improving -- provided patient with Zofran 4-8mg  q8h PRN and compazine 10mg  q6H for breakthrough PRN.  She is currently using Phenergan for breakthrough. --continue olanzpaine 2.5 mg PO QHS to help with nausea. Marked improvement since the addition of this medication on 01/29/2020.  --continue to monitor   #Hypokalemia, stable --likely 2/2 to decreased PO intake, hypovolemia, and BP medications --Potassium level 3.3 today.  Continue oral potassium chloride with treatment --Currently on  potassium chloride tablets, 40 mEq PO potassium in AM and 37mEq in PM. Due to difficulty swallowing tablets, we will try granules. Sent new prescription today.   #Pain Control, stable --continue oxycodone 5mg  q4H PRN. Can increase dose to 10mg  q6H if pain is severe. She is taking approximately 2-3 pills per day.  --continue Xtampza 13.5 mg ER BID for long acting support.  --continue to take with Senokot to prevent opioid induced constipation. 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 Last 01/18/2022, next dose currently due.  --nausea as above    #  Brain Metastasis, stable --appreciate the assistance of Dr. 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 --radiation therapy to the brain completed on 11/16/2019.  --MRI brain on 04/29/2020 showed evidence of small brain bleed. Eliquis therapy was discontinued.  --MRI on 11/04/2020 showed concern for progression of intracranial disease.  --On 12/02/2020, patient underwent craniotomy, resection of progressive cerebellar mass by Dr. Vertell Limber. 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  --MRI on 12/23/2020 showed evidence of radiation necrosis. Dr. Mickeal Skinner evaluated the patient on 01/02/2021 with recommendations to add IV Bevacizumab 10 mg/kg q 3 weeks ( started 01/26/2021). Completed a full course of this therapy. --Patient was evaluated by Dr. Mickeal Skinner on 08/29/2021 and recommended to resume Bevacizumab due to worsening dizziness.  Which was given from Cycle 25-27.  Plan: --continue to follow with Dr. Mickeal Skinner  --Avastin added back in the patient's chemotherapy regimen. Will restart in 4 weeks from today (out from dental procedure).  --next brain MRI 08/10/2022.   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    #  Hypertension: --Continue Norvasc to 10 mg daily.  --continue to monitor at home and in clinic.    No orders of the defined types were placed in this encounter.   All questions were answered. The patient knows to call the clinic with any problems, questions or concerns.  I have spent a total of 30 minutes minutes of face-to-face and non-face-to-face time, preparing to see the patient, performing a medically appropriate examination, counseling and educating the patient,  documenting clinical information in the electronic health record, and care coordination.   Ledell Peoples, MD Department of Hematology/Oncology [facility] Phone: [phone] Pager: [phone] Email: [email or web address]   07/05/2022 1:48 PM   Literature Support:  Lorenza Chick, Rodrguez-Abreu D, Duffy Rhody, Felip E, De Angelis F, Domine M, [place], Hochmair MJ, [place], [place], Bischoff HG, [place], Grossi F, [place], Reck M, [place], [place], [place], Rubio-Viqueira B, Novello S, Kurata T, Gray JE, Vida J, Wei Z, Yang J, Raftopoulos H, [place], [place] [employer]; [id number] Investigators. Pembrolizumab plus Chemotherapy in Metastatic Non-Small-Cell Lung Cancer. [place] Med. 2018 May 31;378(22):2078-2092.  --Median progression-free survival was 8.8 months (95% CI, 7.6 to 9.2) in the pembrolizumab-combination group and 4.9 months (95% CI, 4.7 to 5.5) in the placebo-combination group (hazard ratio for disease progression or death, 0.52; 95% CI, 0.43 to 0.64; P<0.001). Adverse events of grade 3 or higher occurred in 67.2% of the patients in the pembrolizumab-combination group and in 65.8% of those in the placebo-combination group.  Jodene Nam L. Efficacy of pemetrexed-based regimens in advanced non-small cell lung cancer patients with activating epidermal growth factor receptor mutations after tyrosine kinase inhibitor failure: a systematic review.  Onco Targets Ther. 2018;11:2121-2129.   --The weighted median PFS, median OS, and ORR for patients treated with pem regimens were 5.09 months, 15.91 months, and 30.19%, respectively. Our systematic review results showed a favorable efficacy profile of pem regimens in NSCLC patients with EGFR mutation after EGFR-TKI failure.

## 2022-07-06 ENCOUNTER — Telehealth: Payer: Self-pay | Admitting: Hematology and Oncology

## 2022-07-06 NOTE — Telephone Encounter (Signed)
Per workque called and left message for pt about appointments

## 2022-07-16 ENCOUNTER — Encounter: Payer: Self-pay | Admitting: Internal Medicine

## 2022-07-16 ENCOUNTER — Encounter: Payer: Self-pay | Admitting: Hematology and Oncology

## 2022-07-18 ENCOUNTER — Other Ambulatory Visit: Payer: Self-pay | Admitting: Hematology and Oncology

## 2022-07-19 ENCOUNTER — Inpatient Hospital Stay: Payer: Medicare Other | Attending: Hematology and Oncology

## 2022-07-19 ENCOUNTER — Other Ambulatory Visit: Payer: Self-pay | Admitting: Hematology and Oncology

## 2022-07-19 ENCOUNTER — Inpatient Hospital Stay (HOSPITAL_BASED_OUTPATIENT_CLINIC_OR_DEPARTMENT_OTHER): Payer: Medicare Other | Admitting: Internal Medicine

## 2022-07-19 ENCOUNTER — Inpatient Hospital Stay: Payer: Medicare Other

## 2022-07-19 VITALS — BP 128/94 | HR 102 | Temp 98.2°F | Resp 15 | Wt 142.3 lb

## 2022-07-19 VITALS — BP 128/96 | HR 91 | Temp 98.1°F | Resp 16

## 2022-07-19 DIAGNOSIS — Y842 Radiological procedure and radiotherapy as the cause of abnormal reaction of the patient, or of later complication, without mention of misadventure at the time of the procedure: Secondary | ICD-10-CM

## 2022-07-19 DIAGNOSIS — Z5112 Encounter for antineoplastic immunotherapy: Secondary | ICD-10-CM | POA: Insufficient documentation

## 2022-07-19 DIAGNOSIS — Z5111 Encounter for antineoplastic chemotherapy: Secondary | ICD-10-CM | POA: Diagnosis not present

## 2022-07-19 DIAGNOSIS — C7931 Secondary malignant neoplasm of brain: Secondary | ICD-10-CM | POA: Insufficient documentation

## 2022-07-19 DIAGNOSIS — C3431 Malignant neoplasm of lower lobe, right bronchus or lung: Secondary | ICD-10-CM | POA: Insufficient documentation

## 2022-07-19 DIAGNOSIS — R42 Dizziness and giddiness: Secondary | ICD-10-CM | POA: Insufficient documentation

## 2022-07-19 DIAGNOSIS — Z809 Family history of malignant neoplasm, unspecified: Secondary | ICD-10-CM | POA: Insufficient documentation

## 2022-07-19 DIAGNOSIS — Z86718 Personal history of other venous thrombosis and embolism: Secondary | ICD-10-CM | POA: Insufficient documentation

## 2022-07-19 DIAGNOSIS — C7951 Secondary malignant neoplasm of bone: Secondary | ICD-10-CM | POA: Insufficient documentation

## 2022-07-19 DIAGNOSIS — Z79899 Other long term (current) drug therapy: Secondary | ICD-10-CM | POA: Insufficient documentation

## 2022-07-19 DIAGNOSIS — I6789 Other cerebrovascular disease: Secondary | ICD-10-CM | POA: Diagnosis not present

## 2022-07-19 DIAGNOSIS — Z9049 Acquired absence of other specified parts of digestive tract: Secondary | ICD-10-CM | POA: Insufficient documentation

## 2022-07-19 DIAGNOSIS — Z814 Family history of other substance abuse and dependence: Secondary | ICD-10-CM | POA: Insufficient documentation

## 2022-07-19 DIAGNOSIS — Z87891 Personal history of nicotine dependence: Secondary | ICD-10-CM | POA: Insufficient documentation

## 2022-07-19 DIAGNOSIS — Z8249 Family history of ischemic heart disease and other diseases of the circulatory system: Secondary | ICD-10-CM | POA: Diagnosis not present

## 2022-07-19 DIAGNOSIS — E032 Hypothyroidism due to medicaments and other exogenous substances: Secondary | ICD-10-CM

## 2022-07-19 DIAGNOSIS — R55 Syncope and collapse: Secondary | ICD-10-CM | POA: Insufficient documentation

## 2022-07-19 LAB — CBC WITH DIFFERENTIAL (CANCER CENTER ONLY)
Abs Immature Granulocytes: 0.04 10*3/uL (ref 0.00–0.07)
Basophils Absolute: 0 10*3/uL (ref 0.0–0.1)
Basophils Relative: 0 %
Eosinophils Absolute: 0.1 10*3/uL (ref 0.0–0.5)
Eosinophils Relative: 2 %
HCT: 33.5 % — ABNORMAL LOW (ref 36.0–46.0)
Hemoglobin: 11.9 g/dL — ABNORMAL LOW (ref 12.0–15.0)
Immature Granulocytes: 1 %
Lymphocytes Relative: 44 %
Lymphs Abs: 2.3 10*3/uL (ref 0.7–4.0)
MCH: 34.1 pg — ABNORMAL HIGH (ref 26.0–34.0)
MCHC: 35.5 g/dL (ref 30.0–36.0)
MCV: 96 fL (ref 80.0–100.0)
Monocytes Absolute: 1.1 10*3/uL — ABNORMAL HIGH (ref 0.1–1.0)
Monocytes Relative: 20 %
Neutro Abs: 1.8 10*3/uL (ref 1.7–7.7)
Neutrophils Relative %: 33 %
Platelet Count: 245 10*3/uL (ref 150–400)
RBC: 3.49 MIL/uL — ABNORMAL LOW (ref 3.87–5.11)
RDW: 13.9 % (ref 11.5–15.5)
WBC Count: 5.4 10*3/uL (ref 4.0–10.5)
nRBC: 0 % (ref 0.0–0.2)

## 2022-07-19 LAB — CMP (CANCER CENTER ONLY)
ALT: 28 U/L (ref 0–44)
AST: 34 U/L (ref 15–41)
Albumin: 3.2 g/dL — ABNORMAL LOW (ref 3.5–5.0)
Alkaline Phosphatase: 65 U/L (ref 38–126)
Anion gap: 8 (ref 5–15)
BUN: 8 mg/dL (ref 8–23)
CO2: 30 mmol/L (ref 22–32)
Calcium: 8.7 mg/dL — ABNORMAL LOW (ref 8.9–10.3)
Chloride: 101 mmol/L (ref 98–111)
Creatinine: 0.81 mg/dL (ref 0.44–1.00)
GFR, Estimated: >60 mL/min (ref >60)
Glucose, Bld: 150 mg/dL — ABNORMAL HIGH (ref 70–99)
Potassium: 3.6 mmol/L (ref 3.5–5.1)
Sodium: 139 mmol/L (ref 135–145)
Total Bilirubin: 0.4 mg/dL (ref 0.3–1.2)
Total Protein: 6.9 g/dL (ref 6.5–8.1)

## 2022-07-19 LAB — TSH: TSH: 2.621 u[IU]/mL (ref 0.350–4.500)

## 2022-07-19 MED ORDER — SODIUM CHLORIDE 0.9 % IV SOLN: Freq: Once | INTRAVENOUS | Status: AC

## 2022-07-19 MED ORDER — SODIUM CHLORIDE 0.9 % IV SOLN
10.0000 mg/kg | Freq: Once | INTRAVENOUS | Status: AC
  Administered 2022-07-19: 700 mg via INTRAVENOUS
  Filled 2022-07-19: qty 16

## 2022-07-19 NOTE — Progress Notes (Signed)
[facility], [place] [address] [phone]   Interval Evaluation  Date of Service: 07/19/22 Patient Name: Virginia [name] Patient MRN: [id number] Patient DOB: 1956/08/02 Provider: Ventura Sellers, MD  Identifying Statement:  Virginia [name] is a 66 y.o. 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 Isidore Moos 12/02/20: Craniotomy, resection of progressive cerebellar mass by Dr. Vertell Limber.  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  There had been a delay because of tooth extraction procedure. She continues to experience high overall burden of dizziness, vertigo.  This still limits her ability to walk and perform activities of daily living.  Sleep burden has been very high, 16 hours.  Hasn't been active with PT exercises.  Continues on Pem/pem with Dr. DLorenso Courier  [place]  H+P (10/15/19) Patient presents to clinic to discuss recent neurologic symptoms and imaging findings.  She describes a fall while climbing stairs on 1/20; she hit the back of her and lost consciousness.  She is unclear whether she lost her balance or had mechanical provocation, but she had been drinking that evening.  She was treated initially as a trauma, CT demonstrated a lesion in the posterior fossa, later confirmed as a metastasis by MRI, along with 7 additional smaller lesions.  It took her almost two weeks to return for cognition and balance to return to "normal" which she feels she is about at right now.  Baseline she is high functioning and independent, works as a bNeurosurgeon  Has been  taking decadron 243mtwice per day.  Plans to initiate chemotherapy soon with Dr. DoLorenso Courier Medications: Current Outpatient Medications on File Prior to Visit  Medication Sig Dispense Refill   acetaminophen (TYLENOL) 500 MG tablet Take 500 mg by mouth 2 (two) times daily as needed for moderate pain.     albuterol (VENTOLIN HFA) 108 (90 Base) MCG/ACT inhaler INHALE 2 PUFFS INTO THE LUNGS EVERY 6 HOURS AS NEEDED FOR WHEEZING OR SHORTNESS OF BREATH 6.7 g 11   ALPRAZolam (XANAX) 0.25 MG tablet Take 1 tablet (0.25 mg total) by mouth 2 (two) times daily as needed for anxiety 60 tablet 1   amLODipine (NORVASC) 10 MG tablet Take 1 tablet (10 mg total) by mouth daily. 90 tablet 3   Cyanocobalamin (VITAMIN B12 PO) Take 1 tablet by mouth daily. Gummies     EPINEPHrine 0.3 mg/0.3 mL IJ SOAJ injection Inject 0.3 mg into the muscle as needed for anaphylaxis. 2 each 0   folic acid (FOLVITE) 1 MG tablet Take 1 tablet by mouth daily. 90 tablet 1   furosemide (LASIX) 20 MG tablet Take 2 tablets (40 mg total) by mouth 2 times daily. 360 tablet 0   Glucosamine HCl (GLUCOSAMINE PO) Take 2 tablets by mouth daily.     lidocaine-prilocaine (EMLA) cream Apply to port as needed. 30 g 0   LYSINE PO Take 1 tablet by mouth daily.     meclizine (ANTIVERT) 25 MG tablet Take 1 tablet (25 mg total) by mouth 3 (three) times daily as needed for dizziness. 30 tablet 0   Multiple Vitamin (MULTIVITAMIN WITH MINERALS)  TABS tablet Take 1 tablet by mouth daily.     Multiple Vitamins-Minerals (AIRBORNE PO) Take 1 tablet by mouth daily.      OLANZapine (ZYPREXA) 2.5 MG tablet TAKE 1 TABLET BY MOUTH AT BEDTIME 90 tablet 1   OVER THE COUNTER MEDICATION Take 2 tablets by mouth daily. Beet root     oxyCODONE (OXY IR/ROXICODONE) 5 MG immediate release tablet TAKE 1 TO 2 TABLETS BY MOUTH EVERY 4 HOURS AS NEEDED FOR SEVERE PAIN 90 tablet 0   pantoprazole (PROTONIX) 40 MG tablet TAKE 1 TABLET BY MOUTH ONCE DAILY 90 tablet 3   polyethylene glycol  (MIRALAX / GLYCOLAX) 17 g packet Take 17 g by mouth every evening.     potassium chloride SA (KLOR-CON M) 20 MEQ tablet Take 2 tablets (40 mEq total) by mouth 2 (two) times daily. 360 tablet 0   Tetrahydrozoline HCl (VISINE OP) Place 1 drop into both eyes daily as needed (irritation).     VITAMIN D PO Take 1 capsule by mouth daily.     vitamin E 180 MG (400 UNITS) capsule Take 400 IU daily x 1 week, then 400 IU BID (Patient taking differently: Take 400 Units by mouth daily.) 60 capsule 4   XTAMPZA ER 13.5 MG C12A Take 1 capsule by mouth in the morning and at bedtime. (every 12 hours) 60 capsule 0   No current facility-administered medications on file prior to visit.    Allergies: No Known Allergies Past Medical History:  Past Medical History:  Diagnosis Date   Anemia    Anxiety    Concussion 09/28/2019   DVT (deep venous thrombosis) ([facility]) 2021   L leg   Dyspnea    GERD (gastroesophageal reflux disease)    Hypercholesterolemia    per pt, she does not have elevated lipids   Hypertension    met lung ca dx'd 09/2019   mets to spine, hip and brain   PONV (postoperative nausea and vomiting)    Tobacco abuse    Past Surgical History:  Past Surgical History:  Procedure Laterality Date   ABDOMINAL HYSTERECTOMY     partial/ left ovaries   APPLICATION OF CRANIAL NAVIGATION N/A 12/02/2020   Procedure: APPLICATION OF CRANIAL NAVIGATION;  Surgeon: Erline Levine, MD;  Location: [facility];  Service: Neurosurgery;  Laterality: N/A;   CHOLECYSTECTOMY     CRANIOTOMY N/A 12/02/2020   Procedure: Posterior fossa craniotomy for tumor resection with brainlab;  Surgeon: Erline Levine, MD;  Location: [facility];  Service: Neurosurgery;  Laterality: N/A;   DILATION AND CURETTAGE OF UTERUS     IR IMAGING GUIDED PORT INSERTION  10/23/2019   IR PATIENT EVAL TECH 0-60 MINS  11/21/2021   IR PATIENT EVAL TECH 0-60 MINS  11/24/2021   IR PATIENT EVAL TECH 0-60 MINS  11/29/2021   IR PATIENT EVAL TECH 0-60 MINS  12/04/2021    IR PATIENT EVAL TECH 0-60 MINS  12/12/2021   IR PATIENT EVAL TECH 0-60 MINS  12/19/2021   IR PATIENT EVAL TECH 0-60 MINS  12/27/2021   IR PATIENT EVAL TECH 0-60 MINS  01/03/2022   IR PATIENT EVAL TECH 0-60 MINS  01/10/2022   IR PATIENT EVAL TECH 0-60 MINS  01/18/2022   IR PATIENT EVAL TECH 0-60 MINS  02/13/2022   IR PATIENT EVAL TECH 0-60 MINS  02/20/2022   IR RADIOLOGIST EVAL & MGMT  01/23/2022   IR REMOVAL TUN ACCESS W/ PORT W/O FL MOD SED  11/17/2021   KYPHOPLASTY N/A  03/15/2020   Procedure: Thoracic Eight KYPHOPLASTY;  Surgeon: Erline Levine, MD;  Location: [facility];  Service: Neurosurgery;  Laterality: N/A;  prone    LIPOMA EXCISION  2018   removed under left breast and right thigh.   TUBAL LIGATION     Social History:  Social History   Socioeconomic History   Marital status: Married    Spouse name: Not on file   Number of children: 3   Years of education: Not on file   Highest education level: Not on file  Occupational History   Occupation: owns Academic librarian co    Employer: [employer]  Tobacco Use   Smoking status: Former    Packs/day: 0.25    Types: Cigarettes    Quit date: 10/31/2012    Years since quitting: 9.7   Smokeless tobacco: Never  Vaping Use   Vaping Use: Never used  Substance and Sexual Activity   Alcohol use: Yes    Alcohol/week: 0.0 standard drinks of alcohol    Comment: rare   Drug use: No   Sexual activity: Yes  Other Topics Concern   Not on file  Social History Narrative   Not on file   Social Determinants of Health   Financial Resource Strain: Not on file  Food Insecurity: Not on file  Transportation Needs: Not on file  Physical Activity: Not on file  Stress: Not on file  Social Connections: Not on file  Intimate Partner Violence: Not on file   Family History:  Family History  Problem Relation Age of Onset   Heart disease Father    Drug abuse Daughter    Drug abuse Son    Cancer Sister    Heart disease Brother    Heart attack Brother      Review of Systems: Constitutional: Doesn't report fevers, chills or abnormal weight loss Eyes: Doesn't report blurriness of vision Ears, nose, mouth, throat, and face: Doesn't report sore throat Respiratory: Doesn't report cough, dyspnea or wheezes Cardiovascular: Doesn't report palpitation, chest discomfort  Gastrointestinal:  Doesn't report nausea, constipation, diarrhea GU: Doesn't report incontinence Skin: Doesn't report skin rashes Neurological: Per HPI Musculoskeletal: Doesn't report joint pain Behavioral/Psych: Doesn't report anxiety  Physical Exam: Wt Readings from Last 3 Encounters:  07/19/22 142 lb 4.8 oz (64.5 kg)  07/05/22 139 lb 12.8 oz (63.4 kg)  06/14/22 142 lb 14.4 oz (64.8 kg)   Temp Readings from Last 3 Encounters:  07/19/22 98.2 F (36.8 C) (Temporal)  07/05/22 98.5 F (36.9 C) (Oral)  06/14/22 98 F (36.7 C) (Oral)   BP Readings from Last 3 Encounters:  07/19/22 (!) 128/94  07/05/22 (!) 122/90  06/14/22 (!) 115/90   Pulse Readings from Last 3 Encounters:  07/19/22 (!) 102  07/05/22 97  06/14/22 (!) 104    KPS: 60. General: Wheelchair bound  Head: Normal EENT: No conjunctival injection or scleral icterus.  Lungs: Resp effort normal Cardiac: Regular rate Abdomen: Non-distended abdomen Skin: No rashes cyanosis or petechiae. Extremities: No clubbing or edema  Neurologic Exam: Mental Status: Awake, alert, attentive to examiner. Oriented to self and environment. Language is fluent with intact comprehension.  Cranial Nerves: Visual acuity is grossly normal. Noted dysarthria. Visual fields are full. Extra-ocular movements intact. No ptosis. Face is symmetric Motor: Tone and bulk are normal. Power is full in both arms and legs. Reflexes are symmetric, no pathologic reflexes present.  Dense dysmetria, coordination impaired bilaterally. Sensory: Intact to light touch Gait: Non ambulatory today  Labs: I have  reviewed the data as listed     Component Value Date/Time   NA 139 07/19/2022 1026   K 3.6 07/19/2022 1026   CL 101 07/19/2022 1026   CO2 30 07/19/2022 1026   GLUCOSE 150 (H) 07/19/2022 1026   BUN 8 07/19/2022 1026   CREATININE 0.81 07/19/2022 1026   CREATININE 0.58 11/15/2017 0948   CALCIUM 8.7 (L) 07/19/2022 1026   PROT 6.9 07/19/2022 1026   ALBUMIN 3.2 (L) 07/19/2022 1026   AST 34 07/19/2022 1026   ALT 28 07/19/2022 1026   ALKPHOS 65 07/19/2022 1026   BILITOT 0.4 07/19/2022 1026   GFRNONAA >60 07/19/2022 1026   GFRNONAA 100 11/15/2017 0948   GFRAA >60 06/09/2020 1051   GFRAA 116 11/15/2017 0948   Lab Results  Component Value Date   WBC 5.4 07/19/2022   NEUTROABS 1.8 07/19/2022   HGB 11.9 (L) 07/19/2022   HCT 33.5 (L) 07/19/2022   MCV 96.0 07/19/2022   PLT 245 07/19/2022     Assessment/Plan Radiation therapy induced brain necrosis - Plan: Clinic Appointment Request   Virginia [name] is clinically stable today, now having resumed avastin therapy on 07/05/22 following delay for oral surgery.  Avastin is being infused concurrently with pemetrexed and pembrolizumab.  Unfortunately the treatment has not led to hoped-for improvement in vertigo symptom burden.  We discussed and recommended an additional cycle of avastin, 18m/kg IV q3 weeks for refractory radionecrosis.    Avastin should be held for the following:  ANC less than 500  Platelets less than 50,000  LFT or creatinine greater than 2x ULN  If clinical concerns/contraindications develop  Will con't to follow with DLorenso Courierfor systemic therapy.    For vertigo, can increase meclizine dose to 522mfor PRN TID if helpful.  We appreciate the opportunity to participate in the care of Virginia [name] She will follow up in early December after MRI scan.  We ask that Virginia ASMUSeturn to clinic in 1 months following next brain MRI, or sooner as needed.  All questions were answered. The patient knows to call the clinic with any problems,  questions or concerns. No barriers to learning were detected.  The total time spent in the encounter was 30 minutes and more than 50% was on counseling and review of test results   ZaVentura SellersMD Medical Director of Neuro-Oncology [facility]St[facility]1/09/23 10:59 AM

## 2022-07-19 NOTE — Patient Instructions (Signed)
[facility] ONCOLOGY  Discharge Instructions: Thank you for choosing [facility] to provide your oncology and hematology care.   If you have a lab appointment with the [facility], please go directly to the [facility] and check in at the registration area.   Wear comfortable clothing and clothing appropriate for easy access to any Portacath or PICC line.   We strive to give you quality time with your provider. You may need to reschedule your appointment if you arrive late (15 or more minutes).  Arriving late affects you and other patients whose appointments are after yours.  Also, if you miss three or more appointments without notifying the office, you may be dismissed from the clinic at the provider's discretion.      For prescription refill requests, have your pharmacy contact our office and allow 72 hours for refills to be completed.    Today you received the following chemotherapy and/or immunotherapy agents: bevacizumab   To help prevent nausea and vomiting after your treatment, we encourage you to take your nausea medication as directed.  BELOW ARE SYMPTOMS THAT SHOULD BE REPORTED IMMEDIATELY: *FEVER GREATER THAN 100.4 F (38 C) OR HIGHER *CHILLS OR SWEATING *NAUSEA AND VOMITING THAT IS NOT CONTROLLED WITH YOUR NAUSEA MEDICATION *UNUSUAL SHORTNESS OF BREATH *UNUSUAL BRUISING OR BLEEDING *URINARY PROBLEMS (pain or burning when urinating, or frequent urination) *BOWEL PROBLEMS (unusual diarrhea, constipation, pain near the anus) TENDERNESS IN MOUTH AND THROAT WITH OR WITHOUT PRESENCE OF ULCERS (sore throat, sores in mouth, or a toothache) UNUSUAL RASH, SWELLING OR PAIN  UNUSUAL VAGINAL DISCHARGE OR ITCHING   Items with * indicate a potential emergency and should be followed up as soon as possible or go to the Emergency Department if any problems should occur.  Please show the CHEMOTHERAPY ALERT CARD or IMMUNOTHERAPY ALERT CARD at check-in to  the Emergency Department and triage nurse.  Should you have questions after your visit or need to cancel or reschedule your appointment, please contact [facility]  Dept: [phone]  and follow the prompts.  Office hours are 8:00 a.m. to 4:30 p.m. Monday - Friday. Please note that voicemails left after 4:00 p.m. may not be returned until the following business day.  We are closed weekends and major holidays. You have access to a nurse at all times for urgent questions. Please call the main number to the clinic Dept: [phone] and follow the prompts.   For any non-urgent questions, you may also contact your provider using MyChart. We now offer e-Visits for anyone 85 and older to request care online for non-urgent symptoms. For details visit [email or web address].   Also download the MyChart app! Go to the app store, search "MyChart", open the app, select [facility], and log in with your MyChart username and password.  Masks are optional in the cancer centers. If you would like for your care team to wear a mask while they are taking care of you, please let them know. You may have one support person who is at least 65 years old accompany you for your appointments.

## 2022-07-19 NOTE — Progress Notes (Signed)
Ok to treat 07/19/2022 with Avastin without urine protein per Dr Mickeal Skinner.

## 2022-07-20 ENCOUNTER — Telehealth: Payer: Self-pay | Admitting: *Deleted

## 2022-07-20 NOTE — Telephone Encounter (Signed)
TCT pt's husband, [name] regarding recent CT scan CAP. Spoke with [name]. Advised that CT scan showed nor new disease and stable disease otherwise. [name] happy with this of course. No refills needed prior to trip to [place] next week. They will return 08/10/22

## 2022-07-20 NOTE — Telephone Encounter (Signed)
-----   Message from Orson Slick, MD sent at 07/15/2022  8:44 AM EST ----- Please let Mr. And Mrs. Hull know that the CT scan showed stable disease in the chest/ab/pelvis. No evidence of progression or new sites of disease.  ----- Message ----- From: Interface, Rad Results In Sent: 07/09/2022  11:38 AM EST To: Orson Slick, MD

## 2022-07-25 ENCOUNTER — Ambulatory Visit: Payer: BC Managed Care – PPO | Admitting: Physician Assistant

## 2022-07-25 ENCOUNTER — Ambulatory Visit: Payer: BC Managed Care – PPO

## 2022-07-25 ENCOUNTER — Other Ambulatory Visit: Payer: BC Managed Care – PPO

## 2022-07-27 ENCOUNTER — Ambulatory Visit: Payer: Medicare Other | Admitting: Physician Assistant

## 2022-07-27 ENCOUNTER — Ambulatory Visit: Payer: Medicare Other

## 2022-07-27 ENCOUNTER — Other Ambulatory Visit: Payer: Medicare Other

## 2022-08-10 ENCOUNTER — Ambulatory Visit
Admission: RE | Admit: 2022-08-10 | Discharge: 2022-08-10 | Disposition: A | Discharge status: Home / Self Care | Payer: Medicare Other | Source: Ambulatory Visit | Attending: Internal Medicine | Admitting: Internal Medicine

## 2022-08-10 DIAGNOSIS — C7931 Secondary malignant neoplasm of brain: Secondary | ICD-10-CM

## 2022-08-10 DIAGNOSIS — Y842 Radiological procedure and radiotherapy as the cause of abnormal reaction of the patient, or of later complication, without mention of misadventure at the time of the procedure: Secondary | ICD-10-CM

## 2022-08-10 DIAGNOSIS — I6789 Other cerebrovascular disease: Secondary | ICD-10-CM | POA: Diagnosis not present

## 2022-08-10 MED ORDER — GADOPICLENOL 0.5 MMOL/ML IV SOLN
6.5000 mL | Freq: Once | INTRAVENOUS | Status: AC | PRN
  Administered 2022-08-10: 6.5 mL via INTRAVENOUS

## 2022-08-13 ENCOUNTER — Inpatient Hospital Stay: Payer: Medicare Other

## 2022-08-13 ENCOUNTER — Inpatient Hospital Stay (HOSPITAL_BASED_OUTPATIENT_CLINIC_OR_DEPARTMENT_OTHER): Payer: Medicare Other | Admitting: Internal Medicine

## 2022-08-13 ENCOUNTER — Inpatient Hospital Stay: Payer: Medicare Other | Attending: Hematology and Oncology | Admitting: Hematology and Oncology

## 2022-08-13 ENCOUNTER — Other Ambulatory Visit: Payer: Self-pay

## 2022-08-13 ENCOUNTER — Inpatient Hospital Stay: Payer: Medicare Other | Admitting: Hematology and Oncology

## 2022-08-13 VITALS — BP 127/100 | HR 104 | Temp 97.6°F | Resp 17 | Wt 141.9 lb

## 2022-08-13 VITALS — BP 122/95 | HR 99

## 2022-08-13 DIAGNOSIS — Z5112 Encounter for antineoplastic immunotherapy: Secondary | ICD-10-CM | POA: Diagnosis not present

## 2022-08-13 DIAGNOSIS — I6789 Other cerebrovascular disease: Secondary | ICD-10-CM | POA: Insufficient documentation

## 2022-08-13 DIAGNOSIS — C7951 Secondary malignant neoplasm of bone: Secondary | ICD-10-CM | POA: Diagnosis not present

## 2022-08-13 DIAGNOSIS — R918 Other nonspecific abnormal finding of lung field: Secondary | ICD-10-CM

## 2022-08-13 DIAGNOSIS — Z809 Family history of malignant neoplasm, unspecified: Secondary | ICD-10-CM | POA: Insufficient documentation

## 2022-08-13 DIAGNOSIS — Z5111 Encounter for antineoplastic chemotherapy: Secondary | ICD-10-CM | POA: Diagnosis not present

## 2022-08-13 DIAGNOSIS — Z8673 Personal history of transient ischemic attack (TIA), and cerebral infarction without residual deficits: Secondary | ICD-10-CM | POA: Diagnosis not present

## 2022-08-13 DIAGNOSIS — Z923 Personal history of irradiation: Secondary | ICD-10-CM | POA: Insufficient documentation

## 2022-08-13 DIAGNOSIS — E876 Hypokalemia: Secondary | ICD-10-CM | POA: Diagnosis not present

## 2022-08-13 DIAGNOSIS — Z9049 Acquired absence of other specified parts of digestive tract: Secondary | ICD-10-CM | POA: Diagnosis not present

## 2022-08-13 DIAGNOSIS — C7931 Secondary malignant neoplasm of brain: Secondary | ICD-10-CM

## 2022-08-13 DIAGNOSIS — Y842 Radiological procedure and radiotherapy as the cause of abnormal reaction of the patient, or of later complication, without mention of misadventure at the time of the procedure: Secondary | ICD-10-CM

## 2022-08-13 DIAGNOSIS — R42 Dizziness and giddiness: Secondary | ICD-10-CM | POA: Insufficient documentation

## 2022-08-13 DIAGNOSIS — E032 Hypothyroidism due to medicaments and other exogenous substances: Secondary | ICD-10-CM

## 2022-08-13 DIAGNOSIS — Z86718 Personal history of other venous thrombosis and embolism: Secondary | ICD-10-CM | POA: Insufficient documentation

## 2022-08-13 DIAGNOSIS — C3490 Malignant neoplasm of unspecified part of unspecified bronchus or lung: Secondary | ICD-10-CM | POA: Diagnosis not present

## 2022-08-13 DIAGNOSIS — C3431 Malignant neoplasm of lower lobe, right bronchus or lung: Secondary | ICD-10-CM | POA: Insufficient documentation

## 2022-08-13 DIAGNOSIS — Z79899 Other long term (current) drug therapy: Secondary | ICD-10-CM | POA: Diagnosis not present

## 2022-08-13 DIAGNOSIS — Z87891 Personal history of nicotine dependence: Secondary | ICD-10-CM | POA: Diagnosis not present

## 2022-08-13 DIAGNOSIS — Z814 Family history of other substance abuse and dependence: Secondary | ICD-10-CM | POA: Insufficient documentation

## 2022-08-13 DIAGNOSIS — Z7901 Long term (current) use of anticoagulants: Secondary | ICD-10-CM | POA: Insufficient documentation

## 2022-08-13 DIAGNOSIS — Z8249 Family history of ischemic heart disease and other diseases of the circulatory system: Secondary | ICD-10-CM | POA: Diagnosis not present

## 2022-08-13 LAB — CMP (CANCER CENTER ONLY)
ALT: 10 U/L (ref 0–44)
AST: 19 U/L (ref 15–41)
Albumin: 3.7 g/dL (ref 3.5–5.0)
Alkaline Phosphatase: 63 U/L (ref 38–126)
Anion gap: 9 (ref 5–15)
BUN: 9 mg/dL (ref 8–23)
CO2: 32 mmol/L (ref 22–32)
Calcium: 9.6 mg/dL (ref 8.9–10.3)
Chloride: 97 mmol/L — ABNORMAL LOW (ref 98–111)
Creatinine: 0.69 mg/dL (ref 0.44–1.00)
GFR, Estimated: >60 mL/min (ref >60)
Glucose, Bld: 102 mg/dL — ABNORMAL HIGH (ref 70–99)
Potassium: 3 mmol/L — ABNORMAL LOW (ref 3.5–5.1)
Sodium: 138 mmol/L (ref 135–145)
Total Bilirubin: 0.5 mg/dL (ref 0.3–1.2)
Total Protein: 7.6 g/dL (ref 6.5–8.1)

## 2022-08-13 LAB — CBC WITH DIFFERENTIAL (CANCER CENTER ONLY)
Abs Immature Granulocytes: 0.01 10*3/uL (ref 0.00–0.07)
Basophils Absolute: 0.1 10*3/uL (ref 0.0–0.1)
Basophils Relative: 1 %
Eosinophils Absolute: 0.2 10*3/uL (ref 0.0–0.5)
Eosinophils Relative: 3 %
HCT: 40.3 % (ref 36.0–46.0)
Hemoglobin: 14.2 g/dL (ref 12.0–15.0)
Immature Granulocytes: 0 %
Lymphocytes Relative: 33 %
Lymphs Abs: 2.1 10*3/uL (ref 0.7–4.0)
MCH: 33.6 pg (ref 26.0–34.0)
MCHC: 35.2 g/dL (ref 30.0–36.0)
MCV: 95.5 fL (ref 80.0–100.0)
Monocytes Absolute: 0.7 10*3/uL (ref 0.1–1.0)
Monocytes Relative: 11 %
Neutro Abs: 3.2 10*3/uL (ref 1.7–7.7)
Neutrophils Relative %: 52 %
Platelet Count: 249 10*3/uL (ref 150–400)
RBC: 4.22 MIL/uL (ref 3.87–5.11)
RDW: 12.9 % (ref 11.5–15.5)
WBC Count: 6.2 10*3/uL (ref 4.0–10.5)
nRBC: 0 % (ref 0.0–0.2)

## 2022-08-13 LAB — TSH: TSH: 1.897 u[IU]/mL (ref 0.350–4.500)

## 2022-08-13 MED ORDER — SODIUM CHLORIDE 0.9 % IV SOLN
200.0000 mg | Freq: Once | INTRAVENOUS | Status: AC
  Administered 2022-08-13: 200 mg via INTRAVENOUS
  Filled 2022-08-13: qty 200

## 2022-08-13 MED ORDER — SODIUM CHLORIDE 0.9 % IV SOLN: Freq: Once | INTRAVENOUS | Status: AC

## 2022-08-13 MED ORDER — PROCHLORPERAZINE MALEATE 10 MG PO TABS
10.0000 mg | ORAL_TABLET | Freq: Once | ORAL | Status: AC
  Administered 2022-08-13: 10 mg via ORAL
  Filled 2022-08-13: qty 1

## 2022-08-13 MED ORDER — SODIUM CHLORIDE 0.9 % IV SOLN
500.0000 mg/m2 | Freq: Once | INTRAVENOUS | Status: AC
  Administered 2022-08-13: 900 mg via INTRAVENOUS
  Filled 2022-08-13: qty 20

## 2022-08-13 NOTE — Patient Instructions (Signed)
[facility] ONCOLOGY  Discharge Instructions: Thank you for choosing [facility] to provide your oncology and hematology care.   If you have a lab appointment with the [facility], please go directly to the [facility] and check in at the registration area.   Wear comfortable clothing and clothing appropriate for easy access to any Portacath or PICC line.   We strive to give you quality time with your provider. You may need to reschedule your appointment if you arrive late (15 or more minutes).  Arriving late affects you and other patients whose appointments are after yours.  Also, if you miss three or more appointments without notifying the office, you may be dismissed from the clinic at the provider's discretion.      For prescription refill requests, have your pharmacy contact our office and allow 72 hours for refills to be completed.    Today you received the following chemotherapy and/or immunotherapy agents: pembrolizumab, pemetrexed      To help prevent nausea and vomiting after your treatment, we encourage you to take your nausea medication as directed.  BELOW ARE SYMPTOMS THAT SHOULD BE REPORTED IMMEDIATELY: *FEVER GREATER THAN 100.4 F (38 C) OR HIGHER *CHILLS OR SWEATING *NAUSEA AND VOMITING THAT IS NOT CONTROLLED WITH YOUR NAUSEA MEDICATION *UNUSUAL SHORTNESS OF BREATH *UNUSUAL BRUISING OR BLEEDING *URINARY PROBLEMS (pain or burning when urinating, or frequent urination) *BOWEL PROBLEMS (unusual diarrhea, constipation, pain near the anus) TENDERNESS IN MOUTH AND THROAT WITH OR WITHOUT PRESENCE OF ULCERS (sore throat, sores in mouth, or a toothache) UNUSUAL RASH, SWELLING OR PAIN  UNUSUAL VAGINAL DISCHARGE OR ITCHING   Items with * indicate a potential emergency and should be followed up as soon as possible or go to the Emergency Department if any problems should occur.  Please show the CHEMOTHERAPY ALERT CARD or IMMUNOTHERAPY ALERT CARD  at check-in to the Emergency Department and triage nurse.  Should you have questions after your visit or need to cancel or reschedule your appointment, please contact [facility]  Dept: [phone]  and follow the prompts.  Office hours are 8:00 a.m. to 4:30 p.m. Monday - Friday. Please note that voicemails left after 4:00 p.m. may not be returned until the following business day.  We are closed weekends and major holidays. You have access to a nurse at all times for urgent questions. Please call the main number to the clinic Dept: [phone] and follow the prompts.   For any non-urgent questions, you may also contact your provider using MyChart. We now offer e-Visits for anyone 32 and older to request care online for non-urgent symptoms. For details visit [email or web address].   Also download the MyChart app! Go to the app store, search "MyChart", open the app, select [facility], and log in with your MyChart username and password.  Masks are optional in the cancer centers. If you would like for your care team to wear a mask while they are taking care of you, please let them know. You may have one support Virginia [name] who is at least 65 years old accompany you for your appointments.

## 2022-08-13 NOTE — Progress Notes (Signed)
[facility] [place], [place] [address] [phone]   Interval Evaluation  Date of Service: 08/13/22 Patient Name: Virginia [name] Patient MRN: [id number] Patient DOB: 04/20/1956 Provider: Ventura Sellers, MD  Identifying Statement:  Virginia [name] is a 66 y.o. 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 Isidore Moos 12/02/20: Craniotomy, resection of progressive cerebellar mass by Dr. Vertell Limber.  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  She continues to experience high overall burden of dizziness, vertigo which limits her ability to walk and perform activities of daily living.  There may have been modest improvement in the past week or two.  Sleep burden remains high, 16 hours.  Continues on Pem/pem with Dr. DLorenso Courier  [place], she did have fall at home hitting her head prior to leaving for trip.  H+P (10/15/19) Patient presents to clinic to discuss recent neurologic symptoms and imaging findings.  She describes a fall while climbing stairs on 1/20; she hit the back of her and lost consciousness.  She is unclear whether she lost her balance or had mechanical provocation, but she had been drinking that evening.  She was treated initially as a trauma, CT demonstrated a lesion in the posterior fossa, later confirmed as a metastasis by MRI, along with 7 additional smaller lesions.  It took her almost two weeks to return for cognition and balance to return to "normal" which she feels she is about at right now.  Baseline she is high functioning and independent, works as a bNeurosurgeon  Has  been taking decadron 279mtwice per day.  Plans to initiate chemotherapy soon with Dr. DoLorenso Courier Medications: Current Outpatient Medications on File Prior to Visit  Medication Sig Dispense Refill   acetaminophen (TYLENOL) 500 MG tablet Take 500 mg by mouth 2 (two) times daily as needed for moderate pain.     albuterol (VENTOLIN HFA) 108 (90 Base) MCG/ACT inhaler INHALE 2 PUFFS INTO THE LUNGS EVERY 6 HOURS AS NEEDED FOR WHEEZING OR SHORTNESS OF BREATH 6.7 g 11   ALPRAZolam (XANAX) 0.25 MG tablet Take 1 tablet (0.25 mg total) by mouth 2 (two) times daily as needed for anxiety 60 tablet 1   amLODipine (NORVASC) 10 MG tablet Take 1 tablet (10 mg total) by mouth daily. 90 tablet 3   Cyanocobalamin (VITAMIN B12 PO) Take 1 tablet by mouth daily. Gummies     EPINEPHrine 0.3 mg/0.3 mL IJ SOAJ injection Inject 0.3 mg into the muscle as needed for anaphylaxis. 2 each 0   folic acid (FOLVITE) 1 MG tablet Take 1 tablet by mouth daily. 90 tablet 1   furosemide (LASIX) 20 MG tablet Take 2 tablets (40 mg total) by mouth 2 times daily. 360 tablet 0   Glucosamine HCl (GLUCOSAMINE PO) Take 2 tablets by mouth daily.     lidocaine-prilocaine (EMLA) cream Apply to port as needed. 30 g 0   LYSINE PO Take 1 tablet by mouth daily.     meclizine (ANTIVERT) 25 MG tablet Take 1 tablet (25 mg total) by mouth 3 (three) times daily as needed for dizziness. 30 tablet 0   Multiple  Vitamin (MULTIVITAMIN WITH MINERALS) TABS tablet Take 1 tablet by mouth daily.     Multiple Vitamins-Minerals (AIRBORNE PO) Take 1 tablet by mouth daily.      OLANZapine (ZYPREXA) 2.5 MG tablet TAKE 1 TABLET BY MOUTH AT BEDTIME 90 tablet 1   ondansetron (ZOFRAN) 4 MG tablet TAKE 1 TABLET(4 MG) BY MOUTH EVERY 8 HOURS AS NEEDED FOR NAUSEA OR VOMITING 40 tablet 2   OVER THE COUNTER MEDICATION Take 2 tablets by mouth daily. Beet root     oxyCODONE (OXY IR/ROXICODONE) 5 MG immediate release tablet TAKE 1 TO 2 TABLETS BY MOUTH EVERY 4 HOURS AS NEEDED FOR  SEVERE PAIN 90 tablet 0   pantoprazole (PROTONIX) 40 MG tablet TAKE 1 TABLET BY MOUTH ONCE DAILY 90 tablet 3   polyethylene glycol (MIRALAX / GLYCOLAX) 17 g packet Take 17 g by mouth every evening.     potassium chloride SA (KLOR-CON M) 20 MEQ tablet Take 2 tablets (40 mEq total) by mouth 2 (two) times daily. 360 tablet 0   Tetrahydrozoline HCl (VISINE OP) Place 1 drop into both eyes daily as needed (irritation).     VITAMIN D PO Take 1 capsule by mouth daily.     vitamin E 180 MG (400 UNITS) capsule Take 400 IU daily x 1 week, then 400 IU BID (Patient taking differently: Take 400 Units by mouth daily.) 60 capsule 4   XTAMPZA ER 13.5 MG C12A Take 1 capsule by mouth in the morning and at bedtime. (every 12 hours) 60 capsule 0   No current facility-administered medications on file prior to visit.    Allergies: No Known Allergies Past Medical History:  Past Medical History:  Diagnosis Date   Anemia    Anxiety    Concussion 09/28/2019   DVT (deep venous thrombosis) ([facility]) 2021   L leg   Dyspnea    GERD (gastroesophageal reflux disease)    Hypercholesterolemia    per pt, she does not have elevated lipids   Hypertension    met lung ca dx'd 09/2019   mets to spine, hip and brain   PONV (postoperative nausea and vomiting)    Tobacco abuse    Past Surgical History:  Past Surgical History:  Procedure Laterality Date   ABDOMINAL HYSTERECTOMY     partial/ left ovaries   APPLICATION OF CRANIAL NAVIGATION N/A 12/02/2020   Procedure: APPLICATION OF CRANIAL NAVIGATION;  Surgeon: Erline Levine, MD;  Location: [facility];  Service: Neurosurgery;  Laterality: N/A;   CHOLECYSTECTOMY     CRANIOTOMY N/A 12/02/2020   Procedure: Posterior fossa craniotomy for tumor resection with brainlab;  Surgeon: Erline Levine, MD;  Location: [facility];  Service: Neurosurgery;  Laterality: N/A;   DILATION AND CURETTAGE OF UTERUS     IR IMAGING GUIDED PORT INSERTION  10/23/2019   IR PATIENT EVAL TECH 0-60 MINS  11/21/2021    IR PATIENT EVAL TECH 0-60 MINS  11/24/2021   IR PATIENT EVAL TECH 0-60 MINS  11/29/2021   IR PATIENT EVAL TECH 0-60 MINS  12/04/2021   IR PATIENT EVAL TECH 0-60 MINS  12/12/2021   IR PATIENT EVAL TECH 0-60 MINS  12/19/2021   IR PATIENT EVAL TECH 0-60 MINS  12/27/2021   IR PATIENT EVAL TECH 0-60 MINS  01/03/2022   IR PATIENT EVAL TECH 0-60 MINS  01/10/2022   IR PATIENT EVAL TECH 0-60 MINS  01/18/2022   IR PATIENT EVAL TECH 0-60 MINS  02/13/2022   IR PATIENT EVAL TECH 0-60 MINS  02/20/2022   IR RADIOLOGIST EVAL & MGMT  01/23/2022   IR REMOVAL TUN ACCESS W/ PORT W/O FL MOD SED  11/17/2021   KYPHOPLASTY N/A 03/15/2020   Procedure: Thoracic Eight KYPHOPLASTY;  Surgeon: Erline Levine, MD;  Location: [facility];  Service: Neurosurgery;  Laterality: N/A;  prone    LIPOMA EXCISION  2018   removed under left breast and right thigh.   TUBAL LIGATION     Social History:  Social History   Socioeconomic History   Marital status: Married    Spouse name: Not on file   Number of children: 3   Years of education: Not on file   Highest education level: Not on file  Occupational History   Occupation: owns Academic librarian co    Employer: [employer]  Tobacco Use   Smoking status: Former    Packs/day: 0.25    Types: Cigarettes    Quit date: 10/31/2012    Years since quitting: 9.7   Smokeless tobacco: Never  Vaping Use   Vaping Use: Never used  Substance and Sexual Activity   Alcohol use: Yes    Alcohol/week: 0.0 standard drinks of alcohol    Comment: rare   Drug use: No   Sexual activity: Yes  Other Topics Concern   Not on file  Social History Narrative   Not on file   Social Determinants of Health   Financial Resource Strain: Not on file  Food Insecurity: Not on file  Transportation Needs: Not on file  Physical Activity: Not on file  Stress: Not on file  Social Connections: Not on file  Intimate Partner Violence: Not on file   Family History:  Family History  Problem Relation Age of Onset   Heart  disease Father    Drug abuse Daughter    Drug abuse Son    Cancer Sister    Heart disease Brother    Heart attack Brother     Review of Systems: Constitutional: Doesn't report fevers, chills or abnormal weight loss Eyes: Doesn't report blurriness of vision Ears, nose, mouth, throat, and face: Doesn't report sore throat Respiratory: Doesn't report cough, dyspnea or wheezes Cardiovascular: Doesn't report palpitation, chest discomfort  Gastrointestinal:  Doesn't report nausea, constipation, diarrhea GU: Doesn't report incontinence Skin: Doesn't report skin rashes Neurological: Per HPI Musculoskeletal: Doesn't report joint pain Behavioral/Psych: Doesn't report anxiety  Physical Exam: Wt Readings from Last 3 Encounters:  08/13/22 141 lb 14.4 oz (64.4 kg)  07/19/22 142 lb 4.8 oz (64.5 kg)  07/05/22 139 lb 12.8 oz (63.4 kg)   Temp Readings from Last 3 Encounters:  08/13/22 97.6 F (36.4 C) (Oral)  07/19/22 98.1 F (36.7 C) (Oral)  07/19/22 98.2 F (36.8 C) (Temporal)   BP Readings from Last 3 Encounters:  08/13/22 (!) 127/100  07/19/22 (!) 128/96  07/19/22 (!) 128/94   Pulse Readings from Last 3 Encounters:  08/13/22 (!) 104  07/19/22 91  07/19/22 (!) 102    KPS: 60. General: Wheelchair bound  Head: Normal EENT: No conjunctival injection or scleral icterus.  Lungs: Resp effort normal Cardiac: Regular rate Abdomen: Non-distended abdomen Skin: No rashes cyanosis or petechiae. Extremities: No clubbing or edema  Neurologic Exam: Mental Status: Awake, alert, attentive to examiner. Oriented to self and environment. Language is fluent with intact comprehension.  Cranial Nerves: Visual acuity is grossly normal. Noted dysarthria. Visual fields are full. Extra-ocular movements intact. No ptosis. Face is symmetric Motor: Tone and bulk are normal. Power is full in both arms  and legs. Reflexes are symmetric, no pathologic reflexes present.  Dense dysmetria, coordination  impaired bilaterally. Sensory: Intact to light touch Gait: Non ambulatory today  Labs: I have reviewed the data as listed    Component Value Date/Time   NA 138 08/13/2022 1024   K 3.0 (L) 08/13/2022 1024   CL 97 (L) 08/13/2022 1024   CO2 32 08/13/2022 1024   GLUCOSE 102 (H) 08/13/2022 1024   BUN 9 08/13/2022 1024   CREATININE 0.69 08/13/2022 1024   CREATININE 0.58 11/15/2017 0948   CALCIUM 9.6 08/13/2022 1024   PROT 7.6 08/13/2022 1024   ALBUMIN 3.7 08/13/2022 1024   AST 19 08/13/2022 1024   ALT 10 08/13/2022 1024   ALKPHOS 63 08/13/2022 1024   BILITOT 0.5 08/13/2022 1024   GFRNONAA >60 08/13/2022 1024   GFRNONAA 100 11/15/2017 0948   GFRAA >60 06/09/2020 1051   GFRAA 116 11/15/2017 0948   Lab Results  Component Value Date   WBC 6.2 08/13/2022   NEUTROABS 3.2 08/13/2022   HGB 14.2 08/13/2022   HCT 40.3 08/13/2022   MCV 95.5 08/13/2022   PLT 249 08/13/2022    Imaging:  [facility] Clinician Interpretation: I have personally reviewed the CNS images as listed.  My interpretation, in the context of the patient's clinical presentation, is  stable findings, some question of bilateral subdural collection of unclear etiology.  MR BRAIN W WO CONTRAST  Result Date: 08/13/2022 CLINICAL DATA:  Metastatic lung cancer follow-up EXAM: MRI HEAD WITHOUT AND WITH CONTRAST TECHNIQUE: Multiplanar, multiecho pulse sequences of the brain and surrounding structures were obtained without and with intravenous contrast. CONTRAST:  6.5 cc of vueway intravenous COMPARISON:  04/26/2022 FINDINGS: Brain: Previously extensive patchy enhancement in the posterior fossa has subsided. 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 No brain swelling/edema progression. Lesion enhancement in the low right parietal lobe is unchanged at 6 mm on 13:94. The lesion in the posterior left frontal lobe has subsided, essentially not seen on black blood sequence. Lesions in the right occipital lobe on 13:60 and 79 are regressed.  No new lesion is seen. Subdural collections are newly seen along the bilateral cerebral convexity, measuring up to 3 mm without cerebral mass effect. There is generalized dural thickening and hyperenhancement which is considered reactive. No brain sagging or nodular dural thickening. Vascular: Major flow voids and vascular enhancements are preserved Skull and upper cervical spine: Unremarkable left suboccipital craniotomy site. No worrisome bone lesion. Enhancement at the high left parietal vertex is attributed to arachnoid granulation. Sinuses/Orbits: Negative IMPRESSION: 1. New small subdural collections along the bilateral cerebral convexity with reactive appearing dural thickening. Please correlate for recent trauma. 2. Cerebral and cerebellar lesions show generalized regression of enhancement, positive Avastin treatment response. No new lesion. Electronically Signed   By: Jorje Guild M.D.   On: 08/13/2022 07:57     Assessment/Plan Malignant neoplasm metastatic to brain [facility])  Radiation therapy induced brain necrosis   Ms. Dombeck is clinically stable today, now having completed additional avastin therapy for refractory radiation necrosis.  Unfortunately the treatment has not led to hoped-for improvement in vertigo/dystaxia symptom burden.  MRI brain demonstrates reduction again in enhancing volume within posterior fossa s/p avastin.  There is also bilateral subdural enhancement of unclear etiology, this could possibly represent occult hemorrhagic process, possibly provoked by mild head trauma.  We discussed and recommended holding avastin today due to concern for (non acute) CNS hemorrhagic process, without apparent cause aside from avastin effect.  Will con't to follow with [facility] for systemic therapy.    For vertigo, can increase meclizine dose to 38m for PRN TID if helpful.  We appreciate the opportunity to participate in the care of Virginia [name]    We ask that Virginia BEEVERreturn to clinic in 2 months following next brain MRI, or sooner as needed.  All questions were answered. The patient knows to call the clinic with any problems, questions or concerns. No barriers to learning were detected.  The total time spent in the encounter was 30 minutes and more than 50% was on counseling and review of test results   ZVentura Sellers MD Medical Director of Neuro-Oncology [facility]at WL[facility]ed12/04/23 11:32 AM

## 2022-08-13 NOTE — Progress Notes (Signed)
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 CT of the head showed showed concerning for metastatic disease involving the left cerebellum and right occipital lobe.  2) 09/30/2019: MRI brain confirms mass within the left cerebellum measures 1.6 x 1.3 x 1.5 cm as well as at least 8 metastatic lesions within the supratentorial and infratentorial brain as outlined 3) 10/01/2019: PET CT scan revealed hypermetabolic infrahilar right lower lobe mass with hypermetabolic nodal metastases in the right hilum, mediastinum, right supraclavicular region, left axilla and lower left neck. 4) 10/08/2019: US guided biopsy of the lymph nodes confirms poorly differentiated non-small cell carcinoma. Immunohistochemistry confirms adenocarcinoma of lung primary. PD-L1 and NGS later revealed a MET Exon 14 mutation and TPS of 90%.  5) 10/12/2019: Establish care with Dr. Lorenso Courier  6) 11/04/2019: Day 1 of capmatinib 427m BID 7) 11/09/2019: presented to Rad/Onc with a DVT in LLE. Seen in symptom management clinic and started on apixaban.  8) 12/29/2019: CT C/A/P showed interval decrease in size of mass involving the superior segment of right lower lobe and reduction in mediastinal and left supraclavicular adenopathy though some small spinal lesions were noted in the interim between the two sets of scans 9) 01/25/2020: temporarily stopped capmatinib due to symptoms of nausea/fatigue. Restarted therapy on 01/30/2020 after brief chemo holiday.  10)  03/22/2020: CT C/A/P reveals a mixed picture, with new lung nodule and increased lymphadenopathy, however response in bone lesions. D/c capmatinib therapy 11) 04/08/2020: start of Carbo/Pem/Pem for 2nd line treatment. Cycle 1 Day 1   12) 04/29/2020: Cycle 2 Day 1 Carbo/Pem/Pem  13) 05/20/2020: Cycle 3 Day 1 Carbo/Pem/Pem  14) 06/09/2020: Cycle 4 Day 1 Carbo/Pem/Pem  15) 06/29/2020: Cycle 5 Day 1 maintenance Pem/Pem  16) 07/20/2020: Cycle 6 Day 1 maintenance Pem/Pem  17) 08/12/2020: Cycle 7 Day 1 maintenance Pem/Pem  18) 09/01/2020: Cycle 8 Day 1 maintenance Pem/Pem  19) 09/21/2020: Cycle 9 Day 1 maintenance Pem/Pem  20) 10/13/2020: Cycle 10 Day 1 maintenance Pem/Pem  21) 11/03/2020: Cycle 11 Day 1 maintenance Pem/Pem  22) 11/25/2020:  Cycle 12 Day 1 maintenance Pem/Pem 23) 12/15/2020:  Cycle 13 Day 1 maintenance Pem/Pem 24) 01/05/2021:  Cycle 14 Day 1 maintenance Pem/Pem 25) 01/26/2021:  Cycle 15 Day 1 maintenance Pem/Pem plus Bevacizumab.  02/17/2021: Cycle 16 Day 1 maintenance Pem/Pem plus Bevacizumab.  03/10/2021: Cycle 17 Day 1 maintenance Pem/Pem plus Bevacizumab.  03/31/2021: Cycle 18 Day 1 maintenance Pem/Pem  04/20/2021:  Cycle 19 Day 1 maintenance Pem/Pem  05/12/2021: Cycle 20 Day 1 maintenance Pem/Pem 06/01/2021: Cycle 21 Day 1 maintenance Pem/Pem 06/22/2021: Cycle 22 Day 1 maintenance Pem/Pem 07/13/2021: Cycle 23 Day 1 maintenance Pem/Pem 08/16/2021: Cycle 24 Day 1 maintenance Pem/Pem 09/08/2021: Cycle 25 Day 1 maintenance Pem/Pem plus Bevacizumab 09/28/2021: Cycle 26 Day 1 maintenance Pem/Pem plus Bevacizumab 10/20/2021: Cycle 27 Day 1 maintenance Pem/Pem plus Bevacizumab 11/09/2021: Cycle 28 Day 1 maintenance Pem/Pem  12/06/2021: Cycle 29 Day 1 maintenance Pem/Pem  12/27/2021: Cycle 30 Day 1 maintenance Pem/Pem  01/18/2022: Cycle 31 Day 1 maintenance Pem/Pem  02/07/2022: Cycle 32 Day  1 maintenance Pem/Pem  02/28/2022: Cycle 33 Day 1 maintenance Pem/Pem  03/23/2022: Cycle 34 Day 1 maintenance Pem/Pem   04/13/2022: Cycle 35 Day 1 maintenance Pem/Pem  05/04/2022: Cycle 36 Day 1 maintenance Pem/Pem  05/25/2022: Cycle 37 Day 1 maintenance Pem/Pem  06/14/2022: Cycle 38 Day 1 maintenance Pem/Pem 07/05/2022: Cycle 39 Day 1 maintenance Pem/Pem 08/13/2022: Cycle 40 Day 1 maintenance Pem/Pem  Interval History:  Virginia [name] 66 y.o. female with medical history significant for metastatic adenocarcinoma of the lung presents for a follow up visit. The patient's last visit was on 07/05/2022.  In the interim since the last visit she had another trip to [place] which explains the gap between cycle 39 and 40. She continues on maintenance Pem/Pem.   On exam today Virginia [name] reports that she had a great trip to [place].  She had lots of good food but was not able to get in the water because she was too cold.  She notes that she has not been taking her potassium pills for some time.  She notes that she is been try to drink a banana drink in order to help bolster her potassium levels.  Unfortunately before she left she fell off the toilet and hit her head on the sink.  This is noted in her MRI that she had small subdural hematomas.  She reports she tolerated her last cycle of chemotherapy quite well with no nausea, vomiting, or diarrhea.  She reports her appetite remains strong.  She continues to have her dizziness but thinks the meclizine may be helping.  She continues to take oxycodone approximately 2 to 3/day in addition to her long-acting Xtampza.  She notes that she is otherwise tolerating treatment quite well and is willing and able to proceed at this time.  She denies any fevers, chills, sweats, vomiting, diarrhea.  Full 10 point ROS was otherwise negative  MEDICAL HISTORY:  Past Medical History:  Diagnosis Date   Anemia    Anxiety    Concussion 09/28/2019   DVT (deep venous thrombosis) ([place]) 2021   L leg   Dyspnea    GERD (gastroesophageal reflux disease)    Hypercholesterolemia    per pt, she does not  have elevated lipids   Hypertension    met lung ca dx'd 09/2019   mets to spine, hip and brain   PONV (postoperative nausea and vomiting)    Tobacco abuse     SURGICAL HISTORY: Past Surgical History:  Procedure Laterality Date   ABDOMINAL HYSTERECTOMY     partial/ left ovaries   APPLICATION OF CRANIAL NAVIGATION N/A 12/02/2020   Procedure: APPLICATION OF CRANIAL NAVIGATION;  Surgeon: Erline Levine, MD;  Location: [facility];  Service: Neurosurgery;  Laterality: N/A;   CHOLECYSTECTOMY     CRANIOTOMY N/A 12/02/2020   Procedure: Posterior fossa craniotomy for tumor resection with brainlab;  Surgeon: Erline Levine, MD;  Location: [place];  Service: Neurosurgery;  Laterality: N/A;   DILATION AND CURETTAGE OF UTERUS     IR IMAGING GUIDED PORT INSERTION  10/23/2019   IR PATIENT EVAL TECH 0-60 MINS  11/21/2021   IR PATIENT EVAL TECH 0-60 MINS  11/24/2021   IR PATIENT EVAL TECH 0-60 MINS  11/29/2021   IR PATIENT EVAL TECH 0-60 MINS  12/04/2021   IR PATIENT EVAL TECH 0-60 MINS  12/12/2021   IR PATIENT EVAL TECH 0-60 MINS  12/19/2021   IR PATIENT EVAL TECH 0-60 MINS  12/27/2021   IR PATIENT EVAL TECH 0-60 MINS  01/03/2022   IR PATIENT EVAL TECH 0-60 MINS  01/10/2022   IR PATIENT EVAL TECH 0-60 MINS  01/18/2022   IR PATIENT EVAL TECH 0-60 MINS  02/13/2022   IR PATIENT EVAL TECH 0-60 MINS  02/20/2022   IR RADIOLOGIST EVAL & MGMT  01/23/2022   IR REMOVAL TUN ACCESS W/ PORT W/O FL MOD SED  11/17/2021   KYPHOPLASTY N/A 03/15/2020   Procedure: Thoracic Eight KYPHOPLASTY;  Surgeon: Erline Levine, MD;  Location: [place];  Service: Neurosurgery;  Laterality: N/A;  prone    LIPOMA EXCISION  2018   removed under left breast and right thigh.   TUBAL LIGATION      ALLERGIES:  has No Known Allergies.  MEDICATIONS:  Current Outpatient Medications  Medication Sig Dispense Refill   acetaminophen (TYLENOL) 500 MG tablet Take 500 mg by mouth 2 (two) times daily as needed for moderate pain.     albuterol (VENTOLIN HFA) 108 (90  Base) MCG/ACT inhaler INHALE 2 PUFFS INTO THE LUNGS EVERY 6 HOURS AS NEEDED FOR WHEEZING OR SHORTNESS OF BREATH 6.7 g 11   ALPRAZolam (XANAX) 0.25 MG tablet Take 1 tablet (0.25 mg total) by mouth 2 (two) times daily as needed for anxiety 60 tablet 1   amLODipine (NORVASC) 10 MG tablet Take 1 tablet (10 mg total) by mouth daily. 90 tablet 3   Cyanocobalamin (VITAMIN B12 PO) Take 1 tablet by mouth daily. Gummies     EPINEPHrine 0.3 mg/0.3 mL IJ SOAJ injection Inject 0.3 mg into the muscle as needed for anaphylaxis. 2 each 0   folic acid (FOLVITE) 1 MG tablet Take 1 tablet by mouth daily. 90 tablet 1   furosemide (LASIX) 20 MG tablet Take 2 tablets (40 mg total) by mouth 2 times daily. 360 tablet 0   Glucosamine HCl (GLUCOSAMINE PO) Take 2 tablets by mouth daily.     lidocaine-prilocaine (EMLA) cream Apply to port as needed. 30 g 0   LYSINE PO Take 1 tablet by mouth daily.     meclizine (ANTIVERT) 25 MG tablet Take 1 tablet (25 mg total) by mouth 3 (three) times daily as needed for dizziness. 30 tablet 0   Multiple Vitamin (MULTIVITAMIN WITH MINERALS) TABS tablet Take 1 tablet by mouth daily.     Multiple Vitamins-Minerals (AIRBORNE PO) Take 1 tablet by mouth daily.      OLANZapine (ZYPREXA) 2.5 MG tablet TAKE 1 TABLET BY MOUTH AT BEDTIME 90 tablet 1   ondansetron (ZOFRAN) 4 MG tablet TAKE 1 TABLET(4 MG) BY MOUTH EVERY 8 HOURS AS NEEDED FOR NAUSEA OR VOMITING 40 tablet 2   OVER THE COUNTER MEDICATION Take 2 tablets by mouth daily. Beet root     oxyCODONE (OXY IR/ROXICODONE) 5 MG immediate release tablet TAKE 1 TO 2 TABLETS BY MOUTH EVERY 4 HOURS AS NEEDED FOR SEVERE PAIN 90 tablet 0   pantoprazole (PROTONIX) 40 MG tablet TAKE 1 TABLET BY MOUTH ONCE DAILY 90 tablet 3   polyethylene glycol (MIRALAX / GLYCOLAX) 17 g packet Take 17 g by mouth every evening.     potassium chloride SA (KLOR-CON M) 20 MEQ tablet Take 2 tablets (40 mEq total) by mouth 2 (two) times daily. 360 tablet 0   Tetrahydrozoline HCl  (VISINE OP) Place 1 drop into both eyes daily as needed (irritation).     VITAMIN D PO Take 1 capsule by mouth daily.     vitamin E 180 MG (400 UNITS) capsule Take 400 IU daily x 1 week,  then 400 IU BID (Patient taking differently: Take 400 Units by mouth daily.) 60 capsule 4   XTAMPZA ER 13.5 MG C12A Take 1 capsule by mouth in the morning and at bedtime. (every 12 hours) 60 capsule 0   No current facility-administered medications for this visit.    REVIEW OF SYSTEMS:   Constitutional: ( - ) fevers, ( - )  chills , ( - ) night sweats Eyes: ( - ) blurriness of vision, ( - ) double vision, ( - ) watery eyes Ears, nose, mouth, throat, and face: ( - ) mucositis, ( - ) sore throat Respiratory: ( - ) cough, ( + ) dyspnea, ( - ) wheezes Cardiovascular: ( - ) palpitation, ( - ) chest discomfort, ( - ) lower extremity swelling Gastrointestinal:  ( + ) nausea, ( - ) heartburn, ( - ) change in bowel habits Skin: ( - ) abnormal skin rashes Lymphatics: ( - ) new lymphadenopathy, ( - ) easy bruising Neurological: ( - ) numbness, ( - ) tingling, ( - ) new weaknesses Behavioral/Psych: ( - ) mood change, ( - ) new changes  All other systems were reviewed with the patient and are negative.  PHYSICAL EXAMINATION: ECOG PERFORMANCE STATUS: 1 - Symptomatic but completely ambulatory  Vitals:   08/13/22 1106  BP: (!) 127/100  Pulse: (!) 104  Resp: 17  Temp: 97.6 F (36.4 C)  SpO2: 95%       Filed Weights   08/13/22 1106  Weight: 141 lb 14.4 oz (64.4 kg)     GENERAL: well appearing middle aged Caucasian female in NAD  SKIN: skin color, texture, turgor are normal, no rashes or significant lesions EYES: conjunctiva are pink and non-injected, sclera clear LUNGS: wheezing at lung bases. clear to auscultation and percussion with normal breathing effort HEART: regular rate & rhythm and no murmurs and bilateral trace pitting lower extremity edema Musculoskeletal: no cyanosis of digits and no  clubbing PSYCH: alert & oriented x 3, fluent speech NEURO: no focal motor/sensory deficits  LABORATORY DATA:  I have reviewed the data as listed    Latest Ref Rng & Units 08/13/2022   10:24 AM 07/19/2022   10:26 AM 07/05/2022   10:46 AM  CBC  WBC 4.0 - 10.5 K/uL 6.2  5.4  5.8   Hemoglobin 12.0 - 15.0 g/dL 14.2  11.9  13.4   Hematocrit 36.0 - 46.0 % 40.3  33.5  37.4   Platelets 150 - 400 K/uL 249  245  392        Latest Ref Rng & Units 08/13/2022   10:24 AM 07/19/2022   10:26 AM 07/05/2022   10:46 AM  CMP  Glucose 70 - 99 mg/dL 102  150  117   BUN 8 - 23 mg/dL _0 Creatinine 0.44 - 1.00 mg/dL 0.69  0.81  0.96   Sodium 135 - 145 mmol/L 138  139  140   Potassium 3.5 - 5.1 mmol/L 3.0  3.6  3.3   Chloride 98 - 111 mmol/L 97  101  101   CO2 22 - 32 mmol/L 32  30  31   Calcium 8.9 - 10.3 mg/dL 9.6  8.7  9.4   Total Protein 6.5 - 8.1 g/dL 7.6  6.9  7.3   Total Bilirubin 0.3 - 1.2 mg/dL 0.5  0.4  0.4   Alkaline Phos 38 - 126 U/L 63  65  68   AST 15 -  41 U/L 19  34  23   ALT 0 - 44 U/L _0 RADIOGRAPHIC STUDIES: I personally have viewed the radiographic studies below: MR BRAIN W WO CONTRAST  Result Date: 08/13/2022 CLINICAL DATA:  Metastatic lung cancer follow-up EXAM: MRI HEAD WITHOUT AND WITH CONTRAST TECHNIQUE: Multiplanar, multiecho pulse sequences of the brain and surrounding structures were obtained without and with intravenous contrast. CONTRAST:  6.5 cc of vueway intravenous COMPARISON:  04/26/2022 FINDINGS: Brain: Previously extensive patchy enhancement in the posterior fossa has subsided. 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 No brain swelling/edema progression. Lesion enhancement in the low right parietal lobe is unchanged at 6 mm on 13:94. The lesion in the posterior left frontal lobe has subsided, essentially not seen on black blood sequence. Lesions in the right occipital lobe on 13:60 and 79 are regressed. No new lesion is seen. Subdural  collections are newly seen along the bilateral cerebral convexity, measuring up to 3 mm without cerebral mass effect. There is generalized dural thickening and hyperenhancement which is considered reactive. No brain sagging or nodular dural thickening. Vascular: Major flow voids and vascular enhancements are preserved Skull and upper cervical spine: Unremarkable left suboccipital craniotomy site. No worrisome bone lesion. Enhancement at the high left parietal vertex is attributed to arachnoid granulation. Sinuses/Orbits: Negative IMPRESSION: 1. New small subdural collections along the bilateral cerebral convexity with reactive appearing dural thickening. Please correlate for recent trauma. 2. Cerebral and cerebellar lesions show generalized regression of enhancement, positive Avastin treatment response. No new lesion. Electronically Signed   By: Jorje Guild M.D.   On: 08/13/2022 07:57     ASSESSMENT & PLAN Virginia [name] 66 y.o. female with medical history significant for metastatic adenocarcinoma of the lung presents for a follow up visit. 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   Previously we discussed Carboplatin/Pembrolizumab/Pemetrexed and the expected side effect from these medications.  We discussed the possible side effects of immunotherapy including colitis, hepatitis, dermatitis, and pneumonitis.  Additionally we discussed the side effects of carboplatin which include suppression of blood counts, nephrotoxicity, and nausea.  Additionally we discussed pemetrexed which can also have effects on counts and would require supplementation with vitamin X83 and folic acid.  She voiced her understanding of these side effects and treatment plans moving forward.  I also noted that we would be able to drop the chemotherapy agents that she had intolerance to continue with pembrolizumab alone given her high TPS score.  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 Due to worsening dizziness, she resumed this from Cycle 25-27.  --repeat CT CAP on 12/11/2021 showed stable disease with no evidence of progression. Next due in July 2023.  PLAN: --Labs from today were reviewed and adequate for treatment.  Labs today show creatinine 0.69, white blood cell 6.2, hemoglobin 14.2, MCV 95.5, and platelets of 249 --Continue with Cycle 40, Day 1 of maintenance Pem/Pem. --RTC q 3 weeks for labs, follow up visit with Dr. Lorenso Courier prior to Cycle 41.    #Nausea/Vomiting, improving -- provided patient with Zofran 4-29m q8h PRN and compazine 113mq6H for breakthrough PRN.  She is currently using Phenergan for breakthrough. --continue olanzpaine 2.5 mg PO QHS to help with nausea.  Marked improvement since the addition of this medication on 01/29/2020.  --continue to monitor   #Hypokalemia, stable --likely 2/2 to decreased PO intake, hypovolemia, and BP medications --Potassium level 3.0 today.  Patient stopped her p.o. potassium supplementation while on vacation.  Recommend that she continue oral potassium chloride with treatment --Currently on potassium chloride tablets, 40 mEq PO potassium in AM and 53mq in PM. Due to difficulty swallowing tablets, we will try granules. Sent new prescription today.   #Pain Control, stable --continue oxycodone 551mq4H PRN. Can increase dose to 1025m6H if pain is severe. She is taking approximately 2-3 pills per day.  --continue Xtampza 13.5 mg ER BID for long acting support.  --continue to take with Senokot to prevent opioid induced constipation. 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 Last 01/18/2022, next dose currently due.  --nausea  as above    #Brain Metastasis, stable --appreciate the assistance of Dr. VasMickeal Skinner treating her brain metastasis and symptoms. --radiation therapy to the brain completed on 11/16/2019.  --MRI brain on 04/29/2020 showed evidence of small brain bleed. Eliquis therapy was discontinued.  --MRI on 11/04/2020 showed concern for progression of intracranial disease.  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  --MRI on 12/23/2020 showed evidence of radiation necrosis. Dr. VasMickeal Skinneraluated the patient on 01/02/2021 with recommendations to add IV Bevacizumab 10 mg/kg q 3 weeks ( started 01/26/2021). Completed a full course of this therapy. --Patient was evaluated by Dr. VasMickeal Skinner 08/29/2021 and recommended to resume Bevacizumab due to worsening dizziness.  Which was given from Cycle 25-27.  Plan: --continue to follow with Dr. VasMickeal Skinner-Avastin added back in the patient's chemotherapy regimen. Will restart in 4 weeks from today (out from dental procedure).  --brain MRI performed on 08/10/2022, no evidence of progressive disease..  Marland Kitchen#VTE --patient had left lower extremity VTE diagnosed 11/09/2019 --stopped Apixaban on 04/29/2020 after MRI showed brain bleed --continue to monitor, no signs of recurrent VTE today.    # Hypertension: --Continue Norvasc to 10 mg daily.  --continue to monitor at home and in clinic.    No orders of the defined types were placed in this encounter.   All questions were answered. The patient knows to call the clinic with any problems, questions or concerns.  I have spent a total of 30 minutes minutes of face-to-face and non-face-to-face time, preparing to see the patient, performing a medically appropriate examination, counseling and educating the patient,  documenting clinical information in the electronic health record, and care coordination.   JohLedell PeoplesD Department of  Hematology/Oncology ConS[facility] Wes[facility] Centerone: [phone]: [phone]: [email or web address]   08/13/2022 4:32 PM   Literature Support:  GanLorenza Chickodrguez-Abreu D, GadDuffy Rhodyelip E, De Angelis F, Domine M, CliStanfordochmair MJ, PowJusticeheBartlettischoff HG, PelPinewoodrossi F, JenDuryeaeck M, HuiAlbrightarOak RunoyMarkhamubio-Viqueira B, Novello S, Kurata T, Gray JE, Vida J, Wei Z, Yang J, Raftopoulos H, PieCulparGreenevers;Weyerhaeuser CompanyEYTLXBWIO-035vestigators. Pembrolizumab plus Chemotherapy in Metastatic Non-Small-Cell Lung Cancer. N EAlta Corningd. 2018 May 31;378(22):2078-2092.  --Median progression-free survival was 8.8 months (95% CI, 7.6 to 9.2) in the pembrolizumab-combination group and 4.9 months (95% CI, 4.7 to 5.5) in the placebo-combination group (hazard  ratio for disease progression or death, 0.52; 95% CI, 0.43 to 0.64; P<0.001). Adverse events of grade 3 or higher occurred in 67.2% of the patients in the pembrolizumab-combination group and in 65.8% of those in the placebo-combination group.  Jodene Nam L. Efficacy of pemetrexed-based regimens in advanced non-small cell lung cancer patients with activating epidermal growth factor receptor mutations after tyrosine kinase inhibitor failure: a systematic review. Onco Targets Ther. 2018;11:2121-2129.   --The weighted median PFS, median OS, and ORR for patients treated with pem regimens were 5.09 months, 15.91 months, and 30.19%, respectively. Our systematic review results showed a favorable efficacy profile of pem regimens in NSCLC patients with EGFR mutation after EGFR-TKI failure.

## 2022-08-14 ENCOUNTER — Telehealth: Payer: Self-pay | Admitting: Internal Medicine

## 2022-08-14 NOTE — Telephone Encounter (Signed)
Per 12/4 los called and spoke to pt spouse

## 2022-08-15 ENCOUNTER — Other Ambulatory Visit: Payer: BC Managed Care – PPO

## 2022-08-15 ENCOUNTER — Ambulatory Visit: Payer: BC Managed Care – PPO | Admitting: Physician Assistant

## 2022-08-15 ENCOUNTER — Ambulatory Visit: Payer: BC Managed Care – PPO

## 2022-08-16 ENCOUNTER — Ambulatory Visit: Payer: BC Managed Care – PPO | Admitting: Internal Medicine

## 2022-08-17 ENCOUNTER — Ambulatory Visit: Payer: Medicare Other

## 2022-08-17 ENCOUNTER — Ambulatory Visit: Payer: Medicare Other | Admitting: Physician Assistant

## 2022-08-17 ENCOUNTER — Other Ambulatory Visit: Payer: Medicare Other

## 2022-08-22 ENCOUNTER — Other Ambulatory Visit (HOSPITAL_COMMUNITY): Payer: Self-pay

## 2022-08-22 ENCOUNTER — Other Ambulatory Visit: Payer: Self-pay | Admitting: *Deleted

## 2022-08-22 ENCOUNTER — Other Ambulatory Visit: Payer: Self-pay | Admitting: Hematology and Oncology

## 2022-08-22 MED ORDER — XTAMPZA ER 13.5 MG PO C12A: 1.0000 | EXTENDED_RELEASE_CAPSULE | Freq: Two times a day (BID) | ORAL | 0 refills | Status: DC

## 2022-08-22 MED ORDER — FUROSEMIDE 20 MG PO TABS: 40.0000 mg | ORAL_TABLET | Freq: Two times a day (BID) | ORAL | 0 refills | Status: DC

## 2022-08-22 MED ORDER — FOLIC ACID 1 MG PO TABS: ORAL_TABLET | Freq: Every day | ORAL | 1 refills | Status: DC

## 2022-08-22 NOTE — Telephone Encounter (Signed)
Pt needs refill of her Extampza ASAP @ [facility] on [address]

## 2022-08-27 ENCOUNTER — Other Ambulatory Visit (HOSPITAL_COMMUNITY): Payer: Self-pay

## 2022-08-29 ENCOUNTER — Other Ambulatory Visit: Payer: Self-pay | Admitting: Radiation Therapy

## 2022-09-05 ENCOUNTER — Other Ambulatory Visit: Payer: Self-pay

## 2022-09-05 MED ORDER — PANTOPRAZOLE SODIUM 40 MG PO TBEC: 40.0000 mg | DELAYED_RELEASE_TABLET | Freq: Every day | ORAL | 3 refills | Status: DC

## 2022-09-06 ENCOUNTER — Inpatient Hospital Stay (HOSPITAL_BASED_OUTPATIENT_CLINIC_OR_DEPARTMENT_OTHER): Payer: Medicare Other | Admitting: Hematology and Oncology

## 2022-09-06 ENCOUNTER — Inpatient Hospital Stay: Payer: Medicare Other

## 2022-09-06 ENCOUNTER — Inpatient Hospital Stay: Payer: Medicare Other | Admitting: Hematology and Oncology

## 2022-09-06 VITALS — BP 138/102 | HR 96 | Temp 97.6°F | Resp 16 | Wt 140.7 lb

## 2022-09-06 VITALS — BP 117/71 | HR 92 | Resp 16

## 2022-09-06 DIAGNOSIS — Z9049 Acquired absence of other specified parts of digestive tract: Secondary | ICD-10-CM | POA: Diagnosis not present

## 2022-09-06 DIAGNOSIS — R42 Dizziness and giddiness: Secondary | ICD-10-CM | POA: Diagnosis not present

## 2022-09-06 DIAGNOSIS — Z8673 Personal history of transient ischemic attack (TIA), and cerebral infarction without residual deficits: Secondary | ICD-10-CM | POA: Diagnosis not present

## 2022-09-06 DIAGNOSIS — R918 Other nonspecific abnormal finding of lung field: Secondary | ICD-10-CM

## 2022-09-06 DIAGNOSIS — Z86718 Personal history of other venous thrombosis and embolism: Secondary | ICD-10-CM | POA: Diagnosis not present

## 2022-09-06 DIAGNOSIS — C7931 Secondary malignant neoplasm of brain: Secondary | ICD-10-CM

## 2022-09-06 DIAGNOSIS — Z5112 Encounter for antineoplastic immunotherapy: Secondary | ICD-10-CM | POA: Diagnosis not present

## 2022-09-06 DIAGNOSIS — Z814 Family history of other substance abuse and dependence: Secondary | ICD-10-CM | POA: Diagnosis not present

## 2022-09-06 DIAGNOSIS — Z5111 Encounter for antineoplastic chemotherapy: Secondary | ICD-10-CM | POA: Diagnosis not present

## 2022-09-06 DIAGNOSIS — E876 Hypokalemia: Secondary | ICD-10-CM | POA: Diagnosis not present

## 2022-09-06 DIAGNOSIS — Z8249 Family history of ischemic heart disease and other diseases of the circulatory system: Secondary | ICD-10-CM | POA: Diagnosis not present

## 2022-09-06 DIAGNOSIS — Z79899 Other long term (current) drug therapy: Secondary | ICD-10-CM | POA: Diagnosis not present

## 2022-09-06 DIAGNOSIS — Z87891 Personal history of nicotine dependence: Secondary | ICD-10-CM | POA: Diagnosis not present

## 2022-09-06 DIAGNOSIS — Z923 Personal history of irradiation: Secondary | ICD-10-CM | POA: Diagnosis not present

## 2022-09-06 DIAGNOSIS — C3431 Malignant neoplasm of lower lobe, right bronchus or lung: Secondary | ICD-10-CM | POA: Diagnosis not present

## 2022-09-06 DIAGNOSIS — Z7901 Long term (current) use of anticoagulants: Secondary | ICD-10-CM | POA: Diagnosis not present

## 2022-09-06 DIAGNOSIS — I6789 Other cerebrovascular disease: Secondary | ICD-10-CM | POA: Diagnosis not present

## 2022-09-06 LAB — CBC WITH DIFFERENTIAL (CANCER CENTER ONLY)
Abs Immature Granulocytes: 0.01 10*3/uL (ref 0.00–0.07)
Basophils Absolute: 0 10*3/uL (ref 0.0–0.1)
Basophils Relative: 1 %
Eosinophils Absolute: 0.1 10*3/uL (ref 0.0–0.5)
Eosinophils Relative: 2 %
HCT: 38.5 % (ref 36.0–46.0)
Hemoglobin: 13.4 g/dL (ref 12.0–15.0)
Immature Granulocytes: 0 %
Lymphocytes Relative: 36 %
Lymphs Abs: 2.3 10*3/uL (ref 0.7–4.0)
MCH: 33.8 pg (ref 26.0–34.0)
MCHC: 34.8 g/dL (ref 30.0–36.0)
MCV: 97 fL (ref 80.0–100.0)
Monocytes Absolute: 0.9 10*3/uL (ref 0.1–1.0)
Monocytes Relative: 15 %
Neutro Abs: 2.9 10*3/uL (ref 1.7–7.7)
Neutrophils Relative %: 46 %
Platelet Count: 300 10*3/uL (ref 150–400)
RBC: 3.97 MIL/uL (ref 3.87–5.11)
RDW: 13.2 % (ref 11.5–15.5)
WBC Count: 6.3 10*3/uL (ref 4.0–10.5)
nRBC: 0 % (ref 0.0–0.2)

## 2022-09-06 LAB — CMP (CANCER CENTER ONLY)
ALT: 12 U/L (ref 0–44)
AST: 18 U/L (ref 15–41)
Albumin: 3.4 g/dL — ABNORMAL LOW (ref 3.5–5.0)
Alkaline Phosphatase: 64 U/L (ref 38–126)
Anion gap: 7 (ref 5–15)
BUN: 13 mg/dL (ref 8–23)
CO2: 28 mmol/L (ref 22–32)
Calcium: 9.7 mg/dL (ref 8.9–10.3)
Chloride: 104 mmol/L (ref 98–111)
Creatinine: 0.63 mg/dL (ref 0.44–1.00)
GFR, Estimated: >60 mL/min (ref >60)
Glucose, Bld: 108 mg/dL — ABNORMAL HIGH (ref 70–99)
Potassium: 3.7 mmol/L (ref 3.5–5.1)
Sodium: 139 mmol/L (ref 135–145)
Total Bilirubin: 0.5 mg/dL (ref 0.3–1.2)
Total Protein: 7.2 g/dL (ref 6.5–8.1)

## 2022-09-06 LAB — TSH: TSH: 2.658 u[IU]/mL (ref 0.350–4.500)

## 2022-09-06 MED ORDER — SODIUM CHLORIDE 0.9 % IV SOLN
200.0000 mg | Freq: Once | INTRAVENOUS | Status: AC
  Administered 2022-09-06: 200 mg via INTRAVENOUS
  Filled 2022-09-06: qty 200

## 2022-09-06 MED ORDER — SODIUM CHLORIDE 0.9 % IV SOLN: Freq: Once | INTRAVENOUS | Status: AC

## 2022-09-06 MED ORDER — PROCHLORPERAZINE MALEATE 10 MG PO TABS
10.0000 mg | ORAL_TABLET | Freq: Once | ORAL | Status: AC
  Administered 2022-09-06: 10 mg via ORAL
  Filled 2022-09-06: qty 1

## 2022-09-06 MED ORDER — SODIUM CHLORIDE 0.9 % IV SOLN
500.0000 mg/m2 | Freq: Once | INTRAVENOUS | Status: AC
  Administered 2022-09-06: 900 mg via INTRAVENOUS
  Filled 2022-09-06: qty 20

## 2022-09-06 MED ORDER — CYANOCOBALAMIN 1000 MCG/ML IJ SOLN
1000.0000 ug | Freq: Once | INTRAMUSCULAR | Status: AC
  Administered 2022-09-06: 1000 ug via INTRAMUSCULAR
  Filled 2022-09-06: qty 1

## 2022-09-06 MED ORDER — ALPRAZOLAM 0.25 MG PO TABS: 0.2500 mg | ORAL_TABLET | Freq: Two times a day (BID) | ORAL | 0 refills | Status: DC | PRN

## 2022-09-06 NOTE — Progress Notes (Signed)
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 CT of the head showed showed concerning for metastatic disease involving the left cerebellum and right occipital lobe.  2) 09/30/2019: MRI brain confirms mass within the left cerebellum measures 1.6 x 1.3 x 1.5 cm as well as at least 8 metastatic lesions within the supratentorial and infratentorial brain as outlined 3) 10/01/2019: PET CT scan revealed hypermetabolic infrahilar right lower lobe mass with hypermetabolic nodal metastases in the right hilum, mediastinum, right supraclavicular region, left axilla and lower left neck. 4) 10/08/2019: US guided biopsy of the lymph nodes confirms poorly differentiated non-small cell carcinoma. Immunohistochemistry confirms adenocarcinoma of lung primary. PD-L1 and NGS later revealed a MET Exon 14 mutation and TPS of 90%.  5) 10/12/2019: Establish care with Dr. Lorenso Courier  6) 11/04/2019: Day 1 of capmatinib 427m BID 7) 11/09/2019: presented to Rad/Onc with a DVT in LLE. Seen in symptom management clinic and started on apixaban.  8) 12/29/2019: CT C/A/P showed interval decrease in size of mass involving the superior segment of right lower lobe and reduction in mediastinal and left supraclavicular adenopathy though some small spinal lesions were noted in the interim between the two sets of scans 9) 01/25/2020: temporarily stopped capmatinib due to symptoms of nausea/fatigue. Restarted therapy on 01/30/2020 after brief chemo holiday.  10)  03/22/2020: CT C/A/P reveals a mixed picture, with new lung nodule and increased lymphadenopathy, however response in bone lesions. D/c capmatinib therapy 11) 04/08/2020: start of Carbo/Pem/Pem for 2nd line treatment. Cycle 1 Day 1   12) 04/29/2020: Cycle 2 Day 1 Carbo/Pem/Pem  13) 05/20/2020: Cycle 3 Day 1 Carbo/Pem/Pem  14) 06/09/2020: Cycle 4 Day 1 Carbo/Pem/Pem  15) 06/29/2020: Cycle 5 Day 1 maintenance Pem/Pem  16) 07/20/2020: Cycle 6 Day 1 maintenance Pem/Pem  17) 08/12/2020: Cycle 7 Day 1 maintenance Pem/Pem  18) 09/01/2020: Cycle 8 Day 1 maintenance Pem/Pem  19) 09/21/2020: Cycle 9 Day 1 maintenance Pem/Pem  20) 10/13/2020: Cycle 10 Day 1 maintenance Pem/Pem  21) 11/03/2020: Cycle 11 Day 1 maintenance Pem/Pem  22) 11/25/2020:  Cycle 12 Day 1 maintenance Pem/Pem 23) 12/15/2020:  Cycle 13 Day 1 maintenance Pem/Pem 24) 01/05/2021:  Cycle 14 Day 1 maintenance Pem/Pem 25) 01/26/2021:  Cycle 15 Day 1 maintenance Pem/Pem plus Bevacizumab.  02/17/2021: Cycle 16 Day 1 maintenance Pem/Pem plus Bevacizumab.  03/10/2021: Cycle 17 Day 1 maintenance Pem/Pem plus Bevacizumab.  03/31/2021: Cycle 18 Day 1 maintenance Pem/Pem  04/20/2021:  Cycle 19 Day 1 maintenance Pem/Pem  05/12/2021: Cycle 20 Day 1 maintenance Pem/Pem 06/01/2021: Cycle 21 Day 1 maintenance Pem/Pem 06/22/2021: Cycle 22 Day 1 maintenance Pem/Pem 07/13/2021: Cycle 23 Day 1 maintenance Pem/Pem 08/16/2021: Cycle 24 Day 1 maintenance Pem/Pem 09/08/2021: Cycle 25 Day 1 maintenance Pem/Pem plus Bevacizumab 09/28/2021: Cycle 26 Day 1 maintenance Pem/Pem plus Bevacizumab 10/20/2021: Cycle 27 Day 1 maintenance Pem/Pem plus Bevacizumab 11/09/2021: Cycle 28 Day 1 maintenance Pem/Pem  12/06/2021: Cycle 29 Day 1 maintenance Pem/Pem  12/27/2021: Cycle 30 Day 1 maintenance Pem/Pem  01/18/2022: Cycle 31 Day 1 maintenance Pem/Pem  02/07/2022: Cycle 32 Day  1 maintenance Pem/Pem  02/28/2022: Cycle 33 Day 1 maintenance Pem/Pem  03/23/2022: Cycle 34 Day 1 maintenance Pem/Pem   04/13/2022: Cycle 35 Day 1 maintenance Pem/Pem  05/04/2022: Cycle 36 Day 1 maintenance Pem/Pem  05/25/2022: Cycle 37 Day 1 maintenance Pem/Pem  06/14/2022: Cycle 38 Day 1 maintenance Pem/Pem 07/05/2022: Cycle 39 Day 1 maintenance Pem/Pem 08/13/2022: Cycle 40 Day 1 maintenance Pem/Pem  Interval History:  Virginia [name] 66 y.o. female with medical history significant for metastatic adenocarcinoma of the lung presents for a follow up visit. The patient's last visit was on 08/13/2022.  In the interim since the last visit she has continued on maintenance Pem/Pem.   On exam today Virginia [name] reports she and her husband had a good Christmas.  She reports that her primary symptom continues to be dizziness.  She reports that she has "not been feeling quite right".  She has had a low-grade fever on occasion and has a feeling of being cold.  She notes that she is off her Avastin and doing her best to try to keep up with her potassium levels.  She notes her blood pressure is little bit elevated today as she forgot to take her blood pressure medications this morning.  She just took them earlier in her clinic visit.  She reports that she is not having any other major side effects as result of her chemotherapy treatment.  She has not noticed anything out of the ordinary though her husband reports she is having more frequent nightmares.  She is asking for refill on her Xanax today.  She notes that she is otherwise tolerating treatment quite well and is willing and able to proceed at this time.  She denies any fevers, chills, sweats, vomiting, diarrhea.  Full 10 point ROS was otherwise negative  MEDICAL HISTORY:  Past Medical History:  Diagnosis Date   Anemia    Anxiety    Concussion 09/28/2019   DVT (deep venous thrombosis) ([place]) 2021   L leg   Dyspnea    GERD (gastroesophageal reflux disease)    Hypercholesterolemia    per pt, she does not have elevated lipids   Hypertension    met lung ca dx'd 09/2019    mets to spine, hip and brain   PONV (postoperative nausea and vomiting)    Tobacco abuse     SURGICAL HISTORY: Past Surgical History:  Procedure Laterality Date   ABDOMINAL HYSTERECTOMY     partial/ left ovaries   APPLICATION OF CRANIAL NAVIGATION N/A 12/02/2020   Procedure: APPLICATION OF CRANIAL NAVIGATION;  Surgeon: Erline Levine, MD;  Location: [place];  Service: Neurosurgery;  Laterality: N/A;   CHOLECYSTECTOMY     CRANIOTOMY N/A 12/02/2020   Procedure: Posterior fossa craniotomy for tumor resection with brainlab;  Surgeon: Erline Levine, MD;  Location: [place];  Service: Neurosurgery;  Laterality: N/A;   DILATION AND CURETTAGE OF UTERUS     IR IMAGING GUIDED PORT INSERTION  10/23/2019   IR PATIENT EVAL TECH 0-60 MINS  11/21/2021   IR PATIENT EVAL TECH 0-60 MINS  11/24/2021   IR PATIENT EVAL TECH 0-60 MINS  11/29/2021   IR PATIENT EVAL TECH 0-60 MINS  12/04/2021   IR PATIENT EVAL TECH 0-60 MINS  12/12/2021   IR PATIENT EVAL TECH 0-60 MINS  12/19/2021   IR PATIENT EVAL TECH 0-60 MINS  12/27/2021   IR PATIENT EVAL TECH 0-60 MINS  01/03/2022   IR PATIENT EVAL TECH 0-60 MINS  01/10/2022   IR  PATIENT EVAL TECH 0-60 MINS  01/18/2022   IR PATIENT EVAL TECH 0-60 MINS  02/13/2022   IR PATIENT EVAL TECH 0-60 MINS  02/20/2022   IR RADIOLOGIST EVAL & MGMT  01/23/2022   IR REMOVAL TUN ACCESS W/ PORT W/O FL MOD SED  11/17/2021   KYPHOPLASTY N/A 03/15/2020   Procedure: Thoracic Eight KYPHOPLASTY;  Surgeon: Erline Levine, MD;  Location: [place];  Service: Neurosurgery;  Laterality: N/A;  prone    LIPOMA EXCISION  2018   removed under left breast and right thigh.   TUBAL LIGATION      ALLERGIES:  has No Known Allergies.  MEDICATIONS:  Current Outpatient Medications  Medication Sig Dispense Refill   acetaminophen (TYLENOL) 500 MG tablet Take 500 mg by mouth 2 (two) times daily as needed for moderate pain.     albuterol (VENTOLIN HFA) 108 (90 Base) MCG/ACT inhaler INHALE 2 PUFFS INTO THE LUNGS EVERY 6 HOURS AS  NEEDED FOR WHEEZING OR SHORTNESS OF BREATH 6.7 g 11   ALPRAZolam (XANAX) 0.25 MG tablet Take 1 tablet (0.25 mg total) by mouth 2 (two) times daily as needed for anxiety 60 tablet 0   amLODipine (NORVASC) 10 MG tablet Take 1 tablet (10 mg total) by mouth daily. 90 tablet 3   Cyanocobalamin (VITAMIN B12 PO) Take 1 tablet by mouth daily. Gummies     EPINEPHrine 0.3 mg/0.3 mL IJ SOAJ injection Inject 0.3 mg into the muscle as needed for anaphylaxis. 2 each 0   folic acid (FOLVITE) 1 MG tablet Take 1 tablet by mouth daily. 90 tablet 1   furosemide (LASIX) 20 MG tablet Take 2 tablets (40 mg total) by mouth 2 times daily. 360 tablet 0   Glucosamine HCl (GLUCOSAMINE PO) Take 2 tablets by mouth daily.     lidocaine-prilocaine (EMLA) cream Apply to port as needed. 30 g 0   LYSINE PO Take 1 tablet by mouth daily.     meclizine (ANTIVERT) 25 MG tablet Take 1 tablet (25 mg total) by mouth 3 (three) times daily as needed for dizziness. 30 tablet 0   Multiple Vitamin (MULTIVITAMIN WITH MINERALS) TABS tablet Take 1 tablet by mouth daily.     Multiple Vitamins-Minerals (AIRBORNE PO) Take 1 tablet by mouth daily.      OLANZapine (ZYPREXA) 2.5 MG tablet TAKE 1 TABLET BY MOUTH AT BEDTIME 90 tablet 1   ondansetron (ZOFRAN) 4 MG tablet TAKE 1 TABLET(4 MG) BY MOUTH EVERY 8 HOURS AS NEEDED FOR NAUSEA OR VOMITING 40 tablet 2   OVER THE COUNTER MEDICATION Take 2 tablets by mouth daily. Beet root     oxyCODONE (OXY IR/ROXICODONE) 5 MG immediate release tablet TAKE 1 TO 2 TABLETS BY MOUTH EVERY 4 HOURS AS NEEDED FOR SEVERE PAIN 90 tablet 0   pantoprazole (PROTONIX) 40 MG tablet Take 1 tablet (40 mg total) by mouth daily. 90 tablet 3   polyethylene glycol (MIRALAX / GLYCOLAX) 17 g packet Take 17 g by mouth every evening.     potassium chloride SA (KLOR-CON M) 20 MEQ tablet Take 2 tablets (40 mEq total) by mouth 2 (two) times daily. 360 tablet 0   Tetrahydrozoline HCl (VISINE OP) Place 1 drop into both eyes daily as needed  (irritation).     VITAMIN D PO Take 1 capsule by mouth daily.     vitamin E 180 MG (400 UNITS) capsule Take 400 IU daily x 1 week, then 400 IU BID (Patient taking differently: Take 400 Units by mouth  daily.) 60 capsule 4   XTAMPZA ER 13.5 MG C12A Take 1 capsule by mouth in the morning and at bedtime. (every 12 hours) 60 capsule 0   No current facility-administered medications for this visit.   Facility-Administered Medications Ordered in Other Visits  Medication Dose Route Frequency Provider Last Rate Last Admin   pembrolizumab (KEYTRUDA) 200 mg in sodium chloride 0.9 % 50 mL chemo infusion  200 mg Intravenous Once Ledell Peoples IV, MD 116 mL/hr at 09/06/22 1312 200 mg at 09/06/22 1312   PEMEtrexed (ALIMTA) 900 mg in sodium chloride 0.9 % 100 mL chemo infusion  500 mg/m2 (Treatment Plan Recorded) Intravenous Once Orson Slick, MD        REVIEW OF SYSTEMS:   Constitutional: ( - ) fevers, ( - )  chills , ( - ) night sweats Eyes: ( - ) blurriness of vision, ( - ) double vision, ( - ) watery eyes Ears, nose, mouth, throat, and face: ( - ) mucositis, ( - ) sore throat Respiratory: ( - ) cough, ( + ) dyspnea, ( - ) wheezes Cardiovascular: ( - ) palpitation, ( - ) chest discomfort, ( - ) lower extremity swelling Gastrointestinal:  ( + ) nausea, ( - ) heartburn, ( - ) change in bowel habits Skin: ( - ) abnormal skin rashes Lymphatics: ( - ) new lymphadenopathy, ( - ) easy bruising Neurological: ( - ) numbness, ( - ) tingling, ( - ) new weaknesses Behavioral/Psych: ( - ) mood change, ( - ) new changes  All other systems were reviewed with the patient and are negative.  PHYSICAL EXAMINATION: ECOG PERFORMANCE STATUS: 1 - Symptomatic but completely ambulatory  Vitals:   09/06/22 1113  BP: (!) 138/102  Pulse: 96  Resp: 16  Temp: 97.6 F (36.4 C)  SpO2: 98%       Filed Weights   09/06/22 1113  Weight: 140 lb 11.2 oz (63.8 kg)     GENERAL: well appearing middle aged Caucasian  female in NAD  SKIN: skin color, texture, turgor are normal, no rashes or significant lesions EYES: conjunctiva are pink and non-injected, sclera clear LUNGS: wheezing at lung bases. clear to auscultation and percussion with normal breathing effort HEART: regular rate & rhythm and no murmurs and bilateral trace pitting lower extremity edema Musculoskeletal: no cyanosis of digits and no clubbing PSYCH: alert & oriented x 3, fluent speech NEURO: no focal motor/sensory deficits  LABORATORY DATA:  I have reviewed the data as listed    Latest Ref Rng & Units 09/06/2022   10:50 AM 08/13/2022   10:24 AM 07/19/2022   10:26 AM  CBC  WBC 4.0 - 10.5 K/uL 6.3  6.2  5.4   Hemoglobin 12.0 - 15.0 g/dL 13.4  14.2  11.9   Hematocrit 36.0 - 46.0 % 38.5  40.3  33.5   Platelets 150 - 400 K/uL 300  249  245        Latest Ref Rng & Units 09/06/2022   10:50 AM 08/13/2022   10:24 AM 07/19/2022   10:26 AM  CMP  Glucose 70 - 99 mg/dL 108  102  150   BUN 8 - 23 mg/dL _0 Creatinine 0.44 - 1.00 mg/dL 0.63  0.69  0.81   Sodium 135 - 145 mmol/L 139  138  139   Potassium 3.5 - 5.1 mmol/L 3.7  3.0  3.6   Chloride 98 - 111 mmol/L 104  97  101   CO2 22 - 32 mmol/L 28  32  30   Calcium 8.9 - 10.3 mg/dL 9.7  9.6  8.7   Total Protein 6.5 - 8.1 g/dL 7.2  7.6  6.9   Total Bilirubin 0.3 - 1.2 mg/dL 0.5  0.5  0.4   Alkaline Phos 38 - 126 U/L 64  63  65   AST 15 - 41 U/L 18  19  34   ALT 0 - 44 U/L _0 RADIOGRAPHIC STUDIES: I personally have viewed the radiographic studies below: MR BRAIN W WO CONTRAST  Result Date: 08/13/2022 CLINICAL DATA:  Metastatic lung cancer follow-up EXAM: MRI HEAD WITHOUT AND WITH CONTRAST TECHNIQUE: Multiplanar, multiecho pulse sequences of the brain and surrounding structures were obtained without and with intravenous contrast. CONTRAST:  6.5 cc of vueway intravenous COMPARISON:  04/26/2022 FINDINGS: Brain: Previously extensive patchy enhancement in the posterior  fossa has subsided. 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 No brain swelling/edema progression. Lesion enhancement in the low right parietal lobe is unchanged at 6 mm on 13:94. The lesion in the posterior left frontal lobe has subsided, essentially not seen on black blood sequence. Lesions in the right occipital lobe on 13:60 and 79 are regressed. No new lesion is seen. Subdural collections are newly seen along the bilateral cerebral convexity, measuring up to 3 mm without cerebral mass effect. There is generalized dural thickening and hyperenhancement which is considered reactive. No brain sagging or nodular dural thickening. Vascular: Major flow voids and vascular enhancements are preserved Skull and upper cervical spine: Unremarkable left suboccipital craniotomy site. No worrisome bone lesion. Enhancement at the high left parietal vertex is attributed to arachnoid granulation. Sinuses/Orbits: Negative IMPRESSION: 1. New small subdural collections along the bilateral cerebral convexity with reactive appearing dural thickening. Please correlate for recent trauma. 2. Cerebral and cerebellar lesions show generalized regression of enhancement, positive Avastin treatment response. No new lesion. Electronically Signed   By: Jorje Guild M.D.   On: 08/13/2022 07:57     ASSESSMENT & PLAN LATEESHA BEZOLD 66 y.o. female with medical history significant for metastatic adenocarcinoma of the lung presents for a follow up visit. 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   Previously we discussed Carboplatin/Pembrolizumab/Pemetrexed and the expected side effect from these medications.  We discussed the possible side effects of immunotherapy including colitis, hepatitis, dermatitis, and pneumonitis.  Additionally we discussed the side effects of carboplatin which include suppression of blood counts, nephrotoxicity, and nausea.  Additionally we discussed pemetrexed which can  also have effects on counts and would require supplementation with vitamin J69 and folic acid.  She voiced her understanding of these side effects and treatment plans moving forward.  I also noted that we would be able to drop the chemotherapy agents that she had intolerance to continue with pembrolizumab alone given her high TPS score.  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 Due to worsening dizziness, she resumed this from Cycle 25-27.  --repeat CT CAP on 12/11/2021 showed stable disease with no evidence of progression. Next due in July 2023.  PLAN: --Labs from today were reviewed and adequate for treatment.  Labs today show creatinine 0.63, white blood cell 6.3,  hemoglobin 13.4, MCV 97, and platelets of 300. --Continue with Cycle 41, Day 1 of maintenance Pem/Pem. --RTC q 3 weeks for labs, follow up visit prior to Cycle 42.    #Nausea/Vomiting, improving -- provided patient with Zofran 4-63m q8h PRN and compazine 137mq6H for breakthrough PRN.  She is currently using Phenergan for breakthrough. --continue olanzpaine 2.5 mg PO QHS to help with nausea. Marked improvement since the addition of this medication on 01/29/2020.  --continue to monitor   #Hypokalemia, stable --likely 2/2 to decreased PO intake, hypovolemia, and BP medications --Potassium level 3.0 today.  Patient stopped her p.o. potassium supplementation while on vacation.  Recommend that she continue oral potassium chloride with treatment --Currently on potassium chloride tablets, 40 mEq PO potassium in AM and 203min PM.   #Pain Control, stable --continue oxycodone 5mg88mH PRN. Can increase dose to 10mg31m if pain is severe. She is taking approximately 2-3 pills per day.  --continue Xtampza 13.5 mg ER BID for long  acting support.  --continue to take with Senokot to prevent opioid induced constipation. 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 Last 01/18/2022, next dose currently due.  --nausea as above    #Brain Metastasis, stable --appreciate the assistance of Dr. VasloMickeal Skinnerreating her brain metastasis and symptoms. --radiation therapy to the brain completed on 11/16/2019.  --MRI brain on 04/29/2020 showed evidence of small brain bleed. Eliquis therapy was discontinued.  --MRI on 11/04/2020 showed concern for progression of intracranial disease.  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  --MRI on 12/23/2020 showed evidence of radiation necrosis. Dr. VasloMickeal Skinneruated the patient on 01/02/2021 with recommendations to add IV Bevacizumab 10 mg/kg q 3 weeks ( started 01/26/2021). Completed a full course of this therapy. --Patient was evaluated by Dr. VasloMickeal Skinner2/20/2022 and recommended to resume Bevacizumab due to worsening dizziness.  Which was given from Cycle 25-27.  Plan: --continue to follow with Dr. VasloMickeal Skinnervastin added back in the patient's chemotherapy regimen. Will restart in 4 weeks from today (out from dental procedure).  --brain MRI performed on 08/10/2022, no evidence of progressive disease..   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    # Hypertension: --Continue Norvasc to 10 mg daily.  --continue to monitor at home and in clinic.    Orders Placed This Encounter  Procedures   TSH    Standing Status:   Future    Standing Expiration Date:   10/19/2023   CBC with Differential ([facility])    Standing Status:   Future    Standing Expiration Date:    10/20/2023   CMP ([facility])    Standing Status:   Future    Standing Expiration Date:   10/20/2023   TSH    Standing Status:   Future    Standing Expiration Date:   11/09/2023   CBC with Differential ([facility])    Standing Status:   Future    Standing Expiration Date:   11/10/2023   CMP ([facility])    Standing Status:   Future    Standing Expiration Date:   11/10/2023   TSH    Standing Status:   Future  Standing Expiration Date:   11/30/2023   CBC with Differential ([facility] Only)    Standing Status:   Future    Standing Expiration Date:   12/01/2023   CMP ([facility] only)    Standing Status:   Future    Standing Expiration Date:   12/01/2023   TSH    Standing Status:   Future    Standing Expiration Date:   12/21/2023   CBC with Differential ([facility]cer Center Only)    Standing Status:   Future    Standing Expiration Date:   12/22/2023   CMP ([facility] only)    Standing Status:   Future    Standing Expiration Date:   12/22/2023    All questions were answered. The patient knows to call the clinic with any problems, questions or concerns.  I have spent a total of 30 minutes minutes of face-to-face and non-face-to-face time, preparing to see the patient, performing a medically appropriate examination, counseling and educating the patient,  documenting clinical information in the electronic health record, and care coordination.   Ledell Peoples, MD Department of Hematology/Oncology [place] at [facility] Phone: [phone] Pager: [phone] Email: [email or web address]   09/06/2022 1:29 PM   Literature Support:  Lorenza Chick, Rodrguez-Abreu D, Duffy Rhody, Felip E, De Angelis F, Domine M, [place], Hochmair MJ, [place], [place], Bischoff HG, [place], Grossi F, [place], Reck M, [place], [place], [place], Rubio-Viqueira B, Novello S, Kurata T, Gray JE, Vida J, Wei Z, Yang J, Raftopoulos H, [place],  [place] [employer]; [id number] Investigators. Pembrolizumab plus Chemotherapy in Metastatic Non-Small-Cell Lung Cancer. [place] Med. 2018 May 31;378(22):2078-2092.  --Median progression-free survival was 8.8 months (95% CI, 7.6 to 9.2) in the pembrolizumab-combination group and 4.9 months (95% CI, 4.7 to 5.5) in the placebo-combination group (hazard ratio for disease progression or death, 0.52; 95% CI, 0.43 to 0.64; P<0.001). Adverse events of grade 3 or higher occurred in 67.2% of the patients in the pembrolizumab-combination group and in 65.8% of those in the placebo-combination group.  Jodene Nam L. Efficacy of pemetrexed-based regimens in advanced non-small cell lung cancer patients with activating epidermal growth factor receptor mutations after tyrosine kinase inhibitor failure: a systematic review. Onco Targets Ther. 2018;11:2121-2129.   --The weighted median PFS, median OS, and ORR for patients treated with pem regimens were 5.09 months, 15.91 months, and 30.19%, respectively. Our systematic review results showed a favorable efficacy profile of pem regimens in NSCLC patients with EGFR mutation after EGFR-TKI failure.

## 2022-09-14 ENCOUNTER — Other Ambulatory Visit: Payer: Self-pay | Admitting: Hematology and Oncology

## 2022-09-16 MED ORDER — POTASSIUM CHLORIDE CRYS ER 20 MEQ PO TBCR: 40.0000 meq | EXTENDED_RELEASE_TABLET | Freq: Two times a day (BID) | ORAL | 0 refills | Status: DC

## 2022-09-20 ENCOUNTER — Other Ambulatory Visit: Payer: Medicare Other

## 2022-09-20 ENCOUNTER — Ambulatory Visit: Payer: Medicare Other

## 2022-09-20 ENCOUNTER — Ambulatory Visit: Payer: Medicare Other | Admitting: Internal Medicine

## 2022-09-25 ENCOUNTER — Other Ambulatory Visit: Payer: Self-pay | Admitting: Hematology and Oncology

## 2022-09-26 MED ORDER — OXYCODONE HCL 5 MG PO TABS: ORAL_TABLET | ORAL | 0 refills | Status: DC

## 2022-09-26 MED ORDER — XTAMPZA ER 13.5 MG PO C12A: 1.0000 | EXTENDED_RELEASE_CAPSULE | Freq: Two times a day (BID) | ORAL | 0 refills | Status: DC

## 2022-09-27 ENCOUNTER — Inpatient Hospital Stay: Payer: Medicare Other

## 2022-09-27 ENCOUNTER — Ambulatory Visit: Payer: BC Managed Care – PPO | Admitting: Hematology and Oncology

## 2022-09-27 ENCOUNTER — Other Ambulatory Visit: Payer: BC Managed Care – PPO

## 2022-09-27 ENCOUNTER — Inpatient Hospital Stay (HOSPITAL_BASED_OUTPATIENT_CLINIC_OR_DEPARTMENT_OTHER): Payer: Medicare Other | Admitting: Hematology and Oncology

## 2022-09-27 ENCOUNTER — Inpatient Hospital Stay: Payer: Medicare Other | Attending: Hematology and Oncology

## 2022-09-27 ENCOUNTER — Ambulatory Visit: Payer: BC Managed Care – PPO

## 2022-09-27 VITALS — BP 132/93 | HR 101 | Temp 98.1°F | Resp 18 | Wt 143.6 lb

## 2022-09-27 VITALS — HR 94

## 2022-09-27 DIAGNOSIS — Z86718 Personal history of other venous thrombosis and embolism: Secondary | ICD-10-CM | POA: Diagnosis not present

## 2022-09-27 DIAGNOSIS — R112 Nausea with vomiting, unspecified: Secondary | ICD-10-CM | POA: Diagnosis not present

## 2022-09-27 DIAGNOSIS — I10 Essential (primary) hypertension: Secondary | ICD-10-CM | POA: Diagnosis not present

## 2022-09-27 DIAGNOSIS — E876 Hypokalemia: Secondary | ICD-10-CM | POA: Insufficient documentation

## 2022-09-27 DIAGNOSIS — Z5112 Encounter for antineoplastic immunotherapy: Secondary | ICD-10-CM | POA: Insufficient documentation

## 2022-09-27 DIAGNOSIS — Z90710 Acquired absence of both cervix and uterus: Secondary | ICD-10-CM | POA: Diagnosis not present

## 2022-09-27 DIAGNOSIS — C7951 Secondary malignant neoplasm of bone: Secondary | ICD-10-CM | POA: Diagnosis not present

## 2022-09-27 DIAGNOSIS — R918 Other nonspecific abnormal finding of lung field: Secondary | ICD-10-CM

## 2022-09-27 DIAGNOSIS — C7931 Secondary malignant neoplasm of brain: Secondary | ICD-10-CM | POA: Insufficient documentation

## 2022-09-27 DIAGNOSIS — R42 Dizziness and giddiness: Secondary | ICD-10-CM | POA: Diagnosis not present

## 2022-09-27 DIAGNOSIS — R509 Fever, unspecified: Secondary | ICD-10-CM | POA: Diagnosis not present

## 2022-09-27 DIAGNOSIS — Z5111 Encounter for antineoplastic chemotherapy: Secondary | ICD-10-CM | POA: Diagnosis present

## 2022-09-27 DIAGNOSIS — Z79899 Other long term (current) drug therapy: Secondary | ICD-10-CM | POA: Diagnosis not present

## 2022-09-27 DIAGNOSIS — Z7901 Long term (current) use of anticoagulants: Secondary | ICD-10-CM | POA: Insufficient documentation

## 2022-09-27 DIAGNOSIS — C3431 Malignant neoplasm of lower lobe, right bronchus or lung: Secondary | ICD-10-CM | POA: Diagnosis not present

## 2022-09-27 DIAGNOSIS — E032 Hypothyroidism due to medicaments and other exogenous substances: Secondary | ICD-10-CM

## 2022-09-27 DIAGNOSIS — Z9181 History of falling: Secondary | ICD-10-CM | POA: Insufficient documentation

## 2022-09-27 DIAGNOSIS — Z9049 Acquired absence of other specified parts of digestive tract: Secondary | ICD-10-CM | POA: Diagnosis not present

## 2022-09-27 LAB — CBC WITH DIFFERENTIAL (CANCER CENTER ONLY)
Abs Immature Granulocytes: 0.02 10*3/uL (ref 0.00–0.07)
Basophils Absolute: 0.1 10*3/uL (ref 0.0–0.1)
Basophils Relative: 1 %
Eosinophils Absolute: 0.2 10*3/uL (ref 0.0–0.5)
Eosinophils Relative: 2 %
HCT: 38.1 % (ref 36.0–46.0)
Hemoglobin: 13.2 g/dL (ref 12.0–15.0)
Immature Granulocytes: 0 %
Lymphocytes Relative: 30 %
Lymphs Abs: 2.4 10*3/uL (ref 0.7–4.0)
MCH: 33.5 pg (ref 26.0–34.0)
MCHC: 34.6 g/dL (ref 30.0–36.0)
MCV: 96.7 fL (ref 80.0–100.0)
Monocytes Absolute: 1 10*3/uL (ref 0.1–1.0)
Monocytes Relative: 12 %
Neutro Abs: 4.5 10*3/uL (ref 1.7–7.7)
Neutrophils Relative %: 55 %
Platelet Count: 408 10*3/uL — ABNORMAL HIGH (ref 150–400)
RBC: 3.94 MIL/uL (ref 3.87–5.11)
RDW: 13.3 % (ref 11.5–15.5)
WBC Count: 8.2 10*3/uL (ref 4.0–10.5)
nRBC: 0 % (ref 0.0–0.2)

## 2022-09-27 LAB — CMP (CANCER CENTER ONLY)
ALT: 11 U/L (ref 0–44)
AST: 20 U/L (ref 15–41)
Albumin: 3.3 g/dL — ABNORMAL LOW (ref 3.5–5.0)
Alkaline Phosphatase: 57 U/L (ref 38–126)
Anion gap: 7 (ref 5–15)
BUN: 11 mg/dL (ref 8–23)
CO2: 30 mmol/L (ref 22–32)
Calcium: 9.3 mg/dL (ref 8.9–10.3)
Chloride: 103 mmol/L (ref 98–111)
Creatinine: 0.67 mg/dL (ref 0.44–1.00)
GFR, Estimated: >60 mL/min (ref >60)
Glucose, Bld: 93 mg/dL (ref 70–99)
Potassium: 3.7 mmol/L (ref 3.5–5.1)
Sodium: 140 mmol/L (ref 135–145)
Total Bilirubin: 0.4 mg/dL (ref 0.3–1.2)
Total Protein: 6.8 g/dL (ref 6.5–8.1)

## 2022-09-27 LAB — TSH: TSH: 2.894 u[IU]/mL (ref 0.350–4.500)

## 2022-09-27 MED ORDER — SODIUM CHLORIDE 0.9 % IV SOLN
200.0000 mg | Freq: Once | INTRAVENOUS | Status: AC
  Administered 2022-09-27: 200 mg via INTRAVENOUS
  Filled 2022-09-27: qty 200

## 2022-09-27 MED ORDER — SODIUM CHLORIDE 0.9 % IV SOLN: Freq: Once | INTRAVENOUS | Status: AC

## 2022-09-27 MED ORDER — PROCHLORPERAZINE MALEATE 10 MG PO TABS
10.0000 mg | ORAL_TABLET | Freq: Once | ORAL | Status: AC
  Administered 2022-09-27: 10 mg via ORAL
  Filled 2022-09-27: qty 1

## 2022-09-27 MED ORDER — SODIUM CHLORIDE 0.9 % IV SOLN
500.0000 mg/m2 | Freq: Once | INTRAVENOUS | Status: AC
  Administered 2022-09-27: 900 mg via INTRAVENOUS
  Filled 2022-09-27: qty 20

## 2022-09-27 NOTE — Patient Instructions (Signed)
[facility] MEDICAL [facility]COLOGY  Discharge Instructions: Thank you for choosing [facility] to provide your oncology and hematology care.   If you have a lab appointment with the Cancer Center, please go directly to the Cancer Center and check in at the registration area.   Wear comfortable clothing and clothing appropriate for easy access to any Portacath or PICC line.   We strive to give you quality time with your provider. You may need to reschedule your appointment if you arrive late (15 or more minutes).  Arriving late affects you and other patients whose appointments are after yours.  Also, if you miss three or more appointments without notifying the office, you may be dismissed from the clinic at the provider's discretion.      For prescription refill requests, have your pharmacy contact our office and allow 72 hours for refills to be completed.    Today you received the following chemotherapy and/or immunotherapy agents: Keytruda/Alimta      To help prevent nausea and vomiting after your treatment, we encourage you to take your nausea medication as directed.  BELOW ARE SYMPTOMS THAT SHOULD BE REPORTED IMMEDIATELY: *FEVER GREATER THAN 100.4 F (38 C) OR HIGHER *CHILLS OR SWEATING *NAUSEA AND VOMITING THAT IS NOT CONTROLLED WITH YOUR NAUSEA MEDICATION *UNUSUAL SHORTNESS OF BREATH *UNUSUAL BRUISING OR BLEEDING *URINARY PROBLEMS (pain or burning when urinating, or frequent urination) *BOWEL PROBLEMS (unusual diarrhea, constipation, pain near the anus) TENDERNESS IN MOUTH AND THROAT WITH OR WITHOUT PRESENCE OF ULCERS (sore throat, sores in mouth, or a toothache) UNUSUAL RASH, SWELLING OR PAIN  UNUSUAL VAGINAL DISCHARGE OR ITCHING   Items with * indicate a potential emergency and should be followed up as soon as possible or go to the Emergency Department if any problems should occur.  Please show the CHEMOTHERAPY ALERT CARD or IMMUNOTHERAPY ALERT CARD at  check-in to the Emergency Department and triage nurse.  Should you have questions after your visit or need to cancel or reschedule your appointment, please contact [facility]  Dept: [phone]  and follow the prompts.  Office hours are 8:00 a.m. to 4:30 p.m. Monday - Friday. Please note that voicemails left after 4:00 p.m. may not be returned until the following business day.  We are closed weekends and major holidays. You have access to a nurse at all times for urgent questions. Please call the main number to the clinic Dept: [phone] and follow the prompts.   For any non-urgent questions, you may also contact your provider using MyChart. We now offer e-Visits for anyone 53 and older to request care online for non-urgent symptoms. For details visit [email or web address].   Also download the MyChart app! Go to the app store, search "MyChart", open the app, select , and log in with your MyChart username and password.

## 2022-09-27 NOTE — Progress Notes (Signed)
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 CT of the head showed showed concerning for metastatic disease involving the left cerebellum and right occipital lobe.  2) 09/30/2019: MRI brain confirms mass within the left cerebellum measures 1.6 x 1.3 x 1.5 cm as well as at least 8 metastatic lesions within the supratentorial and infratentorial brain as outlined 3) 10/01/2019: PET CT scan revealed hypermetabolic infrahilar right lower lobe mass with hypermetabolic nodal metastases in the right hilum, mediastinum, right supraclavicular region, left axilla and lower left neck. 4) 10/08/2019: US guided biopsy of the lymph nodes confirms poorly differentiated non-small cell carcinoma. Immunohistochemistry confirms adenocarcinoma of lung primary. PD-L1 and NGS later revealed a MET Exon 14 mutation and TPS of 90%.  5) 10/12/2019: Establish care with Dr. Lorenso Courier  6) 11/04/2019: Day 1 of capmatinib 427m BID 7) 11/09/2019: presented to Rad/Onc with a DVT in LLE. Seen in symptom management clinic and started on apixaban.  8) 12/29/2019: CT C/A/P showed interval decrease in size of mass involving the superior segment of right lower lobe and reduction in mediastinal and left supraclavicular adenopathy though some small spinal lesions were noted in the interim between the two sets of scans 9) 01/25/2020: temporarily stopped capmatinib due to symptoms of nausea/fatigue. Restarted therapy on 01/30/2020 after brief chemo holiday.  10)  03/22/2020: CT C/A/P reveals a mixed picture, with new lung nodule and increased lymphadenopathy, however response in bone lesions. D/c capmatinib therapy 11) 04/08/2020: start of Carbo/Pem/Pem for 2nd line treatment. Cycle 1 Day 1   12) 04/29/2020: Cycle 2 Day 1 Carbo/Pem/Pem  13) 05/20/2020: Cycle 3 Day 1 Carbo/Pem/Pem  14) 06/09/2020: Cycle 4 Day 1 Carbo/Pem/Pem  15) 06/29/2020: Cycle 5 Day 1 maintenance Pem/Pem  16) 07/20/2020: Cycle 6 Day 1 maintenance Pem/Pem  17) 08/12/2020: Cycle 7 Day 1 maintenance Pem/Pem  18) 09/01/2020: Cycle 8 Day 1 maintenance Pem/Pem  19) 09/21/2020: Cycle 9 Day 1 maintenance Pem/Pem  20) 10/13/2020: Cycle 10 Day 1 maintenance Pem/Pem  21) 11/03/2020: Cycle 11 Day 1 maintenance Pem/Pem  22) 11/25/2020:  Cycle 12 Day 1 maintenance Pem/Pem 23) 12/15/2020:  Cycle 13 Day 1 maintenance Pem/Pem 24) 01/05/2021:  Cycle 14 Day 1 maintenance Pem/Pem 25) 01/26/2021:  Cycle 15 Day 1 maintenance Pem/Pem plus Bevacizumab.  02/17/2021: Cycle 16 Day 1 maintenance Pem/Pem plus Bevacizumab.  03/10/2021: Cycle 17 Day 1 maintenance Pem/Pem plus Bevacizumab.  03/31/2021: Cycle 18 Day 1 maintenance Pem/Pem  04/20/2021:  Cycle 19 Day 1 maintenance Pem/Pem  05/12/2021: Cycle 20 Day 1 maintenance Pem/Pem 06/01/2021: Cycle 21 Day 1 maintenance Pem/Pem 06/22/2021: Cycle 22 Day 1 maintenance Pem/Pem 07/13/2021: Cycle 23 Day 1 maintenance Pem/Pem 08/16/2021: Cycle 24 Day 1 maintenance Pem/Pem 09/08/2021: Cycle 25 Day 1 maintenance Pem/Pem plus Bevacizumab 09/28/2021: Cycle 26 Day 1 maintenance Pem/Pem plus Bevacizumab 10/20/2021: Cycle 27 Day 1 maintenance Pem/Pem plus Bevacizumab 11/09/2021: Cycle 28 Day 1 maintenance Pem/Pem  12/06/2021: Cycle 29 Day 1 maintenance Pem/Pem  12/27/2021: Cycle 30 Day 1 maintenance Pem/Pem  01/18/2022: Cycle 31 Day 1 maintenance Pem/Pem  02/07/2022: Cycle 32 Day  1 maintenance Pem/Pem  02/28/2022: Cycle 33 Day 1 maintenance Pem/Pem  03/23/2022: Cycle 34 Day 1 maintenance Pem/Pem   04/13/2022: Cycle 35 Day 1 maintenance Pem/Pem  05/04/2022: Cycle 36 Day 1 maintenance Pem/Pem  05/25/2022: Cycle 37 Day 1 maintenance Pem/Pem  06/14/2022: Cycle 38 Day 1 maintenance Pem/Pem 07/05/2022: Cycle 39 Day 1 maintenance Pem/Pem 08/13/2022: Cycle 40 Day 1 maintenance Pem/Pem 09/06/2022:Cycle 41 Day 1 maintenance Pem/Pem 09/27/2022: Cycle 42 Day 1 maintenance Pem/Pem  Interval History:  Virginia [name] 67 y.o. female with medical history significant for metastatic adenocarcinoma of the lung presents for a follow up visit. The patient's last visit was on 09/06/2022.  In the interim since the last visit she has continued on maintenance Pem/Pem.   On exam today Virginia [name] reports she has been well in the interim since her last visit.  She has been cutting back on her diuretic as she is not having as much in the way of leg swelling.  She notes that she has been spiking some low-grade temperatures up to 100 F and does feel feverish at times.  She notes that she is also been faithfully taking her potassium pills and also trying to eat more potatoes to increase her potassium levels.  She notes that she has not been having any trouble with nausea or vomiting and is faithfully taking her Zofran and Zyprexa.  She continues to take Xtampza twice daily as well as 2-3 oxycodones as needed.  She notes that her dizziness is "the same to worse".  She otherwise denies any changes in her health.  She denies any fevers, chills, sweats, vomiting, diarrhea.  Full 10 point ROS was otherwise negative  MEDICAL HISTORY:  Past Medical History:  Diagnosis Date   Anemia    Anxiety    Concussion 09/28/2019   DVT (deep venous thrombosis) (HCC) 2021   L leg   Dyspnea    GERD (gastroesophageal reflux disease)    Hypercholesterolemia    per pt, she does not have elevated lipids   Hypertension    met lung ca dx'd 09/2019   mets to spine, hip and brain   PONV (postoperative nausea and vomiting)    Tobacco abuse      SURGICAL HISTORY: Past Surgical History:  Procedure Laterality Date   ABDOMINAL HYSTERECTOMY     partial/ left ovaries   APPLICATION OF CRANIAL NAVIGATION N/A 12/02/2020   Procedure: APPLICATION OF CRANIAL NAVIGATION;  Surgeon: Maeola Harman, MD;  Location: [facility] OR;  Service: Neurosurgery;  Laterality: N/A;   CHOLECYSTECTOMY     CRANIOTOMY N/A 12/02/2020   Procedure: Posterior fossa craniotomy for tumor resection with brainlab;  Surgeon: Maeola Harman, MD;  Location: [facility] OR;  Service: Neurosurgery;  Laterality: N/A;   DILATION AND CURETTAGE OF UTERUS     IR IMAGING GUIDED PORT INSERTION  10/23/2019   IR PATIENT EVAL TECH 0-60 MINS  11/21/2021   IR PATIENT EVAL TECH 0-60 MINS  11/24/2021   IR PATIENT EVAL TECH 0-60 MINS  11/29/2021   IR PATIENT EVAL TECH 0-60 MINS  12/04/2021   IR PATIENT EVAL TECH 0-60 MINS  12/12/2021   IR PATIENT EVAL TECH 0-60 MINS  12/19/2021   IR PATIENT EVAL TECH 0-60 MINS  12/27/2021   IR PATIENT EVAL TECH 0-60 MINS  01/03/2022   IR PATIENT EVAL TECH 0-60 MINS  01/10/2022   IR PATIENT EVAL TECH 0-60 MINS  01/18/2022   IR PATIENT EVAL TECH 0-60 MINS  02/13/2022   IR  PATIENT EVAL TECH 0-60 MINS  02/20/2022   IR RADIOLOGIST EVAL & MGMT  01/23/2022   IR REMOVAL TUN ACCESS W/ PORT W/O FL MOD SED  11/17/2021   KYPHOPLASTY N/A 03/15/2020   Procedure: Thoracic Eight KYPHOPLASTY;  Surgeon: Maeola Harman, MD;  Location: [facility] OR;  Service: Neurosurgery;  Laterality: N/A;  prone    LIPOMA EXCISION  2018   removed under left breast and right thigh.   TUBAL LIGATION      ALLERGIES:  has No Known Allergies.  MEDICATIONS:  Current Outpatient Medications  Medication Sig Dispense Refill   acetaminophen (TYLENOL) 500 MG tablet Take 500 mg by mouth 2 (two) times daily as needed for moderate pain.     albuterol (VENTOLIN HFA) 108 (90 Base) MCG/ACT inhaler INHALE 2 PUFFS INTO THE LUNGS EVERY 6 HOURS AS NEEDED FOR WHEEZING OR SHORTNESS OF BREATH 6.7 g 11   ALPRAZolam (XANAX) 0.25 MG tablet Take  1 tablet (0.25 mg total) by mouth 2 (two) times daily as needed for anxiety 60 tablet 0   amLODipine (NORVASC) 10 MG tablet Take 1 tablet (10 mg total) by mouth daily. 90 tablet 3   Cyanocobalamin (VITAMIN B12 PO) Take 1 tablet by mouth daily. Gummies     EPINEPHrine 0.3 mg/0.3 mL IJ SOAJ injection Inject 0.3 mg into the muscle as needed for anaphylaxis. 2 each 0   folic acid (FOLVITE) 1 MG tablet Take 1 tablet by mouth daily. 90 tablet 1   furosemide (LASIX) 20 MG tablet Take 2 tablets (40 mg total) by mouth 2 times daily. 360 tablet 0   Glucosamine HCl (GLUCOSAMINE PO) Take 2 tablets by mouth daily.     lidocaine-prilocaine (EMLA) cream Apply to port as needed. 30 g 0   LYSINE PO Take 1 tablet by mouth daily.     meclizine (ANTIVERT) 25 MG tablet Take 1 tablet (25 mg total) by mouth 3 (three) times daily as needed for dizziness. 30 tablet 0   Multiple Vitamin (MULTIVITAMIN WITH MINERALS) TABS tablet Take 1 tablet by mouth daily.     Multiple Vitamins-Minerals (AIRBORNE PO) Take 1 tablet by mouth daily.      OLANZapine (ZYPREXA) 2.5 MG tablet TAKE 1 TABLET BY MOUTH AT BEDTIME 90 tablet 1   ondansetron (ZOFRAN) 4 MG tablet TAKE 1 TABLET(4 MG) BY MOUTH EVERY 8 HOURS AS NEEDED FOR NAUSEA OR VOMITING 40 tablet 2   OVER THE COUNTER MEDICATION Take 2 tablets by mouth daily. Beet root     oxyCODONE (OXY IR/ROXICODONE) 5 MG immediate release tablet TAKE 1 TO 2 TABLETS BY MOUTH EVERY 4 HOURS AS NEEDED FOR SEVERE PAIN 90 tablet 0   pantoprazole (PROTONIX) 40 MG tablet Take 1 tablet (40 mg total) by mouth daily. 90 tablet 3   polyethylene glycol (MIRALAX / GLYCOLAX) 17 g packet Take 17 g by mouth every evening.     potassium chloride SA (KLOR-CON M) 20 MEQ tablet Take 2 tablets (40 mEq total) by mouth 2 (two) times daily. 360 tablet 0   Tetrahydrozoline HCl (VISINE OP) Place 1 drop into both eyes daily as needed (irritation).     VITAMIN D PO Take 1 capsule by mouth daily.     vitamin E 180 MG (400 UNITS)  capsule Take 400 IU daily x 1 week, then 400 IU BID (Patient taking differently: Take 400 Units by mouth daily.) 60 capsule 4   XTAMPZA ER 13.5 MG C12A Take 1 capsule by mouth in the morning and  at bedtime. (every 12 hours) 60 capsule 0   No current facility-administered medications for this visit.   Facility-Administered Medications Ordered in Other Visits  Medication Dose Route Frequency Provider Last Rate Last Admin   PEMEtrexed (ALIMTA) 900 mg in sodium chloride 0.9 % 100 mL chemo infusion  500 mg/m2 (Treatment Plan Recorded) Intravenous Once Orson Slick, MD        REVIEW OF SYSTEMS:   Constitutional: ( - ) fevers, ( - )  chills , ( - ) night sweats Eyes: ( - ) blurriness of vision, ( - ) double vision, ( - ) watery eyes Ears, nose, mouth, throat, and face: ( - ) mucositis, ( - ) sore throat Respiratory: ( - ) cough, ( + ) dyspnea, ( - ) wheezes Cardiovascular: ( - ) palpitation, ( - ) chest discomfort, ( - ) lower extremity swelling Gastrointestinal:  ( + ) nausea, ( - ) heartburn, ( - ) change in bowel habits Skin: ( - ) abnormal skin rashes Lymphatics: ( - ) new lymphadenopathy, ( - ) easy bruising Neurological: ( - ) numbness, ( - ) tingling, ( - ) new weaknesses Behavioral/Psych: ( - ) mood change, ( - ) new changes  All other systems were reviewed with the patient and are negative.  PHYSICAL EXAMINATION: ECOG PERFORMANCE STATUS: 1 - Symptomatic but completely ambulatory  Vitals:   09/27/22 1424  BP: (!) 132/93  Pulse: (!) 101  Resp: 18  Temp: 98.1 F (36.7 C)  SpO2: 97%       Filed Weights   09/27/22 1424  Weight: 143 lb 9.6 oz (65.1 kg)     GENERAL: well appearing middle aged Caucasian female in NAD  SKIN: skin color, texture, turgor are normal, no rashes or significant lesions EYES: conjunctiva are pink and non-injected, sclera clear LUNGS: wheezing at lung bases. clear to auscultation and percussion with normal breathing effort HEART: regular rate &  rhythm and no murmurs and bilateral trace pitting lower extremity edema Musculoskeletal: no cyanosis of digits and no clubbing PSYCH: alert & oriented x 3, fluent speech NEURO: no focal motor/sensory deficits  LABORATORY DATA:  I have reviewed the data as listed    Latest Ref Rng & Units 09/27/2022    1:38 PM 09/06/2022   10:50 AM 08/13/2022   10:24 AM  CBC  WBC 4.0 - 10.5 K/uL 8.2  6.3  6.2   Hemoglobin 12.0 - 15.0 g/dL 13.2  13.4  14.2   Hematocrit 36.0 - 46.0 % 38.1  38.5  40.3   Platelets 150 - 400 K/uL 408  300  249        Latest Ref Rng & Units 09/27/2022    1:38 PM 09/06/2022   10:50 AM 08/13/2022   10:24 AM  CMP  Glucose 70 - 99 mg/dL 93  108  102   BUN 8 - 23 mg/dL 11  13  9    Creatinine 0.44 - 1.00 mg/dL 0.67  0.63  0.69   Sodium 135 - 145 mmol/L 140  139  138   Potassium 3.5 - 5.1 mmol/L 3.7  3.7  3.0   Chloride 98 - 111 mmol/L 103  104  97   CO2 22 - 32 mmol/L 30  28  32   Calcium 8.9 - 10.3 mg/dL 9.3  9.7  9.6   Total Protein 6.5 - 8.1 g/dL 6.8  7.2  7.6   Total Bilirubin 0.3 - 1.2 mg/dL 0.4  0.5  0.5   Alkaline Phos 38 - 126 U/L 57  64  63   AST 15 - 41 U/L 20  18  19    ALT 0 - 44 U/L 11  12  10       RADIOGRAPHIC STUDIES: I personally have viewed the radiographic studies below: No results found.   ASSESSMENT & PLAN FELICITE ZEIMET 67 y.o. female with medical history significant for metastatic adenocarcinoma of the lung presents for a follow up visit. 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   Previously we discussed Carboplatin/Pembrolizumab/Pemetrexed and the expected side effect from these medications.  We discussed the possible side effects of immunotherapy including colitis, hepatitis, dermatitis, and pneumonitis.  Additionally we discussed the side effects of carboplatin which include suppression of blood counts, nephrotoxicity, and nausea.  Additionally we discussed pemetrexed which can also have effects on  counts and would require supplementation with vitamin B12 and folic acid.  She voiced her understanding of these side effects and treatment plans moving forward.  I also noted that we would be able to drop the chemotherapy agents that she had intolerance to continue with pembrolizumab alone given her high TPS score.  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 Due to worsening dizziness, she resumed this from Cycle 25-27.  --repeat CT CAP on 12/11/2021 showed stable disease with no evidence of progression. Next due in July 2023.  PLAN: --Labs from today were reviewed and adequate for treatment.  Labs today show creatinine 0.67, white blood cell 8.2. Hgb 13.2, MCV 96.7, Plt 408  --Continue with Cycle 42, Day 1 of maintenance Pem/Pem. --RTC q 3 weeks for labs, follow up visit prior to Cycle 43.    #Nausea/Vomiting, improving -- provided patient with Zofran 4-8mg  q8h PRN and compazine 10mg  q6H for breakthrough PRN.  She is currently using Phenergan for breakthrough. --continue olanzpaine 2.5 mg PO QHS to help with nausea. Marked improvement since the addition of this medication on 01/29/2020.  --continue to monitor   #Hypokalemia, stable --likely 2/2 to decreased PO intake, hypovolemia, and BP medications --Potassium level 3.7 today.   Recommend that she continue oral potassium chloride with treatment --Currently on potassium chloride tablets, 40 mEq PO potassium in AM and August 2023 in PM.   #Pain Control, stable --continue oxycodone 5mg  q4H PRN. Can increase dose to 10mg  q6H if pain is severe. She is taking approximately 2-3 pills per day.  --continue Xtampza 13.5 mg ER BID for long acting support.  --continue to take with Senokot to prevent opioid induced constipation. Miralax PRN    #Supportive Therapy --continue EMLA cream with port --zometa 4g IV q 12 weeks for bone metastasis, dental clearance received. Last 01/18/2022, next dose currently due.  --nausea as above    #Brain Metastasis, stable --appreciate the assistance of Dr. 01/31/2020 in treating her brain metastasis and symptoms. --radiation therapy to the brain completed on 11/16/2019.  --MRI brain on 04/29/2020 showed evidence of small brain bleed. Eliquis therapy was discontinued.  --MRI on 11/04/2020 showed concern for progression of intracranial disease.  --On 12/02/2020, patient underwent craniotomy, resection of progressive cerebellar mass by Dr. Barbaraann Cao. 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  --MRI on 12/23/2020 showed evidence of radiation necrosis. Dr. 05/01/2020 evaluated the patient on 01/02/2021 with recommendations  to add IV Bevacizumab 10 mg/kg q 3 weeks ( started 01/26/2021). Completed a full course of this therapy. --Patient was evaluated by Dr. Barbaraann Cao on 08/29/2021 and recommended to resume Bevacizumab due to worsening dizziness.  Which was given from Cycle 25-27.  Plan: --continue to follow with Dr. Barbaraann Cao  --Avastin added back in the patient's chemotherapy regimen. Will restart in 4 weeks from today (out from dental procedure).  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    # Hypertension: --Continue Norvasc to 10 mg daily.  --continue to monitor at home and in clinic.    Orders Placed This Encounter  Procedures   CT CHEST ABDOMEN PELVIS W CONTRAST    Standing Status:   Future    Standing Expiration Date:   09/28/2023    Order Specific Question:   If indicated for the ordered procedure, I authorize the administration of contrast media per Radiology protocol    Answer:   Yes    Order  Specific Question:   Does the patient have a contrast media/X-ray dye allergy?    Answer:   Yes    Order Specific Question:   Preferred imaging location?    Answer:   [facility]    Order Specific Question:   Is Oral Contrast requested for this exam?    Answer:   Yes, Per Radiology protocol    All questions were answered. The patient knows to call the clinic with any problems, questions or concerns.  I have spent a total of 30 minutes minutes of face-to-face and non-face-to-face time, preparing to see the patient, performing a medically appropriate examination, counseling and educating the patient,  documenting clinical information in the electronic health record, and care coordination.   Ulysees Barns, MD Department of Hematology/Oncology [facility] Phone: [phone] Pager: [phone] Email: [email or web address]   09/27/2022 4:19 PM   Literature Support:  Lynn Ito, Rodrguez-Abreu D, Naaman Plummer, Felip E, De Angelis F, Domine M, [place], Hochmair MJ, [place], [place], [address], [place], Grossi F, [place] , Reck M, [place], [place], [place], Rubio-Viqueira B, Novello S, Kurata T, Gray JE, Vida J, Wei Z, Yang J, Raftopoulos H, [place], [place] [employer]; [id number]. Pembrolizumab plus Chemotherapy in Metastatic Non-Small-Cell Lung Cancer. Malva Limes Med. 2018 May 31;378(22):2078-2092.  --Median progression-free survival was 8.8 months (95% CI, 7.6 to 9.2) in the pembrolizumab-combination group and 4.9 months (95% CI, 4.7 to 5.5) in the placebo-combination group (hazard ratio for disease progression or death, 0.52; 95% CI, 0.43 to 0.64; P<0.001). Adverse events of grade 3 or higher occurred in 67.2% of the patients in the pembrolizumab-combination group and in 65.8% of those in the placebo-combination group.  Cristal Generous L. Efficacy of pemetrexed-based regimens in advanced non-small cell  lung cancer patients with activating epidermal growth factor receptor mutations after tyrosine kinase inhibitor failure: a systematic review. Onco Targets Ther. 2018;11:2121-2129.   --The weighted median PFS, median OS, and ORR for patients treated with pem regimens were 5.09 months, 15.91 months, and 30.19%, respectively. Our systematic review results showed a favorable efficacy profile of pem regimens in NSCLC patients with EGFR mutation after EGFR-TKI failure.

## 2022-10-03 ENCOUNTER — Telehealth: Payer: Self-pay | Admitting: Hematology and Oncology

## 2022-10-03 ENCOUNTER — Telehealth: Payer: Self-pay

## 2022-10-03 NOTE — Telephone Encounter (Signed)
Patient's spouse called to confirm and change treatment appointments. R/s appointments where appropriate and patient will be notified.

## 2022-10-03 NOTE — Telephone Encounter (Signed)
Called husband to advise of CT CAP appointment 10/10/21 at [facility].  Arrive at 10:15 for 10:30 scan. NPO 4 hours prior.

## 2022-10-10 ENCOUNTER — Ambulatory Visit (HOSPITAL_COMMUNITY)
Admission: RE | Admit: 2022-10-10 | Discharge: 2022-10-10 | Disposition: A | Discharge status: Home / Self Care | Payer: Medicare Other | Source: Ambulatory Visit | Attending: Hematology and Oncology | Admitting: Hematology and Oncology

## 2022-10-10 ENCOUNTER — Telehealth: Payer: Self-pay

## 2022-10-10 ENCOUNTER — Other Ambulatory Visit: Payer: Self-pay | Admitting: Hematology and Oncology

## 2022-10-10 DIAGNOSIS — C7931 Secondary malignant neoplasm of brain: Secondary | ICD-10-CM

## 2022-10-10 DIAGNOSIS — I3139 Other pericardial effusion (noninflammatory): Secondary | ICD-10-CM | POA: Insufficient documentation

## 2022-10-10 DIAGNOSIS — C7951 Secondary malignant neoplasm of bone: Secondary | ICD-10-CM | POA: Diagnosis not present

## 2022-10-10 DIAGNOSIS — C3490 Malignant neoplasm of unspecified part of unspecified bronchus or lung: Secondary | ICD-10-CM | POA: Insufficient documentation

## 2022-10-10 DIAGNOSIS — R918 Other nonspecific abnormal finding of lung field: Secondary | ICD-10-CM | POA: Diagnosis present

## 2022-10-10 MED ORDER — IOHEXOL 300 MG/ML  SOLN
100.0000 mL | Freq: Once | INTRAMUSCULAR | Status: AC | PRN
  Administered 2022-10-10: 100 mL via INTRAVENOUS

## 2022-10-10 NOTE — Telephone Encounter (Addendum)
Spoke with husband to relay message below.  ----- Message from Orson Slick, MD sent at 10/10/2022  4:25 PM EST ----- Please let Virginia [name] know that her CT scan is stable. No evidence of progressive disease.  ----- Message ----- From: Interface, Rad Results In Sent: 10/10/2022   3:41 PM EST To: Orson Slick, MD

## 2022-10-11 ENCOUNTER — Ambulatory Visit
Admission: RE | Admit: 2022-10-11 | Discharge: 2022-10-11 | Disposition: A | Discharge status: Home / Self Care | Payer: Medicare Other | Source: Ambulatory Visit | Attending: Internal Medicine | Admitting: Internal Medicine

## 2022-10-11 ENCOUNTER — Encounter: Payer: Self-pay | Admitting: Hematology and Oncology

## 2022-10-11 DIAGNOSIS — Y842 Radiological procedure and radiotherapy as the cause of abnormal reaction of the patient, or of later complication, without mention of misadventure at the time of the procedure: Secondary | ICD-10-CM

## 2022-10-11 DIAGNOSIS — C7931 Secondary malignant neoplasm of brain: Secondary | ICD-10-CM

## 2022-10-11 MED ORDER — GADOPICLENOL 0.5 MMOL/ML IV SOLN
7.0000 mL | Freq: Once | INTRAVENOUS | Status: AC | PRN
  Administered 2022-10-11: 7 mL via INTRAVENOUS

## 2022-10-15 ENCOUNTER — Inpatient Hospital Stay: Payer: Medicare Other

## 2022-10-15 ENCOUNTER — Inpatient Hospital Stay: Payer: Medicare Other | Attending: Hematology and Oncology | Admitting: Internal Medicine

## 2022-10-15 VITALS — BP 117/91 | HR 104 | Temp 97.8°F | Resp 17 | Wt 140.7 lb

## 2022-10-15 DIAGNOSIS — Z90710 Acquired absence of both cervix and uterus: Secondary | ICD-10-CM | POA: Diagnosis not present

## 2022-10-15 DIAGNOSIS — C7931 Secondary malignant neoplasm of brain: Secondary | ICD-10-CM | POA: Insufficient documentation

## 2022-10-15 DIAGNOSIS — Y842 Radiological procedure and radiotherapy as the cause of abnormal reaction of the patient, or of later complication, without mention of misadventure at the time of the procedure: Secondary | ICD-10-CM | POA: Diagnosis not present

## 2022-10-15 DIAGNOSIS — Z5112 Encounter for antineoplastic immunotherapy: Secondary | ICD-10-CM | POA: Insufficient documentation

## 2022-10-15 DIAGNOSIS — I3139 Other pericardial effusion (noninflammatory): Secondary | ICD-10-CM | POA: Insufficient documentation

## 2022-10-15 DIAGNOSIS — Z86718 Personal history of other venous thrombosis and embolism: Secondary | ICD-10-CM | POA: Diagnosis not present

## 2022-10-15 DIAGNOSIS — Z87891 Personal history of nicotine dependence: Secondary | ICD-10-CM | POA: Insufficient documentation

## 2022-10-15 DIAGNOSIS — Z79899 Other long term (current) drug therapy: Secondary | ICD-10-CM | POA: Diagnosis not present

## 2022-10-15 DIAGNOSIS — C3431 Malignant neoplasm of lower lobe, right bronchus or lung: Secondary | ICD-10-CM | POA: Diagnosis present

## 2022-10-15 DIAGNOSIS — I6789 Other cerebrovascular disease: Secondary | ICD-10-CM | POA: Diagnosis not present

## 2022-10-15 DIAGNOSIS — K449 Diaphragmatic hernia without obstruction or gangrene: Secondary | ICD-10-CM | POA: Diagnosis not present

## 2022-10-15 DIAGNOSIS — Z9049 Acquired absence of other specified parts of digestive tract: Secondary | ICD-10-CM | POA: Insufficient documentation

## 2022-10-15 DIAGNOSIS — Z5111 Encounter for antineoplastic chemotherapy: Secondary | ICD-10-CM | POA: Diagnosis present

## 2022-10-15 DIAGNOSIS — Z7901 Long term (current) use of anticoagulants: Secondary | ICD-10-CM | POA: Diagnosis not present

## 2022-10-15 DIAGNOSIS — R42 Dizziness and giddiness: Secondary | ICD-10-CM | POA: Diagnosis not present

## 2022-10-15 NOTE — Progress Notes (Signed)
[facility] [place], [place] [address] [phone]   Interval Evaluation  Date of Service: 10/15/22 Patient Name: Virginia [name] Patient MRN: [id number] Patient DOB: July 08, 1956 Provider: Ventura Sellers, MD  Identifying Statement:  Virginia [name] is a 67 y.o. 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 Isidore Moos 12/02/20: Craniotomy, resection of progressive cerebellar mass by Dr. Vertell Limber.  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  She continues to experience high overall burden of dizziness, vertigo which limits her ability to walk and perform activities of daily living.  This is essentially unchanged in recent months.  Sleep burden remains high, 16 hours.  Continues on Pem/pem with Dr. Lorenso Courier.  No seizures or headaches.  H+P (10/15/19) Patient presents to clinic to discuss recent neurologic symptoms and imaging findings.  She describes a fall while climbing stairs on 1/20; she hit the back of her and lost consciousness.  She is unclear whether she lost her balance or had mechanical provocation, but she had been drinking that evening.  She was treated initially as a trauma, CT demonstrated a lesion in the posterior fossa, later confirmed as a metastasis by MRI, along with 7 additional smaller lesions.  It took her almost two weeks to return for cognition and balance to return to "normal" which she feels she is about at right now.  Baseline she is high functioning and independent, works as a Neurosurgeon.  Has been taking decadron 2mg  twice per day.  Plans to initiate chemotherapy soon with Dr.  Lorenso Courier.  Medications: Current Outpatient Medications on File Prior to Visit  Medication Sig Dispense Refill   acetaminophen (TYLENOL) 500 MG tablet Take 500 mg by mouth 2 (two) times daily as needed for moderate pain.     albuterol (VENTOLIN HFA) 108 (90 Base) MCG/ACT inhaler INHALE 2 PUFFS INTO THE LUNGS EVERY 6 HOURS AS NEEDED FOR WHEEZING OR SHORTNESS OF BREATH 6.7 g 11   ALPRAZolam (XANAX) 0.25 MG tablet Take 1 tablet (0.25 mg total) by mouth 2 (two) times daily as needed for anxiety 60 tablet 0   amLODipine (NORVASC) 10 MG tablet Take 1 tablet (10 mg total) by mouth daily. 90 tablet 3   Cyanocobalamin (VITAMIN B12 PO) Take 1 tablet by mouth daily. Gummies     EPINEPHrine 0.3 mg/0.3 mL IJ SOAJ injection Inject 0.3 mg into the muscle as needed for anaphylaxis. 2 each 0   folic acid (FOLVITE) 1 MG tablet Take 1 tablet by mouth daily. 90 tablet 1   furosemide (LASIX) 20 MG tablet Take 2 tablets (40 mg total) by mouth 2 times daily. 360 tablet 0   Glucosamine HCl (GLUCOSAMINE PO) Take 2 tablets by mouth daily.     lidocaine-prilocaine (EMLA) cream Apply to port as needed. 30 g 0   LYSINE PO Take 1 tablet by mouth daily.     meclizine (ANTIVERT) 25 MG tablet Take 1 tablet (25 mg total) by mouth 3 (three) times daily as needed for dizziness. 30 tablet 0   Multiple Vitamin (MULTIVITAMIN WITH MINERALS) TABS tablet Take 1 tablet by mouth daily.     Multiple Vitamins-Minerals (AIRBORNE PO) Take 1 tablet  by mouth daily.      OLANZapine (ZYPREXA) 2.5 MG tablet TAKE 1 TABLET BY MOUTH AT BEDTIME 90 tablet 1   ondansetron (ZOFRAN) 4 MG tablet TAKE 1 TABLET(4 MG) BY MOUTH EVERY 8 HOURS AS NEEDED FOR NAUSEA OR VOMITING 40 tablet 2   OVER THE COUNTER MEDICATION Take 2 tablets by mouth daily. Beet root     oxyCODONE (OXY IR/ROXICODONE) 5 MG immediate release tablet TAKE 1 TO 2 TABLETS BY MOUTH EVERY 4 HOURS AS NEEDED FOR SEVERE PAIN 90 tablet 0   pantoprazole (PROTONIX) 40 MG tablet Take 1 tablet (40 mg  total) by mouth daily. 90 tablet 3   polyethylene glycol (MIRALAX / GLYCOLAX) 17 g packet Take 17 g by mouth every evening.     potassium chloride SA (KLOR-CON M) 20 MEQ tablet Take 2 tablets (40 mEq total) by mouth 2 (two) times daily. 360 tablet 0   Tetrahydrozoline HCl (VISINE OP) Place 1 drop into both eyes daily as needed (irritation).     VITAMIN D PO Take 1 capsule by mouth daily.     vitamin E 180 MG (400 UNITS) capsule Take 400 IU daily x 1 week, then 400 IU BID (Patient taking differently: Take 400 Units by mouth daily.) 60 capsule 4   XTAMPZA ER 13.5 MG C12A Take 1 capsule by mouth in the morning and at bedtime. (every 12 hours) 60 capsule 0   No current facility-administered medications on file prior to visit.    Allergies: No Known Allergies Past Medical History:  Past Medical History:  Diagnosis Date   Anemia    Anxiety    Concussion 09/28/2019   DVT (deep venous thrombosis) ([facility]) 2021   L leg   Dyspnea    GERD (gastroesophageal reflux disease)    Hypercholesterolemia    per pt, she does not have elevated lipids   Hypertension    met lung ca dx'd 09/2019   mets to spine, hip and brain   PONV (postoperative nausea and vomiting)    Tobacco abuse    Past Surgical History:  Past Surgical History:  Procedure Laterality Date   ABDOMINAL HYSTERECTOMY     partial/ left ovaries   APPLICATION OF CRANIAL NAVIGATION N/A 12/02/2020   Procedure: APPLICATION OF CRANIAL NAVIGATION;  Surgeon: Erline Levine, MD;  Location: [facility];  Service: Neurosurgery;  Laterality: N/A;   CHOLECYSTECTOMY     CRANIOTOMY N/A 12/02/2020   Procedure: Posterior fossa craniotomy for tumor resection with brainlab;  Surgeon: Erline Levine, MD;  Location: [facility];  Service: Neurosurgery;  Laterality: N/A;   DILATION AND CURETTAGE OF UTERUS     IR IMAGING GUIDED PORT INSERTION  10/23/2019   IR PATIENT EVAL TECH 0-60 MINS  11/21/2021   IR PATIENT EVAL TECH 0-60 MINS  11/24/2021   IR PATIENT EVAL TECH 0-60 MINS   11/29/2021   IR PATIENT EVAL TECH 0-60 MINS  12/04/2021   IR PATIENT EVAL TECH 0-60 MINS  12/12/2021   IR PATIENT EVAL TECH 0-60 MINS  12/19/2021   IR PATIENT EVAL TECH 0-60 MINS  12/27/2021   IR PATIENT EVAL TECH 0-60 MINS  01/03/2022   IR PATIENT EVAL TECH 0-60 MINS  01/10/2022   IR PATIENT EVAL TECH 0-60 MINS  01/18/2022   IR PATIENT EVAL TECH 0-60 MINS  02/13/2022   IR PATIENT EVAL TECH 0-60 MINS  02/20/2022   IR RADIOLOGIST EVAL & MGMT  01/23/2022   IR REMOVAL TUN ACCESS W/ PORT W/O FL  MOD SED  11/17/2021   KYPHOPLASTY N/A 03/15/2020   Procedure: Thoracic Eight KYPHOPLASTY;  Surgeon: Erline Levine, MD;  Location: [facility];  Service: Neurosurgery;  Laterality: N/A;  prone    LIPOMA EXCISION  2018   removed under left breast and right thigh.   TUBAL LIGATION     Social History:  Social History   Socioeconomic History   Marital status: Married    Spouse name: Not on file   Number of children: 3   Years of education: Not on file   Highest education level: Not on file  Occupational History   Occupation: owns Academic librarian co    Employer: [employer]  Tobacco Use   Smoking status: Former    Packs/day: 0.25    Types: Cigarettes    Quit date: 10/31/2012    Years since quitting: 9.9   Smokeless tobacco: Never  Vaping Use   Vaping Use: Never used  Substance and Sexual Activity   Alcohol use: Yes    Alcohol/week: 0.0 standard drinks of alcohol    Comment: rare   Drug use: No   Sexual activity: Yes  Other Topics Concern   Not on file  Social History Narrative   Not on file   Social Determinants of Health   Financial Resource Strain: Not on file  Food Insecurity: Not on file  Transportation Needs: Not on file  Physical Activity: Not on file  Stress: Not on file  Social Connections: Not on file  Intimate Partner Violence: Not on file   Family History:  Family History  Problem Relation Age of Onset   Heart disease Father    Drug abuse Daughter    Drug abuse Son    Cancer Sister     Heart disease Brother    Heart attack Brother     Review of Systems: Constitutional: Doesn't report fevers, chills or abnormal weight loss Eyes: Doesn't report blurriness of vision Ears, nose, mouth, throat, and face: Doesn't report sore throat Respiratory: Doesn't report cough, dyspnea or wheezes Cardiovascular: Doesn't report palpitation, chest discomfort  Gastrointestinal:  Doesn't report nausea, constipation, diarrhea GU: Doesn't report incontinence Skin: Doesn't report skin rashes Neurological: Per HPI Musculoskeletal: Doesn't report joint pain Behavioral/Psych: Doesn't report anxiety  Physical Exam: Wt Readings from Last 3 Encounters:  09/27/22 143 lb 9.6 oz (65.1 kg)  09/06/22 140 lb 11.2 oz (63.8 kg)  08/13/22 141 lb 14.4 oz (64.4 kg)   Temp Readings from Last 3 Encounters:  09/27/22 98.1 F (36.7 C)  09/06/22 97.6 F (36.4 C) (Oral)  08/13/22 97.6 F (36.4 C) (Oral)   BP Readings from Last 3 Encounters:  09/27/22 (!) 132/93  09/06/22 117/71  09/06/22 (!) 138/102   Pulse Readings from Last 3 Encounters:  09/27/22 94  09/27/22 (!) 101  09/06/22 92    KPS: 60. General: Wheelchair bound  Head: Normal EENT: No conjunctival injection or scleral icterus.  Lungs: Resp effort normal Cardiac: Regular rate Abdomen: Non-distended abdomen Skin: No rashes cyanosis or petechiae. Extremities: No clubbing or edema  Neurologic Exam: Mental Status: Awake, alert, attentive to examiner. Oriented to self and environment. Language is fluent with intact comprehension.  Cranial Nerves: Visual acuity is grossly normal. Noted dysarthria. Visual fields are full. Extra-ocular movements intact. No ptosis. Face is symmetric Motor: Tone and bulk are normal. Power is full in both arms and legs. Reflexes are symmetric, no pathologic reflexes present.  Dense dysmetria, coordination impaired bilaterally. Sensory: Intact to light touch Gait: Non ambulatory  today  Labs: I have reviewed  the data as listed    Component Value Date/Time   NA 140 09/27/2022 1338   K 3.7 09/27/2022 1338   CL 103 09/27/2022 1338   CO2 30 09/27/2022 1338   GLUCOSE 93 09/27/2022 1338   BUN 11 09/27/2022 1338   CREATININE 0.67 09/27/2022 1338   CREATININE 0.58 11/15/2017 0948   CALCIUM 9.3 09/27/2022 1338   PROT 6.8 09/27/2022 1338   ALBUMIN 3.3 (L) 09/27/2022 1338   AST 20 09/27/2022 1338   ALT 11 09/27/2022 1338   ALKPHOS 57 09/27/2022 1338   BILITOT 0.4 09/27/2022 1338   GFRNONAA >60 09/27/2022 1338   GFRNONAA 100 11/15/2017 0948   GFRAA >60 06/09/2020 1051   GFRAA 116 11/15/2017 0948   Lab Results  Component Value Date   WBC 8.2 09/27/2022   NEUTROABS 4.5 09/27/2022   HGB 13.2 09/27/2022   HCT 38.1 09/27/2022   MCV 96.7 09/27/2022   PLT 408 (H) 09/27/2022    Imaging:  [facility] Clinician Interpretation: I have personally reviewed the CNS images as listed.  My interpretation, in the context of the patient's clinical presentation, is  stable findings, some question of bilateral subdural collection of unclear etiology.  MR BRAIN W WO CONTRAST  Addendum Date: 10/12/2022   ADDENDUM REPORT: 10/12/2022 10:22 ADDENDUM: Initially included in the body but omitted from the impression: The small bilateral subdural collections are slightly decreased in size since 08/10/2022. Electronically Signed   By: Valetta Mole M.D.   On: 10/12/2022 10:22   Result Date: 10/12/2022 CLINICAL DATA:  Metastatic lung cancer.  Radiation and chemotherapy. EXAM: MRI HEAD WITHOUT AND WITH CONTRAST TECHNIQUE: Multiplanar, multiecho pulse sequences of the brain and surrounding structures were obtained without and with intravenous contrast. CONTRAST:  7 cc [facility] COMPARISON:  Brain MRI 08/10/2022 FINDINGS: Brain: Bilateral subdural collections with associated dural enhancement are again seen measuring up to 2 mm on the right and 1 mm on the left, decreased in size since 08/10/2022. There is no evidence of new acute  intracranial hemorrhage. Left suboccipital craniotomy for mass resection is again seen. Patchy enhancement in the bilateral cerebellar hemispheres is overall not significantly changed since 08/10/2022. There is no new, progressive, or masslike enhancement. FLAIR signal abnormality in the cerebellar hemispheres, left more than right, is also not significantly changed. Elevated DWI signal anteriorly in the left cerebellar hemisphere approaching the middle cerebellar peduncle is unchanged (3-58). Ill-defined curvilinear enhancement in the right parietal lobe measuring up to 6 mm in the axial plane is unchanged (13-98). There is no progressive FLAIR signal abnormality in this location. The punctate focus of enhancement in the right parieto-occipital region is unchanged (13-85). The 2-3 mm lesion in the right occipital lobe is unchanged (13-65). There are no new lesions. Parenchymal volume is stable. The ventricles are stable in size. Background chronic small-vessel ischemic change is stable. There is no midline shift. Vascular: Normal flow voids. Skull and upper cervical spine: Left suboccipital craniotomy as above. There is no suspicious marrow signal abnormality. Sinuses/Orbits: The paranasal sinuses are clear. The globes and orbits are unremarkable. A small left mastoid effusion is unchnagedd. Other: None IMPRESSION: Stable exam since 08/10/2022 with unchanged patchy enhancement in the bilateral cerebellar hemispheres and unchanged right parietal and occipital lesions. No new lesions or worsening FLAIR signal abnormality. Electronically Signed: By: Valetta Mole M.D. On: 10/12/2022 09:36   CT CHEST ABDOMEN PELVIS W CONTRAST  Result Date: 10/10/2022 CLINICAL DATA:  Metastatic disease  from non-small-cell lung cancer. Assess treatment response. Status post chemotherapy * Tracking Code: BO * EXAM: CT CHEST, ABDOMEN, AND PELVIS WITH CONTRAST TECHNIQUE: Multidetector CT imaging of the chest, abdomen and pelvis was  performed following the standard protocol during bolus administration of intravenous contrast. RADIATION DOSE REDUCTION: This exam was performed according to the departmental dose-optimization program which includes automated exposure control, adjustment of the mA and/or kV according to patient size and/or use of iterative reconstruction technique. CONTRAST:  179mL OMNIPAQUE IOHEXOL 300 MG/ML  SOLN COMPARISON:  07/05/2022 and older FINDINGS: CT CHEST FINDINGS Cardiovascular: Small to moderate pericardial effusion is stable. The heart is slightly enlarged. Coronary artery calcifications are seen. The thoracic aorta has some scattered partially calcified atherosclerotic plaque. Plaque also extends along the origin of the great vessels. Diameter of the ascending aorta is measured at 3.6 by 3.7 cm, not significantly changed when adjusting for technique. Mediastinum/Nodes: No specific abnormal lymph node enlargement seen in the axillary region, hilum. There is edema seen along the mediastinum as on the previous examination. Small paratracheal nodes are identified which are less than 1 cm in size and not pathologic by size criteria but more numerous than usually seen. Once again there is some wall thickening along the esophagus and lumen is slightly patulous, similar to prior. Very small hiatal hernia identified. Lungs/Pleura: Once again in the right lower lobe is some volume loss, thickening interstitial change as well as some bandlike areas of opacity. There is opacity along bronchi as well particularly inferolateral and posteromedial in the right lower lobe with a similar distribution to the previous examination. Multiple scattered ground-glass nodular areas identified in both lungs with some punctate more confluent nodules. Example right upper lobe on series 6, image 69, similar to previous. Additional areas left upper lobe. Previous area was measured at 6 mm in AP diameter in the left upper lobe and today this same  area when measured in the same fashion on image 54 of series 6 would be 6 mm as well. Area just caudal and medial to this which previously measured 6 mm as well as also stable on image 58 when adjusting for technique at 5 mm when measured in same fashion. Small areas as well in the left lower lobe superior segment and, more caudal as well. Few punctate nodule seen such as lingula on image 91 measuring 2-3 mm is also stable. No new dominant mass in the lungs. No pleural effusion or pneumothorax. Musculoskeletal: Scattered degenerative changes are noted including spine and shoulders. Once again there is augmentation cement seen within the T8 vertebral body with some associated bony sclerosis. There is also areas of sclerosis involving the L2 vertebral body, T10. Distribution appears similar. If there is concern however of osseous metastatic disease, bone scan could be considered as clinically indicated. Sclerotic lesion as well involving the right posterior iliac bone as previously seen. CT ABDOMEN PELVIS FINDINGS Hepatobiliary: Preserved hepatic parenchyma. Minimal prominence of the biliary tree is stable from previous. The patient is status post cholecystectomy. Only slight area of increased ectasia of the biliary tree along the margin of the left hepatic lobe in segment 2 on series 2 image 53. Nonspecific. Patent portal vein. No enhancing liver mass. Pancreas: Global atrophy of the pancreas without duct dilatation or mass. Spleen: Spleen is nonenlarged.  No enhancing lesion. Adrenals/Urinary Tract: Adrenal glands are stable. No enhancing renal mass or collecting system dilatation. Ureters have normal course and caliber down to the bladder. Preserved contours of the urinary  bladder. Stomach/Bowel: Large bowel has a redundant course to the sigmoid colon. Moderate scattered colonic stool. Areas of underdistention in the area of the flexures with some asymmetric wall thickening but likely again related to the level  distention based on the overall appearance and other areas. Normal appendix extends medial to the cecum in the right hemipelvis. The stomach and small bowel are nondilated. Small bowel is nondilated. Vascular/Lymphatic: Normal caliber aorta and IVC with some scattered vascular calcifications. No specific abnormal lymph node enlargement seen in the abdomen and pelvis. Reproductive: Status post hysterectomy. No adnexal masses. Other: Mild anasarca.  No ascites. Musculoskeletal: Curvature and degenerative changes along the spine. Once again there are some scattered skeletal sclerotic metastases including the pelvis, lumbar spine and thoracic spine. If needed whole-body bone scan could be considered as clinically appropriate IMPRESSION: Overall stable appearance with bandlike opacity involving the right lower lobe extending to the hilum. Scattered small ground-glass type nodules in both lungs. Stable pericardial effusion. No bowel obstruction, free air or free fluid. Scattered skeletal sclerotic metastases. Once again there is augmentation cement at T8 Electronically Signed   By: Jill Side M.D.   On: 10/10/2022 15:39     Assessment/Plan Metastasis to brain [facility])  Radiation therapy induced brain necrosis   Ms. Draughn is clinically stable today.  MRI brain also demonstrates stable findings.  Will con't to follow with [facility] for systemic therapy.    For vertigo, can con't meclizine dose 50mg  for PRN TID if helpful.  Also discussed referral to neuro-PT for vestibular rehab; she is agreeable with this.  We appreciate the opportunity to participate in the care of Virginia [name].    We ask that Virginia [name] return to clinic in 4 months following next brain MRI, or sooner as needed.  All questions were answered. The patient knows to call the clinic with any problems, questions or concerns. No barriers to learning were detected.  The total time spent in the encounter was 30 minutes and more than  50% was on counseling and review of test results   Ventura Sellers, MD Medical Director of Neuro-Oncology [facility] 10/15/22 11:45 AM

## 2022-10-16 ENCOUNTER — Other Ambulatory Visit: Payer: Self-pay | Admitting: Hematology and Oncology

## 2022-10-16 ENCOUNTER — Other Ambulatory Visit: Payer: Self-pay | Admitting: Radiation Therapy

## 2022-10-16 MED ORDER — XTAMPZA ER 13.5 MG PO C12A: 1.0000 | EXTENDED_RELEASE_CAPSULE | Freq: Two times a day (BID) | ORAL | 0 refills | Status: DC

## 2022-10-18 ENCOUNTER — Other Ambulatory Visit: Payer: Self-pay | Admitting: Hematology and Oncology

## 2022-10-19 ENCOUNTER — Inpatient Hospital Stay (HOSPITAL_BASED_OUTPATIENT_CLINIC_OR_DEPARTMENT_OTHER): Payer: Medicare Other | Admitting: Hematology and Oncology

## 2022-10-19 ENCOUNTER — Inpatient Hospital Stay: Payer: No Typology Code available for payment source

## 2022-10-19 VITALS — BP 120/83 | HR 93 | Resp 17

## 2022-10-19 DIAGNOSIS — C7931 Secondary malignant neoplasm of brain: Secondary | ICD-10-CM

## 2022-10-19 DIAGNOSIS — R918 Other nonspecific abnormal finding of lung field: Secondary | ICD-10-CM | POA: Diagnosis not present

## 2022-10-19 DIAGNOSIS — C3431 Malignant neoplasm of lower lobe, right bronchus or lung: Secondary | ICD-10-CM | POA: Diagnosis not present

## 2022-10-19 LAB — CMP (CANCER CENTER ONLY)
ALT: 12 U/L (ref 0–44)
AST: 21 U/L (ref 15–41)
Albumin: 3.5 g/dL (ref 3.5–5.0)
Alkaline Phosphatase: 61 U/L (ref 38–126)
Anion gap: 9 (ref 5–15)
BUN: 8 mg/dL (ref 8–23)
CO2: 29 mmol/L (ref 22–32)
Calcium: 9.4 mg/dL (ref 8.9–10.3)
Chloride: 101 mmol/L (ref 98–111)
Creatinine: 0.72 mg/dL (ref 0.44–1.00)
GFR, Estimated: >60 mL/min (ref >60)
Glucose, Bld: 104 mg/dL — ABNORMAL HIGH (ref 70–99)
Potassium: 3.3 mmol/L — ABNORMAL LOW (ref 3.5–5.1)
Sodium: 139 mmol/L (ref 135–145)
Total Bilirubin: 0.5 mg/dL (ref 0.3–1.2)
Total Protein: 7.3 g/dL (ref 6.5–8.1)

## 2022-10-19 LAB — CBC WITH DIFFERENTIAL (CANCER CENTER ONLY)
Abs Immature Granulocytes: 0.02 K/uL (ref 0.00–0.07)
Basophils Absolute: 0.1 K/uL (ref 0.0–0.1)
Basophils Relative: 1 %
Eosinophils Absolute: 0.2 K/uL (ref 0.0–0.5)
Eosinophils Relative: 3 %
HCT: 40.9 % (ref 36.0–46.0)
Hemoglobin: 14.4 g/dL (ref 12.0–15.0)
Immature Granulocytes: 0 %
Lymphocytes Relative: 36 %
Lymphs Abs: 2.3 K/uL (ref 0.7–4.0)
MCH: 33.6 pg (ref 26.0–34.0)
MCHC: 35.2 g/dL (ref 30.0–36.0)
MCV: 95.3 fL (ref 80.0–100.0)
Monocytes Absolute: 0.8 K/uL (ref 0.1–1.0)
Monocytes Relative: 13 %
Neutro Abs: 2.9 K/uL (ref 1.7–7.7)
Neutrophils Relative %: 47 %
Platelet Count: 402 K/uL — ABNORMAL HIGH (ref 150–400)
RBC: 4.29 MIL/uL (ref 3.87–5.11)
RDW: 13.2 % (ref 11.5–15.5)
WBC Count: 6.3 K/uL (ref 4.0–10.5)
nRBC: 0 % (ref 0.0–0.2)

## 2022-10-19 LAB — TSH: TSH: 3.327 u[IU]/mL (ref 0.350–4.500)

## 2022-10-19 MED ORDER — SODIUM CHLORIDE 0.9 % IV SOLN
500.0000 mg/m2 | Freq: Once | INTRAVENOUS | Status: AC
  Administered 2022-10-19: 900 mg via INTRAVENOUS
  Filled 2022-10-19: qty 20

## 2022-10-19 MED ORDER — SODIUM CHLORIDE 0.9 % IV SOLN
200.0000 mg | Freq: Once | INTRAVENOUS | Status: AC
  Administered 2022-10-19: 200 mg via INTRAVENOUS
  Filled 2022-10-19: qty 8

## 2022-10-19 MED ORDER — SODIUM CHLORIDE 0.9 % IV SOLN: Freq: Once | INTRAVENOUS | Status: AC

## 2022-10-19 MED ORDER — PROCHLORPERAZINE MALEATE 10 MG PO TABS
10.0000 mg | ORAL_TABLET | Freq: Once | ORAL | Status: AC
  Administered 2022-10-19: 10 mg via ORAL
  Filled 2022-10-19: qty 1

## 2022-10-19 NOTE — Patient Instructions (Signed)
[facility]   Discharge Instructions: Thank you for choosing [facility] to provide your oncology and hematology care.   If you have a lab appointment with the [facility], please go directly to the [facility] and check in at the registration area.   Wear comfortable clothing and clothing appropriate for easy access to any Portacath or PICC line.   We strive to give you quality time with your provider. You may need to reschedule your appointment if you arrive late (15 or more minutes).  Arriving late affects you and other patients whose appointments are after yours.  Also, if you miss three or more appointments without notifying the office, you may be dismissed from the clinic at the provider's discretion.      For prescription refill requests, have your pharmacy contact our office and allow 72 hours for refills to be completed.    Today you received the following chemotherapy and/or immunotherapy agents: Pembrolizumab (Keytruda) and Pemetrexed (Alimta)      To help prevent nausea and vomiting after your treatment, we encourage you to take your nausea medication as directed.  BELOW ARE SYMPTOMS THAT SHOULD BE REPORTED IMMEDIATELY: *FEVER GREATER THAN 100.4 F (38 C) OR HIGHER *CHILLS OR SWEATING *NAUSEA AND VOMITING THAT IS NOT CONTROLLED WITH YOUR NAUSEA MEDICATION *UNUSUAL SHORTNESS OF BREATH *UNUSUAL BRUISING OR BLEEDING *URINARY PROBLEMS (pain or burning when urinating, or frequent urination) *BOWEL PROBLEMS (unusual diarrhea, constipation, pain near the anus) TENDERNESS IN MOUTH AND THROAT WITH OR WITHOUT PRESENCE OF ULCERS (sore throat, sores in mouth, or a toothache) UNUSUAL RASH, SWELLING OR PAIN  UNUSUAL VAGINAL DISCHARGE OR ITCHING   Items with * indicate a potential emergency and should be followed up as soon as possible or go to the Emergency Department if any problems should occur.  Please show the CHEMOTHERAPY ALERT  CARD or IMMUNOTHERAPY ALERT CARD at check-in to the Emergency Department and triage nurse.  Should you have questions after your visit or need to cancel or reschedule your appointment, please contact [facility]  Dept: [phone]  and follow the prompts.  Office hours are 8:00 a.m. to 4:30 p.m. Monday - Friday. Please note that voicemails left after 4:00 p.m. may not be returned until the following business day.  We are closed weekends and major holidays. You have access to a nurse at all times for urgent questions. Please call the main number to the clinic Dept: [phone] and follow the prompts.   For any non-urgent questions, you may also contact your provider using MyChart. We now offer e-Visits for anyone 32 and older to request care online for non-urgent symptoms. For details visit [email or web address].   Also download the MyChart app! Go to the app store, search "MyChart", open the app, select [facility], and log in with your MyChart username and password.

## 2022-10-19 NOTE — Progress Notes (Signed)
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 CT of the head showed showed concerning for metastatic disease involving the left cerebellum and right occipital lobe.  2) 09/30/2019: MRI brain confirms mass within the left cerebellum measures 1.6 x 1.3 x 1.5 cm as well as at least 8 metastatic lesions within the supratentorial and infratentorial brain as outlined 3) 10/01/2019: PET CT scan revealed hypermetabolic infrahilar right lower lobe mass with hypermetabolic nodal metastases in the right hilum, mediastinum, right supraclavicular region, left axilla and lower left neck. 4) 10/08/2019: US guided biopsy of the lymph nodes confirms poorly differentiated non-small cell carcinoma. Immunohistochemistry confirms adenocarcinoma of lung primary. PD-L1 and NGS later revealed a MET Exon 14 mutation and TPS of 90%.  5) 10/12/2019: Establish care with Dr. Lorenso Courier  6) 11/04/2019: Day 1 of capmatinib 427m BID 7) 11/09/2019: presented to Rad/Onc with a DVT in LLE. Seen in symptom management clinic and started on apixaban.  8) 12/29/2019: CT C/A/P showed interval decrease in size of mass involving the superior segment of right lower lobe and reduction in mediastinal and left supraclavicular adenopathy though some small spinal lesions were noted in the interim between the two sets of scans 9) 01/25/2020: temporarily stopped capmatinib due to symptoms of nausea/fatigue. Restarted therapy on 01/30/2020 after brief chemo holiday.  10)  03/22/2020: CT C/A/P reveals a mixed picture, with new lung nodule and increased lymphadenopathy, however response in bone lesions. D/c capmatinib therapy 11) 04/08/2020: start of Carbo/Pem/Pem for 2nd line treatment. Cycle 1 Day 1   12) 04/29/2020: Cycle 2 Day 1 Carbo/Pem/Pem  13) 05/20/2020: Cycle 3 Day 1 Carbo/Pem/Pem  14) 06/09/2020: Cycle 4 Day 1 Carbo/Pem/Pem  15) 06/29/2020: Cycle 5 Day 1 maintenance Pem/Pem  16) 07/20/2020: Cycle 6 Day 1 maintenance Pem/Pem  17) 08/12/2020: Cycle 7 Day 1 maintenance Pem/Pem  18) 09/01/2020: Cycle 8 Day 1 maintenance Pem/Pem  19) 09/21/2020: Cycle 9 Day 1 maintenance Pem/Pem  20) 10/13/2020: Cycle 10 Day 1 maintenance Pem/Pem  21) 11/03/2020: Cycle 11 Day 1 maintenance Pem/Pem  22) 11/25/2020:  Cycle 12 Day 1 maintenance Pem/Pem 23) 12/15/2020:  Cycle 13 Day 1 maintenance Pem/Pem 24) 01/05/2021:  Cycle 14 Day 1 maintenance Pem/Pem 25) 01/26/2021:  Cycle 15 Day 1 maintenance Pem/Pem plus Bevacizumab.  02/17/2021: Cycle 16 Day 1 maintenance Pem/Pem plus Bevacizumab.  03/10/2021: Cycle 17 Day 1 maintenance Pem/Pem plus Bevacizumab.  03/31/2021: Cycle 18 Day 1 maintenance Pem/Pem  04/20/2021:  Cycle 19 Day 1 maintenance Pem/Pem  05/12/2021: Cycle 20 Day 1 maintenance Pem/Pem 06/01/2021: Cycle 21 Day 1 maintenance Pem/Pem 06/22/2021: Cycle 22 Day 1 maintenance Pem/Pem 07/13/2021: Cycle 23 Day 1 maintenance Pem/Pem 08/16/2021: Cycle 24 Day 1 maintenance Pem/Pem 09/08/2021: Cycle 25 Day 1 maintenance Pem/Pem plus Bevacizumab 09/28/2021: Cycle 26 Day 1 maintenance Pem/Pem plus Bevacizumab 10/20/2021: Cycle 27 Day 1 maintenance Pem/Pem plus Bevacizumab 11/09/2021: Cycle 28 Day 1 maintenance Pem/Pem  12/06/2021: Cycle 29 Day 1 maintenance Pem/Pem  12/27/2021: Cycle 30 Day 1 maintenance Pem/Pem  01/18/2022: Cycle 31 Day 1 maintenance Pem/Pem  02/07/2022: Cycle 32 Day  1 maintenance Pem/Pem  02/28/2022: Cycle 33 Day 1 maintenance Pem/Pem  03/23/2022: Cycle 34 Day 1 maintenance Pem/Pem   04/13/2022: Cycle 35 Day 1 maintenance Pem/Pem  05/04/2022: Cycle 36 Day 1 maintenance Pem/Pem  05/25/2022: Cycle 37 Day 1 maintenance Pem/Pem  06/14/2022: Cycle 38 Day 1 maintenance Pem/Pem 07/05/2022: Cycle 39 Day 1 maintenance Pem/Pem 08/13/2022: Cycle 40 Day 1 maintenance Pem/Pem 09/06/2022:Cycle 41 Day 1 maintenance Pem/Pem 09/27/2022: Cycle 42 Day 1 maintenance Pem/Pem 10/19/2022: Cycle 43 Day 1 maintenance Pem/Pem  Interval History:  Virginia [name] 67 y.o. female with medical history significant for metastatic adenocarcinoma of the lung presents for a follow up visit. The patient's last visit was on 09/27/2022.  In the interim since the last visit she has continued on maintenance Pem/Pem.   On exam today Virginia [name] reports she has been well overall in the interim since her last visit.  Dizziness continues to be her primary symptom.  She reports improved swelling in her legs and has tried to be more ambulatory.  She reports that her appetite has been good and overall she feels quite well.  She and her husband are delighted to hear the results of the most recent MRI and CT scan showing stable disease.  She otherwise denies any changes in her health.  She denies any fevers, chills, sweats, vomiting, diarrhea.  Full 10 point ROS was otherwise negative  MEDICAL HISTORY:  Past Medical History:  Diagnosis Date   Anemia    Anxiety    Concussion 09/28/2019   DVT (deep venous thrombosis) ([place]) 2021   L leg   Dyspnea    GERD (gastroesophageal reflux disease)    Hypercholesterolemia    per pt, she does not have elevated lipids   Hypertension    met lung ca dx'd 09/2019   mets to spine, hip and brain   PONV (postoperative nausea and vomiting)    Tobacco abuse     SURGICAL HISTORY: Past Surgical History:  Procedure Laterality Date   ABDOMINAL HYSTERECTOMY     partial/ left ovaries   APPLICATION OF CRANIAL NAVIGATION N/A 12/02/2020   Procedure: APPLICATION OF CRANIAL NAVIGATION;  Surgeon:  Erline Levine, MD;  Location: [facility];  Service: Neurosurgery;  Laterality: N/A;   CHOLECYSTECTOMY     CRANIOTOMY N/A 12/02/2020   Procedure: Posterior fossa craniotomy for tumor resection with brainlab;  Surgeon: Erline Levine, MD;  Location: [facility];  Service: Neurosurgery;  Laterality: N/A;   DILATION AND CURETTAGE OF UTERUS     IR IMAGING GUIDED PORT INSERTION  10/23/2019   IR PATIENT EVAL TECH 0-60 MINS  11/21/2021   IR PATIENT EVAL TECH 0-60 MINS  11/24/2021   IR PATIENT EVAL TECH 0-60 MINS  11/29/2021   IR PATIENT EVAL TECH 0-60 MINS  12/04/2021   IR PATIENT EVAL TECH 0-60 MINS  12/12/2021   IR PATIENT EVAL TECH 0-60 MINS  12/19/2021   IR PATIENT EVAL TECH 0-60 MINS  12/27/2021   IR PATIENT EVAL TECH 0-60 MINS  01/03/2022   IR PATIENT EVAL TECH 0-60 MINS  01/10/2022   IR PATIENT EVAL TECH 0-60 MINS  01/18/2022   IR PATIENT EVAL TECH 0-60 MINS  02/13/2022   IR PATIENT EVAL TECH 0-60 MINS  02/20/2022   IR RADIOLOGIST EVAL & MGMT  01/23/2022   IR REMOVAL TUN ACCESS W/ PORT W/O FL MOD SED  11/17/2021   KYPHOPLASTY N/A 03/15/2020   Procedure: Thoracic Eight KYPHOPLASTY;  Surgeon: Erline Levine, MD;  Location: [facility]  OR;  Service: Neurosurgery;  Laterality: N/A;  prone    LIPOMA EXCISION  2018   removed under left breast and right thigh.   TUBAL LIGATION      ALLERGIES:  has No Known Allergies.  MEDICATIONS:  Current Outpatient Medications  Medication Sig Dispense Refill   acetaminophen (TYLENOL) 500 MG tablet Take 500 mg by mouth 2 (two) times daily as needed for moderate pain.     albuterol (VENTOLIN HFA) 108 (90 Base) MCG/ACT inhaler INHALE 2 PUFFS INTO THE LUNGS EVERY 6 HOURS AS NEEDED FOR WHEEZING OR SHORTNESS OF BREATH 6.7 g 11   ALPRAZolam (XANAX) 0.25 MG tablet Take 1 tablet (0.25 mg total) by mouth 2 (two) times daily as needed for anxiety 60 tablet 0   amLODipine (NORVASC) 10 MG tablet Take 1 tablet (10 mg total) by mouth daily. 90 tablet 3   Cyanocobalamin (VITAMIN B12 PO) Take 1 tablet by mouth  daily. Gummies     EPINEPHrine 0.3 mg/0.3 mL IJ SOAJ injection Inject 0.3 mg into the muscle as needed for anaphylaxis. 2 each 0   folic acid (FOLVITE) 1 MG tablet TAKE 1 TABLET BY MOUTH DAILY 90 tablet 1   furosemide (LASIX) 20 MG tablet Take 2 tablets (40 mg total) by mouth 2 times daily. 360 tablet 0   Glucosamine HCl (GLUCOSAMINE PO) Take 2 tablets by mouth daily.     lidocaine-prilocaine (EMLA) cream Apply to port as needed. 30 g 0   LYSINE PO Take 1 tablet by mouth daily.     meclizine (ANTIVERT) 25 MG tablet Take 1 tablet (25 mg total) by mouth 3 (three) times daily as needed for dizziness. 30 tablet 0   Multiple Vitamin (MULTIVITAMIN WITH MINERALS) TABS tablet Take 1 tablet by mouth daily.     Multiple Vitamins-Minerals (AIRBORNE PO) Take 1 tablet by mouth daily.      OLANZapine (ZYPREXA) 2.5 MG tablet TAKE 1 TABLET BY MOUTH AT BEDTIME 90 tablet 1   ondansetron (ZOFRAN) 4 MG tablet TAKE 1 TABLET(4 MG) BY MOUTH EVERY 8 HOURS AS NEEDED FOR NAUSEA OR VOMITING 40 tablet 2   OVER THE COUNTER MEDICATION Take 2 tablets by mouth daily. Beet root     oxyCODONE (OXY IR/ROXICODONE) 5 MG immediate release tablet TAKE 1 TO 2 TABLETS BY MOUTH EVERY 4 HOURS AS NEEDED FOR SEVERE PAIN 90 tablet 0   pantoprazole (PROTONIX) 40 MG tablet Take 1 tablet (40 mg total) by mouth daily. 90 tablet 3   polyethylene glycol (MIRALAX / GLYCOLAX) 17 g packet Take 17 g by mouth every evening.     potassium chloride SA (KLOR-CON M) 20 MEQ tablet Take 2 tablets (40 mEq total) by mouth 2 (two) times daily. 360 tablet 0   Tetrahydrozoline HCl (VISINE OP) Place 1 drop into both eyes daily as needed (irritation).     VITAMIN D PO Take 1 capsule by mouth daily.     vitamin E 180 MG (400 UNITS) capsule Take 400 IU daily x 1 week, then 400 IU BID (Patient taking differently: Take 400 Units by mouth daily.) 60 capsule 4   XTAMPZA ER 13.5 MG C12A Take 1 capsule by mouth in the morning and at bedtime. (every 12 hours) 60 capsule 0    No current facility-administered medications for this visit.    REVIEW OF SYSTEMS:   Constitutional: ( - ) fevers, ( - )  chills , ( - ) night sweats Eyes: ( - ) blurriness of vision, ( - )  double vision, ( - ) watery eyes Ears, nose, mouth, throat, and face: ( - ) mucositis, ( - ) sore throat Respiratory: ( - ) cough, ( + ) dyspnea, ( - ) wheezes Cardiovascular: ( - ) palpitation, ( - ) chest discomfort, ( - ) lower extremity swelling Gastrointestinal:  ( + ) nausea, ( - ) heartburn, ( - ) change in bowel habits Skin: ( - ) abnormal skin rashes Lymphatics: ( - ) new lymphadenopathy, ( - ) easy bruising Neurological: ( - ) numbness, ( - ) tingling, ( - ) new weaknesses Behavioral/Psych: ( - ) mood change, ( - ) new changes  All other systems were reviewed with the patient and are negative.  PHYSICAL EXAMINATION: ECOG PERFORMANCE STATUS: 1 - Symptomatic but completely ambulatory  Vitals:   10/19/22 0924  BP: (!) 130/93  Pulse: (!) 110  Resp: 16  Temp: 98.3 F (36.8 C)  SpO2: 98%        Filed Weights   10/19/22 0924  Weight: 139 lb 8 oz (63.3 kg)      GENERAL: well appearing middle aged Caucasian female in NAD  SKIN: skin color, texture, turgor are normal, no rashes or significant lesions EYES: conjunctiva are pink and non-injected, sclera clear LUNGS: wheezing at lung bases. clear to auscultation and percussion with normal breathing effort HEART: regular rate & rhythm and no murmurs and bilateral trace pitting lower extremity edema Musculoskeletal: no cyanosis of digits and no clubbing PSYCH: alert & oriented x 3, fluent speech NEURO: no focal motor/sensory deficits  LABORATORY DATA:  I have reviewed the data as listed    Latest Ref Rng & Units 10/19/2022    9:00 AM 09/27/2022    1:38 PM 09/06/2022   10:50 AM  CBC  WBC 4.0 - 10.5 K/uL 6.3  8.2  6.3   Hemoglobin 12.0 - 15.0 g/dL 14.4  13.2  13.4   Hematocrit 36.0 - 46.0 % 40.9  38.1  38.5   Platelets 150 -  400 K/uL 402  408  300        Latest Ref Rng & Units 10/19/2022    9:00 AM 09/27/2022    1:38 PM 09/06/2022   10:50 AM  CMP  Glucose 70 - 99 mg/dL 104  93  108   BUN 8 - 23 mg/dL 8  11  13    Creatinine 0.44 - 1.00 mg/dL 0.72  0.67  0.63   Sodium 135 - 145 mmol/L 139  140  139   Potassium 3.5 - 5.1 mmol/L 3.3  3.7  3.7   Chloride 98 - 111 mmol/L 101  103  104   CO2 22 - 32 mmol/L 29  30  28    Calcium 8.9 - 10.3 mg/dL 9.4  9.3  9.7   Total Protein 6.5 - 8.1 g/dL 7.3  6.8  7.2   Total Bilirubin 0.3 - 1.2 mg/dL 0.5  0.4  0.5   Alkaline Phos 38 - 126 U/L 61  57  64   AST 15 - 41 U/L 21  20  18    ALT 0 - 44 U/L 12  11  12       RADIOGRAPHIC STUDIES: I personally have viewed the radiographic studies below: MR BRAIN W WO CONTRAST  Addendum Date: 10/12/2022   ADDENDUM REPORT: 10/12/2022 10:22 ADDENDUM: Initially included in the body but omitted from the impression: The small bilateral subdural collections are slightly decreased in size since 08/10/2022. Electronically Signed   By:  Valetta Mole M.D.   On: 10/12/2022 10:22   Result Date: 10/12/2022 CLINICAL DATA:  Metastatic lung cancer.  Radiation and chemotherapy. EXAM: MRI HEAD WITHOUT AND WITH CONTRAST TECHNIQUE: Multiplanar, multiecho pulse sequences of the brain and surrounding structures were obtained without and with intravenous contrast. CONTRAST:  7 cc Vueway COMPARISON:  Brain MRI 08/10/2022 FINDINGS: Brain: Bilateral subdural collections with associated dural enhancement are again seen measuring up to 2 mm on the right and 1 mm on the left, decreased in size since 08/10/2022. There is no evidence of new acute intracranial hemorrhage. Left suboccipital craniotomy for mass resection is again seen. Patchy enhancement in the bilateral cerebellar hemispheres is overall not significantly changed since 08/10/2022. There is no new, progressive, or masslike enhancement. FLAIR signal abnormality in the cerebellar hemispheres, left more than right, is  also not significantly changed. Elevated DWI signal anteriorly in the left cerebellar hemisphere approaching the middle cerebellar peduncle is unchanged (3-58). Ill-defined curvilinear enhancement in the right parietal lobe measuring up to 6 mm in the axial plane is unchanged (13-98). There is no progressive FLAIR signal abnormality in this location. The punctate focus of enhancement in the right parieto-occipital region is unchanged (13-85). The 2-3 mm lesion in the right occipital lobe is unchanged (13-65). There are no new lesions. Parenchymal volume is stable. The ventricles are stable in size. Background chronic small-vessel ischemic change is stable. There is no midline shift. Vascular: Normal flow voids. Skull and upper cervical spine: Left suboccipital craniotomy as above. There is no suspicious marrow signal abnormality. Sinuses/Orbits: The paranasal sinuses are clear. The globes and orbits are unremarkable. A small left mastoid effusion is unchnagedd. Other: None IMPRESSION: Stable exam since 08/10/2022 with unchanged patchy enhancement in the bilateral cerebellar hemispheres and unchanged right parietal and occipital lesions. No new lesions or worsening FLAIR signal abnormality. Electronically Signed: By: Valetta Mole M.D. On: 10/12/2022 09:36   CT CHEST ABDOMEN PELVIS W CONTRAST  Result Date: 10/10/2022 CLINICAL DATA:  Metastatic disease from non-small-cell lung cancer. Assess treatment response. Status post chemotherapy * Tracking Code: BO * EXAM: CT CHEST, ABDOMEN, AND PELVIS WITH CONTRAST TECHNIQUE: Multidetector CT imaging of the chest, abdomen and pelvis was performed following the standard protocol during bolus administration of intravenous contrast. RADIATION DOSE REDUCTION: This exam was performed according to the departmental dose-optimization program which includes automated exposure control, adjustment of the mA and/or kV according to patient size and/or use of iterative reconstruction  technique. CONTRAST:  131mL OMNIPAQUE IOHEXOL 300 MG/ML  SOLN COMPARISON:  07/05/2022 and older FINDINGS: CT CHEST FINDINGS Cardiovascular: Small to moderate pericardial effusion is stable. The heart is slightly enlarged. Coronary artery calcifications are seen. The thoracic aorta has some scattered partially calcified atherosclerotic plaque. Plaque also extends along the origin of the great vessels. Diameter of the ascending aorta is measured at 3.6 by 3.7 cm, not significantly changed when adjusting for technique. Mediastinum/Nodes: No specific abnormal lymph node enlargement seen in the axillary region, hilum. There is edema seen along the mediastinum as on the previous examination. Small paratracheal nodes are identified which are less than 1 cm in size and not pathologic by size criteria but more numerous than usually seen. Once again there is some wall thickening along the esophagus and lumen is slightly patulous, similar to prior. Very small hiatal hernia identified. Lungs/Pleura: Once again in the right lower lobe is some volume loss, thickening interstitial change as well as some bandlike areas of opacity. There is opacity along bronchi  as well particularly inferolateral and posteromedial in the right lower lobe with a similar distribution to the previous examination. Multiple scattered ground-glass nodular areas identified in both lungs with some punctate more confluent nodules. Example right upper lobe on series 6, image 69, similar to previous. Additional areas left upper lobe. Previous area was measured at 6 mm in AP diameter in the left upper lobe and today this same area when measured in the same fashion on image 54 of series 6 would be 6 mm as well. Area just caudal and medial to this which previously measured 6 mm as well as also stable on image 58 when adjusting for technique at 5 mm when measured in same fashion. Small areas as well in the left lower lobe superior segment and, more caudal as well.  Few punctate nodule seen such as lingula on image 91 measuring 2-3 mm is also stable. No new dominant mass in the lungs. No pleural effusion or pneumothorax. Musculoskeletal: Scattered degenerative changes are noted including spine and shoulders. Once again there is augmentation cement seen within the T8 vertebral body with some associated bony sclerosis. There is also areas of sclerosis involving the L2 vertebral body, T10. Distribution appears similar. If there is concern however of osseous metastatic disease, bone scan could be considered as clinically indicated. Sclerotic lesion as well involving the right posterior iliac bone as previously seen. CT ABDOMEN PELVIS FINDINGS Hepatobiliary: Preserved hepatic parenchyma. Minimal prominence of the biliary tree is stable from previous. The patient is status post cholecystectomy. Only slight area of increased ectasia of the biliary tree along the margin of the left hepatic lobe in segment 2 on series 2 image 53. Nonspecific. Patent portal vein. No enhancing liver mass. Pancreas: Global atrophy of the pancreas without duct dilatation or mass. Spleen: Spleen is nonenlarged.  No enhancing lesion. Adrenals/Urinary Tract: Adrenal glands are stable. No enhancing renal mass or collecting system dilatation. Ureters have normal course and caliber down to the bladder. Preserved contours of the urinary bladder. Stomach/Bowel: Large bowel has a redundant course to the sigmoid colon. Moderate scattered colonic stool. Areas of underdistention in the area of the flexures with some asymmetric wall thickening but likely again related to the level distention based on the overall appearance and other areas. Normal appendix extends medial to the cecum in the right hemipelvis. The stomach and small bowel are nondilated. Small bowel is nondilated. Vascular/Lymphatic: Normal caliber aorta and IVC with some scattered vascular calcifications. No specific abnormal lymph node enlargement seen in  the abdomen and pelvis. Reproductive: Status post hysterectomy. No adnexal masses. Other: Mild anasarca.  No ascites. Musculoskeletal: Curvature and degenerative changes along the spine. Once again there are some scattered skeletal sclerotic metastases including the pelvis, lumbar spine and thoracic spine. If needed whole-body bone scan could be considered as clinically appropriate IMPRESSION: Overall stable appearance with bandlike opacity involving the right lower lobe extending to the hilum. Scattered small ground-glass type nodules in both lungs. Stable pericardial effusion. No bowel obstruction, free air or free fluid. Scattered skeletal sclerotic metastases. Once again there is augmentation cement at T8 Electronically Signed   By: Jill Side M.D.   On: 10/10/2022 15:39     ASSESSMENT & PLAN MEGA KINKADE 67 y.o. female with medical history significant for metastatic adenocarcinoma of the lung presents for a follow up visit. 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   Previously we discussed Carboplatin/Pembrolizumab/Pemetrexed and the expected  side effect from these medications.  We discussed the possible side effects of immunotherapy including colitis, hepatitis, dermatitis, and pneumonitis.  Additionally we discussed the side effects of carboplatin which include suppression of blood counts, nephrotoxicity, and nausea.  Additionally we discussed pemetrexed which can also have effects on counts and would require supplementation with vitamin D14 and folic acid.  She voiced her understanding of these side effects and treatment plans moving forward.  I also noted that we would be able to drop the chemotherapy agents that she had intolerance to continue with pembrolizumab alone given her high TPS score.  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 Due to worsening dizziness, she resumed this from Cycle 25-27.  PLAN: --Labs from today were reviewed and adequate for treatment.  Labs today show creatinine 0.72, white blood cell 6.3, Hgb 14.4, MCV 95.3, Plt 402 --repeat CT CAP on 10/11/2022 showed stable disease with no evidence of progression. Next due in May 2024.  --Continue with Cycle 43, Day 1 of maintenance Pem/Pem. --RTC q 3 weeks for labs, follow up visit prior to Cycle 44.    #Nausea/Vomiting, improving -- provided patient with Zofran 4-8mg  q8h PRN and compazine 10mg  q6H for breakthrough PRN.  She is currently using Phenergan for breakthrough. --continue olanzpaine 2.5 mg PO QHS to help with nausea. Marked improvement since the addition of this medication on 01/29/2020.  --continue to monitor   #Hypokalemia, stable --likely 2/2 to decreased PO intake, hypovolemia, and BP medications --Potassium level 3.3 today.   Recommend that she continue oral potassium chloride with treatment --Currently on potassium chloride tablets, 40 mEq PO potassium in AM and 74mEq in PM.   #Pain Control, stable --continue oxycodone 5mg  q4H PRN. Can increase dose to 10mg  q6H if pain is severe. She is taking approximately 2-3 pills per day.  --continue Xtampza 13.5 mg ER BID for long acting support.  --continue to take with Senokot to prevent opioid induced constipation. 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 Last 01/18/2022, next dose currently due.  --nausea as above    #Brain Metastasis, stable --appreciate the assistance of Dr. 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 --radiation therapy to the brain completed on 11/16/2019.  --MRI brain on 04/29/2020 showed evidence of small brain bleed. Eliquis therapy was discontinued.  --MRI on 11/04/2020  showed concern for progression of intracranial disease.  --On 12/02/2020, patient underwent craniotomy, resection of progressive cerebellar mass by Dr. Vertell Limber. 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  --MRI on 12/23/2020 showed evidence of radiation necrosis. Dr. Mickeal Skinner evaluated the patient on 01/02/2021 with recommendations to add IV Bevacizumab 10 mg/kg q 3 weeks ( started 01/26/2021). Completed a full course of this therapy. --Patient was evaluated by Dr. Mickeal Skinner on 08/29/2021 and recommended to resume Bevacizumab due to worsening dizziness.  Which was given from Cycle 25-27.  Plan: --continue to follow with Dr. Mickeal Skinner  --Avastin added back in the patient's chemotherapy regimen. Will restart in 4 weeks from today (out from dental procedure).  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    # Hypertension: --Continue Norvasc to 10 mg daily.  --continue to monitor at home and in clinic.    No orders of the defined types were placed in this encounter.   All questions were answered. The patient knows to call the clinic with any problems, questions or concerns.  I have spent a total of 30 minutes minutes of face-to-face and non-face-to-face time, preparing to see the patient, performing a medically appropriate examination, counseling and educating the patient,  documenting clinical information in the electronic health record, and care coordination.   Ledell Peoples, MD Department of Hematology/Oncology [place] [facility] Phone: [phone] Pager: [phone] Email: [email or web address]   10/21/2022 5:41 PM   Literature Support:  Lorenza Chick, Rodrguez-Abreu D, Duffy Rhody, Felip E, De Angelis F, Domine M, [place], Hochmair MJ,  [place], [place], Bischoff HG, [place], Grossi F, [place], Reck M, [place], [place], [place], Rubio-Viqueira B, Novello S, Kurata T, Gray JE, Vida J, Wei Z, Yang J, Raftopoulos H, [place], [place] [employer]; [id number] Investigators. Pembrolizumab plus Chemotherapy in Metastatic Non-Small-Cell Lung Cancer. [facility] [employer] Med. 2018 May 31;378(22):2078-2092.  --Median progression-free survival was 8.8 months (95% CI, 7.6 to 9.2) in the pembrolizumab-combination group and 4.9 months (95% CI, 4.7 to 5.5) in the placebo-combination group (hazard ratio for disease progression or death, 0.52; 95% CI, 0.43 to 0.64; P<0.001). Adverse events of grade 3 or higher occurred in 67.2% of the patients in the pembrolizumab-combination group and in 65.8% of those in the placebo-combination group.  Jodene Nam L. Efficacy of pemetrexed-based regimens in advanced non-small cell lung cancer patients with activating epidermal growth factor receptor mutations after tyrosine kinase inhibitor failure: a systematic review. Onco Targets Ther. 2018;11:2121-2129.   --The weighted median PFS, median OS, and ORR for patients treated with pem regimens were 5.09 months, 15.91 months, and 30.19%, respectively. Our systematic review results showed a favorable efficacy profile of pem regimens in NSCLC patients with EGFR mutation after EGFR-TKI failure.

## 2022-10-21 ENCOUNTER — Encounter: Payer: Self-pay | Admitting: Hematology and Oncology

## 2022-10-23 ENCOUNTER — Ambulatory Visit: Payer: Medicare Other | Attending: Internal Medicine | Admitting: Physical Therapy

## 2022-10-23 ENCOUNTER — Encounter: Payer: Self-pay | Admitting: Physical Therapy

## 2022-10-23 ENCOUNTER — Encounter: Payer: Self-pay | Admitting: Hematology and Oncology

## 2022-10-23 VITALS — BP 112/88 | HR 103

## 2022-10-23 DIAGNOSIS — R42 Dizziness and giddiness: Secondary | ICD-10-CM | POA: Diagnosis present

## 2022-10-23 DIAGNOSIS — R2681 Unsteadiness on feet: Secondary | ICD-10-CM | POA: Insufficient documentation

## 2022-10-23 NOTE — Therapy (Signed)
OUTPATIENT PHYSICAL THERAPY VESTIBULAR EVALUATION     Patient Name: Virginia [name] MRN: [id number] DOB:1956/01/30, 67 y.o., female Today's Date: 10/23/2022  END OF SESSION:  PT End of Session - 10/23/22 1237     Visit Number 1    Number of Visits 7   including eval   Date for PT Re-Evaluation 12/18/22    Authorization Type Medicare    PT Start Time 1234    PT Stop Time 1325    PT Time Calculation (min) 51 min    Equipment Utilized During Treatment Gait belt    Activity Tolerance Patient limited by fatigue    Behavior During Therapy WFL for tasks assessed/performed             Past Medical History:  Diagnosis Date   Anemia    Anxiety    Concussion 09/28/2019   DVT (deep venous thrombosis) ([place]) 2021   L leg   Dyspnea    GERD (gastroesophageal reflux disease)    Hypercholesterolemia    per pt, she does not have elevated lipids   Hypertension    met lung ca dx'd 09/2019   mets to spine, hip and brain   PONV (postoperative nausea and vomiting)    Tobacco abuse    Past Surgical History:  Procedure Laterality Date   ABDOMINAL HYSTERECTOMY     partial/ left ovaries   APPLICATION OF CRANIAL NAVIGATION N/A 12/02/2020   Procedure: APPLICATION OF CRANIAL NAVIGATION;  Surgeon: Erline Levine, MD;  Location: [facility];  Service: Neurosurgery;  Laterality: N/A;   CHOLECYSTECTOMY     CRANIOTOMY N/A 12/02/2020   Procedure: Posterior fossa craniotomy for tumor resection with brainlab;  Surgeon: Erline Levine, MD;  Location: [facility];  Service: Neurosurgery;  Laterality: N/A;   DILATION AND CURETTAGE OF UTERUS     IR IMAGING GUIDED PORT INSERTION  10/23/2019   IR PATIENT EVAL TECH 0-60 MINS  11/21/2021   IR PATIENT EVAL TECH 0-60 MINS  11/24/2021   IR PATIENT EVAL TECH 0-60 MINS  11/29/2021   IR PATIENT EVAL TECH 0-60 MINS  12/04/2021   IR PATIENT EVAL TECH 0-60 MINS  12/12/2021   IR PATIENT EVAL TECH 0-60 MINS  12/19/2021   IR PATIENT EVAL TECH 0-60 MINS  12/27/2021   IR PATIENT EVAL  TECH 0-60 MINS  01/03/2022   IR PATIENT EVAL TECH 0-60 MINS  01/10/2022   IR PATIENT EVAL TECH 0-60 MINS  01/18/2022   IR PATIENT EVAL TECH 0-60 MINS  02/13/2022   IR PATIENT EVAL TECH 0-60 MINS  02/20/2022   IR RADIOLOGIST EVAL & MGMT  01/23/2022   IR REMOVAL TUN ACCESS W/ PORT W/O FL MOD SED  11/17/2021   KYPHOPLASTY N/A 03/15/2020   Procedure: Thoracic Eight KYPHOPLASTY;  Surgeon: Erline Levine, MD;  Location: [facility];  Service: Neurosurgery;  Laterality: N/A;  prone    LIPOMA EXCISION  2018   removed under left breast and right thigh.   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 ([place]) 03/15/2020   Goals of care, counseling/discussion 02/25/2020   Palliative care patient 12/16/2019  Malignant neoplasm metastatic to bone ([place]) 10/21/2019   Malignant neoplasm metastatic to brain ([place]) 10/15/2019   Right lower lobe lung mass 09/25/2019   COPD (chronic obstructive pulmonary disease) ([place]) 09/25/2019   Impaired glucose tolerance 12/08/2017   Vitamin D deficiency 12/08/2017   Microscopic hematuria 04/01/2014   Hypertension 01/11/2012   Palpitations 01/11/2012   Hyperlipidemia 12/25/2011   History of smoking 12/25/2011    PCP: Elby Showers, MD REFERRING PROVIDER: Ventura Sellers, MD  REFERRING DIAG: C79.31 (ICD-10-CM) - Metastasis to brain [facility]) I67.89,Y84.2 (ICD-10-CM) - Radiation therapy induced brain necrosis R42 (ICD-10-CM) - Vertigo  THERAPY DIAG:  Dizziness and giddiness - Plan: PT plan of care cert/re-cert  Unsteadiness on feet - Plan: PT plan of care cert/re-cert  ONSET DATE: 03/05/9484  Rationale for Evaluation and  Treatment: Rehabilitation  SUBJECTIVE:   SUBJECTIVE STATEMENT: Patient reports that she had a surgery in March 2022 to remove brain mets - "post posterior fofsa craniotamy" per spouse. Patient reports surgery impacted cerebellum. Patient husband reports on Novemeber 15 patient fell off toilet and hit head on sink due to dizziness. Patient had CT scan, which showed some blood pooling in brain and patient had second scan completed more recently but doctor did not report any changes. Has been addressed per caregiver/patient. Patient caregiver reports than when patient follows finger her "eyes will jump." Patient has 15 stairs that she has to go down which have become increasingly more difficult due to weakness/mobility/dizziness. Patient caregiver is assisting her both up and down the stairs. Patient reports that it is getting hard to walk and is using rollator to get up around the home. Patient was previously treated at [facility] for balance deficits but reports discontinuing OT/PT there as she saw little improvement.   Pt accompanied by: significant other - Randy husband  PERTINENT HISTORY: compression fracture in spine due to metastases with pain medication management from codeine, lung cancer with mestatis diagnosed around January 2021; vertigo, hypertension  PAIN:  Are you having pain? Yes: NPRS scale: 7/10 Pain location: mid back Pain description: achey Aggravating factors: sitting for long periods of time/standing for long periods of time Relieving factors: heat pad/pain medication  PRECAUTIONS: Fall  WEIGHT BEARING RESTRICTIONS: No  FALLS: Has patient fallen in last 6 months? Yes. Number of falls 1 fall reaching for toilet paper  LIVING ENVIRONMENT: Lives with: lives with their spouse Lives in: House/apartment Stairs: Yes: Internal: 5 steps; on left going up and External: 14 steps; on left going up Has following equipment at home: Environmental consultant - 4 wheeled, Wheelchair (manual), Theatre stage manager  PLOF: Independent  PATIENT GOALS: "I want to be able to drive, walk, and take a shower again."  OBJECTIVE:   DIAGNOSTIC FINDINGS:   IMPRESSION per MR Brain 10/11/2022: Stable exam since 08/10/2022 with unchanged patchy enhancement in the bilateral cerebellar hemispheres and unchanged right parietal and occipital lesions. No new lesions or worsening FLAIR signal abnormality. The small bilateral subdural collections are slightly decreased in size since 08/10/2022.  IMPRESSION CT CHEST ABDOMEN PELVIS W/ CONTRAST: Overall stable appearance with bandlike opacity involving the right lower lobe extending to the hilum. Scattered small ground-glass type nodules in both lungs. Stable pericardial effusion. No bowel obstruction, free air or free fluid. Scattered skeletal sclerotic metastases. Once again there is augmentation cement at T8   COGNITION: Overall cognitive status: Impaired - reports increased difficulty focusing but [facility] for answering most questions during session   SENSATION: none  EDEMA:  Mild  edema RLE  POSTURE:  rounded shoulders, forward head, and increased thoracic kyphosis and notable PPT sitting in [facility]  Cervical ROM:    Active A/PROM (deg) eval  Flexion WFL  Extension 20% AROM; pinching in back of neck  Right lateral flexion 50% AROM  Left lateral flexion 50% AROM  Right rotation 50% AROM  Left rotation 50% AROM  (Blank rows = not tested)  PATIENT SURVEYS:  FOTO 39.1  VESTIBULAR ASSESSMENT:  GENERAL OBSERVATION: Holds head still; hesitant to look up; saccadic eye movements as patient looks around room   SYMPTOM BEHAVIOR: Subjective history: Patient reports dizziness since craniotomy, once in a while room feels like it is spinning (out of the blue) when sitting still, patient reports that she feels dizzy all the time; patient feels like she can't move without falling over, sometimes looks like things are moving Non-Vestibular symptoms: changes in  hearing, changes in vision, diplopia, neck pain, headaches, tinnitus, and nausea/vomiting Type of dizziness: Blurred Vision, Diplopia, Imbalance (Disequilibrium), Oscillopsia, Spinning/Vertigo, Unsteady with head/body turns, Lightheadedness/Faint, "Funny feeling in the head", "World moves", and "Swimmyheaded"  Frequency: everyday - all day; some days worse than others  Duration: all the time  Aggravating factors:  moving head/walking and standing up  Relieving factors: rest  Progression of symptoms: unchanged  OCULOMOTOR EXAM:  Ocular Alignment: normal  Ocular ROM: No Limitations  Spontaneous Nystagmus: absent  Gaze-Induced Nystagmus: direction changing   Smooth Pursuits: saccades  Saccades: dysmetria, hypermetric/overshoots, and extra eye movements  Convergence/Divergence: 10 cm   VESTIBULAR - OCULAR REFLEX:  Slow VOR: Positive Bilaterally- reports notable increase dizziness and takes several seconds to recover  VOR Cancellation: Unable to Maintain Gaze Head-Impulse Test: Unable to get accurate read due to significant cervical guarding; difficultly keeping eye on target bilaterally  VESTIBULAR TREATMENT:                                                                                                    Initial Eval only  PATIENT EDUCATION: Education details: POC, examination findings, results Person educated: Patient and Spouse Education method: Explanation Education comprehension: verbalized understanding and needs further education  HOME EXERCISE PROGRAM:  To be provided  GOALS: Goals reviewed with patient? Yes  LONG TERM GOALS: (STG = LTG given POC exams) Target date: 12/04/2022  Patient and patient caregiver will report demonstrate modified independence with final HEP in order to maintain current gains and continue to progress after physical therapy discharge.   Baseline: To be provided Goal status: INITIAL  2.  Appropriate components of MSQ to be assessed and goal  written Baseline: To be assessed Goal status: INITIAL  3. Patient will improve FOTO score to 51 or greater to indicate anticipated functional gains to reduce impairment due to dizziness. Baseline: 39.1 Goal status: INITIAL   ASSESSMENT:  CLINICAL IMPRESSION: Patient is a 67 y.o. female who was seen today for physical therapy evaluation and treatment for vertigo related to metastatic lung cancer to the brain. Patient presents with significant central signs including abnormal VOR cancellation, direction changing nystagmus, saccadic smooth pursuits, and abnormal convergence consistent  with referral diagnosis. Patient also presents with reduced tolerance for rapid head movements. Patient will benefit from skilled physical therapy services to introduce habituation/compensatory strategies to address impairments in order to achieve maximal gains. Provided caregiver with card of home DME organization to help determine what equipment might best adapt home environment.   OBJECTIVE IMPAIRMENTS: decreased balance, decreased cognition, decreased endurance, decreased mobility, difficulty walking, decreased ROM, decreased strength, dizziness, increased edema, impaired UE functional use, and pain.   ACTIVITY LIMITATIONS: carrying, lifting, bending, sitting, standing, squatting, sleeping, stairs, transfers, bed mobility, bathing, reach over head, and caring for others  PARTICIPATION LIMITATIONS: meal prep, laundry, interpersonal relationship, driving, and community activity  PERSONAL FACTORS: Past/current experiences and 3+ comorbidities: see above  are also affecting patient's functional outcome.   REHAB POTENTIAL: Fair given central findings and progressive  CLINICAL DECISION MAKING: Evolving/moderate complexity  EVALUATION COMPLEXITY: Moderate   PLAN:  PT FREQUENCY: 1x/week  PT DURATION: 8 weeks  PLANNED INTERVENTIONS: Therapeutic exercises, Therapeutic activity, Neuromuscular re-education,  Balance training, Gait training, Patient/Family education, Self Care, Vestibular training, Canalith repositioning, Manual therapy, and Re-evaluation  PLAN FOR NEXT SESSION: Evaluate appropriate components of MSQ (pending patient's subjective may not tolerate supine testing due to compression fx in back and cervical limitations; recommend seated testing primarily), compensatory stragies, VOR/smooth pursuits as tolerated, initial HEP   Esperanza Heir, PT, DPT 10/23/2022, 5:36 PM

## 2022-10-29 ENCOUNTER — Telehealth: Payer: Self-pay

## 2022-10-29 ENCOUNTER — Encounter: Payer: Self-pay | Admitting: Physical Therapy

## 2022-10-29 ENCOUNTER — Ambulatory Visit: Payer: Medicare Other | Admitting: Physical Therapy

## 2022-10-29 VITALS — BP 130/104 | HR 97

## 2022-10-29 DIAGNOSIS — R42 Dizziness and giddiness: Secondary | ICD-10-CM

## 2022-10-29 DIAGNOSIS — R2681 Unsteadiness on feet: Secondary | ICD-10-CM

## 2022-10-29 NOTE — Telephone Encounter (Signed)
T/C from pt's husband, [name], stating pt went to PT this morning and BP was elevated  130/100 and 130/104.  He said they were advised to contact PCP and/or go to the ED.  He said this seems to be the norm for her with infusion anxiety and thought this was the same for PT.  He is asking for recommendations if her should take her to the ED and if she can continue her PT?  Please advise.

## 2022-10-29 NOTE — Telephone Encounter (Signed)
Virginia [name] advised via T/C and My Chart message.

## 2022-10-29 NOTE — Therapy (Signed)
[facility] [address] [place], [place], [address] Phone: [phone]   Fax:  [phone]  Patient Details  Name: Virginia [name] MRN: [id number] Date of Birth: 1956-02-05 Referring Provider:  Ventura Sellers, MD  Encounter Date: 10/29/2022  Session was arrive no charge. BP outside of safe readings with diastolic > 035 mmHg. Educated Tourist information centre manager on BP safety for therapy. Recommended calling PCP about next steps as patient states that BP does run high around time of transfusions while still expressing concerns around current findings and going to ED if became symptomatic. Patient does report dizziness as 10/10 but does present with dizziness/vertigo at baseline.   Vitals:   10/29/22 1105 10/29/22 1110  BP: (!) 134/100 (!) 130/104  Pulse: 97      Esperanza Heir, PT, DPT 10/29/2022, 11:24 AM  [facility] [address] [place], [place], [address] Phone: [phone]   Fax:  [phone]

## 2022-10-30 ENCOUNTER — Telehealth: Payer: Self-pay | Admitting: *Deleted

## 2022-10-30 NOTE — Telephone Encounter (Signed)
[name] states Virginia [name] has recently signed up for Medicare part D. She needs a 30 day supply of Xtampza to be ordered on Friday, 2/23 and a 7 day supply of Oxycodone to be ordered on Friday. Please send to [facility] on [address].

## 2022-11-02 ENCOUNTER — Other Ambulatory Visit: Payer: Self-pay | Admitting: Hematology and Oncology

## 2022-11-02 MED ORDER — OXYCODONE HCL 5 MG PO TABS: ORAL_TABLET | ORAL | 0 refills | Status: DC

## 2022-11-02 MED ORDER — XTAMPZA ER 13.5 MG PO C12A: 1.0000 | EXTENDED_RELEASE_CAPSULE | Freq: Two times a day (BID) | ORAL | 0 refills | Status: DC

## 2022-11-06 ENCOUNTER — Ambulatory Visit: Payer: Medicare Other | Admitting: Physical Therapy

## 2022-11-06 ENCOUNTER — Encounter: Payer: Self-pay | Admitting: Physical Therapy

## 2022-11-06 VITALS — BP 116/86

## 2022-11-06 DIAGNOSIS — R42 Dizziness and giddiness: Secondary | ICD-10-CM

## 2022-11-06 DIAGNOSIS — R2681 Unsteadiness on feet: Secondary | ICD-10-CM

## 2022-11-06 NOTE — Therapy (Signed)
OUTPATIENT PHYSICAL THERAPY VESTIBULAR TREATMENT   Patient Name: Virginia [name] MRN: [id number] DOB:[birth year], 67 y.o., female Today's Date: 11/06/2022  END OF SESSION:  PT End of Session - 11/06/22 1057     Visit Number 2    Number of Visits 7    Date for PT Re-Evaluation 12/18/22    Authorization Type Medicare    PT Start Time 1100    PT Stop Time 1147    PT Time Calculation (min) 47 min    Equipment Utilized During Treatment Other (comment)   performed in seated position   Activity Tolerance Patient tolerated treatment well    Behavior During Therapy [facility] for tasks assessed/performed             Past Medical History:  Diagnosis Date   Anemia    Anxiety    Concussion 09/28/2019   DVT (deep venous thrombosis) ([place]) 2021   L leg   Dyspnea    GERD (gastroesophageal reflux disease)    Hypercholesterolemia    per pt, she does not have elevated lipids   Hypertension    met lung ca dx'd 09/2019   mets to spine, hip and brain   PONV (postoperative nausea and vomiting)    Tobacco abuse    Past Surgical History:  Procedure Laterality Date   ABDOMINAL HYSTERECTOMY     partial/ left ovaries   APPLICATION OF CRANIAL NAVIGATION N/A 12/02/2020   Procedure: APPLICATION OF CRANIAL NAVIGATION;  Surgeon: Erline Levine, MD;  Location: [place];  Service: Neurosurgery;  Laterality: N/A;   CHOLECYSTECTOMY     CRANIOTOMY N/A 12/02/2020   Procedure: Posterior fossa craniotomy for tumor resection with brainlab;  Surgeon: Erline Levine, MD;  Location: [place];  Service: Neurosurgery;  Laterality: N/A;   DILATION AND CURETTAGE OF UTERUS     IR IMAGING GUIDED PORT INSERTION  10/23/2019   IR PATIENT EVAL TECH 0-60 MINS  11/21/2021   IR PATIENT EVAL TECH 0-60 MINS  11/24/2021   IR PATIENT EVAL TECH 0-60 MINS  11/29/2021   IR PATIENT EVAL TECH 0-60 MINS  12/04/2021   IR PATIENT EVAL TECH 0-60 MINS  12/12/2021   IR PATIENT EVAL TECH 0-60 MINS  12/19/2021   IR PATIENT EVAL TECH 0-60 MINS  12/27/2021    IR PATIENT EVAL TECH 0-60 MINS  01/03/2022   IR PATIENT EVAL TECH 0-60 MINS  01/10/2022   IR PATIENT EVAL TECH 0-60 MINS  01/18/2022   IR PATIENT EVAL TECH 0-60 MINS  02/13/2022   IR PATIENT EVAL TECH 0-60 MINS  02/20/2022   IR RADIOLOGIST EVAL & MGMT  01/23/2022   IR REMOVAL TUN ACCESS W/ PORT W/O FL MOD SED  11/17/2021   KYPHOPLASTY N/A 03/15/2020   Procedure: Thoracic Eight KYPHOPLASTY;  Surgeon: Erline Levine, MD;  Location: [place];  Service: Neurosurgery;  Laterality: N/A;  prone    LIPOMA EXCISION  2018   removed under left breast and right thigh.   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 ([place]) 03/15/2020   Goals of care, counseling/discussion 02/25/2020   Palliative care patient 12/16/2019  Malignant neoplasm metastatic to bone ([place]) 10/21/2019   Malignant neoplasm metastatic to brain ([place]) 10/15/2019   Right lower lobe lung mass 09/25/2019   COPD (chronic obstructive pulmonary disease) ([place]) 09/25/2019   Impaired glucose tolerance 12/08/2017   Vitamin D deficiency 12/08/2017   Microscopic hematuria 04/01/2014   Hypertension 01/11/2012   Palpitations 01/11/2012   Hyperlipidemia 12/25/2011   History of smoking 12/25/2011    PCP: Elby Showers, MD REFERRING PROVIDER: Ventura Sellers, MD  REFERRING DIAG: C79.31 (ICD-10-CM) - Metastasis to brain [facility]) I67.89,Y84.2 (ICD-10-CM) - Radiation therapy induced brain necrosis R42 (ICD-10-CM) - Vertigo  THERAPY DIAG:  Dizziness and giddiness  Unsteadiness on feet  ONSET DATE: 10/15/2022  Rationale for Evaluation and Treatment: Rehabilitation  SUBJECTIVE:   SUBJECTIVE  STATEMENT: Patient has started taking meclize to help with dizziness. Patient BP readings this morning were 111/86 and 131/53mHg. Patient wasn't able to her walking laps yesterday because she was too dizzy. Patient reports dizziness 3/10.   Pt accompanied by: significant other - Randy husband  PERTINENT HISTORY: compression fracture in spine due to metastases with pain medication management from codeine, lung cancer with mestatis diagnosed around January 2021; vertigo, hypertension  PAIN:  Are you having pain? Yes: NPRS scale: 6/10 Pain location: L back pain Pain description: achey Aggravating factors: sitting for long periods of time/standing for long periods of time Relieving factors: heat pad/pain medication  PRECAUTIONS: Fall (thoracic spine fracture due to mets)  WEIGHT BEARING RESTRICTIONS: No  FALLS: Has patient fallen in last 6 months? Yes. Number of falls 1 fall reaching for toilet paper  LIVING ENVIRONMENT: Lives with: lives with their spouse Lives in: House/apartment Stairs: Yes: Internal: 5 steps; on left going up and External: 14 steps; on left going up Has following equipment at home: WEnvironmental consultant- 4 wheeled, Wheelchair (manual), aManufacturing engineer PLOF: Independent  PATIENT GOALS: "I want to be able to drive, walk, and take a shower again."  OBJECTIVE:   DIAGNOSTIC FINDINGS:   IMPRESSION per MR Brain 10/11/2022: Stable exam since 08/10/2022 with unchanged patchy enhancement in the bilateral cerebellar hemispheres and unchanged right parietal and occipital lesions. No new lesions or worsening FLAIR signal abnormality. The small bilateral subdural collections are slightly decreased in size since 08/10/2022.  IMPRESSION CT CHEST ABDOMEN PELVIS W/ CONTRAST: Overall stable appearance with bandlike opacity involving the right lower lobe extending to the hilum. Scattered small ground-glass type nodules in both lungs. Stable pericardial effusion. No bowel obstruction,  free air or free fluid. Scattered skeletal sclerotic metastases. Once again there is augmentation cement at T8   COGNITION: Overall cognitive status: Impaired - reports increased difficulty focusing but [facility] answering most questions during session    VESTIBULAR TREATMENT:                                                                                                    Session completed seated in transport chair    MOTION SENSITIVITY:    Motion Sensitivity Quotient Intensity: 0 = none, 1 = Lightheaded, 2 = Mild, 3 = Moderate, 4 =  Severe, 5 = Vomiting  Intensity  1. Sitting to supine held  2. Supine to L side held  3. Supine to R side held  4. Supine to sitting held  5. L Hallpike-Dix held  6. Up from L  held  7. R Hallpike-Dix held  8. Up from R  held  9. Sitting, head  tipped to L knee 2 (modified ROM due to back)  10. Head up from L  knee 1 (modified ROM due to back)  11. Sitting, head  tipped to R knee 3 (modified ROM due to back)  12. Head up from R  knee 1 (modified ROM due to back)  13. Sitting head turns x5 4   14.Sitting head nods x5 4  15. In stance, 180  turn to L  held  16. In stance, 180  turn to R Held   MSQ: 15/30 or 50% impairment (*Patient had taken meclizine which may impact results)   Saccades plain background seated 3 x 30"  - horizontal (3/5 for dizziness)  - vertical (1-3/5 for dizziness)  - diagonal (3-4/5 for dizziness)  VOR x 1 viewing seated plain background 3 x 30"  - horizontal (4/5 dizziness)  - vertical (3/5)  PATIENT EDUCATION: Education details: Initial HEP Person educated: Patient and Spouse Education method: Explanation Education comprehension: verbalized understanding and needs further education  HOME EXERCISE PROGRAM:  Access Code: [email or web address] URL: [email or web address] Date: 11/06/2022 Prepared by: Malachi Carl  Exercises - Seated Horizontal Saccades  - 1 x daily - 7 x weekly - 3 sets - 30 hold -  Seated Diagonal Saccades  - 1 x daily - 7 x weekly - 3 sets - 30 hold - Seated Vertical Saccades  - 1 x daily - 7 x weekly - 3 sets - 30 hold - Seated Gaze Stabilization with Head Nod  - 1 x daily - 7 x weekly - 3 sets - 30 hold - Seated Gaze Stabilization with Head Rotation  - 1 x daily - 7 x weekly - 3 sets - 30 hold  GOALS: Goals reviewed with patient? Yes  LONG TERM GOALS: (STG = LTG given POC exams) Target date: 12/04/2022  Patient and patient caregiver will report demonstrate modified independence with final HEP in order to maintain current gains and continue to progress after physical therapy discharge.   Baseline: To be provided Goal status: INITIAL  2.  Patient will improve MSQ score by 4 points to indicate reduction in motion sensitivity in order to progress towards baseline function.   Baseline: 15/30 Goal status: INITIAL  3. Patient will improve FOTO score to 51 or greater to indicate anticipated functional gains to reduce impairment due to dizziness. Baseline: 39.1 Goal status: INITIAL   ASSESSMENT:  CLINICAL IMPRESSION: Session emphasized assessment of components of appropriate MSQ in addition to creation of initial HEP. Patient required closed eyes rest breaks throughout session for recovery. Reports greater provocation of symptoms with horizontal compared to vertical. Patient will benefit from skilled physical therapy services to introduce habituation/compensatory strategies to address impairments in order to achieve maximal gains.  OBJECTIVE IMPAIRMENTS: decreased balance, decreased cognition, decreased endurance, decreased mobility, difficulty walking, decreased ROM, decreased strength, dizziness, increased edema, impaired UE functional use, and pain.   ACTIVITY LIMITATIONS: carrying, lifting, bending, sitting, standing, squatting, sleeping, stairs, transfers, bed mobility, bathing, reach over head, and caring for others  PARTICIPATION LIMITATIONS: meal prep, laundry,  interpersonal relationship, driving, and community activity  PERSONAL FACTORS: Past/current experiences and 3+ comorbidities:  see above  are also affecting patient's functional outcome.   REHAB POTENTIAL: Fair given central findings and progressive  CLINICAL DECISION MAKING: Evolving/moderate complexity  EVALUATION COMPLEXITY: Moderate   PLAN:  PT FREQUENCY: 1x/week  PT DURATION: 8 weeks  PLANNED INTERVENTIONS: Therapeutic exercises, Therapeutic activity, Neuromuscular re-education, Balance training, Gait training, Patient/Family education, Self Care, Vestibular training, Canalith repositioning, Manual therapy, and Re-evaluation  PLAN FOR NEXT SESSION:  compensatory stragies, VOR/smooth pursuits as tolerated, review initial HEP, components of MSQ  Esperanza Heir, PT, DPT 11/06/2022, 12:07 PM

## 2022-11-07 ENCOUNTER — Inpatient Hospital Stay: Payer: Medicare Other | Admitting: Physician Assistant

## 2022-11-07 ENCOUNTER — Inpatient Hospital Stay: Payer: Medicare Other

## 2022-11-08 ENCOUNTER — Inpatient Hospital Stay: Payer: Medicare Other

## 2022-11-08 ENCOUNTER — Inpatient Hospital Stay: Payer: Medicare Other | Admitting: Hematology and Oncology

## 2022-11-09 ENCOUNTER — Inpatient Hospital Stay: Payer: Medicare Other

## 2022-11-09 ENCOUNTER — Inpatient Hospital Stay: Payer: Medicare Other | Attending: Hematology and Oncology | Admitting: Hematology and Oncology

## 2022-11-09 ENCOUNTER — Encounter: Payer: Self-pay | Admitting: Hematology and Oncology

## 2022-11-09 VITALS — BP 120/92 | HR 94 | Temp 98.3°F | Resp 15 | Wt 140.7 lb

## 2022-11-09 DIAGNOSIS — C3431 Malignant neoplasm of lower lobe, right bronchus or lung: Secondary | ICD-10-CM | POA: Diagnosis present

## 2022-11-09 DIAGNOSIS — C7931 Secondary malignant neoplasm of brain: Secondary | ICD-10-CM | POA: Diagnosis not present

## 2022-11-09 DIAGNOSIS — Z7901 Long term (current) use of anticoagulants: Secondary | ICD-10-CM | POA: Diagnosis not present

## 2022-11-09 DIAGNOSIS — Z95828 Presence of other vascular implants and grafts: Secondary | ICD-10-CM

## 2022-11-09 DIAGNOSIS — R918 Other nonspecific abnormal finding of lung field: Secondary | ICD-10-CM

## 2022-11-09 DIAGNOSIS — R42 Dizziness and giddiness: Secondary | ICD-10-CM | POA: Diagnosis not present

## 2022-11-09 DIAGNOSIS — Z90710 Acquired absence of both cervix and uterus: Secondary | ICD-10-CM | POA: Diagnosis not present

## 2022-11-09 DIAGNOSIS — C7951 Secondary malignant neoplasm of bone: Secondary | ICD-10-CM

## 2022-11-09 DIAGNOSIS — Z923 Personal history of irradiation: Secondary | ICD-10-CM | POA: Insufficient documentation

## 2022-11-09 DIAGNOSIS — Z5111 Encounter for antineoplastic chemotherapy: Secondary | ICD-10-CM | POA: Insufficient documentation

## 2022-11-09 DIAGNOSIS — Z9049 Acquired absence of other specified parts of digestive tract: Secondary | ICD-10-CM | POA: Insufficient documentation

## 2022-11-09 DIAGNOSIS — Z79620 Long term (current) use of immunosuppressive biologic: Secondary | ICD-10-CM | POA: Insufficient documentation

## 2022-11-09 DIAGNOSIS — Z79899 Other long term (current) drug therapy: Secondary | ICD-10-CM | POA: Diagnosis not present

## 2022-11-09 DIAGNOSIS — Z86718 Personal history of other venous thrombosis and embolism: Secondary | ICD-10-CM | POA: Diagnosis not present

## 2022-11-09 DIAGNOSIS — Z5112 Encounter for antineoplastic immunotherapy: Secondary | ICD-10-CM | POA: Diagnosis present

## 2022-11-09 DIAGNOSIS — Z8673 Personal history of transient ischemic attack (TIA), and cerebral infarction without residual deficits: Secondary | ICD-10-CM | POA: Insufficient documentation

## 2022-11-09 DIAGNOSIS — Z79631 Long term (current) use of antimetabolite agent: Secondary | ICD-10-CM | POA: Insufficient documentation

## 2022-11-09 LAB — CMP (CANCER CENTER ONLY)
ALT: 17 U/L (ref 0–44)
AST: 26 U/L (ref 15–41)
Albumin: 3.6 g/dL (ref 3.5–5.0)
Alkaline Phosphatase: 56 U/L (ref 38–126)
Anion gap: 12 (ref 5–15)
BUN: 14 mg/dL (ref 8–23)
CO2: 28 mmol/L (ref 22–32)
Calcium: 9.1 mg/dL (ref 8.9–10.3)
Chloride: 100 mmol/L (ref 98–111)
Creatinine: 0.8 mg/dL (ref 0.44–1.00)
GFR, Estimated: >60 mL/min (ref >60)
Glucose, Bld: 97 mg/dL (ref 70–99)
Potassium: 3.4 mmol/L — ABNORMAL LOW (ref 3.5–5.1)
Sodium: 140 mmol/L (ref 135–145)
Total Bilirubin: 0.6 mg/dL (ref 0.3–1.2)
Total Protein: 7.8 g/dL (ref 6.5–8.1)

## 2022-11-09 LAB — CBC WITH DIFFERENTIAL (CANCER CENTER ONLY)
Abs Immature Granulocytes: 0.02 10*3/uL (ref 0.00–0.07)
Basophils Absolute: 0 10*3/uL (ref 0.0–0.1)
Basophils Relative: 1 %
Eosinophils Absolute: 0.1 10*3/uL (ref 0.0–0.5)
Eosinophils Relative: 2 %
HCT: 40.3 % (ref 36.0–46.0)
Hemoglobin: 13.9 g/dL (ref 12.0–15.0)
Immature Granulocytes: 0 %
Lymphocytes Relative: 40 %
Lymphs Abs: 2.2 10*3/uL (ref 0.7–4.0)
MCH: 33.7 pg (ref 26.0–34.0)
MCHC: 34.5 g/dL (ref 30.0–36.0)
MCV: 97.6 fL (ref 80.0–100.0)
Monocytes Absolute: 0.7 10*3/uL (ref 0.1–1.0)
Monocytes Relative: 12 %
Neutro Abs: 2.5 10*3/uL (ref 1.7–7.7)
Neutrophils Relative %: 45 %
Platelet Count: 399 10*3/uL (ref 150–400)
RBC: 4.13 MIL/uL (ref 3.87–5.11)
RDW: 13.9 % (ref 11.5–15.5)
WBC Count: 5.6 10*3/uL (ref 4.0–10.5)
nRBC: 0 % (ref 0.0–0.2)

## 2022-11-09 LAB — TSH: TSH: 1.961 u[IU]/mL (ref 0.350–4.500)

## 2022-11-09 MED ORDER — SODIUM CHLORIDE 0.9 % IV SOLN
200.0000 mg | Freq: Once | INTRAVENOUS | Status: AC
  Administered 2022-11-09: 200 mg via INTRAVENOUS
  Filled 2022-11-09: qty 8

## 2022-11-09 MED ORDER — ZOLEDRONIC ACID 4 MG/100ML IV SOLN
4.0000 mg | INTRAVENOUS | Status: DC
  Administered 2022-11-09: 4 mg via INTRAVENOUS
  Filled 2022-11-09: qty 100

## 2022-11-09 MED ORDER — CYANOCOBALAMIN 1000 MCG/ML IJ SOLN
1000.0000 ug | Freq: Once | INTRAMUSCULAR | Status: AC
  Administered 2022-11-09: 1000 ug via INTRAMUSCULAR
  Filled 2022-11-09: qty 1

## 2022-11-09 MED ORDER — PROCHLORPERAZINE MALEATE 10 MG PO TABS
10.0000 mg | ORAL_TABLET | Freq: Once | ORAL | Status: AC
  Administered 2022-11-09: 10 mg via ORAL
  Filled 2022-11-09: qty 1

## 2022-11-09 MED ORDER — SODIUM CHLORIDE 0.9 % IV SOLN
500.0000 mg/m2 | Freq: Once | INTRAVENOUS | Status: AC
  Administered 2022-11-09: 900 mg via INTRAVENOUS
  Filled 2022-11-09: qty 20

## 2022-11-09 MED ORDER — SODIUM CHLORIDE 0.9 % IV SOLN: Freq: Once | INTRAVENOUS | Status: AC

## 2022-11-09 NOTE — Patient Instructions (Signed)
[facility]  Discharge Instructions: Thank you for choosing [facility] to provide your oncology and hematology care.   If you have a lab appointment with the [facility], please go directly to the [facility] and check in at the registration area.   Wear comfortable clothing and clothing appropriate for easy access to any Portacath or PICC line.   We strive to give you quality time with your provider. You may need to reschedule your appointment if you arrive late (15 or more minutes).  Arriving late affects you and other patients whose appointments are after yours.  Also, if you miss three or more appointments without notifying the office, you may be dismissed from the clinic at the provider's discretion.      For prescription refill requests, have your pharmacy contact our office and allow 72 hours for refills to be completed.    Today you received the following chemotherapy and/or immunotherapy agents Keytruda/Alimta.      To help prevent nausea and vomiting after your treatment, we encourage you to take your nausea medication as directed.  BELOW ARE SYMPTOMS THAT SHOULD BE REPORTED IMMEDIATELY: *FEVER GREATER THAN 100.4 F (38 C) OR HIGHER *CHILLS OR SWEATING *NAUSEA AND VOMITING THAT IS NOT CONTROLLED WITH YOUR NAUSEA MEDICATION *UNUSUAL SHORTNESS OF BREATH *UNUSUAL BRUISING OR BLEEDING *URINARY PROBLEMS (pain or burning when urinating, or frequent urination) *BOWEL PROBLEMS (unusual diarrhea, constipation, pain near the anus) TENDERNESS IN MOUTH AND THROAT WITH OR WITHOUT PRESENCE OF ULCERS (sore throat, sores in mouth, or a toothache) UNUSUAL RASH, SWELLING OR PAIN  UNUSUAL VAGINAL DISCHARGE OR ITCHING   Items with * indicate a potential emergency and should be followed up as soon as possible or go to the Emergency Department if any problems should occur.  Please show the CHEMOTHERAPY ALERT CARD or IMMUNOTHERAPY ALERT CARD at  check-in to the Emergency Department and triage nurse.  Should you have questions after your visit or need to cancel or reschedule your appointment, please contact [facility]  Dept: [phone]  and follow the prompts.  Office hours are 8:00 a.m. to 4:30 p.m. Monday - Friday. Please note that voicemails left after 4:00 p.m. may not be returned until the following business day.  We are closed weekends and major holidays. You have access to a nurse at all times for urgent questions. Please call the main number to the clinic Dept: [phone] and follow the prompts.   For any non-urgent questions, you may also contact your provider using MyChart. We now offer e-Visits for anyone 81 and older to request care online for non-urgent symptoms. For details visit [email or web address].   Also download the MyChart app! Go to the app store, search "MyChart", open the app, select [facility] [place], and log in with your MyChart username and password.

## 2022-11-10 ENCOUNTER — Encounter: Payer: Self-pay | Admitting: Hematology and Oncology

## 2022-11-10 NOTE — Progress Notes (Signed)
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 CT of the head showed showed concerning for metastatic disease involving the left cerebellum and right occipital lobe.  2) 09/30/2019: MRI brain confirms mass within the left cerebellum measures 1.6 x 1.3 x 1.5 cm as well as at least 8 metastatic lesions within the supratentorial and infratentorial brain as outlined 3) 10/01/2019: PET CT scan revealed hypermetabolic infrahilar right lower lobe mass with hypermetabolic nodal metastases in the right hilum, mediastinum, right supraclavicular region, left axilla and lower left neck. 4) 10/08/2019: US guided biopsy of the lymph nodes confirms poorly differentiated non-small cell carcinoma. Immunohistochemistry confirms adenocarcinoma of lung primary. PD-L1 and NGS later revealed a MET Exon 14 mutation and TPS of 90%.  5) 10/12/2019: Establish care with Dr. Lorenso Courier  6) 11/04/2019: Day 1 of capmatinib 427m BID 7) 11/09/2019: presented to Rad/Onc with a DVT in LLE. Seen in symptom management clinic and started on apixaban.  8) 12/29/2019: CT C/A/P showed interval decrease in size of mass involving the superior segment of right lower lobe and reduction in mediastinal and left supraclavicular adenopathy though some small spinal lesions were noted in the interim between the two sets of scans 9) 01/25/2020: temporarily stopped capmatinib due to symptoms of nausea/fatigue. Restarted therapy on 01/30/2020 after brief chemo holiday.  10)  03/22/2020: CT C/A/P reveals a mixed picture, with new lung nodule and increased lymphadenopathy, however response in bone lesions. D/c capmatinib therapy 11) 04/08/2020: start of Carbo/Pem/Pem for 2nd line treatment. Cycle 1 Day 1   12) 04/29/2020: Cycle 2 Day 1 Carbo/Pem/Pem  13) 05/20/2020: Cycle 3 Day 1 Carbo/Pem/Pem  14) 06/09/2020: Cycle 4 Day 1 Carbo/Pem/Pem  15) 06/29/2020: Cycle 5 Day 1 maintenance Pem/Pem  16) 07/20/2020: Cycle 6 Day 1 maintenance Pem/Pem  17) 08/12/2020: Cycle 7 Day 1 maintenance Pem/Pem  18) 09/01/2020: Cycle 8 Day 1 maintenance Pem/Pem  19) 09/21/2020: Cycle 9 Day 1 maintenance Pem/Pem  20) 10/13/2020: Cycle 10 Day 1 maintenance Pem/Pem  21) 11/03/2020: Cycle 11 Day 1 maintenance Pem/Pem  22) 11/25/2020:  Cycle 12 Day 1 maintenance Pem/Pem 23) 12/15/2020:  Cycle 13 Day 1 maintenance Pem/Pem 24) 01/05/2021:  Cycle 14 Day 1 maintenance Pem/Pem 25) 01/26/2021:  Cycle 15 Day 1 maintenance Pem/Pem plus Bevacizumab.  02/17/2021: Cycle 16 Day 1 maintenance Pem/Pem plus Bevacizumab.  03/10/2021: Cycle 17 Day 1 maintenance Pem/Pem plus Bevacizumab.  03/31/2021: Cycle 18 Day 1 maintenance Pem/Pem  04/20/2021:  Cycle 19 Day 1 maintenance Pem/Pem  05/12/2021: Cycle 20 Day 1 maintenance Pem/Pem 06/01/2021: Cycle 21 Day 1 maintenance Pem/Pem 06/22/2021: Cycle 22 Day 1 maintenance Pem/Pem 07/13/2021: Cycle 23 Day 1 maintenance Pem/Pem 08/16/2021: Cycle 24 Day 1 maintenance Pem/Pem 09/08/2021: Cycle 25 Day 1 maintenance Pem/Pem plus Bevacizumab 09/28/2021: Cycle 26 Day 1 maintenance Pem/Pem plus Bevacizumab 10/20/2021: Cycle 27 Day 1 maintenance Pem/Pem plus Bevacizumab 11/09/2021: Cycle 28 Day 1 maintenance Pem/Pem  12/06/2021: Cycle 29 Day 1 maintenance Pem/Pem  12/27/2021: Cycle 30 Day 1 maintenance Pem/Pem  01/18/2022: Cycle 31 Day 1 maintenance Pem/Pem  02/07/2022: Cycle 32 Day  1 maintenance Pem/Pem  02/28/2022: Cycle 33 Day 1 maintenance Pem/Pem  03/23/2022: Cycle 34 Day 1 maintenance Pem/Pem   04/13/2022: Cycle 35 Day 1 maintenance Pem/Pem  05/04/2022: Cycle 36 Day 1 maintenance Pem/Pem  05/25/2022: Cycle 37 Day 1 maintenance Pem/Pem  06/14/2022: Cycle 38 Day 1 maintenance Pem/Pem 07/05/2022: Cycle 39 Day 1 maintenance Pem/Pem 08/13/2022: Cycle 40 Day 1 maintenance Pem/Pem 09/06/2022:Cycle 41 Day 1 maintenance Pem/Pem 09/27/2022: Cycle 42 Day 1 maintenance Pem/Pem 10/19/2022: Cycle 43 Day 1 maintenance Pem/Pem  Interval History:  Virginia [name] 67 y.o. female with medical history significant for metastatic adenocarcinoma of the lung presents for a follow up visit. The patient's last visit was on 10/19/2022.  In the interim since the last visit she has continued on maintenance Pem/Pem.   On exam today Virginia [name] reports she has been "feeling the same".  She is been working with physical therapy.  She notes that her dizziness is persistent and has had no major changes in it.  She reports that she has been doing exercises with her eyes but sometimes they make her "fall asleep".  She reports that she is having some difficulty with her narcotic medications as result to switching insurance.  She continues to take approximately 1 oxycodone in the morning with some occasional extras for breakthrough.  Overall she is willing to tolerate her Pentam chemotherapy treatment and is not having any major side effects.  She otherwise denies any changes in her health.  She denies any fevers, chills, sweats, vomiting, diarrhea.  Full 10 point ROS was otherwise negative  MEDICAL HISTORY:  Past Medical History:  Diagnosis Date   Anemia    Anxiety    Concussion 09/28/2019   DVT (deep venous thrombosis) ([place]) 2021   L leg   Dyspnea    GERD (gastroesophageal reflux disease)    Hypercholesterolemia    per pt, she does not have elevated lipids   Hypertension    met lung ca dx'd 09/2019   mets to spine, hip and brain   PONV (postoperative nausea and vomiting)    Tobacco abuse     SURGICAL HISTORY: Past  Surgical History:  Procedure Laterality Date   ABDOMINAL HYSTERECTOMY     partial/ left ovaries   APPLICATION OF CRANIAL NAVIGATION N/A 12/02/2020   Procedure: APPLICATION OF CRANIAL NAVIGATION;  Surgeon: Erline Levine, MD;  Location: [facility];  Service: Neurosurgery;  Laterality: N/A;   CHOLECYSTECTOMY     CRANIOTOMY N/A 12/02/2020   Procedure: Posterior fossa craniotomy for tumor resection with brainlab;  Surgeon: Erline Levine, MD;  Location: [facility];  Service: Neurosurgery;  Laterality: N/A;   DILATION AND CURETTAGE OF UTERUS     IR IMAGING GUIDED PORT INSERTION  10/23/2019   IR PATIENT EVAL TECH 0-60 MINS  11/21/2021   IR PATIENT EVAL TECH 0-60 MINS  11/24/2021   IR PATIENT EVAL TECH 0-60 MINS  11/29/2021   IR PATIENT EVAL TECH 0-60 MINS  12/04/2021   IR PATIENT EVAL TECH 0-60 MINS  12/12/2021   IR PATIENT EVAL TECH 0-60 MINS  12/19/2021   IR PATIENT EVAL TECH 0-60 MINS  12/27/2021   IR PATIENT EVAL TECH 0-60 MINS  01/03/2022   IR PATIENT EVAL TECH 0-60 MINS  01/10/2022   IR PATIENT EVAL TECH 0-60 MINS  01/18/2022   IR PATIENT EVAL TECH 0-60 MINS  02/13/2022   IR PATIENT EVAL TECH 0-60 MINS  02/20/2022   IR RADIOLOGIST EVAL & MGMT  01/23/2022  IR REMOVAL TUN ACCESS W/ PORT W/O FL MOD SED  11/17/2021   KYPHOPLASTY N/A 03/15/2020   Procedure: Thoracic Eight KYPHOPLASTY;  Surgeon: Erline Levine, MD;  Location: [facility];  Service: Neurosurgery;  Laterality: N/A;  prone    LIPOMA EXCISION  2018   removed under left breast and right thigh.   TUBAL LIGATION      ALLERGIES:  has no allergies on file.  MEDICATIONS:  Current Outpatient Medications  Medication Sig Dispense Refill   acetaminophen (TYLENOL) 500 MG tablet Take 500 mg by mouth 2 (two) times daily as needed for moderate pain.     albuterol (VENTOLIN HFA) 108 (90 Base) MCG/ACT inhaler INHALE 2 PUFFS INTO THE LUNGS EVERY 6 HOURS AS NEEDED FOR WHEEZING OR SHORTNESS OF BREATH 6.7 g 11   ALPRAZolam (XANAX) 0.25 MG tablet Take 1 tablet (0.25 mg total) by  mouth 2 (two) times daily as needed for anxiety 60 tablet 0   amLODipine (NORVASC) 10 MG tablet Take 1 tablet (10 mg total) by mouth daily. 90 tablet 3   Cyanocobalamin (VITAMIN B12 PO) Take 1 tablet by mouth daily. Gummies     EPINEPHrine 0.3 mg/0.3 mL IJ SOAJ injection Inject 0.3 mg into the muscle as needed for anaphylaxis. 2 each 0   folic acid (FOLVITE) 1 MG tablet TAKE 1 TABLET BY MOUTH DAILY 90 tablet 1   furosemide (LASIX) 20 MG tablet Take 2 tablets (40 mg total) by mouth 2 times daily. 360 tablet 0   Glucosamine HCl (GLUCOSAMINE PO) Take 2 tablets by mouth daily.     lidocaine-prilocaine (EMLA) cream Apply to port as needed. 30 g 0   LYSINE PO Take 1 tablet by mouth daily.     meclizine (ANTIVERT) 25 MG tablet Take 1 tablet (25 mg total) by mouth 3 (three) times daily as needed for dizziness. 30 tablet 0   Multiple Vitamin (MULTIVITAMIN WITH MINERALS) TABS tablet Take 1 tablet by mouth daily.     Multiple Vitamins-Minerals (AIRBORNE PO) Take 1 tablet by mouth daily.      OLANZapine (ZYPREXA) 2.5 MG tablet TAKE 1 TABLET BY MOUTH AT BEDTIME 90 tablet 1   ondansetron (ZOFRAN) 4 MG tablet TAKE 1 TABLET(4 MG) BY MOUTH EVERY 8 HOURS AS NEEDED FOR NAUSEA OR VOMITING 40 tablet 2   OVER THE COUNTER MEDICATION Take 2 tablets by mouth daily. Beet root     oxyCODONE (OXY IR/ROXICODONE) 5 MG immediate release tablet TAKE 1 TO 2 TABLETS BY MOUTH EVERY 4 HOURS AS NEEDED FOR SEVERE PAIN 30 tablet 0   pantoprazole (PROTONIX) 40 MG tablet Take 1 tablet (40 mg total) by mouth daily. 90 tablet 3   polyethylene glycol (MIRALAX / GLYCOLAX) 17 g packet Take 17 g by mouth every evening.     potassium chloride SA (KLOR-CON M) 20 MEQ tablet Take 2 tablets (40 mEq total) by mouth 2 (two) times daily. 360 tablet 0   Tetrahydrozoline HCl (VISINE OP) Place 1 drop into both eyes daily as needed (irritation).     VITAMIN D PO Take 1 capsule by mouth daily.     vitamin E 180 MG (400 UNITS) capsule Take 400 IU daily x  1 week, then 400 IU BID (Patient taking differently: Take 400 Units by mouth daily.) 60 capsule 4   XTAMPZA ER 13.5 MG C12A Take 1 capsule by mouth in the morning and at bedtime. (every 12 hours) 60 capsule 0   No current facility-administered medications for this visit.  REVIEW OF SYSTEMS:   Constitutional: ( - ) fevers, ( - )  chills , ( - ) night sweats Eyes: ( - ) blurriness of vision, ( - ) double vision, ( - ) watery eyes Ears, nose, mouth, throat, and face: ( - ) mucositis, ( - ) sore throat Respiratory: ( - ) cough, ( + ) dyspnea, ( - ) wheezes Cardiovascular: ( - ) palpitation, ( - ) chest discomfort, ( - ) lower extremity swelling Gastrointestinal:  ( + ) nausea, ( - ) heartburn, ( - ) change in bowel habits Skin: ( - ) abnormal skin rashes Lymphatics: ( - ) new lymphadenopathy, ( - ) easy bruising Neurological: ( - ) numbness, ( - ) tingling, ( - ) new weaknesses Behavioral/Psych: ( - ) mood change, ( - ) new changes  All other systems were reviewed with the patient and are negative.  PHYSICAL EXAMINATION: ECOG PERFORMANCE STATUS: 1 - Symptomatic but completely ambulatory  Vitals:   11/09/22 1112 11/09/22 1132  BP: (!) 126/99 (!) 120/92  Pulse: 94   Resp: 15   Temp: 98.3 F (36.8 C)   SpO2: 99%    Filed Weights   11/09/22 1112  Weight: 140 lb 11.2 oz (63.8 kg)    GENERAL: well appearing middle aged Caucasian female in NAD  SKIN: skin color, texture, turgor are normal, no rashes or significant lesions EYES: conjunctiva are pink and non-injected, sclera clear LUNGS: wheezing at lung bases. clear to auscultation and percussion with normal breathing effort HEART: regular rate & rhythm and no murmurs and bilateral trace pitting lower extremity edema Musculoskeletal: no cyanosis of digits and no clubbing PSYCH: alert & oriented x 3, fluent speech NEURO: no focal motor/sensory deficits  LABORATORY DATA:  I have reviewed the data as listed    Latest Ref Rng &  Units 11/09/2022   10:42 AM 10/19/2022    9:00 AM 09/27/2022    1:38 PM  CBC  WBC 4.0 - 10.5 K/uL 5.6  6.3  8.2   Hemoglobin 12.0 - 15.0 g/dL 13.9  14.4  13.2   Hematocrit 36.0 - 46.0 % 40.3  40.9  38.1   Platelets 150 - 400 K/uL 399  402  408        Latest Ref Rng & Units 11/09/2022   10:42 AM 10/19/2022    9:00 AM 09/27/2022    1:38 PM  CMP  Glucose 70 - 99 mg/dL 97  104  93   BUN 8 - 23 mg/dL '14  8  11   '$ Creatinine 0.44 - 1.00 mg/dL 0.80  0.72  0.67   Sodium 135 - 145 mmol/L 140  139  140   Potassium 3.5 - 5.1 mmol/L 3.4  3.3  3.7   Chloride 98 - 111 mmol/L 100  101  103   CO2 22 - 32 mmol/L '28  29  30   '$ Calcium 8.9 - 10.3 mg/dL 9.1  9.4  9.3   Total Protein 6.5 - 8.1 g/dL 7.8  7.3  6.8   Total Bilirubin 0.3 - 1.2 mg/dL 0.6  0.5  0.4   Alkaline Phos 38 - 126 U/L 56  61  57   AST 15 - 41 U/L '26  21  20   '$ ALT 0 - 44 U/L '17  12  11      '$ RADIOGRAPHIC STUDIES: I personally have viewed the radiographic studies below: No results found.   ASSESSMENT & PLAN Virginia [name] [id number]  y.o. female with medical history significant for metastatic adenocarcinoma of the lung presents for a follow up visit. 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   Previously we discussed Carboplatin/Pembrolizumab/Pemetrexed and the expected side effect from these medications.  We discussed the possible side effects of immunotherapy including colitis, hepatitis, dermatitis, and pneumonitis.  Additionally we discussed the side effects of carboplatin which include suppression of blood counts, nephrotoxicity, and nausea.  Additionally we discussed pemetrexed which can also have effects on counts and would require supplementation with vitamin 123456 and folic acid.  She voiced her understanding of these side effects and treatment plans moving forward.  I also noted that we would be able to drop the chemotherapy agents that she had intolerance to continue with pembrolizumab alone given her  high TPS score.  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 Due to worsening dizziness, she resumed this from Cycle 25-27.  PLAN: --Labs from today were reviewed and adequate for treatment.  Labs today show creatinine 0.80, white blood cell 5.6, hemoglobin 13.9, MCV 97.6, and platelets of 399 --repeat CT CAP on 10/11/2022 showed stable disease with no evidence of progression. Next due in May 2024.  --Continue with Cycle 44, Day 1 of maintenance Pem/Pem. --RTC q 3 weeks for labs, follow up visit prior to Cycle 45.    #Nausea/Vomiting, improving -- provided patient with Zofran 4-'8mg'$  q8h PRN and compazine '10mg'$  q6H for breakthrough PRN.  She is currently using Phenergan for breakthrough. --continue olanzpaine 2.5 mg PO QHS to help with nausea. Marked improvement since the addition of this medication on 01/29/2020.  --continue to monitor   #Hypokalemia, stable --likely 2/2 to decreased PO intake, hypovolemia, and BP medications --Potassium level 3.4 today.   Recommend that she continue oral potassium chloride with treatment --Currently on potassium chloride tablets, 40 mEq PO potassium in AM and 20mq in PM.   #Pain Control, stable --continue oxycodone '5mg'$  q4H PRN. Can increase dose to '10mg'$  q6H if pain is severe. She is taking approximately 2-3 pills per day.  --continue Xtampza 13.5 mg ER BID for long acting support.  --continue to take with Senokot to prevent opioid induced constipation. 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 Last 01/18/2022, next dose currently due.  --nausea as above    #Brain Metastasis, stable --appreciate the assistance of Dr. VMickeal Skinnerin treating her brain  metastasis and symptoms. --radiation therapy to the brain completed on 11/16/2019.  --MRI brain on 04/29/2020 showed evidence of small brain bleed. Eliquis therapy was discontinued.  --MRI on 11/04/2020 showed concern for progression of intracranial disease.  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  --MRI on 12/23/2020 showed evidence of radiation necrosis. Dr. VMickeal Skinnerevaluated the patient on 01/02/2021 with recommendations to add IV Bevacizumab 10 mg/kg q 3 weeks ( started 01/26/2021). Completed a full course of this therapy. --Patient was evaluated by Dr. VMickeal Skinneron 08/29/2021 and recommended to resume Bevacizumab due to worsening dizziness.  Which was given from Cycle 25-27.  Plan: --continue to follow with Dr. VMickeal Skinner --Avastin added back in the patient's chemotherapy regimen. Will restart in 4  weeks from today (out from dental procedure).  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    # Hypertension: --Continue Norvasc to 10 mg daily.  --continue to monitor at home and in clinic.    Orders Placed This Encounter  Procedures   TSH    Standing Status:   Future    Standing Expiration Date:   03/14/2024   CBC with Differential ([employer] Only)    Standing Status:   Future    Standing Expiration Date:   03/14/2024   CMP ([place] only)    Standing Status:   Future    Standing Expiration Date:   03/14/2024    All questions were answered. The patient knows to call the clinic with any problems, questions or concerns.  I have spent a total of 30 minutes minutes of face-to-face and non-face-to-face time, preparing to see the patient, performing a medically appropriate examination,  counseling and educating the patient,  documenting clinical information in the electronic health record, and care coordination.   Ledell Peoples, MD Department of Hematology/Oncology [place] [facility] Phone: [phone] Pager: [phone] Email: [email or web address]   11/10/2022 6:45 PM   Literature Support:  Lorenza Chick, Rodrguez-Abreu D, Duffy Rhody, Felip E, De Angelis F, Domine M, [place], Hochmair MJ, [place], [place], Bischoff HG, [place], Grossi F, [place], Reck M, [place], [place], [place], Rubio-Viqueira B, Novello S, Kurata T, Gray JE, Vida J, Wei Z, Yang J, Raftopoulos H, [place], [place] [employer]; [id number] Investigators. Pembrolizumab plus Chemotherapy in Metastatic Non-Small-Cell Lung Cancer. [place] Med. 2018 May 31;378(22):2078-2092.  --Median progression-free survival was 8.8 months (95% CI, 7.6 to 9.2) in the pembrolizumab-combination group and 4.9 months (95% CI, 4.7 to 5.5) in the placebo-combination group (hazard ratio for disease progression or death, 0.52; 95% CI, 0.43 to 0.64; P<0.001). Adverse events of grade 3 or higher occurred in 67.2% of the patients in the pembrolizumab-combination group and in 65.8% of those in the placebo-combination group.  Jodene Nam L. Efficacy of pemetrexed-based regimens in advanced non-small cell lung cancer patients with activating epidermal growth factor receptor mutations after tyrosine kinase inhibitor failure: a systematic review. Onco Targets Ther. 2018;11:2121-2129.   --The weighted median PFS, median OS, and ORR for patients treated with pem regimens were 5.09 months, 15.91 months, and 30.19%, respectively. Our systematic review results showed a favorable efficacy profile of pem regimens in NSCLC patients with EGFR mutation after EGFR-TKI failure.

## 2022-11-13 ENCOUNTER — Ambulatory Visit: Payer: Medicare Other | Admitting: Physical Therapy

## 2022-11-15 ENCOUNTER — Telehealth: Payer: Self-pay | Admitting: Physical Therapy

## 2022-11-15 NOTE — Telephone Encounter (Signed)
Received VM from patient's husband Virginia [name]. Called back and answered question about [employer] HEP. Explained that did not need to record on cite, patient's response to each exercise. Caregiver verbalized understanding.  Virginia [name], PT, DPT

## 2022-11-20 ENCOUNTER — Encounter: Payer: Self-pay | Admitting: Physical Therapy

## 2022-11-20 ENCOUNTER — Ambulatory Visit: Payer: Medicare Other | Attending: Internal Medicine | Admitting: Physical Therapy

## 2022-11-20 VITALS — BP 124/94 | HR 108

## 2022-11-20 DIAGNOSIS — R42 Dizziness and giddiness: Secondary | ICD-10-CM | POA: Insufficient documentation

## 2022-11-20 NOTE — Therapy (Signed)
OUTPATIENT PHYSICAL THERAPY VESTIBULAR TREATMENT   Patient Name: Virginia [name] MRN: [id number] DOB:09-12-1955, 67 y.o., female Today's Date: 11/20/2022  END OF SESSION:  PT End of Session - 11/20/22 1234     Visit Number 3    Number of Visits 7    Date for PT Re-Evaluation 12/18/22    Authorization Type Medicare    PT Start Time 1233    PT Stop Time 1315    PT Time Calculation (min) 42 min    Equipment Utilized During Treatment Other (comment)   session performed at seated level, no gait belt provided today   Activity Tolerance Patient tolerated treatment well    Behavior During Therapy [facility] for tasks assessed/performed             Past Medical History:  Diagnosis Date   Anemia    Anxiety    Concussion 09/28/2019   DVT (deep venous thrombosis) ([place]) 2021   L leg   Dyspnea    GERD (gastroesophageal reflux disease)    Hypercholesterolemia    per pt, she does not have elevated lipids   Hypertension    met lung ca dx'd 09/2019   mets to spine, hip and brain   PONV (postoperative nausea and vomiting)    Tobacco abuse    Past Surgical History:  Procedure Laterality Date   ABDOMINAL HYSTERECTOMY     partial/ left ovaries   APPLICATION OF CRANIAL NAVIGATION N/A 12/02/2020   Procedure: APPLICATION OF CRANIAL NAVIGATION;  Surgeon: Erline Levine, MD;  Location: [place];  Service: Neurosurgery;  Laterality: N/A;   CHOLECYSTECTOMY     CRANIOTOMY N/A 12/02/2020   Procedure: Posterior fossa craniotomy for tumor resection with brainlab;  Surgeon: Erline Levine, MD;  Location: [place];  Service: Neurosurgery;  Laterality: N/A;   DILATION AND CURETTAGE OF UTERUS     IR IMAGING GUIDED PORT INSERTION  10/23/2019   IR PATIENT EVAL TECH 0-60 MINS  11/21/2021   IR PATIENT EVAL TECH 0-60 MINS  11/24/2021   IR PATIENT EVAL TECH 0-60 MINS  11/29/2021   IR PATIENT EVAL TECH 0-60 MINS  12/04/2021   IR PATIENT EVAL TECH 0-60 MINS  12/12/2021   IR PATIENT EVAL TECH 0-60 MINS  12/19/2021   IR  PATIENT EVAL TECH 0-60 MINS  12/27/2021   IR PATIENT EVAL TECH 0-60 MINS  01/03/2022   IR PATIENT EVAL TECH 0-60 MINS  01/10/2022   IR PATIENT EVAL TECH 0-60 MINS  01/18/2022   IR PATIENT EVAL TECH 0-60 MINS  02/13/2022   IR PATIENT EVAL TECH 0-60 MINS  02/20/2022   IR RADIOLOGIST EVAL & MGMT  01/23/2022   IR REMOVAL TUN ACCESS W/ PORT W/O FL MOD SED  11/17/2021   KYPHOPLASTY N/A 03/15/2020   Procedure: Thoracic Eight KYPHOPLASTY;  Surgeon: Erline Levine, MD;  Location: [place];  Service: Neurosurgery;  Laterality: N/A;  prone    LIPOMA EXCISION  2018   removed under left breast and right thigh.   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 ([place]) 03/15/2020   Goals of care, counseling/discussion 02/25/2020  Palliative care patient 12/16/2019   Malignant neoplasm metastatic to bone ([place]) 10/21/2019   Malignant neoplasm metastatic to brain ([place]) 10/15/2019   Right lower lobe lung mass 09/25/2019   COPD (chronic obstructive pulmonary disease) ([place]) 09/25/2019   Impaired glucose tolerance 12/08/2017   Vitamin D deficiency 12/08/2017   Microscopic hematuria 04/01/2014   Hypertension 01/11/2012   Palpitations 01/11/2012   Hyperlipidemia 12/25/2011   History of smoking 12/25/2011    PCP: Elby Showers, MD REFERRING PROVIDER: Ventura Sellers, MD  REFERRING DIAG: C79.31 (ICD-10-CM) - Metastasis to brain [facility]) I67.89,Y84.2 (ICD-10-CM) - Radiation therapy induced brain necrosis R42 (ICD-10-CM) - Vertigo  THERAPY DIAG:  Dizziness and giddiness  ONSET DATE: 10/15/2022  Rationale for Evaluation and Treatment: Rehabilitation  SUBJECTIVE:    SUBJECTIVE STATEMENT: Patient arrives to clinic with rollator (usually in transport chair). Patient has been doing her exercises 1-2 x a day. Horizontal VOR x 1 has improved per patient and spouse and she needs less time to recover between reps.   Pt accompanied by: significant other - Randy husband  PERTINENT HISTORY: compression fracture in spine due to metastases with pain medication management from codeine, lung cancer with mestatis diagnosed around January 2021; vertigo, hypertension  PAIN:  Are you having pain? No  PRECAUTIONS: Fall (thoracic spine fracture due to mets)  WEIGHT BEARING RESTRICTIONS: No  FALLS: Has patient fallen in last 6 months? Yes. Number of falls 1 fall reaching for toilet paper  LIVING ENVIRONMENT: Lives with: lives with their spouse Lives in: House/apartment Stairs: Yes: Internal: 5 steps; on left going up and External: 14 steps; on left going up Has following equipment at home: Environmental consultant - 4 wheeled, Wheelchair (manual), Manufacturing engineer  PLOF: Independent  PATIENT GOALS: "I want to be able to drive, walk, and take a shower again."  OBJECTIVE:   DIAGNOSTIC FINDINGS:   IMPRESSION per MR Brain 10/11/2022: Stable exam since 08/10/2022 with unchanged patchy enhancement in the bilateral cerebellar hemispheres and unchanged right parietal and occipital lesions. No new lesions or worsening FLAIR signal abnormality. The small bilateral subdural collections are slightly decreased in size since 08/10/2022.  IMPRESSION CT CHEST ABDOMEN PELVIS W/ CONTRAST: Overall stable appearance with bandlike opacity involving the right lower lobe extending to the hilum. Scattered small ground-glass type nodules in both lungs. Stable pericardial effusion. No bowel obstruction, free air or free fluid. Scattered skeletal sclerotic metastases. Once again there is augmentation cement at T8   COGNITION: Overall cognitive status: Impaired - reports increased difficulty  focusing but [facility] for answering most questions during session    VESTIBULAR TREATMENT:                                                                                                    Session completed seated in transport chair  VOR x 1 viewing seated plain background 2 x 60"  - horizontal (4/5 dizziness - recovers quickly)  - vertical (3-4/5 - recovers quickly) Quarter head turns to target for fixation   - to the R horizontal x 10  - to the R down x  10   - to the R up x 10 Quarter head turns to target for fixation   - to the L horizontal x 10  - to the L down x 10   - to the L up x 10 Spot it for saccades with head horizontal/diagonal turns for vestibular challenge (one card in hand and one mirror) x 10 reps bil   - tolerates well without major increase in symptoms   PATIENT EDUCATION: Education details: Continue HEP + use of Spot it at home for vestibular / saccades challenge Person educated: Patient and Spouse Education method: Explanation Education comprehension: verbalized understanding and needs further education  HOME EXERCISE PROGRAM:  Access Code: [email or web address] URL: [email or web address] Date: 11/06/2022 Prepared by: Malachi Carl  Exercises - Seated Horizontal Saccades  - 1 x daily - 7 x weekly - 3 sets - 30 hold - Seated Diagonal Saccades  - 1 x daily - 7 x weekly - 3 sets - 30 hold - Seated Vertical Saccades  - 1 x daily - 7 x weekly - 3 sets - 30 hold - Seated Gaze Stabilization with Head Nod  - 1 x daily - 7 x weekly - 3 sets - 30 hold - Seated Gaze Stabilization with Head Rotation  - 1 x daily - 7 x weekly - 3 sets - 30 hold  GOALS: Goals reviewed with patient? Yes  LONG TERM GOALS: (STG = LTG given POC exams) Target date: 12/04/2022  Patient and patient caregiver will report demonstrate modified independence with final HEP in order to maintain current gains and continue to progress after physical therapy discharge.   Baseline: To be provided Goal  status: INITIAL  2.  Patient will improve MSQ score by 4 points to indicate reduction in motion sensitivity in order to progress towards baseline function.   Baseline: 15/30 Goal status: INITIAL  3. Patient will improve FOTO score to 51 or greater to indicate anticipated functional gains to reduce impairment due to dizziness. Baseline: 39.1 Goal status: INITIAL  ASSESSMENT:  CLINICAL IMPRESSION: Patient's tolerance for session was significantly improved form when last assessed, requiring fewer rest breaks, shorter recovery periods, and longer duration of tolerance for VORx1 viewing exercises. 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 Patient will benefit from skilled physical therapy services to continue to improve habituation/compensatory strategies to address impairments in order to achieve maximal gains.  OBJECTIVE IMPAIRMENTS: decreased balance, decreased cognition, decreased endurance, decreased mobility, difficulty walking, decreased ROM, decreased strength, dizziness, increased edema, impaired UE functional use, and pain.   ACTIVITY LIMITATIONS: carrying, lifting, bending, sitting, standing, squatting, sleeping, stairs, transfers, bed mobility, bathing, reach over head, and caring for others  PARTICIPATION LIMITATIONS: meal prep, laundry, interpersonal relationship, driving, and community activity  PERSONAL FACTORS: Past/current experiences and 3+ comorbidities: see above  are also affecting patient's functional outcome.   REHAB POTENTIAL: Fair given central findings and progressive  CLINICAL DECISION MAKING: Evolving/moderate complexity  EVALUATION COMPLEXITY: Moderate   PLAN:  PT FREQUENCY: 1x/week  PT DURATION: 8 weeks  PLANNED INTERVENTIONS: Therapeutic exercises, Therapeutic activity, Neuromuscular re-education, Balance training, Gait  training, Patient/Family education, Self Care, Vestibular training, Canalith repositioning, Manual therapy, and Re-evaluation  PLAN FOR NEXT SESSION:  compensatory stragies, VOR/smooth pursuits as tolerated, review initial HEP, components of MSQ, rollator walking with head turns in hallway, standing balance with head turns corner balance (diagonals), HART chart  Esperanza Heir,  PT, DPT 11/20/2022, 1:34 PM

## 2022-11-22 ENCOUNTER — Other Ambulatory Visit: Payer: Self-pay | Admitting: Internal Medicine

## 2022-11-22 ENCOUNTER — Other Ambulatory Visit: Payer: Self-pay | Admitting: Hematology and Oncology

## 2022-11-23 ENCOUNTER — Encounter: Payer: Self-pay | Admitting: Hematology and Oncology

## 2022-11-23 MED ORDER — OXYCODONE HCL 5 MG PO TABS: ORAL_TABLET | ORAL | 0 refills | Status: DC

## 2022-11-23 MED ORDER — ALPRAZOLAM 0.25 MG PO TABS: 0.2500 mg | ORAL_TABLET | Freq: Two times a day (BID) | ORAL | 0 refills | Status: DC | PRN

## 2022-11-26 ENCOUNTER — Ambulatory Visit: Payer: Medicare Other | Admitting: Physical Therapy

## 2022-11-29 ENCOUNTER — Ambulatory Visit: Payer: Medicare Other | Admitting: Hematology and Oncology

## 2022-11-29 ENCOUNTER — Ambulatory Visit: Payer: Medicare Other

## 2022-11-29 ENCOUNTER — Other Ambulatory Visit: Payer: Medicare Other

## 2022-11-30 ENCOUNTER — Inpatient Hospital Stay: Payer: Medicare Other

## 2022-11-30 ENCOUNTER — Inpatient Hospital Stay (HOSPITAL_BASED_OUTPATIENT_CLINIC_OR_DEPARTMENT_OTHER): Payer: Medicare Other | Admitting: Hematology and Oncology

## 2022-11-30 DIAGNOSIS — R918 Other nonspecific abnormal finding of lung field: Secondary | ICD-10-CM

## 2022-11-30 DIAGNOSIS — C7931 Secondary malignant neoplasm of brain: Secondary | ICD-10-CM

## 2022-11-30 DIAGNOSIS — C3431 Malignant neoplasm of lower lobe, right bronchus or lung: Secondary | ICD-10-CM | POA: Diagnosis not present

## 2022-11-30 LAB — CBC WITH DIFFERENTIAL (CANCER CENTER ONLY)
Abs Immature Granulocytes: 0.03 10*3/uL (ref 0.00–0.07)
Basophils Absolute: 0 10*3/uL (ref 0.0–0.1)
Basophils Relative: 1 %
Eosinophils Absolute: 0.2 10*3/uL (ref 0.0–0.5)
Eosinophils Relative: 2 %
HCT: 36.5 % (ref 36.0–46.0)
Hemoglobin: 12.6 g/dL (ref 12.0–15.0)
Immature Granulocytes: 0 %
Lymphocytes Relative: 37 %
Lymphs Abs: 2.5 10*3/uL (ref 0.7–4.0)
MCH: 34.2 pg — ABNORMAL HIGH (ref 26.0–34.0)
MCHC: 34.5 g/dL (ref 30.0–36.0)
MCV: 99.2 fL (ref 80.0–100.0)
Monocytes Absolute: 0.9 10*3/uL (ref 0.1–1.0)
Monocytes Relative: 13 %
Neutro Abs: 3.1 10*3/uL (ref 1.7–7.7)
Neutrophils Relative %: 47 %
Platelet Count: 383 10*3/uL (ref 150–400)
RBC: 3.68 MIL/uL — ABNORMAL LOW (ref 3.87–5.11)
RDW: 14.9 % (ref 11.5–15.5)
WBC Count: 6.7 10*3/uL (ref 4.0–10.5)
nRBC: 0 % (ref 0.0–0.2)

## 2022-11-30 LAB — CMP (CANCER CENTER ONLY)
ALT: 16 U/L (ref 0–44)
AST: 22 U/L (ref 15–41)
Albumin: 3.6 g/dL (ref 3.5–5.0)
Alkaline Phosphatase: 60 U/L (ref 38–126)
Anion gap: 8 (ref 5–15)
BUN: 12 mg/dL (ref 8–23)
CO2: 28 mmol/L (ref 22–32)
Calcium: 8.8 mg/dL — ABNORMAL LOW (ref 8.9–10.3)
Chloride: 103 mmol/L (ref 98–111)
Creatinine: 0.73 mg/dL (ref 0.44–1.00)
GFR, Estimated: >60 mL/min (ref >60)
Glucose, Bld: 106 mg/dL — ABNORMAL HIGH (ref 70–99)
Potassium: 3.4 mmol/L — ABNORMAL LOW (ref 3.5–5.1)
Sodium: 139 mmol/L (ref 135–145)
Total Bilirubin: 0.5 mg/dL (ref 0.3–1.2)
Total Protein: 7.1 g/dL (ref 6.5–8.1)

## 2022-11-30 LAB — TSH: TSH: 2.858 u[IU]/mL (ref 0.350–4.500)

## 2022-11-30 MED ORDER — PROCHLORPERAZINE MALEATE 10 MG PO TABS
10.0000 mg | ORAL_TABLET | Freq: Once | ORAL | Status: AC
  Administered 2022-11-30: 10 mg via ORAL
  Filled 2022-11-30: qty 1

## 2022-11-30 MED ORDER — SODIUM CHLORIDE 0.9 % IV SOLN
500.0000 mg/m2 | Freq: Once | INTRAVENOUS | Status: AC
  Administered 2022-11-30: 900 mg via INTRAVENOUS
  Filled 2022-11-30: qty 36

## 2022-11-30 MED ORDER — SODIUM CHLORIDE 0.9 % IV SOLN
200.0000 mg | Freq: Once | INTRAVENOUS | Status: AC
  Administered 2022-11-30: 200 mg via INTRAVENOUS
  Filled 2022-11-30: qty 8

## 2022-11-30 MED ORDER — SODIUM CHLORIDE 0.9 % IV SOLN: Freq: Once | INTRAVENOUS | Status: AC

## 2022-11-30 NOTE — Progress Notes (Signed)
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 CT of the head showed showed concerning for metastatic disease involving the left cerebellum and right occipital lobe.  2) 09/30/2019: MRI brain confirms mass within the left cerebellum measures 1.6 x 1.3 x 1.5 cm as well as at least 8 metastatic lesions within the supratentorial and infratentorial brain as outlined 3) 10/01/2019: PET CT scan revealed hypermetabolic infrahilar right lower lobe mass with hypermetabolic nodal metastases in the right hilum, mediastinum, right supraclavicular region, left axilla and lower left neck. 4) 10/08/2019: US guided biopsy of the lymph nodes confirms poorly differentiated non-small cell carcinoma. Immunohistochemistry confirms adenocarcinoma of lung primary. PD-L1 and NGS later revealed a MET Exon 14 mutation and TPS of 90%.  5) 10/12/2019: Establish care with Dr. Lorenso [name]  6) 11/04/2019: Day 1 of capmatinib 427m BID 7) 11/09/2019: presented to Rad/Onc with a DVT in LLE. Seen in symptom management clinic and started on apixaban.  8) 12/29/2019: CT C/A/P showed interval decrease in size of mass involving the superior segment of right lower lobe and reduction in mediastinal and left supraclavicular adenopathy though some small spinal lesions were noted in the interim between the two sets of scans 9) 01/25/2020: temporarily stopped capmatinib due to symptoms of nausea/fatigue. Restarted therapy on 01/30/2020 after brief chemo holiday.  10)  03/22/2020: CT C/A/P reveals a mixed picture, with new lung nodule and increased lymphadenopathy, however response in bone lesions. D/c capmatinib therapy 11) 04/08/2020: start of Carbo/Pem/Pem for 2nd line treatment. Cycle 1 Day 1   12) 04/29/2020: Cycle 2 Day 1 Carbo/Pem/Pem  13) 05/20/2020: Cycle 3 Day 1 Carbo/Pem/Pem  14) 06/09/2020: Cycle 4 Day 1 Carbo/Pem/Pem  15) 06/29/2020: Cycle 5 Day 1 maintenance Pem/Pem  16) 07/20/2020: Cycle 6 Day 1 maintenance Pem/Pem  17) 08/12/2020: Cycle 7 Day 1 maintenance Pem/Pem  18) 09/01/2020: Cycle 8 Day 1 maintenance Pem/Pem  19) 09/21/2020: Cycle 9 Day 1 maintenance Pem/Pem  20) 10/13/2020: Cycle 10 Day 1 maintenance Pem/Pem  21) 11/03/2020: Cycle 11 Day 1 maintenance Pem/Pem  22) 11/25/2020:  Cycle 12 Day 1 maintenance Pem/Pem 23) 12/15/2020:  Cycle 13 Day 1 maintenance Pem/Pem 24) 01/05/2021:  Cycle 14 Day 1 maintenance Pem/Pem 25) 01/26/2021:  Cycle 15 Day 1 maintenance Pem/Pem plus Bevacizumab.  02/17/2021: Cycle 16 Day 1 maintenance Pem/Pem plus Bevacizumab.  03/10/2021: Cycle 17 Day 1 maintenance Pem/Pem plus Bevacizumab.  03/31/2021: Cycle 18 Day 1 maintenance Pem/Pem  04/20/2021:  Cycle 19 Day 1 maintenance Pem/Pem  05/12/2021: Cycle 20 Day 1 maintenance Pem/Pem 06/01/2021: Cycle 21 Day 1 maintenance Pem/Pem 06/22/2021: Cycle 22 Day 1 maintenance Pem/Pem 07/13/2021: Cycle 23 Day 1 maintenance Pem/Pem 08/16/2021: Cycle 24 Day 1 maintenance Pem/Pem 09/08/2021: Cycle 25 Day 1 maintenance Pem/Pem plus Bevacizumab 09/28/2021: Cycle 26 Day 1 maintenance Pem/Pem plus Bevacizumab 10/20/2021: Cycle 27 Day 1 maintenance Pem/Pem plus Bevacizumab 11/09/2021: Cycle 28 Day 1 maintenance Pem/Pem  12/06/2021: Cycle 29 Day 1 maintenance Pem/Pem  12/27/2021: Cycle 30 Day 1 maintenance Pem/Pem  01/18/2022: Cycle 31 Day 1 maintenance Pem/Pem  02/07/2022: Cycle 32 Day  1 maintenance Pem/Pem  02/28/2022: Cycle 33 Day 1 maintenance Pem/Pem  03/23/2022: Cycle 34 Day 1 maintenance Pem/Pem   04/13/2022: Cycle 35 Day 1 maintenance Pem/Pem  05/04/2022: Cycle 36 Day 1 maintenance Pem/Pem  05/25/2022: Cycle 37 Day 1 maintenance Pem/Pem  06/14/2022: Cycle 38 Day 1 maintenance Pem/Pem 07/05/2022: Cycle 39 Day 1 maintenance Pem/Pem 08/13/2022: Cycle 40 Day 1 maintenance Pem/Pem 09/06/2022:Cycle 41 Day 1 maintenance Pem/Pem 09/27/2022: Cycle 42 Day 1 maintenance Pem/Pem 10/19/2022: Cycle 43 Day 1 maintenance Pem/Pem 11/09/2022: Cycle 44 Day 1 maintenance Pem/Pem 11/30/2022: Cycle 45 Day 1 maintenance Pem/Pem  Interval History:  Virginia [name] 67 y.o. female with medical history significant for metastatic adenocarcinoma of the lung presents for a follow up visit. The patient's last visit was on 10/19/2022.  In the interim since the last visit she has continued on maintenance Pem/Pem.   On exam today Virginia [name] reports her health has been steady in the interim since her last visit 3 weeks ago.  She continues to have some dizziness which is mildly improved from prior.  She reports that she is "doing laps" by walking around the house.  She reports that she is undergoing physical therapy and has had multiple sessions.  These are exercises to help with her eyes and head movements.  She reports that her appetite has been good and her energy has been strong.  She reports also that her pain is under good control with her current pain regimen.  She notes that overall she is at her baseline level of health and willing and able to proceed with continued treatment today.  She otherwise denies any changes in her health.  She denies any fevers, chills, sweats, vomiting, diarrhea.  Full 10 point ROS was otherwise negative  MEDICAL HISTORY:  Past Medical History:  Diagnosis Date   Anemia    Anxiety    Concussion 09/28/2019   DVT (deep venous thrombosis) ([place]) 2021   L leg   Dyspnea    GERD (gastroesophageal reflux disease)    Hypercholesterolemia    per pt, she does not have elevated lipids   Hypertension     met lung ca dx'd 09/2019   mets to spine, hip and brain   PONV (postoperative nausea and vomiting)    Tobacco abuse     SURGICAL HISTORY: Past Surgical History:  Procedure Laterality Date   ABDOMINAL HYSTERECTOMY     partial/ left ovaries   APPLICATION OF CRANIAL NAVIGATION N/A 12/02/2020   Procedure: APPLICATION OF CRANIAL NAVIGATION;  Surgeon: Erline Levine, MD;  Location: [facility];  Service: Neurosurgery;  Laterality: N/A;   CHOLECYSTECTOMY     CRANIOTOMY N/A 12/02/2020   Procedure: Posterior fossa craniotomy for tumor resection with brainlab;  Surgeon: Erline Levine, MD;  Location: [facility];  Service: Neurosurgery;  Laterality: N/A;   DILATION AND CURETTAGE OF UTERUS     IR IMAGING GUIDED PORT INSERTION  10/23/2019   IR PATIENT EVAL TECH 0-60 MINS  11/21/2021   IR PATIENT EVAL TECH 0-60 MINS  11/24/2021   IR PATIENT EVAL TECH 0-60 MINS  11/29/2021   IR PATIENT EVAL TECH 0-60 MINS  12/04/2021   IR PATIENT EVAL TECH 0-60 MINS  12/12/2021   IR PATIENT EVAL TECH 0-60 MINS  12/19/2021   IR PATIENT EVAL TECH 0-60 MINS  12/27/2021   IR PATIENT EVAL TECH 0-60 MINS  01/03/2022   IR PATIENT EVAL TECH 0-60 MINS  01/10/2022   IR PATIENT EVAL TECH 0-60 MINS  01/18/2022   IR PATIENT EVAL TECH 0-60 MINS  02/13/2022   IR PATIENT EVAL TECH 0-60 MINS  02/20/2022   IR RADIOLOGIST EVAL & MGMT  01/23/2022   IR REMOVAL TUN ACCESS W/ PORT W/O FL MOD SED  11/17/2021   KYPHOPLASTY N/A 03/15/2020   Procedure: Thoracic Eight KYPHOPLASTY;  Surgeon: Erline Levine, MD;  Location: [facility];  Service: Neurosurgery;  Laterality: N/A;  prone    LIPOMA EXCISION  2018   removed under left breast and right thigh.   TUBAL LIGATION      ALLERGIES:  has No Known Allergies.  MEDICATIONS:  Current Outpatient Medications  Medication Sig Dispense Refill   acetaminophen (TYLENOL) 500 MG tablet Take 500 mg by mouth 2 (two) times daily as needed for moderate pain.     albuterol (VENTOLIN HFA) 108 (90 Base) MCG/ACT inhaler INHALE 2 PUFFS INTO  THE LUNGS EVERY 6 HOURS AS NEEDED FOR WHEEZING OR SHORTNESS OF BREATH 6.7 g 11   ALPRAZolam (XANAX) 0.25 MG tablet Take 1 tablet (0.25 mg total) by mouth 2 (two) times daily as needed for anxiety 60 tablet 0   amLODipine (NORVASC) 10 MG tablet Take 1 tablet (10 mg total) by mouth daily. 90 tablet 3   Cyanocobalamin (VITAMIN B12 PO) Take 1 tablet by mouth daily. Gummies     EPINEPHrine 0.3 mg/0.3 mL IJ SOAJ injection Inject 0.3 mg into the muscle as needed for anaphylaxis. 2 each 0   folic acid (FOLVITE) 1 MG tablet TAKE 1 TABLET BY MOUTH DAILY 90 tablet 1   furosemide (LASIX) 20 MG tablet Take 2 tablets (40 mg total) by mouth 2 times daily. 360 tablet 0   Glucosamine HCl (GLUCOSAMINE PO) Take 2 tablets by mouth daily.     lidocaine-prilocaine (EMLA) cream Apply to port as needed. 30 g 0   LYSINE PO Take 1 tablet by mouth daily.     meclizine (ANTIVERT) 25 MG tablet Take 1 tablet (25 mg total) by mouth 3 (three) times daily as needed for dizziness. 30 tablet 0   Multiple Vitamin (MULTIVITAMIN WITH MINERALS) TABS tablet Take 1 tablet by mouth daily.     Multiple Vitamins-Minerals (AIRBORNE PO) Take 1 tablet by mouth daily.      OLANZapine (ZYPREXA) 2.5 MG tablet TAKE 1 TABLET BY MOUTH AT BEDTIME 90 tablet 1   ondansetron (ZOFRAN) 4 MG tablet TAKE 1 TABLET(4 MG) BY MOUTH EVERY 8 HOURS AS NEEDED FOR NAUSEA OR VOMITING 40 tablet 2   OVER THE COUNTER MEDICATION Take 2 tablets by mouth daily. Beet root     oxyCODONE (OXY IR/ROXICODONE) 5 MG immediate release tablet TAKE 1 TO 2 TABLETS BY MOUTH EVERY 4 HOURS AS NEEDED FOR SEVERE PAIN 30 tablet 0   pantoprazole (PROTONIX) 40 MG tablet Take 1 tablet (40 mg total) by mouth daily. 90 tablet 3   polyethylene glycol (MIRALAX / GLYCOLAX) 17 g packet Take 17 g by mouth every evening.     potassium chloride SA (KLOR-CON M) 20 MEQ tablet Take 2 tablets (40 mEq total) by mouth 2 (two) times daily. 360 tablet 0   Tetrahydrozoline HCl (VISINE OP) Place 1 drop into  both eyes daily as needed (irritation).     VITAMIN D PO Take 1 capsule by mouth daily.     vitamin E 180 MG (400 UNITS) capsule Take 400 IU daily x 1 week, then 400 IU BID (Patient taking differently: Take 400 Units by mouth daily.) 60 capsule 4  XTAMPZA ER 13.5 MG C12A Take 1 capsule by mouth in the morning and at bedtime. (every 12 hours) 60 capsule 0   No current facility-administered medications for this visit.   Facility-Administered Medications Ordered in Other Visits  Medication Dose Route Frequency Provider Last Rate Last Admin   pembrolizumab (KEYTRUDA) 200 mg in sodium chloride 0.9 % 50 mL chemo infusion  200 mg Intravenous Once Virginia Slick, MD 116 mL/hr at 11/30/22 1437 200 mg at 11/30/22 1437   PEMEtrexed (ALIMTA) 900 mg in sodium chloride 0.9 % 100 mL chemo infusion  500 mg/m2 (Treatment Plan Recorded) Intravenous Once Virginia Slick, MD        REVIEW OF SYSTEMS:   Constitutional: ( - ) fevers, ( - )  chills , ( - ) night sweats Eyes: ( - ) blurriness of vision, ( - ) double vision, ( - ) watery eyes Ears, nose, mouth, throat, and face: ( - ) mucositis, ( - ) sore throat Respiratory: ( - ) cough, ( + ) dyspnea, ( - ) wheezes Cardiovascular: ( - ) palpitation, ( - ) chest discomfort, ( - ) lower extremity swelling Gastrointestinal:  ( + ) nausea, ( - ) heartburn, ( - ) change in bowel habits Skin: ( - ) abnormal skin rashes Lymphatics: ( - ) new lymphadenopathy, ( - ) easy bruising Neurological: ( - ) numbness, ( - ) tingling, ( - ) new weaknesses Behavioral/Psych: ( - ) mood change, ( - ) new changes  All other systems were reviewed with the patient and are negative.  PHYSICAL EXAMINATION: ECOG PERFORMANCE STATUS: 1 - Symptomatic but completely ambulatory  Vitals:   11/30/22 1254  BP: (!) 136/97  Pulse: 98  Resp: 16  Temp: 99 F (37.2 C)  SpO2: 99%   Filed Weights   11/30/22 1254  Weight: 144 lb 1.6 oz (65.4 kg)    GENERAL: well appearing middle aged  Caucasian female in NAD  SKIN: skin color, texture, turgor are normal, no rashes or significant lesions EYES: conjunctiva are pink and non-injected, sclera clear LUNGS: wheezing at lung bases. clear to auscultation and percussion with normal breathing effort HEART: regular rate & rhythm and no murmurs and bilateral trace pitting lower extremity edema Musculoskeletal: no cyanosis of digits and no clubbing PSYCH: alert & oriented x 3, fluent speech NEURO: no focal motor/sensory deficits  LABORATORY DATA:  I have reviewed the data as listed    Latest Ref Rng & Units 11/30/2022   12:34 PM 11/09/2022   10:42 AM 10/19/2022    9:00 AM  CBC  WBC 4.0 - 10.5 K/uL 6.7  5.6  6.3   Hemoglobin 12.0 - 15.0 g/dL 12.6  13.9  14.4   Hematocrit 36.0 - 46.0 % 36.5  40.3  40.9   Platelets 150 - 400 K/uL 383  399  402        Latest Ref Rng & Units 11/30/2022   12:34 PM 11/09/2022   10:42 AM 10/19/2022    9:00 AM  CMP  Glucose 70 - 99 mg/dL 106  97  104   BUN 8 - 23 mg/dL 12  14  8    Creatinine 0.44 - 1.00 mg/dL 0.73  0.80  0.72   Sodium 135 - 145 mmol/L 139  140  139   Potassium 3.5 - 5.1 mmol/L 3.4  3.4  3.3   Chloride 98 - 111 mmol/L 103  100  101   CO2 22 -  32 mmol/L 28  28  29    Calcium 8.9 - 10.3 mg/dL 8.8  9.1  9.4   Total Protein 6.5 - 8.1 g/dL 7.1  7.8  7.3   Total Bilirubin 0.3 - 1.2 mg/dL 0.5  0.6  0.5   Alkaline Phos 38 - 126 U/L 60  56  61   AST 15 - 41 U/L 22  26  21    ALT 0 - 44 U/L 16  17  12       RADIOGRAPHIC STUDIES: I personally have viewed the radiographic studies below: No results found.   ASSESSMENT & PLAN TAYDEN GUILER 67 y.o. female with medical history significant for metastatic adenocarcinoma of the lung presents for a follow up visit. 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   Previously we discussed Carboplatin/Pembrolizumab/Pemetrexed and the expected side effect from these medications.  We discussed the possible side effects  of immunotherapy including colitis, hepatitis, dermatitis, and pneumonitis.  Additionally we discussed the side effects of carboplatin which include suppression of blood counts, nephrotoxicity, and nausea.  Additionally we discussed pemetrexed which can also have effects on counts and would require supplementation with vitamin 123456 and folic acid.  She voiced her understanding of these side effects and treatment plans moving forward.  I also noted that we would be able to drop the chemotherapy agents that she had intolerance to continue with pembrolizumab alone given her high TPS score.  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 Due to worsening dizziness, she resumed this from Cycle 25-27.  PLAN: --Labs from today were reviewed and adequate for treatment.  Labs today show creatinine 0.80, white blood cell 6.7, hemoglobin 12.6, MCV 99.2, and platelets of 383 --repeat CT CAP on 10/11/2022 showed stable disease with no evidence of progression. Next due in May 2024.  --Continue with Cycle 45, Day 1 of maintenance Pem/Pem. --RTC q 3 weeks for labs, follow up visit prior to Cycle 46.    #Nausea/Vomiting, improving -- provided patient with Zofran 4-8mg  q8h PRN and compazine 10mg  q6H for breakthrough PRN.  She is currently using Phenergan for breakthrough. --continue olanzpaine 2.5 mg PO QHS to help with nausea. Marked improvement since the addition of this medication on 01/29/2020.  --continue to monitor   #Hypokalemia, stable --likely 2/2 to decreased PO intake, hypovolemia, and BP medications --Potassium level 3.4 today.   Recommend that she continue oral potassium chloride with treatment --Currently on potassium chloride tablets, 40 mEq PO potassium in AM and 16mEq in PM.   #Pain Control,  stable --continue oxycodone 5mg  q4H PRN. Can increase dose to 10mg  q6H if pain is severe. She is taking approximately 2-3 pills per day.  --continue Xtampza 13.5 mg ER BID for long acting support.  --continue to take with Senokot to prevent opioid induced constipation. 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 --nausea as above    #Brain Metastasis, stable --appreciate the assistance of Dr. 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 --radiation therapy to the brain completed on 11/16/2019.  --MRI brain on 04/29/2020 showed evidence of small brain bleed. Eliquis therapy was discontinued.  --MRI on 11/04/2020 showed concern for progression of intracranial disease.  --  On 12/02/2020, patient underwent craniotomy, resection of progressive cerebellar mass by Dr. Vertell Limber. 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  --MRI on 12/23/2020 showed evidence of radiation necrosis. Dr. Mickeal Skinner evaluated the patient on 01/02/2021 with recommendations to add IV Bevacizumab 10 mg/kg q 3 weeks ( started 01/26/2021). Completed a full course of this therapy. --Patient was evaluated by Dr. Mickeal Skinner on 08/29/2021 and recommended to resume Bevacizumab due to worsening dizziness.  Which was given from Cycle 25-27.  Plan: --continue to follow with Dr. Mickeal Skinner  --Avastin added back in the patient's chemotherapy regimen. Will restart in 4 weeks from today (out from dental procedure).  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    # Hypertension: --Continue Norvasc to 10 mg daily.  --continue to monitor at home and in clinic.    No orders of the defined types were placed in this encounter.   All questions  were answered. The patient knows to call the clinic with any problems, questions or concerns.  I have spent a total of 30 minutes minutes of face-to-face and non-face-to-face time, preparing to see the patient, performing a medically appropriate examination, counseling and educating the patient,  documenting clinical information in the electronic health record, and care coordination.   Ledell Peoples, MD Department of Hematology/Oncology [place] [facility] Phone: [phone] Pager: [phone] Email: [email or web address]   11/30/2022 2:57 PM   Literature Support:  Lorenza Chick, Rodrguez-Abreu D, Duffy Rhody, Felip E, De Angelis F, Domine M, [place], Hochmair MJ, [place], [place], Bischoff HG, [place], Grossi F, [place], Reck M, [place], [place], [place], Rubio-Viqueira B, Novello S, Kurata T, Gray JE, Vida J, Wei Z, Yang J, Raftopoulos H, [place], [place] [employer]; [id number] Investigators. Pembrolizumab plus Chemotherapy in Metastatic Non-Small-Cell Lung Cancer. [place] [employer] Med. 2018 May 31;378(22):2078-2092.  --Median progression-free survival was 8.8 months (95% CI, 7.6 to 9.2) in the pembrolizumab-combination group and 4.9 months (95% CI, 4.7 to 5.5) in the placebo-combination group (hazard ratio for disease progression or death, 0.52; 95% CI, 0.43 to 0.64; P<0.001). Adverse events of grade 3 or higher occurred in 67.2% of the patients in the pembrolizumab-combination group and in 65.8% of those in the placebo-combination group.  Jodene Nam L. Efficacy of pemetrexed-based regimens in advanced non-small cell lung cancer patients with activating epidermal growth factor receptor mutations after tyrosine kinase inhibitor failure: a systematic review. Onco Targets Ther. 2018;11:2121-2129.   --The weighted median PFS, median OS, and ORR for patients treated with pem regimens were 5.09 months, 15.91 months, and 30.19%, respectively.  Our systematic review results showed a favorable efficacy profile of pem regimens in NSCLC patients with EGFR mutation after EGFR-TKI failure.

## 2022-12-04 ENCOUNTER — Encounter: Payer: Medicare Other | Admitting: Physical Therapy

## 2022-12-05 ENCOUNTER — Other Ambulatory Visit: Payer: Self-pay | Admitting: Hematology and Oncology

## 2022-12-06 ENCOUNTER — Encounter: Payer: Self-pay | Admitting: Hematology and Oncology

## 2022-12-06 MED ORDER — OXYCODONE HCL 5 MG PO TABS: ORAL_TABLET | ORAL | 0 refills | Status: DC

## 2022-12-06 MED ORDER — XTAMPZA ER 13.5 MG PO C12A: 1.0000 | EXTENDED_RELEASE_CAPSULE | Freq: Two times a day (BID) | ORAL | 0 refills | Status: DC

## 2022-12-07 MED FILL — Pemetrexed Disodium IV Soln 100 MG/4ML (Base Equiv): INTRAVENOUS | Qty: 16 | Status: AC

## 2022-12-11 ENCOUNTER — Other Ambulatory Visit: Payer: Self-pay | Admitting: Hematology and Oncology

## 2022-12-11 ENCOUNTER — Ambulatory Visit: Payer: Medicare Other | Admitting: Physical Therapy

## 2022-12-11 ENCOUNTER — Telehealth: Payer: Self-pay | Admitting: Physical Therapy

## 2022-12-11 NOTE — Telephone Encounter (Signed)
Called patient and LVM about status regarding PT as has no more visits on schedule. Requested callback to confirm either self-discharge or return to PT.   Malachi Carl, PT, DPT

## 2022-12-17 ENCOUNTER — Encounter: Payer: Self-pay | Admitting: Physical Therapy

## 2022-12-17 NOTE — Therapy (Signed)
[facility] [address] [place], [place], [address] Phone: [phone]   Fax:  [phone]  Patient Details  Name: Virginia [name] MRN: [id number] Date of Birth: [birth year] Referring Provider:  Henreitta Leber, MD  Encounter Date: 12/17/2022  PHYSICAL THERAPY DISCHARGE SUMMARY  Visits from Start of Care: 3  Current functional level related to goals / functional outcomes: Unable to assess; patient has not returned since 3rd visit   Remaining deficits: Unable to assess; patient has not returned since 3rd visit     Education / Equipment: Unable to provide; patient has not returned since 3rd visit     Patient agrees to discharge. Patient goals were  left at unknown status . Patient is being discharged due to not returning since the last visit.   Called patient and LVM on 12/17/2022 informing that she was discharged from physical therapy due to not returning since last scheduled visit. Reported need for new referral if to return to PT.   Carmelia Bake, PT, DPT 12/17/2022, 12:31 PM  [facility] [address] [place], [place], [address] Phone: [phone]   Fax:  [phone]

## 2022-12-20 ENCOUNTER — Inpatient Hospital Stay (HOSPITAL_BASED_OUTPATIENT_CLINIC_OR_DEPARTMENT_OTHER): Payer: Medicare Other | Admitting: Hematology and Oncology

## 2022-12-20 ENCOUNTER — Inpatient Hospital Stay: Payer: Medicare Other | Attending: Hematology and Oncology

## 2022-12-20 ENCOUNTER — Inpatient Hospital Stay: Payer: Medicare Other

## 2022-12-20 VITALS — BP 108/74 | HR 94

## 2022-12-20 VITALS — BP 127/91 | HR 98 | Temp 98.0°F | Resp 15 | Wt 143.8 lb

## 2022-12-20 DIAGNOSIS — Z86718 Personal history of other venous thrombosis and embolism: Secondary | ICD-10-CM | POA: Insufficient documentation

## 2022-12-20 DIAGNOSIS — R0600 Dyspnea, unspecified: Secondary | ICD-10-CM | POA: Insufficient documentation

## 2022-12-20 DIAGNOSIS — Z79899 Other long term (current) drug therapy: Secondary | ICD-10-CM | POA: Diagnosis not present

## 2022-12-20 DIAGNOSIS — R918 Other nonspecific abnormal finding of lung field: Secondary | ICD-10-CM

## 2022-12-20 DIAGNOSIS — Z7901 Long term (current) use of anticoagulants: Secondary | ICD-10-CM | POA: Diagnosis not present

## 2022-12-20 DIAGNOSIS — C7931 Secondary malignant neoplasm of brain: Secondary | ICD-10-CM

## 2022-12-20 DIAGNOSIS — R42 Dizziness and giddiness: Secondary | ICD-10-CM | POA: Diagnosis not present

## 2022-12-20 DIAGNOSIS — Z5112 Encounter for antineoplastic immunotherapy: Secondary | ICD-10-CM | POA: Insufficient documentation

## 2022-12-20 DIAGNOSIS — C7951 Secondary malignant neoplasm of bone: Secondary | ICD-10-CM | POA: Diagnosis not present

## 2022-12-20 DIAGNOSIS — C3490 Malignant neoplasm of unspecified part of unspecified bronchus or lung: Secondary | ICD-10-CM | POA: Diagnosis not present

## 2022-12-20 DIAGNOSIS — E876 Hypokalemia: Secondary | ICD-10-CM | POA: Insufficient documentation

## 2022-12-20 DIAGNOSIS — Z90710 Acquired absence of both cervix and uterus: Secondary | ICD-10-CM | POA: Insufficient documentation

## 2022-12-20 DIAGNOSIS — Z5111 Encounter for antineoplastic chemotherapy: Secondary | ICD-10-CM | POA: Diagnosis present

## 2022-12-20 DIAGNOSIS — R110 Nausea: Secondary | ICD-10-CM | POA: Insufficient documentation

## 2022-12-20 DIAGNOSIS — R112 Nausea with vomiting, unspecified: Secondary | ICD-10-CM | POA: Insufficient documentation

## 2022-12-20 DIAGNOSIS — C3431 Malignant neoplasm of lower lobe, right bronchus or lung: Secondary | ICD-10-CM | POA: Diagnosis not present

## 2022-12-20 DIAGNOSIS — Z95828 Presence of other vascular implants and grafts: Secondary | ICD-10-CM

## 2022-12-20 DIAGNOSIS — Z9049 Acquired absence of other specified parts of digestive tract: Secondary | ICD-10-CM | POA: Insufficient documentation

## 2022-12-20 LAB — CBC WITH DIFFERENTIAL (CANCER CENTER ONLY)
Abs Immature Granulocytes: 0.02 10*3/uL (ref 0.00–0.07)
Basophils Absolute: 0 10*3/uL (ref 0.0–0.1)
Basophils Relative: 1 %
Eosinophils Absolute: 0.2 10*3/uL (ref 0.0–0.5)
Eosinophils Relative: 3 %
HCT: 39.3 % (ref 36.0–46.0)
Hemoglobin: 13.4 g/dL (ref 12.0–15.0)
Immature Granulocytes: 0 %
Lymphocytes Relative: 41 %
Lymphs Abs: 2.3 10*3/uL (ref 0.7–4.0)
MCH: 33.9 pg (ref 26.0–34.0)
MCHC: 34.1 g/dL (ref 30.0–36.0)
MCV: 99.5 fL (ref 80.0–100.0)
Monocytes Absolute: 0.8 10*3/uL (ref 0.1–1.0)
Monocytes Relative: 14 %
Neutro Abs: 2.3 10*3/uL (ref 1.7–7.7)
Neutrophils Relative %: 41 %
Platelet Count: 348 10*3/uL (ref 150–400)
RBC: 3.95 MIL/uL (ref 3.87–5.11)
RDW: 14.7 % (ref 11.5–15.5)
WBC Count: 5.6 10*3/uL (ref 4.0–10.5)
nRBC: 0 % (ref 0.0–0.2)

## 2022-12-20 LAB — CMP (CANCER CENTER ONLY)
ALT: 12 U/L (ref 0–44)
AST: 20 U/L (ref 15–41)
Albumin: 3.5 g/dL (ref 3.5–5.0)
Alkaline Phosphatase: 60 U/L (ref 38–126)
Anion gap: 8 (ref 5–15)
BUN: 11 mg/dL (ref 8–23)
CO2: 27 mmol/L (ref 22–32)
Calcium: 9.4 mg/dL (ref 8.9–10.3)
Chloride: 104 mmol/L (ref 98–111)
Creatinine: 0.67 mg/dL (ref 0.44–1.00)
GFR, Estimated: >60 mL/min
Glucose, Bld: 104 mg/dL — ABNORMAL HIGH (ref 70–99)
Potassium: 3.8 mmol/L (ref 3.5–5.1)
Sodium: 139 mmol/L (ref 135–145)
Total Bilirubin: 0.4 mg/dL (ref 0.3–1.2)
Total Protein: 7.2 g/dL (ref 6.5–8.1)

## 2022-12-20 LAB — TSH: TSH: 2.471 u[IU]/mL (ref 0.350–4.500)

## 2022-12-20 MED ORDER — SODIUM CHLORIDE 0.9 % IV SOLN
200.0000 mg | Freq: Once | INTRAVENOUS | Status: AC
  Administered 2022-12-20: 200 mg via INTRAVENOUS
  Filled 2022-12-20: qty 200

## 2022-12-20 MED ORDER — OXYCODONE HCL 5 MG PO TABS: ORAL_TABLET | ORAL | 0 refills | Status: DC

## 2022-12-20 MED ORDER — PROCHLORPERAZINE MALEATE 10 MG PO TABS
10.0000 mg | ORAL_TABLET | Freq: Once | ORAL | Status: AC
  Administered 2022-12-20: 10 mg via ORAL
  Filled 2022-12-20: qty 1

## 2022-12-20 MED ORDER — SODIUM CHLORIDE 0.9 % IV SOLN
500.0000 mg/m2 | Freq: Once | INTRAVENOUS | Status: AC
  Administered 2022-12-20: 900 mg via INTRAVENOUS
  Filled 2022-12-20: qty 20

## 2022-12-20 MED ORDER — SODIUM CHLORIDE 0.9 % IV SOLN: Freq: Once | INTRAVENOUS | Status: AC

## 2022-12-20 NOTE — Progress Notes (Signed)
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 CT of the head showed showed concerning for metastatic disease involving the left cerebellum and right occipital lobe.  2) 09/30/2019: MRI brain confirms mass within the left cerebellum measures 1.6 x 1.3 x 1.5 cm as well as at least 8 metastatic lesions within the supratentorial and infratentorial brain as outlined 3) 10/01/2019: PET CT scan revealed hypermetabolic infrahilar right lower lobe mass with hypermetabolic nodal metastases in the right hilum, mediastinum, right supraclavicular region, left axilla and lower left neck. 4) 10/08/2019: US guided biopsy of the lymph nodes confirms poorly differentiated non-small cell carcinoma. Immunohistochemistry confirms adenocarcinoma of lung primary. PD-L1 and NGS later revealed a MET Exon 14 mutation and TPS of 90%.  5) 10/12/2019: Establish care with Dr. Lorenso Courier  6) 11/04/2019: Day 1 of capmatinib 427m BID 7) 11/09/2019: presented to Rad/Onc with a DVT in LLE. Seen in symptom management clinic and started on apixaban.  8) 12/29/2019: CT C/A/P showed interval decrease in size of mass involving the superior segment of right lower lobe and reduction in mediastinal and left supraclavicular adenopathy though some small spinal lesions were noted in the interim between the two sets of scans 9) 01/25/2020: temporarily stopped capmatinib due to symptoms of nausea/fatigue. Restarted therapy on 01/30/2020 after brief chemo holiday.  10)  03/22/2020: CT C/A/P reveals a mixed picture, with new lung nodule and increased lymphadenopathy, however response in bone lesions. D/c capmatinib therapy 11) 04/08/2020: start of Carbo/Pem/Pem for 2nd line treatment. Cycle 1 Day 1   12) 04/29/2020: Cycle 2 Day 1 Carbo/Pem/Pem  13) 05/20/2020: Cycle 3 Day 1 Carbo/Pem/Pem  14) 06/09/2020: Cycle 4 Day 1 Carbo/Pem/Pem  15) 06/29/2020: Cycle 5 Day 1 maintenance Pem/Pem  16) 07/20/2020: Cycle 6 Day 1 maintenance Pem/Pem  17) 08/12/2020: Cycle 7 Day 1 maintenance Pem/Pem  18) 09/01/2020: Cycle 8 Day 1 maintenance Pem/Pem  19) 09/21/2020: Cycle 9 Day 1 maintenance Pem/Pem  20) 10/13/2020: Cycle 10 Day 1 maintenance Pem/Pem  21) 11/03/2020: Cycle 11 Day 1 maintenance Pem/Pem  22) 11/25/2020:  Cycle 12 Day 1 maintenance Pem/Pem 23) 12/15/2020:  Cycle 13 Day 1 maintenance Pem/Pem 24) 01/05/2021:  Cycle 14 Day 1 maintenance Pem/Pem 25) 01/26/2021:  Cycle 15 Day 1 maintenance Pem/Pem plus Bevacizumab.  02/17/2021: Cycle 16 Day 1 maintenance Pem/Pem plus Bevacizumab.  03/10/2021: Cycle 17 Day 1 maintenance Pem/Pem plus Bevacizumab.  03/31/2021: Cycle 18 Day 1 maintenance Pem/Pem  04/20/2021:  Cycle 19 Day 1 maintenance Pem/Pem  05/12/2021: Cycle 20 Day 1 maintenance Pem/Pem 06/01/2021: Cycle 21 Day 1 maintenance Pem/Pem 06/22/2021: Cycle 22 Day 1 maintenance Pem/Pem 07/13/2021: Cycle 23 Day 1 maintenance Pem/Pem 08/16/2021: Cycle 24 Day 1 maintenance Pem/Pem 09/08/2021: Cycle 25 Day 1 maintenance Pem/Pem plus Bevacizumab 09/28/2021: Cycle 26 Day 1 maintenance Pem/Pem plus Bevacizumab 10/20/2021: Cycle 27 Day 1 maintenance Pem/Pem plus Bevacizumab 11/09/2021: Cycle 28 Day 1 maintenance Pem/Pem  12/06/2021: Cycle 29 Day 1 maintenance Pem/Pem  12/27/2021: Cycle 30 Day 1 maintenance Pem/Pem  01/18/2022: Cycle 31 Day 1 maintenance Pem/Pem  02/07/2022: Cycle 32 Day  1 maintenance Pem/Pem  02/28/2022: Cycle 33 Day 1 maintenance Pem/Pem  03/23/2022: Cycle 34 Day 1 maintenance Pem/Pem   04/13/2022: Cycle 35 Day 1 maintenance Pem/Pem  05/04/2022: Cycle 36 Day 1 maintenance Pem/Pem  05/25/2022: Cycle 37 Day 1 maintenance Pem/Pem  06/14/2022: Cycle 38 Day 1 maintenance Pem/Pem 07/05/2022: Cycle 39 Day 1 maintenance Pem/Pem 08/13/2022: Cycle 40 Day 1 maintenance Pem/Pem 09/06/2022:Cycle 41 Day 1 maintenance Pem/Pem 09/27/2022: Cycle 42 Day 1 maintenance Pem/Pem 10/19/2022: Cycle 43 Day 1 maintenance Pem/Pem 11/09/2022: Cycle 44 Day 1 maintenance Pem/Pem 11/30/2022: Cycle 45 Day 1 maintenance Pem/Pem 12/20/2022: Cycle 46 Day 1 maintenance Pem/Pem  Interval History:  Virginia [name] 67 y.o. female with medical history significant for metastatic adenocarcinoma of the lung presents for a follow up visit. The patient's last visit was on 11/30/2022.  In the interim since the last visit she has continued on maintenance Pem/Pem.   On exam today Virginia [name] reports she has been well overall interim since her last visit.  She is having some swelling and discomfort in her right lower extremity, mostly in the upper thigh area.  She notes that she does continue to have her dizziness and sleepiness which is stable.  She has been eating well and does enjoy eating cookies.  She reports that she has been walking laps but "never too far".  Her pain levels have been under good control with her current pain regimen.  She also notes that she is been trying to eat more kiwi in order to boost her potassium levels.  She notes that overall she is at her baseline level of health and willing and able to proceed with continued treatment today.  She otherwise denies any changes in her health.  She denies any fevers, chills, sweats, vomiting, diarrhea.  Full 10 point ROS was otherwise negative  MEDICAL HISTORY:  Past Medical History:  Diagnosis Date   Anemia    Anxiety    Concussion 09/28/2019   DVT (deep venous thrombosis) (HCC) 2021   L leg   Dyspnea    GERD (gastroesophageal reflux disease)     Hypercholesterolemia    per pt, she does not have elevated lipids   Hypertension    met lung ca dx'd 09/2019   mets to spine, hip and brain   PONV (postoperative nausea and vomiting)    Tobacco abuse     SURGICAL HISTORY: Past Surgical History:  Procedure Laterality Date   ABDOMINAL HYSTERECTOMY     partial/ left ovaries   APPLICATION OF CRANIAL NAVIGATION N/A 12/02/2020   Procedure: APPLICATION OF CRANIAL NAVIGATION;  Surgeon: Maeola Harman, MD;  Location: [facility] OR;  Service: Neurosurgery;  Laterality: N/A;   CHOLECYSTECTOMY     CRANIOTOMY N/A 12/02/2020   Procedure: Posterior fossa craniotomy for tumor resection with brainlab;  Surgeon: Maeola Harman, MD;  Location: [facility] OR;  Service: Neurosurgery;  Laterality: N/A;   DILATION AND CURETTAGE OF UTERUS     IR IMAGING GUIDED PORT INSERTION  10/23/2019   IR PATIENT EVAL TECH 0-60 MINS  11/21/2021   IR PATIENT EVAL TECH 0-60 MINS  11/24/2021   IR PATIENT EVAL TECH 0-60 MINS  11/29/2021   IR PATIENT EVAL TECH 0-60 MINS  12/04/2021   IR PATIENT EVAL TECH 0-60 MINS  12/12/2021   IR PATIENT EVAL TECH 0-60 MINS  12/19/2021   IR PATIENT EVAL TECH 0-60 MINS  12/27/2021   IR PATIENT EVAL TECH 0-60 MINS  01/03/2022   IR PATIENT EVAL TECH 0-60  MINS  01/10/2022   IR PATIENT EVAL TECH 0-60 MINS  01/18/2022   IR PATIENT EVAL TECH 0-60 MINS  02/13/2022   IR PATIENT EVAL TECH 0-60 MINS  02/20/2022   IR RADIOLOGIST EVAL & MGMT  01/23/2022   IR REMOVAL TUN ACCESS W/ PORT W/O FL MOD SED  11/17/2021   KYPHOPLASTY N/A 03/15/2020   Procedure: Thoracic Eight KYPHOPLASTY;  Surgeon: Maeola Harman, MD;  Location: [facility] OR;  Service: Neurosurgery;  Laterality: N/A;  prone    LIPOMA EXCISION  2018   removed under left breast and right thigh.   TUBAL LIGATION      ALLERGIES:  has No Known Allergies.  MEDICATIONS:  Current Outpatient Medications  Medication Sig Dispense Refill   acetaminophen (TYLENOL) 500 MG tablet Take 500 mg by mouth 2 (two) times daily as needed for moderate  pain.     albuterol (VENTOLIN HFA) 108 (90 Base) MCG/ACT inhaler INHALE 2 PUFFS INTO THE LUNGS EVERY 6 HOURS AS NEEDED FOR WHEEZING OR SHORTNESS OF BREATH 6.7 g 11   ALPRAZolam (XANAX) 0.25 MG tablet Take 1 tablet (0.25 mg total) by mouth 2 (two) times daily as needed for anxiety 60 tablet 0   amLODipine (NORVASC) 10 MG tablet Take 1 tablet (10 mg total) by mouth daily. 90 tablet 3   Cyanocobalamin (VITAMIN B12 PO) Take 1 tablet by mouth daily. Gummies     EPINEPHrine 0.3 mg/0.3 mL IJ SOAJ injection Inject 0.3 mg into the muscle as needed for anaphylaxis. 2 each 0   folic acid (FOLVITE) 1 MG tablet TAKE 1 TABLET BY MOUTH DAILY 90 tablet 1   furosemide (LASIX) 20 MG tablet Take 2 tablets (40 mg total) by mouth 2 times daily. 360 tablet 0   Glucosamine HCl (GLUCOSAMINE PO) Take 2 tablets by mouth daily.     lidocaine-prilocaine (EMLA) cream Apply to port as needed. 30 g 0   LYSINE PO Take 1 tablet by mouth daily.     meclizine (ANTIVERT) 25 MG tablet Take 1 tablet (25 mg total) by mouth 3 (three) times daily as needed for dizziness. 30 tablet 0   Multiple Vitamin (MULTIVITAMIN WITH MINERALS) TABS tablet Take 1 tablet by mouth daily.     Multiple Vitamins-Minerals (AIRBORNE PO) Take 1 tablet by mouth daily.      OLANZapine (ZYPREXA) 2.5 MG tablet TAKE 1 TABLET BY MOUTH AT BEDTIME 90 tablet 1   ondansetron (ZOFRAN) 4 MG tablet TAKE 1 TABLET(4 MG) BY MOUTH EVERY 8 HOURS AS NEEDED FOR NAUSEA OR VOMITING 40 tablet 2   OVER THE COUNTER MEDICATION Take 2 tablets by mouth daily. Beet root     oxyCODONE (OXY IR/ROXICODONE) 5 MG immediate release tablet TAKE 1 TO 2 TABLETS BY MOUTH EVERY 4 HOURS AS NEEDED FOR SEVERE PAIN 90 tablet 0   pantoprazole (PROTONIX) 40 MG tablet Take 1 tablet (40 mg total) by mouth daily. 90 tablet 3   polyethylene glycol (MIRALAX / GLYCOLAX) 17 g packet Take 17 g by mouth every evening.     potassium chloride SA (KLOR-CON M) 20 MEQ tablet TAKE 2 TABLETS(40 MEQ) BY MOUTH TWICE DAILY  360 tablet 0   Tetrahydrozoline HCl (VISINE OP) Place 1 drop into both eyes daily as needed (irritation).     VITAMIN D PO Take 1 capsule by mouth daily.     vitamin E 180 MG (400 UNITS) capsule Take 400 IU daily x 1 week, then 400 IU BID (Patient taking differently: Take 400 Units  by mouth daily.) 60 capsule 4   XTAMPZA ER 13.5 MG C12A Take 1 capsule by mouth in the morning and at bedtime. (every 12 hours) 60 capsule 0   No current facility-administered medications for this visit.    REVIEW OF SYSTEMS:   Constitutional: ( - ) fevers, ( - )  chills , ( - ) night sweats Eyes: ( - ) blurriness of vision, ( - ) double vision, ( - ) watery eyes Ears, nose, mouth, throat, and face: ( - ) mucositis, ( - ) sore throat Respiratory: ( - ) cough, ( + ) dyspnea, ( - ) wheezes Cardiovascular: ( - ) palpitation, ( - ) chest discomfort, ( - ) lower extremity swelling Gastrointestinal:  ( + ) nausea, ( - ) heartburn, ( - ) change in bowel habits Skin: ( - ) abnormal skin rashes Lymphatics: ( - ) new lymphadenopathy, ( - ) easy bruising Neurological: ( - ) numbness, ( - ) tingling, ( - ) new weaknesses Behavioral/Psych: ( - ) mood change, ( - ) new changes  All other systems were reviewed with the patient and are negative.  PHYSICAL EXAMINATION: ECOG PERFORMANCE STATUS: 1 - Symptomatic but completely ambulatory  Vitals:   12/20/22 1054  BP: (!) 127/91  Pulse: 98  Resp: 15  Temp: 98 F (36.7 C)  SpO2: 97%    Filed Weights   12/20/22 1054  Weight: 143 lb 12.8 oz (65.2 kg)     GENERAL: well appearing middle aged Caucasian female in NAD  SKIN: skin color, texture, turgor are normal, no rashes or significant lesions EYES: conjunctiva are pink and non-injected, sclera clear LUNGS: wheezing at lung bases. clear to auscultation and percussion with normal breathing effort HEART: regular rate & rhythm and no murmurs and bilateral trace pitting lower extremity edema Musculoskeletal: no cyanosis of  digits and no clubbing PSYCH: alert & oriented x 3, fluent speech NEURO: no focal motor/sensory deficits  LABORATORY DATA:  I have reviewed the data as listed    Latest Ref Rng & Units 12/20/2022   10:24 AM 11/30/2022   12:34 PM 11/09/2022   10:42 AM  CBC  WBC 4.0 - 10.5 K/uL 5.6  6.7  5.6   Hemoglobin 12.0 - 15.0 g/dL 25.313.4  66.412.6  40.313.9   Hematocrit 36.0 - 46.0 % 39.3  36.5  40.3   Platelets 150 - 400 K/uL 348  383  399        Latest Ref Rng & Units 12/20/2022   10:24 AM 11/30/2022   12:34 PM 11/09/2022   10:42 AM  CMP  Glucose 70 - 99 mg/dL 474104  259106  97   BUN 8 - 23 mg/dL 11  12  14    Creatinine 0.44 - 1.00 mg/dL 5.630.67  8.750.73  6.430.80   Sodium 135 - 145 mmol/L 139  139  140   Potassium 3.5 - 5.1 mmol/L 3.8  3.4  3.4   Chloride 98 - 111 mmol/L 104  103  100   CO2 22 - 32 mmol/L 27  28  28    Calcium 8.9 - 10.3 mg/dL 9.4  8.8  9.1   Total Protein 6.5 - 8.1 g/dL 7.2  7.1  7.8   Total Bilirubin 0.3 - 1.2 mg/dL 0.4  0.5  0.6   Alkaline Phos 38 - 126 U/L 60  60  56   AST 15 - 41 U/L 20  22  26    ALT 0 - 44 U/L  12  16  17       RADIOGRAPHIC STUDIES: I personally have viewed the radiographic studies below: No results found.   ASSESSMENT & PLAN AMOYA FAZZONE 67 y.o. female with medical history significant for metastatic adenocarcinoma of the lung presents for a follow up visit. 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   Previously we discussed Carboplatin/Pembrolizumab/Pemetrexed and the expected side effect from these medications.  We discussed the possible side effects of immunotherapy including colitis, hepatitis, dermatitis, and pneumonitis.  Additionally we discussed the side effects of carboplatin which include suppression of blood counts, nephrotoxicity, and nausea.  Additionally we discussed pemetrexed which can also have effects on counts and would require supplementation with vitamin B12 and folic acid.  She voiced her understanding of  these side effects and treatment plans moving forward.  I also noted that we would be able to drop the chemotherapy agents that she had intolerance to continue with pembrolizumab alone given her high TPS score.  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 Due to worsening dizziness, she resumed this from Cycle 25-27.  PLAN: --Labs from today were reviewed and adequate for treatment.  Labs today show creatinine 0.67, white blood cell count 5.6, hemoglobin 13.4, MCV 99.5, and platelets of 348 --repeat CT CAP on 10/11/2022 showed stable disease with no evidence of progression. Next due in May 2024.  --Continue with Cycle 46, Day 1 of maintenance Pem/Pem. --RTC q 3 weeks for labs, follow up visit prior to Cycle 47.    #Nausea/Vomiting, improving -- provided patient with Zofran 4-8mg  q8h PRN and compazine 10mg  q6H for breakthrough PRN.  She is currently using Phenergan for breakthrough. --continue olanzpaine 2.5 mg PO QHS to help with nausea. Marked improvement since the addition of this medication on 01/29/2020.  --continue to monitor   #Hypokalemia, stable --likely 2/2 to decreased PO intake, hypovolemia, and BP medications --Potassium level 3.8 today.   --Currently on potassium chloride tablets, 40 mEq PO potassium in AM and in PM.   #Pain Control, stable --continue oxycodone 5mg  q4H PRN. Can increase dose to 10mg  q6H if pain is severe. She is taking approximately 2-3 pills per day.  --continue Xtampza 13.5 mg ER BID for long acting support.  --continue to take with Senokot to prevent opioid induced constipation. 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 --nausea as above    #Brain  Metastasis, stable --appreciate the assistance of Dr. Barbaraann Cao in treating her brain metastasis and symptoms. --radiation therapy to the brain completed on 11/16/2019.  --MRI brain on 04/29/2020 showed evidence of small brain bleed. Eliquis therapy was discontinued.  --MRI on 11/04/2020 showed concern for progression of intracranial disease.  --On 12/02/2020, patient underwent craniotomy, resection of progressive cerebellar mass by Dr. Venetia Maxon. 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  --MRI on 12/23/2020 showed evidence of radiation necrosis. Dr. Barbaraann Cao evaluated the patient on 01/02/2021 with recommendations to add IV Bevacizumab 10 mg/kg q 3 weeks ( started 01/26/2021). Completed a full course of this therapy. --Patient was evaluated by Dr. Barbaraann Cao on 08/29/2021 and recommended to resume Bevacizumab due to worsening dizziness.  Which was given from Cycle 25-27.  Plan: --continue to  follow with Dr. Barbaraann Cao  --Avastin added back in the patient's chemotherapy regimen. Will restart in 4 weeks from today (out from dental procedure).  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    # Hypertension: --Continue Norvasc to 10 mg daily.  --continue to monitor at home and in clinic.    No orders of the defined types were placed in this encounter.   All questions were answered. The patient knows to call the clinic with any problems, questions or concerns.  I have spent a total of 30 minutes minutes of face-to-face and non-face-to-face time, preparing to see the patient, performing a medically appropriate examination, counseling and educating the patient,  documenting clinical information in the electronic health record, and care coordination.   Ulysees Barns, MD Department of  Hematology/Oncology [facility] Phone: [phone] Pager: [phone] Email: [email or web address]   12/20/2022 4:00 PM   Literature Support:  Lynn Ito, Rodrguez-Abreu D, Naaman Plummer, Felip E, [place] F, Domine M, Maria Antonia, Hochmair MJ, [place], [place], [address], [place], Grossi F, [place], Reck M, [place], [place], [place], Rubio-Viqueira B, Novello S, Kurata T, Gray JE, Vida J, Wei Z, Yang J, Raftopoulos H, [place], [employer]; [id number] Investigators. Pembrolizumab plus Chemotherapy in Metastatic Non-Small-Cell Lung Cancer. Malva Limes Med. 2018 May 31;378(22):2078-2092.  --Median progression-free survival was 8.8 months (95% CI, 7.6 to 9.2) in the pembrolizumab-combination group and 4.9 months (95% CI, 4.7 to 5.5) in the placebo-combination group (hazard ratio for disease progression or death, 0.52; 95% CI, 0.43 to 0.64; P<0.001). Adverse events of grade 3 or higher occurred in 67.2% of the patients in the pembrolizumab-combination group and in 65.8% of those in the placebo-combination group.  Cristal Generous L. Efficacy of pemetrexed-based regimens in advanced non-small cell lung cancer patients with activating epidermal growth factor receptor mutations after tyrosine kinase inhibitor failure: a systematic review. Onco Targets Ther. 2018;11:2121-2129.   --The weighted median PFS, median OS, and ORR for patients treated with pem regimens were 5.09 months, 15.91 months, and 30.19%, respectively. Our systematic review results showed a favorable efficacy profile of pem regimens in NSCLC patients with EGFR mutation after EGFR-TKI failure.

## 2022-12-20 NOTE — Patient Instructions (Signed)
[facility]  Discharge Instructions: Thank you for choosing [facility] to provide your oncology and hematology care.   If you have a lab appointment with the Cancer Center, please go directly to the Cancer Center and check in at the registration area.   Wear comfortable clothing and clothing appropriate for easy access to any Portacath or PICC line.   We strive to give you quality time with your provider. You may need to reschedule your appointment if you arrive late (15 or more minutes).  Arriving late affects you and other patients whose appointments are after yours.  Also, if you miss three or more appointments without notifying the office, you may be dismissed from the clinic at the provider's discretion.      For prescription refill requests, have your pharmacy contact our office and allow 72 hours for refills to be completed.    Today you received the following chemotherapy and/or immunotherapy agents: Keytruda, Alimta.       To help prevent nausea and vomiting after your treatment, we encourage you to take your nausea medication as directed.  BELOW ARE SYMPTOMS THAT SHOULD BE REPORTED IMMEDIATELY: *FEVER GREATER THAN 100.4 F (38 C) OR HIGHER *CHILLS OR SWEATING *NAUSEA AND VOMITING THAT IS NOT CONTROLLED WITH YOUR NAUSEA MEDICATION *UNUSUAL SHORTNESS OF BREATH *UNUSUAL BRUISING OR BLEEDING *URINARY PROBLEMS (pain or burning when urinating, or frequent urination) *BOWEL PROBLEMS (unusual diarrhea, constipation, pain near the anus) TENDERNESS IN MOUTH AND THROAT WITH OR WITHOUT PRESENCE OF ULCERS (sore throat, sores in mouth, or a toothache) UNUSUAL RASH, SWELLING OR PAIN  UNUSUAL VAGINAL DISCHARGE OR ITCHING   Items with * indicate a potential emergency and should be followed up as soon as possible or go to the Emergency Department if any problems should occur.  Please show the CHEMOTHERAPY ALERT CARD or IMMUNOTHERAPY ALERT CARD  at check-in to the Emergency Department and triage nurse.  Should you have questions after your visit or need to cancel or reschedule your appointment, please contact [facility]  Dept: [phone]  and follow the prompts.  Office hours are 8:00 a.m. to 4:30 p.m. Monday - Friday. Please note that voicemails left after 4:00 p.m. may not be returned until the following business day.  We are closed weekends and major holidays. You have access to a nurse at all times for urgent questions. Please call the main number to the clinic Dept: [phone] and follow the prompts.   For any non-urgent questions, you may also contact your provider using MyChart. We now offer e-Visits for anyone 18 and older to request care online for non-urgent symptoms. For details visit [email or web address].   Also download the MyChart app! Go to the app store, search "MyChart", open the app, select [place], and log in with your MyChart username and password.   

## 2022-12-21 ENCOUNTER — Ambulatory Visit (HOSPITAL_COMMUNITY): Admission: RE | Admit: 2022-12-21 | Payer: Medicare Other | Source: Ambulatory Visit

## 2022-12-21 ENCOUNTER — Other Ambulatory Visit: Payer: Self-pay | Admitting: *Deleted

## 2022-12-25 ENCOUNTER — Ambulatory Visit (HOSPITAL_COMMUNITY)
Admission: RE | Admit: 2022-12-25 | Discharge: 2022-12-25 | Disposition: A | Discharge status: Home / Self Care | Payer: Medicare Other | Source: Ambulatory Visit | Attending: Hematology and Oncology | Admitting: Hematology and Oncology

## 2022-12-25 DIAGNOSIS — C7951 Secondary malignant neoplasm of bone: Secondary | ICD-10-CM

## 2022-12-25 DIAGNOSIS — M7989 Other specified soft tissue disorders: Secondary | ICD-10-CM | POA: Diagnosis not present

## 2022-12-26 ENCOUNTER — Telehealth: Payer: Self-pay | Admitting: *Deleted

## 2022-12-26 NOTE — Telephone Encounter (Signed)
TCT patient's husband, Harvie Heck. Spoke with him and advised that the [name] sound of [name]'s right upper leg did not show any blood clots. Advised to just keep an eye on that area, apply warm compresses as needed.  Harvie Heck voiced understanding.

## 2022-12-26 NOTE — Telephone Encounter (Signed)
-----   Message from Jaci Standard, MD sent at 12/26/2022  7:36 AM EDT ----- Please let Virginia [name] know that her [name] did not show any evidence of a blood clot.  ----- Message ----- From: Interface, [employer] Sent: 12/25/2022   3:39 PM EDT To: Jaci Standard, MD

## 2023-01-02 ENCOUNTER — Other Ambulatory Visit: Payer: Self-pay | Admitting: Hematology and Oncology

## 2023-01-03 ENCOUNTER — Ambulatory Visit: Payer: Medicare Other | Admitting: Hematology and Oncology

## 2023-01-03 ENCOUNTER — Ambulatory Visit: Payer: Medicare Other

## 2023-01-03 ENCOUNTER — Other Ambulatory Visit: Payer: Medicare Other

## 2023-01-04 ENCOUNTER — Other Ambulatory Visit: Payer: Self-pay | Admitting: Hematology and Oncology

## 2023-01-04 MED ORDER — XTAMPZA ER 13.5 MG PO C12A: 1.0000 | EXTENDED_RELEASE_CAPSULE | Freq: Two times a day (BID) | ORAL | 0 refills | Status: DC

## 2023-01-08 ENCOUNTER — Telehealth: Payer: Self-pay

## 2023-01-08 NOTE — Telephone Encounter (Signed)
Returned call to pt's husband regarding not having a provider visit with pt's next appt. Assured pt's husband that if pt was having any issues we would have a Provider see the pt. Pt's husband stated pt was doing well with no issues to report. Pt will see provider on 01/31/23. Husband verbalized understanding and acknowledged information.

## 2023-01-10 ENCOUNTER — Inpatient Hospital Stay: Payer: Medicare Other | Attending: Hematology and Oncology

## 2023-01-10 ENCOUNTER — Inpatient Hospital Stay: Payer: Medicare Other

## 2023-01-10 ENCOUNTER — Ambulatory Visit: Payer: Medicare Other | Admitting: Physician Assistant

## 2023-01-10 VITALS — BP 110/80 | HR 90 | Temp 98.0°F | Resp 18

## 2023-01-10 DIAGNOSIS — Z9049 Acquired absence of other specified parts of digestive tract: Secondary | ICD-10-CM | POA: Diagnosis not present

## 2023-01-10 DIAGNOSIS — R062 Wheezing: Secondary | ICD-10-CM | POA: Diagnosis not present

## 2023-01-10 DIAGNOSIS — R918 Other nonspecific abnormal finding of lung field: Secondary | ICD-10-CM

## 2023-01-10 DIAGNOSIS — R0600 Dyspnea, unspecified: Secondary | ICD-10-CM | POA: Diagnosis not present

## 2023-01-10 DIAGNOSIS — Z5111 Encounter for antineoplastic chemotherapy: Secondary | ICD-10-CM | POA: Insufficient documentation

## 2023-01-10 DIAGNOSIS — Z86718 Personal history of other venous thrombosis and embolism: Secondary | ICD-10-CM | POA: Diagnosis not present

## 2023-01-10 DIAGNOSIS — Z7901 Long term (current) use of anticoagulants: Secondary | ICD-10-CM | POA: Insufficient documentation

## 2023-01-10 DIAGNOSIS — R42 Dizziness and giddiness: Secondary | ICD-10-CM | POA: Diagnosis not present

## 2023-01-10 DIAGNOSIS — R112 Nausea with vomiting, unspecified: Secondary | ICD-10-CM | POA: Insufficient documentation

## 2023-01-10 DIAGNOSIS — Z79620 Long term (current) use of immunosuppressive biologic: Secondary | ICD-10-CM | POA: Insufficient documentation

## 2023-01-10 DIAGNOSIS — I10 Essential (primary) hypertension: Secondary | ICD-10-CM | POA: Insufficient documentation

## 2023-01-10 DIAGNOSIS — E876 Hypokalemia: Secondary | ICD-10-CM | POA: Diagnosis not present

## 2023-01-10 DIAGNOSIS — M549 Dorsalgia, unspecified: Secondary | ICD-10-CM | POA: Diagnosis not present

## 2023-01-10 DIAGNOSIS — Z90710 Acquired absence of both cervix and uterus: Secondary | ICD-10-CM | POA: Diagnosis not present

## 2023-01-10 DIAGNOSIS — Z79899 Other long term (current) drug therapy: Secondary | ICD-10-CM | POA: Diagnosis not present

## 2023-01-10 DIAGNOSIS — Z5112 Encounter for antineoplastic immunotherapy: Secondary | ICD-10-CM | POA: Insufficient documentation

## 2023-01-10 DIAGNOSIS — R110 Nausea: Secondary | ICD-10-CM | POA: Diagnosis not present

## 2023-01-10 DIAGNOSIS — C3431 Malignant neoplasm of lower lobe, right bronchus or lung: Secondary | ICD-10-CM | POA: Diagnosis present

## 2023-01-10 DIAGNOSIS — Z79631 Long term (current) use of antimetabolite agent: Secondary | ICD-10-CM | POA: Diagnosis not present

## 2023-01-10 DIAGNOSIS — C7931 Secondary malignant neoplasm of brain: Secondary | ICD-10-CM | POA: Diagnosis not present

## 2023-01-10 DIAGNOSIS — R600 Localized edema: Secondary | ICD-10-CM | POA: Insufficient documentation

## 2023-01-10 LAB — CMP (CANCER CENTER ONLY)
ALT: 10 U/L (ref 0–44)
AST: 18 U/L (ref 15–41)
Albumin: 3.6 g/dL (ref 3.5–5.0)
Alkaline Phosphatase: 58 U/L (ref 38–126)
Anion gap: 8 (ref 5–15)
BUN: 11 mg/dL (ref 8–23)
CO2: 31 mmol/L (ref 22–32)
Calcium: 9 mg/dL (ref 8.9–10.3)
Chloride: 102 mmol/L (ref 98–111)
Creatinine: 0.7 mg/dL (ref 0.44–1.00)
GFR, Estimated: >60 mL/min (ref >60)
Glucose, Bld: 98 mg/dL (ref 70–99)
Potassium: 3.5 mmol/L (ref 3.5–5.1)
Sodium: 141 mmol/L (ref 135–145)
Total Bilirubin: 0.3 mg/dL (ref 0.3–1.2)
Total Protein: 7.4 g/dL (ref 6.5–8.1)

## 2023-01-10 LAB — CBC WITH DIFFERENTIAL (CANCER CENTER ONLY)
Abs Immature Granulocytes: 0.01 10*3/uL (ref 0.00–0.07)
Basophils Absolute: 0 10*3/uL (ref 0.0–0.1)
Basophils Relative: 1 %
Eosinophils Absolute: 0.1 10*3/uL (ref 0.0–0.5)
Eosinophils Relative: 3 %
HCT: 39.8 % (ref 36.0–46.0)
Hemoglobin: 13.4 g/dL (ref 12.0–15.0)
Immature Granulocytes: 0 %
Lymphocytes Relative: 41 %
Lymphs Abs: 2.2 10*3/uL (ref 0.7–4.0)
MCH: 34 pg (ref 26.0–34.0)
MCHC: 33.7 g/dL (ref 30.0–36.0)
MCV: 101 fL — ABNORMAL HIGH (ref 80.0–100.0)
Monocytes Absolute: 0.6 10*3/uL (ref 0.1–1.0)
Monocytes Relative: 12 %
Neutro Abs: 2.4 10*3/uL (ref 1.7–7.7)
Neutrophils Relative %: 43 %
Platelet Count: 368 10*3/uL (ref 150–400)
RBC: 3.94 MIL/uL (ref 3.87–5.11)
RDW: 14.2 % (ref 11.5–15.5)
WBC Count: 5.4 10*3/uL (ref 4.0–10.5)
nRBC: 0 % (ref 0.0–0.2)

## 2023-01-10 LAB — TSH: TSH: 2.082 u[IU]/mL (ref 0.350–4.500)

## 2023-01-10 MED ORDER — SODIUM CHLORIDE 0.9 % IV SOLN
500.0000 mg/m2 | Freq: Once | INTRAVENOUS | Status: AC
  Administered 2023-01-10: 900 mg via INTRAVENOUS
  Filled 2023-01-10: qty 20

## 2023-01-10 MED ORDER — SODIUM CHLORIDE 0.9 % IV SOLN
200.0000 mg | Freq: Once | INTRAVENOUS | Status: AC
  Administered 2023-01-10: 200 mg via INTRAVENOUS
  Filled 2023-01-10: qty 200

## 2023-01-10 MED ORDER — SODIUM CHLORIDE 0.9% FLUSH: 10.0000 mL | INTRAVENOUS | Status: DC | PRN

## 2023-01-10 MED ORDER — SODIUM CHLORIDE 0.9 % IV SOLN: Freq: Once | INTRAVENOUS | Status: AC

## 2023-01-10 MED ORDER — HEPARIN SOD (PORK) LOCK FLUSH 100 UNIT/ML IV SOLN: 500.0000 [IU] | Freq: Once | INTRAVENOUS | Status: DC | PRN

## 2023-01-10 MED ORDER — PROCHLORPERAZINE MALEATE 10 MG PO TABS
10.0000 mg | ORAL_TABLET | Freq: Once | ORAL | Status: AC
  Administered 2023-01-10: 10 mg via ORAL
  Filled 2023-01-10: qty 1

## 2023-01-10 MED ORDER — CYANOCOBALAMIN 1000 MCG/ML IJ SOLN
1000.0000 ug | Freq: Once | INTRAMUSCULAR | Status: AC
  Administered 2023-01-10: 1000 ug via INTRAMUSCULAR
  Filled 2023-01-10: qty 1

## 2023-01-14 ENCOUNTER — Telehealth: Payer: Self-pay | Admitting: Hematology and Oncology

## 2023-01-17 ENCOUNTER — Other Ambulatory Visit: Payer: Self-pay | Admitting: Hematology and Oncology

## 2023-01-18 ENCOUNTER — Telehealth: Payer: Self-pay | Admitting: Hematology and Oncology

## 2023-01-21 ENCOUNTER — Other Ambulatory Visit: Payer: Self-pay

## 2023-01-21 ENCOUNTER — Encounter: Payer: Self-pay | Admitting: Hematology and Oncology

## 2023-01-21 ENCOUNTER — Other Ambulatory Visit: Payer: Self-pay | Admitting: Hematology and Oncology

## 2023-01-21 MED ORDER — OXYCODONE HCL 5 MG PO TABS: ORAL_TABLET | ORAL | 0 refills | Status: DC

## 2023-01-30 ENCOUNTER — Other Ambulatory Visit: Payer: Self-pay | Admitting: Hematology and Oncology

## 2023-01-30 ENCOUNTER — Encounter: Payer: Self-pay | Admitting: Hematology and Oncology

## 2023-01-30 MED ORDER — OXYCODONE HCL 5 MG PO TABS: ORAL_TABLET | ORAL | 0 refills | Status: DC

## 2023-01-31 ENCOUNTER — Inpatient Hospital Stay (HOSPITAL_BASED_OUTPATIENT_CLINIC_OR_DEPARTMENT_OTHER): Payer: Medicare Other | Admitting: Hematology and Oncology

## 2023-01-31 ENCOUNTER — Encounter: Payer: Self-pay | Admitting: Hematology and Oncology

## 2023-01-31 ENCOUNTER — Other Ambulatory Visit (HOSPITAL_COMMUNITY): Payer: Self-pay

## 2023-01-31 ENCOUNTER — Inpatient Hospital Stay: Payer: Medicare Other

## 2023-01-31 VITALS — BP 120/85 | HR 92 | Temp 98.3°F | Resp 16 | Wt 145.1 lb

## 2023-01-31 VITALS — BP 112/66 | HR 95 | Resp 16 | Ht 62.0 in

## 2023-01-31 DIAGNOSIS — C7931 Secondary malignant neoplasm of brain: Secondary | ICD-10-CM

## 2023-01-31 DIAGNOSIS — C3490 Malignant neoplasm of unspecified part of unspecified bronchus or lung: Secondary | ICD-10-CM

## 2023-01-31 DIAGNOSIS — R918 Other nonspecific abnormal finding of lung field: Secondary | ICD-10-CM

## 2023-01-31 DIAGNOSIS — C7951 Secondary malignant neoplasm of bone: Secondary | ICD-10-CM | POA: Diagnosis not present

## 2023-01-31 DIAGNOSIS — C3431 Malignant neoplasm of lower lobe, right bronchus or lung: Secondary | ICD-10-CM | POA: Diagnosis not present

## 2023-01-31 DIAGNOSIS — Z95828 Presence of other vascular implants and grafts: Secondary | ICD-10-CM

## 2023-01-31 LAB — CBC WITH DIFFERENTIAL (CANCER CENTER ONLY)
Abs Immature Granulocytes: 0.01 10*3/uL (ref 0.00–0.07)
Basophils Absolute: 0 10*3/uL (ref 0.0–0.1)
Basophils Relative: 1 %
Eosinophils Absolute: 0.2 10*3/uL (ref 0.0–0.5)
Eosinophils Relative: 3 %
HCT: 37.5 % (ref 36.0–46.0)
Hemoglobin: 13 g/dL (ref 12.0–15.0)
Immature Granulocytes: 0 %
Lymphocytes Relative: 41 %
Lymphs Abs: 2.4 10*3/uL (ref 0.7–4.0)
MCH: 34.4 pg — ABNORMAL HIGH (ref 26.0–34.0)
MCHC: 34.7 g/dL (ref 30.0–36.0)
MCV: 99.2 fL (ref 80.0–100.0)
Monocytes Absolute: 0.7 10*3/uL (ref 0.1–1.0)
Monocytes Relative: 13 %
Neutro Abs: 2.5 10*3/uL (ref 1.7–7.7)
Neutrophils Relative %: 42 %
Platelet Count: 365 10*3/uL (ref 150–400)
RBC: 3.78 MIL/uL — ABNORMAL LOW (ref 3.87–5.11)
RDW: 13.7 % (ref 11.5–15.5)
WBC Count: 5.8 10*3/uL (ref 4.0–10.5)
nRBC: 0 % (ref 0.0–0.2)

## 2023-01-31 LAB — CMP (CANCER CENTER ONLY)
ALT: 10 U/L (ref 0–44)
AST: 18 U/L (ref 15–41)
Albumin: 3.3 g/dL — ABNORMAL LOW (ref 3.5–5.0)
Alkaline Phosphatase: 56 U/L (ref 38–126)
Anion gap: 6 (ref 5–15)
BUN: 11 mg/dL (ref 8–23)
CO2: 34 mmol/L — ABNORMAL HIGH (ref 22–32)
Calcium: 8.6 mg/dL — ABNORMAL LOW (ref 8.9–10.3)
Chloride: 101 mmol/L (ref 98–111)
Creatinine: 0.67 mg/dL (ref 0.44–1.00)
GFR, Estimated: >60 mL/min (ref >60)
Glucose, Bld: 100 mg/dL — ABNORMAL HIGH (ref 70–99)
Potassium: 3.2 mmol/L — ABNORMAL LOW (ref 3.5–5.1)
Sodium: 141 mmol/L (ref 135–145)
Total Bilirubin: 0.3 mg/dL (ref 0.3–1.2)
Total Protein: 6.8 g/dL (ref 6.5–8.1)

## 2023-01-31 LAB — TSH: TSH: 3.161 u[IU]/mL (ref 0.350–4.500)

## 2023-01-31 MED ORDER — GABAPENTIN 300 MG PO CAPS
300.0000 mg | ORAL_CAPSULE | Freq: Every day | ORAL | 0 refills | Status: DC
  Filled 2023-01-31: qty 30, 30d supply, fill #0

## 2023-01-31 MED ORDER — XTAMPZA ER 13.5 MG PO C12A
1.0000 | EXTENDED_RELEASE_CAPSULE | Freq: Two times a day (BID) | ORAL | 0 refills | Status: DC
  Filled 2023-01-31 – 2023-02-05 (×2): qty 60, 30d supply, fill #0

## 2023-01-31 MED ORDER — PROCHLORPERAZINE MALEATE 10 MG PO TABS
10.0000 mg | ORAL_TABLET | Freq: Once | ORAL | Status: AC
  Administered 2023-01-31: 10 mg via ORAL
  Filled 2023-01-31: qty 1

## 2023-01-31 MED ORDER — SODIUM CHLORIDE 0.9 % IV SOLN: Freq: Once | INTRAVENOUS | Status: AC

## 2023-01-31 MED ORDER — SODIUM CHLORIDE 0.9 % IV SOLN
200.0000 mg | Freq: Once | INTRAVENOUS | Status: AC
  Administered 2023-01-31: 200 mg via INTRAVENOUS
  Filled 2023-01-31: qty 200

## 2023-01-31 MED ORDER — SODIUM CHLORIDE 0.9 % IV SOLN
500.0000 mg/m2 | Freq: Once | INTRAVENOUS | Status: AC
  Administered 2023-01-31: 900 mg via INTRAVENOUS
  Filled 2023-01-31: qty 20

## 2023-01-31 NOTE — Patient Instructions (Signed)
[facility]  Discharge Instructions: Thank you for choosing [facility] to provide your oncology and hematology care.   If you have a lab appointment with the Cancer Center, please go directly to the Cancer Center and check in at the registration area.   Wear comfortable clothing and clothing appropriate for easy access to any Portacath or PICC line.   We strive to give you quality time with your provider. You may need to reschedule your appointment if you arrive late (15 or more minutes).  Arriving late affects you and other patients whose appointments are after yours.  Also, if you miss three or more appointments without notifying the office, you may be dismissed from the clinic at the provider's discretion.      For prescription refill requests, have your pharmacy contact our office and allow 72 hours for refills to be completed.    Today you received the following chemotherapy and/or immunotherapy agents: Keytruda, Alimta.       To help prevent nausea and vomiting after your treatment, we encourage you to take your nausea medication as directed.  BELOW ARE SYMPTOMS THAT SHOULD BE REPORTED IMMEDIATELY: *FEVER GREATER THAN 100.4 F (38 C) OR HIGHER *CHILLS OR SWEATING *NAUSEA AND VOMITING THAT IS NOT CONTROLLED WITH YOUR NAUSEA MEDICATION *UNUSUAL SHORTNESS OF BREATH *UNUSUAL BRUISING OR BLEEDING *URINARY PROBLEMS (pain or burning when urinating, or frequent urination) *BOWEL PROBLEMS (unusual diarrhea, constipation, pain near the anus) TENDERNESS IN MOUTH AND THROAT WITH OR WITHOUT PRESENCE OF ULCERS (sore throat, sores in mouth, or a toothache) UNUSUAL RASH, SWELLING OR PAIN  UNUSUAL VAGINAL DISCHARGE OR ITCHING   Items with * indicate a potential emergency and should be followed up as soon as possible or go to the Emergency Department if any problems should occur.  Please show the CHEMOTHERAPY ALERT CARD or IMMUNOTHERAPY ALERT CARD  at check-in to the Emergency Department and triage nurse.  Should you have questions after your visit or need to cancel or reschedule your appointment, please contact [facility]  Dept: [phone]  and follow the prompts.  Office hours are 8:00 a.m. to 4:30 p.m. Monday - Friday. Please note that voicemails left after 4:00 p.m. may not be returned until the following business day.  We are closed weekends and major holidays. You have access to a nurse at all times for urgent questions. Please call the main number to the clinic Dept: [phone] and follow the prompts.   For any non-urgent questions, you may also contact your provider using MyChart. We now offer e-Visits for anyone 18 and older to request care online for non-urgent symptoms. For details visit [email or web address].   Also download the MyChart app! Go to the app store, search "MyChart", open the app, select , and log in with your MyChart username and password.   

## 2023-01-31 NOTE — Progress Notes (Signed)
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 CT of the head showed showed concerning for metastatic disease involving the left cerebellum and right occipital lobe.  2) 09/30/2019: MRI brain confirms mass within the left cerebellum measures 1.6 x 1.3 x 1.5 cm as well as at least 8 metastatic lesions within the supratentorial and infratentorial brain as outlined 3) 10/01/2019: PET CT scan revealed hypermetabolic infrahilar right lower lobe mass with hypermetabolic nodal metastases in the right hilum, mediastinum, right supraclavicular region, left axilla and lower left neck. 4) 10/08/2019: US guided biopsy of the lymph nodes confirms poorly differentiated non-small cell carcinoma. Immunohistochemistry confirms adenocarcinoma of lung primary. PD-L1 and NGS later revealed a MET Exon 14 mutation and TPS of 90%.  5) 10/12/2019: Establish care with Dr. [name]  6) 11/04/2019: Day 1 of capmatinib 400mg  BID 7) 11/09/2019: presented to Rad/Onc with a DVT in LLE. Seen in symptom management clinic and started on apixaban.  8) 12/29/2019: CT C/A/P showed interval decrease in size of mass involving the superior segment of right lower lobe and reduction in mediastinal and left supraclavicular adenopathy though some small spinal lesions were noted in the interim between the two sets of scans 9) 01/25/2020: temporarily stopped capmatinib due to symptoms of nausea/fatigue. Restarted therapy on 01/30/2020 after brief chemo holiday.  10)  03/22/2020: CT C/A/P reveals a mixed picture, with new lung nodule and increased lymphadenopathy, however response in bone lesions. D/c capmatinib therapy 11) 04/08/2020: start of Carbo/Pem/Pem for 2nd line treatment. Cycle 1 Day 1   12) 04/29/2020: Cycle 2 Day 1 Carbo/Pem/Pem  13) 05/20/2020: Cycle 3 Day 1 Carbo/Pem/Pem  14) 06/09/2020: Cycle 4 Day 1 Carbo/Pem/Pem  15) 06/29/2020: Cycle 5 Day 1 maintenance Pem/Pem  16) 07/20/2020: Cycle 6 Day 1 maintenance Pem/Pem  17) 08/12/2020: Cycle 7 Day 1 maintenance Pem/Pem  18) 09/01/2020: Cycle 8 Day 1 maintenance Pem/Pem  19) 09/21/2020: Cycle 9 Day 1 maintenance Pem/Pem  20) 10/13/2020: Cycle 10 Day 1 maintenance Pem/Pem  21) 11/03/2020: Cycle 11 Day 1 maintenance Pem/Pem  22) 11/25/2020:  Cycle 12 Day 1 maintenance Pem/Pem 23) 12/15/2020:  Cycle 13 Day 1 maintenance Pem/Pem 24) 01/05/2021:  Cycle 14 Day 1 maintenance Pem/Pem 25) 01/26/2021:  Cycle 15 Day 1 maintenance Pem/Pem plus Bevacizumab.  02/17/2021: Cycle 16 Day 1 maintenance Pem/Pem plus Bevacizumab.  03/10/2021: Cycle 17 Day 1 maintenance Pem/Pem plus Bevacizumab.  03/31/2021: Cycle 18 Day 1 maintenance Pem/Pem  04/20/2021:  Cycle 19 Day 1 maintenance Pem/Pem  05/12/2021: Cycle 20 Day 1 maintenance Pem/Pem 06/01/2021: Cycle 21 Day 1 maintenance Pem/Pem 06/22/2021: Cycle 22 Day 1 maintenance Pem/Pem 07/13/2021: Cycle 23 Day 1 maintenance Pem/Pem 08/16/2021: Cycle 24 Day 1 maintenance Pem/Pem 09/08/2021: Cycle 25 Day 1 maintenance Pem/Pem plus Bevacizumab 09/28/2021: Cycle 26 Day 1 maintenance Pem/Pem plus Bevacizumab 10/20/2021: Cycle 27 Day 1 maintenance Pem/Pem plus Bevacizumab 11/09/2021: Cycle 28 Day 1 maintenance Pem/Pem  12/06/2021: Cycle 29 Day 1 maintenance Pem/Pem  12/27/2021: Cycle 30 Day 1 maintenance Pem/Pem  01/18/2022: Cycle 31 Day 1 maintenance Pem/Pem  02/07/2022: Cycle 32 Day  1 maintenance Pem/Pem  02/28/2022: Cycle 33 Day 1 maintenance Pem/Pem  03/23/2022: Cycle 34 Day 1 maintenance Pem/Pem   04/13/2022: Cycle 35 Day 1 maintenance Pem/Pem  05/04/2022: Cycle 36 Day 1 maintenance Pem/Pem  05/25/2022: Cycle 37 Day 1 maintenance Pem/Pem  06/14/2022: Cycle 38 Day 1 maintenance Pem/Pem 07/05/2022: Cycle 39 Day 1 maintenance Pem/Pem 08/13/2022: Cycle 40 Day 1 maintenance Pem/Pem 09/06/2022:Cycle 41 Day 1 maintenance Pem/Pem 09/27/2022: Cycle 42 Day 1 maintenance Pem/Pem 10/19/2022: Cycle 43 Day 1 maintenance Pem/Pem 11/09/2022: Cycle 44 Day 1 maintenance Pem/Pem 11/30/2022: Cycle 45 Day 1 maintenance Pem/Pem 12/20/2022: Cycle 46 Day 1 maintenance Pem/Pem  Interval History:  Virginia [name] 67 y.o. female with medical history significant for metastatic adenocarcinoma of the lung presents for a follow up visit. The patient's last visit was on 12/20/2022.  In the interim since the last visit she has continued on maintenance Pem/Pem.   On exam today Virginia [name] reports she continues to struggle with dizziness and reports that it is about the same to a little worse than prior.  She notes that she continues to have the stiff muscle on her right leg which has not changed in character since her last talk.  She notes that she is interested in getting a massage in order to try to help with this.  She reports the pain in her back is stable and she continues to take about 2-4 oxycodone per day for this.  She notes that she is also back to taking Xtampza twice daily.  Though can sometimes forget a dose at night she goes to bed early.  She has a good supply of Zofran.  Overall she is at her baseline level of health and is willing and able to proceed with chemotherapy at this time.  She denies any fevers, chills, sweats, vomiting, diarrhea.  Full 10 point ROS was otherwise negative  MEDICAL HISTORY:  Past Medical History:  Diagnosis Date   Anemia    Anxiety    Concussion 09/28/2019   DVT (deep venous thrombosis) (HCC) 2021   L leg   Dyspnea    GERD (gastroesophageal reflux disease)    Hypercholesterolemia     per pt, she does not have elevated lipids   Hypertension    met lung ca dx'd 09/2019   mets to spine, hip and brain   PONV (postoperative nausea and vomiting)    Tobacco abuse     SURGICAL HISTORY: Past Surgical History:  Procedure Laterality Date   ABDOMINAL HYSTERECTOMY     partial/ left ovaries   APPLICATION OF CRANIAL NAVIGATION N/A 12/02/2020   Procedure: APPLICATION OF CRANIAL NAVIGATION;  Surgeon: Maeola Harman, MD;  Location: [facility] OR;  Service: Neurosurgery;  Laterality: N/A;   CHOLECYSTECTOMY     CRANIOTOMY N/A 12/02/2020   Procedure: Posterior fossa craniotomy for tumor resection with brainlab;  Surgeon: Maeola Harman, MD;  Location: [facility] OR;  Service: Neurosurgery;  Laterality: N/A;   DILATION AND CURETTAGE OF UTERUS     IR IMAGING GUIDED PORT INSERTION  10/23/2019   IR PATIENT EVAL TECH 0-60 MINS  11/21/2021   IR PATIENT EVAL TECH 0-60 MINS  11/24/2021   IR PATIENT EVAL TECH 0-60 MINS  11/29/2021   IR PATIENT EVAL TECH 0-60 MINS  12/04/2021   IR PATIENT EVAL TECH 0-60 MINS  12/12/2021   IR PATIENT EVAL TECH 0-60 MINS  12/19/2021   IR PATIENT EVAL TECH 0-60 MINS  12/27/2021   IR PATIENT EVAL TECH 0-60 MINS  01/03/2022   IR PATIENT EVAL TECH 0-60 MINS  01/10/2022   IR PATIENT EVAL TECH 0-60 MINS  01/18/2022   IR PATIENT EVAL TECH 0-60 MINS  02/13/2022   IR PATIENT EVAL TECH 0-60 MINS  02/20/2022   IR RADIOLOGIST EVAL & MGMT  01/23/2022   IR REMOVAL TUN ACCESS W/ PORT W/O FL MOD SED  11/17/2021   KYPHOPLASTY N/A 03/15/2020   Procedure: Thoracic Eight KYPHOPLASTY;  Surgeon: Maeola Harman, MD;  Location: [facility] OR;  Service: Neurosurgery;  Laterality: N/A;  prone    LIPOMA EXCISION  2018   removed under left breast and right thigh.   TUBAL LIGATION      ALLERGIES:  has No Known Allergies.  MEDICATIONS:  Current Outpatient Medications  Medication Sig Dispense Refill   gabapentin (NEURONTIN) 300 MG capsule Take 1 capsule (300 mg total) by mouth at bedtime. 30 capsule 0   acetaminophen  (TYLENOL) 500 MG tablet Take 500 mg by mouth 2 (two) times daily as needed for moderate pain.     albuterol (VENTOLIN HFA) 108 (90 Base) MCG/ACT inhaler INHALE 2 PUFFS INTO THE LUNGS EVERY 6 HOURS AS NEEDED FOR WHEEZING OR SHORTNESS OF BREATH 6.7 g 11   ALPRAZolam (XANAX) 0.25 MG tablet Take 1 tablet (0.25 mg total) by mouth 2 (two) times daily as needed for anxiety 60 tablet 0   amLODipine (NORVASC) 10 MG tablet Take 1 tablet (10 mg total) by mouth daily. 90 tablet 3   Cyanocobalamin (VITAMIN B12 PO) Take 1 tablet by mouth daily. Gummies     EPINEPHrine 0.3 mg/0.3 mL IJ SOAJ injection Inject 0.3 mg into the muscle as needed for anaphylaxis. 2 each 0   folic acid (FOLVITE) 1 MG tablet TAKE 1 TABLET BY MOUTH DAILY 90 tablet 1   furosemide (LASIX) 20 MG tablet Take 2 tablets (40 mg total) by mouth 2 times daily. 360 tablet 0   Glucosamine HCl (GLUCOSAMINE PO) Take 2 tablets by mouth daily.     lidocaine-prilocaine (EMLA) cream Apply to port as needed. 30 g 0   LYSINE PO Take 1 tablet by mouth daily.     meclizine (ANTIVERT) 25 MG tablet Take 1 tablet (25 mg total) by mouth 3 (three) times daily as needed for dizziness. 30 tablet 0   Multiple Vitamin (MULTIVITAMIN WITH MINERALS) TABS tablet Take 1 tablet by mouth daily.     Multiple Vitamins-Minerals (AIRBORNE PO) Take 1 tablet by mouth daily.      OLANZapine (ZYPREXA) 2.5 MG tablet TAKE 1 TABLET BY MOUTH AT BEDTIME 90 tablet 1   ondansetron (ZOFRAN) 4 MG tablet TAKE 1 TABLET(4 MG) BY MOUTH EVERY 8 HOURS AS NEEDED FOR NAUSEA OR VOMITING 40 tablet 2   OVER THE COUNTER MEDICATION Take 2 tablets by mouth daily. Beet root     oxyCODONE (OXY IR/ROXICODONE) 5 MG immediate release tablet TAKE 1 TO 2 TABLETS BY MOUTH EVERY 4 HOURS AS NEEDED FOR SEVERE PAIN 90 tablet 0   oxyCODONE (OXY IR/ROXICODONE) 5 MG immediate release tablet TAKE 1 TO 2 TABLETS BY MOUTH EVERY 4 HOURS AS NEEDED FOR SEVERE PAIN 90 tablet 0   pantoprazole (PROTONIX) 40 MG tablet Take 1  tablet (40 mg total) by mouth daily. 90 tablet 3   polyethylene glycol (MIRALAX / GLYCOLAX) 17 g packet Take 17 g by mouth every evening.     potassium chloride SA (KLOR-CON M) 20 MEQ tablet TAKE 2 TABLETS(40 MEQ) BY MOUTH TWICE DAILY 360 tablet 0  Tetrahydrozoline HCl (VISINE OP) Place 1 drop into both eyes daily as needed (irritation).     VITAMIN D PO Take 1 capsule by mouth daily.     vitamin E 180 MG (400 UNITS) capsule Take 400 IU daily x 1 week, then 400 IU BID (Patient taking differently: Take 400 Units by mouth daily.) 60 capsule 4   XTAMPZA ER 13.5 MG C12A Take 1 capsule by mouth in the morning and at bedtime. (every 12 hours) 60 capsule 0   No current facility-administered medications for this visit.    REVIEW OF SYSTEMS:   Constitutional: ( - ) fevers, ( - )  chills , ( - ) night sweats Eyes: ( - ) blurriness of vision, ( - ) double vision, ( - ) watery eyes Ears, nose, mouth, throat, and face: ( - ) mucositis, ( - ) sore throat Respiratory: ( - ) cough, ( + ) dyspnea, ( - ) wheezes Cardiovascular: ( - ) palpitation, ( - ) chest discomfort, ( - ) lower extremity swelling Gastrointestinal:  ( + ) nausea, ( - ) heartburn, ( - ) change in bowel habits Skin: ( - ) abnormal skin rashes Lymphatics: ( - ) new lymphadenopathy, ( - ) easy bruising Neurological: ( - ) numbness, ( - ) tingling, ( - ) new weaknesses Behavioral/Psych: ( - ) mood change, ( - ) new changes  All other systems were reviewed with the patient and are negative.  PHYSICAL EXAMINATION: ECOG PERFORMANCE STATUS: 1 - Symptomatic but completely ambulatory  Vitals:   01/31/23 1131  BP: 120/85  Pulse: 92  Resp: 16  Temp: 98.3 F (36.8 C)  SpO2: 94%     Filed Weights   01/31/23 1131  Weight: 145 lb 1 oz (65.8 kg)      GENERAL: well appearing middle aged Caucasian female in NAD  SKIN: skin color, texture, turgor are normal, no rashes or significant lesions EYES: conjunctiva are pink and non-injected,  sclera clear LUNGS: wheezing at lung bases. clear to auscultation and percussion with normal breathing effort HEART: regular rate & rhythm and no murmurs and bilateral trace pitting lower extremity edema Musculoskeletal: no cyanosis of digits and no clubbing PSYCH: alert & oriented x 3, fluent speech NEURO: no focal motor/sensory deficits  LABORATORY DATA:  I have reviewed the data as listed    Latest Ref Rng & Units 01/31/2023   10:48 AM 01/10/2023   10:59 AM 12/20/2022   10:24 AM  CBC  WBC 4.0 - 10.5 K/uL 5.8  5.4  5.6   Hemoglobin 12.0 - 15.0 g/dL 40.9  81.1  91.4   Hematocrit 36.0 - 46.0 % 37.5  39.8  39.3   Platelets 150 - 400 K/uL 365  368  348        Latest Ref Rng & Units 01/31/2023   10:48 AM 01/10/2023   10:59 AM 12/20/2022   10:24 AM  CMP  Glucose 70 - 99 mg/dL 782  98  956   BUN 8 - 23 mg/dL 11  11  11    Creatinine 0.44 - 1.00 mg/dL 2.13  0.86  5.78   Sodium 135 - 145 mmol/L 141  141  139   Potassium 3.5 - 5.1 mmol/L 3.2  3.5  3.8   Chloride 98 - 111 mmol/L 101  102  104   CO2 22 - 32 mmol/L 34  31  27   Calcium 8.9 - 10.3 mg/dL 8.6  9.0  9.4  Total Protein 6.5 - 8.1 g/dL 6.8  7.4  7.2   Total Bilirubin 0.3 - 1.2 mg/dL 0.3  0.3  0.4   Alkaline Phos 38 - 126 U/L 56  58  60   AST 15 - 41 U/L 18  18  20    ALT 0 - 44 U/L 10  10  12       RADIOGRAPHIC STUDIES: I personally have viewed the radiographic studies below: No results found.   ASSESSMENT & PLAN Virginia [name] 67 y.o. female with medical history significant for metastatic adenocarcinoma of the lung presents for a follow up visit. 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   Previously we discussed Carboplatin/Pembrolizumab/Pemetrexed and the expected side effect from these medications.  We discussed the possible side effects of immunotherapy including colitis, hepatitis, dermatitis, and pneumonitis.  Additionally we discussed the side effects of carboplatin which  include suppression of blood counts, nephrotoxicity, and nausea.  Additionally we discussed pemetrexed which can also have effects on counts and would require supplementation with vitamin B12 and folic acid.  She voiced her understanding of these side effects and treatment plans moving forward.  I also noted that we would be able to drop the chemotherapy agents that she had intolerance to continue with pembrolizumab alone given her high TPS score.  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 Due to worsening dizziness, she resumed this from Cycle 25-27.  PLAN: --Labs from today were reviewed and adequate for treatment.  Labs today show creatinine 0.67, white blood cell count 5.8, hemoglobin 13.0, MCV 99.2, and platelets of 365 --repeat CT CAP on 10/11/2022 showed stable disease with no evidence of progression. Next due in May 2024.  --Continue with Cycle 48, Day 1 of maintenance Pem/Pem. --RTC q 3 weeks for labs, follow up visit prior to Cycle 49.    #Nausea/Vomiting, improving -- provided patient with Zofran 4-8mg  q8h PRN and compazine 10mg  q6H for breakthrough PRN.  She is currently using Phenergan for breakthrough. --continue olanzpaine 2.5 mg PO QHS to help with nausea. Marked improvement since the addition of this medication on 01/29/2020.  --continue to monitor   #Hypokalemia, stable --likely 2/2 to decreased PO intake, hypovolemia, and BP medications --Potassium level 3.8 today.   --Currently on potassium chloride tablets, 40 mEq PO potassium in AM and in PM.   #Pain Control, stable --continue oxycodone 5mg  q4H PRN. Can increase dose to 10mg  q6H if pain is severe. She is taking approximately 2-3 pills per day.  --continue Xtampza 13.5 mg ER BID for long acting support.   --continue to take with Senokot to prevent opioid induced constipation. 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 --nausea as above    #Brain Metastasis, stable --appreciate the assistance of Dr. Barbaraann Cao in treating her brain metastasis and symptoms. --radiation therapy to the brain completed on 11/16/2019.  --MRI brain on 04/29/2020 showed evidence of small brain bleed. Eliquis therapy was discontinued.  --MRI on 11/04/2020 showed concern for progression of intracranial disease.  --On 12/02/2020, patient underwent craniotomy, resection of progressive cerebellar mass by Dr. Venetia Maxon. 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  --MRI on 12/23/2020 showed evidence of radiation necrosis.  Dr. Barbaraann Cao evaluated the patient on 01/02/2021 with recommendations to add IV Bevacizumab 10 mg/kg q 3 weeks ( started 01/26/2021). Completed a full course of this therapy. --Patient was evaluated by Dr. Barbaraann Cao on 08/29/2021 and recommended to resume Bevacizumab due to worsening dizziness.  Which was given from Cycle 25-27.  Plan: --continue to follow with Dr. Barbaraann Cao  --Avastin added back in the patient's chemotherapy regimen. Will restart in 4 weeks from today (out from dental procedure).  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    # Hypertension: --Continue Norvasc to 10 mg daily.  --continue to monitor at home and in clinic.    Orders Placed This Encounter  Procedures   CT CHEST ABDOMEN PELVIS W CONTRAST    Standing Status:   Future    Standing Expiration Date:   01/31/2024    Order Specific Question:   If indicated for the ordered procedure, I authorize the administration of contrast media per  Radiology protocol    Answer:   Yes    Order Specific Question:   Does the patient have a contrast media/X-ray dye allergy?    Answer:   No    Order Specific Question:   Preferred imaging location?    Answer:   [facility]    Order Specific Question:   If indicated for the ordered procedure, I authorize the administration of oral contrast media per Radiology protocol    Answer:   Yes    All questions were answered. The patient knows to call the clinic with any problems, questions or concerns.  I have spent a total of 30 minutes minutes of face-to-face and non-face-to-face time, preparing to see the patient, performing a medically appropriate examination, counseling and educating the patient,  documenting clinical information in the electronic health record, and care coordination.   Ulysees Barns, MD Department of Hematology/Oncology [facility] Phone: [phone] Pager: [phone] Email: [email or web address]   01/31/2023 4:33 PM   Literature Support:  Lynn Ito, Rodrguez-Abreu D, Naaman Plummer, Felip E, De Angelis F, Domine M, [place], Hochmair MJ, [place], [place], [address], [place], Grossi F, [place], Reck M, [place], [place], [place], Rubio-Viqueira B, Novello S, Kurata T, Gray JE, Vida J, Wei Z, Yang J, Raftopoulos H, [employer], [place] [employer]; [id number] Investigators. Pembrolizumab plus Chemotherapy in Metastatic Non-Small-Cell Lung Cancer. Malva Limes Med. 2018 May 31;378(22):2078-2092.  --Median progression-free survival was 8.8 months (95% CI, 7.6 to 9.2) in the pembrolizumab-combination group and 4.9 months (95% CI, 4.7 to 5.5) in the placebo-combination group (hazard ratio for disease progression or death, 0.52; 95% CI, 0.43 to 0.64; P<0.001). Adverse events of grade 3 or higher occurred in 67.2% of the patients in the pembrolizumab-combination group and in 65.8% of those in the placebo-combination  group.  Cristal Generous L. Efficacy of pemetrexed-based regimens in advanced non-small cell lung cancer patients with activating epidermal growth factor receptor mutations after tyrosine kinase inhibitor failure: a systematic review. Onco Targets Ther. 2018;11:2121-2129.   --The weighted median PFS, median OS, and ORR for patients treated with pem regimens were 5.09 months, 15.91 months, and 30.19%, respectively. Our systematic review results showed a favorable efficacy profile of pem regimens in NSCLC patients  with EGFR mutation after EGFR-TKI failure.

## 2023-02-01 ENCOUNTER — Other Ambulatory Visit (HOSPITAL_COMMUNITY): Payer: Self-pay

## 2023-02-05 ENCOUNTER — Other Ambulatory Visit (HOSPITAL_COMMUNITY): Payer: Self-pay

## 2023-02-14 ENCOUNTER — Ambulatory Visit
Admission: RE | Admit: 2023-02-14 | Discharge: 2023-02-14 | Disposition: A | Discharge status: Home / Self Care | Payer: Medicare Other | Source: Ambulatory Visit | Attending: Internal Medicine | Admitting: Internal Medicine

## 2023-02-14 ENCOUNTER — Telehealth: Payer: Self-pay | Admitting: *Deleted

## 2023-02-14 DIAGNOSIS — C7931 Secondary malignant neoplasm of brain: Secondary | ICD-10-CM

## 2023-02-14 DIAGNOSIS — Y842 Radiological procedure and radiotherapy as the cause of abnormal reaction of the patient, or of later complication, without mention of misadventure at the time of the procedure: Secondary | ICD-10-CM

## 2023-02-14 NOTE — Telephone Encounter (Signed)
Returned PC to patient's husband, Harvie Heck - he called earlier to say patient was unable to have her brain MRI today because she was too dizzy to lie down in the machine.  This RN contacted [facility] to reschedule MRI -appointment is 04/01/23 at 12:00, patient to arrive at 11:30.  Patient's MD visit with Dr Barbaraann Cao to be rescheduled to 04/05/23 as she has other [facility] appointments this day.  Randy verbalizes understanding.  Scheduling message sent.

## 2023-02-16 ENCOUNTER — Ambulatory Visit (HOSPITAL_BASED_OUTPATIENT_CLINIC_OR_DEPARTMENT_OTHER)
Admission: RE | Admit: 2023-02-16 | Discharge: 2023-02-16 | Disposition: A | Discharge status: Home / Self Care | Payer: Medicare Other | Source: Ambulatory Visit | Attending: Hematology and Oncology | Admitting: Hematology and Oncology

## 2023-02-16 DIAGNOSIS — C3490 Malignant neoplasm of unspecified part of unspecified bronchus or lung: Secondary | ICD-10-CM

## 2023-02-16 DIAGNOSIS — C7951 Secondary malignant neoplasm of bone: Secondary | ICD-10-CM | POA: Insufficient documentation

## 2023-02-16 MED ORDER — IOHEXOL 300 MG/ML  SOLN
100.0000 mL | Freq: Once | INTRAMUSCULAR | Status: AC | PRN
  Administered 2023-02-16: 80 mL via INTRAVENOUS

## 2023-02-18 ENCOUNTER — Inpatient Hospital Stay: Payer: Medicare Other | Attending: Hematology and Oncology

## 2023-02-18 ENCOUNTER — Ambulatory Visit: Payer: Medicare Other | Admitting: Internal Medicine

## 2023-02-18 DIAGNOSIS — R42 Dizziness and giddiness: Secondary | ICD-10-CM | POA: Insufficient documentation

## 2023-02-18 DIAGNOSIS — Z7901 Long term (current) use of anticoagulants: Secondary | ICD-10-CM | POA: Insufficient documentation

## 2023-02-18 DIAGNOSIS — R5383 Other fatigue: Secondary | ICD-10-CM | POA: Insufficient documentation

## 2023-02-18 DIAGNOSIS — I3139 Other pericardial effusion (noninflammatory): Secondary | ICD-10-CM | POA: Insufficient documentation

## 2023-02-18 DIAGNOSIS — Z5112 Encounter for antineoplastic immunotherapy: Secondary | ICD-10-CM | POA: Insufficient documentation

## 2023-02-18 DIAGNOSIS — Z9049 Acquired absence of other specified parts of digestive tract: Secondary | ICD-10-CM | POA: Insufficient documentation

## 2023-02-18 DIAGNOSIS — I700 Atherosclerosis of aorta: Secondary | ICD-10-CM | POA: Insufficient documentation

## 2023-02-18 DIAGNOSIS — R0600 Dyspnea, unspecified: Secondary | ICD-10-CM | POA: Insufficient documentation

## 2023-02-18 DIAGNOSIS — E876 Hypokalemia: Secondary | ICD-10-CM | POA: Insufficient documentation

## 2023-02-18 DIAGNOSIS — Z5111 Encounter for antineoplastic chemotherapy: Secondary | ICD-10-CM | POA: Insufficient documentation

## 2023-02-18 DIAGNOSIS — I2510 Atherosclerotic heart disease of native coronary artery without angina pectoris: Secondary | ICD-10-CM | POA: Insufficient documentation

## 2023-02-18 DIAGNOSIS — C3431 Malignant neoplasm of lower lobe, right bronchus or lung: Secondary | ICD-10-CM | POA: Insufficient documentation

## 2023-02-18 DIAGNOSIS — R110 Nausea: Secondary | ICD-10-CM | POA: Insufficient documentation

## 2023-02-18 DIAGNOSIS — Z86718 Personal history of other venous thrombosis and embolism: Secondary | ICD-10-CM | POA: Insufficient documentation

## 2023-02-18 DIAGNOSIS — C7931 Secondary malignant neoplasm of brain: Secondary | ICD-10-CM | POA: Insufficient documentation

## 2023-02-18 DIAGNOSIS — Z90710 Acquired absence of both cervix and uterus: Secondary | ICD-10-CM | POA: Insufficient documentation

## 2023-02-18 DIAGNOSIS — Z79899 Other long term (current) drug therapy: Secondary | ICD-10-CM | POA: Insufficient documentation

## 2023-02-21 ENCOUNTER — Inpatient Hospital Stay (HOSPITAL_BASED_OUTPATIENT_CLINIC_OR_DEPARTMENT_OTHER): Payer: Medicare Other | Admitting: Hematology and Oncology

## 2023-02-21 ENCOUNTER — Other Ambulatory Visit: Payer: Self-pay

## 2023-02-21 ENCOUNTER — Inpatient Hospital Stay: Payer: Medicare Other

## 2023-02-21 ENCOUNTER — Ambulatory Visit: Payer: Medicare Other | Admitting: Internal Medicine

## 2023-02-21 VITALS — BP 111/74 | HR 96 | Temp 98.1°F | Resp 16

## 2023-02-21 VITALS — BP 113/79 | HR 95 | Temp 98.1°F | Resp 16 | Wt 145.9 lb

## 2023-02-21 DIAGNOSIS — R0600 Dyspnea, unspecified: Secondary | ICD-10-CM | POA: Diagnosis not present

## 2023-02-21 DIAGNOSIS — R110 Nausea: Secondary | ICD-10-CM | POA: Diagnosis not present

## 2023-02-21 DIAGNOSIS — R918 Other nonspecific abnormal finding of lung field: Secondary | ICD-10-CM | POA: Diagnosis not present

## 2023-02-21 DIAGNOSIS — C7931 Secondary malignant neoplasm of brain: Secondary | ICD-10-CM

## 2023-02-21 DIAGNOSIS — C3431 Malignant neoplasm of lower lobe, right bronchus or lung: Secondary | ICD-10-CM | POA: Diagnosis present

## 2023-02-21 DIAGNOSIS — Z95828 Presence of other vascular implants and grafts: Secondary | ICD-10-CM | POA: Diagnosis not present

## 2023-02-21 DIAGNOSIS — Z7901 Long term (current) use of anticoagulants: Secondary | ICD-10-CM | POA: Diagnosis not present

## 2023-02-21 DIAGNOSIS — I3139 Other pericardial effusion (noninflammatory): Secondary | ICD-10-CM | POA: Diagnosis not present

## 2023-02-21 DIAGNOSIS — I700 Atherosclerosis of aorta: Secondary | ICD-10-CM | POA: Diagnosis not present

## 2023-02-21 DIAGNOSIS — Z86718 Personal history of other venous thrombosis and embolism: Secondary | ICD-10-CM | POA: Diagnosis not present

## 2023-02-21 DIAGNOSIS — Z90710 Acquired absence of both cervix and uterus: Secondary | ICD-10-CM | POA: Diagnosis not present

## 2023-02-21 DIAGNOSIS — Z5111 Encounter for antineoplastic chemotherapy: Secondary | ICD-10-CM | POA: Diagnosis present

## 2023-02-21 DIAGNOSIS — R5383 Other fatigue: Secondary | ICD-10-CM | POA: Diagnosis not present

## 2023-02-21 DIAGNOSIS — I2510 Atherosclerotic heart disease of native coronary artery without angina pectoris: Secondary | ICD-10-CM | POA: Diagnosis not present

## 2023-02-21 DIAGNOSIS — Z5112 Encounter for antineoplastic immunotherapy: Secondary | ICD-10-CM | POA: Diagnosis present

## 2023-02-21 DIAGNOSIS — Z79899 Other long term (current) drug therapy: Secondary | ICD-10-CM | POA: Diagnosis not present

## 2023-02-21 DIAGNOSIS — R42 Dizziness and giddiness: Secondary | ICD-10-CM | POA: Diagnosis not present

## 2023-02-21 DIAGNOSIS — Z9049 Acquired absence of other specified parts of digestive tract: Secondary | ICD-10-CM | POA: Diagnosis not present

## 2023-02-21 DIAGNOSIS — E876 Hypokalemia: Secondary | ICD-10-CM | POA: Diagnosis not present

## 2023-02-21 LAB — CMP (CANCER CENTER ONLY)
ALT: 11 U/L (ref 0–44)
AST: 18 U/L (ref 15–41)
Albumin: 3 g/dL — ABNORMAL LOW (ref 3.5–5.0)
Alkaline Phosphatase: 54 U/L (ref 38–126)
Anion gap: 7 (ref 5–15)
BUN: 16 mg/dL (ref 8–23)
CO2: 30 mmol/L (ref 22–32)
Calcium: 9 mg/dL (ref 8.9–10.3)
Chloride: 103 mmol/L (ref 98–111)
Creatinine: 0.75 mg/dL (ref 0.44–1.00)
GFR, Estimated: >60 mL/min (ref >60)
Glucose, Bld: 131 mg/dL — ABNORMAL HIGH (ref 70–99)
Potassium: 3.4 mmol/L — ABNORMAL LOW (ref 3.5–5.1)
Sodium: 140 mmol/L (ref 135–145)
Total Bilirubin: 0.3 mg/dL (ref 0.3–1.2)
Total Protein: 6.9 g/dL (ref 6.5–8.1)

## 2023-02-21 LAB — CBC WITH DIFFERENTIAL (CANCER CENTER ONLY)
Abs Immature Granulocytes: 0.02 10*3/uL (ref 0.00–0.07)
Basophils Absolute: 0 10*3/uL (ref 0.0–0.1)
Basophils Relative: 1 %
Eosinophils Absolute: 0.2 10*3/uL (ref 0.0–0.5)
Eosinophils Relative: 3 %
HCT: 38.9 % (ref 36.0–46.0)
Hemoglobin: 13.1 g/dL (ref 12.0–15.0)
Immature Granulocytes: 0 %
Lymphocytes Relative: 35 %
Lymphs Abs: 2 10*3/uL (ref 0.7–4.0)
MCH: 33.9 pg (ref 26.0–34.0)
MCHC: 33.7 g/dL (ref 30.0–36.0)
MCV: 100.8 fL — ABNORMAL HIGH (ref 80.0–100.0)
Monocytes Absolute: 0.7 10*3/uL (ref 0.1–1.0)
Monocytes Relative: 13 %
Neutro Abs: 2.7 10*3/uL (ref 1.7–7.7)
Neutrophils Relative %: 48 %
Platelet Count: 391 10*3/uL (ref 150–400)
RBC: 3.86 MIL/uL — ABNORMAL LOW (ref 3.87–5.11)
RDW: 13.7 % (ref 11.5–15.5)
WBC Count: 5.6 10*3/uL (ref 4.0–10.5)
nRBC: 0 % (ref 0.0–0.2)

## 2023-02-21 LAB — TSH: TSH: 4.496 u[IU]/mL (ref 0.350–4.500)

## 2023-02-21 MED ORDER — ALTEPLASE 2 MG IJ SOLR: 2.0000 mg | Freq: Once | INTRAMUSCULAR | Status: DC | PRN

## 2023-02-21 MED ORDER — HEPARIN SOD (PORK) LOCK FLUSH 100 UNIT/ML IV SOLN: 500.0000 [IU] | Freq: Once | INTRAVENOUS | Status: DC | PRN

## 2023-02-21 MED ORDER — SODIUM CHLORIDE 0.9 % IV SOLN
200.0000 mg | Freq: Once | INTRAVENOUS | Status: AC
  Administered 2023-02-21: 200 mg via INTRAVENOUS
  Filled 2023-02-21: qty 200

## 2023-02-21 MED ORDER — SODIUM CHLORIDE 0.9 % IV SOLN
500.0000 mg/m2 | Freq: Once | INTRAVENOUS | Status: AC
  Administered 2023-02-21: 900 mg via INTRAVENOUS
  Filled 2023-02-21: qty 20

## 2023-02-21 MED ORDER — SODIUM CHLORIDE 0.9% FLUSH: 3.0000 mL | INTRAVENOUS | Status: DC | PRN

## 2023-02-21 MED ORDER — SODIUM CHLORIDE 0.9% FLUSH: 10.0000 mL | INTRAVENOUS | Status: DC | PRN

## 2023-02-21 MED ORDER — HEPARIN SOD (PORK) LOCK FLUSH 100 UNIT/ML IV SOLN: 250.0000 [IU] | Freq: Once | INTRAVENOUS | Status: DC | PRN

## 2023-02-21 MED ORDER — SODIUM CHLORIDE 0.9 % IV SOLN: Freq: Once | INTRAVENOUS | Status: AC

## 2023-02-21 MED ORDER — PROCHLORPERAZINE MALEATE 10 MG PO TABS
10.0000 mg | ORAL_TABLET | Freq: Once | ORAL | Status: AC
  Administered 2023-02-21: 10 mg via ORAL
  Filled 2023-02-21: qty 1

## 2023-02-21 NOTE — Progress Notes (Signed)
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 CT of the head showed showed concerning for metastatic disease involving the left cerebellum and right occipital lobe.  2) 09/30/2019: MRI brain confirms mass within the left cerebellum measures 1.6 x 1.3 x 1.5 cm as well as at least 8 metastatic lesions within the supratentorial and infratentorial brain as outlined 3) 10/01/2019: PET CT scan revealed hypermetabolic infrahilar right lower lobe mass with hypermetabolic nodal metastases in the right hilum, mediastinum, right supraclavicular region, left axilla and lower left neck. 4) 10/08/2019: US guided biopsy of the lymph nodes confirms poorly differentiated non-small cell carcinoma. Immunohistochemistry confirms adenocarcinoma of lung primary. PD-L1 and NGS later revealed a MET Exon 14 mutation and TPS of 90%.  5) 10/12/2019: Establish care with Dr. Lorenso Courier  6) 11/04/2019: Day 1 of capmatinib 427m BID 7) 11/09/2019: presented to Rad/Onc with a DVT in LLE. Seen in symptom management clinic and started on apixaban.  8) 12/29/2019: CT C/A/P showed interval decrease in size of mass involving the superior segment of right lower lobe and reduction in mediastinal and left supraclavicular adenopathy though some small spinal lesions were noted in the interim between the two sets of scans 9) 01/25/2020: temporarily stopped capmatinib due to symptoms of nausea/fatigue. Restarted therapy on 01/30/2020 after brief chemo holiday.  10)  03/22/2020: CT C/A/P reveals a mixed picture, with new lung nodule and increased lymphadenopathy, however response in bone lesions. D/c capmatinib therapy 11) 04/08/2020: start of Carbo/Pem/Pem for 2nd line treatment. Cycle 1 Day 1   12) 04/29/2020: Cycle 2 Day 1 Carbo/Pem/Pem  13) 05/20/2020: Cycle 3 Day 1 Carbo/Pem/Pem  14) 06/09/2020: Cycle 4 Day 1 Carbo/Pem/Pem  15) 06/29/2020: Cycle 5 Day 1 maintenance Pem/Pem  16) 07/20/2020: Cycle 6 Day 1 maintenance Pem/Pem  17) 08/12/2020: Cycle 7 Day 1 maintenance Pem/Pem  18) 09/01/2020: Cycle 8 Day 1 maintenance Pem/Pem  19) 09/21/2020: Cycle 9 Day 1 maintenance Pem/Pem  20) 10/13/2020: Cycle 10 Day 1 maintenance Pem/Pem  21) 11/03/2020: Cycle 11 Day 1 maintenance Pem/Pem  22) 11/25/2020:  Cycle 12 Day 1 maintenance Pem/Pem 23) 12/15/2020:  Cycle 13 Day 1 maintenance Pem/Pem 24) 01/05/2021:  Cycle 14 Day 1 maintenance Pem/Pem 25) 01/26/2021:  Cycle 15 Day 1 maintenance Pem/Pem plus Bevacizumab.  02/17/2021: Cycle 16 Day 1 maintenance Pem/Pem plus Bevacizumab.  03/10/2021: Cycle 17 Day 1 maintenance Pem/Pem plus Bevacizumab.  03/31/2021: Cycle 18 Day 1 maintenance Pem/Pem  04/20/2021:  Cycle 19 Day 1 maintenance Pem/Pem  05/12/2021: Cycle 20 Day 1 maintenance Pem/Pem 06/01/2021: Cycle 21 Day 1 maintenance Pem/Pem 06/22/2021: Cycle 22 Day 1 maintenance Pem/Pem 07/13/2021: Cycle 23 Day 1 maintenance Pem/Pem 08/16/2021: Cycle 24 Day 1 maintenance Pem/Pem 09/08/2021: Cycle 25 Day 1 maintenance Pem/Pem plus Bevacizumab 09/28/2021: Cycle 26 Day 1 maintenance Pem/Pem plus Bevacizumab 10/20/2021: Cycle 27 Day 1 maintenance Pem/Pem plus Bevacizumab 11/09/2021: Cycle 28 Day 1 maintenance Pem/Pem  12/06/2021: Cycle 29 Day 1 maintenance Pem/Pem  12/27/2021: Cycle 30 Day 1 maintenance Pem/Pem  01/18/2022: Cycle 31 Day 1 maintenance Pem/Pem  02/07/2022: Cycle 32 Day  1 maintenance Pem/Pem  02/28/2022: Cycle 33 Day 1 maintenance Pem/Pem  03/23/2022: Cycle 34 Day 1 maintenance Pem/Pem   04/13/2022: Cycle 35 Day 1 maintenance Pem/Pem  05/04/2022: Cycle 36 Day 1 maintenance Pem/Pem  05/25/2022: Cycle 37 Day 1 maintenance Pem/Pem  06/14/2022: Cycle 38 Day 1 maintenance Pem/Pem 07/05/2022: Cycle 39 Day 1 maintenance Pem/Pem 08/13/2022: Cycle 40 Day 1 maintenance Pem/Pem 09/06/2022:Cycle 41 Day 1 maintenance Pem/Pem 09/27/2022: Cycle 42 Day 1 maintenance Pem/Pem 10/19/2022: Cycle 43 Day 1 maintenance Pem/Pem 11/09/2022: Cycle 44 Day 1 maintenance Pem/Pem 11/30/2022: Cycle 45 Day 1 maintenance Pem/Pem 12/20/2022: Cycle 46 Day 1 maintenance Pem/Pem 01/10/2023: Cycle 47 Day 1 maintenance Pem/Pem 01/31/2023:Cycle 48 Day 1 maintenance Pem/Pem 02/22/2023: Cycle 49 Day 1 maintenance Pem/Pem  Interval History:  Virginia [name] 67 y.o. female with medical history significant for metastatic adenocarcinoma of the lung presents for a follow up visit. The patient's last visit was on 01/31/2023.  In the interim since the last visit she has continued on maintenance Pem/Pem.   On exam today Virginia [name] reports she continues to be very dizzy.  She notes that it is about the same as it was previously but some days it can worsen.  She continues to eat well but she does think that she is now sleeping more.  She is currently using a CPAP machine, and her husband currently uses a CPAP "nose pillow".  She notes unfortunately she feels "tired all the time".  Overall she is willing and able to proceed with treatment at this time.  She does not have any major toxicity as a result of this treatment.  She denies any fevers, chills, sweats, vomiting, diarrhea.  Full 10 point ROS was otherwise negative  MEDICAL HISTORY:  Past Medical History:  Diagnosis Date   Anemia    Anxiety    Concussion 09/28/2019   DVT (deep venous thrombosis) (HCC) 2021   L leg   Dyspnea    GERD (gastroesophageal reflux disease)    Hypercholesterolemia    per pt, she does not have elevated lipids   Hypertension    met lung ca dx'd 09/2019    mets to spine, hip and brain   PONV (postoperative nausea and vomiting)    Tobacco abuse     SURGICAL HISTORY: Past Surgical History:  Procedure Laterality Date   ABDOMINAL HYSTERECTOMY     partial/ left ovaries   APPLICATION OF CRANIAL NAVIGATION N/A 12/02/2020   Procedure: APPLICATION OF CRANIAL NAVIGATION;  Surgeon: Maeola Harman, MD;  Location: [facility] OR;  Service: Neurosurgery;  Laterality: N/A;   CHOLECYSTECTOMY     CRANIOTOMY N/A 12/02/2020   Procedure: Posterior fossa craniotomy for tumor resection with brainlab;  Surgeon: Maeola Harman, MD;  Location: [facility] OR;  Service: Neurosurgery;  Laterality: N/A;   DILATION AND CURETTAGE OF UTERUS     IR IMAGING GUIDED PORT INSERTION  10/23/2019   IR PATIENT EVAL TECH 0-60 MINS  11/21/2021   IR PATIENT EVAL TECH 0-60 MINS  11/24/2021   IR PATIENT EVAL TECH 0-60 MINS  11/29/2021   IR PATIENT EVAL TECH 0-60 MINS  12/04/2021   IR PATIENT EVAL TECH 0-60 MINS  12/12/2021   IR PATIENT EVAL TECH 0-60 MINS  12/19/2021   IR PATIENT EVAL TECH 0-60 MINS  12/27/2021   IR PATIENT EVAL TECH 0-60 MINS  01/03/2022   IR PATIENT EVAL TECH 0-60 MINS  01/10/2022   IR PATIENT EVAL TECH 0-60 MINS  01/18/2022   IR PATIENT EVAL TECH  0-60 MINS  02/13/2022   IR PATIENT EVAL TECH 0-60 MINS  02/20/2022   IR RADIOLOGIST EVAL & MGMT  01/23/2022   IR REMOVAL TUN ACCESS W/ PORT W/O FL MOD SED  11/17/2021   KYPHOPLASTY N/A 03/15/2020   Procedure: Thoracic Eight KYPHOPLASTY;  Surgeon: Maeola Harman, MD;  Location: [facility] OR;  Service: Neurosurgery;  Laterality: N/A;  prone    LIPOMA EXCISION  2018   removed under left breast and right thigh.   TUBAL LIGATION      ALLERGIES:  has No Known Allergies.  MEDICATIONS:  Current Outpatient Medications  Medication Sig Dispense Refill   acetaminophen (TYLENOL) 500 MG tablet Take 500 mg by mouth 2 (two) times daily as needed for moderate pain.     albuterol (VENTOLIN HFA) 108 (90 Base) MCG/ACT inhaler INHALE 2 PUFFS INTO THE LUNGS EVERY 6 HOURS AS  NEEDED FOR WHEEZING OR SHORTNESS OF BREATH 6.7 g 11   ALPRAZolam (XANAX) 0.25 MG tablet Take 1 tablet (0.25 mg total) by mouth 2 (two) times daily as needed for anxiety 60 tablet 0   amLODipine (NORVASC) 10 MG tablet Take 1 tablet (10 mg total) by mouth daily. 90 tablet 3   Cyanocobalamin (VITAMIN B12 PO) Take 1 tablet by mouth daily. Gummies     EPINEPHrine 0.3 mg/0.3 mL IJ SOAJ injection Inject 0.3 mg into the muscle as needed for anaphylaxis. 2 each 0   folic acid (FOLVITE) 1 MG tablet TAKE 1 TABLET BY MOUTH DAILY 90 tablet 1   furosemide (LASIX) 20 MG tablet Take 2 tablets (40 mg total) by mouth 2 times daily. 360 tablet 0   gabapentin (NEURONTIN) 300 MG capsule Take 1 capsule (300 mg total) by mouth at bedtime. 30 capsule 0   Glucosamine HCl (GLUCOSAMINE PO) Take 2 tablets by mouth daily.     LYSINE PO Take 1 tablet by mouth daily.     meclizine (ANTIVERT) 25 MG tablet Take 1 tablet (25 mg total) by mouth 3 (three) times daily as needed for dizziness. 30 tablet 0   Multiple Vitamin (MULTIVITAMIN WITH MINERALS) TABS tablet Take 1 tablet by mouth daily.     Multiple Vitamins-Minerals (AIRBORNE PO) Take 1 tablet by mouth daily.      OLANZapine (ZYPREXA) 2.5 MG tablet TAKE 1 TABLET BY MOUTH AT BEDTIME 90 tablet 1   ondansetron (ZOFRAN) 4 MG tablet TAKE 1 TABLET(4 MG) BY MOUTH EVERY 8 HOURS AS NEEDED FOR NAUSEA OR VOMITING 40 tablet 2   OVER THE COUNTER MEDICATION Take 2 tablets by mouth daily. Beet root     oxyCODONE (OXY IR/ROXICODONE) 5 MG immediate release tablet TAKE 1 TO 2 TABLETS BY MOUTH EVERY 4 HOURS AS NEEDED FOR SEVERE PAIN 90 tablet 0   oxyCODONE (OXY IR/ROXICODONE) 5 MG immediate release tablet TAKE 1 TO 2 TABLETS BY MOUTH EVERY 4 HOURS AS NEEDED FOR SEVERE PAIN 90 tablet 0   pantoprazole (PROTONIX) 40 MG tablet Take 1 tablet (40 mg total) by mouth daily. 90 tablet 3   polyethylene glycol (MIRALAX / GLYCOLAX) 17 g packet Take 17 g by mouth every evening.     potassium chloride SA  (KLOR-CON M) 20 MEQ tablet TAKE 2 TABLETS(40 MEQ) BY MOUTH TWICE DAILY 360 tablet 0   Tetrahydrozoline HCl (VISINE OP) Place 1 drop into both eyes daily as needed (irritation).     VITAMIN D PO Take 1 capsule by mouth daily.     vitamin E 180 MG (400 UNITS) capsule Take  400 IU daily x 1 week, then 400 IU BID (Patient taking differently: Take 400 Units by mouth daily.) 60 capsule 4   XTAMPZA ER 13.5 MG C12A Take 1 capsule by mouth in the morning and at bedtime. (every 12 hours) 60 capsule 0   No current facility-administered medications for this visit.    REVIEW OF SYSTEMS:   Constitutional: ( - ) fevers, ( - )  chills , ( - ) night sweats Eyes: ( - ) blurriness of vision, ( - ) double vision, ( - ) watery eyes Ears, nose, mouth, throat, and face: ( - ) mucositis, ( - ) sore throat Respiratory: ( - ) cough, ( + ) dyspnea, ( - ) wheezes Cardiovascular: ( - ) palpitation, ( - ) chest discomfort, ( - ) lower extremity swelling Gastrointestinal:  ( + ) nausea, ( - ) heartburn, ( - ) change in bowel habits Skin: ( - ) abnormal skin rashes Lymphatics: ( - ) new lymphadenopathy, ( - ) easy bruising Neurological: ( - ) numbness, ( - ) tingling, ( - ) new weaknesses Behavioral/Psych: ( - ) mood change, ( - ) new changes  All other systems were reviewed with the patient and are negative.  PHYSICAL EXAMINATION: ECOG PERFORMANCE STATUS: 1 - Symptomatic but completely ambulatory  Vitals:   02/21/23 1136  BP: 113/79  Pulse: 95  Resp: 16  Temp: 98.1 F (36.7 C)  SpO2: 97%      Filed Weights   02/21/23 1136  Weight: 145 lb 14.4 oz (66.2 kg)       GENERAL: well appearing middle aged Caucasian female in NAD  SKIN: skin color, texture, turgor are normal, no rashes or significant lesions EYES: conjunctiva are pink and non-injected, sclera clear LUNGS: wheezing at lung bases. clear to auscultation and percussion with normal breathing effort HEART: regular rate & rhythm and no murmurs and  bilateral trace pitting lower extremity edema Musculoskeletal: no cyanosis of digits and no clubbing PSYCH: alert & oriented x 3, fluent speech NEURO: no focal motor/sensory deficits  LABORATORY DATA:  I have reviewed the data as listed    Latest Ref Rng & Units 02/21/2023   10:49 AM 01/31/2023   10:48 AM 01/10/2023   10:59 AM  CBC  WBC 4.0 - 10.5 K/uL 5.6  5.8  5.4   Hemoglobin 12.0 - 15.0 g/dL 16.1  09.6  04.5   Hematocrit 36.0 - 46.0 % 38.9  37.5  39.8   Platelets 150 - 400 K/uL 391  365  368        Latest Ref Rng & Units 02/21/2023   10:49 AM 01/31/2023   10:48 AM 01/10/2023   10:59 AM  CMP  Glucose 70 - 99 mg/dL 409  811  98   BUN 8 - 23 mg/dL 16  11  11    Creatinine 0.44 - 1.00 mg/dL 9.14  7.82  9.56   Sodium 135 - 145 mmol/L 140  141  141   Potassium 3.5 - 5.1 mmol/L 3.4  3.2  3.5   Chloride 98 - 111 mmol/L 103  101  102   CO2 22 - 32 mmol/L 30  34  31   Calcium 8.9 - 10.3 mg/dL 9.0  8.6  9.0   Total Protein 6.5 - 8.1 g/dL 6.9  6.8  7.4   Total Bilirubin 0.3 - 1.2 mg/dL 0.3  0.3  0.3   Alkaline Phos 38 - 126 U/L 54  56  58  AST 15 - 41 U/L 18  18  18    ALT 0 - 44 U/L 11  10  10       RADIOGRAPHIC STUDIES: I personally have viewed the radiographic studies below: CT CHEST ABDOMEN PELVIS W CONTRAST  Result Date: 02/20/2023 CLINICAL DATA:  Metastatic non-small cell lung cancer restaging * Tracking Code: BO * EXAM: CT CHEST, ABDOMEN, AND PELVIS WITH CONTRAST TECHNIQUE: Multidetector CT imaging of the chest, abdomen and pelvis was performed following the standard protocol during bolus administration of intravenous contrast. RADIATION DOSE REDUCTION: This exam was performed according to the departmental dose-optimization program which includes automated exposure control, adjustment of the mA and/or kV according to patient size and/or use of iterative reconstruction technique. CONTRAST:  80mL OMNIPAQUE IOHEXOL 300 MG/ML  SOLN COMPARISON:  10/10/2022 FINDINGS: CT CHEST FINDINGS  Cardiovascular: Aortic atherosclerosis. Normal heart size. Left coronary artery calcifications. Unchanged small pericardial effusion. Mediastinum/Nodes: Unchanged matted soft tissue throughout the mediastinum without discretely enlarged lymph nodes (series 2, image 25). Thyroid gland, trachea, and esophagus demonstrate no significant findings. Lungs/Pleura: Unchanged post treatment scarring of the infrahilar right lung (series 4, image 84) as well as scarring or atelectasis of the right lung base (series 4, image 70). Numerous small bilateral pulmonary nodules and scattered ground-glass nodules throughout the lungs, with a background of innumerable very tiny centrilobular nodules. No pleural effusion or pneumothorax. Musculoskeletal: No chest wall abnormality. No acute osseous findings. Unchanged wedge deformity of T8 with vertebral cement augmentation (series 6, image 81). Unchanged, scattered, ill-defined heterogeneous sclerosis throughout the remaining vertebral bodies. CT ABDOMEN PELVIS FINDINGS Hepatobiliary: No focal liver abnormality is seen. Status post cholecystectomy. Unchanged mild postoperative biliary ductal dilatation. Pancreas: Unremarkable. No pancreatic ductal dilatation or surrounding inflammatory changes. Spleen: Normal in size without significant abnormality. Adrenals/Urinary Tract: Adrenal glands are unremarkable. Kidneys are normal, without renal calculi, solid lesion, or hydronephrosis. Bladder is unremarkable. Stomach/Bowel: Stomach is within normal limits. Appendix appears normal. No evidence of bowel wall thickening, distention, or inflammatory changes. Descending and sigmoid diverticulosis. Vascular/Lymphatic: Aortic atherosclerosis. No enlarged abdominal or pelvic lymph nodes. Reproductive: Status post hysterectomy. Other: No abdominal wall hernia or abnormality. No ascites. Musculoskeletal: No acute osseous findings. IMPRESSION: 1. Unchanged post treatment scarring of the infrahilar  right lung as well as scarring or atelectasis of the right lung base. 2. Numerous small bilateral pulmonary nodules and scattered ground-glass nodules throughout the lungs, with a background of innumerable very tiny centrilobular nodules. Nodules are generally nonspecific and may reflect smoking-related respiratory bronchiolitis and or sequelae of other prior infection or inflammation. Attention on follow-up. 3. Unchanged matted soft tissue throughout the mediastinum without discretely enlarged lymph nodes consistent with treated lymphadenopathy. 4. Unchanged wedge deformity of T8 status post vertebral cement augmentation. Unchanged, scattered, ill-defined heterogeneous sclerosis throughout the remaining vertebral bodies. 5. No evidence of soft tissue metastatic disease in the abdomen or pelvis. 6. Unchanged small pericardial effusion. 7. Coronary artery disease. Aortic Atherosclerosis (ICD10-I70.0). Electronically Signed   By: Jearld Lesch M.D.   On: 02/20/2023 22:58     ASSESSMENT & PLAN KENNETRA ANAGNOSTOPOULOS 67 y.o. female with medical history significant for metastatic adenocarcinoma of the lung presents for a follow up visit. 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   Previously we discussed Carboplatin/Pembrolizumab/Pemetrexed and the expected side effect from these medications.  We discussed the possible side effects of immunotherapy including colitis, hepatitis, dermatitis, and pneumonitis.  Additionally we discussed the side  effects of carboplatin which include suppression of blood counts, nephrotoxicity, and nausea.  Additionally we discussed pemetrexed which can also have effects on counts and would require supplementation with vitamin B12 and folic acid.  She voiced her understanding of these side effects and treatment plans moving forward.  I also noted that we would be able to drop the chemotherapy agents that she had intolerance to continue with pembrolizumab  alone given her high TPS score.  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 Due to worsening dizziness, she resumed this from Cycle 25-27.  PLAN: --Labs from today were reviewed and adequate for treatment.  Labs today show creatinine 0.75, white blood cell count 5.6, hemoglobin 13.1, MCV 100.8, and platelets of 391 --repeat CT CAP on 10/11/2022 showed stable disease with no evidence of progression. Next due in May 2024.  --Continue with Cycle 49, Day 1 of maintenance Pem/Pem. --RTC q 3 weeks for labs, follow up visit prior to Cycle 50.    #Nausea/Vomiting, improving -- provided patient with Zofran 4-8mg  q8h PRN and compazine 10mg  q6H for breakthrough PRN.  She is currently using Phenergan for breakthrough. --continue olanzpaine 2.5 mg PO QHS to help with nausea. Marked improvement since the addition of this medication on 01/29/2020.  --continue to monitor   #Hypokalemia, stable --likely 2/2 to decreased PO intake, hypovolemia, and BP medications --Potassium level 3.4 today.   --Currently on potassium chloride tablets, 40 mEq PO potassium in AM and in PM.   #Pain Control, stable --continue oxycodone 5mg  q4H PRN. Can increase dose to 10mg  q6H if pain is severe. She is taking approximately 2-3 pills per day.  --continue Xtampza 13.5 mg ER BID for long acting support.  --continue to take with Senokot to prevent opioid induced constipation. 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 --nausea as above    #Brain Metastasis, stable --appreciate the assistance of Dr. Barbaraann Cao in treating her brain metastasis and symptoms. --radiation therapy to the brain completed on 11/16/2019.  --MRI  brain on 04/29/2020 showed evidence of small brain bleed. Eliquis therapy was discontinued.  --MRI on 11/04/2020 showed concern for progression of intracranial disease.  --On 12/02/2020, patient underwent craniotomy, resection of progressive cerebellar mass by Dr. Venetia Maxon. 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  --MRI on 12/23/2020 showed evidence of radiation necrosis. Dr. Barbaraann Cao evaluated the patient on 01/02/2021 with recommendations to add IV Bevacizumab 10 mg/kg q 3 weeks ( started 01/26/2021). Completed a full course of this therapy. --Patient was evaluated by Dr. Barbaraann Cao on 08/29/2021 and recommended to resume Bevacizumab due to worsening dizziness.  Which was given from Cycle 25-27.  Plan: --continue to follow with Dr. Barbaraann Cao  --Avastin added back in the patient's chemotherapy regimen. Will restart in 4 weeks from today (out from dental procedure).  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    # Hypertension: --Continue Norvasc to 10 mg daily.  --continue to monitor at home and in clinic.    No orders of the defined types were placed in this encounter.   All  questions were answered. The patient knows to call the clinic with any problems, questions or concerns.  I have spent a total of 30 minutes minutes of face-to-face and non-face-to-face time, preparing to see the patient, performing a medically appropriate examination, counseling and educating the patient,  documenting clinical information in the electronic health record, and care coordination.   Ulysees Barns, MD Department of Hematology/Oncology [facility] Phone: [phone] Pager: [phone] Email: [email or web address]   03/03/2023 4:27 PM   Literature  Support:  Lynn Ito, Rodrguez-Abreu D, Naaman Plummer, Felip E, De Angelis F, Domine M, [place], Hochmair MJ, [place], [place], [address], [place], Grossi F, [place], Reck M, [place], [place], [place], Rubio-Viqueira B, Novello S, Kurata T, Gray JE, Vida J, Wei Z, Yang J, Raftopoulos H, [place], [place] [employer]; [id number] Investigators. Pembrolizumab plus Chemotherapy in Metastatic Non-Small-Cell Lung Cancer. Malva Limes Med. 2018 May 31;378(22):2078-2092.  --Median progression-free survival was 8.8 months (95% CI, 7.6 to 9.2) in the pembrolizumab-combination group and 4.9 months (95% CI, 4.7 to 5.5) in the placebo-combination group (hazard ratio for disease progression or death, 0.52; 95% CI, 0.43 to 0.64; P<0.001). Adverse events of grade 3 or higher occurred in 67.2% of the patients in the pembrolizumab-combination group and in 65.8% of those in the placebo-combination group.  Cristal Generous L. Efficacy of pemetrexed-based regimens in advanced non-small cell lung cancer patients with activating epidermal growth factor receptor mutations after tyrosine kinase inhibitor failure: a systematic review. Onco Targets Ther. 2018;11:2121-2129.   --The weighted median PFS, median OS, and ORR for patients treated with pem regimens were 5.09 months, 15.91 months, and 30.19%, respectively. Our systematic review results showed a favorable efficacy profile of pem regimens in NSCLC patients with EGFR mutation after EGFR-TKI failure.

## 2023-02-21 NOTE — Patient Instructions (Signed)
[facility]  Discharge Instructions: Thank you for choosing [facility] to provide your oncology and hematology care.   If you have a lab appointment with the Cancer Center, please go directly to the Cancer Center and check in at the registration area.   Wear comfortable clothing and clothing appropriate for easy access to any Portacath or PICC line.   We strive to give you quality time with your provider. You may need to reschedule your appointment if you arrive late (15 or more minutes).  Arriving late affects you and other patients whose appointments are after yours.  Also, if you miss three or more appointments without notifying the office, you may be dismissed from the clinic at the provider's discretion.      For prescription refill requests, have your pharmacy contact our office and allow 72 hours for refills to be completed.    Today you received the following chemotherapy and/or immunotherapy agents: Keytruda, Alimta.       To help prevent nausea and vomiting after your treatment, we encourage you to take your nausea medication as directed.  BELOW ARE SYMPTOMS THAT SHOULD BE REPORTED IMMEDIATELY: *FEVER GREATER THAN 100.4 F (38 C) OR HIGHER *CHILLS OR SWEATING *NAUSEA AND VOMITING THAT IS NOT CONTROLLED WITH YOUR NAUSEA MEDICATION *UNUSUAL SHORTNESS OF BREATH *UNUSUAL BRUISING OR BLEEDING *URINARY PROBLEMS (pain or burning when urinating, or frequent urination) *BOWEL PROBLEMS (unusual diarrhea, constipation, pain near the anus) TENDERNESS IN MOUTH AND THROAT WITH OR WITHOUT PRESENCE OF ULCERS (sore throat, sores in mouth, or a toothache) UNUSUAL RASH, SWELLING OR PAIN  UNUSUAL VAGINAL DISCHARGE OR ITCHING   Items with * indicate a potential emergency and should be followed up as soon as possible or go to the Emergency Department if any problems should occur.  Please show the CHEMOTHERAPY ALERT CARD or IMMUNOTHERAPY ALERT CARD  at check-in to the Emergency Department and triage nurse.  Should you have questions after your visit or need to cancel or reschedule your appointment, please contact [facility]  Dept: [phone]  and follow the prompts.  Office hours are 8:00 a.m. to 4:30 p.m. Monday - Friday. Please note that voicemails left after 4:00 p.m. may not be returned until the following business day.  We are closed weekends and major holidays. You have access to a nurse at all times for urgent questions. Please call the main number to the clinic Dept: [phone] and follow the prompts.   For any non-urgent questions, you may also contact your provider using MyChart. We now offer e-Visits for anyone 18 and older to request care online for non-urgent symptoms. For details visit [email or web address].   Also download the MyChart app! Go to the app store, search "MyChart", open the app, select [facility], and log in with your MyChart username and password.   

## 2023-03-03 ENCOUNTER — Encounter: Payer: Self-pay | Admitting: Hematology and Oncology

## 2023-03-04 ENCOUNTER — Other Ambulatory Visit: Payer: Self-pay | Admitting: Hematology and Oncology

## 2023-03-05 ENCOUNTER — Other Ambulatory Visit (HOSPITAL_COMMUNITY): Payer: Self-pay

## 2023-03-05 MED ORDER — XTAMPZA ER 13.5 MG PO C12A
1.0000 | EXTENDED_RELEASE_CAPSULE | Freq: Two times a day (BID) | ORAL | 0 refills | Status: DC
  Filled 2023-03-05: qty 20, 10d supply, fill #0
  Filled 2023-03-06: qty 40, 20d supply, fill #0

## 2023-03-06 ENCOUNTER — Other Ambulatory Visit (HOSPITAL_COMMUNITY): Payer: Self-pay

## 2023-03-07 ENCOUNTER — Other Ambulatory Visit: Payer: Self-pay

## 2023-03-08 ENCOUNTER — Other Ambulatory Visit: Payer: Self-pay | Admitting: Hematology and Oncology

## 2023-03-08 MED ORDER — POTASSIUM CHLORIDE CRYS ER 20 MEQ PO TBCR: 40.0000 meq | EXTENDED_RELEASE_TABLET | Freq: Two times a day (BID) | ORAL | 0 refills | Status: DC

## 2023-03-15 ENCOUNTER — Inpatient Hospital Stay: Payer: Medicare Other

## 2023-03-15 ENCOUNTER — Ambulatory Visit: Payer: Medicare Other

## 2023-03-15 ENCOUNTER — Other Ambulatory Visit: Payer: Medicare Other

## 2023-03-15 ENCOUNTER — Ambulatory Visit: Payer: Medicare Other | Admitting: Hematology and Oncology

## 2023-03-15 ENCOUNTER — Other Ambulatory Visit: Payer: Self-pay

## 2023-03-15 ENCOUNTER — Inpatient Hospital Stay (HOSPITAL_BASED_OUTPATIENT_CLINIC_OR_DEPARTMENT_OTHER): Payer: Medicare Other | Admitting: Physician Assistant

## 2023-03-15 ENCOUNTER — Inpatient Hospital Stay: Payer: Medicare Other | Attending: Hematology and Oncology

## 2023-03-15 VITALS — BP 105/82 | HR 92 | Temp 98.6°F | Resp 18

## 2023-03-15 VITALS — BP 113/76 | HR 91 | Temp 98.0°F | Resp 16 | Ht 62.0 in | Wt 148.9 lb

## 2023-03-15 DIAGNOSIS — Z86718 Personal history of other venous thrombosis and embolism: Secondary | ICD-10-CM | POA: Insufficient documentation

## 2023-03-15 DIAGNOSIS — Z90710 Acquired absence of both cervix and uterus: Secondary | ICD-10-CM | POA: Diagnosis not present

## 2023-03-15 DIAGNOSIS — R112 Nausea with vomiting, unspecified: Secondary | ICD-10-CM | POA: Diagnosis not present

## 2023-03-15 DIAGNOSIS — E876 Hypokalemia: Secondary | ICD-10-CM | POA: Insufficient documentation

## 2023-03-15 DIAGNOSIS — R918 Other nonspecific abnormal finding of lung field: Secondary | ICD-10-CM

## 2023-03-15 DIAGNOSIS — Z79899 Other long term (current) drug therapy: Secondary | ICD-10-CM | POA: Diagnosis not present

## 2023-03-15 DIAGNOSIS — Z5111 Encounter for antineoplastic chemotherapy: Secondary | ICD-10-CM | POA: Diagnosis present

## 2023-03-15 DIAGNOSIS — Z5112 Encounter for antineoplastic immunotherapy: Secondary | ICD-10-CM | POA: Diagnosis present

## 2023-03-15 DIAGNOSIS — Z95828 Presence of other vascular implants and grafts: Secondary | ICD-10-CM

## 2023-03-15 DIAGNOSIS — R0600 Dyspnea, unspecified: Secondary | ICD-10-CM | POA: Insufficient documentation

## 2023-03-15 DIAGNOSIS — C7951 Secondary malignant neoplasm of bone: Secondary | ICD-10-CM

## 2023-03-15 DIAGNOSIS — C7931 Secondary malignant neoplasm of brain: Secondary | ICD-10-CM

## 2023-03-15 DIAGNOSIS — C3431 Malignant neoplasm of lower lobe, right bronchus or lung: Secondary | ICD-10-CM | POA: Diagnosis present

## 2023-03-15 DIAGNOSIS — Z7901 Long term (current) use of anticoagulants: Secondary | ICD-10-CM | POA: Insufficient documentation

## 2023-03-15 DIAGNOSIS — R42 Dizziness and giddiness: Secondary | ICD-10-CM | POA: Insufficient documentation

## 2023-03-15 DIAGNOSIS — Z9049 Acquired absence of other specified parts of digestive tract: Secondary | ICD-10-CM | POA: Insufficient documentation

## 2023-03-15 DIAGNOSIS — I10 Essential (primary) hypertension: Secondary | ICD-10-CM | POA: Diagnosis not present

## 2023-03-15 LAB — CMP (CANCER CENTER ONLY)
ALT: 11 U/L (ref 0–44)
AST: 19 U/L (ref 15–41)
Albumin: 3 g/dL — ABNORMAL LOW (ref 3.5–5.0)
Alkaline Phosphatase: 63 U/L (ref 38–126)
Anion gap: 8 (ref 5–15)
BUN: 11 mg/dL (ref 8–23)
CO2: 31 mmol/L (ref 22–32)
Calcium: 9.2 mg/dL (ref 8.9–10.3)
Chloride: 102 mmol/L (ref 98–111)
Creatinine: 0.78 mg/dL (ref 0.44–1.00)
GFR, Estimated: >60 mL/min (ref >60)
Glucose, Bld: 111 mg/dL — ABNORMAL HIGH (ref 70–99)
Potassium: 3.7 mmol/L (ref 3.5–5.1)
Sodium: 141 mmol/L (ref 135–145)
Total Bilirubin: 0.4 mg/dL (ref 0.3–1.2)
Total Protein: 6.2 g/dL — ABNORMAL LOW (ref 6.5–8.1)

## 2023-03-15 LAB — CBC WITH DIFFERENTIAL (CANCER CENTER ONLY)
Abs Immature Granulocytes: 0.01 10*3/uL (ref 0.00–0.07)
Basophils Absolute: 0.1 10*3/uL (ref 0.0–0.1)
Basophils Relative: 1 %
Eosinophils Absolute: 0.2 10*3/uL (ref 0.0–0.5)
Eosinophils Relative: 3 %
HCT: 38.4 % (ref 36.0–46.0)
Hemoglobin: 13.1 g/dL (ref 12.0–15.0)
Immature Granulocytes: 0 %
Lymphocytes Relative: 43 %
Lymphs Abs: 2.3 10*3/uL (ref 0.7–4.0)
MCH: 34.7 pg — ABNORMAL HIGH (ref 26.0–34.0)
MCHC: 34.1 g/dL (ref 30.0–36.0)
MCV: 101.6 fL — ABNORMAL HIGH (ref 80.0–100.0)
Monocytes Absolute: 0.7 10*3/uL (ref 0.1–1.0)
Monocytes Relative: 12 %
Neutro Abs: 2.3 10*3/uL (ref 1.7–7.7)
Neutrophils Relative %: 41 %
Platelet Count: 325 10*3/uL (ref 150–400)
RBC: 3.78 MIL/uL — ABNORMAL LOW (ref 3.87–5.11)
RDW: 13.5 % (ref 11.5–15.5)
WBC Count: 5.5 10*3/uL (ref 4.0–10.5)
nRBC: 0 % (ref 0.0–0.2)

## 2023-03-15 LAB — TSH: TSH: 2.444 u[IU]/mL (ref 0.350–4.500)

## 2023-03-15 MED ORDER — CYANOCOBALAMIN 1000 MCG/ML IJ SOLN
1000.0000 ug | Freq: Once | INTRAMUSCULAR | Status: AC
  Administered 2023-03-15: 1000 ug via INTRAMUSCULAR
  Filled 2023-03-15: qty 1

## 2023-03-15 MED ORDER — SODIUM CHLORIDE 0.9 % IV SOLN: Freq: Once | INTRAVENOUS | Status: AC

## 2023-03-15 MED ORDER — SODIUM CHLORIDE 0.9 % IV SOLN
500.0000 mg/m2 | Freq: Once | INTRAVENOUS | Status: AC
  Administered 2023-03-15: 900 mg via INTRAVENOUS
  Filled 2023-03-15: qty 20

## 2023-03-15 MED ORDER — PROCHLORPERAZINE MALEATE 10 MG PO TABS
10.0000 mg | ORAL_TABLET | Freq: Once | ORAL | Status: AC
  Administered 2023-03-15: 10 mg via ORAL
  Filled 2023-03-15: qty 1

## 2023-03-15 MED ORDER — SODIUM CHLORIDE 0.9 % IV SOLN
200.0000 mg | Freq: Once | INTRAVENOUS | Status: AC
  Administered 2023-03-15: 200 mg via INTRAVENOUS
  Filled 2023-03-15: qty 8

## 2023-03-15 MED ORDER — ZOLEDRONIC ACID 4 MG/100ML IV SOLN
4.0000 mg | INTRAVENOUS | Status: DC
  Administered 2023-03-15: 4 mg via INTRAVENOUS
  Filled 2023-03-15: qty 100

## 2023-03-15 NOTE — Progress Notes (Signed)
Pt. denies any dental issues or concerns. Pt. spouse Harvie Heck states last dental procedure over 3 months ago. Per Georga Kaufmann, PA ok for Zometa today.

## 2023-03-15 NOTE — Patient Instructions (Addendum)
[facility]  Discharge Instructions: Thank you for choosing [facility] to provide your oncology and hematology care.   If you have a lab appointment with the Cancer Center, please go directly to the Cancer Center and check in at the registration area.   Wear comfortable clothing and clothing appropriate for easy access to any Portacath or PICC line.   We strive to give you quality time with your provider. You may need to reschedule your appointment if you arrive late (15 or more minutes).  Arriving late affects you and other patients whose appointments are after yours.  Also, if you miss three or more appointments without notifying the office, you may be dismissed from the clinic at the provider's discretion.      For prescription refill requests, have your pharmacy contact our office and allow 72 hours for refills to be completed.    Today you received the following chemotherapy and/or immunotherapy agents: Pembrolizumab (Keytruda), and Pemetrexed (Alimta).   To help prevent nausea and vomiting after your treatment, we encourage you to take your nausea medication as directed.  BELOW ARE SYMPTOMS THAT SHOULD BE REPORTED IMMEDIATELY: *FEVER GREATER THAN 100.4 F (38 C) OR HIGHER *CHILLS OR SWEATING *NAUSEA AND VOMITING THAT IS NOT CONTROLLED WITH YOUR NAUSEA MEDICATION *UNUSUAL SHORTNESS OF BREATH *UNUSUAL BRUISING OR BLEEDING *URINARY PROBLEMS (pain or burning when urinating, or frequent urination) *BOWEL PROBLEMS (unusual diarrhea, constipation, pain near the anus) TENDERNESS IN MOUTH AND THROAT WITH OR WITHOUT PRESENCE OF ULCERS (sore throat, sores in mouth, or a toothache) UNUSUAL RASH, SWELLING OR PAIN  UNUSUAL VAGINAL DISCHARGE OR ITCHING   Items with * indicate a potential emergency and should be followed up as soon as possible or go to the Emergency Department if any problems should occur.  Please show the CHEMOTHERAPY ALERT  CARD or IMMUNOTHERAPY ALERT CARD at check-in to the Emergency Department and triage nurse.  Should you have questions after your visit or need to cancel or reschedule your appointment, please contact [facility]  Dept: [phone]  and follow the prompts.  Office hours are 8:00 a.m. to 4:30 p.m. Monday - Friday. Please note that voicemails left after 4:00 p.m. may not be returned until the following business day.  We are closed weekends and major holidays. You have access to a nurse at all times for urgent questions. Please call the main number to the clinic Dept: [phone] and follow the prompts.   For any non-urgent questions, you may also contact your provider using MyChart. We now offer e-Visits for anyone 64 and older to request care online for non-urgent symptoms. For details visit [email or web address].   Also download the MyChart app! Go to the app store, search "MyChart", open the app, select [facility], and log in with your MyChart username and password.  Zoledronic Acid Injection (Cancer) What is this medication? ZOLEDRONIC ACID (ZOE le dron ik AS id) treats high calcium levels in the blood caused by cancer. It may also be used with chemotherapy to treat weakened bones caused by cancer. It works by slowing down the release of calcium from bones. This lowers calcium levels in your blood. It also makes your bones stronger and less likely to break (fracture). It belongs to a group of medications called bisphosphonates. This medicine may be used for other purposes; ask your health care provider or pharmacist if you have questions. COMMON BRAND NAME(S): Zometa, Zometa Powder What  should I tell my care team before I take this medication? They need to know if you have any of these conditions: Dehydration Dental disease Kidney disease Liver disease Low levels of calcium in the blood Lung or breathing disease, such as asthma Receiving steroids, such  as dexamethasone or prednisone An unusual or allergic reaction to zoledronic acid, other medications, foods, dyes, or preservatives Pregnant or trying to get pregnant Breast-feeding How should I use this medication? This medication is injected into a vein. It is given by your care team in a hospital or clinic setting. Talk to your care team about the use of this medication in children. Special care may be needed. Overdosage: If you think you have taken too much of this medicine contact a poison control center or emergency room at once. NOTE: This medicine is only for you. Do not share this medicine with others. What if I miss a dose? Keep appointments for follow-up doses. It is important not to miss your dose. Call your care team if you are unable to keep an appointment. What may interact with this medication? Certain antibiotics given by injection Diuretics, such as bumetanide, furosemide NSAIDs, medications for pain and inflammation, such as ibuprofen or naproxen Teriparatide Thalidomide This list may not describe all possible interactions. Give your health care provider a list of all the medicines, herbs, non-prescription drugs, or dietary supplements you use. Also tell them if you smoke, drink alcohol, or use illegal drugs. Some items may interact with your medicine. What should I watch for while using this medication? Visit your care team for regular checks on your progress. It may be some time before you see the benefit from this medication. Some people who take this medication have severe bone, joint, or muscle pain. This medication may also increase your risk for jaw problems or a broken thigh bone. Tell your care team right away if you have severe pain in your jaw, bones, joints, or muscles. Tell you care team if you have any pain that does not go away or that gets worse. Tell your dentist and dental surgeon that you are taking this medication. You should not have major dental surgery  while on this medication. See your dentist to have a dental exam and fix any dental problems before starting this medication. Take good care of your teeth while on this medication. Make sure you see your dentist for regular follow-up appointments. You should make sure you get enough calcium and vitamin D while you are taking this medication. Discuss the foods you eat and the vitamins you take with your care team. Check with your care team if you have severe diarrhea, nausea, and vomiting, or if you sweat a lot. The loss of too much body fluid may make it dangerous for you to take this medication. You may need bloodwork while taking this medication. Talk to your care team if you wish to become pregnant or think you might be pregnant. This medication can cause serious birth defects. What side effects may I notice from receiving this medication? Side effects that you should report to your care team as soon as possible: Allergic reactions--skin rash, itching, hives, swelling of the face, lips, tongue, or throat Kidney injury--decrease in the amount of urine, swelling of the ankles, hands, or feet Low calcium level--muscle pain or cramps, confusion, tingling, or numbness in the hands or feet Osteonecrosis of the jaw--pain, swelling, or redness in the mouth, numbness of the jaw, poor healing after dental work, unusual  discharge from the mouth, visible bones in the mouth Severe bone, joint, or muscle pain Side effects that usually do not require medical attention (report to your care team if they continue or are bothersome): Constipation Fatigue Fever Loss of appetite Nausea Stomach pain This list may not describe all possible side effects. Call your doctor for medical advice about side effects. You may report side effects to FDA at 1-800-FDA-1088. Where should I keep my medication? This medication is given in a hospital or clinic. It will not be stored at home. NOTE: This sheet is a summary. It may  not cover all possible information. If you have questions about this medicine, talk to your doctor, pharmacist, or health care provider.  2024 [employer]/Gold Standard (2021-10-20 00:00:00)

## 2023-03-15 NOTE — Progress Notes (Signed)
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 CT of the head showed showed concerning for metastatic disease involving the left cerebellum and right occipital lobe.  2) 09/30/2019: MRI brain confirms mass within the left cerebellum measures 1.6 x 1.3 x 1.5 cm as well as at least 8 metastatic lesions within the supratentorial and infratentorial brain as outlined 3) 10/01/2019: PET CT scan revealed hypermetabolic infrahilar right lower lobe mass with hypermetabolic nodal metastases in the right hilum, mediastinum, right supraclavicular region, left axilla and lower left neck. 4) 10/08/2019: US guided biopsy of the lymph nodes confirms poorly differentiated non-small cell carcinoma. Immunohistochemistry confirms adenocarcinoma of lung primary. PD-L1 and NGS later revealed a MET Exon 14 mutation and TPS of 90%.  5) 10/12/2019: Establish care with Dr. [name]  6) 11/04/2019: Day 1 of capmatinib 400mg  BID 7) 11/09/2019: presented to Rad/Onc with a DVT in LLE. Seen in symptom management clinic and started on apixaban.  8) 12/29/2019: CT C/A/P showed interval decrease in size of mass involving the superior segment of right lower lobe and reduction in mediastinal and left supraclavicular adenopathy though some small spinal lesions were noted in the interim between the two sets of scans 9) 01/25/2020: temporarily stopped capmatinib due to symptoms of nausea/fatigue. Restarted therapy on 01/30/2020 after brief chemo holiday.  10)  03/22/2020: CT C/A/P reveals a mixed picture, with new lung nodule and increased lymphadenopathy, however response in bone lesions. D/c capmatinib therapy 11) 04/08/2020: start of Carbo/Pem/Pem for 2nd line treatment. Cycle 1 Day 1   12) 04/29/2020: Cycle 2 Day 1 Carbo/Pem/Pem  13) 05/20/2020: Cycle 3 Day 1 Carbo/Pem/Pem  14) 06/09/2020: Cycle 4 Day 1 Carbo/Pem/Pem  15) 06/29/2020: Cycle 5 Day 1 maintenance Pem/Pem  16) 07/20/2020: Cycle 6 Day 1 maintenance Pem/Pem  17) 08/12/2020: Cycle 7 Day 1 maintenance Pem/Pem  18) 09/01/2020: Cycle 8 Day 1 maintenance Pem/Pem  19) 09/21/2020: Cycle 9 Day 1 maintenance Pem/Pem  20) 10/13/2020: Cycle 10 Day 1 maintenance Pem/Pem  21) 11/03/2020: Cycle 11 Day 1 maintenance Pem/Pem  22) 11/25/2020:  Cycle 12 Day 1 maintenance Pem/Pem 23) 12/15/2020:  Cycle 13 Day 1 maintenance Pem/Pem 24) 01/05/2021:  Cycle 14 Day 1 maintenance Pem/Pem 25) 01/26/2021:  Cycle 15 Day 1 maintenance Pem/Pem plus Bevacizumab.  02/17/2021: Cycle 16 Day 1 maintenance Pem/Pem plus Bevacizumab.  03/10/2021: Cycle 17 Day 1 maintenance Pem/Pem plus Bevacizumab.  03/31/2021: Cycle 18 Day 1 maintenance Pem/Pem  04/20/2021:  Cycle 19 Day 1 maintenance Pem/Pem  05/12/2021: Cycle 20 Day 1 maintenance Pem/Pem 06/01/2021: Cycle 21 Day 1 maintenance Pem/Pem 06/22/2021: Cycle 22 Day 1 maintenance Pem/Pem 07/13/2021: Cycle 23 Day 1 maintenance Pem/Pem 08/16/2021: Cycle 24 Day 1 maintenance Pem/Pem 09/08/2021: Cycle 25 Day 1 maintenance Pem/Pem plus Bevacizumab 09/28/2021: Cycle 26 Day 1 maintenance Pem/Pem plus Bevacizumab 10/20/2021: Cycle 27 Day 1 maintenance Pem/Pem plus Bevacizumab 11/09/2021: Cycle 28 Day 1 maintenance Pem/Pem  12/06/2021: Cycle 29 Day 1 maintenance Pem/Pem  12/27/2021: Cycle 30 Day 1 maintenance Pem/Pem  01/18/2022: Cycle 31 Day 1 maintenance Pem/Pem  02/07/2022: Cycle 32 Day  1 maintenance Pem/Pem  02/28/2022: Cycle 33 Day 1 maintenance Pem/Pem  03/23/2022: Cycle 34 Day 1 maintenance Pem/Pem   04/13/2022: Cycle 35 Day 1 maintenance Pem/Pem  05/04/2022: Cycle 36 Day 1 maintenance Pem/Pem  05/25/2022: Cycle 37 Day 1 maintenance Pem/Pem  06/14/2022: Cycle 38 Day 1 maintenance Pem/Pem 07/05/2022: Cycle 39 Day 1 maintenance Pem/Pem 08/13/2022: Cycle 40 Day 1 maintenance Pem/Pem 09/06/2022:Cycle 41 Day 1 maintenance Pem/Pem 09/27/2022: Cycle 42 Day 1 maintenance Pem/Pem 10/19/2022: Cycle 43 Day 1 maintenance Pem/Pem 11/09/2022: Cycle 44 Day 1 maintenance Pem/Pem 11/30/2022: Cycle 45 Day 1 maintenance Pem/Pem 12/20/2022: Cycle 46 Day 1 maintenance Pem/Pem 01/10/2023: Cycle 47 Day 1 maintenance Pem/Pem 01/31/2023:Cycle 48 Day 1 maintenance Pem/Pem 02/22/2023: Cycle 49 Day 1 maintenance Pem/Pem 03/15/2023: Cycle 50 Day 1 maintenance Pem/Pem  Interval History:  Virginia [name] 67 y.o. female with medical history significant for metastatic adenocarcinoma of the lung presents for a follow up visit. The patient's last visit was on 02/21/2023.  In the interim since the last visit she has continued on maintenance Pem/Pem.   On exam today Virginia [name] reports that she continues to have dizziness and nausea which has worsened some since the last visit. She denies any falls associated with the dizziness. She is taking zofran 4 mg q 8 hours as prescribed which is improving her symptoms. She reports her appetite and energy levels are stable. She denies bowel habit changes including recurrent episodes of diarrhea or constipation. She denies easy bruising or signs of bleeding. She reports more wheezing recently and a cough which she thinks is secondary to seasonal allergies. She uses an inhaler which improves her wheezing. She denies any fevers, chills, sweats or chest pain. She has no other complaints.  Full 10 point ROS was otherwise negative  MEDICAL HISTORY:  Past Medical History:  Diagnosis Date   Anemia    Anxiety    Concussion 09/28/2019   DVT (deep venous thrombosis) (HCC) 2021   L leg   Dyspnea    GERD  (gastroesophageal reflux disease)    Hypercholesterolemia    per pt, she does not have elevated lipids   Hypertension    met lung ca dx'd 09/2019   mets to spine, hip and brain   PONV (postoperative nausea and vomiting)    Tobacco abuse     SURGICAL HISTORY: Past Surgical History:  Procedure Laterality Date   ABDOMINAL HYSTERECTOMY     partial/ left ovaries   APPLICATION OF CRANIAL NAVIGATION N/A 12/02/2020   Procedure: APPLICATION OF CRANIAL NAVIGATION;  Surgeon: Maeola Harman, MD;  Location: [facility] OR;  Service: Neurosurgery;  Laterality: N/A;   CHOLECYSTECTOMY     CRANIOTOMY N/A 12/02/2020   Procedure: Posterior fossa craniotomy for tumor resection with brainlab;  Surgeon: Maeola Harman, MD;  Location[facility] OR;  Service: Neurosurgery;  Laterality: N/A;   DILATION AND CURETTAGE OF UTERUS     IR IMAGING GUIDED PORT INSERTION  10/23/2019   IR PATIENT EVAL TECH 0-60 MINS  11/21/2021   IR PATIENT EVAL TECH 0-60 MINS  11/24/2021   IR PATIENT EVAL TECH 0-60 MINS  11/29/2021   IR PATIENT EVAL TECH 0-60 MINS  12/04/2021   IR PATIENT EVAL TECH 0-60 MINS  12/12/2021   IR PATIENT EVAL TECH 0-60 MINS  12/19/2021   IR PATIENT EVAL TECH 0-60 MINS  12/27/2021   IR PATIENT EVAL TECH 0-60 MINS  01/03/2022   IR PATIENT EVAL TECH 0-60 MINS  01/10/2022   IR PATIENT EVAL TECH 0-60  MINS  01/18/2022   IR PATIENT EVAL TECH 0-60 MINS  02/13/2022   IR PATIENT EVAL TECH 0-60 MINS  02/20/2022   IR RADIOLOGIST EVAL & MGMT  01/23/2022   IR REMOVAL TUN ACCESS W/ PORT W/O FL MOD SED  11/17/2021   KYPHOPLASTY N/A 03/15/2020   Procedure: Thoracic Eight KYPHOPLASTY;  Surgeon: Maeola Harman, MD;  Location: [facility] OR;  Service: Neurosurgery;  Laterality: N/A;  prone    LIPOMA EXCISION  2018   removed under left breast and right thigh.   TUBAL LIGATION      ALLERGIES:  has No Known Allergies.  MEDICATIONS:  Current Outpatient Medications  Medication Sig Dispense Refill   acetaminophen (TYLENOL) 500 MG tablet Take 500 mg by mouth 2  (two) times daily as needed for moderate pain.     albuterol (VENTOLIN HFA) 108 (90 Base) MCG/ACT inhaler INHALE 2 PUFFS INTO THE LUNGS EVERY 6 HOURS AS NEEDED FOR WHEEZING OR SHORTNESS OF BREATH 6.7 g 11   ALPRAZolam (XANAX) 0.25 MG tablet Take 1 tablet (0.25 mg total) by mouth 2 (two) times daily as needed for anxiety 60 tablet 0   amLODipine (NORVASC) 10 MG tablet Take 1 tablet (10 mg total) by mouth daily. 90 tablet 3   Cyanocobalamin (VITAMIN B12 PO) Take 1 tablet by mouth daily. Gummies     EPINEPHrine 0.3 mg/0.3 mL IJ SOAJ injection Inject 0.3 mg into the muscle as needed for anaphylaxis. 2 each 0   folic acid (FOLVITE) 1 MG tablet TAKE 1 TABLET BY MOUTH DAILY 90 tablet 1   furosemide (LASIX) 20 MG tablet Take 2 tablets (40 mg total) by mouth 2 times daily. 360 tablet 0   gabapentin (NEURONTIN) 300 MG capsule Take 1 capsule (300 mg total) by mouth at bedtime. 30 capsule 0   Glucosamine HCl (GLUCOSAMINE PO) Take 2 tablets by mouth daily.     LYSINE PO Take 1 tablet by mouth daily.     meclizine (ANTIVERT) 25 MG tablet Take 1 tablet (25 mg total) by mouth 3 (three) times daily as needed for dizziness. 30 tablet 0   Multiple Vitamin (MULTIVITAMIN WITH MINERALS) TABS tablet Take 1 tablet by mouth daily.     Multiple Vitamins-Minerals (AIRBORNE PO) Take 1 tablet by mouth daily.      OLANZapine (ZYPREXA) 2.5 MG tablet TAKE 1 TABLET BY MOUTH AT BEDTIME 90 tablet 1   ondansetron (ZOFRAN) 4 MG tablet TAKE 1 TABLET(4 MG) BY MOUTH EVERY 8 HOURS AS NEEDED FOR NAUSEA OR VOMITING 40 tablet 2   OVER THE COUNTER MEDICATION Take 2 tablets by mouth daily. Beet root     oxyCODONE (OXY IR/ROXICODONE) 5 MG immediate release tablet TAKE 1 TO 2 TABLETS BY MOUTH EVERY 4 HOURS AS NEEDED FOR SEVERE PAIN 90 tablet 0   oxyCODONE (OXY IR/ROXICODONE) 5 MG immediate release tablet TAKE 1 TO 2 TABLETS BY MOUTH EVERY 4 HOURS AS NEEDED FOR SEVERE PAIN 90 tablet 0   pantoprazole (PROTONIX) 40 MG tablet Take 1 tablet (40 mg  total) by mouth daily. 90 tablet 3   polyethylene glycol (MIRALAX / GLYCOLAX) 17 g packet Take 17 g by mouth every evening.     potassium chloride SA (KLOR-CON M) 20 MEQ tablet Take 2 tablets (40 mEq total) by mouth 2 (two) times daily. 360 tablet 0   Tetrahydrozoline HCl (VISINE OP) Place 1 drop into both eyes daily as needed (irritation).     VITAMIN D PO Take 1 capsule by mouth  daily.     vitamin E 180 MG (400 UNITS) capsule Take 400 IU daily x 1 week, then 400 IU BID (Patient taking differently: Take 400 Units by mouth daily.) 60 capsule 4   XTAMPZA ER 13.5 MG C12A Take 1 capsule by mouth in the morning and at bedtime. (every 12 hours) 60 capsule 0   No current facility-administered medications for this visit.   Facility-Administered Medications Ordered in Other Visits  Medication Dose Route Frequency Provider Last Rate Last Admin   Zoledronic Acid (ZOMETA) IVPB 4 mg  4 mg Intravenous Q90 days Ulysees Barns IV, MD 400 mL/hr at 03/15/23 1239 4 mg at 03/15/23 1239    REVIEW OF SYSTEMS:   Constitutional: ( - ) fevers, ( - )  chills , ( - ) night sweats Eyes: ( - ) blurriness of vision, ( - ) double vision, ( - ) watery eyes Ears, nose, mouth, throat, and face: ( - ) mucositis, ( - ) sore throat Respiratory: ( - ) cough, ( + ) dyspnea, ( - ) wheezes Cardiovascular: ( - ) palpitation, ( - ) chest discomfort, ( - ) lower extremity swelling Gastrointestinal:  ( + ) nausea, ( - ) heartburn, ( - ) change in bowel habits Skin: ( - ) abnormal skin rashes Lymphatics: ( - ) new lymphadenopathy, ( - ) easy bruising Neurological: ( - ) numbness, ( - ) tingling, ( - ) new weaknesses Behavioral/Psych: ( - ) mood change, ( - ) new changes  All other systems were reviewed with the patient and are negative.  PHYSICAL EXAMINATION: ECOG PERFORMANCE STATUS: 1 - Symptomatic but completely ambulatory  Vitals:   03/15/23 1118  BP: 113/76  Pulse: 91  Resp: 16  Temp: 98 F (36.7 C)  SpO2: 95%       Filed Weights   03/15/23 1118  Weight: 148 lb 14.4 oz (67.5 kg)    GENERAL: well appearing middle aged Caucasian female in NAD  SKIN: skin color, texture, turgor are normal, no rashes or significant lesions EYES: conjunctiva are pink and non-injected, sclera clear LUNGS: wheezing at lung bases. clear to auscultation and percussion with normal breathing effort HEART: regular rate & rhythm and no murmurs and bilateral trace pitting lower extremity edema Musculoskeletal: no cyanosis of digits and no clubbing PSYCH: alert & oriented x 3, fluent speech NEURO: no focal motor/sensory deficits  LABORATORY DATA:  I have reviewed the data as listed    Latest Ref Rng & Units 03/15/2023   10:32 AM 02/21/2023   10:49 AM 01/31/2023   10:48 AM  CBC  WBC 4.0 - 10.5 K/uL 5.5  5.6  5.8   Hemoglobin 12.0 - 15.0 g/dL 16.1  09.6  04.5   Hematocrit 36.0 - 46.0 % 38.4  38.9  37.5   Platelets 150 - 400 K/uL 325  391  365        Latest Ref Rng & Units 03/15/2023   10:32 AM 02/21/2023   10:49 AM 01/31/2023   10:48 AM  CMP  Glucose 70 - 99 mg/dL 409  811  914   BUN 8 - 23 mg/dL 11  16  11    Creatinine 0.44 - 1.00 mg/dL 7.82  9.56  2.13   Sodium 135 - 145 mmol/L 141  140  141   Potassium 3.5 - 5.1 mmol/L 3.7  3.4  3.2   Chloride 98 - 111 mmol/L 102  103  101   CO2 22 - 32  mmol/L 31  30  34   Calcium 8.9 - 10.3 mg/dL 9.2  9.0  8.6   Total Protein 6.5 - 8.1 g/dL 6.2  6.9  6.8   Total Bilirubin 0.3 - 1.2 mg/dL 0.4  0.3  0.3   Alkaline Phos 38 - 126 U/L 63  54  56   AST 15 - 41 U/L 19  18  18    ALT 0 - 44 U/L 11  11  10       RADIOGRAPHIC STUDIES: I personally have viewed the radiographic studies below: CT CHEST ABDOMEN PELVIS W CONTRAST  Result Date: 02/20/2023 CLINICAL DATA:  Metastatic non-small cell lung cancer restaging * Tracking Code: BO * EXAM: CT CHEST, ABDOMEN, AND PELVIS WITH CONTRAST TECHNIQUE: Multidetector CT imaging of the chest, abdomen and pelvis was performed following the  standard protocol during bolus administration of intravenous contrast. RADIATION DOSE REDUCTION: This exam was performed according to the departmental dose-optimization program which includes automated exposure control, adjustment of the mA and/or kV according to patient size and/or use of iterative reconstruction technique. CONTRAST:  80mL OMNIPAQUE IOHEXOL 300 MG/ML  SOLN COMPARISON:  10/10/2022 FINDINGS: CT CHEST FINDINGS Cardiovascular: Aortic atherosclerosis. Normal heart size. Left coronary artery calcifications. Unchanged small pericardial effusion. Mediastinum/Nodes: Unchanged matted soft tissue throughout the mediastinum without discretely enlarged lymph nodes (series 2, image 25). Thyroid gland, trachea, and esophagus demonstrate no significant findings. Lungs/Pleura: Unchanged post treatment scarring of the infrahilar right lung (series 4, image 84) as well as scarring or atelectasis of the right lung base (series 4, image 70). Numerous small bilateral pulmonary nodules and scattered ground-glass nodules throughout the lungs, with a background of innumerable very tiny centrilobular nodules. No pleural effusion or pneumothorax. Musculoskeletal: No chest wall abnormality. No acute osseous findings. Unchanged wedge deformity of T8 with vertebral cement augmentation (series 6, image 81). Unchanged, scattered, ill-defined heterogeneous sclerosis throughout the remaining vertebral bodies. CT ABDOMEN PELVIS FINDINGS Hepatobiliary: No focal liver abnormality is seen. Status post cholecystectomy. Unchanged mild postoperative biliary ductal dilatation. Pancreas: Unremarkable. No pancreatic ductal dilatation or surrounding inflammatory changes. Spleen: Normal in size without significant abnormality. Adrenals/Urinary Tract: Adrenal glands are unremarkable. Kidneys are normal, without renal calculi, solid lesion, or hydronephrosis. Bladder is unremarkable. Stomach/Bowel: Stomach is within normal limits. Appendix  appears normal. No evidence of bowel wall thickening, distention, or inflammatory changes. Descending and sigmoid diverticulosis. Vascular/Lymphatic: Aortic atherosclerosis. No enlarged abdominal or pelvic lymph nodes. Reproductive: Status post hysterectomy. Other: No abdominal wall hernia or abnormality. No ascites. Musculoskeletal: No acute osseous findings. IMPRESSION: 1. Unchanged post treatment scarring of the infrahilar right lung as well as scarring or atelectasis of the right lung base. 2. Numerous small bilateral pulmonary nodules and scattered ground-glass nodules throughout the lungs, with a background of innumerable very tiny centrilobular nodules. Nodules are generally nonspecific and may reflect smoking-related respiratory bronchiolitis and or sequelae of other prior infection or inflammation. Attention on follow-up. 3. Unchanged matted soft tissue throughout the mediastinum without discretely enlarged lymph nodes consistent with treated lymphadenopathy. 4. Unchanged wedge deformity of T8 status post vertebral cement augmentation. Unchanged, scattered, ill-defined heterogeneous sclerosis throughout the remaining vertebral bodies. 5. No evidence of soft tissue metastatic disease in the abdomen or pelvis. 6. Unchanged small pericardial effusion. 7. Coronary artery disease. Aortic Atherosclerosis (ICD10-I70.0). Electronically Signed   By: Jearld Lesch M.D.   On: 02/20/2023 22:58     ASSESSMENT & PLAN Virginia [name] 67 y.o. female with medical history significant for metastatic adenocarcinoma of  the lung presents for a follow up visit. 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   Previously we discussed Carboplatin/Pembrolizumab/Pemetrexed and the expected side effect from these medications.  We discussed the possible side effects of immunotherapy including colitis, hepatitis, dermatitis, and pneumonitis.  Additionally we discussed the side effects of  carboplatin which include suppression of blood counts, nephrotoxicity, and nausea.  Additionally we discussed pemetrexed which can also have effects on counts and would require supplementation with vitamin B12 and folic acid.  She voiced her understanding of these side effects and treatment plans moving forward.  I also noted that we would be able to drop the chemotherapy agents that she had intolerance to continue with pembrolizumab alone given her high TPS score.  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 Due to worsening dizziness, she resumed this from Cycle 25-27.  --Most recent CT CAP from 02/16/2023 showed stable disease.  PLAN: --Due for Cycle 50, Day 1 of maintenance Pem/Pem today --Labs from today were reviewed and adequate for treatment.  Labs today show white blood cell count 5.5, hemoglobin 13.1, MCV 101.6, and platelets of 325. Creatinine and LFTs normal. --Proceed with treatment today without any dose modifications.  --RTC q 3 weeks for labs, follow up visit prior to Cycle 50.    #Nausea/Vomiting, worsening --Regimen includes Zofran 4-8mg  q8h PRN and compazine 10mg  q6H for breakthrough PRN, Olanzpaine 2.5 mg PO QHS to help with nausea.  She is currently using Phenergan for breakthrough. --continue to monitor   #Hypokalemia, stable --likely 2/2 to decreased PO intake, hypovolemia, and BP medications --Potassium level 3.7 today.   --Currently on potassium chloride tablets, 40 mEq PO potassium in AM and in PM.   #Pain Control, stable --continue oxycodone 5mg  q4H PRN. Can increase dose to 10mg  q6H if pain is severe. She is taking approximately 2-3 pills per day.  --continue Xtampza 13.5 mg ER BID for long acting support.  --continue to take with Senokot to prevent  opioid induced constipation. 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 Next dose due today.    #Brain Metastasis, stable --appreciate the assistance of Dr. Barbaraann Cao in treating her brain metastasis and symptoms. --radiation therapy to the brain completed on 11/16/2019.  --MRI brain on 04/29/2020 showed evidence of small brain bleed. Eliquis therapy was discontinued.  --MRI on 11/04/2020 showed concern for progression of intracranial disease.  --On 12/02/2020, patient underwent craniotomy, resection of progressive cerebellar mass by Dr. Venetia Maxon. 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  --MRI on 12/23/2020 showed evidence of radiation necrosis. Dr. Barbaraann Cao evaluated the patient on 01/02/2021 with recommendations to add IV Bevacizumab 10 mg/kg q 3 weeks ( started 01/26/2021). Completed a full course of this therapy. --Patient was evaluated by Dr. Barbaraann Cao on 08/29/2021 and recommended to resume Bevacizumab due to worsening dizziness.  Which was given from Cycle 25-27.  Plan: --continue to follow with Dr. Barbaraann Cao  --brain MRI performed on 10/11/2022, no evidence of progressive disease. Next brain MRI due on 04/01/2023. --last dose of Bevacizumab on 07/19/2022.   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    # Hypertension: --Continue Norvasc to 10 mg daily.  --continue to monitor at home and in clinic.    Orders Placed This Encounter  Procedures   TSH    Standing Status:   Future    Standing Expiration Date:   06/06/2024   CBC with Differential (Cancer Center Only)    Standing Status:   Future    Standing Expiration Date:   06/06/2024   CMP (Cancer Center only)    Standing Status:   Future    Standing Expiration Date:   06/06/2024   TSH    Standing Status:   Future     Standing Expiration Date:   06/27/2024   CBC with Differential (Cancer Center Only)    Standing Status:   Future    Standing Expiration Date:   06/27/2024   CMP (Cancer Center only)    Standing Status:   Future    Standing Expiration Date:   06/27/2024    All questions were answered. The patient knows to call the clinic with any problems, questions or concerns.  I have spent a total of 30 minutes minutes of face-to-face and non-face-to-face time, preparing to see the patient, performing a medically appropriate examination, counseling and educating the patient,  documenting clinical information in the electronic health record, and care coordination.    Georga Kaufmann [name] Dept of Hematology and Oncology [facility] at [facility] Phone: [phone]    03/15/2023 2:08 PM   Literature Support:  Lynn Ito, Rodrguez-Abreu D, Rennie Plowman E, Felip E, [place] F, Domine M, [place], Hochmair MJ, [place], [place], [address], [place], Grossi F, [place], Reck M, [place], [place], [place], Rubio-Viqueira B, Novello S, Kurata T, Gray JE, Vida J, Wei Z, Yang J, Raftopoulos H, [place], [place] [employer]; [id number] Investigators. Pembrolizumab plus Chemotherapy in Metastatic Non-Small-Cell Lung Cancer. Malva Limes Med. 2018 May 31;378(22):2078-2092.  --Median progression-free survival was 8.8 months (95% CI, 7.6 to 9.2) in the pembrolizumab-combination group and 4.9 months (95% CI, 4.7 to 5.5) in the placebo-combination group (hazard ratio for disease progression or death, 0.52; 95% CI, 0.43 to 0.64; P<0.001). Adverse events of grade 3 or higher occurred in 67.2% of the patients in the pembrolizumab-combination group and in 65.8% of those in the placebo-combination group.  Cristal Generous L. Efficacy of pemetrexed-based regimens in advanced non-small cell lung cancer patients with activating epidermal growth factor receptor mutations after tyrosine  kinase inhibitor failure: a systematic review. Onco Targets Ther. 2018;11:2121-2129.   --The weighted median PFS, median OS, and ORR for patients treated with pem regimens were 5.09 months, 15.91 months, and 30.19%, respectively. Our systematic review results showed a favorable efficacy profile of pem regimens in NSCLC patients with EGFR mutation after EGFR-TKI failure.

## 2023-03-20 ENCOUNTER — Other Ambulatory Visit: Payer: Self-pay | Admitting: Hematology and Oncology

## 2023-04-01 ENCOUNTER — Ambulatory Visit
Admission: RE | Admit: 2023-04-01 | Discharge: 2023-04-01 | Disposition: A | Discharge status: Home / Self Care | Payer: Medicare Other | Source: Ambulatory Visit | Attending: Internal Medicine | Admitting: Internal Medicine

## 2023-04-01 MED ORDER — GADOPICLENOL 0.5 MMOL/ML IV SOLN
6.0000 mL | Freq: Once | INTRAVENOUS | Status: AC | PRN
  Administered 2023-04-01: 6 mL via INTRAVENOUS

## 2023-04-03 ENCOUNTER — Telehealth: Payer: Self-pay | Admitting: *Deleted

## 2023-04-03 NOTE — Telephone Encounter (Signed)
Received call from pt's husband, Harvie Heck. He is requesting refills of Xtampza and Oxycodone. Xtampza last filled on 03/05/23 for #60; Oxycodone  last filled on 01/30/23 for #90. Please refill both @ [facility] on [address]. It is less expensive there.

## 2023-04-04 ENCOUNTER — Inpatient Hospital Stay: Payer: Medicare Other

## 2023-04-04 ENCOUNTER — Inpatient Hospital Stay (HOSPITAL_BASED_OUTPATIENT_CLINIC_OR_DEPARTMENT_OTHER): Payer: Medicare Other | Admitting: Hematology and Oncology

## 2023-04-04 ENCOUNTER — Inpatient Hospital Stay (HOSPITAL_BASED_OUTPATIENT_CLINIC_OR_DEPARTMENT_OTHER): Payer: Medicare Other | Admitting: Internal Medicine

## 2023-04-04 ENCOUNTER — Other Ambulatory Visit: Payer: Self-pay | Admitting: Hematology and Oncology

## 2023-04-04 ENCOUNTER — Other Ambulatory Visit: Payer: Self-pay

## 2023-04-04 VITALS — BP 118/81 | HR 94 | Resp 16

## 2023-04-04 DIAGNOSIS — C7931 Secondary malignant neoplasm of brain: Secondary | ICD-10-CM

## 2023-04-04 DIAGNOSIS — C3431 Malignant neoplasm of lower lobe, right bronchus or lung: Secondary | ICD-10-CM | POA: Diagnosis not present

## 2023-04-04 DIAGNOSIS — R918 Other nonspecific abnormal finding of lung field: Secondary | ICD-10-CM | POA: Diagnosis not present

## 2023-04-04 LAB — CBC WITH DIFFERENTIAL (CANCER CENTER ONLY)
Abs Immature Granulocytes: 0.03 10*3/uL (ref 0.00–0.07)
Basophils Absolute: 0.1 10*3/uL (ref 0.0–0.1)
Basophils Relative: 1 %
Eosinophils Absolute: 0.2 10*3/uL (ref 0.0–0.5)
Eosinophils Relative: 3 %
HCT: 37.8 % (ref 36.0–46.0)
Hemoglobin: 12.9 g/dL (ref 12.0–15.0)
Immature Granulocytes: 1 %
Lymphocytes Relative: 42 %
Lymphs Abs: 2.7 10*3/uL (ref 0.7–4.0)
MCH: 34.4 pg — ABNORMAL HIGH (ref 26.0–34.0)
MCHC: 34.1 g/dL (ref 30.0–36.0)
MCV: 100.8 fL — ABNORMAL HIGH (ref 80.0–100.0)
Monocytes Absolute: 0.9 10*3/uL (ref 0.1–1.0)
Monocytes Relative: 14 %
Neutro Abs: 2.3 10*3/uL (ref 1.7–7.7)
Neutrophils Relative %: 39 %
Platelet Count: 484 10*3/uL — ABNORMAL HIGH (ref 150–400)
RBC: 3.75 MIL/uL — ABNORMAL LOW (ref 3.87–5.11)
RDW: 14 % (ref 11.5–15.5)
WBC Count: 6.1 10*3/uL (ref 4.0–10.5)
nRBC: 0 % (ref 0.0–0.2)

## 2023-04-04 LAB — CMP (CANCER CENTER ONLY)
ALT: 9 U/L (ref 0–44)
AST: 19 U/L (ref 15–41)
Albumin: 3.1 g/dL — ABNORMAL LOW (ref 3.5–5.0)
Alkaline Phosphatase: 64 U/L (ref 38–126)
Anion gap: 7 (ref 5–15)
BUN: 14 mg/dL (ref 8–23)
CO2: 31 mmol/L (ref 22–32)
Calcium: 9.1 mg/dL (ref 8.9–10.3)
Chloride: 103 mmol/L (ref 98–111)
Creatinine: 0.86 mg/dL (ref 0.44–1.00)
GFR, Estimated: >60 mL/min (ref >60)
Glucose, Bld: 105 mg/dL — ABNORMAL HIGH (ref 70–99)
Potassium: 3.6 mmol/L (ref 3.5–5.1)
Sodium: 141 mmol/L (ref 135–145)
Total Bilirubin: 0.3 mg/dL (ref 0.3–1.2)
Total Protein: 6.7 g/dL (ref 6.5–8.1)

## 2023-04-04 LAB — TSH: TSH: 4.882 u[IU]/mL — ABNORMAL HIGH (ref 0.350–4.500)

## 2023-04-04 MED ORDER — SODIUM CHLORIDE 0.9 % IV SOLN
200.0000 mg | Freq: Once | INTRAVENOUS | Status: AC
  Administered 2023-04-04: 200 mg via INTRAVENOUS
  Filled 2023-04-04: qty 200

## 2023-04-04 MED ORDER — SODIUM CHLORIDE 0.9 % IV SOLN: Freq: Once | INTRAVENOUS | Status: AC

## 2023-04-04 MED ORDER — OXYCODONE HCL 5 MG PO TABS: 5.0000 mg | ORAL_TABLET | ORAL | 0 refills | Status: DC | PRN

## 2023-04-04 MED ORDER — PROCHLORPERAZINE MALEATE 10 MG PO TABS
10.0000 mg | ORAL_TABLET | Freq: Once | ORAL | Status: AC
  Administered 2023-04-04: 10 mg via ORAL
  Filled 2023-04-04: qty 1

## 2023-04-04 MED ORDER — SODIUM CHLORIDE 0.9 % IV SOLN
500.0000 mg/m2 | Freq: Once | INTRAVENOUS | Status: AC
  Administered 2023-04-04: 900 mg via INTRAVENOUS
  Filled 2023-04-04: qty 20

## 2023-04-04 MED ORDER — XTAMPZA ER 13.5 MG PO C12A: 1.0000 | EXTENDED_RELEASE_CAPSULE | Freq: Two times a day (BID) | ORAL | 0 refills | Status: DC

## 2023-04-04 NOTE — Patient Instructions (Signed)
[facility]  Discharge Instructions: Thank you for choosing [facility] to provide your oncology and hematology care.   If you have a lab appointment with the Cancer Center, please go directly to the Cancer Center and check in at the registration area.   Wear comfortable clothing and clothing appropriate for easy access to any Portacath or PICC line.   We strive to give you quality time with your provider. You may need to reschedule your appointment if you arrive late (15 or more minutes).  Arriving late affects you and other patients whose appointments are after yours.  Also, if you miss three or more appointments without notifying the office, you may be dismissed from the clinic at the provider's discretion.      For prescription refill requests, have your pharmacy contact our office and allow 72 hours for refills to be completed.    Today you received the following chemotherapy and/or immunotherapy agents: Pembrolizumab (Keytruda), and Pemetrexed (Alimta).   To help prevent nausea and vomiting after your treatment, we encourage you to take your nausea medication as directed.  BELOW ARE SYMPTOMS THAT SHOULD BE REPORTED IMMEDIATELY: *FEVER GREATER THAN 100.4 F (38 C) OR HIGHER *CHILLS OR SWEATING *NAUSEA AND VOMITING THAT IS NOT CONTROLLED WITH YOUR NAUSEA MEDICATION *UNUSUAL SHORTNESS OF BREATH *UNUSUAL BRUISING OR BLEEDING *URINARY PROBLEMS (pain or burning when urinating, or frequent urination) *BOWEL PROBLEMS (unusual diarrhea, constipation, pain near the anus) TENDERNESS IN MOUTH AND THROAT WITH OR WITHOUT PRESENCE OF ULCERS (sore throat, sores in mouth, or a toothache) UNUSUAL RASH, SWELLING OR PAIN  UNUSUAL VAGINAL DISCHARGE OR ITCHING   Items with * indicate a potential emergency and should be followed up as soon as possible or go to the Emergency Department if any problems should occur.  Please show the CHEMOTHERAPY ALERT  CARD or IMMUNOTHERAPY ALERT CARD at check-in to the Emergency Department and triage nurse.  Should you have questions after your visit or need to cancel or reschedule your appointment, please contact [facility]  Dept: [phone]  and follow the prompts.  Office hours are 8:00 a.m. to 4:30 p.m. Monday - Friday. Please note that voicemails left after 4:00 p.m. may not be returned until the following business day.  We are closed weekends and major holidays. You have access to a nurse at all times for urgent questions. Please call the main number to the clinic Dept: [phone] and follow the prompts.   For any non-urgent questions, you may also contact your provider using MyChart. We now offer e-Visits for anyone 64 and older to request care online for non-urgent symptoms. For details visit [email or web address].   Also download the MyChart app! Go to the app store, search "MyChart", open the app, select [facility], and log in with your MyChart username and password.  Zoledronic Acid Injection (Cancer) What is this medication? ZOLEDRONIC ACID (ZOE le dron ik AS id) treats high calcium levels in the blood caused by cancer. It may also be used with chemotherapy to treat weakened bones caused by cancer. It works by slowing down the release of calcium from bones. This lowers calcium levels in your blood. It also makes your bones stronger and less likely to break (fracture). It belongs to a group of medications called bisphosphonates. This medicine may be used for other purposes; ask your health care provider or pharmacist if you have questions. COMMON BRAND NAME(S): Zometa, Zometa Powder What  should I tell my care team before I take this medication? They need to know if you have any of these conditions: Dehydration Dental disease Kidney disease Liver disease Low levels of calcium in the blood Lung or breathing disease, such as asthma Receiving steroids, such  as dexamethasone or prednisone An unusual or allergic reaction to zoledronic acid, other medications, foods, dyes, or preservatives Pregnant or trying to get pregnant Breast-feeding How should I use this medication? This medication is injected into a vein. It is given by your care team in a hospital or clinic setting. Talk to your care team about the use of this medication in children. Special care may be needed. Overdosage: If you think you have taken too much of this medicine contact a poison control center or emergency room at once. NOTE: This medicine is only for you. Do not share this medicine with others. What if I miss a dose? Keep appointments for follow-up doses. It is important not to miss your dose. Call your care team if you are unable to keep an appointment. What may interact with this medication? Certain antibiotics given by injection Diuretics, such as bumetanide, furosemide NSAIDs, medications for pain and inflammation, such as ibuprofen or naproxen Teriparatide Thalidomide This list may not describe all possible interactions. Give your health care provider a list of all the medicines, herbs, non-prescription drugs, or dietary supplements you use. Also tell them if you smoke, drink alcohol, or use illegal drugs. Some items may interact with your medicine. What should I watch for while using this medication? Visit your care team for regular checks on your progress. It may be some time before you see the benefit from this medication. Some people who take this medication have severe bone, joint, or muscle pain. This medication may also increase your risk for jaw problems or a broken thigh bone. Tell your care team right away if you have severe pain in your jaw, bones, joints, or muscles. Tell you care team if you have any pain that does not go away or that gets worse. Tell your dentist and dental surgeon that you are taking this medication. You should not have major dental surgery  while on this medication. See your dentist to have a dental exam and fix any dental problems before starting this medication. Take good care of your teeth while on this medication. Make sure you see your dentist for regular follow-up appointments. You should make sure you get enough calcium and vitamin D while you are taking this medication. Discuss the foods you eat and the vitamins you take with your care team. Check with your care team if you have severe diarrhea, nausea, and vomiting, or if you sweat a lot. The loss of too much body fluid may make it dangerous for you to take this medication. You may need bloodwork while taking this medication. Talk to your care team if you wish to become pregnant or think you might be pregnant. This medication can cause serious birth defects. What side effects may I notice from receiving this medication? Side effects that you should report to your care team as soon as possible: Allergic reactions--skin rash, itching, hives, swelling of the face, lips, tongue, or throat Kidney injury--decrease in the amount of urine, swelling of the ankles, hands, or feet Low calcium level--muscle pain or cramps, confusion, tingling, or numbness in the hands or feet Osteonecrosis of the jaw--pain, swelling, or redness in the mouth, numbness of the jaw, poor healing after dental work, unusual  discharge from the mouth, visible bones in the mouth Severe bone, joint, or muscle pain Side effects that usually do not require medical attention (report to your care team if they continue or are bothersome): Constipation Fatigue Fever Loss of appetite Nausea Stomach pain This list may not describe all possible side effects. Call your doctor for medical advice about side effects. You may report side effects to FDA at 1-800-FDA-1088. Where should I keep my medication? This medication is given in a hospital or clinic. It will not be stored at home. NOTE: This sheet is a summary. It may  not cover all possible information. If you have questions about this medicine, talk to your doctor, pharmacist, or health care provider.  2024 Elsevier/Gold Standard (2021-10-20 00:00:00)

## 2023-04-04 NOTE — Progress Notes (Signed)
[facility] [address]  [place], [place] [address] [phone]   Interval Evaluation  Date of Service: 04/04/23 Patient Name: Virginia [name] Patient MRN: [id number] Patient DOB: 12-20-1955 Provider: Henreitta Leber, MD  Identifying Statement:  Virginia [name] is a 67 y.o. 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 Basilio Cairo 12/02/20: Craniotomy, resection of progressive cerebellar mass by Dr. Venetia Maxon.  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  She continues to experience high overall burden of dizziness, vertigo which limits her ability to walk and perform activities of daily living.  This is essentially unchanged in recent months.  Sleep burden remains high, 16 hours.  Continues on Pem/pem with Dr. [name].  No seizures or headaches.  H+P (10/15/19) Patient presents to clinic to discuss recent neurologic symptoms and imaging findings.  She describes a fall while climbing stairs on 1/20; she hit the back of her and lost consciousness.  She is unclear whether she lost her balance or had mechanical provocation, but she had been drinking that evening.  She was treated initially as a trauma, CT demonstrated a lesion in the posterior fossa, later confirmed as a metastasis by MRI, along with 7 additional smaller lesions.  It took her almost two weeks to return for cognition and balance to return to "normal" which she feels she is about at right now.  Baseline she is high functioning and independent, works as a Haematologist.  Has been taking decadron 2mg  twice per day.  Plans to initiate chemotherapy soon with Dr.  [name].  Medications: Current Outpatient Medications on File Prior to Visit  Medication Sig Dispense Refill   acetaminophen (TYLENOL) 500 MG tablet Take 500 mg by mouth 2 (two) times daily as needed for moderate pain.     albuterol (VENTOLIN HFA) 108 (90 Base) MCG/ACT inhaler INHALE 2 PUFFS INTO THE LUNGS EVERY 6 HOURS AS NEEDED FOR WHEEZING OR SHORTNESS OF BREATH 6.7 g 11   ALPRAZolam (XANAX) 0.25 MG tablet TAKE 1 TABLET(0.25 MG) BY MOUTH TWICE DAILY AS NEEDED FOR ANXIETY 60 tablet 0   amLODipine (NORVASC) 10 MG tablet Take 1 tablet (10 mg total) by mouth daily. 90 tablet 3   Cyanocobalamin (VITAMIN B12 PO) Take 1 tablet by mouth daily. Gummies     EPINEPHrine 0.3 mg/0.3 mL IJ SOAJ injection Inject 0.3 mg into the muscle as needed for anaphylaxis. 2 each 0   folic acid (FOLVITE) 1 MG tablet TAKE 1 TABLET BY MOUTH DAILY 90 tablet 1   furosemide (LASIX) 20 MG tablet Take 2 tablets (40 mg total) by mouth 2 times daily. 360 tablet 0   gabapentin (NEURONTIN) 300 MG capsule Take 1 capsule (300 mg total) by mouth at bedtime. 30 capsule 0   Glucosamine HCl (GLUCOSAMINE PO) Take 2 tablets by mouth daily.     LYSINE PO Take 1 tablet by mouth daily.     meclizine (ANTIVERT) 25 MG tablet Take 1 tablet (25 mg total) by mouth 3 (three) times daily as needed for dizziness. 30 tablet 0   Multiple Vitamin (MULTIVITAMIN WITH MINERALS) TABS tablet Take 1 tablet by mouth daily.     Multiple Vitamins-Minerals (AIRBORNE PO)  Take 1 tablet by mouth daily.      OLANZapine (ZYPREXA) 2.5 MG tablet TAKE 1 TABLET BY MOUTH AT BEDTIME 90 tablet 1   ondansetron (ZOFRAN) 4 MG tablet TAKE 1 TABLET(4 MG) BY MOUTH EVERY 8 HOURS AS NEEDED FOR NAUSEA OR VOMITING 40 tablet 2   OVER THE COUNTER MEDICATION Take 2 tablets by mouth daily. Beet root     oxyCODONE (OXY IR/ROXICODONE) 5 MG immediate release tablet TAKE 1 TO 2 TABLETS BY MOUTH EVERY 4 HOURS AS NEEDED FOR SEVERE PAIN 90 tablet 0   oxyCODONE (OXY IR/ROXICODONE) 5 MG  immediate release tablet TAKE 1 TO 2 TABLETS BY MOUTH EVERY 4 HOURS AS NEEDED FOR SEVERE PAIN 90 tablet 0   pantoprazole (PROTONIX) 40 MG tablet Take 1 tablet (40 mg total) by mouth daily. 90 tablet 3   polyethylene glycol (MIRALAX / GLYCOLAX) 17 g packet Take 17 g by mouth every evening.     potassium chloride SA (KLOR-CON M) 20 MEQ tablet Take 2 tablets (40 mEq total) by mouth 2 (two) times daily. 360 tablet 0   Tetrahydrozoline HCl (VISINE OP) Place 1 drop into both eyes daily as needed (irritation).     VITAMIN D PO Take 1 capsule by mouth daily.     vitamin E 180 MG (400 UNITS) capsule Take 400 IU daily x 1 week, then 400 IU BID (Patient taking differently: Take 400 Units by mouth daily.) 60 capsule 4   XTAMPZA ER 13.5 MG C12A Take 1 capsule by mouth in the morning and at bedtime. (every 12 hours) 60 capsule 0   No current facility-administered medications on file prior to visit.    Allergies: No Known Allergies Past Medical History:  Past Medical History:  Diagnosis Date   Anemia    Anxiety    Concussion 09/28/2019   DVT (deep venous thrombosis) (HCC) 2021   L leg   Dyspnea    GERD (gastroesophageal reflux disease)    Hypercholesterolemia    per pt, she does not have elevated lipids   Hypertension    met lung ca dx'd 09/2019   mets to spine, hip and brain   PONV (postoperative nausea and vomiting)    Tobacco abuse    Past Surgical History:  Past Surgical History:  Procedure Laterality Date   ABDOMINAL HYSTERECTOMY     partial/ left ovaries   APPLICATION OF CRANIAL NAVIGATION N/A 12/02/2020   Procedure: APPLICATION OF CRANIAL NAVIGATION;  Surgeon: Maeola Harman, MD;  Location: [facility] OR;  Service: Neurosurgery;  Laterality: N/A;   CHOLECYSTECTOMY     CRANIOTOMY N/A 12/02/2020   Procedure: Posterior fossa craniotomy for tumor resection with brainlab;  Surgeon: Maeola Harman, MD;  Location: [facility] OR;  Service: Neurosurgery;  Laterality: N/A;   DILATION AND CURETTAGE OF UTERUS      IR IMAGING GUIDED PORT INSERTION  10/23/2019   IR PATIENT EVAL TECH 0-60 MINS  11/21/2021   IR PATIENT EVAL TECH 0-60 MINS  11/24/2021   IR PATIENT EVAL TECH 0-60 MINS  11/29/2021   IR PATIENT EVAL TECH 0-60 MINS  12/04/2021   IR PATIENT EVAL TECH 0-60 MINS  12/12/2021   IR PATIENT EVAL TECH 0-60 MINS  12/19/2021   IR PATIENT EVAL TECH 0-60 MINS  12/27/2021   IR PATIENT EVAL TECH 0-60 MINS  01/03/2022   IR PATIENT EVAL TECH 0-60 MINS  01/10/2022   IR PATIENT EVAL TECH 0-60 MINS  01/18/2022   IR PATIENT EVAL TECH 0-60 MINS  02/13/2022   IR PATIENT EVAL TECH 0-60 MINS  02/20/2022   IR RADIOLOGIST EVAL & MGMT  01/23/2022   IR REMOVAL TUN ACCESS W/ PORT W/O FL MOD SED  11/17/2021   KYPHOPLASTY N/A 03/15/2020   Procedure: Thoracic Eight KYPHOPLASTY;  Surgeon: Maeola Harman, MD;  Location: [facility] OR;  Service: Neurosurgery;  Laterality: N/A;  prone    LIPOMA EXCISION  2018   removed under left breast and right thigh.   TUBAL LIGATION     Social History:  Social History   Socioeconomic History   Marital status: Married    Spouse name: Not on file   Number of children: 3   Years of education: Not on file   Highest education level: Not on file  Occupational History   Occupation: owns Editor, commissioning [employer]    Employer: [employer]  Tobacco Use   Smoking status: Former    Current packs/day: 0.00    Types: Cigarettes    Quit date: 10/31/2012    Years since quitting: 10.4   Smokeless tobacco: Never  Vaping Use   Vaping status: Never Used  Substance and Sexual Activity   Alcohol use: Yes    Alcohol/week: 0.0 standard drinks of alcohol    Comment: rare   Drug use: No   Sexual activity: Yes  Other Topics Concern   Not on file  Social History Narrative   Not on file   Social Determinants of Health   Financial Resource Strain: Not on file  Food Insecurity: Not on file  Transportation Needs: Not on file  Physical Activity: Not on file  Stress: Not on file  Social Connections: Not on file  Intimate  Partner Violence: Not on file   Family History:  Family History  Problem Relation Age of Onset   Heart disease Father    Drug abuse Daughter    Drug abuse Son    Cancer Sister    Heart disease Brother    Heart attack Brother     Review of Systems: Constitutional: Doesn't report fevers, chills or abnormal weight loss Eyes: Doesn't report blurriness of vision Ears, nose, mouth, throat, and face: Doesn't report sore throat Respiratory: Doesn't report cough, dyspnea or wheezes Cardiovascular: Doesn't report palpitation, chest discomfort  Gastrointestinal:  Doesn't report nausea, constipation, diarrhea GU: Doesn't report incontinence Skin: Doesn't report skin rashes Neurological: Per HPI Musculoskeletal: Doesn't report joint pain Behavioral/Psych: Doesn't report anxiety  Physical Exam: Wt Readings from Last 3 Encounters:  04/04/23 148 lb (67.1 kg)  03/15/23 148 lb 14.4 oz (67.5 kg)  02/21/23 145 lb 14.4 oz (66.2 kg)   Temp Readings from Last 3 Encounters:  04/04/23 98.3 F (36.8 C) (Oral)  03/15/23 98.6 F (37 C) (Oral)  03/15/23 98 F (36.7 C) (Oral)   BP Readings from Last 3 Encounters:  04/04/23 118/81  04/04/23 119/75  03/15/23 105/82   Pulse Readings from Last 3 Encounters:  04/04/23 94  04/04/23 98  03/15/23 92    KPS: 60. General: Wheelchair bound  Head: Normal EENT: No conjunctival injection or scleral icterus.  Lungs: Resp effort normal Cardiac: Regular rate Abdomen: Non-distended abdomen Skin: No rashes cyanosis or petechiae. Extremities: No clubbing or edema  Neurologic Exam: Mental Status: Awake, alert, attentive to examiner. Oriented to self and environment. Language is fluent with intact comprehension.  Cranial Nerves: Visual acuity is grossly normal. Noted dysarthria. Visual fields are full. Extra-ocular movements intact. No ptosis. Face is symmetric Motor: Tone and bulk are normal. Power is  full in both arms and legs. Reflexes are symmetric,  no pathologic reflexes present.  Dense dysmetria, coordination impaired bilaterally. Sensory: Intact to light touch Gait: Non ambulatory today  Labs: I have reviewed the data as listed    Component Value Date/Time   NA 141 04/04/2023 0923   K 3.6 04/04/2023 0923   CL 103 04/04/2023 0923   CO2 31 04/04/2023 0923   GLUCOSE 105 (H) 04/04/2023 0923   BUN 14 04/04/2023 0923   CREATININE 0.86 04/04/2023 0923   CREATININE 0.58 11/15/2017 0948   CALCIUM 9.1 04/04/2023 0923   PROT 6.7 04/04/2023 0923   ALBUMIN 3.1 (L) 04/04/2023 0923   AST 19 04/04/2023 0923   ALT 9 04/04/2023 0923   ALKPHOS 64 04/04/2023 0923   BILITOT 0.3 04/04/2023 0923   GFRNONAA >60 04/04/2023 0923   GFRNONAA 100 11/15/2017 0948   GFRAA >60 06/09/2020 1051   GFRAA 116 11/15/2017 0948   Lab Results  Component Value Date   WBC 6.1 04/04/2023   NEUTROABS 2.3 04/04/2023   HGB 12.9 04/04/2023   HCT 37.8 04/04/2023   MCV 100.8 (H) 04/04/2023   PLT 484 (H) 04/04/2023    Imaging:  [facility] Clinician Interpretation: I have personally reviewed the CNS images as listed.  My interpretation, in the context of the patient's clinical presentation, is likely treatment effect, avastin washout.  Pending official read  No results found.   Assessment/Plan Malignant neoplasm metastatic to brain [facility]) - Plan: MR BRAIN W WO CONTRAST   Ms. 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  MRI brain demonstrates significant increase in volume of enhancement within posterior fossa.  When compared to August 2023, this appears stable, and thus may be consistent with avastin washout and ongoing radionecrosis.  Last avastin infusion was in November.    We recommended closer follow up with repeat MRI brain in ~2 months.  If further progression is seen, alternate plans and interventions will be discussed.  Will con't to follow with [facility] for systemic therapy.    For  vertigo, can con't meclizine dose 50mg  for PRN TID if helpful.    We appreciate the opportunity to participate in the care of Virginia [name].    We ask that Virginia [name] return to clinic in 2 months following next brain MRI, or sooner as needed.  All questions were answered. The patient knows to call the clinic with any problems, questions or concerns. No barriers to learning were detected.  The total time spent in the encounter was 40 minutes and more than 50% was on counseling and review of test results   Henreitta Leber, MD Medical Director of Neuro-Oncology [facility] 04/04/23 1:56 PM

## 2023-04-04 NOTE — Progress Notes (Signed)
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 CT of the head showed showed concerning for metastatic disease involving the left cerebellum and right occipital lobe.  2) 09/30/2019: MRI brain confirms mass within the left cerebellum measures 1.6 x 1.3 x 1.5 cm as well as at least 8 metastatic lesions within the supratentorial and infratentorial brain as outlined 3) 10/01/2019: PET CT scan revealed hypermetabolic infrahilar right lower lobe mass with hypermetabolic nodal metastases in the right hilum, mediastinum, right supraclavicular region, left axilla and lower left neck. 4) 10/08/2019: US guided biopsy of the lymph nodes confirms poorly differentiated non-small cell carcinoma. Immunohistochemistry confirms adenocarcinoma of lung primary. PD-L1 and NGS later revealed a MET Exon 14 mutation and TPS of 90%.  5) 10/12/2019: Establish care with Dr. [name]  6) 11/04/2019: Day 1 of capmatinib 400mg  BID 7) 11/09/2019: presented to Rad/Onc with a DVT in LLE. Seen in symptom management clinic and started on apixaban.  8) 12/29/2019: CT C/A/P showed interval decrease in size of mass involving the superior segment of right lower lobe and reduction in mediastinal and left supraclavicular adenopathy though some small spinal lesions were noted in the interim between the two sets of scans 9) 01/25/2020: temporarily stopped capmatinib due to symptoms of nausea/fatigue. Restarted therapy on 01/30/2020 after brief chemo holiday.  10)  03/22/2020: CT C/A/P reveals a mixed picture, with new lung nodule and increased lymphadenopathy, however response in bone lesions. D/c capmatinib therapy 11) 04/08/2020: start of Carbo/Pem/Pem for 2nd line treatment. Cycle 1 Day 1   12) 04/29/2020: Cycle 2 Day 1 Carbo/Pem/Pem  13) 05/20/2020: Cycle 3 Day 1 Carbo/Pem/Pem  14) 06/09/2020: Cycle 4 Day 1 Carbo/Pem/Pem  15) 06/29/2020: Cycle 5 Day 1 maintenance Pem/Pem  16) 07/20/2020: Cycle 6 Day 1 maintenance Pem/Pem  17) 08/12/2020: Cycle 7 Day 1 maintenance Pem/Pem  18) 09/01/2020: Cycle 8 Day 1 maintenance Pem/Pem  19) 09/21/2020: Cycle 9 Day 1 maintenance Pem/Pem  20) 10/13/2020: Cycle 10 Day 1 maintenance Pem/Pem  21) 11/03/2020: Cycle 11 Day 1 maintenance Pem/Pem  22) 11/25/2020:  Cycle 12 Day 1 maintenance Pem/Pem 23) 12/15/2020:  Cycle 13 Day 1 maintenance Pem/Pem 24) 01/05/2021:  Cycle 14 Day 1 maintenance Pem/Pem 25) 01/26/2021:  Cycle 15 Day 1 maintenance Pem/Pem plus Bevacizumab.  02/17/2021: Cycle 16 Day 1 maintenance Pem/Pem plus Bevacizumab.  03/10/2021: Cycle 17 Day 1 maintenance Pem/Pem plus Bevacizumab.  03/31/2021: Cycle 18 Day 1 maintenance Pem/Pem  04/20/2021:  Cycle 19 Day 1 maintenance Pem/Pem  05/12/2021: Cycle 20 Day 1 maintenance Pem/Pem 06/01/2021: Cycle 21 Day 1 maintenance Pem/Pem 06/22/2021: Cycle 22 Day 1 maintenance Pem/Pem 07/13/2021: Cycle 23 Day 1 maintenance Pem/Pem 08/16/2021: Cycle 24 Day 1 maintenance Pem/Pem 09/08/2021: Cycle 25 Day 1 maintenance Pem/Pem plus Bevacizumab 09/28/2021: Cycle 26 Day 1 maintenance Pem/Pem plus Bevacizumab 10/20/2021: Cycle 27 Day 1 maintenance Pem/Pem plus Bevacizumab 11/09/2021: Cycle 28 Day 1 maintenance Pem/Pem  12/06/2021: Cycle 29 Day 1 maintenance Pem/Pem  12/27/2021: Cycle 30 Day 1 maintenance Pem/Pem  01/18/2022: Cycle 31 Day 1 maintenance Pem/Pem  02/07/2022: Cycle 32 Day  1 maintenance Pem/Pem  02/28/2022: Cycle 33 Day 1 maintenance Pem/Pem  03/23/2022: Cycle 34 Day 1 maintenance Pem/Pem   04/13/2022: Cycle 35 Day 1 maintenance Pem/Pem  05/04/2022: Cycle 36 Day 1 maintenance Pem/Pem  05/25/2022: Cycle 37 Day 1 maintenance Pem/Pem  06/14/2022: Cycle 38 Day 1 maintenance Pem/Pem 07/05/2022: Cycle 39 Day 1 maintenance Pem/Pem 08/13/2022: Cycle 40 Day 1 maintenance Pem/Pem 09/06/2022:Cycle 41 Day 1 maintenance Pem/Pem 09/27/2022: Cycle 42 Day 1 maintenance Pem/Pem 10/19/2022: Cycle 43 Day 1 maintenance Pem/Pem 11/09/2022: Cycle 44 Day 1 maintenance Pem/Pem 11/30/2022: Cycle 45 Day 1 maintenance Pem/Pem 12/20/2022: Cycle 46 Day 1 maintenance Pem/Pem 01/10/2023: Cycle 47 Day 1 maintenance Pem/Pem 01/31/2023:Cycle 48 Day 1 maintenance Pem/Pem 02/22/2023: Cycle 49 Day 1 maintenance Pem/Pem 03/15/2023: Cycle 50 Day 1 maintenance Pem/Pem 04/04/2023:  Cycle 51 Day 1 maintenance Pem/Pem  Interval History:  Virginia [name] 67 y.o. female with medical history significant for metastatic adenocarcinoma of the lung presents for a follow up visit. The patient's last visit was on 03/15/2023.  In the interim since the last visit she has continued on maintenance Pem/Pem.   On exam today Virginia [name] reports he has been well overall in the interim since her last visit.  Unfortunately she has developed worsening and dizziness and is "hoping for good news from Dr. Barbaraann Cao today".  Her energy levels are so-so and she does feel like she is sleepy.  She notes that her appetite is good and her weight is stable at 148 pounds.  She continues taking her Xtampza twice daily as well as oxycodone about 2 or 3/day.  She notes that she has an upcoming trip and November to [place] but she is unsure if she would like to go through with it.  The deadline to decide is October 1.  Overall she is willing and able to proceed with treatment at this time.  She denies any fevers, chills, sweats, vomiting, diarrhea.  Full 10 point ROS was otherwise negative  MEDICAL HISTORY:  Past Medical History:  Diagnosis Date   Anemia    Anxiety     Concussion 09/28/2019   DVT (deep venous thrombosis) (HCC) 2021   L leg   Dyspnea    GERD (gastroesophageal reflux disease)    Hypercholesterolemia    per pt, she does not have elevated lipids   Hypertension    met lung ca dx'd 09/2019   mets to spine, hip and brain   PONV (postoperative nausea and vomiting)    Tobacco abuse     SURGICAL HISTORY: Past Surgical History:  Procedure Laterality Date   ABDOMINAL HYSTERECTOMY     partial/ left ovaries   APPLICATION OF CRANIAL NAVIGATION N/A 12/02/2020   Procedure: APPLICATION OF CRANIAL NAVIGATION;  Surgeon: Maeola Harman, MD;  Location: [facility] OR;  Service: Neurosurgery;  Laterality: N/A;   CHOLECYSTECTOMY     CRANIOTOMY N/A 12/02/2020   Procedure: Posterior fossa craniotomy for tumor resection with brainlab;  Surgeon: Maeola Harman, MD;  Location: [facility] OR;  Service: Neurosurgery;  Laterality: N/A;   DILATION AND CURETTAGE OF UTERUS     IR IMAGING GUIDED PORT INSERTION  10/23/2019   IR PATIENT EVAL TECH 0-60 MINS  11/21/2021   IR PATIENT EVAL TECH 0-60 MINS  11/24/2021   IR PATIENT EVAL TECH 0-60 MINS  11/29/2021   IR PATIENT EVAL TECH 0-60 MINS  12/04/2021   IR PATIENT EVAL TECH 0-60 MINS  12/12/2021   IR PATIENT EVAL TECH 0-60 MINS  12/19/2021   IR  PATIENT EVAL TECH 0-60 MINS  12/27/2021   IR PATIENT EVAL TECH 0-60 MINS  01/03/2022   IR PATIENT EVAL TECH 0-60 MINS  01/10/2022   IR PATIENT EVAL TECH 0-60 MINS  01/18/2022   IR PATIENT EVAL TECH 0-60 MINS  02/13/2022   IR PATIENT EVAL TECH 0-60 MINS  02/20/2022   IR RADIOLOGIST EVAL & MGMT  01/23/2022   IR REMOVAL TUN ACCESS W/ PORT W/O FL MOD SED  11/17/2021   KYPHOPLASTY N/A 03/15/2020   Procedure: Thoracic Eight KYPHOPLASTY;  Surgeon: Maeola Harman, MD;  Location: [facility] OR;  Service: Neurosurgery;  Laterality: N/A;  prone    LIPOMA EXCISION  2018   removed under left breast and right thigh.   TUBAL LIGATION      ALLERGIES:  has No Known Allergies.  MEDICATIONS:  Current Outpatient Medications   Medication Sig Dispense Refill   acetaminophen (TYLENOL) 500 MG tablet Take 500 mg by mouth 2 (two) times daily as needed for moderate pain.     albuterol (VENTOLIN HFA) 108 (90 Base) MCG/ACT inhaler INHALE 2 PUFFS INTO THE LUNGS EVERY 6 HOURS AS NEEDED FOR WHEEZING OR SHORTNESS OF BREATH 6.7 g 11   ALPRAZolam (XANAX) 0.25 MG tablet TAKE 1 TABLET(0.25 MG) BY MOUTH TWICE DAILY AS NEEDED FOR ANXIETY 60 tablet 0   amLODipine (NORVASC) 10 MG tablet Take 1 tablet (10 mg total) by mouth daily. 90 tablet 3   Cyanocobalamin (VITAMIN B12 PO) Take 1 tablet by mouth daily. Gummies     EPINEPHrine 0.3 mg/0.3 mL IJ SOAJ injection Inject 0.3 mg into the muscle as needed for anaphylaxis. 2 each 0   folic acid (FOLVITE) 1 MG tablet TAKE 1 TABLET BY MOUTH DAILY 90 tablet 1   furosemide (LASIX) 20 MG tablet Take 2 tablets (40 mg total) by mouth 2 times daily. 360 tablet 0   gabapentin (NEURONTIN) 300 MG capsule Take 1 capsule (300 mg total) by mouth at bedtime. 30 capsule 0   Glucosamine HCl (GLUCOSAMINE PO) Take 2 tablets by mouth daily.     LYSINE PO Take 1 tablet by mouth daily.     meclizine (ANTIVERT) 25 MG tablet Take 1 tablet (25 mg total) by mouth 3 (three) times daily as needed for dizziness. 30 tablet 0   Multiple Vitamin (MULTIVITAMIN WITH MINERALS) TABS tablet Take 1 tablet by mouth daily.     Multiple Vitamins-Minerals (AIRBORNE PO) Take 1 tablet by mouth daily.      OLANZapine (ZYPREXA) 2.5 MG tablet TAKE 1 TABLET BY MOUTH AT BEDTIME 90 tablet 1   ondansetron (ZOFRAN) 4 MG tablet TAKE 1 TABLET(4 MG) BY MOUTH EVERY 8 HOURS AS NEEDED FOR NAUSEA OR VOMITING 40 tablet 2   OVER THE COUNTER MEDICATION Take 2 tablets by mouth daily. Beet root     oxyCODONE (OXY IR/ROXICODONE) 5 MG immediate release tablet TAKE 1 TO 2 TABLETS BY MOUTH EVERY 4 HOURS AS NEEDED FOR SEVERE PAIN 90 tablet 0   oxyCODONE (OXY IR/ROXICODONE) 5 MG immediate release tablet Take 1 tablet (5 mg total) by mouth every 4 (four) hours as  needed for severe pain. 90 tablet 0   pantoprazole (PROTONIX) 40 MG tablet Take 1 tablet (40 mg total) by mouth daily. 90 tablet 3   polyethylene glycol (MIRALAX / GLYCOLAX) 17 g packet Take 17 g by mouth every evening.     potassium chloride SA (KLOR-CON M) 20 MEQ tablet Take 2 tablets (40 mEq total) by mouth 2 (two) times  daily. 360 tablet 0   Tetrahydrozoline HCl (VISINE OP) Place 1 drop into both eyes daily as needed (irritation).     VITAMIN D PO Take 1 capsule by mouth daily.     vitamin E 180 MG (400 UNITS) capsule Take 400 IU daily x 1 week, then 400 IU BID (Patient taking differently: Take 400 Units by mouth daily.) 60 capsule 4   XTAMPZA ER 13.5 MG C12A Take 1 capsule by mouth in the morning and at bedtime. (every 12 hours) 60 capsule 0   No current facility-administered medications for this visit.    REVIEW OF SYSTEMS:   Constitutional: ( - ) fevers, ( - )  chills , ( - ) night sweats Eyes: ( - ) blurriness of vision, ( - ) double vision, ( - ) watery eyes Ears, nose, mouth, throat, and face: ( - ) mucositis, ( - ) sore throat Respiratory: ( - ) cough, ( + ) dyspnea, ( - ) wheezes Cardiovascular: ( - ) palpitation, ( - ) chest discomfort, ( - ) lower extremity swelling Gastrointestinal:  ( + ) nausea, ( - ) heartburn, ( - ) change in bowel habits Skin: ( - ) abnormal skin rashes Lymphatics: ( - ) new lymphadenopathy, ( - ) easy bruising Neurological: ( - ) numbness, ( - ) tingling, ( - ) new weaknesses Behavioral/Psych: ( - ) mood change, ( - ) new changes  All other systems were reviewed with the patient and are negative.  PHYSICAL EXAMINATION: ECOG PERFORMANCE STATUS: 1 - Symptomatic but completely ambulatory  Vitals:   04/04/23 1006  BP: 119/75  Pulse: 98  Resp: 17  Temp: 98.3 F (36.8 C)  SpO2: 98%       Filed Weights   04/04/23 1006  Weight: 148 lb (67.1 kg)        GENERAL: well appearing middle aged Caucasian female in NAD  SKIN: skin color,  texture, turgor are normal, no rashes or significant lesions EYES: conjunctiva are pink and non-injected, sclera clear LUNGS: wheezing at lung bases. clear to auscultation and percussion with normal breathing effort HEART: regular rate & rhythm and no murmurs and bilateral trace pitting lower extremity edema Musculoskeletal: no cyanosis of digits and no clubbing PSYCH: alert & oriented x 3, fluent speech NEURO: no focal motor/sensory deficits  LABORATORY DATA:  I have reviewed the data as listed    Latest Ref Rng & Units 04/04/2023    9:23 AM 03/15/2023   10:32 AM 02/21/2023   10:49 AM  CBC  WBC 4.0 - 10.5 K/uL 6.1  5.5  5.6   Hemoglobin 12.0 - 15.0 g/dL 16.1  09.6  04.5   Hematocrit 36.0 - 46.0 % 37.8  38.4  38.9   Platelets 150 - 400 K/uL 484  325  391        Latest Ref Rng & Units 04/04/2023    9:23 AM 03/15/2023   10:32 AM 02/21/2023   10:49 AM  CMP  Glucose 70 - 99 mg/dL 409  811  914   BUN 8 - 23 mg/dL 14  11  16    Creatinine 0.44 - 1.00 mg/dL 7.82  9.56  2.13   Sodium 135 - 145 mmol/L 141  141  140   Potassium 3.5 - 5.1 mmol/L 3.6  3.7  3.4   Chloride 98 - 111 mmol/L 103  102  103   CO2 22 - 32 mmol/L 31  31  30    Calcium 8.9 -  10.3 mg/dL 9.1  9.2  9.0   Total Protein 6.5 - 8.1 g/dL 6.7  6.2  6.9   Total Bilirubin 0.3 - 1.2 mg/dL 0.3  0.4  0.3   Alkaline Phos 38 - 126 U/L 64  63  54   AST 15 - 41 U/L 19  19  18    ALT 0 - 44 U/L 9  11  11       RADIOGRAPHIC STUDIES: I personally have viewed the radiographic studies below: No results found.   ASSESSMENT & PLAN Virginia [name] 67 y.o. female with medical history significant for metastatic adenocarcinoma of the lung presents for a follow up visit. 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   Previously we discussed Carboplatin/Pembrolizumab/Pemetrexed and the expected side effect from these medications.  We discussed the possible side effects of immunotherapy including colitis,  hepatitis, dermatitis, and pneumonitis.  Additionally we discussed the side effects of carboplatin which include suppression of blood counts, nephrotoxicity, and nausea.  Additionally we discussed pemetrexed which can also have effects on counts and would require supplementation with vitamin B12 and folic acid.  She voiced her understanding of these side effects and treatment plans moving forward.  I also noted that we would be able to drop the chemotherapy agents that she had intolerance to continue with pembrolizumab alone given her high TPS score.  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 Due to worsening dizziness, she resumed this from Cycle 25-27.  PLAN: --Labs from today were reviewed and adequate for treatment.  Labs today show creatinine 0.86, white blood cell 6.1, hemoglobin 12.9, MCV 100.8, and platelets of 484 --repeat CT CAP on 10/11/2022 showed stable disease with no evidence of progression. Next due in May 2024.  --Continue with Cycle 51, Day 1 of maintenance Pem/Pem. --RTC q 3 weeks for labs, follow up visit prior to Cycle 52.    #Nausea/Vomiting, improving -- provided patient with Zofran 4-8mg  q8h PRN and compazine 10mg  q6H for breakthrough PRN.  She is currently using Phenergan for breakthrough. --continue olanzpaine 2.5 mg PO QHS to help with nausea. Marked improvement since the addition of this medication on 01/29/2020.  --continue to monitor   #Hypokalemia, stable --likely 2/2 to decreased PO intake, hypovolemia, and BP medications --Potassium level 3.6 today.   --Currently on potassium chloride tablets, 40 mEq PO potassium in AM and in PM.   #Pain Control, stable --continue oxycodone 5mg  q4H PRN. Can increase dose to 10mg  q6H if pain is severe. She is taking  approximately 2-3 pills per day.  --continue Xtampza 13.5 mg ER BID for long acting support.  --continue to take with Senokot to prevent opioid induced constipation. 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 --nausea as above    #Brain Metastasis, stable --appreciate the assistance of Dr. Barbaraann Cao in treating her brain metastasis and symptoms. --radiation therapy to the brain completed on 11/16/2019.  --MRI brain on 04/29/2020 showed evidence of small brain bleed. Eliquis therapy was discontinued.  --MRI on 11/04/2020 showed concern for progression of intracranial disease.  --On 12/02/2020, patient underwent craniotomy, resection of progressive cerebellar mass by Dr. Venetia Maxon. 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  --  MRI on 12/23/2020 showed evidence of radiation necrosis. Dr. Barbaraann Cao evaluated the patient on 01/02/2021 with recommendations to add IV Bevacizumab 10 mg/kg q 3 weeks ( started 01/26/2021). Completed a full course of this therapy. --Patient was evaluated by Dr. Barbaraann Cao on 08/29/2021 and recommended to resume Bevacizumab due to worsening dizziness.  Which was given from Cycle 25-27.  Plan: --continue to follow with Dr. Barbaraann Cao  --Avastin added back in the patient's chemotherapy regimen. Will restart in 4 weeks from today (out from dental procedure).  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    # Hypertension: --Continue Norvasc to 10 mg daily.  --continue to monitor at home and in clinic.    No orders of the defined types were placed in this encounter.   All questions were answered. The patient knows to call the clinic with any problems, questions or concerns.  I have  spent a total of 30 minutes minutes of face-to-face and non-face-to-face time, preparing to see the patient, performing a medically appropriate examination, counseling and educating the patient,  documenting clinical information in the electronic health record, and care coordination.   Ulysees Barns, MD Department of Hematology/Oncology [facility] Phone: [phone] Pager: [phone] Email: [email or web address]   04/07/2023 6:24 PM   Literature Support:  Lynn Ito, Rodrguez-Abreu D, Naaman Plummer, Felip E, De Angelis F, Domine M, [place], Hochmair MJ, [place], [place], [address], [place], Grossi F, [place], Reck M, [place], [place], [place], Rubio-Viqueira B, Novello S, Kurata T, Gray JE, Vida J, Wei Z, Yang J, Raftopoulos H, [place], [place] [employer]; [id number] Investigators. Pembrolizumab plus Chemotherapy in Metastatic Non-Small-Cell Lung Cancer. Malva Limes Med. 2018 May 31;378(22):2078-2092.  --Median progression-free survival was 8.8 months (95% CI, 7.6 to 9.2) in the pembrolizumab-combination group and 4.9 months (95% CI, 4.7 to 5.5) in the placebo-combination group (hazard ratio for disease progression or death, 0.52; 95% CI, 0.43 to 0.64; P<0.001). Adverse events of grade 3 or higher occurred in 67.2% of the patients in the pembrolizumab-combination group and in 65.8% of those in the placebo-combination group.  Cristal Generous L. Efficacy of pemetrexed-based regimens in advanced non-small cell lung cancer patients with activating epidermal growth factor receptor mutations after tyrosine kinase inhibitor failure: a systematic review. Onco Targets Ther. 2018;11:2121-2129.   --The weighted median PFS, median OS, and ORR for patients treated with pem regimens were 5.09 months, 15.91 months, and 30.19%, respectively. Our systematic review results showed a favorable efficacy profile of pem regimens in NSCLC patients with  EGFR mutation after EGFR-TKI failure.

## 2023-04-05 ENCOUNTER — Ambulatory Visit: Payer: Medicare Other

## 2023-04-05 ENCOUNTER — Ambulatory Visit: Payer: Medicare Other | Admitting: Hematology and Oncology

## 2023-04-05 ENCOUNTER — Other Ambulatory Visit: Payer: Medicare Other

## 2023-04-07 ENCOUNTER — Encounter: Payer: Self-pay | Admitting: Hematology and Oncology

## 2023-04-11 ENCOUNTER — Telehealth: Payer: Self-pay | Admitting: *Deleted

## 2023-04-11 NOTE — Telephone Encounter (Signed)
Received vm message from pt's husband, Harvie Heck.  He states they forgot to mention to Dr. [name] @ her last visit that she gets occasional sharp pains in her chest when she takes a deep breath. This has not gotten worse, but hasn't improved either.  Dr. [name] made aware.

## 2023-04-15 ENCOUNTER — Telehealth: Payer: Self-pay | Admitting: *Deleted

## 2023-04-15 ENCOUNTER — Other Ambulatory Visit: Payer: Self-pay | Admitting: Hematology and Oncology

## 2023-04-15 NOTE — Telephone Encounter (Signed)
Received call from pt's husband. He states that Virginia [name]'s urine was dark and "grainy" over the weekend. He was uncertain if it was blood or just darker urine. No blood on the tissue. Virginia [name] drank more fluids when this occurred and her urine has gotten lighter, though not as light as in previous days. Denies fever, chills, dysuria, flank pain. Advised to continue to push extra po fluids over the next few days. Water etc including cranberry juice. Advised to call back if it persists or if she develops any of the above mentioned symptoms. If she does, then we will have her come in for u/a and culture.  Harvie Heck is agreeable to this plan.

## 2023-04-25 ENCOUNTER — Other Ambulatory Visit: Payer: Self-pay | Admitting: Hematology and Oncology

## 2023-04-26 ENCOUNTER — Other Ambulatory Visit: Payer: Self-pay

## 2023-04-26 ENCOUNTER — Inpatient Hospital Stay (HOSPITAL_BASED_OUTPATIENT_CLINIC_OR_DEPARTMENT_OTHER): Payer: Medicare Other | Admitting: Physician Assistant

## 2023-04-26 ENCOUNTER — Inpatient Hospital Stay: Payer: Medicare Other | Attending: Hematology and Oncology

## 2023-04-26 ENCOUNTER — Inpatient Hospital Stay: Payer: Medicare Other

## 2023-04-26 VITALS — BP 105/77 | HR 95 | Resp 16

## 2023-04-26 VITALS — BP 115/91 | HR 92 | Temp 98.6°F | Resp 17 | Ht 62.0 in | Wt 145.0 lb

## 2023-04-26 DIAGNOSIS — C3431 Malignant neoplasm of lower lobe, right bronchus or lung: Secondary | ICD-10-CM | POA: Diagnosis present

## 2023-04-26 DIAGNOSIS — R42 Dizziness and giddiness: Secondary | ICD-10-CM | POA: Diagnosis not present

## 2023-04-26 DIAGNOSIS — Z79899 Other long term (current) drug therapy: Secondary | ICD-10-CM | POA: Diagnosis not present

## 2023-04-26 DIAGNOSIS — R630 Anorexia: Secondary | ICD-10-CM | POA: Insufficient documentation

## 2023-04-26 DIAGNOSIS — R82998 Other abnormal findings in urine: Secondary | ICD-10-CM

## 2023-04-26 DIAGNOSIS — R918 Other nonspecific abnormal finding of lung field: Secondary | ICD-10-CM

## 2023-04-26 DIAGNOSIS — Z90710 Acquired absence of both cervix and uterus: Secondary | ICD-10-CM | POA: Diagnosis not present

## 2023-04-26 DIAGNOSIS — Z7901 Long term (current) use of anticoagulants: Secondary | ICD-10-CM | POA: Insufficient documentation

## 2023-04-26 DIAGNOSIS — C7931 Secondary malignant neoplasm of brain: Secondary | ICD-10-CM

## 2023-04-26 DIAGNOSIS — C3491 Malignant neoplasm of unspecified part of right bronchus or lung: Secondary | ICD-10-CM | POA: Diagnosis not present

## 2023-04-26 DIAGNOSIS — Z5111 Encounter for antineoplastic chemotherapy: Secondary | ICD-10-CM | POA: Insufficient documentation

## 2023-04-26 DIAGNOSIS — R590 Localized enlarged lymph nodes: Secondary | ICD-10-CM | POA: Diagnosis not present

## 2023-04-26 DIAGNOSIS — Z923 Personal history of irradiation: Secondary | ICD-10-CM | POA: Diagnosis not present

## 2023-04-26 DIAGNOSIS — R112 Nausea with vomiting, unspecified: Secondary | ICD-10-CM | POA: Diagnosis not present

## 2023-04-26 DIAGNOSIS — Z8673 Personal history of transient ischemic attack (TIA), and cerebral infarction without residual deficits: Secondary | ICD-10-CM | POA: Insufficient documentation

## 2023-04-26 DIAGNOSIS — Z5112 Encounter for antineoplastic immunotherapy: Secondary | ICD-10-CM | POA: Diagnosis present

## 2023-04-26 DIAGNOSIS — R634 Abnormal weight loss: Secondary | ICD-10-CM | POA: Insufficient documentation

## 2023-04-26 DIAGNOSIS — Z9049 Acquired absence of other specified parts of digestive tract: Secondary | ICD-10-CM | POA: Diagnosis not present

## 2023-04-26 DIAGNOSIS — E876 Hypokalemia: Secondary | ICD-10-CM | POA: Diagnosis not present

## 2023-04-26 DIAGNOSIS — Z86718 Personal history of other venous thrombosis and embolism: Secondary | ICD-10-CM | POA: Diagnosis not present

## 2023-04-26 DIAGNOSIS — C7951 Secondary malignant neoplasm of bone: Secondary | ICD-10-CM | POA: Insufficient documentation

## 2023-04-26 DIAGNOSIS — G939 Disorder of brain, unspecified: Secondary | ICD-10-CM | POA: Insufficient documentation

## 2023-04-26 LAB — URINALYSIS, COMPLETE (UACMP) WITH MICROSCOPIC
Bilirubin Urine: NEGATIVE
Glucose, UA: NEGATIVE mg/dL
Ketones, ur: NEGATIVE mg/dL
Leukocytes,Ua: NEGATIVE
Nitrite: NEGATIVE
Protein, ur: 100 mg/dL — AB
RBC / HPF: >50 RBC/hpf (ref 0–5)
Specific Gravity, Urine: 1.008 (ref 1.005–1.030)
pH: 6 (ref 5.0–8.0)

## 2023-04-26 LAB — CMP (CANCER CENTER ONLY)
ALT: 8 U/L (ref 0–44)
AST: 18 U/L (ref 15–41)
Albumin: 2.9 g/dL — ABNORMAL LOW (ref 3.5–5.0)
Alkaline Phosphatase: 61 U/L (ref 38–126)
Anion gap: 6 (ref 5–15)
BUN: 11 mg/dL (ref 8–23)
CO2: 31 mmol/L (ref 22–32)
Calcium: 8.4 mg/dL — ABNORMAL LOW (ref 8.9–10.3)
Chloride: 103 mmol/L (ref 98–111)
Creatinine: 1.09 mg/dL — ABNORMAL HIGH (ref 0.44–1.00)
GFR, Estimated: 56 mL/min — ABNORMAL LOW (ref >60)
Glucose, Bld: 104 mg/dL — ABNORMAL HIGH (ref 70–99)
Potassium: 3.6 mmol/L (ref 3.5–5.1)
Sodium: 140 mmol/L (ref 135–145)
Total Bilirubin: 0.4 mg/dL (ref 0.3–1.2)
Total Protein: 6.8 g/dL (ref 6.5–8.1)

## 2023-04-26 LAB — CBC WITH DIFFERENTIAL (CANCER CENTER ONLY)
Abs Immature Granulocytes: 0.02 10*3/uL (ref 0.00–0.07)
Basophils Absolute: 0 10*3/uL (ref 0.0–0.1)
Basophils Relative: 1 %
Eosinophils Absolute: 0.1 10*3/uL (ref 0.0–0.5)
Eosinophils Relative: 2 %
HCT: 33.5 % — ABNORMAL LOW (ref 36.0–46.0)
Hemoglobin: 11.4 g/dL — ABNORMAL LOW (ref 12.0–15.0)
Immature Granulocytes: 0 %
Lymphocytes Relative: 36 %
Lymphs Abs: 2.3 10*3/uL (ref 0.7–4.0)
MCH: 33.6 pg (ref 26.0–34.0)
MCHC: 34 g/dL (ref 30.0–36.0)
MCV: 98.8 fL (ref 80.0–100.0)
Monocytes Absolute: 1 10*3/uL (ref 0.1–1.0)
Monocytes Relative: 15 %
Neutro Abs: 2.9 10*3/uL (ref 1.7–7.7)
Neutrophils Relative %: 46 %
Platelet Count: 392 10*3/uL (ref 150–400)
RBC: 3.39 MIL/uL — ABNORMAL LOW (ref 3.87–5.11)
RDW: 13.9 % (ref 11.5–15.5)
WBC Count: 6.4 10*3/uL (ref 4.0–10.5)
nRBC: 0 % (ref 0.0–0.2)

## 2023-04-26 LAB — TSH: TSH: 2.892 u[IU]/mL (ref 0.350–4.500)

## 2023-04-26 MED ORDER — PROCHLORPERAZINE MALEATE 10 MG PO TABS
10.0000 mg | ORAL_TABLET | Freq: Once | ORAL | Status: AC
  Administered 2023-04-26: 10 mg via ORAL
  Filled 2023-04-26: qty 1

## 2023-04-26 MED ORDER — FUROSEMIDE 20 MG PO TABS: 40.0000 mg | ORAL_TABLET | Freq: Two times a day (BID) | ORAL | 0 refills | Status: DC

## 2023-04-26 MED ORDER — SODIUM CHLORIDE 0.9 % IV SOLN
200.0000 mg | Freq: Once | INTRAVENOUS | Status: AC
  Administered 2023-04-26: 200 mg via INTRAVENOUS
  Filled 2023-04-26: qty 200

## 2023-04-26 MED ORDER — SODIUM CHLORIDE 0.9 % IV SOLN
500.0000 mg/m2 | Freq: Once | INTRAVENOUS | Status: AC
  Administered 2023-04-26: 900 mg via INTRAVENOUS
  Filled 2023-04-26: qty 20

## 2023-04-26 MED ORDER — SODIUM CHLORIDE 0.9 % IV SOLN: Freq: Once | INTRAVENOUS | Status: AC

## 2023-04-26 NOTE — Progress Notes (Signed)
Per Georga Kaufmann, [name] - okay to proceed with treatment with elevated diastolic BP of 91.

## 2023-04-26 NOTE — Progress Notes (Signed)
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 CT of the head showed showed concerning for metastatic disease involving the left cerebellum and right occipital lobe.  2) 09/30/2019: MRI brain confirms mass within the left cerebellum measures 1.6 x 1.3 x 1.5 cm as well as at least 8 metastatic lesions within the supratentorial and infratentorial brain as outlined 3) 10/01/2019: PET CT scan revealed hypermetabolic infrahilar right lower lobe mass with hypermetabolic nodal metastases in the right hilum, mediastinum, right supraclavicular region, left axilla and lower left neck. 4) 10/08/2019: US guided biopsy of the lymph nodes confirms poorly differentiated non-small cell carcinoma. Immunohistochemistry confirms adenocarcinoma of lung primary. PD-L1 and NGS later revealed a MET Exon 14 mutation and TPS of 90%.  5) 10/12/2019: Establish care with Dr. [name]  6) 11/04/2019: Day 1 of capmatinib 400mg  BID 7) 11/09/2019: presented to Rad/Onc with a DVT in LLE. Seen in symptom management clinic and started on apixaban.  8) 12/29/2019: CT C/A/P showed interval decrease in size of mass involving the superior segment of right lower lobe and reduction in mediastinal and left supraclavicular adenopathy though some small spinal lesions were noted in the interim between the two sets of scans 9) 01/25/2020: temporarily stopped capmatinib due to symptoms of nausea/fatigue. Restarted therapy on 01/30/2020 after brief chemo holiday.  10)  03/22/2020: CT C/A/P reveals a mixed picture, with new lung nodule and increased lymphadenopathy, however response in bone lesions. D/c capmatinib therapy 11) 04/08/2020: start of Carbo/Pem/Pem for 2nd line treatment. Cycle 1 Day 1   12) 04/29/2020: Cycle 2 Day 1 Carbo/Pem/Pem  13) 05/20/2020: Cycle 3 Day 1 Carbo/Pem/Pem  14) 06/09/2020: Cycle 4 Day 1 Carbo/Pem/Pem  15) 06/29/2020: Cycle 5 Day 1 maintenance Pem/Pem  16) 07/20/2020: Cycle 6 Day 1 maintenance Pem/Pem  17) 08/12/2020: Cycle 7 Day 1 maintenance Pem/Pem  18) 09/01/2020: Cycle 8 Day 1 maintenance Pem/Pem  19) 09/21/2020: Cycle 9 Day 1 maintenance Pem/Pem  20) 10/13/2020: Cycle 10 Day 1 maintenance Pem/Pem  21) 11/03/2020: Cycle 11 Day 1 maintenance Pem/Pem  22) 11/25/2020:  Cycle 12 Day 1 maintenance Pem/Pem 23) 12/15/2020:  Cycle 13 Day 1 maintenance Pem/Pem 24) 01/05/2021:  Cycle 14 Day 1 maintenance Pem/Pem 25) 01/26/2021:  Cycle 15 Day 1 maintenance Pem/Pem plus Bevacizumab.  02/17/2021: Cycle 16 Day 1 maintenance Pem/Pem plus Bevacizumab.  03/10/2021: Cycle 17 Day 1 maintenance Pem/Pem plus Bevacizumab.  03/31/2021: Cycle 18 Day 1 maintenance Pem/Pem  04/20/2021:  Cycle 19 Day 1 maintenance Pem/Pem  05/12/2021: Cycle 20 Day 1 maintenance Pem/Pem 06/01/2021: Cycle 21 Day 1 maintenance Pem/Pem 06/22/2021: Cycle 22 Day 1 maintenance Pem/Pem 07/13/2021: Cycle 23 Day 1 maintenance Pem/Pem 08/16/2021: Cycle 24 Day 1 maintenance Pem/Pem 09/08/2021: Cycle 25 Day 1 maintenance Pem/Pem plus Bevacizumab 09/28/2021: Cycle 26 Day 1 maintenance Pem/Pem plus Bevacizumab 10/20/2021: Cycle 27 Day 1 maintenance Pem/Pem plus Bevacizumab 11/09/2021: Cycle 28 Day 1 maintenance Pem/Pem  12/06/2021: Cycle 29 Day 1 maintenance Pem/Pem  12/27/2021: Cycle 30 Day 1 maintenance Pem/Pem  01/18/2022: Cycle 31 Day 1 maintenance Pem/Pem  02/07/2022: Cycle 32 Day  1 maintenance Pem/Pem  02/28/2022: Cycle 33 Day 1 maintenance Pem/Pem  03/23/2022: Cycle 34 Day 1 maintenance Pem/Pem   04/13/2022: Cycle 35 Day 1 maintenance Pem/Pem  05/04/2022: Cycle 36 Day 1 maintenance Pem/Pem  05/25/2022: Cycle 37 Day 1 maintenance Pem/Pem  06/14/2022: Cycle 38 Day 1 maintenance Pem/Pem 07/05/2022: Cycle 39 Day 1 maintenance Pem/Pem 08/13/2022: Cycle 40 Day 1 maintenance Pem/Pem 09/06/2022:Cycle 41 Day 1 maintenance Pem/Pem 09/27/2022: Cycle 42 Day 1 maintenance Pem/Pem 10/19/2022: Cycle 43 Day 1 maintenance Pem/Pem 11/09/2022: Cycle 44 Day 1 maintenance Pem/Pem 11/30/2022: Cycle 45 Day 1 maintenance Pem/Pem 12/20/2022: Cycle 46 Day 1 maintenance Pem/Pem 01/10/2023: Cycle 47 Day 1 maintenance Pem/Pem 01/31/2023:Cycle 48 Day 1 maintenance Pem/Pem 02/22/2023: Cycle 49 Day 1 maintenance Pem/Pem 03/15/2023: Cycle 50 Day 1 maintenance Pem/Pem 04/04/2023: Cycle 51 Day 1 maintenance Pem/Pem 04/26/2023: Cycle 52 Day 1 maintenance Pem/Pem  Interval History:  Virginia [name] 67 y.o. female with medical history significant for metastatic adenocarcinoma of the lung presents for a follow up visit. The patient's last visit was on 04/04/2023.  In the interim since the last visit she has continued on maintenance Pem/Pem.   On exam today [name] reports her energy levels are stable. She reports decreased appetite with 3 lb weight loss since last visit. She adds that she generally only eats one meal a day. She does have intermittent episodes of nausea with 1-2 episodes of vomiting per week. She takes her prescribed nausea medications with improvement of symptoms. She has regular bowel movements while on daily miralax and senna as needed. She denies easy bruising or signs of active bleeding. She has noticed brownish color urine without dysuria, hematuria or increased urinary frequency. She has ongoing dizziness and takes meclizine as needed.  She denies any fevers, chills, sweats or chest pain. She has no other complaints.  Full 10 point ROS was otherwise negative  MEDICAL HISTORY:  Past Medical History:  Diagnosis  Date   Anemia    Anxiety    Concussion 09/28/2019   DVT (deep venous thrombosis) (HCC) 2021   L leg   Dyspnea    GERD (gastroesophageal reflux disease)    Hypercholesterolemia    per pt, she does not have elevated lipids   Hypertension    met lung ca dx'd 09/2019   mets to spine, hip and brain   PONV (postoperative nausea and vomiting)    Tobacco abuse     SURGICAL HISTORY: Past Surgical History:  Procedure Laterality Date   ABDOMINAL HYSTERECTOMY     partial/ left ovaries   APPLICATION OF CRANIAL NAVIGATION N/A 12/02/2020   Procedure: APPLICATION OF CRANIAL NAVIGATION;  Surgeon: Maeola Harman, MD;  Location: [facility] OR;  Service: Neurosurgery;  Laterality: N/A;   CHOLECYSTECTOMY     CRANIOTOMY N/A 12/02/2020   Procedure: Posterior fossa craniotomy for tumor resection with brainlab;  Surgeon: Maeola Harman, MD;  Location: [facility] OR;  Service: Neurosurgery;  Laterality: N/A;   DILATION AND CURETTAGE OF UTERUS     IR IMAGING GUIDED PORT INSERTION  10/23/2019   IR PATIENT EVAL TECH 0-60 MINS  11/21/2021   IR PATIENT EVAL TECH 0-60 MINS  11/24/2021   IR PATIENT EVAL TECH 0-60 MINS  11/29/2021   IR PATIENT EVAL TECH 0-60 MINS  12/04/2021   IR PATIENT EVAL TECH 0-60 MINS  12/12/2021   IR PATIENT EVAL TECH 0-60 MINS  12/19/2021   IR PATIENT EVAL TECH 0-60 MINS  12/27/2021   IR PATIENT EVAL TECH 0-60 MINS  01/03/2022   IR PATIENT EVAL TECH 0-60 MINS  01/10/2022   IR PATIENT EVAL TECH 0-60 MINS  01/18/2022   IR PATIENT EVAL TECH 0-60 MINS  02/13/2022   IR PATIENT EVAL TECH 0-60 MINS  02/20/2022   IR RADIOLOGIST EVAL & MGMT  01/23/2022   IR REMOVAL TUN ACCESS W/ PORT W/O FL MOD SED  11/17/2021   KYPHOPLASTY N/A 03/15/2020   Procedure: Thoracic Eight KYPHOPLASTY;  Surgeon: Maeola Harman, MD;  Location: [facility] OR;  Service: Neurosurgery;  Laterality: N/A;  prone    LIPOMA EXCISION  2018   removed under left breast and right thigh.   TUBAL LIGATION      ALLERGIES:  has No Known Allergies.  MEDICATIONS:  Current  Outpatient Medications  Medication Sig Dispense Refill   acetaminophen (TYLENOL) 500 MG tablet Take 500 mg by mouth 2 (two) times daily as needed for moderate pain.     albuterol (VENTOLIN HFA) 108 (90 Base) MCG/ACT inhaler INHALE 2 PUFFS INTO THE LUNGS EVERY 6 HOURS AS NEEDED FOR WHEEZING OR SHORTNESS OF BREATH 6.7 g 11   ALPRAZolam (XANAX) 0.25 MG tablet TAKE 1 TABLET(0.25 MG) BY MOUTH TWICE DAILY AS NEEDED FOR ANXIETY 60 tablet 0   amLODipine (NORVASC) 10 MG tablet TAKE 1 TABLET BY MOUTH EVERY DAY 90 tablet 3   Cyanocobalamin (VITAMIN B12 PO) Take 1 tablet by mouth daily. Gummies     EPINEPHrine 0.3 mg/0.3 mL IJ SOAJ injection Inject 0.3 mg into the muscle as needed for anaphylaxis. 2 each 0   folic acid (FOLVITE) 1 MG tablet TAKE 1 TABLET BY MOUTH DAILY 90 tablet 1   furosemide (LASIX) 20 MG tablet Take 2 tablets (40 mg total) by mouth 2 times daily. 360 tablet 0   gabapentin (NEURONTIN) 300 MG capsule Take 1 capsule (300 mg total) by mouth at bedtime. 30 capsule 0   Glucosamine HCl (GLUCOSAMINE PO) Take 2 tablets by mouth daily.     LYSINE PO Take 1 tablet by mouth daily.     meclizine (ANTIVERT) 25 MG tablet Take 1 tablet (25 mg total) by mouth 3 (three) times daily as needed for dizziness. 30 tablet 0   Multiple Vitamin (MULTIVITAMIN WITH MINERALS) TABS tablet Take 1 tablet by mouth daily.     Multiple Vitamins-Minerals (AIRBORNE PO) Take 1 tablet by mouth daily.      OLANZapine (ZYPREXA) 2.5 MG tablet TAKE 1 TABLET BY MOUTH AT BEDTIME 90 tablet 1   ondansetron (ZOFRAN) 4 MG tablet TAKE 1 TABLET(4 MG) BY MOUTH EVERY 8 HOURS AS NEEDED FOR NAUSEA OR VOMITING 40 tablet 2   oxyCODONE (OXY IR/ROXICODONE) 5 MG immediate release tablet TAKE 1 TO 2 TABLETS BY MOUTH EVERY 4 HOURS AS NEEDED FOR SEVERE PAIN 90 tablet 0   oxyCODONE (OXY IR/ROXICODONE) 5 MG immediate release tablet Take 1 tablet (5 mg total) by mouth every 4 (four) hours as needed for severe pain. 90 tablet 0   pantoprazole (PROTONIX)  40 MG tablet Take 1 tablet (40 mg total) by mouth daily. 90 tablet 3   polyethylene glycol (MIRALAX / GLYCOLAX) 17 g packet Take 17 g by mouth every evening.     potassium chloride SA (KLOR-CON M) 20 MEQ tablet Take 2 tablets (40 mEq total) by mouth 2 (two) times daily. 360 tablet 0   Tetrahydrozoline HCl (VISINE OP) Place 1 drop into both eyes daily as needed (irritation).     VITAMIN D PO Take 1 capsule by mouth daily.  vitamin E 180 MG (400 UNITS) capsule Take 400 IU daily x 1 week, then 400 IU BID (Patient taking differently: Take 400 Units by mouth daily.) 60 capsule 4   XTAMPZA ER 13.5 MG C12A Take 1 capsule by mouth in the morning and at bedtime. (every 12 hours) 60 capsule 0   OVER THE COUNTER MEDICATION Take 2 tablets by mouth daily. Beet root     No current facility-administered medications for this visit.    REVIEW OF SYSTEMS:   Constitutional: ( - ) fevers, ( - )  chills , ( - ) night sweats Eyes: ( - ) blurriness of vision, ( - ) double vision, ( - ) watery eyes Ears, nose, mouth, throat, and face: ( - ) mucositis, ( - ) sore throat Respiratory: ( - ) cough, ( + ) dyspnea, ( - ) wheezes Cardiovascular: ( - ) palpitation, ( - ) chest discomfort, ( - ) lower extremity swelling Gastrointestinal:  ( + ) nausea, ( - ) heartburn, ( - ) change in bowel habits Skin: ( - ) abnormal skin rashes Lymphatics: ( - ) new lymphadenopathy, ( - ) easy bruising Neurological: ( - ) numbness, ( - ) tingling, ( - ) new weaknesses Behavioral/Psych: ( - ) mood change, ( - ) new changes  All other systems were reviewed with the patient and are negative.  PHYSICAL EXAMINATION: ECOG PERFORMANCE STATUS: 1 - Symptomatic but completely ambulatory  Vitals:   04/26/23 1303  BP: (!) 129/91  Pulse: 92  Resp: 17  Temp: 98.6 F (37 C)  SpO2: 97%      Filed Weights   04/26/23 1303  Weight: 145 lb (65.8 kg)    GENERAL: well appearing middle aged Caucasian female in NAD  SKIN: skin color,  texture, turgor are normal, no rashes or significant lesions EYES: conjunctiva are pink and non-injected, sclera clear LUNGS: wheezing at lung bases. clear to auscultation and percussion with normal breathing effort HEART: regular rate & rhythm and no murmurs and bilateral trace pitting lower extremity edema Musculoskeletal: no cyanosis of digits and no clubbing PSYCH: alert & oriented x 3, fluent speech NEURO: no focal motor/sensory deficits  LABORATORY DATA:  I have reviewed the data as listed    Latest Ref Rng & Units 04/26/2023   12:28 PM 04/04/2023    9:23 AM 03/15/2023   10:32 AM  CBC  WBC 4.0 - 10.5 K/uL 6.4  6.1  5.5   Hemoglobin 12.0 - 15.0 g/dL 64.4  03.4  74.2   Hematocrit 36.0 - 46.0 % 33.5  37.8  38.4   Platelets 150 - 400 K/uL 392  484  325        Latest Ref Rng & Units 04/26/2023   12:28 PM 04/04/2023    9:23 AM 03/15/2023   10:32 AM  CMP  Glucose 70 - 99 mg/dL 595  638  756   BUN 8 - 23 mg/dL 11  14  11    Creatinine 0.44 - 1.00 mg/dL 4.33  2.95  1.88   Sodium 135 - 145 mmol/L 140  141  141   Potassium 3.5 - 5.1 mmol/L 3.6  3.6  3.7   Chloride 98 - 111 mmol/L 103  103  102   CO2 22 - 32 mmol/L 31  31  31    Calcium 8.9 - 10.3 mg/dL 8.4  9.1  9.2   Total Protein 6.5 - 8.1 g/dL 6.8  6.7  6.2   Total Bilirubin  0.3 - 1.2 mg/dL 0.4  0.3  0.4   Alkaline Phos 38 - 126 U/L 61  64  63   AST 15 - 41 U/L 18  19  19    ALT 0 - 44 U/L 8  9  11       RADIOGRAPHIC STUDIES: I personally have viewed the radiographic studies below: MR BRAIN W WO CONTRAST  Result Date: 04/11/2023 CLINICAL DATA:  Lung cancer with brain metastases EXAM: MRI HEAD WITHOUT AND WITH CONTRAST TECHNIQUE: Multiplanar, multiecho pulse sequences of the brain and surrounding structures were obtained without and with intravenous contrast. CONTRAST:  6 cc of vueway intravenous COMPARISON:  10/11/2022 FINDINGS: Brain: Widespread progressive finding. Chronic extensive patchy enhancement in the left more than right  cerebellum with central necrotic features. The degree of enhancement at the periphery of these lesions is thicker, although the overall extent is similar. Peripheral stippled enhancement especially at the upper vermis is progressed. Other lesions/lesion clusters: *Anterior right temporal lobe measuring 5 mm on 14:65 with smaller subjacent lesion on 14:62 *Cluster of multiple enhancing lesions in the right occipital lobe, largest and representative measurement of 6 mm on 14:66 *Low right parietal cortex measuring 7 mm on 14:84 *Enlarged right parietal lesion on 14:104, currently measuring 11 mm *Closely adjacent right parietal cortical lesion measuring 3 mm on 14:112 *Three lesions seen in the left frontal lobe on slice 14:126, all subcentimeter. The most anterior was present on prior but increased and measures up to 8 mm. *Bilobed punctate enhancement in the superior right frontal parietal cortex on a 14:135 *Punctate enhancement in the superior left frontal cortex on 14:142 Smooth generalized dural thickening. No acute infarct, hemorrhage, hydrocephalus, or shift. Lesions are associated with vasogenic edema most extensive in the upper bilateral cerebellum and second most extensive in the right parietal lobe. Small areas of new vasogenic edema seen in the bifrontal white matter. No associated hydrocephalus, acute hemorrhage, or ventricular obstruction. Vascular: Major flow voids and vascular enhancements are preserved Skull and upper cervical spine: Unremarkable left occipital craniotomy site. Sinuses/Orbits: Negative IMPRESSION: Widespread progression. Increased enhancement and new or recurrent enhancement of numerous lesions when compared to 10/11/2022 brain MRI. Multifocal vasogenic edema most notable in the cerebellum, progressed. Electronically Signed   By: Tiburcio Pea M.D.   On: 04/11/2023 19:08     ASSESSMENT & PLAN TEIGEN BROOKOVER is a 67 y.o. female with medical history significant for metastatic  adenocarcinoma of the lung presents for a follow up visit. 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   Previously we discussed Carboplatin/Pembrolizumab/Pemetrexed and the expected side effect from these medications.  We discussed the possible side effects of immunotherapy including colitis, hepatitis, dermatitis, and pneumonitis.  Additionally we discussed the side effects of carboplatin which include suppression of blood counts, nephrotoxicity, and nausea.  Additionally we discussed pemetrexed which can also have effects on counts and would require supplementation with vitamin B12 and folic acid.  She voiced her understanding of these side effects and treatment plans moving forward.  I also noted that we would be able to drop the chemotherapy agents that she had intolerance to continue with pembrolizumab alone given her high TPS score.  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 Due to worsening dizziness, she resumed  this from Cycle 25-27.  --Most recent CT CAP from 02/16/2023 showed stable disease.  PLAN: --Due for Cycle 52, Day 1 of maintenance Pem/Pem today --Labs from today were reviewed and adequate for treatment.  Labs today show white blood cell count 6.4, hemoglobin 11.4, MCV 98.8, and platelets of 392. Creatinine 1.09, LFTs normal.  --Proceed with treatment today without any dose modifications.  --RTC q 3 weeks for labs, follow up visit prior to Cycle 53.    #Appetite loss/weight loss: --Encouraged high protein diet and supplement with protein shakes --Advised to eat small, frequent meals.   #Brain Metastasis, stable --appreciate the assistance of Dr. Barbaraann Cao in treating her brain metastasis and symptoms. --radiation therapy to the brain  completed on 11/16/2019.  --MRI brain on 04/29/2020 showed evidence of small brain bleed. Eliquis therapy was discontinued.  --MRI on 11/04/2020 showed concern for progression of intracranial disease.  --On 12/02/2020, patient underwent craniotomy, resection of progressive cerebellar mass by Dr. Venetia Maxon. 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  --MRI on 12/23/2020 showed evidence of radiation necrosis. Dr. Barbaraann Cao evaluated the patient on 01/02/2021 with recommendations to add IV Bevacizumab 10 mg/kg q 3 weeks ( started 01/26/2021). Completed a full course of this therapy. --Patient was evaluated by Dr. Barbaraann Cao on 08/29/2021 and recommended to resume Bevacizumab due to worsening dizziness.  Which was given from Cycle 25-27.  Plan: --continue to follow with Dr. Barbaraann Cao  --Most recent MRI from 04/01/2023 showed increased enhancement. Dr. 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 Plan to repeat MRI brain on 06/06/2023.  --last dose of Bevacizumab on 07/19/2022.   #Nausea/Vomiting, worsening --Regimen includes Zofran 4-8mg  q8h PRN and compazine 10mg  q6H for breakthrough PRN, Olanzpaine 2.5 mg PO QHS to help with nausea.  She is currently using Phenergan for breakthrough. --continue to monitor   #Hypokalemia --likely 2/2 to decreased PO intake, hypovolemia, and BP medications --Potassium level 3.6 today.   --Currently on potassium chloride tablets, 40 mEq PO potassium in AM and in PM.   #Pain Control, stable --continue oxycodone 5mg  q4H PRN. Can increase dose to 10mg  q6H if pain is severe. She is taking approximately 2-3 pills per day.  --continue Xtampza 13.5 mg ER BID for long acting support.  --continue to take with Senokot to prevent opioid induced constipation. 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 Next dose due today.      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    # Hypertension: --Continue Norvasc to 10 mg daily.  --continue to monitor at home and in clinic.    Orders Placed This Encounter  Procedures   Urine Culture    Standing Status:   Future    Standing Expiration Date:   04/25/2024   Urinalysis, Complete w Microscopic    Standing Status:   Future    Standing Expiration Date:   04/25/2024    All questions were answered. The patient knows to call the clinic with any problems, questions or concerns.  I have spent a total of 30 minutes minutes of face-to-face and non-face-to-face time, preparing to see the patient, performing a medically appropriate examination, counseling and educating the patient,  documenting clinical information in the electronic health record, and care coordination.  Georga Kaufmann [name] Dept of Hematology and Oncology [facility] Phone: [phone]    04/26/2023 1:18 PM   Literature Support:  Lynn Ito, Lawernce Ion, Naaman Plummer, Felip E, [place] F, Domine M, [place], Hochmair MJ, [place], [place], [address], [place], Grossi F, [place], Reck M, [place], [place], [place], Rubio-Viqueira B, Novello S, Kurata T, Gray JE, Vida J, Wei Z, Yang J, Raftopoulos H, [place], [place] [employer]; [id number] Investigators. Pembrolizumab plus Chemotherapy in Metastatic Non-Small-Cell Lung Cancer. Malva Limes Med. 2018 May 31;378(22):2078-2092.  --Median progression-free survival was 8.8 months (95% CI, 7.6 to 9.2) in the pembrolizumab-combination group and 4.9 months (95% CI, 4.7 to 5.5) in the placebo-combination group (hazard ratio for disease progression or death, 0.52; 95% CI, 0.43 to 0.64; P<0.001). Adverse events of grade 3 or higher occurred in 67.2% of the patients in the  pembrolizumab-combination group and in 65.8% of those in the placebo-combination group.  Cristal Generous L. Efficacy of pemetrexed-based regimens in advanced non-small cell lung cancer patients with activating epidermal growth factor receptor mutations after tyrosine kinase inhibitor failure: a systematic review. Onco Targets Ther. 2018;11:2121-2129.   --The weighted median PFS, median OS, and ORR for patients treated with pem regimens were 5.09 months, 15.91 months, and 30.19%, respectively. Our systematic review results showed a favorable efficacy profile of pem regimens in NSCLC patients with EGFR mutation after EGFR-TKI failure.

## 2023-04-26 NOTE — Patient Instructions (Signed)
[facility]   Discharge Instructions: Thank you for choosing [facility] to provide your oncology and hematology care.   If you have a lab appointment with the Cancer Center, please go directly to the Cancer Center and check in at the registration area.   Wear comfortable clothing and clothing appropriate for easy access to any Portacath or PICC line.   We strive to give you quality time with your provider. You may need to reschedule your appointment if you arrive late (15 or more minutes).  Arriving late affects you and other patients whose appointments are after yours.  Also, if you miss three or more appointments without notifying the office, you may be dismissed from the clinic at the provider's discretion.      For prescription refill requests, have your pharmacy contact our office and allow 72 hours for refills to be completed.    Today you received the following chemotherapy and/or immunotherapy agents: Pembrolizumab (Keytruda) and Pemetrexed (Alimta)      To help prevent nausea and vomiting after your treatment, we encourage you to take your nausea medication as directed.  BELOW ARE SYMPTOMS THAT SHOULD BE REPORTED IMMEDIATELY: *FEVER GREATER THAN 100.4 F (38 C) OR HIGHER *CHILLS OR SWEATING *NAUSEA AND VOMITING THAT IS NOT CONTROLLED WITH YOUR NAUSEA MEDICATION *UNUSUAL SHORTNESS OF BREATH *UNUSUAL BRUISING OR BLEEDING *URINARY PROBLEMS (pain or burning when urinating, or frequent urination) *BOWEL PROBLEMS (unusual diarrhea, constipation, pain near the anus) TENDERNESS IN MOUTH AND THROAT WITH OR WITHOUT PRESENCE OF ULCERS (sore throat, sores in mouth, or a toothache) UNUSUAL RASH, SWELLING OR PAIN  UNUSUAL VAGINAL DISCHARGE OR ITCHING   Items with * indicate a potential emergency and should be followed up as soon as possible or go to the Emergency Department if any problems should occur.  Please show the CHEMOTHERAPY ALERT  CARD or IMMUNOTHERAPY ALERT CARD at check-in to the Emergency Department and triage nurse.  Should you have questions after your visit or need to cancel or reschedule your appointment, please contact [facility]  Dept: [phone]  and follow the prompts.  Office hours are 8:00 a.m. to 4:30 p.m. Monday - Friday. Please note that voicemails left after 4:00 p.m. may not be returned until the following business day.  We are closed weekends and major holidays. You have access to a nurse at all times for urgent questions. Please call the main number to the clinic Dept: [phone] and follow the prompts.   For any non-urgent questions, you may also contact your provider using MyChart. We now offer e-Visits for anyone 18 and older to request care online for non-urgent symptoms. For details visit [email or web address].   Also download the MyChart app! Go to the app store, search "MyChart", open the app, select [facility], and log in with your MyChart username and password. 

## 2023-04-29 ENCOUNTER — Telehealth: Payer: Self-pay | Admitting: *Deleted

## 2023-04-29 NOTE — Telephone Encounter (Signed)
T[facility]. Per Georga Kaufmann, PA. U/a was negative for a UTI. We are waiting on the culture results. Denies any fevers this afternoon.  Encouraged increased fluid intake, cranberry juice. Advised to call back if symptoms persist in the next 2 days or so. Harvie Heck voiced understanding.

## 2023-04-29 NOTE — Telephone Encounter (Signed)
Received call from pt's husband, Harvie Heck. He is calling about [name]'s U/A done on Friday.  He also states her urine was a light color over the weekend but it is now dark and [name] told Harvie Heck she saw blood in her urine today, along with it being dark. Denies fever, pain  Please advise

## 2023-04-30 ENCOUNTER — Telehealth: Payer: Self-pay | Admitting: *Deleted

## 2023-04-30 DIAGNOSIS — R82998 Other abnormal findings in urine: Secondary | ICD-10-CM

## 2023-04-30 NOTE — Telephone Encounter (Signed)
Husband states Virginia [name]'s urine is more red-brown and was red last night. States they will be available tomorrow around 1:30 and can repeat UA/Culture.  Pt denies any burning or urgency with urination. Is pushing fluids. Georga Kaufmann, PA notified

## 2023-05-01 ENCOUNTER — Inpatient Hospital Stay: Payer: Medicare Other

## 2023-05-01 DIAGNOSIS — R531 Weakness: Secondary | ICD-10-CM | POA: Diagnosis not present

## 2023-05-01 DIAGNOSIS — R82998 Other abnormal findings in urine: Secondary | ICD-10-CM

## 2023-05-01 DIAGNOSIS — N179 Acute kidney failure, unspecified: Secondary | ICD-10-CM | POA: Diagnosis not present

## 2023-05-01 LAB — URINALYSIS, COMPLETE (UACMP) WITH MICROSCOPIC
Bilirubin Urine: NEGATIVE
Glucose, UA: NEGATIVE mg/dL
Ketones, ur: NEGATIVE mg/dL
Leukocytes,Ua: NEGATIVE
Nitrite: NEGATIVE
Protein, ur: 100 mg/dL — AB
Specific Gravity, Urine: 1.01 (ref 1.005–1.030)
pH: 6 (ref 5.0–8.0)

## 2023-05-02 ENCOUNTER — Institutional Professional Consult (permissible substitution) (HOSPITAL_BASED_OUTPATIENT_CLINIC_OR_DEPARTMENT_OTHER): Payer: Medicare Other | Admitting: Pulmonary Disease

## 2023-05-02 LAB — URINE CULTURE: Culture: NO GROWTH

## 2023-05-03 ENCOUNTER — Telehealth: Payer: Self-pay | Admitting: *Deleted

## 2023-05-03 ENCOUNTER — Other Ambulatory Visit: Payer: Self-pay

## 2023-05-03 ENCOUNTER — Emergency Department (HOSPITAL_COMMUNITY): Payer: Medicare Other

## 2023-05-03 ENCOUNTER — Inpatient Hospital Stay (HOSPITAL_COMMUNITY)
Admission: EM | Admit: 2023-05-03 | Discharge: 2023-05-11 | DRG: 682 | Disposition: A | Discharge status: Home Health | Payer: Medicare Other | Attending: Internal Medicine | Admitting: Internal Medicine

## 2023-05-03 ENCOUNTER — Encounter (HOSPITAL_COMMUNITY): Payer: Self-pay | Admitting: Emergency Medicine

## 2023-05-03 ENCOUNTER — Telehealth: Payer: Self-pay

## 2023-05-03 DIAGNOSIS — Z1152 Encounter for screening for COVID-19: Secondary | ICD-10-CM

## 2023-05-03 DIAGNOSIS — E860 Dehydration: Secondary | ICD-10-CM | POA: Diagnosis present

## 2023-05-03 DIAGNOSIS — J449 Chronic obstructive pulmonary disease, unspecified: Secondary | ICD-10-CM | POA: Diagnosis present

## 2023-05-03 DIAGNOSIS — R4589 Other symptoms and signs involving emotional state: Secondary | ICD-10-CM

## 2023-05-03 DIAGNOSIS — E876 Hypokalemia: Secondary | ICD-10-CM | POA: Diagnosis present

## 2023-05-03 DIAGNOSIS — Z87891 Personal history of nicotine dependence: Secondary | ICD-10-CM | POA: Diagnosis not present

## 2023-05-03 DIAGNOSIS — R319 Hematuria, unspecified: Secondary | ICD-10-CM | POA: Diagnosis present

## 2023-05-03 DIAGNOSIS — E785 Hyperlipidemia, unspecified: Secondary | ICD-10-CM | POA: Diagnosis not present

## 2023-05-03 DIAGNOSIS — Z79899 Other long term (current) drug therapy: Secondary | ICD-10-CM

## 2023-05-03 DIAGNOSIS — G9341 Metabolic encephalopathy: Secondary | ICD-10-CM | POA: Diagnosis present

## 2023-05-03 DIAGNOSIS — Z7189 Other specified counseling: Secondary | ICD-10-CM

## 2023-05-03 DIAGNOSIS — D6489 Other specified anemias: Secondary | ICD-10-CM | POA: Diagnosis present

## 2023-05-03 DIAGNOSIS — R110 Nausea: Secondary | ICD-10-CM | POA: Diagnosis not present

## 2023-05-03 DIAGNOSIS — C3490 Malignant neoplasm of unspecified part of unspecified bronchus or lung: Secondary | ICD-10-CM | POA: Diagnosis present

## 2023-05-03 DIAGNOSIS — Z9181 History of falling: Secondary | ICD-10-CM | POA: Diagnosis not present

## 2023-05-03 DIAGNOSIS — Z9049 Acquired absence of other specified parts of digestive tract: Secondary | ICD-10-CM

## 2023-05-03 DIAGNOSIS — E8809 Other disorders of plasma-protein metabolism, not elsewhere classified: Secondary | ICD-10-CM | POA: Diagnosis present

## 2023-05-03 DIAGNOSIS — F419 Anxiety disorder, unspecified: Secondary | ICD-10-CM | POA: Diagnosis present

## 2023-05-03 DIAGNOSIS — Z87820 Personal history of traumatic brain injury: Secondary | ICD-10-CM

## 2023-05-03 DIAGNOSIS — R42 Dizziness and giddiness: Secondary | ICD-10-CM | POA: Diagnosis not present

## 2023-05-03 DIAGNOSIS — G936 Cerebral edema: Secondary | ICD-10-CM | POA: Diagnosis present

## 2023-05-03 DIAGNOSIS — K219 Gastro-esophageal reflux disease without esophagitis: Secondary | ICD-10-CM | POA: Diagnosis present

## 2023-05-03 DIAGNOSIS — R4701 Aphasia: Secondary | ICD-10-CM | POA: Diagnosis present

## 2023-05-03 DIAGNOSIS — Z515 Encounter for palliative care: Secondary | ICD-10-CM

## 2023-05-03 DIAGNOSIS — R311 Benign essential microscopic hematuria: Secondary | ICD-10-CM | POA: Diagnosis not present

## 2023-05-03 DIAGNOSIS — G893 Neoplasm related pain (acute) (chronic): Secondary | ICD-10-CM | POA: Diagnosis present

## 2023-05-03 DIAGNOSIS — I10 Essential (primary) hypertension: Secondary | ICD-10-CM | POA: Diagnosis present

## 2023-05-03 DIAGNOSIS — N179 Acute kidney failure, unspecified: Secondary | ICD-10-CM | POA: Diagnosis present

## 2023-05-03 DIAGNOSIS — Z90710 Acquired absence of both cervix and uterus: Secondary | ICD-10-CM | POA: Diagnosis not present

## 2023-05-03 DIAGNOSIS — Z66 Do not resuscitate: Secondary | ICD-10-CM

## 2023-05-03 DIAGNOSIS — Z9851 Tubal ligation status: Secondary | ICD-10-CM

## 2023-05-03 DIAGNOSIS — Z86718 Personal history of other venous thrombosis and embolism: Secondary | ICD-10-CM

## 2023-05-03 DIAGNOSIS — Z923 Personal history of irradiation: Secondary | ICD-10-CM

## 2023-05-03 DIAGNOSIS — R5381 Other malaise: Secondary | ICD-10-CM

## 2023-05-03 DIAGNOSIS — C7931 Secondary malignant neoplasm of brain: Secondary | ICD-10-CM | POA: Diagnosis present

## 2023-05-03 DIAGNOSIS — E7800 Pure hypercholesterolemia, unspecified: Secondary | ICD-10-CM | POA: Diagnosis present

## 2023-05-03 DIAGNOSIS — I119 Hypertensive heart disease without heart failure: Secondary | ICD-10-CM | POA: Diagnosis present

## 2023-05-03 DIAGNOSIS — C7951 Secondary malignant neoplasm of bone: Secondary | ICD-10-CM | POA: Diagnosis present

## 2023-05-03 DIAGNOSIS — D649 Anemia, unspecified: Secondary | ICD-10-CM | POA: Diagnosis present

## 2023-05-03 DIAGNOSIS — Z8249 Family history of ischemic heart disease and other diseases of the circulatory system: Secondary | ICD-10-CM

## 2023-05-03 DIAGNOSIS — C3431 Malignant neoplasm of lower lobe, right bronchus or lung: Secondary | ICD-10-CM | POA: Diagnosis not present

## 2023-05-03 DIAGNOSIS — R279 Unspecified lack of coordination: Secondary | ICD-10-CM | POA: Diagnosis present

## 2023-05-03 DIAGNOSIS — R531 Weakness: Secondary | ICD-10-CM | POA: Diagnosis present

## 2023-05-03 DIAGNOSIS — R0989 Other specified symptoms and signs involving the circulatory and respiratory systems: Secondary | ICD-10-CM | POA: Diagnosis present

## 2023-05-03 DIAGNOSIS — Z79891 Long term (current) use of opiate analgesic: Secondary | ICD-10-CM

## 2023-05-03 DIAGNOSIS — R54 Age-related physical debility: Secondary | ICD-10-CM | POA: Diagnosis present

## 2023-05-03 LAB — CBC WITH DIFFERENTIAL/PLATELET
Abs Immature Granulocytes: 0.02 10*3/uL (ref 0.00–0.07)
Basophils Absolute: 0 10*3/uL (ref 0.0–0.1)
Basophils Relative: 0 %
Eosinophils Absolute: 0 10*3/uL (ref 0.0–0.5)
Eosinophils Relative: 0 %
HCT: 24.6 % — ABNORMAL LOW (ref 36.0–46.0)
Hemoglobin: 8.2 g/dL — ABNORMAL LOW (ref 12.0–15.0)
Immature Granulocytes: 1 %
Lymphocytes Relative: 22 %
Lymphs Abs: 1 10*3/uL (ref 0.7–4.0)
MCH: 34.3 pg — ABNORMAL HIGH (ref 26.0–34.0)
MCHC: 33.3 g/dL (ref 30.0–36.0)
MCV: 102.9 fL — ABNORMAL HIGH (ref 80.0–100.0)
Monocytes Absolute: 0.6 10*3/uL (ref 0.1–1.0)
Monocytes Relative: 14 %
Neutro Abs: 2.7 10*3/uL (ref 1.7–7.7)
Neutrophils Relative %: 63 %
Platelets: 201 10*3/uL (ref 150–400)
RBC: 2.39 MIL/uL — ABNORMAL LOW (ref 3.87–5.11)
RDW: 13.2 % (ref 11.5–15.5)
WBC: 4.3 10*3/uL (ref 4.0–10.5)
nRBC: 0 % (ref 0.0–0.2)

## 2023-05-03 LAB — COMPREHENSIVE METABOLIC PANEL
ALT: 13 U/L (ref 0–44)
AST: 20 U/L (ref 15–41)
Albumin: 2.2 g/dL — ABNORMAL LOW (ref 3.5–5.0)
Alkaline Phosphatase: 54 U/L (ref 38–126)
Anion gap: 7 (ref 5–15)
BUN: 16 mg/dL (ref 8–23)
CO2: 24 mmol/L (ref 22–32)
Calcium: 7.8 mg/dL — ABNORMAL LOW (ref 8.9–10.3)
Chloride: 103 mmol/L (ref 98–111)
Creatinine, Ser: 1.6 mg/dL — ABNORMAL HIGH (ref 0.44–1.00)
GFR, Estimated: 36 mL/min — ABNORMAL LOW (ref >60)
Glucose, Bld: 111 mg/dL — ABNORMAL HIGH (ref 70–99)
Potassium: 4.2 mmol/L (ref 3.5–5.1)
Sodium: 134 mmol/L — ABNORMAL LOW (ref 135–145)
Total Bilirubin: 0.6 mg/dL (ref 0.3–1.2)
Total Protein: 6.1 g/dL — ABNORMAL LOW (ref 6.5–8.1)

## 2023-05-03 LAB — I-STAT CG4 LACTIC ACID, ED: Lactic Acid, Venous: 0.6 mmol/L (ref 0.5–1.9)

## 2023-05-03 LAB — CBG MONITORING, ED: Glucose-Capillary: 94 mg/dL (ref 70–99)

## 2023-05-03 MED ORDER — SODIUM CHLORIDE 0.9 % IV SOLN: INTRAVENOUS | Status: DC

## 2023-05-03 MED ORDER — SODIUM CHLORIDE 0.9 % IV SOLN: Freq: Once | INTRAVENOUS | Status: AC

## 2023-05-03 NOTE — ED Provider Notes (Signed)
[facility] Provider Note   CSN: [id number] Arrival date & time: 05/03/23  1752     History  Chief Complaint  Patient presents with   Fever   Chills   Shortness of Breath    Virginia [name] is a 67 y.o. female.  67 year old female with prior medical history as detailed below presents for evaluation.  Patient with known malignancy.  Patient with increased weakness x 48 hours.  Patient unable to ambulate now despite assistance from family and use of walker.  Patient with upper respiratory congestion, cough, reported fevers at home.  The history is provided by the patient and medical records.       Home Medications Prior to Admission medications   Medication Sig Start Date End Date Taking? Authorizing Provider  acetaminophen (TYLENOL) 500 MG tablet Take 500 mg by mouth 2 (two) times daily as needed for moderate pain.    [provider]  albuterol (VENTOLIN HFA) 108 (90 Base) MCG/ACT inhaler INHALE 2 PUFFS INTO THE LUNGS EVERY 6 HOURS AS NEEDED FOR WHEEZING OR SHORTNESS OF BREATH 11/23/22   [name], MD  ALPRAZolam (XANAX) 0.25 MG tablet TAKE 1 TABLET(0.25 MG) BY MOUTH TWICE DAILY AS NEEDED FOR ANXIETY 03/20/23   Jaci Standard, MD  amLODipine (NORVASC) 10 MG tablet TAKE 1 TABLET BY MOUTH EVERY DAY 04/15/23   Jaci Standard, MD  Cyanocobalamin (VITAMIN B12 PO) Take 1 tablet by mouth daily. Gummies    [provider]  EPINEPHrine 0.3 mg/0.3 mL IJ SOAJ injection Inject 0.3 mg into the muscle as needed for anaphylaxis. 11/17/21   Darlin Drop, DO  folic acid (FOLVITE) 1 MG tablet TAKE 1 TABLET BY MOUTH DAILY 10/18/22 10/18/23  Jaci Standard, MD  furosemide (LASIX) 20 MG tablet Take 2 tablets (40 mg total) by mouth 2 times daily. 04/26/23   Georga Kaufmann T, [name]  gabapentin (NEURONTIN) 300 MG capsule Take 1 capsule (300 mg total) by mouth at bedtime. 01/31/23   Jaci Standard, MD  Glucosamine HCl (GLUCOSAMINE  PO) Take 2 tablets by mouth daily.    [provider]  LYSINE PO Take 1 tablet by mouth daily.    [provider]  meclizine (ANTIVERT) 25 MG tablet Take 1 tablet (25 mg total) by mouth 3 (three) times daily as needed for dizziness. 05/31/22   Henreitta Leber, MD  Multiple Vitamin (MULTIVITAMIN WITH MINERALS) TABS tablet Take 1 tablet by mouth daily.    [provider]  Multiple Vitamins-Minerals (AIRBORNE PO) Take 1 tablet by mouth daily.     [provider]  OLANZapine (ZYPREXA) 2.5 MG tablet TAKE 1 TABLET BY MOUTH AT BEDTIME 01/02/23   Ulysees Barns IV, MD  ondansetron (ZOFRAN) 4 MG tablet TAKE 1 TABLET(4 MG) BY MOUTH EVERY 8 HOURS AS NEEDED FOR NAUSEA OR VOMITING 01/30/23   Jaci Standard, MD  OVER THE COUNTER MEDICATION Take 2 tablets by mouth daily. Beet root    [provider]  oxyCODONE (OXY IR/ROXICODONE) 5 MG immediate release tablet TAKE 1 TO 2 TABLETS BY MOUTH EVERY 4 HOURS AS NEEDED FOR SEVERE PAIN 01/30/23   Jaci Standard, MD  oxyCODONE (OXY IR/ROXICODONE) 5 MG immediate release tablet Take 1 tablet (5 mg total) by mouth every 4 (four) hours as needed for severe pain. 04/04/23   Jaci Standard, MD  pantoprazole (PROTONIX) 40 MG tablet Take  1 tablet (40 mg total) by mouth daily. 09/05/22   [name], MD  polyethylene glycol (MIRALAX / GLYCOLAX) 17 g packet Take 17 g by mouth every evening.    [provider]  potassium chloride SA (KLOR-CON M) 20 MEQ tablet Take 2 tablets (40 mEq total) by mouth 2 (two) times daily. 03/08/23   Jaci Standard, MD  Tetrahydrozoline HCl (VISINE OP) Place 1 drop into both eyes daily as needed (irritation).    [provider]  VITAMIN D PO Take 1 capsule by mouth daily.    [provider]  vitamin E 180 MG (400 UNITS) capsule Take 400 IU daily x 1 week, then 400 IU BID Patient taking differently: Take 400 Units by mouth daily. 08/22/20   Lonie Peak, MD  XTAMPZA ER 13.5  MG C12A Take 1 capsule by mouth in the morning and at bedtime. (every 12 hours) 04/04/23 10/01/23  Jaci Standard, MD      Allergies    Patient has no known allergies.    Review of Systems   Review of Systems  All other systems reviewed and are negative.   Physical Exam Updated Vital Signs BP 112/78   Pulse (!) 103   Temp 98.3 F (36.8 C) (Oral)   Resp 16   SpO2 97%  Physical Exam Vitals and nursing note reviewed.  Constitutional:      General: She is not in acute distress.    Appearance: Normal appearance. She is well-developed.  HENT:     Head: Normocephalic and atraumatic.  Eyes:     Conjunctiva/sclera: Conjunctivae normal.     Pupils: Pupils are equal, round, and reactive to light.  Cardiovascular:     Rate and Rhythm: Normal rate and regular rhythm.     Heart sounds: Normal heart sounds.  Pulmonary:     Effort: Pulmonary effort is normal. No respiratory distress.     Breath sounds: Normal breath sounds.  Abdominal:     General: There is no distension.     Palpations: Abdomen is soft.     Tenderness: There is no abdominal tenderness.  Musculoskeletal:        General: No deformity. Normal range of motion.     Cervical back: Normal range of motion and neck supple.  Skin:    General: Skin is warm and dry.  Neurological:     General: No focal deficit present.     Mental Status: She is alert and oriented to person, place, and time.     ED Results / Procedures / Treatments   Labs (all labs ordered are listed, but only abnormal results are displayed) Labs Reviewed  CULTURE, BLOOD (ROUTINE X 2)  CULTURE, BLOOD (ROUTINE X 2)  RESP PANEL BY RT-PCR (RSV, FLU A&B, COVID)  RVPGX2  CBC WITH DIFFERENTIAL/PLATELET  COMPREHENSIVE METABOLIC PANEL  URINALYSIS, W/ REFLEX TO CULTURE (INFECTION SUSPECTED)  I-STAT CG4 LACTIC ACID, ED  CBG MONITORING, ED    EKG None  Radiology No results found.  Procedures Procedures    Medications Ordered in ED Medications -  No data to display  ED Course/ Medical Decision Making/ A&P                                 Medical Decision Making Amount and/or Complexity of Data Reviewed Labs: ordered. Radiology: ordered.  Risk Prescription drug management. Decision regarding hospitalization.    Medical  Screen Complete  This patient presented to the ED with complaint of weakness.  This complaint involves an extensive number of treatment options. The initial differential diagnosis includes, but is not limited to, metabolic abnormality, bacterial versus viral infection, etc  This presentation is: Acute, Chronic, Self-Limited, Previously Undiagnosed, Uncertain Prognosis, Complicated, Systemic Symptoms, and Threat to Life/Bodily Function  Patient is presenting with complaint of generalized weakness with current inability to ambulate.  Symptoms have worsened over the last 24 hours.  Workup demonstrates likely AKI and dehydration.  Patient would benefit from admission.  Hospitalist service made aware of case.  Additional history obtained: External records from outside sources obtained and reviewed including prior ED visits and prior Inpatient records.   Problem List / ED Course:  weakness   Reevaluation:  After the interventions noted above, I reevaluated the patient and found that they have: stayed the same  Disposition:  After consideration of the diagnostic results and the patients response to treatment, I feel that the patent would benefit from admission.          Final Clinical Impression(s) / ED Diagnoses Final diagnoses:  Weakness  AKI (acute kidney injury) [facility])    Rx / DC Orders ED Discharge Orders     None         Wynetta Fines, MD 05/03/23 2308

## 2023-05-03 NOTE — ED Triage Notes (Signed)
Patient is a cancer patient from home. She has had weakness, fever, chills and SOB. PET scan shows mets. MD requested she come here. She has chest pain with breathing for a week.   HX radiation necrosis, chemo patient  EMS vitals: 89-90% SPO2 on room, 94% SPO2 on 3L O2 nasal canula  94-110 HR 135 CBG 126/72 BP

## 2023-05-03 NOTE — H&P (Incomplete)
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  She additionally has HTN, HLD, COPD.  Patient has a history of radiation to the brain.  She is subsequently developed radiation necrosis, dizziness.  Additionally she has decreased control of her left limbs, and they will act on their own at times.  Per husband her right limbs recently started doing the same.  Her mouth will move independently.  Her dizziness which is chronic is increased and much worse.  She fell twice last week.  At baseline she will sleep 10 to 14 hours daily, now she is sleeping a lot more.  Her sleep is deeper, she is harder to arouse, and when aroused, she is harder to orient.  There is some intermittent confusion.  She is weaker, today her husband could hardly get her to stand while assisting her to the restroom.  She had to be instructed to move her foot with each step and she will take very small steps.  Her urine has been darker in color, this was reported to her primary doctor.  They will watch, this cleared but today her urine again is darker.  She endorses kidney pain she endorses generalized muscle aches and chest pain with deep inhalation.  She is on Keytruda every 3 weeks.  No chemotherapy or radiation.  Her husband brought her to the ER.  In the ER patient's intake vitals 112/78, 103, 16, afebrile.  Patient on 3 L oxygen satting 96%.  Creatinine 1.6, baseline normal.  Hemoglobin 8.2, baseline normal.  UA large hemoglobin, rare bacteria no leuks or nitrates. Blood cultures x 2, urine cultures collected.  CT head: Unchanged appearance of multiple cerebral and cerebellar metastasis. CXR cardiomegaly, no active disease  Review of Systems:  Per HPI  Past Medical History: Past Medical History:  Diagnosis Date   Anemia    Anxiety    Concussion 09/28/2019   DVT (deep venous thrombosis)  (HCC) 2021   L leg   Dyspnea    GERD (gastroesophageal reflux disease)    Hypercholesterolemia    per pt, she does not have elevated lipids   Hypertension    met lung ca dx'd 09/2019   mets to spine, hip and brain   PONV (postoperative nausea and vomiting)    Tobacco abuse    Past Surgical History:  Procedure Laterality Date   ABDOMINAL HYSTERECTOMY     partial/ left ovaries   APPLICATION OF CRANIAL NAVIGATION N/A 12/02/2020   Procedure: APPLICATION OF CRANIAL NAVIGATION;  Surgeon: Maeola Harman, MD;  Location: [facility] OR;  Service: Neurosurgery;  Laterality: N/A;   CHOLECYSTECTOMY     CRANIOTOMY N/A 12/02/2020   Procedure: Posterior fossa craniotomy for tumor resection with brainlab;  Surgeon: Maeola Harman, MD;  Location: [facility] OR;  Service: Neurosurgery;  Laterality: N/A;   DILATION AND CURETTAGE OF UTERUS     IR IMAGING GUIDED PORT INSERTION  10/23/2019   IR PATIENT EVAL TECH 0-60 MINS  11/21/2021   IR PATIENT EVAL TECH 0-60 MINS  11/24/2021   IR PATIENT EVAL TECH 0-60 MINS  11/29/2021   IR PATIENT EVAL TECH 0-60 MINS  12/04/2021   IR PATIENT EVAL TECH 0-60 MINS  12/12/2021   IR PATIENT EVAL TECH 0-60 MINS  12/19/2021   IR PATIENT EVAL TECH 0-60 MINS  12/27/2021   IR PATIENT EVAL TECH  0-60 MINS  01/03/2022   IR PATIENT EVAL TECH 0-60 MINS  01/10/2022   IR PATIENT EVAL TECH 0-60 MINS  01/18/2022   IR PATIENT EVAL TECH 0-60 MINS  02/13/2022   IR PATIENT EVAL TECH 0-60 MINS  02/20/2022   IR RADIOLOGIST EVAL & MGMT  01/23/2022   IR REMOVAL TUN ACCESS W/ PORT W/O FL MOD SED  11/17/2021   KYPHOPLASTY N/A 03/15/2020   Procedure: Thoracic Eight KYPHOPLASTY;  Surgeon: Maeola Harman, MD;  Location: [facility] OR;  Service: Neurosurgery;  Laterality: N/A;  prone    LIPOMA EXCISION  2018   removed under left breast and right thigh.   TUBAL LIGATION      Medications: Prior to Admission medications   Medication Sig Start Date End Date Taking? Authorizing Provider  acetaminophen (TYLENOL) 500 MG tablet Take 500 mg by  mouth 2 (two) times daily as needed for moderate pain.   Yes [provider]  albuterol (VENTOLIN HFA) 108 (90 Base) MCG/ACT inhaler INHALE 2 PUFFS INTO THE LUNGS EVERY 6 HOURS AS NEEDED FOR WHEEZING OR SHORTNESS OF BREATH 11/23/22  Yes Baxley, Luanna Cole, MD  ALPRAZolam (XANAX) 0.25 MG tablet TAKE 1 TABLET(0.25 MG) BY MOUTH TWICE DAILY AS NEEDED FOR ANXIETY 03/20/23  Yes Ulysees Barns IV, MD  amLODipine (NORVASC) 10 MG tablet TAKE 1 TABLET BY MOUTH EVERY DAY 04/15/23  Yes Jaci Standard, MD  Cyanocobalamin (VITAMIN B12 PO) Take 1 tablet by mouth daily. Gummies   Yes [provider]  EPINEPHrine 0.3 mg/0.3 mL IJ SOAJ injection Inject 0.3 mg into the muscle as needed for anaphylaxis. 11/17/21  Yes Hall, Oliver Pila, DO  folic acid (FOLVITE) 1 MG tablet TAKE 1 TABLET BY MOUTH DAILY 10/18/22 10/18/23 Yes Jaci Standard, MD  furosemide (LASIX) 20 MG tablet Take 2 tablets (40 mg total) by mouth 2 times daily. Patient taking differently: Take 40 mg by mouth in the morning. If ankles swells one in the evening 04/26/23  Yes Thayil, Irene T, [name]  Glucosamine HCl (GLUCOSAMINE PO) Take 2 tablets by mouth daily.   Yes [provider]  LYSINE PO Take 2 tablets by mouth daily.   Yes [provider]  meclizine (ANTIVERT) 25 MG tablet Take 1 tablet (25 mg total) by mouth 3 (three) times daily as needed for dizziness. 05/31/22  Yes Vaslow, Georgeanna Lea, MD  Multiple Vitamin (MULTIVITAMIN WITH MINERALS) TABS tablet Take 1 tablet by mouth daily.   Yes [provider]  Multiple Vitamins-Minerals (AIRBORNE PO) Take 1 tablet by mouth daily.    Yes [provider]  OLANZapine (ZYPREXA) 2.5 MG tablet TAKE 1 TABLET BY MOUTH AT BEDTIME 01/02/23  Yes Ulysees Barns IV, MD  ondansetron (ZOFRAN) 4 MG tablet TAKE 1 TABLET(4 MG) BY MOUTH EVERY 8 HOURS AS NEEDED FOR NAUSEA OR VOMITING 01/30/23  Yes Jaci Standard, MD  OVER THE COUNTER MEDICATION Take 2 tablets by mouth daily. Beet root    Yes [provider]  oxyCODONE (OXY IR/ROXICODONE) 5 MG immediate release tablet Take 1 tablet (5 mg total) by mouth every 4 (four) hours as needed for severe pain. 04/04/23  Yes Jaci Standard, MD  pantoprazole (PROTONIX) 40 MG tablet Take 1 tablet (40 mg total) by mouth daily. 09/05/22  Yes Baxley, Luanna Cole, MD  polyethylene glycol (MIRALAX / GLYCOLAX) 17 g packet Take 17 g by mouth every evening.   Yes [provider]  potassium chloride SA (  KLOR-CON M) 20 MEQ tablet Take 2 tablets (40 mEq total) by mouth 2 (two) times daily. 03/08/23  Yes Jaci Standard, MD  Tetrahydrozoline HCl (VISINE OP) Place 1 drop into both eyes daily as needed (irritation).   Yes [provider]  VITAMIN D PO Take 1 capsule by mouth every evening.   Yes [provider]  vitamin E 180 MG (400 UNITS) capsule Take 400 IU daily x 1 week, then 400 IU BID Patient taking differently: Take 400 Units by mouth daily. 08/22/20  Yes Lonie Peak, MD  XTAMPZA ER 13.5 MG C12A Take 1 capsule by mouth in the morning and at bedtime. (every 12 hours) 04/04/23 10/01/23 Yes Jaci Standard, MD  gabapentin (NEURONTIN) 300 MG capsule Take 1 capsule (300 mg total) by mouth at bedtime. Patient not taking: Reported on 05/03/2023 01/31/23   Jaci Standard, MD    Allergies:  No Known Allergies  Social History:  reports that she quit smoking about 10 years ago. Her smoking use included cigarettes. She has never used smokeless tobacco. She reports current alcohol use. She reports that she does not use drugs.  Family History: Family History  Problem Relation Age of Onset   Heart disease Father    Drug abuse Daughter    Drug abuse Son    Cancer Sister    Heart disease Brother    Heart attack Brother     Physical Exam: Vitals:   05/03/23 1856  BP: 112/78  Pulse: (!) 103  Resp: 16  Temp: 98.3 F (36.8 C)  TempSrc: Oral  SpO2: 97%    General: Alert and oriented but with occasional confusion  remarks, no acute distress Eyes: Pink conjunctiva, no scleral icterus ENT: Moist oral mucosa, neck supple, no thyromegaly Lungs: CTA, no wheeze, no crackles, no use of accessory muscles Cardiovascular: Tachycardia, RRR, no regurgitation, no gallops, no murmurs.  Some reproducible chest wall discomfort, mild Abdomen: soft, positive BS, non-tender, NTND, not an acute abdomen GU: not examined Neuro: CN II - XII grossly intact, sensation intact Musculoskeletal: strength 5/5 all extremities, no clubbing, cyanosis or edema Skin: Thickened of skin B/L lateral thighs Psych: appropriate patient   Labs on Admission:  Recent Labs    05/03/23 1922  NA 134*  K 4.2  CL 103  CO2 24  GLUCOSE 111*  BUN 16  CREATININE 1.60*  CALCIUM 7.8*   Recent Labs    05/03/23 1922  AST 20  ALT 13  ALKPHOS 54  BILITOT 0.6  PROT 6.1*  ALBUMIN 2.2*    Recent Labs    05/03/23 1922  WBC 4.3  NEUTROABS 2.7  HGB 8.2*  HCT 24.6*  MCV 102.9*  PLT 201    Micro Results: Recent Results (from the past 240 hour(s))  Urine Culture     Status: None   Collection Time: 05/01/23  2:10 PM   Specimen: Urine, Clean Catch  Result Value Ref Range Status   Specimen Description   Final    URINE, CLEAN CATCH Performed at [facility] at [facility], [address] [place], [place] [address]    Special Requests   Final    URINE, CLEAN CATCH Performed at [facility] at [facility], [address] [place], [place] [address]    Culture   Final    NO GROWTH Performed at [facility], [address], [place], [place] [address]  Report Status 05/02/2023 FINAL  Final     Radiological Exams on Admission: CT Head Wo Contrast  Result Date: 05/03/2023 CLINICAL DATA:  Altered mental status EXAM: CT HEAD WITHOUT CONTRAST TECHNIQUE: Contiguous axial images were obtained from the base of the skull through the vertex without intravenous contrast.  RADIATION DOSE REDUCTION: This exam was performed according to the departmental dose-optimization program which includes automated exposure control, adjustment of the mA and/or kV according to patient size and/or use of iterative reconstruction technique. COMPARISON:  12/01/2020 head CT Brain MRI 04/01/2023 FINDINGS: Brain: Large area of vasogenic edema within the right parietal lobe with a small area of calcification peripherally, unchanged from 04/01/2023. There are multiple other partially calcified lesions, most notably within the cerebellum that correspond to the previously demonstrated metastatic lesions. No acute hemorrhage or extra-axial collection. No midline shift or hydrocephalus. Vascular: Cavernous carotid calcification. Skull: Remote left occipital craniotomy Sinuses/Orbits: Negative Other: None IMPRESSION: Allowing for differences in modality, unchanged appearance of multiple cerebral and cerebellar metastases. Electronically Signed   By: Deatra Robinson M.D.   On: 05/03/2023 19:07   DG Chest Port 1 View  Result Date: 05/03/2023 CLINICAL DATA:  Shortness of breath, fever EXAM: PORTABLE CHEST 1 VIEW COMPARISON:  11/29/2021 FINDINGS: Cardiomegaly. Aortic atherosclerosis. No confluent airspace opacities or effusions. No edema. No acute bony abnormality. IMPRESSION: Cardiomegaly, no active disease. Electronically Signed   By: Charlett Nose M.D.   On: 05/03/2023 18:50    Assessment/Plan Present on Admission:  AKI (acute kidney injury) (HCC) -IV fluid hydration.  BMP in a.m.  Weakness -Likely due to anemia.  Likely symptomatic anemia -Transfusion ordered -PT consult placed  Symptomatic anemia -Likely secondary to hematuria -1 unit PRBC ordered -Posttransfusion H&H -Hemoccult -Anemia panel ordered. -MCV 100  Intermittent confusion/decreased ability to control right extremity -CT head without new or progressive abnormality -MRI brain ordered -No overt evidence of infection.  Blood  cultures x 2 ordered -Neurochecks  Hematuria -Without evidence of infection. -Urology consult placed.  Urologist contacted via epic -Serial H&H -Will order CT abdomen and pelvis with contrast in a.m. when creatinine improved after hydration.? mets -SCDs DVT prophylaxis  Intermittent encephalopathy -MRI brain ordered -Vitamin B12, TSH, ammonia level ordered -ESR, CRP  Hypoalbuminemia -Patient's albumin level steadily decreasing, 2.2 -01/10/2023 albumin 3.6.  Steadily decreased since -Protein powder ordered with meals   Cancer associated pain -Pain meds resumed.   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

## 2023-05-03 NOTE — ED Notes (Signed)
Pt transport to radiology for scans

## 2023-05-03 NOTE — Telephone Encounter (Signed)
-----   Message from Briant Cedar sent at 05/03/2023  7:17 AM EDT ----- Please notify patient that urine culture was negative for infection. ----- Message ----- From: Interface, Lab In [place] Sent: 05/01/2023   3:20 PM EDT To: Briant Cedar, [name]

## 2023-05-03 NOTE — ED Notes (Signed)
Pharmacy tech at bedside 

## 2023-05-03 NOTE — Telephone Encounter (Signed)
Randy left a message stating " I'm scared I'm losing her. She's fallen a couple of times this week, is confused in her speech. It's like her brain is not working, might be swelling. She's sleeping more and is having trouble coordinating her right limbs. I wondered if you have any input on how I can help her? " Also states she has been having pain when she takes a deep breath.   After talking with Dr [name] and Dr Barbaraann Cao, encouraged Virginia [name] to take her to the ED. He says he will try to talk her into going

## 2023-05-03 NOTE — Telephone Encounter (Signed)
Mr Mcvaugh advised of test results with [facility]

## 2023-05-04 ENCOUNTER — Inpatient Hospital Stay (HOSPITAL_COMMUNITY): Payer: Medicare Other

## 2023-05-04 DIAGNOSIS — N179 Acute kidney failure, unspecified: Secondary | ICD-10-CM | POA: Diagnosis not present

## 2023-05-04 LAB — RESP PANEL BY RT-PCR (RSV, FLU A&B, COVID)  RVPGX2
Influenza A by PCR: NEGATIVE
Influenza B by PCR: NEGATIVE
Resp Syncytial Virus by PCR: NEGATIVE
SARS Coronavirus 2 by RT PCR: NEGATIVE

## 2023-05-04 LAB — CBC WITH DIFFERENTIAL/PLATELET
Abs Immature Granulocytes: 0.02 10*3/uL (ref 0.00–0.07)
Basophils Absolute: 0 10*3/uL (ref 0.0–0.1)
Basophils Relative: 1 %
Eosinophils Absolute: 0.1 10*3/uL (ref 0.0–0.5)
Eosinophils Relative: 1 %
HCT: 26.6 % — ABNORMAL LOW (ref 36.0–46.0)
Hemoglobin: 8.5 g/dL — ABNORMAL LOW (ref 12.0–15.0)
Immature Granulocytes: 1 %
Lymphocytes Relative: 23 %
Lymphs Abs: 0.9 10*3/uL (ref 0.7–4.0)
MCH: 33.7 pg (ref 26.0–34.0)
MCHC: 32 g/dL (ref 30.0–36.0)
MCV: 105.6 fL — ABNORMAL HIGH (ref 80.0–100.0)
Monocytes Absolute: 0.6 10*3/uL (ref 0.1–1.0)
Monocytes Relative: 17 %
Neutro Abs: 2.1 10*3/uL (ref 1.7–7.7)
Neutrophils Relative %: 57 %
Platelets: 147 10*3/uL — ABNORMAL LOW (ref 150–400)
RBC: 2.52 MIL/uL — ABNORMAL LOW (ref 3.87–5.11)
RDW: 13.2 % (ref 11.5–15.5)
WBC: 3.7 10*3/uL — ABNORMAL LOW (ref 4.0–10.5)
nRBC: 0 % (ref 0.0–0.2)

## 2023-05-04 LAB — CBC
HCT: 24.6 % — ABNORMAL LOW (ref 36.0–46.0)
HCT: 29 % — ABNORMAL LOW (ref 36.0–46.0)
Hemoglobin: 8 g/dL — ABNORMAL LOW (ref 12.0–15.0)
Hemoglobin: 9.8 g/dL — ABNORMAL LOW (ref 12.0–15.0)
MCH: 33.8 pg (ref 26.0–34.0)
MCH: 33 pg (ref 26.0–34.0)
MCHC: 32.5 g/dL (ref 30.0–36.0)
MCHC: 33.8 g/dL (ref 30.0–36.0)
MCV: 103.8 fL — ABNORMAL HIGH (ref 80.0–100.0)
MCV: 97.6 fL (ref 80.0–100.0)
Platelets: 140 10*3/uL — ABNORMAL LOW (ref 150–400)
Platelets: 157 10*3/uL (ref 150–400)
RBC: 2.37 MIL/uL — ABNORMAL LOW (ref 3.87–5.11)
RBC: 2.97 MIL/uL — ABNORMAL LOW (ref 3.87–5.11)
RDW: 13.2 % (ref 11.5–15.5)
RDW: 15 % (ref 11.5–15.5)
WBC: 4 10*3/uL (ref 4.0–10.5)
WBC: 4.3 10*3/uL (ref 4.0–10.5)
nRBC: 0 % (ref 0.0–0.2)
nRBC: 0 % (ref 0.0–0.2)

## 2023-05-04 LAB — URINALYSIS, W/ REFLEX TO CULTURE (INFECTION SUSPECTED)
Bilirubin Urine: NEGATIVE
Glucose, UA: NEGATIVE mg/dL
Ketones, ur: NEGATIVE mg/dL
Leukocytes,Ua: NEGATIVE
Nitrite: NEGATIVE
Protein, ur: 100 mg/dL — AB
RBC / HPF: >50 RBC/hpf (ref 0–5)
Specific Gravity, Urine: 1.01 (ref 1.005–1.030)
pH: 6 (ref 5.0–8.0)

## 2023-05-04 LAB — BASIC METABOLIC PANEL
Anion gap: 9 (ref 5–15)
BUN: 12 mg/dL (ref 8–23)
CO2: 20 mmol/L — ABNORMAL LOW (ref 22–32)
Calcium: 8 mg/dL — ABNORMAL LOW (ref 8.9–10.3)
Chloride: 107 mmol/L (ref 98–111)
Creatinine, Ser: 1.32 mg/dL — ABNORMAL HIGH (ref 0.44–1.00)
GFR, Estimated: 45 mL/min — ABNORMAL LOW (ref >60)
Glucose, Bld: 95 mg/dL (ref 70–99)
Potassium: 3 mmol/L — ABNORMAL LOW (ref 3.5–5.1)
Sodium: 136 mmol/L (ref 135–145)

## 2023-05-04 LAB — IRON AND TIBC
Iron: 61 ug/dL (ref 28–170)
Saturation Ratios: 33 % — ABNORMAL HIGH (ref 10.4–31.8)
TIBC: 183 ug/dL — ABNORMAL LOW (ref 250–450)
UIBC: 122 ug/dL

## 2023-05-04 LAB — URINALYSIS, ROUTINE W REFLEX MICROSCOPIC
Bilirubin Urine: NEGATIVE
Glucose, UA: NEGATIVE mg/dL
Ketones, ur: 5 mg/dL — AB
Nitrite: NEGATIVE
Protein, ur: 100 mg/dL — AB
RBC / HPF: >50 RBC/hpf (ref 0–5)
Specific Gravity, Urine: 1.006 (ref 1.005–1.030)
pH: 6 (ref 5.0–8.0)

## 2023-05-04 LAB — FOLATE: Folate: 12 ng/mL (ref >5.9)

## 2023-05-04 LAB — RETICULOCYTES
Immature Retic Fract: 5.3 % (ref 2.3–15.9)
RBC.: 2.54 MIL/uL — ABNORMAL LOW (ref 3.87–5.11)
Retic Ct Pct: <0.4 % — ABNORMAL LOW (ref 0.4–3.1)

## 2023-05-04 LAB — AMMONIA: Ammonia: 17 umol/L (ref 9–35)

## 2023-05-04 LAB — VITAMIN B12: Vitamin B-12: 3189 pg/mL — ABNORMAL HIGH (ref 180–914)

## 2023-05-04 LAB — CREATININE, SERUM
Creatinine, Ser: 1.42 mg/dL — ABNORMAL HIGH (ref 0.44–1.00)
GFR, Estimated: 41 mL/min — ABNORMAL LOW (ref >60)

## 2023-05-04 LAB — TSH: TSH: 0.138 u[IU]/mL — ABNORMAL LOW (ref 0.350–4.500)

## 2023-05-04 LAB — FERRITIN: Ferritin: 1250 ng/mL — ABNORMAL HIGH (ref 11–307)

## 2023-05-04 LAB — PREPARE RBC (CROSSMATCH)

## 2023-05-04 MED ORDER — AMLODIPINE BESYLATE 10 MG PO TABS
10.0000 mg | ORAL_TABLET | Freq: Every day | ORAL | Status: DC
  Administered 2023-05-04 – 2023-05-09 (×6): 10 mg via ORAL
  Filled 2023-05-04 (×6): qty 1

## 2023-05-04 MED ORDER — ACETAMINOPHEN 650 MG RE SUPP: 650.0000 mg | Freq: Four times a day (QID) | RECTAL | Status: DC | PRN

## 2023-05-04 MED ORDER — NAPHAZOLINE-PHENIRAMINE 0.025-0.3 % OP SOLN: 1.0000 [drp] | Freq: Four times a day (QID) | OPHTHALMIC | Status: DC | PRN

## 2023-05-04 MED ORDER — SENNOSIDES-DOCUSATE SODIUM 8.6-50 MG PO TABS: 1.0000 | ORAL_TABLET | Freq: Every evening | ORAL | Status: DC | PRN

## 2023-05-04 MED ORDER — FOLIC ACID 1 MG PO TABS
1.0000 mg | ORAL_TABLET | Freq: Every day | ORAL | Status: DC
  Administered 2023-05-04 – 2023-05-11 (×8): 1 mg via ORAL
  Filled 2023-05-04 (×8): qty 1

## 2023-05-04 MED ORDER — OXYCODONE HCL 5 MG PO TABS
5.0000 mg | ORAL_TABLET | ORAL | Status: DC | PRN
  Administered 2023-05-05 – 2023-05-11 (×9): 5 mg via ORAL
  Filled 2023-05-04 (×9): qty 1

## 2023-05-04 MED ORDER — LORAZEPAM 2 MG/ML IJ SOLN: 1.0000 mg | Freq: Once | INTRAMUSCULAR | Status: DC

## 2023-05-04 MED ORDER — HEPARIN SODIUM (PORCINE) 5000 UNIT/ML IJ SOLN: 5000.0000 [IU] | Freq: Three times a day (TID) | INTRAMUSCULAR | Status: DC

## 2023-05-04 MED ORDER — LORAZEPAM 2 MG/ML IJ SOLN: 0.5000 mg | Freq: Once | INTRAMUSCULAR | Status: DC

## 2023-05-04 MED ORDER — BENEPROTEIN PO POWD
1.0000 | Freq: Three times a day (TID) | ORAL | Status: DC
  Filled 2023-05-04 (×2): qty 227

## 2023-05-04 MED ORDER — MECLIZINE HCL 25 MG PO TABS
25.0000 mg | ORAL_TABLET | Freq: Three times a day (TID) | ORAL | Status: DC | PRN
  Administered 2023-05-09 – 2023-05-11 (×3): 25 mg via ORAL
  Filled 2023-05-04 (×3): qty 1

## 2023-05-04 MED ORDER — PANTOPRAZOLE SODIUM 40 MG PO TBEC
40.0000 mg | DELAYED_RELEASE_TABLET | Freq: Every day | ORAL | Status: DC
  Administered 2023-05-04 – 2023-05-11 (×8): 40 mg via ORAL
  Filled 2023-05-04 (×8): qty 1

## 2023-05-04 MED ORDER — ALPRAZOLAM 0.25 MG PO TABS
0.2500 mg | ORAL_TABLET | Freq: Two times a day (BID) | ORAL | Status: DC | PRN
  Administered 2023-05-04: 0.25 mg via ORAL
  Filled 2023-05-04: qty 1

## 2023-05-04 MED ORDER — SODIUM CHLORIDE 0.9% IV SOLUTION: Freq: Once | INTRAVENOUS | Status: DC

## 2023-05-04 MED ORDER — ENSURE ENLIVE PO LIQD
237.0000 mL | Freq: Two times a day (BID) | ORAL | Status: DC
  Administered 2023-05-06 – 2023-05-10 (×9): 237 mL via ORAL

## 2023-05-04 MED ORDER — OLANZAPINE 5 MG PO TABS
2.5000 mg | ORAL_TABLET | Freq: Every day | ORAL | Status: DC
  Administered 2023-05-04 – 2023-05-08 (×6): 2.5 mg via ORAL
  Filled 2023-05-04 (×6): qty 1

## 2023-05-04 MED ORDER — SODIUM CHLORIDE 0.9 % IV BOLUS
500.0000 mL | Freq: Once | INTRAVENOUS | Status: AC
  Administered 2023-05-04: 500 mL via INTRAVENOUS

## 2023-05-04 MED ORDER — OXYCODONE HCL ER 10 MG PO T12A
10.0000 mg | EXTENDED_RELEASE_TABLET | Freq: Two times a day (BID) | ORAL | Status: DC
  Administered 2023-05-04 – 2023-05-11 (×14): 10 mg via ORAL
  Filled 2023-05-04 (×15): qty 1

## 2023-05-04 MED ORDER — SODIUM CHLORIDE 0.9 % IV SOLN: INTRAVENOUS | Status: DC

## 2023-05-04 MED ORDER — ACETAMINOPHEN 325 MG PO TABS
650.0000 mg | ORAL_TABLET | Freq: Four times a day (QID) | ORAL | Status: DC | PRN
  Administered 2023-05-06 – 2023-05-07 (×2): 650 mg via ORAL
  Filled 2023-05-04 (×3): qty 2

## 2023-05-04 NOTE — Plan of Care (Signed)

## 2023-05-04 NOTE — Progress Notes (Signed)
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  Patient has history of radiation to the brain. She subsequently developed radiation necrosis and dizziness.  She has decreased control of her left sided extremities, they will act on their own.  She has developed same movements on the right extremities.  Her dizziness is chronic and has increased in severity.  Due to unsteadiness she has fallen twice last week.  At baseline she sleeps 10 to 14 hours daily now she is sleeping a lot more.  Her sleep is deeper and she is hard to arouse and when aroused she is hard to orient.  There is more intermittent confusion.  She has worsening weakness today,  her husband could hardly get her to the stand  while assisting her to the restroom.  Patient also endorses generalized muscle ache,  chest pain with deep inhalation.  She is on Keytruda every 3 weeks.  Workup in the ED CT head unchanged multiple cerebral and cerebellar metastasis.  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 Baseline serum creatinine 0.86 presented with creatinine 1.6. IV fluid hydration.  Monitor serum creatinine.  Avoid nephrotoxic medications.   Generalized weakness: > multifactorial Likely due to anemia.  Likely symptomatic anemia. Baseline hemoglobin 12.9,  dropped to 8.2. Patient has mild hematuria. Transfuse 1 unit PRBC. PT and OT evaluation   Symptomatic anemia Likely secondary to hematuria Transfuse 1 unit PRBC.   Follow-up posttransfusion H&H Check Hemoccult  Intermittent confusion/decreased ability to control right extremity CT head without new or progressive abnormality MRI brain: No acute findings superimposed on patient's cerebral metastatic disease. No overt evidence of infection.  Blood cultures x 2 ordered Neuro checks   Hematuria: Without evidence of infection. Urology consulted. Serial H&H order CT A/P with contrast in a.m. when creatinine improved after hydration. SCDs DVT prophylaxis   Intermittent encephalopathy MRI brain no acute findings superimposed on patient's cerebral metastatic disease.  Mild to moderate increase in vasogenic edema centered on right parietal region.  No hydrocephalus or shift. Check Vitamin B12, TSH, ammonia level ordered -ESR, CRP   Hypoalbuminemia -Patient's albumin level steadily decreasing, 2.2 -01/10/2023 albumin 3.6.  Steadily decreased since -Protein powder ordered with meals   Cancer associated pain Continue home pain medications.   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   Hypertension Continue Norvasc    Anxiety Continue Xanax, Zyprexa.   DVT prophylaxis: SCDs Code Status: Full code Family Communication: No family at bed side. Disposition Plan:    Status is: Inpatient Remains inpatient appropriate because: Admitted for multiple problems.  Hematuria, metabolic encephalopathy AKI.    Consultants:  None  Procedures: None  Antimicrobials: Anti-infectives (From admission, onward)    None  Subjective: Patient was seen and examined at bedside. Overnight events noted.   Patient reports doing better. She is getting blood transfusion because of  hematuria.   Objective: Vitals:   05/04/23 1043 05/04/23 1110 05/04/23 1220 05/04/23 1253  BP: (!) 129/43  95/68 113/79  Pulse: (!) 127 98 82 (!) 128  Resp: 16  16 12   Temp: 98.3 F (36.8 C)  98.3 F (36.8 C) 98.2 F (36.8 C)  TempSrc: Oral  Oral Oral  SpO2: 96%  95% 96%    Intake/Output Summary (Last 24 hours) at 05/04/2023 1309 Last data filed at 05/04/2023 1300 Gross per 24 hour  Intake 240 ml  Output 200 ml  Net 40 ml   There were no vitals filed for this visit.  Examination:  General exam: Appears calm and comfortable, deconditioned, not in any acute distress Respiratory system: Clear to auscultation. Respiratory effort normal.  RR 16 Cardiovascular system: S1 & S2 heard, RRR. No JVD, murmurs, rubs, gallops or clicks. No pedal edema. Gastrointestinal system: Abdomen is nondistended, soft and nontender.. Normal bowel sounds heard. Central nervous system: Alert and oriented x2. No focal neurological deficits. Extremities: Symmetric 5 x 5 power. Skin: No rashes, lesions or ulcers Psychiatry: Judgement and insight appear normal. Mood & affect appropriate.     Data Reviewed: I have personally reviewed following labs and imaging studies  CBC: Recent Labs  Lab 05/03/23 1922 05/04/23 0248 05/04/23 0917  WBC 4.3 4.0 3.7*  NEUTROABS 2.7  --  2.1  HGB 8.2* 8.0* 8.5*  HCT 24.6* 24.6* 26.6*  MCV 102.9* 103.8* 105.6*  PLT 201 140* 147*   Basic Metabolic Panel: Recent Labs  Lab 05/03/23 1922 05/04/23 0917  NA 134* 136  K 4.2 3.0*  CL 103 107  CO2 24 20*  GLUCOSE 111* 95  BUN 16 12  CREATININE 1.60* 1.32*  CALCIUM 7.8* 8.0*   GFR: Estimated Creatinine Clearance: 37.8 mL/min (A) (by C-G formula based on SCr of 1.32 mg/dL (H)). Liver Function Tests: Recent Labs  Lab 05/03/23 1922  AST 20  ALT 13  ALKPHOS 54  BILITOT 0.6  PROT 6.1*  ALBUMIN 2.2*   No results for input(s): "LIPASE", "AMYLASE" in the last 168 hours. Recent Labs  Lab 05/04/23 0917   AMMONIA 17   Coagulation Profile: No results for input(s): "INR", "PROTIME" in the last 168 hours. Cardiac Enzymes: No results for input(s): "CKTOTAL", "CKMB", "CKMBINDEX", "TROPONINI" in the last 168 hours. BNP (last 3 results) No results for input(s): "PROBNP" in the last 8760 hours. HbA1C: No results for input(s): "HGBA1C" in the last 72 hours. CBG: Recent Labs  Lab 05/03/23 2115  GLUCAP 94   Lipid Profile: No results for input(s): "CHOL", "HDL", "LDLCALC", "TRIG", "CHOLHDL", "LDLDIRECT" in the last 72 hours. Thyroid Function Tests: Recent Labs    05/04/23 0922  TSH 0.138*   Anemia Panel: Recent Labs    05/04/23 0917 05/04/23 0922  VITAMINB12 3,189*  --   FOLATE  --  12.0  FERRITIN 1,250*  --   TIBC 183*  --   IRON 61  --   RETICCTPCT  --  <0.4*   Sepsis Labs: Recent Labs  Lab 05/03/23 1930  LATICACIDVEN 0.6    Recent Results (from the past 240 hour(s))  Urine Culture     Status: None   Collection Time: 05/01/23  2:10 PM   Specimen: Urine, Clean Catch  Result Value Ref Range Status   Specimen Description   Final  URINE, CLEAN CATCH Performed at [facility] at [facility], [address] [place], [place] [address]    Special Requests   Final    URINE, CLEAN CATCH Performed at [facility] at [facility], [address] [place], [place] [address]    Culture   Final    NO GROWTH Performed at [facility], [address], [place], [place] [address]    Report Status 05/02/2023 FINAL  Final  Resp panel by RT-PCR (RSV, Flu A&B, Covid) Anterior Nasal Swab     Status: None   Collection Time: 05/03/23  6:19 PM   Specimen: Anterior Nasal Swab  Result Value Ref Range Status   SARS Coronavirus 2 by RT PCR NEGATIVE NEGATIVE Final    Comment: (NOTE) SARS-CoV-2 target nucleic acids are NOT DETECTED.  The SARS-CoV-2 RNA is generally detectable in upper respiratory specimens during the acute  phase of infection. The lowest concentration of SARS-CoV-2 viral copies this assay can detect is 138 copies/mL. A negative result does not preclude SARS-Cov-2 infection and should not be used as the sole basis for treatment or other patient management decisions. A negative result may occur with  improper specimen collection/handling, submission of specimen other than nasopharyngeal swab, presence of viral mutation(s) within the areas targeted by this assay, and inadequate number of viral copies(<138 copies/mL). A negative result must be combined with clinical observations, patient history, and epidemiological information. The expected result is Negative.  Fact Sheet for Patients:  BloggerCourse.com  Fact Sheet for Healthcare Providers:  SeriousBroker.it  This test is no t yet approved or cleared by the [place] FDA and  has been authorized for detection and/or diagnosis of SARS-CoV-2 by FDA under an Emergency Use Authorization (EUA). This EUA will remain  in effect (meaning this test can be used) for the duration of the COVID-19 declaration under Section 564(b)(1) of the Act, 21 U.S.C.section 360bbb-3(b)(1), unless the authorization is terminated  or revoked sooner.       Influenza A by PCR NEGATIVE NEGATIVE Final   Influenza B by PCR NEGATIVE NEGATIVE Final    Comment: (NOTE) The Xpert Xpress SARS-CoV-2/FLU/RSV plus assay is intended as an aid in the diagnosis of influenza from Nasopharyngeal swab specimens and should not be used as a sole basis for treatment. Nasal washings and aspirates are unacceptable for Xpert Xpress SARS-CoV-2/FLU/RSV testing.  Fact Sheet for Patients: BloggerCourse.com  Fact Sheet for Healthcare Providers: SeriousBroker.it  This test is not yet approved or cleared by the [place] FDA and has been authorized for detection and/or diagnosis of  SARS-CoV-2 by FDA under an Emergency Use Authorization (EUA). This EUA will remain in effect (meaning this test can be used) for the duration of the COVID-19 declaration under Section 564(b)(1) of the Act, 21 U.S.C. section 360bbb-3(b)(1), unless the authorization is terminated or revoked.     Resp Syncytial Virus by PCR NEGATIVE NEGATIVE Final    Comment: (NOTE) Fact Sheet for Patients: BloggerCourse.com  Fact Sheet for Healthcare Providers: SeriousBroker.it  This test is not yet approved or cleared by the [place] FDA and has been authorized for detection and/or diagnosis of SARS-CoV-2 by FDA under an Emergency Use Authorization (EUA). This EUA will remain in effect (meaning this test can be used) for the duration of the COVID-19 declaration under Section 564(b)(1) of the Act, 21 U.S.C. section 360bbb-3(b)(1), unless the authorization is terminated or revoked.  Performed at [facility], [address] [place], [place] [address]   Culture, blood (routine x 2)     Status: None (Preliminary result)   Collection Time: 05/03/23  9:06 PM   Specimen: BLOOD LEFT FOREARM  Result Value Ref Range Status   Specimen Description   Final    BLOOD LEFT FOREARM Performed at [facility], [address], [place], [place] [address]    Special Requests   Final    BOTTLES DRAWN AEROBIC AND ANAEROBIC Blood Culture adequate volume Performed at [facility], [address], [place], [place] [address]    Culture   Final    NO GROWTH < 12 HOURS Performed at [facility], [address], [place], [place] [address]    Report Status PENDING  Incomplete    Radiology Studies: MR BRAIN WO CONTRAST  Result Date: 05/04/2023 CLINICAL DATA:  Brain metastases with neurologic changes. EXAM: MRI HEAD WITHOUT CONTRAST TECHNIQUE: Multiplanar, multiecho pulse sequences of the brain and  surrounding structures were obtained without intravenous contrast. COMPARISON:  04/01/2023 FINDINGS: Brain: Known metastatic disease, underestimated relative to prior brain MRI in the absence of IV contrast. Fairly extensive vasogenic edema in the bilateral cerebellum, especially in the mid and superior portions. There has been a mild increase in edema at the right parietal lesion, although no increase in mass effect. Borderline increased vasogenic edema in the anterior left frontal lobe. No indication of acute hemorrhage, hydrocephalus, or collection. Vascular: Major flow voids are preserved Skull and upper cervical spine: Curved left occipital craniotomy site is unremarkable. Sinuses/Orbits: Negative. IMPRESSION: 1. No acute finding superimposed on the patient's cerebral metastatic disease. 2. Mild-to-moderate increase in vasogenic edema centered on a right parietal lesion. Fairly extensive cerebellar edema is unchanged. No hydrocephalus or shift. Electronically Signed   By: Tiburcio Pea M.D.   On: 05/04/2023 12:03   CT Head Wo Contrast  Result Date: 05/03/2023 CLINICAL DATA:  Altered mental status EXAM: CT HEAD WITHOUT CONTRAST TECHNIQUE: Contiguous axial images were obtained from the base of the skull through the vertex without intravenous contrast. RADIATION DOSE REDUCTION: This exam was performed according to the departmental dose-optimization program which includes automated exposure control, adjustment of the mA and/or kV according to patient size and/or use of iterative reconstruction technique. COMPARISON:  12/01/2020 head CT Brain MRI 04/01/2023 FINDINGS: Brain: Large area of vasogenic edema within the right parietal lobe with a small area of calcification peripherally, unchanged from 04/01/2023. There are multiple other partially calcified lesions, most notably within the cerebellum that correspond to the previously demonstrated metastatic lesions. No acute hemorrhage or extra-axial collection. No  midline shift or hydrocephalus. Vascular: Cavernous carotid calcification. Skull: Remote left occipital craniotomy Sinuses/Orbits: Negative Other: None IMPRESSION: Allowing for differences in modality, unchanged appearance of multiple cerebral and cerebellar metastases. Electronically Signed   By: Deatra Robinson M.D.   On: 05/03/2023 19:07   DG Chest Port 1 View  Result Date: 05/03/2023 CLINICAL DATA:  Shortness of breath, fever EXAM: PORTABLE CHEST 1 VIEW COMPARISON:  11/29/2021 FINDINGS: Cardiomegaly. Aortic atherosclerosis. No confluent airspace opacities or effusions. No edema. No acute bony abnormality. IMPRESSION: Cardiomegaly, no active disease. Electronically Signed   By: Charlett Nose M.D.   On: 05/03/2023 18:50    Scheduled Meds:  sodium chloride   Intravenous Once   amLODipine  10 mg Oral Daily   feeding supplement  237 mL Oral BID BM   folic acid  1 mg Oral Daily  OLANZapine  2.5 mg Oral QHS   oxyCODONE  10 mg Oral Q12H   pantoprazole  40 mg Oral Daily   protein supplement  1 Scoop Oral TID WC   Continuous Infusions:  sodium chloride 125 mL/hr at 05/04/23 0330     LOS: 1 day    Time spent: 50 mins    Willeen Niece, MD [facility]   If 7PM-7AM, please contact night-coverage

## 2023-05-04 NOTE — Evaluation (Signed)
Physical Therapy Evaluation Patient Details Name: Virginia [name] MRN: [id number] DOB: 07/06/1956 Today's Date: 05/04/2023  History of Present Illness  67 yo female admitted with AKI, metabolic encephalopathy, weakness. Hx of NSCLC with mets, COPD, radiation necrosis, chronic dizziness. anemia, falls.  Clinical Impression  Bed level eval only on today. Was able to assess patient's UE/LEs with time and repeated cueing. Pt had difficulty keeping eyes open during session. Deferred mobility for safety reasons. No family present on today. Nursing reported difficulty mobilizing pt to [facility]. Will plan to follow and progress activity as tolerated. Patient will benefit from continued inpatient follow up therapy, <3 hours/day         If plan is discharge home, recommend the following:     Can travel by private vehicle        Equipment Recommendations None recommended by PT  Recommendations for Other Services       Functional Status Assessment Patient has had a recent decline in their functional status and demonstrates the ability to make significant improvements in function in a reasonable and predictable amount of time.     Precautions / Restrictions Precautions Precautions: Fall Restrictions Weight Bearing Restrictions: No      Mobility  Bed Mobility               General bed mobility comments: Deferred on today-pt having difficulty keeping eyes open. Was able to assess UEs/LEs.    Transfers                        Ambulation/Gait                  Stairs            Wheelchair Mobility     Tilt Bed    Modified Rankin (Stroke Patients Only)       Balance                                             Pertinent Vitals/Pain Pain Assessment Pain Assessment: Faces Faces Pain Scale: No hurt    Home Living Family/patient expects to be discharged to:: Private residence Living Arrangements: Spouse/significant  other Available Help at Discharge: Family Type of Home: House Home Access: Stairs to enter Entrance Stairs-Rails: Left Entrance Stairs-Number of Steps: 5   Home Layout: Two level;Bed/bath upstairs Home Equipment: Rollator (4 wheels);Wheelchair - manual      Prior Function Prior Level of Function : Needs assist             Mobility Comments: uses walker for ambulation       Extremity/Trunk Assessment   Upper Extremity Assessment Upper Extremity Assessment: Defer to OT evaluation    Lower Extremity Assessment Lower Extremity Assessment:  (Strength at least 3/5 throughout)       Communication   Communication Communication: No apparent difficulties  Cognition     Overall Cognitive Status: Difficult to assess                                          General Comments      Exercises     Assessment/Plan    PT Assessment Patient needs continued PT services  PT Problem List Decreased strength;Decreased range of motion;Decreased  activity tolerance;Decreased balance;Decreased mobility;Decreased coordination;Decreased cognition;Decreased knowledge of use of DME;Decreased safety awareness       PT Treatment Interventions DME instruction;Gait training;Balance training;Stair training;Functional mobility training;Therapeutic activities;Therapeutic exercise;Patient/family education    PT Goals (Current goals can be found in the Care Plan section)  Acute Rehab PT Goals Patient Stated Goal: none stated by pat PT Goal Formulation: Patient unable to participate in goal setting Time For Goal Achievement: 05/18/23 Potential to Achieve Goals: Fair    Frequency Min 1X/week     Co-evaluation               AM-PAC PT "6 Clicks" Mobility  Outcome Measure Help needed turning from your back to your side while in a flat bed without using bedrails?: Total Help needed moving from lying on your back to sitting on the side of a flat bed without using  bedrails?: Total Help needed moving to and from a bed to a chair (including a wheelchair)?: Total Help needed standing up from a chair using your arms (e.g., wheelchair or bedside chair)?: Total Help needed to walk in hospital room?: Total Help needed climbing 3-5 steps with a railing? : Total 6 Click Score: 6    End of Session   Activity Tolerance: Patient tolerated treatment well Patient left: in bed;with call bell/phone within reach;with bed alarm set   PT Visit Diagnosis: Muscle weakness (generalized) (M62.81);Difficulty in walking, not elsewhere classified (R26.2)    Time: 4132-4401 PT Time Calculation (min) (ACUTE ONLY): 8 min   Charges:   PT Evaluation $PT Eval Low Complexity: 1 Low   PT General Charges $$ ACUTE PT VISIT: 1 Visit            Faye Ramsay, PT Acute Rehabilitation  Office: [phone]

## 2023-05-05 DIAGNOSIS — N179 Acute kidney failure, unspecified: Secondary | ICD-10-CM

## 2023-05-05 LAB — BASIC METABOLIC PANEL
Anion gap: 5 (ref 5–15)
BUN: 13 mg/dL (ref 8–23)
CO2: 24 mmol/L (ref 22–32)
Calcium: 7.3 mg/dL — ABNORMAL LOW (ref 8.9–10.3)
Chloride: 107 mmol/L (ref 98–111)
Creatinine, Ser: 1.44 mg/dL — ABNORMAL HIGH (ref 0.44–1.00)
GFR, Estimated: 40 mL/min — ABNORMAL LOW (ref >60)
Glucose, Bld: 129 mg/dL — ABNORMAL HIGH (ref 70–99)
Potassium: 3.2 mmol/L — ABNORMAL LOW (ref 3.5–5.1)
Sodium: 136 mmol/L (ref 135–145)

## 2023-05-05 LAB — URINE CULTURE: Culture: <10000 — AB

## 2023-05-05 NOTE — Evaluation (Signed)
Occupational Therapy Evaluation Patient Details Name: Virginia [name] MRN: [id number] DOB: 1955-12-14 Today's Date: 05/05/2023   History of Present Illness 67 yo female admitted with AKI, metabolic encephalopathy, weakness. Hx of NSCLC with mets, COPD, radiation necrosis, chronic dizziness. anemia, falls.   Clinical Impression   PTA, pt lives with spouse, typically ambulatory with RW vs Rollator and able to manage ADLs. Pt with recent increased assist needed for stair mgmt in their two-level home. Pt presents now with deficits in cognition, standing balance, strength and coordination (LUE and BLE on eval). Pt requires up to Mod A for BSC transfers with RW, Min A for UB ADL and Mod A for LB ADLs. Based on higher PLOF, access to 24/7 assist at home and current fall risk, recommend consideration of intensive rehab services at DC.      If plan is discharge home, recommend the following: A lot of help with walking and/or transfers;Two people to help with walking and/or transfers;A lot of help with bathing/dressing/bathroom;Help with stairs or ramp for entrance;Direct supervision/assist for medications management;Direct supervision/assist for financial management    Functional Status Assessment  Patient has had a recent decline in their functional status and demonstrates the ability to make significant improvements in function in a reasonable and predictable amount of time.  Equipment Recommendations  None recommended by OT    Recommendations for Other Services Rehab consult     Precautions / Restrictions Precautions Precautions: Fall Restrictions Weight Bearing Restrictions: No      Mobility Bed Mobility Overal bed mobility: Needs Assistance Bed Mobility: Supine to Sit, Sit to Supine     Supine to sit: Min assist, HOB elevated Sit to supine: Min assist        Transfers Overall transfer level: Needs assistance Equipment used: Rolling walker (2 wheels) Transfers: Sit to/from  Stand, Bed to chair/wheelchair/BSC Sit to Stand: Min assist     Step pivot transfers: Mod assist     General transfer comment: Cues for hand placement as pt tending to pull on RW with wheels coming off of floor. assist for DME, BSC stabilization and balance to/from [facility]      Balance Overall balance assessment: Needs assistance Sitting-balance support: No upper extremity supported, Feet supported Sitting balance-Leahy Scale: Fair     Standing balance support: Bilateral upper extremity supported, During functional activity Standing balance-Leahy Scale: Poor                             ADL either performed or assessed with clinical judgement   ADL Overall ADL's : Needs assistance/impaired Eating/Feeding: Set up   Grooming: Minimal assistance;Standing   Upper Body Bathing: Minimal assistance;Sitting   Lower Body Bathing: Moderate assistance;Sitting/lateral leans;Sit to/from stand   Upper Body Dressing : Minimal assistance;Sitting   Lower Body Dressing: Moderate assistance;Sitting/lateral leans;Sit to/from stand   Toilet Transfer: Moderate assistance;Stand-pivot;Rolling walker (2 wheels);BSC/3in1 Toilet Transfer Details (indicate cue type and reason): assist to stabilize BSC, manuever RW and maintain balance. cues for hand placement, noted L UE incoordination on RW and BLE incoordination stepping Toileting- Clothing Manipulation and Hygiene: Moderate assistance;Sitting/lateral lean;Sit to/from stand Toileting - Clothing Manipulation Details (indicate cue type and reason): assisting with clothing mgmt but requires assist for hygiene in standing due to inability to reach on [facility]       General ADL Comments: Impaired coordination, standing balance     Vision Baseline Vision/History: 1 Wears glasses Ability to See in Adequate Light: 0  Adequate Patient Visual Report: No change from baseline Vision Assessment?: No apparent visual deficits;Wears glasses for reading      Perception         Praxis         Pertinent Vitals/Pain Pain Assessment Pain Assessment: No/denies pain     Extremity/Trunk Assessment Upper Extremity Assessment Upper Extremity Assessment: Generalized weakness;Right hand dominant;LUE deficits/detail LUE Deficits / Details: hx of incoordination and ataxic movements (from prior brain sx per pt), unsure if worsened or not. opposition impaired   Lower Extremity Assessment Lower Extremity Assessment: Defer to PT evaluation   Cervical / Trunk Assessment Cervical / Trunk Assessment: Normal   Communication Communication Communication: No apparent difficulties   Cognition Arousal: Alert, Lethargic Behavior During Therapy: WFL for tasks assessed/performed Overall Cognitive Status: No family/caregiver present to determine baseline cognitive functioning Area of Impairment: Attention, Memory, Following commands, Safety/judgement, Awareness, Problem solving                   Current Attention Level: Selective Memory: Decreased short-term memory Following Commands: Follows one step commands consistently, Follows multi-step commands with increased time Safety/Judgement: Decreased awareness of safety, Decreased awareness of deficits Awareness: Emergent Problem Solving: Slow processing, Difficulty sequencing, Requires verbal cues, Requires tactile cues General Comments: pleasant, reports August 2024 and aware of fall/situation leading to admission. follows commands consistently with cues for problem solving and sequencing. some memory deficits     General Comments  on 2 L O2 on entry at 95%. varied 90-93% on RA with activity. reported OP likely follow up to get CPAP - placed O2 back on at end of session    Exercises     Shoulder Instructions      Home Living Family/patient expects to be discharged to:: Private residence Living Arrangements: Spouse/significant other Available Help at Discharge: Family;Available 24  hours/day Type of Home: House Home Access: Stairs to enter [employer] of Steps: 5 Entrance Stairs-Rails: Left Home Layout: Two level;Bed/bath upstairs     Bathroom Shower/Tub: Producer, television/film/video: Handicapped height     Home Equipment: Rollator (4 wheels);Wheelchair - Biomedical scientist (2 wheels)   Additional Comments: considering getting a stair lift or moving to a more accessible home      Prior Functioning/Environment Prior Level of Function : Needs assist             Mobility Comments: uses rollator for mobility (downstairs) and RW upstairs. spouse recently assisting with stair mgmt ADLs Comments: uses RW to bathroom upstairs, able to toilet and dress self, some assist with showers        OT Problem List: Decreased strength;Impaired balance (sitting and/or standing);Decreased activity tolerance;Decreased coordination;Decreased cognition;Decreased safety awareness;Decreased knowledge of use of DME or AE;Impaired UE functional use      OT Treatment/Interventions: Self-care/ADL training;Therapeutic exercise;Energy conservation;DME and/or AE instruction;Therapeutic activities;Patient/family education;Balance training    OT Goals(Current goals can be found in the care plan section) Acute Rehab OT Goals Patient Stated Goal: go home if possible OT Goal Formulation: With patient Time For Goal Achievement: 05/19/23 Potential to Achieve Goals: Good  OT Frequency: Min 1X/week    Co-evaluation              AM-PAC OT "6 Clicks" Daily Activity     Outcome Measure Help from another person eating meals?: A Little Help from another person taking care of personal grooming?: A Little Help from another person toileting, which includes using toliet, bedpan, or urinal?: A Lot Help  from another person bathing (including washing, rinsing, drying)?: A Lot Help from another person to put on and taking off regular upper body clothing?: A  Little Help from another person to put on and taking off regular lower body clothing?: A Lot 6 Click Score: 15   End of Session Equipment Utilized During Treatment: Gait belt;Rolling walker (2 wheels) Nurse Communication: Mobility status  Activity Tolerance: Patient tolerated treatment well Patient left: in bed;with call bell/phone within reach;with bed alarm set  OT Visit Diagnosis: Unsteadiness on feet (R26.81);Other abnormalities of gait and mobility (R26.89);Muscle weakness (generalized) (M62.81);Other symptoms and signs involving cognitive function                Time: 1300-1330 OT Time Calculation (min): 30 min Charges:  OT General Charges $OT Visit: 1 Visit OT Evaluation $OT Eval Moderate Complexity: 1 Mod OT Treatments $Self Care/Home Management : 8-22 mins  Bradd Canary, OTR/L Acute Rehab Services Office: [phone]   Lorre Munroe 05/05/2023, 2:27 PM

## 2023-05-05 NOTE — Progress Notes (Addendum)
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  Patient has history of radiation to the brain. She subsequently developed radiation necrosis and dizziness.  She has decreased control of her left sided extremities, they will act on their own.  She has developed same movements on the right extremities.  Her dizziness is chronic and has increased in severity.  Due to unsteadiness she has fallen twice last week.  At baseline she sleeps 10 to 14 hours daily now she is sleeping a lot more.  Her sleep is deeper and she is hard to arouse and when aroused she is hard to orient.  There is more intermittent confusion.  She has worsening weakness today,  her husband could hardly get her to the stand  while assisting her to the restroom.  Patient also endorses generalized muscle ache,  chest pain with deep inhalation.  She is on Keytruda every 3 weeks.  Workup in the ED CT head unchanged multiple cerebral and cerebellar metastasis.  Subjective:  Patient was sleeping when I came in.  She was arousable by voice alone but stayed quite sleepy.  Denied any discomfort or pain.  Denies shortness of breath, I noted her nasal cannula were not in her nostrils but she said she felt okay.  Denied that there was anything that she needed assistance with.     Objective: Vitals:   05/04/23 1510 05/04/23 2005 05/05/23 0500 05/05/23 1359  BP: 113/82 101/75 106/69 101/72  Pulse: 75 (!) 106 96 90  Resp: 12 16 14 17   Temp: 98.2 F (36.8 C) 98.1 F (36.7 C) 98.2 F (36.8 C) 98 F (36.7 C)  TempSrc: Oral Oral Oral Oral  SpO2: 95% 97% 94% 96%    Intake/Output Summary (Last 24 hours) at 05/05/2023 1736 Last data filed at 05/05/2023 1532 Gross per 24 hour  Intake 1684.87 ml  Output --  Net 1684.87  ml   There were no vitals filed for this visit.   Exam:  General: Patient sleeping comfortably with nasal cannula on her forehead Eyes: sclera anicteric, conjuctiva mild injection bilaterally CVS: S1-S2, regular  Respiratory:  decreased air entry bilaterally secondary to decreased inspiratory effort, rales at bases  GI: NABS, soft, NT  LE: Warm and well-perfused   Data Reviewed:  Basic Metabolic Panel: Recent Labs  Lab 05/03/23 1922 05/04/23 0917 05/04/23 1939  NA 134* 136  --   K 4.2 3.0*  --   CL 103 107  --   CO2 24 20*  --   GLUCOSE 111* 95  --   BUN 16 12  --   CREATININE 1.60* 1.32* 1.42*  CALCIUM 7.8* 8.0*  --     CBC: Recent Labs  Lab 05/03/23 1922 05/04/23 0248 05/04/23 0917 05/04/23 1939  WBC 4.3 4.0 3.7* 4.3  NEUTROABS 2.7  --  2.1  --   HGB 8.2* 8.0* 8.5* 9.8*  HCT 24.6* 24.6* 26.6* 29.0*  MCV 102.9* 103.8* 105.6* 97.6  PLT 201 140* 147* 157     Scheduled Meds:  sodium chloride   Intravenous Once   amLODipine  10 mg Oral Daily   feeding supplement  237 mL Oral BID BM   folic acid  1 mg Oral Daily   OLANZapine  2.5 mg Oral QHS  oxyCODONE  10 mg Oral Q12H   pantoprazole  40 mg Oral Daily   protein supplement  1 Scoop Oral TID WC   Continuous Infusions:  sodium chloride 125 mL/hr at 05/04/23 1948     Assessment & Plan:   Intermittent confusion Somnolence Generalized weakness Metastatic NSCLCa Brain metastases Right sided weakness/incoordination Husband notes decreased functioning from baseline last Wednesday with increased weakness, difficulty with right sided weakness, confusion.  Head CT MRI without new or progressive abnormality but persistent vasogenic edema which has been subacute/chronic on top of her metastases.  Infectious work up is so far unrevealing  AKI and decreased intravascular volume may be causal given minimal reserve  Continue supportive care with IV fluids and blood transfusion as warranted Olanzapine is helpful  with confusion but may well be contributing to somnolence Denies pain which is well-controlled  AKI Intravascular fluid depletion Continue fluid resuscitation with normal saline 125 an hour Recheck creatinine in the morning  Hypokalemia Unclear if this was repleted yesterday Check BMP today and replete as warranted  Anemia Hematuria Anemia thought to be secondary to hematuria S/p 1 unit PRBC with appropriate increase in H&H Cause of hematuria is unclear Can consider urology consultation if warranted depending on goals for care Discussed with husband who notes decrease in functionality in past week but thinks that recent brain scans are same as the scans from October.     DVT prophylaxis: SCD due to hematuria Code Status: Full code Family Communication: Spoke with patient's husband on phone today       Studies: MR BRAIN WO CONTRAST  Result Date: 05/04/2023 CLINICAL DATA:  Brain metastases with neurologic changes. EXAM: MRI HEAD WITHOUT CONTRAST TECHNIQUE: Multiplanar, multiecho pulse sequences of the brain and surrounding structures were obtained without intravenous contrast. COMPARISON:  04/01/2023 FINDINGS: Brain: Known metastatic disease, underestimated relative to prior brain MRI in the absence of IV contrast. Fairly extensive vasogenic edema in the bilateral cerebellum, especially in the mid and superior portions. There has been a mild increase in edema at the right parietal lesion, although no increase in mass effect. Borderline increased vasogenic edema in the anterior left frontal lobe. No indication of acute hemorrhage, hydrocephalus, or collection. Vascular: Major flow voids are preserved Skull and upper cervical spine: Curved left occipital craniotomy site is unremarkable. Sinuses/Orbits: Negative. IMPRESSION: 1. No acute finding superimposed on the patient's cerebral metastatic disease. 2. Mild-to-moderate increase in vasogenic edema centered on a right parietal lesion.  Fairly extensive cerebellar edema is unchanged. No hydrocephalus or shift. Electronically Signed   By: Tiburcio Pea M.D.   On: 05/04/2023 12:03   CT Head Wo Contrast  Result Date: 05/03/2023 CLINICAL DATA:  Altered mental status EXAM: CT HEAD WITHOUT CONTRAST TECHNIQUE: Contiguous axial images were obtained from the base of the skull through the vertex without intravenous contrast. RADIATION DOSE REDUCTION: This exam was performed according to the departmental dose-optimization program which includes automated exposure control, adjustment of the mA and/or kV according to patient size and/or use of iterative reconstruction technique. COMPARISON:  12/01/2020 head CT Brain MRI 04/01/2023 FINDINGS: Brain: Large area of vasogenic edema within the right parietal lobe with a small area of calcification peripherally, unchanged from 04/01/2023. There are multiple other partially calcified lesions, most notably within the cerebellum that correspond to the previously demonstrated metastatic lesions. No acute hemorrhage or extra-axial collection. No midline shift or hydrocephalus. Vascular: Cavernous carotid calcification. Skull: Remote left occipital craniotomy Sinuses/Orbits: Negative Other: None IMPRESSION: Allowing for differences in modality,  unchanged appearance of multiple cerebral and cerebellar metastases. Electronically Signed   By: Deatra Robinson M.D.   On: 05/03/2023 19:07   DG Chest Port 1 View  Result Date: 05/03/2023 CLINICAL DATA:  Shortness of breath, fever EXAM: PORTABLE CHEST 1 VIEW COMPARISON:  11/29/2021 FINDINGS: Cardiomegaly. Aortic atherosclerosis. No confluent airspace opacities or effusions. No edema. No acute bony abnormality. IMPRESSION: Cardiomegaly, no active disease. Electronically Signed   By: Charlett Nose M.D.   On: 05/03/2023 18:50    Principal Problem:   AKI (acute kidney injury) (HCC) Active Problems:   Malignant neoplasm metastatic to brain (HCC)   Non-small cell lung cancer  metastatic to bone (HCC)   Hyperlipidemia   Hypertension   COPD (chronic obstructive pulmonary disease) (HCC)   Malignant neoplasm metastatic to bone [facility])   Palliative care patient   Metastasis to brain [facility])   Cancer associated pain   Anemia     Sricharan Lacomb Orma Flaming, [facility]  If 7PM-7AM, please contact night-coverage [email or web address]   LOS: 2 days

## 2023-05-05 NOTE — Plan of Care (Signed)

## 2023-05-06 ENCOUNTER — Other Ambulatory Visit: Payer: Self-pay | Admitting: *Deleted

## 2023-05-06 ENCOUNTER — Other Ambulatory Visit: Payer: Self-pay | Admitting: Internal Medicine

## 2023-05-06 ENCOUNTER — Telehealth: Payer: Self-pay | Admitting: *Deleted

## 2023-05-06 DIAGNOSIS — J449 Chronic obstructive pulmonary disease, unspecified: Secondary | ICD-10-CM

## 2023-05-06 DIAGNOSIS — Z9181 History of falling: Secondary | ICD-10-CM

## 2023-05-06 DIAGNOSIS — R42 Dizziness and giddiness: Secondary | ICD-10-CM

## 2023-05-06 DIAGNOSIS — C3431 Malignant neoplasm of lower lobe, right bronchus or lung: Secondary | ICD-10-CM | POA: Diagnosis not present

## 2023-05-06 DIAGNOSIS — N179 Acute kidney failure, unspecified: Secondary | ICD-10-CM | POA: Diagnosis not present

## 2023-05-06 DIAGNOSIS — C7931 Secondary malignant neoplasm of brain: Secondary | ICD-10-CM | POA: Diagnosis not present

## 2023-05-06 DIAGNOSIS — E785 Hyperlipidemia, unspecified: Secondary | ICD-10-CM

## 2023-05-06 DIAGNOSIS — I10 Essential (primary) hypertension: Secondary | ICD-10-CM

## 2023-05-06 DIAGNOSIS — C7951 Secondary malignant neoplasm of bone: Secondary | ICD-10-CM | POA: Diagnosis not present

## 2023-05-06 LAB — BASIC METABOLIC PANEL
Anion gap: 12 (ref 5–15)
BUN: 11 mg/dL (ref 8–23)
CO2: 22 mmol/L (ref 22–32)
Calcium: 8 mg/dL — ABNORMAL LOW (ref 8.9–10.3)
Chloride: 106 mmol/L (ref 98–111)
Creatinine, Ser: 1.4 mg/dL — ABNORMAL HIGH (ref 0.44–1.00)
GFR, Estimated: 42 mL/min — ABNORMAL LOW (ref >60)
Glucose, Bld: 108 mg/dL — ABNORMAL HIGH (ref 70–99)
Potassium: 3.1 mmol/L — ABNORMAL LOW (ref 3.5–5.1)
Sodium: 140 mmol/L (ref 135–145)

## 2023-05-06 LAB — CBC
HCT: 29.8 % — ABNORMAL LOW (ref 36.0–46.0)
Hemoglobin: 9.9 g/dL — ABNORMAL LOW (ref 12.0–15.0)
MCH: 33.1 pg (ref 26.0–34.0)
MCHC: 33.2 g/dL (ref 30.0–36.0)
MCV: 99.7 fL (ref 80.0–100.0)
Platelets: 168 10*3/uL (ref 150–400)
RBC: 2.99 MIL/uL — ABNORMAL LOW (ref 3.87–5.11)
RDW: 14.3 % (ref 11.5–15.5)
WBC: 4.9 10*3/uL (ref 4.0–10.5)
nRBC: 0 % (ref 0.0–0.2)

## 2023-05-06 LAB — TYPE AND SCREEN
ABO/RH(D): A POS
Antibody Screen: NEGATIVE
Unit division: 0

## 2023-05-06 LAB — BPAM RBC
Blood Product Expiration Date: 202409042359
ISSUE DATE / TIME: 202408241228
Unit Type and Rh: 6200

## 2023-05-06 MED ORDER — ONDANSETRON HCL 4 MG/2ML IJ SOLN
4.0000 mg | Freq: Once | INTRAMUSCULAR | Status: AC
  Administered 2023-05-06: 4 mg via INTRAVENOUS
  Filled 2023-05-06: qty 2

## 2023-05-06 MED ORDER — RESOURCE INSTANT PROTEIN PO PWD PACKET
1.0000 | Freq: Three times a day (TID) | ORAL | Status: DC
  Administered 2023-05-06 – 2023-05-08 (×5): 6 g via ORAL
  Filled 2023-05-06 (×16): qty 6

## 2023-05-06 MED ORDER — POTASSIUM CHLORIDE CRYS ER 20 MEQ PO TBCR
40.0000 meq | EXTENDED_RELEASE_TABLET | Freq: Every day | ORAL | Status: DC
  Administered 2023-05-06 – 2023-05-11 (×6): 40 meq via ORAL
  Filled 2023-05-06 (×6): qty 2

## 2023-05-06 MED ORDER — DEXAMETHASONE 4 MG PO TABS
4.0000 mg | ORAL_TABLET | Freq: Two times a day (BID) | ORAL | Status: DC
  Administered 2023-05-06 – 2023-05-11 (×10): 4 mg via ORAL
  Filled 2023-05-06 (×10): qty 1

## 2023-05-06 NOTE — Plan of Care (Signed)

## 2023-05-06 NOTE — Telephone Encounter (Signed)
Received call from pt's husband, Harvie Heck. He states that Virginia [name] is in the hospital =[facility] room 1608. He would like to talk to Dr. Barbaraann Cao and Dr. [name] about her condition. He states she is a bit better now than when she was admitted but he needs to discuss overall goals with them. Advised that I would let both of them know of her situation.  He voiced appreciation for this.

## 2023-05-06 NOTE — Progress Notes (Signed)
Physical Therapy Treatment Patient Details Name: Virginia [name] MRN: [id number] DOB: [birth year] Today's Date: 05/06/2023   History of Present Illness 67 yo female admitted with AKI, metabolic encephalopathy, weakness. Hx of NSCLC with mets, COPD, radiation necrosis, chronic dizziness. anemia, falls.    PT Comments  Pt agreeable to working with therapy. Much more alert and participatory on today. Mod A for safe mobility. Mod A for toileting hygiene. High fall risk when mobilizing. Pt tolerated session well. Updated recs to AIR-rehab assessing for appropriateness. Will continue to follow and progress mobility as safely able.    If plan is discharge home, recommend the following: Assistance with cooking/housework;Assist for transportation;Help with stairs or ramp for entrance;A lot of help with walking and/or transfers;A lot of help with bathing/dressing/bathroom   Can travel by private vehicle        Equipment Recommendations  None recommended by PT    Recommendations for Other Services Rehab consult     Precautions / Restrictions Precautions Precautions: Fall Restrictions Weight Bearing Restrictions: No     Mobility  Bed Mobility Overal bed mobility: Needs Assistance Bed Mobility: Supine to Sit, Sit to Supine     Supine to sit: Contact guard, HOB elevated, Used rails Sit to supine: Contact guard assist, HOB elevated, Used rails   General bed mobility comments: Increased time. Cues for safety. CGA for safety, monitoring line    Transfers Overall transfer level: Needs assistance Equipment used: Rolling walker (2 wheels) Transfers: Sit to/from Stand Sit to Stand: Mod assist Stand pivot transfers: Mod assist         General transfer comment: Cues for hand placement as pt tending to pull on RW with wheels coming off of floor. Assist to power up, steady, control descent. Unsteady/fall risk 2* poor balance.    Ambulation/Gait Ambulation/Gait assistance: Mod  assist Gait Distance (Feet): 5 Feet Assistive device: Rolling walker (2 wheels) Gait Pattern/deviations: Step-to pattern       General Gait Details: Assist to stablize throughout short distance. Gait is mildly ataxic. Fall risk when ambulating 2* poor balance. Cues for safety, proper use and proximity to walker.   Stairs             Wheelchair Mobility     Tilt Bed    Modified Rankin (Stroke Patients Only)       Balance Overall balance assessment: Needs assistance         Standing balance support: Bilateral upper extremity supported, During functional activity Standing balance-Leahy Scale: Poor                              Cognition Arousal: Alert Behavior During Therapy: WFL for tasks assessed/performed Overall Cognitive Status: No family/caregiver present to determine baseline cognitive functioning Area of Impairment: Memory, Following commands                     Memory: Decreased short-term memory Following Commands: Follows one step commands consistently Safety/Judgement: Decreased awareness of safety, Decreased awareness of deficits   Problem Solving: Requires verbal cues          Exercises      General Comments        Pertinent Vitals/Pain Pain Assessment Pain Assessment: No/denies pain    Home Living                          Prior Function  PT Goals (current goals can now be found in the care plan section) Progress towards PT goals: Progressing toward goals    Frequency    Min 1X/week      PT Plan      Co-evaluation              AM-PAC PT "6 Clicks" Mobility   Outcome Measure  Help needed turning from your back to your side while in a flat bed without using bedrails?: A Little Help needed moving from lying on your back to sitting on the side of a flat bed without using bedrails?: A Lot Help needed moving to and from a bed to a chair (including a wheelchair)?: A Lot Help  needed standing up from a chair using your arms (e.g., wheelchair or bedside chair)?: A Lot Help needed to walk in hospital room?: A Lot Help needed climbing 3-5 steps with a railing? : Total 6 Click Score: 12    End of Session Equipment Utilized During Treatment: Gait belt Activity Tolerance: Patient tolerated treatment well Patient left: in bed;with call bell/phone within reach;with bed alarm set   PT Visit Diagnosis: Muscle weakness (generalized) (M62.81);Difficulty in walking, not elsewhere classified (R26.2)     Time: 1425-1440 PT Time Calculation (min) (ACUTE ONLY): 15 min  Charges:    $Gait Training: 8-22 mins PT General Charges $$ ACUTE PT VISIT: 1 Visit                         Faye Ramsay, PT Acute Rehabilitation  Office: [phone]

## 2023-05-06 NOTE — Progress Notes (Signed)
Inpatient Rehab Admissions Coordinator:   Per OT recommendations pt was screened for CIR by Estill Dooms, PT, DPT.  Admitted for AKI and encephalopathy.  Known advanced cancer.  Workup on going.  Will place order for CIR consult and we will assess further for possible candidacy.   Estill Dooms, PT, DPT Admissions Coordinator [phone] 05/06/23  12:11 PM

## 2023-05-06 NOTE — Plan of Care (Signed)
  Problem: Education: Goal: Knowledge of General Education information will improve Description Including pain rating scale, medication(s)/side effects and non-pharmacologic comfort measures Outcome: Progressing   

## 2023-05-06 NOTE — Care Management Important Message (Signed)
Important Message  Patient Details IM Letter given. Name: JACKQUELIN SHANOR MRN: [id number] Date of Birth: May 29, 1956   Medicare Important Message Given:  Yes     Caren Macadam 05/06/2023, 1:14 PM

## 2023-05-06 NOTE — Consult Note (Signed)
[facility] Neuro-Oncology Consult Note  Patient Care Team: Baxley, Luanna Cole, MD as PCP - General (Internal Medicine) Jaci Standard, MD as PCP - Hematology/Oncology (Hematology and Oncology) Syliva Overman, RN as Oncology Nurse Navigator Edsel Petrin, DO as Consulting Physician (Hospice and Palliative Medicine)  CHIEF COMPLAINTS/PURPOSE OF CONSULTATION:  Dystaxia Brain Metastases  HISTORY OF PRESENTING ILLNESS:  Virginia [name] 67 y.o. female presented with several days history of worsening weakness, dizziness, lethargy.  She was unable to stand to get to the bathroom.  Here in the hospital she has undergone a transfusion, this has helped her energy level and even walking/balance.  Today she was able to get to the bathroom on her own.  Not on any decadron currently.  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 OR;  Service: Neurosurgery;  Laterality: N/A;   CHOLECYSTECTOMY     CRANIOTOMY N/A 12/02/2020   Procedure: Posterior fossa craniotomy for tumor resection with brainlab;  Surgeon: Maeola Harman, MD;  Location: [facility] OR;  Service: Neurosurgery;  Laterality: N/A;   DILATION AND CURETTAGE OF UTERUS     IR IMAGING GUIDED PORT INSERTION  10/23/2019   IR PATIENT EVAL TECH 0-60 MINS  11/21/2021   IR PATIENT EVAL TECH 0-60 MINS  11/24/2021    IR PATIENT EVAL TECH 0-60 MINS  11/29/2021   IR PATIENT EVAL TECH 0-60 MINS  12/04/2021   IR PATIENT EVAL TECH 0-60 MINS  12/12/2021   IR PATIENT EVAL TECH 0-60 MINS  12/19/2021   IR PATIENT EVAL TECH 0-60 MINS  12/27/2021   IR PATIENT EVAL TECH 0-60 MINS  01/03/2022   IR PATIENT EVAL TECH 0-60 MINS  01/10/2022   IR PATIENT EVAL TECH 0-60 MINS  01/18/2022   IR PATIENT EVAL TECH 0-60 MINS  02/13/2022   IR PATIENT EVAL TECH 0-60 MINS  02/20/2022   IR RADIOLOGIST EVAL & MGMT  01/23/2022   IR REMOVAL TUN ACCESS W/ PORT W/O FL MOD SED  11/17/2021   KYPHOPLASTY N/A 03/15/2020   Procedure: Thoracic Eight KYPHOPLASTY;  Surgeon: Maeola Harman, MD;  Location: [facility] OR;  Service: Neurosurgery;  Laterality: N/A;  prone    LIPOMA EXCISION  2018   removed under left breast and right thigh.   TUBAL LIGATION      SOCIAL HISTORY: Social History   Socioeconomic History   Marital status: Married    Spouse name: Not on file   Number of children: 3   Years of education: Not on file   Highest education level: Not on file  Occupational History   Occupation: owns Editor, commissioning co    Employer: [employer]  Tobacco Use   Smoking status: Former    Current packs/day: 0.00  Types: Cigarettes    Quit date: 10/31/2012    Years since quitting: 10.5   Smokeless tobacco: Never  Vaping Use   Vaping status: Never Used  Substance and Sexual Activity   Alcohol use: Yes    Alcohol/week: 0.0 standard drinks of alcohol    Comment: rare   Drug use: No   Sexual activity: Yes  Other Topics Concern   Not on file  Social History Narrative   Not on file   Social Determinants of Health   Financial Resource Strain: Not on file  Food Insecurity: No Food Insecurity (05/04/2023)   Hunger Vital Sign    Worried About Running Out of Food in the Last Year: Never true    Ran Out of Food in the Last Year: Never true  Transportation Needs: No Transportation Needs (05/04/2023)   PRAPARE - Administrator, Civil Service  (Medical): No    Lack of Transportation (Non-Medical): No  Physical Activity: Not on file  Stress: Not on file  Social Connections: Not on file  Intimate Partner Violence: Not At Risk (05/04/2023)   Humiliation, Afraid, Rape, and Kick questionnaire    Fear of Current or Ex-Partner: No    Emotionally Abused: No    Physically Abused: No    Sexually Abused: No    FAMILY HISTORY: Family History  Problem Relation Age of Onset   Heart disease Father    Drug abuse Daughter    Drug abuse Son    Cancer Sister    Heart disease Brother    Heart attack Brother     ALLERGIES:  has No Known Allergies.  MEDICATIONS:  Current Facility-Administered Medications  Medication Dose Route Frequency Provider Last Rate Last Admin   0.9 %  sodium chloride infusion (Manually program via Guardrails IV Fluids)   Intravenous Once Crosley, Debby, MD       0.9 %  sodium chloride infusion   Intravenous Continuous Crosley, Debby, MD 125 mL/hr at 05/06/23 1106 New Bag at 05/06/23 1106   acetaminophen (TYLENOL) tablet 650 mg  650 mg Oral Q6H PRN Gery Pray, MD   650 mg at 05/06/23 1410   Or   acetaminophen (TYLENOL) suppository 650 mg  650 mg Rectal Q6H PRN Crosley, Debby, MD       ALPRAZolam Prudy Feeler) tablet 0.25 mg  0.25 mg Oral BID PRN Joneen Roach, Debby, MD   0.25 mg at 05/04/23 0936   amLODipine (NORVASC) tablet 10 mg  10 mg Oral Daily Crosley, Debby, MD   10 mg at 05/06/23 1034   feeding supplement (ENSURE ENLIVE / ENSURE PLUS) liquid 237 mL  237 mL Oral BID BM Crosley, Debby, MD   237 mL at 05/06/23 1410   folic acid (FOLVITE) tablet 1 mg  1 mg Oral Daily Crosley, Debby, MD   1 mg at 05/06/23 1034   meclizine (ANTIVERT) tablet 25 mg  25 mg Oral TID PRN Crosley, Debby, MD       naphazoline-pheniramine (NAPHCON-A) 0.025-0.3 % ophthalmic solution 1 drop  1 drop Both Eyes QID PRN Crosley, Debby, MD       OLANZapine (ZYPREXA) tablet 2.5 mg  2.5 mg Oral QHS Crosley, Debby, MD   2.5 mg at 05/05/23 2145   oxyCODONE  (Oxy IR/ROXICODONE) immediate release tablet 5 mg  5 mg Oral Q3H PRN Gery Pray, MD   5 mg at 05/05/23 2206   oxyCODONE (OXYCONTIN) 12 hr tablet 10 mg  10 mg Oral Q12H Gery Pray, MD  10 mg at 05/06/23 1034   pantoprazole (PROTONIX) EC tablet 40 mg  40 mg Oral Daily Gery Pray, MD   40 mg at 05/06/23 1034   protein supplement (RESOURCE BENEPROTEIN) powder packet 6 g  1 Scoop Oral TID WC Crosley, Debby, MD   6 g at 05/06/23 1410   senna-docusate (Senokot-S) tablet 1 tablet  1 tablet Oral QHS PRN Gery Pray, MD        REVIEW OF SYSTEMS:   Constitutional: Denies fevers, chills or abnormal weight loss Eyes: Denies blurriness of vision Ears, nose, mouth, throat, and face: Denies mucositis or sore throat Respiratory: Denies cough, dyspnea or wheezes Cardiovascular: Denies palpitation, chest discomfort or lower extremity swelling Gastrointestinal:  Denies nausea, constipation, diarrhea GU: Denies dysuria or incontinence Skin: Denies abnormal skin rashes Neurological: Per HPI Musculoskeletal: Denies joint pain, back or neck discomfort. No decrease in ROM Behavioral/Psych: Denies anxiety, disturbance in thought content, and mood instability   PHYSICAL EXAMINATION: Vitals:   05/06/23 0552 05/06/23 1244  BP: 121/81 105/72  Pulse:  95  Resp: 16 16  Temp: 98.6 F (37 C) 98.7 F (37.1 C)  SpO2: 94% 94%   KPS: 60. General: Alert, cooperative, pleasant, in no acute distress Head: Normal EENT: No conjunctival injection or scleral icterus. Oral mucosa moist Lungs: Resp effort normal Cardiac: Regular rate and rhythm Abdomen: Soft, non-distended abdomen Skin: No rashes cyanosis or petechiae. Extremities: No clubbing or edema  Neurologic Exam: Mental Status: Awake, alert, attentive to examiner. Oriented to self and environment. Language is fluent with intact comprehension.  Cranial Nerves: Visual acuity is grossly normal. Noted dysarthria. Visual fields are full. Extra-ocular  movements intact. No ptosis. Face is symmetric Motor: Tone and bulk are normal. Power is full in both arms and legs. Reflexes are symmetric, no pathologic reflexes present.  Dense dysmetria, coordination impaired bilaterally. Sensory: Intact to light touch Gait: Non ambulatory today   LABORATORY DATA:  I have reviewed the data as listed Lab Results  Component Value Date   WBC 4.9 05/06/2023   HGB 9.9 (L) 05/06/2023   HCT 29.8 (L) 05/06/2023   MCV 99.7 05/06/2023   PLT 168 05/06/2023   Recent Labs    04/04/23 0923 04/26/23 1228 04/26/23 1228 05/03/23 1922 05/04/23 0917 05/04/23 1939 05/05/23 2007 05/06/23 0846  NA 141 140  --  134* 136  --  136 140  K 3.6 3.6  --  4.2 3.0*  --  3.2* 3.1*  CL 103 103  --  103 107  --  107 106  CO2 31 31  --  24 20*  --  24 22  GLUCOSE 105* 104*  --  111* 95  --  129* 108*  BUN 14 11  --  16 12  --  13 11  CREATININE 0.86 1.09*   < > 1.60* 1.32* 1.42* 1.44* 1.40*  CALCIUM 9.1 8.4*  --  7.8* 8.0*  --  7.3* 8.0*  GFRNONAA >60 56*   < > 36* 45* 41* 40* 42*  PROT 6.7 6.8  --  6.1*  --   --   --   --   ALBUMIN 3.1* 2.9*  --  2.2*  --   --   --   --   AST 19 18  --  20  --   --   --   --   ALT 9 8  --  13  --   --   --   --  ALKPHOS 64 61  --  54  --   --   --   --   BILITOT 0.3 0.4  --  0.6  --   --   --   --    < > = values in this interval not displayed.    RADIOGRAPHIC STUDIES: I have personally reviewed the radiological images as listed and agreed with the findings in the report. MR BRAIN WO CONTRAST  Result Date: 05/04/2023 CLINICAL DATA:  Brain metastases with neurologic changes. EXAM: MRI HEAD WITHOUT CONTRAST TECHNIQUE: Multiplanar, multiecho pulse sequences of the brain and surrounding structures were obtained without intravenous contrast. COMPARISON:  04/01/2023 FINDINGS: Brain: Known metastatic disease, underestimated relative to prior brain MRI in the absence of IV contrast. Fairly extensive vasogenic edema in the bilateral  cerebellum, especially in the mid and superior portions. There has been a mild increase in edema at the right parietal lesion, although no increase in mass effect. Borderline increased vasogenic edema in the anterior left frontal lobe. No indication of acute hemorrhage, hydrocephalus, or collection. Vascular: Major flow voids are preserved Skull and upper cervical spine: Curved left occipital craniotomy site is unremarkable. Sinuses/Orbits: Negative. IMPRESSION: 1. No acute finding superimposed on the patient's cerebral metastatic disease. 2. Mild-to-moderate increase in vasogenic edema centered on a right parietal lesion. Fairly extensive cerebellar edema is unchanged. No hydrocephalus or shift. Electronically Signed   By: Tiburcio Pea M.D.   On: 05/04/2023 12:03   CT Head Wo Contrast  Result Date: 05/03/2023 CLINICAL DATA:  Altered mental status EXAM: CT HEAD WITHOUT CONTRAST TECHNIQUE: Contiguous axial images were obtained from the base of the skull through the vertex without intravenous contrast. RADIATION DOSE REDUCTION: This exam was performed according to the departmental dose-optimization program which includes automated exposure control, adjustment of the mA and/or kV according to patient size and/or use of iterative reconstruction technique. COMPARISON:  12/01/2020 head CT Brain MRI 04/01/2023 FINDINGS: Brain: Large area of vasogenic edema within the right parietal lobe with a small area of calcification peripherally, unchanged from 04/01/2023. There are multiple other partially calcified lesions, most notably within the cerebellum that correspond to the previously demonstrated metastatic lesions. No acute hemorrhage or extra-axial collection. No midline shift or hydrocephalus. Vascular: Cavernous carotid calcification. Skull: Remote left occipital craniotomy Sinuses/Orbits: Negative Other: None IMPRESSION: Allowing for differences in modality, unchanged appearance of multiple cerebral and  cerebellar metastases. Electronically Signed   By: Deatra Robinson M.D.   On: 05/03/2023 19:07   DG Chest Port 1 View  Result Date: 05/03/2023 CLINICAL DATA:  Shortness of breath, fever EXAM: PORTABLE CHEST 1 VIEW COMPARISON:  11/29/2021 FINDINGS: Cardiomegaly. Aortic atherosclerosis. No confluent airspace opacities or effusions. No edema. No acute bony abnormality. IMPRESSION: Cardiomegaly, no active disease. Electronically Signed   By: Charlett Nose M.D.   On: 05/03/2023 18:50    ASSESSMENT & PLAN:  Dystaxia Brain Metastases  Virginia [name] presents today with some focal neurologic decompensation.  Etiology is multifactorial; secondary to anemia as well as inflammatory changes seen on MRI brain.  No contrast was administered for this study.  Increased enhancement on prior study (July) we suspect is partially artifactual due to avastin withdrawal.  Worsening right parietal edema on 05/04/23 study is noted, however.  Recommended resuming decadron 4mg  BID, including at discharge.    We will review case in CNS tumor board and plan to follow up in 2 weeks with contrast enhanced MRI study for review.  All questions were answered. The  patient knows to call the clinic with any problems, questions or concerns.  The total time spent in the encounter was 60 minutes and more than 50% was on counseling and review of test results     Henreitta Leber, MD 05/06/2023 3:46 PM

## 2023-05-06 NOTE — Progress Notes (Signed)
HOSPITALIST ROUNDING NOTE SHAWNTIA SANTELL WUJ:[id number]  DOB: [birth year]  DOA: 05/03/2023  PCP: [name], MD  05/06/2023,8:02 AM   LOS: 3 days      Code Status: Full code   From: Home  current Dispo: Potential CIR?     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 Venetia Maxon 2022-cycle 52 of maintenance pembrolizumab/pemetrexed ?  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 from this --rest of workup for encephalopathy other than TSH as below was negative Decadron 4 twice daily started by neuro-oncology and to continue Ataxias improved --has been walking around some in the room Will need further follow-up with oncology Careful use of Oxy IR 5 mg every 3 as needed moderate pain and OxyContin 10 every 12 watch for sedation Also watch for sedation on Zyprexa 2.5 at bedtime and the Xanax 0.25 twice daily as needed  TSH 0.13 on 8/24 Was not worked up further-would recheck however in the outpatient space once patient is more stable Could have simply a suppressed TSH from acute illness and would not interpret this while hospitalized  AKI on admission Improved to some degree seems like new normal is creatinine 1.4 Saline lock IV and observe labs in the next 24 hours  Mild hypokalemia Replace orally 40 mill equivalents daily and recheck labs in the morning  Anemia and hematuria No further bleeding was transfuse 1 unit PRBC earlier in hospital stay (8/24) If recurrence formally please  consult urology-it does not appear that they were ever engaged after progress note 8/24--May need CT abdomen pelvis which was also held because of rising creatinine Hemoccult never collected-watch for bleeding  DVT prophylaxis: SCD  Status is: Inpatient Remains inpatient appropriate because:   Requires further improvement    Subjective: Some aphasia which seems to be baseline-she is moving around better according to nursing and was able to ambulate some No pain no fever no chills  Objective + exam Vitals:   05/05/23 0500 05/05/23 1359 05/05/23 2017 05/06/23 0552  BP: 106/69 101/72 116/75 121/81  Pulse: 96 90 99   Resp: 14 17 18 16   Temp: 98.2 F (36.8 C) 98 F (36.7 C) 99.9 F (37.7 C) 98.6 F (37 C)  TempSrc: Oral Oral Oral Oral  SpO2: 94% 96% 92% 94%   There were no vitals filed for this visit.  Examination:  EOMI NCAT mild aphasia but patient is intelligible Chest is clear Able to straight leg raise bilaterally and able to raise arms above head Reflexes deferred Abdomen soft no rebound or guarding S1-S2 no murmur no rub no gallop  Data Reviewed: reviewed   CBC    Component Value Date/Time   WBC 4.3 05/04/2023 1939   RBC 2.97 (L) 05/04/2023 1939   HGB 9.8 (L) 05/04/2023 1939   HGB 11.4 (L) 04/26/2023 1228   HCT 29.0 (L) 05/04/2023 1939   PLT 157 05/04/2023 1939   PLT 392 04/26/2023 1228   MCV 97.6 05/04/2023 1939   MCH 33.0 05/04/2023 1939   MCHC 33.8 05/04/2023 1939   RDW 15.0 05/04/2023 1939   LYMPHSABS  0.9 05/04/2023 0917   MONOABS 0.6 05/04/2023 0917   EOSABS 0.1 05/04/2023 0917   BASOSABS 0.0 05/04/2023 0917      Latest Ref Rng & Units 05/05/2023    8:07 PM 05/04/2023    7:39 PM 05/04/2023    9:17 AM  CMP  Glucose 70 - 99 mg/dL 782   95   BUN 8 - 23 mg/dL 13   12   Creatinine 9.56 - 1.00 mg/dL 2.13  0.86  5.78   Sodium 135 - 145 mmol/L 136   136   Potassium 3.5 - 5.1 mmol/L 3.2   3.0   Chloride 98 - 111 mmol/L 107   107   CO2 22 - 32 mmol/L  24   20   Calcium 8.9 - 10.3 mg/dL 7.3   8.0      Scheduled Meds:  sodium chloride   Intravenous Once   amLODipine  10 mg Oral Daily   dexamethasone  4 mg Oral Q12H   feeding supplement  237 mL Oral BID BM   folic acid  1 mg Oral Daily   OLANZapine  2.5 mg Oral QHS   oxyCODONE  10 mg Oral Q12H   pantoprazole  40 mg Oral Daily   potassium chloride  40 mEq Oral Daily   protein supplement  1 Scoop Oral TID WC   Continuous Infusions:  sodium chloride 125 mL/hr at 05/04/23 1948    Time  55  Rhetta Mura, MD  [facility]

## 2023-05-07 DIAGNOSIS — N179 Acute kidney failure, unspecified: Secondary | ICD-10-CM | POA: Diagnosis not present

## 2023-05-07 LAB — COMPREHENSIVE METABOLIC PANEL
ALT: 11 U/L (ref 0–44)
AST: 16 U/L (ref 15–41)
Albumin: 1.9 g/dL — ABNORMAL LOW (ref 3.5–5.0)
Alkaline Phosphatase: 47 U/L (ref 38–126)
Anion gap: 10 (ref 5–15)
BUN: 12 mg/dL (ref 8–23)
CO2: 22 mmol/L (ref 22–32)
Calcium: 7.8 mg/dL — ABNORMAL LOW (ref 8.9–10.3)
Chloride: 105 mmol/L (ref 98–111)
Creatinine, Ser: 1.41 mg/dL — ABNORMAL HIGH (ref 0.44–1.00)
GFR, Estimated: 41 mL/min — ABNORMAL LOW (ref >60)
Glucose, Bld: 145 mg/dL — ABNORMAL HIGH (ref 70–99)
Potassium: 3.2 mmol/L — ABNORMAL LOW (ref 3.5–5.1)
Sodium: 137 mmol/L (ref 135–145)
Total Bilirubin: 0.6 mg/dL (ref 0.3–1.2)
Total Protein: 5.7 g/dL — ABNORMAL LOW (ref 6.5–8.1)

## 2023-05-07 LAB — CBC
HCT: 25.7 % — ABNORMAL LOW (ref 36.0–46.0)
Hemoglobin: 8.8 g/dL — ABNORMAL LOW (ref 12.0–15.0)
MCH: 33.1 pg (ref 26.0–34.0)
MCHC: 34.2 g/dL (ref 30.0–36.0)
MCV: 96.6 fL (ref 80.0–100.0)
Platelets: 160 10*3/uL (ref 150–400)
RBC: 2.66 MIL/uL — ABNORMAL LOW (ref 3.87–5.11)
RDW: 13.7 % (ref 11.5–15.5)
WBC: 2.8 10*3/uL — ABNORMAL LOW (ref 4.0–10.5)
nRBC: 0 % (ref 0.0–0.2)

## 2023-05-07 MED ORDER — ALBUTEROL SULFATE HFA 108 (90 BASE) MCG/ACT IN AERS: 2.0000 | INHALATION_SPRAY | Freq: Four times a day (QID) | RESPIRATORY_TRACT | Status: DC | PRN

## 2023-05-07 MED ORDER — ALBUTEROL SULFATE (2.5 MG/3ML) 0.083% IN NEBU: 2.5000 mg | INHALATION_SOLUTION | Freq: Four times a day (QID) | RESPIRATORY_TRACT | Status: DC | PRN

## 2023-05-07 MED ORDER — POLYETHYLENE GLYCOL 3350 17 G PO PACK
17.0000 g | PACK | Freq: Every evening | ORAL | Status: DC
  Administered 2023-05-07 – 2023-05-10 (×4): 17 g via ORAL
  Filled 2023-05-07 (×4): qty 1

## 2023-05-07 NOTE — Progress Notes (Addendum)
Progress Note   Patient: Virginia [name] DGL:[id number] DOB: 1956-03-05 DOA: 05/03/2023     4 DOS: the patient was seen and examined on 05/07/2023   Brief hospital course: 66yo with NCSLC with mets to brain and bone, HTN, HLD, and COPD who presented on 8/24 with weakness and confusion.  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  She was found to have AKI associated with dehydration.  Brain MRI with no acute changes superimposed on known metastatic disease, mild to moderate increase in R parietal region vasogenic edema.  Dr. Barbaraann Cao consulted and recommended resuming Decadron with plan to review case at CNS Tumor Board and f/o MRI in 2 weeks.   Assessment and Plan:  Acute kidney injury Likely prerenal in the setting of dehydration. Baseline serum creatinine 0.86 presented with creatinine 1.6, stabilized at 1.31 currently IV fluid hydration.   Follow BMP   Avoid nephrotoxic medications.   Generalized weakness: > multifactorial Thought to be due to symptomatic anemia on presentation and transfused 1 unit PRBC. She remains significantly dizzy continuously, including at rest She is having difficulty participating in therapy due to dizziness PT and OT consulting   Symptomatic anemia Likely secondary to hematuria Transfused 1 unit PRBC Hgb 8.8 on 8/27, down from 9.9 post-transfusion Continue to follow CBC   Intermittent confusion/decreased ability to control right extremity CT head without new or progressive abnormality MRI brain: No acute findings superimposed on patient's cerebral metastatic disease. No overt evidence of infection.  Blood cultures x 1 negative x 3 days  Hematuria Without evidence of infection Urology consult needed if hematuria recurs Consider CT A/P with contrast when creatinine improved after hydration. SCDs DVT prophylaxis   Hypoalbuminemia Patient's albumin level steadily decreasing, 2.2 01/10/2023 albumin 3.6.  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 Barbaraann Cao consulted on 8/26, recommended resuming Decadron BID Will review case at CNS tumor board Will f/u in 2 weeks with contrast,enhanced MRI study for review Palliative care consulted   Hypertension Continue Norvasc    Anxiety Continue Xanax, Zyprexa.    Consultants:  Neuro Oncology Palliative care PT/OT CIR  Procedures: None  Antibiotics: None  30 Day Unplanned Readmission Risk Score    Flowsheet Row ED to Hosp-Admission (Current) from 05/03/2023 in [facility]  30 Day Unplanned Readmission Risk Score (%) 23.07 Filed at 05/07/2023 0801       This score is the patient's risk of an unplanned readmission within 30 days of being discharged (0 -100%). The score is based on dignosis, age, lab data, medications, orders, and past utilization.   Low:  0-14.9   Medium: 15-21.9   High: 22-29.9   Extreme: 30 and above           Subjective: Patient and husband present.  She was still very altered on Sunday AM and had end of life discussions.  She has since rallied and is now considering CIR.  However, she has had dizziness for 2 years (some prior marginal improvement with steroids?) and was started on Decadron last night with hopes that she will improve enough to participate in CIR.   Objective: Vitals:   05/07/23 0437 05/07/23 1358  BP: 111/72 115/78  Pulse: 87 79  Resp: 14 18  Temp: (!) 97.5 F (36.4  C) 97.7 F (36.5 C)  SpO2: 96% 97%    Intake/Output Summary (Last 24 hours) at 05/07/2023 1830 Last data filed at 05/06/2023 2033 Gross per 24 hour  Intake 240 ml  Output --  Net 240 ml   There were no vitals filed for this visit.  Exam:  General:  Appears somnolent, frail Eyes:  EOMI,  normal lids, iris ENT:  grossly normal hearing, lips & tongue, mmm Neck:  no LAD, masses or thyromegaly Cardiovascular:  RRR, no m/r/g. No LE edema.  Respiratory:   CTA bilaterally with no wheezes/rales/rhonchi.  Normal respiratory effort. Abdomen:  soft, NT, ND Skin:  no rash or induration seen on limited exam Musculoskeletal:  grossly normal tone BUE/BLE, good ROM, no bony abnormality Psychiatric:  blunted mood and affect, speech fluent and appropriate Neurologic:  CN 2-12 grossly intact, moves all extremities in coordinated fashion, reported ataxia/dizziness with walking  Data Reviewed: I have reviewed the patient's lab results since admission.  Pertinent labs for today include:   K+ 3.2 Glucose 145 BUN 12/Creatinine 1.31/GFR 41; 11/1.09/56 on 8/16 WBC 2.8 Hgb 8.8, down from 9.8 UA: large Hgb, 5 ketones, trace LE, 100 protein, many bacteria    Family Communication: Husband was present throughout evaluation  Disposition: Status is: Inpatient Remains inpatient appropriate because: ongoing evaluation/treatment  Planned Discharge Destination:  TBD    Time spent: 50 minutes  Author: Jonah Blue, MD 05/07/2023 6:30 PM  For on call review [email or web address].

## 2023-05-07 NOTE — Progress Notes (Signed)
Inpatient Rehab Admissions Coordinator:   Note mobilizing well with mobility tech this AM, and limited OT session this PM.  She appears to be progressing rapidly and may not require CIR stay.  Will f/u tomorrow after therapy session.    Estill Dooms, PT, DPT Admissions Coordinator [phone] 05/07/23  4:48 PM

## 2023-05-07 NOTE — Progress Notes (Signed)
Mobility Specialist - Progress Note   05/07/23 1038  Mobility  Activity Ambulated with assistance in hallway;Transferred to/from [facility]  Level of Assistance Contact guard assist, steadying assist  Assistive Device Front wheel walker  Distance Ambulated (ft) 80 ft  Activity Response Tolerated well  Mobility Referral Yes  $Mobility charge 1 Mobility  Mobility Specialist Start Time (ACUTE ONLY) 1020  Mobility Specialist Stop Time (ACUTE ONLY) 1038  Mobility Specialist Time Calculation (min) (ACUTE ONLY) 18 min   Pt received in bed and agreeable to mobility. Prior to ambulating pt requested assistance to [facility] for BM. No complaints during session. Pt to bed after session with all needs met.   [facility]

## 2023-05-07 NOTE — Progress Notes (Signed)
Occupational Therapy Treatment Patient Details Name: Virginia [name] MRN: [id number] DOB: Jan 26, 1956 Today's Date: 05/07/2023   History of present illness 67 yo female admitted with AKI, metabolic encephalopathy, weakness. Hx of NSCLC with mets, COPD, radiation necrosis, chronic dizziness. anemia, falls.   OT comments  Patient is making slow progress towards goals and is motivated. Patients husband was present in room during session. Patient was noted to have decreased functional activity tolerance, decreased endurance, decreased standing balance, decreased safety awareness, and decreased knowledge of AD/AE impacting participation in ADLs. Session was impacted with MD entering room to speak with patient. Patient would continue to benefit from skilled OT services at this time while admitted and after d/c to address noted deficits in order to improve overall safety and independence in ADLs.         If plan is discharge home, recommend the following:  A lot of help with walking and/or transfers;Two people to help with walking and/or transfers;A lot of help with bathing/dressing/bathroom;Help with stairs or ramp for entrance;Direct supervision/assist for medications management;Direct supervision/assist for financial management   Equipment Recommendations  None recommended by OT       Precautions / Restrictions Precautions Precautions: Fall Restrictions Weight Bearing Restrictions: No       Mobility Bed Mobility Overal bed mobility: Needs Assistance Bed Mobility: Supine to Sit, Sit to Supine     Supine to sit: Contact guard, HOB elevated, Used rails Sit to supine: Contact guard assist, HOB elevated, Used rails                  Balance Overall balance assessment: Needs assistance Sitting-balance support: No upper extremity supported, Feet supported Sitting balance-Leahy Scale: Fair     Standing balance support: Bilateral upper extremity supported, During functional  activity Standing balance-Leahy Scale: Poor             ADL either performed or assessed with clinical judgement   ADL Overall ADL's : Needs assistance/impaired       Lower Body Dressing Details (indicate cue type and reason): able to figure four in bed to don socks. Toilet Transfer: Moderate assistance;Stand-pivot;Rolling walker (2 wheels);BSC/3in1 Toilet Transfer Details (indicate cue type and reason): assist to stabilize BSC, manuever RW and maintain balance. cues for hand placement, noted L UE incoordination on RW and BLE incoordination stepping Toileting- Clothing Manipulation and Hygiene: Moderate assistance;Sitting/lateral lean;Sit to/from stand Toileting - Clothing Manipulation Details (indicate cue type and reason): assisting with clothing mgmt but requires assist for hygiene in standing due to inability to reach on [facility]              Cognition Arousal: Lethargic Behavior During Therapy: [facility] for tasks assessed/performed Overall Cognitive Status: History of cognitive impairments - at baseline Area of Impairment: Memory, Following commands           Memory: Decreased short-term memory Following Commands: Follows one step commands consistently       General Comments: husband was in room during session                   Pertinent Vitals/ Pain       Pain Assessment Pain Assessment: Faces Faces Pain Scale: Hurts a little bit Pain Location: bottom         Frequency  Min 1X/week        Progress Toward Goals  OT Goals(current goals can now be found in the care plan section)  Progress towards OT goals: Progressing toward goals  Plan         AM-PAC OT "6 Clicks" Daily Activity     Outcome Measure   Help from another person eating meals?: A Little Help from another person taking care of personal grooming?: A Little Help from another person toileting, which includes using toliet, bedpan, or urinal?: A Lot Help from another person bathing  (including washing, rinsing, drying)?: A Lot Help from another person to put on and taking off regular upper body clothing?: A Little Help from another person to put on and taking off regular lower body clothing?: A Lot 6 Click Score: 15    End of Session Equipment Utilized During Treatment: Gait belt;Rolling walker (2 wheels)  OT Visit Diagnosis: Unsteadiness on feet (R26.81);Other abnormalities of gait and mobility (R26.89);Muscle weakness (generalized) (M62.81);Other symptoms and signs involving cognitive function   Activity Tolerance Patient tolerated treatment well   Patient Left in bed;with call bell/phone within reach;with bed alarm set;with family/visitor present;Other (comment) (MD in room)   Nurse Communication Mobility status        Time: [phone] OT Time Calculation (min): 29 min  Charges: OT General Charges $OT Visit: 1 Visit OT Treatments $Self Care/Home Management : 23-37 mins  Rosalio Loud, MS Acute Rehabilitation Department Office# [phone]   Selinda Flavin 05/07/2023, 1:07 PM

## 2023-05-08 DIAGNOSIS — N179 Acute kidney failure, unspecified: Secondary | ICD-10-CM | POA: Diagnosis not present

## 2023-05-08 DIAGNOSIS — G893 Neoplasm related pain (acute) (chronic): Secondary | ICD-10-CM | POA: Diagnosis not present

## 2023-05-08 DIAGNOSIS — Z7189 Other specified counseling: Secondary | ICD-10-CM

## 2023-05-08 DIAGNOSIS — R531 Weakness: Secondary | ICD-10-CM | POA: Diagnosis not present

## 2023-05-08 DIAGNOSIS — J449 Chronic obstructive pulmonary disease, unspecified: Secondary | ICD-10-CM | POA: Diagnosis not present

## 2023-05-08 DIAGNOSIS — Z515 Encounter for palliative care: Secondary | ICD-10-CM | POA: Diagnosis not present

## 2023-05-08 DIAGNOSIS — R5381 Other malaise: Secondary | ICD-10-CM

## 2023-05-08 LAB — CBC WITH DIFFERENTIAL/PLATELET
Abs Immature Granulocytes: 0.02 10*3/uL (ref 0.00–0.07)
Basophils Absolute: 0 10*3/uL (ref 0.0–0.1)
Basophils Relative: 0 %
Eosinophils Absolute: 0 10*3/uL (ref 0.0–0.5)
Eosinophils Relative: 0 %
HCT: 29.3 % — ABNORMAL LOW (ref 36.0–46.0)
Hemoglobin: 10 g/dL — ABNORMAL LOW (ref 12.0–15.0)
Immature Granulocytes: 0 %
Lymphocytes Relative: 17 %
Lymphs Abs: 1 10*3/uL (ref 0.7–4.0)
MCH: 32.8 pg (ref 26.0–34.0)
MCHC: 34.1 g/dL (ref 30.0–36.0)
MCV: 96.1 fL (ref 80.0–100.0)
Monocytes Absolute: 0.9 10*3/uL (ref 0.1–1.0)
Monocytes Relative: 15 %
Neutro Abs: 4 10*3/uL (ref 1.7–7.7)
Neutrophils Relative %: 68 %
Platelets: 225 10*3/uL (ref 150–400)
RBC: 3.05 MIL/uL — ABNORMAL LOW (ref 3.87–5.11)
RDW: 13.5 % (ref 11.5–15.5)
WBC: 5.9 10*3/uL (ref 4.0–10.5)
nRBC: 0 % (ref 0.0–0.2)

## 2023-05-08 LAB — BASIC METABOLIC PANEL
Anion gap: 8 (ref 5–15)
BUN: 15 mg/dL (ref 8–23)
CO2: 24 mmol/L (ref 22–32)
Calcium: 8.3 mg/dL — ABNORMAL LOW (ref 8.9–10.3)
Chloride: 109 mmol/L (ref 98–111)
Creatinine, Ser: 1.41 mg/dL — ABNORMAL HIGH (ref 0.44–1.00)
GFR, Estimated: 41 mL/min — ABNORMAL LOW (ref >60)
Glucose, Bld: 141 mg/dL — ABNORMAL HIGH (ref 70–99)
Potassium: 3.7 mmol/L (ref 3.5–5.1)
Sodium: 141 mmol/L (ref 135–145)

## 2023-05-08 LAB — CULTURE, BLOOD (ROUTINE X 2)
Culture: NO GROWTH
Special Requests: ADEQUATE

## 2023-05-08 NOTE — Plan of Care (Signed)

## 2023-05-08 NOTE — Hospital Course (Signed)
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 She was found to have AKI associated with dehydration. Brain MRI with no acute changes superimposed on known metastatic disease, mild to moderate increase in R parietal region vasogenic edema. Dr. Barbaraann Cao consulted and recommended resuming Decadron with plan to review case at CNS Tumor Board and f/o MRI in 2 weeks.

## 2023-05-08 NOTE — Progress Notes (Signed)
Physical Therapy Treatment Patient Details Name: Virginia [name] MRN: [id number] DOB: 29-Jul-1956 Today's Date: 05/08/2023   History of Present Illness 67 yo female admitted with AKI, metabolic encephalopathy, weakness. Hx of NSCLC with mets, COPD, radiation necrosis, chronic dizziness. anemia, falls.    PT Comments  Pt c/o chronic dizziness and nausea complaints. Pt tolerates increased ambulation this session, needing seated rest break and min A due to high fall risk with poor RW management and unsteadiness. Pt needing verbal cues and assist with transfers from EOB and recliner, cues for hand placement and weight shift anterior with flat foot posture. Pt needing cues for body positioning in RW and min A to steady with gait, one minor LOB needing assist to recover. Pt becomes very nauseas with 2nd ambulation bout, needing to lean on wall while nurse tech brought recliner for seated rest; VSS. Will continue to progress pt as able.   If plan is discharge home, recommend the following: Assistance with cooking/housework;Assist for transportation;Help with stairs or ramp for entrance;A lot of help with walking and/or transfers;A lot of help with bathing/dressing/bathroom   Can travel by private vehicle     Yes  Equipment Recommendations  None recommended by PT    Recommendations for Other Services       Precautions / Restrictions Precautions Precautions: Fall Restrictions Weight Bearing Restrictions: No     Mobility  Bed Mobility Overal bed mobility: Needs Assistance Bed Mobility: Supine to Sit     Supine to sit: Supervision, HOB elevated, Used rails     General bed mobility comments: increased time and effort to sit EOB, using bedrails and elevated bed to assist    Transfers Overall transfer level: Needs assistance Equipment used: Rolling walker (2 wheels) Transfers: Sit to/from Stand Sit to Stand: Min assist           General transfer comment: verbal cues for hand  placement to power up, verbal cues to shift weight anterior and maintain flat feet on floor, min A to power up and steady    Ambulation/Gait Ambulation/Gait assistance: Min assist Gait Distance (Feet): 60 Feet (+40) Assistive device: Rolling walker (2 wheels) Gait Pattern/deviations: Step-to pattern, Decreased stride length, Wide base of support Gait velocity: decreased     General Gait Details: slow step to gait pattern, wide BOS, verbal cues for body positioning in RW frame, pt releases BUE from RW to wipe hands on gown and has minor LOB posterior needing assist to recover   Stairs             Wheelchair Mobility     Tilt Bed    Modified Rankin (Stroke Patients Only)       Balance Overall balance assessment: Needs assistance Sitting-balance support: Feet supported, Single extremity supported Sitting balance-Leahy Scale: Fair     Standing balance support: Bilateral upper extremity supported, During functional activity, Reliant on assistive device for balance Standing balance-Leahy Scale: Poor                              Cognition Arousal: Alert, Lethargic Behavior During Therapy: WFL for tasks assessed/performed Overall Cognitive Status: History of cognitive impairments - at baseline                                 General Comments: pt initially lethargic, awakens more with loud phone ringing in room and appears to  stay alert, follows commands        Exercises      General Comments General comments (skin integrity, edema, etc.): Pt on RA with SpO2 99-95%, HR 90s-110      Pertinent Vitals/Pain Pain Assessment Pain Assessment: No/denies pain    Home Living                          Prior Function            PT Goals (current goals can now be found in the care plan section) Acute Rehab PT Goals Patient Stated Goal: none stated by pat PT Goal Formulation: Patient unable to participate in goal setting Time For  Goal Achievement: 05/18/23 Potential to Achieve Goals: Fair Progress towards PT goals: Progressing toward goals    Frequency    Min 1X/week      PT Plan      Co-evaluation              AM-PAC PT "6 Clicks" Mobility   Outcome Measure  Help needed turning from your back to your side while in a flat bed without using bedrails?: A Little Help needed moving from lying on your back to sitting on the side of a flat bed without using bedrails?: A Little Help needed moving to and from a bed to a chair (including a wheelchair)?: A Little Help needed standing up from a chair using your arms (e.g., wheelchair or bedside chair)?: A Little Help needed to walk in hospital room?: A Little Help needed climbing 3-5 steps with a railing? : A Lot 6 Click Score: 17    End of Session Equipment Utilized During Treatment: Gait belt Activity Tolerance: Patient tolerated treatment well Patient left: in chair;with call bell/phone within reach;with chair alarm set;with nursing/sitter in room Nurse Communication: Mobility status PT Visit Diagnosis: Muscle weakness (generalized) (M62.81);Difficulty in walking, not elsewhere classified (R26.2)     Time: 1610-9604 PT Time Calculation (min) (ACUTE ONLY): 25 min  Charges:    $Gait Training: 8-22 mins $Therapeutic Activity: 8-22 mins PT General Charges $$ ACUTE PT VISIT: 1 Visit                     Tori Ulysees Robarts PT, DPT 05/08/23, 12:46 PM

## 2023-05-08 NOTE — Progress Notes (Signed)
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 She was found to have AKI associated with dehydration. Brain MRI with no acute changes superimposed on known metastatic disease, mild to moderate increase in R parietal region vasogenic edema. Dr. Barbaraann Cao consulted and recommended resuming Decadron with plan to review case at CNS Tumor Board and f/o MRI in 2 weeks.   Assessment and Plan:  Acute kidney injury On presentation, likely prerenal in the setting of dehydration. Baseline serum creatinine 0.86 presented with creatinine 1.6, stabilized but not back to baseline IV fluid hydration   Follow BMP   Avoid nephrotoxic medications   Generalized weakness: > multifactorial Thought to be due to symptomatic anemia on presentation and transfused 1 unit PRBC She remains significantly dizzy continuously, including at rest; slightly better after initiation of Decadron She is having difficulty participating in therapy due to dizziness PT and OT consulting   Symptomatic anemia Likely secondary to hematuria Transfused 1 unit PRBC Hgb stable Continue to follow CBC   Intermittent confusion/decreased ability to control right extremity CT head without new or progressive abnormality MRI brain: No acute findings superimposed on patient's cerebral metastatic disease. No overt evidence of infection.  Blood cultures x 1 negative x 4 days   Hematuria Without evidence of infection Urology consult needed if hematuria recurs Consider CT A/P with contrast when creatinine improved after hydration. SCDs DVT prophylaxis   Hypoalbuminemia Patient's albumin level steadily decreasing, 2.2 01/10/2023 albumin  3.6.  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 Barbaraann Cao consulted on 8/26, recommended resuming Decadron BID Will review case at CNS tumor board Will f/u in 2 weeks with contrast,enhanced MRI study for review Palliative care consulted   Hypertension Continue Norvasc    Anxiety Continue Xanax, Zyprexa.       Consultants:   Neuro Oncology Palliative care PT/OT CIR   Procedures: None   Antibiotics: None     30 Day Unplanned Readmission Risk Score    Flowsheet Row ED to Hosp-Admission (Current) from 05/03/2023 in [place] [facility]  30 Day Unplanned Readmission Risk Score (%) 23.83 Filed at 05/08/2023 0801       This score is the patient's risk of an unplanned readmission within 30 days of being discharged (0 -100%). The score is based on dignosis, age, lab data, medications, orders, and past utilization.   Low:  0-14.9   Medium: 15-21.9   High: 22-29.9   Extreme: 30 and above           Subjective: Maybe mildly improved dizziness today.  Still trying to get more mobile with the hopes that CIR will be a consideration.   Objective: Vitals:   05/07/23 2101 05/08/23 0547  BP: 112/70 128/82  Pulse: 83 78  Resp: 14 14  Temp: 98 F (36.7 C) 97.6 F (36.4 C)  SpO2: 99% 96%   No intake or output data in the 24 hours ending 05/08/23 0905 There were no vitals filed for this visit.  Exam:  General:  Appears somnolent, frail Eyes:  EOMI, normal lids, iris ENT:  grossly normal hearing, lips & tongue, mmm Neck:  no LAD, masses or thyromegaly Cardiovascular:  RRR, no m/r/g. No LE edema.  Respiratory:   CTA bilaterally with no wheezes/rales/rhonchi.  Normal respiratory effort. Abdomen:  soft, NT, ND Skin:  no rash or  induration seen on limited exam Musculoskeletal:  grossly normal tone BUE/BLE, good ROM, no bony abnormality Psychiatric:  blunted mood and affect, speech fluent and appropriate Neurologic:  CN 2-12 grossly intact, moves all extremities in coordinated fashion, reported dizziness/nausea with walking  Data Reviewed: I have reviewed the patient's lab results since admission.  Pertinent labs for today include:  Glucose 141 BUN 15/Creatinine 1.41/GFR 41 - stable Stable CBC     Family Communication: None present today  Disposition: Status is: Inpatient Remains inpatient appropriate because: unsafe disposition  Planned Discharge Destination:  TBD    Time spent: 50 minutes  Author: Jonah Blue, MD 05/08/2023 9:04 AM  For on call review [email or web address].

## 2023-05-08 NOTE — Progress Notes (Signed)
Inpatient Rehab Coordinator Note:  I spoke with patient and her spouse over the phone to discuss CIR recommendations and goals/expectations of CIR stay.  We reviewed 3 hrs/day of therapy, physician follow up, and average length of stay 2 weeks (dependent upon progress) with goals of supervision to mod I.  Spouse reports he assisted pt to bathroom today with supervision only (no hands on).  We discussed possibility of direct d/c home with HH f/u. She would like to see how the steroids continue to work as she feels she's seen great improvement.  I will f/u with her tomorrow.    Estill Dooms, PT, DPT Admissions Coordinator [phone] 05/08/23  2:03 PM

## 2023-05-08 NOTE — Consult Note (Signed)
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  During hospitalization patient has been managed for AKI associated with dehydration.  Brain MRI has not shown acute changes superimposed on known metastatic disease, mild to moderate increase in right parietal region vasogenic edema though.  Dr. Barbaraann Cao consulted and provided recommendations.  Plan is for Dr. Barbaraann Cao to discuss patient's care with CNS tumor board and follow-up MRI in 2 weeks.  Palliative medicine team consulted to assist with complex medical decision making.  Extensive review of EMR prior to presenting to bedside.  In regards to medication patient is on dexamethasone 4 mg twice daily scheduled, olanzapine 2 mg nightly scheduled, OxyContin 10 mg every 12 hours scheduled, and oxycodone 5 mg every 3 hours as needed x 2 doses in the past 24 hours.  Presented to bedside to meet with patient.  No family present during visit today.  Patient laying comfortably in bed.  Introduced myself and role of the palliative medicine team in patient's care.  Patient noted she is very tired at this time and wants to be able to rest.  Patient does state that her pain is currently well-managed on her opioid regimen.  Patient feels that she is regaining strength by the day and hopes to continue to do so to continue discussions about cancer directed therapies moving forward.  Spent time providing  emotional support via active listening.  Noted palliative medicine team will continue to follow along with patient's medical journey.  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)  Malignant neoplasm metastatic to brain (HCC)  Metastasis to brain [facility])  Malignant neoplasm metastatic to bone (HCC)  Hypertension  Hyperlipidemia  COPD (chronic obstructive pulmonary disease) (HCC)  Anemia   Palliative Review of Systems: Pain improved  Past Medical History:  Diagnosis Date   Anemia    Anxiety    Concussion 09/28/2019   DVT (deep venous thrombosis) (HCC) 2021   L leg   Dyspnea    GERD (gastroesophageal reflux disease)    Hypercholesterolemia    per pt, she does not have elevated lipids   Hypertension    met lung ca dx'd 09/2019   mets to spine, hip and brain   PONV (postoperative nausea and vomiting)    Tobacco abuse    Social History   Socioeconomic History   Marital status: Married    Spouse name: Not on file   Number of children: 3   Years of education: Not on file   Highest education level: Not on file  Occupational History   Occupation: owns Editor, [employer]ing [employer]    Employer: [employer]  Tobacco Use   Smoking status: Former    Current packs/day: 0.00    Types: Cigarettes    Quit date: 10/31/2012    Years since quitting: 10.5   Smokeless tobacco: Never  Vaping Use  Vaping status: Never Used  Substance and Sexual Activity   Alcohol use: Yes    Alcohol/week: 0.0 standard drinks of alcohol    Comment: rare   Drug use: No   Sexual activity: Yes  Other Topics Concern   Not on file  Social History Narrative   Not on file   Social Determinants of Health   Financial Resource Strain: Not on file  Food Insecurity: No Food Insecurity (05/04/2023)   Hunger Vital Sign    Worried About Running Out of Food in the Last Year: Never true    Ran Out of Food in the  Last Year: Never true  Transportation Needs: No Transportation Needs (05/04/2023)   PRAPARE - Administrator, Civil Service (Medical): No    Lack of Transportation (Non-Medical): No  Physical Activity: Not on file  Stress: Not on file  Social Connections: Not on file   Family History  Problem Relation Age of Onset   Heart disease Father    Drug abuse Daughter    Drug abuse Son    Cancer Sister    Heart disease Brother    Heart attack Brother    Scheduled Meds:  sodium chloride   Intravenous Once   amLODipine  10 mg Oral Daily   dexamethasone  4 mg Oral Q12H   feeding supplement  237 mL Oral BID BM   folic acid  1 mg Oral Daily   OLANZapine  2.5 mg Oral QHS   oxyCODONE  10 mg Oral Q12H   pantoprazole  40 mg Oral Daily   polyethylene glycol  17 g Oral QPM   potassium chloride  40 mEq Oral Daily   protein supplement  1 Scoop Oral TID WC   Continuous Infusions: PRN Meds:.acetaminophen **OR** acetaminophen, albuterol, ALPRAZolam, meclizine, naphazoline-pheniramine, oxyCODONE, senna-docusate No Known Allergies CBC:    Component Value Date/Time   WBC 5.9 05/08/2023 0605   HGB 10.0 (L) 05/08/2023 0605   HGB 11.4 (L) 04/26/2023 1228   HCT 29.3 (L) 05/08/2023 0605   PLT 225 05/08/2023 0605   PLT 392 04/26/2023 1228   MCV 96.1 05/08/2023 0605   NEUTROABS 4.0 05/08/2023 0605   LYMPHSABS 1.0 05/08/2023 0605   MONOABS 0.9 05/08/2023 0605   EOSABS 0.0 05/08/2023 0605   BASOSABS 0.0 05/08/2023 0605   Comprehensive Metabolic Panel:    Component Value Date/Time   NA 141 05/08/2023 0605   K 3.7 05/08/2023 0605   CL 109 05/08/2023 0605   CO2 24 05/08/2023 0605   BUN 15 05/08/2023 0605   CREATININE 1.41 (H) 05/08/2023 0605   CREATININE 1.09 (H) 04/26/2023 1228   CREATININE 0.58 11/15/2017 0948   GLUCOSE 141 (H) 05/08/2023 0605   CALCIUM 8.3 (L) 05/08/2023 0605   AST 16 05/07/2023 0606   AST 18 04/26/2023 1228   ALT 11 05/07/2023 0606   ALT 8 04/26/2023 1228    ALKPHOS 47 05/07/2023 0606   BILITOT 0.6 05/07/2023 0606   BILITOT 0.4 04/26/2023 1228   PROT 5.7 (L) 05/07/2023 0606   ALBUMIN 1.9 (L) 05/07/2023 0606    Physical Exam: Vital Signs: BP 128/82 (BP Location: Right Arm)   Pulse 78   Temp 97.6 F (36.4 C) (Oral)   Resp 14   SpO2 96%  SpO2: SpO2: 96 % O2 Device: O2 Device: Room Air O2 Flow Rate: O2 Flow Rate (L/min): 2 L/min Intake/output summary: No intake or output data in the 24 hours ending 05/08/23 1016 LBM: Last BM  Date : 05/07/23 Baseline Weight:   Most recent weight:    General: NAD, awake, comfortably laying in bed Eyes: No drainage noted HENT: moist mucous membranes Cardiovascular: RRR Respiratory: no increased work of breathing noted, not in respiratory distress Neuro: Awake, interactive, following commands easily          Palliative Performance Scale: 50%              Additional Data Reviewed: Recent Labs    05/07/23 0606 05/08/23 0605  WBC 2.8* 5.9  HGB 8.8* 10.0*  PLT 160 225  NA 137 141  BUN 12 15  CREATININE 1.41* 1.41*    Imaging: MR BRAIN WO CONTRAST CLINICAL DATA:  Brain metastases with neurologic changes.  EXAM: MRI HEAD WITHOUT CONTRAST  TECHNIQUE: Multiplanar, multiecho pulse sequences of the brain and surrounding structures were obtained without intravenous contrast.  COMPARISON:  04/01/2023  FINDINGS: Brain: Known metastatic disease, underestimated relative to prior brain MRI in the absence of IV contrast. Fairly extensive vasogenic edema in the bilateral cerebellum, especially in the mid and superior portions. There has been a mild increase in edema at the right parietal lesion, although no increase in mass effect. Borderline increased vasogenic edema in the anterior left frontal lobe. No indication of acute hemorrhage, hydrocephalus, or collection.  Vascular: Major flow voids are preserved  Skull and upper cervical spine: Curved left occipital craniotomy site is  unremarkable.  Sinuses/Orbits: Negative.  IMPRESSION: 1. No acute finding superimposed on the patient's cerebral metastatic disease. 2. Mild-to-moderate increase in vasogenic edema centered on a right parietal lesion. Fairly extensive cerebellar edema is unchanged. No hydrocephalus or shift.  Electronically Signed   By: Tiburcio Pea M.D.   On: 05/04/2023 12:03    I personally reviewed recent imaging.   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  During hospitalization patient has been managed for AKI associated with dehydration.  Brain MRI has not shown acute changes superimposed on known metastatic disease, mild to moderate increase in right parietal region vasogenic edema though.  Dr. Barbaraann Cao consulted and provided recommendations.  Plan is for Dr. Barbaraann Cao to discuss patient's care with CNS tumor board and follow-up MRI in 2 weeks.  Palliative medicine team consulted to assist with complex medical decision making.  # Complex medical decision making/goals of care  -Patient currently working with physical therapy to regain strength.  Plan is for patient to follow-up with Dr. Barbaraann Cao in a couple of weeks for repeat MRI and to allow him time to discuss patient's case with CNS tumor board.  Patient hopeful to continue regaining strength at this time and continue discussions with oncology moving forward.  -Patient does have advanced directive on file from 04/16/2022 naming HCPOA, primary alternate agent, and wishes regarding medical care.  -  Code Status: Full Code   # Symptom management  -Pain, acute on chronic in  setting of metastatic non-small cell lung cancer with disease to brain and bone   -Continue OxyContin 10 mg every 12 hours scheduled   -Continue oxycodone 5 mg every 3 hours as needed   -Continue dexamethasone 4 mg twice daily as per Dr. Liana Gerold recommendations  # Discharge  Planning:  To Be Determined  -Would recommend consideration of referral to outpatient palliative medicine team at [facility] if appropriate at time of discharge.  Thank you for allowing the palliative care team to participate in the care Virginia [name].  Alvester Morin, DO Palliative Care Provider PMT # [phone]  If patient remains symptomatic despite maximum doses, please call PMT at [phone] between 0700 and 1900. Outside of these hours, please call attending, as PMT does not have night coverage.  This provider spent a total of 67 minutes providing patient's care.  Includes review of EMR, discussing care with other staff members involved in patient's medical care, obtaining relevant history and information from patient and/or patient's family, and personal review of imaging and lab work. Greater than 50% of the time was spent counseling and coordinating care related to the above assessment and plan.   *Please note that this is a verbal dictation therefore any spelling or grammatical errors are due to the "Dragon Medical One" system interpretation.

## 2023-05-08 NOTE — TOC Initial Note (Signed)
Transition of Care [facility]) - Initial/Assessment Note    Patient Details  Name: Virginia [name] MRN: [id number] Date of Birth: 11-04-1955  Transition of Care [facility]) CM/SW Contact:    Beckie Busing, RN Phone Number:[phone]  05/08/2023, 2:58 PM  Clinical Narrative:                 TOC following patient with high risk for readmission. Patient is from home with spouse. Spouse at bedside assist with assessment questions. Patient is from home where she functions independently. Per spouse patient has no DME. Patient does have PCP (Baxley, Luanna Cole, MD ) and patient does follow up on a regular basis. Patient has access to medications and medications are affordable. CM inquiring about disposition planning. Patient and spouse are not aware of disposition planning. CM spoke with MD who states that disposition may be CIR versus Hospice but no definite plan. TOC will continue to follow for any disposition needs.   Expected Discharge Plan: Acute to Acute Transfer (Acute rehab versus hospice per MD) Barriers to Discharge: Continued Medical Work up   Patient Goals and CMS Choice Patient states their goals for this hospitalization and ongoing recovery are:: Wants to be able to return home CMS Medicare.gov Compare Post Acute Care list provided to::  (n/a) Choice offered to / list presented to :  (on hold per MD) [place] ownership interest in [facility].provided to::  (n/a)    Expected Discharge Plan and Services In-house Referral: Hospice / Palliative Care Discharge Planning Services: CM Consult Post Acute Care Choice: NA Living arrangements for the past 2 months: Single Family Home                 DME Arranged: N/A DME Agency: NA       HH Arranged: NA HH Agency: NA        Prior Living Arrangements/Services Living arrangements for the past 2 months: Single Family Home Lives with:: Spouse Patient language and need for interpreter reviewed:: Yes Do you feel safe going back to  the place where you live?: Yes      Need for Family Participation in Patient Care: Yes (Comment)   Current home services:  (n/a) Criminal Activity/Legal Involvement Pertinent to Current Situation/Hospitalization: No - Comment as needed  Activities of Daily Living Home Assistive Devices/Equipment: Environmental consultant (specify type), Eyeglasses ADL Screening (condition at time of admission) Patient's cognitive ability adequate to safely complete daily activities?: Yes Is the patient deaf or have difficulty hearing?: No Does the patient have difficulty seeing, even when wearing glasses/contacts?: No Does the patient have difficulty concentrating, remembering, or making decisions?: Yes Patient able to express need for assistance with ADLs?: Yes Does the patient have difficulty dressing or bathing?: No Independently performs ADLs?: No Communication: Independent Dressing (OT): Needs assistance Is this a change from baseline?: Change from baseline, expected to last <3days Grooming: Needs assistance Is this a change from baseline?: Change from baseline, expected to last <3 days Feeding: Independent Bathing: Needs assistance Is this a change from baseline?: Change from baseline, expected to last <3 days Toileting: Needs assistance Is this a change from baseline?: Change from baseline, expected to last <3 days In/Out Bed: Needs assistance Is this a change from baseline?: Change from baseline, expected to last <3 days Walks in Home: Needs assistance Is this a change from baseline?: Change from baseline, expected to last <3 days Does the patient have difficulty walking or climbing stairs?: Yes Weakness of Legs: Both Weakness of  Arms/Hands: None  Permission Sought/Granted Permission sought to share information with : Family Supports    Share Information with NAME: Eren Ricciuti     Permission granted to share info w Relationship: spouse  Permission granted to share info w Contact Information:  [phone]  Emotional Assessment Appearance:: Appears stated age Attitude/Demeanor/Rapport: Engaged Affect (typically observed): Accepting, Pleasant Orientation: : Oriented to Self, Oriented to Place, Oriented to Situation Alcohol / Substance Use: Not Applicable Psych Involvement: No (comment)  Admission diagnosis:  Weakness [R53.1] AKI (acute kidney injury) (HCC) [N17.9] Patient Active Problem List   Diagnosis Date Noted   Weakness 05/08/2023   Counseling and coordination of care 05/08/2023   AKI (acute kidney injury) (HCC) 05/03/2023   Anemia 05/03/2023   Cancer associated pain 11/14/2021   Rash 11/14/2021   Hyponatremia 11/14/2021   Skin rash 11/13/2021   Hypokalemia 11/13/2021   Febrile illness 11/13/2021   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   Port-A-Cath in place 12/15/2020   Metastasis to brain (HCC) 12/02/2020   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) 03/15/2020   Goals of care, counseling/discussion 02/25/2020   Palliative care patient 12/16/2019   Malignant neoplasm metastatic to bone (HCC) 10/21/2019   Malignant neoplasm metastatic to brain (HCC) 10/15/2019   Right lower lobe lung mass 09/25/2019   COPD (chronic obstructive pulmonary disease) (HCC) 09/25/2019   Impaired glucose tolerance 12/08/2017   Vitamin D deficiency 12/08/2017   Microscopic hematuria 04/01/2014   Hypertension 01/11/2012   Palpitations 01/11/2012   Hyperlipidemia 12/25/2011   History of smoking 12/25/2011   PCP:  [name], MD Pharmacy:   [facility] [address] [place] [address] Phone: [phone] Fax: [phone]  [id number] - [place], [place] - [address] AT [facility] [address] [facility] [address] [place] [address] Phone: [phone] Fax: [phone]     Social Determinants of Health (SDOH) Social History: SDOH Screenings   Food Insecurity: No Food  Insecurity (05/04/2023)  Housing: Low Risk  (05/04/2023)  Transportation Needs: No Transportation Needs (05/04/2023)  Utilities: Not At Risk (05/04/2023)  Depression (PHQ2-9): Low Risk  (11/29/2021)  Tobacco Use: Medium Risk (05/03/2023)   SDOH Interventions:     Readmission Risk Interventions    05/07/2023    1:35 PM  Readmission Risk Prevention Plan  Transportation Screening Complete  PCP or Specialist Appt within 3-5 Days Complete  HRI or Home Care Consult Complete  Social Work Consult for Recovery Care Planning/Counseling Complete  Palliative Care Screening Not Applicable  Medication Review Oceanographer) Complete

## 2023-05-09 DIAGNOSIS — Z66 Do not resuscitate: Secondary | ICD-10-CM

## 2023-05-09 DIAGNOSIS — C7931 Secondary malignant neoplasm of brain: Secondary | ICD-10-CM | POA: Diagnosis not present

## 2023-05-09 DIAGNOSIS — Z79899 Other long term (current) drug therapy: Secondary | ICD-10-CM

## 2023-05-09 DIAGNOSIS — R4589 Other symptoms and signs involving emotional state: Secondary | ICD-10-CM

## 2023-05-09 DIAGNOSIS — Z515 Encounter for palliative care: Secondary | ICD-10-CM

## 2023-05-09 DIAGNOSIS — C7951 Secondary malignant neoplasm of bone: Secondary | ICD-10-CM | POA: Diagnosis not present

## 2023-05-09 DIAGNOSIS — R110 Nausea: Secondary | ICD-10-CM

## 2023-05-09 DIAGNOSIS — Z7189 Other specified counseling: Secondary | ICD-10-CM | POA: Diagnosis not present

## 2023-05-09 LAB — CBC WITH DIFFERENTIAL/PLATELET
Abs Immature Granulocytes: 0.04 10*3/uL (ref 0.00–0.07)
Basophils Absolute: 0 10*3/uL (ref 0.0–0.1)
Basophils Relative: 0 %
Eosinophils Absolute: 0 10*3/uL (ref 0.0–0.5)
Eosinophils Relative: 0 %
HCT: 26 % — ABNORMAL LOW (ref 36.0–46.0)
Hemoglobin: 9 g/dL — ABNORMAL LOW (ref 12.0–15.0)
Immature Granulocytes: 1 %
Lymphocytes Relative: 18 %
Lymphs Abs: 1.2 10*3/uL (ref 0.7–4.0)
MCH: 33.1 pg (ref 26.0–34.0)
MCHC: 34.6 g/dL (ref 30.0–36.0)
MCV: 95.6 fL (ref 80.0–100.0)
Monocytes Absolute: 0.9 10*3/uL (ref 0.1–1.0)
Monocytes Relative: 14 %
Neutro Abs: 4.6 10*3/uL (ref 1.7–7.7)
Neutrophils Relative %: 67 %
Platelets: 241 10*3/uL (ref 150–400)
RBC: 2.72 MIL/uL — ABNORMAL LOW (ref 3.87–5.11)
RDW: 13.7 % (ref 11.5–15.5)
WBC: 6.8 10*3/uL (ref 4.0–10.5)
nRBC: 0.3 % — ABNORMAL HIGH (ref 0.0–0.2)

## 2023-05-09 LAB — BASIC METABOLIC PANEL
Anion gap: 7 (ref 5–15)
BUN: 22 mg/dL (ref 8–23)
CO2: 24 mmol/L (ref 22–32)
Calcium: 8.1 mg/dL — ABNORMAL LOW (ref 8.9–10.3)
Chloride: 106 mmol/L (ref 98–111)
Creatinine, Ser: 1.32 mg/dL — ABNORMAL HIGH (ref 0.44–1.00)
GFR, Estimated: 45 mL/min — ABNORMAL LOW (ref >60)
Glucose, Bld: 150 mg/dL — ABNORMAL HIGH (ref 70–99)
Potassium: 3.5 mmol/L (ref 3.5–5.1)
Sodium: 137 mmol/L (ref 135–145)

## 2023-05-09 MED ORDER — OLANZAPINE 5 MG PO TBDP
2.5000 mg | ORAL_TABLET | Freq: Every day | ORAL | Status: DC
  Administered 2023-05-09 – 2023-05-11 (×3): 2.5 mg via ORAL
  Filled 2023-05-09 (×3): qty 0.5

## 2023-05-09 MED ORDER — AMLODIPINE BESYLATE 10 MG PO TABS
10.0000 mg | ORAL_TABLET | Freq: Every day | ORAL | Status: DC
  Administered 2023-05-10: 10 mg via ORAL
  Filled 2023-05-09: qty 1

## 2023-05-09 MED ORDER — OLANZAPINE 5 MG PO TABS
5.0000 mg | ORAL_TABLET | Freq: Every day | ORAL | Status: DC
  Administered 2023-05-09 – 2023-05-10 (×2): 5 mg via ORAL
  Filled 2023-05-09 (×2): qty 1

## 2023-05-09 NOTE — Progress Notes (Signed)
Physical Therapy Treatment Patient Details Name: Virginia [name] MRN: [id number] DOB: 1956/08/11 Today's Date: 05/09/2023   History of Present Illness 67 yo female admitted with AKI, metabolic encephalopathy, weakness. Hx of NSCLC with mets, COPD, radiation necrosis, chronic dizziness. anemia, falls.    PT Comments  Pt seen for PT tx with pt's husband Harvie Heck) present for session. Session focused on caregiver training & d/c planning. PT educates pt & spouse on CIR vs HHPT. Pt & spouse eager to d/c home, planning to use hospital bed for first floor, sponge bathe, and use half bath downstairs. PT educated pt to use RW vs rollator & have assistance from spouse for safe ambulation. Pt ambulated with RW & husband providing +1 assistance. Pt presents with L inattention, decreased LUE/LLE neuromuscular control, decreased L hip/knee flexion during swing phase, decreased LLE dorsiflexion, & decreased heel strike. Pt & spouse both report comfort with pt's CLOF & level of assistance. Will continue to follow pt acutely to address strengthening, balance, gait with LRAD, & safety with mobility.    If plan is discharge home, recommend the following: Assistance with cooking/housework;Assist for transportation;Help with stairs or ramp for entrance;A little help with walking and/or transfers;A little help with bathing/dressing/bathroom   Can travel by private vehicle     Yes  Equipment Recommendations  Hospital bed;BSC/3in1    Recommendations for Other Services       Precautions / Restrictions Precautions Precautions: Fall Restrictions Weight Bearing Restrictions: No     Mobility  Bed Mobility Overal bed mobility: Needs Assistance Bed Mobility: Supine to Sit     Supine to sit: Supervision, HOB elevated, Used rails     General bed mobility comments: extra time, cuing to scoot L hip towards EOB    Transfers Overall transfer level: Needs assistance Equipment used: Rolling walker (2  wheels) Transfers: Sit to/from Stand Sit to Stand: Min assist           General transfer comment: STS from EOB with RW & cuing for hand placement (LUE on RW, push to standing with RUE)    Ambulation/Gait Ambulation/Gait assistance: Min assist, Contact guard assist Gait Distance (Feet): 80 Feet Assistive device: Rolling walker (2 wheels) Gait Pattern/deviations: Decreased step length - right, Decreased step length - left, Decreased dorsiflexion - left, Decreased stride length Gait velocity: decreased     General Gait Details: Pt ambulates to window & back (turning L out of room) with husband providing assistance, PT educating him to stand on L/posterior to pt.   Stairs Stairs:  (pt reports comfort with stair negotiation, noting spouse stands in front of her & provides BUE hand support as they do not have a rail to hold to; PT educated them to use gait belt when negotiating stairs as well as benefits of rail installation)           Wheelchair Mobility     Tilt Bed    Modified Rankin (Stroke Patients Only)       Balance Overall balance assessment: Needs assistance Sitting-balance support: Feet supported, Single extremity supported Sitting balance-Leahy Scale: Fair     Standing balance support: Bilateral upper extremity supported, During functional activity, Reliant on assistive device for balance Standing balance-Leahy Scale: Poor                              Cognition Arousal: Alert Behavior During Therapy: WFL for tasks assessed/performed Overall Cognitive Status: History of cognitive impairments -  at baseline Area of Impairment: Memory, Following commands, Safety/judgement                       Following Commands: Follows one step commands consistently Safety/Judgement: Decreased awareness of safety, Decreased awareness of deficits Awareness: Emergent            Exercises      General Comments General comments (skin integrity,  edema, etc.): Educated them on home safety modifications: remove/secure throw rugs, L inattention, need to use gait belt at all times for mobility      Pertinent Vitals/Pain Pain Assessment Pain Assessment: Faces Faces Pain Scale: Hurts little more Pain Location: low back Pain Descriptors / Indicators: Discomfort Pain Intervention(s): Monitored during session, Premedicated before session    Home Living                          Prior Function            PT Goals (current goals can now be found in the care plan section) Acute Rehab PT Goals Patient Stated Goal: go home PT Goal Formulation: With patient/family Time For Goal Achievement: 05/18/23 Potential to Achieve Goals: Fair Progress towards PT goals: Progressing toward goals    Frequency    Min 1X/week      PT Plan      Co-evaluation              AM-PAC PT "6 Clicks" Mobility   Outcome Measure  Help needed turning from your back to your side while in a flat bed without using bedrails?: A Little Help needed moving from lying on your back to sitting on the side of a flat bed without using bedrails?: A Little Help needed moving to and from a bed to a chair (including a wheelchair)?: A Little Help needed standing up from a chair using your arms (e.g., wheelchair or bedside chair)?: A Little Help needed to walk in hospital room?: A Little Help needed climbing 3-5 steps with a railing? : A Lot 6 Click Score: 17    End of Session Equipment Utilized During Treatment: Gait belt Activity Tolerance: Patient tolerated treatment well Patient left: with call bell/phone within reach;with family/visitor present (sitting EOB) Nurse Communication: Mobility status PT Visit Diagnosis: Muscle weakness (generalized) (M62.81);Difficulty in walking, not elsewhere classified (R26.2);Other abnormalities of gait and mobility (R26.89);Unsteadiness on feet (R26.81)     Time: 4098-1191 PT Time Calculation (min) (ACUTE  ONLY): 21 min  Charges:    $Therapeutic Activity: 8-22 mins PT General Charges $$ ACUTE PT VISIT: 1 Visit                     Aleda Grana, PT, DPT 05/09/23, 1:07 PM    Sandi Mariscal 05/09/2023, 1:03 PM

## 2023-05-09 NOTE — TOC Progression Note (Signed)
Transition of Care [facility]) - Progression Note    Patient Details  Name: Virginia [name] MRN: [id number] Date of Birth: Dec 30, 1955  Transition of Care [facility]) CM/SW Contact  Beckie Busing, RN Phone Number:[phone]  05/09/2023, 1:13 PM  Clinical Narrative:    Patient received message from PT, patient may be a candidate for CIR but is requesting to go home with [facility] and DME. CM at bedside to speak with patient and husband about disposition planning. Patient and husband state the plan is to go home with DME and home health. CM has offered choice for services. DME orders have been entered and called to [facility] referral has been submitted and accepted by Morrie Sheldon with [facility].    Expected Discharge Plan: Acute to Acute Transfer (Acute rehab versus hospice per MD) Barriers to Discharge: Continued Medical Work up  Expected Discharge Plan and Services In-house Referral: Hospice / Palliative Care Discharge Planning Services: CM Consult Post Acute Care Choice: NA Living arrangements for the past 2 months: Single Family Home                 DME Arranged: N/A DME Agency: NA       HH Arranged: NA HH Agency: NA         Social Determinants of Health (SDOH) Interventions SDOH Screenings   Food Insecurity: No Food Insecurity (05/04/2023)  Housing: Low Risk  (05/04/2023)  Transportation Needs: No Transportation Needs (05/04/2023)  Utilities: Not At Risk (05/04/2023)  Depression (PHQ2-9): Low Risk  (11/29/2021)  Tobacco Use: Medium Risk (05/03/2023)    Readmission Risk Interventions    05/07/2023    1:35 PM  Readmission Risk Prevention Plan  Transportation Screening Complete  PCP or Specialist Appt within 3-5 Days Complete  HRI or Home Care Consult Complete  Social Work Consult for Recovery Care Planning/Counseling Complete  Palliative Care Screening Not Applicable  Medication Review [employer]) Complete

## 2023-05-09 NOTE — Progress Notes (Signed)
    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   Bed type Semi-electric      05/09/23 1343

## 2023-05-09 NOTE — Progress Notes (Signed)
Daily Progress Note   Patient Name: Virginia [name]       Date: 05/09/2023 DOB: 1956-06-28  Age: 67 y.o. MRN#: [id number] Attending Physician: Jonah Blue, MD Primary Care Physician: [name], MD Admit Date: 05/03/2023 Length of Stay: 6 days  Reason for Consultation/Follow-up: Establishing goals of care  Subjective:   CC: Patient notes she is feeling "better" since she has been receiving steroids.  Following up regarding complex medical decision making.  Subjective:  Reviewed EMR prior to presenting to bedside.  When visiting with patient this morning, patient's husband present at bedside.  Again introduced myself as a member of the palliative medicine team.  Patient willing to engage in discussion today with husband present.  Spent time reviewing patient's medical course up into this point.  Husband described how he had been told on Sunday that patient was going to die and he needed to "let her go".  He had mentally prepared for this and so was wanting further information regarding hospice care.  He notes no that when Dr. Barbaraann Cao came by and he did "not agree with hospice yet" and recommended steroids instead.  Patient's mental status has "improved" since being started on steroids.  Patient feels that her mentation and coordination have improved with addition of steroids.  Patient notes that she is still "tired" and weak though when she was previously confused now she is able to walk up and down the hallway with a walker for a very short distance which is an improvement.  She is wanting to remain as functional as possible to take some of the care burden off of her husband.  With permission, then able to review possible pathways for medical care moving forward.  Discussed 1 pathway would include continuing with aggressive medical interventions such as PT/OT, further imaging, return to the hospital if needed, and plan to follow-up with Dr. Barbaraann Cao in a couple weeks when he wants to  obtain repeat MRI and to discuss patient's case at CNS tumor board.  At time answering questions as able regarding this.  Encouraged further discussions with Dr. Barbaraann Cao regarding specifics as noted it would be up to him about further directed therapies. Also able to discuss another pathway for medical care that include focusing on quality time at home with aggressive symptom management supported by hospice.  Patient and husband have had family members on hospice so discussed generalities of support provided.  Did explain that hospice does not have a provider present there 24/7 so care would still fall to family.  Introduced idea of inpatient hospice at family's request though again explained inpatient hospice is for specific patients who have symptoms that cannot be managed in the home setting and usually require IV medications for management.  At this point patient does not appear appropriate for inpatient hospice though this could rapidly change in the future.  Patient would be appropriate for hospice at home.  Spent time answering questions regarding this as able. Patient noting wanted to discuss with her husband further about which medical pathway they would like to pursue.  Encouraged open conversations regarding this.  Recommended if there be further questions they should speak to physical therapy about home health or CIR if patient is excepted or TOC for further information.  With permission, also able to review patient's CODE STATUS.  Explained full code versus DNR.  Patient and husband stated they thought patient was already DNR.  Once again explaining full code versus DNR, continue appropriate medical therapies, patient  electing to change CODE STATUS to DNR and has been supportive of this since they had previously discussed patient being DNR.  Noted would update augmentation in EMR to reflect this.  Also able to review patient's symptom burden at this time.  Patient continues to have nausea with lots  of movement.  Again patient feels that steroids have overall helped.  Patient feels that olanzapine has helped the most with her nausea management.  Patient husband stated she has been on the same dose of olanzapine at nighttime for at least a couple years.  Discussed adding daytime dose of olanzapine since patient not reporting any sleepiness associated with medication.  Also discussed patient's bedtime dose can be increased.  All questions answered at that time.  Thank patient and her husband for allowing me to visit with him today.  Noted palliative medicine team to continue following with patient's medical journey.  Discussed care with IDT  for updates.  Review of Systems Dizziness with nausea for years Objective:   Vital Signs:  BP 129/78 (BP Location: Right Arm)   Pulse 65   Temp 97.9 F (36.6 C) (Oral)   Resp 14   SpO2 96%   Physical Exam: General: NAD, awake, comfortably laying in bed Eyes: No drainage noted HENT: moist mucous membranes Cardiovascular: RRR Respiratory: no increased work of breathing noted, not in respiratory distress Neuro: Awake, interactive, following commands easily Psych: Pleasant  Imaging:  I personally reviewed recent imaging.   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 During hospitalization patient has been managed for AKI associated with dehydration. Brain MRI has not shown acute changes superimposed on known metastatic disease, mild to moderate increase in right parietal region vasogenic edema though. Dr. Barbaraann Cao consulted and provided recommendations. Plan is for Dr. Barbaraann Cao to discuss patient's care with CNS tumor board and follow-up MRI in 2  weeks. Palliative medicine team consulted to assist with complex medical decision making.   Recommendations/Plan: # Complex medical decision making/goals of care:  - Extensive discussion with patient and husband at bedside as detailed above in HPI.  Discussed possible pathways for medical care moving forward including continuing with aggressive medical interventions versus home with hospice.  Patient and has been going to consider possible pathways for medical care to determine what most appropriately fits with patient's quality of life.  -  Code Status: Limited: Do not attempt resuscitation (DNR) -DNR-LIMITED -Do Not Intubate/DNI    -Discussed CODE STATUS with patient and husband as detailed above in HPI.  Explained full code and DNR, continue appropriate medical therapies.  Patient electing for DNR CODE STATUS and has been supporting of patient's decision regarding this.  # Symptom management:     -Pain, acute on chronic in setting of metastatic non-small cell lung cancer with disease to brain and bone                               -Continue OxyContin 10 mg every 12 hours scheduled                               -Continue oxycodone 5 mg every  3 hours as needed                               -Continue dexamethasone 4 mg twice daily as per Dr. Liana Gerold recommendations   -Nausea/Vomiting   -Continue dexamethasone as detailed in above section.   -Increase olanzapine to 5mg  at bedtime and add dose of 2.5mg  in the AM  # Psychosocial Support:  -Husband   # Discharge Planning: To Be Determined  Discussed with: patient, patient's husband, RN, hospitalist, PT  Thank you for allowing the palliative care team to participate in the care [name].  Alvester Morin, DO Palliative Care Provider PMT # [phone]  If patient remains symptomatic despite maximum doses, please call PMT at [phone] between 0700 and 1900. Outside of these hours, please call attending, as PMT does not have night  coverage.  This provider spent a total of 65 minutes providing patient's care.  Includes review of EMR, discussing care with other staff members involved in patient's medical care, obtaining relevant history and information from patient and/or patient's family, and personal review of imaging and lab work. Greater than 50% of the time was spent counseling and coordinating care related to the above assessment and plan.    *Please note that this is a verbal dictation therefore any spelling or grammatical errors are due to the "[employer]" system interpretation.

## 2023-05-09 NOTE — Progress Notes (Signed)
Progress Note   Patient: Virginia [name] ZOX:[id number] DOB: [birth year] DOA: 05/03/2023     6 DOS: the patient was seen and examined on 05/09/2023   Brief hospital course: 67yo with NCSLC with mets to brain and bone, HTN, HLD, and COPD who presented on 8/24 with weakness and confusion. 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 She was found to have AKI associated with dehydration. Brain MRI with no acute changes superimposed on known metastatic disease, mild to moderate increase in R parietal region vasogenic edema. Dr. Barbaraann Cao consulted and recommended resuming Decadron with plan to review case at CNS Tumor Board and f/o MRI in 2 weeks.   Assessment and Plan:  Acute kidney injury On presentation, likely prerenal in the setting of dehydration. Baseline serum creatinine 0.86 presented with creatinine 1.6, stabilized but not back to baseline IV fluid hydration   Follow BMP   Avoid nephrotoxic medications   Generalized weakness: > multifactorial Thought to be due to symptomatic anemia on presentation and transfused 1 unit PRBC She remains significantly dizzy continuously, including at rest; slightly better after initiation of Decadron She is having difficulty participating in therapy due to dizziness PT and OT consulting   Symptomatic anemia Likely secondary to hematuria Transfused 1 unit PRBC Hgb stable Continue to follow CBC   Intermittent confusion/decreased ability to control right extremity CT head without new or progressive abnormality MRI brain: No acute findings superimposed on patient's cerebral metastatic disease. No overt evidence of infection.  Blood cultures x 1 negative x 4 days   Hematuria Without evidence of infection Urology consult needed if hematuria recurs Consider CT A/P with contrast when creatinine improved after hydration. SCDs DVT prophylaxis   Hypoalbuminemia Patient's albumin level steadily decreasing, 2.2 01/10/2023 albumin  3.6.  Steadily decreased since Protein powder ordered with meals   Cancer associated pain Continue home pain medications   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 [facility]) Malignant neoplasm metastatic to brain [facility]) Metastasis to brain [facility]) Management as per oncology Dr. Barbaraann Cao consulted on 8/26, recommended resuming Decadron BID Will review case at CNS tumor board Will f/u in 2 weeks with contrast,enhanced MRI study for review Palliative care consulted   Hypertension Continue Norvasc    Anxiety Continue Xanax, Zyprexa  Goals of Care Discussed at length the last few days Strongly encouraged CIR but patient has decided she prefers to go home instead Working on home health and DME equipment with plan for dc to home tomorrow  Palliative care met with them today, she is now DNR     Consultants:   Neuro Oncology Palliative care PT/OT CIR   Procedures: None   Antibiotics: None    30 Day Unplanned Readmission Risk Score    Flowsheet Row ED to Hosp-Admission (Current) from 05/03/2023 in [place] [facility]  30 Day Unplanned Readmission Risk Score (%) 24.15 Filed at 05/09/2023 0801       This score is the patient's risk of an unplanned readmission within 30 days of being discharged (0 -100%). The score is based on dignosis, age, lab data, medications, orders, and past utilization.   Low:  0-14.9   Medium: 15-21.9   High: 22-29.9   Extreme: 30 and above           Subjective: Some improvement in dizziness, has not ambulated yet today with therapy.  Still thinking about    Objective: Vitals:   05/08/23 2106 05/09/23 0523  BP: 109/79 129/78  Pulse: 92 65  Resp: 14 14  Temp: 98.6 F (37 C) 97.9 F (36.6 C)  SpO2: 98% 96%    Intake/Output Summary (Last 24 hours) at 05/09/2023 7846 Last data filed at 05/09/2023 0900 Gross per 24 hour  Intake 236 ml  Output 250 ml  Net -14 ml   There were no vitals filed for this visit.  Exam:  General:   Appears somnolent, frail Eyes:  EOMI, normal lids, iris ENT:  grossly normal hearing, lips & tongue, mmm Neck:  no LAD, masses or thyromegaly Cardiovascular:  RRR, no m/r/g. No LE edema.  Respiratory:   CTA bilaterally with no wheezes/rales/rhonchi.  Normal respiratory effort. Abdomen:  soft, NT, ND Skin:  no rash or induration seen on limited exam Musculoskeletal:  grossly normal tone BUE/BLE, good ROM, no bony abnormality Psychiatric:  blunted mood and affect, speech fluent and appropriate Neurologic:  CN 2-12 grossly intact, moves all extremities in coordinated fashion, reported dizziness/nausea with walking  Data Reviewed: I have reviewed the patient's lab results since admission.  Pertinent labs for today include:  Glucose 150 BN 22/Creatinine 1.32/GFR 45; 15/1.41/41 on 8/28 WBC 6.8  Hgb 9 - stable   Family Communication: Husband was present throughout evaluation  Disposition: Status is: Inpatient Remains inpatient appropriate because: unsafe disposition     Time spent: 50 minutes      Author: Jonah Blue, MD 05/09/2023 9:18 AM  For on call review [email or web address].

## 2023-05-09 NOTE — Plan of Care (Signed)

## 2023-05-10 ENCOUNTER — Encounter: Payer: Self-pay | Admitting: Hematology and Oncology

## 2023-05-10 ENCOUNTER — Telehealth: Payer: Self-pay | Admitting: *Deleted

## 2023-05-10 ENCOUNTER — Other Ambulatory Visit (HOSPITAL_COMMUNITY): Payer: Self-pay

## 2023-05-10 DIAGNOSIS — Z7189 Other specified counseling: Secondary | ICD-10-CM | POA: Diagnosis not present

## 2023-05-10 DIAGNOSIS — Z66 Do not resuscitate: Secondary | ICD-10-CM | POA: Diagnosis not present

## 2023-05-10 DIAGNOSIS — Z515 Encounter for palliative care: Secondary | ICD-10-CM | POA: Diagnosis not present

## 2023-05-10 DIAGNOSIS — G893 Neoplasm related pain (acute) (chronic): Secondary | ICD-10-CM | POA: Diagnosis not present

## 2023-05-10 DIAGNOSIS — N179 Acute kidney failure, unspecified: Secondary | ICD-10-CM | POA: Diagnosis not present

## 2023-05-10 MED ORDER — OLANZAPINE 5 MG PO TBDP
2.5000 mg | ORAL_TABLET | Freq: Every day | ORAL | 1 refills | Status: DC
  Filled 2023-05-10: qty 30, 60d supply, fill #0

## 2023-05-10 MED ORDER — RESOURCE INSTANT PROTEIN PO PWD PACKET
1.0000 | Freq: Three times a day (TID) | ORAL | 0 refills | Status: AC
  Filled 2023-05-10: qty 540, 30d supply, fill #0

## 2023-05-10 MED ORDER — OLANZAPINE 5 MG PO TABS
5.0000 mg | ORAL_TABLET | Freq: Every day | ORAL | 1 refills | Status: DC
  Filled 2023-05-10: qty 7, 7d supply, fill #0
  Filled 2023-05-11: qty 23, 23d supply, fill #0

## 2023-05-10 MED ORDER — DEXAMETHASONE 4 MG PO TABS
4.0000 mg | ORAL_TABLET | Freq: Two times a day (BID) | ORAL | 0 refills | Status: DC
  Filled 2023-05-10: qty 60, 30d supply, fill #0

## 2023-05-10 NOTE — Progress Notes (Signed)
Physical Therapy Treatment Patient Details Name: Virginia [name] MRN: [id number] DOB: 18-Sep-1955 Today's Date: 05/10/2023   History of Present Illness 67 yo female admitted with AKI, metabolic encephalopathy, weakness. Hx of NSCLC with mets, COPD, radiation necrosis, chronic dizziness. anemia, falls.    PT Comments  Pt in restroom with spouse upon arrival. Spouse demonstrates good safety awareness with correct gait belt positioning, good hand placement, safe speed, pt using RW to ambulate with good bil foot clearance, increased time to navigate around obstacles. Educated spouse on pt performing bed mobility independently as able despite increased time to maintain strength and independence. Attempted to encourage pt in stair training with spouse, but pt reports too fatigued and declines. Pt and spouse describe hand held technique they use to enter/exit 4 steps without a handrail at baseline and report good success. Will continue to progress as able.   If plan is discharge home, recommend the following: Assistance with cooking/housework;Assist for transportation;Help with stairs or ramp for entrance;A little help with walking and/or transfers;A little help with bathing/dressing/bathroom   Can travel by private vehicle     Yes  Equipment Recommendations  Hospital bed;BSC/3in1    Recommendations for Other Services       Precautions / Restrictions Precautions Precautions: Fall Restrictions Weight Bearing Restrictions: No     Mobility  Bed Mobility Overal bed mobility: Modified Independent             General bed mobility comments: increasead time to return to supine, educated spouse on being present to assist but allowing pt to complete transfer as able despite needing increased time    Transfers                        Ambulation/Gait Ambulation/Gait assistance: Contact guard assist Gait Distance (Feet): 12 Feet Assistive device: Rolling walker (2 wheels) Gait  Pattern/deviations: Step-through pattern, Decreased stride length Gait velocity: decreased     General Gait Details: spouse demo good safety awareness with in room/restroom ambulation, supporting pt with gait belt to ambulate back to bed, no overt LOB, spouse with safe handling and awareness   Stairs             Wheelchair Mobility     Tilt Bed    Modified Rankin (Stroke Patients Only)       Balance Overall balance assessment: Needs assistance Sitting-balance support: Feet supported, Single extremity supported Sitting balance-Leahy Scale: Good     Standing balance support: Bilateral upper extremity supported, During functional activity, Reliant on assistive device for balance Standing balance-Leahy Scale: Poor                              Cognition Arousal: Alert Behavior During Therapy: WFL for tasks assessed/performed Overall Cognitive Status: History of cognitive impairments - at baseline                                 General Comments: spouse present, pt responding to cues and interacting in conversation appropriately, conversational and joking with spouse        Exercises      General Comments General comments (skin integrity, edema, etc.): Educated spouse on allowing pt to complete bed mobility independently as able, being present to assist as needed.      Pertinent Vitals/Pain      Home Living  Prior Function            PT Goals (current goals can now be found in the care plan section) Acute Rehab PT Goals Patient Stated Goal: go home PT Goal Formulation: With patient/family Time For Goal Achievement: 05/18/23 Potential to Achieve Goals: Fair Progress towards PT goals: Progressing toward goals    Frequency    Min 1X/week      PT Plan      Co-evaluation              AM-PAC PT "6 Clicks" Mobility   Outcome Measure  Help needed turning from your back to your side  while in a flat bed without using bedrails?: A Little Help needed moving from lying on your back to sitting on the side of a flat bed without using bedrails?: A Little Help needed moving to and from a bed to a chair (including a wheelchair)?: A Little Help needed standing up from a chair using your arms (e.g., wheelchair or bedside chair)?: A Little Help needed to walk in hospital room?: A Little Help needed climbing 3-5 steps with a railing? : A Lot 6 Click Score: 17    End of Session Equipment Utilized During Treatment: Gait belt Activity Tolerance: Patient tolerated treatment well Patient left: in bed;with call bell/phone within reach;with family/visitor present Nurse Communication: Mobility status PT Visit Diagnosis: Muscle weakness (generalized) (M62.81);Difficulty in walking, not elsewhere classified (R26.2);Other abnormalities of gait and mobility (R26.89);Unsteadiness on feet (R26.81)     Time: 5573-2202 PT Time Calculation (min) (ACUTE ONLY): 12 min  Charges:    $Therapeutic Activity: 8-22 mins PT General Charges $$ ACUTE PT VISIT: 1 Visit                     Tori Kiandria Clum PT, DPT 05/10/23, 2:47 PM

## 2023-05-10 NOTE — Telephone Encounter (Signed)
Received call from pt's husband, Virginia [name]. He states that Virginia [name] will be going home today from being an in patient @ [facility]. He states that once the hospital bed has been delivered, she can be discharged. She will be getting home PT/OT.  Virginia [name] wanted to know if Virginia [name] should continue with her treatments here for her lung cancer. Advised that Dr. [name] feels that her current treatments are keeping her lung cancer stable but ultimately the decision is hers and Randy's. Asked Virginia [name] if he will be able to get her here on her next appt on 05/17/23 so that they can discuss steps moving forward. He feels that he can get her here. He and Virginia [name] will talk about this over the next few days. Advised to call next week with any questions or concerns

## 2023-05-10 NOTE — Progress Notes (Signed)
Daily Progress Note   Patient Name: Virginia [name]       Date: 05/10/2023 DOB: 1956-06-09  Age: 67 y.o. MRN#: [id number] Attending Physician: Jonah Blue, MD Primary Care Physician: [name], MD Admit Date: 05/03/2023 Length of Stay: 7 days  Reason for Consultation/Follow-up: Establishing goals of care  Subjective:   CC: Patient notes continuing to feel better overall.  Following up regarding complex medical decision making.  Subjective:  Reviewed EMR prior to presenting to bedside.  Yesterday patient and husband had reported wanting to go home with home health so TOC was assisting with this.  Presented to bedside to meet with patient.  Patient seen sitting up in bed enjoying her breakfast.  Patient's husband at bedside.  Inquired about symptom burden.  Patient does feel that her dizziness has slightly improved since yesterday.  Patient notes she continues to feel better overall after medication adjustments.  Discussed continuing on steroids as per Dr. Liana Gerold recommendations.  Will also continue on adjusted doses of olanzapine as patient denying any adverse effects to this medication.  Patient and husband then inquiring about going to CIR for rehab.  Patient and husband regretted saying they immediately wanted to go home with home health and would appreciate further information if patient would still be excepted to CIR.  Noted would reach out to [facility] about this to further answer their questions.  All questions answered at that time.  Thanked patient husband for allowing me to visit with them today.  Review of Systems Dizziness with nausea for years Objective:   Vital Signs:  BP 131/80 (BP Location: Right Arm)   Pulse 67   Temp 98.2 F (36.8 C) (Oral)   Resp 18   SpO2 96%   Physical Exam: General: NAD, awake, comfortably laying in bed Eyes: No drainage noted HENT: moist mucous membranes Cardiovascular: RRR Respiratory: no increased work of breathing noted, not in  respiratory distress Neuro: Awake, interactive, following commands easily Psych: Pleasant  Imaging:  I personally reviewed recent imaging.   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 During hospitalization patient has been managed for AKI associated with dehydration. Brain MRI has not shown acute changes superimposed on known metastatic disease, mild to moderate increase in right parietal region vasogenic edema though. Dr. Barbaraann Cao consulted and provided recommendations. Plan is for Dr. Barbaraann Cao to discuss patient's care with CNS tumor board and follow-up MRI in 2 weeks. Palliative medicine team consulted to assist with complex medical decision making.   Recommendations/Plan: # Complex medical decision making/goals of care:  - Patient and husband have determined to pursue patient participating in rehab further whether that be at home with home health versus CIR if appropriate.  Patient and husband planning to follow-up with Dr. Barbaraann Cao in the outpatient setting for further discussions regarding cancer directed therapies.  Already explained to patient and husband that hospice at home is always an option should that transition need to be made.  -  Code Status: Limited: Do not attempt resuscitation (DNR) -DNR-LIMITED -Do Not Intubate/DNI   # Symptom management:     -Pain, acute on chronic in setting of metastatic non-small cell lung cancer with disease to brain and bone                               -  Continue OxyContin 10 mg every 12 hours scheduled                               -Continue oxycodone 5 mg every 3 hours as needed                               -Continue dexamethasone 4 mg twice daily as  per Dr. Liana Gerold recommendations   -Nausea/Vomiting   -Continue dexamethasone as detailed in above section   -Continue olanzapine 2.5mg  in AM and 5mg  at bedtime  # Psychosocial Support:  -Husband   # Discharge Planning: Home with home health VS CIR  -Will need outpatient palliative medicine referral at time of discharge.   Discussed with: patient, patient's husband, RN, hospitalist, PT  Thank you for allowing the palliative care team to participate in the care [name].  Alvester Morin, DO Palliative Care Provider PMT # [phone]  If patient remains symptomatic despite maximum doses, please call PMT at [phone] between 0700 and 1900. Outside of these hours, please call attending, as PMT does not have night coverage.  *Please note that this is a verbal dictation therefore any spelling or grammatical errors are due to the "[employer]" system interpretation.

## 2023-05-10 NOTE — Progress Notes (Signed)
Mobility Specialist - Progress Note   05/10/23 1203  Mobility  Activity Ambulated with assistance in hallway  Level of Assistance Contact guard assist, steadying assist  Assistive Device Front wheel walker  Distance Ambulated (ft) 40 ft  Activity Response Tolerated fair  Mobility Referral Yes  $Mobility charge 1 Mobility  Mobility Specialist Start Time (ACUTE ONLY) 1149  Mobility Specialist Stop Time (ACUTE ONLY) 1201  Mobility Specialist Time Calculation (min) (ACUTE ONLY) 12 min   Pt received in recliner and agreeable to mobility. Pt required minA assist from STS. After ambulating ~43ft pt c/o feeling dizzy. Assisted pt w/ taking a seated rest break and encouraged pursed lip breaths. SpO2 at 99% & HR at 123bpm. Pt still eager to ambulate, despite still feeling dizzy. Pt opted out to be wheeled back to room. BP checked & recorded below. Pt to recliner after session with all needs met.    Post-mobility: 127/81 BP  Chief Technology Officer

## 2023-05-10 NOTE — Progress Notes (Signed)
Inpatient Rehab Admissions Coordinator:   I received a message from palliative MD that Pt. Wanted to discuss CIR again. I spoke with Pt. And husband over the phone and let them know that CIR would likely not have a bed for Pt until early-mid next week and that I cannot gaurantee substantial gains during a 2 week stay for a patient with progressive disease. They want to discuss d/c home vs CIR and get back to me.   Megan Salon, MS, CCC-SLP Rehab Admissions Coordinator  [phone] (celll) [phone] (office)

## 2023-05-10 NOTE — Progress Notes (Signed)
Inpatient Rehab Admissions Coordinator:   Per PT note, Pt. And husband feel that it would be best to d/c home with [facility]. CIR will sing off.   Megan Salon, MS, CCC-SLP Rehab Admissions Coordinator  [phone] (celll) [phone] (office)

## 2023-05-10 NOTE — TOC Progression Note (Signed)
Transition of Care [facility]) - Progression Note    Patient Details  Name: Virginia [name] MRN: [id number] Date of Birth: [birth year]  Transition of Care [facility]) CM/SW Contact  Beckie Busing, RN Phone [phone]  05/10/2023, 12:21 PM  Clinical Narrative:    CM followed up with [employer] about DME delivery. DME to be delivered today.    Expected Discharge Plan: Acute to Acute Transfer (Acute rehab versus hospice per MD) Barriers to Discharge: Continued Medical Work up  Expected Discharge Plan and Services In-house Referral: Hospice / Palliative Care Discharge Planning Services: CM Consult Post Acute Care Choice: NA Living arrangements for the past 2 months: Single Family Home                 DME Arranged: N/A DME Agency: NA       HH Arranged: NA HH Agency: NA         Social Determinants of Health (SDOH) Interventions SDOH Screenings   Food Insecurity: No Food Insecurity (05/04/2023)  Housing: Low Risk  (05/04/2023)  Transportation Needs: No Transportation Needs (05/04/2023)  Utilities: Not At Risk (05/04/2023)  Depression (PHQ2-9): Low Risk  (11/29/2021)  Tobacco Use: Medium Risk (05/03/2023)    Readmission Risk Interventions    05/07/2023    1:35 PM  Readmission Risk Prevention Plan  Transportation Screening Complete  PCP or Specialist Appt within 3-5 Days Complete  HRI or Home Care Consult Complete  Social Work Consult for Recovery Care Planning/Counseling Complete  Palliative Care Screening Not Applicable  Medication Review Oceanographer) Complete

## 2023-05-10 NOTE — Discharge Summary (Addendum)
Physician Discharge Summary   Patient: Virginia [name] MRN: [id number] DOB: 06/24/1956  Admit date:     05/03/2023  Discharge date: 05/10/23  Discharge Physician: Jonah Blue   PCP: [name], MD   Recommendations at discharge:   Continue Decadron to see if this helps dizziness Follow up with Drs. [name] and [employer] as directed Follow up with Dr. 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 She was found to have AKI associated with dehydration. Brain MRI with no acute changes superimposed on known metastatic disease, mild to moderate increase in R parietal region vasogenic edema. Dr. Barbaraann Cao consulted and recommended resuming Decadron with plan to review case at CNS Tumor Board and f/o MRI in 2 weeks.   Assessment and Plan:  Acute kidney injury On presentation, likely prerenal in the setting of dehydration. Baseline serum creatinine  0.86 presented with creatinine 1.6, stabilized but not back to baseline Follow up with PCP to recheck BMP in 1-2 weeks   Generalized weakness: > multifactorial Thought to be due to symptomatic anemia on presentation and transfused 1 unit PRBC She remains significantly dizzy continuously, including at rest; slightly better after initiation of Decadron She is having difficulty participating in therapy due to dizziness PT and OT consulting There was significant discussion about CIR vs. Home with home health and ultimately she is considering unlikely to improve significantly with CIR given the duration of her symptoms so will dc to home   Symptomatic anemia Likely secondary to hematuria Transfused 1 unit PRBC Hgb stable   Intermittent confusion/decreased ability to control right extremity CT head without new or progressive abnormality MRI brain: No acute findings superimposed on patient's cerebral metastatic disease. No overt evidence of infection.  Blood cultures x 1 negative x 4 days   Hematuria Without evidence of infection Urology consult needed if hematuria recurs   Hypoalbuminemia Patient's albumin level steadily decreasing, 2.2 01/10/2023 albumin 3.6.  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 Barbaraann Cao consulted on 8/26, recommended resuming Decadron BID Will review case at CNS tumor board Will f/u in 2 weeks with contrast,enhanced MRI study for review Palliative care consulted, recommend referral [facility] with Lowella Bandy [facility])   Hypertension  Continue Norvasc    Anxiety Continue Xanax, Zyprexa   Goals of Care Discussed at length the last few days Strongly encouraged CIR but patient has decided she prefers to go home instead Working on home health  and DME equipment with plan for dc to home tomorrow  Palliative care met with them, she is now DNR     Consultants:   Neuro Oncology Palliative care PT/OT CIR   Procedures: None   Antibiotics: None       Pain control - [place] Controlled Substance Reporting System database was reviewed. and patient was instructed, not to drive, operate heavy machinery, perform activities at heights, swimming or participation in water activities or provide baby-sitting services while on Pain, Sleep and Anxiety Medications; until their outpatient Physician has advised to do so again. Also recommended to not to take more than prescribed Pain, Sleep and Anxiety Medications.    Disposition: Home Diet recommendation:  Regular diet DISCHARGE MEDICATION: Allergies as of 05/10/2023   No Known Allergies      Medication List     STOP taking these medications    AIRBORNE PO       TAKE these medications    acetaminophen 500 MG tablet Commonly known as: TYLENOL Take 500 mg by mouth 2 (two) times daily as needed for moderate pain.   albuterol 108 (90 Base) MCG/ACT inhaler Commonly known as: VENTOLIN HFA INHALE 2 PUFFS INTO THE LUNGS EVERY 6 HOURS AS NEEDED FOR WHEEZING OR SHORTNESS OF BREATH   ALPRAZolam 0.25 MG tablet Commonly known as: XANAX TAKE 1 TABLET(0.25 MG) BY MOUTH TWICE DAILY AS NEEDED FOR ANXIETY   amLODipine 10 MG tablet Commonly known as: NORVASC TAKE 1 TABLET BY MOUTH EVERY DAY   dexamethasone 4 MG tablet Commonly known as: DECADRON Take 1 tablet (4 mg total) by mouth every 12 (twelve) hours.   EPINEPHrine 0.3 mg/0.3 mL Soaj injection Commonly known as: EPI-PEN Inject 0.3 mg into the muscle as needed for anaphylaxis.   folic acid 1 MG tablet Commonly known as: FOLVITE TAKE 1 TABLET BY MOUTH DAILY   furosemide 20 MG tablet Commonly known as: LASIX Take 2 tablets (40 mg total) by mouth 2 times daily. What changed:  when to take this additional  instructions   GLUCOSAMINE PO Take 2 tablets by mouth daily.   LYSINE PO Take 2 tablets by mouth daily.   meclizine 25 MG tablet Commonly known as: ANTIVERT Take 1 tablet (25 mg total) by mouth 3 (three) times daily as needed for dizziness.   multivitamin with minerals Tabs tablet Take 1 tablet by mouth daily.   OLANZapine 5 MG tablet Commonly known as: ZYPREXA Take 1 tablet (5 mg total) by mouth at bedtime. What changed:  medication strength how much to take   OLANZapine zydis 5 MG disintegrating tablet Commonly known as: ZYPREXA Take 0.5 tablets (2.5 mg total) by mouth daily. Start taking on: May 11, 2023 What changed: You were already taking a medication with the same name, and this prescription was added. Make sure you understand how and when to take each.   ondansetron 4 MG tablet Commonly known as: ZOFRAN TAKE 1 TABLET(4 MG) BY MOUTH EVERY 8 HOURS AS NEEDED FOR NAUSEA OR VOMITING   OVER THE COUNTER MEDICATION Take 2 tablets by mouth daily. Beet root   oxyCODONE 5 MG immediate release tablet Commonly known as: Oxy IR/ROXICODONE Take 1 tablet (5 mg total) by mouth every 4 (four) hours as needed for severe pain.  pantoprazole 40 MG tablet Commonly known as: PROTONIX Take 1 tablet (40 mg total) by mouth daily.   polyethylene glycol 17 g packet Commonly known as: MIRALAX / GLYCOLAX Take 17 g by mouth every evening.   potassium chloride SA 20 MEQ tablet Commonly known as: KLOR-CON M Take 2 tablets (40 mEq total) by mouth 2 (two) times daily.   protein supplement 6 g Powd Commonly known as: RESOURCE BENEPROTEIN Take 1 Scoop (6 g total) by mouth 3 (three) times daily with meals.   VISINE OP Place 1 drop into both eyes daily as needed (irritation).   VITAMIN B12 PO Take 1 tablet by mouth daily. Gummies   VITAMIN D PO Take 1 capsule by mouth every evening.   vitamin E 180 MG (400 UNITS) capsule Take 400 IU daily x 1 week, then 400 IU BID What changed:   how much to take how to take this when to take this additional instructions   Xtampza ER 13.5 MG C12a Generic drug: oxyCODONE ER Take 1 capsule by mouth in the morning and at bedtime. (every 12 hours)               Durable Medical Equipment  (From admission, onward)           Start     Ordered   05/09/23 1344  For home use only DME Bedside commode  Once       Question Answer Comment  Patient needs a bedside commode to treat with the following condition Weakness   Patient needs a bedside commode to treat with the following condition Lung cancer (HCC)      05/09/23 1344   05/09/23 1341  For home use only DME Hospital bed  Once       Comments: Therapeutic mattress  Question Answer Comment  Length of Need Lifetime   Patient has (list medical condition): weakness, non-small cell lung cancer with mets to brain, bone.   Bed type Semi-electric      05/09/23 1343            Discharge Exam:   Subjective: Still feeling dizzy and having difficulty with ambulation - which has been the case for several years.   Objective: Vitals:   05/09/23 2015 05/10/23 0450  BP: 135/89 131/80  Pulse: 82 67  Resp: 18 18  Temp: (!) 97.5 F (36.4 C) 98.2 F (36.8 C)  SpO2: 98% 96%   No intake or output data in the 24 hours ending 05/10/23 1422  There were no vitals filed for this visit.  Exam:  General:  Appears somnolent, frail Eyes:  EOMI, normal lids, iris ENT:  grossly normal hearing, lips & tongue, mmm Neck:  no LAD, masses or thyromegaly Cardiovascular:  RRR, 2-3/6 systolic murmur. No LE edema.  Respiratory:   CTA bilaterally with no wheezes/rales/rhonchi.  Normal respiratory effort. Abdomen:  soft, NT, ND Skin:  no rash or induration seen on limited exam; mild BLE erythema that appears to be c/w stasis dermatitis Musculoskeletal:  grossly normal tone BUE/BLE, good ROM, no bony abnormality Psychiatric:  blunted mood and affect, speech fluent and  appropriate Neurologic:  CN 2-12 grossly intact, moves all extremities in coordinated fashion, reported dizziness/nausea with walking  Data Reviewed: I have reviewed the patient's lab results since admission.  Pertinent labs for today include:  None today     Condition at discharge: fair  The results of significant diagnostics from this hospitalization (including imaging, microbiology, ancillary and laboratory) are listed  below for reference.   Imaging Studies: MR BRAIN WO CONTRAST  Result Date: 05/04/2023 CLINICAL DATA:  Brain metastases with neurologic changes. EXAM: MRI HEAD WITHOUT CONTRAST TECHNIQUE: Multiplanar, multiecho pulse sequences of the brain and surrounding structures were obtained without intravenous contrast. COMPARISON:  04/01/2023 FINDINGS: Brain: Known metastatic disease, underestimated relative to prior brain MRI in the absence of IV contrast. Fairly extensive vasogenic edema in the bilateral cerebellum, especially in the mid and superior portions. There has been a mild increase in edema at the right parietal lesion, although no increase in mass effect. Borderline increased vasogenic edema in the anterior left frontal lobe. No indication of acute hemorrhage, hydrocephalus, or collection. Vascular: Major flow voids are preserved Skull and upper cervical spine: Curved left occipital craniotomy site is unremarkable. Sinuses/Orbits: Negative. IMPRESSION: 1. No acute finding superimposed on the patient's cerebral metastatic disease. 2. Mild-to-moderate increase in vasogenic edema centered on a right parietal lesion. Fairly extensive cerebellar edema is unchanged. No hydrocephalus or shift. Electronically Signed   By: Tiburcio Pea M.D.   On: 05/04/2023 12:03   CT Head Wo Contrast  Result Date: 05/03/2023 CLINICAL DATA:  Altered mental status EXAM: CT HEAD WITHOUT CONTRAST TECHNIQUE: Contiguous axial images were obtained from the base of the skull through the vertex without  intravenous contrast. RADIATION DOSE REDUCTION: This exam was performed according to the departmental dose-optimization program which includes automated exposure control, adjustment of the mA and/or kV according to patient size and/or use of iterative reconstruction technique. COMPARISON:  12/01/2020 head CT Brain MRI 04/01/2023 FINDINGS: Brain: Large area of vasogenic edema within the right parietal lobe with a small area of calcification peripherally, unchanged from 04/01/2023. There are multiple other partially calcified lesions, most notably within the cerebellum that correspond to the previously demonstrated metastatic lesions. No acute hemorrhage or extra-axial collection. No midline shift or hydrocephalus. Vascular: Cavernous carotid calcification. Skull: Remote left occipital craniotomy Sinuses/Orbits: Negative Other: None IMPRESSION: Allowing for differences in modality, unchanged appearance of multiple cerebral and cerebellar metastases. Electronically Signed   By: Deatra Robinson M.D.   On: 05/03/2023 19:07   DG Chest Port 1 View  Result Date: 05/03/2023 CLINICAL DATA:  Shortness of breath, fever EXAM: PORTABLE CHEST 1 VIEW COMPARISON:  11/29/2021 FINDINGS: Cardiomegaly. Aortic atherosclerosis. No confluent airspace opacities or effusions. No edema. No acute bony abnormality. IMPRESSION: Cardiomegaly, no active disease. Electronically Signed   By: Charlett Nose M.D.   On: 05/03/2023 18:50    Microbiology: Results for orders placed or performed during the hospital encounter of 05/03/23  Resp panel by RT-PCR (RSV, Flu A&B, Covid) Anterior Nasal Swab     Status: None   Collection Time: 05/03/23  6:19 PM   Specimen: Anterior Nasal Swab  Result Value Ref Range Status   SARS Coronavirus 2 by RT PCR NEGATIVE NEGATIVE Final    Comment: (NOTE) SARS-CoV-2 target nucleic acids are NOT DETECTED.  The SARS-CoV-2 RNA is generally detectable in upper respiratory specimens during the acute phase of  infection. The lowest concentration of SARS-CoV-2 viral copies this assay can detect is 138 copies/mL. A negative result does not preclude SARS-Cov-2 infection and should not be used as the sole basis for treatment or other patient management decisions. A negative result may occur with  improper specimen collection/handling, submission of specimen other than nasopharyngeal swab, presence of viral mutation(s) within the areas targeted by this assay, and inadequate number of viral copies(<138 copies/mL). A negative result must be combined with clinical observations, patient history,  and epidemiological information. The expected result is Negative.  Fact Sheet for Patients:  BloggerCourse.com  Fact Sheet for Healthcare Providers:  SeriousBroker.it  This test is no t yet approved or cleared by the [place] FDA and  has been authorized for detection and/or diagnosis of SARS-CoV-2 by FDA under an Emergency Use Authorization (EUA). This EUA will remain  in effect (meaning this test can be used) for the duration of the COVID-19 declaration under Section 564(b)(1) of the Act, 21 U.S.C.section 360bbb-3(b)(1), unless the authorization is terminated  or revoked sooner.       Influenza A by PCR NEGATIVE NEGATIVE Final   Influenza B by PCR NEGATIVE NEGATIVE Final    Comment: (NOTE) The Xpert Xpress SARS-CoV-2/FLU/RSV plus assay is intended as an aid in the diagnosis of influenza from Nasopharyngeal swab specimens and should not be used as a sole basis for treatment. Nasal washings and aspirates are unacceptable for Xpert Xpress SARS-CoV-2/FLU/RSV testing.  Fact Sheet for Patients: BloggerCourse.com  Fact Sheet for Healthcare Providers: SeriousBroker.it  This test is not yet approved or cleared by the [place] FDA and has been authorized for detection and/or diagnosis of SARS-CoV-2  by FDA under an Emergency Use Authorization (EUA). This EUA will remain in effect (meaning this test can be used) for the duration of the COVID-19 declaration under Section 564(b)(1) of the Act, 21 U.S.C. section 360bbb-3(b)(1), unless the authorization is terminated or revoked.     Resp Syncytial Virus by PCR NEGATIVE NEGATIVE Final    Comment: (NOTE) Fact Sheet for Patients: BloggerCourse.com  Fact Sheet for Healthcare Providers: SeriousBroker.it  This test is not yet approved or cleared by the [place] FDA and has been authorized for detection and/or diagnosis of SARS-CoV-2 by FDA under an Emergency Use Authorization (EUA). This EUA will remain in effect (meaning this test can be used) for the duration of the COVID-19 declaration under Section 564(b)(1) of the Act, 21 U.S.C. section 360bbb-3(b)(1), unless the authorization is terminated or revoked.  Performed at [facility], [address], [place], [place] [address]   Culture, blood (routine x 2)     Status: None   Collection Time: 05/03/23  9:06 PM   Specimen: BLOOD LEFT FOREARM  Result Value Ref Range Status   Specimen Description   Final    BLOOD LEFT FOREARM Performed at [facility], [address], [place], [place] [address]    Special Requests   Final    BOTTLES DRAWN AEROBIC AND ANAEROBIC Blood Culture adequate volume Performed at [facility], [address] [place], [place] [address]    Culture   Final    NO GROWTH 5 DAYS Performed at [facility], [address] [place], [place] [address]    Report Status 05/08/2023 FINAL  Final  Urine Culture     Status: Abnormal   Collection Time: 05/04/23 12:35 AM   Specimen: Urine, Random  Result Value Ref Range Status   Specimen Description   Final    URINE, RANDOM Performed at [facility], [address] [place], [place]  [address]    Special Requests   Final    NONE Reflexed from [id number] Performed at [facility], [address] [place], [place] [address]    Culture (A)  Final    <10,000 COLONIES/mL INSIGNIFICANT GROWTH Performed at [facility], [address] [place], [place] [address]    Report Status 05/05/2023 FINAL  Final   *Note: Due to a large number of results and/or encounters for the requested time period, some results have not been displayed. A complete set of results can be found in Results Review.    Labs: CBC: Recent Labs  Lab 05/03/23 1922 05/04/23 0248 05/04/23 0917 05/04/23 1939 05/06/23 0846 05/07/23 0606 05/08/23 0605 05/09/23 0551  WBC 4.3   < > 3.7* 4.3 4.9 2.8* 5.9 6.8  NEUTROABS 2.7  --  2.1  --   --   --  4.0 4.6  HGB 8.2*   < > 8.5* 9.8* 9.9* 8.8* 10.0* 9.0*  HCT 24.6*   < > 26.6* 29.0* 29.8* 25.7* 29.3* 26.0*  MCV 102.9*   < > 105.6* 97.6 99.7 96.6 96.1 95.6  PLT 201   < > 147* 157 168 160 225 241   < > = values in this interval not displayed.   Basic Metabolic Panel: Recent Labs  Lab 05/05/23 2007 05/06/23 0846 05/07/23 0606 05/08/23 0605 05/09/23 0551  NA 136 140 137 141 137  K 3.2* 3.1* 3.2* 3.7 3.5  CL 107 106 105 109 106  CO2 24 22 22 24 24   GLUCOSE 129* 108* 145* 141* 150*  BUN 13 11 12 15 22   CREATININE 1.44* 1.40* 1.41* 1.41* 1.32*  CALCIUM 7.3* 8.0* 7.8* 8.3* 8.1*   Liver Function Tests: Recent Labs  Lab 05/03/23 1922 05/07/23 0606  AST 20 16  ALT 13 11  ALKPHOS 54 47  BILITOT 0.6 0.6  PROT 6.1* 5.7*  ALBUMIN 2.2* 1.9*   CBG: Recent Labs  Lab 05/03/23 2115  GLUCAP 94    Discharge time spent: greater than 30 minutes.  Signed: Jonah Blue, MD [facility] 05/10/2023

## 2023-05-10 NOTE — Plan of Care (Signed)
  Problem: Education: Goal: Knowledge of General Education information will improve Description: Including pain rating scale, medication(s)/side effects and non-pharmacologic comfort measures Outcome: Adequate for Discharge   Problem: Health Behavior/Discharge Planning: Goal: Ability to manage health-related needs will improve Outcome: Adequate for Discharge   Problem: Clinical Measurements: Goal: Ability to maintain clinical measurements within normal limits will improve Outcome: Adequate for Discharge Goal: Will remain free from infection Outcome: Adequate for Discharge Goal: Diagnostic test results will improve Outcome: Adequate for Discharge Goal: Cardiovascular complication will be avoided Outcome: Adequate for Discharge   Problem: Activity: Goal: Risk for activity intolerance will decrease Outcome: Adequate for Discharge   Problem: Nutrition: Goal: Adequate nutrition will be maintained Outcome: Adequate for Discharge   Problem: Coping: Goal: Level of anxiety will decrease Outcome: Adequate for Discharge   

## 2023-05-10 NOTE — Plan of Care (Signed)
  Problem: Education: Goal: Knowledge of General Education information will improve Description: Including pain rating scale, medication(s)/side effects and non-pharmacologic comfort measures Outcome: Progressing   Problem: Health Behavior/Discharge Planning: Goal: Ability to manage health-related needs will improve Outcome: Progressing   Problem: Clinical Measurements: Goal: Ability to maintain clinical measurements within normal limits will improve Outcome: Progressing Goal: Will remain free from infection Outcome: Progressing Goal: Diagnostic test results will improve Outcome: Progressing Goal: Respiratory complications will improve Outcome: Progressing   Problem: Clinical Measurements: Goal: Will remain free from infection Outcome: Progressing   Problem: Clinical Measurements: Goal: Diagnostic test results will improve Outcome: Progressing   Problem: Clinical Measurements: Goal: Respiratory complications will improve Outcome: Progressing   Problem: Activity: Goal: Risk for activity intolerance will decrease Outcome: Progressing   Problem: Coping: Goal: Level of anxiety will decrease Outcome: Progressing   Problem: Elimination: Goal: Will not experience complications related to bowel motility Outcome: Progressing Goal: Will not experience complications related to urinary retention Outcome: Progressing   Problem: Elimination: Goal: Will not experience complications related to urinary retention Outcome: Progressing   Problem: Pain Managment: Goal: General experience of comfort will improve Outcome: Progressing   Problem: Safety: Goal: Ability to remain free from injury will improve Outcome: Progressing   Problem: Skin Integrity: Goal: Risk for impaired skin integrity will decrease Outcome: Progressing

## 2023-05-10 NOTE — Progress Notes (Signed)
Occupational Therapy Treatment Patient Details Name: Virginia [name] MRN: [id number] DOB: [birth year] Today's Date: 05/10/2023   History of present illness 67 yo female admitted with AKI, metabolic encephalopathy, weakness. Hx of NSCLC with mets, COPD, radiation necrosis, chronic dizziness. anemia, falls.   OT comments  Pt has made significant progress. Mobilizing with CGA,and CGA to min A with ADL tasks @ RW level. Increased time spent educating husband on expectations regarding her ability to complete tasks and assistance needed to maximize independence while reducing her risk of falls. It is reasonable to live on first floor at this time with recommended DME HHOT/PT.  Reached out to PT to work on stair training if able. Will continue to follow.        If plan is discharge home, recommend the following:  A little help with walking and/or transfers;A little help with bathing/dressing/bathroom;Assistance with cooking/housework;Direct supervision/assist for medications management;Assist for transportation;Help with stairs or ramp for entrance   Equipment Recommendations  Other (comment) (all equipment has been ordered)    Recommendations for Other Services      Precautions / Restrictions Precautions Precautions: Fall       Mobility Bed Mobility Overal bed mobility: Modified Independent Bed Mobility: Supine to Sit     Supine to sit: Modified independent (Device/Increase time)     General bed mobility comments: elevated HOB    Transfers Overall transfer level: Needs assistance Equipment used: Rolling walker (2 wheels) Transfers: Sit to/from Stand Sit to Stand: Supervision                 Balance Overall balance assessment: Needs assistance   Sitting balance-Leahy Scale: Good       Standing balance-Leahy Scale: Poor                             ADL either performed or assessed with clinical judgement   ADL Overall ADL's : Needs  assistance/impaired Eating/Feeding: Set up   Grooming: Set up;Sitting   Upper Body Bathing: Minimal assistance;Sitting   Lower Body Bathing: Minimal assistance;Sit to/from stand   Upper Body Dressing : Minimal assistance   Lower Body Dressing: Minimal assistance;Sit to/from stand   Toilet Transfer: Contact guard assist;Ambulation   Toileting- Clothing Manipulation and Hygiene: Contact guard assist       Functional mobility during ADLs: Cueing for safety;Contact guard assist;Rolling walker (2 wheels)      Extremity/Trunk Assessment Upper Extremity Assessment Upper Extremity Assessment: LUE deficits/detail LUE Deficits / Details: 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 Husband asking her to stand at sink to copmlete oral care and she communicated that she would be safer at a chair level.Baseline L inattentions        Exercises      Shoulder Instructions       General Comments encouraged  husband to search elbow sleeve to protect L elbow from rubbing    Pertinent Vitals/ Pain       Pain Assessment Pain Assessment: Faces Faces Pain Scale: Hurts a little bit Pain Location: low back Pain Descriptors / Indicators: Discomfort Pain Intervention(s): Limited activity within patient's tolerance  Home Living                                          Prior Functioning/Environment              Frequency  Min 1X/week        Progress Toward  Goals  OT Goals(current goals can now be found in the care plan section)  Progress towards OT goals: Progressing toward goals  Acute Rehab OT Goals Patient Stated Goal: to get back to her prior level OT Goal Formulation: With patient/family Time For Goal Achievement: 05/19/23 Potential to Achieve Goals: Good ADL Goals Pt Will Perform Lower Body Bathing: with supervision;sit to/from stand;sitting/lateral leans Pt Will Perform Lower Body Dressing: with supervision;sit to/from stand;sitting/lateral leans Pt Will Transfer to Toilet: with supervision;ambulating Pt/caregiver will Perform Home Exercise Program: Increased strength;Both right and left upper extremity;With Supervision;With written HEP provided  Plan      Co-evaluation                 AM-PAC OT "6 Clicks" Daily Activity     Outcome Measure   Help from another person eating meals?: None Help from another person taking care of personal grooming?: A Little Help from another person toileting, which includes using toliet, bedpan, or urinal?: A Little Help from another person bathing (including washing, rinsing, drying)?: A Little Help from another person to put on and taking off regular upper body clothing?: A Little Help from another person to put on and taking off regular lower body clothing?: A Little 6 Click Score: 19    End of Session Equipment Utilized During Treatment: Gait belt;Rolling walker (2 wheels)  OT Visit Diagnosis: Unsteadiness on feet (R26.81);Other abnormalities of gait and mobility (R26.89);Muscle weakness (generalized) (M62.81);Other symptoms and signs involving cognitive function   Activity Tolerance Patient tolerated treatment well   Patient Left in chair;with call bell/phone within reach;with family/visitor present   Nurse Communication Other (comment) (husband walking with pt)        Time: 5638-7564 OT Time Calculation (min): 38 min  Charges: OT General Charges $OT Visit: 1 Visit OT  Treatments $Self Care/Home Management : 38-52 mins  Luisa Dago, OT/L   Acute OT Clinical Specialist Acute Rehabilitation Services Pager [phone] Office [phone]   [facility] 05/10/2023, 12:26 PM

## 2023-05-11 ENCOUNTER — Other Ambulatory Visit (HOSPITAL_COMMUNITY): Payer: Self-pay

## 2023-05-11 DIAGNOSIS — N179 Acute kidney failure, unspecified: Secondary | ICD-10-CM | POA: Diagnosis not present

## 2023-05-11 NOTE — Progress Notes (Signed)
Progress Note   Patient: Virginia [name] ZOX:[id number] DOB: 04/12/1956 DOA: 05/03/2023     8 DOS: the patient was seen and examined on 05/11/2023   Brief hospital course: 66yo with NCSLC with mets to brain and bone, HTN, HLD, and COPD who presented on 8/24 with weakness and confusion. 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 She was found to have AKI associated with dehydration. Brain MRI with no acute changes superimposed on known metastatic disease, mild to moderate increase in R parietal region vasogenic edema. Dr. Barbaraann Cao consulted and recommended resuming Decadron with plan to review case at CNS Tumor Board and f/o MRI in 2 weeks.   Assessment and Plan:  Acute kidney injury On presentation, likely prerenal in the setting of dehydration. Baseline serum creatinine 0.86 presented with creatinine 1.6, stabilized but not back to baseline Follow up with PCP to recheck BMP in 1-2 weeks   Generalized weakness: > multifactorial Thought to be due to symptomatic anemia on presentation and transfused 1 unit PRBC She remains significantly dizzy continuously, including at rest; slightly better after initiation of Decadron She is having difficulty participating in therapy due to dizziness PT and OT consulting There was significant discussion about CIR vs. Home with home health and ultimately she is considering unlikely to improve significantly with CIR given the duration of her symptoms so will dc to home   Symptomatic anemia Likely secondary to hematuria Transfused 1 unit PRBC Hgb stable   Intermittent confusion/decreased ability to control right extremity CT head without new or progressive abnormality MRI brain: No acute findings superimposed on patient's cerebral metastatic disease. No overt evidence of infection.  Blood cultures x 1 negative x 4 days   Hematuria Without evidence of infection Urology consult needed if hematuria recurs    Hypoalbuminemia Patient's albumin level steadily decreasing, 2.2 01/10/2023 albumin 3.6.  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 Barbaraann Cao consulted on 8/26, recommended resuming Decadron BID Will review case at CNS tumor board Will f/u in 2 weeks with contrast,enhanced MRI study for review Palliative care consulted, recommend referral to [facility] with Lowella Bandy ([place])   Hypertension Continue Norvasc    Anxiety Continue Xanax, Zyprexa   Goals of Care Discussed at length the last few days Strongly encouraged CIR but patient has decided she prefers to go home instead Working on home health and DME equipment with plan for dc to home tomorrow  Palliative care met with them, she is now DNR   Consultants:   Neuro Oncology Palliative care PT/OT CIR   Procedures: None   Antibiotics: None      30 Day Unplanned Readmission Risk Score    Flowsheet Row ED to Hosp-Admission (Discharged) from 05/03/2023 in [facility]  30 Day Unplanned Readmission Risk Score (%) 25.8 Filed at 05/11/2023 1200       This score is the patient's risk of an unplanned readmission within 30 days of being discharged (0 -100%). The score is based on dignosis, age, lab data, medications, orders, and past utilization.   Low:  0-14.9   Medium: 15-21.9   High: 22-29.9   Extreme: 30 and above           Subjective: Patient did not have bed delivered yesterday so was unable to leave.  Feeling ok today.  Wants to go home.  Legs are red when standing/walking but otherwise no new concerns.  Bed is coming today and they want to go home. No changes from dc summary yesterday otherwise.   Objective: Vitals:   05/10/23 2054 05/11/23 0518  BP: 124/84 124/88  Pulse: 78 71   Resp: 16 16  Temp: 97.7 F (36.5 C) 97.7 F (36.5 C)  SpO2: 96% 97%   No intake or output data in the 24 hours ending 05/11/23 1328 There were no vitals filed for this visit.  Exam:  General:  Appears somnolent, frail Eyes:  EOMI, normal lids, iris ENT:  grossly normal hearing, lips & tongue, mmm Neck:  no LAD, masses or thyromegaly Cardiovascular:  RRR, 2-3/6 systolic murmur. No LE edema.  Respiratory:   CTA bilaterally with no wheezes/rales/rhonchi.  Normal respiratory effort. Abdomen:  soft, NT, ND Skin:  no rash or induration seen on limited exam; mild BLE erythema that appears to be c/w stasis dermatitis Musculoskeletal:  grossly normal tone BUE/BLE, good ROM, no bony abnormality Psychiatric:  blunted mood and affect, speech fluent and appropriate Neurologic:  CN 2-12 grossly intact, moves all extremities in coordinated fashion, reported dizziness/nausea with walking  Data Reviewed: I have reviewed the patient's lab results since admission.  Pertinent labs for today include:     None   Family Communication: Husband present throughout evaluation    Time spent: 25 minutes    Author: Jonah Blue, MD 05/11/2023 1:28 PM  For on call review [email or web address].

## 2023-05-11 NOTE — Progress Notes (Signed)
Physical Therapy Treatment Patient Details Name: Virginia [name] MRN: [id number] DOB: 1956-03-22 Today's Date: 05/11/2023   History of Present Illness 67 yo female admitted with AKI, metabolic encephalopathy, weakness. Hx of NSCLC with mets, COPD, radiation necrosis, chronic dizziness. anemia, falls.    PT Comments  Pt and spouse educated on safety, gait belt use, assistance with ambulation and stair training. Pt and spouse verbalize and demo familiar stair negotiation pattern, demo good safety awareness and technique. All questions answered and practiced home entrance stair negotiation with good success.   If plan is discharge home, recommend the following: Assistance with cooking/housework;Assist for transportation;Help with stairs or ramp for entrance;A little help with walking and/or transfers;A little help with bathing/dressing/bathroom   Can travel by private vehicle     Yes  Equipment Recommendations  Hospital bed;BSC/3in1    Recommendations for Other Services       Precautions / Restrictions Precautions Precautions: Fall Restrictions Weight Bearing Restrictions: No     Mobility  Bed Mobility Overal bed mobility: Modified Independent             General bed mobility comments: increased time    Transfers Overall transfer level: Needs assistance Equipment used: Rolling walker (2 wheels) Transfers: Sit to/from Stand Sit to Stand: Supervision           General transfer comment: supv, verbal cues for hand placement    Ambulation/Gait Ambulation/Gait assistance: Contact guard assist Gait Distance (Feet): 12 Feet (x2) Assistive device: Rolling walker (2 wheels) Gait Pattern/deviations: Step-through pattern, Decreased stride length Gait velocity: decreased     General Gait Details: short steps with spouse supporting pt with gait belt in room/restroom ambulation and back to recliner, increased time with turns using RW, no overt LOB   Stairs Stairs:  Yes Stairs assistance: Contact guard assist Stair Management: Forwards Number of Stairs: 4 General stair comments: pt ascends/descends 4 steps with spouse providing bil HHA to rise and lower, pt and spouse familiar with technique demo good steadiness and safety, educated on gait belt use and verbal cues   Wheelchair Mobility     Tilt Bed    Modified Rankin (Stroke Patients Only)       Balance Overall balance assessment: Needs assistance Sitting-balance support: Feet supported, Single extremity supported Sitting balance-Leahy Scale: Good     Standing balance support: Bilateral upper extremity supported, During functional activity, Reliant on assistive device for balance Standing balance-Leahy Scale: Poor                              Cognition Arousal: Alert Behavior During Therapy: WFL for tasks assessed/performed Overall Cognitive Status: History of cognitive impairments - at baseline                                 General Comments: spouse present, pt responding to cues and interacting in conversation appropriately, conversational and joking with spouse        Exercises      General Comments        Pertinent Vitals/Pain Pain Assessment Pain Assessment: Faces Faces Pain Scale: Hurts a little bit Pain Location: low back Pain Descriptors / Indicators: Discomfort Pain Intervention(s): Monitored during session, Limited activity within patient's tolerance    Home Living  Prior Function            PT Goals (current goals can now be found in the care plan section) Acute Rehab PT Goals Patient Stated Goal: go home PT Goal Formulation: With patient/family Time For Goal Achievement: 05/18/23 Potential to Achieve Goals: Fair Progress towards PT goals: Progressing toward goals    Frequency    Min 1X/week      PT Plan      Co-evaluation              AM-PAC PT "6 Clicks" Mobility    Outcome Measure  Help needed turning from your back to your side while in a flat bed without using bedrails?: None Help needed moving from lying on your back to sitting on the side of a flat bed without using bedrails?: None Help needed moving to and from a bed to a chair (including a wheelchair)?: A Little Help needed standing up from a chair using your arms (e.g., wheelchair or bedside chair)?: A Little Help needed to walk in hospital room?: A Little Help needed climbing 3-5 steps with a railing? : A Little 6 Click Score: 20    End of Session Equipment Utilized During Treatment: Gait belt Activity Tolerance: Patient tolerated treatment well Patient left: in bed;with call bell/phone within reach;with family/visitor present Nurse Communication: Mobility status PT Visit Diagnosis: Muscle weakness (generalized) (M62.81);Difficulty in walking, not elsewhere classified (R26.2);Other abnormalities of gait and mobility (R26.89);Unsteadiness on feet (R26.81)     Time: 7510-2585 PT Time Calculation (min) (ACUTE ONLY): 27 min  Charges:    $Gait Training: 23-37 mins PT General Charges $$ ACUTE PT VISIT: 1 Visit                     Tori Hayzlee Mcsorley PT, DPT 05/11/23, 12:38 PM

## 2023-05-11 NOTE — Progress Notes (Signed)
PT Cancellation Note  Patient Details Name: NYALAH HANN MRN: [id number] DOB: Mar 24, 1956   Cancelled Treatment:    Reason Eval/Treat Not Completed: Other (comment). Pt reports dizziness, requesting meclizine prior to therapy. Will return as schedule permits.    Domenick Bookbinder PT, DPT 05/11/23, 9:49 AM

## 2023-05-11 NOTE — TOC Transition Note (Addendum)
Transition of Care [facility]) - CM/SW Discharge Note   Patient Details  Name: Virginia [name] MRN: [id number] Date of Birth: Jan 23, 1956  Transition of Care [facility]) CM/SW Contact:  Darleene Cleaver, LCSW Phone Number: 05/11/2023, 12:31 PM   Clinical Narrative:    CSW spoke to patient's husband Harvie Heck, [phone] to confirm hospital bed and bedside commode have been ordered.  Per patient's husband Harvie Heck his son and daughter in law will be at the house around 12:30pm to arrange space for the hospital bed.  CSW spoke to [place] at [place], to discuss when equipment will be delivered, per [employer] it will be delivered around 4pm today.  Patient's husband was asked if they needed to wait for equipment to be delivered he said no.  Patient will be going home with home health through [facility].  TOC signing off please reconsult with any other TOC needs, home health agency has been notified of planned discharge.   Final next level of care: Home w Home Health Services Barriers to Discharge: Barriers Resolved   Patient Goals and CMS Choice CMS Medicare.gov Compare Post Acute Care list provided to:: Patient Represenative (must comment) Choice offered to / list presented to : Spouse  Discharge Placement    Home with home health.              Patient to be transferred to facility by: Patient's husband Name of family member notified: Husband Randy Patient and family notified of of transfer: 05/11/23  Discharge Plan and Services Additional resources added to the After Visit Summary for   In-house Referral: Hospice / Palliative Care Discharge Planning Services: CM Consult Post Acute Care Choice: NA          DME Arranged: Hospital bed, Bedside commode DME Agency: [employer] Date DME Agency Contacted: 05/11/23 Time DME Agency Contacted: 1100 Representative spoke with at DME Agency: Vaughan Basta HH Arranged: PT, OT, RN HH Agency: [facility] Date HH Agency Contacted:  05/11/23 Time HH Agency Contacted: 1231 Representative spoke with at [facility] Agency: Herbert Seta  Social Determinants of Health (SDOH) Interventions SDOH Screenings   Food Insecurity: No Food Insecurity (05/04/2023)  Housing: Low Risk  (05/04/2023)  Transportation Needs: No Transportation Needs (05/04/2023)  Utilities: Not At Risk (05/04/2023)  Depression (PHQ2-9): Low Risk  (11/29/2021)  Tobacco Use: Medium Risk (05/03/2023)     Readmission Risk Interventions    05/07/2023    1:35 PM  Readmission Risk Prevention Plan  Transportation Screening Complete  PCP or Specialist Appt within 3-5 Days Complete  HRI or Home Care Consult Complete  Social Work Consult for Recovery Care Planning/Counseling Complete  Palliative Care Screening Not Applicable  Medication Review [employer]) Complete

## 2023-05-11 NOTE — Progress Notes (Signed)
Patient discharged to home with family, discharge instructions reviewed with patient who verbalized understanding. 

## 2023-05-14 ENCOUNTER — Other Ambulatory Visit: Payer: Self-pay

## 2023-05-15 ENCOUNTER — Telehealth: Payer: Self-pay | Admitting: Internal Medicine

## 2023-05-15 ENCOUNTER — Other Ambulatory Visit (HOSPITAL_COMMUNITY): Payer: Self-pay

## 2023-05-15 NOTE — Telephone Encounter (Signed)
Received fax from [facility] for Summary of Assessment Findings  SN 1W9 PT 1W1 for eval OT 1W1 for eval

## 2023-05-15 NOTE — Progress Notes (Shared)
Patient Care Team: [name], MD as PCP - General (Internal Medicine) Jaci Standard, MD as PCP - Hematology/Oncology (Hematology and Oncology) Syliva Overman, RN as Oncology Nurse Navigator Edsel Petrin, DO as Consulting Physician [facility] and Palliative Medicine)  Visit Date: 05/15/23  Subjective:    Patient ID: Virginia [name] , Female   DOB: 21-Jul-1956, 67 y.o.    MRN: [id number]   67 y.o. Female presents today for a hospital/ED follow-up. She was admitted to the hospital from 05/03/23 - 05/10/23 for weakness and confusion. Was having hematuria at the time. 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 Brain MRI with no acute changes superimposed on known metastatic disease, mild to moderate increase in R parietal region vasogenic edema. CT head without new or progressive abnormality. Found to have acute kidney injury with creatinine 1.6. Given 1 unit PRBC. Instructed to resume Decadron. Olanzapine changed to 2.5 mg morning and 5 mg bedtime. Referred to [facility] palliative care with Lowella Bandy [facility]). 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    Past Medical History:  Diagnosis Date   Anemia    Anxiety    Concussion 09/28/2019   DVT (deep venous thrombosis) (HCC) 2021   L leg   Dyspnea    GERD (gastroesophageal reflux disease)    Hypercholesterolemia    per pt, she does not have elevated lipids   Hypertension    met lung ca dx'd 09/2019   mets to spine, hip and brain   PONV (postoperative nausea and vomiting)    Tobacco abuse      Family History  Problem Relation Age of Onset   Heart disease Father    Drug abuse Daughter    Drug abuse Son    Cancer Sister    Heart disease Brother    Heart attack Brother     Social History   Social History Narrative   Not on file      ROS      Objective:   Vitals: There were no vitals taken for this visit.   Physical Exam     Results:   Studies obtained and personally reviewed by me:  Imaging, colonoscopy, mammogram, bone density scan, echocardiogram, heart cath, stress test, CT calcium score, etc. ***   Labs:       Component Value Date/Time   NA 137 05/09/2023 0551   K 3.5 05/09/2023 0551   CL 106 05/09/2023 0551   CO2 24 05/09/2023 0551   GLUCOSE 150 (H) 05/09/2023 0551   BUN 22 05/09/2023 0551   CREATININE 1.32 (H) 05/09/2023 0551   CREATININE 1.09 (H) 04/26/2023 1228   CREATININE 0.58 11/15/2017 0948   CALCIUM 8.1 (L) 05/09/2023 0551   PROT 5.7 (L) 05/07/2023 0606   ALBUMIN 1.9 (L) 05/07/2023 0606   AST 16 05/07/2023 0606   AST 18 04/26/2023 1228   ALT 11 05/07/2023 0606   ALT 8 04/26/2023 1228   ALKPHOS 47 05/07/2023 0606   BILITOT 0.6 05/07/2023 0606   BILITOT 0.4 04/26/2023 1228   GFRNONAA 45 (L) 05/09/2023 0551   GFRNONAA 56 (L) 04/26/2023 1228   GFRNONAA 100 11/15/2017 0948   GFRAA >60 06/09/2020 1051   GFRAA 116 11/15/2017 0948     Lab Results  Component Value Date   WBC 6.8 05/09/2023   HGB 9.0 (L) 05/09/2023   HCT 26.0 (L) 05/09/2023   MCV 95.6 05/09/2023   PLT 241  05/09/2023    Lab Results  Component Value Date   CHOL 235 (H) 01/12/2019   HDL 92 01/12/2019   LDLCALC 117 (H) 01/12/2019   TRIG 145 01/12/2019   CHOLHDL 2.6 01/12/2019    Lab Results  Component Value Date   HGBA1C 6.0 (H) 01/12/2019     Lab Results  Component Value Date   TSH 0.138 (L) 05/04/2023     No results found for: "PSA1", "PSA" *** delete for female pts  ***    Assessment & Plan:   ***    I,Alexander Ruley,acting as a scribe for [name], MD.,have documented all relevant documentation on the behalf of [name], MD,as directed by  [name], MD while in the presence of [name], MD.   ***

## 2023-05-15 NOTE — Telephone Encounter (Signed)
This message was sent via FAXCOM, a product from [employer]. [email or web address].biscom.com/                    -------Fax Transmission Report-------  To:               Recipient at [phone] Subject:          Fw: Hp Scans Result:           The transmission was successful. Explanation:      All Pages Ok Pages Sent:       3 Connect Time:     1 minutes, 29 seconds Transmit Time:    05/15/2023 10:02 Transfer Rate:    14400 Status Code:      0000 Retry Count:      0 Job Id:           [id number] Unique Id:        [id number] Fax Line:         54 Fax Server:       [employer]

## 2023-05-16 ENCOUNTER — Telehealth (INDEPENDENT_AMBULATORY_CARE_PROVIDER_SITE_OTHER): Payer: Medicare Other | Admitting: Internal Medicine

## 2023-05-16 ENCOUNTER — Encounter: Payer: Self-pay | Admitting: Internal Medicine

## 2023-05-16 VITALS — BP 123/85 | Temp 98.1°F | Ht 62.0 in | Wt 143.0 lb

## 2023-05-16 DIAGNOSIS — C7801 Secondary malignant neoplasm of right lung: Secondary | ICD-10-CM

## 2023-05-16 DIAGNOSIS — Z09 Encounter for follow-up examination after completed treatment for conditions other than malignant neoplasm: Secondary | ICD-10-CM | POA: Diagnosis not present

## 2023-05-16 NOTE — Progress Notes (Signed)
Patient Care Team: [name], MD as PCP - General (Internal Medicine) Jaci Standard, MD as PCP - Hematology/Oncology (Hematology and Oncology) Syliva Overman, RN as Oncology Nurse Navigator Edsel Petrin, DO as Consulting Physician [facility])  I connected with Virginia [name] on 05/16/23 at 4:33 PM by video enabled telemedicine visit and verified that I am speaking with the correct person using two identifiers.   I discussed the limitations, risks, security and privacy concerns of performing an evaluation and management service by telemedicine and the availability of in-person appointments. I also discussed with the patient that there may be a patient responsible charge related to this service. The patient expressed understanding and agreed to proceed.   Other persons participating in the visit and their role in the encounter: Medical scribe, Doylene Bode  Patient's location: Home  Provider's location: Clinic   I provided 15 minutes of face-to-face video visit time during this encounter, and > 50% was spent counseling as documented under my assessment & plan. She is identified using two identifiers, Virginia [name], a patient of this practice. She is in her home and I am in my practice. She is agreeable to using this format today.  Chief Complaint: hospital/ED follow-up.   Subjective:    Patient ID: Virginia [name] , Female    DOB: [birth year], 67 y.o.    MRN: [id number]   67 y.o. Female presents today for a hospital/ED follow-up. She is accompanied by her husband. She was admitted to [facility] from 05/03/23 - 05/11/23 for weakness and confusion. Was having hematuria at the time. She believe she was drinking enough water beforehand. 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 Brain MRI with no acute changes superimposed on known metastatic disease, mild to moderate increase in R parietal region vasogenic edema.  CT head without new or progressive abnormality. Found to have acute kidney injury with creatinine 1.44, calcium 7.3 on 05/05/23, improving to 1.32 and 8.1 on 05/09/23. Albumin 2.2 on arrival. Given 1 unit PRBC. Instructed to resume Decadron. Olanzapine changed to 2.5 mg morning and 5 mg bedtime. Referred to [facility] palliative care with [facility]) and she has decided to stay at home. 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 Followed by oncologist, Dr. [name]. Her appetite has improved after starting Decadron. Denies GI, urinary symptoms. She still has balance issues but is able to move around with a walker. She is taking amlodipine for hypertension.  Past Medical History:  Diagnosis Date   Anemia    Anxiety    Concussion 09/28/2019   DVT (deep venous thrombosis) (HCC) 2021   L leg   Dyspnea    GERD (gastroesophageal reflux disease)    Hypercholesterolemia    per pt, she does not have elevated lipids   Hypertension    met lung ca dx'd 09/2019   mets to spine, hip and brain   PONV (postoperative nausea and vomiting)    Tobacco abuse      Family History  Problem Relation Age of Onset   Heart disease Father    Drug abuse Daughter    Drug abuse Son    Cancer Sister    Heart disease Brother    Heart attack Brother     She has a history of adenocarcinoma of the lung followed by Dr. [name] and Dr. Barbaraann Cao at the [facility].  Her history is complex but began  in December 2020 she had a respiratory infection with 2 weeks of bleeding without fever along with sore throat.  She was treated with Biaxin and Hycodan as well as a tapering course of prednisone.  She was seen again September 08, 2019 due to lack of improvement.  She was seen virtually at that time because of the pandemic and a chest x-ray was ordered.  She was placed on prednisone and Levaquin.  Chest x-ray showed perihilar opacity on the right with possible mass.  CT of the chest  was obtained January 2021 and she was found to have a 5.7 cm central right lower lobe lung mass associated with high-grade stenosis of the central right lower lobe airways suspicious for bronchogenic carcinoma as well as postobstructive pneumonia in the right lower lobe.  She also had right hilar, subcarinal, bilateral paratracheal and right prevascular mediastinal lymphadenopathy suspicious for metastatic disease.  She had several subcentimeter solid pulmonary nodules in both lungs and a small right pleural effusion.  Subsequently was found to have metastatic lesions in the thoracic spine and L2.  In March 2022, she had a craniotomy with resection of progressive cerebellar mass by Dr. Venetia Maxon and pathology revealed radiation necrosis.  She has been treated with chemotherapy.  Has been maintained on oral steroid medication.  She had a fall at her home going up stairs in January 2020.  She hit the back of her head and lost consciousness.  CT of the brain showed abnormality in the posterior fossa later confirmed by MRI as metastasis along with 7 additional smaller lesions.  It took her some time to regain balance and cognition.  History of smoking in the remote past.  History of hypertension and hyperlipidemia as well as GE reflux.  Had DVT of left lower extremity in March 2021.  History of mild glucose intolerance and vitamin D deficiency.  She has had an abdominal hysterectomy in the remote past as well as cholecystectomy and D&C.  Had kyphoplasty in 2021 by Dr. Venetia Maxon.  Lipoma excision under left breast and right thigh 2018.  Prior to hysterectomy she had a tubal ligation.  History of sleep apnea and has been evaluated by Dr. Maple Hudson.  Social history: Married.  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  Smoked for over 20 years.  At one point quit but then resumed smoking.  3 adult children, 2 daughters and a son.  History of microscopic hematuria evaluated by Dr. Vernie Ammons in 2009 and found to be benign.   History of hyperlipidemia and GE reflux.  History of fractured left wrist at age 25.  Tubal ligation 1995, D&C January 2000, vaginal hysterectomy July 2000, laparoscopic cholecystectomy January 2002.  Family history: Father died at age 65 of an MI.  1 sister died of complications of HIV.  Another sister died of cancer.  1 brother and 1 sister living.   Review of Systems  Constitutional:  Negative for fever and malaise/fatigue.  HENT:  Negative for congestion.   Eyes:  Negative for blurred vision.  Respiratory:  Negative for cough and shortness of breath.   Cardiovascular:  Negative for chest pain, palpitations and leg swelling.  Gastrointestinal:  Negative for abdominal pain, blood in stool, constipation, diarrhea, nausea and vomiting.  Genitourinary:  Negative for dysuria, flank pain, frequency, hematuria and urgency.  Musculoskeletal:  Negative for back pain.  Skin:  Negative for rash.  Neurological:  Negative for loss of consciousness and headaches.        Objective:  Vitals: BP 123/85   Temp 98.1 F (36.7 C)   Ht 5\' 2"  (1.575 m)   Wt 143 lb (64.9 kg)   BMI 26.16 kg/m    Physical Exam Vitals and nursing note reviewed.  Constitutional:      General: She is not in acute distress.    Appearance: Normal appearance. She is not toxic-appearing.  HENT:     Head: Normocephalic and atraumatic.  Pulmonary:     Effort: Pulmonary effort is normal.  Skin:    General: Skin is warm and dry.  Neurological:     Mental Status: She is alert and oriented to person, place, and time. Mental status is at baseline.  Psychiatric:        Mood and Affect: Mood normal.        Behavior: Behavior normal.        Thought Content: Thought content normal.        Judgment: Judgment normal.       Results:   Studies obtained and personally reviewed by me:   Labs:       Component Value Date/Time   NA 137 05/09/2023 0551   K 3.5 05/09/2023 0551   CL 106 05/09/2023 0551   CO2 24  05/09/2023 0551   GLUCOSE 150 (H) 05/09/2023 0551   BUN 22 05/09/2023 0551   CREATININE 1.32 (H) 05/09/2023 0551   CREATININE 1.09 (H) 04/26/2023 1228   CREATININE 0.58 11/15/2017 0948   CALCIUM 8.1 (L) 05/09/2023 0551   PROT 5.7 (L) 05/07/2023 0606   ALBUMIN 1.9 (L) 05/07/2023 0606   AST 16 05/07/2023 0606   AST 18 04/26/2023 1228   ALT 11 05/07/2023 0606   ALT 8 04/26/2023 1228   ALKPHOS 47 05/07/2023 0606   BILITOT 0.6 05/07/2023 0606   BILITOT 0.4 04/26/2023 1228   GFRNONAA 45 (L) 05/09/2023 0551   GFRNONAA 56 (L) 04/26/2023 1228   GFRNONAA 100 11/15/2017 0948   GFRAA >60 06/09/2020 1051   GFRAA 116 11/15/2017 0948     Lab Results  Component Value Date   WBC 6.8 05/09/2023   HGB 9.0 (L) 05/09/2023   HCT 26.0 (L) 05/09/2023   MCV 95.6 05/09/2023   PLT 241 05/09/2023    Lab Results  Component Value Date   CHOL 235 (H) 01/12/2019   HDL 92 01/12/2019   LDLCALC 117 (H) 01/12/2019   TRIG 145 01/12/2019   CHOLHDL 2.6 01/12/2019    Lab Results  Component Value Date   HGBA1C 6.0 (H) 01/12/2019     Lab Results  Component Value Date   TSH 0.138 (L) 05/04/2023      Assessment & Plan:   Metastatic lung cancer / hospital discharge follow-up: management of lung cancer per Dr. [name]. She will mention the need for a BMET to Dr. [name] during her appointment tomorrow.  This was recommended by hospitalist in her discharge summary.  She is ambulating with a walker and her appetite has improved. Advised on staying well-hydrated.  She and her husband know we are happy to help in any way possible in terms of primary care needs.  She is in excellent hands with Dr. [name] and Dr. Barbaraann Cao.    I,Alexander Ruley,acting as a Neurosurgeon for [name], MD.,have documented all relevant documentation on the behalf of [name], MD,as directed by  [name], MD while in the presence of [name], MD.   I, [name], MD, have reviewed all documentation for  this visit. The  documentation on 05/25/23 for the exam, diagnosis, procedures, and orders are all accurate and complete.

## 2023-05-17 ENCOUNTER — Inpatient Hospital Stay: Payer: Medicare Other

## 2023-05-17 ENCOUNTER — Inpatient Hospital Stay (HOSPITAL_BASED_OUTPATIENT_CLINIC_OR_DEPARTMENT_OTHER): Payer: Medicare Other | Admitting: Hematology and Oncology

## 2023-05-17 ENCOUNTER — Inpatient Hospital Stay: Payer: Medicare Other | Attending: Hematology and Oncology

## 2023-05-17 ENCOUNTER — Inpatient Hospital Stay: Payer: Medicare Other | Admitting: Internal Medicine

## 2023-05-17 VITALS — BP 135/92 | HR 107 | Temp 98.0°F | Resp 16 | Wt 142.7 lb

## 2023-05-17 VITALS — BP 116/82 | HR 92 | Resp 16

## 2023-05-17 DIAGNOSIS — C3431 Malignant neoplasm of lower lobe, right bronchus or lung: Secondary | ICD-10-CM | POA: Insufficient documentation

## 2023-05-17 DIAGNOSIS — Z5112 Encounter for antineoplastic immunotherapy: Secondary | ICD-10-CM | POA: Diagnosis present

## 2023-05-17 DIAGNOSIS — Z5111 Encounter for antineoplastic chemotherapy: Secondary | ICD-10-CM | POA: Diagnosis present

## 2023-05-17 DIAGNOSIS — Z7952 Long term (current) use of systemic steroids: Secondary | ICD-10-CM | POA: Insufficient documentation

## 2023-05-17 DIAGNOSIS — R918 Other nonspecific abnormal finding of lung field: Secondary | ICD-10-CM

## 2023-05-17 DIAGNOSIS — R42 Dizziness and giddiness: Secondary | ICD-10-CM | POA: Insufficient documentation

## 2023-05-17 DIAGNOSIS — Z87891 Personal history of nicotine dependence: Secondary | ICD-10-CM | POA: Insufficient documentation

## 2023-05-17 DIAGNOSIS — Z9049 Acquired absence of other specified parts of digestive tract: Secondary | ICD-10-CM | POA: Insufficient documentation

## 2023-05-17 DIAGNOSIS — Z809 Family history of malignant neoplasm, unspecified: Secondary | ICD-10-CM | POA: Insufficient documentation

## 2023-05-17 DIAGNOSIS — Z923 Personal history of irradiation: Secondary | ICD-10-CM | POA: Insufficient documentation

## 2023-05-17 DIAGNOSIS — I700 Atherosclerosis of aorta: Secondary | ICD-10-CM | POA: Diagnosis not present

## 2023-05-17 DIAGNOSIS — Z8249 Family history of ischemic heart disease and other diseases of the circulatory system: Secondary | ICD-10-CM | POA: Insufficient documentation

## 2023-05-17 DIAGNOSIS — C7931 Secondary malignant neoplasm of brain: Secondary | ICD-10-CM

## 2023-05-17 DIAGNOSIS — C3491 Malignant neoplasm of unspecified part of right bronchus or lung: Secondary | ICD-10-CM | POA: Diagnosis not present

## 2023-05-17 DIAGNOSIS — Z79899 Other long term (current) drug therapy: Secondary | ICD-10-CM | POA: Diagnosis not present

## 2023-05-17 DIAGNOSIS — Z86718 Personal history of other venous thrombosis and embolism: Secondary | ICD-10-CM | POA: Diagnosis not present

## 2023-05-17 DIAGNOSIS — Z7901 Long term (current) use of anticoagulants: Secondary | ICD-10-CM | POA: Insufficient documentation

## 2023-05-17 DIAGNOSIS — Z8673 Personal history of transient ischemic attack (TIA), and cerebral infarction without residual deficits: Secondary | ICD-10-CM | POA: Insufficient documentation

## 2023-05-17 DIAGNOSIS — Z814 Family history of other substance abuse and dependence: Secondary | ICD-10-CM | POA: Insufficient documentation

## 2023-05-17 DIAGNOSIS — Z90710 Acquired absence of both cervix and uterus: Secondary | ICD-10-CM | POA: Diagnosis not present

## 2023-05-17 DIAGNOSIS — E876 Hypokalemia: Secondary | ICD-10-CM | POA: Diagnosis not present

## 2023-05-17 LAB — CMP (CANCER CENTER ONLY)
ALT: 18 U/L (ref 0–44)
AST: 14 U/L — ABNORMAL LOW (ref 15–41)
Albumin: 3 g/dL — ABNORMAL LOW (ref 3.5–5.0)
Alkaline Phosphatase: 50 U/L (ref 38–126)
Anion gap: 11 (ref 5–15)
BUN: 46 mg/dL — ABNORMAL HIGH (ref 8–23)
CO2: 27 mmol/L (ref 22–32)
Calcium: 8.3 mg/dL — ABNORMAL LOW (ref 8.9–10.3)
Chloride: 98 mmol/L (ref 98–111)
Creatinine: 1.47 mg/dL — ABNORMAL HIGH (ref 0.44–1.00)
GFR, Estimated: 39 mL/min — ABNORMAL LOW (ref >60)
Glucose, Bld: 286 mg/dL — ABNORMAL HIGH (ref 70–99)
Potassium: 3.9 mmol/L (ref 3.5–5.1)
Sodium: 136 mmol/L (ref 135–145)
Total Bilirubin: 0.3 mg/dL (ref 0.3–1.2)
Total Protein: 6.5 g/dL (ref 6.5–8.1)

## 2023-05-17 LAB — CBC WITH DIFFERENTIAL (CANCER CENTER ONLY)
Abs Immature Granulocytes: 0.4 10*3/uL — ABNORMAL HIGH (ref 0.00–0.07)
Basophils Absolute: 0 10*3/uL (ref 0.0–0.1)
Basophils Relative: 0 %
Eosinophils Absolute: 0 10*3/uL (ref 0.0–0.5)
Eosinophils Relative: 0 %
HCT: 35.8 % — ABNORMAL LOW (ref 36.0–46.0)
Hemoglobin: 12.5 g/dL (ref 12.0–15.0)
Immature Granulocytes: 3 %
Lymphocytes Relative: 7 %
Lymphs Abs: 1 10*3/uL (ref 0.7–4.0)
MCH: 33.3 pg (ref 26.0–34.0)
MCHC: 34.9 g/dL (ref 30.0–36.0)
MCV: 95.5 fL (ref 80.0–100.0)
Monocytes Absolute: 1.2 10*3/uL — ABNORMAL HIGH (ref 0.1–1.0)
Monocytes Relative: 9 %
Neutro Abs: 10.8 10*3/uL — ABNORMAL HIGH (ref 1.7–7.7)
Neutrophils Relative %: 81 %
Platelet Count: 448 10*3/uL — ABNORMAL HIGH (ref 150–400)
RBC: 3.75 MIL/uL — ABNORMAL LOW (ref 3.87–5.11)
RDW: 14.3 % (ref 11.5–15.5)
WBC Count: 13.4 10*3/uL — ABNORMAL HIGH (ref 4.0–10.5)
nRBC: 0 % (ref 0.0–0.2)

## 2023-05-17 LAB — TSH: TSH: 0.885 u[IU]/mL (ref 0.350–4.500)

## 2023-05-17 MED ORDER — SODIUM CHLORIDE 0.9 % IV SOLN
200.0000 mg | Freq: Once | INTRAVENOUS | Status: AC
  Administered 2023-05-17: 200 mg via INTRAVENOUS
  Filled 2023-05-17: qty 200

## 2023-05-17 MED ORDER — SODIUM CHLORIDE 0.9% FLUSH: 10.0000 mL | INTRAVENOUS | Status: DC | PRN

## 2023-05-17 MED ORDER — HEPARIN SOD (PORK) LOCK FLUSH 100 UNIT/ML IV SOLN: 500.0000 [IU] | Freq: Once | INTRAVENOUS | Status: DC | PRN

## 2023-05-17 MED ORDER — SODIUM CHLORIDE 0.9 % IV SOLN: Freq: Once | INTRAVENOUS | Status: AC

## 2023-05-17 MED ORDER — SODIUM CHLORIDE 0.9 % IV SOLN
500.0000 mg/m2 | Freq: Once | INTRAVENOUS | Status: AC
  Administered 2023-05-17: 900 mg via INTRAVENOUS
  Filled 2023-05-17: qty 20

## 2023-05-17 MED ORDER — XTAMPZA ER 13.5 MG PO C12A: 1.0000 | EXTENDED_RELEASE_CAPSULE | Freq: Two times a day (BID) | ORAL | 0 refills | Status: DC

## 2023-05-17 MED ORDER — PROCHLORPERAZINE MALEATE 10 MG PO TABS
10.0000 mg | ORAL_TABLET | Freq: Once | ORAL | Status: AC
  Administered 2023-05-17: 10 mg via ORAL
  Filled 2023-05-17: qty 1

## 2023-05-17 MED ORDER — FUROSEMIDE 20 MG PO TABS: 40.0000 mg | ORAL_TABLET | Freq: Two times a day (BID) | ORAL | 0 refills | Status: DC

## 2023-05-17 MED ORDER — CYANOCOBALAMIN 1000 MCG/ML IJ SOLN: 1000.0000 ug | Freq: Once | INTRAMUSCULAR | Status: DC

## 2023-05-17 NOTE — Patient Instructions (Signed)
[facility]  Discharge Instructions: Thank you for choosing [facility] to provide your oncology and hematology care.   If you have a lab appointment with the Cancer Center, please go directly to the Cancer Center and check in at the registration area.   Wear comfortable clothing and clothing appropriate for easy access to any Portacath or PICC line.   We strive to give you quality time with your provider. You may need to reschedule your appointment if you arrive late (15 or more minutes).  Arriving late affects you and other patients whose appointments are after yours.  Also, if you miss three or more appointments without notifying the office, you may be dismissed from the clinic at the provider's discretion.      For prescription refill requests, have your pharmacy contact our office and allow 72 hours for refills to be completed.    Today you received the following chemotherapy and/or immunotherapy agents : Alimta, Keytruda      To help prevent nausea and vomiting after your treatment, we encourage you to take your nausea medication as directed.  BELOW ARE SYMPTOMS THAT SHOULD BE REPORTED IMMEDIATELY: *FEVER GREATER THAN 100.4 F (38 C) OR HIGHER *CHILLS OR SWEATING *NAUSEA AND VOMITING THAT IS NOT CONTROLLED WITH YOUR NAUSEA MEDICATION *UNUSUAL SHORTNESS OF BREATH *UNUSUAL BRUISING OR BLEEDING *URINARY PROBLEMS (pain or burning when urinating, or frequent urination) *BOWEL PROBLEMS (unusual diarrhea, constipation, pain near the anus) TENDERNESS IN MOUTH AND THROAT WITH OR WITHOUT PRESENCE OF ULCERS (sore throat, sores in mouth, or a toothache) UNUSUAL RASH, SWELLING OR PAIN  UNUSUAL VAGINAL DISCHARGE OR ITCHING   Items with * indicate a potential emergency and should be followed up as soon as possible or go to the Emergency Department if any problems should occur.  Please show the CHEMOTHERAPY ALERT CARD or IMMUNOTHERAPY ALERT CARD  at check-in to the Emergency Department and triage nurse.  Should you have questions after your visit or need to cancel or reschedule your appointment, please contact [facility]  Dept: [phone]  and follow the prompts.  Office hours are 8:00 a.m. to 4:30 p.m. Monday - Friday. Please note that voicemails left after 4:00 p.m. may not be returned until the following business day.  We are closed weekends and major holidays. You have access to a nurse at all times for urgent questions. Please call the main number to the clinic Dept: [phone] and follow the prompts.   For any non-urgent questions, you may also contact your provider using MyChart. We now offer e-Visits for anyone 25 and older to request care online for non-urgent symptoms. For details visit [email or web address].   Also download the MyChart app! Go to the app store, search "MyChart", open the app, select , and log in with your MyChart username and password.

## 2023-05-17 NOTE — Progress Notes (Signed)
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 CT of the head showed showed concerning for metastatic disease involving the left cerebellum and right occipital lobe.  2) 09/30/2019: MRI brain confirms mass within the left cerebellum measures 1.6 x 1.3 x 1.5 cm as well as at least 8 metastatic lesions within the supratentorial and infratentorial brain as outlined 3) 10/01/2019: PET CT scan revealed hypermetabolic infrahilar right lower lobe mass with hypermetabolic nodal metastases in the right hilum, mediastinum, right supraclavicular region, left axilla and lower left neck. 4) 10/08/2019: US guided biopsy of the lymph nodes confirms poorly differentiated non-small cell carcinoma. Immunohistochemistry confirms adenocarcinoma of lung primary. PD-L1 and NGS later revealed a MET Exon 14 mutation and TPS of 90%.  5) 10/12/2019: Establish care with Dr. [name]  6) 11/04/2019: Day 1 of capmatinib 400mg  BID 7) 11/09/2019: presented to Rad/Onc with a DVT in LLE. Seen in symptom management clinic and started on apixaban.  8) 12/29/2019: CT C/A/P showed interval decrease in size of mass involving the superior segment of right lower lobe and reduction in mediastinal and left supraclavicular adenopathy though some small spinal lesions were noted in the interim between the two sets of scans 9) 01/25/2020: temporarily stopped capmatinib due to symptoms of nausea/fatigue. Restarted therapy on 01/30/2020 after brief chemo holiday.  10)  03/22/2020: CT C/A/P reveals a mixed picture, with new lung nodule and increased lymphadenopathy, however response in bone lesions. D/c capmatinib therapy 11) 04/08/2020: start of Carbo/Pem/Pem for 2nd line treatment. Cycle 1 Day 1   12) 04/29/2020: Cycle 2 Day 1 Carbo/Pem/Pem  13) 05/20/2020: Cycle 3 Day 1 Carbo/Pem/Pem  14) 06/09/2020: Cycle 4 Day 1 Carbo/Pem/Pem  15) 06/29/2020: Cycle 5 Day 1 maintenance Pem/Pem  16) 07/20/2020: Cycle 6 Day 1 maintenance Pem/Pem  17) 08/12/2020: Cycle 7 Day 1 maintenance Pem/Pem  18) 09/01/2020: Cycle 8 Day 1 maintenance Pem/Pem  19) 09/21/2020: Cycle 9 Day 1 maintenance Pem/Pem  20) 10/13/2020: Cycle 10 Day 1 maintenance Pem/Pem  21) 11/03/2020: Cycle 11 Day 1 maintenance Pem/Pem  22) 11/25/2020:  Cycle 12 Day 1 maintenance Pem/Pem 23) 12/15/2020:  Cycle 13 Day 1 maintenance Pem/Pem 24) 01/05/2021:  Cycle 14 Day 1 maintenance Pem/Pem 25) 01/26/2021:  Cycle 15 Day 1 maintenance Pem/Pem plus Bevacizumab.  02/17/2021: Cycle 16 Day 1 maintenance Pem/Pem plus Bevacizumab.  03/10/2021: Cycle 17 Day 1 maintenance Pem/Pem plus Bevacizumab.  03/31/2021: Cycle 18 Day 1 maintenance Pem/Pem  04/20/2021:  Cycle 19 Day 1 maintenance Pem/Pem  05/12/2021: Cycle 20 Day 1 maintenance Pem/Pem 06/01/2021: Cycle 21 Day 1 maintenance Pem/Pem 06/22/2021: Cycle 22 Day 1 maintenance Pem/Pem 07/13/2021: Cycle 23 Day 1 maintenance Pem/Pem 08/16/2021: Cycle 24 Day 1 maintenance Pem/Pem 09/08/2021: Cycle 25 Day 1 maintenance Pem/Pem plus Bevacizumab 09/28/2021: Cycle 26 Day 1 maintenance Pem/Pem plus Bevacizumab 10/20/2021: Cycle 27 Day 1 maintenance Pem/Pem plus Bevacizumab 11/09/2021: Cycle 28 Day 1 maintenance Pem/Pem  12/06/2021: Cycle 29 Day 1 maintenance Pem/Pem  12/27/2021: Cycle 30 Day 1 maintenance Pem/Pem  01/18/2022: Cycle 31 Day 1 maintenance Pem/Pem  02/07/2022: Cycle 32 Day  1 maintenance Pem/Pem  02/28/2022: Cycle 33 Day 1 maintenance Pem/Pem  03/23/2022: Cycle 34 Day 1 maintenance Pem/Pem   04/13/2022: Cycle 35 Day 1 maintenance Pem/Pem  05/04/2022: Cycle 36 Day 1 maintenance Pem/Pem  05/25/2022: Cycle 37 Day 1 maintenance Pem/Pem  06/14/2022: Cycle 38 Day 1 maintenance Pem/Pem 07/05/2022: Cycle 39 Day 1 maintenance Pem/Pem 08/13/2022: Cycle 40 Day 1 maintenance Pem/Pem 09/06/2022:Cycle 41 Day 1 maintenance Pem/Pem 09/27/2022: Cycle 42 Day 1 maintenance Pem/Pem 10/19/2022: Cycle 43 Day 1 maintenance Pem/Pem 11/09/2022: Cycle 44 Day 1 maintenance Pem/Pem 11/30/2022: Cycle 45 Day 1 maintenance Pem/Pem 12/20/2022: Cycle 46 Day 1 maintenance Pem/Pem 01/10/2023: Cycle 47 Day 1 maintenance Pem/Pem 01/31/2023:Cycle 48 Day 1 maintenance Pem/Pem 02/22/2023: Cycle 49 Day 1 maintenance Pem/Pem 03/15/2023: Cycle 50 Day 1 maintenance Pem/Pem 04/04/2023: Cycle 51 Day 1 maintenance Pem/Pem 04/26/2023: Cycle 52 Day 1 maintenance Pem/Pem 05/17/2023: Cycle 53 Day 1 maintenance Pem/Pem  Interval History:  Virginia [name] 67 y.o. female with medical history significant for metastatic adenocarcinoma of the lung presents for a follow up visit. The patient's last visit was on 04/04/2023.  In the interim since the last visit she has continued on maintenance Pem/Pem.   On exam today Virginia [name] reports she has been well overall in the interim since her last visit.  She reports that since she left the hospital she has been on steroid therapy and is "eating everything in sight".  She also notes that she is using the bathroom a lot and is taking 40 mg of Lasix twice daily.  She reports that overall her pain is under good control and she feels much more awake on steroid therapy.  She notes that she is tolerating her chemotherapy treatment well with no difficulty.  She notes her dizziness is currently also under control.  She reports much improvement after her discharge from the hospital..  She denies any fevers, chills, sweats or chest pain. She has no other complaints.  Full 10 point ROS was otherwise negative  MEDICAL  HISTORY:  Past Medical History:  Diagnosis Date   Anemia    Anxiety    Concussion 09/28/2019   DVT (deep venous thrombosis) (HCC) 2021   L leg   Dyspnea    GERD (gastroesophageal reflux disease)    Hypercholesterolemia    per pt, she does not have elevated lipids   Hypertension    met lung ca dx'd 09/2019   mets to spine, hip and brain   PONV (postoperative nausea and vomiting)    Tobacco abuse     SURGICAL HISTORY: Past Surgical History:  Procedure Laterality Date   ABDOMINAL HYSTERECTOMY     partial/ left ovaries   APPLICATION OF CRANIAL NAVIGATION N/A 12/02/2020   Procedure: APPLICATION OF CRANIAL NAVIGATION;  Surgeon: Maeola Harman, MD;  Location: [facility] OR;  Service: Neurosurgery;  Laterality: N/A;   CHOLECYSTECTOMY     CRANIOTOMY N/A 12/02/2020   Procedure: Posterior fossa craniotomy for tumor resection with brainlab;  Surgeon: Maeola Harman, MD;  Location: [facility] OR;  Service: Neurosurgery;  Laterality: N/A;   DILATION AND CURETTAGE OF UTERUS     IR IMAGING GUIDED PORT INSERTION  10/23/2019   IR PATIENT EVAL TECH 0-60 MINS  11/21/2021   IR PATIENT EVAL TECH 0-60 MINS  11/24/2021   IR PATIENT EVAL TECH 0-60 MINS  11/29/2021   IR PATIENT EVAL TECH 0-60 MINS  12/04/2021   IR PATIENT EVAL TECH 0-60 MINS  12/12/2021   IR PATIENT EVAL TECH 0-60 MINS  12/19/2021   IR PATIENT EVAL TECH 0-60 MINS  12/27/2021   IR PATIENT EVAL TECH 0-60 MINS  01/03/2022   IR PATIENT EVAL TECH 0-60 MINS  01/10/2022   IR PATIENT EVAL TECH 0-60 MINS  01/18/2022   IR PATIENT EVAL TECH 0-60 MINS  02/13/2022   IR PATIENT EVAL TECH 0-60 MINS  02/20/2022   IR RADIOLOGIST EVAL & MGMT  01/23/2022   IR REMOVAL TUN ACCESS W/ PORT W/O FL MOD SED  11/17/2021   KYPHOPLASTY N/A 03/15/2020   Procedure: Thoracic Eight KYPHOPLASTY;  Surgeon: Maeola Harman, MD;  Location: [facility] OR;  Service: Neurosurgery;  Laterality: N/A;  prone    LIPOMA EXCISION  2018   removed under left breast and right thigh.   TUBAL LIGATION      ALLERGIES:  has  No Known Allergies.  MEDICATIONS:  Current Outpatient Medications  Medication Sig Dispense Refill   acetaminophen (TYLENOL) 500 MG tablet Take 500 mg by mouth 2 (two) times daily as needed for moderate pain.     albuterol (VENTOLIN HFA) 108 (90 Base) MCG/ACT inhaler INHALE 2 PUFFS INTO THE LUNGS EVERY 6 HOURS AS NEEDED FOR WHEEZING OR SHORTNESS OF BREATH 6.7 g 11   ALPRAZolam (XANAX) 0.25 MG tablet TAKE 1 TABLET(0.25 MG) BY MOUTH TWICE DAILY AS NEEDED FOR ANXIETY 60 tablet 0   amLODipine (NORVASC) 10 MG tablet TAKE 1 TABLET BY MOUTH EVERY DAY 90 tablet 3   Cyanocobalamin (VITAMIN B12 PO) Take 1 tablet by mouth daily. Gummies     dexamethasone (DECADRON) 4 MG tablet Take 1 tablet (4 mg total) by mouth every 12 (twelve) hours. 60 tablet 0   EPINEPHrine 0.3 mg/0.3 mL IJ SOAJ injection Inject 0.3 mg into the muscle as needed for anaphylaxis. 2 each 0   folic acid (FOLVITE) 1 MG tablet TAKE 1 TABLET BY MOUTH DAILY 90 tablet 1   furosemide (LASIX) 20 MG tablet Take 2 tablets (40 mg total) by mouth 2 times daily. 360 tablet 0   Glucosamine HCl (GLUCOSAMINE PO) Take 2 tablets by mouth daily.     LYSINE PO Take 2 tablets by mouth daily.     meclizine (ANTIVERT) 25 MG tablet Take 1 tablet (25 mg total) by mouth 3 (three) times daily as needed for dizziness. 30 tablet 0   Multiple Vitamin (MULTIVITAMIN WITH MINERALS) TABS tablet Take 1 tablet by mouth daily.     OLANZapine (ZYPREXA) 5 MG tablet Take 1 tablet (5 mg total) by mouth at bedtime. 30 tablet 1   OLANZapine zydis (ZYPREXA) 5 MG disintegrating tablet Take 0.5 tablets (2.5 mg total) by mouth daily. 30 tablet 1   ondansetron (ZOFRAN) 4 MG tablet TAKE 1 TABLET(4 MG) BY MOUTH EVERY 8 HOURS AS NEEDED FOR NAUSEA OR VOMITING 40 tablet 2   OVER THE COUNTER MEDICATION Take 2 tablets by mouth daily. Beet root     oxyCODONE (OXY IR/ROXICODONE) 5 MG immediate release tablet Take 1 tablet (5 mg total) by mouth every 4 (four) hours as needed for severe pain. 90  tablet 0   pantoprazole (PROTONIX) 40 MG tablet Take 1 tablet (40 mg total) by mouth daily. 90 tablet 3   polyethylene glycol (MIRALAX / GLYCOLAX) 17 g packet Take 17 g by mouth every evening.     potassium chloride SA (KLOR-CON M) 20 MEQ tablet Take 2 tablets (40 mEq total) by mouth 2 (two) times daily. 360 tablet 0   protein supplement (RESOURCE BENEPROTEIN) 6 g POWD Take 1  Scoop (6 g total) by mouth 3 (three) times daily with meals. 540 g 0   Tetrahydrozoline HCl (VISINE OP) Place 1 drop into both eyes daily as needed (irritation).     VITAMIN D PO Take 1 capsule by mouth every evening.     vitamin E 180 MG (400 UNITS) capsule Take 400 IU daily x 1 week, then 400 IU BID (Patient taking differently: Take 400 Units by mouth daily.) 60 capsule 4   XTAMPZA ER 13.5 MG C12A Take 1 capsule by mouth in the morning and at bedtime. (every 12 hours) 60 capsule 0   No current facility-administered medications for this visit.   Facility-Administered Medications Ordered in Other Visits  Medication Dose Route Frequency Provider Last Rate Last Admin   cyanocobalamin (VITAMIN B12) injection 1,000 mcg  1,000 mcg Intramuscular Once Ulysees Barns IV, MD       heparin lock flush 100 unit/mL  500 Units Intracatheter Once PRN Jaci Standard, MD       sodium chloride flush (NS) 0.9 % injection 10 mL  10 mL Intracatheter PRN Jaci Standard, MD        REVIEW OF SYSTEMS:   Constitutional: ( - ) fevers, ( - )  chills , ( - ) night sweats Eyes: ( - ) blurriness of vision, ( - ) double vision, ( - ) watery eyes Ears, nose, mouth, throat, and face: ( - ) mucositis, ( - ) sore throat Respiratory: ( - ) cough, ( + ) dyspnea, ( - ) wheezes Cardiovascular: ( - ) palpitation, ( - ) chest discomfort, ( - ) lower extremity swelling Gastrointestinal:  ( + ) nausea, ( - ) heartburn, ( - ) change in bowel habits Skin: ( - ) abnormal skin rashes Lymphatics: ( - ) new lymphadenopathy, ( - ) easy bruising Neurological: (  - ) numbness, ( - ) tingling, ( - ) new weaknesses Behavioral/Psych: ( - ) mood change, ( - ) new changes  All other systems were reviewed with the patient and are negative.  PHYSICAL EXAMINATION: ECOG PERFORMANCE STATUS: 1 - Symptomatic but completely ambulatory  Vitals:   05/17/23 1203  BP: (!) 135/92  Pulse: (!) 107  Resp: 16  Temp: 98 F (36.7 C)  SpO2: 99%      Filed Weights   05/17/23 1203  Weight: 142 lb 11.2 oz (64.7 kg)    GENERAL: well appearing middle aged Caucasian female in NAD  SKIN: skin color, texture, turgor are normal, no rashes or significant lesions EYES: conjunctiva are pink and non-injected, sclera clear LUNGS: wheezing at lung bases. clear to auscultation and percussion with normal breathing effort HEART: regular rate & rhythm and no murmurs and bilateral trace pitting lower extremity edema Musculoskeletal: no cyanosis of digits and no clubbing PSYCH: alert & oriented x 3, fluent speech NEURO: no focal motor/sensory deficits  LABORATORY DATA:  I have reviewed the data as listed    Latest Ref Rng & Units 05/17/2023   11:19 AM 05/09/2023    5:51 AM 05/08/2023    6:05 AM  CBC  WBC 4.0 - 10.5 K/uL 13.4  6.8  5.9   Hemoglobin 12.0 - 15.0 g/dL 16.1  9.0  09.6   Hematocrit 36.0 - 46.0 % 35.8  26.0  29.3   Platelets 150 - 400 K/uL 448  241  225        Latest Ref Rng & Units 05/17/2023   11:19 AM 05/09/2023  5:51 AM 05/08/2023    6:05 AM  CMP  Glucose 70 - 99 mg/dL 865  784  696   BUN 8 - 23 mg/dL 46  22  15   Creatinine 0.44 - 1.00 mg/dL 2.95  2.84  1.32   Sodium 135 - 145 mmol/L 136  137  141   Potassium 3.5 - 5.1 mmol/L 3.9  3.5  3.7   Chloride 98 - 111 mmol/L 98  106  109   CO2 22 - 32 mmol/L 27  24  24    Calcium 8.9 - 10.3 mg/dL 8.3  8.1  8.3   Total Protein 6.5 - 8.1 g/dL 6.5     Total Bilirubin 0.3 - 1.2 mg/dL 0.3     Alkaline Phos 38 - 126 U/L 50     AST 15 - 41 U/L 14     ALT 0 - 44 U/L 18        RADIOGRAPHIC STUDIES: I personally  have viewed the radiographic studies below: MR BRAIN WO CONTRAST  Result Date: 05/04/2023 CLINICAL DATA:  Brain metastases with neurologic changes. EXAM: MRI HEAD WITHOUT CONTRAST TECHNIQUE: Multiplanar, multiecho pulse sequences of the brain and surrounding structures were obtained without intravenous contrast. COMPARISON:  04/01/2023 FINDINGS: Brain: Known metastatic disease, underestimated relative to prior brain MRI in the absence of IV contrast. Fairly extensive vasogenic edema in the bilateral cerebellum, especially in the mid and superior portions. There has been a mild increase in edema at the right parietal lesion, although no increase in mass effect. Borderline increased vasogenic edema in the anterior left frontal lobe. No indication of acute hemorrhage, hydrocephalus, or collection. Vascular: Major flow voids are preserved Skull and upper cervical spine: Curved left occipital craniotomy site is unremarkable. Sinuses/Orbits: Negative. IMPRESSION: 1. No acute finding superimposed on the patient's cerebral metastatic disease. 2. Mild-to-moderate increase in vasogenic edema centered on a right parietal lesion. Fairly extensive cerebellar edema is unchanged. No hydrocephalus or shift. Electronically Signed   By: Tiburcio Pea M.D.   On: 05/04/2023 12:03   CT Head Wo Contrast  Result Date: 05/03/2023 CLINICAL DATA:  Altered mental status EXAM: CT HEAD WITHOUT CONTRAST TECHNIQUE: Contiguous axial images were obtained from the base of the skull through the vertex without intravenous contrast. RADIATION DOSE REDUCTION: This exam was performed according to the departmental dose-optimization program which includes automated exposure control, adjustment of the mA and/or kV according to patient size and/or use of iterative reconstruction technique. COMPARISON:  12/01/2020 head CT Brain MRI 04/01/2023 FINDINGS: Brain: Large area of vasogenic edema within the right parietal lobe with a small area of  calcification peripherally, unchanged from 04/01/2023. There are multiple other partially calcified lesions, most notably within the cerebellum that correspond to the previously demonstrated metastatic lesions. No acute hemorrhage or extra-axial collection. No midline shift or hydrocephalus. Vascular: Cavernous carotid calcification. Skull: Remote left occipital craniotomy Sinuses/Orbits: Negative Other: None IMPRESSION: Allowing for differences in modality, unchanged appearance of multiple cerebral and cerebellar metastases. Electronically Signed   By: Deatra Robinson M.D.   On: 05/03/2023 19:07   DG Chest Port 1 View  Result Date: 05/03/2023 CLINICAL DATA:  Shortness of breath, fever EXAM: PORTABLE CHEST 1 VIEW COMPARISON:  11/29/2021 FINDINGS: Cardiomegaly. Aortic atherosclerosis. No confluent airspace opacities or effusions. No edema. No acute bony abnormality. IMPRESSION: Cardiomegaly, no active disease. Electronically Signed   By: Charlett Nose M.D.   On: 05/03/2023 18:50     ASSESSMENT & PLAN KARSYNN PINKHAM is a  67 y.o. female with medical history significant for metastatic adenocarcinoma of the lung presents for a follow up visit. 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   Previously we discussed Carboplatin/Pembrolizumab/Pemetrexed and the expected side effect from these medications.  We discussed the possible side effects of immunotherapy including colitis, hepatitis, dermatitis, and pneumonitis.  Additionally we discussed the side effects of carboplatin which include suppression of blood counts, nephrotoxicity, and nausea.  Additionally we discussed pemetrexed which can also have effects on counts and would require supplementation with vitamin B12 and folic acid.  She voiced her understanding of these side effects and treatment plans moving forward.  I also noted that we would be able to drop the chemotherapy agents that she had intolerance to continue with  pembrolizumab alone given her high TPS score.  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 Due to worsening dizziness, she resumed this from Cycle 25-27.  --Most recent CT CAP from 02/16/2023 showed stable disease.  PLAN: --Due for Cycle 53, Day 1 of maintenance Pem/Pem today --Labs from today were reviewed and adequate for treatment.  Labs today show white blood cell count 13.4, hemoglobin 12.5, MCV 95.5, and platelets of 448 --Proceed with treatment today without any dose modifications.  --Last CT scan in June 2024 showed no evidence of progressive disease.  Repeat CT scan due at this time. --RTC q 3 weeks for labs, follow up visit prior to Cycle 54.    #Appetite loss/weight loss: --Encouraged high protein diet and supplement with protein shakes --Advised to eat small, frequent meals.   #Brain Metastasis, stable --appreciate the assistance of Dr. Barbaraann Cao in treating her brain metastasis and symptoms. --radiation therapy to the brain completed on 11/16/2019.  --MRI brain on 04/29/2020 showed evidence of small brain bleed. Eliquis therapy was discontinued.  --MRI on 11/04/2020 showed concern for progression of intracranial disease.  --On 12/02/2020, patient underwent craniotomy, resection of progressive cerebellar mass by Dr. Venetia Maxon. 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  --MRI on 12/23/2020 showed evidence of radiation necrosis. Dr. Barbaraann Cao evaluated the patient on 01/02/2021 with recommendations to add IV Bevacizumab 10 mg/kg q 3 weeks ( started 01/26/2021). Completed a full course of this therapy. --Patient was evaluated by Dr. Barbaraann Cao on 08/29/2021 and recommended to resume Bevacizumab due to worsening dizziness.  Which was given from Cycle 25-27.  Plan: --continue to follow with Dr.  Barbaraann Cao  --Most recent MRI from 04/01/2023 showed increased enhancement. Dr. 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 Plan to repeat MRI brain on 05/21/2023.  --last dose of Bevacizumab on 07/19/2022.   #Nausea/Vomiting, worsening --Regimen includes Zofran 4-8mg  q8h PRN and compazine 10mg  q6H for breakthrough PRN, Olanzpaine 2.5 mg PO QHS to help with nausea.  She is currently using Phenergan for breakthrough. --continue to monitor   #Hypokalemia --likely 2/2 to decreased PO intake, hypovolemia, and BP medications --Potassium level 3.9 today.   --Currently on potassium chloride tablets, 40 mEq PO potassium in AM and in PM.   #Pain Control, stable --continue oxycodone 5mg  q4H PRN. Can increase dose to 10mg  q6H if pain is severe. She is taking approximately 2-3 pills per day.  --continue Xtampza 13.5 mg ER BID for long acting support.  --  continue to take with Senokot to prevent opioid induced constipation. 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 Next dose due today.    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    # Hypertension: --Continue Norvasc to 10 mg daily.  --continue to monitor at home and in clinic.    Orders Placed This Encounter  Procedures   CT CHEST ABDOMEN PELVIS W CONTRAST    Standing Status:   Future    Standing Expiration Date:   05/16/2024    Order Specific Question:   If indicated for the ordered procedure, I authorize the administration of contrast media per Radiology protocol    Answer:   Yes    Order Specific Question:   Does the patient have a contrast media/X-ray dye allergy?    Answer:   No    Order Specific Question:   Preferred imaging location?     Answer:   [facility]    Order Specific Question:   If indicated for the ordered procedure, I authorize the administration of oral contrast media per Radiology protocol    Answer:   Yes    All questions were answered. The patient knows to call the clinic with any problems, questions or concerns.  I have spent a total of 30 minutes minutes of face-to-face and non-face-to-face time, preparing to see the patient, performing a medically appropriate examination, counseling and educating the patient,  documenting clinical information in the electronic health record, and care coordination.   Ulysees Barns, MD Department of Hematology/Oncology [facility] Phone: [phone] Pager: [phone] Email: [email or web address]  05/17/2023 3:49 PM   Literature Support:  Lynn Ito, Rodrguez-Abreu D, Naaman Plummer, Felip E, De Angelis F, Domine M, [place], Hochmair MJ, [place], [place], [address], [place], Grossi F, [place], Reck M, [place], [place], [place], Rubio-Viqueira B, Novello S, Kurata T, Gray JE, Vida J, Wei Z, Yang J, Raftopoulos H, [place], [place] [employer]; [id number] Investigators. Pembrolizumab plus Chemotherapy in Metastatic Non-Small-Cell Lung Cancer. Malva Limes Med. 2018 May 31;378(22):2078-2092.  --Median progression-free survival was 8.8 months (95% CI, 7.6 to 9.2) in the pembrolizumab-combination group and 4.9 months (95% CI, 4.7 to 5.5) in the placebo-combination group (hazard ratio for disease progression or death, 0.52; 95% CI, 0.43 to 0.64; P<0.001). Adverse events of grade 3 or higher occurred in 67.2% of the patients in the pembrolizumab-combination group and in 65.8% of those in the placebo-combination group.  Cristal Generous L. Efficacy of pemetrexed-based regimens in advanced non-small cell lung cancer patients with activating epidermal growth factor receptor mutations after tyrosine kinase  inhibitor failure: a systematic review. Onco Targets Ther. 2018;11:2121-2129.   --The weighted median PFS, median OS, and ORR for patients treated with pem regimens were 5.09 months, 15.91 months, and 30.19%, respectively. Our systematic review results showed a favorable efficacy profile of pem regimens in NSCLC patients with EGFR mutation after EGFR-TKI failure.

## 2023-05-21 ENCOUNTER — Ambulatory Visit (HOSPITAL_COMMUNITY)
Admission: RE | Admit: 2023-05-21 | Discharge: 2023-05-21 | Disposition: A | Discharge status: Home / Self Care | Payer: Medicare Other | Source: Ambulatory Visit | Attending: Internal Medicine | Admitting: Internal Medicine

## 2023-05-21 DIAGNOSIS — C7931 Secondary malignant neoplasm of brain: Secondary | ICD-10-CM | POA: Diagnosis present

## 2023-05-21 MED ORDER — GADOBUTROL 1 MMOL/ML IV SOLN
7.0000 mL | Freq: Once | INTRAVENOUS | Status: AC | PRN
  Administered 2023-05-21: 7 mL via INTRAVENOUS

## 2023-05-23 ENCOUNTER — Other Ambulatory Visit (HOSPITAL_COMMUNITY): Payer: Self-pay

## 2023-05-23 ENCOUNTER — Inpatient Hospital Stay (HOSPITAL_BASED_OUTPATIENT_CLINIC_OR_DEPARTMENT_OTHER): Payer: Medicare Other | Admitting: Internal Medicine

## 2023-05-23 ENCOUNTER — Ambulatory Visit (HOSPITAL_COMMUNITY)
Admission: RE | Admit: 2023-05-23 | Discharge: 2023-05-23 | Disposition: A | Discharge status: Home / Self Care | Payer: Medicare Other | Source: Ambulatory Visit | Attending: Hematology and Oncology | Admitting: Hematology and Oncology

## 2023-05-23 VITALS — BP 123/95 | HR 106 | Temp 98.1°F | Resp 18 | Wt 140.6 lb

## 2023-05-23 DIAGNOSIS — I6789 Other cerebrovascular disease: Secondary | ICD-10-CM

## 2023-05-23 DIAGNOSIS — Y842 Radiological procedure and radiotherapy as the cause of abnormal reaction of the patient, or of later complication, without mention of misadventure at the time of the procedure: Secondary | ICD-10-CM | POA: Diagnosis not present

## 2023-05-23 DIAGNOSIS — C3491 Malignant neoplasm of unspecified part of right bronchus or lung: Secondary | ICD-10-CM

## 2023-05-23 DIAGNOSIS — C7931 Secondary malignant neoplasm of brain: Secondary | ICD-10-CM

## 2023-05-23 MED ORDER — IOHEXOL 300 MG/ML  SOLN
100.0000 mL | Freq: Once | INTRAMUSCULAR | Status: AC | PRN
  Administered 2023-05-23: 100 mL via INTRAVENOUS

## 2023-05-23 MED ORDER — DEXAMETHASONE 1 MG PO TABS
ORAL_TABLET | ORAL | 0 refills | Status: DC
  Filled 2023-05-23: qty 42, 21d supply, fill #0

## 2023-05-23 NOTE — Progress Notes (Signed)
[facility] at [facility] [address]  [place], [place] [address] [phone]   Interval Evaluation  Date of Service: 05/23/23 Patient Name: Virginia [name] Patient MRN: [id number] Patient DOB: [birth year] Provider: Henreitta Leber, MD  Identifying Statement:  Virginia [name] is a 67 y.o. 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 Basilio Cairo 12/02/20: Craniotomy, resection of progressive cerebellar mass by Dr. Venetia Maxon.  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  She feels improved overall on the decadron, which remains at 4mg  twice per day.  Otherwise continues to experience stable burden of dizziness, vertigo which limits her ability to walk and perform activities of daily living.  She is working with PT now.  Sleep burden is lessened, more awake time.  Continues on Pem/pem with Dr. [name].  No seizures or headaches.  H+P (10/15/19) Patient presents to clinic to discuss recent neurologic symptoms and imaging findings.  She describes a fall while climbing stairs on 1/20; she hit the back of her and lost consciousness.  She is unclear whether she lost her balance or had mechanical provocation, but she had been drinking that evening.  She was treated initially as a trauma, CT demonstrated a lesion in the posterior fossa, later confirmed as a metastasis by MRI, along with 7 additional smaller lesions.  It took her almost two weeks to return for cognition and balance to return to "normal" which she feels she is about at right now.  Baseline she is high functioning and independent, works as a Haematologist.  Has been taking decadron 2mg  twice per  day.  Plans to initiate chemotherapy soon with Dr. [name].  Medications: Current Outpatient Medications on File Prior to Visit  Medication Sig Dispense Refill   acetaminophen (TYLENOL) 500 MG tablet Take 500 mg by mouth 2 (two) times daily as needed for moderate pain.     albuterol (VENTOLIN HFA) 108 (90 Base) MCG/ACT inhaler INHALE 2 PUFFS INTO THE LUNGS EVERY 6 HOURS AS NEEDED FOR WHEEZING OR SHORTNESS OF BREATH 6.7 g 11   ALPRAZolam (XANAX) 0.25 MG tablet TAKE 1 TABLET(0.25 MG) BY MOUTH TWICE DAILY AS NEEDED FOR ANXIETY 60 tablet 0   amLODipine (NORVASC) 10 MG tablet TAKE 1 TABLET BY MOUTH EVERY DAY 90 tablet 3   Cyanocobalamin (VITAMIN B12 PO) Take 1 tablet by mouth daily. Gummies     dexamethasone (DECADRON) 4 MG tablet Take 1 tablet (4 mg total) by mouth every 12 (twelve) hours. 60 tablet 0   EPINEPHrine 0.3 mg/0.3 mL IJ SOAJ injection Inject 0.3 mg into the muscle as needed for anaphylaxis. 2 each 0   folic acid (FOLVITE) 1 MG tablet TAKE 1 TABLET BY MOUTH DAILY 90 tablet 1   furosemide (LASIX) 20 MG tablet Take 2 tablets (40 mg total) by mouth 2 times daily. 360 tablet 0   Glucosamine HCl (GLUCOSAMINE PO) Take 2 tablets by mouth daily.     LYSINE PO Take 2 tablets by mouth daily.     meclizine (ANTIVERT) 25 MG tablet Take 1 tablet (25 mg total) by mouth 3 (three) times daily as needed for dizziness. 30 tablet 0   Multiple Vitamin (MULTIVITAMIN WITH MINERALS) TABS tablet  Take 1 tablet by mouth daily.     OLANZapine (ZYPREXA) 5 MG tablet Take 1 tablet (5 mg total) by mouth at bedtime. 30 tablet 1   OLANZapine zydis (ZYPREXA) 5 MG disintegrating tablet Take 0.5 tablets (2.5 mg total) by mouth daily. 30 tablet 1   ondansetron (ZOFRAN) 4 MG tablet TAKE 1 TABLET(4 MG) BY MOUTH EVERY 8 HOURS AS NEEDED FOR NAUSEA OR VOMITING 40 tablet 2   OVER THE COUNTER MEDICATION Take 2 tablets by mouth daily. Beet root     oxyCODONE (OXY IR/ROXICODONE) 5 MG immediate release tablet Take 1 tablet (5 mg  total) by mouth every 4 (four) hours as needed for severe pain. 90 tablet 0   pantoprazole (PROTONIX) 40 MG tablet Take 1 tablet (40 mg total) by mouth daily. 90 tablet 3   polyethylene glycol (MIRALAX / GLYCOLAX) 17 g packet Take 17 g by mouth every evening.     potassium chloride SA (KLOR-CON M) 20 MEQ tablet Take 2 tablets (40 mEq total) by mouth 2 (two) times daily. 360 tablet 0   protein supplement (RESOURCE BENEPROTEIN) 6 g POWD Take 1 Scoop (6 g total) by mouth 3 (three) times daily with meals. 540 g 0   Tetrahydrozoline HCl (VISINE OP) Place 1 drop into both eyes daily as needed (irritation).     VITAMIN D PO Take 1 capsule by mouth every evening.     vitamin E 180 MG (400 UNITS) capsule Take 400 IU daily x 1 week, then 400 IU BID (Patient taking differently: Take 400 Units by mouth daily.) 60 capsule 4   XTAMPZA ER 13.5 MG C12A Take 1 capsule by mouth in the morning and at bedtime. (every 12 hours) 60 capsule 0   No current facility-administered medications on file prior to visit.    Allergies: No Known Allergies Past Medical History:  Past Medical History:  Diagnosis Date   Anemia    Anxiety    Concussion 09/28/2019   DVT (deep venous thrombosis) (HCC) 2021   L leg   Dyspnea    GERD (gastroesophageal reflux disease)    Hypercholesterolemia    per pt, she does not have elevated lipids   Hypertension    met lung ca dx'd 09/2019   mets to spine, hip and brain   PONV (postoperative nausea and vomiting)    Tobacco abuse    Past Surgical History:  Past Surgical History:  Procedure Laterality Date   ABDOMINAL HYSTERECTOMY     partial/ left ovaries   APPLICATION OF CRANIAL NAVIGATION N/A 12/02/2020   Procedure: APPLICATION OF CRANIAL NAVIGATION;  Surgeon: Maeola Harman, MD;  Location: [facility] OR;  Service: Neurosurgery;  Laterality: N/A;   CHOLECYSTECTOMY     CRANIOTOMY N/A 12/02/2020   Procedure: Posterior fossa craniotomy for tumor resection with brainlab;  Surgeon: Maeola Harman, MD;  Location: [facility] OR;  Service: Neurosurgery;  Laterality: N/A;   DILATION AND CURETTAGE OF UTERUS     IR IMAGING GUIDED PORT INSERTION  10/23/2019   IR PATIENT EVAL TECH 0-60 MINS  11/21/2021   IR PATIENT EVAL TECH 0-60 MINS  11/24/2021   IR PATIENT EVAL TECH 0-60 MINS  11/29/2021   IR PATIENT EVAL TECH 0-60 MINS  12/04/2021   IR PATIENT EVAL TECH 0-60 MINS  12/12/2021   IR PATIENT EVAL TECH 0-60 MINS  12/19/2021   IR PATIENT EVAL TECH 0-60 MINS  12/27/2021   IR PATIENT EVAL TECH 0-60 MINS  01/03/2022   IR PATIENT  EVAL TECH 0-60 MINS  01/10/2022   IR PATIENT EVAL TECH 0-60 MINS  01/18/2022   IR PATIENT EVAL TECH 0-60 MINS  02/13/2022   IR PATIENT EVAL TECH 0-60 MINS  02/20/2022   IR RADIOLOGIST EVAL & MGMT  01/23/2022   IR REMOVAL TUN ACCESS W/ PORT W/O FL MOD SED  11/17/2021   KYPHOPLASTY N/A 03/15/2020   Procedure: Thoracic Eight KYPHOPLASTY;  Surgeon: Maeola Harman, MD;  Location: [facility] OR;  Service: Neurosurgery;  Laterality: N/A;  prone    LIPOMA EXCISION  2018   removed under left breast and right thigh.   TUBAL LIGATION     Social History:  Social History   Socioeconomic History   Marital status: Married    Spouse name: Not on file   Number of children: 3   Years of education: Not on file   Highest education level: Not on file  Occupational History   Occupation: owns Editor, commissioning [employer]    Employer: [employer]  Tobacco Use   Smoking status: Former    Current packs/day: 0.00    Types: Cigarettes    Quit date: 10/31/2012    Years since quitting: 10.5   Smokeless tobacco: Never  Vaping Use   Vaping status: Never Used  Substance and Sexual Activity   Alcohol use: Yes    Alcohol/week: 0.0 standard drinks of alcohol    Comment: rare   Drug use: No   Sexual activity: Yes  Other Topics Concern   Not on file  Social History Narrative   Not on file   Social Determinants of Health   Financial Resource Strain: Not on file  Food Insecurity: No Food Insecurity (05/04/2023)   Hunger  Vital Sign    Worried About Running Out of Food in the Last Year: Never true    Ran Out of Food in the Last Year: Never true  Transportation Needs: No Transportation Needs (05/04/2023)   PRAPARE - Administrator, Civil Service (Medical): No    Lack of Transportation (Non-Medical): No  Physical Activity: Not on file  Stress: Not on file  Social Connections: Not on file  Intimate Partner Violence: Not At Risk (05/04/2023)   Humiliation, Afraid, Rape, and Kick questionnaire    Fear of Current or Ex-Partner: No    Emotionally Abused: No    Physically Abused: No    Sexually Abused: No   Family History:  Family History  Problem Relation Age of Onset   Heart disease Father    Drug abuse Daughter    Drug abuse Son    Cancer Sister    Heart disease Brother    Heart attack Brother     Review of Systems: Constitutional: Doesn't report fevers, chills or abnormal weight loss Eyes: Doesn't report blurriness of vision Ears, nose, mouth, throat, and face: Doesn't report sore throat Respiratory: Doesn't report cough, dyspnea or wheezes Cardiovascular: Doesn't report palpitation, chest discomfort  Gastrointestinal:  Doesn't report nausea, constipation, diarrhea GU: Doesn't report incontinence Skin: Doesn't report skin rashes Neurological: Per HPI Musculoskeletal: Doesn't report joint pain Behavioral/Psych: Doesn't report anxiety  Physical Exam: Wt Readings from Last 3 Encounters:  05/23/23 140 lb 9.6 oz (63.8 kg)  05/17/23 142 lb 11.2 oz (64.7 kg)  05/16/23 143 lb (64.9 kg)   Temp Readings from Last 3 Encounters:  05/23/23 98.1 F (36.7 C)  05/17/23 98 F (36.7 C) (Oral)  05/16/23 98.1 F (36.7 C)   BP Readings from Last 3 Encounters:  05/23/23 Virginia [name])  123/95  05/17/23 116/82  05/17/23 (!) 135/92   Pulse Readings from Last 3 Encounters:  05/23/23 (!) 106  05/17/23 92  05/17/23 (!) 107    KPS: 60. General: Wheelchair bound  Head: Normal EENT: No conjunctival  injection or scleral icterus.  Lungs: Resp effort normal Cardiac: Regular rate Abdomen: Non-distended abdomen Skin: No rashes cyanosis or petechiae. Extremities: No clubbing or edema  Neurologic Exam: Mental Status: Awake, alert, attentive to examiner. Oriented to self and environment. Language is fluent with intact comprehension.  Cranial Nerves: Visual acuity is grossly normal. Noted dysarthria. Visual fields are full. Extra-ocular movements intact. No ptosis. Face is symmetric Motor: Tone and bulk are normal. Power is full in both arms and legs. Reflexes are symmetric, no pathologic reflexes present.  Dense dysmetria, coordination impaired bilaterally. Sensory: Intact to light touch Gait: Non ambulatory today  Labs: I have reviewed the data as listed    Component Value Date/Time   NA 136 05/17/2023 1119   K 3.9 05/17/2023 1119   CL 98 05/17/2023 1119   CO2 27 05/17/2023 1119   GLUCOSE 286 (H) 05/17/2023 1119   BUN 46 (H) 05/17/2023 1119   CREATININE 1.47 (H) 05/17/2023 1119   CREATININE 0.58 11/15/2017 0948   CALCIUM 8.3 (L) 05/17/2023 1119   PROT 6.5 05/17/2023 1119   ALBUMIN 3.0 (L) 05/17/2023 1119   AST 14 (L) 05/17/2023 1119   ALT 18 05/17/2023 1119   ALKPHOS 50 05/17/2023 1119   BILITOT 0.3 05/17/2023 1119   GFRNONAA 39 (L) 05/17/2023 1119   GFRNONAA 100 11/15/2017 0948   GFRAA >60 06/09/2020 1051   GFRAA 116 11/15/2017 0948   Lab Results  Component Value Date   WBC 13.4 (H) 05/17/2023   NEUTROABS 10.8 (H) 05/17/2023   HGB 12.5 05/17/2023   HCT 35.8 (L) 05/17/2023   MCV 95.5 05/17/2023   PLT 448 (H) 05/17/2023    Imaging:  [facility] Clinician Interpretation: I have personally reviewed the CNS images as listed.  My interpretation, in the context of the patient's clinical presentation, is stable disease  Pending official read  MR BRAIN WO CONTRAST  Result Date: 05/04/2023 CLINICAL DATA:  Brain metastases with neurologic changes. EXAM: MRI HEAD WITHOUT CONTRAST  TECHNIQUE: Multiplanar, multiecho pulse sequences of the brain and surrounding structures were obtained without intravenous contrast. COMPARISON:  04/01/2023 FINDINGS: Brain: Known metastatic disease, underestimated relative to prior brain MRI in the absence of IV contrast. Fairly extensive vasogenic edema in the bilateral cerebellum, especially in the mid and superior portions. There has been a mild increase in edema at the right parietal lesion, although no increase in mass effect. Borderline increased vasogenic edema in the anterior left frontal lobe. No indication of acute hemorrhage, hydrocephalus, or collection. Vascular: Major flow voids are preserved Skull and upper cervical spine: Curved left occipital craniotomy site is unremarkable. Sinuses/Orbits: Negative. IMPRESSION: 1. No acute finding superimposed on the patient's cerebral metastatic disease. 2. Mild-to-moderate increase in vasogenic edema centered on a right parietal lesion. Fairly extensive cerebellar edema is unchanged. No hydrocephalus or shift. Electronically Signed   By: Tiburcio Pea M.D.   On: 05/04/2023 12:03   CT Head Wo Contrast  Result Date: 05/03/2023 CLINICAL DATA:  Altered mental status EXAM: CT HEAD WITHOUT CONTRAST TECHNIQUE: Contiguous axial images were obtained from the base of the skull through the vertex without intravenous contrast. RADIATION DOSE REDUCTION: This exam was performed according to the departmental dose-optimization program which includes automated exposure control, adjustment of the mA  and/or kV according to patient size and/or use of iterative reconstruction technique. COMPARISON:  12/01/2020 head CT Brain MRI 04/01/2023 FINDINGS: Brain: Large area of vasogenic edema within the right parietal lobe with a small area of calcification peripherally, unchanged from 04/01/2023. There are multiple other partially calcified lesions, most notably within the cerebellum that correspond to the previously demonstrated  metastatic lesions. No acute hemorrhage or extra-axial collection. No midline shift or hydrocephalus. Vascular: Cavernous carotid calcification. Skull: Remote left occipital craniotomy Sinuses/Orbits: Negative Other: None IMPRESSION: Allowing for differences in modality, unchanged appearance of multiple cerebral and cerebellar metastases. Electronically Signed   By: Deatra Robinson M.D.   On: 05/03/2023 19:07   DG Chest Port 1 View  Result Date: 05/03/2023 CLINICAL DATA:  Shortness of breath, fever EXAM: PORTABLE CHEST 1 VIEW COMPARISON:  11/29/2021 FINDINGS: Cardiomegaly. Aortic atherosclerosis. No confluent airspace opacities or effusions. No edema. No acute bony abnormality. IMPRESSION: Cardiomegaly, no active disease. Electronically Signed   By: Charlett Nose M.D.   On: 05/03/2023 18:50     Assessment/Plan Metastasis to brain [facility])  Radiation therapy induced brain necrosis   Virginia [name] is clinically stable or improved today.  MRI demonstrates improvement in FLAIR signal burden within right parietal lobe, which had worsened significantly in August.  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  Improvement is attributed to corticosteroids.  Recommended decreasing decadron to 4mg  daily x7 days, then decreasing further by 1mg  each week until discontinued, if tolerated.  Should continue to work with PT.  Will con't to follow with [facility] for systemic therapy.    For vertigo, can con't meclizine dose 50mg  for PRN TID if helpful.    We appreciate the opportunity to participate in the care of Virginia [name].    We ask that Virginia [name] return to clinic in 3 months following next brain MRI, or sooner as needed.  All questions were answered. The patient knows to call the clinic with any problems, questions or concerns. No barriers to learning were detected.  The total time spent in the encounter was 40 minutes and more than 50% was on counseling and review of test results   Henreitta Leber, MD Medical Director of Neuro-Oncology [facility] at [facility] 05/23/23 4:15 PM

## 2023-05-24 ENCOUNTER — Telehealth: Payer: Self-pay

## 2023-05-24 ENCOUNTER — Telehealth: Payer: Self-pay | Admitting: Internal Medicine

## 2023-05-24 ENCOUNTER — Other Ambulatory Visit (HOSPITAL_COMMUNITY): Payer: Self-pay

## 2023-05-24 MED ORDER — HYDROCORTISONE (PERIANAL) 2.5 % EX CREA: TOPICAL_CREAM | CUTANEOUS | 99 refills | Status: DC

## 2023-05-24 NOTE — Telephone Encounter (Addendum)
TC from pt's husband to clarify dose of Decadron that Dr. Barbaraann Cao wants pt to be taking over the next week. After reviewing Dr. Liana Gerold office note from yesterday, advised to take Decadron 4 mg daily for the next 7 days, then to decrease Decadron dose by 1 mg each week until prescription is complete (if tolerated), which means she is to take Decadron 3 mg daily the following week, and Decadron 2 mg daily the week after that, etc. Pt's husband verbalizes understanding.

## 2023-05-24 NOTE — Telephone Encounter (Signed)
Patient has been dealing with a hemorrhoid for a few days now; Patient's husband states she would like a refill on previous hemorrhoid cream that use to work well for her- cannot remember the name. Patient would like this prescription sen to [facility] pharmacy   [facility] [phone] - [place], [place] - [address]   Please advise

## 2023-05-25 NOTE — Patient Instructions (Signed)
We are glad you are back home now.  Please let us know if we can order any portable equipment or assist you in any way at all.  It was good to speak with you.

## 2023-05-27 DIAGNOSIS — C3490 Malignant neoplasm of unspecified part of unspecified bronchus or lung: Secondary | ICD-10-CM | POA: Diagnosis not present

## 2023-05-27 DIAGNOSIS — C7951 Secondary malignant neoplasm of bone: Secondary | ICD-10-CM | POA: Diagnosis not present

## 2023-05-27 DIAGNOSIS — C7931 Secondary malignant neoplasm of brain: Secondary | ICD-10-CM | POA: Diagnosis not present

## 2023-05-27 DIAGNOSIS — N179 Acute kidney failure, unspecified: Secondary | ICD-10-CM

## 2023-05-27 DIAGNOSIS — I6789 Other cerebrovascular disease: Secondary | ICD-10-CM | POA: Diagnosis not present

## 2023-05-28 ENCOUNTER — Telehealth: Payer: Self-pay | Admitting: Internal Medicine

## 2023-05-28 NOTE — Telephone Encounter (Signed)
-------  Fax Transmission Report-------  To:               Recipient at [phone] Subject:          Fw: Hp Scans Result:           The transmission was successful. Explanation:      All Pages Ok Pages Sent:       7 Connect Time:     5 minutes, 46 seconds Transmit Time:    05/28/2023 13:28 Transfer Rate:    14400 Status Code:      0000 Retry Count:      0 Job Id:           [id number] Unique Id:        [id number] Fax Line:         36 Fax Server:       [id number]

## 2023-05-28 NOTE — Telephone Encounter (Signed)
Received orders from [facility] to sign and fax back [phone] and [phone]  Order # [id number]

## 2023-05-28 NOTE — Telephone Encounter (Signed)
-------  Fax Transmission Report-------  To:               Recipient at [phone] Subject:          Fw: Hp Scans Result:           The transmission was successful. Explanation:      All Pages Ok Pages Sent:       3 Connect Time:     1 minutes, 36 seconds Transmit Time:    05/28/2023 11:34 Transfer Rate:    14400 Status Code:      0000 Retry Count:      0 Job Id:           [id number] Unique Id:        [id number] Fax Line:         6 Fax Server:       [employer]

## 2023-05-28 NOTE — Telephone Encounter (Signed)
-------  Fax Transmission Report-------  To:               Recipient at [phone] Subject:          Fw: Hp Scans Result:           The transmission was successful. Explanation:      All Pages Ok Pages Sent:       7 Connect Time:     5 minutes, 40 seconds Transmit Time:    05/28/2023 13:29 Transfer Rate:    14400 Status Code:      0000 Retry Count:      0 Job Id:           [id number] Unique Id:        [id number] Fax Line:         35 Fax Server:       [employer]

## 2023-05-28 NOTE — Telephone Encounter (Signed)
-------  Fax Transmission Report-------  To:               Recipient at [phone] Subject:          Fw: Hp Scans Result:           The transmission was successful. Explanation:      All Pages Ok Pages Sent:       3 Connect Time:     1 minutes, 19 seconds Transmit Time:    05/28/2023 11:34 Transfer Rate:    14400 Status Code:      0000 Retry Count:      0 Job Id:           [id number] Unique Id:        [id number] Fax Line:         22 Fax Server:       [employer]

## 2023-05-28 NOTE — Telephone Encounter (Signed)
Received Home Health Certification and Plan of Care from [facility] to review and sign. Fax back to [phone] and [phone]  Order # [id number]  Certification period 05/12/2023 to 07/10/2023

## 2023-05-31 ENCOUNTER — Telehealth: Payer: Self-pay | Admitting: Internal Medicine

## 2023-05-31 NOTE — Telephone Encounter (Signed)
Windell Moment [facility] [phone]  Junious Dresser called to get verbal orders for Occupation Therapy. I let her know we do not do verbal orders, however Dr Lenord Fellers would approve them.  She would just need to have some fax them when they can and she will sign them.

## 2023-06-03 ENCOUNTER — Inpatient Hospital Stay (HOSPITAL_COMMUNITY)
Admission: EM | Admit: 2023-06-03 | Discharge: 2023-06-10 | DRG: 308 | Disposition: A | Discharge status: Rehabilitation Facility | Payer: Medicare Other | Attending: Internal Medicine | Admitting: Internal Medicine

## 2023-06-03 ENCOUNTER — Other Ambulatory Visit: Payer: Self-pay

## 2023-06-03 ENCOUNTER — Inpatient Hospital Stay (HOSPITAL_COMMUNITY): Payer: Medicare Other

## 2023-06-03 ENCOUNTER — Emergency Department (HOSPITAL_COMMUNITY): Payer: Medicare Other

## 2023-06-03 ENCOUNTER — Encounter (HOSPITAL_COMMUNITY): Payer: Self-pay | Admitting: Emergency Medicine

## 2023-06-03 DIAGNOSIS — E1165 Type 2 diabetes mellitus with hyperglycemia: Secondary | ICD-10-CM | POA: Diagnosis not present

## 2023-06-03 DIAGNOSIS — J9601 Acute respiratory failure with hypoxia: Secondary | ICD-10-CM | POA: Diagnosis present

## 2023-06-03 DIAGNOSIS — C3490 Malignant neoplasm of unspecified part of unspecified bronchus or lung: Secondary | ICD-10-CM | POA: Diagnosis not present

## 2023-06-03 DIAGNOSIS — R739 Hyperglycemia, unspecified: Secondary | ICD-10-CM | POA: Diagnosis present

## 2023-06-03 DIAGNOSIS — R5381 Other malaise: Secondary | ICD-10-CM | POA: Diagnosis not present

## 2023-06-03 DIAGNOSIS — R000 Tachycardia, unspecified: Secondary | ICD-10-CM | POA: Diagnosis not present

## 2023-06-03 DIAGNOSIS — E8809 Other disorders of plasma-protein metabolism, not elsewhere classified: Secondary | ICD-10-CM | POA: Diagnosis present

## 2023-06-03 DIAGNOSIS — E7800 Pure hypercholesterolemia, unspecified: Secondary | ICD-10-CM | POA: Diagnosis present

## 2023-06-03 DIAGNOSIS — G936 Cerebral edema: Secondary | ICD-10-CM | POA: Diagnosis present

## 2023-06-03 DIAGNOSIS — R5383 Other fatigue: Secondary | ICD-10-CM | POA: Diagnosis not present

## 2023-06-03 DIAGNOSIS — M549 Dorsalgia, unspecified: Secondary | ICD-10-CM | POA: Diagnosis present

## 2023-06-03 DIAGNOSIS — R569 Unspecified convulsions: Secondary | ICD-10-CM

## 2023-06-03 DIAGNOSIS — E162 Hypoglycemia, unspecified: Secondary | ICD-10-CM | POA: Diagnosis not present

## 2023-06-03 DIAGNOSIS — E876 Hypokalemia: Secondary | ICD-10-CM | POA: Diagnosis present

## 2023-06-03 DIAGNOSIS — R601 Generalized edema: Secondary | ICD-10-CM | POA: Diagnosis not present

## 2023-06-03 DIAGNOSIS — I129 Hypertensive chronic kidney disease with stage 1 through stage 4 chronic kidney disease, or unspecified chronic kidney disease: Secondary | ICD-10-CM | POA: Diagnosis present

## 2023-06-03 DIAGNOSIS — I462 Cardiac arrest due to underlying cardiac condition: Secondary | ICD-10-CM | POA: Diagnosis present

## 2023-06-03 DIAGNOSIS — I4901 Ventricular fibrillation: Secondary | ICD-10-CM | POA: Diagnosis present

## 2023-06-03 DIAGNOSIS — J690 Pneumonitis due to inhalation of food and vomit: Secondary | ICD-10-CM | POA: Diagnosis present

## 2023-06-03 DIAGNOSIS — N179 Acute kidney failure, unspecified: Secondary | ICD-10-CM | POA: Diagnosis not present

## 2023-06-03 DIAGNOSIS — M79605 Pain in left leg: Secondary | ICD-10-CM | POA: Diagnosis not present

## 2023-06-03 DIAGNOSIS — Z90710 Acquired absence of both cervix and uterus: Secondary | ICD-10-CM

## 2023-06-03 DIAGNOSIS — Z85118 Personal history of other malignant neoplasm of bronchus and lung: Secondary | ICD-10-CM | POA: Diagnosis not present

## 2023-06-03 DIAGNOSIS — G4700 Insomnia, unspecified: Secondary | ICD-10-CM | POA: Diagnosis not present

## 2023-06-03 DIAGNOSIS — Z9049 Acquired absence of other specified parts of digestive tract: Secondary | ICD-10-CM

## 2023-06-03 DIAGNOSIS — D630 Anemia in neoplastic disease: Secondary | ICD-10-CM | POA: Diagnosis present

## 2023-06-03 DIAGNOSIS — I469 Cardiac arrest, cause unspecified: Principal | ICD-10-CM | POA: Diagnosis present

## 2023-06-03 DIAGNOSIS — C7931 Secondary malignant neoplasm of brain: Secondary | ICD-10-CM | POA: Diagnosis present

## 2023-06-03 DIAGNOSIS — M96A3 Multiple fractures of ribs associated with chest compression and cardiopulmonary resuscitation: Secondary | ICD-10-CM | POA: Diagnosis present

## 2023-06-03 DIAGNOSIS — R42 Dizziness and giddiness: Secondary | ICD-10-CM | POA: Diagnosis not present

## 2023-06-03 DIAGNOSIS — K5900 Constipation, unspecified: Secondary | ICD-10-CM | POA: Diagnosis present

## 2023-06-03 DIAGNOSIS — M79604 Pain in right leg: Secondary | ICD-10-CM | POA: Diagnosis not present

## 2023-06-03 DIAGNOSIS — R339 Retention of urine, unspecified: Secondary | ICD-10-CM | POA: Diagnosis not present

## 2023-06-03 DIAGNOSIS — Z86718 Personal history of other venous thrombosis and embolism: Secondary | ICD-10-CM

## 2023-06-03 DIAGNOSIS — I2510 Atherosclerotic heart disease of native coronary artery without angina pectoris: Secondary | ICD-10-CM | POA: Diagnosis not present

## 2023-06-03 DIAGNOSIS — Z8674 Personal history of sudden cardiac arrest: Secondary | ICD-10-CM | POA: Diagnosis not present

## 2023-06-03 DIAGNOSIS — Z87891 Personal history of nicotine dependence: Secondary | ICD-10-CM

## 2023-06-03 DIAGNOSIS — Z7189 Other specified counseling: Secondary | ICD-10-CM | POA: Diagnosis not present

## 2023-06-03 DIAGNOSIS — G8929 Other chronic pain: Secondary | ICD-10-CM | POA: Diagnosis present

## 2023-06-03 DIAGNOSIS — E871 Hypo-osmolality and hyponatremia: Secondary | ICD-10-CM | POA: Diagnosis present

## 2023-06-03 DIAGNOSIS — Z8249 Family history of ischemic heart disease and other diseases of the circulatory system: Secondary | ICD-10-CM | POA: Diagnosis not present

## 2023-06-03 DIAGNOSIS — D62 Acute posthemorrhagic anemia: Secondary | ICD-10-CM | POA: Diagnosis not present

## 2023-06-03 DIAGNOSIS — I3139 Other pericardial effusion (noninflammatory): Secondary | ICD-10-CM | POA: Diagnosis present

## 2023-06-03 DIAGNOSIS — C7951 Secondary malignant neoplasm of bone: Secondary | ICD-10-CM | POA: Diagnosis present

## 2023-06-03 DIAGNOSIS — Z515 Encounter for palliative care: Secondary | ICD-10-CM | POA: Diagnosis not present

## 2023-06-03 DIAGNOSIS — K644 Residual hemorrhoidal skin tags: Secondary | ICD-10-CM | POA: Diagnosis present

## 2023-06-03 DIAGNOSIS — T66XXXD Radiation sickness, unspecified, subsequent encounter: Secondary | ICD-10-CM | POA: Diagnosis not present

## 2023-06-03 DIAGNOSIS — K7200 Acute and subacute hepatic failure without coma: Secondary | ICD-10-CM | POA: Diagnosis present

## 2023-06-03 DIAGNOSIS — Z7984 Long term (current) use of oral hypoglycemic drugs: Secondary | ICD-10-CM | POA: Diagnosis not present

## 2023-06-03 DIAGNOSIS — Z79899 Other long term (current) drug therapy: Secondary | ICD-10-CM

## 2023-06-03 DIAGNOSIS — E119 Type 2 diabetes mellitus without complications: Secondary | ICD-10-CM | POA: Diagnosis not present

## 2023-06-03 DIAGNOSIS — Z66 Do not resuscitate: Secondary | ICD-10-CM | POA: Diagnosis present

## 2023-06-03 DIAGNOSIS — G9349 Other encephalopathy: Secondary | ICD-10-CM | POA: Diagnosis present

## 2023-06-03 DIAGNOSIS — Z8679 Personal history of other diseases of the circulatory system: Secondary | ICD-10-CM | POA: Diagnosis not present

## 2023-06-03 DIAGNOSIS — J9602 Acute respiratory failure with hypercapnia: Secondary | ICD-10-CM | POA: Diagnosis present

## 2023-06-03 DIAGNOSIS — Z794 Long term (current) use of insulin: Secondary | ICD-10-CM | POA: Diagnosis not present

## 2023-06-03 DIAGNOSIS — I10 Essential (primary) hypertension: Secondary | ICD-10-CM | POA: Diagnosis not present

## 2023-06-03 DIAGNOSIS — G893 Neoplasm related pain (acute) (chronic): Secondary | ICD-10-CM | POA: Diagnosis present

## 2023-06-03 DIAGNOSIS — F419 Anxiety disorder, unspecified: Secondary | ICD-10-CM | POA: Diagnosis present

## 2023-06-03 DIAGNOSIS — R609 Edema, unspecified: Secondary | ICD-10-CM | POA: Diagnosis not present

## 2023-06-03 DIAGNOSIS — G939 Disorder of brain, unspecified: Secondary | ICD-10-CM | POA: Diagnosis not present

## 2023-06-03 DIAGNOSIS — E872 Acidosis, unspecified: Secondary | ICD-10-CM | POA: Diagnosis present

## 2023-06-03 DIAGNOSIS — I5021 Acute systolic (congestive) heart failure: Secondary | ICD-10-CM | POA: Diagnosis not present

## 2023-06-03 DIAGNOSIS — I480 Paroxysmal atrial fibrillation: Secondary | ICD-10-CM | POA: Diagnosis not present

## 2023-06-03 DIAGNOSIS — L89316 Pressure-induced deep tissue damage of right buttock: Secondary | ICD-10-CM | POA: Diagnosis present

## 2023-06-03 LAB — CBC WITH DIFFERENTIAL/PLATELET
Abs Immature Granulocytes: 1.1 10*3/uL — ABNORMAL HIGH (ref 0.00–0.07)
Band Neutrophils: 2 %
Basophils Absolute: 0 10*3/uL (ref 0.0–0.1)
Basophils Relative: 0 %
Eosinophils Absolute: 0 10*3/uL (ref 0.0–0.5)
Eosinophils Relative: 0 %
HCT: 31.7 % — ABNORMAL LOW (ref 36.0–46.0)
Hemoglobin: 10.2 g/dL — ABNORMAL LOW (ref 12.0–15.0)
Lymphocytes Relative: 18 %
Lymphs Abs: 2.7 10*3/uL (ref 0.7–4.0)
MCH: 32.4 pg (ref 26.0–34.0)
MCHC: 32.2 g/dL (ref 30.0–36.0)
MCV: 100.6 fL — ABNORMAL HIGH (ref 80.0–100.0)
Metamyelocytes Relative: 4 %
Monocytes Absolute: 0.9 10*3/uL (ref 0.1–1.0)
Monocytes Relative: 6 %
Myelocytes: 3 %
Neutro Abs: 10.4 10*3/uL — ABNORMAL HIGH (ref 1.7–7.7)
Neutrophils Relative %: 67 %
Platelets: 144 10*3/uL — ABNORMAL LOW (ref 150–400)
RBC: 3.15 MIL/uL — ABNORMAL LOW (ref 3.87–5.11)
RDW: 15.6 % — ABNORMAL HIGH (ref 11.5–15.5)
WBC: 15.1 10*3/uL — ABNORMAL HIGH (ref 4.0–10.5)
nRBC: 0.2 % (ref 0.0–0.2)

## 2023-06-03 LAB — I-STAT ARTERIAL BLOOD GAS, ED
Acid-base deficit: 3 mmol/L — ABNORMAL HIGH (ref 0.0–2.0)
Bicarbonate: 25.9 mmol/L (ref 20.0–28.0)
Calcium, Ion: 1.07 mmol/L — ABNORMAL LOW (ref 1.15–1.40)
HCT: 32 % — ABNORMAL LOW (ref 36.0–46.0)
Hemoglobin: 10.9 g/dL — ABNORMAL LOW (ref 12.0–15.0)
O2 Saturation: 98 %
Patient temperature: 97.7
Potassium: 3.4 mmol/L — ABNORMAL LOW (ref 3.5–5.1)
Sodium: 134 mmol/L — ABNORMAL LOW (ref 135–145)
TCO2: 28 mmol/L (ref 22–32)
pCO2 arterial: 63 mmHg — ABNORMAL HIGH (ref 32–48)
pH, Arterial: 7.219 — ABNORMAL LOW (ref 7.35–7.45)
pO2, Arterial: 137 mmHg — ABNORMAL HIGH (ref 83–108)

## 2023-06-03 LAB — COMPREHENSIVE METABOLIC PANEL
ALT: 237 U/L — ABNORMAL HIGH (ref 0–44)
AST: 252 U/L — ABNORMAL HIGH (ref 15–41)
Albumin: 2 g/dL — ABNORMAL LOW (ref 3.5–5.0)
Alkaline Phosphatase: 99 U/L (ref 38–126)
Anion gap: 12 (ref 5–15)
BUN: 30 mg/dL — ABNORMAL HIGH (ref 8–23)
CO2: 21 mmol/L — ABNORMAL LOW (ref 22–32)
Calcium: 6.7 mg/dL — ABNORMAL LOW (ref 8.9–10.3)
Chloride: 104 mmol/L (ref 98–111)
Creatinine, Ser: 1.04 mg/dL — ABNORMAL HIGH (ref 0.44–1.00)
GFR, Estimated: 60 mL/min — ABNORMAL LOW (ref >60)
Glucose, Bld: 288 mg/dL — ABNORMAL HIGH (ref 70–99)
Potassium: 3.4 mmol/L — ABNORMAL LOW (ref 3.5–5.1)
Sodium: 137 mmol/L (ref 135–145)
Total Bilirubin: 0.5 mg/dL (ref 0.3–1.2)
Total Protein: 4.2 g/dL — ABNORMAL LOW (ref 6.5–8.1)

## 2023-06-03 LAB — TROPONIN I (HIGH SENSITIVITY)
Troponin I (High Sensitivity): 157 ng/L (ref <18)
Troponin I (High Sensitivity): 1615 ng/L (ref <18)

## 2023-06-03 LAB — I-STAT CG4 LACTIC ACID, ED: Lactic Acid, Venous: 5 mmol/L (ref 0.5–1.9)

## 2023-06-03 LAB — HEMOGLOBIN A1C
Hgb A1c MFr Bld: 7.3 % — ABNORMAL HIGH (ref 4.8–5.6)
Mean Plasma Glucose: 162.81 mg/dL

## 2023-06-03 LAB — CBG MONITORING, ED: Glucose-Capillary: 349 mg/dL — ABNORMAL HIGH (ref 70–99)

## 2023-06-03 LAB — MRSA NEXT GEN BY PCR, NASAL: MRSA by PCR Next Gen: NOT DETECTED

## 2023-06-03 LAB — HIV ANTIBODY (ROUTINE TESTING W REFLEX): HIV Screen 4th Generation wRfx: NONREACTIVE

## 2023-06-03 MED ORDER — AMIODARONE HCL IN DEXTROSE 360-4.14 MG/200ML-% IV SOLN
60.0000 mg/h | INTRAVENOUS | Status: DC
  Administered 2023-06-03 (×2): 60 mg/h via INTRAVENOUS
  Filled 2023-06-03: qty 200

## 2023-06-03 MED ORDER — ORAL CARE MOUTH RINSE: 15.0000 mL | OROMUCOSAL | Status: DC | PRN

## 2023-06-03 MED ORDER — ONDANSETRON HCL 4 MG/2ML IJ SOLN
4.0000 mg | Freq: Four times a day (QID) | INTRAMUSCULAR | Status: DC | PRN
  Administered 2023-06-06: 4 mg via INTRAVENOUS
  Filled 2023-06-03: qty 2

## 2023-06-03 MED ORDER — LEVETIRACETAM IN NACL 1000 MG/100ML IV SOLN
1000.0000 mg | Freq: Two times a day (BID) | INTRAVENOUS | Status: DC
  Administered 2023-06-03 – 2023-06-06 (×6): 1000 mg via INTRAVENOUS
  Filled 2023-06-03 (×7): qty 100

## 2023-06-03 MED ORDER — DEXAMETHASONE SODIUM PHOSPHATE 4 MG/ML IJ SOLN
3.0000 mg | Freq: Every day | INTRAMUSCULAR | Status: DC
  Administered 2023-06-03 – 2023-06-06 (×4): 3 mg via INTRAVENOUS
  Filled 2023-06-03 (×3): qty 0.75
  Filled 2023-06-03: qty 1

## 2023-06-03 MED ORDER — INSULIN GLARGINE-YFGN 100 UNIT/ML ~~LOC~~ SOLN
10.0000 [IU] | Freq: Every day | SUBCUTANEOUS | Status: DC
  Administered 2023-06-03: 10 [IU] via SUBCUTANEOUS
  Filled 2023-06-03 (×2): qty 0.1

## 2023-06-03 MED ORDER — INSULIN ASPART 100 UNIT/ML IJ SOLN: 0.0000 [IU] | INTRAMUSCULAR | Status: DC

## 2023-06-03 MED ORDER — ETOMIDATE 2 MG/ML IV SOLN
INTRAVENOUS | Status: DC | PRN
Start: 2023-06-03 — End: 2023-06-03
  Administered 2023-06-03: 20 mg via INTRAVENOUS

## 2023-06-03 MED ORDER — AMIODARONE HCL IN DEXTROSE 360-4.14 MG/200ML-% IV SOLN
30.0000 mg/h | INTRAVENOUS | Status: DC
  Administered 2023-06-04 – 2023-06-07 (×6): 30 mg/h via INTRAVENOUS
  Filled 2023-06-03 (×7): qty 200

## 2023-06-03 MED ORDER — DOCUSATE SODIUM 100 MG PO CAPS: 100.0000 mg | ORAL_CAPSULE | Freq: Two times a day (BID) | ORAL | Status: DC | PRN

## 2023-06-03 MED ORDER — DOCUSATE SODIUM 50 MG/5ML PO LIQD
100.0000 mg | Freq: Two times a day (BID) | ORAL | Status: DC
  Administered 2023-06-03 – 2023-06-04 (×3): 100 mg
  Filled 2023-06-03 (×5): qty 10

## 2023-06-03 MED ORDER — SODIUM CHLORIDE 0.9 % IV SOLN
3.0000 g | Freq: Four times a day (QID) | INTRAVENOUS | Status: DC
  Administered 2023-06-03 – 2023-06-05 (×7): 3 g via INTRAVENOUS
  Filled 2023-06-03 (×8): qty 8

## 2023-06-03 MED ORDER — ROCURONIUM BROMIDE 50 MG/5ML IV SOLN
INTRAVENOUS | Status: DC | PRN
Start: 2023-06-03 — End: 2023-06-03
  Administered 2023-06-03: 100 mg via INTRAVENOUS

## 2023-06-03 MED ORDER — MIDAZOLAM HCL 2 MG/2ML IJ SOLN: 1.0000 mg | INTRAMUSCULAR | Status: DC | PRN

## 2023-06-03 MED ORDER — FAMOTIDINE 20 MG PO TABS
20.0000 mg | ORAL_TABLET | Freq: Two times a day (BID) | ORAL | Status: DC
  Administered 2023-06-03 – 2023-06-04 (×3): 20 mg
  Filled 2023-06-03 (×4): qty 1

## 2023-06-03 MED ORDER — INSULIN ASPART 100 UNIT/ML IJ SOLN
0.0000 [IU] | INTRAMUSCULAR | Status: DC
  Administered 2023-06-03: 15 [IU] via SUBCUTANEOUS

## 2023-06-03 MED ORDER — INSULIN ASPART 100 UNIT/ML IJ SOLN
0.0000 [IU] | INTRAMUSCULAR | Status: DC
  Administered 2023-06-03: 11 [IU] via SUBCUTANEOUS
  Administered 2023-06-03: 15 [IU] via SUBCUTANEOUS

## 2023-06-03 MED ORDER — POLYETHYLENE GLYCOL 3350 17 G PO PACK: 17.0000 g | PACK | Freq: Every day | ORAL | Status: DC | PRN

## 2023-06-03 MED ORDER — FENTANYL CITRATE PF 50 MCG/ML IJ SOSY
25.0000 ug | PREFILLED_SYRINGE | INTRAMUSCULAR | Status: DC | PRN
  Administered 2023-06-04: 50 ug via INTRAVENOUS
  Filled 2023-06-03 (×2): qty 1

## 2023-06-03 MED ORDER — SODIUM CHLORIDE 0.9 % IV BOLUS
1000.0000 mL | Freq: Once | INTRAVENOUS | Status: AC
  Administered 2023-06-03: 1000 mL via INTRAVENOUS

## 2023-06-03 MED ORDER — FENTANYL CITRATE PF 50 MCG/ML IJ SOSY
25.0000 ug | PREFILLED_SYRINGE | INTRAMUSCULAR | Status: DC | PRN
  Administered 2023-06-03: 25 ug via INTRAVENOUS

## 2023-06-03 MED ORDER — POLYETHYLENE GLYCOL 3350 17 G PO PACK
17.0000 g | PACK | Freq: Every day | ORAL | Status: DC
  Administered 2023-06-03 – 2023-06-04 (×2): 17 g
  Filled 2023-06-03 (×3): qty 1

## 2023-06-03 MED ORDER — ORAL CARE MOUTH RINSE
15.0000 mL | OROMUCOSAL | Status: DC
  Administered 2023-06-03 – 2023-06-05 (×24): 15 mL via OROMUCOSAL

## 2023-06-03 MED ORDER — AMIODARONE LOAD VIA INFUSION
150.0000 mg | Freq: Once | INTRAVENOUS | Status: AC
  Administered 2023-06-03: 150 mg via INTRAVENOUS
  Filled 2023-06-03: qty 83.34

## 2023-06-03 MED ORDER — INSULIN ASPART 100 UNIT/ML IJ SOLN: 15.0000 [IU] | Freq: Once | INTRAMUSCULAR | Status: DC

## 2023-06-03 NOTE — H&P (Signed)
NAME:  Virginia [name], MRN:  [id number], DOB:  11-11-1955, LOS: 0 ADMISSION DATE:  06/03/2023, CONSULTATION DATE:  06/03/2023 REFERRING MD:  Dr. Jeraldine Loots - EDP, CHIEF COMPLAINT:  [facility] cardiac arrest    History of Present Illness:  Virginia [name] is a 67 y.o. with past medical history significant for metastatic lung cancer, prior DVT, HTN, HLD, tobacco abuse, DVT, and anxiety who presented to the ED 9/23 after suffering a witnessed out-of-hospital cardiac arrest.  EP patient received 5 shocks with EMS prior to arrival with 2 rounds of epi.  On ED arrival patient was seen hemodynamically stable but emergently intubated for airway protection.  Vast majority of medical workup pending at time of assessment including CBC, CMP, and imaging.  PCCM consulted for further management admission.  Pertinent  Medical History  Metastatic lung cancer, prior DVT, HTN, HLD, tobacco abuse, DVT, and anxiety  Significant Hospital Events: Including procedures, antibiotic start and stop dates in addition to other pertinent events     Interim History / Subjective:  As above   Objective   Blood pressure 105/76, pulse 87, temperature (!) 95.3 F (35.2 C), temperature source Temporal, resp. rate 18, SpO2 100%.    Vent Mode: PRVC FiO2 (%):  [100 %] 100 % Set Rate:  [18 bmp] 18 bmp Vt Set:  [400 mL] 400 mL PEEP:  [5 cmH20] 5 cmH20 Plateau Pressure:  [19 cmH20] 19 cmH20  No intake or output data in the 24 hours ending 06/03/23 1518 There were no vitals filed for this visit.  Examination: General:  acute on chronic ill-appearing middle-aged female lying in bed on mechanical ventilation in no acute distress HEENT: ETT, MM pink/moist, PERRL,  Neuro: Unresponsive on vent CV: s1s2 regular rate and rhythm, no murmur, rubs, or gallops,  PULM: Clear to auscultation bilaterally, no increased work of breathing, no added breath sounds GI: soft, bowel sounds active in all 4 quadrants, non-tender,  non-distended Extremities: warm/dry, no edema  Skin: no rashes or lesions  Resolved Hospital Problem list     Assessment & Plan:  Out-of-hospital cardiac arrest -Estimated downtime greater than 20 minutes, received bystander CPR P: Continuous telemetry Consider cardiology consult Check echocardiogram Optimize electrolytes Goal of normothermia  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 Head of bed elevated 30 degrees. Plateau pressures less than 30 cm H20.  Follow intermittent chest x-ray and ABG.   SAT/SBT as tolerated, mentation preclude extubation  Ensure adequate pulmonary hygiene  Follow cultures  VAP bundle in place  PAD protocol Empiric Unasyn   Hyponatremia P: Supplement as needed Trend CMP  Shock liver  -AST and ALT elevated on admission postarrest P: Trend LFTs Avoid hepatoxins   Hyperglycemia  P: SSI  CBG goal 140-180 CBG checks q4  Goals of care Discussed with patient's husband and daughter goals of care given cardiac arrest in the setting of metastatic cancer.  They indicate patient has standing DNR but at this time wished to proceed with FULL  code until neurological outcome can be identified.  Best Practice (right click and "Reselect all SmartList Selections" daily)   Diet/type: NPO DVT prophylaxis: SCD GI prophylaxis: PPI Lines: N/A Foley:  Yes, and it is still needed Code  Status:  full code Last date of multidisciplinary goals of care discussion: See GOC discussion above   Labs   CBC: No results for input(s): "WBC", "NEUTROABS", "HGB", "HCT", "MCV", "PLT" in the last 168 hours.  Basic Metabolic Panel: No results for input(s): "NA", "K", "CL", "CO2", "GLUCOSE", "BUN", "CREATININE", "CALCIUM", "MG", "PHOS" in the last 168 hours. GFR: Estimated Creatinine Clearance: 33.5 mL/min (A) (by C-G formula based on SCr of 1.47 mg/dL (H)). Recent Labs  Lab 06/03/23 1510  LATICACIDVEN 5.0*    Liver Function Tests: No results for input(s): "AST", "ALT", "ALKPHOS", "BILITOT", "PROT", "ALBUMIN" in the last 168 hours. No results for input(s): "LIPASE", "AMYLASE" in the last 168 hours. No results for input(s): "AMMONIA" in the last 168 hours.  ABG No results found for: "PHART", "PCO2ART", "PO2ART", "HCO3", "TCO2", "ACIDBASEDEF", "O2SAT"   Coagulation Profile: No results for input(s): "INR", "PROTIME" in the last 168 hours.  Cardiac Enzymes: No results for input(s): "CKTOTAL", "CKMB", "CKMBINDEX", "TROPONINI" in the last 168 hours.  HbA1C: Hgb A1c MFr Bld  Date/Time Value Ref Range Status  01/12/2019 10:21 AM 6.0 (H) <5.7 % of total Hgb Final    Comment:    For someone without known diabetes, a hemoglobin  A1c value between 5.7% and 6.4% is consistent with prediabetes and should be confirmed with a  follow-up test. . For someone with known diabetes, a value <7% indicates that their diabetes is well controlled. A1c targets should be individualized based on duration of diabetes, age, comorbid conditions, and other considerations. . This assay result is consistent with an increased risk of diabetes. . Currently, no consensus exists regarding use of hemoglobin A1c for diagnosis of diabetes for children. Marland Kitchen   10/07/2018 10:07 AM 6.0 (H) <5.7 % of total Hgb Final    Comment:    For someone without known diabetes, a hemoglobin  A1c value between 5.7% and  6.4% is consistent with prediabetes and should be confirmed with a  follow-up test. . For someone with known diabetes, a value <7% indicates that their diabetes is well controlled. A1c targets should be individualized based on duration of diabetes, age, comorbid conditions, and other considerations. . This assay result is consistent with an increased risk of diabetes. . Currently, no consensus exists regarding use of hemoglobin A1c for diagnosis of diabetes for children. .     CBG: No results for input(s): "GLUCAP" in the last 168 hours.  Review of Systems:   Unable to assess   Past Medical History:  She,  has a past medical history of Anemia, Anxiety, Concussion (09/28/2019), DVT (deep venous thrombosis) (HCC) (2021), Dyspnea, GERD (gastroesophageal reflux disease), Hypercholesterolemia, Hypertension, met lung ca (dx'd 09/2019), PONV (postoperative nausea and vomiting), and Tobacco abuse.   Surgical History:   Past Surgical History:  Procedure Laterality Date   ABDOMINAL HYSTERECTOMY     partial/ left ovaries   APPLICATION OF CRANIAL NAVIGATION N/A 12/02/2020   Procedure: APPLICATION OF CRANIAL NAVIGATION;  Surgeon: Maeola Harman, MD;  Location: [facility] OR;  Service: Neurosurgery;  Laterality: N/A;   CHOLECYSTECTOMY     CRANIOTOMY N/A 12/02/2020   Procedure: Posterior fossa craniotomy for tumor resection with brainlab;  Surgeon: Maeola Harman, MD;  Location: [facility] OR;  Service: Neurosurgery;  Laterality: N/A;  DILATION AND CURETTAGE OF UTERUS     IR IMAGING GUIDED PORT INSERTION  10/23/2019   IR PATIENT EVAL TECH 0-60 MINS  11/21/2021   IR PATIENT EVAL TECH 0-60 MINS  11/24/2021   IR PATIENT EVAL TECH 0-60 MINS  11/29/2021   IR PATIENT EVAL TECH 0-60 MINS  12/04/2021   IR PATIENT EVAL TECH 0-60 MINS  12/12/2021   IR PATIENT EVAL TECH 0-60 MINS  12/19/2021   IR PATIENT EVAL TECH 0-60 MINS  12/27/2021   IR PATIENT EVAL TECH 0-60 MINS  01/03/2022   IR PATIENT EVAL TECH 0-60 MINS  01/10/2022    IR PATIENT EVAL TECH 0-60 MINS  01/18/2022   IR PATIENT EVAL TECH 0-60 MINS  02/13/2022   IR PATIENT EVAL TECH 0-60 MINS  02/20/2022   IR RADIOLOGIST EVAL & MGMT  01/23/2022   IR REMOVAL TUN ACCESS W/ PORT W/O FL MOD SED  11/17/2021   KYPHOPLASTY N/A 03/15/2020   Procedure: Thoracic Eight KYPHOPLASTY;  Surgeon: Maeola Harman, MD;  Location: [facility] OR;  Service: Neurosurgery;  Laterality: N/A;  prone    LIPOMA EXCISION  2018   removed under left breast and right thigh.   TUBAL LIGATION       Social History:   reports that she quit smoking about 10 years ago. Her smoking use included cigarettes. She has never used smokeless tobacco. She reports current alcohol use. She reports that she does not use drugs.   Family History:  Her family history includes Cancer in her sister; Drug abuse in her daughter and son; Heart attack in her brother; Heart disease in her brother and father.   Allergies No Known Allergies   Home Medications  Prior to Admission medications   Medication Sig Start Date End Date Taking? Authorizing Provider  acetaminophen (TYLENOL) 500 MG tablet Take 500 mg by mouth 2 (two) times daily as needed for moderate pain.    [provider]  albuterol (VENTOLIN HFA) 108 (90 Base) MCG/ACT inhaler INHALE 2 PUFFS INTO THE LUNGS EVERY 6 HOURS AS NEEDED FOR WHEEZING OR SHORTNESS OF BREATH 11/23/22   [name], MD  ALPRAZolam (XANAX) 0.25 MG tablet TAKE 1 TABLET(0.25 MG) BY MOUTH TWICE DAILY AS NEEDED FOR ANXIETY 03/20/23   Jaci Standard, MD  amLODipine (NORVASC) 10 MG tablet TAKE 1 TABLET BY MOUTH EVERY DAY 04/15/23   Jaci Standard, MD  Cyanocobalamin (VITAMIN B12 PO) Take 1 tablet by mouth daily. Gummies    [provider]  dexamethasone (DECADRON) 1 MG tablet Take 3 tablets by mouth daily with breakfast for 7 days, THEN 2 tablets daily with breakfast for 7 days, THEN 1 tablet daily with breakfast for 7 days. 05/30/23 06/20/23  Henreitta Leber, MD  EPINEPHrine 0.3  mg/0.3 mL IJ SOAJ injection Inject 0.3 mg into the muscle as needed for anaphylaxis. 11/17/21   Darlin Drop, DO  folic acid (FOLVITE) 1 MG tablet TAKE 1 TABLET BY MOUTH DAILY 10/18/22 10/18/23  Jaci Standard, MD  furosemide (LASIX) 20 MG tablet Take 2 tablets (40 mg total) by mouth 2 times daily. 05/17/23   Jaci Standard, MD  Glucosamine HCl (GLUCOSAMINE PO) Take 2 tablets by mouth daily.    [provider]  hydrocortisone (ANUSOL-HC) 2.5 % rectal cream use in rectal area 2 to 3 times a day as needed 05/24/23   [name], MD  LYSINE PO Take 2 tablets by mouth daily.  [provider]  meclizine (ANTIVERT) 25 MG tablet Take 1 tablet (25 mg total) by mouth 3 (three) times daily as needed for dizziness. Patient taking differently: Take 25 mg by mouth 3 (three) times daily as needed for dizziness (2-3 tablets prn). 05/31/22   Henreitta Leber, MD  Multiple Vitamin (MULTIVITAMIN WITH MINERALS) TABS tablet Take 1 tablet by mouth daily.    [provider]  OLANZapine (ZYPREXA) 5 MG tablet Take 1 tablet (5 mg total) by mouth at bedtime. 05/10/23   Jonah Blue, MD  OLANZapine zydis (ZYPREXA) 5 MG disintegrating tablet Take 0.5 tablets (2.5 mg total) by mouth daily. 05/11/23   Jonah Blue, MD  ondansetron (ZOFRAN) 4 MG tablet TAKE 1 TABLET(4 MG) BY MOUTH EVERY 8 HOURS AS NEEDED FOR NAUSEA OR VOMITING 01/30/23   Jaci Standard, MD  OVER THE COUNTER MEDICATION Take 2 tablets by mouth daily. Beet root    [provider]  oxyCODONE (OXY IR/ROXICODONE) 5 MG immediate release tablet Take 1 tablet (5 mg total) by mouth every 4 (four) hours as needed for severe pain. 04/04/23   Jaci Standard, MD  pantoprazole (PROTONIX) 40 MG tablet Take 1 tablet (40 mg total) by mouth daily. 09/05/22   [name], MD  polyethylene glycol (MIRALAX / GLYCOLAX) 17 g packet Take 17 g by mouth every evening.    [provider]  potassium chloride SA (KLOR-CON M) 20 MEQ  tablet Take 2 tablets (40 mEq total) by mouth 2 (two) times daily. 03/08/23   Jaci Standard, MD  protein supplement (RESOURCE BENEPROTEIN) 6 g POWD Take 1 Scoop (6 g total) by mouth 3 (three) times daily with meals. 05/10/23 06/09/23  Jonah Blue, MD  Tetrahydrozoline HCl (VISINE OP) Place 1 drop into both eyes daily as needed (irritation).    [provider]  VITAMIN D PO Take 1 capsule by mouth every evening.    [provider]  vitamin E 180 MG (400 UNITS) capsule Take 400 IU daily x 1 week, then 400 IU BID Patient taking differently: Take 400 Units by mouth daily. 08/22/20   Lonie Peak, MD  XTAMPZA ER 13.5 MG C12A Take 1 capsule by mouth in the morning and at bedtime. (every 12 hours) 05/17/23 11/13/23  Jaci Standard, MD     Critical care time:   CRITICAL CARE Performed by: Vicy Medico D. Harris   Total critical care time: 50 minutes  Critical care time was exclusive of separately billable procedures and treating other patients.  Critical care was necessary to treat or prevent imminent or life-threatening deterioration.  Critical care was time spent personally by me on the following activities: development of treatment plan with patient and/or surrogate as well as nursing, discussions with consultants, evaluation of patient's response to treatment, examination of patient, obtaining history from patient or surrogate, ordering and performing treatments and interventions, ordering and review of laboratory studies, ordering and review of radiographic studies, pulse oximetry and re-evaluation of patient's condition.  Dakiyah Heinke D. Harris, NP-C [facility] Personal contact information can be found on Amion  If no contact or response made please call [phone] 06/03/2023, 4:51 PM

## 2023-06-03 NOTE — Progress Notes (Signed)
Pharmacy Antibiotic Note  Virginia [name] is a 67 y.o. female admitted on 06/03/2023 with out of hospital cardiac arrest with concerns for  aspiration pneumonia .  Pharmacy has been consulted for Unasyn dosing.  Plan: Unasyn 3g IV Q6h IV Trend WBC, fever, renal function F/u cultures, clinical progress, levels as indicated De-escalate when able   Height: 5\' 2"  (157.5 cm) Weight: 63.8 kg (140 lb 10.5 oz) IBW/kg (Calculated) : 50.1  Temp (24hrs), Avg:97.1 F (36.2 C), Min:95.3 F (35.2 C), Max:97.7 F (36.5 C)  Recent Labs  Lab 06/03/23 1453 06/03/23 1510  WBC 15.1*  --   CREATININE 1.04*  --   LATICACIDVEN  --  5.0*    Estimated Creatinine Clearance: 47.3 mL/min (A) (by C-G formula based on SCr of 1.04 mg/dL (H)).    No Known Allergies  Thank you for allowing pharmacy to be a part of this patient's care.  Carrington Clamp 06/03/2023 5:30 PM

## 2023-06-03 NOTE — ED Triage Notes (Signed)
Pt arrives to ED via EMS from private residence. EMS states pt was eating oatmeal with husband and went unresponsive. EMS states husband initiated CPR immediately. EMS reports monitor read Vfib, administered 2x5 mcg epinephrine, 2x100 mg lidocaine, 5x shocks. reports ROSC @ approximately 1410 EMS bagging pt upon arrival to ED, EMS reports pt with some spontaneous respirations.  Pt with hx stage 4 lung cancer.

## 2023-06-03 NOTE — Progress Notes (Signed)
eLink Physician-Brief Progress Note Patient Name: Virginia [name] DOB: December 17, 1955 MRN: [id number]   Date of Service  06/03/2023  HPI/Events of Note  67 year old woman who suffered a + cardiac arrest. Presenting rhythm was apparently VF, but family feels that the arrest may have been brought on by a seizure.   Persistent hyperglycemia with latest CBG of 471.  A1c 7.3 on dexamethasone.  eICU Interventions  15 units aspart once to cover this.  Increase sliding scale to resistant.  Glargine 10 units.   7253 -rising times despite external cooling with ice packs.  Elevated LFT's.  Will add acetaminophen not to exceed 3000 MGs per day.  Intervention Category Intermediate Interventions: Hyperglycemia - evaluation and treatment  [name] 06/03/2023, 9:08 PM

## 2023-06-03 NOTE — Progress Notes (Signed)
RT NOTE: RT transported patient on ventilator from ED to room 2H26 with no complications. RT on unit given report. Vitals are stable.

## 2023-06-03 NOTE — Progress Notes (Signed)
RT assisted with patient transport to CT and returned to [facility] without complications.

## 2023-06-03 NOTE — Telephone Encounter (Signed)
Received orders for OT EVAL for assessment of ADLS need and potential for improvement. Faxed back to [phone]

## 2023-06-03 NOTE — Progress Notes (Signed)
Per RN pt is going to 2H. Tech will set up and try to get pt then

## 2023-06-03 NOTE — ED Provider Notes (Signed)
[place] EMERGENCY DEPARTMENT AT [facility] Provider Note   CSN: [id number] Arrival date & time: 06/03/23  1443     History {Add pertinent medical, surgical, social history, OB history to HPI:1} No chief complaint on file.   Virginia [name] is a 67 y.o. female.  HPI Adult female with multiple medical issues most notably metastatic lung cancer presents with concern for cardiac arrest. History is obtained by EMS and subsequently by the patient's husband.  Apparently the patient had an episode of coughing, and after briefly stating that she was okay she was then unresponsive.  He initiated CPR, and EMS reports that the patient received a total of 20, 25 minutes of CPR.  En route the patient received defibrillator x 5, epinephrine at least x 2, had return of spontaneous circulation.  On arrival she is unresponsive, level 5 caveat. Husband, daughter note the patient has been doing generally well.  They do note a history of ongoing cancer.    Home Medications Prior to Admission medications   Medication Sig Start Date End Date Taking? Authorizing Provider  acetaminophen (TYLENOL) 500 MG tablet Take 500 mg by mouth 2 (two) times daily as needed for moderate pain.    [provider]  albuterol (VENTOLIN HFA) 108 (90 Base) MCG/ACT inhaler INHALE 2 PUFFS INTO THE LUNGS EVERY 6 HOURS AS NEEDED FOR WHEEZING OR SHORTNESS OF BREATH 11/23/22   [name], MD  ALPRAZolam (XANAX) 0.25 MG tablet TAKE 1 TABLET(0.25 MG) BY MOUTH TWICE DAILY AS NEEDED FOR ANXIETY 03/20/23   Jaci Standard, MD  amLODipine (NORVASC) 10 MG tablet TAKE 1 TABLET BY MOUTH EVERY DAY 04/15/23   Jaci Standard, MD  Cyanocobalamin (VITAMIN B12 PO) Take 1 tablet by mouth daily. Gummies    [provider]  dexamethasone (DECADRON) 1 MG tablet Take 3 tablets by mouth daily with breakfast for 7 days, THEN 2 tablets daily with breakfast for 7 days, THEN 1 tablet daily with breakfast for 7 days. 05/30/23  06/20/23  Henreitta Leber, MD  EPINEPHrine 0.3 mg/0.3 mL IJ SOAJ injection Inject 0.3 mg into the muscle as needed for anaphylaxis. 11/17/21   Darlin Drop, DO  folic acid (FOLVITE) 1 MG tablet TAKE 1 TABLET BY MOUTH DAILY 10/18/22 10/18/23  Jaci Standard, MD  furosemide (LASIX) 20 MG tablet Take 2 tablets (40 mg total) by mouth 2 times daily. 05/17/23   Jaci Standard, MD  Glucosamine HCl (GLUCOSAMINE PO) Take 2 tablets by mouth daily.    [provider]  hydrocortisone (ANUSOL-HC) 2.5 % rectal cream use in rectal area 2 to 3 times a day as needed 05/24/23   [name], MD  LYSINE PO Take 2 tablets by mouth daily.    [provider]  meclizine (ANTIVERT) 25 MG tablet Take 1 tablet (25 mg total) by mouth 3 (three) times daily as needed for dizziness. Patient taking differently: Take 25 mg by mouth 3 (three) times daily as needed for dizziness (2-3 tablets prn). 05/31/22   Henreitta Leber, MD  Multiple Vitamin (MULTIVITAMIN WITH MINERALS) TABS tablet Take 1 tablet by mouth daily.    [provider]  OLANZapine (ZYPREXA) 5 MG tablet Take 1 tablet (5 mg total) by mouth at bedtime. 05/10/23   Jonah Blue, MD  OLANZapine zydis (ZYPREXA) 5 MG disintegrating tablet Take 0.5 tablets (2.5 mg total) by mouth daily. 05/11/23   Jonah Blue, MD  ondansetron [facility])  4 MG tablet TAKE 1 TABLET(4 MG) BY MOUTH EVERY 8 HOURS AS NEEDED FOR NAUSEA OR VOMITING 01/30/23   Jaci Standard, MD  OVER THE COUNTER MEDICATION Take 2 tablets by mouth daily. Beet root    [provider]  oxyCODONE (OXY IR/ROXICODONE) 5 MG immediate release tablet Take 1 tablet (5 mg total) by mouth every 4 (four) hours as needed for severe pain. 04/04/23   Jaci Standard, MD  pantoprazole (PROTONIX) 40 MG tablet Take 1 tablet (40 mg total) by mouth daily. 09/05/22   [name], MD  polyethylene glycol (MIRALAX / GLYCOLAX) 17 g packet Take 17 g by mouth every evening.    [provider]  potassium chloride SA (KLOR-CON M) 20 MEQ tablet Take 2 tablets (40 mEq total) by mouth 2 (two) times daily. 03/08/23   Jaci Standard, MD  protein supplement (RESOURCE BENEPROTEIN) 6 g POWD Take 1 Scoop (6 g total) by mouth 3 (three) times daily with meals. 05/10/23 06/09/23  Jonah Blue, MD  Tetrahydrozoline HCl (VISINE OP) Place 1 drop into both eyes daily as needed (irritation).    [provider]  VITAMIN D PO Take 1 capsule by mouth every evening.    [provider]  vitamin E 180 MG (400 UNITS) capsule Take 400 IU daily x 1 week, then 400 IU BID Patient taking differently: Take 400 Units by mouth daily. 08/22/20   Lonie Peak, MD  XTAMPZA ER 13.5 MG C12A Take 1 capsule by mouth in the morning and at bedtime. (every 12 hours) 05/17/23 11/13/23  Jaci Standard, MD      Allergies    Patient has no known allergies.    Review of Systems   Review of Systems  Unable to perform ROS: Acuity of condition    Physical Exam Updated Vital Signs BP 105/76   Pulse 87   Temp (!) 95.3 F (35.2 C) (Temporal)   Resp 18   SpO2 100%  Physical Exam Vitals and nursing note reviewed.  Constitutional:      General: She is in acute distress.     Appearance: She is well-developed.  HENT:     Head: Normocephalic and atraumatic.  Eyes:     Conjunctiva/sclera: Conjunctivae normal.  Cardiovascular:     Rate and Rhythm: Normal rate and regular rhythm.  Pulmonary:     Effort: Pulmonary effort is normal. No respiratory distress.     Breath sounds: Normal breath sounds. No stridor.  Abdominal:     General: There is no distension.  Skin:    General: Skin is warm and dry.  Neurological:     Mental Status: She is alert and oriented to person, place, and time.     Cranial Nerves: No cranial nerve deficit.  Psychiatric:        Mood and Affect: Mood normal.     ED Results / Procedures / Treatments   Labs (all labs ordered are listed, but only abnormal results  are displayed) Labs Reviewed  COMPREHENSIVE METABOLIC PANEL  CBC WITH DIFFERENTIAL/PLATELET  BLOOD GAS, ARTERIAL  I-STAT CG4 LACTIC ACID, ED  TROPONIN I (HIGH SENSITIVITY)    EKG None  Radiology No results found.  Procedures Procedures  {Document cardiac monitor, telemetry assessment procedure when appropriate:1}  Medications Ordered in ED Medications  midazolam (VERSED) injection 1-2 mg (has no administration in time range)  fentaNYL (SUBLIMAZE) injection 25 mcg (has no administration in time range)  fentaNYL (  SUBLIMAZE) injection 25-100 mcg (has no administration in time range)  sodium chloride 0.9 % bolus 1,000 mL (1,000 mLs Intravenous New Bag/Given 06/03/23 1456)    ED Course/ Medical Decision Making/ A&P   {   Click here for ABCD2, HEART and other calculatorsREFRESH Note before signing :1}                              Medical Decision Making Adult female with metastatic cancer, hyperlipidemia, hypertension presents after cardiac arrest.  Concern for progression of disease versus ACS versus electrolyte abnormalities, or aspiration. Patient had resuscitation as documented in nursing notes, including fluids, intubation, continued monitoring.  Initial labs notable for lactic acidosis, pH 7.2.  Initial resuscitation discussed with family at length, as well as with our critical care team  Amount and/or Complexity of Data Reviewed Independent Historian: spouse and EMS External Data Reviewed: notes. Labs: ordered. Decision-making details documented in ED Course. Radiology: ordered and independent interpretation performed. Decision-making details documented in ED Course. ECG/medicine tests: ordered and independent interpretation performed. Decision-making details documented in ED Course.  Risk Prescription drug management. Decision regarding hospitalization.  On repeat exam patient in similar condition, now intubated, catheter in place, critical care aware.  CT head,  pending. ***  {Document critical care time when appropriate:1} {Document review of labs and clinical decision tools ie heart score, Chads2Vasc2 etc:1}  {Document your independent review of radiology images, and any outside records:1} {Document your discussion with family members, caretakers, and with consultants:1} {Document social determinants of health affecting pt's care:1} {Document your decision making why or why not admission, treatments were needed:1} Final Clinical Impression(s) / ED Diagnoses Final diagnoses:  None    Rx / DC Orders ED Discharge Orders     None

## 2023-06-03 NOTE — Procedures (Signed)
Patient Name: Virginia [name]  MRN: [id number]  Epilepsy Attending: Charlsie Quest  Referring Physician/Provider: Charlestine Night, RN  Date: 06/03/2023 Duration: 26.24 mins  Patient history: 67 year old woman who suffered a + cardiac arrest. Presenting rhythm was apparently VF, but family feels that the arrest may have been brought on by a seizure EEG to evaluate for seizure  Level of alertness: comatose  AEDs during EEG study: LEV  Technical aspects: This EEG study was done with scalp electrodes positioned according to the 10-20 International system of electrode placement. Electrical activity was reviewed with band pass filter of 1-70Hz , sensitivity of 7 uV/mm, display speed of 53mm/sec with a 60Hz  notched filter applied as appropriate. EEG data were recorded continuously and digitally stored.  Video monitoring was available and reviewed as appropriate.  Description: EEG showed continuous generalized low amplitude 2-3Hz  delta slowing. Hyperventilation and photic stimulation were not performed.     ABNORMALITY - Continuous slow, generalized  IMPRESSION: This study is suggestive of severe diffuse encephalopathy. No seizures or epileptiform discharges were seen throughout the recording.  Sybil Shrader Annabelle Harman

## 2023-06-03 NOTE — Telephone Encounter (Signed)
-  Fax Transmission Report-------  To:               Recipient at [phone] Subject:          Fw: Hp Scans Result:           The transmission was successful. Explanation:      All Pages Ok Pages Sent:       3 Connect Time:     1 minutes, 23 seconds Transmit Time:    06/03/2023 12:23 Transfer Rate:    14400 Status Code:      0000 Retry Count:      0 Job Id:           [id number] Unique Id:        [id number] Fax Line:         33 Fax Server:       [id number]

## 2023-06-03 NOTE — Progress Notes (Signed)
EEG complete - results pending 

## 2023-06-04 DIAGNOSIS — I469 Cardiac arrest, cause unspecified: Secondary | ICD-10-CM | POA: Diagnosis not present

## 2023-06-04 LAB — COMPREHENSIVE METABOLIC PANEL
ALT: 244 U/L — ABNORMAL HIGH (ref 0–44)
AST: 120 U/L — ABNORMAL HIGH (ref 15–41)
Albumin: 2.5 g/dL — ABNORMAL LOW (ref 3.5–5.0)
Alkaline Phosphatase: 101 U/L (ref 38–126)
Anion gap: 14 (ref 5–15)
BUN: 44 mg/dL — ABNORMAL HIGH (ref 8–23)
CO2: 23 mmol/L (ref 22–32)
Calcium: 7.5 mg/dL — ABNORMAL LOW (ref 8.9–10.3)
Chloride: 101 mmol/L (ref 98–111)
Creatinine, Ser: 1.33 mg/dL — ABNORMAL HIGH (ref 0.44–1.00)
GFR, Estimated: 44 mL/min — ABNORMAL LOW (ref >60)
Glucose, Bld: 85 mg/dL (ref 70–99)
Potassium: 3.3 mmol/L — ABNORMAL LOW (ref 3.5–5.1)
Sodium: 138 mmol/L (ref 135–145)
Total Bilirubin: 0.6 mg/dL (ref 0.3–1.2)
Total Protein: 5.6 g/dL — ABNORMAL LOW (ref 6.5–8.1)

## 2023-06-04 LAB — GLUCOSE, CAPILLARY
Glucose-Capillary: 471 mg/dL — ABNORMAL HIGH (ref 70–99)
Glucose-Capillary: 301 mg/dL — ABNORMAL HIGH (ref 70–99)
Glucose-Capillary: 46 mg/dL — ABNORMAL LOW (ref 70–99)
Glucose-Capillary: 215 mg/dL — ABNORMAL HIGH (ref 70–99)
Glucose-Capillary: 51 mg/dL — ABNORMAL LOW (ref 70–99)
Glucose-Capillary: 72 mg/dL (ref 70–99)
Glucose-Capillary: 82 mg/dL (ref 70–99)
Glucose-Capillary: 88 mg/dL (ref 70–99)
Glucose-Capillary: 58 mg/dL — ABNORMAL LOW (ref 70–99)
Glucose-Capillary: 158 mg/dL — ABNORMAL HIGH (ref 70–99)
Glucose-Capillary: 84 mg/dL (ref 70–99)
Glucose-Capillary: 128 mg/dL — ABNORMAL HIGH (ref 70–99)
Glucose-Capillary: 156 mg/dL — ABNORMAL HIGH (ref 70–99)
Glucose-Capillary: 174 mg/dL — ABNORMAL HIGH (ref 70–99)

## 2023-06-04 LAB — CBC
HCT: 31.2 % — ABNORMAL LOW (ref 36.0–46.0)
HCT: 32.8 % — ABNORMAL LOW (ref 36.0–46.0)
Hemoglobin: 10.9 g/dL — ABNORMAL LOW (ref 12.0–15.0)
Hemoglobin: 11.6 g/dL — ABNORMAL LOW (ref 12.0–15.0)
MCH: 33 pg (ref 26.0–34.0)
MCH: 33 pg (ref 26.0–34.0)
MCHC: 34.9 g/dL (ref 30.0–36.0)
MCHC: 35.4 g/dL (ref 30.0–36.0)
MCV: 94.5 fL (ref 80.0–100.0)
MCV: 93.2 fL (ref 80.0–100.0)
Platelets: 216 10*3/uL (ref 150–400)
Platelets: 208 10*3/uL (ref 150–400)
RBC: 3.3 MIL/uL — ABNORMAL LOW (ref 3.87–5.11)
RBC: 3.52 MIL/uL — ABNORMAL LOW (ref 3.87–5.11)
RDW: 14.8 % (ref 11.5–15.5)
RDW: 15.4 % (ref 11.5–15.5)
WBC: 10.8 10*3/uL — ABNORMAL HIGH (ref 4.0–10.5)
WBC: 12.4 10*3/uL — ABNORMAL HIGH (ref 4.0–10.5)
nRBC: 0 % (ref 0.0–0.2)
nRBC: 0.2 % (ref 0.0–0.2)

## 2023-06-04 LAB — CK: Total CK: 368 U/L — ABNORMAL HIGH (ref 38–234)

## 2023-06-04 LAB — PHOSPHORUS: Phosphorus: 5 mg/dL — ABNORMAL HIGH (ref 2.5–4.6)

## 2023-06-04 LAB — MAGNESIUM: Magnesium: 1.4 mg/dL — ABNORMAL LOW (ref 1.7–2.4)

## 2023-06-04 MED ORDER — VITAL AF 1.2 CAL PO LIQD: 1000.0000 mL | ORAL | Status: DC

## 2023-06-04 MED ORDER — DEXTROSE 50 % IV SOLN
12.5000 g | INTRAVENOUS | Status: AC
  Administered 2023-06-04: 12.5 g via INTRAVENOUS

## 2023-06-04 MED ORDER — DEXTROSE 50 % IV SOLN
25.0000 g | INTRAVENOUS | Status: AC
  Administered 2023-06-04: 25 g via INTRAVENOUS

## 2023-06-04 MED ORDER — POTASSIUM CHLORIDE 20 MEQ PO PACK
40.0000 meq | PACK | Freq: Once | ORAL | Status: AC
  Administered 2023-06-04: 40 meq
  Filled 2023-06-04: qty 2

## 2023-06-04 MED ORDER — FENTANYL CITRATE PF 50 MCG/ML IJ SOSY
50.0000 ug | PREFILLED_SYRINGE | INTRAMUSCULAR | Status: DC | PRN
  Administered 2023-06-04: 50 ug via INTRAVENOUS
  Filled 2023-06-04: qty 1

## 2023-06-04 MED ORDER — DEXTROSE 50 % IV SOLN
INTRAVENOUS | Status: AC
  Administered 2023-06-04: 25 g via INTRAVENOUS
  Filled 2023-06-04: qty 50

## 2023-06-04 MED ORDER — ACETAMINOPHEN 325 MG PO TABS: 650.0000 mg | ORAL_TABLET | Freq: Four times a day (QID) | ORAL | Status: DC | PRN

## 2023-06-04 MED ORDER — PROSOURCE TF20 ENFIT COMPATIBL EN LIQD
60.0000 mL | Freq: Every day | ENTERAL | Status: DC
  Administered 2023-06-04: 60 mL
  Filled 2023-06-04 (×2): qty 60

## 2023-06-04 MED ORDER — CHLORHEXIDINE GLUCONATE CLOTH 2 % EX PADS
6.0000 | MEDICATED_PAD | Freq: Every day | CUTANEOUS | Status: DC
  Administered 2023-06-04 – 2023-06-10 (×7): 6 via TOPICAL

## 2023-06-04 MED ORDER — VITAL AF 1.2 CAL PO LIQD
1000.0000 mL | ORAL | Status: DC
  Administered 2023-06-04: 1000 mL

## 2023-06-04 MED ORDER — MAGNESIUM SULFATE 4 GM/100ML IV SOLN
4.0000 g | Freq: Once | INTRAVENOUS | Status: AC
  Administered 2023-06-04: 4 g via INTRAVENOUS
  Filled 2023-06-04: qty 100

## 2023-06-04 MED ORDER — PROPOFOL 1000 MG/100ML IV EMUL
5.0000 ug/kg/min | INTRAVENOUS | Status: DC
  Administered 2023-06-04: 10 ug/kg/min via INTRAVENOUS
  Administered 2023-06-04: 5 ug/kg/min via INTRAVENOUS
  Filled 2023-06-04 (×2): qty 100

## 2023-06-04 MED ORDER — VITAL HIGH PROTEIN PO LIQD: 1000.0000 mL | ORAL | Status: DC

## 2023-06-04 MED ORDER — ENOXAPARIN SODIUM 40 MG/0.4ML IJ SOSY
40.0000 mg | PREFILLED_SYRINGE | INTRAMUSCULAR | Status: DC
  Administered 2023-06-04 – 2023-06-05 (×2): 40 mg via SUBCUTANEOUS
  Filled 2023-06-04 (×2): qty 0.4

## 2023-06-04 MED ORDER — FENTANYL CITRATE PF 50 MCG/ML IJ SOSY
50.0000 ug | PREFILLED_SYRINGE | INTRAMUSCULAR | Status: DC | PRN
  Administered 2023-06-04 – 2023-06-05 (×2): 50 ug via INTRAVENOUS
  Filled 2023-06-04 (×2): qty 1

## 2023-06-04 MED ORDER — DEXTROSE 50 % IV SOLN
INTRAVENOUS | Status: AC
  Filled 2023-06-04: qty 50

## 2023-06-04 MED ORDER — ACETAMINOPHEN 325 MG PO TABS
650.0000 mg | ORAL_TABLET | Freq: Four times a day (QID) | ORAL | Status: DC | PRN
  Administered 2023-06-04: 650 mg
  Filled 2023-06-04: qty 2

## 2023-06-04 NOTE — Significant Event (Signed)
Hypoglycemic Event  CBG: 46  Treatment: D50 50 mL (25 gm)  Symptoms: None  Follow-up CBG: Time:0452 CBG Result:215 Follow-up CBG: Time:0631 CBG Result:88  Possible Reasons for Event: medication regimen Inadequate meal intake  Comments/MD notified:Paliwal, MD     Cherlyn Roberts, RN

## 2023-06-04 NOTE — Progress Notes (Signed)
NAME:  TIKA BROADLEY, MRN:  [id number], DOB:  05/06/1956, LOS: 1 ADMISSION DATE:  06/03/2023, CONSULTATION DATE:  06/03/2023 REFERRING MD:  Dr. Jeraldine Loots - EDP, CHIEF COMPLAINT:  OOH cardiac arrest    History of Present Illness:  BRITTNI AMOAH is a 67 y.o. with past medical history significant for metastatic lung cancer, prior DVT, HTN, HLD, tobacco abuse, DVT, and anxiety who presented to the ED 9/23 after suffering a witnessed out-of-hospital cardiac arrest.  EP patient received 5 shocks with EMS prior to arrival with 2 rounds of epi.  On ED arrival patient was seen hemodynamically stable but emergently intubated for airway protection.  Vast majority of medical workup pending at time of assessment including CBC, CMP, and imaging.  PCCM consulted for further management admission.  Pertinent  Medical History  Metastatic lung cancer, prior DVT, HTN, HLD, tobacco abuse, DVT, and anxiety  Significant Hospital Events: Including procedures, antibiotic start and stop dates in addition to other pertinent events     Interim History / Subjective:  Opening eyes off sedation Not following commands Tries to raise head off bed with stimulation  Objective   Blood pressure 126/82, pulse 88, temperature 98.8 F (37.1 C), resp. rate (!) 22, height 5\' 2"  (1.575 m), weight 67.8 kg, SpO2 100%.    Vent Mode: PRVC FiO2 (%):  [80 %-100 %] 80 % Set Rate:  [18 bmp-22 bmp] 22 bmp Vt Set:  [400 mL] 400 mL PEEP:  [5 cmH20] 5 cmH20 Plateau Pressure:  [19 cmH20] 19 cmH20   Intake/Output Summary (Last 24 hours) at 06/04/2023 4782 Last data filed at 06/04/2023 0600 Gross per 24 hour  Intake 1978.57 ml  Output 1250 ml  Net 728.57 ml   Filed Weights   06/03/23 1539 06/04/23 0423  Weight: 63.8 kg 67.8 kg    Examination: No distress Triggering vent Opening eyes to voice but not tracking +corneals, pupillary Extensor posturing to pain, not localizing  CBC stable Chemistry pending Trop leak  noted  Resolved Hospital Problem list     Assessment & Plan:  OHCA- vfib initial rhtyhm, question of preceding seizure Hyperglycemia now hypoglycemia- hx DM on steroids; probably initial epi effect Post arrest ALI Post arrest encephalopathy- improved exam from yesterday, initial head CT neg 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

## 2023-06-04 NOTE — Significant Event (Signed)
Hypoglycemic Event  CBG: 58  Treatment: D50 25 mL (12.5 gm)  Symptoms: None  Follow-up CBG: Time: 0835 CBG Result: 158  Possible Reasons for Event: Inadequate meal intake  Comments/MD notified: Katrinka Blazing, MD    Virginia [name]

## 2023-06-04 NOTE — Progress Notes (Signed)
06/04/2023 Starting to wake up and follow commands. Let rest today and consider 1 way extubation tomorrow depending on how she looks and family discussions.  Myrla Halsted MD PCCM

## 2023-06-05 ENCOUNTER — Other Ambulatory Visit: Payer: Self-pay

## 2023-06-05 ENCOUNTER — Inpatient Hospital Stay (HOSPITAL_COMMUNITY): Payer: Medicare Other

## 2023-06-05 DIAGNOSIS — R609 Edema, unspecified: Secondary | ICD-10-CM | POA: Diagnosis not present

## 2023-06-05 DIAGNOSIS — Z7189 Other specified counseling: Secondary | ICD-10-CM

## 2023-06-05 DIAGNOSIS — Z515 Encounter for palliative care: Secondary | ICD-10-CM

## 2023-06-05 DIAGNOSIS — I469 Cardiac arrest, cause unspecified: Secondary | ICD-10-CM | POA: Diagnosis not present

## 2023-06-05 LAB — BASIC METABOLIC PANEL
Anion gap: 10 (ref 5–15)
BUN: 41 mg/dL — ABNORMAL HIGH (ref 8–23)
CO2: 26 mmol/L (ref 22–32)
Calcium: 7.4 mg/dL — ABNORMAL LOW (ref 8.9–10.3)
Chloride: 105 mmol/L (ref 98–111)
Creatinine, Ser: 1.2 mg/dL — ABNORMAL HIGH (ref 0.44–1.00)
GFR, Estimated: 50 mL/min — ABNORMAL LOW (ref >60)
Glucose, Bld: 201 mg/dL — ABNORMAL HIGH (ref 70–99)
Potassium: 3.1 mmol/L — ABNORMAL LOW (ref 3.5–5.1)
Sodium: 141 mmol/L (ref 135–145)

## 2023-06-05 LAB — CBC
HCT: 25.3 % — ABNORMAL LOW (ref 36.0–46.0)
Hemoglobin: 9 g/dL — ABNORMAL LOW (ref 12.0–15.0)
MCH: 33.8 pg (ref 26.0–34.0)
MCHC: 35.6 g/dL (ref 30.0–36.0)
MCV: 95.1 fL (ref 80.0–100.0)
Platelets: 193 10*3/uL (ref 150–400)
RBC: 2.66 MIL/uL — ABNORMAL LOW (ref 3.87–5.11)
RDW: 15.8 % — ABNORMAL HIGH (ref 11.5–15.5)
WBC: 9.3 10*3/uL (ref 4.0–10.5)
nRBC: 0 % (ref 0.0–0.2)

## 2023-06-05 LAB — TRIGLYCERIDES: Triglycerides: 134 mg/dL (ref <150)

## 2023-06-05 LAB — GLUCOSE, CAPILLARY
Glucose-Capillary: 164 mg/dL — ABNORMAL HIGH (ref 70–99)
Glucose-Capillary: 151 mg/dL — ABNORMAL HIGH (ref 70–99)
Glucose-Capillary: 165 mg/dL — ABNORMAL HIGH (ref 70–99)
Glucose-Capillary: 169 mg/dL — ABNORMAL HIGH (ref 70–99)
Glucose-Capillary: 148 mg/dL — ABNORMAL HIGH (ref 70–99)

## 2023-06-05 LAB — MAGNESIUM: Magnesium: 2.7 mg/dL — ABNORMAL HIGH (ref 1.7–2.4)

## 2023-06-05 MED ORDER — ORAL CARE MOUTH RINSE: 15.0000 mL | OROMUCOSAL | Status: DC | PRN

## 2023-06-05 MED ORDER — POTASSIUM CHLORIDE 10 MEQ/100ML IV SOLN
10.0000 meq | INTRAVENOUS | Status: AC
  Administered 2023-06-05 (×4): 10 meq via INTRAVENOUS
  Filled 2023-06-05 (×4): qty 100

## 2023-06-05 MED ORDER — POTASSIUM CHLORIDE 20 MEQ PO PACK
20.0000 meq | PACK | ORAL | Status: AC
  Administered 2023-06-05: 20 meq
  Filled 2023-06-05 (×2): qty 1

## 2023-06-05 MED ORDER — ORAL CARE MOUTH RINSE
15.0000 mL | OROMUCOSAL | Status: DC
  Administered 2023-06-06 – 2023-06-10 (×15): 15 mL via OROMUCOSAL

## 2023-06-05 MED ORDER — KETOROLAC TROMETHAMINE 15 MG/ML IJ SOLN
7.5000 mg | Freq: Once | INTRAMUSCULAR | Status: AC
  Administered 2023-06-05: 7.5 mg via INTRAVENOUS
  Filled 2023-06-05: qty 1

## 2023-06-05 MED ORDER — ALPRAZOLAM 0.25 MG PO TABS: 0.1250 mg | ORAL_TABLET | Freq: Three times a day (TID) | ORAL | Status: DC | PRN

## 2023-06-05 MED ORDER — SODIUM CHLORIDE 0.9 % IV SOLN
2.0000 g | Freq: Every day | INTRAVENOUS | Status: DC
  Administered 2023-06-05 – 2023-06-06 (×2): 2 g via INTRAVENOUS
  Filled 2023-06-05 (×3): qty 20

## 2023-06-05 MED ORDER — FENTANYL CITRATE PF 50 MCG/ML IJ SOSY
50.0000 ug | PREFILLED_SYRINGE | INTRAMUSCULAR | Status: DC | PRN
  Administered 2023-06-05: 50 ug via INTRAVENOUS
  Filled 2023-06-05: qty 1

## 2023-06-05 NOTE — Evaluation (Signed)
Clinical/Bedside Swallow Evaluation Patient Details  Name: JOSSLYNN BARRESE MRN: [id number] Date of Birth: 01-14-1956  Today's Date: 06/05/2023 Time: SLP Start Time (ACUTE ONLY): 1340 SLP Stop Time (ACUTE ONLY): 1355 SLP Time Calculation (min) (ACUTE ONLY): 15 min  Past Medical History:  Past Medical History:  Diagnosis Date   Anemia    Anxiety    Concussion 09/28/2019   DVT (deep venous thrombosis) (HCC) 2021   L leg   Dyspnea    GERD (gastroesophageal reflux disease)    Hypercholesterolemia    per pt, she does not have elevated lipids   Hypertension    met lung ca dx'd 09/2019   mets to spine, hip and brain   PONV (postoperative nausea and vomiting)    Tobacco abuse    Past Surgical History:  Past Surgical History:  Procedure Laterality Date   ABDOMINAL HYSTERECTOMY     partial/ left ovaries   APPLICATION OF CRANIAL NAVIGATION N/A 12/02/2020   Procedure: APPLICATION OF CRANIAL NAVIGATION;  Surgeon: Maeola Harman, MD;  Location[facility] OR;  Service: Neurosurgery;  Laterality: N/A;   CHOLECYSTECTOMY     CRANIOTOMY N/A 12/02/2020   Procedure: Posterior fossa craniotomy for tumor resection with brainlab;  Surgeon: Maeola Harman, MD;  Location: [facility] OR;  Service: Neurosurgery;  Laterality: N/A;   DILATION AND CURETTAGE OF UTERUS     IR IMAGING GUIDED PORT INSERTION  10/23/2019   IR PATIENT EVAL TECH 0-60 MINS  11/21/2021   IR PATIENT EVAL TECH 0-60 MINS  11/24/2021   IR PATIENT EVAL TECH 0-60 MINS  11/29/2021   IR PATIENT EVAL TECH 0-60 MINS  12/04/2021   IR PATIENT EVAL TECH 0-60 MINS  12/12/2021   IR PATIENT EVAL TECH 0-60 MINS  12/19/2021   IR PATIENT EVAL TECH 0-60 MINS  12/27/2021   IR PATIENT EVAL TECH 0-60 MINS  01/03/2022   IR PATIENT EVAL TECH 0-60 MINS  01/10/2022   IR PATIENT EVAL TECH 0-60 MINS  01/18/2022   IR PATIENT EVAL TECH 0-60 MINS  02/13/2022   IR PATIENT EVAL TECH 0-60 MINS  02/20/2022   IR RADIOLOGIST EVAL & MGMT  01/23/2022   IR REMOVAL TUN ACCESS W/ PORT W/O FL MOD SED   11/17/2021   KYPHOPLASTY N/A 03/15/2020   Procedure: Thoracic Eight KYPHOPLASTY;  Surgeon: Maeola Harman, MD;  Location: [facility] OR;  Service: Neurosurgery;  Laterality: N/A;  prone    LIPOMA EXCISION  2018   removed under left breast and right thigh.   TUBAL LIGATION     HPI:  Virginia [name] is a 67 y.o. with past medical history significant for metastatic lung cancer, prior DVT, HTN, HLD, tobacco abuse, DVT, and anxiety who presented to the ED 9/23 after suffering a witnessed out-of-hospital cardiac arrest.  EP patient received 5 shocks with EMS prior to arrival with 2 rounds of epi.  On ED arrival patient was seen hemodynamically stable but emergently intubated for airway protection. extubated 9/25    Assessment / Plan / Recommendation  Clinical Impression  Pt demonstrates anterior spillage with thin liquids, appearance of timing impairment and delayed coughing. Pt tolerated ice well and puree. OK for ice chips and meds in puree as needed. Likely just needs some more time post extubation to recover given weakness and chronic illness. Will f/u for PO trials tomorrow am for diet vs instrumental testing. SLP Visit Diagnosis: Dysphagia, oropharyngeal phase (R13.12)    Aspiration Risk  Moderate aspiration risk    Diet Recommendation  Ice chips PRN after oral care;Dysphagia 1 (Puree)    Medication Administration: Whole meds with puree Supervision: Staff to assist with self feeding Compensations: Slow rate;Small sips/bites Postural Changes: Seated upright at 90 degrees    Other  Recommendations Oral Care Recommendations: Oral care BID Caregiver Recommendations: Have oral suction available    Recommendations for follow up therapy are one component of a multi-disciplinary discharge planning process, led by the attending physician.  Recommendations may be updated based on patient status, additional functional criteria and insurance authorization.  Follow up Recommendations        Assistance  Recommended at Discharge    Functional Status Assessment Patient has had a recent decline in their functional status and demonstrates the ability to make significant improvements in function in a reasonable and predictable amount of time.  Frequency and Duration min 2x/week  2 weeks       Prognosis Prognosis for improved oropharyngeal function: Good      Swallow Study   General HPI: Virginia [name] is a 67 y.o. with past medical history significant for metastatic lung cancer, prior DVT, HTN, HLD, tobacco abuse, DVT, and anxiety who presented to the ED 9/23 after suffering a witnessed out-of-hospital cardiac arrest.  EP patient received 5 shocks with EMS prior to arrival with 2 rounds of epi.  On ED arrival patient was seen hemodynamically stable but emergently intubated for airway protection. extubated 9/25 Type of Study: Bedside Swallow Evaluation Previous Swallow Assessment: none Diet Prior to this Study: NPO Temperature Spikes Noted: No History of Recent Intubation: Yes Total duration of intubation (days): 3 days Date extubated: 06/05/23 Behavior/Cognition: Alert;Cooperative;Pleasant mood Oral Cavity Assessment: Within Functional Limits Oral Care Completed by SLP: No Oral Cavity - Dentition: Adequate natural dentition Vision: Functional for self-feeding Self-Feeding Abilities: Needs assist Patient Positioning: Upright in bed Baseline Vocal Quality: Low vocal intensity Volitional Cough: Congested Volitional Swallow: Able to elicit    Oral/Motor/Sensory Function Overall Oral Motor/Sensory Function: Generalized oral weakness   Ice Chips     Thin Liquid Thin Liquid: Impaired Presentation: Cup;Straw Oral Phase Impairments: Reduced labial seal Oral Phase Functional Implications: Right anterior spillage;Left anterior spillage Pharyngeal  Phase Impairments: Cough - Delayed    Nectar Thick Nectar Thick Liquid: Not tested   Honey Thick Honey Thick Liquid: Not tested   Puree Puree:  Within functional limits   Solid     Solid: Within functional limits      Paton Crum, Riley Nearing 06/05/2023,2:35 PM

## 2023-06-05 NOTE — Consult Note (Signed)
Consultation Note Date: 06/05/2023   Patient Name: Virginia [name]  DOB: 1955/11/27  MRN: [id number]  Age / Sex: 67 y.o., female  PCP: [name], MD Referring Physician: Lorin Glass, MD  Reason for Consultation: Establishing goals of care  HPI/Patient Profile: 67 y.o. female  with past medical history of NSCLC mets to brain and bone since Feb 2021, prior DVT, HTN, HLD, tobacco abuse, anxiety admitted on 06/03/2023 with out of hospital cardiac arrest receiving 5 shocks and 2 rounds of epi by EMS. Intubated in ED for airway protection. Extubated 9/25.   Clinical Assessment and Goals of Care: Consult received and chart review completed. I met today with Virginia [name] along with her husband and daughter at bedside. Virginia [name] was recently extubated this morning. She is awake and oriented although very weak. She has weak cough. She smiles and is in overall good spirits. All have understanding of her underlying condition as she has been managing stage 4 cancer for ~3.5 years. Her husband shares that she is the toughest person he knows. He says that she has a strong desire to live. She was able to manage around the home with assistance from husband, Virginia [name]. She dreaded having to go up their stairs to go to bed so she often puts this off. Virginia [name] inquires about hospital/mechanical bed for the home to put on lower level but for a larger queen size so they can continue to be together.   We reviewed goals of care. We discussed time for outcomes to see how she does and what her new normal may be. We discussed SLP and concern as her cough is very weak and she is unable to expectorate. We discussed her wishes if she were to decline instead of improve. I asked her if she would want to go back on the ventilator if indicated. After discussion Virginia [name] tells [name] she does not want to go back on ventilator because she knows she is going to heaven.  She wants to be comfortable at the end of her life. She is still hopeful for more time with her family and that we can assist her with more conservative treatment. Ultimately she wishes to return back to her home. Virginia [name] tells me that Virginia [name] has told them that she wishes to die at home.   I discussed further with Virginia [name] privately. I reviewed with him that if Virginia [name] were to be reintubated in her weakened state I would not expect a good outcome. I believe that conservative treatment without intubation is reasonable and Virginia [name] agrees. We discussed timing of hospitalization depends on progress and we will see what her needs are before a return home. Virginia [name] is aware that at any point we can arrange for hospice support to get Virginia [name] home if this is her wish. We will continue to meet daily to discuss progress and plan from here.   All questions/concerns addressed. Emotional support provided. Updated Dr. Katrinka Blazing and RN. Updated Dr. Barbaraann Cao and Dr. [name].   Primary Decision Maker PATIENT  SUMMARY OF RECOMMENDATIONS   - DNR/DNI - Continue treatment but no plans for intubation - Time for outcomes - Ongoing discussion for goals and support at home  Code Status/Advance Care Planning: DNR   Symptom Management:  Per PCCM Recommend to continue symptom management regimen if passes swallow eval.  HOME REGIMEN:  Pain, acute on chronic in setting of metastatic non-small cell lung cancer with disease to brain and bone Continue Xtampza 13.5 mg every 12 hours scheduled Continue oxycodone 5 mg every 4 hours as needed  Nausea/Vomiting Continue olanzapine 2.5mg  in AM and 5mg  at bedtime  Prognosis:  Overall prognosis poor.   Discharge Planning: To Be Determined      Primary Diagnoses: Present on Admission:  Cardiac arrest [facility])   I have reviewed the medical record, interviewed the patient and family, and examined the patient. The following aspects are pertinent.  Past Medical History:  Diagnosis Date    Anemia    Anxiety    Concussion 09/28/2019   DVT (deep venous thrombosis) (HCC) 2021   L leg   Dyspnea    GERD (gastroesophageal reflux disease)    Hypercholesterolemia    per pt, she does not have elevated lipids   Hypertension    met lung ca dx'd 09/2019   mets to spine, hip and brain   PONV (postoperative nausea and vomiting)    Tobacco abuse    Social History   Socioeconomic History   Marital status: Married    Spouse name: Not on file   Number of children: 3   Years of education: Not on file   Highest education level: Not on file  Occupational History   Occupation: owns Editor, commissioning [employer]    Employer: [employer]  Tobacco Use   Smoking status: Former    Current packs/day: 0.00    Types: Cigarettes    Quit date: 10/31/2012    Years since quitting: 10.6   Smokeless tobacco: Never  Vaping Use   Vaping status: Never Used  Substance and Sexual Activity   Alcohol use: Yes    Alcohol/week: 0.0 standard drinks of alcohol    Comment: rare   Drug use: No   Sexual activity: Yes  Other Topics Concern   Not on file  Social History Narrative   Not on file   Social Determinants of Health   Financial Resource Strain: Not on file  Food Insecurity: No Food Insecurity (05/04/2023)   Hunger Vital Sign    Worried About Running Out of Food in the Last Year: Never true    Ran Out of Food in the Last Year: Never true  Transportation Needs: No Transportation Needs (05/04/2023)   PRAPARE - Administrator, Civil Service (Medical): No    Lack of Transportation (Non-Medical): No  Physical Activity: Not on file  Stress: Not on file  Social Connections: Not on file   Family History  Problem Relation Age of Onset   Heart disease Father    Drug abuse Daughter    Drug abuse Son    Cancer Sister    Heart disease Brother    Heart attack Brother    Scheduled Meds:  Chlorhexidine Gluconate Cloth  6 each Topical Daily   dexamethasone (DECADRON) injection  3 mg Intravenous  Daily   docusate  100 mg Per Tube BID   enoxaparin (LOVENOX) injection  40 mg Subcutaneous Q24H   famotidine  20 mg Per Tube BID   feeding supplement (PROSource TF20)  60 mL  Per Tube Daily   mouth rinse  15 mL Mouth Rinse Q2H   polyethylene glycol  17 g Per Tube Daily   potassium chloride  20 mEq Per Tube Q4H   Continuous Infusions:  amiodarone 30 mg/hr (06/05/23 0800)   cefTRIAXone (ROCEPHIN)  IV     feeding supplement (VITAL AF 1.2 CAL) 50 mL/hr at 06/05/23 0800   levETIRAcetam Stopped (06/05/23 0556)   potassium chloride 10 mEq (06/05/23 0942)   propofol (DIPRIVAN) infusion Stopped (06/05/23 0557)   PRN Meds:.acetaminophen, fentaNYL (SUBLIMAZE) injection, fentaNYL (SUBLIMAZE) injection, midazolam, ondansetron (ZOFRAN) IV, mouth rinse, polyethylene glycol No Known Allergies Review of Systems  Constitutional:  Positive for activity change and fatigue.  Respiratory:  Positive for cough.   Neurological:  Positive for weakness.    Physical Exam Vitals and nursing note reviewed.  Constitutional:      Appearance: She is ill-appearing.     Comments: Generalized weakness and fatigue  Cardiovascular:     Rate and Rhythm: Normal rate.  Pulmonary:     Effort: No tachypnea, accessory muscle usage or respiratory distress.     Comments: Weak congested cough Abdominal:     Palpations: Abdomen is soft.  Neurological:     Mental Status: She is alert and oriented to person, place, and time.     Vital Signs: BP 127/71 (BP Location: Left Arm)   Pulse 87   Temp 99.7 F (37.6 C) (Bladder)   Resp 19   Ht 5\' 2"  (1.575 m)   Wt 68.6 kg   SpO2 94%   BMI 27.66 kg/m  Pain Scale: CPOT   Pain Score: Asleep   SpO2: SpO2: 94 % O2 Device:SpO2: 94 % O2 Flow Rate: .O2 Flow Rate (L/min): 4 L/min  IO: Intake/output summary:  Intake/Output Summary (Last 24 hours) at 06/05/2023 1030 Last data filed at 06/05/2023 0800 Gross per 24 hour  Intake 2378.6 ml  Output 1645 ml  Net 733.6 ml     LBM: Last BM Date :  (PTA) Baseline Weight: Weight: 63.8 kg Most recent weight: Weight: 68.6 kg     Palliative Assessment/Data:   Time Total: 80 min  Greater than 50%  of this time was spent counseling and coordinating care related to the above assessment and plan.  Signed by: Yong Channel, NP Palliative Medicine Team Pager # [phone] (M-F 8a-5p) Team Phone # [phone] (Nights/Weekends)

## 2023-06-05 NOTE — Plan of Care (Signed)

## 2023-06-05 NOTE — Progress Notes (Signed)
06/05/2023 Doing great off vent.  Minimal to no new deficits. LE duplex with small age indeterminate clot. SLP has seen and will follow PT/OT to see  Remaining issues are now as follows: PT/OT eval SLP final recs Decision on [facility] (recommend AM onc consult) Decision on AED type and duration (recommend AM neuro consult) Final dispo (family does not seem aligned with hospice at this time but are considering, palliative following)  Patient stable for transfer to progressive and appreciate [facility] taking over 06/06/23  Family updated on plan above.  Myrla Halsted MD PCCM

## 2023-06-05 NOTE — Progress Notes (Signed)
NAME:  Virginia [name], MRN:  [id number], DOB:  [birth year], LOS: 2 ADMISSION DATE:  06/03/2023, CONSULTATION DATE:  06/03/2023 REFERRING MD:  Dr. Jeraldine Loots - EDP, CHIEF COMPLAINT:  [facility] cardiac arrest    History of Present Illness:  Virginia [name] is a 68 y.o. with past medical history significant for metastatic lung cancer, prior DVT, HTN, HLD, tobacco abuse, DVT, and anxiety who presented to the ED 9/23 after suffering a witnessed out-of-hospital cardiac arrest.  EP patient received 5 shocks with EMS prior to arrival with 2 rounds of epi.  On ED arrival patient was seen hemodynamically stable but emergently intubated for airway protection.  Vast majority of medical workup pending at time of assessment including CBC, CMP, and imaging.  PCCM consulted for further management admission.  Pertinent  Medical History  Metastatic lung cancer, prior DVT, HTN, HLD, tobacco abuse, DVT, and anxiety  Significant Hospital Events: Including procedures, antibiotic start and stop dates in addition to other pertinent events     Interim History / Subjective:  Now awake and following commands.  Objective   Blood pressure 93/72, pulse 67, temperature 99.3 F (37.4 C), resp. rate (!) 22, height 5\' 2"  (1.575 m), weight 68.6 kg, SpO2 100%.    Vent Mode: PRVC FiO2 (%):  [40 %-60 %] 40 % Set Rate:  [22 bmp] 22 bmp Vt Set:  [400 mL] 400 mL PEEP:  [5 cmH20] 5 cmH20 Plateau Pressure:  [10 cmH20-12 cmH20] 10 cmH20   Intake/Output Summary (Last 24 hours) at 06/05/2023 0708 Last data filed at 06/05/2023 0600 Gross per 24 hour  Intake 2299.99 ml  Output 2010 ml  Net 289.99 ml   Filed Weights   06/03/23 1539 06/04/23 0423 06/05/23 0500  Weight: 63.8 kg 67.8 kg 68.6 kg    Examination: No distress Follows commands Rhonci and some bloody secretions Triggers vent Ext warm Abd soft  Potassium being repeted  Resolved Hospital Problem list     Assessment & Plan:  [facility]- vfib initial rhtyhm, question  of preceding seizure Hyperglycemia now hypoglycemia- hx DM on steroids; probably initial epi effect Post arrest ALI Post arrest respiratory failure with likely aspiration- improving Post arrest encephalopathy- improving 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

## 2023-06-05 NOTE — Procedures (Signed)
Extubation Procedure Note  Patient Details:   Name: Virginia [name] DOB: 1956/08/15 MRN: [id number]   Airway Documentation:    Vent end date: 06/05/23 Vent end time: 0828   Evaluation  O2 sats: stable throughout Complications: No apparent complications Patient did tolerate procedure well. Bilateral Breath Sounds: Rhonchi   Yes  Patient was extubated to a 4L [place] without any complications, dyspnea or stridor noted. Positive cuff leak prior to extubation.   Carlynn Spry 06/05/2023, 8:28 AM

## 2023-06-05 NOTE — Plan of Care (Signed)

## 2023-06-05 NOTE — Progress Notes (Signed)
[facility] ADULT ICU REPLACEMENT PROTOCOL   The patient does apply for the [facility] Adult ICU Electrolyte Replacment Protocol based on the criteria listed below:   1.Exclusion criteria: TCTS, ECMO, Dialysis, and Myasthenia Gravis patients 2. Is GFR >/= 30 ml/min? Yes.    Patient's GFR today is 50 3. Is SCr </= 2? Yes.   Patient's SCr is 1.2 mg/dL 4. Did SCr increase >/= 0.5 in 24 hours? No. 5.Pt's weight >40kg  Yes.   6. Abnormal electrolyte(s): k 3.1  7. Electrolytes replaced per protocol 8.  Call MD STAT for K+ </= 2.5, Phos </= 1, or Mag </= 1 Physician:    Markus Daft A 06/05/2023 4:56 AM

## 2023-06-05 NOTE — Progress Notes (Signed)
Lower extremity venous duplex completed. Please see CV Procedures for preliminary results.  Initial findings reported to Curt Jews, Charity fundraiser.  Shona Simpson, RVT 06/05/23 1:36 PM

## 2023-06-06 ENCOUNTER — Inpatient Hospital Stay (HOSPITAL_COMMUNITY): Payer: Medicare Other

## 2023-06-06 ENCOUNTER — Other Ambulatory Visit: Payer: Medicare Other

## 2023-06-06 ENCOUNTER — Encounter: Payer: Self-pay | Admitting: Internal Medicine

## 2023-06-06 DIAGNOSIS — Z515 Encounter for palliative care: Secondary | ICD-10-CM | POA: Diagnosis not present

## 2023-06-06 DIAGNOSIS — Z7189 Other specified counseling: Secondary | ICD-10-CM | POA: Diagnosis not present

## 2023-06-06 DIAGNOSIS — I469 Cardiac arrest, cause unspecified: Secondary | ICD-10-CM

## 2023-06-06 LAB — CBC
HCT: 21.9 % — ABNORMAL LOW (ref 36.0–46.0)
Hemoglobin: 7.6 g/dL — ABNORMAL LOW (ref 12.0–15.0)
MCH: 34.1 pg — ABNORMAL HIGH (ref 26.0–34.0)
MCHC: 34.7 g/dL (ref 30.0–36.0)
MCV: 98.2 fL (ref 80.0–100.0)
Platelets: 198 10*3/uL (ref 150–400)
RBC: 2.23 MIL/uL — ABNORMAL LOW (ref 3.87–5.11)
RDW: 16.8 % — ABNORMAL HIGH (ref 11.5–15.5)
WBC: 10 10*3/uL (ref 4.0–10.5)
nRBC: 0 % (ref 0.0–0.2)

## 2023-06-06 LAB — ECHOCARDIOGRAM COMPLETE
Area-P 1/2: 4.29 cm2
Height: 62 in
S' Lateral: 3.1 cm
Weight: 2366.86 oz

## 2023-06-06 LAB — BASIC METABOLIC PANEL
Anion gap: 7 (ref 5–15)
BUN: 31 mg/dL — ABNORMAL HIGH (ref 8–23)
CO2: 26 mmol/L (ref 22–32)
Calcium: 7.4 mg/dL — ABNORMAL LOW (ref 8.9–10.3)
Chloride: 107 mmol/L (ref 98–111)
Creatinine, Ser: 1.25 mg/dL — ABNORMAL HIGH (ref 0.44–1.00)
GFR, Estimated: 48 mL/min — ABNORMAL LOW (ref >60)
Glucose, Bld: 111 mg/dL — ABNORMAL HIGH (ref 70–99)
Potassium: 3.3 mmol/L — ABNORMAL LOW (ref 3.5–5.1)
Sodium: 140 mmol/L (ref 135–145)

## 2023-06-06 LAB — GLUCOSE, CAPILLARY
Glucose-Capillary: 107 mg/dL — ABNORMAL HIGH (ref 70–99)
Glucose-Capillary: 108 mg/dL — ABNORMAL HIGH (ref 70–99)
Glucose-Capillary: 180 mg/dL — ABNORMAL HIGH (ref 70–99)
Glucose-Capillary: 225 mg/dL — ABNORMAL HIGH (ref 70–99)
Glucose-Capillary: 99 mg/dL (ref 70–99)

## 2023-06-06 LAB — MAGNESIUM: Magnesium: 2.3 mg/dL (ref 1.7–2.4)

## 2023-06-06 MED ORDER — ACETAMINOPHEN 325 MG PO TABS: 650.0000 mg | ORAL_TABLET | Freq: Four times a day (QID) | ORAL | Status: DC | PRN

## 2023-06-06 MED ORDER — PERFLUTREN LIPID MICROSPHERE
1.0000 mL | INTRAVENOUS | Status: AC | PRN
  Administered 2023-06-06: 5 mL via INTRAVENOUS

## 2023-06-06 MED ORDER — SENNA 8.6 MG PO TABS: 1.0000 | ORAL_TABLET | Freq: Every day | ORAL | Status: DC | PRN

## 2023-06-06 MED ORDER — ENSURE ENLIVE PO LIQD
237.0000 mL | Freq: Two times a day (BID) | ORAL | Status: DC
  Administered 2023-06-06 – 2023-06-09 (×4): 237 mL via ORAL

## 2023-06-06 MED ORDER — POTASSIUM CHLORIDE CRYS ER 20 MEQ PO TBCR
40.0000 meq | EXTENDED_RELEASE_TABLET | Freq: Once | ORAL | Status: AC
  Administered 2023-06-06: 40 meq via ORAL
  Filled 2023-06-06: qty 2

## 2023-06-06 MED ORDER — HYDROCODONE-ACETAMINOPHEN 7.5-325 MG PO TABS: 1.0000 | ORAL_TABLET | Freq: Four times a day (QID) | ORAL | Status: DC | PRN

## 2023-06-06 MED ORDER — OXYCODONE HCL 5 MG PO TABS
5.0000 mg | ORAL_TABLET | ORAL | Status: DC | PRN
  Administered 2023-06-06 – 2023-06-08 (×2): 5 mg via ORAL
  Administered 2023-06-10: 10 mg via ORAL
  Administered 2023-06-10: 5 mg via ORAL
  Filled 2023-06-06 (×3): qty 1
  Filled 2023-06-06: qty 2

## 2023-06-06 MED ORDER — FAMOTIDINE 20 MG PO TABS
20.0000 mg | ORAL_TABLET | Freq: Every day | ORAL | Status: DC
  Administered 2023-06-07 – 2023-06-10 (×4): 20 mg via ORAL
  Filled 2023-06-06 (×4): qty 1

## 2023-06-06 MED ORDER — FAMOTIDINE 20 MG PO TABS
20.0000 mg | ORAL_TABLET | Freq: Two times a day (BID) | ORAL | Status: DC
  Administered 2023-06-06: 20 mg via ORAL
  Filled 2023-06-06: qty 1

## 2023-06-06 MED ORDER — DOCUSATE SODIUM 100 MG PO CAPS
100.0000 mg | ORAL_CAPSULE | Freq: Two times a day (BID) | ORAL | Status: DC
  Administered 2023-06-06 – 2023-06-10 (×8): 100 mg via ORAL
  Filled 2023-06-06 (×9): qty 1

## 2023-06-06 MED ORDER — OLANZAPINE 2.5 MG PO TABS
2.5000 mg | ORAL_TABLET | Freq: Every day | ORAL | Status: DC
  Administered 2023-06-06 – 2023-06-09 (×4): 2.5 mg via ORAL
  Filled 2023-06-06 (×5): qty 1

## 2023-06-06 MED ORDER — POLYETHYLENE GLYCOL 3350 17 G PO PACK
17.0000 g | PACK | Freq: Every day | ORAL | Status: DC
  Administered 2023-06-07 – 2023-06-09 (×3): 17 g via ORAL
  Filled 2023-06-06 (×5): qty 1

## 2023-06-06 MED ORDER — DEXAMETHASONE 0.5 MG PO TABS: 1.0000 mg | ORAL_TABLET | Freq: Every day | ORAL | Status: DC

## 2023-06-06 MED ORDER — DEXAMETHASONE 2 MG PO TABS
2.0000 mg | ORAL_TABLET | Freq: Every day | ORAL | Status: DC
  Administered 2023-06-07 – 2023-06-10 (×4): 2 mg via ORAL
  Filled 2023-06-06 (×4): qty 1

## 2023-06-06 MED ORDER — OXYCODONE HCL ER 10 MG PO T12A
10.0000 mg | EXTENDED_RELEASE_TABLET | Freq: Two times a day (BID) | ORAL | Status: DC
  Administered 2023-06-06 – 2023-06-10 (×9): 10 mg via ORAL
  Filled 2023-06-06 (×9): qty 1

## 2023-06-06 MED ORDER — POLYETHYLENE GLYCOL 3350 17 G PO PACK: 17.0000 g | PACK | Freq: Every day | ORAL | Status: DC | PRN

## 2023-06-06 MED ORDER — MECLIZINE HCL 25 MG PO TABS: 25.0000 mg | ORAL_TABLET | Freq: Three times a day (TID) | ORAL | Status: DC | PRN

## 2023-06-06 MED ORDER — POTASSIUM CHLORIDE 10 MEQ/100ML IV SOLN: 10.0000 meq | INTRAVENOUS | Status: DC

## 2023-06-06 MED ORDER — LEVETIRACETAM 500 MG PO TABS
1000.0000 mg | ORAL_TABLET | Freq: Two times a day (BID) | ORAL | Status: DC
  Administered 2023-06-06 – 2023-06-10 (×8): 1000 mg via ORAL
  Filled 2023-06-06 (×8): qty 2

## 2023-06-06 NOTE — Plan of Care (Signed)

## 2023-06-06 NOTE — TOC Initial Note (Signed)
Transition of Care [facility]) - Initial/Assessment Note    Patient Details  Name: Virginia [name] MRN: [id number] Date of Birth: 06/23/1956  Transition of Care [facility]) CM/SW Contact:    Gala Lewandowsky, RN Phone Number: 06/06/2023, 3:52 PM  Clinical Narrative:   Risk for readmission assessment completed. Patient presented post cardiac arrest. PTA patient was from home with spouse and has support of family. PT/OT recommendations for CIR-reached out to Admissions Coordinator to see if patient has been assigned. Case Manager will continue to follow for transition of care needs as the patient progresses.  Expected Discharge Plan: IP Rehab Facility Barriers to Discharge: Continued Medical Work up   Patient Goals and CMS Choice Patient states their goals for this hospitalization and ongoing recovery are:: patient and family want inpatient rehab. Expected Discharge Plan and Services     Post Acute Care Choice: IP Rehab Living arrangements for the past 2 months: Single Family Home Prior Living Arrangements/Services Living arrangements for the past 2 months: Single Family Home Lives with:: Spouse Patient language and need for interpreter reviewed:: Yes Do you feel safe going back to the place where you live?: Yes      Need for Family Participation in Patient Care: Yes (Comment) Care giver support system in place?: Yes (comment)   Criminal Activity/Legal Involvement Pertinent to Current Situation/Hospitalization: No - Comment as needed  Activities of Daily Living   ADL Screening (condition at time of admission) Does the patient have a NEW difficulty with bathing/dressing/toileting/self-feeding that is expected to last >3 days?: Yes (Initiates electronic notice to provider for possible OT consult) Does the patient have a NEW difficulty with getting in/out of bed, walking, or climbing stairs that is expected to last >3 days?: Yes (Initiates electronic notice to provider for possible  PT consult) Does the patient have a NEW difficulty with communication that is expected to last >3 days?: Yes (Initiates electronic notice to provider for possible SLP consult) Is the patient deaf or have difficulty hearing?: No Does the patient have difficulty seeing, even when wearing glasses/contacts?: No Does the patient have difficulty concentrating, remembering, or making decisions?: Yes  Permission Sought/Granted Permission sought to share information with : Family Supports, Case Manager     Emotional Assessment Appearance:: Appears stated age Attitude/Demeanor/Rapport: Engaged Affect (typically observed): Appropriate   Alcohol / Substance Use: Not Applicable Psych Involvement: No (comment)  Admission diagnosis:  Cardiac arrest [facility]) [I46.9] Patient Active Problem List   Diagnosis Date Noted   Cardiac arrest (HCC) 06/03/2023   Need for emotional support 05/09/2023   Medication management 05/09/2023   Nausea 05/09/2023   DNR (do not resuscitate) 05/09/2023   Palliative care encounter 05/09/2023   Weakness 05/08/2023   Counseling and coordination of care 05/08/2023   AKI (acute kidney injury) (HCC) 05/03/2023   Anemia 05/03/2023   Cancer associated pain 11/14/2021   Rash 11/14/2021   Hyponatremia 11/14/2021   Skin rash 11/13/2021   Hypokalemia 11/13/2021   Febrile illness 11/13/2021   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   Port-A-Cath in place 12/15/2020   Metastasis to brain [facility]) 12/02/2020   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) 03/15/2020   Goals of care, counseling/discussion 02/25/2020   Palliative care patient 12/16/2019   Malignant neoplasm metastatic to bone (HCC) 10/21/2019   Malignant neoplasm metastatic to brain (HCC) 10/15/2019   Right lower lobe lung mass 09/25/2019   COPD (chronic obstructive pulmonary disease) (HCC) 09/25/2019   Impaired glucose tolerance 12/08/2017  Vitamin D deficiency 12/08/2017   Microscopic  hematuria 04/01/2014   Hypertension 01/11/2012   Palpitations 01/11/2012   Hyperlipidemia 12/25/2011   History of smoking 12/25/2011   PCP:  [name], MD Pharmacy:   [facility] [phone] - [place], [place] - [address] AT [facility] [address] [place] [address] Phone: [phone] Fax: [phone]     Social Determinants of Health (SDOH) Social History: SDOH Screenings   Food Insecurity: No Food Insecurity (06/06/2023)  Housing: Patient Declined (06/06/2023)  Transportation Needs: No Transportation Needs (06/06/2023)  Utilities: Not At Risk (06/06/2023)  Depression (PHQ2-9): Low Risk  (11/29/2021)  Tobacco Use: Medium Risk (06/03/2023)   Readmission Risk Interventions    06/06/2023    3:51 PM 05/07/2023    1:35 PM  Readmission Risk Prevention Plan  Transportation Screening Complete Complete  PCP or Specialist Appt within 3-5 Days  Complete  HRI or Home Care Consult  Complete  Social Work Consult for Recovery Care Planning/Counseling  Complete  Palliative Care Screening  Not Applicable  Medication Review Oceanographer) Complete Complete  HRI or Home Care Consult Complete   SW Recovery Care/Counseling Consult Complete   Palliative Care Screening Complete   Skilled Nursing Facility Not Applicable

## 2023-06-06 NOTE — Progress Notes (Signed)
Inpatient Rehab Admissions Coordinator Note:   Per therapy recommendations patient was screened for CIR candidacy by Stephania Fragmin, PT. At this time, pt appears to be a potential candidate for CIR. I will place an order for rehab consult for full assessment, per our protocol.  Please contact me any with questions.Estill Dooms, PT, DPT [phone] 06/06/23 5:09 PM

## 2023-06-06 NOTE — Evaluation (Signed)
Physical Therapy Evaluation Patient Details Name: Virginia [name] MRN: [id number] DOB: [birth year] Today's Date: 06/06/2023  History of Present Illness  Virginia [name] is a 67 y.o. presented to the ED 9/23 after suffering a witnessed out-of-hospital cardiac arrest.  EP patient received 5 shocks with EMS prior to arrival with 2 rounds of epi.  On ED arrival patient was seen hemodynamically stable but emergently intubated for airway protection; extubated 9/25.EEG: severe diffuse encephalopathy. HMx: metastatic lung cancer, prior DVT, HTN, HLD, tobacco abuse, and anxiety  craniotomy and resection of tumor from L cerebellum.  Clinical Impression  Pt admitted with above diagnosis. Pt was able to participate with PT although she is much weaker than PTA per family who is present. Family provides 24 hour care and pt was walking with equipment with their assist PTA.  Family is committed to caring for pt at home therefore recommend post acute rehab > 3 hours day to incr pts strength and endurance prior to d/c home. Will follow acutely.  Pt currently with functional limitations due to the deficits listed below (see PT Problem List). Pt will benefit from acute skilled PT to increase their independence and safety with mobility to allow discharge.       If plan is discharge home, recommend the following: A lot of help with walking and/or transfers;A lot of help with bathing/dressing/bathroom;Assistance with cooking/housework;Assist for transportation;Help with stairs or ramp for entrance   Can travel by private vehicle        Equipment Recommendations Other (comment) (TBA)  Recommendations for Other Services  Rehab consult    Functional Status Assessment Patient has had a recent decline in their functional status and demonstrates the ability to make significant improvements in function in a reasonable and predictable amount of time.     Precautions / Restrictions Precautions Precautions:  Fall Restrictions Weight Bearing Restrictions: No      Mobility  Bed Mobility Overal bed mobility: Needs Assistance Bed Mobility: Supine to Sit, Rolling, Sidelying to Sit Rolling: Max assist, +2 for physical assistance Sidelying to sit: Max assist, +2 for physical assistance       General bed mobility comments: A with legs, to reach with RUE for left rail and to push up to sit    Transfers Overall transfer level: Needs assistance Equipment used: 2 person hand held assist Transfers: Sit to/from Stand, Bed to chair/wheelchair/BSC Sit to Stand: Mod assist, +2 physical assistance Stand pivot transfers: Mod assist, +2 physical assistance         General transfer comment: Pt Needed mod assist of 2 to power up from bed with assist using pad to rise. Pt needed assist to move left LE for transfer and mod verbal, tactile cues to pivot to recliner.    Ambulation/Gait                  Stairs            Wheelchair Mobility     Tilt Bed    Modified Rankin (Stroke Patients Only)       Balance Overall balance assessment: Needs assistance Sitting-balance support: Bilateral upper extremity supported, Feet supported Sitting balance-Leahy Scale: Poor Sitting balance - Comments: Losing balance all directions needing constant support mod to total assist Postural control: Left lateral lean Standing balance support: Bilateral upper extremity supported Standing balance-Leahy Scale: Poor Standing balance comment: Bil HHA with left lean +2 mod assist  Pertinent Vitals/Pain Pain Assessment Pain Assessment: Faces Faces Pain Scale: Hurts little more Pain Location: low back (chronic) Pain Descriptors / Indicators: Sore, Aching Pain Intervention(s): Limited activity within patient's tolerance, Monitored during session, Repositioned    Home Living Family/patient expects to be discharged to:: Private residence Living Arrangements:  Spouse/significant other Available Help at Discharge: Family;Available 24 hours/day Type of Home: House Home Access: Stairs to enter Entrance Stairs-Rails: Left Entrance Stairs-Number of Steps: 5 Alternate Level Stairs-Number of Steps: 18 Home Layout: Two level;Bed/bath upstairs;1/2 bath on main level Home Equipment: Rollator (4 wheels);Wheelchair - Biomedical scientist (2 wheels);BSC/3in1 Additional Comments: considering getting a stair lift or moving to a more accessible home    Prior Function Prior Level of Function : Needs assist       Physical Assist : Mobility (physical);ADLs (physical) Mobility (physical): Stairs;Gait ADLs (physical): Bathing;Dressing;Toileting;Grooming Mobility Comments: uses rollator for mobility (downstairs) and RW upstairs. spouse recently assisting with stair mgmt ADLs Comments: uses RW to bathroom upstairs, able to toilet and dress self, some assist with showers     Extremity/Trunk Assessment   Upper Extremity Assessment Upper Extremity Assessment: Defer to OT evaluation LUE Deficits / Details: old deficits since brain surgery--has movement in all joints just not normal and mostly 2/5 strength LUE Coordination: decreased gross motor;decreased fine motor    Lower Extremity Assessment Lower Extremity Assessment: LLE deficits/detail LLE Deficits / Details: Deficits since brain surgery, grossly 2-/5       Communication   Communication Communication: No apparent difficulties  Cognition Arousal: Alert Behavior During Therapy: WFL for tasks assessed/performed Overall Cognitive Status: Within Functional Limits for tasks assessed                                          General Comments General comments (skin integrity, edema, etc.): VSS with 2LO2    Exercises General Exercises - Lower Extremity Ankle Circles/Pumps: AROM, Both, 5 reps, Supine Quad Sets: AROM, Both, 5 reps, Supine Heel Slides: AAROM, Both, 5 reps,  Supine   Assessment/Plan    PT Assessment Patient needs continued PT services  PT Problem List Decreased activity tolerance;Decreased balance;Decreased coordination;Decreased mobility;Decreased knowledge of use of DME;Decreased safety awareness;Decreased knowledge of precautions;Cardiopulmonary status limiting activity;Decreased strength;Decreased range of motion       PT Treatment Interventions DME instruction;Gait training;Functional mobility training;Therapeutic activities;Therapeutic exercise;Balance training;Patient/family education;Wheelchair mobility training    PT Goals (Current goals can be found in the Care Plan section)  Acute Rehab PT Goals Patient Stated Goal: to go home PT Goal Formulation: With patient Time For Goal Achievement: 06/20/23 Potential to Achieve Goals: Good    Frequency Min 1X/week     Co-evaluation PT/OT/SLP Co-Evaluation/Treatment: Yes Reason for Co-Treatment: For patient/therapist safety;Complexity of the patient's impairments (multi-system involvement) PT goals addressed during session: Mobility/safety with mobility;Balance;Strengthening/ROM OT goals addressed during session: Strengthening/ROM;ADL's and self-care       AM-PAC PT "6 Clicks" Mobility  Outcome Measure Help needed turning from your back to your side while in a flat bed without using bedrails?: Total Help needed moving from lying on your back to sitting on the side of a flat bed without using bedrails?: Total Help needed moving to and from a bed to a chair (including a wheelchair)?: Total Help needed standing up from a chair using your arms (e.g., wheelchair or bedside chair)?: Total Help needed to walk in hospital room?: Total Help needed  climbing 3-5 steps with a railing? : Total 6 Click Score: 6    End of Session Equipment Utilized During Treatment: Gait belt;Oxygen Activity Tolerance: Patient limited by fatigue Patient left: in chair;with call bell/phone within reach;with  chair alarm set Nurse Communication: Mobility status;Need for lift equipment (use Stedy) PT Visit Diagnosis: Muscle weakness (generalized) (M62.81);Unsteadiness on feet (R26.81);Other abnormalities of gait and mobility (R26.89)    Time: 1610-9604 PT Time Calculation (min) (ACUTE ONLY): 26 min   Charges:   PT Evaluation $PT Eval Moderate Complexity: 1 Mod   PT General Charges $$ ACUTE PT VISIT: 1 Visit         Aaran Enberg M,PT Acute Rehab Services [phone]   Bevelyn Buckles 06/06/2023, 12:46 PM

## 2023-06-06 NOTE — Progress Notes (Addendum)
Virginia [name]  WJX:[id number] DOB: 19-Aug-1956 DOA: 06/03/2023 PCP: [name], MD    Brief Narrative:  67 year old with a history of metastatic lung cancer with lesions of the brain, DVT, HTN, HLD, tobacco abuse, and anxiety disorder who presented to the ED 9/23 following a witnessed out-of-hospital cardiac arrest.  She received 5 shocks via EMS for reported VF prior to her arrival and 2 rounds of epinephrine.  On ED arrival she was actually hemodynamically stable but was emergently intubated for airway protection.  Family reported concern that her arrest may have been preceded by seizure activity.  She was initially admitted to the ICU and cared for by PCCM.  She was able to be rapidly weaned from the ventilator and extubated 06/05/2023 at 08:28.  To the surprise in the light of the medical team she appeared to be mentally intact with no focal deficits following extubation.  Goals of Care:   Code Status: Limited: Do not attempt resuscitation (DNR) -DNR-LIMITED -Do Not Intubate/DNI    DVT prophylaxis: enoxaparin (LOVENOX) injection 40 mg Start: 06/04/23 1600 SCDs Start: 06/03/23 1618   Interim Hx: Afebrile.  Vital signs stable.  Oxygen saturation 97% on 2 L nasal cannula.  Alert and conversant today.  Does not remember events leading to her hospitalization.  Some chest pain of the musculoskeletal nature reported today.  No shortness of breath.  No abdominal pain.  In remarkably good spirits.  Assessment & Plan:  Out-of-hospital V-fib cardiac arrest Underwent shock x 5 and epi x 2 - family suggest arrest preceded by possible seizure described as rhoncerous upper airway sounds/grunting, but not tonic/clonic type motion - now successfully extubated and appears remarkably stable - TTE pending to evaluate for structural heart disease, mostly for prognostic purposes - doubt patient would be a candidate for a device, but antiarrythmic therapy would be reasonable if any is felt to be  appropriate   Postarrest acute lung injury - postarrest acute aspiration pneumonitis Care initially provided by PCCM -respiratory status appears stable with patient now successfully extubated and weaned off O2  Possible seizure Family reported sx they interpreted as a seizure preceding her cardiac arrest -she would certainly be at risk for this with multiple brain mets -will ask Neurology to comment on most appropriate AED for prophylaxis - EEG 9/23 suggestive of severe diffuse encephalopathy with no seizure or epileptiform discharges noted - no active seizure activity appreciated since hospitalization - it is certainly possible that rather than seizure she was experiencing arrhythmia related syncope leading to her respiratory irregularities though she of course would be at risk for seizure activity as well  Multiple bilateral rib fractures As noted on CT chest 9/23 -due to CPR -pain management  Postarrest encephalopathy Mental status appears to be rapidly improving  Age-indeterminate DVT of the left posterior tibial veins Noted on venous duplex 9/25 -has a prior history of brain bleed dating to 2021 (as noted on MRI) while on Eliquis therapy -known history of left lower extremity DVT 11/09/2019 -given the patient has no acute symptoms to suggest that the clot noted on our current study is acute and given that this is the location of her known prior clot and with her history of bleeding into the brain I do not feel that anticoagulation is appropriate  Metastatic lung cancer with mets to brain Receiving ongoing care at [facility] per Dr. [name] and Dr. Barbaraann Cao - I have added them both to her inpatient care team and made them aware  of her admissoin - she is scheduled for tx 9/27 but this will clearly need to be cancelled for now   Normocytic anemia Hemoglobin has been on a steady downward trend since admission - no obvious source of blood loss, but is at risk for occult bleeding into the  chest cavity - f/u Hgb in AM an image further if Hgb continues to drop   Hypokalemia Supplement to goal of 4.0 and follow  Family Communication: Spoke with husband and children at bedside at length Disposition: Will depend on performance with PT/OT but anticipate probable discharge home   Objective: Blood pressure 125/85, pulse 67, temperature (!) 97.5 F (36.4 C), temperature source Axillary, resp. rate 19, height 5\' 2"  (1.575 m), weight 67.1 kg, SpO2 100%.  Intake/Output Summary (Last 24 hours) at 06/06/2023 0931 Last data filed at 06/06/2023 1478 Gross per 24 hour  Intake 850.95 ml  Output 3005 ml  Net -2154.05 ml   Filed Weights   06/05/23 0500 06/05/23 2251 06/06/23 0412  Weight: 68.6 kg 67.1 kg 67.1 kg    Examination: General: No acute respiratory distress Lungs: Clear to auscultation bilaterally without wheezes or crackles Cardiovascular: Regular rate and rhythm without murmur gallop or rub normal S1 and S2 Abdomen: Nontender, nondistended, soft, bowel sounds positive, no rebound, no ascites, no appreciable mass Extremities: No significant cyanosis, clubbing, or edema bilateral lower extremities  CBC: Recent Labs  Lab 06/03/23 1453 06/03/23 1618 06/04/23 0706 06/05/23 0311 06/06/23 0436  WBC 15.1*   < > 12.4* 9.3 10.0  NEUTROABS 10.4*  --   --   --   --   HGB 10.2*   < > 11.6* 9.0* 7.6*  HCT 31.7*   < > 32.8* 25.3* 21.9*  MCV 100.6*   < > 93.2 95.1 98.2  PLT 144*   < > 208 193 198   < > = values in this interval not displayed.   Basic Metabolic Panel: Recent Labs  Lab 06/04/23 0421 06/04/23 0706 06/05/23 0311 06/06/23 0436  NA  --  138 141 140  K  --  3.3* 3.1* 3.3*  CL  --  101 105 107  CO2  --  23 26 26   GLUCOSE  --  85 201* 111*  BUN  --  44* 41* 31*  CREATININE  --  1.33* 1.20* 1.25*  CALCIUM  --  7.5* 7.4* 7.4*  MG 1.4*  --  2.7* 2.3  PHOS 5.0*  --   --   --    GFR: Estimated Creatinine Clearance: 40.3 mL/min (A) (by C-G formula based on  SCr of 1.25 mg/dL (H)).   Scheduled Meds:  Chlorhexidine Gluconate Cloth  6 each Topical Daily   dexamethasone (DECADRON) injection  3 mg Intravenous Daily   docusate sodium  100 mg Oral BID   enoxaparin (LOVENOX) injection  40 mg Subcutaneous Q24H   famotidine  20 mg Oral BID   mouth rinse  15 mL Mouth Rinse 4 times per day   polyethylene glycol  17 g Oral Daily   Continuous Infusions:  amiodarone 30 mg/hr (06/06/23 0128)   cefTRIAXone (ROCEPHIN)  IV 2 g (06/06/23 0820)   levETIRAcetam 1,000 mg (06/06/23 0612)     LOS: 3 days   Lonia Blood, MD [facility] Office  [phone] Pager - Text Page per Loretha Stapler  If 7PM-7AM, please contact night-coverage per Amion 06/06/2023, 9:31 AM

## 2023-06-06 NOTE — Progress Notes (Signed)
Palliative:  HPI: 67 y.o. female  with past medical history of NSCLC mets to brain and bone since Feb 2021, prior DVT, HTN, HLD, tobacco abuse, anxiety admitted on 06/03/2023 with out of hospital cardiac arrest receiving 5 shocks and 2 rounds of epi by EMS. Intubated in ED for airway protection. Extubated 9/25.   I met again today with [name] along with husband and daughter at bedside. [name] has just finished working with therapy and is up in recliner. PT/OT recommending CIR at this time. Patient and family agree with referral and assessment for CIR. She was cleared by SLP this morning for regular diet. Her voice and cough are much stronger than yesterday. She is limited in her cough by pain from rib fractures secondary to CPR. We discussed hugging pillow when coughing deep breathing. I also discussed with [name] her pain and she tells me that she is having pain in her back (chronic pain secondary to cancer) and chest from rib fractures. We reviewed her home pain regimen and this was restarted. We discussed the importance of deep breathes and IS when pain medication on board to assist with lung health. We discussed the importance of nutrition to assist with energy needed to participate in therapy. She does state her goal is to get stronger before returning home. She continues to tell me that she is not worried because she is going to [place] but is hopeful to enjoy more time with her family in the meantime.   All questions/concerns addressed. Emotional support provided. Discussed with RN. Discussed with Dr. Sharon Seller.   Exam: Alert, oriented although forgetful about events that lead to hospitalization and events of hospitalization at times. No distress. Breathing regular, unlabored. Weak cough - limited by pain. Abd soft. Generalized weakness and fatigue.   Plan: - DNR/DNI - Pain chronic back r/t cancer:  - OxyCONTIN 10 mg every 12 hours  - OxyIR 5-10 mg every 4 hours PRN - Nausea: Continue Zyprexa 2.5 mg  at bedtime.  - Bowel Regimen: Miralax daily. PRN senokot.  - Dizziness: PRN Meclizine.   50 min  Yong Channel, NP Palliative Medicine Team Pager [phone] (Please see [email or web address] for schedule) Team Phone [phone]    Greater than 50%  of this time was spent counseling and coordinating care related to the above assessment and plan

## 2023-06-06 NOTE — Progress Notes (Signed)
Speech Language Pathology Treatment: Dysphagia  Patient Details Name: Virginia [name] MRN: [id number] DOB: 1956-07-18 Today's Date: 06/06/2023 Time: 6962-9528 SLP Time Calculation (min) (ACUTE ONLY): 17 min  Assessment / Plan / Recommendation Clinical Impression  Pt alert with family at bedside and showing improvements in her swallow ability this morning. She denied odonophagia. She showed swift oral transition of chocolate pudding and mastication of solid texture was timely and thorough without residual. Multiple trials and straw sips thin were observed throughout session without any s/s aspiration and vocal quality remained clear. Pt stated her swallow felt normal. Pt's spouse states he cuts her meat up for her due to prior left sided weakness. Recommend regular texture, thin liquids, meds whole in puree initially then transitioning to pills with thin. No further ST needed at this time.    HPI HPI: Virginia [name] is a 67 y.o. with past medical history significant for metastatic lung cancer, cranio for left cerebellar tumor, prior DVT, HTN, HLD, tobacco abuse, DVT, and anxiety who presented to the ED 9/23 after suffering a witnessed out-of-hospital cardiac arrest.  EP patient received 5 shocks with EMS prior to arrival with 2 rounds of epi.  On ED arrival patient was seen hemodynamically stable but emergently intubated for airway protection. extubated 9/25      SLP Plan  All goals met;Discharge SLP treatment due to (comment)      Recommendations for follow up therapy are one component of a multi-disciplinary discharge planning process, led by the attending physician.  Recommendations may be updated based on patient status, additional functional criteria and insurance authorization.    Recommendations  Diet recommendations: Regular;Thin liquid Liquids provided via: Cup;Straw Medication Administration: Whole meds with puree Supervision: Staff to assist with self feeding;Intermittent  supervision to cue for compensatory strategies Compensations: Slow rate;Small sips/bites Postural Changes and/or Swallow Maneuvers: Seated upright 90 degrees                  Oral care BID   None Dysphagia, unspecified (R13.10)     All goals met;Discharge SLP treatment due to (comment)     Royce Macadamia  06/06/2023, 9:46 AM

## 2023-06-06 NOTE — Evaluation (Signed)
Occupational Therapy Evaluation Patient Details Name: Virginia [name] MRN: [id number] DOB: 08/31/1956 Today's Date: 06/06/2023   History of Present Illness Virginia [name] is a 67 y.o. presented to the ED 9/23 after suffering a witnessed out-of-hospital cardiac arrest.  EP patient received 5 shocks with EMS prior to arrival with 2 rounds of epi.  On ED arrival patient was seen hemodynamically stable but emergently intubated for airway protection; extubated 9/25.EEG: severe diffuse encephalopathy. HMx: metastatic lung cancer, prior DVT, HTN, HLD, tobacco abuse, and anxiety  craniotomy and resection of tumor from L cerebellum.   Clinical Impression   This 67 yo female admitted with above presents to acute OT with PLOF of being able to ambulate with RW and rollator with husband with her to A prn and do 1/2 of her ADLs with him with her as well. Currently she is min A-total A for basic ADLs and Mod A-Max A +2 for mobility. She will continue to benefit from acute OT with follow up from intensive inpatient follow up therapy, >3 hours/day        If plan is discharge home, recommend the following: Two people to help with walking and/or transfers;A lot of help with bathing/dressing/bathroom;Assist for transportation;Direct supervision/assist for medications management;Direct supervision/assist for financial management;Help with stairs or ramp for entrance    Functional Status Assessment  Patient has had a recent decline in their functional status and demonstrates the ability to make significant improvements in function in a reasonable and predictable amount of time.  Equipment Recommendations  Other (comment) (TBD next venue)    Recommendations for Other Services Rehab consult     Precautions / Restrictions Precautions Precautions: Fall Restrictions Weight Bearing Restrictions: No      Mobility Bed Mobility Overal bed mobility: Needs Assistance Bed Mobility: Supine to Sit, Rolling,  Sidelying to Sit Rolling: Max assist, +2 for physical assistance Sidelying to sit: Max assist, +2 for physical assistance       General bed mobility comments: A with legs, to reach with RUE for left rail and to push up to sit    Transfers Overall transfer level: Needs assistance Equipment used: 2 person hand held assist Transfers: Sit to/from Stand, Bed to chair/wheelchair/BSC Sit to Stand: Mod assist, +2 physical assistance Stand pivot transfers: Mod assist, +2 physical assistance                Balance Overall balance assessment: Needs assistance Sitting-balance support: Bilateral upper extremity supported, Feet supported Sitting balance-Leahy Scale: Poor   Postural control: Left lateral lean Standing balance support: Bilateral upper extremity supported Standing balance-Leahy Scale: Poor Standing balance comment: Bil HHA with left lean                           ADL either performed or assessed with clinical judgement   ADL Overall ADL's : Needs assistance/impaired Eating/Feeding: Minimal assistance Eating/Feeding Details (indicate cue type and reason): supported sitting Grooming: Moderate assistance Grooming Details (indicate cue type and reason): supported sitting Upper Body Bathing: Moderate assistance Upper Body Bathing Details (indicate cue type and reason): supported sitting Lower Body Bathing: Maximal assistance Lower Body Bathing Details (indicate cue type and reason): Mod A +2 sit<>stand Upper Body Dressing : Moderate assistance Upper Body Dressing Details (indicate cue type and reason): supported sitting Lower Body Dressing: Total assistance Lower Body Dressing Details (indicate cue type and reason): Mod A +2 sit<>stand Toilet Transfer: Moderate assistance;+2 for physical assistance;Stand-pivot Statistician Details (  indicate cue type and reason): simulated bed>recliner on her right Toileting- Clothing Manipulation and Hygiene: Total  assistance Toileting - Clothing Manipulation Details (indicate cue type and reason): Mod A +2 sit<>stand             Vision Baseline Vision/History: 0 No visual deficits Ability to See in Adequate Light: 0 Adequate Additional Comments: In sittin g she tends to keep her head tilted to right but can bring to midline when asked            Pertinent Vitals/Pain Pain Assessment Pain Assessment: Faces Faces Pain Scale: Hurts little more Pain Location: low back (chronic) Pain Descriptors / Indicators: Sore, Aching Pain Intervention(s): Limited activity within patient's tolerance     Extremity/Trunk Assessment Upper Extremity Assessment Upper Extremity Assessment: Right hand dominant;LUE deficits/detail LUE Deficits / Details: old deficits since brain surgery--has movement in all joints just not normal and mostly 2/5 strength LUE Coordination: decreased gross motor;decreased fine motor           Communication Communication Communication: No apparent difficulties   Cognition Arousal: Alert Behavior During Therapy: WFL for tasks assessed/performed                                                    Home Living Family/patient expects to be discharged to:: Private residence Living Arrangements: Spouse/significant other Available Help at Discharge: Family;Available 24 hours/day Type of Home: House Home Access: Stairs to enter [employer] of Steps: 5 Entrance Stairs-Rails: Left Home Layout: Two level;Bed/bath upstairs;1/2 bath on main level Alternate Level Stairs-Number of Steps: 18 Alternate Level Stairs-Rails: Right Bathroom Shower/Tub: Producer, television/film/video: Handicapped height     Home Equipment: Rollator (4 wheels);Wheelchair - Biomedical scientist (2 wheels);BSC/3in1   Additional Comments: considering getting a stair lift or moving to a more accessible home      Prior Functioning/Environment Prior Level of  Function : Needs assist       Physical Assist : Mobility (physical);ADLs (physical) Mobility (physical): Stairs;Gait ADLs (physical): Bathing;Dressing;Toileting;Grooming Mobility Comments: uses rollator for mobility (downstairs) and RW upstairs. spouse recently assisting with stair mgmt ADLs Comments: uses RW to bathroom upstairs, able to toilet and dress self, some assist with showers        OT Problem List: Decreased strength;Decreased range of motion;Impaired balance (sitting and/or standing);Decreased coordination;Decreased cognition;Decreased safety awareness;Impaired tone;Impaired UE functional use;Pain      OT Treatment/Interventions: Self-care/ADL training;DME and/or AE instruction;Balance training;Patient/family education;Therapeutic exercise;Therapeutic activities;Neuromuscular education    OT Goals(Current goals can be found in the care plan section) Acute Rehab OT Goals Patient Stated Goal: hopeful for inpatient rehab before home OT Goal Formulation: With patient/family Time For Goal Achievement: 06/20/23 Potential to Achieve Goals: Good  OT Frequency: Min 1X/week    Co-evaluation PT/OT/SLP Co-Evaluation/Treatment: Yes Reason for Co-Treatment: For patient/therapist safety;To address functional/ADL transfers PT goals addressed during session: Mobility/safety with mobility;Balance;Strengthening/ROM OT goals addressed during session: Strengthening/ROM;ADL's and self-care      AM-PAC OT "6 Clicks" Daily Activity     Outcome Measure Help from another person eating meals?: A Little Help from another person taking care of personal grooming?: A Little Help from another person toileting, which includes using toliet, bedpan, or urinal?: A Lot Help from another person bathing (including washing, rinsing, drying)?: A Lot Help from another person to put on and  taking off regular upper body clothing?: A Lot Help from another person to put on and taking off regular lower body  clothing?: Total 6 Click Score: 13   End of Session Equipment Utilized During Treatment: Gait belt Nurse Communication: Mobility status;Need for lift equipment (stedy for back to bed)  Activity Tolerance: Patient tolerated treatment well Patient left: in chair;with call bell/phone within reach;with chair alarm set;with family/visitor present  OT Visit Diagnosis: Unsteadiness on feet (R26.81);Other abnormalities of gait and mobility (R26.89);Muscle weakness (generalized) (M62.81);Other symptoms and signs involving cognitive function;Hemiplegia and hemiparesis;Pain Symptoms and signs involving cognitive functions:  (h/o of brain tumor resection) Hemiplegia - Right/Left: Left Hemiplegia - dominant/non-dominant: Non-Dominant Hemiplegia - caused by:  (h/o of brain tumor resection) Pain - part of body:  (lower back)                Time: 4010-2725 OT Time Calculation (min): 26 min Charges:  OT General Charges $OT Visit: 1 Visit OT Evaluation $OT Eval Moderate Complexity: 1 Mod Cathy L. OT Acute Rehabilitation Services Office [phone]    Evette Georges 06/06/2023, 10:47 AM

## 2023-06-07 ENCOUNTER — Inpatient Hospital Stay: Payer: Medicare Other | Admitting: Hematology and Oncology

## 2023-06-07 ENCOUNTER — Inpatient Hospital Stay: Payer: Medicare Other | Admitting: Dietician

## 2023-06-07 ENCOUNTER — Ambulatory Visit: Payer: Medicare Other | Admitting: Physician Assistant

## 2023-06-07 ENCOUNTER — Other Ambulatory Visit: Payer: Self-pay

## 2023-06-07 ENCOUNTER — Other Ambulatory Visit: Payer: Medicare Other

## 2023-06-07 ENCOUNTER — Ambulatory Visit: Payer: Medicare Other

## 2023-06-07 ENCOUNTER — Inpatient Hospital Stay: Payer: Medicare Other

## 2023-06-07 DIAGNOSIS — Z515 Encounter for palliative care: Secondary | ICD-10-CM | POA: Diagnosis not present

## 2023-06-07 DIAGNOSIS — I469 Cardiac arrest, cause unspecified: Secondary | ICD-10-CM | POA: Diagnosis not present

## 2023-06-07 DIAGNOSIS — Z7189 Other specified counseling: Secondary | ICD-10-CM | POA: Diagnosis not present

## 2023-06-07 LAB — GLUCOSE, CAPILLARY
Glucose-Capillary: 119 mg/dL — ABNORMAL HIGH (ref 70–99)
Glucose-Capillary: 203 mg/dL — ABNORMAL HIGH (ref 70–99)
Glucose-Capillary: 168 mg/dL — ABNORMAL HIGH (ref 70–99)

## 2023-06-07 MED ORDER — AMIODARONE HCL 200 MG PO TABS: 400.0000 mg | ORAL_TABLET | Freq: Every day | ORAL | Status: DC

## 2023-06-07 MED ORDER — AMIODARONE HCL 200 MG PO TABS
400.0000 mg | ORAL_TABLET | Freq: Two times a day (BID) | ORAL | Status: AC
  Administered 2023-06-07 – 2023-06-10 (×6): 400 mg via ORAL
  Filled 2023-06-07 (×6): qty 2

## 2023-06-07 MED ORDER — AMIODARONE HCL 200 MG PO TABS: 200.0000 mg | ORAL_TABLET | Freq: Every day | ORAL | Status: DC

## 2023-06-07 MED ORDER — AMOXICILLIN-POT CLAVULANATE 875-125 MG PO TABS
1.0000 | ORAL_TABLET | Freq: Two times a day (BID) | ORAL | Status: AC
  Administered 2023-06-07 – 2023-06-09 (×6): 1 via ORAL
  Filled 2023-06-07 (×6): qty 1

## 2023-06-07 MED ORDER — PANTOPRAZOLE SODIUM 40 MG PO TBEC
40.0000 mg | DELAYED_RELEASE_TABLET | Freq: Every day | ORAL | Status: DC
  Administered 2023-06-07 – 2023-06-10 (×4): 40 mg via ORAL
  Filled 2023-06-07 (×4): qty 1

## 2023-06-07 NOTE — Progress Notes (Signed)
Virginia [name]  WUJ:[id number] DOB: 1956/07/09 DOA: 06/03/2023 PCP: [name], MD    Brief Narrative:  67 year old with a history of metastatic lung cancer with lesions of the brain, DVT, HTN, HLD, tobacco abuse, and anxiety disorder who presented to the ED 9/23 following a witnessed out-of-hospital cardiac arrest.  She received 5 shocks via EMS for reported VF prior to her arrival and 2 rounds of epinephrine.  On ED arrival she was actually hemodynamically stable but was emergently intubated for airway protection.  Family reported concern that her arrest may have been preceded by seizure activity.  She was initially admitted to the ICU and cared for by PCCM.  She was able to be rapidly weaned from the ventilator and extubated 06/05/2023 at 08:28.  To the surprise and delight of the medical team she appeared to be mentally intact with no focal deficits following extubation.  Goals of Care:   Code Status: Limited: Do not attempt resuscitation (DNR) -DNR-LIMITED -Do Not Intubate/DNI    DVT prophylaxis: Place and maintain sequential compression device Start: 06/06/23 0952 SCDs Start: 06/03/23 1618   Interim Hx: No acute events recorded overnight.  Afebrile.  Vital signs stable.  Resting comfortably in bed and in good spirits.  Is motivated to participate in rehab.  Has no new complaints today.  Denies shortness of breath.  Ongoing chest pain especially when coughing as to be expected with broken ribs.  Assessment & Plan:  Out-of-hospital V-fib cardiac arrest Underwent shock x 5 and epi x 2 - family suggest arrest preceded by possible seizure described as rhoncerous upper airway sounds/grunting, but not tonic/clonic type motion - now successfully extubated and appears remarkably stable - TTE notes diminished EF at 45-50% with global hypokinesis -will ask Cardiology to comment on long-term antiarrhythmic use though given her comorbidities I do not suspect she is a candidate for any kind of  device or other invasive intervention   Postarrest acute lung injury - postarrest acute aspiration pneumonitis Care initially provided by PCCM -respiratory status appears stable with patient now successfully extubated - cont to wean O2 to RA   Possible seizure Family reported sx they interpreted as a seizure preceding her cardiac arrest -she would certainly be at risk for this with multiple brain mets - discussed w/ Neurology who report Keppra is an appropriate choice for AED in this situation - EEG 9/23 suggestive of severe diffuse encephalopathy with no seizure or epileptiform discharges noted - no active seizure activity appreciated since hospitalization - it is certainly possible that rather than seizure she was experiencing arrhythmia related syncope leading to her respiratory irregularities though she of course would be at risk for seizure activity as well - plan to continue Keppra indefinitely   Multiple bilateral rib fractures As noted on CT chest 9/23 - due to CPR - pain management  Postarrest encephalopathy Mental status appears to be rapidly improving  Age-indeterminate DVT of the left posterior tibial veins Noted on venous duplex 9/25 -has a prior history of brain bleed dating to 2021 (as noted on MRI) while on Eliquis therapy -known history of left lower extremity DVT 11/09/2019 -given the patient has no acute symptoms to suggest that the clot noted on our current study is acute and given that this is the location of her known prior clot and with her history of bleeding into the brain I do not feel that anticoagulation is appropriate  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 [name] and Dr. Barbaraann Cao - I have added them both to her inpatient care team and made them aware of her admissoin - she was scheduled for tx 9/27 but this has been canceled - she will f/u w/ Dr. [name] to reschedule shortly after she is able to be d/c home    Normocytic anemia Hemoglobin has been on a steady downward trend since admission - no obvious source of blood loss, but is at risk for occult bleeding into the chest cavity - f/u Hgb today is pending   Hypokalemia Supplement to goal of 4.0 and follow  Family Communication: Spoke with husband and children at bedside  Disposition: awaiting bed in CIR    Objective: Blood pressure 110/77, pulse 63, temperature 97.6 F (36.4 C), temperature source Oral, resp. rate 16, height 5\' 2"  (1.575 m), weight 67.1 kg, SpO2 98%.  Intake/Output Summary (Last 24 hours) at 06/07/2023 0919 Last data filed at 06/07/2023 0746 Gross per 24 hour  Intake 239.61 ml  Output 1050 ml  Net -810.39 ml   Filed Weights   06/05/23 2251 06/06/23 0412 06/07/23 0400  Weight: 67.1 kg 67.1 kg 67.1 kg    Examination: General: No acute respiratory distress Lungs: Clear to auscultation bilaterally - no wheezing  Cardiovascular: Regular rate and rhythm Abdomen: Nontender, nondistended, soft, bowel sounds positive, no rebound, no ascites, no appreciable mass Extremities: No significant edema bilateral lower extremities  CBC: Recent Labs  Lab 06/03/23 1453 06/03/23 1618 06/04/23 0706 06/05/23 0311 06/06/23 0436  WBC 15.1*   < > 12.4* 9.3 10.0  NEUTROABS 10.4*  --   --   --   --   HGB 10.2*   < > 11.6* 9.0* 7.6*  HCT 31.7*   < > 32.8* 25.3* 21.9*  MCV 100.6*   < > 93.2 95.1 98.2  PLT 144*   < > 208 193 198   < > = values in this interval not displayed.   Basic Metabolic Panel: Recent Labs  Lab 06/04/23 0421 06/04/23 0706 06/05/23 0311 06/06/23 0436  NA  --  138 141 140  K  --  3.3* 3.1* 3.3*  CL  --  101 105 107  CO2  --  23 26 26   GLUCOSE  --  85 201* 111*  BUN  --  44* 41* 31*  CREATININE  --  1.33* 1.20* 1.25*  CALCIUM  --  7.5* 7.4* 7.4*  MG 1.4*  --  2.7* 2.3  PHOS 5.0*  --   --   --    GFR: Estimated Creatinine Clearance: 40.3 mL/min (A) (by C-G formula based on SCr of 1.25 mg/dL  (H)).   Scheduled Meds:  Chlorhexidine Gluconate Cloth  6 each Topical Daily   dexamethasone  2 mg Oral Daily   Followed by   Melene Muller ON 06/14/2023] dexamethasone  1 mg Oral Daily   docusate sodium  100 mg Oral BID   famotidine  20 mg Oral Daily   feeding supplement  237 mL Oral BID BM   levETIRAcetam  1,000 mg Oral BID   OLANZapine  2.5 mg Oral QHS   mouth rinse  15 mL Mouth Rinse 4 times per day   oxyCODONE  10 mg Oral Q12H   polyethylene glycol  17 g Oral Daily   Continuous Infusions:  amiodarone 30 mg/hr (06/07/23 0131)   cefTRIAXone (ROCEPHIN)  IV Stopped (06/06/23 0850)     LOS: 4 days   Lonia Blood, MD [facility] Office  [phone] Pager - Text Page per Loretha Stapler  If 7PM-7AM, please contact night-coverage per Amion 06/07/2023, 9:19 AM

## 2023-06-07 NOTE — Consult Note (Addendum)
I diastolic dysfunction (impaired relaxation). Right Ventricle: The right ventricular size is normal. No increase in right ventricular wall thickness. Right ventricular systolic function is normal. Left Atrium: Left atrial size was normal in size. Right Atrium: Right atrial size was normal in size. Pericardium: A small pericardial effusion is present. The pericardial effusion is circumferential. Mitral Valve: The  mitral valve is normal in structure. Mild to moderate mitral annular calcification. Trivial mitral valve regurgitation. No evidence of mitral valve stenosis. Tricuspid Valve: The tricuspid valve is normal in structure. Tricuspid valve regurgitation is trivial. No evidence of tricuspid stenosis. Aortic Valve: The aortic valve is normal in structure. Aortic valve regurgitation is mild to moderate. No aortic stenosis is present. Pulmonic Valve: The pulmonic valve was normal in structure. Pulmonic valve regurgitation is trivial. No evidence of pulmonic stenosis. Aorta: There is borderline dilatation of the ascending aorta, measuring 38 mm. Venous: The inferior vena cava is normal in size with greater than 50% respiratory variability, suggesting right atrial pressure of 3 mmHg. IAS/Shunts: No atrial level shunt detected by color flow Doppler.  LEFT VENTRICLE PLAX 2D LVIDd:         4.10 cm   Diastology LVIDs:         3.10 cm   LV e' medial:    4.24 cm/s LV PW:         1.30 cm   LV E/e' medial:  20.6 LV IVS:        1.30 cm   LV e' lateral:   5.87 cm/s LVOT diam:     2.95 cm   LV E/e' lateral: 14.9 LV SV:         90 LV SV Index:   53 LVOT Area:     6.83 cm  RIGHT VENTRICLE             IVC RV S prime:     12.90 cm/s  IVC diam: 1.30 cm TAPSE (M-mode): 2.0 cm LEFT ATRIUM           Index LA diam:      2.00 cm 1.19 cm/m LA Vol (A4C): 33.6 ml 19.98 ml/m  AORTIC VALVE LVOT Vmax:   67.50 cm/s LVOT Vmean:  50.200 cm/s LVOT VTI:    0.131 m  AORTA Ao Root diam: 3.40 cm Ao Asc diam:  3.80 cm MITRAL VALVE MV Area (PHT): 4.29 cm     SHUNTS MV Decel Time: 177 msec     Systemic VTI:  0.13 m MV E velocity: 87.50 cm/s   Systemic Diam: 2.95 cm MV A velocity: 104.00 cm/s MV E/A ratio:  0.84 Kardie Tobb DO Electronically signed by Thomasene Ripple DO Signature Date/Time: 06/06/2023/2:02:10 PM    Final    VAS [name] LOWER EXTREMITY VENOUS (DVT)  Result Date: 06/05/2023  Lower Venous DVT Study Patient Name:  Virginia [name]  Date of Exam:    06/05/2023 Medical Rec #: [id number]          Accession #:    [id number] Date of Birth: 1955/10/30         Patient Gender: F Patient Age:   67 years Exam Location:  [facility] Procedure:      VAS [name] LOWER EXTREMITY VENOUS (DVT) Referring Phys: Levon Hedger --------------------------------------------------------------------------------  Indications: Edema.  Risk Factors: Immobility. Comparison Study: New findings seen since previous study 12/25/22. Performing Technologist: Shona Simpson  Examination Guidelines: A complete evaluation includes B-mode imaging, spectral Doppler, color Doppler, and power Doppler as needed of  ANIONGAP  --  14 10 7     Recent Labs  Lab 06/03/23 1453 06/04/23 0706  PROT 4.2* 5.6*  ALBUMIN 2.0* 2.5*  AST 252* 120*  ALT 237* 244*  ALKPHOS 99 101  BILITOT 0.5 0.6   Lipids  Recent Labs  Lab 06/05/23 0311  TRIG 134    Hematology Recent Labs  Lab 06/04/23 0706 06/05/23 0311 06/06/23 0436  WBC 12.4* 9.3 10.0  RBC 3.52* 2.66* 2.23*  HGB 11.6* 9.0* 7.6*  HCT 32.8* 25.3* 21.9*  MCV 93.2 95.1 98.2  MCH 33.0 33.8 34.1*  MCHC 35.4 35.6 34.7  RDW 15.4 15.8* 16.8*  PLT 208 193 198   Thyroid No results for input(s): "TSH", "FREET4" in the last 168 hours.  BNPNo results for input(s): "BNP", "PROBNP" in the last 168 hours.  DDimer No results for input(s): "DDIMER" in the last 168 hours.   Radiology/Studies:  ECHOCARDIOGRAM COMPLETE  Result Date: 06/06/2023    ECHOCARDIOGRAM REPORT   Patient Name:   Virginia [name] Date of Exam: 06/06/2023 Medical Rec #:  [id number]         Height:       62.0 in Accession #:    [id number]        Weight:       147.9 lb Date of Birth:  Jul 08, 1956        BSA:          1.682 m Patient Age:    67 years          BP:           123/75 mmHg Patient Gender: F                 HR:           76 bpm. Exam Location:  Inpatient Procedure: 2D Echo, Color Doppler, Cardiac Doppler and Intracardiac            Opacification Agent Indications:    Cardiac Arrest I46.9  History:        Patient has prior history of Echocardiogram  examinations, most                 recent 11/17/2021. Risk Factors:Hypertension.  Sonographer:    Harriette Bouillon RDCS Referring Phys: [id number] Vinnie Level SMITH IMPRESSIONS  1. Left ventricular ejection fraction, by estimation, is 45 to 50%. The left ventricle has mildly decreased function. The left ventricle demonstrates global hypokinesis. Left ventricular diastolic parameters are consistent with Grade I diastolic dysfunction (impaired relaxation).  2. Right ventricular systolic function is normal. The right ventricular size is normal.  3. A small pericardial effusion is present. The pericardial effusion is circumferential.  4. The mitral valve is normal in structure. Trivial mitral valve regurgitation. No evidence of mitral stenosis.  5. The aortic valve is normal in structure. Aortic valve regurgitation is mild to moderate. No aortic stenosis is present.  6. There is borderline dilatation of the ascending aorta, measuring 38 mm.  7. The inferior vena cava is normal in size with greater than 50% respiratory variability, suggesting right atrial pressure of 3 mmHg. FINDINGS  Left Ventricle: Left ventricular ejection fraction, by estimation, is 45 to 50%. The left ventricle has mildly decreased function. The left ventricle demonstrates global hypokinesis. Definity contrast agent was given IV to delineate the left ventricular  endocardial borders. The left ventricular internal cavity size was normal in size. There is no left ventricular hypertrophy. Left ventricular diastolic parameters are consistent with Grade  Cardiology Consultation   Patient ID: Virginia [name] MRN: [id number]; DOB: [birth year]  Admit date: 06/03/2023 Date of Consult: 06/07/2023  PCP:  [name], MD   [facility] Providers Cardiologist:  New Click here to update MD or APP on Care Team, Refresh:1}     Patient Profile:   Virginia [name] is a 67 y.o. 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 Dolphus Jenny.   History of Present Illness:   Ms. Persico remotely saw Dr. [place] in 2013 for atypical chest pain/abnormal EKG and had nuclear stress test that was normal. Her history is otherwise outlined above. Her father had valve surgery in the past. She has been followed by oncology for several years for her stage IV lung cancer with numerous phases of treatment and ongoing management in the setting of brain mets. Prior echo 11/2021 showed EF 50%. She was recently in the hospital 04/2023 with multifactorial weakness, AKI, symptomatic anemia requiring blood transfusion, ongoing dizziness, intermittent confusion and difficulty with coordination of movement, hematuria. She had palliative care consultation and elected DNR. She has still struggled with weakness and getting around.  The patient has limited recollection/memory difficulty surrounding recent events so history obtained from husband and chart. She had not had any recent chest pain, SOB, palpitations.  On Sunday 06/02/23 husband reports she had an episode of briefly altered mental status while they were eating. He heard a gurgling/coughing sound and she seemed to be looking ahead. He got right in front of her and she met his gaze. He asked if she needed to  throw up and she nodded yes. She threw up then the rest of the day was unremarkable. She did not lose consciousness.  Then on day of admission, 06/03/23, she presented with out of hospital cardiac arrest. She was eating breakfast with her husband, had another episode of gurgling and coughing similar to Sunday's episode. She was able to look at him when he was speaking to her, then he noticed that she no longer seemed to be with it, and became unresponsive. He dialed 911 and began CPR. EMS arrived about 5-10 minutes later. EMS reported a total of 20-25 minutes of CPR and initial rhythm as Vfib. They administered 2 epi, 2 lidocaine, 5 shocks, with ROSC approximately 1410. On ED arrival she was hemodynamically stable but required emergent intubation for airway protection and decreased responsiveness. She was able to be rapidly weaned and extubated 9/25 and was mentally in tact following extubation.  Other issues this admission include postarrest acute lung injury with aspiration pneumonitis, elevated LFTs, multiple rib fractures due to CPR, age indet. DVT of the left posterior tibial vein, recurrent anemia, hypoalbuminemia, hypomagnesemia, hypokalemia. Anticoagulation has been deferred due to h/o brain bleeding while on Eliquis in the past for DVT. hsTroponin 157->1615. CK 368. A1C 7.3, last Cr 1.25 (Ranging 1.4-1.5 by most recent prior values). CT imaging did pick up atherosclerotic calcifications of the aorta and coronaries. Echo shows EF 45-50% with G1DD, small pericardial effusion, mild-moderate AI, borderline dilation of ascending aorta. Labs today have been delayed by staffing shortage. Primary team is managing electrolytes. They have also involved neurology due to concern that  Cardiology Consultation   Patient ID: Virginia [name] MRN: [id number]; DOB: [birth year]  Admit date: 06/03/2023 Date of Consult: 06/07/2023  PCP:  [name], MD   [facility] Providers Cardiologist:  New Click here to update MD or APP on Care Team, Refresh:1}     Patient Profile:   Virginia [name] is a 67 y.o. 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 Dolphus Jenny.   History of Present Illness:   Ms. Persico remotely saw Dr. [place] in 2013 for atypical chest pain/abnormal EKG and had nuclear stress test that was normal. Her history is otherwise outlined above. Her father had valve surgery in the past. She has been followed by oncology for several years for her stage IV lung cancer with numerous phases of treatment and ongoing management in the setting of brain mets. Prior echo 11/2021 showed EF 50%. She was recently in the hospital 04/2023 with multifactorial weakness, AKI, symptomatic anemia requiring blood transfusion, ongoing dizziness, intermittent confusion and difficulty with coordination of movement, hematuria. She had palliative care consultation and elected DNR. She has still struggled with weakness and getting around.  The patient has limited recollection/memory difficulty surrounding recent events so history obtained from husband and chart. She had not had any recent chest pain, SOB, palpitations.  On Sunday 06/02/23 husband reports she had an episode of briefly altered mental status while they were eating. He heard a gurgling/coughing sound and she seemed to be looking ahead. He got right in front of her and she met his gaze. He asked if she needed to  throw up and she nodded yes. She threw up then the rest of the day was unremarkable. She did not lose consciousness.  Then on day of admission, 06/03/23, she presented with out of hospital cardiac arrest. She was eating breakfast with her husband, had another episode of gurgling and coughing similar to Sunday's episode. She was able to look at him when he was speaking to her, then he noticed that she no longer seemed to be with it, and became unresponsive. He dialed 911 and began CPR. EMS arrived about 5-10 minutes later. EMS reported a total of 20-25 minutes of CPR and initial rhythm as Vfib. They administered 2 epi, 2 lidocaine, 5 shocks, with ROSC approximately 1410. On ED arrival she was hemodynamically stable but required emergent intubation for airway protection and decreased responsiveness. She was able to be rapidly weaned and extubated 9/25 and was mentally in tact following extubation.  Other issues this admission include postarrest acute lung injury with aspiration pneumonitis, elevated LFTs, multiple rib fractures due to CPR, age indet. DVT of the left posterior tibial vein, recurrent anemia, hypoalbuminemia, hypomagnesemia, hypokalemia. Anticoagulation has been deferred due to h/o brain bleeding while on Eliquis in the past for DVT. hsTroponin 157->1615. CK 368. A1C 7.3, last Cr 1.25 (Ranging 1.4-1.5 by most recent prior values). CT imaging did pick up atherosclerotic calcifications of the aorta and coronaries. Echo shows EF 45-50% with G1DD, small pericardial effusion, mild-moderate AI, borderline dilation of ascending aorta. Labs today have been delayed by staffing shortage. Primary team is managing electrolytes. They have also involved neurology due to concern that  Cardiology Consultation   Patient ID: Virginia [name] MRN: [id number]; DOB: [birth year]  Admit date: 06/03/2023 Date of Consult: 06/07/2023  PCP:  [name], MD   [facility] Providers Cardiologist:  New Click here to update MD or APP on Care Team, Refresh:1}     Patient Profile:   Virginia [name] is a 67 y.o. 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 Dolphus Jenny.   History of Present Illness:   Ms. Persico remotely saw Dr. [place] in 2013 for atypical chest pain/abnormal EKG and had nuclear stress test that was normal. Her history is otherwise outlined above. Her father had valve surgery in the past. She has been followed by oncology for several years for her stage IV lung cancer with numerous phases of treatment and ongoing management in the setting of brain mets. Prior echo 11/2021 showed EF 50%. She was recently in the hospital 04/2023 with multifactorial weakness, AKI, symptomatic anemia requiring blood transfusion, ongoing dizziness, intermittent confusion and difficulty with coordination of movement, hematuria. She had palliative care consultation and elected DNR. She has still struggled with weakness and getting around.  The patient has limited recollection/memory difficulty surrounding recent events so history obtained from husband and chart. She had not had any recent chest pain, SOB, palpitations.  On Sunday 06/02/23 husband reports she had an episode of briefly altered mental status while they were eating. He heard a gurgling/coughing sound and she seemed to be looking ahead. He got right in front of her and she met his gaze. He asked if she needed to  throw up and she nodded yes. She threw up then the rest of the day was unremarkable. She did not lose consciousness.  Then on day of admission, 06/03/23, she presented with out of hospital cardiac arrest. She was eating breakfast with her husband, had another episode of gurgling and coughing similar to Sunday's episode. She was able to look at him when he was speaking to her, then he noticed that she no longer seemed to be with it, and became unresponsive. He dialed 911 and began CPR. EMS arrived about 5-10 minutes later. EMS reported a total of 20-25 minutes of CPR and initial rhythm as Vfib. They administered 2 epi, 2 lidocaine, 5 shocks, with ROSC approximately 1410. On ED arrival she was hemodynamically stable but required emergent intubation for airway protection and decreased responsiveness. She was able to be rapidly weaned and extubated 9/25 and was mentally in tact following extubation.  Other issues this admission include postarrest acute lung injury with aspiration pneumonitis, elevated LFTs, multiple rib fractures due to CPR, age indet. DVT of the left posterior tibial vein, recurrent anemia, hypoalbuminemia, hypomagnesemia, hypokalemia. Anticoagulation has been deferred due to h/o brain bleeding while on Eliquis in the past for DVT. hsTroponin 157->1615. CK 368. A1C 7.3, last Cr 1.25 (Ranging 1.4-1.5 by most recent prior values). CT imaging did pick up atherosclerotic calcifications of the aorta and coronaries. Echo shows EF 45-50% with G1DD, small pericardial effusion, mild-moderate AI, borderline dilation of ascending aorta. Labs today have been delayed by staffing shortage. Primary team is managing electrolytes. They have also involved neurology due to concern that  ANIONGAP  --  14 10 7     Recent Labs  Lab 06/03/23 1453 06/04/23 0706  PROT 4.2* 5.6*  ALBUMIN 2.0* 2.5*  AST 252* 120*  ALT 237* 244*  ALKPHOS 99 101  BILITOT 0.5 0.6   Lipids  Recent Labs  Lab 06/05/23 0311  TRIG 134    Hematology Recent Labs  Lab 06/04/23 0706 06/05/23 0311 06/06/23 0436  WBC 12.4* 9.3 10.0  RBC 3.52* 2.66* 2.23*  HGB 11.6* 9.0* 7.6*  HCT 32.8* 25.3* 21.9*  MCV 93.2 95.1 98.2  MCH 33.0 33.8 34.1*  MCHC 35.4 35.6 34.7  RDW 15.4 15.8* 16.8*  PLT 208 193 198   Thyroid No results for input(s): "TSH", "FREET4" in the last 168 hours.  BNPNo results for input(s): "BNP", "PROBNP" in the last 168 hours.  DDimer No results for input(s): "DDIMER" in the last 168 hours.   Radiology/Studies:  ECHOCARDIOGRAM COMPLETE  Result Date: 06/06/2023    ECHOCARDIOGRAM REPORT   Patient Name:   Virginia [name] Date of Exam: 06/06/2023 Medical Rec #:  [id number]         Height:       62.0 in Accession #:    [id number]        Weight:       147.9 lb Date of Birth:  Jul 08, 1956        BSA:          1.682 m Patient Age:    67 years          BP:           123/75 mmHg Patient Gender: F                 HR:           76 bpm. Exam Location:  Inpatient Procedure: 2D Echo, Color Doppler, Cardiac Doppler and Intracardiac            Opacification Agent Indications:    Cardiac Arrest I46.9  History:        Patient has prior history of Echocardiogram  examinations, most                 recent 11/17/2021. Risk Factors:Hypertension.  Sonographer:    Harriette Bouillon RDCS Referring Phys: [id number] Vinnie Level SMITH IMPRESSIONS  1. Left ventricular ejection fraction, by estimation, is 45 to 50%. The left ventricle has mildly decreased function. The left ventricle demonstrates global hypokinesis. Left ventricular diastolic parameters are consistent with Grade I diastolic dysfunction (impaired relaxation).  2. Right ventricular systolic function is normal. The right ventricular size is normal.  3. A small pericardial effusion is present. The pericardial effusion is circumferential.  4. The mitral valve is normal in structure. Trivial mitral valve regurgitation. No evidence of mitral stenosis.  5. The aortic valve is normal in structure. Aortic valve regurgitation is mild to moderate. No aortic stenosis is present.  6. There is borderline dilatation of the ascending aorta, measuring 38 mm.  7. The inferior vena cava is normal in size with greater than 50% respiratory variability, suggesting right atrial pressure of 3 mmHg. FINDINGS  Left Ventricle: Left ventricular ejection fraction, by estimation, is 45 to 50%. The left ventricle has mildly decreased function. The left ventricle demonstrates global hypokinesis. Definity contrast agent was given IV to delineate the left ventricular  endocardial borders. The left ventricular internal cavity size was normal in size. There is no left ventricular hypertrophy. Left ventricular diastolic parameters are consistent with Grade  I diastolic dysfunction (impaired relaxation). Right Ventricle: The right ventricular size is normal. No increase in right ventricular wall thickness. Right ventricular systolic function is normal. Left Atrium: Left atrial size was normal in size. Right Atrium: Right atrial size was normal in size. Pericardium: A small pericardial effusion is present. The pericardial effusion is circumferential. Mitral Valve: The  mitral valve is normal in structure. Mild to moderate mitral annular calcification. Trivial mitral valve regurgitation. No evidence of mitral valve stenosis. Tricuspid Valve: The tricuspid valve is normal in structure. Tricuspid valve regurgitation is trivial. No evidence of tricuspid stenosis. Aortic Valve: The aortic valve is normal in structure. Aortic valve regurgitation is mild to moderate. No aortic stenosis is present. Pulmonic Valve: The pulmonic valve was normal in structure. Pulmonic valve regurgitation is trivial. No evidence of pulmonic stenosis. Aorta: There is borderline dilatation of the ascending aorta, measuring 38 mm. Venous: The inferior vena cava is normal in size with greater than 50% respiratory variability, suggesting right atrial pressure of 3 mmHg. IAS/Shunts: No atrial level shunt detected by color flow Doppler.  LEFT VENTRICLE PLAX 2D LVIDd:         4.10 cm   Diastology LVIDs:         3.10 cm   LV e' medial:    4.24 cm/s LV PW:         1.30 cm   LV E/e' medial:  20.6 LV IVS:        1.30 cm   LV e' lateral:   5.87 cm/s LVOT diam:     2.95 cm   LV E/e' lateral: 14.9 LV SV:         90 LV SV Index:   53 LVOT Area:     6.83 cm  RIGHT VENTRICLE             IVC RV S prime:     12.90 cm/s  IVC diam: 1.30 cm TAPSE (M-mode): 2.0 cm LEFT ATRIUM           Index LA diam:      2.00 cm 1.19 cm/m LA Vol (A4C): 33.6 ml 19.98 ml/m  AORTIC VALVE LVOT Vmax:   67.50 cm/s LVOT Vmean:  50.200 cm/s LVOT VTI:    0.131 m  AORTA Ao Root diam: 3.40 cm Ao Asc diam:  3.80 cm MITRAL VALVE MV Area (PHT): 4.29 cm     SHUNTS MV Decel Time: 177 msec     Systemic VTI:  0.13 m MV E velocity: 87.50 cm/s   Systemic Diam: 2.95 cm MV A velocity: 104.00 cm/s MV E/A ratio:  0.84 Kardie Tobb DO Electronically signed by Thomasene Ripple DO Signature Date/Time: 06/06/2023/2:02:10 PM    Final    VAS [name] LOWER EXTREMITY VENOUS (DVT)  Result Date: 06/05/2023  Lower Venous DVT Study Patient Name:  Virginia [name]  Date of Exam:    06/05/2023 Medical Rec #: [id number]          Accession #:    [id number] Date of Birth: 1955/10/30         Patient Gender: F Patient Age:   67 years Exam Location:  [facility] Procedure:      VAS [name] LOWER EXTREMITY VENOUS (DVT) Referring Phys: Levon Hedger --------------------------------------------------------------------------------  Indications: Edema.  Risk Factors: Immobility. Comparison Study: New findings seen since previous study 12/25/22. Performing Technologist: Shona Simpson  Examination Guidelines: A complete evaluation includes B-mode imaging, spectral Doppler, color Doppler, and power Doppler as needed of  ANIONGAP  --  14 10 7     Recent Labs  Lab 06/03/23 1453 06/04/23 0706  PROT 4.2* 5.6*  ALBUMIN 2.0* 2.5*  AST 252* 120*  ALT 237* 244*  ALKPHOS 99 101  BILITOT 0.5 0.6   Lipids  Recent Labs  Lab 06/05/23 0311  TRIG 134    Hematology Recent Labs  Lab 06/04/23 0706 06/05/23 0311 06/06/23 0436  WBC 12.4* 9.3 10.0  RBC 3.52* 2.66* 2.23*  HGB 11.6* 9.0* 7.6*  HCT 32.8* 25.3* 21.9*  MCV 93.2 95.1 98.2  MCH 33.0 33.8 34.1*  MCHC 35.4 35.6 34.7  RDW 15.4 15.8* 16.8*  PLT 208 193 198   Thyroid No results for input(s): "TSH", "FREET4" in the last 168 hours.  BNPNo results for input(s): "BNP", "PROBNP" in the last 168 hours.  DDimer No results for input(s): "DDIMER" in the last 168 hours.   Radiology/Studies:  ECHOCARDIOGRAM COMPLETE  Result Date: 06/06/2023    ECHOCARDIOGRAM REPORT   Patient Name:   Virginia [name] Date of Exam: 06/06/2023 Medical Rec #:  [id number]         Height:       62.0 in Accession #:    [id number]        Weight:       147.9 lb Date of Birth:  Jul 08, 1956        BSA:          1.682 m Patient Age:    67 years          BP:           123/75 mmHg Patient Gender: F                 HR:           76 bpm. Exam Location:  Inpatient Procedure: 2D Echo, Color Doppler, Cardiac Doppler and Intracardiac            Opacification Agent Indications:    Cardiac Arrest I46.9  History:        Patient has prior history of Echocardiogram  examinations, most                 recent 11/17/2021. Risk Factors:Hypertension.  Sonographer:    Harriette Bouillon RDCS Referring Phys: [id number] Vinnie Level SMITH IMPRESSIONS  1. Left ventricular ejection fraction, by estimation, is 45 to 50%. The left ventricle has mildly decreased function. The left ventricle demonstrates global hypokinesis. Left ventricular diastolic parameters are consistent with Grade I diastolic dysfunction (impaired relaxation).  2. Right ventricular systolic function is normal. The right ventricular size is normal.  3. A small pericardial effusion is present. The pericardial effusion is circumferential.  4. The mitral valve is normal in structure. Trivial mitral valve regurgitation. No evidence of mitral stenosis.  5. The aortic valve is normal in structure. Aortic valve regurgitation is mild to moderate. No aortic stenosis is present.  6. There is borderline dilatation of the ascending aorta, measuring 38 mm.  7. The inferior vena cava is normal in size with greater than 50% respiratory variability, suggesting right atrial pressure of 3 mmHg. FINDINGS  Left Ventricle: Left ventricular ejection fraction, by estimation, is 45 to 50%. The left ventricle has mildly decreased function. The left ventricle demonstrates global hypokinesis. Definity contrast agent was given IV to delineate the left ventricular  endocardial borders. The left ventricular internal cavity size was normal in size. There is no left ventricular hypertrophy. Left ventricular diastolic parameters are consistent with Grade  ANIONGAP  --  14 10 7     Recent Labs  Lab 06/03/23 1453 06/04/23 0706  PROT 4.2* 5.6*  ALBUMIN 2.0* 2.5*  AST 252* 120*  ALT 237* 244*  ALKPHOS 99 101  BILITOT 0.5 0.6   Lipids  Recent Labs  Lab 06/05/23 0311  TRIG 134    Hematology Recent Labs  Lab 06/04/23 0706 06/05/23 0311 06/06/23 0436  WBC 12.4* 9.3 10.0  RBC 3.52* 2.66* 2.23*  HGB 11.6* 9.0* 7.6*  HCT 32.8* 25.3* 21.9*  MCV 93.2 95.1 98.2  MCH 33.0 33.8 34.1*  MCHC 35.4 35.6 34.7  RDW 15.4 15.8* 16.8*  PLT 208 193 198   Thyroid No results for input(s): "TSH", "FREET4" in the last 168 hours.  BNPNo results for input(s): "BNP", "PROBNP" in the last 168 hours.  DDimer No results for input(s): "DDIMER" in the last 168 hours.   Radiology/Studies:  ECHOCARDIOGRAM COMPLETE  Result Date: 06/06/2023    ECHOCARDIOGRAM REPORT   Patient Name:   Virginia [name] Date of Exam: 06/06/2023 Medical Rec #:  [id number]         Height:       62.0 in Accession #:    [id number]        Weight:       147.9 lb Date of Birth:  Jul 08, 1956        BSA:          1.682 m Patient Age:    67 years          BP:           123/75 mmHg Patient Gender: F                 HR:           76 bpm. Exam Location:  Inpatient Procedure: 2D Echo, Color Doppler, Cardiac Doppler and Intracardiac            Opacification Agent Indications:    Cardiac Arrest I46.9  History:        Patient has prior history of Echocardiogram  examinations, most                 recent 11/17/2021. Risk Factors:Hypertension.  Sonographer:    Harriette Bouillon RDCS Referring Phys: [id number] Vinnie Level SMITH IMPRESSIONS  1. Left ventricular ejection fraction, by estimation, is 45 to 50%. The left ventricle has mildly decreased function. The left ventricle demonstrates global hypokinesis. Left ventricular diastolic parameters are consistent with Grade I diastolic dysfunction (impaired relaxation).  2. Right ventricular systolic function is normal. The right ventricular size is normal.  3. A small pericardial effusion is present. The pericardial effusion is circumferential.  4. The mitral valve is normal in structure. Trivial mitral valve regurgitation. No evidence of mitral stenosis.  5. The aortic valve is normal in structure. Aortic valve regurgitation is mild to moderate. No aortic stenosis is present.  6. There is borderline dilatation of the ascending aorta, measuring 38 mm.  7. The inferior vena cava is normal in size with greater than 50% respiratory variability, suggesting right atrial pressure of 3 mmHg. FINDINGS  Left Ventricle: Left ventricular ejection fraction, by estimation, is 45 to 50%. The left ventricle has mildly decreased function. The left ventricle demonstrates global hypokinesis. Definity contrast agent was given IV to delineate the left ventricular  endocardial borders. The left ventricular internal cavity size was normal in size. There is no left ventricular hypertrophy. Left ventricular diastolic parameters are consistent with Grade  I diastolic dysfunction (impaired relaxation). Right Ventricle: The right ventricular size is normal. No increase in right ventricular wall thickness. Right ventricular systolic function is normal. Left Atrium: Left atrial size was normal in size. Right Atrium: Right atrial size was normal in size. Pericardium: A small pericardial effusion is present. The pericardial effusion is circumferential. Mitral Valve: The  mitral valve is normal in structure. Mild to moderate mitral annular calcification. Trivial mitral valve regurgitation. No evidence of mitral valve stenosis. Tricuspid Valve: The tricuspid valve is normal in structure. Tricuspid valve regurgitation is trivial. No evidence of tricuspid stenosis. Aortic Valve: The aortic valve is normal in structure. Aortic valve regurgitation is mild to moderate. No aortic stenosis is present. Pulmonic Valve: The pulmonic valve was normal in structure. Pulmonic valve regurgitation is trivial. No evidence of pulmonic stenosis. Aorta: There is borderline dilatation of the ascending aorta, measuring 38 mm. Venous: The inferior vena cava is normal in size with greater than 50% respiratory variability, suggesting right atrial pressure of 3 mmHg. IAS/Shunts: No atrial level shunt detected by color flow Doppler.  LEFT VENTRICLE PLAX 2D LVIDd:         4.10 cm   Diastology LVIDs:         3.10 cm   LV e' medial:    4.24 cm/s LV PW:         1.30 cm   LV E/e' medial:  20.6 LV IVS:        1.30 cm   LV e' lateral:   5.87 cm/s LVOT diam:     2.95 cm   LV E/e' lateral: 14.9 LV SV:         90 LV SV Index:   53 LVOT Area:     6.83 cm  RIGHT VENTRICLE             IVC RV S prime:     12.90 cm/s  IVC diam: 1.30 cm TAPSE (M-mode): 2.0 cm LEFT ATRIUM           Index LA diam:      2.00 cm 1.19 cm/m LA Vol (A4C): 33.6 ml 19.98 ml/m  AORTIC VALVE LVOT Vmax:   67.50 cm/s LVOT Vmean:  50.200 cm/s LVOT VTI:    0.131 m  AORTA Ao Root diam: 3.40 cm Ao Asc diam:  3.80 cm MITRAL VALVE MV Area (PHT): 4.29 cm     SHUNTS MV Decel Time: 177 msec     Systemic VTI:  0.13 m MV E velocity: 87.50 cm/s   Systemic Diam: 2.95 cm MV A velocity: 104.00 cm/s MV E/A ratio:  0.84 Kardie Tobb DO Electronically signed by Thomasene Ripple DO Signature Date/Time: 06/06/2023/2:02:10 PM    Final    VAS [name] LOWER EXTREMITY VENOUS (DVT)  Result Date: 06/05/2023  Lower Venous DVT Study Patient Name:  Virginia [name]  Date of Exam:    06/05/2023 Medical Rec #: [id number]          Accession #:    [id number] Date of Birth: 1955/10/30         Patient Gender: F Patient Age:   67 years Exam Location:  [facility] Procedure:      VAS [name] LOWER EXTREMITY VENOUS (DVT) Referring Phys: Levon Hedger --------------------------------------------------------------------------------  Indications: Edema.  Risk Factors: Immobility. Comparison Study: New findings seen since previous study 12/25/22. Performing Technologist: Shona Simpson  Examination Guidelines: A complete evaluation includes B-mode imaging, spectral Doppler, color Doppler, and power Doppler as needed of  I diastolic dysfunction (impaired relaxation). Right Ventricle: The right ventricular size is normal. No increase in right ventricular wall thickness. Right ventricular systolic function is normal. Left Atrium: Left atrial size was normal in size. Right Atrium: Right atrial size was normal in size. Pericardium: A small pericardial effusion is present. The pericardial effusion is circumferential. Mitral Valve: The  mitral valve is normal in structure. Mild to moderate mitral annular calcification. Trivial mitral valve regurgitation. No evidence of mitral valve stenosis. Tricuspid Valve: The tricuspid valve is normal in structure. Tricuspid valve regurgitation is trivial. No evidence of tricuspid stenosis. Aortic Valve: The aortic valve is normal in structure. Aortic valve regurgitation is mild to moderate. No aortic stenosis is present. Pulmonic Valve: The pulmonic valve was normal in structure. Pulmonic valve regurgitation is trivial. No evidence of pulmonic stenosis. Aorta: There is borderline dilatation of the ascending aorta, measuring 38 mm. Venous: The inferior vena cava is normal in size with greater than 50% respiratory variability, suggesting right atrial pressure of 3 mmHg. IAS/Shunts: No atrial level shunt detected by color flow Doppler.  LEFT VENTRICLE PLAX 2D LVIDd:         4.10 cm   Diastology LVIDs:         3.10 cm   LV e' medial:    4.24 cm/s LV PW:         1.30 cm   LV E/e' medial:  20.6 LV IVS:        1.30 cm   LV e' lateral:   5.87 cm/s LVOT diam:     2.95 cm   LV E/e' lateral: 14.9 LV SV:         90 LV SV Index:   53 LVOT Area:     6.83 cm  RIGHT VENTRICLE             IVC RV S prime:     12.90 cm/s  IVC diam: 1.30 cm TAPSE (M-mode): 2.0 cm LEFT ATRIUM           Index LA diam:      2.00 cm 1.19 cm/m LA Vol (A4C): 33.6 ml 19.98 ml/m  AORTIC VALVE LVOT Vmax:   67.50 cm/s LVOT Vmean:  50.200 cm/s LVOT VTI:    0.131 m  AORTA Ao Root diam: 3.40 cm Ao Asc diam:  3.80 cm MITRAL VALVE MV Area (PHT): 4.29 cm     SHUNTS MV Decel Time: 177 msec     Systemic VTI:  0.13 m MV E velocity: 87.50 cm/s   Systemic Diam: 2.95 cm MV A velocity: 104.00 cm/s MV E/A ratio:  0.84 Kardie Tobb DO Electronically signed by Thomasene Ripple DO Signature Date/Time: 06/06/2023/2:02:10 PM    Final    VAS [name] LOWER EXTREMITY VENOUS (DVT)  Result Date: 06/05/2023  Lower Venous DVT Study Patient Name:  Virginia [name]  Date of Exam:    06/05/2023 Medical Rec #: [id number]          Accession #:    [id number] Date of Birth: 1955/10/30         Patient Gender: F Patient Age:   67 years Exam Location:  [facility] Procedure:      VAS [name] LOWER EXTREMITY VENOUS (DVT) Referring Phys: Levon Hedger --------------------------------------------------------------------------------  Indications: Edema.  Risk Factors: Immobility. Comparison Study: New findings seen since previous study 12/25/22. Performing Technologist: Shona Simpson  Examination Guidelines: A complete evaluation includes B-mode imaging, spectral Doppler, color Doppler, and power Doppler as needed of  I diastolic dysfunction (impaired relaxation). Right Ventricle: The right ventricular size is normal. No increase in right ventricular wall thickness. Right ventricular systolic function is normal. Left Atrium: Left atrial size was normal in size. Right Atrium: Right atrial size was normal in size. Pericardium: A small pericardial effusion is present. The pericardial effusion is circumferential. Mitral Valve: The  mitral valve is normal in structure. Mild to moderate mitral annular calcification. Trivial mitral valve regurgitation. No evidence of mitral valve stenosis. Tricuspid Valve: The tricuspid valve is normal in structure. Tricuspid valve regurgitation is trivial. No evidence of tricuspid stenosis. Aortic Valve: The aortic valve is normal in structure. Aortic valve regurgitation is mild to moderate. No aortic stenosis is present. Pulmonic Valve: The pulmonic valve was normal in structure. Pulmonic valve regurgitation is trivial. No evidence of pulmonic stenosis. Aorta: There is borderline dilatation of the ascending aorta, measuring 38 mm. Venous: The inferior vena cava is normal in size with greater than 50% respiratory variability, suggesting right atrial pressure of 3 mmHg. IAS/Shunts: No atrial level shunt detected by color flow Doppler.  LEFT VENTRICLE PLAX 2D LVIDd:         4.10 cm   Diastology LVIDs:         3.10 cm   LV e' medial:    4.24 cm/s LV PW:         1.30 cm   LV E/e' medial:  20.6 LV IVS:        1.30 cm   LV e' lateral:   5.87 cm/s LVOT diam:     2.95 cm   LV E/e' lateral: 14.9 LV SV:         90 LV SV Index:   53 LVOT Area:     6.83 cm  RIGHT VENTRICLE             IVC RV S prime:     12.90 cm/s  IVC diam: 1.30 cm TAPSE (M-mode): 2.0 cm LEFT ATRIUM           Index LA diam:      2.00 cm 1.19 cm/m LA Vol (A4C): 33.6 ml 19.98 ml/m  AORTIC VALVE LVOT Vmax:   67.50 cm/s LVOT Vmean:  50.200 cm/s LVOT VTI:    0.131 m  AORTA Ao Root diam: 3.40 cm Ao Asc diam:  3.80 cm MITRAL VALVE MV Area (PHT): 4.29 cm     SHUNTS MV Decel Time: 177 msec     Systemic VTI:  0.13 m MV E velocity: 87.50 cm/s   Systemic Diam: 2.95 cm MV A velocity: 104.00 cm/s MV E/A ratio:  0.84 Kardie Tobb DO Electronically signed by Thomasene Ripple DO Signature Date/Time: 06/06/2023/2:02:10 PM    Final    VAS [name] LOWER EXTREMITY VENOUS (DVT)  Result Date: 06/05/2023  Lower Venous DVT Study Patient Name:  Virginia [name]  Date of Exam:    06/05/2023 Medical Rec #: [id number]          Accession #:    [id number] Date of Birth: 1955/10/30         Patient Gender: F Patient Age:   67 years Exam Location:  [facility] Procedure:      VAS [name] LOWER EXTREMITY VENOUS (DVT) Referring Phys: Levon Hedger --------------------------------------------------------------------------------  Indications: Edema.  Risk Factors: Immobility. Comparison Study: New findings seen since previous study 12/25/22. Performing Technologist: Shona Simpson  Examination Guidelines: A complete evaluation includes B-mode imaging, spectral Doppler, color Doppler, and power Doppler as needed of  Cardiology Consultation   Patient ID: Virginia [name] MRN: [id number]; DOB: [birth year]  Admit date: 06/03/2023 Date of Consult: 06/07/2023  PCP:  [name], MD   [facility] Providers Cardiologist:  New Click here to update MD or APP on Care Team, Refresh:1}     Patient Profile:   Virginia [name] is a 67 y.o. 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 Dolphus Jenny.   History of Present Illness:   Ms. Persico remotely saw Dr. [place] in 2013 for atypical chest pain/abnormal EKG and had nuclear stress test that was normal. Her history is otherwise outlined above. Her father had valve surgery in the past. She has been followed by oncology for several years for her stage IV lung cancer with numerous phases of treatment and ongoing management in the setting of brain mets. Prior echo 11/2021 showed EF 50%. She was recently in the hospital 04/2023 with multifactorial weakness, AKI, symptomatic anemia requiring blood transfusion, ongoing dizziness, intermittent confusion and difficulty with coordination of movement, hematuria. She had palliative care consultation and elected DNR. She has still struggled with weakness and getting around.  The patient has limited recollection/memory difficulty surrounding recent events so history obtained from husband and chart. She had not had any recent chest pain, SOB, palpitations.  On Sunday 06/02/23 husband reports she had an episode of briefly altered mental status while they were eating. He heard a gurgling/coughing sound and she seemed to be looking ahead. He got right in front of her and she met his gaze. He asked if she needed to  throw up and she nodded yes. She threw up then the rest of the day was unremarkable. She did not lose consciousness.  Then on day of admission, 06/03/23, she presented with out of hospital cardiac arrest. She was eating breakfast with her husband, had another episode of gurgling and coughing similar to Sunday's episode. She was able to look at him when he was speaking to her, then he noticed that she no longer seemed to be with it, and became unresponsive. He dialed 911 and began CPR. EMS arrived about 5-10 minutes later. EMS reported a total of 20-25 minutes of CPR and initial rhythm as Vfib. They administered 2 epi, 2 lidocaine, 5 shocks, with ROSC approximately 1410. On ED arrival she was hemodynamically stable but required emergent intubation for airway protection and decreased responsiveness. She was able to be rapidly weaned and extubated 9/25 and was mentally in tact following extubation.  Other issues this admission include postarrest acute lung injury with aspiration pneumonitis, elevated LFTs, multiple rib fractures due to CPR, age indet. DVT of the left posterior tibial vein, recurrent anemia, hypoalbuminemia, hypomagnesemia, hypokalemia. Anticoagulation has been deferred due to h/o brain bleeding while on Eliquis in the past for DVT. hsTroponin 157->1615. CK 368. A1C 7.3, last Cr 1.25 (Ranging 1.4-1.5 by most recent prior values). CT imaging did pick up atherosclerotic calcifications of the aorta and coronaries. Echo shows EF 45-50% with G1DD, small pericardial effusion, mild-moderate AI, borderline dilation of ascending aorta. Labs today have been delayed by staffing shortage. Primary team is managing electrolytes. They have also involved neurology due to concern that

## 2023-06-07 NOTE — Progress Notes (Signed)
Palliative:  HPI: 67 y.o. female  with past medical history of NSCLC mets to brain and bone since Feb 2021, prior DVT, HTN, HLD, tobacco abuse, anxiety admitted on 06/03/2023 with out of hospital cardiac arrest receiving 5 shocks and 2 rounds of epi by EMS. Intubated in ED for airway protection. Extubated 9/25.   I met today with Virginia [name] and husband, Virginia [name], at bedside. Virginia [name] continues to be in good spirits. She has a stronger cough - still hurts but less painful. She reports that her pain is well controlled on current regimen. She slept well last night. Ate 2 muffins and some fruit for breakfast. Drinking Ensure. They are hopeful for transition to CIR to optimize with goal to return home. Virginia [name] enquires about larger/queen size hospital bed to have in the home. I am not sure that this is available from medical supply and if so I do not know if insurance will cover this (discussed this with [facility]). They are pleased with her progress.   All questions/concerns addressed. Emotional support provided.   Exam: Alert, oriented. Short term memory deficit since her cardiac arrest. No distress. Breathing regular, unlabored. + Cough - stronger cough, less congested. Abd soft. Generalized weakness. Smiling - good spirits.   Plan: - DNR/DNI - Hopeful for CIR No charges to symptom regimen:  - Pain chronic back r/t cancer:             - OxyCONTIN 10 mg every 12 hours             - OxyIR 5-10 mg every 4 hours PRN - Nausea: Continue Zyprexa 2.5 mg at bedtime.  - Bowel Regimen: Miralax daily. PRN senokot.  - Dizziness: PRN Meclizine.    35  min  Yong Channel, NP Palliative Medicine Team Pager [phone] (Please see [email or web address] for schedule) Team Phone [phone]    Greater than 50%  of this time was spent counseling and coordinating care related to the above assessment and plan

## 2023-06-07 NOTE — Progress Notes (Signed)
Inpatient Rehab Admissions Coordinator:    I met with pt. And husband to discuss potential CIR admit. They are interested and husband can provide 24/7 assist at d/c. I will follow for potential admit pending medical readiness.   Megan Salon, MS, CCC-SLP Rehab Admissions Coordinator  [phone] (celll) [phone] (office)

## 2023-06-07 NOTE — Progress Notes (Signed)
Patient's HR trending up into 120s-140s. Patient feels palpitations. BP stable; 127/93. Spoke with Perlie Gold PA, on call for cardiology, received orders to give 2200 dose of PO amiodarone now.

## 2023-06-07 NOTE — Progress Notes (Signed)
EKG was completed. At the time this was obtained, the patient had since gone into atrial flutter vs coarse afib shortly after 4pm. Asymptomatic. BP 111/73. D/w Dr. Lynnette Caffey. HR 106bpm currently. In absence of symptoms, he recommends to continue with plan for transition to oral amiodarone - he feels OK to continue next dose as scheduled at 2200. Not a candidate for anticoagulation given prior brain bleeding while on Eliquis in setting of brain mets.

## 2023-06-07 NOTE — Progress Notes (Signed)
PT Cancellation Note  Patient Details Name: Virginia [name] MRN: [id number] DOB: Apr 17, 1956   Cancelled Treatment:    Reason Eval/Treat Not Completed: Other (comment) (nursing in room trying to get IV line and taking foley out.)   Bevelyn Buckles 06/07/2023, 3:05 PM Tayten Bergdoll M,PT Acute Rehab Services [phone]

## 2023-06-07 NOTE — Progress Notes (Signed)
MEWS Progress Note  Patient Details Name: Virginia [name] MRN: [id number] DOB: August 21, 1956 Today's Date: 06/07/2023   MEWS Flowsheet Documentation:  Assess: MEWS Score Temp: 99.7 F (37.6 C) BP: 111/73 MAP (mmHg): 86 Pulse Rate: (!) 113 ECG Heart Rate: (!) 115 Resp: 18 Level of Consciousness: Alert SpO2: 97 % O2 Device: Nasal Cannula Patient Activity (if Appropriate): In bed O2 Flow Rate (L/min): 2 L/min FiO2 (%): 40 % Assess: MEWS Score MEWS Temp: 0 MEWS Systolic: 0 MEWS Pulse: 2 MEWS RR: 0 MEWS LOC: 0 MEWS Score: 2 MEWS Score Color: Yellow Assess: SIRS CRITERIA SIRS Temperature : 0 SIRS Respirations : 0 SIRS Pulse: 1 SIRS WBC: 0 SIRS Score Sum : 1 SIRS Temperature : 0 SIRS Pulse: 1 SIRS Respirations : 0 SIRS WBC: 0 SIRS Score Sum : 1 Assess: if the MEWS score is Yellow or Red Were vital signs accurate and taken at a resting state?: Yes Does the patient meet 2 or more of the SIRS criteria?: No MEWS guidelines implemented : Yes, yellow Treat MEWS Interventions: Considered administering scheduled or prn medications/treatments as ordered Take Vital Signs Increase Vital Sign Frequency : Yellow: Q2hr x1, continue Q4hrs until patient remains green for 12hrs Escalate MEWS: Escalate: Yellow: Discuss with charge nurse and consider notifying provider and/or RRT Notify: Charge Nurse/RN Name of Charge Nurse/RN Notified: Lindi Adie Provider Notification Provider Name/Title: Ronie Spies; Dr. Lynnette Caffey Date Provider Notified: 06/07/23 Time Provider Notified: 1630 Method of Notification: Page Notification Reason: New onset of dysrhythmia Provider response: No new orders Date of Provider Response: 06/07/23 Time of Provider Response: 1634      [name] 06/07/2023, 4:34 PM

## 2023-06-08 DIAGNOSIS — Z515 Encounter for palliative care: Secondary | ICD-10-CM | POA: Diagnosis not present

## 2023-06-08 DIAGNOSIS — I469 Cardiac arrest, cause unspecified: Secondary | ICD-10-CM | POA: Diagnosis not present

## 2023-06-08 DIAGNOSIS — Z7189 Other specified counseling: Secondary | ICD-10-CM | POA: Diagnosis not present

## 2023-06-08 LAB — GLUCOSE, CAPILLARY
Glucose-Capillary: 176 mg/dL — ABNORMAL HIGH (ref 70–99)
Glucose-Capillary: 269 mg/dL — ABNORMAL HIGH (ref 70–99)

## 2023-06-08 LAB — MAGNESIUM: Magnesium: 1.7 mg/dL (ref 1.7–2.4)

## 2023-06-08 LAB — TSH: TSH: 1.69 u[IU]/mL (ref 0.350–4.500)

## 2023-06-08 LAB — CBC
HCT: 22.8 % — ABNORMAL LOW (ref 36.0–46.0)
Hemoglobin: 7.6 g/dL — ABNORMAL LOW (ref 12.0–15.0)
MCH: 32.5 pg (ref 26.0–34.0)
MCHC: 33.3 g/dL (ref 30.0–36.0)
MCV: 97.4 fL (ref 80.0–100.0)
Platelets: 240 10*3/uL (ref 150–400)
RBC: 2.34 MIL/uL — ABNORMAL LOW (ref 3.87–5.11)
RDW: 16.4 % — ABNORMAL HIGH (ref 11.5–15.5)
WBC: 10.2 10*3/uL (ref 4.0–10.5)
nRBC: 0 % (ref 0.0–0.2)

## 2023-06-08 LAB — COMPREHENSIVE METABOLIC PANEL
ALT: 57 U/L — ABNORMAL HIGH (ref 0–44)
AST: 20 U/L (ref 15–41)
Albumin: 2 g/dL — ABNORMAL LOW (ref 3.5–5.0)
Alkaline Phosphatase: 59 U/L (ref 38–126)
Anion gap: 12 (ref 5–15)
BUN: 28 mg/dL — ABNORMAL HIGH (ref 8–23)
CO2: 29 mmol/L (ref 22–32)
Calcium: 8.6 mg/dL — ABNORMAL LOW (ref 8.9–10.3)
Chloride: 101 mmol/L (ref 98–111)
Creatinine, Ser: 1.15 mg/dL — ABNORMAL HIGH (ref 0.44–1.00)
GFR, Estimated: 53 mL/min — ABNORMAL LOW (ref >60)
Glucose, Bld: 126 mg/dL — ABNORMAL HIGH (ref 70–99)
Potassium: 3.6 mmol/L (ref 3.5–5.1)
Sodium: 142 mmol/L (ref 135–145)
Total Bilirubin: 0.2 mg/dL — ABNORMAL LOW (ref 0.3–1.2)
Total Protein: 4.5 g/dL — ABNORMAL LOW (ref 6.5–8.1)

## 2023-06-08 LAB — D-DIMER, QUANTITATIVE: D-Dimer, Quant: 3.62 ug{FEU}/mL — ABNORMAL HIGH (ref 0.00–0.50)

## 2023-06-08 MED ORDER — POTASSIUM CHLORIDE CRYS ER 10 MEQ PO TBCR
40.0000 meq | EXTENDED_RELEASE_TABLET | Freq: Two times a day (BID) | ORAL | Status: AC
  Administered 2023-06-08 – 2023-06-09 (×3): 40 meq via ORAL
  Filled 2023-06-08 (×2): qty 4

## 2023-06-08 MED ORDER — MAGNESIUM SULFATE 2 GM/50ML IV SOLN
2.0000 g | Freq: Once | INTRAVENOUS | Status: AC
  Administered 2023-06-08: 2 g via INTRAVENOUS
  Filled 2023-06-08: qty 50

## 2023-06-08 MED ORDER — SENNA 8.6 MG PO TABS
1.0000 | ORAL_TABLET | Freq: Two times a day (BID) | ORAL | Status: DC | PRN
  Administered 2023-06-08 (×2): 8.6 mg via ORAL
  Filled 2023-06-08 (×2): qty 1

## 2023-06-08 MED ORDER — BETHANECHOL CHLORIDE 10 MG PO TABS
10.0000 mg | ORAL_TABLET | Freq: Three times a day (TID) | ORAL | Status: DC
  Administered 2023-06-08 – 2023-06-10 (×7): 10 mg via ORAL
  Filled 2023-06-08 (×9): qty 1

## 2023-06-08 NOTE — Plan of Care (Signed)
  Problem: Metabolic: Goal: Ability to maintain appropriate glucose levels will improve Outcome: Progressing   Problem: Skin Integrity: Goal: Risk for impaired skin integrity will decrease Outcome: Progressing   Problem: Health Behavior/Discharge Planning: Goal: Ability to manage health-related needs will improve Outcome: Progressing   Problem: Clinical Measurements: Goal: Ability to maintain clinical measurements within normal limits will improve Outcome: Progressing Goal: Will remain free from infection Outcome: Progressing Goal: Respiratory complications will improve Outcome: Progressing Goal: Cardiovascular complication will be avoided Outcome: Progressing   Problem: Nutrition: Goal: Adequate nutrition will be maintained Outcome: Progressing   Problem: Safety: Goal: Ability to remain free from injury will improve Outcome: Progressing   Problem: Skin Integrity: Goal: Risk for impaired skin integrity will decrease Outcome: Progressing

## 2023-06-08 NOTE — Progress Notes (Signed)
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  She received 5 shocks via EMS for reported VF prior to her arrival and 2 rounds of epinephrine.  On ED arrival she was actually hemodynamically stable but was emergently intubated for airway protection.  Family reported concern that her arrest may have been preceded by seizure activity.  She was initially admitted to the ICU and cared for by PCCM.  She was able to be rapidly weaned from the ventilator and extubated 06/05/2023 at 08:28.  To the surprise and delight of the medical team she appeared to be mentally intact with no focal deficits following extubation.  Goals of Care:   Code Status: Limited: Do not attempt resuscitation (DNR) -DNR-LIMITED -Do Not Intubate/DNI    DVT prophylaxis: Place and maintain sequential compression device Start: 06/06/23 0952 SCDs Start: 06/03/23 1618   Interim Hx: At the time of the patient's cardiology evaluation yesterday she was noted to acutely convert into atrial flutter versus coarse A-fib with controlled heart rate.  She is presently in NSR with heart rate of 76.  Vitals are otherwise stable.  Unfortunately she experienced urinary retention after her Foley catheter, which was placed at time of admission, was discontinued.  In-N-Out catheter had to be utilized to drain her bladder.  She is in good spirits at the time my visit.  She has no new complaints.  She denies chest pain or shortness of breath.  Assessment & Plan:  Out-of-hospital V-fib cardiac arrest Underwent shock x 5 and epi x 2 - family suggest arrest preceded by possible seizure described as rhoncerous upper airway sounds/grunting, but not tonic/clonic type motion - now  successfully extubated and appears remarkably stable - TTE notes diminished EF at 45-50% with global hypokinesis - Cardiology agree she is not a candidate for invasive intervention and suggest continuing amiodarone  Postarrest acute lung injury - postarrest acute aspiration pneumonitis Care initially provided by PCCM -respiratory status appears stable with patient now successfully extubated -successfully weaned to room air  Possible seizure Family reported sx they interpreted as a seizure preceding her cardiac arrest -she would certainly be at risk for this with multiple brain mets - discussed w/ Neurology who report Keppra is an appropriate choice for AED in this situation - EEG 9/23 suggestive of severe diffuse encephalopathy with no seizure or epileptiform discharges noted - no active seizure activity appreciated since hospitalization - it is certainly possible that rather than seizure she was experiencing arrhythmia related syncope leading to her respiratory irregularities though she of course would be at risk for seizure activity as well - plan to continue Keppra indefinitely   Multiple bilateral rib fractures As noted on CT chest 9/23 - due to CPR - pain management  Postarrest encephalopathy Mental status appears to be rapidly improving  Age-indeterminate DVT of the left posterior tibial veins Noted on venous duplex 9/25 -has a prior history of brain bleed dating to 2021 (as noted on MRI) while on Eliquis therapy -known history of left lower extremity DVT 11/09/2019 -given the patient has no acute symptoms to suggest that the clot noted on our current study is acute and given that this is the location of her known prior clot and with her history of  bleeding into the brain I do not feel that anticoagulation is appropriate  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 [name] and Dr. Barbaraann Cao - I have added them both to her inpatient care team and  made them aware of her admissoin - she was scheduled for tx 9/27 but this has been canceled - she will f/u w/ Dr. [name] to reschedule shortly after she is able to be d/c home   Normocytic anemia Hemoglobin had been on a steady downward trend since admission, but has since stabilized- no obvious source of blood loss  Hypokalemia Supplement to goal of 4.0 and follow  Acute urinary retention After having Foley catheter placed while critically ill/intubated -monitor with repeat In-N-Out catheter as needed -Urecholine added for short course  Family Communication: Spoke with husband at bedside Disposition: awaiting bed in CIR    Objective: Blood pressure 124/67, pulse 75, temperature 98.5 F (36.9 C), temperature source Oral, resp. rate 18, height 5\' 2"  (1.575 m), weight 68 kg, SpO2 100%.  Intake/Output Summary (Last 24 hours) at 06/08/2023 0929 Last data filed at 06/08/2023 0649 Gross per 24 hour  Intake --  Output 2650 ml  Net -2650 ml   Filed Weights   06/06/23 0412 06/07/23 0400 06/08/23 0422  Weight: 67.1 kg 67.1 kg 68 kg    Examination: General: No acute respiratory distress Lungs: Clear to auscultation bilaterally  Cardiovascular: Regular rate and rhythm -no appreciable murmur Abdomen: Nontender, nondistended, soft, bowel sounds positive, no rebound Extremities: No significant edema bilateral lower extremities  CBC: Recent Labs  Lab 06/03/23 1453 06/03/23 1618 06/05/23 0311 06/06/23 0436 06/08/23 0422  WBC 15.1*   < > 9.3 10.0 10.2  NEUTROABS 10.4*  --   --   --   --   HGB 10.2*   < > 9.0* 7.6* 7.6*  HCT 31.7*   < > 25.3* 21.9* 22.8*  MCV 100.6*   < > 95.1 98.2 97.4  PLT 144*   < > 193 198 240   < > = values in this interval not displayed.   Basic Metabolic Panel: Recent Labs  Lab 06/04/23 0421 06/04/23 0706 06/05/23 0311 06/06/23 0436 06/08/23 0422  NA  --    < > 141 140 142  K  --    < > 3.1* 3.3* 3.6  CL  --    < > 105 107 101  CO2  --    < > 26 26  29   GLUCOSE  --    < > 201* 111* 126*  BUN  --    < > 41* 31* 28*  CREATININE  --    < > 1.20* 1.25* 1.15*  CALCIUM  --    < > 7.4* 7.4* 8.6*  MG 1.4*  --  2.7* 2.3 1.7  PHOS 5.0*  --   --   --   --    < > = values in this interval not displayed.   GFR: Estimated Creatinine Clearance: 44.1 mL/min (A) (by C-G formula based on SCr of 1.15 mg/dL (H)).   Scheduled Meds:  amiodarone  400 mg Oral BID   Followed by   Melene Muller ON 06/11/2023] amiodarone  400 mg Oral Daily   Followed by   Melene Muller ON 06/18/2023] amiodarone  200 mg Oral Daily   amoxicillin-clavulanate  1 tablet Oral Q12H   bethanechol  10 mg Oral TID   Chlorhexidine Gluconate Cloth  6 each Topical Daily  dexamethasone  2 mg Oral Daily   Followed by   Melene Muller ON 06/14/2023] dexamethasone  1 mg Oral Daily   docusate sodium  100 mg Oral BID   famotidine  20 mg Oral Daily   feeding supplement  237 mL Oral BID BM   levETIRAcetam  1,000 mg Oral BID   OLANZapine  2.5 mg Oral QHS   mouth rinse  15 mL Mouth Rinse 4 times per day   oxyCODONE  10 mg Oral Q12H   pantoprazole  40 mg Oral Daily   polyethylene glycol  17 g Oral Daily     LOS: 5 days   Lonia Blood, MD [facility] Office  [phone] Pager - Text Page per Loretha Stapler  If 7PM-7AM, please contact night-coverage per Amion 06/08/2023, 9:29 AM

## 2023-06-08 NOTE — Progress Notes (Signed)
Palliative:  HPI: 67 y.o. female with past medical history of NSCLC mets to brain and bone since Feb 2021, prior DVT, HTN, HLD, tobacco abuse, anxiety admitted on 06/03/2023 with out of hospital cardiac arrest receiving 5 shocks and 2 rounds of epi by EMS. Intubated in ED for airway protection. Extubated 9/25.   I met today with Ward Chatters and Ashleigh at bedside. Virginia [name] continues to do well overall and in good spirits. Symptoms are controlled. She tells me that she does not want to be DNR. She does desire resuscitation and intubation if needed. She would not want to be left on ventilator long term without improvement. She would not want to be on ventilator past 2 days if in a vegetative state. Harvie Heck shares that he will follow her wishes and do what he needs to do if it comes to that. I was very clear that if she were to require these interventions again it is highly unlikely that she will have a similar outcome. She is accepting of this and if she does not improve she is at peace with "going to heaven." I did encourage that this should be discussed and reconsidered with any decline in her quality of life or decline in overall health as this these interventions are not successful especially if she is already declining. They all express understanding.   I assisted Harvie Heck to help [name] up to [facility]. She did well and Harvie Heck provided most of the assistance - he is well educated on how to assist her as he has been doing this for a long time now. Virginia [name] has been constipated - requested senokot from RN and some urinary retention so hoping that getting her up and moving more may assist with this. Her HR is much better controlled today in 80s. Awaiting CIR placement  All questions/concerns addressed. Emotional support provided.   Exam: Alert, oriented. Short term memory deficit since arrest. No distress. HR 80s. Breathing regular, unlabored. Abd soft. Generalized weakness and fatigue.   Plan: - FULL code - Hopeful  for CIR No changes to symptom regimen:  - Pain chronic back r/t cancer:             - OxyCONTIN 10 mg every 12 hours             - OxyIR 5-10 mg every 4 hours PRN - Nausea: Continue Zyprexa 2.5 mg at bedtime.  - Bowel Regimen: Miralax daily. PRN senokot.  - Dizziness: PRN Meclizine.    40 min  Yong Channel, NP Palliative Medicine Team Pager [phone] (Please see [email or web address] for schedule) Team Phone [phone]    Greater than 50%  of this time was spent counseling and coordinating care related to the above assessment and plan

## 2023-06-08 NOTE — Progress Notes (Signed)
Rounding Note    Patient Name: Virginia [name] Date of Encounter: 06/08/2023  [facility] Cardiologist: None   Subjective   Doing well this AM, family by the bedside Rhythm has been quiescent.   Inpatient Medications    Scheduled Meds:  amiodarone  400 mg Oral BID   Followed by   Melene Muller ON 06/11/2023] amiodarone  400 mg Oral Daily   Followed by   Melene Muller ON 06/18/2023] amiodarone  200 mg Oral Daily   amoxicillin-clavulanate  1 tablet Oral Q12H   bethanechol  10 mg Oral TID   Chlorhexidine Gluconate Cloth  6 each Topical Daily   dexamethasone  2 mg Oral Daily   Followed by   Melene Muller ON 06/14/2023] dexamethasone  1 mg Oral Daily   docusate sodium  100 mg Oral BID   famotidine  20 mg Oral Daily   feeding supplement  237 mL Oral BID BM   levETIRAcetam  1,000 mg Oral BID   OLANZapine  2.5 mg Oral QHS   mouth rinse  15 mL Mouth Rinse 4 times per day   oxyCODONE  10 mg Oral Q12H   pantoprazole  40 mg Oral Daily   polyethylene glycol  17 g Oral Daily   Continuous Infusions:  PRN Meds: acetaminophen, ALPRAZolam, HYDROcodone-acetaminophen, meclizine, ondansetron (ZOFRAN) IV, mouth rinse, oxyCODONE, senna   Vital Signs    Vitals:   06/07/23 1930 06/07/23 2216 06/08/23 0422 06/08/23 0821  BP: 125/81  136/88 124/67  Pulse: (!) 106 84 66 75  Resp: 19  16 18   Temp:   98.2 F (36.8 C) 98.5 F (36.9 C)  TempSrc:   Oral Oral  SpO2: 98% 100% 100% 100%  Weight:   68 kg   Height:        Intake/Output Summary (Last 24 hours) at 06/08/2023 0847 Last data filed at 06/08/2023 0649 Gross per 24 hour  Intake --  Output 2650 ml  Net -2650 ml      06/08/2023    4:22 AM 06/07/2023    4:00 AM 06/06/2023    4:12 AM  Last 3 Weights  Weight (lbs) 150 lb 148 lb 147 lb 14.9 oz  Weight (kg) 68.04 kg 67.132 kg 67.1 kg      Telemetry    NSR - Personally Reviewed  ECG    NO new - Personally Reviewed  Physical Exam   Vitals:   06/08/23 0422 06/08/23 0821  BP:  136/88 124/67  Pulse: 66 75  Resp: 16 18  Temp: 98.2 F (36.8 C) 98.5 F (36.9 C)  SpO2: 100% 100%    GEN: No acute distress.   Neck: No JVD Cardiac: RRR, no murmurs, rubs, or gallops.  Respiratory: Clear to auscultation bilaterally. ON O2 GI: Soft, nontender, non-distended  MS: No edema; No deformity. Neuro:  Nonfocal  Psych: Normal affect   Labs    High Sensitivity Troponin:   Recent Labs  Lab 06/03/23 1453 06/03/23 1844  TROPONINIHS 157* 1,615*     Chemistry Recent Labs  Lab 06/03/23 1453 06/03/23 1618 06/04/23 0706 06/05/23 0311 06/06/23 0436 06/08/23 0422  NA 137   < > 138 141 140 142  K 3.4*   < > 3.3* 3.1* 3.3* 3.6  CL 104  --  101 105 107 101  CO2 21*  --  23 26 26 29   GLUCOSE 288*  --  85 201* 111* 126*  BUN 30*  --  44* 41* 31* 28*  CREATININE 1.04*  --  1.33* 1.20* 1.25* 1.15*  CALCIUM 6.7*  --  7.5* 7.4* 7.4* 8.6*  MG  --    < >  --  2.7* 2.3 1.7  PROT 4.2*  --  5.6*  --   --  4.5*  ALBUMIN 2.0*  --  2.5*  --   --  2.0*  AST 252*  --  120*  --   --  20  ALT 237*  --  244*  --   --  57*  ALKPHOS 99  --  101  --   --  59  BILITOT 0.5  --  0.6  --   --  0.2*  GFRNONAA 60*  --  44* 50* 48* 53*  ANIONGAP 12  --  14 10 7 12    < > = values in this interval not displayed.    Lipids  Recent Labs  Lab 06/05/23 0311  TRIG 134    Hematology Recent Labs  Lab 06/05/23 0311 06/06/23 0436 06/08/23 0422  WBC 9.3 10.0 10.2  RBC 2.66* 2.23* 2.34*  HGB 9.0* 7.6* 7.6*  HCT 25.3* 21.9* 22.8*  MCV 95.1 98.2 97.4  MCH 33.8 34.1* 32.5  MCHC 35.6 34.7 33.3  RDW 15.8* 16.8* 16.4*  PLT 193 198 240   Thyroid  Recent Labs  Lab 06/08/23 0422  TSH 1.690    BNPNo results for input(s): "BNP", "PROBNP" in the last 168 hours.  DDimer  Recent Labs  Lab 06/08/23 0422  DDIMER 3.62*     Radiology    ECHOCARDIOGRAM COMPLETE  Result Date: 06/06/2023    ECHOCARDIOGRAM REPORT   Patient Name:   Virginia [name] Date of Exam: 06/06/2023 Medical Rec #:   [id number]         Height:       62.0 in Accession #:    [id number]        Weight:       147.9 lb Date of Birth:  Jan 22, 1956        BSA:          1.682 m Patient Age:    67 years          BP:           123/75 mmHg Patient Gender: F                 HR:           76 bpm. Exam Location:  Inpatient Procedure: 2D Echo, Color Doppler, Cardiac Doppler and Intracardiac            Opacification Agent Indications:    Cardiac Arrest I46.9  History:        Patient has prior history of Echocardiogram examinations, most                 recent 11/17/2021. Risk Factors:Hypertension.  Sonographer:    Harriette Bouillon RDCS Referring Phys: [id number] Vinnie Level SMITH IMPRESSIONS  1. Left ventricular ejection fraction, by estimation, is 45 to 50%. The left ventricle has mildly decreased function. The left ventricle demonstrates global hypokinesis. Left ventricular diastolic parameters are consistent with Grade I diastolic dysfunction (impaired relaxation).  2. Right ventricular systolic function is normal. The right ventricular size is normal.  3. A small pericardial effusion is present. The pericardial effusion is circumferential.  4. The mitral valve is normal in structure. Trivial mitral valve regurgitation. No evidence of mitral stenosis.  5. The aortic valve is normal in structure. Aortic valve regurgitation is mild  to moderate. No aortic stenosis is present.  6. There is borderline dilatation of the ascending aorta, measuring 38 mm.  7. The inferior vena cava is normal in size with greater than 50% respiratory variability, suggesting right atrial pressure of 3 mmHg. FINDINGS  Left Ventricle: Left ventricular ejection fraction, by estimation, is 45 to 50%. The left ventricle has mildly decreased function. The left ventricle demonstrates global hypokinesis. Definity contrast agent was given IV to delineate the left ventricular  endocardial borders. The left ventricular internal cavity size was normal in size. There is no left ventricular  hypertrophy. Left ventricular diastolic parameters are consistent with Grade I diastolic dysfunction (impaired relaxation). Right Ventricle: The right ventricular size is normal. No increase in right ventricular wall thickness. Right ventricular systolic function is normal. Left Atrium: Left atrial size was normal in size. Right Atrium: Right atrial size was normal in size. Pericardium: A small pericardial effusion is present. The pericardial effusion is circumferential. Mitral Valve: The mitral valve is normal in structure. Mild to moderate mitral annular calcification. Trivial mitral valve regurgitation. No evidence of mitral valve stenosis. Tricuspid Valve: The tricuspid valve is normal in structure. Tricuspid valve regurgitation is trivial. No evidence of tricuspid stenosis. Aortic Valve: The aortic valve is normal in structure. Aortic valve regurgitation is mild to moderate. No aortic stenosis is present. Pulmonic Valve: The pulmonic valve was normal in structure. Pulmonic valve regurgitation is trivial. No evidence of pulmonic stenosis. Aorta: There is borderline dilatation of the ascending aorta, measuring 38 mm. Venous: The inferior vena cava is normal in size with greater than 50% respiratory variability, suggesting right atrial pressure of 3 mmHg. IAS/Shunts: No atrial level shunt detected by color flow Doppler.  LEFT VENTRICLE PLAX 2D LVIDd:         4.10 cm   Diastology LVIDs:         3.10 cm   LV e' medial:    4.24 cm/s LV PW:         1.30 cm   LV E/e' medial:  20.6 LV IVS:        1.30 cm   LV e' lateral:   5.87 cm/s LVOT diam:     2.95 cm   LV E/e' lateral: 14.9 LV SV:         90 LV SV Index:   53 LVOT Area:     6.83 cm  RIGHT VENTRICLE             IVC RV S prime:     12.90 cm/s  IVC diam: 1.30 cm TAPSE (M-mode): 2.0 cm LEFT ATRIUM           Index LA diam:      2.00 cm 1.19 cm/m LA Vol (A4C): 33.6 ml 19.98 ml/m  AORTIC VALVE LVOT Vmax:   67.50 cm/s LVOT Vmean:  50.200 cm/s LVOT VTI:    0.131 m  AORTA  Ao Root diam: 3.40 cm Ao Asc diam:  3.80 cm MITRAL VALVE MV Area (PHT): 4.29 cm     SHUNTS MV Decel Time: 177 msec     Systemic VTI:  0.13 m MV E velocity: 87.50 cm/s   Systemic Diam: 2.95 cm MV A velocity: 104.00 cm/s MV E/A ratio:  0.84 Kardie Tobb DO Electronically signed by Thomasene Ripple DO Signature Date/Time: 06/06/2023/2:02:10 PM    Final     Cardiac Studies  TTE 06/06/2023 1. Left ventricular ejection fraction, by estimation, is 45 to 50%. The  left ventricle has  mildly decreased function. The left ventricle  demonstrates global hypokinesis. Left ventricular diastolic parameters are  consistent with Grade I diastolic  dysfunction (impaired relaxation).   2. Right ventricular systolic function is normal. The right ventricular  size is normal.   3. A small pericardial effusion is present. The pericardial effusion is  circumferential.   4. The mitral valve is normal in structure. Trivial mitral valve  regurgitation. No evidence of mitral stenosis.   5. The aortic valve is normal in structure. Aortic valve regurgitation is  mild to moderate. No aortic stenosis is present.   6. There is borderline dilatation of the ascending aorta, measuring 38  mm.   7. The inferior vena cava is normal in size with greater than 50%  respiratory variability, suggesting right atrial pressure of 3 mmHg.    Patient Profile  SYNETHIA TAKACH is a 67 y.o. 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 The patient required intubation. She was weaned and extubated. She was transferred to the floor. An echocardiogram  here demonstrated mild LV dysfunction with ejection fraction of 45 to 50% and no significant valvular abnormalities. She was seen by Dr. Lynnette Caffey and after a family discussion, they agreed that she is not a candidate for coronary angiography or PCI.  She can continue amiodarone load. K>4, Mg>2. No anticipated changes from a cardiology perspective.   For questions or updates, please contact [facility] Please consult [email or web address] for contact info under        Signed, Maisie Fus, MD  06/08/2023, 8:47 AM

## 2023-06-09 DIAGNOSIS — I469 Cardiac arrest, cause unspecified: Secondary | ICD-10-CM | POA: Diagnosis not present

## 2023-06-09 LAB — BASIC METABOLIC PANEL
Anion gap: 9 (ref 5–15)
BUN: 22 mg/dL (ref 8–23)
CO2: 27 mmol/L (ref 22–32)
Calcium: 8.4 mg/dL — ABNORMAL LOW (ref 8.9–10.3)
Chloride: 104 mmol/L (ref 98–111)
Creatinine, Ser: 1.02 mg/dL — ABNORMAL HIGH (ref 0.44–1.00)
GFR, Estimated: >60 mL/min (ref >60)
Glucose, Bld: 117 mg/dL — ABNORMAL HIGH (ref 70–99)
Potassium: 4.1 mmol/L (ref 3.5–5.1)
Sodium: 140 mmol/L (ref 135–145)

## 2023-06-09 LAB — GLUCOSE, CAPILLARY
Glucose-Capillary: 103 mg/dL — ABNORMAL HIGH (ref 70–99)
Glucose-Capillary: 202 mg/dL — ABNORMAL HIGH (ref 70–99)
Glucose-Capillary: 226 mg/dL — ABNORMAL HIGH (ref 70–99)
Glucose-Capillary: 143 mg/dL — ABNORMAL HIGH (ref 70–99)

## 2023-06-09 LAB — CBC
HCT: 22.3 % — ABNORMAL LOW (ref 36.0–46.0)
Hemoglobin: 7.7 g/dL — ABNORMAL LOW (ref 12.0–15.0)
MCH: 33.2 pg (ref 26.0–34.0)
MCHC: 34.5 g/dL (ref 30.0–36.0)
MCV: 96.1 fL (ref 80.0–100.0)
Platelets: 258 10*3/uL (ref 150–400)
RBC: 2.32 MIL/uL — ABNORMAL LOW (ref 3.87–5.11)
RDW: 16.5 % — ABNORMAL HIGH (ref 11.5–15.5)
WBC: 10.6 10*3/uL — ABNORMAL HIGH (ref 4.0–10.5)
nRBC: 0.3 % — ABNORMAL HIGH (ref 0.0–0.2)

## 2023-06-09 LAB — MAGNESIUM: Magnesium: 2.2 mg/dL (ref 1.7–2.4)

## 2023-06-09 MED ORDER — BISACODYL 10 MG RE SUPP: 10.0000 mg | Freq: Every day | RECTAL | Status: DC | PRN

## 2023-06-09 MED ORDER — HYDROCORT-PRAMOXINE (PERIANAL) 1-1 % EX FOAM: 1.0000 | Freq: Four times a day (QID) | CUTANEOUS | Status: DC | PRN

## 2023-06-09 MED ORDER — LIDOCAINE 5 % EX PTCH
1.0000 | MEDICATED_PATCH | CUTANEOUS | Status: DC
  Filled 2023-06-09: qty 1

## 2023-06-09 MED ORDER — BISACODYL 10 MG RE SUPP: 10.0000 mg | Freq: Once | RECTAL | Status: DC

## 2023-06-09 NOTE — Progress Notes (Signed)
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  She received 5 shocks via EMS for reported VF prior to her arrival and 2 rounds of epinephrine.  On ED arrival she was actually hemodynamically stable but was emergently intubated for airway protection.  Family reported concern that her arrest may have been preceded by seizure activity.  She was initially admitted to the ICU and cared for by PCCM.  She was able to be rapidly weaned from the ventilator and extubated 06/05/2023 at 08:28.  To the surprise and delight of the medical team she appeared to be mentally intact with no focal deficits following extubation.  Goals of Care:   Code Status: Full Code   DVT prophylaxis: Place and maintain sequential compression device Start: 06/06/23 0952 SCDs Start: 06/03/23 1618  Interim Hx: No acute events recorded overnight.  When meeting with palliative care yesterday the patient decided to rescind her DNR order and is now full code.  Afebrile.  Vital signs stable.  No new complaints today.  Resting comfortably in bed.  Assessment & Plan:  Out-of-hospital V-fib cardiac arrest Underwent shock x 5 and epi x 2 - family suggest arrest preceded by possible seizure described as rhoncerous upper airway sounds/grunting, but not tonic/clonic type motion - now successfully extubated and appears remarkably stable - TTE notes diminished EF at 45-50% with global hypokinesis - Cardiology agree she is not a candidate for invasive intervention and suggests continuing amiodarone  Postarrest acute lung injury - postarrest acute aspiration pneumonitis Care initially provided by PCCM -respiratory status appears stable with patient now successfully  extubated and successfully weaned to room air  Possible seizure Family reported sx they interpreted as a seizure preceding her cardiac arrest - she would certainly be at risk for this with multiple brain mets - discussed w/ Neurology who report Keppra is an appropriate choice for AED in this situation - EEG 9/23 suggestive of severe diffuse encephalopathy with no seizure or epileptiform discharges noted - no active seizure activity appreciated since hospitalization - it is certainly possible that rather than seizure she was experiencing arrhythmia related loss of consciousness leading to her respiratory irregularities though she of course would be at risk for seizure activity as well - plan to continue Keppra indefinitely   Multiple bilateral rib fractures As noted on CT chest 9/23 - due to CPR - pain management continues  Postarrest encephalopathy Mental status appears to have returned to baseline at this time  Age-indeterminate 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 [name] and Dr. Barbaraann Cao - I have added them both to her inpatient care team and made them aware of her admissoin - she was  scheduled for tx 9/27 but this has been canceled - she will f/u w/ Dr. [name] to reschedule shortly after she is able to be d/c home   Normocytic anemia Hemoglobin had been on a steady downward trend since admission, but has since stabilized- no obvious source of blood  loss  Hypokalemia Supplemented to goal  Mild hypomagnesemia Supplemented to goal  Acute urinary retention After having Foley catheter placed while critically ill/intubated -monitor with repeat In-N-Out catheter as needed -Urecholine added for short course -will need to reattempt Foley discontinuation when patient is on rehab and more mobile  Family Communication: Spoke with husband at bedside Disposition: awaiting bed in CIR -medically stable for discharge to CIR as soon as bed identified   Objective: Blood pressure 122/69, pulse 74, temperature 97.8 F (36.6 C), temperature source Oral, resp. rate 16, height 5\' 2"  (1.575 m), weight 68 kg, SpO2 97%.  Intake/Output Summary (Last 24 hours) at 06/09/2023 0851 Last data filed at 06/09/2023 0630 Gross per 24 hour  Intake 464.17 ml  Output 2000 ml  Net -1535.83 ml   Filed Weights   06/06/23 0412 06/07/23 0400 06/08/23 0422  Weight: 67.1 kg 67.1 kg 68 kg    Examination: General: No acute respiratory distress Lungs: Clear to auscultation bilaterally -no wheezing Cardiovascular: Regular rate and rhythm -no appreciable murmur Abdomen: Nontender, nondistended, soft, bowel sounds positive, no rebound Extremities: No significant edema B LE   CBC: Recent Labs  Lab 06/03/23 1453 06/03/23 1618 06/06/23 0436 06/08/23 0422 06/09/23 0356  WBC 15.1*   < > 10.0 10.2 10.6*  NEUTROABS 10.4*  --   --   --   --   HGB 10.2*   < > 7.6* 7.6* 7.7*  HCT 31.7*   < > 21.9* 22.8* 22.3*  MCV 100.6*   < > 98.2 97.4 96.1  PLT 144*   < > 198 240 258   < > = values in this interval not displayed.   Basic Metabolic Panel: Recent Labs  Lab 06/04/23 0421 06/04/23 0706 06/06/23 0436 06/08/23 0422 06/09/23 0356  NA  --    < > 140 142 140  K  --    < > 3.3* 3.6 4.1  CL  --    < > 107 101 104  CO2  --    < > 26 29 27   GLUCOSE  --    < > 111* 126* 117*  BUN  --    < > 31* 28* 22  CREATININE  --    < > 1.25* 1.15* 1.02*  CALCIUM  --    < > 7.4*  8.6* 8.4*  MG 1.4*   < > 2.3 1.7 2.2  PHOS 5.0*  --   --   --   --    < > = values in this interval not displayed.   GFR: Estimated Creatinine Clearance: 49.7 mL/min (A) (by C-G formula based on SCr of 1.02 mg/dL (H)).   Scheduled Meds:  amiodarone  400 mg Oral BID   Followed by   Melene Muller ON 06/11/2023] amiodarone  400 mg Oral Daily   Followed by   Melene Muller ON 06/18/2023] amiodarone  200 mg Oral Daily   amoxicillin-clavulanate  1 tablet Oral Q12H   bethanechol  10 mg Oral TID   Chlorhexidine Gluconate Cloth  6 each Topical Daily   dexamethasone  2 mg Oral Daily   Followed by   Melene Muller ON 06/14/2023] dexamethasone  1 mg Oral Daily  docusate sodium  100 mg Oral BID   famotidine  20 mg Oral Daily   feeding supplement  237 mL Oral BID BM   levETIRAcetam  1,000 mg Oral BID   OLANZapine  2.5 mg Oral QHS   mouth rinse  15 mL Mouth Rinse 4 times per day   oxyCODONE  10 mg Oral Q12H   pantoprazole  40 mg Oral Daily   polyethylene glycol  17 g Oral Daily   potassium chloride  40 mEq Oral BID     LOS: 6 days   Lonia Blood, MD [facility] Office  [phone] Pager - Text Page per Loretha Stapler  If 7PM-7AM, please contact night-coverage per Amion 06/09/2023, 8:51 AM

## 2023-06-09 NOTE — Plan of Care (Signed)

## 2023-06-09 NOTE — Plan of Care (Signed)
  Problem: Health Behavior/Discharge Planning: Goal: Ability to manage health-related needs will improve Outcome: Progressing   Problem: Clinical Measurements: Goal: Ability to maintain clinical measurements within normal limits will improve Outcome: Progressing Goal: Will remain free from infection Outcome: Progressing Goal: Respiratory complications will improve Outcome: Progressing Goal: Cardiovascular complication will be avoided Outcome: Progressing   Problem: Nutrition: Goal: Adequate nutrition will be maintained Outcome: Progressing   Problem: Coping: Goal: Level of anxiety will decrease Outcome: Progressing   Problem: Pain Managment: Goal: General experience of comfort will improve Outcome: Progressing   Problem: Safety: Goal: Ability to remain free from injury will improve Outcome: Progressing   Problem: Skin Integrity: Goal: Risk for impaired skin integrity will decrease Outcome: Progressing

## 2023-06-10 ENCOUNTER — Other Ambulatory Visit: Payer: Self-pay

## 2023-06-10 ENCOUNTER — Inpatient Hospital Stay (HOSPITAL_COMMUNITY)
Admission: RE | Admit: 2023-06-10 | Discharge: 2023-07-02 | DRG: 945 | Disposition: A | Discharge status: Home Health | Payer: Medicare Other | Source: Intra-hospital | Attending: Physical Medicine and Rehabilitation | Admitting: Physical Medicine and Rehabilitation

## 2023-06-10 ENCOUNTER — Other Ambulatory Visit: Payer: Self-pay | Admitting: Physician Assistant

## 2023-06-10 ENCOUNTER — Encounter (HOSPITAL_COMMUNITY): Payer: Self-pay | Admitting: Physical Medicine and Rehabilitation

## 2023-06-10 ENCOUNTER — Ambulatory Visit: Payer: Medicare Other | Admitting: Internal Medicine

## 2023-06-10 DIAGNOSIS — C7951 Secondary malignant neoplasm of bone: Secondary | ICD-10-CM | POA: Diagnosis present

## 2023-06-10 DIAGNOSIS — Z888 Allergy status to other drugs, medicaments and biological substances status: Secondary | ICD-10-CM

## 2023-06-10 DIAGNOSIS — K219 Gastro-esophageal reflux disease without esophagitis: Secondary | ICD-10-CM | POA: Diagnosis present

## 2023-06-10 DIAGNOSIS — I10 Essential (primary) hypertension: Secondary | ICD-10-CM

## 2023-06-10 DIAGNOSIS — G9349 Other encephalopathy: Secondary | ICD-10-CM | POA: Diagnosis present

## 2023-06-10 DIAGNOSIS — K644 Residual hemorrhoidal skin tags: Secondary | ICD-10-CM | POA: Diagnosis present

## 2023-06-10 DIAGNOSIS — C7931 Secondary malignant neoplasm of brain: Secondary | ICD-10-CM | POA: Diagnosis present

## 2023-06-10 DIAGNOSIS — M79605 Pain in left leg: Secondary | ICD-10-CM | POA: Diagnosis not present

## 2023-06-10 DIAGNOSIS — Z8249 Family history of ischemic heart disease and other diseases of the circulatory system: Secondary | ICD-10-CM

## 2023-06-10 DIAGNOSIS — Z87891 Personal history of nicotine dependence: Secondary | ICD-10-CM

## 2023-06-10 DIAGNOSIS — L89316 Pressure-induced deep tissue damage of right buttock: Secondary | ICD-10-CM | POA: Diagnosis present

## 2023-06-10 DIAGNOSIS — Z515 Encounter for palliative care: Secondary | ICD-10-CM | POA: Diagnosis not present

## 2023-06-10 DIAGNOSIS — D62 Acute posthemorrhagic anemia: Secondary | ICD-10-CM

## 2023-06-10 DIAGNOSIS — E876 Hypokalemia: Secondary | ICD-10-CM

## 2023-06-10 DIAGNOSIS — Z960 Presence of urogenital implants: Secondary | ICD-10-CM | POA: Diagnosis present

## 2023-06-10 DIAGNOSIS — Z66 Do not resuscitate: Secondary | ICD-10-CM | POA: Diagnosis present

## 2023-06-10 DIAGNOSIS — I480 Paroxysmal atrial fibrillation: Secondary | ICD-10-CM

## 2023-06-10 DIAGNOSIS — I462 Cardiac arrest due to underlying cardiac condition: Secondary | ICD-10-CM | POA: Diagnosis present

## 2023-06-10 DIAGNOSIS — T66XXXD Radiation sickness, unspecified, subsequent encounter: Secondary | ICD-10-CM | POA: Diagnosis not present

## 2023-06-10 DIAGNOSIS — R5383 Other fatigue: Secondary | ICD-10-CM | POA: Diagnosis not present

## 2023-06-10 DIAGNOSIS — E8770 Fluid overload, unspecified: Secondary | ICD-10-CM | POA: Diagnosis not present

## 2023-06-10 DIAGNOSIS — Z7984 Long term (current) use of oral hypoglycemic drugs: Secondary | ICD-10-CM | POA: Diagnosis not present

## 2023-06-10 DIAGNOSIS — E1165 Type 2 diabetes mellitus with hyperglycemia: Secondary | ICD-10-CM | POA: Diagnosis not present

## 2023-06-10 DIAGNOSIS — G939 Disorder of brain, unspecified: Secondary | ICD-10-CM | POA: Diagnosis not present

## 2023-06-10 DIAGNOSIS — R000 Tachycardia, unspecified: Secondary | ICD-10-CM | POA: Diagnosis not present

## 2023-06-10 DIAGNOSIS — E7800 Pure hypercholesterolemia, unspecified: Secondary | ICD-10-CM | POA: Diagnosis present

## 2023-06-10 DIAGNOSIS — R601 Generalized edema: Secondary | ICD-10-CM | POA: Diagnosis not present

## 2023-06-10 DIAGNOSIS — Z8674 Personal history of sudden cardiac arrest: Secondary | ICD-10-CM | POA: Diagnosis not present

## 2023-06-10 DIAGNOSIS — K5900 Constipation, unspecified: Secondary | ICD-10-CM | POA: Diagnosis present

## 2023-06-10 DIAGNOSIS — L89326 Pressure-induced deep tissue damage of left buttock: Secondary | ICD-10-CM | POA: Diagnosis present

## 2023-06-10 DIAGNOSIS — N179 Acute kidney failure, unspecified: Secondary | ICD-10-CM | POA: Diagnosis present

## 2023-06-10 DIAGNOSIS — G8929 Other chronic pain: Secondary | ICD-10-CM | POA: Diagnosis present

## 2023-06-10 DIAGNOSIS — I2510 Atherosclerotic heart disease of native coronary artery without angina pectoris: Secondary | ICD-10-CM

## 2023-06-10 DIAGNOSIS — R339 Retention of urine, unspecified: Secondary | ICD-10-CM

## 2023-06-10 DIAGNOSIS — Z8673 Personal history of transient ischemic attack (TIA), and cerebral infarction without residual deficits: Secondary | ICD-10-CM

## 2023-06-10 DIAGNOSIS — C3490 Malignant neoplasm of unspecified part of unspecified bronchus or lung: Secondary | ICD-10-CM

## 2023-06-10 DIAGNOSIS — R531 Weakness: Secondary | ICD-10-CM | POA: Diagnosis present

## 2023-06-10 DIAGNOSIS — I4901 Ventricular fibrillation: Secondary | ICD-10-CM | POA: Diagnosis present

## 2023-06-10 DIAGNOSIS — M549 Dorsalgia, unspecified: Secondary | ICD-10-CM | POA: Diagnosis present

## 2023-06-10 DIAGNOSIS — Z90710 Acquired absence of both cervix and uterus: Secondary | ICD-10-CM

## 2023-06-10 DIAGNOSIS — Z79899 Other long term (current) drug therapy: Secondary | ICD-10-CM

## 2023-06-10 DIAGNOSIS — F419 Anxiety disorder, unspecified: Secondary | ICD-10-CM | POA: Diagnosis present

## 2023-06-10 DIAGNOSIS — M79604 Pain in right leg: Secondary | ICD-10-CM | POA: Diagnosis not present

## 2023-06-10 DIAGNOSIS — M96A3 Multiple fractures of ribs associated with chest compression and cardiopulmonary resuscitation: Secondary | ICD-10-CM | POA: Diagnosis present

## 2023-06-10 DIAGNOSIS — Z85118 Personal history of other malignant neoplasm of bronchus and lung: Secondary | ICD-10-CM | POA: Diagnosis not present

## 2023-06-10 DIAGNOSIS — R5381 Other malaise: Principal | ICD-10-CM

## 2023-06-10 DIAGNOSIS — R42 Dizziness and giddiness: Secondary | ICD-10-CM | POA: Diagnosis not present

## 2023-06-10 DIAGNOSIS — R471 Dysarthria and anarthria: Secondary | ICD-10-CM | POA: Diagnosis present

## 2023-06-10 DIAGNOSIS — I469 Cardiac arrest, cause unspecified: Secondary | ICD-10-CM

## 2023-06-10 DIAGNOSIS — I959 Hypotension, unspecified: Secondary | ICD-10-CM | POA: Diagnosis not present

## 2023-06-10 DIAGNOSIS — E119 Type 2 diabetes mellitus without complications: Secondary | ICD-10-CM | POA: Diagnosis not present

## 2023-06-10 DIAGNOSIS — I5021 Acute systolic (congestive) heart failure: Secondary | ICD-10-CM | POA: Diagnosis not present

## 2023-06-10 DIAGNOSIS — Z8679 Personal history of other diseases of the circulatory system: Secondary | ICD-10-CM

## 2023-06-10 DIAGNOSIS — Z86718 Personal history of other venous thrombosis and embolism: Secondary | ICD-10-CM

## 2023-06-10 DIAGNOSIS — T380X5D Adverse effect of glucocorticoids and synthetic analogues, subsequent encounter: Secondary | ICD-10-CM

## 2023-06-10 LAB — BASIC METABOLIC PANEL
Anion gap: 8 (ref 5–15)
BUN: 23 mg/dL (ref 8–23)
CO2: 26 mmol/L (ref 22–32)
Calcium: 8.7 mg/dL — ABNORMAL LOW (ref 8.9–10.3)
Chloride: 105 mmol/L (ref 98–111)
Creatinine, Ser: 1.16 mg/dL — ABNORMAL HIGH (ref 0.44–1.00)
GFR, Estimated: 52 mL/min — ABNORMAL LOW (ref >60)
Glucose, Bld: 173 mg/dL — ABNORMAL HIGH (ref 70–99)
Potassium: 3.9 mmol/L (ref 3.5–5.1)
Sodium: 139 mmol/L (ref 135–145)

## 2023-06-10 LAB — GLUCOSE, CAPILLARY
Glucose-Capillary: 106 mg/dL — ABNORMAL HIGH (ref 70–99)
Glucose-Capillary: 171 mg/dL — ABNORMAL HIGH (ref 70–99)
Glucose-Capillary: 229 mg/dL — ABNORMAL HIGH (ref 70–99)
Glucose-Capillary: 184 mg/dL — ABNORMAL HIGH (ref 70–99)

## 2023-06-10 MED ORDER — POLYETHYLENE GLYCOL 3350 17 G PO PACK: 17.0000 g | PACK | Freq: Every day | ORAL | Status: DC | PRN

## 2023-06-10 MED ORDER — ALPRAZOLAM 0.25 MG PO TABS
0.1250 mg | ORAL_TABLET | Freq: Three times a day (TID) | ORAL | Status: DC | PRN
  Administered 2023-06-13 – 2023-06-15 (×2): 0.25 mg via ORAL
  Administered 2023-06-17: 0.125 mg via ORAL
  Administered 2023-06-19 – 2023-07-01 (×6): 0.25 mg via ORAL
  Filled 2023-06-10 (×12): qty 1

## 2023-06-10 MED ORDER — AMIODARONE HCL 200 MG PO TABS
400.0000 mg | ORAL_TABLET | Freq: Every day | ORAL | Status: AC
  Administered 2023-06-11 – 2023-06-17 (×7): 400 mg via ORAL
  Filled 2023-06-10 (×7): qty 2

## 2023-06-10 MED ORDER — LEVETIRACETAM 500 MG PO TABS
1000.0000 mg | ORAL_TABLET | Freq: Two times a day (BID) | ORAL | Status: DC
  Administered 2023-06-10 – 2023-07-02 (×44): 1000 mg via ORAL
  Filled 2023-06-10 (×44): qty 2

## 2023-06-10 MED ORDER — FLEET ENEMA RE ENEM: 1.0000 | ENEMA | Freq: Once | RECTAL | Status: DC | PRN

## 2023-06-10 MED ORDER — PANTOPRAZOLE SODIUM 40 MG PO TBEC
40.0000 mg | DELAYED_RELEASE_TABLET | Freq: Every day | ORAL | Status: DC
  Administered 2023-06-11 – 2023-07-02 (×22): 40 mg via ORAL
  Filled 2023-06-10 (×22): qty 1

## 2023-06-10 MED ORDER — OXYCODONE HCL ER 10 MG PO T12A
10.0000 mg | EXTENDED_RELEASE_TABLET | Freq: Two times a day (BID) | ORAL | Status: DC
  Administered 2023-06-10 – 2023-07-02 (×44): 10 mg via ORAL
  Filled 2023-06-10 (×44): qty 1

## 2023-06-10 MED ORDER — LIDOCAINE HCL URETHRAL/MUCOSAL 2 % EX GEL: 1.0000 | CUTANEOUS | Status: DC | PRN

## 2023-06-10 MED ORDER — MECLIZINE HCL 25 MG PO TABS
12.5000 mg | ORAL_TABLET | Freq: Three times a day (TID) | ORAL | Status: DC | PRN
  Administered 2023-06-11 – 2023-06-15 (×4): 25 mg via ORAL
  Filled 2023-06-10 (×4): qty 1

## 2023-06-10 MED ORDER — DIPHENHYDRAMINE HCL 25 MG PO CAPS
25.0000 mg | ORAL_CAPSULE | Freq: Four times a day (QID) | ORAL | Status: DC | PRN
  Filled 2023-06-10: qty 1

## 2023-06-10 MED ORDER — DEXAMETHASONE 0.5 MG PO TABS
1.0000 mg | ORAL_TABLET | Freq: Every day | ORAL | Status: AC
  Administered 2023-06-14 – 2023-06-20 (×7): 1 mg via ORAL
  Filled 2023-06-10 (×7): qty 2

## 2023-06-10 MED ORDER — AMIODARONE HCL 200 MG PO TABS
200.0000 mg | ORAL_TABLET | Freq: Every day | ORAL | Status: AC
  Administered 2023-06-18 – 2023-06-24 (×7): 200 mg via ORAL
  Filled 2023-06-10 (×7): qty 1

## 2023-06-10 MED ORDER — GUAIFENESIN-DM 100-10 MG/5ML PO SYRP
10.0000 mL | ORAL_SOLUTION | Freq: Four times a day (QID) | ORAL | Status: DC | PRN
  Filled 2023-06-10: qty 10

## 2023-06-10 MED ORDER — INSULIN ASPART 100 UNIT/ML IJ SOLN
0.0000 [IU] | Freq: Three times a day (TID) | INTRAMUSCULAR | Status: DC
  Administered 2023-06-11: 1 [IU] via SUBCUTANEOUS
  Administered 2023-06-11: 2 [IU] via SUBCUTANEOUS
  Administered 2023-06-12: 3 [IU] via SUBCUTANEOUS
  Administered 2023-06-13 (×2): 1 [IU] via SUBCUTANEOUS
  Administered 2023-06-13: 2 [IU] via SUBCUTANEOUS
  Administered 2023-06-14: 1 [IU] via SUBCUTANEOUS
  Administered 2023-06-15 (×2): 2 [IU] via SUBCUTANEOUS
  Administered 2023-06-16: 1 [IU] via SUBCUTANEOUS
  Administered 2023-06-16: 2 [IU] via SUBCUTANEOUS
  Administered 2023-06-17: 1 [IU] via SUBCUTANEOUS
  Administered 2023-06-17: 2 [IU] via SUBCUTANEOUS
  Administered 2023-06-18 – 2023-06-20 (×3): 1 [IU] via SUBCUTANEOUS
  Administered 2023-06-23: 3 [IU] via SUBCUTANEOUS
  Administered 2023-06-25: 1 [IU] via SUBCUTANEOUS
  Administered 2023-06-26 – 2023-06-28 (×2): 2 [IU] via SUBCUTANEOUS
  Administered 2023-06-30: 1 [IU] via SUBCUTANEOUS
  Administered 2023-06-30 – 2023-07-01 (×2): 2 [IU] via SUBCUTANEOUS

## 2023-06-10 MED ORDER — MECLIZINE HCL 25 MG PO TABS: 25.0000 mg | ORAL_TABLET | Freq: Three times a day (TID) | ORAL | Status: DC | PRN

## 2023-06-10 MED ORDER — HYDROCODONE-ACETAMINOPHEN 7.5-325 MG PO TABS: 1.0000 | ORAL_TABLET | Freq: Four times a day (QID) | ORAL | Status: DC | PRN

## 2023-06-10 MED ORDER — ONDANSETRON HCL 4 MG PO TABS: 4.0000 mg | ORAL_TABLET | Freq: Four times a day (QID) | ORAL | Status: DC | PRN

## 2023-06-10 MED ORDER — LIDOCAINE 5 % EX PTCH
1.0000 | MEDICATED_PATCH | CUTANEOUS | Status: DC
  Administered 2023-06-13 – 2023-06-14 (×2): 1 via TRANSDERMAL
  Filled 2023-06-10 (×13): qty 1

## 2023-06-10 MED ORDER — OLANZAPINE 2.5 MG PO TABS
2.5000 mg | ORAL_TABLET | Freq: Every day | ORAL | Status: DC
  Administered 2023-06-10 – 2023-07-01 (×22): 2.5 mg via ORAL
  Filled 2023-06-10 (×22): qty 1

## 2023-06-10 MED ORDER — ALUM & MAG HYDROXIDE-SIMETH 200-200-20 MG/5ML PO SUSP: 30.0000 mL | ORAL | Status: DC | PRN

## 2023-06-10 MED ORDER — OXYCODONE HCL 5 MG PO TABS
5.0000 mg | ORAL_TABLET | ORAL | Status: DC | PRN
  Administered 2023-06-11 (×3): 10 mg via ORAL
  Filled 2023-06-10 (×3): qty 2

## 2023-06-10 MED ORDER — BISACODYL 5 MG PO TBEC
5.0000 mg | DELAYED_RELEASE_TABLET | Freq: Every day | ORAL | Status: DC | PRN
  Administered 2023-06-18: 5 mg via ORAL
  Filled 2023-06-10: qty 1

## 2023-06-10 MED ORDER — HYDROCORT-PRAMOXINE (PERIANAL) 1-1 % EX FOAM: 1.0000 | Freq: Four times a day (QID) | CUTANEOUS | Status: DC | PRN

## 2023-06-10 MED ORDER — ONDANSETRON HCL 4 MG/2ML IJ SOLN: 4.0000 mg | Freq: Four times a day (QID) | INTRAMUSCULAR | Status: DC | PRN

## 2023-06-10 MED ORDER — ACETAMINOPHEN 325 MG PO TABS
325.0000 mg | ORAL_TABLET | ORAL | Status: DC | PRN
  Administered 2023-06-20 – 2023-06-30 (×2): 650 mg via ORAL
  Filled 2023-06-10 (×2): qty 2

## 2023-06-10 MED ORDER — BETHANECHOL CHLORIDE 10 MG PO TABS
10.0000 mg | ORAL_TABLET | Freq: Three times a day (TID) | ORAL | Status: AC
  Administered 2023-06-10 – 2023-06-11 (×2): 10 mg via ORAL
  Filled 2023-06-10 (×2): qty 1

## 2023-06-10 MED ORDER — DEXAMETHASONE 4 MG PO TABS
2.0000 mg | ORAL_TABLET | Freq: Every day | ORAL | Status: AC
  Administered 2023-06-11 – 2023-06-13 (×3): 2 mg via ORAL
  Filled 2023-06-10 (×3): qty 1

## 2023-06-10 NOTE — H&P (Signed)
Physical Medicine and Rehabilitation Admission H&P     CC: Functional deficits secondary to out-of-hospital cardiac arrest   HPI: Virginia [name] is 67 year old female who suffered a + cardiac arrest. Presenting rhythm was apparently VF, but family felt that the arrest may have been brought on by a seizure. Estimated downtime greater than 20 minutes. On ED arrival patient was seen hemodynamically stable but emergently intubated for airway protection.   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  She had nevertheless responded well to recent treatment. She also has a history of prior DVT, hypertension, hyperlipidemia, tobacco abuse and anxiety. S/p craniotomy, resection of progressive cerebellar mass by Dr. Venetia Maxon on 12/02/2020.  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    Extubated on 9/25. Transitioned to oral amiodarone. Not a candidate for [facility] due to ? prior brain bleed while on Eliquis in setting of brain mets. Unclear reason for no pharmacologic DVT prophylaxis. TTE notes diminished EF at 45-50% with global hypokinesis - Cardiology agree she is not a candidate for invasive intervention and suggests continuing amiodarone. EEG 9/23 suggestive of severe diffuse encephalopathy with no seizure or epileptiform discharges noted - no active seizure activity appreciated since hospitalization - it is certainly possible that rather than seizure she was experiencing arrhythmia related syncope leading to her respiratory irregularities though she of course would be at risk for seizure activity as well - plan to continue Keppra indefinitely. Decadron taper for acute lung injury.   Per Dr. Barbaraann Cao note on 05/23/2023 neuro exam: Cranial nerves: noted dysarthria Motor: Dense dysmetria, coordination impaired bilaterally. Sensory: Intact to light touch Gait: Non ambulatory today. Pt and husband state she was ambulatory with RW/rollator and able to climb stairs prior to admission.   A1c = 7.3% start  carb modified diet and SSI. Voiding trial if not already done. Discussed anemia, DM and DVT prophylaxis (at minimum TED hose) with patient and her husband. Check stool for occult blood. The patient requires inpatient medicine and rehabilitation evaluations and services for ongoing dysfunction secondary to cardiac arrest and lung cancer with brain mets.   Review of Systems  Constitutional:  Positive for malaise/fatigue.  HENT: Negative.    Respiratory:  Negative for cough.   Cardiovascular:  Negative for chest pain.  Gastrointestinal: Negative.   Genitourinary:        In dwelling Foley for retention  Skin: Negative.   Neurological:  Positive for dizziness. Negative for headaches.  Psychiatric/Behavioral:  The patient does not have insomnia.         Past Medical History:  Diagnosis Date   Anemia     Anxiety     Concussion 09/28/2019   DVT (deep venous thrombosis) (HCC) 2021    L leg   Dyspnea     GERD (gastroesophageal reflux disease)     Hypercholesterolemia      per pt, she does not have elevated lipids   Hypertension     met lung ca dx'd 09/2019    mets to spine, hip and brain   PONV (postoperative nausea and vomiting)     Tobacco abuse               Past Surgical History:  Procedure Laterality Date   ABDOMINAL HYSTERECTOMY        partial/ left ovaries   APPLICATION OF CRANIAL NAVIGATION N/A 12/02/2020    Procedure: APPLICATION OF CRANIAL NAVIGATION;  Surgeon: Maeola Harman, MD;  Location: [facility] OR;  Service: Neurosurgery;  Laterality: N/A;   CHOLECYSTECTOMY       CRANIOTOMY N/A 12/02/2020    Procedure: Posterior fossa craniotomy for tumor resection with brainlab;  Surgeon: Maeola Harman, MD;  Location: [facility] OR;  Service: Neurosurgery;  Laterality: N/A;   DILATION AND CURETTAGE OF UTERUS       IR IMAGING GUIDED PORT INSERTION   10/23/2019   IR PATIENT EVAL TECH 0-60 MINS   11/21/2021   IR PATIENT EVAL TECH 0-60 MINS   11/24/2021   IR PATIENT EVAL TECH 0-60 MINS   11/29/2021   IR  PATIENT EVAL TECH 0-60 MINS   12/04/2021   IR PATIENT EVAL TECH 0-60 MINS   12/12/2021   IR PATIENT EVAL TECH 0-60 MINS   12/19/2021   IR PATIENT EVAL TECH 0-60 MINS   12/27/2021   IR PATIENT EVAL TECH 0-60 MINS   01/03/2022   IR PATIENT EVAL TECH 0-60 MINS   01/10/2022   IR PATIENT EVAL TECH 0-60 MINS   01/18/2022   IR PATIENT EVAL TECH 0-60 MINS   02/13/2022   IR PATIENT EVAL TECH 0-60 MINS   02/20/2022   IR RADIOLOGIST EVAL & MGMT   01/23/2022   IR REMOVAL TUN ACCESS W/ PORT W/O FL MOD SED   11/17/2021   KYPHOPLASTY N/A 03/15/2020    Procedure: Thoracic Eight KYPHOPLASTY;  Surgeon: Maeola Harman, MD;  Location: [facility] OR;  Service: Neurosurgery;  Laterality: N/A;  prone     LIPOMA EXCISION   2018    removed under left breast and right thigh.   TUBAL LIGATION                 Family History  Problem Relation Age of Onset   Heart disease Father     Drug abuse Daughter     Drug abuse Son     Cancer Sister     Heart disease Brother     Heart attack Brother          Social History:  reports that she quit smoking about 10 years ago. Her smoking use included cigarettes. She has never used smokeless tobacco. She reports current alcohol use. She reports that she does not use drugs. Allergies:  Allergies  No Known Allergies         Medications Prior to Admission  Medication Sig Dispense Refill   acetaminophen (TYLENOL) 500 MG tablet Take 500 mg by mouth 2 (two) times daily as needed for moderate pain.       albuterol (VENTOLIN HFA) 108 (90 Base) MCG/ACT inhaler INHALE 2 PUFFS INTO THE LUNGS EVERY 6 HOURS AS NEEDED FOR WHEEZING OR SHORTNESS OF BREATH 6.7 g 11   ALPRAZolam (XANAX) 0.25 MG tablet TAKE 1 TABLET(0.25 MG) BY MOUTH TWICE DAILY AS NEEDED FOR ANXIETY 60 tablet 0   amLODipine (NORVASC) 10 MG tablet TAKE 1 TABLET BY MOUTH EVERY DAY 90 tablet 3   Cyanocobalamin (VITAMIN B12 PO) Take 1 tablet by mouth daily. Gummies       dexamethasone (DECADRON) 1 MG tablet Take 3 tablets by mouth daily with  breakfast for 7 days, THEN 2 tablets daily with breakfast for 7 days, THEN 1 tablet daily with breakfast for 7 days. 42 tablet 0   EPINEPHrine 0.3 mg/0.3 mL IJ SOAJ injection Inject 0.3 mg into the muscle as needed for anaphylaxis. 2 each 0   folic acid (FOLVITE) 1 MG tablet TAKE 1 TABLET BY MOUTH DAILY 90 tablet 1   furosemide (LASIX) 20 MG  tablet Take 2 tablets (40 mg total) by mouth 2 times daily. 360 tablet 0   Glucosamine HCl (GLUCOSAMINE PO) Take 2 tablets by mouth daily.       hydrocortisone (ANUSOL-HC) 2.5 % rectal cream use in rectal area 2 to 3 times a day as needed (Patient taking differently: Place 1 Application rectally daily as needed for hemorrhoids.) 30 g PRN   LYSINE PO Take 2 tablets by mouth daily.       meclizine (ANTIVERT) 25 MG tablet Take 1 tablet (25 mg total) by mouth 3 (three) times daily as needed for dizziness. (Patient taking differently: Take 25 mg by mouth 3 (three) times daily as needed for dizziness (2-3 tablets prn).) 30 tablet 0   Multiple Vitamin (MULTIVITAMIN WITH MINERALS) TABS tablet Take 1 tablet by mouth daily.       OLANZapine zydis (ZYPREXA) 5 MG disintegrating tablet Take 0.5 tablets (2.5 mg total) by mouth daily. (Patient taking differently: Take 2.5 mg by mouth at bedtime.) 30 tablet 1   ondansetron (ZOFRAN) 4 MG tablet TAKE 1 TABLET(4 MG) BY MOUTH EVERY 8 HOURS AS NEEDED FOR NAUSEA OR VOMITING 40 tablet 2   OVER THE COUNTER MEDICATION Take 2 tablets by mouth daily. Beet root       oxyCODONE (OXY IR/ROXICODONE) 5 MG immediate release tablet Take 1 tablet (5 mg total) by mouth every 4 (four) hours as needed for severe pain. 90 tablet 0   pantoprazole (PROTONIX) 40 MG tablet Take 1 tablet (40 mg total) by mouth daily. 90 tablet 3   polyethylene glycol (MIRALAX / GLYCOLAX) 17 g packet Take 17 g by mouth every evening.       potassium chloride SA (KLOR-CON M) 20 MEQ tablet Take 2 tablets (40 mEq total) by mouth 2 (two) times daily. 360 tablet 0   [EXPIRED]  protein supplement (RESOURCE BENEPROTEIN) 6 g POWD Take 1 Scoop (6 g total) by mouth 3 (three) times daily with meals. 540 g 0   Tetrahydrozoline HCl (VISINE OP) Place 1 drop into both eyes daily as needed (irritation).       VITAMIN D PO Take 1 capsule by mouth every evening.       vitamin E 180 MG (400 UNITS) capsule Take 400 IU daily x 1 week, then 400 IU BID (Patient taking differently: Take 400 Units by mouth daily.) 60 capsule 4   XTAMPZA ER 13.5 MG C12A Take 1 capsule by mouth in the morning and at bedtime. (every 12 hours) 60 capsule 0              Home: Home Living Family/patient expects to be discharged to:: Private residence Living Arrangements: Spouse/significant other Available Help at Discharge: Family, Available 24 hours/day Type of Home: House Home Access: Stairs to enter Secretary/administrator of Steps: 5 Entrance Stairs-Rails: Left Home Layout: Two level, Bed/bath upstairs, 1/2 bath on main level Alternate Level Stairs-Number of Steps: 18 Alternate Level Stairs-Rails: Right Bathroom Shower/Tub: Health visitor: Handicapped height Home Equipment: Occupational hygienist (4 wheels), Wheelchair - manual, Shower seat, Agricultural consultant (2 wheels), BSC/3in1 Additional Comments: considering getting a stair lift or moving to a more accessible home  Lives With: Spouse   Functional History: Prior Function Prior Level of Function : Needs assist Physical Assist : Mobility (physical), ADLs (physical) Mobility (physical): Stairs, Gait ADLs (physical): Bathing, Dressing, Toileting, Grooming Mobility Comments: uses rollator for mobility (downstairs) and RW upstairs. spouse recently assisting with stair mgmt ADLs Comments: uses RW to bathroom  upstairs, able to toilet and dress self, some assist with showers   Functional Status:  Mobility: Bed Mobility Overal bed mobility: Needs Assistance Bed Mobility: Supine to Sit, Rolling, Sidelying to Sit Rolling: Max assist, +2 for  physical assistance Sidelying to sit: Max assist, +2 for physical assistance General bed mobility comments: A with legs, to reach with RUE for left rail and to push up to sit Transfers Overall transfer level: Needs assistance Equipment used: 2 person hand held assist Transfers: Sit to/from Stand, Bed to chair/wheelchair/BSC Sit to Stand: Mod assist, +2 physical assistance Bed to/from chair/wheelchair/BSC transfer type:: Stand pivot Stand pivot transfers: Mod assist, +2 physical assistance General transfer comment: Pt Needed mod assist of 2 to power up from bed with assist using pad to rise. Pt needed assist to move left LE for transfer and mod verbal, tactile cues to pivot to recliner.   ADL: ADL Overall ADL's : Needs assistance/impaired Eating/Feeding: Minimal assistance Eating/Feeding Details (indicate cue type and reason): supported sitting Grooming: Moderate assistance Grooming Details (indicate cue type and reason): supported sitting Upper Body Bathing: Moderate assistance Upper Body Bathing Details (indicate cue type and reason): supported sitting Lower Body Bathing: Maximal assistance Lower Body Bathing Details (indicate cue type and reason): Mod A +2 sit<>stand Upper Body Dressing : Moderate assistance Upper Body Dressing Details (indicate cue type and reason): supported sitting Lower Body Dressing: Total assistance Lower Body Dressing Details (indicate cue type and reason): Mod A +2 sit<>stand Toilet Transfer: Moderate assistance, +2 for physical assistance, Stand-pivot Toilet Transfer Details (indicate cue type and reason): simulated bed>recliner on her right Toileting- Clothing Manipulation and Hygiene: Total assistance Toileting - Clothing Manipulation Details (indicate cue type and reason): Mod A +2 sit<>stand   Cognition: Cognition Overall Cognitive Status: Within Functional Limits for tasks assessed Orientation Level: Oriented X4 Cognition Arousal: Alert Behavior  During Therapy: WFL for tasks assessed/performed Overall Cognitive Status: Within Functional Limits for tasks assessed   Physical Exam: Blood pressure 117/68, pulse 82, temperature 98.5 F (36.9 C), temperature source Oral, resp. rate 18, height 5\' 2"  (1.575 m), weight 68 kg, SpO2 96%. Physical Exam Constitutional:      General: She is not in acute distress.    Comments: somnolent  HENT:     Head: Normocephalic and atraumatic.     Nose: Nose normal.     Mouth/Throat:     Mouth: Mucous membranes are moist.  Cardiovascular:     Rate and Rhythm: Normal rate and regular rhythm.  Pulmonary:     Effort: Pulmonary effort is normal. No respiratory distress.     Breath sounds: Normal breath sounds. No wheezing.  Abdominal:     General: Bowel sounds are normal. There is no distension.     Palpations: Abdomen is soft.     Tenderness: There is no abdominal tenderness.  Musculoskeletal:        General: No swelling.     Cervical back: Neck supple.  Skin:    General: Skin is warm and dry.  Neurological:     Comments: Pt difficult to wake up. No focal motor or sensory abnormalities. Moves all 4's. Can follow basic commands. Difficult to perform detailed neuro testing.   Psychiatric:        Mood and Affect: Mood normal.        Behavior: Behavior normal.        Lab Results Last 48 Hours        Results for orders placed or performed during the  hospital encounter of 06/03/23 (from the past 48 hour(s))  Glucose, capillary     Status: Abnormal    Collection Time: 06/08/23  5:11 PM  Result Value Ref Range    Glucose-Capillary 176 (H) 70 - 99 mg/dL      Comment: Glucose reference range applies only to samples taken after fasting for at least 8 hours.  Glucose, capillary     Status: Abnormal    Collection Time: 06/08/23  9:40 PM  Result Value Ref Range    Glucose-Capillary 269 (H) 70 - 99 mg/dL      Comment: Glucose reference range applies only to samples taken after fasting for at least 8  hours.  Basic metabolic panel     Status: Abnormal    Collection Time: 06/09/23  3:56 AM  Result Value Ref Range    Sodium 140 135 - 145 mmol/L    Potassium 4.1 3.5 - 5.1 mmol/L    Chloride 104 98 - 111 mmol/L    CO2 27 22 - 32 mmol/L    Glucose, Bld 117 (H) 70 - 99 mg/dL      Comment: Glucose reference range applies only to samples taken after fasting for at least 8 hours.    BUN 22 8 - 23 mg/dL    Creatinine, Ser 4.13 (H) 0.44 - 1.00 mg/dL    Calcium 8.4 (L) 8.9 - 10.3 mg/dL    GFR, Estimated >24 >40 mL/min      Comment: (NOTE) Calculated using the CKD-EPI Creatinine Equation (2021)      Anion gap 9 5 - 15      Comment: Performed at [facility], [address] [place], [place] [address]  CBC     Status: Abnormal    Collection Time: 06/09/23  3:56 AM  Result Value Ref Range    WBC 10.6 (H) 4.0 - 10.5 K/uL    RBC 2.32 (L) 3.87 - 5.11 MIL/uL    Hemoglobin 7.7 (L) 12.0 - 15.0 g/dL    HCT 53.6 (L) 64.4 - 46.0 %    MCV 96.1 80.0 - 100.0 fL    MCH 33.2 26.0 - 34.0 pg    MCHC 34.5 30.0 - 36.0 g/dL    RDW 03.4 (H) 74.2 - 15.5 %    Platelets 258 150 - 400 K/uL    nRBC 0.3 (H) 0.0 - 0.2 %      Comment: Performed at [facility], [address], [place], [place] [address]  Magnesium     Status: None    Collection Time: 06/09/23  3:56 AM  Result Value Ref Range    Magnesium 2.2 1.7 - 2.4 mg/dL      Comment: Performed at [facility], [address] [place], [place] [address]  Glucose, capillary     Status: Abnormal    Collection Time: 06/09/23  8:21 AM  Result Value Ref Range    Glucose-Capillary 103 (H) 70 - 99 mg/dL      Comment: Glucose reference range applies only to samples taken after fasting for at least 8 hours.  Glucose, capillary     Status: Abnormal    Collection Time: 06/09/23 12:25 PM  Result Value Ref Range    Glucose-Capillary 202 (H) 70 - 99 mg/dL      Comment: Glucose reference range applies only to samples taken after fasting for at least 8  hours.  Glucose, capillary     Status: Abnormal    Collection  Time: 06/09/23  4:30 PM  Result Value Ref Range    Glucose-Capillary 226 (H) 70 - 99 mg/dL      Comment: Glucose reference range applies only to samples taken after fasting for at least 8 hours.  Glucose, capillary     Status: Abnormal    Collection Time: 06/09/23 10:02 PM  Result Value Ref Range    Glucose-Capillary 143 (H) 70 - 99 mg/dL      Comment: Glucose reference range applies only to samples taken after fasting for at least 8 hours.  Glucose, capillary     Status: Abnormal    Collection Time: 06/10/23  7:26 AM  Result Value Ref Range    Glucose-Capillary 106 (H) 70 - 99 mg/dL      Comment: Glucose reference range applies only to samples taken after fasting for at least 8 hours.    *Note: Due to a large number of results and/or encounters for the requested time period, some results have not been displayed. A complete set of results can be found in Results Review.      Imaging Results (Last 48 hours)  No results found.         Blood pressure 117/68, pulse 82, temperature 98.5 F (36.9 C), temperature source Oral, resp. rate 18, height 5\' 2"  (1.575 m), weight 68 kg, SpO2 96%.   Medical Problem List and Plan: 1. Functional deficits secondary to debility after cardiac arrest and multiple related complications             -patient may shower             -ELOS/Goals: 12-14 days, min assist with PT, OT, SLP             -pt has episodes of fatigue/somnolence at baseline per husband. He says she does better in the later morning and early afternoon.  2.  Antithrombotics: -DVT/anticoagulation:  Mechanical:  Antiembolism stockings, knee (TED hose) Bilateral lower extremities (age indeterminate left posterior tib DVT). 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             -antiplatelet therapy: none   3. Pain  Management: Tylenol, Norco, oxycodone as needed -Lidoderm patch             -Oxycontin 10 mg q 12 hours             -observe for further sedation. Was somnolent this morning after receiving pain medication.  4. Mood/Behavior/Sleep: LCSW to evaluate and provide emotional support             -antipsychotic agents: Zyprexa 2.5 mg q HS             -Xanax prn   5. Neuropsych/cognition: This patient is capable of making decisions on his own behalf.   6. Skin/Wound Care: Routine skin care checks   7. Fluids/Electrolytes/Nutrition: Routine Is and Os and follow-up chemistries   8: Hypertension: monitor TID and prn (home Norvasc held)   9: Post-arrest ALI and PTA treatment for new metastatic brain lesion: on Decadron taper   10: Bilateral rib fractures due to chest compressions   11: V-fib cardiac arrest:  -continue Pacerone 400 mg daily for one week, then 200 mg daily on 10/08             -not a candidate for invasive intervention             -  K > 4; Mg > 2             -if patient needs diuretics outpatient due to lower extremity edema then cardiology will consider changing to a potassium sparing diuretic such as spironolactone 25 mg daily along with some potassium supplementation and magnesium             -follow-up with Dr. 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 [name]             -Pt has been seen by Palliative Care 13: Post-arrest encephalopathy -mental status appears to have returned to baseline at this time   14: Age-indeterminate DVT left posterior tibial veins: no AC due to radiation necrosis of brain met   15: Urinary retention: Foley in place             -continue urecholine             -voiding trial/PVRs/IC beginning tomorrow  16: AKI: BUN now normal and creatinine normalizing             -follow-up BMP   17: Anemia: ~3 gram drop in hemoglobin/no overt  bleeding             -follow-up CBC. Most recently has been ~7.7             -check Hemoccult X 3             -has hemorrhoids, constipated             -continue PPI, Pepcid   18: DM: A1c = 7.3%, also on Decadron             -start CBGs QID and SSI             -carb modified diet as tolerated   19: Prior post fossa craniotomy for tumor resection 12/02/2020: resection of progressive cerebellar mass by Dr. Venetia Maxon. 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 Has chronic intermittent dizziness>>meclizine prn   20: External hemorrhoids: continue topical hydrocortisone 2.5%   21: Intermittent constipation: check for impaction then decide on appropriate regimen       Milinda Antis, [name] 06/10/2023  I have personally performed a face to face diagnostic evaluation of this patient and formulated the key components of the plan.  Additionally, I have personally reviewed laboratory data, imaging studies, as well as relevant notes and concur with the physician assistant's documentation above.  The patient's status has not changed from the original H&P.  Any changes in documentation from the acute care chart have been noted above.  Ranelle Oyster, MD, [place]

## 2023-06-10 NOTE — Discharge Instructions (Signed)
Fasting Lab Work in 1 week, Choleterol profile, comprehensive metabolic profile and Magnesium Can be done while in rehab or at [facility] as outpatient.

## 2023-06-10 NOTE — Care Management Important Message (Signed)
Important Message  Patient Details  Name: Virginia [name] MRN: [id number] Date of Birth: 01/30/1956   Important Message Given:  Yes - Medicare IM     Sherilyn Banker 06/10/2023, 12:37 PM

## 2023-06-10 NOTE — H&P (Signed)
Physical Medicine and Rehabilitation Admission H&P   CC: Functional deficits secondary to out-of-hospital cardiac arrest  HPI: Virginia [name] is 67 year old female who suffered a + cardiac arrest. Presenting rhythm was apparently VF, but family felt that the arrest may have been brought on by a seizure. Estimated downtime greater than 20 minutes. On ED arrival patient was seen hemodynamically stable but emergently intubated for airway protection.   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  She had nevertheless responded well to recent treatment. She also has a history of prior DVT, hypertension, hyperlipidemia, tobacco abuse and anxiety. S/p craniotomy, resection of progressive cerebellar mass by Dr. Venetia Maxon on 12/02/2020.  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   Extubated on 9/25. Transitioned to oral amiodarone. Not a candidate for [facility] due to ? prior brain bleed while on Eliquis in setting of brain mets. Unclear reason for no pharmacologic DVT prophylaxis. TTE notes diminished EF at 45-50% with global hypokinesis - Cardiology agree she is not a candidate for invasive intervention and suggests continuing amiodarone. EEG 9/23 suggestive of severe diffuse encephalopathy with no seizure or epileptiform discharges noted - no active seizure activity appreciated since hospitalization - it is certainly possible that rather than seizure she was experiencing arrhythmia related syncope leading to her respiratory irregularities though she of course would be at risk for seizure activity as well - plan to continue Keppra indefinitely. Decadron taper for acute lung injury.  Per Dr. Barbaraann Cao note on 05/23/2023 neuro exam: Cranial nerves: noted dysarthria Motor: Dense dysmetria, coordination impaired bilaterally. Sensory: Intact to light touch Gait: Non ambulatory today. Pt and husband state she was ambulatory with RW/rollator and able to climb stairs prior to admission.  A1c = 7.3% start carb  modified diet and SSI. Voiding trial if not already done. Discussed anemia, DM and DVT prophylaxis (at minimum TED hose) with patient and her husband. Check stool for occult blood. The patient requires inpatient medicine and rehabilitation evaluations and services for ongoing dysfunction secondary to cardiac arrest and lung cancer with brain mets.  Review of Systems  Constitutional:  Positive for malaise/fatigue.  HENT: Negative.    Respiratory:  Negative for cough.   Cardiovascular:  Negative for chest pain.  Gastrointestinal: Negative.   Genitourinary:        In dwelling Foley for retention  Skin: Negative.   Neurological:  Positive for dizziness. Negative for headaches.  Psychiatric/Behavioral:  The patient does not have insomnia.    Past Medical History:  Diagnosis Date   Anemia    Anxiety    Concussion 09/28/2019   DVT (deep venous thrombosis) (HCC) 2021   L leg   Dyspnea    GERD (gastroesophageal reflux disease)    Hypercholesterolemia    per pt, she does not have elevated lipids   Hypertension    met lung ca dx'd 09/2019   mets to spine, hip and brain   PONV (postoperative nausea and vomiting)    Tobacco abuse    Past Surgical History:  Procedure Laterality Date   ABDOMINAL HYSTERECTOMY     partial/ left ovaries   APPLICATION OF CRANIAL NAVIGATION N/A 12/02/2020   Procedure: APPLICATION OF CRANIAL NAVIGATION;  Surgeon: Maeola Harman, MD;  Location: [facility] OR;  Service: Neurosurgery;  Laterality: N/A;   CHOLECYSTECTOMY     CRANIOTOMY N/A 12/02/2020   Procedure: Posterior fossa craniotomy for tumor resection with brainlab;  Surgeon: Maeola Harman, MD;  Location: [facility] OR;  Service: Neurosurgery;  Laterality: N/A;   DILATION AND CURETTAGE OF UTERUS     IR IMAGING GUIDED PORT INSERTION  10/23/2019   IR PATIENT EVAL TECH 0-60 MINS  11/21/2021   IR PATIENT EVAL TECH 0-60 MINS  11/24/2021   IR PATIENT EVAL TECH 0-60 MINS  11/29/2021   IR PATIENT EVAL TECH 0-60 MINS  12/04/2021   IR  PATIENT EVAL TECH 0-60 MINS  12/12/2021   IR PATIENT EVAL TECH 0-60 MINS  12/19/2021   IR PATIENT EVAL TECH 0-60 MINS  12/27/2021   IR PATIENT EVAL TECH 0-60 MINS  01/03/2022   IR PATIENT EVAL TECH 0-60 MINS  01/10/2022   IR PATIENT EVAL TECH 0-60 MINS  01/18/2022   IR PATIENT EVAL TECH 0-60 MINS  02/13/2022   IR PATIENT EVAL TECH 0-60 MINS  02/20/2022   IR RADIOLOGIST EVAL & MGMT  01/23/2022   IR REMOVAL TUN ACCESS W/ PORT W/O FL MOD SED  11/17/2021   KYPHOPLASTY N/A 03/15/2020   Procedure: Thoracic Eight KYPHOPLASTY;  Surgeon: Maeola Harman, MD;  Location: [facility] OR;  Service: Neurosurgery;  Laterality: N/A;  prone    LIPOMA EXCISION  2018   removed under left breast and right thigh.   TUBAL LIGATION     Family History  Problem Relation Age of Onset   Heart disease Father    Drug abuse Daughter    Drug abuse Son    Cancer Sister    Heart disease Brother    Heart attack Brother    Social History:  reports that she quit smoking about 10 years ago. Her smoking use included cigarettes. She has never used smokeless tobacco. She reports current alcohol use. She reports that she does not use drugs. Allergies: No Known Allergies Medications Prior to Admission  Medication Sig Dispense Refill   acetaminophen (TYLENOL) 500 MG tablet Take 500 mg by mouth 2 (two) times daily as needed for moderate pain.     albuterol (VENTOLIN HFA) 108 (90 Base) MCG/ACT inhaler INHALE 2 PUFFS INTO THE LUNGS EVERY 6 HOURS AS NEEDED FOR WHEEZING OR SHORTNESS OF BREATH 6.7 g 11   ALPRAZolam (XANAX) 0.25 MG tablet TAKE 1 TABLET(0.25 MG) BY MOUTH TWICE DAILY AS NEEDED FOR ANXIETY 60 tablet 0   amLODipine (NORVASC) 10 MG tablet TAKE 1 TABLET BY MOUTH EVERY DAY 90 tablet 3   Cyanocobalamin (VITAMIN B12 PO) Take 1 tablet by mouth daily. Gummies     dexamethasone (DECADRON) 1 MG tablet Take 3 tablets by mouth daily with breakfast for 7 days, THEN 2 tablets daily with breakfast for 7 days, THEN 1 tablet daily with breakfast for 7 days.  42 tablet 0   EPINEPHrine 0.3 mg/0.3 mL IJ SOAJ injection Inject 0.3 mg into the muscle as needed for anaphylaxis. 2 each 0   folic acid (FOLVITE) 1 MG tablet TAKE 1 TABLET BY MOUTH DAILY 90 tablet 1   furosemide (LASIX) 20 MG tablet Take 2 tablets (40 mg total) by mouth 2 times daily. 360 tablet 0   Glucosamine HCl (GLUCOSAMINE PO) Take 2 tablets by mouth daily.     hydrocortisone (ANUSOL-HC) 2.5 % rectal cream use in rectal area 2 to 3 times a day as needed (Patient taking differently: Place 1 Application rectally daily as needed for hemorrhoids.) 30 g PRN   LYSINE PO Take 2 tablets by mouth daily.     meclizine (ANTIVERT) 25 MG tablet Take 1 tablet (25 mg total) by mouth 3 (three) times daily as needed for  dizziness. (Patient taking differently: Take 25 mg by mouth 3 (three) times daily as needed for dizziness (2-3 tablets prn).) 30 tablet 0   Multiple Vitamin (MULTIVITAMIN WITH MINERALS) TABS tablet Take 1 tablet by mouth daily.     OLANZapine zydis (ZYPREXA) 5 MG disintegrating tablet Take 0.5 tablets (2.5 mg total) by mouth daily. (Patient taking differently: Take 2.5 mg by mouth at bedtime.) 30 tablet 1   ondansetron (ZOFRAN) 4 MG tablet TAKE 1 TABLET(4 MG) BY MOUTH EVERY 8 HOURS AS NEEDED FOR NAUSEA OR VOMITING 40 tablet 2   OVER THE COUNTER MEDICATION Take 2 tablets by mouth daily. Beet root     oxyCODONE (OXY IR/ROXICODONE) 5 MG immediate release tablet Take 1 tablet (5 mg total) by mouth every 4 (four) hours as needed for severe pain. 90 tablet 0   pantoprazole (PROTONIX) 40 MG tablet Take 1 tablet (40 mg total) by mouth daily. 90 tablet 3   polyethylene glycol (MIRALAX / GLYCOLAX) 17 g packet Take 17 g by mouth every evening.     potassium chloride SA (KLOR-CON M) 20 MEQ tablet Take 2 tablets (40 mEq total) by mouth 2 (two) times daily. 360 tablet 0   [EXPIRED] protein supplement (RESOURCE BENEPROTEIN) 6 g POWD Take 1 Scoop (6 g total) by mouth 3 (three) times daily with meals. 540 g 0    Tetrahydrozoline HCl (VISINE OP) Place 1 drop into both eyes daily as needed (irritation).     VITAMIN D PO Take 1 capsule by mouth every evening.     vitamin E 180 MG (400 UNITS) capsule Take 400 IU daily x 1 week, then 400 IU BID (Patient taking differently: Take 400 Units by mouth daily.) 60 capsule 4   XTAMPZA ER 13.5 MG C12A Take 1 capsule by mouth in the morning and at bedtime. (every 12 hours) 60 capsule 0      Home: Home Living Family/patient expects to be discharged to:: Private residence Living Arrangements: Spouse/significant other Available Help at Discharge: Family, Available 24 hours/day Type of Home: House Home Access: Stairs to enter Secretary/administrator of Steps: 5 Entrance Stairs-Rails: Left Home Layout: Two level, Bed/bath upstairs, 1/2 bath on main level Alternate Level Stairs-Number of Steps: 18 Alternate Level Stairs-Rails: Right Bathroom Shower/Tub: Health visitor: Handicapped height Home Equipment: Occupational hygienist (4 wheels), Wheelchair - manual, Shower seat, Agricultural consultant (2 wheels), BSC/3in1 Additional Comments: considering getting a stair lift or moving to a more accessible home  Lives With: Spouse   Functional History: Prior Function Prior Level of Function : Needs assist Physical Assist : Mobility (physical), ADLs (physical) Mobility (physical): Stairs, Gait ADLs (physical): Bathing, Dressing, Toileting, Grooming Mobility Comments: uses rollator for mobility (downstairs) and RW upstairs. spouse recently assisting with stair mgmt ADLs Comments: uses RW to bathroom upstairs, able to toilet and dress self, some assist with showers  Functional Status:  Mobility: Bed Mobility Overal bed mobility: Needs Assistance Bed Mobility: Supine to Sit, Rolling, Sidelying to Sit Rolling: Max assist, +2 for physical assistance Sidelying to sit: Max assist, +2 for physical assistance General bed mobility comments: A with legs, to reach with RUE for left  rail and to push up to sit Transfers Overall transfer level: Needs assistance Equipment used: 2 person hand held assist Transfers: Sit to/from Stand, Bed to chair/wheelchair/BSC Sit to Stand: Mod assist, +2 physical assistance Bed to/from chair/wheelchair/BSC transfer type:: Stand pivot Stand pivot transfers: Mod assist, +2 physical assistance General transfer comment: Pt Needed mod assist of  2 to power up from bed with assist using pad to rise. Pt needed assist to move left LE for transfer and mod verbal, tactile cues to pivot to recliner.      ADL: ADL Overall ADL's : Needs assistance/impaired Eating/Feeding: Minimal assistance Eating/Feeding Details (indicate cue type and reason): supported sitting Grooming: Moderate assistance Grooming Details (indicate cue type and reason): supported sitting Upper Body Bathing: Moderate assistance Upper Body Bathing Details (indicate cue type and reason): supported sitting Lower Body Bathing: Maximal assistance Lower Body Bathing Details (indicate cue type and reason): Mod A +2 sit<>stand Upper Body Dressing : Moderate assistance Upper Body Dressing Details (indicate cue type and reason): supported sitting Lower Body Dressing: Total assistance Lower Body Dressing Details (indicate cue type and reason): Mod A +2 sit<>stand Toilet Transfer: Moderate assistance, +2 for physical assistance, Stand-pivot Toilet Transfer Details (indicate cue type and reason): simulated bed>recliner on her right Toileting- Clothing Manipulation and Hygiene: Total assistance Toileting - Clothing Manipulation Details (indicate cue type and reason): Mod A +2 sit<>stand  Cognition: Cognition Overall Cognitive Status: Within Functional Limits for tasks assessed Orientation Level: Oriented X4 Cognition Arousal: Alert Behavior During Therapy: WFL for tasks assessed/performed Overall Cognitive Status: Within Functional Limits for tasks assessed  Physical Exam: Blood  pressure 117/68, pulse 82, temperature 98.5 F (36.9 C), temperature source Oral, resp. rate 18, height 5\' 2"  (1.575 m), weight 68 kg, SpO2 96%. Physical Exam Constitutional:      General: She is not in acute distress.    Comments: somnolent  HENT:     Head: Normocephalic and atraumatic.     Nose: Nose normal.     Mouth/Throat:     Mouth: Mucous membranes are moist.  Cardiovascular:     Rate and Rhythm: Normal rate and regular rhythm.  Pulmonary:     Effort: Pulmonary effort is normal. No respiratory distress.     Breath sounds: Normal breath sounds. No wheezing.  Abdominal:     General: Bowel sounds are normal. There is no distension.     Palpations: Abdomen is soft.     Tenderness: There is no abdominal tenderness.  Musculoskeletal:        General: No swelling.     Cervical back: Neck supple.  Skin:    General: Skin is warm and dry.  Neurological:     Comments: Pt difficult to wake up. No focal motor or sensory abnormalities. Moves all 4's. Can follow basic commands. Difficult to perform detailed neuro testing.   Psychiatric:        Mood and Affect: Mood normal.        Behavior: Behavior normal.     Results for orders placed or performed during the hospital encounter of 06/03/23 (from the past 48 hour(s))  Glucose, capillary     Status: Abnormal   Collection Time: 06/08/23  5:11 PM  Result Value Ref Range   Glucose-Capillary 176 (H) 70 - 99 mg/dL    Comment: Glucose reference range applies only to samples taken after fasting for at least 8 hours.  Glucose, capillary     Status: Abnormal   Collection Time: 06/08/23  9:40 PM  Result Value Ref Range   Glucose-Capillary 269 (H) 70 - 99 mg/dL    Comment: Glucose reference range applies only to samples taken after fasting for at least 8 hours.  Basic metabolic panel     Status: Abnormal   Collection Time: 06/09/23  3:56 AM  Result Value Ref Range   Sodium  140 135 - 145 mmol/L   Potassium 4.1 3.5 - 5.1 mmol/L   Chloride  104 98 - 111 mmol/L   CO2 27 22 - 32 mmol/L   Glucose, Bld 117 (H) 70 - 99 mg/dL    Comment: Glucose reference range applies only to samples taken after fasting for at least 8 hours.   BUN 22 8 - 23 mg/dL   Creatinine, Ser 9.14 (H) 0.44 - 1.00 mg/dL   Calcium 8.4 (L) 8.9 - 10.3 mg/dL   GFR, Estimated >78 >29 mL/min    Comment: (NOTE) Calculated using the CKD-EPI Creatinine Equation (2021)    Anion gap 9 5 - 15    Comment: Performed at [facility], [address] [place], [place] [address]  CBC     Status: Abnormal   Collection Time: 06/09/23  3:56 AM  Result Value Ref Range   WBC 10.6 (H) 4.0 - 10.5 K/uL   RBC 2.32 (L) 3.87 - 5.11 MIL/uL   Hemoglobin 7.7 (L) 12.0 - 15.0 g/dL   HCT 08.6 (L) 57.8 - 46.9 %   MCV 96.1 80.0 - 100.0 fL   MCH 33.2 26.0 - 34.0 pg   MCHC 34.5 30.0 - 36.0 g/dL   RDW 62.9 (H) 52.8 - 41.3 %   Platelets 258 150 - 400 K/uL   nRBC 0.3 (H) 0.0 - 0.2 %    Comment: Performed at [facility], [address] [place], [place] [address]  Magnesium     Status: None   Collection Time: 06/09/23  3:56 AM  Result Value Ref Range   Magnesium 2.2 1.7 - 2.4 mg/dL    Comment: Performed at [facility], [address], [place], [place] [address]  Glucose, capillary     Status: Abnormal   Collection Time: 06/09/23  8:21 AM  Result Value Ref Range   Glucose-Capillary 103 (H) 70 - 99 mg/dL    Comment: Glucose reference range applies only to samples taken after fasting for at least 8 hours.  Glucose, capillary     Status: Abnormal   Collection Time: 06/09/23 12:25 PM  Result Value Ref Range   Glucose-Capillary 202 (H) 70 - 99 mg/dL    Comment: Glucose reference range applies only to samples taken after fasting for at least 8 hours.  Glucose, capillary     Status: Abnormal   Collection Time: 06/09/23  4:30 PM  Result Value Ref Range   Glucose-Capillary 226 (H) 70 - 99 mg/dL    Comment: Glucose reference range applies only to samples taken after fasting  for at least 8 hours.  Glucose, capillary     Status: Abnormal   Collection Time: 06/09/23 10:02 PM  Result Value Ref Range   Glucose-Capillary 143 (H) 70 - 99 mg/dL    Comment: Glucose reference range applies only to samples taken after fasting for at least 8 hours.  Glucose, capillary     Status: Abnormal   Collection Time: 06/10/23  7:26 AM  Result Value Ref Range   Glucose-Capillary 106 (H) 70 - 99 mg/dL    Comment: Glucose reference range applies only to samples taken after fasting for at least 8 hours.   *Note: Due to a large number of results and/or encounters for the requested time period, some results have not been displayed. A complete set of results can be found in Results Review.   No results found.    Blood pressure 117/68, pulse 82, temperature 98.5 F (  36.9 C), temperature source Oral, resp. rate 18, height 5\' 2"  (1.575 m), weight 68 kg, SpO2 96%.  Medical Problem List and Plan: 1. Functional deficits secondary to debility after cardiac arrest and multiple related complications  -patient may shower  -ELOS/Goals: 12-14 days, min assist with PT, OT, SLP  -pt has episodes of fatigue/somnolence at baseline per husband. He says she does better in the later morning and early afternoon.  2.  Antithrombotics: -DVT/anticoagulation:  Mechanical:  Antiembolism stockings, knee (TED hose) Bilateral lower extremities (age indeterminate left calf DVT). 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  -antiplatelet therapy: none  3. Pain Management: Tylenol, Norco, oxycodone as needed -Lidoderm patch  -Oxycontin 10 mg q 12 hours  -observe for further sedation. Was somnolent this morning after receiving pain medication.  4. Mood/Behavior/Sleep: LCSW to evaluate and provide emotional support  -antipsychotic agents: Zyprexa 2.5 mg q HS  -Xanax prn  5. Neuropsych/cognition: This  patient is capable of making decisions on his own behalf.  6. Skin/Wound Care: Routine skin care checks   7. Fluids/Electrolytes/Nutrition: Routine Is and Os and follow-up chemistries  8: Hypertension: monitor TID and prn (home Norvasc held)  9: Post-arrest ALI and PTA treatment for new metastatic brain lesion: on Decadron taper  10: Bilateral rib fractures due to chest compressions  11: V-fib cardiac arrest:  -continue Pacerone 400 mg daily for one week, then 200 mg daily on 10/08  -not a candidate for invasive intervention  -K > 4; Mg > 2  -if patient needs diuretics outpatient due to lower extremity edema then cardiology will consider changing to a potassium sparing diuretic such as spironolactone 25 mg daily along with some potassium supplementation and magnesium  -follow-up with Dr. Lynnette Caffey 3 weeks after D/C from CIR  12: Stage IV lung cancer/mets to brain:  -continue Keppra 1000 mg BID  -follow-up with Dr. [name]  -Pt has been seen by Palliative Care 13: Post-arrest encephalopathy -mental status appears to have returned to baseline at this time  14: Age-indeterminate DVT left posterior tibial veins: no AC due to radiation necrosis of brain met  15: Urinary retention: Foley in place  -continue urecholine  -voiding trial/PVRs/IC beginning tomorrow  16: AKI: BUN now normal and creatinine normalizing  -follow-up BMP  17: Anemia: ~3 gram drop in hemoglobin/no overt bleeding  -follow-up CBC. Most recently has been ~7.7  -check Hemoccult X 3  -has hemorrhoids, constipated  -continue PPI, Pepcid  18: DM: A1c = 7.3%, also on Decadron  -start CBGs QID and SSI  -carb modified diet as tolerated  19: Prior post fossa craniotomy for tumor resection 12/02/2020: resection of progressive cerebellar mass by Dr. Venetia Maxon. 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 Has chronic intermittent dizziness>>meclizine prn  20: External hemorrhoids: continue topical hydrocortisone 2.5%  21: Intermittent  constipation: check for impaction then decide on appropriate regimen     Milinda Antis, [name] 06/10/2023

## 2023-06-10 NOTE — TOC Transition Note (Signed)
Transition of Care [facility]) - CM/SW Discharge Note   Patient Details  Name: Virginia [name] MRN: [id number] Date of Birth: Oct 29, 1955  [facility] CM/SW Contact:  Gala Lewandowsky, RN Phone Number: 06/10/2023, 11:31 AM   Clinical Narrative: Patient will admit to CIR today. No further needs from Case Manager at this time.    Final next level of care: IP Rehab Facility Barriers to Discharge: No Barriers Identified   Discharge Plan and Services Additional resources added to the After Visit Summary for      Post Acute Care Choice: IP Rehab             Social Determinants of Health (SDOH) Interventions SDOH Screenings   Food Insecurity: No Food Insecurity (06/06/2023)  Housing: Patient Declined (06/06/2023)  Transportation Needs: No Transportation Needs (06/06/2023)  Utilities: Not At Risk (06/06/2023)  Depression (PHQ2-9): Low Risk  (11/29/2021)  Tobacco Use: Medium Risk (06/03/2023)   Readmission Risk Interventions    06/06/2023    3:51 PM 05/07/2023    1:35 PM  Readmission Risk Prevention Plan  Transportation Screening Complete Complete  PCP or Specialist Appt within 3-5 Days  Complete  HRI or Home Care Consult  Complete  Social Work Consult for Recovery Care Planning/Counseling  Complete  Palliative Care Screening  Not Applicable  Medication Review Oceanographer) Complete Complete  HRI or Home Care Consult Complete   SW Recovery Care/Counseling Consult Complete   Palliative Care Screening Complete   Skilled Nursing Facility Not Applicable

## 2023-06-10 NOTE — PMR Pre-admission (Signed)
PMR Admission Coordinator Pre-Admission Assessment  Patient: Virginia [name] is an 67 y.o., female MRN: [id number] DOB: November 09, 1955 Height: 5\' 2"  (157.5 cm) Weight: 68 kg  Insurance Information HMO:     PPO:      PCP:      IPA:      80/20:   yes   OTHER:  PRIMARY: medicare AB      Policy#: [id number]   Subscriber:  Phone#: Verified online    Fax#:  Pre-Cert#:       Employer:  Benefits:  Phone #:      Name:  Eff. Date: Parts A  and B effective 07/11/22 Deduct: $1632      Out of Pocket Max:  None      Life Max: N/A  CIR: 100%      SNF: 100 days Outpatient: 80%     Co-Pay: 20% Home Health: 100%      Co-Pay: none DME: 80%     Co-Pay: 20% Providers: patient's choice Providers: in network  SECONDARY: [employer]       Policy#: [id number]      Phone#:   Financial Counselor:       Phone#:   The "Data Collection Information Summary" for patients in Inpatient Rehabilitation Facilities with attached "Privacy Act Statement-Health Care Records" was provided and verbally reviewed with: Patient and Family  Emergency Contact Information Contact Information     Name Relation Home Work Mobile   Virginia [name] Spouse [phone]   Tama Headings Daughter [phone]  [phone]      Other Contacts   None on File     Current Medical History  Patient Admitting Diagnosis: Cardiac Arrest  History of Present Illness: Pt. 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  She received 5 shocks via EMS for reported VF prior to her arrival and 2 rounds of epinephrine.  On ED arrival she was actually hemodynamically stable but was emergently intubated for airway protection.  Family reported concern that her arrest may have been preceded by seizure activity.She was initially  admitted to the ICU and cared for by PCCM.  She was able to be rapidly weaned from the ventilator and extubated 06/05/2023 at 08:28. She she appeared to be mentally intact with no focal deficits following extubation. 2D echo demonstrated mild LV dysfunction EF 45 to 50% with no valvular abnormalities. Plan was for medical management given her end-stage lung cancer felt not a candidate for coronary angiography with PCI. Started on amiodarone. To admission she had been on Lasix 40 mg twice daily and K-Dur 40 mEq twice daily but no mag supplement. Initial EKG showed narrow complex rhythm 87bpm, RVH pattern with new S1, Q3, T3 pattern, ST depression noted II, III, V6. Q wave in III is known but S1, T3 were new from last tracing (logged 05/14/23 with scan dated 05/03/23). QTc , one PVC. P waves extremely difficult to discern - either not present or very low amplitude in V3 with long first degree AVB. She also had this for a period of time in the ED before more clear P waves emerged, with NSR and occasional PVCs, couplets, triplets, and short runs NSVT up to 4-5 beats. Suspect arrhythmias were related to metabolic derangements. VF arrest felt to be related to metabolic derangements with cesium 1.4,  potassium 3.4 and calcium 6.76 ionized calcium 1.07. Deemed not to be a candidate for cath/PCI due to history of brain mets with head bleed in the past. Conservative management planned. Per Cardiology, amiodarone 400 mg twice daily until 10/8 and then transition to 200 mg daily. Will keep her on this for few weeks and then consider insisting her over to metoprolol. Pt. Will need to follow up with her oncologist once discharged from CIR. PT. Seen by PT/OT/SLP and they recommend CIR to assist return to PLOF.     Patient's medical record from [facility]  has been reviewed by the rehabilitation admission coordinator and physician.  Past Medical History  Past Medical History:  Diagnosis Date   Anemia     Anxiety    Concussion 09/28/2019   DVT (deep venous thrombosis) (HCC) 2021   L leg   Dyspnea    GERD (gastroesophageal reflux disease)    Hypercholesterolemia    per pt, she does not have elevated lipids   Hypertension    met lung ca dx'd 09/2019   mets to spine, hip and brain   PONV (postoperative nausea and vomiting)    Tobacco abuse     Has the patient had major surgery during 100 days prior to admission? No  Family History   family history includes Cancer in her sister; Drug abuse in her daughter and son; Heart attack in her brother; Heart disease in her brother and father.  Current Medications  Current Facility-Administered Medications:    acetaminophen (TYLENOL) tablet 650 mg, 650 mg, Oral, Q6H PRN, Lonia Blood, MD   ALPRAZolam Prudy Feeler) tablet 0.125-0.25 mg, 0.125-0.25 mg, Oral, TID PRN, Lorin Glass, MD   amiodarone (PACERONE) tablet 400 mg, 400 mg, Oral, BID, 400 mg at 06/09/23 2133 **FOLLOWED BY** [START ON 06/11/2023] amiodarone (PACERONE) tablet 400 mg, 400 mg, Oral, Daily **FOLLOWED BY** [START ON 06/18/2023] amiodarone (PACERONE) tablet 200 mg, 200 mg, Oral, Daily, Dunn, Dayna N, [name]   bethanechol (URECHOLINE) tablet 10 mg, 10 mg, Oral, TID, Lonia Blood, MD, 10 mg at 06/09/23 2135   bisacodyl (DULCOLAX) suppository 10 mg, 10 mg, Rectal, Once, Lonia Blood, MD   bisacodyl (DULCOLAX) suppository 10 mg, 10 mg, Rectal, Daily PRN, Lonia Blood, MD   Chlorhexidine Gluconate Cloth 2 % PADS 6 each, 6 each, Topical, Daily, Lorin Glass, MD, 6 each at 06/09/23 1610   dexamethasone (DECADRON) tablet 2 mg, 2 mg, Oral, Daily, 2 mg at 06/09/23 0928 **FOLLOWED BY** [START ON 06/14/2023] dexamethasone (DECADRON) tablet 1 mg, 1 mg, Oral, Daily, Jetty Duhamel T, MD   docusate sodium (COLACE) capsule 100 mg, 100 mg, Oral, BID, Jetty Duhamel T, MD, 100 mg at 06/09/23 9604   famotidine (PEPCID) tablet 20 mg, 20 mg, Oral, Daily, Jetty Duhamel T, MD, 20 mg  at 06/09/23 5409   feeding supplement (ENSURE ENLIVE / ENSURE PLUS) liquid 237 mL, 237 mL, Oral, BID BM, Yong Channel C, NP, 237 mL at 06/09/23 1406   HYDROcodone-acetaminophen (NORCO) 7.5-325 MG per tablet 1 tablet, 1 tablet, Oral, Q6H PRN, Lonia Blood, MD   hydrocortisone-pramoxine (PROCTOFOAM-HC) rectal foam 1 applicator, 1 applicator, Rectal, QID PRN, Lonia Blood, MD   levETIRAcetam (KEPPRA) tablet 1,000 mg, 1,000 mg, Oral, BID, Jetty Duhamel T, MD, 1,000 mg at 06/09/23 2133   lidocaine (LIDODERM) 5 % 1 patch, 1 patch, Transdermal, Q24H, John Giovanni, MD   meclizine (ANTIVERT) tablet 25 mg, 25 mg, Oral, TID PRN, Jimmey Ralph,  Broadus John, NP   OLANZapine (ZYPREXA) tablet 2.5 mg, 2.5 mg, Oral, QHS, Yong Channel C, NP, 2.5 mg at 06/09/23 2134   ondansetron (ZOFRAN) injection 4 mg, 4 mg, Intravenous, Q6H PRN, Tiburcio Pea, Whitney D, NP, 4 mg at 06/06/23 1640   Oral care mouth rinse, 15 mL, Mouth Rinse, 4 times per day, Lorin Glass, MD, 15 mL at 06/09/23 2136   Oral care mouth rinse, 15 mL, Mouth Rinse, PRN, Lorin Glass, MD   oxyCODONE (Oxy IR/ROXICODONE) immediate release tablet 5-10 mg, 5-10 mg, Oral, Q4H PRN, Lonia Blood, MD, 10 mg at 06/10/23 4540   oxyCODONE (OXYCONTIN) 12 hr tablet 10 mg, 10 mg, Oral, Q12H, Ulice Bold, NP, 10 mg at 06/09/23 2134   pantoprazole (PROTONIX) EC tablet 40 mg, 40 mg, Oral, Daily, Jetty Duhamel T, MD, 40 mg at 06/09/23 0928   polyethylene glycol (MIRALAX / GLYCOLAX) packet 17 g, 17 g, Oral, Daily, Lonia Blood, MD, 17 g at 06/09/23 9811   senna (SENOKOT) tablet 8.6 mg, 1 tablet, Oral, Q12H PRN, Ulice Bold, NP, 8.6 mg at 06/08/23 2144  Patients Current Diet:  Diet Order             Diet regular Room service appropriate? Yes with Assist; Fluid consistency: Thin  Diet effective now                   Precautions / Restrictions Precautions Precautions: Fall Restrictions Weight Bearing Restrictions: No    Has the patient had 2 or more falls or a fall with injury in the past year? Yes  Prior Activity Level Limited Community (1-2x/wk): Pt. went out for appts  Prior Functional Level Self Care: Did the patient need help bathing, dressing, using the toilet or eating? Needed some help  Indoor Mobility: Did the patient need assistance with walking from room to room (with or without device)? Needed some help  Stairs: Did the patient need assistance with internal or external stairs (with or without device)? Needed some help  Functional Cognition: Did the patient need help planning regular tasks such as shopping or remembering to take medications? Needed some help  Patient Information Are you of Hispanic, Latino/a,or Spanish origin?: A. No, not of Hispanic, Latino/a, or Spanish origin What is your race?: A. White Do you need or want an interpreter to communicate with a doctor or health care staff?: 0. No  Patient's Response To:  Health Literacy and Transportation Is the patient able to respond to health literacy and transportation needs?: Yes Health Literacy - How often do you need to have someone help you when you read instructions, pamphlets, or other written material from your doctor or pharmacy?: Never In the past 12 months, has lack of transportation kept you from medical appointments or from getting medications?: No In the past 12 months, has lack of transportation kept you from meetings, work, or from getting things needed for daily living?: No  Journalist, newspaper / Equipment Home Equipment: Occupational hygienist (4 wheels), Wheelchair - manual, Shower seat, Agricultural consultant (2 wheels), BSC/3in1  Prior Device Use: Indicate devices/aids used by the patient prior to current illness, exacerbation or injury? Manual wheelchair and Walker  Current Functional Level Cognition  Overall Cognitive Status: Within Functional Limits for tasks assessed Orientation Level: Oriented X4    Extremity  Assessment (includes Sensation/Coordination)  Upper Extremity Assessment: Defer to OT evaluation LUE Deficits / Details: old deficits since brain surgery--has movement in all joints just not normal  and mostly 2/5 strength LUE Coordination: decreased gross motor, decreased fine motor  Lower Extremity Assessment: LLE deficits/detail LLE Deficits / Details: Deficits since brain surgery, grossly 2-/5    ADLs  Overall ADL's : Needs assistance/impaired Eating/Feeding: Minimal assistance Eating/Feeding Details (indicate cue type and reason): supported sitting Grooming: Moderate assistance Grooming Details (indicate cue type and reason): supported sitting Upper Body Bathing: Moderate assistance Upper Body Bathing Details (indicate cue type and reason): supported sitting Lower Body Bathing: Maximal assistance Lower Body Bathing Details (indicate cue type and reason): Mod A +2 sit<>stand Upper Body Dressing : Moderate assistance Upper Body Dressing Details (indicate cue type and reason): supported sitting Lower Body Dressing: Total assistance Lower Body Dressing Details (indicate cue type and reason): Mod A +2 sit<>stand Toilet Transfer: Moderate assistance, +2 for physical assistance, Stand-pivot Toilet Transfer Details (indicate cue type and reason): simulated bed>recliner on her right Toileting- Clothing Manipulation and Hygiene: Total assistance Toileting - Clothing Manipulation Details (indicate cue type and reason): Mod A +2 sit<>stand    Mobility  Overal bed mobility: Needs Assistance Bed Mobility: Supine to Sit, Rolling, Sidelying to Sit Rolling: Max assist, +2 for physical assistance Sidelying to sit: Max assist, +2 for physical assistance General bed mobility comments: A with legs, to reach with RUE for left rail and to push up to sit    Transfers  Overall transfer level: Needs assistance Equipment used: 2 person hand held assist Transfers: Sit to/from Stand, Bed to  chair/wheelchair/BSC Sit to Stand: Mod assist, +2 physical assistance Bed to/from chair/wheelchair/BSC transfer type:: Stand pivot Stand pivot transfers: Mod assist, +2 physical assistance General transfer comment: Pt Needed mod assist of 2 to power up from bed with assist using pad to rise. Pt needed assist to move left LE for transfer and mod verbal, tactile cues to pivot to recliner.    Ambulation / Gait / Stairs / Engineer, drilling / Balance Dynamic Sitting Balance Sitting balance - Comments: Losing balance all directions needing constant support mod to total assist Balance Overall balance assessment: Needs assistance Sitting-balance support: Bilateral upper extremity supported, Feet supported Sitting balance-Leahy Scale: Poor Sitting balance - Comments: Losing balance all directions needing constant support mod to total assist Postural control: Left lateral lean Standing balance support: Bilateral upper extremity supported Standing balance-Leahy Scale: Poor Standing balance comment: Bil HHA with left lean +2 mod assist    Special needs/care consideration Special service needs Pt will need to follow up with oncologist as outpatient   Previous Home Environment (from acute therapy documentation) Living Arrangements: Spouse/significant other  Lives With: Spouse Available Help at Discharge: Family, Available 24 hours/day Type of Home: House Home Layout: Two level, Bed/bath upstairs, 1/2 bath on main level Alternate Level Stairs-Rails: Right Alternate Level Stairs-Number of Steps: 18 Home Access: Stairs to enter Entrance Stairs-Rails: Left Entrance Stairs-Number of Steps: 5 Bathroom Shower/Tub: Health visitor: Handicapped height Home Care Services: No Additional Comments: considering getting a stair lift or moving to a more accessible home  Discharge Living Setting Plans for Discharge Living Setting: Patient's home Type of Home at Discharge:  House Discharge Home Layout: Two level, 1/2 bath on main level Alternate Level Stairs-Rails: Right Alternate Level Stairs-Number of Steps: 18 Discharge Home Access: Stairs to enter Entrance Stairs-Rails: Left Entrance Stairs-Number of Steps: 5 Discharge Bathroom Shower/Tub: Walk-in shower Discharge Bathroom Toilet: Handicapped height Discharge Bathroom Accessibility: Yes How Accessible: Accessible via wheelchair, Accessible via walker Does the patient have any  problems obtaining your medications?: No  Social/Family/Support Systems Patient Roles: Spouse Contact Information: Keiona Gotay Anticipated Caregiver: [phone] Ability/Limitations of Caregiver: 24/7 min A Caregiver Availability: 24/7 Discharge Plan Discussed with Primary Caregiver: Yes Is Caregiver In Agreement with Plan?: Yes Does Caregiver/Family have Issues with Lodging/Transportation while Pt is in Rehab?: No  Goals Patient/Family Goal for Rehab: PT/OT/SLP min A Expected length of stay: 12-14 days Pt/Family Agrees to Admission and willing to participate: Yes Program Orientation Provided & Reviewed with Pt/Caregiver Including Roles  & Responsibilities: Yes  Decrease burden of Care through IP rehab admission: not anticipated   Possible need for SNF placement upon discharge: not anticipated   Patient Condition: I have reviewed medical records from [facility], spoken with CM, and patient. I met with patient at the bedside for inpatient rehabilitation assessment.  Patient will benefit from ongoing PT, OT, and SLP, can actively participate in 3 hours of therapy a day 5 days of the week, and can make measurable gains during the admission.  Patient will also benefit from the coordinated team approach during an Inpatient Acute Rehabilitation admission.  The patient will receive intensive therapy as well as Rehabilitation physician, nursing, social worker, and care management interventions.  Due to bladder  management, bowel management, safety, skin/wound care, disease management, medication administration, pain management, and patient education the patient requires 24 hour a day rehabilitation nursing.  The patient is currently mod+2-max+2  with mobility and basic ADLs.  Discharge setting and therapy post discharge at home with home health is anticipated.  Patient has agreed to participate in the Acute Inpatient Rehabilitation Program and will admit today.  Preadmission Screen Completed By:  Jeronimo Greaves, 06/10/2023 10:34 AM ______________________________________________________________________   Discussed status with Dr. Riley Kill on 06/10/23 at 930 and received approval for admission today.  Admission Coordinator:  Jeronimo Greaves, CCC-SLP, time 1045/Date 06/10/23   Assessment/Plan: Diagnosis: debility after cardiac arrest Does the need for close, 24 hr/day Medical supervision in concert with the patient's rehab needs make it unreasonable for this patient to be served in a less intensive setting? Yes Co-Morbidities requiring supervision/potential complications: lung cancer with mets to brain, dvt, anxiety disorder, htn Due to bladder management, bowel management, safety, skin/wound care, disease management, medication administration, pain management, and patient education, does the patient require 24 hr/day rehab nursing? Yes Does the patient require coordinated care of a physician, rehab nurse, PT, OT, and SLP to address physical and functional deficits in the context of the above medical diagnosis(es)? Yes Addressing deficits in the following areas: balance, endurance, locomotion, strength, transferring, bowel/bladder control, bathing, dressing, feeding, grooming, toileting, cognition, and psychosocial support Can the patient actively participate in an intensive therapy program of at least 3 hrs of therapy 5 days a week? Yes The potential for patient to make measurable gains while on inpatient rehab is  excellent Anticipated functional outcomes upon discharge from inpatient rehab: min assist PT, min assist OT, min assist SLP Estimated rehab length of stay to reach the above functional goals is: 12-14 days Anticipated discharge destination: Home 10. Overall Rehab/Functional Prognosis: excellent   MD Signature: Ranelle Oyster, MD, [facility] Physical Medicine & Rehabilitation Medical Director Rehabilitation Services 06/10/2023

## 2023-06-10 NOTE — Progress Notes (Signed)
Rounding Note    Patient Name: Virginia [name] Date of Encounter: 06/10/2023  [facility] Cardiologist: None   Subjective   Feeling ok, hx chronic probs w/ K+,   Inpatient Medications    Scheduled Meds:  amiodarone  400 mg Oral BID   Followed by   Melene Muller ON 06/11/2023] amiodarone  400 mg Oral Daily   Followed by   Melene Muller ON 06/18/2023] amiodarone  200 mg Oral Daily   bethanechol  10 mg Oral TID   bisacodyl  10 mg Rectal Once   Chlorhexidine Gluconate Cloth  6 each Topical Daily   dexamethasone  2 mg Oral Daily   Followed by   Melene Muller ON 06/14/2023] dexamethasone  1 mg Oral Daily   docusate sodium  100 mg Oral BID   famotidine  20 mg Oral Daily   feeding supplement  237 mL Oral BID BM   levETIRAcetam  1,000 mg Oral BID   lidocaine  1 patch Transdermal Q24H   OLANZapine  2.5 mg Oral QHS   mouth rinse  15 mL Mouth Rinse 4 times per day   oxyCODONE  10 mg Oral Q12H   pantoprazole  40 mg Oral Daily   polyethylene glycol  17 g Oral Daily   Continuous Infusions:  PRN Meds: acetaminophen, ALPRAZolam, bisacodyl, HYDROcodone-acetaminophen, hydrocortisone-pramoxine, meclizine, ondansetron (ZOFRAN) IV, mouth rinse, oxyCODONE, senna   Vital Signs    Vitals:   06/09/23 0018 06/09/23 1453 06/09/23 2120 06/10/23 0341  BP: 122/69 127/80 132/89 117/68  Pulse: 74 99 78 82  Resp: 16 18 18 18   Temp: 97.8 F (36.6 C) 99.1 F (37.3 C) 98.7 F (37.1 C) 98.5 F (36.9 C)  TempSrc: Oral Oral Oral Oral  SpO2: 97% 99% 97% 96%  Weight:      Height:        Intake/Output Summary (Last 24 hours) at 06/10/2023 0818 Last data filed at 06/10/2023 0310 Gross per 24 hour  Intake --  Output 1650 ml  Net -1650 ml      06/08/2023    4:22 AM 06/07/2023    4:00 AM 06/06/2023    4:12 AM  Last 3 Weights  Weight (lbs) 150 lb 148 lb 147 lb 14.9 oz  Weight (kg) 68.04 kg 67.132 kg 67.1 kg      Telemetry    NSR - Personally Reviewed  ECG    None today - Personally  Reviewed  Physical Exam   Vitals:   06/09/23 2120 06/10/23 0341  BP: 132/89 117/68  Pulse: 78 82  Resp: 18 18  Temp: 98.7 F (37.1 C) 98.5 F (36.9 C)  SpO2: 97% 96%    GEN: No acute distress.   Neck: No JVD Cardiac: RRR, no murmurs, rubs, or gallops.  Respiratory: Clear to auscultation bilaterally. ON O2 GI: Soft, nontender, non-distended  MS: No edema; No deformity. Neuro:  Nonfocal  Psych: Normal affect   Labs    High Sensitivity Troponin:   Recent Labs  Lab 06/03/23 1453 06/03/23 1844  TROPONINIHS 157* 1,615*     Chemistry Recent Labs  Lab 06/03/23 1453 06/03/23 1618 06/04/23 0706 06/05/23 0311 06/06/23 0436 06/08/23 0422 06/09/23 0356  NA 137   < > 138   < > 140 142 140  K 3.4*   < > 3.3*   < > 3.3* 3.6 4.1  CL 104  --  101   < > 107 101 104  CO2 21*  --  23   < >  26 29 27   GLUCOSE 288*  --  85   < > 111* 126* 117*  BUN 30*  --  44*   < > 31* 28* 22  CREATININE 1.04*  --  1.33*   < > 1.25* 1.15* 1.02*  CALCIUM 6.7*  --  7.5*   < > 7.4* 8.6* 8.4*  MG  --    < >  --    < > 2.3 1.7 2.2  PROT 4.2*  --  5.6*  --   --  4.5*  --   ALBUMIN 2.0*  --  2.5*  --   --  2.0*  --   AST 252*  --  120*  --   --  20  --   ALT 237*  --  244*  --   --  57*  --   ALKPHOS 99  --  101  --   --  59  --   BILITOT 0.5  --  0.6  --   --  0.2*  --   GFRNONAA 60*  --  44*   < > 48* 53* >60  ANIONGAP 12  --  14   < > 7 12 9    < > = values in this interval not displayed.    Lipids  Recent Labs  Lab 06/05/23 0311  TRIG 134    Hematology Recent Labs  Lab 06/06/23 0436 06/08/23 0422 06/09/23 0356  WBC 10.0 10.2 10.6*  RBC 2.23* 2.34* 2.32*  HGB 7.6* 7.6* 7.7*  HCT 21.9* 22.8* 22.3*  MCV 98.2 97.4 96.1  MCH 34.1* 32.5 33.2  MCHC 34.7 33.3 34.5  RDW 16.8* 16.4* 16.5*  PLT 198 240 258   Thyroid  Recent Labs  Lab 06/08/23 0422  TSH 1.690    BNPNo results for input(s): "BNP", "PROBNP" in the last 168 hours.  DDimer  Recent Labs  Lab 06/08/23 0422  DDIMER  3.62*     Radiology    No results found.  Cardiac Studies  TTE 06/06/2023 1. Left ventricular ejection fraction, by estimation, is 45 to 50%. The  left ventricle has mildly decreased function. The left ventricle  demonstrates global hypokinesis. Left ventricular diastolic parameters are  consistent with Grade I diastolic dysfunction (impaired relaxation).   2. Right ventricular systolic function is normal. The right ventricular  size is normal.   3. A small pericardial effusion is present. The pericardial effusion is  circumferential.   4. The mitral valve is normal in structure. Trivial mitral valve  regurgitation. No evidence of mitral stenosis.   5. The aortic valve is normal in structure. Aortic valve regurgitation is  mild to moderate. No aortic stenosis is present.   6. There is borderline dilatation of the ascending aorta, measuring 38  mm.   7. The inferior vena cava is normal in size with greater than 50%  respiratory variability, suggesting right atrial pressure of 3 mmHg.    Patient Profile  TRACEY HERMANCE is a 67 y.o. 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 The patient underwent CPR despite being DNR/DNI for almost 25 minutes.  The patient required intubation. She was weaned and extubated. She was transferred to the floor.  - An echocardiogram here demonstrated mild LV dysfunction with ejection fraction of 45 to 50% and no significant valvular abnormalities. She was seen by Dr. Lynnette Caffey and after a family discussion, they agreed that she is not a candidate for coronary angiography or PCI.  She can continue amiodarone  load. K>4, Mg>2.  - will recheck BMET  Diuretic use - pta was on Lasix 40 mg bid and Kdur 40 meq bid, not on Mg supplement - K+ on admit 3.4, Mg 1.4 s/p supplement - currently not on Lasix or daily K+ - discuss w/ MD changing to K+ sparing diuretic, ?spiro 25 and Kdur 20 with Mag-Ox 200 mg qd   For questions or updates, please contact [facility] Please consult [email or web address] for contact info under        Signed, Theodore Demark, [name]  06/10/2023, 8:18 AM

## 2023-06-10 NOTE — Discharge Summary (Signed)
DISCHARGE SUMMARY  Virginia [name]  MR#: [id number]  DOB:[birth year]  Date of Admission: 06/03/2023 Date of Discharge: 06/10/2023  Attending Physician:Elzy Tomasello Silvestre Gunner, MD  Patient's BJY:[id number], Luanna Cole, MD  Disposition: D/C to CIR   Follow-up Appts: To be determined at the time the patient is able to be d/c from CIR.   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  She received 5 shocks via EMS for reported VF prior to her arrival and 2 rounds of epinephrine.  On ED arrival she was actually hemodynamically stable but was emergently intubated for airway protection.  Family reported concern that her arrest may have been preceded by seizure activity.   She was initially admitted to the ICU and cared for by PCCM.  She was able to be rapidly weaned from the ventilator and extubated 06/05/2023 at 08:28.  To the surprise and delight of the medical team she appeared to be mentally intact with no focal deficits following extubation.  Hospital Course:  Out-of-hospital V-fib cardiac arrest Underwent shock x 5 and epi x 2 - family suggest arrest preceded by possible seizure described as rhoncerous upper airway sounds/grunting, but not tonic/clonic type motion - now successfully extubated and appears  remarkably stable - TTE notes diminished EF at 45-50% with global hypokinesis - Cardiology agrees she is not a candidate for invasive intervention and suggests continuing amiodarone   Postarrest acute lung injury - postarrest acute aspiration pneumonitis Care initially provided by PCCM -respiratory status appears stable with patient now successfully extubated and successfully weaned to room air   Possible seizure Family reported sx they interpreted as a seizure preceding her cardiac arrest - she would certainly be at risk for this with multiple brain mets - discussed w/ Neurology who report Keppra is an appropriate choice for AED in this situation - EEG 9/23 suggestive of severe diffuse encephalopathy with no seizure or epileptiform discharges noted - no active seizure activity appreciated since hospitalization - it is certainly possible that rather than seizure she was experiencing arrhythmia related loss of consciousness leading to her respiratory irregularities though she of course would be at risk for seizure activity as well - plan to continue Keppra indefinitely    Multiple bilateral rib fractures As noted on CT chest 9/23 - due to CPR - pain management continues   Postarrest encephalopathy Mental status appears to have returned to baseline at this time   Age-indeterminate DVT of the left posterior tibial veins Noted on venous duplex 9/25 - has a prior history of brain bleed dating to 2021 (as noted on MRI) while on Eliquis therapy - known history of left lower extremity DVT 11/09/2019 - given the patient has no acute symptoms to suggest that the clot noted on our current study is acute, and given that this is the location of her known prior clot, and with her history of bleeding into the brain I do not feel that  anticoagulation is appropriate   Metastatic lung cancer with mets to brain Receiving ongoing care at [facility] per Dr. [name] and Dr. Barbaraann Cao - I have added them both to her  inpatient care team and made them aware of her admissoin - she was scheduled for tx 9/27 but this has been canceled - she will f/u w/ Dr. [name] to reschedule shortly after she is able to be d/c home    Normocytic anemia Hemoglobin had been on a steady downward trend since admission, but has since stabilized- no obvious source of blood loss   Hypokalemia Supplemented to goal   Mild hypomagnesemia Supplemented to goal   Acute urinary retention After having Foley catheter placed while critically ill/intubated -monitor with repeat In-N-Out catheter as needed -Urecholine added for short course -will need to reattempt Foley discontinuation when patient is on rehab and more mobile    Meds at Time of D/C to CIR:  Current Facility-Administered Medications:    acetaminophen (TYLENOL) tablet 650 mg, 650 mg, Oral, Q6H PRN, Lonia Blood, MD   ALPRAZolam Prudy Feeler) tablet 0.125-0.25 mg, 0.125-0.25 mg, Oral, TID PRN, Lorin Glass, MD   [COMPLETED] amiodarone (PACERONE) tablet 400 mg, 400 mg, Oral, BID, 400 mg at 06/10/23 1037 **FOLLOWED BY** [START ON 06/11/2023] amiodarone (PACERONE) tablet 400 mg, 400 mg, Oral, Daily **FOLLOWED BY** [START ON 06/18/2023] amiodarone (PACERONE) tablet 200 mg, 200 mg, Oral, Daily, Dunn, Dayna N, [name]   bethanechol (URECHOLINE) tablet 10 mg, 10 mg, Oral, TID, Lonia Blood, MD, 10 mg at 06/10/23 1037   bisacodyl (DULCOLAX) suppository 10 mg, 10 mg, Rectal, Once, Lonia Blood, MD   bisacodyl (DULCOLAX) suppository 10 mg, 10 mg, Rectal, Daily PRN, Lonia Blood, MD   Chlorhexidine Gluconate Cloth 2 % PADS 6 each, 6 each, Topical, Daily, Lorin Glass, MD, 6 each at 06/10/23 1039   dexamethasone (DECADRON) tablet 2 mg, 2 mg, Oral, Daily, 2 mg at 06/10/23 1037 **FOLLOWED BY** [START ON 06/14/2023] dexamethasone (DECADRON) tablet 1 mg, 1 mg, Oral, Daily, Lonia Blood, MD   docusate sodium (COLACE) capsule 100 mg, 100 mg, Oral, BID, Jetty Duhamel T,  MD, 100 mg at 06/10/23 1037   famotidine (PEPCID) tablet 20 mg, 20 mg, Oral, Daily, Jetty Duhamel T, MD, 20 mg at 06/10/23 1037   feeding supplement (ENSURE ENLIVE / ENSURE PLUS) liquid 237 mL, 237 mL, Oral, BID BM, Yong Channel C, NP, 237 mL at 06/09/23 1406   HYDROcodone-acetaminophen (NORCO) 7.5-325 MG per tablet 1 tablet, 1 tablet, Oral, Q6H PRN, Lonia Blood, MD   hydrocortisone-pramoxine (PROCTOFOAM-HC) rectal foam 1 applicator, 1 applicator, Rectal, QID PRN, Lonia Blood, MD   levETIRAcetam (KEPPRA) tablet 1,000 mg, 1,000 mg, Oral, BID, Jetty Duhamel T, MD, 1,000 mg at 06/10/23 1037   lidocaine (LIDODERM) 5 % 1 patch, 1 patch, Transdermal, Q24H, John Giovanni, MD   meclizine (ANTIVERT) tablet 25 mg, 25 mg, Oral, TID PRN, Ulice Bold, NP   OLANZapine (ZYPREXA) tablet 2.5 mg, 2.5 mg, Oral, QHS, Yong Channel C, NP, 2.5 mg at 06/09/23 2134   ondansetron (ZOFRAN) injection 4 mg, 4 mg, Intravenous, Q6H PRN, Harris, Whitney D, NP, 4 mg at 06/06/23 1640   Oral care mouth rinse, 15 mL, Mouth Rinse, 4 times per day, Lorin Glass, MD, 15 mL at 06/10/23 1043   Oral care mouth rinse, 15 mL, Mouth Rinse, PRN, Lorin Glass, MD   oxyCODONE (Oxy IR/ROXICODONE) immediate release tablet 5-10 mg, 5-10  mg, Oral, Q4H PRN, Lonia Blood, MD, 5 mg at 06/10/23 1037   oxyCODONE (OXYCONTIN) 12 hr tablet 10 mg, 10 mg, Oral, Q12H, Ulice Bold, NP, 10 mg at 06/10/23 1038   pantoprazole (PROTONIX) EC tablet 40 mg, 40 mg, Oral, Daily, Lonia Blood, MD, 40 mg at 06/10/23 1037   polyethylene glycol (MIRALAX / GLYCOLAX) packet 17 g, 17 g, Oral, Daily, Lonia Blood, MD, 17 g at 06/09/23 4098   senna (SENOKOT) tablet 8.6 mg, 1 tablet, Oral, Q12H PRN, Ulice Bold, NP, 8.6 mg at 06/08/23 2144   Day of Discharge BP 110/76 (BP Location: Left Arm)   Pulse (!) 102   Temp 98.7 F (37.1 C) (Oral)   Resp 16   Ht 5\' 2"  (1.575 m)   Wt 68 kg   SpO2 96%   BMI 27.44  kg/m   Physical Exam: General: No acute respiratory distress Lungs: Clear to auscultation bilaterally without wheezes or crackles Cardiovascular: Regular rate and rhythm without murmur gallop or rub normal S1 and S2 Abdomen: Nontender, nondistended, soft, bowel sounds positive, no rebound, no ascites, no appreciable mass Extremities: Trace bilateral lower extremity edema  Basic Metabolic Panel: Recent Labs  Lab 06/04/23 0421 06/04/23 0706 06/05/23 0311 06/06/23 0436 06/08/23 0422 06/09/23 0356 06/10/23 1200  NA  --    < > 141 140 142 140 139  K  --    < > 3.1* 3.3* 3.6 4.1 3.9  CL  --    < > 105 107 101 104 105  CO2  --    < > 26 26 29 27 26   GLUCOSE  --    < > 201* 111* 126* 117* 173*  BUN  --    < > 41* 31* 28* 22 23  CREATININE  --    < > 1.20* 1.25* 1.15* 1.02* 1.16*  CALCIUM  --    < > 7.4* 7.4* 8.6* 8.4* 8.7*  MG 1.4*  --  2.7* 2.3 1.7 2.2  --   PHOS 5.0*  --   --   --   --   --   --    < > = values in this interval not displayed.    CBC: Recent Labs  Lab 06/04/23 0706 06/05/23 0311 06/06/23 0436 06/08/23 0422 06/09/23 0356  WBC 12.4* 9.3 10.0 10.2 10.6*  HGB 11.6* 9.0* 7.6* 7.6* 7.7*  HCT 32.8* 25.3* 21.9* 22.8* 22.3*  MCV 93.2 95.1 98.2 97.4 96.1  PLT 208 193 198 240 258    Time spent in discharge (includes decision making & examination of pt): 35 minutes  06/10/2023, 4:40 PM   Lonia Blood, MD [facility] Office  [phone]

## 2023-06-10 NOTE — Progress Notes (Signed)
PMR Admission Coordinator Pre-Admission Assessment   Patient: Virginia [name] is an 68 y.o., female MRN: [id number] DOB: [birth year] Height: 5\' 2"  (157.5 cm) Weight: 68 kg   Insurance Information HMO:     PPO:      PCP:      IPA:      80/20:   yes   OTHER:  PRIMARY: medicare AB      Policy#: [id number]   Subscriber:  Phone#: Verified online    Fax#:  Pre-Cert#:       Employer:  Benefits:  Phone #:      Name:  Eff. Date: Parts A  and B effective 07/11/22 Deduct: $1632      Out of Pocket Max:  None      Life Max: N/A  CIR: 100%      SNF: 100 days Outpatient: 80%     Co-Pay: 20% Home Health: 100%      Co-Pay: none DME: 80%     Co-Pay: 20% Providers: patient's choice Providers: in network  SECONDARY: [employer]       Policy#: [id number]      Phone#:    Financial Counselor:       Phone#:    The "Data Collection Information Summary" for patients in Inpatient Rehabilitation Facilities with attached "Privacy Act Statement-Health Care Records" was provided and verbally reviewed with: Patient and Family   Emergency Contact Information Contact Information       Name Relation Home Work Mobile    Hollis Spouse [phone]    Tama Headings Daughter [phone]   [phone]         Other Contacts   None on File        Current Medical History  Patient Admitting Diagnosis: Cardiac Arrest  History of Present Illness: Pt. 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  She received 5 shocks via EMS for reported VF prior to her arrival and 2 rounds of epinephrine.  On ED arrival she was actually hemodynamically stable but was emergently intubated for airway protection.  Family reported concern that her arrest may have been preceded by seizure  activity.She was initially admitted to the ICU and cared for by PCCM.  She was able to be rapidly weaned from the ventilator and extubated 06/05/2023 at 08:28. She she appeared to be mentally intact with no focal deficits following extubation. 2D echo demonstrated mild LV dysfunction EF 45 to 50% with no valvular abnormalities. Plan was for medical management given her end-stage lung cancer felt not a candidate for coronary angiography with PCI. Started on amiodarone. To admission she had been on Lasix 40 mg twice daily and K-Dur 40 mEq twice daily but no mag supplement. Initial EKG showed narrow complex rhythm 87bpm, RVH pattern with new S1, Q3, T3 pattern, ST depression noted II, III, V6. Q wave in III is known but S1, T3 were new from last tracing (logged 05/14/23 with scan dated 05/03/23). QTc , one PVC. P waves extremely difficult to discern - either not present or very low amplitude in V3 with long first degree AVB. She also had this for a period of time in the ED before more clear P waves emerged, with NSR and occasional PVCs, couplets, triplets, and short runs NSVT up to 4-5 beats. Suspect arrhythmias were  related to metabolic derangements. VF arrest felt to be related to metabolic derangements with cesium 1.4, potassium 3.4 and calcium 6.76 ionized calcium 1.07. Deemed not to be a candidate for cath/PCI due to history of brain mets with head bleed in the past. Conservative management planned. Per Cardiology, amiodarone 400 mg twice daily until 10/8 and then transition to 200 mg daily. Will keep her on this for few weeks and then consider insisting her over to metoprolol. Pt. Will need to follow up with her oncologist once discharged from CIR. PT. Seen by PT/OT/SLP and they recommend CIR to assist return to PLOF.    Patient's medical record from [facility]  has been reviewed by the rehabilitation admission coordinator and physician.   Past Medical History      Past Medical History:   Diagnosis Date   Anemia     Anxiety     Concussion 09/28/2019   DVT (deep venous thrombosis) (HCC) 2021    L leg   Dyspnea     GERD (gastroesophageal reflux disease)     Hypercholesterolemia      per pt, she does not have elevated lipids   Hypertension     met lung ca dx'd 09/2019    mets to spine, hip and brain   PONV (postoperative nausea and vomiting)     Tobacco abuse            Has the patient had major surgery during 100 days prior to admission? No   Family History   family history includes Cancer in her sister; Drug abuse in her daughter and son; Heart attack in her brother; Heart disease in her brother and father.   Current Medications  Current Medications    Current Facility-Administered Medications:    acetaminophen (TYLENOL) tablet 650 mg, 650 mg, Oral, Q6H PRN, Lonia Blood, MD   ALPRAZolam Prudy Feeler) tablet 0.125-0.25 mg, 0.125-0.25 mg, Oral, TID PRN, Lorin Glass, MD   amiodarone (PACERONE) tablet 400 mg, 400 mg, Oral, BID, 400 mg at 06/09/23 2133 **FOLLOWED BY** [START ON 06/11/2023] amiodarone (PACERONE) tablet 400 mg, 400 mg, Oral, Daily **FOLLOWED BY** [START ON 06/18/2023] amiodarone (PACERONE) tablet 200 mg, 200 mg, Oral, Daily, Dunn, Dayna N, [name]   bethanechol (URECHOLINE) tablet 10 mg, 10 mg, Oral, TID, Lonia Blood, MD, 10 mg at 06/09/23 2135   bisacodyl (DULCOLAX) suppository 10 mg, 10 mg, Rectal, Once, Lonia Blood, MD   bisacodyl (DULCOLAX) suppository 10 mg, 10 mg, Rectal, Daily PRN, Lonia Blood, MD   Chlorhexidine Gluconate Cloth 2 % PADS 6 each, 6 each, Topical, Daily, Lorin Glass, MD, 6 each at 06/09/23 1914   dexamethasone (DECADRON) tablet 2 mg, 2 mg, Oral, Daily, 2 mg at 06/09/23 0928 **FOLLOWED BY** [START ON 06/14/2023] dexamethasone (DECADRON) tablet 1 mg, 1 mg, Oral, Daily, Jetty Duhamel T, MD   docusate sodium (COLACE) capsule 100 mg, 100 mg, Oral, BID, Jetty Duhamel T, MD, 100 mg at 06/09/23 7829    famotidine (PEPCID) tablet 20 mg, 20 mg, Oral, Daily, Jetty Duhamel T, MD, 20 mg at 06/09/23 5621   feeding supplement (ENSURE ENLIVE / ENSURE PLUS) liquid 237 mL, 237 mL, Oral, BID BM, Yong Channel C, NP, 237 mL at 06/09/23 1406   HYDROcodone-acetaminophen (NORCO) 7.5-325 MG per tablet 1 tablet, 1 tablet, Oral, Q6H PRN, Lonia Blood, MD   hydrocortisone-pramoxine (PROCTOFOAM-HC) rectal foam 1 applicator, 1 applicator, Rectal, QID PRN, Lonia Blood, MD   levETIRAcetam (KEPPRA)  tablet 1,000 mg, 1,000 mg, Oral, BID, Jetty Duhamel T, MD, 1,000 mg at 06/09/23 2133   lidocaine (LIDODERM) 5 % 1 patch, 1 patch, Transdermal, Q24H, John Giovanni, MD   meclizine (ANTIVERT) tablet 25 mg, 25 mg, Oral, TID PRN, Ulice Bold, NP   OLANZapine (ZYPREXA) tablet 2.5 mg, 2.5 mg, Oral, QHS, Yong Channel C, NP, 2.5 mg at 06/09/23 2134   ondansetron (ZOFRAN) injection 4 mg, 4 mg, Intravenous, Q6H PRN, Janeann Forehand D, NP, 4 mg at 06/06/23 1640   Oral care mouth rinse, 15 mL, Mouth Rinse, 4 times per day, Lorin Glass, MD, 15 mL at 06/09/23 2136   Oral care mouth rinse, 15 mL, Mouth Rinse, PRN, Lorin Glass, MD   oxyCODONE (Oxy IR/ROXICODONE) immediate release tablet 5-10 mg, 5-10 mg, Oral, Q4H PRN, Lonia Blood, MD, 10 mg at 06/10/23 5409   oxyCODONE (OXYCONTIN) 12 hr tablet 10 mg, 10 mg, Oral, Q12H, Yong Channel C, NP, 10 mg at 06/09/23 2134   pantoprazole (PROTONIX) EC tablet 40 mg, 40 mg, Oral, Daily, Jetty Duhamel T, MD, 40 mg at 06/09/23 0928   polyethylene glycol (MIRALAX / GLYCOLAX) packet 17 g, 17 g, Oral, Daily, Lonia Blood, MD, 17 g at 06/09/23 8119   senna (SENOKOT) tablet 8.6 mg, 1 tablet, Oral, Q12H PRN, Ulice Bold, NP, 8.6 mg at 06/08/23 2144     Patients Current Diet:  Diet Order                  Diet regular Room service appropriate? Yes with Assist; Fluid consistency: Thin  Diet effective now                         Precautions  / Restrictions Precautions Precautions: Fall Restrictions Weight Bearing Restrictions: No    Has the patient had 2 or more falls or a fall with injury in the past year? Yes   Prior Activity Level Limited Community (1-2x/wk): Pt. went out for appts   Prior Functional Level Self Care: Did the patient need help bathing, dressing, using the toilet or eating? Needed some help   Indoor Mobility: Did the patient need assistance with walking from room to room (with or without device)? Needed some help   Stairs: Did the patient need assistance with internal or external stairs (with or without device)? Needed some help   Functional Cognition: Did the patient need help planning regular tasks such as shopping or remembering to take medications? Needed some help   Patient Information Are you of Hispanic, Latino/a,or Spanish origin?: A. No, not of Hispanic, Latino/a, or Spanish origin What is your race?: A. White Do you need or want an interpreter to communicate with a doctor or health care staff?: 0. No   Patient's Response To:  Health Literacy and Transportation Is the patient able to respond to health literacy and transportation needs?: Yes Health Literacy - How often do you need to have someone help you when you read instructions, pamphlets, or other written material from your doctor or pharmacy?: Never In the past 12 months, has lack of transportation kept you from medical appointments or from getting medications?: No In the past 12 months, has lack of transportation kept you from meetings, work, or from getting things needed for daily living?: No   Journalist, newspaper / Equipment Home Equipment: Occupational hygienist (4 wheels), Wheelchair - manual, Shower seat, Agricultural consultant (2 wheels), BSC/3in1   Prior Device Use:  Indicate devices/aids used by the patient prior to current illness, exacerbation or injury? Manual wheelchair and Walker   Current Functional Level Cognition   Overall Cognitive  Status: Within Functional Limits for tasks assessed Orientation Level: Oriented X4    Extremity Assessment (includes Sensation/Coordination)   Upper Extremity Assessment: Defer to OT evaluation LUE Deficits / Details: old deficits since brain surgery--has movement in all joints just not normal and mostly 2/5 strength LUE Coordination: decreased gross motor, decreased fine motor  Lower Extremity Assessment: LLE deficits/detail LLE Deficits / Details: Deficits since brain surgery, grossly 2-/5     ADLs   Overall ADL's : Needs assistance/impaired Eating/Feeding: Minimal assistance Eating/Feeding Details (indicate cue type and reason): supported sitting Grooming: Moderate assistance Grooming Details (indicate cue type and reason): supported sitting Upper Body Bathing: Moderate assistance Upper Body Bathing Details (indicate cue type and reason): supported sitting Lower Body Bathing: Maximal assistance Lower Body Bathing Details (indicate cue type and reason): Mod A +2 sit<>stand Upper Body Dressing : Moderate assistance Upper Body Dressing Details (indicate cue type and reason): supported sitting Lower Body Dressing: Total assistance Lower Body Dressing Details (indicate cue type and reason): Mod A +2 sit<>stand Toilet Transfer: Moderate assistance, +2 for physical assistance, Stand-pivot Toilet Transfer Details (indicate cue type and reason): simulated bed>recliner on her right Toileting- Clothing Manipulation and Hygiene: Total assistance Toileting - Clothing Manipulation Details (indicate cue type and reason): Mod A +2 sit<>stand     Mobility   Overal bed mobility: Needs Assistance Bed Mobility: Supine to Sit, Rolling, Sidelying to Sit Rolling: Max assist, +2 for physical assistance Sidelying to sit: Max assist, +2 for physical assistance General bed mobility comments: A with legs, to reach with RUE for left rail and to push up to sit     Transfers   Overall transfer level: Needs  assistance Equipment used: 2 person hand held assist Transfers: Sit to/from Stand, Bed to chair/wheelchair/BSC Sit to Stand: Mod assist, +2 physical assistance Bed to/from chair/wheelchair/BSC transfer type:: Stand pivot Stand pivot transfers: Mod assist, +2 physical assistance General transfer comment: Pt Needed mod assist of 2 to power up from bed with assist using pad to rise. Pt needed assist to move left LE for transfer and mod verbal, tactile cues to pivot to recliner.     Ambulation / Gait / Stairs / Clinical biochemist / Balance Dynamic Sitting Balance Sitting balance - Comments: Losing balance all directions needing constant support mod to total assist Balance Overall balance assessment: Needs assistance Sitting-balance support: Bilateral upper extremity supported, Feet supported Sitting balance-Leahy Scale: Poor Sitting balance - Comments: Losing balance all directions needing constant support mod to total assist Postural control: Left lateral lean Standing balance support: Bilateral upper extremity supported Standing balance-Leahy Scale: Poor Standing balance comment: Bil HHA with left lean +2 mod assist     Special needs/care consideration Special service needs Pt will need to follow up with oncologist as outpatient    Previous Home Environment (from acute therapy documentation) Living Arrangements: Spouse/significant other  Lives With: Spouse Available Help at Discharge: Family, Available 24 hours/day Type of Home: House Home Layout: Two level, Bed/bath upstairs, 1/2 bath on main level Alternate Level Stairs-Rails: Right Alternate Level Stairs-Number of Steps: 18 Home Access: Stairs to enter Entrance Stairs-Rails: Left Entrance Stairs-Number of Steps: 5 Bathroom Shower/Tub: Health visitor: Handicapped height Home Care Services: No Additional Comments: considering getting a stair lift or moving to a  more accessible home   Discharge  Living Setting Plans for Discharge Living Setting: Patient's home Type of Home at Discharge: House Discharge Home Layout: Two level, 1/2 bath on main level Alternate Level Stairs-Rails: Right Alternate Level Stairs-Number of Steps: 18 Discharge Home Access: Stairs to enter Entrance Stairs-Rails: Left Entrance Stairs-Number of Steps: 5 Discharge Bathroom Shower/Tub: Walk-in shower Discharge Bathroom Toilet: Handicapped height Discharge Bathroom Accessibility: Yes How Accessible: Accessible via wheelchair, Accessible via walker Does the patient have any problems obtaining your medications?: No   Social/Family/Support Systems Patient Roles: Spouse Contact Information: Marlean Snowberger Anticipated Caregiver: [phone] Ability/Limitations of Caregiver: 24/7 min A Caregiver Availability: 24/7 Discharge Plan Discussed with Primary Caregiver: Yes Is Caregiver In Agreement with Plan?: Yes Does Caregiver/Family have Issues with Lodging/Transportation while Pt is in Rehab?: No   Goals Patient/Family Goal for Rehab: PT/OT/SLP min A Expected length of stay: 12-14 days Pt/Family Agrees to Admission and willing to participate: Yes Program Orientation Provided & Reviewed with Pt/Caregiver Including Roles  & Responsibilities: Yes   Decrease burden of Care through IP rehab admission: not anticipated    Possible need for SNF placement upon discharge: not anticipated    Patient Condition: I have reviewed medical records from [facility], spoken with CM, and patient. I met with patient at the bedside for inpatient rehabilitation assessment.  Patient will benefit from ongoing PT, OT, and SLP, can actively participate in 3 hours of therapy a day 5 days of the week, and can make measurable gains during the admission.  Patient will also benefit from the coordinated team approach during an Inpatient Acute Rehabilitation admission.  The patient will receive intensive therapy as well as  Rehabilitation physician, nursing, social worker, and care management interventions.  Due to bladder management, bowel management, safety, skin/wound care, disease management, medication administration, pain management, and patient education the patient requires 24 hour a day rehabilitation nursing.  The patient is currently mod+2-max+2             with mobility and basic ADLs.  Discharge setting and therapy post discharge at home with home health is anticipated.  Patient has agreed to participate in the Acute Inpatient Rehabilitation Program and will admit today.   Preadmission Screen Completed By:  Jeronimo Greaves, 06/10/2023 10:34 AM ______________________________________________________________________   Discussed status with Dr. Riley Kill on 06/10/23 at 930 and received approval for admission today.   Admission Coordinator:  Jeronimo Greaves, CCC-SLP, time 1045/Date 06/10/23    Assessment/Plan: Diagnosis: debility after cardiac arrest Does the need for close, 24 hr/day Medical supervision in concert with the patient's rehab needs make it unreasonable for this patient to be served in a less intensive setting? Yes Co-Morbidities requiring supervision/potential complications: lung cancer with mets to brain, dvt, anxiety disorder, htn Due to bladder management, bowel management, safety, skin/wound care, disease management, medication administration, pain management, and patient education, does the patient require 24 hr/day rehab nursing? Yes Does the patient require coordinated care of a physician, rehab nurse, PT, OT, and SLP to address physical and functional deficits in the context of the above medical diagnosis(es)? Yes Addressing deficits in the following areas: balance, endurance, locomotion, strength, transferring, bowel/bladder control, bathing, dressing, feeding, grooming, toileting, cognition, and psychosocial support Can the patient actively participate in an intensive therapy program of at least 3  hrs of therapy 5 days a week? Yes The potential for patient to make measurable gains while on inpatient rehab is excellent Anticipated functional outcomes upon discharge from inpatient  rehab: min assist PT, min assist OT, min assist SLP Estimated rehab length of stay to reach the above functional goals is: 12-14 days Anticipated discharge destination: Home 10. Overall Rehab/Functional Prognosis: excellent     MD Signature: Ranelle Oyster, MD, [facility] Physical Medicine & Rehabilitation Medical Director Rehabilitation Services 06/10/2023

## 2023-06-10 NOTE — Progress Notes (Signed)
Inpatient Rehab Admissions Coordinator:    Pt. To admit to CIR today. RN may call report to [phone]  Pt. In agreement to participate in intensive rehab program on CIR for 12-14 days with the goal of discharging home at min A level with support from her husband.   Megan Salon, MS, CCC-SLP Rehab Admissions Coordinator  [phone] (celll) [phone] (office)

## 2023-06-11 DIAGNOSIS — I469 Cardiac arrest, cause unspecified: Secondary | ICD-10-CM | POA: Diagnosis not present

## 2023-06-11 LAB — COMPREHENSIVE METABOLIC PANEL
ALT: 35 U/L (ref 0–44)
AST: 20 U/L (ref 15–41)
Albumin: 2.1 g/dL — ABNORMAL LOW (ref 3.5–5.0)
Alkaline Phosphatase: 51 U/L (ref 38–126)
Anion gap: 9 (ref 5–15)
BUN: 20 mg/dL (ref 8–23)
CO2: 30 mmol/L (ref 22–32)
Calcium: 8.4 mg/dL — ABNORMAL LOW (ref 8.9–10.3)
Chloride: 100 mmol/L (ref 98–111)
Creatinine, Ser: 1.18 mg/dL — ABNORMAL HIGH (ref 0.44–1.00)
GFR, Estimated: 51 mL/min — ABNORMAL LOW (ref >60)
Glucose, Bld: 121 mg/dL — ABNORMAL HIGH (ref 70–99)
Potassium: 3.8 mmol/L (ref 3.5–5.1)
Sodium: 139 mmol/L (ref 135–145)
Total Bilirubin: 0.6 mg/dL (ref 0.3–1.2)
Total Protein: 4.9 g/dL — ABNORMAL LOW (ref 6.5–8.1)

## 2023-06-11 LAB — CBC WITH DIFFERENTIAL/PLATELET
Abs Immature Granulocytes: 0.75 10*3/uL — ABNORMAL HIGH (ref 0.00–0.07)
Basophils Absolute: 0 10*3/uL (ref 0.0–0.1)
Basophils Relative: 0 %
Eosinophils Absolute: 0 10*3/uL (ref 0.0–0.5)
Eosinophils Relative: 0 %
HCT: 22.6 % — ABNORMAL LOW (ref 36.0–46.0)
Hemoglobin: 7.6 g/dL — ABNORMAL LOW (ref 12.0–15.0)
Immature Granulocytes: 7 %
Lymphocytes Relative: 29 %
Lymphs Abs: 3 10*3/uL (ref 0.7–4.0)
MCH: 32.8 pg (ref 26.0–34.0)
MCHC: 33.6 g/dL (ref 30.0–36.0)
MCV: 97.4 fL (ref 80.0–100.0)
Monocytes Absolute: 1.2 10*3/uL — ABNORMAL HIGH (ref 0.1–1.0)
Monocytes Relative: 12 %
Neutro Abs: 5.2 10*3/uL (ref 1.7–7.7)
Neutrophils Relative %: 52 %
Platelets: 312 10*3/uL (ref 150–400)
RBC: 2.32 MIL/uL — ABNORMAL LOW (ref 3.87–5.11)
RDW: 17.2 % — ABNORMAL HIGH (ref 11.5–15.5)
Smear Review: ADEQUATE
WBC: 10.2 10*3/uL (ref 4.0–10.5)
nRBC: 0 % (ref 0.0–0.2)

## 2023-06-11 LAB — GLUCOSE, CAPILLARY
Glucose-Capillary: 110 mg/dL — ABNORMAL HIGH (ref 70–99)
Glucose-Capillary: 98 mg/dL (ref 70–99)
Glucose-Capillary: 159 mg/dL — ABNORMAL HIGH (ref 70–99)
Glucose-Capillary: 150 mg/dL — ABNORMAL HIGH (ref 70–99)
Glucose-Capillary: 151 mg/dL — ABNORMAL HIGH (ref 70–99)

## 2023-06-11 LAB — MAGNESIUM: Magnesium: 1.6 mg/dL — ABNORMAL LOW (ref 1.7–2.4)

## 2023-06-11 MED ORDER — HYDROCODONE-ACETAMINOPHEN 7.5-325 MG PO TABS
1.0000 | ORAL_TABLET | Freq: Four times a day (QID) | ORAL | Status: DC | PRN
  Administered 2023-06-12 – 2023-06-13 (×3): 2 via ORAL
  Filled 2023-06-11 (×3): qty 2

## 2023-06-11 MED ORDER — MAGNESIUM OXIDE -MG SUPPLEMENT 400 (240 MG) MG PO TABS
400.0000 mg | ORAL_TABLET | Freq: Two times a day (BID) | ORAL | Status: DC
  Administered 2023-06-12 – 2023-07-02 (×41): 400 mg via ORAL
  Filled 2023-06-11 (×41): qty 1

## 2023-06-11 MED ORDER — MAGNESIUM SULFATE 2 GM/50ML IV SOLN
2.0000 g | Freq: Once | INTRAVENOUS | Status: AC
  Administered 2023-06-11: 2 g via INTRAVENOUS
  Filled 2023-06-11: qty 50

## 2023-06-11 MED ORDER — PROSOURCE PLUS PO LIQD
30.0000 mL | Freq: Two times a day (BID) | ORAL | Status: DC
  Administered 2023-06-12 – 2023-07-02 (×39): 30 mL via ORAL
  Filled 2023-06-11 (×33): qty 30

## 2023-06-11 MED ORDER — POLYETHYLENE GLYCOL 3350 17 G PO PACK
17.0000 g | PACK | Freq: Every day | ORAL | Status: DC
  Administered 2023-06-14 – 2023-06-17 (×4): 17 g via ORAL
  Filled 2023-06-11 (×7): qty 1

## 2023-06-11 MED ORDER — ENSURE ENLIVE PO LIQD
237.0000 mL | Freq: Two times a day (BID) | ORAL | Status: DC
  Administered 2023-06-12 – 2023-07-02 (×32): 237 mL via ORAL

## 2023-06-11 NOTE — Plan of Care (Signed)
  Problem: RH Balance Goal: LTG: Patient will maintain dynamic sitting balance (OT) Description: LTG:  Patient will maintain dynamic sitting balance with assistance during activities of daily living (OT) Flowsheets (Taken 06/11/2023 0932) LTG: Pt will maintain dynamic sitting balance during ADLs with: Supervision/Verbal cueing Goal: LTG Patient will maintain dynamic standing with ADLs (OT) Description: LTG:  Patient will maintain dynamic standing balance with assist during activities of daily living (OT)  Flowsheets (Taken 06/11/2023 0932) LTG: Pt will maintain dynamic standing balance during ADLs with: Contact Guard/Touching assist   Problem: Sit to Stand Goal: LTG:  Patient will perform sit to stand in prep for activites of daily living with assistance level (OT) Description: LTG:  Patient will perform sit to stand in prep for activites of daily living with assistance level (OT) Flowsheets (Taken 06/11/2023 0932) LTG: PT will perform sit to stand in prep for activites of daily living with assistance level: Contact Guard/Touching assist   Problem: RH Grooming Goal: LTG Patient will perform grooming w/assist,cues/equip (OT) Description: LTG: Patient will perform grooming with assist, with/without cues using equipment (OT) Flowsheets (Taken 06/11/2023 0932) LTG: Pt will perform grooming with assistance level of: Supervision/Verbal cueing   Problem: RH Dressing Goal: LTG Patient will perform upper body dressing (OT) Description: LTG Patient will perform upper body dressing with assist, with/without cues (OT). Flowsheets (Taken 06/11/2023 0932) LTG: Pt will perform upper body dressing with assistance level of: Set up assist Goal: LTG Patient will perform lower body dressing w/assist (OT) Description: LTG: Patient will perform lower body dressing with assist, with/without cues in positioning using equipment (OT) Flowsheets (Taken 06/11/2023 0932) LTG: Pt will perform lower body dressing with  assistance level of: Contact Guard/Touching assist   Problem: RH Toileting Goal: LTG Patient will perform toileting task (3/3 steps) with assistance level (OT) Description: LTG: Patient will perform toileting task (3/3 steps) with assistance level (OT)  Flowsheets (Taken 06/11/2023 0932) LTG: Pt will perform toileting task (3/3 steps) with assistance level: Contact Guard/Touching assist   Problem: RH Toilet Transfers Goal: LTG Patient will perform toilet transfers w/assist (OT) Description: LTG: Patient will perform toilet transfers with assist, with/without cues using equipment (OT) Flowsheets (Taken 06/11/2023 0932) LTG: Pt will perform toilet transfers with assistance level of: Contact Guard/Touching assist   Problem: RH Tub/Shower Transfers Goal: LTG Patient will perform tub/shower transfers w/assist (OT) Description: LTG: Patient will perform tub/shower transfers with assist, with/without cues using equipment (OT) Flowsheets (Taken 06/11/2023 0932) LTG: Pt will perform tub/shower stall transfers with assistance level of: Contact Guard/Touching assist

## 2023-06-11 NOTE — Plan of Care (Signed)
Wound Plan      Wounds present: 06/03/23 Buttocks Bilateral Deep Tissue Pressure Injury - Purple or maroon localized area of discolored intact skin or blood-filled blister due to damage of underlying soft tissue from pressure and/or shear. purple   Interventions:  Foam to buttocks Off load/side lying positioning when in bed Air Mattress bed (none available 06/10/23, on waiting list) Air redistribution cushion for wheelchair Dietician consult WOC consult  Prosource twice a day tween meals Ensure Enlive twice a day tween meals CMM diet; eating ~50% of meals   Braden Score: 17 Sensory: 4 Moisture: 4 Activity: 2 Mobility: 2 Nutrition: 3 Friction: 2  Contributors: Wendi Maya, [name] Chana Bode, RN-BC, CRRN

## 2023-06-11 NOTE — Evaluation (Signed)
Physical Therapy Assessment and Plan  Patient Details  Name: Virginia [name] MRN: [id number] Date of Birth: [birth year]  PT Diagnosis: Abnormal posture, Abnormality of gait, Coordination disorder, Difficulty walking, Hemiparesis non-dominant, Impaired cognition, Muscle weakness, and Pain in back Rehab Potential: Good ELOS: 3 weeks   Today's Date: 06/11/2023 PT Individual Time: 1302-1420 PT Individual Time Calculation (min): 78 min    Hospital Problem: Principal Problem:   Cardiac arrest [facility]) Active Problems:   Lung cancer metastatic to brain (HCC)   Debility   History of DVT (deep vein thrombosis)   Urine retention   Acute blood loss anemia   History of ventricular fibrillation   Past Medical History:  Past Medical History:  Diagnosis Date   Anemia    Anxiety    Concussion 09/28/2019   DVT (deep venous thrombosis) (HCC) 2021   L leg   Dyspnea    GERD (gastroesophageal reflux disease)    Hypercholesterolemia    per pt, she does not have elevated lipids   Hypertension    met lung ca dx'd 09/2019   mets to spine, hip and brain   PONV (postoperative nausea and vomiting)    Tobacco abuse    Past Surgical History:  Past Surgical History:  Procedure Laterality Date   ABDOMINAL HYSTERECTOMY     partial/ left ovaries   APPLICATION OF CRANIAL NAVIGATION N/A 12/02/2020   Procedure: APPLICATION OF CRANIAL NAVIGATION;  Surgeon: Maeola Harman, MD;  Location: [facility] OR;  Service: Neurosurgery;  Laterality: N/A;   CHOLECYSTECTOMY     CRANIOTOMY N/A 12/02/2020   Procedure: Posterior fossa craniotomy for tumor resection with brainlab;  Surgeon: Maeola Harman, MD;  Location: [facility] OR;  Service: Neurosurgery;  Laterality: N/A;   DILATION AND CURETTAGE OF UTERUS     IR IMAGING GUIDED PORT INSERTION  10/23/2019   IR PATIENT EVAL TECH 0-60 MINS  11/21/2021   IR PATIENT EVAL TECH 0-60 MINS  11/24/2021   IR PATIENT EVAL TECH 0-60 MINS  11/29/2021   IR PATIENT EVAL TECH 0-60 MINS  12/04/2021   IR  PATIENT EVAL TECH 0-60 MINS  12/12/2021   IR PATIENT EVAL TECH 0-60 MINS  12/19/2021   IR PATIENT EVAL TECH 0-60 MINS  12/27/2021   IR PATIENT EVAL TECH 0-60 MINS  01/03/2022   IR PATIENT EVAL TECH 0-60 MINS  01/10/2022   IR PATIENT EVAL TECH 0-60 MINS  01/18/2022   IR PATIENT EVAL TECH 0-60 MINS  02/13/2022   IR PATIENT EVAL TECH 0-60 MINS  02/20/2022   IR RADIOLOGIST EVAL & MGMT  01/23/2022   IR REMOVAL TUN ACCESS W/ PORT W/O FL MOD SED  11/17/2021   KYPHOPLASTY N/A 03/15/2020   Procedure: Thoracic Eight KYPHOPLASTY;  Surgeon: Maeola Harman, MD;  Location: [facility] OR;  Service: Neurosurgery;  Laterality: N/A;  prone    LIPOMA EXCISION  2018   removed under left breast and right thigh.   TUBAL LIGATION      Assessment & Plan Clinical Impression: Patient is a 67 year old female who suffered a + cardiac arrest. Presenting rhythm was apparently VF, but family felt that the arrest may have been brought on by a seizure. Estimated downtime greater than 20 minutes. On ED arrival patient was seen hemodynamically stable but emergently intubated for airway protection.   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  She had nevertheless responded well to recent treatment. She also has a history of prior DVT,  hypertension, hyperlipidemia, tobacco abuse and anxiety. S/p craniotomy, resection of progressive cerebellar mass by Dr. Venetia Maxon on 12/02/2020.  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    Extubated on 9/25. Transitioned to oral amiodarone. Not a candidate for [facility] due to ? prior brain bleed while on Eliquis in setting of brain mets. Unclear reason for no pharmacologic DVT prophylaxis. TTE notes diminished EF at 45-50% with global hypokinesis - Cardiology agree she is not a candidate for invasive intervention and suggests continuing amiodarone. EEG 9/23 suggestive of severe diffuse encephalopathy with no seizure or epileptiform discharges noted - no active seizure activity appreciated since  hospitalization - it is certainly possible that rather than seizure she was experiencing arrhythmia related syncope leading to her respiratory irregularities though she of course would be at risk for seizure activity as well - plan to continue Keppra indefinitely. Decadron taper for acute lung injury.  Patient currently requires mod with mobility secondary to muscle weakness, decreased cardiorespiratoy endurance, impaired timing and sequencing and decreased coordination, decreased visual perceptual skills, decreased midline orientation and decreased attention to left, decreased initiation, decreased problem solving, and decreased memory, central origin, and decreased sitting balance, decreased standing balance, decreased postural control, hemiplegia, and decreased balance strategies.  Prior to hospitalization, patient was min with mobility and lived with Spouse in a House home.  Home access is 4Stairs to enter.  Patient will benefit from skilled PT intervention to maximize safe functional mobility, minimize fall risk, and decrease caregiver burden for planned discharge home with 24 hour assist.  Anticipate patient will benefit from follow up OP at discharge.  PT - End of Session Activity Tolerance: Tolerates 30+ min activity without fatigue Endurance Deficit: Yes Endurance Deficit Description: rest breaks with all mobility PT Assessment Rehab Potential (ACUTE/IP ONLY): Good PT Barriers to Discharge: Inaccessible home environment;Home environment access/layout PT Barriers to Discharge Comments: 4 STE no handrail, full flight of stairs to bedroom PT Patient demonstrates impairments in the following area(s): Balance;Behavior;Endurance;Motor;Pain;Perception;Safety;Skin Integrity PT Transfers Functional Problem(s): Bed Mobility;Bed to Chair;Car;Furniture PT Locomotion Functional Problem(s): Ambulation;Stairs PT Plan PT Intensity: Minimum of 1-2 x/day ,45 to 90 minutes PT Frequency: 5 out of 7 days PT  Duration Estimated Length of Stay: 3 weeks PT Treatment/Interventions: Ambulation/gait training;Discharge planning;Functional mobility training;Psychosocial support;Therapeutic Activities;Visual/perceptual remediation/compensation;Balance/vestibular training;Disease management/prevention;Neuromuscular re-education;Therapeutic Exercise;Skin care/wound management;Wheelchair propulsion/positioning;Cognitive remediation/compensation;DME/adaptive equipment instruction;Pain management;Splinting/orthotics;UE/LE Strength taining/ROM;Community reintegration;Patient/family education;Stair training;UE/LE Coordination activities PT Transfers Anticipated Outcome(s): CGA PT Locomotion Anticipated Outcome(s): CGA with LRAD PT Recommendation Recommendations for Other Services: None Follow Up Recommendations: Outpatient PT Patient destination: Home Equipment Recommended: To be determined   PT Evaluation Precautions/Restrictions Precautions Precautions: Fall Restrictions Weight Bearing Restrictions: No General   Vital SignsTherapy Vitals Temp: 98.4 F (36.9 C) Pulse Rate: 79 Resp: 17 BP: 130/82 Patient Position (if appropriate): Lying Oxygen Therapy SpO2: 97 % O2 Device: Room Air Pain Pain Assessment Pain Scale: 0-10 Pain Score: 0-No pain Pain Type: Chronic pain Pain Location: Back Pain Orientation: Mid Pain Intervention(s): Medication (See eMAR) Pain Interference Pain Interference Pain Effect on Sleep: 3. Frequently Pain Interference with Therapy Activities: 2. Occasionally Pain Interference with Day-to-Day Activities: 4. Almost constantly Home Living/Prior Functioning Home Living Available Help at Discharge: Family;Available 24 hours/day Type of Home: House Home Access: Stairs to enter [employer] of Steps: 4 Entrance Stairs-Rails: None Home Layout: Two level;Bed/bath upstairs;1/2 bath on main level Alternate Level Stairs-Number of Steps: 15 Alternate Level Stairs-Rails:  Right Bathroom Shower/Tub: Walk-in shower;Tub/shower unit Bathroom Toilet: Handicapped height Additional Comments: considering getting a stair lift or  moving to a more accessible home  Lives With: Spouse Prior Function Level of Independence: Needs assistance with ADLs;Requires assistive device for independence;Needs assistance with gait  Able to Take Stairs?: Yes Driving: No 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) Cognition Overall Cognitive Status: Impaired/Different from baseline Arousal/Alertness: Awake/alert Orientation Level: Oriented to person;Oriented to place;Oriented to time;Oriented to situation Year: 2024 Month: October Day of Week: Correct Attention: Sustained Sustained Attention: Impaired Sustained Attention Impairment: Functional basic Memory: Impaired Memory Impairment: Storage deficit;Retrieval deficit;Decreased recall of new information Awareness: Impaired Awareness Impairment: Intellectual impairment;Emergent impairment;Anticipatory impairment Problem Solving: Impaired Problem Solving Impairment: Verbal basic;Functional basic Executive Function: Sequencing;Organizing;Self Monitoring;Self Correcting Sequencing: Impaired Sequencing Impairment: Verbal basic Organizing: Impaired Organizing Impairment: Verbal basic Self Monitoring: Impaired Self Monitoring Impairment: Functional basic Self Correcting: Impaired Self Correcting Impairment: Functional basic Safety/Judgment: Appears intact Comments: n/a Sensation Sensation Light Touch: Appears Intact Hot/Cold: Appears Intact Proprioception: Not tested Stereognosis: Not tested Coordination Gross Motor Movements are Fluid and Coordinated: No Fine Motor Movements are Fluid and Coordinated: No Coordination and Movement Description: decreased smoothness and accuracy with LUE/LLE since brain tumor resection in 2022 Motor  Motor Motor: Hemiplegia Motor - Skilled Clinical  Observations: L hemi since brain surgery 2022, deconditioned  Trunk/Postural Assessment  Cervical Assessment Cervical Assessment: Exceptions to [facility] (R lateral flexion/cervical rotation preference, can correct with cues) Thoracic Assessment Thoracic Assessment: Exceptions to [facility] (rounded shoulders) Lumbar Assessment Lumbar Assessment: Exceptions to [facility] (posterior pelvic tilt) Postural Control Postural Control: Deficits on evaluation Head Control: R lateral flexion/cervical rotation preference, can correct with cues Trunk Control: deconditioned, R lateral trunk lean/posterior lean Righting Reactions: delayed and inadequate Protective Responses: decreased Postural Limitations: decreased  Balance Balance Balance Assessed: Yes Static Sitting Balance Static Sitting - Balance Support: Feet supported;Bilateral upper extremity supported Static Sitting - Level of Assistance: 4: Min assist Dynamic Sitting Balance Dynamic Sitting - Balance Support: Feet unsupported;Bilateral upper extremity supported Dynamic Sitting - Level of Assistance: 3: Mod assist Static Standing Balance Static Standing - Balance Support: During functional activity;Bilateral upper extremity supported Static Standing - Level of Assistance: 3: Mod assist Dynamic Standing Balance Dynamic Standing - Balance Support: During functional activity;Bilateral upper extremity supported Dynamic Standing - Level of Assistance: 3: Mod assist Dynamic Standing - Comments: with and without RW Extremity Assessment  RLE Assessment RLE Assessment: Exceptions to [facility] RLE Strength RLE Overall Strength: Deficits Right Hip Flexion: 3+/5 Right Knee Flexion: 3+/5 Right Knee Extension: 4/5 Right Ankle Dorsiflexion: 3+/5 Right Ankle Plantar Flexion: 3/5 LLE Assessment LLE Assessment: Exceptions to [facility] LLE Strength Left Hip Flexion: 3+/5 Left Knee Flexion: 3+/5 Left Knee Extension: 4/5 Left Ankle Dorsiflexion: 3+/5 Left Ankle Plantar  Flexion: 3/5  Care Tool Care Tool Bed Mobility Roll left and right activity   Roll left and right assist level: Moderate Assistance - Patient 50 - 74%    Sit to lying activity   Sit to lying assist level: Moderate Assistance - Patient 50 - 74%    Lying to sitting on side of bed activity   Lying to sitting on side of bed assist level: the ability to move from lying on the back to sitting on the side of the bed with no back support.: Moderate Assistance - Patient 50 - 74%     Care Tool Transfers Sit to stand transfer   Sit to stand assist level: Moderate Assistance - Patient 50 - 74%    Chair/bed transfer   Chair/bed transfer assist level: Moderate Assistance - Patient  50 - 74%     Scientist, research (physical sciences) transfer activity did not occur: Safety/medical concerns        Care Tool Locomotion Ambulation   Assist level: 2 helpers Assistive device: Walker-rolling Max distance: 5'  Walk 10 feet activity Walk 10 feet activity did not occur: Safety/medical concerns       Walk 50 feet with 2 turns activity Walk 50 feet with 2 turns activity did not occur: Safety/medical concerns      Walk 150 feet activity Walk 150 feet activity did not occur: Safety/medical concerns      Walk 10 feet on uneven surfaces activity Walk 10 feet on uneven surfaces activity did not occur: Safety/medical concerns      Stairs Stair activity did not occur: Safety/medical concerns        Walk up/down 1 step activity Walk up/down 1 step or curb (drop down) activity did not occur: Safety/medical concerns      Walk up/down 4 steps activity Walk up/down 4 steps activity did not occur: Safety/medical concerns      Walk up/down 12 steps activity Walk up/down 12 steps activity did not occur: Safety/medical concerns      Pick up small objects from floor Pick up small object from the floor (from standing position) activity did not occur: Safety/medical concerns      Wheelchair Is the  patient using a wheelchair?: Yes Type of Wheelchair: Manual   Wheelchair assist level: Dependent - Patient 0%    Wheel 50 feet with 2 turns activity   Assist Level: Dependent - Patient 0%  Wheel 150 feet activity   Assist Level: Dependent - Patient 0%    Refer to Care Plan for Long Term Goals  SHORT TERM GOAL WEEK 1 PT Short Term Goal 1 (Week 1): Pt will ambulate 25' with RW min assist PT Short Term Goal 2 (Week 1): Pt will complete up/down 1 step with BUE support mod assist PT Short Term Goal 3 (Week 1): Pt will complete transfers with min assist and LRAD PT Short Term Goal 4 (Week 1): Pt will complete bed mobility with min assist  Recommendations for other services: None   Skilled Therapeutic Intervention Evaluation completed (see details above and below) with education on PT POC and goals and individual treatment initiated with focus on gait training, therapeutic exercise, and therapeutic activities such as transfer training and education on DME. Pt reporting pain in back from radiation, receives pain medication at start of session. Pt completes bed mobility with mod assist for trunk to upright and repositioning in supine. Pt completes sit<>stand transfer with mod assist with therapist positioned in front of pt, pt holding to therapist elbows, pt able to initiate side stepping bilaterally at EOB with mod assist slight L knee buckle without LOB. Pt completes stand pivot transfer to L with mod assist therapist positioned in front of pt, assist for hips to WC. Pt also able to complete stand pivot back to bed with RW min/mod assist improved stability and upright posture as well as positioning prior to sit. Pt transported to day room via WC dependently due to pt fatigue and positioning in standard WC. Pt completes x3 gait trials, 2x5' with bilateral HHA mod assistx2  and 3rd person WC follow, last trial x5' with RW mod assist with 2nd person stand by and 3rd person WC follow. Pt husband present  and actively participates with session, pt demonstrates some anxiety and  performance does seem to improve with husband's presence. Pt completes seated therex for BLE and core strengthening including x20 marches, x20 LAQs, and x10 BLE hip abduction, pt requires frequent rest breaks due to fatigue. Pt returned to room and remains supine in bed with all needs within reach, call light in place, bed alarm activated and pt RN and husband present at bedside at end of session.   Mobility Bed Mobility Bed Mobility: Sit to Supine;Supine to Sit Supine to Sit: Moderate Assistance - Patient 50-74% Sit to Supine: Moderate Assistance - Patient 50-74% Transfers Transfers: Stand to Sit;Sit to Stand;Stand Pivot Transfers Sit to Stand: Moderate Assistance - Patient 50-74% Stand to Sit: Moderate Assistance - Patient 50-74% Stand Pivot Transfers: Moderate Assistance - Patient 50 - 74% Stand Pivot Transfer Details: Verbal cues for sequencing;Verbal cues for gait pattern;Verbal cues for technique;Verbal cues for precautions/safety Stand Pivot Transfer Details (indicate cue type and reason): cues for sequencing Transfer (Assistive device): None Locomotion  Gait Ambulation: Yes Gait Assistance: 2 Helpers Gait Distance (Feet): 5 Feet Assistive device: 2 person hand held assist Gait Assistance Details: Tactile cues for initiation;Verbal cues for sequencing;Verbal cues for technique;Verbal cues for precautions/safety;Verbal cues for gait pattern Gait Assistance Details: 2 person HHA and 3rd person WC follow Gait Gait: Yes Gait Pattern: Impaired Gait Pattern: Step-to pattern;Shuffle;Wide base of support;Decreased hip/knee flexion - right;Decreased stride length Gait velocity: decreased Stairs / Additional Locomotion Stairs: No Wheelchair Mobility Wheelchair Mobility: No   Discharge Criteria: Patient will be discharged from PT if patient refuses treatment 3 consecutive times without medical reason, if treatment  goals not met, if there is a change in medical status, if patient makes no progress towards goals or if patient is discharged from hospital.  The above assessment, treatment plan, treatment alternatives and goals were discussed and mutually agreed upon: by patient and by family  Edwin Cap PT, DPT 06/11/2023, 4:14 PM

## 2023-06-11 NOTE — Plan of Care (Signed)
  Problem: RH BOWEL ELIMINATION Goal: RH STG MANAGE BOWEL WITH ASSISTANCE Description: STG Manage Bowel with toileting Assistance. Outcome: Progressing Goal: RH STG MANAGE BOWEL W/MEDICATION W/ASSISTANCE Description: STG Manage Bowel with Medication with mod I Assistance. Outcome: Progressing   Problem: RH BLADDER ELIMINATION Goal: RH STG MANAGE BLADDER WITH ASSISTANCE Description: STG Manage Bladder With toileting Assistance Outcome: Progressing Goal: RH STG MANAGE BLADDER WITH MEDICATION WITH ASSISTANCE Description: STG Manage Bladder With Medication With mod I Assistance. Outcome: Progressing   Problem: RH SKIN INTEGRITY Goal: RH STG SKIN FREE OF INFECTION/BREAKDOWN Description: Manage w min assist Outcome: Progressing Goal: RH STG MAINTAIN SKIN INTEGRITY WITH ASSISTANCE Description: STG Maintain Skin Integrity With min Assistance. Outcome: Progressing   Problem: RH SAFETY Goal: RH STG ADHERE TO SAFETY PRECAUTIONS W/ASSISTANCE/DEVICE Description: STG Adhere to Safety Precautions With cues Assistance/Device. Outcome: Progressing   Problem: RH PAIN MANAGEMENT Goal: RH STG PAIN MANAGED AT OR BELOW PT'S PAIN GOAL Description: < 4 with prns Outcome: Progressing   Problem: RH KNOWLEDGE DEFICIT GENERAL Goal: RH STG INCREASE KNOWLEDGE OF SELF CARE AFTER HOSPITALIZATION Description: Patient and spouse will be able to manage care at discharge with medications and dietary modification using educational resources independently Outcome: Progressing   Problem: Consults Goal: RH GENERAL PATIENT EDUCATION Description: See Patient Education module for education specifics. Outcome: Progressing

## 2023-06-11 NOTE — Patient Care Conference (Signed)
Inpatient RehabilitationTeam Conference and Plan of Care Update Date: 06/11/2023   Time: 10:40 AM    Patient Name: Virginia [name]      Medical Record Number: [id number]  Date of Birth: September 25, 1955 Sex: Female         Room/Bed: 4W23C/4W23C-01 Payor Info: Payor: MEDICARE / Plan: MEDICARE PART A AND B / Product Type: *No Product type* /    Admit Date/Time:  06/10/2023  5:08 PM  Primary Diagnosis:  Cardiac arrest [facility])  Hospital Problems: Principal Problem:   Cardiac arrest [facility]) Active Problems:   Lung cancer metastatic to brain (HCC)   Debility   History of DVT (deep vein thrombosis)   Urine retention   Acute blood loss anemia   History of ventricular fibrillation    Expected Discharge Date: Expected Discharge Date:  (Evals pending)  Team Members Present: Physician leading conference: Dr. Elijah Birk Social Worker Present: Lavera Guise, BSW Nurse Present: Chana Bode, RN PT Present: Darrold Span, PT OT Present: Kearney Hard, OT SLP Present: Feliberto Gottron, SLP PPS Coordinator present : Edson Snowball, PT     Current Status/Progress Goal Weekly Team Focus  Bowel/Bladder   Patient currently has a foley.  Continent of bowel.   To remain infection free.  Remove foley.   Foley is coming out today.  goal is for patient to be able to  urinate once removed.    Swallow/Nutrition/ Hydration               ADL's   Max A of 1   CGA/supervision   self-care retraining, activity tolerance, generalized strengthening,    Mobility   eval pending           Communication   Eval Pending            Safety/Cognition/ Behavioral Observations  Eval Pending            Pain   Patient uses 1-10 pain scale.  She has PRN pain medications.   Cotninue to have well controlled pain.   WHile she is a full code, palliative could still see her.  It might be a good idea to start thinking about how and at what point to start ramping up pain meds as pain increases.     Skin   Patient has intact skin.   Skin to remain intact.  with foley conming out, attention to keeping patient dry and hygenic.      Discharge Planning:  Nee evaluation today-home with husband who was assisting prior to admission. Has been staying here with her since admission   Team Discussion: Patient with anemia and hypo-magnesium post cardiac arrest. Limited by mid back pain and fatigue.  Patient on target to meet rehab goals: yes, currently needs min assist for ADLs in the recent past however today needed max assist. All other evals pending.  *See Care Plan and progress notes for long and short-term goals.   Revisions to Treatment Plan:  N/a  Teaching Needs: Safety, medications, dietary modification, skin care, transfers, toileting,etc.   Current Barriers to Discharge: Home enviroment access/layout and Wound care  Possible Resolutions to Barriers: Family education     Medical Summary Current Status: medically complicated by cardiac abnormalities, CA with bone and brain mets, DVTS, hypomagnasemia, urinary retention, AKi, and pain in back and ribs, DTI on back and sacrum  Barriers to Discharge: Cardiac Complications;Complicated Wound;Electrolyte abnormality;Medical stability;Self-care education;Uncontrolled Pain;Inadequate Nutritional Intake  Barriers to Discharge Comments: pain management with pathologic mets/fractures, cardiac complications, electrolyte  abnormalities, urinary retention Possible Resolutions to [employer] Focus: pain management, monitor labs and vitals, adjust cardiac medications as needed, encourage POs   Continued Need for Acute Rehabilitation Level of Care: The patient requires daily medical management by a physician with specialized training in physical medicine and rehabilitation for the following reasons: Direction of a multidisciplinary physical rehabilitation program to maximize functional independence : Yes Medical management of patient  stability for increased activity during participation in an intensive rehabilitation regime.: Yes Analysis of laboratory values and/or radiology reports with any subsequent need for medication adjustment and/or medical intervention. : Yes   I attest that I was present, lead the team conference, and concur with the assessment and plan of the team.   Chana Bode B 06/11/2023, 3:00 PM

## 2023-06-11 NOTE — Progress Notes (Signed)
Inpatient Rehabilitation Care Coordinator Assessment and Plan Patient Details  Name: Virginia [name] MRN: [id number] Date of Birth: 06/26/1956  Today's Date: 06/11/2023  Hospital Problems: Principal Problem:   Cardiac arrest [facility]) Active Problems:   Lung cancer metastatic to brain [facility])   Debility   History of DVT (deep vein thrombosis)   Urine retention   Acute blood loss anemia   History of ventricular fibrillation  Past Medical History:  Past Medical History:  Diagnosis Date   Anemia    Anxiety    Concussion 09/28/2019   DVT (deep venous thrombosis) (HCC) 2021   L leg   Dyspnea    GERD (gastroesophageal reflux disease)    Hypercholesterolemia    per pt, she does not have elevated lipids   Hypertension    met lung ca dx'd 09/2019   mets to spine, hip and brain   PONV (postoperative nausea and vomiting)    Tobacco abuse    Past Surgical History:  Past Surgical History:  Procedure Laterality Date   ABDOMINAL HYSTERECTOMY     partial/ left ovaries   APPLICATION OF CRANIAL NAVIGATION N/A 12/02/2020   Procedure: APPLICATION OF CRANIAL NAVIGATION;  Surgeon: Maeola Harman, MD;  Location: [facility] OR;  Service: Neurosurgery;  Laterality: N/A;   CHOLECYSTECTOMY     CRANIOTOMY N/A 12/02/2020   Procedure: Posterior fossa craniotomy for tumor resection with brainlab;  Surgeon: Maeola Harman, MD;  Location: [facility] OR;  Service: Neurosurgery;  Laterality: N/A;   DILATION AND CURETTAGE OF UTERUS     IR IMAGING GUIDED PORT INSERTION  10/23/2019   IR PATIENT EVAL TECH 0-60 MINS  11/21/2021   IR PATIENT EVAL TECH 0-60 MINS  11/24/2021   IR PATIENT EVAL TECH 0-60 MINS  11/29/2021   IR PATIENT EVAL TECH 0-60 MINS  12/04/2021   IR PATIENT EVAL TECH 0-60 MINS  12/12/2021   IR PATIENT EVAL TECH 0-60 MINS  12/19/2021   IR PATIENT EVAL TECH 0-60 MINS  12/27/2021   IR PATIENT EVAL TECH 0-60 MINS  01/03/2022   IR PATIENT EVAL TECH 0-60 MINS  01/10/2022   IR PATIENT EVAL TECH 0-60 MINS  01/18/2022   IR  PATIENT EVAL TECH 0-60 MINS  02/13/2022   IR PATIENT EVAL TECH 0-60 MINS  02/20/2022   IR RADIOLOGIST EVAL & MGMT  01/23/2022   IR REMOVAL TUN ACCESS W/ PORT W/O FL MOD SED  11/17/2021   KYPHOPLASTY N/A 03/15/2020   Procedure: Thoracic Eight KYPHOPLASTY;  Surgeon: Maeola Harman, MD;  Location: [facility] OR;  Service: Neurosurgery;  Laterality: N/A;  prone    LIPOMA EXCISION  2018   removed under left breast and right thigh.   TUBAL LIGATION     Social History:  reports that she quit smoking about 10 years ago. Her smoking use included cigarettes. She has never used smokeless tobacco. She reports current alcohol use. She reports that she does not use drugs.  Family / Support Systems Marital Status: Married How Long?: 35 years Patient Roles: Spouse, Parent, Other (Comment) (business owner) Spouse/Significant Other: Virginia [name] [phone]  [phone] Children: Virginia [name]-daughter [phone] Husband has two children and pt has three children-all but one are local Other Supports: friends and neighbors Anticipated Caregiver: Randy Ability/Limitations of Caregiver: Husband is in good health and can provide 24/7 care and was assisting some prior to admission Caregiver Availability: 24/7 Family Dynamics: Close with all children who are suppportive and involved. Pt feels very blessed to have the supports she does.  Social History  Preferred language: English Religion: Christian Cultural Background: No issues Education: Charity fundraiser - How often do you need to have someone help you when you read instructions, pamphlets, or other written material from your doctor or pharmacy?: Never Writes: Yes Employment Status: Retired Date Retired/Disabled/Unemployed: own Tree surgeon Issues: No issues Guardian/Conservator: None-according to MD pt is capable of making her own decisions while here. Husband has been staying with her and will be here also   Abuse/Neglect Abuse/Neglect  Assessment Can Be Completed: Yes Physical Abuse: Denies Verbal Abuse: Denies Sexual Abuse: Denies Exploitation of patient/patient's resources: Denies Self-Neglect: Denies  Patient response to: Social Isolation - How often do you feel lonely or isolated from those around you?: Never  Emotional Status Pt's affect, behavior and adjustment status: Pt is doing quite well considering what she has been through. She is motivated to continue to progress and get stronger and go home. She hopes to get back to her functional level of moving around on her own. Her husband is available to assist if needed. Stairs are an issue for her. Recent Psychosocial Issues: other health issues-cancer and treatments Psychiatric History: History of anxiety takes medications for this and finds them helpful. May benefit to see neuro-psych while here due to all she has dealt with since 2021 Substance Abuse History: No issues-past hx-smoking  Patient / Family Perceptions, Expectations & Goals Pt/Family understanding of illness & functional limitations: Pt and husband can explain her reason for being in the hospital. Pt has no memory of the event which is good. Husband was there and assisted. Both do talk with the MD's involved and feel they have a good understanding of her plan moving forward. Premorbid pt/family roles/activities: mom, grandmother, wife, retiree, neighbor, etc Anticipated changes in roles/activities/participation: resume Pt/family expectations/goals: Pt states: " I hope do get stronger and do well here and get back home."  [employer]: None Premorbid Home Care/DME Agencies: Other (Comment) (recent referral for home health-[facility] seeing. has rw, wc, bsc, tub seat) Transportation available at discharge: husband Is the patient able to respond to transportation needs?: Yes In the past 12 months, has lack of transportation kept you from medical appointments or from getting  medications?: No In the past 12 months, has lack of transportation kept you from meetings, work, or from getting things needed for daily living?: No Resource referrals recommended: Neuropsychology  Discharge Planning Living Arrangements: Spouse/significant other Support Systems: Spouse/significant other, Children, Parent, Other relatives, Friends/neighbors Type of Residence: Private residence Insurance Resources: [employer], Media planner (specify) ([employer]) Surveyor, quantity Resources: Social Security, Family Support Financial Screen Referred: No Living Expenses: Own Money Management: Spouse Does the patient have any problems obtaining your medications?: No Home Management: both mostly husband Patient/Family Preliminary Plans: Return home with husband who is her caregiver. Made aware being evaluated today and goals being set for stay here. Pt's husband can provide 24/7 care if needed. Care Coordinator Anticipated Follow Up Needs: HH/OP  Clinical Impression Pleasant female who looks good with all she has gone through recently. Husband and children are very involved and supportive and will be assisting at discharge. Being evaluated today and goals being set for stay here. Will place on neuro-psych list to be seen while here.  Lucy Chris 06/11/2023, 10:35 AM

## 2023-06-11 NOTE — Progress Notes (Signed)
Inpatient Rehabilitation  Patient information reviewed and entered into eRehab system by Demarrio Menges Kamaria Lucia, OTR/L, Rehab Quality Coordinator.   Information including medical coding, functional ability and quality indicators will be reviewed and updated through discharge.   

## 2023-06-11 NOTE — Discharge Summary (Signed)
Physician Discharge Summary  Patient ID: Virginia [name] MRN: [id number] DOB/AGE: Nov 30, 1955 67 y.o.  Admit date: 06/10/2023 Discharge date: 07/02/2023  Discharge Diagnoses:  Principal Problem:   Cardiac arrest [facility]) Active Problems:   Lung cancer metastatic to brain [facility])   Debility   History of DVT (deep vein thrombosis)   Urine retention   Acute blood loss anemia   History of ventricular fibrillation   Hypomagnesemia Anemia Acute lung injury Hypertension Bilateral rib fractures AKI Chronic dizziness Constipation   Discharged Condition: stable  Significant Diagnostic Studies: none  Labs:  Basic Metabolic Panel: Recent Labs  Lab 06/28/23 0756 06/29/23 0816 07/01/23 0554  NA 143 140 143  K 4.2 3.7 4.3  CL 108 107 106  CO2 25 24 27   GLUCOSE 156* 102* 104*  BUN 37* 37* 35*  CREATININE 1.44* 1.32* 1.29*  CALCIUM 9.1 8.4* 9.1  MG 2.0 1.9 1.9    CBC: Recent Labs  Lab 06/29/23 0816 07/01/23 0554 07/02/23 0730  WBC 8.9 7.9  --   NEUTROABS 4.6  --   --   HGB 8.5* 7.8* 8.7*  HCT 25.7* 24.3* 26.7*  MCV 105.8* 106.6*  --   PLT 243 239  --     CBG: Recent Labs  Lab 07/01/23 0556 07/01/23 1205 07/01/23 1700 07/01/23 2042 07/02/23 0531  GLUCAP 96 102* 172* 121* 94    Brief HPI:   Virginia [name] is a 67 y.o. female who suffered a + cardiac arrest. Presenting rhythm was apparently VF, but family felt that the arrest may have been brought on by a seizure. Estimated downtime greater than 20 minutes. On ED arrival patient was seen hemodynamically stable but emergently intubated for airway protection.   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  She had nevertheless responded well to recent treatment. She also has a history of prior DVT, hypertension, hyperlipidemia, tobacco abuse and anxiety. S/p craniotomy, resection of progressive cerebellar mass by Dr. Venetia Maxon on 12/02/2020.  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    Extubated on  9/25. Transitioned to oral amiodarone. Not a candidate for [facility] due to ? prior brain bleed while on Eliquis in setting of brain mets. Unclear reason for no pharmacologic DVT prophylaxis. TTE notes diminished EF at 45-50% with global hypokinesis - Cardiology agree she is not a candidate for invasive intervention and suggests continuing amiodarone. EEG 9/23 suggestive of severe diffuse encephalopathy with no seizure or epileptiform discharges noted - no active seizure activity appreciated since hospitalization - it is certainly possible that rather than seizure she was experiencing arrhythmia related syncope leading to her respiratory irregularities though she of course would be at risk for seizure activity as well - plan to continue Keppra indefinitely. Decadron taper for acute lung injury.   Per Dr. Barbaraann Cao note on 05/23/2023 neuro exam: Cranial nerves: noted dysarthria Motor: Dense dysmetria, coordination impaired bilaterally. Sensory: Intact to light touch Gait: Non ambulatory today. Pt and husband state she was ambulatory with RW/rollator and able to climb stairs prior to admission.   A1c = 7.3% start carb modified diet and SSI. Voiding trial if not already done. Discussed anemia, DM and DVT prophylaxis (at minimum TED hose) with patient and her husband. Check stool for occult blood.    Hospital Course: Virginia [name] was admitted to rehab 06/10/2023 for inpatient therapies to consist of PT, ST and OT at least three hours five days a week. Past admission physiatrist, therapy team and rehab  RN have worked together to provide customized collaborative inpatient rehab. Follow-up labs stable except magnesium of 1.6. Given supplementation. Pain regimen adjusted. Voiding spontaneously and Urecholine discontinued. Follow-up labs 10/02 magnesium 1.9, potassium 4.0, normal serum vitamin D. Agreeable to trying scopolamine patch as adjunctive to help with dizziness. Discussed home medication regimen for pain control on  10/03, usually uses long-acting oxycodone 13.5 mg twice daily and oxycodone 5 mg 3-4 times daily, every 4 hours as needed. Agreeable to returning to this regimen for short acting. Hgb stable 10/05. Scheduled meclizine 25 mg TID per patient request. Increased potassium supplementation on 10/06. Increasing LE edema noted on 10/09 and consulted cardiology regarding appropriate management per their previous recommendations>>Dr. Hilty: "Start aldactone 25 mg daily -monitor potassium. Follow weights, urine output and edema on exam to demonstrate improvement. May ultimately need a low dose loop diuretic. While there is mild LV dysfunction on echo, suspect more of the edema is related to third-spacing, contributed to be hypoalbuminemia and protein-calorie malnutrition. Would continue to work on improving fed-state. No further suggestions at this time. Follow-up has been arranged with Tereso Newcomer, [name] on 10/22." Dexamethasone taper completed. Contacted palliative care team on 10/10 to revisit caregiver role and end-of-life considerations. Discussed with Dr. Barbaraann Cao steroid therapy and decision to resume 1 mg daily until follow-up with him as outpatient. Hgb improving and up to 8.3 on 10/14. She walked 80+ feet on 10/15. Started Toprol XL 25 mg daily on 10/17.  10/18: Up 5 kg today? Cr increased; gross signs of volume overload on exam.  Chest x-ray read pending, but showing increased central vascular prominence.  No apparent effusions.  20 mg IV Lasix today, then increase spironolactone to 25 mg daily starting 10/19 a.m.  Per cardiology, may need loop diuretic added as outpatient. TED hose when OOB for edema.  Serum iron and ferritin obtained 10/21>>ferritin elevated at 458 all other values are normal.    Blood pressures were monitored on TID basis and home Norvasc was held. Started Aldactone 25 mg for edema on 10/10 and reduced to 12.5 mg on 1/12. Pacerone 200 mg daily discontinued 10/17. Toprol XL 25 mg daily started on  10/17.   Diabetes has been monitored with ac/hs CBG checks and SSI was use prn for tighter BS control. Required only occasional coverage. Discontinue SSI at discharge.   Rehab course: During patient's stay in rehab weekly team conferences were held to monitor patient's progress, set goals and discuss barriers to discharge. At admission, patient required mod to max assist with basic self-care skills and  mod with mobility.   She  has had improvement in activity tolerance, balance, postural control as well as ability to compensate for deficits. She has had improvement in functional use RUE/LUE  and RLE/LLE as well as improvement in awareness Patient has met 4 of 6 long term goals due to improved activity tolerance, improved balance, improved postural control, increased strength, ability to compensate for deficits, improved awareness, and improved coordination.  Patient to discharge at an ambulatory level Min Assist.   Patient's care partner is independent to provide the necessary physical assistance at discharge.    The patient is medically ready for discharge to home and will need follow-up with [facility]. In addition, they will need to follow up with their PCP,  oncology, neuro-oncology and cardiology.   Discharge disposition: 06-Home-Health Care Svc     Diet: heart healthy  Special Instructions:  No driving, alcohol consumption or tobacco use.  Continue Decadron 10  mg daily until follow-up with Dr. Barbaraann Cao  30-35 minutes were spent on discharge planning and discharge summary. Allergies as of 07/02/2023       Reactions   Scopolamine Rash        Medication List     STOP taking these medications    amLODipine 10 MG tablet Commonly known as: NORVASC   EPINEPHrine 0.3 mg/0.3 mL Soaj injection Commonly known as: EPI-PEN   folic acid 1 MG tablet Commonly known as: FOLVITE   furosemide 20 MG tablet Commonly known as: LASIX   GLUCOSAMINE PO   OLANZapine zydis 5 MG  disintegrating tablet Commonly known as: ZYPREXA Replaced by: OLANZapine 2.5 MG tablet   vitamin E 180 MG (400 UNITS) capsule       TAKE these medications    (feeding supplement) PROSource Plus liquid Take 30 mLs by mouth 2 (two) times daily between meals.   feeding supplement Liqd Take 237 mLs by mouth 2 (two) times daily between meals.   acetaminophen 325 MG tablet Commonly known as: TYLENOL Take 1-2 tablets (325-650 mg total) by mouth every 4 (four) hours as needed for mild pain (pain score 1-3). What changed:  medication strength how much to take when to take this reasons to take this   albuterol 108 (90 Base) MCG/ACT inhaler Commonly known as: VENTOLIN HFA INHALE 2 PUFFS INTO THE LUNGS EVERY 6 HOURS AS NEEDED FOR WHEEZING OR SHORTNESS OF BREATH   ALPRAZolam 0.25 MG tablet Commonly known as: XANAX Take 1 tablet (0.25 mg total) by mouth 2 (two) times daily as needed for anxiety. What changed: See the new instructions.   bisacodyl 5 MG EC tablet Commonly known as: DULCOLAX Take 1 tablet (5 mg total) by mouth daily as needed for moderate constipation.   dexamethasone 2 MG tablet Commonly known as: DECADRON Take 0.5 tablets (1 mg total) by mouth daily. What changed:  medication strength See the new instructions.   hydrocortisone 2.5 % rectal cream Commonly known as: ANUSOL-HC use in rectal area 2 to 3 times a day as needed What changed:  how much to take how to take this when to take this reasons to take this additional instructions   levETIRAcetam 1000 MG tablet Commonly known as: KEPPRA Take 1 tablet (1,000 mg total) by mouth 2 (two) times daily.   LYSINE PO Take 2 tablets by mouth daily.   magnesium oxide 400 (240 Mg) MG tablet Commonly known as: MAG-OX Take 1 tablet (400 mg total) by mouth 2 (two) times daily.   meclizine 25 MG tablet Commonly known as: ANTIVERT Take 2 tablets (50 mg total) by mouth 3 (three) times daily. What changed:  how  much to take when to take this reasons to take this   metoprolol succinate 25 MG 24 hr tablet Commonly known as: TOPROL-XL Take 1 tablet (25 mg total) by mouth daily.   multivitamin with minerals Tabs tablet Take 1 tablet by mouth daily.   OLANZapine 2.5 MG tablet Commonly known as: ZYPREXA Take 1 tablet (2.5 mg total) by mouth at bedtime. Replaces: OLANZapine zydis 5 MG disintegrating tablet   ondansetron 4 MG tablet Commonly known as: ZOFRAN TAKE 1 TABLET(4 MG) BY MOUTH EVERY 8 HOURS AS NEEDED FOR NAUSEA OR VOMITING   OVER THE COUNTER MEDICATION Take 2 tablets by mouth daily. Beet root   oxyCODONE 5 MG immediate release tablet Commonly known as: Oxy IR/ROXICODONE Take 1 tablet (5 mg total) by mouth every 4 (four) hours as needed for  severe pain.   pantoprazole 40 MG tablet Commonly known as: PROTONIX Take 1 tablet (40 mg total) by mouth daily.   polyethylene glycol 17 g packet Commonly known as: MIRALAX / GLYCOLAX Take 17 g by mouth every evening.   potassium chloride SA 20 MEQ tablet Commonly known as: KLOR-CON M Take 1 tablet (20 mEq total) by mouth 2 (two) times daily. What changed: how much to take   senna-docusate 8.6-50 MG tablet Commonly known as: Senokot-S Take 2 tablets by mouth daily as needed (Patient preference, Sennakot S for mild constipation).   spironolactone 25 MG tablet Commonly known as: ALDACTONE Take 1 tablet (25 mg total) by mouth daily.   VISINE OP Place 1 drop into both eyes daily as needed (irritation).   VITAMIN B12 PO Take 1 tablet by mouth daily. Gummies   VITAMIN D PO Take 1 capsule by mouth every evening.   Xtampza ER 13.5 MG C12a Generic drug: oxyCODONE ER Take 1 capsule by mouth in the morning and at bedtime. (every 12 hours)        Follow-up Information     Baxley, Luanna Cole, MD Follow up.   Specialty: Internal Medicine Why: Call the office in 1-2 days to make arrangements for hospital follow-up appointment. Contact  information: [address] [place] [address] [phone]         Elijah Birk C, DO Follow up.   Specialty: Physical Medicine and Rehabilitation Why: office will call you to arrange your appt (sent) Contact information: [address] [place] [address] [phone]         Orbie Pyo, MD. Go to.   Specialty: Cardiology Why: Call in 1-2 days for post hospital follow up Contact information: [address] [place] [address] [phone]         Henreitta Leber, MD Follow up.   Specialties: Psychiatry, Neurology, Oncology Why: Call in 1-2 days for post hospital follow up Contact information: [address] [place] [address] [phone]                 Signed: Milinda Antis 07/04/2023, 5:46 PM

## 2023-06-11 NOTE — Plan of Care (Signed)
  Problem: RH Cognition - SLP Goal: RH LTG Patient will demonstrate orientation with cues Description:  LTG:  Patient will demonstrate orientation to person/place/time/situation with cues (SLP)   Flowsheets (Taken 06/11/2023 1603) LTG Patient will demonstrate orientation to:  Person  Place  Time  Situation LTG: Patient will demonstrate orientation using cueing (SLP): Minimal Assistance - Patient > 75%   Problem: RH Problem Solving Goal: LTG Patient will demonstrate problem solving for (SLP) Description: LTG:  Patient will demonstrate problem solving for basic/complex daily situations with cues  (SLP) Flowsheets (Taken 06/11/2023 1603) LTG: Patient will demonstrate problem solving for (SLP): Basic daily situations LTG Patient will demonstrate problem solving for: Minimal Assistance - Patient > 75%   Problem: RH Memory Goal: LTG Patient will use memory compensatory aids to (SLP) Description: LTG:  Patient will use memory compensatory aids to recall biographical/new, daily complex information with cues (SLP) Flowsheets (Taken 06/11/2023 1603) LTG: Patient will use memory compensatory aids to (SLP): Minimal Assistance - Patient > 75%   Problem: RH Awareness Goal: LTG: Patient will demonstrate awareness during functional activites type of (SLP) Description: LTG: Patient will demonstrate awareness during functional activites type of (SLP) Flowsheets (Taken 06/11/2023 1603) Patient will demonstrate during cognitive/linguistic activities awareness type of: Emergent LTG: Patient will demonstrate awareness during cognitive/linguistic activities with assistance of (SLP): Minimal Assistance - Patient > 75%

## 2023-06-11 NOTE — Evaluation (Signed)
Occupational Therapy Assessment and Plan  Patient Details  Name: Virginia [name] MRN: [id number] Date of Birth: 1956/08/14  OT Diagnosis: hemiplegia affecting non-dominant side, muscle weakness (generalized), and debility Rehab Potential: Rehab Potential (ACUTE ONLY): Excellent ELOS: 3 weeks   Today's Date: 06/11/2023 OT Individual Time: 4259-5638 OT Individual Time Calculation (min): 68 min     Hospital Problem: Principal Problem:   Cardiac arrest [facility] Active Problems:   Lung cancer metastatic to brain (HCC)   Debility   History of DVT (deep vein thrombosis)   Urine retention   Acute blood loss anemia   History of ventricular fibrillation   Past Medical History:  Past Medical History:  Diagnosis Date   Anemia    Anxiety    Concussion 09/28/2019   DVT (deep venous thrombosis) (HCC) 2021   L leg   Dyspnea    GERD (gastroesophageal reflux disease)    Hypercholesterolemia    per pt, she does not have elevated lipids   Hypertension    met lung ca dx'd 09/2019   mets to spine, hip and brain   PONV (postoperative nausea and vomiting)    Tobacco abuse    Past Surgical History:  Past Surgical History:  Procedure Laterality Date   ABDOMINAL HYSTERECTOMY     partial/ left ovaries   APPLICATION OF CRANIAL NAVIGATION N/A 12/02/2020   Procedure: APPLICATION OF CRANIAL NAVIGATION;  Surgeon: Maeola Harman, MD;  Location: [facility] OR;  Service: Neurosurgery;  Laterality: N/A;   CHOLECYSTECTOMY     CRANIOTOMY N/A 12/02/2020   Procedure: Posterior fossa craniotomy for tumor resection with brainlab;  Surgeon: Maeola Harman, MD;  Location: [facility] OR;  Service: Neurosurgery;  Laterality: N/A;   DILATION AND CURETTAGE OF UTERUS     IR IMAGING GUIDED PORT INSERTION  10/23/2019   IR PATIENT EVAL TECH 0-60 MINS  11/21/2021   IR PATIENT EVAL TECH 0-60 MINS  11/24/2021   IR PATIENT EVAL TECH 0-60 MINS  11/29/2021   IR PATIENT EVAL TECH 0-60 MINS  12/04/2021   IR PATIENT EVAL TECH 0-60 MINS  12/12/2021    IR PATIENT EVAL TECH 0-60 MINS  12/19/2021   IR PATIENT EVAL TECH 0-60 MINS  12/27/2021   IR PATIENT EVAL TECH 0-60 MINS  01/03/2022   IR PATIENT EVAL TECH 0-60 MINS  01/10/2022   IR PATIENT EVAL TECH 0-60 MINS  01/18/2022   IR PATIENT EVAL TECH 0-60 MINS  02/13/2022   IR PATIENT EVAL TECH 0-60 MINS  02/20/2022   IR RADIOLOGIST EVAL & MGMT  01/23/2022   IR REMOVAL TUN ACCESS W/ PORT W/O FL MOD SED  11/17/2021   KYPHOPLASTY N/A 03/15/2020   Procedure: Thoracic Eight KYPHOPLASTY;  Surgeon: Maeola Harman, MD;  Location: [facility] OR;  Service: Neurosurgery;  Laterality: N/A;  prone    LIPOMA EXCISION  2018   removed under left breast and right thigh.   TUBAL LIGATION      Assessment & Plan Clinical Impression: Patient is a 67 y.o. year old female with recent admission to the hospital on 9/23 after suffering a witnessed out-of-hospital cardiac arrest. EP patient received 5 shocks with EMS prior to arrival with 2 rounds of epi. On ED arrival patient was seen hemodynamically stable but emergently intubated for airway protection; extubated 9/25.EEG: severe diffuse encephalopathy. HMx: metastatic lung cancer, prior DVT, HTN, HLD, tobacco abuse, and anxiety craniotomy and resection of tumor from L cerebellum. Patient transferred to CIR on 06/10/2023 .    Patient currently requires  max with basic self-care skills secondary to muscle weakness, decreased cardiorespiratoy endurance, decreased awareness, decreased problem solving, and decreased memory, and decreased sitting balance, decreased standing balance, decreased postural control, hemiplegia, and decreased balance strategies.  Prior to hospitalization, patient could complete BADL with supervision/CGA.  Patient will benefit from skilled intervention to decrease level of assist with basic self-care skills and increase independence with basic self-care skills prior to discharge home with care partner.  Anticipate patient will require 24 hour supervision and follow up home  health.  OT - End of Session Activity Tolerance: Tolerates < 10 min activity with changes in vital signs Endurance Deficit: Yes Endurance Deficit Description: rest breaks within BADL tasks OT Assessment Rehab Potential (ACUTE ONLY): Excellent OT Patient demonstrates impairments in the following area(s): Balance;Cognition;Endurance;Edema;Motor;Nutrition;Pain;Safety OT Basic ADL's Functional Problem(s): Grooming;Bathing;Dressing;Toileting OT Transfers Functional Problem(s): Toilet;Tub/Shower OT Additional Impairment(s): Fuctional Use of Upper Extremity OT Plan OT Intensity: Minimum of 1-2 x/day, 45 to 90 minutes OT Frequency: 5 out of 7 days OT Duration/Estimated Length of Stay: 3 weeks OT Treatment/Interventions: Balance/vestibular training;Cognitive remediation/compensation;Community reintegration;Discharge planning;Disease mangement/prevention;DME/adaptive equipment instruction;Functional electrical stimulation;Functional mobility training;Neuromuscular re-education;Pain management;Patient/family education;Psychosocial support;Self Care/advanced ADL retraining;Skin care/wound managment;Splinting/orthotics;Therapeutic Activities;Therapeutic Exercise;UE/LE Strength taining/ROM;UE/LE Coordination activities;Visual/perceptual remediation/compensation;Wheelchair propulsion/positioning OT Self Feeding Anticipated Outcome(s): n/a OT Basic Self-Care Anticipated Outcome(s): Supervision/CGA OT Toileting Anticipated Outcome(s): CGA OT Bathroom Transfers Anticipated Outcome(s): CGA OT Recommendation Patient destination: Home Follow Up Recommendations: Home health OT Equipment Recommended: To be determined Equipment Details: has a RW, rollator, shower chair, BSC, and hospital bed   OT Evaluation Precautions/Restrictions  Precautions Precautions: Fall Restrictions Weight Bearing Restrictions: No General   Vital Signs   Pain Pain Assessment Pain Scale: 0-10 Pain Score: 8  Pain  Intervention(s): Medication (See eMAR) Home Living/Prior Functioning Home Living Family/patient expects to be discharged to:: Private residence Living Arrangements: Spouse/significant other Available Help at Discharge: Family, Available 24 hours/day Type of Home: House Home Access: Stairs to enter Secretary/administrator of Steps: 4 Entrance Stairs-Rails: None (planning on getting handrails installed) Home Layout: Two level, Bed/bath upstairs, 1/2 bath on main level Alternate Level Stairs-Number of Steps: 15 (5 steps to a landing, then 10 more) Alternate Level Stairs-Rails: Right Bathroom Shower/Tub: Psychologist, counselling, Engineer, manufacturing systems: Handicapped height  Lives With: Spouse IADL History Homemaking Responsibilities: No Occupation: Retired Advertising account planner: 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 and walking the neighborhood Prior Function Level of Independence: Needs assistance with ADLs, Requires assistive device for independence (rollator downstairs and RW upstairs) Vision Baseline Vision/History: 0 No visual deficits Ability to See in Adequate Light: 0 Adequate Perception    Praxis   Cognition Cognition Overall Cognitive Status: Impaired/Different from baseline Arousal/Alertness: Awake/alert Orientation Level: Person;Place;Situation Person: Oriented Place: Oriented Situation: Oriented Memory: Impaired Problem Solving: Impaired Brief Interview for Mental Status (BIMS) Repetition of Three Words (First Attempt): 3 Temporal Orientation: Year: Correct Temporal Orientation: Month: Accurate within 5 days Temporal Orientation: Day: Incorrect Recall: "Sock": Yes, no cue required Recall: "Blue": Yes, no cue required Recall: "Bed": Yes, no cue required BIMS Summary Score: 14 Sensation Coordination Gross Motor Movements are Fluid and Coordinated: No Fine Motor Movements are Fluid and Coordinated: No Coordination and Movement Description: decreased smoothness and  accuracy with L hand since brain tumor resection in 2022 Motor  Motor Motor: Hemiplegia Motor - Skilled Clinical Observations: L hemi for the past two years, very deconditioned  Trunk/Postural Assessment     Balance Balance Balance Assessed: Yes Static Sitting Balance Static Sitting - Balance Support: Feet supported Static  Sitting - Level of Assistance: 4: Min assist Dynamic Sitting Balance Dynamic Sitting - Level of Assistance: 3: Mod assist Sitting balance - Comments: posterior LOB Static Standing Balance Static Standing - Balance Support: During functional activity Static Standing - Level of Assistance: 2: Max assist Dynamic Standing Balance Dynamic Standing - Balance Support: During functional activity Dynamic Standing - Level of Assistance: 2: Max assist Extremity/Trunk Assessment RUE Assessment RUE Assessment: Within Functional Limits LUE Assessment LUE Assessment: Exceptions to [facility] Active Range of Motion (AROM) Comments: limited to 70 degrees of FF, 3/5 strength on L side  Care Tool Care Tool Self Care Eating   Eating Assist Level: Set up assist    Oral Care    Oral Care Assist Level: Set up assist    Bathing   Body parts bathed by patient: Right arm;Left arm;Chest;Abdomen;Right upper leg;Left upper leg;Face Body parts bathed by helper: Left lower leg;Right lower leg;Buttocks;Front perineal area   Assist Level: Moderate Assistance - Patient 50 - 74%    Upper Body Dressing(including orthotics)   What is the patient wearing?: Pull over shirt   Assist Level: Minimal Assistance - Patient > 75%    Lower Body Dressing (excluding footwear)   What is the patient wearing?: Underwear/pull up Assist for lower body dressing: Total Assistance - Patient < 25%    Putting on/Taking off footwear   What is the patient wearing?: Non-skid slipper socks Assist for footwear: Total Assistance - Patient < 25%       Care Tool Toileting Toileting activity   Assist for  toileting: Total Assistance - Patient < 25%     Care Tool Bed Mobility Roll left and right activity        Sit to lying activity   Sit to lying assist level: Maximal Assistance - Patient 25 - 49%    Lying to sitting on side of bed activity   Lying to sitting on side of bed assist level: the ability to move from lying on the back to sitting on the side of the bed with no back support.: Moderate Assistance - Patient 50 - 74%     Care Tool Transfers Sit to stand transfer   Sit to stand assist level: Maximal Assistance - Patient 25 - 49%    Chair/bed transfer   Chair/bed transfer assist level: Maximal Assistance - Patient 25 - 49%     Toilet transfer   Assist Level: Maximal Assistance - Patient 24 - 49%     Care Tool Cognition  Expression of Ideas and Wants Expression of Ideas and Wants: 4. Without difficulty (complex and basic) - expresses complex messages without difficulty and with speech that is clear and easy to understand  Understanding Verbal and Non-Verbal Content Understanding Verbal and Non-Verbal Content: 3. Usually understands - understands most conversations, but misses some part/intent of message. Requires cues at times to understand   Memory/Recall Ability Memory/Recall Ability : Current season;That he or she is in a hospital/hospital unit   Refer to Care Plan for Long Term Goals  SHORT TERM GOAL WEEK 1 OT Short Term Goal 1 (Week 1): Patient will complete toilet transfer with mod A of 1 OT Short Term Goal 2 (Week 1): Patient will maintain sitting balance at EOB for 5 minutes in preparation for BADL task OT Short Term Goal 3 (Week 1): Patient will perform 1 step of LB dressing task  Recommendations for other services: Therapeutic Recreation  Pet therapy and Stress management   Skilled Therapeutic Intervention OT  eval completed addressing rehab process, OT purpose, POC, ELOS, and goals.  Bed level LB ADLs completed in supine and in long sitting. Bed pan used for  toileting due to not having a BSC. Sit<>stands with mod/max A and max A for transfers. 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 Pt has poor endurance and generalized weakness. See below for further details regarding BADL performance.  ADL ADL Eating: Set up Grooming: Setup Upper Body Bathing: Minimal assistance Lower Body Bathing: Maximal assistance Upper Body Dressing: Minimal assistance Lower Body Dressing: Maximal assistance Toileting: Maximal assistance Toilet Transfer: Maximal assistance Mobility  Bed Mobility Bed Mobility: Supine to Sit;Sit to Supine Supine to Sit: Moderate Assistance - Patient 50-74% Sit to Supine: Maximal Assistance - Patient 25-49% Transfers Sit to Stand: Moderate Assistance - Patient 50-74% Stand to Sit: Moderate Assistance - Patient 50-74%   Discharge Criteria: Patient will be discharged from OT if patient refuses treatment 3 consecutive times without medical reason, if treatment goals not met, if there is a change in medical status, if patient makes no progress towards goals or if patient is discharged from hospital.  The above assessment, treatment plan, treatment alternatives and goals were discussed and mutually agreed upon: by patient and by family  Mal Amabile 06/11/2023, 9:37 AM

## 2023-06-11 NOTE — Plan of Care (Signed)
  Problem: RH Balance Goal: LTG Patient will maintain dynamic sitting balance (PT) Description: LTG:  Patient will maintain dynamic sitting balance with assistance during mobility activities (PT) Flowsheets (Taken 06/11/2023 1622) LTG: Pt will maintain dynamic sitting balance during mobility activities with:: Supervision/Verbal cueing Goal: LTG Patient will maintain dynamic standing balance (PT) Description: LTG:  Patient will maintain dynamic standing balance with assistance during mobility activities (PT) Flowsheets (Taken 06/11/2023 1622) LTG: Pt will maintain dynamic standing balance during mobility activities with:: Contact Guard/Touching assist   Problem: Sit to Stand Goal: LTG:  Patient will perform sit to stand with assistance level (PT) Description: LTG:  Patient will perform sit to stand with assistance level (PT) Flowsheets (Taken 06/11/2023 1622) LTG: PT will perform sit to stand in preparation for functional mobility with assistance level: Contact Guard/Touching assist   Problem: RH Bed Mobility Goal: LTG Patient will perform bed mobility with assist (PT) Description: LTG: Patient will perform bed mobility with assistance, with/without cues (PT). Flowsheets (Taken 06/11/2023 1622) LTG: Pt will perform bed mobility with assistance level of: Supervision/Verbal cueing   Problem: RH Bed to Chair Transfers Goal: LTG Patient will perform bed/chair transfers w/assist (PT) Description: LTG: Patient will perform bed to chair transfers with assistance (PT). Flowsheets (Taken 06/11/2023 1622) LTG: Pt will perform Bed to Chair Transfers with assistance level: Contact Guard/Touching assist   Problem: RH Car Transfers Goal: LTG Patient will perform car transfers with assist (PT) Description: LTG: Patient will perform car transfers with assistance (PT). Flowsheets (Taken 06/11/2023 1622) LTG: Pt will perform car transfers with assist:: Contact Guard/Touching assist   Problem: RH  Ambulation Goal: LTG Patient will ambulate in controlled environment (PT) Description: LTG: Patient will ambulate in a controlled environment, # of feet with assistance (PT). Flowsheets (Taken 06/11/2023 1622) LTG: Pt will ambulate in controlled environ  assist needed:: Contact Guard/Touching assist LTG: Ambulation distance in controlled environment: 150' Note: With LRAD Goal: LTG Patient will ambulate in home environment (PT) Description: LTG: Patient will ambulate in home environment, # of feet with assistance (PT). Flowsheets (Taken 06/11/2023 1622) LTG: Pt will ambulate in home environ  assist needed:: Contact Guard/Touching assist LTG: Ambulation distance in home environment: 35' Note: With LRAD   Problem: RH Stairs Goal: LTG Patient will ambulate up and down stairs w/assist (PT) Description: LTG: Patient will ambulate up and down # of stairs with assistance (PT) Flowsheets (Taken 06/11/2023 1622) LTG: Pt will ambulate up/down stairs assist needed:: Contact Guard/Touching assist LTG: Pt will  ambulate up and down number of stairs: 4 Note: Without handrails to simulate home set up

## 2023-06-11 NOTE — Progress Notes (Signed)
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  Husband at bedside, inquiring about protein supplementation and lab work for today.  Otherwise, feeling well, no events overnight.   ROS: Denies fevers, chills, N/V, abdominal pain, constipation, diarrhea, SOB, cough, chest pain, new weakness or paraesthesias.    Objective:   No results found. Recent Labs    06/09/23 0356 06/11/23 0558  WBC 10.6* 10.2  HGB 7.7* 7.6*  HCT 22.3* 22.6*  PLT 258 312   Recent Labs    06/10/23 1200 06/11/23 0558  NA 139 139  K 3.9 3.8  CL 105 100  CO2 26 30  GLUCOSE 173* 121*  BUN 23 20  CREATININE 1.16* 1.18*  CALCIUM 8.7* 8.4*    Intake/Output Summary (Last 24 hours) at 06/11/2023 1324 Last data filed at 06/11/2023 1610 Gross per 24 hour  Intake --  Output 1800 ml  Net -1800 ml     Pressure Injury 06/03/23 Buttocks Bilateral Deep Tissue Pressure Injury - Purple or maroon localized area of discolored intact skin or blood-filled blister due to damage of underlying soft tissue from pressure and/or shear. purple (Active)  06/03/23 1740  Location: Buttocks  Location Orientation: Bilateral  Staging: Deep Tissue Pressure Injury - Purple or maroon localized area of discolored intact skin or blood-filled blister due to damage of underlying soft tissue from pressure and/or shear.  Wound Description (Comments): purple  Present on Admission: Yes    Physical Exam: Vital Signs Blood pressure 120/82, pulse 83, temperature 98 F (36.7 C), resp. rate 16, weight 68.7 kg, SpO2 95%. Constitutional: No apparent distress. Appropriate appearance for age.  HENT: No JVD. Neck Supple. Trachea midline. Atraumatic, normocephalic. Eyes: PERRLA. EOMI. Visual fields grossly intact.   Cardiovascular: RRR, no murmurs/rub/gallops. No Edema. Peripheral pulses 2+  Respiratory: CTAB. No rales, rhonchi, or wheezing. On RA.  Abdomen: + bowel sounds, normoactive. No distention or tenderness.  GU: Not examined.  Skin: C/D/I. No apparent lesions. MSK:      No apparent deformity.      Strength: Antigravity against resistance bilateral upper and lower extremities, 4 out of 5 proximally and 5- out of 5 distally.  Subtle weakness in left upper extremity compared to right upper extremity.  Neurologic exam:  Cognition: AAO to person, place, time and event.  Language: Fluent, No substitutions or neoglisms. No dysarthria.  Memory: No apparent deficits  Insight: Good  insight into current condition.  Mood: Pleasant affect, appropriate mood.  Sensation: Equal and intact in BL UE and Les.  Reflexes: 2+ in BL UE and LEs. Negative Hoffman's and babinski signs bilaterally.  + 1-2 beats of clonus in left foot CN: 2-12 grossly intact.  Spasticity: MAS 0 in all extremities.         Assessment/Plan: 1. Functional deficits which require 3+ hours per day of interdisciplinary therapy in a comprehensive inpatient rehab setting. Physiatrist is providing close team supervision and 24 hour management of active medical problems listed below. Physiatrist and rehab team continue to  assess barriers to discharge/monitor patient progress toward functional and medical goals  Care Tool:  Bathing    Body parts bathed by patient: Right arm, Left arm, Chest, Abdomen, Right upper leg, Left upper leg, Face   Body parts bathed by helper: Left lower leg, Right lower leg, Buttocks, Front perineal area     Bathing assist Assist Level: Moderate Assistance - Patient 50 - 74%     Upper Body Dressing/Undressing Upper body dressing   What is the patient wearing?: Pull over shirt    Upper body assist Assist Level: Minimal Assistance - Patient > 75%    Lower Body Dressing/Undressing Lower body  dressing      What is the patient wearing?: Underwear/pull up     Lower body assist Assist for lower body dressing: Total Assistance - Patient < 25%     Toileting Toileting    Toileting assist Assist for toileting: Total Assistance - Patient < 25%     Transfers Chair/bed transfer  Transfers assist  Chair/bed transfer activity did not occur: Safety/medical concerns  Chair/bed transfer assist level: Maximal Assistance - Patient 25 - 49%     Locomotion Ambulation   Ambulation assist              Walk 10 feet activity   Assist           Walk 50 feet activity   Assist           Walk 150 feet activity   Assist           Walk 10 feet on uneven surface  activity   Assist           Wheelchair     Assist               Wheelchair 50 feet with 2 turns activity    Assist            Wheelchair 150 feet activity     Assist          Blood pressure 120/82, pulse 83, temperature 98 F (36.7 C), resp. rate 16, weight 68.7 kg, SpO2 95%.   Medical Problem List and Plan: 1. Functional deficits secondary to debility after cardiac arrest and multiple related complications             -patient may shower             -ELOS/Goals: 12-14 days, min assist with PT, OT, SLP             -pt has episodes of fatigue/somnolence at baseline per husband. He says she does better in the later morning and early afternoon.  2.  Antithrombotics: -DVT/anticoagulation:  Mechanical:  Antiembolism stockings, knee (TED hose) Bilateral lower extremities (age indeterminate left posterior tib DVT). 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             -antiplatelet therapy: none   3. Pain Management: Tylenol, Norco, oxycodone as needed -Lidoderm patch             -Oxycontin 10 mg q 12 hours             -observe for further sedation.  Was somnolent this morning after receiving pain medication.   -10-1: Simplify pain regimen, DC oxycodone, Norco 7.5 mg 1-2 tabs for moderate to severe pain, maintain OxyContin for now and wean  as able  4. Mood/Behavior/Sleep: LCSW to evaluate and provide emotional support             -antipsychotic agents: Zyprexa 2.5 mg q HS             -Xanax prn   5. Neuropsych/cognition: This patient is capable of making decisions on his own behalf.   6. Skin/Wound Care: Routine skin care checks   -10-1: Family inquiring about protein supplementation, will look into supplements given on inpatient.   7. Fluids/Electrolytes/Nutrition: Routine Is and Os and follow-up chemistries   -10-1: See below, potassium borderline, magnesium repleted today.  Rechecking in AM.  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  Magnesium 1.6, replete 2 g IV today and then start 400 mg twice daily due to recurrent drops.  Recheck in AM.             -if patient needs diuretics outpatient due to lower extremity edema then cardiology will consider changing to a potassium sparing diuretic such as spironolactone 25 mg daily along with some potassium supplementation and magnesium             -follow-up with Dr. 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 fluids and repeat in a.m.   17: Anemia: ~3 gram drop in hemoglobin/no overt bleeding             -follow-up CBC. 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 Venetia Maxon. 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 Has chronic intermittent dizziness>>meclizine prn   20: External hemorrhoids: continue topical hydrocortisone 2.5%   21: Intermittent constipation: check for impaction then decide on appropriate regimen  10-1: Multiple small bowel movements overnight, add MiraLAX 17 mg daily      LOS: 1 days A FACE TO FACE EVALUATION WAS PERFORMED  Virginia [name] 06/11/2023, 1:24 PM

## 2023-06-11 NOTE — Progress Notes (Signed)
[facility] Individual Statement of Services  Patient Name:  Virginia [name]  Date:  06/11/2023  Welcome to the [facility].  Our goal is to provide you with an individualized program based on your diagnosis and situation, designed to meet your specific needs.  With this comprehensive rehabilitation program, you will be expected to participate in at least 3 hours of rehabilitation therapies Monday-Friday, with modified therapy programming on the weekends.  Your rehabilitation program will include the following services:  Physical Therapy (PT), Occupational Therapy (OT), Speech Therapy (ST), 24 hour per day rehabilitation nursing, Therapeutic Recreaction (TR), Neuropsychology, Care Coordinator, Rehabilitation Medicine, Nutrition Services, and Pharmacy Services  Weekly team conferences will be held on Tuesday to discuss your progress.  Your Inpatient Rehabilitation Care Coordinator will talk with you frequently to get your input and to update you on team discussions.  Team conferences with you and your family in attendance may also be held.  Expected length of stay: 3 weeks  Overall anticipated outcome: CGA assist  Depending on your progress and recovery, your program may change. Your Inpatient Rehabilitation Care Coordinator will coordinate services and will keep you informed of any changes. Your Inpatient Rehabilitation Care Coordinator's name and contact numbers are listed  below.  The following services may also be recommended but are not provided by the [facility]:   Home Health Rehabiltiation Services Outpatient Rehabilitation Services    Arrangements will be made to provide these services after discharge if needed.  Arrangements include referral to agencies that provide these services.  Your insurance has been verified to be:  medicare & [employer] Your primary doctor is:  Virginia [name]  Pertinent information will be  shared with your doctor and your insurance company.  Inpatient Rehabilitation Care Coordinator:  Susie Cassette [phone] or [phone]  Information discussed with and copy given to patient by: Lucy Chris, 06/11/2023, 10:36 AM

## 2023-06-11 NOTE — Evaluation (Signed)
Speech Language Pathology Assessment and Plan  Patient Details  Name: Virginia [name] MRN: [id number] Date of Birth: [birth year]  SLP Diagnosis: Cognitive Impairments  Rehab Potential: Good ELOS: 3 weeks   Today's Date: 06/11/2023 SLP Individual Time: 1100-1156 SLP Individual Time Calculation (min): 56 min  Hospital Problem: Principal Problem:   Cardiac arrest [facility]) Active Problems:   Lung cancer metastatic to brain (HCC)   Debility   History of DVT (deep vein thrombosis)   Urine retention   Acute blood loss anemia   History of ventricular fibrillation  Past Medical History:  Past Medical History:  Diagnosis Date   Anemia    Anxiety    Concussion 09/28/2019   DVT (deep venous thrombosis) (HCC) 2021   L leg   Dyspnea    GERD (gastroesophageal reflux disease)    Hypercholesterolemia    per pt, she does not have elevated lipids   Hypertension    met lung ca dx'd 09/2019   mets to spine, hip and brain   PONV (postoperative nausea and vomiting)    Tobacco abuse    Past Surgical History:  Past Surgical History:  Procedure Laterality Date   ABDOMINAL HYSTERECTOMY     partial/ left ovaries   APPLICATION OF CRANIAL NAVIGATION N/A 12/02/2020   Procedure: APPLICATION OF CRANIAL NAVIGATION;  Surgeon: Maeola Harman, MD;  Location: [facility] OR;  Service: Neurosurgery;  Laterality: N/A;   CHOLECYSTECTOMY     CRANIOTOMY N/A 12/02/2020   Procedure: Posterior fossa craniotomy for tumor resection with brainlab;  Surgeon: Maeola Harman, MD;  Location: [facility] OR;  Service: Neurosurgery;  Laterality: N/A;   DILATION AND CURETTAGE OF UTERUS     IR IMAGING GUIDED PORT INSERTION  10/23/2019   IR PATIENT EVAL TECH 0-60 MINS  11/21/2021   IR PATIENT EVAL TECH 0-60 MINS  11/24/2021   IR PATIENT EVAL TECH 0-60 MINS  11/29/2021   IR PATIENT EVAL TECH 0-60 MINS  12/04/2021   IR PATIENT EVAL TECH 0-60 MINS  12/12/2021   IR PATIENT EVAL TECH 0-60 MINS  12/19/2021   IR PATIENT EVAL TECH 0-60 MINS  12/27/2021    IR PATIENT EVAL TECH 0-60 MINS  01/03/2022   IR PATIENT EVAL TECH 0-60 MINS  01/10/2022   IR PATIENT EVAL TECH 0-60 MINS  01/18/2022   IR PATIENT EVAL TECH 0-60 MINS  02/13/2022   IR PATIENT EVAL TECH 0-60 MINS  02/20/2022   IR RADIOLOGIST EVAL & MGMT  01/23/2022   IR REMOVAL TUN ACCESS W/ PORT W/O FL MOD SED  11/17/2021   KYPHOPLASTY N/A 03/15/2020   Procedure: Thoracic Eight KYPHOPLASTY;  Surgeon: Maeola Harman, MD;  Location: [facility] OR;  Service: Neurosurgery;  Laterality: N/A;  prone    LIPOMA EXCISION  2018   removed under left breast and right thigh.   TUBAL LIGATION     Assessment / Plan / Recommendation Clinical Impression Pt. Is a 67 year old with a history of metastatic lung cancer with lesions of the brain, DVT, HTN, HLD, tobacco abuse, and anxiety disorder who presented to the [facility] ED 9/23 following a witnessed out-of-hospital cardiac arrest. She received 5 shocks via EMS for reported VF prior to her arrival and 2 rounds of epinephrine.  On ED arrival she was actually hemodynamically stable but was emergently intubated for airway protection.  Family reported concern that her arrest may have been preceded by seizure activity.She was initially admitted to the ICU and cared for by PCCM.  She was  able to be rapidly weaned from the ventilator and extubated 06/05/2023 at 08:28. She she appeared to be mentally intact with no focal deficits following extubation. 2D echo demonstrated mild LV dysfunction EF 45 to 50% with no valvular abnormalities. Plan was for medical management given her end-stage lung cancer felt not a candidate for coronary angiography with PCI. Suspect arrhythmias were related to metabolic derangements. VF arrest felt to be related to metabolic derangements with cesium 1.4, potassium 3.4 and calcium 6.76 ionized calcium 1.07. Deemed not to be a candidate for cath/PCI due to history of brain mets with head bleed in the past. Conservative management planned. Pt. Will need  to follow up with her oncologist once discharged from CIR. PT. Seen by PT/OT/SLP and they recommend CIR to assist return to PLOF.   Swallowing: SLP conducted oral mechanism examination noting mild general lingual weakness and weakened cough but otherwise WFL. Across trials of water, applesauce, and regular solids, no overt s/sx of penetration/aspiration observed and no difficulty reported. Patient does endorse singular choking episode with grits this AM resulting in emesis, however patient reports this is an isolated incident and no concerns observed during bedside swallow evaluation. Recommend continuation of regular/thin liquid diet with pills administered with thin liquids. However, given comorbidity of end stage lung cancer, recommend SLP continue to dynamically assess for any developing difficulty.   Cognitive-Linguistic: Patient presents with cognitive deficits in the areas of memory, attention, awareness, problem solving, orientation, and self-monitoring/self-correcting as evidenced by performance of 9/30 on SLUMS examination and reduced recall of information throughout case history with SLP. Husband present and serving as accurate historian of events as well as comparisons to patient's baseline level of function. Per husband and patient report, speech and expressive/receptive language function are at baseline. Patient exhibits left inattention, especially during written functional tasks (e.g. clock drawing). During clock drawing tasks, patient wrote all numbers on the right side of the clock, then became tearful when made aware of misplacement. SLP provided reassurance and patient receptive to excitement about improving via CIR therapies. Patient would benefit from SLP services during CIR admission to target aforementioned deficits.    Skilled Therapeutic Interventions          SLP conducted skilled evaluation session to assess swallowing and cognitive function via bedside swallow evaluation and SLUMS  examination. Results above.    SLP Assessment  Patient will need skilled Speech Lanaguage Pathology Services during CIR admission    Recommendations  SLP Diet Recommendations: Age appropriate regular solids;Thin Liquid Administration via: Cup;Straw Medication Administration: Whole meds with liquid Supervision: Intermittent supervision to cue for compensatory strategies Compensations: Slow rate;Small sips/bites Postural Changes and/or Swallow Maneuvers: Seated upright 90 degrees Oral Care Recommendations: Oral care BID Recommendations for Other Services: Neuropsych consult Patient destination: Home Follow up Recommendations: Home Health SLP Equipment Recommended: None recommended by SLP    SLP Frequency 3 to 5 out of 7 days   SLP Duration  SLP Intensity  SLP Treatment/Interventions 3 weeks  Minumum of 1-2 x/day, 30 to 90 minutes  Cognitive remediation/compensation;Environmental controls;Cueing hierarchy;Functional tasks;Therapeutic Activities;Internal/external aids;Patient/family education;Medication managment    Pain Pain Assessment Pain Score: 6  Pain Location: Back  Prior Functioning Cognitive/Linguistic Baseline: Within functional limits Type of Home: House  Lives With: Spouse Available Help at Discharge: Family;Available 24 hours/day Education: 9th grade 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)  SLP Evaluation Cognition Overall Cognitive Status: Impaired/Different from baseline Arousal/Alertness: Awake/alert Orientation Level: Oriented to person;Oriented to place;Oriented to situation;Disoriented  to time Year: 2024 Month: October Day of Week: Incorrect Attention: Sustained Sustained Attention: Impaired Sustained Attention Impairment: Functional basic Memory: Impaired Memory Impairment: Storage deficit;Retrieval deficit;Decreased recall of new information Awareness: Impaired Awareness Impairment: Intellectual  impairment;Emergent impairment;Anticipatory impairment Problem Solving: Impaired Problem Solving Impairment: Verbal basic;Functional basic Executive Function: Sequencing;Organizing;Self Monitoring;Self Correcting Sequencing: Impaired Sequencing Impairment: Verbal basic Organizing: Impaired Organizing Impairment: Verbal basic Self Monitoring: Impaired Self Monitoring Impairment: Functional basic Self Correcting: Impaired Self Correcting Impairment: Functional basic Safety/Judgment: Appears intact Comments: n/a  Comprehension Auditory Comprehension Overall Auditory Comprehension: Appears within functional limits for tasks assessed Expression Expression Primary Mode of Expression: Verbal Verbal Expression Overall Verbal Expression: Appears within functional limits for tasks assessed Oral Motor Oral Motor/Sensory Function Overall Oral Motor/Sensory Function: Mild impairment Facial ROM: Within Functional Limits Facial Symmetry: Within Functional Limits Facial Strength: Within Functional Limits Facial Sensation: Within Functional Limits Lingual ROM: Within Functional Limits Lingual Symmetry: Within Functional Limits Lingual Strength: Reduced Lingual Sensation: Within Functional Limits Velum: Within Functional Limits Mandible: Within Functional Limits Motor Speech Overall Motor Speech: Appears within functional limits for tasks assessed Respiration: Within functional limits Phonation: Normal Resonance: Within functional limits Articulation: Within functional limitis (mildly imprecise, husband reports is baseline) Intelligibility: Intelligible Motor Planning: Witnin functional limits Motor Speech Errors: Not applicable  Care Tool Care Tool Cognition Ability to hear (with hearing aid or hearing appliances if normally used Ability to hear (with hearing aid or hearing appliances if normally used): 0. Adequate - no difficulty in normal conservation, social interaction, listening to  TV   Expression of Ideas and Wants Expression of Ideas and Wants: 4. Without difficulty (complex and basic) - expresses complex messages without difficulty and with speech that is clear and easy to understand   Understanding Verbal and Non-Verbal Content Understanding Verbal and Non-Verbal Content: 3. Usually understands - understands most conversations, but misses some part/intent of message. Requires cues at times to understand  Memory/Recall Ability Memory/Recall Ability : Current season;That he or she is in a hospital/hospital unit   Intelligibility: Intelligible  Bedside Swallowing Assessment General Date of Onset: 06/11/23 Previous Swallow Assessment: 06/05/23 Diet Prior to this Study: Regular;Thin liquids (Level 0) Temperature Spikes Noted: No Respiratory Status: Room air History of Recent Intubation: Yes Total duration of intubation (days): 3 days Date extubated: 06/05/23 Behavior/Cognition: Alert;Cooperative;Pleasant mood Oral Cavity - Dentition: Adequate natural dentition Self-Feeding Abilities: Able to feed self Vision: Functional for self-feeding Patient Positioning: Upright in bed Baseline Vocal Quality: Normal Volitional Cough: Weak Volitional Swallow: Able to elicit  Oral Care Assessment Oral Assessment  (WDL): Within Defined Limits Lips: Symmetrical Teeth: Intact Tongue: Pink;Moist Mucous Membrane(s): Pink;Moist Saliva: Moist, saliva free flowing Level of Consciousness: Alert Is patient on any of following O2 devices?: None of the above Nutritional status: No high risk factors Oral Assessment Risk : Low Risk Ice Chips Ice chips: Not tested Thin Liquid Thin Liquid: Within functional limits Presentation: Cup;Straw Nectar Thick Nectar Thick Liquid: Not tested Honey Thick Honey Thick Liquid: Not tested Puree Puree: Within functional limits Presentation: Self Fed;Spoon Solid Solid: Within functional limits Presentation: Self Fed BSE Assessment Suspected  Esophageal Findings Suspected Esophageal Findings: Globus sensation (episode of emesis after globus sensation with grits reported by patient this AM, though episode not observed by SLP) Risk for Aspiration Impact on safety and function: Mild aspiration risk Other Related Risk Factors: Deconditioning;Cognitive impairment;Decreased respiratory status  Short Term Goals: Week 1: SLP Short Term Goal 1 (Week 1): Patient will solve basic environmental problems in 4/5  opportunities given modA. SLP Short Term Goal 2 (Week 1): Patient will attend to the left during functional vision-based tasks (i.e. reading, drawing, etc.) given modA. SLP Short Term Goal 3 (Week 1): Patient will utilize external memory and orientation aids to recall time, day of the week, and basic daily information given modA. SLP Short Term Goal 4 (Week 1): Patient will demonstrate emergent awareness of problems during basic functional tasks in 4/5 opportunities given maxA.  Refer to Care Plan for Long Term Goals  Recommendations for other services: Neuropsych  Discharge Criteria: Patient will be discharged from SLP if patient refuses treatment 3 consecutive times without medical reason, if treatment goals not met, if there is a change in medical status, if patient makes no progress towards goals or if patient is discharged from hospital.  The above assessment, treatment plan, treatment alternatives and goals were discussed and mutually agreed upon: by patient  Jeannie Done, M.A., CCC-SLP  Yetta Barre 06/11/2023, 12:46 PM

## 2023-06-11 NOTE — Progress Notes (Signed)
Inpatient Rehabilitation Admission Medication Review by a Pharmacist   A complete drug regimen review was completed for this patient to identify any potential clinically significant medication issues.   High Risk Drug Classes Is patient taking? Indication by Medication  Antipsychotic Yes Zyprexa- CINV  Anticoagulant No    Antibiotic No    Opioid Yes Oxycontin, Norco, OxyIR- cancer related pain  Antiplatelet No    Hypoglycemics/insulin Yes  insulin- T2DM  Vasoactive Medication Yes Amiodarone- antiarrhythmic  Chemotherapy No    Other Yes protonix- GERD Keppra- seizure ppx Xanax- anxiety        Type of Medication Issue Identified Description of Issue Recommendation(s)  Drug Interaction(s) (clinically significant)        Duplicate Therapy        Allergy        No Medication Administration End Date        Incorrect Dose        Additional Drug Therapy Needed        Significant med changes from prior encounter (inform family/care partners about these prior to discharge).      Other   PTA meds: Norvasc Lasix Xtampza Restart PTA meds when and if necessary during CIR admission or at time of discharge, if warranted      Clinically significant medication issues were identified that warrant physician communication and completion of prescribed/recommended actions by midnight of the next day:  No     Time spent performing this drug regimen review (minutes):  30     Jasmane Brockway BS, PharmD, BCPS Clinical Pharmacist 06/11/2023 07:10 AM   Contact: [phone] after 3 PM   "Be curious, not judgmental..." -Debbora Dus

## 2023-06-12 DIAGNOSIS — I469 Cardiac arrest, cause unspecified: Secondary | ICD-10-CM | POA: Diagnosis not present

## 2023-06-12 LAB — BASIC METABOLIC PANEL
Anion gap: 11 (ref 5–15)
BUN: 23 mg/dL (ref 8–23)
CO2: 27 mmol/L (ref 22–32)
Calcium: 8.3 mg/dL — ABNORMAL LOW (ref 8.9–10.3)
Chloride: 102 mmol/L (ref 98–111)
Creatinine, Ser: 1.16 mg/dL — ABNORMAL HIGH (ref 0.44–1.00)
GFR, Estimated: 52 mL/min — ABNORMAL LOW (ref >60)
Glucose, Bld: 113 mg/dL — ABNORMAL HIGH (ref 70–99)
Potassium: 4 mmol/L (ref 3.5–5.1)
Sodium: 140 mmol/L (ref 135–145)

## 2023-06-12 LAB — GLUCOSE, CAPILLARY
Glucose-Capillary: 109 mg/dL — ABNORMAL HIGH (ref 70–99)
Glucose-Capillary: 115 mg/dL — ABNORMAL HIGH (ref 70–99)
Glucose-Capillary: 118 mg/dL — ABNORMAL HIGH (ref 70–99)
Glucose-Capillary: 151 mg/dL — ABNORMAL HIGH (ref 70–99)
Glucose-Capillary: 202 mg/dL — ABNORMAL HIGH (ref 70–99)

## 2023-06-12 LAB — PHOSPHORUS: Phosphorus: 4 mg/dL (ref 2.5–4.6)

## 2023-06-12 LAB — MAGNESIUM: Magnesium: 1.9 mg/dL (ref 1.7–2.4)

## 2023-06-12 MED ORDER — ADULT MULTIVITAMIN W/MINERALS CH
1.0000 | ORAL_TABLET | Freq: Every day | ORAL | Status: DC
  Administered 2023-06-13 – 2023-07-02 (×20): 1 via ORAL
  Filled 2023-06-12 (×20): qty 1

## 2023-06-12 NOTE — Progress Notes (Signed)
Occupational Therapy Session Note  Patient Details  Name: HOLLYANNE SCHLOESSER MRN: [id number] Date of Birth: 1956/01/26  Today's Date: 06/12/2023 OT Individual Time: 1445-1530 OT Individual Time Calculation (min): 45 min    Short Term Goals: Week 1:  OT Short Term Goal 1 (Week 1): Patient will complete toilet transfer with mod A of 1 OT Short Term Goal 2 (Week 1): Patient will maintain sitting balance at EOB for 5 minutes in preparation for BADL task OT Short Term Goal 3 (Week 1): Patient will perform 1 step of LB dressing task  Skilled Therapeutic Interventions/Progress Updates:     Pt received semi-reclined in bed with husband present in room. Pt presenting to be in good spirits receptive to skilled OT session reporting back pain without numerical value given- OT offering intermittent rest breaks, repositioning, and therapeutic support to optimize participation in therapy session. Pt reporting fatigue and dizziness, however motivated to participate in OT session. Pt transitioned to EOB with HOB elevated using bed features supervision. Pt dressed and ready for the day upon OT arrival- requesting to use restroom during OT session. Attempted to engage Pt in completing functional mobility to BR, however pt reporting dizziness in standing stating "I am too dizzy too walk right now". Provided therapeutic support and encouragement and increased time sitting EOB. Utilized stedy for transport to BR with education provided on hand placement and technique for using stedy d/t this being Pt's first experience. Pt complete sit>stand suing B UE supported on stedy grab bar light min A and maintained perched sitting position with close supervision. Pt able to maintain standing balance with single UE support while bringing pants from waist. Increased time provided on toilet d/t need for BM-continent B&B with nursing staff informed. Pt performed anterior peri-care seated and received assistance for posterior peri-care  while standing using grab bar. Pt attempted to bring pants to waist, however reported dizziness and request assistance to fully pull up pants. Pt maintained perches sitting position while washing hands at sink. EOB>Supine with HOB elevated using bed features supervision. Pt was left resting in bed with call bell in reach, bed alarm on, and all needs met.    Therapy Documentation Precautions:  Precautions Precautions: Fall Restrictions Weight Bearing Restrictions: No   Therapy/Group: Individual Therapy  Clide Deutscher 06/12/2023, 3:33 PM

## 2023-06-12 NOTE — Progress Notes (Signed)
Physical Therapy Session Note  Patient Details  Name: Virginia [name] MRN: [id number] Date of Birth: 10-20-1955  Today's Date: 06/12/2023 PT Individual Time: 0930-1000 and 1115-1200 PT Individual Time Calculation (min): 30 min and 45 min.  Short Term Goals: Week 1:  PT Short Term Goal 1 (Week 1): Pt will ambulate 25' with RW min assist PT Short Term Goal 2 (Week 1): Pt will complete up/down 1 step with BUE support mod assist PT Short Term Goal 3 (Week 1): Pt will complete transfers with min assist and LRAD PT Short Term Goal 4 (Week 1): Pt will complete bed mobility with min assist  Skilled Therapeutic Interventions/Progress Updates:   First session: Pt presents sitting almost upright in bed and agreeable to therapy, c/o fatigue.  Bed lowered to ~45 degrees and performed log roll technique w/ mod A to EOB.  Pt transfers sit to stand w/ mod A and cues for sequencing.  Pt amb x 5' to w/c w/ mod A and RW, verbal cues for foot cleaance and upright posture.  Pt remained sitting in w/c and handed off to ST.  Chair alarm on and all needs in reach, spouse present.  Second session:  Pt presents sitting in w/c and agreeable to therapy.  Pt wheeled to dayroom for energy conservation.  Pt performed calf raises, LAQ, hip flexion and isometric add 3 x 10 w/ 2# weights.  Cueing for LLE ROM.  Pt performed sit to stand transfers w/ mod A but max cues for scoot forward and split hand placement for transfers.  Pt performed marching w/ cues for readying LLE for weight acceptance, noted slight wobbliness to L knee.  Pt performed 3 x 10 and then cues for backing to w/c and reaching down for controlled descent.  Pt returned to room and remained sitting w/ chair alarm on and all needs in reach, spouse present.      Therapy Documentation Precautions:  Precautions Precautions: Fall Restrictions Weight Bearing Restrictions: No General:   Vital Signs:   Pain:7/10 AM and PM      Therapy/Group: Individual  Therapy  Lucio Edward 06/12/2023, 10:03 AM

## 2023-06-12 NOTE — Progress Notes (Signed)
Speech Language Pathology Daily Session Note  Patient Details  Name: Virginia [name] MRN: [id number] Date of Birth: 10-01-1955  Today's Date: 06/12/2023 SLP Individual Time: 1000-1046 SLP Individual Time Calculation (min): 46 min  Short Term Goals: Week 1: SLP Short Term Goal 1 (Week 1): Patient will solve basic environmental problems in 4/5 opportunities given modA. SLP Short Term Goal 2 (Week 1): Patient will attend to the left during functional vision-based tasks (i.e. reading, drawing, etc.) given modA. SLP Short Term Goal 3 (Week 1): Patient will utilize external memory and orientation aids to recall time, day of the week, and basic daily information given modA. SLP Short Term Goal 4 (Week 1): Patient will demonstrate emergent awareness of problems during basic functional tasks in 4/5 opportunities given maxA.  Skilled Therapeutic Interventions:  Pt seen for skilled SLP session to address cognitive goals. Pt completed visual memory task with ~50% accuracy independently; she was able to improve accuracy to ~70% given multiple choice options. Pt identified ~75% safety hazards in pictured scene independently. She required min-mod directional cues to identify remaining hazards and moderate cues to provide information on why the object was hazard and how to resolve the problem. 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   SLP facilitated brief discussion re: swallowing concerns discussed in prior session and discussed option for instrumental swallow study if needed. Pt and husband reported pt does well with PO intake and swallowing pills if she is sitting fully upright and eats at a slow pace.  They do not feel that further dysphagia intervention or work up is needed at this time and expressed good understanding of education.   Pt  left sitting upright in w/c with brakes locked and chair alarm activated. Husband at bedside. Continue SLP PoC.   Pain Pain Assessment Pain Scale: 0-10 Pain Score: 0-No pain Pain Type: Chronic pain Pain Location: Back Pain Descriptors / Indicators: Aching;Discomfort Pain Frequency: Constant Pain Onset: On-going Pain Intervention(s): Medication (See eMAR) Multiple Pain Sites: No  Therapy/Group: Individual Therapy  Ellery Plunk 06/12/2023, 11:25 AM

## 2023-06-12 NOTE — Discharge Instructions (Addendum)
Inpatient Rehab Discharge Instructions  Virginia [name] Discharge date and time:  07/02/2023  Activities/Precautions/ Functional Status: Activity: no lifting, driving, or strenuous exercise until cleared by MD Diet: cardiac diet and diabetic diet Wound Care: none needed Functional status:  ___ No restrictions     ___ Walk up steps independently ___ 24/7 supervision/assistance   ___ Walk up steps with assistance _x__ Intermittent supervision/assistance  ___ Bathe/dress independently ___ Walk with walker     ___ Bathe/dress with assistance ___ Walk Independently    ___ Shower independently ___ Walk with assistance    __x_ Shower with assistance _x__ No alcohol     ___ Return to work/school ________  Special Instructions: No driving, alcohol consumption or tobacco use.   COMMUNITY REFERRALS UPON DISCHARGE:    Home Health:   PT      OT      ST        SNA                 Agency:[facility]        [phone] *Please expect follow-up within 2-3 days of discharge to schedule your home visit. If you have not received follow-up, be sure to contact the branch directly.*     My questions have been answered and I understand these instructions. I will adhere to these goals and the provided educational materials after my discharge from the hospital.  Patient/Caregiver Signature _______________________________ Date __________  Clinician Signature _______________________________________ Date __________  Please bring this form and your medication list with you to all your follow-up doctor's appointments.

## 2023-06-12 NOTE — Progress Notes (Signed)
PROGRESS NOTE   Subjective/Complaints:  Magnesium improved today to 1.9, potassium back to 4.  Vitals stable overnight, no events.  Ongoing chronic back pain, 8 out of 10, but participating with therapies. No questions or concerns from patient or husband; he is encouraged that patient memory seems to be improving.   ROS: Denies fevers, chills, N/V, abdominal pain, constipation, diarrhea, SOB, cough, chest pain, new weakness or paraesthesias.  Per HPI above.   Objective:   No results found. Recent Labs    06/11/23 0558  WBC 10.2  HGB 7.6*  HCT 22.6*  PLT 312   Recent Labs    06/11/23 0558 06/12/23 0500  NA 139 140  K 3.8 4.0  CL 100 102  CO2 30 27  GLUCOSE 121* 113*  BUN 20 23  CREATININE 1.18* 1.16*  CALCIUM 8.4* 8.3*    Intake/Output Summary (Last 24 hours) at 06/12/2023 0835 Last data filed at 06/11/2023 1600 Gross per 24 hour  Intake 516.48 ml  Output 571 ml  Net -54.52 ml     Pressure Injury 06/03/23 Buttocks Bilateral Deep Tissue Pressure Injury - Purple or maroon localized area of discolored intact skin or blood-filled blister due to damage of underlying soft tissue from pressure and/or shear. purple (Active)  06/03/23 1740  Location: Buttocks  Location Orientation: Bilateral  Staging: Deep Tissue Pressure Injury - Purple or maroon localized area of discolored intact skin or blood-filled blister due to damage of underlying soft tissue from pressure and/or shear.  Wound Description (Comments): purple  Present on Admission: Yes    Physical Exam: Vital Signs Blood pressure 139/83, pulse 81, temperature 97.6 F (36.4 C), temperature source Oral, resp. rate 18, weight 68.7 kg, SpO2 97%.  Constitutional: No apparent distress. Appropriate appearance for age. Laying in bed HENT: No JVD. Neck Supple. Trachea midline. Atraumatic, normocephalic. Eyes: PERRLA.  Cardiovascular: RRR, + systolic murmur. 1+ Edema  in left hand - improving.  Respiratory: CTAB. No rales, rhonchi, or wheezing. On RA.  Abdomen: + bowel sounds, normoactive. No distention or tenderness.  Skin: C/D/I. No apparent lesions. +IV, intact MSK:      No apparent deformity.      Strength: Antigravity against resistance bilateral upper and lower extremities, 4 out of 5 proximally and 5- out of 5 distally.  Subtle weakness in left upper extremity compared to right upper extremity - ongoing  Neurologic exam:  Cognition: AAO to person, place, time and event.  +Mild cognitive, memory deficits Insight: Good  insight into current condition.  Mood: Pleasant affect, appropriate mood.  Sensation: Equal and intact in BL UE and Les.        Assessment/Plan: 1. Functional deficits which require 3+ hours per day of interdisciplinary therapy in a comprehensive inpatient rehab setting. Physiatrist is providing close team supervision and 24 hour management of active medical problems listed below. Physiatrist and rehab team continue to assess barriers to discharge/monitor patient progress toward functional and medical goals  Care Tool:  Bathing    Body parts bathed by patient: Right arm, Left arm, Chest, Abdomen, Right upper leg, Left upper leg, Face   Body parts bathed by helper: Left lower leg, Right lower  leg, Buttocks, Front perineal area     Bathing assist Assist Level: Moderate Assistance - Patient 50 - 74%     Upper Body Dressing/Undressing Upper body dressing   What is the patient wearing?: Pull over shirt    Upper body assist Assist Level: Minimal Assistance - Patient > 75%    Lower Body Dressing/Undressing Lower body dressing      What is the patient wearing?: Underwear/pull up     Lower body assist Assist for lower body dressing: Total Assistance - Patient < 25%     Toileting Toileting    Toileting assist Assist for toileting: Total Assistance - Patient < 25%     Transfers Chair/bed transfer  Transfers  assist  Chair/bed transfer activity did not occur: Safety/medical concerns  Chair/bed transfer assist level: Moderate Assistance - Patient 50 - 74%     Locomotion Ambulation   Ambulation assist      Assist level: 2 helpers Assistive device: Walker-rolling Max distance: 5'   Walk 10 feet activity   Assist  Walk 10 feet activity did not occur: Safety/medical concerns        Walk 50 feet activity   Assist Walk 50 feet with 2 turns activity did not occur: Safety/medical concerns         Walk 150 feet activity   Assist Walk 150 feet activity did not occur: Safety/medical concerns         Walk 10 feet on uneven surface  activity   Assist Walk 10 feet on uneven surfaces activity did not occur: Safety/medical concerns         Wheelchair     Assist Is the patient using a wheelchair?: Yes Type of Wheelchair: Manual    Wheelchair assist level: Dependent - Patient 0%      Wheelchair 50 feet with 2 turns activity    Assist        Assist Level: Dependent - Patient 0%   Wheelchair 150 feet activity     Assist      Assist Level: Dependent - Patient 0%   Blood pressure 139/83, pulse 81, temperature 97.6 F (36.4 C), temperature source Oral, resp. rate 18, weight 68.7 kg, SpO2 97%.   Medical Problem List and Plan: 1. Functional deficits secondary to debility after cardiac arrest and multiple related complications             -patient may shower             -ELOS/Goals: 12-14 days, min assist with PT, OT, SLP             -pt has episodes of fatigue/somnolence at baseline per husband. He says she does better in the later morning and early afternoon.   -stable to continue CIR  2.  Antithrombotics: -DVT/anticoagulation:  Mechanical:  Antiembolism stockings, knee (TED hose) Bilateral lower extremities (age indeterminate left posterior tib DVT). No anticoagulation given that this is a known DVT from 3/21 and that she's had a cerebral  hemorrhage while on eliquis for that same clot the same year.             -antiplatelet therapy: none   3. Pain Management: Tylenol, Norco, oxycodone as needed -Lidoderm patch             -Oxycontin 10 mg q 12 hours             -observe for further sedation. Was somnolent this morning after receiving pain medication.   -10-1: Simplify pain regimen,  DC oxycodone, Norco 7.5 mg 1-2 tabs for moderate to severe pain, maintain OxyContin for now and wean as able - tolerating,will discuss reduction Oxycontin 10/3  4. Mood/Behavior/Sleep: LCSW to evaluate and provide emotional support             -antipsychotic agents: Zyprexa 2.5 mg q HS             -Xanax prn   5. Neuropsych/cognition: This patient is capable of making decisions on his own behalf.   6. Skin/Wound Care: Routine skin care checks   -10-1: Family inquiring about protein supplementation, will look into supplements given on inpatient.   7. Fluids/Electrolytes/Nutrition: Routine Is and Os and follow-up chemistries   -10-1: See below, potassium borderline, magnesium repleted today.  Rechecking in AM.   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  Magnesium 1.6, replete 2 g IV today and then start 400 mg twice daily due to recurrent drops.  Recheck in AM.             -if patient needs diuretics outpatient due to lower  extremity edema then cardiology will consider changing to a potassium sparing diuretic such as spironolactone 25 mg daily along with some potassium supplementation and magnesium             -follow-up with Dr. 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 fluids and repeat in a.m.   17: Anemia: ~3 gram drop in hemoglobin/no overt bleeding             -follow-up CBC. Most recently has been ~7.7; stable on admission labs             -  check Hemoccult X 3 -pending             -has hemorrhoids, constipated             -continue PPI, Pepcid   18: DM: A1c = 7.3%, also on Decadron             -start CBGs QID and SSI             -carb modified diet as tolerated  Blood sugars well-controlled, monitor Recent Labs    06/11/23 2055 06/12/23 0002 06/12/23 0616  GLUCAP 151* 118* 109*       19: Prior post fossa craniotomy for tumor resection 12/02/2020: resection of progressive cerebellar mass by Dr. Venetia Maxon. 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 Has chronic intermittent dizziness>>meclizine prn   - well controlled, no current complaints   20: External hemorrhoids: continue topical hydrocortisone 2.5%   21: Intermittent constipation: check for impaction then decide on appropriate regimen  10-1: Multiple small  bowel movements overnight, add MiraLAX 17 mg daily  10-2: Small brown bowel movement this a.m., mushy consistency - improved      LOS: 2 days A FACE TO FACE EVALUATION WAS PERFORMED  Virginia [name] 06/12/2023, 8:35 AM

## 2023-06-12 NOTE — Progress Notes (Signed)
Initial Nutrition Assessment  DOCUMENTATION CODES:   Not applicable  INTERVENTION:  Continue Ensure Enlive po BID, each supplement provides 350 kcal and 20 grams of protein.  Continue PROSource Plus po BID, each supplement provides 100 kcal and 15 grams of protein.  Start multivitamin with minerals po once daily.  Ordered repeat 25-OH vitamin D level due to concern for previous vitamin D deficiency in consult to RD and no recent level. If vitamin D <30 ng/mL, recommend starting vitamin D3 2000 international units daily x 6 weeks and then re-checking level in outpatient setting.  RD provided "High Calorie, High Protein Nutrition Therapy" handout from the Academy of [employer] and reviewed with patient and husband. Encouraged intake of small, frequent meals. Also encouraged intake of adequate calories and protein at meals/snacks. Reviewed foods that are good sources of protein.  NUTRITION DIAGNOSIS:   Increased nutrient needs related to catabolic illness (metastatic lung cancer, DTI) as evidenced by estimated needs.  GOAL:   Patient will meet greater than or equal to 90% of their needs  MONITOR:   PO intake, Supplement acceptance, Labs, Weight trends, Skin, I & O's  REASON FOR ASSESSMENT:   Consult Assessment of nutrition requirement/status, Diet education (Stage IV lung Ca with brain and bone mets; s/p cardiac arrest due to metabolic deregulation with DTI; hypomagnesium, hypokalemia, vit D deficiency and low albumin)  ASSESSMENT:   67 year old female with PMHx of metastatic lung cancer with lesions of the brain (currently on Pem/Pem maintenance), s/p craniotomy and resection of progressive cerebellar mass on 12/02/20, DVT, HTN, HLD, tobacco abuse, anxiety admitted following a witnessed out-of-hospital V-fib cardiac arrest intubated on presentation 9/23 and extubated on 9/25, also with postarrest acute lung injury, possible seizure, multiple bilateral rib fractures due  to CPR. Admitted to [facility] on 06/10/23 due to functional deficits secondary to out-of-hospital cardiac arrest.  Met with pt and her husband at bedside. Pt reports her appetite has been good. At home she typically eats 2 meals per day. For breakfast she has waffle or cereal or oatmeal. For dinner she has [employer] or cheeseburger or [place] [employer] or [place]. She typically has one snack daily of fruit. She has been working on increasing her protein intake at home. Denies food allergies or intolerances. Denies nausea, emesis, abdominal pain, or difficulty with chewing/swallowing. Pt was ordered for Ensure Enlive BID and PROSource Plus BID. She reports she is willing to continue these supplements to help meet calorie/protein needs. Discussed importance of adequate intake to promote wound healing. Pt endorses she has a history of vitamin D deficiency. She takes vitamin D3 capsules at home but isn't quite sure of the dose she takes. She reports it may be 1000 international units daily. She has not received supplementation since admitted and last level was from 2019. Pt reports she previously was active but since her craniotomy in 2022 she has been less active. She currently can only ambulate with assistance and is mostly chair or bed-bound.  Pt reports her recent UBW has been 145-150 lbs and that she has been weight-stable. Current wt 68.7 kg (151.46 lbs). Per review of chart pt's weight was previously closer to 170 lbs in 2022, though there was mention of fluid retention previously. In 06/2022 pt's weight was 142.9 lbs.  Medications reviewed and include: PROSource Plus BID, amiodarone, Decadron, Ensure Enlive BID, Novolog 0-9 units TID, Keppra, magnesium oxide 400 mg BID, pantoprazole, Miralax 17 grams daily  Labs reviewed: CBG 109-159, Creatinine 1.16  HgbA1c 7/3 H 06/03/23 Consult to RD included concern for vitamin D deficiency. Last 25-OH vitamin D level was 28 L on 11/15/2017. No recent level  since then.  UOP: 571 mL or 0.3 mL/kg/hr UOP + 2 occurrences unmeasured UOP in previous 24 hrs  I/O: -1854.5 mL since admission  NUTRITION - FOCUSED PHYSICAL EXAM:  Flowsheet Row Most Recent Value  Orbital Region No depletion  Upper Arm Region Mild depletion  Thoracic and Lumbar Region No depletion  Buccal Region No depletion  Temple Region No depletion  Clavicle Bone Region Mild depletion  Clavicle and Acromion Bone Region Mild depletion  Scapular Bone Region Mild depletion  Dorsal Hand Moderate depletion  Patellar Region Moderate depletion  Anterior Thigh Region Moderate depletion  Posterior Calf Region Moderate depletion  Edema (RD Assessment) Mild  Hair Reviewed  Eyes Reviewed  Mouth Reviewed  Skin Reviewed  Nails Reviewed       Diet Order:   Diet Order             Diet Carb Modified Fluid consistency: Thin; Room service appropriate? Yes  Diet effective now                  EDUCATION NEEDS:   Education needs have been addressed  Skin:  Skin Assessment: Skin Integrity Issues: Skin Integrity Issues:: DTI DTI: bilateral buttocks  Last BM:  06/11/23 - small type 6  Height:   Ht Readings from Last 1 Encounters:  06/03/23 5\' 2"  (1.575 m)   Weight:   Wt Readings from Last 1 Encounters:  06/10/23 68.7 kg   BMI:  Body mass index is 27.7 kg/m.  Estimated Nutritional Needs:   Kcal:  1800-2000  Protein:  90-100 grams  Fluid:  1.8-2 L/day  Letta Median, MS, RD, LDN, CNSC Pager number available on Amion

## 2023-06-12 NOTE — Progress Notes (Signed)
Physical Therapy Session Note  Patient Details  Name: Virginia [name] MRN: [id number] Date of Birth: October 11, 1955  Today's Date: 06/12/2023 PT Individual Time: 4098-1191 PT Individual Time Calculation (min): 43 min   Short Term Goals: Week 1:  PT Short Term Goal 1 (Week 1): Pt will ambulate 25' with RW min assist PT Short Term Goal 2 (Week 1): Pt will complete up/down 1 step with BUE support mod assist PT Short Term Goal 3 (Week 1): Pt will complete transfers with min assist and LRAD PT Short Term Goal 4 (Week 1): Pt will complete bed mobility with min assist  Skilled Therapeutic Interventions/Progress Updates:    Pt received sitting in w/c and reporting fatigue from earlier therapy sessions, but despite this is agreeable to this therapy session.  Transported to/from gym in w/c for time management and energy conservation.   Sit>stands w/c>RW with light mod progressing to min A during session with pt demonstrating good activation through L LE and active movement in L UE to grasp onto AD.   Gait training 48ft x2 using RW with min assist of 1 for balance and security with +2 w/c follow due to pt with increased anxiety. Pt demonstrating the following gait deviations with therapist providing the described cuing and facilitation for improvement:  - reciprocal stepping pattern although decreased step lengths  - slight L knee instability but no knee buckling, and this improves with repetition - decreased gait speed but no severe gait deviations  - pt reports some potential anxiety related dizziness during 1st walk requiring 2x standing rest breaks then therapist utilizing conversations as anxiety distraction during second walk with pt denying any dizziness at that time  Pt reports need to use bathroom so transported back to room.  Pt reluctant to participate in gait training into bathroom initially requesting to utilize [facility]; however, with encouragement is agreeable to perform stand pivot  w/c>toilet using RW and completes with min A. Therapist providing dependent LB clothing management for anxiety management and time management. Pt performed seated pericare with set-up assist, continent of bladder.  Pt's husband, Harvie Heck, arrived and with encouragement/distraction pt agreeable to participate in gait training from toilet to EOB using RW and completed with min A for balance and AD management along with continued conversational distractions for anxiety management to ensure pt safety.   Sit<>stand from EOB with R UE support on bedrail to RW x5 reps with min A for lifting/balance.  Sit>supine using bedrail with CGA and encouragement for independent B LE management. Pt left supine in bed with needs in reach, bed alarm on, and her husband present.  Therapy Documentation Precautions:  Precautions Precautions: Fall Restrictions Weight Bearing Restrictions: No   Pain: No reports of pain throughout session.    Therapy/Group: Individual Therapy  Ginny Forth , PT, DPT, NCS, CSRS 06/12/2023, 12:26 PM

## 2023-06-13 ENCOUNTER — Ambulatory Visit: Payer: Medicare Other | Admitting: Internal Medicine

## 2023-06-13 DIAGNOSIS — I469 Cardiac arrest, cause unspecified: Secondary | ICD-10-CM | POA: Diagnosis not present

## 2023-06-13 LAB — VITAMIN D 25 HYDROXY (VIT D DEFICIENCY, FRACTURES): Vit D, 25-Hydroxy: 52.09 ng/mL (ref 30–100)

## 2023-06-13 LAB — GLUCOSE, CAPILLARY
Glucose-Capillary: 139 mg/dL — ABNORMAL HIGH (ref 70–99)
Glucose-Capillary: 125 mg/dL — ABNORMAL HIGH (ref 70–99)
Glucose-Capillary: 192 mg/dL — ABNORMAL HIGH (ref 70–99)
Glucose-Capillary: 151 mg/dL — ABNORMAL HIGH (ref 70–99)

## 2023-06-13 MED ORDER — SCOPOLAMINE 1 MG/3DAYS TD PT72
1.0000 | MEDICATED_PATCH | TRANSDERMAL | Status: DC
  Filled 2023-06-13: qty 1

## 2023-06-13 MED ORDER — OXYCODONE HCL 5 MG PO TABS
5.0000 mg | ORAL_TABLET | ORAL | Status: DC | PRN
  Administered 2023-06-13 – 2023-07-02 (×57): 5 mg via ORAL
  Filled 2023-06-13 (×61): qty 1

## 2023-06-13 NOTE — Progress Notes (Addendum)
Occupational Therapy Session Note  Patient Details  Name: Virginia [name] MRN: [id number] Date of Birth: 1956/07/01  Today's Date: 06/13/2023 Session 1 OT Individual Time: 4098-1191 OT Individual Time Calculation (min): 57 min   Session 2 OT Individual Time: 4782-9562 OT Individual Time Calculation (min): 30 min    Short Term Goals: Week 1:  OT Short Term Goal 1 (Week 1): Patient will complete toilet transfer with mod A of 1 OT Short Term Goal 2 (Week 1): Patient will maintain sitting balance at EOB for 5 minutes in preparation for BADL task OT Short Term Goal 3 (Week 1): Patient will perform 1 step of LB dressing task  Skilled Therapeutic Interventions/Progress Updates:  Session 1   Pt greeted semi-reclined in bed awake and agreeable to OT treatment session. Pt declined to wash up or change clothes today. Pt  completed bed mobility with increased time, HOB elevated and min A to get L hip around. Pt stood w/ RW, then ambulated 8 feet before having to sit down due to dizziness and fatigue. Pt continued ambulating another 10 feet, then 20 feet w/ extended rest breaks due to dizziness and fatigue. Addressed standing balance/endurance along with reaching along waistline to simulate dressing tasks using clothes pins and basketball net. Pt able to stand with min A and guided A for L hand to reach basketball goal. Pt only able to place two clothes pins before having to stop due to dizziness. Pt pivoted to wc with RW and min A. Pt returned to room and left seated in wc with alarm on, call bell in reach, and spouse present. Pain:  8/10 back pain, rest and repositioned  Session 2 Pt greeted seated in wc and agreeable to OT treatment session. Pt propelled to therapy gym and addressed L hand FMC and visuospatial skills using small peg board activity. Graded task by handing pt pegs as she had increased difficulty with rotation of pegs making activity too frustrating. Pt needed moderate cues to get  the correct placement of pegs in bottom L hand quadrant.   Therapy Documentation Precautions:  Precautions Precautions: Fall Restrictions Weight Bearing Restrictions: No  Pain:  8/10 back pain, rest and repositioned   Therapy/Group: Individual Therapy  Mal Amabile 06/13/2023, 2:40 PM

## 2023-06-13 NOTE — Progress Notes (Signed)
PROGRESS NOTE   Subjective/Complaints:  No events overnight.  Mildly elevated blood glucose overnight and this a.m.  Continent of bowel bladder, postvoid residual 0, last bowel movement 10-2.  Using meclizine for dizziness consistently, is a home medication.  Prescriber Dr. Barbaraann Cao.  Have not tried any alternatives.  Endorses symptoms of vertigo, dizziness, and intermittent nausea.  Agreeable to trying scopolamine patch as adjunctive.    Discussed home medication regimen for pain control, usually uses long-acting oxycodone 13.5 mg twice daily and oxycodone 5 mg 3-4 times daily, every 4 hours as needed.  Agreeable to returning to this regimen for short acting.  ROS: Denies fevers, chills, N/V, abdominal pain, constipation, diarrhea, SOB, cough, chest pain, new weakness or paraesthesias.  Per HPI above.   Objective:   No results found. Recent Labs    06/11/23 0558  WBC 10.2  HGB 7.6*  HCT 22.6*  PLT 312   Recent Labs    06/11/23 0558 06/12/23 0500  NA 139 140  K 3.8 4.0  CL 100 102  CO2 30 27  GLUCOSE 121* 113*  BUN 20 23  CREATININE 1.18* 1.16*  CALCIUM 8.4* 8.3*    Intake/Output Summary (Last 24 hours) at 06/13/2023 1004 Last data filed at 06/12/2023 1846 Gross per 24 hour  Intake 120 ml  Output --  Net 120 ml     Pressure Injury 06/03/23 Buttocks Bilateral Deep Tissue Pressure Injury - Purple or maroon localized area of discolored intact skin or blood-filled blister due to damage of underlying soft tissue from pressure and/or shear. purple (Active)  06/03/23 1740  Location: Buttocks  Location Orientation: Bilateral  Staging: Deep Tissue Pressure Injury - Purple or maroon localized area of discolored intact skin or blood-filled blister due to damage of underlying soft tissue from pressure and/or shear.  Wound Description (Comments): purple  Present on Admission: Yes    Physical Exam: Vital Signs Blood  pressure 125/83, pulse 82, temperature (!) 97.5 F (36.4 C), resp. rate 15, weight 68.7 kg, SpO2 97%.  Constitutional: No apparent distress. Appropriate appearance for age.  Sitting upright in bed, husband at bedside. HENT: No JVD. Neck Supple. Trachea midline. Atraumatic, normocephalic. Eyes: PERRLA.  Cardiovascular: RRR, + systolic murmur. Edema in left hand -trace Respiratory: CTAB. No rales, rhonchi, or wheezing. On RA.  Abdomen: + bowel sounds, normoactive. No distention or tenderness.  Skin: C/D/I. No apparent lesions. +IV, intact MSK:      No apparent deformity.      Strength: Antigravity against resistance bilateral upper and lower extremities, 4 out of 5 proximally and 5- out of 5 distally.  Subtle weakness in left upper extremity compared to right upper extremity - ongoing  Neurologic exam:  Cognition: AAO to person, place, time and event.  +Mild cognitive, memory deficits -mild delayed recall Insight: Good  insight into current condition.  Mood: Pleasant affect, appropriate mood.  Sensation: Equal and intact in BL UE and Les.  Cranial nerves II through XII grossly intact Mild left upper extremity ataxia   Assessment/Plan: 1. Functional deficits which require 3+ hours per day of interdisciplinary therapy in a comprehensive inpatient rehab setting. Physiatrist is providing close team supervision and  24 hour management of active medical problems listed below. Physiatrist and rehab team continue to assess barriers to discharge/monitor patient progress toward functional and medical goals  Care Tool:  Bathing    Body parts bathed by patient: Right arm, Left arm, Chest, Abdomen, Right upper leg, Left upper leg, Face   Body parts bathed by helper: Left lower leg, Right lower leg, Buttocks, Front perineal area     Bathing assist Assist Level: Moderate Assistance - Patient 50 - 74%     Upper Body Dressing/Undressing Upper body dressing   What is the patient wearing?: Pull  over shirt    Upper body assist Assist Level: Minimal Assistance - Patient > 75%    Lower Body Dressing/Undressing Lower body dressing      What is the patient wearing?: Underwear/pull up     Lower body assist Assist for lower body dressing: Total Assistance - Patient < 25%     Toileting Toileting    Toileting assist Assist for toileting: Total Assistance - Patient < 25%     Transfers Chair/bed transfer  Transfers assist  Chair/bed transfer activity did not occur: Safety/medical concerns  Chair/bed transfer assist level: Moderate Assistance - Patient 50 - 74%     Locomotion Ambulation   Ambulation assist      Assist level: 2 helpers Assistive device: Walker-rolling Max distance: 5'   Walk 10 feet activity   Assist  Walk 10 feet activity did not occur: Safety/medical concerns        Walk 50 feet activity   Assist Walk 50 feet with 2 turns activity did not occur: Safety/medical concerns         Walk 150 feet activity   Assist Walk 150 feet activity did not occur: Safety/medical concerns         Walk 10 feet on uneven surface  activity   Assist Walk 10 feet on uneven surfaces activity did not occur: Safety/medical concerns         Wheelchair     Assist Is the patient using a wheelchair?: Yes Type of Wheelchair: Manual    Wheelchair assist level: Dependent - Patient 0%      Wheelchair 50 feet with 2 turns activity    Assist        Assist Level: Dependent - Patient 0%   Wheelchair 150 feet activity     Assist      Assist Level: Dependent - Patient 0%   Blood pressure 125/83, pulse 82, temperature (!) 97.5 F (36.4 C), resp. rate 15, weight 68.7 kg, SpO2 97%.   Medical Problem List and Plan: 1. Functional deficits secondary to debility after cardiac arrest and multiple related complications             -patient may shower             -ELOS/Goals: 12-14 days, min assist with PT, OT, SLP             -pt  has episodes of fatigue/somnolence at baseline per husband. He says she does better in the later morning and early afternoon.   -stable to continue CIR  2.  Antithrombotics: -DVT/anticoagulation:  Mechanical:  Antiembolism stockings, knee (TED hose) Bilateral lower extremities (age indeterminate left posterior tib DVT). 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             -antiplatelet  therapy: none   3. Pain Management: Tylenol, Norco, oxycodone as needed -Lidoderm patch             -Oxycontin 10 mg q 12 hours             -observe for further sedation. Was somnolent this morning after receiving pain medication.   -10-3: Continue OxyContin 10 mg every 12 hours (is on 13.5 mg every 12 at home) just short acting to home regimen oxycodone 5 mg every 4 hours as needed  4. Mood/Behavior/Sleep: LCSW to evaluate and provide emotional support             -antipsychotic agents: Zyprexa 2.5 mg q HS-at home dose             -Xanax prn -at home dose   5. Neuropsych/cognition: This patient is capable of making decisions on his own behalf.   6. Skin/Wound Care: Routine skin care checks   -10-1: Family inquiring about protein supplementation, will look into supplements given on inpatient.   7. Fluids/Electrolytes/Nutrition: Routine Is and Os and follow-up chemistries   -10-1: See below, potassium borderline, magnesium repleted today.  Rechecking in AM.   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  Magnesium 1.6, replete 2 g IV today and then start 400 mg twice daily due to recurrent drops.  Recheck in AM.             -if patient needs diuretics outpatient due to lower extremity edema then cardiology will consider changing to a potassium sparing diuretic such as spironolactone 25 mg daily along with some potassium supplementation and magnesium             -follow-up with Dr. 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 fluids and repeat in a.m.   17: Anemia: ~3 gram drop in hemoglobin/no overt bleeding             -follow-up CBC. Most recently has been ~7.7; stable on admission labs             -check Hemoccult X 3 -pending             -has hemorrhoids, constipated             -continue PPI, Pepcid   18: DM: A1c = 7.3%, also on Decadron             -start CBGs QID and SSI             -carb modified diet as tolerated  Blood sugars well-controlled, monitor  -10-3: Mild elevation blood sugars over the last  few days, likely with improved diet; not on any diabetic regimen as outpatient.  Monitor 1 day and consider initiating low-dose metformin if no improvement. Recent Labs    06/12/23 1641 06/12/23 1956 06/13/23 0636  GLUCAP 202* 151* 139*       19: Prior post fossa craniotomy for tumor resection 12/02/2020: resection of progressive cerebellar mass by Dr. Venetia Maxon. 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 Has chronic intermittent dizziness>>meclizine prn   - well controlled, no current complaints  -10-3: Will trial scopolamine patch as adjunctive to meclizine for dizziness, vertigo, nausea   20: External hemorrhoids: continue topical hydrocortisone 2.5%   21: Intermittent constipation: check for impaction then decide on appropriate regimen  10-1: Multiple small bowel movements overnight, add MiraLAX 17 mg daily  10-2: Small brown bowel movement this a.m., mushy consistency - improved      LOS: 3 days A FACE TO FACE EVALUATION WAS PERFORMED  Angelina Sheriff 06/13/2023, 10:04 AM

## 2023-06-13 NOTE — IPOC Note (Signed)
Overall Plan of Care [facility]) Patient Details Name: Virginia [name] MRN: [id number] DOB: 1955-11-10  Admitting Diagnosis: Cardiac arrest [facility])  Hospital Problems: Principal Problem:   Cardiac arrest [facility]) Active Problems:   Lung cancer metastatic to brain (HCC)   Debility   History of DVT (deep vein thrombosis)   Urine retention   Acute blood loss anemia   History of ventricular fibrillation     Functional Problem List: Nursing Bladder, Bowel, Safety, Edema, Endurance, Medication Management, Nutrition, Pain, Skin Integrity  PT Balance, Behavior, Endurance, Motor, Pain, Perception, Safety, Skin Integrity  OT Balance, Cognition, Endurance, Edema, Motor, Nutrition, Pain, Safety  SLP Cognition  TR         Basic ADL's: OT Grooming, Bathing, Dressing, Toileting     Advanced  ADL's: OT       Transfers: PT Bed Mobility, Bed to Chair, Car, Occupational psychologist, Research scientist (life sciences): PT Ambulation, Stairs     Additional Impairments: OT Fuctional Use of Upper Extremity  SLP Social Cognition   Problem Solving, Memory, Attention, Awareness  TR      Anticipated Outcomes Item Anticipated Outcome  Self Feeding n/a  Swallowing      Basic self-care  Supervision/CGA  Toileting  CGA   Bathroom Transfers CGA  Bowel/Bladder  manage bowel w mod I and bladder w toileting  Transfers  CGA  Locomotion  CGA with LRAD  Communication     Cognition  MinA  Pain  < 4 with prns  Safety/Judgment  manage w cues   Therapy Plan: PT Intensity: Minimum of 1-2 x/day ,45 to 90 minutes PT Frequency: 5 out of 7 days PT Duration Estimated Length of Stay: 3 weeks OT Intensity: Minimum of 1-2 x/day, 45 to 90 minutes OT Frequency: 5 out of 7 days OT Duration/Estimated Length of Stay: 3 weeks SLP Intensity: Minumum of 1-2 x/day, 30 to 90 minutes SLP Frequency: 3 to 5 out of 7 days SLP Duration/Estimated Length of Stay: 3 weeks   Team Interventions: Nursing Interventions  Patient/Family Education, Pain Management, Medication Management, Bladder Management, Discharge Planning, Bowel Management, Skin Care/Wound Management, Disease Management/Prevention  PT interventions Ambulation/gait training, Discharge planning, Functional mobility training, Psychosocial support, Therapeutic Activities, Visual/perceptual remediation/compensation, Balance/vestibular training, Disease management/prevention, Neuromuscular re-education, Therapeutic Exercise, Skin care/wound management, Wheelchair propulsion/positioning, Cognitive remediation/compensation, DME/adaptive equipment instruction, Pain management, Splinting/orthotics, UE/LE Strength taining/ROM, Firefighter, Equities trader education, Museum/gallery curator, UE/LE Coordination activities  OT Interventions Warden/ranger, Cognitive remediation/compensation, Firefighter, Discharge planning, Disease mangement/prevention, Fish farm manager, Functional electrical stimulation, Functional mobility training, Neuromuscular re-education, Pain management, Patient/family education, Psychosocial support, Self Care/advanced ADL retraining, Skin care/wound managment, Splinting/orthotics, Therapeutic Activities, Therapeutic Exercise, UE/LE Strength taining/ROM, UE/LE Coordination activities, Visual/perceptual remediation/compensation, Wheelchair propulsion/positioning  SLP Interventions Cognitive remediation/compensation, Environmental controls, Financial trader, Functional tasks, Therapeutic Activities, Internal/external aids, Patient/family education, Medication managment  TR Interventions    SW/CM Interventions Discharge Planning, Psychosocial Support, Patient/Family Education   Barriers to Discharge MD  Medical stability, Home enviroment access/loayout, Incontinence, and Lack of/limited family support  Nursing Home environment access/layout, Wound Care 2 level 5 ste left rail w spouse; 1/2 ba on main  18 step up to B+B  PT Inaccessible home environment, Home environment access/layout 4 STE no handrail, full flight of stairs to bedroom  OT      SLP      SW       Team Discharge Planning: Destination: PT-Home ,OT- Home , SLP-Home Projected Follow-up: PT-Outpatient PT, OT-  Home  health OT, SLP-Home Health SLP Projected Equipment Needs: PT-To be determined, OT- To be determined, SLP-None recommended by SLP Equipment Details: PT- , OT-has a RW, rollator, shower chair, BSC, and hospital bed Patient/family involved in discharge planning: PT- Patient, Family member/caregiver,  OT-Patient, Family member/caregiver, SLP-Patient, Family member/caregiver  MD ELOS: 12-14 days Medical Rehab Prognosis:  Good Assessment: The patient has been admitted for CIR therapies with the diagnosis of debility due to cardiac arrest. The team will be addressing functional mobility, strength, stamina, balance, safety, adaptive techniques and equipment, self-care, bowel and bladder mgt, patient and caregiver education. Goals have been set at [facility] assist PT, OT, SLP. Anticipated discharge destination is home.       See Team Conference Notes for weekly updates to the plan of care

## 2023-06-13 NOTE — Progress Notes (Signed)
Speech Language Pathology Daily Session Note  Patient Details  Name: ARIS MOMAN MRN: [id number] Date of Birth: 1955/10/20  Today's Date: 06/13/2023 SLP Individual Time: 4098-1191 SLP Individual Time Calculation (min): 60 min  Short Term Goals: Week 1: SLP Short Term Goal 1 (Week 1): Patient will solve basic environmental problems in 4/5 opportunities given modA. SLP Short Term Goal 2 (Week 1): Patient will attend to the left during functional vision-based tasks (i.e. reading, drawing, etc.) given modA. SLP Short Term Goal 3 (Week 1): Patient will utilize external memory and orientation aids to recall time, day of the week, and basic daily information given modA. SLP Short Term Goal 4 (Week 1): Patient will demonstrate emergent awareness of problems during basic functional tasks in 4/5 opportunities given maxA.  Skilled Therapeutic Interventions: Skilled treatment session focused on cognitive goals. Upon arrival, patient was awake while upright in the wheelchair. Patient reported difficulty using her phone and requested to focus on problem solving, visual scanning, and recall, regarding different apps on her iphone. SLP facilitated session by moving locations of specific apps to the main page of her iphone as well as downloading additional apps of her preference. Patient was able to open her phone and locate the apps with Min verbal cues with additional verbal and visual cues needed to close the app. With multiple repetitions, patient able to use independently with overall Min verbal cues. SLP also educated patient on the "audio" option to maximize ease of sending messages via text. Patient and husband appeared very pleased with adjustments made to improve her overall functional with her phone. Patient left upright in wheelchair with alarm on and all needs within reach. Continue with current plan of care.      Pain No/Denies Pain  Therapy/Group: Individual Therapy  Doreatha Offer,  Khalel Alms 06/13/2023, 3:23 PM

## 2023-06-13 NOTE — Progress Notes (Signed)
Physical Therapy Session Note  Patient Details  Name: Virginia [name] MRN: [id number] Date of Birth: 12-Jun-1956  Today's Date: 06/13/2023 PT Individual Time: 1048-1200 PT Individual Time Calculation (min): 72 min   Short Term Goals: Week 1:  PT Short Term Goal 1 (Week 1): Pt will ambulate 25' with RW min assist PT Short Term Goal 2 (Week 1): Pt will complete up/down 1 step with BUE support mod assist PT Short Term Goal 3 (Week 1): Pt will complete transfers with min assist and LRAD PT Short Term Goal 4 (Week 1): Pt will complete bed mobility with min assist  Skilled Therapeutic Interventions/Progress Updates:    [facility], motivated to participate with PT. Pt denies pain at this time, reports slight dizziness at baseline. Session focused on gait training for increased tolerance to upright mobility and therapeutic exercise to promote upright tolerance and BUE/BLE strengthening.  Pt transported to day room dependently for time management and energy conservation. Pt ambulates 28', 38', and 48' with RW, min assist however fade to mod assist with fatigue for managing RW path, pt path deviates to L with fatigue.  Pt completes standing therex with BUE support on RW to promote BLE strengthening, activity tolerance, and muscle fiber recruitment needed for functional transfers including: Marching x20 alternating BLE Hamstring curl x10 BLE Mini squats x10 Sit<>stand x5 minA progress to CGA  Pt completes stand pivot transfer with RW min/mod assist to/from nustep/WC. Pt completes continuous training on nustep x7 min with BUE/BLE workload 3, rests 2 min and completes additional 1 minute with verbal cues for maintaining neutral L hip, cues for L hand placement.  Pt provided with seated rest breaks between all gait trials and exercises to promote energy conservation and quality with tasks. Pt reports increase in dizziness with fatigue as well as with increased anxiety, cued to take  breaks with controlled breathing with pt demonstrating improved ability to continue with task.  Pt returns to room and remains seated in [facility], CNA to assist with going to restroom at end of session.    Therapy Documentation Precautions:  Precautions Precautions: Fall Restrictions Weight Bearing Restrictions: No   Therapy/Group: Individual Therapy  Edwin Cap PT, DPT 06/13/2023, 12:14 PM

## 2023-06-14 ENCOUNTER — Telehealth: Payer: Self-pay | Admitting: *Deleted

## 2023-06-14 DIAGNOSIS — Z8674 Personal history of sudden cardiac arrest: Secondary | ICD-10-CM

## 2023-06-14 DIAGNOSIS — R5383 Other fatigue: Secondary | ICD-10-CM | POA: Diagnosis not present

## 2023-06-14 DIAGNOSIS — R000 Tachycardia, unspecified: Secondary | ICD-10-CM

## 2023-06-14 DIAGNOSIS — E119 Type 2 diabetes mellitus without complications: Secondary | ICD-10-CM | POA: Diagnosis not present

## 2023-06-14 DIAGNOSIS — Z7984 Long term (current) use of oral hypoglycemic drugs: Secondary | ICD-10-CM | POA: Diagnosis not present

## 2023-06-14 LAB — GLUCOSE, CAPILLARY
Glucose-Capillary: 113 mg/dL — ABNORMAL HIGH (ref 70–99)
Glucose-Capillary: 132 mg/dL — ABNORMAL HIGH (ref 70–99)
Glucose-Capillary: 113 mg/dL — ABNORMAL HIGH (ref 70–99)
Glucose-Capillary: 225 mg/dL — ABNORMAL HIGH (ref 70–99)

## 2023-06-14 NOTE — Plan of Care (Signed)
  Problem: RH BOWEL ELIMINATION Goal: RH STG MANAGE BOWEL WITH ASSISTANCE Description: STG Manage Bowel with toileting Assistance. Outcome: Progressing Goal: RH STG MANAGE BOWEL W/MEDICATION W/ASSISTANCE Description: STG Manage Bowel with Medication with mod I Assistance. Outcome: Progressing   Problem: RH BLADDER ELIMINATION Goal: RH STG MANAGE BLADDER WITH ASSISTANCE Description: STG Manage Bladder With toileting Assistance Outcome: Progressing Goal: RH STG MANAGE BLADDER WITH MEDICATION WITH ASSISTANCE Description: STG Manage Bladder With Medication With mod I Assistance. Outcome: Progressing   Problem: RH SKIN INTEGRITY Goal: RH STG SKIN FREE OF INFECTION/BREAKDOWN Description: Manage w min assist Outcome: Progressing Goal: RH STG MAINTAIN SKIN INTEGRITY WITH ASSISTANCE Description: STG Maintain Skin Integrity With min Assistance. Outcome: Progressing   Problem: RH SAFETY Goal: RH STG ADHERE TO SAFETY PRECAUTIONS W/ASSISTANCE/DEVICE Description: STG Adhere to Safety Precautions With cues Assistance/Device. Outcome: Progressing   Problem: RH PAIN MANAGEMENT Goal: RH STG PAIN MANAGED AT OR BELOW PT'S PAIN GOAL Description: < 4 with prns Outcome: Progressing   Problem: RH KNOWLEDGE DEFICIT GENERAL Goal: RH STG INCREASE KNOWLEDGE OF SELF CARE AFTER HOSPITALIZATION Description: Patient and spouse will be able to manage care at discharge with medications and dietary modification using educational resources independently Outcome: Progressing   Problem: Consults Goal: RH GENERAL PATIENT EDUCATION Description: See Patient Education module for education specifics. Outcome: Progressing

## 2023-06-14 NOTE — Progress Notes (Signed)
PROGRESS NOTE   Subjective/Complaints:  No events overnight.  Had some mild tachycardia and exertional fatigue with therapies after steps this AM; vitals with mild hypertension, otherwise stable.  No changes with laying versus standing.  No hypoxia or shortness of breath.    Patient refused scopolamine patch, states that she believes this was tried prior with Dr. Barbaraann Cao and she may have had a medication reaction to it with rash and hospitalization; adding medication to allergy list.  ROS: Denies fevers, chills, N/V, abdominal pain, shortness of breath, or chest pain.  Per HPI above.   Objective:   No results found. No results for input(s): "WBC", "HGB", "HCT", "PLT" in the last 72 hours.  Recent Labs    06/12/23 0500  NA 140  K 4.0  CL 102  CO2 27  GLUCOSE 113*  BUN 23  CREATININE 1.16*  CALCIUM 8.3*    Intake/Output Summary (Last 24 hours) at 06/14/2023 1417 Last data filed at 06/14/2023 1251 Gross per 24 hour  Intake 480 ml  Output --  Net 480 ml     Pressure Injury 06/03/23 Buttocks Bilateral Deep Tissue Pressure Injury - Purple or maroon localized area of discolored intact skin or blood-filled blister due to damage of underlying soft tissue from pressure and/or shear. purple (Active)  06/03/23 1740  Location: Buttocks  Location Orientation: Bilateral  Staging: Deep Tissue Pressure Injury - Purple or maroon localized area of discolored intact skin or blood-filled blister due to damage of underlying soft tissue from pressure and/or shear.  Wound Description (Comments): purple  Present on Admission: Yes    Physical Exam: Vital Signs Blood pressure (!) 141/82, pulse 81, temperature 97.9 F (36.6 C), temperature source Oral, resp. rate 18, weight 68.7 kg, SpO2 97%.  Constitutional: No apparent distress. Appropriate appearance for age.  Sitting upright on mat in therapy gym.   HENT: No JVD. Atraumatic,  normocephalic. Cardiovascular: RRR, + systolic murmur. Edema in left hand -trace, unchanged.  No peripheral edema in lower extremities. Respiratory: CTAB. No rales, rhonchi, or wheezing. On RA.  Abdomen: + bowel sounds, normoactive. No distention or tenderness.  Skin: C/D/I. No apparent lesions  MSK:      No apparent deformity.       Neurologic exam:  Cognition: AAO to person, place, time  +Mild cognitive, memory deficits -mild delayed recall-improving per husband, does have some at baseline Insight: Good  insight into current condition.  Mood: Pleasant affect, appropriate mood.  Strength: Antigravity against resistance bilateral upper and lower extremities, 4 out of 5 proximally and 5- out of 5 distally.   Mild left upper extremity ataxia   Assessment/Plan: 1. Functional deficits which require 3+ hours per day of interdisciplinary therapy in a comprehensive inpatient rehab setting. Physiatrist is providing close team supervision and 24 hour management of active medical problems listed below. Physiatrist and rehab team continue to assess barriers to discharge/monitor patient progress toward functional and medical goals  Care Tool:  Bathing    Body parts bathed by patient: Right arm, Left arm, Chest, Abdomen, Right upper leg, Left upper leg, Face   Body parts bathed by helper: Left lower leg, Right lower leg, Buttocks, Front  perineal area     Bathing assist Assist Level: Moderate Assistance - Patient 50 - 74%     Upper Body Dressing/Undressing Upper body dressing   What is the patient wearing?: Pull over shirt    Upper body assist Assist Level: Minimal Assistance - Patient > 75%    Lower Body Dressing/Undressing Lower body dressing      What is the patient wearing?: Underwear/pull up     Lower body assist Assist for lower body dressing: Total Assistance - Patient < 25%     Toileting Toileting    Toileting assist Assist for toileting: Total Assistance - Patient <  25%     Transfers Chair/bed transfer  Transfers assist  Chair/bed transfer activity did not occur: Safety/medical concerns  Chair/bed transfer assist level: Moderate Assistance - Patient 50 - 74%     Locomotion Ambulation   Ambulation assist      Assist level: 2 helpers Assistive device: Walker-rolling Max distance: 5'   Walk 10 feet activity   Assist  Walk 10 feet activity did not occur: Safety/medical concerns        Walk 50 feet activity   Assist Walk 50 feet with 2 turns activity did not occur: Safety/medical concerns         Walk 150 feet activity   Assist Walk 150 feet activity did not occur: Safety/medical concerns         Walk 10 feet on uneven surface  activity   Assist Walk 10 feet on uneven surfaces activity did not occur: Safety/medical concerns         Wheelchair     Assist Is the patient using a wheelchair?: Yes Type of Wheelchair: Manual    Wheelchair assist level: Dependent - Patient 0%      Wheelchair 50 feet with 2 turns activity    Assist        Assist Level: Dependent - Patient 0%   Wheelchair 150 feet activity     Assist      Assist Level: Dependent - Patient 0%   Blood pressure (!) 141/82, pulse 81, temperature 97.9 F (36.6 C), temperature source Oral, resp. rate 18, weight 68.7 kg, SpO2 97%.   Medical Problem List and Plan: 1. Functional deficits secondary to debility after cardiac arrest and multiple related complications             -patient may shower             -ELOS/Goals: 12-14 days, min assist with PT, OT, SLP             -pt has episodes of fatigue/somnolence at baseline per husband. He says she does better in the later morning and early afternoon.   -stable to continue CIR  2.  Antithrombotics: -DVT/anticoagulation:  Mechanical:  Antiembolism stockings, knee (TED hose) Bilateral lower extremities (age indeterminate left posterior tib DVT). No anticoagulation given that this is  a known DVT from 3/21 and that she's had a cerebral hemorrhage while on eliquis for that same clot the same year.             -antiplatelet therapy: none   3. Pain Management: Tylenol, Norco, oxycodone as needed -Lidoderm patch             -Oxycontin 10 mg q 12 hours             -observe for further sedation. Was somnolent this morning after receiving pain medication.   -10-3: Continue OxyContin 10 mg every  12 hours (is on 13.5 mg every 12 at home) just short acting to home regimen oxycodone 5 mg every 4 hours as needed -well-controlled  4. Mood/Behavior/Sleep: LCSW to evaluate and provide emotional support             -antipsychotic agents: Zyprexa 2.5 mg q HS-at home dose             -Xanax prn -at home dose   5. Neuropsych/cognition: This patient is capable of making decisions on his own behalf.   6. Skin/Wound Care: Routine skin care checks   -10-1: Family inquiring about protein supplementation, will look into supplements given on inpatient.   7. Fluids/Electrolytes/Nutrition: Routine Is and Os and follow-up chemistries   -10-1: See below, potassium borderline, magnesium repleted today.  Rechecking in AM.   - 10-2: Electrolytes repleted, labs normal, monitor  - 10/4: Encourage p.o. fluids due to tachycardia today; BMP in a.m.  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  Magnesium 1.6, replete 2 g  IV today and then start 400 mg twice daily due to recurrent drops.  Recheck in AM.             -if patient needs diuretics outpatient due to lower extremity edema then cardiology will consider changing to a potassium sparing diuretic such as spironolactone 25 mg daily along with some potassium supplementation and magnesium             -follow-up with Dr. 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 fluids  and repeat in a.m. 10-5   17: Anemia: ~3 gram drop in hemoglobin/no overt bleeding             -follow-up CBC. Most recently has been ~7.7; stable on admission labs             -check Hemoccult X 3 -pending             -has hemorrhoids, constipated             -continue PPI, Pepcid   - CBC in AM d/t tachycardia, fatigue 10/4   18: DM: A1c = 7.3%, also on Decadron             -start CBGs QID and SSI             -carb modified diet as tolerated  Blood sugars well-controlled, monitor  -10-3: Mild elevation blood sugars over the last few days, likely with improved diet; not on any diabetic regimen as outpatient.  Monitor 1 day and consider initiating low-dose metformin if no improvement.   -  10-4: Patient working on dietary adjustments, mildly improved.  Continue to monitor Recent Labs    06/13/23 2104 06/14/23 0630 06/14/23 1201  GLUCAP 151* 113* 132*       19: Prior post fossa craniotomy for tumor resection 12/02/2020: resection of progressive cerebellar mass by Dr. Venetia Maxon. 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 Has chronic intermittent dizziness>>meclizine prn   - well controlled, no current complaints  -10-3: Will trial scopolamine patch as adjunctive to meclizine for dizziness, vertigo, nausea -patient endorses medication reaction to scopolamine in the past, discontinued and added to allergy list   20: External hemorrhoids: continue topical hydrocortisone 2.5%   21: Intermittent constipation: check for impaction then decide on appropriate regimen  10-1: Multiple small bowel movements overnight, add MiraLAX 17 mg daily  10-2: Small brown bowel movement this a.m., mushy consistency - improved  Last bowel movement 10-4      LOS: 4 days A FACE TO FACE EVALUATION WAS PERFORMED  Angelina Sheriff 06/14/2023, 2:17 PM

## 2023-06-14 NOTE — Progress Notes (Signed)
Speech Language Pathology Daily Session Note  Patient Details  Name: Virginia [name] MRN: [id number] Date of Birth: 04/19/1956  Today's Date: 06/14/2023 SLP Individual Time: 1330-1430 SLP Individual Time Calculation (min): 60 min  Short Term Goals: Week 1: SLP Short Term Goal 1 (Week 1): Patient will solve basic environmental problems in 4/5 opportunities given modA. SLP Short Term Goal 2 (Week 1): Patient will attend to the left during functional vision-based tasks (i.e. reading, drawing, etc.) given modA. SLP Short Term Goal 3 (Week 1): Patient will utilize external memory and orientation aids to recall time, day of the week, and basic daily information given modA. SLP Short Term Goal 4 (Week 1): Patient will demonstrate emergent awareness of problems during basic functional tasks in 4/5 opportunities given maxA. SLP Short Term Goal 5 (Week 1): Patient will utilize speech intelligibility strategies at the sentence level with Supervision level verbal cues to achieve~90% intelligibility.  Skilled Therapeutic Interventions: Skilled treatment session focused on cognitive and communication goals. Patient independently recalled seeing this SLP yesterday and was eager to share the variety of tasks she was able to complete on her phone yesterday like sending her daughter an audio message of her singing "Happy Birthday." Discussed earlier conversation with MD regarding patient reporting potential difficulty with thin liquids. Patient reported she feels thin liquids are going well and did not report any concern. Patient observed with taking multiple sips of thin liquids via straw without overt s/s of aspiration. SLP provided education regarding general aspiration precautions and importance of proper positioning with PO intake. Patient and husband verbalized understanding. Patient requested to focus on speech intelligibility, therefore, SLP provided education and handouts regarding speech intelligibility  strategies. Patient utilized strategies at the sentence level during a verbal description task with overall Min verbal cues to achieve ~90% intelligibility. Patient left upright in wheelchair with husband present. Continue with current plan of care.      Pain Pain Assessment Pain Scale: 0-10 Pain Score: 8  Pain Type: Chronic pain Pain Location: Back Pain Orientation: Mid Pain Descriptors / Indicators: Aching Pain Frequency: Constant Pain Onset: On-going Pain Intervention(s): Medication (See eMAR)  Therapy/Group: Individual Therapy  Othell Diluzio 06/14/2023, 3:30 PM

## 2023-06-14 NOTE — Progress Notes (Signed)
Occupational Therapy Session Note  Patient Details  Name: Virginia [name] MRN: [id number] Date of Birth: [birth year]  Today's Date: 06/14/2023 OT Individual Time: 6578-4696 OT Individual Time Calculation (min): 70 min    Short Term Goals: Week 1:  OT Short Term Goal 1 (Week 1): Patient will complete toilet transfer with mod A of 1 OT Short Term Goal 2 (Week 1): Patient will maintain sitting balance at EOB for 5 minutes in preparation for BADL task OT Short Term Goal 3 (Week 1): Patient will perform 1 step of LB dressing task  Skilled Therapeutic Interventions/Progress Updates:   Pt seen for skilled OT session this am. Pt refusing shower offered due to reports of feeling "too sore and cold". OT and husband encouraged with strategies and benefits but pt deferred with statement "we can try tomorrow". OT transported pt to ADL apt and husband accompanied for informal family education and encouragement. OT applied ktape to all 3 heads of L deltoid to provide support and pain management with + report of effectiveness during reaching activity. OT donned TEDS as pt demonstrates B LE edema +2 and issues with dizziness and BP fluctuations. Figure 4 technique for pt to don non-skid slipper socks with set up. Pt completed 4 trials of standing at counter level for functional reach with mod fading to min A for 2 min max and unilateral reach laterally for items 4-5" with 1 UE support. OT transported pt back to rrod, educated on ktape skin protection, provided elevation to L shoulder and issued and trained in foam grasp cube strength training light resistance. 10 reps Bly grasp and pinch rec 3x per day. Left pt with all safety needs, nurse call button and husband bedside.   Pain: soreness in L sh with ktape applied with + report of relief with elevation as well   Therapy Documentation Precautions:  Precautions Precautions: Fall Restrictions Weight Bearing Restrictions: No   Therapy/Group: Individual  Therapy  Vicenta Dunning 06/14/2023, 8:03 AM

## 2023-06-14 NOTE — Progress Notes (Signed)
Physical Therapy Session Note  Patient Details  Name: Virginia [name] MRN: [id number] Date of Birth: [birth year]  Today's Date: 06/14/2023 PT Individual Time: 0904-1015 PT Individual Time Calculation (min): 71 min   Short Term Goals: Week 1:  PT Short Term Goal 1 (Week 1): Pt will ambulate 25' with RW min assist PT Short Term Goal 2 (Week 1): Pt will complete up/down 1 step with BUE support mod assist PT Short Term Goal 3 (Week 1): Pt will complete transfers with min assist and LRAD PT Short Term Goal 4 (Week 1): Pt will complete bed mobility with min assist  Skilled Therapeutic Interventions/Progress Updates:    Pt presents in room in [facility], agreeable to PT. Pt reporting pain in back, premedicated. Session focused on gait training for tolerance to upright and RW mgmt as well as therapeutic exercise to promote improved BUE/BLE strengthening and cardiopulmonary endurance, therapeutic activity for transfer training and assess orthostatics. Pt completes transfers to RW min assist throughout session.  Pt transported to day room dependently for time management. Pt ambulates 58' and 35' with RW min assist for postural stability and RW mgmt due to L path deviation, 2nd person WC follow. Pt requests to terminate activity due to dizziness however demonstrates improved postural stability within RW, improved proximity.  Pt then completes transfer to nustep with RW, completes continuous activity on nustep workload 3, 40-50 SPM with BUE/BLE for a total of 423 steps. Pt demonstrates improved ability to maintain L hand on handle and improved neutral hip on LLE for initial 8 minutes, pt demonstrates fatigue with last two minutes however able to maintain steps per minute.  Pt transfers back to [facility] and requires extended seated rest break following therex. Pt completes stand pivot transfer with RW to mat and begins to report seeing "black spots." Pt completes orthostatic vital signs, requires max assist for  supine<>sit. MD present for orthostatics. Vitals assessed and noted below: - in sitting BP 155/94, HR 113 - in supine BP 138/83, HR 103 - in supine with BLEs elevated BP 144/80, HR 104  Pt completes transfer back to [facility], reports resolution of "spots" in vision. Pt remains seated upright with all needs within reach, call light in place, and husband at bedside at end of session.   Therapy Documentation Precautions:  Precautions Precautions: Fall Restrictions Weight Bearing Restrictions: No  Therapy/Group: Individual Therapy  Edwin Cap PT, DPT 06/14/2023, 10:17 AM

## 2023-06-14 NOTE — Progress Notes (Signed)
Patient ID: Virginia [name], female   DOB: 1956/05/07, 67 y.o.   MRN: [id number] Met with the patient and spouse to review current situation, rehab process, team conference and plan of care. Discussed medications for secondary risk management including HTN, HLD,  A1C (7.3) with CMM diet and nutritional supplements including magic cup. Reported appetite is better; ate all of her lunch "the most she has in a long time" per spouse.  DTI addressed and reviewed pressure relief measures.  Continue to follow along to address educational needs to facilitate preparation for discharge. Pamelia Hoit

## 2023-06-14 NOTE — Telephone Encounter (Signed)
Received call from pt's husband regarding pt's treatment scheduled for next week. Harvie Heck and Karem are asking about future treatment for her cancer. Currently she is in rehab, in-patient at [facility] and making progress. Spoke with pt-she is alert and oriented and sounds like her usual self. Pt will likely be in rehab next week and Harvie Heck is cancelling that appt. He would like input from Dr. [name] and Dr. Barbaraann Cao regarding ongoing treatment. He would like to discuss this with both providers. Advised that I would let them both know of this need.

## 2023-06-14 NOTE — Plan of Care (Signed)
Goal added as patient requests to work on speech intelligibility   Problem: RH Expression Communication Goal: LTG Patient will increase speech intelligibility (SLP) Description: LTG: Patient will increase speech intelligibility at word/phrase/conversation level with cues, % of the time (SLP) Flowsheets (Taken 06/14/2023 1528) LTG: Patient will increase speech intelligibility (SLP): Modified Independent Percent of time patient will use intelligible speech: 100%

## 2023-06-15 DIAGNOSIS — I469 Cardiac arrest, cause unspecified: Secondary | ICD-10-CM | POA: Diagnosis not present

## 2023-06-15 DIAGNOSIS — N179 Acute kidney failure, unspecified: Secondary | ICD-10-CM | POA: Diagnosis not present

## 2023-06-15 DIAGNOSIS — R42 Dizziness and giddiness: Secondary | ICD-10-CM

## 2023-06-15 DIAGNOSIS — R5381 Other malaise: Secondary | ICD-10-CM | POA: Diagnosis not present

## 2023-06-15 LAB — GLUCOSE, CAPILLARY
Glucose-Capillary: 97 mg/dL (ref 70–99)
Glucose-Capillary: 155 mg/dL — ABNORMAL HIGH (ref 70–99)
Glucose-Capillary: 160 mg/dL — ABNORMAL HIGH (ref 70–99)
Glucose-Capillary: 116 mg/dL — ABNORMAL HIGH (ref 70–99)

## 2023-06-15 LAB — BASIC METABOLIC PANEL
Anion gap: 9 (ref 5–15)
BUN: 28 mg/dL — ABNORMAL HIGH (ref 8–23)
CO2: 26 mmol/L (ref 22–32)
Calcium: 8.2 mg/dL — ABNORMAL LOW (ref 8.9–10.3)
Chloride: 104 mmol/L (ref 98–111)
Creatinine, Ser: 1.13 mg/dL — ABNORMAL HIGH (ref 0.44–1.00)
GFR, Estimated: 54 mL/min — ABNORMAL LOW (ref >60)
Glucose, Bld: 207 mg/dL — ABNORMAL HIGH (ref 70–99)
Potassium: 3.4 mmol/L — ABNORMAL LOW (ref 3.5–5.1)
Sodium: 139 mmol/L (ref 135–145)

## 2023-06-15 LAB — MAGNESIUM: Magnesium: 1.9 mg/dL (ref 1.7–2.4)

## 2023-06-15 LAB — CBC
HCT: 23.2 % — ABNORMAL LOW (ref 36.0–46.0)
Hemoglobin: 7.8 g/dL — ABNORMAL LOW (ref 12.0–15.0)
MCH: 34.1 pg — ABNORMAL HIGH (ref 26.0–34.0)
MCHC: 33.6 g/dL (ref 30.0–36.0)
MCV: 101.3 fL — ABNORMAL HIGH (ref 80.0–100.0)
Platelets: 310 10*3/uL (ref 150–400)
RBC: 2.29 MIL/uL — ABNORMAL LOW (ref 3.87–5.11)
RDW: 19.4 % — ABNORMAL HIGH (ref 11.5–15.5)
WBC: 13.3 10*3/uL — ABNORMAL HIGH (ref 4.0–10.5)
nRBC: 0.2 % (ref 0.0–0.2)

## 2023-06-15 MED ORDER — POTASSIUM CHLORIDE CRYS ER 20 MEQ PO TBCR
20.0000 meq | EXTENDED_RELEASE_TABLET | Freq: Every day | ORAL | Status: DC
  Administered 2023-06-15 – 2023-06-16 (×2): 20 meq via ORAL
  Filled 2023-06-15 (×2): qty 1

## 2023-06-15 MED ORDER — MECLIZINE HCL 25 MG PO TABS
25.0000 mg | ORAL_TABLET | Freq: Three times a day (TID) | ORAL | Status: DC
  Administered 2023-06-15 – 2023-06-21 (×18): 25 mg via ORAL
  Filled 2023-06-15 (×18): qty 1

## 2023-06-15 NOTE — Progress Notes (Signed)
PROGRESS NOTE   Subjective/Complaints:  Virginia [name] is up at sink, dressed. Husband at side. Doing well. Asked about resuming mecliizine as they weren't using scopolamine patch. In talking about patch, she may have not had a reaction to it, but they're not sure.   ROS: Patient denies fever, rash, sore throat, blurred vision,  diarrhea, cough, shortness of breath or chest pain, joint or back/neck pain, headache, or mood change.   Objective:   No results found. No results for input(s): "WBC", "HGB", "HCT", "PLT" in the last 72 hours.  No results for input(s): "NA", "K", "CL", "CO2", "GLUCOSE", "BUN", "CREATININE", "CALCIUM" in the last 72 hours.   Intake/Output Summary (Last 24 hours) at 06/15/2023 1403 Last data filed at 06/15/2023 1308 Gross per 24 hour  Intake 840 ml  Output --  Net 840 ml     Pressure Injury 06/03/23 Buttocks Bilateral Deep Tissue Pressure Injury - Purple or maroon localized area of discolored intact skin or blood-filled blister due to damage of underlying soft tissue from pressure and/or shear. purple (Active)  06/03/23 1740  Location: Buttocks  Location Orientation: Bilateral  Staging: Deep Tissue Pressure Injury - Purple or maroon localized area of discolored intact skin or blood-filled blister due to damage of underlying soft tissue from pressure and/or shear.  Wound Description (Comments): purple  Present on Admission: Yes    Physical Exam: Vital Signs Blood pressure 137/85, pulse 94, temperature 99.2 F (37.3 C), temperature source Oral, resp. rate 16, weight 69.8 kg, SpO2 100%.  Constitutional: No distress . Vital signs reviewed. HEENT: NCAT, EOMI, oral membranes moist Neck: supple Cardiovascular: RRR without murmur. No JVD    Respiratory/Chest: CTA Bilaterally without wheezes or rales. Normal effort    GI/Abdomen: BS +, non-tender, non-distended Ext: no clubbing, cyanosis, or edema Psych:  pleasant and cooperative  Skin: C/D/I. No apparent lesions  MSK:      No apparent deformity.       Neurologic exam: very alert, attentive Cognition: AAO to person, place, time  +Mild cognitive, memory deficits -mild delayed recall-improving per husband, does have some at baseline Insight: Good  insight into current condition.  Mood: Pleasant affect, appropriate mood.  Strength: Antigravity against resistance bilateral upper and lower extremities, 4 out of 5 proximally and 5- out of 5 distally.   Mild left upper extremity ataxia   Assessment/Plan: 1. Functional deficits which require 3+ hours per day of interdisciplinary therapy in a comprehensive inpatient rehab setting. Physiatrist is providing close team supervision and 24 hour management of active medical problems listed below. Physiatrist and rehab team continue to assess barriers to discharge/monitor patient progress toward functional and medical goals  Care Tool:  Bathing    Body parts bathed by patient: Right arm, Left arm, Chest, Abdomen, Right upper leg, Left upper leg, Face   Body parts bathed by helper: Left lower leg, Right lower leg, Buttocks, Front perineal area     Bathing assist Assist Level: Moderate Assistance - Patient 50 - 74%     Upper Body Dressing/Undressing Upper body dressing   What is the patient wearing?: Pull over shirt    Upper body assist Assist Level: Minimal Assistance -  Patient > 75%    Lower Body Dressing/Undressing Lower body dressing      What is the patient wearing?: Underwear/pull up     Lower body assist Assist for lower body dressing: Total Assistance - Patient < 25%     Toileting Toileting    Toileting assist Assist for toileting: Total Assistance - Patient < 25%     Transfers Chair/bed transfer  Transfers assist  Chair/bed transfer activity did not occur: Safety/medical concerns  Chair/bed transfer assist level: Moderate Assistance - Patient 50 - 74%      Locomotion Ambulation   Ambulation assist      Assist level: 2 helpers Assistive device: Walker-rolling Max distance: 5'   Walk 10 feet activity   Assist  Walk 10 feet activity did not occur: Safety/medical concerns        Walk 50 feet activity   Assist Walk 50 feet with 2 turns activity did not occur: Safety/medical concerns         Walk 150 feet activity   Assist Walk 150 feet activity did not occur: Safety/medical concerns         Walk 10 feet on uneven surface  activity   Assist Walk 10 feet on uneven surfaces activity did not occur: Safety/medical concerns         Wheelchair     Assist Is the patient using a wheelchair?: Yes Type of Wheelchair: Manual    Wheelchair assist level: Dependent - Patient 0%      Wheelchair 50 feet with 2 turns activity    Assist        Assist Level: Dependent - Patient 0%   Wheelchair 150 feet activity     Assist      Assist Level: Dependent - Patient 0%   Blood pressure 137/85, pulse 94, temperature 99.2 F (37.3 C), temperature source Oral, resp. rate 16, weight 69.8 kg, SpO2 100%.   Medical Problem List and Plan: 1. Functional deficits secondary to debility after cardiac arrest and multiple related complications             -patient may shower             -ELOS/Goals: 12-14 days, min assist with PT, OT, SLP             -pt has episodes of fatigue/somnolence at baseline per husband. He says she does better in the later morning and early afternoon.   -Continue CIR therapies including PT, OT, and SLP   2.  Antithrombotics: -DVT/anticoagulation:  Mechanical:  Antiembolism stockings, knee (TED hose) Bilateral lower extremities (age indeterminate left posterior tib DVT). 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             -antiplatelet therapy: none   3. Pain Management:  Tylenol, Norco, oxycodone as needed -Lidoderm patch             -Oxycontin 10 mg q 12 hours             -observe for further sedation. Was somnolent this morning after receiving pain medication.   -10-3: Continue OxyContin 10 mg every 12 hours (is on 13.5 mg every 12 at home) just short acting to home regimen oxycodone 5 mg every 4 hours as needed     10/5 pain appears controlled.  4. Mood/Behavior/Sleep: LCSW to evaluate and provide emotional support             -  antipsychotic agents: Zyprexa 2.5 mg q HS-at home dose             -Xanax prn -at home dose   5. Neuropsych/cognition: This patient is capable of making decisions on his own behalf.   6. Skin/Wound Care: Routine skin care checks   -10-1: Family inquiring about protein supplementation, will look into supplements given on inpatient.   7. Fluids/Electrolytes/Nutrition: Routine Is and Os and follow-up chemistries   -10-1: See below, potassium borderline, magnesium repleted today.  Rechecking in AM.   - 10-2: Electrolytes repleted, labs normal, monitor  - 10/4: Encourage p.o. fluids due to tachycardia today; BMP in a.m.  -10/5 labs still pending for today. HR around 90 on exam. Pt is drinking fairly well. 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  Magnesium 1.6, replete 2 g IV today and  then start 400 mg twice daily due to recurrent drops.  Recheck in AM.             -if patient needs diuretics outpatient due to lower extremity edema then cardiology will consider changing to a potassium sparing diuretic such as spironolactone 25 mg daily along with some potassium supplementation and magnesium             -follow-up with Dr. 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 fluids, awaiting labs 10/5  17:  Anemia: ~3 gram drop in hemoglobin/no overt bleeding             -follow-up CBC. Most recently has been ~7.7; stable on admission labs             -check Hemoccult X 3 -pending             -has hemorrhoids, constipated             -continue PPI, Pepcid   - cbc still pending 10/5   18: DM: A1c = 7.3%, also on Decadron             -start CBGs QID and SSI             -carb modified diet as tolerated  Blood sugars well-controlled, monitor  -10-3: Mild elevation blood sugars over the last few days, likely with improved diet; not on any diabetic regimen as outpatient.  Monitor 1 day and consider initiating low-dose metformin if no improvement.   - 10-4:  Patient working on dietary adjustments, mildly improved.  Continue to monitor Recent Labs    06/14/23 2114 06/15/23 0612 06/15/23 1125  GLUCAP 225* 97 155*       19: Prior post fossa craniotomy for tumor resection 12/02/2020: resection of progressive cerebellar mass by Dr. Venetia Maxon. 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 Has chronic intermittent dizziness>>meclizine prn   - well controlled, no current complaints  -10/5 will schedule meclizine per pt request   20: External hemorrhoids: continue topical hydrocortisone 2.5%   21: Intermittent constipation: check for impaction then decide on appropriate regimen  10-1: Multiple small bowel movements overnight, add MiraLAX 17 mg daily  10-2: Small brown bowel movement this a.m., mushy consistency - improved  Last bowel movement 10-4      LOS: 5 days A FACE TO FACE EVALUATION WAS PERFORMED  Ranelle Oyster 06/15/2023, 2:03 PM

## 2023-06-15 NOTE — Progress Notes (Signed)
Speech Language Pathology Daily Session Note  Patient Details  Name: Virginia [name] MRN: [id number] Date of Birth: 09/18/1955  Today's Date: 06/15/2023 SLP Individual Time: 1400-1500 SLP Individual Time Calculation (min): 60 min  Short Term Goals: Week 1: SLP Short Term Goal 1 (Week 1): Patient will solve basic environmental problems in 4/5 opportunities given modA. SLP Short Term Goal 2 (Week 1): Patient will attend to the left during functional vision-based tasks (i.e. reading, drawing, etc.) given modA. SLP Short Term Goal 3 (Week 1): Patient will utilize external memory and orientation aids to recall time, day of the week, and basic daily information given modA. SLP Short Term Goal 4 (Week 1): Patient will demonstrate emergent awareness of problems during basic functional tasks in 4/5 opportunities given maxA. SLP Short Term Goal 5 (Week 1): Patient will utilize speech intelligibility strategies at the sentence level with Supervision level verbal cues to achieve~90% intelligibility.  Skilled Therapeutic Interventions: Skilled treatment session focused on cognitive and communication goals. SLP facilitated session by providing education regarding memory compensatory strategies and how to incorporate strategies at home and throughout functional tasks. Both the patient and her husband verbalized understanding and both report they feel her overall short-term recall has improved. SLP facilitated discussion regarding goals of skilled SLP intervention with the patient and her husband. They both report that she would like to return to things like puzzles, cooking, etc if her overall mobility improved enough. SLP provided ways to modify specific tasks like cooking to allow patient to participate in a safe manner (she reported she wanted to make pizza, SLP provided education on how to she could add sauce, cheese, topping, etc while sitting at the table in her wheelchair). SLP also provided examples of  other tasks/activities the patient can do to "occupy her time" such as adult coloring books, etc.  Both verbalized understanding. Throughout conversation, patient was 90% intelligible. Patient left upright in wheelchair with alarm on and all needs within reach. Continue with current plan of care.      Pain Pain Assessment Pain Scale: 0-10 Pain Score: 8  Pain Location: Back Pain Orientation: Mid Pain Descriptors / Indicators: Aching Pain Frequency: Constant Pain Onset: On-going Pain Intervention(s): Medication (See eMAR)  Therapy/Group: Individual Therapy  Bunnie Lederman 06/15/2023, 3:16 PM

## 2023-06-15 NOTE — Plan of Care (Signed)
  Problem: RH BOWEL ELIMINATION Goal: RH STG MANAGE BOWEL WITH ASSISTANCE Description: STG Manage Bowel with toileting Assistance. Outcome: Progressing Goal: RH STG MANAGE BOWEL W/MEDICATION W/ASSISTANCE Description: STG Manage Bowel with Medication with mod I Assistance. Outcome: Progressing   Problem: RH BLADDER ELIMINATION Goal: RH STG MANAGE BLADDER WITH ASSISTANCE Description: STG Manage Bladder With toileting Assistance Outcome: Progressing Goal: RH STG MANAGE BLADDER WITH MEDICATION WITH ASSISTANCE Description: STG Manage Bladder With Medication With mod I Assistance. Outcome: Progressing   Problem: RH SKIN INTEGRITY Goal: RH STG SKIN FREE OF INFECTION/BREAKDOWN Description: Manage w min assist Outcome: Progressing Goal: RH STG MAINTAIN SKIN INTEGRITY WITH ASSISTANCE Description: STG Maintain Skin Integrity With min Assistance. Outcome: Progressing   Problem: RH SAFETY Goal: RH STG ADHERE TO SAFETY PRECAUTIONS W/ASSISTANCE/DEVICE Description: STG Adhere to Safety Precautions With cues Assistance/Device. Outcome: Progressing   Problem: RH PAIN MANAGEMENT Goal: RH STG PAIN MANAGED AT OR BELOW PT'S PAIN GOAL Description: < 4 with prns Outcome: Progressing

## 2023-06-15 NOTE — Progress Notes (Signed)
Occupational Therapy Session Note  Patient Details  Name: Virginia [name] MRN: [id number] Date of Birth: 12/30/1955  Today's Date: 06/15/2023 OT Individual Time: 4098-1191 OT Individual Time Calculation (min): 75 min    Short Term Goals: Week 1:  OT Short Term Goal 1 (Week 1): Patient will complete toilet transfer with mod A of 1 OT Short Term Goal 2 (Week 1): Patient will maintain sitting balance at EOB for 5 minutes in preparation for BADL task OT Short Term Goal 3 (Week 1): Patient will perform 1 step of LB dressing task  Skilled Therapeutic Interventions/Progress Updates:    Pt greeted semi-reclined in bed awake and agreeable to OT treatment session. Pt completed bed mobility with increased time and min A. Pt reported her legs were really sore from therapy the last few days. Family education provided for safety with stand-pivot transfers using the RW. Pt needed mod A for initial stand. OT then educated on toilet transfers with spouse to provide min/mod A pending fatigue. Pt needed assist for clothing management and hygiene wih successful BM and voided bladder. Pt ambulated short distance into shower with RW and min A. Bathing completed with overall min A for standing to wash buttocks. OT educated on hemi dressing techniques with blocked practice and min A for UB and Mod A for LB dressing. Sit<>stands at the sink at min A level. Pt left seated in wc at end of session with spouse present and needs met.  Therapy Documentation Precautions:  Precautions Precautions: Fall Restrictions Weight Bearing Restrictions: No Pain: Pain Assessment Pain Scale: 0-10 Pain Score: 5  Pain Location: Back Pain Orientation: Mid Pain Descriptors / Indicators: Aching Pain Onset: On-going Pain Intervention(s): Medication (See eMAR)      Therapy/Group: Individual Therapy  Mal Amabile 06/15/2023, 8:52 AM

## 2023-06-15 NOTE — Progress Notes (Signed)
Physical Therapy Session Note  Patient Details  Name: Virginia [name] MRN: [id number] Date of Birth: February 16, 1956  Today's Date: 06/15/2023 PT Individual Time: 1020-1117 PT Individual Time Calculation (min): 57 min   Short Term Goals: Week 1:  PT Short Term Goal 1 (Week 1): Pt will ambulate 25' with RW min assist PT Short Term Goal 2 (Week 1): Pt will complete up/down 1 step with BUE support mod assist PT Short Term Goal 3 (Week 1): Pt will complete transfers with min assist and LRAD PT Short Term Goal 4 (Week 1): Pt will complete bed mobility with min assist  Skilled Therapeutic Interventions/Progress Updates:    Pt presents in room in [facility], agreeable to PT. Pt reporting baseline pain in back and soreness in BLEs from previous session. Session focused on transfer training with pt and husband to allow increased independence in room as well as bed mobility training, gait training for short distance, and therapeutic exercise for BLE stretching and strengthening to decrease soreness and improve carryover between sessions.  Pt completes stand pivot transfer with RW and pt husband from [facility] to toilet with therapist providing mod verbal cues for guarding/lifting technique to pt husband for task as well as safety with DME and positioning. Pt continent of urine, completes stand with husband providing assist for periarea hygiene and clothing mgmt with supervision. Pt completes stand pivot transfer with husband back to WC. Pt positioned in WC back in room and therapist educates pt husband on use of stedy, therapist demonstrates stedy transfer WC to bed, pt requiring mod assist to pull to stand in stedy, husband completes transfer bed to [facility] managing all parts of stedy with min verbal cues from therapist.  Pt then transported to day room dependently for time management. Pt completes gait training 15' with RW min assist from [facility] to EOM demonstrating good management of RW for turns with min assist. Pt comes to  sitting EOM. Pt completes sit to supine with max assist verbal cues for sequencing with pt laying down backwards horizontal on mat, repositioned EOM with max assist and with cues for sequencing pt comes down to elbow and requires max assist for BLEs onto mat.  In supine pt tolerates therapeutic exercise and gentle stretching to decrease BLE soreness and improved strength for improved carryover between sessions including: - hamstring stretching BLE x60 seconds - single knee to chest x60 seconds - passive ROM single knee to chest x10 repetitions bilaterally - supine marches alternating BLE x20  Pt completes supine to sit EOM with max assist and verbal cues for sequencing with pt reporting pain in chest with bed mobility secondary to chest compressions. Pt with improved ability to complete with cues for log roll technique.   Pt completes sit<>stand with min assist and ambulates with RW ~20' with 2nd person WC follow min assist, requests to terminate activity due to dizziness.  Pt returns to room and remains seated in [facility] with all needs within reach, cal light in place and husband present at bedside at end of session.   Therapy Documentation Precautions:  Precautions Precautions: Fall Restrictions Weight Bearing Restrictions: No    Therapy/Group: Individual Therapy  Edwin Cap PT, DPT 06/15/2023, 12:27 PM

## 2023-06-15 NOTE — Plan of Care (Signed)
  Problem: RH BOWEL ELIMINATION Goal: RH STG MANAGE BOWEL WITH ASSISTANCE Description: STG Manage Bowel with toileting Assistance. Outcome: Progressing Goal: RH STG MANAGE BOWEL W/MEDICATION W/ASSISTANCE Description: STG Manage Bowel with Medication with mod I Assistance. Outcome: Progressing   Problem: RH BLADDER ELIMINATION Goal: RH STG MANAGE BLADDER WITH ASSISTANCE Description: STG Manage Bladder With toileting Assistance Outcome: Progressing Goal: RH STG MANAGE BLADDER WITH MEDICATION WITH ASSISTANCE Description: STG Manage Bladder With Medication With mod I Assistance. Outcome: Progressing   Problem: RH SKIN INTEGRITY Goal: RH STG SKIN FREE OF INFECTION/BREAKDOWN Description: Manage w min assist Outcome: Progressing Goal: RH STG MAINTAIN SKIN INTEGRITY WITH ASSISTANCE Description: STG Maintain Skin Integrity With min Assistance. Outcome: Progressing   Problem: RH SAFETY Goal: RH STG ADHERE TO SAFETY PRECAUTIONS W/ASSISTANCE/DEVICE Description: STG Adhere to Safety Precautions With cues Assistance/Device. Outcome: Progressing   Problem: RH PAIN MANAGEMENT Goal: RH STG PAIN MANAGED AT OR BELOW PT'S PAIN GOAL Description: < 4 with prns Outcome: Progressing   Problem: RH KNOWLEDGE DEFICIT GENERAL Goal: RH STG INCREASE KNOWLEDGE OF SELF CARE AFTER HOSPITALIZATION Description: Patient and spouse will be able to manage care at discharge with medications and dietary modification using educational resources independently Outcome: Progressing   Problem: Consults Goal: RH GENERAL PATIENT EDUCATION Description: See Patient Education module for education specifics. Outcome: Progressing

## 2023-06-16 ENCOUNTER — Other Ambulatory Visit: Payer: Self-pay | Admitting: Hematology and Oncology

## 2023-06-16 DIAGNOSIS — N179 Acute kidney failure, unspecified: Secondary | ICD-10-CM | POA: Diagnosis not present

## 2023-06-16 DIAGNOSIS — R5381 Other malaise: Secondary | ICD-10-CM | POA: Diagnosis not present

## 2023-06-16 DIAGNOSIS — I469 Cardiac arrest, cause unspecified: Secondary | ICD-10-CM | POA: Diagnosis not present

## 2023-06-16 DIAGNOSIS — R42 Dizziness and giddiness: Secondary | ICD-10-CM | POA: Diagnosis not present

## 2023-06-16 LAB — GLUCOSE, CAPILLARY
Glucose-Capillary: 89 mg/dL (ref 70–99)
Glucose-Capillary: 153 mg/dL — ABNORMAL HIGH (ref 70–99)
Glucose-Capillary: 135 mg/dL — ABNORMAL HIGH (ref 70–99)
Glucose-Capillary: 147 mg/dL — ABNORMAL HIGH (ref 70–99)

## 2023-06-16 LAB — BASIC METABOLIC PANEL
Anion gap: 8 (ref 5–15)
BUN: 26 mg/dL — ABNORMAL HIGH (ref 8–23)
CO2: 28 mmol/L (ref 22–32)
Calcium: 7.9 mg/dL — ABNORMAL LOW (ref 8.9–10.3)
Chloride: 103 mmol/L (ref 98–111)
Creatinine, Ser: 1.17 mg/dL — ABNORMAL HIGH (ref 0.44–1.00)
GFR, Estimated: 52 mL/min — ABNORMAL LOW (ref >60)
Glucose, Bld: 99 mg/dL (ref 70–99)
Potassium: 3.2 mmol/L — ABNORMAL LOW (ref 3.5–5.1)
Sodium: 139 mmol/L (ref 135–145)

## 2023-06-16 MED ORDER — POTASSIUM CHLORIDE CRYS ER 20 MEQ PO TBCR
20.0000 meq | EXTENDED_RELEASE_TABLET | Freq: Two times a day (BID) | ORAL | Status: DC
  Administered 2023-06-16 – 2023-07-02 (×32): 20 meq via ORAL
  Filled 2023-06-16 (×33): qty 1

## 2023-06-16 MED ORDER — POTASSIUM CHLORIDE CRYS ER 20 MEQ PO TBCR
20.0000 meq | EXTENDED_RELEASE_TABLET | Freq: Once | ORAL | Status: AC
  Administered 2023-06-16: 20 meq via ORAL
  Filled 2023-06-16: qty 1

## 2023-06-16 NOTE — Plan of Care (Signed)
  Problem: RH BOWEL ELIMINATION Goal: RH STG MANAGE BOWEL WITH ASSISTANCE Description: STG Manage Bowel with toileting Assistance. Outcome: Progressing Goal: RH STG MANAGE BOWEL W/MEDICATION W/ASSISTANCE Description: STG Manage Bowel with Medication with mod I Assistance. Outcome: Progressing   Problem: RH BLADDER ELIMINATION Goal: RH STG MANAGE BLADDER WITH ASSISTANCE Description: STG Manage Bladder With toileting Assistance Outcome: Progressing Goal: RH STG MANAGE BLADDER WITH MEDICATION WITH ASSISTANCE Description: STG Manage Bladder With Medication With mod I Assistance. Outcome: Progressing   Problem: RH SKIN INTEGRITY Goal: RH STG SKIN FREE OF INFECTION/BREAKDOWN Description: Manage w min assist Outcome: Progressing Goal: RH STG MAINTAIN SKIN INTEGRITY WITH ASSISTANCE Description: STG Maintain Skin Integrity With min Assistance. Outcome: Progressing   Problem: RH SAFETY Goal: RH STG ADHERE TO SAFETY PRECAUTIONS W/ASSISTANCE/DEVICE Description: STG Adhere to Safety Precautions With cues Assistance/Device. Outcome: Progressing   Problem: RH PAIN MANAGEMENT Goal: RH STG PAIN MANAGED AT OR BELOW PT'S PAIN GOAL Description: < 4 with prns Outcome: Progressing   Problem: RH KNOWLEDGE DEFICIT GENERAL Goal: RH STG INCREASE KNOWLEDGE OF SELF CARE AFTER HOSPITALIZATION Description: Patient and spouse will be able to manage care at discharge with medications and dietary modification using educational resources independently Outcome: Progressing   Problem: Consults Goal: RH GENERAL PATIENT EDUCATION Description: See Patient Education module for education specifics. Outcome: Progressing

## 2023-06-16 NOTE — Progress Notes (Signed)
PROGRESS NOTE   Subjective/Complaints:  Pt up in bed. Slept well. Feels well. Husband at bedside  ROS: Patient denies fever, rash, sore throat, blurred vision,  nausea, vomiting, diarrhea, cough, shortness of breath or chest pain, joint or back/neck pain, headache, or mood change.    Objective:   No results found. Recent Labs    06/15/23 1339  WBC 13.3*  HGB 7.8*  HCT 23.2*  PLT 310    Recent Labs    06/15/23 1339 06/16/23 0743  NA 139 139  K 3.4* 3.2*  CL 104 103  CO2 26 28  GLUCOSE 207* 99  BUN 28* 26*  CREATININE 1.13* 1.17*  CALCIUM 8.2* 7.9*     Intake/Output Summary (Last 24 hours) at 06/16/2023 0954 Last data filed at 06/16/2023 0454 Gross per 24 hour  Intake 960 ml  Output --  Net 960 ml     Pressure Injury 06/03/23 Buttocks Bilateral Deep Tissue Pressure Injury - Purple or maroon localized area of discolored intact skin or blood-filled blister due to damage of underlying soft tissue from pressure and/or shear. purple (Active)  06/03/23 1740  Location: Buttocks  Location Orientation: Bilateral  Staging: Deep Tissue Pressure Injury - Purple or maroon localized area of discolored intact skin or blood-filled blister due to damage of underlying soft tissue from pressure and/or shear.  Wound Description (Comments): purple  Present on Admission: Yes    Physical Exam: Vital Signs Blood pressure 131/71, pulse 75, temperature 97.9 F (36.6 C), resp. rate 18, weight 73.8 kg, SpO2 97%.  Constitutional: No distress . Vital signs reviewed. HEENT: NCAT, EOMI, oral membranes moist Neck: supple Cardiovascular: RRR without murmur. No JVD    Respiratory/Chest: CTA Bilaterally without wheezes or rales. Normal effort    GI/Abdomen: BS +, non-tender, non-distended Ext: no clubbing, cyanosis, or edema Psych: pleasant and cooperative  Skin: C/D/I. No apparent lesions  MSK:      No apparent deformity.        Neurologic exam: alert and attentive Cognition: AAO to person, place, time  +Mild cognitive, memory deficits -mild delayed recall-improving per husband, does have some at baseline Insight: reasonable  insight into current condition.  Mood: Pleasant affect, appropriate mood.  Strength: Antigravity against resistance bilateral upper and lower extremities, 4 out of 5 proximally and 5- out of 5 distally.   LUE ataxia   Assessment/Plan: 1. Functional deficits which require 3+ hours per day of interdisciplinary therapy in a comprehensive inpatient rehab setting. Physiatrist is providing close team supervision and 24 hour management of active medical problems listed below. Physiatrist and rehab team continue to assess barriers to discharge/monitor patient progress toward functional and medical goals  Care Tool:  Bathing    Body parts bathed by patient: Right arm, Left arm, Chest, Abdomen, Right upper leg, Left upper leg, Face   Body parts bathed by helper: Left lower leg, Right lower leg, Buttocks, Front perineal area     Bathing assist Assist Level: Moderate Assistance - Patient 50 - 74%     Upper Body Dressing/Undressing Upper body dressing   What is the patient wearing?: Pull over shirt    Upper body assist Assist Level: Minimal  Assistance - Patient > 75%    Lower Body Dressing/Undressing Lower body dressing      What is the patient wearing?: Underwear/pull up     Lower body assist Assist for lower body dressing: Total Assistance - Patient < 25%     Toileting Toileting    Toileting assist Assist for toileting: Total Assistance - Patient < 25%     Transfers Chair/bed transfer  Transfers assist  Chair/bed transfer activity did not occur: Safety/medical concerns  Chair/bed transfer assist level: Moderate Assistance - Patient 50 - 74%     Locomotion Ambulation   Ambulation assist      Assist level: 2 helpers Assistive device: Walker-rolling Max distance:  5'   Walk 10 feet activity   Assist  Walk 10 feet activity did not occur: Safety/medical concerns        Walk 50 feet activity   Assist Walk 50 feet with 2 turns activity did not occur: Safety/medical concerns         Walk 150 feet activity   Assist Walk 150 feet activity did not occur: Safety/medical concerns         Walk 10 feet on uneven surface  activity   Assist Walk 10 feet on uneven surfaces activity did not occur: Safety/medical concerns         Wheelchair     Assist Is the patient using a wheelchair?: Yes Type of Wheelchair: Manual    Wheelchair assist level: Dependent - Patient 0%      Wheelchair 50 feet with 2 turns activity    Assist        Assist Level: Dependent - Patient 0%   Wheelchair 150 feet activity     Assist      Assist Level: Dependent - Patient 0%   Blood pressure 131/71, pulse 75, temperature 97.9 F (36.6 C), resp. rate 18, weight 73.8 kg, SpO2 97%.   Medical Problem List and Plan: 1. Functional deficits secondary to debility after cardiac arrest and multiple related complications             -patient may shower             -ELOS/Goals: 12-14 days, min assist with PT, OT, SLP             -pt has episodes of fatigue/somnolence at baseline per husband. He says she does better in the later morning and early afternoon.   -Continue CIR therapies including PT, OT, and SLP    2.  Antithrombotics: -DVT/anticoagulation:  Mechanical:  Antiembolism stockings, knee (TED hose) Bilateral lower extremities (age indeterminate left posterior tib DVT). 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             -antiplatelet therapy: none   3. Pain Management: Tylenol, Norco, oxycodone as needed -Lidoderm patch             -Oxycontin 10 mg q 12 hours             -observe for further sedation. Was somnolent this  morning after receiving pain medication.   -10-3: Continue OxyContin 10 mg every 12 hours (is on 13.5 mg every 12 at home) just short acting to home regimen oxycodone 5 mg every 4 hours as needed     10/5-6 pain appears controlled.  4. Mood/Behavior/Sleep: LCSW to evaluate and provide emotional support             -  antipsychotic agents: Zyprexa 2.5 mg q HS-at home dose             -Xanax prn -at home dose   5. Neuropsych/cognition: This patient is capable of making decisions on his own behalf.   6. Skin/Wound Care: Routine skin care checks   -10-1: Family inquiring about protein supplementation, will look into supplements given on inpatient.   7. Fluids/Electrolytes/Nutrition: Routine Is and Os and follow-up chemistries   -10-1: See below, potassium borderline, magnesium repleted today.  Rechecking in AM.   - 10-2: Electrolytes repleted, labs normal, monitor  - 10/4: Encourage p.o. fluids due to tachycardia today; BMP in a.m.  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  Magnesium 1.6, replete 2 g IV today and then start 400 mg twice daily due to recurrent drops.  Recheck in AM.             -if patient needs diuretics outpatient due to lower extremity edema then cardiology  will consider changing to a potassium sparing diuretic such as spironolactone 25 mg daily along with some potassium supplementation and magnesium             -follow-up with Dr. 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 fluids, labs stable, f/u monday  17: Anemia: ~3 gram drop in hemoglobin/no overt bleeding             -follow-up CBC. Most recently has been ~7.7; stable on admission labs             -check Hemoccult X 3 -pending             -has hemorrhoids, constipated             -continue PPI, Pepcid   - hgb stable at 7.8 on 10/5   18: DM: A1c = 7.3%, also on Decadron             -start CBGs QID and SSI             -carb modified diet as tolerated  Blood sugars well-controlled, monitor  -10-3: Mild elevation blood sugars over the last few days, likely with improved diet; not on any diabetic regimen as outpatient.  Monitor 1 day and consider initiating low-dose metformin if no improvement.   - 10-4: Patient working on dietary adjustments, mildly improved.  Continue to monitor Recent Labs    06/15/23 1626  06/15/23 2144 06/16/23 0611  GLUCAP 160* 116* 89    10/6 sugars have been elevated due to steroids--decadron down to 1mg  daily and readings improved except for readings around dinner time   -continue current cbg checks and SSI for now   -may be able to stop these soon    19: Prior post fossa craniotomy for tumor resection 12/02/2020: resection of progressive cerebellar mass by Dr. Venetia Maxon. 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 Has chronic intermittent dizziness>>meclizine prn   - well controlled, no current complaints  -10/5 scheduled meclizine 25mg  tid per pt request   20: External hemorrhoids: continue topical hydrocortisone 2.5%   21: Intermittent constipation: check for impaction then decide on appropriate regimen  10-1: Multiple small bowel movements overnight, add MiraLAX 17 mg daily  10-2: Small brown bowel movement this a.m., mushy consistency - improved  Last bowel movement 10/4--hopeful for BM today 10/6      LOS: 6 days A FACE TO FACE EVALUATION WAS PERFORMED  Ranelle Oyster 06/16/2023, 9:54 AM

## 2023-06-17 DIAGNOSIS — I469 Cardiac arrest, cause unspecified: Secondary | ICD-10-CM | POA: Diagnosis not present

## 2023-06-17 LAB — BASIC METABOLIC PANEL
Anion gap: 12 (ref 5–15)
BUN: 27 mg/dL — ABNORMAL HIGH (ref 8–23)
CO2: 23 mmol/L (ref 22–32)
Calcium: 8.1 mg/dL — ABNORMAL LOW (ref 8.9–10.3)
Chloride: 104 mmol/L (ref 98–111)
Creatinine, Ser: 1.2 mg/dL — ABNORMAL HIGH (ref 0.44–1.00)
GFR, Estimated: 50 mL/min — ABNORMAL LOW (ref >60)
Glucose, Bld: 99 mg/dL (ref 70–99)
Potassium: 3.8 mmol/L (ref 3.5–5.1)
Sodium: 139 mmol/L (ref 135–145)

## 2023-06-17 LAB — CBC
HCT: 25.1 % — ABNORMAL LOW (ref 36.0–46.0)
Hemoglobin: 8.2 g/dL — ABNORMAL LOW (ref 12.0–15.0)
MCH: 34.2 pg — ABNORMAL HIGH (ref 26.0–34.0)
MCHC: 32.7 g/dL (ref 30.0–36.0)
MCV: 104.6 fL — ABNORMAL HIGH (ref 80.0–100.0)
Platelets: 262 10*3/uL (ref 150–400)
RBC: 2.4 MIL/uL — ABNORMAL LOW (ref 3.87–5.11)
RDW: 20.8 % — ABNORMAL HIGH (ref 11.5–15.5)
WBC: 11.8 10*3/uL — ABNORMAL HIGH (ref 4.0–10.5)
nRBC: 0 % (ref 0.0–0.2)

## 2023-06-17 LAB — GLUCOSE, CAPILLARY
Glucose-Capillary: 104 mg/dL — ABNORMAL HIGH (ref 70–99)
Glucose-Capillary: 122 mg/dL — ABNORMAL HIGH (ref 70–99)
Glucose-Capillary: 155 mg/dL — ABNORMAL HIGH (ref 70–99)
Glucose-Capillary: 140 mg/dL — ABNORMAL HIGH (ref 70–99)

## 2023-06-17 MED ORDER — POLYETHYLENE GLYCOL 3350 17 G PO PACK
17.0000 g | PACK | Freq: Two times a day (BID) | ORAL | Status: DC
  Administered 2023-06-17 – 2023-07-02 (×27): 17 g via ORAL
  Filled 2023-06-17 (×30): qty 1

## 2023-06-17 MED ORDER — HYDROCORTISONE (PERIANAL) 2.5 % EX CREA
TOPICAL_CREAM | Freq: Three times a day (TID) | CUTANEOUS | Status: DC | PRN
  Filled 2023-06-17: qty 28.35

## 2023-06-17 MED ORDER — SENNOSIDES-DOCUSATE SODIUM 8.6-50 MG PO TABS
2.0000 | ORAL_TABLET | Freq: Every day | ORAL | Status: DC | PRN
  Administered 2023-06-17: 2 via ORAL
  Filled 2023-06-17: qty 2

## 2023-06-17 NOTE — Progress Notes (Signed)
Occupational Therapy Weekly Progress Note  Patient Details  Name: Virginia [name] MRN: [id number] Date of Birth: 23-Jun-1956  Beginning of progress report period: June 11, 2023 End of progress report period: June 17, 2023  Today's Date: 06/17/2023 OT Individual Time: 8295-6213 OT Individual Time Calculation (min): 53 min    Patient has met 3 of 3 short term goals.  Patient is making steady progress towards OT goals. She is at an overall mod A level for BADL tasks with some slightly improved endurance this week. She is still very deconditioned and tires out quickly. Continue current POC.   Patient continues to demonstrate the following deficits: muscle weakness, decreased cardiorespiratoy endurance, and decreased sitting balance, decreased standing balance, decreased postural control, hemiplegia, and decreased balance strategies and therefore will continue to benefit from skilled OT intervention to enhance overall performance with BADL and Reduce care partner burden.  Patient progressing toward long term goals..  Continue plan of care.  OT Short Term Goals Week 1:  OT Short Term Goal 1 (Week 1): Patient will complete toilet transfer with mod A of 1 OT Short Term Goal 1 - Progress (Week 1): Met OT Short Term Goal 2 (Week 1): Patient will maintain sitting balance at EOB for 5 minutes in preparation for BADL task OT Short Term Goal 2 - Progress (Week 1): Met OT Short Term Goal 3 (Week 1): Patient will perform 1 step of LB dressing task OT Short Term Goal 3 - Progress (Week 1): Met Week 2:  OT Short Term Goal 1 (Week 2): Pt will tolerate standing for 4 minutes within BADL task for functional endurance OT Short Term Goal 2 (Week 2): Patient will perform 1 step of toileting task OT Short Term Goal 3 (Week 2): Patient will don shirt with set-up A  Skilled Therapeutic Interventions/Progress Updates:    Pt greeted sitting upright in bed with breakfast tray just set-up. Pt requested to  finish eating. OT returned and pt agreeable to OT treatment session. Pt much more fatigued and shaky this morning. She needed mod A to get to standing and facilitation to achieve anterior weight shift. Pt ambulated 7 feet to wc in hallway before reaching max fatigue. Pt ambulated another 7 feet in gym w/ RW and min A. Extended rest break required after walking. Addressed standing balance/endurance with standing corn hole toss. Pt tolerated 3 minutes in standing for activity with min A for balance. Increased balance challenge with stepping to throw bean bag. Pt then completed 5 minutes on Nustep For generalized strength/endurance. Pt ambulated to wc 5 feet with min A and mod cues for RW positioning. Pt returned to room and left seated in wc with husband present and needs met.   Therapy Documentation Precautions:  Precautions Precautions: Fall Restrictions Weight Bearing Restrictions: No Vital Signs: Therapy Vitals Resp: 17 Pain: Pain Assessment Pain Scale: 0-10 Pain Score: 7  Pain Type: Chronic pain Pain Location: Back Pain Orientation: Mid Pain Descriptors / Indicators: Aching Pain Frequency: Constant Pain Onset: On-going Pain Intervention(s): Medication (See eMAR) Therapy/Group: Individual Therapy  Mal Amabile 06/17/2023, 9:42 AM

## 2023-06-17 NOTE — Progress Notes (Signed)
PROGRESS NOTE   Subjective/Complaints:  After tapering dexamethasone to 1 mg on Friday, patient has been noticing increased dizziness.  Also some left upper extremity weakness, shaking today.  No other notable neurologic deficits.  No events overnight.  Labs stable this a.m.  Blood glucose elevated 10-5, but appears to have improved on subsequent checks.  Stable.  Complaining of 7 out of 8 mid back pain throughout last night. Continent of bowel and bladder.  Small, hard bowel movement this a.m.  ROS: Pertinent positives per HPI above.  Patient denies fever, rash, sore throat, blurred vision,  nausea, vomiting, diarrhea, cough, shortness of breath or chest pain, joint or back/neck pain, headache, or mood change.    Objective:   No results found. Recent Labs    06/15/23 1339 06/17/23 0715  WBC 13.3* 11.8*  HGB 7.8* 8.2*  HCT 23.2* 25.1*  PLT 310 262    Recent Labs    06/16/23 0743 06/17/23 0715  NA 139 139  K 3.2* 3.8  CL 103 104  CO2 28 23  GLUCOSE 99 99  BUN 26* 27*  CREATININE 1.17* 1.20*  CALCIUM 7.9* 8.1*     Intake/Output Summary (Last 24 hours) at 06/17/2023 0803 Last data filed at 06/17/2023 0213 Gross per 24 hour  Intake 480 ml  Output 200 ml  Net 280 ml     Pressure Injury 06/03/23 Buttocks Bilateral Deep Tissue Pressure Injury - Purple or maroon localized area of discolored intact skin or blood-filled blister due to damage of underlying soft tissue from pressure and/or shear. purple (Active)  06/03/23 1740  Location: Buttocks  Location Orientation: Bilateral  Staging: Deep Tissue Pressure Injury - Purple or maroon localized area of discolored intact skin or blood-filled blister due to damage of underlying soft tissue from pressure and/or shear.  Wound Description (Comments): purple  Present on Admission: Yes    Physical Exam: Vital Signs Blood pressure (!) 147/78, pulse 74, temperature 97.7 F  (36.5 C), temperature source Oral, resp. rate 18, weight 73.8 kg, SpO2 100%.  Constitutional: No distress . Vital signs reviewed.  Laying in bed.  Husband at bedside. HEENT: NCAT, EOMI, oral membranes moist.  No notable nystagmus on EOMI. Neck: supple Cardiovascular: RRR without murmur. No JVD    Respiratory/Chest: CTA Bilaterally without wheezes or rales. Normal effort    GI/Abdomen: BS +, non-tender, non-distended Ext: no clubbing, cyanosis, or edema Psych: pleasant and cooperative  Skin: C/D/I. No apparent lesions.  Bilateral buttocks bruising as below.   MSK:      No apparent deformity.       Neurologic exam: alert and attentive Cognition: AAO to person, place, time  +Mild cognitive, memory deficits -mild delayed recall\ Insight: reasonable  insight into current condition.  Mood: Pleasant affect, appropriate mood.  Strength: Antigravity against resistance bilateral upper and lower extremities, 4 out of 5 proximally and 5- out of 5 distally.  Unchanged. LUE ataxia -ongoing, mildly increased today but no other focal deficits   Assessment/Plan: 1. Functional deficits which require 3+ hours per day of interdisciplinary therapy in a comprehensive inpatient rehab setting. Physiatrist is providing close team supervision and 24 hour management of active medical  problems listed below. Physiatrist and rehab team continue to assess barriers to discharge/monitor patient progress toward functional and medical goals  Care Tool:  Bathing    Body parts bathed by patient: Right arm, Left arm, Chest, Abdomen, Right upper leg, Left upper leg, Face   Body parts bathed by helper: Left lower leg, Right lower leg, Buttocks, Front perineal area     Bathing assist Assist Level: Moderate Assistance - Patient 50 - 74%     Upper Body Dressing/Undressing Upper body dressing   What is the patient wearing?: Pull over shirt    Upper body assist Assist Level: Minimal Assistance - Patient > 75%     Lower Body Dressing/Undressing Lower body dressing      What is the patient wearing?: Underwear/pull up     Lower body assist Assist for lower body dressing: Total Assistance - Patient < 25%     Toileting Toileting    Toileting assist Assist for toileting: Total Assistance - Patient < 25%     Transfers Chair/bed transfer  Transfers assist  Chair/bed transfer activity did not occur: Safety/medical concerns  Chair/bed transfer assist level: Moderate Assistance - Patient 50 - 74%     Locomotion Ambulation   Ambulation assist      Assist level: 2 helpers Assistive device: Walker-rolling Max distance: 5'   Walk 10 feet activity   Assist  Walk 10 feet activity did not occur: Safety/medical concerns        Walk 50 feet activity   Assist Walk 50 feet with 2 turns activity did not occur: Safety/medical concerns         Walk 150 feet activity   Assist Walk 150 feet activity did not occur: Safety/medical concerns         Walk 10 feet on uneven surface  activity   Assist Walk 10 feet on uneven surfaces activity did not occur: Safety/medical concerns         Wheelchair     Assist Is the patient using a wheelchair?: Yes Type of Wheelchair: Manual    Wheelchair assist level: Dependent - Patient 0%      Wheelchair 50 feet with 2 turns activity    Assist        Assist Level: Dependent - Patient 0%   Wheelchair 150 feet activity     Assist      Assist Level: Dependent - Patient 0%   Blood pressure (!) 147/78, pulse 74, temperature 97.7 F (36.5 C), temperature source Oral, resp. rate 18, weight 73.8 kg, SpO2 100%.   Medical Problem List and Plan: 1. Functional deficits secondary to debility after cardiac arrest and multiple related complications             -patient may shower             -ELOS/Goals: 12-14 days, min assist with PT, OT, SLP             -pt has episodes of fatigue/somnolence at baseline per husband.  He says she does better in the later morning and early afternoon.   -Continue CIR therapies including PT, OT, and SLP    2.  Antithrombotics: -DVT/anticoagulation:  Mechanical:  Antiembolism stockings, knee (TED hose) Bilateral lower extremities (age indeterminate left posterior tib DVT). 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             -  antiplatelet therapy: none   3. Pain Management: Tylenol, Norco, oxycodone as needed -Lidoderm patch             -Oxycontin 10 mg q 12 hours             -observe for further sedation. Was somnolent this morning after receiving pain medication.   -10-3: Continue OxyContin 10 mg every 12 hours (is on 13.5 mg every 12 at home) just short acting to home regimen oxycodone 5 mg every 4 hours as needed     10/5-6 pain appears controlled.  4. Mood/Behavior/Sleep: LCSW to evaluate and provide emotional support             -antipsychotic agents: Zyprexa 2.5 mg q HS-at home dose             -Xanax prn -at home dose   5. Neuropsych/cognition: This patient is capable of making decisions on his own behalf.   6. Skin/Wound Care: Routine skin care checks   -10-1: Family inquiring about protein supplementation, will look into supplements given on inpatient.   7. Fluids/Electrolytes/Nutrition: Routine Is and Os and follow-up chemistries   -10-1: See below, potassium borderline, magnesium repleted today.  Rechecking in AM.   - 10-2: Electrolytes repleted, labs normal, monitor  - 10/4: Encourage p.o. fluids due to tachycardia today; BMP in a.m.  -10/6 Potassium 3.2--increase supplement  -BUN/Cr holding fairly steady--continue to push fluids -10-7: Potassium 3.8.  Continue current supplement.  BUN/creatinine stable.    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  Will monitor for any functional decline, and may reach out to neuro-oncology for prolonged taper depending on symptom severity.   10: Bilateral rib fractures due to chest compressions -pain control as above   11: V-fib cardiac arrest:  -continue Pacerone 400 mg daily for one week, then 200 mg daily on 10/08             -not a candidate for invasive intervention             -K > 4; Mg > 2 -potassium 3.8 on admission labs, recheck in AM.  Magnesium 1.6, replete 2 g IV today and then start 400 mg twice daily due to recurrent drops.  Recheck in AM.             -if patient needs diuretics outpatient due to lower extremity edema then cardiology will consider changing to a potassium sparing diuretic such as spironolactone 25 mg daily along with some potassium supplementation and magnesium             -follow-up with Dr. 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 fluids, labs stable, f/u monday  10-7: Creatinine stabilizing at 1.1-1.2.  17: Anemia: ~3 gram drop in hemoglobin/no overt bleeding             -follow-up CBC. Most recently has been ~7.7; stable on admission labs             -check Hemoccult X 3 -pending             -has hemorrhoids, constipated             -continue PPI, Pepcid   - hgb stable at 7.8 on 10/5; greater than 8 10/7   18: DM: A1c = 7.3%, also on Decadron             -start CBGs QID and SSI             -carb modified diet as tolerated  Blood sugars well-controlled, monitor  -10-3: Mild elevation blood sugars over the last few days, likely with improved diet; not on any diabetic regimen as outpatient.  Monitor 1 day and consider initiating low-dose metformin if no improvement.   - 10-4: Patient working on dietary adjustments, mildly improved.  Continue to monitor Recent Labs    06/16/23 1644 06/16/23 2110 06/17/23 0547  GLUCAP 135* 147* 104*    10/6 sugars have been elevated due to steroids--decadron down to 1mg  daily and readings improved except for readings around dinner time   -continue current cbg checks and SSI for now   -may be able to stop these soon 10-7 improved, monitor    19: Prior post fossa craniotomy for tumor resection 12/02/2020: resection of progressive cerebellar mass by Dr. Venetia Maxon. 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 Has chronic intermittent dizziness>>meclizine prn   - well controlled, no current complaints  -10/5 scheduled meclizine 25mg  tid per pt request   20: External hemorrhoids: continue topical hydrocortisone 2.5%; reordered today, as suppository was prior ordered.   21: Intermittent constipation: check for impaction then decide on appropriate regimen  10-1: Multiple small bowel movements overnight, add MiraLAX 17 mg daily  10-2: Small brown bowel movement this a.m., mushy consistency -  improved  Last bowel movement 10/4--hopeful for BM today 10/6  Small bowel movement 10-7, increase MiraLAX to twice daily and add as needed Senokot per family request      LOS: 7 days A FACE TO FACE EVALUATION WAS PERFORMED  Angelina Sheriff 06/17/2023, 8:03 AM

## 2023-06-17 NOTE — Progress Notes (Signed)
Occupational Therapy Session Note  Patient Details  Name: Virginia [name] MRN: [id number] Date of Birth: 1956/09/07  Today's Date: 06/17/2023 OT Individual Time: 1415-1445 OT Individual Time Calculation (min): 30 min   Short Term Goals: Week 2:  OT Short Term Goal 1 (Week 2): Pt will tolerate standing for 4 minutes within BADL task for functional endurance OT Short Term Goal 2 (Week 2): Patient will perform 1 step of toileting task OT Short Term Goal 3 (Week 2): Patient will don shirt with set-up A  Skilled Therapeutic Interventions/Progress Updates:    Pt greeted in [place] transferring out of the bathroom with spouse. Pt took seated rest break in wc, but then stated she was too fatigued to go to the gym. Pt agreeable to do UB there-ex seated EOB. Pt completed 3 sets of 10 bicep curl, seated row, and straight arm raise using red theraband.Pt reported max fatigue and requested to return to bed. Pt missed 15 minutes of OT treatment session due to fatigue.   Therapy Documentation Precautions:  Precautions Precautions: Fall Restrictions Weight Bearing Restrictions: No Pain:  Pt reports 9/10 back pain and hemorrhoid pain   Therapy/Group: Individual Therapy  Mal Amabile 06/17/2023, 3:12 PM

## 2023-06-17 NOTE — Progress Notes (Signed)
Physical Therapy Session Note  Patient Details  Name: Virginia [name] MRN: [id number] Date of Birth: June 05, 1956  Today's Date: 06/17/2023 PT Individual Time: 1121-1203 PT Individual Time Calculation (min): 42 min   Short Term Goals: Week 1:  PT Short Term Goal 1 (Week 1): Pt will ambulate 25' with RW min assist PT Short Term Goal 2 (Week 1): Pt will complete up/down 1 step with BUE support mod assist PT Short Term Goal 3 (Week 1): Pt will complete transfers with min assist and LRAD PT Short Term Goal 4 (Week 1): Pt will complete bed mobility with min assist  Skilled Therapeutic Interventions/Progress Updates:    Pt presents in room in [facility] with pt husband present in room. Pt agreeable to PT, continues to report soreness in BLEs as well as pain in back at baseline. Pt reporting increased dizziness at rest this session, premedicated. Session focused on gait training to promote improved tolerance to upright as well as dynamic standing balance.   Pt transported to day room dependently for time management. Pt completes gait 16', 23', 25' with RW min/mod assist and 2nd person WC follow, progress to mod with fatigue with pt demonstrating excessive BUE weightbearing on RW and decreased proximity to RW with fatigue. Pt provided with x60 sec hamstring stretch in sitting during rest break bilaterally to decrease soreness in BLEs. Pt does demonstrate decreased stability in LLE, decreased stance time however no knee buckle noted. HR monitored between trials with pt maintaining HR WNL 93-96 bpm following ambulation.  Pt returns to room and remains seated in [facility] with all needs within reach, cal light in place and husband at bedside at end of session.  Therapy Documentation Precautions:  Precautions Precautions: Fall Restrictions Weight Bearing Restrictions: No   Therapy/Group: Individual Therapy  Edwin Cap PT, DPT 06/17/2023, 12:05 PM

## 2023-06-17 NOTE — Progress Notes (Signed)
Speech Language Pathology Daily Session Note  Patient Details  Name: Virginia [name] MRN: [id number] Date of Birth: 04/16/1956  Today's Date: 06/17/2023 SLP Individual Time: 1015-1110 SLP Individual Time Calculation (min): 55 min  Short Term Goals: Week 1: SLP Short Term Goal 1 (Week 1): Patient will solve basic environmental problems in 4/5 opportunities given modA. SLP Short Term Goal 2 (Week 1): Patient will attend to the left during functional vision-based tasks (i.e. reading, drawing, etc.) given modA. SLP Short Term Goal 3 (Week 1): Patient will utilize external memory and orientation aids to recall time, day of the week, and basic daily information given modA. SLP Short Term Goal 4 (Week 1): Patient will demonstrate emergent awareness of problems during basic functional tasks in 4/5 opportunities given maxA. SLP Short Term Goal 5 (Week 1): Patient will utilize speech intelligibility strategies at the sentence level with Supervision level verbal cues to achieve~90% intelligibility.  Skilled Therapeutic Interventions: Skilled treatment session focused on cognitive and communication goals. Patient recalled her current speech intelligibility goals with overall Mod I. Patient participated in a reading task at the sentence and short paragraph level overall Supervision level verbal cues for use of strategies to achieve ~90% intelligibility in a mildly noisy environment (radio on).  Patient demonstrated some difficulty reading aloud, suspect due to decreased visual acuity and decreased attention. SLP utilized a visual aid to help minimize visual distractions which appeared to assist with accuracy.  SLP provided examples of simple reading materials to assist with practicing reading aloud like a daily devotional. Patient and husband verbalized understanding. Patient left in wheelchair with alarm on and all needs within reach. Continue with current plan of care.      Pain No reports of  pain  Therapy/Group: Individual Therapy  Barbee Mamula 06/17/2023, 1:09 PM

## 2023-06-18 ENCOUNTER — Ambulatory Visit: Payer: Medicare Other | Attending: Internal Medicine

## 2023-06-18 DIAGNOSIS — G939 Disorder of brain, unspecified: Secondary | ICD-10-CM

## 2023-06-18 DIAGNOSIS — M79604 Pain in right leg: Secondary | ICD-10-CM

## 2023-06-18 DIAGNOSIS — M79605 Pain in left leg: Secondary | ICD-10-CM

## 2023-06-18 LAB — GLUCOSE, CAPILLARY
Glucose-Capillary: 105 mg/dL — ABNORMAL HIGH (ref 70–99)
Glucose-Capillary: 96 mg/dL (ref 70–99)
Glucose-Capillary: 125 mg/dL — ABNORMAL HIGH (ref 70–99)
Glucose-Capillary: 138 mg/dL — ABNORMAL HIGH (ref 70–99)

## 2023-06-18 MED ORDER — METHOCARBAMOL 500 MG PO TABS: 500.0000 mg | ORAL_TABLET | Freq: Three times a day (TID) | ORAL | Status: DC | PRN

## 2023-06-18 NOTE — Progress Notes (Signed)
Occupational Therapy Session Note  Patient Details  Name: Virginia [name] MRN: [id number] Date of Birth: 1956/08/29  Today's Date: 06/18/2023 OT Individual Time: 4098-1191 OT Individual Time Calculation (min): 72 min    Short Term Goals: Week 2:  OT Short Term Goal 1 (Week 2): Pt will tolerate standing for 4 minutes within BADL task for functional endurance OT Short Term Goal 2 (Week 2): Patient will perform 1 step of toileting task OT Short Term Goal 3 (Week 2): Patient will don shirt with set-up A  Skilled Therapeutic Interventions/Progress Updates:    Pt greeted seated on commode with husband. Husband used Stedy to transfer pt to commode. Pt with successful BM and voided bladder. Pt needed Mod A for peri-care standing in [place].UB bathing/dressing completed perched on Stedy with min A. LB Dressing from wc at the sink with increased time and mod A with cues for hemi dressing strategies. Sit<>stands without Stedy with mostly min A, but one mod A due to fatigue. Pt brought to therapy gym/ ambulated 36 feet with RW and min A. Took seated rest break, then ambulated 42 feet w/ RW and min/CGA. L hand fine motor coordination with theraputty and small bead activity. Pt needed extended time and min cues to locate beads. OT trialed taped glasses to assist with double vision and dizziness, but pt did felt like glasses were not helping. Pt ambulated another 10 feet w/ RW and min A. Pt returned to room and left seated in wc with family present and needs met.   Therapy Documentation Precautions:  Precautions Precautions: Fall Restrictions Weight Bearing Restrictions: No Pain: Pain Assessment Pain Scale: 0-10 Pain Score: 8  Rest and repositioned   Therapy/Group: Individual Therapy  Mal Amabile 06/18/2023, 9:24 AM

## 2023-06-18 NOTE — Progress Notes (Signed)
PROGRESS NOTE   Subjective/Complaints: No events overnight.  No acute complaints. Vitals stable.  Pain scores consistently 7-8 out of 10. 2 medium bowel movements yesterday, continent Patient feeling well overall today; was dizzy on transferring but otherwise no issues. Therapies do note ongoing soreness in thighs; pt has had since extensive therapy session a few days ago. Improving overall.   ROS: Pertinent positives per HPI above.  Patient denies fever, rash, sore throat, blurred vision,  nausea, vomiting, diarrhea, cough, shortness of breath or chest pain, joint or back/neck pain, headache, or mood change.    Objective:   No results found. Recent Labs    06/15/23 1339 06/17/23 0715  WBC 13.3* 11.8*  HGB 7.8* 8.2*  HCT 23.2* 25.1*  PLT 310 262    Recent Labs    06/16/23 0743 06/17/23 0715  NA 139 139  K 3.2* 3.8  CL 103 104  CO2 28 23  GLUCOSE 99 99  BUN 26* 27*  CREATININE 1.17* 1.20*  CALCIUM 7.9* 8.1*     Intake/Output Summary (Last 24 hours) at 06/18/2023 2956 Last data filed at 06/18/2023 2130 Gross per 24 hour  Intake 720 ml  Output --  Net 720 ml     Pressure Injury 06/03/23 Buttocks Bilateral Deep Tissue Pressure Injury - Purple or maroon localized area of discolored intact skin or blood-filled blister due to damage of underlying soft tissue from pressure and/or shear. purple (Active)  06/03/23 1740  Location: Buttocks  Location Orientation: Bilateral  Staging: Deep Tissue Pressure Injury - Purple or maroon localized area of discolored intact skin or blood-filled blister due to damage of underlying soft tissue from pressure and/or shear.  Wound Description (Comments): purple  Present on Admission: Yes    Physical Exam: Vital Signs Blood pressure 106/78, pulse 81, temperature 98.2 F (36.8 C), resp. rate 16, weight 73.8 kg, SpO2 99%.  Constitutional: No distress . Vital signs reviewed.  Laying  in bed.  Husband at bedside. HEENT: NCAT, EOMI, oral membranes moist.  Cardiovascular: RRR without murmur. No JVD  . +2 dependent edema around thighs, buttocks.  Respiratory/Chest: CTA Bilaterally without wheezes or rales. Mild bilateral lower lobe crackles. Normal effort    GI/Abdomen: BS +, non-tender, non-distended Ext: no clubbing, cyanosis,   Psych: pleasant and cooperative  Skin: C/D/I. No apparent lesions.  Bilateral buttocks bruising as below. B- improving          Neurologic exam: alert and attentive Cognition: AAO to person, place, time  +Mild cognitive, memory deficits -mild delayed recall - unchanged Insight: reasonable  insight into current condition.  Mood: Pleasant affect, appropriate mood.  Strength: Antigravity against resistance bilateral upper and lower extremities, 4 out of 5 proximally and 5- out of 5 distally.  Unchanged. LUE ataxia -ongoing, mildly increased today but no other focal deficits   Assessment/Plan: 1. Functional deficits which require 3+ hours per day of interdisciplinary therapy in a comprehensive inpatient rehab setting. Physiatrist is providing close team supervision and 24 hour management of active medical problems listed below. Physiatrist and rehab team continue to assess barriers to discharge/monitor patient progress toward functional and medical goals  Care Tool:  Bathing  Body parts bathed by patient: Right arm, Left arm, Chest, Abdomen, Right upper leg, Left upper leg, Face   Body parts bathed by helper: Left lower leg, Right lower leg, Buttocks, Front perineal area     Bathing assist Assist Level: Moderate Assistance - Patient 50 - 74%     Upper Body Dressing/Undressing Upper body dressing   What is the patient wearing?: Pull over shirt    Upper body assist Assist Level: Minimal Assistance - Patient > 75%    Lower Body Dressing/Undressing Lower body dressing      What is the patient wearing?: Underwear/pull up      Lower body assist Assist for lower body dressing: Total Assistance - Patient < 25%     Toileting Toileting    Toileting assist Assist for toileting: Total Assistance - Patient < 25%     Transfers Chair/bed transfer  Transfers assist  Chair/bed transfer activity did not occur: Safety/medical concerns  Chair/bed transfer assist level: Moderate Assistance - Patient 50 - 74%     Locomotion Ambulation   Ambulation assist      Assist level: 2 helpers Assistive device: Walker-rolling Max distance: 5'   Walk 10 feet activity   Assist  Walk 10 feet activity did not occur: Safety/medical concerns        Walk 50 feet activity   Assist Walk 50 feet with 2 turns activity did not occur: Safety/medical concerns         Walk 150 feet activity   Assist Walk 150 feet activity did not occur: Safety/medical concerns         Walk 10 feet on uneven surface  activity   Assist Walk 10 feet on uneven surfaces activity did not occur: Safety/medical concerns         Wheelchair     Assist Is the patient using a wheelchair?: Yes Type of Wheelchair: Manual    Wheelchair assist level: Dependent - Patient 0%      Wheelchair 50 feet with 2 turns activity    Assist        Assist Level: Dependent - Patient 0%   Wheelchair 150 feet activity     Assist      Assist Level: Dependent - Patient 0%   Blood pressure 106/78, pulse 81, temperature 98.2 F (36.8 C), resp. rate 16, weight 73.8 kg, SpO2 99%.   Medical Problem List and Plan: 1. Functional deficits secondary to debility after cardiac arrest and multiple related complications             -patient may shower             -ELOS/Goals: 12-14 days, min assist with PT, OT (now CGA/SPV goals), SLP - discharge date 10/22             -pt has episodes of fatigue/somnolence at baseline per husband. He says she does better in the later morning and early afternoon.   -Continue CIR therapies including  PT, OT, and SLP     - 10/8: Patient self-limiting and complaining of soreness in her legs often; ?dizziness limiting. Walking will ask to sit down before she fatigues.   2.  Antithrombotics: -DVT/anticoagulation:  Mechanical:  Antiembolism stockings, knee (TED hose) Bilateral lower extremities (age indeterminate left posterior tib DVT). 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             -  antiplatelet therapy: none   3. Pain Management: Tylenol, Norco, oxycodone as needed -Lidoderm patch             -Oxycontin 10 mg q 12 hours             -observe for further sedation. Was somnolent this morning after receiving pain medication.   -10-3: Continue OxyContin 10 mg every 12 hours (is on 13.5 mg every 12 at home) just short acting to home regimen oxycodone 5 mg every 4 hours as needed     - 10/8: Add PRN methocarbamol for muscle soreness    10/5-6 pain appears controlled.  4. Mood/Behavior/Sleep: LCSW to evaluate and provide emotional support             -antipsychotic agents: Zyprexa 2.5 mg q HS-at home dose             -Xanax prn -at home dose   5. Neuropsych/cognition: This patient is capable of making decisions on his own behalf.   6. Skin/Wound Care: Routine skin care checks   -10-1: Family inquiring about protein supplementation, will look into supplements given on inpatient.   7. Fluids/Electrolytes/Nutrition: Routine Is and Os and follow-up chemistries   -10-1: See below, potassium borderline, magnesium repleted today.  Rechecking in AM.   - 10-2: Electrolytes repleted, labs normal, monitor  - 10/4: Encourage p.o. fluids due to tachycardia today; BMP in a.m.  -10/6 Potassium 3.2--increase supplement  -BUN/Cr holding fairly steady--continue to push fluids -10-7: Potassium 3.8.  Continue current supplement.  BUN/creatinine stable.  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  Will monitor for any functional decline, and may reach out to neuro-oncology for prolonged taper depending on symptom severity.  10/8: Symptoms stable   10: Bilateral rib fractures due to chest compressions -pain control as above   11: V-fib cardiac arrest:  -continue Pacerone 400 mg daily for one week, then 200 mg daily on 10/08             -not a candidate for invasive intervention             -K > 4; Mg > 2 -potassium 3.8 on admission labs, recheck in AM.  Magnesium 1.6, replete 2 g IV today and then start 400 mg twice daily due to recurrent drops.  Recheck in AM.             -if patient needs diuretics outpatient due to lower extremity edema then cardiology will consider changing to a potassium sparing diuretic such as spironolactone 25 mg daily along with some potassium supplementation and magnesium             -follow-up with Dr. 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 fluids, labs stable, f/u monday  10-7: Creatinine stabilizing at 1.1-1.2.  17: Anemia: ~3 gram drop in hemoglobin/no overt bleeding             -follow-up CBC. Most recently has been ~7.7; stable on admission labs             -check Hemoccult X 3 -pending             -has hemorrhoids, constipated             -continue PPI, Pepcid   - hgb stable at 7.8 on 10/5; greater than 8 10/7   18: DM: A1c = 7.3%, also on Decadron             -start CBGs QID and SSI             -carb modified diet as tolerated  Blood sugars well-controlled, monitor  -10-3: Mild elevation blood sugars over the last few days, likely with improved diet; not on any diabetic regimen as outpatient.  Monitor 1 day and consider initiating low-dose metformin if no improvement.   - 10-4: Patient working on dietary adjustments, mildly improved.  Continue to monitor Recent Labs    06/18/23 1158 06/18/23 1701 06/18/23 2227  GLUCAP 96 125* 138*    10/6 sugars have been elevated due to steroids--decadron down to 1mg  daily and readings improved except for readings around dinner time   -continue current cbg checks and SSI for now   -may be able to stop these soon 10-7 improved, monitor    19: Prior post fossa craniotomy for tumor resection 12/02/2020: resection of progressive cerebellar mass by Dr. Venetia Maxon. 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 Has chronic intermittent dizziness>>meclizine prn   - well controlled, no current complaints  -10/5 scheduled meclizine 25mg  tid per pt request - symptoms stable, intermittent  - Had prior rxn to scopalamine   20: External hemorrhoids: continue topical hydrocortisone 2.5%; reordered today, as suppository was prior  ordered.   21: Intermittent constipation: check for impaction then decide on appropriate regimen  10-1: Multiple small bowel movements overnight, add MiraLAX 17 mg daily  10-2: Small brown bowel movement this a.m., mushy consistency - improved  Last bowel movement 10/4--hopeful for BM today 10/6  Small bowel movement 10-7, increase MiraLAX to twice daily and add as needed Senokot per family request  10-8: 2 regular, soft bowel movements yesterday.      LOS: 8 days A FACE TO FACE EVALUATION WAS PERFORMED  Virginia [name] 06/18/2023, 9:23 AM

## 2023-06-18 NOTE — Progress Notes (Signed)
Patient ID: [name], female   DOB: November 21, 1955, 67 y.o.   MRN: [id number]  SW met with pt and pt husband in room to provide updates from team conference, and discharge date 10/22. Husband intends to look for a fully electric hospital similar to what is here in here in the hospital. He is aware he will continue to attend therapies with pt, and pt encouraged to do as much as possible for herself during these times. SW informed will continue to provide updates as available.   Cecile Sheerer, MSW, LCSWA Office: [phone] Cell: [phone] Fax: [phone]

## 2023-06-18 NOTE — Progress Notes (Signed)
Speech Language Pathology Weekly Progress and Session Note  Patient Details  Name: Virginia [name] MRN: [id number] Date of Birth: 10-18-1955  Beginning of progress report period: June 10, 2023 End of progress report period: June 18, 2023  Today's Date: 06/18/2023 SLP Individual Time: 1430-1500 SLP Individual Time Calculation (min): 30 min  Short Term Goals: Week 1: SLP Short Term Goal 1 (Week 1): Patient will solve basic environmental problems in 4/5 opportunities given modA. SLP Short Term Goal 1 - Progress (Week 1): Met SLP Short Term Goal 2 (Week 1): Patient will attend to the left during functional vision-based tasks (i.e. reading, drawing, etc.) given modA. SLP Short Term Goal 2 - Progress (Week 1): Met SLP Short Term Goal 3 (Week 1): Patient will utilize external memory and orientation aids to recall time, day of the week, and basic daily information given modA. SLP Short Term Goal 3 - Progress (Week 1): Met SLP Short Term Goal 4 (Week 1): Patient will demonstrate emergent awareness of problems during basic functional tasks in 4/5 opportunities given maxA. SLP Short Term Goal 4 - Progress (Week 1): Met SLP Short Term Goal 5 (Week 1): Patient will utilize speech intelligibility strategies at the sentence level with Supervision level verbal cues to achieve~90% intelligibility. SLP Short Term Goal 5 - Progress (Week 1): Met    New Short Term Goals: Week 2: SLP Short Term Goal 1 (Week 2): Patient will solve basic environmental problems in 4/5 opportunities given minA. SLP Short Term Goal 2 (Week 2): Patient will attend to the left during functional vision-based tasks (i.e. reading, drawing, etc.) given minA. SLP Short Term Goal 3 (Week 2): Patient will utilize external memory and orientation aids to recall time, day of the week, and basic daily information given minA. SLP Short Term Goal 4 (Week 2): Patient will demonstrate emergent awareness of problems during basic  functional tasks in 4/5 opportunities given modA. SLP Short Term Goal 5 (Week 2): Patient will utilize speech intelligibility strategies at the sentence level with Mod I to achieve~90% intelligibility.  Weekly Progress Updates: Patient has made functional gains and has met 5 of 5 STGs this reporting period. Currently, patient requires overall Min-Mod A multimodal cues to complete functional and familiar tasks safely in regards to visual scanning, problem solving, and awareness. Patient also requires overall supervision-Min A verbal cues for use of speech intelligibility strategies at the sentence level to achieve 90% intelligibility. Patient and family education ongoing. Patient would benefit from continued skilled SLP intervention to maximize her cognitive functioning and speech intelligibility prior to discharge.     Intensity: Minumum of 1-2 x/day, 30 to 90 minutes Frequency: 1 to 3 out of 7 days Duration/Length of Stay: 2 weeks Treatment/Interventions: Cognitive remediation/compensation;Environmental controls;Cueing hierarchy;Functional tasks;Therapeutic Activities;Internal/external aids;Patient/family education;Medication managment   Daily Session  Skilled Therapeutic Interventions:  Skilled treatment session focused on cognitive goals. SLP facilitated session by providing overall Mod A multimodal cues for functional problem solving during a mildly complex task in which patient had to interpret directions for medication administration and identify errors in a BID pill box. Min-Mod verbal and visual cues were also needed for left visual scanning throughout task with patient demonstrated decreased awareness regarding difficulty of task. Patient left upright in the wheelchair with her husband present. Continue with current plan of care.      Pain No/Denies Pain   Therapy/Group: Individual Therapy  Rodriguez Aguinaldo 06/18/2023, 3:14 PM

## 2023-06-18 NOTE — Progress Notes (Signed)
Physical Therapy Weekly Progress Note  Patient Details  Name: Virginia [name] MRN: [id number] Date of Birth: 1955/09/16  Beginning of progress report period: June 11, 2023 End of progress report period: June 18, 2023  Today's Date: 06/18/2023 PT Individual Time: 1100-1155 + [id number] PT Individual Time Calculation (min): 55 min + 41 min  Patient has met 4 of 4 short term goals. Pt is making slow progress towards functional goals. Pt currently requires min/mod assist for transfers depending on fatigue and pain. Pt currently ambulates up to 25' consistently with RW min assist. Pt progress has been limited due to fatigue, dizziness, and BLE soreness.  Patient continues to demonstrate the following deficits muscle weakness, decreased cardiorespiratoy endurance, impaired timing and sequencing, unbalanced muscle activation, and decreased coordination,  , decreased attention to left, central origin, and decreased sitting balance, decreased standing balance, decreased postural control, hemiplegia, and decreased balance strategies and therefore will continue to benefit from skilled PT intervention to increase functional independence with mobility.  Patient progressing toward long term goals..  Continue plan of care.  PT Short Term Goals Week 1:  PT Short Term Goal 1 (Week 1): Pt will ambulate 25' with RW min assist PT Short Term Goal 2 (Week 1): Pt will complete up/down 1 step with BUE support mod assist PT Short Term Goal 3 (Week 1): Pt will complete transfers with min assist and LRAD PT Short Term Goal 4 (Week 1): Pt will complete bed mobility with min assist  Skilled Therapeutic Interventions/Progress Updates:  SESSION 1: Pt presents in room in [facility], reporting some back pain and BLE soreness, premedicated. Pt continues to endorse dizziness, requesting to remain in room for session however agreeable to PT outside of room with encouragment. Session focused on therex for global strengthening and  endurance, NMR for dynamic standing balance and muscle fiber recruitment.  Pt transported to therapy gym dependently in [facility] for time management. Pt completes stand pivot transfer with RW min assist to nustep. Pt completes interval training on nustep BUE/BLE for total 12 min, 1 min work/1 min rest. Pt with improved LUE/LLE positioning during exercise, improved SPM ~60 throughout intervals.  Pt completes transfer back to [facility], transported to main gym and positioned in //bars. Pt completes side stepping in //bars 4x6' with seated rest break between 2nd and 3rd set. Pt then completes forward/backward ambulation in //bars, improved step length and speed for trial backwards walking, completed to promote multidirectional stepping stability. Pt completes forward/backward ambulation with RW 1x10' verbal cues for sequencing and increasing proximity to RW, min assist for task.  Pt returns to room in [facility] with pt husband at end of session.   SESSION 2: Pt presents in room in bed, agreeable to PT. Pt reporting continued pain, premedicated. Session focused on gait training for tolerance to upright, multidirectional stepping stability, and stair negotiation as well as bed mobility training.  Pt completes bed mobility with elevated HOB supervision using hospital bed rails. Pt completes transfer with RW min assist to WC. Pt transported in [facility] to main gym and positioned in front of stairs.  Pt completes up/down 1 step with BHRs x5 reps ascending with RLE, descending with LLE, min assist for task with pt demonstrating decreased terminal hip extension with RLE for task but no LOB.   Pt transported to day room dependently in WC. Pt ambulates 28' with RW min assist around cone, no WC follow, with slight L path deviation with RW able to correct with cues requiring only min  assist during task.  Pt then completes forward/backward ambulation with RW 2x10' with verbal/visual cues for sequencing with pt demonstrating improved  postural stability, min assist for task.  Pt returns to room and remains seated in [facility] with all needs within reach, cal light in place and pt husband present at end of session.   Ambulation/gait training;Discharge planning;Functional mobility training;Psychosocial support;Therapeutic Activities;Visual/perceptual remediation/compensation;Balance/vestibular training;Disease management/prevention;Neuromuscular re-education;Therapeutic Exercise;Skin care/wound management;Wheelchair propulsion/positioning;Cognitive remediation/compensation;DME/adaptive equipment instruction;Pain management;Splinting/orthotics;UE/LE Strength taining/ROM;Community reintegration;Patient/family education;Stair training;UE/LE Coordination activities   Therapy Documentation Precautions:  Precautions Precautions: Fall Restrictions Weight Bearing Restrictions: No   Therapy/Group: Individual Therapy  Edwin Cap PT, DPT 06/18/2023, 12:44 PM

## 2023-06-18 NOTE — Progress Notes (Incomplete Revision)
PROGRESS NOTE   Subjective/Complaints: No events overnight.  No acute complaints. Vitals stable.  Pain scores consistently 7-8 out of 10. 2 medium bowel movements yesterday, continent Patient feeling well overall today; was dizzy on transferring but otherwise no issues. Therapies do note ongoing soreness in thighs; pt has had since extensive therapy session a few days ago. Improving overall.   ROS: Pertinent positives per HPI above.  Patient denies fever, rash, sore throat, blurred vision,  nausea, vomiting, diarrhea, cough, shortness of breath or chest pain, joint or back/neck pain, headache, or mood change.    Objective:   No results found. Recent Labs    06/15/23 1339 06/17/23 0715  WBC 13.3* 11.8*  HGB 7.8* 8.2*  HCT 23.2* 25.1*  PLT 310 262    Recent Labs    06/16/23 0743 06/17/23 0715  NA 139 139  K 3.2* 3.8  CL 103 104  CO2 28 23  GLUCOSE 99 99  BUN 26* 27*  CREATININE 1.17* 1.20*  CALCIUM 7.9* 8.1*     Intake/Output Summary (Last 24 hours) at 06/18/2023 4098 Last data filed at 06/18/2023 1191 Gross per 24 hour  Intake 720 ml  Output --  Net 720 ml     Pressure Injury 06/03/23 Buttocks Bilateral Deep Tissue Pressure Injury - Purple or maroon localized area of discolored intact skin or blood-filled blister due to damage of underlying soft tissue from pressure and/or shear. purple (Active)  06/03/23 1740  Location: Buttocks  Location Orientation: Bilateral  Staging: Deep Tissue Pressure Injury - Purple or maroon localized area of discolored intact skin or blood-filled blister due to damage of underlying soft tissue from pressure and/or shear.  Wound Description (Comments): purple  Present on Admission: Yes    Physical Exam: Vital Signs Blood pressure 106/78, pulse 81, temperature 98.2 F (36.8 C), resp. rate 16, weight 73.8 kg, SpO2 99%.  Constitutional: No distress . Vital signs reviewed.  Laying  in bed.  Husband at bedside. HEENT: NCAT, EOMI, oral membranes moist.  Cardiovascular: RRR without murmur. No JVD  . +2 dependent edema around thighs, buttocks.  Respiratory/Chest: CTA Bilaterally without wheezes or rales. Mild bilateral lower lobe crackles. Normal effort    GI/Abdomen: BS +, non-tender, non-distended Ext: no clubbing, cyanosis,   Psych: pleasant and cooperative  Skin: C/D/I. No apparent lesions.  Bilateral buttocks bruising as below. B- improving          Neurologic exam: alert and attentive Cognition: AAO to person, place, time  +Mild cognitive, memory deficits -mild delayed recall - unchanged Insight: reasonable  insight into current condition.  Mood: Pleasant affect, appropriate mood.  Strength: Antigravity against resistance bilateral upper and lower extremities, 4 out of 5 proximally and 5- out of 5 distally.  Unchanged. LUE ataxia -ongoing, mildly increased today but no other focal deficits   Assessment/Plan: 1. Functional deficits which require 3+ hours per day of interdisciplinary therapy in a comprehensive inpatient rehab setting. Physiatrist is providing close team supervision and 24 hour management of active medical problems listed below. Physiatrist and rehab team continue to assess barriers to discharge/monitor patient progress toward functional and medical goals  Care Tool:  Bathing  Body parts bathed by patient: Right arm, Left arm, Chest, Abdomen, Right upper leg, Left upper leg, Face   Body parts bathed by helper: Left lower leg, Right lower leg, Buttocks, Front perineal area     Bathing assist Assist Level: Moderate Assistance - Patient 50 - 74%     Upper Body Dressing/Undressing Upper body dressing   What is the patient wearing?: Pull over shirt    Upper body assist Assist Level: Minimal Assistance - Patient > 75%    Lower Body Dressing/Undressing Lower body dressing      What is the patient wearing?: Underwear/pull up      Lower body assist Assist for lower body dressing: Total Assistance - Patient < 25%     Toileting Toileting    Toileting assist Assist for toileting: Total Assistance - Patient < 25%     Transfers Chair/bed transfer  Transfers assist  Chair/bed transfer activity did not occur: Safety/medical concerns  Chair/bed transfer assist level: Moderate Assistance - Patient 50 - 74%     Locomotion Ambulation   Ambulation assist      Assist level: 2 helpers Assistive device: Walker-rolling Max distance: 5'   Walk 10 feet activity   Assist  Walk 10 feet activity did not occur: Safety/medical concerns        Walk 50 feet activity   Assist Walk 50 feet with 2 turns activity did not occur: Safety/medical concerns         Walk 150 feet activity   Assist Walk 150 feet activity did not occur: Safety/medical concerns         Walk 10 feet on uneven surface  activity   Assist Walk 10 feet on uneven surfaces activity did not occur: Safety/medical concerns         Wheelchair     Assist Is the patient using a wheelchair?: Yes Type of Wheelchair: Manual    Wheelchair assist level: Dependent - Patient 0%      Wheelchair 50 feet with 2 turns activity    Assist        Assist Level: Dependent - Patient 0%   Wheelchair 150 feet activity     Assist      Assist Level: Dependent - Patient 0%   Blood pressure 106/78, pulse 81, temperature 98.2 F (36.8 C), resp. rate 16, weight 73.8 kg, SpO2 99%.   Medical Problem List and Plan: 1. Functional deficits secondary to debility after cardiac arrest and multiple related complications             -patient may shower             -ELOS/Goals: 12-14 days, min assist with PT, OT (now CGA/SPV goals), SLP - discharge date 10/22             -pt has episodes of fatigue/somnolence at baseline per husband. He says she does better in the later morning and early afternoon.   -Continue CIR therapies including  PT, OT, and SLP     - 10/8: Patient self-limiting and complaining of soreness in her legs often; ?dizziness limiting. Walking will ask to sit down before she fatigues.   2.  Antithrombotics: -DVT/anticoagulation:  Mechanical:  Antiembolism stockings, knee (TED hose) Bilateral lower extremities (age indeterminate left posterior tib DVT). 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             -  antiplatelet therapy: none   3. Pain Management: Tylenol, Norco, oxycodone as needed -Lidoderm patch             -Oxycontin 10 mg q 12 hours             -observe for further sedation. Was somnolent this morning after receiving pain medication.   -10-3: Continue OxyContin 10 mg every 12 hours (is on 13.5 mg every 12 at home) just short acting to home regimen oxycodone 5 mg every 4 hours as needed     - 10/8: Add PRN methocarbamol for muscle soreness    10/5-6 pain appears controlled.  4. Mood/Behavior/Sleep: LCSW to evaluate and provide emotional support             -antipsychotic agents: Zyprexa 2.5 mg q HS-at home dose             -Xanax prn -at home dose   5. Neuropsych/cognition: This patient is capable of making decisions on his own behalf.   6. Skin/Wound Care: Routine skin care checks   -10-1: Family inquiring about protein supplementation, will look into supplements given on inpatient.   7. Fluids/Electrolytes/Nutrition: Routine Is and Os and follow-up chemistries   -10-1: See below, potassium borderline, magnesium repleted today.  Rechecking in AM.   - 10-2: Electrolytes repleted, labs normal, monitor  - 10/4: Encourage p.o. fluids due to tachycardia today; BMP in a.m.  -10/6 Potassium 3.2--increase supplement  -BUN/Cr holding fairly steady--continue to push fluids -10-7: Potassium 3.8.  Continue current supplement.  BUN/creatinine stable.  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  Will monitor for any functional decline, and may reach out to neuro-oncology for prolonged taper depending on symptom severity.  10/8: Symptoms stable   10: Bilateral rib fractures due to chest compressions -pain control as above   11: V-fib cardiac arrest:  -continue Pacerone 400 mg daily for one week, then 200 mg daily on 10/08             -not a candidate for invasive intervention             -K > 4; Mg > 2 -potassium 3.8 on admission labs, recheck in AM.  Magnesium 1.6, replete 2 g IV today and then start 400 mg twice daily due to recurrent drops.  Recheck in AM.             -if patient needs diuretics outpatient due to lower extremity edema then cardiology will consider changing to a potassium sparing diuretic such as spironolactone 25 mg daily along with some potassium supplementation and magnesium             -follow-up with Dr. Lynnette Caffey 3 weeks after D/C from CIR    - Daily weights  -10/6 supplement K+  - 10/8: Weights likely inaccurate; mild dependent edema. 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 fluids, labs stable, f/u monday  10-7: Creatinine stabilizing at 1.1-1.2.  17: Anemia: ~3 gram drop in hemoglobin/no overt bleeding             -follow-up CBC. Most recently has been ~7.7; stable on admission labs             -check Hemoccult X 3 -pending             -has hemorrhoids, constipated             -continue PPI, Pepcid   - hgb stable at 7.8 on 10/5; greater than 8 10/7   18: DM: A1c = 7.3%, also on Decadron             -start CBGs QID and SSI             -carb modified diet as tolerated  Blood sugars well-controlled, monitor  -10-3: Mild elevation blood sugars over the last few days, likely with improved diet; not on any diabetic regimen as outpatient.  Monitor 1 day and consider initiating low-dose metformin if no improvement.   - 10-4: Patient working on dietary adjustments, mildly improved.  Continue to monitor Recent Labs    06/18/23 1158 06/18/23 1701 06/18/23 2227  GLUCAP 96 125* 138*    10/6 sugars have been elevated due to steroids--decadron down to 1mg  daily and readings improved except for readings around dinner time   -continue current cbg checks and SSI for now   -may be able to stop these soon 10-7 improved, monitor    19: Prior post fossa craniotomy for tumor resection 12/02/2020: resection of progressive cerebellar mass by Dr. Venetia Maxon. 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 Has chronic intermittent dizziness>>meclizine prn   - well controlled, no current complaints  -10/5 scheduled meclizine 25mg  tid per pt request - symptoms stable,  intermittent  - Had prior rxn to scopalamine   20: External hemorrhoids: continue topical hydrocortisone 2.5%; reordered today, as suppository was prior ordered.   21: Intermittent constipation: check for impaction then decide on appropriate regimen  10-1: Multiple small bowel movements overnight, add MiraLAX 17 mg daily  10-2: Small brown bowel movement this a.m., mushy consistency - improved  Last bowel movement 10/4--hopeful for BM today 10/6  Small bowel movement 10-7, increase MiraLAX to twice daily and add as needed Senokot per family  request  10-8: 2 regular, soft bowel movements yesterday.      LOS: 8 days A FACE TO FACE EVALUATION WAS PERFORMED  Angelina Sheriff 06/18/2023, 9:23 AM

## 2023-06-18 NOTE — Patient Care Conference (Signed)
Inpatient RehabilitationTeam Conference and Plan of Care Update Date: 06/18/2023   Time: 1033 am    Patient Name: Virginia [name]      Medical Record Number: [id number]  Date of Birth: Jan 29, 1956 Sex: Female         Room/Bed: 4W23C/4W23C-01 Payor Info: Payor: MEDICARE / Plan: MEDICARE PART A AND B / Product Type: *No Product type* /    Admit Date/Time:  06/10/2023  5:08 PM  Primary Diagnosis:  Cardiac arrest [facility])  Hospital Problems: Principal Problem:   Cardiac arrest [facility]) Active Problems:   Lung cancer metastatic to brain (HCC)   Debility   History of DVT (deep vein thrombosis)   Urine retention   Acute blood loss anemia   History of ventricular fibrillation    Expected Discharge Date: Expected Discharge Date: 07/02/23  Team Members Present: Physician leading conference: Dr. Elijah Birk Social Worker Present: Cecile Sheerer, LCSWA Nurse Present: Other (comment) (Zev Blue Shirlee Latch RN) PT Present: Darrold Span, PT OT Present: Kearney Hard, OT SLP Present: Feliberto Gottron, SLP PPS Coordinator present : Fae Pippin, SLP     Current Status/Progress Goal Weekly Team Focus  Bowel/Bladder   patient is continent of b/b. has some hemorroids which cause some discomfort   maintain continence   offer toileting q2-4 hrs and soothe hemorroids with cream. encourage fiber and fluids.    Swallow/Nutrition/ Hydration               ADL's   Min/mod A overall   CGA/supervision   self-care retraining, pain and fatigue are her biggest barriers, good family support    Mobility   min/modA transfers, gait up to 11' with min/modA with WC   CGA short distance ambulation  barriers: dizziness, fatigue; continue focus on endurance, gait training, dynamic standing balance    Communication   Supervision   Mod I   use of speech intelligibility strategies at the sentence level    Safety/Cognition/ Behavioral Observations  Supervision-Min A   Min A   functional recall,  emergent awareness, basic problem solving    Pain   patient c/o back pain with 6-7/10 on pain scale. patient's pain is controlled with PRN oxy 5mg  for breakthrough pain   continue to control pain   assess pain qshift and PRN address pain before it is out of control    Skin   patient skin is intact with DTI on bilateral buttocks   maintain skin integrity  assess skin qshift and PRN continuing q2hr turns      Discharge Planning:  D/c plan is for pt to discharge to home with husband who was assisting prior to admission. Has been staying here with her since admission. SW will confirm there are no barriers to discharge.   Team Discussion: Post cardiac arrest with metastatic brain lesion; ataxia; cognition deficits vertigo and dizziness. Per MD vital signs and labs WNL.Progress limited by pain and fatigue.  Patient on target to meet rehab goals: yes, currently making slow progress.needs min-mod assist with ADLs and transfers. Able to ambulate up to 25 ft with self limiting behaviors noted vs tolerance. Working on Systems developer strategies and voice intensity.goals for discharge set at contact guard overall.  *See Care Plan and progress notes for long and short-term goals.   Revisions to Treatment Plan:  Visual perceptual interventions; taping of glasses   Teaching Needs: Safety, medications, transfers,toileting,etc.   Current Barriers to Discharge: N/a  Possible Resolutions to Barriers: Family education Home health follow up services  Followup in Cards office with Dr. Lynnette Caffey in 3 weeks after finishing in patient rehab      Medical Summary Current Status: medically complicated by hypertension, poor pain control, constipation, dizziness/vertigo, ataxia, hyperglycemia on steroid taper, cognitive deficits, and buttocks DTI  Barriers to Discharge: Cardiac Complications;Complicated Wound;Medical stability;Uncontrolled Diabetes;Uncontrolled Hypertension;Uncontrolled  Pain;Self-care education;Other (comments)  Barriers to Discharge Comments: Dizziness/vertigo Possible Resolutions to [employer] Focus: titrate pain medications to minimum tolerated doses for function, titrate bowel medications, monitor vitals and increase BP regimen as approrpiate, monitor labs and glucose with Decadron taper   Continued Need for Acute Rehabilitation Level of Care: The patient requires daily medical management by a physician with specialized training in physical medicine and rehabilitation for the following reasons: Direction of a multidisciplinary physical rehabilitation program to maximize functional independence : Yes Medical management of patient stability for increased activity during participation in an intensive rehabilitation regime.: Yes Analysis of laboratory values and/or radiology reports with any subsequent need for medication adjustment and/or medical intervention. : Yes   I attest that I was present, lead the team conference, and concur with the assessment and plan of the team.   Gwenyth Allegra 06/18/2023, 3:42 PM

## 2023-06-19 DIAGNOSIS — I5021 Acute systolic (congestive) heart failure: Secondary | ICD-10-CM

## 2023-06-19 DIAGNOSIS — R601 Generalized edema: Secondary | ICD-10-CM

## 2023-06-19 LAB — BASIC METABOLIC PANEL
Anion gap: 8 (ref 5–15)
BUN: 22 mg/dL (ref 8–23)
CO2: 26 mmol/L (ref 22–32)
Calcium: 7.9 mg/dL — ABNORMAL LOW (ref 8.9–10.3)
Chloride: 108 mmol/L (ref 98–111)
Creatinine, Ser: 1.19 mg/dL — ABNORMAL HIGH (ref 0.44–1.00)
GFR, Estimated: 51 mL/min — ABNORMAL LOW (ref >60)
Glucose, Bld: 95 mg/dL (ref 70–99)
Potassium: 3.8 mmol/L (ref 3.5–5.1)
Sodium: 142 mmol/L (ref 135–145)

## 2023-06-19 LAB — GLUCOSE, CAPILLARY
Glucose-Capillary: 96 mg/dL (ref 70–99)
Glucose-Capillary: 139 mg/dL — ABNORMAL HIGH (ref 70–99)
Glucose-Capillary: 122 mg/dL — ABNORMAL HIGH (ref 70–99)
Glucose-Capillary: 179 mg/dL — ABNORMAL HIGH (ref 70–99)

## 2023-06-19 LAB — MAGNESIUM: Magnesium: 1.8 mg/dL (ref 1.7–2.4)

## 2023-06-19 MED ORDER — SPIRONOLACTONE 25 MG PO TABS
25.0000 mg | ORAL_TABLET | Freq: Every day | ORAL | Status: DC
  Administered 2023-06-19 – 2023-06-22 (×4): 25 mg via ORAL
  Filled 2023-06-19 (×5): qty 1

## 2023-06-19 NOTE — Progress Notes (Signed)
DAILY PROGRESS NOTE   Patient Name: Virginia [name] Date of Encounter: 06/19/2023 Cardiologist: None  Chief Complaint   Edema  Patient Profile   Virginia [name] is a 67 y.o. 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 Dolphus Jenny.   Subjective   Asked to see patient again today regarding edema. She has noticed more LE and UE edema- previously she was on lasix 40 mg BID prior to admission, but had significant electrolyte abnormalities related to this, including low potassium and magnesium. She was also thought to be malnourished as a result of her cancer. Of note, her albumin was only 2.1 on 9/30. I's and O's and daily weights are not being tracked - howe er, she is recorded +2L.  Objective   Vitals:   06/18/23 0429 06/18/23 1617 06/18/23 2020 06/19/23 0548  BP: 106/78 (!) 144/87 110/82 139/80  Pulse: 81 84 80 75  Resp: 16 19 16 16   Temp: 98.2 F (36.8 C) 97.9 F (36.6 C) 98.3 F (36.8 C) 98.3 F (36.8 C)  TempSrc:  Oral Oral   SpO2: 99% 100% 99% 98%  Weight:        Intake/Output Summary (Last 24 hours) at 06/19/2023 1151 Last data filed at 06/19/2023 1146 Gross per 24 hour  Intake 1180 ml  Output --  Net 1180 ml   Filed Weights   06/10/23 1735 06/15/23 0316 06/16/23 0709  Weight: 68.7 kg 69.8 kg 73.8 kg    Physical Exam   General appearance: alert and no distress Extremities: edema 1+ bilateral LE (Stockings in place) and trace to 1+ bilateral UE edema  Inpatient Medications    Scheduled Meds:  (feeding supplement) PROSource Plus  30 mL Oral BID BM   amiodarone  200 mg Oral Daily   dexamethasone  1 mg Oral Daily   feeding supplement  237 mL  Oral BID BM   insulin aspart  0-9 Units Subcutaneous TID WC   levETIRAcetam  1,000 mg Oral BID   lidocaine  1 patch Transdermal Q24H   magnesium oxide  400 mg Oral BID   meclizine  25 mg Oral TID   multivitamin with minerals  1 tablet Oral Daily   OLANZapine  2.5 mg Oral QHS   oxyCODONE  10 mg Oral Q12H   pantoprazole  40 mg Oral Daily   polyethylene glycol  17 g Oral BID   potassium chloride  20 mEq Oral BID    Continuous Infusions:   PRN Meds: acetaminophen, ALPRAZolam, alum & mag hydroxide-simeth, bisacodyl, diphenhydrAMINE, guaiFENesin-dextromethorphan, hydrocortisone, Bladder scan **AND** [COMPLETED] In and Out Cath **AND** lidocaine, methocarbamol, ondansetron **OR** ondansetron (ZOFRAN) IV, oxyCODONE, senna-docusate, sodium phosphate   Labs   Results for orders placed or performed during the hospital encounter of 06/10/23 (from the past 48 hour(s))  Glucose, capillary     Status: Abnormal   Collection Time: 06/17/23 12:05 PM  Result Value Ref Range   Glucose-Capillary 122 (H) 70 - 99 mg/dL    Comment: Glucose reference range applies only to samples taken after fasting for at least 8 hours.  Glucose, capillary     Status: Abnormal   Collection Time: 06/17/23  4:44 PM  Result Value Ref Range   Glucose-Capillary 155 (H) 70 - 99 mg/dL    Comment: Glucose reference range applies only to samples taken after fasting for at least 8 hours.  Glucose, capillary     Status: Abnormal   Collection Time: 06/17/23  8:55 PM  Result Value Ref Range   Glucose-Capillary 140 (H) 70 - 99 mg/dL    Comment: Glucose reference range applies only to samples taken after fasting for at least 8 hours.  Glucose, capillary     Status: Abnormal   Collection Time: 06/18/23  5:50 AM  Result Value Ref Range   Glucose-Capillary 105 (H) 70 - 99 mg/dL    Comment: Glucose reference range applies only to samples taken after fasting for at least 8 hours.  Glucose, capillary     Status: None   Collection  Time: 06/18/23 11:58 AM  Result Value Ref Range   Glucose-Capillary 96 70 - 99 mg/dL    Comment: Glucose reference range applies only to samples taken after fasting for at least 8 hours.  Glucose, capillary     Status: Abnormal   Collection Time: 06/18/23  5:01 PM  Result Value Ref Range   Glucose-Capillary 125 (H) 70 - 99 mg/dL    Comment: Glucose reference range applies only to samples taken after fasting for at least 8 hours.  Glucose, capillary     Status: Abnormal   Collection Time: 06/18/23 10:27 PM  Result Value Ref Range   Glucose-Capillary 138 (H) 70 - 99 mg/dL    Comment: Glucose reference range applies only to samples taken after fasting for at least 8 hours.  Basic metabolic panel     Status: Abnormal   Collection Time: 06/19/23  4:58 AM  Result Value Ref Range   Sodium 142 135 - 145 mmol/L   Potassium 3.8 3.5 - 5.1 mmol/L   Chloride 108 98 - 111 mmol/L   CO2 26 22 - 32 mmol/L   Glucose, Bld 95 70 - 99 mg/dL    Comment: Glucose reference range applies only to samples taken after fasting for at least 8 hours.   BUN 22 8 - 23 mg/dL   Creatinine, Ser 1.61 (H) 0.44 - 1.00 mg/dL   Calcium 7.9 (L) 8.9 - 10.3 mg/dL   GFR, Estimated 51 (L) >60 mL/min    Comment: (NOTE) Calculated using the CKD-EPI Creatinine Equation (2021)    Anion gap 8 5 - 15    Comment: Performed at [facility], [address], [place], [place] [address]  Magnesium     Status: None   Collection Time: 06/19/23  4:58 AM  Result Value Ref Range   Magnesium 1.8 1.7 - 2.4 mg/dL    Comment: Performed at [facility], [address], [place], [place] [address]  Glucose, capillary     Status: None   Collection Time: 06/19/23  7:28 AM  Result Value Ref Range   Glucose-Capillary 96 70 - 99 mg/dL    Comment: Glucose reference range applies only to samples taken after fasting for at least 8 hours.  Glucose, capillary     Status: Abnormal   Collection Time: 06/19/23 11:41 AM  Result Value Ref Range    Glucose-Capillary 139 (H) 70 - 99 mg/dL    Comment: Glucose reference range applies only to samples taken after fasting for at least 8 hours.   *Note: Due to a large number of results and/or encounters for the requested time  period, some results have not been displayed. A complete set of results can be found in Results Review.    ECG   N/A  Telemetry   N/A  Radiology    No results found.  Cardiac Studies   N/A  Assessment   Principal Problem:   Cardiac arrest [facility]) Active Problems:   Lung cancer metastatic to brain [facility])   Debility   History of DVT (deep vein thrombosis)   Urine retention   Acute blood loss anemia   History of ventricular fibrillation   Plan   Agree with plan suggested by Dr. Mayford Knife on 9/30. Start aldactone 25 mg daily -monitor potassium. Follow weights, urine output and edema on exam to demonstrate improvement. May ultimately need a low dose loop diuretic. While there is mild LV dysfunction on echo, I suspect more of the edema is related to third-spacing, contributed to be hypoalbuminemia and protein-calorie malnutrition. Would continue to work on improving fed-state.  No further suggestions at this time. Follow-up has been arranged with Tereso Newcomer, [name] on 10/22.  [facility] will sign off.   Medication Recommendations:  as above Other recommendations (labs, testing, etc):  none Follow up as an outpatient:  Dr. Trula Ore PA - Tereso Newcomer on 10/22   Time Spent Directly with Patient:  I have spent a total of 25 minutes with the patient reviewing hospital notes, telemetry, EKGs, labs and examining the patient as well as establishing an assessment and plan that was discussed personally with the patient.  > 50% of time was spent in direct patient care.  Length of Stay:  LOS: 9 days   Chrystie Nose, MD, [facility], FACP  [place]  [facility]  Medical Director of the [facility] Lipid Disorders &  Cardiovascular Risk Reduction  Clinic Diplomate of the American Board of Clinical Lipidology Attending Cardiologist  Direct Dial: [phone]  Fax: [phone]  Website:  [email or web address] Nicely Rosalea Withrow 06/19/2023, 11:51 AM

## 2023-06-19 NOTE — Progress Notes (Addendum)
Physical Therapy Session Note  Patient Details  Name: Virginia [name] MRN: [id number] Date of Birth: 1956-08-26  Today's Date: 06/19/2023 PT Individual Time: 1015-1115 PT Individual Time Calculation (min): 60 min   Short Term Goals: Week 2:  PT Short Term Goal 1 (Week 2): Pt will ambulate with RW min assist 50' PT Short Term Goal 2 (Week 2): Pt will complete up/down 4 steps with BHRs min assist PT Short Term Goal 3 (Week 2): Pt will complete bed mobility with CGA PT Short Term Goal 4 (Week 2): Pt will complete transfers consistently CGA with LRAD  Skilled Therapeutic Interventions/Progress Updates:    Pt presents in room, handoff from OT. Pt reporting pain in back, RN notified and medicates during session. Session focused on gait training, therapeutic exercise for BLE strengthening and muscle fiber recruitment needed for functional transfers, ambulation, and stair negotiation, and therapeutic activity with education on DME for home use as well as breathing mechanics.  Pt transported to therapy gym in [facility] dependently for time management and energy conservation. Pt ambulates with RW CGA/min assist 25' demonstrating improved RW mgmt during gait with min verbal cues and improved proximity to RW. Pt then completes forward/backward ambulation 2x10' with RW min assist for backwards ambulation initially but progress to mod assist with fatigue with x1 LOB posteriorly into WC. Pt requires mod/max verbal cues for sequencing.  Pt transported to main gym and completes standing therex, step ups 6" step RLE x10 reps, completes step ups with LLE on 3" step x10 reps with BHRs, min assist for postural stability, completed to promote BLE strengthening needed for stair negotiation and functional ambulation, no buckling noted.  Pt completes standing marches x20 with BLE in //bars, requires increased time to get to standing with pt reporting increased dizziness requesting to sit. Pt provided on difference between  vestibular dizziness and orthostatic dizziness with pt able to describe sensation as "lightheadedness" with standing and "spinning" at baseline. Pt reports lightheadedness subsides upon sitting. Pt educated on importance of breathing during sit<>stand and exercises to decrease valsalva response with pt verbalizing and demonstrating understanding. Therapist educates pt and pt husband on recommended DME for home as pt and pt husband requesting Roho cushion for home use, educated on benefits of Roho cushion for skin protection and wound healing.  Pt returns to room and remains seated in [facility] with all needs within reach, cal light in place and husband present at end of session.   Therapy Documentation Precautions:  Precautions Precautions: Fall Restrictions Weight Bearing Restrictions: No  Therapy/Group: Individual Therapy  Edwin Cap PT, DPT 06/19/2023, 11:38 AM

## 2023-06-19 NOTE — Progress Notes (Signed)
Physical Therapy Session Note  Patient Details  Name: Virginia [name] MRN: [id number] Date of Birth: 07-22-1956  Today's Date: 06/19/2023 PT Individual Time: 1305-1330 PT Individual Time Calculation (min): 25 min   Short Term Goals: Week 2:  PT Short Term Goal 1 (Week 2): Pt will ambulate with RW min assist 50' PT Short Term Goal 2 (Week 2): Pt will complete up/down 4 steps with BHRs min assist PT Short Term Goal 3 (Week 2): Pt will complete bed mobility with CGA PT Short Term Goal 4 (Week 2): Pt will complete transfers consistently CGA with LRAD  Skilled Therapeutic Interventions/Progress Updates:     Pt received seated in [facility] and agrees to therapy. No complaint of pain. WC transport to gym. MinA/modA for stand step transfer from [facility] to mat table, with cues for initiation, sequencing, and positioning. Pt performs sit to stand from mat with minA and cues for body mechanics. In standing and with Lt HHA, pt completes x10 Rt leg lifts and x10 Lt leg lifts with facilitation of lateral weight shifting and tactile cueing at Lt knee to promote extension. Following rest break, pt stands and ambulates x25' with Lt HHA and modA, with cues for upright posture, trunk extension and hip extension for improved stability and body mechanics, lateral weight shifting, and progression of reciprocal gait pattern. WC transport back to room. Left seated with all needs within reach.   Therapy Documentation Precautions:  Precautions Precautions: Fall Restrictions Weight Bearing Restrictions: No   Therapy/Group: Individual Therapy  Beau [name], PT, DPT 06/19/2023, 4:26 PM

## 2023-06-19 NOTE — Progress Notes (Signed)
Occupational Therapy Session Note  Patient Details  Name: Virginia [name] MRN: [id number] Date of Birth: 12/13/1955  Today's Date: 06/19/2023 OT Individual Time: 0865-7846 OT Individual Time Calculation (min): 60 min    Short Term Goals: Week 2:  OT Short Term Goal 1 (Week 2): Pt will tolerate standing for 4 minutes within BADL task for functional endurance OT Short Term Goal 2 (Week 2): Patient will perform 1 step of toileting task OT Short Term Goal 3 (Week 2): Patient will don shirt with set-up A  Skilled Therapeutic Interventions/Progress Updates:   Pt seen for am self care retraining session with strong focus on bathing, dressing, toileting and use of AE with husband present for training. Pt requesting shower but has 3 other long PT sessions. OT open for attempting to get in full toileting and shower in 1 hr slot if STEDY was used for transfers. Standing facilitation with grab bar and RW conducted throughout for progression of mobility and standing tolerance. L UE graded control addressed. Integrated use of reacher and sock aide with mod A for LB garment management. Handoff to PT for next session.   Pain:  Denies pain and removed ktape off L shoulder   Therapy Documentation Precautions:  Precautions Precautions: Fall Restrictions Weight Bearing Restrictions: No  Vicenta Dunning 06/19/2023, 7:41 AM

## 2023-06-19 NOTE — Progress Notes (Signed)
Speech Language Pathology Daily Session Note  Patient Details  Name: Virginia [name] MRN: [id number] Date of Birth: November 23, 1955  Today's Date: 06/19/2023 SLP Individual Time: 1435-1505 SLP Individual Time Calculation (min): 30 min  Short Term Goals: Week 2: SLP Short Term Goal 1 (Week 2): Patient will solve basic environmental problems in 4/5 opportunities given minA. SLP Short Term Goal 2 (Week 2): Patient will attend to the left during functional vision-based tasks (i.e. reading, drawing, etc.) given minA. SLP Short Term Goal 3 (Week 2): Patient will utilize external memory and orientation aids to recall time, day of the week, and basic daily information given minA. SLP Short Term Goal 4 (Week 2): Patient will demonstrate emergent awareness of problems during basic functional tasks in 4/5 opportunities given modA. SLP Short Term Goal 5 (Week 2): Patient will utilize speech intelligibility strategies at the sentence level with Mod I to achieve~90% intelligibility.  Skilled Therapeutic Interventions: Skilled treatment session focused on communication goals.  SLP facilitated session by providing overall supervision level verbal cues for use of speech intelligibility strategies during an informal conversation to achieve ~90% intelligibility. Patient recalled events from previous therapy sessions with extra time and overall supervision level verbal cues. Patient reporting intermittent vomiting after pills and SLP suspects due to esophageal dysmotility based on patient's description of symptoms. SLP provided education regarding strategies such as alternating solids/liquids, consuming meals/pills sitting up 90 degrees, remaining upright 30-45 minutes after meals, and eating several smaller meals throughout the day. The patient and her husband verbalized understanding. Patient left upright in wheelchair with all needs within reach. Continue with current plan of care.     Pain No/Denies Pain    Therapy/Group: Individual Therapy  Tarra Pence 06/19/2023, 3:19 PM

## 2023-06-19 NOTE — Progress Notes (Signed)
Physical Therapy Session Note  Patient Details  Name: Virginia [name] MRN: [id number] Date of Birth: 1955-11-27  Today's Date: 06/19/2023 PT Individual Time: 4098-1191 PT Individual Time Calculation (min): 28 min   Short Term Goals: Week 2:  PT Short Term Goal 1 (Week 2): Pt will ambulate with RW min assist 50' PT Short Term Goal 2 (Week 2): Pt will complete up/down 4 steps with BHRs min assist  Skilled Therapeutic Interventions/Progress Updates:    Pt received sitting in w/c with her husband present and pt agreeable to therapy session. Pt reports she has had increased "dizziness" this morning (this is a chronic symptom). Vitals assessed in sitting to start session: BP 137/87 (MAP 103), HR 90bpm  Transported to/from gym in w/c for time management and energy conservation.  Sit<>stands using RW with CGA for safety and verbal encouragement for increased independence.   Gait training 104ft + 87ft using RW with CGA/light min assist for safety/balance and +2 w/c follow to allow increased distance. Pt demonstrating the following gait deviations with therapist providing the described cuing and facilitation for improvement:  - incoordinated control of AD, moving back and forth unstable; however, does not cause worsening of LOB - slow gait speed and overall slower speed of movements - reciprocal although shorter step lengths bilaterally - slightly wider BOS   Pt reports her dizziness worsens with standing; therefore, performed orthostatic vital assessment as follows:  Seated: BP 148/90 (MAP 106), HR 97bpm  Standing: BP 156/94 (MAP 114), HR 109bpm  Reports the only thing that stops her "dizziness" symptoms is resting her head on a solid surface (ex: the window in the car, or a wall)  Transported towards her room and performed 27ft gait training into room using RW as described above.   At end of session, pt left seated in w/c in the care of her husband with OT arriving.  Therapy  Documentation Precautions:  Precautions Precautions: Fall Restrictions Weight Bearing Restrictions: No   Pain:  Declined pain medication at this time due to not wanting it simultaneously with anxiety medication.. Pt requesting and receiving anxiety medication at start of session. No complaints of pain during session.    Therapy/Group: Individual Therapy  Ginny Forth , PT, DPT, NCS, CSRS 06/19/2023, 7:51 AM

## 2023-06-19 NOTE — Progress Notes (Signed)
PROGRESS NOTE   Subjective/Complaints: No events overnight.  No acute complaints. Vital stable.  Labs stable this a.m. Small bowel movement overnight. Does feel overall more fluid in her legs over the past few days, along with muscle soreness.   ROS: Pertinent positives per HPI above.  Patient denies fever, rash, sore throat, blurred vision,  nausea, vomiting, diarrhea, cough, shortness of breath or chest pain, joint or back/neck pain, headache, or mood change.    Objective:   No results found. Recent Labs    06/17/23 0715  WBC 11.8*  HGB 8.2*  HCT 25.1*  PLT 262    Recent Labs    06/17/23 0715 06/19/23 0458  NA 139 142  K 3.8 3.8  CL 104 108  CO2 23 26  GLUCOSE 99 95  BUN 27* 22  CREATININE 1.20* 1.19*  CALCIUM 8.1* 7.9*     Intake/Output Summary (Last 24 hours) at 06/19/2023 0913 Last data filed at 06/19/2023 1610 Gross per 24 hour  Intake 480 ml  Output --  Net 480 ml     Pressure Injury 06/03/23 Buttocks Bilateral Deep Tissue Pressure Injury - Purple or maroon localized area of discolored intact skin or blood-filled blister due to damage of underlying soft tissue from pressure and/or shear. purple (Active)  06/03/23 1740  Location: Buttocks  Location Orientation: Bilateral  Staging: Deep Tissue Pressure Injury - Purple or maroon localized area of discolored intact skin or blood-filled blister due to damage of underlying soft tissue from pressure and/or shear.  Wound Description (Comments): purple  Present on Admission: Yes    Physical Exam: Vital Signs Blood pressure 139/80, pulse 75, temperature 98.3 F (36.8 C), resp. rate 16, weight 73.8 kg, SpO2 98%.  Constitutional: No distress . Vital signs reviewed. Sitting up in [facility] Cardiovascular: RRR without murmur. No JVD  . +2 dependent edema around thighs, buttocks - stable from yesterday Respiratory/Chest: CTA Bilaterally without wheezes or rales.  Mild bilateral lower lobe crackles - improved today. Normal effort    GI/Abdomen: BS +, non-tender, non-distended Ext: no clubbing, cyanosis,   Psych: pleasant and cooperative  Skin: C/D/I. No apparent lesions.  Bilateral buttocks bruising as below. Not examined today          Neurologic exam: alert and attentive Cognition: AAO to person, place, time  +Mild cognitive, memory deficits -mild delayed recall - unchanged Insight: reasonable  insight into current condition.  Mood: Pleasant affect, appropriate mood.  Strength: Antigravity against resistance bilateral upper and lower extremities, 4 out of 5 proximally in hip flexors; 5-/5 shoulder abductors; and 5 out of 5 distally.    LUE ataxia -ongoing,     Assessment/Plan: 1. Functional deficits which require 3+ hours per day of interdisciplinary therapy in a comprehensive inpatient rehab setting. Physiatrist is providing close team supervision and 24 hour management of active medical problems listed below. Physiatrist and rehab team continue to assess barriers to discharge/monitor patient progress toward functional and medical goals  Care Tool:  Bathing    Body parts bathed by patient: Right arm, Left arm, Chest, Abdomen, Right upper leg, Left upper leg, Face   Body parts bathed by helper: Left lower leg, Right  lower leg, Buttocks, Front perineal area     Bathing assist Assist Level: Moderate Assistance - Patient 50 - 74%     Upper Body Dressing/Undressing Upper body dressing   What is the patient wearing?: Pull over shirt    Upper body assist Assist Level: Minimal Assistance - Patient > 75%    Lower Body Dressing/Undressing Lower body dressing      What is the patient wearing?: Underwear/pull up     Lower body assist Assist for lower body dressing: Total Assistance - Patient < 25%     Toileting Toileting    Toileting assist Assist for toileting: Total Assistance - Patient < 25%     Transfers Chair/bed  transfer  Transfers assist  Chair/bed transfer activity did not occur: Safety/medical concerns  Chair/bed transfer assist level: Moderate Assistance - Patient 50 - 74%     Locomotion Ambulation   Ambulation assist      Assist level: 2 helpers Assistive device: Walker-rolling Max distance: 5'   Walk 10 feet activity   Assist  Walk 10 feet activity did not occur: Safety/medical concerns        Walk 50 feet activity   Assist Walk 50 feet with 2 turns activity did not occur: Safety/medical concerns         Walk 150 feet activity   Assist Walk 150 feet activity did not occur: Safety/medical concerns         Walk 10 feet on uneven surface  activity   Assist Walk 10 feet on uneven surfaces activity did not occur: Safety/medical concerns         Wheelchair     Assist Is the patient using a wheelchair?: Yes Type of Wheelchair: Manual    Wheelchair assist level: Dependent - Patient 0%      Wheelchair 50 feet with 2 turns activity    Assist        Assist Level: Dependent - Patient 0%   Wheelchair 150 feet activity     Assist      Assist Level: Dependent - Patient 0%   Blood pressure 139/80, pulse 75, temperature 98.3 F (36.8 C), resp. rate 16, weight 73.8 kg, SpO2 98%.   Medical Problem List and Plan: 1. Functional deficits secondary to debility after cardiac arrest and multiple related complications             -patient may shower             -ELOS/Goals: 12-14 days, min assist with PT, OT (now CGA/SPV goals), SLP - discharge date 10/22             -pt has episodes of fatigue/somnolence at baseline per husband. He says she does better in the later morning and early afternoon.   -Continue CIR therapies including PT, OT, and SLP     - 10/8: Patient self-limiting and complaining of soreness in her legs often; ?dizziness limiting. Walking will ask to sit down before she fatigues.   2.  Antithrombotics: -DVT/anticoagulation:   Mechanical:  Antiembolism stockings, knee (TED hose) Bilateral lower extremities (age indeterminate left posterior tib DVT). 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             -antiplatelet therapy: none   3. Pain Management: Tylenol, Norco, oxycodone as needed -Lidoderm patch             -  Oxycontin 10 mg q 12 hours             -observe for further sedation. Was somnolent this morning after receiving pain medication.   -10-3: Continue OxyContin 10 mg every 12 hours (is on 13.5 mg every 12 at home) just short acting to home regimen oxycodone 5 mg every 4 hours as needed     - 10/8: Add PRN methocarbamol for muscle soreness -  not used    10/5-6 pain appears controlled.  4. Mood/Behavior/Sleep: LCSW to evaluate and provide emotional support             -antipsychotic agents: Zyprexa 2.5 mg q HS-at home dose             -Xanax prn -at home dose   5. Neuropsych/cognition: This patient is capable of making decisions on his own behalf.   6. Skin/Wound Care: Routine skin care checks   -10-1: Family inquiring about protein supplementation, will look into supplements given on inpatient.   7. Fluids/Electrolytes/Nutrition: Routine Is and Os and follow-up chemistries   -10-1: See below, potassium borderline, magnesium repleted today.  Rechecking in AM.   - 10-2: Electrolytes repleted, labs normal, monitor  - 10/4: Encourage p.o. fluids due to tachycardia today; BMP in a.m.  -10/6 Potassium 3.2--increase supplement  -BUN/Cr holding fairly steady--continue to push fluids -10-7: Potassium 3.8.  Continue current supplement.  BUN/creatinine stable.  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  Will monitor for any functional decline, and may reach out to neuro-oncology for prolonged taper depending on symptom severity.  10/9: Symptoms stable   10: Bilateral rib fractures due to chest compressions -pain control as above   11: V-fib cardiac arrest:  -continue Pacerone 400 mg daily for one week, then 200 mg daily on 10/08             -not a candidate for invasive intervention             -K > 4; Mg > 2 -potassium 3.8 on admission labs, recheck in AM.  Magnesium 1.6, replete 2 g IV today and then start 400 mg twice daily due to recurrent drops.  Recheck in AM.             -if patient needs diuretics outpatient due to lower extremity edema then cardiology will consider changing to a potassium sparing diuretic such as spironolactone 25 mg daily along with some potassium supplementation and magnesium             -follow-up with Dr. Lynnette Caffey 3 weeks after D/C from CIR    - Daily weights  -10/6 supplement K+  -10-9: Reminding nursing to perform daily weights.will have cards eval today to initiate diuretic and adjust meds given ongoing swelling and mild hypotension Filed Weights   06/10/23 1735 06/15/23 0316 06/16/23 0709  Weight: 68.7 kg 69.8 kg 73.8 kg    12: Stage IV lung cancer/mets to brain:             -  continue Keppra 1000 mg BID             -follow-up with Dr. 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 fluids, labs stable, f/u monday  10-7: Creatinine stabilizing at 1.1-1.2; unchange d10-9  17: Anemia: ~3 gram drop in hemoglobin/no overt bleeding             -follow-up CBC. Most recently has been ~7.7; stable on admission labs             -check Hemoccult X 3 -pending             -has hemorrhoids, constipated             -continue PPI, Pepcid   - hgb stable at 7.8 on 10/5; greater than 8 10/7   18: DM: A1c = 7.3%, also on Decadron             -start CBGs QID and SSI             -carb modified diet as tolerated  Blood sugars well-controlled, monitor  -10-3: Mild elevation blood sugars over the last few days, likely with improved diet; not on any diabetic regimen as outpatient.  Monitor 1 day and consider initiating low-dose metformin if no improvement.   - 10-4: Patient working on dietary adjustments, mildly improved.  Continue to monitor Recent Labs    06/18/23 1701 06/18/23 2227 06/19/23 0728  GLUCAP 125* 138* 96    10/6 sugars have been elevated due to steroids--decadron down to 1mg  daily and readings improved except for readings around dinner time   -continue current cbg checks and SSI for now   -may be able to stop these soon 10-7 improved, monitor    19: Prior post fossa craniotomy for tumor resection 12/02/2020: resection of progressive cerebellar mass by Dr. Venetia Maxon. 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 Has chronic intermittent dizziness>>meclizine prn   - well controlled, no current complaints  -10/5 scheduled meclizine 25mg  tid per pt request - symptoms stable, intermittent  - Had prior rxn to scopalamine   20: External hemorrhoids: continue topical hydrocortisone 2.5%; reordered today, as suppository was prior ordered.   21:  Intermittent constipation: check for impaction then decide on appropriate regimen  10-1: Multiple small bowel movements overnight, add MiraLAX 17 mg daily  10-2: Small brown bowel movement this a.m., mushy consistency - improved  Last bowel movement 10/4--hopeful for BM today 10/6  Small bowel movement 10-7, increase MiraLAX to twice daily and add as needed Senokot per family request  10-9: Bowel movement overnight      LOS: 9 days A FACE TO FACE EVALUATION WAS PERFORMED  Angelina Sheriff 06/19/2023, 9:13 AM

## 2023-06-20 LAB — GLUCOSE, CAPILLARY
Glucose-Capillary: 99 mg/dL (ref 70–99)
Glucose-Capillary: 142 mg/dL — ABNORMAL HIGH (ref 70–99)
Glucose-Capillary: 149 mg/dL — ABNORMAL HIGH (ref 70–99)

## 2023-06-20 NOTE — Progress Notes (Addendum)
PROGRESS NOTE   Subjective/Complaints: No events overnight.  No acute complaints. Small BM ovenright. Vitals stable. Started on spirinolactone by Cards yesterday. Dexamethasone taper completed.  Patient with some ongoing feelings of swelling in her legs and weakness, but otherwise feels good.  Husband approached physician for private conversation out of the room; asking about his role as his wife's caregiver and when her estimated time for remaining life is.  Discussed that her cancer and multiple comorbidities make it difficult to determine an exact timeline, but emphasized her vulnerability and the likelihood of further medical complications coming with increased risk of death and disability.  Emphasized importance of respecting wife's current wishes for personal goals and quality of life; if she is to medically decline and be unable to endorse her own wishes, discussed his role as respecting her prior endorsed wishes to the best of his ability.  Did briefly discussed the role of guilt in many caregivers at end-of-life, and again informed that his role as medical decision-maker is to respect her wants.  Has been had some trouble putting his questions and concerns into words, and left conversation agreeable but somewhat unsure.  I did reach out to palliative care to possibly check in with them about remaining end-of-life care concerns prior to discharge 10-22.  ROS: Pertinent positives per HPI above.  Patient denies fever, rash, sore throat, blurred vision,  nausea, vomiting, diarrhea, cough, shortness of breath or chest pain, joint or back/neck pain, headache, or mood change.    Objective:   No results found. No results for input(s): "WBC", "HGB", "HCT", "PLT" in the last 72 hours.   Recent Labs    06/19/23 0458  NA 142  K 3.8  CL 108  CO2 26  GLUCOSE 95  BUN 22  CREATININE 1.19*  CALCIUM 7.9*     Intake/Output Summary (Last 24  hours) at 06/20/2023 1015 Last data filed at 06/19/2023 1646 Gross per 24 hour  Intake 1180 ml  Output --  Net 1180 ml     Pressure Injury 06/03/23 Buttocks Right Deep Tissue Pressure Injury - Purple or maroon localized area of discolored intact skin or blood-filled blister due to damage of underlying soft tissue from pressure and/or shear. purple (Active)  06/03/23 1740  Location: Buttocks  Location Orientation: Right  Staging: Deep Tissue Pressure Injury - Purple or maroon localized area of discolored intact skin or blood-filled blister due to damage of underlying soft tissue from pressure and/or shear.  Wound Description (Comments): purple  Present on Admission: Yes    Physical Exam: Vital Signs Blood pressure (!) 144/94, pulse 79, temperature (!) 97.5 F (36.4 C), resp. rate 17, weight 73.8 kg, SpO2 99%.  Constitutional: No distress . Vital signs reviewed. Sitting up independently on toilet. Cardiovascular: RRR without murmur. No JVD  . +1 dependent edema around thighs, buttocks - improved from yesterday Respiratory/Chest: CTA Bilaterally without wheezes or rales.  Comfortable on room air.  Improved GI/Abdomen: BS +, non-tender, non-distended Ext: no clubbing, cyanosis,   Psych: pleasant and cooperative  Skin: C/D/I. No apparent lesions.  Bilateral buttocks bruising as below. Not examined today          Neurologic  exam: alert and attentive Cognition: AAO to person, place, time  +Mild cognitive, memory deficits -mild delayed recall - unchanged Insight: reasonable  insight into current condition.  Mood: Pleasant affect, appropriate mood.  Strength: Antigravity against resistance bilateral upper and lower extremities, with some mild proximal compared to distal weakness.  Good core support in sitting independently. LUE ataxia -ongoing,   Assessment/Plan: 1. Functional deficits which require 3+ hours per day of interdisciplinary therapy in a comprehensive inpatient rehab  setting. Physiatrist is providing close team supervision and 24 hour management of active medical problems listed below. Physiatrist and rehab team continue to assess barriers to discharge/monitor patient progress toward functional and medical goals  Care Tool:  Bathing    Body parts bathed by patient: Right arm, Left arm, Chest, Abdomen, Right upper leg, Left upper leg, Face   Body parts bathed by helper: Left lower leg, Right lower leg, Buttocks, Front perineal area     Bathing assist Assist Level: Moderate Assistance - Patient 50 - 74%     Upper Body Dressing/Undressing Upper body dressing   What is the patient wearing?: Pull over shirt    Upper body assist Assist Level: Minimal Assistance - Patient > 75%    Lower Body Dressing/Undressing Lower body dressing      What is the patient wearing?: Underwear/pull up     Lower body assist Assist for lower body dressing: Total Assistance - Patient < 25%     Toileting Toileting    Toileting assist Assist for toileting: Total Assistance - Patient < 25%     Transfers Chair/bed transfer  Transfers assist  Chair/bed transfer activity did not occur: Safety/medical concerns  Chair/bed transfer assist level: Moderate Assistance - Patient 50 - 74%     Locomotion Ambulation   Ambulation assist      Assist level: 2 helpers Assistive device: Walker-rolling Max distance: 5'   Walk 10 feet activity   Assist  Walk 10 feet activity did not occur: Safety/medical concerns        Walk 50 feet activity   Assist Walk 50 feet with 2 turns activity did not occur: Safety/medical concerns         Walk 150 feet activity   Assist Walk 150 feet activity did not occur: Safety/medical concerns         Walk 10 feet on uneven surface  activity   Assist Walk 10 feet on uneven surfaces activity did not occur: Safety/medical concerns         Wheelchair     Assist Is the patient using a wheelchair?:  Yes Type of Wheelchair: Manual    Wheelchair assist level: Dependent - Patient 0%      Wheelchair 50 feet with 2 turns activity    Assist        Assist Level: Dependent - Patient 0%   Wheelchair 150 feet activity     Assist      Assist Level: Dependent - Patient 0%   Blood pressure (!) 144/94, pulse 79, temperature (!) 97.5 F (36.4 C), resp. rate 17, weight 73.8 kg, SpO2 99%.   Medical Problem List and Plan: 1. Functional deficits secondary to debility after cardiac arrest and multiple related complications             -patient may shower             -ELOS/Goals: 12-14 days, min assist with PT, OT (now CGA/SPV goals), SLP - discharge date 10/22             -  pt has episodes of fatigue/somnolence at baseline per husband. He says she does better in the later morning and early afternoon.   -Continue CIR therapies including PT, OT, and SLP     - 10/8: Patient self-limiting and complaining of soreness in her legs often; ?dizziness limiting. Walking will ask to sit down before she fatigues.    - 10/10: Reached out to palliative, requesting they touch base with patient and family again prior to discharge 10-22 regarding lingering questions on caregiver role and end-of-life  2.  Antithrombotics: -DVT/anticoagulation:  Mechanical:  Antiembolism stockings, knee (TED hose) Bilateral lower extremities (age indeterminate left posterior tib DVT). 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             -antiplatelet therapy: none   3. Pain Management: Tylenol, Norco, oxycodone as needed -Lidoderm patch             -Oxycontin 10 mg q 12 hours             -observe for further sedation. Was somnolent this morning after receiving pain medication.   -10-3: Continue OxyContin 10 mg every 12 hours (is on 13.5 mg every 12 at home) just short acting to home regimen oxycodone 5 mg  every 4 hours as needed     - 10/8: Add PRN methocarbamol for muscle soreness -  not used    10/5-6 pain appears controlled.  4. Mood/Behavior/Sleep: LCSW to evaluate and provide emotional support             -antipsychotic agents: Zyprexa 2.5 mg q HS-at home dose             -Xanax prn -at home dose   5. Neuropsych/cognition: This patient is capable of making decisions on his own behalf.   6. Skin/Wound Care: Routine skin care checks   -10-1: Family inquiring about protein supplementation, will look into supplements given on inpatient.   7. Fluids/Electrolytes/Nutrition: Routine Is and Os and follow-up chemistries   -10-1: See below, potassium borderline, magnesium repleted today.  Rechecking in AM.   - 10-2: Electrolytes repleted, labs normal, monitor  - 10/4: Encourage p.o. fluids due to tachycardia today; BMP in a.m.  -10/6 Potassium 3.2--increase supplement  -BUN/Cr holding fairly steady--continue to push fluids -10-7: Potassium 3.8.  Continue current supplement.  BUN/creatinine stable.  - labs in AM for BMP, Mag given soreness -stable 10/9    8: Hypertension: monitor TID and prn (home Norvasc held)   - Remains normotensive, monitor with addition of spironolactone    06/20/2023    3:09 AM 06/19/2023    8:15 PM 06/19/2023    2:23 PM  Vitals with BMI  Systolic 144 120 960  Diastolic 94 84 83  Pulse 79 84 92      9: Post-arrest ALI and PTA treatment for new metastatic brain lesion: on Decadron taper  10-7: With taper to 1 mg on Friday, symptoms of dizziness and ataxia have worsened somewhat.  Will monitor for any functional decline, and may reach out to neuro-oncology for prolonged taper depending on symptom severity.  10/10: Finished Decadron taper, discussed with family notifying provider if any worsening symptoms of dizziness, ataxia, or lethargy with this.  If needed to resume, will reach out to Dr. Barbaraann Cao   10: Bilateral rib fractures due to chest compressions -pain  control as above   11:  V-fib cardiac arrest:  -continue Pacerone 400 mg daily for one week, then 200 mg daily on 10/08             -not a candidate for invasive intervention             -K > 4; Mg > 2 -potassium 3.8 on admission labs, recheck in AM.  Magnesium 1.6, replete 2 g IV today and then start 400 mg twice daily due to recurrent drops.  Recheck in AM.             -if patient needs diuretics outpatient due to lower extremity edema then cardiology will consider changing to a potassium sparing diuretic such as spironolactone 25 mg daily along with some potassium supplementation and magnesium             -follow-up with Dr. Lynnette Caffey 3 weeks after D/C from CIR    - Daily weights  -10/6 supplement K+  -10-9: Reminding nursing to perform daily weights.will have cards eval today to initiate diuretic and adjust meds given ongoing swelling and mild hypotension  10/10: Cardiology resumed spironolactone 25 mg daily due to peripheral edema, appears to be helping.  Repeat BMP in next 1 to 2 days. 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 fluids, labs stable, f/u monday  10-7: Creatinine stabilizing at  1.1-1.2; unchange d10-9  17: Anemia: ~3 gram drop in hemoglobin/no overt bleeding             -follow-up CBC. Most recently has been ~7.7; stable on admission labs             -check Hemoccult X 3 -pending             -has hemorrhoids, constipated             -continue PPI, Pepcid   - hgb stable at 7.8 on 10/5; greater than 8 10/7   18: DM: A1c = 7.3%, also on Decadron             -start CBGs QID and SSI             -carb modified diet as tolerated  Blood sugars well-controlled, monitor  -10-3: Mild elevation blood sugars over the last few days, likely with improved diet; not on any diabetic regimen as outpatient.  Monitor 1 day and consider initiating low-dose  metformin if no improvement.   - 10-4: Patient working on dietary adjustments, mildly improved.  Continue to monitor Recent Labs    06/19/23 1627 06/19/23 2039 06/20/23 0616  GLUCAP 122* 179* 99    10/6 sugars have been elevated due to steroids--decadron down to 1mg  daily and readings improved except for readings around dinner time   -continue current cbg checks and SSI for now   -may be able to stop these soon 10-10: Remains stable, now off of steroids, monitor 1 to 2 days and consider addition of metformin if remains elevated / needing SSI    19: Prior post fossa craniotomy for tumor resection 12/02/2020: resection of progressive cerebellar mass by Dr. Venetia Maxon. 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 Has chronic intermittent dizziness>>meclizine prn   - well controlled, no current complaints  -10/5 scheduled meclizine 25mg  tid per pt request - symptoms stable, intermittent  - Had prior rxn to scopalamine   20: External hemorrhoids: continue topical hydrocortisone 2.5%; reordered today, as suppository was prior ordered.   21: Intermittent constipation: check for impaction then decide on appropriate regimen  10-1: Multiple small bowel movements overnight, add MiraLAX 17 mg daily  10-2: Small brown bowel movement this a.m., mushy  consistency - improved  Last bowel movement 10/4--hopeful for BM today 10/6  Small bowel movement 10-7, increase MiraLAX to twice daily and add as needed Senokot per family request  10-9: Bowel movement overnight      LOS: 10 days A FACE TO FACE EVALUATION WAS PERFORMED  Angelina Sheriff 06/20/2023, 10:15 AM

## 2023-06-20 NOTE — Plan of Care (Signed)
  Problem: RH BOWEL ELIMINATION Goal: RH STG MANAGE BOWEL WITH ASSISTANCE Description: STG Manage Bowel with toileting Assistance. Outcome: Progressing Goal: RH STG MANAGE BOWEL W/MEDICATION W/ASSISTANCE Description: STG Manage Bowel with Medication with mod I Assistance. Outcome: Progressing   Problem: RH BLADDER ELIMINATION Goal: RH STG MANAGE BLADDER WITH ASSISTANCE Description: STG Manage Bladder With toileting Assistance Outcome: Progressing Goal: RH STG MANAGE BLADDER WITH MEDICATION WITH ASSISTANCE Description: STG Manage Bladder With Medication With mod I Assistance. Outcome: Progressing   Problem: RH SKIN INTEGRITY Goal: RH STG SKIN FREE OF INFECTION/BREAKDOWN Description: Manage w min assist Outcome: Progressing Goal: RH STG MAINTAIN SKIN INTEGRITY WITH ASSISTANCE Description: STG Maintain Skin Integrity With min Assistance. Outcome: Progressing   Problem: RH SAFETY Goal: RH STG ADHERE TO SAFETY PRECAUTIONS W/ASSISTANCE/DEVICE Description: STG Adhere to Safety Precautions With cues Assistance/Device. Outcome: Progressing   Problem: RH PAIN MANAGEMENT Goal: RH STG PAIN MANAGED AT OR BELOW PT'S PAIN GOAL Description: < 4 with prns Outcome: Progressing   Problem: RH KNOWLEDGE DEFICIT GENERAL Goal: RH STG INCREASE KNOWLEDGE OF SELF CARE AFTER HOSPITALIZATION Description: Patient and spouse will be able to manage care at discharge with medications and dietary modification using educational resources independently Outcome: Progressing   Problem: Consults Goal: RH GENERAL PATIENT EDUCATION Description: See Patient Education module for education specifics. Outcome: Progressing

## 2023-06-20 NOTE — Progress Notes (Signed)
Physical Therapy Session Note  Patient Details  Name: Virginia [name] MRN: [id number] Date of Birth: Apr 15, 1956  Today's Date: 06/20/2023 PT Individual Time: 0945-1055 PT Individual Time Calculation (min): 70 min   Short Term Goals: Week 2:  PT Short Term Goal 1 (Week 2): Pt will ambulate with RW min assist 50' PT Short Term Goal 2 (Week 2): Pt will complete up/down 4 steps with BHRs min assist PT Short Term Goal 3 (Week 2): Pt will complete bed mobility with CGA PT Short Term Goal 4 (Week 2): Pt will complete transfers consistently CGA with LRAD  Skilled Therapeutic Interventions/Progress Updates:    Pt presents in room in [facility], agreeable to PT, pt denies pain at this time reporting premedicated. Session focused on gait training for increasing tolerance to upright as well as therex to promote BUE/BLE strengthening and muscular endurance needed for functional gait and transfers. Pt completes sit<>stands and stand pivot transfers with RW CGA/min assist during session.   Pt ambulates 25' with RW CGA/min assist, making R turns with some difficulty however demonstrates increased stability for managing RW with turns. Pt ambulates 97' and 57' with RW CGA/min assist 2nd person WC follow with fatigue with pt demonstrating decreased proximity and increased UE weightbearing with fatigue RW.  Pt completes interval training on nustep with BUE/BLE 10 min total work/1 min rest to promote improved tolerance to activity, workload 3. Pt maintains 60-65 SPM throughout activity and improved LUE/LLE placement and use during exercise.  Pt then transported to main gym and completes step ups x5 BLE with BHRs to promote improved BLE strengthening and muscle fiber recruitment needed for functional gait and transfers.  Pt provided with seated rest breaks between all gait trials and exercises to promote energy conservation and quality with tasks.  Pt returns to room and remains seated in [facility] with all needs  within reach, cal light in place and husband present at end of session.  Therapy Documentation Precautions:  Precautions Precautions: Fall Restrictions Weight Bearing Restrictions: No  Therapy/Group: Individual Therapy  Edwin Cap PT, DPT 06/20/2023, 10:58 AM

## 2023-06-20 NOTE — Evaluation (Signed)
Recreational Therapy Assessment and Plan  Patient Details  Name: Virginia [name] MRN: [id number] Date of Birth: March 29, 1956 Today's Date: 06/20/2023  Rehab Potential:  Good ELOS:   d/c 10/18  Assessment Hospital Problem: Principal Problem:   Cardiac arrest [facility]) Active Problems:   Lung cancer metastatic to brain [facility])   Debility   History of DVT (deep vein thrombosis)   Urine retention   Acute blood loss anemia   History of ventricular fibrillation     Past Medical History:      Past Medical History:  Diagnosis Date   Anemia     Anxiety     Concussion 09/28/2019   DVT (deep venous thrombosis) (HCC) 2021    L leg   Dyspnea     GERD (gastroesophageal reflux disease)     Hypercholesterolemia      per pt, she does not have elevated lipids   Hypertension     met lung ca dx'd 09/2019    mets to spine, hip and brain   PONV (postoperative nausea and vomiting)     Tobacco abuse          Past Surgical History:       Past Surgical History:  Procedure Laterality Date   ABDOMINAL HYSTERECTOMY        partial/ left ovaries   APPLICATION OF CRANIAL NAVIGATION N/A 12/02/2020    Procedure: APPLICATION OF CRANIAL NAVIGATION;  Surgeon: Maeola Harman, MD;  Location: [facility] OR;  Service: Neurosurgery;  Laterality: N/A;   CHOLECYSTECTOMY       CRANIOTOMY N/A 12/02/2020    Procedure: Posterior fossa craniotomy for tumor resection with brainlab;  Surgeon: Maeola Harman, MD;  Location: [facility] OR;  Service: Neurosurgery;  Laterality: N/A;   DILATION AND CURETTAGE OF UTERUS       IR IMAGING GUIDED PORT INSERTION   10/23/2019   IR PATIENT EVAL TECH 0-60 MINS   11/21/2021   IR PATIENT EVAL TECH 0-60 MINS   11/24/2021   IR PATIENT EVAL TECH 0-60 MINS   11/29/2021   IR PATIENT EVAL TECH 0-60 MINS   12/04/2021   IR PATIENT EVAL TECH 0-60 MINS   12/12/2021   IR PATIENT EVAL TECH 0-60 MINS   12/19/2021   IR PATIENT EVAL TECH 0-60 MINS   12/27/2021   IR PATIENT EVAL TECH 0-60 MINS   01/03/2022   IR PATIENT  EVAL TECH 0-60 MINS   01/10/2022   IR PATIENT EVAL TECH 0-60 MINS   01/18/2022   IR PATIENT EVAL TECH 0-60 MINS   02/13/2022   IR PATIENT EVAL TECH 0-60 MINS   02/20/2022   IR RADIOLOGIST EVAL & MGMT   01/23/2022   IR REMOVAL TUN ACCESS W/ PORT W/O FL MOD SED   11/17/2021   KYPHOPLASTY N/A 03/15/2020    Procedure: Thoracic Eight KYPHOPLASTY;  Surgeon: Maeola Harman, MD;  Location: [facility] OR;  Service: Neurosurgery;  Laterality: N/A;  prone     LIPOMA EXCISION   2018    removed under left breast and right thigh.   TUBAL LIGATION              Assessment & Plan Clinical Impression: Patient is a 67 year old female who suffered a + cardiac arrest. Presenting rhythm was apparently VF, but family felt that the arrest may have been brought on by a seizure. Estimated downtime greater than 20 minutes. On ED arrival patient was seen hemodynamically stable but emergently intubated for airway protection.  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  She had nevertheless responded well to recent treatment. She also has a history of prior DVT, hypertension, hyperlipidemia, tobacco abuse and anxiety. S/p craniotomy, resection of progressive cerebellar mass by Dr. Venetia Maxon on 12/02/2020.  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    Extubated on 9/25. Transitioned to oral amiodarone. Not a candidate for [facility] due to ? prior brain bleed while on Eliquis in setting of brain mets. Unclear reason for no pharmacologic DVT prophylaxis. TTE notes diminished EF at 45-50% with global hypokinesis - Cardiology agree she is not a candidate for invasive intervention and suggests continuing amiodarone. EEG 9/23 suggestive of severe diffuse encephalopathy with no seizure or epileptiform discharges noted - no active seizure activity appreciated since hospitalization - it is certainly possible that rather than seizure she was experiencing arrhythmia related syncope leading to her respiratory irregularities though she of course  would be at risk for seizure activity as well - plan to continue Keppra indefinitely. Decadron taper for acute lung injury.   Pt presents with decreased activity tolerance, decreased functional mobility, decreased balance, decreased coordination, decreased visual perceptual skills, decreased midline orientation, left inattention, decreased initiation, decreased problem solving, decreased memory, Limiting pt's independence with leisure/community pursuits.  Met with pt & husband today to discuss TR services including leisure education, activity analysis/modifications and stress management.  Also discussed the importance of social, emotional, spiritual health in addition to physical health and their effects on overall health and wellness.  Pt stated understanding.   Plan  Min 1 TR session >20 minutes during LOS  Recommendations for other services: None   Discharge Criteria: Patient will be discharged from TR if patient refuses treatment 3 consecutive times without medical reason.  If treatment goals not met, if there is a change in medical status, if patient makes no progress towards goals or if patient is discharged from hospital.  The above assessment, treatment plan, treatment alternatives and goals were discussed and mutually agreed upon: by patient  Virginia [name] 06/20/2023, 11:12 AM

## 2023-06-20 NOTE — Group Note (Signed)
Patient Details Name: ILEANNA GEMMILL MRN: [id number] DOB: 07/23/1956 Today's Date: 06/20/2023  Time Calculation: OT Group Time Calculation OT Group Start Time: 1430 OT Group Stop Time: 1530 OT Group Time Calculation (min): 60 min      Group Description: Dance Group: Pt participated in dance group with an emphasis on social interaction, motor planning, increasing overall activity tolerance and bimanual tasks. All songs were selected by group members. Dance moves included AROM of BUE/BLE gross motor movements with an emphasis on building functional endurance.    Individual level documentation: Patient completed group from sitting level. Patient needed supervision to complete various dance moves with cues to initiate rest breaks.  Patient able to create her own modifications during group.  Pain: Pain Assessment Pain Scale: 0-10 Pain Score: 0-No pain  Precautions:  Falls   Tollie Pizza [facility] 06/20/2023, 3:44 PM

## 2023-06-20 NOTE — Progress Notes (Signed)
Occupational Therapy Session Note  Patient Details  Name: ANALIAH DRUM MRN: [id number] Date of Birth: 1956/06/05  Today's Date: 06/20/2023 OT Individual Time: 4098-1191 OT Individual Time Calculation (min): 70 min    Short Term Goals: Week 2:  OT Short Term Goal 1 (Week 2): Pt will tolerate standing for 4 minutes within BADL task for functional endurance OT Short Term Goal 2 (Week 2): Patient will perform 1 step of toileting task OT Short Term Goal 3 (Week 2): Patient will don shirt with set-up A  Skilled Therapeutic Interventions/Progress Updates:    Pt greeted seated in wc coloring and agreeable to OT treatment session. Pt agreeable to go outside for therapy session. Worked on L hand FMC with graded peg board task. Focus on in-hand manipulation, translation, and rotation of pegs. Functional ambulation on uneven surfaces with RW and min A. With multiple rest breaks. Pt fatigued more quickly due to walking on a slight incline. UB there-ex using red theraband, 4 sets of 10 chest pull, triceps press, seated row, and bicep curl. Pt returned to room and left seated in wc with husband and nursing present. Needs met.   Therapy Documentation Precautions:  Precautions Precautions: Fall Restrictions Weight Bearing Restrictions: No  Pain:  Pt reports pain is managed at this time.   Therapy/Group: Individual Therapy  Mal Amabile 06/20/2023, 12:57 PM

## 2023-06-20 NOTE — Group Note (Signed)
Patient Details Name: Virginia [name] MRN: [id number] DOB: [birth year] Today's Date: 06/20/2023 Pt will participate in 60 minutes dance group.  MET Group Description: Dance Group: Pt participated in dance group with an emphasis on social interaction, motor planning, increasing overall activity tolerance and bimanual tasks. All songs were selected by group members. Dance moves included AROM of BUE/BLE gross motor movements with an emphasis on building functional endurance.    Individual level documentation: Patient completed group from sitting level. Patient needed supervision to complete various dance moves.  Patient needed minimal modifications during group.  Pain:no c/o Pain Assessment Pain Scale: 0-10 Pain Score: 0-No pain  Meaghen Vecchiarelli 06/20/2023, 3:49 PM

## 2023-06-21 LAB — CBC WITH DIFFERENTIAL/PLATELET
Abs Immature Granulocytes: 0.21 10*3/uL — ABNORMAL HIGH (ref 0.00–0.07)
Basophils Absolute: 0 10*3/uL (ref 0.0–0.1)
Basophils Relative: 0 %
Eosinophils Absolute: 0.1 10*3/uL (ref 0.0–0.5)
Eosinophils Relative: 1 %
HCT: 25.4 % — ABNORMAL LOW (ref 36.0–46.0)
Hemoglobin: 8.2 g/dL — ABNORMAL LOW (ref 12.0–15.0)
Immature Granulocytes: 2 %
Lymphocytes Relative: 35 %
Lymphs Abs: 3.3 10*3/uL (ref 0.7–4.0)
MCH: 33.9 pg (ref 26.0–34.0)
MCHC: 32.3 g/dL (ref 30.0–36.0)
MCV: 105 fL — ABNORMAL HIGH (ref 80.0–100.0)
Monocytes Absolute: 1.2 10*3/uL — ABNORMAL HIGH (ref 0.1–1.0)
Monocytes Relative: 12 %
Neutro Abs: 4.6 10*3/uL (ref 1.7–7.7)
Neutrophils Relative %: 50 %
Platelets: 230 10*3/uL (ref 150–400)
RBC: 2.42 MIL/uL — ABNORMAL LOW (ref 3.87–5.11)
RDW: 20.3 % — ABNORMAL HIGH (ref 11.5–15.5)
WBC: 9.3 10*3/uL (ref 4.0–10.5)
nRBC: 0 % (ref 0.0–0.2)

## 2023-06-21 LAB — GLUCOSE, CAPILLARY
Glucose-Capillary: 103 mg/dL — ABNORMAL HIGH (ref 70–99)
Glucose-Capillary: 92 mg/dL (ref 70–99)
Glucose-Capillary: 121 mg/dL — ABNORMAL HIGH (ref 70–99)
Glucose-Capillary: 136 mg/dL — ABNORMAL HIGH (ref 70–99)

## 2023-06-21 LAB — BASIC METABOLIC PANEL
Anion gap: 9 (ref 5–15)
BUN: 28 mg/dL — ABNORMAL HIGH (ref 8–23)
CO2: 27 mmol/L (ref 22–32)
Calcium: 8.7 mg/dL — ABNORMAL LOW (ref 8.9–10.3)
Chloride: 103 mmol/L (ref 98–111)
Creatinine, Ser: 0.96 mg/dL (ref 0.44–1.00)
GFR, Estimated: >60 mL/min (ref >60)
Glucose, Bld: 111 mg/dL — ABNORMAL HIGH (ref 70–99)
Potassium: 4.7 mmol/L (ref 3.5–5.1)
Sodium: 139 mmol/L (ref 135–145)

## 2023-06-21 LAB — MAGNESIUM: Magnesium: 2 mg/dL (ref 1.7–2.4)

## 2023-06-21 MED ORDER — DEXAMETHASONE 0.5 MG PO TABS
1.0000 mg | ORAL_TABLET | Freq: Every day | ORAL | Status: DC
  Administered 2023-06-21 – 2023-07-02 (×12): 1 mg via ORAL
  Filled 2023-06-21 (×12): qty 2

## 2023-06-21 MED ORDER — MECLIZINE HCL 25 MG PO TABS
50.0000 mg | ORAL_TABLET | Freq: Three times a day (TID) | ORAL | Status: DC
  Administered 2023-06-21 – 2023-07-02 (×33): 50 mg via ORAL
  Filled 2023-06-21 (×32): qty 2

## 2023-06-21 NOTE — Progress Notes (Addendum)
PROGRESS NOTE   Subjective/Complaints: No events overnight.  No acute complaints.  States Dr. [name] came by to see them overnight last night, talked for about 30 minutes.  Were very happy with the visit. BG remains intermittently elevated off of steroids, but mild, <200.  2x BM yesterday; continent b/b.    ROS: Pertinent positives per HPI above.  Patient denies fever, rash, sore throat, blurred vision,  nausea, vomiting, diarrhea, cough, shortness of breath or chest pain, joint or back/neck pain, headache, or mood change.    Objective:   No results found. No results for input(s): "WBC", "HGB", "HCT", "PLT" in the last 72 hours.   Recent Labs    06/19/23 0458  NA 142  K 3.8  CL 108  CO2 26  GLUCOSE 95  BUN 22  CREATININE 1.19*  CALCIUM 7.9*     Intake/Output Summary (Last 24 hours) at 06/21/2023 1032 Last data filed at 06/21/2023 0720 Gross per 24 hour  Intake 240 ml  Output --  Net 240 ml     Pressure Injury 06/03/23 Buttocks Right Deep Tissue Pressure Injury - Purple or maroon localized area of discolored intact skin or blood-filled blister due to damage of underlying soft tissue from pressure and/or shear. purple (Active)  06/03/23 1740  Location: Buttocks  Location Orientation: Right  Staging: Deep Tissue Pressure Injury - Purple or maroon localized area of discolored intact skin or blood-filled blister due to damage of underlying soft tissue from pressure and/or shear.  Wound Description (Comments): purple  Present on Admission: Yes     Pressure Injury 06/03/23 Buttocks Left Deep Tissue Pressure Injury - Purple or maroon localized area of discolored intact skin or blood-filled blister due to damage of underlying soft tissue from pressure and/or shear. (Active)  06/03/23   Location: Buttocks  Location Orientation: Left  Staging: Deep Tissue Pressure Injury - Purple or maroon localized area of discolored  intact skin or blood-filled blister due to damage of underlying soft tissue from pressure and/or shear.  Wound Description (Comments):   Present on Admission: Yes    Physical Exam: Vital Signs Blood pressure (!) 140/86, pulse 87, temperature 97.6 F (36.4 C), resp. rate 17, weight 73.8 kg, SpO2 100%.  Constitutional: No distress . Vital signs reviewed. Sitting up in WC.  Eyes: Horizontal nystagmus with gaze internal jugular all directions. EOMI. PERRLA Cardiovascular: RRR without murmur. No JVD  . Trace dependent edema around thighs, buttocks - improved   Respiratory/Chest: CTA Bilaterally without wheezes or rales.  Comfortable on room air.    GI/Abdomen: BS +, non-tender, non-distended Ext: no clubbing, cyanosis,   Psych: pleasant and cooperative; mildly lethargic Skin: C/D/I. No apparent lesions.  Bilateral buttocks bruising as below - imprivuing          Neurologic exam: alert and attentive Cognition: AAO to person, place, time  +Mild cognitive, memory deficits -mild delayed recall - somewhat worse today Insight: reasonable  insight into current condition.  Mood: Pleasant affect, appropriate mood.  Strength: Antigravity against resistance bilateral upper and lower extremities, with some mild LUE weakness 5-/5.  LUE ataxia -ongoing, worse today   Assessment/Plan: 1. Functional deficits which require 3+ hours  per day of interdisciplinary therapy in a comprehensive inpatient rehab setting. Physiatrist is providing close team supervision and 24 hour management of active medical problems listed below. Physiatrist and rehab team continue to assess barriers to discharge/monitor patient progress toward functional and medical goals  Care Tool:  Bathing    Body parts bathed by patient: Right arm, Left arm, Chest, Abdomen, Right upper leg, Left upper leg, Face, Front perineal area   Body parts bathed by helper: Left lower leg, Right lower leg, Buttocks, Front perineal area      Bathing assist Assist Level: Moderate Assistance - Patient 50 - 74%     Upper Body Dressing/Undressing Upper body dressing   What is the patient wearing?: Pull over shirt    Upper body assist Assist Level: Contact Guard/Touching assist    Lower Body Dressing/Undressing Lower body dressing      What is the patient wearing?: Underwear/pull up, Pants     Lower body assist Assist for lower body dressing: Moderate Assistance - Patient 50 - 74%     Toileting Toileting    Toileting assist Assist for toileting: Moderate Assistance - Patient 50 - 74%     Transfers Chair/bed transfer  Transfers assist  Chair/bed transfer activity did not occur: Safety/medical concerns  Chair/bed transfer assist level: Minimal Assistance - Patient > 75%     Locomotion Ambulation   Ambulation assist      Assist level: 2 helpers Assistive device: Walker-rolling Max distance: 50'   Walk 10 feet activity   Assist  Walk 10 feet activity did not occur: Safety/medical concerns  Assist level: Minimal Assistance - Patient > 75% Assistive device: Walker-rolling   Walk 50 feet activity   Assist Walk 50 feet with 2 turns activity did not occur: Safety/medical concerns  Assist level: 2 helpers Assistive device: Walker-rolling    Walk 150 feet activity   Assist Walk 150 feet activity did not occur: Safety/medical concerns         Walk 10 feet on uneven surface  activity   Assist Walk 10 feet on uneven surfaces activity did not occur: Safety/medical concerns         Wheelchair     Assist Is the patient using a wheelchair?: Yes Type of Wheelchair: Manual    Wheelchair assist level: Dependent - Patient 0%      Wheelchair 50 feet with 2 turns activity    Assist        Assist Level: Dependent - Patient 0%   Wheelchair 150 feet activity     Assist      Assist Level: Dependent - Patient 0%   Blood pressure (!) 140/86, pulse 87, temperature 97.6  F (36.4 C), resp. rate 17, weight 73.8 kg, SpO2 100%.   Medical Problem List and Plan: 1. Functional deficits secondary to debility after cardiac arrest and multiple related complications             -patient may shower             -ELOS/Goals: 12-14 days, min assist with PT, OT (now CGA/SPV goals), SLP - discharge date 10/22             -pt has episodes of fatigue/somnolence at baseline per husband. He says she does better in the later morning and early afternoon.   -Continue CIR therapies including PT, OT, and SLP     - 10/10: Reached out to palliative, requesting they touch base with patient and family again prior to discharge  10-22 regarding lingering questions on caregiver role and end-of-life  2.  Antithrombotics: -DVT/anticoagulation:  Mechanical:  Antiembolism stockings, knee (TED hose) Bilateral lower extremities (age indeterminate left posterior tib DVT). 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             -antiplatelet therapy: none   3. Pain Management: Tylenol, Norco, oxycodone as needed -Lidoderm patch             -Oxycontin 10 mg q 12 hours             -observe for further sedation. Was somnolent this morning after receiving pain medication.   -10-3: Continue OxyContin 10 mg every 12 hours (is on 13.5 mg every 12 at home) just short acting to home regimen oxycodone 5 mg every 4 hours as needed     - 10/8: Add PRN methocarbamol for muscle soreness -  not used    10/5-6 pain appears controlled.  4. Mood/Behavior/Sleep: LCSW to evaluate and provide emotional support             -antipsychotic agents: Zyprexa 2.5 mg q HS-at home dose             -Xanax prn -at home dose   5. Neuropsych/cognition: This patient is capable of making decisions on his own behalf.   6. Skin/Wound Care: Routine skin care checks   -10-1: Family inquiring about protein supplementation,  will look into supplements given on inpatient.   7. Fluids/Electrolytes/Nutrition: Routine Is and Os and follow-up chemistries   -10-1: See below, potassium borderline, magnesium repleted today.  Rechecking in AM.   - 10-2: Electrolytes repleted, labs normal, monitor  - 10/4: Encourage p.o. fluids due to tachycardia today; BMP in a.m.  -10/6 Potassium 3.2--increase supplement  -BUN/Cr holding fairly steady--continue to push fluids -10-7: Potassium 3.8.  Continue current supplement.  BUN/creatinine stable.  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  Will monitor for any functional decline, and may reach out to neuro-oncology for prolonged taper depending on symptom severity.  10/10: Finished Decadron taper, discussed with family notifying provider if any worsening symptoms of dizziness, ataxia, or lethargy with this.  If needed to resume, will reach out to Dr. Barbaraann Cao   10: Bilateral rib fractures due to chest compressions -pain control as above   11: V-fib cardiac arrest:  -continue Pacerone 400 mg daily for one week, then 200 mg daily on 10/08             -not a candidate for invasive intervention             -K > 4; Mg > 2 -potassium 3.8 on admission labs, recheck in AM.  Magnesium 1.6, replete 2 g IV  today and then start 400 mg twice daily due to recurrent drops.  Recheck in AM.             -if patient needs diuretics outpatient due to lower extremity edema  then cardiology will consider changing to a potassium sparing diuretic such as spironolactone 25 mg daily along with some potassium supplementation and magnesium             -follow-up with Dr. Lynnette Caffey 3 weeks after D/C from CIR    - Daily weights - reodered 10/11  -10/6 supplement K+  -10-9: Reminding nursing to perform daily weights.will have cards eval today to initiate diuretic and adjust meds given ongoing swelling and mild hypotension  10/10: Cardiology resumed spironolactone 25 mg daily due to peripheral edema, appears to be helping.    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 fluids, labs stable, f/u monday  10-7: Creatinine stabilizing at 1.1-1.2; unchange d10-9  17: Anemia: ~3 gram drop in hemoglobin/no overt bleeding             -follow-up CBC. Most recently has been ~7.7; stable on admission labs             -check Hemoccult X 3 -pending             -has hemorrhoids, constipated             -continue PPI, Pepcid   - hgb stable at 7.8 on 10/5; greater than 8 10/7   18: DM: A1c = 7.3%, also on Decadron             -start CBGs QID and SSI             -carb modified diet as  tolerated  Blood sugars well-controlled, monitor  -10-3: Mild elevation blood sugars over the last few days, likely with improved diet; not on any diabetic regimen as outpatient.  Monitor 1 day and consider initiating low-dose metformin if no improvement.   - 10-4: Patient working on dietary adjustments, mildly improved.  Continue to monitor Recent Labs    06/20/23 1701 06/20/23 2041 06/21/23 0631  GLUCAP 142* 149* 103*    10/6 sugars have been elevated due to steroids--decadron down to 1mg  daily and readings improved except for readings around dinner time   -continue current cbg checks and SSI for now   -  may be able to stop these soon 10-11: Stable, occassional 140s but overall <120. 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 Has chronic intermittent dizziness>>meclizine prn   - well controlled, no current complaints  -10/5 scheduled meclizine 25mg  tid per pt request -> increase to 50 mg TID 10/11; this was OP dose  - Had prior rxn to scopalamine  - 10/11: Symptoms worsening s/p steroid taper, messaged Dr. Barbaraann Cao regarding resuming 1 mg daily until OP follow up --> he is in agreement with this, resumed 10/11   20: External hemorrhoids: continue topical hydrocortisone 2.5%; reordered today, as suppository was prior ordered.   21: Intermittent constipation: check for impaction then decide on appropriate regimen  10-1: Multiple small bowel movements overnight, add MiraLAX 17 mg daily  10-2: Small brown bowel movement this a.m., mushy consistency - improved  Last bowel movement 10/4--hopeful for BM today 10/6  Small bowel movement 10-7, increase MiraLAX to twice daily and add as needed Senokot per family request  10-10: Last BM, improvining      LOS: 11 days A FACE TO FACE EVALUATION WAS PERFORMED  Angelina Sheriff 06/21/2023, 10:32 AM

## 2023-06-21 NOTE — Progress Notes (Signed)
Physical Therapy Session Note  Patient Details  Name: Virginia [name] MRN: [id number] Date of Birth: 02-10-1956  Today's Date: 06/21/2023 PT Individual Time: 1005-1045 + 1455-1535 PT Individual Time Calculation (min): 40 min + 40 min  Short Term Goals: Week 2:  PT Short Term Goal 1 (Week 2): Pt will ambulate with RW min assist 50' PT Short Term Goal 2 (Week 2): Pt will complete up/down 4 steps with BHRs min assist PT Short Term Goal 3 (Week 2): Pt will complete bed mobility with CGA PT Short Term Goal 4 (Week 2): Pt will complete transfers consistently CGA with LRAD  Skilled Therapeutic Interventions/Progress Updates:    SESSION 1: Pt presents in room in [facility], agreeable to PT, reports pain in low back, RN notified and medicates during session. Session focused on therapeutic exercise to promote functional endurance and BUE/BLE strength needed for functional transfers and ambulation. Pt completes transfers with CGA/min assist throughout session with RW.  Pt conmpletes interval training on nustep BUE/BLE 30 sec work/30sec rest 15 min, initial 7 min at workload 3, increased to workload 6 for last 8 minutes. Pt with good management of L hip and LUE on handle. Pt reports decreased pain in back with exercise.  Pt completes x8 sit<>stands CGA/light min assist for holding RW in place and for LUE placement on RW.  Pt provided with seated rest breaks between all exercises to promote energy conservation and quality with tasks. Pt provided with education during rest break on energy conservation and dizziness.  Pt returns to room and remains seated in [facility] with all needs within reach, cal light in place and husband present at end of session.   SESSION 2: Pt presents in room in [facility] agreeable to PT, reports pain in back RN present and medicates during session. Session focused on gait training with RW without WC follow to promote improved tolerance to upright, turning technique, and energy conservation  and management during session.  Pt ambulates self selected distances of 2x40', 66' with RW CGA/min assist with cues for turning technique with pt taking wide turns. Pt demonstrates improved upright posture and proximity to RW as well as decreased anxiety with gait. Pt educated during rest breaks on energy conservation with tasks as well as safety with fatigue. Pt does demonstrate premature sitting and requires min progress to mod verbal cues with final trial for positioning prior to sitting with fatigue.  Pt completes one trial forward/backward walking with RW x5' with verbal cues on sequencing and maintaining proximity to RW, completed to improve pt RW mgmt needed for functional transfers and positioning RW prior to sitting.  Pt returns to room at end of session with husband via WC.  Therapy Documentation Precautions:  Precautions Precautions: Fall Restrictions Weight Bearing Restrictions: No   Therapy/Group: Individual Therapy  Edwin Cap PT, DPT 06/21/2023, 4:49 PM

## 2023-06-21 NOTE — Plan of Care (Signed)
  Problem: RH BOWEL ELIMINATION Goal: RH STG MANAGE BOWEL WITH ASSISTANCE Description: STG Manage Bowel with toileting Assistance. Outcome: Progressing Goal: RH STG MANAGE BOWEL W/MEDICATION W/ASSISTANCE Description: STG Manage Bowel with Medication with mod I Assistance. Outcome: Progressing   Problem: RH BLADDER ELIMINATION Goal: RH STG MANAGE BLADDER WITH ASSISTANCE Description: STG Manage Bladder With toileting Assistance Outcome: Progressing Goal: RH STG MANAGE BLADDER WITH MEDICATION WITH ASSISTANCE Description: STG Manage Bladder With Medication With mod I Assistance. Outcome: Progressing   Problem: RH SKIN INTEGRITY Goal: RH STG SKIN FREE OF INFECTION/BREAKDOWN Description: Manage w min assist Outcome: Progressing Goal: RH STG MAINTAIN SKIN INTEGRITY WITH ASSISTANCE Description: STG Maintain Skin Integrity With min Assistance. Outcome: Progressing   Problem: RH SAFETY Goal: RH STG ADHERE TO SAFETY PRECAUTIONS W/ASSISTANCE/DEVICE Description: STG Adhere to Safety Precautions With cues Assistance/Device. Outcome: Progressing   Problem: RH PAIN MANAGEMENT Goal: RH STG PAIN MANAGED AT OR BELOW PT'S PAIN GOAL Description: < 4 with prns Outcome: Progressing   Problem: RH KNOWLEDGE DEFICIT GENERAL Goal: RH STG INCREASE KNOWLEDGE OF SELF CARE AFTER HOSPITALIZATION Description: Patient and spouse will be able to manage care at discharge with medications and dietary modification using educational resources independently Outcome: Progressing   Problem: Consults Goal: RH GENERAL PATIENT EDUCATION Description: See Patient Education module for education specifics. Outcome: Progressing

## 2023-06-21 NOTE — Progress Notes (Signed)
Occupational Therapy Session Note  Patient Details  Name: Virginia [name] MRN: [id number] Date of Birth: 23-Dec-1955  Today's Date: 06/21/2023 OT Individual Time: 0865-7846 OT Individual Time Calculation (min): 55 min    Short Term Goals: Week 2:  OT Short Term Goal 1 (Week 2): Pt will tolerate standing for 4 minutes within BADL task for functional endurance OT Short Term Goal 2 (Week 2): Patient will perform 1 step of toileting task OT Short Term Goal 3 (Week 2): Patient will don shirt with set-up A  Skilled Therapeutic Interventions/Progress Updates:    Pt greeted seated in wc with spouse present and agreeable to OT treatment session. PT brought to therapy gym for UB there-ex using [employer] arm bike on level 2, 5 minutes forward, then 5 minutes backwards. Pt brought to long hallway with slight incline for functional ambulation w/ RW. Pt completed 3 bouts of ambulation at 20  feet, 40 feet, then 45 feet, with rest breaks in between. Addressed standing balance/endurance and UB there-ex with standing UB 2 lb bar exercises. Pt needed min A and cues for trunk and glute activation to stay upright while completed 3 sets of 10 chest press and bicep curl. Rest breaks in between sets. Pt returned to room and left seated in wc with husband present and needs met.   Therapy Documentation Precautions:  Precautions Precautions: Fall Restrictions Weight Bearing Restrictions: No Pain: Pt reports pain is manageable at this time. No number given.    Therapy/Group: Individual Therapy  Mal Amabile 06/21/2023, 8:48 AM

## 2023-06-21 NOTE — Progress Notes (Signed)
Speech Language Pathology Daily Session Note  Patient Details  Name: Virginia [name] MRN: [id number] Date of Birth: 1956/07/21  Today's Date: 06/21/2023 SLP Individual Time: 1045-1130 SLP Individual Time Calculation (min): 45 min  Short Term Goals: Week 2: SLP Short Term Goal 1 (Week 2): Patient will solve basic environmental problems in 4/5 opportunities given minA. SLP Short Term Goal 2 (Week 2): Patient will attend to the left during functional vision-based tasks (i.e. reading, drawing, etc.) given minA. SLP Short Term Goal 3 (Week 2): Patient will utilize external memory and orientation aids to recall time, day of the week, and basic daily information given minA. SLP Short Term Goal 4 (Week 2): Patient will demonstrate emergent awareness of problems during basic functional tasks in 4/5 opportunities given modA. SLP Short Term Goal 5 (Week 2): Patient will utilize speech intelligibility strategies at the sentence level with Mod I to achieve~90% intelligibility.  Skilled Therapeutic Interventions: Skilled treatment session focused on cognitive goals. SLP facilitated session by providing overall Mod-Max A verbal cues for use of "association" as a memory compensatory strategy during a novel task for recall of specific categories. Mod verbal and visual cues were also needed for patient to recall ~3-4 items that were generated for each category after a 15 minute delay.  Throughout task, patient also wrote information down to maximize recall, however, information was somewhat disorganized which made it difficult for patient to utilize as an effective memory compensatory strategy. Patient was overall 100% intelligible at the sentence level throughout session. Patient left upright in wheelchair with husband present. Continue with current plan of care.      Pain No/Denies Pain   Therapy/Group: Individual Therapy  Rajvi Armentor 06/21/2023, 3:18 PM

## 2023-06-21 NOTE — Progress Notes (Signed)
Patient ID: Virginia [name], female   DOB: 1956/06/16, 67 y.o.   MRN: [id number]  SW received updates from [facility] that pt is active with agency for HHPT/OT. SW will confirm [facility] needs.   Cecile Sheerer, MSW, LCSWA Office: [phone] Cell: [phone] Fax: [phone]

## 2023-06-22 LAB — GLUCOSE, CAPILLARY
Glucose-Capillary: 115 mg/dL — ABNORMAL HIGH (ref 70–99)
Glucose-Capillary: 97 mg/dL (ref 70–99)
Glucose-Capillary: 117 mg/dL — ABNORMAL HIGH (ref 70–99)
Glucose-Capillary: 130 mg/dL — ABNORMAL HIGH (ref 70–99)

## 2023-06-22 LAB — BASIC METABOLIC PANEL
Anion gap: 10 (ref 5–15)
BUN: 22 mg/dL (ref 8–23)
CO2: 25 mmol/L (ref 22–32)
Calcium: 8.4 mg/dL — ABNORMAL LOW (ref 8.9–10.3)
Chloride: 107 mmol/L (ref 98–111)
Creatinine, Ser: 1.37 mg/dL — ABNORMAL HIGH (ref 0.44–1.00)
GFR, Estimated: 43 mL/min — ABNORMAL LOW (ref >60)
Glucose, Bld: 123 mg/dL — ABNORMAL HIGH (ref 70–99)
Potassium: 4.6 mmol/L (ref 3.5–5.1)
Sodium: 142 mmol/L (ref 135–145)

## 2023-06-22 LAB — MAGNESIUM: Magnesium: 2 mg/dL (ref 1.7–2.4)

## 2023-06-22 MED ORDER — SPIRONOLACTONE 12.5 MG HALF TABLET
12.5000 mg | ORAL_TABLET | Freq: Every day | ORAL | Status: DC
  Administered 2023-06-23 – 2023-06-28 (×6): 12.5 mg via ORAL
  Filled 2023-06-22 (×6): qty 1

## 2023-06-22 NOTE — Progress Notes (Signed)
Occupational Therapy Session Note  Patient Details  Name: Virginia [name] MRN: [id number] Date of Birth: 03/26/1956  Today's Date: 06/22/2023 OT Individual Time: 3295-1884 OT Individual Time Calculation (min): 40 min    Short Term Goals: Week 2:  OT Short Term Goal 1 (Week 2): Pt will tolerate standing for 4 minutes within BADL task for functional endurance OT Short Term Goal 2 (Week 2): Patient will perform 1 step of toileting task OT Short Term Goal 3 (Week 2): Patient will don shirt with set-up A  Skilled Therapeutic Interventions/Progress Updates:  Skilled OT intervention completed with focus on functional transfers, dynamic balance, LUE NMR. Pt received upright in bed, agreeable to session. Dizziness reported and intermittent discomfort in back; pre-medicated. OT offered rest breaks and repositioning throughout for pain reduction.  Pt declined self-care needs, reporting having already went to bathroom with husbands assist. Transitioned to EOB with supervision. No increase in dizziness; pt reporting this is baseline. CGA sit > stand with RW then CGA stand pivot > w/c.   Pt brushed hair with supervision, then OT braided pt's hair to boost moral and therapeutic relationship of trust. Transported dependently in w/c <> gym.  Pt participated in the following dynamic standing balance and endurance tasks to promote independence and safety during BADLs and functional mobility: -placing and retrieving squigz from long mirror. CGA sit > stand with RW, and CGA/min A needed for balance; placed with RUE and retrieved with LUE as LUE with ataxic movements. Seated rest needed for fatigue  Pt remained seated in w/c with husband present and NT changing bed linens and with all needs in reach at end of session.   Therapy Documentation Precautions:  Precautions Precautions: Fall Restrictions Weight Bearing Restrictions: No    Therapy/Group: Individual Therapy  Melvyn Novas, MS,  OTR/L  06/22/2023, 11:55 AM

## 2023-06-22 NOTE — Plan of Care (Signed)
  Problem: RH BOWEL ELIMINATION Goal: RH STG MANAGE BOWEL WITH ASSISTANCE Description: STG Manage Bowel with toileting Assistance. Outcome: Progressing Goal: RH STG MANAGE BOWEL W/MEDICATION W/ASSISTANCE Description: STG Manage Bowel with Medication with mod I Assistance. Outcome: Progressing   Problem: RH BLADDER ELIMINATION Goal: RH STG MANAGE BLADDER WITH ASSISTANCE Description: STG Manage Bladder With toileting Assistance Outcome: Progressing Goal: RH STG MANAGE BLADDER WITH MEDICATION WITH ASSISTANCE Description: STG Manage Bladder With Medication With mod I Assistance. Outcome: Progressing   Problem: RH SKIN INTEGRITY Goal: RH STG SKIN FREE OF INFECTION/BREAKDOWN Description: Manage w min assist Outcome: Progressing Goal: RH STG MAINTAIN SKIN INTEGRITY WITH ASSISTANCE Description: STG Maintain Skin Integrity With min Assistance. Outcome: Progressing   Problem: RH SAFETY Goal: RH STG ADHERE TO SAFETY PRECAUTIONS W/ASSISTANCE/DEVICE Description: STG Adhere to Safety Precautions With cues Assistance/Device. Outcome: Progressing   Problem: RH PAIN MANAGEMENT Goal: RH STG PAIN MANAGED AT OR BELOW PT'S PAIN GOAL Description: < 4 with prns Outcome: Progressing   Problem: RH KNOWLEDGE DEFICIT GENERAL Goal: RH STG INCREASE KNOWLEDGE OF SELF CARE AFTER HOSPITALIZATION Description: Patient and spouse will be able to manage care at discharge with medications and dietary modification using educational resources independently Outcome: Progressing   Problem: Consults Goal: RH GENERAL PATIENT EDUCATION Description: See Patient Education module for education specifics. Outcome: Progressing

## 2023-06-22 NOTE — Progress Notes (Signed)
PROGRESS NOTE   Subjective/Complaints: No events overnight.  No acute complaints.   Vitals stable with ongoing mild hypertension. AKI on AM labs, K and NA WNL.  Patient with again increased dizziness and lethargy today; no gross focal deficits but some worsening of overall symptoms. Discussed monitorring vs. Repeat imaging today; patient opts for watchful waiting.  Husband concerned she may have had 2 mg decadron yesterday; reviewed nursing notes only 1 mg givenm  LBM 10/10   ROS: Ongoing dizziness, lethargy. Pertinent positives per HPI above.  Patient denies fever, rash, sore throat, blurred vision,  nausea, vomiting, diarrhea, cough, shortness of breath or chest pain, joint or back/neck pain, headache, or mood change.    Objective:   No results found. Recent Labs    06/21/23 1323  WBC 9.3  HGB 8.2*  HCT 25.4*  PLT 230     Recent Labs    06/21/23 1323 06/22/23 0702  NA 139 142  K 4.7 4.6  CL 103 107  CO2 27 25  GLUCOSE 111* 123*  BUN 28* 22  CREATININE 0.96 1.37*  CALCIUM 8.7* 8.4*     Intake/Output Summary (Last 24 hours) at 06/22/2023 1117 Last data filed at 06/21/2023 1545 Gross per 24 hour  Intake 240 ml  Output --  Net 240 ml     Pressure Injury 06/03/23 Buttocks Right Deep Tissue Pressure Injury - Purple or maroon localized area of discolored intact skin or blood-filled blister due to damage of underlying soft tissue from pressure and/or shear. purple (Active)  06/03/23 1740  Location: Buttocks  Location Orientation: Right  Staging: Deep Tissue Pressure Injury - Purple or maroon localized area of discolored intact skin or blood-filled blister due to damage of underlying soft tissue from pressure and/or shear.  Wound Description (Comments): purple  Present on Admission: Yes     Pressure Injury 06/03/23 Buttocks Left Deep Tissue Pressure Injury - Purple or maroon localized area of discolored  intact skin or blood-filled blister due to damage of underlying soft tissue from pressure and/or shear. (Active)  06/03/23   Location: Buttocks  Location Orientation: Left  Staging: Deep Tissue Pressure Injury - Purple or maroon localized area of discolored intact skin or blood-filled blister due to damage of underlying soft tissue from pressure and/or shear.  Wound Description (Comments):   Present on Admission: Yes    Physical Exam: Vital Signs Blood pressure (!) 161/87, pulse 85, temperature (!) 97.5 F (36.4 C), temperature source Oral, resp. rate 18, height 5\' 2"  (1.575 m), weight 73.8 kg, SpO2 99%.  Constitutional: No distress . Vital signs reviewed. Sitting up in bed.  Eyes: Horizontal nystagmus with gaze inb all directions -worse today. EOMI. PERRLA Cardiovascular: RRR without murmur. No JVD  . Edema resolved.  Respiratory/Chest: CTA Bilaterally without wheezes or rales.  Comfortable on room air.    GI/Abdomen: BS +, non-tender, non-distended Ext: no clubbing, cyanosis, or edema Psych: pleasant and cooperative; mildly lethargic Skin: C/D/I. No apparent lesions.  Bilateral buttocks bruising as below - imprivuing          Neurologic exam: alert and attentive Cognition: AAO to person, place, time  +Mild cognitive, memory deficits -mild delayed  recall - somewhat worse today Insight: reasonable  insight into current condition.  Mood: Pleasant affect, appropriate mood.  Strength: Antigravity against resistance bilateral upper and lower extremities, with some mild LUE weakness 5-/5.  LUE ataxia -ongoing, worse over last 2 days. None in lower extremities.    Assessment/Plan: 1. Functional deficits which require 3+ hours per day of interdisciplinary therapy in a comprehensive inpatient rehab setting. Physiatrist is providing close team supervision and 24 hour management of active medical problems listed below. Physiatrist and rehab team continue to assess barriers to  discharge/monitor patient progress toward functional and medical goals  Care Tool:  Bathing    Body parts bathed by patient: Right arm, Left arm, Chest, Abdomen, Right upper leg, Left upper leg, Face, Front perineal area   Body parts bathed by helper: Left lower leg, Right lower leg, Buttocks, Front perineal area     Bathing assist Assist Level: Moderate Assistance - Patient 50 - 74%     Upper Body Dressing/Undressing Upper body dressing   What is the patient wearing?: Pull over shirt    Upper body assist Assist Level: Contact Guard/Touching assist    Lower Body Dressing/Undressing Lower body dressing      What is the patient wearing?: Underwear/pull up, Pants     Lower body assist Assist for lower body dressing: Moderate Assistance - Patient 50 - 74%     Toileting Toileting    Toileting assist Assist for toileting: Moderate Assistance - Patient 50 - 74%     Transfers Chair/bed transfer  Transfers assist  Chair/bed transfer activity did not occur: Safety/medical concerns  Chair/bed transfer assist level: Minimal Assistance - Patient > 75%     Locomotion Ambulation   Ambulation assist      Assist level: 2 helpers Assistive device: Walker-rolling Max distance: 50'   Walk 10 feet activity   Assist  Walk 10 feet activity did not occur: Safety/medical concerns  Assist level: Minimal Assistance - Patient > 75% Assistive device: Walker-rolling   Walk 50 feet activity   Assist Walk 50 feet with 2 turns activity did not occur: Safety/medical concerns  Assist level: 2 helpers Assistive device: Walker-rolling    Walk 150 feet activity   Assist Walk 150 feet activity did not occur: Safety/medical concerns         Walk 10 feet on uneven surface  activity   Assist Walk 10 feet on uneven surfaces activity did not occur: Safety/medical concerns         Wheelchair     Assist Is the patient using a wheelchair?: Yes Type of Wheelchair:  Manual    Wheelchair assist level: Dependent - Patient 0%      Wheelchair 50 feet with 2 turns activity    Assist        Assist Level: Dependent - Patient 0%   Wheelchair 150 feet activity     Assist      Assist Level: Dependent - Patient 0%   Blood pressure (!) 161/87, pulse 85, temperature (!) 97.5 F (36.4 C), temperature source Oral, resp. rate 18, height 5\' 2"  (1.575 m), weight 73.8 kg, SpO2 99%.   Medical Problem List and Plan: 1. Functional deficits secondary to debility after cardiac arrest and multiple related complications             -patient may shower             -ELOS/Goals: 12-14 days, min assist with PT, OT (now CGA/SPV goals), SLP - discharge  date 10/22             -pt has episodes of fatigue/somnolence at baseline per husband. He says she does better in the later morning and early afternoon.   -Continue CIR therapies including PT, OT, and SLP     - 10/10: Reached out to palliative, requesting they touch base with patient and family again prior to discharge 10-22 regarding lingering questions on caregiver role and end-of-life  10/12: Worsening overall dizziness/vertigo and ataxia, progressive over last 2 days; patient opts to wait 1 more day for repeat imaging to evaluate  2.  Antithrombotics: -DVT/anticoagulation:  Mechanical:  Antiembolism stockings, knee (TED hose) Bilateral lower extremities (age indeterminate left posterior tib DVT). 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             -antiplatelet therapy: none   3. Pain Management: Tylenol, Norco, oxycodone as needed -Lidoderm patch             -Oxycontin 10 mg q 12 hours             -observe for further sedation. Was somnolent this morning after receiving pain medication.   -10-3: Continue OxyContin 10 mg every 12 hours (is on 13.5 mg every 12 at home) just short acting to home  regimen oxycodone 5 mg every 4 hours as needed     - 10/8: Add PRN methocarbamol for muscle soreness -  not used    10/5-6 pain appears controlled.  4. Mood/Behavior/Sleep: LCSW to evaluate and provide emotional support             -antipsychotic agents: Zyprexa 2.5 mg q HS-at home dose             -Xanax prn -at home dose   5. Neuropsych/cognition: This patient is capable of making decisions on his own behalf.   6. Skin/Wound Care: Routine skin care checks   -10-1: Family inquiring about protein supplementation, will look into supplements given on inpatient.   7. Fluids/Electrolytes/Nutrition: Routine Is and Os and follow-up chemistries   -10-1: See below, potassium borderline, magnesium repleted today.  Rechecking in AM.   - 10-2: Electrolytes repleted, labs normal, monitor  - 10/4: Encourage p.o. fluids due to tachycardia today; BMP in a.m.  -10/6 Potassium 3.2--increase supplement  -BUN/Cr holding fairly steady--continue to push fluids -10-7: Potassium 3.8.  Continue current supplement.  BUN/creatinine stable.  - labs in AM for BMP, Mag given soreness -stable 10/9, repeat pending 10/11    8: Hypertension: monitor TID and prn (home Norvasc held)   - Remains normotensive, monitor with addition of spironolactone/ BMP  - change to 10/11 given symptoms   - 10/12: BMP with AKI; increase in BP may be from steroids vs. 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  Will monitor for any functional decline, and may reach out to neuro-oncology for  prolonged taper depending on symptom severity.  10/10: Finished Decadron  taper, discussed with family notifying provider if any worsening symptoms of dizziness, ataxia, or lethargy with this.  If needed to resume, will reach out to Dr. Barbaraann Cao   10: Bilateral rib fractures due to chest compressions -pain control as above   11: V-fib cardiac arrest:  -continue Pacerone 400 mg daily for one week, then 200 mg daily on 10/08             -not a candidate for invasive intervention             -K > 4; Mg > 2 -potassium 3.8 on admission labs, recheck in AM.  Magnesium 1.6, replete 2 g IV today and then start 400 mg twice daily due to recurrent drops.  Recheck in AM.             -if patient needs diuretics outpatient due to lower extremity edema then cardiology will consider changing to a potassium sparing diuretic such as spironolactone 25 mg daily along with some potassium supplementation and magnesium             -follow-up with Dr. Lynnette Caffey 3 weeks after D/C from CIR    - Daily weights - reodered 10/11  -10/6 supplement K+  -10-9: Reminding nursing to perform daily weights.will have cards eval today to initiate diuretic and adjust meds given ongoing swelling and mild hypotension  10/10: Cardiology resumed spironolactone 25 mg daily due to peripheral edema, appears to be helping.     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 fluids, labs stable, f/u monday  10-7: Creatinine stabilizing at 1.1-1.2; unchange d10-9;  - 10/11-10/12 0.96-> 1.37 Cr with improved BUN; reduce spirinolactone to 12.5 mg daily.   17: Anemia: ~3 gram drop in hemoglobin/no overt bleeding             -follow-up CBC. Most recently has been ~7.7; stable on admission labs             -check Hemoccult X 3 -pending             -has hemorrhoids, constipated             -continue PPI, Pepcid   - hgb stable at 7.8 on 10/5; greater than 8 10/7  18: DM: A1c = 7.3%, also on Decadron             -start CBGs QID and SSI             -carb modified diet as tolerated  Blood sugars well-controlled, monitor  -10-3: Mild elevation blood sugars over the last few days, likely with improved diet; not on any diabetic regimen as outpatient.  Monitor 1 day and consider initiating low-dose metformin if no improvement.   - 10-4: Patient working on dietary adjustments, mildly improved.  Continue to monitor Recent Labs    06/21/23 1659 06/21/23 2057 06/22/23 0556  GLUCAP 121* 136* 115*    10/6 sugars have been elevated due to steroids--decadron down to 1mg  daily and readings improved except for readings around dinner time   -continue current cbg checks and SSI for now   -may be able to stop these soon 10-11: Stable, occassional 140s but overall <120. 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 Has chronic intermittent dizziness>>meclizine prn   - well controlled, no current complaints  -10/5 scheduled meclizine 25mg  tid per pt request -> increase to 50 mg TID 10/11; this was OP dose  - Had prior rxn to  scopalamine  - 10/11: Symptoms worsening s/p steroid taper, messaged Dr. Barbaraann Cao regarding resuming 1 mg daily until OP follow up --> he is in agreement with this, resumed 10/11   - 10/12: Symptoms worsening overall; see above, if continuing tomorrow will re-image    20: External hemorrhoids: continue topical hydrocortisone 2.5%; reordered today, as suppository was prior ordered.   21: Intermittent constipation: check for impaction then decide on appropriate regimen  10-1: Multiple small bowel movements overnight, add MiraLAX 17 mg daily  10-2: Small brown bowel movement this a.m., mushy consistency - improved  Last bowel movement 10/4--hopeful for BM today 10/6  Small bowel movement 10-7, increase MiraLAX to twice daily and add as needed Senokot per family request  10-12: Last BM      LOS: 12 days A FACE TO FACE EVALUATION WAS PERFORMED  Virginia [name] 06/22/2023, 11:17 AM

## 2023-06-22 NOTE — Progress Notes (Signed)
Speech Language Pathology Daily Session Note  Patient Details  Name: Virginia [name] MRN: [id number] Date of Birth: [birth year]  Today's Date: 06/22/2023 SLP Individual Time: 2952-8413 SLP Individual Time Calculation (min): 38 min  Short Term Goals: Week 2: SLP Short Term Goal 1 (Week 2): Patient will solve basic environmental problems in 4/5 opportunities given minA. SLP Short Term Goal 2 (Week 2): Patient will attend to the left during functional vision-based tasks (i.e. reading, drawing, etc.) given minA. SLP Short Term Goal 3 (Week 2): Patient will utilize external memory and orientation aids to recall time, day of the week, and basic daily information given minA. SLP Short Term Goal 4 (Week 2): Patient will demonstrate emergent awareness of problems during basic functional tasks in 4/5 opportunities given modA. SLP Short Term Goal 5 (Week 2): Patient will utilize speech intelligibility strategies at the sentence level with Mod I to achieve~90% intelligibility.  Skilled Therapeutic Interventions: SLP conducted skilled therapy session targeting cognitive retraining goals. Patient received upright in bed with husband present at bedside. Between husband and patient, SLP updated to current changes in patient status including patient's feelings of dizziness and fatigue. MD entered during SLP session to evaluate these reports with MD closely monitoring. SLP guided patient through focus activity with patient endorsing she is having exceedingly difficult time focusing this session compared to previous. Patient benefited from minA to complete task including scanning to the left side of the page, attending to detail, and following basic one step directions. Patient benefited from modA to recognize problems/errors during basic functional task. Patient was left in lowered bed with call bell in reach and bed alarm set. SLP will continue to target goals per plan of care.       Pain Pain Assessment Pain  Scale: 0-10 Pain Score: 0-No pain Pain Type: Chronic pain Pain Location: Back Pain Orientation: Mid Pain Descriptors / Indicators: Aching Pain Frequency: Constant Pain Onset: On-going Pain Intervention(s): Medication (See eMAR)  Therapy/Group: Individual Therapy  Jeannie Done, M.A., CCC-SLP  Yetta Barre 06/22/2023, 2:19 PM

## 2023-06-23 LAB — GLUCOSE, CAPILLARY
Glucose-Capillary: 105 mg/dL — ABNORMAL HIGH (ref 70–99)
Glucose-Capillary: 114 mg/dL — ABNORMAL HIGH (ref 70–99)
Glucose-Capillary: 229 mg/dL — ABNORMAL HIGH (ref 70–99)
Glucose-Capillary: 276 mg/dL — ABNORMAL HIGH (ref 70–99)
Glucose-Capillary: 112 mg/dL — ABNORMAL HIGH (ref 70–99)

## 2023-06-23 NOTE — Progress Notes (Signed)
Occupational Therapy Session Note  Patient Details  Name: Virginia [name] MRN: [id number] Date of Birth: 04/28/1956  Session 1: {CHL IP REHAB OT TIME CALCULATIONS:304400400} Session 2: {CHL IP REHAB OT TIME CALCULATIONS:304400400}  Short Term Goals: Week 2:  OT Short Term Goal 1 (Week 2): Pt will tolerate standing for 4 minutes within BADL task for functional endurance OT Short Term Goal 2 (Week 2): Patient will perform 1 step of toileting task OT Short Term Goal 3 (Week 2): Patient will don shirt with set-up A  Skilled Therapeutic Interventions/Progress Updates:  Session 1:   Patient agreeable to participate in OT session. Reports *** pain level.   Patient participated in skilled OT session focusing on ***. Therapist facilitated/assessed/developed/educated/integrated/elicited *** in order to improve/facilitate/promote   Session 2: Patient agreeable to participate in OT session. Reports *** pain level.   Patient participated in skilled OT session focusing on ***. Therapist facilitated/assessed/developed/educated/integrated/elicited *** in order to improve/facilitate/promote    Therapy Documentation Precautions:  Precautions Precautions: Fall Restrictions Weight Bearing Restrictions: No   Therapy/Group: Individual Therapy  [name], OTR/L,CBIS  Supplemental OT - [facility] and [facility] Secure Chat Preferred   06/23/2023, 9:03 PM

## 2023-06-23 NOTE — Progress Notes (Signed)
PROGRESS NOTE   Subjective/Complaints: No events overnight.  Reports symptoms of dizziness and fatigue are mildly improved today.  Discussed risks versus benefits of repeat imaging, likelihood that it would not significantly change treatment plan; she opts to hold off on further imaging at this time.  No other concerns. Vitals stable, 8/10 back pain overnight.  LBM 10/12   ROS: Ongoing dizziness, lethargy.-Improved Pertinent positives per HPI above.  Patient denies fever, rash, sore throat, blurred vision,  nausea, vomiting, diarrhea, cough, shortness of breath or chest pain, joint or back/neck pain, headache, or mood change.    Objective:   No results found. Recent Labs    06/21/23 1323  WBC 9.3  HGB 8.2*  HCT 25.4*  PLT 230     Recent Labs    06/21/23 1323 06/22/23 0702  NA 139 142  K 4.7 4.6  CL 103 107  CO2 27 25  GLUCOSE 111* 123*  BUN 28* 22  CREATININE 0.96 1.37*  CALCIUM 8.7* 8.4*     Intake/Output Summary (Last 24 hours) at 06/23/2023 1119 Last data filed at 06/22/2023 1800 Gross per 24 hour  Intake 118 ml  Output --  Net 118 ml     Pressure Injury 06/03/23 Buttocks Right Deep Tissue Pressure Injury - Purple or maroon localized area of discolored intact skin or blood-filled blister due to damage of underlying soft tissue from pressure and/or shear. purple (Active)  06/03/23 1740  Location: Buttocks  Location Orientation: Right  Staging: Deep Tissue Pressure Injury - Purple or maroon localized area of discolored intact skin or blood-filled blister due to damage of underlying soft tissue from pressure and/or shear.  Wound Description (Comments): purple  Present on Admission: Yes     Pressure Injury 06/03/23 Buttocks Left Deep Tissue Pressure Injury - Purple or maroon localized area of discolored intact skin or blood-filled blister due to damage of underlying soft tissue from pressure and/or shear.  (Active)  06/03/23   Location: Buttocks  Location Orientation: Left  Staging: Deep Tissue Pressure Injury - Purple or maroon localized area of discolored intact skin or blood-filled blister due to damage of underlying soft tissue from pressure and/or shear.  Wound Description (Comments):   Present on Admission: Yes    Physical Exam: Vital Signs Blood pressure 133/84, pulse 90, temperature 98.3 F (36.8 C), temperature source Oral, resp. rate 18, height 5\' 2"  (1.575 m), weight 73.8 kg, SpO2 97%.  Constitutional: No distress . Vital signs reviewed. Sitting up in bed.  Eyes: Horizontal nystagmus with gaze inb all directions -stable from prior exam. EOMI. PERRLA Cardiovascular: RRR without murmur. No JVD  . Edema resolved.  Respiratory/Chest: CTA Bilaterally without wheezes or rales.  Comfortable on room air.    GI/Abdomen: BS +, non-tender, non-distended Ext: no clubbing, cyanosis, or edema Psych: pleasant and cooperative; mildly lethargic Skin: C/D/I. No apparent lesions.  Bilateral buttocks bruising as below-not examined today          Neurologic exam: alert and attentive Cognition: AAO to person, place, time  +Mild cognitive, memory deficits -mild delayed recall -improved from yesterday, stable overall Insight: reasonable  insight into current condition.  Mood: Pleasant affect, appropriate mood.  Strength: Antigravity against resistance bilateral upper and lower extremities, with some mild LUE weakness 5-/5.  LUE ataxia -ongoing, worse 10/11-12; unchanged 10-13   Assessment/Plan: 1. Functional deficits which require 3+ hours per day of interdisciplinary therapy in a comprehensive inpatient rehab setting. Physiatrist is providing close team supervision and 24 hour management of active medical problems listed below. Physiatrist and rehab team continue to assess barriers to discharge/monitor patient progress toward functional and medical goals  Care Tool:  Bathing    Body  parts bathed by patient: Right arm, Left arm, Chest, Abdomen, Right upper leg, Left upper leg, Face, Front perineal area   Body parts bathed by helper: Left lower leg, Right lower leg, Buttocks, Front perineal area     Bathing assist Assist Level: Moderate Assistance - Patient 50 - 74%     Upper Body Dressing/Undressing Upper body dressing   What is the patient wearing?: Pull over shirt    Upper body assist Assist Level: Contact Guard/Touching assist    Lower Body Dressing/Undressing Lower body dressing      What is the patient wearing?: Underwear/pull up, Pants     Lower body assist Assist for lower body dressing: Moderate Assistance - Patient 50 - 74%     Toileting Toileting    Toileting assist Assist for toileting: Moderate Assistance - Patient 50 - 74%     Transfers Chair/bed transfer  Transfers assist  Chair/bed transfer activity did not occur: Safety/medical concerns  Chair/bed transfer assist level: Minimal Assistance - Patient > 75%     Locomotion Ambulation   Ambulation assist      Assist level: 2 helpers Assistive device: Walker-rolling Max distance: 50'   Walk 10 feet activity   Assist  Walk 10 feet activity did not occur: Safety/medical concerns  Assist level: Minimal Assistance - Patient > 75% Assistive device: Walker-rolling   Walk 50 feet activity   Assist Walk 50 feet with 2 turns activity did not occur: Safety/medical concerns  Assist level: 2 helpers Assistive device: Walker-rolling    Walk 150 feet activity   Assist Walk 150 feet activity did not occur: Safety/medical concerns         Walk 10 feet on uneven surface  activity   Assist Walk 10 feet on uneven surfaces activity did not occur: Safety/medical concerns         Wheelchair     Assist Is the patient using a wheelchair?: Yes Type of Wheelchair: Manual    Wheelchair assist level: Dependent - Patient 0%      Wheelchair 50 feet with 2 turns  activity    Assist        Assist Level: Dependent - Patient 0%   Wheelchair 150 feet activity     Assist      Assist Level: Dependent - Patient 0%   Blood pressure 133/84, pulse 90, temperature 98.3 F (36.8 C), temperature source Oral, resp. rate 18, height 5\' 2"  (1.575 m), weight 73.8 kg, SpO2 97%.   Medical Problem List and Plan: 1. Functional deficits secondary to debility after cardiac arrest and multiple related complications             -patient may shower             -ELOS/Goals: 12-14 days, min assist with PT, OT (now CGA/SPV goals), SLP - discharge date 10/22             -pt has episodes of fatigue/somnolence at baseline per husband. He says she does  better in the later morning and early afternoon.   -Continue CIR therapies including PT, OT, and SLP     - 10/10: Reached out to palliative, requesting they touch base with patient and family again prior to discharge 10-22 regarding lingering questions on caregiver role and end-of-life  10/12: Worsening overall dizziness/vertigo and ataxia, progressive over last 2 days; patient opts to wait 1 more day for repeat imaging to evaluate -self reports symptoms improved 10-13  2.  Antithrombotics: -DVT/anticoagulation:  Mechanical:  Antiembolism stockings, knee (TED hose) Bilateral lower extremities (age indeterminate left posterior tib DVT). 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             -antiplatelet therapy: none   3. Pain Management: Tylenol, Norco, oxycodone as needed -Lidoderm patch             -Oxycontin 10 mg q 12 hours             -observe for further sedation. Was somnolent this morning after receiving pain medication.   -10-3: Continue OxyContin 10 mg every 12 hours (is on 13.5 mg every 12 at home) just short acting to home regimen oxycodone 5 mg every 4 hours as needed     - 10/8: Add PRN  methocarbamol for muscle soreness -  not used    10/5-6 pain appears controlled.  4. Mood/Behavior/Sleep: LCSW to evaluate and provide emotional support             -antipsychotic agents: Zyprexa 2.5 mg q HS-at home dose             -Xanax prn -at home dose   5. Neuropsych/cognition: This patient is capable of making decisions on his own behalf.   6. Skin/Wound Care: Routine skin care checks   -10-1: Family inquiring about protein supplementation, will look into supplements given on inpatient.   7. Fluids/Electrolytes/Nutrition: Routine Is and Os and follow-up chemistries   -10-1: See below, potassium borderline, magnesium repleted today.  Rechecking in AM.   - 10-2: Electrolytes repleted, labs normal, monitor  - 10/4: Encourage p.o. fluids due to tachycardia today; BMP in a.m.  -10/6 Potassium 3.2--increase supplement  -BUN/Cr holding fairly steady--continue to push fluids -10-7: Potassium 3.8.  Continue current supplement.  BUN/creatinine stable.  - labs in AM for BMP, Mag given soreness -stable 1/9; AKI 10-12; repeat Monday    8: Hypertension: monitor TID and prn (home Norvasc held)   - Remains normotensive, monitor with addition of spironolactone/ BMP  - change to 10/11 given symptoms   - 10/12: BMP with AKI; increase in BP may be from steroids vs. 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  Will monitor for any functional decline, and may reach out to neuro-oncology for prolonged taper depending on symptom severity.  10/10: Finished Decadron taper, discussed with family notifying provider if any  worsening symptoms of dizziness, ataxia, or  lethargy with this.  If needed to resume, will reach out to Dr. Barbaraann Cao   10: Bilateral rib fractures due to chest compressions -pain control as above   11: V-fib cardiac arrest:  -continue Pacerone 400 mg daily for one week, then 200 mg daily on 10/08             -not a candidate for invasive intervention             -K > 4; Mg > 2 -potassium 3.8 on admission labs, recheck in AM.  Magnesium 1.6, replete 2 g IV today and then start 400 mg twice daily due to recurrent drops.  Recheck in AM.             -if patient needs diuretics outpatient due to lower extremity edema then cardiology will consider changing to a potassium sparing diuretic such as spironolactone 25 mg daily along with some potassium supplementation and magnesium             -follow-up with Dr. Lynnette Caffey 3 weeks after D/C from CIR    - Daily weights - reodered 10/11  -10/6 supplement K+  -10-9: Reminding nursing to perform daily weights.will have cards eval today to initiate diuretic and adjust meds given ongoing swelling and mild hypotension  10/10: Cardiology resumed spironolactone 25 mg daily due to peripheral edema, appears to be helping.     10/12: reduce to 12.5 mg daily d/t AKI  12: Stage IV lung cancer/mets to brain:             -continue Keppra 1000 mg BID             -follow-up with Dr. 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 fluids, labs stable, f/u monday  10-7: Creatinine stabilizing at 1.1-1.2; unchange d10-9;  - 10/11-10/12 0.96-> 1.37 Cr with improved BUN; reduce spirinolactone to 12.5 mg daily.  - repeat pending Monday  17: Anemia: ~3 gram drop in hemoglobin/no overt bleeding             -follow-up CBC. Most recently has been ~7.7; stable on admission labs             -check Hemoccult X 3 -pending             -has hemorrhoids, constipated             -continue PPI, Pepcid   - hgb stable at 7.8 on 10/5; greater than 8 10/7   18: DM: A1c = 7.3%, also on Decadron             -start CBGs QID and SSI             -carb modified diet  as tolerated  Blood sugars well-controlled, monitor  -10-3: Mild elevation blood sugars over the last few days, likely with improved diet; not on any diabetic regimen as outpatient.  Monitor 1 day and consider initiating low-dose metformin if no improvement.   - 10-4: Patient working on dietary adjustments, mildly improved.  Continue to monitor Recent Labs    06/22/23 1729 06/22/23 2055 06/23/23 0550  GLUCAP 117* 130* 105*    10/6 sugars have been elevated due to steroids--decadron down to 1mg  daily and readings improved except for readings around dinner time   -continue current cbg checks and SSI for now   -may be able to stop these soon 10-11: Stable, occassional 140s but overall <120. Can consider metformin if renal function stable on labs Monday    19: Prior post fossa craniotomy for tumor resection 12/02/2020: resection of progressive cerebellar mass by Dr. Venetia Maxon. 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 Has chronic intermittent dizziness>>meclizine prn   - well controlled, no current complaints  -10/5 scheduled meclizine 25mg  tid per pt request -> increase to 50 mg TID 10/11; this was OP dose  - Had prior rxn to scopalamine  - 10/11: Symptoms worsening s/p steroid taper, messaged Dr. Barbaraann Cao regarding resuming 1 mg daily until OP follow up  --> he is in agreement with this, resumed 10/11   - 10/12: Symptoms worsening overall; see above, if continuing tomorrow will re-image    - 10/13: Improved, defer further imaging to neuro-oncology/oncology OP   20: External hemorrhoids: continue topical hydrocortisone 2.5%; reordered today, as suppository was prior ordered.   21: Intermittent constipation: check for impaction then decide on appropriate regimen  10-1: Multiple small bowel movements overnight, add MiraLAX 17 mg daily  10-2: Small brown bowel movement this a.m., mushy consistency - improved  Last bowel movement 10/4--hopeful for BM today 10/6  Small bowel movement 10-7, increase MiraLAX to twice daily and add as needed Senokot per family request  10-12: Last BM      LOS: 13 days A FACE TO FACE EVALUATION WAS PERFORMED  Virginia [name] 06/23/2023, 11:19 AM

## 2023-06-24 DIAGNOSIS — I10 Essential (primary) hypertension: Secondary | ICD-10-CM

## 2023-06-24 DIAGNOSIS — G4700 Insomnia, unspecified: Secondary | ICD-10-CM

## 2023-06-24 DIAGNOSIS — Z794 Long term (current) use of insulin: Secondary | ICD-10-CM

## 2023-06-24 DIAGNOSIS — K5900 Constipation, unspecified: Secondary | ICD-10-CM

## 2023-06-24 LAB — BASIC METABOLIC PANEL
Anion gap: 7 (ref 5–15)
BUN: 32 mg/dL — ABNORMAL HIGH (ref 8–23)
CO2: 23 mmol/L (ref 22–32)
Calcium: 8.3 mg/dL — ABNORMAL LOW (ref 8.9–10.3)
Chloride: 110 mmol/L (ref 98–111)
Creatinine, Ser: 1.13 mg/dL — ABNORMAL HIGH (ref 0.44–1.00)
GFR, Estimated: 54 mL/min — ABNORMAL LOW (ref >60)
Glucose, Bld: 106 mg/dL — ABNORMAL HIGH (ref 70–99)
Potassium: 4.2 mmol/L (ref 3.5–5.1)
Sodium: 140 mmol/L (ref 135–145)

## 2023-06-24 LAB — GLUCOSE, CAPILLARY
Glucose-Capillary: 102 mg/dL — ABNORMAL HIGH (ref 70–99)
Glucose-Capillary: 98 mg/dL (ref 70–99)
Glucose-Capillary: 103 mg/dL — ABNORMAL HIGH (ref 70–99)
Glucose-Capillary: 136 mg/dL — ABNORMAL HIGH (ref 70–99)

## 2023-06-24 LAB — CBC
HCT: 25.6 % — ABNORMAL LOW (ref 36.0–46.0)
Hemoglobin: 8.3 g/dL — ABNORMAL LOW (ref 12.0–15.0)
MCH: 34.3 pg — ABNORMAL HIGH (ref 26.0–34.0)
MCHC: 32.4 g/dL (ref 30.0–36.0)
MCV: 105.8 fL — ABNORMAL HIGH (ref 80.0–100.0)
Platelets: 224 10*3/uL (ref 150–400)
RBC: 2.42 MIL/uL — ABNORMAL LOW (ref 3.87–5.11)
RDW: 19.9 % — ABNORMAL HIGH (ref 11.5–15.5)
WBC: 8.9 10*3/uL (ref 4.0–10.5)
nRBC: 0 % (ref 0.0–0.2)

## 2023-06-24 LAB — MAGNESIUM: Magnesium: 1.8 mg/dL (ref 1.7–2.4)

## 2023-06-24 MED ORDER — MELATONIN 3 MG PO TABS: 3.0000 mg | ORAL_TABLET | Freq: Every evening | ORAL | Status: DC | PRN

## 2023-06-24 NOTE — Progress Notes (Signed)
Physical Therapy Session Note  Patient Details  Name: Virginia [name] MRN: [id number] Date of Birth: Jul 20, 1956  Today's Date: 06/24/2023 PT Individual Time: 1110-1205 PT Individual Time Calculation (min): 55 min   Short Term Goals: Week 2:  PT Short Term Goal 1 (Week 2): Pt will ambulate with RW min assist 50' PT Short Term Goal 2 (Week 2): Pt will complete up/down 4 steps with BHRs min assist PT Short Term Goal 3 (Week 2): Pt will complete bed mobility with CGA PT Short Term Goal 4 (Week 2): Pt will complete transfers consistently CGA with LRAD  Skilled Therapeutic Interventions/Progress Updates:    Pt presents in room in [facility], motivated to participate with PT, does report pain but premedicated. Pt reports dizziness but not more than baseline. Session focused on gait training and therapeutic activities to promote energy conservation and activity tolerance.  Pt ambulates 29' with RW CGA/min assist no WC follow, good RW mgmt initially but demonstrates L path deviation with fatigue, verbal cues for correct and then min assist to prevent forward LOB. Pt ambulates 2nd trial 16' with RW CGA/very light min assist with pt demonstrating improved upright posture, ambulates more slowly with more standing rest breaks but improved proximity to RW and improved safety awareness even with fatigue. Pt with improved positioning prior to sitting with improved use of RW and reaching back with RUE prior to sitting.  Pt ambulates up/down 4 steps with BHRs, min assist step to gait pattern leading in RLE ascending, LLE descending, mod verbal cues and min assist for BUE advance on rails requires occasional min assist for LUE advance. Pt BLEs shaky throughout task but no buckling noted.  Pt participates with continuous training 5 min on nustep workload 4 for BUE/BLE strengthening and tolerance to activity as well as pain relief for R knee and back. Pt reports feeling better following activity  Pt returns to room  with husband in [facility] at end of session.     Therapy Documentation Precautions:  Precautions Precautions: Fall Restrictions Weight Bearing Restrictions: No  Therapy/Group: Individual Therapy  Edwin Cap 06/24/2023, 12:29 PM

## 2023-06-24 NOTE — Progress Notes (Signed)
PROGRESS NOTE   Subjective/Complaints: Patient reports she did not sleep well last night.  Denies any specific reason to her poor sleep.  She declines any scheduled medication to help with sleep but would be okay with as needed medication.  Dizziness a little improved today from yesterday.  She is happy she did not need to repeat neuroimaging yesterday.  LBM 10/14   ROS: Ongoing dizziness, lethargy.-Improved Pertinent positives per HPI above.  Patient denies fever, rash, sore throat, blurred vision,  nausea, vomiting, diarrhea, cough, shortness of breath or chest pain, joint or back/neck pain, headache, or mood change.   + insomnia   Objective:   No results found. Recent Labs    06/24/23 0607  WBC 8.9  HGB 8.3*  HCT 25.6*  PLT 224     Recent Labs    06/22/23 0702 06/24/23 0607  NA 142 140  K 4.6 4.2  CL 107 110  CO2 25 23  GLUCOSE 123* 106*  BUN 22 32*  CREATININE 1.37* 1.13*  CALCIUM 8.4* 8.3*     Intake/Output Summary (Last 24 hours) at 06/24/2023 1618 Last data filed at 06/24/2023 0900 Gross per 24 hour  Intake 240 ml  Output --  Net 240 ml     Pressure Injury 06/03/23 Buttocks Right Deep Tissue Pressure Injury - Purple or maroon localized area of discolored intact skin or blood-filled blister due to damage of underlying soft tissue from pressure and/or shear. purple (Active)  06/03/23 1740  Location: Buttocks  Location Orientation: Right  Staging: Deep Tissue Pressure Injury - Purple or maroon localized area of discolored intact skin or blood-filled blister due to damage of underlying soft tissue from pressure and/or shear.  Wound Description (Comments): purple  Present on Admission: Yes     Pressure Injury 06/03/23 Buttocks Left Deep Tissue Pressure Injury - Purple or maroon localized area of discolored intact skin or blood-filled blister due to damage of underlying soft tissue from pressure and/or  shear. (Active)  06/03/23   Location: Buttocks  Location Orientation: Left  Staging: Deep Tissue Pressure Injury - Purple or maroon localized area of discolored intact skin or blood-filled blister due to damage of underlying soft tissue from pressure and/or shear.  Wound Description (Comments):   Present on Admission: Yes    Physical Exam: Vital Signs Blood pressure 127/82, pulse 93, temperature 98.3 F (36.8 C), temperature source Oral, resp. rate 19, height 5\' 2"  (1.575 m), weight 73.8 kg, SpO2 93%.  Constitutional: No distress . Vital signs reviewed.  Eyes: Horizontal nystagmus with gaze inb all directions -also present today 10/14. EOMI. PERRLA Cardiovascular: RRR without murmur. No JVD  . Edema resolved.  Respiratory/Chest: CTA Bilaterally without wheezes or rales.  Comfortable on room air.    GI/Abdomen: BS +, non-tender, non-distended Ext: no clubbing, cyanosis, or edema Psych: pleasant and cooperative; mildly lethargic Skin: C/D/I. No apparent lesions.  Bilateral buttocks bruising as below-not examined today          Neurologic exam: alert and attentive Cognition: AAO to person, place, time  +Mild cognitive, memory deficits -mild delayed recall Insight: reasonable  insight into current condition.  Mood: Pleasant affect, appropriate mood.  Strength:  Moving all 4 extremities to gravity and resistance LUE ataxia -ongoing, worse 10/11-12; unchanged 10-13   Assessment/Plan: 1. Functional deficits which require 3+ hours per day of interdisciplinary therapy in a comprehensive inpatient rehab setting. Physiatrist is providing close team supervision and 24 hour management of active medical problems listed below. Physiatrist and rehab team continue to assess barriers to discharge/monitor patient progress toward functional and medical goals  Care Tool:  Bathing    Body parts bathed by patient: Right arm, Left arm, Chest, Abdomen, Right upper leg, Left upper leg, Face, Front  perineal area   Body parts bathed by helper: Left lower leg, Right lower leg, Buttocks, Front perineal area     Bathing assist Assist Level: Moderate Assistance - Patient 50 - 74%     Upper Body Dressing/Undressing Upper body dressing   What is the patient wearing?: Pull over shirt    Upper body assist Assist Level: Contact Guard/Touching assist    Lower Body Dressing/Undressing Lower body dressing      What is the patient wearing?: Underwear/pull up, Pants     Lower body assist Assist for lower body dressing: Moderate Assistance - Patient 50 - 74%     Toileting Toileting    Toileting assist Assist for toileting: Moderate Assistance - Patient 50 - 74%     Transfers Chair/bed transfer  Transfers assist  Chair/bed transfer activity did not occur: Safety/medical concerns  Chair/bed transfer assist level: Minimal Assistance - Patient > 75%     Locomotion Ambulation   Ambulation assist      Assist level: 2 helpers Assistive device: Walker-rolling Max distance: 50'   Walk 10 feet activity   Assist  Walk 10 feet activity did not occur: Safety/medical concerns  Assist level: Minimal Assistance - Patient > 75% Assistive device: Walker-rolling   Walk 50 feet activity   Assist Walk 50 feet with 2 turns activity did not occur: Safety/medical concerns  Assist level: 2 helpers Assistive device: Walker-rolling    Walk 150 feet activity   Assist Walk 150 feet activity did not occur: Safety/medical concerns         Walk 10 feet on uneven surface  activity   Assist Walk 10 feet on uneven surfaces activity did not occur: Safety/medical concerns         Wheelchair     Assist Is the patient using a wheelchair?: Yes Type of Wheelchair: Manual    Wheelchair assist level: Dependent - Patient 0%      Wheelchair 50 feet with 2 turns activity    Assist        Assist Level: Dependent - Patient 0%   Wheelchair 150 feet activity      Assist      Assist Level: Dependent - Patient 0%   Blood pressure 127/82, pulse 93, temperature 98.3 F (36.8 C), temperature source Oral, resp. rate 19, height 5\' 2"  (1.575 m), weight 73.8 kg, SpO2 93%.   Medical Problem List and Plan: 1. Functional deficits secondary to debility after cardiac arrest and multiple related complications             -patient may shower             -ELOS/Goals: 12-14 days, min assist with PT, OT (now CGA/SPV goals), SLP - discharge date 10/22             -pt has episodes of fatigue/somnolence at baseline per husband. He says she does better in the later morning and early afternoon.   -  Continue CIR therapies including PT, OT, and SLP     - 10/10: Reached out to palliative, requesting they touch base with patient and family again prior to discharge 10-22 regarding lingering questions on caregiver role and end-of-life  10/12: Worsening overall dizziness/vertigo and ataxia, progressive over last 2 days; patient opts to wait 1 more day for repeat imaging to evaluate -self reports symptoms improved 10-13  10/14 symptoms continue to improve, continue to hold off on repeat imaging.  2.  Antithrombotics: -DVT/anticoagulation:  Mechanical:  Antiembolism stockings, knee (TED hose) Bilateral lower extremities (age indeterminate left posterior tib DVT). 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             -antiplatelet therapy: none   3. Pain Management: Tylenol, Norco, oxycodone as needed -Lidoderm patch             -Oxycontin 10 mg q 12 hours             -observe for further sedation. Was somnolent this morning after receiving pain medication.   -10-3: Continue OxyContin 10 mg every 12 hours (is on 13.5 mg every 12 at home) just short acting to home regimen oxycodone 5 mg every 4 hours as needed     - 10/8: Add PRN methocarbamol for muscle soreness -   not used    10/5-6 pain appears controlled.  4. Mood/Behavior/Sleep: LCSW to evaluate and provide emotional support             -antipsychotic agents: Zyprexa 2.5 mg q HS-at home dose             -Xanax prn -at home dose  -10/14 as needed melatonin 3 mg   5. Neuropsych/cognition: This patient is capable of making decisions on his own behalf.   6. Skin/Wound Care: Routine skin care checks   -10-1: Family inquiring about protein supplementation, will look into supplements given on inpatient.   7. Fluids/Electrolytes/Nutrition: Routine Is and Os and follow-up chemistries   -10-1: See below, potassium borderline, magnesium repleted today.  Rechecking in AM.   - 10-2: Electrolytes repleted, labs normal, monitor  - 10/4: Encourage p.o. fluids due to tachycardia today; BMP in a.m.  -10/6 Potassium 3.2--increase supplement  -BUN/Cr holding fairly steady--continue to push fluids -10-7: Potassium 3.8.  Continue current supplement.  BUN/creatinine stable.  - labs in AM for BMP, Mag given soreness -stable 1/9; AKI 10-12; repeat Monday -10/14 Potassium stable at 4.2    8: Hypertension: monitor TID and prn (home Norvasc held)   - Remains normotensive, monitor with addition of spironolactone/ BMP  - change to 10/11 given symptoms   - 10/12: BMP with AKI; increase in BP may be from steroids vs. 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  Will monitor for any functional decline, and may reach out to neuro-oncology for prolonged taper depending on symptom severity.  10/10: Finished Decadron  taper, discussed with family notifying provider if any worsening symptoms of dizziness, ataxia, or lethargy with this.  If needed to resume, will reach out to Dr. Barbaraann Cao   10: Bilateral rib fractures due to chest compressions -pain control as above   11: V-fib cardiac arrest:  -continue Pacerone 400 mg daily for one week, then 200 mg daily on 10/08             -not a candidate for invasive intervention             -K > 4; Mg > 2 -potassium 3.8 on admission labs, recheck in AM.  Magnesium 1.6, replete 2 g IV today and then start 400 mg twice daily due to recurrent drops.  Recheck in AM.             -if patient needs diuretics outpatient due to lower extremity edema then cardiology will consider changing to a potassium sparing diuretic such as spironolactone 25 mg daily along with some potassium supplementation and magnesium             -follow-up with Dr. Lynnette Caffey 3 weeks after D/C from CIR    - Daily weights - reodered 10/11  -10/6 supplement K+  -10-9: Reminding nursing to perform daily weights.will have cards eval today to initiate diuretic and adjust meds given ongoing swelling and mild hypotension  10/10: Cardiology resumed spironolactone 25 mg daily due to peripheral edema, appears to be helping.     10/12: reduce to 12.5 mg daily d/t AKI  12: Stage IV lung cancer/mets to brain:             -continue Keppra 1000 mg BID             -follow-up with Dr. 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 fluids, labs stable, f/u monday  10-7: Creatinine stabilizing at 1.1-1.2; unchange d10-9;  - 10/11-10/12 0.96-> 1.37 Cr with improved BUN; reduce spirinolactone to 12.5 mg daily.  -10/14 creatinine down to 1.13 although BUN increased to 32, continue to encourage oral fluid intake.    17: Anemia: ~3 gram drop in hemoglobin/no overt bleeding             -follow-up CBC. Most recently has been ~7.7; stable on admission labs             -check Hemoccult X 3 -pending             -has hemorrhoids, constipated             -continue PPI, Pepcid   - hgb stable at 7.8 on 10/5; greater than 8 10/7  -10/14 Hgb stable at 8.3   18: DM:  A1c = 7.3%, also on Decadron             -start CBGs QID and SSI             -carb modified diet as tolerated  Blood sugars well-controlled, monitor  -10-3: Mild elevation blood sugars over the last few days, likely with improved diet; not on any diabetic regimen as outpatient.  Monitor 1 day and consider initiating low-dose metformin if no improvement.   - 10-4: Patient working on dietary adjustments, mildly improved.  Continue to monitor Recent Labs    06/23/23 2116 06/24/23 0553 06/24/23 1223  GLUCAP 112* 102* 98    10/6 sugars have been elevated due to steroids--decadron down to 1mg  daily and readings improved except for readings around dinner time   -continue current cbg checks and SSI for now   -may be able to stop these soon 10-11: Stable, occassional 140s but overall <120. Can consider metformin if renal function stable on labs Monday  10/14 CBGs well-controlled continue current regimen    19: Prior post fossa craniotomy for tumor resection 12/02/2020: resection of progressive cerebellar mass by Dr. Venetia Maxon. 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 Has chronic intermittent dizziness>>meclizine prn   - well controlled, no current complaints  -10/5 scheduled meclizine 25mg  tid  per pt request -> increase to 50 mg TID 10/11; this was OP dose  - Had prior rxn to scopalamine  - 10/11: Symptoms worsening s/p steroid taper, messaged Dr. Barbaraann Cao regarding resuming 1 mg daily until OP follow up --> he is in agreement with this, resumed 10/11   - 10/12: Symptoms worsening overall; see above, if continuing tomorrow will re-image    - 10/13: Improved, defer further imaging to neuro-oncology/oncology OP   20: External hemorrhoids: continue topical hydrocortisone 2.5%; reordered today, as suppository was prior ordered.   21: Intermittent constipation: check for impaction then decide on appropriate regimen  10-1: Multiple small bowel movements overnight, add MiraLAX 17 mg daily  10-2: Small brown bowel movement this a.m., mushy consistency - improved  Last bowel movement 10/4--hopeful for BM today 10/6  Small bowel movement 10-7, increase MiraLAX to twice daily and add as needed Senokot per family request  10-14 last BM today improved      LOS: 14 days A FACE TO FACE EVALUATION WAS PERFORMED  [name] Dance 06/24/2023, 4:18 PM

## 2023-06-25 LAB — GLUCOSE, CAPILLARY
Glucose-Capillary: 109 mg/dL — ABNORMAL HIGH (ref 70–99)
Glucose-Capillary: 118 mg/dL — ABNORMAL HIGH (ref 70–99)
Glucose-Capillary: 124 mg/dL — ABNORMAL HIGH (ref 70–99)
Glucose-Capillary: 152 mg/dL — ABNORMAL HIGH (ref 70–99)

## 2023-06-25 MED ORDER — AMIODARONE HCL 200 MG PO TABS
200.0000 mg | ORAL_TABLET | Freq: Every day | ORAL | Status: DC
  Administered 2023-06-25 – 2023-06-26 (×2): 200 mg via ORAL
  Filled 2023-06-25 (×2): qty 1

## 2023-06-25 NOTE — Progress Notes (Signed)
PROGRESS NOTE   Subjective/Complaints: No acute complaijnts. No events overnigt.  Patient feeling well today; walked 80+feet with PT! Dizziness remains but is much better controlled. Does feel edema in legs is up somewhat; explained need to balance diuretics and likely increased swelling with steroids. All questions asnwered. Patient's husband grateful for her care here!   LBM 10/14   ROS: Ongoing dizziness, lethargy.-Improved Pertinent positives per HPI above.  Patient denies fever, rash, sore throat, blurred vision,  nausea, vomiting, diarrhea, cough, shortness of breath or chest pain, joint or back/neck pain, headache, or mood change.   + insomnia   Objective:   No results found. Recent Labs    06/24/23 0607  WBC 8.9  HGB 8.3*  HCT 25.6*  PLT 224     Recent Labs    06/24/23 0607  NA 140  K 4.2  CL 110  CO2 23  GLUCOSE 106*  BUN 32*  CREATININE 1.13*  CALCIUM 8.3*     Intake/Output Summary (Last 24 hours) at 06/25/2023 0950 Last data filed at 06/25/2023 0900 Gross per 24 hour  Intake 720 ml  Output --  Net 720 ml     Pressure Injury 06/03/23 Buttocks Right Deep Tissue Pressure Injury - Purple or maroon localized area of discolored intact skin or blood-filled blister due to damage of underlying soft tissue from pressure and/or shear. purple (Active)  06/03/23 1740  Location: Buttocks  Location Orientation: Right  Staging: Deep Tissue Pressure Injury - Purple or maroon localized area of discolored intact skin or blood-filled blister due to damage of underlying soft tissue from pressure and/or shear.  Wound Description (Comments): purple  Present on Admission: Yes     Pressure Injury 06/03/23 Buttocks Left Deep Tissue Pressure Injury - Purple or maroon localized area of discolored intact skin or blood-filled blister due to damage of underlying soft tissue from pressure and/or shear. (Active)  06/03/23    Location: Buttocks  Location Orientation: Left  Staging: Deep Tissue Pressure Injury - Purple or maroon localized area of discolored intact skin or blood-filled blister due to damage of underlying soft tissue from pressure and/or shear.  Wound Description (Comments):   Present on Admission: Yes    Physical Exam: Vital Signs Blood pressure (!) 149/89, pulse 87, temperature 97.7 F (36.5 C), temperature source Oral, resp. rate 17, height 5\' 2"  (1.575 m), weight 73.8 kg, SpO2 100%.  Constitutional: No distress . Vital signs reviewed. Sitting in gym.  Eyes: Horizontal nystagmus with gaze inb all directions -much improved 10/15 but remains persistent. EOMI. PERRLA Cardiovascular: RRR without murmur. No JVD  . Edema resolved.  Respiratory/Chest: CTA Bilaterally without wheezes or rales.  Comfortable on room air.    GI/Abdomen: BS +, non-tender, non-distended Ext: no clubbing, cyanosis, or edema Psych: pleasant and cooperative; mildly lethargic Skin: C/D/I. No apparent lesions.  Bilateral buttocks DTI as below-not examined today          Neurologic exam: alert and attentive Cognition: AAO to person, place, time and event.  +Mild cognitive, memory deficits -mild delayed recall - stable + Mild dysarthria Insight: reasonable  insight into current condition.  Mood: Pleasant affect, appropriate mood.  Strength: Moving  all 4 extremities to gravity and resistance, 5-/5 LUE ataxia -ongoing, improved 10/15   Assessment/Plan: 1. Functional deficits which require 3+ hours per day of interdisciplinary therapy in a comprehensive inpatient rehab setting. Physiatrist is providing close team supervision and 24 hour management of active medical problems listed below. Physiatrist and rehab team continue to assess barriers to discharge/monitor patient progress toward functional and medical goals  Care Tool:  Bathing    Body parts bathed by patient: Right arm, Left arm, Chest, Abdomen, Right upper  leg, Left upper leg, Face, Front perineal area   Body parts bathed by helper: Left lower leg, Right lower leg, Buttocks, Front perineal area     Bathing assist Assist Level: Moderate Assistance - Patient 50 - 74%     Upper Body Dressing/Undressing Upper body dressing   What is the patient wearing?: Pull over shirt    Upper body assist Assist Level: Contact Guard/Touching assist    Lower Body Dressing/Undressing Lower body dressing      What is the patient wearing?: Underwear/pull up, Pants     Lower body assist Assist for lower body dressing: Moderate Assistance - Patient 50 - 74%     Toileting Toileting    Toileting assist Assist for toileting: Moderate Assistance - Patient 50 - 74%     Transfers Chair/bed transfer  Transfers assist  Chair/bed transfer activity did not occur: Safety/medical concerns  Chair/bed transfer assist level: Minimal Assistance - Patient > 75%     Locomotion Ambulation   Ambulation assist      Assist level: 2 helpers Assistive device: Walker-rolling Max distance: 50'   Walk 10 feet activity   Assist  Walk 10 feet activity did not occur: Safety/medical concerns  Assist level: Minimal Assistance - Patient > 75% Assistive device: Walker-rolling   Walk 50 feet activity   Assist Walk 50 feet with 2 turns activity did not occur: Safety/medical concerns  Assist level: 2 helpers Assistive device: Walker-rolling    Walk 150 feet activity   Assist Walk 150 feet activity did not occur: Safety/medical concerns         Walk 10 feet on uneven surface  activity   Assist Walk 10 feet on uneven surfaces activity did not occur: Safety/medical concerns         Wheelchair     Assist Is the patient using a wheelchair?: Yes Type of Wheelchair: Manual    Wheelchair assist level: Dependent - Patient 0%      Wheelchair 50 feet with 2 turns activity    Assist        Assist Level: Dependent - Patient 0%    Wheelchair 150 feet activity     Assist      Assist Level: Dependent - Patient 0%   Blood pressure (!) 149/89, pulse 87, temperature 97.7 F (36.5 C), temperature source Oral, resp. rate 17, height 5\' 2"  (1.575 m), weight 73.8 kg, SpO2 100%.   Medical Problem List and Plan: 1. Functional deficits secondary to debility after cardiac arrest and multiple related complications             -patient may shower             -ELOS/Goals: 12-14 days, min assist with PT, OT (now CGA/SPV goals), SLP - discharge date 10/22             -pt has episodes of fatigue/somnolence at baseline per husband. He says she does better in the later morning and early afternoon.   -  Continue CIR therapies including PT, OT, and SLP    - 10/15: walking up to 68 feet; working on cognitive engagement; 13 step goal per family not realistic, has 1st floor setup. D/w family purchasing Roho to prevent skin breakdown.    - 10/10: Reached out to palliative, requesting they touch base with patient and family again prior to discharge 10-22 regarding lingering questions on caregiver role and end-of-life  10/12: Worsening overall dizziness/vertigo and ataxia, progressive over last 2 days; patient opts to wait 1 more day for repeat imaging to evaluate -self reports symptoms improved 10-13; ongoing   2.  Antithrombotics: -DVT/anticoagulation:  Mechanical:  Antiembolism stockings, knee (TED hose) Bilateral lower extremities (age indeterminate left posterior tib DVT). 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             -antiplatelet therapy: none   3. Pain Management: Tylenol, Norco, oxycodone as needed -Lidoderm patch             -Oxycontin 10 mg q 12 hours             -observe for further sedation. Was somnolent this morning after receiving pain medication.   -10-3: Continue OxyContin 10 mg every 12 hours (is on  13.5 mg every 12 at home) just short acting to home regimen oxycodone 5 mg every 4 hours as needed     - 10/8: Add PRN methocarbamol for muscle soreness -  not used    10/5-6 pain appears controlled.  4. Mood/Behavior/Sleep: LCSW to evaluate and provide emotional support             -antipsychotic agents: Zyprexa 2.5 mg q HS-at home dose             -Xanax prn -at home dose  -10/14 as needed melatonin 3 mg   5. Neuropsych/cognition: This patient is capable of making decisions on his own behalf.   6. Skin/Wound Care: Routine skin care checks   -10-1: Family inquiring about protein supplementation, will look into supplements given on inpatient.   7. Fluids/Electrolytes/Nutrition: Routine Is and Os and follow-up chemistries   -10-1: See below, potassium borderline, magnesium repleted today.  Rechecking in AM.   - 10-2: Electrolytes repleted, labs normal, monitor  - 10/4: Encourage p.o. fluids due to tachycardia today; BMP in a.m.  -10/6 Potassium 3.2--increase supplement  -BUN/Cr holding fairly steady--continue to push fluids -10-7: Potassium 3.8.  Continue current supplement.  BUN/creatinine stable.  - labs in AM for BMP, Mag given soreness -stable 1/9; AKI 10-12; repeat Monday -10/14 Potassium stable at 4.2    8: Hypertension: monitor TID and prn (home Norvasc held)   - Remains normotensive, monitor with addition of spironolactone/ BMP  - change to 10/11 given symptoms   - 10/12: BMP with AKI; increase in BP may be from steroids vs. 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  Will monitor for any functional decline, and may reach out to neuro-oncology for prolonged taper depending on symptom severity.  10/10: Finished Decadron taper, discussed with family notifying provider if any worsening symptoms of dizziness, ataxia, or lethargy with this.  If needed to resume, will reach out to Dr. Barbaraann Cao --> resumed 1 mg daily until OP f/u   10: Bilateral rib fractures due to chest compressions -pain control as above   11: V-fib cardiac arrest:  -continue Pacerone 400 mg daily for one week, then 200 mg daily on 10/08--Dropped off 10/15 ? transition, resumed 10/15, will touch base with cardiology regarding plans for transition to BB              -not a candidate for invasive intervention             -K > 4; Mg > 2 -potassium 3.8 on admission labs, recheck in AM.  Magnesium 1.6, replete 2 g IV today and then start 400 mg twice daily due to recurrent drops.  Recheck in AM.             -if patient needs diuretics outpatient due to lower extremity edema then cardiology will consider changing to a potassium sparing diuretic such as spironolactone 25 mg daily along with some potassium supplementation and magnesium             -follow-up with Dr. Lynnette Caffey 3 weeks after D/C from CIR    - Daily weights - reodered 10/11  -10/6 supplement K+  -10-9: Reminding nursing to perform daily weights.will have cards eval today to initiate diuretic and adjust meds given ongoing swelling and mild hypotension  10/10: Cardiology resumed spironolactone 25 mg daily due to peripheral edema, appears to be helping.     10/12: reduce to 12.5 mg daily d/t AKI  10/15: D/w nursing re: resuming daily weights. May need to increase diuretics again d/t swelling; monitorring closely.   Filed Weights   06/10/23 1735 06/15/23 0316 06/16/23 0709  Weight: 68.7 kg 69.8 kg 73.8 kg     12: Stage IV lung cancer/mets to brain:              -continue Keppra 1000 mg BID             -follow-up with Dr. 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 fluids, labs stable, f/u monday  10-7: Creatinine stabilizing at 1.1-1.2; unchange d10-9;  - 10/11-10/12 0.96-> 1.37 Cr with improved BUN; reduce spirinolactone to 12.5 mg daily.  -10/14 creatinine down to 1.13 although BUN increased to 32, continue to encourage oral fluid intake.   - recheck 10/16  17: Anemia: ~3 gram drop in hemoglobin/no overt bleeding             -follow-up CBC. Most recently has been ~7.7; stable on admission labs             -check Hemoccult X 3 -pending             -has hemorrhoids, constipated             -continue PPI, Pepcid   - hgb stable at 7.8 on 10/5; greater than 8 10/7  -10/14 Hgb stable at 8.3   18: DM: A1c = 7.3%, also on Decadron             -start CBGs QID and SSI             -carb modified diet as tolerated  Blood sugars well-controlled, monitor  -10-3: Mild elevation blood sugars over the last few days, likely with improved diet; not on any diabetic regimen as outpatient.  Monitor 1 day and consider initiating low-dose metformin if no improvement.   - 10-4: Patient working on dietary adjustments, mildly improved.  Continue to monitor Recent Labs    06/24/23 1657 06/24/23 2206 06/25/23 0604  GLUCAP 103* 136* 109*    10/6 sugars have been elevated due to  steroids--decadron down to 1mg  daily and readings improved except for readings around dinner time   -continue current cbg checks and SSI for now   -may be able to stop these soon  10/14-15 CBGs well-controlled continue current regimen; elevated d/t steroids    19: Prior post fossa craniotomy for tumor resection 12/02/2020: resection of progressive cerebellar mass by Dr. Venetia Maxon. 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 Has chronic intermittent dizziness>>meclizine prn   - well controlled, no current complaints  -10/5 scheduled meclizine 25mg  tid per pt request -> increase to 50 mg TID 10/11; this was OP dose  - Had prior rxn to scopalamine  - 10/11: Symptoms worsening s/p steroid taper, messaged Dr. Barbaraann Cao regarding resuming 1 mg daily until OP follow up --> he is in agreement with this, resumed 10/11   - 10/12: Symptoms worsening overall; see above, if continuing tomorrow will re-image    - 10/13: Improved, defer further imaging to neuro-oncology/oncology OP   20: External hemorrhoids: continue topical hydrocortisone 2.5%; reordered today, as suppository was prior ordered.   21: Intermittent constipation: check for impaction then decide on appropriate regimen  10-1: Multiple small bowel movements overnight, add MiraLAX 17 mg daily  10-2: Small brown bowel movement this a.m., mushy consistency - improved  Last bowel movement 10/4--hopeful for BM today 10/6  Small bowel movement 10-7, increase MiraLAX to twice daily and add as needed Senokot per family request  10-14 last BM today improved      LOS: 15 days A FACE TO FACE EVALUATION WAS PERFORMED  Angelina Sheriff 06/25/2023, 9:50 AM

## 2023-06-25 NOTE — Plan of Care (Signed)
  Problem: RH Expression Communication Goal: LTG Patient will increase speech intelligibility (SLP) Description: LTG: Patient will increase speech intelligibility at word/phrase/conversation level with cues, % of the time (SLP) Flowsheets (Taken 06/25/2023 0647) LTG: Patient will increase speech intelligibility (SLP): Supervision Level: (Sentence) -- Percent of time patient will use intelligible speech: 100% Note: Goal downgraded due to inconsistent progress

## 2023-06-25 NOTE — Progress Notes (Signed)
Patient ID: Virginia [name], female   DOB: 08-07-1956, 67 y.o.   MRN: [id number]  SW met with pt and pt husband in room to provide updates from team conference, d/c date remains 10/122, informed on gains made in rehab, and pt established with [facility]. SW will continue to provide updates as available.   Cecile Sheerer, MSW, LCSWA Office: [phone] Cell: [phone] Fax: [phone]

## 2023-06-25 NOTE — Progress Notes (Signed)
Speech Language Pathology Weekly Progress Note  Patient Details  Name: Virginia [name] MRN: [id number] Date of Birth: [birth year]  Beginning of progress report period: June 11, 2023 End of progress report period: June 25, 2023   Short Term Goals: Week 2: SLP Short Term Goal 1 (Week 2): Patient will solve basic environmental problems in 4/5 opportunities given minA. SLP Short Term Goal 1 - Progress (Week 2): Met SLP Short Term Goal 2 (Week 2): Patient will attend to the left during functional vision-based tasks (i.e. reading, drawing, etc.) given minA. SLP Short Term Goal 2 - Progress (Week 2): Met SLP Short Term Goal 3 (Week 2): Patient will utilize external memory and orientation aids to recall time, day of the week, and basic daily information given minA. SLP Short Term Goal 3 - Progress (Week 2): Met SLP Short Term Goal 4 (Week 2): Patient will demonstrate emergent awareness of problems during basic functional tasks in 4/5 opportunities given modA. SLP Short Term Goal 4 - Progress (Week 2): Met SLP Short Term Goal 5 (Week 2): Patient will utilize speech intelligibility strategies at the sentence level with Mod I to achieve~90% intelligibility. SLP Short Term Goal 5 - Progress (Week 2): Not met    New Short Term Goals: Week 3: SLP Short Term Goal 1 (Week 3): STGs=LTGs due to ELOS  Weekly Progress Updates: Patient continues to make functional gains and has met 4 of 5 STGs this reporting period. Currently, patient requires overall supervision-Min A verbal cues for use of speech intelligibility strategies at the sentence level to achieve ~90% intelligibility. Overall Min-Mod verbal and visual cues are also needed for patient to complete functional and familiar tasks safely in regards to problem solving, emergent awareness, left visual scanning/attention and recall of functional information with use of strategies. Patient and family education ongoing. Patient would benefit from  continued skilled SLP intervention to maximize her speech intelligibility and cognitive functioning in order to reduce caregiver burden.      Intensity: Minumum of 1-2 x/day, 30 to 90 minutes Frequency: 1 to 3 out of 7 days Duration/Length of Stay: 07/02/23 Treatment/Interventions: Cognitive remediation/compensation;Environmental controls;Cueing hierarchy;Functional tasks;Therapeutic Activities;Internal/external aids;Patient/family education;Medication managment     Lakesa Coste 06/25/2023, 6:44 AM

## 2023-06-25 NOTE — Progress Notes (Signed)
Occupational Therapy Weekly Progress Note  Patient Details  Name: Virginia [name] MRN: [id number] Date of Birth: [birth year]  Beginning of progress report period: June 11, 2023 End of progress report period: June 25, 2023  Today's Date: 06/25/2023 OT Individual Time: 4098-1191 OT Individual Time Calculation (min): 29 min    Patient has met 3 of 3 short term goals.  Patient is making steady progress towards OT goals. She is at an overall min A level for most BADL tsaks but still needs occasional mod A for LB ADLs. Endurance within functional tasks has also improved. Continue current POC.   Patient continues to demonstrate the following deficits: muscle weakness, decreased cardiorespiratoy endurance, decreased awareness and decreased problem solving, and decreased sitting balance, decreased standing balance, decreased postural control, hemiplegia, and decreased balance strategies and therefore will continue to benefit from skilled OT intervention to enhance overall performance with BADL and Reduce care partner burden.  Patient progressing toward long term goals..  Continue plan of care.  OT Short Term Goals Week 3:  OT Short Term Goal 1 (Week 3): LTG=STG 2/2 ELOS  Skilled Therapeutic Interventions/Progress Updates:    Pt greeted seated in recliner and agreeable to OT treatment session. Pt brought to therapy gym and ambulated 10 feet to NuStep. Pt completed 15 minutes on NuStep on level 5 without rest break for UB/LB strength/endurance. Pt pivoted back to wc with RW and min A. Pt left in care of husband to return to room.   Therapy Documentation Precautions:  Precautions Precautions: Fall Restrictions Weight Bearing Restrictions: No Pain:  Pt reports pain is manageable at this time   Therapy/Group: Individual Therapy  Mal Amabile 06/25/2023, 10:38 AM

## 2023-06-25 NOTE — Patient Care Conference (Signed)
Inpatient RehabilitationTeam Conference and Plan of Care Update Date: 06/25/2023   Time: 10:34 AM    Patient Name: Virginia [name]      Medical Record Number: [id number]  Date of Birth: 07/05/1956 Sex: Female         Room/Bed: 4W23C/4W23C-01 Payor Info: Payor: MEDICARE / Plan: MEDICARE PART A AND B / Product Type: *No Product type* /    Admit Date/Time:  06/10/2023  5:08 PM  Primary Diagnosis:  Cardiac arrest [facility])  Hospital Problems: Principal Problem:   Cardiac arrest [facility]) Active Problems:   Lung cancer metastatic to brain (HCC)   Debility   History of DVT (deep vein thrombosis)   Urine retention   Acute blood loss anemia   History of ventricular fibrillation    Expected Discharge Date: Expected Discharge Date: 07/02/23  Team Members Present: Physician leading conference: Dr. Elijah Birk Social Worker Present: Cecile Sheerer, LCSWA Nurse Present: Vedia Pereyra, RN PT Present: Darrold Span, PT OT Present: Kearney Hard, OT SLP Present: Feliberto Gottron, SLP PPS Coordinator present : Fae Pippin, SLP     Current Status/Progress Goal Weekly Team Focus  Bowel/Bladder   Continent of b/b   Remain continent   Assist with toileting qshift and prn    Swallow/Nutrition/ Hydration               ADL's   UB bathing: SBA, LB bathing/UB dressing: Min A, Toileting/LB dressing: Total A. stand pvt t/f with RW: Min A. Decreased motor control, coordination, and strength of LUE.   CGA/supervision   family training with husband for transfers, self care. Increase activity tolerance and endurance to allow pt to complete LB dressing with less assistance. Increase standing balance and tolerance.    Mobility   minA overall, gait up to 70' with RW CGA/minA no WC follow, 4 stairs with BHRs minA   CGA short distance ambulation  barriers: dizziness, fatigue, pain; continue focus on gait training, endurance, stair negotiation, dynamic standing balance    Communication    Supervision-Min A   Supervision   use of intelligibility strategies at the sentence level in a mildly noisy/busy enviornment    Safety/Cognition/ Behavioral Observations  Min-Mod A   Min A   basic problem solving, emergent awareness, functional recall with use of strategies    Pain   pt c/o mid back pain 7-10 on pain scale. Scheduled and prn's given   <3 pain score   Assess qshift and prn    Skin   Skin intact, DTI on buttocks   Maintain skin integrity  Assess qshit and prn      Discharge Planning:  D/c plan remains for pt to discharge to home with husband who was assisting prior to admission. Has been staying here with her since admission. Pt is active with [facility]. SW will confirm there are no barriers to discharge.   Team Discussion: Cardiac arrest. Continent of bowel/bladder. Chronic back pain managed with OxyContin and oxycodone. DTI to bilateral buttocks. 3 step skin care. Husband at bedside. Dizziness and fatigue continue. Tolerating carb-mod diet. Daily weight. Checking blood sugars before meals only. Able to use phone functionally.    Patient on target to meet rehab goals: yes, progressing towards goals with discharge date of 07/02/23  *See Care Plan and progress notes for long and short-term goals.   Revisions to Treatment Plan:  Steroids re-started.  Blood pressure medications adjusted. Husband cleared for transfers and toileting. Monitor labs and VS Teaching Needs: Medications, safety, self  care, gait/transfer training, skin care, etc.   Current Barriers to Discharge: Wound care  Possible Resolutions to Barriers: Family education Order recommended DME     Medical Summary Current Status: medically complicated by dizziness/vertigo, nausea, fatigue, poor pain control, constipation, volume overload, AKI and cognitive deficits  Barriers to Discharge: Cardiac Complications;Inadequate Nutritional Intake;Medical stability;Morbid Obesity;Self-care  education;Uncontrolled Pain;Uncontrolled Diabetes;Volume Overload;Renal Insufficiency/Failure  Barriers to Discharge Comments: fatigue, cognitive deficits, dizziness Possible Resolutions to [employer]: titrate pain medications to minimum tolerated doses for function, titrate bowel medications, monitor vitals and increase BP regimen as approrpiate, monitor labs, titrate steroids for symptopmatic control of fatigue/dizziness   Continued Need for Acute Rehabilitation Level of Care: The patient requires daily medical management by a physician with specialized training in physical medicine and rehabilitation for the following reasons: Direction of a multidisciplinary physical rehabilitation program to maximize functional independence : Yes Medical management of patient stability for increased activity during participation in an intensive rehabilitation regime.: Yes Analysis of laboratory values and/or radiology reports with any subsequent need for medication adjustment and/or medical intervention. : Yes   I attest that I was present, lead the team conference, and concur with the assessment and plan of the team.   Jearld Adjutant 06/25/2023, 1:46 PM

## 2023-06-25 NOTE — Progress Notes (Signed)
Speech Language Pathology Daily Session Note  Patient Details  Name: Virginia [name] MRN: [id number] Date of Birth: September 09, 1956  Today's Date: 06/25/2023 SLP Individual Time: 1000-1045 SLP Individual Time Calculation (min): 45 min  Short Term Goals: Week 3: SLP Short Term Goal 1 (Week 3): STGs=LTGs due to ELOS  Skilled Therapeutic Interventions:  Patient was seen in am to address cognitive re- training. Pt was alert and seated upright in WC upon SLP arrival. Her husband was present and participated intermittently throughout session. Pt able to verbalize some of recent medical hx and PLOF. She frequently deferred questions to her husband who provided thorough medical hx. Pt reports her cognition and speech are worse since admission but she was able to refer to external aid for speech intelligibility strategies. SLP engaged pt in brainstorming task/ hobbies she could continue to do with independence. Pt able to verbalize  Per previous SLP notes and pt report, she has been working on navigating her phone with independence. Pt warranted mod A initially to place a phone call to her husband with cues able to faded to min A in additional trial. Pt also reports preference for listening to music on her phone. Pt navigated to preferred music app with min A and identified specific channels with mod A. SLP also modified pt's home screen for easier navigation to frequently used apps. Pt was left seated upright in WC with call button within reach and husband present. SLP to continue POC.   Pain Pain Assessment Pain Scale: 0-10 Pain Score: 7  Pain Type: Chronic pain Pain Location: Mouth Pain Descriptors / Indicators: Aching  Therapy/Group: Individual Therapy  [name] 06/25/2023, 12:45 PM

## 2023-06-25 NOTE — Progress Notes (Signed)
Occupational Therapy Session Note  Patient Details  Name: Virginia [name] MRN: [id number] Date of Birth: 18-Oct-1955  Today's Date: 06/25/2023 OT Individual Time: 8657-8469 OT Individual Time Calculation (min): 70 min   Short Term Goals: Week 2:  OT Short Term Goal 1 (Week 2): Pt will tolerate standing for 4 minutes within BADL task for functional endurance OT Short Term Goal 2 (Week 2): Patient will perform 1 step of toileting task OT Short Term Goal 3 (Week 2): Patient will don shirt with set-up A  Skilled Therapeutic Interventions/Progress Updates:    Pt greeted semi-reclined in bed with spouse present and agreeable to OT treatment session. PT declined need to participate in BADL tasks. Pt stood w/ RW and CGA. She ambulated 47 feet w/ RW and min/CGA with cues for RW positioning as pt likes to push RW out in front of her. Pt brought to therapy gym where we worked on B hand coordination with card shuffling and dealing. Addressed standing balance endurance, along with problem solving, and L hand coordination with standing UNO game. PT needed min cues to correctly place cards. She stood for intervals as stated below.  3 mins 30 seconds 2 minutes 3 minutes L leg would get shaky and require stabilization from OT on L leg to ramain standing 2 minutes  Standing UB there-ex using 2 lb weighted bar. 3 sets of 10 chest press, bicep curl in each standing bout with min to mod A for balance without UE support. Pt returned to room and left seated in wc with needs met.   Therapy Documentation Precautions:  Precautions Precautions: Fall Restrictions Weight Bearing Restrictions: No Pain:  Back pain, no number given, rest and repositioned   Therapy/Group: Individual Therapy  Mal Amabile 06/25/2023, 8:21 AM

## 2023-06-25 NOTE — Progress Notes (Signed)
Physical Therapy Weekly Progress Note  Patient Details  Name: Virginia [name] MRN: [id number] Date of Birth: 11-02-1955  Beginning of progress report period: June 18, 2023 End of progress report period: June 25, 2023  Today's Date: 06/25/2023 PT Individual Time: 1102-1157 PT Individual Time Calculation (min): 55 min   Patient has met 3 of 4 short term goals.  Pt making steady progress towards functional goals. Pt currently min assist/CGA overall, consistently completing stand pivot transfers with CGA and RW. Pt requires min assist for bed mobility due to dizziness. Pt is currently ambulating up to 62' with RW CGA/min assist with fatigue, no WC follow, and ambulating up/down 4 steps with BHRs min assist. Pt husband present for most sessions and has been receiving training throughout. Pt husband reports pt will be receiving two handrails for entrance into home, 5 steps to enter reports ~ height of steps to be 8-9".  Patient continues to demonstrate the following deficits muscle weakness, decreased cardiorespiratoy endurance, impaired timing and sequencing, unbalanced muscle activation, and decreased coordination, decreased visual perceptual skills, decreased attention to left, decreased memory, central origin, and decreased standing balance, decreased postural control, hemiplegia, and decreased balance strategies and therefore will continue to benefit from skilled PT intervention to increase functional independence with mobility.  Patient progressing toward long term goals..  Continue plan of care.  PT Short Term Goals Week 2:  PT Short Term Goal 1 (Week 2): Pt will ambulate with RW min assist 50' PT Short Term Goal 1 - Progress (Week 2): Met PT Short Term Goal 2 (Week 2): Pt will complete up/down 4 steps with BHRs min assist PT Short Term Goal 2 - Progress (Week 2): Met PT Short Term Goal 3 (Week 2): Pt will complete bed mobility with CGA PT Short Term Goal 3 - Progress (Week 2):  Progressing toward goal PT Short Term Goal 4 (Week 2): Pt will complete transfers consistently CGA with LRAD PT Short Term Goal 4 - Progress (Week 2): Met Week 3:  PT Short Term Goal 1 (Week 3): STG = LTG due to ELOS  Skilled Therapeutic Interventions/Progress Updates:    Pt presents in room in [facility], motivated to participate with therapy in high spirits this session. Pt reports slight pain in back at this time however premedicated, states she did not need to take medication for anxiety today and dizziness is slightly better than normal. Session focused on gait training for tolerance to upright and stair negotiation and bed mobility training.  Pt transported from room to day room via [facility] dependently for time management. Pt ambulates with RW and increased time CGA/min assist 85', demonstrates good stability and RW mgmt for initial 67' but begins to demonstrate increased path deviation to the L requiring min assist. Pt then ambulates to/from mat ~20' with RW CGA cues for attention to L and positioning prior to sitting. Pt completes bed mobility training, sit to supine with min assist for repositioning in supine with pt reporting increased dizziness with position changes. Pt then completes rolling bilaterally min assist with cues for hooklying position and reaching across body with opposite UE. Pt then completes supine to sit with min assist verbal cues for sequencing and technique. Pt requires increased time in each position to allow dizziness to decrease.  Pt then ambulates from mat back to WC ~20' andt transported in [facility] to main gym. Pt completes alternating step ups x20 with BHRs and 3" steps as warm up prior to stair negotiation. Pt completes up/down  4 6" steps with BHRs min assist, improved stability for step, ascending leading with RLE, descending leading with RLE due to pt demonstrating improved technique and sequencing, no  knee buckle noted however pt does demonstrate some resting tremor in LLE.  Pt  returns to sitting in [place] and returns to room where she remains seated in [facility] with all needs within reach and husband at bedisde at end of session.  Therapy Documentation Precautions:  Precautions Precautions: Fall Restrictions Weight Bearing Restrictions: No  Therapy/Group: Individual Therapy  Edwin Cap 06/25/2023, 4:02 PM

## 2023-06-26 LAB — GLUCOSE, CAPILLARY
Glucose-Capillary: 94 mg/dL (ref 70–99)
Glucose-Capillary: 161 mg/dL — ABNORMAL HIGH (ref 70–99)
Glucose-Capillary: 86 mg/dL (ref 70–99)
Glucose-Capillary: 129 mg/dL — ABNORMAL HIGH (ref 70–99)

## 2023-06-26 LAB — BASIC METABOLIC PANEL
Anion gap: 7 (ref 5–15)
BUN: 34 mg/dL — ABNORMAL HIGH (ref 8–23)
CO2: 24 mmol/L (ref 22–32)
Calcium: 8.1 mg/dL — ABNORMAL LOW (ref 8.9–10.3)
Chloride: 109 mmol/L (ref 98–111)
Creatinine, Ser: 1.13 mg/dL — ABNORMAL HIGH (ref 0.44–1.00)
GFR, Estimated: 54 mL/min — ABNORMAL LOW (ref >60)
Glucose, Bld: 103 mg/dL — ABNORMAL HIGH (ref 70–99)
Potassium: 4.4 mmol/L (ref 3.5–5.1)
Sodium: 140 mmol/L (ref 135–145)

## 2023-06-26 LAB — MAGNESIUM: Magnesium: 2 mg/dL (ref 1.7–2.4)

## 2023-06-26 MED ORDER — METOPROLOL SUCCINATE ER 25 MG PO TB24
25.0000 mg | ORAL_TABLET | Freq: Every day | ORAL | Status: DC
  Administered 2023-06-27 – 2023-07-02 (×6): 25 mg via ORAL
  Filled 2023-06-26 (×6): qty 1

## 2023-06-26 NOTE — Progress Notes (Signed)
PROGRESS NOTE   Subjective/Complaints: No acute complaijnts. No events overnight.  Continues to make great gains with therapies, walked across the gym today! Weights down from last. Discussed transition to toprol in AM; patient and husband in agreement.  LBM 10/14   ROS: Ongoing dizziness, lethargy.-Improvinmg  Pertinent positives per HPI above.  Patient denies fever, rash, sore throat, blurred vision,  nausea, vomiting, diarrhea, cough, shortness of breath or chest pain, joint or back/neck pain, headache, or mood change.     Objective:   No results found. Recent Labs    06/24/23 0607  WBC 8.9  HGB 8.3*  HCT 25.6*  PLT 224     Recent Labs    06/24/23 0607 06/26/23 0618  NA 140 140  K 4.2 4.4  CL 110 109  CO2 23 24  GLUCOSE 106* 103*  BUN 32* 34*  CREATININE 1.13* 1.13*  CALCIUM 8.3* 8.1*     Intake/Output Summary (Last 24 hours) at 06/26/2023 0847 Last data filed at 06/26/2023 0801 Gross per 24 hour  Intake 960 ml  Output 1 ml  Net 959 ml     Pressure Injury 06/03/23 Buttocks Right Deep Tissue Pressure Injury - Purple or maroon localized area of discolored intact skin or blood-filled blister due to damage of underlying soft tissue from pressure and/or shear. purple (Active)  06/03/23 1740  Location: Buttocks  Location Orientation: Right  Staging: Deep Tissue Pressure Injury - Purple or maroon localized area of discolored intact skin or blood-filled blister due to damage of underlying soft tissue from pressure and/or shear.  Wound Description (Comments): purple  Present on Admission: Yes     Pressure Injury 06/03/23 Buttocks Left Deep Tissue Pressure Injury - Purple or maroon localized area of discolored intact skin or blood-filled blister due to damage of underlying soft tissue from pressure and/or shear. (Active)  06/03/23   Location: Buttocks  Location Orientation: Left  Staging: Deep Tissue  Pressure Injury - Purple or maroon localized area of discolored intact skin or blood-filled blister due to damage of underlying soft tissue from pressure and/or shear.  Wound Description (Comments):   Present on Admission: Yes    Physical Exam: Vital Signs Blood pressure (!) 142/84, pulse 78, temperature 98.1 F (36.7 C), resp. rate 18, height 5\' 2"  (1.575 m), weight 71 kg, SpO2 99%.  Constitutional: No distress . Vital signs reviewed. Sitting in gym working with PT.  Eyes: Horizontal nystagmus with gaze inb all directions -no longer present at rest 10/16. EOMI - with mild nystagmus in all directions. PERRLA Cardiovascular: RRR without murmur. No JVD  . Edema 1+ at hips.  Respiratory/Chest: CTA Bilaterally without wheezes or rales.  Comfortable on room air.    GI/Abdomen: BS +, non-tender, non-distended Ext: no clubbing, cyanosis, or edema Psych: pleasant and cooperative; happy with progress Skin: C/D/I. No apparent lesions.  Bilateral buttocks DTI as below-not examined today          Neurologic exam: alert and attentive Cognition: AAO to person, place, time and event.  +Mild cognitive, memory deficits -mild delayed recall - stable + Mild dysarthria - stable Insight: reasonable  insight into current condition.  Mood: Pleasant affect, appropriate  mood.  Strength: Moving all 4 extremities to gravity and resistance, 5-/5 LUE ataxia -ongoing, - considerable shaking when supporting in dynamic sitting with this arm   Assessment/Plan: 1. Functional deficits which require 3+ hours per day of interdisciplinary therapy in a comprehensive inpatient rehab setting. Physiatrist is providing close team supervision and 24 hour management of active medical problems listed below. Physiatrist and rehab team continue to assess barriers to discharge/monitor patient progress toward functional and medical goals  Care Tool:  Bathing    Body parts bathed by patient: Right arm, Left arm, Chest,  Abdomen, Right upper leg, Left upper leg, Face, Front perineal area   Body parts bathed by helper: Left lower leg, Right lower leg, Buttocks, Front perineal area     Bathing assist Assist Level: Moderate Assistance - Patient 50 - 74%     Upper Body Dressing/Undressing Upper body dressing   What is the patient wearing?: Pull over shirt    Upper body assist Assist Level: Contact Guard/Touching assist    Lower Body Dressing/Undressing Lower body dressing      What is the patient wearing?: Underwear/pull up, Pants     Lower body assist Assist for lower body dressing: Moderate Assistance - Patient 50 - 74%     Toileting Toileting    Toileting assist Assist for toileting: Moderate Assistance - Patient 50 - 74%     Transfers Chair/bed transfer  Transfers assist  Chair/bed transfer activity did not occur: Safety/medical concerns  Chair/bed transfer assist level: Contact Guard/Touching assist     Locomotion Ambulation   Ambulation assist      Assist level: Minimal Assistance - Patient > 75% Assistive device: Walker-rolling Max distance: 45'   Walk 10 feet activity   Assist  Walk 10 feet activity did not occur: Safety/medical concerns  Assist level: Contact Guard/Touching assist Assistive device: Walker-rolling   Walk 50 feet activity   Assist Walk 50 feet with 2 turns activity did not occur: Safety/medical concerns  Assist level: Minimal Assistance - Patient > 75% Assistive device: Walker-rolling    Walk 150 feet activity   Assist Walk 150 feet activity did not occur: Safety/medical concerns         Walk 10 feet on uneven surface  activity   Assist Walk 10 feet on uneven surfaces activity did not occur: Safety/medical concerns         Wheelchair     Assist Is the patient using a wheelchair?: Yes Type of Wheelchair: Manual    Wheelchair assist level: Dependent - Patient 0%      Wheelchair 50 feet with 2 turns  activity    Assist        Assist Level: Dependent - Patient 0%   Wheelchair 150 feet activity     Assist      Assist Level: Dependent - Patient 0%   Blood pressure (!) 142/84, pulse 78, temperature 98.1 F (36.7 C), resp. rate 18, height 5\' 2"  (1.575 m), weight 71 kg, SpO2 99%.   Medical Problem List and Plan: 1. Functional deficits secondary to debility after cardiac arrest and multiple related complications             -patient may shower             -ELOS/Goals: 12-14 days, min assist with PT, OT (now CGA/SPV goals), SLP - discharge date 10/22             -pt has episodes of fatigue/somnolence at baseline per husband. He  says she does better in the later morning and early afternoon.   -Continue CIR therapies including PT, OT, and SLP    - 10/15: walking up to 68 feet; working on cognitive engagement; 13 step goal per family not realistic, has 1st floor setup. D/w family purchasing Roho to prevent skin breakdown.    - 10/10: Reached out to palliative, requesting they touch base with patient and family again prior to discharge 10-22 regarding lingering questions on caregiver role and end-of-life  10/12: Worsening overall dizziness/vertigo and ataxia, progressive over last 2 days; patient opts to wait 1 more day for repeat imaging to evaluate -self reports symptoms improved 10-13; ongoing   2.  Antithrombotics: -DVT/anticoagulation:  Mechanical:  Antiembolism stockings, knee (TED hose) Bilateral lower extremities (age indeterminate left posterior tib DVT). 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             -antiplatelet therapy: none   3. Pain Management: Tylenol, Norco, oxycodone as needed -Lidoderm patch             -Oxycontin 10 mg q 12 hours             -observe for further sedation. Was somnolent this morning after receiving pain medication.   -10-3:  Continue OxyContin 10 mg every 12 hours (is on 13.5 mg every 12 at home) just short acting to home regimen oxycodone 5 mg every 4 hours as needed     - 10/8: Add PRN methocarbamol for muscle soreness -  not used    10/5-6 pain appears controlled.  4. Mood/Behavior/Sleep: LCSW to evaluate and provide emotional support             -antipsychotic agents: Zyprexa 2.5 mg q HS-at home dose             -Xanax prn -at home dose  -10/14 as needed melatonin 3 mg - improved   5. Neuropsych/cognition: This patient is capable of making decisions on his own behalf.   6. Skin/Wound Care: Routine skin care checks   -10-1: Family inquiring about protein supplementation, will look into supplements given on inpatient.   7. Fluids/Electrolytes/Nutrition: Routine Is and Os and follow-up chemistries   -10-1: See below, potassium borderline, magnesium repleted today.  Rechecking in AM.   - 10-2: Electrolytes repleted, labs normal, monitor  - 10/4: Encourage p.o. fluids due to tachycardia today; BMP in a.m.  -10/6 Potassium 3.2--increase supplement  -BUN/Cr holding fairly steady--continue to push fluids -10-7: Potassium 3.8.  Continue current supplement.  BUN/creatinine stable.  - labs in AM for BMP, Mag given soreness -stable 1/9; AKI 10-12; repeat Monday -10/14 Potassium stable at 4.2    8: Hypertension: monitor TID and prn (home Norvasc held)   - Remains normotensive, monitor with addition of spironolactone/ BMP  - change to 10/11 given symptoms   - 10/12: BMP with AKI; increase in BP may be from steroids vs. Contracting; reduce spirinolactone to 12.5 mg daily and repeat labs Monday  06-25-15: slightly hypertnesive, monitor trend, likely d/t steroids. Switch to toprol tomorrow AM.      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  Will monitor for any functional decline, and may reach out to neuro-oncology for prolonged taper depending on symptom severity.  10/10: Finished Decadron taper, discussed with family notifying provider if any worsening symptoms of dizziness, ataxia, or lethargy with this.  If needed to resume, will reach out to Dr. Barbaraann Cao --> resumed 1 mg daily until OP f/u   10: Bilateral rib fractures due to chest compressions -pain control as above   11: V-fib cardiac arrest:  -continue Pacerone 400 mg daily for one week, then 200 mg daily on 10/08--Dropped off 10/15 ? transition, resumed 10/15, will touch base with cardiology regarding plans for transition to BB -> start toprol xl 25 mg 10/17 AM              -not a candidate for invasive intervention             -K > 4; Mg > 2 -potassium 3.8 on admission labs, recheck in AM.  Magnesium 1.6, replete 2 g IV today and then start 400 mg twice daily due to recurrent drops.  Recheck in AM.             -if patient needs diuretics outpatient due to lower extremity edema then cardiology will consider changing to a potassium sparing diuretic such as spironolactone 25 mg daily along with some potassium supplementation and magnesium             -follow-up with Dr. Lynnette Caffey 3 weeks after D/C from CIR    - Daily weights - reodered 10/11 - downtrending  -10/6 supplement K+  -10-9: Reminding nursing to perform daily weights.will have cards eval today to initiate diuretic and adjust meds given ongoing swelling and mild hypotension  10/10: Cardiology resumed spironolactone 25 mg daily due to peripheral edema, appears to be helping.     10/12: reduce to 12.5 mg daily d/t AKI  10/15: D/w nursing re: resuming daily weights. May need to increase diuretics again d/t swelling; monitorring closely.   Filed Weights   06/15/23 0316 06/16/23 0709 06/25/23 1700   Weight: 69.8 kg 73.8 kg 71 kg     12: Stage IV lung cancer/mets to brain:             -continue Keppra 1000 mg BID             -follow-up with Dr. 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 fluids, labs stable, f/u monday  10-7: Creatinine stabilizing at 1.1-1.2; unchange d10-9;  - 10/11-10/12 0.96-> 1.37 Cr with improved BUN; reduce spirinolactone to 12.5 mg  daily.  -10/14 creatinine down to 1.13 although BUN increased to 32, continue to encourage oral fluid intake.   - recheck 10/16 0 1.13, stable  17: Anemia: ~3 gram drop in hemoglobin/no overt bleeding             -follow-up CBC. Most recently has been ~7.7; stable on admission labs             -check Hemoccult X 3 -pending             -has hemorrhoids, constipated             -continue PPI, Pepcid   - hgb stable at 7.8 on 10/5; greater than 8 10/7  -10/14 Hgb stable at 8.3   18: DM: A1c = 7.3%, also on Decadron             -start CBGs QID and SSI             -carb modified diet as tolerated  Blood sugars well-controlled, monitor  -10-3: Mild elevation blood sugars over the last few days, likely with improved diet; not on any diabetic regimen as outpatient.  Monitor 1 day and consider initiating low-dose metformin if no improvement.   - 10-4: Patient working on dietary adjustments, mildly improved.  Continue to monitor Recent Labs     06/25/23 1648 06/25/23 2114 06/26/23 0617  GLUCAP 124* 152* 94    10/6 sugars have been elevated due to steroids--decadron down to 1mg  daily and readings improved except for readings around dinner time   -continue current cbg checks and SSI for now   -may be able to stop these soon  10/14-15 CBGs well-controlled continue current regimen; elevated d/t steroids    19: Prior post fossa craniotomy for tumor resection 12/02/2020: resection of progressive cerebellar mass by Dr. Venetia Maxon. 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 Has chronic intermittent dizziness>>meclizine prn   - well controlled, no current complaints  -10/5 scheduled meclizine 25mg  tid per pt request -> increase to 50 mg TID 10/11; this was OP dose  - Had prior rxn to scopalamine  - 10/11: Symptoms worsening s/p steroid taper, messaged Dr. Barbaraann Cao regarding resuming 1 mg daily until OP follow up --> he is in agreement with this, resumed 10/11   - 10/12: Symptoms worsening overall; see above, if continuing tomorrow will re-image    - 10/13: Improved, defer further imaging to neuro-oncology/oncology OP   20: External hemorrhoids: continue topical hydrocortisone 2.5%; reordered today, as suppository was prior ordered.   21: Intermittent constipation: check for impaction then decide on appropriate regimen  10-1: Multiple small bowel movements overnight, add MiraLAX 17 mg daily  10-2: Small brown bowel movement this a.m., mushy consistency - improved  Last bowel movement 10/4--hopeful for BM today 10/6  Small bowel movement 10-7, increase MiraLAX to twice daily and add as needed Senokot per family request  10-14 last BM today improved      LOS: 16 days A FACE TO FACE EVALUATION WAS PERFORMED  Virginia [name] 06/26/2023, 8:47 AM

## 2023-06-26 NOTE — Progress Notes (Signed)
Occupational Therapy Session Note  Patient Details  Name: Virginia [name] MRN: [id number] Date of Birth: [birth year]  Today's Date: 06/26/2023 OT Individual Time: 6295-2841 OT Individual Time Calculation (min): 71 min    Short Term Goals: Week 3:  OT Short Term Goal 1 (Week 3): LTG=STG 2/2 ELOS  Skilled Therapeutic Interventions/Progress Updates:  Pt greeted seated in w/c, pt agreeable to OT intervention.      Transfers/bed mobility: pt completed stand pivot to nustep with Rw and CGA. Pt completed sit>stands with RW and CGA, MIN verbal cues needed for hand placement when standing. Pt completed ~ 15 ft of functional mobility with RW, noted shakiness in LLE with pt taking short guarded steps.   Therapeutic activity: pt completed and 30 secs on nustep at level 2 workload to facilitate improved global strengthening and endurance for higher level functional mobility tasks.   Pt completed standing tolerance task with pt instructed to use LUE to engage in memory matching game. Pt instructed to flip over 2 blocks at a time to find a match, pt completed task in standing for 2 mins with CGA. Pt needed MOD verbal cues for sequencing of task/rules of game as pt wanting to flip over all blocks at once. Pt did become fatigued, needing to sit down and rest.   Pt completed seated FMC task with pt instructed to duplicate block structure from visual aid provided with LUE. Pt needed step by step cues to sequence block pattern. Pt able to complete task with MAX step by step cues to sequence pattern.   Ended session with pt in husbands care transporting pt back to room.           Therapy Documentation Precautions:  Precautions Precautions: Fall Restrictions Weight Bearing Restrictions: No  Pain: Unrated pain reported in chest, rest breaks provided as needed.    Therapy/Group: Individual Therapy  [name] 06/26/2023, 12:09 PM

## 2023-06-26 NOTE — Progress Notes (Signed)
Physical Therapy Session Note  Patient Details  Name: Virginia [name] MRN: [id number] Date of Birth: 1956/08/07  Today's Date: 06/26/2023 PT Individual Time: [phone] and 1120-1200 PT Individual Time Calculation (min): 69 min and 40 min  Short Term Goals: Week 3:  PT Short Term Goal 1 (Week 3): STG = LTG due to ELOS  Skilled Therapeutic Interventions/Progress Updates:     Pt received seated in [facility] and agrees to therapy. No complaint of pain. WC transport to gym for time management. Pt performs x20 LAQs with each leg to accustom muscles to engagement and provide motor control challenge, with emphasis on eccentric control. Pt performs sit to stand with minA/modA and cues for posture and body mechanics. Pt remains standing ~30 seconds but verbalizes increasing dizziness and requests to sit back down. Following rest break, pt stands with similar assistance, then gradually initiates ambulation and completes x35' with Rt HHA, modA overall, with cues for posture and step sequencing. Seated rest break. RN provides anxiety meds during break. Pt then completes standing activity without AD to challenge dynamic standing balance, coordination, and endurance. Pt stands with minA and remains standing with minA/modA, reaching for bean bags with RUE and cued to step forward with RLE as she tosses bag to promote weight shifting and single leg stance on LLE. Pt completes multiple bouts with seated rest breaks. WC transport back to room. Left seated with all needs within reach.   2nd Session: Pt received seated in [facility] and agrees to therapy. No complaint of pain. WC transport to gym. Pt performs multiple reps of sit to stand in parallel bars with CGA and cues for initiation and sequencing. Mirror provided for visual feedback. Pt performs x5 foot taps with each foot, alternating, on 10" platform to promote increased hip flexion, single leg stance, and coordination challenge. Sated rest break. Pt then completes  sidestepping to the Lt and Rt x5' each, with cues for neutral hip rotation, posture, and downward rotation and depression of Lt scapulae for improved body mechanics.   Following rest break, pt completes stand step transfer to mat table with minA and cues for positioning. Pt completes 2x5 sit to stand repetitions with cues for anterior weight shifting posture, neutral posture, and hip extension. Pt performs sideleaning to Lt and Rt with WB through elbows to promote loading through Lt hemibody, as well as mobility training and reciprocal coordination training.   Stand step back to WC. WC transport back to room. Left seated with all needs within reach.   Therapy Documentation Precautions:  Precautions Precautions: Fall Restrictions Weight Bearing Restrictions: No   Therapy/Group: Individual Therapy  Beau [name], PT, DPT 06/26/2023, 4:05 PM

## 2023-06-26 NOTE — Progress Notes (Signed)
Speech Language Pathology Daily Session Note  Patient Details  Name: Virginia [name] MRN: [id number] Date of Birth: 04/09/1956  Today's Date: 06/26/2023 SLP Individual Time: 9629-5284 SLP Individual Time Calculation (min): 44 min  Short Term Goals: Week 3: SLP Short Term Goal 1 (Week 3): STGs=LTGs due to ELOS  Skilled Therapeutic Interventions: SLP conducted skilled therapy session targeting cognitive retraining goals and peripheral management of swallowing status. Patient greeted upright in wheelchair, eating salad for lunch with no overt difficulty. Patient requested assistance with clearing table to make room for SLP activity. During functional activity, patient benefited from minA to attend to the left of task and min-modA to sequence basic functional tasks. Patient required modA to identify problems during basic sequencing task and benefited most from verbal presentation of visually presented information. Patient disoriented to date, requiring reorientation to calendar aid present in room. Across session, patient taking sequential sips of liquids from straw with SLP noting immediate cough after each bolus. Patient's husband indicated concern, as he has noted throat clearing/coughing after sequential sips with patient's coffee and Miralax. SLP observed further trials, noting immediate cough after 100% of sequential sips. With single sips and cup sips, no clinical s/sx of penetration/aspiration observed. Recommend use of single sip strategy. Updated family member present and room sign with recommendations. Patient was left in chair with call bell in reach and chair alarm set. SLP will continue to target goals per plan of care.      Pain Pain Assessment Pain Scale: Faces Pain Score: 7  Faces Pain Scale: Hurts little more Pain Type: Chronic pain Pain Location: Back Pain Orientation: Mid Pain Descriptors / Indicators: Aching Pain Frequency: Constant Pain Onset: On-going Pain  Intervention(s): Medication (See eMAR)  Therapy/Group: Individual Therapy  Jeannie Done, M.A., CCC-SLP  Yetta Barre 06/26/2023, 2:50 PM

## 2023-06-26 NOTE — Progress Notes (Signed)
Recreational Therapy Session Note  Patient Details  Name: Virginia [name] MRN: [id number] Date of Birth: 29-Aug-1956 Today's Date: 06/26/2023  Pain: no c/o Skilled Therapeutic Interventions/Progress Updates: Pt participated in animal assisted activity seated w/c level. Pt easily engaged with pet partner team and appreciated this visit.  Pts husband was present & participatory as well.  Virginia [name] 06/26/2023, 1:27 PM

## 2023-06-27 LAB — GLUCOSE, CAPILLARY
Glucose-Capillary: 91 mg/dL (ref 70–99)
Glucose-Capillary: 106 mg/dL — ABNORMAL HIGH (ref 70–99)
Glucose-Capillary: 117 mg/dL — ABNORMAL HIGH (ref 70–99)
Glucose-Capillary: 273 mg/dL — ABNORMAL HIGH (ref 70–99)

## 2023-06-27 MED ORDER — SENNOSIDES-DOCUSATE SODIUM 8.6-50 MG PO TABS: 2.0000 | ORAL_TABLET | Freq: Every day | ORAL | Status: DC | PRN

## 2023-06-27 NOTE — Progress Notes (Signed)
Speech Language Pathology Daily Session Note  Patient Details  Name: Virginia [name] MRN: [id number] Date of Birth: [birth year]  Today's Date: 06/27/2023 SLP Individual Time: 4098-1191 SLP Individual Time Calculation (min): 30 min  Short Term Goals: Week 3: SLP Short Term Goal 1 (Week 3): STGs=LTGs due to ELOS  Skilled Therapeutic Interventions: Skilled treatment session focused on cognitive and communication goals. Upon arrival, patient was awake in wheelchair. Patient requested assistance with creating a new playlist on an app on her phone. SLP facilitated session by providing overall Mod A verbal cues for use of memory strategies to maximize recall with sequencing of steps. Increased cueing needed due to complexity and number of steps needed to complete task. Throughout session, patient was 100% intelligible at the phrase and sentence level with intermittent supervision level verbal cues needed for use of compensatory strategies. Patient left upright in wheelchair with alarm on and all needs within reach. Continue with current plan of care.       Pain Pain Assessment Pain Scale: 0-10 Pain Score: 6  Pain Type: Chronic pain Pain Location: Back Pain Orientation: Mid Pain Descriptors / Indicators: Aching Pain Frequency: Constant Pain Onset: On-going Patients Stated Pain Goal: 3 Pain Intervention(s): Medication (See eMAR)  Therapy/Group: Individual Therapy  Othello Sgroi 06/27/2023, 10:49 AM

## 2023-06-27 NOTE — Progress Notes (Signed)
Occupational Therapy Session Note  Patient Details  Name: Virginia [name] MRN: [id number] Date of Birth: October 13, 1955  Today's Date: 06/27/2023 OT Individual Time: 1000-1113 OT Individual Time Calculation (min): 73 min    Short Term Goals: Week 3:  OT Short Term Goal 1 (Week 3): LTG=STG 2/2 ELOS  Skilled Therapeutic Interventions/Progress Updates:    Pt greeted seated in wc and agreeable to OT treatment session focused on self-care retraining at shower level. Pt needed increased time to ambulate into bathroom w/ RW and min A. Pt reported feeling nervous to shower without Stedy, but reminded her that we have not been using the [facility] for shower or toilet transfers during therapy. Pt able to get B LE's into figure 4 position to doff socks, and wash feet. Sit<>stand in shower with CGA to wash buttocks using grab bars to stand. Pt needed cues throughout shower to correct lateral lean to the R. Stand-pivot out of shower with CGA. Blocked practice for UB dressing tasks as pt with difficulty orienting shift and kept getting confused with hemi dressing strategies. Eventually, after practice and repetition, she was able to don her shirt with min A. Figure 4 position to thread pants with min A. Addressed standing balance/endurance with standing clothing management. Pt stood with min A and min A for balance while alternating UE support to pull up pants. Pt needed min A for weight shifting and balance to reach and don socks in figure 4 Pt completed grooming tasks in sittting with set-up A. Pt left seated in wc at end of session with chair alarm on, call bell in reach, and needs met.   Therapy Documentation Precautions:  Precautions Precautions: Fall Restrictions Weight Bearing Restrictions: No Pain: Pain Assessment Pain Scale: 0-10 Pain Score: 6  Pain Type: Chronic pain Pain Location: Back Pain Orientation: Mid Pain Descriptors / Indicators: Aching Pain Frequency: Constant Pain Onset:  On-going Patients Stated Pain Goal: 3 Pain Intervention(s):Rest and repositioned   Therapy/Group: Individual Therapy  Mal Amabile 06/27/2023, 11:07 AM

## 2023-06-27 NOTE — Progress Notes (Signed)
PROGRESS NOTE   Subjective/Complaints: No acute complaijnts. No events overnight. Vitals stable. Pain well controlled.  Patient has no concerns today, did discuss increasing edema in her lower extremities and enforce compliance with TED hose when out of bed. LBM 10/16   ROS: Ongoing dizziness, lethargy.-Improving; swelling-ongoing Pertinent positives per HPI above.  Patient denies fever, rash, sore throat, blurred vision,  nausea, vomiting, diarrhea, cough, shortness of breath or chest pain, joint or back/neck pain, headache, or mood change.     Objective:   No results found. No results for input(s): "WBC", "HGB", "HCT", "PLT" in the last 72 hours.    Recent Labs    06/26/23 0618  NA 140  K 4.4  CL 109  CO2 24  GLUCOSE 103*  BUN 34*  CREATININE 1.13*  CALCIUM 8.1*     Intake/Output Summary (Last 24 hours) at 06/27/2023 1031 Last data filed at 06/27/2023 0816 Gross per 24 hour  Intake 476 ml  Output --  Net 476 ml     Pressure Injury 06/03/23 Buttocks Right Deep Tissue Pressure Injury - Purple or maroon localized area of discolored intact skin or blood-filled blister due to damage of underlying soft tissue from pressure and/or shear. purple (Active)  06/03/23 1740  Location: Buttocks  Location Orientation: Right  Staging: Deep Tissue Pressure Injury - Purple or maroon localized area of discolored intact skin or blood-filled blister due to damage of underlying soft tissue from pressure and/or shear.  Wound Description (Comments): purple  Present on Admission: Yes     Pressure Injury 06/03/23 Buttocks Left Deep Tissue Pressure Injury - Purple or maroon localized area of discolored intact skin or blood-filled blister due to damage of underlying soft tissue from pressure and/or shear. (Active)  06/03/23   Location: Buttocks  Location Orientation: Left  Staging: Deep Tissue Pressure Injury - Purple or maroon  localized area of discolored intact skin or blood-filled blister due to damage of underlying soft tissue from pressure and/or shear.  Wound Description (Comments):   Present on Admission: Yes    Physical Exam: Vital Signs Blood pressure (!) 147/82, pulse 79, temperature 97.7 F (36.5 C), temperature source Oral, resp. rate 17, height 5\' 2"  (1.575 m), weight 70.4 kg, SpO2 100%.  Constitutional: No distress . Vital signs reviewed. Sitting at bedside, tired. Eyes: Horizontal nystagmus with gaze inb all directions -no longer present at rest 10/16-17. EOMI - with mild nystagmus in all directions. PERRLA Cardiovascular: RRR without murmur. No JVD  . Edema 1+ at hips.  Respiratory/Chest: CTA Bilaterally without wheezes or rales.  Comfortable on room air.    GI/Abdomen: BS +, non-tender, non-distended Ext: no clubbing, cyanosis; 1+ bilateral pitting edema at the ankles, 2+ at the right hip Psych: pleasant and cooperative, lethargic Skin: C/D/I. No apparent lesions.  Bilateral buttocks DTI as below-not examined today          Neurologic exam: alert and attentive Cognition: AAO to person, place, time and event.  + Mild cognitive, memory deficits -mild delayed recall - stable + Mild dysarthria - stable, changed Insight: Good insight into current condition.  Strength: Moving all 4 extremities to gravity and resistance in bedside chair, 5-  out of 5 left upper extremity, 5 out of 5 all others LUE ataxia -ongoing,   Assessment/Plan: 1. Functional deficits which require 3+ hours per day of interdisciplinary therapy in a comprehensive inpatient rehab setting. Physiatrist is providing close team supervision and 24 hour management of active medical problems listed below. Physiatrist and rehab team continue to assess barriers to discharge/monitor patient progress toward functional and medical goals  Care Tool:  Bathing    Body parts bathed by patient: Right arm, Left arm, Chest, Abdomen, Right  upper leg, Left upper leg, Face, Front perineal area   Body parts bathed by helper: Left lower leg, Right lower leg, Buttocks, Front perineal area     Bathing assist Assist Level: Moderate Assistance - Patient 50 - 74%     Upper Body Dressing/Undressing Upper body dressing   What is the patient wearing?: Pull over shirt    Upper body assist Assist Level: Contact Guard/Touching assist    Lower Body Dressing/Undressing Lower body dressing      What is the patient wearing?: Underwear/pull up, Pants     Lower body assist Assist for lower body dressing: Moderate Assistance - Patient 50 - 74%     Toileting Toileting    Toileting assist Assist for toileting: Moderate Assistance - Patient 50 - 74%     Transfers Chair/bed transfer  Transfers assist  Chair/bed transfer activity did not occur: Safety/medical concerns  Chair/bed transfer assist level: Contact Guard/Touching assist     Locomotion Ambulation   Ambulation assist      Assist level: Minimal Assistance - Patient > 75% Assistive device: Walker-rolling Max distance: 75'   Walk 10 feet activity   Assist  Walk 10 feet activity did not occur: Safety/medical concerns  Assist level: Contact Guard/Touching assist Assistive device: Walker-rolling   Walk 50 feet activity   Assist Walk 50 feet with 2 turns activity did not occur: Safety/medical concerns  Assist level: Minimal Assistance - Patient > 75% Assistive device: Walker-rolling    Walk 150 feet activity   Assist Walk 150 feet activity did not occur: Safety/medical concerns         Walk 10 feet on uneven surface  activity   Assist Walk 10 feet on uneven surfaces activity did not occur: Safety/medical concerns         Wheelchair     Assist Is the patient using a wheelchair?: Yes Type of Wheelchair: Manual    Wheelchair assist level: Dependent - Patient 0%      Wheelchair 50 feet with 2 turns activity    Assist         Assist Level: Dependent - Patient 0%   Wheelchair 150 feet activity     Assist      Assist Level: Dependent - Patient 0%   Blood pressure (!) 147/82, pulse 79, temperature 97.7 F (36.5 C), temperature source Oral, resp. rate 17, height 5\' 2"  (1.575 m), weight 70.4 kg, SpO2 100%.   Medical Problem List and Plan: 1. Functional deficits secondary to debility after cardiac arrest and multiple related complications             -patient may shower             -ELOS/Goals: 12-14 days, min assist with PT, OT (now CGA/SPV goals), SLP - discharge date 10/22             -pt has episodes of fatigue/somnolence at baseline per husband. He says she does better in the later morning  and early afternoon.   -Continue CIR therapies including PT, OT, and SLP    - 10/15: walking up to 68 feet; working on cognitive engagement; 13 step goal per family not realistic, has 1st floor setup. D/w family purchasing Roho to prevent skin breakdown.    - 10/10: Reached out to palliative, requesting they touch base with patient and family again prior to discharge 10-22 regarding lingering questions on caregiver role and end-of-life  10/12: Worsening overall dizziness/vertigo and ataxia, progressive over last 2 days; patient opts to wait 1 more day for repeat imaging to evaluate -self reports symptoms improved 10-13; ongoing   2.  Antithrombotics: -DVT/anticoagulation:  Mechanical:  Antiembolism stockings, knee (TED hose) Bilateral lower extremities (age indeterminate left posterior tib DVT). 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             -antiplatelet therapy: none  -10-17: Reinforced use of TED hose when out of bed for increasing edema   3. Pain Management: Tylenol, Norco, oxycodone as needed -Lidoderm patch             -Oxycontin 10 mg q 12 hours             -observe for further sedation. Was  somnolent this morning after receiving pain medication.   -10-3: Continue OxyContin 10 mg every 12 hours (is on 13.5 mg every 12 at home) just short acting to home regimen oxycodone 5 mg every 4 hours as needed     - 10/8: Add PRN methocarbamol for muscle soreness -  not used    10/5-6 pain appears controlled.  4. Mood/Behavior/Sleep: LCSW to evaluate and provide emotional support             -antipsychotic agents: Zyprexa 2.5 mg q HS-at home dose             -Xanax prn -at home dose  -10/14 as needed melatonin 3 mg - improved   5. Neuropsych/cognition: This patient is capable of making decisions on his own behalf.   6. Skin/Wound Care: Routine skin care checks   -10-1: Family inquiring about protein supplementation, will look into supplements given on inpatient.   7. Fluids/Electrolytes/Nutrition: Routine Is and Os and follow-up chemistries   -10-1: See below, potassium borderline, magnesium repleted today.  Rechecking in AM.   - 10-2: Electrolytes repleted, labs normal, monitor  - 10/4: Encourage p.o. fluids due to tachycardia today; BMP in a.m.  -10/6 Potassium 3.2--increase supplement  -BUN/Cr holding fairly steady--continue to push fluids -10-7: Potassium 3.8.  Continue current supplement.  BUN/creatinine stable.  - labs in AM for BMP, Mag given soreness -stable 1/9; AKI 10-12; repeat Monday -10/14 Potassium stable at 4.2 -10-17: CMP, magnesium in a.m.; concerned with albumin, anasarca contributing to poor edema management    8: Hypertension: monitor TID and prn (home Norvasc held)   - Remains normotensive, monitor with addition of spironolactone/ BMP  - change to 10/11 given symptoms   - 10/12: BMP with AKI; increase in BP may be from steroids vs. Contracting; reduce spirinolactone to 12.5 mg daily and repeat labs Monday  06-25-15: slightly hypertnesive, monitor trend, likely d/t steroids. Switch to toprol tomorrow AM.   10-17: Tolerated dose of Toprol.  Pending labs in a.m., may  increase spironolactone back to 25 mg daily for edema management.     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  Will monitor for any functional decline, and may reach out to neuro-oncology for prolonged taper depending on symptom severity.  10/10: Finished Decadron taper, discussed with family notifying provider if any worsening symptoms of dizziness, ataxia, or lethargy with this.  If needed to resume, will reach out to Dr. Barbaraann Cao --> resumed 1 mg daily until OP f/u   10: Bilateral rib fractures due to chest compressions -pain control as above   11: V-fib cardiac arrest:  -continue Pacerone 400 mg daily for one week, then 200 mg daily on 10/08--Dropped off 10/15 ? transition, resumed 10/15, will touch base with cardiology regarding plans for transition to BB -> start toprol xl 25 mg 10/17 AM              -not a candidate for invasive intervention             -K > 4; Mg > 2 -potassium 3.8 on admission labs, recheck in AM.  Magnesium 1.6, replete 2 g IV today and then start 400 mg twice daily due to recurrent drops.  Recheck in AM.             -if patient needs diuretics outpatient due to lower extremity edema then cardiology will consider changing to a potassium sparing diuretic such as spironolactone 25 mg daily along with some potassium supplementation and magnesium             -follow-up with Dr. Lynnette Caffey 3 weeks after D/C from CIR    - Daily weights - reodered 10/11 - downtrending  -10/6 supplement K+  -10-9: Reminding nursing to perform daily weights.will have cards eval today to initiate diuretic and adjust meds given ongoing swelling and mild hypotension  10/10:  Cardiology resumed spironolactone 25 mg daily due to peripheral edema, appears to be helping.     10/12: reduce to 12.5 mg daily d/t AKI  10/15: D/w nursing re: resuming daily weights. May need to increase diuretics again d/t swelling; monitorring closely.   10-17: Weights remain downtrending/stable.,  Increased peripheral edema on exam.  Unsure if due to poor nutritional status or truly volume overloaded.  CMP in AM.  Encouraged TED hose.  Filed Weights   06/16/23 0709 06/25/23 1700 06/27/23 0600  Weight: 73.8 kg 71 kg 70.4 kg     12: Stage IV lung cancer/mets to brain:             -continue Keppra 1000 mg BID             -follow-up with Dr. 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 fluids, labs stable, f/u monday  10-7: Creatinine stabilizing at 1.1-1.2; unchange d10-9;  - 10/11-10/12 0.96-> 1.37 Cr with improved BUN; reduce spirinolactone to 12.5 mg daily.  -10/14 creatinine down to 1.13 although BUN increased to 32, continue to encourage oral fluid intake.   - recheck 10/16 0 1.13, stable  17: Anemia: ~3 gram drop in hemoglobin/no overt bleeding             -follow-up CBC. Most recently has been ~7.7; stable on admission labs             -check Hemoccult X 3 -pending             -has hemorrhoids, constipated             -continue PPI, Pepcid   - hgb stable at 7.8 on 10/5; greater than 8  10/7  -10/14 Hgb stable at 8.3   18: DM: A1c = 7.3%, also on Decadron             -start CBGs QID and SSI             -carb modified diet as tolerated  Blood sugars well-controlled, monitor  -10-3: Mild elevation blood sugars over the last few days, likely with improved diet; not on any diabetic regimen as outpatient.  Monitor 1 day and consider initiating low-dose metformin if no improvement.   - 10-4: Patient working on dietary adjustments, mildly improved.  Continue to monitor Recent Labs    06/26/23 1716 06/26/23 2140 06/27/23 0607  GLUCAP 86 129* 91    10/6 sugars have been elevated due to steroids--decadron down to 1mg  daily and readings improved except for readings around dinner time   -continue current cbg checks and SSI for now   -may be able to stop these soon  10/14-17 CBGs well-controlled continue current regimen; elevated d/t steroids    19: Prior post fossa craniotomy for tumor resection 12/02/2020: resection of progressive cerebellar mass by Dr. Venetia Maxon. 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 Has chronic intermittent dizziness>>meclizine prn-is well-controlled   - well controlled, no current complaints  -10/5 scheduled meclizine 25mg  tid per pt request -> increase to 50 mg TID 10/11; this was OP dose  - Had prior rxn to scopalamine  - 10/11: Symptoms worsening s/p steroid taper, messaged Dr. Barbaraann Cao regarding resuming 1 mg daily until OP follow up --> he is in agreement with this, resumed 10/11   - 10/12: Symptoms worsening overall; see above, if continuing tomorrow will re-image    - 10/13: Improved, defer further imaging to neuro-oncology/oncology OP   20: External hemorrhoids: continue topical hydrocortisone 2.5%; reordered today, as suppository was prior ordered.-Was well-controlled   21: Intermittent constipation: check for impaction then decide on appropriate regimen-resolved  10-1: Multiple small bowel movements overnight, add MiraLAX 17 mg daily  10-2: Small brown bowel  movement this a.m., mushy consistency - improved  Last bowel movement 10/4--hopeful for BM today 10/6  Small bowel movement 10-7, increase MiraLAX to twice daily and add as needed Senokot per family request  10-16 last BM       LOS: 17 days A FACE TO FACE EVALUATION WAS PERFORMED  Angelina Sheriff 06/27/2023, 10:31 AM

## 2023-06-27 NOTE — Progress Notes (Signed)
Recreational Therapy Session Note  Patient Details  Name: Virginia [name] MRN: [id number] Date of Birth: 05/28/1956 Today's Date: 06/27/2023  Pain: no c/o Skilled Therapeutic Interventions/Progress Updates: Pt will participate in therapeutic dance for 60 minutes.  MET  Pt participated in dance group with an emphasis on social interaction, motor planning, increasing overall activity tolerance and bimanual tasks. All songs were selected by group members. Dance moves included AROM of BUE/BLE gross motor movements with an emphasis on building functional endurance.      Individual level documentation: Patient completed group from sitting level. Patient needed supervision to complete various dance moves.  Patient needed minimal modifications during group.   Pain:no c/o   Rowdy Guerrini 06/27/2023, 3:58 PM

## 2023-06-27 NOTE — Group Note (Signed)
Patient Details Name: Virginia [name] MRN: [id number] DOB: 04-Apr-1956 Today's Date: 06/27/2023  Time Calculation: OT Group Time Calculation OT Group Start Time: 1430 OT Group Stop Time: 1530 OT Group Time Calculation (min): 60 min      Group Description: Dance Group: Pt participated in dance group with an emphasis on social interaction, motor planning, increasing overall activity tolerance and bimanual tasks. All songs were selected by group members. Dance moves included AROM of BUE/BLE gross motor movements with an emphasis on building functional endurance.    Individual level documentation: Patient completed group from sitting level. Patientt needed supervision to complete various dance moves with cues body positioning.  Patient able to create her own modifications during group.  Pain: 0/10  Precautions:  Falls   Clide Deutscher 06/27/2023, 3:58 PM

## 2023-06-27 NOTE — Progress Notes (Signed)
Physical Therapy Session Note  Patient Details  Name: Virginia [name] MRN: [id number] Date of Birth: 07-Dec-1955  Today's Date: 06/27/2023 PT Individual Time: 1124-1207 PT Individual Time Calculation (min): 43 min   Short Term Goals: Week 3:  PT Short Term Goal 1 (Week 3): STG = LTG due to ELOS  Skilled Therapeutic Interventions/Progress Updates:    Pt presents in room in [facility], agreeable to PT. Pt reporting some pain at this time, RN present at start of session and reporting pt premedicated and pt has taken anxiety medication. Session focused on gait training with RW for tolerance to upright and RW mgmt as well as therapeutic exercise to promote tolerance to activity.  Pt transported to dayroom dependently for time management and energy conservation. Pt ambulates with RW 35' min assist, pt with decreased proximity to RW and increased R lateral lean requiring increased assist,  requires rescue  sit from 2nd person due to pt unable to maintain balance to complete ambulation back to chair.  Pt then completes transfer to nustep, requires increased cues for proximity to RW, min assist for transfer. Pt tolerates continuous training on nustep BUE/BLE workload 5 for 10 minutes, provided with mirror in front for pt to self correct R lateral lean during exercise as pt demonstrating excessive R lateral lean.  Pt completes transfer back to WC to L, increased difficulty with sequencing for transfer to L. Pt completes forward/backward walking with RW with min assist and verbal cues for positioning with RW as well as sequencing, completed to promote improved RW mgmt with ambulation.  Pt returns to room and remains seated in [facility] with all needs within reach, cal light in place and chair alarm donned and activated at end of session.   Therapy Documentation Precautions:  Precautions Precautions: Fall Restrictions Weight Bearing Restrictions: No   Therapy/Group: Individual Therapy  Edwin Cap PT,  DPT 06/27/2023, 12:56 PM

## 2023-06-28 ENCOUNTER — Ambulatory Visit: Payer: Medicare Other | Admitting: Physician Assistant

## 2023-06-28 ENCOUNTER — Ambulatory Visit: Payer: Medicare Other

## 2023-06-28 ENCOUNTER — Inpatient Hospital Stay (HOSPITAL_COMMUNITY): Payer: Medicare Other

## 2023-06-28 ENCOUNTER — Other Ambulatory Visit: Payer: Medicare Other

## 2023-06-28 LAB — COMPREHENSIVE METABOLIC PANEL
ALT: 16 U/L (ref 0–44)
AST: 16 U/L (ref 15–41)
Albumin: 2.5 g/dL — ABNORMAL LOW (ref 3.5–5.0)
Alkaline Phosphatase: 92 U/L (ref 38–126)
Anion gap: 10 (ref 5–15)
BUN: 37 mg/dL — ABNORMAL HIGH (ref 8–23)
CO2: 25 mmol/L (ref 22–32)
Calcium: 9.1 mg/dL (ref 8.9–10.3)
Chloride: 108 mmol/L (ref 98–111)
Creatinine, Ser: 1.44 mg/dL — ABNORMAL HIGH (ref 0.44–1.00)
GFR, Estimated: 40 mL/min — ABNORMAL LOW (ref >60)
Glucose, Bld: 156 mg/dL — ABNORMAL HIGH (ref 70–99)
Potassium: 4.2 mmol/L (ref 3.5–5.1)
Sodium: 143 mmol/L (ref 135–145)
Total Bilirubin: 0.5 mg/dL (ref 0.3–1.2)
Total Protein: 5.6 g/dL — ABNORMAL LOW (ref 6.5–8.1)

## 2023-06-28 LAB — GLUCOSE, CAPILLARY
Glucose-Capillary: 98 mg/dL (ref 70–99)
Glucose-Capillary: 84 mg/dL (ref 70–99)
Glucose-Capillary: 173 mg/dL — ABNORMAL HIGH (ref 70–99)
Glucose-Capillary: 108 mg/dL — ABNORMAL HIGH (ref 70–99)

## 2023-06-28 LAB — MAGNESIUM: Magnesium: 2 mg/dL (ref 1.7–2.4)

## 2023-06-28 MED ORDER — SPIRONOLACTONE 25 MG PO TABS
25.0000 mg | ORAL_TABLET | Freq: Every day | ORAL | Status: DC
  Administered 2023-06-29 – 2023-07-02 (×4): 25 mg via ORAL
  Filled 2023-06-28 (×4): qty 1

## 2023-06-28 MED ORDER — FUROSEMIDE 10 MG/ML IJ SOLN
20.0000 mg | Freq: Once | INTRAMUSCULAR | Status: AC
  Administered 2023-06-28: 20 mg via INTRAVENOUS
  Filled 2023-06-28: qty 2

## 2023-06-28 NOTE — Plan of Care (Signed)
  Problem: RH BOWEL ELIMINATION Goal: RH STG MANAGE BOWEL WITH ASSISTANCE Description: STG Manage Bowel with toileting Assistance. Outcome: Progressing Goal: RH STG MANAGE BOWEL W/MEDICATION W/ASSISTANCE Description: STG Manage Bowel with Medication with mod I Assistance. Outcome: Progressing   Problem: RH BLADDER ELIMINATION Goal: RH STG MANAGE BLADDER WITH ASSISTANCE Description: STG Manage Bladder With toileting Assistance Outcome: Progressing Goal: RH STG MANAGE BLADDER WITH MEDICATION WITH ASSISTANCE Description: STG Manage Bladder With Medication With mod I Assistance. Outcome: Progressing   Problem: RH SKIN INTEGRITY Goal: RH STG SKIN FREE OF INFECTION/BREAKDOWN Description: Manage w min assist Outcome: Progressing Goal: RH STG MAINTAIN SKIN INTEGRITY WITH ASSISTANCE Description: STG Maintain Skin Integrity With min Assistance. Outcome: Progressing   Problem: RH SAFETY Goal: RH STG ADHERE TO SAFETY PRECAUTIONS W/ASSISTANCE/DEVICE Description: STG Adhere to Safety Precautions With cues Assistance/Device. Outcome: Progressing   Problem: RH PAIN MANAGEMENT Goal: RH STG PAIN MANAGED AT OR BELOW PT'S PAIN GOAL Description: < 4 with prns Outcome: Progressing   Problem: RH KNOWLEDGE DEFICIT GENERAL Goal: RH STG INCREASE KNOWLEDGE OF SELF CARE AFTER HOSPITALIZATION Description: Patient and spouse will be able to manage care at discharge with medications and dietary modification using educational resources independently Outcome: Progressing

## 2023-06-28 NOTE — Progress Notes (Addendum)
PROGRESS NOTE   Subjective/Complaints: No acute complaints. No events overnight. Vitals stable. Cr increased on AM labs; Albumin mildly imprived 2.1->2.4. I/Os appear poor intakes 240 cc yesterday.   On exam, patient states that she is not feeling well today.  Is very tired and dizzy, has not eaten much, feeling very tired.  Also feels that the swelling in her legs has gotten worse.  ** CXR with RLL atelectasis vs. Pneumonia. No objective fevers, SOB, cough, etc.; lasix for volume as below, labs in AM; low threshold to tx for HAP if any concerning s/s**  ROS: New generalized illness feeling, ongoing dizziness, lethargy; swelling-ongoing Pertinent positives per HPI above.  Patient denies fever, rash, sore throat, blurred vision,  nausea, vomiting, diarrhea, cough, shortness of breath or chest pain, joint or back/neck pain, headache, or mood change.     Objective:   No results found. No results for input(s): "WBC", "HGB", "HCT", "PLT" in the last 72 hours.    Recent Labs    06/26/23 0618 06/28/23 0756  NA 140 143  K 4.4 4.2  CL 109 108  CO2 24 25  GLUCOSE 103* 156*  BUN 34* 37*  CREATININE 1.13* 1.44*  CALCIUM 8.1* 9.1     Intake/Output Summary (Last 24 hours) at 06/28/2023 1031 Last data filed at 06/28/2023 0951 Gross per 24 hour  Intake 720 ml  Output --  Net 720 ml     Pressure Injury 06/03/23 Buttocks Right Deep Tissue Pressure Injury - Purple or maroon localized area of discolored intact skin or blood-filled blister due to damage of underlying soft tissue from pressure and/or shear. purple (Active)  06/03/23 1740  Location: Buttocks  Location Orientation: Right  Staging: Deep Tissue Pressure Injury - Purple or maroon localized area of discolored intact skin or blood-filled blister due to damage of underlying soft tissue from pressure and/or shear.  Wound Description (Comments): purple  Present on Admission:  Yes     Pressure Injury 06/03/23 Buttocks Left Deep Tissue Pressure Injury - Purple or maroon localized area of discolored intact skin or blood-filled blister due to damage of underlying soft tissue from pressure and/or shear. (Active)  06/03/23   Location: Buttocks  Location Orientation: Left  Staging: Deep Tissue Pressure Injury - Purple or maroon localized area of discolored intact skin or blood-filled blister due to damage of underlying soft tissue from pressure and/or shear.  Wound Description (Comments):   Present on Admission: Yes    Physical Exam: Vital Signs Blood pressure 139/88, pulse 90, temperature 97.6 F (36.4 C), temperature source Oral, resp. rate 18, height 5\' 2"  (1.575 m), weight 75.4 kg, SpO2 100%.  Constitutional: No distress . Vital signs reviewed. Sitting in bed, tired. Eyes: Horizontal nystagmus with gaze in all directions -no longer present at rest but ongoing with EOMI. does appear stable from prior exams.  PERRLA Cardiovascular: RRR mild systolic murmur. No JVD  . Edema 2+ at hips, +1 in ankles.  Respiratory/Chest: Mild bibasilar crackles, worse on left than right.  Comfortable on room air. GI/Abdomen: BS +, non-tender, non-distended Ext: no clubbing, cyanosis, deformity Psych: pleasant and cooperative, lethargic Skin: C/D/I. No apparent lesions.  Bilateral buttocks DTI  as below-not examined today          Neurologic exam: alert and attentive Cognition: AAO to person, place, time and event.  + Mild cognitive, memory deficits -mild delayed recall -somewhat worse today + Mild dysarthria - stable, unchanged although husband feels does it is worse today Insight: Good insight into current condition.  Strength: Moving all 4 extremities to gravity and resistance in bedside chair, 4+ out of 5 left upper extremity, 5 out of 5 all others LUE ataxia -ongoing, unchanged from prior exams  Assessment/Plan: 1. Functional deficits which require 3+ hours per day of  interdisciplinary therapy in a comprehensive inpatient rehab setting. Physiatrist is providing close team supervision and 24 hour management of active medical problems listed below. Physiatrist and rehab team continue to assess barriers to discharge/monitor patient progress toward functional and medical goals  Care Tool:  Bathing    Body parts bathed by patient: Right arm, Left arm, Chest, Abdomen, Right upper leg, Left upper leg, Face, Front perineal area   Body parts bathed by helper: Left lower leg, Right lower leg, Buttocks, Front perineal area     Bathing assist Assist Level: Moderate Assistance - Patient 50 - 74%     Upper Body Dressing/Undressing Upper body dressing   What is the patient wearing?: Pull over shirt    Upper body assist Assist Level: Contact Guard/Touching assist    Lower Body Dressing/Undressing Lower body dressing      What is the patient wearing?: Underwear/pull up, Pants     Lower body assist Assist for lower body dressing: Moderate Assistance - Patient 50 - 74%     Toileting Toileting    Toileting assist Assist for toileting: Moderate Assistance - Patient 50 - 74%     Transfers Chair/bed transfer  Transfers assist  Chair/bed transfer activity did not occur: Safety/medical concerns  Chair/bed transfer assist level: Contact Guard/Touching assist     Locomotion Ambulation   Ambulation assist      Assist level: Minimal Assistance - Patient > 75% Assistive device: Walker-rolling Max distance: 79'   Walk 10 feet activity   Assist  Walk 10 feet activity did not occur: Safety/medical concerns  Assist level: Contact Guard/Touching assist Assistive device: Walker-rolling   Walk 50 feet activity   Assist Walk 50 feet with 2 turns activity did not occur: Safety/medical concerns  Assist level: Minimal Assistance - Patient > 75% Assistive device: Walker-rolling    Walk 150 feet activity   Assist Walk 150 feet activity did not  occur: Safety/medical concerns         Walk 10 feet on uneven surface  activity   Assist Walk 10 feet on uneven surfaces activity did not occur: Safety/medical concerns         Wheelchair     Assist Is the patient using a wheelchair?: Yes Type of Wheelchair: Manual    Wheelchair assist level: Dependent - Patient 0%      Wheelchair 50 feet with 2 turns activity    Assist        Assist Level: Dependent - Patient 0%   Wheelchair 150 feet activity     Assist      Assist Level: Dependent - Patient 0%   Blood pressure 139/88, pulse 90, temperature 97.6 F (36.4 C), temperature source Oral, resp. rate 18, height 5\' 2"  (1.575 m), weight 75.4 kg, SpO2 100%.   Medical Problem List and Plan: 1. Functional deficits secondary to debility after cardiac arrest and multiple related  complications             -patient may shower             -ELOS/Goals: 12-14 days, min assist with PT, OT (now CGA/SPV goals), SLP - discharge date 10/22             -pt has episodes of fatigue/somnolence at baseline per husband. He says she does better in the later morning and early afternoon.   -Continue CIR therapies including PT, OT, and SLP    - 10/15: walking up to 68 feet; working on cognitive engagement; 13 step goal per family not realistic, has 1st floor setup. D/w family purchasing Roho to prevent skin breakdown.    - 10/10: Reached out to palliative, requesting they touch base with patient and family again prior to discharge 10-22 regarding lingering questions on caregiver role and end-of-life  10/12: Worsening overall dizziness/vertigo and ataxia, progressive over last 2 days; patient opts to wait 1 more day for repeat imaging to evaluate -self reports symptoms improved 10-13; ongoing   2.  Antithrombotics: -DVT/anticoagulation:  Mechanical:  Antiembolism stockings, knee (TED hose) Bilateral lower extremities (age indeterminate left posterior tib DVT). No anticoagulation  given that this is a known DVT from 3/21 and that she's had a cerebral hemorrhage while on eliquis for that same clot the same year.             -antiplatelet therapy: none  -10-17: Reinforced use of TED hose when out of bed for increasing edema   3. Pain Management: Tylenol, Norco, oxycodone as needed -Lidoderm patch             -Oxycontin 10 mg q 12 hours             -observe for further sedation. Was somnolent this morning after receiving pain medication.   -10-3: Continue OxyContin 10 mg every 12 hours (is on 13.5 mg every 12 at home) just short acting to home regimen oxycodone 5 mg every 4 hours as needed     - 10/8: Add PRN methocarbamol for muscle soreness -  not used    10/5-6 pain appears controlled.  4. Mood/Behavior/Sleep: LCSW to evaluate and provide emotional support             -antipsychotic agents: Zyprexa 2.5 mg q HS-at home dose             -Xanax prn -at home dose  -10/14 as needed melatonin 3 mg - improved   5. Neuropsych/cognition: This patient is capable of making decisions on his own behalf.   6. Skin/Wound Care: Routine skin care checks   -10-1: Family inquiring about protein supplementation, will look into supplements given on inpatient.   7. Fluids/Electrolytes/Nutrition: Routine Is and Os and follow-up chemistries   -10-1: See below, potassium borderline, magnesium repleted today.  Rechecking in AM.   - 10-2: Electrolytes repleted, labs normal, monitor  - 10/4: Encourage p.o. fluids due to tachycardia today; BMP in a.m.  -10/6 Potassium 3.2--increase supplement  -BUN/Cr holding fairly steady--continue to push fluids -10-7: Potassium 3.8.  Continue current supplement.  BUN/creatinine stable.  - labs in AM for BMP, Mag given soreness -stable 1/9; AKI 10-12; repeat Monday -10/14 Potassium stable at 4.2 -10-17: CMP, magnesium in a.m.; concerned with albumin, anasarca contributing to poor edema management 10-18: See volume overload treatment below.  AKI today,  may improve with diuresis, encourage patient that she still needs to drink some fluid even with diuresis and repeat  BMP, mag in AM.    8: Hypertension: monitor TID and prn (home Norvasc held)   - Remains normotensive, monitor with addition of spironolactone/ BMP  - change to 10/11 given symptoms   - 10/12: BMP with AKI; increase in BP may be from steroids vs. Contracting; reduce spirinolactone to 12.5 mg daily and repeat labs Monday  10-17: Tolerated dose of Toprol.   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  Will monitor for any functional decline, and may reach out to neuro-oncology for prolonged taper depending on symptom severity.  10/10: Finished Decadron taper, discussed with family notifying provider if any worsening symptoms of dizziness, ataxia, or lethargy with this.  If needed to resume, will reach out to Dr. Barbaraann Cao --> resumed 1 mg daily until OP f/u   10: Bilateral rib fractures due to chest compressions -pain control as above   11: V-fib cardiac arrest:  -continue Pacerone 400 mg daily for one week, then 200 mg daily on 10/08--Dropped off 10/15 ? transition, resumed 10/15, will touch base with cardiology regarding plans for transition to BB -> start toprol xl 25 mg 10/17 AM - tolerating              -not a candidate for invasive intervention             -K > 4; Mg > 2 -potassium 3.8 on admission labs, recheck in AM.  Magnesium 1.6, replete 2 g IV today and then start 400 mg twice daily due to recurrent drops.  Recheck in AM.             -if patient needs diuretics outpatient due  to lower extremity edema then cardiology will consider changing to a potassium sparing diuretic such as spironolactone 25 mg daily along with some potassium supplementation and magnesium             -follow-up with Dr. Lynnette Caffey 3 weeks after D/C from CIR    - Daily weights   -10/6 supplement K+  -10-9: Reminding nursing to perform daily weights.will have cards eval today to initiate diuretic and adjust meds given ongoing swelling and mild hypotension  10/10: Cardiology resumed spironolactone 25 mg daily due to peripheral edema, appears to be helping.     10/12: reduce to 12.5 mg daily d/t AKI  10/15: D/w nursing re: resuming daily weights. May need to increase diuretics again d/t swelling; monitorring closely.   10-17: Weights remain downtrending/stable.,  Increased peripheral edema on exam.  Unsure if due to poor nutritional status or truly volume overloaded.  CMP in AM.  Encouraged TED hose.  10/18: Up 5 kg today? Cr increased; gross signs of volume overload on exam.  Chest x-ray read pending, but showing increased central vascular prominence.  No apparent effusions.  20 mg IV Lasix today, then increase spironolactone to 25 mg daily starting tomorrow a.m.  Per cardiology, may need loop diuretic added as outpatient; would have low  threshold to consult them for medication adjustments if no improvement  Filed Weights   06/25/23 1700 06/27/23 0600 06/28/23 0623  Weight: 71 kg 70.4 kg 75.4 kg     12: Stage IV lung cancer/mets to brain:             -continue Keppra 1000 mg BID             -follow-up with Dr. 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 fluids, labs stable, f/u monday  10-7: Creatinine stabilizing at 1.1-1.2; unchange d10-9;  -10-18: Creatinine increasing, 1.4 today, with gross signs of volume overload.  May be cardiorenal, repeat labs in a.m. and encourage patient to drink some fluid today as intakes have been minimal.  17: Anemia: ~3 gram drop in hemoglobin/no overt bleeding             -follow-up CBC. Most recently has been ~7.7; stable on admission labs             -check Hemoccult X 3 -pending             -has hemorrhoids, constipated             -continue PPI, Pepcid   - hgb stable at 7.8 on 10/5; greater than 8 10/7  -10/14 Hgb stable at 8.3   18: DM: A1c = 7.3%, also on Decadron             -start CBGs QID and SSI             -carb modified diet as tolerated  Blood sugars well-controlled, monitor  -10-3: Mild elevation blood sugars over the last few days, likely with improved diet; not on any diabetic regimen as outpatient.  Monitor 1 day and consider initiating low-dose metformin if no improvement.   - 10-4: Patient working on dietary adjustments, mildly improved.  Continue to monitor Recent Labs    06/27/23 1634 06/27/23 2112 06/28/23 0624  GLUCAP 117* 273* 98    10/6 sugars have been elevated due to steroids--decadron down to 1mg  daily and readings improved except for readings around dinner time   -continue current cbg checks and SSI for now   -may be able to stop these soon  10/14-17 CBGs well-controlled continue current regimen; elevated d/t steroids    19: Prior post fossa craniotomy for tumor resection 12/02/2020: resection of progressive cerebellar mass by Dr. Venetia Maxon. 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 Has chronic intermittent dizziness>>meclizine prn-is  well-controlled   - well controlled, no current complaints  -10/5 scheduled meclizine 25mg  tid per pt request -> increase to 50 mg TID 10/11; this was OP dose  - Had prior rxn to scopalamine  - 10/11: Symptoms worsening s/p  steroid taper, messaged Dr. Barbaraann Cao regarding resuming 1 mg daily until OP follow up --> he is in agreement with this, resumed 10/11   - 10/12: Symptoms worsening overall; see above, if continuing tomorrow will re-image    - 10/13: Improved, defer further imaging to neuro-oncology/oncology OP   20: External hemorrhoids: continue topical hydrocortisone 2.5%; reordered today, as suppository was prior ordered.-Was well-controlled   21: Intermittent constipation: check for impaction then decide on appropriate regimen-resolved  10-1: Multiple small bowel movements overnight, add MiraLAX 17 mg daily  10-2: Small brown bowel movement this a.m., mushy consistency - improved  Last bowel movement 10/4--hopeful for BM today 10/6  Small bowel movement 10-7, increase MiraLAX to twice daily and add as needed Senokot per family request  10-16 last BM       LOS: 18 days A FACE TO FACE EVALUATION WAS PERFORMED  Angelina Sheriff 06/28/2023, 10:31 AM

## 2023-06-28 NOTE — Progress Notes (Signed)
Occupational Therapy Note  Patient Details  Name: Virginia [name] MRN: [id number] Date of Birth: 12-Mar-1956  Today's Date: 06/28/2023 OT Missed Time: 60 Minutes Missed Time Reason: Other (comment) (pt declined group session)  Pt declined group stating she was having a hard time today and declined participation in therapeutic activity group.    Pollyann Glen [facility] 06/28/2023, 3:17 PM

## 2023-06-28 NOTE — Progress Notes (Signed)
Physical Therapy Session Note  Patient Details  Name: Virginia [name] MRN: [id number] Date of Birth: 1956/08/24  Today's Date: 06/28/2023 PT Missed Time: 75 Minutes Missed Time Reason: MD hold (Comment);Patient fatigue (Per MD, pt having a bad day and therapy on hold.)  Short Term Goals: Week 3:  PT Short Term Goal 1 (Week 3): STG = LTG due to ELOS  Skilled Therapeutic Interventions/Progress Updates:     Pt's husband states that pt is having a "bad day" and that MD encouraged her to take day off from therapies. PT will follow up as able.   Therapy Documentation Precautions:  Precautions Precautions: Fall Restrictions Weight Bearing Restrictions: No General: PT Amount of Missed Time (min): 75 Minutes PT Missed Treatment Reason: MD hold (Comment);Patient fatigue (Per MD, pt having a bad day and therapy on hold.)   Therapy/Group: Individual Therapy  Beau [name], PT, DPT 06/28/2023, 3:53 PM

## 2023-06-28 NOTE — Progress Notes (Signed)
Occupational Therapy Session Note  Patient Details  Name: Virginia [name] MRN: [id number] Date of Birth: 1955/12/13  Today's Date: 06/28/2023 OT Individual Time: 4098-1191 OT Individual Time Calculation (min): 60 min    Short Term Goals: Week 3:  OT Short Term Goal 1 (Week 3): LTG=STG 2/2 ELOS  Skilled Therapeutic Interventions/Progress Updates:    Pt greeted semi-reclined in bed and agreeable to OT treatment session. Pt reported she was feeling very tired today and her back was hurting, but agreeable to participate. Pt completed bed mobility with increased time and HOB elevated. Pt ambulated 10 feet w/ RW very slow with cues to keep her head up and take smaller steps as steps were very shuffled. Pt reported feeling dizzy and fatigued. Pt brought the rest of the way to therapy gym in wc. Stand-pivot to therapy mat with  RW and CGA. Worked on gaze stabilization using a card on a board. This helped some with dizziness. Addressed standing balance/endurance and FMC with standing card matching activity.Guided A with L UE and some increases tremors on the L side. Pt needed extended time and multiple rest breaks to place 9 cards. Addressed functional ambulation and endurance walking w/ RW. Pt still very fatigued and only able to amublated 10-12 feet at a timex3 with rest breaks. Pt reported continued back pain and OT placed heating pack on back and pt left seated in wc with spouse present and needs met.  Therapy Documentation Precautions:  Precautions Precautions: Fall Restrictions Weight Bearing Restrictions: No Pain:  Pt reports increased back pain. Rest, reposiitoned, and heat applied.    Therapy/Group: Individual Therapy  Mal Amabile 06/28/2023, 9:24 AM

## 2023-06-28 NOTE — Progress Notes (Signed)
Recreational Therapy Session Note  Patient Details  Name: Virginia [name] MRN: [id number] Date of Birth: [birth year] Today's Date: 06/28/2023  Pt upset/tearful upon entry to the room.  Husband at bedside.  Pt stating she was having a hard time today and declined participation in therapeutic activity group.  Emotional support provided.  Rease Swinson 06/28/2023, 12:29 PM

## 2023-06-28 NOTE — Progress Notes (Signed)
Patient ID: Virginia [name], female   DOB: 26-May-1956, 67 y.o.   MRN: [id number]  SW sent [facility] resumption of care orders/new orders for HHPT/OT/SLP/aide to [facility].    Cecile Sheerer, MSW, LCSWA Office: [phone] Cell: [phone] Fax: [phone]

## 2023-06-29 LAB — BASIC METABOLIC PANEL
Anion gap: 9 (ref 5–15)
BUN: 37 mg/dL — ABNORMAL HIGH (ref 8–23)
CO2: 24 mmol/L (ref 22–32)
Calcium: 8.4 mg/dL — ABNORMAL LOW (ref 8.9–10.3)
Chloride: 107 mmol/L (ref 98–111)
Creatinine, Ser: 1.32 mg/dL — ABNORMAL HIGH (ref 0.44–1.00)
GFR, Estimated: 45 mL/min — ABNORMAL LOW (ref >60)
Glucose, Bld: 102 mg/dL — ABNORMAL HIGH (ref 70–99)
Potassium: 3.7 mmol/L (ref 3.5–5.1)
Sodium: 140 mmol/L (ref 135–145)

## 2023-06-29 LAB — CBC WITH DIFFERENTIAL/PLATELET
Abs Immature Granulocytes: 0.1 10*3/uL — ABNORMAL HIGH (ref 0.00–0.07)
Basophils Absolute: 0 10*3/uL (ref 0.0–0.1)
Basophils Relative: 1 %
Eosinophils Absolute: 0.1 10*3/uL (ref 0.0–0.5)
Eosinophils Relative: 2 %
HCT: 25.7 % — ABNORMAL LOW (ref 36.0–46.0)
Hemoglobin: 8.5 g/dL — ABNORMAL LOW (ref 12.0–15.0)
Immature Granulocytes: 1 %
Lymphocytes Relative: 36 %
Lymphs Abs: 3.2 10*3/uL (ref 0.7–4.0)
MCH: 35 pg — ABNORMAL HIGH (ref 26.0–34.0)
MCHC: 33.1 g/dL (ref 30.0–36.0)
MCV: 105.8 fL — ABNORMAL HIGH (ref 80.0–100.0)
Monocytes Absolute: 0.8 10*3/uL (ref 0.1–1.0)
Monocytes Relative: 9 %
Neutro Abs: 4.6 10*3/uL (ref 1.7–7.7)
Neutrophils Relative %: 51 %
Platelets: 243 10*3/uL (ref 150–400)
RBC: 2.43 MIL/uL — ABNORMAL LOW (ref 3.87–5.11)
RDW: 18.6 % — ABNORMAL HIGH (ref 11.5–15.5)
WBC: 8.9 10*3/uL (ref 4.0–10.5)
nRBC: 0 % (ref 0.0–0.2)

## 2023-06-29 LAB — GLUCOSE, CAPILLARY
Glucose-Capillary: 99 mg/dL (ref 70–99)
Glucose-Capillary: 114 mg/dL — ABNORMAL HIGH (ref 70–99)
Glucose-Capillary: 156 mg/dL — ABNORMAL HIGH (ref 70–99)
Glucose-Capillary: 184 mg/dL — ABNORMAL HIGH (ref 70–99)

## 2023-06-29 LAB — MAGNESIUM: Magnesium: 1.9 mg/dL (ref 1.7–2.4)

## 2023-06-29 NOTE — Plan of Care (Signed)
  Problem: RH BLADDER ELIMINATION Goal: RH STG MANAGE BLADDER WITH ASSISTANCE Description: STG Manage Bladder With toileting Assistance Outcome: Progressing Goal: RH STG MANAGE BLADDER WITH MEDICATION WITH ASSISTANCE Description: STG Manage Bladder With Medication With mod I Assistance. Outcome: Progressing   Problem: RH SKIN INTEGRITY Goal: RH STG SKIN FREE OF INFECTION/BREAKDOWN Description: Manage w min assist Outcome: Progressing   Problem: RH SAFETY Goal: RH STG ADHERE TO SAFETY PRECAUTIONS W/ASSISTANCE/DEVICE Description: STG Adhere to Safety Precautions With cues Assistance/Device. Outcome: Progressing

## 2023-06-29 NOTE — Plan of Care (Signed)
  Problem: RH BOWEL ELIMINATION Goal: RH STG MANAGE BOWEL WITH ASSISTANCE Description: STG Manage Bowel with toileting Assistance. Outcome: Progressing Goal: RH STG MANAGE BOWEL W/MEDICATION W/ASSISTANCE Description: STG Manage Bowel with Medication with mod I Assistance. Outcome: Progressing   Problem: RH BLADDER ELIMINATION Goal: RH STG MANAGE BLADDER WITH ASSISTANCE Description: STG Manage Bladder With toileting Assistance Outcome: Progressing Goal: RH STG MANAGE BLADDER WITH MEDICATION WITH ASSISTANCE Description: STG Manage Bladder With Medication With mod I Assistance. Outcome: Progressing   Problem: RH SKIN INTEGRITY Goal: RH STG SKIN FREE OF INFECTION/BREAKDOWN Description: Manage w min assist Outcome: Progressing Goal: RH STG MAINTAIN SKIN INTEGRITY WITH ASSISTANCE Description: STG Maintain Skin Integrity With min Assistance. Outcome: Progressing   Problem: RH SAFETY Goal: RH STG ADHERE TO SAFETY PRECAUTIONS W/ASSISTANCE/DEVICE Description: STG Adhere to Safety Precautions With cues Assistance/Device. Outcome: Progressing   Problem: RH PAIN MANAGEMENT Goal: RH STG PAIN MANAGED AT OR BELOW PT'S PAIN GOAL Description: < 4 with prns Outcome: Progressing   Problem: RH KNOWLEDGE DEFICIT GENERAL Goal: RH STG INCREASE KNOWLEDGE OF SELF CARE AFTER HOSPITALIZATION Description: Patient and spouse will be able to manage care at discharge with medications and dietary modification using educational resources independently Outcome: Progressing

## 2023-06-29 NOTE — Progress Notes (Signed)
PROGRESS NOTE   Subjective/Complaints: Patient reports she is feeling better today overall, less tired than she did yesterday.  Indicates mood was decreased yesterday.  ROS: New generalized illness feeling, ongoing dizziness, lethargy; swelling-improved today Pertinent positives per HPI above.  Patient denies fever, rash, sore throat, blurred vision,  nausea, vomiting, diarrhea, cough, shortness of breath or chest pain, joint or back/neck pain, headache, or mood change.     Objective:   DG CHEST PORT 1 VIEW  Result Date: 06/28/2023 CLINICAL DATA:  Volume overload. EXAM: PORTABLE CHEST 1 VIEW COMPARISON:  June 03, 2023. FINDINGS: Stable cardiomegaly. Endotracheal and nasogastric tubes have been removed. Status post kyphoplasty of midthoracic vertebral body. Left lung is clear. Mild right basilar atelectasis or infiltrate is noted. IMPRESSION: Mild right basilar atelectasis or pneumonia is noted. Followup PA and lateral chest X-ray is recommended in 3-4 weeks following trial of antibiotic therapy to ensure resolution and exclude underlying malignancy. Electronically Signed   By: Lupita Raider M.D.   On: 06/28/2023 14:24   Recent Labs    06/29/23 0816  WBC 8.9  HGB 8.5*  HCT 25.7*  PLT 243      Recent Labs    06/28/23 0756 06/29/23 0816  NA 143 140  K 4.2 3.7  CL 108 107  CO2 25 24  GLUCOSE 156* 102*  BUN 37* 37*  CREATININE 1.44* 1.32*  CALCIUM 9.1 8.4*     Intake/Output Summary (Last 24 hours) at 06/29/2023 1847 Last data filed at 06/29/2023 0825 Gross per 24 hour  Intake 840 ml  Output 2 ml  Net 838 ml     Pressure Injury 06/03/23 Buttocks Right Deep Tissue Pressure Injury - Purple or maroon localized area of discolored intact skin or blood-filled blister due to damage of underlying soft tissue from pressure and/or shear. purple (Active)  06/03/23 1740  Location: Buttocks  Location Orientation: Right   Staging: Deep Tissue Pressure Injury - Purple or maroon localized area of discolored intact skin or blood-filled blister due to damage of underlying soft tissue from pressure and/or shear.  Wound Description (Comments): purple  Present on Admission: Yes     Pressure Injury 06/03/23 Buttocks Left Deep Tissue Pressure Injury - Purple or maroon localized area of discolored intact skin or blood-filled blister due to damage of underlying soft tissue from pressure and/or shear. (Active)  06/03/23   Location: Buttocks  Location Orientation: Left  Staging: Deep Tissue Pressure Injury - Purple or maroon localized area of discolored intact skin or blood-filled blister due to damage of underlying soft tissue from pressure and/or shear.  Wound Description (Comments):   Present on Admission: Yes    Physical Exam: Vital Signs Blood pressure 130/77, pulse 77, temperature 99 F (37.2 C), resp. rate 17, height 5\' 2"  (1.575 m), weight 75.4 kg, SpO2 98%.  Constitutional: No distress . Vital signs reviewed.  Sitting in bed Eyes: Horizontal nystagmus with gaze in all directions -no longer present at rest but ongoing with EOMI. does appear stable from prior exams.  PERRLA Cardiovascular: RRR mild systolic murmur. No JVD  . Edema 2+ at hips, +1 in ankles.  Respiratory/Chest: Mild bibasilar crackles, worse on  left than right.  Nonlabored breathing on room air GI/Abdomen: BS +, non-tender, non-distended Ext: no clubbing, cyanosis, deformity Psych: pleasant and cooperative Skin: C/D/I. No apparent lesions.  Bilateral buttocks DTI as below-not examined today          Neurologic exam: alert and attentive Cognition: AAO to person, place, time and event.  + Mild cognitive, memory deficits -mild delayed recall + Mild dysarthria -continues Insight: Good insight into current condition.  Strength: Moving all 4 extremities to gravity and resistance in bedside chair, 4+ out of 5 left upper extremity, 5 out of 5 all  others LUE ataxia -ongoing, unchanged from prior exams  Assessment/Plan: 1. Functional deficits which require 3+ hours per day of interdisciplinary therapy in a comprehensive inpatient rehab setting. Physiatrist is providing close team supervision and 24 hour management of active medical problems listed below. Physiatrist and rehab team continue to assess barriers to discharge/monitor patient progress toward functional and medical goals  Care Tool:  Bathing    Body parts bathed by patient: Right arm, Left arm, Chest, Abdomen, Right upper leg, Left upper leg, Face, Front perineal area   Body parts bathed by helper: Left lower leg, Right lower leg, Buttocks, Front perineal area     Bathing assist Assist Level: Moderate Assistance - Patient 50 - 74%     Upper Body Dressing/Undressing Upper body dressing   What is the patient wearing?: Pull over shirt    Upper body assist Assist Level: Contact Guard/Touching assist    Lower Body Dressing/Undressing Lower body dressing      What is the patient wearing?: Underwear/pull up, Pants     Lower body assist Assist for lower body dressing: Moderate Assistance - Patient 50 - 74%     Toileting Toileting    Toileting assist Assist for toileting: Moderate Assistance - Patient 50 - 74%     Transfers Chair/bed transfer  Transfers assist  Chair/bed transfer activity did not occur: Safety/medical concerns  Chair/bed transfer assist level: Contact Guard/Touching assist     Locomotion Ambulation   Ambulation assist      Assist level: Minimal Assistance - Patient > 75% Assistive device: Walker-rolling Max distance: 13'   Walk 10 feet activity   Assist  Walk 10 feet activity did not occur: Safety/medical concerns  Assist level: Contact Guard/Touching assist Assistive device: Walker-rolling   Walk 50 feet activity   Assist Walk 50 feet with 2 turns activity did not occur: Safety/medical concerns  Assist level:  Minimal Assistance - Patient > 75% Assistive device: Walker-rolling    Walk 150 feet activity   Assist Walk 150 feet activity did not occur: Safety/medical concerns         Walk 10 feet on uneven surface  activity   Assist Walk 10 feet on uneven surfaces activity did not occur: Safety/medical concerns         Wheelchair     Assist Is the patient using a wheelchair?: Yes Type of Wheelchair: Manual    Wheelchair assist level: Dependent - Patient 0%      Wheelchair 50 feet with 2 turns activity    Assist        Assist Level: Dependent - Patient 0%   Wheelchair 150 feet activity     Assist      Assist Level: Dependent - Patient 0%   Blood pressure 130/77, pulse 77, temperature 99 F (37.2 C), resp. rate 17, height 5\' 2"  (1.575 m), weight 75.4 kg, SpO2 98%.   Medical  Problem List and Plan: 1. Functional deficits secondary to debility after cardiac arrest and multiple related complications             -patient may shower             -ELOS/Goals: 12-14 days, min assist with PT, OT (now CGA/SPV goals), SLP - discharge date 10/22             -pt has episodes of fatigue/somnolence at baseline per husband. He says she does better in the later morning and early afternoon.   -Continue CIR therapies including PT, OT, and SLP    - 10/15: walking up to 68 feet; working on cognitive engagement; 13 step goal per family not realistic, has 1st floor setup. D/w family purchasing Roho to prevent skin breakdown.    - 10/10: Reached out to palliative, requesting they touch base with patient and family again prior to discharge 10-22 regarding lingering questions on caregiver role and end-of-life  10/12: Worsening overall dizziness/vertigo and ataxia, progressive over last 2 days; patient opts to wait 1 more day for repeat imaging to evaluate -self reports symptoms improved 10-13; ongoing    2.  Antithrombotics: -DVT/anticoagulation:  Mechanical:  Antiembolism  stockings, knee (TED hose) Bilateral lower extremities (age indeterminate left posterior tib DVT). 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             -antiplatelet therapy: none  -10-17: Reinforced use of TED hose when out of bed for increasing edema   3. Pain Management: Tylenol, Norco, oxycodone as needed -Lidoderm patch             -Oxycontin 10 mg q 12 hours             -observe for further sedation. Was somnolent this morning after receiving pain medication.   -10-3: Continue OxyContin 10 mg every 12 hours (is on 13.5 mg every 12 at home) just short acting to home regimen oxycodone 5 mg every 4 hours as needed     - 10/8: Add PRN methocarbamol for muscle soreness -  not used    10/5-6 pain appears controlled.  4. Mood/Behavior/Sleep: LCSW to evaluate and provide emotional support             -antipsychotic agents: Zyprexa 2.5 mg q HS-at home dose             -Xanax prn -at home dose  -10/14 as needed melatonin 3 mg - improved   5. Neuropsych/cognition: This patient is capable of making decisions on his own behalf.   6. Skin/Wound Care: Routine skin care checks   -10-1: Family inquiring about protein supplementation, will look into supplements given on inpatient.   7. Fluids/Electrolytes/Nutrition: Routine Is and Os and follow-up chemistries   -10-1: See below, potassium borderline, magnesium repleted today.  Rechecking in AM.   - 10-2: Electrolytes repleted, labs normal, monitor  - 10/4: Encourage p.o. fluids due to tachycardia today; BMP in a.m.  -10/6 Potassium 3.2--increase supplement  -BUN/Cr holding fairly steady--continue to push fluids -10-7: Potassium 3.8.  Continue current supplement.  BUN/creatinine stable.  - labs in AM for BMP, Mag given soreness -stable 1/9; AKI 10-12; repeat Monday -10/14 Potassium stable at 4.2 -10-17: CMP, magnesium in a.m.; concerned  with albumin, anasarca contributing to poor edema management 10-18: See volume overload treatment below.  AKI today, may improve  with diuresis, encourage patient that she still needs to drink some fluid even with diuresis and repeat BMP, mag in AM.    8: Hypertension: monitor TID and prn (home Norvasc held)   - Remains normotensive, monitor with addition of spironolactone/ BMP  - change to 10/11 given symptoms   - 10/12: BMP with AKI; increase in BP may be from steroids vs. Contracting; reduce spirinolactone to 12.5 mg daily and repeat labs Monday  10-17: Tolerated dose of Toprol.   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  Will monitor for any functional decline, and may reach out to neuro-oncology for prolonged taper depending on symptom severity.  10/10: Finished Decadron taper, discussed with family notifying provider if any worsening symptoms of dizziness, ataxia, or lethargy with this.  If needed to resume, will reach out to Dr. Barbaraann Cao --> resumed 1 mg daily until OP f/u   10: Bilateral rib fractures due to chest compressions -pain control as above   11: V-fib cardiac arrest:  -continue Pacerone 400 mg daily for one week, then 200 mg daily on 10/08--Dropped off 10/15 ? transition, resumed 10/15, will touch base with cardiology regarding plans for transition to BB -> start toprol xl 25 mg 10/17 AM - tolerating              -not a candidate for invasive intervention             -K > 4; Mg > 2 -potassium 3.8 on admission labs, recheck in AM.  Magnesium 1.6, replete 2 g IV today and then start 400 mg twice daily due to  recurrent drops.  Recheck in AM.             -if patient needs diuretics outpatient due to lower extremity edema then cardiology will consider changing to a potassium sparing diuretic such as spironolactone 25 mg daily along with some potassium supplementation and magnesium             -follow-up with Dr. Lynnette Caffey 3 weeks after D/C from CIR    - Daily weights   -10/6 supplement K+  -10-9: Reminding nursing to perform daily weights.will have cards eval today to initiate diuretic and adjust meds given ongoing swelling and mild hypotension  10/10: Cardiology resumed spironolactone 25 mg daily due to peripheral edema, appears to be helping.     10/12: reduce to 12.5 mg daily d/t AKI  10/15: D/w nursing re: resuming daily weights. May need to increase diuretics again d/t swelling; monitorring closely.   10-17: Weights remain downtrending/stable.,  Increased peripheral edema on exam.  Unsure if due to poor nutritional status or truly volume overloaded.  CMP in AM.  Encouraged TED hose.  10/18: Up 5 kg today? Cr increased; gross signs of volume overload on exam.  Chest x-ray read pending, but showing increased central vascular prominence.  No apparent effusions.  20 mg IV Lasix today, then increase spironolactone to 25 mg daily starting tomorrow a.m.  Per cardiology, may need loop diuretic added as  outpatient; would have low threshold to consult them for medication adjustments if no improvement  Filed Weights   06/25/23 1700 06/27/23 0600 06/28/23 0623  Weight: 71 kg 70.4 kg 75.4 kg     12: Stage IV lung cancer/mets to brain:             -continue Keppra 1000 mg BID             -follow-up with Dr. 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 fluids, labs stable, f/u monday  10-7: Creatinine stabilizing at 1.1-1.2; unchange d10-9;  -10-18: Creatinine increasing, 1.4 today, with gross signs of volume overload.  May be cardiorenal, repeat labs in a.m. and encourage patient to drink some fluid today as intakes have been minimal. 10/19 creatinine improved to 1.32, BUN 37 today.  Drinking better  17: Anemia: ~3 gram drop in hemoglobin/no overt bleeding             -follow-up CBC. Most recently has been ~7.7; stable on admission labs             -check Hemoccult X 3 -pending             -has hemorrhoids, constipated             -continue PPI, Pepcid   - hgb stable at 7.8 on 10/5; greater than 8 10/7  -10/14 Hgb stable at 8.3  -10/19 Hgb stable at 8.5   18: DM: A1c = 7.3%, also on Decadron             -start CBGs QID and SSI             -carb modified diet as tolerated  Blood sugars well-controlled, monitor  -10-3: Mild elevation blood sugars over the last few days, likely with improved diet; not on any diabetic regimen as outpatient.  Monitor 1 day and consider initiating low-dose metformin if no improvement.   - 10-4: Patient working on dietary adjustments, mildly improved.  Continue to monitor Recent Labs    06/29/23 0600 06/29/23 1207 06/29/23 1654  GLUCAP 99 114* 156*    10/6 sugars have been elevated due to steroids--decadron down to 1mg  daily and readings improved except for readings around dinner time   -continue current cbg checks and SSI for now   -may be able to stop these soon  10/14-18 CBGs well-controlled continue current regimen; elevated d/t steroids    19: Prior post fossa  craniotomy for tumor resection 12/02/2020: resection of progressive cerebellar mass by Dr. Venetia Maxon. 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 Has chronic intermittent dizziness>>meclizine prn-is well-controlled   - well controlled, no current complaints  -10/5 scheduled meclizine 25mg  tid per pt request -> increase to  50 mg TID 10/11; this was OP dose  - Had prior rxn to scopalamine  - 10/11: Symptoms worsening s/p steroid taper, messaged Dr. Barbaraann Cao regarding resuming 1 mg daily until OP follow up --> he is in agreement with this, resumed 10/11   - 10/12: Symptoms worsening overall; see above, if continuing tomorrow will re-image    - 10/13: Improved, defer further imaging to neuro-oncology/oncology OP   20: External hemorrhoids: continue topical hydrocortisone 2.5%; reordered today, as suppository was prior ordered.-Was well-controlled   21: Intermittent constipation: check for impaction then decide on appropriate regimen-resolved  10-1: Multiple small bowel movements overnight, add MiraLAX 17 mg daily  10-2: Small brown bowel movement this a.m., mushy consistency - improved  Last bowel movement 10/4--hopeful for BM today 10/6  Small bowel movement 10-7, increase MiraLAX to twice daily and add as needed Senokot per family request  10-18 last BM   22.  Malaise.  -Improved today 10/19.  She had chest x-ray indicating atelectasis versus pneumonia.  She was given IV Lasix yesterday.  Follow-up x-ray PA and lateral chest in 3 to 4 weeks recommended.  O2 sat 98% on room air.  No leukocytosis.  No fevers.  Continue to monitor    LOS: 19 days A FACE TO FACE EVALUATION WAS PERFORMED  [name] Dance 06/29/2023, 6:47 PM

## 2023-06-30 LAB — GLUCOSE, CAPILLARY
Glucose-Capillary: 131 mg/dL — ABNORMAL HIGH (ref 70–99)
Glucose-Capillary: 90 mg/dL (ref 70–99)
Glucose-Capillary: 154 mg/dL — ABNORMAL HIGH (ref 70–99)
Glucose-Capillary: 112 mg/dL — ABNORMAL HIGH (ref 70–99)

## 2023-06-30 NOTE — Progress Notes (Signed)
Occupational Therapy Session Note  Patient Details  Name: Virginia [name] MRN: [id number] Date of Birth: June 25, 1956  {CHL IP REHAB OT TIME CALCULATIONS:[id number]}   Short Term Goals: Week 3:  OT Short Term Goal 1 (Week 3): LTG=STG 2/2 ELOS  Skilled Therapeutic Interventions/Progress Updates:    Patient agreeable to participate in OT session. Reports *** pain level.   Patient participated in skilled OT session focusing on ***. Therapist facilitated/assessed/developed/educated/integrated/elicited *** in order to improve/facilitate/promote    Therapy Documentation Precautions:  Precautions Precautions: Fall Restrictions Weight Bearing Restrictions: No   Therapy/Group: Individual Therapy  Limmie Ivianna, OTR/L,CBIS  Supplemental OT - [facility] and [facility] Secure Chat Preferred   06/30/2023, 10:02 PM

## 2023-06-30 NOTE — Progress Notes (Signed)
Occupational Therapy Session Note  Patient Details  Name: Virginia [name] MRN: [id number] Date of Birth: [birth year]  Today's Date: 06/30/2023 OT Individual Time: 0215-0330 OT Individual Time Calculation (min): 75 min    Short Term Goals: Week 1:  OT Short Term Goal 1 (Week 1): Patient will complete toilet transfer with mod A of 1 OT Short Term Goal 1 - Progress (Week 1): Met OT Short Term Goal 2 (Week 1): Patient will maintain sitting balance at EOB for 5 minutes in preparation for BADL task OT Short Term Goal 2 - Progress (Week 1): Met OT Short Term Goal 3 (Week 1): Patient will perform 1 step of LB dressing task OT Short Term Goal 3 - Progress (Week 1): Met  Skilled Therapeutic Interventions/Progress Updates:    Patient seated at w/c LOF with family present at the time of treatment. Patient indicated that she rested well last night and she also mention so back pain from old surgical procedure in her mid back region of a 7 on a 0-10.  The pt went on to complete sit to stands 4x , standing for 3-5 minutes in duration, the pt was encouraged to complete relaxation breathing during rest breaks.  The pt required 1 rest breaks with each cycle.  The pt went on to complete UB exercise using a towel for 1 count of 13 for shld flexion, horizontal abduction, shld rotation, and lg circles. The pt required 1 rest break with each exercise. The pt went on to complete simulated task in LB dressing using theraband  with  MinA following initial cues and demonstration. The pt was indicated throughout the session that she felt dizzy. I provided  a cold compress for the pt to apply to her neck, nursing was informed.  At the end of the session the pt remained at w/c LOF with bilateral LE supported, her call light and bedside table were within reach and all additional needs were addressed prior to me exiting the room.   Therapy Documentation Precautions:  Precautions Precautions: Fall Restrictions Weight  Bearing Restrictions: No   Therapy/Group: Individual Therapy  Lavona Mound 06/30/2023, 4:11 PM

## 2023-06-30 NOTE — Plan of Care (Signed)
  Problem: RH BOWEL ELIMINATION Goal: RH STG MANAGE BOWEL WITH ASSISTANCE Description: STG Manage Bowel with toileting Assistance. Outcome: Progressing Goal: RH STG MANAGE BOWEL W/MEDICATION W/ASSISTANCE Description: STG Manage Bowel with Medication with mod I Assistance. Outcome: Progressing   Problem: RH SKIN INTEGRITY Goal: RH STG SKIN FREE OF INFECTION/BREAKDOWN Description: Manage w min assist Outcome: Progressing Goal: RH STG MAINTAIN SKIN INTEGRITY WITH ASSISTANCE Description: STG Maintain Skin Integrity With min Assistance. Outcome: Progressing

## 2023-06-30 NOTE — Progress Notes (Signed)
PROGRESS NOTE   Subjective/Complaints: No acute events noted overnight.  Multiple family members present in her room today.  ROS: New generalized illness feeling, ongoing dizziness, lethargy; swelling-improved, reports back to baseline Pertinent positives per HPI above.  Patient denies fever, rash, sore throat, blurred vision,  nausea, vomiting, diarrhea, cough, shortness of breath or chest pain, joint or back/neck pain, headache, or mood change.     Objective:   No results found. Recent Labs    06/29/23 0816  WBC 8.9  HGB 8.5*  HCT 25.7*  PLT 243      Recent Labs    06/28/23 0756 06/29/23 0816  NA 143 140  K 4.2 3.7  CL 108 107  CO2 25 24  GLUCOSE 156* 102*  BUN 37* 37*  CREATININE 1.44* 1.32*  CALCIUM 9.1 8.4*     Intake/Output Summary (Last 24 hours) at 06/30/2023 1856 Last data filed at 06/30/2023 1416 Gross per 24 hour  Intake 800 ml  Output --  Net 800 ml     Pressure Injury 06/03/23 Buttocks Right Deep Tissue Pressure Injury - Purple or maroon localized area of discolored intact skin or blood-filled blister due to damage of underlying soft tissue from pressure and/or shear. purple (Active)  06/03/23 1740  Location: Buttocks  Location Orientation: Right  Staging: Deep Tissue Pressure Injury - Purple or maroon localized area of discolored intact skin or blood-filled blister due to damage of underlying soft tissue from pressure and/or shear.  Wound Description (Comments): purple  Present on Admission: Yes     Pressure Injury 06/03/23 Buttocks Left Deep Tissue Pressure Injury - Purple or maroon localized area of discolored intact skin or blood-filled blister due to damage of underlying soft tissue from pressure and/or shear. (Active)  06/03/23   Location: Buttocks  Location Orientation: Left  Staging: Deep Tissue Pressure Injury - Purple or maroon localized area of discolored intact skin or  blood-filled blister due to damage of underlying soft tissue from pressure and/or shear.  Wound Description (Comments):   Present on Admission: Yes    Physical Exam: Vital Signs Blood pressure (!) 150/83, pulse 78, temperature 97.8 F (36.6 C), temperature source Oral, resp. rate 18, height 5\' 2"  (1.575 m), weight 75.7 kg, SpO2 100%.  Constitutional: No distress . Vital signs reviewed.  Sitting in wheelchair, family is painting her nails Eyes: Horizontal nystagmus with gaze in all directions -no longer present at rest but ongoing with EOMI.-unchanged  PERRLA Cardiovascular: RRR mild systolic murmur. No JVD  . Edema 2+ at hips, +1 in ankles.  Respiratory/Chest: Mild bibasilar crackles, worse on left than right.  Nonlabored breathing on room air GI/Abdomen: BS +, non-tender, non-distended Ext: no clubbing, cyanosis, deformity Psych: pleasant and cooperative Skin: C/D/I. No apparent lesions.  Bilateral buttocks DTI as below-not examined today          Neurologic exam: alert and attentive Cognition: AAO to person, place, time and event.  + Mild cognitive, memory deficits -mild delayed recall + Mild dysarthria -continues Insight: Good insight into current condition.  Strength: Moving all 4 extremities to gravity and resistance in chair LUE ataxia -ongoing, unchanged from prior exams  Assessment/Plan: 1. Functional  deficits which require 3+ hours per day of interdisciplinary therapy in a comprehensive inpatient rehab setting. Physiatrist is providing close team supervision and 24 hour management of active medical problems listed below. Physiatrist and rehab team continue to assess barriers to discharge/monitor patient progress toward functional and medical goals  Care Tool:  Bathing    Body parts bathed by patient: Right arm, Left arm, Chest, Abdomen, Right upper leg, Left upper leg, Face, Front perineal area   Body parts bathed by helper: Left lower leg, Right lower leg, Buttocks,  Front perineal area     Bathing assist Assist Level: Moderate Assistance - Patient 50 - 74%     Upper Body Dressing/Undressing Upper body dressing   What is the patient wearing?: Pull over shirt    Upper body assist Assist Level: Contact Guard/Touching assist    Lower Body Dressing/Undressing Lower body dressing      What is the patient wearing?: Underwear/pull up, Pants     Lower body assist Assist for lower body dressing: Moderate Assistance - Patient 50 - 74%     Toileting Toileting    Toileting assist Assist for toileting: Moderate Assistance - Patient 50 - 74%     Transfers Chair/bed transfer  Transfers assist  Chair/bed transfer activity did not occur: Safety/medical concerns  Chair/bed transfer assist level: Contact Guard/Touching assist     Locomotion Ambulation   Ambulation assist      Assist level: Minimal Assistance - Patient > 75% Assistive device: Walker-rolling Max distance: 58'   Walk 10 feet activity   Assist  Walk 10 feet activity did not occur: Safety/medical concerns  Assist level: Contact Guard/Touching assist Assistive device: Walker-rolling   Walk 50 feet activity   Assist Walk 50 feet with 2 turns activity did not occur: Safety/medical concerns  Assist level: Minimal Assistance - Patient > 75% Assistive device: Walker-rolling    Walk 150 feet activity   Assist Walk 150 feet activity did not occur: Safety/medical concerns         Walk 10 feet on uneven surface  activity   Assist Walk 10 feet on uneven surfaces activity did not occur: Safety/medical concerns         Wheelchair     Assist Is the patient using a wheelchair?: Yes Type of Wheelchair: Manual    Wheelchair assist level: Dependent - Patient 0%      Wheelchair 50 feet with 2 turns activity    Assist        Assist Level: Dependent - Patient 0%   Wheelchair 150 feet activity     Assist      Assist Level: Dependent -  Patient 0%   Blood pressure (!) 150/83, pulse 78, temperature 97.8 F (36.6 C), temperature source Oral, resp. rate 18, height 5\' 2"  (1.575 m), weight 75.7 kg, SpO2 100%.   Medical Problem List and Plan: 1. Functional deficits secondary to debility after cardiac arrest and multiple related complications             -patient may shower             -ELOS/Goals: 12-14 days, min assist with PT, OT (now CGA/SPV goals), SLP - discharge date 10/22             -pt has episodes of fatigue/somnolence at baseline per husband. He says she does better in the later morning and early afternoon.   -Continue CIR therapies including PT, OT, and SLP    - 10/15: walking up  to 68 feet; working on cognitive engagement; 13 step goal per family not realistic, has 1st floor setup. D/w family purchasing Roho to prevent skin breakdown.    - 10/10: Reached out to palliative, requesting they touch base with patient and family again prior to discharge 10-22 regarding lingering questions on caregiver role and end-of-life  10/12: Worsening overall dizziness/vertigo and ataxia, progressive over last 2 days; patient opts to wait 1 more day for repeat imaging to evaluate -self reports symptoms improved 10-13; ongoing    2.  Antithrombotics: -DVT/anticoagulation:  Mechanical:  Antiembolism stockings, knee (TED hose) Bilateral lower extremities (age indeterminate left posterior tib DVT). 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             -antiplatelet therapy: none  -10-17: Reinforced use of TED hose when out of bed for increasing edema   3. Pain Management: Tylenol, Norco, oxycodone as needed -Lidoderm patch             -Oxycontin 10 mg q 12 hours             -observe for further sedation. Was somnolent this morning after receiving pain medication.   -10-3: Continue OxyContin 10 mg every 12 hours (is on 13.5 mg  every 12 at home) just short acting to home regimen oxycodone 5 mg every 4 hours as needed     - 10/8: Add PRN methocarbamol for muscle soreness -  not used    10/5-6 pain appears controlled.  4. Mood/Behavior/Sleep: LCSW to evaluate and provide emotional support             -antipsychotic agents: Zyprexa 2.5 mg q HS-at home dose             -Xanax prn -at home dose  -10/14 as needed melatonin 3 mg - improved   5. Neuropsych/cognition: This patient is capable of making decisions on his own behalf.   6. Skin/Wound Care: Routine skin care checks   -10-1: Family inquiring about protein supplementation, will look into supplements given on inpatient.   7. Fluids/Electrolytes/Nutrition: Routine Is and Os and follow-up chemistries   -10-1: See below, potassium borderline, magnesium repleted today.  Rechecking in AM.   - 10-2: Electrolytes repleted, labs normal, monitor  - 10/4: Encourage p.o. fluids due to tachycardia today; BMP in a.m.  -10/6 Potassium 3.2--increase supplement  -BUN/Cr holding fairly steady--continue to push fluids -10-7: Potassium 3.8.  Continue current supplement.  BUN/creatinine stable.  - labs in AM for BMP, Mag given soreness -stable 1/9; AKI 10-12; repeat Monday -10/14 Potassium stable at 4.2 -10-17: CMP, magnesium in a.m.; concerned with albumin, anasarca contributing to poor edema management 10-18: See volume overload treatment below.  AKI today, may improve with diuresis, encourage patient that she still needs to drink some fluid even with diuresis and repeat BMP, mag in AM. 10/19 recheck magnesium tomorrow    8: Hypertension: monitor TID and prn (home Norvasc held)   - Remains normotensive, monitor with addition of spironolactone/ BMP  - change to 10/11 given symptoms   - 10/12: BMP with AKI; increase in BP may be from steroids vs. Contracting; reduce spirinolactone to 12.5 mg daily and repeat labs Monday  10-17: Tolerated dose of Toprol.   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  Will monitor for any functional decline, and may reach out to neuro-oncology for prolonged taper depending on symptom severity.  10/10: Finished Decadron taper, discussed with family notifying provider if any worsening symptoms of dizziness, ataxia, or lethargy with this.  If needed to resume, will reach out to Dr. Barbaraann Cao --> resumed 1 mg daily until OP f/u   10: Bilateral rib fractures due to chest compressions -pain control as above   11: V-fib cardiac arrest:  -continue Pacerone 400 mg daily for one week, then 200 mg daily on 10/08--Dropped off 10/15 ? transition, resumed 10/15, will touch base with cardiology regarding plans for transition to BB -> start toprol xl 25 mg 10/17 AM - tolerating              -not a candidate for invasive intervention             -K > 4; Mg > 2 -potassium 3.8 on admission labs, recheck in AM.  Magnesium 1.6, replete 2 g IV today and then start 400 mg twice daily due to recurrent drops.  Recheck in AM.             -if patient needs diuretics outpatient due to lower extremity edema then cardiology will consider changing to a potassium sparing diuretic such as spironolactone 25 mg daily along with some potassium supplementation and magnesium             -follow-up with Dr. Lynnette Caffey 3 weeks after D/C from CIR    - Daily weights   -10/6 supplement K+  -10-9: Reminding nursing to perform daily weights.will have cards eval today to initiate diuretic and adjust meds given ongoing swelling and mild hypotension  10/10: Cardiology resumed spironolactone 25 mg  daily due to peripheral edema, appears to be helping.     10/12: reduce to 12.5 mg daily d/t AKI  10/15: D/w nursing re: resuming daily weights. May need to increase diuretics again d/t swelling; monitorring closely.   10-17: Weights remain downtrending/stable.,  Increased peripheral edema on exam.  Unsure if due to poor nutritional status or truly volume overloaded.  CMP in AM.  Encouraged TED hose.  10/18: Up 5 kg today? Cr increased; gross signs of volume overload on exam.  Chest x-ray read pending, but showing increased central vascular prominence.  No apparent effusions.  20 mg IV Lasix today, then increase spironolactone to 25 mg daily starting tomorrow a.m.  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 fluids, labs stable, f/u monday  10-7: Creatinine stabilizing at 1.1-1.2; unchange d10-9;  -10-18: Creatinine increasing, 1.4 today, with gross signs of volume overload.  May be cardiorenal, repeat labs in a.m. and encourage patient to drink some fluid  today as intakes have been minimal. 10/19 creatinine improved to 1.32, BUN 37 today.  Drinking better -Recheck tomorrow, discussed fluid intake appears to be doing better.  Discussed plan to repeat labs tomorrow with husband who was concerned about this being rechecked  17: Anemia: ~3 gram drop in hemoglobin/no overt bleeding             -follow-up CBC. Most recently has been ~7.7; stable on admission labs             -check Hemoccult X 3 -pending             -has hemorrhoids, constipated             -continue PPI, Pepcid   - hgb stable at 7.8 on 10/5; greater than 8 10/7  -10/14 Hgb stable at 8.3  -10/19 Hgb stable at 8.5  -Recheck tomorrow   18: DM: A1c = 7.3%, also on Decadron             -start CBGs QID and SSI             -carb modified diet as tolerated  Blood sugars well-controlled, monitor  -10-3: Mild elevation blood sugars over the last few days, likely with improved diet; not on any diabetic regimen as outpatient.  Monitor 1 day and consider initiating low-dose metformin if no improvement.   - 10-4: Patient working on dietary adjustments, mildly improved.  Continue to monitor Recent Labs    06/30/23 0612 06/30/23 1138 06/30/23 1804  GLUCAP 131* 90 154*    10/6 sugars have been elevated due to steroids--decadron down to 1mg  daily and readings improved except for readings around dinner time   -continue current cbg checks and SSI for now   -may be able to stop these soon  10/14-18 CBGs well-controlled continue current regimen; elevated d/t steroids  -CBGs well-controlled    19: Prior post fossa craniotomy for tumor resection 12/02/2020: resection of progressive cerebellar mass by Dr. Venetia Maxon. 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 Has chronic intermittent dizziness>>meclizine prn-is well-controlled   - well controlled, no current complaints  -10/5 scheduled meclizine 25mg  tid per pt request -> increase to 50 mg TID 10/11; this was OP dose  - Had prior rxn to scopalamine  - 10/11:  Symptoms worsening s/p steroid taper, messaged Dr. Barbaraann Cao regarding resuming 1 mg daily until OP follow up --> he is in agreement with this, resumed 10/11   - 10/12: Symptoms worsening overall; see above, if continuing tomorrow will re-image    - 10/13: Improved, defer further imaging to neuro-oncology/oncology OP   20:  External hemorrhoids: continue topical hydrocortisone 2.5%; reordered today, as suppository was prior ordered.-Was well-controlled   21: Intermittent constipation: check for impaction then decide on appropriate regimen-resolved  10-1: Multiple small bowel movements overnight, add MiraLAX 17 mg daily  10-2: Small brown bowel movement this a.m., mushy consistency - improved  Last bowel movement 10/4--hopeful for BM today 10/6  Small bowel movement 10-7, increase MiraLAX to twice daily and add as needed Senokot per family request  10-19 last BM   22.  Malaise.  -Improved today 10/19.  She had chest x-ray indicating atelectasis versus pneumonia.  She was given IV Lasix yesterday.  Follow-up x-ray PA and lateral chest in 3 to 4 weeks recommended.  O2 sat 98% on room air.  No leukocytosis.  No fevers.  Continue to monitor  -10/20 continue to be improved, recheck labs tomorrow    LOS: 20 days A FACE TO FACE EVALUATION WAS PERFORMED  [name] Dance 06/30/2023, 6:56 PM

## 2023-06-30 NOTE — Plan of Care (Signed)
  Problem: RH BOWEL ELIMINATION Goal: RH STG MANAGE BOWEL WITH ASSISTANCE Description: STG Manage Bowel with toileting Assistance. Outcome: Progressing Goal: RH STG MANAGE BOWEL W/MEDICATION W/ASSISTANCE Description: STG Manage Bowel with Medication with mod I Assistance. Outcome: Progressing   Problem: RH BLADDER ELIMINATION Goal: RH STG MANAGE BLADDER WITH ASSISTANCE Description: STG Manage Bladder With toileting Assistance Outcome: Progressing Goal: RH STG MANAGE BLADDER WITH MEDICATION WITH ASSISTANCE Description: STG Manage Bladder With Medication With mod I Assistance. Outcome: Progressing   Problem: RH SKIN INTEGRITY Goal: RH STG SKIN FREE OF INFECTION/BREAKDOWN Description: Manage w min assist Outcome: Progressing Goal: RH STG MAINTAIN SKIN INTEGRITY WITH ASSISTANCE Description: STG Maintain Skin Integrity With min Assistance. Outcome: Progressing   Problem: RH SAFETY Goal: RH STG ADHERE TO SAFETY PRECAUTIONS W/ASSISTANCE/DEVICE Description: STG Adhere to Safety Precautions With cues Assistance/Device. Outcome: Progressing   Problem: RH PAIN MANAGEMENT Goal: RH STG PAIN MANAGED AT OR BELOW PT'S PAIN GOAL Description: < 4 with prns Outcome: Progressing   Problem: RH KNOWLEDGE DEFICIT GENERAL Goal: RH STG INCREASE KNOWLEDGE OF SELF CARE AFTER HOSPITALIZATION Description: Patient and spouse will be able to manage care at discharge with medications and dietary modification using educational resources independently Outcome: Progressing

## 2023-07-01 ENCOUNTER — Other Ambulatory Visit: Payer: Self-pay | Admitting: Hematology and Oncology

## 2023-07-01 ENCOUNTER — Encounter: Payer: Self-pay | Admitting: Hematology and Oncology

## 2023-07-01 ENCOUNTER — Other Ambulatory Visit (HOSPITAL_COMMUNITY): Payer: Self-pay

## 2023-07-01 LAB — BASIC METABOLIC PANEL
Anion gap: 10 (ref 5–15)
BUN: 35 mg/dL — ABNORMAL HIGH (ref 8–23)
CO2: 27 mmol/L (ref 22–32)
Calcium: 9.1 mg/dL (ref 8.9–10.3)
Chloride: 106 mmol/L (ref 98–111)
Creatinine, Ser: 1.29 mg/dL — ABNORMAL HIGH (ref 0.44–1.00)
GFR, Estimated: 46 mL/min — ABNORMAL LOW (ref >60)
Glucose, Bld: 104 mg/dL — ABNORMAL HIGH (ref 70–99)
Potassium: 4.3 mmol/L (ref 3.5–5.1)
Sodium: 143 mmol/L (ref 135–145)

## 2023-07-01 LAB — IRON AND TIBC
Iron: 70 ug/dL (ref 28–170)
Saturation Ratios: 19 % (ref 10.4–31.8)
TIBC: 371 ug/dL (ref 250–450)
UIBC: 301 ug/dL

## 2023-07-01 LAB — FERRITIN: Ferritin: 458 ng/mL — ABNORMAL HIGH (ref 11–307)

## 2023-07-01 LAB — GLUCOSE, CAPILLARY
Glucose-Capillary: 96 mg/dL (ref 70–99)
Glucose-Capillary: 102 mg/dL — ABNORMAL HIGH (ref 70–99)
Glucose-Capillary: 172 mg/dL — ABNORMAL HIGH (ref 70–99)
Glucose-Capillary: 121 mg/dL — ABNORMAL HIGH (ref 70–99)

## 2023-07-01 LAB — CBC
HCT: 24.3 % — ABNORMAL LOW (ref 36.0–46.0)
Hemoglobin: 7.8 g/dL — ABNORMAL LOW (ref 12.0–15.0)
MCH: 34.2 pg — ABNORMAL HIGH (ref 26.0–34.0)
MCHC: 32.1 g/dL (ref 30.0–36.0)
MCV: 106.6 fL — ABNORMAL HIGH (ref 80.0–100.0)
Platelets: 239 10*3/uL (ref 150–400)
RBC: 2.28 MIL/uL — ABNORMAL LOW (ref 3.87–5.11)
RDW: 17.8 % — ABNORMAL HIGH (ref 11.5–15.5)
WBC: 7.9 10*3/uL (ref 4.0–10.5)
nRBC: 0 % (ref 0.0–0.2)

## 2023-07-01 LAB — MAGNESIUM: Magnesium: 1.9 mg/dL (ref 1.7–2.4)

## 2023-07-01 MED ORDER — OLANZAPINE 2.5 MG PO TABS
2.5000 mg | ORAL_TABLET | Freq: Every day | ORAL | 0 refills | Status: DC
  Filled 2023-07-01: qty 30, 30d supply, fill #0

## 2023-07-01 MED ORDER — ENSURE ENLIVE PO LIQD: 237.0000 mL | Freq: Two times a day (BID) | ORAL | Status: DC

## 2023-07-01 MED ORDER — DEXAMETHASONE 2 MG PO TABS
1.0000 mg | ORAL_TABLET | Freq: Every day | ORAL | 0 refills | Status: DC
  Filled 2023-07-01: qty 30, 60d supply, fill #0

## 2023-07-01 MED ORDER — ACETAMINOPHEN 325 MG PO TABS: 325.0000 mg | ORAL_TABLET | ORAL | Status: DC | PRN

## 2023-07-01 MED ORDER — MECLIZINE HCL 25 MG PO TABS
50.0000 mg | ORAL_TABLET | Freq: Three times a day (TID) | ORAL | 0 refills | Status: DC
  Filled 2023-07-01: qty 180, 30d supply, fill #0

## 2023-07-01 MED ORDER — LEVETIRACETAM 1000 MG PO TABS
1000.0000 mg | ORAL_TABLET | Freq: Two times a day (BID) | ORAL | 0 refills | Status: DC
  Filled 2023-07-01: qty 60, 30d supply, fill #0

## 2023-07-01 MED ORDER — BISACODYL 5 MG PO TBEC: 5.0000 mg | DELAYED_RELEASE_TABLET | Freq: Every day | ORAL | Status: DC | PRN

## 2023-07-01 MED ORDER — METOPROLOL SUCCINATE ER 25 MG PO TB24
25.0000 mg | ORAL_TABLET | Freq: Every day | ORAL | 0 refills | Status: DC
  Filled 2023-07-01: qty 30, 30d supply, fill #0

## 2023-07-01 MED ORDER — ALPRAZOLAM 0.25 MG PO TABS
0.2500 mg | ORAL_TABLET | Freq: Two times a day (BID) | ORAL | 0 refills | Status: DC | PRN
  Filled 2023-07-01: qty 30, 15d supply, fill #0

## 2023-07-01 MED ORDER — SENNOSIDES-DOCUSATE SODIUM 8.6-50 MG PO TABS: 2.0000 | ORAL_TABLET | Freq: Every day | ORAL | Status: DC | PRN

## 2023-07-01 MED ORDER — PROSOURCE PLUS PO LIQD: 30.0000 mL | Freq: Two times a day (BID) | ORAL | Status: DC

## 2023-07-01 MED ORDER — POTASSIUM CHLORIDE CRYS ER 20 MEQ PO TBCR
20.0000 meq | EXTENDED_RELEASE_TABLET | Freq: Two times a day (BID) | ORAL | 0 refills | Status: DC
  Filled 2023-07-01: qty 60, 30d supply, fill #0

## 2023-07-01 MED ORDER — MAGNESIUM OXIDE -MG SUPPLEMENT 400 (240 MG) MG PO TABS
400.0000 mg | ORAL_TABLET | Freq: Two times a day (BID) | ORAL | 0 refills | Status: DC
  Filled 2023-07-01: qty 60, 30d supply, fill #0

## 2023-07-01 MED ORDER — SPIRONOLACTONE 25 MG PO TABS
25.0000 mg | ORAL_TABLET | Freq: Every day | ORAL | 0 refills | Status: DC
  Filled 2023-07-01: qty 30, 30d supply, fill #0

## 2023-07-01 NOTE — Progress Notes (Addendum)
Occupational Therapy Discharge Summary  Patient Details  Name: Virginia [name] MRN: [id number] Date of Birth: 1956-03-09  Date of Discharge from OT service:July 01, 2023  Today's Date: 07/01/2023 OT Individual Time: 1045-1130 OT Individual Time Calculation (min): 45 min   Pt greeted seated in wc and agreeable to OT treatment session. Addressed standing balance/endurance with standing alternating toe taps on cones-3 sets of 10 with extended rest breaks in between sets. OT issued home UB exercise program awith red theraband 10x.  Straight Arm Pulls x10 Straight Arm Raise x10 Side Arm Raise x10 Over Head Pull Down x10 Forearm Pull x10 Triceps Elbow Extension x10 Bicep Curl x10  Pt ambulated 30 feet w/ RW and CGA. Pt returned to room and left seated in wc with family present and needs met.   Patient has met 8 of 8 long term goals due to improved activity tolerance, improved balance, postural control, ability to compensate for deficits, functional use of  LEFT upper and LEFT lower extremity, improved attention, improved awareness, and improved coordination.  Patient to discharge at [facility] Assist level.  Patient's care partner is independent to provide the necessary physical assistance at discharge.    Reasons goals not met: n/a  Recommendation:  Patient will benefit from ongoing skilled OT services in outpatient setting to continue to advance functional skills in the area of BADL, iADL, and Reduce care partner burden.  Equipment: Hospital bed  Reasons for discharge: treatment goals met and discharge from hospital  Patient/family agrees with progress made and goals achieved: Yes  OT Discharge Precautions/Restrictions  Precautions Precautions: Fall Restrictions Weight Bearing Restrictions: No Pain  Nursing administered pain meds ADL ADL Eating: Set up Grooming: Setup Upper Body Bathing: Supervision/safety Lower Body Bathing: Contact guard Upper Body Dressing:  Supervision/safety Lower Body Dressing: Contact guard Toileting: Contact guard Toilet Transfer: Contact guard Tub/Shower Transfer: Administrator, arts: Contact guard Vision Baseline Vision/History: 0 No visual deficits Cognition Cognition Overall Cognitive Status: History of cognitive impairments - at baseline Arousal/Alertness: Awake/alert Orientation Level: Person;Place;Situation Person: Oriented Place: Oriented Situation: Oriented Memory: Impaired Memory Impairment: Storage deficit;Retrieval deficit;Decreased recall of new information Safety/Judgment: Appears intact Brief Interview for Mental Status (BIMS) Repetition of Three Words (First Attempt): 3 Temporal Orientation: Year: Missed by more than 5 years Temporal Orientation: Month: Accurate within 5 days Temporal Orientation: Day: Correct Recall: "Sock": Yes, no cue required Recall: "Blue": Yes, no cue required Recall: "Bed": Yes, no cue required BIMS Summary Score: 12 Sensation Sensation Light Touch: Appears Intact Hot/Cold: Appears Intact Coordination Gross Motor Movements are Fluid and Coordinated: No Fine Motor Movements are Fluid and Coordinated: No Coordination and Movement Description: SMOOTHNESS AND ACCURACY IMPROVED SINCE EVAL BUT STILL PERSIST Motor  Motor Motor: Hemiplegia Motor - Discharge Observations: 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, strength adn conditionoing improved since eval Mobility  Bed Mobility Supine to Sit: Supervision/Verbal cueing Sit to Supine: Supervision/Verbal cueing Transfers Sit to Stand: Contact Guard/Touching assist Stand to Sit: Contact Guard/Touching assist  Balance Static Sitting Balance Static Sitting - Balance Support: Feet supported Static Sitting - Level of Assistance: 5: Stand by assistance Dynamic Sitting Balance Dynamic Sitting - Balance Support: Feet supported Dynamic Sitting - Level of Assistance: 5: Stand by  assistance Static Standing Balance Static Standing - Balance Support: During functional activity Static Standing - Level of Assistance: 5: Stand by assistance;4: Min assist (CGA) Dynamic Standing Balance Dynamic Standing - Balance Support: During functional activity Dynamic Standing - Level of Assistance:  4: Min assist;5: Stand by assistance (CGA) Extremity/Trunk Assessment RUE Assessment RUE Assessment: Within Functional Limits LUE Assessment LUE Assessment: Exceptions to [facility] Active Range of Motion (AROM) Comments: limited to 80 degrees of FF, 3+/5 strength on L side   Mal Amabile 07/01/2023, 2:09 PM

## 2023-07-01 NOTE — Progress Notes (Incomplete)
Inpatient Rehabilitation Discharge Medication Review by a Pharmacist  A complete drug regimen review was completed for this patient to identify any potential clinically significant medication issues.  High Risk Drug Classes Is patient taking? Indication by Medication  Antipsychotic Yes Olanzapine - mood  Anticoagulant No   Antibiotic No   Opioid Yes Xtampza - pain   Antiplatelet No   Hypoglycemics/insulin No   Vasoactive Medication Yes Spironolactone, Metoprolol -  HTN  Chemotherapy No   Other Yes Keppra - seizure ppx Mg Oxide, Vit E, Kcl, MVI - supplement Alprazolam - PRN anxiety Dexamethasone -  Post-arrest ALI  Hydrocortisone cream - PRN hemorrhoids Meclizine - vertigo ondansetron - nausea/vomiting PRN Albuterol - SOB Pantoprazole - GERD      Type of Medication Issue Identified Description of Issue Recommendation(s)  Drug Interaction(s) (clinically significant)     Duplicate Therapy     Allergy     No Medication Administration End Date     Incorrect Dose     Additional Drug Therapy Needed     Significant med changes from prior encounter (inform family/care partners about these prior to discharge). Amlodipine, Lasix, FA stopped at DC Communicate medication changes with patient/family at discharge  Other       Clinically significant medication issues were identified that warrant physician communication and completion of prescribed/recommended actions by midnight of the next day:  No  Pharmacist comments: n/a  Time spent performing this drug regimen review (minutes): 20  ***

## 2023-07-01 NOTE — Plan of Care (Signed)
  Problem: RH BOWEL ELIMINATION Goal: RH STG MANAGE BOWEL WITH ASSISTANCE Description: STG Manage Bowel with toileting Assistance. Outcome: Progressing Goal: RH STG MANAGE BOWEL W/MEDICATION W/ASSISTANCE Description: STG Manage Bowel with Medication with mod I Assistance. Outcome: Progressing   Problem: RH BLADDER ELIMINATION Goal: RH STG MANAGE BLADDER WITH ASSISTANCE Description: STG Manage Bladder With toileting Assistance Outcome: Progressing Goal: RH STG MANAGE BLADDER WITH MEDICATION WITH ASSISTANCE Description: STG Manage Bladder With Medication With mod I Assistance. Outcome: Progressing   Problem: RH SKIN INTEGRITY Goal: RH STG SKIN FREE OF INFECTION/BREAKDOWN Description: Manage w min assist Outcome: Progressing Goal: RH STG MAINTAIN SKIN INTEGRITY WITH ASSISTANCE Description: STG Maintain Skin Integrity With min Assistance. Outcome: Progressing   Problem: RH SAFETY Goal: RH STG ADHERE TO SAFETY PRECAUTIONS W/ASSISTANCE/DEVICE Description: STG Adhere to Safety Precautions With cues Assistance/Device. Outcome: Progressing   Problem: RH PAIN MANAGEMENT Goal: RH STG PAIN MANAGED AT OR BELOW PT'S PAIN GOAL Description: < 4 with prns Outcome: Progressing   Problem: RH KNOWLEDGE DEFICIT GENERAL Goal: RH STG INCREASE KNOWLEDGE OF SELF CARE AFTER HOSPITALIZATION Description: Patient and spouse will be able to manage care at discharge with medications and dietary modification using educational resources independently Outcome: Progressing

## 2023-07-01 NOTE — Plan of Care (Signed)
  Problem: RH Cognition - SLP Goal: RH LTG Patient will demonstrate orientation with cues Description:  LTG:  Patient will demonstrate orientation to person/place/time/situation with cues (SLP)   Outcome: Completed/Met   Problem: RH Problem Solving Goal: LTG Patient will demonstrate problem solving for (SLP) Description: LTG:  Patient will demonstrate problem solving for basic/complex daily situations with cues  (SLP) Outcome: Completed/Met   Problem: RH Memory Goal: LTG Patient will use memory compensatory aids to (SLP) Description: LTG:  Patient will use memory compensatory aids to recall biographical/new, daily complex information with cues (SLP) Outcome: Completed/Met   Problem: RH Awareness Goal: LTG: Patient will demonstrate awareness during functional activites type of (SLP) Description: LTG: Patient will demonstrate awareness during functional activites type of (SLP) Outcome: Completed/Met   Problem: RH Expression Communication Goal: LTG Patient will increase speech intelligibility (SLP) Description: LTG: Patient will increase speech intelligibility at word/phrase/conversation level with cues, % of the time (SLP) Outcome: Completed/Met

## 2023-07-01 NOTE — Progress Notes (Signed)
Speech Language Pathology Discharge Summary  Patient Details  Name: NIKITA ZIOMEK MRN: [id number] Date of Birth: Mar 19, 1956  Date of Discharge from SLP service:July 01, 2023  Today's Date: 07/01/2023 SLP Individual Time: 7829-5621 SLP Individual Time Calculation (min): 45 min   Skilled Therapeutic Interventions: Skilled treatment session focused on cognitive goals. Patient appeared brighter today and recalled functional information regarding recent events and asking appropriate questions about test results. All questions deferred to the physician. Patient's husband also requested SLP help facilitate functional conversations regarding end of life decisions. Paperwork and resources were provided. SLP reinforced education regarding maximizing cognitive engagement at home, both verbalized understanding. Patient left upright in wheelchair with alarm on and all needs within reach.   Pain: No/Denies Pain  Patient has met 5 of 5 long term goals.  Patient to discharge at [facility] level.   Reasons goals not met: N/A   Clinical Impression/Discharge Summary: Patient has made functional gains and has met 5 of 5 LTGs this admisison. Currently, patient requires overall Min A verbal cues to complete functional and familiar tasks safely in regards to problem solving, recall of functional information, and emergent awareness. Patient also demonstrates improved intelligibility at the sentence level with overall supervision level verbal cues for use of compensatory strategies. Patient and family education is complete and patient will discharge home with 24 hour supervision from family. Patient would benefit from f/u SLP services to maximize her cognitive functioning and overall functional independence in order to reduce caregiver burden.   Care Partner:  Caregiver Able to Provide Assistance: Yes  Type of Caregiver Assistance: Physical;Cognitive  Recommendation:  Home Health SLP;24 hour  supervision/assistance  Rationale for SLP Follow Up: Maximize cognitive function and independence;Reduce caregiver burden;Maximize functional communication   Equipment: N/A   Reasons for discharge: Treatment goals met;Discharged from hospital   Patient/Family Agrees with Progress Made and Goals Achieved: Yes    Kathie Posa 07/01/2023, 12:54 PM

## 2023-07-01 NOTE — Progress Notes (Signed)
PROGRESS NOTE   Subjective/Complaints: No events overnight. Vitals stable. Labs with Cr 1.29, BUN 35 - stabl.e HgB down to 7.8 from 8.5,  Last BM 10/19   ROS: New generalized illness feeling, ongoing dizziness, lethargy; swelling-improved, reports back to baseline Pertinent positives per HPI above.  Patient denies fever, rash, sore throat, blurred vision,  nausea, vomiting, diarrhea, cough, shortness of breath or chest pain, joint or back/neck pain, headache, or mood change.     Objective:   No results found. Recent Labs    06/29/23 0816 07/01/23 0554  WBC 8.9 7.9  HGB 8.5* 7.8*  HCT 25.7* 24.3*  PLT 243 239      Recent Labs    06/29/23 0816 07/01/23 0554  NA 140 143  K 3.7 4.3  CL 107 106  CO2 24 27  GLUCOSE 102* 104*  BUN 37* 35*  CREATININE 1.32* 1.29*  CALCIUM 8.4* 9.1     Intake/Output Summary (Last 24 hours) at 07/01/2023 9629 Last data filed at 06/30/2023 1416 Gross per 24 hour  Intake 560 ml  Output --  Net 560 ml     Pressure Injury 06/03/23 Buttocks Right Deep Tissue Pressure Injury - Purple or maroon localized area of discolored intact skin or blood-filled blister due to damage of underlying soft tissue from pressure and/or shear. purple (Active)  06/03/23 1740  Location: Buttocks  Location Orientation: Right  Staging: Deep Tissue Pressure Injury - Purple or maroon localized area of discolored intact skin or blood-filled blister due to damage of underlying soft tissue from pressure and/or shear.  Wound Description (Comments): purple  Present on Admission: Yes     Pressure Injury 06/03/23 Buttocks Left Deep Tissue Pressure Injury - Purple or maroon localized area of discolored intact skin or blood-filled blister due to damage of underlying soft tissue from pressure and/or shear. (Active)  06/03/23   Location: Buttocks  Location Orientation: Left  Staging: Deep Tissue Pressure Injury -  Purple or maroon localized area of discolored intact skin or blood-filled blister due to damage of underlying soft tissue from pressure and/or shear.  Wound Description (Comments):   Present on Admission: Yes    Physical Exam: Vital Signs Blood pressure 138/84, pulse 80, temperature 97.6 F (36.4 C), temperature source Oral, resp. rate 17, height 5\' 2"  (1.575 m), weight 73 kg, SpO2 98%.  Constitutional: No distress . Vital signs reviewed.  Sitting in wheelchair, family is painting her nails Eyes: Horizontal nystagmus with gaze in all directions -no longer present at rest but ongoing with EOMI.-unchanged  PERRLA Cardiovascular: RRR mild systolic murmur. No JVD  . Edema 2+ at hips, +1 in ankles.  Respiratory/Chest: Mild bibasilar crackles, worse on left than right.  Nonlabored breathing on room air GI/Abdomen: BS +, non-tender, non-distended Ext: no clubbing, cyanosis, deformity Psych: pleasant and cooperative Skin: C/D/I. No apparent lesions.  Bilateral buttocks DTI as below-not examined today          Neurologic exam: alert and attentive Cognition: AAO to person, place, time and event.  + Mild cognitive, memory deficits -mild delayed recall + Mild dysarthria -continues Insight: Good insight into current condition.  Strength: Moving all 4 extremities to gravity  and resistance in chair LUE ataxia -ongoing, unchanged from prior exams  Assessment/Plan: 1. Functional deficits which require 3+ hours per day of interdisciplinary therapy in a comprehensive inpatient rehab setting. Physiatrist is providing close team supervision and 24 hour management of active medical problems listed below. Physiatrist and rehab team continue to assess barriers to discharge/monitor patient progress toward functional and medical goals  Care Tool:  Bathing    Body parts bathed by patient: Right arm, Left arm, Chest, Abdomen, Right upper leg, Left upper leg, Face, Front perineal area   Body parts bathed  by helper: Left lower leg, Right lower leg, Buttocks, Front perineal area     Bathing assist Assist Level: Moderate Assistance - Patient 50 - 74%     Upper Body Dressing/Undressing Upper body dressing   What is the patient wearing?: Pull over shirt    Upper body assist Assist Level: Contact Guard/Touching assist    Lower Body Dressing/Undressing Lower body dressing      What is the patient wearing?: Underwear/pull up, Pants     Lower body assist Assist for lower body dressing: Moderate Assistance - Patient 50 - 74%     Toileting Toileting    Toileting assist Assist for toileting: Moderate Assistance - Patient 50 - 74%     Transfers Chair/bed transfer  Transfers assist  Chair/bed transfer activity did not occur: Safety/medical concerns  Chair/bed transfer assist level: Contact Guard/Touching assist     Locomotion Ambulation   Ambulation assist      Assist level: Minimal Assistance - Patient > 75% Assistive device: Walker-rolling Max distance: 3'   Walk 10 feet activity   Assist  Walk 10 feet activity did not occur: Safety/medical concerns  Assist level: Contact Guard/Touching assist Assistive device: Walker-rolling   Walk 50 feet activity   Assist Walk 50 feet with 2 turns activity did not occur: Safety/medical concerns  Assist level: Minimal Assistance - Patient > 75% Assistive device: Walker-rolling    Walk 150 feet activity   Assist Walk 150 feet activity did not occur: Safety/medical concerns         Walk 10 feet on uneven surface  activity   Assist Walk 10 feet on uneven surfaces activity did not occur: Safety/medical concerns         Wheelchair     Assist Is the patient using a wheelchair?: Yes Type of Wheelchair: Manual    Wheelchair assist level: Dependent - Patient 0%      Wheelchair 50 feet with 2 turns activity    Assist        Assist Level: Dependent - Patient 0%   Wheelchair 150 feet activity      Assist      Assist Level: Dependent - Patient 0%   Blood pressure 138/84, pulse 80, temperature 97.6 F (36.4 C), temperature source Oral, resp. rate 17, height 5\' 2"  (1.575 m), weight 73 kg, SpO2 98%.   Medical Problem List and Plan: 1. Functional deficits secondary to debility after cardiac arrest and multiple related complications             -patient may shower             -ELOS/Goals: 12-14 days, min assist with PT, OT (now CGA/SPV goals), SLP - discharge date 10/22             -pt has episodes of fatigue/somnolence at baseline per husband. He says she does better in the later morning and early afternoon.   -Continue  CIR therapies including PT, OT, and SLP    - 10/15: walking up to 68 feet; working on cognitive engagement; 13 step goal per family not realistic, has 1st floor setup. D/w family purchasing Roho to prevent skin breakdown.    - 10/10: Reached out to palliative, requesting they touch base with patient and family again prior to discharge 10-22 regarding lingering questions on caregiver role and end-of-life  10/12: Worsening overall dizziness/vertigo and ataxia, progressive over last 2 days; patient opts to wait 1 more day for repeat imaging to evaluate -self reports symptoms improved 10-13; ongoing    2.  Antithrombotics: -DVT/anticoagulation:  Mechanical:  Antiembolism stockings, knee (TED hose) Bilateral lower extremities (age indeterminate left posterior tib DVT). 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             -antiplatelet therapy: none  -10-17: Reinforced use of TED hose when out of bed for increasing edema   3. Pain Management: Tylenol, Norco, oxycodone as needed -Lidoderm patch             -Oxycontin 10 mg q 12 hours             -observe for further sedation. Was somnolent this morning after receiving pain medication.   -10-3: Continue  OxyContin 10 mg every 12 hours (is on 13.5 mg every 12 at home) just short acting to home regimen oxycodone 5 mg every 4 hours as needed     - 10/8: Add PRN methocarbamol for muscle soreness -  not used    10/5-6 pain appears controlled.  4. Mood/Behavior/Sleep: LCSW to evaluate and provide emotional support             -antipsychotic agents: Zyprexa 2.5 mg q HS-at home dose             -Xanax prn -at home dose  -10/14 as needed melatonin 3 mg - improved   5. Neuropsych/cognition: This patient is capable of making decisions on his own behalf.   6. Skin/Wound Care: Routine skin care checks   -10-1: Family inquiring about protein supplementation, will look into supplements given on inpatient.   7. Fluids/Electrolytes/Nutrition: Routine Is and Os and follow-up chemistries   -10-1: See below, potassium borderline, magnesium repleted today.  Rechecking in AM.   - 10-2: Electrolytes repleted, labs normal, monitor  - 10/4: Encourage p.o. fluids due to tachycardia today; BMP in a.m.  -10/6 Potassium 3.2--increase supplement  -BUN/Cr holding fairly steady--continue to push fluids -10-7: Potassium 3.8.  Continue current supplement.  BUN/creatinine stable.  - labs in AM for BMP, Mag given soreness -stable 1/9; AKI 10-12; repeat Monday -10/14 Potassium stable at 4.2 -10-17: CMP, magnesium in a.m.; concerned with albumin, anasarca contributing to poor edema management 10-18: See volume overload treatment below.  AKI today, may improve with diuresis, encourage patient that she still needs to drink some fluid even with diuresis and repeat BMP, mag in AM. 10/19 recheck magnesium tomorrow - 1.9, stable 10/21    8: Hypertension: monitor TID and prn (home Norvasc held)   - Remains normotensive, monitor with addition of spironolactone/ BMP  - change to 10/11 given symptoms   - 10/12: BMP with AKI; increase in BP may be from steroids vs. Contracting; reduce spirinolactone to 12.5 mg daily and repeat labs  Monday  10-17: Tolerated dose of Toprol.   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  Will monitor for any functional decline, and may reach out to neuro-oncology for prolonged taper depending on symptom severity.  10/10: Finished Decadron taper, discussed with family notifying provider if any worsening symptoms of dizziness, ataxia, or lethargy with this.  If needed to resume, will reach out to Dr. Barbaraann Cao --> resumed 1 mg daily until OP f/u   10: Bilateral rib fractures due to chest compressions -pain control as above   11: V-fib cardiac arrest:  -continue Pacerone 400 mg daily for one week, then 200 mg daily on 10/08--Dropped off 10/15 ? transition, resumed 10/15, will touch base with cardiology regarding plans for transition to BB -> start toprol xl 25 mg 10/17 AM - tolerating              -not a candidate for invasive intervention             -K > 4; Mg > 2 -potassium 3.8 on admission labs, recheck in AM.  Magnesium 1.6, replete 2 g IV today and then start 400 mg twice daily due to recurrent drops.  Recheck in AM.             -if patient needs diuretics outpatient due to lower extremity edema then cardiology will consider changing to a potassium sparing diuretic such as spironolactone 25 mg daily along with some potassium supplementation and magnesium             -follow-up with Dr. Lynnette Caffey 3 weeks after D/C from CIR    - Daily weights   -10/6 supplement K+  -10-9: Reminding nursing to perform daily weights.will have cards eval today to initiate diuretic and  adjust meds given ongoing swelling and mild hypotension  10/10: Cardiology resumed spironolactone 25 mg daily due to peripheral edema, appears to be helping.     10/12: reduce to 12.5 mg daily d/t AKI  10/15: D/w nursing re: resuming daily weights. May need to increase diuretics again d/t swelling; monitorring closely.   10-17: Weights remain downtrending/stable.,  Increased peripheral edema on exam.  Unsure if due to poor nutritional status or truly volume overloaded.  CMP in AM.  Encouraged TED hose.  10/18: Up 5 kg today? Cr increased; gross signs of volume overload on exam.  Chest x-ray read pending, but showing increased central vascular prominence.  No apparent effusions.  20 mg IV Lasix today, then increase spironolactone to 25 mg daily starting tomorrow a.m.  Per cardiology, may need loop diuretic added as outpatient; would have low threshold to consult them for medication adjustments if no improvement  10/21: Down to 73 kg; improved symtpoms; Cr stable - monitor  Filed Weights   06/28/23 0623 06/30/23 0500 07/01/23 0500  Weight: 75.4 kg 75.7 kg 73 kg     12: Stage IV lung cancer/mets to brain:             -continue Keppra 1000  mg BID             -follow-up with Dr. 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 fluids, labs stable, f/u monday  10-7: Creatinine stabilizing at 1.1-1.2; unchange  d10-9;  -10-18: Creatinine increasing, 1.4 today, with gross signs of volume overload.  May be cardiorenal, repeat labs in a.m. and encourage patient to drink some fluid today as intakes have been minimal. 10/19 creatinine improved to 1.32, BUN 37 today.  Drinking better -10/21: Labs stable; Cr 1.29  17: Anemia: ~3 gram drop in hemoglobin/no overt bleeding             -follow-up CBC. Most recently has been ~7.7; stable on admission labs             -check Hemoccult X 3 -pending             -has hemorrhoids, constipated             -continue PPI, Pepcid   - hgb stable at 7.8 on 10/5; greater than 8 10/7  -10/14 Hgb stable at 8.3  -10/19 Hgb stable at 8.5  -10/21: Hgb 7.8; add iron studies    18: DM: A1c = 7.3%, also on Decadron             -start CBGs QID and SSI             -carb modified diet as tolerated  Blood sugars well-controlled, monitor  -10-3: Mild elevation blood sugars over the last few days, likely with improved diet; not on any diabetic regimen as outpatient.  Monitor 1 day and consider initiating low-dose metformin if no improvement.   - 10-4: Patient working on dietary adjustments, mildly improved.  Continue to monitor Recent Labs    06/30/23 1804 06/30/23 2116 07/01/23 0556  GLUCAP 154* 112* 96    10/6 sugars have been elevated due to steroids--decadron down to 1mg  daily and readings improved except for readings around dinner time   -continue current cbg checks and SSI for now   -may be able to stop these soon  10/14-18 CBGs well-controlled continue current regimen; elevated d/t steroids  -CBGs well-controlled    19: Prior post fossa craniotomy for tumor resection 12/02/2020: resection of progressive cerebellar mass by Dr. Venetia Maxon. 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 Has chronic intermittent dizziness>>meclizine prn-is well-controlled   - well controlled, no current complaints  -10/5 scheduled meclizine 25mg  tid per pt request -> increase to 50 mg TID 10/11; this was OP  dose  - Had prior rxn to scopalamine  - 10/11: Symptoms worsening s/p steroid taper, messaged Dr. Barbaraann Cao regarding resuming 1 mg daily until OP follow up --> he is in agreement with this, resumed  10/11   - 10/12: Symptoms worsening overall; see above, if continuing tomorrow will re-image    - 10/13: Improved, defer further imaging to neuro-oncology/oncology OP   20: External hemorrhoids: continue topical hydrocortisone 2.5%; reordered today, as suppository was prior ordered.-Was well-controlled   21: Intermittent constipation: check for impaction then decide on appropriate regimen-resolved  10-1: Multiple small bowel movements overnight, add MiraLAX 17 mg daily  10-2: Small brown bowel movement this a.m., mushy consistency - improved  Last bowel movement 10/4--hopeful for BM today 10/6  Small bowel movement 10-7, increase MiraLAX to twice daily and add as needed Senokot per family request  10-19 last BM    22.  Malaise.  -Improved today 10/19.  She had chest x-ray indicating atelectasis versus pneumonia.  She was given IV Lasix yesterday.  Follow-up x-ray PA and lateral chest in 3 to 4 weeks recommended.  O2 sat 98% on room air.  No leukocytosis.  No fevers.  Continue to monitor  -10/20 continue to be improved, recheck labs tomorrow    LOS: 21 days A FACE TO FACE EVALUATION WAS PERFORMED  Angelina Sheriff 07/01/2023, 8:38 AM

## 2023-07-01 NOTE — Progress Notes (Incomplete)
Nutrition Follow-up  DOCUMENTATION CODES:   Not applicable  INTERVENTION:  ***   NUTRITION DIAGNOSIS:   Increased nutrient needs related to catabolic illness (metastatic lung cancer, DTI) as evidenced by estimated needs.    GOAL:   Patient will meet greater than or equal to 90% of their needs    MONITOR:   PO intake, Supplement acceptance, Labs, Weight trends, Skin, I & O's  REASON FOR ASSESSMENT:    Follow up   ASSESSMENT:   67 year old female with PMHx of metastatic lung cancer with lesions of the brain (currently on Pem/Pem maintenance), s/p craniotomy and resection of progressive cerebellar mass on 12/02/20, DVT, HTN, HLD, tobacco abuse, anxiety admitted following a witnessed out-of-hospital V-fib cardiac arrest intubated on presentation 9/23 and extubated on 9/25, also with postarrest acute lung injury, possible seizure, multiple bilateral rib fractures due to CPR. Admitted to [facility] on 06/10/23 due to functional deficits secondary to out-of-hospital cardiac arrest.  25-OH vitamin D level Vitamin D level within normal range.     Diet Order:   Diet Order             Diet Carb Modified Fluid consistency: Thin; Room service appropriate? Yes  Diet effective now                   EDUCATION NEEDS:   Education needs have been addressed  Skin:  Skin Assessment: Skin Integrity Issues: Skin Integrity Issues:: DTI DTI: bilateral buttocks  Last BM:  06/11/23 - small type 6  Height:   Ht Readings from Last 1 Encounters:  06/21/23 5\' 2"  (1.575 m)    Weight:   Wt Readings from Last 1 Encounters:  07/01/23 73 kg    Ideal Body Weight:     BMI:  Body mass index is 29.44 kg/m.  Estimated Nutritional Needs:   Kcal:  1800-2000  Protein:  90-100 grams  Fluid:  1.8-2 L/day    ***

## 2023-07-01 NOTE — Progress Notes (Signed)
Physical Therapy Discharge Summary  Patient Details  Name: Virginia [name] MRN: [id number] Date of Birth: [birth year]  Date of Discharge from PT service:July 01, 2023  Today's Date: 07/01/2023 PT Individual Time: 1300-1408 PT Individual Time Calculation (min): 68 min    Patient has met {NUMBERS 0-12:18577} of {NUMBERS 0-12:18577} long term goals due to {due [id number]}.  Patient to discharge at [facility] level {[id number]}.   Patient's care partner is independent to provide the necessary physical assistance at discharge.  Reasons goals not met: ***  Recommendation:  Patient will benefit from ongoing skilled PT services in outpatient setting to continue to advance safe functional mobility, address ongoing impairments in strength, balance, endurance, attention to R UE/LE, gait, and minimize fall risk.  Equipment: WC, RW, hospital bed   Reasons for discharge: treatment goals met and discharge from hospital  Patient/family agrees with progress made and goals achieved: Yes  PT Discharge Precautions/Restrictions Precautions Precautions: Fall;Other (comment) Precaution Comments: L hemi Restrictions Weight Bearing Restrictions: No Pain Interference Pain Interference Pain Effect on Sleep: 2. Occasionally Pain Interference with Therapy Activities: 1. Rarely or not at all Pain Interference with Day-to-Day Activities: 1. Rarely or not at all Vision/Perception  Vision - History Ability to See in Adequate Light: 0 Adequate  Cognition Overall Cognitive Status: History of cognitive impairments - at baseline Arousal/Alertness: Awake/alert Orientation Level: Oriented X4 Memory: Impaired Memory Impairment: Storage deficit;Retrieval deficit;Decreased recall of new information Awareness: Impaired Problem Solving: Impaired Organizing: Impaired Self Monitoring: Impaired Safety/Judgment: Appears intact Sensation Sensation Light Touch: Appears Intact Hot/Cold: Appears  Intact Proprioception: Not tested Stereognosis: Not tested Coordination Gross Motor Movements are Fluid and Coordinated: No Fine Motor Movements are Fluid and Coordinated: No Coordination and Movement Description: SMOOTHNESS AND ACCURACY IMPROVED SINCE EVAL BUT STILL PERSIST Motor  Motor Motor: Hemiplegia Motor - Discharge Observations: 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, strength adn conditionoing improved since eval  Mobility Bed Mobility Bed Mobility: Sit to Supine;Supine to Sit;Rolling Left;Rolling Right Rolling Right: Supervision/verbal cueing Rolling Left: Supervision/Verbal cueing Supine to Sit: Supervision/Verbal cueing Sit to Supine: Supervision/Verbal cueing Transfers Transfers: Sit to Stand;Stand to Sit;Stand Pivot Transfers Sit to Stand: Contact Guard/Touching assist Stand to Sit: Contact Guard/Touching assist Stand Pivot Transfers: Contact Guard/Touching assist Stand Pivot Transfer Details: Verbal cues for safe use of DME/AE;Verbal cues for precautions/safety;Verbal cues for technique Transfer (Assistive device): Rolling walker Locomotion  Gait Ambulation: Yes Gait Assistance: Contact Guard/Touching assist Gait Distance (Feet): 40 Feet Assistive device: Rolling walker Gait Assistance Details: Verbal cues for precautions/safety;Verbal cues for safe use of DME/AE;Verbal cues for gait pattern Gait Gait: Yes Gait Pattern: Impaired Gait Pattern: Step-to pattern;Shuffle;Wide base of support;Decreased hip/knee flexion - right;Decreased stride length Gait velocity: decreased Stairs / Additional Locomotion Stairs: Yes Stairs Assistance: Contact Guard/Touching assist Stair Management Technique: One rail Left Number of Stairs: 4 Height of Stairs: 6 Wheelchair Mobility Wheelchair Mobility: Yes Wheelchair Assistance: Total Assistance - Patient <25% Wheelchair Parts Management: Needs assistance  Trunk/Postural Assessment  Cervical  Assessment Cervical Assessment: Exceptions to [facility] (R lateral flexion, cervical rotation prefernce, can correct with cues) Thoracic Assessment Thoracic Assessment: Exceptions to [facility] (rounded shoulders) Lumbar Assessment Lumbar Assessment: Exceptions to [facility] (posterior pelvic tilt) Postural Control Postural Control: Deficits on evaluation Head Control: R lateral flexion/cervical rotation preference, can correct with cues Trunk Control: deconditioned, R lateral trunk lean/posterior lean, improved since eval Righting Reactions: delayed Protective Responses: decreased Postural Limitations: decreased  Balance Balance Balance Assessed: Yes Static Sitting Balance Static Sitting - Balance  Support: Feet supported Static Sitting - Level of Assistance: 5: Stand by assistance Dynamic Sitting Balance Dynamic Sitting - Balance Support: Feet supported Dynamic Sitting - Level of Assistance: 5: Stand by assistance Static Standing Balance Static Standing - Balance Support: During functional activity Static Standing - Level of Assistance: 5: Stand by assistance;4: Min assist (CGA) Dynamic Standing Balance Dynamic Standing - Balance Support: During functional activity Dynamic Standing - Level of Assistance: 4: Min assist;5: Stand by assistance (CGA) Extremity Assessment  RUE Assessment RUE Assessment: Within Functional Limits LUE Assessment LUE Assessment: Exceptions to [facility] Active Range of Motion (AROM) Comments: limited to 80 degrees of FF, 3+/5 strength on L side RLE Assessment RLE Assessment: Exceptions to [facility] General Strength Comments: grossly 4-/5 RLE Strength RLE Overall Strength: Deficits LLE Assessment LLE Assessment: Exceptions to [facility] General Strength Comments: grossly 3+/5   Today's Interventions  Pt seated in WC upon arrival with husband in room upon arrival. Pt agreeable to therapy. Pt denies any pain. Pt requesting Xanax for anxiety for stair navigation, notified nursing, nursing  present to administer medication. Therapist utilized therapeutic use of self to offer support and encouragement.   Pt performed sit to stand and stand pivot transfer with RW and CGA throughout session, with verbal cues for UE positioning pt husband returned demonstration.   Pt ambulated 40 feet with RW and CGA, verbal cues for increased step length/reciprocal gait. Pt returned demonstration.   Pt ascended/descended 4 8 inch curb steps for home entry with B UE support on L ascending handrail with CGA, verbal cues provided for technique. Therapist assisted on first step, and pt husband returned demonstration for last 3 steps. Pt able to perform 2 steps at a time but required seated rest break for fatigue. Education provided to husband to place 2nd chair on platform to allow for seated rest break. Pt verbalized understanding and agreeable. Recommending pt have 2nd person there for safety initially for management of WC. Pt and husband verbalized understanding and agreeable.   Pt attempted stand pivot transfer to car simulator at height of Zenaida Niece (very high), with pt performing backwards step onto step, however pt did not feel safe. Discussed other cars available. Pt husband reports very low convertible or daughters low level SUV. Therapist recommended picking up pt in low level SUV. Pt performed stand pivot transfer to car simulator at height of SUV with CGA, pt and husband returned demonstration.   Pt seated in WC at end of session with all needs within reach and seatbelt alarm on.        [facility], Herbst, DPT  07/01/2023, 4:31 PM

## 2023-07-01 NOTE — Plan of Care (Signed)
  Problem: RH Balance Goal: LTG: Patient will maintain dynamic sitting balance (OT) Description: LTG:  Patient will maintain dynamic sitting balance with assistance during activities of daily living (OT) Outcome: Completed/Met Goal: LTG Patient will maintain dynamic standing with ADLs (OT) Description: LTG:  Patient will maintain dynamic standing balance with assist during activities of daily living (OT)  Outcome: Completed/Met   Problem: Sit to Stand Goal: LTG:  Patient will perform sit to stand in prep for activites of daily living with assistance level (OT) Description: LTG:  Patient will perform sit to stand in prep for activites of daily living with assistance level (OT) Outcome: Completed/Met   Problem: RH Grooming Goal: LTG Patient will perform grooming w/assist,cues/equip (OT) Description: LTG: Patient will perform grooming with assist, with/without cues using equipment (OT) Outcome: Completed/Met   Problem: RH Dressing Goal: LTG Patient will perform upper body dressing (OT) Description: LTG Patient will perform upper body dressing with assist, with/without cues (OT). Outcome: Completed/Met Goal: LTG Patient will perform lower body dressing w/assist (OT) Description: LTG: Patient will perform lower body dressing with assist, with/without cues in positioning using equipment (OT) Outcome: Completed/Met   Problem: RH Toileting Goal: LTG Patient will perform toileting task (3/3 steps) with assistance level (OT) Description: LTG: Patient will perform toileting task (3/3 steps) with assistance level (OT)  Outcome: Completed/Met   Problem: RH Toilet Transfers Goal: LTG Patient will perform toilet transfers w/assist (OT) Description: LTG: Patient will perform toilet transfers with assist, with/without cues using equipment (OT) Outcome: Completed/Met   Problem: RH Tub/Shower Transfers Goal: LTG Patient will perform tub/shower transfers w/assist (OT) Description: LTG: Patient will  perform tub/shower transfers with assist, with/without cues using equipment (OT) Outcome: Completed/Met

## 2023-07-02 ENCOUNTER — Ambulatory Visit: Payer: Medicare Other | Admitting: Physician Assistant

## 2023-07-02 ENCOUNTER — Other Ambulatory Visit (HOSPITAL_COMMUNITY): Payer: Self-pay

## 2023-07-02 LAB — HEMOGLOBIN AND HEMATOCRIT, BLOOD
HCT: 26.7 % — ABNORMAL LOW (ref 36.0–46.0)
Hemoglobin: 8.7 g/dL — ABNORMAL LOW (ref 12.0–15.0)

## 2023-07-02 LAB — GLUCOSE, CAPILLARY: Glucose-Capillary: 94 mg/dL (ref 70–99)

## 2023-07-02 MED ORDER — XTAMPZA ER 13.5 MG PO C12A: 1.0000 | EXTENDED_RELEASE_CAPSULE | Freq: Two times a day (BID) | ORAL | Status: DC

## 2023-07-02 NOTE — Progress Notes (Incomplete)
Patient ID: Virginia [name], female   DOB: 1956-03-02, 67 y.o.   MRN: [id number]

## 2023-07-02 NOTE — Progress Notes (Signed)
PROGRESS NOTE   Subjective/Complaints: No events overnight. H/H looks good. All questions regarding discharge answered. Husband thinks she is more tired today but on exam she engages well and cognition is unchanged from priors. States swelling has come down quite a bit and breathing has improved.   ROS:  ongoing dizziness - stable, lethargy - stable; swelling-improved,  Pertinent positives per HPI above.  Patient denies fever, rash, sore throat, blurred vision,  nausea, vomiting, diarrhea, cough, shortness of breath or chest pain, joint or back/neck pain, headache, or mood change.     Objective:   No results found. Recent Labs    07/01/23 0554 07/02/23 0730  WBC 7.9  --   HGB 7.8* 8.7*  HCT 24.3* 26.7*  PLT 239  --       Recent Labs    07/01/23 0554  NA 143  K 4.3  CL 106  CO2 27  GLUCOSE 104*  BUN 35*  CREATININE 1.29*  CALCIUM 9.1     Intake/Output Summary (Last 24 hours) at 07/02/2023 0946 Last data filed at 07/01/2023 7829 Gross per 24 hour  Intake 440 ml  Output --  Net 440 ml     Pressure Injury 06/03/23 Buttocks Right Deep Tissue Pressure Injury - Purple or maroon localized area of discolored intact skin or blood-filled blister due to damage of underlying soft tissue from pressure and/or shear. purple (Active)  06/03/23 1740  Location: Buttocks  Location Orientation: Right  Staging: Deep Tissue Pressure Injury - Purple or maroon localized area of discolored intact skin or blood-filled blister due to damage of underlying soft tissue from pressure and/or shear.  Wound Description (Comments): purple  Present on Admission: Yes     Pressure Injury 06/03/23 Buttocks Left Deep Tissue Pressure Injury - Purple or maroon localized area of discolored intact skin or blood-filled blister due to damage of underlying soft tissue from pressure and/or shear. (Active)  06/03/23   Location: Buttocks  Location  Orientation: Left  Staging: Deep Tissue Pressure Injury - Purple or maroon localized area of discolored intact skin or blood-filled blister due to damage of underlying soft tissue from pressure and/or shear.  Wound Description (Comments):   Present on Admission: Yes    Physical Exam: Vital Signs Blood pressure 130/77, pulse 84, temperature 97.6 F (36.4 C), temperature source Oral, resp. rate 18, height 5\' 2"  (1.575 m), weight 73 kg, SpO2 99%.  Constitutional: No distress . Vital signs reviewed.  Laying in bed,  Eyes: Horizontal nystagmus with gaze in all directions -ongoing with EOMI, right-beating. PERRLA Cardiovascular: RRR mild systolic murmur. No JVD  . Edema 1+ at hips, trace in ankles Respiratory/Chest: CTAB.  Nonlabored breathing on room air GI/Abdomen: BS +, non-tender, non-distended Ext: no clubbing, cyanosis, deformity Psych: pleasant and cooperative Skin: C/D/I. No apparent lesions.  Bilateral buttocks DTI much improved        Neurologic exam: alert and attentive Cognition: AAO to person, place, time with some increased time for year/day.  + Mild cognitive, memory deficits -mild delayed recall - ongoing + Mild dysarthria -unchanged Insight: Good insight into current condition.  Strength:  RUE, LLE, and RLE 4/5 throughout  LUE 3+/5 proximally,  4-/5 distally LUE ataxia -ongoing, unchanged from prior exams  Assessment/Plan: 1. Functional deficits which require 3+ hours per day of interdisciplinary therapy in a comprehensive inpatient rehab setting. Physiatrist is providing close team supervision and 24 hour management of active medical problems listed below. Physiatrist and rehab team continue to assess barriers to discharge/monitor patient progress toward functional and medical goals  Care Tool:  Bathing    Body parts bathed by patient: Right arm, Left arm, Chest, Abdomen, Right upper leg, Left upper leg, Face, Left lower leg, Right lower leg, Buttocks, Front  perineal area   Body parts bathed by helper: Left lower leg, Right lower leg, Buttocks, Front perineal area     Bathing assist Assist Level: Contact Guard/Touching assist     Upper Body Dressing/Undressing Upper body dressing   What is the patient wearing?: Pull over shirt    Upper body assist Assist Level: Supervision/Verbal cueing    Lower Body Dressing/Undressing Lower body dressing      What is the patient wearing?: Underwear/pull up, Pants     Lower body assist Assist for lower body dressing: Contact Guard/Touching assist     Toileting Toileting    Toileting assist Assist for toileting: Contact Guard/Touching assist     Transfers Chair/bed transfer  Transfers assist  Chair/bed transfer activity did not occur: Safety/medical concerns  Chair/bed transfer assist level: Contact Guard/Touching assist     Locomotion Ambulation   Ambulation assist      Assist level: Contact Guard/Touching assist Assistive device: Walker-rolling Max distance: 40   Walk 10 feet activity   Assist  Walk 10 feet activity did not occur: Safety/medical concerns  Assist level: Contact Guard/Touching assist Assistive device: Walker-rolling   Walk 50 feet activity   Assist Walk 50 feet with 2 turns activity did not occur: Safety/medical concerns  Assist level: Minimal Assistance - Patient > 75% Assistive device: Walker-rolling    Walk 150 feet activity   Assist Walk 150 feet activity did not occur: Safety/medical concerns         Walk 10 feet on uneven surface  activity   Assist Walk 10 feet on uneven surfaces activity did not occur: Safety/medical concerns         Wheelchair     Assist Is the patient using a wheelchair?: Yes Type of Wheelchair: Manual    Wheelchair assist level: Dependent - Patient 0%      Wheelchair 50 feet with 2 turns activity    Assist        Assist Level: Dependent - Patient 0%   Wheelchair 150 feet activity      Assist      Assist Level: Dependent - Patient 0%   Blood pressure 130/77, pulse 84, temperature 97.6 F (36.4 C), temperature source Oral, resp. rate 18, height 5\' 2"  (1.575 m), weight 73 kg, SpO2 99%.   Medical Problem List and Plan: 1. Functional deficits secondary to debility after cardiac arrest and multiple related complications             -patient may shower             -ELOS/Goals: 12-14 days, min assist with PT, OT (now CGA/SPV goals), SLP - discharge date 10/22             -pt has episodes of fatigue/somnolence at baseline per husband. He says she does better in the later morning and early afternoon.   -Continue CIR therapies including PT, OT, and SLP    -  10/15: walking up to 68 feet; working on cognitive engagement; 13 step goal per family not realistic, has 1st floor setup. D/w family purchasing Roho to prevent skin breakdown.    - 10/10: Reached out to palliative, requesting they touch base with patient and family again prior to discharge 10-22 regarding lingering questions on caregiver role and end-of-life  10/12: Worsening overall dizziness/vertigo and ataxia, progressive over last 2 days; patient opts to wait 1 more day for repeat imaging to evaluate -self reports symptoms improved 10-13; ongoing   - 10/21: Discussed with husband regarding patient changing DNR status, asking about anticipated return to function if another MI. Stated that, given her multiple medical co-morbidities, would anticipate another major even would result in further physical decline although extent cannot be anticipated. Will discuss with full family in AM.  The patient is medically ready for discharge to home and will need follow-up with [facility] PM&R. In addition, they will need to follow up with their PCP,  oncology, neuro-oncology and cardiology.    2.  Antithrombotics: -DVT/anticoagulation:  Mechanical:  Antiembolism stockings, knee (TED hose) Bilateral lower extremities (age indeterminate left  posterior tib DVT). 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             -antiplatelet therapy: none  -10-17: Reinforced use of TED hose when out of bed for increasing edema - compliant 10/21!   3. Pain Management: Tylenol, Norco, oxycodone as needed -Lidoderm patch             -Oxycontin 10 mg q 12 hours             -observe for further sedation. Was somnolent this morning after receiving pain medication.   -10-3: Continue OxyContin 10 mg every 12 hours (is on 13.5 mg every 12 at home) just short acting to home regimen oxycodone 5 mg every 4 hours as needed     - 10/8: Add PRN methocarbamol for muscle soreness -  not used    10/5-6 pain appears controlled.  4. Mood/Behavior/Sleep: LCSW to evaluate and provide emotional support             -antipsychotic agents: Zyprexa 2.5 mg q HS-at home dose             -Xanax prn -at home dose  -10/14 as needed melatonin 3 mg - improved   5. Neuropsych/cognition: This patient is capable of making decisions on his own behalf.    - Cognition chronically altered d/t intracranial disease, now at baseline with daily waxing/waning; did discuss with husband this is expected  6. Skin/Wound Care: Routine skin care checks   -DTI much improved    7. Fluids/Electrolytes/Nutrition: Routine Is and Os and follow-up chemistries   -10-1: See below, potassium borderline, magnesium repleted today.  Rechecking in AM.   - 10-2: Electrolytes repleted, labs normal, monitor  - 10/4: Encourage p.o. fluids due to tachycardia today; BMP in a.m.  -10/6 Potassium 3.2--increase supplement  -BUN/Cr holding fairly steady--continue to push fluids -10-7: Potassium 3.8.  Continue current supplement.  BUN/creatinine stable.  - labs in AM for BMP, Mag given soreness -stable 1/9; AKI 10-12; repeat Monday -10/14 Potassium stable at 4.2 -10-17: CMP, magnesium in a.m.; concerned  with albumin, anasarca contributing to poor edema management 10-18: See volume overload treatment below.  AKI today, may improve with diuresis, encourage patient that she still  needs to drink some fluid even with diuresis and repeat BMP, mag in AM. 10/19 recheck magnesium tomorrow - 1.9, stable 10/21    8: Hypertension: monitor TID and prn (home Norvasc held)   - Remains normotensive, monitor with addition of spironolactone/ BMP  - change to 10/11 given symptoms   - 10/12: BMP with AKI; increase in BP may be from steroids vs. Contracting; reduce spirinolactone to 12.5 mg daily and repeat labs Monday  10-17: Tolerated dose of Toprol.   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  Will monitor for any functional decline, and may reach out to neuro-oncology for prolonged taper depending on symptom severity.  10/10: Finished Decadron taper, discussed with family notifying provider if any worsening symptoms of dizziness, ataxia, or lethargy with this.  If needed to resume, will reach out to Dr. Barbaraann Cao --> resumed 1 mg daily until OP f/u   10: Bilateral rib fractures due to chest compressions -pain control as above   11: V-fib cardiac arrest:  -continue Pacerone 400 mg daily for one week, then 200 mg daily on 10/08--Dropped off 10/15 ? transition, resumed 10/15, will touch base with cardiology regarding plans for transition to BB -> start toprol xl 25 mg 10/17 AM - tolerating 10/22: Patient and husband inquiring about being uncertain of DNR vs full code; advised given other medical co-morbidities recurrent  cardiac arrest caries both significant chance of death and would anticipate decreased quality of life once recovered. Family to follow up with Oncology for palliative services PRN.               -not a candidate for invasive intervention             -K > 4; Mg > 2 -potassium 3.8 on admission labs, recheck in AM.  Magnesium 1.6, replete 2 g IV today and then start 400 mg twice daily due to recurrent drops.  Recheck in AM.             -if patient needs diuretics outpatient due to lower extremity edema then cardiology will consider changing to a potassium sparing diuretic such as spironolactone 25 mg daily along with some potassium supplementation and magnesium             -follow-up with Dr. Lynnette Caffey 3 weeks after D/C from CIR    - Daily weights   -10/6 supplement K+  -10-9: Reminding nursing to perform daily weights.will have cards eval today to initiate diuretic and adjust meds given ongoing swelling and mild hypotension  10/10: Cardiology resumed spironolactone 25 mg daily due to peripheral edema, appears to be helping.     10/12: reduce to 12.5 mg daily d/t AKI  10/15: D/w nursing re: resuming daily weights. May need to increase diuretics again d/t swelling; monitorring closely.   10-17: Weights remain downtrending/stable.,  Increased peripheral edema on exam.  Unsure if due to poor nutritional status or truly volume overloaded.  CMP in AM.  Encouraged TED hose.  10/18: Up  5 kg today? Cr increased; gross signs of volume overload on exam.  Chest x-ray read pending, but showing increased central vascular prominence.  No apparent effusions.  20 mg IV Lasix today, then increase spironolactone to 25 mg daily starting tomorrow a.m.  Per cardiology, may need loop diuretic added as outpatient; would have low threshold to consult them for medication adjustments if no improvement  10/21: Down to 73 kg; improved symtpoms; Cr stable - monitor  Filed Weights   06/28/23 0623 06/30/23 0500 07/01/23 0500   Weight: 75.4 kg 75.7 kg 73 kg     12: Stage IV lung cancer/mets to brain:             -continue Keppra 1000 mg BID             -follow-up with Dr. 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 fluids, labs stable, f/u monday  10-7: Creatinine stabilizing at 1.1-1.2; unchange d10-9;  -10-18: Creatinine increasing, 1.4 today, with gross signs of volume overload.  May be cardiorenal, repeat labs in a.m. and encourage patient to drink some fluid today as intakes have been minimal. 10/19 creatinine improved to 1.32, BUN 37 today.  Drinking better -10/21: Labs stable; Cr 1.29  17: Anemia: ~3 gram drop in hemoglobin/no overt bleeding             -follow-up CBC. Most recently has been ~7.7; stable on admission labs             -check Hemoccult X 3 -pending             -has hemorrhoids, constipated             -continue PPI, Pepcid   - hgb stable at 7.8 on 10/5; greater than 8 10/7  -10/14 Hgb stable at 8.3  -10/19 Hgb stable at 8.5  -10/21: Hgb 7.8; add iron studies  - ferritin elevated as expected d/t illness, other parameters WNL. Repeat h/h in AM. -- stable 8 10/22   18: DM: A1c = 7.3%, also on Decadron             -start CBGs QID and SSI             -carb modified diet as tolerated  Blood sugars well-controlled, monitor  -10-3: Mild elevation blood sugars over the last few days, likely with  improved diet; not on any diabetic regimen as outpatient.  Monitor 1 day and consider initiating low-dose metformin if no improvement.   - 10-4: Patient working on dietary adjustments, mildly improved.  Continue to monitor Recent Labs    07/01/23 1700 07/01/23 2042 07/02/23 0531  GLUCAP 172* 121* 94    10/6 sugars have been elevated  due to steroids--decadron down to 1mg  daily and readings improved except for readings around dinner time   -continue current cbg checks and SSI for now   -may be able to stop these soon  10/14-21CBGs well-controlled continue current regimen; elevated d/t steroids  -CBGs well-controlled    19: Prior post fossa craniotomy for tumor resection 12/02/2020: resection of progressive cerebellar mass by Dr. Venetia Maxon. 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 Has chronic intermittent dizziness>>meclizine prn-is well-controlled   - well controlled, no current complaints  -10/5 scheduled meclizine 25mg  tid per pt request -> increase to 50 mg TID 10/11; this was OP dose  - Had prior rxn to scopalamine  - 10/11: Symptoms worsening s/p steroid taper, messaged Dr. Barbaraann Cao regarding resuming 1 mg daily until OP follow up --> he is in agreement with this, resumed 10/11   - 10/12: Symptoms worsening overall; see above, if continuing tomorrow will re-image    - 10/13: Improved, defer further imaging to neuro-oncology/oncology OP   20: External hemorrhoids: continue topical hydrocortisone 2.5%; reordered today, as suppository was prior ordered.-Was well-controlled   21: Intermittent constipation: check for impaction then decide on appropriate regimen-resolved  10-1: Multiple small bowel movements overnight, add MiraLAX 17 mg daily  10-2: Small brown bowel movement this a.m., mushy consistency - improved  Last bowel movement 10/4--hopeful for BM today 10/6  Small bowel movement 10-7, increase MiraLAX to twice daily and add as needed Senokot per family request  10-22 last BM    22.   Malaise.  -Improved today 10/19.  She had chest x-ray indicating atelectasis versus pneumonia.  She was given IV Lasix yesterday.  Follow-up x-ray PA and lateral chest in 3 to 4 weeks recommended.  O2 sat 98% on room air.  No leukocytosis.  No fevers.  Continue to monitor  -10/20 continue to be improved, recheck labs tomorrow - labs stable    LOS: 22 days A FACE TO FACE EVALUATION WAS PERFORMED  Angelina Sheriff 07/02/2023, 9:46 AM

## 2023-07-02 NOTE — Plan of Care (Signed)
  Problem: RH Ambulation Goal: LTG Patient will ambulate in controlled environment (PT) Description: LTG: Patient will ambulate in a controlled environment, # of feet with assistance (PT). Outcome: Not Met (add Reason) Note: Pt demonstrates ongoing endurance deficits and inconsistent performance related to anxiety and dizziness Goal: LTG Patient will ambulate in home environment (PT) Description: LTG: Patient will ambulate in home environment, # of feet with assistance (PT). Outcome: Not Met (add Reason) Note: Pt demonstrates ongoing endurance deficits and inconsistent performance related to anxiety and dizziness   Problem: RH Balance Goal: LTG Patient will maintain dynamic sitting balance (PT) Description: LTG:  Patient will maintain dynamic sitting balance with assistance during mobility activities (PT) Outcome: Completed/Met Goal: LTG Patient will maintain dynamic standing balance (PT) Description: LTG:  Patient will maintain dynamic standing balance with assistance during mobility activities (PT) Outcome: Completed/Met   Problem: Sit to Stand Goal: LTG:  Patient will perform sit to stand with assistance level (PT) Description: LTG:  Patient will perform sit to stand with assistance level (PT) Outcome: Completed/Met   Problem: RH Bed Mobility Goal: LTG Patient will perform bed mobility with assist (PT) Description: LTG: Patient will perform bed mobility with assistance, with/without cues (PT). Outcome: Completed/Met   Problem: RH Bed to Chair Transfers Goal: LTG Patient will perform bed/chair transfers w/assist (PT) Description: LTG: Patient will perform bed to chair transfers with assistance (PT). Outcome: Completed/Met   Problem: RH Car Transfers Goal: LTG Patient will perform car transfers with assist (PT) Description: LTG: Patient will perform car transfers with assistance (PT). Outcome: Completed/Met   Problem: RH Stairs Goal: LTG Patient will ambulate up and down  stairs w/assist (PT) Description: LTG: Patient will ambulate up and down # of stairs with assistance (PT) Outcome: Completed/Met

## 2023-07-02 NOTE — Progress Notes (Signed)
Inpatient Rehabilitation Discharge Medication Review by a Pharmacist   A complete drug regimen review was completed for this patient to identify any potential clinically significant medication issues.   High Risk Drug Classes Is patient taking? Indication by Medication  Antipsychotic Yes Olanzapine - mood  Anticoagulant No    Antibiotic No    Opioid Yes Xtampza and prn Oxy - pain   Antiplatelet No    Hypoglycemics/insulin No    Vasoactive Medication Yes Spironolactone, Metoprolol -  HTN  Chemotherapy No    Other Yes Keppra - seizure ppx Mg Oxide, Vit D, Vit B12, Kcl, MVI - supplement Alprazolam - PRN anxiety Dexamethasone -  Post-arrest ALI  Hydrocortisone cream - PRN hemorrhoids Meclizine - vertigo ondansetron - nausea/vomiting PRN Albuterol - SOB Pantoprazole - GERD         Type of Medication Issue Identified Description of Issue Recommendation(s)  Drug Interaction(s) (clinically significant)        Duplicate Therapy        Allergy        No Medication Administration End Date        Incorrect Dose        Additional Drug Therapy Needed        Significant med changes from prior encounter (inform family/care partners about these prior to discharge). Amlodipine, Lasix, FA stopped at DC Communicate medication changes with patient/family at discharge  Other            Clinically significant medication issues were identified that warrant physician communication and completion of prescribed/recommended actions by midnight of the next day:  No   Pharmacist comments: n/a   Time spent performing this drug regimen review (minutes): 20  Jeanella Cara, PharmD, [place] Clinical Pharmacist Please see AMION for all Pharmacists' Contact Phone Numbers 07/02/2023, 10:56 AM

## 2023-07-03 NOTE — Progress Notes (Signed)
Inpatient Rehabilitation Care Coordinator Discharge Note   Patient Details  Name: Virginia [name] MRN: [id number] Date of Birth: [birth year]   Discharge location: D/c to home with support from husband  Length of Stay: 21 days  Discharge activity level: Min Asst  Home/community participation: limited  Patient response WU:[id number] Literacy - How often do you need to have someone help you when you read instructions, pamphlets, or other written material from your doctor or pharmacy?: Sometimes  Patient response YN:WGNFAO Isolation - How often do you feel lonely or isolated from those around you?: Never  Services provided included: MD, PT, SLP, RN, CM, Pharmacy, Neuropsych, SW, TR, OT, RD  Financial Services:  Financial Services Utilized: Medicare    Choices offered to/list presented to: patient husband  Follow-up services arranged:  Home Health Home Health Agency: [facility] HHPT/OT/SLP/aide         Patient response to transportation need: Is the patient able to respond to transportation needs?: Yes In the past 12 months, has lack of transportation kept you from medical appointments or from getting medications?: No In the past 12 months, has lack of transportation kept you from meetings, work, or from getting things needed for daily living?: No   Patient/Family verbalized understanding of follow-up arrangements:  Yes  Individual responsible for coordination of the follow-up plan: contact pt husband Harvie Heck [phone]  Confirmed correct DME delivered: Gretchen Short 07/03/2023    Comments (or additional information):fam edu completed  Summary of Stay    Date/Time Discharge Planning CSW  06/24/23 1519 D/c plan remains for pt to discharge to home with husband who was assisting prior to admission. Has been staying here with her since admission. Pt is active with [facility]. SW will confirm there are no barriers to discharge. AAC  06/17/23 1410 D/c plan is for pt  to discharge to home with husband who was assisting prior to admission. Has been staying here with her since admission. SW will confirm there are no barriers to discharge. AAC  06/11/23 0935 Nee evaluation today-home with husband who was assisting prior to admission. Has been staying here with her since admission RGD       Rylee Nuzum A Lula Olszewski

## 2023-07-03 NOTE — Progress Notes (Signed)
Recreational Therapy Discharge Summary Patient Details  Name: Virginia [name] MRN: [id number] Date of Birth: 1956-06-03 Today's Date: 07/03/2023  Comments on progress toward goals: Pt has made good progress during LOS and discharged home with husband/family to provide the needed supervision/assistance.  TR sessions focused on pt education in regards to social, emotional, spiritual health in addition to physical health and their effects on each other and overall wellness.  Discussed coping strategies with an emphasis on importance of leisure, activity analysis with identification of potential modifications.  Pt also participated in therapeutic dance groups during LOS for overall activity tolerance, UE/LE strengthening, socialiation.  Reasons for discharge: discharge from hospital  Follow-up: Outpatient  Patient/family agrees with progress made and goals achieved: Yes  [name] 07/03/2023, 9:06 AM

## 2023-07-04 ENCOUNTER — Telehealth: Payer: Self-pay | Admitting: Internal Medicine

## 2023-07-04 ENCOUNTER — Encounter: Payer: Self-pay | Admitting: Hematology and Oncology

## 2023-07-04 NOTE — Telephone Encounter (Signed)
Called patient regarding December appointments, spoke with patient's spouse. Patient will be notified.

## 2023-07-05 ENCOUNTER — Other Ambulatory Visit (HOSPITAL_COMMUNITY): Payer: Self-pay

## 2023-07-05 ENCOUNTER — Other Ambulatory Visit: Payer: Self-pay | Admitting: Hematology and Oncology

## 2023-07-05 ENCOUNTER — Telehealth: Payer: Self-pay | Admitting: *Deleted

## 2023-07-05 MED ORDER — OXYCODONE HCL 5 MG PO TABS: 5.0000 mg | ORAL_TABLET | ORAL | 0 refills | Status: DC | PRN

## 2023-07-05 MED ORDER — XTAMPZA ER 13.5 MG PO C12A: 1.0000 | EXTENDED_RELEASE_CAPSULE | Freq: Two times a day (BID) | ORAL | Status: DC

## 2023-07-05 NOTE — Progress Notes (Signed)
Patient was admitted on 06/10/23 with what was charted as a DTI to bilateral buttocks. A picture was taken on admission that showed 2 wounds, not one, but both wounds were included into one LDA. One was on the left buttock & one was one was on the right buttock. I attempted to correct this when it was noted by placing a new LDA & relabeling the other one to read right & left buttock & allow staff to document on each one as appropriate. Both wounds were charted to have been acquired on 06/03/23 at 1821.They were not acquired on CIR.

## 2023-07-05 NOTE — Telephone Encounter (Signed)
Received vm message from pt's husband, Virginia [name]. He states that  Virginia [name] is very fatigued and is having cognition problems-off and on. More on than off. He is asking if it is related to her low HGB of 8.7 He states she was transfused and received IV fluids a month ago and he noticed an improvement in her fatigue and cognition.  HGB of 8.7 was done on 07/02/23  Ferritin 458 and Iron Sat was 19.  TCT Virginia [name] and spoke with him.  Advised that Dr. [name] does not recommend a transfusion at her current HGB of 8.7  Encouraged eating well-as best she can and to drink at least 6-8 glasses of fluids (non-caffeinated) per day.  Virginia [name] states that she is eating fair but not drinking as much fluids as she should. He will encourage her to drink more fluids. Virginia [name] was discharged from Rehab on 07/02/23, so she has ben home only 3 days at this time. They plan to come to her next appt here on 07/19/23. Virginia [name] is aware of her brain MRI and Dr. Barbaraann Cao appt in December. Advised to call anytime with questions or concerns

## 2023-07-08 ENCOUNTER — Telehealth (INDEPENDENT_AMBULATORY_CARE_PROVIDER_SITE_OTHER): Payer: Medicare Other | Admitting: Internal Medicine

## 2023-07-08 ENCOUNTER — Telehealth: Payer: Self-pay

## 2023-07-08 DIAGNOSIS — R5381 Other malaise: Secondary | ICD-10-CM | POA: Diagnosis not present

## 2023-07-08 DIAGNOSIS — Z8674 Personal history of sudden cardiac arrest: Secondary | ICD-10-CM

## 2023-07-08 DIAGNOSIS — Z87891 Personal history of nicotine dependence: Secondary | ICD-10-CM

## 2023-07-08 DIAGNOSIS — E7439 Other disorders of intestinal carbohydrate absorption: Secondary | ICD-10-CM

## 2023-07-08 DIAGNOSIS — K219 Gastro-esophageal reflux disease without esophagitis: Secondary | ICD-10-CM

## 2023-07-08 DIAGNOSIS — I468 Cardiac arrest due to other underlying condition: Secondary | ICD-10-CM

## 2023-07-08 DIAGNOSIS — C7801 Secondary malignant neoplasm of right lung: Secondary | ICD-10-CM | POA: Diagnosis not present

## 2023-07-08 NOTE — Progress Notes (Signed)
Patient Care Team: Virginia Mackintosh, MD as PCP - General (Internal Medicine) Virginia Standard, MD as PCP - Hematology/Oncology (Hematology and Oncology) Virginia Overman, RN as Oncology Nurse Navigator Virginia Petrin, DO as Consulting Physician [facility] and Palliative Medicine) [facility], Dory Peru, MD as Rounding Team (Cardiology)  I connected with Virginia [name] on 07/08/23 at 5:55 PM by video enabled telemedicine visit and verified that I am speaking with the correct person using two identifiers.   I discussed the limitations, risks, security and privacy concerns of performing an evaluation and management service by telemedicine and the availability of in-person appointments. I also discussed with the patient that there may be a patient responsible charge related to this service. The patient expressed understanding and agreed to proceed.   Other persons participating in the visit and their role in the encounter: Medical scribe, Virginia [name]  Patient's location: Home  Provider's location: Clinic   I provided 30 minutes of face-to-face video visit time during this encounter, and > 50% was spent counseling as documented under my assessment & plan. She is identified using two identifiers, Virginia [name], a patient of this practice. She is in her home and I am in my practice. She is agreeable to using this format today.  Chief Complaint: hospital/ED follow-up.   Subjective:    Patient ID: Virginia [name] , Female    DOB: May 26, 1956, 67 y.o.    MRN: [id number]   67 y.o. Female presents today for a hospital/ED follow-up. 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 Leonides [name] is her Oncologist.  Arrived in emergency department with CPR in progress for V-fib arrest September 2024.  Successfully resuscitated.  Went to rehab September 30 and was discharged home July 02, 2023  Due to  her recent hospitalization and debility, she is seen today via interactive audio and video telecommunications.  She is at her home and I am at my office.  Her husband accompanies her today on this visit.  She is agreeable to visit in this format today.   She had a witnessed out-of-hospital cardiac arrest lasting 20+ minutes on 06/03/23 and received 5 shocks via EMS for reported VF and two rounds of epinephrine. Arrest was preceded by possible seizure. Admitted to hospital from 06/03/23 - 06/10/23. TTE notes diminished EF at 45-50% with global hypokinesis. Post-arrest acute aspiration pneumonitis noted. 06/03/23 CT chest showed multiple bilateral rib fractures.  06/05/23 duplex showed age-indeterminate DVT left posterior tibial veins. Status post craniotomy, resection of progressive cerebellar mass by Dr. Venetia [name] on 12/02/2020. She is on amiodarone, spironolactone.  She was admitted to inpatient rehab from 06/10/23 - 07/02/23.   She is feeling better today although she has not been able to walk unassisted. She is on long-acting oxycodone 13.5 mg twice daily and oxycodone 5 mg every 4 hours as needed. She has some pain in her rib cage posteriorly. Appetite normal. She is on Keppra, Decadron 1 mg daily. Has alprazolam, Zofran as needed. She is on Zyprexa for sleep. She has Senokot for constipation. Has meclizine as needed for dizziness.  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  They reside in [place].  Adult children, 2 daughters and a son.     Review of Systems  Constitutional:  Negative for fever and malaise/fatigue.  HENT:  Negative for congestion.   Eyes:  Negative for blurred vision.  Respiratory:  Negative for cough and shortness of breath.   Cardiovascular:  Negative for chest pain, palpitations and leg swelling.  Gastrointestinal:  Negative for vomiting.  Musculoskeletal:  Negative for back pain.  Skin:  Negative for rash.  Neurological:  Negative for loss of consciousness and headaches.        Objective:   Vitals: There were no vitals taken for this visit.   Physical Exam Vitals and nursing note reviewed.  Constitutional:      General: She is not in acute distress.    Appearance: Normal appearance. She is not toxic-appearing.  HENT:     Head: Normocephalic and atraumatic.  Pulmonary:     Effort: Pulmonary effort is normal.  Skin:    General: Skin is warm and dry.  Neurological:     Mental Status: She is alert and oriented to person, place, and time. Mental status is at baseline.  Psychiatric:        Mood and Affect: Mood normal.        Behavior: Behavior normal.        Thought Content: Thought content normal.        Judgment: Judgment normal.       Results:   Studies obtained and personally reviewed by me:  06/03/23 CT chest showed multiple bilateral rib fractures.   06/05/23 duplex showed age-indeterminate DVT left posterior tibial veins.   Labs:       Component Value Date/Time   NA 143 07/01/2023 0554   K 4.3 07/01/2023 0554   CL 106 07/01/2023 0554   CO2 27 07/01/2023 0554   GLUCOSE 104 (H) 07/01/2023 0554   BUN 35 (H) 07/01/2023 0554   CREATININE 1.29 (H) 07/01/2023 0554   CREATININE 1.47 (H) 05/17/2023 1119   CREATININE 0.58 11/15/2017 0948   CALCIUM 9.1 07/01/2023 0554   PROT 5.6 (L) 06/28/2023 0756   ALBUMIN 2.5 (L)  06/28/2023 0756   AST 16 06/28/2023 0756   AST 14 (L) 05/17/2023 1119   ALT 16 06/28/2023 0756   ALT 18 05/17/2023 1119   ALKPHOS 92 06/28/2023 0756   BILITOT 0.5 06/28/2023 0756   BILITOT 0.3 05/17/2023 1119   GFRNONAA 46 (L) 07/01/2023 0554   GFRNONAA 39 (L) 05/17/2023 1119   GFRNONAA 100 11/15/2017 0948   GFRAA >60 06/09/2020 1051   GFRAA 116 11/15/2017 0948     Lab Results  Component Value Date   WBC 7.9 07/01/2023   HGB 8.7 (L) 07/02/2023   HCT 26.7 (L) 07/02/2023   MCV 106.6 (H) 07/01/2023   PLT 239 07/01/2023    Lab Results  Component Value Date   CHOL 235 (H) 01/12/2019   HDL 92 01/12/2019  LDLCALC 117 (H) 01/12/2019   TRIG 134 06/05/2023   CHOLHDL 2.6 01/12/2019    Lab Results  Component Value Date   HGBA1C 7.3 (H) 06/03/2023     Lab Results  Component Value Date   TSH 1.690 06/08/2023      Assessment & Plan:   Cardiac arrest:  Had v-fib arrest at home and survived. Admitted to hospital Sept 23- remarkable recovery. Transferred to Rehab unit Sept 30 after ICU stay. Her husband believes she has improved since discharge. Agree   with Home Health services. Hospice to become involved at patient's discretion. We have signed home health orders today. Seems to have some mild cognitive deficits since discharge on virtual visit but overall remarkable recovery. Family glad to have her back home.  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 Continues follow up with Oncology, Dr. Jeanie Sewer.  Patient was diagnosed in 2021 after presenting to the ED with a fall downstairs.  CT of the head showed concern for metastatic disease involving left cerebellum and right occipital lobe.  This was confirmed by MRI of the brain as well as at least 8 metastatic lesions with in the supratentorial and infratentorial brain.  PET scan showed hyper metabolic infrahilar right lower lobe mass with hypermetabolic nodal metastases and right  hilum, mediastinum, right supraclavicular region, left axilla and lower left neck.  Ultrasound-guided biopsy January 2021 confirmed poorly differentiated non-small cell carcinoma.  Has received radiation therapy in addition to chemotherapy.  Has been treated with Bevacizumab  Impaired glucose tolerance- monitor accuchecks at home due to Decadron therapy  Hx of seizure treated with Keppra.  Currently on Decadron.  Generalized weakness and debility. Home PT has been ordered  GE reflux treated with Protonix  Plan: Have signed orders for home health services. Husband and family are supportive, He prefers to have her at home.  I,Alexander Ruley,acting as a Neurosurgeon for Virginia Mackintosh, MD.,have documented all relevant documentation on the behalf of Virginia Mackintosh, MD,as directed by  Virginia Mackintosh, MD while in the presence of Virginia Mackintosh, MD.   I, Virginia Mackintosh, MD, have reviewed all documentation for this visit. The documentation on 07/08/23 for the exam, diagnosis, procedures, and orders are all accurate and complete.

## 2023-07-08 NOTE — Telephone Encounter (Addendum)
Transition Care Management Follow-up Telephone Call Date of discharge and from where: 07/02/23 [facility]  How have you been since you were released from the hospital? Not walking  Any questions or concerns? No   Items Reviewed: Did the pt receive and understand the discharge instructions provided?Yes Medications obtained and verified? yes Other?  Any new allergies since your discharge? NO  Dietary orders reviewed? YES Do you have support at home? YES    Home Care and Equipment/Supplies: Were home health services ordered? yes OT,pt If so, what is the name of the agency?   [facility] Has the agency set up a time to come to the patient's home? Yes.  Were any new equipment or medical supplies ordered? Hospital bed  What is the name of the medical supply agency? n/a   Were you able to get the supplies/equipment? Yes  Do you have any questions related to the use of the equipment or supplies? No   Functional Questionnaire: (I = Independent and D = Dependent) ADLs: D   Bathing/Dressing- D   Meal Prep- D   Eating-  D  Maintaining continence- I    Transferring/Ambulation- D   Managing Meds- D   Follow up appointments reviewed:   PCP Hospital f/u appt confirmed? 07/08/23 4:00PM [facility] Hospital f/u appt confirmed? YES  Are transportation arrangements needed? YES  If their condition worsens, is the pt aware to call PCP or go to the Emergency Dept.? YES  Was the patient provided with contact information for the PCP's office or ED? YES  Was to pt encouraged to call back with questions or concerns? YES

## 2023-07-09 ENCOUNTER — Telehealth: Payer: Self-pay | Admitting: Internal Medicine

## 2023-07-09 NOTE — Telephone Encounter (Signed)
This message was sent via FAXCOM, a product from [employer]. [email or web address].biscom.com/                    -------Fax Transmission Report-------  To:               Recipient at [phone] Subject:          Fw: Hp Scans Result:           The transmission was successful. Explanation:      All Pages Ok Pages Sent:       5 Connect Time:     2 minutes, 43 seconds Transmit Time:    07/09/2023 09:59 Transfer Rate:    14400 Status Code:      0000 Retry Count:      0 Job Id:           [id number] Unique Id:        [id number] Fax Line:         5 Fax Server:       [employer]

## 2023-07-09 NOTE — Telephone Encounter (Signed)
This message was sent via FAXCOM, a product from [employer]. [email or web address].biscom.com/                    -------Fax Transmission Report-------  To:               Recipient at [phone] Subject:          Fw: Hp Scans Result:           The transmission was successful. Explanation:      All Pages Ok Pages Sent:       5 Connect Time:     2 minutes, 56 seconds Transmit Time:    07/09/2023 10:00 Transfer Rate:    14400 Status Code:      0000 Retry Count:      0 Job Id:           [id number] Unique Id:        [id number] Fax Line:         49 Fax Server:       [id number]

## 2023-07-09 NOTE — Telephone Encounter (Signed)
Faxed back signed OT orders to [phone]   -------Fax Transmission Report-------  To:               Recipient at [phone] Subject:          Fw: Hp Scans Result:           The transmission was successful. Explanation:      All Pages Ok Pages Sent:       3 Connect Time:     1 minutes, 26 seconds Transmit Time:    07/09/2023 09:58 Transfer Rate:    14400 Status Code:      0000 Retry Count:      0 Job Id:           [id number] Unique Id:        [id number] Fax Line:         2 Fax Server:       [employer]

## 2023-07-10 ENCOUNTER — Encounter: Payer: Self-pay | Admitting: Internal Medicine

## 2023-07-10 ENCOUNTER — Telehealth: Payer: Self-pay | Admitting: Hematology and Oncology

## 2023-07-10 ENCOUNTER — Other Ambulatory Visit: Payer: Self-pay | Admitting: Hematology and Oncology

## 2023-07-10 MED ORDER — OXYCODONE HCL 5 MG PO TABS: 5.0000 mg | ORAL_TABLET | ORAL | 0 refills | Status: DC | PRN

## 2023-07-10 MED ORDER — XTAMPZA ER 13.5 MG PO C12A: 1.0000 | EXTENDED_RELEASE_CAPSULE | Freq: Two times a day (BID) | ORAL | Status: DC

## 2023-07-10 NOTE — Patient Instructions (Signed)
It was a pleasure to see you by video visit today.  Agree with current plans and treatment.  We are pleased you are feeling better.  Dr. [name] will remain had of your treatment team.  We are here if you need [name].  Home health orders will be signed.

## 2023-07-11 ENCOUNTER — Telehealth: Payer: Self-pay | Admitting: Internal Medicine

## 2023-07-11 NOTE — Telephone Encounter (Signed)
Received updated physical therapy orders to sign and fax back. [facility] [phone] Phone [phone] Linford Arnold, Physical Therapist  Pt is delayed, want to increase PT services to see if it helps

## 2023-07-11 NOTE — Telephone Encounter (Signed)
Received OT orders to sign and fax back to [facility] [phone] Phone [phone]  [facility] Occupation Therapist  [phone]

## 2023-07-11 NOTE — Telephone Encounter (Signed)
-------  Fax Transmission Report-------  To:               Recipient at [phone] Subject:          Fw: Hp Scans Result:           The transmission was successful. Explanation:      All Pages Ok Pages Sent:       3 Connect Time:     1 minutes, 39 seconds Transmit Time:    07/10/2023 13:19 Transfer Rate:    14400 Status Code:      0000 Retry Count:      0 Job Id:           [id number] Unique Id:        [id number] Fax Line:         3 Fax Server:       [employer]

## 2023-07-11 NOTE — Telephone Encounter (Signed)
-------  Fax Transmission Report-------  To:               Recipient at [phone] Subject:          Fw: Hp Scans Result:           The transmission was successful. Explanation:      All Pages Ok Pages Sent:       3 Connect Time:     1 minutes, 25 seconds Transmit Time:    07/11/2023 09:32 Transfer Rate:    14400 Status Code:      0000 Retry Count:      0 Job Id:           [id number] Unique Id:        [id number] Fax Line:         36 Fax Server:       [id number]

## 2023-07-12 ENCOUNTER — Other Ambulatory Visit: Payer: Self-pay | Admitting: Hematology and Oncology

## 2023-07-12 ENCOUNTER — Other Ambulatory Visit (HOSPITAL_COMMUNITY): Payer: Self-pay

## 2023-07-12 ENCOUNTER — Telehealth: Payer: Self-pay | Admitting: Internal Medicine

## 2023-07-12 MED ORDER — XTAMPZA ER 13.5 MG PO C12A: 1.0000 | EXTENDED_RELEASE_CAPSULE | Freq: Two times a day (BID) | ORAL | Status: DC

## 2023-07-12 MED ORDER — XTAMPZA ER 13.5 MG PO C12A: 1.0000 | EXTENDED_RELEASE_CAPSULE | Freq: Two times a day (BID) | ORAL | 0 refills | Status: DC

## 2023-07-12 NOTE — Telephone Encounter (Signed)
Received Resumption of Care orders to be signed after patient come home from hospital and rehab. From [facility] Fax [phone] Phone [phone]

## 2023-07-12 NOTE — Telephone Encounter (Signed)
-------  Fax Transmission Report-------  To:               Recipient at [phone] Subject:          Fw: Hp Scans Result:           The transmission was successful. Explanation:      All Pages Ok Pages Sent:       8 Connect Time:     5 minutes, 11 seconds Transmit Time:    07/12/2023 15:12 Transfer Rate:    14400 Status Code:      0000 Retry Count:      1 Job Id:           [id number] Unique Id:        [id number] Fax Line:         50 Fax Server:       [employer]

## 2023-07-12 NOTE — Telephone Encounter (Signed)
-------  Fax Transmission Report-------  To:               Recipient at [phone] Subject:          Fw: Hp Scans Result:           The transmission was successful. Explanation:      All Pages Ok Pages Sent:       8 Connect Time:     4 minutes, 45 seconds Transmit Time:    07/12/2023 15:08 Transfer Rate:    14400 Status Code:      0000 Retry Count:      0 Job Id:           [id number] Unique Id:        [id number] Fax Line:         58 Fax Server:       [id number]

## 2023-07-14 NOTE — Progress Notes (Unsigned)
Cardiology Office Note:  .   Date:  07/16/2023  ID:  Virginia [name], DOB Jan 12, 1956, MRN [id number] PCP: [name], MD  [facility] Providers Cardiologist:  Dr.Thukkani   }   History of Present Illness: .   Virginia [name] is a 67 y.o. female with a hx of HTN, state IV lung cancer with brain and bone metastasis, cancer associated pain, HLD, GERD, prior L leg DVT 2021 with history of brain bleed in 04/2020 while on Eliquis prompting discontinuation, former tobacco abuse, anemia, suspected CKD stage 3.   Seen on consultation on 06/07/2019 after out-of-hospital cardiac arrest while eating breakfast with her husband.  She had CPR for over 20 minutes by her husband and then EMS before arriving to the hospital.  She was unresponsive intubated until the second day.  She had aspiration pneumonitis.  She was admitted from 06/10/2023 through 07/02/2023.  She has been made a DNR/DNI.  She is here for hospital follow-up.  She was found to have diminished LVEF of 45 to 50% with global hypokinesis.  She was continued on amiodarone.  She was also having episodes of seizure activity.  She was sent for physical rehab.  She has undergone physical rehab as an inpatient and is having OT and PT at home.  She is currently wheelchair-bound with some left-sided weakness.  Her family is very attentive and meets all of her needs.  They are trying to push extra protein as well as activity in order to strengthen her.  She denies any chest pain, palpitation, or shortness of breath.  She does have some chronic lower back pain.  She does have some soreness substernal area.  She still has some mild slurring speech, but denies any swallowing issues, coughing, or excessive dyspnea with activity.  ROS: As above otherwise negative.  Studies Reviewed: .    Echocardiogram 06/06/2023 1. Left ventricular ejection fraction, by estimation, is 45 to 50%. The  left ventricle has mildly decreased function. The left ventricle   demonstrates global hypokinesis. Left ventricular diastolic parameters are  consistent with Grade I diastolic  dysfunction (impaired relaxation).   2. Right ventricular systolic function is normal. The right ventricular  size is normal.   3. A small pericardial effusion is present. The pericardial effusion is  circumferential.   4. The mitral valve is normal in structure. Trivial mitral valve  regurgitation. No evidence of mitral stenosis.   5. The aortic valve is normal in structure. Aortic valve regurgitation is  mild to moderate. No aortic stenosis is present.   6. There is borderline dilatation of the ascending aorta, measuring 38  mm.   7. The inferior vena cava is normal in size with greater than 50%  respiratory variability, suggesting right atrial pressure of 3 mmHg.    Physical Exam:   VS:  BP 120/80   Pulse 63   Ht 5\' 2"  (1.575 m)   Wt 149 lb (67.6 kg)   SpO2 98%   BMI 27.25 kg/m    Wt Readings from Last 3 Encounters:  07/16/23 149 lb (67.6 kg)  07/01/23 160 lb 15 oz (73 kg)  06/08/23 150 lb (68 kg)    GEN: Well nourished, well developed in no acute distress NECK: No JVD; No carotid bruits CARDIAC: RRR, distant heart sounds without murmurs, rubs, gallops RESPIRATORY:  Clear to auscultation in the upper lobes, crackles bibasilar.  No wheezes or cough.   ABDOMEN: Soft, non-tender, non-distended EXTREMITIES:  No edema; No  deformity left-sided weakness is noted.  Some mild dependent edema.  ASSESSMENT AND PLAN: .    Out-of-hospital arrest: Patient received CPR and was intubated.  She may have aspirated and had a seizure although this is unclear.  She did not have a myocardial infarction although echocardiogram did reveal reduced EF of 45%.  Ventricular fibrillation now on metoprolol, taken off of amiodarone.  2.  Mildly reduced LVEF: EF of 45 to 50% per echocardiogram.  No evidence of volume overload.  I am hearing bibasilar crackles which I believe is due to  hypoventilation.  I have asked her to cough deep breathe and use incentive spirometry.  Take deep breath when she is up on her feet walking to the bathroom or with physical therapy in order to prevent any pneumonia.  Plan to repeat echocardiogram in 3 months.  3.  History of DVT in the left posterior tibial veins.  Had been on Eliquis during this occurrence.  She was taken off of Eliquis due to bleeding intracranially and therefore this was discontinued.         Signed, Bettey Mare. Liborio Nixon, ANP, AACC

## 2023-07-15 ENCOUNTER — Encounter: Payer: Self-pay | Admitting: Physical Medicine and Rehabilitation

## 2023-07-15 ENCOUNTER — Encounter
Payer: Medicare Other | Attending: Physical Medicine and Rehabilitation | Admitting: Physical Medicine and Rehabilitation

## 2023-07-15 VITALS — BP 130/84 | HR 69 | Ht 62.0 in

## 2023-07-15 DIAGNOSIS — C7931 Secondary malignant neoplasm of brain: Secondary | ICD-10-CM | POA: Insufficient documentation

## 2023-07-15 DIAGNOSIS — I469 Cardiac arrest, cause unspecified: Secondary | ICD-10-CM | POA: Diagnosis present

## 2023-07-15 DIAGNOSIS — R5381 Other malaise: Secondary | ICD-10-CM | POA: Diagnosis not present

## 2023-07-15 NOTE — Progress Notes (Signed)
Subjective:    Patient ID: Virginia [name], female    DOB: Nov 06, 1955, 67 y.o.   MRN: [id number]  HPI   Virginia [name] is a 67 y.o. year old female  who  has a past medical history of Anemia, Anxiety, Concussion (09/28/2019), DVT (deep venous thrombosis) (HCC) (2021), Dyspnea, GERD (gastroesophageal reflux disease), Hypercholesterolemia, Hypertension, met lung ca (dx'd 09/2019), PONV (postoperative nausea and vomiting), and Tobacco abuse.   They are presenting to PM&R clinic for follow up related to IPR admission for MI 9/30-10/22/24 .   Interval Hx:  - Therapies: Getting PT and OT at home; they are requesting SLP therapies but are waiting on it. She feels things have bene going "really good" in general, but "today I'm really dizzy."    - Follow ups: Seeing cardiology otmorrow and Dr. [name] Friday; Dr. Barbaraann Cao in December. PCP did a virtual follow up already.   - Falls: none   - DME: She is mostly staying in her wheelchair except for when PT/OT are at home. She needs assistance for transfers, husband is with her 24/7.    - Medications: no changes since being in the hospital. She has not been weighing herself at home; just learned how from her PT.    - Other concerns: Used to dance 3-4 nights a week before she got sick; she danced for the first time last night in quite a while.   Pain Inventory Average Pain 4 Pain Right Now 6 My pain is constant and aching  What TIME of day is your pain at its worst? daytime and evening Sleep (in general) Good  Pain is worse with: walking, bending, sitting, inactivity, standing, and some activites Pain improves with: rest and medication Relief from Meds: 7  walk with assistance use a walker ability to climb steps?  yes do you drive?  no use a wheelchair needs help with transfers  I need assistance with the following:  dressing, bathing, toileting, meal prep, household duties, and shopping Do you have any goals in this area?   yes  weakness trouble walking dizziness confusion anxiety  Any changes since last visit?  no  Any changes since last visit?  no    Family History  Problem Relation Age of Onset   Heart disease Father    Drug abuse Daughter    Drug abuse Son    Cancer Sister    Heart disease Brother    Heart attack Brother    Social History   Socioeconomic History   Marital status: Married    Spouse name: Not on file   Number of children: 3   Years of education: Not on file   Highest education level: Not on file  Occupational History   Occupation: owns Technical brewer: [employer]  Tobacco Use   Smoking status: Former    Current packs/day: 0.00    Types: Cigarettes    Quit date: 10/31/2012    Years since quitting: 10.7   Smokeless tobacco: Never  Vaping Use   Vaping status: Never Used  Substance and Sexual Activity   Alcohol use: Yes    Alcohol/week: 0.0 standard drinks of alcohol    Comment: rare   Drug use: No   Sexual activity: Yes  Other Topics Concern   Not on file  Social History Narrative   Not on file   Social Determinants of Health   Financial Resource Strain: Not on file  Food Insecurity: No Food Insecurity (  06/06/2023)   Hunger Vital Sign    Worried About Running Out of Food in the Last Year: Never true    Ran Out of Food in the Last Year: Never true  Transportation Needs: No Transportation Needs (06/06/2023)   PRAPARE - Administrator, Civil Service (Medical): No    Lack of Transportation (Non-Medical): No  Physical Activity: Not on file  Stress: Not on file  Social Connections: Not on file   Past Surgical History:  Procedure Laterality Date   ABDOMINAL HYSTERECTOMY     partial/ left ovaries   APPLICATION OF CRANIAL NAVIGATION N/A 12/02/2020   Procedure: APPLICATION OF CRANIAL NAVIGATION;  Surgeon: Maeola Harman, MD;  Location: [facility] OR;  Service: Neurosurgery;  Laterality: N/A;   CHOLECYSTECTOMY     CRANIOTOMY N/A 12/02/2020    Procedure: Posterior fossa craniotomy for tumor resection with brainlab;  Surgeon: Maeola Harman, MD;  Location: [facility] OR;  Service: Neurosurgery;  Laterality: N/A;   DILATION AND CURETTAGE OF UTERUS     IR IMAGING GUIDED PORT INSERTION  10/23/2019   IR PATIENT EVAL TECH 0-60 MINS  11/21/2021   IR PATIENT EVAL TECH 0-60 MINS  11/24/2021   IR PATIENT EVAL TECH 0-60 MINS  11/29/2021   IR PATIENT EVAL TECH 0-60 MINS  12/04/2021   IR PATIENT EVAL TECH 0-60 MINS  12/12/2021   IR PATIENT EVAL TECH 0-60 MINS  12/19/2021   IR PATIENT EVAL TECH 0-60 MINS  12/27/2021   IR PATIENT EVAL TECH 0-60 MINS  01/03/2022   IR PATIENT EVAL TECH 0-60 MINS  01/10/2022   IR PATIENT EVAL TECH 0-60 MINS  01/18/2022   IR PATIENT EVAL TECH 0-60 MINS  02/13/2022   IR PATIENT EVAL TECH 0-60 MINS  02/20/2022   IR RADIOLOGIST EVAL & MGMT  01/23/2022   IR REMOVAL TUN ACCESS W/ PORT W/O FL MOD SED  11/17/2021   KYPHOPLASTY N/A 03/15/2020   Procedure: Thoracic Eight KYPHOPLASTY;  Surgeon: Maeola Harman, MD;  Location[facility] OR;  Service: Neurosurgery;  Laterality: N/A;  prone    LIPOMA EXCISION  2018   removed under left breast and right thigh.   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  Opioid Risk Score:   Fall Risk Score:  `1  Depression screen [facility] 2/9     11/29/2021   10:00 AM 08/15/2021   11:26 AM 04/22/2018    4:04 PM 12/07/2016    6:51 PM  Depression screen PHQ 2/9  Decreased Interest 0 0 0 0  Down, Depressed, Hopeless 0 1 0 0  PHQ - 2 Score 0 1 0 0    Review of Systems  Musculoskeletal:  Positive for back pain  and gait problem.  Neurological:  Positive for dizziness and weakness.  Psychiatric/Behavioral:  Positive for confusion.        Anxiety  All other systems reviewed and are negative.      Objective:  Physical Exam   PE: Constitution: Appropriate appearance for age. No apparent distress +Obese Resp: No respiratory distress. No accessory muscle usage. on RA and CTAB Cardio: Well perfused appearance. Trace peripheral edema. Abdomen: Nondistended. Nontender.   Psych: Appropriate mood and affect. Mildly flat. Neuro: AAOx4. + Moderate memory and cognitive deficits  Bilateral hosizontal nystagmus with EOMI; none at rest.   Neurologic Exam:   DTRs: Reflexes were 1+ in bilateral achilles, patella, biceps, BR and triceps. Babinsky: flexor responses b/l.   Hoffmans: negative b/l Sensory exam: revealed normal sensation in all dermatomal regions in bilateral upper extremities and bilateral lower extremities Motor exam: strength 4/5 throughout bilateral upper extremities and bilateral lower extremities Coordination: LUE ataxia; subtle LLE ataxia Gait: not observed due to safety concerns      Assessment & Plan:  Virginia [name] is a 67 y.o. year old female  who  has a past medical history of Anemia, Anxiety, Concussion (09/28/2019), DVT (deep venous thrombosis) (HCC) (2021), Dyspnea, GERD (gastroesophageal reflux disease), Hypercholesterolemia, Hypertension, met lung ca (dx'd 09/2019), PONV (postoperative nausea and vomiting), and Tobacco abuse.    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  Malignant neoplasm metastatic to brain [facility]) I agree with getting home SLP therapies if possible.  I did not see any new signs of neurologic changes on today's exam, but given the increased dizziness, I would talk with Dr. [name] about any further imaging or medication adjustments if needed.   Feel free to  contact me between appointments if any needs or concerns.  Cardiac arrest [facility]) Follow-up with cardiology as scheduled, they should be managing your blood pressure and diuretic medications at this point.  Look for signs of swelling in your hands, feet, or thighs, as well as weight changes of more than 2 to 3 pounds in 1 day or more than 5 pounds in a week to guide whether you are starting to gain fluid.  Shortness of breath can also be a sign of gaining fluid, and in any of these cases, contact her cardiologist or seek emergency care.  Debility Follow-up with me in 2 months for a general review of your quality of life, functional status, and to assess any areas of need as we finished up home therapies.

## 2023-07-15 NOTE — Patient Instructions (Addendum)
Follow-up with me in 2 months for a general review of your quality of life, functional status, and to assess any areas of need as we finished up home therapies.  I agree with getting home SLP therapies if possible.  I did not see any new signs of neurologic changes on today's exam, but given the increased dizziness, I would talk with Dr. [name] about any further imaging or medication adjustments if needed.   Follow-up with cardiology as scheduled, they should be managing your blood pressure and diuretic medications at this point.  Look for signs of swelling in your hands, feet, or thighs, as well as weight changes of more than 2 to 3 pounds in 1 day or more than 5 pounds in a week to guide whether you are starting to gain fluid.  Shortness of breath can also be a sign of gaining fluid, and in any of these cases, contact her cardiologist or seek emergency care.  Feel free to contact me between appointments if any needs or concerns.

## 2023-07-16 ENCOUNTER — Ambulatory Visit: Payer: Medicare Other | Attending: Physician Assistant | Admitting: Adult Health

## 2023-07-16 ENCOUNTER — Encounter: Payer: Self-pay | Admitting: Adult Health

## 2023-07-16 VITALS — BP 120/80 | HR 63 | Ht 62.0 in | Wt 149.0 lb

## 2023-07-16 DIAGNOSIS — I5022 Chronic systolic (congestive) heart failure: Secondary | ICD-10-CM | POA: Insufficient documentation

## 2023-07-16 DIAGNOSIS — Z86718 Personal history of other venous thrombosis and embolism: Secondary | ICD-10-CM | POA: Diagnosis present

## 2023-07-16 DIAGNOSIS — I469 Cardiac arrest, cause unspecified: Secondary | ICD-10-CM | POA: Insufficient documentation

## 2023-07-16 NOTE — Patient Instructions (Signed)
Medication Instructions:  No Changes *If you need a refill on your cardiac medications before your next appointment, please call your pharmacy*   Lab Work: No labs If you have labs (blood work) drawn today and your tests are completely normal, you will receive your results only by: MyChart Message (if you have MyChart) OR A paper copy in the mail If you have any lab test that is abnormal or we need to change your treatment, we will call you to review the results.   Testing/Procedures: No Testing   Follow-Up: At [facility], you and your health needs are our priority.  As part of our continuing mission to provide you with exceptional heart care, we have created designated Provider Care Teams.  These Care Teams include your primary Cardiologist (physician) and Advanced Practice Providers (APPs -  Physician Assistants and Nurse Practitioners) who all work together to provide you with the care you need, when you need it.  We recommend signing up for the patient portal called "MyChart".  Sign up information is provided on this After Visit Summary.  MyChart is used to connect with patients for Virtual Visits (Telemedicine).  Patients are able to view lab/test results, encounter notes, upcoming appointments, etc.  Non-urgent messages can be sent to your provider as well.   To learn more about what you can do with MyChart, go to [email or web address].    Your next appointment:   3 month(s)  Provider:   Senaida Ores, MD

## 2023-07-18 ENCOUNTER — Encounter: Payer: Self-pay | Admitting: Hematology and Oncology

## 2023-07-19 ENCOUNTER — Inpatient Hospital Stay (HOSPITAL_BASED_OUTPATIENT_CLINIC_OR_DEPARTMENT_OTHER): Payer: Medicare Other | Attending: Hematology and Oncology | Admitting: Hematology and Oncology

## 2023-07-19 ENCOUNTER — Inpatient Hospital Stay: Payer: Medicare Other | Attending: Hematology and Oncology

## 2023-07-19 ENCOUNTER — Other Ambulatory Visit: Payer: Self-pay

## 2023-07-19 VITALS — BP 122/71 | HR 70 | Temp 98.3°F | Resp 14 | Wt 147.5 lb

## 2023-07-19 DIAGNOSIS — C7931 Secondary malignant neoplasm of brain: Secondary | ICD-10-CM

## 2023-07-19 DIAGNOSIS — Z79899 Other long term (current) drug therapy: Secondary | ICD-10-CM | POA: Diagnosis not present

## 2023-07-19 DIAGNOSIS — Z7952 Long term (current) use of systemic steroids: Secondary | ICD-10-CM | POA: Insufficient documentation

## 2023-07-19 DIAGNOSIS — R918 Other nonspecific abnormal finding of lung field: Secondary | ICD-10-CM

## 2023-07-19 DIAGNOSIS — Z8673 Personal history of transient ischemic attack (TIA), and cerebral infarction without residual deficits: Secondary | ICD-10-CM | POA: Diagnosis not present

## 2023-07-19 DIAGNOSIS — C3431 Malignant neoplasm of lower lobe, right bronchus or lung: Secondary | ICD-10-CM | POA: Insufficient documentation

## 2023-07-19 DIAGNOSIS — Z86718 Personal history of other venous thrombosis and embolism: Secondary | ICD-10-CM | POA: Insufficient documentation

## 2023-07-19 DIAGNOSIS — E876 Hypokalemia: Secondary | ICD-10-CM | POA: Diagnosis not present

## 2023-07-19 DIAGNOSIS — Z7901 Long term (current) use of anticoagulants: Secondary | ICD-10-CM | POA: Diagnosis not present

## 2023-07-19 DIAGNOSIS — Z90710 Acquired absence of both cervix and uterus: Secondary | ICD-10-CM | POA: Insufficient documentation

## 2023-07-19 DIAGNOSIS — Z9049 Acquired absence of other specified parts of digestive tract: Secondary | ICD-10-CM | POA: Insufficient documentation

## 2023-07-19 DIAGNOSIS — Z79620 Long term (current) use of immunosuppressive biologic: Secondary | ICD-10-CM | POA: Insufficient documentation

## 2023-07-19 DIAGNOSIS — C7951 Secondary malignant neoplasm of bone: Secondary | ICD-10-CM | POA: Diagnosis not present

## 2023-07-19 DIAGNOSIS — Z5112 Encounter for antineoplastic immunotherapy: Secondary | ICD-10-CM | POA: Diagnosis present

## 2023-07-19 DIAGNOSIS — Z95828 Presence of other vascular implants and grafts: Secondary | ICD-10-CM

## 2023-07-19 LAB — CBC WITH DIFFERENTIAL (CANCER CENTER ONLY)
Abs Immature Granulocytes: 0.13 10*3/uL — ABNORMAL HIGH (ref 0.00–0.07)
Basophils Absolute: 0 10*3/uL (ref 0.0–0.1)
Basophils Relative: 0 %
Eosinophils Absolute: 0.1 10*3/uL (ref 0.0–0.5)
Eosinophils Relative: 1 %
HCT: 32.3 % — ABNORMAL LOW (ref 36.0–46.0)
Hemoglobin: 10.6 g/dL — ABNORMAL LOW (ref 12.0–15.0)
Immature Granulocytes: 1 %
Lymphocytes Relative: 15 %
Lymphs Abs: 1.4 10*3/uL (ref 0.7–4.0)
MCH: 34 pg (ref 26.0–34.0)
MCHC: 32.8 g/dL (ref 30.0–36.0)
MCV: 103.5 fL — ABNORMAL HIGH (ref 80.0–100.0)
Monocytes Absolute: 1 10*3/uL (ref 0.1–1.0)
Monocytes Relative: 10 %
Neutro Abs: 6.9 10*3/uL (ref 1.7–7.7)
Neutrophils Relative %: 73 %
Platelet Count: 259 10*3/uL (ref 150–400)
RBC: 3.12 MIL/uL — ABNORMAL LOW (ref 3.87–5.11)
RDW: 14.1 % (ref 11.5–15.5)
WBC Count: 9.6 10*3/uL (ref 4.0–10.5)
nRBC: 0 % (ref 0.0–0.2)

## 2023-07-19 LAB — CMP (CANCER CENTER ONLY)
ALT: 16 U/L (ref 0–44)
AST: 21 U/L (ref 15–41)
Albumin: 3.2 g/dL — ABNORMAL LOW (ref 3.5–5.0)
Alkaline Phosphatase: 62 U/L (ref 38–126)
Anion gap: 4 — ABNORMAL LOW (ref 5–15)
BUN: 55 mg/dL — ABNORMAL HIGH (ref 8–23)
CO2: 32 mmol/L (ref 22–32)
Calcium: 9.5 mg/dL (ref 8.9–10.3)
Chloride: 105 mmol/L (ref 98–111)
Creatinine: 1.52 mg/dL — ABNORMAL HIGH (ref 0.44–1.00)
GFR, Estimated: 38 mL/min — ABNORMAL LOW (ref >60)
Glucose, Bld: 150 mg/dL — ABNORMAL HIGH (ref 70–99)
Potassium: 4.6 mmol/L (ref 3.5–5.1)
Sodium: 141 mmol/L (ref 135–145)
Total Bilirubin: 0.3 mg/dL (ref <1.2)
Total Protein: 6.4 g/dL — ABNORMAL LOW (ref 6.5–8.1)

## 2023-07-19 LAB — TSH: TSH: 1.683 u[IU]/mL (ref 0.350–4.500)

## 2023-07-19 MED ORDER — SODIUM CHLORIDE 0.9 % IV SOLN: Freq: Once | INTRAVENOUS | Status: AC

## 2023-07-19 MED ORDER — HEPARIN SOD (PORK) LOCK FLUSH 100 UNIT/ML IV SOLN: 500.0000 [IU] | Freq: Once | INTRAVENOUS | Status: DC | PRN

## 2023-07-19 MED ORDER — SODIUM CHLORIDE 0.9% FLUSH
10.0000 mL | INTRAVENOUS | Status: DC | PRN
Start: 2023-07-19 — End: 2023-07-19

## 2023-07-19 MED ORDER — SODIUM CHLORIDE 0.9 % IV SOLN
200.0000 mg | Freq: Once | INTRAVENOUS | Status: AC
  Administered 2023-07-19: 200 mg via INTRAVENOUS
  Filled 2023-07-19: qty 200

## 2023-07-19 MED ORDER — PROCHLORPERAZINE MALEATE 10 MG PO TABS
10.0000 mg | ORAL_TABLET | Freq: Once | ORAL | Status: AC
  Administered 2023-07-19: 10 mg via ORAL
  Filled 2023-07-19: qty 1

## 2023-07-19 MED ORDER — ZOLEDRONIC ACID 4 MG/5ML IV CONC
3.0000 mg | INTRAVENOUS | Status: DC
  Administered 2023-07-19: 3 mg via INTRAVENOUS
  Filled 2023-07-19: qty 3.75

## 2023-07-19 NOTE — Progress Notes (Signed)
Per [name] MD, ok to treat with SCR 1.52. Pt getting Keytruda only today.

## 2023-07-19 NOTE — Progress Notes (Unsigned)
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 CT of the head showed showed concerning for metastatic disease involving the left cerebellum and right occipital lobe.  2) 09/30/2019: MRI brain confirms mass within the left cerebellum measures 1.6 x 1.3 x 1.5 cm as well as at least 8 metastatic lesions within the supratentorial and infratentorial brain as outlined 3) 10/01/2019: PET CT scan revealed hypermetabolic infrahilar right lower lobe mass with hypermetabolic nodal metastases in the right hilum, mediastinum, right supraclavicular region, left axilla and lower left neck. 4) 10/08/2019: US guided biopsy of the lymph nodes confirms poorly differentiated non-small cell carcinoma. Immunohistochemistry confirms adenocarcinoma of lung primary. PD-L1 and NGS later revealed a MET Exon 14 mutation and TPS of 90%.  5) 10/12/2019: Establish care with Dr. [name]  6) 11/04/2019: Day 1 of capmatinib 400mg  BID 7) 11/09/2019: presented to Rad/Onc with a DVT in LLE. Seen in symptom management clinic and started on apixaban.  8) 12/29/2019: CT C/A/P showed interval decrease in size of mass involving the superior segment of right lower lobe and reduction in mediastinal and left supraclavicular adenopathy though some small spinal lesions were noted in the interim between the two sets of scans 9) 01/25/2020: temporarily stopped capmatinib due to symptoms of nausea/fatigue.  Restarted therapy on 01/30/2020 after brief chemo holiday.  10) 03/22/2020: CT C/A/P reveals a mixed picture, with new lung nodule and increased lymphadenopathy, however response in bone lesions. D/c capmatinib therapy 11) 04/08/2020: start of Carbo/Pem/Pem for 2nd line treatment. Cycle 1 Day 1   12) 04/29/2020: Cycle 2 Day 1 Carbo/Pem/Pem  13) 05/20/2020: Cycle 3 Day 1 Carbo/Pem/Pem  14) 06/09/2020: Cycle 4 Day 1 Carbo/Pem/Pem  15) 06/29/2020: Cycle 5 Day 1 maintenance Pem/Pem  16) 07/20/2020: Cycle 6 Day 1 maintenance Pem/Pem  17) 08/12/2020: Cycle 7 Day 1 maintenance Pem/Pem  18) 09/01/2020: Cycle 8 Day 1 maintenance Pem/Pem  19) 09/21/2020: Cycle 9 Day 1 maintenance Pem/Pem  20) 10/13/2020: Cycle 10 Day 1 maintenance Pem/Pem  21) 11/03/2020: Cycle 11 Day 1 maintenance Pem/Pem  22) 11/25/2020:  Cycle 12 Day 1 maintenance Pem/Pem 23) 12/15/2020:  Cycle 13 Day 1 maintenance Pem/Pem 24) 01/05/2021:  Cycle 14 Day 1 maintenance Pem/Pem 25) 01/26/2021:  Cycle 15 Day 1 maintenance Pem/Pem plus Bevacizumab.  02/17/2021: Cycle 16 Day 1 maintenance Pem/Pem plus Bevacizumab.  03/10/2021: Cycle 17 Day 1 maintenance Pem/Pem plus Bevacizumab.  03/31/2021: Cycle 18 Day 1 maintenance Pem/Pem  04/20/2021:  Cycle 19 Day 1 maintenance Pem/Pem  05/12/2021: Cycle 20 Day 1 maintenance Pem/Pem 06/01/2021: Cycle 21 Day 1 maintenance Pem/Pem 06/22/2021: Cycle 22 Day 1 maintenance Pem/Pem 07/13/2021: Cycle 23 Day 1 maintenance Pem/Pem 08/16/2021: Cycle 24 Day 1 maintenance Pem/Pem 09/08/2021: Cycle 25 Day 1 maintenance Pem/Pem plus Bevacizumab 09/28/2021: Cycle 26 Day 1 maintenance Pem/Pem plus Bevacizumab 10/20/2021: Cycle 27 Day 1 maintenance Pem/Pem plus Bevacizumab 11/09/2021: Cycle 28 Day 1 maintenance Pem/Pem  12/06/2021: Cycle 29 Day 1 maintenance Pem/Pem  12/27/2021: Cycle 30 Day 1 maintenance Pem/Pem  01/18/2022: Cycle 31 Day 1  maintenance Pem/Pem  02/07/2022: Cycle 32 Day 1 maintenance Pem/Pem  02/28/2022: Cycle 33 Day 1  maintenance Pem/Pem  03/23/2022: Cycle 34 Day 1 maintenance Pem/Pem  04/13/2022: Cycle 35 Day 1 maintenance Pem/Pem  05/04/2022: Cycle 36 Day 1 maintenance Pem/Pem  05/25/2022: Cycle 37 Day 1 maintenance Pem/Pem  06/14/2022: Cycle 38 Day 1 maintenance Pem/Pem 07/05/2022: Cycle 39 Day 1 maintenance Pem/Pem 08/13/2022: Cycle 40 Day 1 maintenance Pem/Pem 09/06/2022:Cycle 41 Day 1 maintenance Pem/Pem 09/27/2022: Cycle 42 Day 1 maintenance Pem/Pem 10/19/2022: Cycle 43 Day 1 maintenance Pem/Pem 11/09/2022: Cycle 44 Day 1 maintenance Pem/Pem 11/30/2022: Cycle 45 Day 1 maintenance Pem/Pem 12/20/2022: Cycle 46 Day 1 maintenance Pem/Pem 01/10/2023: Cycle 47 Day 1 maintenance Pem/Pem 01/31/2023:Cycle 48 Day 1 maintenance Pem/Pem 02/22/2023: Cycle 49 Day 1 maintenance Pem/Pem 03/15/2023: Cycle 50 Day 1 maintenance Pem/Pem 04/04/2023: Cycle 51 Day 1 maintenance Pem/Pem 04/26/2023: Cycle 52 Day 1 maintenance Pem/Pem 05/17/2023: Cycle 53 Day 1 maintenance Pem/Pem  Interval History:  Virginia [name] 67 y.o. female with medical history significant for metastatic adenocarcinoma of the lung presents for a follow up visit. The patient's last visit was on 05/17/2023.  In the interim since the last visit she has had a 2 month gap in treatment due to her prolonged hospitalization and recovery period.   On exam today Virginia [name] reports she is feeling much better having gotten out of the hospital.  She is not yet walking yet and is spending a lot of her time sleeping.  She is dozing off quite frequently.  She likes to color and will sometimes fall asleep but the watercolors in her hand.  She reports that she did well in inpatient rehab.  She reports her energy today is about a 6 out of 10.  Her appetite is improving.  She notes that she is taking the Xtampza twice daily as well as oxycodone about 2 to 3/day.  She reports that she did have a odd instance of vomiting which is rare for her.  She thinks that she took "too any pills at once".   She notes that she did not have any preceding nausea and therefore did not take any Zofran before this occurred.  Overall she feels well and is willing and able to proceed with the immunotherapy portion of her treatment today.  She reports much improvement after her discharge from the hospital..  She denies any fevers, chills, sweats or chest pain. She has no other complaints.  Full 10 point ROS was otherwise negative  MEDICAL HISTORY:  Past Medical History:  Diagnosis Date   Anemia    Anxiety    Concussion 09/28/2019   DVT (deep venous thrombosis) (HCC) 2021   L leg   Dyspnea    GERD (gastroesophageal reflux disease)    Hypercholesterolemia    per pt, she does not have elevated lipids   Hypertension    met lung ca dx'd 09/2019   mets to spine, hip and brain   PONV (postoperative nausea and vomiting)    Tobacco abuse     SURGICAL HISTORY: Past Surgical History:  Procedure Laterality Date   ABDOMINAL HYSTERECTOMY     partial/ left ovaries   APPLICATION OF CRANIAL NAVIGATION N/A 12/02/2020   Procedure: APPLICATION OF CRANIAL NAVIGATION;  Surgeon: Maeola Harman, MD;  Location: [facility] OR;  Service: Neurosurgery;  Laterality: N/A;   CHOLECYSTECTOMY     CRANIOTOMY N/A 12/02/2020   Procedure: Posterior fossa craniotomy for tumor resection with brainlab;  Surgeon: Maeola Harman, MD;  Location: [facility] OR;  Service: Neurosurgery;  Laterality: N/A;   DILATION AND CURETTAGE OF UTERUS     IR IMAGING GUIDED PORT INSERTION  10/23/2019   IR PATIENT EVAL TECH 0-60 MINS  11/21/2021   IR PATIENT EVAL TECH 0-60 MINS  11/24/2021   IR PATIENT EVAL TECH 0-60 MINS  11/29/2021   IR PATIENT EVAL TECH 0-60 MINS  12/04/2021   IR PATIENT EVAL TECH 0-60 MINS  12/12/2021   IR PATIENT EVAL TECH 0-60 MINS  12/19/2021   IR PATIENT EVAL TECH 0-60 MINS  12/27/2021   IR PATIENT EVAL TECH 0-60 MINS  01/03/2022   IR PATIENT EVAL TECH 0-60 MINS  01/10/2022   IR PATIENT EVAL TECH 0-60 MINS  01/18/2022   IR PATIENT EVAL TECH 0-60 MINS   02/13/2022   IR PATIENT EVAL TECH 0-60 MINS  02/20/2022   IR RADIOLOGIST EVAL & MGMT  01/23/2022   IR REMOVAL TUN ACCESS W/ PORT W/O FL MOD SED  11/17/2021   KYPHOPLASTY N/A 03/15/2020   Procedure: Thoracic Eight KYPHOPLASTY;  Surgeon: Maeola Harman, MD;  Location: [facility] OR;  Service: Neurosurgery;  Laterality: N/A;  prone    LIPOMA EXCISION  2018   removed under left breast and right thigh.   TUBAL LIGATION      ALLERGIES:  is allergic to scopolamine.  MEDICATIONS:  Current Outpatient Medications  Medication Sig Dispense Refill   acetaminophen (TYLENOL) 325 MG tablet Take 1-2 tablets (325-650 mg total) by mouth every 4 (four) hours as needed for mild pain (pain score 1-3).     albuterol (VENTOLIN HFA) 108 (90 Base) MCG/ACT inhaler INHALE 2 PUFFS INTO THE LUNGS EVERY 6 HOURS AS NEEDED FOR WHEEZING OR SHORTNESS OF BREATH 6.7 g 11   ALPRAZolam (XANAX) 0.25 MG tablet Take 1 tablet (0.25 mg total) by mouth 2 (two) times daily as needed for anxiety. 30 tablet 0   bisacodyl (DULCOLAX) 5 MG EC tablet Take 1 tablet (5 mg total) by mouth daily as needed for moderate constipation.     Cyanocobalamin (VITAMIN B12 PO) Take 1 tablet by mouth daily. Gummies     dexamethasone (DECADRON) 2 MG tablet Take 0.5 tablets (1 mg total) by mouth daily. 30 tablet 0   feeding supplement (ENSURE ENLIVE / ENSURE PLUS) LIQD Take 237 mLs by mouth 2 (two) times daily between meals.     hydrocortisone (ANUSOL-HC) 2.5 % rectal cream use in rectal area 2 to 3 times a day as needed (Patient taking differently: Place 1 Application rectally daily as needed for hemorrhoids.) 30 g PRN   levETIRAcetam (KEPPRA) 1000 MG tablet Take 1 tablet (1,000 mg total) by mouth 2 (two) times daily. 60 tablet 0   LYSINE PO Take 2 tablets by mouth daily.     magnesium oxide (MAG-OX) 400 (240 Mg) MG tablet Take 1 tablet (400 mg total) by mouth 2 (two) times daily. 60 tablet 0   meclizine (ANTIVERT) 25 MG tablet Take 2 tablets (50 mg total) by mouth 3  (three) times daily. 180 tablet 0   metoprolol succinate (TOPROL-XL) 25 MG 24 hr tablet Take 1 tablet (25 mg total) by mouth daily. 30 tablet 0   Multiple Vitamin (MULTIVITAMIN WITH MINERALS) TABS tablet Take 1 tablet by mouth daily.     Nutritional Supplements (,FEEDING SUPPLEMENT, PROSOURCE PLUS) liquid Take 30 mLs by mouth 2 (two) times daily between meals.     OLANZapine (ZYPREXA) 2.5 MG tablet Take 1 tablet (2.5 mg total) by mouth  at bedtime. 30 tablet 0   ondansetron (ZOFRAN) 4 MG tablet TAKE 1 TABLET(4 MG) BY MOUTH EVERY 8 HOURS AS NEEDED FOR NAUSEA OR VOMITING 40 tablet 2   OVER THE COUNTER MEDICATION Take 2 tablets by mouth daily. Beet root     oxyCODONE (OXY IR/ROXICODONE) 5 MG immediate release tablet Take 1 tablet (5 mg total) by mouth every 4 (four) hours as needed for severe pain (pain score 7-10). 90 tablet 0   pantoprazole (PROTONIX) 40 MG tablet Take 1 tablet (40 mg total) by mouth daily. 90 tablet 3   polyethylene glycol (MIRALAX / GLYCOLAX) 17 g packet Take 17 g by mouth every evening.     potassium chloride SA (KLOR-CON M) 20 MEQ tablet Take 1 tablet (20 mEq total) by mouth 2 (two) times daily. 60 tablet 0   senna-docusate (SENOKOT-S) 8.6-50 MG tablet Take 2 tablets by mouth daily as needed (Patient preference, Sennakot S for mild constipation).     spironolactone (ALDACTONE) 25 MG tablet Take 1 tablet (25 mg total) by mouth daily. 30 tablet 0   Tetrahydrozoline HCl (VISINE OP) Place 1 drop into both eyes daily as needed (irritation).     VITAMIN D PO Take 1 capsule by mouth every evening.     XTAMPZA ER 13.5 MG C12A Take 1 capsule by mouth in the morning and at bedtime. (every 12 hours) 60 capsule 0   No current facility-administered medications for this visit.    REVIEW OF SYSTEMS:   Constitutional: ( - ) fevers, ( - )  chills , ( - ) night sweats Eyes: ( - ) blurriness of vision, ( - ) double vision, ( - ) watery eyes Ears, nose, mouth, throat, and face: ( - ) mucositis,  ( - ) sore throat Respiratory: ( - ) cough, ( + ) dyspnea, ( - ) wheezes Cardiovascular: ( - ) palpitation, ( - ) chest discomfort, ( - ) lower extremity swelling Gastrointestinal:  ( + ) nausea, ( - ) heartburn, ( - ) change in bowel habits Skin: ( - ) abnormal skin rashes Lymphatics: ( - ) new lymphadenopathy, ( - ) easy bruising Neurological: ( - ) numbness, ( - ) tingling, ( - ) new weaknesses Behavioral/Psych: ( - ) mood change, ( - ) new changes  All other systems were reviewed with the patient and are negative.  PHYSICAL EXAMINATION: ECOG PERFORMANCE STATUS: 1 - Symptomatic but completely ambulatory  Vitals:   07/19/23 1320  BP: 122/71  Pulse: 70  Resp: 14  Temp: 98.3 F (36.8 C)  SpO2: 96%      Filed Weights   07/19/23 1320  Weight: 147 lb 8 oz (66.9 kg)    GENERAL: well appearing middle aged Caucasian female in NAD  SKIN: skin color, texture, turgor are normal, no rashes or significant lesions EYES: conjunctiva are pink and non-injected, sclera clear LUNGS: wheezing at lung bases. clear to auscultation and percussion with normal breathing effort HEART: regular rate & rhythm and no murmurs and bilateral trace pitting lower extremity edema Musculoskeletal: no cyanosis of digits and no clubbing PSYCH: alert & oriented x 3, fluent speech NEURO: no focal motor/sensory deficits  LABORATORY DATA:  I have reviewed the data as listed    Latest Ref Rng & Units 07/19/2023   12:58 PM 07/02/2023    7:30 AM 07/01/2023    5:54 AM  CBC  WBC 4.0 - 10.5 K/uL 9.6   7.9   Hemoglobin 12.0 - 15.0  g/dL 40.9  8.7  7.8   Hematocrit 36.0 - 46.0 % 32.3  26.7  24.3   Platelets 150 - 400 K/uL 259   239        Latest Ref Rng & Units 07/19/2023   12:58 PM 07/01/2023    5:54 AM 06/29/2023    8:16 AM  CMP  Glucose 70 - 99 mg/dL 811  914  782   BUN 8 - 23 mg/dL 55  35  37   Creatinine 0.44 - 1.00 mg/dL 9.56  2.13  0.86   Sodium 135 - 145 mmol/L 141  143  140   Potassium 3.5 - 5.1  mmol/L 4.6  4.3  3.7   Chloride 98 - 111 mmol/L 105  106  107   CO2 22 - 32 mmol/L 32  27  24   Calcium 8.9 - 10.3 mg/dL 9.5  9.1  8.4   Total Protein 6.5 - 8.1 g/dL 6.4     Total Bilirubin <1.2 mg/dL 0.3     Alkaline Phos 38 - 126 U/L 62     AST 15 - 41 U/L 21     ALT 0 - 44 U/L 16        RADIOGRAPHIC STUDIES: I personally have viewed the radiographic studies below: DG CHEST PORT 1 VIEW  Result Date: 06/28/2023 CLINICAL DATA:  Volume overload. EXAM: PORTABLE CHEST 1 VIEW COMPARISON:  June 03, 2023. FINDINGS: Stable cardiomegaly. Endotracheal and nasogastric tubes have been removed. Status post kyphoplasty of midthoracic vertebral body. Left lung is clear. Mild right basilar atelectasis or infiltrate is noted. IMPRESSION: Mild right basilar atelectasis or pneumonia is noted. Followup PA and lateral chest X-ray is recommended in 3-4 weeks following trial of antibiotic therapy to ensure resolution and exclude underlying malignancy. Electronically Signed   By: Lupita Raider M.D.   On: 06/28/2023 14:24     ASSESSMENT & PLAN ORDELL URESTE is a 67 y.o. female with medical history significant for metastatic adenocarcinoma of the lung presents for a follow up visit. 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   Previously we discussed Carboplatin/Pembrolizumab/Pemetrexed and the expected side effect from these medications.  We discussed the possible side effects of immunotherapy including colitis, hepatitis, dermatitis, and pneumonitis.  Additionally we discussed the side effects of carboplatin which include suppression of blood counts, nephrotoxicity, and nausea.  Additionally we discussed pemetrexed which can also have effects on counts and would require supplementation with vitamin B12 and folic acid.  She voiced her understanding of these side effects and treatment plans moving forward.  I also noted that we would be able to drop the chemotherapy  agents that she had intolerance to continue with pembrolizumab alone given her high TPS score.  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 Due to worsening dizziness, she resumed this from Cycle 25-27.  --Most recent CT CAP from 02/16/2023 showed stable disease.  PLAN: --Due for Cycle 54, Day 1 of maintenance Pem/Pem today --Labs from today were reviewed and adequate for treatment.  Labs today show white blood cell count white blood cell 9.6, hemoglobin 10.6, MCV 103.5, platelets 259 --Proceed with treatment today without any dose modifications.  --Last CT scan in Sept 2024 showed no evidence of progressive disease.  Repeat CT scan due late Dec 2024.  --RTC q 3 weeks for labs, follow up visit prior to Cycle 55.    #Appetite loss/weight loss: --Encouraged high protein diet and supplement with protein shakes --Advised to eat small, frequent meals.   #Brain Metastasis, stable --appreciate the assistance of Dr. Barbaraann Cao in treating her brain metastasis and symptoms. --radiation therapy to the brain completed on 11/16/2019.  --MRI brain on 04/29/2020 showed evidence of small brain bleed. Eliquis therapy was discontinued.  --MRI on 11/04/2020 showed concern for progression of intracranial disease.  --On 12/02/2020, patient underwent craniotomy, resection of progressive cerebellar mass by Dr. Venetia Maxon. 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  --MRI on 12/23/2020 showed evidence of radiation necrosis. Dr. Barbaraann Cao evaluated the patient on 01/02/2021 with recommendations to add IV Bevacizumab 10 mg/kg q 3 weeks ( started 01/26/2021). Completed a full course of this therapy. --Patient was evaluated by Dr. Barbaraann Cao on 08/29/2021 and recommended to resume Bevacizumab due to worsening dizziness.  Which was given  from Cycle 25-27.  Plan: --continue to follow with Dr. Barbaraann Cao  --Most recent MRI from 04/01/2023 showed increased enhancement. Dr. 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 Plan to repeat MRI brain on 08/19/2023.   #Nausea/Vomiting, worsening --Regimen includes Zofran 4-8mg  q8h PRN and compazine 10mg  q6H for breakthrough PRN, Olanzpaine 2.5 mg PO QHS to help with nausea.  She is currently using Phenergan for breakthrough. --continue to monitor   #Hypokalemia --likely 2/2 to decreased PO intake, hypovolemia, and BP medications --Potassium level 4.6 today.   --Currently on potassium chloride tablets, 40 mEq PO potassium in AM and in PM.   #Pain Control, stable --continue oxycodone 5mg  q4H PRN. Can increase dose to 10mg  q6H if pain is severe. She is taking approximately 2-3 pills per day.  --continue Xtampza 13.5 mg ER BID for long acting support.  --continue to take with Senokot to prevent opioid induced constipation. 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 Next dose due today.    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    # Hypertension: --Continue Norvasc to 10 mg daily.  --continue to monitor at home and in clinic.    Orders Placed This Encounter  Procedures   CT CHEST ABDOMEN PELVIS W CONTRAST    Standing Status:   Future    Standing Expiration Date:   07/20/2024    Order Specific Question:   If indicated for the ordered procedure, I authorize the administration of contrast media per Radiology protocol    Answer:   Yes    Order Specific Question:   Does the patient have a contrast media/X-ray dye allergy?    Answer:   No    Order Specific Question:   Preferred  imaging location?    Answer:   [facility]    Order Specific Question:   If indicated for the ordered procedure, I authorize the administration of oral contrast media per Radiology protocol    Answer:   Yes   TSH    Standing Status:   Future    Standing Expiration Date:   09/01/2024   CBC with Differential (Cancer Center Only)    Standing Status:   Future    Standing Expiration Date:  09/01/2024   CMP (Cancer Center only)    Standing Status:   Future    Standing Expiration Date:   09/01/2024   TSH    Standing Status:   Future    Standing Expiration Date:   09/22/2024   CBC with Differential (Cancer Center Only)    Standing Status:   Future    Standing Expiration Date:   09/22/2024   CMP (Cancer Center only)    Standing Status:   Future    Standing Expiration Date:   09/22/2024    All questions were answered. The patient knows to call the clinic with any problems, questions or concerns.  I have spent a total of 30 minutes minutes of face-to-face and non-face-to-face time, preparing to see the patient, performing a medically appropriate examination, counseling and educating the patient,  documenting clinical information in the electronic health record, and care coordination.   Ulysees Barns, MD Department of Hematology/Oncology [facility] at [facility] Phone: [phone] Pager: [phone] Email: [email or web address]  07/21/2023 6:28 PM   Literature Support:  Lynn Ito, Rodrguez-Abreu D, Naaman Plummer, Felip E, De Angelis F, Domine M, [place], Hochmair MJ, [place], [place], [address], [place], Grossi F, [place], Reck M, [place], [place], [place], Rubio-Viqueira B, Novello S, Kurata T, Gray JE, Vida J, Wei Z, Yang J, Raftopoulos H, [place], [place] [employer]; [id number] Investigators. Pembrolizumab plus Chemotherapy in Metastatic Non-Small-Cell Lung Cancer. Malva Limes Med. 2018 May 31;378(22):2078-2092.  --Median  progression-free survival was 8.8 months (95% CI, 7.6 to 9.2) in the pembrolizumab-combination group and 4.9 months (95% CI, 4.7 to 5.5) in the placebo-combination group (hazard ratio for disease progression or death, 0.52; 95% CI, 0.43 to 0.64; P<0.001). Adverse events of grade 3 or higher occurred in 67.2% of the patients in the pembrolizumab-combination group and in 65.8% of those in the placebo-combination group.  Cristal Generous L. Efficacy of pemetrexed-based regimens in advanced non-small cell lung cancer patients with activating epidermal growth factor receptor mutations after tyrosine kinase inhibitor failure: a systematic review. Onco Targets Ther. 2018;11:2121-2129.   --The weighted median PFS, median OS, and ORR for patients treated with pem regimens were 5.09 months, 15.91 months, and 30.19%, respectively. Our systematic review results showed a favorable efficacy profile of pem regimens in NSCLC patients with EGFR mutation after EGFR-TKI failure.

## 2023-07-19 NOTE — Patient Instructions (Signed)
 [facility] - A DEPT OF [facility]  Discharge Instructions: Thank you for choosing [facility] to provide your oncology and hematology care.   If you have a lab appointment with the Cancer Center, please go directly to the Cancer Center and check in at the registration area.   Wear comfortable clothing and clothing appropriate for easy access to any Portacath or PICC line.   We strive to give you quality time with your provider. You may need to reschedule your appointment if you arrive late (15 or more minutes).  Arriving late affects you and other patients whose appointments are after yours.  Also, if you miss three or more appointments without notifying the office, you may be dismissed from the clinic at the provider's discretion.      For prescription refill requests, have your pharmacy contact our office and allow 72 hours for refills to be completed.    Today you received the following chemotherapy and/or immunotherapy agents: Keytruda.       To help prevent nausea and vomiting after your treatment, we encourage you to take your nausea medication as directed.  BELOW ARE SYMPTOMS THAT SHOULD BE REPORTED IMMEDIATELY: *FEVER GREATER THAN 100.4 F (38 C) OR HIGHER *CHILLS OR SWEATING *NAUSEA AND VOMITING THAT IS NOT CONTROLLED WITH YOUR NAUSEA MEDICATION *UNUSUAL SHORTNESS OF BREATH *UNUSUAL BRUISING OR BLEEDING *URINARY PROBLEMS (pain or burning when urinating, or frequent urination) *BOWEL PROBLEMS (unusual diarrhea, constipation, pain near the anus) TENDERNESS IN MOUTH AND THROAT WITH OR WITHOUT PRESENCE OF ULCERS (sore throat, sores in mouth, or a toothache) UNUSUAL RASH, SWELLING OR PAIN  UNUSUAL VAGINAL DISCHARGE OR ITCHING   Items with * indicate a potential emergency and should be followed up as soon as possible or go to the Emergency Department if any problems should occur.  Please show the CHEMOTHERAPY ALERT CARD or  IMMUNOTHERAPY ALERT CARD at check-in to the Emergency Department and triage nurse.  Should you have questions after your visit or need to cancel or reschedule your appointment, please contact [facility] - A DEPT OF [facility]  Dept: [phone]  and follow the prompts.  Office hours are 8:00 a.m. to 4:30 p.m. Monday - Friday. Please note that voicemails left after 4:00 p.m. may not be returned until the following business day.  We are closed weekends and major holidays. You have access to a nurse at all times for urgent questions. Please call the main number to the clinic Dept: [phone] and follow the prompts.   For any non-urgent questions, you may also contact your provider using MyChart. We now offer e-Visits for anyone 57 and older to request care online for non-urgent symptoms. For details visit [email or web address].   Also download the MyChart app! Go to the app store, search "MyChart", open the app, select [place], and log in with your MyChart username and password.

## 2023-07-21 ENCOUNTER — Encounter: Payer: Self-pay | Admitting: Hematology and Oncology

## 2023-07-22 ENCOUNTER — Other Ambulatory Visit (HOSPITAL_COMMUNITY): Payer: Self-pay

## 2023-07-26 ENCOUNTER — Telehealth: Payer: Self-pay | Admitting: Adult Health

## 2023-07-26 MED ORDER — SPIRONOLACTONE 25 MG PO TABS: 25.0000 mg | ORAL_TABLET | Freq: Every day | ORAL | 3 refills | Status: DC

## 2023-07-26 MED ORDER — METOPROLOL SUCCINATE ER 25 MG PO TB24: 25.0000 mg | ORAL_TABLET | Freq: Every day | ORAL | 3 refills | Status: DC

## 2023-07-26 NOTE — Telephone Encounter (Signed)
*  STAT* If patient is at the pharmacy, call can be transferred to refill team.   1. Which medications need to be refilled? (please list name of each medication and dose if known) metoprolol succinate (TOPROL-XL) 25 MG 24 hr tablet    spironolactone (ALDACTONE) 25 MG tablet    2. Which pharmacy/location (including street and city if local pharmacy) is medication to be sent to? [facility] [phone] - [place], [place] - [address]   3. Do they need a 30 day or 90 day supply? 90

## 2023-07-26 NOTE — Telephone Encounter (Signed)
Pt's medications were sent to pt's pharmacy as requested. Confirmation received.  

## 2023-07-29 ENCOUNTER — Telehealth: Payer: Self-pay | Admitting: *Deleted

## 2023-07-29 ENCOUNTER — Other Ambulatory Visit: Payer: Self-pay | Admitting: Hematology and Oncology

## 2023-07-29 MED ORDER — OLANZAPINE 2.5 MG PO TABS: 2.5000 mg | ORAL_TABLET | Freq: Every day | ORAL | 1 refills | Status: DC

## 2023-07-29 MED ORDER — POTASSIUM CHLORIDE CRYS ER 20 MEQ PO TBCR: 20.0000 meq | EXTENDED_RELEASE_TABLET | Freq: Two times a day (BID) | ORAL | 1 refills | Status: DC

## 2023-07-29 MED ORDER — LEVETIRACETAM 1000 MG PO TABS: 1000.0000 mg | ORAL_TABLET | Freq: Two times a day (BID) | ORAL | 2 refills | Status: DC

## 2023-07-29 MED ORDER — MECLIZINE HCL 50 MG PO TABS: 50.0000 mg | ORAL_TABLET | Freq: Three times a day (TID) | ORAL | 2 refills | Status: DC

## 2023-07-29 MED ORDER — DEXAMETHASONE 1 MG PO TABS: 1.0000 mg | ORAL_TABLET | Freq: Every day | ORAL | 0 refills | Status: DC

## 2023-07-29 NOTE — Telephone Encounter (Signed)
Spoke called to request new Rx's from original prescribing providers.  Patient was discharged from inpatient rehab with limited prescriptions.  Need new ones to [facility] on file.  Decadron 1 mg (instead of 2 mg's requiring split) takes 1 tablet daily.  QTY 90 if possible.  Keppra 1000 mg BID QTY 90  Meclizine 25 mg (takes 50 mg) TID QTY 90  Routed the above to Dr Barbaraann Cao

## 2023-07-29 NOTE — Addendum Note (Signed)
Addended by: Henreitta Leber on: 07/29/2023 04:08 PM   Modules accepted: Orders

## 2023-07-29 NOTE — Telephone Encounter (Signed)
Spoke called to request new Rx's from original prescribing providers. Patient was discharged from inpatient rehab with limited prescriptions. Need new ones to [facility] on file.   Olanzapine 2.5 mg (1 tablet @ Bedtime) Qty 90 if possible Potassium Chloride 20 mEq BID Qty 90 if possible  Routed the above to Dr [name] for refills

## 2023-07-31 ENCOUNTER — Encounter: Payer: Self-pay | Admitting: Hematology and Oncology

## 2023-07-31 NOTE — Telephone Encounter (Signed)
Telephone call  

## 2023-08-01 ENCOUNTER — Encounter: Payer: Self-pay | Admitting: Hematology and Oncology

## 2023-08-01 NOTE — Telephone Encounter (Signed)
Telephone call  

## 2023-08-07 ENCOUNTER — Telehealth: Payer: Self-pay | Admitting: Internal Medicine

## 2023-08-07 ENCOUNTER — Other Ambulatory Visit: Payer: Self-pay | Admitting: Nurse Practitioner

## 2023-08-07 DIAGNOSIS — E032 Hypothyroidism due to medicaments and other exogenous substances: Secondary | ICD-10-CM

## 2023-08-07 DIAGNOSIS — C7931 Secondary malignant neoplasm of brain: Secondary | ICD-10-CM

## 2023-08-07 NOTE — Telephone Encounter (Signed)
-------  Fax Transmission Report-------  To:               Recipient at [phone] Subject:          Fw: Hp Scans Result:           The transmission was successful. Explanation:      All Pages Ok Pages Sent:       5 Connect Time:     2 minutes, 9 seconds Transmit Time:    08/07/2023 13:22 Transfer Rate:    14400 Status Code:      0000 Retry Count:      0 Job Id:           [id number] Unique Id:        [id number] Fax Line:         25 Fax Server:       [employer]

## 2023-08-07 NOTE — Telephone Encounter (Signed)
-------  Fax Transmission Report-------  To:               Recipient at [phone] Subject:          Fw: Hp Scans Result:           The transmission was successful. Explanation:      All Pages Ok Pages Sent:       5 Connect Time:     2 minutes, 31 seconds Transmit Time:    08/07/2023 13:22 Transfer Rate:    14400 Status Code:      0000 Retry Count:      0 Job Id:           [id number] Unique Id:        [id number] Fax Line:         58 Fax Server:       [email or web address]

## 2023-08-07 NOTE — Telephone Encounter (Signed)
Copied from CRM [id number]. Topic: Clinical - Home Health Verbal Orders >> Aug 07, 2023 11:13 AM Shelbie Proctor wrote: Caller/Agency: [facility] [phone] Frequency: FYI, they were unsure due to the holidays patient would not have PT, but they are able to provide patient with PT 2x this week. Any new concerns about the patient? no

## 2023-08-09 ENCOUNTER — Ambulatory Visit: Payer: Medicare Other | Admitting: Nurse Practitioner

## 2023-08-09 ENCOUNTER — Other Ambulatory Visit: Payer: Self-pay | Admitting: Hematology and Oncology

## 2023-08-09 ENCOUNTER — Other Ambulatory Visit: Payer: Medicare Other

## 2023-08-09 ENCOUNTER — Ambulatory Visit: Payer: Medicare Other

## 2023-08-11 NOTE — Progress Notes (Unsigned)
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 Due to worsening dizziness, she resumed this from Cycle 25-27.  --Most recent CT CAP from 02/16/2023 showed stable disease.  PLAN: --Due for Cycle 55, Day 1 of maintenance Pem/Pem today --Labs from today were reviewed and adequate for treatment.  Labs today show white blood cell count white blood cell 9.6, hemoglobin 10.6, MCV 103.5, platelets 259 --Proceed with treatment today without any dose modifications.  --Last CT scan in Sept 2024 showed no evidence of progressive disease.  Repeat CT scan due late Dec 2024.  --RTC q 3 weeks for labs, follow up visit prior to Cycle 56.   Plan: Labs reviewed  -CBC showing WBC 8.1; Hgb 11.3; Hct 0.6; Plt 287; MCV 102.1 -CMP - K 4.0; glucose 109; BUN 49; Creatinine 1.32; eGFR 45; Ca 8.6; LFTs normal.   -CT chest abdomen pelvis for restaging per Dr. [name]. -MRI of the brain has been ordered by Dr. Barbaraann Cao and is scheduled for 08/19/2023. Serum creatinine improved to 1.32.  Now adequate for treatment with both pemetrexed and pembrolizumab.. Labs/flush, follow up, and treatment as scheduled   The patient understands the plans discussed today and is in agreement with them.  She knows to contact our office if she develops concerns prior to her next  appointment.  I provided 30 minutes of face-to-face time during this encounter and > 50% was spent counseling as documented under my assessment and plan.    Carlean Jews, NP  [facility] - A DEPT OF [facility] [address] [place] [address] Dept: [phone] Dept Fax: [phone]   No orders of the defined types were placed in this encounter.     CHIEF COMPLAINT:  CC: Metastatic adenocarcinoma of the lung  Current Treatment: Maintenance pembrolizumab and pemetrexed every 3 weeks  INTERVAL HISTORY:  Virginia [name] is here today for repeat clinical assessment.  With seen by Dr. [name] on 07/19/2023.  She is scheduled for brain MRI on 08/19/2023 per Dr. Barbaraann Cao.  A restaging CT CAP has been ordered per Dr. [name].  This is due in 08/2023.  The patient says she does have moderate fatigue.  Over the last 2 or 3 days did have some lower extremity swelling.  This has resolved.  She denies chest pain, chest pressure, or shortness of breath. She denies headaches or visual disturbances. She denies abdominal pain, nausea, vomiting, or changes in bowel or bladder habits.   Her appetite is good. Her weight has increased 8 pounds over last 1 month .  I have reviewed the past medical history, past surgical history, social history and family history with the patient and they are unchanged from previous note.  ALLERGIES:  is allergic to scopolamine.  MEDICATIONS:  Current Outpatient Medications  Medication Sig Dispense Refill   acetaminophen (TYLENOL) 325 MG tablet Take 1-2 tablets (325-650 mg  total) by mouth every 4 (four) hours as needed for mild pain (pain score 1-3).     albuterol (VENTOLIN HFA) 108 (90 Base) MCG/ACT inhaler INHALE 2 PUFFS INTO THE LUNGS EVERY 6 HOURS AS NEEDED FOR WHEEZING OR SHORTNESS OF BREATH 6.7 g 11   ALPRAZolam (XANAX) 0.25 MG tablet Take 1 tablet (0.25 mg total) by mouth 2 (two) times daily as needed  for anxiety. 30 tablet 0   bisacodyl (DULCOLAX) 5 MG EC tablet Take 1 tablet (5 mg total) by mouth daily as needed for moderate constipation.     Cyanocobalamin (VITAMIN B12 PO) Take 1 tablet by mouth daily. Gummies     dexamethasone (DECADRON) 1 MG tablet Take 1 tablet (1 mg total) by mouth daily. 90 tablet 0   feeding supplement (ENSURE ENLIVE / ENSURE PLUS) LIQD Take 237 mLs by mouth 2 (two) times daily between meals.     hydrocortisone (ANUSOL-HC) 2.5 % rectal cream use in rectal area 2 to 3 times a day as needed (Patient taking differently: Place 1 Application rectally daily as needed for hemorrhoids.) 30 g PRN   levETIRAcetam (KEPPRA) 1000 MG tablet Take 1 tablet (1,000 mg total) by mouth 2 (two) times daily. 90 tablet 2   LYSINE PO Take 2 tablets by mouth daily.     magnesium oxide (MAG-OX) 400 (240 Mg) MG tablet Take 1 tablet (400 mg total) by mouth 2 (two) times daily. 60 tablet 0   meclizine (ANTIVERT) 50 MG tablet Take 1 tablet (50 mg total) by mouth 3 (three) times daily. 90 tablet 2   metoprolol succinate (TOPROL-XL) 25 MG 24 hr tablet Take 1 tablet (25 mg total) by mouth daily. 90 tablet 3   Multiple Vitamin (MULTIVITAMIN WITH MINERALS) TABS tablet Take 1 tablet by mouth daily.     Nutritional Supplements (,FEEDING SUPPLEMENT, PROSOURCE PLUS) liquid Take 30 mLs by mouth 2 (two) times daily between meals.     OLANZapine (ZYPREXA) 2.5 MG tablet Take 1 tablet (2.5 mg total) by mouth at bedtime. 90 tablet 1   ondansetron (ZOFRAN) 4 MG tablet TAKE 1 TABLET(4 MG) BY MOUTH EVERY 8 HOURS AS NEEDED FOR NAUSEA OR VOMITING 40 tablet 2   OVER THE COUNTER MEDICATION Take 2 tablets by mouth daily. Beet root     oxyCODONE (OXY IR/ROXICODONE) 5 MG immediate release tablet Take 1 tablet (5 mg total) by mouth every 4 (four) hours as needed for severe pain (pain score 7-10). 90 tablet 0   pantoprazole (PROTONIX) 40 MG tablet Take 1 tablet (40 mg total) by mouth daily. 90 tablet 3   polyethylene glycol  (MIRALAX / GLYCOLAX) 17 g packet Take 17 g by mouth every evening.     potassium chloride SA (KLOR-CON M) 20 MEQ tablet Take 1 tablet (20 mEq total) by mouth 2 (two) times daily. 90 tablet 1   senna-docusate (SENOKOT-S) 8.6-50 MG tablet Take 2 tablets by mouth daily as needed (Patient preference, Sennakot S for mild constipation).     spironolactone (ALDACTONE) 25 MG tablet Take 1 tablet (25 mg total) by mouth daily. 90 tablet 3   Tetrahydrozoline HCl (VISINE OP) Place 1 drop into both eyes daily as needed (irritation).     VITAMIN D PO Take 1 capsule by mouth every evening.     XTAMPZA ER 13.5 MG C12A Take 1 capsule by mouth in the morning and at bedtime. (every 12 hours) 60 capsule 0   No current facility-administered medications for this visit.  HISTORY OF PRESENT ILLNESS:   Oncology History  Malignant neoplasm metastatic to brain (HCC)  10/15/2019 Initial Diagnosis   Brain metastases (HCC)   04/08/2020 - 05/03/2022 Chemotherapy   Patient is on Treatment Plan : LUNG Carboplatin + Pemetrexed + Pembrolizumab q21d Induction      04/08/2020 -  Chemotherapy   Patient is on Treatment Plan : LUNG Carboplatin (5) + Pemetrexed (500) + Pembrolizumab (200) D1 q21d Induction x 4 cycles / Maintenance Pemetrexed (500) + Pembrolizumab (200) D1 q21d     Malignant neoplasm metastatic to bone (HCC)      REVIEW OF SYSTEMS:   Constitutional: Denies fevers, chills or abnormal weight loss. Moderate fatigue Eyes: Denies blurriness of vision Ears, nose, mouth, throat, and face: Denies mucositis or sore throat Respiratory: Denies cough, dyspnea or wheezes Cardiovascular: Denies palpitation or chest discomfort.  She reports some lower extremity swelling for 2 to 3 days.  This is resolved. Gastrointestinal:  Denies nausea, heartburn or change in bowel habits Skin: Denies abnormal skin rashes Lymphatics: Denies new lymphadenopathy or easy bruising Neurological:Denies numbness, tingling or new  weaknesses Behavioral/Psych: Mood is stable, no new changes  All other systems were reviewed with the patient and are negative.   VITALS:   Today's Vitals   08/12/23 1152  BP: 130/84  Pulse: 71  Resp: 17  Temp: 97.9 F (36.6 C)  TempSrc: Temporal  SpO2: 98%  Weight: 155 lb 12.8 oz (70.7 kg)   Body mass index is 28.5 kg/m.   Wt Readings from Last 3 Encounters:  08/12/23 155 lb 12.8 oz (70.7 kg)  07/19/23 147 lb 8 oz (66.9 kg)  07/16/23 149 lb (67.6 kg)    Body mass index is 28.5 kg/m.  Performance status (ECOG): 1 - Symptomatic but completely ambulatory  PHYSICAL EXAM:   GENERAL:alert, no distress and comfortable SKIN: skin color, texture, turgor are normal, no rashes or significant lesions EYES: normal, Conjunctiva are pink and non-injected, sclera clear OROPHARYNX:no exudate, no erythema and lips, buccal mucosa, and tongue normal  NECK: supple, thyroid normal size, non-tender, without nodularity LYMPH:  no palpable lymphadenopathy in the cervical, axillary or inguinal LUNGS: clear to auscultation and percussion with normal breathing effort HEART: regular rate & rhythm and no murmurs and no lower extremity edema ABDOMEN:abdomen soft, non-tender and normal bowel sounds Musculoskeletal:no cyanosis of digits and no clubbing  NEURO: alert & oriented x 3 with fluent speech, no focal motor/sensory deficits  LABORATORY DATA:  I have reviewed the data as listed    Component Value Date/Time   NA 140 08/12/2023 1110   K 4.0 08/12/2023 1110   CL 106 08/12/2023 1110   CO2 30 08/12/2023 1110   GLUCOSE 109 (H) 08/12/2023 1110   BUN 49 (H) 08/12/2023 1110   CREATININE 1.32 (H) 08/12/2023 1110   CREATININE 0.58 11/15/2017 0948   CALCIUM 8.6 (L) 08/12/2023 1110   PROT 6.0 (L) 08/12/2023 1110   ALBUMIN 3.1 (L) 08/12/2023 1110   AST 16 08/12/2023 1110   ALT 15 08/12/2023 1110   ALKPHOS 48 08/12/2023 1110   BILITOT 0.2 08/12/2023 1110   GFRNONAA 45 (L) 08/12/2023 1110    GFRNONAA 100 11/15/2017 0948   GFRAA >60 06/09/2020 1051   GFRAA 116 11/15/2017 0948     Lab Results  Component Value Date   WBC 8.1 08/12/2023   NEUTROABS 6.9 07/19/2023   HGB 11.3 (L) 08/12/2023   HCT 34.6 (L) 08/12/2023   MCV 102.1 (H) 08/12/2023   PLT 237  08/12/2023     

## 2023-08-11 NOTE — Assessment & Plan Note (Addendum)
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 Due to worsening dizziness, she resumed this from Cycle 25-27.  --Most recent CT CAP from 02/16/2023 showed stable disease.  PLAN: --Due for Cycle 55, Day 1 of maintenance Pem/Pem today --Labs from today were reviewed and adequate for treatment.  Labs today show white blood cell count white blood cell 9.6, hemoglobin 10.6, MCV 103.5, platelets 259 --Proceed with treatment today without any dose modifications.  --Last CT scan in Sept 2024 showed no evidence of progressive disease.  Repeat CT scan due late Dec 2024.  --RTC q 3 weeks for labs, follow up visit prior to Cycle 56.

## 2023-08-12 ENCOUNTER — Encounter: Payer: Self-pay | Admitting: Nurse Practitioner

## 2023-08-12 ENCOUNTER — Telehealth: Payer: Self-pay | Admitting: Internal Medicine

## 2023-08-12 ENCOUNTER — Inpatient Hospital Stay: Payer: Medicare Other | Attending: Hematology and Oncology

## 2023-08-12 ENCOUNTER — Inpatient Hospital Stay (HOSPITAL_BASED_OUTPATIENT_CLINIC_OR_DEPARTMENT_OTHER): Payer: Medicare Other | Admitting: Nurse Practitioner

## 2023-08-12 ENCOUNTER — Inpatient Hospital Stay: Payer: Medicare Other

## 2023-08-12 ENCOUNTER — Encounter: Payer: Self-pay | Admitting: Hematology and Oncology

## 2023-08-12 VITALS — BP 130/84 | HR 71 | Temp 97.9°F | Resp 17 | Wt 155.8 lb

## 2023-08-12 DIAGNOSIS — C7931 Secondary malignant neoplasm of brain: Secondary | ICD-10-CM

## 2023-08-12 DIAGNOSIS — C7951 Secondary malignant neoplasm of bone: Secondary | ICD-10-CM | POA: Diagnosis not present

## 2023-08-12 DIAGNOSIS — Z79620 Long term (current) use of immunosuppressive biologic: Secondary | ICD-10-CM | POA: Insufficient documentation

## 2023-08-12 DIAGNOSIS — R918 Other nonspecific abnormal finding of lung field: Secondary | ICD-10-CM

## 2023-08-12 DIAGNOSIS — Z79631 Long term (current) use of antimetabolite agent: Secondary | ICD-10-CM | POA: Insufficient documentation

## 2023-08-12 DIAGNOSIS — C3490 Malignant neoplasm of unspecified part of unspecified bronchus or lung: Secondary | ICD-10-CM | POA: Diagnosis not present

## 2023-08-12 DIAGNOSIS — Z5112 Encounter for antineoplastic immunotherapy: Secondary | ICD-10-CM | POA: Diagnosis present

## 2023-08-12 DIAGNOSIS — C3431 Malignant neoplasm of lower lobe, right bronchus or lung: Secondary | ICD-10-CM | POA: Diagnosis present

## 2023-08-12 DIAGNOSIS — E032 Hypothyroidism due to medicaments and other exogenous substances: Secondary | ICD-10-CM

## 2023-08-12 LAB — CMP (CANCER CENTER ONLY)
ALT: 15 U/L (ref 0–44)
AST: 16 U/L (ref 15–41)
Albumin: 3.1 g/dL — ABNORMAL LOW (ref 3.5–5.0)
Alkaline Phosphatase: 48 U/L (ref 38–126)
Anion gap: 4 — ABNORMAL LOW (ref 5–15)
BUN: 49 mg/dL — ABNORMAL HIGH (ref 8–23)
CO2: 30 mmol/L (ref 22–32)
Calcium: 8.6 mg/dL — ABNORMAL LOW (ref 8.9–10.3)
Chloride: 106 mmol/L (ref 98–111)
Creatinine: 1.32 mg/dL — ABNORMAL HIGH (ref 0.44–1.00)
GFR, Estimated: 45 mL/min — ABNORMAL LOW (ref >60)
Glucose, Bld: 109 mg/dL — ABNORMAL HIGH (ref 70–99)
Potassium: 4 mmol/L (ref 3.5–5.1)
Sodium: 140 mmol/L (ref 135–145)
Total Bilirubin: 0.2 mg/dL (ref <1.2)
Total Protein: 6 g/dL — ABNORMAL LOW (ref 6.5–8.1)

## 2023-08-12 LAB — CBC (CANCER CENTER ONLY)
HCT: 34.6 % — ABNORMAL LOW (ref 36.0–46.0)
Hemoglobin: 11.3 g/dL — ABNORMAL LOW (ref 12.0–15.0)
MCH: 33.3 pg (ref 26.0–34.0)
MCHC: 32.7 g/dL (ref 30.0–36.0)
MCV: 102.1 fL — ABNORMAL HIGH (ref 80.0–100.0)
Platelet Count: 237 10*3/uL (ref 150–400)
RBC: 3.39 MIL/uL — ABNORMAL LOW (ref 3.87–5.11)
RDW: 12.5 % (ref 11.5–15.5)
WBC Count: 8.1 10*3/uL (ref 4.0–10.5)
nRBC: 0 % (ref 0.0–0.2)

## 2023-08-12 LAB — TSH: TSH: 3.888 u[IU]/mL (ref 0.350–4.500)

## 2023-08-12 MED ORDER — XTAMPZA ER 13.5 MG PO C12A: 1.0000 | EXTENDED_RELEASE_CAPSULE | Freq: Two times a day (BID) | ORAL | 0 refills | Status: DC

## 2023-08-12 MED ORDER — SODIUM CHLORIDE 0.9 % IV SOLN
200.0000 mg | Freq: Once | INTRAVENOUS | Status: AC
  Administered 2023-08-12: 200 mg via INTRAVENOUS
  Filled 2023-08-12: qty 200

## 2023-08-12 MED ORDER — CYANOCOBALAMIN 1000 MCG/ML IJ SOLN
1000.0000 ug | Freq: Once | INTRAMUSCULAR | Status: AC
Start: 2023-08-12 — End: 2023-08-12
  Administered 2023-08-12: 1000 ug via INTRAMUSCULAR
  Filled 2023-08-12: qty 1

## 2023-08-12 MED ORDER — SODIUM CHLORIDE 0.9 % IV SOLN: Freq: Once | INTRAVENOUS | Status: AC

## 2023-08-12 MED ORDER — PROCHLORPERAZINE MALEATE 10 MG PO TABS
10.0000 mg | ORAL_TABLET | Freq: Once | ORAL | Status: AC
  Administered 2023-08-12: 10 mg via ORAL
  Filled 2023-08-12: qty 1

## 2023-08-12 MED ORDER — SODIUM CHLORIDE 0.9 % IV SOLN
500.0000 mg/m2 | Freq: Once | INTRAVENOUS | Status: AC
  Administered 2023-08-12: 900 mg via INTRAVENOUS
  Filled 2023-08-12: qty 20

## 2023-08-12 MED ORDER — OXYCODONE HCL 5 MG PO TABS: 5.0000 mg | ORAL_TABLET | ORAL | 0 refills | Status: DC | PRN

## 2023-08-12 NOTE — Telephone Encounter (Signed)
Received orders from [facility] to re-certify Home Health from period 07/11/2023 to 12/29 2024   Order number [id number]  Fax # [phone] Fax [phone]

## 2023-08-12 NOTE — Telephone Encounter (Signed)
                  -------  Fax Transmission Report-------  To:               Recipient at [phone] Subject:          Fw: Hp Scans Result:           The transmission was successful. Explanation:      All Pages Ok Pages Sent:       9 Connect Time:     5 minutes, 57 seconds Transmit Time:    08/12/2023 10:46 Transfer Rate:    14400 Status Code:      0000 Retry Count:      0 Job Id:           [id number] Unique Id:        [id number] Fax Line:         12 Fax Server:       [employer]

## 2023-08-12 NOTE — Telephone Encounter (Signed)
-------  Fax Transmission Report-------  To:               Recipient at [phone] Subject:          Fw: Hp Scans Result:           The transmission was successful. Explanation:      All Pages Ok Pages Sent:       9 Connect Time:     6 minutes, 6 seconds Transmit Time:    08/12/2023 10:46 Transfer Rate:    14400 Status Code:      0000 Retry Count:      0 Job Id:           [id number] Unique Id:        [id number] Fax Line:         11 Fax Server:       [employer]

## 2023-08-14 ENCOUNTER — Other Ambulatory Visit: Payer: Self-pay | Admitting: Hematology and Oncology

## 2023-08-14 MED ORDER — XTAMPZA ER 13.5 MG PO C12A: 1.0000 | EXTENDED_RELEASE_CAPSULE | Freq: Two times a day (BID) | ORAL | 0 refills | Status: DC

## 2023-08-19 ENCOUNTER — Ambulatory Visit
Admission: RE | Admit: 2023-08-19 | Discharge: 2023-08-19 | Disposition: A | Discharge status: Home / Self Care | Payer: Medicare Other | Source: Ambulatory Visit | Attending: Internal Medicine

## 2023-08-19 DIAGNOSIS — C7931 Secondary malignant neoplasm of brain: Secondary | ICD-10-CM

## 2023-08-19 DIAGNOSIS — Y842 Radiological procedure and radiotherapy as the cause of abnormal reaction of the patient, or of later complication, without mention of misadventure at the time of the procedure: Secondary | ICD-10-CM

## 2023-08-19 MED ORDER — GADOPICLENOL 0.5 MMOL/ML IV SOLN
7.5000 mL | Freq: Once | INTRAVENOUS | Status: AC | PRN
  Administered 2023-08-19: 7.5 mL via INTRAVENOUS

## 2023-08-22 ENCOUNTER — Ambulatory Visit: Payer: Medicare Other | Admitting: Internal Medicine

## 2023-08-23 ENCOUNTER — Ambulatory Visit (HOSPITAL_COMMUNITY)
Admission: RE | Admit: 2023-08-23 | Discharge: 2023-08-23 | Disposition: A | Discharge status: Home / Self Care | Payer: Medicare Other | Source: Ambulatory Visit | Attending: Hematology and Oncology | Admitting: Hematology and Oncology

## 2023-08-23 DIAGNOSIS — C7931 Secondary malignant neoplasm of brain: Secondary | ICD-10-CM | POA: Insufficient documentation

## 2023-08-23 DIAGNOSIS — R072 Precordial pain: Secondary | ICD-10-CM | POA: Diagnosis not present

## 2023-08-23 DIAGNOSIS — I2693 Single subsegmental pulmonary embolism without acute cor pulmonale: Secondary | ICD-10-CM | POA: Diagnosis not present

## 2023-08-23 MED ORDER — IOHEXOL 300 MG/ML  SOLN
100.0000 mL | Freq: Once | INTRAMUSCULAR | Status: AC | PRN
  Administered 2023-08-23: 100 mL via INTRAVENOUS

## 2023-08-26 ENCOUNTER — Inpatient Hospital Stay (HOSPITAL_COMMUNITY)
Admission: EM | Admit: 2023-08-26 | Discharge: 2023-08-29 | DRG: 175 | Disposition: A | Discharge status: Home Health | Payer: Medicare Other | Attending: Internal Medicine | Admitting: Internal Medicine

## 2023-08-26 ENCOUNTER — Emergency Department (HOSPITAL_COMMUNITY): Payer: Medicare Other

## 2023-08-26 ENCOUNTER — Inpatient Hospital Stay (HOSPITAL_COMMUNITY): Payer: Medicare Other

## 2023-08-26 ENCOUNTER — Encounter (HOSPITAL_COMMUNITY): Payer: Self-pay

## 2023-08-26 ENCOUNTER — Other Ambulatory Visit: Payer: Self-pay

## 2023-08-26 ENCOUNTER — Inpatient Hospital Stay: Payer: Medicare Other | Admitting: Internal Medicine

## 2023-08-26 DIAGNOSIS — Z888 Allergy status to other drugs, medicaments and biological substances status: Secondary | ICD-10-CM | POA: Diagnosis not present

## 2023-08-26 DIAGNOSIS — Z515 Encounter for palliative care: Secondary | ICD-10-CM

## 2023-08-26 DIAGNOSIS — C3431 Malignant neoplasm of lower lobe, right bronchus or lung: Secondary | ICD-10-CM | POA: Diagnosis present

## 2023-08-26 DIAGNOSIS — G893 Neoplasm related pain (acute) (chronic): Secondary | ICD-10-CM | POA: Diagnosis present

## 2023-08-26 DIAGNOSIS — Y842 Radiological procedure and radiotherapy as the cause of abnormal reaction of the patient, or of later complication, without mention of misadventure at the time of the procedure: Secondary | ICD-10-CM | POA: Diagnosis present

## 2023-08-26 DIAGNOSIS — Z79899 Other long term (current) drug therapy: Secondary | ICD-10-CM

## 2023-08-26 DIAGNOSIS — Z814 Family history of other substance abuse and dependence: Secondary | ICD-10-CM

## 2023-08-26 DIAGNOSIS — Z86711 Personal history of pulmonary embolism: Secondary | ICD-10-CM

## 2023-08-26 DIAGNOSIS — I10 Essential (primary) hypertension: Secondary | ICD-10-CM | POA: Diagnosis not present

## 2023-08-26 DIAGNOSIS — M549 Dorsalgia, unspecified: Secondary | ICD-10-CM | POA: Diagnosis present

## 2023-08-26 DIAGNOSIS — R072 Precordial pain: Principal | ICD-10-CM

## 2023-08-26 DIAGNOSIS — I2693 Single subsegmental pulmonary embolism without acute cor pulmonale: Secondary | ICD-10-CM | POA: Diagnosis present

## 2023-08-26 DIAGNOSIS — Z8249 Family history of ischemic heart disease and other diseases of the circulatory system: Secondary | ICD-10-CM

## 2023-08-26 DIAGNOSIS — E7800 Pure hypercholesterolemia, unspecified: Secondary | ICD-10-CM | POA: Diagnosis present

## 2023-08-26 DIAGNOSIS — C7931 Secondary malignant neoplasm of brain: Secondary | ICD-10-CM | POA: Diagnosis present

## 2023-08-26 DIAGNOSIS — R0602 Shortness of breath: Secondary | ICD-10-CM

## 2023-08-26 DIAGNOSIS — I5020 Unspecified systolic (congestive) heart failure: Secondary | ICD-10-CM | POA: Diagnosis not present

## 2023-08-26 DIAGNOSIS — I3139 Other pericardial effusion (noninflammatory): Secondary | ICD-10-CM

## 2023-08-26 DIAGNOSIS — C7951 Secondary malignant neoplasm of bone: Secondary | ICD-10-CM | POA: Diagnosis present

## 2023-08-26 DIAGNOSIS — Z86718 Personal history of other venous thrombosis and embolism: Secondary | ICD-10-CM

## 2023-08-26 DIAGNOSIS — I480 Paroxysmal atrial fibrillation: Secondary | ICD-10-CM | POA: Diagnosis present

## 2023-08-26 DIAGNOSIS — Z8674 Personal history of sudden cardiac arrest: Secondary | ICD-10-CM | POA: Diagnosis not present

## 2023-08-26 DIAGNOSIS — F419 Anxiety disorder, unspecified: Secondary | ICD-10-CM | POA: Diagnosis present

## 2023-08-26 DIAGNOSIS — I2699 Other pulmonary embolism without acute cor pulmonale: Secondary | ICD-10-CM | POA: Diagnosis present

## 2023-08-26 DIAGNOSIS — J9601 Acute respiratory failure with hypoxia: Secondary | ICD-10-CM | POA: Diagnosis present

## 2023-08-26 DIAGNOSIS — T66XXXA Radiation sickness, unspecified, initial encounter: Secondary | ICD-10-CM | POA: Diagnosis present

## 2023-08-26 DIAGNOSIS — Z813 Family history of other psychoactive substance abuse and dependence: Secondary | ICD-10-CM

## 2023-08-26 DIAGNOSIS — K219 Gastro-esophageal reflux disease without esophagitis: Secondary | ICD-10-CM | POA: Diagnosis present

## 2023-08-26 DIAGNOSIS — Z87891 Personal history of nicotine dependence: Secondary | ICD-10-CM | POA: Diagnosis not present

## 2023-08-26 DIAGNOSIS — I2609 Other pulmonary embolism with acute cor pulmonale: Secondary | ICD-10-CM | POA: Diagnosis not present

## 2023-08-26 DIAGNOSIS — N1832 Chronic kidney disease, stage 3b: Secondary | ICD-10-CM | POA: Diagnosis present

## 2023-08-26 DIAGNOSIS — Z7901 Long term (current) use of anticoagulants: Secondary | ICD-10-CM

## 2023-08-26 DIAGNOSIS — E118 Type 2 diabetes mellitus with unspecified complications: Secondary | ICD-10-CM | POA: Diagnosis present

## 2023-08-26 DIAGNOSIS — Z993 Dependence on wheelchair: Secondary | ICD-10-CM

## 2023-08-26 DIAGNOSIS — I5022 Chronic systolic (congestive) heart failure: Secondary | ICD-10-CM | POA: Diagnosis present

## 2023-08-26 DIAGNOSIS — Z79891 Long term (current) use of opiate analgesic: Secondary | ICD-10-CM

## 2023-08-26 DIAGNOSIS — D631 Anemia in chronic kidney disease: Secondary | ICD-10-CM | POA: Diagnosis present

## 2023-08-26 DIAGNOSIS — E1122 Type 2 diabetes mellitus with diabetic chronic kidney disease: Secondary | ICD-10-CM | POA: Diagnosis present

## 2023-08-26 DIAGNOSIS — N289 Disorder of kidney and ureter, unspecified: Secondary | ICD-10-CM | POA: Diagnosis not present

## 2023-08-26 DIAGNOSIS — C3490 Malignant neoplasm of unspecified part of unspecified bronchus or lung: Secondary | ICD-10-CM | POA: Diagnosis present

## 2023-08-26 DIAGNOSIS — G8929 Other chronic pain: Secondary | ICD-10-CM | POA: Diagnosis present

## 2023-08-26 DIAGNOSIS — R6510 Systemic inflammatory response syndrome (SIRS) of non-infectious origin without acute organ dysfunction: Secondary | ICD-10-CM | POA: Diagnosis present

## 2023-08-26 DIAGNOSIS — Z7189 Other specified counseling: Secondary | ICD-10-CM | POA: Diagnosis not present

## 2023-08-26 DIAGNOSIS — Z809 Family history of malignant neoplasm, unspecified: Secondary | ICD-10-CM | POA: Diagnosis not present

## 2023-08-26 DIAGNOSIS — I130 Hypertensive heart and chronic kidney disease with heart failure and stage 1 through stage 4 chronic kidney disease, or unspecified chronic kidney disease: Secondary | ICD-10-CM | POA: Diagnosis present

## 2023-08-26 LAB — URINALYSIS, ROUTINE W REFLEX MICROSCOPIC
Bacteria, UA: NONE SEEN
Bilirubin Urine: NEGATIVE
Glucose, UA: NEGATIVE mg/dL
Ketones, ur: NEGATIVE mg/dL
Leukocytes,Ua: NEGATIVE
Nitrite: NEGATIVE
Protein, ur: 30 mg/dL — AB
Specific Gravity, Urine: 1.03 (ref 1.005–1.030)
pH: 6 (ref 5.0–8.0)

## 2023-08-26 LAB — COMPREHENSIVE METABOLIC PANEL
ALT: 41 U/L (ref 0–44)
AST: 35 U/L (ref 15–41)
Albumin: 2.6 g/dL — ABNORMAL LOW (ref 3.5–5.0)
Alkaline Phosphatase: 47 U/L (ref 38–126)
Anion gap: 11 (ref 5–15)
BUN: 39 mg/dL — ABNORMAL HIGH (ref 8–23)
CO2: 28 mmol/L (ref 22–32)
Calcium: 9.2 mg/dL (ref 8.9–10.3)
Chloride: 99 mmol/L (ref 98–111)
Creatinine, Ser: 1.51 mg/dL — ABNORMAL HIGH (ref 0.44–1.00)
GFR, Estimated: 38 mL/min — ABNORMAL LOW (ref >60)
Glucose, Bld: 132 mg/dL — ABNORMAL HIGH (ref 70–99)
Potassium: 3.9 mmol/L (ref 3.5–5.1)
Sodium: 138 mmol/L (ref 135–145)
Total Bilirubin: 0.3 mg/dL (ref <1.2)
Total Protein: 6.5 g/dL (ref 6.5–8.1)

## 2023-08-26 LAB — CBC WITH DIFFERENTIAL/PLATELET
Abs Immature Granulocytes: 0.42 10*3/uL — ABNORMAL HIGH (ref 0.00–0.07)
Basophils Absolute: 0.1 10*3/uL (ref 0.0–0.1)
Basophils Relative: 0 %
Eosinophils Absolute: 0.1 10*3/uL (ref 0.0–0.5)
Eosinophils Relative: 1 %
HCT: 34.1 % — ABNORMAL LOW (ref 36.0–46.0)
Hemoglobin: 11.5 g/dL — ABNORMAL LOW (ref 12.0–15.0)
Immature Granulocytes: 3 %
Lymphocytes Relative: 28 %
Lymphs Abs: 3.6 10*3/uL (ref 0.7–4.0)
MCH: 33.1 pg (ref 26.0–34.0)
MCHC: 33.7 g/dL (ref 30.0–36.0)
MCV: 98.3 fL (ref 80.0–100.0)
Monocytes Absolute: 2.2 10*3/uL — ABNORMAL HIGH (ref 0.1–1.0)
Monocytes Relative: 17 %
Neutro Abs: 6.5 10*3/uL (ref 1.7–7.7)
Neutrophils Relative %: 51 %
Platelets: 261 10*3/uL (ref 150–400)
RBC: 3.47 MIL/uL — ABNORMAL LOW (ref 3.87–5.11)
RDW: 11.7 % (ref 11.5–15.5)
WBC: 12.7 10*3/uL — ABNORMAL HIGH (ref 4.0–10.5)
nRBC: 0 % (ref 0.0–0.2)

## 2023-08-26 LAB — BRAIN NATRIURETIC PEPTIDE: B Natriuretic Peptide: 98.3 pg/mL (ref 0.0–100.0)

## 2023-08-26 LAB — HEPARIN LEVEL (UNFRACTIONATED)
Heparin Unfractionated: 0.16 [IU]/mL — ABNORMAL LOW (ref 0.30–0.70)
Heparin Unfractionated: 0.44 [IU]/mL (ref 0.30–0.70)

## 2023-08-26 LAB — CBC
HCT: 32.5 % — ABNORMAL LOW (ref 36.0–46.0)
Hemoglobin: 10.7 g/dL — ABNORMAL LOW (ref 12.0–15.0)
MCH: 32.4 pg (ref 26.0–34.0)
MCHC: 32.9 g/dL (ref 30.0–36.0)
MCV: 98.5 fL (ref 80.0–100.0)
Platelets: 241 10*3/uL (ref 150–400)
RBC: 3.3 MIL/uL — ABNORMAL LOW (ref 3.87–5.11)
RDW: 11.6 % (ref 11.5–15.5)
WBC: 14.5 10*3/uL — ABNORMAL HIGH (ref 4.0–10.5)
nRBC: 0 % (ref 0.0–0.2)

## 2023-08-26 LAB — TROPONIN I (HIGH SENSITIVITY)
Troponin I (High Sensitivity): 14 ng/L (ref <18)
Troponin I (High Sensitivity): 14 ng/L (ref <18)

## 2023-08-26 MED ORDER — IOHEXOL 350 MG/ML SOLN
50.0000 mL | Freq: Once | INTRAVENOUS | Status: AC | PRN
  Administered 2023-08-26: 50 mL via INTRAVENOUS

## 2023-08-26 MED ORDER — ONDANSETRON HCL 4 MG PO TABS: 4.0000 mg | ORAL_TABLET | Freq: Four times a day (QID) | ORAL | Status: DC | PRN

## 2023-08-26 MED ORDER — FENTANYL CITRATE PF 50 MCG/ML IJ SOSY
50.0000 ug | PREFILLED_SYRINGE | Freq: Once | INTRAMUSCULAR | Status: AC
  Administered 2023-08-26: 50 ug via INTRAVENOUS
  Filled 2023-08-26: qty 1

## 2023-08-26 MED ORDER — HYDROMORPHONE HCL 1 MG/ML IJ SOLN
0.5000 mg | INTRAMUSCULAR | Status: DC | PRN
  Administered 2023-08-26 – 2023-08-27 (×7): 0.5 mg via INTRAVENOUS
  Filled 2023-08-26: qty 0.5
  Filled 2023-08-26: qty 1
  Filled 2023-08-26 (×5): qty 0.5
  Filled 2023-08-26: qty 1

## 2023-08-26 MED ORDER — SODIUM CHLORIDE 0.9% FLUSH
3.0000 mL | Freq: Two times a day (BID) | INTRAVENOUS | Status: DC
  Administered 2023-08-26 – 2023-08-29 (×6): 3 mL via INTRAVENOUS

## 2023-08-26 MED ORDER — OXYCODONE HCL ER 15 MG PO T12A
15.0000 mg | EXTENDED_RELEASE_TABLET | Freq: Two times a day (BID) | ORAL | Status: DC
  Administered 2023-08-26 – 2023-08-29 (×7): 15 mg via ORAL
  Filled 2023-08-26 (×7): qty 1

## 2023-08-26 MED ORDER — ONDANSETRON HCL 4 MG/2ML IJ SOLN
4.0000 mg | Freq: Once | INTRAMUSCULAR | Status: AC
  Administered 2023-08-26: 4 mg via INTRAVENOUS
  Filled 2023-08-26: qty 2

## 2023-08-26 MED ORDER — ACETAMINOPHEN 325 MG PO TABS
650.0000 mg | ORAL_TABLET | Freq: Four times a day (QID) | ORAL | Status: DC | PRN
Start: 2023-08-26 — End: 2023-08-29

## 2023-08-26 MED ORDER — HEPARIN (PORCINE) 25000 UT/250ML-% IV SOLN
1100.0000 [IU]/h | INTRAVENOUS | Status: DC
  Administered 2023-08-26: 900 [IU]/h via INTRAVENOUS
  Administered 2023-08-27: 1050 [IU]/h via INTRAVENOUS
  Administered 2023-08-28: 1100 [IU]/h via INTRAVENOUS
  Filled 2023-08-26 (×3): qty 250

## 2023-08-26 MED ORDER — NALOXONE HCL 0.4 MG/ML IJ SOLN: 0.4000 mg | INTRAMUSCULAR | Status: DC | PRN

## 2023-08-26 MED ORDER — ONDANSETRON HCL 4 MG/2ML IJ SOLN
4.0000 mg | Freq: Four times a day (QID) | INTRAMUSCULAR | Status: DC | PRN
  Administered 2023-08-26 – 2023-08-27 (×2): 4 mg via INTRAVENOUS
  Filled 2023-08-26 (×2): qty 2

## 2023-08-26 MED ORDER — OXYCODONE HCL 5 MG PO TABS
5.0000 mg | ORAL_TABLET | ORAL | Status: DC | PRN
  Administered 2023-08-27 – 2023-08-28 (×2): 5 mg via ORAL
  Filled 2023-08-26 (×2): qty 1

## 2023-08-26 MED ORDER — IOHEXOL 350 MG/ML SOLN: 60.0000 mL | Freq: Once | INTRAVENOUS | Status: DC | PRN

## 2023-08-26 MED ORDER — ACETAMINOPHEN 650 MG RE SUPP: 650.0000 mg | Freq: Four times a day (QID) | RECTAL | Status: DC | PRN

## 2023-08-26 MED ORDER — ALPRAZOLAM 0.25 MG PO TABS: 0.2500 mg | ORAL_TABLET | Freq: Two times a day (BID) | ORAL | Status: DC | PRN

## 2023-08-26 NOTE — ED Notes (Signed)
ED TO INPATIENT HANDOFF REPORT  ED Nurse Name and Phone #: Theophilus Bones [phone]  S Name/Age/Gender Virginia [name] 67 y.o. female Room/Bed: 037C/037C  Code Status   Code Status: Full Code  Home/SNF/Other Home Patient oriented to: self, place, time, and situation Is this baseline?  Pt also noted with periods of confusion at thime  Triage Complete: Triage complete  Chief Complaint Acute pulmonary embolism (HCC) [I26.99]  Triage Note Pt came from home with chest pain experiencing it for 2 hours. Pt experiencing nausea vomiting with difficulty breathing. History of cardiac arrest in October 2024.   Allergies Allergies  Allergen Reactions   Scopolamine Rash    Level of Care/Admitting Diagnosis ED Disposition     ED Disposition  Admit   Condition  --   Comment  Hospital Area: [facility] [[id number]]  Level of Care: Progressive [102]  Admit to Progressive based on following criteria: MULTISYSTEM THREATS such as stable sepsis, metabolic/electrolyte imbalance with or without encephalopathy that is responding to early treatment.  May admit patient to [facility] or [facility] if equivalent level of care is available:: No  Covid Evaluation: Asymptomatic - no recent exposure (last 10 days) testing not required  Diagnosis: Acute pulmonary embolism [facility]) [[id number]]  Admitting Physician: Angie Fava [[id number]]  Attending Physician: Angie Fava [[id number]]  Certification:: I certify this patient will need inpatient services for at least 2 midnights  Expected Medical Readiness: 08/28/2023          B Medical/Surgery History Past Medical History:  Diagnosis Date   Anemia    Anxiety    Concussion 09/28/2019   DVT (deep venous thrombosis) (HCC) 2021   L leg   Dyspnea    GERD (gastroesophageal reflux disease)    Hypercholesterolemia    per pt, she does not have elevated lipids   Hypertension    met lung ca dx'd 09/2019   mets to spine, hip and  brain   PONV (postoperative nausea and vomiting)    Tobacco abuse    Past Surgical History:  Procedure Laterality Date   ABDOMINAL HYSTERECTOMY     partial/ left ovaries   APPLICATION OF CRANIAL NAVIGATION N/A 12/02/2020   Procedure: APPLICATION OF CRANIAL NAVIGATION;  Surgeon: Maeola Harman, MD;  Location: [facility] OR;  Service: Neurosurgery;  Laterality: N/A;   CHOLECYSTECTOMY     CRANIOTOMY N/A 12/02/2020   Procedure: Posterior fossa craniotomy for tumor resection with brainlab;  Surgeon: Maeola Harman, MD;  Location: [facility] OR;  Service: Neurosurgery;  Laterality: N/A;   DILATION AND CURETTAGE OF UTERUS     IR IMAGING GUIDED PORT INSERTION  10/23/2019   IR PATIENT EVAL TECH 0-60 MINS  11/21/2021   IR PATIENT EVAL TECH 0-60 MINS  11/24/2021   IR PATIENT EVAL TECH 0-60 MINS  11/29/2021   IR PATIENT EVAL TECH 0-60 MINS  12/04/2021   IR PATIENT EVAL TECH 0-60 MINS  12/12/2021   IR PATIENT EVAL TECH 0-60 MINS  12/19/2021   IR PATIENT EVAL TECH 0-60 MINS  12/27/2021   IR PATIENT EVAL TECH 0-60 MINS  01/03/2022   IR PATIENT EVAL TECH 0-60 MINS  01/10/2022   IR PATIENT EVAL TECH 0-60 MINS  01/18/2022   IR PATIENT EVAL TECH 0-60 MINS  02/13/2022   IR PATIENT EVAL TECH 0-60 MINS  02/20/2022   IR RADIOLOGIST EVAL & MGMT  01/23/2022   IR REMOVAL TUN ACCESS W/ PORT W/O FL MOD SED  11/17/2021   KYPHOPLASTY N/A  03/15/2020   Procedure: Thoracic Eight KYPHOPLASTY;  Surgeon: Maeola Harman, MD;  Location: [facility] OR;  Service: Neurosurgery;  Laterality: N/A;  prone    LIPOMA EXCISION  2018   removed under left breast and right thigh.   TUBAL LIGATION       A IV Location/Drains/Wounds Patient Lines/Drains/Airways Status     Active Line/Drains/Airways     Name Placement date Placement time Site Days   Peripheral IV 08/26/23 20 G Right Antecubital 08/26/23  0036  Antecubital  less than 1   Pressure Injury 06/03/23 Buttocks Right Deep Tissue Pressure Injury - Purple or maroon localized area of discolored intact skin or  blood-filled blister due to damage of underlying soft tissue from pressure and/or shear. purple 06/03/23  1740  -- 84   Pressure Injury 06/03/23 Buttocks Left Deep Tissue Pressure Injury - Purple or maroon localized area of discolored intact skin or blood-filled blister due to damage of underlying soft tissue from pressure and/or shear. 06/03/23  --  -- 84            Intake/Output Last 24 hours  Intake/Output Summary (Last 24 hours) at 08/26/2023 1109 Last data filed at 08/26/2023 0530 Gross per 24 hour  Intake 240 ml  Output --  Net 240 ml    Labs/Imaging Results for orders placed or performed during the hospital encounter of 08/26/23 (from the past 48 hours)  Comprehensive metabolic panel     Status: Abnormal   Collection Time: 08/26/23 12:53 AM  Result Value Ref Range   Sodium 138 135 - 145 mmol/L   Potassium 3.9 3.5 - 5.1 mmol/L   Chloride 99 98 - 111 mmol/L   CO2 28 22 - 32 mmol/L   Glucose, Bld 132 (H) 70 - 99 mg/dL    Comment: Glucose reference range applies only to samples taken after fasting for at least 8 hours.   BUN 39 (H) 8 - 23 mg/dL   Creatinine, Ser 3.66 (H) 0.44 - 1.00 mg/dL   Calcium 9.2 8.9 - 44.0 mg/dL   Total Protein 6.5 6.5 - 8.1 g/dL   Albumin 2.6 (L) 3.5 - 5.0 g/dL   AST 35 15 - 41 U/L   ALT 41 0 - 44 U/L   Alkaline Phosphatase 47 38 - 126 U/L   Total Bilirubin 0.3 <1.2 mg/dL   GFR, Estimated 38 (L) >60 mL/min    Comment: (NOTE) Calculated using the CKD-EPI Creatinine Equation (2021)    Anion gap 11 5 - 15    Comment: Performed at [facility], [address], [place], [place] [address]  Troponin I (High Sensitivity)     Status: None   Collection Time: 08/26/23 12:53 AM  Result Value Ref Range   Troponin I (High Sensitivity) 14 <18 ng/L    Comment: (NOTE) Elevated high sensitivity troponin I (hsTnI) values and significant  changes across serial measurements may suggest ACS but many other  chronic and acute conditions are known to  elevate hsTnI results.  Refer to the "Links" section for chest pain algorithms and additional  guidance. Performed at [facility], [address], [place], [place] [address]   CBC with Differential     Status: Abnormal   Collection Time: 08/26/23 12:53 AM  Result Value Ref Range   WBC 12.7 (H) 4.0 - 10.5 K/uL   RBC 3.47 (L) 3.87 - 5.11 MIL/uL   Hemoglobin 11.5 (L) 12.0 - 15.0 g/dL   HCT 87.5 (L) 64.3 -  46.0 %   MCV 98.3 80.0 - 100.0 fL   MCH 33.1 26.0 - 34.0 pg   MCHC 33.7 30.0 - 36.0 g/dL   RDW 74.2 59.5 - 63.8 %   Platelets 261 150 - 400 K/uL   nRBC 0.0 0.0 - 0.2 %   Neutrophils Relative % 51 %   Neutro Abs 6.5 1.7 - 7.7 K/uL   Lymphocytes Relative 28 %   Lymphs Abs 3.6 0.7 - 4.0 K/uL   Monocytes Relative 17 %   Monocytes Absolute 2.2 (H) 0.1 - 1.0 K/uL   Eosinophils Relative 1 %   Eosinophils Absolute 0.1 0.0 - 0.5 K/uL   Basophils Relative 0 %   Basophils Absolute 0.1 0.0 - 0.1 K/uL   Immature Granulocytes 3 %   Abs Immature Granulocytes 0.42 (H) 0.00 - 0.07 K/uL    Comment: Performed at [facility], [address] [place], [place] [address]  Brain natriuretic peptide     Status: None   Collection Time: 08/26/23 12:53 AM  Result Value Ref Range   B Natriuretic Peptide 98.3 0.0 - 100.0 pg/mL    Comment: Performed at [facility], [address] [place], [place] [address]  Troponin I (High Sensitivity)     Status: None   Collection Time: 08/26/23  3:31 AM  Result Value Ref Range   Troponin I (High Sensitivity) 14 <18 ng/L    Comment: (NOTE) Elevated high sensitivity troponin I (hsTnI) values and significant  changes across serial measurements may suggest ACS but many other  chronic and acute conditions are known to elevate hsTnI results.  Refer to the "Links" section for chest pain algorithms and additional  guidance. Performed at [facility], [address] [place], [place] [address]   CBC     Status: Abnormal   Collection Time:  08/26/23  5:06 AM  Result Value Ref Range   WBC 14.5 (H) 4.0 - 10.5 K/uL   RBC 3.30 (L) 3.87 - 5.11 MIL/uL   Hemoglobin 10.7 (L) 12.0 - 15.0 g/dL   HCT 60.6 (L) 30.1 - 60.1 %   MCV 98.5 80.0 - 100.0 fL   MCH 32.4 26.0 - 34.0 pg   MCHC 32.9 30.0 - 36.0 g/dL   RDW 09.3 23.5 - 57.3 %   Platelets 241 150 - 400 K/uL   nRBC 0.0 0.0 - 0.2 %    Comment: Performed at [facility], [address] [place], [place] [address]   *Note: Due to a large number of results and/or encounters for the requested time period, some results have not been displayed. A complete set of results can be found in Results Review.   CT Angio Chest PE W/Cm &/Or Wo Cm Result Date: 08/26/2023 CLINICAL DATA:  Pulmonary embolism, chest pain EXAM: CT ANGIOGRAPHY CHEST WITH CONTRAST TECHNIQUE: Multidetector CT imaging of the chest was performed using the standard protocol during bolus administration of intravenous contrast. Multiplanar CT image reconstructions and MIPs were obtained to evaluate the vascular anatomy. RADIATION DOSE REDUCTION: This exam was performed according to the departmental dose-optimization program which includes automated exposure control, adjustment of the mA and/or kV according to patient size and/or use of iterative reconstruction technique. CONTRAST:  50mL OMNIPAQUE IOHEXOL 350 MG/ML SOLN COMPARISON:  08/23/2023 FINDINGS: Cardiovascular: There is adequate opacification of the pulmonary arterial tree. Several small intraluminal filling defects are identified within the basal segmental pulmonary arteries of the right lower lobe in keeping with acute  pulmonary emboli. The embolic burden is small. The central pulmonary arteries are of normal caliber. There is mild global cardiomegaly with left ventricular enlargement. No CT evidence of right heart strain. Mild coronary artery calcification. Small pericardial effusion has increased in size since prior examination. Moderate atherosclerotic calcification within  the thoracic aorta. No aortic aneurysm. Mediastinum/Nodes: No enlarged mediastinal, hilar, or axillary lymph nodes. Thyroid gland, trachea, and esophagus demonstrate no significant findings. Lungs/Pleura: Bibasilar atelectasis with lateral segmental collapse of the right lower lobe. Stable post treatment changes within the medial a right lower lobe abutting the pleural surface and extending into the azygoesophageal recess. A trace right pleural effusion. No confluent pulmonary infiltrate. No pneumothorax. No central obstructing lesion. Upper Abdomen: No acute abnormality. Musculoskeletal: T8 vertebroplasty. No acute bone abnormality. Mixed lytic and sclerotic lesion within the mid body of the sternum suspicious for a solitary osseous metastasis. This appears new since more remote prior examination of 06/03/2023 Review of the MIP images confirms the above findings. IMPRESSION: 1. Acute pulmonary emboli within the basal segmental pulmonary arteries of the right lower lobe. The embolic burden is small. No CT evidence of right heart strain. 2. Trace right pleural effusion, possibly reactive in nature. 3. Mild global cardiomegaly with left ventricular enlargement. Mild coronary artery calcification. 4. Small pericardial effusion, increased in size since prior examination. 5. Stable post treatment changes within the medial right lower lobe. 6. Mixed lytic and sclerotic lesion within the mid body of the sternum suspicious for a solitary osseous metastasis. This appears new since more remote prior examination of 06/03/2023. PET CT examination may be helpful for further evaluation. Aortic Atherosclerosis (ICD10-I70.0). Electronically Signed   By: Helyn Numbers M.D.   On: 08/26/2023 03:55   DG Chest Port 1 View Result Date: 08/26/2023 CLINICAL DATA:  Chest pain EXAM: PORTABLE CHEST 1 VIEW COMPARISON:  Radiographs 06/28/2023 and CT 08/23/2023 FINDINGS: Stable cardiomegaly. Aortic atherosclerotic calcification. Bibasilar  atelectasis/scarring. Otherwise no focal consolidation. No pleural effusion or pneumothorax. Vertebroplasty of a midthoracic vertebral body. No displaced rib fractures. IMPRESSION: No active disease. Cardiomegaly. Electronically Signed   By: Minerva Fester M.D.   On: 08/26/2023 01:26    Pending Labs Unresulted Labs (From admission, onward)     Start     Ordered   08/27/23 0500  Heparin level (unfractionated)  Daily,   R      08/26/23 0447   08/26/23 1100  Heparin level (unfractionated)  Once-Timed,   URGENT        08/26/23 0447   08/26/23 0743  Urinalysis, Routine w reflex microscopic -Urine, Clean Catch  Once,   R       Question:  Specimen Source  Answer:  Urine, Clean Catch   08/26/23 0742   08/26/23 0500  CBC  Daily,   R      08/26/23 0447            Vitals/Pain Today's Vitals   08/26/23 0514 08/26/23 0617 08/26/23 0700 08/26/23 1005  BP:   (!) 125/104 113/84  Pulse:   99 (!) 38  Resp:   20 (!) 22  Temp:    98 F (36.7 C)  TempSrc:      SpO2:   94% 90%  Weight:      Height:      PainSc: 10-Worst pain ever 9       Isolation Precautions No active isolations  Medications Medications  iohexol (OMNIPAQUE) 350 MG/ML injection 60 mL (has no administration in time range)  heparin ADULT infusion 100 units/mL (25000 units/243mL) (900 Units/hr Intravenous New Bag/Given 08/26/23 0503)  acetaminophen (TYLENOL) tablet 650 mg (has no administration in time range)    Or  acetaminophen (TYLENOL) suppository 650 mg (has no administration in time range)  naloxone (NARCAN) injection 0.4 mg (has no administration in time range)  HYDROmorphone (DILAUDID) injection 0.5 mg (has no administration in time range)  oxyCODONE (Oxy IR/ROXICODONE) immediate release tablet 5 mg (has no administration in time range)  oxyCODONE (OXYCONTIN) 12 hr tablet 15 mg (15 mg Oral Given 08/26/23 0956)  ALPRAZolam (XANAX) tablet 0.25 mg (has no administration in time range)  sodium chloride flush (NS) 0.9  % injection 3 mL (3 mLs Intravenous Not Given 08/26/23 0956)  ondansetron (ZOFRAN) tablet 4 mg (has no administration in time range)    Or  ondansetron (ZOFRAN) injection 4 mg (has no administration in time range)  fentaNYL (SUBLIMAZE) injection 50 mcg (50 mcg Intravenous Given 08/26/23 0215)  ondansetron (ZOFRAN) injection 4 mg (4 mg Intravenous Given 08/26/23 0238)  iohexol (OMNIPAQUE) 350 MG/ML injection 50 mL (50 mLs Intravenous Contrast Given 08/26/23 0317)  fentaNYL (SUBLIMAZE) injection 50 mcg (50 mcg Intravenous Given 08/26/23 0534)    Mobility non-ambulatory     Focused Assessments Cardiac Assessment Handoff:  Cardiac Rhythm: Normal sinus rhythm Lab Results  Component Value Date   CKTOTAL 368 (H) 06/04/2023   Lab Results  Component Value Date   DDIMER 3.62 (H) 06/08/2023   Does the Patient currently have chest pain? No    R Recommendations: See Admitting Provider Note  Report given to:   Additional Notes:

## 2023-08-26 NOTE — Progress Notes (Signed)
PHARMACY - ANTICOAGULATION CONSULT NOTE  Pharmacy Consult for IV heparin Indication: pulmonary embolus  Allergies  Allergen Reactions   Scopolamine Rash    Patient Measurements: Height: 5\' 2"  (157.5 cm) Weight: 70.7 kg (155 lb 13.8 oz) IBW/kg (Calculated) : 50.1 Heparin Dosing Weight: 65 kg  Vital Signs: BP: 126/82 (12/16 0400) Pulse Rate: 94 (12/16 0400)  Labs: Recent Labs    08/26/23 0053 08/26/23 0331  HGB 11.5*  --   HCT 34.1*  --   PLT 261  --   CREATININE 1.51*  --   TROPONINIHS 14 14    Estimated Creatinine Clearance: 33.7 mL/min (A) (by C-G formula based on SCr of 1.51 mg/dL (H)).   Medical History: Past Medical History:  Diagnosis Date   Anemia    Anxiety    Concussion 09/28/2019   DVT (deep venous thrombosis) (HCC) 2021   L leg   Dyspnea    GERD (gastroesophageal reflux disease)    Hypercholesterolemia    per pt, she does not have elevated lipids   Hypertension    met lung ca dx'd 09/2019   mets to spine, hip and brain   PONV (postoperative nausea and vomiting)    Tobacco abuse     Assessment: Virginia [name] is a 67 y.o. year old female presents on 08/26/2023 with shortness of breath and chest pain now with concern for new PE. PMH significant for metastatic non-small cell lung cancer with evidence of progression of brain mets per last MRI. CT on 12/16 with small segmental PE of the right lower lobe with right heart strain. No anticoagulation prior to admission. Previously on eliquis in 2021 for DVT which was discontinued due to cerebral hemorrhage. Pt is very high risk for anticoagulation which was discussed with family and patient at bedside. Will utilize a very conservative dosing strategies with lower heparin goal and no bolus per discussion with EDP.   Goal of Therapy:  Heparin level 0.3-0.5 units/ml Monitor platelets by anticoagulation protocol: Yes   Plan:  Heparin infusion at 900 units/hr (no bolus)  6 heparin level  Daily heparin  level, CBC, and monitoring for bleeding F/u plans for anticoagulation including GOC  Thank you for allowing pharmacy to participate in this patient's care.  Marja Kays, PharmD Emergency Medicine Clinical Pharmacist 08/26/2023,4:45 AM

## 2023-08-26 NOTE — H&P (Signed)
History and Physical    Patient: Virginia [name] UEA:[id number] DOB: 11/04/1955 DOA: 08/26/2023 DOS: the patient was seen and examined on 08/26/2023 PCP: [name], MD  Patient coming from: Home  Chief Complaint:  Chief Complaint  Patient presents with   Chest Pain   HPI: Virginia [name] is a 67 y.o. female with medical history significant of hypertension, hyperlipidemia, metastatic non-small cell lung cancer with metastasis brain on chemotherapy, anemia, cardiac arrest secondary to VF, history of DVT longer on anticoagulation due to intracranial bleeding, anxiety, tobacco abuse, and GERD who presented with complaints of Sudden onset of chest and back pain that started after dinner the previous night around 8:30 or 9 PM. The pain was described as stabbing and shooting, radiating to his navel, shoulders, and back. The pain worsens on inspiration, causing the patient to take short, shallow breaths.  She denies any headaches, vision changes, or focal weakness.  Does report having lower extremity swelling  for the past 2-3 days, but has not had any significant leg pain.  The patient has a known compression fracture in his spine and is currently taking oxycodone for pain management, but reports that it is not helping with her current symptoms.  Outside of the room patient's husband makes note  Patient just recently had MRI of the brain that showed disease progression with increased size most of the previously seen enhancing lesion in the supratentorial compartment with mild progress vasogenic edema surrounding the largest lesion, stable size and infratentorial lesions with progression of the associated to T2 hyperintensity involving the entire cerebellum, and 3 punctate foci of restricted diffusion in the bilateral cerebellar hemispheres thought to represent small recent infarcts.  They had an appointment scheduled today with Dr. Barbaraann Cao to go over the results.  In the emergency department  patient was noted to have temperature of 100.7 F with pulse elevated up to 104, respirations 24-36, blood pressures maintained, and O2 saturations noted to be as low as 81% with improvement on 2 L nasal cannula oxygen.  Labs significant for WBC elevated up to 14.5, hemoglobin 10.7 BUN 39, creatinine 1.51, glucose 132, BNP 98.3, and high-sensitivity troponins negative x 2.  Chest x-ray noted cardiomegaly without acute abnormality.  CT angiogram of the chest was obtained which noted acute pulmonary emboli within the basal segmental arteries of the right lower lobe without signs of right heart strain, trace right pleural effusion, mild global cardiomegaly with left ventricular enlargement,  small pericardial effusion increased in size since prior exam, and mixed lytic and sclerotic lesion of the sternum concerning for solitary osseous metastasis.  Patient had been started on heparin drip without bolus and given fentanyl IV for pain.  Review of Systems: As mentioned in the history of present illness. All other systems reviewed and are negative. Past Medical History:  Diagnosis Date   Anemia    Anxiety    Concussion 09/28/2019   DVT (deep venous thrombosis) (HCC) 2021   L leg   Dyspnea    GERD (gastroesophageal reflux disease)    Hypercholesterolemia    per pt, she does not have elevated lipids   Hypertension    met lung ca dx'd 09/2019   mets to spine, hip and brain   PONV (postoperative nausea and vomiting)    Tobacco abuse    Past Surgical History:  Procedure Laterality Date   ABDOMINAL HYSTERECTOMY     partial/ left ovaries   APPLICATION OF CRANIAL NAVIGATION N/A 12/02/2020   Procedure: APPLICATION  OF CRANIAL NAVIGATION;  Surgeon: Maeola Harman, MD;  Location: [facility] OR;  Service: Neurosurgery;  Laterality: N/A;   CHOLECYSTECTOMY     CRANIOTOMY N/A 12/02/2020   Procedure: Posterior fossa craniotomy for tumor resection with brainlab;  Surgeon: Maeola Harman, MD;  Location: [facility] OR;  Service:  Neurosurgery;  Laterality: N/A;   DILATION AND CURETTAGE OF UTERUS     IR IMAGING GUIDED PORT INSERTION  10/23/2019   IR PATIENT EVAL TECH 0-60 MINS  11/21/2021   IR PATIENT EVAL TECH 0-60 MINS  11/24/2021   IR PATIENT EVAL TECH 0-60 MINS  11/29/2021   IR PATIENT EVAL TECH 0-60 MINS  12/04/2021   IR PATIENT EVAL TECH 0-60 MINS  12/12/2021   IR PATIENT EVAL TECH 0-60 MINS  12/19/2021   IR PATIENT EVAL TECH 0-60 MINS  12/27/2021   IR PATIENT EVAL TECH 0-60 MINS  01/03/2022   IR PATIENT EVAL TECH 0-60 MINS  01/10/2022   IR PATIENT EVAL TECH 0-60 MINS  01/18/2022   IR PATIENT EVAL TECH 0-60 MINS  02/13/2022   IR PATIENT EVAL TECH 0-60 MINS  02/20/2022   IR RADIOLOGIST EVAL & MGMT  01/23/2022   IR REMOVAL TUN ACCESS W/ PORT W/O FL MOD SED  11/17/2021   KYPHOPLASTY N/A 03/15/2020   Procedure: Thoracic Eight KYPHOPLASTY;  Surgeon: Maeola Harman, MD;  Location: [facility] OR;  Service: Neurosurgery;  Laterality: N/A;  prone    LIPOMA EXCISION  2018   removed under left breast and right thigh.   TUBAL LIGATION     Social History:  reports that she quit smoking about 10 years ago. Her smoking use included cigarettes. She has never used smokeless tobacco. She reports current alcohol use. She reports that she does not use drugs.  Allergies  Allergen Reactions   Scopolamine Rash    Family History  Problem Relation Age of Onset   Heart disease Father    Drug abuse Daughter    Drug abuse Son    Cancer Sister    Heart disease Brother    Heart attack Brother     Prior to Admission medications   Medication Sig Start Date End Date Taking? Authorizing Provider  acetaminophen (TYLENOL) 325 MG tablet Take 1-2 tablets (325-650 mg total) by mouth every 4 (four) hours as needed for mild pain (pain score 1-3). 07/01/23   Setzer, Lynnell Jude, [name]  albuterol (VENTOLIN HFA) 108 (90 Base) MCG/ACT inhaler INHALE 2 PUFFS INTO THE LUNGS EVERY 6 HOURS AS NEEDED FOR WHEEZING OR SHORTNESS OF BREATH 11/23/22   [name], MD   ALPRAZolam Prudy Feeler) 0.25 MG tablet Take 1 tablet (0.25 mg total) by mouth 2 (two) times daily as needed for anxiety. 07/01/23   Setzer, Lynnell Jude, [name]  bisacodyl (DULCOLAX) 5 MG EC tablet Take 1 tablet (5 mg total) by mouth daily as needed for moderate constipation. 07/01/23   Setzer, Lynnell Jude, [name]  Cyanocobalamin (VITAMIN B12 PO) Take 1 tablet by mouth daily. Gummies    [provider]  dexamethasone (DECADRON) 1 MG tablet Take 1 tablet (1 mg total) by mouth daily. 07/29/23   Henreitta Leber, MD  feeding supplement (ENSURE ENLIVE / ENSURE PLUS) LIQD Take 237 mLs by mouth 2 (two) times daily between meals. 07/01/23   Setzer, Lynnell Jude, [name]  hydrocortisone (ANUSOL-HC) 2.5 % rectal cream use in rectal area 2 to 3 times a day as needed Patient taking differently: Place 1 Application rectally daily as needed for hemorrhoids. 05/24/23  [name], MD  levETIRAcetam (KEPPRA) 1000 MG tablet Take 1 tablet (1,000 mg total) by mouth 2 (two) times daily. 07/29/23   Henreitta Leber, MD  LYSINE PO Take 2 tablets by mouth daily.    [provider]  magnesium oxide (MAG-OX) 400 (240 Mg) MG tablet Take 1 tablet (400 mg total) by mouth 2 (two) times daily. 07/01/23   Setzer, Lynnell Jude, [name]  meclizine (ANTIVERT) 50 MG tablet Take 1 tablet (50 mg total) by mouth 3 (three) times daily. 07/29/23   Henreitta Leber, MD  metoprolol succinate (TOPROL-XL) 25 MG 24 hr tablet Take 1 tablet (25 mg total) by mouth daily. 07/26/23   Jodelle Gross, NP  Multiple Vitamin (MULTIVITAMIN WITH MINERALS) TABS tablet Take 1 tablet by mouth daily.    [provider]  Nutritional Supplements (,FEEDING SUPPLEMENT, PROSOURCE PLUS) liquid Take 30 mLs by mouth 2 (two) times daily between meals. 07/01/23   Setzer, Lynnell Jude, [name]  OLANZapine (ZYPREXA) 2.5 MG tablet Take 1 tablet (2.5 mg total) by mouth at bedtime. 07/29/23   Ulysees Barns IV, MD  ondansetron (ZOFRAN) 4 MG tablet TAKE 1 TABLET(4 MG) BY  MOUTH EVERY 8 HOURS AS NEEDED FOR NAUSEA OR VOMITING 01/30/23   Jaci Standard, MD  OVER THE COUNTER MEDICATION Take 2 tablets by mouth daily. Beet root    [provider]  oxyCODONE (OXY IR/ROXICODONE) 5 MG immediate release tablet Take 1 tablet (5 mg total) by mouth every 4 (four) hours as needed for severe pain (pain score 7-10). 08/12/23   Jaci Standard, MD  pantoprazole (PROTONIX) 40 MG tablet Take 1 tablet (40 mg total) by mouth daily. 09/05/22   [name], MD  polyethylene glycol (MIRALAX / GLYCOLAX) 17 g packet Take 17 g by mouth every evening.    [provider]  potassium chloride SA (KLOR-CON M) 20 MEQ tablet Take 1 tablet (20 mEq total) by mouth 2 (two) times daily. 07/29/23   Jaci Standard, MD  senna-docusate (SENOKOT-S) 8.6-50 MG tablet Take 2 tablets by mouth daily as needed (Patient preference, Sennakot S for mild constipation). 07/01/23   Setzer, Lynnell Jude, [name]  spironolactone (ALDACTONE) 25 MG tablet Take 1 tablet (25 mg total) by mouth daily. 07/26/23   Jodelle Gross, NP  Tetrahydrozoline HCl (VISINE OP) Place 1 drop into both eyes daily as needed (irritation).    [provider]  VITAMIN D PO Take 1 capsule by mouth every evening.    [provider]  XTAMPZA ER 13.5 MG C12A Take 1 capsule by mouth in the morning and at bedtime. (every 12 hours) 08/14/23 02/10/24  Jaci Standard, MD    Physical Exam: Vitals:   08/26/23 0200 08/26/23 0400 08/26/23 0507 08/26/23 0700  BP: 126/86 126/82  (!) 125/104  Pulse: 88 94  (!) 141  Resp: (!) 36 (!) 24  20  Temp:   (!) 100.7 F (38.2 C)   TempSrc:   Oral   SpO2: 94% 99%    Weight:  70.7 kg    Height:  5\' 2"  (1.575 m)      Constitutional: Elderly female who appears chronically ill. Eyes: PERRL, lids and conjunctivae normal ENMT: Mucous membranes are moist. Posterior pharynx clear of any exudate or lesions.Normal dentition.  Neck: normal, supple, no masses, no  thyromegaly Respiratory: Decreased overall air ration with some crackles heard in the lower lung fields.  Patient currently on  2 L of nasal cannula oxygen with O2 saturations maintained. Cardiovascular: Regular rate and rhythm, no murmurs / rubs / gallops.  1+ pitting edema of the lower extremities appreciated Abdomen: no tenderness, no masses palpated.Bowel sounds positive.  Musculoskeletal: no clubbing / cyanosis. No joint deformity upper and lower extremities. Good ROM, no contractures. Normal muscle tone.  Skin: no rashes, lesions, ulcers. No induration Neurologic: CN 2-12 grossly intact.  4/5 in upper and lower extremities Psychiatric: Normal judgment and insight. Alert and oriented x 3. Normal mood.   Data Reviewed:  EKG revealed possible ectopic atrial rhythm at 88 bpm with nonspecific intraventricular conduction delay.  Reviewed labs, imaging, and pertinent records as documented.  Assessment and Plan:  Acute respiratory failure with hypoxia Pulmonary embolism Patient presented with complaints of chest pain.  High-sensitivity troponins were negative x 2 and EKG noted what appeared to be a ectopic atrial rhythm at 88 bpm.  O2 saturations were noted to range as low as 81% on room air, and patient was started on nasal cannula oxygen.  Given history a CT angiogram of the chest have been obtained and noted to show  an acute pulmonary emboli within the basal segmental arteries of the right lower lobe without signs of right heart strain.  She had been started on a heparin drip without bolus. -Admit to a progressive bed -Continuous pulse oximetry with oxygen to maintain O2 saturations greater than 92% -Incentive spirometry -Continue heparin drip per pharmacy  -Check Doppler ultrasound of the lower extremities -Deferred to Dr. [name] and Dr. Barbaraann Cao in regards to continue anticoagulation versus IVC filter  SIRS On admission patient was noted to be tachypneic and tachycardic with WBC elevated  up to 14.5 meeting SIRS criteria.  Possibly reactive to above. -Check urinalysis  Metastatic lung cancer with mets to the brain  Patient receiving ongoing care with oncology with Dr. [name] and Dr. Barbaraann Cao.  Recent MRI of the brain had showed progression of supratentorial lesions.  CT angiogram of the chest had noted concern for possible lesion of the sternum versus secondary to previous history of CPR. -Continue Decadron -Dr. [name] and Dr. Barbaraann Cao of oncology notified of patient's admission into the hospital. -Palliative care consulted for need of pain control  History of DVT Patient had prior history of DVT of the left leg back in 2021 for which patient was on Eliquis, but records note this was discontinued due to suspected radiation necrosis.  As of last Doppler ultrasound of lower extremity performed on 06/05/2023 patient had a left posterior tibial vein thrombus still present. -Follow-up Doppler ultrasound of the lower extremities.  Heart failure with reduced ejection fraction Chronic.  Patient not appear grossly fluid overloaded at this time.  BMP 98.3 F last echocardiogram noted EF to be 45 to 50% with grade 1 diastolic dysfunction.  -Strict I&Os and daily weights  Chronic pain History of thoracic compression fracture Family notes patient having history of chronic back pain related to thoracic compression fracture of the thoracic spine for which she is on oxycodone. -Continue current pain regimen -Dilaudid IV as needed for severe pain.  Essential hypertension Blood pressures were noted to be maintained 111/84 to 126/86. -Held spironolactone due to concern for renal insufficiency -Resume metoprolol and medically appropriate  Diabetes mellitus type 2, without long-term use of insulin Glucose 132. Last available hemoglobin A1c was 7.3 when checked on 9/23.  Thought to be secondary to steroids.  She is not on any medications for treatment. -Continue to monitor glucose  levels and start  sliding scale of insulin if warranted  Renal insufficiency Creatinine noted to be 1.51 with BUN 39.  Baseline creatinine previously noted to be 1.1-1.3. -Hold possible nephrotoxic agents -Recheck kidney function in a.m.  DVT prophylaxis: Heparin Advance Care Planning:   Code Status: Full Code   Consults: On-call  Family Communication: Family updated at bedside  Severity of Illness: The appropriate patient status for this patient is INPATIENT. Inpatient status is judged to be reasonable and necessary in order to provide the required intensity of service to ensure the patient's safety. The patient's presenting symptoms, physical exam findings, and initial radiographic and laboratory data in the context of their chronic comorbidities is felt to place them at high risk for further clinical deterioration. Furthermore, it is not anticipated that the patient will be medically stable for discharge from the hospital within 2 midnights of admission.   * I certify that at the point of admission it is my clinical judgment that the patient will require inpatient hospital care spanning beyond 2 midnights from the point of admission due to high intensity of service, high risk for further deterioration and high frequency of surveillance required.*  Author: Clydie Braun, MD 08/26/2023 7:14 AM  For on call review [email or web address].

## 2023-08-26 NOTE — Progress Notes (Signed)
Carryover admission to the Day Admitter.  I discussed this case with the EDP, Dr. Madilyn Hook.  Per these discussions:   This is a 67 year old female with history of small cell lung carcinoma with mets, V-fib arrest in September 2024, paroxysmal atrial fibrillation, as well as a DVT that is not undergoing anticoagulation due to history of intracranial bleed.  She is being admitted this evening with acute segmental pulmonary embolism after presenting with new onset pleuritic chest pain starting on 08/25/2023.  CTA chest shows an acute segmental pulmonary embolism, without CT evidence of right heart strain.  EKG shows no evidence of acute ischemic changes.  Troponin x 2 were both found to be 14.  EDP d/w pharmacist, and pt has subsequently been started on low dose heparin drip w/o preceding bolus, pending additional decision-making regarding treatment options.   I have placed an order for inpatient admission to PCU for further evaluation management of the above.  I have placed some additional preliminary admit orders via the adult multi-morbid admission order set. I have also ordered prn iv Dilaudid for her pleuritic chest discomfort.    Newton Pigg, DO Hospitalist

## 2023-08-26 NOTE — Progress Notes (Signed)
BLE venous duplex has been completed.  Results can be found under chart review under CV PROC. 08/26/2023 12:01 PM Sedalia Greeson RVT, RDMS

## 2023-08-26 NOTE — Progress Notes (Signed)
PHARMACY - ANTICOAGULATION CONSULT NOTE  Pharmacy Consult for IV heparin Indication: pulmonary embolus  Allergies  Allergen Reactions   Scopolamine Rash    Patient Measurements: Height: 5\' 2"  (157.5 cm) Weight: 70.7 kg (155 lb 13.8 oz) IBW/kg (Calculated) : 50.1 Heparin Dosing Weight: 65 kg  Vital Signs: Temp: 97.6 F (36.4 C) (12/16 1636) Temp Source: Axillary (12/16 1636) BP: 122/95 (12/16 1800) Pulse Rate: 102 (12/16 1800)  Labs: Recent Labs    08/26/23 0053 08/26/23 0331 08/26/23 0506 08/26/23 1100 08/26/23 1800  HGB 11.5*  --  10.7*  --   --   HCT 34.1*  --  32.5*  --   --   PLT 261  --  241  --   --   HEPARINUNFRC  --   --   --  0.16* 0.44  CREATININE 1.51*  --   --   --   --   TROPONINIHS 14 14  --   --   --     Estimated Creatinine Clearance: 33.7 mL/min (A) (by C-G formula based on SCr of 1.51 mg/dL (H)).   Medical History: Past Medical History:  Diagnosis Date   Anemia    Anxiety    Concussion 09/28/2019   DVT (deep venous thrombosis) (HCC) 2021   L leg   Dyspnea    GERD (gastroesophageal reflux disease)    Hypercholesterolemia    per pt, she does not have elevated lipids   Hypertension    met lung ca dx'd 09/2019   mets to spine, hip and brain   PONV (postoperative nausea and vomiting)    Tobacco abuse     Assessment: Virginia [name] is a 67 y.o. year old female presents on 08/26/2023 with shortness of breath and chest pain now with concern for new PE. PMH significant for metastatic non-small cell lung cancer with evidence of progression of brain mets per last MRI. CT on 12/16 with small segmental PE of the right lower lobe with right heart strain. No anticoagulation prior to admission. Previously on eliquis in 2021 for DVT which was discontinued due to cerebral hemorrhage. Pt is very high risk for anticoagulation which was discussed with family and patient at bedside. Will utilize a very conservative dosing strategies with lower heparin goal  and no bolus per discussion with EDP.   Heparin level therapeutic at 0.44 on 1050 units/hr. Hgb 10.7, plt wnl. No signs of bleeding noted.   Goal of Therapy:  Heparin level 0.3-0.5 units/ml Monitor platelets by anticoagulation protocol: Yes   Plan:  Continue heparin IV gtt at 1050 units/hr  6 hour confirmatory anti-Xa level  Daily anti-Xa level and CBC while on heparin  Monitor for s/sx of bleeding.  Thank you for involving pharmacy in the patient's care.   Theotis Burrow, PharmD PGY1 Acute Care Pharmacy Resident  08/26/2023 6:41 PM

## 2023-08-26 NOTE — Progress Notes (Addendum)
1900 patient came from ED alert x4 on 5l [place] able to make needs known on heparin gtt, chg completed

## 2023-08-26 NOTE — ED Notes (Addendum)
Contacted Timothy Opyd MD regarding the patient's oxygen saturation ranging from 81% and 100%. MD responded with titrate O2 as needed to maintain 92% oxygen saturation.

## 2023-08-26 NOTE — Plan of Care (Signed)
  Problem: Education: Goal: Knowledge of General Education information will improve Description: Including pain rating scale, medication(s)/side effects and non-pharmacologic comfort measures Outcome: Progressing   Problem: Health Behavior/Discharge Planning: Goal: Ability to manage health-related needs will improve Outcome: Progressing   Problem: Clinical Measurements: Goal: Ability to maintain clinical measurements within normal limits will improve Outcome: Progressing Goal: Will remain free from infection Outcome: Progressing Goal: Diagnostic test results will improve Outcome: Progressing Goal: Respiratory complications will improve Outcome: Progressing Goal: Cardiovascular complication will be avoided Outcome: Progressing   Problem: Activity: Goal: Risk for activity intolerance will decrease Outcome: Not Progressing Note: Base line wc and walker   Problem: Nutrition: Goal: Adequate nutrition will be maintained Outcome: Progressing   Problem: Coping: Goal: Level of anxiety will decrease Outcome: Progressing   Problem: Elimination: Goal: Will not experience complications related to bowel motility Outcome: Progressing Goal: Will not experience complications related to urinary retention Outcome: Progressing   Problem: Pain Management: Goal: General experience of comfort will improve Outcome: Not Progressing Note: Chest pain with deep breathing   Problem: Safety: Goal: Ability to remain free from injury will improve Outcome: Not Progressing   Problem: Skin Integrity: Goal: Risk for impaired skin integrity will decrease Outcome: Not Progressing

## 2023-08-26 NOTE — ED Provider Notes (Signed)
[name] EMERGENCY DEPARTMENT AT [facility] Provider Note   CSN: [id number] Arrival date & time: 08/26/23  0036     History  Chief Complaint  Patient presents with   Chest Pain    Virginia [name] is a 67 y.o. female.  The history is provided by the patient.  Chest Pain Virginia [name] is a 67 y.o. female who presents to the Emergency Department complaining of chest pain.  She presents to the ED by EMS for evaluation of chest pain that radiates to shoulders and back that started two hours prior to arrival.  Pain started at rest.  No fever.  Has sob. No cough.  Has pain on breathing.  No abdominal pain. No N/V/D. Has BLE edema for the last several days.  No prior similar sxs.    Has a hx/o metastatic non small cell lung cancer currently on chemo.   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     Home Medications Prior to Admission medications   Medication Sig Start Date End Date Taking? Authorizing Provider  acetaminophen (TYLENOL) 325 MG tablet Take 1-2 tablets (325-650 mg total) by mouth every 4 (four) hours as needed for mild pain (pain score 1-3). 07/01/23   Setzer, Lynnell Jude, [name]  albuterol (VENTOLIN HFA) 108 (90 Base) MCG/ACT inhaler INHALE 2 PUFFS INTO THE LUNGS EVERY 6 HOURS AS NEEDED FOR WHEEZING OR SHORTNESS OF BREATH 11/23/22   [name], MD  ALPRAZolam Prudy Feeler) 0.25 MG tablet Take 1 tablet (0.25 mg total) by mouth 2 (two) times daily as needed for anxiety. 07/01/23   Setzer, Lynnell Jude, [name]  bisacodyl (DULCOLAX) 5 MG EC tablet Take 1 tablet (5 mg total) by mouth daily as needed for moderate constipation. 07/01/23   Setzer, Lynnell Jude, [name]  Cyanocobalamin (VITAMIN B12 PO) Take 1 tablet by mouth daily. Gummies    [provider]  dexamethasone (DECADRON) 1 MG tablet Take 1 tablet (1 mg total) by mouth daily. 07/29/23   Henreitta Leber, MD  feeding supplement (ENSURE ENLIVE /  ENSURE PLUS) LIQD Take 237 mLs by mouth 2 (two) times daily between meals. 07/01/23   Setzer, Lynnell Jude, [name]  hydrocortisone (ANUSOL-HC) 2.5 % rectal cream use in rectal area 2 to 3 times a day as needed Patient taking differently: Place 1 Application rectally daily as needed for hemorrhoids. 05/24/23   [name], MD  levETIRAcetam (KEPPRA) 1000 MG tablet Take 1 tablet (1,000 mg total) by mouth 2 (two) times daily. 07/29/23   Henreitta Leber, MD  LYSINE PO Take 2 tablets by mouth daily.    [provider]  magnesium oxide (MAG-OX) 400 (240 Mg) MG tablet Take 1 tablet (400 mg total) by mouth 2 (two) times daily. 07/01/23   Setzer, Lynnell Jude, [name]  meclizine (ANTIVERT) 50 MG tablet Take 1 tablet (50 mg total) by mouth 3 (three) times daily. 07/29/23   Henreitta Leber, MD  metoprolol succinate (TOPROL-XL) 25 MG 24 hr tablet Take 1 tablet (25 mg total) by mouth daily. 07/26/23   Jodelle Gross, NP  Multiple Vitamin (MULTIVITAMIN WITH MINERALS) TABS tablet Take 1 tablet by mouth daily.    [provider]  Nutritional Supplements (,FEEDING SUPPLEMENT, PROSOURCE PLUS) liquid Take 30 mLs by mouth 2 (two) times daily between meals. 07/01/23   Setzer, Lynnell Jude, [name]  OLANZapine (ZYPREXA) 2.5 MG tablet Take 1 tablet (  2.5 mg total) by mouth at bedtime. 07/29/23   Ulysees Barns IV, MD  ondansetron (ZOFRAN) 4 MG tablet TAKE 1 TABLET(4 MG) BY MOUTH EVERY 8 HOURS AS NEEDED FOR NAUSEA OR VOMITING 01/30/23   Jaci Standard, MD  OVER THE COUNTER MEDICATION Take 2 tablets by mouth daily. Beet root    [provider]  oxyCODONE (OXY IR/ROXICODONE) 5 MG immediate release tablet Take 1 tablet (5 mg total) by mouth every 4 (four) hours as needed for severe pain (pain score 7-10). 08/12/23   Jaci Standard, MD  pantoprazole (PROTONIX) 40 MG tablet Take 1 tablet (40 mg total) by mouth daily. 09/05/22   [name], MD  polyethylene glycol (MIRALAX / GLYCOLAX) 17 g packet Take 17 g  by mouth every evening.    [provider]  potassium chloride SA (KLOR-CON M) 20 MEQ tablet Take 1 tablet (20 mEq total) by mouth 2 (two) times daily. 07/29/23   Jaci Standard, MD  senna-docusate (SENOKOT-S) 8.6-50 MG tablet Take 2 tablets by mouth daily as needed (Patient preference, Sennakot S for mild constipation). 07/01/23   Setzer, Lynnell Jude, [name]  spironolactone (ALDACTONE) 25 MG tablet Take 1 tablet (25 mg total) by mouth daily. 07/26/23   Jodelle Gross, NP  Tetrahydrozoline HCl (VISINE OP) Place 1 drop into both eyes daily as needed (irritation).    [provider]  VITAMIN D PO Take 1 capsule by mouth every evening.    [provider]  XTAMPZA ER 13.5 MG C12A Take 1 capsule by mouth in the morning and at bedtime. (every 12 hours) 08/14/23 02/10/24  Jaci Standard, MD      Allergies    Scopolamine    Review of Systems   Review of Systems  Cardiovascular:  Positive for chest pain.  All other systems reviewed and are negative.   Physical Exam Updated Vital Signs BP 126/82   Pulse 94   Temp (!) 100.7 F (38.2 C) (Oral)   Resp (!) 24   Ht 5\' 2"  (1.575 m)   Wt 70.7 kg   SpO2 99%   BMI 28.51 kg/m  Physical Exam Vitals and nursing note reviewed.  Constitutional:      Appearance: She is well-developed. She is ill-appearing.  HENT:     Head: Normocephalic and atraumatic.  Cardiovascular:     Rate and Rhythm: Normal rate and regular rhythm.     Heart sounds: No murmur heard. Pulmonary:     Effort: Pulmonary effort is normal. No respiratory distress.     Comments: Decreased air movement in bilateral bases with fine crackles in bilateral bases Abdominal:     Palpations: Abdomen is soft.     Tenderness: There is no abdominal tenderness. There is no guarding or rebound.  Musculoskeletal:        General: No tenderness.     Comments: Pitting edema to BLE, left greater than right  Skin:    General: Skin is warm and dry.     Capillary  Refill: Capillary refill takes 2 to 3 seconds.  Neurological:     Mental Status: She is alert and oriented to person, place, and time.  Psychiatric:        Behavior: Behavior normal.     ED Results / Procedures / Treatments   Labs (all labs ordered are listed, but only abnormal results are displayed) Labs Reviewed  COMPREHENSIVE METABOLIC PANEL - Abnormal; Notable for the following  components:      Result Value   Glucose, Bld 132 (*)    BUN 39 (*)    Creatinine, Ser 1.51 (*)    Albumin 2.6 (*)    GFR, Estimated 38 (*)    All other components within normal limits  CBC WITH DIFFERENTIAL/PLATELET - Abnormal; Notable for the following components:   WBC 12.7 (*)    RBC 3.47 (*)    Hemoglobin 11.5 (*)    HCT 34.1 (*)    Monocytes Absolute 2.2 (*)    Abs Immature Granulocytes 0.42 (*)    All other components within normal limits  CBC - Abnormal; Notable for the following components:   WBC 14.5 (*)    RBC 3.30 (*)    Hemoglobin 10.7 (*)    HCT 32.5 (*)    All other components within normal limits  BRAIN NATRIURETIC PEPTIDE  HEPARIN LEVEL (UNFRACTIONATED)  TROPONIN I (HIGH SENSITIVITY)  TROPONIN I (HIGH SENSITIVITY)    EKG None  Radiology CT Angio Chest PE W/Cm &/Or Wo Cm Result Date: 08/26/2023 CLINICAL DATA:  Pulmonary embolism, chest pain EXAM: CT ANGIOGRAPHY CHEST WITH CONTRAST TECHNIQUE: Multidetector CT imaging of the chest was performed using the standard protocol during bolus administration of intravenous contrast. Multiplanar CT image reconstructions and MIPs were obtained to evaluate the vascular anatomy. RADIATION DOSE REDUCTION: This exam was performed according to the departmental dose-optimization program which includes automated exposure control, adjustment of the mA and/or kV according to patient size and/or use of iterative reconstruction technique. CONTRAST:  50mL OMNIPAQUE IOHEXOL 350 MG/ML SOLN COMPARISON:  08/23/2023 FINDINGS: Cardiovascular: There is  adequate opacification of the pulmonary arterial tree. Several small intraluminal filling defects are identified within the basal segmental pulmonary arteries of the right lower lobe in keeping with acute pulmonary emboli. The embolic burden is small. The central pulmonary arteries are of normal caliber. There is mild global cardiomegaly with left ventricular enlargement. No CT evidence of right heart strain. Mild coronary artery calcification. Small pericardial effusion has increased in size since prior examination. Moderate atherosclerotic calcification within the thoracic aorta. No aortic aneurysm. Mediastinum/Nodes: No enlarged mediastinal, hilar, or axillary lymph nodes. Thyroid gland, trachea, and esophagus demonstrate no significant findings. Lungs/Pleura: Bibasilar atelectasis with lateral segmental collapse of the right lower lobe. Stable post treatment changes within the medial a right lower lobe abutting the pleural surface and extending into the azygoesophageal recess. A trace right pleural effusion. No confluent pulmonary infiltrate. No pneumothorax. No central obstructing lesion. Upper Abdomen: No acute abnormality. Musculoskeletal: T8 vertebroplasty. No acute bone abnormality. Mixed lytic and sclerotic lesion within the mid body of the sternum suspicious for a solitary osseous metastasis. This appears new since more remote prior examination of 06/03/2023 Review of the MIP images confirms the above findings. IMPRESSION: 1. Acute pulmonary emboli within the basal segmental pulmonary arteries of the right lower lobe. The embolic burden is small. No CT evidence of right heart strain. 2. Trace right pleural effusion, possibly reactive in nature. 3. Mild global cardiomegaly with left ventricular enlargement. Mild coronary artery calcification. 4. Small pericardial effusion, increased in size since prior examination. 5. Stable post treatment changes within the medial right lower lobe. 6. Mixed lytic and  sclerotic lesion within the mid body of the sternum suspicious for a solitary osseous metastasis. This appears new since more remote prior examination of 06/03/2023. PET CT examination may be helpful for further evaluation. Aortic Atherosclerosis (ICD10-I70.0). Electronically Signed   By: Lyda Kalata.D.  On: 08/26/2023 03:55   DG Chest Port 1 View Result Date: 08/26/2023 CLINICAL DATA:  Chest pain EXAM: PORTABLE CHEST 1 VIEW COMPARISON:  Radiographs 06/28/2023 and CT 08/23/2023 FINDINGS: Stable cardiomegaly. Aortic atherosclerotic calcification. Bibasilar atelectasis/scarring. Otherwise no focal consolidation. No pleural effusion or pneumothorax. Vertebroplasty of a midthoracic vertebral body. No displaced rib fractures. IMPRESSION: No active disease. Cardiomegaly. Electronically Signed   By: Minerva Fester M.D.   On: 08/26/2023 01:26    Procedures Procedures   CRITICAL CARE Performed by: Tilden Fossa   Total critical care time: 35 minutes  Critical care time was exclusive of separately billable procedures and treating other patients.  Critical care was necessary to treat or prevent imminent or life-threatening deterioration.  Critical care was time spent personally by me on the following activities: development of treatment plan with patient and/or surrogate as well as nursing, discussions with consultants, evaluation of patient's response to treatment, examination of patient, obtaining history from patient or surrogate, ordering and performing treatments and interventions, ordering and review of laboratory studies, ordering and review of radiographic studies, pulse oximetry and re-evaluation of patient's condition.  Medications Ordered in ED Medications  iohexol (OMNIPAQUE) 350 MG/ML injection 60 mL (has no administration in time range)  heparin ADULT infusion 100 units/mL (25000 units/261mL) (900 Units/hr Intravenous New Bag/Given 08/26/23 0503)  acetaminophen (TYLENOL) tablet  650 mg (has no administration in time range)    Or  acetaminophen (TYLENOL) suppository 650 mg (has no administration in time range)  naloxone [facility]) injection 0.4 mg (has no administration in time range)  HYDROmorphone (DILAUDID) injection 0.5 mg (has no administration in time range)  fentaNYL (SUBLIMAZE) injection 50 mcg (50 mcg Intravenous Given 08/26/23 0215)  ondansetron (ZOFRAN) injection 4 mg (4 mg Intravenous Given 08/26/23 0238)  iohexol (OMNIPAQUE) 350 MG/ML injection 50 mL (50 mLs Intravenous Contrast Given 08/26/23 0317)  fentaNYL (SUBLIMAZE) injection 50 mcg (50 mcg Intravenous Given 08/26/23 0534)    ED Course/ Medical Decision Making/ A&P                                 Medical Decision Making Amount and/or Complexity of Data Reviewed Labs: ordered. Radiology: ordered.  Risk Prescription drug management. Decision regarding hospitalization.   Patient with history of metastatic cancer on chemotherapy, DVT not on anticoagulation, V-fib arrest here for evaluation of chest pain, difficulty breathing.  EKG is nonischemic.  Troponins are negative x 2.  Given her history a CTA PE study was obtained, which demonstrates a small PE without heart strain as well as enlarging pericardial effusion and sternal met.  Discussed with patient findings of studies.  Given this PE that may be contributing to her symptoms will start low-dose heparin until risks and benefits can be fully delineated.  Patient is in agreement with this.  Medicine consulted for admission for ongoing workup and treatment.        Final Clinical Impression(s) / ED Diagnoses Final diagnoses:  Precordial pain  Single subsegmental pulmonary embolism without acute cor pulmonale (HCC)  Pericardial effusion    Rx / DC Orders ED Discharge Orders     None         Tilden Fossa, MD 08/26/23 [phone]

## 2023-08-26 NOTE — Consult Note (Signed)
[facility] Neuro-Oncology Consult Note  Patient Care Team: Baxley, Luanna Cole, MD as PCP - General (Internal Medicine) Jaci Standard, MD as PCP - Hematology/Oncology (Hematology and Oncology) Syliva Overman, RN as Oncology Nurse Navigator Edsel Petrin, DO as Consulting Physician (Hospice and Palliative Medicine) Lbcardiology, Rounding, MD as Rounding Team (Cardiology)  CHIEF COMPLAINTS/PURPOSE OF CONSULTATION:  Brain Metastases Radiation Necrosis  HISTORY OF PRESENTING ILLNESS:  Virginia [name] 67 y.o. female presented with shortness of breath, pleuritic pain and was found to have a pulmonary embolism.  Currently on heparin drip.  Prior to this she was at baseline functional status, mostly in wheelchair.  Alert and sharp cognitively.  Maintains decadron dose of 1mg  daily.  MEDICAL HISTORY:  Past Medical History:  Diagnosis Date   Anemia    Anxiety    Concussion 09/28/2019   DVT (deep venous thrombosis) (HCC) 2021   L leg   Dyspnea    GERD (gastroesophageal reflux disease)    Hypercholesterolemia    per pt, she does not have elevated lipids   Hypertension    met lung ca dx'd 09/2019   mets to spine, hip and brain   PONV (postoperative nausea and vomiting)    Tobacco abuse     SURGICAL HISTORY: Past Surgical History:  Procedure Laterality Date   ABDOMINAL HYSTERECTOMY     partial/ left ovaries   APPLICATION OF CRANIAL NAVIGATION N/A 12/02/2020   Procedure: APPLICATION OF CRANIAL NAVIGATION;  Surgeon: Maeola Harman, MD;  Location: [facility] OR;  Service: Neurosurgery;  Laterality: N/A;   CHOLECYSTECTOMY     CRANIOTOMY N/A 12/02/2020   Procedure: Posterior fossa craniotomy for tumor resection with brainlab;  Surgeon: Maeola Harman, MD;  Location: [facility] OR;  Service: Neurosurgery;  Laterality: N/A;   DILATION AND CURETTAGE OF UTERUS     IR IMAGING GUIDED PORT INSERTION  10/23/2019   IR PATIENT EVAL TECH 0-60 MINS  11/21/2021   IR PATIENT EVAL TECH 0-60 MINS   11/24/2021   IR PATIENT EVAL TECH 0-60 MINS  11/29/2021   IR PATIENT EVAL TECH 0-60 MINS  12/04/2021   IR PATIENT EVAL TECH 0-60 MINS  12/12/2021   IR PATIENT EVAL TECH 0-60 MINS  12/19/2021   IR PATIENT EVAL TECH 0-60 MINS  12/27/2021   IR PATIENT EVAL TECH 0-60 MINS  01/03/2022   IR PATIENT EVAL TECH 0-60 MINS  01/10/2022   IR PATIENT EVAL TECH 0-60 MINS  01/18/2022   IR PATIENT EVAL TECH 0-60 MINS  02/13/2022   IR PATIENT EVAL TECH 0-60 MINS  02/20/2022   IR RADIOLOGIST EVAL & MGMT  01/23/2022   IR REMOVAL TUN ACCESS W/ PORT W/O FL MOD SED  11/17/2021   KYPHOPLASTY N/A 03/15/2020   Procedure: Thoracic Eight KYPHOPLASTY;  Surgeon: Maeola Harman, MD;  Location: [facility] OR;  Service: Neurosurgery;  Laterality: N/A;  prone    LIPOMA EXCISION  2018   removed under left breast and right thigh.   TUBAL LIGATION      SOCIAL HISTORY: Social History   Socioeconomic History   Marital status: Married    Spouse name: Not on file   Number of children: 3   Years of education: Not on file   Highest education level: Not on file  Occupational History   Occupation: owns Editor, commissioning co    Employer: [employer]  Tobacco Use   Smoking status: Former    Current packs/day: 0.00    Types: Cigarettes  Quit date: 10/31/2012    Years since quitting: 10.8   Smokeless tobacco: Never  Vaping Use   Vaping status: Never Used  Substance and Sexual Activity   Alcohol use: Yes    Alcohol/week: 0.0 standard drinks of alcohol    Comment: rare   Drug use: No   Sexual activity: Yes  Other Topics Concern   Not on file  Social History Narrative   Not on file   Social Drivers of Health   Financial Resource Strain: Not on file  Food Insecurity: No Food Insecurity (08/26/2023)   Hunger Vital Sign    Worried About Running Out of Food in the Last Year: Never true    Ran Out of Food in the Last Year: Never true  Transportation Needs: No Transportation Needs (08/26/2023)   PRAPARE - Scientist, research (physical sciences) (Medical): No    Lack of Transportation (Non-Medical): No  Physical Activity: Not on file  Stress: Not on file  Social Connections: Not on file  Intimate Partner Violence: Not At Risk (08/26/2023)   Humiliation, Afraid, Rape, and Kick questionnaire    Fear of Current or Ex-Partner: No    Emotionally Abused: No    Physically Abused: No    Sexually Abused: No    FAMILY HISTORY: Family History  Problem Relation Age of Onset   Heart disease Father    Drug abuse Daughter    Drug abuse Son    Cancer Sister    Heart disease Brother    Heart attack Brother     ALLERGIES:  is allergic to scopolamine.  MEDICATIONS:  Current Facility-Administered Medications  Medication Dose Route Frequency Provider Last Rate Last Admin   acetaminophen (TYLENOL) tablet 650 mg  650 mg Oral Q6H PRN [name], Justin B, DO       Or   acetaminophen (TYLENOL) suppository 650 mg  650 mg Rectal Q6H PRN [name], Justin B, DO       ALPRAZolam Prudy Feeler) tablet 0.25 mg  0.25 mg Oral BID PRN Madelyn Flavors A, MD       heparin ADULT infusion 100 units/mL (25000 units/254mL)  1,050 Units/hr Intravenous Continuous Daylene Posey, RPH 10.5 mL/hr at 08/26/23 1207 1,050 Units/hr at 08/26/23 1207   HYDROmorphone (DILAUDID) injection 0.5 mg  0.5 mg Intravenous Q2H PRN [name], Justin B, DO   0.5 mg at 08/26/23 1115   iohexol (OMNIPAQUE) 350 MG/ML injection 60 mL  60 mL Intravenous Once PRN Tilden Fossa, MD       naloxone [facility]) injection 0.4 mg  0.4 mg Intravenous PRN [name], Justin B, DO       ondansetron (ZOFRAN) tablet 4 mg  4 mg Oral Q6H PRN Madelyn Flavors A, MD       Or   ondansetron (ZOFRAN) injection 4 mg  4 mg Intravenous Q6H PRN Madelyn Flavors A, MD   4 mg at 08/26/23 1115   oxyCODONE (Oxy IR/ROXICODONE) immediate release tablet 5 mg  5 mg Oral Q4H PRN Madelyn Flavors A, MD       oxyCODONE (OXYCONTIN) 12 hr tablet 15 mg  15 mg Oral Q12H Smith, Rondell A, MD   15 mg at 08/26/23 0956   sodium  chloride flush (NS) 0.9 % injection 3 mL  3 mL Intravenous Q12H Clydie Braun, MD       Current Outpatient Medications  Medication Sig Dispense Refill   acetaminophen (TYLENOL) 325 MG tablet Take 1-2 tablets (325-650 mg total) by mouth every 4 (  four) hours as needed for mild pain (pain score 1-3).     albuterol (VENTOLIN HFA) 108 (90 Base) MCG/ACT inhaler INHALE 2 PUFFS INTO THE LUNGS EVERY 6 HOURS AS NEEDED FOR WHEEZING OR SHORTNESS OF BREATH (Patient taking differently: Inhale 2 puffs into the lungs every 6 (six) hours as needed for wheezing or shortness of breath.) 6.7 g 11   ALPRAZolam (XANAX) 0.25 MG tablet Take 1 tablet (0.25 mg total) by mouth 2 (two) times daily as needed for anxiety. 30 tablet 0   Cyanocobalamin (VITAMIN B12 PO) Take 1 tablet by mouth daily. Gummies     dexamethasone (DECADRON) 1 MG tablet Take 1 tablet (1 mg total) by mouth daily. 90 tablet 0   feeding supplement (ENSURE ENLIVE / ENSURE PLUS) LIQD Take 237 mLs by mouth 2 (two) times daily between meals.     hydrocortisone (ANUSOL-HC) 2.5 % rectal cream use in rectal area 2 to 3 times a day as needed (Patient taking differently: Place 1 Application rectally daily as needed for hemorrhoids.) 30 g PRN   levETIRAcetam (KEPPRA) 1000 MG tablet Take 1 tablet (1,000 mg total) by mouth 2 (two) times daily. 90 tablet 2   LYSINE PO Take 2 tablets by mouth daily.     magnesium oxide (MAG-OX) 400 (240 Mg) MG tablet Take 1 tablet (400 mg total) by mouth 2 (two) times daily. 60 tablet 0   meclizine (ANTIVERT) 50 MG tablet Take 1 tablet (50 mg total) by mouth 3 (three) times daily. 90 tablet 2   metoprolol succinate (TOPROL-XL) 25 MG 24 hr tablet Take 1 tablet (25 mg total) by mouth daily. 90 tablet 3   Multiple Vitamin (MULTIVITAMIN WITH MINERALS) TABS tablet Take 1 tablet by mouth daily.     Nutritional Supplements (,FEEDING SUPPLEMENT, PROSOURCE PLUS) liquid Take 30 mLs by mouth 2 (two) times daily between meals.     OLANZapine  (ZYPREXA) 2.5 MG tablet Take 1 tablet (2.5 mg total) by mouth at bedtime. 90 tablet 1   ondansetron (ZOFRAN) 4 MG tablet TAKE 1 TABLET(4 MG) BY MOUTH EVERY 8 HOURS AS NEEDED FOR NAUSEA OR VOMITING (Patient taking differently: Take 4 mg by mouth every 8 (eight) hours as needed for nausea or vomiting.) 40 tablet 2   OVER THE COUNTER MEDICATION Take 2 tablets by mouth daily. Beet root     oxyCODONE (OXY IR/ROXICODONE) 5 MG immediate release tablet Take 1 tablet (5 mg total) by mouth every 4 (four) hours as needed for severe pain (pain score 7-10). 90 tablet 0   pantoprazole (PROTONIX) 40 MG tablet Take 1 tablet (40 mg total) by mouth daily. 90 tablet 3   polyethylene glycol (MIRALAX / GLYCOLAX) 17 g packet Take 17 g by mouth every evening.     potassium chloride SA (KLOR-CON M) 20 MEQ tablet Take 1 tablet (20 mEq total) by mouth 2 (two) times daily. 90 tablet 1   senna-docusate (SENOKOT-S) 8.6-50 MG tablet Take 2 tablets by mouth daily as needed (Patient preference, Sennakot S for mild constipation). (Patient taking differently: Take 2 tablets by mouth daily as needed for mild constipation.)     spironolactone (ALDACTONE) 25 MG tablet Take 1 tablet (25 mg total) by mouth daily. 90 tablet 3   VITAMIN D PO Take 1 capsule by mouth every evening.     XTAMPZA ER 13.5 MG C12A Take 1 capsule by mouth in the morning and at bedtime. (every 12 hours) 60 capsule 0    REVIEW OF SYSTEMS:  Constitutional: Denies fevers, chills or abnormal weight loss Eyes: Denies blurriness of vision Ears, nose, mouth, throat, and face: Denies mucositis or sore throat Respiratory: +dyspnea Cardiovascular: +chest pain Gastrointestinal:  Denies nausea, constipation, diarrhea GU: Denies dysuria or incontinence Skin: Denies abnormal skin rashes Neurological: Per HPI Musculoskeletal: Denies joint pain, back or neck discomfort. No decrease in ROM Behavioral/Psych: Denies anxiety, disturbance in thought content, and mood  instability   PHYSICAL EXAMINATION: Vitals:   08/26/23 1545 08/26/23 1636  BP: 118/83   Pulse: 100   Resp: 11   Temp:  97.6 F (36.4 C)  SpO2: 100%    KPS: 60. General: In bed Head: Normal EENT: No conjunctival injection or scleral icterus.  Lungs: Resp effort normal Cardiac: Regular rate Abdomen: Non-distended abdomen Skin: No rashes cyanosis or petechiae. Extremities: No clubbing or edema   Neurologic Exam: Mental Status: Awake, alert, attentive to examiner. Oriented to self and environment. Language is fluent with intact comprehension.  Cranial Nerves: Visual acuity is grossly normal. Noted dysarthria. Visual fields are full. Extra-ocular movements intact. No ptosis. Face is symmetric Motor: Tone and bulk are normal. Power is full in both arms and legs. Reflexes are symmetric, no pathologic reflexes present.  Dense dysmetria, coordination impaired bilaterally. Sensory: Intact to light touch Gait: Non ambulatory    LABORATORY DATA:  I have reviewed the data as listed Lab Results  Component Value Date   WBC 14.5 (H) 08/26/2023   HGB 10.7 (L) 08/26/2023   HCT 32.5 (L) 08/26/2023   MCV 98.5 08/26/2023   PLT 241 08/26/2023   Recent Labs    07/19/23 1258 08/12/23 1110 08/26/23 0053  NA 141 140 138  K 4.6 4.0 3.9  CL 105 106 99  CO2 32 30 28  GLUCOSE 150* 109* 132*  BUN 55* 49* 39*  CREATININE 1.52* 1.32* 1.51*  CALCIUM 9.5 8.6* 9.2  GFRNONAA 38* 45* 38*  PROT 6.4* 6.0* 6.5  ALBUMIN 3.2* 3.1* 2.6*  AST 21 16 35  ALT 16 15 41  ALKPHOS 62 48 47  BILITOT 0.3 0.2 0.3    RADIOGRAPHIC STUDIES: I have personally reviewed the radiological images as listed and agreed with the findings in the report. VAS [name] LOWER EXTREMITY VENOUS (DVT) Result Date: 08/26/2023  Lower Venous DVT Study Patient Name:  SALSABIL SHARPE  Date of Exam:   08/26/2023 Medical Rec #: [id number]          Accession #:    [id number] Date of Birth: 09/18/1955         Patient Gender: F Patient  Age:   27 years Exam Location:  [facility] Procedure:      VAS [name] LOWER EXTREMITY VENOUS (DVT) Referring Phys: Madelyn Flavors --------------------------------------------------------------------------------  Indications: Pulmonary embolism.  Risk Factors: Chemotherapy/metastatic cancer, hx of DVT. Limitations: Poor ultrasound/tissue interface. Comparison Study: Previous exam on 06/05/23 was positive for age indeterminate                   DVT in left PTV Performing Technologist: Ernestene Mention RVT, RDMS  Examination Guidelines: A complete evaluation includes B-mode imaging, spectral Doppler, color Doppler, and power Doppler as needed of all accessible portions of each vessel. Bilateral testing is considered an integral part of a complete examination. Limited examinations for reoccurring indications may be performed as noted. The reflux portion of the exam is performed with the patient in reverse Trendelenburg.  +---------+---------------+---------+-----------+----------+--------------+ RIGHT    CompressibilityPhasicitySpontaneityPropertiesThrombus Aging +---------+---------------+---------+-----------+----------+--------------+ CFV  Full           Yes      Yes                                 +---------+---------------+---------+-----------+----------+--------------+ SFJ      Full                                                        +---------+---------------+---------+-----------+----------+--------------+ FV Prox  Full           Yes      Yes                                 +---------+---------------+---------+-----------+----------+--------------+ FV Mid   Full           Yes      Yes                                 +---------+---------------+---------+-----------+----------+--------------+ FV DistalFull           Yes      Yes                                 +---------+---------------+---------+-----------+----------+--------------+ PFV      Full                                                         +---------+---------------+---------+-----------+----------+--------------+ POP      Full           Yes      Yes                                 +---------+---------------+---------+-----------+----------+--------------+ PTV      Full                                                        +---------+---------------+---------+-----------+----------+--------------+ PERO     Full                                                        +---------+---------------+---------+-----------+----------+--------------+   +---------+---------------+---------+-----------+----------+-------------------+ LEFT     CompressibilityPhasicitySpontaneityPropertiesThrombus Aging      +---------+---------------+---------+-----------+----------+-------------------+ CFV      Full           Yes      Yes                                      +---------+---------------+---------+-----------+----------+-------------------+ SFJ  Full                                                             +---------+---------------+---------+-----------+----------+-------------------+ FV Prox  Full           Yes      Yes                                      +---------+---------------+---------+-----------+----------+-------------------+ FV Mid   Full           Yes      Yes                                      +---------+---------------+---------+-----------+----------+-------------------+ FV DistalFull           Yes      Yes                                      +---------+---------------+---------+-----------+----------+-------------------+ PFV      Full                                                             +---------+---------------+---------+-----------+----------+-------------------+ POP      Full           Yes      Yes                                       +---------+---------------+---------+-----------+----------+-------------------+ PTV      Full                                                             +---------+---------------+---------+-----------+----------+-------------------+ PERO     Full                                         Not well visualized +---------+---------------+---------+-----------+----------+-------------------+     Summary: BILATERAL: - No evidence of deep vein thrombosis seen in the lower extremities, bilaterally. -No evidence of popliteal cyst, bilaterally.   *See table(s) above for measurements and observations.    Preliminary    CT Angio Chest PE W/Cm &/Or Wo Cm Result Date: 08/26/2023 CLINICAL DATA:  Pulmonary embolism, chest pain EXAM: CT ANGIOGRAPHY CHEST WITH CONTRAST TECHNIQUE: Multidetector CT imaging of the chest was performed using the standard protocol during bolus administration of intravenous contrast. Multiplanar CT image reconstructions and MIPs were obtained to evaluate the vascular anatomy. RADIATION DOSE REDUCTION: This exam was performed according to the departmental dose-optimization program which includes automated  exposure control, adjustment of the mA and/or kV according to patient size and/or use of iterative reconstruction technique. CONTRAST:  50mL OMNIPAQUE IOHEXOL 350 MG/ML SOLN COMPARISON:  08/23/2023 FINDINGS: Cardiovascular: There is adequate opacification of the pulmonary arterial tree. Several small intraluminal filling defects are identified within the basal segmental pulmonary arteries of the right lower lobe in keeping with acute pulmonary emboli. The embolic burden is small. The central pulmonary arteries are of normal caliber. There is mild global cardiomegaly with left ventricular enlargement. No CT evidence of right heart strain. Mild coronary artery calcification. Small pericardial effusion has increased in size since prior examination. Moderate atherosclerotic calcification  within the thoracic aorta. No aortic aneurysm. Mediastinum/Nodes: No enlarged mediastinal, hilar, or axillary lymph nodes. Thyroid gland, trachea, and esophagus demonstrate no significant findings. Lungs/Pleura: Bibasilar atelectasis with lateral segmental collapse of the right lower lobe. Stable post treatment changes within the medial a right lower lobe abutting the pleural surface and extending into the azygoesophageal recess. A trace right pleural effusion. No confluent pulmonary infiltrate. No pneumothorax. No central obstructing lesion. Upper Abdomen: No acute abnormality. Musculoskeletal: T8 vertebroplasty. No acute bone abnormality. Mixed lytic and sclerotic lesion within the mid body of the sternum suspicious for a solitary osseous metastasis. This appears new since more remote prior examination of 06/03/2023 Review of the MIP images confirms the above findings. IMPRESSION: 1. Acute pulmonary emboli within the basal segmental pulmonary arteries of the right lower lobe. The embolic burden is small. No CT evidence of right heart strain. 2. Trace right pleural effusion, possibly reactive in nature. 3. Mild global cardiomegaly with left ventricular enlargement. Mild coronary artery calcification. 4. Small pericardial effusion, increased in size since prior examination. 5. Stable post treatment changes within the medial right lower lobe. 6. Mixed lytic and sclerotic lesion within the mid body of the sternum suspicious for a solitary osseous metastasis. This appears new since more remote prior examination of 06/03/2023. PET CT examination may be helpful for further evaluation. Aortic Atherosclerosis (ICD10-I70.0). Electronically Signed   By: Helyn Numbers M.D.   On: 08/26/2023 03:55   DG Chest Port 1 View Result Date: 08/26/2023 CLINICAL DATA:  Chest pain EXAM: PORTABLE CHEST 1 VIEW COMPARISON:  Radiographs 06/28/2023 and CT 08/23/2023 FINDINGS: Stable cardiomegaly. Aortic atherosclerotic calcification.  Bibasilar atelectasis/scarring. Otherwise no focal consolidation. No pleural effusion or pneumothorax. Vertebroplasty of a midthoracic vertebral body. No displaced rib fractures. IMPRESSION: No active disease. Cardiomegaly. Electronically Signed   By: Minerva Fester M.D.   On: 08/26/2023 01:26   CT CHEST ABDOMEN PELVIS W CONTRAST Result Date: 08/24/2023 CLINICAL DATA:  Non-small cell lung cancer restaging * Tracking Code: BO * EXAM: CT CHEST, ABDOMEN, AND PELVIS WITH CONTRAST TECHNIQUE: Multidetector CT imaging of the chest, abdomen and pelvis was performed following the standard protocol during bolus administration of intravenous contrast. RADIATION DOSE REDUCTION: This exam was performed according to the departmental dose-optimization program which includes automated exposure control, adjustment of the mA and/or kV according to patient size and/or use of iterative reconstruction technique. CONTRAST:  OMNIPAQUE IOHEXOL 300 MG/ML  SOLN COMPARISON:  06/03/2023, 05/23/2023 FINDINGS: CT CHEST FINDINGS Cardiovascular: Aortic atherosclerosis. Cardiomegaly. Scattered left coronary artery calcifications. Small pericardial effusion, unchanged. Mediastinum/Nodes: Unchanged, matted post treatment appearance of soft tissue throughout the mediastinum (series 2, image 27). No discretely enlarged mediastinal, hilar, or axillary lymph nodes. Thyroid gland, trachea, and esophagus demonstrate no significant findings. Lungs/Pleura: Nearly complete resolution of previously seen extensive acute airspace disease and consolidation throughout of the  lungs. Bland appearing, bandlike scarring of the bilateral lung bases with scattered small nodular opacities in the apices, for example an irregular opacity in the posterior right apex measuring 1.0 cm (series 4, image 42). Slight interval increase in size of a spiculated nodule in the infrahilar right lower lobe measuring 1.1 x 0.7 cm, previously 0.9 x 0.4 cm (series 4, image 65).  Otherwise unchanged post treatment appearance of the perihilar and infrahilar right lung. Musculoskeletal: No chest wall abnormality. No acute osseous findings. CT ABDOMEN PELVIS FINDINGS Hepatobiliary: No focal liver abnormality is seen. Status post cholecystectomy. Unchanged mild postoperative biliary dilatation. Pancreas: Unremarkable. No pancreatic ductal dilatation or surrounding inflammatory changes. Spleen: Normal in size without significant abnormality. Adrenals/Urinary Tract: Adrenal glands are unremarkable. Kidneys are normal, without renal calculi, solid lesion, or hydronephrosis. Bladder is unremarkable. Stomach/Bowel: Stomach is within normal limits. Appendix appears normal. No evidence of bowel wall thickening, distention, or inflammatory changes. Sigmoid diverticulosis. Vascular/Lymphatic: Aortic atherosclerosis. No enlarged abdominal or pelvic lymph nodes. Reproductive: Status post hysterectomy. Other: No abdominal wall hernia or abnormality. No ascites. Musculoskeletal: No acute osseous findings. Subtle sclerosis of a nondisplaced fracture of the mid sternal body seen acutely on examination dated 06/03/2023 (series 6, image 112). Multiple subacute, partially callused anterior rib fractures. Unchanged sclerotic wedge deformity of T8 status post vertebral cement augmentation. Additional areas of ill-defined sclerosis unchanged, for example in the superior anterior aspect of L2 (series 6, image 110). IMPRESSION: 1. Nearly complete resolution of previously seen extensive acute airspace disease and consolidation throughout of the lungs. 2. Slight interval increase in size of a spiculated nodule in the infrahilar right lower lobe, concerning for local recurrence. This could be assessed for abnormal metabolic activity by PET-CT if desired, otherwise attention on follow-up. 3. Otherwise unchanged post treatment appearance of the perihilar and infrahilar right lung as well as matted soft tissue throughout  the mediastinum. No discretely enlarged lymph nodes. 4. Multiple small unchanged solid and ground-glass pulmonary nodules, largest an irregular nodule in the posterior right apex measuring 1.0 cm. These are nonspecific. Continued attention on follow-up. 5. Subtle sclerosis of a nondisplaced fracture of the mid sternal body as well as multiple partially callused anterior rib fractures, seen acutely on examination dated 06/03/2023 following CPR. 6. Unchanged sclerotic wedge deformity of T8 status post vertebral cement augmentation. Additional areas of ill-defined sclerosis unchanged, nonspecific and possibly reflecting treated osseous metastatic disease. 7. Cardiomegaly and coronary artery disease. Aortic Atherosclerosis (ICD10-I70.0). Electronically Signed   By: Jearld Lesch M.D.   On: 08/24/2023 14:27   MR BRAIN W WO CONTRAST Result Date: 08/19/2023 CLINICAL DATA:  Brain/CNS neoplasm, assess treatment response. Non-small cell lung cancer. EXAM: MRI HEAD WITHOUT AND WITH CONTRAST TECHNIQUE: Multiplanar, multiecho pulse sequences of the brain and surrounding structures were obtained without and with intravenous contrast. COMPARISON:  MRI of the brain May 21, 2023. FINDINGS: Brain: Multiple enhancing lesions in the bilateral cerebellar hemisphere, the largest in the left cerebellar hemisphere with necrotic center with roughly unchanged size and contrast enhancement when compared to prior MRI. However, T2 hyperintensity involving most of the cerebellar hemispheres the, vermis and middle cerebellar peduncles as well as right side of the medulla have progressed. 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 There is also area of contrast enhancement on the left side protruding through the area of craniotomy, unchanged. 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 Some of the lesions are associated with significant surrounding vasogenic edema, for example anterior left frontal lobe lesion, measuring approximately 11 x 7 mm compared to 9 x 6 mm on prior, 2 posterior left frontal lesions measuring approximately 5 mm and 4 mm compared to 4 mm and 3 mm on prior and right parietal lobe lesion measuring 16 x 11 mm compared to 12 x 11 mm on prior. Three punctate foci of restricted diffusion are seen in the bilateral cerebellar hemispheres (series 3, images 48 and 52). Scattered and confluent T2 hyperintensity within the elsewhere in the white matter of the cerebral hemispheres could be related to microangiopathy and/or posttreatment changes, progressed. Vascular: Normal flow voids. Skull and upper cervical spine: Postsurgical changes from left suboccipital craniotomy. Sinuses/Orbits: Small left maxillary sinus mucous retention cyst and opacification of a posterior left ethmoid cells are unchanged. Left mastoid effusion. The orbits are maintained. Other: None. IMPRESSION: 1. Evidence of disease progression with increased size of most of the previously seen enhancing lesions in the supratentorial compartment with mildly progressed vasogenic edema surrounding the largest lesions. 2. Stable size of the infratentorial lesions with progression of the associated T2 hyperintensity involving nearly the entire cerebellum, extending into the middle cerebellar peduncles and right side of the medulla. 3. 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 Electronically Signed   By: Baldemar Lenis M.D.   On: 08/19/2023 15:45    ASSESSMENT & PLAN:  Brain Metastases Radiation Necrosis Acute Pulmonary Embolism  Virginia [name] presents with acute  segmental PE; we feel that her risk of hemorrhagic conversion of brain disease (still suspected radiation necrosis) is lower than the risk of clot propagation or extension in absence of anticoagulation.  This PE is very much symptomatic for her.  Agreeable with heparin and ultimately oral anticoagulation once cleared.  Brain MRI is quite challenging; we feel the progression compared to prior study could be partly artifactual due to significant draw-down of corticosteroids.  At the time of the prior study in September, she had been weaning off of 8mg  daily decadron.  Regardless, she is not a candidate for intervention such as biopsy.  Also not candidate for re-irradiation without tissue confirmation of tumor > radiation injury.  We recommended repeating MRI brain in 6 weeks, while continuing on 1mg  daily decadron.  If next scan is stable, imaging artifact becomes more likely.  They understand and are agreeable with this plan.  Our team will arrange follow up following discharge.  All questions were answered. The patient knows to call the clinic with any problems, questions or concerns.  The total time spent in the encounter was 60 minutes and more than 50% was on counseling and review of test results     Henreitta Leber, MD 08/26/2023 4:48 PM

## 2023-08-26 NOTE — ED Triage Notes (Signed)
Pt came from home with chest pain experiencing it for 2 hours. Pt experiencing nausea vomiting with difficulty breathing. History of cardiac arrest in October 2024.

## 2023-08-26 NOTE — Progress Notes (Signed)
PHARMACY - ANTICOAGULATION CONSULT NOTE  Pharmacy Consult for IV heparin Indication: pulmonary embolus  Allergies  Allergen Reactions   Scopolamine Rash    Patient Measurements: Height: 5\' 2"  (157.5 cm) Weight: 70.7 kg (155 lb 13.8 oz) IBW/kg (Calculated) : 50.1 Heparin Dosing Weight: 65 kg  Vital Signs: Temp: 98 F (36.7 C) (12/16 1005) Temp Source: Oral (12/16 0507) BP: 113/84 (12/16 1005) Pulse Rate: 38 (12/16 1005)  Labs: Recent Labs    08/26/23 0053 08/26/23 0331 08/26/23 0506 08/26/23 1100  HGB 11.5*  --  10.7*  --   HCT 34.1*  --  32.5*  --   PLT 261  --  241  --   HEPARINUNFRC  --   --   --  0.16*  CREATININE 1.51*  --   --   --   TROPONINIHS 14 14  --   --     Estimated Creatinine Clearance: 33.7 mL/min (A) (by C-G formula based on SCr of 1.51 mg/dL (H)).   Medical History: Past Medical History:  Diagnosis Date   Anemia    Anxiety    Concussion 09/28/2019   DVT (deep venous thrombosis) (HCC) 2021   L leg   Dyspnea    GERD (gastroesophageal reflux disease)    Hypercholesterolemia    per pt, she does not have elevated lipids   Hypertension    met lung ca dx'd 09/2019   mets to spine, hip and brain   PONV (postoperative nausea and vomiting)    Tobacco abuse     Assessment: Virginia [name] is a 67 y.o. year old female presents on 08/26/2023 with shortness of breath and chest pain now with concern for new PE. PMH significant for metastatic non-small cell lung cancer with evidence of progression of brain mets per last MRI. CT on 12/16 with small segmental PE of the right lower lobe with right heart strain. No anticoagulation prior to admission. Previously on eliquis in 2021 for DVT which was discontinued due to cerebral hemorrhage. Pt is very high risk for anticoagulation which was discussed with family and patient at bedside. Will utilize a very conservative dosing strategies with lower heparin goal and no bolus per discussion with EDP.   Initial  heparin level subtherapeutic on 900 units/hr  Goal of Therapy:  Heparin level 0.3-0.5 units/ml Monitor platelets by anticoagulation protocol: Yes   Plan:  Increase heparin gtt to 1050 units/hr F/u 6 hour heparin level  Virginia [name], PharmD, [facility] Clinical Pharmacist ED Pharmacist Phone # [phone] 08/26/2023 12:03 PM

## 2023-08-27 DIAGNOSIS — I2699 Other pulmonary embolism without acute cor pulmonale: Secondary | ICD-10-CM | POA: Diagnosis not present

## 2023-08-27 DIAGNOSIS — Z7189 Other specified counseling: Secondary | ICD-10-CM

## 2023-08-27 DIAGNOSIS — Z515 Encounter for palliative care: Secondary | ICD-10-CM | POA: Diagnosis not present

## 2023-08-27 DIAGNOSIS — I3139 Other pericardial effusion (noninflammatory): Secondary | ICD-10-CM

## 2023-08-27 DIAGNOSIS — R072 Precordial pain: Secondary | ICD-10-CM | POA: Diagnosis not present

## 2023-08-27 LAB — HEPARIN LEVEL (UNFRACTIONATED): Heparin Unfractionated: 0.33 [IU]/mL (ref 0.30–0.70)

## 2023-08-27 LAB — CBC
HCT: 32.1 % — ABNORMAL LOW (ref 36.0–46.0)
Hemoglobin: 10.8 g/dL — ABNORMAL LOW (ref 12.0–15.0)
MCH: 33.1 pg (ref 26.0–34.0)
MCHC: 33.6 g/dL (ref 30.0–36.0)
MCV: 98.5 fL (ref 80.0–100.0)
Platelets: 261 10*3/uL (ref 150–400)
RBC: 3.26 MIL/uL — ABNORMAL LOW (ref 3.87–5.11)
RDW: 12 % (ref 11.5–15.5)
WBC: 16.9 10*3/uL — ABNORMAL HIGH (ref 4.0–10.5)
nRBC: 0 % (ref 0.0–0.2)

## 2023-08-27 MED ORDER — PANTOPRAZOLE SODIUM 40 MG PO TBEC
40.0000 mg | DELAYED_RELEASE_TABLET | Freq: Every day | ORAL | Status: DC
  Administered 2023-08-27 – 2023-08-29 (×3): 40 mg via ORAL
  Filled 2023-08-27 (×3): qty 1

## 2023-08-27 MED ORDER — METOPROLOL SUCCINATE ER 25 MG PO TB24
25.0000 mg | ORAL_TABLET | Freq: Every day | ORAL | Status: DC
  Administered 2023-08-27 – 2023-08-29 (×3): 25 mg via ORAL
  Filled 2023-08-27 (×3): qty 1

## 2023-08-27 MED ORDER — LEVETIRACETAM 500 MG PO TABS
1000.0000 mg | ORAL_TABLET | Freq: Two times a day (BID) | ORAL | Status: DC
  Administered 2023-08-27 – 2023-08-29 (×5): 1000 mg via ORAL
  Filled 2023-08-27 (×5): qty 2

## 2023-08-27 MED ORDER — OLANZAPINE 5 MG PO TABS
2.5000 mg | ORAL_TABLET | Freq: Every day | ORAL | Status: DC
  Administered 2023-08-27 – 2023-08-28 (×2): 2.5 mg via ORAL
  Filled 2023-08-27 (×2): qty 1

## 2023-08-27 MED ORDER — DEXAMETHASONE 0.5 MG PO TABS
1.0000 mg | ORAL_TABLET | Freq: Every day | ORAL | Status: DC
  Administered 2023-08-27 – 2023-08-28 (×2): 1 mg via ORAL
  Filled 2023-08-27 (×3): qty 2

## 2023-08-27 MED ORDER — PROSOURCE PLUS PO LIQD
30.0000 mL | Freq: Three times a day (TID) | ORAL | Status: DC
  Administered 2023-08-27 – 2023-08-29 (×7): 30 mL via ORAL
  Filled 2023-08-27 (×6): qty 30

## 2023-08-27 MED ORDER — HYDROCORTISONE (PERIANAL) 2.5 % EX CREA: 1.0000 | TOPICAL_CREAM | Freq: Every day | CUTANEOUS | Status: DC | PRN

## 2023-08-27 MED ORDER — VITAMIN B-12 1000 MCG PO TABS
500.0000 ug | ORAL_TABLET | Freq: Every day | ORAL | Status: DC
  Administered 2023-08-27 – 2023-08-29 (×3): 500 ug via ORAL
  Filled 2023-08-27 (×3): qty 1

## 2023-08-27 MED ORDER — POLYETHYLENE GLYCOL 3350 17 G PO PACK: 17.0000 g | PACK | Freq: Two times a day (BID) | ORAL | Status: DC | PRN

## 2023-08-27 MED ORDER — ENSURE ENLIVE PO LIQD
237.0000 mL | Freq: Two times a day (BID) | ORAL | Status: DC
  Administered 2023-08-28 – 2023-08-29 (×3): 237 mL via ORAL

## 2023-08-27 MED ORDER — BISACODYL 5 MG PO TBEC
10.0000 mg | DELAYED_RELEASE_TABLET | Freq: Every day | ORAL | Status: DC | PRN
  Administered 2023-08-28: 10 mg via ORAL
  Filled 2023-08-27: qty 2

## 2023-08-27 NOTE — Progress Notes (Addendum)
PROGRESS NOTE    Virginia [name]  ZOX:[id number] DOB: [birth year] DOA: 08/26/2023 PCP: [name], MD    Chief Complaint  Patient presents with   Chest Pain    Brief Narrative:   Virginia [name] is a 67 y.o. female with medical history significant of hypertension, hyperlipidemia, metastatic non-small cell lung cancer with metastasis brain on chemotherapy, anemia, cardiac arrest secondary to VF, history of DVT longer on anticoagulation due to intracranial bleeding, anxiety, tobacco abuse, and GERD who presented with complaints of Sudden onset of chest and back pain, CTA chest significant for new PE, she is with new oxygen requirement as well, she is admitted for further workup. .    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 -81% on room air on presentation, this morning she is on 5 L oxygen, was able to wean to 3 L, she was encouraged use incentive spirometer. -Difficult situation here, patient with acute segmental PE, with known history of brain metastasis, (possible suspected radiation necrosis), I have discussed with patient-husband today risks and benefits, at this point her risk of  progression of her PE-VTE very significant given her malignancy, poor ambulatory status, and that significantly at weight her risk of femur conversion of her metastatic disease, agree with neuro oncology evaluation and recommendation to continue with full anticoagulation.  They are both agreeable to this, understands risks and benefits. -Watch her on heparin GTT for another 24 to 48 hours, if stable, then can transition to Eliquis, likely will go with Eliquis 5 mg p.o. twice daily with no loading dose. -No evidence of lower extremity DVT, no role for IVC filter here.     Metastatic lung cancer with mets to the brain  Patient receiving ongoing care with oncology with Dr. [name] and Dr. Barbaraann Cao.  Recent MRI of the brain had showed progression of supratentorial lesions.  CT angiogram of the chest had noted concern for possible lesion of the sternum versus secondary to previous history of CPR. -Continue Decadron at 1 mg daily per Dr. Barbaraann Cao recommendation -Dr. [name] and Dr. Barbaraann Cao of oncology notified of patient's admission into the hospital. -Palliative care consulted for need of pain control   History of DVT Patient had prior history of DVT of the left leg back in 2021 for which patient was on Eliquis, but records note this was discontinued due to suspected radiation necrosis.  As of last Doppler ultrasound of lower extremity performed on 06/05/2023 patient had a left posterior tibial vein thrombus still present. -Remedy venous Dopplers with no evidence of DVT, no indication for VCE.   Heart failure with reduced ejection fraction Chronic.  Patient not appear grossly fluid overloaded at this time.  BMP 98.3 F last echocardiogram  noted EF to be 45 to 50% with grade 1 diastolic dysfunction.  -Strict I&Os and daily weights   Chronic pain History of thoracic compression fracture Family notes patient having history of chronic back pain related to thoracic compression fracture of the thoracic spine for  which she is on oxycodone. -Continue current pain regimen -Dilaudid IV as needed for severe pain.   Essential hypertension -Held spironolactone due to concern for renal insufficiency -Resume metoprolol.   Diabetes mellitus type 2, without long-term use of insulin Glucose 132. Last available hemoglobin A1c was 7.3 when checked on 9/23.  Thought to be secondary to steroids.  She is not on any medications for treatment. -Continue to monitor glucose levels and start sliding scale of insulin if warranted   Renal insufficiency Creatinine noted to be 1.51 with BUN 39.  Baseline creatinine previously noted to be 1.1-1.3. -Hold possible nephrotoxic agents  DVT prophylaxis: Heparin drip Code Status: Full Family Communication:  Discussed with husband at bedside Disposition:   Status is: Inpatient    Consultants:  Neuro-oncology Dr. Barbaraann Cao  Subjective:  She denies any chest pain, shortness of breath, headache  Objective: Vitals:   08/27/23 0800 08/27/23 1021 08/27/23 1245 08/27/23 1509  BP: (!) 133/96     Pulse: (!) 108   100  Resp: 16     Temp: 98.4 F (36.9 C)     TempSrc: Oral     SpO2: 96% 91% 94% 97%  Weight:      Height:        Intake/Output Summary (Last 24 hours) at 08/27/2023 1533 Last data filed at 08/26/2023 2338 Gross per 24 hour  Intake 120 ml  Output --  Net 120 ml   Filed Weights   08/26/23 0400 08/26/23 1845  Weight: 70.7 kg 71.9 kg    Examination:  Awake Alert, Oriented X 3, Rales Symmetrical Chest wall movement, Rales at the bases RRR,No Gallops,Rubs or new Murmurs, No Parasternal Heave +ve B.Sounds, Abd Soft, No tenderness, No rebound - guarding or rigidity. No Cyanosis, Clubbing or edema, No new Rash or bruise      Data Reviewed: I have personally reviewed following labs and imaging studies  CBC: Recent Labs  Lab 08/26/23 0053 08/26/23 0506 08/27/23 0120  WBC 12.7* 14.5* 16.9*  NEUTROABS 6.5  --   --   HGB 11.5* 10.7* 10.8*  HCT  34.1* 32.5* 32.1*  MCV 98.3 98.5 98.5  PLT 261 241 261    Basic Metabolic Panel: Recent Labs  Lab 08/26/23 0053  NA 138  K 3.9  CL 99  CO2 28  GLUCOSE 132*  BUN 39*  CREATININE 1.51*  CALCIUM 9.2    GFR: Estimated Creatinine Clearance: 34 mL/min (A) (by C-G formula based on SCr of 1.51 mg/dL (H)).  Liver Function Tests: Recent Labs  Lab 08/26/23 0053  AST 35  ALT 41  ALKPHOS 47  BILITOT 0.3  PROT 6.5  ALBUMIN 2.6*    CBG: No results for input(s): "GLUCAP" in the last 168 hours.   No results found for this or any previous visit (from the past 240 hours).       Radiology Studies: VAS [name] LOWER EXTREMITY VENOUS (DVT) Result Date: 08/26/2023  Lower Venous DVT Study Patient Name:  RACQUELLE EALES  Date of Exam:   08/26/2023 Medical Rec #: [id number]          Accession #:    [id number] Date of Birth: 08-Jul-1956  Patient Gender: F Patient Age:   32 years Exam Location:  [facility] Procedure:      VAS [name] LOWER EXTREMITY VENOUS (DVT) Referring Phys: Madelyn Flavors --------------------------------------------------------------------------------  Indications: Pulmonary embolism.  Risk Factors: Chemotherapy/metastatic cancer, hx of DVT. Limitations: Poor ultrasound/tissue interface. Comparison Study: Previous exam on 06/05/23 was positive for age indeterminate                   DVT in left PTV Performing Technologist: Ernestene Mention RVT, RDMS  Examination Guidelines: A complete evaluation includes B-mode imaging, spectral Doppler, color Doppler, and power Doppler as needed of all accessible portions of each vessel. Bilateral testing is considered an integral part of a complete examination. Limited examinations for reoccurring indications may be performed as noted. The reflux portion of the exam is performed with the patient in reverse Trendelenburg.  +---------+---------------+---------+-----------+----------+--------------+ RIGHT     CompressibilityPhasicitySpontaneityPropertiesThrombus Aging +---------+---------------+---------+-----------+----------+--------------+ CFV      Full           Yes      Yes                                 +---------+---------------+---------+-----------+----------+--------------+ SFJ      Full                                                        +---------+---------------+---------+-----------+----------+--------------+ FV Prox  Full           Yes      Yes                                 +---------+---------------+---------+-----------+----------+--------------+ FV Mid   Full           Yes      Yes                                 +---------+---------------+---------+-----------+----------+--------------+ FV DistalFull           Yes      Yes                                 +---------+---------------+---------+-----------+----------+--------------+ PFV      Full                                                        +---------+---------------+---------+-----------+----------+--------------+ POP      Full           Yes      Yes                                 +---------+---------------+---------+-----------+----------+--------------+ PTV      Full                                                        +---------+---------------+---------+-----------+----------+--------------+  PERO     Full                                                        +---------+---------------+---------+-----------+----------+--------------+   +---------+---------------+---------+-----------+----------+-------------------+ LEFT     CompressibilityPhasicitySpontaneityPropertiesThrombus Aging      +---------+---------------+---------+-----------+----------+-------------------+ CFV      Full           Yes      Yes                                      +---------+---------------+---------+-----------+----------+-------------------+ SFJ      Full                                                              +---------+---------------+---------+-----------+----------+-------------------+ FV Prox  Full           Yes      Yes                                      +---------+---------------+---------+-----------+----------+-------------------+ FV Mid   Full           Yes      Yes                                      +---------+---------------+---------+-----------+----------+-------------------+ FV DistalFull           Yes      Yes                                      +---------+---------------+---------+-----------+----------+-------------------+ PFV      Full                                                             +---------+---------------+---------+-----------+----------+-------------------+ POP      Full           Yes      Yes                                      +---------+---------------+---------+-----------+----------+-------------------+ PTV      Full                                                             +---------+---------------+---------+-----------+----------+-------------------+ PERO     Full  Not well visualized +---------+---------------+---------+-----------+----------+-------------------+     Summary: BILATERAL: - No evidence of deep vein thrombosis seen in the lower extremities, bilaterally. -No evidence of popliteal cyst, bilaterally.   *See table(s) above for measurements and observations. Electronically signed by Gerarda Fraction on 08/26/2023 at 6:45:03 PM.    Final    CT Angio Chest PE W/Cm &/Or Wo Cm Result Date: 08/26/2023 CLINICAL DATA:  Pulmonary embolism, chest pain EXAM: CT ANGIOGRAPHY CHEST WITH CONTRAST TECHNIQUE: Multidetector CT imaging of the chest was performed using the standard protocol during bolus administration of intravenous contrast. Multiplanar CT image reconstructions and MIPs were obtained to evaluate the vascular anatomy. RADIATION  DOSE REDUCTION: This exam was performed according to the departmental dose-optimization program which includes automated exposure control, adjustment of the mA and/or kV according to patient size and/or use of iterative reconstruction technique. CONTRAST:  50mL OMNIPAQUE IOHEXOL 350 MG/ML SOLN COMPARISON:  08/23/2023 FINDINGS: Cardiovascular: There is adequate opacification of the pulmonary arterial tree. Several small intraluminal filling defects are identified within the basal segmental pulmonary arteries of the right lower lobe in keeping with acute pulmonary emboli. The embolic burden is small. The central pulmonary arteries are of normal caliber. There is mild global cardiomegaly with left ventricular enlargement. No CT evidence of right heart strain. Mild coronary artery calcification. Small pericardial effusion has increased in size since prior examination. Moderate atherosclerotic calcification within the thoracic aorta. No aortic aneurysm. Mediastinum/Nodes: No enlarged mediastinal, hilar, or axillary lymph nodes. Thyroid gland, trachea, and esophagus demonstrate no significant findings. Lungs/Pleura: Bibasilar atelectasis with lateral segmental collapse of the right lower lobe. Stable post treatment changes within the medial a right lower lobe abutting the pleural surface and extending into the azygoesophageal recess. A trace right pleural effusion. No confluent pulmonary infiltrate. No pneumothorax. No central obstructing lesion. Upper Abdomen: No acute abnormality. Musculoskeletal: T8 vertebroplasty. No acute bone abnormality. Mixed lytic and sclerotic lesion within the mid body of the sternum suspicious for a solitary osseous metastasis. This appears new since more remote prior examination of 06/03/2023 Review of the MIP images confirms the above findings. IMPRESSION: 1. Acute pulmonary emboli within the basal segmental pulmonary arteries of the right lower lobe. The embolic burden is small. No CT  evidence of right heart strain. 2. Trace right pleural effusion, possibly reactive in nature. 3. Mild global cardiomegaly with left ventricular enlargement. Mild coronary artery calcification. 4. Small pericardial effusion, increased in size since prior examination. 5. Stable post treatment changes within the medial right lower lobe. 6. Mixed lytic and sclerotic lesion within the mid body of the sternum suspicious for a solitary osseous metastasis. This appears new since more remote prior examination of 06/03/2023. PET CT examination may be helpful for further evaluation. Aortic Atherosclerosis (ICD10-I70.0). Electronically Signed   By: Helyn Numbers M.D.   On: 08/26/2023 03:55   DG Chest Port 1 View Result Date: 08/26/2023 CLINICAL DATA:  Chest pain EXAM: PORTABLE CHEST 1 VIEW COMPARISON:  Radiographs 06/28/2023 and CT 08/23/2023 FINDINGS: Stable cardiomegaly. Aortic atherosclerotic calcification. Bibasilar atelectasis/scarring. Otherwise no focal consolidation. No pleural effusion or pneumothorax. Vertebroplasty of a midthoracic vertebral body. No displaced rib fractures. IMPRESSION: No active disease. Cardiomegaly. Electronically Signed   By: Minerva Fester M.D.   On: 08/26/2023 01:26        Scheduled Meds:  (feeding supplement) PROSource Plus  30 mL Oral TID PC & HS   dexamethasone  1 mg Oral Daily   [START ON 08/28/2023] feeding supplement  237 mL Oral  BID BM   levETIRAcetam  1,000 mg Oral BID   oxyCODONE  15 mg Oral Q12H   pantoprazole  40 mg Oral Daily   sodium chloride flush  3 mL Intravenous Q12H   Continuous Infusions:  heparin 1,100 Units/hr (08/27/23 0833)     LOS: 1 day      Huey Bienenstock, MD [facility]   To contact the attending provider between 7A-7P or the covering provider during after hours 7P-7A, please log into the web site [email or web address] and access using universal [place] password for that web site. If you do not have the password, please call the  hospital operator.  08/27/2023, 3:33 PM

## 2023-08-27 NOTE — Progress Notes (Addendum)
   08/27/23 1441  TOC Brief Assessment  Insurance and Status Reviewed (Medicare A and B)  Patient has primary care physician Yes (Virginia [name], Virginia Cole, MD)  Home environment has been reviewed From home with Husband  Prior level of function: independent with assitance from Husband  Prior/Current Home Services Current home services [facility])  Social Drivers of Health Review SDOH reviewed no interventions necessary  Readmission risk has been reviewed Yes (42%)  Transition of care needs transition of care needs identified, TOC will continue to follow   Met with patient and Husband bedside- Patient current with [facility] and will resume after DC.  Also has an aide that comes 2X  a week for bathing. Patient has RW/ [facility] and hospital bed  at home. Was interested in [employer] - RNCM explained that was an out of pocket item and could be found online.   TOC will continue to follow patient for any additional discharge needs

## 2023-08-27 NOTE — Consult Note (Signed)
Palliative Care Consult Note                                  Date: 08/27/2023   Patient Name: Virginia [name]  DOB: 1956/09/05  MRN: [id number]  Age / Sex: 67 y.o., female  PCP: [name], MD Referring Physician: Starleen Arms, MD  Reason for Consultation: {Reason for Consult:23484}  HPI/Patient Profile: 67 y.o. female  with past medical history of *** admitted on 08/26/2023 with ***.   Past Medical History:  Diagnosis Date   Anemia    Anxiety    Concussion 09/28/2019   DVT (deep venous thrombosis) (HCC) 2021   L leg   Dyspnea    GERD (gastroesophageal reflux disease)    Hypercholesterolemia    per pt, she does not have elevated lipids   Hypertension    met lung ca dx'd 09/2019   mets to spine, hip and brain   PONV (postoperative nausea and vomiting)    Tobacco abuse     Subjective:   This NP Wynne Dust reviewed medical records, received report from team, assessed the patient and then meet at the patient's bedside to discuss diagnosis, prognosis, GOC, EOL wishes disposition and options.  I met with ***.   We meet to discuss diagnosis prognosis, GOC, EOL wishes, disposition and options. Concept of Palliative Care was introduced as specialized medical care for people and their families living with serious illness.  If focuses on providing relief from the symptoms and stress of a serious illness.  The goal is to improve quality of life for both the patient and the family. Values and goals of care important to patient and family were attempted to be elicited.  ***  Created space and opportunity for patient  and family to explore thoughts and feelings regarding current medical situation   Natural trajectory and current clinical status were discussed. Questions and concerns addressed. Patient  encouraged to call with questions or concerns.    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)  Heart failure with reduced ejection fraction (HCC)  Chronic pain  Essential hypertension  Type 2 diabetes mellitus with complication, without long-term current use of insulin (HCC)   Physical Exam  Vital Signs:  BP (!) 133/96 (BP Location: Right Arm)   Pulse (!) 108   Temp 98.4 F (36.9 C) (Oral)   Resp 16   Ht 5\' 2"  (1.575 m)   Wt 71.9 kg   SpO2 91%   BMI 28.99 kg/m   Palliative Assessment/Data: ***    Advanced Care Planning:   Existing Vynca/ACP Documentation: ***  Primary Decision Maker: {Primary Decision MWUXL:24401}  Code Status/Advance Care Planning: {Palliative Code status:23503}  A discussion was had today regarding advanced directives. Concepts specific to code status, artifical feeding and hydration, continued IV antibiotics and rehospitalization was had.  The difference between a aggressive medical intervention path and a palliative comfort care path for this patient at this time was had. ***The MOST form was introduced and discussed.***  Decisions/Changes to ACP: ***  Assessment & Plan:  Impression: ***  SUMMARY OF RECOMMENDATIONS   ***  Symptom Management:  ***  Prognosis:  {Palliative Care Prognosis:23504}  Discharge Planning:  {Palliative dispostion:23505}   Discussed with: ***    Thank you for allowing [name] to participate in the care of MATTELYN QIAO PMT will continue to support holistically.  Time Total: ***  Detailed review of medical records (labs, imaging, vital signs), medically appropriate exam, discussed with treatment team,  counseling and education to patient, family, & staff, documenting clinical information, medication management, coordination of care  Signed by: Wynne Dust, NP Palliative Medicine Team  Team Phone # [phone] (Nights/Weekends)  08/27/2023, 11:01 AM

## 2023-08-27 NOTE — Evaluation (Signed)
Physical Therapy Evaluation Patient Details Name: Virginia [name] MRN: [id number] DOB: March 06, 1956 Today's Date: 08/27/2023  History of Present Illness  Virginia [name] is a 67 y.o. 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 Patient found to have PE.  Clinical Impression  Patient received in bed, spouse at bedside. Patient reporting significant pain in B shoulders. Requires mod encouragement to participate. She is motivated to improve, but pain limited currently. Patient is able to sit up without A, but required max assist to scoot around and forward in bed due to B shoulder pain. Patient is able to stand with +2 hand held assist and took a couple of side steps at edge of bed. Patient will continue to benefit from skilled PT to improve mobility and independence.          If plan is discharge home, recommend the following: A lot of help with walking and/or transfers;A lot of help with bathing/dressing/bathroom;Assist for transportation;Help with stairs or ramp for entrance;Assistance with cooking/housework   Can travel by private vehicle    no    Equipment Recommendations None recommended by PT  Recommendations for Other Services       Functional Status Assessment Patient has had a recent decline in their functional status and demonstrates the ability to make significant improvements in function in a reasonable and predictable amount of time.     Precautions / Restrictions Precautions Precautions: Fall Restrictions Weight Bearing Restrictions Per Provider Order: No       Mobility  Bed Mobility Overal bed mobility: Needs Assistance Bed Mobility: Supine to Sit, Sit to Supine     Supine to sit: Mod assist Sit to supine: Min assist   General bed mobility comments: Max assist to scoot in bed due to shoulder pain.    Transfers Overall transfer level: Needs assistance Equipment used: 2 person hand held assist Transfers: Sit to/from Stand Sit to Stand: Mod assist, +2 physical assistance           General transfer comment: patient able to take a couple of side steps at edge of bed with +2 hand held assist.    Ambulation/Gait                  Stairs            Wheelchair Mobility     Tilt Bed    Modified Rankin (Stroke Patients Only)       Balance Overall balance assessment: Needs assistance Sitting-balance support: Feet supported Sitting balance-Leahy Scale: Good     Standing balance support: Bilateral upper extremity supported, During functional activity, Reliant on assistive device for balance Standing balance-Leahy Scale: Poor                               Pertinent Vitals/Pain Pain Assessment Pain Assessment: Faces Faces Pain Scale: Hurts whole lot Pain Location: B shoulder Pain Descriptors / Indicators: Discomfort, Grimacing Pain Intervention(s): Monitored during session, Repositioned, Patient requesting pain meds-RN notified    Home Living Family/patient expects to be discharged to:: Private residence Living Arrangements: Spouse/significant other Available Help at  Discharge: Family;Available 24 hours/day Type of Home: House       Alternate Level Stairs-Number of Steps: 15 Home Layout: Two level;Bed/bath upstairs;1/2 bath on main level;Able to live on main level with bedroom/bathroom Home Equipment: Rollator (4 wheels);Wheelchair - Biomedical scientist (2 wheels);BSC/3in1;Hospital bed      Prior Function Prior Level of Function : Needs assist       Physical Assist :  Mobility (physical);ADLs (physical)     Mobility Comments: patient currently wheelchair level. Can stand and transfer with assist. Can walk short distances with assistance and RW. ADLs Comments: requires assistance from husband     Extremity/Trunk Assessment   Upper Extremity Assessment Upper Extremity Assessment: Generalized weakness    Lower Extremity Assessment Lower Extremity Assessment: Generalized weakness    Cervical / Trunk Assessment Cervical / Trunk Assessment: Normal  Communication   Communication Communication: No apparent difficulties Cueing Techniques: Verbal cues  Cognition Arousal: Alert Behavior During Therapy: WFL for tasks assessed/performed Overall Cognitive Status: Within Functional Limits for tasks assessed                                          General Comments      Exercises     Assessment/Plan    PT Assessment Patient needs continued PT services  PT Problem List Decreased strength;Decreased activity tolerance;Decreased balance;Pain;Decreased mobility       PT Treatment Interventions DME instruction;Functional mobility training;Therapeutic activities;Patient/family education;Balance training;Therapeutic exercise    PT Goals (Current goals can be found in the Care Plan section)  Acute Rehab PT Goals Patient Stated Goal: to return home PT Goal Formulation: With patient/family Time For Goal Achievement: 09/10/23 Potential to Achieve Goals: Good    Frequency Min 1X/week     Co-evaluation               AM-PAC PT "6 Clicks" Mobility  Outcome Measure Help needed turning from your back to your side while in a flat bed without using bedrails?: A Lot Help needed moving from lying on your back to sitting on the side of a flat bed without using bedrails?: A Lot Help needed moving to and from a bed to a chair (including a wheelchair)?: A Lot Help needed standing up from a chair using your arms (e.g., wheelchair or  bedside chair)?: A Lot Help needed to walk in hospital room?: Total Help needed climbing 3-5 steps with a railing? : Total 6 Click Score: 10    End of Session Equipment Utilized During Treatment: Oxygen Activity Tolerance: Patient limited by pain Patient left: in bed;with call bell/phone within reach;with family/visitor present Nurse Communication: Mobility status PT Visit Diagnosis: Other abnormalities of gait and mobility (R26.89);Muscle weakness (generalized) (M62.81);Pain;Unsteadiness on feet (R26.81) Pain - Right/Left:  (B) Pain - part of body: Shoulder    Time: 4098-1191 PT Time Calculation (min) (ACUTE ONLY): 17 min   Charges:   PT Evaluation $PT Eval Moderate Complexity: 1 Mod PT Treatments $Therapeutic Activity: 8-22 mins PT General Charges $$ ACUTE PT VISIT: 1 Visit        Mansoor Hillyard, PT, GCS 08/27/23,10:21 AM

## 2023-08-27 NOTE — Plan of Care (Signed)

## 2023-08-27 NOTE — Progress Notes (Addendum)
PHARMACY - ANTICOAGULATION CONSULT NOTE  Pharmacy Consult for IV heparin Indication: pulmonary embolus  Allergies  Allergen Reactions   Scopolamine Rash    Patient Measurements: Height: 5\' 2"  (157.5 cm) Weight: 71.9 kg (158 lb 8.2 oz) IBW/kg (Calculated) : 50.1 Heparin Dosing Weight: 65 kg  Vital Signs: Temp: 97.9 F (36.6 C) (12/17 0000) Temp Source: Oral (12/17 0000) BP: 112/82 (12/17 0400) Pulse Rate: 108 (12/17 0400)  Labs: Recent Labs    08/26/23 0053 08/26/23 0331 08/26/23 0506 08/26/23 1100 08/26/23 1800 08/27/23 0120  HGB 11.5*  --  10.7*  --   --  10.8*  HCT 34.1*  --  32.5*  --   --  32.1*  PLT 261  --  241  --   --  261  HEPARINUNFRC  --   --   --  0.16* 0.44 0.33  CREATININE 1.51*  --   --   --   --   --   TROPONINIHS 14 14  --   --   --   --     Estimated Creatinine Clearance: 34 mL/min (A) (by C-G formula based on SCr of 1.51 mg/dL (H)).   Medical History: Past Medical History:  Diagnosis Date   Anemia    Anxiety    Concussion 09/28/2019   DVT (deep venous thrombosis) (HCC) 2021   L leg   Dyspnea    GERD (gastroesophageal reflux disease)    Hypercholesterolemia    per pt, she does not have elevated lipids   Hypertension    met lung ca dx'd 09/2019   mets to spine, hip and brain   PONV (postoperative nausea and vomiting)    Tobacco abuse     Assessment: Virginia [name] is a 67 y.o. year old female presents on 08/26/2023 with shortness of breath and chest pain now with concern for new PE. PMH significant for metastatic non-small cell lung cancer with evidence of progression of brain mets per last MRI. CT on 12/16 with small segmental PE of the right lower lobe with right heart strain. No anticoagulation prior to admission. Previously on eliquis in 2021 for DVT which was discontinued due to cerebral hemorrhage. Pt is very high risk for anticoagulation which was discussed with family and patient at bedside. Will utilize a very conservative  dosing strategies with lower heparin goal and no bolus per discussion with EDP.   Heparin level therapeutic, however, at lower end of goal at 0.33 on 1050 units/hr. Hgb 10.9, plt wnl. No signs of bleeding noted.    Goal of Therapy:  Heparin level 0.3-0.5 units/ml Monitor platelets by anticoagulation protocol: Yes   Plan:  Increase heparin IV gtt to 1100 units/hr  Daily anti-Xa level and CBC while on heparin  Monitor for S/sx of bleeding F/u starting oral anticoagulation once cleared per oncology's recommendations   Thank you for involving pharmacy in the patient's care.   Roslyn Smiling, PharmD PGY1 Pharmacy Resident 08/27/2023 8:04 AM

## 2023-08-27 NOTE — Progress Notes (Signed)
   Brief Palliative Medicine Progress Note:  PMT consult received and chart reviewed. Goals of care completed, full note to follow.  Recommendations: Remain full code Full scope of care Husband is aware of patient's wishes for no prolonged artificial life support They are agreeable to rediscuss goals of care if/when her health declines Agreeable to referral to outpatient palliative medicine clinic in the cancer center Palliative medicine will continue to follow   Thank you for allowing [name] to participate in the care of Virginia [name]  Signed by: Wynne Dust, NP Palliative Medicine Team [facility] CHARGE  Team Phone # [phone] (Nights/Weekends)  08/27/2023, 5:01 PM

## 2023-08-28 ENCOUNTER — Other Ambulatory Visit (HOSPITAL_COMMUNITY): Payer: Self-pay

## 2023-08-28 ENCOUNTER — Telehealth (HOSPITAL_COMMUNITY): Payer: Self-pay | Admitting: Pharmacy Technician

## 2023-08-28 ENCOUNTER — Other Ambulatory Visit: Payer: Self-pay | Admitting: Nurse Practitioner

## 2023-08-28 DIAGNOSIS — Z86718 Personal history of other venous thrombosis and embolism: Secondary | ICD-10-CM | POA: Diagnosis not present

## 2023-08-28 DIAGNOSIS — J9601 Acute respiratory failure with hypoxia: Secondary | ICD-10-CM | POA: Diagnosis not present

## 2023-08-28 DIAGNOSIS — C3490 Malignant neoplasm of unspecified part of unspecified bronchus or lung: Secondary | ICD-10-CM | POA: Diagnosis not present

## 2023-08-28 DIAGNOSIS — C7931 Secondary malignant neoplasm of brain: Secondary | ICD-10-CM | POA: Diagnosis not present

## 2023-08-28 LAB — CBC
HCT: 27.8 % — ABNORMAL LOW (ref 36.0–46.0)
Hemoglobin: 9.3 g/dL — ABNORMAL LOW (ref 12.0–15.0)
MCH: 32.6 pg (ref 26.0–34.0)
MCHC: 33.5 g/dL (ref 30.0–36.0)
MCV: 97.5 fL (ref 80.0–100.0)
Platelets: 304 10*3/uL (ref 150–400)
RBC: 2.85 MIL/uL — ABNORMAL LOW (ref 3.87–5.11)
RDW: 11.8 % (ref 11.5–15.5)
WBC: 12.6 10*3/uL — ABNORMAL HIGH (ref 4.0–10.5)
nRBC: 0 % (ref 0.0–0.2)

## 2023-08-28 LAB — BASIC METABOLIC PANEL
Anion gap: 10 (ref 5–15)
BUN: 31 mg/dL — ABNORMAL HIGH (ref 8–23)
CO2: 26 mmol/L (ref 22–32)
Calcium: 7.7 mg/dL — ABNORMAL LOW (ref 8.9–10.3)
Chloride: 97 mmol/L — ABNORMAL LOW (ref 98–111)
Creatinine, Ser: 1.34 mg/dL — ABNORMAL HIGH (ref 0.44–1.00)
GFR, Estimated: 44 mL/min — ABNORMAL LOW (ref >60)
Glucose, Bld: 129 mg/dL — ABNORMAL HIGH (ref 70–99)
Potassium: 3.6 mmol/L (ref 3.5–5.1)
Sodium: 133 mmol/L — ABNORMAL LOW (ref 135–145)

## 2023-08-28 LAB — HEPARIN LEVEL (UNFRACTIONATED): Heparin Unfractionated: 0.18 [IU]/mL — ABNORMAL LOW (ref 0.30–0.70)

## 2023-08-28 LAB — MAGNESIUM: Magnesium: 1.8 mg/dL (ref 1.7–2.4)

## 2023-08-28 LAB — PHOSPHORUS: Phosphorus: 3.9 mg/dL (ref 2.5–4.6)

## 2023-08-28 MED ORDER — OXYCODONE HCL 5 MG PO TABS
5.0000 mg | ORAL_TABLET | ORAL | Status: DC | PRN
  Administered 2023-08-28 (×2): 10 mg via ORAL
  Administered 2023-08-28: 5 mg via ORAL
  Filled 2023-08-28: qty 2
  Filled 2023-08-28 (×2): qty 1
  Filled 2023-08-28: qty 2
  Filled 2023-08-28: qty 1

## 2023-08-28 MED ORDER — APIXABAN 5 MG PO TABS
5.0000 mg | ORAL_TABLET | Freq: Two times a day (BID) | ORAL | Status: DC
  Administered 2023-08-28 – 2023-08-29 (×3): 5 mg via ORAL
  Filled 2023-08-28 (×3): qty 1

## 2023-08-28 MED ORDER — ADULT MULTIVITAMIN W/MINERALS CH
1.0000 | ORAL_TABLET | Freq: Every day | ORAL | Status: DC
  Administered 2023-08-28 – 2023-08-29 (×2): 1 via ORAL
  Filled 2023-08-28 (×2): qty 1

## 2023-08-28 NOTE — Evaluation (Addendum)
Occupational Therapy Evaluation Patient Details Name: Virginia [name] MRN: [id number] DOB: 03/31/1956 Today's Date: 08/28/2023   History of Present Illness Virginia [name] is a 67 y.o. female with medical history significant of hypertension, hyperlipidemia, metastatic non-small cell lung cancer with metastasis brain on chemotherapy, anemia, cardiac arrest secondary to VF, history of DVT longer on anticoagulation due to intracranial bleeding, anxiety, tobacco abuse, and GERD who presented to [facility] 08/26/23 with complaints of sudden onset of chest and back pain that started after dinner the previous night around 8:30 or 9 PM. Patient found to have PE started on heparin 12/16.   Clinical Impression   Pt has assist at baseline with ADLs and functional mobility. Lives with spouse who assists. Pt mobilizes with w/c and pivot transfers with assist of spouse. Pt currently needing set up -max A for ADLs, mod A for bed mobility and mod A for transfers with 2 person HHA. Pt performs pericare in standing and is able to take a few side steps to head of bed. Pt presenting with impairments listed below, will follow acutely. Recommend HHOT at d/c given spouse can provide level of assist needed at home.      If plan is discharge home, recommend the following: A little help with bathing/dressing/bathroom;A lot of help with walking and/or transfers;Assistance with cooking/housework    Functional Status Assessment  Patient has had a recent decline in their functional status and demonstrates the ability to make significant improvements in function in a reasonable and predictable amount of time.  Equipment Recommendations  None recommended by OT    Recommendations for Other Services PT consult     Precautions / Restrictions Precautions Precautions: Fall Restrictions Weight Bearing Restrictions Per Provider Order: No      Mobility Bed Mobility Overal bed mobility: Needs Assistance Bed Mobility:  Supine to Sit, Sit to Supine     Supine to sit: Mod assist Sit to supine: Mod assist        Transfers Overall transfer level: Needs assistance Equipment used: 2 person hand held assist Transfers: Sit to/from Stand Sit to Stand: Mod assist           General transfer comment: face to face HHA      Balance Overall balance assessment: Needs assistance Sitting-balance support: Feet supported Sitting balance-Leahy Scale: Good     Standing balance support: Bilateral upper extremity supported, During functional activity, Reliant on assistive device for balance Standing balance-Leahy Scale: Poor                             ADL either performed or assessed with clinical judgement   ADL Overall ADL's : Needs assistance/impaired Eating/Feeding: Set up   Grooming: Set up   Upper Body Bathing: Minimal assistance   Lower Body Bathing: Maximal assistance   Upper Body Dressing : Supervision/safety;Minimal assistance   Lower Body Dressing: Maximal assistance   Toilet Transfer: Moderate assistance   Toileting- Clothing Manipulation and Hygiene: Contact guard assist       Functional mobility during ADLs: Moderate assistance       Vision   Vision Assessment?: No apparent visual deficits     Perception Perception: Not tested       Praxis Praxis: Not tested       Pertinent Vitals/Pain Pain Assessment Pain Assessment: Faces Pain Score: 9  Faces Pain Scale: Hurts whole lot Pain Location: bil shoulders Pain Descriptors / Indicators: Discomfort, Grimacing Pain Intervention(s):  Limited activity within patient's tolerance, Monitored during session, Repositioned     Extremity/Trunk Assessment Upper Extremity Assessment Upper Extremity Assessment: Generalized weakness (bil shoulder pain)   Lower Extremity Assessment Lower Extremity Assessment: Generalized weakness   Cervical / Trunk Assessment Cervical / Trunk Assessment: Normal   Communication  Communication Communication: No apparent difficulties   Cognition Arousal: Alert Behavior During Therapy: WFL for tasks assessed/performed Overall Cognitive Status: Within Functional Limits for tasks assessed                                       General Comments  VSS on RA    Exercises     Shoulder Instructions      Home Living Family/patient expects to be discharged to:: Private residence Living Arrangements: Spouse/significant other Available Help at Discharge: Family;Available 24 hours/day Type of Home: House Home Access: Stairs to enter     Home Layout: Two level;Bed/bath upstairs;1/2 bath on main level;Able to live on main level with bedroom/bathroom     Bathroom Shower/Tub: Walk-in shower;Tub/shower unit   Bathroom Toilet: Handicapped height     Home Equipment: Rollator (4 wheels);Wheelchair - Biomedical scientist (2 wheels);BSC/3in1;Hospital bed   Additional Comments: [facility] HHOT/PT      Prior Functioning/Environment               Mobility Comments: patient currently wheelchair level. Can stand and transfer with assist. Can walk short distances with assistance and RW. ADLs Comments: bathes in the bed, assist for ADLs        OT Problem List: Decreased strength;Decreased range of motion;Decreased activity tolerance;Impaired balance (sitting and/or standing)      OT Treatment/Interventions: Self-care/ADL training;Therapeutic exercise;Energy conservation;DME and/or AE instruction;Therapeutic activities;Patient/family education;Balance training    OT Goals(Current goals can be found in the care plan section) Acute Rehab OT Goals Patient Stated Goal: none stated OT Goal Formulation: With patient Time For Goal Achievement: 09/11/23 Potential to Achieve Goals: Good  OT Frequency: Min 1X/week    Co-evaluation              AM-PAC OT "6 Clicks" Daily Activity     Outcome Measure Help from another person eating  meals?: A Little Help from another person taking care of personal grooming?: A Little Help from another person toileting, which includes using toliet, bedpan, or urinal?: A Lot Help from another person bathing (including washing, rinsing, drying)?: A Lot Help from another person to put on and taking off regular upper body clothing?: A Lot Help from another person to put on and taking off regular lower body clothing?: A Lot 6 Click Score: 14   End of Session Equipment Utilized During Treatment: Gait belt Nurse Communication: Mobility status  Activity Tolerance: Patient tolerated treatment well Patient left: in bed;with call bell/phone within reach;with bed alarm set;with family/visitor present  OT Visit Diagnosis: Unsteadiness on feet (R26.81);Other abnormalities of gait and mobility (R26.89);Muscle weakness (generalized) (M62.81)                Time: 1610-9604 OT Time Calculation (min): 36 min Charges:  OT General Charges $OT Visit: 1 Visit OT Evaluation $OT Eval Moderate Complexity: 1 Mod OT Treatments $Self Care/Home Management : 8-22 mins  Carver Fila, OTD, OTR/L SecureChat Preferred Acute Rehab [phone]   Carver Fila Koonce 08/28/2023, 10:37 AM

## 2023-08-28 NOTE — Telephone Encounter (Signed)
Patient Product/process development scientist completed.    The patient is insured through [employer]. Patient has Medicare and is not eligible for a copay card, but may be able to apply for patient assistance, if available.    Ran test claim for Eliquis 5 mg and the current 30 day co-pay is $139.76 due to being in Coverage Gap (donut hole).   This test claim was processed through [facility]- copay amounts may vary at other pharmacies due to pharmacy/plan contracts, or as the patient moves through the different stages of their insurance plan.     Roland Earl, CPHT Pharmacy Technician III Certified Patient Advocate [facility] Patient Advocate Team Direct Number: [phone]  Fax: [phone]

## 2023-08-28 NOTE — Progress Notes (Signed)
Initial Nutrition Assessment  DOCUMENTATION CODES:   Not applicable  INTERVENTION:  Liberalize diet Ensure Plus High Protein po BID, each supplement provides 350 kcal and 20 grams of protein. Prosource 30ml BID Multivitamin with minerals   NUTRITION DIAGNOSIS:   Increased nutrient needs related to cancer and cancer related treatments as evidenced by estimated needs.    GOAL:   Patient will meet greater than or equal to 90% of their needs    MONITOR:   PO intake, Supplement acceptance  REASON FOR ASSESSMENT:   Consult Assessment of nutrition requirement/status  ASSESSMENT: 67 y.o. F presented form home with complaints of sudden onset of chest and back pain that started right after dinner the night before. 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 Noted to have recent MRI of brain that showed disease progression. Pt resting in bed husband at bed side. Very pleasant couple. She stated that her appetite had been good up until a few days.  No weight loss.  Usually eats three meals a day. No chewing or swallowing concerns noted at this time.  There is no benefit to excessive dietary restrictions related advanced age, increased nutrient needs. Patient would benefit better from liberalized diet to help meet increased nutrient needs and promote better oral intake.  Admit weight: 71.9 kg Current weight: 71.9 kg  Weight history:  08/26/23 71.9 kg  08/12/23 70.7 kg  07/19/23 66.9 kg  07/16/23 67.6 kg  07/01/23 73 kg  06/08/23 68 kg  05/23/23 63.8 kg  05/17/23 64.7 kg  05/16/23 64.9 kg  04/26/23 65.8 kg    Average Meal Intake: 25-50% stated by pt and husband.  Nutritionally Relevant Medications: Scheduled Meds:  (feeding supplement) PROSource Plus  30 mL Oral TID PC & HS   apixaban  5 mg  Oral BID   cyanocobalamin  500 mcg Oral Daily   dexamethasone  1 mg Oral Daily   feeding supplement  237 mL Oral BID BM    Labs Reviewed    NUTRITION - FOCUSED PHYSICAL EXAM:  Flowsheet Row Most Recent Value  Orbital Region Mild depletion  Upper Arm Region No depletion  Thoracic and Lumbar Region No depletion  Buccal Region No depletion  Temple Region Mild depletion  Clavicle Bone Region No depletion  Clavicle and Acromion Bone Region No depletion  Scapular Bone Region No depletion  Dorsal Hand No depletion  Patellar Region No depletion  Anterior Thigh Region No depletion  Posterior Calf Region No depletion  Edema (RD Assessment) Moderate  Hair Reviewed  Eyes Reviewed  Mouth Reviewed  Skin Reviewed  Nails Reviewed       Diet Order:   Diet Order             Diet Heart Room service appropriate? Yes; Fluid consistency: Thin  Diet effective now                   EDUCATION NEEDS:   Education needs have been addressed  Skin:     Last BM:     Height:   Ht Readings from Last 1 Encounters:  08/26/23 5\' 2"  (1.575 m)    Weight:   Wt Readings from Last 1 Encounters:  08/26/23 71.9 kg    Ideal Body Weight:     BMI:  Body mass index is 28.99 kg/m.  Estimated Nutritional Needs:  Kcal:  2200 - 2525 kcal/d  Protein:  95-110 g/d  Fluid:  107ml/kcal    Jamelle Haring RDN, LDN Clinical Dietitian  Pleas see Amion for contact information

## 2023-08-28 NOTE — Progress Notes (Signed)
PHARMACY - ANTICOAGULATION CONSULT NOTE  Pharmacy Consult for IV heparin > Eliquis Indication: pulmonary embolus  Allergies  Allergen Reactions   Scopolamine Rash    Patient Measurements: Height: 5\' 2"  (157.5 cm) Weight: 71.9 kg (158 lb 8.2 oz) IBW/kg (Calculated) : 50.1 Heparin Dosing Weight: 65 kg  Vital Signs: Temp: 97.8 F (36.6 C) (12/18 0800) Temp Source: Oral (12/18 0800) BP: 116/74 (12/18 0800) Pulse Rate: 78 (12/18 0800)  Labs: Recent Labs    08/26/23 0053 08/26/23 0331 08/26/23 0506 08/26/23 1100 08/26/23 1800 08/27/23 0120 08/28/23 0443  HGB 11.5*  --  10.7*  --   --  10.8* 9.3*  HCT 34.1*  --  32.5*  --   --  32.1* 27.8*  PLT 261  --  241  --   --  261 304  HEPARINUNFRC  --   --   --    < > 0.44 0.33 0.18*  CREATININE 1.51*  --   --   --   --   --  1.34*  TROPONINIHS 14 14  --   --   --   --   --    < > = values in this interval not displayed.    Estimated Creatinine Clearance: 38.3 mL/min (A) (by C-G formula based on SCr of 1.34 mg/dL (H)).   Medical History: Past Medical History:  Diagnosis Date   Anemia    Anxiety    Concussion 09/28/2019   DVT (deep venous thrombosis) (HCC) 2021   L leg   Dyspnea    GERD (gastroesophageal reflux disease)    Hypercholesterolemia    per pt, she does not have elevated lipids   Hypertension    met lung ca dx'd 09/2019   mets to spine, hip and brain   PONV (postoperative nausea and vomiting)    Tobacco abuse     Assessment: Virginia [name] is a 67 y.o. year old female presents on 08/26/2023 with shortness of breath and chest pain now with concern for new PE. PMH significant for metastatic non-small cell lung cancer with evidence of progression of brain mets per last MRI. CT on 12/16 with small segmental PE of the right lower lobe with right heart strain. No anticoagulation prior to admission. Previously on eliquis in 2021 for DVT which was discontinued due to cerebral hemorrhage. Pt is very high risk for  anticoagulation which was discussed with family and patient at bedside. Okay to switch from heparin gtt to Eliquis per primary and oncology.  Heparin level subtherapeutic (0.18) this AM despite rate increase to 1100 units/hr. No issues with infusion per RN. Hgb has downtrended to 9.3, platelets WNL. Will switch to Eliquis this AM but will avoid 10mg  BID dose due to risk of hemorrhagic conversion w/ brain disease per primary and oncology.  Goal of Therapy:  Monitor platelets by anticoagulation protocol: Yes   Plan:  Discontinue heparin Start Eliquis 5mg  PO BID Monitor for S/sx of bleeding & daily CBC  Thank you for involving pharmacy in the patient's care.   Nicole Kindred, PharmD PGY1 Pharmacy Resident 08/28/2023 10:13 AM

## 2023-08-28 NOTE — Progress Notes (Signed)
PROGRESS NOTE        PATIENT DETAILS Name: Virginia [name] Age: 67 y.o. Sex: female Date of Birth: 1956/01/17 Admit Date: 08/26/2023 Admitting Physician Angie Fava, DO ZOX:[id number], Luanna Cole, MD  Brief Summary: Patient is a 67 y.o.  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  Significant events: 12/16>> admit to [facility]-chest pain/hypoxia-PE on CTA chest.  After discussion with neuro-oncology-risk vs benefit discussion-started on IV heparin.  Significant studies: 12/16>> CTA chest: Acute PE within basal segmental pulmonary arteries in the right lower lobe. 12/16>> bilateral lower extremity Doppler: No DVT  Significant microbiology data: None  Procedures: None  Consults: Neuro-oncology Palliative care  Subjective: Lying comfortably in bed-denies any chest pain or shortness of breath.  No chest pain-titrated to room air this morning.  Objective: Vitals: Blood pressure 116/74, pulse 78, temperature 97.8 F (36.6 C), temperature source Oral, resp. rate 17, height 5\' 2"  (1.575 m), weight 71.9 kg, SpO2 93%.   Exam: Gen Exam:Alert awake-not in any distress HEENT:atraumatic, normocephalic Chest: B/L clear to auscultation anteriorly CVS:S1S2 regular Abdomen:soft non tender, non distended Extremities:no edema Neurology: Non focal Skin: no rash  Pertinent Labs/Radiology:    Latest Ref Rng & Units 08/28/2023    4:43 AM 08/27/2023    1:20 AM 08/26/2023    5:06 AM  CBC  WBC 4.0 - 10.5 K/uL 12.6  16.9  14.5   Hemoglobin 12.0 - 15.0 g/dL 9.3  40.9  81.1   Hematocrit 36.0 - 46.0 % 27.8  32.1  32.5   Platelets 150 - 400 K/uL 304   261  241     Lab Results  Component Value Date   NA 133 (L) 08/28/2023   K 3.6 08/28/2023   CL 97 (L) 08/28/2023   CO2 26 08/28/2023      Assessment/Plan: Acute hypoxic respiratory failure secondary to pulmonary embolism Hypoxia improved-titrated to room air today On IV heparin x 48 hours-with plans to transition to Eliquis without loading today.  Will watch closely today-if stable-Home 12/19. Reviewed prior notes from Dr. Elgergawy/neuro-oncology-extensive risk/benefit discussion done with patient/spouse-at this time-felt that the risk of clot propagation/extension in the absence of anticoagulation is higher than the risk of hemorrhagic conversion of her existing brain mets.  Family/patient okay with continuing anticoagulation in the setting with unknown risk.  This was rediscussed with patient/spouse at bedside on 12/18.  History of DVT in 2021 Dopplers negative for DVT Irrespective on anticoagulation  Chronic HFrEF Euvolemic  HTN Stable on metoprolol  DM-2 (A1c 7.3 on 9/23) Being managed with diet/lifestyle Supportive care-monitor CBGs.  Likely.  Metastatic non-small cell cancer of the lung with brain mets Follows with Dr. Harrell Lark Continue Decadron 1 mg daily Oncology/neuro-oncology to follow in the outpatient setting  Possible seizure disorder Keppra  History of out-of-hospital V-fib arrest September 2024 Thought to be due to seizure/aspiration event-reviewed prior notes-not a candidate for invasive intervention Briefly on amiodarone-but that has also been discontinued  Chronic pain History of thoracic compression fractures Continue chronic usual narcotic regimen.  CKD stage IIIb At baseline  Normocytic anemia  Secondary to CKD/acute illness Supportive care-follow CBC  BMI: Estimated body mass index is 28.99 kg/m as calculated from the following:   Height as of this encounter: 5\' 2"  (1.575 m).   Weight as of this encounter: 71.9 kg.   Code  status:   Code Status: Full Code   DVT Prophylaxis: apixaban (ELIQUIS) tablet 5 mg    Family Communication: Spouse at bedside   Disposition Plan: Status is: Inpatient Remains inpatient appropriate because: Severity of illness   Planned Discharge Destination:Home   Diet: Diet Order             Diet Heart Room service appropriate? Yes; Fluid consistency: Thin  Diet effective now                     Antimicrobial agents: Anti-infectives (From admission, onward)    None        MEDICATIONS: Scheduled Meds:  (feeding supplement) PROSource Plus  30 mL Oral TID PC & HS   apixaban  5 mg Oral BID   cyanocobalamin  500 mcg Oral Daily   dexamethasone  1 mg Oral Daily   feeding supplement  237 mL Oral BID BM   levETIRAcetam  1,000 mg Oral BID   metoprolol succinate  25 mg Oral Daily   OLANZapine  2.5 mg Oral QHS   oxyCODONE  15 mg Oral Q12H   pantoprazole  40 mg Oral Daily   sodium chloride flush  3 mL Intravenous Q12H   Continuous Infusions: PRN Meds:.acetaminophen **OR** acetaminophen, ALPRAZolam, bisacodyl, hydrocortisone, HYDROmorphone (DILAUDID) injection, iohexol, naLOXone (NARCAN)  injection, ondansetron **OR** ondansetron (ZOFRAN) IV, oxyCODONE, polyethylene glycol   I have personally reviewed following labs and imaging studies  LABORATORY DATA: CBC: Recent Labs  Lab 08/26/23 0053 08/26/23 0506 08/27/23 0120 08/28/23 0443  WBC 12.7* 14.5* 16.9* 12.6*  NEUTROABS 6.5  --   --   --   HGB 11.5* 10.7* 10.8* 9.3*  HCT 34.1* 32.5* 32.1* 27.8*  MCV 98.3 98.5 98.5 97.5  PLT 261 241 261 304    Basic Metabolic Panel: Recent Labs  Lab 08/26/23 0053 08/28/23 0443  NA 138 133*  K 3.9 3.6  CL 99 97*  CO2 28 26  GLUCOSE 132* 129*  BUN 39* 31*  CREATININE 1.51* 1.34*  CALCIUM 9.2 7.7*  MG  --  1.8  PHOS  --  3.9    GFR: Estimated Creatinine Clearance: 38.3 mL/min (A) (by C-G formula based on SCr of 1.34 mg/dL (H)).  Liver Function  Tests: Recent Labs  Lab 08/26/23 0053  AST 35  ALT 41  ALKPHOS 47  BILITOT 0.3  PROT 6.5  ALBUMIN 2.6*   No results for input(s): "LIPASE", "AMYLASE" in the last 168 hours. No results for input(s): "AMMONIA" in the last 168 hours.  Coagulation Profile: No results for input(s): "INR", "PROTIME" in the last 168 hours.  Cardiac Enzymes: No results for input(s): "CKTOTAL", "CKMB", "CKMBINDEX", "TROPONINI" in the last 168 hours.  BNP (last 3 results) No results for input(s): "PROBNP" in the last 8760 hours.  Lipid Profile: No results for input(s): "CHOL", "HDL", "LDLCALC", "TRIG", "CHOLHDL", "LDLDIRECT" in the last 72 hours.  Thyroid Function Tests: No results for input(s): "TSH", "T4TOTAL", "FREET4", "T3FREE", "THYROIDAB" in the last 72 hours.  Anemia Panel: No results for input(s): "VITAMINB12", "FOLATE", "FERRITIN", "TIBC", "IRON", "RETICCTPCT" in the last 72 hours.  Urine analysis:    Component Value Date/Time   COLORURINE YELLOW 08/26/2023 1237   APPEARANCEUR CLEAR  08/26/2023 1237   LABSPEC 1.030 08/26/2023 1237   PHURINE 6.0 08/26/2023 1237   GLUCOSEU NEGATIVE 08/26/2023 1237   HGBUR LARGE (A) 08/26/2023 1237   BILIRUBINUR NEGATIVE 08/26/2023 1237   BILIRUBINUR NEG 06/12/2019 1639   KETONESUR NEGATIVE 08/26/2023 1237   PROTEINUR 30 (A) 08/26/2023 1237   UROBILINOGEN 0.2 06/12/2019 1639   UROBILINOGEN 0.2 04/01/2014 1006   NITRITE NEGATIVE 08/26/2023 1237   LEUKOCYTESUR NEGATIVE 08/26/2023 1237    Sepsis Labs: Lactic Acid, Venous    Component Value Date/Time   LATICACIDVEN 5.0 (HH) 06/03/2023 1510    MICROBIOLOGY: No results found for this or any previous visit (from the past 240 hours).  RADIOLOGY STUDIES/RESULTS: No results found.   LOS: 2 days   Jeoffrey Massed, MD  [facility]    To contact the attending provider between 7A-7P or the covering provider during after hours 7P-7A, please log into the web site [email or web address] and access using  universal [place] password for that web site. If you do not have the password, please call the hospital operator.  08/28/2023, 10:57 AM

## 2023-08-28 NOTE — Progress Notes (Signed)
     Referral previously received for Virginia [name] for goals of care discussion. Noted most recent palliative in-person assessment dated 08/27/2023 at which time it was recommended to follow from a distance/chart check.   Chart reviewed for Recent provider notes, nurse notes, vitals, and labs and updates received from RN.   At this time patient appears stable, chest pain improved, down to RA today. No plan for in person follow-up at this today. Please contact the palliative medicine provider on service for any new/urgent needs that require our assistance with this patient. We will follow-up in person in a couple days or as requested.   Thank you for your referral and allowing PMT to assist in Virginia [name]'s care.   Wynne Dust, NP Palliative Medicine Team Phone: [phone]  NO CHARGE

## 2023-08-28 NOTE — Discharge Instructions (Signed)
Information on my medicine - ELIQUIS (apixaban)  This medication education was reviewed with me or my healthcare representative as part of my discharge preparation.  The pharmacist that spoke with me during my hospital stay was: Jenita Seashore, [facility]  Why was Eliquis prescribed for you? Eliquis was prescribed to treat blood clots that may have been found in the veins of your legs (deep vein thrombosis) or in your lungs (pulmonary embolism) and to reduce the risk of them occurring again.  What do You need to know about Eliquis ? The dose is ONE 5 mg tablet taken TWICE daily.  Eliquis may be taken with or without food.   Try to take the dose about the same time in the morning and in the evening. If you have difficulty swallowing the tablet whole please discuss with your pharmacist how to take the medication safely.  Take Eliquis exactly as prescribed and DO NOT stop taking Eliquis without talking to the doctor who prescribed the medication.  Stopping may increase your risk of developing a new blood clot.  Refill your prescription before you run out.  After discharge, you should have regular check-up appointments with your healthcare provider that is prescribing your Eliquis.    What do you do if you miss a dose? If a dose of ELIQUIS is not taken at the scheduled time, take it as soon as possible on the same day and twice-daily administration should be resumed. The dose should not be doubled to make up for a missed dose.  Important Safety Information A possible side effect of Eliquis is bleeding. You should call your healthcare provider right away if you experience any of the following: Bleeding from an injury or your nose that does not stop. Unusual colored urine (red or dark brown) or unusual colored stools (red or black). Unusual bruising for unknown reasons. A serious fall or if you hit your head (even if there is no bleeding).  Some medicines may interact with Eliquis and might  increase your risk of bleeding or clotting while on Eliquis. To help avoid this, consult your healthcare provider or pharmacist prior to using any new prescription or non-prescription medications, including herbals, vitamins, non-steroidal anti-inflammatory drugs (NSAIDs) and supplements.  This website has more information on Eliquis (apixaban): http://www.eliquis.com/eliquis/home

## 2023-08-29 ENCOUNTER — Encounter: Payer: Self-pay | Admitting: Hematology and Oncology

## 2023-08-29 ENCOUNTER — Other Ambulatory Visit (HOSPITAL_COMMUNITY): Payer: Self-pay

## 2023-08-29 DIAGNOSIS — E118 Type 2 diabetes mellitus with unspecified complications: Secondary | ICD-10-CM | POA: Diagnosis not present

## 2023-08-29 DIAGNOSIS — C7931 Secondary malignant neoplasm of brain: Secondary | ICD-10-CM | POA: Diagnosis not present

## 2023-08-29 DIAGNOSIS — J9601 Acute respiratory failure with hypoxia: Secondary | ICD-10-CM | POA: Diagnosis not present

## 2023-08-29 DIAGNOSIS — I2699 Other pulmonary embolism without acute cor pulmonale: Secondary | ICD-10-CM | POA: Diagnosis not present

## 2023-08-29 MED ORDER — APIXABAN 5 MG PO TABS
5.0000 mg | ORAL_TABLET | Freq: Two times a day (BID) | ORAL | 2 refills | Status: DC
  Filled 2023-08-29: qty 60, 30d supply, fill #0

## 2023-08-29 NOTE — Plan of Care (Signed)

## 2023-08-29 NOTE — TOC Transition Note (Signed)
Transition of Care [facility]) - Discharge Note   Patient Details  Name: Virginia [name] MRN: [id number] Date of Birth: 05-Dec-1955  Transition of Care [facility]) CM/SW Contact:  Gordy Clement, RN Phone Number: 08/29/2023, 9:12 AM   Clinical Narrative:     Patient will dc to home today  [facility] will resume services Husband to transport  No additional TOC needs           Patient Goals and CMS Choice            Discharge Placement                       Discharge Plan and Services Additional resources added to the After Visit Summary for                                       Social Drivers of Health (SDOH) Interventions SDOH Screenings   Food Insecurity: No Food Insecurity (08/26/2023)  Housing: Low Risk  (08/26/2023)  Transportation Needs: No Transportation Needs (08/26/2023)  Utilities: Not At Risk (08/26/2023)  Depression (PHQ2-9): Low Risk  (07/15/2023)  Tobacco Use: Medium Risk (08/26/2023)     Readmission Risk Interventions    06/06/2023    3:51 PM 05/07/2023    1:35 PM  Readmission Risk Prevention Plan  Transportation Screening Complete Complete  PCP or Specialist Appt within 3-5 Days  Complete  HRI or Home Care Consult  Complete  Social Work Consult for Recovery Care Planning/Counseling  Complete  Palliative Care Screening  Not Applicable  Medication Review [employer]) Complete Complete  HRI or Home Care Consult Complete   SW Recovery Care/Counseling Consult Complete   Palliative Care Screening Complete   Skilled Nursing Facility Not Applicable

## 2023-08-29 NOTE — Progress Notes (Signed)
Toc med delivered to patient.

## 2023-08-29 NOTE — Care Management Important Message (Signed)
Important Message  Patient Details  Name: Virginia [name] MRN: [id number] Date of Birth: Feb 26, 1956   Important Message Given:  Yes - Medicare IM  Patient left prior to IM delivery will mail a copy to the patient home address.    [name] 08/29/2023, 2:57 PM

## 2023-08-29 NOTE — Plan of Care (Signed)

## 2023-08-29 NOTE — Discharge Summary (Signed)
PATIENT DETAILS Name: Virginia [name] Age: 67 y.o. Sex: female Date of Birth: Nov 19, 1955 MRN: [id number]. Admitting Physician: Angie Fava, DO YNW:GNFAOZ, Luanna Cole, MD  Admit Date: 08/26/2023 Discharge date: 08/29/2023  Recommendations for Outpatient Follow-up:  Follow up with PCP in 1-2 weeks Please obtain CMP/CBC in one week Please ensure follow up with oncology/Neuro-oncology  Admitted From:  Home  Disposition: Home health   Discharge Condition: fair  CODE STATUS:   Code Status: Full Code   Diet recommendation:  Diet Order             Diet - low sodium heart healthy           Diet Carb Modified           Diet regular Room service appropriate? Yes; Fluid consistency: Thin  Diet effective now                    Brief Summary: Patient is a 67 y.o.  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   Significant events: 12/16>> admit to [facility]-chest pain/hypoxia-PE on CTA chest.  After discussion with neuro-oncology-risk vs benefit discussion-started on IV heparin.   Significant studies: 12/16>> CTA chest: Acute PE within basal segmental pulmonary arteries in the right lower lobe. 12/16>> bilateral lower extremity Doppler: No DVT   Significant microbiology data: None   Procedures: None   Consults: Neuro-oncology Palliative care  Brief Hospital Course: Acute hypoxic respiratory failure secondary to pulmonary embolism Improved-remains on room air since 12/18. Initially on IV heparin-x 48 hours-transition to Eliquis on 12/18-monitored overnight-no major issues-stable for discharge today.   Reviewed prior notes from Dr.  Elgergawy/neuro-oncology-extensive risk/benefit discussion done with patient/spouse-at this time-felt that the risk of clot propagation/extension in the absence of anticoagulation is higher than the risk of hemorrhagic conversion of her existing brain mets.  Family/patient okay with continuing anticoagulation in the setting with unknown risk.  This was rediscussed with patient/spouse at bedside on 12/18.   History of DVT in 2021 Dopplers negative for DVT Irrespective on anticoagulation   Chronic HFrEF Euvolemic   HTN Stable on metoprolol   DM-2 (A1c 7.3 on 9/23) Being managed with diet/lifestyle   Metastatic non-small cell cancer of the lung with brain mets Follows with Dr. Harrell Lark Continue Decadron 1 mg daily Oncology/neuro-oncology to follow in the outpatient setting   Possible seizure disorder Keppra   History of out-of-hospital V-fib arrest September 2024 Thought to be due to seizure/aspiration event-reviewed prior notes-not a candidate for invasive intervention Briefly on amiodarone-but that has also been discontinued   Chronic pain History of thoracic compression fractures Continue chronic usual narcotic regimen.   CKD stage IIIb At baseline   Normocytic anemia Secondary to CKD/acute illness Supportive care-follow CBC   BMI: Estimated body mass index is 28.99 kg/m as calculated from the following:   Height as of this encounter: 5\' 2"  (1.575 m).   Weight as of this encounter: 71.9 kg.    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 [facility])   History of DVT (deep vein thrombosis)   Heart failure with reduced ejection fraction (HCC)   Chronic pain   Essential hypertension   Type 2 diabetes mellitus with  complication, without long-term current use of insulin (HCC)   Renal insufficiency   Single subsegmental pulmonary embolism without acute cor pulmonale [facility])   Discharge Instructions:  Activity:  As tolerated with Full fall precautions use walker/cane & assistance as needed   Discharge Instructions     Amb Referral to Palliative Care   Complete by: As directed    Current goals clear, will change and agreeable to ongoing discussion as disease evolves. Symptom management as well.   Call MD for:  difficulty breathing, headache or visual disturbances   Complete by: As directed    Call MD for:  extreme fatigue   Complete by: As directed    Diet - low sodium heart healthy   Complete by: As directed    Diet Carb Modified   Complete by: As directed    Discharge instructions   Complete by: As directed    Follow with Primary MD  [name], MD in 1-2 weeks  Follow-up with medical oncology and neuro-oncology at your next previously scheduled appointment.  Please get a complete blood count and chemistry panel checked by your Primary MD at your next visit, and again as instructed by your Primary MD.  Get Medicines reviewed and adjusted: Please take all your medications with you for your next visit with your Primary MD  Laboratory/radiological data: Please request your Primary MD to go over all hospital tests and procedure/radiological results at the follow up, please ask your Primary MD to get all Hospital records sent to his/her office.  In some cases, they will be blood work, cultures and biopsy results pending at the time of your discharge. Please request that your primary care M.D. follows up on these results.  Also Note the following: If you experience worsening of your admission symptoms, develop shortness of breath, life threatening emergency, suicidal or homicidal thoughts you must seek medical attention immediately by calling 911 or calling your MD immediately  if symptoms  less severe.  You must read complete instructions/literature along with all the possible adverse reactions/side effects for all the Medicines you take and that have been prescribed to you. Take any new Medicines after you have completely understood and accpet all the possible adverse reactions/side effects.   Do not drive when taking Pain medications or sleeping medications (Benzodaizepines)  Do not take more than prescribed Pain, Sleep and Anxiety Medications. It is not advisable to combine anxiety,sleep and pain medications without talking with your primary care practitioner  Special Instructions: If you have smoked or chewed Tobacco  in the last 2 yrs please stop smoking, stop any regular Alcohol  and or any Recreational drug use.  Wear Seat belts while driving.  Please note: You were cared for by a hospitalist during your hospital stay. Once you are discharged, your primary care physician will handle any further medical issues. Please note that NO REFILLS for any discharge medications will be authorized once you are discharged, as it is imperative that you return to your primary care physician (or establish a relationship with a primary care physician if you do not have one) for your post hospital discharge needs so that they can reassess your need for medications and monitor your lab values.   Increase activity slowly   Complete by:  As directed       Allergies as of 08/29/2023       Reactions   Scopolamine Rash        Medication List     TAKE these medications    (feeding supplement) PROSource Plus liquid Take 30 mLs by mouth 2 (two) times daily between meals.   feeding supplement Liqd Take 237 mLs by mouth 2 (two) times daily between meals.   acetaminophen 325 MG tablet Commonly known as: TYLENOL Take 1-2 tablets (325-650 mg total) by mouth every 4 (four) hours as needed for mild pain (pain score 1-3).   albuterol 108 (90 Base) MCG/ACT inhaler Commonly known as:  VENTOLIN HFA INHALE 2 PUFFS INTO THE LUNGS EVERY 6 HOURS AS NEEDED FOR WHEEZING OR SHORTNESS OF BREATH   ALPRAZolam 0.25 MG tablet Commonly known as: XANAX Take 1 tablet (0.25 mg total) by mouth 2 (two) times daily as needed for anxiety.   apixaban 5 MG Tabs tablet Commonly known as: ELIQUIS Take 1 tablet (5 mg total) by mouth 2 (two) times daily.   dexamethasone 1 MG tablet Commonly known as: DECADRON Take 1 tablet (1 mg total) by mouth daily.   hydrocortisone 2.5 % rectal cream Commonly known as: ANUSOL-HC use in rectal area 2 to 3 times a day as needed What changed:  how much to take how to take this when to take this reasons to take this additional instructions   levETIRAcetam 1000 MG tablet Commonly known as: KEPPRA Take 1 tablet (1,000 mg total) by mouth 2 (two) times daily.   LYSINE PO Take 2 tablets by mouth daily.   magnesium oxide 400 (240 Mg) MG tablet Commonly known as: MAG-OX Take 1 tablet (400 mg total) by mouth 2 (two) times daily.   meclizine 50 MG tablet Commonly known as: ANTIVERT Take 1 tablet (50 mg total) by mouth 3 (three) times daily.   metoprolol succinate 25 MG 24 hr tablet Commonly known as: TOPROL-XL Take 1 tablet (25 mg total) by mouth daily.   multivitamin with minerals Tabs tablet Take 1 tablet by mouth daily.   OLANZapine 2.5 MG tablet Commonly known as: ZYPREXA Take 1 tablet (2.5 mg total) by mouth at bedtime.   ondansetron 4 MG tablet Commonly known as: ZOFRAN TAKE 1 TABLET(4 MG) BY MOUTH EVERY 8 HOURS AS NEEDED FOR NAUSEA OR VOMITING What changed: See the new instructions.   OVER THE COUNTER MEDICATION Take 2 tablets by mouth daily. Beet root   oxyCODONE 5 MG immediate release tablet Commonly known as: Oxy IR/ROXICODONE Take 1 tablet (5 mg total) by mouth every 4 (four) hours as needed for severe pain (pain score 7-10).   pantoprazole 40 MG tablet Commonly known as: PROTONIX Take 1 tablet (40 mg total) by mouth  daily.   polyethylene glycol 17 g packet Commonly known as: MIRALAX / GLYCOLAX Take 17 g by mouth every evening.   potassium chloride SA 20 MEQ tablet Commonly known as: KLOR-CON M Take 1 tablet (20 mEq total) by mouth 2 (two) times daily.   senna-docusate 8.6-50 MG tablet Commonly known as: Senokot-S Take 2 tablets by mouth daily as needed (Patient preference, Sennakot S for mild constipation). What changed: reasons to take this   spironolactone 25 MG tablet Commonly known as: ALDACTONE Take 1 tablet (25 mg total) by mouth daily.   VITAMIN B12 PO Take 1 tablet by mouth daily. Gummies   VITAMIN D PO Take 1 capsule by mouth every evening.  Xtampza ER 13.5 MG C12a Generic drug: oxyCODONE ER Take 1 capsule by mouth in the morning and at bedtime. (every 12 hours)        Follow-up Information     Baxley, Luanna Cole, MD. Schedule an appointment as soon as possible for a visit in 1 week(s).   Specialty: Internal Medicine Contact information: [address] [place] [address] [phone]         Henreitta Leber, MD. Schedule an appointment as soon as possible for a visit in 1 week(s).   Specialties: Psychiatry, Neurology, Oncology Contact information: [address] [place] [address] [phone]         Jaci Standard, MD Follow up in 1 week(s).   Specialty: Hematology and Oncology Contact information: [address] [place] [address] [phone]                Allergies  Allergen Reactions   Scopolamine Rash     Other Procedures/Studies: VAS [name] LOWER EXTREMITY VENOUS (DVT) Result Date: 08/26/2023  Lower Venous DVT Study Patient Name:  DORACE SCINTA  Date of Exam:   08/26/2023 Medical Rec #: [id number]          Accession #:    [id number] Date of Birth: 15-Aug-1956         Patient Gender: F Patient Age:   60 years Exam Location[facility]  [facility] Procedure:      VAS [name] LOWER EXTREMITY VENOUS (DVT) Referring  Phys: Madelyn Flavors --------------------------------------------------------------------------------  Indications: Pulmonary embolism.  Risk Factors: Chemotherapy/metastatic cancer, hx of DVT. Limitations: Poor ultrasound/tissue interface. Comparison Study: Previous exam on 06/05/23 was positive for age indeterminate                   DVT in left PTV Performing Technologist: Ernestene Mention RVT, RDMS  Examination Guidelines: A complete evaluation includes B-mode imaging, spectral Doppler, color Doppler, and power Doppler as needed of all accessible portions of each vessel. Bilateral testing is considered an integral part of a complete examination. Limited examinations for reoccurring indications may be performed as noted. The reflux portion of the exam is performed with the patient in reverse Trendelenburg.  +---------+---------------+---------+-----------+----------+--------------+ RIGHT    CompressibilityPhasicitySpontaneityPropertiesThrombus Aging +---------+---------------+---------+-----------+----------+--------------+ CFV      Full           Yes      Yes                                 +---------+---------------+---------+-----------+----------+--------------+ SFJ      Full                                                        +---------+---------------+---------+-----------+----------+--------------+ FV Prox  Full           Yes      Yes                                 +---------+---------------+---------+-----------+----------+--------------+ FV Mid   Full           Yes      Yes                                 +---------+---------------+---------+-----------+----------+--------------+  FV DistalFull           Yes      Yes                                 +---------+---------------+---------+-----------+----------+--------------+ PFV      Full                                                         +---------+---------------+---------+-----------+----------+--------------+ POP      Full           Yes      Yes                                 +---------+---------------+---------+-----------+----------+--------------+ PTV      Full                                                        +---------+---------------+---------+-----------+----------+--------------+ PERO     Full                                                        +---------+---------------+---------+-----------+----------+--------------+   +---------+---------------+---------+-----------+----------+-------------------+ LEFT     CompressibilityPhasicitySpontaneityPropertiesThrombus Aging      +---------+---------------+---------+-----------+----------+-------------------+ CFV      Full           Yes      Yes                                      +---------+---------------+---------+-----------+----------+-------------------+ SFJ      Full                                                             +---------+---------------+---------+-----------+----------+-------------------+ FV Prox  Full           Yes      Yes                                      +---------+---------------+---------+-----------+----------+-------------------+ FV Mid   Full           Yes      Yes                                      +---------+---------------+---------+-----------+----------+-------------------+ FV DistalFull           Yes      Yes                                      +---------+---------------+---------+-----------+----------+-------------------+  PFV      Full                                                             +---------+---------------+---------+-----------+----------+-------------------+ POP      Full           Yes      Yes                                      +---------+---------------+---------+-----------+----------+-------------------+ PTV      Full                                                              +---------+---------------+---------+-----------+----------+-------------------+ PERO     Full                                         Not well visualized +---------+---------------+---------+-----------+----------+-------------------+     Summary: BILATERAL: - No evidence of deep vein thrombosis seen in the lower extremities, bilaterally. -No evidence of popliteal cyst, bilaterally.   *See table(s) above for measurements and observations. Electronically signed by Gerarda Fraction on 08/26/2023 at 6:45:03 PM.    Final    CT Angio Chest PE W/Cm &/Or Wo Cm Result Date: 08/26/2023 CLINICAL DATA:  Pulmonary embolism, chest pain EXAM: CT ANGIOGRAPHY CHEST WITH CONTRAST TECHNIQUE: Multidetector CT imaging of the chest was performed using the standard protocol during bolus administration of intravenous contrast. Multiplanar CT image reconstructions and MIPs were obtained to evaluate the vascular anatomy. RADIATION DOSE REDUCTION: This exam was performed according to the departmental dose-optimization program which includes automated exposure control, adjustment of the mA and/or kV according to patient size and/or use of iterative reconstruction technique. CONTRAST:  50mL OMNIPAQUE IOHEXOL 350 MG/ML SOLN COMPARISON:  08/23/2023 FINDINGS: Cardiovascular: There is adequate opacification of the pulmonary arterial tree. Several small intraluminal filling defects are identified within the basal segmental pulmonary arteries of the right lower lobe in keeping with acute pulmonary emboli. The embolic burden is small. The central pulmonary arteries are of normal caliber. There is mild global cardiomegaly with left ventricular enlargement. No CT evidence of right heart strain. Mild coronary artery calcification. Small pericardial effusion has increased in size since prior examination. Moderate atherosclerotic calcification within the thoracic aorta. No aortic aneurysm.  Mediastinum/Nodes: No enlarged mediastinal, hilar, or axillary lymph nodes. Thyroid gland, trachea, and esophagus demonstrate no significant findings. Lungs/Pleura: Bibasilar atelectasis with lateral segmental collapse of the right lower lobe. Stable post treatment changes within the medial a right lower lobe abutting the pleural surface and extending into the azygoesophageal recess. A trace right pleural effusion. No confluent pulmonary infiltrate. No pneumothorax. No central obstructing lesion. Upper Abdomen: No acute abnormality. Musculoskeletal: T8 vertebroplasty. No acute bone abnormality. Mixed lytic and sclerotic lesion within the mid body of the sternum suspicious for a solitary osseous metastasis. This appears new since more remote prior examination of 06/03/2023 Review of the MIP  images confirms the above findings. IMPRESSION: 1. Acute pulmonary emboli within the basal segmental pulmonary arteries of the right lower lobe. The embolic burden is small. No CT evidence of right heart strain. 2. Trace right pleural effusion, possibly reactive in nature. 3. Mild global cardiomegaly with left ventricular enlargement. Mild coronary artery calcification. 4. Small pericardial effusion, increased in size since prior examination. 5. Stable post treatment changes within the medial right lower lobe. 6. Mixed lytic and sclerotic lesion within the mid body of the sternum suspicious for a solitary osseous metastasis. This appears new since more remote prior examination of 06/03/2023. PET CT examination may be helpful for further evaluation. Aortic Atherosclerosis (ICD10-I70.0). Electronically Signed   By: Helyn Numbers M.D.   On: 08/26/2023 03:55   DG Chest Port 1 View Result Date: 08/26/2023 CLINICAL DATA:  Chest pain EXAM: PORTABLE CHEST 1 VIEW COMPARISON:  Radiographs 06/28/2023 and CT 08/23/2023 FINDINGS: Stable cardiomegaly. Aortic atherosclerotic calcification. Bibasilar atelectasis/scarring. Otherwise no focal  consolidation. No pleural effusion or pneumothorax. Vertebroplasty of a midthoracic vertebral body. No displaced rib fractures. IMPRESSION: No active disease. Cardiomegaly. Electronically Signed   By: Minerva Fester M.D.   On: 08/26/2023 01:26   CT CHEST ABDOMEN PELVIS W CONTRAST Result Date: 08/24/2023 CLINICAL DATA:  Non-small cell lung cancer restaging * Tracking Code: BO * EXAM: CT CHEST, ABDOMEN, AND PELVIS WITH CONTRAST TECHNIQUE: Multidetector CT imaging of the chest, abdomen and pelvis was performed following the standard protocol during bolus administration of intravenous contrast. RADIATION DOSE REDUCTION: This exam was performed according to the departmental dose-optimization program which includes automated exposure control, adjustment of the mA and/or kV according to patient size and/or use of iterative reconstruction technique. CONTRAST:  OMNIPAQUE IOHEXOL 300 MG/ML  SOLN COMPARISON:  06/03/2023, 05/23/2023 FINDINGS: CT CHEST FINDINGS Cardiovascular: Aortic atherosclerosis. Cardiomegaly. Scattered left coronary artery calcifications. Small pericardial effusion, unchanged. Mediastinum/Nodes: Unchanged, matted post treatment appearance of soft tissue throughout the mediastinum (series 2, image 27). No discretely enlarged mediastinal, hilar, or axillary lymph nodes. Thyroid gland, trachea, and esophagus demonstrate no significant findings. Lungs/Pleura: Nearly complete resolution of previously seen extensive acute airspace disease and consolidation throughout of the lungs. Bland appearing, bandlike scarring of the bilateral lung bases with scattered small nodular opacities in the apices, for example an irregular opacity in the posterior right apex measuring 1.0 cm (series 4, image 42). Slight interval increase in size of a spiculated nodule in the infrahilar right lower lobe measuring 1.1 x 0.7 cm, previously 0.9 x 0.4 cm (series 4, image 65). Otherwise unchanged post treatment appearance of  the perihilar and infrahilar right lung. Musculoskeletal: No chest wall abnormality. No acute osseous findings. CT ABDOMEN PELVIS FINDINGS Hepatobiliary: No focal liver abnormality is seen. Status post cholecystectomy. Unchanged mild postoperative biliary dilatation. Pancreas: Unremarkable. No pancreatic ductal dilatation or surrounding inflammatory changes. Spleen: Normal in size without significant abnormality. Adrenals/Urinary Tract: Adrenal glands are unremarkable. Kidneys are normal, without renal calculi, solid lesion, or hydronephrosis. Bladder is unremarkable. Stomach/Bowel: Stomach is within normal limits. Appendix appears normal. No evidence of bowel wall thickening, distention, or inflammatory changes. Sigmoid diverticulosis. Vascular/Lymphatic: Aortic atherosclerosis. No enlarged abdominal or pelvic lymph nodes. Reproductive: Status post hysterectomy. Other: No abdominal wall hernia or abnormality. No ascites. Musculoskeletal: No acute osseous findings. Subtle sclerosis of a nondisplaced fracture of the mid sternal body seen acutely on examination dated 06/03/2023 (series 6, image 112). Multiple subacute, partially callused anterior rib fractures. Unchanged sclerotic wedge deformity of T8 status post vertebral cement  augmentation. Additional areas of ill-defined sclerosis unchanged, for example in the superior anterior aspect of L2 (series 6, image 110). IMPRESSION: 1. Nearly complete resolution of previously seen extensive acute airspace disease and consolidation throughout of the lungs. 2. Slight interval increase in size of a spiculated nodule in the infrahilar right lower lobe, concerning for local recurrence. This could be assessed for abnormal metabolic activity by PET-CT if desired, otherwise attention on follow-up. 3. Otherwise unchanged post treatment appearance of the perihilar and infrahilar right lung as well as matted soft tissue throughout the mediastinum. No discretely enlarged lymph  nodes. 4. Multiple small unchanged solid and ground-glass pulmonary nodules, largest an irregular nodule in the posterior right apex measuring 1.0 cm. These are nonspecific. Continued attention on follow-up. 5. Subtle sclerosis of a nondisplaced fracture of the mid sternal body as well as multiple partially callused anterior rib fractures, seen acutely on examination dated 06/03/2023 following CPR. 6. Unchanged sclerotic wedge deformity of T8 status post vertebral cement augmentation. Additional areas of ill-defined sclerosis unchanged, nonspecific and possibly reflecting treated osseous metastatic disease. 7. Cardiomegaly and coronary artery disease. Aortic Atherosclerosis (ICD10-I70.0). Electronically Signed   By: Jearld Lesch M.D.   On: 08/24/2023 14:27   MR BRAIN W WO CONTRAST Result Date: 08/19/2023 CLINICAL DATA:  Brain/CNS neoplasm, assess treatment response. Non-small cell lung cancer. EXAM: MRI HEAD WITHOUT AND WITH CONTRAST TECHNIQUE: Multiplanar, multiecho pulse sequences of the brain and surrounding structures were obtained without and with intravenous contrast. COMPARISON:  MRI of the brain May 21, 2023. FINDINGS: Brain: Multiple enhancing lesions in the bilateral cerebellar hemisphere, the largest in the left cerebellar hemisphere with necrotic center with roughly unchanged size and contrast enhancement when compared to prior MRI. However, T2 hyperintensity involving most of the cerebellar hemispheres the, vermis and middle cerebellar peduncles as well as right side of the medulla have progressed. 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 There is also area of contrast enhancement on the left side protruding through the area of craniotomy, unchanged. 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 Some of the lesions are associated with significant surrounding vasogenic edema, for example anterior left frontal lobe lesion, measuring approximately 11 x 7 mm compared to 9 x 6 mm on prior, 2 posterior left frontal lesions measuring approximately 5 mm and 4 mm compared to 4 mm and 3 mm on prior and right parietal lobe lesion measuring 16 x 11 mm compared to 12 x 11 mm on prior. Three punctate foci of restricted diffusion are seen in the bilateral cerebellar hemispheres (series 3, images 48 and 52). Scattered and confluent T2 hyperintensity within the elsewhere in the white matter of the cerebral hemispheres could be related to microangiopathy and/or posttreatment changes, progressed. Vascular: Normal flow voids. Skull and upper cervical spine: Postsurgical changes from left suboccipital craniotomy. Sinuses/Orbits: Small left maxillary sinus mucous retention cyst and opacification of a posterior left ethmoid cells are unchanged. Left mastoid effusion. The orbits are maintained. Other: None. IMPRESSION: 1. Evidence of disease progression with increased size of most of the previously seen enhancing lesions in the supratentorial compartment with mildly progressed vasogenic edema surrounding the largest lesions. 2. Stable size of the infratentorial lesions with progression of the associated T2 hyperintensity involving nearly the entire cerebellum, extending into the  middle cerebellar peduncles and right side of the medulla. 3. 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 Electronically Signed   By: Baldemar Lenis M.D.   On: 08/19/2023 15:45     TODAY-DAY OF DISCHARGE:  Subjective:   [place] today has no headache,no chest abdominal pain,no new weakness tingling or numbness, feels much better wants to go home  today.   Objective:   Blood pressure 103/65, pulse 78, temperature 97.6 F (36.4 C), temperature source Axillary, resp. rate 15, height 5\' 2"  (1.575 m), weight 71.9 kg, SpO2 90%. No intake or output data in the 24 hours ending 08/29/23 0827 Filed Weights   08/26/23 0400 08/26/23 1845  Weight: 70.7 kg 71.9 kg    Exam: Awake Alert, Oriented *3, No new F.N deficits, Normal affect [place].AT,PERRAL Supple Neck,No JVD, No cervical lymphadenopathy appriciated.  Symmetrical Chest wall movement, Good air movement bilaterally, CTAB RRR,No Gallops,Rubs or new Murmurs, No Parasternal Heave +ve B.Sounds, Abd Soft, Non tender, No organomegaly appriciated, No rebound -guarding or rigidity. No Cyanosis, Clubbing or edema, No new Rash or bruise   PERTINENT RADIOLOGIC STUDIES: No results found.   PERTINENT LAB RESULTS: CBC: Recent Labs    08/27/23 0120 08/28/23 0443  WBC 16.9* 12.6*  HGB 10.8* 9.3*  HCT 32.1* 27.8*  PLT 261 304   CMET CMP     Component Value Date/Time   NA 133 (L) 08/28/2023 0443   K 3.6 08/28/2023 0443   CL 97 (L) 08/28/2023 0443   CO2 26 08/28/2023 0443   GLUCOSE 129 (H) 08/28/2023 0443   BUN 31 (H) 08/28/2023 0443   CREATININE 1.34 (H) 08/28/2023 0443   CREATININE 1.32 (H) 08/12/2023 1110   CREATININE 0.58 11/15/2017 0948   CALCIUM 7.7 (L) 08/28/2023 0443   PROT 6.5 08/26/2023 0053   ALBUMIN 2.6 (L) 08/26/2023 0053   AST 35 08/26/2023 0053   AST 16 08/12/2023 1110   ALT 41 08/26/2023 0053   ALT 15 08/12/2023 1110   ALKPHOS 47 08/26/2023 0053   BILITOT 0.3 08/26/2023 0053   BILITOT 0.2 08/12/2023 1110   GFRNONAA 44 (L) 08/28/2023 0443   GFRNONAA 45 (L) 08/12/2023 1110   GFRNONAA 100 11/15/2017 0948    GFR Estimated Creatinine Clearance: 38.3 mL/min (A) (by C-G formula based on SCr of 1.34 mg/dL (H)). No results for input(s): "LIPASE", "AMYLASE" in the last 72 hours. No results for input(s): "CKTOTAL", "CKMB", "CKMBINDEX", "TROPONINI" in the last 72  hours. Invalid input(s): "POCBNP" No results for input(s): "DDIMER" in the last 72 hours. No results for input(s): "HGBA1C" in the last 72 hours. No results for input(s): "CHOL", "HDL", "LDLCALC", "TRIG", "CHOLHDL", "LDLDIRECT" in the last 72 hours. No results for input(s): "TSH", "T4TOTAL", "T3FREE", "THYROIDAB" in the last 72 hours.  Invalid input(s): "FREET3" No results for input(s): "VITAMINB12", "FOLATE", "FERRITIN", "TIBC", "IRON", "RETICCTPCT" in the last 72 hours. Coags: No results for input(s): "INR" in the last 72 hours.  Invalid input(s): "PT" Microbiology: No results found for this or any previous visit (from the past 240 hours).  FURTHER DISCHARGE INSTRUCTIONS:  Get Medicines reviewed and adjusted: Please take all your medications with you for your next visit with your Primary MD  Laboratory/radiological data: Please request your Primary MD to go over all hospital tests and procedure/radiological results at the follow up, please ask your Primary MD to get all Hospital records sent to his/her office.  In some cases, they will  be blood work, cultures and biopsy results pending at the time of your discharge. Please request that your primary care M.D. goes through all the records of your hospital data and follows up on these results.  Also Note the following: If you experience worsening of your admission symptoms, develop shortness of breath, life threatening emergency, suicidal or homicidal thoughts you must seek medical attention immediately by calling 911 or calling your MD immediately  if symptoms less severe.  You must read complete instructions/literature along with all the possible adverse reactions/side effects for all the Medicines you take and that have been prescribed to you. Take any new Medicines after you have completely understood and accpet all the possible adverse reactions/side effects.   Do not drive when taking Pain medications or sleeping medications  (Benzodaizepines)  Do not take more than prescribed Pain, Sleep and Anxiety Medications. It is not advisable to combine anxiety,sleep and pain medications without talking with your primary care practitioner  Special Instructions: If you have smoked or chewed Tobacco  in the last 2 yrs please stop smoking, stop any regular Alcohol  and or any Recreational drug use.  Wear Seat belts while driving.  Please note: You were cared for by a hospitalist during your hospital stay. Once you are discharged, your primary care physician will handle any further medical issues. Please note that NO REFILLS for any discharge medications will be authorized once you are discharged, as it is imperative that you return to your primary care physician (or establish a relationship with a primary care physician if you do not have one) for your post hospital discharge needs so that they can reassess your need for medications and monitor your lab values.  Total Time spent coordinating discharge including counseling, education and face to face time equals greater than 30 minutes.  SignedJeoffrey Massed 08/29/2023 8:27 AM

## 2023-08-29 NOTE — Progress Notes (Signed)
Patient and family given discharge paperwork  all questions answered.

## 2023-08-30 ENCOUNTER — Telehealth: Payer: Self-pay

## 2023-08-30 NOTE — Telephone Encounter (Signed)
Transition Care Management Follow-up Telephone Call Date of discharge and from where: [facility] 08/29/23 How have you been since you were released from the hospital? Okay, rested all night and slept until 2pm today.  Any questions or concerns? No   Items Reviewed: Did the pt receive and understand the discharge instructions provided?Yes  Medications obtained and verified? Yes  Other?  Any new allergies since your discharge? No  Dietary orders reviewed? Yes  Do you have support at home? Yes,    Home Care and Equipment/Supplies: Were home health services ordered? PT, foot and hand movements If so, what is the name of the agency?   [facility] Has the agency set up a time to come to the patient's home? yes  Were any new equipment or medical supplies ordered? no  What is the name of the medical supply agency? n/a   Were you able to get the supplies/equipment? n/a Do you have any questions related to the use of the equipment or supplies? n/a   Functional Questionnaire: (I = Independent and D = Dependent)  ADLs: I   Bathing/Dressing- I   Meal Prep- D   Eating-  D  Maintaining continence- D    Transferring/Ambulation- I    Managing Meds- D    Follow up appointments reviewed:   PCP Hospital f/u appt confirmed? Yes, 09/06/23 video visit at 12:00pm  Specialist Hospital f/u appt confirmed? Yes  Are transportation arrangements needed? No  If their condition worsens, is the pt aware to call PCP or go to the Emergency Dept.? Yes  Was the patient provided with contact information for the PCP's office or ED? Yes  Was to pt encouraged to call back with questions or concerns? Yes

## 2023-09-02 ENCOUNTER — Inpatient Hospital Stay: Payer: Medicare Other

## 2023-09-02 ENCOUNTER — Other Ambulatory Visit: Payer: Self-pay

## 2023-09-02 ENCOUNTER — Inpatient Hospital Stay (HOSPITAL_BASED_OUTPATIENT_CLINIC_OR_DEPARTMENT_OTHER): Payer: Medicare Other | Admitting: Hematology and Oncology

## 2023-09-02 ENCOUNTER — Other Ambulatory Visit: Payer: Self-pay | Admitting: Internal Medicine

## 2023-09-02 ENCOUNTER — Telehealth: Payer: Self-pay | Admitting: Internal Medicine

## 2023-09-02 VITALS — BP 126/98 | HR 78 | Temp 98.0°F | Resp 15 | Wt 149.9 lb

## 2023-09-02 DIAGNOSIS — C3490 Malignant neoplasm of unspecified part of unspecified bronchus or lung: Secondary | ICD-10-CM

## 2023-09-02 DIAGNOSIS — C7931 Secondary malignant neoplasm of brain: Secondary | ICD-10-CM

## 2023-09-02 DIAGNOSIS — Z5112 Encounter for antineoplastic immunotherapy: Secondary | ICD-10-CM | POA: Diagnosis present

## 2023-09-02 DIAGNOSIS — R918 Other nonspecific abnormal finding of lung field: Secondary | ICD-10-CM

## 2023-09-02 DIAGNOSIS — Z79620 Long term (current) use of immunosuppressive biologic: Secondary | ICD-10-CM | POA: Diagnosis not present

## 2023-09-02 DIAGNOSIS — C7951 Secondary malignant neoplasm of bone: Secondary | ICD-10-CM | POA: Diagnosis not present

## 2023-09-02 DIAGNOSIS — C3431 Malignant neoplasm of lower lobe, right bronchus or lung: Secondary | ICD-10-CM | POA: Diagnosis present

## 2023-09-02 DIAGNOSIS — Z79631 Long term (current) use of antimetabolite agent: Secondary | ICD-10-CM | POA: Diagnosis not present

## 2023-09-02 LAB — CBC WITH DIFFERENTIAL (CANCER CENTER ONLY)
Abs Immature Granulocytes: 0.95 10*3/uL — ABNORMAL HIGH (ref 0.00–0.07)
Basophils Absolute: 0.1 10*3/uL (ref 0.0–0.1)
Basophils Relative: 1 %
Eosinophils Absolute: 0 10*3/uL (ref 0.0–0.5)
Eosinophils Relative: 0 %
HCT: 34 % — ABNORMAL LOW (ref 36.0–46.0)
Hemoglobin: 11.2 g/dL — ABNORMAL LOW (ref 12.0–15.0)
Immature Granulocytes: 8 %
Lymphocytes Relative: 20 %
Lymphs Abs: 2.5 10*3/uL (ref 0.7–4.0)
MCH: 32.5 pg (ref 26.0–34.0)
MCHC: 32.9 g/dL (ref 30.0–36.0)
MCV: 98.6 fL (ref 80.0–100.0)
Monocytes Absolute: 1.4 10*3/uL — ABNORMAL HIGH (ref 0.1–1.0)
Monocytes Relative: 11 %
Neutro Abs: 7.8 10*3/uL — ABNORMAL HIGH (ref 1.7–7.7)
Neutrophils Relative %: 60 %
Platelet Count: 579 10*3/uL — ABNORMAL HIGH (ref 150–400)
RBC: 3.45 MIL/uL — ABNORMAL LOW (ref 3.87–5.11)
RDW: 12.2 % (ref 11.5–15.5)
Smear Review: NORMAL
WBC Count: 12.7 10*3/uL — ABNORMAL HIGH (ref 4.0–10.5)
nRBC: 0 % (ref 0.0–0.2)

## 2023-09-02 LAB — CMP (CANCER CENTER ONLY)
ALT: 12 U/L (ref 0–44)
AST: 12 U/L — ABNORMAL LOW (ref 15–41)
Albumin: 3.3 g/dL — ABNORMAL LOW (ref 3.5–5.0)
Alkaline Phosphatase: 50 U/L (ref 38–126)
Anion gap: 6 (ref 5–15)
BUN: 50 mg/dL — ABNORMAL HIGH (ref 8–23)
CO2: 30 mmol/L (ref 22–32)
Calcium: 10.6 mg/dL — ABNORMAL HIGH (ref 8.9–10.3)
Chloride: 105 mmol/L (ref 98–111)
Creatinine: 1.24 mg/dL — ABNORMAL HIGH (ref 0.44–1.00)
GFR, Estimated: 48 mL/min — ABNORMAL LOW (ref >60)
Glucose, Bld: 108 mg/dL — ABNORMAL HIGH (ref 70–99)
Potassium: 5.1 mmol/L (ref 3.5–5.1)
Sodium: 141 mmol/L (ref 135–145)
Total Bilirubin: 0.2 mg/dL (ref <1.2)
Total Protein: 7.1 g/dL (ref 6.5–8.1)

## 2023-09-02 LAB — TSH: TSH: 1.105 u[IU]/mL (ref 0.350–4.500)

## 2023-09-02 MED ORDER — SODIUM CHLORIDE 0.9 % IV SOLN
200.0000 mg | Freq: Once | INTRAVENOUS | Status: AC
  Administered 2023-09-02: 200 mg via INTRAVENOUS
  Filled 2023-09-02: qty 200

## 2023-09-02 MED ORDER — SODIUM CHLORIDE 0.9 % IV SOLN: Freq: Once | INTRAVENOUS | Status: AC

## 2023-09-02 NOTE — Telephone Encounter (Signed)
-------  Fax Transmission Report-------  To:               Recipient at [phone] Subject:          Fw: Hp Scans Result:           The transmission was successful. Explanation:      All Pages Ok Pages Sent:       3 Connect Time:     1 minutes, 28 seconds Transmit Time:    09/02/2023 09:59 Transfer Rate:    14400 Status Code:      0000 Retry Count:      0 Job Id:           [id number] Unique Id:        [id number] Fax Line:         54 Fax Server:       [employer]

## 2023-09-02 NOTE — Patient Instructions (Signed)
 [facility] - A DEPT OF [facility]  Discharge Instructions: Thank you for choosing [facility] to provide your oncology and hematology care.   If you have a lab appointment with the Cancer Center, please go directly to the Cancer Center and check in at the registration area.   Wear comfortable clothing and clothing appropriate for easy access to any Portacath or PICC line.   We strive to give you quality time with your provider. You may need to reschedule your appointment if you arrive late (15 or more minutes).  Arriving late affects you and other patients whose appointments are after yours.  Also, if you miss three or more appointments without notifying the office, you may be dismissed from the clinic at the provider's discretion.      For prescription refill requests, have your pharmacy contact our office and allow 72 hours for refills to be completed.    Today you received the following chemotherapy and/or immunotherapy agents: Keytruda.       To help prevent nausea and vomiting after your treatment, we encourage you to take your nausea medication as directed.  BELOW ARE SYMPTOMS THAT SHOULD BE REPORTED IMMEDIATELY: *FEVER GREATER THAN 100.4 F (38 C) OR HIGHER *CHILLS OR SWEATING *NAUSEA AND VOMITING THAT IS NOT CONTROLLED WITH YOUR NAUSEA MEDICATION *UNUSUAL SHORTNESS OF BREATH *UNUSUAL BRUISING OR BLEEDING *URINARY PROBLEMS (pain or burning when urinating, or frequent urination) *BOWEL PROBLEMS (unusual diarrhea, constipation, pain near the anus) TENDERNESS IN MOUTH AND THROAT WITH OR WITHOUT PRESENCE OF ULCERS (sore throat, sores in mouth, or a toothache) UNUSUAL RASH, SWELLING OR PAIN  UNUSUAL VAGINAL DISCHARGE OR ITCHING   Items with * indicate a potential emergency and should be followed up as soon as possible or go to the Emergency Department if any problems should occur.  Please show the CHEMOTHERAPY ALERT CARD or IMMUNOTHERAPY  ALERT CARD at check-in to the Emergency Department and triage nurse.  Should you have questions after your visit or need to cancel or reschedule your appointment, please contact [facility] - A DE[facility]  Dept: [phone]  and follow the prompts.  Office hours are 8:00 a.m. to 4:30 p.m. Monday - Friday. Please note that voicemails left after 4:00 p.m. may not be returned until the following business day.  We are closed weekends and major holidays. You have access to a nurse at all times for urgent questions. Please call the main number to the clinic Dept: [phone] and follow the prompts.   For any non-urgent questions, you may also contact your provider using MyChart. We now offer e-Visits for anyone 24 and older to request care online for non-urgent symptoms. For details visit [email or web address].   Also download the MyChart app! Go to the app store, search "MyChart", open the app, select [facility], and log in with your MyChart username and password.

## 2023-09-02 NOTE — Telephone Encounter (Signed)
Received fax orders from [facility] for PT to increase due to recent hospitalization. Note dated 09/01/2023

## 2023-09-02 NOTE — Progress Notes (Signed)
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 CT of the head showed showed concerning for metastatic disease involving the left cerebellum and right occipital lobe.  2) 09/30/2019: MRI brain confirms mass within the left cerebellum measures 1.6 x 1.3 x 1.5 cm as well as at least 8 metastatic lesions within the supratentorial and infratentorial brain as outlined 3) 10/01/2019: PET CT scan revealed hypermetabolic infrahilar right lower lobe mass with hypermetabolic nodal metastases in the right hilum, mediastinum, right supraclavicular region, left axilla and lower left neck. 4) 10/08/2019: US guided biopsy of the lymph nodes confirms poorly differentiated non-small cell carcinoma. Immunohistochemistry confirms adenocarcinoma of lung primary. PD-L1 and NGS later revealed a MET Exon 14 mutation and TPS of 90%.  5) 10/12/2019: Establish care with Dr. [name]  6) 11/04/2019: Day 1 of capmatinib 400mg  BID 7) 11/09/2019: presented to Rad/Onc with a DVT in LLE. Seen in symptom management clinic and started on apixaban.  8) 12/29/2019: CT C/A/P showed interval decrease in size of mass involving the superior segment of right lower lobe and reduction in mediastinal and left supraclavicular adenopathy though some small spinal lesions were noted in the interim between the two sets of scans 9) 01/25/2020: temporarily stopped capmatinib due to symptoms of nausea/fatigue.  Restarted therapy on 01/30/2020 after brief chemo holiday.  10) 03/22/2020: CT C/A/P reveals a mixed picture, with new lung nodule and increased lymphadenopathy, however response in bone lesions. D/c capmatinib therapy 11) 04/08/2020: start of Carbo/Pem/Pem for 2nd line treatment. Cycle 1 Day 1   12) 04/29/2020: Cycle 2 Day 1 Carbo/Pem/Pem  13) 05/20/2020: Cycle 3 Day 1 Carbo/Pem/Pem  14) 06/09/2020: Cycle 4 Day 1 Carbo/Pem/Pem  15) 06/29/2020: Cycle 5 Day 1 maintenance Pem/Pem  16) 07/20/2020: Cycle 6 Day 1 maintenance Pem/Pem  17) 08/12/2020: Cycle 7 Day 1 maintenance Pem/Pem  18) 09/01/2020: Cycle 8 Day 1 maintenance Pem/Pem  19) 09/21/2020: Cycle 9 Day 1 maintenance Pem/Pem  20) 10/13/2020: Cycle 10 Day 1 maintenance Pem/Pem  21) 11/03/2020: Cycle 11 Day 1 maintenance Pem/Pem  22) 11/25/2020:  Cycle 12 Day 1 maintenance Pem/Pem 23) 12/15/2020:  Cycle 13 Day 1 maintenance Pem/Pem 24) 01/05/2021:  Cycle 14 Day 1 maintenance Pem/Pem 25) 01/26/2021:  Cycle 15 Day 1 maintenance Pem/Pem plus Bevacizumab.  02/17/2021: Cycle 16 Day 1 maintenance Pem/Pem plus Bevacizumab.  03/10/2021: Cycle 17 Day 1 maintenance Pem/Pem plus Bevacizumab.  03/31/2021: Cycle 18 Day 1 maintenance Pem/Pem  04/20/2021:  Cycle 19 Day 1 maintenance Pem/Pem  05/12/2021: Cycle 20 Day 1 maintenance Pem/Pem 06/01/2021: Cycle 21 Day 1 maintenance Pem/Pem 06/22/2021: Cycle 22 Day 1 maintenance Pem/Pem 07/13/2021: Cycle 23 Day 1 maintenance Pem/Pem 08/16/2021: Cycle 24 Day 1 maintenance Pem/Pem 09/08/2021: Cycle 25 Day 1 maintenance Pem/Pem plus Bevacizumab 09/28/2021: Cycle 26 Day 1 maintenance Pem/Pem plus Bevacizumab 10/20/2021: Cycle 27 Day 1 maintenance Pem/Pem plus Bevacizumab 11/09/2021: Cycle 28 Day 1 maintenance Pem/Pem  12/06/2021: Cycle 29 Day 1 maintenance Pem/Pem  12/27/2021: Cycle 30 Day 1 maintenance Pem/Pem  01/18/2022: Cycle 31 Day 1  maintenance Pem/Pem  02/07/2022: Cycle 32 Day 1 maintenance Pem/Pem  02/28/2022: Cycle 33 Day 1  maintenance Pem/Pem  03/23/2022: Cycle 34 Day 1 maintenance Pem/Pem  04/13/2022: Cycle 35 Day 1 maintenance Pem/Pem  05/04/2022: Cycle 36 Day 1 maintenance Pem/Pem  05/25/2022: Cycle 37 Day 1 maintenance Pem/Pem  06/14/2022: Cycle 38 Day 1 maintenance Pem/Pem 07/05/2022: Cycle 39 Day 1 maintenance Pem/Pem 08/13/2022: Cycle 40 Day 1 maintenance Pem/Pem 09/06/2022:Cycle 41 Day 1 maintenance Pem/Pem 09/27/2022: Cycle 42 Day 1 maintenance Pem/Pem 10/19/2022: Cycle 43 Day 1 maintenance Pem/Pem 11/09/2022: Cycle 44 Day 1 maintenance Pem/Pem 11/30/2022: Cycle 45 Day 1 maintenance Pem/Pem 12/20/2022: Cycle 46 Day 1 maintenance Pem/Pem 01/10/2023: Cycle 47 Day 1 maintenance Pem/Pem 01/31/2023:Cycle 48 Day 1 maintenance Pem/Pem 02/22/2023: Cycle 49 Day 1 maintenance Pem/Pem 03/15/2023: Cycle 50 Day 1 maintenance Pem/Pem 04/04/2023: Cycle 51 Day 1 maintenance Pem/Pem 04/26/2023: Cycle 52 Day 1 maintenance Pem/Pem 05/17/2023: Cycle 53 Day 1 maintenance Pem/Pem  Interval History:  LEVADA NEMECEK 67 y.o. female with medical history significant for metastatic adenocarcinoma of the lung presents for a follow up visit. The patient's last visit was on 08/12/2023.    On exam today Mrs. Conwell reports she has been well overall in the interim since her last visit.  She reports she tolerated chemotherapy and immunotherapy well during her last treatment.  She reports that she tends to feel off after the second day posttreatment for about a week.  She reports that she is tolerating her Eliquis therapy well and is not having as much pain or discomfort from her blood clot.  She reports that she has not been doing a lot of movement though she did get up at 1 point to walk to the bathroom with her walker.  She reports that she is not having any infectious symptoms such as runny nose, sore throat, cough.  She notes she is eating well and has been losing some weight since her discharge from the hospital.  She is at 149 pounds down from 155  pounds (there may have been a component of fluid).  She reports that she is taken the oxycodone a little more than usual, up to 3-4 times per day.  She denies any fevers, chills, sweats or chest pain. She has no other complaints.  Full 10 point ROS was otherwise negative  MEDICAL HISTORY:  Past Medical History:  Diagnosis Date   Anemia    Anxiety    Concussion 09/28/2019   DVT (deep venous thrombosis) (HCC) 2021   L leg   Dyspnea    GERD (gastroesophageal reflux disease)    Hypercholesterolemia    per pt, she does not have elevated lipids   Hypertension    met lung ca dx'd 09/2019   mets to spine, hip and brain   PONV (postoperative nausea and vomiting)    Tobacco abuse     SURGICAL HISTORY: Past Surgical History:  Procedure Laterality Date   ABDOMINAL HYSTERECTOMY     partial/ left ovaries   APPLICATION OF CRANIAL NAVIGATION N/A 12/02/2020   Procedure: APPLICATION OF CRANIAL NAVIGATION;  Surgeon: Maeola Harman, MD;  Location: [facility] OR;  Service: Neurosurgery;  Laterality: N/A;   CHOLECYSTECTOMY     CRANIOTOMY N/A 12/02/2020   Procedure: Posterior fossa craniotomy for tumor resection with brainlab;  Surgeon: Maeola Harman, MD;  Location: [facility] OR;  Service: Neurosurgery;  Laterality: N/A;   DILATION AND CURETTAGE OF UTERUS     IR IMAGING GUIDED PORT INSERTION  10/23/2019   IR  PATIENT EVAL TECH 0-60 MINS  11/21/2021   IR PATIENT EVAL TECH 0-60 MINS  11/24/2021   IR PATIENT EVAL TECH 0-60 MINS  11/29/2021   IR PATIENT EVAL TECH 0-60 MINS  12/04/2021   IR PATIENT EVAL TECH 0-60 MINS  12/12/2021   IR PATIENT EVAL TECH 0-60 MINS  12/19/2021   IR PATIENT EVAL TECH 0-60 MINS  12/27/2021   IR PATIENT EVAL TECH 0-60 MINS  01/03/2022   IR PATIENT EVAL TECH 0-60 MINS  01/10/2022   IR PATIENT EVAL TECH 0-60 MINS  01/18/2022   IR PATIENT EVAL TECH 0-60 MINS  02/13/2022   IR PATIENT EVAL TECH 0-60 MINS  02/20/2022   IR RADIOLOGIST EVAL & MGMT  01/23/2022   IR REMOVAL TUN ACCESS W/ PORT W/O FL MOD SED   11/17/2021   KYPHOPLASTY N/A 03/15/2020   Procedure: Thoracic Eight KYPHOPLASTY;  Surgeon: Maeola Harman, MD;  Location: [facility] OR;  Service: Neurosurgery;  Laterality: N/A;  prone    LIPOMA EXCISION  2018   removed under left breast and right thigh.   TUBAL LIGATION      ALLERGIES:  is allergic to scopolamine.  MEDICATIONS:  Current Outpatient Medications  Medication Sig Dispense Refill   acetaminophen (TYLENOL) 325 MG tablet Take 1-2 tablets (325-650 mg total) by mouth every 4 (four) hours as needed for mild pain (pain score 1-3).     albuterol (VENTOLIN HFA) 108 (90 Base) MCG/ACT inhaler INHALE 2 PUFFS INTO THE LUNGS EVERY 6 HOURS AS NEEDED FOR WHEEZING OR SHORTNESS OF BREATH (Patient taking differently: Inhale 2 puffs into the lungs every 6 (six) hours as needed for wheezing or shortness of breath.) 6.7 g 11   ALPRAZolam (XANAX) 0.25 MG tablet Take 1 tablet (0.25 mg total) by mouth 2 (two) times daily as needed for anxiety. 30 tablet 0   apixaban (ELIQUIS) 5 MG TABS tablet Take 1 tablet (5 mg total) by mouth 2 (two) times daily. 60 tablet 2   Cyanocobalamin (VITAMIN B12 PO) Take 1 tablet by mouth daily. Gummies     dexamethasone (DECADRON) 1 MG tablet Take 1 tablet (1 mg total) by mouth daily. 90 tablet 0   feeding supplement (ENSURE ENLIVE / ENSURE PLUS) LIQD Take 237 mLs by mouth 2 (two) times daily between meals.     hydrocortisone (ANUSOL-HC) 2.5 % rectal cream use in rectal area 2 to 3 times a day as needed (Patient taking differently: Place 1 Application rectally daily as needed for hemorrhoids.) 30 g PRN   levETIRAcetam (KEPPRA) 1000 MG tablet Take 1 tablet (1,000 mg total) by mouth 2 (two) times daily. 90 tablet 2   LYSINE PO Take 2 tablets by mouth daily.     magnesium oxide (MAG-OX) 400 (240 Mg) MG tablet Take 1 tablet (400 mg total) by mouth 2 (two) times daily. 60 tablet 0   meclizine (ANTIVERT) 50 MG tablet Take 1 tablet (50 mg total) by mouth 3 (three) times daily. 90 tablet 2    metoprolol succinate (TOPROL-XL) 25 MG 24 hr tablet Take 1 tablet (25 mg total) by mouth daily. 90 tablet 3   Multiple Vitamin (MULTIVITAMIN WITH MINERALS) TABS tablet Take 1 tablet by mouth daily.     Nutritional Supplements (,FEEDING SUPPLEMENT, PROSOURCE PLUS) liquid Take 30 mLs by mouth 2 (two) times daily between meals.     OLANZapine (ZYPREXA) 2.5 MG tablet Take 1 tablet (2.5 mg total) by mouth at bedtime. 90 tablet 1   ondansetron (ZOFRAN) 4  MG tablet TAKE 1 TABLET(4 MG) BY MOUTH EVERY 8 HOURS AS NEEDED FOR NAUSEA OR VOMITING (Patient taking differently: Take 4 mg by mouth every 8 (eight) hours as needed for nausea or vomiting.) 40 tablet 2   OVER THE COUNTER MEDICATION Take 2 tablets by mouth daily. Beet root     oxyCODONE (OXY IR/ROXICODONE) 5 MG immediate release tablet Take 1 tablet (5 mg total) by mouth every 4 (four) hours as needed for severe pain (pain score 7-10). 90 tablet 0   pantoprazole (PROTONIX) 40 MG tablet Take 1 tablet (40 mg total) by mouth daily. 90 tablet 3   polyethylene glycol (MIRALAX / GLYCOLAX) 17 g packet Take 17 g by mouth every evening.     potassium chloride SA (KLOR-CON M) 20 MEQ tablet Take 1 tablet (20 mEq total) by mouth 2 (two) times daily. 90 tablet 1   senna-docusate (SENOKOT-S) 8.6-50 MG tablet Take 2 tablets by mouth daily as needed (Patient preference, Sennakot S for mild constipation). (Patient taking differently: Take 2 tablets by mouth daily as needed for mild constipation.)     spironolactone (ALDACTONE) 25 MG tablet Take 1 tablet (25 mg total) by mouth daily. 90 tablet 3   VITAMIN D PO Take 1 capsule by mouth every evening.     XTAMPZA ER 13.5 MG C12A Take 1 capsule by mouth in the morning and at bedtime. (every 12 hours) 60 capsule 0   No current facility-administered medications for this visit.    REVIEW OF SYSTEMS:   Constitutional: ( - ) fevers, ( - )  chills , ( - ) night sweats Eyes: ( - ) blurriness of vision, ( - ) double vision, ( - )  watery eyes Ears, nose, mouth, throat, and face: ( - ) mucositis, ( - ) sore throat Respiratory: ( - ) cough, ( + ) dyspnea, ( - ) wheezes Cardiovascular: ( - ) palpitation, ( - ) chest discomfort, ( - ) lower extremity swelling Gastrointestinal:  ( + ) nausea, ( - ) heartburn, ( - ) change in bowel habits Skin: ( - ) abnormal skin rashes Lymphatics: ( - ) new lymphadenopathy, ( - ) easy bruising Neurological: ( - ) numbness, ( - ) tingling, ( - ) new weaknesses Behavioral/Psych: ( - ) mood change, ( - ) new changes  All other systems were reviewed with the patient and are negative.  PHYSICAL EXAMINATION: ECOG PERFORMANCE STATUS: 1 - Symptomatic but completely ambulatory  Vitals:   09/02/23 1350  BP: (!) 126/98  Pulse: 78  Resp: 15  Temp: 98 F (36.7 C)  SpO2: 97%       Filed Weights   09/02/23 1350  Weight: 149 lb 14.4 oz (68 kg)     GENERAL: well appearing middle aged Caucasian female in NAD  SKIN: skin color, texture, turgor are normal, no rashes or significant lesions EYES: conjunctiva are pink and non-injected, sclera clear LUNGS: wheezing at lung bases. clear to auscultation and percussion with normal breathing effort HEART: regular rate & rhythm and no murmurs and bilateral trace pitting lower extremity edema Musculoskeletal: no cyanosis of digits and no clubbing PSYCH: alert & oriented x 3, fluent speech NEURO: no focal motor/sensory deficits  LABORATORY DATA:  I have reviewed the data as listed    Latest Ref Rng & Units 09/02/2023   12:44 PM 08/28/2023    4:43 AM 08/27/2023    1:20 AM  CBC  WBC 4.0 - 10.5 K/uL 12.7  12.6  16.9   Hemoglobin 12.0 - 15.0 g/dL 65.7  9.3  84.6   Hematocrit 36.0 - 46.0 % 34.0  27.8  32.1   Platelets 150 - 400 K/uL 579  304  261        Latest Ref Rng & Units 09/02/2023   12:44 PM 08/28/2023    4:43 AM 08/26/2023   12:53 AM  CMP  Glucose 70 - 99 mg/dL 962  952  841   BUN 8 - 23 mg/dL 50  31  39   Creatinine 0.44 - 1.00  mg/dL 3.24  4.01  0.27   Sodium 135 - 145 mmol/L 141  133  138   Potassium 3.5 - 5.1 mmol/L 5.1  3.6  3.9   Chloride 98 - 111 mmol/L 105  97  99   CO2 22 - 32 mmol/L 30  26  28    Calcium 8.9 - 10.3 mg/dL 25.3  7.7  9.2   Total Protein 6.5 - 8.1 g/dL 7.1   6.5   Total Bilirubin <1.2 mg/dL 0.2   0.3   Alkaline Phos 38 - 126 U/L 50   47   AST 15 - 41 U/L 12   35   ALT 0 - 44 U/L 12   41      RADIOGRAPHIC STUDIES: I personally have viewed the radiographic studies below: VAS [name] LOWER EXTREMITY VENOUS (DVT) Result Date: 08/26/2023  Lower Venous DVT Study Patient Name:  Virginia [name]  Date of Exam:   08/26/2023 Medical Rec #: [id number]          Accession #:    [id number] Date of Birth: Apr 07, 1956         Patient Gender: F Patient Age:   88 years Exam Location:  [facility] Procedure:      VAS [name] LOWER EXTREMITY VENOUS (DVT) Referring Phys: Madelyn Flavors --------------------------------------------------------------------------------  Indications: Pulmonary embolism.  Risk Factors: Chemotherapy/metastatic cancer, hx of DVT. Limitations: Poor ultrasound/tissue interface. Comparison Study: Previous exam on 06/05/23 was positive for age indeterminate                   DVT in left PTV Performing Technologist: Ernestene Mention RVT, RDMS  Examination Guidelines: A complete evaluation includes B-mode imaging, spectral Doppler, color Doppler, and power Doppler as needed of all accessible portions of each vessel. Bilateral testing is considered an integral part of a complete examination. Limited examinations for reoccurring indications may be performed as noted. The reflux portion of the exam is performed with the patient in reverse Trendelenburg.  +---------+---------------+---------+-----------+----------+--------------+ RIGHT    CompressibilityPhasicitySpontaneityPropertiesThrombus Aging +---------+---------------+---------+-----------+----------+--------------+ CFV      Full           Yes       Yes                                 +---------+---------------+---------+-----------+----------+--------------+ SFJ      Full                                                        +---------+---------------+---------+-----------+----------+--------------+ FV Prox  Full           Yes      Yes                                 +---------+---------------+---------+-----------+----------+--------------+  FV Mid   Full           Yes      Yes                                 +---------+---------------+---------+-----------+----------+--------------+ FV DistalFull           Yes      Yes                                 +---------+---------------+---------+-----------+----------+--------------+ PFV      Full                                                        +---------+---------------+---------+-----------+----------+--------------+ POP      Full           Yes      Yes                                 +---------+---------------+---------+-----------+----------+--------------+ PTV      Full                                                        +---------+---------------+---------+-----------+----------+--------------+ PERO     Full                                                        +---------+---------------+---------+-----------+----------+--------------+   +---------+---------------+---------+-----------+----------+-------------------+ LEFT     CompressibilityPhasicitySpontaneityPropertiesThrombus Aging      +---------+---------------+---------+-----------+----------+-------------------+ CFV      Full           Yes      Yes                                      +---------+---------------+---------+-----------+----------+-------------------+ SFJ      Full                                                             +---------+---------------+---------+-----------+----------+-------------------+ FV Prox  Full           Yes      Yes                                       +---------+---------------+---------+-----------+----------+-------------------+ FV Mid   Full           Yes      Yes                                      +---------+---------------+---------+-----------+----------+-------------------+  FV DistalFull           Yes      Yes                                      +---------+---------------+---------+-----------+----------+-------------------+ PFV      Full                                                             +---------+---------------+---------+-----------+----------+-------------------+ POP      Full           Yes      Yes                                      +---------+---------------+---------+-----------+----------+-------------------+ PTV      Full                                                             +---------+---------------+---------+-----------+----------+-------------------+ PERO     Full                                         Not well visualized +---------+---------------+---------+-----------+----------+-------------------+     Summary: BILATERAL: - No evidence of deep vein thrombosis seen in the lower extremities, bilaterally. -No evidence of popliteal cyst, bilaterally.   *See table(s) above for measurements and observations. Electronically signed by Gerarda Fraction on 08/26/2023 at 6:45:03 PM.    Final    CT Angio Chest PE W/Cm &/Or Wo Cm Result Date: 08/26/2023 CLINICAL DATA:  Pulmonary embolism, chest pain EXAM: CT ANGIOGRAPHY CHEST WITH CONTRAST TECHNIQUE: Multidetector CT imaging of the chest was performed using the standard protocol during bolus administration of intravenous contrast. Multiplanar CT image reconstructions and MIPs were obtained to evaluate the vascular anatomy. RADIATION DOSE REDUCTION: This exam was performed according to the departmental dose-optimization program which includes automated exposure control, adjustment of the mA and/or kV  according to patient size and/or use of iterative reconstruction technique. CONTRAST:  50mL OMNIPAQUE IOHEXOL 350 MG/ML SOLN COMPARISON:  08/23/2023 FINDINGS: Cardiovascular: There is adequate opacification of the pulmonary arterial tree. Several small intraluminal filling defects are identified within the basal segmental pulmonary arteries of the right lower lobe in keeping with acute pulmonary emboli. The embolic burden is small. The central pulmonary arteries are of normal caliber. There is mild global cardiomegaly with left ventricular enlargement. No CT evidence of right heart strain. Mild coronary artery calcification. Small pericardial effusion has increased in size since prior examination. Moderate atherosclerotic calcification within the thoracic aorta. No aortic aneurysm. Mediastinum/Nodes: No enlarged mediastinal, hilar, or axillary lymph nodes. Thyroid gland, trachea, and esophagus demonstrate no significant findings. Lungs/Pleura: Bibasilar atelectasis with lateral segmental collapse of the right lower lobe. Stable post treatment changes within the medial a right lower lobe abutting the pleural surface and extending into the azygoesophageal recess. A trace right  pleural effusion. No confluent pulmonary infiltrate. No pneumothorax. No central obstructing lesion. Upper Abdomen: No acute abnormality. Musculoskeletal: T8 vertebroplasty. No acute bone abnormality. Mixed lytic and sclerotic lesion within the mid body of the sternum suspicious for a solitary osseous metastasis. This appears new since more remote prior examination of 06/03/2023 Review of the MIP images confirms the above findings. IMPRESSION: 1. Acute pulmonary emboli within the basal segmental pulmonary arteries of the right lower lobe. The embolic burden is small. No CT evidence of right heart strain. 2. Trace right pleural effusion, possibly reactive in nature. 3. Mild global cardiomegaly with left ventricular enlargement. Mild coronary  artery calcification. 4. Small pericardial effusion, increased in size since prior examination. 5. Stable post treatment changes within the medial right lower lobe. 6. Mixed lytic and sclerotic lesion within the mid body of the sternum suspicious for a solitary osseous metastasis. This appears new since more remote prior examination of 06/03/2023. PET CT examination may be helpful for further evaluation. Aortic Atherosclerosis (ICD10-I70.0). Electronically Signed   By: Helyn Numbers M.D.   On: 08/26/2023 03:55   DG Chest Port 1 View Result Date: 08/26/2023 CLINICAL DATA:  Chest pain EXAM: PORTABLE CHEST 1 VIEW COMPARISON:  Radiographs 06/28/2023 and CT 08/23/2023 FINDINGS: Stable cardiomegaly. Aortic atherosclerotic calcification. Bibasilar atelectasis/scarring. Otherwise no focal consolidation. No pleural effusion or pneumothorax. Vertebroplasty of a midthoracic vertebral body. No displaced rib fractures. IMPRESSION: No active disease. Cardiomegaly. Electronically Signed   By: Minerva Fester M.D.   On: 08/26/2023 01:26   CT CHEST ABDOMEN PELVIS W CONTRAST Result Date: 08/24/2023 CLINICAL DATA:  Non-small cell lung cancer restaging * Tracking Code: BO * EXAM: CT CHEST, ABDOMEN, AND PELVIS WITH CONTRAST TECHNIQUE: Multidetector CT imaging of the chest, abdomen and pelvis was performed following the standard protocol during bolus administration of intravenous contrast. RADIATION DOSE REDUCTION: This exam was performed according to the departmental dose-optimization program which includes automated exposure control, adjustment of the mA and/or kV according to patient size and/or use of iterative reconstruction technique. CONTRAST:  OMNIPAQUE IOHEXOL 300 MG/ML  SOLN COMPARISON:  06/03/2023, 05/23/2023 FINDINGS: CT CHEST FINDINGS Cardiovascular: Aortic atherosclerosis. Cardiomegaly. Scattered left coronary artery calcifications. Small pericardial effusion, unchanged. Mediastinum/Nodes: Unchanged, matted  post treatment appearance of soft tissue throughout the mediastinum (series 2, image 27). No discretely enlarged mediastinal, hilar, or axillary lymph nodes. Thyroid gland, trachea, and esophagus demonstrate no significant findings. Lungs/Pleura: Nearly complete resolution of previously seen extensive acute airspace disease and consolidation throughout of the lungs. Bland appearing, bandlike scarring of the bilateral lung bases with scattered small nodular opacities in the apices, for example an irregular opacity in the posterior right apex measuring 1.0 cm (series 4, image 42). Slight interval increase in size of a spiculated nodule in the infrahilar right lower lobe measuring 1.1 x 0.7 cm, previously 0.9 x 0.4 cm (series 4, image 65). Otherwise unchanged post treatment appearance of the perihilar and infrahilar right lung. Musculoskeletal: No chest wall abnormality. No acute osseous findings. CT ABDOMEN PELVIS FINDINGS Hepatobiliary: No focal liver abnormality is seen. Status post cholecystectomy. Unchanged mild postoperative biliary dilatation. Pancreas: Unremarkable. No pancreatic ductal dilatation or surrounding inflammatory changes. Spleen: Normal in size without significant abnormality. Adrenals/Urinary Tract: Adrenal glands are unremarkable. Kidneys are normal, without renal calculi, solid lesion, or hydronephrosis. Bladder is unremarkable. Stomach/Bowel: Stomach is within normal limits. Appendix appears normal. No evidence of bowel wall thickening, distention, or inflammatory changes. Sigmoid diverticulosis. Vascular/Lymphatic: Aortic atherosclerosis. No enlarged abdominal or pelvic lymph nodes.  Reproductive: Status post hysterectomy. Other: No abdominal wall hernia or abnormality. No ascites. Musculoskeletal: No acute osseous findings. Subtle sclerosis of a nondisplaced fracture of the mid sternal body seen acutely on examination dated 06/03/2023 (series 6, image 112). Multiple subacute, partially callused  anterior rib fractures. Unchanged sclerotic wedge deformity of T8 status post vertebral cement augmentation. Additional areas of ill-defined sclerosis unchanged, for example in the superior anterior aspect of L2 (series 6, image 110). IMPRESSION: 1. Nearly complete resolution of previously seen extensive acute airspace disease and consolidation throughout of the lungs. 2. Slight interval increase in size of a spiculated nodule in the infrahilar right lower lobe, concerning for local recurrence. This could be assessed for abnormal metabolic activity by PET-CT if desired, otherwise attention on follow-up. 3. Otherwise unchanged post treatment appearance of the perihilar and infrahilar right lung as well as matted soft tissue throughout the mediastinum. No discretely enlarged lymph nodes. 4. Multiple small unchanged solid and ground-glass pulmonary nodules, largest an irregular nodule in the posterior right apex measuring 1.0 cm. These are nonspecific. Continued attention on follow-up. 5. Subtle sclerosis of a nondisplaced fracture of the mid sternal body as well as multiple partially callused anterior rib fractures, seen acutely on examination dated 06/03/2023 following CPR. 6. Unchanged sclerotic wedge deformity of T8 status post vertebral cement augmentation. Additional areas of ill-defined sclerosis unchanged, nonspecific and possibly reflecting treated osseous metastatic disease. 7. Cardiomegaly and coronary artery disease. Aortic Atherosclerosis (ICD10-I70.0). Electronically Signed   By: Jearld Lesch M.D.   On: 08/24/2023 14:27   MR BRAIN W WO CONTRAST Result Date: 08/19/2023 CLINICAL DATA:  Brain/CNS neoplasm, assess treatment response. Non-small cell lung cancer. EXAM: MRI HEAD WITHOUT AND WITH CONTRAST TECHNIQUE: Multiplanar, multiecho pulse sequences of the brain and surrounding structures were obtained without and with intravenous contrast. COMPARISON:  MRI of the brain May 21, 2023. FINDINGS:  Brain: Multiple enhancing lesions in the bilateral cerebellar hemisphere, the largest in the left cerebellar hemisphere with necrotic center with roughly unchanged size and contrast enhancement when compared to prior MRI. However, T2 hyperintensity involving most of the cerebellar hemispheres the, vermis and middle cerebellar peduncles as well as right side of the medulla have progressed. 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 There is also area of contrast enhancement on the left side protruding through the area of craniotomy, unchanged. 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 Some of the lesions are associated with significant surrounding vasogenic edema, for example anterior left frontal lobe lesion, measuring approximately 11 x 7 mm compared to 9 x 6 mm on prior, 2 posterior left frontal lesions measuring approximately 5 mm and 4 mm compared to 4 mm and 3 mm on prior and right parietal lobe lesion measuring 16 x 11 mm compared to 12 x 11 mm on prior. Three punctate foci of restricted diffusion are seen in the bilateral cerebellar hemispheres (series 3, images 48 and 52). Scattered and confluent T2 hyperintensity within the elsewhere in the white matter of the cerebral hemispheres could be related to microangiopathy and/or posttreatment changes, progressed. Vascular: Normal flow voids. Skull and upper cervical spine: Postsurgical changes from left suboccipital craniotomy. Sinuses/Orbits: Small left maxillary sinus mucous retention cyst and opacification of a posterior left ethmoid cells are unchanged. Left mastoid effusion. The orbits  are maintained. Other: None. IMPRESSION: 1. Evidence of disease  progression with increased size of most of the previously seen enhancing lesions in the supratentorial compartment with mildly progressed vasogenic edema surrounding the largest lesions. 2. Stable size of the infratentorial lesions with progression of the associated T2 hyperintensity involving nearly the entire cerebellum, extending into the middle cerebellar peduncles and right side of the medulla. 3. 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 Electronically Signed   By: Baldemar Lenis M.D.   On: 08/19/2023 15:45     ASSESSMENT & PLAN SHAQUASIA KANESHIRO is a 67 y.o. female with medical history significant for metastatic adenocarcinoma of the lung presents for a follow up visit. 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   Previously we discussed Carboplatin/Pembrolizumab/Pemetrexed and the expected side effect from these medications.  We discussed the possible side effects of immunotherapy including colitis, hepatitis, dermatitis, and pneumonitis.  Additionally we discussed the side effects of carboplatin which include suppression of blood counts, nephrotoxicity, and nausea.  Additionally we discussed pemetrexed which can also have effects on counts and would require supplementation with vitamin B12 and folic acid.  She voiced her understanding of these side effects and treatment plans moving forward.  I also noted that we would be able to drop the chemotherapy agents that she had intolerance to continue with pembrolizumab alone given her high TPS score.  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 Due to worsening dizziness, she resumed this from Cycle 25-27.  --Most  recent CT CAP from 02/16/2023 showed stable disease.  PLAN: --Due for Cycle 56, Day 1 of maintenance Pem/Pem today --Labs from today were reviewed and adequate for treatment.  Labs today show white blood cell count white blood cell 12.7, hemoglobin 1.2, MCV 98.6, platelets 579 --Proceed with treatment today but will hold the pemetrexed.  Will proceed with pembrolizumab alone. --Last CT scan in Sept 2024 showed no evidence of progressive disease.  Repeat CT scan due late Dec 2024.  --RTC q 3 weeks for labs, follow up visit prior to Cycle 55.    #Appetite loss/weight loss: --Encouraged high protein diet and supplement with protein shakes --Advised to eat small, frequent meals.   #Brain Metastasis, stable --appreciate the assistance of Dr. Barbaraann Cao in treating her brain metastasis and symptoms. --radiation therapy to the brain completed on 11/16/2019.  --MRI brain on 04/29/2020 showed evidence of small brain bleed. Eliquis therapy was discontinued.  --MRI on 11/04/2020 showed concern for progression of intracranial disease.  --On 12/02/2020, patient underwent craniotomy, resection of progressive cerebellar mass by Dr. Venetia Maxon. 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  --MRI on 12/23/2020 showed evidence of radiation necrosis. Dr. Barbaraann Cao evaluated the patient on 01/02/2021 with recommendations to add IV Bevacizumab 10 mg/kg q 3 weeks ( started 01/26/2021). Completed a full course of this therapy. --Patient was evaluated by Dr. Barbaraann Cao on 08/29/2021 and recommended to resume Bevacizumab due to worsening dizziness.  Which was given from Cycle 25-27.  Plan: --continue to follow with Dr. Barbaraann Cao  --Most recent MRI from 04/01/2023 showed increased enhancement. Dr. 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 Plan to repeat MRI brain on 08/19/2023.   #  Nausea/Vomiting, worsening --Regimen includes Zofran 4-8mg  q8h PRN and compazine 10mg  q6H for  breakthrough PRN, Olanzpaine 2.5 mg PO QHS to help with nausea.  She is currently using Phenergan for breakthrough. --continue to monitor   #Hypokalemia --likely 2/2 to decreased PO intake, hypovolemia, and BP medications --Potassium level 4.6 today.   --Currently on potassium chloride tablets, 40 mEq PO potassium in AM and in PM.   #Pain Control, stable --continue oxycodone 5mg  q4H PRN. Can increase dose to 10mg  q6H if pain is severe. She is taking approximately 3-4 pills per day.  --continue Xtampza 13.5 mg ER BID for long acting support.  --continue to take with Senokot to prevent opioid induced constipation. 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 Next dose due today.    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    # Hypertension: --Continue Norvasc to 10 mg daily.  --continue to monitor at home and in clinic.    Orders Placed This Encounter  Procedures   TSH    Standing Status:   Future    Expected Date:   10/14/2023    Expiration Date:   10/13/2024   CBC with Differential (Cancer Center Only)    Standing Status:   Future    Expected Date:   10/14/2023    Expiration Date:   10/13/2024   CMP (Cancer Center only)    Standing Status:   Future    Expected Date:   10/14/2023    Expiration Date:   10/13/2024    All questions were answered. The patient knows to call the clinic with any problems, questions or concerns.  I have spent a total of 30 minutes minutes of face-to-face and non-face-to-face time, preparing to see the patient, performing a medically appropriate examination, counseling and educating the patient,  documenting clinical information in the electronic health record, and care coordination.   Ulysees Barns, MD Department of  Hematology/Oncology [facility] at [facility] Phone: [phone] Pager: [phone] Email: [email or web address]  09/02/2023 4:31 PM   Literature Support:  Lynn Ito, Rodrguez-Abreu D, Naaman Plummer, Felip E, De Angelis F, Domine M, [place], Hochmair MJ, [place], [place], [address], [place], Grossi F, [place], Reck M, [place], [place], [place], Rubio-Viqueira B, Novello S, Kurata T, Gray JE, Vida J, Wei Z, Yang J, Raftopoulos H, [place], [place] [employer]; [id number] Investig[employer]. Pembrolizumab plus Chemotherapy in Metastatic Non-Small-Cell Lung Cancer. Malva Limes Med. 2018 May 31;378(22):2078-2092.  --Median progression-free survival was 8.8 months (95% CI, 7.6 to 9.2) in the pembrolizumab-combination group and 4.9 months (95% CI, 4.7 to 5.5) in the placebo-combination group (hazard ratio for disease progression or death, 0.52; 95% CI, 0.43 to 0.64; P<0.001). Adverse events of grade 3 or higher occurred in 67.2% of the patients in the pembrolizumab-combination group and in 65.8% of those in the placebo-combination group.  Cristal Generous L. Efficacy of pemetrexed-based regimens in advanced non-small cell lung cancer patients with activating epidermal growth factor receptor mutations after tyrosine kinase inhibitor failure: a systematic review. Onco Targets Ther. 2018;11:2121-2129.   --The weighted median PFS, median OS, and ORR for patients treated with pem regimens were 5.09 months, 15.91 months, and 30.19%, respectively. Our systematic review  results showed a favorable efficacy profile of pem regimens in NSCLC patients with EGFR mutation after EGFR-TKI failure.

## 2023-09-02 NOTE — Progress Notes (Shared)
Patient Care Team: [name], MD as PCP - General (Internal Medicine) Jaci Standard, MD as PCP - Hematology/Oncology (Hematology and Oncology) Syliva Overman, RN as Oncology Nurse Navigator Edsel Petrin, DO as Consulting Physician [facility] and Palliative Medicine) [facility], Dory Peru, MD as Rounding Team (Cardiology)  I connected with [name] on 09/06/23 at 12:01 PM by video enabled telemedicine visit and verified that I am speaking with the correct person using two identifiers.   I discussed the limitations, risks, security and privacy concerns of performing an evaluation and management service by telemedicine and the availability of in-person appointments. I also discussed with the patient that there may be a patient responsible charge related to this service. The patient expressed understanding and agreed to proceed.   Other persons participating in the visit and their role in the encounter: Medical scribe, Doylene Bode  Patient's location: Home  Provider's location: Clinic   I provided 20 minutes of face-to-face video visit time during this encounter, and > 50% was spent counseling as documented under my assessment & plan. She is identified using two identifiers, Virginia [name], a patient of this practice. She is in her house and I am in my practice. She is agreeable to using this format today.  Chief Complaint: hospital follow-up   Subjective:    Patient ID: Virginia [name] , Female    DOB: 01-Sep-1956, 67 y.o.    MRN: [id number]   67 y.o. Female presents today for hospital follow-up. Admitted to hospital from 08/26/23 - 08/29/23 for precordial pain. Found to have pulmonary embolism. 08/26/23 CTA chest showed acute PE within basal segmental pulmonary arteries in the right lower lobe. 08/26/23 bilateral lower extremity Doppler showed no DVT. Given IV heparin for 48 hours and transitioned to Eliquis on 08/28/23. 08/28/23 blood work showed: WBC  elevated at 12.6, RBC low at 2.85, hemoglobin low at 9.3, HCT low at 27.8, sodium low at 133, chloride low at 97, glucose elevated at 129, BUN elevated at 31, creatinine elevated at 1.34, calcium low at 7.7, GFR low at 44. History of metastatic non-small cell cancer of the lung with brain mets, prior history of VTE, ICH in 2021, hospital cardiac arrest 05/2023. Denies sore throat, cough, fever. Has a mild headache, congestion. Her appetite has been normal. She is accompanied by her husband.  Past Medical History:  Diagnosis Date   Anemia    Anxiety    Concussion 09/28/2019   DVT (deep venous thrombosis) (HCC) 2021   L leg   Dyspnea    GERD (gastroesophageal reflux disease)    Hypercholesterolemia    per pt, she does not have elevated lipids   Hypertension    met lung ca dx'd 09/2019   mets to spine, hip and brain   PONV (postoperative nausea and vomiting)    Tobacco abuse      Family History  Problem Relation Age of Onset   Heart disease Father    Drug abuse Daughter    Drug abuse Son    Cancer Sister    Heart disease Brother    Heart attack Brother     Social History   Social History Narrative   Not on file      Review of Systems  Constitutional:  Negative for fever and malaise/fatigue.  HENT:  Negative for congestion and sore throat.   Eyes:  Negative for blurred vision.  Respiratory:  Negative for cough and shortness of breath.   Cardiovascular:  Negative for chest pain, palpitations and leg swelling.  Gastrointestinal:  Negative for vomiting.  Musculoskeletal:  Negative for back pain.  Skin:  Negative for rash.  Neurological:  Positive for headaches. Negative for loss of consciousness.        Objective:   Vitals: There were no vitals taken for this visit.   Physical Exam Vitals and nursing note reviewed.  Constitutional:      General: She is not in acute distress.    Appearance: Normal appearance. She is not toxic-appearing.  HENT:     Head:  Normocephalic and atraumatic.  Pulmonary:     Effort: Pulmonary effort is normal.  Skin:    General: Skin is warm and dry.  Neurological:     Mental Status: She is alert and oriented to person, place, and time. Mental status is at baseline.  Psychiatric:        Mood and Affect: Mood normal.        Behavior: Behavior normal.        Thought Content: Thought content normal.        Judgment: Judgment normal.       Results:   Studies obtained and personally reviewed by me:  08/26/23 CTA chest showed acute PE within basal segmental pulmonary arteries in the right lower lobe.  08/26/23 bilateral lower extremity Doppler showed no DVT.  Labs:       Component Value Date/Time   NA 141 09/02/2023 1244   K 5.1 09/02/2023 1244   CL 105 09/02/2023 1244   CO2 30 09/02/2023 1244   GLUCOSE 108 (H) 09/02/2023 1244   BUN 50 (H) 09/02/2023 1244   CREATININE 1.24 (H) 09/02/2023 1244   CREATININE 0.58 11/15/2017 0948   CALCIUM 10.6 (H) 09/02/2023 1244   PROT 7.1 09/02/2023 1244   ALBUMIN 3.3 (L) 09/02/2023 1244   AST 12 (L) 09/02/2023 1244   ALT 12 09/02/2023 1244   ALKPHOS 50 09/02/2023 1244   BILITOT 0.2 09/02/2023 1244   GFRNONAA 48 (L) 09/02/2023 1244   GFRNONAA 100 11/15/2017 0948   GFRAA >60 06/09/2020 1051   GFRAA 116 11/15/2017 0948     Lab Results  Component Value Date   WBC 12.7 (H) 09/02/2023   HGB 11.2 (L) 09/02/2023   HCT 34.0 (L) 09/02/2023   MCV 98.6 09/02/2023   PLT 579 (H) 09/02/2023    Lab Results  Component Value Date   CHOL 235 (H) 01/12/2019   HDL 92 01/12/2019   LDLCALC 117 (H) 01/12/2019   TRIG 134 06/05/2023   CHOLHDL 2.6 01/12/2019    Lab Results  Component Value Date   HGBA1C 7.3 (H) 06/03/2023     Lab Results  Component Value Date   TSH 1.105 09/02/2023      Assessment & Plan:   Respiratory congestion: given Z-Pak to have on-hand if needed for persistent symptoms. Contact us if symptoms worsen or fail to improve.    I,Alexander  Ruley,acting as a Neurosurgeon for [name], MD.,have documented all relevant documentation on the behalf of [name], MD,as directed by  [name], MD while in the presence of [name], MD.   ***

## 2023-09-06 ENCOUNTER — Telehealth: Payer: Self-pay | Admitting: Internal Medicine

## 2023-09-06 ENCOUNTER — Telehealth: Payer: Medicare Other | Admitting: Internal Medicine

## 2023-09-06 DIAGNOSIS — R5381 Other malaise: Secondary | ICD-10-CM

## 2023-09-06 DIAGNOSIS — Z8674 Personal history of sudden cardiac arrest: Secondary | ICD-10-CM

## 2023-09-06 DIAGNOSIS — C7801 Secondary malignant neoplasm of right lung: Secondary | ICD-10-CM

## 2023-09-06 DIAGNOSIS — Z09 Encounter for follow-up examination after completed treatment for conditions other than malignant neoplasm: Secondary | ICD-10-CM

## 2023-09-06 MED ORDER — AZITHROMYCIN 250 MG PO TABS: ORAL_TABLET | ORAL | 0 refills | Status: AC

## 2023-09-06 NOTE — Telephone Encounter (Signed)
Called patient to schedule in basket message with Dr. Barbaraann Cao week of 01/27... Unable to schedule for the week of 01/27.. Able to schedule on 02/03 @1230 .

## 2023-09-06 NOTE — Telephone Encounter (Signed)
Received order # [id number] from [facility] to review sign and fax back to [phone]. Resumption of care.             -------Fax Transmission Report-------  To:               Recipient at [phone] Subject:          Fw: Hp Scans Result:           The transmission was successful. Explanation:      All Pages Ok Pages Sent:       8 Connect Time:     4 minutes, 50 seconds Transmit Time:    09/06/2023 11:05 Transfer Rate:    14400 Status Code:      0000 Retry Count:      0 Job Id:           [id number] Unique Id:        [id number] Fax Line:         65 Fax Server:       [id number]

## 2023-09-09 ENCOUNTER — Other Ambulatory Visit: Payer: Self-pay | Admitting: Internal Medicine

## 2023-09-10 ENCOUNTER — Encounter: Payer: Self-pay | Admitting: Internal Medicine

## 2023-09-10 NOTE — Patient Instructions (Addendum)
Patient is alert and talkative which is encouraging.  She was given a Zithromax Z-PAK to have on hand if needed for respiratory infection symptoms.  Husband is supportive.  Home health/hospice services in place.  Contact us at any time.

## 2023-09-13 ENCOUNTER — Encounter: Payer: Self-pay | Admitting: Internal Medicine

## 2023-09-13 ENCOUNTER — Telehealth (INDEPENDENT_AMBULATORY_CARE_PROVIDER_SITE_OTHER): Payer: Medicare Other | Admitting: Internal Medicine

## 2023-09-13 VITALS — BP 130/85 | Ht 62.0 in | Wt 147.0 lb

## 2023-09-13 DIAGNOSIS — J069 Acute upper respiratory infection, unspecified: Secondary | ICD-10-CM | POA: Diagnosis not present

## 2023-09-13 DIAGNOSIS — R5381 Other malaise: Secondary | ICD-10-CM

## 2023-09-13 DIAGNOSIS — C7801 Secondary malignant neoplasm of right lung: Secondary | ICD-10-CM

## 2023-09-13 DIAGNOSIS — I468 Cardiac arrest due to other underlying condition: Secondary | ICD-10-CM

## 2023-09-13 DIAGNOSIS — Z8674 Personal history of sudden cardiac arrest: Secondary | ICD-10-CM

## 2023-09-13 DIAGNOSIS — E7439 Other disorders of intestinal carbohydrate absorption: Secondary | ICD-10-CM

## 2023-09-13 NOTE — Progress Notes (Signed)
 Patient Care Team: Virginia Virginia PARAS, MD as PCP - General (Internal Medicine) Virginia Norleen ONEIDA MADISON, MD as PCP - Hematology/Oncology (Hematology and Oncology) Virginia Lonell BROCKS, RN as Oncology Nurse Navigator Virginia Almarie CROME, DO as Consulting Physician [facility] and Palliative Medicine) Lbcardiology, Rande, MD as Rounding Team (Cardiology)  I connected with Virginia [name] on 09/13/23 at 12:31 PM by video enabled telemedicine visit and verified that I am speaking with the correct person using two identifiers, myself and Virginia [name], CMA. I am in my office and patient is in their home.   I discussed the limitations, risks, security and privacy concerns of performing an evaluation and management service by telemedicine and the availability of in-person appointments. I also discussed with the patient that there may be a patient responsible charge related to this service. The patient expressed understanding and agreed to proceed.   Other persons participating in the visit and their role in the encounter: Medical scribe, Virginia [name]  Patient's location: Home  Provider's location: Clinic   I provided 15 minutes of face-to-face video visit time during this encounter, and > 50% was spent counseling as documented under my assessment & plan.   Chief Complaint: Congestion   Subjective:    Patient ID: Virginia [name] , Female    DOB: [birth year], 68 y.o.    MRN: [id number]   68 y.o. Female presents today for sick visit with congestion. Patient has a past medical history of metastatic lung cancer. Endorses rhinorrhea, congestion, headaches, some cough, post-nasal drip, and dizziness. Denies SOB or vertigo. Staying well hydrated. Reports that her symptoms have been going on since before her hospitalization on 12/16. She is still on 1 mg daily steroid. Is UTD on flu vaccine.   Past Medical History:  Diagnosis Date   Anemia    Anxiety    Concussion 09/28/2019   DVT (deep venous thrombosis)  (HCC) 2021   L leg   Dyspnea    GERD (gastroesophageal reflux disease)    Hypercholesterolemia    per pt, she does not have elevated lipids   Hypertension    met lung ca dx'd 09/2019   mets to spine, hip and brain   PONV (postoperative nausea and vomiting)    Tobacco abuse     Family History  Problem Relation Age of Onset   Heart disease Father    Drug abuse Daughter    Drug abuse Son    Cancer Sister    Heart disease Brother    Heart attack Brother    Social History   Social History Narrative   Not on file   Review of Systems  Constitutional:  Negative for fever and malaise/fatigue.  HENT:  Positive for congestion.        (+) Rhinorrhea (+) Post-nasal drip   Eyes:  Negative for blurred vision.  Respiratory:  Positive for cough (some). Negative for shortness of breath.   Cardiovascular:  Negative for chest pain, palpitations and leg swelling.  Gastrointestinal:  Negative for vomiting.  Musculoskeletal:  Negative for back pain.  Skin:  Negative for rash.  Neurological:  Positive for dizziness and headaches (attributed to congestion). Negative for loss of consciousness.     Objective:   Vitals: There were no vitals taken for this visit.  Physical Exam Vitals and nursing note reviewed.  Constitutional:      General: She is not in acute distress.    Appearance: Normal appearance. She is not toxic-appearing.  HENT:  Head: Normocephalic and atraumatic.  Pulmonary:     Effort: Pulmonary effort is normal.  Skin:    General: Skin is warm and dry.  Neurological:     Mental Status: She is alert and oriented to person, place, and time. Mental status is at baseline.  Psychiatric:        Mood and Affect: Mood normal.        Behavior: Behavior normal.        Thought Content: Thought content normal.        Judgment: Judgment normal.    Results:  Studies obtained and personally reviewed by me:  Labs:      Component Value Date/Time   NA 141 09/02/2023 1244   K  5.1 09/02/2023 1244   CL 105 09/02/2023 1244   CO2 30 09/02/2023 1244   GLUCOSE 108 (H) 09/02/2023 1244   BUN 50 (H) 09/02/2023 1244   CREATININE 1.24 (H) 09/02/2023 1244   CREATININE 0.58 11/15/2017 0948   CALCIUM  10.6 (H) 09/02/2023 1244   PROT 7.1 09/02/2023 1244   ALBUMIN 3.3 (L) 09/02/2023 1244   AST 12 (L) 09/02/2023 1244   ALT 12 09/02/2023 1244   ALKPHOS 50 09/02/2023 1244   BILITOT 0.2 09/02/2023 1244   GFRNONAA 48 (L) 09/02/2023 1244   GFRNONAA 100 11/15/2017 0948   GFRAA >60 06/09/2020 1051   GFRAA 116 11/15/2017 0948   Lab Results  Component Value Date   WBC 12.7 (H) 09/02/2023   HGB 11.2 (L) 09/02/2023   HCT 34.0 (L) 09/02/2023   MCV 98.6 09/02/2023   PLT 579 (H) 09/02/2023   Lab Results  Component Value Date   CHOL 235 (H) 01/12/2019   HDL 92 01/12/2019   LDLCALC 117 (H) 01/12/2019   TRIG 134 06/05/2023   CHOLHDL 2.6 01/12/2019   Lab Results  Component Value Date   HGBA1C 7.3 (H) 06/03/2023    Lab Results  Component Value Date   TSH 1.105 09/02/2023   Assessment & Plan:   Acute Upper Respiratory Infection: UTD on Flu vaccine. Start Zithromax  Z pak- take as directed. Has one on hand already. Try Chlortrimeton for rhinorrhea and congestion. Stay well hydrated, well rested, and well nourished; let us  know if symptoms worsen/persist despite treatment.   I,Emily Lagle,acting as a neurosurgeon for Virginia JINNY Hailstone, MD.,have documented all relevant documentation on the behalf of Virginia JINNY Hailstone, MD,as directed by  Virginia JINNY Hailstone, MD while in the presence of Virginia JINNY Hailstone, MD.   I, Virginia JINNY Hailstone, MD, have reviewed all documentation for this visit. The documentation on 09/22/23 for the exam, diagnosis, procedures, and orders are all accurate and complete.

## 2023-09-18 ENCOUNTER — Encounter
Payer: Medicare Other | Attending: Physical Medicine and Rehabilitation | Admitting: Physical Medicine and Rehabilitation

## 2023-09-18 VITALS — BP 136/89

## 2023-09-18 DIAGNOSIS — R5381 Other malaise: Secondary | ICD-10-CM | POA: Diagnosis not present

## 2023-09-18 DIAGNOSIS — I469 Cardiac arrest, cause unspecified: Secondary | ICD-10-CM | POA: Insufficient documentation

## 2023-09-18 DIAGNOSIS — C7931 Secondary malignant neoplasm of brain: Secondary | ICD-10-CM | POA: Insufficient documentation

## 2023-09-18 NOTE — Progress Notes (Signed)
 Subjective:    Patient ID: Virginia [name], female    DOB: [birth year], 68 y.o.   MRN: [id number]  HPI  Virtual Visit via Video Note  I connected with Virginia [name] on 09/18/23 at  2:40 PM EST by a video enabled telemedicine application and verified that I am speaking with the correct person using two identifiers.  Location: Patient: home Provider: office   I discussed the limitations of evaluation and management by telemedicine and the availability of in person appointments. The patient expressed understanding and agreed to proceed.    The patient was advised to call back or seek an in-person evaluation if the symptoms worsen or if the condition fails to improve as anticipated.  I provided 19 minutes of non-face-to-face time during this encounter.   Virginia [name] is a 68 y.o. year old female  who  has a past medical history of Anemia, Anxiety, Concussion (09/28/2019), DVT (deep venous thrombosis) (HCC) (2021), Dyspnea, GERD (gastroesophageal reflux disease), Hypercholesterolemia, Hypertension, met lung ca (dx'd 09/2019), PONV (postoperative nausea and vomiting), and Tobacco abuse.   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  [name]  Plan from last visit:  Malignant neoplasm metastatic to brain [facility]) I agree with getting home SLP therapies if possible.   I did not see any new signs of neurologic changes on today's exam, but given the increased dizziness, I would talk with Dr. Federico about any further imaging or medication adjustments if needed.    Feel free to contact me between appointments if any needs or concerns.   Cardiac arrest [facility]) Follow-up with cardiology as scheduled, they should be managing your blood pressure and diuretic medications at this point.  Look for signs of swelling in your hands, feet, or thighs, as well as weight changes of more than 2 to 3 pounds in 1 day or  more than 5 pounds in a week to guide whether you are starting to gain fluid.  Shortness of breath can also be a sign of gaining fluid, and in any of these cases, contact her cardiologist or seek emergency care.   Debility Follow-up with me in 2 months for a general review of your quality of life, functional status, and to assess any areas of need as we finished up home therapies.     Interval Hx:  - Therapies: She has nursing aides coming in and PT [facility] coming in; her nurses aide has been off for 2 weeks since coming home and her first visit was supposed to be Monday but she held off due to Arleny's recent URI.   She is not generally ambulating but exercising is hard; PT uses a walker and a gait belt, but will sometimes walk with the walker independently. Her husband uses the gait belt to get her to the living room.   OT in rehab worked on [employer], which she loves. She also does seated dancing; likes Euel Maffucci, Cheri Mattock and [employer].   - Follow ups: Recently had a URI and got a z pack, feeling much better for a little bit then came back, in contact with them again before the weekend.  Saw Dr. Lafonda PA since last visit, have not seen Dr. Buckley in awhile.    - Falls: none since discharge   - DME: They have 4 steps down the steps to the garage, using a step and a minivan  along with the handrails bilaterally. she can navigate down those steps ok.  She uses a walker in the home and a WC for home and community transportation. She has a hospital bed with    - Medications: she is still on prednisone  1 mg - still dizzy but not worse. Has noticed swelling in her feet has gone down recently.    - Other concerns: She was admitted for a blood clot in her lungs since last visit. She was started on Eliquis , which she has tolerated very well. She has had no residual breathing issues from the clot.   She's been having headaches a lot recently; they come and go spontaneously, usually  on her right side, she takes tylenol  500 mg rarely and that will take care of it.   Pain Inventory Average Pain 8 Pain Right Now 8 My pain is aching  In the last 24 hours, has pain interfered with the following? General activity 8 Relation with others 8 Enjoyment of life 8 What TIME of day is your pain at its worst? evening Sleep (in general) Good  Pain is worse with: standing Pain improves with: medication Relief from Meds: 7  Family History  Problem Relation Age of Onset   Heart disease Father    Drug abuse Daughter    Drug abuse Son    Cancer Sister    Heart disease Brother    Heart attack Brother    Social History   Socioeconomic History   Marital status: Married    Spouse name: Not on file   Number of children: 3   Years of education: Not on file   Highest education level: Not on file  Occupational History   Occupation: owns [employer]: [employer]  Tobacco Use   Smoking status: Former    Current packs/day: 0.00    Types: Cigarettes    Quit date: 10/31/2012    Years since quitting: 10.8   Smokeless tobacco: Never  Vaping Use   Vaping status: Never Used  Substance and Sexual Activity   Alcohol use: Yes    Alcohol/week: 0.0 standard drinks of alcohol    Comment: rare   Drug use: No   Sexual activity: Yes  Other Topics Concern   Not on file  Social History Narrative   Not on file   Social Drivers of Health   Financial Resource Strain: Not on file  Food Insecurity: No Food Insecurity (08/26/2023)   Hunger Vital Sign    Worried About Running Out of Food in the Last Year: Never true    Ran Out of Food in the Last Year: Never true  Transportation Needs: No Transportation Needs (08/26/2023)   PRAPARE - Administrator, Civil Service (Medical): No    Lack of Transportation (Non-Medical): No  Physical Activity: Not on file  Stress: Not on file  Social Connections: Not on file   Past Surgical History:  Procedure Laterality  Date   ABDOMINAL HYSTERECTOMY     partial/ left ovaries   APPLICATION OF CRANIAL NAVIGATION N/A 12/02/2020   Procedure: APPLICATION OF CRANIAL NAVIGATION;  Surgeon: [name], MD;  Location: [facility] OR;  Service: Neurosurgery;  Laterality: N/A;   CHOLECYSTECTOMY     CRANIOTOMY N/A 12/02/2020   Procedure: Posterior fossa craniotomy for tumor resection with brainlab;  Surgeon: [name], MD;  [facility];  Service: Neurosurgery;  Laterality: N/A;   DILATION AND CURETTAGE OF UTERUS     IR  IMAGING GUIDED PORT INSERTION  10/23/2019   IR PATIENT EVAL TECH 0-60 MINS  11/21/2021   IR PATIENT EVAL TECH 0-60 MINS  11/24/2021   IR PATIENT EVAL TECH 0-60 MINS  11/29/2021   IR PATIENT EVAL TECH 0-60 MINS  12/04/2021   IR PATIENT EVAL TECH 0-60 MINS  12/12/2021   IR PATIENT EVAL TECH 0-60 MINS  12/19/2021   IR PATIENT EVAL TECH 0-60 MINS  12/27/2021   IR PATIENT EVAL TECH 0-60 MINS  01/03/2022   IR PATIENT EVAL TECH 0-60 MINS  01/10/2022   IR PATIENT EVAL TECH 0-60 MINS  01/18/2022   IR PATIENT EVAL TECH 0-60 MINS  02/13/2022   IR PATIENT EVAL TECH 0-60 MINS  02/20/2022   IR RADIOLOGIST EVAL & MGMT  01/23/2022   IR REMOVAL TUN ACCESS W/ PORT W/O FL MOD SED  11/17/2021   KYPHOPLASTY N/A 03/15/2020   Procedure: Thoracic Eight KYPHOPLASTY;  Surgeon: [name], MD;  Location: [facility] OR;  Service: Neurosurgery;  Laterality: N/A;  prone    LIPOMA EXCISION  2018   removed under left breast and right thigh.   TUBAL LIGATION     Past Surgical History:  Procedure Laterality Date   ABDOMINAL HYSTERECTOMY     partial/ left ovaries   APPLICATION OF CRANIAL NAVIGATION N/A 12/02/2020   Procedure: APPLICATION OF CRANIAL NAVIGATION;  Surgeon: [name], MD;  Location: [facility] OR;  Service: Neurosurgery;  Laterality: N/A;   CHOLECYSTECTOMY     CRANIOTOMY N/A 12/02/2020   Procedure: Posterior fossa craniotomy for tumor resection with brainlab;  Surgeon: [name], MD;  Location: [facility] OR;  Service: Neurosurgery;  Laterality:  N/A;   DILATION AND CURETTAGE OF UTERUS     IR IMAGING GUIDED PORT INSERTION  10/23/2019   IR PATIENT EVAL TECH 0-60 MINS  11/21/2021   IR PATIENT EVAL TECH 0-60 MINS  11/24/2021   IR PATIENT EVAL TECH 0-60 MINS  11/29/2021   IR PATIENT EVAL TECH 0-60 MINS  12/04/2021   IR PATIENT EVAL TECH 0-60 MINS  12/12/2021   IR PATIENT EVAL TECH 0-60 MINS  12/19/2021   IR PATIENT EVAL TECH 0-60 MINS  12/27/2021   IR PATIENT EVAL TECH 0-60 MINS  01/03/2022   IR PATIENT EVAL TECH 0-60 MINS  01/10/2022   IR PATIENT EVAL TECH 0-60 MINS  01/18/2022   IR PATIENT EVAL TECH 0-60 MINS  02/13/2022   IR PATIENT EVAL TECH 0-60 MINS  02/20/2022   IR RADIOLOGIST EVAL & MGMT  01/23/2022   IR REMOVAL TUN ACCESS W/ PORT W/O FL MOD SED  11/17/2021   KYPHOPLASTY N/A 03/15/2020   Procedure: Thoracic Eight KYPHOPLASTY;  Surgeon: [name], MD;  Location: [facility] OR;  Service: Neurosurgery;  Laterality: N/A;  prone    LIPOMA EXCISION  2018   removed under left breast and right thigh.   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  Opioid Risk Score:  Fall Risk Score:  `1  Depression screen PHQ 2/9     07/15/2023    2:22 PM 11/29/2021   10:00 AM 08/15/2021   11:26 AM 04/22/2018    4:04 PM 12/07/2016    6:51 PM  Depression screen PHQ 2/9  Decreased Interest 0 0 0 0 0  Down, Depressed, Hopeless 0 0 1 0 0  PHQ - 2 Score 0 0 1 0 0  Altered sleeping 0      Tired, decreased energy 3      Change in appetite 0      Feeling bad or failure about yourself  0      Trouble concentrating 0      Moving slowly or  fidgety/restless 1      Suicidal thoughts 0      PHQ-9 Score 4          Review of Systems  Musculoskeletal:  Positive for back pain and gait problem.  All other systems reviewed and are negative.     Objective:   Physical Exam   PE: Constitution: Appropriate appearance for age. No apparent distress  Resp: No respiratory distress. No accessory muscle usage.  Cardio: Well perfused appearance. Trace edema bilateral lower extremities Abdomen: Nondistended.    Psych: Appropriate mood and affect.  Neuro: AAOx4.   Moderate cognitive deficits  + dysarthria  All 4 extremities antigravity; LUE motor delay, fine motor deficits     Assessment & Plan:  ALEGANDRA SOMMERS is a 68 y.o. year old female  who  has a past medical history of Anemia, Anxiety, Concussion (09/28/2019), DVT (deep venous thrombosis) (HCC) (2021), Dyspnea, GERD (gastroesophageal reflux disease), Hypercholesterolemia, Hypertension, met lung ca (dx'd 09/2019), PONV (postoperative nausea and vomiting), and Tobacco abuse.   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  [name]  Debility Malignant neoplasm metastatic to brain [facility]) Cardiac arrest [facility]) Patient with significant functional deficits due to metastatic cancer with brain lesions, currently managed by Dr. Federico and Dr. Buckley.   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  She is ambulatory with a walker for household distances with a small level of assistance from her husband, and reliant on him and caregivers for ADLs  She is satisfied with her current quality of life and is getting back to recreational activities her and her husband enjoy, like dancing once per week or coloring in her coloring book.  She is maintaining expected functional gains given her current comorbidities. No further  role for PM&R at this time; patient can follow up PRN if any future concerns or questions.  It has been a pleasure caring for Mrs. Karl!

## 2023-09-20 ENCOUNTER — Other Ambulatory Visit (HOSPITAL_COMMUNITY): Payer: Self-pay

## 2023-09-22 ENCOUNTER — Encounter: Payer: Self-pay | Admitting: Internal Medicine

## 2023-09-22 NOTE — Patient Instructions (Signed)
 You  appear to have an acute upper respiratory infection. A zithromax Z pak was previously called in for you. Please start this medication today and take as directed. Please stay well hydrated.  Call if not improving within a few days.

## 2023-09-23 ENCOUNTER — Inpatient Hospital Stay (HOSPITAL_BASED_OUTPATIENT_CLINIC_OR_DEPARTMENT_OTHER): Payer: Medicare Other | Admitting: Hematology and Oncology

## 2023-09-23 ENCOUNTER — Inpatient Hospital Stay: Payer: Medicare Other

## 2023-09-23 ENCOUNTER — Encounter: Payer: Self-pay | Admitting: Nurse Practitioner

## 2023-09-23 ENCOUNTER — Inpatient Hospital Stay (HOSPITAL_BASED_OUTPATIENT_CLINIC_OR_DEPARTMENT_OTHER): Payer: Medicare Other | Admitting: Nurse Practitioner

## 2023-09-23 ENCOUNTER — Inpatient Hospital Stay: Payer: Medicare Other | Attending: Hematology and Oncology

## 2023-09-23 VITALS — BP 138/85 | HR 78 | Temp 97.9°F | Resp 16

## 2023-09-23 DIAGNOSIS — Z7401 Bed confinement status: Secondary | ICD-10-CM | POA: Insufficient documentation

## 2023-09-23 DIAGNOSIS — C3490 Malignant neoplasm of unspecified part of unspecified bronchus or lung: Secondary | ICD-10-CM | POA: Diagnosis not present

## 2023-09-23 DIAGNOSIS — C7951 Secondary malignant neoplasm of bone: Secondary | ICD-10-CM

## 2023-09-23 DIAGNOSIS — G893 Neoplasm related pain (acute) (chronic): Secondary | ICD-10-CM

## 2023-09-23 DIAGNOSIS — C3431 Malignant neoplasm of lower lobe, right bronchus or lung: Secondary | ICD-10-CM | POA: Diagnosis present

## 2023-09-23 DIAGNOSIS — Z7901 Long term (current) use of anticoagulants: Secondary | ICD-10-CM | POA: Diagnosis not present

## 2023-09-23 DIAGNOSIS — K219 Gastro-esophageal reflux disease without esophagitis: Secondary | ICD-10-CM | POA: Insufficient documentation

## 2023-09-23 DIAGNOSIS — K5903 Drug induced constipation: Secondary | ICD-10-CM

## 2023-09-23 DIAGNOSIS — E876 Hypokalemia: Secondary | ICD-10-CM | POA: Diagnosis not present

## 2023-09-23 DIAGNOSIS — J90 Pleural effusion, not elsewhere classified: Secondary | ICD-10-CM | POA: Diagnosis not present

## 2023-09-23 DIAGNOSIS — Z8673 Personal history of transient ischemic attack (TIA), and cerebral infarction without residual deficits: Secondary | ICD-10-CM | POA: Diagnosis not present

## 2023-09-23 DIAGNOSIS — Z79899 Other long term (current) drug therapy: Secondary | ICD-10-CM | POA: Insufficient documentation

## 2023-09-23 DIAGNOSIS — Z5112 Encounter for antineoplastic immunotherapy: Secondary | ICD-10-CM | POA: Insufficient documentation

## 2023-09-23 DIAGNOSIS — Z923 Personal history of irradiation: Secondary | ICD-10-CM | POA: Diagnosis not present

## 2023-09-23 DIAGNOSIS — Z515 Encounter for palliative care: Secondary | ICD-10-CM | POA: Diagnosis not present

## 2023-09-23 DIAGNOSIS — I700 Atherosclerosis of aorta: Secondary | ICD-10-CM | POA: Insufficient documentation

## 2023-09-23 DIAGNOSIS — Z993 Dependence on wheelchair: Secondary | ICD-10-CM | POA: Diagnosis not present

## 2023-09-23 DIAGNOSIS — R918 Other nonspecific abnormal finding of lung field: Secondary | ICD-10-CM | POA: Diagnosis not present

## 2023-09-23 DIAGNOSIS — I2699 Other pulmonary embolism without acute cor pulmonale: Secondary | ICD-10-CM | POA: Insufficient documentation

## 2023-09-23 DIAGNOSIS — Z87891 Personal history of nicotine dependence: Secondary | ICD-10-CM | POA: Insufficient documentation

## 2023-09-23 DIAGNOSIS — I119 Hypertensive heart disease without heart failure: Secondary | ICD-10-CM | POA: Diagnosis not present

## 2023-09-23 DIAGNOSIS — C7931 Secondary malignant neoplasm of brain: Secondary | ICD-10-CM | POA: Insufficient documentation

## 2023-09-23 DIAGNOSIS — Z79620 Long term (current) use of immunosuppressive biologic: Secondary | ICD-10-CM | POA: Diagnosis not present

## 2023-09-23 DIAGNOSIS — K5900 Constipation, unspecified: Secondary | ICD-10-CM | POA: Diagnosis not present

## 2023-09-23 DIAGNOSIS — Z9049 Acquired absence of other specified parts of digestive tract: Secondary | ICD-10-CM | POA: Insufficient documentation

## 2023-09-23 DIAGNOSIS — Z86718 Personal history of other venous thrombosis and embolism: Secondary | ICD-10-CM | POA: Diagnosis not present

## 2023-09-23 DIAGNOSIS — R530 Neoplastic (malignant) related fatigue: Secondary | ICD-10-CM | POA: Diagnosis not present

## 2023-09-23 DIAGNOSIS — Z90710 Acquired absence of both cervix and uterus: Secondary | ICD-10-CM | POA: Insufficient documentation

## 2023-09-23 DIAGNOSIS — I3139 Other pericardial effusion (noninflammatory): Secondary | ICD-10-CM | POA: Diagnosis not present

## 2023-09-23 LAB — CBC WITH DIFFERENTIAL (CANCER CENTER ONLY)
Abs Immature Granulocytes: 0.2 10*3/uL — ABNORMAL HIGH (ref 0.00–0.07)
Basophils Absolute: 0.1 10*3/uL (ref 0.0–0.1)
Basophils Relative: 0 %
Eosinophils Absolute: 0.2 10*3/uL (ref 0.0–0.5)
Eosinophils Relative: 1 %
HCT: 34.6 % — ABNORMAL LOW (ref 36.0–46.0)
Hemoglobin: 11.5 g/dL — ABNORMAL LOW (ref 12.0–15.0)
Immature Granulocytes: 2 %
Lymphocytes Relative: 28 %
Lymphs Abs: 3.3 10*3/uL (ref 0.7–4.0)
MCH: 32.4 pg (ref 26.0–34.0)
MCHC: 33.2 g/dL (ref 30.0–36.0)
MCV: 97.5 fL (ref 80.0–100.0)
Monocytes Absolute: 1.2 10*3/uL — ABNORMAL HIGH (ref 0.1–1.0)
Monocytes Relative: 10 %
Neutro Abs: 6.7 10*3/uL (ref 1.7–7.7)
Neutrophils Relative %: 59 %
Platelet Count: 215 10*3/uL (ref 150–400)
RBC: 3.55 MIL/uL — ABNORMAL LOW (ref 3.87–5.11)
RDW: 13.3 % (ref 11.5–15.5)
WBC Count: 11.5 10*3/uL — ABNORMAL HIGH (ref 4.0–10.5)
nRBC: 0 % (ref 0.0–0.2)

## 2023-09-23 LAB — CMP (CANCER CENTER ONLY)
ALT: 11 U/L (ref 0–44)
AST: 13 U/L — ABNORMAL LOW (ref 15–41)
Albumin: 3.2 g/dL — ABNORMAL LOW (ref 3.5–5.0)
Alkaline Phosphatase: 42 U/L (ref 38–126)
Anion gap: 4 — ABNORMAL LOW (ref 5–15)
BUN: 51 mg/dL — ABNORMAL HIGH (ref 8–23)
CO2: 30 mmol/L (ref 22–32)
Calcium: 9.4 mg/dL (ref 8.9–10.3)
Chloride: 105 mmol/L (ref 98–111)
Creatinine: 1.24 mg/dL — ABNORMAL HIGH (ref 0.44–1.00)
GFR, Estimated: 48 mL/min — ABNORMAL LOW (ref >60)
Glucose, Bld: 116 mg/dL — ABNORMAL HIGH (ref 70–99)
Potassium: 4.3 mmol/L (ref 3.5–5.1)
Sodium: 139 mmol/L (ref 135–145)
Total Bilirubin: 0.2 mg/dL (ref 0.0–1.2)
Total Protein: 6.4 g/dL — ABNORMAL LOW (ref 6.5–8.1)

## 2023-09-23 LAB — TSH: TSH: 1.29 u[IU]/mL (ref 0.350–4.500)

## 2023-09-23 MED ORDER — XTAMPZA ER 13.5 MG PO C12A: 1.0000 | EXTENDED_RELEASE_CAPSULE | Freq: Two times a day (BID) | ORAL | 0 refills | Status: DC

## 2023-09-23 MED ORDER — SODIUM CHLORIDE 0.9 % IV SOLN
200.0000 mg | Freq: Once | INTRAVENOUS | Status: AC
  Administered 2023-09-23: 200 mg via INTRAVENOUS
  Filled 2023-09-23: qty 200

## 2023-09-23 MED ORDER — SODIUM CHLORIDE 0.9 % IV SOLN: Freq: Once | INTRAVENOUS | Status: AC

## 2023-09-23 MED ORDER — PROCHLORPERAZINE MALEATE 10 MG PO TABS
10.0000 mg | ORAL_TABLET | Freq: Once | ORAL | Status: DC
Start: 2023-09-23 — End: 2023-09-23

## 2023-09-23 MED ORDER — OXYCODONE HCL 5 MG PO TABS: 5.0000 mg | ORAL_TABLET | ORAL | 0 refills | Status: DC | PRN

## 2023-09-23 NOTE — Progress Notes (Signed)
 [facility] [phone]   [phone]  PROGRESS NOTE  Patient Care Team: Perri Ronal PARAS, MD as PCP - General (Internal Medicine) Federico Norleen ONEIDA MADISON, MD as PCP - Hematology/Oncology (Hematology and Oncology) Evertt Lonell BROCKS, RN as Oncology Nurse Navigator Marvine Almarie CROME, DO as Consulting Physician [facility] and Palliative Medicine) Lbcardiology, Rande, MD as Rounding Team (Cardiology) Pickenpack-Cousar, Fannie SAILOR, NP as Nurse Practitioner (Hospice and Palliative Medicine)  Hematological/Oncological History # 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 CT of the head showed showed concerning for metastatic disease involving the left cerebellum and right occipital lobe.  2) 09/30/2019: MRI brain confirms mass within the left cerebellum measures 1.6 x 1.3 x 1.5 cm as well as at least 8 metastatic lesions within the supratentorial and infratentorial brain as outlined 3) 10/01/2019: PET CT scan revealed hypermetabolic infrahilar right lower lobe mass with hypermetabolic nodal metastases in the right hilum, mediastinum, right supraclavicular region, left axilla and lower left neck. 4) 10/08/2019: US  guided biopsy of the lymph nodes confirms poorly differentiated non-small cell carcinoma. Immunohistochemistry confirms adenocarcinoma of lung primary. PD-L1 and NGS later revealed a MET Exon 14 mutation and TPS of 90%.  5) 10/12/2019: Establish care with Dr. Federico  6) 11/04/2019: Day 1 of capmatinib 400mg  BID 7) 11/09/2019: presented to Rad/Onc with a DVT in LLE. Seen in symptom management clinic and started on apixaban .  8) 12/29/2019: CT C/A/P showed interval decrease in size of mass involving the superior segment of right lower lobe and reduction in mediastinal and left supraclavicular adenopathy though some small spinal lesions were noted in the interim between the two sets of  scans 9) 01/25/2020: temporarily stopped capmatinib due to symptoms of nausea/fatigue. Restarted therapy on 01/30/2020 after brief chemo holiday.  10) 03/22/2020: CT C/A/P reveals a mixed picture, with new lung nodule and increased lymphadenopathy, however response in bone lesions. D/c capmatinib therapy 11) 04/08/2020: start of Carbo/Pem/Pem for 2nd line treatment. Cycle 1 Day 1   12) 04/29/2020: Cycle 2 Day 1 Carbo/Pem/Pem  13) 05/20/2020: Cycle 3 Day 1 Carbo/Pem/Pem  14) 06/09/2020: Cycle 4 Day 1 Carbo/Pem/Pem  15) 06/29/2020: Cycle 5 Day 1 maintenance Pem/Pem  16) 07/20/2020: Cycle 6 Day 1 maintenance Pem/Pem  17) 08/12/2020: Cycle 7 Day 1 maintenance Pem/Pem  18) 09/01/2020: Cycle 8 Day 1 maintenance Pem/Pem  19) 09/21/2020: Cycle 9 Day 1 maintenance Pem/Pem  20) 10/13/2020: Cycle 10 Day 1 maintenance Pem/Pem  21) 11/03/2020: Cycle 11 Day 1 maintenance Pem/Pem  22) 11/25/2020:  Cycle 12 Day 1 maintenance Pem/Pem 23) 12/15/2020:  Cycle 13 Day 1 maintenance Pem/Pem 24) 01/05/2021:  Cycle 14 Day 1 maintenance Pem/Pem 25) 01/26/2021:  Cycle 15 Day 1 maintenance Pem/Pem plus Bevacizumab .  02/17/2021: Cycle 16 Day 1 maintenance Pem/Pem plus Bevacizumab .  03/10/2021: Cycle 17 Day 1 maintenance Pem/Pem plus Bevacizumab .  03/31/2021: Cycle 18 Day 1 maintenance Pem/Pem  04/20/2021:  Cycle 19 Day 1 maintenance Pem/Pem  05/12/2021: Cycle 20 Day 1 maintenance Pem/Pem 06/01/2021: Cycle 21 Day 1 maintenance Pem/Pem 06/22/2021: Cycle 22 Day 1 maintenance Pem/Pem 07/13/2021: Cycle 23 Day 1 maintenance Pem/Pem 08/16/2021: Cycle 24 Day 1 maintenance Pem/Pem 09/08/2021: Cycle 25 Day 1 maintenance Pem/Pem plus Bevacizumab  09/28/2021: Cycle 26 Day 1 maintenance Pem/Pem plus Bevacizumab  10/20/2021: Cycle 27 Day 1 maintenance Pem/Pem plus Bevacizumab  11/09/2021: Cycle 28 Day 1 maintenance Pem/Pem  12/06/2021: Cycle 29 Day 1 maintenance Pem/Pem  12/27/2021: Cycle  30 Day 1 maintenance Pem/Pem  01/18/2022: Cycle 31 Day 1 maintenance  Pem/Pem  02/07/2022: Cycle 32 Day 1 maintenance Pem/Pem  02/28/2022: Cycle 33 Day 1 maintenance Pem/Pem  03/23/2022: Cycle 34 Day 1 maintenance Pem/Pem  04/13/2022: Cycle 35 Day 1 maintenance Pem/Pem  05/04/2022: Cycle 36 Day 1 maintenance Pem/Pem  05/25/2022: Cycle 37 Day 1 maintenance Pem/Pem  06/14/2022: Cycle 38 Day 1 maintenance Pem/Pem 07/05/2022: Cycle 39 Day 1 maintenance Pem/Pem 08/13/2022: Cycle 40 Day 1 maintenance Pem/Pem 09/06/2022:Cycle 41 Day 1 maintenance Pem/Pem 09/27/2022: Cycle 42 Day 1 maintenance Pem/Pem 10/19/2022: Cycle 43 Day 1 maintenance Pem/Pem 11/09/2022: Cycle 44 Day 1 maintenance Pem/Pem 11/30/2022: Cycle 45 Day 1 maintenance Pem/Pem 12/20/2022: Cycle 46 Day 1 maintenance Pem/Pem 01/10/2023: Cycle 47 Day 1 maintenance Pem/Pem 01/31/2023:Cycle 48 Day 1 maintenance Pem/Pem 02/22/2023: Cycle 49 Day 1 maintenance Pem/Pem 03/15/2023: Cycle 50 Day 1 maintenance Pem/Pem 04/04/2023: Cycle 51 Day 1 maintenance Pem/Pem 04/26/2023: Cycle 52 Day 1 maintenance Pem/Pem 05/17/2023: Cycle 53 Day 1 maintenance Pem/Pem  Interval History:  Virginia [name] 68 y.o. female with medical history significant for metastatic adenocarcinoma of the lung presents for a follow up visit. The patient's last visit was on 09/02/2023.    On exam today Virginia [name] reports she had a wonderful holiday season.  She reports that she got to watch her granddaughter's pain to her husband's nails.  She reports her appetite has been good and her energy has also been pretty good.  She notes that she was on a Z-Pak for a sinus infection recently and feels like she is still having some drainage and a runny nose.  She reports she is not having much chest congestion.  She notes she is not having any nausea, vomiting, or diarrhea.  She reports her pain has sometimes increased up to 8 out of 10 in severity for which she is taking 2-4 oxycodone  per day.  She reports that she does have occasional headache which responds well to  Tylenol .  She has some occasional episodes of dizziness which she describes as a blizzard in her head.  She continues taking her Eliquis  with no bleeding, bruising, or dark stools.  She is still on dexamethasone  1 mg p.o. daily.  Overall she is willing and able to continue with chemotherapy at this time. She denies any fevers, chills, sweats or chest pain. She has no other complaints.  Full 10 point ROS was otherwise negative  MEDICAL HISTORY:  Past Medical History:  Diagnosis Date   Anemia    Anxiety    Concussion 09/28/2019   DVT (deep venous thrombosis) (HCC) 2021   L leg   Dyspnea    GERD (gastroesophageal reflux disease)    Hypercholesterolemia    per pt, she does not have elevated lipids   Hypertension    met lung ca dx'd 09/2019   mets to spine, hip and brain   PONV (postoperative nausea and vomiting)    Tobacco abuse     SURGICAL HISTORY: Past Surgical History:  Procedure Laterality Date   ABDOMINAL HYSTERECTOMY     partial/ left ovaries   APPLICATION OF CRANIAL NAVIGATION N/A 12/02/2020   Procedure: APPLICATION OF CRANIAL NAVIGATION;  Surgeon: [name], MD;  Location: [facility] OR;  Service: Neurosurgery;  Laterality: N/A;   CHOLECYSTECTOMY     CRANIOTOMY N/A 12/02/2020   Procedure: Posterior fossa craniotomy for tumor resection with brainlab;  Surgeon: [name], MD;  Location: [facility] OR;  Service: Neurosurgery;  Laterality: N/A;   DILATION  AND CURETTAGE OF UTERUS     IR IMAGING GUIDED PORT INSERTION  10/23/2019   IR PATIENT EVAL TECH 0-60 MINS  11/21/2021   IR PATIENT EVAL TECH 0-60 MINS  11/24/2021   IR PATIENT EVAL TECH 0-60 MINS  11/29/2021   IR PATIENT EVAL TECH 0-60 MINS  12/04/2021   IR PATIENT EVAL TECH 0-60 MINS  12/12/2021   IR PATIENT EVAL TECH 0-60 MINS  12/19/2021   IR PATIENT EVAL TECH 0-60 MINS  12/27/2021   IR PATIENT EVAL TECH 0-60 MINS  01/03/2022   IR PATIENT EVAL TECH 0-60 MINS  01/10/2022   IR PATIENT EVAL TECH 0-60 MINS  01/18/2022   IR PATIENT EVAL TECH  0-60 MINS  02/13/2022   IR PATIENT EVAL TECH 0-60 MINS  02/20/2022   IR RADIOLOGIST EVAL & MGMT  01/23/2022   IR REMOVAL TUN ACCESS W/ PORT W/O FL MOD SED  11/17/2021   KYPHOPLASTY N/A 03/15/2020   Procedure: Thoracic Eight KYPHOPLASTY;  Surgeon: [name], MD;  Location: [facility] OR;  Service: Neurosurgery;  Laterality: N/A;  prone    LIPOMA EXCISION  2018   removed under left breast and right thigh.   TUBAL LIGATION      ALLERGIES:  is allergic to scopolamine .  MEDICATIONS:  Current Outpatient Medications  Medication Sig Dispense Refill   acetaminophen  (TYLENOL ) 325 MG tablet Take 1-2 tablets (325-650 mg total) by mouth every 4 (four) hours as needed for mild pain (pain score 1-3).     albuterol  (VENTOLIN  HFA) 108 (90 Base) MCG/ACT inhaler INHALE 2 PUFFS INTO THE LUNGS EVERY 6 HOURS AS NEEDED FOR WHEEZING OR SHORTNESS OF BREATH (Patient taking differently: Inhale 2 puffs into the lungs every 6 (six) hours as needed for wheezing or shortness of breath.) 6.7 g 11   ALPRAZolam  (XANAX ) 0.25 MG tablet Take 1 tablet (0.25 mg total) by mouth 2 (two) times daily as needed for anxiety. 30 tablet 0   apixaban  (ELIQUIS ) 5 MG TABS tablet Take 1 tablet (5 mg total) by mouth 2 (two) times daily. 60 tablet 2   Cyanocobalamin  (VITAMIN B12 PO) Take 1 tablet by mouth daily. Gummies     dexamethasone  (DECADRON ) 1 MG tablet Take 1 tablet (1 mg total) by mouth daily. 90 tablet 0   feeding supplement (ENSURE ENLIVE / ENSURE PLUS) LIQD Take 237 mLs by mouth 2 (two) times daily between meals.     hydrocortisone  (ANUSOL -HC) 2.5 % rectal cream use in rectal area 2 to 3 times a day as needed (Patient taking differently: Place 1 Application rectally daily as needed for hemorrhoids.) 30 g PRN   levETIRAcetam  (KEPPRA ) 1000 MG tablet Take 1 tablet (1,000 mg total) by mouth 2 (two) times daily. 90 tablet 2   LYSINE PO Take 2 tablets by mouth daily.     magnesium  oxide (MAG-OX) 400 (240 Mg) MG tablet Take 1 tablet (400 mg total)  by mouth 2 (two) times daily. 60 tablet 0   meclizine  (ANTIVERT ) 50 MG tablet Take 1 tablet (50 mg total) by mouth 3 (three) times daily. 90 tablet 2   metoprolol  succinate (TOPROL -XL) 25 MG 24 hr tablet Take 1 tablet (25 mg total) by mouth daily. 90 tablet 3   Multiple Vitamin (MULTIVITAMIN WITH MINERALS) TABS tablet Take 1 tablet by mouth daily.     Nutritional Supplements (,FEEDING SUPPLEMENT, PROSOURCE PLUS) liquid Take 30 mLs by mouth 2 (two) times daily between meals.     OLANZapine  (ZYPREXA ) 2.5 MG tablet  Take 1 tablet (2.5 mg total) by mouth at bedtime. 90 tablet 1   ondansetron  (ZOFRAN ) 4 MG tablet TAKE 1 TABLET(4 MG) BY MOUTH EVERY 8 HOURS AS NEEDED FOR NAUSEA OR VOMITING (Patient taking differently: Take 4 mg by mouth every 8 (eight) hours as needed for nausea or vomiting.) 40 tablet 2   OVER THE COUNTER MEDICATION Take 2 tablets by mouth daily. Beet root     oxyCODONE  (OXY IR/ROXICODONE ) 5 MG immediate release tablet Take 1 tablet (5 mg total) by mouth every 4 (four) hours as needed for severe pain (pain score 7-10). 90 tablet 0   pantoprazole  (PROTONIX ) 40 MG tablet TAKE 1 TABLET(40 MG) BY MOUTH DAILY 90 tablet 3   polyethylene glycol (MIRALAX  / GLYCOLAX ) 17 g packet Take 17 g by mouth every evening.     potassium chloride  SA (KLOR-CON  M) 20 MEQ tablet Take 1 tablet (20 mEq total) by mouth 2 (two) times daily. 90 tablet 1   senna-docusate (SENOKOT-S) 8.6-50 MG tablet Take 2 tablets by mouth daily as needed (Patient preference, Sennakot S for mild constipation). (Patient taking differently: Take 2 tablets by mouth daily as needed for mild constipation.)     spironolactone  (ALDACTONE ) 25 MG tablet Take 1 tablet (25 mg total) by mouth daily. 90 tablet 3   VITAMIN D  PO Take 1 capsule by mouth every evening.     XTAMPZA  ER 13.5 MG C12A Take 1 capsule by mouth in the morning and at bedtime. 60 capsule 0   No current facility-administered medications for this visit.    REVIEW OF SYSTEMS:    Constitutional: ( - ) fevers, ( - )  chills , ( - ) night sweats Eyes: ( - ) blurriness of vision, ( - ) double vision, ( - ) watery eyes Ears, nose, mouth, throat, and face: ( - ) mucositis, ( - ) sore throat Respiratory: ( - ) cough, ( + ) dyspnea, ( - ) wheezes Cardiovascular: ( - ) palpitation, ( - ) chest discomfort, ( - ) lower extremity swelling Gastrointestinal:  ( + ) nausea, ( - ) heartburn, ( - ) change in bowel habits Skin: ( - ) abnormal skin rashes Lymphatics: ( - ) new lymphadenopathy, ( - ) easy bruising Neurological: ( - ) numbness, ( - ) tingling, ( - ) new weaknesses Behavioral/Psych: ( - ) mood change, ( - ) new changes  All other systems were reviewed with the patient and are negative.  PHYSICAL EXAMINATION: ECOG PERFORMANCE STATUS: 1 - Symptomatic but completely ambulatory  There were no vitals filed for this visit.      There were no vitals filed for this visit.    GENERAL: well appearing middle aged Caucasian female in NAD  SKIN: skin color, texture, turgor are normal, no rashes or significant lesions EYES: conjunctiva are pink and non-injected, sclera clear LUNGS: wheezing at lung bases. clear to auscultation and percussion with normal breathing effort HEART: regular rate & rhythm and no murmurs and bilateral trace pitting lower extremity edema Musculoskeletal: no cyanosis of digits and no clubbing PSYCH: alert & oriented x 3, fluent speech NEURO: no focal motor/sensory deficits  LABORATORY DATA:  I have reviewed the data as listed    Latest Ref Rng & Units 09/23/2023   10:46 AM 09/02/2023   12:44 PM 08/28/2023    4:43 AM  CBC  WBC 4.0 - 10.5 K/uL 11.5  12.7  12.6   Hemoglobin 12.0 - 15.0 g/dL 88.4  88.7  9.3   Hematocrit 36.0 - 46.0 % 34.6  34.0  27.8   Platelets 150 - 400 K/uL 215  579  304        Latest Ref Rng & Units 09/23/2023   10:46 AM 09/02/2023   12:44 PM 08/28/2023    4:43 AM  CMP  Glucose 70 - 99 mg/dL 883  891  870   BUN 8 -  23 mg/dL 51  50  31   Creatinine 0.44 - 1.00 mg/dL 8.75  8.75  8.65   Sodium 135 - 145 mmol/L 139  141  133   Potassium 3.5 - 5.1 mmol/L 4.3  5.1  3.6   Chloride 98 - 111 mmol/L 105  105  97   CO2 22 - 32 mmol/L 30  30  26    Calcium  8.9 - 10.3 mg/dL 9.4  89.3  7.7   Total Protein 6.5 - 8.1 g/dL 6.4  7.1    Total Bilirubin 0.0 - 1.2 mg/dL 0.2  0.2    Alkaline Phos 38 - 126 U/L 42  50    AST 15 - 41 U/L 13  12    ALT 0 - 44 U/L 11  12       RADIOGRAPHIC STUDIES: I personally have viewed the radiographic studies below: VAS US  LOWER EXTREMITY VENOUS (DVT) Result Date: 08/26/2023  Lower Venous DVT Study Patient Name:  Virginia [name]  Date of Exam:   08/26/2023 Medical Rec #: [id number]          Accession #:    [id number] Date of Birth: [birth year]         Patient Gender: F Patient Age:   31 years Exam Location:  [facility] Procedure:      VAS US  LOWER EXTREMITY VENOUS (DVT) Referring Phys: MAXIMINO SHARPS --------------------------------------------------------------------------------  Indications: Pulmonary embolism.  Risk Factors: Chemotherapy/metastatic cancer, hx of DVT. Limitations: Poor ultrasound/tissue interface. Comparison Study: Previous exam on 06/05/23 was positive for age indeterminate                   DVT in left PTV Performing Technologist: Ezzie Potters RVT, RDMS  Examination Guidelines: A complete evaluation includes B-mode imaging, spectral Doppler, color Doppler, and power Doppler as needed of all accessible portions of each vessel. Bilateral testing is considered an integral part of a complete examination. Limited examinations for reoccurring indications may be performed as noted. The reflux portion of the exam is performed with the patient in reverse Trendelenburg.  +---------+---------------+---------+-----------+----------+--------------+ RIGHT    CompressibilityPhasicitySpontaneityPropertiesThrombus Aging  +---------+---------------+---------+-----------+----------+--------------+ CFV      Full           Yes      Yes                                 +---------+---------------+---------+-----------+----------+--------------+ SFJ      Full                                                        +---------+---------------+---------+-----------+----------+--------------+ FV Prox  Full           Yes      Yes                                 +---------+---------------+---------+-----------+----------+--------------+  FV Mid   Full           Yes      Yes                                 +---------+---------------+---------+-----------+----------+--------------+ FV DistalFull           Yes      Yes                                 +---------+---------------+---------+-----------+----------+--------------+ PFV      Full                                                        +---------+---------------+---------+-----------+----------+--------------+ POP      Full           Yes      Yes                                 +---------+---------------+---------+-----------+----------+--------------+ PTV      Full                                                        +---------+---------------+---------+-----------+----------+--------------+ PERO     Full                                                        +---------+---------------+---------+-----------+----------+--------------+   +---------+---------------+---------+-----------+----------+-------------------+ LEFT     CompressibilityPhasicitySpontaneityPropertiesThrombus Aging      +---------+---------------+---------+-----------+----------+-------------------+ CFV      Full           Yes      Yes                                      +---------+---------------+---------+-----------+----------+-------------------+ SFJ      Full                                                              +---------+---------------+---------+-----------+----------+-------------------+ FV Prox  Full           Yes      Yes                                      +---------+---------------+---------+-----------+----------+-------------------+ FV Mid   Full           Yes      Yes                                      +---------+---------------+---------+-----------+----------+-------------------+  FV DistalFull           Yes      Yes                                      +---------+---------------+---------+-----------+----------+-------------------+ PFV      Full                                                             +---------+---------------+---------+-----------+----------+-------------------+ POP      Full           Yes      Yes                                      +---------+---------------+---------+-----------+----------+-------------------+ PTV      Full                                                             +---------+---------------+---------+-----------+----------+-------------------+ PERO     Full                                         Not well visualized +---------+---------------+---------+-----------+----------+-------------------+     Summary: BILATERAL: - No evidence of deep vein thrombosis seen in the lower extremities, bilaterally. -No evidence of popliteal cyst, bilaterally.   *See table(s) above for measurements and observations. Electronically signed by Fonda Rim on 08/26/2023 at 6:45:03 PM.    Final    CT Angio Chest PE W/Cm &/Or Wo Cm Result Date: 08/26/2023 CLINICAL DATA:  Pulmonary embolism, chest pain EXAM: CT ANGIOGRAPHY CHEST WITH CONTRAST TECHNIQUE: Multidetector CT imaging of the chest was performed using the standard protocol during bolus administration of intravenous contrast. Multiplanar CT image reconstructions and MIPs were obtained to evaluate the vascular anatomy. RADIATION DOSE REDUCTION: This exam was performed  according to the departmental dose-optimization program which includes automated exposure control, adjustment of the mA and/or kV according to patient size and/or use of iterative reconstruction technique. CONTRAST:  50mL OMNIPAQUE  IOHEXOL  350 MG/ML SOLN COMPARISON:  08/23/2023 FINDINGS: Cardiovascular: There is adequate opacification of the pulmonary arterial tree. Several small intraluminal filling defects are identified within the basal segmental pulmonary arteries of the right lower lobe in keeping with acute pulmonary emboli. The embolic burden is small. The central pulmonary arteries are of normal caliber. There is mild global cardiomegaly with left ventricular enlargement. No CT evidence of right heart strain. Mild coronary artery calcification. Small pericardial effusion has increased in size since prior examination. Moderate atherosclerotic calcification within the thoracic aorta. No aortic aneurysm. Mediastinum/Nodes: No enlarged mediastinal, hilar, or axillary lymph nodes. Thyroid  gland, trachea, and esophagus demonstrate no significant findings. Lungs/Pleura: Bibasilar atelectasis with lateral segmental collapse of the right lower lobe. Stable post treatment changes within the medial a right lower lobe abutting the pleural surface and extending into the azygoesophageal recess. A trace right  pleural effusion. No confluent pulmonary infiltrate. No pneumothorax. No central obstructing lesion. Upper Abdomen: No acute abnormality. Musculoskeletal: T8 vertebroplasty. No acute bone abnormality. Mixed lytic and sclerotic lesion within the mid body of the sternum suspicious for a solitary osseous metastasis. This appears new since more remote prior examination of 06/03/2023 Review of the MIP images confirms the above findings. IMPRESSION: 1. Acute pulmonary emboli within the basal segmental pulmonary arteries of the right lower lobe. The embolic burden is small. No CT evidence of right heart strain. 2. Trace  right pleural effusion, possibly reactive in nature. 3. Mild global cardiomegaly with left ventricular enlargement. Mild coronary artery calcification. 4. Small pericardial effusion, increased in size since prior examination. 5. Stable post treatment changes within the medial right lower lobe. 6. Mixed lytic and sclerotic lesion within the mid body of the sternum suspicious for a solitary osseous metastasis. This appears new since more remote prior examination of 06/03/2023. PET CT examination may be helpful for further evaluation. Aortic Atherosclerosis (ICD10-I70.0). Electronically Signed   By: Dorethia Molt M.D.   On: 08/26/2023 03:55   DG Chest Port 1 View Result Date: 08/26/2023 CLINICAL DATA:  Chest pain EXAM: PORTABLE CHEST 1 VIEW COMPARISON:  Radiographs 06/28/2023 and CT 08/23/2023 FINDINGS: Stable cardiomegaly. Aortic atherosclerotic calcification. Bibasilar atelectasis/scarring. Otherwise no focal consolidation. No pleural effusion or pneumothorax. Vertebroplasty of a midthoracic vertebral body. No displaced rib fractures. IMPRESSION: No active disease. Cardiomegaly. Electronically Signed   By: Norman Gatlin M.D.   On: 08/26/2023 01:26     ASSESSMENT & PLAN SHERIDEN ARCHIBEQUE is a 68 y.o. female with medical history significant for metastatic adenocarcinoma of the lung presents for a follow up visit. 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   Previously we discussed Carboplatin /Pembrolizumab /Pemetrexed  and the expected side effect from these medications.  We discussed the possible side effects of immunotherapy including colitis, hepatitis, dermatitis, and pneumonitis.  Additionally we discussed the side effects of carboplatin  which include suppression of blood counts, nephrotoxicity, and nausea.  Additionally we discussed pemetrexed  which can also have effects on counts and would require supplementation with vitamin B12 and folic acid .  She voiced her  understanding of these side effects and treatment plans moving forward.  I also noted that we would be able to drop the chemotherapy agents that she had intolerance to continue with pembrolizumab  alone given her high TPS score.  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 Due to worsening dizziness, she resumed this from Cycle 25-27.  --Most recent CT CAP from 02/16/2023 showed stable disease.  PLAN: --Due for Cycle 57, Day 1 of maintenance Pem/Pem today --Labs from today were reviewed and adequate for treatment.  Labs today show white blood cell count white blood cell 11.5, hemoglobin 11.5, MCV 97.5, platelets 215 --Proceed with treatment today but will hold the pemetrexed .  Will proceed with pembrolizumab  alone. --Last CT scan in Dec 2024 showed no evidence of progressive disease.  Repeat CT scan due March 2025. --RTC q 3 weeks for labs, follow up visit prior to Cycle 58.    #Appetite loss/weight loss: --Encouraged high protein diet and supplement with protein shakes --Advised to eat small, frequent meals.   #Brain Metastasis, stable --appreciate the assistance of Dr. Buckley in treating her brain  metastasis and symptoms. --radiation therapy to the brain completed on 11/16/2019.  --MRI brain on 04/29/2020 showed evidence of small brain bleed. Eliquis  therapy was discontinued.  --MRI on 11/04/2020 showed concern for progression of intracranial disease.  --On 12/02/2020, patient underwent craniotomy, resection of progressive cerebellar mass by Dr. Unice. 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  --MRI on 12/23/2020 showed evidence of radiation necrosis. Dr. Buckley evaluated the patient on 01/02/2021 with recommendations to add IV Bevacizumab  10 mg/kg q 3 weeks ( started 01/26/2021). Completed a  full course of this therapy. --Patient was evaluated by Dr. Buckley on 08/29/2021 and recommended to resume Bevacizumab  due to worsening dizziness.  Which was given from Cycle 25-27.  Plan: --continue to follow with Dr. Buckley  --Most recent MRI from 04/01/2023 showed increased enhancement. Dr. 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 Plan to repeat MRI brain on 08/19/2023.   #Nausea/Vomiting, worsening --Regimen includes Zofran  4-8mg  q8h PRN and compazine  10mg  q6H for breakthrough PRN, Olanzpaine 2.5 mg PO QHS to help with nausea.  She is currently using Phenergan  for breakthrough. --continue to monitor   #Hypokalemia --likely 2/2 to decreased PO intake, hypovolemia, and BP medications --Potassium level 4.6 today.   --Currently on potassium chloride  tablets, 40 mEq PO potassium in AM and 20mEq in PM.   #Pain Control, stable --continue oxycodone  5mg  q4H PRN. Can increase dose to 10mg  q6H if pain is severe. She is taking approximately 3-4 pills per day.  --continue Xtampza  13.5 mg ER BID for long acting support.  --continue to take with Senokot to prevent opioid induced constipation. 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 Next dose due today.    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    # Hypertension: --Continue Norvasc  to 10 mg daily.  --continue to monitor at home and in clinic.    No orders of the defined types were placed in this encounter.   All questions were answered. The patient knows to call the clinic with any problems, questions or concerns.  I have spent a total of 30 minutes minutes of face-to-face and non-face-to-face time, preparing to see the patient,  performing a medically appropriate examination, counseling and educating the patient,  documenting clinical information in the electronic health record, and care coordination.   Norleen IVAR Kidney, MD Department of Hematology/Oncology [facility] at [facility] Phone: [phone] Pager: [phone] Email: [email or web address]  09/24/2023 2:40 PM   Literature Support:  Lucienne CROME, Rodrguez-Abreu D, Gadgeel S, Esteban E, Felip E, De Angelis F, Domine M, Clingan P, Hochmair MJ, [place], Cheng SY, Bischoff HG, Peled N, Grossi F, Jennens RR, Reck M, Benson, [place], [place], Rubio-Viqueira B, Novello S, Kurata T, Gray JE, Vida J, Wei Z, Yang J, Raftopoulos H, [place], [place] [employer]; [id number] Investigators. Pembrolizumab  plus Chemotherapy in Metastatic Non-Small-Cell Lung Cancer. LOISE Diedra PARAS Med. 2018 May 31;378(22):2078-2092.  --Median progression-free survival was 8.8 months (95% CI, 7.6 to 9.2) in the pembrolizumab -combination group and 4.9 months (95% CI, 4.7 to 5.5) in the placebo-combination group (hazard ratio for disease progression or death, 0.52; 95% CI, 0.43 to 0.64; P<0.001). Adverse events of grade 3 or higher occurred in 67.2% of the patients in the pembrolizumab -combination  group and in 65.8% of those in the placebo-combination group.  Missy KATHEE Diona LITTIE Cesario OLEGARIO Patt L. Efficacy of pemetrexed -based regimens in advanced non-small cell lung cancer patients with activating epidermal growth factor receptor mutations after tyrosine kinase inhibitor failure: a systematic review. Onco Targets Ther. 2018;11:2121-2129.   --The weighted median PFS, median OS, and ORR for patients treated with pem regimens were 5.09 months, 15.91 months, and 30.19%, respectively. Our systematic review results showed a favorable efficacy profile of pem regimens in NSCLC patients with EGFR mutation after EGFR-TKI failure.

## 2023-09-23 NOTE — Patient Instructions (Signed)
 [facility] - A DEPT OF [facility]  Discharge Instructions: Thank you for choosing [facility] to provide your oncology and hematology care.   If you have a lab appointment with the Cancer Center, please go directly to the Cancer Center and check in at the registration area.   Wear comfortable clothing and clothing appropriate for easy access to any Portacath or PICC line.   We strive to give you quality time with your provider. You may need to reschedule your appointment if you arrive late (15 or more minutes).  Arriving late affects you and other patients whose appointments are after yours.  Also, if you miss three or more appointments without notifying the office, you may be dismissed from the clinic at the provider's discretion.      For prescription refill requests, have your pharmacy contact our office and allow 72 hours for refills to be completed.    Today you received the following chemotherapy and/or immunotherapy agents: Keytruda       To help prevent nausea and vomiting after your treatment, we encourage you to take your nausea medication as directed.  BELOW ARE SYMPTOMS THAT SHOULD BE REPORTED IMMEDIATELY: *FEVER GREATER THAN 100.4 F (38 C) OR HIGHER *CHILLS OR SWEATING *NAUSEA AND VOMITING THAT IS NOT CONTROLLED WITH YOUR NAUSEA MEDICATION *UNUSUAL SHORTNESS OF BREATH *UNUSUAL BRUISING OR BLEEDING *URINARY PROBLEMS (pain or burning when urinating, or frequent urination) *BOWEL PROBLEMS (unusual diarrhea, constipation, pain near the anus) TENDERNESS IN MOUTH AND THROAT WITH OR WITHOUT PRESENCE OF ULCERS (sore throat, sores in mouth, or a toothache) UNUSUAL RASH, SWELLING OR PAIN  UNUSUAL VAGINAL DISCHARGE OR ITCHING   Items with * indicate a potential emergency and should be followed up as soon as possible or go to the Emergency Department if any problems should occur.  Please show the CHEMOTHERAPY ALERT CARD or IMMUNOTHERAPY  ALERT CARD at check-in to the Emergency Department and triage nurse.  Should you have questions after your visit or need to cancel or reschedule your appointment, please contact [facility] A DE[facility]  Dept: [phone]  and follow the prompts.  Office hours are 8:00 a.m. to 4:30 p.m. Monday - Friday. Please note that voicemails left after 4:00 p.m. may not be returned until the following business day.  We are closed weekends and major holidays. You have access to a nurse at all times for urgent questions. Please call the main number to the clinic Dept: [phone] and follow the prompts.   For any non-urgent questions, you may also contact your provider using MyChart. We now offer e-Visits for anyone 74 and older to request care online for non-urgent symptoms. For details visit [email or web address].   Also download the MyChart app! Go to the app store, search MyChart, open the app, select [facility], and log in with your MyChart username and password.

## 2023-09-23 NOTE — Progress Notes (Signed)
 Palliative Medicine [facility]  Telephone:(336) [phone] Fax:(336) [phone]   Name: Virginia [name] Date: 09/23/2023 MRN: [id number]  DOB: December 12, 1955  Patient Care Team: Perri Ronal PARAS, MD as PCP - General (Internal Medicine) Federico Norleen ONEIDA MADISON, MD as PCP - Hematology/Oncology (Hematology and Oncology) Evertt Lonell BROCKS, RN as Oncology Nurse Navigator Marvine Almarie CROME, DO as Consulting Physician [facility] and Palliative Medicine) Lbcardiology, Rande, MD as Rounding Team (Cardiology)    REASON FOR CONSULTATION: Virginia [name] is a 68 y.o. female with oncologic medical history including non-small cell lung cancer (09/2019) with metastatic disease to the bone and brain.  Palliative ask to see for symptom management and goals of care.    SOCIAL HISTORY:     reports that she quit smoking about 10 years ago. Her smoking use included cigarettes. She has never used smokeless tobacco. She reports current alcohol use. She reports that she does not use drugs.  ADVANCE DIRECTIVES:  Advanced directives on file naming Virginia [name] and Virginia [name] as the primary and secondary decision makers should the patient become unable to speak for them selves.   CODE STATUS: Full code  PAST MEDICAL HISTORY: Past Medical History:  Diagnosis Date   Anemia    Anxiety    Concussion 09/28/2019   DVT (deep venous thrombosis) (HCC) 2021   L leg   Dyspnea    GERD (gastroesophageal reflux disease)    Hypercholesterolemia    per pt, she does not have elevated lipids   Hypertension    met lung ca dx'd 09/2019   mets to spine, hip and brain   PONV (postoperative nausea and vomiting)    Tobacco abuse     PAST SURGICAL HISTORY:  Past Surgical History:  Procedure Laterality Date   ABDOMINAL HYSTERECTOMY     partial/ left ovaries   APPLICATION OF CRANIAL NAVIGATION N/A 12/02/2020   Procedure: APPLICATION OF CRANIAL NAVIGATION;  Surgeon: [name], MD;  Location: [facility] OR;   Service: Neurosurgery;  Laterality: N/A;   CHOLECYSTECTOMY     CRANIOTOMY N/A 12/02/2020   Procedure: Posterior fossa craniotomy for tumor resection with brainlab;  Surgeon: [name], MD;  Location: [facility] OR;  Service: Neurosurgery;  Laterality: N/A;   DILATION AND CURETTAGE OF UTERUS     IR IMAGING GUIDED PORT INSERTION  10/23/2019   IR PATIENT EVAL TECH 0-60 MINS  11/21/2021   IR PATIENT EVAL TECH 0-60 MINS  11/24/2021   IR PATIENT EVAL TECH 0-60 MINS  11/29/2021   IR PATIENT EVAL TECH 0-60 MINS  12/04/2021   IR PATIENT EVAL TECH 0-60 MINS  12/12/2021   IR PATIENT EVAL TECH 0-60 MINS  12/19/2021   IR PATIENT EVAL TECH 0-60 MINS  12/27/2021   IR PATIENT EVAL TECH 0-60 MINS  01/03/2022   IR PATIENT EVAL TECH 0-60 MINS  01/10/2022   IR PATIENT EVAL TECH 0-60 MINS  01/18/2022   IR PATIENT EVAL TECH 0-60 MINS  02/13/2022   IR PATIENT EVAL TECH 0-60 MINS  02/20/2022   IR RADIOLOGIST EVAL & MGMT  01/23/2022   IR REMOVAL TUN ACCESS W/ PORT W/O FL MOD SED  11/17/2021   KYPHOPLASTY N/A 03/15/2020   Procedure: Thoracic Eight KYPHOPLASTY;  Surgeon: [name], MD;  Location: [facility] OR;  Service: Neurosurgery;  Laterality: N/A;  prone    LIPOMA EXCISION  2018   removed under left breast and right thigh.   TUBAL LIGATION      HEMATOLOGY/ONCOLOGY  HISTORY:  Oncology History  Malignant neoplasm metastatic to brain (HCC)  10/15/2019 Initial Diagnosis   Brain metastases (HCC)   04/08/2020 - 05/03/2022 Chemotherapy   Patient is on Treatment Plan : LUNG Carboplatin  + Pemetrexed  + Pembrolizumab  q21d Induction      04/08/2020 -  Chemotherapy   Patient is on Treatment Plan : LUNG Carboplatin  (5) + Pemetrexed  (500) + Pembrolizumab  (200) D1 q21d Induction x 4 cycles / Maintenance Pemetrexed  (500) + Pembrolizumab  (200) D1 q21d     Malignant neoplasm metastatic to bone (HCC)    ALLERGIES:  is allergic to scopolamine .  MEDICATIONS:  Current Outpatient Medications  Medication Sig Dispense Refill   acetaminophen  (TYLENOL )  325 MG tablet Take 1-2 tablets (325-650 mg total) by mouth every 4 (four) hours as needed for mild pain (pain score 1-3).     albuterol  (VENTOLIN  HFA) 108 (90 Base) MCG/ACT inhaler INHALE 2 PUFFS INTO THE LUNGS EVERY 6 HOURS AS NEEDED FOR WHEEZING OR SHORTNESS OF BREATH (Patient taking differently: Inhale 2 puffs into the lungs every 6 (six) hours as needed for wheezing or shortness of breath.) 6.7 g 11   ALPRAZolam  (XANAX ) 0.25 MG tablet Take 1 tablet (0.25 mg total) by mouth 2 (two) times daily as needed for anxiety. 30 tablet 0   apixaban  (ELIQUIS ) 5 MG TABS tablet Take 1 tablet (5 mg total) by mouth 2 (two) times daily. 60 tablet 2   Cyanocobalamin  (VITAMIN B12 PO) Take 1 tablet by mouth daily. Gummies     dexamethasone  (DECADRON ) 1 MG tablet Take 1 tablet (1 mg total) by mouth daily. 90 tablet 0   feeding supplement (ENSURE ENLIVE / ENSURE PLUS) LIQD Take 237 mLs by mouth 2 (two) times daily between meals.     hydrocortisone  (ANUSOL -HC) 2.5 % rectal cream use in rectal area 2 to 3 times a day as needed (Patient taking differently: Place 1 Application rectally daily as needed for hemorrhoids.) 30 g PRN   levETIRAcetam  (KEPPRA ) 1000 MG tablet Take 1 tablet (1,000 mg total) by mouth 2 (two) times daily. 90 tablet 2   LYSINE PO Take 2 tablets by mouth daily.     magnesium  oxide (MAG-OX) 400 (240 Mg) MG tablet Take 1 tablet (400 mg total) by mouth 2 (two) times daily. 60 tablet 0   meclizine  (ANTIVERT ) 50 MG tablet Take 1 tablet (50 mg total) by mouth 3 (three) times daily. 90 tablet 2   metoprolol  succinate (TOPROL -XL) 25 MG 24 hr tablet Take 1 tablet (25 mg total) by mouth daily. 90 tablet 3   Multiple Vitamin (MULTIVITAMIN WITH MINERALS) TABS tablet Take 1 tablet by mouth daily.     Nutritional Supplements (,FEEDING SUPPLEMENT, PROSOURCE PLUS) liquid Take 30 mLs by mouth 2 (two) times daily between meals.     OLANZapine  (ZYPREXA ) 2.5 MG tablet Take 1 tablet (2.5 mg total) by mouth at bedtime. 90  tablet 1   ondansetron  (ZOFRAN ) 4 MG tablet TAKE 1 TABLET(4 MG) BY MOUTH EVERY 8 HOURS AS NEEDED FOR NAUSEA OR VOMITING (Patient taking differently: Take 4 mg by mouth every 8 (eight) hours as needed for nausea or vomiting.) 40 tablet 2   OVER THE COUNTER MEDICATION Take 2 tablets by mouth daily. Beet root     oxyCODONE  (OXY IR/ROXICODONE ) 5 MG immediate release tablet Take 1 tablet (5 mg total) by mouth every 4 (four) hours as needed for severe pain (pain score 7-10). 90 tablet 0   pantoprazole  (PROTONIX ) 40 MG tablet TAKE 1 TABLET(40  MG) BY MOUTH DAILY 90 tablet 3   polyethylene glycol (MIRALAX  / GLYCOLAX ) 17 g packet Take 17 g by mouth every evening.     potassium chloride  SA (KLOR-CON  M) 20 MEQ tablet Take 1 tablet (20 mEq total) by mouth 2 (two) times daily. 90 tablet 1   senna-docusate (SENOKOT-S) 8.6-50 MG tablet Take 2 tablets by mouth daily as needed (Patient preference, Sennakot S for mild constipation). (Patient taking differently: Take 2 tablets by mouth daily as needed for mild constipation.)     spironolactone  (ALDACTONE ) 25 MG tablet Take 1 tablet (25 mg total) by mouth daily. 90 tablet 3   VITAMIN D  PO Take 1 capsule by mouth every evening.     XTAMPZA  ER 13.5 MG C12A Take 1 capsule by mouth in the morning and at bedtime. (every 12 hours) 60 capsule 0   No current facility-administered medications for this visit.    VITAL SIGNS: There were no vitals taken for this visit. There were no vitals filed for this visit.  Estimated body mass index is 26.89 kg/m as calculated from the following:   Height as of 09/13/23: 5' 2 (1.575 m).   Weight as of 09/13/23: 147 lb (66.7 kg).  LABS: CBC:    Component Value Date/Time   WBC 12.7 (H) 09/02/2023 1244   WBC 12.6 (H) 08/28/2023 0443   HGB 11.2 (L) 09/02/2023 1244   HCT 34.0 (L) 09/02/2023 1244   PLT 579 (H) 09/02/2023 1244   MCV 98.6 09/02/2023 1244   NEUTROABS 7.8 (H) 09/02/2023 1244   LYMPHSABS 2.5 09/02/2023 1244   MONOABS 1.4  (H) 09/02/2023 1244   EOSABS 0.0 09/02/2023 1244   BASOSABS 0.1 09/02/2023 1244   Comprehensive Metabolic Panel:    Component Value Date/Time   NA 141 09/02/2023 1244   K 5.1 09/02/2023 1244   CL 105 09/02/2023 1244   CO2 30 09/02/2023 1244   BUN 50 (H) 09/02/2023 1244   CREATININE 1.24 (H) 09/02/2023 1244   CREATININE 0.58 11/15/2017 0948   GLUCOSE 108 (H) 09/02/2023 1244   CALCIUM  10.6 (H) 09/02/2023 1244   AST 12 (L) 09/02/2023 1244   ALT 12 09/02/2023 1244   ALKPHOS 50 09/02/2023 1244   BILITOT 0.2 09/02/2023 1244   PROT 7.1 09/02/2023 1244   ALBUMIN 3.3 (L) 09/02/2023 1244    RADIOGRAPHIC STUDIES: CT Angio Chest PE W/Cm &/Or Wo Cm Result Date: 08/26/2023 CLINICAL DATA:  Pulmonary embolism, chest pain EXAM: CT ANGIOGRAPHY CHEST WITH CONTRAST TECHNIQUE: Multidetector CT imaging of the chest was performed using the standard protocol during bolus administration of intravenous contrast. Multiplanar CT image reconstructions and MIPs were obtained to evaluate the vascular anatomy. RADIATION DOSE REDUCTION: This exam was performed according to the departmental dose-optimization program which includes automated exposure control, adjustment of the mA and/or kV according to patient size and/or use of iterative reconstruction technique. CONTRAST:  50mL OMNIPAQUE  IOHEXOL  350 MG/ML SOLN COMPARISON:  08/23/2023 FINDINGS: Cardiovascular: There is adequate opacification of the pulmonary arterial tree. Several small intraluminal filling defects are identified within the basal segmental pulmonary arteries of the right lower lobe in keeping with acute pulmonary emboli. The embolic burden is small. The central pulmonary arteries are of normal caliber. There is mild global cardiomegaly with left ventricular enlargement. No CT evidence of right heart strain. Mild coronary artery calcification. Small pericardial effusion has increased in size since prior examination. Moderate atherosclerotic calcification  within the thoracic aorta. No aortic aneurysm. Mediastinum/Nodes: No enlarged mediastinal, hilar, or  axillary lymph nodes. Thyroid  gland, trachea, and esophagus demonstrate no significant findings. Lungs/Pleura: Bibasilar atelectasis with lateral segmental collapse of the right lower lobe. Stable post treatment changes within the medial a right lower lobe abutting the pleural surface and extending into the azygoesophageal recess. A trace right pleural effusion. No confluent pulmonary infiltrate. No pneumothorax. No central obstructing lesion. Upper Abdomen: No acute abnormality. Musculoskeletal: T8 vertebroplasty. No acute bone abnormality. Mixed lytic and sclerotic lesion within the mid body of the sternum suspicious for a solitary osseous metastasis. This appears new since more remote prior examination of 06/03/2023 Review of the MIP images confirms the above findings. IMPRESSION: 1. Acute pulmonary emboli within the basal segmental pulmonary arteries of the right lower lobe. The embolic burden is small. No CT evidence of right heart strain. 2. Trace right pleural effusion, possibly reactive in nature. 3. Mild global cardiomegaly with left ventricular enlargement. Mild coronary artery calcification. 4. Small pericardial effusion, increased in size since prior examination. 5. Stable post treatment changes within the medial right lower lobe. 6. Mixed lytic and sclerotic lesion within the mid body of the sternum suspicious for a solitary osseous metastasis. This appears new since more remote prior examination of 06/03/2023. PET CT examination may be helpful for further evaluation. Aortic Atherosclerosis (ICD10-I70.0). Electronically Signed   By: Dorethia Molt M.D.   On: 08/26/2023 03:55   DG Chest Port 1 View Result Date: 08/26/2023 CLINICAL DATA:  Chest pain EXAM: PORTABLE CHEST 1 VIEW COMPARISON:  Radiographs 06/28/2023 and CT 08/23/2023 FINDINGS: Stable cardiomegaly. Aortic atherosclerotic calcification.  Bibasilar atelectasis/scarring. Otherwise no focal consolidation. No pleural effusion or pneumothorax. Vertebroplasty of a midthoracic vertebral body. No displaced rib fractures. IMPRESSION: No active disease. Cardiomegaly. Electronically Signed   By: Norman Gatlin M.D.   On: 08/26/2023 01:26    PERFORMANCE STATUS (ECOG) : 2 - Symptomatic, <50% confined to bed  Review of Systems  Constitutional:  Positive for activity change and fatigue.  Musculoskeletal:  Positive for arthralgias and back pain.  Unless otherwise noted, a complete review of systems is negative.  Physical Exam General: NAD Cardiovascular: regular rate and rhythm Pulmonary: clear ant fields Abdomen: soft, nontender, + bowel sounds Extremities: no edema, no joint deformities Skin: no rashes Neurological: Alert and oriented x3  IMPRESSION:  This is my initial visit with Virginia [name]. She was initially seen by our team during recent hospitalization. No acute distress. Patient is in a wheelchair. Her husband is present. She is alert and able to engage appropriately in discussions.   I introduced myself, Maygan RN, and Palliative's role in collaboration with the oncology team. Concept of Palliative Care was introduced as specialized medical care for people and their families living with serious illness.  It focuses on providing relief from the symptoms and stress of a serious illness.  The goal is to improve quality of life for both the patient and the family. Values and goals of care important to patient and family were attempted to be elicited.   Virginia [name] lives in the home with her husband of more than 35 years. They have 5 children and 10 grandchildren. 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   She reports difficulty with mobility, being able to move independently only to the bathroom with the aid of a wheelchair and a walker. The patient's home has been adapted to accommodate her  limited mobility, with the dining room converted into a bedroom for easier access to the bathroom.  Her husband purchased her a hospital bed to assist in her comfort and care. The patient's appetite is reported as good, possibly due to daily steroid use. The patient's bowel movements are regular, managed with Senokot twice daily and Miralax  as needed.  Virginia [name] complains of back pain, which she rates as an 8 out of 10 when not medicated. The pain is managed with Xtampza  9mg  and oxycodone  5 mg as needed which she reports requiring at least twice daily, once at night before sleep and once in the morning. The patient reports an increase in pain medication usage, averaging about 3-4 pills per day, with a maximum of 5 pills on some days. Her pain is located in the lower back, a site previously treated with radiation for cancer. Pain decreases to 2-3 out of 10 with medication. Patient reports regimen is controlling her pain. No adjustments at this time. We will continue to closely monitor.   Virginia [name] general condition seems to be stable, with no new symptoms or complaints reported. The patient's social situation appears supportive, with a spouse actively involved in her care. The patient engages in leisure activities such as watching television series and spending time with family.  I discussed the importance of continued conversation with family and their medical providers regarding overall plan of care and treatment options, ensuring decisions are within the context of the patients values and GOCs.  PLAN: Established therapeutic relationship. Education provided on palliative's role in collaboration with their Oncology/Radiation team.  Cancer-related pain Reports back pain, likely secondary to compression fracture and cancer. Pain is managed with Oxycodone  and Xtampza , but inconsistently requires breakthrough medication. No changes to her regimen as she feels this is controlling her pain sufficiently.   -Continue Oxycodone  5mg  every 4-6 hours as needed for breakthrough pain.  -Refill Xtampza  9mg  for 30-day supply. -Encourage patient to communicate when nearing end of supply for timely refills.  Limited mobility Patient is primarily bed-bound, able to move to bathroom with assistance and wheelchair. -Continue current home setup and assistance. -Continue physical therapy visits twice a week.  Constipation Managed with Senokot twice daily and Miralax  as needed. -Continue current regimen.  Increased appetite Likely secondary to daily steroid use. -Continue current regimen, monitor for excessive weight gain.  General Health Maintenance / Followup Plans -Continue with scheduled oncology appointments and infusions. -Address scheduling issue with upcoming appointments. -Encourage use of MyChart for communication and medication refill requests. -I will plan to see patient back in 3-4 weeks. She and husband know to contact office sooner if needed.   Patient expressed understanding and was in agreement with this plan. She also understands that She can call the clinic at any time with any questions, concerns, or complaints.   Thank you for your referral and allowing Palliative to assist in Virginia [name]'s care.   Number and complexity of problems addressed: HIGH - 1 or more chronic illnesses with SEVERE exacerbation, progression, or side effects of treatment - advanced cancer, pain. Any controlled substances utilized were prescribed in the context of palliative care.   Visit consisted of counseling and education dealing with the complex and emotionally intense issues of symptom management and palliative care in the setting of serious and potentially life-threatening illness.  Signed by: Levon Borer, AGPCNP-BC Palliative Medicine Team/[facility]

## 2023-09-24 ENCOUNTER — Other Ambulatory Visit: Payer: Medicare Other

## 2023-09-24 ENCOUNTER — Encounter: Payer: Self-pay | Admitting: Hematology and Oncology

## 2023-09-24 ENCOUNTER — Telehealth: Payer: Self-pay | Admitting: Internal Medicine

## 2023-09-24 ENCOUNTER — Inpatient Hospital Stay: Payer: Medicare Other | Admitting: Hematology and Oncology

## 2023-09-24 ENCOUNTER — Inpatient Hospital Stay: Payer: Medicare Other

## 2023-09-24 DIAGNOSIS — Z8674 Personal history of sudden cardiac arrest: Secondary | ICD-10-CM | POA: Diagnosis not present

## 2023-09-24 DIAGNOSIS — I2699 Other pulmonary embolism without acute cor pulmonale: Secondary | ICD-10-CM | POA: Diagnosis not present

## 2023-09-24 DIAGNOSIS — J9601 Acute respiratory failure with hypoxia: Secondary | ICD-10-CM

## 2023-09-24 DIAGNOSIS — Z515 Encounter for palliative care: Secondary | ICD-10-CM | POA: Diagnosis not present

## 2023-09-24 DIAGNOSIS — Z9181 History of falling: Secondary | ICD-10-CM | POA: Diagnosis not present

## 2023-09-24 NOTE — Telephone Encounter (Signed)
-------  Fax Transmission Report-------  To:               Recipient at [phone] Subject:          Fw: Hp Scans Result:           The transmission was successful. Explanation:      All Pages Ok Pages Sent:       8 Connect Time:     5 minutes, 17 seconds Transmit Time:    09/24/2023 16:02 Transfer Rate:    14400 Status Code:      0000 Retry Count:      0 Job Id:           [id number] Unique Id:        [id number] Fax Line:         38 Fax Server:       [id number]

## 2023-09-24 NOTE — Telephone Encounter (Signed)
 Received order to re-certify patient from [facility] [phone], They were signed and faxed back  Order # [id number] Certification period 12/30/20244 to 11/07/2023

## 2023-09-27 ENCOUNTER — Other Ambulatory Visit: Payer: Self-pay | Admitting: Nurse Practitioner

## 2023-09-27 DIAGNOSIS — C3490 Malignant neoplasm of unspecified part of unspecified bronchus or lung: Secondary | ICD-10-CM

## 2023-09-27 DIAGNOSIS — C7951 Secondary malignant neoplasm of bone: Secondary | ICD-10-CM

## 2023-09-27 DIAGNOSIS — Z515 Encounter for palliative care: Secondary | ICD-10-CM

## 2023-09-27 DIAGNOSIS — G893 Neoplasm related pain (acute) (chronic): Secondary | ICD-10-CM

## 2023-10-08 ENCOUNTER — Ambulatory Visit
Admission: RE | Admit: 2023-10-08 | Discharge: 2023-10-08 | Disposition: A | Discharge status: Home / Self Care | Payer: Medicare Other | Source: Ambulatory Visit | Attending: Internal Medicine

## 2023-10-08 DIAGNOSIS — C7931 Secondary malignant neoplasm of brain: Secondary | ICD-10-CM

## 2023-10-08 MED ORDER — GADOPICLENOL 0.5 MMOL/ML IV SOLN
6.5000 mL | Freq: Once | INTRAVENOUS | Status: AC | PRN
  Administered 2023-10-08: 6.5 mL via INTRAVENOUS

## 2023-10-09 ENCOUNTER — Telehealth: Payer: Self-pay | Admitting: *Deleted

## 2023-10-09 NOTE — Telephone Encounter (Signed)
Received call from pt's husband regarding pt's home health services. Harvie Heck states he got a call from the agency (or the home health aide) saying she was no longer a patient with them. Advised that I would call [facility] to see what I could find out.  Spoke to [facility] and explained the situation. She states that Ms. Huitron is still active with them. Asked her to reach to Harvie Heck to confirm their ongoing care of [name].  Amber stated she would call the patient's husband.  Notified husband to expect a call from [facility] @ [facility].

## 2023-10-10 ENCOUNTER — Telehealth: Payer: Self-pay | Admitting: Internal Medicine

## 2023-10-10 NOTE — Telephone Encounter (Signed)
Copied from CRM [id number]. Topic: Referral - Request for Referral >> Oct 09, 2023  2:49 PM Elle L wrote: Did the patient discuss referral with their provider in the last year? Yes  Appointment offered? Yes  Type of order/referral and detailed reason for visit: When the patient was dismissed from the hospital they had [facility] come two times a week to bathe the patient. The patient's husband states their orders had ended and was requesting to see if Dr. Lenord Fellers can order them. I did schedule a virtual appointment for the patient. However, at his request I am sending a request beforehand as well incase the services can begin sooner.   Preference of office, provider, location: [facility]   If referral order, have you been seen by this specialty before? Yes for the same issue.   Can we respond through MyChart? No

## 2023-10-10 NOTE — Telephone Encounter (Signed)
Called Harvie Heck and every thing has been taking care of.

## 2023-10-11 ENCOUNTER — Telehealth: Payer: Self-pay | Admitting: Internal Medicine

## 2023-10-11 ENCOUNTER — Ambulatory Visit: Payer: Medicare Other | Admitting: Internal Medicine

## 2023-10-11 NOTE — Telephone Encounter (Signed)
Received order #[id number] to sign and fax back to [facility] [phone], [phone], phone [phone] to continue home health orders  PT effective 10/20/2023 1wk3 HHA effective 10/06/2023 1wh5

## 2023-10-11 NOTE — Progress Notes (Signed)
 Palliative Medicine [facility]  [phone] [phone]   Name: Virginia [name] Date: 10/11/2023 MRN: [id number]  DOB: 09/16/1955  Patient Care Team: Perri Ronal PARAS, MD as PCP - General (Internal Medicine) Federico Norleen ONEIDA MADISON, MD as PCP - Hematology/Oncology (Hematology and Oncology) Evertt Lonell BROCKS, RN as Oncology Nurse Navigator Marvine Almarie CROME, DO as Consulting Physician [facility] and Palliative Medicine) Lbcardiology, Rande, MD as Rounding Team (Cardiology) Pickenpack-Cousar, Fannie SAILOR, NP as Nurse Practitioner (Hospice and Palliative Medicine)    INTERVAL HISTORY: Virginia [name] is a 68 y.o. female with oncologic medical history including non-small cell lung cancer (09/2019) with metastatic disease to the bone and brain.  Palliative ask to see for symptom management and goals of care.   SOCIAL HISTORY:     reports that she quit smoking about 10 years ago. Her smoking use included cigarettes. She has never used smokeless tobacco. She reports current alcohol use. She reports that she does not use drugs.  ADVANCE DIRECTIVES:  Advanced directives on file naming Tsering Leaman and Rosina Horns as the primary and secondary decision makers should the patient become unable to speak for them selves.   CODE STATUS: Full code  PAST MEDICAL HISTORY: Past Medical History:  Diagnosis Date   Anemia    Anxiety    Concussion 09/28/2019   DVT (deep venous thrombosis) (HCC) 2021   L leg   Dyspnea    GERD (gastroesophageal reflux disease)    Hypercholesterolemia    per pt, she does not have elevated lipids   Hypertension    met lung ca dx'd 09/2019   mets to spine, hip and brain   PONV (postoperative nausea and vomiting)    Tobacco abuse     ALLERGIES:  is allergic to scopolamine .  MEDICATIONS:  Current Outpatient Medications  Medication Sig Dispense Refill   acetaminophen  (TYLENOL ) 325 MG tablet Take 1-2 tablets (325-650 mg  total) by mouth every 4 (four) hours as needed for mild pain (pain score 1-3).     albuterol  (VENTOLIN  HFA) 108 (90 Base) MCG/ACT inhaler INHALE 2 PUFFS INTO THE LUNGS EVERY 6 HOURS AS NEEDED FOR WHEEZING OR SHORTNESS OF BREATH (Patient taking differently: Inhale 2 puffs into the lungs every 6 (six) hours as needed for wheezing or shortness of breath.) 6.7 g 11   ALPRAZolam  (XANAX ) 0.25 MG tablet Take 1 tablet (0.25 mg total) by mouth 2 (two) times daily as needed for anxiety. 30 tablet 0   apixaban  (ELIQUIS ) 5 MG TABS tablet Take 1 tablet (5 mg total) by mouth 2 (two) times daily. 60 tablet 2   Cyanocobalamin  (VITAMIN B12 PO) Take 1 tablet by mouth daily. Gummies     dexamethasone  (DECADRON ) 1 MG tablet Take 1 tablet (1 mg total) by mouth daily. 90 tablet 0   feeding supplement (ENSURE ENLIVE / ENSURE PLUS) LIQD Take 237 mLs by mouth 2 (two) times daily between meals.     hydrocortisone  (ANUSOL -HC) 2.5 % rectal cream use in rectal area 2 to 3 times a day as needed (Patient taking differently: Place 1 Application rectally daily as needed for hemorrhoids.) 30 g PRN   levETIRAcetam  (KEPPRA ) 1000 MG tablet Take 1 tablet (1,000 mg total) by mouth 2 (two) times daily. 90 tablet 2   LYSINE PO Take 2 tablets by mouth daily.     magnesium  oxide (MAG-OX) 400 (240 Mg) MG tablet Take 1 tablet (400 mg total) by mouth 2 (two) times daily.  60 tablet 0   meclizine  (ANTIVERT ) 50 MG tablet Take 1 tablet (50 mg total) by mouth 3 (three) times daily. 90 tablet 2   metoprolol  succinate (TOPROL -XL) 25 MG 24 hr tablet Take 1 tablet (25 mg total) by mouth daily. 90 tablet 3   Multiple Vitamin (MULTIVITAMIN WITH MINERALS) TABS tablet Take 1 tablet by mouth daily.     Nutritional Supplements (,FEEDING SUPPLEMENT, PROSOURCE PLUS) liquid Take 30 mLs by mouth 2 (two) times daily between meals.     OLANZapine  (ZYPREXA ) 2.5 MG tablet Take 1 tablet (2.5 mg total) by mouth at bedtime. 90 tablet 1   ondansetron  (ZOFRAN ) 4 MG tablet  TAKE 1 TABLET(4 MG) BY MOUTH EVERY 8 HOURS AS NEEDED FOR NAUSEA OR VOMITING (Patient taking differently: Take 4 mg by mouth every 8 (eight) hours as needed for nausea or vomiting.) 40 tablet 2   OVER THE COUNTER MEDICATION Take 2 tablets by mouth daily. Beet root     oxyCODONE  (OXY IR/ROXICODONE ) 5 MG immediate release tablet Take 1 tablet (5 mg total) by mouth every 4 (four) hours as needed for severe pain (pain score 7-10). 90 tablet 0   pantoprazole  (PROTONIX ) 40 MG tablet TAKE 1 TABLET(40 MG) BY MOUTH DAILY 90 tablet 3   polyethylene glycol (MIRALAX  / GLYCOLAX ) 17 g packet Take 17 g by mouth every evening.     potassium chloride  SA (KLOR-CON  M) 20 MEQ tablet Take 1 tablet (20 mEq total) by mouth 2 (two) times daily. 90 tablet 1   senna-docusate (SENOKOT-S) 8.6-50 MG tablet Take 2 tablets by mouth daily as needed (Patient preference, Sennakot S for mild constipation). (Patient taking differently: Take 2 tablets by mouth daily as needed for mild constipation.)     spironolactone  (ALDACTONE ) 25 MG tablet Take 1 tablet (25 mg total) by mouth daily. 90 tablet 3   VITAMIN D  PO Take 1 capsule by mouth every evening.     XTAMPZA  ER 13.5 MG C12A Take 1 capsule by mouth in the morning and at bedtime. 60 capsule 0   No current facility-administered medications for this visit.    VITAL SIGNS: There were no vitals taken for this visit. There were no vitals filed for this visit.  Estimated body mass index is 26.89 kg/m as calculated from the following:   Height as of 09/13/23: 5' 2 (1.575 m).   Weight as of 09/13/23: 147 lb (66.7 kg).   PERFORMANCE STATUS (ECOG) : 2 - Symptomatic, <50% confined to bed   Physical Exam General: NAD Cardiovascular: regular rate and rhythm Pulmonary: normal breathing pattern Abdomen: soft, nontender, + bowel sounds Extremities: no edema, no joint deformities Skin: no rashes Neurological: AAO x3  IMPRESSION:  I saw Virginia [name] during her infusion. Doing  well overall. Denies nausea, vomiting, or diarrhea. Constipation is now better controlled with daily stool softener use. She continues to take things one day at a time. Some days are better than others.    Her appetite has increased significantly, and she humorously notes she is 'about to eat the house out.' Her steroids have been stopped, and she has gained at least ten pounds, which she attributes to her increased appetite and change in medication. Her weight is currently 164 pounds, and she does not express concern about this change. No current issues with constipation or diarrhea, stating that these are under control.  We discussed her pain management. States she still has ongoing pain, rating it as seven to eight out of  ten on some days however controlled with current regimen. She is currently taking oxycodone  IR as needed in addition to her Xtampza . Tolerating without difficulty. Her husband assists with managing her medication schedule as she finds it confusing at times. No changes to regimen at this time. Refills to be sent to pharmacy as needed.   All questions answered and support provided.  I discussed the importance of continued conversation with family and their medical providers regarding overall plan of care and treatment options, ensuring decisions are within the context of the patients values and GOCs.  Assessment and Plan  Chronic Cancer Related Pain Moderate to severe pain reported at times, but patient is satisfied with current pain management regimen. No issues with constipation or diarrhea. -Continue current pain management regimen with Oxycodone  5mg  PRN and Oxycodone  ER 13.5mg   every 12 hours. -Refill Oxycodone  prescriptions, due on October 24, 2023.  Weight Gain Patient reports weight gain and increased appetite with use of steroids. -Monitor weight and appetite changes.  General Health Maintenance -Continue current medications and monitor for any side effects. -Send  prescriptions refills as needed.  -I will plan to follow-up with patient in 3-4 weeks.  Patient expressed understanding and was in agreement with this plan. She also understands that She can call the clinic at any time with any questions, concerns, or complaints.   Any controlled substances utilized were prescribed in the context of palliative care. PDMP has been reviewed.   Visit consisted of counseling and education dealing with the complex and emotionally intense issues of symptom management and palliative care in the setting of serious and potentially life-threatening illness.  Levon Borer, AGPCNP-BC  Palliative Medicine Team/[facility]

## 2023-10-11 NOTE — Telephone Encounter (Signed)
-------  Fax Transmission Report-------  To:               Recipient at [phone] Subject:          Fw: Hp Scans Result:           The transmission was successful. Explanation:      All Pages Ok Pages Sent:       3 Connect Time:     1 minutes, 34 seconds Transmit Time:    10/11/2023 08:57 Transfer Rate:    14400 Status Code:      0000 Retry Count:      0 Job Id:           [id number] Unique Id:        [id number] Fax Line:         73 Fax Server:       [id number]

## 2023-10-11 NOTE — Telephone Encounter (Signed)
-------  Fax Transmission Report-------  To:               Recipient at [phone] Subject:          Fw: Hp Scans Result:           The transmission was successful. Explanation:      All Pages Ok Pages Sent:       3 Connect Time:     1 minutes, 17 seconds Transmit Time:    10/10/2023 17:29 Transfer Rate:    14400 Status Code:      0000 Retry Count:      0 Job Id:           [id number] Unique Id:        [id number] Fax Line:         15 Fax Server:       [employer]

## 2023-10-14 ENCOUNTER — Inpatient Hospital Stay: Payer: Medicare Other | Attending: Hematology and Oncology | Admitting: Internal Medicine

## 2023-10-14 VITALS — BP 127/84 | HR 77 | Temp 98.0°F | Resp 15 | Wt 164.7 lb

## 2023-10-14 DIAGNOSIS — Z79620 Long term (current) use of immunosuppressive biologic: Secondary | ICD-10-CM | POA: Diagnosis not present

## 2023-10-14 DIAGNOSIS — Z8249 Family history of ischemic heart disease and other diseases of the circulatory system: Secondary | ICD-10-CM | POA: Insufficient documentation

## 2023-10-14 DIAGNOSIS — E876 Hypokalemia: Secondary | ICD-10-CM | POA: Diagnosis not present

## 2023-10-14 DIAGNOSIS — C7931 Secondary malignant neoplasm of brain: Secondary | ICD-10-CM

## 2023-10-14 DIAGNOSIS — I10 Essential (primary) hypertension: Secondary | ICD-10-CM | POA: Diagnosis not present

## 2023-10-14 DIAGNOSIS — Z87891 Personal history of nicotine dependence: Secondary | ICD-10-CM | POA: Insufficient documentation

## 2023-10-14 DIAGNOSIS — Z5112 Encounter for antineoplastic immunotherapy: Secondary | ICD-10-CM | POA: Insufficient documentation

## 2023-10-14 DIAGNOSIS — Z814 Family history of other substance abuse and dependence: Secondary | ICD-10-CM | POA: Diagnosis not present

## 2023-10-14 DIAGNOSIS — R635 Abnormal weight gain: Secondary | ICD-10-CM | POA: Diagnosis not present

## 2023-10-14 DIAGNOSIS — Z515 Encounter for palliative care: Secondary | ICD-10-CM | POA: Insufficient documentation

## 2023-10-14 DIAGNOSIS — Z90710 Acquired absence of both cervix and uterus: Secondary | ICD-10-CM | POA: Insufficient documentation

## 2023-10-14 DIAGNOSIS — Z79899 Other long term (current) drug therapy: Secondary | ICD-10-CM | POA: Insufficient documentation

## 2023-10-14 DIAGNOSIS — C3490 Malignant neoplasm of unspecified part of unspecified bronchus or lung: Secondary | ICD-10-CM

## 2023-10-14 DIAGNOSIS — K5900 Constipation, unspecified: Secondary | ICD-10-CM | POA: Diagnosis not present

## 2023-10-14 DIAGNOSIS — G893 Neoplasm related pain (acute) (chronic): Secondary | ICD-10-CM | POA: Insufficient documentation

## 2023-10-14 DIAGNOSIS — R42 Dizziness and giddiness: Secondary | ICD-10-CM | POA: Insufficient documentation

## 2023-10-14 DIAGNOSIS — Z86718 Personal history of other venous thrombosis and embolism: Secondary | ICD-10-CM | POA: Diagnosis not present

## 2023-10-14 DIAGNOSIS — C3431 Malignant neoplasm of lower lobe, right bronchus or lung: Secondary | ICD-10-CM | POA: Diagnosis present

## 2023-10-14 DIAGNOSIS — Z809 Family history of malignant neoplasm, unspecified: Secondary | ICD-10-CM | POA: Insufficient documentation

## 2023-10-14 DIAGNOSIS — Z9049 Acquired absence of other specified parts of digestive tract: Secondary | ICD-10-CM | POA: Diagnosis not present

## 2023-10-14 DIAGNOSIS — Z7901 Long term (current) use of anticoagulants: Secondary | ICD-10-CM | POA: Diagnosis not present

## 2023-10-14 DIAGNOSIS — Z8673 Personal history of transient ischemic attack (TIA), and cerebral infarction without residual deficits: Secondary | ICD-10-CM | POA: Diagnosis not present

## 2023-10-14 NOTE — Progress Notes (Signed)
[facility] at [facility] [address]  [place], [place] [address] [phone]   Interval Evaluation  Date of Service: 10/14/23 Patient Name: Virginia [name] Patient MRN: [id number] Patient DOB: 1955/11/07 Provider: Henreitta Leber, MD  Identifying Statement:  Virginia [name] is a 68 y.o. 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 Basilio Cairo 12/02/20: Craniotomy, resection of progressive cerebellar mass by Dr. Venetia Maxon.  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  She feels stable overall, now on anticoagulation following recent pulmonary embolism.  Decadron is currently at 1mg  daily.  Otherwise continues to experience same burden of dizziness, vertigo which limits her ability to walk and perform activities of daily living.  She is working with PT now.  Sleep burden is lessened, more awake time.  Continues on Pem/pem with Dr. [name], pemetrexed was held last cycle.  No seizures or headaches.  H+P (10/15/19) Patient presents to clinic to discuss recent neurologic symptoms and imaging findings.  She describes a fall while climbing stairs on 1/20; she hit the back of her and lost consciousness.  She is unclear whether she lost her balance or had mechanical provocation, but she had been drinking that evening.  She was treated initially as a trauma, CT demonstrated a lesion in the posterior fossa, later confirmed as a metastasis by MRI, along with 7 additional smaller lesions.  It took her almost two weeks to return for cognition and balance to return to "normal" which she feels she is about at right now.  Baseline she is high functioning and independent, works as a  Haematologist.  Has been taking decadron 2mg  twice per day.  Plans to initiate chemotherapy soon with Dr. [name].  Medications: Current Outpatient Medications on File Prior to Visit  Medication Sig Dispense Refill   acetaminophen (TYLENOL) 325 MG tablet Take 1-2 tablets (325-650 mg total) by mouth every 4 (four) hours as needed for mild pain (pain score 1-3).     albuterol (VENTOLIN HFA) 108 (90 Base) MCG/ACT inhaler INHALE 2 PUFFS INTO THE LUNGS EVERY 6 HOURS AS NEEDED FOR WHEEZING OR SHORTNESS OF BREATH (Patient taking differently: Inhale 2 puffs into the lungs every 6 (six) hours as needed for wheezing or shortness of breath.) 6.7 g 11   ALPRAZolam (XANAX) 0.25 MG tablet Take 1 tablet (0.25 mg total) by mouth 2 (two) times daily as needed for anxiety. 30 tablet 0   apixaban (ELIQUIS) 5 MG TABS tablet Take 1 tablet (5 mg total) by mouth 2 (two) times daily. 60 tablet 2   Cyanocobalamin (VITAMIN B12 PO) Take 1 tablet by mouth daily. Gummies     dexamethasone (DECADRON) 1 MG tablet Take 1 tablet (1 mg total) by mouth daily. 90 tablet 0   feeding supplement (ENSURE ENLIVE / ENSURE PLUS) LIQD Take 237 mLs by mouth 2 (two) times daily between meals.     hydrocortisone (ANUSOL-HC) 2.5 % rectal cream use in rectal area 2 to 3 times a day as needed (Patient taking differently: Place 1 Application rectally daily as needed for hemorrhoids.) 30 g PRN   levETIRAcetam (KEPPRA) 1000 MG tablet Take 1 tablet (1,000 mg total) by mouth 2 (two) times daily. 90  tablet 2   LYSINE PO Take 2 tablets by mouth daily.     magnesium oxide (MAG-OX) 400 (240 Mg) MG tablet Take 1 tablet (400 mg total) by mouth 2 (two) times daily. 60 tablet 0   meclizine (ANTIVERT) 50 MG tablet Take 1 tablet (50 mg total) by mouth 3 (three) times daily. 90 tablet 2   metoprolol succinate (TOPROL-XL) 25 MG 24 hr tablet Take 1 tablet (25 mg total) by mouth daily. 90 tablet 3   Multiple Vitamin (MULTIVITAMIN WITH MINERALS) TABS  tablet Take 1 tablet by mouth daily.     Nutritional Supplements (,FEEDING SUPPLEMENT, PROSOURCE PLUS) liquid Take 30 mLs by mouth 2 (two) times daily between meals.     OLANZapine (ZYPREXA) 2.5 MG tablet Take 1 tablet (2.5 mg total) by mouth at bedtime. 90 tablet 1   ondansetron (ZOFRAN) 4 MG tablet TAKE 1 TABLET(4 MG) BY MOUTH EVERY 8 HOURS AS NEEDED FOR NAUSEA OR VOMITING (Patient taking differently: Take 4 mg by mouth every 8 (eight) hours as needed for nausea or vomiting.) 40 tablet 2   OVER THE COUNTER MEDICATION Take 2 tablets by mouth daily. Beet root     oxyCODONE (OXY IR/ROXICODONE) 5 MG immediate release tablet Take 1 tablet (5 mg total) by mouth every 4 (four) hours as needed for severe pain (pain score 7-10). 90 tablet 0   pantoprazole (PROTONIX) 40 MG tablet TAKE 1 TABLET(40 MG) BY MOUTH DAILY 90 tablet 3   polyethylene glycol (MIRALAX / GLYCOLAX) 17 g packet Take 17 g by mouth every evening.     potassium chloride SA (KLOR-CON M) 20 MEQ tablet Take 1 tablet (20 mEq total) by mouth 2 (two) times daily. 90 tablet 1   senna-docusate (SENOKOT-S) 8.6-50 MG tablet Take 2 tablets by mouth daily as needed (Patient preference, Sennakot S for mild constipation). (Patient taking differently: Take 2 tablets by mouth daily as needed for mild constipation.)     spironolactone (ALDACTONE) 25 MG tablet Take 1 tablet (25 mg total) by mouth daily. 90 tablet 3   VITAMIN D PO Take 1 capsule by mouth every evening.     XTAMPZA ER 13.5 MG C12A Take 1 capsule by mouth in the morning and at bedtime. 60 capsule 0   No current facility-administered medications on file prior to visit.    Allergies:  Allergies  Allergen Reactions   Scopolamine Rash   Past Medical History:  Past Medical History:  Diagnosis Date   Anemia    Anxiety    Concussion 09/28/2019   DVT (deep venous thrombosis) (HCC) 2021   L leg   Dyspnea    GERD (gastroesophageal reflux disease)    Hypercholesterolemia    per pt, she  does not have elevated lipids   Hypertension    met lung ca dx'd 09/2019   mets to spine, hip and brain   PONV (postoperative nausea and vomiting)    Tobacco abuse    Past Surgical History:  Past Surgical History:  Procedure Laterality Date   ABDOMINAL HYSTERECTOMY     partial/ left ovaries   APPLICATION OF CRANIAL NAVIGATION N/A 12/02/2020   Procedure: APPLICATION OF CRANIAL NAVIGATION;  Surgeon: Maeola Harman, MD;  Location: [facility] OR;  Service: Neurosurgery;  Laterality: N/A;   CHOLECYSTECTOMY     CRANIOTOMY N/A 12/02/2020   Procedure: Posterior fossa craniotomy for tumor resection with brainlab;  Surgeon: Maeola Harman, MD;  Location: [facility] OR;  Service: Neurosurgery;  Laterality: N/A;   DILATION AND  CURETTAGE OF UTERUS     IR IMAGING GUIDED PORT INSERTION  10/23/2019   IR PATIENT EVAL TECH 0-60 MINS  11/21/2021   IR PATIENT EVAL TECH 0-60 MINS  11/24/2021   IR PATIENT EVAL TECH 0-60 MINS  11/29/2021   IR PATIENT EVAL TECH 0-60 MINS  12/04/2021   IR PATIENT EVAL TECH 0-60 MINS  12/12/2021   IR PATIENT EVAL TECH 0-60 MINS  12/19/2021   IR PATIENT EVAL TECH 0-60 MINS  12/27/2021   IR PATIENT EVAL TECH 0-60 MINS  01/03/2022   IR PATIENT EVAL TECH 0-60 MINS  01/10/2022   IR PATIENT EVAL TECH 0-60 MINS  01/18/2022   IR PATIENT EVAL TECH 0-60 MINS  02/13/2022   IR PATIENT EVAL TECH 0-60 MINS  02/20/2022   IR RADIOLOGIST EVAL & MGMT  01/23/2022   IR REMOVAL TUN ACCESS W/ PORT W/O FL MOD SED  11/17/2021   KYPHOPLASTY N/A 03/15/2020   Procedure: Thoracic Eight KYPHOPLASTY;  Surgeon: Maeola Harman, MD;  Location: [facility] OR;  Service: Neurosurgery;  Laterality: N/A;  prone    LIPOMA EXCISION  2018   removed under left breast and right thigh.   TUBAL LIGATION     Social History:  Social History   Socioeconomic History   Marital status: Married    Spouse name: Not on file   Number of children: 3   Years of education: Not on file   Highest education level: Not on file  Occupational History   Occupation: owns  Editor, commissioning co    Employer: [employer]  Tobacco Use   Smoking status: Former    Current packs/day: 0.00    Types: Cigarettes    Quit date: 10/31/2012    Years since quitting: 10.9   Smokeless tobacco: Never  Vaping Use   Vaping status: Never Used  Substance and Sexual Activity   Alcohol use: Yes    Alcohol/week: 0.0 standard drinks of alcohol    Comment: rare   Drug use: No   Sexual activity: Yes  Other Topics Concern   Not on file  Social History Narrative   Not on file   Social Drivers of Health   Financial Resource Strain: Not on file  Food Insecurity: No Food Insecurity (08/26/2023)   Hunger Vital Sign    Worried About Running Out of Food in the Last Year: Never true    Ran Out of Food in the Last Year: Never true  Transportation Needs: No Transportation Needs (08/26/2023)   PRAPARE - Administrator, Civil Service (Medical): No    Lack of Transportation (Non-Medical): No  Physical Activity: Not on file  Stress: Not on file  Social Connections: Not on file  Intimate Partner Violence: Not At Risk (08/26/2023)   Humiliation, Afraid, Rape, and Kick questionnaire    Fear of Current or Ex-Partner: No    Emotionally Abused: No    Physically Abused: No    Sexually Abused: No   Family History:  Family History  Problem Relation Age of Onset   Heart disease Father    Drug abuse Daughter    Drug abuse Son    Cancer Sister    Heart disease Brother    Heart attack Brother     Review of Systems: Constitutional: Doesn't report fevers, chills or abnormal weight loss Eyes: Doesn't report blurriness of vision Ears, nose, mouth, throat, and face: Doesn't report sore throat Respiratory: Doesn't report cough, dyspnea or wheezes Cardiovascular: Doesn't report palpitation, chest  discomfort  Gastrointestinal:  Doesn't report nausea, constipation, diarrhea GU: Doesn't report incontinence Skin: Doesn't report skin rashes Neurological: Per HPI Musculoskeletal:  Doesn't report joint pain Behavioral/Psych: Doesn't report anxiety  Physical Exam: Wt Readings from Last 3 Encounters:  09/13/23 147 lb (66.7 kg)  09/02/23 149 lb 14.4 oz (68 kg)  08/26/23 158 lb 8.2 oz (71.9 kg)   Temp Readings from Last 3 Encounters:  09/23/23 97.9 F (36.6 C) (Oral)  09/02/23 98 F (36.7 C) (Temporal)  08/29/23 98.2 F (36.8 C) (Oral)   BP Readings from Last 3 Encounters:  09/23/23 138/85  09/18/23 136/89  09/13/23 130/85   Pulse Readings from Last 3 Encounters:  09/23/23 78  09/02/23 78  08/29/23 87    KPS: 60. General: Wheelchair bound  Head: Normal EENT: No conjunctival injection or scleral icterus.  Lungs: Resp effort normal Cardiac: Regular rate Abdomen: Non-distended abdomen Skin: No rashes cyanosis or petechiae. Extremities: No clubbing or edema  Neurologic Exam: Mental Status: Awake, alert, attentive to examiner. Oriented to self and environment. Language is fluent with intact comprehension.  Cranial Nerves: Visual acuity is grossly normal. Noted dysarthria. Visual fields are full. Extra-ocular movements intact. No ptosis. Face is symmetric Motor: Tone and bulk are normal. Power is full in both arms and legs. Reflexes are symmetric, no pathologic reflexes present.  Dense dysmetria, coordination impaired bilaterally. Sensory: Intact to light touch Gait: Non ambulatory today  Labs: I have reviewed the data as listed    Component Value Date/Time   NA 139 09/23/2023 1046   K 4.3 09/23/2023 1046   CL 105 09/23/2023 1046   CO2 30 09/23/2023 1046   GLUCOSE 116 (H) 09/23/2023 1046   BUN 51 (H) 09/23/2023 1046   CREATININE 1.24 (H) 09/23/2023 1046   CREATININE 0.58 11/15/2017 0948   CALCIUM 9.4 09/23/2023 1046   PROT 6.4 (L) 09/23/2023 1046   ALBUMIN 3.2 (L) 09/23/2023 1046   AST 13 (L) 09/23/2023 1046   ALT 11 09/23/2023 1046   ALKPHOS 42 09/23/2023 1046   BILITOT 0.2 09/23/2023 1046   GFRNONAA 48 (L) 09/23/2023 1046   GFRNONAA 100  11/15/2017 0948   GFRAA >60 06/09/2020 1051   GFRAA 116 11/15/2017 0948   Lab Results  Component Value Date   WBC 11.5 (H) 09/23/2023   NEUTROABS 6.7 09/23/2023   HGB 11.5 (L) 09/23/2023   HCT 34.6 (L) 09/23/2023   MCV 97.5 09/23/2023   PLT 215 09/23/2023    Imaging:  [facility] Clinician Interpretation: I have personally reviewed the CNS images as listed.  My interpretation, in the context of the patient's clinical presentation, is stable disease    MR BRAIN W WO CONTRAST Result Date: 10/08/2023 CLINICAL DATA:  Brain/CNS neoplasm, assess treatment response. Metastatic non-small cell lung cancer. EXAM: MRI HEAD WITHOUT AND WITH CONTRAST TECHNIQUE: Multiplanar, multiecho pulse sequences of the brain and surrounding structures were obtained without and with intravenous contrast. CONTRAST:  6.5 cc V[employer]way COMPARISON:  08/19/2023 FINDINGS: Brain: Since the study 08/19/2023, there is remarkable stability. The only real change I can see is a slight increase in hemorrhage associated with an enhancing region in the right parietal lobe, series 13 axial images 91 through 107, with slightly more vasogenic edema within the right parietal region, particularly manifest towards the upper extent as seen on series 10, images 44 through 46. The actual region of enhancement appears quite similar. Elsewhere, multiple previously seen treated hemorrhagic lesions are not measurably changed since December. One could question if  there is slightly more hemorrhage in the cerebellum as well, but that is not as definite a change as we see in the parietal region on the right. None of the foci of enhancement are enlarging, but by the same token, they have not shrunk or disappeared. There are no newly seen foci of involvement. Chronic T2 and FLAIR signal in other locations throughout the brain are stable since the prior exam. Ventricular size is stable. Vascular: Major vessels at the base of the brain show flow. Skull and upper  cervical spine: Negative Sinuses/Orbits: Clear/normal Other: None IMPRESSION: Since the study 08/19/2023, there is remarkable stability. The only real change I can see is a slight increase in hemorrhage associated with an enhancing region in the right parietal lobe, with slightly more vasogenic edema within the right parietal region, particularly manifest towards the upper extent. The actual region of enhancement appears quite similar. Elsewhere, multiple previously seen treated hemorrhagic lesions are not measurably changed. One could question if there is slightly more hemorrhage in the cerebellum as well, but that is not as definite a change. None of the foci of enhancement are enlarging, but by the same token, they have not shrunk or disappeared. There are no newly seen foci of involvement. Electronically Signed   By: Paulina Fusi M.D.   On: 10/08/2023 14:43     Assessment/Plan Lung cancer metastatic to brain [facility])   Virginia [name] is clinically stable or improved today.  MRI demonstrates stable findings overall after some progression noted on December study (prior to PE).    Recommended discontinuing decadron if able.    Should continue to work with PT.  Will con't to follow with [facility] for systemic therapy.    For vertigo, can con't meclizine dose 50mg  for PRN TID if helpful.    We appreciate the opportunity to participate in the care of Virginia [name].    We ask that Virginia [name] return to clinic in 4 months following next brain MRI, or sooner as needed.  All questions were answered. The patient knows to call the clinic with any problems, questions or concerns. No barriers to learning were detected.  The total time spent in the encounter was 40 minutes and more than 50% was on counseling and review of test results   Henreitta Leber, MD Medical Director of Neuro-Oncology [facility] at [place] 10/14/23 12:37 PM

## 2023-10-15 ENCOUNTER — Inpatient Hospital Stay (HOSPITAL_BASED_OUTPATIENT_CLINIC_OR_DEPARTMENT_OTHER): Payer: Medicare Other | Admitting: Nurse Practitioner

## 2023-10-15 ENCOUNTER — Encounter: Payer: Self-pay | Admitting: Nurse Practitioner

## 2023-10-15 ENCOUNTER — Inpatient Hospital Stay: Payer: Medicare Other

## 2023-10-15 ENCOUNTER — Telehealth: Payer: Self-pay | Admitting: Internal Medicine

## 2023-10-15 ENCOUNTER — Inpatient Hospital Stay (HOSPITAL_BASED_OUTPATIENT_CLINIC_OR_DEPARTMENT_OTHER): Payer: Medicare Other | Admitting: Physician Assistant

## 2023-10-15 VITALS — BP 133/90 | HR 72 | Temp 97.7°F | Resp 15 | Wt 164.0 lb

## 2023-10-15 DIAGNOSIS — R918 Other nonspecific abnormal finding of lung field: Secondary | ICD-10-CM

## 2023-10-15 DIAGNOSIS — R530 Neoplastic (malignant) related fatigue: Secondary | ICD-10-CM | POA: Diagnosis not present

## 2023-10-15 DIAGNOSIS — C7931 Secondary malignant neoplasm of brain: Secondary | ICD-10-CM | POA: Diagnosis not present

## 2023-10-15 DIAGNOSIS — Z5112 Encounter for antineoplastic immunotherapy: Secondary | ICD-10-CM | POA: Diagnosis not present

## 2023-10-15 DIAGNOSIS — G893 Neoplasm related pain (acute) (chronic): Secondary | ICD-10-CM | POA: Diagnosis not present

## 2023-10-15 DIAGNOSIS — Z515 Encounter for palliative care: Secondary | ICD-10-CM | POA: Diagnosis not present

## 2023-10-15 DIAGNOSIS — C3490 Malignant neoplasm of unspecified part of unspecified bronchus or lung: Secondary | ICD-10-CM

## 2023-10-15 DIAGNOSIS — C7951 Secondary malignant neoplasm of bone: Secondary | ICD-10-CM

## 2023-10-15 DIAGNOSIS — K5903 Drug induced constipation: Secondary | ICD-10-CM | POA: Diagnosis not present

## 2023-10-15 LAB — CBC WITH DIFFERENTIAL (CANCER CENTER ONLY)
Abs Immature Granulocytes: 0.16 10*3/uL — ABNORMAL HIGH (ref 0.00–0.07)
Basophils Absolute: 0.1 10*3/uL (ref 0.0–0.1)
Basophils Relative: 1 %
Eosinophils Absolute: 0.2 10*3/uL (ref 0.0–0.5)
Eosinophils Relative: 2 %
HCT: 36.2 % (ref 36.0–46.0)
Hemoglobin: 12.1 g/dL (ref 12.0–15.0)
Immature Granulocytes: 2 %
Lymphocytes Relative: 25 %
Lymphs Abs: 2.3 10*3/uL (ref 0.7–4.0)
MCH: 31.9 pg (ref 26.0–34.0)
MCHC: 33.4 g/dL (ref 30.0–36.0)
MCV: 95.5 fL (ref 80.0–100.0)
Monocytes Absolute: 0.9 10*3/uL (ref 0.1–1.0)
Monocytes Relative: 10 %
Neutro Abs: 5.7 10*3/uL (ref 1.7–7.7)
Neutrophils Relative %: 60 %
Platelet Count: 238 10*3/uL (ref 150–400)
RBC: 3.79 MIL/uL — ABNORMAL LOW (ref 3.87–5.11)
RDW: 14.1 % (ref 11.5–15.5)
WBC Count: 9.3 10*3/uL (ref 4.0–10.5)
nRBC: 0 % (ref 0.0–0.2)

## 2023-10-15 LAB — CMP (CANCER CENTER ONLY)
ALT: 23 U/L (ref 0–44)
AST: 16 U/L (ref 15–41)
Albumin: 3.4 g/dL — ABNORMAL LOW (ref 3.5–5.0)
Alkaline Phosphatase: 35 U/L — ABNORMAL LOW (ref 38–126)
Anion gap: 6 (ref 5–15)
BUN: 50 mg/dL — ABNORMAL HIGH (ref 8–23)
CO2: 27 mmol/L (ref 22–32)
Calcium: 9.4 mg/dL (ref 8.9–10.3)
Chloride: 108 mmol/L (ref 98–111)
Creatinine: 1.36 mg/dL — ABNORMAL HIGH (ref 0.44–1.00)
GFR, Estimated: 43 mL/min — ABNORMAL LOW (ref >60)
Glucose, Bld: 124 mg/dL — ABNORMAL HIGH (ref 70–99)
Potassium: 4.3 mmol/L (ref 3.5–5.1)
Sodium: 141 mmol/L (ref 135–145)
Total Bilirubin: 0.3 mg/dL (ref 0.0–1.2)
Total Protein: 6.3 g/dL — ABNORMAL LOW (ref 6.5–8.1)

## 2023-10-15 LAB — TSH: TSH: 2.019 u[IU]/mL (ref 0.350–4.500)

## 2023-10-15 MED ORDER — XTAMPZA ER 13.5 MG PO C12A: 1.0000 | EXTENDED_RELEASE_CAPSULE | Freq: Two times a day (BID) | ORAL | 0 refills | Status: DC

## 2023-10-15 MED ORDER — CYANOCOBALAMIN 1000 MCG/ML IJ SOLN
1000.0000 ug | Freq: Once | INTRAMUSCULAR | Status: AC
  Administered 2023-10-15: 1000 ug via INTRAMUSCULAR
  Filled 2023-10-15: qty 1

## 2023-10-15 MED ORDER — PROCHLORPERAZINE MALEATE 10 MG PO TABS
10.0000 mg | ORAL_TABLET | Freq: Once | ORAL | Status: AC
  Administered 2023-10-15: 10 mg via ORAL
  Filled 2023-10-15: qty 1

## 2023-10-15 MED ORDER — SODIUM CHLORIDE 0.9 % IV SOLN
200.0000 mg | Freq: Once | INTRAVENOUS | Status: AC
  Administered 2023-10-15: 200 mg via INTRAVENOUS
  Filled 2023-10-15: qty 200

## 2023-10-15 MED ORDER — OXYCODONE HCL 5 MG PO TABS: 5.0000 mg | ORAL_TABLET | ORAL | 0 refills | Status: DC | PRN

## 2023-10-15 MED ORDER — SODIUM CHLORIDE 0.9 % IV SOLN
Freq: Once | INTRAVENOUS | Status: AC
Start: 2023-10-15 — End: 2023-10-15

## 2023-10-15 NOTE — Progress Notes (Signed)
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 CT of the head showed showed concerning for metastatic disease involving the left cerebellum and right occipital lobe.  2) 09/30/2019: MRI brain confirms mass within the left cerebellum measures 1.6 x 1.3 x 1.5 cm as well as at least 8 metastatic lesions within the supratentorial and infratentorial brain as outlined 3) 10/01/2019: PET CT scan revealed hypermetabolic infrahilar right lower lobe mass with hypermetabolic nodal metastases in the right hilum, mediastinum, right supraclavicular region, left axilla and lower left neck. 4) 10/08/2019: US guided biopsy of the lymph nodes confirms poorly differentiated non-small cell carcinoma. Immunohistochemistry confirms adenocarcinoma of lung primary. PD-L1 and NGS later revealed a MET Exon 14 mutation and TPS of 90%.  5) 10/12/2019: Establish care with Dr. [name]  6) 11/04/2019: Day 1 of capmatinib 400mg  BID 7) 11/09/2019: presented to Rad/Onc with a DVT in LLE. Seen in symptom management clinic and started on apixaban.  8) 12/29/2019: CT C/A/P showed interval decrease in size of mass involving the superior segment of right lower lobe and reduction in mediastinal and left supraclavicular adenopathy though some small spinal lesions were noted in the interim between the two sets of  scans 9) 01/25/2020: temporarily stopped capmatinib due to symptoms of nausea/fatigue. Restarted therapy on 01/30/2020 after brief chemo holiday.  10) 03/22/2020: CT C/A/P reveals a mixed picture, with new lung nodule and increased lymphadenopathy, however response in bone lesions. D/c capmatinib therapy 11) 04/08/2020: start of Carbo/Pem/Pem for 2nd line treatment. Cycle 1 Day 1   12) 04/29/2020: Cycle 2 Day 1 Carbo/Pem/Pem  13) 05/20/2020: Cycle 3 Day 1 Carbo/Pem/Pem  14) 06/09/2020: Cycle 4 Day 1 Carbo/Pem/Pem  15) 06/29/2020: Cycle 5 Day 1 maintenance Pem/Pem  16) 07/20/2020: Cycle 6 Day 1 maintenance Pem/Pem  17) 08/12/2020: Cycle 7 Day 1 maintenance Pem/Pem  18) 09/01/2020: Cycle 8 Day 1 maintenance Pem/Pem  19) 09/21/2020: Cycle 9 Day 1 maintenance Pem/Pem  20) 10/13/2020: Cycle 10 Day 1 maintenance Pem/Pem  21) 11/03/2020: Cycle 11 Day 1 maintenance Pem/Pem  22) 11/25/2020:  Cycle 12 Day 1 maintenance Pem/Pem 23) 12/15/2020:  Cycle 13 Day 1 maintenance Pem/Pem 24) 01/05/2021:  Cycle 14 Day 1 maintenance Pem/Pem 25) 01/26/2021:  Cycle 15 Day 1 maintenance Pem/Pem plus Bevacizumab.  02/17/2021: Cycle 16 Day 1 maintenance Pem/Pem plus Bevacizumab.  03/10/2021: Cycle 17 Day 1 maintenance Pem/Pem plus Bevacizumab.  03/31/2021: Cycle 18 Day 1 maintenance Pem/Pem  04/20/2021:  Cycle 19 Day 1 maintenance Pem/Pem  05/12/2021: Cycle 20 Day 1 maintenance Pem/Pem 06/01/2021: Cycle 21 Day 1 maintenance Pem/Pem 06/22/2021: Cycle 22 Day 1 maintenance Pem/Pem 07/13/2021: Cycle 23 Day 1 maintenance Pem/Pem 08/16/2021: Cycle 24 Day 1 maintenance Pem/Pem 09/08/2021: Cycle 25 Day 1 maintenance Pem/Pem plus Bevacizumab 09/28/2021: Cycle 26 Day 1 maintenance Pem/Pem plus Bevacizumab 10/20/2021: Cycle 27 Day 1 maintenance Pem/Pem plus Bevacizumab 11/09/2021: Cycle 28 Day 1 maintenance Pem/Pem  12/06/2021: Cycle 29 Day 1 maintenance Pem/Pem  12/27/2021: Cycle  30 Day 1 maintenance Pem/Pem  01/18/2022: Cycle 31 Day 1 maintenance  Pem/Pem  02/07/2022: Cycle 32 Day 1 maintenance Pem/Pem  02/28/2022: Cycle 33 Day 1 maintenance Pem/Pem  03/23/2022: Cycle 34 Day 1 maintenance Pem/Pem  04/13/2022: Cycle 35 Day 1 maintenance Pem/Pem  05/04/2022: Cycle 36 Day 1 maintenance Pem/Pem  05/25/2022: Cycle 37 Day 1 maintenance Pem/Pem  06/14/2022: Cycle 38 Day 1 maintenance Pem/Pem 07/05/2022: Cycle 39 Day 1 maintenance Pem/Pem 08/13/2022: Cycle 40 Day 1 maintenance Pem/Pem 09/06/2022:Cycle 41 Day 1 maintenance Pem/Pem 09/27/2022: Cycle 42 Day 1 maintenance Pem/Pem 10/19/2022: Cycle 43 Day 1 maintenance Pem/Pem 11/09/2022: Cycle 44 Day 1 maintenance Pem/Pem 11/30/2022: Cycle 45 Day 1 maintenance Pem/Pem 12/20/2022: Cycle 46 Day 1 maintenance Pem/Pem 01/10/2023: Cycle 47 Day 1 maintenance Pem/Pem 01/31/2023:Cycle 48 Day 1 maintenance Pem/Pem 02/22/2023: Cycle 49 Day 1 maintenance Pem/Pem 03/15/2023: Cycle 50 Day 1 maintenance Pem/Pem 04/04/2023: Cycle 51 Day 1 maintenance Pem/Pem 04/26/2023: Cycle 52 Day 1 maintenance Pem/Pem 05/17/2023: Cycle 53 Day 1 maintenance Pem/Pem 07/19/2023: Cycle 54 Day 1 maintenance Pembro (holding Pemetrexed) 08/12/2023: Cycle 55 Day 1 maintenance Pem/Pem 09/02/2023: Cycle 56 Day 1 maintenance Pembro (holding Pemetrexed) 09/23/2023: Cycle 57 Day 1 maintenance Pembro (holding Pemetrexed) 10/15/2023: Cycle 58 Day 1 maintenance Pembro (holding Pemetrexed)   Interval History:  Virginia [name] 68 y.o. female with medical history significant for metastatic adenocarcinoma of the lung presents for a follow up visit. The patient's last visit was on 09/23/2023.  On exam today Virginia [name] reports she is overall doing well since the last visit. Her energy is fairly stable. She has a good appetite. She is planning to taper off the steroids due to weight gain and edema. She reports her dizziness did improve some after recovering from sinus infection. She denies nausea, vomiting or bowel habit changes. She denies easy bruising or  signs of bleeding.  Overall she is willing and able to continue with chemotherapy at this time. She denies any fevers, chills, sweats or chest pain. She has no other complaints.  Full 10 point ROS was otherwise negative  MEDICAL HISTORY:  Past Medical History:  Diagnosis Date   Anemia    Anxiety    Concussion 09/28/2019   DVT (deep venous thrombosis) (HCC) 2021   L leg   Dyspnea    GERD (gastroesophageal reflux disease)    Hypercholesterolemia    per pt, she does not have elevated lipids   Hypertension    met lung ca dx'd 09/2019   mets to spine, hip and brain   PONV (postoperative nausea and vomiting)    Tobacco abuse     SURGICAL HISTORY: Past Surgical History:  Procedure Laterality Date   ABDOMINAL HYSTERECTOMY     partial/ left ovaries   APPLICATION OF CRANIAL NAVIGATION N/A 12/02/2020   Procedure: APPLICATION OF CRANIAL NAVIGATION;  Surgeon: Maeola Harman, MD;  Location: [facility] OR;  Service: Neurosurgery;  Laterality: N/A;   CHOLECYSTECTOMY     CRANIOTOMY N/A 12/02/2020   Procedure: Posterior fossa craniotomy for tumor resection with brainlab;  Surgeon: Maeola Harman, MD;  Location: [facility] OR;  Service: Neurosurgery;  Laterality: N/A;   DILATION AND CURETTAGE OF UTERUS     IR IMAGING GUIDED PORT INSERTION  10/23/2019   IR PATIENT EVAL TECH 0-60 MINS  11/21/2021   IR PATIENT EVAL TECH 0-60 MINS  11/24/2021   IR PATIENT EVAL TECH 0-60 MINS  11/29/2021   IR PATIENT EVAL TECH 0-60 MINS  12/04/2021   IR PATIENT EVAL TECH 0-60  MINS  12/12/2021   IR PATIENT EVAL TECH 0-60 MINS  12/19/2021   IR PATIENT EVAL TECH 0-60 MINS  12/27/2021   IR PATIENT EVAL TECH 0-60 MINS  01/03/2022   IR PATIENT EVAL TECH 0-60 MINS  01/10/2022   IR PATIENT EVAL TECH 0-60 MINS  01/18/2022   IR PATIENT EVAL TECH 0-60 MINS  02/13/2022   IR PATIENT EVAL TECH 0-60 MINS  02/20/2022   IR RADIOLOGIST EVAL & MGMT  01/23/2022   IR REMOVAL TUN ACCESS W/ PORT W/O FL MOD SED  11/17/2021   KYPHOPLASTY N/A 03/15/2020   Procedure: Thoracic  Eight KYPHOPLASTY;  Surgeon: Maeola Harman, MD;  Location: [facility] OR;  Service: Neurosurgery;  Laterality: N/A;  prone    LIPOMA EXCISION  2018   removed under left breast and right thigh.   TUBAL LIGATION      ALLERGIES:  is allergic to scopolamine.  MEDICATIONS:  Current Outpatient Medications  Medication Sig Dispense Refill   acetaminophen (TYLENOL) 325 MG tablet Take 1-2 tablets (325-650 mg total) by mouth every 4 (four) hours as needed for mild pain (pain score 1-3).     albuterol (VENTOLIN HFA) 108 (90 Base) MCG/ACT inhaler INHALE 2 PUFFS INTO THE LUNGS EVERY 6 HOURS AS NEEDED FOR WHEEZING OR SHORTNESS OF BREATH (Patient taking differently: Inhale 2 puffs into the lungs every 6 (six) hours as needed for wheezing or shortness of breath.) 6.7 g 11   ALPRAZolam (XANAX) 0.25 MG tablet Take 1 tablet (0.25 mg total) by mouth 2 (two) times daily as needed for anxiety. 30 tablet 0   apixaban (ELIQUIS) 5 MG TABS tablet Take 1 tablet (5 mg total) by mouth 2 (two) times daily. 60 tablet 2   Cyanocobalamin (VITAMIN B12 PO) Take 1 tablet by mouth daily. Gummies     dexamethasone (DECADRON) 1 MG tablet Take 1 tablet (1 mg total) by mouth daily. 90 tablet 0   feeding supplement (ENSURE ENLIVE / ENSURE PLUS) LIQD Take 237 mLs by mouth 2 (two) times daily between meals.     hydrocortisone (ANUSOL-HC) 2.5 % rectal cream use in rectal area 2 to 3 times a day as needed (Patient taking differently: Place 1 Application rectally daily as needed for hemorrhoids.) 30 g PRN   levETIRAcetam (KEPPRA) 1000 MG tablet Take 1 tablet (1,000 mg total) by mouth 2 (two) times daily. 90 tablet 2   LYSINE PO Take 2 tablets by mouth daily.     magnesium oxide (MAG-OX) 400 (240 Mg) MG tablet Take 1 tablet (400 mg total) by mouth 2 (two) times daily. 60 tablet 0   meclizine (ANTIVERT) 50 MG tablet Take 1 tablet (50 mg total) by mouth 3 (three) times daily. 90 tablet 2   metoprolol succinate (TOPROL-XL) 25 MG 24 hr tablet Take 1  tablet (25 mg total) by mouth daily. 90 tablet 3   Multiple Vitamin (MULTIVITAMIN WITH MINERALS) TABS tablet Take 1 tablet by mouth daily.     Nutritional Supplements (,FEEDING SUPPLEMENT, PROSOURCE PLUS) liquid Take 30 mLs by mouth 2 (two) times daily between meals.     OLANZapine (ZYPREXA) 2.5 MG tablet Take 1 tablet (2.5 mg total) by mouth at bedtime. 90 tablet 1   ondansetron (ZOFRAN) 4 MG tablet TAKE 1 TABLET(4 MG) BY MOUTH EVERY 8 HOURS AS NEEDED FOR NAUSEA OR VOMITING (Patient taking differently: Take 4 mg by mouth every 8 (eight) hours as needed for nausea or vomiting.) 40 tablet 2   OVER THE COUNTER MEDICATION  Take 2 tablets by mouth daily. Beet root     oxyCODONE (OXY IR/ROXICODONE) 5 MG immediate release tablet Take 1 tablet (5 mg total) by mouth every 4 (four) hours as needed for severe pain (pain score 7-10). 90 tablet 0   pantoprazole (PROTONIX) 40 MG tablet TAKE 1 TABLET(40 MG) BY MOUTH DAILY 90 tablet 3   polyethylene glycol (MIRALAX / GLYCOLAX) 17 g packet Take 17 g by mouth every evening.     potassium chloride SA (KLOR-CON M) 20 MEQ tablet Take 1 tablet (20 mEq total) by mouth 2 (two) times daily. 90 tablet 1   senna-docusate (SENOKOT-S) 8.6-50 MG tablet Take 2 tablets by mouth daily as needed (Patient preference, Sennakot S for mild constipation). (Patient taking differently: Take 2 tablets by mouth daily as needed for mild constipation.)     spironolactone (ALDACTONE) 25 MG tablet Take 1 tablet (25 mg total) by mouth daily. 90 tablet 3   VITAMIN D PO Take 1 capsule by mouth every evening.     XTAMPZA ER 13.5 MG C12A Take 1 capsule by mouth in the morning and at bedtime. 60 capsule 0   No current facility-administered medications for this visit.    REVIEW OF SYSTEMS:   Constitutional: ( - ) fevers, ( - )  chills , ( - ) night sweats Eyes: ( - ) blurriness of vision, ( - ) double vision, ( - ) watery eyes Ears, nose, mouth, throat, and face: ( - ) mucositis, ( - ) sore  throat Respiratory: ( - ) cough, ( + ) dyspnea, ( - ) wheezes Cardiovascular: ( - ) palpitation, ( - ) chest discomfort, ( - ) lower extremity swelling Gastrointestinal:  ( + ) nausea, ( - ) heartburn, ( - ) change in bowel habits Skin: ( - ) abnormal skin rashes Lymphatics: ( - ) new lymphadenopathy, ( - ) easy bruising Neurological: ( - ) numbness, ( - ) tingling, ( - ) new weaknesses Behavioral/Psych: ( - ) mood change, ( - ) new changes  All other systems were reviewed with the patient and are negative.  PHYSICAL EXAMINATION: ECOG PERFORMANCE STATUS: 1 - Symptomatic but completely ambulatory  Vitals:   10/15/23 1315  BP: (!) 133/90  Pulse: 72  Resp: 15  Temp: 97.7 F (36.5 C)  SpO2: 97%        Filed Weights   10/15/23 1315  Weight: 164 lb (74.4 kg)      GENERAL: well appearing middle aged Caucasian female in NAD  SKIN: skin color, texture, turgor are normal, no rashes or significant lesions EYES: conjunctiva are pink and non-injected, sclera clear LUNGS: wheezing at lung bases. clear to auscultation and percussion with normal breathing effort HEART: regular rate & rhythm and no murmurs and bilateral trace pitting lower extremity edema Musculoskeletal: no cyanosis of digits and no clubbing PSYCH: alert & oriented x 3, fluent speech NEURO: no focal motor/sensory deficits  LABORATORY DATA:  I have reviewed the data as listed    Latest Ref Rng & Units 10/15/2023   12:54 PM 09/23/2023   10:46 AM 09/02/2023   12:44 PM  CBC  WBC 4.0 - 10.5 K/uL 9.3  11.5  12.7   Hemoglobin 12.0 - 15.0 g/dL 62.9  52.8  41.3   Hematocrit 36.0 - 46.0 % 36.2  34.6  34.0   Platelets 150 - 400 K/uL 238  215  579        Latest Ref Rng & Units 10/15/2023  12:54 PM 09/23/2023   10:46 AM 09/02/2023   12:44 PM  CMP  Glucose 70 - 99 mg/dL 478  295  621   BUN 8 - 23 mg/dL 50  51  50   Creatinine 0.44 - 1.00 mg/dL 3.08  6.57  8.46   Sodium 135 - 145 mmol/L 141  139  141   Potassium 3.5 -  5.1 mmol/L 4.3  4.3  5.1   Chloride 98 - 111 mmol/L 108  105  105   CO2 22 - 32 mmol/L 27  30  30    Calcium 8.9 - 10.3 mg/dL 9.4  9.4  96.2   Total Protein 6.5 - 8.1 g/dL 6.3  6.4  7.1   Total Bilirubin 0.0 - 1.2 mg/dL 0.3  0.2  0.2   Alkaline Phos 38 - 126 U/L 35  42  50   AST 15 - 41 U/L 16  13  12    ALT 0 - 44 U/L 23  11  12       RADIOGRAPHIC STUDIES: I personally have viewed the radiographic studies below: MR BRAIN W WO CONTRAST Result Date: 10/08/2023 CLINICAL DATA:  Brain/CNS neoplasm, assess treatment response. Metastatic non-small cell lung cancer. EXAM: MRI HEAD WITHOUT AND WITH CONTRAST TECHNIQUE: Multiplanar, multiecho pulse sequences of the brain and surrounding structures were obtained without and with intravenous contrast. CONTRAST:  6.5 cc Vueway COMPARISON:  08/19/2023 FINDINGS: Brain: Since the study 08/19/2023, there is remarkable stability. The only real change I can see is a slight increase in hemorrhage associated with an enhancing region in the right parietal lobe, series 13 axial images 91 through 107, with slightly more vasogenic edema within the right parietal region, particularly manifest towards the upper extent as seen on series 10, images 44 through 46. The actual region of enhancement appears quite similar. Elsewhere, multiple previously seen treated hemorrhagic lesions are not measurably changed since December. One could question if there is slightly more hemorrhage in the cerebellum as well, but that is not as definite a change as we see in the parietal region on the right. None of the foci of enhancement are enlarging, but by the same token, they have not shrunk or disappeared. There are no newly seen foci of involvement. Chronic T2 and FLAIR signal in other locations throughout the brain are stable since the prior exam. Ventricular size is stable. Vascular: Major vessels at the base of the brain show flow. Skull and upper cervical spine: Negative Sinuses/Orbits:  Clear/normal Other: None IMPRESSION: Since the study 08/19/2023, there is remarkable stability. The only real change I can see is a slight increase in hemorrhage associated with an enhancing region in the right parietal lobe, with slightly more vasogenic edema within the right parietal region, particularly manifest towards the upper extent. The actual region of enhancement appears quite similar. Elsewhere, multiple previously seen treated hemorrhagic lesions are not measurably changed. One could question if there is slightly more hemorrhage in the cerebellum as well, but that is not as definite a change. None of the foci of enhancement are enlarging, but by the same token, they have not shrunk or disappeared. There are no newly seen foci of involvement. Electronically Signed   By: Paulina Fusi M.D.   On: 10/08/2023 14:43     ASSESSMENT & PLAN OPHIE BURROWES is a 68 y.o. female with medical history significant for metastatic adenocarcinoma of the lung presents for a follow up visit. [facility] testing has shown she has a MET  Exon 14 mutation and TPS of 90%.   Previously we discussed Carboplatin/Pembrolizumab/Pemetrexed and the expected side effect from these medications.  We discussed the possible side effects of immunotherapy including colitis, hepatitis, dermatitis, and pneumonitis.  Additionally we discussed the side effects of carboplatin which include suppression of blood counts, nephrotoxicity, and nausea.  Additionally we discussed pemetrexed which can also have effects on counts and would require supplementation with vitamin B12 and folic acid.  She voiced her understanding of these side effects and treatment plans moving forward.  I also noted that we would be able to drop the chemotherapy agents that she had intolerance to continue with pembrolizumab alone given her high TPS score.  The patient has a markedly elevated TPS score (90%)   # Metastatic Adenocarcinoma of the Lung with MET Exon 14  Mutation --Current treatment includes Pem/Pem maintenance (Carboplatin dropped after Cycle 4)  --Bevacizumab was added from 01/26/2021-03/10/2021. Due to worsening dizziness, she resumed this from Cycle 25-27.  --Most recent CT CAP from 02/16/2023 showed stable disease.  PLAN: --Due for Cycle 58, Day 1 of maintenance Pem/Pem today --Labs from today were reviewed and adequate for treatment.  Labs today show white blood cell count white blood cell 9.3, hemoglobin 12.1, MCV 95.5, platelets 238 --Proceed with treatment today but will hold the pemetrexed.  Will proceed with pembrolizumab alone. --Last CT scan in Dec 2024 showed no evidence of progressive disease.  Repeat CT scan due March 2025. --RTC q 3 weeks for labs, follow up visit prior to Cycle 59.    #Brain Metastasis, stable --appreciate the assistance of Dr. Barbaraann Cao in treating her brain metastasis and symptoms. --radiation therapy to the brain completed on 11/16/2019.  --MRI brain on 04/29/2020 showed evidence of small brain bleed. Eliquis therapy was discontinued.  --MRI on 11/04/2020 showed concern for progression of intracranial disease.  --On 12/02/2020, patient underwent craniotomy, resection of progressive cerebellar mass by Dr. Venetia Maxon. 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  --MRI on 12/23/2020 showed evidence of radiation necrosis. Dr. Barbaraann Cao evaluated the patient on 01/02/2021 with recommendations to add IV Bevacizumab 10 mg/kg q 3 weeks ( started 01/26/2021). Completed a full course of this therapy. --Patient was evaluated by Dr. Barbaraann Cao on 08/29/2021 and recommended to resume Bevacizumab due to worsening dizziness.  Which was given from Cycle 25-27.  Plan: --continue to follow with Dr. Barbaraann Cao  --Most recent MRI from 10/08/2023 demonstrates stable findings overall after some progression noted on December study (prior to PE).   #Nausea/Vomiting, worsening --Regimen includes Zofran 4-8mg  q8h PRN and compazine 10mg  q6H for breakthrough PRN,  Olanzpaine 2.5 mg PO QHS to help with nausea.  She is currently using Phenergan for breakthrough. --continue to monitor   #Hypokalemia --likely 2/2 to decreased PO intake, hypovolemia, and BP medications --Potassium level 4.3 today.   --Currently on potassium chloride tablets, 40 mEq PO potassium in AM and in PM.   #Pain Control, stable --continue oxycodone 5mg  q4H PRN. Can increase dose to 10mg  q6H if pain is severe. She is taking approximately 3-4 pills per day.  --continue Xtampza 13.5 mg ER BID for long acting support.  --continue to take with Senokot to prevent opioid induced constipation. 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 Next dose due in March 2025.    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    # Hypertension: --Continue Norvasc to 10 mg daily.  --continue to monitor at home and in clinic.    No orders of the defined types were placed in this encounter.   All questions were answered. The patient knows to call the clinic with any problems, questions or concerns.  I have spent a total of 30 minutes minutes of face-to-face and non-face-to-face time, preparing to see the patient, performing a medically appropriate examination, counseling and educating the patient,  documenting clinical information in the electronic health record, and care coordination.   Virginia Kaufmann [name] Dept of Hematology and Oncology [facility] Phone: [phone]   10/15/2023 1:42 PM   Literature Support:  Lynn Ito, Lawernce Ion, Naaman Plummer, Felip E, [place] F, Domine M, [place], Hochmair MJ, [place], [place], [address], [place], Grossi F, [place], Reck M, [place], [place], [place],  Rubio-Viqueira B, Novello S, Kurata T, Gray JE, Vida J, Wei Z, Yang J, Raftopoulos H, [place], [place] [employer]; [id number] Investigators. Pembrolizumab plus Chemotherapy in Metastatic Non-Small-Cell Lung Cancer. Malva Limes Med. 2018 May 31;378(22):2078-2092.  --Median progression-free survival was 8.8 months (95% CI, 7.6 to 9.2) in the pembrolizumab-combination group and 4.9 months (95% CI, 4.7 to 5.5) in the placebo-combination group (hazard ratio for disease progression or death, 0.52; 95% CI, 0.43 to 0.64; P<0.001). Adverse events of grade 3 or higher occurred in 67.2% of the patients in the pembrolizumab-combination group and in 65.8% of those in the placebo-combination group.  Cristal Generous L. Efficacy of pemetrexed-based regimens in advanced non-small cell lung cancer patients with activating epidermal growth factor receptor mutations after tyrosine kinase inhibitor failure: a systematic review. Onco Targets Ther. 2018;11:2121-2129.   --The weighted median PFS, median OS, and ORR for patients treated with pem regimens were 5.09 months, 15.91 months, and 30.19%, respectively. Our systematic review results showed a favorable efficacy profile of pem regimens in NSCLC patients with EGFR mutation after EGFR-TKI failure.

## 2023-10-15 NOTE — Patient Instructions (Signed)
 [facility] - A DEPT OF [facility]  Discharge Instructions: Thank you for choosing [facility] to provide your oncology and hematology care.   If you have a lab appointment with the Cancer Center, please go directly to the Cancer Center and check in at the registration area.   Wear comfortable clothing and clothing appropriate for easy access to any Portacath or PICC line.   We strive to give you quality time with your provider. You may need to reschedule your appointment if you arrive late (15 or more minutes).  Arriving late affects you and other patients whose appointments are after yours.  Also, if you miss three or more appointments without notifying the office, you may be dismissed from the clinic at the provider's discretion.      For prescription refill requests, have your pharmacy contact our office and allow 72 hours for refills to be completed.    Today you received the following chemotherapy and/or immunotherapy agents: Keytruda       To help prevent nausea and vomiting after your treatment, we encourage you to take your nausea medication as directed.  BELOW ARE SYMPTOMS THAT SHOULD BE REPORTED IMMEDIATELY: *FEVER GREATER THAN 100.4 F (38 C) OR HIGHER *CHILLS OR SWEATING *NAUSEA AND VOMITING THAT IS NOT CONTROLLED WITH YOUR NAUSEA MEDICATION *UNUSUAL SHORTNESS OF BREATH *UNUSUAL BRUISING OR BLEEDING *URINARY PROBLEMS (pain or burning when urinating, or frequent urination) *BOWEL PROBLEMS (unusual diarrhea, constipation, pain near the anus) TENDERNESS IN MOUTH AND THROAT WITH OR WITHOUT PRESENCE OF ULCERS (sore throat, sores in mouth, or a toothache) UNUSUAL RASH, SWELLING OR PAIN  UNUSUAL VAGINAL DISCHARGE OR ITCHING   Items with * indicate a potential emergency and should be followed up as soon as possible or go to the Emergency Department if any problems should occur.  Please show the CHEMOTHERAPY ALERT CARD or IMMUNOTHERAPY  ALERT CARD at check-in to the Emergency Department and triage nurse.  Should you have questions after your visit or need to cancel or reschedule your appointment, please contact [facility] A DE[facility]  Dept: [phone]  and follow the prompts.  Office hours are 8:00 a.m. to 4:30 p.m. Monday - Friday. Please note that voicemails left after 4:00 p.m. may not be returned until the following business day.  We are closed weekends and major holidays. You have access to a nurse at all times for urgent questions. Please call the main number to the clinic Dept: [phone] and follow the prompts.   For any non-urgent questions, you may also contact your provider using MyChart. We now offer e-Visits for anyone 74 and older to request care online for non-urgent symptoms. For details visit [email or web address].   Also download the MyChart app! Go to the app store, search MyChart, open the app, select [facility], and log in with your MyChart username and password.

## 2023-10-15 NOTE — Telephone Encounter (Signed)
Call to scheduled follow up appointment. Randay stated they will call back to schedule appointment.

## 2023-10-17 ENCOUNTER — Other Ambulatory Visit: Payer: Self-pay | Admitting: Physician Assistant

## 2023-10-17 DIAGNOSIS — C7951 Secondary malignant neoplasm of bone: Secondary | ICD-10-CM

## 2023-10-18 ENCOUNTER — Other Ambulatory Visit: Payer: Self-pay

## 2023-10-18 DIAGNOSIS — C7951 Secondary malignant neoplasm of bone: Secondary | ICD-10-CM

## 2023-10-20 NOTE — Progress Notes (Signed)
Cardiology Office Note:   Date:  10/24/2023  ID:  Virginia [name], DOB [birth year], MRN [id number] PCP:  [name], MD  [facility] Providers Cardiologist:  [name], MD Referring MD: [name], MD  Chief Complaint/Reason for Referral: Cardiology follow-up for cardiomyopathy ASSESSMENT:    1. Cardiomyopathy, unspecified type (HCC)   2. Essential (primary) hypertension   3. Hyperlipidemia LDL goal <70   4. Aortic atherosclerosis (HCC)   5. Stage 3b chronic kidney disease (HCC)   6. 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 BMI 30.0-30.9,adult   8. Medication management     PLAN:   In order of problems listed above: Cardiomyopathy: The patient has been on immune checkpoint inhibitors in the past and this is associated with myocarditis.  I suspect she has not developed myocarditis and her mildly decreased ejection fraction in September was in relation to postcode stunning.  Continue Toprol-XL 25 mg and spironolactone 25 mg for now.  Due to lower extremity edema we will start Lasix 20 mg daily and check BMP in 1 week.  Obtain echocardiogram to evaluate LV function and will tailor therapy once these results have been reviewed. Hypertension: Blood pressure is well-controlled today. Hyperlipidemia: Will defer lipid-lowering therapy in this patient with stage IV lung cancer. Aortic atherosclerosis: Will defer aggressive lipid-lowering in this patient.  Continue Eliquis 5 mg twice daily in lieu of aspirin. Non-small cell lung: Widely metastatic to bone and brain.  Starting bevacizumab  per oncology. Elevated BMI: Given cancer history, forego aggressive treatment for elevated BMI.            Dispo:  Return in about 3 months (around 01/21/2024).      Medication Adjustments/Labs and Tests Ordered: Current medicines are reviewed at length with the patient today.  Concerns regarding medicines are outlined above.  The following changes have been made:  no  change   Labs/tests ordered: Orders Placed This Encounter  Procedures   Basic metabolic panel   ECHOCARDIOGRAM COMPLETE    Medication Changes: Meds ordered this encounter  Medications   furosemide (LASIX) 20 MG tablet    Sig: Take 1 tablet (20 mg total) by mouth daily.    Dispense:  90 tablet    Refill:  3    Current medicines are reviewed at length with the patient today.  The patient does not have concerns regarding medicines.  I spent 33 minutes reviewing all clinical data during and prior to this visit including all relevant imaging studies, laboratories, clinical information from other health systems and prior notes from both Cardiology and other specialties, interviewing the patient, conducting a complete physical examination, and coordinating care in order to formulate a comprehensive and personalized evaluation and treatment plan.   History of Present Illness:      FOCUSED PROBLEM LIST:   Lung cancer Stage IV Prior treatments with carboplatin, pembrolizumab, Bevacizumab, pemtrexed Now on pembrolizumab VF arrest September 2024 Prior DVT LE u/s + DVT 9/24 On Eliquis Prior history of ICH Cardiomyopathy EF 45 to 50%, G1 DD, no significant valve problems TTE September 2024 Hypertension Hyperlipidemia Aortic atherosclerosis Chest CT 2024 CKD stage IIIb BMI 30  2/25: Patient returns for routine cardiology follow-up.  I first met the patient after she suffered a VF arrest in September 2024.  An echocardiogram at that time demonstrated a mild cardiomyopathy.  She was discharged on a regimen including apixaban 5 mg twice daily and spironolactone 25 mg.  She has been  doing fairly well from a cardiovascular standpoint.  She has noticed a little bit more peripheral edema.  She denies any paroxysmal nocturnal dyspnea or orthopnea.  She has had no chest pain.  She is breathing fairly well and has had no acute issues.  He fortunately has not required any hospitalizations or  emergency room visits.  She denies any presyncope or syncope.  She did see palliative care recently and the visit was centered on pain control.  She is still scheduled to obtain immunotherapy per hematology and oncology.          Current Medications: Current Meds  Medication Sig   acetaminophen (TYLENOL) 325 MG tablet Take 1-2 tablets (325-650 mg total) by mouth every 4 (four) hours as needed for mild pain (pain score 1-3).   albuterol (VENTOLIN HFA) 108 (90 Base) MCG/ACT inhaler INHALE 2 PUFFS INTO THE LUNGS EVERY 6 HOURS AS NEEDED FOR WHEEZING OR SHORTNESS OF BREATH   ALPRAZolam (XANAX) 0.25 MG tablet Take 1 tablet (0.25 mg total) by mouth 2 (two) times daily as needed for anxiety.   apixaban (ELIQUIS) 5 MG TABS tablet Take 1 tablet (5 mg total) by mouth 2 (two) times daily.   Cyanocobalamin (VITAMIN B12 PO) Take 1 tablet by mouth daily. Gummies   dexamethasone (DECADRON) 1 MG tablet Take 1 tablet (1 mg total) by mouth daily.   feeding supplement (ENSURE ENLIVE / ENSURE PLUS) LIQD Take 237 mLs by mouth 2 (two) times daily between meals.   furosemide (LASIX) 20 MG tablet Take 1 tablet (20 mg total) by mouth daily.   hydrocortisone (ANUSOL-HC) 2.5 % rectal cream use in rectal area 2 to 3 times a day as needed   levETIRAcetam (KEPPRA) 1000 MG tablet Take 1 tablet (1,000 mg total) by mouth 2 (two) times daily.   magnesium oxide (MAG-OX) 400 (240 Mg) MG tablet Take 1 tablet (400 mg total) by mouth 2 (two) times daily.   meclizine (ANTIVERT) 50 MG tablet Take 1 tablet (50 mg total) by mouth 3 (three) times daily.   metoprolol succinate (TOPROL-XL) 25 MG 24 hr tablet Take 1 tablet (25 mg total) by mouth daily.   Multiple Vitamin (MULTIVITAMIN WITH MINERALS) TABS tablet Take 1 tablet by mouth daily.   Nutritional Supplements (,FEEDING SUPPLEMENT, PROSOURCE PLUS) liquid Take 30 mLs by mouth 2 (two) times daily between meals.   OLANZapine (ZYPREXA) 2.5 MG tablet Take 1 tablet (2.5 mg total) by mouth  at bedtime.   ondansetron (ZOFRAN) 4 MG tablet TAKE 1 TABLET(4 MG) BY MOUTH EVERY 8 HOURS AS NEEDED FOR NAUSEA OR VOMITING   OVER THE COUNTER MEDICATION Take 2 tablets by mouth daily. Beet root   oxyCODONE (OXY IR/ROXICODONE) 5 MG immediate release tablet Take 1 tablet (5 mg total) by mouth every 4 (four) hours as needed for severe pain (pain score 7-10).   pantoprazole (PROTONIX) 40 MG tablet TAKE 1 TABLET(40 MG) BY MOUTH DAILY   polyethylene glycol (MIRALAX / GLYCOLAX) 17 g packet Take 17 g by mouth every evening.   potassium chloride SA (KLOR-CON M) 20 MEQ tablet Take 1 tablet (20 mEq total) by mouth 2 (two) times daily.   senna-docusate (SENOKOT-S) 8.6-50 MG tablet Take 2 tablets by mouth daily as needed (Patient preference, Sennakot S for mild constipation). (Patient taking differently: Take 2 tablets by mouth daily as needed for mild constipation.)   spironolactone (ALDACTONE) 25 MG tablet Take 1 tablet (25 mg total) by mouth daily.   VITAMIN D PO Take  1 capsule by mouth every evening.   XTAMPZA ER 13.5 MG C12A Take 1 capsule by mouth in the morning and at bedtime.     Review of Systems:   Please see the history of present illness.    All other systems reviewed and are negative.     EKGs/Labs/Other Test Reviewed:   EKG: EKG from December 2024 demonstrates sinus rhythm, inferior infarction pattern, and IVCD  EKG Interpretation Date/Time:    Ventricular Rate:    PR Interval:    QRS Duration:    QT Interval:    QTC Calculation:   R Axis:      Text Interpretation:           Risk Assessment/Calculations:          Physical Exam:   VS:  BP 124/88   Pulse 64   Ht 5\' 2"  (1.575 m)   Wt 168 lb 6.4 oz (76.4 kg)   SpO2 94%   BMI 30.80 kg/m        Wt Readings from Last 3 Encounters:  10/24/23 168 lb 6.4 oz (76.4 kg)  10/15/23 164 lb (74.4 kg)  10/14/23 164 lb 11.2 oz (74.7 kg)      GENERAL:  No apparent distress, AOx3 HEENT:  No carotid bruits, +2 carotid impulses, no  scleral icterus CAR: RRR no murmurs, gallops, rubs, or thrills RES:  Clear to auscultation bilaterally ABD:  Soft, nontender, nondistended, positive bowel sounds x 4 VASC:  +2 radial pulses, +2 carotid pulses NEURO:  CN 2-12 grossly intact; motor and sensory grossly intact PSYCH:  No active depression or anxiety EXT:  +1 edema, ecchymosis, or cyanosis  Signed, Orbie Pyo, MD  10/24/2023 3:27 PM    [facility] [address] [place], [place], [place]  [address] Phone: [phone]; Fax: [phone]   Note:  This document was prepared using Dragon voice recognition software and may include unintentional dictation errors.

## 2023-10-22 ENCOUNTER — Telehealth: Payer: Self-pay | Admitting: Internal Medicine

## 2023-10-22 NOTE — Telephone Encounter (Signed)
Copied from CRM [id number]. Topic: Clinical - Home Health Verbal Orders >> Oct 22, 2023  1:05 PM Ivette P wrote: Caller/Agency: [facility] Callback Number: [phone] Service Requested: Physical Therapy and Home Health Aid  Frequency: Home Health - 2 times week for 6 weeks. Physical Therapy - 2 times for 4 weeks, 1 time for 4 weeks.  Any new concerns about the patient? No

## 2023-10-22 NOTE — Telephone Encounter (Signed)
LVM on [phone] that Dr Lenord Fellers would be glad to sign verbal orders for Virginia [name].

## 2023-10-23 ENCOUNTER — Other Ambulatory Visit: Payer: Medicare Other

## 2023-10-24 ENCOUNTER — Encounter: Payer: Self-pay | Admitting: Internal Medicine

## 2023-10-24 ENCOUNTER — Ambulatory Visit: Payer: Medicare Other | Attending: Internal Medicine | Admitting: Internal Medicine

## 2023-10-24 VITALS — BP 124/88 | HR 64 | Ht 62.0 in | Wt 168.4 lb

## 2023-10-24 DIAGNOSIS — I429 Cardiomyopathy, unspecified: Secondary | ICD-10-CM | POA: Insufficient documentation

## 2023-10-24 DIAGNOSIS — E785 Hyperlipidemia, unspecified: Secondary | ICD-10-CM | POA: Insufficient documentation

## 2023-10-24 DIAGNOSIS — C3490 Malignant neoplasm of unspecified part of unspecified bronchus or lung: Secondary | ICD-10-CM | POA: Insufficient documentation

## 2023-10-24 DIAGNOSIS — C7951 Secondary malignant neoplasm of bone: Secondary | ICD-10-CM | POA: Diagnosis present

## 2023-10-24 DIAGNOSIS — Z79899 Other long term (current) drug therapy: Secondary | ICD-10-CM | POA: Insufficient documentation

## 2023-10-24 DIAGNOSIS — I10 Essential (primary) hypertension: Secondary | ICD-10-CM | POA: Insufficient documentation

## 2023-10-24 DIAGNOSIS — Z6830 Body mass index (BMI) 30.0-30.9, adult: Secondary | ICD-10-CM | POA: Insufficient documentation

## 2023-10-24 DIAGNOSIS — N1832 Chronic kidney disease, stage 3b: Secondary | ICD-10-CM | POA: Diagnosis present

## 2023-10-24 DIAGNOSIS — I700 Atherosclerosis of aorta: Secondary | ICD-10-CM | POA: Diagnosis present

## 2023-10-24 MED ORDER — FUROSEMIDE 20 MG PO TABS
20.0000 mg | ORAL_TABLET | Freq: Every day | ORAL | 3 refills | Status: DC
Start: 2023-10-24 — End: 2023-12-06

## 2023-10-24 NOTE — Telephone Encounter (Signed)
Received the faxed orders, they have been reviewed and faxed back. [id number] [phone] and [phone], phone [phone]  Order # [id number]

## 2023-10-24 NOTE — Patient Instructions (Addendum)
Medication Instructions:  Start Lasix 20 mg once daily in the morning *If you need a refill on your cardiac medications before your next appointment, please call your pharmacy*   Lab Work: BMET ok to have drawn at oncology 2/26 If you have labs (blood work) drawn today and your tests are completely normal, you will receive your results only by: MyChart Message (if you have MyChart) OR A paper copy in the mail If you have any lab test that is abnormal or we need to change your treatment, we will call you to review the results.  Tests: ECHO Your physician has requested that you have an echocardiogram. Echocardiography is a painless test that uses sound waves to create images of your heart. It provides your doctor with information about the size and shape of your heart and how well your heart's chambers and valves are working. This procedure takes approximately one hour. There are no restrictions for this procedure. Please do NOT wear cologne, perfume, aftershave, or lotions (deodorant is allowed). Please arrive 15 minutes prior to your appointment time.  Please note: We ask at that you not bring children with you during ultrasound (echo/ vascular) testing. Due to room size and safety concerns, children are not allowed in the ultrasound rooms during exams. Our front office staff cannot provide observation of children in our lobby area while testing is being conducted. An adult accompanying a patient to their appointment will only be allowed in the ultrasound room at the discretion of the ultrasound technician under special circumstances. We apologize for any inconvenience.   Follow-Up: At [facility], you and your health needs are our priority.  As part of our continuing mission to provide you with exceptional heart care, we have created designated Provider Care Teams.  These Care Teams include your primary Cardiologist (physician) and Advanced Practice Providers (APPs -  Physician  Assistants and Nurse Practitioners) who all work together to provide you with the care you need, when you need it.  We recommend signing up for the patient portal called "MyChart".  Sign up information is provided on this After Visit Summary.  MyChart is used to connect with patients for Virtual Visits (Telemedicine).  Patients are able to view lab/test results, encounter notes, upcoming appointments, etc.  Non-urgent messages can be sent to your provider as well.   To learn more about what you can do with MyChart, go to [email or web address].    Your next appointment:   3 month(s)  Provider:   Joni Reining, NP  Other Instructions   1st Floor: - Lobby - Registration  - Pharmacy  - Lab - Cafe  2nd Floor: - PV Lab - Diagnostic Testing (echo, CT, nuclear med)  3rd Floor: - Vacant  4th Floor: - TCTS (cardiothoracic surgery) - AFib Clinic - Structural Heart Clinic - Vascular Surgery  - Vascular Ultrasound  5th Floor: - HeartCare Cardiology (general and EP) - Clinical Pharmacy for coumadin, hypertension, lipid, weight-loss medications, and med management appointments    Valet parking services will be available as well.

## 2023-10-24 NOTE — Telephone Encounter (Signed)
-------  Fax Transmission Report-------  To:               Recipient at [phone] Subject:          Fw: Hp Scans Result:           The transmission was successful. Explanation:      All Pages Ok Pages Sent:       3 Connect Time:     1 minutes, 17 seconds Transmit Time:    10/24/2023 10:10 Transfer Rate:    14400 Status Code:      0000 Retry Count:      0 Job Id:           [id number] Unique Id:        [id number] Fax Line:         37 Fax Server:       [employer]

## 2023-10-24 NOTE — Telephone Encounter (Signed)
-------  Fax Transmission Report-------  To:               Recipient at [phone] Subject:          Fw: Hp Scans Result:           The transmission was successful. Explanation:      All Pages Ok Pages Sent:       3 Connect Time:     1 minutes, 35 seconds Transmit Time:    10/24/2023 10:10 Transfer Rate:    14400 Status Code:      0000 Retry Count:      0 Job Id:           [id number] Unique Id:        [id number] Fax Line:         17 Fax Server:       [employer]

## 2023-11-01 ENCOUNTER — Encounter (HOSPITAL_COMMUNITY): Payer: Self-pay

## 2023-11-01 ENCOUNTER — Ambulatory Visit (HOSPITAL_COMMUNITY)
Admission: RE | Admit: 2023-11-01 | Discharge: 2023-11-01 | Disposition: A | Discharge status: Home / Self Care | Payer: Medicare Other | Source: Ambulatory Visit | Attending: Physician Assistant | Admitting: Physician Assistant

## 2023-11-01 ENCOUNTER — Telehealth: Payer: Self-pay | Admitting: Internal Medicine

## 2023-11-01 ENCOUNTER — Telehealth: Payer: Self-pay | Admitting: *Deleted

## 2023-11-01 DIAGNOSIS — C7951 Secondary malignant neoplasm of bone: Secondary | ICD-10-CM | POA: Diagnosis present

## 2023-11-01 DIAGNOSIS — C3490 Malignant neoplasm of unspecified part of unspecified bronchus or lung: Secondary | ICD-10-CM | POA: Insufficient documentation

## 2023-11-01 MED ORDER — IOHEXOL 300 MG/ML  SOLN
100.0000 mL | Freq: Once | INTRAMUSCULAR | Status: AC | PRN
Start: 2023-11-01 — End: 2023-11-01
  Administered 2023-11-01: 100 mL via INTRAVENOUS

## 2023-11-01 NOTE — Telephone Encounter (Signed)
Received vm message from pt's husband, Harvie Heck. He states he has done the taper of steroids to 1/2/tablet daily for 1 week and then stopped. After about 3-4 days Harvie Heck she "crashed" as evidenced by slower, slurred speech and inability to stand on her own. Harvie Heck is asking if he can resume her steroids maybe every other day. He has already given her 1 mg tablet day before yesterday and today. Please advise

## 2023-11-01 NOTE — Telephone Encounter (Signed)
Scheduled patient for follow up appointment with Dr. Barbaraann Cao on 02/13/24 to review Scans.

## 2023-11-02 NOTE — Progress Notes (Signed)
 Updated pemetrexed ERX to [id number] - 500 mg/m2  Pryor Ochoa, PharmD 11/02/23

## 2023-11-04 ENCOUNTER — Other Ambulatory Visit: Payer: Self-pay | Admitting: Physician Assistant

## 2023-11-04 ENCOUNTER — Telehealth: Payer: Self-pay | Admitting: *Deleted

## 2023-11-04 NOTE — Telephone Encounter (Signed)
 TCT patient's husband, Harvie Heck. Spoke with him Advised that recent CT scan did not show any disease progression but that it did showed increased pericardial fluid. Advised that she is going to be scheduled for a TEE as well as her current appt for cardiac echo on 11/19/23. Asked Harvie Heck is she is having any worsening SOB, chest pain.  He states that she cannot sleep laying flat so he raises the head of herbed to 20 degrees.  Overall no change in her respiratory status.

## 2023-11-05 ENCOUNTER — Ambulatory Visit: Payer: Medicare Other | Admitting: Physician Assistant

## 2023-11-05 ENCOUNTER — Ambulatory Visit: Payer: Medicare Other

## 2023-11-05 ENCOUNTER — Other Ambulatory Visit: Payer: Medicare Other

## 2023-11-05 NOTE — Progress Notes (Deleted)
 Palliative Medicine [facility]  Telephone:([phone] [phone]) [phone]   Name: Virginia [name] Date: 11/05/2023 MRN: [id number]  DOB: 1956-08-18  Patient Care Team: [name], MD as PCP - General (Internal Medicine) Jaci Standard, MD as PCP - Hematology/Oncology (Hematology and Oncology) Syliva Overman, RN as Oncology Nurse Navigator Edsel Petrin, DO as Consulting Physician [facility] and Palliative Medicine) Lbcardiology, Dory Peru, MD as Rounding Team (Cardiology) Pickenpack-Cousar, Arty Baumgartner, NP as Nurse Practitioner (Hospice and Palliative Medicine)    INTERVAL HISTORY: MARSENA [name] is a 68 y.o. female with oncologic medical history including non-small cell lung cancer (09/2019) with metastatic disease to the bone and brain.  Palliative ask to see for symptom management and goals of care.   SOCIAL HISTORY:     reports that she quit smoking about 11 years ago. Her smoking use included cigarettes. She has never used smokeless tobacco. She reports current alcohol use. She reports that she does not use drugs.  ADVANCE DIRECTIVES:  Advanced directives on file naming Manasvi Dickard and Seward Grater as the primary and secondary decision makers should the patient become unable to speak for them selves.   CODE STATUS: Full code  PAST MEDICAL HISTORY: Past Medical History:  Diagnosis Date   Anemia    Anxiety    Concussion 09/28/2019   DVT (deep venous thrombosis) (HCC) 2021   L leg   Dyspnea    GERD (gastroesophageal reflux disease)    Hypercholesterolemia    per pt, she does not have elevated lipids   Hypertension    met lung ca dx'd 09/2019   mets to spine, hip and brain   PONV (postoperative nausea and vomiting)    Tobacco abuse     ALLERGIES:  is allergic to scopolamine.  MEDICATIONS:  Current Outpatient Medications  Medication Sig Dispense Refill   acetaminophen (TYLENOL) 325 MG tablet Take 1-2 tablets (325-650 mg  total) by mouth every 4 (four) hours as needed for mild pain (pain score 1-3).     albuterol (VENTOLIN HFA) 108 (90 Base) MCG/ACT inhaler INHALE 2 PUFFS INTO THE LUNGS EVERY 6 HOURS AS NEEDED FOR WHEEZING OR SHORTNESS OF BREATH 6.7 g 11   ALPRAZolam (XANAX) 0.25 MG tablet Take 1 tablet (0.25 mg total) by mouth 2 (two) times daily as needed for anxiety. 30 tablet 0   apixaban (ELIQUIS) 5 MG TABS tablet Take 1 tablet (5 mg total) by mouth 2 (two) times daily. 60 tablet 2   Cyanocobalamin (VITAMIN B12 PO) Take 1 tablet by mouth daily. Gummies     dexamethasone (DECADRON) 1 MG tablet Take 1 tablet (1 mg total) by mouth daily. 90 tablet 0   feeding supplement (ENSURE ENLIVE / ENSURE PLUS) LIQD Take 237 mLs by mouth 2 (two) times daily between meals.     furosemide (LASIX) 20 MG tablet Take 1 tablet (20 mg total) by mouth daily. 90 tablet 3   hydrocortisone (ANUSOL-HC) 2.5 % rectal cream use in rectal area 2 to 3 times a day as needed 30 g PRN   levETIRAcetam (KEPPRA) 1000 MG tablet Take 1 tablet (1,000 mg total) by mouth 2 (two) times daily. 90 tablet 2   magnesium oxide (MAG-OX) 400 (240 Mg) MG tablet Take 1 tablet (400 mg total) by mouth 2 (two) times daily. 60 tablet 0   meclizine (ANTIVERT) 50 MG tablet Take 1 tablet (50 mg total) by mouth 3 (three) times daily. 90 tablet 2  metoprolol succinate (TOPROL-XL) 25 MG 24 hr tablet Take 1 tablet (25 mg total) by mouth daily. 90 tablet 3   Multiple Vitamin (MULTIVITAMIN WITH MINERALS) TABS tablet Take 1 tablet by mouth daily.     Nutritional Supplements (,FEEDING SUPPLEMENT, PROSOURCE PLUS) liquid Take 30 mLs by mouth 2 (two) times daily between meals.     OLANZapine (ZYPREXA) 2.5 MG tablet Take 1 tablet (2.5 mg total) by mouth at bedtime. 90 tablet 1   ondansetron (ZOFRAN) 4 MG tablet TAKE 1 TABLET(4 MG) BY MOUTH EVERY 8 HOURS AS NEEDED FOR NAUSEA OR VOMITING 40 tablet 2   OVER THE COUNTER MEDICATION Take 2 tablets by mouth daily. Beet root     oxyCODONE  (OXY IR/ROXICODONE) 5 MG immediate release tablet Take 1 tablet (5 mg total) by mouth every 4 (four) hours as needed for severe pain (pain score 7-10). 90 tablet 0   pantoprazole (PROTONIX) 40 MG tablet TAKE 1 TABLET(40 MG) BY MOUTH DAILY 90 tablet 3   polyethylene glycol (MIRALAX / GLYCOLAX) 17 g packet Take 17 g by mouth every evening.     potassium chloride SA (KLOR-CON M) 20 MEQ tablet Take 1 tablet (20 mEq total) by mouth 2 (two) times daily. 90 tablet 1   senna-docusate (SENOKOT-S) 8.6-50 MG tablet Take 2 tablets by mouth daily as needed (Patient preference, Sennakot S for mild constipation). (Patient taking differently: Take 2 tablets by mouth daily as needed for mild constipation.)     spironolactone (ALDACTONE) 25 MG tablet Take 1 tablet (25 mg total) by mouth daily. 90 tablet 3   VITAMIN D PO Take 1 capsule by mouth every evening.     XTAMPZA ER 13.5 MG C12A Take 1 capsule by mouth in the morning and at bedtime. 60 capsule 0   No current facility-administered medications for this visit.    VITAL SIGNS: There were no vitals taken for this visit. There were no vitals filed for this visit.  Estimated body mass index is 30.8 kg/m as calculated from the following:   Height as of 10/24/23: 5\' 2"  (1.575 m).   Weight as of 10/24/23: 168 lb 6.4 oz (76.4 kg).   PERFORMANCE STATUS (ECOG) : 2 - Symptomatic, <50% confined to bed   Physical Exam General: NAD Cardiovascular: regular rate and rhythm Pulmonary: normal breathing pattern Abdomen: soft, nontender, + bowel sounds Extremities: no edema, no joint deformities Skin: no rashes Neurological: AAO x3  IMPRESSION:  I saw Virginia [name] during her infusion. Doing well overall. Denies nausea, vomiting, or diarrhea. Constipation is now better controlled with daily stool softener use. She continues to take things one day at a time. Some days are better than others.    Her appetite has increased significantly, and she humorously  notes she is 'about to eat the house out.' Her steroids have been stopped, and she has gained at least ten pounds, which she attributes to her increased appetite and change in medication. Her weight is currently 164 pounds, and she does not express concern about this change. No current issues with constipation or diarrhea, stating that these are under control.  We discussed her pain management. States she still has ongoing pain, rating it as seven to eight out of ten on some days however controlled with current regimen. She is currently taking oxycodone IR as needed in addition to her Xtampza. Tolerating without difficulty. Her husband assists with managing her medication schedule as she finds it confusing at times. No changes to  regimen at this time. Refills to be sent to pharmacy as needed.   All questions answered and support provided.  I discussed the importance of continued conversation with family and their medical providers regarding overall plan of care and treatment options, ensuring decisions are within the context of the patients values and GOCs.  Assessment and Plan  Chronic Cancer Related Pain Moderate to severe pain reported at times, but patient is satisfied with current pain management regimen. No issues with constipation or diarrhea. -Continue current pain management regimen with Oxycodone 5mg  PRN and Oxycodone ER 13.5mg   every 12 hours. -Refill Oxycodone prescriptions, due on October 24, 2023.  Weight Gain Patient reports weight gain and increased appetite with use of steroids. -Monitor weight and appetite changes.  General Health Maintenance -Continue current medications and monitor for any side effects. -Send prescriptions refills as needed.  -I will plan to follow-up with patient in 3-4 weeks.  Patient expressed understanding and was in agreement with this plan. She also understands that She can call the clinic at any time with any questions, concerns, or complaints.    Any controlled substances utilized were prescribed in the context of palliative care. PDMP has been reviewed.   Visit consisted of counseling and education dealing with the complex and emotionally intense issues of symptom management and palliative care in the setting of serious and potentially life-threatening illness.  Willette Alma, AGPCNP-BC  Palliative Medicine Team/[facility]

## 2023-11-06 ENCOUNTER — Inpatient Hospital Stay: Payer: Medicare Other | Admitting: Hematology and Oncology

## 2023-11-06 ENCOUNTER — Inpatient Hospital Stay: Payer: Medicare Other | Admitting: Nurse Practitioner

## 2023-11-06 ENCOUNTER — Other Ambulatory Visit: Payer: Medicare Other

## 2023-11-06 ENCOUNTER — Inpatient Hospital Stay: Payer: Medicare Other

## 2023-11-06 ENCOUNTER — Telehealth: Payer: Self-pay | Admitting: *Deleted

## 2023-11-06 NOTE — Progress Notes (Unsigned)
Rescheduled per patient request.

## 2023-11-06 NOTE — Telephone Encounter (Signed)
 Received call from pt's husband. Virginia [name] states that Virginia [name] is not feeling well today and she not not be able to come in today for her scheduled appts. She is weaker, increased sleepiness, unable to stand well today. Virginia [name] has to pivot her from the bed to [facility]. Also c/o some nausea. Virginia [name] does state she has enough anti-nausea meds. Advised that we will just see her @ her next scheduled appt. Also advised to call if anything changes. Virginia [name] voiced understanding. Dr. [name] made aware.

## 2023-11-07 ENCOUNTER — Encounter: Payer: Self-pay | Admitting: Hematology and Oncology

## 2023-11-07 ENCOUNTER — Telehealth: Payer: Self-pay | Admitting: Internal Medicine

## 2023-11-07 NOTE — Telephone Encounter (Signed)
-------  Fax Transmission Report-------  To:               Recipient at [phone] Subject:          Fw: Hp Scans Result:           The transmission was successful. Explanation:      All Pages Ok Pages Sent:       3 Connect Time:     1 minutes, 11 seconds Transmit Time:    11/07/2023 11:33 Transfer Rate:    14400 Status Code:      0000 Retry Count:      0 Job Id:           [id number] Unique Id:        [id number] Fax Line:         28 Fax Server:       [id number]

## 2023-11-07 NOTE — Telephone Encounter (Signed)
 Received faxed orders for patient to continue with skilled HHPT Order requested PT 2W4, 1W5, [facility] 2W8  [facility]  Fax [phone] Phone [phone]

## 2023-11-08 ENCOUNTER — Telehealth: Payer: Self-pay | Admitting: Internal Medicine

## 2023-11-08 DIAGNOSIS — G893 Neoplasm related pain (acute) (chronic): Secondary | ICD-10-CM

## 2023-11-08 DIAGNOSIS — C7931 Secondary malignant neoplasm of brain: Secondary | ICD-10-CM | POA: Diagnosis not present

## 2023-11-08 DIAGNOSIS — C3490 Malignant neoplasm of unspecified part of unspecified bronchus or lung: Secondary | ICD-10-CM | POA: Diagnosis not present

## 2023-11-08 DIAGNOSIS — D630 Anemia in neoplastic disease: Secondary | ICD-10-CM

## 2023-11-08 DIAGNOSIS — Z9181 History of falling: Secondary | ICD-10-CM

## 2023-11-08 DIAGNOSIS — J9601 Acute respiratory failure with hypoxia: Secondary | ICD-10-CM | POA: Diagnosis not present

## 2023-11-08 DIAGNOSIS — C7951 Secondary malignant neoplasm of bone: Secondary | ICD-10-CM | POA: Diagnosis not present

## 2023-11-08 DIAGNOSIS — Z556 Problems related to health literacy: Secondary | ICD-10-CM

## 2023-11-08 NOTE — Telephone Encounter (Signed)
-------  Fax Transmission Report-------  To:               Recipient at [phone] Subject:          Fw: Hp Scans Result:           The transmission was successful. Explanation:      All Pages Ok Pages Sent:       8 Connect Time:     5 minutes, 31 seconds Transmit Time:    11/08/2023 12:20 Transfer Rate:    14400 Status Code:      0000 Retry Count:      0 Job Id:           [id number] Unique Id:        [id number] Fax Line:         6 Fax Server:       [employer]

## 2023-11-08 NOTE — Telephone Encounter (Signed)
 Received fax from [facility] Certification and Plan of Care to certify and sign, then fax back  Fax [phone] - [phone] Phone [phone]  Order # [id number]  Certification Period  11/08/2023 to 01/06/2024

## 2023-11-08 NOTE — Telephone Encounter (Signed)
-------  Fax Transmission Report-------  To:               Recipient at [phone] Subject:          Fw: Hp Scans Result:           The transmission was successful. Explanation:      All Pages Ok Pages Sent:       8 Connect Time:     5 minutes, 15 seconds Transmit Time:    11/08/2023 12:19 Transfer Rate:    14400 Status Code:      0000 Retry Count:      0 Job Id:           [id number] Unique Id:        [id number] Fax Line:         16 Fax Server:       [employer]

## 2023-11-12 ENCOUNTER — Other Ambulatory Visit: Payer: Self-pay | Admitting: Nurse Practitioner

## 2023-11-12 DIAGNOSIS — Z515 Encounter for palliative care: Secondary | ICD-10-CM

## 2023-11-12 DIAGNOSIS — C7951 Secondary malignant neoplasm of bone: Secondary | ICD-10-CM

## 2023-11-12 DIAGNOSIS — G893 Neoplasm related pain (acute) (chronic): Secondary | ICD-10-CM

## 2023-11-12 MED ORDER — OXYCODONE HCL 5 MG PO TABS
5.0000 mg | ORAL_TABLET | ORAL | 0 refills | Status: DC | PRN
Start: 2023-11-12 — End: 2023-12-07

## 2023-11-19 ENCOUNTER — Encounter: Payer: Self-pay | Admitting: Internal Medicine

## 2023-11-19 ENCOUNTER — Ambulatory Visit (HOSPITAL_COMMUNITY): Payer: Medicare Other | Attending: Internal Medicine

## 2023-11-19 DIAGNOSIS — I700 Atherosclerosis of aorta: Secondary | ICD-10-CM | POA: Insufficient documentation

## 2023-11-19 DIAGNOSIS — I429 Cardiomyopathy, unspecified: Secondary | ICD-10-CM | POA: Diagnosis present

## 2023-11-19 LAB — ECHOCARDIOGRAM COMPLETE
Area-P 1/2: 3.29 cm2
S' Lateral: 2.8 cm

## 2023-11-20 ENCOUNTER — Other Ambulatory Visit: Payer: Self-pay | Admitting: Nurse Practitioner

## 2023-11-20 DIAGNOSIS — Z515 Encounter for palliative care: Secondary | ICD-10-CM

## 2023-11-20 DIAGNOSIS — C7951 Secondary malignant neoplasm of bone: Secondary | ICD-10-CM

## 2023-11-20 DIAGNOSIS — G893 Neoplasm related pain (acute) (chronic): Secondary | ICD-10-CM

## 2023-11-20 MED ORDER — XTAMPZA ER 13.5 MG PO C12A
1.0000 | EXTENDED_RELEASE_CAPSULE | Freq: Two times a day (BID) | ORAL | 0 refills | Status: DC
Start: 2023-11-22 — End: 2023-12-25

## 2023-11-21 ENCOUNTER — Telehealth: Payer: Self-pay | Admitting: Internal Medicine

## 2023-11-21 NOTE — Telephone Encounter (Signed)
*  STAT* If patient is at the pharmacy, call can be transferred to refill team.   1. Which medications need to be refilled? (please list name of each medication and dose if known)  apixaban (ELIQUIS) 5 MG TABS tablet  2. Which pharmacy/location (including street and city if local pharmacy) is medication to be sent to? [facility] [phone] - [place], [place] - [address] Phone: [phone]  Fax: [phone]     3. Do they need a 30 day or 90 day supply? 90  Pt has 1 day supply left

## 2023-11-22 ENCOUNTER — Other Ambulatory Visit: Payer: Self-pay

## 2023-11-22 MED ORDER — APIXABAN 5 MG PO TABS: 5.0000 mg | ORAL_TABLET | Freq: Two times a day (BID) | ORAL | 1 refills | Status: DC

## 2023-11-22 NOTE — Telephone Encounter (Signed)
 Prescription refill request for Eliquis received. Indication:dvt Last office visit:2/25 Scr:1.36  2/25 Age: 68 Weight:76.4  kg  Prescription refilled

## 2023-11-25 ENCOUNTER — Telehealth: Payer: Self-pay | Admitting: *Deleted

## 2023-11-25 NOTE — Telephone Encounter (Signed)
 Why don't you have them see the DOD sometime next week to assess whether we should do a perciardiocentesis (given her history of cancer), and if so, they can schedule it with me the week after? Me     11/21/23  5:06 PM Result Note Contacted patient, spoke w her and spouse.  Scheduled w Dr. Lynnette Caffey 12/09/23.  Pt states she is feeling good.  Aware that if she develops chest pain, dyspnea or difficulty taking a deep breath they need to call office or go on to the [facility] ER.  Voiced understanding and agreement.  Adv that if sooner slot opens with Dr. Lynnette Caffey, I will call them.   Orbie Pyo, MD to Me  AT   11/21/23 12:51 PM It should be ok if she is doing ok _______________________________

## 2023-11-25 NOTE — Telephone Encounter (Signed)
 Called and left message for patient or her husband to call back.  Hold placed on DOD schedule at [facility] with Dr. Royann Shivers for [facility] 11/27/23.  Will need scheduled if calls back.

## 2023-11-25 NOTE — Progress Notes (Deleted)
 Palliative Medicine [facility]  [phone] [phone]   Name: Virginia [name] Date: 11/25/2023 MRN: [id number]  DOB: 01-21-1956  Patient Care Team: [name], MD as PCP - General (Internal Medicine) Jaci Standard, MD as PCP - Hematology/Oncology (Hematology and Oncology) Syliva Overman, RN as Oncology Nurse Navigator Edsel Petrin, DO as Consulting Physician [facility] and Palliative Medicine) Lbcardiology, Dory Peru, MD as Rounding Team (Cardiology) Pickenpack-Cousar, Arty Baumgartner, NP as Nurse Practitioner (Hospice and Palliative Medicine)    INTERVAL HISTORY: Virginia [name] is a 68 y.o. female with oncologic medical history including non-small cell lung cancer (09/2019) with metastatic disease to the bone and brain.  Palliative ask to see for symptom management and goals of care.   SOCIAL HISTORY:     reports that she quit smoking about 11 years ago. Her smoking use included cigarettes. She has never used smokeless tobacco. She reports current alcohol use. She reports that she does not use drugs.  ADVANCE DIRECTIVES:  Advanced directives on file naming Virginia [name] and Virginia [name] as the primary and secondary decision makers should the patient become unable to speak for them selves.   CODE STATUS: Full code  PAST MEDICAL HISTORY: Past Medical History:  Diagnosis Date  . Anemia   . Anxiety   . Concussion 09/28/2019  . DVT (deep venous thrombosis) (HCC) 2021   L leg  . Dyspnea   . GERD (gastroesophageal reflux disease)   . Hypercholesterolemia    per pt, she does not have elevated lipids  . Hypertension   . met lung ca dx'd 09/2019   mets to spine, hip and brain  . PONV (postoperative nausea and vomiting)   . Tobacco abuse     ALLERGIES:  is allergic to scopolamine.  MEDICATIONS:  Current Outpatient Medications  Medication Sig Dispense Refill  . acetaminophen (TYLENOL) 325 MG tablet Take 1-2 tablets  (325-650 mg total) by mouth every 4 (four) hours as needed for mild pain (pain score 1-3).    Marland Kitchen albuterol (VENTOLIN HFA) 108 (90 Base) MCG/ACT inhaler INHALE 2 PUFFS INTO THE LUNGS EVERY 6 HOURS AS NEEDED FOR WHEEZING OR SHORTNESS OF BREATH 6.7 g 11  . ALPRAZolam (XANAX) 0.25 MG tablet Take 1 tablet (0.25 mg total) by mouth 2 (two) times daily as needed for anxiety. 30 tablet 0  . apixaban (ELIQUIS) 5 MG TABS tablet Take 1 tablet (5 mg total) by mouth 2 (two) times daily. 180 tablet 1  . Cyanocobalamin (VITAMIN B12 PO) Take 1 tablet by mouth daily. Gummies    . dexamethasone (DECADRON) 1 MG tablet Take 1 tablet (1 mg total) by mouth daily. 90 tablet 0  . feeding supplement (ENSURE ENLIVE / ENSURE PLUS) LIQD Take 237 mLs by mouth 2 (two) times daily between meals.    . furosemide (LASIX) 20 MG tablet Take 1 tablet (20 mg total) by mouth daily. 90 tablet 3  . hydrocortisone (ANUSOL-HC) 2.5 % rectal cream use in rectal area 2 to 3 times a day as needed 30 g PRN  . levETIRAcetam (KEPPRA) 1000 MG tablet Take 1 tablet (1,000 mg total) by mouth 2 (two) times daily. 90 tablet 2  . magnesium oxide (MAG-OX) 400 (240 Mg) MG tablet Take 1 tablet (400 mg total) by mouth 2 (two) times daily. 60 tablet 0  . meclizine (ANTIVERT) 50 MG tablet Take 1 tablet (50 mg total) by mouth 3 (three) times daily. 90 tablet 2  .  metoprolol succinate (TOPROL-XL) 25 MG 24 hr tablet Take 1 tablet (25 mg total) by mouth daily. 90 tablet 3  . Multiple Vitamin (MULTIVITAMIN WITH MINERALS) TABS tablet Take 1 tablet by mouth daily.    . Nutritional Supplements (,FEEDING SUPPLEMENT, PROSOURCE PLUS) liquid Take 30 mLs by mouth 2 (two) times daily between meals.    Marland Kitchen OLANZapine (ZYPREXA) 2.5 MG tablet Take 1 tablet (2.5 mg total) by mouth at bedtime. 90 tablet 1  . ondansetron (ZOFRAN) 4 MG tablet TAKE 1 TABLET(4 MG) BY MOUTH EVERY 8 HOURS AS NEEDED FOR NAUSEA OR VOMITING 40 tablet 2  . OVER THE COUNTER MEDICATION Take 2 tablets by mouth  daily. Beet root    . oxyCODONE (OXY IR/ROXICODONE) 5 MG immediate release tablet Take 1 tablet (5 mg total) by mouth every 4 (four) hours as needed for severe pain (pain score 7-10). 90 tablet 0  . pantoprazole (PROTONIX) 40 MG tablet TAKE 1 TABLET(40 MG) BY MOUTH DAILY 90 tablet 3  . polyethylene glycol (MIRALAX / GLYCOLAX) 17 g packet Take 17 g by mouth every evening.    . potassium chloride SA (KLOR-CON M) 20 MEQ tablet Take 1 tablet (20 mEq total) by mouth 2 (two) times daily. 90 tablet 1  . senna-docusate (SENOKOT-S) 8.6-50 MG tablet Take 2 tablets by mouth daily as needed (Patient preference, Sennakot S for mild constipation). (Patient taking differently: Take 2 tablets by mouth daily as needed for mild constipation.)    . spironolactone (ALDACTONE) 25 MG tablet Take 1 tablet (25 mg total) by mouth daily. 90 tablet 3  . VITAMIN D PO Take 1 capsule by mouth every evening.    Marland Kitchen XTAMPZA ER 13.5 MG C12A Take 1 capsule by mouth in the morning and at bedtime. 60 capsule 0   No current facility-administered medications for this visit.    VITAL SIGNS: There were no vitals taken for this visit. There were no vitals filed for this visit.  Estimated body mass index is 30.8 kg/m as calculated from the following:   Height as of 10/24/23: 5\' 2"  (1.575 m).   Weight as of 10/24/23: 168 lb 6.4 oz (76.4 kg).   PERFORMANCE STATUS (ECOG) : 2 - Symptomatic, <50% confined to bed   Physical Exam General: NAD Cardiovascular: regular rate and rhythm Pulmonary: normal breathing pattern Abdomen: soft, nontender, + bowel sounds Extremities: no edema, no joint deformities Skin: no rashes Neurological: AAO x3  IMPRESSION:  I saw Virginia [name] during her infusion. Doing well overall. Denies nausea, vomiting, or diarrhea. Constipation is now better controlled with daily stool softener use. She continues to take things one day at a time. Some days are better than others.    Her appetite has  increased significantly, and she humorously notes she is 'about to eat the house out.' Her steroids have been stopped, and she has gained at least ten pounds, which she attributes to her increased appetite and change in medication. Her weight is currently 164 pounds, and she does not express concern about this change. No current issues with constipation or diarrhea, stating that these are under control.  We discussed her pain management. States she still has ongoing pain, rating it as seven to eight out of ten on some days however controlled with current regimen. She is currently taking oxycodone IR as needed in addition to her Xtampza. Tolerating without difficulty. Her husband assists with managing her medication schedule as she finds it confusing at times. No changes to  regimen at this time. Refills to be sent to pharmacy as needed.   All questions answered and support provided.  I discussed the importance of continued conversation with family and their medical providers regarding overall plan of care and treatment options, ensuring decisions are within the context of the patients values and GOCs.  Assessment and Plan  Chronic Cancer Related Pain Moderate to severe pain reported at times, but patient is satisfied with current pain management regimen. No issues with constipation or diarrhea. -Continue current pain management regimen with Oxycodone 5mg  PRN and Oxycodone ER 13.5mg   every 12 hours. -Refill Oxycodone prescriptions, due on October 24, 2023.  Weight Gain Patient reports weight gain and increased appetite with use of steroids. -Monitor weight and appetite changes.  General Health Maintenance -Continue current medications and monitor for any side effects. -Send prescriptions refills as needed.  -I will plan to follow-up with patient in 3-4 weeks.  Patient expressed understanding and was in agreement with this plan. She also understands that She can call the clinic at any time with  any questions, concerns, or complaints.   Any controlled substances utilized were prescribed in the context of palliative care. PDMP has been reviewed.   Visit consisted of counseling and education dealing with the complex and emotionally intense issues of symptom management and palliative care in the setting of serious and potentially life-threatening illness.  Virginia [name], AGPCNP-BC  Palliative Medicine Team/[facility]

## 2023-11-26 ENCOUNTER — Inpatient Hospital Stay: Payer: Medicare Other | Admitting: Hematology and Oncology

## 2023-11-26 ENCOUNTER — Telehealth: Payer: Self-pay | Admitting: Hematology and Oncology

## 2023-11-26 ENCOUNTER — Inpatient Hospital Stay: Payer: Medicare Other

## 2023-11-26 ENCOUNTER — Inpatient Hospital Stay: Payer: Medicare Other | Admitting: Nurse Practitioner

## 2023-11-26 ENCOUNTER — Inpatient Hospital Stay: Payer: Medicare Other | Attending: Hematology and Oncology

## 2023-11-26 ENCOUNTER — Ambulatory Visit: Payer: Medicare Other

## 2023-11-26 ENCOUNTER — Other Ambulatory Visit: Payer: Medicare Other

## 2023-11-26 ENCOUNTER — Ambulatory Visit: Payer: Medicare Other | Admitting: Hematology and Oncology

## 2023-11-26 NOTE — Progress Notes (Signed)
 Rescheduled

## 2023-11-26 NOTE — Progress Notes (Unsigned)
 Palliative Medicine [facility]  Telephone:(336) [phone] [phone]) [phone]   Name: Virginia [name] Date: 11/26/2023 MRN: [id number]  DOB: 18-Apr-1956  Patient Care Team: [name], MD as PCP - General (Internal Medicine) Jaci Standard, MD as PCP - Hematology/Oncology (Hematology and Oncology) Syliva Overman, RN as Oncology Nurse Navigator Edsel Petrin, DO as Consulting Physician [facility] and Palliative Medicine) Lbcardiology, Dory Peru, MD as Rounding Team (Cardiology) Pickenpack-Cousar, Arty Baumgartner, NP as Nurse Practitioner (Hospice and Palliative Medicine)    INTERVAL HISTORY: Virginia [name] is a 68 y.o. female with oncologic medical history including non-small cell lung cancer (09/2019) with metastatic disease to the bone and brain.  Palliative ask to see for symptom management and goals of care.   SOCIAL HISTORY:     reports that she quit smoking about 11 years ago. Her smoking use included cigarettes. She has never used smokeless tobacco. She reports current alcohol use. She reports that she does not use drugs.  ADVANCE DIRECTIVES:  Advanced directives on file naming Virginia [name] and Virginia [name] as the primary and secondary decision makers should the patient become unable to speak for them selves.   CODE STATUS: Full code  PAST MEDICAL HISTORY: Past Medical History:  Diagnosis Date  . Anemia   . Anxiety   . Concussion 09/28/2019  . DVT (deep venous thrombosis) (HCC) 2021   L leg  . Dyspnea   . GERD (gastroesophageal reflux disease)   . Hypercholesterolemia    per pt, she does not have elevated lipids  . Hypertension   . met lung ca dx'd 09/2019   mets to spine, hip and brain  . PONV (postoperative nausea and vomiting)   . Tobacco abuse     ALLERGIES:  is allergic to scopolamine.  MEDICATIONS:  Current Outpatient Medications  Medication Sig Dispense Refill  . acetaminophen (TYLENOL) 325 MG tablet Take 1-2 tablets  (325-650 mg total) by mouth every 4 (four) hours as needed for mild pain (pain score 1-3).    Marland Kitchen albuterol (VENTOLIN HFA) 108 (90 Base) MCG/ACT inhaler INHALE 2 PUFFS INTO THE LUNGS EVERY 6 HOURS AS NEEDED FOR WHEEZING OR SHORTNESS OF BREATH 6.7 g 11  . ALPRAZolam (XANAX) 0.25 MG tablet Take 1 tablet (0.25 mg total) by mouth 2 (two) times daily as needed for anxiety. 30 tablet 0  . apixaban (ELIQUIS) 5 MG TABS tablet Take 1 tablet (5 mg total) by mouth 2 (two) times daily. 180 tablet 1  . Cyanocobalamin (VITAMIN B12 PO) Take 1 tablet by mouth daily. Gummies    . dexamethasone (DECADRON) 1 MG tablet Take 1 tablet (1 mg total) by mouth daily. 90 tablet 0  . feeding supplement (ENSURE ENLIVE / ENSURE PLUS) LIQD Take 237 mLs by mouth 2 (two) times daily between meals.    . furosemide (LASIX) 20 MG tablet Take 1 tablet (20 mg total) by mouth daily. 90 tablet 3  . hydrocortisone (ANUSOL-HC) 2.5 % rectal cream use in rectal area 2 to 3 times a day as needed 30 g PRN  . levETIRAcetam (KEPPRA) 1000 MG tablet Take 1 tablet (1,000 mg total) by mouth 2 (two) times daily. 90 tablet 2  . magnesium oxide (MAG-OX) 400 (240 Mg) MG tablet Take 1 tablet (400 mg total) by mouth 2 (two) times daily. 60 tablet 0  . meclizine (ANTIVERT) 50 MG tablet Take 1 tablet (50 mg total) by mouth 3 (three) times daily. 90 tablet 2  .  metoprolol succinate (TOPROL-XL) 25 MG 24 hr tablet Take 1 tablet (25 mg total) by mouth daily. 90 tablet 3  . Multiple Vitamin (MULTIVITAMIN WITH MINERALS) TABS tablet Take 1 tablet by mouth daily.    . Nutritional Supplements (,FEEDING SUPPLEMENT, PROSOURCE PLUS) liquid Take 30 mLs by mouth 2 (two) times daily between meals.    Marland Kitchen OLANZapine (ZYPREXA) 2.5 MG tablet Take 1 tablet (2.5 mg total) by mouth at bedtime. 90 tablet 1  . ondansetron (ZOFRAN) 4 MG tablet TAKE 1 TABLET(4 MG) BY MOUTH EVERY 8 HOURS AS NEEDED FOR NAUSEA OR VOMITING 40 tablet 2  . OVER THE COUNTER MEDICATION Take 2 tablets by mouth  daily. Beet root    . oxyCODONE (OXY IR/ROXICODONE) 5 MG immediate release tablet Take 1 tablet (5 mg total) by mouth every 4 (four) hours as needed for severe pain (pain score 7-10). 90 tablet 0  . pantoprazole (PROTONIX) 40 MG tablet TAKE 1 TABLET(40 MG) BY MOUTH DAILY 90 tablet 3  . polyethylene glycol (MIRALAX / GLYCOLAX) 17 g packet Take 17 g by mouth every evening.    . potassium chloride SA (KLOR-CON M) 20 MEQ tablet Take 1 tablet (20 mEq total) by mouth 2 (two) times daily. 90 tablet 1  . senna-docusate (SENOKOT-S) 8.6-50 MG tablet Take 2 tablets by mouth daily as needed (Patient preference, Sennakot S for mild constipation). (Patient taking differently: Take 2 tablets by mouth daily as needed for mild constipation.)    . spironolactone (ALDACTONE) 25 MG tablet Take 1 tablet (25 mg total) by mouth daily. 90 tablet 3  . VITAMIN D PO Take 1 capsule by mouth every evening.    Marland Kitchen XTAMPZA ER 13.5 MG C12A Take 1 capsule by mouth in the morning and at bedtime. 60 capsule 0   No current facility-administered medications for this visit.    VITAL SIGNS: There were no vitals taken for this visit. There were no vitals filed for this visit.  Estimated body mass index is 30.8 kg/m as calculated from the following:   Height as of 10/24/23: 5\' 2"  (1.575 m).   Weight as of 10/24/23: 168 lb 6.4 oz (76.4 kg).   PERFORMANCE STATUS (ECOG) : 2 - Symptomatic, <50% confined to bed   Physical Exam General: NAD Cardiovascular: regular rate and rhythm Pulmonary: normal breathing pattern Abdomen: soft, nontender, + bowel sounds Extremities: no edema, no joint deformities Skin: no rashes Neurological: AAO x3  IMPRESSION:  I saw Virginia [name] during her infusion. Doing well overall. Denies nausea, vomiting, or diarrhea. Constipation is now better controlled with daily stool softener use. She continues to take things one day at a time. Some days are better than others.    Her appetite has  increased significantly, and she humorously notes she is 'about to eat the house out.' Her steroids have been stopped, and she has gained at least ten pounds, which she attributes to her increased appetite and change in medication. Her weight is currently 164 pounds, and she does not express concern about this change. No current issues with constipation or diarrhea, stating that these are under control.  We discussed her pain management. States she still has ongoing pain, rating it as seven to eight out of ten on some days however controlled with current regimen. She is currently taking oxycodone IR as needed in addition to her Xtampza. Tolerating without difficulty. Her husband assists with managing her medication schedule as she finds it confusing at times. No changes to  regimen at this time. Refills to be sent to pharmacy as needed.   All questions answered and support provided.  I discussed the importance of continued conversation with family and their medical providers regarding overall plan of care and treatment options, ensuring decisions are within the context of the patients values and GOCs.  Assessment and Plan  Chronic Cancer Related Pain Moderate to severe pain reported at times, but patient is satisfied with current pain management regimen. No issues with constipation or diarrhea. -Continue current pain management regimen with Oxycodone 5mg  PRN and Oxycodone ER 13.5mg   every 12 hours. -Refill Oxycodone prescriptions, due on October 24, 2023.  Weight Gain Patient reports weight gain and increased appetite with use of steroids. -Monitor weight and appetite changes.  General Health Maintenance -Continue current medications and monitor for any side effects. -Send prescriptions refills as needed.  -I will plan to follow-up with patient in 3-4 weeks.  Patient expressed understanding and was in agreement with this plan. She also understands that She can call the clinic at any time with  any questions, concerns, or complaints.   Any controlled substances utilized were prescribed in the context of palliative care. PDMP has been reviewed.   Visit consisted of counseling and education dealing with the complex and emotionally intense issues of symptom management and palliative care in the setting of serious and potentially life-threatening illness.  Willette Alma, AGPCNP-BC  Palliative Medicine Team/[facility]

## 2023-11-26 NOTE — Telephone Encounter (Signed)
 Left message for patient to call back

## 2023-11-27 ENCOUNTER — Inpatient Hospital Stay

## 2023-11-27 ENCOUNTER — Ambulatory Visit: Payer: Medicare Other | Admitting: Physician Assistant

## 2023-11-27 ENCOUNTER — Other Ambulatory Visit: Payer: Medicare Other

## 2023-11-27 ENCOUNTER — Inpatient Hospital Stay (HOSPITAL_BASED_OUTPATIENT_CLINIC_OR_DEPARTMENT_OTHER): Admitting: Nurse Practitioner

## 2023-11-27 ENCOUNTER — Ambulatory Visit: Payer: Medicare Other

## 2023-11-27 ENCOUNTER — Ambulatory Visit

## 2023-11-27 ENCOUNTER — Inpatient Hospital Stay (HOSPITAL_BASED_OUTPATIENT_CLINIC_OR_DEPARTMENT_OTHER): Admitting: Hematology and Oncology

## 2023-11-27 ENCOUNTER — Encounter: Payer: Self-pay | Admitting: Nurse Practitioner

## 2023-11-27 ENCOUNTER — Inpatient Hospital Stay: Attending: Hematology and Oncology

## 2023-11-27 VITALS — BP 120/94 | HR 83 | Temp 98.0°F | Resp 18 | Wt 171.7 lb

## 2023-11-27 DIAGNOSIS — R062 Wheezing: Secondary | ICD-10-CM | POA: Diagnosis not present

## 2023-11-27 DIAGNOSIS — J984 Other disorders of lung: Secondary | ICD-10-CM | POA: Insufficient documentation

## 2023-11-27 DIAGNOSIS — R42 Dizziness and giddiness: Secondary | ICD-10-CM | POA: Diagnosis not present

## 2023-11-27 DIAGNOSIS — Z5112 Encounter for antineoplastic immunotherapy: Secondary | ICD-10-CM | POA: Diagnosis present

## 2023-11-27 DIAGNOSIS — Z8673 Personal history of transient ischemic attack (TIA), and cerebral infarction without residual deficits: Secondary | ICD-10-CM | POA: Insufficient documentation

## 2023-11-27 DIAGNOSIS — Z86718 Personal history of other venous thrombosis and embolism: Secondary | ICD-10-CM | POA: Diagnosis not present

## 2023-11-27 DIAGNOSIS — G893 Neoplasm related pain (acute) (chronic): Secondary | ICD-10-CM

## 2023-11-27 DIAGNOSIS — R0602 Shortness of breath: Secondary | ICD-10-CM | POA: Diagnosis not present

## 2023-11-27 DIAGNOSIS — C7931 Secondary malignant neoplasm of brain: Secondary | ICD-10-CM

## 2023-11-27 DIAGNOSIS — Z90710 Acquired absence of both cervix and uterus: Secondary | ICD-10-CM | POA: Diagnosis not present

## 2023-11-27 DIAGNOSIS — I3139 Other pericardial effusion (noninflammatory): Secondary | ICD-10-CM | POA: Diagnosis not present

## 2023-11-27 DIAGNOSIS — K5730 Diverticulosis of large intestine without perforation or abscess without bleeding: Secondary | ICD-10-CM | POA: Diagnosis not present

## 2023-11-27 DIAGNOSIS — Z79899 Other long term (current) drug therapy: Secondary | ICD-10-CM | POA: Diagnosis not present

## 2023-11-27 DIAGNOSIS — K5903 Drug induced constipation: Secondary | ICD-10-CM

## 2023-11-27 DIAGNOSIS — I700 Atherosclerosis of aorta: Secondary | ICD-10-CM | POA: Diagnosis not present

## 2023-11-27 DIAGNOSIS — Z87891 Personal history of nicotine dependence: Secondary | ICD-10-CM | POA: Diagnosis not present

## 2023-11-27 DIAGNOSIS — R918 Other nonspecific abnormal finding of lung field: Secondary | ICD-10-CM

## 2023-11-27 DIAGNOSIS — C3490 Malignant neoplasm of unspecified part of unspecified bronchus or lung: Secondary | ICD-10-CM | POA: Diagnosis not present

## 2023-11-27 DIAGNOSIS — C7951 Secondary malignant neoplasm of bone: Secondary | ICD-10-CM

## 2023-11-27 DIAGNOSIS — C3431 Malignant neoplasm of lower lobe, right bronchus or lung: Secondary | ICD-10-CM | POA: Diagnosis present

## 2023-11-27 DIAGNOSIS — R0989 Other specified symptoms and signs involving the circulatory and respiratory systems: Secondary | ICD-10-CM | POA: Diagnosis not present

## 2023-11-27 DIAGNOSIS — Z9049 Acquired absence of other specified parts of digestive tract: Secondary | ICD-10-CM | POA: Diagnosis not present

## 2023-11-27 DIAGNOSIS — E876 Hypokalemia: Secondary | ICD-10-CM | POA: Insufficient documentation

## 2023-11-27 DIAGNOSIS — K5900 Constipation, unspecified: Secondary | ICD-10-CM | POA: Diagnosis not present

## 2023-11-27 DIAGNOSIS — S2220XA Unspecified fracture of sternum, initial encounter for closed fracture: Secondary | ICD-10-CM | POA: Diagnosis not present

## 2023-11-27 DIAGNOSIS — Z515 Encounter for palliative care: Secondary | ICD-10-CM | POA: Diagnosis not present

## 2023-11-27 DIAGNOSIS — Z7901 Long term (current) use of anticoagulants: Secondary | ICD-10-CM | POA: Diagnosis not present

## 2023-11-27 LAB — CBC WITH DIFFERENTIAL (CANCER CENTER ONLY)
Abs Immature Granulocytes: 0.18 10*3/uL — ABNORMAL HIGH (ref 0.00–0.07)
Basophils Absolute: 0.1 10*3/uL (ref 0.0–0.1)
Basophils Relative: 1 %
Eosinophils Absolute: 0.2 10*3/uL (ref 0.0–0.5)
Eosinophils Relative: 1 %
HCT: 40 % (ref 36.0–46.0)
Hemoglobin: 13 g/dL (ref 12.0–15.0)
Immature Granulocytes: 2 %
Lymphocytes Relative: 30 %
Lymphs Abs: 3.3 10*3/uL (ref 0.7–4.0)
MCH: 32.7 pg (ref 26.0–34.0)
MCHC: 32.5 g/dL (ref 30.0–36.0)
MCV: 100.8 fL — ABNORMAL HIGH (ref 80.0–100.0)
Monocytes Absolute: 1.2 10*3/uL — ABNORMAL HIGH (ref 0.1–1.0)
Monocytes Relative: 11 %
Neutro Abs: 6 10*3/uL (ref 1.7–7.7)
Neutrophils Relative %: 55 %
Platelet Count: 250 10*3/uL (ref 150–400)
RBC: 3.97 MIL/uL (ref 3.87–5.11)
RDW: 13.1 % (ref 11.5–15.5)
WBC Count: 10.9 10*3/uL — ABNORMAL HIGH (ref 4.0–10.5)
nRBC: 0 % (ref 0.0–0.2)

## 2023-11-27 LAB — TSH: TSH: 0.738 u[IU]/mL (ref 0.350–4.500)

## 2023-11-27 LAB — CMP (CANCER CENTER ONLY)
ALT: 14 U/L (ref 0–44)
AST: 13 U/L — ABNORMAL LOW (ref 15–41)
Albumin: 3.7 g/dL (ref 3.5–5.0)
Alkaline Phosphatase: 41 U/L (ref 38–126)
Anion gap: 6 (ref 5–15)
BUN: 47 mg/dL — ABNORMAL HIGH (ref 8–23)
CO2: 30 mmol/L (ref 22–32)
Calcium: 9.5 mg/dL (ref 8.9–10.3)
Chloride: 104 mmol/L (ref 98–111)
Creatinine: 0.95 mg/dL (ref 0.44–1.00)
GFR, Estimated: >60 mL/min (ref >60)
Glucose, Bld: 92 mg/dL (ref 70–99)
Potassium: 3.8 mmol/L (ref 3.5–5.1)
Sodium: 140 mmol/L (ref 135–145)
Total Bilirubin: 0.3 mg/dL (ref 0.0–1.2)
Total Protein: 6.8 g/dL (ref 6.5–8.1)

## 2023-11-27 MED ORDER — PROCHLORPERAZINE MALEATE 10 MG PO TABS
10.0000 mg | ORAL_TABLET | Freq: Once | ORAL | Status: AC
  Administered 2023-11-27: 10 mg via ORAL
  Filled 2023-11-27: qty 1

## 2023-11-27 MED ORDER — SODIUM CHLORIDE 0.9 % IV SOLN: Freq: Once | INTRAVENOUS | Status: AC

## 2023-11-27 MED ORDER — SODIUM CHLORIDE 0.9 % IV SOLN: 500.0000 mg/m2 | Freq: Once | INTRAVENOUS | Status: DC

## 2023-11-27 MED ORDER — SODIUM CHLORIDE 0.9 % IV SOLN
200.0000 mg | Freq: Once | INTRAVENOUS | Status: AC
  Administered 2023-11-27: 200 mg via INTRAVENOUS
  Filled 2023-11-27: qty 200

## 2023-11-27 NOTE — Progress Notes (Signed)
 Continue to hold Alimta per Dr. [name].   Drusilla Kanner, PharmD, MBA

## 2023-11-27 NOTE — Progress Notes (Signed)
 [facility] [phone]   [phone]  PROGRESS NOTE  Patient Care Team: Virginia Mackintosh, MD as PCP - General (Internal Medicine) Virginia Standard, MD as PCP - Hematology/Oncology (Hematology and Oncology) Virginia Overman, RN as Oncology Nurse Navigator Virginia Petrin, DO as Consulting Physician [facility]) [facility], Dory Peru, MD as Rounding Team (Cardiology) Virginia [name], Virginia Baumgartner, NP as Nurse Practitioner (Hospice and Palliative Medicine)  Hematological/Oncological History # 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 CT of the head showed showed concerning for metastatic disease involving the left cerebellum and right occipital lobe.  2) 09/30/2019: MRI brain confirms mass within the left cerebellum measures 1.6 x 1.3 x 1.5 cm as well as at least 8 metastatic lesions within the supratentorial and infratentorial brain as outlined 3) 10/01/2019: PET CT scan revealed hypermetabolic infrahilar right lower lobe mass with hypermetabolic nodal metastases in the right hilum, mediastinum, right supraclavicular region, left axilla and lower left neck. 4) 10/08/2019: US guided biopsy of the lymph nodes confirms poorly differentiated non-small cell carcinoma. Immunohistochemistry confirms adenocarcinoma of lung primary. PD-L1 and NGS later revealed a MET Exon 14 mutation and TPS of 90%.  5) 10/12/2019: Establish care with Virginia [name]  6) 11/04/2019: Day 1 of capmatinib 400mg  BID 7) 11/09/2019: presented to Rad/Onc with a DVT in LLE. Seen in symptom management clinic and started on apixaban.  8) 12/29/2019: CT C/A/P showed interval decrease in size of mass involving the superior segment of right lower lobe and reduction in mediastinal and left supraclavicular adenopathy though some small spinal lesions were noted in the interim between the two sets of  scans 9) 01/25/2020: temporarily stopped capmatinib due to symptoms of nausea/fatigue. Restarted therapy on 01/30/2020 after brief chemo holiday.  10) 03/22/2020: CT C/A/P reveals a mixed picture, with new lung nodule and increased lymphadenopathy, however response in bone lesions. D/c capmatinib therapy 11) 04/08/2020: start of Carbo/Pem/Pem for 2nd line treatment. Cycle 1 Day 1   12) 04/29/2020: Cycle 2 Day 1 Carbo/Pem/Pem  13) 05/20/2020: Cycle 3 Day 1 Carbo/Pem/Pem  14) 06/09/2020: Cycle 4 Day 1 Carbo/Pem/Pem  15) 06/29/2020: Cycle 5 Day 1 maintenance Pem/Pem  16) 07/20/2020: Cycle 6 Day 1 maintenance Pem/Pem  17) 08/12/2020: Cycle 7 Day 1 maintenance Pem/Pem  18) 09/01/2020: Cycle 8 Day 1 maintenance Pem/Pem  19) 09/21/2020: Cycle 9 Day 1 maintenance Pem/Pem  20) 10/13/2020: Cycle 10 Day 1 maintenance Pem/Pem  21) 11/03/2020: Cycle 11 Day 1 maintenance Pem/Pem  22) 11/25/2020:  Cycle 12 Day 1 maintenance Pem/Pem 23) 12/15/2020:  Cycle 13 Day 1 maintenance Pem/Pem 24) 01/05/2021:  Cycle 14 Day 1 maintenance Pem/Pem 25) 01/26/2021:  Cycle 15 Day 1 maintenance Pem/Pem plus Bevacizumab.  02/17/2021: Cycle 16 Day 1 maintenance Pem/Pem plus Bevacizumab.  03/10/2021: Cycle 17 Day 1 maintenance Pem/Pem plus Bevacizumab.  03/31/2021: Cycle 18 Day 1 maintenance Pem/Pem  04/20/2021:  Cycle 19 Day 1 maintenance Pem/Pem  05/12/2021: Cycle 20 Day 1 maintenance Pem/Pem 06/01/2021: Cycle 21 Day 1 maintenance Pem/Pem 06/22/2021: Cycle 22 Day 1 maintenance Pem/Pem 07/13/2021: Cycle 23 Day 1 maintenance Pem/Pem 08/16/2021: Cycle 24 Day 1 maintenance Pem/Pem 09/08/2021: Cycle 25 Day 1 maintenance Pem/Pem plus Bevacizumab 09/28/2021: Cycle 26 Day 1 maintenance Pem/Pem plus Bevacizumab 10/20/2021: Cycle 27 Day 1 maintenance Pem/Pem plus Bevacizumab 11/09/2021: Cycle 28 Day 1 maintenance Pem/Pem  12/06/2021: Cycle 29 Day 1 maintenance Pem/Pem  12/27/2021: Cycle  30 Day 1 maintenance Pem/Pem  01/18/2022: Cycle 31 Day 1 maintenance  Pem/Pem  02/07/2022: Cycle 32 Day 1 maintenance Pem/Pem  02/28/2022: Cycle 33 Day 1 maintenance Pem/Pem  03/23/2022: Cycle 34 Day 1 maintenance Pem/Pem  04/13/2022: Cycle 35 Day 1 maintenance Pem/Pem  05/04/2022: Cycle 36 Day 1 maintenance Pem/Pem  05/25/2022: Cycle 37 Day 1 maintenance Pem/Pem  06/14/2022: Cycle 38 Day 1 maintenance Pem/Pem 07/05/2022: Cycle 39 Day 1 maintenance Pem/Pem 08/13/2022: Cycle 40 Day 1 maintenance Pem/Pem 09/06/2022:Cycle 41 Day 1 maintenance Pem/Pem 09/27/2022: Cycle 42 Day 1 maintenance Pem/Pem 10/19/2022: Cycle 43 Day 1 maintenance Pem/Pem 11/09/2022: Cycle 44 Day 1 maintenance Pem/Pem 11/30/2022: Cycle 45 Day 1 maintenance Pem/Pem 12/20/2022: Cycle 46 Day 1 maintenance Pem/Pem 01/10/2023: Cycle 47 Day 1 maintenance Pem/Pem 01/31/2023:Cycle 48 Day 1 maintenance Pem/Pem 02/22/2023: Cycle 49 Day 1 maintenance Pem/Pem 03/15/2023: Cycle 50 Day 1 maintenance Pem/Pem 04/04/2023: Cycle 51 Day 1 maintenance Pem/Pem 04/26/2023: Cycle 52 Day 1 maintenance Pem/Pem 05/17/2023: Cycle 53 Day 1 maintenance Pem/Pem 07/19/2023: Cycle 54 Day 1 maintenance Pembro (holding Pemetrexed) 08/12/2023: Cycle 55 Day 1 maintenance Pem/Pem 09/02/2023: Cycle 56 Day 1 maintenance Pembro (holding Pemetrexed) 09/23/2023: Cycle 57 Day 1 maintenance Pembro (holding Pemetrexed) 10/15/2023: Cycle 58 Day 1 maintenance Pembro (holding Pemetrexed)  Interval History:  Virginia [name] 68 y.o. female with medical history significant for metastatic adenocarcinoma of the lung presents for a follow up visit. The patient's last visit was on 10/15/2023.  On exam today Virginia [name] reports she has been well overall interim since her last visit.  She reports her energy levels are good and her strength is improved.  She reports that she has been eating well.  She is having continued dizziness and she reports that is still her biggest issue.  He is also having some occasional bouts of shortness of breath.  She has some rattling  and wheezing which is not present on our exam today.  She reports that she has not had any issues with viral illness such as flu, COVID, or norovirus.  She reports she is taking an average of 2-3 oxycodone per day, but some days can take up to 5.  Overall she is willing and able to continue with chemotherapy at this time. She denies any fevers, chills, sweats or chest pain. She has no other complaints.  Full 10 point ROS was otherwise negative  MEDICAL HISTORY:  Past Medical History:  Diagnosis Date   Anemia    Anxiety    Concussion 09/28/2019   DVT (deep venous thrombosis) (HCC) 2021   L leg   Dyspnea    GERD (gastroesophageal reflux disease)    Hypercholesterolemia    per pt, she does not have elevated lipids   Hypertension    met lung ca dx'd 09/2019   mets to spine, hip and brain   PONV (postoperative nausea and vomiting)    Tobacco abuse     SURGICAL HISTORY: Past Surgical History:  Procedure Laterality Date   ABDOMINAL HYSTERECTOMY     partial/ left ovaries   APPLICATION OF CRANIAL NAVIGATION N/A 12/02/2020   Procedure: APPLICATION OF CRANIAL NAVIGATION;  Surgeon: Maeola Harman, MD;  Location: [facility] OR;  Service: Neurosurgery;  Laterality: N/A;   CHOLECYSTECTOMY     CRANIOTOMY N/A 12/02/2020   Procedure: Posterior fossa craniotomy for tumor resection with brainlab;  Surgeon: Maeola Harman, MD;  Location: [facility] OR;  Service: Neurosurgery;  Laterality: N/A;   DILATION AND CURETTAGE OF UTERUS     IR IMAGING  GUIDED PORT INSERTION  10/23/2019   IR PATIENT EVAL TECH 0-60 MINS  11/21/2021   IR PATIENT EVAL TECH 0-60 MINS  11/24/2021   IR PATIENT EVAL TECH 0-60 MINS  11/29/2021   IR PATIENT EVAL TECH 0-60 MINS  12/04/2021   IR PATIENT EVAL TECH 0-60 MINS  12/12/2021   IR PATIENT EVAL TECH 0-60 MINS  12/19/2021   IR PATIENT EVAL TECH 0-60 MINS  12/27/2021   IR PATIENT EVAL TECH 0-60 MINS  01/03/2022   IR PATIENT EVAL TECH 0-60 MINS  01/10/2022   IR PATIENT EVAL TECH 0-60 MINS  01/18/2022   IR  PATIENT EVAL TECH 0-60 MINS  02/13/2022   IR PATIENT EVAL TECH 0-60 MINS  02/20/2022   IR RADIOLOGIST EVAL & MGMT  01/23/2022   IR REMOVAL TUN ACCESS W/ PORT W/O FL MOD SED  11/17/2021   KYPHOPLASTY N/A 03/15/2020   Procedure: Thoracic Eight KYPHOPLASTY;  Surgeon: Maeola Harman, MD;  Location: [facility] OR;  Service: Neurosurgery;  Laterality: N/A;  prone    LIPOMA EXCISION  2018   removed under left breast and right thigh.   TUBAL LIGATION      ALLERGIES:  is allergic to scopolamine.  MEDICATIONS:  Current Outpatient Medications  Medication Sig Dispense Refill   acetaminophen (TYLENOL) 325 MG tablet Take 1-2 tablets (325-650 mg total) by mouth every 4 (four) hours as needed for mild pain (pain score 1-3).     albuterol (VENTOLIN HFA) 108 (90 Base) MCG/ACT inhaler INHALE 2 PUFFS INTO THE LUNGS EVERY 6 HOURS AS NEEDED FOR WHEEZING OR SHORTNESS OF BREATH 6.7 g 11   ALPRAZolam (XANAX) 0.25 MG tablet Take 1 tablet (0.25 mg total) by mouth 2 (two) times daily as needed for anxiety. 30 tablet 0   apixaban (ELIQUIS) 5 MG TABS tablet Take 1 tablet (5 mg total) by mouth 2 (two) times daily. 180 tablet 1   Cyanocobalamin (VITAMIN B12 PO) Take 1 tablet by mouth daily. Gummies     dexamethasone (DECADRON) 1 MG tablet Take 1 tablet (1 mg total) by mouth daily. 90 tablet 0   feeding supplement (ENSURE ENLIVE / ENSURE PLUS) LIQD Take 237 mLs by mouth 2 (two) times daily between meals.     furosemide (LASIX) 20 MG tablet Take 1 tablet (20 mg total) by mouth daily. 90 tablet 3   hydrocortisone (ANUSOL-HC) 2.5 % rectal cream use in rectal area 2 to 3 times a day as needed 30 g PRN   levETIRAcetam (KEPPRA) 1000 MG tablet Take 1 tablet (1,000 mg total) by mouth 2 (two) times daily. 90 tablet 2   magnesium oxide (MAG-OX) 400 (240 Mg) MG tablet Take 1 tablet (400 mg total) by mouth 2 (two) times daily. 60 tablet 0   meclizine (ANTIVERT) 50 MG tablet Take 1 tablet (50 mg total) by mouth 3 (three) times daily. 90 tablet 2    metoprolol succinate (TOPROL-XL) 25 MG 24 hr tablet Take 1 tablet (25 mg total) by mouth daily. 90 tablet 3   Multiple Vitamin (MULTIVITAMIN WITH MINERALS) TABS tablet Take 1 tablet by mouth daily.     Nutritional Supplements (,FEEDING SUPPLEMENT, PROSOURCE PLUS) liquid Take 30 mLs by mouth 2 (two) times daily between meals.     OLANZapine (ZYPREXA) 2.5 MG tablet Take 1 tablet (2.5 mg total) by mouth at bedtime. 90 tablet 1   ondansetron (ZOFRAN) 4 MG tablet TAKE 1 TABLET(4 MG) BY MOUTH EVERY 8 HOURS AS NEEDED FOR NAUSEA OR VOMITING 40  tablet 2   OVER THE COUNTER MEDICATION Take 2 tablets by mouth daily. Beet root     oxyCODONE (OXY IR/ROXICODONE) 5 MG immediate release tablet Take 1 tablet (5 mg total) by mouth every 4 (four) hours as needed for severe pain (pain score 7-10). 90 tablet 0   pantoprazole (PROTONIX) 40 MG tablet TAKE 1 TABLET(40 MG) BY MOUTH DAILY 90 tablet 3   polyethylene glycol (MIRALAX / GLYCOLAX) 17 g packet Take 17 g by mouth every evening.     potassium chloride SA (KLOR-CON M) 20 MEQ tablet Take 1 tablet (20 mEq total) by mouth 2 (two) times daily. 90 tablet 1   senna-docusate (SENOKOT-S) 8.6-50 MG tablet Take 2 tablets by mouth daily as needed (Patient preference, Sennakot S for mild constipation). (Patient taking differently: Take 2 tablets by mouth daily as needed for mild constipation.)     spironolactone (ALDACTONE) 25 MG tablet Take 1 tablet (25 mg total) by mouth daily. 90 tablet 3   VITAMIN D PO Take 1 capsule by mouth every evening.     XTAMPZA ER 13.5 MG C12A Take 1 capsule by mouth in the morning and at bedtime. 60 capsule 0   No current facility-administered medications for this visit.   Facility-Administered Medications Ordered in Other Visits  Medication Dose Route Frequency Provider Last Rate Last Admin   pembrolizumab (KEYTRUDA) 200 mg in sodium chloride 0.9 % 50 mL chemo infusion  200 mg Intravenous Once Virginia Standard, MD        REVIEW OF SYSTEMS:    Constitutional: ( - ) fevers, ( - )  chills , ( - ) night sweats Eyes: ( - ) blurriness of vision, ( - ) double vision, ( - ) watery eyes Ears, nose, mouth, throat, and face: ( - ) mucositis, ( - ) sore throat Respiratory: ( - ) cough, ( + ) dyspnea, ( - ) wheezes Cardiovascular: ( - ) palpitation, ( - ) chest discomfort, ( - ) lower extremity swelling Gastrointestinal:  ( + ) nausea, ( - ) heartburn, ( - ) change in bowel habits Skin: ( - ) abnormal skin rashes Lymphatics: ( - ) new lymphadenopathy, ( - ) easy bruising Neurological: ( - ) numbness, ( - ) tingling, ( - ) new weaknesses Behavioral/Psych: ( - ) mood change, ( - ) new changes  All other systems were reviewed with the patient and are negative.  PHYSICAL EXAMINATION: ECOG PERFORMANCE STATUS: 1 - Symptomatic but completely ambulatory  Vitals:   11/27/23 1421  BP: (!) 120/94  Pulse: 83  Resp: 18  Temp: 98 F (36.7 C)  SpO2: 99%   Filed Weights   11/27/23 1421  Weight: 171 lb 11.2 oz (77.9 kg)    GENERAL: well appearing middle aged Caucasian female in NAD  SKIN: skin color, texture, turgor are normal, no rashes or significant lesions EYES: conjunctiva are pink and non-injected, sclera clear LUNGS: wheezing at lung bases. clear to auscultation and percussion with normal breathing effort HEART: regular rate & rhythm and no murmurs and bilateral trace pitting lower extremity edema Musculoskeletal: no cyanosis of digits and no clubbing PSYCH: alert & oriented x 3, fluent speech NEURO: no focal motor/sensory deficits  LABORATORY DATA:  I have reviewed the data as listed    Latest Ref Rng & Units 11/27/2023    1:46 PM 10/15/2023   12:54 PM 09/23/2023   10:46 AM  CBC  WBC 4.0 - 10.5 K/uL 10.9  9.3  11.5   Hemoglobin 12.0 - 15.0 g/dL 16.1  09.6  04.5   Hematocrit 36.0 - 46.0 % 40.0  36.2  34.6   Platelets 150 - 400 K/uL 250  238  215        Latest Ref Rng & Units 11/27/2023    1:46 PM 10/15/2023   12:54 PM 09/23/2023    10:46 AM  CMP  Glucose 70 - 99 mg/dL 92  409  811   BUN 8 - 23 mg/dL 47  50  51   Creatinine 0.44 - 1.00 mg/dL 9.14  7.82  9.56   Sodium 135 - 145 mmol/L 140  141  139   Potassium 3.5 - 5.1 mmol/L 3.8  4.3  4.3   Chloride 98 - 111 mmol/L 104  108  105   CO2 22 - 32 mmol/L 30  27  30    Calcium 8.9 - 10.3 mg/dL 9.5  9.4  9.4   Total Protein 6.5 - 8.1 g/dL 6.8  6.3  6.4   Total Bilirubin 0.0 - 1.2 mg/dL 0.3  0.3  0.2   Alkaline Phos 38 - 126 U/L 41  35  42   AST 15 - 41 U/L 13  16  13    ALT 0 - 44 U/L 14  23  11       RADIOGRAPHIC STUDIES: I personally have viewed the radiographic studies below: ECHOCARDIOGRAM COMPLETE Result Date: 11/19/2023    ECHOCARDIOGRAM REPORT   Patient Name:   MYLYNN DINH Date of Exam: 11/19/2023 Medical Rec #:  [id number]         Height:       62.0 in Accession #:    [id number]        Weight:       168.4 lb Date of Birth:  12-Oct-1955        BSA:          1.777 m Patient Age:    67 years          BP:           124/88 mmHg Patient Gender: F                 HR:           71 bpm. Exam Location:  [address] Procedure: 2D Echo, Cardiac Doppler and Color Doppler (Both Spectral and Color            Flow Doppler were utilized during procedure). Indications:    I42.9 Cardiomyopathy  History:        Patient has prior history of Echocardiogram examinations, most                 recent 06/06/2023. Signs/Symptoms:Dyspnea; Risk                 Factors:Hypertension and Dyslipidemia. Cardiac Arrest.  Sonographer:    Daphine Deutscher RDCS Referring Phys: [id number] Orbie Pyo  Sonographer Comments: DOD (Dr Ladona Ridgel) notified of patients pericardial effusion. No definity in stock for contrast echo. IMPRESSIONS  1. Left ventricular ejection fraction, by estimation, is 50 to 55%. The left ventricle has low normal function. The left ventricle has no regional wall motion abnormalities. Left ventricular diastolic parameters are consistent with Grade I diastolic dysfunction  (impaired relaxation).  2. Right ventricular systolic function is normal. The right ventricular size is normal.  3. Large pericardial effusion. The pericardial effusion is circumferential.  4. The mitral valve is normal in structure. No  evidence of mitral valve regurgitation. No evidence of mitral stenosis.  5. The aortic valve is tricuspid. There is moderate calcification of the aortic valve. Aortic valve regurgitation is mild. Aortic valve sclerosis/calcification is present, without any evidence of aortic stenosis.  6. The inferior vena cava is normal in size with greater than 50% respiratory variability, suggesting right atrial pressure of 3 mmHg. Conclusion(s)/Recommendation(s): There is a large complex circumfrential periacardial effusion with very mild compression of the RV but no significant respiratory variation in mitral inflow velocity. IVC is normal size. No evidence of tamponade. Effusion  has increased significantly since previous. Clinical team aware. FINDINGS  Left Ventricle: Left ventricular ejection fraction, by estimation, is 50 to 55%. The left ventricle has low normal function. The left ventricle has no regional wall motion abnormalities. The left ventricular internal cavity size was normal in size. There is no left ventricular hypertrophy. Left ventricular diastolic parameters are consistent with Grade I diastolic dysfunction (impaired relaxation). Right Ventricle: The right ventricular size is normal. No increase in right ventricular wall thickness. Right ventricular systolic function is normal. Left Atrium: Left atrial size was normal in size. Right Atrium: Right atrial size was normal in size. Pericardium: A large pericardial effusion is present. The pericardial effusion is circumferential. Thickening/calcification of pericardium present. Mitral Valve: The mitral valve is normal in structure. There is mild calcification of the mitral valve leaflet(s). Mild mitral annular calcification. No  evidence of mitral valve regurgitation. No evidence of mitral valve stenosis. Tricuspid Valve: The tricuspid valve is normal in structure. Tricuspid valve regurgitation is not demonstrated. No evidence of tricuspid stenosis. Aortic Valve: The aortic valve is tricuspid. There is moderate calcification of the aortic valve. Aortic valve regurgitation is mild. Aortic valve sclerosis/calcification is present, without any evidence of aortic stenosis. Pulmonic Valve: The pulmonic valve was normal in structure. Pulmonic valve regurgitation is not visualized. No evidence of pulmonic stenosis. Aorta: The aortic root is normal in size and structure. Venous: The inferior vena cava is normal in size with greater than 50% respiratory variability, suggesting right atrial pressure of 3 mmHg. IAS/Shunts: No atrial level shunt detected by color flow Doppler.  LEFT VENTRICLE PLAX 2D LVIDd:         4.20 cm   Diastology LVIDs:         2.80 cm   LV e' medial:    4.73 cm/s LV PW:         0.90 cm   LV E/e' medial:  15.5 LV IVS:        0.90 cm   LV e' lateral:   7.40 cm/s LVOT diam:     2.00 cm   LV E/e' lateral: 9.9 LV SV:         47 LV SV Index:   26 LVOT Area:     3.14 cm  RIGHT VENTRICLE             IVC RV Basal diam:  3.30 cm     IVC diam: 1.40 cm RV S prime:     10.80 cm/s TAPSE (M-mode): 2.4 cm LEFT ATRIUM             Index        RIGHT ATRIUM           Index LA diam:        3.80 cm 2.14 cm/m   RA Area:     11.30 cm LA Vol (A2C):   42.7 ml 24.03 ml/m  RA Volume:  26.50 ml  14.91 ml/m LA Vol (A4C):   47.7 ml 26.84 ml/m LA Biplane Vol: 46.2 ml 26.00 ml/m  AORTIC VALVE LVOT Vmax:   68.40 cm/s LVOT Vmean:  47.150 cm/s LVOT VTI:    0.149 m  AORTA Ao Root diam: 3.60 cm Ao Asc diam:  3.70 cm MITRAL VALVE MV Area (PHT): 3.29 cm     SHUNTS MV Decel Time: 231 msec     Systemic VTI:  0.15 m MV E velocity: 73.35 cm/s   Systemic Diam: 2.00 cm MV A velocity: 104.00 cm/s MV E/A ratio:  0.71 Arvilla Meres MD Electronically signed by  Arvilla Meres MD Signature Date/Time: 11/19/2023/2:25:50 PM    Final    CT CHEST ABDOMEN PELVIS W CONTRAST Result Date: 11/01/2023 CLINICAL DATA:  Non-small-cell lung cancer, restaging. * Tracking Code: BO * EXAM: CT CHEST, ABDOMEN, AND PELVIS WITH CONTRAST TECHNIQUE: Multidetector CT imaging of the chest, abdomen and pelvis was performed following the [name] protocol during bolus administration of intravenous contrast. RADIATION DOSE REDUCTION: This exam was performed according to the departmental dose-optimization program which includes automated exposure control, adjustment of the mA and/or kV according to patient size and/or use of iterative reconstruction technique. CONTRAST:  OMNIPAQUE IOHEXOL 300 MG/ML  SOLN COMPARISON:  Multiple priors including most recent CT August 23, 2023 FINDINGS: CT CHEST FINDINGS Cardiovascular: Aortic atherosclerosis. Normal size heart. Increased size of the large pericardial effusion. Mediastinum/Nodes: No suspicious thyroid nodule. No pathologically enlarged mediastinal, hilar or axillary lymph nodes. The esophagus is grossly unremarkable. Lungs/Pleura: Similar appearance of the bandlike scarring in the bilateral lung bases and scattered small nodular opacities in the apices for instance the irregular opacity in the posterior right lung apex measuring 1 cm on image 36/5, unchanged. Slight decreased size of a spiculated nodule in the infrahilar right lower lobe now measuring 10 x 5 mm on image 80/5 previously 11 x 7 mm. Otherwise similar posttreatment appearance of the perihilar and infrahilar right lung. Musculoskeletal: No aggressive lytic or blastic lesion of bone. T8 vertebroplasty. Probable postradiation change in the thoracic spine. Slight progression of the irregular sclerosis in the midsternum on image 97/7 with step-off of the anterior cortex compatible with sclerosis about a nondisplaced sternal fracture. Fracture is better visualized only in retrospect on  prior CT June 03, 2023 post cardiopulmonary resuscitation. Healing left fourth and fifth as well as right third and fourth rib fractures. CT ABDOMEN PELVIS FINDINGS Hepatobiliary: No suspicious hepatic lesion. Gallbladder surgically absent. Similar prominence of the biliary tree favored reservoir effect post cholecystectomy. Pancreas: No pancreatic ductal dilation or evidence of acute inflammation. Spleen: No splenomegaly. Adrenals/Urinary Tract: Bilateral adrenal glands appear normal. No hydronephrosis. Kidneys demonstrate symmetric enhancement. Urinary bladder is unremarkable for degree of distension. Stomach/Bowel: No pathologic dilation of small or large bowel. Colonic diverticulosis. Vascular/Lymphatic: Aortic atherosclerosis. Normal caliber abdominal aorta. Smooth IVC contours. No pathologically enlarged abdominal or pelvic lymph nodes. Reproductive: Status post hysterectomy. No adnexal masses. Other: No significant abdominopelvic free fluid. Musculoskeletal: Stable scattered areas of subtle sclerosis for instance in the superior aspect of the L2 vertebral body. No new aggressive lytic or blastic lesion of bone. Left acetabular bone island. Multilevel degenerative change of the spine. Degenerative change of the bilateral hips. IMPRESSION: 1. Slight decreased size of a spiculated nodule in the infrahilar right lower lobe now measuring 10 x 5 mm. Suggest continued attention on follow-up imaging. Otherwise similar posttreatment appearance of the perihilar and infrahilar right lung. 2. Multiple solid and ground-glass  pulmonary nodules largest is an irregular nodule in the posterior right lung apex these are stable from prior but nonspecific. Suggest continued attention on follow-up imaging. 3. No evidence of metastatic disease in the abdomen or pelvis. 4. Increased size of the large pericardial effusion. Suggest further evaluation with echocardiography. 5. Healing sternal fracture and bilateral rib fractures.  6.  Aortic Atherosclerosis (ICD10-I70.0). Electronically Signed   By: Maudry Mayhew M.D.   On: 11/01/2023 16:14     ASSESSMENT & PLAN KARMELLA BOUVIER is a 68 y.o. female with medical history significant for metastatic adenocarcinoma of the lung presents for a follow up visit. 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   Previously we discussed Carboplatin/Pembrolizumab/Pemetrexed and the expected side effect from these medications.  We discussed the possible side effects of immunotherapy including colitis, hepatitis, dermatitis, and pneumonitis.  Additionally we discussed the side effects of carboplatin which include suppression of blood counts, nephrotoxicity, and nausea.  Additionally we discussed pemetrexed which can also have effects on counts and would require supplementation with vitamin B12 and folic acid.  She voiced her understanding of these side effects and treatment plans moving forward.  I also noted that we would be able to drop the chemotherapy agents that she had intolerance to continue with pembrolizumab alone given her high TPS score.  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 Due to worsening dizziness, she resumed this from Cycle 25-27.  --Most recent CT CAP from 02/16/2023 showed stable disease.  PLAN: --Due for Cycle 59, Day 1 of maintenance Pem/Pem today --Labs from today were reviewed and adequate for treatment.  Labs today show white blood cell count white blood cell 10.9, hemoglobin 13.0, MCV 100.8, platelets 250 --Proceed with treatment today but will hold the pemetrexed.  Will proceed with pembrolizumab alone. --Last CT scan in Feb 2025 showed no evidence of progressive disease.  Repeat CT scan due May 2025. --RTC q 3 weeks  for labs, follow up visit prior to Cycle 60.    #Brain Metastasis, stable --appreciate the assistance of Dr. Barbaraann Cao in treating her brain metastasis and symptoms. --radiation therapy to the brain completed on 11/16/2019.  --MRI brain on 04/29/2020 showed evidence of small brain bleed. Eliquis therapy was discontinued.  --MRI on 11/04/2020 showed concern for progression of intracranial disease.  --On 12/02/2020, patient underwent craniotomy, resection of progressive cerebellar mass by Dr. Venetia Maxon. 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  --MRI on 12/23/2020 showed evidence of radiation necrosis. Dr. Barbaraann Cao evaluated the patient on 01/02/2021 with recommendations to add IV Bevacizumab 10 mg/kg q 3 weeks ( started 01/26/2021). Completed a full course of this therapy. --Patient was evaluated by Dr. Barbaraann Cao on 08/29/2021 and recommended to resume Bevacizumab due to worsening dizziness.  Which was given from Cycle 25-27.  Plan: --continue to follow with Dr. Barbaraann Cao  --Most recent MRI from 10/08/2023 demonstrates stable findings overall after some progression noted on December study (prior to PE).   #Nausea/Vomiting, worsening --Regimen includes Zofran 4-8mg  q8h PRN and compazine 10mg  q6H for breakthrough PRN, Olanzpaine 2.5 mg PO QHS to help with nausea.  She is currently using Phenergan for breakthrough. --continue to monitor   #Hypokalemia --likely 2/2 to decreased PO intake, hypovolemia, and BP medications --Potassium level 3.8 today.   --  Currently on potassium chloride tablets, 40 mEq PO potassium in AM and in PM.   #Pain Control, stable --continue oxycodone 5mg  q4H PRN. Can increase dose to 10mg  q6H if pain is severe. She is taking approximately 3-4 pills per day.  --continue Xtampza 13.5 mg ER BID for long acting support.  --continue to take with Senokot to prevent opioid induced constipation. Miralax PRN   #Supportive Therapy --continue EMLA cream with port --zometa 4g IV q 12 weeks for  bone metastasis, dental clearance received. Next dose due in March 2025.    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    # Hypertension: --Continue Norvasc to 10 mg daily.  --continue to monitor at home and in clinic.    Orders Placed This Encounter  Procedures   TSH    Standing Status:   Future    Expected Date:   01/08/2024    Expiration Date:   01/07/2025   CBC with Differential (Cancer Center Only)    Standing Status:   Future    Expected Date:   01/08/2024    Expiration Date:   01/07/2025   CMP (Cancer Center only)    Standing Status:   Future    Expected Date:   01/08/2024    Expiration Date:   01/07/2025    All questions were answered. The patient knows to call the clinic with any problems, questions or concerns.  I have spent a total of 30 minutes minutes of face-to-face and non-face-to-face time, preparing to see the patient, performing a medically appropriate examination, counseling and educating the patient,  documenting clinical information in the electronic health record, and care coordination.   Ulysees Barns, MD Department of Hematology/Oncology [facility] Phone: [phone] Pager: [phone] Email: [email or web address]    11/27/2023 4:16 PM   Literature Support:  Lynn Ito, Rodrguez-Abreu D, Naaman Plummer, Felip E, De Angelis F, Domine M, [place], Hochmair MJ, [place], [place], [address], [place], Grossi F, [place], Reck M, [place], [place], [place], Rubio-Viqueira B, Novello S, Kurata T, Gray JE, Vida J, Wei Z, Yang J, Raftopoulos H, [place], [place] [employer]; [id number] Investigators. Pembrolizumab plus Chemotherapy in Metastatic Non-Small-Cell Lung Cancer. Malva Limes Med. 2018 May 31;378(22):2078-2092.  --Median progression-free survival was 8.8 months (95% CI,  7.6 to 9.2) in the pembrolizumab-combination group and 4.9 months (95% CI, 4.7 to 5.5) in the placebo-combination group (hazard ratio for disease progression or death, 0.52; 95% CI, 0.43 to 0.64; P<0.001). Adverse events of grade 3 or higher occurred in 67.2% of the patients in the pembrolizumab-combination group and in 65.8% of those in the placebo-combination group.  Cristal Generous L. Efficacy of pemetrexed-based regimens in advanced non-small cell lung cancer patients with activating epidermal growth factor receptor mutations after tyrosine kinase inhibitor failure: a systematic review. Onco Targets Ther. 2018;11:2121-2129.   --The weighted median PFS, median OS, and ORR for patients treated with pem regimens were 5.09 months, 15.91 months, and 30.19%, respectively. Our systematic review results showed a favorable efficacy profile of pem regimens in NSCLC patients with EGFR mutation after EGFR-TKI failure.

## 2023-11-27 NOTE — Patient Instructions (Signed)
 [facility] - A DEPT OF [facility]  Discharge Instructions: Thank you for choosing [facility] to provide your oncology and hematology care.   If you have a lab appointment with the Cancer Center, please go directly to the Cancer Center and check in at the registration area.   Wear comfortable clothing and clothing appropriate for easy access to any Portacath or PICC line.   We strive to give you quality time with your provider. You may need to reschedule your appointment if you arrive late (15 or more minutes).  Arriving late affects you and other patients whose appointments are after yours.  Also, if you miss three or more appointments without notifying the office, you may be dismissed from the clinic at the provider's discretion.      For prescription refill requests, have your pharmacy contact our office and allow 72 hours for refills to be completed.    Today you received the following chemotherapy and/or immunotherapy agents: Keytruda      To help prevent nausea and vomiting after your treatment, we encourage you to take your nausea medication as directed.  BELOW ARE SYMPTOMS THAT SHOULD BE REPORTED IMMEDIATELY: *FEVER GREATER THAN 100.4 F (38 C) OR HIGHER *CHILLS OR SWEATING *NAUSEA AND VOMITING THAT IS NOT CONTROLLED WITH YOUR NAUSEA MEDICATION *UNUSUAL SHORTNESS OF BREATH *UNUSUAL BRUISING OR BLEEDING *URINARY PROBLEMS (pain or burning when urinating, or frequent urination) *BOWEL PROBLEMS (unusual diarrhea, constipation, pain near the anus) TENDERNESS IN MOUTH AND THROAT WITH OR WITHOUT PRESENCE OF ULCERS (sore throat, sores in mouth, or a toothache) UNUSUAL RASH, SWELLING OR PAIN  UNUSUAL VAGINAL DISCHARGE OR ITCHING   Items with * indicate a potential emergency and should be followed up as soon as possible or go to the Emergency Department if any problems should occur.  Please show the CHEMOTHERAPY ALERT CARD or IMMUNOTHERAPY  ALERT CARD at check-in to the Emergency Department and triage nurse.  Should you have questions after your visit or need to cancel or reschedule your appointment, please contact [facility] - A DEPT OF [facility]  Dept: [phone]  and follow the prompts.  Office hours are 8:00 a.m. to 4:30 p.m. Monday - Friday. Please note that voicemails left after 4:00 p.m. may not be returned until the following business day.  We are closed weekends and major holidays. You have access to a nurse at all times for urgent questions. Please call the main number to the clinic Dept: [phone] and follow the prompts.   For any non-urgent questions, you may also contact your provider using MyChart. We now offer e-Visits for anyone 84 and older to request care online for non-urgent symptoms. For details visit [email or web address].   Also download the MyChart app! Go to the app store, search "MyChart", open the app, select [place], and log in with your MyChart username and password.

## 2023-11-29 ENCOUNTER — Telehealth: Payer: Self-pay | Admitting: Internal Medicine

## 2023-11-29 NOTE — Telephone Encounter (Signed)
 Pt c/o swelling/edema: STAT if pt has developed SOB within 24 hours  If swelling, where is the swelling located? Face, ankles, feet , leg, stomach  How much weight have you gained and in what time span? 25 in the last two months   Have you gained 2 pounds in a day or 5 pounds in a week? N/A  Do you have a log of your daily weights (if so, list)? No   Are you currently taking a fluid pill? Yes   Are you currently SOB? No   Have you traveled recently in a car or plane for an extended period of time?   Pt husband would like for the pts dosage to be increased or just take more of the medication than once daily.   furosemide (LASIX) 20 MG tablet

## 2023-11-29 NOTE — Telephone Encounter (Signed)
 Spoke with husband per DPR and he states since being on the steroid patient is retaining more fluid.  States the extra fluid is also making her SOB. Denies any chest pain.  He would like to know if they can increase her lasix

## 2023-11-30 ENCOUNTER — Telehealth: Payer: Self-pay | Admitting: Student in an Organized Health Care Education/Training Program

## 2023-11-30 NOTE — Telephone Encounter (Signed)
 Paged by [name]' husband as her blood pressure is 155/106. They paged the on-call cardiologist to identify if anything should be done overnight. I instructed him that her home metoprolol succinate can help reduce her blood pressure when she takes it next, however I recommend they keep a daily blood pressure log and discuss with the PCP if blood pressures remain above 130/80.    Virginia Kea, MD MS  Cardiology

## 2023-12-03 ENCOUNTER — Encounter: Payer: Self-pay | Admitting: Hematology and Oncology

## 2023-12-03 NOTE — Telephone Encounter (Signed)
 Orbie Pyo, MD  Staley, Demetris R, LPN; Lendon Ka, RN Caller: Unspecified (4 days ago, 12:17 PM) Did she not see the DOD as I had requested?   Is there any way you can get her in with the DOD early next week?  I think it is fine to take the lasix 20mg  BID.  She has pericardial effusion.  If things get worse, she needs to go to the ED.  Called and spoke with husband Harvie Heck. Advised on increased Furosemide dosing. First available DOD slot at our office with Odis Hollingshead this Thursday, but husband states they have another appt in [place] that day and he doesn't believe their daughter would be able to get pt into and out of car. Checked [facility] office as well, but they don't have any DOD slots prior to the current appt on 12/09/23 that's already scheduled with [facility]. Izora Ribas is DOD this Friday 12/06/23 and had a cancellation on his template, so called to offer that, but no answer. Left detailed message asking that he call back as soon as possible if wanting to come in then. Awaiting call back.

## 2023-12-04 NOTE — Telephone Encounter (Signed)
 Patient's husband called back.  She has been scheduled with Dr. Izora Ribas (DOD) for Friday 12/06/23.

## 2023-12-05 ENCOUNTER — Ambulatory Visit: Admitting: Cardiology

## 2023-12-05 ENCOUNTER — Telehealth: Payer: Self-pay | Admitting: *Deleted

## 2023-12-05 NOTE — Telephone Encounter (Signed)
 Returned PC to patient's spouse, Virginia [name] - he called earlier about having patient's MRI moved up from May to April, Dr Barbaraann Cao is agreeable.  MRI appointment at [facility] changed to 12/18/23 at 11:00, they are to arrive at 10:30.  F/U appointment scheduled with Dr Barbaraann Cao, 12/26/23 at 2:30.  Virginia [name] also states that Virginia [name] is having increased dizziness, is more unstable as far as balance, & has a HA almost every day.  He is asking if she needs to hold her eliquis.  Dr Barbaraann Cao informed, he states if patient continues to have increase in symptoms, she needs to be evaluated at ED.  He does not recommend that she hold her eliquis at this time.  Virginia [name] informed of all information, he verbalizes understanding.

## 2023-12-06 ENCOUNTER — Encounter: Payer: Self-pay | Admitting: Internal Medicine

## 2023-12-06 ENCOUNTER — Ambulatory Visit: Attending: Internal Medicine | Admitting: Internal Medicine

## 2023-12-06 VITALS — BP 120/80 | HR 80 | Ht 62.0 in | Wt 171.0 lb

## 2023-12-06 DIAGNOSIS — Z86718 Personal history of other venous thrombosis and embolism: Secondary | ICD-10-CM | POA: Diagnosis present

## 2023-12-06 DIAGNOSIS — I429 Cardiomyopathy, unspecified: Secondary | ICD-10-CM | POA: Diagnosis present

## 2023-12-06 DIAGNOSIS — N1832 Chronic kidney disease, stage 3b: Secondary | ICD-10-CM | POA: Insufficient documentation

## 2023-12-06 DIAGNOSIS — I3139 Other pericardial effusion (noninflammatory): Secondary | ICD-10-CM | POA: Diagnosis present

## 2023-12-06 DIAGNOSIS — Z6830 Body mass index (BMI) 30.0-30.9, adult: Secondary | ICD-10-CM | POA: Insufficient documentation

## 2023-12-06 DIAGNOSIS — C3490 Malignant neoplasm of unspecified part of unspecified bronchus or lung: Secondary | ICD-10-CM | POA: Insufficient documentation

## 2023-12-06 DIAGNOSIS — C7951 Secondary malignant neoplasm of bone: Secondary | ICD-10-CM | POA: Diagnosis present

## 2023-12-06 DIAGNOSIS — I10 Essential (primary) hypertension: Secondary | ICD-10-CM | POA: Diagnosis present

## 2023-12-06 DIAGNOSIS — E785 Hyperlipidemia, unspecified: Secondary | ICD-10-CM | POA: Insufficient documentation

## 2023-12-06 DIAGNOSIS — I700 Atherosclerosis of aorta: Secondary | ICD-10-CM | POA: Diagnosis present

## 2023-12-06 NOTE — Progress Notes (Signed)
 Cardiology Office Note:   Date:  12/06/2023  ID:  Virginia [name], DOB 21-Nov-1955, MRN [id number] PCP:  [name], MD  [facility] Providers Cardiologist:  [name], MD Referring MD: [name], MD  Chief Complaint/Reason for Referral: Cardiology follow-up for cardiomyopathy ASSESSMENT:    1. Pericardial effusion   2. Cardiomyopathy, unspecified type (HCC)   3. Essential (primary) hypertension   4. Hyperlipidemia LDL goal <70   5. Aortic atherosclerosis (HCC)   6. Stage 3b chronic kidney disease (HCC)   7. 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 BMI 30.0-30.9,adult   9. History of DVT (deep vein thrombosis)      PLAN:   In order of problems listed above: Pericardial effusion: The patient does not have tamponade by examination but has some early signs of RV compression additionally she has new dyspnea.  Pembrolizumab has been associated with pericardial effusions.  Given the fact she is more short of breath I think it makes sense to go ahead and pursue pericardiocentesis.  I will schedule this for next Tuesday.  The patient will likely remain in-house with pericardial drain in place.  We will send the fluid for analysis.  I will let the patient's oncologist Dr. [name] know. Cardiomyopathy: EF improved on most recent echocardiogram.  Continue Toprol-XL 25 mg, spironolactone 25 mg.  Hold Lasix until after pericardiocentesis. Hypertension: Continue Toprol 25 mg and spironolactone 25 mg. Hyperlipidemia: Will defer lipid-lowering therapy in this patient with stage IV lung cancer. Aortic atherosclerosis: Will defer aggressive lipid-lowering in this patient.  Continue Eliquis 5 mg twice daily in lieu of aspirin. Non-small cell lung: Widely metastatic to bone and brain.  Starting bevacizumab  per oncology. Elevated BMI: Given cancer history, forego aggressive treatment for elevated BMI. History of DVT: On indefinite anticoagulation; continue Eliquis 5 mg  twice daily.             Dispo:  Return in about 3 months (around 03/07/2024).      Medication Adjustments/Labs and Tests Ordered: Current medicines are reviewed at length with the patient today.  Concerns regarding medicines are outlined above.  The following changes have been made:  no change   Labs/tests ordered: No orders of the defined types were placed in this encounter.   Medication Changes: No orders of the defined types were placed in this encounter.   Current medicines are reviewed at length with the patient today.  The patient does not have concerns regarding medicines.  I spent 33 minutes reviewing all clinical data during and prior to this visit including all relevant imaging studies, laboratories, clinical information from other health systems and prior notes from both Cardiology and other specialties, interviewing the patient, conducting a complete physical examination, and coordinating care in order to formulate a comprehensive and personalized evaluation and treatment plan.   History of Present Illness:      FOCUSED PROBLEM LIST:   Lung cancer Stage IV Prior treatments with carboplatin, pembrolizumab, Bevacizumab, pemtrexed Now on pembrolizumab VF arrest September 2024 Prior DVT LE u/s + DVT 9/24 On Eliquis Prior history of ICH Cardiomyopathy EF 45 to 50%, G1 DD, no significant valve problems TTE September 2024 Hypertension Hyperlipidemia Aortic atherosclerosis Chest CT 2024 CKD stage IIIb BMI 30  2/25: Patient returns for routine cardiology follow-up.  I first met the patient after she suffered a VF arrest in September 2024.  An echocardiogram at that time demonstrated a mild cardiomyopathy.  She was discharged on  a regimen including apixaban 5 mg twice daily and spironolactone 25 mg.  She has been doing fairly well from a cardiovascular standpoint.  She has noticed a little bit more peripheral edema.  She denies any paroxysmal nocturnal dyspnea or  orthopnea.  She has had no chest pain.  She is breathing fairly well and has had no acute issues.  He fortunately has not required any hospitalizations or emergency room visits.  She denies any presyncope or syncope.  She did see palliative care recently and the visit was centered on pain control.  She is still scheduled to obtain immunotherapy per hematology and oncology.  Plan: Start Lasix 20 mg daily, obtain echocardiogram.  March 2025:  Patient consents to use of AI scribe. In the interim the patient had an echocardiogram which demonstrated ejection fraction 50 to 55% with grade 1 diastolic dysfunction and a large circumferential pericardial effusion with no evidence of tamponade but mild compression of the RV.  She is here to discuss those findings.  She feels 'real weak' and 'dizzy', with increased shortness of breath over the past three to four weeks. Dizziness has been a persistent issue since her brain surgery. Her shortness of breath is noticeable but not severe.  She mentions swelling in her abdomen, legs, feet, neck, and face, which she attributes to edema. There was an attempt to reduce her steroid medication, but this led to a significant decrease in strength, so the dosage was adjusted. She is currently taking the medication every 29.5 hours, which has reduced her appetite to levels from three to four months ago.          Current Medications: Current Meds  Medication Sig   acetaminophen (TYLENOL) 325 MG tablet Take 1-2 tablets (325-650 mg total) by mouth every 4 (four) hours as needed for mild pain (pain score 1-3).   albuterol (VENTOLIN HFA) 108 (90 Base) MCG/ACT inhaler INHALE 2 PUFFS INTO THE LUNGS EVERY 6 HOURS AS NEEDED FOR WHEEZING OR SHORTNESS OF BREATH   ALPRAZolam (XANAX) 0.25 MG tablet Take 1 tablet (0.25 mg total) by mouth 2 (two) times daily as needed for anxiety.   apixaban (ELIQUIS) 5 MG TABS tablet Take 1 tablet (5 mg total) by mouth 2 (two) times daily.    Cyanocobalamin (VITAMIN B12 PO) Take 1 tablet by mouth daily. Gummies   dexamethasone (DECADRON) 1 MG tablet Take 1 tablet (1 mg total) by mouth daily.   feeding supplement (ENSURE ENLIVE / ENSURE PLUS) LIQD Take 237 mLs by mouth 2 (two) times daily between meals.   hydrocortisone (ANUSOL-HC) 2.5 % rectal cream use in rectal area 2 to 3 times a day as needed   levETIRAcetam (KEPPRA) 1000 MG tablet Take 1 tablet (1,000 mg total) by mouth 2 (two) times daily.   magnesium oxide (MAG-OX) 400 (240 Mg) MG tablet Take 1 tablet (400 mg total) by mouth 2 (two) times daily.   meclizine (ANTIVERT) 50 MG tablet Take 1 tablet (50 mg total) by mouth 3 (three) times daily.   metoprolol succinate (TOPROL-XL) 25 MG 24 hr tablet Take 1 tablet (25 mg total) by mouth daily.   Multiple Vitamin (MULTIVITAMIN WITH MINERALS) TABS tablet Take 1 tablet by mouth daily.   Nutritional Supplements (,FEEDING SUPPLEMENT, PROSOURCE PLUS) liquid Take 30 mLs by mouth 2 (two) times daily between meals.   OLANZapine (ZYPREXA) 2.5 MG tablet Take 1 tablet (2.5 mg total) by mouth at bedtime.   ondansetron (ZOFRAN) 4 MG tablet TAKE 1 TABLET(4 MG) BY  MOUTH EVERY 8 HOURS AS NEEDED FOR NAUSEA OR VOMITING   OVER THE COUNTER MEDICATION Take 2 tablets by mouth daily. Beet root   oxyCODONE (OXY IR/ROXICODONE) 5 MG immediate release tablet Take 1 tablet (5 mg total) by mouth every 4 (four) hours as needed for severe pain (pain score 7-10).   pantoprazole (PROTONIX) 40 MG tablet TAKE 1 TABLET(40 MG) BY MOUTH DAILY   polyethylene glycol (MIRALAX / GLYCOLAX) 17 g packet Take 17 g by mouth every evening.   potassium chloride SA (KLOR-CON M) 20 MEQ tablet Take 1 tablet (20 mEq total) by mouth 2 (two) times daily.   senna-docusate (SENOKOT-S) 8.6-50 MG tablet Take 2 tablets by mouth daily as needed (Patient preference, Sennakot S for mild constipation). (Patient taking differently: Take 2 tablets by mouth daily as needed for mild constipation.)    spironolactone (ALDACTONE) 25 MG tablet Take 1 tablet (25 mg total) by mouth daily.   VITAMIN D PO Take 1 capsule by mouth every evening.   XTAMPZA ER 13.5 MG C12A Take 1 capsule by mouth in the morning and at bedtime.   [DISCONTINUED] furosemide (LASIX) 20 MG tablet Take 1 tablet (20 mg total) by mouth daily.     Review of Systems:   Please see the history of present illness.    All other systems reviewed and are negative.     EKGs/Labs/Other Test Reviewed:   EKG: EKG from December 2024 demonstrates sinus rhythm, inferior infarction pattern, and IVCD  EKG Interpretation Date/Time:    Ventricular Rate:    PR Interval:    QRS Duration:    QT Interval:    QTC Calculation:   R Axis:      Text Interpretation:           Risk Assessment/Calculations:          Physical Exam:   VS:  BP 120/80   Pulse 80   Ht 5\' 2"  (1.575 m)   Wt 171 lb (77.6 kg)   SpO2 98%   BMI 31.28 kg/m        Wt Readings from Last 3 Encounters:  12/06/23 171 lb (77.6 kg)  11/27/23 171 lb 11.2 oz (77.9 kg)  10/24/23 168 lb 6.4 oz (76.4 kg)      GENERAL: Weak, AOx3 HEENT:  No carotid bruits, +2 carotid impulses, no scleral icterus CAR: RRR no murmurs, gallops, rubs, or thrills RES:  Clear to auscultation bilaterally ABD:  Soft, nontender, nondistended, positive bowel sounds x 4 VASC:  +2 radial pulses, +2 carotid pulses NEURO:  CN 2-12 grossly intact; motor and sensory grossly intact PSYCH:  No active depression or anxiety EXT:  +1 edema, ecchymosis, or cyanosis  Signed, Orbie Pyo, MD  12/06/2023 4:47 PM    [facility] [address] [place], [place], [place]  [address] Phone: [phone]; Fax: [phone]   Note:  This document was prepared using Dragon voice recognition software and may include unintentional dictation errors.

## 2023-12-06 NOTE — H&P (View-Only) (Signed)
 Cardiology Office Note:   Date:  12/06/2023  ID:  Virginia [name], DOB 21-Nov-1955, MRN [id number] PCP:  Virginia Mackintosh, MD  [facility] Providers Cardiologist:  [name], MD Referring MD: Virginia Mackintosh, MD  Chief Complaint/Reason for Referral: Cardiology follow-up for cardiomyopathy ASSESSMENT:    1. Pericardial effusion   2. Cardiomyopathy, unspecified type (HCC)   3. Essential (primary) hypertension   4. Hyperlipidemia LDL goal <70   5. Aortic atherosclerosis (HCC)   6. Stage 3b chronic kidney disease (HCC)   7. 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 BMI 30.0-30.9,adult   9. History of DVT (deep vein thrombosis)      PLAN:   In order of problems listed above: Pericardial effusion: The patient does not have tamponade by examination but has some early signs of RV compression additionally she has new dyspnea.  Pembrolizumab has been associated with pericardial effusions.  Given the fact she is more short of breath I think it makes sense to go ahead and pursue pericardiocentesis.  I will schedule this for next Tuesday.  The patient will likely remain in-house with pericardial drain in place.  We will send the fluid for analysis.  I will let the patient's oncologist Dr. [name] know. Cardiomyopathy: EF improved on most recent echocardiogram.  Continue Toprol-XL 25 mg, spironolactone 25 mg.  Hold Lasix until after pericardiocentesis. Hypertension: Continue Toprol 25 mg and spironolactone 25 mg. Hyperlipidemia: Will defer lipid-lowering therapy in this patient with stage IV lung cancer. Aortic atherosclerosis: Will defer aggressive lipid-lowering in this patient.  Continue Eliquis 5 mg twice daily in lieu of aspirin. Non-small cell lung: Widely metastatic to bone and brain.  Starting bevacizumab  per oncology. Elevated BMI: Given cancer history, forego aggressive treatment for elevated BMI. History of DVT: On indefinite anticoagulation; continue Eliquis 5 mg  twice daily.             Dispo:  Return in about 3 months (around 03/07/2024).      Medication Adjustments/Labs and Tests Ordered: Current medicines are reviewed at length with the patient today.  Concerns regarding medicines are outlined above.  The following changes have been made:  no change   Labs/tests ordered: No orders of the defined types were placed in this encounter.   Medication Changes: No orders of the defined types were placed in this encounter.   Current medicines are reviewed at length with the patient today.  The patient does not have concerns regarding medicines.  I spent 33 minutes reviewing all clinical data during and prior to this visit including all relevant imaging studies, laboratories, clinical information from other health systems and prior notes from both Cardiology and other specialties, interviewing the patient, conducting a complete physical examination, and coordinating care in order to formulate a comprehensive and personalized evaluation and treatment plan.   History of Present Illness:      FOCUSED PROBLEM LIST:   Lung cancer Stage IV Prior treatments with carboplatin, pembrolizumab, Bevacizumab, pemtrexed Now on pembrolizumab VF arrest September 2024 Prior DVT LE u/s + DVT 9/24 On Eliquis Prior history of ICH Cardiomyopathy EF 45 to 50%, G1 DD, no significant valve problems TTE September 2024 Hypertension Hyperlipidemia Aortic atherosclerosis Chest CT 2024 CKD stage IIIb BMI 30  2/25: Patient returns for routine cardiology follow-up.  I first met the patient after she suffered a VF arrest in September 2024.  An echocardiogram at that time demonstrated a mild cardiomyopathy.  She was discharged on  a regimen including apixaban 5 mg twice daily and spironolactone 25 mg.  She has been doing fairly well from a cardiovascular standpoint.  She has noticed a little bit more peripheral edema.  She denies any paroxysmal nocturnal dyspnea or  orthopnea.  She has had no chest pain.  She is breathing fairly well and has had no acute issues.  He fortunately has not required any hospitalizations or emergency room visits.  She denies any presyncope or syncope.  She did see palliative care recently and the visit was centered on pain control.  She is still scheduled to obtain immunotherapy per hematology and oncology.  Plan: Start Lasix 20 mg daily, obtain echocardiogram.  March 2025:  Patient consents to use of AI scribe. In the interim the patient had an echocardiogram which demonstrated ejection fraction 50 to 55% with grade 1 diastolic dysfunction and a large circumferential pericardial effusion with no evidence of tamponade but mild compression of the RV.  She is here to discuss those findings.  She feels 'real weak' and 'dizzy', with increased shortness of breath over the past three to four weeks. Dizziness has been a persistent issue since her brain surgery. Her shortness of breath is noticeable but not severe.  She mentions swelling in her abdomen, legs, feet, neck, and face, which she attributes to edema. There was an attempt to reduce her steroid medication, but this led to a significant decrease in strength, so the dosage was adjusted. She is currently taking the medication every 29.5 hours, which has reduced her appetite to levels from three to four months ago.          Current Medications: Current Meds  Medication Sig   acetaminophen (TYLENOL) 325 MG tablet Take 1-2 tablets (325-650 mg total) by mouth every 4 (four) hours as needed for mild pain (pain score 1-3).   albuterol (VENTOLIN HFA) 108 (90 Base) MCG/ACT inhaler INHALE 2 PUFFS INTO THE LUNGS EVERY 6 HOURS AS NEEDED FOR WHEEZING OR SHORTNESS OF BREATH   ALPRAZolam (XANAX) 0.25 MG tablet Take 1 tablet (0.25 mg total) by mouth 2 (two) times daily as needed for anxiety.   apixaban (ELIQUIS) 5 MG TABS tablet Take 1 tablet (5 mg total) by mouth 2 (two) times daily.    Cyanocobalamin (VITAMIN B12 PO) Take 1 tablet by mouth daily. Gummies   dexamethasone (DECADRON) 1 MG tablet Take 1 tablet (1 mg total) by mouth daily.   feeding supplement (ENSURE ENLIVE / ENSURE PLUS) LIQD Take 237 mLs by mouth 2 (two) times daily between meals.   hydrocortisone (ANUSOL-HC) 2.5 % rectal cream use in rectal area 2 to 3 times a day as needed   levETIRAcetam (KEPPRA) 1000 MG tablet Take 1 tablet (1,000 mg total) by mouth 2 (two) times daily.   magnesium oxide (MAG-OX) 400 (240 Mg) MG tablet Take 1 tablet (400 mg total) by mouth 2 (two) times daily.   meclizine (ANTIVERT) 50 MG tablet Take 1 tablet (50 mg total) by mouth 3 (three) times daily.   metoprolol succinate (TOPROL-XL) 25 MG 24 hr tablet Take 1 tablet (25 mg total) by mouth daily.   Multiple Vitamin (MULTIVITAMIN WITH MINERALS) TABS tablet Take 1 tablet by mouth daily.   Nutritional Supplements (,FEEDING SUPPLEMENT, PROSOURCE PLUS) liquid Take 30 mLs by mouth 2 (two) times daily between meals.   OLANZapine (ZYPREXA) 2.5 MG tablet Take 1 tablet (2.5 mg total) by mouth at bedtime.   ondansetron (ZOFRAN) 4 MG tablet TAKE 1 TABLET(4 MG) BY  MOUTH EVERY 8 HOURS AS NEEDED FOR NAUSEA OR VOMITING   OVER THE COUNTER MEDICATION Take 2 tablets by mouth daily. Beet root   oxyCODONE (OXY IR/ROXICODONE) 5 MG immediate release tablet Take 1 tablet (5 mg total) by mouth every 4 (four) hours as needed for severe pain (pain score 7-10).   pantoprazole (PROTONIX) 40 MG tablet TAKE 1 TABLET(40 MG) BY MOUTH DAILY   polyethylene glycol (MIRALAX / GLYCOLAX) 17 g packet Take 17 g by mouth every evening.   potassium chloride SA (KLOR-CON M) 20 MEQ tablet Take 1 tablet (20 mEq total) by mouth 2 (two) times daily.   senna-docusate (SENOKOT-S) 8.6-50 MG tablet Take 2 tablets by mouth daily as needed (Patient preference, Sennakot S for mild constipation). (Patient taking differently: Take 2 tablets by mouth daily as needed for mild constipation.)    spironolactone (ALDACTONE) 25 MG tablet Take 1 tablet (25 mg total) by mouth daily.   VITAMIN D PO Take 1 capsule by mouth every evening.   XTAMPZA ER 13.5 MG C12A Take 1 capsule by mouth in the morning and at bedtime.   [DISCONTINUED] furosemide (LASIX) 20 MG tablet Take 1 tablet (20 mg total) by mouth daily.     Review of Systems:   Please see the history of present illness.    All other systems reviewed and are negative.     EKGs/Labs/Other Test Reviewed:   EKG: EKG from December 2024 demonstrates sinus rhythm, inferior infarction pattern, and IVCD  EKG Interpretation Date/Time:    Ventricular Rate:    PR Interval:    QRS Duration:    QT Interval:    QTC Calculation:   R Axis:      Text Interpretation:           Risk Assessment/Calculations:          Physical Exam:   VS:  BP 120/80   Pulse 80   Ht 5\' 2"  (1.575 m)   Wt 171 lb (77.6 kg)   SpO2 98%   BMI 31.28 kg/m        Wt Readings from Last 3 Encounters:  12/06/23 171 lb (77.6 kg)  11/27/23 171 lb 11.2 oz (77.9 kg)  10/24/23 168 lb 6.4 oz (76.4 kg)      GENERAL: Weak, AOx3 HEENT:  No carotid bruits, +2 carotid impulses, no scleral icterus CAR: RRR no murmurs, gallops, rubs, or thrills RES:  Clear to auscultation bilaterally ABD:  Soft, nontender, nondistended, positive bowel sounds x 4 VASC:  +2 radial pulses, +2 carotid pulses NEURO:  CN 2-12 grossly intact; motor and sensory grossly intact PSYCH:  No active depression or anxiety EXT:  +1 edema, ecchymosis, or cyanosis  Signed, Orbie Pyo, MD  12/06/2023 4:47 PM    [facility] [address] [place], [place], [place]  [address] Phone: [phone]; Fax: [phone]   Note:  This document was prepared using Dragon voice recognition software and may include unintentional dictation errors.

## 2023-12-06 NOTE — Progress Notes (Signed)
 Cardiology Office Note:   Date:  12/06/2023  ID:  Virginia [name], DOB [birth year], MRN [id number] PCP:  [name], MD  [facility] Providers Cardiologist:  [name], MD Referring MD: [name], MD  Chief Complaint/Reason for Referral: Cardiology follow-up for cardiomyopathy ASSESSMENT:    1. Pericardial effusion   2. Cardiomyopathy, unspecified type (HCC)   3. Essential (primary) hypertension   4. Hyperlipidemia LDL goal <70   5. Aortic atherosclerosis (HCC)   6. Stage 3b chronic kidney disease (HCC)   7. 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 BMI 30.0-30.9,adult   9. History of DVT (deep vein thrombosis)      PLAN:   In order of problems listed above: Pericardial effusion: The patient does not have tamponade by examination but has some early signs of RV compression additionally she has new dyspnea.  Pembrolizumab has been associated with pericardial effusions.  Given the fact she is more short of breath I think it makes sense to go ahead and pursue pericardiocentesis.  I will schedule this for next Tuesday.  The patient will likely remain in-house with pericardial drain in place.  We will send the fluid for analysis.  I will let the patient's oncologist Dr. [name] know. Cardiomyopathy: EF improved on most recent echocardiogram.  Continue Toprol-XL 25 mg, spironolactone 25 mg.  Hold Lasix until after pericardiocentesis. Hypertension: Continue Toprol 25 mg and spironolactone 25 mg. Hyperlipidemia: Will defer lipid-lowering therapy in this patient with stage IV lung cancer. Aortic atherosclerosis: Will defer aggressive lipid-lowering in this patient.  Continue Eliquis 5 mg twice daily in lieu of aspirin. Non-small cell lung: Widely metastatic to bone and brain.  Starting bevacizumab  per oncology. Elevated BMI: Given cancer history, forego aggressive treatment for elevated BMI. History of DVT: On indefinite anticoagulation; continue Eliquis 5 mg  twice daily.             Dispo:  Return in about 3 months (around 03/07/2024).      Medication Adjustments/Labs and Tests Ordered: Current medicines are reviewed at length with the patient today.  Concerns regarding medicines are outlined above.  The following changes have been made:  no change   Labs/tests ordered: No orders of the defined types were placed in this encounter.   Medication Changes: No orders of the defined types were placed in this encounter.   Current medicines are reviewed at length with the patient today.  The patient does not have concerns regarding medicines.  I spent 33 minutes reviewing all clinical data during and prior to this visit including all relevant imaging studies, laboratories, clinical information from other health systems and prior notes from both Cardiology and other specialties, interviewing the patient, conducting a complete physical examination, and coordinating care in order to formulate a comprehensive and personalized evaluation and treatment plan.   History of Present Illness:      FOCUSED PROBLEM LIST:   Lung cancer Stage IV Prior treatments with carboplatin, pembrolizumab, Bevacizumab, pemtrexed Now on pembrolizumab VF arrest September 2024 Prior DVT LE u/s + DVT 9/24 On Eliquis Prior history of ICH Cardiomyopathy EF 45 to 50%, G1 DD, no significant valve problems TTE September 2024 Hypertension Hyperlipidemia Aortic atherosclerosis Chest CT 2024 CKD stage IIIb BMI 30  2/25: Patient returns for routine cardiology follow-up.  I first met the patient after she suffered a VF arrest in September 2024.  An echocardiogram at that time demonstrated a mild cardiomyopathy.  She was discharged on  a regimen including apixaban 5 mg twice daily and spironolactone 25 mg.  She has been doing fairly well from a cardiovascular standpoint.  She has noticed a little bit more peripheral edema.  She denies any paroxysmal nocturnal dyspnea or  orthopnea.  She has had no chest pain.  She is breathing fairly well and has had no acute issues.  He fortunately has not required any hospitalizations or emergency room visits.  She denies any presyncope or syncope.  She did see palliative care recently and the visit was centered on pain control.  She is still scheduled to obtain immunotherapy per hematology and oncology.  Plan: Start Lasix 20 mg daily, obtain echocardiogram.  March 2025:  Patient consents to use of AI scribe. In the interim the patient had an echocardiogram which demonstrated ejection fraction 50 to 55% with grade 1 diastolic dysfunction and a large circumferential pericardial effusion with no evidence of tamponade but mild compression of the RV.  She is here to discuss those findings.  She feels 'real weak' and 'dizzy', with increased shortness of breath over the past three to four weeks. Dizziness has been a persistent issue since her brain surgery. Her shortness of breath is noticeable but not severe.  She mentions swelling in her abdomen, legs, feet, neck, and face, which she attributes to edema. There was an attempt to reduce her steroid medication, but this led to a significant decrease in strength, so the dosage was adjusted. She is currently taking the medication every 29.5 hours, which has reduced her appetite to levels from three to four months ago.          Current Medications: Current Meds  Medication Sig   acetaminophen (TYLENOL) 325 MG tablet Take 1-2 tablets (325-650 mg total) by mouth every 4 (four) hours as needed for mild pain (pain score 1-3).   albuterol (VENTOLIN HFA) 108 (90 Base) MCG/ACT inhaler INHALE 2 PUFFS INTO THE LUNGS EVERY 6 HOURS AS NEEDED FOR WHEEZING OR SHORTNESS OF BREATH   ALPRAZolam (XANAX) 0.25 MG tablet Take 1 tablet (0.25 mg total) by mouth 2 (two) times daily as needed for anxiety.   apixaban (ELIQUIS) 5 MG TABS tablet Take 1 tablet (5 mg total) by mouth 2 (two) times daily.    Cyanocobalamin (VITAMIN B12 PO) Take 1 tablet by mouth daily. Gummies   dexamethasone (DECADRON) 1 MG tablet Take 1 tablet (1 mg total) by mouth daily.   feeding supplement (ENSURE ENLIVE / ENSURE PLUS) LIQD Take 237 mLs by mouth 2 (two) times daily between meals.   hydrocortisone (ANUSOL-HC) 2.5 % rectal cream use in rectal area 2 to 3 times a day as needed   levETIRAcetam (KEPPRA) 1000 MG tablet Take 1 tablet (1,000 mg total) by mouth 2 (two) times daily.   magnesium oxide (MAG-OX) 400 (240 Mg) MG tablet Take 1 tablet (400 mg total) by mouth 2 (two) times daily.   meclizine (ANTIVERT) 50 MG tablet Take 1 tablet (50 mg total) by mouth 3 (three) times daily.   metoprolol succinate (TOPROL-XL) 25 MG 24 hr tablet Take 1 tablet (25 mg total) by mouth daily.   Multiple Vitamin (MULTIVITAMIN WITH MINERALS) TABS tablet Take 1 tablet by mouth daily.   Nutritional Supplements (,FEEDING SUPPLEMENT, PROSOURCE PLUS) liquid Take 30 mLs by mouth 2 (two) times daily between meals.   OLANZapine (ZYPREXA) 2.5 MG tablet Take 1 tablet (2.5 mg total) by mouth at bedtime.   ondansetron (ZOFRAN) 4 MG tablet TAKE 1 TABLET(4 MG) BY  MOUTH EVERY 8 HOURS AS NEEDED FOR NAUSEA OR VOMITING   OVER THE COUNTER MEDICATION Take 2 tablets by mouth daily. Beet root   oxyCODONE (OXY IR/ROXICODONE) 5 MG immediate release tablet Take 1 tablet (5 mg total) by mouth every 4 (four) hours as needed for severe pain (pain score 7-10).   pantoprazole (PROTONIX) 40 MG tablet TAKE 1 TABLET(40 MG) BY MOUTH DAILY   polyethylene glycol (MIRALAX / GLYCOLAX) 17 g packet Take 17 g by mouth every evening.   potassium chloride SA (KLOR-CON M) 20 MEQ tablet Take 1 tablet (20 mEq total) by mouth 2 (two) times daily.   senna-docusate (SENOKOT-S) 8.6-50 MG tablet Take 2 tablets by mouth daily as needed (Patient preference, Sennakot S for mild constipation). (Patient taking differently: Take 2 tablets by mouth daily as needed for mild constipation.)    spironolactone (ALDACTONE) 25 MG tablet Take 1 tablet (25 mg total) by mouth daily.   VITAMIN D PO Take 1 capsule by mouth every evening.   XTAMPZA ER 13.5 MG C12A Take 1 capsule by mouth in the morning and at bedtime.   [DISCONTINUED] furosemide (LASIX) 20 MG tablet Take 1 tablet (20 mg total) by mouth daily.     Review of Systems:   Please see the history of present illness.    All other systems reviewed and are negative.     EKGs/Labs/Other Test Reviewed:   EKG: EKG from December 2024 demonstrates sinus rhythm, inferior infarction pattern, and IVCD  EKG Interpretation Date/Time:    Ventricular Rate:    PR Interval:    QRS Duration:    QT Interval:    QTC Calculation:   R Axis:      Text Interpretation:           Risk Assessment/Calculations:          Physical Exam:   VS:  BP 120/80   Pulse 80   Ht 5\' 2"  (1.575 m)   Wt 171 lb (77.6 kg)   SpO2 98%   BMI 31.28 kg/m        Wt Readings from Last 3 Encounters:  12/06/23 171 lb (77.6 kg)  11/27/23 171 lb 11.2 oz (77.9 kg)  10/24/23 168 lb 6.4 oz (76.4 kg)      GENERAL: Weak, AOx3 HEENT:  No carotid bruits, +2 carotid impulses, no scleral icterus CAR: RRR no murmurs, gallops, rubs, or thrills RES:  Clear to auscultation bilaterally ABD:  Soft, nontender, nondistended, positive bowel sounds x 4 VASC:  +2 radial pulses, +2 carotid pulses NEURO:  CN 2-12 grossly intact; motor and sensory grossly intact PSYCH:  No active depression or anxiety EXT:  +1 edema, ecchymosis, or cyanosis  Signed, Orbie Pyo, MD  12/06/2023 4:45 PM    [facility] [address] [place], [place], [place]  [address] Phone: [phone]; Fax: [phone]   Note:  This document was prepared using Dragon voice recognition software and may include unintentional dictation errors.

## 2023-12-06 NOTE — Patient Instructions (Signed)
 Medication Instructions:  Stop Lasix for now.  You'll be instructed on when to restart. *If you need a refill on your cardiac medications before your next appointment, please call your pharmacy*  Lab Work: none  Testing/Procedures: Pericardiocentesis - Tue April 1 at [facility] - See instructions below  Follow-Up: 3 months with Dr. Lynnette Caffey       Pericardiocentesis    Cardiac/Peripheral Catheterization You are scheduled for a pericardiocentesis on Tuesday 12/10/23 with Dr. Lynnette Caffey.   1. Please arrive at the [facility] (Main Entrance A) at [facility]: [address] [place], [place] [address] at 11:30 AM (This time is TWO hour(s) before your procedure to ensure your preparation).   Free valet parking service is available. You will check in at ADMITTING. The support person will be asked to wait in the waiting room.  It is OK to have someone drop you off and come back when you are ready to be discharged.        Special note: Every effort is made to have your procedure done on time. Please understand that emergencies sometimes delay scheduled procedures.  2. Diet: Do not eat solid foods after midnight.  You may have clear liquids until 5 AM the day of the procedure.  3. Labs: up to date  4. Medication instructions in preparation for your procedure:   Contrast Allergy: No   No Eliquis after Sunday March 30   On the morning of your procedure, take any morning medicines NOT listed above.  You may use sips of water.   6. You MUST have a responsible adult to drive you home. 7. An adult MUST be with you the first 24 hours after you arrive home. 8. Bring a current list of your medications, and the last time and date medication taken. 9. Bring ID and current insurance cards. 10.Please wear clothes that are easy to get on and off and wear slip-on shoes.  Thank you for allowing [name] to care for you!   -- [place] [facility]       1st Floor: -  Lobby - Registration  - Pharmacy  - Lab - Cafe  2nd Floor: - PV Lab - Diagnostic Testing (echo, CT, nuclear med)  3rd Floor: - Vacant  4th Floor: - TCTS (cardiothoracic surgery) - AFib Clinic - Structural Heart Clinic - Vascular Surgery  - Vascular Ultrasound  5th Floor: - [facility]Care Cardiology (general and EP) - Clinical Pharmacy for coumadin, hypertension, lipid, weight-loss medications, and med management appointments    Valet parking services will be available as well.

## 2023-12-07 ENCOUNTER — Other Ambulatory Visit: Payer: Self-pay | Admitting: Internal Medicine

## 2023-12-07 DIAGNOSIS — C7951 Secondary malignant neoplasm of bone: Secondary | ICD-10-CM

## 2023-12-07 DIAGNOSIS — Z515 Encounter for palliative care: Secondary | ICD-10-CM

## 2023-12-07 DIAGNOSIS — G893 Neoplasm related pain (acute) (chronic): Secondary | ICD-10-CM

## 2023-12-07 MED ORDER — ALPRAZOLAM 0.25 MG PO TABS: 0.2500 mg | ORAL_TABLET | Freq: Two times a day (BID) | ORAL | 0 refills | Status: DC | PRN

## 2023-12-07 MED ORDER — OXYCODONE HCL 5 MG PO TABS: 5.0000 mg | ORAL_TABLET | ORAL | 0 refills | Status: DC | PRN

## 2023-12-09 ENCOUNTER — Ambulatory Visit: Admitting: Internal Medicine

## 2023-12-09 ENCOUNTER — Telehealth: Payer: Self-pay | Admitting: *Deleted

## 2023-12-09 ENCOUNTER — Other Ambulatory Visit: Payer: Self-pay | Admitting: *Deleted

## 2023-12-09 MED ORDER — LEVETIRACETAM 1000 MG PO TABS: 1000.0000 mg | ORAL_TABLET | Freq: Two times a day (BID) | ORAL | 2 refills | Status: DC

## 2023-12-09 NOTE — Telephone Encounter (Signed)
 Pericardiocentesis scheduled at [facility] for: Tuesday December 10, 2023 1:30 PM Arrival time [facility] Main Entrance A at: 11:30 AM  Nothing to eat after midnight prior to procedure, clear liquids until 5 AM day of procedure.  Medication instructions: -Hold:  Eliquis-pt reports last dose of Eliquis was 12/08/23 @9 :30 PM-knows to hold until post procedure  Spironolactone-AM of procedure -Other usual morning medications can be taken with sips of water.  Plan to go home the same day, you will only stay overnight if medically necessary.  You must have responsible adult to drive you home.  Someone must be with you the first 24 hours after you arrive home.  Reviewed procedure instructions with patient and her husband.

## 2023-12-10 ENCOUNTER — Other Ambulatory Visit: Payer: Self-pay

## 2023-12-10 ENCOUNTER — Encounter (HOSPITAL_COMMUNITY)
Admission: RE | Disposition: A | Discharge status: Home Health | Payer: Self-pay | Source: Home / Self Care | Attending: Internal Medicine

## 2023-12-10 ENCOUNTER — Encounter (HOSPITAL_COMMUNITY): Payer: Self-pay | Admitting: Internal Medicine

## 2023-12-10 ENCOUNTER — Inpatient Hospital Stay (HOSPITAL_COMMUNITY)
Admission: RE | Admit: 2023-12-10 | Discharge: 2023-12-12 | DRG: 181 | Disposition: A | Discharge status: Home Health | Attending: Internal Medicine | Admitting: Internal Medicine

## 2023-12-10 DIAGNOSIS — I700 Atherosclerosis of aorta: Secondary | ICD-10-CM | POA: Diagnosis present

## 2023-12-10 DIAGNOSIS — Z01812 Encounter for preprocedural laboratory examination: Principal | ICD-10-CM

## 2023-12-10 DIAGNOSIS — E785 Hyperlipidemia, unspecified: Secondary | ICD-10-CM | POA: Diagnosis present

## 2023-12-10 DIAGNOSIS — I3139 Other pericardial effusion (noninflammatory): Secondary | ICD-10-CM | POA: Diagnosis present

## 2023-12-10 DIAGNOSIS — I429 Cardiomyopathy, unspecified: Secondary | ICD-10-CM | POA: Diagnosis present

## 2023-12-10 DIAGNOSIS — C7931 Secondary malignant neoplasm of brain: Secondary | ICD-10-CM | POA: Diagnosis present

## 2023-12-10 DIAGNOSIS — N1832 Chronic kidney disease, stage 3b: Secondary | ICD-10-CM | POA: Diagnosis present

## 2023-12-10 DIAGNOSIS — C7951 Secondary malignant neoplasm of bone: Secondary | ICD-10-CM | POA: Diagnosis present

## 2023-12-10 DIAGNOSIS — C3490 Malignant neoplasm of unspecified part of unspecified bronchus or lung: Secondary | ICD-10-CM | POA: Diagnosis not present

## 2023-12-10 DIAGNOSIS — Z7952 Long term (current) use of systemic steroids: Secondary | ICD-10-CM | POA: Diagnosis not present

## 2023-12-10 DIAGNOSIS — I129 Hypertensive chronic kidney disease with stage 1 through stage 4 chronic kidney disease, or unspecified chronic kidney disease: Secondary | ICD-10-CM | POA: Diagnosis present

## 2023-12-10 DIAGNOSIS — C3431 Malignant neoplasm of lower lobe, right bronchus or lung: Principal | ICD-10-CM | POA: Diagnosis present

## 2023-12-10 DIAGNOSIS — Z7901 Long term (current) use of anticoagulants: Secondary | ICD-10-CM | POA: Diagnosis not present

## 2023-12-10 DIAGNOSIS — Z8674 Personal history of sudden cardiac arrest: Secondary | ICD-10-CM

## 2023-12-10 DIAGNOSIS — Z79899 Other long term (current) drug therapy: Secondary | ICD-10-CM

## 2023-12-10 DIAGNOSIS — Z86718 Personal history of other venous thrombosis and embolism: Secondary | ICD-10-CM | POA: Diagnosis not present

## 2023-12-10 DIAGNOSIS — C781 Secondary malignant neoplasm of mediastinum: Secondary | ICD-10-CM | POA: Diagnosis not present

## 2023-12-10 DIAGNOSIS — I3131 Malignant pericardial effusion in diseases classified elsewhere: Secondary | ICD-10-CM | POA: Diagnosis present

## 2023-12-10 HISTORY — PX: PERICARDIOCENTESIS: CATH118255

## 2023-12-10 LAB — CBC WITH DIFFERENTIAL/PLATELET
Abs Immature Granulocytes: 0.11 10*3/uL — ABNORMAL HIGH (ref 0.00–0.07)
Basophils Absolute: 0 10*3/uL (ref 0.0–0.1)
Basophils Relative: 0 %
Eosinophils Absolute: 0 10*3/uL (ref 0.0–0.5)
Eosinophils Relative: 0 %
HCT: 36.6 % (ref 36.0–46.0)
Hemoglobin: 12.4 g/dL (ref 12.0–15.0)
Immature Granulocytes: 1 %
Lymphocytes Relative: 27 %
Lymphs Abs: 2.3 10*3/uL (ref 0.7–4.0)
MCH: 32.6 pg (ref 26.0–34.0)
MCHC: 33.9 g/dL (ref 30.0–36.0)
MCV: 96.3 fL (ref 80.0–100.0)
Monocytes Absolute: 0.6 10*3/uL (ref 0.1–1.0)
Monocytes Relative: 7 %
Neutro Abs: 5.3 10*3/uL (ref 1.7–7.7)
Neutrophils Relative %: 65 %
Platelets: 239 10*3/uL (ref 150–400)
RBC: 3.8 MIL/uL — ABNORMAL LOW (ref 3.87–5.11)
RDW: 12.1 % (ref 11.5–15.5)
WBC: 8.3 10*3/uL (ref 4.0–10.5)
nRBC: 0 % (ref 0.0–0.2)

## 2023-12-10 LAB — BODY FLUID CELL COUNT WITH DIFFERENTIAL
Eos, Fluid: 1 %
Lymphs, Fluid: 57 %
Monocyte-Macrophage-Serous Fluid: 8 % — ABNORMAL LOW (ref 50–90)
Neutrophil Count, Fluid: 33 % — ABNORMAL HIGH (ref 0–25)
Other Cells, Fluid: 1 %
Total Nucleated Cell Count, Fluid: 111 uL (ref 0–1000)

## 2023-12-10 LAB — GRAM STAIN: Gram Stain: NONE SEEN

## 2023-12-10 LAB — MRSA NEXT GEN BY PCR, NASAL: MRSA by PCR Next Gen: NOT DETECTED

## 2023-12-10 SURGERY — PERICARDIOCENTESIS
Anesthesia: LOCAL

## 2023-12-10 MED ORDER — FENTANYL CITRATE (PF) 100 MCG/2ML IJ SOLN
INTRAMUSCULAR | Status: AC
  Filled 2023-12-10: qty 2

## 2023-12-10 MED ORDER — METOPROLOL SUCCINATE ER 25 MG PO TB24
25.0000 mg | ORAL_TABLET | Freq: Every day | ORAL | Status: DC
  Administered 2023-12-11 – 2023-12-12 (×2): 25 mg via ORAL
  Filled 2023-12-10 (×2): qty 1

## 2023-12-10 MED ORDER — IOHEXOL 350 MG/ML SOLN
INTRAVENOUS | Status: DC | PRN
  Administered 2023-12-10: 6 mL

## 2023-12-10 MED ORDER — PROMETHAZINE HCL 12.5 MG PO TABS
12.5000 mg | ORAL_TABLET | Freq: Three times a day (TID) | ORAL | Status: DC | PRN
  Administered 2023-12-11: 12.5 mg via ORAL
  Filled 2023-12-10 (×2): qty 1

## 2023-12-10 MED ORDER — MECLIZINE HCL 25 MG PO TABS
50.0000 mg | ORAL_TABLET | Freq: Three times a day (TID) | ORAL | Status: DC
  Administered 2023-12-10 – 2023-12-12 (×6): 50 mg via ORAL
  Filled 2023-12-10 (×6): qty 2

## 2023-12-10 MED ORDER — LIDOCAINE HCL (PF) 1 % IJ SOLN
INTRAMUSCULAR | Status: DC | PRN
  Administered 2023-12-10: 5 mL

## 2023-12-10 MED ORDER — SPIRONOLACTONE 25 MG PO TABS
25.0000 mg | ORAL_TABLET | Freq: Every day | ORAL | Status: DC
  Administered 2023-12-11 – 2023-12-12 (×2): 25 mg via ORAL
  Filled 2023-12-10 (×2): qty 1

## 2023-12-10 MED ORDER — HEPARIN (PORCINE) IN NACL 1000-0.9 UT/500ML-% IV SOLN
INTRAVENOUS | Status: DC | PRN
  Administered 2023-12-10: 500 mL via SURGICAL_CAVITY

## 2023-12-10 MED ORDER — MIDAZOLAM HCL 2 MG/2ML IJ SOLN
INTRAMUSCULAR | Status: AC
  Filled 2023-12-10: qty 2

## 2023-12-10 MED ORDER — SODIUM CHLORIDE 0.9 % IV SOLN: INTRAVENOUS | Status: DC

## 2023-12-10 MED ORDER — ALBUTEROL SULFATE HFA 108 (90 BASE) MCG/ACT IN AERS: 2.0000 | INHALATION_SPRAY | Freq: Four times a day (QID) | RESPIRATORY_TRACT | Status: DC | PRN

## 2023-12-10 MED ORDER — OLANZAPINE 2.5 MG PO TABS
2.5000 mg | ORAL_TABLET | Freq: Every day | ORAL | Status: DC
  Administered 2023-12-10 – 2023-12-11 (×2): 2.5 mg via ORAL
  Filled 2023-12-10 (×2): qty 1

## 2023-12-10 MED ORDER — OXYCODONE HCL 5 MG PO TABS
5.0000 mg | ORAL_TABLET | ORAL | Status: DC | PRN
  Administered 2023-12-10 – 2023-12-12 (×9): 5 mg via ORAL
  Filled 2023-12-10 (×9): qty 1

## 2023-12-10 MED ORDER — PROSOURCE PLUS PO LIQD
30.0000 mL | Freq: Four times a day (QID) | ORAL | Status: DC
  Administered 2023-12-10 – 2023-12-12 (×5): 30 mL via ORAL
  Filled 2023-12-10 (×5): qty 30

## 2023-12-10 MED ORDER — FENTANYL CITRATE (PF) 100 MCG/2ML IJ SOLN
INTRAMUSCULAR | Status: DC | PRN
  Administered 2023-12-10: 25 ug via INTRAVENOUS

## 2023-12-10 MED ORDER — MAGNESIUM OXIDE -MG SUPPLEMENT 400 (240 MG) MG PO TABS
400.0000 mg | ORAL_TABLET | Freq: Two times a day (BID) | ORAL | Status: DC
  Administered 2023-12-10 – 2023-12-12 (×4): 400 mg via ORAL
  Filled 2023-12-10 (×4): qty 1

## 2023-12-10 MED ORDER — DEXAMETHASONE 0.5 MG PO TABS
1.0000 mg | ORAL_TABLET | Freq: Every day | ORAL | Status: DC
  Administered 2023-12-11 – 2023-12-12 (×2): 1 mg via ORAL
  Filled 2023-12-10 (×2): qty 2

## 2023-12-10 MED ORDER — ONDANSETRON HCL 4 MG/2ML IJ SOLN
4.0000 mg | Freq: Four times a day (QID) | INTRAMUSCULAR | Status: DC | PRN
  Administered 2023-12-10: 4 mg via INTRAVENOUS
  Filled 2023-12-10: qty 2

## 2023-12-10 MED ORDER — MIDAZOLAM HCL 2 MG/2ML IJ SOLN
INTRAMUSCULAR | Status: DC | PRN
  Administered 2023-12-10: 1 mg via INTRAVENOUS

## 2023-12-10 MED ORDER — ORAL CARE MOUTH RINSE: 15.0000 mL | OROMUCOSAL | Status: DC | PRN

## 2023-12-10 MED ORDER — ALBUTEROL SULFATE (2.5 MG/3ML) 0.083% IN NEBU: 2.5000 mg | INHALATION_SOLUTION | Freq: Four times a day (QID) | RESPIRATORY_TRACT | Status: DC | PRN

## 2023-12-10 MED ORDER — LEVETIRACETAM 250 MG PO TABS
1000.0000 mg | ORAL_TABLET | Freq: Two times a day (BID) | ORAL | Status: DC
  Administered 2023-12-10: 500 mg via ORAL
  Administered 2023-12-11 – 2023-12-12 (×3): 1000 mg via ORAL
  Filled 2023-12-10 (×4): qty 4

## 2023-12-10 MED ORDER — ACETAMINOPHEN 325 MG PO TABS
650.0000 mg | ORAL_TABLET | ORAL | Status: DC | PRN
  Administered 2023-12-10: 650 mg via ORAL
  Filled 2023-12-10: qty 2

## 2023-12-10 MED ORDER — LIDOCAINE HCL (PF) 1 % IJ SOLN
INTRAMUSCULAR | Status: AC
  Filled 2023-12-10: qty 30

## 2023-12-10 MED ORDER — OXYCODONE HCL ER 15 MG PO T12A
15.0000 mg | EXTENDED_RELEASE_TABLET | Freq: Two times a day (BID) | ORAL | Status: DC
  Administered 2023-12-11 – 2023-12-12 (×3): 15 mg via ORAL
  Filled 2023-12-10 (×5): qty 1

## 2023-12-10 MED ORDER — PANTOPRAZOLE SODIUM 40 MG PO TBEC
40.0000 mg | DELAYED_RELEASE_TABLET | Freq: Every day | ORAL | Status: DC
  Administered 2023-12-11 – 2023-12-12 (×2): 40 mg via ORAL
  Filled 2023-12-10 (×2): qty 1

## 2023-12-10 SURGICAL SUPPLY — 5 items
SET ATX-X65L (MISCELLANEOUS) IMPLANT
TRAY PERICARDIOCENTESIS 6FX60 (TRAY / TRAY PROCEDURE) IMPLANT
TUBING CIL FLEX 10 FLL-RA (TUBING) IMPLANT
WIRE EMERALD 3MM-J .035X150CM (WIRE) IMPLANT
WIRE MICRO SET SILHO 5FR 7 (SHEATH) IMPLANT

## 2023-12-10 NOTE — Interval H&P Note (Signed)
 History and Physical Interval Note:  12/10/2023 3:05 PM  Virginia [name]  has presented today for surgery, with the diagnosis of pericardial effusion.  The various methods of treatment have been discussed with the patient and family. After consideration of risks, benefits and other options for treatment, the patient has consented to  Procedure(s): PERICARDIOCENTESIS (N/A) as a surgical intervention.  The patient's history has been reviewed, patient examined, no change in status, stable for surgery.  I have reviewed the patient's chart and labs.  Questions were answered to the patient's satisfaction.     Orbie Pyo

## 2023-12-11 ENCOUNTER — Encounter (HOSPITAL_COMMUNITY): Payer: Self-pay | Admitting: Internal Medicine

## 2023-12-11 DIAGNOSIS — I3139 Other pericardial effusion (noninflammatory): Secondary | ICD-10-CM | POA: Diagnosis not present

## 2023-12-11 LAB — GLUCOSE, BODY FLUID OTHER: Glucose, Body Fluid Other: 127 mg/dL

## 2023-12-11 LAB — CBC
HCT: 42.5 % (ref 36.0–46.0)
Hemoglobin: 14.5 g/dL (ref 12.0–15.0)
MCH: 32.7 pg (ref 26.0–34.0)
MCHC: 34.1 g/dL (ref 30.0–36.0)
MCV: 95.7 fL (ref 80.0–100.0)
Platelets: 283 10*3/uL (ref 150–400)
RBC: 4.44 MIL/uL (ref 3.87–5.11)
RDW: 12.3 % (ref 11.5–15.5)
WBC: 16.7 10*3/uL — ABNORMAL HIGH (ref 4.0–10.5)
nRBC: 0 % (ref 0.0–0.2)

## 2023-12-11 LAB — BASIC METABOLIC PANEL WITH GFR
Anion gap: 12 (ref 5–15)
BUN: 31 mg/dL — ABNORMAL HIGH (ref 8–23)
CO2: 26 mmol/L (ref 22–32)
Calcium: 8.9 mg/dL (ref 8.9–10.3)
Chloride: 99 mmol/L (ref 98–111)
Creatinine, Ser: 1.53 mg/dL — ABNORMAL HIGH (ref 0.44–1.00)
GFR, Estimated: 37 mL/min — ABNORMAL LOW (ref >60)
Glucose, Bld: 179 mg/dL — ABNORMAL HIGH (ref 70–99)
Potassium: 4.1 mmol/L (ref 3.5–5.1)
Sodium: 137 mmol/L (ref 135–145)

## 2023-12-11 LAB — PROTEIN, BODY FLUID (OTHER): Total Protein, Body Fluid Other: 3.3 g/dL

## 2023-12-11 LAB — LD, BODY FLUID (OTHER): LD, Body Fluid: 443 IU/L

## 2023-12-11 MED ORDER — COLCHICINE 0.6 MG PO TABS
0.6000 mg | ORAL_TABLET | Freq: Every day | ORAL | Status: DC
  Administered 2023-12-11 – 2023-12-12 (×2): 0.6 mg via ORAL
  Filled 2023-12-11 (×2): qty 1

## 2023-12-11 MED ORDER — SENNOSIDES-DOCUSATE SODIUM 8.6-50 MG PO TABS
2.0000 | ORAL_TABLET | Freq: Two times a day (BID) | ORAL | Status: DC
  Administered 2023-12-11 – 2023-12-12 (×3): 2 via ORAL
  Filled 2023-12-11 (×3): qty 2

## 2023-12-11 MED ORDER — PNEUMOCOCCAL 20-VAL CONJ VACC 0.5 ML IM SUSY
0.5000 mL | PREFILLED_SYRINGE | INTRAMUSCULAR | Status: DC
  Filled 2023-12-11: qty 0.5

## 2023-12-11 NOTE — Plan of Care (Signed)

## 2023-12-11 NOTE — Progress Notes (Signed)
 Patient Name: Virginia [name] Date of Encounter: 12/11/2023 [facility] Cardiologist: T[facility] Providers Cardiologist:  [name], MD Click to update primary MD,subspecialty MD or APP then REFRESH:1}   12/10/2023 .admit Length of stay: 1  Interval Summary  .    Virginia [name] is a 68 y.o. female patient with non-small cell lung cancer with metastasis, presently on bevacizumab, developed moderate pericardial effusion that needed pericardiocentesis on 12/10/2023, no pericardial drainage in situ.  Patient presently complains of back pain since procedure but otherwise states that she is doing well.  Physical Exam    Vitals:   12/11/23 0500 12/11/23 0600 12/11/23 0727 12/11/23 0800  BP: 104/82 97/81 105/83   Pulse: (!) 106 (!) 111 (!) 113   Resp:    18  Temp:   98.3 F (36.8 C)   TempSrc:   Oral   SpO2: 95% 94% 94%   Weight:      Height:       Physical Exam Constitutional:      Appearance: She is obese.  Neck:     Vascular: No carotid bruit or JVD.  Cardiovascular:     Rate and Rhythm: Normal rate and regular rhythm.     Pulses: Intact distal pulses.     Heart sounds: Normal heart sounds. No murmur heard.    No gallop.  Pulmonary:     Effort: Pulmonary effort is normal.     Breath sounds: Examination of the left-middle field reveals rales. Examination of the right-lower field reveals rales. Examination of the left-lower field reveals rales. Rales present.  Abdominal:     General: Bowel sounds are normal.     Palpations: Abdomen is soft.  Musculoskeletal:     Right lower leg: No edema.     Left lower leg: No edema.        12/10/2023   12:13 PM 12/06/2023    3:21 PM 11/27/2023    2:21 PM  Last 3 Weights  Weight (lbs) 171 lb 171 lb 171 lb 11.2 oz  Weight (kg) 77.565 kg 77.565 kg 77.883 kg      Labs   Lab Results  Component Value Date   NA 140 11/27/2023   K 3.8 11/27/2023   CO2 30 11/27/2023   GLUCOSE 92 11/27/2023   BUN 47 (H)  11/27/2023   CREATININE 0.95 11/27/2023   CALCIUM 9.5 11/27/2023   GFRNONAA >60 11/27/2023       Latest Ref Rng & Units 11/27/2023    1:46 PM 10/15/2023   12:54 PM 09/23/2023   10:46 AM  BMP  Glucose 70 - 99 mg/dL 92  098  119   BUN 8 - 23 mg/dL 47  50  51   Creatinine 0.44 - 1.00 mg/dL 1.47  8.29  5.62   Sodium 135 - 145 mmol/L 140  141  139   Potassium 3.5 - 5.1 mmol/L 3.8  4.3  4.3   Chloride 98 - 111 mmol/L 104  108  105   CO2 22 - 32 mmol/L 30  27  30    Calcium 8.9 - 10.3 mg/dL 9.5  9.4  9.4        Latest Ref Rng & Units 12/10/2023    5:27 PM 11/27/2023    1:46 PM 10/15/2023   12:54 PM  CBC  WBC 4.0 - 10.5 K/uL 8.3  10.9  9.3   Hemoglobin 12.0 - 15.0 g/dL 13.0  86.5  78.4   Hematocrit 36.0 -  46.0 % 36.6  40.0  36.2   Platelets 150 - 400 K/uL 239  250  238     Lab Results  Component Value Date   CHOL 235 (H) 01/12/2019   HDL 92 01/12/2019   LDLCALC 117 (H) 01/12/2019   TRIG 134 06/05/2023   CHOLHDL 2.6 01/12/2019    Lab Results  Component Value Date   TSH 0.738 11/27/2023    Lab Results  Component Value Date   HGBA1C 7.3 (H) 06/03/2023    Body fluid cell count with differential [[id number]] (Abnormal) Collected: 12/10/23 1530  Specimen: Body Fluid from [facility] Misc. fluid Updated: 12/10/23 1936   Fluid Type-FCT PARACARDIAL   Color, Fluid RED Abnormal    Appearance, Fluid TURBID Abnormal    Total Nucleated Cell Count, Fluid 111 cu mm    Neutrophil Count, Fluid 33 High  %    Lymphs, Fluid 57 %    Monocyte-Macrophage-Serous Fluid 8 Low  %    Eos, Fluid 1 %    Other Cells, Fluid 1 BASOPHIL %     BNP (last 3 results) Recent Labs    08/26/23 0053  BNP 98.3    Intake/Output Summary (Last 24 hours) at 12/11/2023 7829 Last data filed at 12/11/2023 0700 Gross per 24 hour  Intake --  Output 115 ml  Net -115 ml    Net IO Since Admission: -115 mL [12/11/23 0839]  Tele/EKG/Cardiac studies    EKG:  EKG 12/10/2023: Normal sinus rhythm at rate of 66 bpm,  normal axis, inferior infarct old, incomplete right bundle branch block.  Poor R progression, cannot exclude anteroseptal infarct old.  Low-voltage chest leads.  No significant change from 08/26/2023.  CARDIAC CATHETERIZATION 12/10/2023   Narrative 1.  Successful pericardiocentesis and drain placement.  With 600 cc of serosanguineous fluid evacuated.  The opening pressure mean was 10 mmHg and the closing pressure was 1 mmHg with increased respiratory variation.   Recommendation: Admitted to heart with drain in place.  ECHOCARDIOGRAM COMPLETE 11/19/2023   1. Left ventricular ejection fraction, by estimation, is 50 to 55%. The left ventricle has low normal function. The left ventricle has no regional wall motion abnormalities. Left ventricular diastolic parameters are consistent with Grade I diastolic  dysfunction (impaired relaxation).  2. Right ventricular systolic function is normal. The right ventricular size is normal.  3. Large pericardial effusion. The pericardial effusion is circumferential.  4. The mitral valve is normal in structure. No evidence of mitral valve regurgitation. No evidence of mitral stenosis.  5. The aortic valve is tricuspid. There is moderate calcification of the aortic valve. Aortic valve regurgitation is mild. Aortic valve sclerosis/calcification is present, without any evidence of aortic stenosis.  6. The inferior vena cava is normal in size with greater than 50% respiratory variability, suggesting right atrial pressure of 3 mmHg.  Conclusion(s)/Recommendation(s): There is a large complex circumfrential periacardial effusion with very mild compression of the RV but no significant respiratory variation in mitral inflow velocity. IVC is normal size. No evidence of tamponade. Effusion  has increased significantly since previous. Clinical team aware.   Radiology    Current Meds:     Current Facility-Administered Medications:    (feeding supplement) PROSource Plus  liquid 30 mL, 30 mL, Oral, QID, Perlie Gold, [name], 30 mL at 12/10/23 1848   acetaminophen (TYLENOL) tablet 650 mg, 650 mg, Oral, Q4H PRN, Perlie Gold, [name]   albuterol (PROVENTIL) (2.5 MG/3ML) 0.083% nebulizer solution 2.5 mg, 2.5 mg, Nebulization, Q6H PRN,  Pham, Minh Q, RPH-CPP   dexamethasone (DECADRON) tablet 1 mg, 1 mg, Oral, Daily, Perlie Gold, [name]   fentaNYL (SUBLIMAZE) injection, , , PRN, Orbie Pyo, MD, 25 mcg at 12/10/23 1549   levETIRAcetam (KEPPRA) tablet 1,000 mg, 1,000 mg, Oral, BID, Perlie Gold, [name], 500 mg at 12/10/23 2118   magnesium oxide (MAG-OX) tablet 400 mg, 400 mg, Oral, BID, Perlie Gold, [name]   meclizine (ANTIVERT) tablet 50 mg, 50 mg, Oral, TID, Perlie Gold, [name], 50 mg at 12/10/23 1744   metoprolol succinate (TOPROL-XL) 24 hr tablet 25 mg, 25 mg, Oral, Daily, Perlie Gold, [name]   midazolam (VERSED) injection, , , PRN, Orbie Pyo, MD, 1 mg at 12/10/23 1549   OLANZapine (ZYPREXA) tablet 2.5 mg, 2.5 mg, Oral, QHS, Williams, Evan, [name]   ondansetron [facility]) injection 4 mg, 4 mg, Intravenous, Q6H PRN, Perlie Gold, [name], 4 mg at 12/10/23 2045   Oral care mouth rinse, 15 mL, Mouth Rinse, PRN, Orbie Pyo, MD   oxyCODONE (Oxy IR/ROXICODONE) immediate release tablet 5 mg, 5 mg, Oral, Q4H PRN, Perlie Gold, [name], 5 mg at 12/11/23 0754   oxyCODONE (OXYCONTIN) 12 hr tablet 15 mg, 15 mg, Oral, Q12H, Perlie Gold, [name], 15 mg at 12/11/23 0452   pantoprazole (PROTONIX) EC tablet 40 mg, 40 mg, Oral, Daily, Perlie Gold, [name]   promethazine (PHENERGAN) tablet 12.5 mg, 12.5 mg, Oral, Q8H PRN, Elmon Kirschner, MD, 12.5 mg at 12/11/23 0034   spironolactone (ALDACTONE) tablet 25 mg, 25 mg, Oral, Daily, Perlie Gold, [name]  Assessment & Plan .     1.  Pericardial effusion SP pericardiocentesis, initial pathology appears to be transudative fluid with no significant RBCs and WBCs. 2.  Stage IV non-small cell lung cancer presently on bevacizumab  which supposedly can cause pericardial effusion.  In the absence of frank bloody effusion, do not think micro seeding is an etiology and hence may suggest drug-induced pericardial effusion. 3.  Primary hypercoagulable state on chronic Eliquis  Recommendations: Patient had about 100 mL of pericardial fluid drained this morning, will recheck this afternoon to see if effusion has been completely drained and then try to discontinue indwelling pericardiocentesis catheter.  Will hold off on starting anticoagulants for now until effusion is completely clear.  Will make the patient sit up on the chair to mobilize the fluid.   For questions or updates, please contact [facility] Please consult [email or web address] for contact info under        Signed, Yates Decamp, MD, [facility] 12/11/2023, 8:39 AM [facility] [address] [place], [place] [address] Phone: [phone]. Fax:  [phone]  Cell: [phone]   Addendum: I rounded on her again around 630 this evening, she has not had any significant drainage from the pericardial drain, had about 15 mL.  I performed bedside echocardiogram which reveals very small residual pericardial effusion but there is also thrombus on top of the pericardium anteriorly which will not drain but will have to manage her conservatively for now.  In view of high risk chance of recurrence of pericardial effusion although no significant data to support, I will start her on colchicine 0.6 mg daily.  She will be discharged home tomorrow if she remains stable.  Under sterile precautions, sutures were removed and pericardial drain was withdrawn out of the body without any complications.  Sterile dressing applied.    Yates Decamp, MD, [facility] 12/11/2023, 7:13 PM [facility] [address] [place], [place] [address] Phone:  [phone]. Fax:  [phone]

## 2023-12-11 NOTE — Plan of Care (Signed)
  Problem: Education: Goal: Knowledge of General Education information will improve Description: Including pain rating scale, medication(s)/side effects and non-pharmacologic comfort measures Outcome: Progressing   Problem: Clinical Measurements: Goal: Ability to maintain clinical measurements within normal limits will improve Outcome: Progressing Goal: Respiratory complications will improve Outcome: Progressing Goal: Cardiovascular complication will be avoided Outcome: Progressing   Problem: Nutrition: Goal: Adequate nutrition will be maintained Outcome: Progressing   Problem: Coping: Goal: Level of anxiety will decrease Outcome: Progressing   Problem: Elimination: Goal: Will not experience complications related to bowel motility Outcome: Progressing Goal: Will not experience complications related to urinary retention Outcome: Progressing   Problem: Safety: Goal: Ability to remain free from injury will improve Outcome: Progressing   Problem: Skin Integrity: Goal: Risk for impaired skin integrity will decrease Outcome: Progressing   Problem: Activity: Goal: Risk for activity intolerance will decrease Outcome: Not Progressing   Problem: Pain Managment: Goal: General experience of comfort will improve and/or be controlled Outcome: Not Progressing

## 2023-12-11 NOTE — TOC Initial Note (Signed)
 Transition of Care [facility]) - Initial/Assessment Note    Patient Details  Name: Virginia [name] MRN: [id number] Date of Birth: 09-Dec-1955  [facility]) CM/SW Contact:    Gala Lewandowsky, RN Phone Number: 12/11/2023, 11:45 AM  Clinical Narrative: Risk for readmission assessment completed. Patient presented for pericardial effusion. PTA patient was from home with spouse. Patient has DME rolling walker and wheelchair. Patient is active with [facility] for PT-adding Aide for bath. Patient will need orders and F2F. Spouse at the bedside during the visit. Case Manager will continue to follow for additional transition of care needs.                 Expected Discharge Plan: Home w Home Health Services Barriers to Discharge: Continued Medical Work up   Patient Goals and CMS Choice Patient states their goals for this hospitalization and ongoing recovery are:: plan to return home once stable  Expected Discharge Plan and Services   Discharge Planning Services: CM Consult Post Acute Care Choice: Home Health, Resumption of Svcs/PTA Provider Living arrangements for the past 2 months: Single Family Home   Prior Living Arrangements/Services Living arrangements for the past 2 months: Single Family Home Lives with:: Spouse Patient language and need for interpreter reviewed:: Yes Do you feel safe going back to the place where you live?: Yes      Need for Family Participation in Patient Care: Yes (Comment) Care giver support system in place?: Yes (comment) Current home services: DME (wheelchair, rolling walker,) Criminal Activity/Legal Involvement Pertinent to Current Situation/Hospitalization: No - Comment as needed  Activities of Daily Living   ADL Screening (condition at time of admission) Independently performs ADLs?: No Does the patient have a NEW difficulty with bathing/dressing/toileting/self-feeding that is expected to last >3 days?: No Does the patient have a NEW  difficulty with getting in/out of bed, walking, or climbing stairs that is expected to last >3 days?: No Does the patient have a NEW difficulty with communication that is expected to last >3 days?: No Is the patient deaf or have difficulty hearing?: No Does the patient have difficulty seeing, even when wearing glasses/contacts?: No Does the patient have difficulty concentrating, remembering, or making decisions?: No  Permission Sought/Granted Permission sought to share information with : Family Supports, Case Production designer, theatre/television/film, Oceanographer granted to share information with : Yes, Verbal Permission Granted     Permission granted to share info w AGENCY: [facility]   Emotional Assessment Appearance:: Appears stated age Attitude/Demeanor/Rapport: Engaged Affect (typically observed): Appropriate Orientation: : Oriented to Self, Oriented to Place Alcohol / Substance Use: Not Applicable Psych Involvement: No (comment)  Admission diagnosis:  Pericardial effusion [I31.39] Patient Active Problem List   Diagnosis Date Noted   Pericardial effusion 12/10/2023   Acute pulmonary embolism (HCC) 08/26/2023   Acute respiratory failure with hypoxia (HCC) 08/26/2023   SIRS (systemic inflammatory response syndrome) (HCC) 08/26/2023   Single subsegmental pulmonary embolism without acute cor pulmonale (HCC) 08/26/2023   Heart failure with reduced ejection fraction (HCC) 08/26/2023   Chronic pain 08/26/2023   Essential hypertension 08/26/2023   Type 2 diabetes mellitus with complication, without long-term current use of insulin (HCC) 08/26/2023   Renal insufficiency 08/26/2023   Hypomagnesemia 06/30/2023   History of DVT (deep vein thrombosis) 06/10/2023   Urine retention 06/10/2023   Acute blood loss anemia 06/10/2023   History of ventricular fibrillation 06/10/2023   Cardiac arrest (HCC) 06/03/2023   Need for emotional support 05/09/2023   Medication management  05/09/2023    Nausea 05/09/2023   DNR (do not resuscitate) 05/09/2023   Palliative care encounter 05/09/2023   Debility 05/08/2023   Counseling and coordination of care 05/08/2023   AKI (acute kidney injury) (HCC) 05/03/2023   Anemia 05/03/2023   Cancer associated pain 11/14/2021   Rash 11/14/2021   Hyponatremia 11/14/2021   Skin rash 11/13/2021   Hypokalemia 11/13/2021   Febrile illness 11/13/2021   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   Port-A-Cath in place 12/15/2020   Lung cancer metastatic to brain (HCC) 12/02/2020   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) 03/15/2020   Goals of care, counseling/discussion 02/25/2020   Palliative care patient 12/16/2019   Malignant neoplasm metastatic to bone (HCC) 10/21/2019   Malignant neoplasm metastatic to brain (HCC) 10/15/2019   Right lower lobe lung mass 09/25/2019   COPD (chronic obstructive pulmonary disease) (HCC) 09/25/2019   Impaired glucose tolerance 12/08/2017   Vitamin D deficiency 12/08/2017   Microscopic hematuria 04/01/2014   Hypertension 01/11/2012   Palpitations 01/11/2012   Hyperlipidemia 12/25/2011   History of smoking 12/25/2011   PCP:  [name], MD Pharmacy:   [facility] [phone] - [place] Otto, [place] - [address] AT [facility] [address] [place] [address] Phone: [phone] Fax: [phone]   Social Drivers of Health (SDOH) Social History: SDOH Screenings   Food Insecurity: No Food Insecurity (12/10/2023)  Housing: Low Risk  (12/10/2023)  Transportation Needs: No Transportation Needs (12/10/2023)  Utilities: Not At Risk (12/10/2023)  Depression (PHQ2-9): Low Risk  (07/15/2023)  Social Connections: Moderately Integrated (12/10/2023)  Tobacco Use: Medium Risk (12/10/2023)   Readmission Risk Interventions    12/11/2023   11:41 AM 06/06/2023    3:51 PM 05/07/2023    1:35 PM  Readmission Risk Prevention Plan  Transportation Screening Complete  Complete Complete  PCP or Specialist Appt within 3-5 Days   Complete  HRI or Home Care Consult   Complete  Social Work Consult for Recovery Care Planning/Counseling   Complete  Palliative Care Screening   Not Applicable  Medication Review Oceanographer) Complete Complete Complete  HRI or Home Care Consult Complete Complete   SW Recovery Care/Counseling Consult Complete Complete   Palliative Care Screening -- Complete   Skilled Nursing Facility Not Applicable Not Applicable

## 2023-12-12 ENCOUNTER — Telehealth: Payer: Self-pay | Admitting: Cardiology

## 2023-12-12 ENCOUNTER — Telehealth: Payer: Self-pay | Admitting: Internal Medicine

## 2023-12-12 DIAGNOSIS — C781 Secondary malignant neoplasm of mediastinum: Secondary | ICD-10-CM | POA: Diagnosis not present

## 2023-12-12 DIAGNOSIS — C3490 Malignant neoplasm of unspecified part of unspecified bronchus or lung: Secondary | ICD-10-CM | POA: Diagnosis not present

## 2023-12-12 DIAGNOSIS — I3131 Malignant pericardial effusion in diseases classified elsewhere: Secondary | ICD-10-CM

## 2023-12-12 DIAGNOSIS — I3139 Other pericardial effusion (noninflammatory): Secondary | ICD-10-CM | POA: Diagnosis not present

## 2023-12-12 LAB — BASIC METABOLIC PANEL WITH GFR
Anion gap: 10 (ref 5–15)
BUN: 43 mg/dL — ABNORMAL HIGH (ref 8–23)
CO2: 26 mmol/L (ref 22–32)
Calcium: 8.7 mg/dL — ABNORMAL LOW (ref 8.9–10.3)
Chloride: 98 mmol/L (ref 98–111)
Creatinine, Ser: 1.58 mg/dL — ABNORMAL HIGH (ref 0.44–1.00)
GFR, Estimated: 36 mL/min — ABNORMAL LOW (ref >60)
Glucose, Bld: 163 mg/dL — ABNORMAL HIGH (ref 70–99)
Potassium: 3.8 mmol/L (ref 3.5–5.1)
Sodium: 134 mmol/L — ABNORMAL LOW (ref 135–145)

## 2023-12-12 LAB — CBC
HCT: 38.8 % (ref 36.0–46.0)
Hemoglobin: 13.3 g/dL (ref 12.0–15.0)
MCH: 32.8 pg (ref 26.0–34.0)
MCHC: 34.3 g/dL (ref 30.0–36.0)
MCV: 95.8 fL (ref 80.0–100.0)
Platelets: 246 10*3/uL (ref 150–400)
RBC: 4.05 MIL/uL (ref 3.87–5.11)
RDW: 12.3 % (ref 11.5–15.5)
WBC: 15.6 10*3/uL — ABNORMAL HIGH (ref 4.0–10.5)
nRBC: 0 % (ref 0.0–0.2)

## 2023-12-12 MED ORDER — COLCHICINE 0.6 MG PO TABS: 0.6000 mg | ORAL_TABLET | Freq: Every day | ORAL | 0 refills | Status: DC

## 2023-12-12 NOTE — TOC Transition Note (Signed)
 Transition of Care [facility]) - Discharge Note   Patient Details  Name: Virginia [name] MRN: [id number] Date of Birth: 12-Jul-1956  Transition of Care [facility]) CM/SW Contact:  Virginia Cousin, RN Phone Number: [phone] 12/12/2023, 11:56 AM   Clinical Narrative:     TOC CM contacted [facility] rep, Adele Dan for resumption of care orders for HHPT and HH aide. Message sent to attending for [facility] orders.   Final next level of care: Home w Home Health Services Barriers to Discharge: No Barriers Identified   Patient Goals and CMS Choice Patient states their goals for this hospitalization and ongoing recovery are:: plan to return home once stable          Discharge Placement                       Discharge Plan and Services Additional resources added to the After Visit Summary for     Discharge Planning Services: CM Consult Post Acute Care Choice: Home Health, Resumption of Svcs/PTA Provider                    HH Arranged: PT, Nurse's Aide HH Agency: [facility] Date HH Agency Contacted: 12/12/23 Time HH Agency Contacted: 1155 Representative spoke with at [facility] Agency: Adele Dan  Social Drivers of Health (SDOH) Interventions SDOH Screenings   Food Insecurity: No Food Insecurity (12/10/2023)  Housing: Low Risk  (12/10/2023)  Transportation Needs: No Transportation Needs (12/10/2023)  Utilities: Not At Risk (12/10/2023)  Depression (PHQ2-9): Low Risk  (07/15/2023)  Social Connections: Moderately Integrated (12/10/2023)  Tobacco Use: Medium Risk (12/10/2023)     Readmission Risk Interventions    12/11/2023   11:41 AM 06/06/2023    3:51 PM 05/07/2023    1:35 PM  Readmission Risk Prevention Plan  Transportation Screening Complete Complete Complete  PCP or Specialist Appt within 3-5 Days   Complete  HRI or Home Care Consult   Complete  Social Work Consult for Recovery Care Planning/Counseling   Complete  Palliative Care Screening   Not Applicable  Medication  Review Oceanographer) Complete Complete Complete  HRI or Home Care Consult Complete Complete   SW Recovery Care/Counseling Consult Complete Complete   Palliative Care Screening -- Complete   Skilled Nursing Facility Not Applicable Not Applicable

## 2023-12-12 NOTE — Telephone Encounter (Signed)
   Transition of Care Follow-up Phone Call Request    Patient Name: Virginia [name] Date of Birth: 1956/05/31 Date of Encounter: 12/12/2023  Primary Care Provider:  [name], MD Primary Cardiologist:  None  [name] has been scheduled for a transition of care follow up appointment with a [facility] provider:  Perlie Gold 4/18  Please reach out to [name] within 48 hours of discharge to confirm appointment and review transition of care protocol questionnaire. Anticipated discharge date: 4/3  Laverda Page, NP  12/12/2023, 11:02 AM

## 2023-12-12 NOTE — Progress Notes (Signed)
 DISCHARGE NOTE HOME Virginia [name] to be discharged Home per MD order. Discussed prescriptions and follow up appointments with the patient. Prescriptions given to patient; medication list explained in detail. Patient verbalized understanding.  Skin clean, dry and intact without evidence of skin break down, no evidence of skin tears noted. IV catheter discontinued intact. Site without signs and symptoms of complications. Dressing and pressure applied. Pt denies pain at the site currently. No complaints noted.  See LDA for incision being discharged with.  An After Visit Summary (AVS) was printed and given to the patient. Patient escorted via wheelchair, and discharged home via private auto.  Velia Meyer, RN,

## 2023-12-12 NOTE — Discharge Summary (Signed)
 Physician Discharge Summary      Patient ID: MCKAYLIN BASTIEN MRN: [id number] DOB/AGE: Feb 07, 1956 68 y.o. 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  Poor R progression, cannot exclude anteroseptal infarct old.  Low-voltage chest leads.  No significant change from 08/26/2023.   Pericardiocentesis 12/10/2023  1.  Successful pericardiocentesis and drain placement.  With 600 cc of serosanguineous fluid evacuated.  The opening pressure mean was 10 mmHg and the closing pressure was 1 mmHg with increased respiratory variation.   Recommendation: Admitted to heart with drain in place.    Outpatient ECHOCARDIOGRAM COMPLETE 11/19/2023   1. Left ventricular ejection fraction, by estimation, is 50 to 55%. The left ventricle has low normal function. The left ventricle has no regional wall motion abnormalities. Left ventricular diastolic parameters are consistent with Grade I diastolic  dysfunction (impaired relaxation).  2. Right ventricular systolic function is normal. The right ventricular size is normal.  3. Large pericardial effusion. The pericardial effusion is circumferential.  4. The mitral valve is normal in structure. No evidence of mitral valve regurgitation. No evidence of mitral stenosis.  5. The aortic valve is tricuspid. There is moderate calcification of the aortic valve. Aortic valve regurgitation is mild. Aortic valve sclerosis/calcification is present, without any evidence of aortic stenosis.  6. The inferior vena cava is normal in size with greater than  50% respiratory variability, suggesting right atrial pressure of 3 mmHg.   Conclusion(s)/Recommendation(s): There is a large complex circumfrential periacardial effusion with very mild compression of the RV but no significant respiratory variation in mitral inflow velocity. IVC is normal size. No evidence of tamponade. Effusion  has increased significantly since previous.   Hospital Course: Virginia [name] is a 68 y.o. female  patient Virginia [name] is a 68 y.o. female patient with non-small cell lung cancer with metastasis, presently on bevacizumab, developed moderate pericardial effusion that needed pericardiocentesis on 12/10/2023.   Recommendations on discharge: This morning patient's bedside echocardiogram reveals very mild pericardial effusion with presence of epicardial organized thrombus that is very small, unchanged from yesterday.  No complications from pericardiocentesis.  She will be discharged home today, she was started empirically on colchicine 0.6 mg daily.  Will arrange for follow-up with Dr. Lynnette Caffey.  Discharge Exam:    12/12/2023    8:00 AM 12/12/2023    7:00 AM 12/12/2023    6:00 AM  Vitals with BMI  Systolic 108 121 409  Diastolic 78 87 80  Pulse 110 96 96     Physical Exam Constitutional:      Appearance: She is obese.  Neck:     Vascular: No carotid bruit or JVD.  Cardiovascular:     Rate and Rhythm: Normal rate and regular rhythm.     Pulses: Intact distal pulses.     Heart sounds: Normal heart sounds. No murmur heard.    No gallop.  Pulmonary:     Effort: Pulmonary effort is normal.     Breath sounds: Examination of the left-middle field reveals rales. Examination of the right-lower field reveals rales. Examination of the left-lower field reveals rales. Rales present.  Abdominal:     General: Bowel sounds are normal.     Palpations: Abdomen is soft.  Musculoskeletal:     Right lower leg: No edema.     Left lower leg: No edema.     Labs:   Lab  Results  Component Value Date   WBC 15.6 (H) 12/12/2023   HGB 13.3 12/12/2023   HCT 38.8 12/12/2023   MCV 95.8 12/12/2023   PLT 246 12/12/2023    Recent Labs  Lab 12/12/23 0214  NA 134*  K 3.8  CL 98  CO2 26  BUN 43*  CREATININE 1.58*  CALCIUM 8.7*  GLUCOSE 163*   LD, Body Fluid 443    Comment: (NOTE)             _________________________________________            : BODY FLUID TYPE :          LDH          :            :_________________:_______________________:            :                 : Nonmalignant:  < 60%  :            :                 :  of the serum LDH     :            : Ascitic Fluid   : Malignant:     > 60%  :            :                 :  of the serum LDH     :            :_________________:_______________________:            : Gastric Juice   :                < 35   :            :_________________:_______________________:            :                 : Transudate:    <200   :            : Pleural Fluid   : Exudate:       >200   :            :_________________:_______________________:            : Saliva          :           113 - 609   :            : (Mixed Glands)  :                       :            :_________________:_______________________:            : Synovial Fluid  :                <240   :            :_________________:_______________________:   Pericardial  fluid protein 3.3 again suggests probable malignant effusion Total Protein, Body Fluid Other 3.3    Comment: (NOTE)             _________________________________________            : BODY FLUID TYPE :     TOTAL PROTEIN     :            :_________________:_______________________:            : Amniotic Fluid  :               <0.4    :            :_________________:_______________________:            :                 : Nonmalignant: <3.0    :            : Ascitic Fluid   : Malignant:    >3.0    :            :_________________:_______________________:   Cultures negative so far, Gram  stain negative so far.   FOLLOW UP PLANS AND APPOINTMENTS  Allergies as of 12/12/2023       Reactions   Scopolamine Rash        Medication List     TAKE these medications    (feeding supplement) PROSource Plus liquid Take 30 mLs by mouth 2 (two) times daily between meals. What changed:  when to take this additional instructions   acetaminophen 325 MG tablet Commonly known as: TYLENOL Take 1-2 tablets (325-650 mg total) by mouth every 4 (four) hours as needed for mild pain (pain score 1-3). What changed: how much to take   albuterol 108 (90 Base) MCG/ACT inhaler Commonly known as: VENTOLIN HFA INHALE 2 PUFFS INTO THE LUNGS EVERY 6 HOURS AS NEEDED FOR WHEEZING OR SHORTNESS OF BREATH   ALPRAZolam 0.25 MG tablet Commonly known as: XANAX Take 1 tablet (0.25 mg total) by mouth 2 (two) times daily as needed for anxiety.   apixaban 5 MG Tabs tablet Commonly known as: ELIQUIS Take 1 tablet (5 mg total) by mouth 2 (two) times daily.   colchicine 0.6 MG tablet Take 1 tablet (0.6 mg total) by mouth daily.   dexamethasone 1 MG tablet Commonly known as: DECADRON Take 1 tablet (1 mg total) by mouth daily. What changed:  when to take this additional instructions   hydrocortisone 2.5 % rectal cream Commonly known as: ANUSOL-HC use in rectal area 2 to 3 times a day as needed   levETIRAcetam 1000 MG tablet Commonly known as: KEPPRA Take 1 tablet (1,000 mg total) by mouth 2 (two) times daily.   magnesium oxide 400 (240 Mg) MG tablet Commonly known as: MAG-OX Take 1 tablet (400 mg total) by mouth 2 (two) times daily.   meclizine 50 MG tablet Commonly known as: ANTIVERT Take 1 tablet (50 mg total) by mouth 3 (three) times daily. What changed: how much to take   metoprolol succinate 25 MG 24 hr tablet Commonly known as: TOPROL-XL Take 1 tablet (25 mg total) by mouth daily.   multivitamin with minerals Tabs tablet Take 2 tablets by mouth daily. Gummy   OLANZapine 2.5  MG tablet Commonly known as: ZYPREXA Take 1 tablet (2.5 mg total) by mouth at bedtime.   ondansetron 4 MG tablet Commonly known as: ZOFRAN TAKE 1 TABLET(4 MG) BY MOUTH EVERY  8 HOURS AS NEEDED FOR NAUSEA OR VOMITING   oxyCODONE 5 MG immediate release tablet Commonly known as: Oxy IR/ROXICODONE Take 1 tablet (5 mg total) by mouth every 4 (four) hours as needed for severe pain (pain score 7-10).   pantoprazole 40 MG tablet Commonly known as: PROTONIX TAKE 1 TABLET(40 MG) BY MOUTH DAILY   polyethylene glycol 17 g packet Commonly known as: MIRALAX / GLYCOLAX Take 17 g by mouth daily as needed for moderate constipation or mild constipation.   potassium chloride SA 20 MEQ tablet Commonly known as: KLOR-CON M Take 1 tablet (20 mEq total) by mouth 2 (two) times daily.   senna-docusate 8.6-50 MG tablet Commonly known as: Senokot-S Take 2 tablets by mouth daily as needed (Patient preference, Sennakot S for mild constipation). What changed:  reasons to take this additional instructions   spironolactone 25 MG tablet Commonly known as: ALDACTONE Take 1 tablet (25 mg total) by mouth daily.   VITAMIN B12 PO Take 5,000 mcg by mouth daily. Gummies   Vitamin D3 125 MCG (5000 UT) Tabs Take 5,000 Units by mouth daily.   Xtampza ER 13.5 MG C12a Generic drug: oxyCODONE ER Take 1 capsule by mouth in the morning and at bedtime.        Follow-up Information     [place], [facility] Follow up.   Why: Physical Therapy-Aide office to call with visit times. Contact information: [address] [place] [address] [phone]                    Yates Decamp, MD, [facility] 12/12/2023, 9:05 AM [facility] [address] [place], [place] [address] Phone: [phone]. Fax:  [phone]

## 2023-12-12 NOTE — Telephone Encounter (Signed)
 Error

## 2023-12-12 NOTE — Plan of Care (Signed)
  Problem: Education: Goal: Knowledge of General Education information will improve Description: Including pain rating scale, medication(s)/side effects and non-pharmacologic comfort measures 12/12/2023 0622 by Sinda Du, RN Outcome: Progressing 12/12/2023 0619 by Sinda Du, RN Outcome: Progressing   Problem: Health Behavior/Discharge Planning: Goal: Ability to manage health-related needs will improve Outcome: Progressing   Problem: Clinical Measurements: Goal: Ability to maintain clinical measurements within normal limits will improve 12/12/2023 0622 by Sinda Du, RN Outcome: Progressing 12/12/2023 0619 by Sinda Du, RN Outcome: Progressing Goal: Will remain free from infection 12/12/2023 0622 by Sinda Du, RN Outcome: Progressing 12/12/2023 0619 by Sinda Du, RN Outcome: Progressing Goal: Diagnostic test results will improve Outcome: Progressing Goal: Cardiovascular complication will be avoided 12/12/2023 0622 by Sinda Du, RN Outcome: Progressing 12/12/2023 0619 by Sinda Du, RN Outcome: Progressing   Problem: Activity: Goal: Risk for activity intolerance will decrease 12/12/2023 0622 by Sinda Du, RN Outcome: Progressing 12/12/2023 0619 by Sinda Du, RN Outcome: Progressing   Problem: Nutrition: Goal: Adequate nutrition will be maintained 12/12/2023 0622 by Sinda Du, RN Outcome: Progressing 12/12/2023 0619 by Sinda Du, RN Outcome: Progressing   Problem: Coping: Goal: Level of anxiety will decrease Outcome: Progressing   Problem: Elimination: Goal: Will not experience complications related to bowel motility 12/12/2023 0622 by Sinda Du, RN Outcome: Progressing 12/12/2023 0619 by Sinda Du, RN Outcome: Not Progressing Goal: Will not experience complications related to urinary retention 12/12/2023 0622 by Sinda Du, RN Outcome: Progressing 12/12/2023 0619 by Sinda Du,  RN Outcome: Progressing   Problem: Safety: Goal: Ability to remain free from injury will improve Outcome: Progressing   Problem: Skin Integrity: Goal: Risk for impaired skin integrity will decrease Outcome: Progressing   Problem: Clinical Measurements: Goal: Respiratory complications will improve 12/12/2023 0622 by Sinda Du, RN Outcome: Not Progressing 12/12/2023 0619 by Sinda Du, RN Outcome: Progressing   Problem: Pain Managment: Goal: General experience of comfort will improve and/or be controlled 12/12/2023 0622 by Sinda Du, RN Outcome: Not Progressing 12/12/2023 0619 by Sinda Du, RN Outcome: Progressing

## 2023-12-12 NOTE — Plan of Care (Deleted)
  Problem: Education: Goal: Knowledge of General Education information will improve Description: Including pain rating scale, medication(s)/side effects and non-pharmacologic comfort measures Outcome: Progressing   Problem: Clinical Measurements: Goal: Ability to maintain clinical measurements within normal limits will improve Outcome: Progressing Goal: Will remain free from infection Outcome: Progressing Goal: Respiratory complications will improve Outcome: Progressing Goal: Cardiovascular complication will be avoided Outcome: Progressing   Problem: Activity: Goal: Risk for activity intolerance will decrease Outcome: Progressing   Problem: Nutrition: Goal: Adequate nutrition will be maintained Outcome: Progressing   Problem: Elimination: Goal: Will not experience complications related to urinary retention Outcome: Progressing   Problem: Pain Managment: Goal: General experience of comfort will improve and/or be controlled Outcome: Progressing   Problem: Elimination: Goal: Will not experience complications related to bowel motility Outcome: Not Progressing

## 2023-12-13 ENCOUNTER — Telehealth: Payer: Self-pay | Admitting: *Deleted

## 2023-12-13 NOTE — Telephone Encounter (Signed)
 Received call from pt's husband Virginia [name].  He states Virginia [name] was discharged from [facility] yesterday after having a pericardiocentesis on 12/10/23. Virginia [name] states the procedure was hard on her but is doing better overall. He is calling as they are concerned that Virginia [name] might have blood in her urine today. He states it is dark in color. She is on Eliquis 5mg  BID.  Denies fever, dysuria. When asked about her fluid intake, he does state that she drinks maybe 24 oz/per day. Advised that she would need to come in to give [name] a urine sample to confirm presence of blood. He states she does not feel up to coming in today.  Advised to increase fluid intake over the next few days to see if her urine gets lighter in color and then follow up with me on Monday. Advised that they see bright red blood in her urine to call on call provider over the weekend for direction. Virginia [name] voiced understanding.

## 2023-12-15 LAB — CULTURE, BODY FLUID W GRAM STAIN -BOTTLE: Culture: NO GROWTH

## 2023-12-16 ENCOUNTER — Encounter: Payer: Self-pay | Admitting: Internal Medicine

## 2023-12-16 LAB — CYTOLOGY - NON PAP

## 2023-12-16 NOTE — Telephone Encounter (Signed)
 Patient contacted regarding discharge from [facility] on 12/12/2023  Patient understands to follow up with provider Perlie Gold  on 12/27/23 at 08:50 at [facility] Patient understands discharge instructions? Yes Patient understands medications and regiment?Yes Patient understands to bring all medications to this visit? Yes  Ask patient:  Are you enrolled in My Chart Yes

## 2023-12-18 ENCOUNTER — Ambulatory Visit
Admission: RE | Admit: 2023-12-18 | Discharge: 2023-12-18 | Disposition: A | Discharge status: Home / Self Care | Source: Ambulatory Visit | Attending: Internal Medicine

## 2023-12-18 DIAGNOSIS — C3490 Malignant neoplasm of unspecified part of unspecified bronchus or lung: Secondary | ICD-10-CM

## 2023-12-18 MED ORDER — GADOPICLENOL 0.5 MMOL/ML IV SOLN
7.0000 mL | Freq: Once | INTRAVENOUS | Status: AC | PRN
Start: 2023-12-18 — End: 2023-12-18
  Administered 2023-12-18: 7 mL via INTRAVENOUS

## 2023-12-19 ENCOUNTER — Other Ambulatory Visit: Payer: Self-pay | Admitting: Physician Assistant

## 2023-12-19 ENCOUNTER — Encounter: Payer: Self-pay | Admitting: Hematology and Oncology

## 2023-12-19 DIAGNOSIS — R918 Other nonspecific abnormal finding of lung field: Secondary | ICD-10-CM

## 2023-12-19 DIAGNOSIS — C7931 Secondary malignant neoplasm of brain: Secondary | ICD-10-CM

## 2023-12-20 ENCOUNTER — Inpatient Hospital Stay

## 2023-12-20 ENCOUNTER — Inpatient Hospital Stay: Attending: Hematology and Oncology | Admitting: Hematology and Oncology

## 2023-12-20 VITALS — BP 115/83 | HR 72 | Temp 98.1°F | Resp 14 | Wt 167.4 lb

## 2023-12-20 DIAGNOSIS — Z7901 Long term (current) use of anticoagulants: Secondary | ICD-10-CM | POA: Diagnosis not present

## 2023-12-20 DIAGNOSIS — R9082 White matter disease, unspecified: Secondary | ICD-10-CM | POA: Diagnosis not present

## 2023-12-20 DIAGNOSIS — Z8673 Personal history of transient ischemic attack (TIA), and cerebral infarction without residual deficits: Secondary | ICD-10-CM | POA: Insufficient documentation

## 2023-12-20 DIAGNOSIS — Z9049 Acquired absence of other specified parts of digestive tract: Secondary | ICD-10-CM | POA: Insufficient documentation

## 2023-12-20 DIAGNOSIS — R918 Other nonspecific abnormal finding of lung field: Secondary | ICD-10-CM

## 2023-12-20 DIAGNOSIS — R112 Nausea with vomiting, unspecified: Secondary | ICD-10-CM | POA: Insufficient documentation

## 2023-12-20 DIAGNOSIS — I6789 Other cerebrovascular disease: Secondary | ICD-10-CM | POA: Insufficient documentation

## 2023-12-20 DIAGNOSIS — R42 Dizziness and giddiness: Secondary | ICD-10-CM | POA: Diagnosis not present

## 2023-12-20 DIAGNOSIS — C7931 Secondary malignant neoplasm of brain: Secondary | ICD-10-CM | POA: Diagnosis not present

## 2023-12-20 DIAGNOSIS — Z79899 Other long term (current) drug therapy: Secondary | ICD-10-CM | POA: Diagnosis not present

## 2023-12-20 DIAGNOSIS — C3431 Malignant neoplasm of lower lobe, right bronchus or lung: Secondary | ICD-10-CM | POA: Insufficient documentation

## 2023-12-20 DIAGNOSIS — Z90710 Acquired absence of both cervix and uterus: Secondary | ICD-10-CM | POA: Diagnosis not present

## 2023-12-20 DIAGNOSIS — Z86718 Personal history of other venous thrombosis and embolism: Secondary | ICD-10-CM | POA: Insufficient documentation

## 2023-12-20 DIAGNOSIS — E876 Hypokalemia: Secondary | ICD-10-CM | POA: Diagnosis not present

## 2023-12-20 LAB — CBC WITH DIFFERENTIAL (CANCER CENTER ONLY)
Abs Immature Granulocytes: 0.4 10*3/uL — ABNORMAL HIGH (ref 0.00–0.07)
Basophils Absolute: 0.1 10*3/uL (ref 0.0–0.1)
Basophils Relative: 1 %
Eosinophils Absolute: 0.1 10*3/uL (ref 0.0–0.5)
Eosinophils Relative: 1 %
HCT: 37.5 % (ref 36.0–46.0)
Hemoglobin: 12.1 g/dL (ref 12.0–15.0)
Immature Granulocytes: 6 %
Lymphocytes Relative: 25 %
Lymphs Abs: 1.8 10*3/uL (ref 0.7–4.0)
MCH: 31.7 pg (ref 26.0–34.0)
MCHC: 32.3 g/dL (ref 30.0–36.0)
MCV: 98.2 fL (ref 80.0–100.0)
Monocytes Absolute: 0.6 10*3/uL (ref 0.1–1.0)
Monocytes Relative: 8 %
Neutro Abs: 4.2 10*3/uL (ref 1.7–7.7)
Neutrophils Relative %: 59 %
Platelet Count: 369 10*3/uL (ref 150–400)
RBC: 3.82 MIL/uL — ABNORMAL LOW (ref 3.87–5.11)
RDW: 11.9 % (ref 11.5–15.5)
Smear Review: NORMAL
WBC Count: 7.1 10*3/uL (ref 4.0–10.5)
nRBC: 0 % (ref 0.0–0.2)

## 2023-12-20 LAB — CMP (CANCER CENTER ONLY)
ALT: 15 U/L (ref 0–44)
AST: 11 U/L — ABNORMAL LOW (ref 15–41)
Albumin: 3.2 g/dL — ABNORMAL LOW (ref 3.5–5.0)
Alkaline Phosphatase: 43 U/L (ref 38–126)
Anion gap: 7 (ref 5–15)
BUN: 40 mg/dL — ABNORMAL HIGH (ref 8–23)
CO2: 28 mmol/L (ref 22–32)
Calcium: 9 mg/dL (ref 8.9–10.3)
Chloride: 108 mmol/L (ref 98–111)
Creatinine: 1.19 mg/dL — ABNORMAL HIGH (ref 0.44–1.00)
GFR, Estimated: 50 mL/min — ABNORMAL LOW (ref >60)
Glucose, Bld: 137 mg/dL — ABNORMAL HIGH (ref 70–99)
Potassium: 4 mmol/L (ref 3.5–5.1)
Sodium: 143 mmol/L (ref 135–145)
Total Bilirubin: 0.3 mg/dL (ref 0.0–1.2)
Total Protein: 6.5 g/dL (ref 6.5–8.1)

## 2023-12-20 LAB — TSH: TSH: 1.608 u[IU]/mL (ref 0.350–4.500)

## 2023-12-20 NOTE — Progress Notes (Signed)
 [facility] [phone]   [phone]  PROGRESS NOTE  Patient Care Team: Sylvan Evener, MD as PCP - General (Internal Medicine) Ander Bame, MD as PCP - Hematology/Oncology (Hematology and Oncology) Jaye Mettle, RN as Oncology Nurse Navigator Rafe Bunde, DO as Consulting Physician [facility] and Palliative Medicine) Lbcardiology, Zola Hint, MD as Rounding Team (Cardiology) Pickenpack-Cousar, Giles Labrum, NP as Nurse Practitioner (Hospice and Palliative Medicine)  Hematological/Oncological History # 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 CT of the head showed showed concerning for metastatic disease involving the left cerebellum and right occipital lobe.  2) 09/30/2019: MRI brain confirms mass within the left cerebellum measures 1.6 x 1.3 x 1.5 cm as well as at least 8 metastatic lesions within the supratentorial and infratentorial brain as outlined 3) 10/01/2019: PET CT scan revealed hypermetabolic infrahilar right lower lobe mass with hypermetabolic nodal metastases in the right hilum, mediastinum, right supraclavicular region, left axilla and lower left neck. 4) 10/08/2019: US  guided biopsy of the lymph nodes confirms poorly differentiated non-small cell carcinoma. Immunohistochemistry confirms adenocarcinoma of lung primary. PD-L1 and NGS later revealed a MET Exon 14 mutation and TPS of 90%.  5) 10/12/2019: Establish care with Dr. Rosaline Coma  6) 11/04/2019: Day 1 of capmatinib 400mg  BID 7) 11/09/2019: presented to Rad/Onc with a DVT in LLE. Seen in symptom management clinic and started on apixaban.  8) 12/29/2019: CT C/A/P showed interval decrease in size of mass involving the superior segment of right lower lobe and reduction in mediastinal and left supraclavicular adenopathy though some small spinal lesions were noted in the interim between the two sets of  scans 9) 01/25/2020: temporarily stopped capmatinib due to symptoms of nausea/fatigue. Restarted therapy on 01/30/2020 after brief chemo holiday.  10) 03/22/2020: CT C/A/P reveals a mixed picture, with new lung nodule and increased lymphadenopathy, however response in bone lesions. D/c capmatinib therapy 11) 04/08/2020: start of Carbo/Pem/Pem for 2nd line treatment. Cycle 1 Day 1   12) 04/29/2020: Cycle 2 Day 1 Carbo/Pem/Pem  13) 05/20/2020: Cycle 3 Day 1 Carbo/Pem/Pem  14) 06/09/2020: Cycle 4 Day 1 Carbo/Pem/Pem  15) 06/29/2020: Cycle 5 Day 1 maintenance Pem/Pem  16) 07/20/2020: Cycle 6 Day 1 maintenance Pem/Pem  17) 08/12/2020: Cycle 7 Day 1 maintenance Pem/Pem  18) 09/01/2020: Cycle 8 Day 1 maintenance Pem/Pem  19) 09/21/2020: Cycle 9 Day 1 maintenance Pem/Pem  20) 10/13/2020: Cycle 10 Day 1 maintenance Pem/Pem  21) 11/03/2020: Cycle 11 Day 1 maintenance Pem/Pem  22) 11/25/2020:  Cycle 12 Day 1 maintenance Pem/Pem 23) 12/15/2020:  Cycle 13 Day 1 maintenance Pem/Pem 24) 01/05/2021:  Cycle 14 Day 1 maintenance Pem/Pem 25) 01/26/2021:  Cycle 15 Day 1 maintenance Pem/Pem plus Bevacizumab.  02/17/2021: Cycle 16 Day 1 maintenance Pem/Pem plus Bevacizumab.  03/10/2021: Cycle 17 Day 1 maintenance Pem/Pem plus Bevacizumab.  03/31/2021: Cycle 18 Day 1 maintenance Pem/Pem  04/20/2021:  Cycle 19 Day 1 maintenance Pem/Pem  05/12/2021: Cycle 20 Day 1 maintenance Pem/Pem 06/01/2021: Cycle 21 Day 1 maintenance Pem/Pem 06/22/2021: Cycle 22 Day 1 maintenance Pem/Pem 07/13/2021: Cycle 23 Day 1 maintenance Pem/Pem 08/16/2021: Cycle 24 Day 1 maintenance Pem/Pem 09/08/2021: Cycle 25 Day 1 maintenance Pem/Pem plus Bevacizumab 09/28/2021: Cycle 26 Day 1 maintenance Pem/Pem plus Bevacizumab 10/20/2021: Cycle 27 Day 1 maintenance Pem/Pem plus Bevacizumab 11/09/2021: Cycle 28 Day 1 maintenance Pem/Pem  12/06/2021: Cycle 29 Day 1 maintenance Pem/Pem  12/27/2021: Cycle  30 Day 1 maintenance Pem/Pem  01/18/2022: Cycle 31 Day 1 maintenance  Pem/Pem  02/07/2022: Cycle 32 Day 1 maintenance Pem/Pem  02/28/2022: Cycle 33 Day 1 maintenance Pem/Pem  03/23/2022: Cycle 34 Day 1 maintenance Pem/Pem  04/13/2022: Cycle 35 Day 1 maintenance Pem/Pem  05/04/2022: Cycle 36 Day 1 maintenance Pem/Pem  05/25/2022: Cycle 37 Day 1 maintenance Pem/Pem  06/14/2022: Cycle 38 Day 1 maintenance Pem/Pem 07/05/2022: Cycle 39 Day 1 maintenance Pem/Pem 08/13/2022: Cycle 40 Day 1 maintenance Pem/Pem 09/06/2022:Cycle 41 Day 1 maintenance Pem/Pem 09/27/2022: Cycle 42 Day 1 maintenance Pem/Pem 10/19/2022: Cycle 43 Day 1 maintenance Pem/Pem 11/09/2022: Cycle 44 Day 1 maintenance Pem/Pem 11/30/2022: Cycle 45 Day 1 maintenance Pem/Pem 12/20/2022: Cycle 46 Day 1 maintenance Pem/Pem 01/10/2023: Cycle 47 Day 1 maintenance Pem/Pem 01/31/2023:Cycle 48 Day 1 maintenance Pem/Pem 02/22/2023: Cycle 49 Day 1 maintenance Pem/Pem 03/15/2023: Cycle 50 Day 1 maintenance Pem/Pem 04/04/2023: Cycle 51 Day 1 maintenance Pem/Pem 04/26/2023: Cycle 52 Day 1 maintenance Pem/Pem 05/17/2023: Cycle 53 Day 1 maintenance Pem/Pem 07/19/2023: Cycle 54 Day 1 maintenance Pembro (holding Pemetrexed) 08/12/2023: Cycle 55 Day 1 maintenance Pem/Pem 09/02/2023: Cycle 56 Day 1 maintenance Pembro (holding Pemetrexed) 09/23/2023: Cycle 57 Day 1 maintenance Pembro (holding Pemetrexed) 10/15/2023: Cycle 58 Day 1 maintenance Pembro (holding Pemetrexed)  Interval History:  Virginia [name] 68 y.o. female with medical history significant for metastatic adenocarcinoma of the lung presents for a follow up visit. The patient's last visit was on 10/15/2023.  On exam today [name] reports she has been having some issues with dizziness and headache.  She reports that this is more so than usual.  She notes that she is not having any shortness of breath or cough.  She notes that her urine had been dark for a while but then when she began taking in more fluids and normalized.  She is eating well and her energy levels are okay.   She reports that she feels little more weak and is sleeping more often.  Today she notes that she would like to hold on treatment. She denies any fevers, chills, sweats or chest pain. She has no other complaints.  Full 10 point ROS was otherwise negative.  We discussed the results of her brain MRI which did show acute bleeding.  We recommend holding her Eliquis and she will be meeting with Dr. Mark Sil on Monday morning.  We noted that it would be best to hold her treatment today while we sort out the issues with her intracranial bleeding.  She voiced understanding of our findings and plan moving forward.    MEDICAL HISTORY:  Past Medical History:  Diagnosis Date   Anemia    Anxiety    Concussion 09/28/2019   DVT (deep venous thrombosis) (HCC) 2021   L leg   Dyspnea    GERD (gastroesophageal reflux disease)    Hypercholesterolemia    per pt, she does not have elevated lipids   Hypertension    met lung ca dx'd 09/2019   mets to spine, hip and brain   PONV (postoperative nausea and vomiting)    Tobacco abuse     SURGICAL HISTORY: Past Surgical History:  Procedure Laterality Date   ABDOMINAL HYSTERECTOMY     partial/ left ovaries   APPLICATION OF CRANIAL NAVIGATION N/A 12/02/2020   Procedure: APPLICATION OF CRANIAL NAVIGATION;  Surgeon: Manya Sells, MD;  Location: [facility] OR;  Service: Neurosurgery;  Laterality: N/A;   CHOLECYSTECTOMY     CRANIOTOMY N/A 12/02/2020   Procedure: Posterior fossa craniotomy  for tumor resection with brainlab;  Surgeon: Manya Sells, MD;  Location: [facility] OR;  Service: Neurosurgery;  Laterality: N/A;   DILATION AND CURETTAGE OF UTERUS     IR IMAGING GUIDED PORT INSERTION  10/23/2019   IR PATIENT EVAL TECH 0-60 MINS  11/21/2021   IR PATIENT EVAL TECH 0-60 MINS  11/24/2021   IR PATIENT EVAL TECH 0-60 MINS  11/29/2021   IR PATIENT EVAL TECH 0-60 MINS  12/04/2021   IR PATIENT EVAL TECH 0-60 MINS  12/12/2021   IR PATIENT EVAL TECH 0-60 MINS  12/19/2021   IR PATIENT EVAL TECH  0-60 MINS  12/27/2021   IR PATIENT EVAL TECH 0-60 MINS  01/03/2022   IR PATIENT EVAL TECH 0-60 MINS  01/10/2022   IR PATIENT EVAL TECH 0-60 MINS  01/18/2022   IR PATIENT EVAL TECH 0-60 MINS  02/13/2022   IR PATIENT EVAL TECH 0-60 MINS  02/20/2022   IR RADIOLOGIST EVAL & MGMT  01/23/2022   IR REMOVAL TUN ACCESS W/ PORT W/O FL MOD SED  11/17/2021   KYPHOPLASTY N/A 03/15/2020   Procedure: Thoracic Eight KYPHOPLASTY;  Surgeon: Manya Sells, MD;  Location: [facility] OR;  Service: Neurosurgery;  Laterality: N/A;  prone    LIPOMA EXCISION  2018   removed under left breast and right thigh.   PERICARDIOCENTESIS N/A 12/10/2023   Procedure: PERICARDIOCENTESIS;  Surgeon: Kyra Phy, MD;  Location: [facility] INVASIVE CV LAB;  Service: Cardiovascular;  Laterality: N/A;   TUBAL LIGATION      ALLERGIES:  is allergic to scopolamine.  MEDICATIONS:  Current Outpatient Medications  Medication Sig Dispense Refill   acetaminophen (TYLENOL) 325 MG tablet Take 1-2 tablets (325-650 mg total) by mouth every 4 (four) hours as needed for mild pain (pain score 1-3). (Patient taking differently: Take 500 mg by mouth every 4 (four) hours as needed for mild pain (pain score 1-3).)     albuterol (VENTOLIN HFA) 108 (90 Base) MCG/ACT inhaler INHALE 2 PUFFS INTO THE LUNGS EVERY 6 HOURS AS NEEDED FOR WHEEZING OR SHORTNESS OF BREATH 6.7 g 11   ALPRAZolam (XANAX) 0.25 MG tablet Take 1 tablet (0.25 mg total) by mouth 2 (two) times daily as needed for anxiety. 30 tablet 0   apixaban (ELIQUIS) 5 MG TABS tablet Take 1 tablet (5 mg total) by mouth 2 (two) times daily. (Patient not taking: Reported on 12/20/2023) 180 tablet 1   Cholecalciferol (VITAMIN D3) 125 MCG (5000 UT) TABS Take 5,000 Units by mouth daily.     colchicine 0.6 MG tablet Take 1 tablet (0.6 mg total) by mouth daily. 90 tablet 0   Cyanocobalamin (VITAMIN B12 PO) Take 5,000 mcg by mouth daily. Gummies     dexamethasone (DECADRON) 1 MG tablet Take 1 tablet (1 mg total) by mouth daily.  (Patient taking differently: Take 1 mg by mouth See admin instructions. Every 30 hours) 90 tablet 0   hydrocortisone (ANUSOL-HC) 2.5 % rectal cream use in rectal area 2 to 3 times a day as needed 30 g PRN   levETIRAcetam (KEPPRA) 1000 MG tablet Take 1 tablet (1,000 mg total) by mouth 2 (two) times daily. 180 tablet 2   magnesium oxide (MAG-OX) 400 (240 Mg) MG tablet Take 1 tablet (400 mg total) by mouth 2 (two) times daily. 60 tablet 0   meclizine (ANTIVERT) 50 MG tablet Take 1 tablet (50 mg total) by mouth 3 (three) times daily. (Patient taking differently: Take 100 mg by mouth 3 (three) times daily.) 90  tablet 2   metoprolol succinate (TOPROL-XL) 25 MG 24 hr tablet Take 1 tablet (25 mg total) by mouth daily. 90 tablet 3   Multiple Vitamin (MULTIVITAMIN WITH MINERALS) TABS tablet Take 2 tablets by mouth daily. Gummy     Nutritional Supplements (,FEEDING SUPPLEMENT, PROSOURCE PLUS) liquid Take 30 mLs by mouth 2 (two) times daily between meals. (Patient taking differently: Take 30 mLs by mouth 4 (four) times daily. Berry flavor)     OLANZapine (ZYPREXA) 2.5 MG tablet Take 1 tablet (2.5 mg total) by mouth at bedtime. 90 tablet 1   ondansetron (ZOFRAN) 4 MG tablet TAKE 1 TABLET(4 MG) BY MOUTH EVERY 8 HOURS AS NEEDED FOR NAUSEA OR VOMITING 40 tablet 2   oxyCODONE (OXY IR/ROXICODONE) 5 MG immediate release tablet Take 1 tablet (5 mg total) by mouth every 4 (four) hours as needed for severe pain (pain score 7-10). 90 tablet 0   pantoprazole (PROTONIX) 40 MG tablet TAKE 1 TABLET(40 MG) BY MOUTH DAILY 90 tablet 3   polyethylene glycol (MIRALAX / GLYCOLAX) 17 g packet Take 17 g by mouth daily as needed for moderate constipation or mild constipation.     potassium chloride SA (KLOR-CON M) 20 MEQ tablet Take 1 tablet (20 mEq total) by mouth 2 (two) times daily. 90 tablet 1   senna-docusate (SENOKOT-S) 8.6-50 MG tablet Take 2 tablets by mouth daily as needed (Patient preference, Sennakot S for mild constipation).  (Patient taking differently: Take 2 tablets by mouth daily as needed for mild constipation. 2 tabs 2 x daily)     spironolactone (ALDACTONE) 25 MG tablet Take 1 tablet (25 mg total) by mouth daily. 90 tablet 3   XTAMPZA ER 13.5 MG C12A Take 1 capsule by mouth in the morning and at bedtime. 60 capsule 0   No current facility-administered medications for this visit.    REVIEW OF SYSTEMS:   Constitutional: ( - ) fevers, ( - )  chills , ( - ) night sweats Eyes: ( - ) blurriness of vision, ( - ) double vision, ( - ) watery eyes Ears, nose, mouth, throat, and face: ( - ) mucositis, ( - ) sore throat Respiratory: ( - ) cough, ( + ) dyspnea, ( - ) wheezes Cardiovascular: ( - ) palpitation, ( - ) chest discomfort, ( - ) lower extremity swelling Gastrointestinal:  ( + ) nausea, ( - ) heartburn, ( - ) change in bowel habits Skin: ( - ) abnormal skin rashes Lymphatics: ( - ) new lymphadenopathy, ( - ) easy bruising Neurological: ( - ) numbness, ( - ) tingling, ( - ) new weaknesses Behavioral/Psych: ( - ) mood change, ( - ) new changes  All other systems were reviewed with the patient and are negative.  PHYSICAL EXAMINATION: ECOG PERFORMANCE STATUS: 1 - Symptomatic but completely ambulatory  Vitals:   12/20/23 1351  BP: 115/83  Pulse: 72  Resp: 14  Temp: 98.1 F (36.7 C)  SpO2: 96%   Filed Weights   12/20/23 1351  Weight: 167 lb 6.4 oz (75.9 kg)    GENERAL: well appearing middle aged Caucasian female in NAD  SKIN: skin color, texture, turgor are normal, no rashes or significant lesions EYES: conjunctiva are pink and non-injected, sclera clear LUNGS: wheezing at lung bases. clear to auscultation and percussion with normal breathing effort HEART: regular rate & rhythm and no murmurs and bilateral trace pitting lower extremity edema Musculoskeletal: no cyanosis of digits and no clubbing PSYCH: alert & oriented  x 3, fluent speech NEURO: no focal motor/sensory deficits  LABORATORY DATA:   I have reviewed the data as listed    Latest Ref Rng & Units 12/20/2023    1:01 PM 12/12/2023    2:14 AM 12/11/2023   10:18 AM  CBC  WBC 4.0 - 10.5 K/uL 7.1  15.6  16.7   Hemoglobin 12.0 - 15.0 g/dL 82.9  56.2  13.0   Hematocrit 36.0 - 46.0 % 37.5  38.8  42.5   Platelets 150 - 400 K/uL 369  246  283        Latest Ref Rng & Units 12/20/2023    1:01 PM 12/12/2023    2:14 AM 12/11/2023   10:18 AM  CMP  Glucose 70 - 99 mg/dL 865  784  696   BUN 8 - 23 mg/dL 40  43  31   Creatinine 0.44 - 1.00 mg/dL 2.95  2.84  1.32   Sodium 135 - 145 mmol/L 143  134  137   Potassium 3.5 - 5.1 mmol/L 4.0  3.8  4.1   Chloride 98 - 111 mmol/L 108  98  99   CO2 22 - 32 mmol/L 28  26  26    Calcium 8.9 - 10.3 mg/dL 9.0  8.7  8.9   Total Protein 6.5 - 8.1 g/dL 6.5     Total Bilirubin 0.0 - 1.2 mg/dL 0.3     Alkaline Phos 38 - 126 U/L 43     AST 15 - 41 U/L 11     ALT 0 - 44 U/L 15        RADIOGRAPHIC STUDIES: I personally have viewed the radiographic studies below: MR BRAIN W WO CONTRAST Result Date: 12/20/2023 CLINICAL DATA:  CNS neoplasm, assess treatment response. History of metastatic lung cancer EXAM: MRI HEAD WITHOUT AND WITH CONTRAST TECHNIQUE: Multiplanar, multiecho pulse sequences of the brain and surrounding structures were obtained without and with intravenous contrast. CONTRAST:  7 cc of UA intravenous COMPARISON:  10/08/2023 FINDINGS: Brain: Widespread enhancing brain lesions. Dominant change since prior is multifocal T1 hyperintense expansion consistent with subacute hemorrhage, seen multifocal in the left cerebellum where there is extensive enhancing disease, the largest clot measuring up to 2.3 cm near the distorted fourth ventricle, but no hydrocephalus. The extent of enhancing disease in the posterior fossa separate from this area of intrinsic T1 shortening is not appreciably changed. Additional patchy hemorrhage with T1 shortening involving a right parietal lesion that has enlarged and shows  solid enhancement along the upper extent, lesion now measuring 2.8 cm craniocaudal. Extensive vasogenic edema in this area with mild progression. Numerous other enhancing lesions in the bilateral cerebrum are unchanged. Extensive vasogenic edema in the cerebellum but patent fourth ventricle. Mild to moderate vasogenic edema in the anterior left frontal lobe. Background nonenhancing chronic white matter disease. No acute infarct. Patient is presenting to cancer clinic today. Vascular: Major flow voids and vascular enhancements are preserved. Skull and upper cervical spine: Unremarkable left suboccipital craniotomy site. Sinuses/Orbits: Negative Prelim sent in epic chat. IMPRESSION: Multiple enhancing brain lesions from metastatic disease or treatment with multifocal subacute hemorrhage since 10/08/2023. Hemorrhage has occurred in left cerebellar disease and a right parietal lesion as described above. The largest hematoma is in the deep left cerebellum measuring 2.3 cm. There is some mass effect on the fourth ventricle but no hydrocephalus. Extensive vasogenic edema in the cerebellum and posterior right cerebral hemisphere with mild increase from prior. No new brain lesion. Electronically  Signed   By: Tiburcio Pea M.D.   On: 12/20/2023 09:12   CARDIAC CATHETERIZATION Addendum Date: 12/12/2023 1.  Successful pericardiocentesis and drain placement with 600 cc of serosanguineous fluid evacuated.  The opening pressure mean was 10 mmHg and the closing pressure was 1 mmHg with increased respiratory variation. Recommendation: Admit to 2 Heart with drain in place.  Result Date: 12/12/2023 1.  Successful pericardiocentesis and drain placement.  With 600 cc of serosanguineous fluid evacuated.  The opening pressure mean was 10 mmHg and the closing pressure was 1 mmHg with increased respiratory variation. Recommendation: Admitted to heart with drain in place.     ASSESSMENT & PLAN Virginia [name] is a 68 y.o. female  with medical history significant for metastatic adenocarcinoma of the lung presents for a follow up visit. 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   Previously we discussed Carboplatin/Pembrolizumab/Pemetrexed and the expected side effect from these medications.  We discussed the possible side effects of immunotherapy including colitis, hepatitis, dermatitis, and pneumonitis.  Additionally we discussed the side effects of carboplatin which include suppression of blood counts, nephrotoxicity, and nausea.  Additionally we discussed pemetrexed which can also have effects on counts and would require supplementation with vitamin B12 and folic acid.  She voiced her understanding of these side effects and treatment plans moving forward.  I also noted that we would be able to drop the chemotherapy agents that she had intolerance to continue with pembrolizumab alone given her high TPS score.  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 Due to worsening dizziness, she resumed this from Cycle 25-27.  --Most recent CT CAP from 02/16/2023 showed stable disease.  PLAN: --Due for Cycle 60 Day 1 of maintenance Pem/Pem today.  Holding treatment due to intracranial bleed. --Labs from today were reviewed and adequate for treatment.  Labs today show white blood cell count white blood cell 7.1, hemoglobin 12.1, MCV 98.2, platelets 369 -- Will proceed with pembrolizumab alone moving forward.  --Last CT scan in Feb 2025 showed no evidence of progressive disease.  Repeat CT scan due May 2025. --RTC q 3 weeks for labs, follow up visit prior to Cycle 60.    #Brain Metastasis, stable --appreciate the assistance of Dr. Barbaraann Cao in treating her brain metastasis and  symptoms. --radiation therapy to the brain completed on 11/16/2019.  --MRI brain on 04/29/2020 showed evidence of small brain bleed. Eliquis therapy was discontinued.  --MRI on 11/04/2020 showed concern for progression of intracranial disease.  --On 12/02/2020, patient underwent craniotomy, resection of progressive cerebellar mass by Dr. Venetia Maxon. 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  --MRI on 12/23/2020 showed evidence of radiation necrosis. Dr. Barbaraann Cao evaluated the patient on 01/02/2021 with recommendations to add IV Bevacizumab 10 mg/kg q 3 weeks ( started 01/26/2021). Completed a full course of this therapy. --Patient was evaluated by Dr. Barbaraann Cao on 08/29/2021 and recommended to resume Bevacizumab due to worsening dizziness.  Which was given from Cycle 25-27.  Plan: --continue to follow with Dr. Barbaraann Cao  --Most recent MRI from 12/18/2023 showed evidence of intracranial bleeding.  Eliquis held.  She has an upcoming visit with Dr. Barbaraann Cao on Monday morning.  #Nausea/Vomiting, worsening --Regimen includes Zofran 4-8mg  q8h PRN and compazine 10mg  q6H for breakthrough PRN, Olanzpaine 2.5  mg PO QHS to help with nausea.  She is currently using Phenergan for breakthrough. --continue to monitor   #Hypokalemia --likely 2/2 to decreased PO intake, hypovolemia, and BP medications --Potassium level 3.8 today.   --Currently on potassium chloride tablets, 40 mEq PO potassium in AM and 20mEq in PM.   #Pain Control, stable --continue oxycodone 5mg  q4H PRN. Can increase dose to 10mg  q6H if pain is severe. She is taking approximately 3-4 pills per day.  --continue Xtampza 13.5 mg ER BID for long acting support.  --continue to take with Senokot to prevent opioid induced constipation. 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 Next dose due in March 2025.    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    # Hypertension: --Continue Norvasc to 10 mg daily.  --continue to monitor at home and in clinic.    No orders of the defined types were placed in this encounter.   All questions were answered. The patient knows to call the clinic with any problems, questions or concerns.  I have spent a total of 30 minutes minutes of face-to-face and non-face-to-face time, preparing to see the patient, performing a medically appropriate examination, counseling and educating the patient,  documenting clinical information in the electronic health record, and care coordination.   Rogerio Clay, MD Department of Hematology/Oncology [facility] at [facility] Phone: [phone] Pager: [phone] Email: [email or web address]    12/20/2023 2:36 PM   Literature Support:  Almarie Jacob, Rodrguez-Abreu D, Gadgeel S, Esteban E, Felip E, De Angelis F, Domine M, Clingan P, Hochmair MJ, [place], Cheng SY, Bischoff HG, Peled N, Grossi F, Jennens RR, Reck M, [place], [place], [place], Rubio-Viqueira B, Novello S, Kurata T, Gray JE, Vida J, Wei Z, Yang J, Raftopoulos H, [place], [place] [employer]; [id number] Investigators. Pembrolizumab plus Chemotherapy in Metastatic Non-Small-Cell Lung Cancer. Mel Spine Med. 2018 May 31;378(22):2078-2092.  --Median progression-free survival was 8.8 months (95% CI, 7.6 to 9.2) in the pembrolizumab-combination group and 4.9 months (95% CI, 4.7 to 5.5) in the placebo-combination group (hazard ratio for disease progression or death, 0.52; 95% CI, 0.43 to 0.64; P<0.001). Adverse events of grade 3 or higher occurred in 67.2% of the patients in the pembrolizumab-combination group and in 65.8% of those in the placebo-combination group.  Jasper Messenger L. Efficacy of pemetrexed-based regimens in advanced non-small cell lung  cancer patients with activating epidermal growth factor receptor mutations after tyrosine kinase inhibitor failure: a systematic review. Onco Targets Ther. 2018;11:2121-2129.   --The weighted median PFS, median OS, and ORR for patients treated with pem regimens were 5.09 months, 15.91 months, and 30.19%, respectively. Our systematic review results showed a favorable efficacy profile of pem regimens in NSCLC patients with EGFR mutation after EGFR-TKI failure.

## 2023-12-22 ENCOUNTER — Encounter: Payer: Self-pay | Admitting: Hematology and Oncology

## 2023-12-23 ENCOUNTER — Inpatient Hospital Stay: Admitting: Internal Medicine

## 2023-12-23 DIAGNOSIS — C7931 Secondary malignant neoplasm of brain: Secondary | ICD-10-CM | POA: Diagnosis not present

## 2023-12-23 DIAGNOSIS — G9389 Other specified disorders of brain: Secondary | ICD-10-CM

## 2023-12-23 DIAGNOSIS — I6789 Other cerebrovascular disease: Secondary | ICD-10-CM

## 2023-12-23 DIAGNOSIS — Y842 Radiological procedure and radiotherapy as the cause of abnormal reaction of the patient, or of later complication, without mention of misadventure at the time of the procedure: Secondary | ICD-10-CM

## 2023-12-23 NOTE — Progress Notes (Signed)
 I connected with Virginia [name] on 12/23/23 at  3:00 PM EDT by telephone visit and verified that I am speaking with the correct person using two identifiers.  I discussed the limitations, risks, security and privacy concerns of performing an evaluation and management service by telemedicine and the availability of in-person appointments. I also discussed with the patient that there may be a patient responsible charge related to this service. The patient expressed understanding and agreed to proceed.   Other persons participating in the visit and their role in the encounter:  husband  Patient's location:  Home Provider's location:  Office Chief Complaint:  Malignant neoplasm metastatic to brain [facility])  Radiation therapy induced brain necrosis  History of Present Ilness: Virginia [name] reports modest improvement in recent worsening of dizziness.  She is bearing weight minimally, mostly in wheelchair currently.  No other new or progressive changes.  She did stop the eliquis after the scan resulted as instructed.    Observations: Language and cognition at baseline  Imaging:  MR BRAIN W WO CONTRAST Result Date: 12/20/2023 CLINICAL DATA:  CNS neoplasm, assess treatment response. History of metastatic lung cancer EXAM: MRI HEAD WITHOUT AND WITH CONTRAST TECHNIQUE: Multiplanar, multiecho pulse sequences of the brain and surrounding structures were obtained without and with intravenous contrast. CONTRAST:  7 cc of UA intravenous COMPARISON:  10/08/2023 FINDINGS: Brain: Widespread enhancing brain lesions. Dominant change since prior is multifocal T1 hyperintense expansion consistent with subacute hemorrhage, seen multifocal in the left cerebellum where there is extensive enhancing disease, the largest clot measuring up to 2.3 cm near the distorted fourth ventricle, but no hydrocephalus. The extent of enhancing disease in the posterior fossa separate from this area of intrinsic T1 shortening is not  appreciably changed. Additional patchy hemorrhage with T1 shortening involving a right parietal lesion that has enlarged and shows solid enhancement along the upper extent, lesion now measuring 2.8 cm craniocaudal. Extensive vasogenic edema in this area with mild progression. Numerous other enhancing lesions in the bilateral cerebrum are unchanged. Extensive vasogenic edema in the cerebellum but patent fourth ventricle. Mild to moderate vasogenic edema in the anterior left frontal lobe. Background nonenhancing chronic white matter disease. No acute infarct. Patient is presenting to cancer clinic today. Vascular: Major flow voids and vascular enhancements are preserved. Skull and upper cervical spine: Unremarkable left suboccipital craniotomy site. Sinuses/Orbits: Negative Prelim sent in epic chat. IMPRESSION: Multiple enhancing brain lesions from metastatic disease or treatment with multifocal subacute hemorrhage since 10/08/2023. Hemorrhage has occurred in left cerebellar disease and a right parietal lesion as described above. The largest hematoma is in the deep left cerebellum measuring 2.3 cm. There is some mass effect on the fourth ventricle but no hydrocephalus. Extensive vasogenic edema in the cerebellum and posterior right cerebral hemisphere with mild increase from prior. No new brain lesion. Electronically Signed   By: Ronnette Coke M.D.   On: 12/20/2023 09:12   CARDIAC CATHETERIZATION Addendum Date: 12/12/2023 1.  Successful pericardiocentesis and drain placement with 600 cc of serosanguineous fluid evacuated.  The opening pressure mean was 10 mmHg and the closing pressure was 1 mmHg with increased respiratory variation. Recommendation: Admit to 2 Heart with drain in place.  Result Date: 12/12/2023 1.  Successful pericardiocentesis and drain placement.  With 600 cc of serosanguineous fluid evacuated.  The opening pressure mean was 10 mmHg and the closing pressure was 1 mmHg with increased respiratory  variation. Recommendation: Admitted to heart with drain in place.   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    This is her second hemorrhage (prior was in 2021) while on anticoagulation.  Her DVT/PE was in December 2024.    She remains at high risk of hemorrhage within posterior fossa, which can lead to coma or death.  Recommended continuing to with-hold anti-coagulation indefinitely.  She may be a good candidate for IVC filter; will discuss with Dr. Rosaline Coma.  Otherwise, we would recommend re-imaging with brain MRI in 3 months, or sooner again if new symptoms develop.   Follow Up Instructions: We ask that Virginia [name] return to clinic in 3 months following next brain MRI, or sooner as needed.  I discussed the assessment and treatment plan with the patient.  The patient was provided an opportunity to ask questions and all were answered.  The patient agreed with the plan and demonstrated understanding of the instructions.    The patient was advised to call back or seek an in-person evaluation if the symptoms worsen or if the condition fails to improve as anticipated.    Ayrabella Labombard K Claudeen Leason, MD   I provided 20 minutes of non face-to-face telephone visit time during this encounter, and > 50% was spent counseling as documented under my assessment & plan.

## 2023-12-25 ENCOUNTER — Telehealth: Payer: Self-pay | Admitting: Internal Medicine

## 2023-12-25 ENCOUNTER — Other Ambulatory Visit: Payer: Self-pay | Admitting: Nurse Practitioner

## 2023-12-25 DIAGNOSIS — Z515 Encounter for palliative care: Secondary | ICD-10-CM

## 2023-12-25 DIAGNOSIS — G893 Neoplasm related pain (acute) (chronic): Secondary | ICD-10-CM

## 2023-12-25 DIAGNOSIS — C7951 Secondary malignant neoplasm of bone: Secondary | ICD-10-CM

## 2023-12-25 MED ORDER — XTAMPZA ER 13.5 MG PO C12A: 1.0000 | EXTENDED_RELEASE_CAPSULE | Freq: Two times a day (BID) | ORAL | 0 refills | Status: DC

## 2023-12-25 NOTE — Telephone Encounter (Signed)
 Patient scheduled appointments. Patient is aware of all appointment details.

## 2023-12-26 ENCOUNTER — Ambulatory Visit: Admitting: Internal Medicine

## 2023-12-27 ENCOUNTER — Ambulatory Visit: Admitting: Cardiology

## 2024-01-01 ENCOUNTER — Other Ambulatory Visit: Payer: Self-pay | Admitting: Hematology and Oncology

## 2024-01-03 ENCOUNTER — Telehealth: Payer: Self-pay | Admitting: *Deleted

## 2024-01-03 DIAGNOSIS — C7931 Secondary malignant neoplasm of brain: Secondary | ICD-10-CM

## 2024-01-03 DIAGNOSIS — C7951 Secondary malignant neoplasm of bone: Secondary | ICD-10-CM

## 2024-01-03 DIAGNOSIS — C3490 Malignant neoplasm of unspecified part of unspecified bronchus or lung: Secondary | ICD-10-CM

## 2024-01-03 NOTE — Telephone Encounter (Signed)
 Copied from CRM [id number]. Topic: Referral - Question >> Dec 24, 2023  4:58 PM Juleen Oakland F wrote: Reason for CRM: Patient spouse Mont Antis called to request for a new order to be put in to [facility] for patient to bath 2 times weekly- a new order is needed after each dicharge

## 2024-01-07 ENCOUNTER — Other Ambulatory Visit: Payer: Self-pay | Admitting: Nurse Practitioner

## 2024-01-07 ENCOUNTER — Telehealth: Payer: Self-pay | Admitting: Internal Medicine

## 2024-01-07 DIAGNOSIS — Z515 Encounter for palliative care: Secondary | ICD-10-CM

## 2024-01-07 DIAGNOSIS — G893 Neoplasm related pain (acute) (chronic): Secondary | ICD-10-CM

## 2024-01-07 DIAGNOSIS — C7951 Secondary malignant neoplasm of bone: Secondary | ICD-10-CM

## 2024-01-07 MED ORDER — OXYCODONE HCL 5 MG PO TABS: 5.0000 mg | ORAL_TABLET | ORAL | 0 refills | Status: DC | PRN

## 2024-01-07 NOTE — Telephone Encounter (Signed)
  Patient is having a hard time getting around and would like to know if her appointment on Monday could be virtual. Please advise.

## 2024-01-07 NOTE — Telephone Encounter (Signed)
 Ok to switch to virtual per Renelda Carry.   Virginia [name] the patient and she voiced understanding.

## 2024-01-07 NOTE — Telephone Encounter (Signed)
 Not sure if home health does this.  Left message on machine for pt husband to call back to double check to see if home health said they will do this for pt.

## 2024-01-08 ENCOUNTER — Telehealth: Payer: Self-pay | Admitting: *Deleted

## 2024-01-08 NOTE — Telephone Encounter (Signed)
 Pt will need face to face visit for notes.  Can we please call to get her scheduled.  I think back in January they did a video visit.

## 2024-01-08 NOTE — Telephone Encounter (Signed)
 Spoke with husband and he stated that [facility] has done this before and they have to renew.    Order has been placed.

## 2024-01-08 NOTE — Telephone Encounter (Signed)
 Received call from pt's husband, Virginia [name]. He is asking if Virginia [name] would getting an IVC fliter because of blood clots and that she cannot take blood thinners any more due to a recent brain bleed.  Advised that Dr. Rosaline Coma does not recommend the filter as it can cause clots to accumulate over time. It is not intended as a long term solution in her situation. Virginia [name] voiced understanding and had wondered if the accumulation of clots could happen. He did have questions regarding her treatment plan for Friday and was advised that Dr. Rosaline Coma will discuss her treatment with them on Friday at their scheduled appt. Virginia [name] voiced understanding.

## 2024-01-09 DIAGNOSIS — C3490 Malignant neoplasm of unspecified part of unspecified bronchus or lung: Secondary | ICD-10-CM

## 2024-01-09 DIAGNOSIS — E1122 Type 2 diabetes mellitus with diabetic chronic kidney disease: Secondary | ICD-10-CM | POA: Diagnosis not present

## 2024-01-09 DIAGNOSIS — I5022 Chronic systolic (congestive) heart failure: Secondary | ICD-10-CM | POA: Diagnosis not present

## 2024-01-09 DIAGNOSIS — C7951 Secondary malignant neoplasm of bone: Secondary | ICD-10-CM

## 2024-01-09 DIAGNOSIS — C7931 Secondary malignant neoplasm of brain: Secondary | ICD-10-CM

## 2024-01-09 DIAGNOSIS — I130 Hypertensive heart and chronic kidney disease with heart failure and stage 1 through stage 4 chronic kidney disease, or unspecified chronic kidney disease: Secondary | ICD-10-CM | POA: Diagnosis not present

## 2024-01-09 DIAGNOSIS — D630 Anemia in neoplastic disease: Secondary | ICD-10-CM

## 2024-01-09 DIAGNOSIS — I2699 Other pulmonary embolism without acute cor pulmonale: Secondary | ICD-10-CM

## 2024-01-09 DIAGNOSIS — J9601 Acute respiratory failure with hypoxia: Secondary | ICD-10-CM

## 2024-01-09 DIAGNOSIS — N1832 Chronic kidney disease, stage 3b: Secondary | ICD-10-CM | POA: Diagnosis not present

## 2024-01-09 DIAGNOSIS — I700 Atherosclerosis of aorta: Secondary | ICD-10-CM

## 2024-01-09 DIAGNOSIS — G893 Neoplasm related pain (acute) (chronic): Secondary | ICD-10-CM

## 2024-01-10 ENCOUNTER — Inpatient Hospital Stay (HOSPITAL_BASED_OUTPATIENT_CLINIC_OR_DEPARTMENT_OTHER): Admitting: Nurse Practitioner

## 2024-01-10 ENCOUNTER — Inpatient Hospital Stay

## 2024-01-10 ENCOUNTER — Inpatient Hospital Stay: Attending: Hematology and Oncology

## 2024-01-10 ENCOUNTER — Encounter: Payer: Self-pay | Admitting: Nurse Practitioner

## 2024-01-10 ENCOUNTER — Inpatient Hospital Stay (HOSPITAL_BASED_OUTPATIENT_CLINIC_OR_DEPARTMENT_OTHER): Admitting: Physician Assistant

## 2024-01-10 VITALS — BP 134/95 | HR 81 | Temp 97.6°F | Resp 17 | Wt 151.2 lb

## 2024-01-10 VITALS — BP 133/91

## 2024-01-10 DIAGNOSIS — I700 Atherosclerosis of aorta: Secondary | ICD-10-CM | POA: Insufficient documentation

## 2024-01-10 DIAGNOSIS — Z87891 Personal history of nicotine dependence: Secondary | ICD-10-CM | POA: Insufficient documentation

## 2024-01-10 DIAGNOSIS — Z7901 Long term (current) use of anticoagulants: Secondary | ICD-10-CM | POA: Diagnosis not present

## 2024-01-10 DIAGNOSIS — Z8673 Personal history of transient ischemic attack (TIA), and cerebral infarction without residual deficits: Secondary | ICD-10-CM | POA: Insufficient documentation

## 2024-01-10 DIAGNOSIS — I3139 Other pericardial effusion (noninflammatory): Secondary | ICD-10-CM | POA: Diagnosis not present

## 2024-01-10 DIAGNOSIS — R112 Nausea with vomiting, unspecified: Secondary | ICD-10-CM | POA: Insufficient documentation

## 2024-01-10 DIAGNOSIS — C3490 Malignant neoplasm of unspecified part of unspecified bronchus or lung: Secondary | ICD-10-CM

## 2024-01-10 DIAGNOSIS — Z86718 Personal history of other venous thrombosis and embolism: Secondary | ICD-10-CM | POA: Insufficient documentation

## 2024-01-10 DIAGNOSIS — K5900 Constipation, unspecified: Secondary | ICD-10-CM | POA: Insufficient documentation

## 2024-01-10 DIAGNOSIS — C7951 Secondary malignant neoplasm of bone: Secondary | ICD-10-CM

## 2024-01-10 DIAGNOSIS — K5903 Drug induced constipation: Secondary | ICD-10-CM | POA: Diagnosis not present

## 2024-01-10 DIAGNOSIS — G893 Neoplasm related pain (acute) (chronic): Secondary | ICD-10-CM

## 2024-01-10 DIAGNOSIS — R918 Other nonspecific abnormal finding of lung field: Secondary | ICD-10-CM

## 2024-01-10 DIAGNOSIS — Z5112 Encounter for antineoplastic immunotherapy: Secondary | ICD-10-CM

## 2024-01-10 DIAGNOSIS — Z9049 Acquired absence of other specified parts of digestive tract: Secondary | ICD-10-CM | POA: Insufficient documentation

## 2024-01-10 DIAGNOSIS — C7931 Secondary malignant neoplasm of brain: Secondary | ICD-10-CM

## 2024-01-10 DIAGNOSIS — Z90710 Acquired absence of both cervix and uterus: Secondary | ICD-10-CM | POA: Diagnosis not present

## 2024-01-10 DIAGNOSIS — I119 Hypertensive heart disease without heart failure: Secondary | ICD-10-CM | POA: Insufficient documentation

## 2024-01-10 DIAGNOSIS — Z79899 Other long term (current) drug therapy: Secondary | ICD-10-CM | POA: Insufficient documentation

## 2024-01-10 DIAGNOSIS — C3431 Malignant neoplasm of lower lobe, right bronchus or lung: Secondary | ICD-10-CM | POA: Insufficient documentation

## 2024-01-10 DIAGNOSIS — E876 Hypokalemia: Secondary | ICD-10-CM | POA: Insufficient documentation

## 2024-01-10 DIAGNOSIS — I2510 Atherosclerotic heart disease of native coronary artery without angina pectoris: Secondary | ICD-10-CM | POA: Diagnosis not present

## 2024-01-10 DIAGNOSIS — Z515 Encounter for palliative care: Secondary | ICD-10-CM

## 2024-01-10 DIAGNOSIS — R42 Dizziness and giddiness: Secondary | ICD-10-CM | POA: Diagnosis not present

## 2024-01-10 LAB — CMP (CANCER CENTER ONLY)
ALT: 17 U/L (ref 0–44)
AST: 13 U/L — ABNORMAL LOW (ref 15–41)
Albumin: 3.4 g/dL — ABNORMAL LOW (ref 3.5–5.0)
Alkaline Phosphatase: 37 U/L — ABNORMAL LOW (ref 38–126)
Anion gap: 4 — ABNORMAL LOW (ref 5–15)
BUN: 37 mg/dL — ABNORMAL HIGH (ref 8–23)
CO2: 30 mmol/L (ref 22–32)
Calcium: 9.3 mg/dL (ref 8.9–10.3)
Chloride: 106 mmol/L (ref 98–111)
Creatinine: 0.97 mg/dL (ref 0.44–1.00)
GFR, Estimated: >60 mL/min (ref >60)
Glucose, Bld: 75 mg/dL (ref 70–99)
Potassium: 4.9 mmol/L (ref 3.5–5.1)
Sodium: 140 mmol/L (ref 135–145)
Total Bilirubin: 0.3 mg/dL (ref 0.0–1.2)
Total Protein: 6 g/dL — ABNORMAL LOW (ref 6.5–8.1)

## 2024-01-10 LAB — CBC WITH DIFFERENTIAL (CANCER CENTER ONLY)
Abs Immature Granulocytes: 0.17 10*3/uL — ABNORMAL HIGH (ref 0.00–0.07)
Basophils Absolute: 0.1 10*3/uL (ref 0.0–0.1)
Basophils Relative: 1 %
Eosinophils Absolute: 0 10*3/uL (ref 0.0–0.5)
Eosinophils Relative: 0 %
HCT: 37 % (ref 36.0–46.0)
Hemoglobin: 12.4 g/dL (ref 12.0–15.0)
Immature Granulocytes: 2 %
Lymphocytes Relative: 26 %
Lymphs Abs: 2.4 10*3/uL (ref 0.7–4.0)
MCH: 31.7 pg (ref 26.0–34.0)
MCHC: 33.5 g/dL (ref 30.0–36.0)
MCV: 94.6 fL (ref 80.0–100.0)
Monocytes Absolute: 0.9 10*3/uL (ref 0.1–1.0)
Monocytes Relative: 10 %
Neutro Abs: 5.5 10*3/uL (ref 1.7–7.7)
Neutrophils Relative %: 61 %
Platelet Count: 194 10*3/uL (ref 150–400)
RBC: 3.91 MIL/uL (ref 3.87–5.11)
RDW: 12.8 % (ref 11.5–15.5)
WBC Count: 9 10*3/uL (ref 4.0–10.5)
nRBC: 0 % (ref 0.0–0.2)

## 2024-01-10 LAB — TSH: TSH: 0.586 u[IU]/mL (ref 0.350–4.500)

## 2024-01-10 MED ORDER — SODIUM CHLORIDE 0.9 % IV SOLN
500.0000 mg/m2 | Freq: Once | INTRAVENOUS | Status: DC
Start: 2024-01-10 — End: 2024-01-10

## 2024-01-10 MED ORDER — PROCHLORPERAZINE MALEATE 10 MG PO TABS: 10.0000 mg | ORAL_TABLET | Freq: Once | ORAL | Status: DC

## 2024-01-10 MED ORDER — SODIUM CHLORIDE 0.9 % IV SOLN: Freq: Once | INTRAVENOUS | Status: AC

## 2024-01-10 MED ORDER — SODIUM CHLORIDE 0.9 % IV SOLN
200.0000 mg | Freq: Once | INTRAVENOUS | Status: AC
  Administered 2024-01-10: 200 mg via INTRAVENOUS
  Filled 2024-01-10: qty 200

## 2024-01-10 NOTE — Progress Notes (Signed)
 Palliative Medicine [facility]  Telephone:([phone]) [phone] Fax:(336) [phone]   Name: Virginia [name] Date: 01/10/2024 MRN: [id number]  DOB: 12-23-1955  Patient Care Team: Sylvan Evener, MD as PCP - General (Internal Medicine) Ander Bame, MD as PCP - Hematology/Oncology (Hematology and Oncology) Jaye Mettle, RN as Oncology Nurse Navigator Rafe Bunde, DO as Consulting Physician [facility] and Palliative Medicine) Lbcardiology, Zola Hint, MD as Rounding Team (Cardiology) Pickenpack-Cousar, Giles Labrum, NP as Nurse Practitioner (Hospice and Palliative Medicine)    INTERVAL HISTORY: Virginia [name] is a 68 y.o. female with oncologic medical history including non-small cell lung cancer (09/2019) with metastatic disease to the bone and brain. Palliative ask to see for symptom management and goals of care.   SOCIAL HISTORY:     reports that she quit smoking about 11 years ago. Her smoking use included cigarettes. She has never used smokeless tobacco. She reports current alcohol use. She reports that she does not use drugs.  ADVANCE DIRECTIVES:  Advanced directives on file naming Virginia [name] and Virginia [name] as the primary and secondary decision makers should the patient become unable to speak for themselves.   CODE STATUS: Full code  PAST MEDICAL HISTORY: Past Medical History:  Diagnosis Date   Anemia    Anxiety    Concussion 09/28/2019   DVT (deep venous thrombosis) (HCC) 2021   L leg   Dyspnea    GERD (gastroesophageal reflux disease)    Hypercholesterolemia    per pt, she does not have elevated lipids   Hypertension    met lung ca dx'd 09/2019   mets to spine, hip and brain   PONV (postoperative nausea and vomiting)    Tobacco abuse     ALLERGIES:  is allergic to scopolamine .  MEDICATIONS:  Current Outpatient Medications  Medication Sig Dispense Refill   acetaminophen  (TYLENOL ) 325 MG tablet Take 1-2 tablets (325-650 mg total)  by mouth every 4 (four) hours as needed for mild pain (pain score 1-3). (Patient taking differently: Take 500 mg by mouth every 4 (four) hours as needed for mild pain (pain score 1-3).)     albuterol  (VENTOLIN  HFA) 108 (90 Base) MCG/ACT inhaler INHALE 2 PUFFS INTO THE LUNGS EVERY 6 HOURS AS NEEDED FOR WHEEZING OR SHORTNESS OF BREATH 6.7 g 11   ALPRAZolam  (XANAX ) 0.25 MG tablet Take 1 tablet (0.25 mg total) by mouth 2 (two) times daily as needed for anxiety. 30 tablet 0   Cholecalciferol  (VITAMIN D3) 125 MCG (5000 UT) TABS Take 5,000 Units by mouth daily.     colchicine  0.6 MG tablet Take 1 tablet (0.6 mg total) by mouth daily. 90 tablet 0   Cyanocobalamin  (VITAMIN B12 PO) Take 5,000 mcg by mouth daily. Gummies     dexamethasone  (DECADRON ) 1 MG tablet Take 1 tablet (1 mg total) by mouth daily. (Patient taking differently: Take 1 mg by mouth See admin instructions. Every 30 hours) 90 tablet 0   hydrocortisone  (ANUSOL -HC) 2.5 % rectal cream use in rectal area 2 to 3 times a day as needed 30 g PRN   levETIRAcetam  (KEPPRA ) 1000 MG tablet Take 1 tablet (1,000 mg total) by mouth 2 (two) times daily. 180 tablet 2   magnesium  oxide (MAG-OX) 400 (240 Mg) MG tablet Take 1 tablet (400 mg total) by mouth 2 (two) times daily. 60 tablet 0   meclizine  (ANTIVERT ) 50 MG tablet Take 1 tablet (50 mg total) by mouth 3 (three) times daily. (Patient  taking differently: Take 100 mg by mouth 3 (three) times daily.) 90 tablet 2   metoprolol  succinate (TOPROL -XL) 25 MG 24 hr tablet Take 1 tablet (25 mg total) by mouth daily. 90 tablet 3   Multiple Vitamin (MULTIVITAMIN WITH MINERALS) TABS tablet Take 2 tablets by mouth daily. Gummy     Nutritional Supplements (,FEEDING SUPPLEMENT, PROSOURCE PLUS) liquid Take 30 mLs by mouth 2 (two) times daily between meals. (Patient taking differently: Take 30 mLs by mouth 4 (four) times daily. Berry flavor)     OLANZapine  (ZYPREXA ) 2.5 MG tablet Take 1 tablet (2.5 mg total) by mouth at  bedtime. 90 tablet 1   ondansetron  (ZOFRAN ) 4 MG tablet TAKE 1 TABLET(4 MG) BY MOUTH EVERY 8 HOURS AS NEEDED FOR NAUSEA OR VOMITING 40 tablet 2   oxyCODONE  (OXY IR/ROXICODONE ) 5 MG immediate release tablet Take 1 tablet (5 mg total) by mouth every 4 (four) hours as needed for severe pain (pain score 7-10). 90 tablet 0   pantoprazole  (PROTONIX ) 40 MG tablet TAKE 1 TABLET(40 MG) BY MOUTH DAILY 90 tablet 3   polyethylene glycol (MIRALAX  / GLYCOLAX ) 17 g packet Take 17 g by mouth daily as needed for moderate constipation or mild constipation.     potassium chloride  SA (KLOR-CON  M) 20 MEQ tablet TAKE 1 TABLET(20 MEQ) BY MOUTH TWICE DAILY 90 tablet 1   senna-docusate (SENOKOT-S) 8.6-50 MG tablet Take 2 tablets by mouth daily as needed (Patient preference, Sennakot S for mild constipation). (Patient taking differently: Take 2 tablets by mouth daily as needed for mild constipation. 2 tabs 2 x daily)     spironolactone  (ALDACTONE ) 25 MG tablet Take 1 tablet (25 mg total) by mouth daily. 90 tablet 3   XTAMPZA  ER 13.5 MG C12A Take 1 capsule by mouth in the morning and at bedtime. 60 capsule 0   No current facility-administered medications for this visit.    VITAL SIGNS: BP (!) 134/95 (BP Location: Left Arm, Patient Position: Sitting)   Pulse 81   Temp 97.6 F (36.4 C) (Temporal)   Resp 17   Wt 151 lb 3.2 oz (68.6 kg)   SpO2 98%   BMI 27.65 kg/m  Filed Weights   01/10/24 1248  Weight: 151 lb 3.2 oz (68.6 kg)    Estimated body mass index is 27.65 kg/m as calculated from the following:   Height as of 12/10/23: 5\' 2"  (1.575 m).   Weight as of this encounter: 151 lb 3.2 oz (68.6 kg).   PERFORMANCE STATUS (ECOG) : 1 - Symptomatic but completely ambulatory  Physical Exam General: NAD Cardiovascular: regular rate and rhythm Pulmonary: normal breathing pattern Extremities: no edema, no joint deformities Skin: no rashes Neurological: AAO x3  IMPRESSION: Discussed the use of AI scribe software  for clinical note transcription with the patient, who gave verbal consent to proceed.  History of Present Illness Virginia [name] is a 68 year old female who presents for management of cancer related pain. No acute distress. Accompanied by her husband. Denies concerns for nausea or vomiting. She has a history of constipation, managed with Senokot twice daily and Miralax  as needed, which is most days. Skipping doses leads to difficulty with bowel movements. No issues with diarrhea, nausea, or vomiting. Her weight has increased from 172 pounds to 181 pounds over the past three weeks. No issues with sleep or appetite.  Patient reports pain is well controlled on current regimen. She is taking Xtampza  every 12 hours and Oxycodone  every 4-6 hours as needed  for breakthrough pain is effective. She is using less breakthrough pain medication now, with usage varying depending on her needs. Some days she only requires breakthrough medication once. No adjustments made to regimen at this time.   All questions answered and support provided.   I discussed the importance of continued conversation with family and their medical providers regarding overall plan of care and treatment options, ensuring decisions are within the context of the patients values and GOCs.  Assessment & Plan  Pain management Chronic pain managed with Xtampza  extended release and breakthrough medication. Pain levels are 6-7 out of 10 during visit. She reports requiring breakthrough medication use 1-5 times daily depending on level of activities for the day. Current regimen is manageable, though pain levels have recently increased. - Continue Xtampza  13.5mg  extended release every 12 hours. -Continue Oxycodone  every 4-6 hours as needed for breakthrough pain. - Monitor pain levels for consistency and severity. - Discuss potential adjustments to pain regimen if pain becomes more consistent and severe.  Constipation Constipation managed with  Senokot twice daily and Miralax  as needed. Regular use is necessary to prevent difficulty with bowel movements. - Continue Senokot twice daily. - Use Miralax  as needed for constipation.  I will plan to follow-up with patient in 4-6 weeks. Sooner if needed.     Patient expressed understanding and was in agreement with this plan. She also understands that She can call the clinic at any time with any questions, concerns, or complaints.   Any controlled substances utilized were prescribed in the context of palliative care. PDMP has been reviewed.   Visit consisted of counseling and education dealing with the complex and emotionally intense issues of symptom management and palliative care in the setting of serious and potentially life-threatening illness.  Dellia Ferguson, AGPCNP-BC  Palliative Medicine Team/[facility]

## 2024-01-12 NOTE — Progress Notes (Unsigned)
 Virtual Visit via Telephone Note   Because of Virginia [name] co-morbid illnesses, she is at least at moderate risk for complications without adequate follow up.  This format is felt to be most appropriate for this patient at this time.  Due to technical limitations with video connection (technology), today's appointment will be conducted as an audio only telehealth visit, and LEASHIA POLCYN verbally agreed to proceed in this manner.   All issues noted in this document were discussed and addressed.  No physical exam could be performed with this format.  Evaluation Performed:  Preoperative cardiovascular risk assessment _____________   Date:  01/12/2024   Patient ID:  Virginia [name], DOB 02/20/1956, MRN [id number] Patient Location:  Home Provider location:   Office  Primary Care Provider:  Sylvan Evener, MD Primary Cardiologist:  None  Chief Complaint / Patient Profile   Virginia [name] is a 68 y.o. 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  History of Present Illness    Ms. Neuser was seen in 2013 by Dr. [place] with complaint of chest pain and underwent a stress Myoview  that was normal.  She suffered an LOH VF arrest while eating breakfast on 06/07/2019 requiring CPR x 20 minutes by her husband.  She developed aspiration pneumonitis and was admitted from 06/10/2023 to 07/02/2023.  2D echo was completed showing decreased EF of 45-50% with global hypokinesis.  She was not a candidate for invasive intervention and was started on amiodarone .  She was admitted to inpatient rehab on 06/10/2023 underwent PT/OT x 3 hours for 5 days a week.  She was started on spironolactone  for edema and home Norvasc  was held due to swelling.  She was discharged  on metoprolol  and taken off of amiodarone  prior to leaving hospital.  She was seen in follow-up by Friddie Jetty, NP on 07/16/2023 and was wheelchair-bound with left-sided weakness.  During visit she denied any chest pain or shortness of breath.  She endorsed some low back pain and sternal pain from CPR.  She was seen in the ED 08/26/2023 with complaint of chest pain and was found to have pulmonary embolism completing CTA of the chest.  She underwent bilateral lower extremity Dopplers that showed no evidence of DVT.  She was started on IV heparin  for 48 hours then transition to Eliquis  and patient were okay with continuing anticoagulation after discharge.  She was seen by Dr. Lorie Rook on 10/24/2023 and patient reported doing well from a cardiovascular standpoint with more peripheral edema noted.  She was still undergoing immunotherapy per hematology and oncology.  She was started on Lasix  20 mg daily and completed an updated 2D echo that showed EF of 50-55% with no RWMA and grade 1 DD with large pericardial effusion.  She was seen in follow-up on 12/06/2023 and noted feeling weak and dizzy with increased shortness of breath.Pembrolizumab  has been associated with pericardial effusions.  She was scheduled for outpatient pericardiocentesis that was completed on 12/10/2023 with removal of 600 cc of serosanguineous fluid.  She was admitted for evaluation postprocedure with drain in place.  A bedside echo that showed mild pericardial effusion with presence of epicardial thrombus was very small.  She was started on empirical colchicine  presents today  for follow-up.  She contacted our office and reported difficulty with getting around and requested a virtual follow-up.  He completed an MRI of the brain that showed hematoma in the left cerebrum measuring 2.3 cm and Eliquis  was held per oncology.  She was seen by her neuro oncologist Dr. Mark Sil who recommended that she continue to hold [facility].  I   Past Medical History     Past Medical History:  Diagnosis Date   Anemia    Anxiety    Concussion 09/28/2019   DVT (deep venous thrombosis) (HCC) 2021   L leg   Dyspnea    GERD (gastroesophageal reflux disease)    Hypercholesterolemia    per pt, she does not have elevated lipids   Hypertension    met lung ca dx'd 09/2019   mets to spine, hip and brain   PONV (postoperative nausea and vomiting)    Tobacco abuse    Past Surgical History:  Procedure Laterality Date   ABDOMINAL HYSTERECTOMY     partial/ left ovaries   APPLICATION OF CRANIAL NAVIGATION N/A 12/02/2020   Procedure: APPLICATION OF CRANIAL NAVIGATION;  Surgeon: Manya Sells, MD;  Location: [facility] OR;  Service: Neurosurgery;  Laterality: N/A;   CHOLECYSTECTOMY     CRANIOTOMY N/A 12/02/2020   Procedure: Posterior fossa craniotomy for tumor resection with brainlab;  Surgeon: Manya Sells, MD;  Location: [facility] OR;  Service: Neurosurgery;  Laterality: N/A;   DILATION AND CURETTAGE OF UTERUS     IR IMAGING GUIDED PORT INSERTION  10/23/2019   IR PATIENT EVAL TECH 0-60 MINS  11/21/2021   IR PATIENT EVAL TECH 0-60 MINS  11/24/2021   IR PATIENT EVAL TECH 0-60 MINS  11/29/2021   IR PATIENT EVAL TECH 0-60 MINS  12/04/2021   IR PATIENT EVAL TECH 0-60 MINS  12/12/2021   IR PATIENT EVAL TECH 0-60 MINS  12/19/2021   IR PATIENT EVAL TECH 0-60 MINS  12/27/2021   IR PATIENT EVAL TECH 0-60 MINS  01/03/2022   IR PATIENT EVAL TECH 0-60 MINS  01/10/2022   IR PATIENT EVAL TECH 0-60 MINS  01/18/2022   IR PATIENT EVAL TECH 0-60 MINS  02/13/2022   IR PATIENT EVAL TECH 0-60 MINS  02/20/2022   IR RADIOLOGIST EVAL & MGMT  01/23/2022   IR REMOVAL TUN ACCESS W/ PORT W/O FL MOD SED  11/17/2021   KYPHOPLASTY N/A 03/15/2020   Procedure: Thoracic Eight KYPHOPLASTY;  Surgeon: Manya Sells, MD;  Location: [facility] OR;  Service: Neurosurgery;  Laterality: N/A;  prone    LIPOMA EXCISION  2018   removed under left breast and right thigh.   PERICARDIOCENTESIS N/A 12/10/2023   Procedure: PERICARDIOCENTESIS;   Surgeon: Kyra Phy, MD;  Location: [facility] INVASIVE CV LAB;  Service: Cardiovascular;  Laterality: N/A;   TUBAL LIGATION      Allergies  Allergies  Allergen Reactions   Scopolamine  Rash    Home Medications    Prior to Admission medications   Medication Sig Start Date End Date Taking? Authorizing Provider  acetaminophen  (TYLENOL ) 325 MG tablet Take 1-2 tablets (325-650 mg total) by mouth every 4 (four) hours as needed for mild pain (pain score 1-3). Patient taking differently: Take 500 mg by mouth every 4 (four) hours as needed for mild pain (pain score 1-3). 07/01/23   Setzer, Sandra J, [name]  albuterol  (VENTOLIN  HFA) 108 (90 Base) MCG/ACT inhaler INHALE 2 PUFFS INTO THE LUNGS EVERY 6 HOURS AS NEEDED FOR WHEEZING OR SHORTNESS OF BREATH 12/09/23  Sylvan Evener, MD  ALPRAZolam  (XANAX ) 0.25 MG tablet Take 1 tablet (0.25 mg total) by mouth 2 (two) times daily as needed for anxiety. 12/07/23   Pickenpack-Cousar, Athena N, NP  Cholecalciferol  (VITAMIN D3) 125 MCG (5000 UT) TABS Take 5,000 Units by mouth daily.    [provider]  colchicine  0.6 MG tablet Take 1 tablet (0.6 mg total) by mouth daily. 12/12/23   Knox Perl, MD  Cyanocobalamin  (VITAMIN B12 PO) Take 5,000 mcg by mouth daily. Gummies    [provider]  dexamethasone  (DECADRON ) 1 MG tablet Take 1 tablet (1 mg total) by mouth daily. Patient taking differently: Take 1 mg by mouth See admin instructions. Every 30 hours 07/29/23   Vaslow, Zachary K, MD  hydrocortisone  (ANUSOL -HC) 2.5 % rectal cream use in rectal area 2 to 3 times a day as needed 05/24/23   Sylvan Evener, MD  levETIRAcetam  (KEPPRA ) 1000 MG tablet Take 1 tablet (1,000 mg total) by mouth 2 (two) times daily. 12/09/23   Vaslow, Zachary K, MD  magnesium  oxide (MAG-OX) 400 (240 Mg) MG tablet Take 1 tablet (400 mg total) by mouth 2 (two) times daily. 07/01/23   Setzer, Sandra J, [name]  meclizine  (ANTIVERT ) 50 MG tablet Take 1 tablet (50 mg total) by mouth 3  (three) times daily. Patient taking differently: Take 100 mg by mouth 3 (three) times daily. 07/29/23   Vaslow, Zachary K, MD  metoprolol  succinate (TOPROL -XL) 25 MG 24 hr tablet Take 1 tablet (25 mg total) by mouth daily. 07/26/23   Tania Familia, NP  Multiple Vitamin (MULTIVITAMIN WITH MINERALS) TABS tablet Take 2 tablets by mouth daily. Gummy    [provider]  Nutritional Supplements (,FEEDING SUPPLEMENT, PROSOURCE PLUS) liquid Take 30 mLs by mouth 2 (two) times daily between meals. Patient taking differently: Take 30 mLs by mouth 4 (four) times daily. Berry flavor 07/01/23   Setzer, Hamp Levine, [name]  OLANZapine  (ZYPREXA ) 2.5 MG tablet Take 1 tablet (2.5 mg total) by mouth at bedtime. 07/29/23   Ander Bame, MD  ondansetron  (ZOFRAN ) 4 MG tablet TAKE 1 TABLET(4 MG) BY MOUTH EVERY 8 HOURS AS NEEDED FOR NAUSEA OR VOMITING 01/30/23   Dorsey, John T IV, MD  oxyCODONE  (OXY IR/ROXICODONE ) 5 MG immediate release tablet Take 1 tablet (5 mg total) by mouth every 4 (four) hours as needed for severe pain (pain score 7-10). 01/07/24   Pickenpack-Cousar, Giles Labrum, NP  pantoprazole  (PROTONIX ) 40 MG tablet TAKE 1 TABLET(40 MG) BY MOUTH DAILY 09/12/23   Sylvan Evener, MD  polyethylene glycol (MIRALAX  / GLYCOLAX ) 17 g packet Take 17 g by mouth daily as needed for moderate constipation or mild constipation.    [provider]  potassium chloride  SA (KLOR-CON  M) 20 MEQ tablet TAKE 1 TABLET(20 MEQ) BY MOUTH TWICE DAILY 01/02/24   Dorsey, John T IV, MD  senna-docusate (SENOKOT-S) 8.6-50 MG tablet Take 2 tablets by mouth daily as needed (Patient preference, Sennakot S for mild constipation). Patient taking differently: Take 2 tablets by mouth daily as needed for mild constipation. 2 tabs 2 x daily 07/01/23   Setzer, Sandra J, [name]  spironolactone  (ALDACTONE ) 25 MG tablet Take 1 tablet (25 mg total) by mouth daily. 07/26/23   Tania Familia, NP  XTAMPZA  ER 13.5 MG C12A Take 1 capsule by  mouth in the morning and at bedtime. 12/25/23   Pickenpack-CousarGiles Labrum, NP    Physical Exam    Vital Signs:  Amberle  D Everitt does not have vital signs available for review today.***  Given telephonic nature of communication, physical exam is limited. AAOx3. NAD. Normal affect.  Speech and respirations are unlabored.  Accessory Clinical Findings    None  Assessment & Plan    1.  Pericardial effusion  2.  VF arrest:   3.  HFrEF/NICM  4.  Essential hypertension  5.  Hyperlipidemia  6. NSCLC with Mets:         Time:   Today, I have spent *** minutes with the patient with telehealth technology discussing medical history, symptoms, and management plan.     Francene Ing, Retha Cast, NP  01/12/2024, 12:03 PM

## 2024-01-13 ENCOUNTER — Encounter: Payer: Self-pay | Admitting: Nurse Practitioner

## 2024-01-13 ENCOUNTER — Encounter: Payer: Self-pay | Admitting: Hematology and Oncology

## 2024-01-13 ENCOUNTER — Ambulatory Visit: Attending: Nurse Practitioner | Admitting: Nurse Practitioner

## 2024-01-13 VITALS — BP 128/97 | HR 78 | Ht 62.0 in | Wt 181.0 lb

## 2024-01-13 DIAGNOSIS — I429 Cardiomyopathy, unspecified: Secondary | ICD-10-CM | POA: Diagnosis present

## 2024-01-13 DIAGNOSIS — I10 Essential (primary) hypertension: Secondary | ICD-10-CM | POA: Diagnosis present

## 2024-01-13 DIAGNOSIS — E785 Hyperlipidemia, unspecified: Secondary | ICD-10-CM | POA: Diagnosis present

## 2024-01-13 DIAGNOSIS — C7951 Secondary malignant neoplasm of bone: Secondary | ICD-10-CM | POA: Insufficient documentation

## 2024-01-13 DIAGNOSIS — C3490 Malignant neoplasm of unspecified part of unspecified bronchus or lung: Secondary | ICD-10-CM | POA: Insufficient documentation

## 2024-01-13 DIAGNOSIS — I3139 Other pericardial effusion (noninflammatory): Secondary | ICD-10-CM | POA: Insufficient documentation

## 2024-01-13 DIAGNOSIS — I469 Cardiac arrest, cause unspecified: Secondary | ICD-10-CM | POA: Insufficient documentation

## 2024-01-13 NOTE — Progress Notes (Signed)
 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 CT of the head showed showed concerning for metastatic disease involving the left cerebellum and right occipital lobe.  2) 09/30/2019: MRI brain confirms mass within the left cerebellum measures 1.6 x 1.3 x 1.5 cm as well as at least 8 metastatic lesions within the supratentorial and infratentorial brain as outlined 3) 10/01/2019: PET CT scan revealed hypermetabolic infrahilar right lower lobe mass with hypermetabolic nodal metastases in the right hilum, mediastinum, right supraclavicular region, left axilla and lower left neck. 4) 10/08/2019: US  guided biopsy of the lymph nodes confirms poorly differentiated non-small cell carcinoma. Immunohistochemistry confirms adenocarcinoma of lung primary. PD-L1 and NGS later revealed a MET Exon 14 mutation and TPS of 90%.  5) 10/12/2019: Establish care with Dr. Rosaline Coma  6) 11/04/2019: Day 1 of capmatinib 400mg  BID 7) 11/09/2019: presented to Rad/Onc with a DVT in LLE. Seen in symptom management clinic and started on apixaban .  8) 12/29/2019: CT C/A/P showed interval decrease in size of mass involving the superior segment of right lower lobe and reduction in mediastinal and left supraclavicular adenopathy though some small spinal lesions were noted in the interim between the two sets of  scans 9) 01/25/2020: temporarily stopped capmatinib due to symptoms of nausea/fatigue. Restarted therapy on 01/30/2020 after brief chemo holiday.  10) 03/22/2020: CT C/A/P reveals a mixed picture, with new lung nodule and increased lymphadenopathy, however response in bone lesions. D/c capmatinib therapy 11) 04/08/2020: start of Carbo/Pem/Pem for 2nd line treatment. Cycle 1 Day 1   12) 04/29/2020: Cycle 2 Day 1 Carbo/Pem/Pem  13) 05/20/2020: Cycle 3 Day 1 Carbo/Pem/Pem  14) 06/09/2020: Cycle 4 Day 1 Carbo/Pem/Pem  15) 06/29/2020: Cycle 5 Day 1 maintenance Pem/Pem  16) 07/20/2020: Cycle 6 Day 1 maintenance Pem/Pem  17) 08/12/2020: Cycle 7 Day 1 maintenance Pem/Pem  18) 09/01/2020: Cycle 8 Day 1 maintenance Pem/Pem  19) 09/21/2020: Cycle 9 Day 1 maintenance Pem/Pem  20) 10/13/2020: Cycle 10 Day 1 maintenance Pem/Pem  21) 11/03/2020: Cycle 11 Day 1 maintenance Pem/Pem  22) 11/25/2020:  Cycle 12 Day 1 maintenance Pem/Pem 23) 12/15/2020:  Cycle 13 Day 1 maintenance Pem/Pem 24) 01/05/2021:  Cycle 14 Day 1 maintenance Pem/Pem 25) 01/26/2021:  Cycle 15 Day 1 maintenance Pem/Pem plus Bevacizumab .  02/17/2021: Cycle 16 Day 1 maintenance Pem/Pem plus Bevacizumab .  03/10/2021: Cycle 17 Day 1 maintenance Pem/Pem plus Bevacizumab .  03/31/2021: Cycle 18 Day 1 maintenance Pem/Pem  04/20/2021:  Cycle 19 Day 1 maintenance Pem/Pem  05/12/2021: Cycle 20 Day 1 maintenance Pem/Pem 06/01/2021: Cycle 21 Day 1 maintenance Pem/Pem 06/22/2021: Cycle 22 Day 1 maintenance Pem/Pem 07/13/2021: Cycle 23 Day 1 maintenance Pem/Pem 08/16/2021: Cycle 24 Day 1 maintenance Pem/Pem 09/08/2021: Cycle 25 Day 1 maintenance Pem/Pem plus Bevacizumab  09/28/2021: Cycle 26 Day 1 maintenance Pem/Pem plus Bevacizumab  10/20/2021: Cycle 27 Day 1 maintenance Pem/Pem plus Bevacizumab  11/09/2021: Cycle 28 Day 1 maintenance Pem/Pem  12/06/2021: Cycle 29 Day 1 maintenance Pem/Pem  12/27/2021: Cycle  30 Day 1 maintenance Pem/Pem  01/18/2022: Cycle 31 Day 1 maintenance  Pem/Pem  02/07/2022: Cycle 32 Day 1 maintenance Pem/Pem  02/28/2022: Cycle 33 Day 1 maintenance Pem/Pem  03/23/2022: Cycle 34 Day 1 maintenance Pem/Pem  04/13/2022: Cycle 35 Day 1 maintenance Pem/Pem  05/04/2022: Cycle 36 Day 1 maintenance Pem/Pem  05/25/2022: Cycle 37 Day 1 maintenance Pem/Pem  06/14/2022: Cycle 38 Day 1 maintenance Pem/Pem 07/05/2022: Cycle 39 Day 1 maintenance Pem/Pem 08/13/2022: Cycle 40 Day 1 maintenance Pem/Pem 09/06/2022:Cycle 41 Day 1 maintenance Pem/Pem 09/27/2022: Cycle 42 Day 1 maintenance Pem/Pem 10/19/2022: Cycle 43 Day 1 maintenance Pem/Pem 11/09/2022: Cycle 44 Day 1 maintenance Pem/Pem 11/30/2022: Cycle 45 Day 1 maintenance Pem/Pem 12/20/2022: Cycle 46 Day 1 maintenance Pem/Pem 01/10/2023: Cycle 47 Day 1 maintenance Pem/Pem 01/31/2023:Cycle 48 Day 1 maintenance Pem/Pem 02/22/2023: Cycle 49 Day 1 maintenance Pem/Pem 03/15/2023: Cycle 50 Day 1 maintenance Pem/Pem 04/04/2023: Cycle 51 Day 1 maintenance Pem/Pem 04/26/2023: Cycle 52 Day 1 maintenance Pem/Pem 05/17/2023: Cycle 53 Day 1 maintenance Pem/Pem 07/19/2023: Cycle 54 Day 1 maintenance Pembro (holding Pemetrexed ) 08/12/2023: Cycle 55 Day 1 maintenance Pem/Pem 09/02/2023: Cycle 56 Day 1 maintenance Pembro (holding Pemetrexed ) 09/23/2023: Cycle 57 Day 1 maintenance Pembro (holding Pemetrexed ) 10/15/2023: Cycle 58 Day 1 maintenance Pembro (holding Pemetrexed ) 11/27/2023:  Cycle 59 Day 1 maintenance Pembro (holding Pemetrexed ) 01/10/2024: Cycle 60 Day 1 maintenance Pembro (holding Pemetrexed )  Interval History:  Virginia [name] 68 y.o. female with medical history significant for metastatic adenocarcinoma of the lung presents for a follow up visit. The patient's last visit was on 12/20/2023.  On exam today Virginia [name] reports persistent dizziness with no improvement with dexamethasone  therapy.  She stopped Eliquis  therapy 3 weeks ago due to intracranial bleeding noted on most recent MRI brian. She denies any overt signs of  bleeding.  She reports her energy levels are at baseline and does require assistance to complete her ADLs.  She denies nausea, vomiting or bowel habit changes.She denies any fevers, chills, sweats or chest pain. She has no other complaints.  Full 10 point ROS was otherwise negative.  MEDICAL HISTORY:  Past Medical History:  Diagnosis Date   Anemia    Anxiety    Concussion 09/28/2019   DVT (deep venous thrombosis) (HCC) 2021   L leg   Dyspnea    GERD (gastroesophageal reflux disease)    Hypercholesterolemia    per pt, she does not have elevated lipids   Hypertension    met lung ca dx'd 09/2019   mets to spine, hip and brain   PONV (postoperative nausea and vomiting)    Tobacco abuse     SURGICAL HISTORY: Past Surgical History:  Procedure Laterality Date   ABDOMINAL HYSTERECTOMY     partial/ left ovaries   APPLICATION OF CRANIAL NAVIGATION N/A 12/02/2020   Procedure: APPLICATION OF CRANIAL NAVIGATION;  Surgeon: Manya Sells, MD;  Location: [facility] OR;  Service: Neurosurgery;  Laterality: N/A;   CHOLECYSTECTOMY     CRANIOTOMY N/A 12/02/2020   Procedure: Posterior fossa craniotomy for tumor resection with brainlab;  Surgeon: Manya Sells, MD;  Location: [facility] OR;  Service: Neurosurgery;  Laterality: N/A;   DILATION AND CURETTAGE OF UTERUS     IR IMAGING GUIDED PORT INSERTION  10/23/2019   IR PATIENT EVAL TECH 0-60 MINS  11/21/2021   IR PATIENT EVAL TECH 0-60 MINS  11/24/2021   IR PATIENT EVAL TECH 0-60 MINS  11/29/2021   IR PATIENT EVAL TECH 0-60 MINS  12/04/2021   IR PATIENT EVAL TECH  0-60 MINS  12/12/2021   IR PATIENT EVAL TECH 0-60 MINS  12/19/2021   IR PATIENT EVAL TECH 0-60 MINS  12/27/2021   IR PATIENT EVAL TECH 0-60 MINS  01/03/2022   IR PATIENT EVAL TECH 0-60 MINS  01/10/2022   IR PATIENT EVAL TECH 0-60 MINS  01/18/2022   IR PATIENT EVAL TECH 0-60 MINS  02/13/2022   IR PATIENT EVAL TECH 0-60 MINS  02/20/2022   IR RADIOLOGIST EVAL & MGMT  01/23/2022   IR REMOVAL TUN ACCESS W/ PORT W/O FL MOD  SED  11/17/2021   KYPHOPLASTY N/A 03/15/2020   Procedure: Thoracic Eight KYPHOPLASTY;  Surgeon: Manya Sells, MD;  Location: [facility] OR;  Service: Neurosurgery;  Laterality: N/A;  prone    LIPOMA EXCISION  2018   removed under left breast and right thigh.   PERICARDIOCENTESIS N/A 12/10/2023   Procedure: PERICARDIOCENTESIS;  Surgeon: Kyra Phy, MD;  Location: [facility] INVASIVE CV LAB;  Service: Cardiovascular;  Laterality: N/A;   TUBAL LIGATION      ALLERGIES:  is allergic to scopolamine .  MEDICATIONS:  Current Outpatient Medications  Medication Sig Dispense Refill   acetaminophen  (TYLENOL ) 325 MG tablet Take 1-2 tablets (325-650 mg total) by mouth every 4 (four) hours as needed for mild pain (pain score 1-3). (Patient taking differently: Take 500 mg by mouth every 4 (four) hours as needed for mild pain (pain score 1-3).)     albuterol  (VENTOLIN  HFA) 108 (90 Base) MCG/ACT inhaler INHALE 2 PUFFS INTO THE LUNGS EVERY 6 HOURS AS NEEDED FOR WHEEZING OR SHORTNESS OF BREATH 6.7 g 11   ALPRAZolam  (XANAX ) 0.25 MG tablet Take 1 tablet (0.25 mg total) by mouth 2 (two) times daily as needed for anxiety. 30 tablet 0   Cholecalciferol  (VITAMIN D3) 125 MCG (5000 UT) TABS Take 5,000 Units by mouth daily.     colchicine  0.6 MG tablet Take 1 tablet (0.6 mg total) by mouth daily. 90 tablet 0   Cyanocobalamin  (VITAMIN B12 PO) Take 5,000 mcg by mouth daily. Gummies     dexamethasone  (DECADRON ) 1 MG tablet Take 1 tablet (1 mg total) by mouth daily. (Patient taking differently: Take 1 mg by mouth as needed. Every 30 hours) 90 tablet 0   hydrocortisone  (ANUSOL -HC) 2.5 % rectal cream use in rectal area 2 to 3 times a day as needed 30 g PRN   levETIRAcetam  (KEPPRA ) 1000 MG tablet Take 1 tablet (1,000 mg total) by mouth 2 (two) times daily. 180 tablet 2   magnesium  oxide (MAG-OX) 400 (240 Mg) MG tablet Take 1 tablet (400 mg total) by mouth 2 (two) times daily. 60 tablet 0   meclizine  (ANTIVERT ) 50 MG tablet Take 1 tablet (50  mg total) by mouth 3 (three) times daily. (Patient taking differently: Take 100 mg by mouth 3 (three) times daily.) 90 tablet 2   metoprolol  succinate (TOPROL -XL) 25 MG 24 hr tablet Take 1 tablet (25 mg total) by mouth daily. 90 tablet 3   Multiple Vitamin (MULTIVITAMIN WITH MINERALS) TABS tablet Take 2 tablets by mouth daily. Gummy     Nutritional Supplements (,FEEDING SUPPLEMENT, PROSOURCE PLUS) liquid Take 30 mLs by mouth 2 (two) times daily between meals. (Patient taking differently: Take 30 mLs by mouth 4 (four) times daily. Berry flavor)     OLANZapine  (ZYPREXA ) 2.5 MG tablet Take 1 tablet (2.5 mg total) by mouth at bedtime. 90 tablet 1   ondansetron  (ZOFRAN ) 4 MG tablet TAKE 1 TABLET(4 MG) BY MOUTH EVERY 8 HOURS  AS NEEDED FOR NAUSEA OR VOMITING 40 tablet 2   oxyCODONE  (OXY IR/ROXICODONE ) 5 MG immediate release tablet Take 1 tablet (5 mg total) by mouth every 4 (four) hours as needed for severe pain (pain score 7-10). 90 tablet 0   pantoprazole  (PROTONIX ) 40 MG tablet TAKE 1 TABLET(40 MG) BY MOUTH DAILY 90 tablet 3   polyethylene glycol (MIRALAX  / GLYCOLAX ) 17 g packet Take 17 g by mouth daily as needed for moderate constipation or mild constipation.     potassium chloride  SA (KLOR-CON  M) 20 MEQ tablet TAKE 1 TABLET(20 MEQ) BY MOUTH TWICE DAILY 90 tablet 1   senna-docusate (SENOKOT-S) 8.6-50 MG tablet Take 2 tablets by mouth daily as needed (Patient preference, Sennakot S for mild constipation). (Patient taking differently: Take 2 tablets by mouth daily as needed for mild constipation. 2 tabs 2 x daily)     spironolactone  (ALDACTONE ) 25 MG tablet Take 1 tablet (25 mg total) by mouth daily. 90 tablet 3   XTAMPZA  ER 13.5 MG C12A Take 1 capsule by mouth in the morning and at bedtime. 60 capsule 0   No current facility-administered medications for this visit.    REVIEW OF SYSTEMS:   Constitutional: ( - ) fevers, ( - )  chills , ( - ) night sweats Eyes: ( - ) blurriness of vision, ( - ) double  vision, ( - ) watery eyes Ears, nose, mouth, throat, and face: ( - ) mucositis, ( - ) sore throat Respiratory: ( - ) cough, ( + ) dyspnea, ( - ) wheezes Cardiovascular: ( - ) palpitation, ( - ) chest discomfort, ( - ) lower extremity swelling Gastrointestinal:  ( + ) nausea, ( - ) heartburn, ( - ) change in bowel habits Skin: ( - ) abnormal skin rashes Lymphatics: ( - ) new lymphadenopathy, ( - ) easy bruising Neurological: ( - ) numbness, ( - ) tingling, ( - ) new weaknesses Behavioral/Psych: ( - ) mood change, ( - ) new changes  All other systems were reviewed with the patient and are negative.  PHYSICAL EXAMINATION: ECOG PERFORMANCE STATUS: 1 - Symptomatic but completely ambulatory  Vitals:   01/10/24 1300  BP: (!) 133/91   There were no vitals filed for this visit.   GENERAL: well appearing middle aged Caucasian female in NAD  SKIN: skin color, texture, turgor are normal, no rashes or significant lesions EYES: conjunctiva are pink and non-injected, sclera clear LUNGS: wheezing at lung bases. clear to auscultation and percussion with normal breathing effort HEART: regular rate & rhythm and no murmurs and bilateral trace pitting lower extremity edema Musculoskeletal: no cyanosis of digits and no clubbing PSYCH: alert & oriented x 3, fluent speech NEURO: no focal motor/sensory deficits  LABORATORY DATA:  I have reviewed the data as listed    Latest Ref Rng & Units 01/10/2024   12:18 PM 12/20/2023    1:01 PM 12/12/2023    2:14 AM  CBC  WBC 4.0 - 10.5 K/uL 9.0  7.1  15.6   Hemoglobin 12.0 - 15.0 g/dL 65.7  84.6  96.2   Hematocrit 36.0 - 46.0 % 37.0  37.5  38.8   Platelets 150 - 400 K/uL 194  369  246        Latest Ref Rng & Units 01/10/2024   12:18 PM 12/20/2023    1:01 PM 12/12/2023    2:14 AM  CMP  Glucose 70 - 99 mg/dL 75  952  841   BUN  8 - 23 mg/dL 37  40  43   Creatinine 0.44 - 1.00 mg/dL 9.60  4.54  0.98   Sodium 135 - 145 mmol/L 140  143  134   Potassium 3.5 - 5.1  mmol/L 4.9  4.0  3.8   Chloride 98 - 111 mmol/L 106  108  98   CO2 22 - 32 mmol/L 30  28  26    Calcium  8.9 - 10.3 mg/dL 9.3  9.0  8.7   Total Protein 6.5 - 8.1 g/dL 6.0  6.5    Total Bilirubin 0.0 - 1.2 mg/dL 0.3  0.3    Alkaline Phos 38 - 126 U/L 37  43    AST 15 - 41 U/L 13  11    ALT 0 - 44 U/L 17  15       RADIOGRAPHIC STUDIES: I personally have viewed the radiographic studies below: MR BRAIN W WO CONTRAST Result Date: 12/20/2023 CLINICAL DATA:  CNS neoplasm, assess treatment response. History of metastatic lung cancer EXAM: MRI HEAD WITHOUT AND WITH CONTRAST TECHNIQUE: Multiplanar, multiecho pulse sequences of the brain and surrounding structures were obtained without and with intravenous contrast. CONTRAST:  7 cc of UA intravenous COMPARISON:  10/08/2023 FINDINGS: Brain: Widespread enhancing brain lesions. Dominant change since prior is multifocal T1 hyperintense expansion consistent with subacute hemorrhage, seen multifocal in the left cerebellum where there is extensive enhancing disease, the largest clot measuring up to 2.3 cm near the distorted fourth ventricle, but no hydrocephalus. The extent of enhancing disease in the posterior fossa separate from this area of intrinsic T1 shortening is not appreciably changed. Additional patchy hemorrhage with T1 shortening involving a right parietal lesion that has enlarged and shows solid enhancement along the upper extent, lesion now measuring 2.8 cm craniocaudal. Extensive vasogenic edema in this area with mild progression. Numerous other enhancing lesions in the bilateral cerebrum are unchanged. Extensive vasogenic edema in the cerebellum but patent fourth ventricle. Mild to moderate vasogenic edema in the anterior left frontal lobe. Background nonenhancing chronic white matter disease. No acute infarct. Patient is presenting to cancer clinic today. Vascular: Major flow voids and vascular enhancements are preserved. Skull and upper cervical spine:  Unremarkable left suboccipital craniotomy site. Sinuses/Orbits: Negative Prelim sent in epic chat. IMPRESSION: Multiple enhancing brain lesions from metastatic disease or treatment with multifocal subacute hemorrhage since 10/08/2023. Hemorrhage has occurred in left cerebellar disease and a right parietal lesion as described above. The largest hematoma is in the deep left cerebellum measuring 2.3 cm. There is some mass effect on the fourth ventricle but no hydrocephalus. Extensive vasogenic edema in the cerebellum and posterior right cerebral hemisphere with mild increase from prior. No new brain lesion. Electronically Signed   By: Ronnette Coke M.D.   On: 12/20/2023 09:12     ASSESSMENT & PLAN Virginia [name] is a 68 y.o. female with medical history significant for metastatic adenocarcinoma of the lung presents for a follow up visit. 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   Previously we discussed Carboplatin /Pembrolizumab /Pemetrexed  and the expected side effect from these medications.  We discussed the possible side effects of immunotherapy including colitis, hepatitis, dermatitis, and pneumonitis.  Additionally we discussed the side effects of carboplatin  which include suppression of blood counts, nephrotoxicity, and nausea.  Additionally we discussed pemetrexed  which can also have effects on counts and would require supplementation with vitamin B12 and folic acid .  She voiced her understanding  of these side effects and treatment plans moving forward.  I also noted that we would be able to drop the chemotherapy agents that she had intolerance to continue with pembrolizumab  alone given her high TPS score.  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  Due to worsening dizziness, she resumed this from Cycle 25-27.  PLAN: --Due for Cycle 60 Day 1 of maintenance single agent Pembrolizumab  today.  --Labs from today were reviewed and adequate for treatment.  Labs today show WBC 9.0, hemoglobin 12.4, platelet 194, creatinine and LFTs in range. --Last CT scan in Feb 2025 showed no evidence of progressive disease.  Repeat CT scan due May 202, ordered today --RTC q 3 weeks for labs, follow up visit prior to Cycle 61.  #Brain Metastasis --appreciate the assistance of Dr. Mark Sil in treating her brain metastasis and symptoms. --radiation therapy to the brain completed on 11/16/2019.  --MRI brain on 04/29/2020 showed evidence of small brain bleed. Eliquis  therapy was discontinued.  --MRI on 11/04/2020 showed concern for progression of intracranial disease.  --On 12/02/2020, patient underwent craniotomy, resection of progressive cerebellar mass by Dr. Nigel Bart. 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  --MRI on 12/23/2020 showed evidence of radiation necrosis. Dr. Mark Sil evaluated the patient on 01/02/2021 with recommendations to add IV Bevacizumab  10 mg/kg q 3 weeks ( started 01/26/2021). Completed a full course of this therapy. --Patient was evaluated by Dr. Mark Sil on 08/29/2021 and recommended to resume Bevacizumab  due to worsening dizziness.  Which was given from Cycle 25-27.  Plan: --continue to follow with Dr. Mark Sil  --Most recent MRI from 12/18/2023 showed evidence of intracranial bleeding.  Eliquis  held indefinitely.   #Nausea/Vomiting, worsening --Regimen includes Zofran  4-8mg  q8h PRN and compazine  10mg  q6H for breakthrough PRN, Olanzpaine 2.5 mg PO QHS to help with nausea.  She is currently using Phenergan  for breakthrough. --continue to monitor   #Hypokalemia --likely 2/2 to decreased PO intake, hypovolemia, and BP medications --Potassium level 4.9 today.   --Currently on potassium chloride  tablets, 40 mEq PO potassium in AM and 20mEq in PM.    #Pain Control, stable --continue oxycodone  5mg  q4H PRN. Can increase dose to 10mg  q6H if pain is severe. She is taking approximately 3-4 pills per day.  --continue Xtampza  13.5 mg ER BID for long acting support.  --continue to take with Senokot to prevent opioid induced constipation. 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 Next dose due in March 2025.    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    # Hypertension: --Continue Norvasc  to 10 mg daily.  --continue to monitor at home and in clinic.    Orders Placed This Encounter  Procedures   TSH    Standing Status:   Future    Expected Date:   02/21/2024    Expiration Date:   02/20/2025   CBC with Differential (Cancer Center Only)    Standing Status:   Future    Expected Date:   02/21/2024    Expiration Date:   02/20/2025   CMP (Cancer Center only)    Standing Status:   Future  Expected Date:   02/21/2024    Expiration Date:   02/20/2025   TSH    Standing Status:   Future    Expected Date:   03/13/2024    Expiration Date:   03/13/2025   CBC with Differential (Cancer Center Only)    Standing Status:   Future    Expected Date:   03/13/2024    Expiration Date:   03/13/2025   CMP (Cancer Center only)    Standing Status:   Future    Expected Date:   03/13/2024    Expiration Date:   03/13/2025   TSH    Standing Status:   Future    Expected Date:   04/03/2024    Expiration Date:   04/03/2025   CBC with Differential (Cancer Center Only)    Standing Status:   Future    Expected Date:   04/03/2024    Expiration Date:   04/03/2025   CMP (Cancer Center only)    Standing Status:   Future    Expected Date:   04/03/2024    Expiration Date:   04/03/2025    All questions were answered. The patient knows to call  the clinic with any problems, questions or concerns.  I have spent a total of 30 minutes minutes of face-to-face and non-face-to-face time, preparing to see the patient, performing a medically appropriate examination, counseling and educating the patient,  documenting clinical information in the electronic health record, and care coordination.   Wyline Hearing [name] Dept of Hematology and Oncology [facility] Phone: [phone]    01/13/2024 10:42 PM   Literature Support:  Almarie Jacob, Rodrguez-Abreu D, Gadgeel S, Esteban E, Felip E, Loraine F, Domine M, [place], Hochmair MJ, [place], Frederika, Bischoff HG, Peled N, Grossi F, Jennens RR, Reck M, [place], [place], [place], Rubio-Viqueira B, Novello S, Kurata T, Gray JE, Vida J, Wei Z, Yang J, Raftopoulos H, [place], Robert Lee [employer]; [id number] Investigators. Pembrolizumab  plus Chemotherapy in Metastatic Non-Small-Cell Lung Cancer. Mel Spine Med. 2018 May 31;378(22):2078-2092.  --Median progression-free survival was 8.8 months (95% CI, 7.6 to 9.2) in the pembrolizumab -combination group and 4.9 months (95% CI, 4.7 to 5.5) in the placebo-combination group (hazard ratio for disease progression or death, 0.52; 95% CI, 0.43 to 0.64; P<0.001). Adverse events of grade 3 or higher occurred in 67.2% of the patients in the pembrolizumab -combination group and in 65.8% of those in the placebo-combination group.  Jasper Messenger L. Efficacy of pemetrexed -based regimens in advanced non-small cell lung cancer patients with activating epidermal growth factor receptor mutations after tyrosine kinase inhibitor failure: a systematic review. Onco Targets Ther. 2018;11:2121-2129.   --The weighted median PFS, median OS, and ORR for patients treated with pem regimens were 5.09 months, 15.91 months, and 30.19%, respectively. Our systematic review results showed a favorable efficacy profile of pem regimens in NSCLC  patients with EGFR mutation after EGFR-TKI failure.

## 2024-01-13 NOTE — Telephone Encounter (Signed)
 Dr Liane Redman already filled out re-certification

## 2024-01-13 NOTE — Patient Instructions (Signed)
 Medication Instructions:  Take Lasix  20mg  as needed *If you need a refill on your cardiac medications before your next appointment, please call your pharmacy*  Lab Work: None ordered If you have labs (blood work) drawn today and your tests are completely normal, you will receive your results only by: MyChart Message (if you have MyChart) OR A paper copy in the mail If you have any lab test that is abnormal or we need to change your treatment, we will call you to review the results.  Testing/Procedures: None ordered   Follow-Up: At [facility], you and your health needs are our priority.  As part of our continuing mission to provide you with exceptional heart care, our providers are all part of one team.  This team includes your primary Cardiologist (physician) and Advanced Practice Providers or APPs (Physician Assistants and Nurse Practitioners) who all work together to provide you with the care you need, when you need it.  Your next appointment:   1 month(s) Virtual   Provider:   Charles Connor, NP        We recommend signing up for the patient portal called "MyChart".  Sign up information is provided on this After Visit Summary.  MyChart is used to connect with patients for Virtual Visits (Telemedicine).  Patients are able to view lab/test results, encounter notes, upcoming appointments, etc.  Non-urgent messages can be sent to your provider as well.   To learn more about what you can do with MyChart, go to [email or web address].   Other Instructions

## 2024-01-21 ENCOUNTER — Ambulatory Visit: Payer: Medicare Other | Admitting: Adult Health

## 2024-01-21 ENCOUNTER — Other Ambulatory Visit: Payer: Self-pay | Admitting: Internal Medicine

## 2024-01-23 ENCOUNTER — Other Ambulatory Visit: Payer: Self-pay | Admitting: Nurse Practitioner

## 2024-01-23 DIAGNOSIS — C3490 Malignant neoplasm of unspecified part of unspecified bronchus or lung: Secondary | ICD-10-CM

## 2024-01-23 DIAGNOSIS — Z515 Encounter for palliative care: Secondary | ICD-10-CM

## 2024-01-23 DIAGNOSIS — G893 Neoplasm related pain (acute) (chronic): Secondary | ICD-10-CM

## 2024-01-23 MED ORDER — XTAMPZA ER 13.5 MG PO C12A: 1.0000 | EXTENDED_RELEASE_CAPSULE | Freq: Two times a day (BID) | ORAL | 0 refills | Status: DC

## 2024-01-26 ENCOUNTER — Encounter: Payer: Self-pay | Admitting: Internal Medicine

## 2024-01-27 ENCOUNTER — Encounter (HOSPITAL_COMMUNITY): Payer: Self-pay

## 2024-01-27 ENCOUNTER — Other Ambulatory Visit: Payer: Self-pay | Admitting: *Deleted

## 2024-01-27 ENCOUNTER — Ambulatory Visit (HOSPITAL_COMMUNITY)
Admission: RE | Admit: 2024-01-27 | Discharge: 2024-01-27 | Disposition: A | Discharge status: Home / Self Care | Source: Ambulatory Visit | Attending: Physician Assistant | Admitting: Physician Assistant

## 2024-01-27 DIAGNOSIS — R918 Other nonspecific abnormal finding of lung field: Secondary | ICD-10-CM | POA: Diagnosis present

## 2024-01-27 DIAGNOSIS — C7931 Secondary malignant neoplasm of brain: Secondary | ICD-10-CM | POA: Insufficient documentation

## 2024-01-27 MED ORDER — IOHEXOL 300 MG/ML  SOLN
100.0000 mL | Freq: Once | INTRAMUSCULAR | Status: AC | PRN
  Administered 2024-01-27: 100 mL via INTRAVENOUS

## 2024-01-27 MED ORDER — SODIUM CHLORIDE (PF) 0.9 % IJ SOLN
INTRAMUSCULAR | Status: AC
  Filled 2024-01-27: qty 50

## 2024-01-27 MED ORDER — DEXAMETHASONE 1 MG PO TABS: 1.0000 mg | ORAL_TABLET | Freq: Every day | ORAL | 0 refills | Status: DC

## 2024-01-28 ENCOUNTER — Other Ambulatory Visit: Payer: Self-pay | Admitting: Nurse Practitioner

## 2024-01-28 DIAGNOSIS — C3490 Malignant neoplasm of unspecified part of unspecified bronchus or lung: Secondary | ICD-10-CM

## 2024-01-28 DIAGNOSIS — G893 Neoplasm related pain (acute) (chronic): Secondary | ICD-10-CM

## 2024-01-28 DIAGNOSIS — Z515 Encounter for palliative care: Secondary | ICD-10-CM

## 2024-01-28 MED ORDER — ALPRAZOLAM 0.25 MG PO TABS: 0.2500 mg | ORAL_TABLET | Freq: Two times a day (BID) | ORAL | 0 refills | Status: DC | PRN

## 2024-01-28 MED ORDER — OXYCODONE HCL 5 MG PO TABS: 5.0000 mg | ORAL_TABLET | ORAL | 0 refills | Status: DC | PRN

## 2024-01-31 ENCOUNTER — Inpatient Hospital Stay

## 2024-01-31 ENCOUNTER — Inpatient Hospital Stay (HOSPITAL_BASED_OUTPATIENT_CLINIC_OR_DEPARTMENT_OTHER): Admitting: Physician Assistant

## 2024-01-31 ENCOUNTER — Encounter: Payer: Self-pay | Admitting: Hematology and Oncology

## 2024-01-31 ENCOUNTER — Other Ambulatory Visit: Payer: Self-pay | Admitting: Physician Assistant

## 2024-01-31 VITALS — BP 127/65 | HR 73 | Temp 97.6°F | Resp 16

## 2024-01-31 VITALS — BP 128/96 | Temp 97.7°F | Resp 18 | Ht 62.0 in | Wt 167.5 lb

## 2024-01-31 DIAGNOSIS — C3490 Malignant neoplasm of unspecified part of unspecified bronchus or lung: Secondary | ICD-10-CM

## 2024-01-31 DIAGNOSIS — R946 Abnormal results of thyroid function studies: Secondary | ICD-10-CM

## 2024-01-31 DIAGNOSIS — C3491 Malignant neoplasm of unspecified part of right bronchus or lung: Secondary | ICD-10-CM | POA: Diagnosis not present

## 2024-01-31 DIAGNOSIS — C7931 Secondary malignant neoplasm of brain: Secondary | ICD-10-CM

## 2024-01-31 DIAGNOSIS — Z5112 Encounter for antineoplastic immunotherapy: Secondary | ICD-10-CM

## 2024-01-31 DIAGNOSIS — R918 Other nonspecific abnormal finding of lung field: Secondary | ICD-10-CM

## 2024-01-31 DIAGNOSIS — C3431 Malignant neoplasm of lower lobe, right bronchus or lung: Secondary | ICD-10-CM | POA: Diagnosis not present

## 2024-01-31 LAB — CMP (CANCER CENTER ONLY)
ALT: 12 U/L (ref 0–44)
AST: 12 U/L — ABNORMAL LOW (ref 15–41)
Albumin: 3.7 g/dL (ref 3.5–5.0)
Alkaline Phosphatase: 39 U/L (ref 38–126)
Anion gap: 6 (ref 5–15)
BUN: 42 mg/dL — ABNORMAL HIGH (ref 8–23)
CO2: 28 mmol/L (ref 22–32)
Calcium: 9.4 mg/dL (ref 8.9–10.3)
Chloride: 108 mmol/L (ref 98–111)
Creatinine: 1.07 mg/dL — ABNORMAL HIGH (ref 0.44–1.00)
GFR, Estimated: 57 mL/min — ABNORMAL LOW (ref >60)
Glucose, Bld: 85 mg/dL (ref 70–99)
Potassium: 3.9 mmol/L (ref 3.5–5.1)
Sodium: 142 mmol/L (ref 135–145)
Total Bilirubin: 0.3 mg/dL (ref 0.0–1.2)
Total Protein: 6.5 g/dL (ref 6.5–8.1)

## 2024-01-31 LAB — CBC WITH DIFFERENTIAL (CANCER CENTER ONLY)
Abs Immature Granulocytes: 0.11 10*3/uL — ABNORMAL HIGH (ref 0.00–0.07)
Basophils Absolute: 0.1 10*3/uL (ref 0.0–0.1)
Basophils Relative: 1 %
Eosinophils Absolute: 0.1 10*3/uL (ref 0.0–0.5)
Eosinophils Relative: 1 %
HCT: 38.7 % (ref 36.0–46.0)
Hemoglobin: 13.4 g/dL (ref 12.0–15.0)
Immature Granulocytes: 1 %
Lymphocytes Relative: 37 %
Lymphs Abs: 2.9 10*3/uL (ref 0.7–4.0)
MCH: 32 pg (ref 26.0–34.0)
MCHC: 34.6 g/dL (ref 30.0–36.0)
MCV: 92.4 fL (ref 80.0–100.0)
Monocytes Absolute: 0.9 10*3/uL (ref 0.1–1.0)
Monocytes Relative: 12 %
Neutro Abs: 3.8 10*3/uL (ref 1.7–7.7)
Neutrophils Relative %: 48 %
Platelet Count: 259 10*3/uL (ref 150–400)
RBC: 4.19 MIL/uL (ref 3.87–5.11)
RDW: 12.8 % (ref 11.5–15.5)
WBC Count: 8 10*3/uL (ref 4.0–10.5)
nRBC: 0 % (ref 0.0–0.2)

## 2024-01-31 LAB — TSH: TSH: 2.74 u[IU]/mL (ref 0.350–4.500)

## 2024-01-31 MED ORDER — SODIUM CHLORIDE 0.9 % IV SOLN: Freq: Once | INTRAVENOUS | Status: AC

## 2024-01-31 MED ORDER — SODIUM CHLORIDE 0.9 % IV SOLN
200.0000 mg | Freq: Once | INTRAVENOUS | Status: AC
  Administered 2024-01-31: 200 mg via INTRAVENOUS
  Filled 2024-01-31: qty 200

## 2024-01-31 MED ORDER — PROCHLORPERAZINE MALEATE 10 MG PO TABS
10.0000 mg | ORAL_TABLET | Freq: Once | ORAL | Status: AC
  Administered 2024-01-31: 10 mg via ORAL
  Filled 2024-01-31: qty 1

## 2024-02-02 ENCOUNTER — Encounter: Payer: Self-pay | Admitting: Hematology and Oncology

## 2024-02-02 NOTE — Progress Notes (Signed)
 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 CT of the head showed showed concerning for metastatic disease involving the left cerebellum and right occipital lobe.  2) 09/30/2019: MRI brain confirms mass within the left cerebellum measures 1.6 x 1.3 x 1.5 cm as well as at least 8 metastatic lesions within the supratentorial and infratentorial brain as outlined 3) 10/01/2019: PET CT scan revealed hypermetabolic infrahilar right lower lobe mass with hypermetabolic nodal metastases in the right hilum, mediastinum, right supraclavicular region, left axilla and lower left neck. 4) 10/08/2019: US  guided biopsy of the lymph nodes confirms poorly differentiated non-small cell carcinoma. Immunohistochemistry confirms adenocarcinoma of lung primary. PD-L1 and NGS later revealed a MET Exon 14 mutation and TPS of 90%.  5) 10/12/2019: Establish care with Dr. Rosaline Coma  6) 11/04/2019: Day 1 of capmatinib 400mg  BID 7) 11/09/2019: presented to Rad/Onc with a DVT in LLE. Seen in symptom management clinic and started on apixaban .  8) 12/29/2019: CT C/A/P showed interval decrease in size of mass involving the superior segment of right lower lobe and reduction in mediastinal and left supraclavicular adenopathy though some small spinal lesions were noted in the interim between the two sets of  scans 9) 01/25/2020: temporarily stopped capmatinib due to symptoms of nausea/fatigue. Restarted therapy on 01/30/2020 after brief chemo holiday.  10) 03/22/2020: CT C/A/P reveals a mixed picture, with new lung nodule and increased lymphadenopathy, however response in bone lesions. D/c capmatinib therapy 11) 04/08/2020: start of Carbo/Pem/Pem for 2nd line treatment. Cycle 1 Day 1   12) 04/29/2020: Cycle 2 Day 1 Carbo/Pem/Pem  13) 05/20/2020: Cycle 3 Day 1 Carbo/Pem/Pem  14) 06/09/2020: Cycle 4 Day 1 Carbo/Pem/Pem  15) 06/29/2020: Cycle 5 Day 1 maintenance Pem/Pem  16) 07/20/2020: Cycle 6 Day 1 maintenance Pem/Pem  17) 08/12/2020: Cycle 7 Day 1 maintenance Pem/Pem  18) 09/01/2020: Cycle 8 Day 1 maintenance Pem/Pem  19) 09/21/2020: Cycle 9 Day 1 maintenance Pem/Pem  20) 10/13/2020: Cycle 10 Day 1 maintenance Pem/Pem  21) 11/03/2020: Cycle 11 Day 1 maintenance Pem/Pem  22) 11/25/2020:  Cycle 12 Day 1 maintenance Pem/Pem 23) 12/15/2020:  Cycle 13 Day 1 maintenance Pem/Pem 24) 01/05/2021:  Cycle 14 Day 1 maintenance Pem/Pem 25) 01/26/2021:  Cycle 15 Day 1 maintenance Pem/Pem plus Bevacizumab .  02/17/2021: Cycle 16 Day 1 maintenance Pem/Pem plus Bevacizumab .  03/10/2021: Cycle 17 Day 1 maintenance Pem/Pem plus Bevacizumab .  03/31/2021: Cycle 18 Day 1 maintenance Pem/Pem  04/20/2021:  Cycle 19 Day 1 maintenance Pem/Pem  05/12/2021: Cycle 20 Day 1 maintenance Pem/Pem 06/01/2021: Cycle 21 Day 1 maintenance Pem/Pem 06/22/2021: Cycle 22 Day 1 maintenance Pem/Pem 07/13/2021: Cycle 23 Day 1 maintenance Pem/Pem 08/16/2021: Cycle 24 Day 1 maintenance Pem/Pem 09/08/2021: Cycle 25 Day 1 maintenance Pem/Pem plus Bevacizumab  09/28/2021: Cycle 26 Day 1 maintenance Pem/Pem plus Bevacizumab  10/20/2021: Cycle 27 Day 1 maintenance Pem/Pem plus Bevacizumab  11/09/2021: Cycle 28 Day 1 maintenance Pem/Pem  12/06/2021: Cycle 29 Day 1 maintenance Pem/Pem  12/27/2021: Cycle  30 Day 1 maintenance Pem/Pem  01/18/2022: Cycle 31 Day 1 maintenance  Pem/Pem  02/07/2022: Cycle 32 Day 1 maintenance Pem/Pem  02/28/2022: Cycle 33 Day 1 maintenance Pem/Pem  03/23/2022: Cycle 34 Day 1 maintenance Pem/Pem  04/13/2022: Cycle 35 Day 1 maintenance Pem/Pem  05/04/2022: Cycle 36 Day 1 maintenance Pem/Pem  05/25/2022: Cycle 37 Day 1 maintenance Pem/Pem  06/14/2022: Cycle 38 Day 1 maintenance Pem/Pem 07/05/2022: Cycle 39 Day 1 maintenance Pem/Pem 08/13/2022: Cycle 40 Day 1 maintenance Pem/Pem 09/06/2022:Cycle 41 Day 1 maintenance Pem/Pem 09/27/2022: Cycle 42 Day 1 maintenance Pem/Pem 10/19/2022: Cycle 43 Day 1 maintenance Pem/Pem 11/09/2022: Cycle 44 Day 1 maintenance Pem/Pem 11/30/2022: Cycle 45 Day 1 maintenance Pem/Pem 12/20/2022: Cycle 46 Day 1 maintenance Pem/Pem 01/10/2023: Cycle 47 Day 1 maintenance Pem/Pem 01/31/2023:Cycle 48 Day 1 maintenance Pem/Pem 02/22/2023: Cycle 49 Day 1 maintenance Pem/Pem 03/15/2023: Cycle 50 Day 1 maintenance Pem/Pem 04/04/2023: Cycle 51 Day 1 maintenance Pem/Pem 04/26/2023: Cycle 52 Day 1 maintenance Pem/Pem 05/17/2023: Cycle 53 Day 1 maintenance Pem/Pem 07/19/2023: Cycle 54 Day 1 maintenance Pembro (holding Pemetrexed ) 08/12/2023: Cycle 55 Day 1 maintenance Pem/Pem 09/02/2023: Cycle 56 Day 1 maintenance Pembro (holding Pemetrexed ) 09/23/2023: Cycle 57 Day 1 maintenance Pembro (holding Pemetrexed ) 10/15/2023: Cycle 58 Day 1 maintenance Pembro (holding Pemetrexed ) 11/27/2023:  Cycle 59 Day 1 maintenance Pembro (holding Pemetrexed ) 01/10/2024: Cycle 60 Day 1 maintenance Pembro (holding Pemetrexed ) 01/31/2024: Cycle 61 Day 1 maintenance Pembro (holding Pemetrexed )  Interval History:  Virginia [name] 68 y.o. female with medical history significant for metastatic adenocarcinoma of the lung presents for a follow up visit. The patient's last visit was on 01/10/2024.  On exam today Virginia [name] reports persistent dizziness with no improvement with dexamethasone  therapy.  So she has continued to taper the dose of dexamethasone  to 1/2 mg  per day. She would like to discontinue the steroid therapy as it isn't improving her dizziness but causing excessive hunger/weight gain along with edema. Her energy is stable and she is undergoing home PT 1-2 x per week. She is trying to walk a few steps on her own and complete some basic movements independently. She denies nausea, vomiting or bowel habit changes.She denies any fevers, chills, sweats or chest pain. She has no other complaints.  Full 10 point ROS was otherwise negative.  MEDICAL HISTORY:  Past Medical History:  Diagnosis Date   Anemia    Anxiety    Concussion 09/28/2019   DVT (deep venous thrombosis) (HCC) 2021   L leg   Dyspnea    GERD (gastroesophageal reflux disease)    Hypercholesterolemia    per pt, she does not have elevated lipids   Hypertension    met lung ca dx'd 09/2019   mets to spine, hip and brain   PONV (postoperative nausea and vomiting)    Tobacco abuse     SURGICAL HISTORY: Past Surgical History:  Procedure Laterality Date   ABDOMINAL HYSTERECTOMY     partial/ left ovaries   APPLICATION OF CRANIAL NAVIGATION N/A 12/02/2020   Procedure: APPLICATION OF CRANIAL NAVIGATION;  Surgeon: Manya Sells, MD;  Location: [facility] OR;  Service: Neurosurgery;  Laterality: N/A;   CHOLECYSTECTOMY     CRANIOTOMY N/A 12/02/2020   Procedure: Posterior fossa craniotomy for tumor resection with brainlab;  Surgeon: Manya Sells, MD;  Location: [facility] OR;  Service: Neurosurgery;  Laterality: N/A;   DILATION AND CURETTAGE OF UTERUS     IR IMAGING GUIDED PORT INSERTION  10/23/2019   IR PATIENT EVAL TECH 0-60 MINS  11/21/2021  IR PATIENT EVAL TECH 0-60 MINS  11/24/2021   IR PATIENT EVAL TECH 0-60 MINS  11/29/2021   IR PATIENT EVAL TECH 0-60 MINS  12/04/2021   IR PATIENT EVAL TECH 0-60 MINS  12/12/2021   IR PATIENT EVAL TECH 0-60 MINS  12/19/2021   IR PATIENT EVAL TECH 0-60 MINS  12/27/2021   IR PATIENT EVAL TECH 0-60 MINS  01/03/2022   IR PATIENT EVAL TECH 0-60 MINS  01/10/2022   IR PATIENT  EVAL TECH 0-60 MINS  01/18/2022   IR PATIENT EVAL TECH 0-60 MINS  02/13/2022   IR PATIENT EVAL TECH 0-60 MINS  02/20/2022   IR RADIOLOGIST EVAL & MGMT  01/23/2022   IR REMOVAL TUN ACCESS W/ PORT W/O FL MOD SED  11/17/2021   KYPHOPLASTY N/A 03/15/2020   Procedure: Thoracic Eight KYPHOPLASTY;  Surgeon: Manya Sells, MD;  Location: [facility] OR;  Service: Neurosurgery;  Laterality: N/A;  prone    LIPOMA EXCISION  2018   removed under left breast and right thigh.   PERICARDIOCENTESIS N/A 12/10/2023   Procedure: PERICARDIOCENTESIS;  Surgeon: Kyra Phy, MD;  Location: [facility] INVASIVE CV LAB;  Service: Cardiovascular;  Laterality: N/A;   TUBAL LIGATION      ALLERGIES:  is allergic to scopolamine .  MEDICATIONS:  Current Outpatient Medications  Medication Sig Dispense Refill   acetaminophen  (TYLENOL ) 325 MG tablet Take 1-2 tablets (325-650 mg total) by mouth every 4 (four) hours as needed for mild pain (pain score 1-3). (Patient taking differently: Take 500 mg by mouth every 4 (four) hours as needed for mild pain (pain score 1-3).)     albuterol  (VENTOLIN  HFA) 108 (90 Base) MCG/ACT inhaler INHALE 2 PUFFS INTO THE LUNGS EVERY 6 HOURS AS NEEDED FOR WHEEZING OR SHORTNESS OF BREATH 6.7 g 11   ALPRAZolam  (XANAX ) 0.25 MG tablet Take 1 tablet (0.25 mg total) by mouth 2 (two) times daily as needed for anxiety. 30 tablet 0   Cholecalciferol  (VITAMIN D3) 125 MCG (5000 UT) TABS Take 5,000 Units by mouth daily.     colchicine  0.6 MG tablet Take 1 tablet (0.6 mg total) by mouth daily. 90 tablet 0   Cyanocobalamin  (VITAMIN B12 PO) Take 5,000 mcg by mouth daily. Gummies     dexamethasone  (DECADRON ) 1 MG tablet Take 1 tablet (1 mg total) by mouth daily. 90 tablet 0   hydrocortisone  (ANUSOL -HC) 2.5 % rectal cream use in rectal area 2 to 3 times a day as needed 30 g PRN   levETIRAcetam  (KEPPRA ) 1000 MG tablet Take 1 tablet (1,000 mg total) by mouth 2 (two) times daily. 180 tablet 2   magnesium  oxide (MAG-OX) 400 (240 Mg) MG  tablet Take 1 tablet (400 mg total) by mouth 2 (two) times daily. 60 tablet 0   meclizine  (ANTIVERT ) 50 MG tablet Take 1 tablet (50 mg total) by mouth 3 (three) times daily. (Patient taking differently: Take 100 mg by mouth 3 (three) times daily.) 90 tablet 2   metoprolol  succinate (TOPROL -XL) 25 MG 24 hr tablet Take 1 tablet (25 mg total) by mouth daily. 90 tablet 3   Multiple Vitamin (MULTIVITAMIN WITH MINERALS) TABS tablet Take 2 tablets by mouth daily. Gummy     Nutritional Supplements (,FEEDING SUPPLEMENT, PROSOURCE PLUS) liquid Take 30 mLs by mouth 2 (two) times daily between meals. (Patient taking differently: Take 30 mLs by mouth 4 (four) times daily. Berry flavor)     OLANZapine  (ZYPREXA ) 2.5 MG tablet Take 1 tablet (2.5 mg total) by mouth  at bedtime. 90 tablet 1   ondansetron  (ZOFRAN ) 4 MG tablet TAKE 1 TABLET(4 MG) BY MOUTH EVERY 8 HOURS AS NEEDED FOR NAUSEA OR VOMITING 40 tablet 2   oxyCODONE  (OXY IR/ROXICODONE ) 5 MG immediate release tablet Take 1 tablet (5 mg total) by mouth every 4 (four) hours as needed for severe pain (pain score 7-10). 90 tablet 0   pantoprazole  (PROTONIX ) 40 MG tablet TAKE 1 TABLET(40 MG) BY MOUTH DAILY 90 tablet 3   polyethylene glycol (MIRALAX  / GLYCOLAX ) 17 g packet Take 17 g by mouth daily as needed for moderate constipation or mild constipation.     potassium chloride  SA (KLOR-CON  M) 20 MEQ tablet TAKE 1 TABLET(20 MEQ) BY MOUTH TWICE DAILY 90 tablet 1   senna-docusate (SENOKOT-S) 8.6-50 MG tablet Take 2 tablets by mouth daily as needed (Patient preference, Sennakot S for mild constipation). (Patient taking differently: Take 2 tablets by mouth daily as needed for mild constipation. 2 tabs 2 x daily)     spironolactone  (ALDACTONE ) 25 MG tablet Take 1 tablet (25 mg total) by mouth daily. 90 tablet 3   XTAMPZA  ER 13.5 MG C12A Take 1 capsule by mouth in the morning and at bedtime. 60 capsule 0   No current facility-administered medications for this visit.     REVIEW OF SYSTEMS:   Constitutional: ( - ) fevers, ( - )  chills , ( - ) night sweats Eyes: ( - ) blurriness of vision, ( - ) double vision, ( - ) watery eyes Ears, nose, mouth, throat, and face: ( - ) mucositis, ( - ) sore throat Respiratory: ( - ) cough, ( + ) dyspnea, ( - ) wheezes Cardiovascular: ( - ) palpitation, ( - ) chest discomfort, ( - ) lower extremity swelling Gastrointestinal:  ( + ) nausea, ( - ) heartburn, ( - ) change in bowel habits Skin: ( - ) abnormal skin rashes Lymphatics: ( - ) new lymphadenopathy, ( - ) easy bruising Neurological: ( - ) numbness, ( - ) tingling, ( - ) new weaknesses Behavioral/Psych: ( - ) mood change, ( - ) new changes  All other systems were reviewed with the patient and are negative.  PHYSICAL EXAMINATION: ECOG PERFORMANCE STATUS: 2 - Symptomatic, <50% confined to bed  Vitals:   01/31/24 1138  BP: (!) 128/96  Resp: 18  Temp: 97.7 F (36.5 C)  SpO2: 96%   Filed Weights   01/31/24 1138  Weight: 167 lb 8 oz (76 kg)     GENERAL: well appearing middle aged Caucasian female in NAD  SKIN: skin color, texture, turgor are normal, no rashes or significant lesions EYES: conjunctiva are pink and non-injected, sclera clear LUNGS: wheezing at lung bases. clear to auscultation and percussion with normal breathing effort HEART: regular rate & rhythm and no murmurs and bilateral trace pitting lower extremity edema Musculoskeletal: no cyanosis of digits and no clubbing PSYCH: alert & oriented x 3, fluent speech NEURO: no focal motor/sensory deficits  LABORATORY DATA:  I have reviewed the data as listed    Latest Ref Rng & Units 01/31/2024   11:21 AM 01/10/2024   12:18 PM 12/20/2023    1:01 PM  CBC  WBC 4.0 - 10.5 K/uL 8.0  9.0  7.1   Hemoglobin 12.0 - 15.0 g/dL 40.9  81.1  91.4   Hematocrit 36.0 - 46.0 % 38.7  37.0  37.5   Platelets 150 - 400 K/uL 259  194  369  Latest Ref Rng & Units 01/31/2024   11:21 AM 01/10/2024   12:18 PM  12/20/2023    1:01 PM  CMP  Glucose 70 - 99 mg/dL 85  75  161   BUN 8 - 23 mg/dL 42  37  40   Creatinine 0.44 - 1.00 mg/dL 0.96  0.45  4.09   Sodium 135 - 145 mmol/L 142  140  143   Potassium 3.5 - 5.1 mmol/L 3.9  4.9  4.0   Chloride 98 - 111 mmol/L 108  106  108   CO2 22 - 32 mmol/L 28  30  28    Calcium  8.9 - 10.3 mg/dL 9.4  9.3  9.0   Total Protein 6.5 - 8.1 g/dL 6.5  6.0  6.5   Total Bilirubin 0.0 - 1.2 mg/dL 0.3  0.3  0.3   Alkaline Phos 38 - 126 U/L 39  37  43   AST 15 - 41 U/L 12  13  11    ALT 0 - 44 U/L 12  17  15       RADIOGRAPHIC STUDIES: I personally have viewed the radiographic studies below: CT CHEST ABDOMEN PELVIS W CONTRAST Result Date: 01/28/2024 CLINICAL DATA:  Metastatic lung cancer, assess treatment response, ongoing immunotherapy, XRT complete * Tracking Code: BO * EXAM: CT CHEST, ABDOMEN, AND PELVIS WITH CONTRAST TECHNIQUE: Multidetector CT imaging of the chest, abdomen and pelvis was performed following the standard protocol during bolus administration of intravenous contrast. RADIATION DOSE REDUCTION: This exam was performed according to the departmental dose-optimization program which includes automated exposure control, adjustment of the mA and/or kV according to patient size and/or use of iterative reconstruction technique. CONTRAST:  OMNIPAQUE  IOHEXOL  300 MG/ML  SOLN COMPARISON:  11/01/2023 FINDINGS: CT CHEST FINDINGS Cardiovascular: Aortic atherosclerosis. Mild cardiomegaly. Left coronary artery calcifications. Unchanged, large pericardial effusion. Mediastinum/Nodes: No enlarged mediastinal, hilar, or axillary lymph nodes. Thyroid  gland, trachea, and esophagus demonstrate no significant findings. Lungs/Pleura: Unchanged post treatment appearance of the chest with dependent bibasilar bandlike scarring and atelectasis (series 7, image 86). Unchanged irregular nodule of the infrahilar right lower lobe measuring 1.0 x 0.4 cm (series 7, image 78). Background of  extensive, fine nodularity throughout the lungs, unchanged, individual nodules no greater than 0.3 cm, for example in the right apex (series 7, image 26). Scattered additional ground-glass opacities, likewise unchanged. No pleural effusion or pneumothorax. Musculoskeletal: No chest wall abnormality. Multiple subacute fractures of the anterior ribs bilaterally. Subtle, nondisplaced sclerotic fracture of the mid sternal body (series 6, image 115). Vertebral cement augmentation of T8 (series 6, image 113). CT ABDOMEN PELVIS FINDINGS Hepatobiliary: No focal liver abnormality is seen. Status post cholecystectomy. No biliary dilatation. Pancreas: Unremarkable. No pancreatic ductal dilatation or surrounding inflammatory changes. Spleen: Normal in size without significant abnormality. Adrenals/Urinary Tract: Adrenal glands are unremarkable. Kidneys are normal, without renal calculi, solid lesion, or hydronephrosis. Bladder is unremarkable. Stomach/Bowel: Stomach is within normal limits. Appendix appears normal. No evidence of bowel wall thickening, distention, or inflammatory changes. Vascular/Lymphatic: Aortic atherosclerosis. No enlarged abdominal or pelvic lymph nodes. Reproductive: Status post hysterectomy. Other: No abdominal wall hernia or abnormality. No ascites. Musculoskeletal: No acute osseous findings. IMPRESSION: 1. Unchanged post treatment appearance of the chest with dependent bibasilar bandlike scarring and atelectasis. Unchanged irregular nodule of the infrahilar right lower lobe. 2. Background of extensive, fine nodularity throughout the lungs, unchanged, individual nodules no greater than 0.3 cm. Scattered additional ground-glass opacities, likewise unchanged. Attention on follow-up. 3. No evidence of  lymphadenopathy or metastatic disease in the abdomen or pelvis. 4. Unchanged, large pericardial effusion. 5. Subacute fractures of the sternum and anterior ribs. 6. Coronary artery disease. Aortic  Atherosclerosis (ICD10-I70.0). Electronically Signed   By: Fredricka Jenny M.D.   On: 01/28/2024 07:33     ASSESSMENT & PLAN ALYCIA COOPERWOOD is a 68 y.o. female with medical history significant for metastatic adenocarcinoma of the lung presents for a follow up visit. 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   Previously we discussed Carboplatin /Pembrolizumab /Pemetrexed  and the expected side effect from these medications.  We discussed the possible side effects of immunotherapy including colitis, hepatitis, dermatitis, and pneumonitis.  Additionally we discussed the side effects of carboplatin  which include suppression of blood counts, nephrotoxicity, and nausea.  Additionally we discussed pemetrexed  which can also have effects on counts and would require supplementation with vitamin B12 and folic acid .  She voiced her understanding of these side effects and treatment plans moving forward.  I also noted that we would be able to drop the chemotherapy agents that she had intolerance to continue with pembrolizumab  alone given her high TPS score.  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 Due to worsening dizziness, she resumed this from Cycle 25-27.  PLAN: --Due for Cycle 61, Day 1 of maintenance single agent Pembrolizumab  today.  --Labs from today were reviewed and adequate for treatment.  Labs today show WBC 8.0, hemoglobin 13.4, platelet 259, creatinine overall stable and LFTs in range. --Last CT scan in 01/27/2024 showed no evidence of progressive disease. Next scan due around August 2025.  --RTC q 3 weeks for labs, follow up visit prior to Cycle 62.  #Brain Metastasis --appreciate the assistance of Dr. Mark Sil in treating her brain metastasis and  symptoms. --radiation therapy to the brain completed on 11/16/2019.  --MRI brain on 04/29/2020 showed evidence of small brain bleed. Eliquis  therapy was discontinued.  --MRI on 11/04/2020 showed concern for progression of intracranial disease.  --On 12/02/2020, patient underwent craniotomy, resection of progressive cerebellar mass by Dr. Nigel Bart. 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  --MRI on 12/23/2020 showed evidence of radiation necrosis. Dr. Mark Sil evaluated the patient on 01/02/2021 with recommendations to add IV Bevacizumab  10 mg/kg q 3 weeks ( started 01/26/2021). Completed a full course of this therapy. --Patient was evaluated by Dr. Mark Sil on 08/29/2021 and recommended to resume Bevacizumab  due to worsening dizziness.  Which was given from Cycle 25-27.  Plan: --continue to follow with Dr. Mark Sil  --Most recent MRI from 12/18/2023 showed evidence of intracranial bleeding.  Eliquis  held indefinitely.  --Next MRI brain scheduled for 03/19/2024.   #Nausea/Vomiting, worsening --Regimen includes Zofran  4-8mg  q8h PRN and compazine  10mg  q6H for breakthrough PRN, Olanzpaine 2.5 mg PO QHS to help with nausea.  She is currently using Phenergan  for breakthrough. --continue to monitor   #Hypokalemia --likely 2/2 to decreased PO intake, hypovolemia, and BP medications --Potassium level 3.9 today.   --Currently on potassium chloride  tablets, 40 mEq PO potassium in AM and 20mEq in PM.   #Pain Control, stable --continue oxycodone  5mg  q4H PRN. Can increase dose to 10mg  q6H if pain is severe. She is taking approximately 3-4 pills per day.  --continue Xtampza  13.5 mg ER BID for long acting support.  --continue to take  with Senokot to prevent opioid induced constipation. 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    # Hypertension: --Continue Norvasc   to 10 mg daily.  --continue to monitor at home and in clinic.    No orders of the defined types were placed in this encounter.   All questions were answered. The patient knows to call the clinic with any problems, questions or concerns.  I have spent a total of 30 minutes minutes of face-to-face and non-face-to-face time, preparing to see the patient, performing a medically appropriate examination, counseling and educating the patient,  documenting clinical information in the electronic health record, and care coordination.   Wyline Hearing [name] Dept of Hematology and Oncology [facility] at [facility] Phone: [phone]    02/02/2024 11:29 PM   Literature Support:  Almarie Jacob, Rodrguez-Abreu D, Gadgeel S, Esteban E, Felip E, [place] F, Domine M, Clingan P, Hochmair MJ, [place], [place], Bischoff HG, Peled N, Grossi F, Jennens RR, Reck M, [place], [place], [place], Rubio-Viqueira B, Novello S, Kurata T, Gray JE, Vida J, Wei Z, Yang J, Raftopoulos H, [place], [place] [employer]; [id number] Investigators. Pembrolizumab  plus Chemotherapy in Metastatic Non-Small-Cell Lung Cancer. Mel Spine Med. 2018 May 31;378(22):2078-2092.  --Median progression-free survival was 8.8 months (95% CI, 7.6 to 9.2) in the pembrolizumab -combination group and 4.9 months (95% CI, 4.7 to 5.5) in the placebo-combination group (hazard ratio for disease progression or death, 0.52; 95% CI, 0.43 to 0.64; P<0.001). Adverse events of grade 3 or higher occurred in 67.2% of the patients in the pembrolizumab -combination group and in 65.8% of those in the placebo-combination group.  Jasper Messenger L. Efficacy of pemetrexed -based regimens in advanced non-small cell lung cancer patients with activating epidermal growth factor receptor mutations after tyrosine kinase inhibitor failure: a systematic review. Onco Targets Ther. 2018;11:2121-2129.   --The weighted median PFS, median OS, and ORR for  patients treated with pem regimens were 5.09 months, 15.91 months, and 30.19%, respectively. Our systematic review results showed a favorable efficacy profile of pem regimens in NSCLC patients with EGFR mutation after EGFR-TKI failure.

## 2024-02-04 MED ORDER — FUROSEMIDE 20 MG PO TABS
20.0000 mg | ORAL_TABLET | Freq: Every day | ORAL | Status: DC | PRN
Start: 2024-02-04 — End: 2024-02-17

## 2024-02-04 NOTE — Addendum Note (Signed)
 Addended by: Jamal Mays on: 02/04/2024 04:32 PM   Modules accepted: Orders

## 2024-02-06 ENCOUNTER — Other Ambulatory Visit: Payer: Medicare Other

## 2024-02-10 ENCOUNTER — Encounter: Payer: Self-pay | Admitting: Internal Medicine

## 2024-02-13 ENCOUNTER — Ambulatory Visit: Payer: Medicare Other | Admitting: Internal Medicine

## 2024-02-16 NOTE — Progress Notes (Unsigned)
 Virtual Visit via Telephone Note   Because of DEETTE REVAK co-morbid illnesses, she is at least at moderate risk for complications without adequate follow up.  This format is felt to be most appropriate for this patient at this time.  Due to technical limitations with video connection (technology), today's appointment will be conducted as an audio only telehealth visit, and Virginia [name] verbally agreed to proceed in this manner.   All issues noted in this document were discussed and addressed.  No physical exam could be performed with this format.  Evaluation Performed:  Preoperative cardiovascular risk assessment _____________   Date:  02/16/2024   Patient ID:  Virginia [name], DOB Sep 16, 1955, MRN [id number] Patient Location:  Home Provider location:   Office  Primary Care Provider:  Sylvan Evener, MD Primary Cardiologist:  None  Chief Complaint / Patient Profile  Virginia [name] is a 68 y.o. 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 female who presents via audio/video conferencing for a telehealth visit today.  Pt was last seen via televisit on 01/13/2024 for posthospital follow-up.  During her visit she reported ongoing dizziness with symptoms primarily associated with recent cerebral hematoma.  She was being evaluated by PT and OT and noted some increased swelling in her lower extremities with inability to lay flat past 15 degrees.  She was advised to continue as needed Lasix  20 mg along with potassium 20 mEq and spironolactone  25 mg daily.  She was seen in follow-up on 01/31/2024 by oncology and reported no improvement to dizziness with dexamethasone  therapy.  She reported  stable energy control pain level.          Past Medical History    Past Medical History:  Diagnosis Date   Anemia    Anxiety    Concussion 09/28/2019   DVT (deep venous thrombosis) (HCC) 2021   L leg   Dyspnea    GERD (gastroesophageal reflux disease)    Hypercholesterolemia    per pt, she does not have elevated lipids   Hypertension    met lung ca dx'd 09/2019   mets to spine, hip and brain   PONV (postoperative nausea and vomiting)    Tobacco abuse    Past Surgical History:  Procedure Laterality Date   ABDOMINAL HYSTERECTOMY     partial/ left ovaries   APPLICATION OF CRANIAL NAVIGATION N/A 12/02/2020   Procedure: APPLICATION OF CRANIAL NAVIGATION;  Surgeon: Manya Sells, MD;  Location: [facility] OR;  Service: Neurosurgery;  Laterality: N/A;   CHOLECYSTECTOMY     CRANIOTOMY N/A 12/02/2020   Procedure: Posterior fossa craniotomy for tumor resection with brainlab;  Surgeon: Manya Sells, MD;  Location: [facility] OR;  Service: Neurosurgery;  Laterality: N/A;   DILATION AND CURETTAGE OF UTERUS     IR IMAGING GUIDED PORT INSERTION  10/23/2019   IR PATIENT EVAL TECH 0-60 MINS  11/21/2021   IR PATIENT EVAL TECH 0-60 MINS  11/24/2021   IR PATIENT EVAL TECH 0-60 MINS  11/29/2021   IR PATIENT EVAL TECH 0-60 MINS  12/04/2021   IR PATIENT EVAL TECH 0-60 MINS  12/12/2021   IR  PATIENT EVAL TECH 0-60 MINS  12/19/2021   IR PATIENT EVAL TECH 0-60 MINS  12/27/2021   IR PATIENT EVAL TECH 0-60 MINS  01/03/2022   IR PATIENT EVAL TECH 0-60 MINS  01/10/2022   IR PATIENT EVAL TECH 0-60 MINS  01/18/2022   IR PATIENT EVAL TECH 0-60 MINS  02/13/2022   IR PATIENT EVAL TECH 0-60 MINS  02/20/2022   IR RADIOLOGIST EVAL & MGMT  01/23/2022   IR REMOVAL TUN ACCESS W/ PORT W/O FL MOD SED  11/17/2021   KYPHOPLASTY N/A 03/15/2020   Procedure: Thoracic Eight KYPHOPLASTY;  Surgeon: Manya Sells, MD;  Location: [facility] OR;  Service: Neurosurgery;  Laterality: N/A;  prone    LIPOMA EXCISION  2018   removed under left breast and right  thigh.   PERICARDIOCENTESIS N/A 12/10/2023   Procedure: PERICARDIOCENTESIS;  Surgeon: Kyra Phy, MD;  Location: [facility] INVASIVE CV LAB;  Service: Cardiovascular;  Laterality: N/A;   TUBAL LIGATION      Allergies  Allergies  Allergen Reactions   Scopolamine  Rash    Home Medications    Prior to Admission medications   Medication Sig Start Date End Date Taking? Authorizing Provider  acetaminophen  (TYLENOL ) 325 MG tablet Take 1-2 tablets (325-650 mg total) by mouth every 4 (four) hours as needed for mild pain (pain score 1-3). Patient taking differently: Take 500 mg by mouth every 4 (four) hours as needed for mild pain (pain score 1-3). 07/01/23   Setzer, Sandra J, [name]  albuterol  (VENTOLIN  HFA) 108 (90 Base) MCG/ACT inhaler INHALE 2 PUFFS INTO THE LUNGS EVERY 6 HOURS AS NEEDED FOR WHEEZING OR SHORTNESS OF BREATH 12/09/23   Sylvan Evener, MD  ALPRAZolam  (XANAX ) 0.25 MG tablet Take 1 tablet (0.25 mg total) by mouth 2 (two) times daily as needed for anxiety. 01/28/24   Pickenpack-Cousar, Giles Labrum, NP  Cholecalciferol  (VITAMIN D3) 125 MCG (5000 UT) TABS Take 5,000 Units by mouth daily.    [provider]  colchicine  0.6 MG tablet Take 1 tablet (0.6 mg total) by mouth daily. 12/12/23   Knox Perl, MD  Cyanocobalamin  (VITAMIN B12 PO) Take 5,000 mcg by mouth daily. Gummies    [provider]  dexamethasone  (DECADRON ) 1 MG tablet Take 1 tablet (1 mg total) by mouth daily. 01/27/24   Vaslow, Zachary K, MD  furosemide  (LASIX ) 20 MG tablet Take 1 tablet (20 mg total) by mouth daily as needed. 02/04/24 05/04/24  Gerald Kitty., NP  hydrocortisone  (ANUSOL -HC) 2.5 % rectal cream use in rectal area 2 to 3 times a day as needed 05/24/23   Sylvan Evener, MD  levETIRAcetam  (KEPPRA ) 1000 MG tablet Take 1 tablet (1,000 mg total) by mouth 2 (two) times daily. 12/09/23   Vaslow, Zachary K, MD  magnesium  oxide (MAG-OX) 400 (240 Mg) MG tablet Take 1 tablet (400 mg total) by mouth 2 (two) times  daily. 07/01/23   Setzer, Sandra J, [name]  meclizine  (ANTIVERT ) 50 MG tablet Take 1 tablet (50 mg total) by mouth 3 (three) times daily. Patient taking differently: Take 100 mg by mouth 3 (three) times daily. 07/29/23   Vaslow, Zachary K, MD  metoprolol  succinate (TOPROL -XL) 25 MG 24 hr tablet Take 1 tablet (25 mg total) by mouth daily. 07/26/23   Tania Familia, NP  Multiple Vitamin (MULTIVITAMIN WITH MINERALS) TABS tablet Take 2 tablets by mouth daily. Gummy    [provider]  Nutritional Supplements (,FEEDING SUPPLEMENT, PROSOURCE PLUS) liquid Take 30 mLs by  mouth 2 (two) times daily between meals. Patient taking differently: Take 30 mLs by mouth 4 (four) times daily. Berry flavor 07/01/23   Setzer, Hamp Levine, [name]  OLANZapine  (ZYPREXA ) 2.5 MG tablet Take 1 tablet (2.5 mg total) by mouth at bedtime. 07/29/23   Ander Bame, MD  ondansetron  (ZOFRAN ) 4 MG tablet TAKE 1 TABLET(4 MG) BY MOUTH EVERY 8 HOURS AS NEEDED FOR NAUSEA OR VOMITING 01/30/23   Dorsey, John T IV, MD  oxyCODONE  (OXY IR/ROXICODONE ) 5 MG immediate release tablet Take 1 tablet (5 mg total) by mouth every 4 (four) hours as needed for severe pain (pain score 7-10). 01/28/24   Pickenpack-Cousar, Athena N, NP  pantoprazole  (PROTONIX ) 40 MG tablet TAKE 1 TABLET(40 MG) BY MOUTH DAILY 09/12/23   Sylvan Evener, MD  polyethylene glycol (MIRALAX  / GLYCOLAX ) 17 g packet Take 17 g by mouth daily as needed for moderate constipation or mild constipation.    [provider]  potassium chloride  SA (KLOR-CON  M) 20 MEQ tablet TAKE 1 TABLET(20 MEQ) BY MOUTH TWICE DAILY 01/02/24   Dorsey, John T IV, MD  senna-docusate (SENOKOT-S) 8.6-50 MG tablet Take 2 tablets by mouth daily as needed (Patient preference, Sennakot S for mild constipation). Patient taking differently: Take 2 tablets by mouth daily as needed for mild constipation. 2 tabs 2 x daily 07/01/23   Setzer, Sandra J, [name]  spironolactone  (ALDACTONE ) 25 MG tablet Take 1  tablet (25 mg total) by mouth daily. 07/26/23   Tania Familia, NP  XTAMPZA  ER 13.5 MG C12A Take 1 capsule by mouth in the morning and at bedtime. 01/23/24   Pickenpack-Cousar, Giles Labrum, NP    Physical Exam    Vital Signs:  Virginia [name] does not have vital signs available for review today.***  Given telephonic nature of communication, physical exam is limited. AAOx3. NAD. Normal affect.  Speech and respirations are unlabored.  Accessory Clinical Findings    None  Assessment & Plan    1.   Pericardial effusion:   2.HFrEF/NICM: - 2D echo was completed last on 11/19/2023 noting EF of 55% and large pericardial effusion requiring drainage  3.Essential hypertension: - Blood pressure today was 128/97 checked by home cuff - Continue Toprol -XL 25 mg and spironolactone  25 mg   4.  Hyperlipidemia: - No lipid-lowering therapy due to stage IV lung cancer.   5. NSCLC with Mets: - Continue current treatment per oncology       Time:   Today, I have spent *** minutes with the patient with telehealth technology discussing medical history, symptoms, and management plan.     Francene Ing, Retha Cast, NP  02/16/2024, 1:12 PM

## 2024-02-17 ENCOUNTER — Encounter: Payer: Self-pay | Admitting: Nurse Practitioner

## 2024-02-17 ENCOUNTER — Ambulatory Visit: Attending: Nurse Practitioner | Admitting: Nurse Practitioner

## 2024-02-17 VITALS — BP 132/83 | HR 70 | Ht 62.0 in | Wt 171.0 lb

## 2024-02-17 DIAGNOSIS — C7951 Secondary malignant neoplasm of bone: Secondary | ICD-10-CM

## 2024-02-17 DIAGNOSIS — I469 Cardiac arrest, cause unspecified: Secondary | ICD-10-CM | POA: Diagnosis present

## 2024-02-17 DIAGNOSIS — I3139 Other pericardial effusion (noninflammatory): Secondary | ICD-10-CM

## 2024-02-17 DIAGNOSIS — C3490 Malignant neoplasm of unspecified part of unspecified bronchus or lung: Secondary | ICD-10-CM

## 2024-02-17 DIAGNOSIS — I429 Cardiomyopathy, unspecified: Secondary | ICD-10-CM | POA: Diagnosis present

## 2024-02-17 DIAGNOSIS — I10 Essential (primary) hypertension: Secondary | ICD-10-CM | POA: Diagnosis present

## 2024-02-17 DIAGNOSIS — I4901 Ventricular fibrillation: Secondary | ICD-10-CM | POA: Insufficient documentation

## 2024-02-17 DIAGNOSIS — E785 Hyperlipidemia, unspecified: Secondary | ICD-10-CM

## 2024-02-17 DIAGNOSIS — Z8679 Personal history of other diseases of the circulatory system: Secondary | ICD-10-CM

## 2024-02-17 MED ORDER — FUROSEMIDE 20 MG PO TABS: 20.0000 mg | ORAL_TABLET | ORAL | Status: DC

## 2024-02-17 NOTE — Patient Instructions (Signed)
 Medication Instructions:  DECREASE Lasix  to 2 times a week on Tuesdays and Thursdays.  *If you need a refill on your cardiac medications before your next appointment, please call your pharmacy*  Lab Work: None ordered If you have labs (blood work) drawn today and your tests are completely normal, you will receive your results only by: MyChart Message (if you have MyChart) OR A paper copy in the mail If you have any lab test that is abnormal or we need to change your treatment, we will call you to review the results.  Testing/Procedures: None ordered  Follow-Up: At [facility], you and your health needs are our priority.  As part of our continuing mission to provide you with exceptional heart care, our providers are all part of one team.  This team includes your primary Cardiologist (physician) and Advanced Practice Providers or APPs (Physician Assistants and Nurse Practitioners) who all work together to provide you with the care you need, when you need it.  Your next appointment:   As Needed   Provider:   APP or available provider     We recommend signing up for the patient portal called "MyChart".  Sign up information is provided on this After Visit Summary.  MyChart is used to connect with patients for Virtual Visits (Telemedicine).  Patients are able to view lab/test results, encounter notes, upcoming appointments, etc.  Non-urgent messages can be sent to your provider as well.   To learn more about what you can do with MyChart, go to [email or web address].   Other Instructions

## 2024-02-20 ENCOUNTER — Other Ambulatory Visit: Payer: Self-pay

## 2024-02-20 ENCOUNTER — Other Ambulatory Visit: Payer: Self-pay | Admitting: Nurse Practitioner

## 2024-02-20 DIAGNOSIS — Z515 Encounter for palliative care: Secondary | ICD-10-CM

## 2024-02-20 DIAGNOSIS — G893 Neoplasm related pain (acute) (chronic): Secondary | ICD-10-CM

## 2024-02-20 DIAGNOSIS — C3490 Malignant neoplasm of unspecified part of unspecified bronchus or lung: Secondary | ICD-10-CM

## 2024-02-20 MED ORDER — XTAMPZA ER 13.5 MG PO C12A: 1.0000 | EXTENDED_RELEASE_CAPSULE | Freq: Two times a day (BID) | ORAL | 0 refills | Status: DC

## 2024-02-20 MED ORDER — OXYCODONE HCL 5 MG PO TABS: 5.0000 mg | ORAL_TABLET | ORAL | 0 refills | Status: DC | PRN

## 2024-02-20 NOTE — Telephone Encounter (Signed)
 error

## 2024-02-21 ENCOUNTER — Inpatient Hospital Stay: Attending: Hematology and Oncology

## 2024-02-21 ENCOUNTER — Inpatient Hospital Stay

## 2024-02-21 ENCOUNTER — Inpatient Hospital Stay (HOSPITAL_BASED_OUTPATIENT_CLINIC_OR_DEPARTMENT_OTHER): Admitting: Hematology and Oncology

## 2024-02-21 VITALS — BP 125/73 | HR 78 | Temp 98.0°F | Resp 14 | Wt 181.0 lb

## 2024-02-21 DIAGNOSIS — C3431 Malignant neoplasm of lower lobe, right bronchus or lung: Secondary | ICD-10-CM | POA: Insufficient documentation

## 2024-02-21 DIAGNOSIS — Z86718 Personal history of other venous thrombosis and embolism: Secondary | ICD-10-CM | POA: Insufficient documentation

## 2024-02-21 DIAGNOSIS — R918 Other nonspecific abnormal finding of lung field: Secondary | ICD-10-CM

## 2024-02-21 DIAGNOSIS — Z8673 Personal history of transient ischemic attack (TIA), and cerebral infarction without residual deficits: Secondary | ICD-10-CM | POA: Diagnosis not present

## 2024-02-21 DIAGNOSIS — E876 Hypokalemia: Secondary | ICD-10-CM | POA: Insufficient documentation

## 2024-02-21 DIAGNOSIS — K5900 Constipation, unspecified: Secondary | ICD-10-CM | POA: Diagnosis not present

## 2024-02-21 DIAGNOSIS — Z5112 Encounter for antineoplastic immunotherapy: Secondary | ICD-10-CM | POA: Insufficient documentation

## 2024-02-21 DIAGNOSIS — Z79620 Long term (current) use of immunosuppressive biologic: Secondary | ICD-10-CM | POA: Diagnosis not present

## 2024-02-21 DIAGNOSIS — R42 Dizziness and giddiness: Secondary | ICD-10-CM | POA: Diagnosis not present

## 2024-02-21 DIAGNOSIS — Z9049 Acquired absence of other specified parts of digestive tract: Secondary | ICD-10-CM | POA: Insufficient documentation

## 2024-02-21 DIAGNOSIS — C7931 Secondary malignant neoplasm of brain: Secondary | ICD-10-CM

## 2024-02-21 DIAGNOSIS — Z7901 Long term (current) use of anticoagulants: Secondary | ICD-10-CM | POA: Insufficient documentation

## 2024-02-21 DIAGNOSIS — Z79899 Other long term (current) drug therapy: Secondary | ICD-10-CM | POA: Diagnosis not present

## 2024-02-21 DIAGNOSIS — Z87891 Personal history of nicotine dependence: Secondary | ICD-10-CM | POA: Diagnosis not present

## 2024-02-21 DIAGNOSIS — Z90710 Acquired absence of both cervix and uterus: Secondary | ICD-10-CM | POA: Insufficient documentation

## 2024-02-21 DIAGNOSIS — I3139 Other pericardial effusion (noninflammatory): Secondary | ICD-10-CM | POA: Diagnosis not present

## 2024-02-21 DIAGNOSIS — I700 Atherosclerosis of aorta: Secondary | ICD-10-CM | POA: Diagnosis not present

## 2024-02-21 DIAGNOSIS — G8929 Other chronic pain: Secondary | ICD-10-CM | POA: Insufficient documentation

## 2024-02-21 LAB — CMP (CANCER CENTER ONLY)
ALT: 12 U/L (ref 0–44)
AST: 12 U/L — ABNORMAL LOW (ref 15–41)
Albumin: 3.5 g/dL (ref 3.5–5.0)
Alkaline Phosphatase: 39 U/L (ref 38–126)
Anion gap: 6 (ref 5–15)
BUN: 38 mg/dL — ABNORMAL HIGH (ref 8–23)
CO2: 30 mmol/L (ref 22–32)
Calcium: 9.1 mg/dL (ref 8.9–10.3)
Chloride: 106 mmol/L (ref 98–111)
Creatinine: 0.98 mg/dL (ref 0.44–1.00)
GFR, Estimated: >60 mL/min (ref >60)
Glucose, Bld: 111 mg/dL — ABNORMAL HIGH (ref 70–99)
Potassium: 4.1 mmol/L (ref 3.5–5.1)
Sodium: 142 mmol/L (ref 135–145)
Total Bilirubin: 0.4 mg/dL (ref 0.0–1.2)
Total Protein: 6.2 g/dL — ABNORMAL LOW (ref 6.5–8.1)

## 2024-02-21 LAB — TSH: TSH: 1.57 u[IU]/mL (ref 0.350–4.500)

## 2024-02-21 LAB — CBC WITH DIFFERENTIAL (CANCER CENTER ONLY)
Abs Immature Granulocytes: 0.17 10*3/uL — ABNORMAL HIGH (ref 0.00–0.07)
Basophils Absolute: 0.1 10*3/uL (ref 0.0–0.1)
Basophils Relative: 1 %
Eosinophils Absolute: 0.1 10*3/uL (ref 0.0–0.5)
Eosinophils Relative: 1 %
HCT: 39.2 % (ref 36.0–46.0)
Hemoglobin: 13.1 g/dL (ref 12.0–15.0)
Immature Granulocytes: 2 %
Lymphocytes Relative: 20 %
Lymphs Abs: 2.1 10*3/uL (ref 0.7–4.0)
MCH: 31.9 pg (ref 26.0–34.0)
MCHC: 33.4 g/dL (ref 30.0–36.0)
MCV: 95.4 fL (ref 80.0–100.0)
Monocytes Absolute: 1 10*3/uL (ref 0.1–1.0)
Monocytes Relative: 10 %
Neutro Abs: 6.8 10*3/uL (ref 1.7–7.7)
Neutrophils Relative %: 66 %
Platelet Count: 225 10*3/uL (ref 150–400)
RBC: 4.11 MIL/uL (ref 3.87–5.11)
RDW: 13 % (ref 11.5–15.5)
WBC Count: 10.3 10*3/uL (ref 4.0–10.5)
nRBC: 0 % (ref 0.0–0.2)

## 2024-02-21 MED ORDER — SODIUM CHLORIDE 0.9 % IV SOLN
200.0000 mg | Freq: Once | INTRAVENOUS | Status: AC
  Administered 2024-02-21: 200 mg via INTRAVENOUS
  Filled 2024-02-21: qty 200

## 2024-02-21 MED ORDER — SODIUM CHLORIDE 0.9 % IV SOLN: Freq: Once | INTRAVENOUS | Status: AC

## 2024-02-21 MED ORDER — PROCHLORPERAZINE MALEATE 10 MG PO TABS
10.0000 mg | ORAL_TABLET | Freq: Once | ORAL | Status: AC
  Administered 2024-02-21: 10 mg via ORAL
  Filled 2024-02-21: qty 1

## 2024-02-21 NOTE — Progress Notes (Unsigned)
 [facility] [phone]   [phone]  PROGRESS NOTE  Patient Care Team: Sylvan Evener, MD as PCP - General (Internal Medicine) Ander Bame, MD as PCP - Hematology/Oncology (Hematology and Oncology) Jaye Mettle, RN as Oncology Nurse Navigator Rafe Bunde, DO as Consulting Physician [facility] and Palliative Medicine) Lbcardiology, Zola Hint, MD as Rounding Team (Cardiology) Pickenpack-Cousar, Giles Labrum, NP as Nurse Practitioner (Hospice and Palliative Medicine)  Hematological/Oncological History # 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 CT of the head showed showed concerning for metastatic disease involving the left cerebellum and right occipital lobe.  2) 09/30/2019: MRI brain confirms mass within the left cerebellum measures 1.6 x 1.3 x 1.5 cm as well as at least 8 metastatic lesions within the supratentorial and infratentorial brain as outlined 3) 10/01/2019: PET CT scan revealed hypermetabolic infrahilar right lower lobe mass with hypermetabolic nodal metastases in the right hilum, mediastinum, right supraclavicular region, left axilla and lower left neck. 4) 10/08/2019: US  guided biopsy of the lymph nodes confirms poorly differentiated non-small cell carcinoma. Immunohistochemistry confirms adenocarcinoma of lung primary. PD-L1 and NGS later revealed a MET Exon 14 mutation and TPS of 90%.  5) 10/12/2019: Establish care with Dr. Rosaline Coma  6) 11/04/2019: Day 1 of capmatinib 400mg  BID 7) 11/09/2019: presented to Rad/Onc with a DVT in LLE. Seen in symptom management clinic and started on apixaban .  8) 12/29/2019: CT C/A/P showed interval decrease in size of mass involving the superior segment of right lower lobe and reduction in mediastinal and left supraclavicular adenopathy though some small spinal lesions were noted in the interim between the two sets of  scans 9) 01/25/2020: temporarily stopped capmatinib due to symptoms of nausea/fatigue. Restarted therapy on 01/30/2020 after brief chemo holiday.  10) 03/22/2020: CT C/A/P reveals a mixed picture, with new lung nodule and increased lymphadenopathy, however response in bone lesions. D/c capmatinib therapy 11) 04/08/2020: start of Carbo/Pem/Pem for 2nd line treatment. Cycle 1 Day 1   12) 04/29/2020: Cycle 2 Day 1 Carbo/Pem/Pem  13) 05/20/2020: Cycle 3 Day 1 Carbo/Pem/Pem  14) 06/09/2020: Cycle 4 Day 1 Carbo/Pem/Pem  15) 06/29/2020: Cycle 5 Day 1 maintenance Pem/Pem  16) 07/20/2020: Cycle 6 Day 1 maintenance Pem/Pem  17) 08/12/2020: Cycle 7 Day 1 maintenance Pem/Pem  18) 09/01/2020: Cycle 8 Day 1 maintenance Pem/Pem  19) 09/21/2020: Cycle 9 Day 1 maintenance Pem/Pem  20) 10/13/2020: Cycle 10 Day 1 maintenance Pem/Pem  21) 11/03/2020: Cycle 11 Day 1 maintenance Pem/Pem  22) 11/25/2020:  Cycle 12 Day 1 maintenance Pem/Pem 23) 12/15/2020:  Cycle 13 Day 1 maintenance Pem/Pem 24) 01/05/2021:  Cycle 14 Day 1 maintenance Pem/Pem 25) 01/26/2021:  Cycle 15 Day 1 maintenance Pem/Pem plus Bevacizumab .  02/17/2021: Cycle 16 Day 1 maintenance Pem/Pem plus Bevacizumab .  03/10/2021: Cycle 17 Day 1 maintenance Pem/Pem plus Bevacizumab .  03/31/2021: Cycle 18 Day 1 maintenance Pem/Pem  04/20/2021:  Cycle 19 Day 1 maintenance Pem/Pem  05/12/2021: Cycle 20 Day 1 maintenance Pem/Pem 06/01/2021: Cycle 21 Day 1 maintenance Pem/Pem 06/22/2021: Cycle 22 Day 1 maintenance Pem/Pem 07/13/2021: Cycle 23 Day 1 maintenance Pem/Pem 08/16/2021: Cycle 24 Day 1 maintenance Pem/Pem 09/08/2021: Cycle 25 Day 1 maintenance Pem/Pem plus Bevacizumab  09/28/2021: Cycle 26 Day 1 maintenance Pem/Pem plus Bevacizumab  10/20/2021: Cycle 27 Day 1 maintenance Pem/Pem plus Bevacizumab  11/09/2021: Cycle 28 Day 1 maintenance Pem/Pem  12/06/2021: Cycle 29 Day 1 maintenance Pem/Pem  12/27/2021: Cycle  30 Day 1 maintenance Pem/Pem  01/18/2022: Cycle 31 Day 1 maintenance  Pem/Pem  02/07/2022: Cycle 32 Day 1 maintenance Pem/Pem  02/28/2022: Cycle 33 Day 1 maintenance Pem/Pem  03/23/2022: Cycle 34 Day 1 maintenance Pem/Pem  04/13/2022: Cycle 35 Day 1 maintenance Pem/Pem  05/04/2022: Cycle 36 Day 1 maintenance Pem/Pem  05/25/2022: Cycle 37 Day 1 maintenance Pem/Pem  06/14/2022: Cycle 38 Day 1 maintenance Pem/Pem 07/05/2022: Cycle 39 Day 1 maintenance Pem/Pem 08/13/2022: Cycle 40 Day 1 maintenance Pem/Pem 09/06/2022:Cycle 41 Day 1 maintenance Pem/Pem 09/27/2022: Cycle 42 Day 1 maintenance Pem/Pem 10/19/2022: Cycle 43 Day 1 maintenance Pem/Pem 11/09/2022: Cycle 44 Day 1 maintenance Pem/Pem 11/30/2022: Cycle 45 Day 1 maintenance Pem/Pem 12/20/2022: Cycle 46 Day 1 maintenance Pem/Pem 01/10/2023: Cycle 47 Day 1 maintenance Pem/Pem 01/31/2023:Cycle 48 Day 1 maintenance Pem/Pem 02/22/2023: Cycle 49 Day 1 maintenance Pem/Pem 03/15/2023: Cycle 50 Day 1 maintenance Pem/Pem 04/04/2023: Cycle 51 Day 1 maintenance Pem/Pem 04/26/2023: Cycle 52 Day 1 maintenance Pem/Pem 05/17/2023: Cycle 53 Day 1 maintenance Pem/Pem 07/19/2023: Cycle 54 Day 1 maintenance Pembro (holding Pemetrexed ) 08/12/2023: Cycle 55 Day 1 maintenance Pem/Pem 09/02/2023: Cycle 56 Day 1 maintenance Pembro (holding Pemetrexed ) 09/23/2023: Cycle 57 Day 1 maintenance Pembro (holding Pemetrexed ) 10/15/2023: Cycle 58 Day 1 maintenance Pembro (holding Pemetrexed ) 11/27/2023:  Cycle 59 Day 1 maintenance Pembro (holding Pemetrexed ) 01/10/2024: Cycle 60 Day 1 maintenance Pembro (holding Pemetrexed ) 01/31/2024: Cycle 61 Day 1 maintenance Pembro (holding Pemetrexed )  Interval History:  Virginia [name] 68 y.o. female with medical history significant for metastatic adenocarcinoma of the lung presents for a follow up visit. The patient's last visit was on 01/31/2024.  On exam today Virginia [name] reports she continues to feel dizzy.  She is otherwise tolerating her treatments well and would like to proceed with pembrolizumab .  We did  discuss today what it would look like if she wanted to discontinue treatment, but after that discussion she noted she would like to continue.  She reports that she is having no issues with major swelling in her lower extremities, but is having more swelling around her face.  She reports that her energy levels remain poor but her appetite has been good.  She remains in good spirits.  She denies any fevers, chills, sweats, nausea, vomiting or diarrhea.  A full 10 point ROS is otherwise negative.  MEDICAL HISTORY:  Past Medical History:  Diagnosis Date   Anemia    Anxiety    Concussion 09/28/2019   DVT (deep venous thrombosis) (HCC) 2021   L leg   Dyspnea    GERD (gastroesophageal reflux disease)    Hypercholesterolemia    per pt, she does not have elevated lipids   Hypertension    met lung ca dx'd 09/2019   mets to spine, hip and brain   PONV (postoperative nausea and vomiting)    Tobacco abuse     SURGICAL HISTORY: Past Surgical History:  Procedure Laterality Date   ABDOMINAL HYSTERECTOMY     partial/ left ovaries   APPLICATION OF CRANIAL NAVIGATION N/A 12/02/2020   Procedure: APPLICATION OF CRANIAL NAVIGATION;  Surgeon: Manya Sells, MD;  Location: [facility] OR;  Service: Neurosurgery;  Laterality: N/A;   CHOLECYSTECTOMY     CRANIOTOMY N/A 12/02/2020   Procedure: Posterior fossa craniotomy for tumor resection with brainlab;  Surgeon: Manya Sells, MD;  Location: [facility] OR;  Service: Neurosurgery;  Laterality: N/A;   DILATION AND CURETTAGE OF UTERUS     IR IMAGING GUIDED PORT INSERTION  10/23/2019   IR PATIENT EVAL  TECH 0-60 MINS  11/21/2021   IR PATIENT EVAL TECH 0-60 MINS  11/24/2021   IR PATIENT EVAL TECH 0-60 MINS  11/29/2021   IR PATIENT EVAL TECH 0-60 MINS  12/04/2021   IR PATIENT EVAL TECH 0-60 MINS  12/12/2021   IR PATIENT EVAL TECH 0-60 MINS  12/19/2021   IR PATIENT EVAL TECH 0-60 MINS  12/27/2021   IR PATIENT EVAL TECH 0-60 MINS  01/03/2022   IR PATIENT EVAL TECH 0-60 MINS  01/10/2022   IR  PATIENT EVAL TECH 0-60 MINS  01/18/2022   IR PATIENT EVAL TECH 0-60 MINS  02/13/2022   IR PATIENT EVAL TECH 0-60 MINS  02/20/2022   IR RADIOLOGIST EVAL & MGMT  01/23/2022   IR REMOVAL TUN ACCESS W/ PORT W/O FL MOD SED  11/17/2021   KYPHOPLASTY N/A 03/15/2020   Procedure: Thoracic Eight KYPHOPLASTY;  Surgeon: Manya Sells, MD;  Location: [facility] OR;  Service: Neurosurgery;  Laterality: N/A;  prone    LIPOMA EXCISION  2018   removed under left breast and right thigh.   PERICARDIOCENTESIS N/A 12/10/2023   Procedure: PERICARDIOCENTESIS;  Surgeon: Kyra Phy, MD;  Location: [facility] INV[facility]IVE CV LAB;  Service: Cardiovascular;  Laterality: N/A;   TUBAL LIGATION      ALLERGIES:  is allergic to scopolamine .  MEDICATIONS:  Current Outpatient Medications  Medication Sig Dispense Refill   acetaminophen  (TYLENOL ) 325 MG tablet Take 1-2 tablets (325-650 mg total) by mouth every 4 (four) hours as needed for mild pain (pain score 1-3). (Patient taking differently: Take 500 mg by mouth every 4 (four) hours as needed for mild pain (pain score 1-3).)     albuterol  (VENTOLIN  HFA) 108 (90 Base) MCG/ACT inhaler INHALE 2 PUFFS INTO THE LUNGS EVERY 6 HOURS AS NEEDED FOR WHEEZING OR SHORTNESS OF BREATH 6.7 g 11   ALPRAZolam  (XANAX ) 0.25 MG tablet Take 1 tablet (0.25 mg total) by mouth 2 (two) times daily as needed for anxiety. 30 tablet 0   Cholecalciferol  (VITAMIN D3) 125 MCG (5000 UT) TABS Take 5,000 Units by mouth daily.     colchicine  0.6 MG tablet Take 1 tablet (0.6 mg total) by mouth daily. 90 tablet 0   Cyanocobalamin  (VITAMIN B12 PO) Take 5,000 mcg by mouth daily. Gummies     dexamethasone  (DECADRON ) 1 MG tablet Take 1 tablet (1 mg total) by mouth daily. (Patient taking differently: Take 0.5 mg by mouth daily.) 90 tablet 0   furosemide  (LASIX ) 20 MG tablet Take 1 tablet (20 mg total) by mouth 2 (two) times a week. Tues & Thurs     hydrocortisone  (ANUSOL -HC) 2.5 % rectal cream use in rectal area 2 to 3 times a day as  needed 30 g PRN   levETIRAcetam  (KEPPRA ) 1000 MG tablet Take 1 tablet (1,000 mg total) by mouth 2 (two) times daily. 180 tablet 2   magnesium  oxide (MAG-OX) 400 (240 Mg) MG tablet Take 1 tablet (400 mg total) by mouth 2 (two) times daily. 60 tablet 0   meclizine  (ANTIVERT ) 50 MG tablet Take 1 tablet (50 mg total) by mouth 3 (three) times daily. (Patient taking differently: Take 100 mg by mouth 3 (three) times daily. As needed) 90 tablet 2   metoprolol  succinate (TOPROL -XL) 25 MG 24 hr tablet Take 1 tablet (25 mg total) by mouth daily. 90 tablet 3   Multiple Vitamin (MULTIVITAMIN WITH MINERALS) TABS tablet Take 2 tablets by mouth daily. Gummy     Nutritional Supplements (,FEEDING SUPPLEMENT, PROSOURCE PLUS)  liquid Take 30 mLs by mouth 2 (two) times daily between meals. (Patient taking differently: Take 30 mLs by mouth 4 (four) times daily. Berry flavor)     OLANZapine  (ZYPREXA ) 2.5 MG tablet Take 1 tablet (2.5 mg total) by mouth at bedtime. 90 tablet 1   ondansetron  (ZOFRAN ) 4 MG tablet TAKE 1 TABLET(4 MG) BY MOUTH EVERY 8 HOURS AS NEEDED FOR NAUSEA OR VOMITING 40 tablet 2   oxyCODONE  (OXY IR/ROXICODONE ) 5 MG immediate release tablet Take 1 tablet (5 mg total) by mouth every 4 (four) hours as needed for severe pain (pain score 7-10). 90 tablet 0   pantoprazole  (PROTONIX ) 40 MG tablet TAKE 1 TABLET(40 MG) BY MOUTH DAILY 90 tablet 3   polyethylene glycol (MIRALAX  / GLYCOLAX ) 17 g packet Take 17 g by mouth daily as needed for moderate constipation or mild constipation.     potassium chloride  SA (KLOR-CON  M) 20 MEQ tablet TAKE 1 TABLET(20 MEQ) BY MOUTH TWICE DAILY 90 tablet 1   senna-docusate (SENOKOT-S) 8.6-50 MG tablet Take 2 tablets by mouth daily as needed (Patient preference, Sennakot S for mild constipation). (Patient taking differently: Take 2 tablets by mouth daily as needed for mild constipation. 2 tabs 2 x daily)     spironolactone  (ALDACTONE ) 25 MG tablet Take 1 tablet (25 mg total) by mouth  daily. 90 tablet 3   XTAMPZA  ER 13.5 MG C12A Take 1 capsule by mouth in the morning and at bedtime. 60 capsule 0   No current facility-administered medications for this visit.    REVIEW OF SYSTEMS:   Constitutional: ( - ) fevers, ( - )  chills , ( - ) night sweats Eyes: ( - ) blurriness of vision, ( - ) double vision, ( - ) watery eyes Ears, nose, mouth, throat, and face: ( - ) mucositis, ( - ) sore throat Respiratory: ( - ) cough, ( + ) dyspnea, ( - ) wheezes Cardiovascular: ( - ) palpitation, ( - ) chest discomfort, ( - ) lower extremity swelling Gastrointestinal:  ( + ) nausea, ( - ) heartburn, ( - ) change in bowel habits Skin: ( - ) abnormal skin rashes Lymphatics: ( - ) new lymphadenopathy, ( - ) easy bruising Neurological: ( - ) numbness, ( - ) tingling, ( - ) new weaknesses Behavioral/Psych: ( - ) mood change, ( - ) new changes  All other systems were reviewed with the patient and are negative.  PHYSICAL EXAMINATION: ECOG PERFORMANCE STATUS: 2 - Symptomatic, <50% confined to bed  Vitals:   02/21/24 1449  BP: 125/73  Pulse: 78  Resp: 14  Temp: 98 F (36.7 C)  SpO2: 96%   Filed Weights   02/21/24 1449  Weight: 181 lb (82.1 kg)     GENERAL: well appearing middle aged Caucasian female in NAD  SKIN: skin color, texture, turgor are normal, no rashes or significant lesions EYES: conjunctiva are pink and non-injected, sclera clear LUNGS: wheezing at lung bases. clear to auscultation and percussion with normal breathing effort HEART: regular rate & rhythm and no murmurs and bilateral trace pitting lower extremity edema Musculoskeletal: no cyanosis of digits and no clubbing PSYCH: alert & oriented x 3, fluent speech NEURO: no focal motor/sensory deficits  LABORATORY DATA:  I have reviewed the data as listed    Latest Ref Rng & Units 02/21/2024    2:19 PM 01/31/2024   11:21 AM 01/10/2024   12:18 PM  CBC  WBC 4.0 - 10.5 K/uL 10.3  8.0  9.0   Hemoglobin 12.0 - 15.0 g/dL  78.2  95.6  21.3   Hematocrit 36.0 - 46.0 % 39.2  38.7  37.0   Platelets 150 - 400 K/uL 225  259  194        Latest Ref Rng & Units 02/21/2024    2:19 PM 01/31/2024   11:21 AM 01/10/2024   12:18 PM  CMP  Glucose 70 - 99 mg/dL 086  85  75   BUN 8 - 23 mg/dL 38  42  37   Creatinine 0.44 - 1.00 mg/dL 5.78  4.69  6.29   Sodium 135 - 145 mmol/L 142  142  140   Potassium 3.5 - 5.1 mmol/L 4.1  3.9  4.9   Chloride 98 - 111 mmol/L 106  108  106   CO2 22 - 32 mmol/L 30  28  30    Calcium  8.9 - 10.3 mg/dL 9.1  9.4  9.3   Total Protein 6.5 - 8.1 g/dL 6.2  6.5  6.0   Total Bilirubin 0.0 - 1.2 mg/dL 0.4  0.3  0.3   Alkaline Phos 38 - 126 U/L 39  39  37   AST 15 - 41 U/L 12  12  13    ALT 0 - 44 U/L 12  12  17       RADIOGRAPHIC STUDIES: I personally have viewed the radiographic studies below: CT CHEST ABDOMEN PELVIS W CONTRAST Result Date: 01/28/2024 CLINICAL DATA:  Metastatic lung cancer, assess treatment response, ongoing immunotherapy, XRT complete * Tracking Code: BO * EXAM: CT CHEST, ABDOMEN, AND PELVIS WITH CONTRAST TECHNIQUE: Multidetector CT imaging of the chest, abdomen and pelvis was performed following the standard protocol during bolus administration of intravenous contrast. RADIATION DOSE REDUCTION: This exam was performed according to the departmental dose-optimization program which includes automated exposure control, adjustment of the mA and/or kV according to patient size and/or use of iterative reconstruction technique. CONTRAST:  OMNIPAQUE  IOHEXOL  300 MG/ML  SOLN COMPARISON:  11/01/2023 FINDINGS: CT CHEST FINDINGS Cardiovascular: Aortic atherosclerosis. Mild cardiomegaly. Left coronary artery calcifications. Unchanged, large pericardial effusion. Mediastinum/Nodes: No enlarged mediastinal, hilar, or axillary lymph nodes. Thyroid  gland, trachea, and esophagus demonstrate no significant findings. Lungs/Pleura: Unchanged post treatment appearance of the chest with dependent bibasilar  bandlike scarring and atelectasis (series 7, image 86). Unchanged irregular nodule of the infrahilar right lower lobe measuring 1.0 x 0.4 cm (series 7, image 78). Background of extensive, fine nodularity throughout the lungs, unchanged, individual nodules no greater than 0.3 cm, for example in the right apex (series 7, image 26). Scattered additional ground-glass opacities, likewise unchanged. No pleural effusion or pneumothorax. Musculoskeletal: No chest wall abnormality. Multiple subacute fractures of the anterior ribs bilaterally. Subtle, nondisplaced sclerotic fracture of the mid sternal body (series 6, image 115). Vertebral cement augmentation of T8 (series 6, image 113). CT ABDOMEN PELVIS FINDINGS Hepatobiliary: No focal liver abnormality is seen. Status post cholecystectomy. No biliary dilatation. Pancreas: Unremarkable. No pancreatic ductal dilatation or surrounding inflammatory changes. Spleen: Normal in size without significant abnormality. Adrenals/Urinary Tract: Adrenal glands are unremarkable. Kidneys are normal, without renal calculi, solid lesion, or hydronephrosis. Bladder is unremarkable. Stomach/Bowel: Stomach is within normal limits. Appendix appears normal. No evidence of bowel wall thickening, distention, or inflammatory changes. Vascular/Lymphatic: Aortic atherosclerosis. No enlarged abdominal or pelvic lymph nodes. Reproductive: Status post hysterectomy. Other: No abdominal wall hernia or abnormality. No ascites. Musculoskeletal: No acute osseous findings. IMPRESSION: 1. Unchanged post treatment appearance of the chest  with dependent bibasilar bandlike scarring and atelectasis. Unchanged irregular nodule of the infrahilar right lower lobe. 2. Background of extensive, fine nodularity throughout the lungs, unchanged, individual nodules no greater than 0.3 cm. Scattered additional ground-glass opacities, likewise unchanged. Attention on follow-up. 3. No evidence of lymphadenopathy or metastatic  disease in the abdomen or pelvis. 4. Unchanged, large pericardial effusion. 5. Subacute fractures of the sternum and anterior ribs. 6. Coronary artery disease. Aortic Atherosclerosis (ICD10-I70.0). Electronically Signed   By: Fredricka Jenny M.D.   On: 01/28/2024 07:33     ASSESSMENT & PLAN RIELYNN TRULSON is a 68 y.o. female with medical history significant for metastatic adenocarcinoma of the lung presents for a follow up visit. 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   Previously we discussed Carboplatin /Pembrolizumab /Pemetrexed  and the expected side effect from these medications.  We discussed the possible side effects of immunotherapy including colitis, hepatitis, dermatitis, and pneumonitis.  Additionally we discussed the side effects of carboplatin  which include suppression of blood counts, nephrotoxicity, and nausea.  Additionally we discussed pemetrexed  which can also have effects on counts and would require supplementation with vitamin B12 and folic acid .  She voiced her understanding of these side effects and treatment plans moving forward.  I also noted that we would be able to drop the chemotherapy agents that she had intolerance to continue with pembrolizumab  alone given her high TPS score.  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 Due to worsening dizziness, she resumed this from Cycle 25-27.  PLAN: --Due for Cycle 62, Day 1 of maintenance single agent Pembrolizumab  today.  --Labs from today were reviewed and adequate for treatment.  Labs today show WBC 10.3, hemoglobin 13.1, MCV 95.4, platelets 225, creatinine overall stable and LFTs in range. --Last CT scan in 01/27/2024 showed no evidence of progressive disease. Next scan due around  August 2025.  --RTC q 3 weeks for labs, follow up visit prior to Cycle 63.  #Brain Metastasis --appreciate the assistance of Dr. Mark Sil in treating her brain metastasis and symptoms. --radiation therapy to the brain completed on 11/16/2019.  --MRI brain on 04/29/2020 showed evidence of small brain bleed. Eliquis  therapy was discontinued.  --MRI on 11/04/2020 showed concern for progression of intracranial disease.  --On 12/02/2020, patient underwent craniotomy, resection of progressive cerebellar mass by Dr. Nigel Bart. 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  --MRI on 12/23/2020 showed evidence of radiation necrosis. Dr. Mark Sil evaluated the patient on 01/02/2021 with recommendations to add IV Bevacizumab  10 mg/kg q 3 weeks ( started 01/26/2021). Completed a full course of this therapy. --Patient was evaluated by Dr. Mark Sil on 08/29/2021 and recommended to resume Bevacizumab  due to worsening dizziness.  Which was given from Cycle 25-27.  Plan: --continue to follow with Dr. Mark Sil  --Most recent MRI from 12/18/2023 showed evidence of intracranial bleeding.  Eliquis  held indefinitely.  --Next MRI brain scheduled for 03/19/2024.   #Nausea/Vomiting, worsening --Regimen includes Zofran  4-8mg  q8h PRN and compazine  10mg  q6H for breakthrough PRN, Olanzpaine 2.5 mg PO QHS to help with nausea.  She is currently using Phenergan  for breakthrough. --continue to monitor   #Hypokalemia --likely 2/2 to decreased PO intake, hypovolemia, and BP medications --Potassium level 3.9 today.   --Currently on potassium chloride  tablets, 40 mEq PO potassium in  AM and in PM.   #Pain Control, stable --continue oxycodone  5mg  q4H PRN. Can increase dose to 10mg  q6H if pain is severe. She is taking approximately 3-4 pills per day.  --continue Xtampza  13.5 mg ER BID for long acting support.  --continue to take with Senokot to prevent opioid induced constipation. 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    # Hypertension: --Continue Norvasc  to 10 mg daily.  --continue to monitor at home and in clinic.    Orders Placed This Encounter  Procedures   TSH    Standing Status:   Future    Expected Date:   04/24/2024    Expiration Date:   04/24/2025   CBC with Differential (Cancer Center Only)    Standing Status:   Future    Expected Date:   04/24/2024    Expiration Date:   04/24/2025   CMP (Cancer Center only)    Standing Status:   Future    Expected Date:   04/24/2024    Expiration Date:   04/24/2025    All questions were answered. The patient knows to call the clinic with any problems, questions or concerns.  I have spent a total of 30 minutes minutes of face-to-face and non-face-to-face time, preparing to see the patient, performing a medically appropriate examination, counseling and educating the patient,  documenting clinical information in the electronic health record, and care coordination.   Rogerio Clay, MD Department of Hematology/Oncology [facility] Phone: [phone] Pager: [phone] Email: [email or web address]   02/23/2024 3:20 PM   Literature Support:  Almarie Jacob, Rodrguez-Abreu D, Gadgeel S, Esteban E, Felip E, De Angelis F, Domine M, Clingan P, Hochmair MJ, [place], Cheng SY, Bischoff HG, Peled N, Grossi F, Jennens RR, Reck M, [place], [place], [place], Rubio-Viqueira B, Novello S, Kurata T, Gray JE, Vida J, Wei Z, Yang J, Raftopoulos H, [place], [employer]; [id number] Investigators. Pembrolizumab  plus Chemotherapy in Metastatic Non-Small-Cell Lung Cancer. Mel Spine Med. 2018 May 31;378(22):2078-2092.  --Median progression-free survival was 8.8 months (95% CI, 7.6 to 9.2) in the pembrolizumab -combination group and 4.9 months (95% CI, 4.7 to 5.5) in the placebo-combination group  (hazard ratio for disease progression or death, 0.52; 95% CI, 0.43 to 0.64; P<0.001). Adverse events of grade 3 or higher occurred in 67.2% of the patients in the pembrolizumab -combination group and in 65.8% of those in the placebo-combination group.  Jasper Messenger L. Efficacy of pemetrexed -based regimens in advanced non-small cell lung cancer patients with activating epidermal growth factor receptor mutations after tyrosine kinase inhibitor failure: a systematic review. Onco Targets Ther. 2018;11:2121-2129.   --The weighted median PFS, median OS, and ORR for patients treated with pem regimens were 5.09 months, 15.91 months, and 30.19%, respectively. Our systematic review results showed a favorable efficacy profile of pem regimens in NSCLC patients with EGFR mutation after EGFR-TKI failure.

## 2024-02-21 NOTE — Patient Instructions (Signed)
 [facility] - A DEPT OF [facility]  Discharge Instructions: Thank you for choosing [facility] to provide your oncology and hematology care.   If you have a lab appointment with the Cancer Center, please go directly to the Cancer Center and check in at the registration area.   Wear comfortable clothing and clothing appropriate for easy access to any Portacath or PICC line.   We strive to give you quality time with your provider. You may need to reschedule your appointment if you arrive late (15 or more minutes).  Arriving late affects you and other patients whose appointments are after yours.  Also, if you miss three or more appointments without notifying the office, you may be dismissed from the clinic at the provider's discretion.      For prescription refill requests, have your pharmacy contact our office and allow 72 hours for refills to be completed.    Today you received the following chemotherapy and/or immunotherapy agents: Keytruda      To help prevent nausea and vomiting after your treatment, we encourage you to take your nausea medication as directed.  BELOW ARE SYMPTOMS THAT SHOULD BE REPORTED IMMEDIATELY: *FEVER GREATER THAN 100.4 F (38 C) OR HIGHER *CHILLS OR SWEATING *NAUSEA AND VOMITING THAT IS NOT CONTROLLED WITH YOUR NAUSEA MEDICATION *UNUSUAL SHORTNESS OF BREATH *UNUSUAL BRUISING OR BLEEDING *URINARY PROBLEMS (pain or burning when urinating, or frequent urination) *BOWEL PROBLEMS (unusual diarrhea, constipation, pain near the anus) TENDERNESS IN MOUTH AND THROAT WITH OR WITHOUT PRESENCE OF ULCERS (sore throat, sores in mouth, or a toothache) UNUSUAL RASH, SWELLING OR PAIN  UNUSUAL VAGINAL DISCHARGE OR ITCHING   Items with * indicate a potential emergency and should be followed up as soon as possible or go to the Emergency Department if any problems should occur.  Please show the CHEMOTHERAPY ALERT CARD or IMMUNOTHERAPY  ALERT CARD at check-in to the Emergency Department and triage nurse.  Should you have questions after your visit or need to cancel or reschedule your appointment, please contact [facility] - A DEPT OF [facility]  Dept: [phone]  and follow the prompts.  Office hours are 8:00 a.m. to 4:30 p.m. Monday - Friday. Please note that voicemails left after 4:00 p.m. may not be returned until the following business day.  We are closed weekends and major holidays. You have access to a nurse at all times for urgent questions. Please call the main number to the clinic Dept: [phone] and follow the prompts.   For any non-urgent questions, you may also contact your provider using MyChart. We now offer e-Visits for anyone 84 and older to request care online for non-urgent symptoms. For details visit [email or web address].   Also download the MyChart app! Go to the app store, search "MyChart", open the app, select [place], and log in with your MyChart username and password.

## 2024-02-23 ENCOUNTER — Encounter: Payer: Self-pay | Admitting: Hematology and Oncology

## 2024-02-26 ENCOUNTER — Inpatient Hospital Stay: Admitting: Nurse Practitioner

## 2024-02-26 DIAGNOSIS — C3490 Malignant neoplasm of unspecified part of unspecified bronchus or lung: Secondary | ICD-10-CM | POA: Diagnosis not present

## 2024-02-26 DIAGNOSIS — K5903 Drug induced constipation: Secondary | ICD-10-CM

## 2024-02-26 DIAGNOSIS — C7951 Secondary malignant neoplasm of bone: Secondary | ICD-10-CM

## 2024-02-26 DIAGNOSIS — G893 Neoplasm related pain (acute) (chronic): Secondary | ICD-10-CM | POA: Diagnosis not present

## 2024-02-26 DIAGNOSIS — Z515 Encounter for palliative care: Secondary | ICD-10-CM | POA: Diagnosis not present

## 2024-02-27 ENCOUNTER — Encounter: Payer: Self-pay | Admitting: Hematology and Oncology

## 2024-02-27 ENCOUNTER — Encounter: Payer: Self-pay | Admitting: Nurse Practitioner

## 2024-02-27 NOTE — Progress Notes (Signed)
 Palliative Medicine [facility]  Telephone:(336) [phone] Fax:(336) [phone]   Name: Virginia [name] Date: 02/27/2024 MRN: [id number]  DOB: 07-31-1956  Patient Care Team: Sylvan Evener, MD as PCP - General (Internal Medicine) Ander Bame, MD as PCP - Hematology/Oncology (Hematology and Oncology) Jaye Mettle, RN as Oncology Nurse Navigator Rafe Bunde, DO as Consulting Physician [facility] and Palliative Medicine) Lbcardiology, Zola Hint, MD as Rounding Team (Cardiology) Pickenpack-Cousar, Giles Labrum, NP as Nurse Practitioner [facility] and Palliative Medicine)   I connected with Virginia [name] on 02/27/24 at  4:00 PM EDT by telephone and verified that I am speaking with the correct person using two identifiers.   I discussed the limitations, risks, security and privacy concerns of performing an evaluation and management service by telemedicine and the availability of in-person appointments. I also discussed with the patient that there may be a patient responsible charge related to this service. The patient expressed understanding and agreed to proceed.   Other persons participating in the visit and their role in the encounter: Husband   Patient's location: Home   Provider's location: [facility]   INTERVAL HISTORY: Virginia [name] is a 68 y.o. female with oncologic medical history including non-small cell lung cancer (09/2019) with metastatic disease to the bone and brain. Palliative ask to see for symptom management and goals of care.   SOCIAL HISTORY:     reports that she quit smoking about 11 years ago. Her smoking use included cigarettes. She has never used smokeless tobacco. She reports current alcohol use. She reports that she does not use drugs.  ADVANCE DIRECTIVES:  Advanced directives on file naming Virginia [name] and Virginia [name] as the primary and secondary decision makers should the patient become unable to speak for themselves.   CODE  STATUS: Full code  PAST MEDICAL HISTORY: Past Medical History:  Diagnosis Date   Anemia    Anxiety    Concussion 09/28/2019   DVT (deep venous thrombosis) (HCC) 2021   L leg   Dyspnea    GERD (gastroesophageal reflux disease)    Hypercholesterolemia    per pt, she does not have elevated lipids   Hypertension    met lung ca dx'd 09/2019   mets to spine, hip and brain   PONV (postoperative nausea and vomiting)    Tobacco abuse     ALLERGIES:  is allergic to scopolamine .  MEDICATIONS:  Current Outpatient Medications  Medication Sig Dispense Refill   acetaminophen  (TYLENOL ) 325 MG tablet Take 1-2 tablets (325-650 mg total) by mouth every 4 (four) hours as needed for mild pain (pain score 1-3). (Patient taking differently: Take 500 mg by mouth every 4 (four) hours as needed for mild pain (pain score 1-3).)     albuterol  (VENTOLIN  HFA) 108 (90 Base) MCG/ACT inhaler INHALE 2 PUFFS INTO THE LUNGS EVERY 6 HOURS AS NEEDED FOR WHEEZING OR SHORTNESS OF BREATH 6.7 g 11   ALPRAZolam  (XANAX ) 0.25 MG tablet Take 1 tablet (0.25 mg total) by mouth 2 (two) times daily as needed for anxiety. 30 tablet 0   Cholecalciferol  (VITAMIN D3) 125 MCG (5000 UT) TABS Take 5,000 Units by mouth daily.     colchicine  0.6 MG tablet Take 1 tablet (0.6 mg total) by mouth daily. 90 tablet 0   Cyanocobalamin  (VITAMIN B12 PO) Take 5,000 mcg by mouth daily. Gummies     dexamethasone  (DECADRON ) 1 MG tablet Take 1 tablet (1 mg total) by mouth daily. (Patient  taking differently: Take 0.5 mg by mouth daily.) 90 tablet 0   furosemide  (LASIX ) 20 MG tablet Take 1 tablet (20 mg total) by mouth 2 (two) times a week. Tues & Thurs     hydrocortisone  (ANUSOL -HC) 2.5 % rectal cream use in rectal area 2 to 3 times a day as needed 30 g PRN   levETIRAcetam  (KEPPRA ) 1000 MG tablet Take 1 tablet (1,000 mg total) by mouth 2 (two) times daily. 180 tablet 2   magnesium  oxide (MAG-OX) 400 (240 Mg) MG tablet Take 1 tablet (400 mg total) by  mouth 2 (two) times daily. 60 tablet 0   meclizine  (ANTIVERT ) 50 MG tablet Take 1 tablet (50 mg total) by mouth 3 (three) times daily. (Patient taking differently: Take 100 mg by mouth 3 (three) times daily. As needed) 90 tablet 2   metoprolol  succinate (TOPROL -XL) 25 MG 24 hr tablet Take 1 tablet (25 mg total) by mouth daily. 90 tablet 3   Multiple Vitamin (MULTIVITAMIN WITH MINERALS) TABS tablet Take 2 tablets by mouth daily. Gummy     Nutritional Supplements (,FEEDING SUPPLEMENT, PROSOURCE PLUS) liquid Take 30 mLs by mouth 2 (two) times daily between meals. (Patient taking differently: Take 30 mLs by mouth 4 (four) times daily. Berry flavor)     OLANZapine  (ZYPREXA ) 2.5 MG tablet Take 1 tablet (2.5 mg total) by mouth at bedtime. 90 tablet 1   ondansetron  (ZOFRAN ) 4 MG tablet TAKE 1 TABLET(4 MG) BY MOUTH EVERY 8 HOURS AS NEEDED FOR NAUSEA OR VOMITING 40 tablet 2   oxyCODONE  (OXY IR/ROXICODONE ) 5 MG immediate release tablet Take 1 tablet (5 mg total) by mouth every 4 (four) hours as needed for severe pain (pain score 7-10). 90 tablet 0   pantoprazole  (PROTONIX ) 40 MG tablet TAKE 1 TABLET(40 MG) BY MOUTH DAILY 90 tablet 3   polyethylene glycol (MIRALAX  / GLYCOLAX ) 17 g packet Take 17 g by mouth daily as needed for moderate constipation or mild constipation.     potassium chloride  SA (KLOR-CON  M) 20 MEQ tablet TAKE 1 TABLET(20 MEQ) BY MOUTH TWICE DAILY 90 tablet 1   senna-docusate (SENOKOT-S) 8.6-50 MG tablet Take 2 tablets by mouth daily as needed (Patient preference, Sennakot S for mild constipation). (Patient taking differently: Take 2 tablets by mouth daily as needed for mild constipation. 2 tabs 2 x daily)     spironolactone  (ALDACTONE ) 25 MG tablet Take 1 tablet (25 mg total) by mouth daily. 90 tablet 3   XTAMPZA  ER 13.5 MG C12A Take 1 capsule by mouth in the morning and at bedtime. 60 capsule 0   No current facility-administered medications for this visit.    VITAL SIGNS: There were no  vitals taken for this visit. There were no vitals filed for this visit.   Estimated body mass index is 33.11 kg/m as calculated from the following:   Height as of 02/17/24: 5' 2 (1.575 m).   Weight as of 02/21/24: 181 lb (82.1 kg).   PERFORMANCE STATUS (ECOG) : 1 - Symptomatic but completely ambulatory  IMPRESSION: Discussed the use of AI scribe software for clinical note transcription with the patient, who gave verbal consent to proceed.  History of Present Illness Virginia [name] is a 68 year old female who I connected with by phone for symptom management follow-up. No acute distress noted. She is doing well overall.  Denies concerns for nausea or vomiting. She has a history of constipation, managed with Senokot twice daily and Miralax  as needed, which is most days.  Skipping doses leads to difficulty with bowel movements. No issues with diarrhea, nausea, or vomiting. Reports good appetite.   Virginia [name] states her pain is well controlled on current regimen. She is taking Xtampza  every 12 hours and Oxycodone  every 4-6 hours as needed for breakthrough pain is effective. Does not require around the clock use of breakthrough medication. Some days she only requiring once. No adjustments made to regimen at this time.   All questions answered and support provided.   I discussed the importance of continued conversation with family and their medical providers regarding overall plan of care and treatment options, ensuring decisions are within the context of the patients values and GOCs.  Assessment & Plan  Pain management Chronic pain managed with Xtampza  extended release and breakthrough medication. Pain levels are 6-7 out of 10 during visit. She reports requiring breakthrough medication use 1-5 times daily depending on level of activities for the day. Current regimen is manageable, though pain levels have recently increased. - Continue Xtampza  13.5mg  extended release every 12 hours. -Continue  Oxycodone  every 4-6 hours as needed for breakthrough pain. - Monitor pain levels for consistency and severity. - Discuss potential adjustments to pain regimen if pain becomes more consistent and severe.  Constipation Constipation managed with Senokot twice daily and Miralax  as needed. Regular use is necessary to prevent difficulty with bowel movements. - Continue Senokot twice daily. - Use Miralax  as needed for constipation.  I will plan to follow-up with patient in 4-6 weeks. Sooner if needed.     Patient expressed understanding and was in agreement with this plan. She also understands that She can call the clinic at any time with any questions, concerns, or complaints.   Any controlled substances utilized were prescribed in the context of palliative care. PDMP has been reviewed.   Visit consisted of counseling and education dealing with the complex and emotionally intense issues of symptom management and palliative care in the setting of serious and potentially life-threatening illness.  Virginia [name], AGPCNP-BC  Palliative Medicine Team/[facility]

## 2024-03-02 ENCOUNTER — Telehealth: Payer: Self-pay

## 2024-03-02 ENCOUNTER — Other Ambulatory Visit: Payer: Self-pay | Admitting: Cardiology

## 2024-03-02 NOTE — Telephone Encounter (Signed)
 CRM received:  Caller/Agency: Sari with [facility] Callback Number: [phone] secure line Service Requested: Physical Therapy Frequency: 1x week for 8 weeks Any new concerns about the patient? No- need to recertify   Okay to give verbal order for another 8 weeks of PT?

## 2024-03-03 NOTE — Telephone Encounter (Signed)
 Verbal orders given 03/02/2024

## 2024-03-04 MED ORDER — COLCHICINE 0.6 MG PO TABS: 0.6000 mg | ORAL_TABLET | Freq: Every day | ORAL | 0 refills | Status: DC

## 2024-03-10 ENCOUNTER — Telehealth: Payer: Self-pay | Admitting: Internal Medicine

## 2024-03-10 ENCOUNTER — Encounter: Payer: Self-pay | Admitting: Internal Medicine

## 2024-03-10 ENCOUNTER — Telehealth: Payer: Self-pay

## 2024-03-10 DIAGNOSIS — Z0279 Encounter for issue of other medical certificate: Secondary | ICD-10-CM

## 2024-03-10 NOTE — Telephone Encounter (Signed)
 CRM received:  Caller/Agency: [facility]    Callback Number: [phone]    Service Requested: Skilled Nursing    Frequency: twice weekly    Any new concerns about the patient? Yes, for bathing order. Please call Darina Maine to give verbal orders for skilled nursing?

## 2024-03-10 NOTE — Telephone Encounter (Signed)
 Husband called here asking for assistance with bathing Virginia [name]. I have called [facility] about this. They think this has been ordered already and they state they will contact husband. Told husband we sign all Home Health orders promptly but we will be out of the office starting tomorrow for several days due to vacation and July 4th holiday.  MJB, MD

## 2024-03-12 ENCOUNTER — Inpatient Hospital Stay: Admitting: Hematology and Oncology

## 2024-03-12 ENCOUNTER — Inpatient Hospital Stay

## 2024-03-12 ENCOUNTER — Inpatient Hospital Stay: Attending: Hematology and Oncology

## 2024-03-12 ENCOUNTER — Encounter: Payer: Self-pay | Admitting: Nurse Practitioner

## 2024-03-12 ENCOUNTER — Inpatient Hospital Stay (HOSPITAL_BASED_OUTPATIENT_CLINIC_OR_DEPARTMENT_OTHER): Admitting: Nurse Practitioner

## 2024-03-12 ENCOUNTER — Encounter: Payer: Self-pay | Admitting: Hematology and Oncology

## 2024-03-12 VITALS — BP 123/63 | HR 70 | Temp 97.4°F | Resp 16

## 2024-03-12 VITALS — BP 118/76 | HR 69 | Temp 97.5°F | Resp 13 | Wt 180.0 lb

## 2024-03-12 DIAGNOSIS — R0600 Dyspnea, unspecified: Secondary | ICD-10-CM | POA: Diagnosis not present

## 2024-03-12 DIAGNOSIS — Z79899 Other long term (current) drug therapy: Secondary | ICD-10-CM | POA: Insufficient documentation

## 2024-03-12 DIAGNOSIS — K5900 Constipation, unspecified: Secondary | ICD-10-CM | POA: Diagnosis not present

## 2024-03-12 DIAGNOSIS — E876 Hypokalemia: Secondary | ICD-10-CM | POA: Insufficient documentation

## 2024-03-12 DIAGNOSIS — R112 Nausea with vomiting, unspecified: Secondary | ICD-10-CM | POA: Insufficient documentation

## 2024-03-12 DIAGNOSIS — K5903 Drug induced constipation: Secondary | ICD-10-CM

## 2024-03-12 DIAGNOSIS — Z8673 Personal history of transient ischemic attack (TIA), and cerebral infarction without residual deficits: Secondary | ICD-10-CM | POA: Insufficient documentation

## 2024-03-12 DIAGNOSIS — G893 Neoplasm related pain (acute) (chronic): Secondary | ICD-10-CM | POA: Diagnosis not present

## 2024-03-12 DIAGNOSIS — G8929 Other chronic pain: Secondary | ICD-10-CM | POA: Diagnosis not present

## 2024-03-12 DIAGNOSIS — Z90710 Acquired absence of both cervix and uterus: Secondary | ICD-10-CM | POA: Diagnosis not present

## 2024-03-12 DIAGNOSIS — Z9049 Acquired absence of other specified parts of digestive tract: Secondary | ICD-10-CM | POA: Diagnosis not present

## 2024-03-12 DIAGNOSIS — R918 Other nonspecific abnormal finding of lung field: Secondary | ICD-10-CM | POA: Diagnosis not present

## 2024-03-12 DIAGNOSIS — Z86718 Personal history of other venous thrombosis and embolism: Secondary | ICD-10-CM | POA: Diagnosis not present

## 2024-03-12 DIAGNOSIS — I10 Essential (primary) hypertension: Secondary | ICD-10-CM | POA: Diagnosis not present

## 2024-03-12 DIAGNOSIS — Z515 Encounter for palliative care: Secondary | ICD-10-CM

## 2024-03-12 DIAGNOSIS — C3490 Malignant neoplasm of unspecified part of unspecified bronchus or lung: Secondary | ICD-10-CM | POA: Diagnosis not present

## 2024-03-12 DIAGNOSIS — Z7901 Long term (current) use of anticoagulants: Secondary | ICD-10-CM | POA: Diagnosis not present

## 2024-03-12 DIAGNOSIS — Z5112 Encounter for antineoplastic immunotherapy: Secondary | ICD-10-CM | POA: Diagnosis present

## 2024-03-12 DIAGNOSIS — R530 Neoplastic (malignant) related fatigue: Secondary | ICD-10-CM

## 2024-03-12 DIAGNOSIS — Z87891 Personal history of nicotine dependence: Secondary | ICD-10-CM | POA: Insufficient documentation

## 2024-03-12 DIAGNOSIS — R42 Dizziness and giddiness: Secondary | ICD-10-CM | POA: Insufficient documentation

## 2024-03-12 DIAGNOSIS — F419 Anxiety disorder, unspecified: Secondary | ICD-10-CM | POA: Diagnosis not present

## 2024-03-12 DIAGNOSIS — C7931 Secondary malignant neoplasm of brain: Secondary | ICD-10-CM

## 2024-03-12 DIAGNOSIS — C3431 Malignant neoplasm of lower lobe, right bronchus or lung: Secondary | ICD-10-CM | POA: Insufficient documentation

## 2024-03-12 DIAGNOSIS — C7951 Secondary malignant neoplasm of bone: Secondary | ICD-10-CM | POA: Diagnosis not present

## 2024-03-12 DIAGNOSIS — M7989 Other specified soft tissue disorders: Secondary | ICD-10-CM | POA: Diagnosis not present

## 2024-03-12 DIAGNOSIS — R630 Anorexia: Secondary | ICD-10-CM

## 2024-03-12 LAB — CBC WITH DIFFERENTIAL (CANCER CENTER ONLY)
Abs Immature Granulocytes: 0.09 10*3/uL — ABNORMAL HIGH (ref 0.00–0.07)
Basophils Absolute: 0 10*3/uL (ref 0.0–0.1)
Basophils Relative: 1 %
Eosinophils Absolute: 0.1 10*3/uL (ref 0.0–0.5)
Eosinophils Relative: 2 %
HCT: 37.2 % (ref 36.0–46.0)
Hemoglobin: 12.7 g/dL (ref 12.0–15.0)
Immature Granulocytes: 1 %
Lymphocytes Relative: 39 %
Lymphs Abs: 3.2 10*3/uL (ref 0.7–4.0)
MCH: 32.3 pg (ref 26.0–34.0)
MCHC: 34.1 g/dL (ref 30.0–36.0)
MCV: 94.7 fL (ref 80.0–100.0)
Monocytes Absolute: 0.9 10*3/uL (ref 0.1–1.0)
Monocytes Relative: 11 %
Neutro Abs: 3.9 10*3/uL (ref 1.7–7.7)
Neutrophils Relative %: 46 %
Platelet Count: 214 10*3/uL (ref 150–400)
RBC: 3.93 MIL/uL (ref 3.87–5.11)
RDW: 12.7 % (ref 11.5–15.5)
WBC Count: 8.2 10*3/uL (ref 4.0–10.5)
nRBC: 0 % (ref 0.0–0.2)

## 2024-03-12 LAB — CMP (CANCER CENTER ONLY)
ALT: 14 U/L (ref 0–44)
AST: 13 U/L — ABNORMAL LOW (ref 15–41)
Albumin: 3.5 g/dL (ref 3.5–5.0)
Alkaline Phosphatase: 41 U/L (ref 38–126)
Anion gap: 5 (ref 5–15)
BUN: 38 mg/dL — ABNORMAL HIGH (ref 8–23)
CO2: 32 mmol/L (ref 22–32)
Calcium: 9.3 mg/dL (ref 8.9–10.3)
Chloride: 106 mmol/L (ref 98–111)
Creatinine: 0.93 mg/dL (ref 0.44–1.00)
GFR, Estimated: >60 mL/min (ref >60)
Glucose, Bld: 91 mg/dL (ref 70–99)
Potassium: 3.9 mmol/L (ref 3.5–5.1)
Sodium: 143 mmol/L (ref 135–145)
Total Bilirubin: 0.4 mg/dL (ref 0.0–1.2)
Total Protein: 6.3 g/dL — ABNORMAL LOW (ref 6.5–8.1)

## 2024-03-12 LAB — TSH: TSH: 1.79 u[IU]/mL (ref 0.350–4.500)

## 2024-03-12 MED ORDER — PROCHLORPERAZINE MALEATE 10 MG PO TABS
10.0000 mg | ORAL_TABLET | Freq: Once | ORAL | Status: DC
Start: 2024-03-12 — End: 2024-03-12

## 2024-03-12 MED ORDER — SODIUM CHLORIDE 0.9 % IV SOLN
200.0000 mg | Freq: Once | INTRAVENOUS | Status: AC
  Administered 2024-03-12: 200 mg via INTRAVENOUS
  Filled 2024-03-12: qty 200

## 2024-03-12 MED ORDER — SODIUM CHLORIDE 0.9 % IV SOLN: Freq: Once | INTRAVENOUS | Status: AC

## 2024-03-12 NOTE — Patient Instructions (Signed)
 [facility] - A DEPT OF [facility]  Discharge Instructions: Thank you for choosing [facility] to provide your oncology and hematology care.   If you have a lab appointment with the Cancer Center, please go directly to the Cancer Center and check in at the registration area.   Wear comfortable clothing and clothing appropriate for easy access to any Portacath or PICC line.   We strive to give you quality time with your provider. You may need to reschedule your appointment if you arrive late (15 or more minutes).  Arriving late affects you and other patients whose appointments are after yours.  Also, if you miss three or more appointments without notifying the office, you may be dismissed from the clinic at the provider's discretion.      For prescription refill requests, have your pharmacy contact our office and allow 72 hours for refills to be completed.    Today you received the following chemotherapy and/or immunotherapy agent: Pembrolizumab (Keytruda)      To help prevent nausea and vomiting after your treatment, we encourage you to take your nausea medication as directed.  BELOW ARE SYMPTOMS THAT SHOULD BE REPORTED IMMEDIATELY: *FEVER GREATER THAN 100.4 F (38 C) OR HIGHER *CHILLS OR SWEATING *NAUSEA AND VOMITING THAT IS NOT CONTROLLED WITH YOUR NAUSEA MEDICATION *UNUSUAL SHORTNESS OF BREATH *UNUSUAL BRUISING OR BLEEDING *URINARY PROBLEMS (pain or burning when urinating, or frequent urination) *BOWEL PROBLEMS (unusual diarrhea, constipation, pain near the anus) TENDERNESS IN MOUTH AND THROAT WITH OR WITHOUT PRESENCE OF ULCERS (sore throat, sores in mouth, or a toothache) UNUSUAL RASH, SWELLING OR PAIN  UNUSUAL VAGINAL DISCHARGE OR ITCHING   Items with * indicate a potential emergency and should be followed up as soon as possible or go to the Emergency Department if any problems should occur.  Please show the CHEMOTHERAPY ALERT CARD or  IMMUNOTHERAPY ALERT CARD at check-in to the Emergency Department and triage nurse.  Should you have questions after your visit or need to cancel or reschedule your appointment, please contact [facility]  Dept: [phone]  and follow the prompts.  Office hours are 8:00 a.m. to 4:30 p.m. Monday - Friday. Please note that voicemails left after 4:00 p.m. may not be returned until the following business day.  We are closed weekends and major holidays. You have access to a nurse at all times for urgent questions. Please call the main number to the clinic Dept: [phone] and follow the prompts.   For any non-urgent questions, you may also contact your provider using MyChart. We now offer e-Visits for anyone 3 and older to request care online for non-urgent symptoms. For details visit [email or web address].   Also download the MyChart app! Go to the app store, search "MyChart", open the app, select [facility], and log in with your MyChart username and password. Pembrolizumab Injection What is this medication? PEMBROLIZUMAB (PEM broe LIZ ue mab) treats some types of cancer. It works by helping your immune system slow or stop the spread of cancer cells. It is a monoclonal antibody. This medicine may be used for other purposes; ask your health care provider or pharmacist if you have questions. COMMON BRAND NAME(S): Keytruda What should I tell my care team before I take this medication? They need to know if you have any of these conditions: Allogeneic stem cell transplant (uses someone else's stem cells)  Autoimmune diseases, such as Crohn disease, ulcerative colitis, lupus History of chest radiation Nervous system problems, such as Guillain-Barre syndrome, myasthenia gravis Organ transplant An unusual or allergic reaction to pembrolizumab, other medications, foods, dyes, or preservatives Pregnant or trying to get pregnant Breast-feeding How  should I use this medication? This medication is injected into a vein. It is given by your care team in a hospital or clinic setting. A special MedGuide will be given to you before each treatment. Be sure to read this information carefully each time. Talk to your care team about the use of this medication in children. While it may be prescribed for children as young as 6 months for selected conditions, precautions do apply. Overdosage: If you think you have taken too much of this medicine contact a poison control center or emergency room at once. NOTE: This medicine is only for you. Do not share this medicine with others. What if I miss a dose? Keep appointments for follow-up doses. It is important not to miss your dose. Call your care team if you are unable to keep an appointment. What may interact with this medication? Interactions have not been studied. This list may not describe all possible interactions. Give your health care provider a list of all the medicines, herbs, non-prescription drugs, or dietary supplements you use. Also tell them if you smoke, drink alcohol, or use illegal drugs. Some items may interact with your medicine. What should I watch for while using this medication? Your condition will be monitored carefully while you are receiving this medication. You may need blood work while taking this medication. This medication may cause serious skin reactions. They can happen weeks to months after starting the medication. Contact your care team right away if you notice fevers or flu-like symptoms with a rash. The rash may be red or purple and then turn into blisters or peeling of the skin. You may also notice a red rash with swelling of the face, lips, or lymph nodes in your neck or under your arms. Tell your care team right away if you have any change in your eyesight. Talk to your care team if you may be pregnant. Serious birth defects can occur if you take this medication during  pregnancy and for 4 months after the last dose. You will need a negative pregnancy test before starting this medication. Contraception is recommended while taking this medication and for 4 months after the last dose. Your care team can help you find the option that works for you. Do not breastfeed while taking this medication and for 4 months after the last dose. What side effects may I notice from receiving this medication? Side effects that you should report to your care team as soon as possible: Allergic reactions--skin rash, itching, hives, swelling of the face, lips, tongue, or throat Dry cough, shortness of breath or trouble breathing Eye pain, redness, irritation, or discharge with blurry or decreased vision Heart muscle inflammation--unusual weakness or fatigue, shortness of breath, chest pain, fast or irregular heartbeat, dizziness, swelling of the ankles, feet, or hands Hormone gland problems--headache, sensitivity to light, unusual weakness or fatigue, dizziness, fast or irregular heartbeat, increased sensitivity to cold or heat, excessive sweating, constipation, hair loss, increased thirst or amount of urine, tremors or shaking, irritability Infusion reactions--chest pain, shortness of breath or trouble breathing, feeling faint or lightheaded Kidney injury (glomerulonephritis)--decrease in the amount of urine, red or dark brown urine, foamy or bubbly urine, swelling of the ankles, hands, or feet  Liver injury--right upper belly pain, loss of appetite, nausea, light-colored stool, dark yellow or brown urine, yellowing skin or eyes, unusual weakness or fatigue Pain, tingling, or numbness in the hands or feet, muscle weakness, change in vision, confusion or trouble speaking, loss of balance or coordination, trouble walking, seizures Rash, fever, and swollen lymph nodes Redness, blistering, peeling, or loosening of the skin, including inside the mouth Sudden or severe stomach pain, bloody  diarrhea, fever, nausea, vomiting Side effects that usually do not require medical attention (report to your care team if they continue or are bothersome): Bone, joint, or muscle pain Diarrhea Fatigue Loss of appetite Nausea Skin rash This list may not describe all possible side effects. Call your doctor for medical advice about side effects. You may report side effects to FDA at 1-800-FDA-1088. Where should I keep my medication? This medication is given in a hospital or clinic. It will not be stored at home. NOTE: This sheet is a summary. It may not cover all possible information. If you have questions about this medicine, talk to your doctor, pharmacist, or health care provider.  2024 Elsevier/Gold Standard (2022-01-09 00:00:00)

## 2024-03-12 NOTE — Progress Notes (Signed)
 Palliative Medicine [facility]  Telephone:([phone]) [phone] Fax:(336) [phone]   Name: Virginia [name] Date: 03/12/2024 MRN: [id number]  DOB: 1955/10/25  Patient Care Team: Perri Ronal PARAS, MD as PCP - General (Internal Medicine) Federico Norleen ONEIDA MADISON, MD as PCP - Hematology/Oncology (Hematology and Oncology) Evertt Lonell BROCKS, RN as Oncology Nurse Navigator Marvine Almarie CROME, DO as Consulting Physician [facility] and Palliative Medicine) Lbcardiology, Rande, MD as Rounding Team (Cardiology) Pickenpack-Cousar, Fannie SAILOR, NP as Nurse Practitioner (Hospice and Palliative Medicine)   INTERVAL HISTORY: Virginia [name] is a 68 y.o. female with oncologic medical history including non-small cell lung cancer (09/2019) with metastatic disease to the bone and brain. Palliative ask to see for symptom management and goals of care.   SOCIAL HISTORY:     reports that she quit smoking about 11 years ago. Her smoking use included cigarettes. She has never used smokeless tobacco. She reports current alcohol use. She reports that she does not use drugs.  ADVANCE DIRECTIVES:  Advanced directives on file naming Virginia [name] and Virginia [name] as the primary and secondary decision makers should the patient become unable to speak for themselves.   CODE STATUS: Full code  PAST MEDICAL HISTORY: Past Medical History:  Diagnosis Date   Anemia    Anxiety    Concussion 09/28/2019   DVT (deep venous thrombosis) (HCC) 2021   L leg   Dyspnea    GERD (gastroesophageal reflux disease)    Hypercholesterolemia    per pt, she does not have elevated lipids   Hypertension    met lung ca dx'd 09/2019   mets to spine, hip and brain   PONV (postoperative nausea and vomiting)    Tobacco abuse     ALLERGIES:  is allergic to scopolamine .  MEDICATIONS:  Current Outpatient Medications  Medication Sig Dispense Refill   acetaminophen  (TYLENOL ) 325 MG tablet Take 1-2 tablets (325-650 mg total) by  mouth every 4 (four) hours as needed for mild pain (pain score 1-3). (Patient taking differently: Take 500 mg by mouth every 4 (four) hours as needed for mild pain (pain score 1-3).)     albuterol  (VENTOLIN  HFA) 108 (90 Base) MCG/ACT inhaler INHALE 2 PUFFS INTO THE LUNGS EVERY 6 HOURS AS NEEDED FOR WHEEZING OR SHORTNESS OF BREATH 6.7 g 11   ALPRAZolam  (XANAX ) 0.25 MG tablet Take 1 tablet (0.25 mg total) by mouth 2 (two) times daily as needed for anxiety. 30 tablet 0   Cholecalciferol  (VITAMIN D3) 125 MCG (5000 UT) TABS Take 5,000 Units by mouth daily.     colchicine  0.6 MG tablet Take 1 tablet (0.6 mg total) by mouth daily. 90 tablet 0   Cyanocobalamin  (VITAMIN B12 PO) Take 5,000 mcg by mouth daily. Gummies     dexamethasone  (DECADRON ) 1 MG tablet Take 1 tablet (1 mg total) by mouth daily. (Patient taking differently: Take 0.5 mg by mouth daily.) 90 tablet 0   furosemide  (LASIX ) 20 MG tablet Take 1 tablet (20 mg total) by mouth 2 (two) times a week. Tues & Thurs     hydrocortisone  (ANUSOL -HC) 2.5 % rectal cream use in rectal area 2 to 3 times a day as needed 30 g PRN   levETIRAcetam  (KEPPRA ) 1000 MG tablet Take 1 tablet (1,000 mg total) by mouth 2 (two) times daily. 180 tablet 2   magnesium  oxide (MAG-OX) 400 (240 Mg) MG tablet Take 1 tablet (400 mg total) by mouth 2 (two) times daily. 60 tablet 0  meclizine  (ANTIVERT ) 50 MG tablet Take 1 tablet (50 mg total) by mouth 3 (three) times daily. (Patient taking differently: Take 100 mg by mouth 3 (three) times daily. As needed) 90 tablet 2   metoprolol  succinate (TOPROL -XL) 25 MG 24 hr tablet Take 1 tablet (25 mg total) by mouth daily. 90 tablet 3   Multiple Vitamin (MULTIVITAMIN WITH MINERALS) TABS tablet Take 2 tablets by mouth daily. Gummy     Nutritional Supplements (,FEEDING SUPPLEMENT, PROSOURCE PLUS) liquid Take 30 mLs by mouth 2 (two) times daily between meals. (Patient taking differently: Take 30 mLs by mouth 4 (four) times daily. Berry flavor)      OLANZapine  (ZYPREXA ) 2.5 MG tablet Take 1 tablet (2.5 mg total) by mouth at bedtime. 90 tablet 1   ondansetron  (ZOFRAN ) 4 MG tablet TAKE 1 TABLET(4 MG) BY MOUTH EVERY 8 HOURS AS NEEDED FOR NAUSEA OR VOMITING 40 tablet 2   oxyCODONE  (OXY IR/ROXICODONE ) 5 MG immediate release tablet Take 1 tablet (5 mg total) by mouth every 4 (four) hours as needed for severe pain (pain score 7-10). 90 tablet 0   pantoprazole  (PROTONIX ) 40 MG tablet TAKE 1 TABLET(40 MG) BY MOUTH DAILY 90 tablet 3   polyethylene glycol (MIRALAX  / GLYCOLAX ) 17 g packet Take 17 g by mouth daily as needed for moderate constipation or mild constipation.     potassium chloride  SA (KLOR-CON  M) 20 MEQ tablet TAKE 1 TABLET(20 MEQ) BY MOUTH TWICE DAILY 90 tablet 1   senna-docusate (SENOKOT-S) 8.6-50 MG tablet Take 2 tablets by mouth daily as needed (Patient preference, Sennakot S for mild constipation). (Patient taking differently: Take 2 tablets by mouth daily as needed for mild constipation. 2 tabs 2 x daily)     spironolactone  (ALDACTONE ) 25 MG tablet Take 1 tablet (25 mg total) by mouth daily. 90 tablet 3   XTAMPZA  ER 13.5 MG C12A Take 1 capsule by mouth in the morning and at bedtime. 60 capsule 0   No current facility-administered medications for this visit.    VITAL SIGNS: There were no vitals taken for this visit. There were no vitals filed for this visit.   Estimated body mass index is 32.92 kg/m as calculated from the following:   Height as of 02/17/24: 5' 2 (1.575 m).   Weight as of an earlier encounter on 03/12/24: 180 lb (81.6 kg).   PERFORMANCE STATUS (ECOG) : 1 - Symptomatic but completely ambulatory Assessment NAD RRR Normal breathing pattern AAO x3  IMPRESSION: Discussed the use of AI scribe software for clinical note transcription with the patient, who gave verbal consent to proceed.  History of Present Illness Virginia [name] is a 68 year old female who was seen during infusion for follow-up. No acute  distress. No family present. She continues to do well. Her appetite is good, and she has gained weight recently. She humorously notes that she went on a diet and only lost one pound, which she found not worthwhile. Her constipation is well-managed, with her husband assisting by ensuring she stays hydrated, including giving her Gatorade mixed with Miralax  to manage this.   She experiences persistent dizziness, which she attributes to a past medical procedure. This dizziness is considered part of her baseline condition and does not represent a new or worsening symptom.  She reports severe pain over the last few days, which is alleviated by her pain medication. She has stopped taking Tylenol  due to safety concerns but occasionally uses it for headaches, which it effectively alleviates. She  is currently on Keytruda , an immunotherapy, and avoids NSAIDs like Advil  to prevent interference with her treatment.  Mrs. Matarazzo states her pain is well controlled on current regimen. She is taking Xtampza  every 12 hours and Oxycodone  every 4-6 hours as needed for breakthrough pain is effective. Does not require around the clock use of breakthrough medication. Some days she only requiring once. No adjustments made to regimen at this time.   All questions answered and support provided.   I discussed the importance of continued conversation with family and their medical providers regarding overall plan of care and treatment options, ensuring decisions are within the context of the patients values and GOCs.  Assessment & Plan Cancer under immunotherapy Cancer is well-controlled per patient.  - Continue Keytruda  therapy per Oncology.  - Avoid NSAIDs to prevent decreased efficacy.  Pain management Chronic pain managed with Xtampza  extended release and breakthrough medication. Pain levels are 6-7 out of 10 during visit. She reports requiring breakthrough medication use as needed depending on level of activities for the  day. Current regimen is manageable, though pain levels have recently increased. - Continue Xtampza  13.5mg  extended release every 12 hours. -Continue Oxycodone  every 4-6 hours as needed for breakthrough pain. - Monitor pain levels for consistency and severity. - Discuss potential adjustments to pain regimen if pain becomes more consistent and severe.  Constipation Constipation managed with Senokot twice daily and Miralax  as needed. Regular use is necessary to prevent difficulty with bowel movements. - Continue Senokot twice daily. - Use Miralax  as needed for constipation.  I will plan to follow-up with patient in 4-6 weeks. Sooner if needed.     Patient expressed understanding and was in agreement with this plan. She also understands that She can call the clinic at any time with any questions, concerns, or complaints.   Any controlled substances utilized were prescribed in the context of palliative care. PDMP has been reviewed.   Visit consisted of counseling and education dealing with the complex and emotionally intense issues of symptom management and palliative care in the setting of serious and potentially life-threatening illness.  Levon Borer, AGPCNP-BC  Palliative Medicine Team/[facility]

## 2024-03-12 NOTE — Progress Notes (Signed)
 [facility] [phone]   [phone]  PROGRESS NOTE  Patient Care Team: Perri Ronal PARAS, MD as PCP - General (Internal Medicine) Federico Norleen ONEIDA MADISON, MD as PCP - Hematology/Oncology (Hematology and Oncology) Evertt Lonell BROCKS, RN as Oncology Nurse Navigator Marvine Almarie CROME, DO as Consulting Physician [facility] and Palliative Medicine) Lbcardiology, Rande, MD as Rounding Team (Cardiology) Pickenpack-Cousar, Fannie SAILOR, NP as Nurse Practitioner (Hospice and Palliative Medicine)  Hematological/Oncological History # 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 CT of the head showed showed concerning for metastatic disease involving the left cerebellum and right occipital lobe.  2) 09/30/2019: MRI brain confirms mass within the left cerebellum measures 1.6 x 1.3 x 1.5 cm as well as at least 8 metastatic lesions within the supratentorial and infratentorial brain as outlined 3) 10/01/2019: PET CT scan revealed hypermetabolic infrahilar right lower lobe mass with hypermetabolic nodal metastases in the right hilum, mediastinum, right supraclavicular region, left axilla and lower left neck. 4) 10/08/2019: US  guided biopsy of the lymph nodes confirms poorly differentiated non-small cell carcinoma. Immunohistochemistry confirms adenocarcinoma of lung primary. PD-L1 and NGS later revealed a MET Exon 14 mutation and TPS of 90%.  5) 10/12/2019: Establish care with Dr. Federico  6) 11/04/2019: Day 1 of capmatinib 400mg  BID 7) 11/09/2019: presented to Rad/Onc with a DVT in LLE. Seen in symptom management clinic and started on apixaban .  8) 12/29/2019: CT C/A/P showed interval decrease in size of mass involving the superior segment of right lower lobe and reduction in mediastinal and left supraclavicular adenopathy though some small spinal lesions were noted in the interim between the two sets of  scans 9) 01/25/2020: temporarily stopped capmatinib due to symptoms of nausea/fatigue. Restarted therapy on 01/30/2020 after brief chemo holiday.  10) 03/22/2020: CT C/A/P reveals a mixed picture, with new lung nodule and increased lymphadenopathy, however response in bone lesions. D/c capmatinib therapy 11) 04/08/2020: start of Carbo/Pem/Pem for 2nd line treatment. Cycle 1 Day 1   12) 04/29/2020: Cycle 2 Day 1 Carbo/Pem/Pem  13) 05/20/2020: Cycle 3 Day 1 Carbo/Pem/Pem  14) 06/09/2020: Cycle 4 Day 1 Carbo/Pem/Pem  15) 06/29/2020: Cycle 5 Day 1 maintenance Pem/Pem  16) 07/20/2020: Cycle 6 Day 1 maintenance Pem/Pem  17) 08/12/2020: Cycle 7 Day 1 maintenance Pem/Pem  18) 09/01/2020: Cycle 8 Day 1 maintenance Pem/Pem  19) 09/21/2020: Cycle 9 Day 1 maintenance Pem/Pem  20) 10/13/2020: Cycle 10 Day 1 maintenance Pem/Pem  21) 11/03/2020: Cycle 11 Day 1 maintenance Pem/Pem  22) 11/25/2020:  Cycle 12 Day 1 maintenance Pem/Pem 23) 12/15/2020:  Cycle 13 Day 1 maintenance Pem/Pem 24) 01/05/2021:  Cycle 14 Day 1 maintenance Pem/Pem 25) 01/26/2021:  Cycle 15 Day 1 maintenance Pem/Pem plus Bevacizumab .  02/17/2021: Cycle 16 Day 1 maintenance Pem/Pem plus Bevacizumab .  03/10/2021: Cycle 17 Day 1 maintenance Pem/Pem plus Bevacizumab .  03/31/2021: Cycle 18 Day 1 maintenance Pem/Pem  04/20/2021:  Cycle 19 Day 1 maintenance Pem/Pem  05/12/2021: Cycle 20 Day 1 maintenance Pem/Pem 06/01/2021: Cycle 21 Day 1 maintenance Pem/Pem 06/22/2021: Cycle 22 Day 1 maintenance Pem/Pem 07/13/2021: Cycle 23 Day 1 maintenance Pem/Pem 08/16/2021: Cycle 24 Day 1 maintenance Pem/Pem 09/08/2021: Cycle 25 Day 1 maintenance Pem/Pem plus Bevacizumab  09/28/2021: Cycle 26 Day 1 maintenance Pem/Pem plus Bevacizumab  10/20/2021: Cycle 27 Day 1 maintenance Pem/Pem plus Bevacizumab  11/09/2021: Cycle 28 Day 1 maintenance Pem/Pem  12/06/2021: Cycle 29 Day 1 maintenance Pem/Pem  12/27/2021: Cycle  30 Day 1 maintenance Pem/Pem  01/18/2022: Cycle 31 Day 1 maintenance  Pem/Pem  02/07/2022: Cycle 32 Day 1 maintenance Pem/Pem  02/28/2022: Cycle 33 Day 1 maintenance Pem/Pem  03/23/2022: Cycle 34 Day 1 maintenance Pem/Pem  04/13/2022: Cycle 35 Day 1 maintenance Pem/Pem  05/04/2022: Cycle 36 Day 1 maintenance Pem/Pem  05/25/2022: Cycle 37 Day 1 maintenance Pem/Pem  06/14/2022: Cycle 38 Day 1 maintenance Pem/Pem 07/05/2022: Cycle 39 Day 1 maintenance Pem/Pem 08/13/2022: Cycle 40 Day 1 maintenance Pem/Pem 09/06/2022:Cycle 41 Day 1 maintenance Pem/Pem 09/27/2022: Cycle 42 Day 1 maintenance Pem/Pem 10/19/2022: Cycle 43 Day 1 maintenance Pem/Pem 11/09/2022: Cycle 44 Day 1 maintenance Pem/Pem 11/30/2022: Cycle 45 Day 1 maintenance Pem/Pem 12/20/2022: Cycle 46 Day 1 maintenance Pem/Pem 01/10/2023: Cycle 47 Day 1 maintenance Pem/Pem 01/31/2023:Cycle 48 Day 1 maintenance Pem/Pem 02/22/2023: Cycle 49 Day 1 maintenance Pem/Pem 03/15/2023: Cycle 50 Day 1 maintenance Pem/Pem 04/04/2023: Cycle 51 Day 1 maintenance Pem/Pem 04/26/2023: Cycle 52 Day 1 maintenance Pem/Pem 05/17/2023: Cycle 53 Day 1 maintenance Pem/Pem 07/19/2023: Cycle 54 Day 1 maintenance Pembro (holding Pemetrexed ) 08/12/2023: Cycle 55 Day 1 maintenance Pem/Pem 09/02/2023: Cycle 56 Day 1 maintenance Pembro (holding Pemetrexed ) 09/23/2023: Cycle 57 Day 1 maintenance Pembro (holding Pemetrexed ) 10/15/2023: Cycle 58 Day 1 maintenance Pembro (holding Pemetrexed ) 11/27/2023:  Cycle 59 Day 1 maintenance Pembro (holding Pemetrexed ) 01/10/2024: Cycle 60 Day 1 maintenance Pembro (holding Pemetrexed ) 01/31/2024: Cycle 61 Day 1 maintenance Pembro (holding Pemetrexed )  Interval History:  SINDHU NGUYEN 68 y.o. female with medical history significant for metastatic adenocarcinoma of the lung presents for a follow up visit. The patient's last visit was on 02/21/2024.  On exam today Mrs. Sarkisyan reports she has been taking it day by day in interim since her last visit.  She is not sure what she was doing for 4 July but will be taking it  easy indoors.  She notes that dizziness has been her primary issue.  She notes that she is not having any trouble with nausea, vomiting, or diarrhea.  She reports her appetite has been good.  She notes that she is trying to reduce her diet portions and is down 1 pound.  She and her husband both are dieting.  She reports some improvement in the swelling of her lower extremities as she is keeping them elevated when possible.  Overall she denies any fevers, chills, sweats, nausea, vomiting or diarrhea.  She is willing and able to proceed with pembrolizumab  therapy at this time.  MEDICAL HISTORY:  Past Medical History:  Diagnosis Date   Anemia    Anxiety    Concussion 09/28/2019   DVT (deep venous thrombosis) (HCC) 2021   L leg   Dyspnea    GERD (gastroesophageal reflux disease)    Hypercholesterolemia    per pt, she does not have elevated lipids   Hypertension    met lung ca dx'd 09/2019   mets to spine, hip and brain   PONV (postoperative nausea and vomiting)    Tobacco abuse     SURGICAL HISTORY: Past Surgical History:  Procedure Laterality Date   ABDOMINAL HYSTERECTOMY     partial/ left ovaries   APPLICATION OF CRANIAL NAVIGATION N/A 12/02/2020   Procedure: APPLICATION OF CRANIAL NAVIGATION;  Surgeon: [name], MD;  Location: [facility] OR;  Service: Neurosurgery;  Laterality: N/A;   CHOLECYSTECTOMY     CRANIOTOMY N/A 12/02/2020   Procedure: Posterior fossa craniotomy for tumor resection with brainlab;  Surgeon: [name], MD;  Location: [facility] OR;  Service: Neurosurgery;  Laterality: N/A;   DILATION AND CURETTAGE OF UTERUS     IR IMAGING GUIDED PORT INSERTION  10/23/2019   IR PATIENT EVAL TECH 0-60 MINS  11/21/2021   IR PATIENT EVAL TECH 0-60 MINS  11/24/2021   IR PATIENT EVAL TECH 0-60 MINS  11/29/2021   IR PATIENT EVAL TECH 0-60 MINS  12/04/2021   IR PATIENT EVAL TECH 0-60 MINS  12/12/2021   IR PATIENT EVAL TECH 0-60 MINS  12/19/2021   IR PATIENT EVAL TECH 0-60 MINS  12/27/2021   IR  PATIENT EVAL TECH 0-60 MINS  01/03/2022   IR PATIENT EVAL TECH 0-60 MINS  01/10/2022   IR PATIENT EVAL TECH 0-60 MINS  01/18/2022   IR PATIENT EVAL TECH 0-60 MINS  02/13/2022   IR PATIENT EVAL TECH 0-60 MINS  02/20/2022   IR RADIOLOGIST EVAL & MGMT  01/23/2022   IR REMOVAL TUN ACCESS W/ PORT W/O FL MOD SED  11/17/2021   KYPHOPLASTY N/A 03/15/2020   Procedure: Thoracic Eight KYPHOPLASTY;  Surgeon: [name], MD;  Location: [facility] OR;  Service: Neurosurgery;  Laterality: N/A;  prone    LIPOMA EXCISION  2018   removed under left breast and right thigh.   PERICARDIOCENTESIS N/A 12/10/2023   Procedure: PERICARDIOCENTESIS;  Surgeon: Wendel Lurena POUR, MD;  Location: [facility] INVASIVE CV LAB;  Service: Cardiovascular;  Laterality: N/A;   TUBAL LIGATION      ALLERGIES:  is allergic to scopolamine .  MEDICATIONS:  Current Outpatient Medications  Medication Sig Dispense Refill   acetaminophen  (TYLENOL ) 325 MG tablet Take 1-2 tablets (325-650 mg total) by mouth every 4 (four) hours as needed for mild pain (pain score 1-3). (Patient taking differently: Take 500 mg by mouth every 4 (four) hours as needed for mild pain (pain score 1-3).)     albuterol  (VENTOLIN  HFA) 108 (90 Base) MCG/ACT inhaler INHALE 2 PUFFS INTO THE LUNGS EVERY 6 HOURS AS NEEDED FOR WHEEZING OR SHORTNESS OF BREATH 6.7 g 11   ALPRAZolam  (XANAX ) 0.25 MG tablet Take 1 tablet (0.25 mg total) by mouth 2 (two) times daily as needed for anxiety. 30 tablet 0   Cholecalciferol  (VITAMIN D3) 125 MCG (5000 UT) TABS Take 5,000 Units by mouth daily.     colchicine  0.6 MG tablet Take 1 tablet (0.6 mg total) by mouth daily. 90 tablet 0   Cyanocobalamin  (VITAMIN B12 PO) Take 5,000 mcg by mouth daily. Gummies     dexamethasone  (DECADRON ) 1 MG tablet Take 1 tablet (1 mg total) by mouth daily. (Patient taking differently: Take 0.5 mg by mouth daily.) 90 tablet 0   furosemide  (LASIX ) 20 MG tablet Take 1 tablet (20 mg total) by mouth 2 (two) times a week. Tues & Thurs      hydrocortisone  (ANUSOL -HC) 2.5 % rectal cream use in rectal area 2 to 3 times a day as needed 30 g PRN   levETIRAcetam  (KEPPRA ) 1000 MG tablet Take 1 tablet (1,000 mg total) by mouth 2 (two) times daily. 180 tablet 2   magnesium  oxide (MAG-OX) 400 (240 Mg) MG tablet Take 1 tablet (400 mg total) by mouth 2 (two) times daily. 60 tablet 0   meclizine  (ANTIVERT ) 50 MG tablet Take 1 tablet (50 mg total) by mouth 3 (three) times daily. (Patient taking differently: Take 100 mg by mouth 3 (three) times daily. As needed) 90 tablet 2   metoprolol  succinate (TOPROL -XL) 25 MG 24 hr tablet Take 1 tablet (25 mg total) by mouth daily. 90 tablet 3  Multiple Vitamin (MULTIVITAMIN WITH MINERALS) TABS tablet Take 2 tablets by mouth daily. Gummy     Nutritional Supplements (,FEEDING SUPPLEMENT, PROSOURCE PLUS) liquid Take 30 mLs by mouth 2 (two) times daily between meals. (Patient taking differently: Take 30 mLs by mouth 4 (four) times daily. Berry flavor)     OLANZapine  (ZYPREXA ) 2.5 MG tablet Take 1 tablet (2.5 mg total) by mouth at bedtime. 90 tablet 1   ondansetron  (ZOFRAN ) 4 MG tablet TAKE 1 TABLET(4 MG) BY MOUTH EVERY 8 HOURS AS NEEDED FOR NAUSEA OR VOMITING 40 tablet 2   oxyCODONE  (OXY IR/ROXICODONE ) 5 MG immediate release tablet Take 1 tablet (5 mg total) by mouth every 4 (four) hours as needed for severe pain (pain score 7-10). 90 tablet 0   pantoprazole  (PROTONIX ) 40 MG tablet TAKE 1 TABLET(40 MG) BY MOUTH DAILY 90 tablet 3   polyethylene glycol (MIRALAX  / GLYCOLAX ) 17 g packet Take 17 g by mouth daily as needed for moderate constipation or mild constipation.     potassium chloride  SA (KLOR-CON  M) 20 MEQ tablet TAKE 1 TABLET(20 MEQ) BY MOUTH TWICE DAILY 90 tablet 1   senna-docusate (SENOKOT-S) 8.6-50 MG tablet Take 2 tablets by mouth daily as needed (Patient preference, Sennakot S for mild constipation). (Patient taking differently: Take 2 tablets by mouth daily as needed for mild constipation. 2 tabs 2 x daily)      spironolactone  (ALDACTONE ) 25 MG tablet Take 1 tablet (25 mg total) by mouth daily. 90 tablet 3   XTAMPZA  ER 13.5 MG C12A Take 1 capsule by mouth in the morning and at bedtime. 60 capsule 0   No current facility-administered medications for this visit.    REVIEW OF SYSTEMS:   Constitutional: ( - ) fevers, ( - )  chills , ( - ) night sweats Eyes: ( - ) blurriness of vision, ( - ) double vision, ( - ) watery eyes Ears, nose, mouth, throat, and face: ( - ) mucositis, ( - ) sore throat Respiratory: ( - ) cough, ( + ) dyspnea, ( - ) wheezes Cardiovascular: ( - ) palpitation, ( - ) chest discomfort, ( - ) lower extremity swelling Gastrointestinal:  ( + ) nausea, ( - ) heartburn, ( - ) change in bowel habits Skin: ( - ) abnormal skin rashes Lymphatics: ( - ) new lymphadenopathy, ( - ) easy bruising Neurological: ( - ) numbness, ( - ) tingling, ( - ) new weaknesses Behavioral/Psych: ( - ) mood change, ( - ) new changes  All other systems were reviewed with the patient and are negative.  PHYSICAL EXAMINATION: ECOG PERFORMANCE STATUS: 2 - Symptomatic, <50% confined to bed  Vitals:   03/12/24 1225  BP: 118/76  Pulse: 69  Resp: 13  Temp: (!) 97.5 F (36.4 C)  SpO2: 100%    Filed Weights   03/12/24 1225  Weight: 180 lb (81.6 kg)      GENERAL: well appearing middle aged Caucasian female in NAD  SKIN: skin color, texture, turgor are normal, no rashes or significant lesions EYES: conjunctiva are pink and non-injected, sclera clear LUNGS: wheezing at lung bases. clear to auscultation and percussion with normal breathing effort HEART: regular rate & rhythm and no murmurs and bilateral trace pitting lower extremity edema Musculoskeletal: no cyanosis of digits and no clubbing PSYCH: alert & oriented x 3, fluent speech NEURO: no focal motor/sensory deficits  LABORATORY DATA:  I have reviewed the data as listed    Latest Ref Rng &  Units 03/12/2024   11:12 AM 02/21/2024    2:19 PM  01/31/2024   11:21 AM  CBC  WBC 4.0 - 10.5 K/uL 8.2  10.3  8.0   Hemoglobin 12.0 - 15.0 g/dL 87.2  86.8  86.5   Hematocrit 36.0 - 46.0 % 37.2  39.2  38.7   Platelets 150 - 400 K/uL 214  225  259        Latest Ref Rng & Units 03/12/2024   11:12 AM 02/21/2024    2:19 PM 01/31/2024   11:21 AM  CMP  Glucose 70 - 99 mg/dL 91  888  85   BUN 8 - 23 mg/dL 38  38  42   Creatinine 0.44 - 1.00 mg/dL 9.06  9.01  8.92   Sodium 135 - 145 mmol/L 143  142  142   Potassium 3.5 - 5.1 mmol/L 3.9  4.1  3.9   Chloride 98 - 111 mmol/L 106  106  108   CO2 22 - 32 mmol/L 32  30  28   Calcium  8.9 - 10.3 mg/dL 9.3  9.1  9.4   Total Protein 6.5 - 8.1 g/dL 6.3  6.2  6.5   Total Bilirubin 0.0 - 1.2 mg/dL 0.4  0.4  0.3   Alkaline Phos 38 - 126 U/L 41  39  39   AST 15 - 41 U/L 13  12  12    ALT 0 - 44 U/L 14  12  12       RADIOGRAPHIC STUDIES: I personally have viewed the radiographic studies below: No results found.    ASSESSMENT & PLAN EOWYN TABONE is a 68 y.o. female with medical history significant for metastatic adenocarcinoma of the lung presents for a follow up visit. 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   Previously we discussed Carboplatin /Pembrolizumab /Pemetrexed  and the expected side effect from these medications.  We discussed the possible side effects of immunotherapy including colitis, hepatitis, dermatitis, and pneumonitis.  Additionally we discussed the side effects of carboplatin  which include suppression of blood counts, nephrotoxicity, and nausea.  Additionally we discussed pemetrexed  which can also have effects on counts and would require supplementation with vitamin B12 and folic acid .  She voiced her understanding of these side effects and treatment plans moving forward.  I also noted that we would be able to drop the chemotherapy agents that she had intolerance to continue with pembrolizumab  alone given her high TPS score.  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 Due to worsening dizziness, she resumed this from Cycle 25-27.  PLAN: --Due for Cycle 63, Day 1 of maintenance single agent Pembrolizumab  today.  --Labs from today were reviewed and adequate for treatment.  Labs today show WBC 8.2, Hgb 12.7, MCV 94.7, Plt 214, creatinine overall stable and LFTs in range. --Last CT scan in 02/21/2024 showed no evidence of progressive disease. Next scan due around August 2025.  --RTC q 3 weeks for labs, follow up visit prior to Cycle 64.  #Brain Metastasis --appreciate the assistance of Dr. Buckley in treating her brain metastasis and symptoms. --radiation therapy to the brain completed on 11/16/2019.  --MRI brain on 04/29/2020 showed evidence of small brain bleed. Eliquis  therapy was discontinued.  --MRI on 11/04/2020 showed concern for  progression of intracranial disease.  --On 12/02/2020, patient underwent craniotomy, resection of progressive cerebellar mass by Dr. Unice. 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  --MRI on 12/23/2020 showed evidence of radiation necrosis. Dr. Buckley evaluated the patient on 01/02/2021 with recommendations to add IV Bevacizumab  10 mg/kg q 3 weeks ( started 01/26/2021). Completed a full course of this therapy. --Patient was evaluated by Dr. Buckley on 08/29/2021 and recommended to resume Bevacizumab  due to worsening dizziness.  Which was given from Cycle 25-27.  Plan: --continue to follow with Dr. Buckley  --Most recent MRI from 12/18/2023 showed evidence of intracranial bleeding.  Eliquis  held indefinitely.  --Next MRI brain scheduled for 03/19/2024.   #Nausea/Vomiting, worsening --Regimen includes Zofran  4-8mg  q8h PRN and compazine  10mg  q6H for breakthrough PRN, Olanzpaine 2.5 mg PO QHS to help with  nausea.  She is currently using Phenergan  for breakthrough. --continue to monitor   #Hypokalemia --likely 2/2 to decreased PO intake, hypovolemia, and BP medications --Potassium level 3.9 today.   --Currently on potassium chloride  tablets, 40 mEq PO potassium in AM and 20mEq in PM.   #Pain Control, stable --continue oxycodone  5mg  q4H PRN. Can increase dose to 10mg  q6H if pain is severe. She is taking approximately 3-4 pills per day.  --continue Xtampza  13.5 mg ER BID for long acting support.  --continue to take with Senokot to prevent opioid induced constipation. 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    # Hypertension: --Continue Norvasc  to 10 mg daily.  --continue to monitor at home and in clinic.    No orders of the defined types were placed in this encounter.   All questions were answered. The patient knows to call the clinic with any problems, questions or concerns.  I have spent a total of 30 minutes minutes of face-to-face and non-face-to-face time, preparing to see the patient, performing a medically appropriate examination, counseling and educating the patient,  documenting clinical information in the electronic health record, and care coordination.   Norleen IVAR Kidney, MD Department of Hematology/Oncology [facility] at [facility] Phone: [phone] Pager: [phone] Email: [email or web address]   03/12/2024 5:09 PM   Literature Support:  Lucienne CROME, Rodrguez-Abreu D, Gadgeel S, Esteban E, Felip E, De Angelis F, Domine M, Clingan P, Hochmair MJ, [place], Cheng SY, Bischoff HG, Peled N, Grossi F, Jennens RR, Reck M, [place], [place], [place], Rubio-Viqueira B, Novello S, Kurata T, Gray JE, Vida J, Wei Z, Yang J, Raftopoulos H, [place], [place] [employer]; [id number] Investigators.  Pembrolizumab  plus Chemotherapy in Metastatic Non-Small-Cell Lung Cancer. LOISE Diedra PARAS Med. 2018 May 31;378(22):2078-2092.  --Median progression-free survival was 8.8 months (95% CI, 7.6 to 9.2) in the pembrolizumab -combination group and 4.9 months (95% CI, 4.7 to 5.5) in the placebo-combination group (hazard ratio for disease progression or death, 0.52; 95% CI, 0.43 to 0.64; P<0.001). Adverse events of grade 3 or higher occurred in 67.2% of the patients in the pembrolizumab -combination group and in 65.8% of those in the placebo-combination group.  Missy KATHEE Diona CROME Cesario OLEGARIO Patt L. Efficacy of pemetrexed -based regimens in advanced non-small cell lung cancer patients with activating epidermal growth factor receptor mutations after tyrosine kinase inhibitor failure: a systematic review. Onco Targets Ther. 2018;11:2121-2129.   --The weighted median PFS, median OS, and ORR for patients treated with pem regimens were 5.09 months, 15.91  months, and 30.19%, respectively. Our systematic review results showed a favorable efficacy profile of pem regimens in NSCLC patients with EGFR mutation after EGFR-TKI failure.

## 2024-03-18 ENCOUNTER — Encounter: Payer: Self-pay | Admitting: Hematology and Oncology

## 2024-03-18 ENCOUNTER — Other Ambulatory Visit: Payer: Self-pay

## 2024-03-18 DIAGNOSIS — G893 Neoplasm related pain (acute) (chronic): Secondary | ICD-10-CM

## 2024-03-18 DIAGNOSIS — Z515 Encounter for palliative care: Secondary | ICD-10-CM

## 2024-03-18 DIAGNOSIS — C7951 Secondary malignant neoplasm of bone: Secondary | ICD-10-CM

## 2024-03-18 MED ORDER — OXYCODONE HCL 5 MG PO TABS: 5.0000 mg | ORAL_TABLET | ORAL | 0 refills | Status: DC | PRN

## 2024-03-18 MED ORDER — XTAMPZA ER 13.5 MG PO C12A: 1.0000 | EXTENDED_RELEASE_CAPSULE | Freq: Two times a day (BID) | ORAL | 0 refills | Status: DC

## 2024-03-19 ENCOUNTER — Telehealth: Payer: Self-pay

## 2024-03-19 ENCOUNTER — Ambulatory Visit
Admission: RE | Admit: 2024-03-19 | Discharge: 2024-03-19 | Disposition: A | Discharge status: Home / Self Care | Source: Ambulatory Visit | Attending: Internal Medicine | Admitting: Internal Medicine

## 2024-03-19 DIAGNOSIS — I6789 Other cerebrovascular disease: Secondary | ICD-10-CM

## 2024-03-19 DIAGNOSIS — C7931 Secondary malignant neoplasm of brain: Secondary | ICD-10-CM

## 2024-03-19 MED ORDER — GADOPICLENOL 0.5 MMOL/ML IV SOLN
10.0000 mL | Freq: Once | INTRAVENOUS | Status: AC | PRN
  Administered 2024-03-19: 8 mL via INTRAVENOUS

## 2024-03-19 NOTE — Telephone Encounter (Signed)
 They need to fax an order over.   Copied from CRM [id number]. Topic: Clinical - Home Health Verbal Orders >> Mar 09, 2024  5:03 PM Tiffini S wrote: Caller/Agency: [facility]  Callback Number: [phone] Service Requested: Skilled Nursing Frequency: twice weekly  Any new concerns about the patient? Yes, for bathing order. Please call Darina

## 2024-03-20 ENCOUNTER — Other Ambulatory Visit: Payer: Self-pay | Admitting: Radiation Therapy

## 2024-03-23 ENCOUNTER — Inpatient Hospital Stay (HOSPITAL_BASED_OUTPATIENT_CLINIC_OR_DEPARTMENT_OTHER): Admitting: Internal Medicine

## 2024-03-23 ENCOUNTER — Inpatient Hospital Stay

## 2024-03-23 ENCOUNTER — Encounter: Payer: Self-pay | Admitting: Internal Medicine

## 2024-03-23 DIAGNOSIS — C7931 Secondary malignant neoplasm of brain: Secondary | ICD-10-CM | POA: Diagnosis not present

## 2024-03-23 DIAGNOSIS — C3490 Malignant neoplasm of unspecified part of unspecified bronchus or lung: Secondary | ICD-10-CM | POA: Diagnosis not present

## 2024-03-23 NOTE — Progress Notes (Signed)
 I connected with Virginia [name] on 03/23/24 at 12:00 PM EDT by telephone visit and verified that I am speaking with the correct person using two identifiers.  I discussed the limitations, risks, security and privacy concerns of performing an evaluation and management service by telemedicine and the availability of in-person appointments. I also discussed with the patient that there may be a patient responsible charge related to this service. The patient expressed understanding and agreed to proceed.   Other persons participating in the visit and their role in the encounter:  husband  Patient's location:  Home Provider's location:  Office Chief Complaint:  Lung cancer metastatic to brain [facility])  History of Present Ilness: Virginia [name] reports no significant changes with her dizziness.  She is bearing weight minimally, mostly in wheelchair currently.  Doing seated exercises with PT and OT.  No other new or progressive changes.    Observations: Language and cognition at baseline  Imaging:  MR BRAIN W WO CONTRAST Result Date: 03/19/2024 EXAM: MRI BRAIN WITH AND WITHOUT CONTRAST 03/19/2024 03:21:03 PM TECHNIQUE: Multiplanar multisequence MRI of the head/brain was performed with and without the administration of intravenous contrast. COMPARISON: 12/18/2023 and 10/08/2023. CLINICAL HISTORY: Brain/CNS neoplasm, assess treatment response. Adenocarcinoma of the lung. Hemorrhagic brain metastases. FINDINGS: BRAIN AND VENTRICLES: Numerous hemorrhagic enhancing brain metastases are again noted bilaterally. The hemorrhagic lesion in the left occipital lobe has returned to the dimensions of the 10/08/2023 exam, measuring 2.5 x 2.5 cm on sagittal image 25 of series 16. A right parietal lesion has increased in size, now measuring 2.0 x 3.0 x 2.5 cm. Surrounding vasogenic edema in the right parietal and posterior frontal lobe has increased as well. A lesion of the right occipital lobe measuring 12.5 x 7.5 mm on  image 90 of series 13 is slightly larger than on the prior studies. Multiple other hemorrhagic enhancing lesions bilaterally are stable. No new lesions are present. Mass effect on the right lateral ventricle has increased. ORBITS: No acute abnormality. SINUSES: No acute abnormality. BONES AND SOFT TISSUES: Normal bone marrow signal and enhancement. No acute soft tissue abnormality. IMPRESSION: 1. Numerous hemorrhagic enhancing brain metastases bilaterally, with interval increase in size of the right parietal lesion (now 2.0 x 3.0 x 2.5 cm) and right occipital lesion (now 12.5 x 7.5 mm), as well as increased surrounding vasogenic edema in the right parietal and posterior frontal lobe. 2. The left occipital lesion has decreased in size, now measuring 2.5 x 2.5 cm, similar to the 10/08/2023 exam. 3. No new lesions. 4. Increased mass effect on the right lateral ventricle. Electronically signed by: Lonni Necessary MD 03/19/2024 06:04 PM EDT RP Workstation: [id number]   Assessment and Plan: Lung cancer metastatic to brain [facility])  Clinically stable today.  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  There is not a significant change or progression of disease overall.  Case was reviewed in CNS tumor board with Dr. Necessary this morning.  Otherwise, we would recommend re-imaging with brain MRI in 6 months, or sooner again if new symptoms develop.  Follow Up Instructions: We ask that Virginia [name] return to clinic in 6 months following next brain MRI, or sooner as needed.  I discussed the assessment and treatment plan with the patient.  The patient was provided an opportunity to ask questions and all were answered.  The patient agreed with the plan and demonstrated understanding of the instructions.    The patient was advised  to call back or seek an in-person evaluation if the symptoms worsen or if the condition fails to improve as anticipated.     Ilamae Geng K Jerlyn Pain, MD   I provided 20 minutes of non face-to-face telephone visit time during this encounter, and > 50% was spent counseling as documented under my assessment & plan.

## 2024-04-02 ENCOUNTER — Telehealth: Payer: Self-pay | Admitting: *Deleted

## 2024-04-02 ENCOUNTER — Encounter: Payer: Self-pay | Admitting: Internal Medicine

## 2024-04-02 ENCOUNTER — Encounter: Payer: Self-pay | Admitting: Hematology and Oncology

## 2024-04-02 NOTE — Telephone Encounter (Signed)
 See previous note

## 2024-04-02 NOTE — Telephone Encounter (Signed)
 TCT patient in response to her husband's MY Chart message. He is concerned about a change in her breathing, that she needs to being sitting upright most of the time to be able to breath more comfortably. He does say that she really does not want to go to the ED. She has an appointment here tomorrow with Dr. Federico. He is fine with waiting until tomorrow to have her breathing assessed. Dr. Federico is aware and is in agreement.

## 2024-04-03 ENCOUNTER — Inpatient Hospital Stay

## 2024-04-03 ENCOUNTER — Inpatient Hospital Stay: Admitting: Hematology and Oncology

## 2024-04-03 VITALS — BP 106/80 | HR 67 | Temp 97.9°F | Resp 16 | Wt 185.2 lb

## 2024-04-03 DIAGNOSIS — Z5112 Encounter for antineoplastic immunotherapy: Secondary | ICD-10-CM | POA: Diagnosis not present

## 2024-04-03 DIAGNOSIS — C7931 Secondary malignant neoplasm of brain: Secondary | ICD-10-CM

## 2024-04-03 DIAGNOSIS — R918 Other nonspecific abnormal finding of lung field: Secondary | ICD-10-CM

## 2024-04-03 DIAGNOSIS — G893 Neoplasm related pain (acute) (chronic): Secondary | ICD-10-CM | POA: Diagnosis not present

## 2024-04-03 DIAGNOSIS — C3490 Malignant neoplasm of unspecified part of unspecified bronchus or lung: Secondary | ICD-10-CM

## 2024-04-03 LAB — CBC WITH DIFFERENTIAL (CANCER CENTER ONLY)
Abs Immature Granulocytes: 0.09 K/uL — ABNORMAL HIGH (ref 0.00–0.07)
Basophils Absolute: 0.1 K/uL (ref 0.0–0.1)
Basophils Relative: 1 %
Eosinophils Absolute: 0.1 K/uL (ref 0.0–0.5)
Eosinophils Relative: 2 %
HCT: 38.8 % (ref 36.0–46.0)
Hemoglobin: 13.2 g/dL (ref 12.0–15.0)
Immature Granulocytes: 1 %
Lymphocytes Relative: 26 %
Lymphs Abs: 1.9 K/uL (ref 0.7–4.0)
MCH: 32.1 pg (ref 26.0–34.0)
MCHC: 34 g/dL (ref 30.0–36.0)
MCV: 94.4 fL (ref 80.0–100.0)
Monocytes Absolute: 0.9 K/uL (ref 0.1–1.0)
Monocytes Relative: 12 %
Neutro Abs: 4.4 K/uL (ref 1.7–7.7)
Neutrophils Relative %: 58 %
Platelet Count: 232 K/uL (ref 150–400)
RBC: 4.11 MIL/uL (ref 3.87–5.11)
RDW: 12.7 % (ref 11.5–15.5)
WBC Count: 7.5 K/uL (ref 4.0–10.5)
nRBC: 0 % (ref 0.0–0.2)

## 2024-04-03 LAB — CMP (CANCER CENTER ONLY)
ALT: 21 U/L (ref 0–44)
AST: 16 U/L (ref 15–41)
Albumin: 3.4 g/dL — ABNORMAL LOW (ref 3.5–5.0)
Alkaline Phosphatase: 47 U/L (ref 38–126)
Anion gap: 5 (ref 5–15)
BUN: 40 mg/dL — ABNORMAL HIGH (ref 8–23)
CO2: 29 mmol/L (ref 22–32)
Calcium: 9.1 mg/dL (ref 8.9–10.3)
Chloride: 107 mmol/L (ref 98–111)
Creatinine: 1 mg/dL (ref 0.44–1.00)
GFR, Estimated: >60 mL/min (ref >60)
Glucose, Bld: 87 mg/dL (ref 70–99)
Potassium: 4.2 mmol/L (ref 3.5–5.1)
Sodium: 141 mmol/L (ref 135–145)
Total Bilirubin: 0.3 mg/dL (ref 0.0–1.2)
Total Protein: 6.4 g/dL — ABNORMAL LOW (ref 6.5–8.1)

## 2024-04-03 LAB — TSH: TSH: 1.82 u[IU]/mL (ref 0.350–4.500)

## 2024-04-03 MED ORDER — SODIUM CHLORIDE 0.9 % IV SOLN: Freq: Once | INTRAVENOUS | Status: AC

## 2024-04-03 MED ORDER — SODIUM CHLORIDE 0.9 % IV SOLN
200.0000 mg | Freq: Once | INTRAVENOUS | Status: AC
  Administered 2024-04-03: 200 mg via INTRAVENOUS
  Filled 2024-04-03: qty 200

## 2024-04-03 NOTE — Patient Instructions (Signed)
 [facility] - A DEPT OF [facility]  Discharge Instructions: Thank you for choosing [facility] to provide your oncology and hematology care.   If you have a lab appointment with the Cancer Center, please go directly to the Cancer Center and check in at the registration area.   Wear comfortable clothing and clothing appropriate for easy access to any Portacath or PICC line.   We strive to give you quality time with your provider. You may need to reschedule your appointment if you arrive late (15 or more minutes).  Arriving late affects you and other patients whose appointments are after yours.  Also, if you miss three or more appointments without notifying the office, you may be dismissed from the clinic at the provider's discretion.      For prescription refill requests, have your pharmacy contact our office and allow 72 hours for refills to be completed.    Today you received the following chemotherapy and/or immunotherapy agents: pembrolizumab      To help prevent nausea and vomiting after your treatment, we encourage you to take your nausea medication as directed.  BELOW ARE SYMPTOMS THAT SHOULD BE REPORTED IMMEDIATELY: *FEVER GREATER THAN 100.4 F (38 C) OR HIGHER *CHILLS OR SWEATING *NAUSEA AND VOMITING THAT IS NOT CONTROLLED WITH YOUR NAUSEA MEDICATION *UNUSUAL SHORTNESS OF BREATH *UNUSUAL BRUISING OR BLEEDING *URINARY PROBLEMS (pain or burning when urinating, or frequent urination) *BOWEL PROBLEMS (unusual diarrhea, constipation, pain near the anus) TENDERNESS IN MOUTH AND THROAT WITH OR WITHOUT PRESENCE OF ULCERS (sore throat, sores in mouth, or a toothache) UNUSUAL RASH, SWELLING OR PAIN  UNUSUAL VAGINAL DISCHARGE OR ITCHING   Items with * indicate a potential emergency and should be followed up as soon as possible or go to the Emergency Department if any problems should occur.  Please show the CHEMOTHERAPY ALERT CARD or  IMMUNOTHERAPY ALERT CARD at check-in to the Emergency Department and triage nurse.  Should you have questions after your visit or need to cancel or reschedule your appointment, please contact [facility] - A DEPT OF [facility]  Dept: [phone]  and follow the prompts.  Office hours are 8:00 a.m. to 4:30 p.m. Monday - Friday. Please note that voicemails left after 4:00 p.m. may not be returned until the following business day.  We are closed weekends and major holidays. You have access to a nurse at all times for urgent questions. Please call the main number to the clinic Dept: [phone] and follow the prompts.   For any non-urgent questions, you may also contact your provider using MyChart. We now offer e-Visits for anyone 78 and older to request care online for non-urgent symptoms. For details visit [email or web address].   Also download the MyChart app! Go to the app store, search "MyChart", open the app, select [place], and log in with your MyChart username and password.

## 2024-04-03 NOTE — Progress Notes (Signed)
 [facility] [phone]   [phone]  PROGRESS NOTE  Patient Care Team: Perri Ronal PARAS, MD as PCP - General (Internal Medicine) Federico Norleen ONEIDA MADISON, MD as PCP - Hematology/Oncology (Hematology and Oncology) Evertt Lonell BROCKS, RN as Oncology Nurse Navigator Marvine Almarie CROME, DO as Consulting Physician [facility] and Palliative Medicine) Lbcardiology, Rande, MD as Rounding Team (Cardiology) Pickenpack-Cousar, Fannie SAILOR, NP as Nurse Practitioner (Hospice and Palliative Medicine)  Hematological/Oncological History # 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 CT of the head showed showed concerning for metastatic disease involving the left cerebellum and right occipital lobe.  2) 09/30/2019: MRI brain confirms mass within the left cerebellum measures 1.6 x 1.3 x 1.5 cm as well as at least 8 metastatic lesions within the supratentorial and infratentorial brain as outlined 3) 10/01/2019: PET CT scan revealed hypermetabolic infrahilar right lower lobe mass with hypermetabolic nodal metastases in the right hilum, mediastinum, right supraclavicular region, left axilla and lower left neck. 4) 10/08/2019: US  guided biopsy of the lymph nodes confirms poorly differentiated non-small cell carcinoma. Immunohistochemistry confirms adenocarcinoma of lung primary. PD-L1 and NGS later revealed a MET Exon 14 mutation and TPS of 90%.  5) 10/12/2019: Establish care with Dr. Federico  6) 11/04/2019: Day 1 of capmatinib 400mg  BID 7) 11/09/2019: presented to Rad/Onc with a DVT in LLE. Seen in symptom management clinic and started on apixaban .  8) 12/29/2019: CT C/A/P showed interval decrease in size of mass involving the superior segment of right lower lobe and reduction in mediastinal and left supraclavicular adenopathy though some small spinal lesions were noted in the interim between the two sets of  scans 9) 01/25/2020: temporarily stopped capmatinib due to symptoms of nausea/fatigue. Restarted therapy on 01/30/2020 after brief chemo holiday.  10) 03/22/2020: CT C/A/P reveals a mixed picture, with new lung nodule and increased lymphadenopathy, however response in bone lesions. D/c capmatinib therapy 11) 04/08/2020: start of Carbo/Pem/Pem for 2nd line treatment. Cycle 1 Day 1   12) 04/29/2020: Cycle 2 Day 1 Carbo/Pem/Pem  13) 05/20/2020: Cycle 3 Day 1 Carbo/Pem/Pem  14) 06/09/2020: Cycle 4 Day 1 Carbo/Pem/Pem  15) 06/29/2020: Cycle 5 Day 1 maintenance Pem/Pem  16) 07/20/2020: Cycle 6 Day 1 maintenance Pem/Pem  17) 08/12/2020: Cycle 7 Day 1 maintenance Pem/Pem  18) 09/01/2020: Cycle 8 Day 1 maintenance Pem/Pem  19) 09/21/2020: Cycle 9 Day 1 maintenance Pem/Pem  20) 10/13/2020: Cycle 10 Day 1 maintenance Pem/Pem  21) 11/03/2020: Cycle 11 Day 1 maintenance Pem/Pem  22) 11/25/2020:  Cycle 12 Day 1 maintenance Pem/Pem 23) 12/15/2020:  Cycle 13 Day 1 maintenance Pem/Pem 24) 01/05/2021:  Cycle 14 Day 1 maintenance Pem/Pem 25) 01/26/2021:  Cycle 15 Day 1 maintenance Pem/Pem plus Bevacizumab .  02/17/2021: Cycle 16 Day 1 maintenance Pem/Pem plus Bevacizumab .  03/10/2021: Cycle 17 Day 1 maintenance Pem/Pem plus Bevacizumab .  03/31/2021: Cycle 18 Day 1 maintenance Pem/Pem  04/20/2021:  Cycle 19 Day 1 maintenance Pem/Pem  05/12/2021: Cycle 20 Day 1 maintenance Pem/Pem 06/01/2021: Cycle 21 Day 1 maintenance Pem/Pem 06/22/2021: Cycle 22 Day 1 maintenance Pem/Pem 07/13/2021: Cycle 23 Day 1 maintenance Pem/Pem 08/16/2021: Cycle 24 Day 1 maintenance Pem/Pem 09/08/2021: Cycle 25 Day 1 maintenance Pem/Pem plus Bevacizumab  09/28/2021: Cycle 26 Day 1 maintenance Pem/Pem plus Bevacizumab  10/20/2021: Cycle 27 Day 1 maintenance Pem/Pem plus Bevacizumab  11/09/2021: Cycle 28 Day 1 maintenance Pem/Pem  12/06/2021: Cycle 29 Day 1 maintenance Pem/Pem  12/27/2021: Cycle  30 Day 1 maintenance Pem/Pem  01/18/2022: Cycle 31 Day 1 maintenance  Pem/Pem  02/07/2022: Cycle 32 Day 1 maintenance Pem/Pem  02/28/2022: Cycle 33 Day 1 maintenance Pem/Pem  03/23/2022: Cycle 34 Day 1 maintenance Pem/Pem  04/13/2022: Cycle 35 Day 1 maintenance Pem/Pem  05/04/2022: Cycle 36 Day 1 maintenance Pem/Pem  05/25/2022: Cycle 37 Day 1 maintenance Pem/Pem  06/14/2022: Cycle 38 Day 1 maintenance Pem/Pem 07/05/2022: Cycle 39 Day 1 maintenance Pem/Pem 08/13/2022: Cycle 40 Day 1 maintenance Pem/Pem 09/06/2022:Cycle 41 Day 1 maintenance Pem/Pem 09/27/2022: Cycle 42 Day 1 maintenance Pem/Pem 10/19/2022: Cycle 43 Day 1 maintenance Pem/Pem 11/09/2022: Cycle 44 Day 1 maintenance Pem/Pem 11/30/2022: Cycle 45 Day 1 maintenance Pem/Pem 12/20/2022: Cycle 46 Day 1 maintenance Pem/Pem 01/10/2023: Cycle 47 Day 1 maintenance Pem/Pem 01/31/2023:Cycle 48 Day 1 maintenance Pem/Pem 02/22/2023: Cycle 49 Day 1 maintenance Pem/Pem 03/15/2023: Cycle 50 Day 1 maintenance Pem/Pem 04/04/2023: Cycle 51 Day 1 maintenance Pem/Pem 04/26/2023: Cycle 52 Day 1 maintenance Pem/Pem 05/17/2023: Cycle 53 Day 1 maintenance Pem/Pem 07/19/2023: Cycle 54 Day 1 maintenance Pembro (holding Pemetrexed ) 08/12/2023: Cycle 55 Day 1 maintenance Pem/Pem 09/02/2023: Cycle 56 Day 1 maintenance Pembro (holding Pemetrexed ) 09/23/2023: Cycle 57 Day 1 maintenance Pembro (holding Pemetrexed ) 10/15/2023: Cycle 58 Day 1 maintenance Pembro (holding Pemetrexed ) 11/27/2023:  Cycle 59 Day 1 maintenance Pembro (holding Pemetrexed ) 01/10/2024: Cycle 60 Day 1 maintenance Pembro (holding Pemetrexed ) 01/31/2024: Cycle 61 Day 1 maintenance Pembro (holding Pemetrexed )  Interval History:  TOY SAMARIN 68 y.o. female with medical history significant for metastatic adenocarcinoma of the lung presents for a follow up visit. The patient's last visit was on 03/12/2024.  On exam today Mrs. Dube reports she has been well overall since our last visit with the dizziness continuing to be her most prominent issue.  She reports her appetite is  good and her energy is strong, though she is taking naps throughout the day.  She does sleep well at night.  She reports that the swelling in her legs is markedly improved.  She does not have any trouble with nausea, vomiting, or diarrhea.  She has had no infectious symptoms such as runny nose, sore throat, cough.  She denies any fevers, chills, sweats.  She reports that she has been eating well and enjoying foods such as chicken wings and pizza.  She reports her husband cooks veggies and grill as well.  Otherwise she has had no changes in her health and is willing and able to proceed with Keytruda  therapy at this time.  MEDICAL HISTORY:  Past Medical History:  Diagnosis Date   Anemia    Anxiety    Concussion 09/28/2019   DVT (deep venous thrombosis) (HCC) 2021   L leg   Dyspnea    GERD (gastroesophageal reflux disease)    Hypercholesterolemia    per pt, she does not have elevated lipids   Hypertension    met lung ca dx'd 09/2019   mets to spine, hip and brain   PONV (postoperative nausea and vomiting)    Tobacco abuse     SURGICAL HISTORY: Past Surgical History:  Procedure Laterality Date   ABDOMINAL HYSTERECTOMY     partial/ left ovaries   APPLICATION OF CRANIAL NAVIGATION N/A 12/02/2020   Procedure: APPLICATION OF CRANIAL NAVIGATION;  Surgeon: [name], MD;  Location: [facility] OR;  Service: Neurosurgery;  Laterality: N/A;   CHOLECYSTECTOMY     CRANIOTOMY N/A 12/02/2020   Procedure: Posterior fossa craniotomy for tumor resection with brainlab;  Surgeon: [name], MD;  Location:  [facility] OR;  Service: Neurosurgery;  Laterality: N/A;   DILATION AND CURETTAGE OF UTERUS     IR IMAGING GUIDED PORT INSERTION  10/23/2019   IR PATIENT EVAL TECH 0-60 MINS  11/21/2021   IR PATIENT EVAL TECH 0-60 MINS  11/24/2021   IR PATIENT EVAL TECH 0-60 MINS  11/29/2021   IR PATIENT EVAL TECH 0-60 MINS  12/04/2021   IR PATIENT EVAL TECH 0-60 MINS  12/12/2021   IR PATIENT EVAL TECH 0-60 MINS  12/19/2021   IR  PATIENT EVAL TECH 0-60 MINS  12/27/2021   IR PATIENT EVAL TECH 0-60 MINS  01/03/2022   IR PATIENT EVAL TECH 0-60 MINS  01/10/2022   IR PATIENT EVAL TECH 0-60 MINS  01/18/2022   IR PATIENT EVAL TECH 0-60 MINS  02/13/2022   IR PATIENT EVAL TECH 0-60 MINS  02/20/2022   IR RADIOLOGIST EVAL & MGMT  01/23/2022   IR REMOVAL TUN ACCESS W/ PORT W/O FL MOD SED  11/17/2021   KYPHOPLASTY N/A 03/15/2020   Procedure: Thoracic Eight KYPHOPLASTY;  Surgeon: [name], MD;  Location: [facility] OR;  Service: Neurosurgery;  Laterality: N/A;  prone    LIPOMA EXCISION  2018   removed under left breast and right thigh.   PERICARDIOCENTESIS N/A 12/10/2023   Procedure: PERICARDIOCENTESIS;  Surgeon: Wendel Lurena POUR, MD;  Location: [facility] INVASIVE CV LAB;  Service: Cardiovascular;  Laterality: N/A;   TUBAL LIGATION      ALLERGIES:  is allergic to scopolamine .  MEDICATIONS:  Current Outpatient Medications  Medication Sig Dispense Refill   acetaminophen  (TYLENOL ) 325 MG tablet Take 1-2 tablets (325-650 mg total) by mouth every 4 (four) hours as needed for mild pain (pain score 1-3). (Patient taking differently: Take 500 mg by mouth every 4 (four) hours as needed for mild pain (pain score 1-3).)     albuterol  (VENTOLIN  HFA) 108 (90 Base) MCG/ACT inhaler INHALE 2 PUFFS INTO THE LUNGS EVERY 6 HOURS AS NEEDED FOR WHEEZING OR SHORTNESS OF BREATH 6.7 g 11   ALPRAZolam  (XANAX ) 0.25 MG tablet Take 1 tablet (0.25 mg total) by mouth 2 (two) times daily as needed for anxiety. 30 tablet 0   Cholecalciferol  (VITAMIN D3) 125 MCG (5000 UT) TABS Take 5,000 Units by mouth daily.     colchicine  0.6 MG tablet Take 1 tablet (0.6 mg total) by mouth daily. 90 tablet 0   Cyanocobalamin  (VITAMIN B12 PO) Take 5,000 mcg by mouth daily. Gummies     dexamethasone  (DECADRON ) 1 MG tablet Take 1 tablet (1 mg total) by mouth daily. (Patient taking differently: Take 0.5 mg by mouth daily.) 90 tablet 0   furosemide  (LASIX ) 20 MG tablet Take 1 tablet (20 mg total) by  mouth 2 (two) times a week. Tues & Thurs     hydrocortisone  (ANUSOL -HC) 2.5 % rectal cream use in rectal area 2 to 3 times a day as needed 30 g PRN   levETIRAcetam  (KEPPRA ) 1000 MG tablet Take 1 tablet (1,000 mg total) by mouth 2 (two) times daily. 180 tablet 2   magnesium  oxide (MAG-OX) 400 (240 Mg) MG tablet Take 1 tablet (400 mg total) by mouth 2 (two) times daily. 60 tablet 0   meclizine  (ANTIVERT ) 50 MG tablet Take 1 tablet (50 mg total) by mouth 3 (three) times daily. (Patient taking differently: Take 100 mg by mouth 3 (three) times daily. As needed) 90 tablet 2   metoprolol  succinate (TOPROL -XL) 25 MG 24 hr tablet Take 1 tablet (25 mg total) by  mouth daily. 90 tablet 3   Multiple Vitamin (MULTIVITAMIN WITH MINERALS) TABS tablet Take 2 tablets by mouth daily. Gummy     Nutritional Supplements (,FEEDING SUPPLEMENT, PROSOURCE PLUS) liquid Take 30 mLs by mouth 2 (two) times daily between meals. (Patient taking differently: Take 30 mLs by mouth 4 (four) times daily. Berry flavor)     OLANZapine  (ZYPREXA ) 2.5 MG tablet Take 1 tablet (2.5 mg total) by mouth at bedtime. 90 tablet 1   ondansetron  (ZOFRAN ) 4 MG tablet TAKE 1 TABLET(4 MG) BY MOUTH EVERY 8 HOURS AS NEEDED FOR NAUSEA OR VOMITING 40 tablet 2   oxyCODONE  (OXY IR/ROXICODONE ) 5 MG immediate release tablet Take 1 tablet (5 mg total) by mouth every 4 (four) hours as needed for severe pain (pain score 7-10). 90 tablet 0   pantoprazole  (PROTONIX ) 40 MG tablet TAKE 1 TABLET(40 MG) BY MOUTH DAILY 90 tablet 3   polyethylene glycol (MIRALAX  / GLYCOLAX ) 17 g packet Take 17 g by mouth daily as needed for moderate constipation or mild constipation.     potassium chloride  SA (KLOR-CON  M) 20 MEQ tablet TAKE 1 TABLET(20 MEQ) BY MOUTH TWICE DAILY 90 tablet 1   senna-docusate (SENOKOT-S) 8.6-50 MG tablet Take 2 tablets by mouth daily as needed (Patient preference, Sennakot S for mild constipation). (Patient taking differently: Take 2 tablets by mouth daily as  needed for mild constipation. 2 tabs 2 x daily)     spironolactone  (ALDACTONE ) 25 MG tablet Take 1 tablet (25 mg total) by mouth daily. 90 tablet 3   XTAMPZA  ER 13.5 MG C12A Take 1 capsule by mouth in the morning and at bedtime. 60 capsule 0   No current facility-administered medications for this visit.    REVIEW OF SYSTEMS:   Constitutional: ( - ) fevers, ( - )  chills , ( - ) night sweats Eyes: ( - ) blurriness of vision, ( - ) double vision, ( - ) watery eyes Ears, nose, mouth, throat, and face: ( - ) mucositis, ( - ) sore throat Respiratory: ( - ) cough, ( + ) dyspnea, ( - ) wheezes Cardiovascular: ( - ) palpitation, ( - ) chest discomfort, ( - ) lower extremity swelling Gastrointestinal:  ( + ) nausea, ( - ) heartburn, ( - ) change in bowel habits Skin: ( - ) abnormal skin rashes Lymphatics: ( - ) new lymphadenopathy, ( - ) easy bruising Neurological: ( - ) numbness, ( - ) tingling, ( - ) new weaknesses Behavioral/Psych: ( - ) mood change, ( - ) new changes  All other systems were reviewed with the patient and are negative.  PHYSICAL EXAMINATION: ECOG PERFORMANCE STATUS: 2 - Symptomatic, <50% confined to bed  Vitals:   04/03/24 1449  BP: 106/80  Pulse: 67  Resp: 16  Temp: 97.9 F (36.6 C)  SpO2: 94%     Filed Weights   04/03/24 1449  Weight: 185 lb 3.2 oz (84 kg)       GENERAL: well appearing middle aged Caucasian female in NAD  SKIN: skin color, texture, turgor are normal, no rashes or significant lesions EYES: conjunctiva are pink and non-injected, sclera clear LUNGS: wheezing at lung bases. clear to auscultation and percussion with normal breathing effort HEART: regular rate & rhythm and no murmurs and bilateral trace pitting lower extremity edema Musculoskeletal: no cyanosis of digits and no clubbing PSYCH: alert & oriented x 3, fluent speech NEURO: no focal motor/sensory deficits  LABORATORY DATA:  I have reviewed the  data as listed    Latest Ref Rng &  Units 04/03/2024    2:08 PM 03/12/2024   11:12 AM 02/21/2024    2:19 PM  CBC  WBC 4.0 - 10.5 K/uL 7.5  8.2  10.3   Hemoglobin 12.0 - 15.0 g/dL 86.7  87.2  86.8   Hematocrit 36.0 - 46.0 % 38.8  37.2  39.2   Platelets 150 - 400 K/uL 232  214  225        Latest Ref Rng & Units 04/03/2024    2:08 PM 03/12/2024   11:12 AM 02/21/2024    2:19 PM  CMP  Glucose 70 - 99 mg/dL 87  91  888   BUN 8 - 23 mg/dL 40  38  38   Creatinine 0.44 - 1.00 mg/dL 8.99  9.06  9.01   Sodium 135 - 145 mmol/L 141  143  142   Potassium 3.5 - 5.1 mmol/L 4.2  3.9  4.1   Chloride 98 - 111 mmol/L 107  106  106   CO2 22 - 32 mmol/L 29  32  30   Calcium  8.9 - 10.3 mg/dL 9.1  9.3  9.1   Total Protein 6.5 - 8.1 g/dL 6.4  6.3  6.2   Total Bilirubin 0.0 - 1.2 mg/dL 0.3  0.4  0.4   Alkaline Phos 38 - 126 U/L 47  41  39   AST 15 - 41 U/L 16  13  12    ALT 0 - 44 U/L 21  14  12       RADIOGRAPHIC STUDIES: I personally have viewed the radiographic studies below: MR BRAIN W WO CONTRAST Result Date: 03/19/2024 EXAM: MRI BRAIN WITH AND WITHOUT CONTRAST 03/19/2024 03:21:03 PM TECHNIQUE: Multiplanar multisequence MRI of the head/brain was performed with and without the administration of intravenous contrast. COMPARISON: 12/18/2023 and 10/08/2023. CLINICAL HISTORY: Brain/CNS neoplasm, assess treatment response. Adenocarcinoma of the lung. Hemorrhagic brain metastases. FINDINGS: BRAIN AND VENTRICLES: Numerous hemorrhagic enhancing brain metastases are again noted bilaterally. The hemorrhagic lesion in the left occipital lobe has returned to the dimensions of the 10/08/2023 exam, measuring 2.5 x 2.5 cm on sagittal image 25 of series 16. A right parietal lesion has increased in size, now measuring 2.0 x 3.0 x 2.5 cm. Surrounding vasogenic edema in the right parietal and posterior frontal lobe has increased as well. A lesion of the right occipital lobe measuring 12.5 x 7.5 mm on image 90 of series 13 is slightly larger than on the prior studies.  Multiple other hemorrhagic enhancing lesions bilaterally are stable. No new lesions are present. Mass effect on the right lateral ventricle has increased. ORBITS: No acute abnormality. SINUSES: No acute abnormality. BONES AND SOFT TISSUES: Normal bone marrow signal and enhancement. No acute soft tissue abnormality. IMPRESSION: 1. Numerous hemorrhagic enhancing brain metastases bilaterally, with interval increase in size of the right parietal lesion (now 2.0 x 3.0 x 2.5 cm) and right occipital lesion (now 12.5 x 7.5 mm), as well as increased surrounding vasogenic edema in the right parietal and posterior frontal lobe. 2. The left occipital lesion has decreased in size, now measuring 2.5 x 2.5 cm, similar to the 10/08/2023 exam. 3. No new lesions. 4. Increased mass effect on the right lateral ventricle. Electronically signed by: Lonni Necessary MD 03/19/2024 06:04 PM EDT RP Workstation: [id number]      ASSESSMENT & PLAN MIKAELAH TROSTLE is a 68 y.o. female with medical history significant for metastatic adenocarcinoma of the  lung presents for a follow up visit. 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   Previously we discussed Carboplatin /Pembrolizumab /Pemetrexed  and the expected side effect from these medications.  We discussed the possible side effects of immunotherapy including colitis, hepatitis, dermatitis, and pneumonitis.  Additionally we discussed the side effects of carboplatin  which include suppression of blood counts, nephrotoxicity, and nausea.  Additionally we discussed pemetrexed  which can also have effects on counts and would require supplementation with vitamin B12 and folic acid .  She voiced her understanding of these side effects and treatment plans moving forward.  I also noted that we would be able to drop the chemotherapy agents that she had intolerance to continue with pembrolizumab  alone given her high TPS score.  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 Due to worsening dizziness, she resumed this from Cycle 25-27.  PLAN: --Due for Cycle 64, Day 1 of maintenance single agent Pembrolizumab  today.  --Labs from today were reviewed and adequate for treatment.  Labs today show WBC 7.5, hemoglobin 13.2, MCV 94.4, platelets 232, creatinine overall stable and LFTs in range. --Last CT scan in 02/21/2024 showed no evidence of progressive disease. Next scan due around August 2025.  --RTC q 3 weeks for labs, follow up visit prior to Cycle 65.  #Brain Metastasis --appreciate the assistance of Dr. Buckley in treating her brain metastasis and symptoms. --radiation therapy to the brain completed on 11/16/2019.  --MRI brain on 04/29/2020 showed evidence of small brain bleed. Eliquis  therapy was discontinued.  --MRI on 11/04/2020 showed concern for progression of intracranial disease.  --On 12/02/2020, patient underwent craniotomy, resection of progressive cerebellar mass by Dr. Unice. 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  --MRI on 12/23/2020 showed evidence of radiation necrosis. Dr. Buckley evaluated the patient on 01/02/2021 with recommendations to add IV Bevacizumab  10 mg/kg q 3 weeks ( started 01/26/2021). Completed a full course of this therapy. --Patient was evaluated by Dr. Buckley on 08/29/2021 and recommended to resume Bevacizumab  due to worsening dizziness.  Which was given from Cycle 25-27.  Plan: --continue to follow with Dr. Buckley  --Most recent MRI from 12/18/2023 showed evidence of intracranial bleeding.  Eliquis  held indefinitely.  --Next MRI brain scheduled for Jan 2026   #Nausea/Vomiting, worsening --Regimen includes Zofran  4-8mg  q8h PRN and compazine  10mg  q6H for breakthrough PRN, Olanzpaine 2.5 mg PO QHS to help with  nausea.  She is currently using Phenergan  for breakthrough. --continue to monitor   #Hypokalemia --likely 2/2 to decreased PO intake, hypovolemia, and BP medications --Potassium level 3.9 today.   --Currently on potassium chloride  tablets, 40 mEq PO potassium in AM and 20mEq in PM.   #Pain Control, stable --continue oxycodone  5mg  q4H PRN. Can increase dose to 10mg  q6H if pain is severe. She is taking approximately 3-4 pills per day.  --continue Xtampza  13.5 mg ER BID for long acting support.  --continue to take with Senokot to prevent opioid induced constipation. 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    # Hypertension: --Continue Norvasc  to 10 mg daily.  --continue to monitor at home and in clinic.    Orders Placed This Encounter  Procedures   CT CHEST ABDOMEN PELVIS W CONTRAST    Standing Status:   Future    Expected Date:   04/27/2024    Expiration Date:   04/05/2025    If indicated for the ordered procedure, I authorize the administration of contrast media per Radiology protocol:   Yes    Does the patient have a contrast media/X-ray dye allergy?:   No    Preferred imaging location?:   [facility]    If indicated for the ordered procedure, I authorize the administration of oral contrast media per Radiology protocol:   Yes    All questions were answered. The patient knows to call the clinic with any problems, questions or concerns.  I have spent a total of 30 minutes minutes of face-to-face and non-face-to-face time, preparing to see the patient, performing a medically appropriate examination, counseling and educating the patient,  documenting clinical information in the electronic health record, and care coordination.   Norleen IVAR Kidney, MD Department of Hematology/Oncology [facility] at [facility] Phone: [phone] Pager: [phone] Email: [email or web address]   04/05/2024 8:27 PM   Literature Support:  Lucienne CROME, Rodrguez-Abreu D, Gadgeel S, Esteban E, Felip E, De Angelis F, Domine M, Clingan P, Hochmair MJ, [place], Cheng SY, Bischoff HG, Peled N, Grossi F, Jennens RR, Reck M, [place], [place], [place], Rubio-Viqueira B, Novello S, Kurata T, Gray JE, Vida J, Wei Z, Yang J, Raftopoulos H, [place], [place] [employer]; [id number] Investigators. Pembrolizumab  plus Chemotherapy in Metastatic Non-Small-Cell Lung Cancer. LOISE Diedra PARAS Med. 2018 May 31;378(22):2078-2092.  --Median progression-free survival was 8.8 months (95% CI, 7.6 to 9.2) in the pembrolizumab -combination group and 4.9 months (95% CI, 4.7 to 5.5) in the placebo-combination group (hazard ratio for disease progression or death, 0.52; 95% CI, 0.43 to 0.64; P<0.001). Adverse events of grade 3 or higher occurred in 67.2% of the patients in the pembrolizumab -combination group and in 65.8% of those in the placebo-combination group.  Missy KATHEE Diona CROME Cesario OLEGARIO Patt L. Efficacy of pemetrexed -based regimens in advanced non-small cell lung cancer patients with activating epidermal growth factor receptor mutations after tyrosine kinase inhibitor failure: a systematic review. Onco Targets Ther. 2018;11:2121-2129.   --The weighted median PFS, median OS, and ORR for patients treated with pem regimens were 5.09 months, 15.91 months, and 30.19%, respectively. Our systematic review results showed a favorable efficacy profile of pem regimens in NSCLC patients with EGFR mutation after EGFR-TKI failure.

## 2024-04-05 ENCOUNTER — Encounter: Payer: Self-pay | Admitting: Hematology and Oncology

## 2024-04-14 ENCOUNTER — Other Ambulatory Visit: Payer: Self-pay

## 2024-04-14 DIAGNOSIS — G893 Neoplasm related pain (acute) (chronic): Secondary | ICD-10-CM

## 2024-04-14 DIAGNOSIS — Z515 Encounter for palliative care: Secondary | ICD-10-CM

## 2024-04-14 DIAGNOSIS — C7951 Secondary malignant neoplasm of bone: Secondary | ICD-10-CM

## 2024-04-14 MED ORDER — OXYCODONE HCL 5 MG PO TABS
5.0000 mg | ORAL_TABLET | ORAL | 0 refills | Status: DC | PRN
Start: 2024-04-14 — End: 2024-05-01

## 2024-04-16 ENCOUNTER — Inpatient Hospital Stay: Attending: Hematology and Oncology | Admitting: Nurse Practitioner

## 2024-04-16 ENCOUNTER — Encounter: Payer: Self-pay | Admitting: Nurse Practitioner

## 2024-04-16 DIAGNOSIS — C3490 Malignant neoplasm of unspecified part of unspecified bronchus or lung: Secondary | ICD-10-CM

## 2024-04-16 DIAGNOSIS — Z515 Encounter for palliative care: Secondary | ICD-10-CM | POA: Diagnosis not present

## 2024-04-16 DIAGNOSIS — G893 Neoplasm related pain (acute) (chronic): Secondary | ICD-10-CM | POA: Diagnosis not present

## 2024-04-16 DIAGNOSIS — K5903 Drug induced constipation: Secondary | ICD-10-CM

## 2024-04-16 DIAGNOSIS — C7951 Secondary malignant neoplasm of bone: Secondary | ICD-10-CM

## 2024-04-16 MED ORDER — XTAMPZA ER 13.5 MG PO C12A: 1.0000 | EXTENDED_RELEASE_CAPSULE | Freq: Two times a day (BID) | ORAL | 0 refills | Status: DC

## 2024-04-16 NOTE — Progress Notes (Signed)
 Palliative Medicine [facility]  Telephone:([phone]) [phone] Fax:([phone]) [phone]   Name: Virginia [name] Date: 04/16/2024 MRN: [id number]  DOB: [birth year]  Patient Care Team: Perri Ronal PARAS, MD as PCP - General (Internal Medicine) Federico Norleen ONEIDA MADISON, MD as PCP - Hematology/Oncology (Hematology and Oncology) Evertt Lonell BROCKS, RN as Oncology Nurse Navigator Marvine Almarie CROME, DO as Consulting Physician [facility] and Palliative Medicine) Lbcardiology, Rande, MD as Rounding Team (Cardiology) Pickenpack-Cousar, Fannie SAILOR, NP as Nurse Practitioner [facility] and Palliative Medicine)   I connected with Virginia [name] on 04/16/24 at  4:00 PM EDT by telephone and verified that I am speaking with the correct person using two identifiers.   I discussed the limitations, risks, security and privacy concerns of performing an evaluation and management service by telemedicine and the availability of in-person appointments. I also discussed with the patient that there may be a patient responsible charge related to this service. The patient expressed understanding and agreed to proceed.   Other persons participating in the visit and their role in the encounter: Husband    Patient's location: [facility]  Provider's location: Home    INTERVAL HISTORY: Virginia [name] is a 68 y.o. female with oncologic medical history including non-small cell lung cancer (09/2019) with metastatic disease to the bone and brain. Palliative ask to see for symptom management and goals of care.   SOCIAL HISTORY:     reports that she quit smoking about 11 years ago. Her smoking use included cigarettes. She has never used smokeless tobacco. She reports current alcohol use. She reports that she does not use drugs.  ADVANCE DIRECTIVES:  Advanced directives on file naming Agata Lucente and Rosina Horns as the primary and secondary decision makers should the patient become unable to speak for themselves.   CODE  STATUS: Full code  PAST MEDICAL HISTORY: Past Medical History:  Diagnosis Date   Anemia    Anxiety    Concussion 09/28/2019   DVT (deep venous thrombosis) (HCC) 2021   L leg   Dyspnea    GERD (gastroesophageal reflux disease)    Hypercholesterolemia    per pt, she does not have elevated lipids   Hypertension    met lung ca dx'd 09/2019   mets to spine, hip and brain   PONV (postoperative nausea and vomiting)    Tobacco abuse     ALLERGIES:  is allergic to scopolamine .  MEDICATIONS:  Current Outpatient Medications  Medication Sig Dispense Refill   acetaminophen  (TYLENOL ) 325 MG tablet Take 1-2 tablets (325-650 mg total) by mouth every 4 (four) hours as needed for mild pain (pain score 1-3). (Patient taking differently: Take 500 mg by mouth every 4 (four) hours as needed for mild pain (pain score 1-3).)     albuterol  (VENTOLIN  HFA) 108 (90 Base) MCG/ACT inhaler INHALE 2 PUFFS INTO THE LUNGS EVERY 6 HOURS AS NEEDED FOR WHEEZING OR SHORTNESS OF BREATH 6.7 g 11   ALPRAZolam  (XANAX ) 0.25 MG tablet Take 1 tablet (0.25 mg total) by mouth 2 (two) times daily as needed for anxiety. 30 tablet 0   Cholecalciferol  (VITAMIN D3) 125 MCG (5000 UT) TABS Take 5,000 Units by mouth daily.     colchicine  0.6 MG tablet Take 1 tablet (0.6 mg total) by mouth daily. 90 tablet 0   Cyanocobalamin  (VITAMIN B12 PO) Take 5,000 mcg by mouth daily. Gummies     dexamethasone  (DECADRON ) 1 MG tablet Take 1 tablet (1 mg total) by mouth daily. (  Patient taking differently: Take 0.5 mg by mouth daily.) 90 tablet 0   furosemide  (LASIX ) 20 MG tablet Take 1 tablet (20 mg total) by mouth 2 (two) times a week. Tues & Thurs     hydrocortisone  (ANUSOL -HC) 2.5 % rectal cream use in rectal area 2 to 3 times a day as needed 30 g PRN   levETIRAcetam  (KEPPRA ) 1000 MG tablet Take 1 tablet (1,000 mg total) by mouth 2 (two) times daily. 180 tablet 2   magnesium  oxide (MAG-OX) 400 (240 Mg) MG tablet Take 1 tablet (400 mg total) by  mouth 2 (two) times daily. 60 tablet 0   meclizine  (ANTIVERT ) 50 MG tablet Take 1 tablet (50 mg total) by mouth 3 (three) times daily. (Patient taking differently: Take 100 mg by mouth 3 (three) times daily. As needed) 90 tablet 2   metoprolol  succinate (TOPROL -XL) 25 MG 24 hr tablet Take 1 tablet (25 mg total) by mouth daily. 90 tablet 3   Multiple Vitamin (MULTIVITAMIN WITH MINERALS) TABS tablet Take 2 tablets by mouth daily. Gummy     Nutritional Supplements (,FEEDING SUPPLEMENT, PROSOURCE PLUS) liquid Take 30 mLs by mouth 2 (two) times daily between meals. (Patient taking differently: Take 30 mLs by mouth 4 (four) times daily. Berry flavor)     OLANZapine  (ZYPREXA ) 2.5 MG tablet Take 1 tablet (2.5 mg total) by mouth at bedtime. 90 tablet 1   ondansetron  (ZOFRAN ) 4 MG tablet TAKE 1 TABLET(4 MG) BY MOUTH EVERY 8 HOURS AS NEEDED FOR NAUSEA OR VOMITING 40 tablet 2   oxyCODONE  (OXY IR/ROXICODONE ) 5 MG immediate release tablet Take 1 tablet (5 mg total) by mouth every 4 (four) hours as needed for severe pain (pain score 7-10). 90 tablet 0   pantoprazole  (PROTONIX ) 40 MG tablet TAKE 1 TABLET(40 MG) BY MOUTH DAILY 90 tablet 3   polyethylene glycol (MIRALAX  / GLYCOLAX ) 17 g packet Take 17 g by mouth daily as needed for moderate constipation or mild constipation.     potassium chloride  SA (KLOR-CON  M) 20 MEQ tablet TAKE 1 TABLET(20 MEQ) BY MOUTH TWICE DAILY 90 tablet 1   senna-docusate (SENOKOT-S) 8.6-50 MG tablet Take 2 tablets by mouth daily as needed (Patient preference, Sennakot S for mild constipation). (Patient taking differently: Take 2 tablets by mouth daily as needed for mild constipation. 2 tabs 2 x daily)     spironolactone  (ALDACTONE ) 25 MG tablet Take 1 tablet (25 mg total) by mouth daily. 90 tablet 3   XTAMPZA  ER 13.5 MG C12A Take 1 capsule by mouth in the morning and at bedtime. 60 capsule 0   No current facility-administered medications for this visit.    VITAL SIGNS: There were no  vitals taken for this visit. There were no vitals filed for this visit.   Estimated body mass index is 33.87 kg/m as calculated from the following:   Height as of 02/17/24: 5' 2 (1.575 m).   Weight as of 04/03/24: 185 lb 3.2 oz (84 kg).   PERFORMANCE STATUS (ECOG) : 1 - Symptomatic but completely ambulatory  IMPRESSION: Discussed the use of AI scribe software for clinical note transcription with the patient, who gave verbal consent to proceed.  History of Present Illness IYARI HAGNER is a 68 year old female who I connected with by phone for follow-up. Her husband engaged in telephone discussions. No acute distress. She continues to do well. Her appetite is good. No concerns for nausea, vomiting, constipation, or diarrhea. Remains as active as possible.  Mrs. Chiu pain remains well controlled on current regimen. She is taking Xtampza  every 12 hours and Oxycodone  every 4-6 hours as needed for breakthrough pain is effective. Does not require around the clock use of breakthrough medication. Some days she only requiring once. No adjustments made to regimen at this time.   All questions answered and support provided.   I discussed the importance of continued conversation with family and their medical providers regarding overall plan of care and treatment options, ensuring decisions are within the context of the patients values and GOCs.  Assessment & Plan Cancer under immunotherapy Cancer is well-controlled per patient.  - Continue Keytruda  therapy per Oncology.  - Avoid NSAIDs to prevent decreased efficacy.  Pain management Chronic pain managed with Xtampza  extended release and breakthrough medication. Pain levels are 6-7 out of 10 during visit. She reports requiring breakthrough medication use as needed depending on level of activities for the day. Current regimen is manageable, though pain levels have recently increased. - Continue Xtampza  13.5mg  extended release every 12  hours. -Continue Oxycodone  every 4-6 hours as needed for breakthrough pain. - Monitor pain levels for consistency and severity. - Discuss potential adjustments to pain regimen if pain becomes more consistent and severe.  Constipation Constipation managed with Senokot twice daily and Miralax  as needed. Regular use is necessary to prevent difficulty with bowel movements. - Continue Senokot twice daily. - Use Miralax  as needed for constipation.  I will plan to follow-up with patient in 4-6 weeks. Sooner if needed.     Patient expressed understanding and was in agreement with this plan. She also understands that She can call the clinic at any time with any questions, concerns, or complaints.   Any controlled substances utilized were prescribed in the context of palliative care. PDMP has been reviewed.   I provided 25 minutes of non face-to-face telephone visit time during this encounter, and > 50% was spent counseling as documented under my assessment & plan. Visit consisted of counseling and education dealing with the complex and emotionally intense issues of symptom management and palliative care in the setting of serious and potentially life-threatening illness.  Levon Borer, AGPCNP-BC  Palliative Medicine Team/[facility]

## 2024-04-24 ENCOUNTER — Inpatient Hospital Stay

## 2024-04-24 ENCOUNTER — Encounter: Payer: Self-pay | Admitting: Hematology and Oncology

## 2024-04-24 ENCOUNTER — Telehealth: Payer: Self-pay

## 2024-04-24 ENCOUNTER — Inpatient Hospital Stay: Admitting: Physician Assistant

## 2024-04-24 ENCOUNTER — Telehealth (INDEPENDENT_AMBULATORY_CARE_PROVIDER_SITE_OTHER): Admitting: Internal Medicine

## 2024-04-24 ENCOUNTER — Encounter: Payer: Self-pay | Admitting: Internal Medicine

## 2024-04-24 ENCOUNTER — Encounter: Payer: Self-pay | Admitting: Physician Assistant

## 2024-04-24 DIAGNOSIS — C7931 Secondary malignant neoplasm of brain: Secondary | ICD-10-CM | POA: Diagnosis not present

## 2024-04-24 DIAGNOSIS — J069 Acute upper respiratory infection, unspecified: Secondary | ICD-10-CM | POA: Diagnosis not present

## 2024-04-24 DIAGNOSIS — C7801 Secondary malignant neoplasm of right lung: Secondary | ICD-10-CM

## 2024-04-24 DIAGNOSIS — C7800 Secondary malignant neoplasm of unspecified lung: Secondary | ICD-10-CM

## 2024-04-24 MED ORDER — AZITHROMYCIN 250 MG PO TABS
ORAL_TABLET | ORAL | 0 refills | Status: DC
Start: 2024-04-24 — End: 2024-05-01

## 2024-04-24 NOTE — Telephone Encounter (Signed)
 I spoke with the patient and her husband this morning. She reports post nasal drip, chest congestion with mucus, feeling weak, sore throat.She denies having a fever and last checked it was 97.7 temporal but she did take tylenol  500 mg at 0230 this morning. She is asking for abx. Advised pt and her husband we would need her to go to [facility]x to get OTC COVID test and report back to us  the results. Her appts for today were cancelled, as she states she feels too bad to come in. Message routed to [place] [employer], [place] for her to advise once COVID test is processed.

## 2024-04-24 NOTE — Progress Notes (Signed)
 Patient Care Team: Perri Ronal PARAS, MD as PCP - General (Internal Medicine) Federico Norleen ONEIDA MADISON, MD as PCP - Hematology/Oncology (Hematology and Oncology) Evertt Lonell BROCKS, RN as Oncology Nurse Navigator Marvine Almarie CROME, DO as Consulting Physician [facility] and Palliative Medicine) Lbcardiology, Rande, MD as Rounding Team (Cardiology) Pickenpack-Cousar, Fannie SAILOR, NP as Nurse Practitioner [facility] and Palliative Medicine)  I connected with Virginia [name] on 04/24/24 at 3:00 PM by video enabled telemedicine visit and verified that I am speaking with the correct person using two identifiers, myself and Virginia [name], CMA. I am in my office and patient is in their home.    I discussed the limitations, risks, security and privacy concerns of performing an evaluation and management service by telemedicine and the availability of in-person appointments. I also discussed with the patient that there may be a patient responsible charge related to this service. The patient expressed understanding and agreed to proceed.   Other persons participating in the visit and their role in the encounter: Medical scribe, Damien Blanks  Patient's location: Home  Provider's location: Clinic   Chief Complaint  Patient presents with   Cough    Deep cough, coughing up clear started Tuesday.    Subjective:  Patient PI:Virginia [name],Female DOB:[birth year],67 y.o. MRN:[id number]   67 y.o. Female presents today for acute sick visit with Cough. Patient has a past medical history of Metastatic Lung Cancer; Former Smoker; COPD. Says that her symptoms -sore throat, expectoration of white sputum, and headache- began yesterday, 8/14. She denies worsening SOB or fever/chills. At-home combined Covid&Flu A/B negative. She is staying hydrated as much as possible.   Past Medical History:  Diagnosis Date   Anemia    Anxiety    Concussion 09/28/2019   DVT (deep venous thrombosis) (HCC) 2021   L leg   Dyspnea     GERD (gastroesophageal reflux disease)    Hypercholesterolemia    per pt, she does not have elevated lipids   Hypertension    met lung ca dx'd 09/2019   mets to spine, hip and brain   PONV (postoperative nausea and vomiting)    Tobacco abuse     Allergies  Allergen Reactions   Scopolamine  Rash   Past Surgical History:  Procedure Laterality Date   ABDOMINAL HYSTERECTOMY     partial/ left ovaries   APPLICATION OF CRANIAL NAVIGATION N/A 12/02/2020   Procedure: APPLICATION OF CRANIAL NAVIGATION;  Surgeon: [name], MD;  Location: [facility] OR;  Service: Neurosurgery;  Laterality: N/A;   CHOLECYSTECTOMY     CRANIOTOMY N/A 12/02/2020   Procedure: Posterior fossa craniotomy for tumor resection with brainlab;  Surgeon: [name], MD;  Location: [facility] OR;  Service: Neurosurgery;  Laterality: N/A;   DILATION AND CURETTAGE OF UTERUS     IR IMAGING GUIDED PORT INSERTION  10/23/2019   IR PATIENT EVAL TECH 0-60 MINS  11/21/2021   IR PATIENT EVAL TECH 0-60 MINS  11/24/2021   IR PATIENT EVAL TECH 0-60 MINS  11/29/2021   IR PATIENT EVAL TECH 0-60 MINS  12/04/2021   IR PATIENT EVAL TECH 0-60 MINS  12/12/2021   IR PATIENT EVAL TECH 0-60 MINS  12/19/2021   IR PATIENT EVAL TECH 0-60 MINS  12/27/2021   IR PATIENT EVAL TECH 0-60 MINS  01/03/2022   IR PATIENT EVAL TECH 0-60 MINS  01/10/2022   IR PATIENT EVAL TECH 0-60 MINS  01/18/2022   IR PATIENT EVAL TECH 0-60 MINS  02/13/2022   IR PATIENT  EVAL TECH 0-60 MINS  02/20/2022   IR RADIOLOGIST EVAL & MGMT  01/23/2022   IR REMOVAL TUN ACCESS W/ PORT W/O FL MOD SED  11/17/2021   KYPHOPLASTY N/A 03/15/2020   Procedure: Thoracic Eight KYPHOPLASTY;  Surgeon: [name], MD;  Location: [facility] OR;  Service: Neurosurgery;  Laterality: N/A;  prone    LIPOMA EXCISION  2018   removed under left breast and right thigh.   PERICARDIOCENTESIS N/A 12/10/2023   Procedure: PERICARDIOCENTESIS;  Surgeon: Wendel Lurena POUR, MD;  Location: [facility] INVASIVE CV LAB;  Service: Cardiovascular;  Laterality:  N/A;   TUBAL LIGATION     Family History  Problem Relation Age of Onset   Heart disease Father    Drug abuse Daughter    Drug abuse Son    Cancer Sister    Heart disease Brother    Heart attack Brother    Social History   Social History Narrative   Not on file   Review of Systems  Constitutional:  Negative for chills, fever and malaise/fatigue.  HENT:  Positive for sore throat. Negative for congestion and ear pain.   Eyes:  Negative for blurred vision.  Respiratory:  Positive for cough and sputum production (white). Negative for shortness of breath (not worsened, at baseline).   Cardiovascular:  Negative for chest pain, palpitations and leg swelling.  Gastrointestinal:  Negative for vomiting.  Musculoskeletal:  Negative for back pain.  Skin:  Negative for rash.  Neurological:  Positive for headaches. Negative for loss of consciousness.     Objective:  Vitals provided by patient: There were no vitals taken for this visit. Physical exam limited due to nature of visit Physical Exam Vitals and nursing note reviewed.  Constitutional:      General: She is not in acute distress.    Appearance: Normal appearance. She is not toxic-appearing.  HENT:     Head: Normocephalic and atraumatic.  Pulmonary:     Effort: Pulmonary effort is normal.  Skin:    General: Skin is warm and dry.  Neurological:     Mental Status: She is alert and oriented to person, place, and time. Mental status is at baseline.  Psychiatric:        Mood and Affect: Mood normal.        Behavior: Behavior normal.        Thought Content: Thought content normal.        Judgment: Judgment normal.     Results:  Studies Obtained And Personally Reviewed By Me: Labs:  CBC w/ Differential Lab Results  Component Value Date   WBC 7.5 04/03/2024   RBC 4.11 04/03/2024   HGB 13.2 04/03/2024   HCT 38.8 04/03/2024   PLT 232 04/03/2024   MCV 94.4 04/03/2024   MCH 32.1 04/03/2024   MCHC 34.0 04/03/2024   RDW 12.7  04/03/2024   MPV 9.3 11/15/2017   LYMPHSABS 1.9 04/03/2024   MONOABS 0.9 04/03/2024   BASOSABS 0.1 04/03/2024    Comprehensive Metabolic Panel Lab Results  Component Value Date   NA 141 04/03/2024   K 4.2 04/03/2024   CL 107 04/03/2024   CO2 29 04/03/2024   GLUCOSE 87 04/03/2024   BUN 40 (H) 04/03/2024   CREATININE 1.00 04/03/2024   CALCIUM  9.1 04/03/2024   PROT 6.4 (L) 04/03/2024   ALBUMIN 3.4 (L) 04/03/2024   AST 16 04/03/2024   ALT 21 04/03/2024   ALKPHOS 47 04/03/2024   BILITOT 0.3 04/03/2024   GFRNONAA >60 04/03/2024  Lipid Panel  Lab Results  Component Value Date   CHOL 235 (H) 01/12/2019   HDL 92 01/12/2019   LDLCALC 117 (H) 01/12/2019   TRIG 134 06/05/2023   A1c Lab Results  Component Value Date   HGBA1C 7.3 (H) 06/03/2023    TSH Lab Results  Component Value Date   TSH 1.820 04/03/2024   Assessment & Plan:  I provided 10 minutes of time spent during this encounter, including chart review, interviewing patient, medical decision making, and e-scribing medication with > 50% was spent counseling as documented under my assessment & plan.  Meds ordered this encounter  Medications   azithromycin  (ZITHROMAX ) 250 MG tablet    Sig: Take 2 tablets on day 1, then 1 tablet daily on days 2 through 5    Dispense:  6 tablet    Refill:  0   Acute Upper Respiratory Infection: symptoms -sore throat, expectoration of white sputum, and headache- began yesterday, 8/14. She denies worsening SOB or fever/chills. At-home combined Covid&Flu A/B negative. She is staying hydrated as much as possible.   Sending in Azithromycin  250 mg - take 2 tablets on Day 1 and 1 tablet on Days 2-5. Stay well rested, well hydrated, and well nourished. Contact us  if symptoms worsen/persist despite treatment.   Hx of metastatic lung cancer  Plan: Sent in Zithromax  Z pak which has worked well for her in the past with respiratory infections.  Time spent with video visit , medical decision making,  e-scribing medication, and chart review is 20 minutes.   I,Emily Lagle,acting as a Neurosurgeon for Ronal JINNY Hailstone, MD.,have documented all relevant documentation on the behalf of Ronal JINNY Hailstone, MD,as directed by  Ronal JINNY Hailstone, MD while in the presence of Ronal JINNY Hailstone, MD.  I, Ronal JINNY Hailstone, MD, have reviewed all documentation for this visit. The documentation on 04/24/2024 for the exam, diagnosis, procedures, and orders are all accurate and complete.

## 2024-04-24 NOTE — Telephone Encounter (Signed)
 I called pt's husband to check and see if she was able to take a COVID test. They purchased a rapid flu A/B and COVID test at their local pharmacy. Per Darina, pt was negative for all. She had a virtual visit with her PCP and was prescribed abx, but her husband is unsure which abx it is since it has not been picked up yet.  She was encouraged to keep her CT CAP appt as scheduled d/t schedulikng restraints, as long as she is feeling well enough to go, since by that point she will have completed 48 hr with abx. He verbalized agreement. Pt is planned for appts 05/01/24 labs, PA visit and infusion. Today's planned tx was cancelled d/t sx. Messages forwarded to MD and PA for awareness.

## 2024-04-26 ENCOUNTER — Encounter: Payer: Self-pay | Admitting: Internal Medicine

## 2024-04-26 ENCOUNTER — Encounter (HOSPITAL_COMMUNITY): Payer: Self-pay

## 2024-04-26 ENCOUNTER — Emergency Department (HOSPITAL_COMMUNITY)

## 2024-04-26 ENCOUNTER — Inpatient Hospital Stay (HOSPITAL_COMMUNITY)
Admission: EM | Admit: 2024-04-26 | Discharge: 2024-05-01 | DRG: 193 | Disposition: A | Discharge status: Skilled Nursing Facility | Attending: Internal Medicine | Admitting: Internal Medicine

## 2024-04-26 ENCOUNTER — Other Ambulatory Visit: Payer: Self-pay

## 2024-04-26 DIAGNOSIS — Z87891 Personal history of nicotine dependence: Secondary | ICD-10-CM

## 2024-04-26 DIAGNOSIS — I5020 Unspecified systolic (congestive) heart failure: Secondary | ICD-10-CM | POA: Diagnosis present

## 2024-04-26 DIAGNOSIS — Z515 Encounter for palliative care: Secondary | ICD-10-CM

## 2024-04-26 DIAGNOSIS — I428 Other cardiomyopathies: Secondary | ICD-10-CM | POA: Diagnosis present

## 2024-04-26 DIAGNOSIS — Z85118 Personal history of other malignant neoplasm of bronchus and lung: Secondary | ICD-10-CM

## 2024-04-26 DIAGNOSIS — E785 Hyperlipidemia, unspecified: Secondary | ICD-10-CM | POA: Diagnosis present

## 2024-04-26 DIAGNOSIS — Z79891 Long term (current) use of opiate analgesic: Secondary | ICD-10-CM

## 2024-04-26 DIAGNOSIS — G8929 Other chronic pain: Secondary | ICD-10-CM | POA: Diagnosis present

## 2024-04-26 DIAGNOSIS — G894 Chronic pain syndrome: Secondary | ICD-10-CM | POA: Diagnosis present

## 2024-04-26 DIAGNOSIS — F419 Anxiety disorder, unspecified: Secondary | ICD-10-CM | POA: Diagnosis present

## 2024-04-26 DIAGNOSIS — R5381 Other malaise: Secondary | ICD-10-CM

## 2024-04-26 DIAGNOSIS — B9789 Other viral agents as the cause of diseases classified elsewhere: Secondary | ICD-10-CM | POA: Diagnosis present

## 2024-04-26 DIAGNOSIS — R9431 Abnormal electrocardiogram [ECG] [EKG]: Secondary | ICD-10-CM | POA: Diagnosis present

## 2024-04-26 DIAGNOSIS — J449 Chronic obstructive pulmonary disease, unspecified: Secondary | ICD-10-CM | POA: Diagnosis present

## 2024-04-26 DIAGNOSIS — E118 Type 2 diabetes mellitus with unspecified complications: Secondary | ICD-10-CM | POA: Diagnosis present

## 2024-04-26 DIAGNOSIS — G40909 Epilepsy, unspecified, not intractable, without status epilepticus: Secondary | ICD-10-CM | POA: Diagnosis present

## 2024-04-26 DIAGNOSIS — K219 Gastro-esophageal reflux disease without esophagitis: Secondary | ICD-10-CM | POA: Diagnosis present

## 2024-04-26 DIAGNOSIS — J9601 Acute respiratory failure with hypoxia: Secondary | ICD-10-CM | POA: Diagnosis present

## 2024-04-26 DIAGNOSIS — Z7952 Long term (current) use of systemic steroids: Secondary | ICD-10-CM

## 2024-04-26 DIAGNOSIS — J189 Pneumonia, unspecified organism: Principal | ICD-10-CM | POA: Diagnosis present

## 2024-04-26 DIAGNOSIS — R531 Weakness: Secondary | ICD-10-CM | POA: Diagnosis not present

## 2024-04-26 DIAGNOSIS — Z923 Personal history of irradiation: Secondary | ICD-10-CM

## 2024-04-26 DIAGNOSIS — E7800 Pure hypercholesterolemia, unspecified: Secondary | ICD-10-CM | POA: Diagnosis present

## 2024-04-26 DIAGNOSIS — Z809 Family history of malignant neoplasm, unspecified: Secondary | ICD-10-CM

## 2024-04-26 DIAGNOSIS — Z813 Family history of other psychoactive substance abuse and dependence: Secondary | ICD-10-CM

## 2024-04-26 DIAGNOSIS — Z86718 Personal history of other venous thrombosis and embolism: Secondary | ICD-10-CM

## 2024-04-26 DIAGNOSIS — I351 Nonrheumatic aortic (valve) insufficiency: Secondary | ICD-10-CM | POA: Diagnosis present

## 2024-04-26 DIAGNOSIS — C7951 Secondary malignant neoplasm of bone: Secondary | ICD-10-CM

## 2024-04-26 DIAGNOSIS — N3000 Acute cystitis without hematuria: Secondary | ICD-10-CM

## 2024-04-26 DIAGNOSIS — Z9049 Acquired absence of other specified parts of digestive tract: Secondary | ICD-10-CM

## 2024-04-26 DIAGNOSIS — Z1152 Encounter for screening for COVID-19: Secondary | ICD-10-CM

## 2024-04-26 DIAGNOSIS — Z8249 Family history of ischemic heart disease and other diseases of the circulatory system: Secondary | ICD-10-CM

## 2024-04-26 DIAGNOSIS — G893 Neoplasm related pain (acute) (chronic): Secondary | ICD-10-CM

## 2024-04-26 DIAGNOSIS — I110 Hypertensive heart disease with heart failure: Secondary | ICD-10-CM | POA: Diagnosis present

## 2024-04-26 DIAGNOSIS — Z79899 Other long term (current) drug therapy: Secondary | ICD-10-CM

## 2024-04-26 DIAGNOSIS — I5022 Chronic systolic (congestive) heart failure: Secondary | ICD-10-CM | POA: Diagnosis present

## 2024-04-26 DIAGNOSIS — I10 Essential (primary) hypertension: Secondary | ICD-10-CM | POA: Diagnosis present

## 2024-04-26 DIAGNOSIS — A419 Sepsis, unspecified organism: Secondary | ICD-10-CM

## 2024-04-26 DIAGNOSIS — Z888 Allergy status to other drugs, medicaments and biological substances status: Secondary | ICD-10-CM

## 2024-04-26 DIAGNOSIS — E876 Hypokalemia: Secondary | ICD-10-CM | POA: Diagnosis present

## 2024-04-26 DIAGNOSIS — B348 Other viral infections of unspecified site: Secondary | ICD-10-CM

## 2024-04-26 DIAGNOSIS — I3139 Other pericardial effusion (noninflammatory): Secondary | ICD-10-CM | POA: Diagnosis present

## 2024-04-26 DIAGNOSIS — Z8673 Personal history of transient ischemic attack (TIA), and cerebral infarction without residual deficits: Secondary | ICD-10-CM

## 2024-04-26 DIAGNOSIS — C7931 Secondary malignant neoplasm of brain: Secondary | ICD-10-CM | POA: Diagnosis present

## 2024-04-26 DIAGNOSIS — J440 Chronic obstructive pulmonary disease with acute lower respiratory infection: Secondary | ICD-10-CM | POA: Diagnosis present

## 2024-04-26 DIAGNOSIS — N390 Urinary tract infection, site not specified: Secondary | ICD-10-CM

## 2024-04-26 DIAGNOSIS — Z86711 Personal history of pulmonary embolism: Secondary | ICD-10-CM

## 2024-04-26 LAB — RESP PANEL BY RT-PCR (RSV, FLU A&B, COVID)  RVPGX2
Influenza A by PCR: NEGATIVE
Influenza B by PCR: NEGATIVE
Resp Syncytial Virus by PCR: NEGATIVE
SARS Coronavirus 2 by RT PCR: NEGATIVE

## 2024-04-26 LAB — CBC WITH DIFFERENTIAL/PLATELET
Abs Immature Granulocytes: 0.06 K/uL (ref 0.00–0.07)
Basophils Absolute: 0 K/uL (ref 0.0–0.1)
Basophils Relative: 0 %
Eosinophils Absolute: 0.1 K/uL (ref 0.0–0.5)
Eosinophils Relative: 1 %
HCT: 38.4 % (ref 36.0–46.0)
Hemoglobin: 12.8 g/dL (ref 12.0–15.0)
Immature Granulocytes: 1 %
Lymphocytes Relative: 24 %
Lymphs Abs: 2.2 K/uL (ref 0.7–4.0)
MCH: 32.2 pg (ref 26.0–34.0)
MCHC: 33.3 g/dL (ref 30.0–36.0)
MCV: 96.5 fL (ref 80.0–100.0)
Monocytes Absolute: 1.6 K/uL — ABNORMAL HIGH (ref 0.1–1.0)
Monocytes Relative: 17 %
Neutro Abs: 5.3 K/uL (ref 1.7–7.7)
Neutrophils Relative %: 57 %
Platelets: 214 K/uL (ref 150–400)
RBC: 3.98 MIL/uL (ref 3.87–5.11)
RDW: 12.4 % (ref 11.5–15.5)
WBC: 9.1 K/uL (ref 4.0–10.5)
nRBC: 0 % (ref 0.0–0.2)

## 2024-04-26 LAB — I-STAT CG4 LACTIC ACID, ED: Lactic Acid, Venous: 1.1 mmol/L (ref 0.5–1.9)

## 2024-04-26 LAB — COMPREHENSIVE METABOLIC PANEL WITH GFR
ALT: 13 U/L (ref 0–44)
AST: 14 U/L — ABNORMAL LOW (ref 15–41)
Albumin: 2.2 g/dL — ABNORMAL LOW (ref 3.5–5.0)
Alkaline Phosphatase: 39 U/L (ref 38–126)
Anion gap: 14 (ref 5–15)
BUN: 19 mg/dL (ref 8–23)
CO2: 20 mmol/L — ABNORMAL LOW (ref 22–32)
Calcium: 7.3 mg/dL — ABNORMAL LOW (ref 8.9–10.3)
Chloride: 108 mmol/L (ref 98–111)
Creatinine, Ser: 0.86 mg/dL (ref 0.44–1.00)
GFR, Estimated: >60 mL/min (ref >60)
Glucose, Bld: 97 mg/dL (ref 70–99)
Potassium: 3 mmol/L — ABNORMAL LOW (ref 3.5–5.1)
Sodium: 142 mmol/L (ref 135–145)
Total Bilirubin: 0.9 mg/dL (ref 0.0–1.2)
Total Protein: 5 g/dL — ABNORMAL LOW (ref 6.5–8.1)

## 2024-04-26 LAB — URINALYSIS, W/ REFLEX TO CULTURE (INFECTION SUSPECTED)
Bilirubin Urine: NEGATIVE
Glucose, UA: NEGATIVE mg/dL
Ketones, ur: 20 mg/dL — AB
Nitrite: NEGATIVE
Protein, ur: NEGATIVE mg/dL
Specific Gravity, Urine: 1.023 (ref 1.005–1.030)
WBC, UA: >50 WBC/hpf (ref 0–5)
pH: 5 (ref 5.0–8.0)

## 2024-04-26 LAB — MAGNESIUM: Magnesium: 1.6 mg/dL — ABNORMAL LOW (ref 1.7–2.4)

## 2024-04-26 LAB — PROTIME-INR
INR: 1.1 (ref 0.8–1.2)
Prothrombin Time: 14.5 s (ref 11.4–15.2)

## 2024-04-26 MED ORDER — LEVETIRACETAM 500 MG PO TABS
1000.0000 mg | ORAL_TABLET | Freq: Once | ORAL | Status: AC
  Administered 2024-04-26: 1000 mg via ORAL
  Filled 2024-04-26: qty 2

## 2024-04-26 MED ORDER — LACTATED RINGERS IV BOLUS (SEPSIS)
1000.0000 mL | Freq: Once | INTRAVENOUS | Status: AC
  Administered 2024-04-26: 1000 mL via INTRAVENOUS

## 2024-04-26 MED ORDER — POTASSIUM CHLORIDE CRYS ER 20 MEQ PO TBCR
40.0000 meq | EXTENDED_RELEASE_TABLET | Freq: Once | ORAL | Status: AC
  Administered 2024-04-26: 40 meq via ORAL
  Filled 2024-04-26: qty 2

## 2024-04-26 MED ORDER — MORPHINE SULFATE (PF) 4 MG/ML IV SOLN
4.0000 mg | Freq: Once | INTRAVENOUS | Status: AC
  Administered 2024-04-26: 4 mg via INTRAVENOUS
  Filled 2024-04-26: qty 1

## 2024-04-26 MED ORDER — MAGNESIUM SULFATE 2 GM/50ML IV SOLN
2.0000 g | Freq: Once | INTRAVENOUS | Status: AC
  Administered 2024-04-26: 2 g via INTRAVENOUS
  Filled 2024-04-26: qty 50

## 2024-04-26 MED ORDER — IOHEXOL 300 MG/ML  SOLN
100.0000 mL | Freq: Once | INTRAMUSCULAR | Status: AC | PRN
  Administered 2024-04-26: 100 mL via INTRAVENOUS

## 2024-04-26 MED ORDER — IPRATROPIUM-ALBUTEROL 0.5-2.5 (3) MG/3ML IN SOLN
3.0000 mL | Freq: Once | RESPIRATORY_TRACT | Status: AC
  Administered 2024-04-26: 3 mL via RESPIRATORY_TRACT
  Filled 2024-04-26: qty 3

## 2024-04-26 MED ORDER — METHYLPREDNISOLONE SODIUM SUCC 125 MG IJ SOLR
125.0000 mg | Freq: Once | INTRAMUSCULAR | Status: AC
  Administered 2024-04-26: 125 mg via INTRAVENOUS
  Filled 2024-04-26: qty 2

## 2024-04-26 MED ORDER — ONDANSETRON HCL 4 MG/2ML IJ SOLN
4.0000 mg | Freq: Once | INTRAMUSCULAR | Status: AC
  Administered 2024-04-26: 4 mg via INTRAVENOUS
  Filled 2024-04-26: qty 2

## 2024-04-26 MED ORDER — SODIUM CHLORIDE 0.9 % IV SOLN
500.0000 mg | Freq: Once | INTRAVENOUS | Status: AC
  Administered 2024-04-26: 500 mg via INTRAVENOUS
  Filled 2024-04-26: qty 5

## 2024-04-26 MED ORDER — LACTATED RINGERS IV SOLN: INTRAVENOUS | Status: DC

## 2024-04-26 MED ORDER — SODIUM CHLORIDE 0.9 % IV SOLN
2.0000 g | Freq: Once | INTRAVENOUS | Status: AC
  Administered 2024-04-26: 2 g via INTRAVENOUS
  Filled 2024-04-26: qty 20

## 2024-04-26 MED ORDER — CALCIUM GLUCONATE-NACL 1-0.675 GM/50ML-% IV SOLN
1.0000 g | Freq: Once | INTRAVENOUS | Status: AC
  Administered 2024-04-26: 1000 mg via INTRAVENOUS
  Filled 2024-04-26: qty 50

## 2024-04-26 NOTE — Patient Instructions (Signed)
 We are sorry you are not feeling well. We have prescribed Zithromax  Z Pak 2 tabs day 1 followed by one tab days 2-5. Please stay well hydrated. Call if symptoms worsen or not improved in 48 hours.

## 2024-04-26 NOTE — ED Provider Notes (Addendum)
 [place] EMERGENCY DEPARTMENT AT [facility] Provider Note   CSN: [id number] Arrival date & time: 04/26/24  1617     Patient presents with: Weakness   Virginia [name] is a 68 y.o. female.   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  Pt last given Keytruda  on 7/25.  Chemo not given that round due to TPS score of 90%.  Pt has had a cough and uri sx for the past few days.  She did 2 otc covid/flu tests and they were neg.  She had a video visit with pcp on 8/15 and was rx'd a Zpack.  Sx are worsening.  She feels very tired.  She has not been able to eat/drink.          Prior to Admission medications   Medication Sig Start Date End Date Taking? Authorizing Provider  acetaminophen  (TYLENOL ) 325 MG tablet Take 1-2 tablets (325-650 mg total) by mouth every 4 (four) hours as needed for mild pain (pain score 1-3). Patient taking differently: Take 500 mg by mouth every 4 (four) hours as needed for mild pain (pain score 1-3). 07/01/23   Setzer, Sandra J, [name]  albuterol  (VENTOLIN  HFA) 108 (90 Base) MCG/ACT inhaler INHALE 2 PUFFS INTO THE LUNGS EVERY 6 HOURS AS NEEDED FOR WHEEZING OR SHORTNESS OF BREATH 12/09/23   Perri Ronal PARAS, MD  ALPRAZolam  (XANAX ) 0.25 MG tablet Take 1 tablet (0.25 mg total) by mouth 2 (two) times daily as needed for anxiety. 01/28/24   Pickenpack-Cousar, Athena N, NP  azithromycin  (ZITHROMAX ) 250 MG tablet Take 2 tablets on day 1, then 1 tablet daily on days 2 through 5 04/24/24 04/29/24  Perri Ronal PARAS, MD  Cholecalciferol  (VITAMIN D3) 125 MCG (5000 UT) TABS Take 5,000 Units by mouth daily.    [provider]  colchicine  0.6 MG tablet Take 1 tablet (0.6 mg total) by mouth daily. 03/04/24   Wyn Jackee VEAR Mickey., NP  Cyanocobalamin  (VITAMIN B12 PO) Take 5,000 mcg by mouth daily. Gummies    [provider]  dexamethasone  (DECADRON )  1 MG tablet Take 1 tablet (1 mg total) by mouth daily. Patient taking differently: Take 0.5 mg by mouth daily. 01/27/24   Vaslow, Zachary K, MD  furosemide  (LASIX ) 20 MG tablet Take 1 tablet (20 mg total) by mouth 2 (two) times a week. Tues & Thurs 02/17/24 05/17/24  Dick, Ernest H Jr., NP  hydrocortisone  (ANUSOL -HC) 2.5 % rectal cream use in rectal area 2 to 3 times a day as needed 05/24/23   Perri Ronal PARAS, MD  levETIRAcetam  (KEPPRA ) 1000 MG tablet Take 1 tablet (1,000 mg total) by mouth 2 (two) times daily. 12/09/23   Vaslow, Zachary K, MD  magnesium  oxide (MAG-OX) 400 (240 Mg) MG tablet Take 1 tablet (400 mg total) by mouth 2 (two) times daily. 07/01/23   Setzer, Sandra J, [name]  meclizine  (ANTIVERT ) 50 MG tablet Take 1 tablet (50 mg total) by mouth 3 (three) times daily. Patient taking differently: Take 100 mg by mouth 3 (three) times daily. As needed 07/29/23   Vaslow, Zachary K, MD  metoprolol  succinate (TOPROL -XL) 25 MG 24 hr tablet Take 1 tablet (25 mg total) by mouth daily. 07/26/23   Jerilynn Lamarr HERO, NP  Multiple Vitamin (MULTIVITAMIN WITH MINERALS) TABS tablet Take 2 tablets by mouth daily. Gummy  [provider]  Nutritional Supplements (,FEEDING SUPPLEMENT, PROSOURCE PLUS) liquid Take 30 mLs by mouth 2 (two) times daily between meals. Patient taking differently: Take 30 mLs by mouth 4 (four) times daily. Berry flavor 07/01/23   Setzer, Nena PARAS, [name]  OLANZapine  (ZYPREXA ) 2.5 MG tablet Take 1 tablet (2.5 mg total) by mouth at bedtime. 07/29/23   Federico Norleen ONEIDA MADISON, MD  ondansetron  (ZOFRAN ) 4 MG tablet TAKE 1 TABLET(4 MG) BY MOUTH EVERY 8 HOURS AS NEEDED FOR NAUSEA OR VOMITING 01/30/23   Dorsey, John T IV, MD  oxyCODONE  (OXY IR/ROXICODONE ) 5 MG immediate release tablet Take 1 tablet (5 mg total) by mouth every 4 (four) hours as needed for severe pain (pain score 7-10). 04/14/24   Pickenpack-Cousar, Fannie SAILOR, NP  pantoprazole  (PROTONIX ) 40 MG tablet TAKE 1 TABLET(40 MG) BY MOUTH DAILY  09/12/23   Perri Ronal PARAS, MD  polyethylene glycol (MIRALAX  / GLYCOLAX ) 17 g packet Take 17 g by mouth daily as needed for moderate constipation or mild constipation.    [provider]  potassium chloride  SA (KLOR-CON  M) 20 MEQ tablet TAKE 1 TABLET(20 MEQ) BY MOUTH TWICE DAILY 01/02/24   Dorsey, John T IV, MD  senna-docusate (SENOKOT-S) 8.6-50 MG tablet Take 2 tablets by mouth daily as needed (Patient preference, Sennakot S for mild constipation). Patient taking differently: Take 2 tablets by mouth daily as needed for mild constipation. 2 tabs 2 x daily 07/01/23   Setzer, Sandra J, [name]  spironolactone  (ALDACTONE ) 25 MG tablet Take 1 tablet (25 mg total) by mouth daily. 07/26/23   Jerilynn Lamarr HERO, NP  XTAMPZA  ER 13.5 MG C12A Take 1 capsule by mouth in the morning and at bedtime. 04/16/24   Pickenpack-Cousar, Fannie SAILOR, NP    Allergies: Scopolamine     Review of Systems  Respiratory:  Positive for cough and shortness of breath.   All other systems reviewed and are negative.   Updated Vital Signs BP (!) 125/94   Pulse (!) 101   Temp 98.7 F (37.1 C)   Resp 20   Ht 5' 2 (1.575 m)   Wt 84 kg   SpO2 92%   BMI 33.87 kg/m   Physical Exam Vitals and nursing note reviewed.  Constitutional:      Appearance: Normal appearance. She is ill-appearing.  HENT:     Head: Normocephalic and atraumatic.     Right Ear: External ear normal.     Left Ear: External ear normal.     Nose: Nose normal.     Mouth/Throat:     Mouth: Mucous membranes are dry.  Eyes:     Extraocular Movements: Extraocular movements intact.     Conjunctiva/sclera: Conjunctivae normal.     Pupils: Pupils are equal, round, and reactive to light.  Cardiovascular:     Rate and Rhythm: Regular rhythm. Tachycardia present.     Pulses: Normal pulses.     Heart sounds: Normal heart sounds.  Pulmonary:     Effort: Tachypnea present.     Breath sounds: Wheezing and rhonchi present.  Abdominal:     General: Abdomen  is flat. Bowel sounds are normal.     Palpations: Abdomen is soft.  Musculoskeletal:     Cervical back: Normal range of motion and neck supple.  Skin:    General: Skin is warm.     Capillary Refill: Capillary refill takes less than 2 seconds.  Neurological:     Mental Status: She is alert and oriented to person,  place, and time.     Comments: LUE weakness (chronic)  Psychiatric:        Mood and Affect: Mood normal.     (all labs ordered are listed, but only abnormal results are displayed) Labs Reviewed  CBC WITH DIFFERENTIAL/PLATELET - Abnormal; Notable for the following components:      Result Value   Monocytes Absolute 1.6 (*)    All other components within normal limits  URINALYSIS, W/ REFLEX TO CULTURE (INFECTION SUSPECTED) - Abnormal; Notable for the following components:   APPearance HAZY (*)    Hgb urine dipstick MODERATE (*)    Ketones, ur 20 (*)    Leukocytes,Ua LARGE (*)    Bacteria, UA FEW (*)    Non Squamous Epithelial 0-5 (*)    All other components within normal limits  COMPREHENSIVE METABOLIC PANEL WITH GFR - Abnormal; Notable for the following components:   Potassium 3.0 (*)    CO2 20 (*)    Calcium  7.3 (*)    Total Protein 5.0 (*)    Albumin 2.2 (*)    AST 14 (*)    All other components within normal limits  MAGNESIUM  - Abnormal; Notable for the following components:   Magnesium  1.6 (*)    All other components within normal limits  CULTURE, BLOOD (ROUTINE X 2)  RESP PANEL BY RT-PCR (RSV, FLU A&B, COVID)  [id number]  CULTURE, BLOOD (ROUTINE X 2)  URINE CULTURE  PROTIME-INR  I-STAT CG4 LACTIC ACID, ED    EKG: EKG Interpretation Date/Time:  Sunday April 26 2024 16:58:33 EDT Ventricular Rate:  99 PR Interval:  189 QRS Duration:  106 QT Interval:  393 QTC Calculation: 505 R Axis:   -10  Text Interpretation: Sinus rhythm Ventricular premature complex Low voltage, precordial leads Borderline T abnormalities, anterior leads Prolonged QT interval No  significant change since last tracing Confirmed by Dean Clarity [phone]) on 04/26/2024 8:03:51 PM  Radiology: CT CHEST ABDOMEN PELVIS W CONTRAST Result Date: 04/26/2024 CLINICAL DATA:  Sepsis, cough, generalized weakness, diarrhea, history of lung cancer metastasized to the brain. Unable to keep fluids down EXAM: CT CHEST, ABDOMEN, AND PELVIS WITH CONTRAST TECHNIQUE: Multidetector CT imaging of the chest, abdomen and pelvis was performed following the standard protocol during bolus administration of intravenous contrast. RADIATION DOSE REDUCTION: This exam was performed according to the departmental dose-optimization program which includes automated exposure control, adjustment of the mA and/or kV according to patient size and/or use of iterative reconstruction technique. CONTRAST:  OMNIPAQUE  IOHEXOL  300 MG/ML  SOLN COMPARISON:  Same day chest radiograph and CT 01/27/2024 FINDINGS: CT CHEST FINDINGS Cardiovascular: Similar large pericardial effusion. Coronary artery and aortic atherosclerotic calcification. Mediastinum/Nodes: Trachea and esophagus are unremarkable. No pathologic adenopathy. Lungs/Pleura: Lingular and bilateral lower lobe consolidation with low-density fluid. Numerous scattered centrilobular ground-glass nodules are likely infectious/inflammatory and have progressed since 01/27/2024 trace right pleural effusion. No pneumothorax Musculoskeletal: No acute fracture. Chronic T8 compression fracture status post vertebroplasty. CT ABDOMEN PELVIS FINDINGS Hepatobiliary: Cholecystectomy. Unremarkable liver and biliary tree. Pancreas: Unremarkable. Spleen: Unremarkable. Adrenals/Urinary Tract: Normal adrenal glands. No urinary calculi or hydronephrosis. Unremarkable bladder. Stomach/Bowel: Normal caliber large and small bowel. No bowel wall thickening. Normal appendix. Stomach is within normal limits. Vascular/Lymphatic: Advanced aortic atherosclerotic calcification. No pathologic adenopathy.  Reproductive: Hysterectomy.  No adnexal mass. Other: No free intraperitoneal fluid or air. Musculoskeletal: No acute fracture. IMPRESSION: 1. Multifocal pneumonia. 2. Similar large pericardial effusion. 3. No acute abnormality in the abdomen or pelvis. 4. Aortic Atherosclerosis (ICD10-I70.0).  Electronically Signed   By: Norman Gatlin M.D.   On: 04/26/2024 20:40   DG Chest Port 1 View Result Date: 04/26/2024 CLINICAL DATA:  Questionable sepsis. EXAM: PORTABLE CHEST 1 VIEW COMPARISON:  08/26/2023 , CT 01/27/2019 FINDINGS: Stable large cardiac silhouette. LEFT lobe atelectasis. RIGHT lung clear. No effusion or infiltrate. IMPRESSION: 1. Persistent enlarged cardiac silhouette. Potential pericardial effusion. Pericardial effusion on comparison CT 2. LEFT lobe atelectasis. Electronically Signed   By: Jackquline Boxer M.D.   On: 04/26/2024 17:26     Procedures   Medications Ordered in the ED  lactated ringers  infusion ( Intravenous New Bag/Given 04/26/24 1657)  magnesium  sulfate IVPB 2 g 50 mL (2 g Intravenous New Bag/Given 04/26/24 2221)  calcium  gluconate 1 g/ 50 mL sodium chloride  IVPB (1,000 mg Intravenous New Bag/Given 04/26/24 2218)  lactated ringers  bolus 1,000 mL (0 mLs Intravenous Stopped 04/26/24 1759)  cefTRIAXone  (ROCEPHIN ) 2 g in sodium chloride  0.9 % 100 mL IVPB (0 g Intravenous Stopped 04/26/24 1740)  azithromycin  (ZITHROMAX ) 500 mg in sodium chloride  0.9 % 250 mL IVPB (0 mg Intravenous Stopped 04/26/24 1759)  methylPREDNISolone  sodium succinate (SOLU-MEDROL ) 125 mg/2 mL injection 125 mg (125 mg Intravenous Given 04/26/24 1657)  ipratropium-albuterol  (DUONEB) 0.5-2.5 (3) MG/3ML nebulizer solution 3 mL (3 mLs Nebulization Given 04/26/24 1657)  iohexol  (OMNIPAQUE ) 300 MG/ML solution 100 mL (100 mLs Intravenous Contrast Given 04/26/24 2015)  morphine  (PF) 4 MG/ML injection 4 mg (4 mg Intravenous Given 04/26/24 2135)  ondansetron  (ZOFRAN ) injection 4 mg (4 mg Intravenous Given 04/26/24 2135)   levETIRAcetam  (KEPPRA ) tablet 1,000 mg (1,000 mg Oral Given 04/26/24 2134)  potassium chloride  SA (KLOR-CON  M) CR tablet 40 mEq (40 mEq Oral Given 04/26/24 2135)                                    Medical Decision Making Amount and/or Complexity of Data Reviewed Labs: ordered. Radiology: ordered.  Risk Prescription drug management. Decision regarding hospitalization.   This patient presents to the ED for concern of sob, this involves an extensive number of treatment options, and is a complaint that carries with it a high risk of complications and morbidity.  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  The pertinent results include:  cbc nl; cmp with k low at 3, mg low at 1.6, calcium  low at 7.3; ua + uti   Imaging Studies ordered:  I ordered imaging studies including cxr, ct chest/abd/pelvis  I independently visualized and interpreted imaging which showed  CXR: Persistent enlarged cardiac silhouette. Potential pericardial  effusion. Pericardial effusion on comparison CT  2. LEFT lobe atelectasis.  CT chest/abd/pelvis:  Multifocal pneumonia.  2. Similar large pericardial effusion.  3. No acute abnormality in the abdomen or pelvis.  4. Aortic Atherosclerosis (ICD10-I70.0).   I agree with the radiologist interpretation   Cardiac Monitoring:  The patient was maintained on a cardiac monitor.  I personally viewed and interpreted the cardiac monitored which showed an underlying rhythm of: st   Medicines ordered and prescription  drug  management:  I ordered medication including rocephin /zithromax /nebs/solumedrol  for sx  Reevaluation of the patient after these medicines showed that the patient improved I have reviewed the patients home medicines and have made adjustments as needed   Test Considered:  ct   Critical Interventions:  Abx/ivfs   Consultations Obtained:  I requested consultation with the hospitalist (Dr. Alfornia),  and discussed lab and imaging findings as well as pertinent plan - she will admit   Problem List / ED Course:  Sepsis with multifocal pna and hypoxia:  O2 sat in the mid-90s on 2L.  Code sepsis called.  Pt given ivfs and iv abx.   Pericardial effusion:  similar to CT in May Hypokalemia:  oral kdur given Hypomagnesemia:  iv mg given Hypocalcemia:  iv calcium  given UTI:  pt given rocephin    Reevaluation:  After the interventions noted above, I reevaluated the patient and found that they have :improved   Social Determinants of Health:  Lives at home   Dispostion:  After consideration of the diagnostic results and the patients response to treatment, I feel that the patent would benefit from admission.  CRITICAL CARE Performed by: Mliss Boyers   Total critical care time: 30 minutes  Critical care time was exclusive of separately billable procedures and treating other patients.  Critical care was necessary to treat or prevent imminent or life-threatening deterioration.  Critical care was time spent personally by me on the following activities: development of treatment plan with patient and/or surrogate as well as nursing, discussions with consultants, evaluation of patient's response to treatment, examination of patient, obtaining history from patient or surrogate, ordering and performing treatments and interventions, ordering and review of laboratory studies, ordering and review of radiographic studies, pulse oximetry and re-evaluation of patient's condition.         Final diagnoses:  Multifocal pneumonia  Pericardial effusion  Hypokalemia  Sepsis with acute hypoxic respiratory failure without septic shock, due to unspecified organism [facility])  Hypomagnesemia  Hypocalcemia  Acute cystitis without hematuria    ED Discharge Orders     None          Boyers Mliss, MD 04/26/24 2158    Boyers Mliss, MD 04/26/24 2247

## 2024-04-26 NOTE — Sepsis Progress Note (Signed)
 Sepsis protocol is being followed by [facility].

## 2024-04-26 NOTE — ED Triage Notes (Addendum)
 Pt bib EMS from home. 20 RAC. Cough and generalized weakness. Zpack prescribed Friday. Increased weakness. Diarrhea since starting zpack. Hx lung cancer and metastasized to brain. Negative covid test last night. Hasn't been able to drink and keep fluids down. CBG 115

## 2024-04-27 ENCOUNTER — Ambulatory Visit (HOSPITAL_COMMUNITY)

## 2024-04-27 ENCOUNTER — Encounter: Payer: Self-pay | Admitting: Physician Assistant

## 2024-04-27 ENCOUNTER — Encounter (HOSPITAL_COMMUNITY): Payer: Self-pay

## 2024-04-27 DIAGNOSIS — R531 Weakness: Secondary | ICD-10-CM | POA: Diagnosis present

## 2024-04-27 DIAGNOSIS — E118 Type 2 diabetes mellitus with unspecified complications: Secondary | ICD-10-CM | POA: Diagnosis present

## 2024-04-27 DIAGNOSIS — B348 Other viral infections of unspecified site: Secondary | ICD-10-CM | POA: Diagnosis not present

## 2024-04-27 DIAGNOSIS — F419 Anxiety disorder, unspecified: Secondary | ICD-10-CM | POA: Diagnosis present

## 2024-04-27 DIAGNOSIS — J9601 Acute respiratory failure with hypoxia: Secondary | ICD-10-CM | POA: Diagnosis present

## 2024-04-27 DIAGNOSIS — R5381 Other malaise: Secondary | ICD-10-CM | POA: Diagnosis not present

## 2024-04-27 DIAGNOSIS — I110 Hypertensive heart disease with heart failure: Secondary | ICD-10-CM | POA: Diagnosis present

## 2024-04-27 DIAGNOSIS — Z79899 Other long term (current) drug therapy: Secondary | ICD-10-CM | POA: Diagnosis not present

## 2024-04-27 DIAGNOSIS — Z923 Personal history of irradiation: Secondary | ICD-10-CM | POA: Diagnosis not present

## 2024-04-27 DIAGNOSIS — C7931 Secondary malignant neoplasm of brain: Secondary | ICD-10-CM | POA: Diagnosis present

## 2024-04-27 DIAGNOSIS — Z8249 Family history of ischemic heart disease and other diseases of the circulatory system: Secondary | ICD-10-CM | POA: Diagnosis not present

## 2024-04-27 DIAGNOSIS — G894 Chronic pain syndrome: Secondary | ICD-10-CM | POA: Diagnosis present

## 2024-04-27 DIAGNOSIS — Z1152 Encounter for screening for COVID-19: Secondary | ICD-10-CM | POA: Diagnosis not present

## 2024-04-27 DIAGNOSIS — I428 Other cardiomyopathies: Secondary | ICD-10-CM | POA: Diagnosis present

## 2024-04-27 DIAGNOSIS — N390 Urinary tract infection, site not specified: Secondary | ICD-10-CM

## 2024-04-27 DIAGNOSIS — E876 Hypokalemia: Secondary | ICD-10-CM | POA: Diagnosis present

## 2024-04-27 DIAGNOSIS — G40909 Epilepsy, unspecified, not intractable, without status epilepticus: Secondary | ICD-10-CM | POA: Diagnosis present

## 2024-04-27 DIAGNOSIS — Z86718 Personal history of other venous thrombosis and embolism: Secondary | ICD-10-CM | POA: Diagnosis not present

## 2024-04-27 DIAGNOSIS — Z87891 Personal history of nicotine dependence: Secondary | ICD-10-CM | POA: Diagnosis not present

## 2024-04-27 DIAGNOSIS — J189 Pneumonia, unspecified organism: Principal | ICD-10-CM | POA: Diagnosis present

## 2024-04-27 DIAGNOSIS — I5022 Chronic systolic (congestive) heart failure: Secondary | ICD-10-CM | POA: Diagnosis present

## 2024-04-27 DIAGNOSIS — E7800 Pure hypercholesterolemia, unspecified: Secondary | ICD-10-CM | POA: Diagnosis present

## 2024-04-27 DIAGNOSIS — I3139 Other pericardial effusion (noninflammatory): Secondary | ICD-10-CM | POA: Diagnosis present

## 2024-04-27 DIAGNOSIS — K219 Gastro-esophageal reflux disease without esophagitis: Secondary | ICD-10-CM | POA: Insufficient documentation

## 2024-04-27 DIAGNOSIS — J440 Chronic obstructive pulmonary disease with acute lower respiratory infection: Secondary | ICD-10-CM | POA: Diagnosis present

## 2024-04-27 DIAGNOSIS — I351 Nonrheumatic aortic (valve) insufficiency: Secondary | ICD-10-CM | POA: Diagnosis present

## 2024-04-27 LAB — RESPIRATORY PANEL BY PCR

## 2024-04-27 LAB — BASIC METABOLIC PANEL WITH GFR
Anion gap: 13 (ref 5–15)
BUN: 21 mg/dL (ref 8–23)
CO2: 21 mmol/L — ABNORMAL LOW (ref 22–32)
Calcium: 8.6 mg/dL — ABNORMAL LOW (ref 8.9–10.3)
Chloride: 105 mmol/L (ref 98–111)
Creatinine, Ser: 0.98 mg/dL (ref 0.44–1.00)
GFR, Estimated: >60 mL/min (ref >60)
Glucose, Bld: 149 mg/dL — ABNORMAL HIGH (ref 70–99)
Potassium: 4.2 mmol/L (ref 3.5–5.1)
Sodium: 139 mmol/L (ref 135–145)

## 2024-04-27 LAB — GLUCOSE, CAPILLARY
Glucose-Capillary: 135 mg/dL — ABNORMAL HIGH (ref 70–99)
Glucose-Capillary: 107 mg/dL — ABNORMAL HIGH (ref 70–99)
Glucose-Capillary: 207 mg/dL — ABNORMAL HIGH (ref 70–99)
Glucose-Capillary: 123 mg/dL — ABNORMAL HIGH (ref 70–99)
Glucose-Capillary: 121 mg/dL — ABNORMAL HIGH (ref 70–99)

## 2024-04-27 LAB — MAGNESIUM: Magnesium: 2.1 mg/dL (ref 1.7–2.4)

## 2024-04-27 LAB — PROCALCITONIN: Procalcitonin: <0.1 ng/mL

## 2024-04-27 MED ORDER — PROSOURCE PLUS PO LIQD
30.0000 mL | Freq: Four times a day (QID) | ORAL | Status: DC
  Administered 2024-04-27 – 2024-05-01 (×15): 30 mL via ORAL
  Filled 2024-04-27 (×17): qty 30

## 2024-04-27 MED ORDER — INSULIN ASPART 100 UNIT/ML IJ SOLN
0.0000 [IU] | Freq: Three times a day (TID) | INTRAMUSCULAR | Status: DC
  Administered 2024-04-27: 3 [IU] via SUBCUTANEOUS
  Administered 2024-04-27 – 2024-04-29 (×3): 1 [IU] via SUBCUTANEOUS
  Filled 2024-04-27: qty 0.09

## 2024-04-27 MED ORDER — ALPRAZOLAM 0.25 MG PO TABS: 0.2500 mg | ORAL_TABLET | Freq: Two times a day (BID) | ORAL | Status: DC | PRN

## 2024-04-27 MED ORDER — DEXAMETHASONE 0.5 MG PO TABS
0.5000 mg | ORAL_TABLET | Freq: Every day | ORAL | Status: DC
  Administered 2024-04-27 – 2024-05-01 (×5): 0.5 mg via ORAL
  Filled 2024-04-27 (×5): qty 1

## 2024-04-27 MED ORDER — FUROSEMIDE 20 MG PO TABS
20.0000 mg | ORAL_TABLET | ORAL | Status: DC
  Administered 2024-04-27: 20 mg via ORAL
  Filled 2024-04-27 (×2): qty 1

## 2024-04-27 MED ORDER — COLCHICINE 0.6 MG PO TABS
0.6000 mg | ORAL_TABLET | Freq: Every day | ORAL | Status: DC
  Administered 2024-04-27 – 2024-05-01 (×5): 0.6 mg via ORAL
  Filled 2024-04-27 (×6): qty 1

## 2024-04-27 MED ORDER — SODIUM CHLORIDE 0.9 % IV SOLN
100.0000 mg | Freq: Two times a day (BID) | INTRAVENOUS | Status: DC
  Administered 2024-04-27 – 2024-04-29 (×4): 100 mg via INTRAVENOUS
  Filled 2024-04-27 (×4): qty 100

## 2024-04-27 MED ORDER — INSULIN ASPART 100 UNIT/ML IJ SOLN
0.0000 [IU] | Freq: Every day | INTRAMUSCULAR | Status: DC
  Filled 2024-04-27: qty 0.05

## 2024-04-27 MED ORDER — LEVETIRACETAM 500 MG PO TABS
1000.0000 mg | ORAL_TABLET | Freq: Two times a day (BID) | ORAL | Status: DC
  Administered 2024-04-27 – 2024-05-01 (×9): 1000 mg via ORAL
  Filled 2024-04-27 (×9): qty 2

## 2024-04-27 MED ORDER — ACETAMINOPHEN 650 MG RE SUPP: 650.0000 mg | Freq: Four times a day (QID) | RECTAL | Status: DC | PRN

## 2024-04-27 MED ORDER — OXYCODONE HCL ER 15 MG PO T12A
15.0000 mg | EXTENDED_RELEASE_TABLET | Freq: Two times a day (BID) | ORAL | Status: DC
  Administered 2024-04-27 – 2024-05-01 (×9): 15 mg via ORAL
  Filled 2024-04-27 (×9): qty 1

## 2024-04-27 MED ORDER — PANTOPRAZOLE SODIUM 40 MG PO TBEC
40.0000 mg | DELAYED_RELEASE_TABLET | Freq: Every day | ORAL | Status: DC
Start: 2024-04-27 — End: 2024-05-02
  Administered 2024-04-27 – 2024-05-01 (×5): 40 mg via ORAL
  Filled 2024-04-27 (×6): qty 1

## 2024-04-27 MED ORDER — IPRATROPIUM-ALBUTEROL 0.5-2.5 (3) MG/3ML IN SOLN
3.0000 mL | Freq: Four times a day (QID) | RESPIRATORY_TRACT | Status: DC | PRN
  Administered 2024-04-29: 3 mL via RESPIRATORY_TRACT
  Filled 2024-04-27: qty 3

## 2024-04-27 MED ORDER — METOPROLOL SUCCINATE ER 25 MG PO TB24
25.0000 mg | ORAL_TABLET | Freq: Every day | ORAL | Status: DC
Start: 2024-04-27 — End: 2024-04-28
  Administered 2024-04-27: 25 mg via ORAL
  Filled 2024-04-27 (×3): qty 1

## 2024-04-27 MED ORDER — ALBUTEROL SULFATE HFA 108 (90 BASE) MCG/ACT IN AERS: 2.0000 | INHALATION_SPRAY | Freq: Four times a day (QID) | RESPIRATORY_TRACT | Status: DC | PRN

## 2024-04-27 MED ORDER — ALBUTEROL SULFATE (2.5 MG/3ML) 0.083% IN NEBU: 2.5000 mg | INHALATION_SOLUTION | Freq: Four times a day (QID) | RESPIRATORY_TRACT | Status: DC | PRN

## 2024-04-27 MED ORDER — ACETAMINOPHEN 325 MG PO TABS: 650.0000 mg | ORAL_TABLET | Freq: Four times a day (QID) | ORAL | Status: DC | PRN

## 2024-04-27 MED ORDER — OXYCODONE HCL 5 MG PO TABS
5.0000 mg | ORAL_TABLET | ORAL | Status: DC | PRN
  Administered 2024-04-27 – 2024-05-01 (×11): 5 mg via ORAL
  Filled 2024-04-27 (×11): qty 1

## 2024-04-27 MED ORDER — SPIRONOLACTONE 25 MG PO TABS
25.0000 mg | ORAL_TABLET | Freq: Every day | ORAL | Status: DC
Start: 2024-04-27 — End: 2024-04-28
  Administered 2024-04-27: 25 mg via ORAL
  Filled 2024-04-27 (×3): qty 1

## 2024-04-27 MED ORDER — MECLIZINE HCL 25 MG PO TABS
25.0000 mg | ORAL_TABLET | Freq: Three times a day (TID) | ORAL | Status: DC | PRN
  Administered 2024-04-27: 25 mg via ORAL
  Filled 2024-04-27: qty 1

## 2024-04-27 MED ORDER — NALOXONE HCL 0.4 MG/ML IJ SOLN: 0.4000 mg | INTRAMUSCULAR | Status: DC | PRN

## 2024-04-27 MED ORDER — SODIUM CHLORIDE 0.9 % IV SOLN
2.0000 g | INTRAVENOUS | Status: DC
  Administered 2024-04-27 – 2024-04-30 (×4): 2 g via INTRAVENOUS
  Filled 2024-04-27 (×4): qty 20

## 2024-04-27 MED ORDER — ACETAMINOPHEN 325 MG PO TABS
650.0000 mg | ORAL_TABLET | Freq: Once | ORAL | Status: AC
  Administered 2024-04-27: 650 mg via ORAL
  Filled 2024-04-27: qty 2

## 2024-04-27 NOTE — Assessment & Plan Note (Signed)
-   Scattered centrilobular ground glass nodules considered infectious/inflammatory showing progression since May 2025 and bilateral lower lobe consolidation noted concerning for multifocal pneumonia -Some tachycardia on admission but very mild.  Afebrile, no leukocytosis - Procalcitonin negative - RVP positive with rhinovirus -Given CT appearance, continue on antibiotics for now especially with some clinical improvement since admission - COVID, flu, RSV negative - Continue on Rocephin  and doxycycline

## 2024-04-27 NOTE — H&P (Signed)
 History and Physical    Virginia [name] FMW:[id number] DOB: 01/03/1956 DOA: 04/26/2024  PCP: Perri Ronal PARAS, MD  Patient coming from: Home  Chief Complaint: Generalized weakness, cough  HPI: Virginia [name] is a 68 y.o. 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  Patient is presenting with complaints of cough, runny nose, sore throat, shortness of breath, and generalized weakness for the past few days.  She had a video visit with her PCP on 8/15 and was prescribed azithromycin  which she has been taking without much improvement.  She denies fevers or chest pain.  Husband states patient has been having loose stools/diarrhea but she does take scheduled laxatives at home as she is on opioids for chronic pain.  Patient missed her immunotherapy treatment on Friday due to feeling sick.  Patient denies nausea, vomiting, or abdominal pain.  She is endorsing dysuria.  ED Course: Slightly tachycardic but afebrile.  Not hypotensive.  Oxygen saturation in the high 80s on room air and placed on 2 L [place].  Labs showing no leukocytosis, potassium 3.0, bicarb 20, glucose 97, creatinine 0.86, calcium  8.7 when corrected for hypoalbuminemia, no elevation of LFTs, blood cultures pending, COVID/influenza/RSV PCR negative, lactic acid normal, magnesium  1.6.  UA with large amount of leukocytes and microscopy showing >50 WBCs and few bacteria.   Urine culture pending.  CT chest showing multifocal pneumonia and stable large pericardial effusion.  CT abdomen pelvis negative for acute abnormality.  Patient was given Tylenol , DuoNeb, Keppra , Solu-Medrol  125 mg, morphine , Zofran , oral potassium, ceftriaxone , azithromycin , calcium  gluconate, IV mag, and 1 L LR.  Review of Systems:  Review of Systems  All other systems reviewed and are negative.   Past Medical History:  Diagnosis Date   Anemia    Anxiety    Concussion 09/28/2019   DVT (deep venous thrombosis) (HCC) 2021   L leg   Dyspnea    GERD (gastroesophageal reflux disease)    Hypercholesterolemia    per pt, she does not have elevated lipids   Hypertension    met lung ca dx'd 09/2019   mets to spine, hip and brain   PONV (postoperative nausea and vomiting)    Tobacco abuse     Past Surgical History:  Procedure Laterality Date   ABDOMINAL HYSTERECTOMY     partial/ left ovaries   APPLICATION OF CRANIAL NAVIGATION N/A 12/02/2020   Procedure: APPLICATION OF CRANIAL NAVIGATION;  Surgeon: [name], MD;  Location: [facility] OR;  Service: Neurosurgery;  Laterality: N/A;   CHOLECYSTECTOMY     CRANIOTOMY N/A 12/02/2020   Procedure: Posterior fossa craniotomy for tumor resection with brainlab;  Surgeon: [name], MD;  Location: [facility] OR;  Service: Neurosurgery;  Laterality: N/A;   DILATION AND CURETTAGE OF UTERUS     IR IMAGING GUIDED PORT INSERTION  10/23/2019   IR PATIENT EVAL TECH 0-60 MINS  11/21/2021   IR PATIENT EVAL TECH 0-60 MINS  11/24/2021   IR PATIENT EVAL TECH 0-60 MINS  11/29/2021   IR PATIENT EVAL TECH  0-60 MINS  12/04/2021   IR PATIENT EVAL TECH 0-60 MINS  12/12/2021   IR PATIENT EVAL TECH 0-60 MINS  12/19/2021   IR PATIENT EVAL TECH 0-60 MINS  12/27/2021   IR PATIENT EVAL TECH 0-60 MINS  01/03/2022   IR PATIENT EVAL TECH 0-60 MINS  01/10/2022   IR PATIENT EVAL TECH 0-60 MINS  01/18/2022   IR PATIENT EVAL TECH 0-60 MINS  02/13/2022   IR PATIENT EVAL TECH 0-60 MINS  02/20/2022    IR RADIOLOGIST EVAL & MGMT  01/23/2022   IR REMOVAL TUN ACCESS W/ PORT W/O FL MOD SED  11/17/2021   KYPHOPLASTY N/A 03/15/2020   Procedure: Thoracic Eight KYPHOPLASTY;  Surgeon: [name], MD;  Location: [facility] OR;  Service: Neurosurgery;  Laterality: N/A;  prone    LIPOMA EXCISION  2018   removed under left breast and right thigh.   PERICARDIOCENTESIS N/A 12/10/2023   Procedure: PERICARDIOCENTESIS;  Surgeon: Wendel Lurena POUR, MD;  Location: [facility] INVASIVE CV LAB;  Service: Cardiovascular;  Laterality: N/A;   TUBAL LIGATION       reports that she quit smoking about 11 years ago. Her smoking use included cigarettes. She has never used smokeless tobacco. She reports current alcohol use. She reports that she does not use drugs.  Allergies  Allergen Reactions   Scopolamine  Rash    Family History  Problem Relation Age of Onset   Heart disease Father    Drug abuse Daughter    Drug abuse Son    Cancer Sister    Heart disease Brother    Heart attack Brother     Prior to Admission medications   Medication Sig Start Date End Date Taking? Authorizing Provider  acetaminophen  (TYLENOL ) 325 MG tablet Take 1-2 tablets (325-650 mg total) by mouth every 4 (four) hours as needed for mild pain (pain score 1-3). Patient taking differently: Take 500 mg by mouth every 4 (four) hours as needed for mild pain (pain score 1-3). 07/01/23  Yes Setzer, Sandra J, [name]  albuterol  (VENTOLIN  HFA) 108 (90 Base) MCG/ACT inhaler INHALE 2 PUFFS INTO THE LUNGS EVERY 6 HOURS AS NEEDED FOR WHEEZING OR SHORTNESS OF BREATH 12/09/23  Yes Baxley, Ronal PARAS, MD  ALPRAZolam  (XANAX ) 0.25 MG tablet Take 1 tablet (0.25 mg total) by mouth 2 (two) times daily as needed for anxiety. 01/28/24  Yes Pickenpack-Cousar, Fannie SAILOR, NP  azithromycin  (ZITHROMAX ) 250 MG tablet Take 2 tablets on day 1, then 1 tablet daily on days 2 through 5 04/24/24 04/29/24 Yes Baxley, Ronal PARAS, MD  Cholecalciferol  (VITAMIN D3) 125 MCG (5000 UT) TABS Take 5,000 Units by  mouth every evening.   Yes [provider]  colchicine  0.6 MG tablet Take 1 tablet (0.6 mg total) by mouth daily. 03/04/24  Yes Wyn Jackee VEAR Mickey., NP  Cyanocobalamin  (VITAMIN B12 PO) Take 5,000 mcg by mouth daily. Gummies   Yes [provider]  dexamethasone  (DECADRON ) 1 MG tablet Take 1 tablet (1 mg total) by mouth daily. Patient taking differently: Take 0.5 mg by mouth daily. 01/27/24  Yes Vaslow, Zachary K, MD  furosemide  (LASIX ) 20 MG tablet Take 1 tablet (20 mg total) by mouth 2 (two) times a week. Tues & Thurs Patient taking differently: Take 20 mg by mouth every other day. 02/17/24 05/17/24 Yes Wyn Jackee VEAR Mickey., NP  hydrocortisone  (ANUSOL -HC) 2.5 % rectal cream use in rectal area 2 to 3 times a day as needed 05/24/23  Yes Baxley, Ronal PARAS, MD  levETIRAcetam  (KEPPRA ) 1000 MG tablet Take 1 tablet (1,000 mg total) by mouth 2 (two) times daily. 12/09/23  Yes Vaslow, Zachary K, MD  Magnesium  Glycinate 100 MG CAPS Take 1 capsule by mouth in the morning and at bedtime.   Yes [provider]  meclizine  (ANTIVERT ) 50 MG tablet Take 1 tablet (50 mg total) by mouth 3 (three) times daily. Patient taking differently: Take 100 mg by mouth 3 (three) times daily. As needed 07/29/23  Yes Vaslow, Zachary K, MD  metoprolol  succinate (TOPROL -XL) 25 MG 24 hr tablet Take 1 tablet (25 mg total) by mouth daily. 07/26/23  Yes Jerilynn Lamarr HERO, NP  Multiple Vitamin (MULTIVITAMIN WITH MINERALS) TABS tablet Take 2 tablets by mouth daily. Gummy   Yes [provider]  Nutritional Supplements (,FEEDING SUPPLEMENT, PROSOURCE PLUS) liquid Take 30 mLs by mouth 2 (two) times daily between meals. Patient taking differently: Take 30 mLs by mouth 4 (four) times daily. Berry flavor 07/01/23  Yes Setzer, Nena PARAS, [name]  OLANZapine  (ZYPREXA ) 2.5 MG tablet Take 1 tablet (2.5 mg total) by mouth at bedtime. 07/29/23  Yes Federico Norleen ONEIDA MADISON, MD  ondansetron  (ZOFRAN ) 4 MG tablet TAKE 1 TABLET(4 MG) BY MOUTH  EVERY 8 HOURS AS NEEDED FOR NAUSEA OR VOMITING 01/30/23  Yes Dorsey, John T IV, MD  oxyCODONE  (OXY IR/ROXICODONE ) 5 MG immediate release tablet Take 1 tablet (5 mg total) by mouth every 4 (four) hours as needed for severe pain (pain score 7-10). 04/14/24  Yes Pickenpack-Cousar, Fannie SAILOR, NP  pantoprazole  (PROTONIX ) 40 MG tablet TAKE 1 TABLET(40 MG) BY MOUTH DAILY 09/12/23  Yes Baxley, Ronal PARAS, MD  polyethylene glycol (MIRALAX  / GLYCOLAX ) 17 g packet Take 17 g by mouth daily as needed for moderate constipation or mild constipation.   Yes [provider]  potassium chloride  SA (KLOR-CON  M) 20 MEQ tablet TAKE 1 TABLET(20 MEQ) BY MOUTH TWICE DAILY 01/02/24  Yes Dorsey, John T IV, MD  senna-docusate (SENOKOT-S) 8.6-50 MG tablet Take 2 tablets by mouth daily as needed (Patient preference, Sennakot S for mild constipation). Patient taking differently: Take 2 tablets by mouth daily as needed for mild constipation. 2 tabs 2 x daily 07/01/23  Yes Setzer, Sandra J, [name]  spironolactone  (ALDACTONE ) 25 MG tablet Take 1 tablet (25 mg total) by mouth daily. 07/26/23  Yes Jerilynn Lamarr HERO, NP  XTAMPZA  ER 13.5 MG C12A Take 1 capsule by mouth in the morning and at bedtime. 04/16/24  Yes Pickenpack-Cousar, Fannie SAILOR, NP  magnesium  oxide (MAG-OX) 400 (240 Mg) MG tablet Take 1 tablet (400 mg total) by mouth 2 (two) times daily. 07/01/23   [name], [name]    Physical Exam: Vitals:   04/26/24 1705 04/26/24 1830 04/26/24 2130 04/27/24 0000  BP: (!) 132/98 (!) 126/92 (!) 125/94 118/82  Pulse: 93 96 (!) 101 94  Resp: (!) 28 15 20 14   Temp:  98.8 F (37.1 C) 98.7 F (37.1 C)   TempSrc:      SpO2: 97% 100% 92% 94%  Weight:      Height:        Physical Exam Vitals reviewed.  Constitutional:      General: She is not in acute distress. HENT:     Head: Normocephalic and atraumatic.  Eyes:     Extraocular Movements: Extraocular movements intact.  Cardiovascular:     Rate and Rhythm: Normal rate and  regular rhythm.     Heart sounds: Normal heart sounds.  Pulmonary:  Effort: Pulmonary effort is normal. No respiratory distress.     Breath sounds: No wheezing.  Abdominal:     General: Bowel sounds are normal.     Palpations: Abdomen is soft.     Tenderness: There is no abdominal tenderness. There is no guarding.  Musculoskeletal:     Cervical back: Normal range of motion.     Right lower leg: No edema.     Left lower leg: No edema.  Skin:    General: Skin is warm and dry.  Neurological:     General: No focal deficit present.     Mental Status: She is alert and oriented to person, place, and time.     Labs on Admission: I have personally reviewed following labs and imaging studies  CBC: Recent Labs  Lab 04/26/24 1633  WBC 9.1  NEUTROABS 5.3  HGB 12.8  HCT 38.4  MCV 96.5  PLT 214   Basic Metabolic Panel: Recent Labs  Lab 04/26/24 1903  NA 142  K 3.0*  CL 108  CO2 20*  GLUCOSE 97  BUN 19  CREATININE 0.86  CALCIUM  7.3*  MG 1.6*   GFR: Estimated Creatinine Clearance: 64.7 mL/min (by C-G formula based on SCr of 0.86 mg/dL). Liver Function Tests: Recent Labs  Lab 04/26/24 1903  AST 14*  ALT 13  ALKPHOS 39  BILITOT 0.9  PROT 5.0*  ALBUMIN 2.2*   No results for input(s): LIPASE, AMYLASE in the last 168 hours. No results for input(s): AMMONIA in the last 168 hours. Coagulation Profile: Recent Labs  Lab 04/26/24 1653  INR 1.1   Cardiac Enzymes: No results for input(s): CKTOTAL, CKMB, CKMBINDEX, TROPONINI in the last 168 hours. BNP (last 3 results) No results for input(s): PROBNP in the last 8760 hours. HbA1C: No results for input(s): HGBA1C in the last 72 hours. CBG: No results for input(s): GLUCAP in the last 168 hours. Lipid Profile: No results for input(s): CHOL, HDL, LDLCALC, TRIG, CHOLHDL, LDLDIRECT in the last 72 hours. Thyroid  Function Tests: No results for input(s): TSH, T4TOTAL, FREET4, T3FREE,  THYROIDAB in the last 72 hours. Anemia Panel: No results for input(s): VITAMINB12, FOLATE, FERRITIN, TIBC, IRON, RETICCTPCT in the last 72 hours. Urine analysis:    Component Value Date/Time   COLORURINE YELLOW 04/26/2024 2146   APPEARANCEUR HAZY (A) 04/26/2024 2146   LABSPEC 1.023 04/26/2024 2146   PHURINE 5.0 04/26/2024 2146   GLUCOSEU NEGATIVE 04/26/2024 2146   HGBUR MODERATE (A) 04/26/2024 2146   BILIRUBINUR NEGATIVE 04/26/2024 2146   BILIRUBINUR NEG 06/12/2019 1639   KETONESUR 20 (A) 04/26/2024 2146   PROTEINUR NEGATIVE 04/26/2024 2146   UROBILINOGEN 0.2 06/12/2019 1639   UROBILINOGEN 0.2 04/01/2014 1006   NITRITE NEGATIVE 04/26/2024 2146   LEUKOCYTESUR LARGE (A) 04/26/2024 2146    Radiological Exams on Admission: CT CHEST ABDOMEN PELVIS W CONTRAST Result Date: 04/26/2024 CLINICAL DATA:  Sepsis, cough, generalized weakness, diarrhea, history of lung cancer metastasized to the brain. Unable to keep fluids down EXAM: CT CHEST, ABDOMEN, AND PELVIS WITH CONTRAST TECHNIQUE: Multidetector CT imaging of the chest, abdomen and pelvis was performed following the standard protocol during bolus administration of intravenous contrast. RADIATION DOSE REDUCTION: This exam was performed according to the departmental dose-optimization program which includes automated exposure control, adjustment of the mA and/or kV according to patient size and/or use of iterative reconstruction technique. CONTRAST:  OMNIPAQUE  IOHEXOL  300 MG/ML  SOLN COMPARISON:  Same day chest radiograph and CT 01/27/2024 FINDINGS: CT CHEST FINDINGS Cardiovascular:  Similar large pericardial effusion. Coronary artery and aortic atherosclerotic calcification. Mediastinum/Nodes: Trachea and esophagus are unremarkable. No pathologic adenopathy. Lungs/Pleura: Lingular and bilateral lower lobe consolidation with low-density fluid. Numerous scattered centrilobular ground-glass nodules are likely infectious/inflammatory and  have progressed since 01/27/2024 trace right pleural effusion. No pneumothorax Musculoskeletal: No acute fracture. Chronic T8 compression fracture status post vertebroplasty. CT ABDOMEN PELVIS FINDINGS Hepatobiliary: Cholecystectomy. Unremarkable liver and biliary tree. Pancreas: Unremarkable. Spleen: Unremarkable. Adrenals/Urinary Tract: Normal adrenal glands. No urinary calculi or hydronephrosis. Unremarkable bladder. Stomach/Bowel: Normal caliber large and small bowel. No bowel wall thickening. Normal appendix. Stomach is within normal limits. Vascular/Lymphatic: Advanced aortic atherosclerotic calcification. No pathologic adenopathy. Reproductive: Hysterectomy.  No adnexal mass. Other: No free intraperitoneal fluid or air. Musculoskeletal: No acute fracture. IMPRESSION: 1. Multifocal pneumonia. 2. Similar large pericardial effusion. 3. No acute abnormality in the abdomen or pelvis. 4. Aortic Atherosclerosis (ICD10-I70.0). Electronically Signed   By: Norman Gatlin M.D.   On: 04/26/2024 20:40   DG Chest Port 1 View Result Date: 04/26/2024 CLINICAL DATA:  Questionable sepsis. EXAM: PORTABLE CHEST 1 VIEW COMPARISON:  08/26/2023 , CT 01/27/2019 FINDINGS: Stable large cardiac silhouette. LEFT lobe atelectasis. RIGHT lung clear. No effusion or infiltrate. IMPRESSION: 1. Persistent enlarged cardiac silhouette. Potential pericardial effusion. Pericardial effusion on comparison CT 2. LEFT lobe atelectasis. Electronically Signed   By: Jackquline Boxer M.D.   On: 04/26/2024 17:26    EKG: Independently reviewed.  Sinus rhythm, QTc 505, PVCs.  No STEMI.  Assessment and Plan  Acute hypoxemic respiratory failure secondary to multifocal pneumonia No signs of sepsis at this time.  Oxygen saturation in the high 80s on room air and currently stable on 2 L [place].  COVID/influenza/RSV PCR negative.  Full respiratory viral panel ordered.  Continue antibiotic coverage with ceftriaxone  and doxycycline .  Avoid macrolides due to  QT prolongation on EKG.  Check procalcitonin level and follow-up blood cultures.  Continue supplemental oxygen, wean as tolerated.  UTI UA with evidence of pyuria and bacteriuria.  Patient is endorsing dysuria.  Continue ceftriaxone  and follow-up urine culture.  Mild hypokalemia Mild hypomagnesemia QT prolongation Monitor electrolytes and continue to replace as needed.  Avoid QT prolonging drugs and follow-up repeat EKG in the morning.  Holding home Zyprexa  until EKG is repeated in the morning.  Metastatic lung cancer with brain mets status post radiation therapy in March 2021 and craniotomy with resection of cerebellar mass in March 2022 History of hemorrhagic conversion of brain mets in the setting of anticoagulation use in the past Patient is currently on immunotherapy and missed last treatment on Friday 8/15 due to feeling ill.  She will need outpatient oncology follow-up.  Continue Keppra  and Decadron .  History of pericardial effusion status post pericardiocentesis in April 2025 CT showing stable large pericardial effusion.  No signs of cardiac tamponade.  She is seen by outpatient cardiology and being treated with colchicine  and diuretics.  Continue home meds.  Chronic CHF/NICM Stable, continue home meds.  Last echo done in March 2025 showing EF 50 to 55%, grade 1 diastolic dysfunction, large pericardial effusion, mild aortic regurgitation.  Type 2 diabetes Not on meds.  A1c 7.3 in September 2024, repeat ordered.  Placed on sensitive sliding scale insulin  as she is on chronic steroids.  Chronic pain syndrome Placed on OxyContin  15 mg every 12 hours which is hospital formulary replacement for her home Xtampza .  Continue Oxy IR 5 mg every 4 hours as needed.  Hypertension Currently normotensive.  Continue metoprolol , Lasix , and  spironolactone .  Anxiety Continue Xanax  PRN.  GERD Continue Protonix .  COPD No wheezing.  DuoNeb as needed.  DVT prophylaxis: SCDs Code Status: Full  Code (discussed with the patient) Family Communication: Husband at bedside. Level of care: Progressive Care Unit Admission status: It is my clinical opinion that referral for OBSERVATION is reasonable and necessary in this patient based on the above information provided. The aforementioned taken together are felt to place the patient at high risk for further clinical deterioration. However, it is anticipated that the patient may be medically stable for discharge from the hospital within 24 to 48 hours.  Editha Ram MD [facility]  If 7PM-7AM, please contact night-coverage [email or web address]  04/27/2024, 1:10 AM

## 2024-04-27 NOTE — Assessment & Plan Note (Signed)
-   CT showing stable large pericardial effusion.  No signs of cardiac tamponade.  She is seen by outpatient cardiology and being treated with colchicine  and diuretics.  Continue home meds.

## 2024-04-27 NOTE — Assessment & Plan Note (Signed)
 Placed on OxyContin  15 mg every 12 hours which is hospital formulary replacement for her home Xtampza .  Continue Oxy IR 5 mg every 4 hours as needed.

## 2024-04-27 NOTE — Assessment & Plan Note (Signed)
 Replete as needed

## 2024-04-27 NOTE — ED Notes (Signed)
 ED TO INPATIENT HANDOFF REPORT  Name/Age/Gender Virginia [name] 68 y.o. female  Code Status Code Status History     Date Active Date Inactive Code Status Order ID Comments User Context   12/10/2023 1632 12/12/2023 1513 Full Code [id number]  [name] Inpatient   08/26/2023 0740 08/29/2023 1537 Full Code [id number]  [name], MD ED   08/26/2023 0506 08/26/2023 0740 Full Code [id number]  [name], [name], DO ED   06/10/2023 1734 07/02/2023 1630 Full Code [id number]  [name], [name] Inpatient   06/10/2023 1734 06/10/2023 1734 Full Code [id number]  [name], [name] Inpatient   06/08/2023 0944 06/10/2023 1708 Full Code [id number]  [name], NP Inpatient   06/05/2023 1111 06/08/2023 0944 Limited: Do not attempt resuscitation (DNR) -DNR-LIMITED -Do Not Intubate/DNI  [id number]  [name], NP Inpatient   06/03/2023 1852 06/05/2023 1111 Do not attempt resuscitation (DNR) PRE-ARREST INTERVENTIONS DESIRED [id number]  [name], MD Inpatient   06/03/2023 1620 06/03/2023 1852 Full Code [id number]  [name], NP ED   05/09/2023 1110 05/11/2023 1753 Limited: Do not attempt resuscitation (DNR) -DNR-LIMITED -Do Not Intubate/DNI  [id number]  [name], DO Inpatient   05/04/2023 0359 05/09/2023 1110 Full Code [id number]  [name], MD Inpatient   11/13/2021 2022 11/19/2021 0027 Full Code [id number]  [name], MD ED   12/02/2020 1303 12/04/2020 1717 Full Code [id number]  [name], MD Inpatient   03/15/2020 1501 03/16/2020 1442 Full Code [id number]  [name], MD Inpatient    Questions for Most Recent Historical Code Status (Order [id number])     Question Answer   By: Consent: discussion documented in EHR                Advance Directive Documentation    Flowsheet Row Most Recent Value  Type of Advance Directive Healthcare Power of Attorney, Living will  Pre-existing out of facility DNR order (yellow form or pink MOST form) --  MOST Form in  Place? --    Home/SNF/Other Home  Chief Complaint Multifocal pneumonia [J18.9]  Level of Care/Admitting Diagnosis ED Disposition     ED Disposition  Admit   Condition  --   Comment  Hospital Area: [facility] [[id number]]  Level of Care: Progressive [102]  Admit to Progressive based on following criteria: RESPIRATORY PROBLEMS hypoxemic/hypercapnic respiratory failure that is responsive to NIPPV (BiPAP) or High Flow Nasal Cannula (6-80 lpm). Frequent assessment/intervention, no > Q2 hrs < Q4 hrs, to maintain oxygenation and pulmonary hygiene.  [facility] patient in [facility] at [facility] or [facility] if equivalent level of care is available:: Yes  Covid Evaluation: Asymptomatic - no recent exposure (last 10 days) testing not required  Diagnosis: Multifocal pneumonia [8421132]  Admitting Physician: ALFORNIA MADISON [[id number]]  Attending Physician: ALFORNIA MADISON [[id number]]  For patients discharging to extended facilities (i.e. 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, hip and brain   PONV (postoperative nausea  and vomiting)    Tobacco abuse     Allergies Allergies  Allergen Reactions   Scopolamine  Rash    IV Location/Drains/Wounds Patient Lines/Drains/Airways Status     Active Line/Drains/Airways     Name Placement date Placement time Site Days   Peripheral IV 04/26/24 20 G 1.88 Anterior;Left Forearm 04/26/24  1650  Forearm  1   Peripheral IV 04/26/24 20 G 1 Right Antecubital 04/26/24  1645  Antecubital  1             Labs/Imaging Results for orders placed or performed during the hospital encounter of 04/26/24 (from the past 48 hours)  CBC with Differential     Status: Abnormal   Collection Time: 04/26/24  4:33 PM  Result Value Ref Range   WBC 9.1 4.0 - 10.5 K/uL   RBC 3.98 3.87 - 5.11 MIL/uL   Hemoglobin 12.8 12.0 - 15.0 g/dL   HCT 61.5 63.9 - 53.9 %   MCV 96.5 80.0 - 100.0 fL   MCH 32.2 26.0 - 34.0 pg   MCHC 33.3 30.0 - 36.0 g/dL   RDW 87.5 88.4 - 84.4 %   Platelets 214 150 - 400 K/uL   nRBC 0.0 0.0 - 0.2 %   Neutrophils Relative % 57 %   Neutro Abs 5.3 1.7 - 7.7 K/uL   Lymphocytes Relative 24 %   Lymphs Abs 2.2 0.7 - 4.0 K/uL   Monocytes Relative 17 %   Monocytes Absolute 1.6 (H) 0.1 - 1.0 K/uL   Eosinophils Relative 1 %   Eosinophils Absolute 0.1 0.0 - 0.5 K/uL   Basophils Relative 0 %   Basophils Absolute 0.0 0.0 - 0.1 K/uL   Immature Granulocytes 1 %   Abs Immature Granulocytes 0.06 0.00 - 0.07 K/uL    Comment: Performed at [facility], [address], [place], [place] [address]  Blood culture (routine x 2)     Status: None (Preliminary result)   Collection Time: 04/26/24  4:53 PM   Specimen: BLOOD LEFT FOREARM  Result Value Ref Range   Specimen Description      BLOOD LEFT FOREARM Performed at [facility], [address] [place], [place] [address]    Special Requests      BOTTLES DRAWN AEROBIC AND ANAEROBIC Blood Culture adequate volume Performed at [facility], [address] [place], [place] [address]    Culture PENDING    Report Status PENDING   Resp panel by RT-PCR (RSV, Flu A&B, Covid) Anterior Nasal Swab     Status: None   Collection Time: 04/26/24  4:53 PM   Specimen: Anterior Nasal Swab  Result Value Ref Range   SARS Coronavirus 2 by RT PCR NEGATIVE NEGATIVE    Comment: (NOTE) SARS-CoV-2 target nucleic acids are NOT DETECTED.  The SARS-CoV-2 RNA is generally detectable in upper respiratory specimens during the  acute phase of infection. The lowest concentration of SARS-CoV-2 viral copies this assay can detect is 138 copies/mL. A negative result does not preclude SARS-Cov-2 infection and should not be used as the sole basis for treatment or other patient management decisions. A negative result may occur with  improper specimen collection/handling, submission of specimen other than nasopharyngeal swab, presence of viral mutation(s) within the areas targeted by this assay, and inadequate number of viral copies(<138 copies/mL). A negative result must be combined with clinical observations, patient history, and epidemiological information. The expected result is Negative.  Fact Sheet for Patients:  BloggerCourse.com  Fact Sheet for Healthcare Providers:  SeriousBroker.it  This test is no t yet approved or cleared by the [place]  FDA and  has been authorized for detection and/or diagnosis of SARS-CoV-2 by FDA under an Emergency Use Authorization (EUA). This EUA will remain  in effect (meaning this test can be used) for the duration of the COVID-19 declaration under Section 564(b)(1) of the Act, 21 U.S.C.section 360bbb-3(b)(1), unless the authorization is terminated  or revoked sooner.       Influenza A by PCR NEGATIVE NEGATIVE   Influenza B by PCR NEGATIVE NEGATIVE    Comment: (NOTE) The Xpert Xpress SARS-CoV-2/FLU/RSV plus assay is intended as an aid in the diagnosis of influenza from Nasopharyngeal swab specimens and should not be used as a sole basis for treatment. Nasal washings and aspirates are unacceptable for Xpert Xpress SARS-CoV-2/FLU/RSV testing.  Fact Sheet for Patients: BloggerCourse.com  Fact Sheet for Healthcare Providers: SeriousBroker.it  This test is not yet approved or cleared by the [place]  FDA and has been authorized for detection and/or diagnosis of  SARS-CoV-2 by FDA under an Emergency Use Authorization (EUA). This EUA will remain in effect (meaning this test can be used) for the duration of the COVID-19 declaration under Section 564(b)(1) of the Act, 21 U.S.C. section 360bbb-3(b)(1), unless the authorization is terminated or revoked.     Resp Syncytial Virus by PCR NEGATIVE NEGATIVE    Comment: (NOTE) Fact Sheet for Patients: BloggerCourse.com  Fact Sheet for Healthcare Providers: SeriousBroker.it  This test is not yet approved or cleared by the [place]  FDA and has been authorized for detection and/or diagnosis of SARS-CoV-2 by FDA under an Emergency Use Authorization (EUA). This EUA will remain in effect (meaning this test can be used) for the duration of the COVID-19 declaration under Section 564(b)(1) of the Act, 21 U.S.C. section 360bbb-3(b)(1), unless the authorization is terminated or revoked.  Performed at [facility], [address], [place], [place] [address]   Protime-INR     Status: None   Collection Time: 04/26/24  4:53 PM  Result Value Ref Range   Prothrombin Time 14.5 11.4 - 15.2 seconds   INR 1.1 0.8 - 1.2    Comment: (NOTE) INR goal varies based on device and disease states. Performed at [facility], [address], [place], [place] [address]   I-Stat Lactic Acid, ED     Status: None   Collection Time: 04/26/24  4:59 PM  Result Value Ref Range   Lactic Acid, Venous 1.1 0.5 - 1.9 mmol/L  Comprehensive metabolic panel with GFR     Status: Abnormal   Collection Time: 04/26/24  7:03 PM  Result Value Ref Range   Sodium 142 135 - 145 mmol/L   Potassium 3.0 (L) 3.5 - 5.1 mmol/L   Chloride 108 98 - 111 mmol/L   CO2 20 (L) 22 - 32 mmol/L   Glucose, Bld 97 70 - 99 mg/dL    Comment: Glucose reference range applies only to samples taken after fasting for at least 8 hours.   BUN 19 8 - 23 mg/dL   Creatinine, Ser  9.13 0.44 - 1.00 mg/dL   Calcium  7.3 (L) 8.9 - 10.3 mg/dL   Total Protein 5.0 (L) 6.5 - 8.1 g/dL   Albumin 2.2 (L) 3.5 - 5.0 g/dL   AST 14 (L) 15 - 41 U/L   ALT 13 0 - 44 U/L   Alkaline Phosphatase 39 38 - 126 U/L   Total  Bilirubin 0.9 0.0 - 1.2 mg/dL   GFR, Estimated >39 >39 mL/min    Comment: (NOTE) Calculated using the CKD-EPI Creatinine Equation (2021)    Anion gap 14 5 - 15    Comment: Performed at [facility], [address], [place], [place] [address]  Magnesium      Status: Abnormal   Collection Time: 04/26/24  7:03 PM  Result Value Ref Range   Magnesium  1.6 (L) 1.7 - 2.4 mg/dL    Comment: Performed at [facility], [address] [place], [place] [address]  Urinalysis, w/ Reflex to Culture (Infection Suspected) -Urine, Clean Catch     Status: Abnormal   Collection Time: 04/26/24  9:46 PM  Result Value Ref Range   Specimen Source URINE, CLEAN CATCH     Comment: Performed at [facility], [address] [place], [place] [address]   Color, Urine YELLOW YELLOW   APPearance HAZY (A) CLEAR   Specific Gravity, Urine 1.023 1.005 - 1.030   pH 5.0 5.0 - 8.0   Glucose, UA NEGATIVE NEGATIVE mg/dL   Hgb urine dipstick MODERATE (A) NEGATIVE   Bilirubin Urine NEGATIVE NEGATIVE   Ketones, ur 20 (A) NEGATIVE mg/dL   Protein, ur NEGATIVE NEGATIVE mg/dL   Nitrite NEGATIVE NEGATIVE   Leukocytes,Ua LARGE (A) NEGATIVE   RBC / HPF 6-10 0 - 5 RBC/hpf   WBC, UA >50 0 - 5 WBC/hpf    Comment:        Reflex urine culture not performed if WBC <=10, OR if Squamous epithelial cells >5. If Squamous epithelial cells >5 suggest recollection.    Bacteria, UA FEW (A) NONE SEEN   Squamous Epithelial / HPF 0-5 0 - 5 /HPF   Non Squamous Epithelial 0-5 (A) NONE SEEN    Comment: Performed at [facility], [address], [place], [place] [address]   *Note: Due to a large number of results and/or encounters for the requested time period,  some results have not been displayed. A complete set of results can be found in Results Review.   CT CHEST ABDOMEN PELVIS W CONTRAST Result Date: 04/26/2024 CLINICAL DATA:  Sepsis, cough, generalized weakness, diarrhea, history of lung cancer metastasized to the brain. Unable to keep fluids down EXAM: CT CHEST, ABDOMEN, AND PELVIS WITH CONTRAST TECHNIQUE: Multidetector CT imaging of the chest, abdomen and pelvis was performed following the standard protocol during bolus administration of intravenous contrast. RADIATION DOSE REDUCTION: This exam was performed according to the departmental dose-optimization program which includes automated exposure control, adjustment of the mA and/or kV according to patient size and/or use of iterative reconstruction technique. CONTRAST:  OMNIPAQUE  IOHEXOL  300 MG/ML  SOLN COMPARISON:  Same day chest radiograph and CT 01/27/2024 FINDINGS: CT CHEST FINDINGS Cardiovascular: Similar large pericardial effusion. Coronary artery and aortic atherosclerotic calcification. Mediastinum/Nodes: Trachea and esophagus are unremarkable. No pathologic adenopathy. Lungs/Pleura: Lingular and bilateral lower lobe consolidation with low-density fluid. Numerous scattered centrilobular ground-glass nodules are likely infectious/inflammatory and have progressed since 01/27/2024 trace right pleural effusion. No pneumothorax Musculoskeletal: No acute fracture. Chronic T8 compression fracture status post vertebroplasty. CT ABDOMEN PELVIS FINDINGS Hepatobiliary: Cholecystectomy. Unremarkable liver and biliary tree. Pancreas: Unremarkable. Spleen: Unremarkable. Adrenals/Urinary Tract: Normal adrenal glands. No urinary calculi or hydronephrosis. Unremarkable bladder. Stomach/Bowel: Normal caliber large and small bowel. No bowel wall thickening. Normal appendix. Stomach is within normal limits. Vascular/Lymphatic: Advanced aortic atherosclerotic calcification. No pathologic adenopathy. Reproductive:  Hysterectomy.  No adnexal mass. Other: No  free intraperitoneal fluid or air. Musculoskeletal: No acute fracture. IMPRESSION: 1. Multifocal pneumonia. 2. Similar large pericardial effusion. 3. No acute abnormality in the abdomen or pelvis. 4. Aortic Atherosclerosis (ICD10-I70.0). Electronically Signed   By: Norman Gatlin M.D.   On: 04/26/2024 20:40   DG Chest Port 1 View Result Date: 04/26/2024 CLINICAL DATA:  Questionable sepsis. EXAM: PORTABLE CHEST 1 VIEW COMPARISON:  08/26/2023 , CT 01/27/2019 FINDINGS: Stable large cardiac silhouette. LEFT lobe atelectasis. RIGHT lung clear. No effusion or infiltrate. IMPRESSION: 1. Persistent enlarged cardiac silhouette. Potential pericardial effusion. Pericardial effusion on comparison CT 2. LEFT lobe atelectasis. Electronically Signed   By: Jackquline Boxer M.D.   On: 04/26/2024 17:26    Pending Labs Unresulted Labs (From admission, onward)     Start     Ordered   04/26/24 2146  Urine Culture  Once,   R        04/26/24 2146   04/26/24 1637  Blood culture (routine x 2)  BLOOD CULTURE X 2,   R (with STAT occurrences)      04/26/24 1636            Vitals/Pain Today's Vitals   04/26/24 2135 04/27/24 0000 04/27/24 0107 04/27/24 0127  BP:  118/82    Pulse:  94    Resp:  14    Temp:    97.8 F (36.6 C)  TempSrc:    Oral  SpO2:  94%    Weight:      Height:      PainSc: 7   7      Isolation Precautions No active isolations  Medications Medications  lactated ringers  infusion ( Intravenous Infusion Verify 04/27/24 0137)  lactated ringers  bolus 1,000 mL (0 mLs Intravenous Stopped 04/26/24 1759)  cefTRIAXone  (ROCEPHIN ) 2 g in sodium chloride  0.9 % 100 mL IVPB (0 g Intravenous Stopped 04/26/24 1740)  azithromycin  (ZITHROMAX ) 500 mg in sodium chloride  0.9 % 250 mL IVPB (0 mg Intravenous Stopped 04/26/24 1759)  methylPREDNISolone  sodium succinate (SOLU-MEDROL ) 125 mg/2 mL injection 125 mg (125 mg Intravenous Given 04/26/24 1657)   ipratropium-albuterol  (DUONEB) 0.5-2.5 (3) MG/3ML nebulizer solution 3 mL (3 mLs Nebulization Given 04/26/24 1657)  iohexol  (OMNIPAQUE ) 300 MG/ML solution 100 mL (100 mLs Intravenous Contrast Given 04/26/24 2015)  morphine  (PF) 4 MG/ML injection 4 mg (4 mg Intravenous Given 04/26/24 2135)  ondansetron  (ZOFRAN ) injection 4 mg (4 mg Intravenous Given 04/26/24 2135)  levETIRAcetam  (KEPPRA ) tablet 1,000 mg (1,000 mg Oral Given 04/26/24 2134)  potassium chloride  SA (KLOR-CON  M) CR tablet 40 mEq (40 mEq Oral Given 04/26/24 2135)  magnesium  sulfate IVPB 2 g 50 mL (0 g Intravenous Stopped 04/26/24 2314)  calcium  gluconate 1 g/ 50 mL sodium chloride  IVPB (0 mg Intravenous Stopped 04/26/24 2314)  acetaminophen  (TYLENOL ) tablet 650 mg (650 mg Oral Given 04/27/24 0107)    Mobility non-ambulatory

## 2024-04-27 NOTE — Hospital Course (Signed)
 Virginia [name] is a 68 y.o. 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   Husband also states she has essentially left-sided hemiparesis ever since craniotomy.  She has been bedbound as of recently as well.  Patient is presenting with complaints of cough, runny nose, sore throat, shortness of breath, and generalized weakness for the past few days.  She did also endorse some urinary complaints with dysuria on admission. She had a video visit with her PCP on 8/15 and was prescribed azithromycin  which she has been taking without much improvement.  She denies fevers or chest pain.  Husband states patient has been having loose stools/diarrhea but she does take scheduled laxatives at home as she is on opioids for chronic pain.  Patient missed her immunotherapy treatment on Friday due to feeling sick.  Patient denies nausea, vomiting, or abdominal pain.  She is endorsing dysuria as noted.  CT chest/abdomen/pelvis was performed on admission.  Multifocal pneumonia appreciated with stable large pericardial effusion noted.  No acute abnormalities in the abdomen/pelvis. She was started on Rocephin  and doxycycline  and admitted for further workup.

## 2024-04-27 NOTE — Assessment & Plan Note (Signed)
-   Continue Xanax PRN

## 2024-04-27 NOTE — Assessment & Plan Note (Signed)
 Stable, continue home meds.  Last echo done in March 2025 showing EF 50 to 55%, grade 1 diastolic dysfunction, large pericardial effusion, mild aortic regurgitation.

## 2024-04-27 NOTE — Assessment & Plan Note (Signed)
-   Patient does endorse some urinary symptoms of dysuria on admission - UA consistent with infection; negative nitrite, large LE, greater than 50 WBC - Continue Rocephin  - No growth on urine culture.  Empiric treatment will be covered with treatment of pneumonia regardless

## 2024-04-27 NOTE — Assessment & Plan Note (Signed)
 No S/S exacerbation - Continue DuoNebs as needed

## 2024-04-27 NOTE — Assessment & Plan Note (Signed)
-   Not on oxygen at home - CT chest shows multifocal pneumonia with stable large pericardial effusion and trace right pleural effusion - Continuing treatment for pneumonia as above - Continue oxygen and wean as able

## 2024-04-27 NOTE — Assessment & Plan Note (Signed)
-   Continue Protonix

## 2024-04-27 NOTE — Evaluation (Signed)
 Physical Therapy Evaluation Patient Details Name: Virginia [name] MRN: [id number] DOB: 10/27/1955 Today's Date: 04/27/2024  History of Present Illness  68 yo female presents to therapy following hospital admission secondary to cough, runny nose, sore throat and SOB with general weakness over the past few days. Pt also has been having loose stools and missed immunotherapy. Pt found to be hypoxic requiring 2 L/min and found to have PNA. 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  Clinical Impression    Pt admitted with above diagnosis.  Pt currently with functional limitations due to the deficits listed below (see PT Problem List). Pt in bed when PT returned later in the day, pt awake and willing to participate with therapy. Spouse present and nursing staff. Pt indicated LBP. L UE and B LE edema noted. Pt required max A for supine <> sit, static sitting EOB with B LE support and R UE support with CGA, LOB with perturbation, pt unable to demonstrate ability to scoot laterally to R side toward HOB. Pt left in bed  all needs in place, nursing and spouse present.  Patient will benefit from continued inpatient follow up therapy, <3 hours/day. O2 saturation on RA 97% in supine and 91-94% with mobility tasks and no reports of SOB. Pt will benefit from acute skilled PT to increase their independence and safety with mobility to allow discharge.         If plan is discharge home, recommend the following: Two people to help with walking and/or transfers;Two people to help with bathing/dressing/bathroom;Assistance with cooking/housework;Help with stairs or ramp for entrance   Can travel by private vehicle   No    Equipment Recommendations None recommended by PT   Recommendations for Other Services       Functional Status Assessment Patient has had a recent decline in their functional status and demonstrates the ability to make significant improvements in function in a reasonable and predictable amount of time.     Precautions / Restrictions Precautions Precautions: Fall (L UE tremors) Restrictions Weight Bearing Restrictions Per Provider Order: No      Mobility  Bed Mobility Overal bed mobility: Needs Assistance Bed Mobility: Supine to Sit, Sit to Supine     Supine to sit: Max assist, HOB elevated Sit to supine: Max assist   General bed mobility comments: pt is able to initiate LEs toward EOB, extensive assist with pt pulling on therapist arm for supine to sit with HOB elevated, return to bed max A for B LE and trunk and total A to reposition in bed with pt in slight R side lying with use of pillow and L UE elevated on pillow secondary to edema    Transfers                   General transfer comment: NT    Ambulation/Gait               General Gait Details: NT  Stairs            Wheelchair Mobility     Tilt Bed    Modified Rankin (Stroke Patients Only)       Balance Overall balance assessment: Needs assistance, History of Falls (recent fall with transfer in home setting) Sitting-balance support: Feet supported  Sitting balance-Leahy Scale: Fair Sitting balance - Comments: pt is unable to accept external perturbation when in sitting resulting in posterior LOB and pt unable to self correct, scooting toward R to [facility] with total A and pt unable to maintain upright midline sitting Postural control: Posterior lean                                   Pertinent Vitals/Pain Pain Assessment Pain Assessment: Faces Faces Pain Scale: Hurts even more Pain Location: back Pain Descriptors / Indicators: Aching, Constant, Discomfort, Dull Pain Intervention(s): Limited activity within patient's  tolerance, Monitored during session, Premedicated before session, Repositioned, Heat applied (nursing has HP on pt back in bed)    Home Living Family/patient expects to be discharged to:: Private residence Living Arrangements: Spouse/significant other Available Help at Discharge: Family;Available 24 hours/day Type of Home: House Home Access: Stairs to enter Entrance Stairs-Rails: None Entrance Stairs-Number of Steps: 4 Alternate Level Stairs-Number of Steps: 15 Home Layout: Two level;Bed/bath upstairs;1/2 bath on main level;Able to live on main level with bedroom/bathroom Home Equipment: Rollator (4 wheels);Wheelchair - Biomedical scientist (2 wheels);BSC/3in1;Hospital bed Additional Comments: spouse reports dining room now bedroom, pt required min to mod A for transfer tasks bed <> wc, wc <> commode up until 2 wks ago and since that time pt has required extensive assist bear hug for transfers    Prior Function Prior Level of Function : Needs assist       Physical Assist : Mobility (physical);ADLs (physical) Mobility (physical): Gait;Stairs;Transfers;Bed mobility ADLs (physical): IADLs;Toileting;Dressing;Bathing Mobility Comments: max A for transfer tasks, participating some with HH PT prior to hospitalization and able to walk short distances up until 2 wks ago ADLs Comments: A for all ADLs, self care tasks     Extremity/Trunk Assessment        Lower Extremity Assessment Lower Extremity Assessment: Generalized weakness (B LE edema)    Cervical / Trunk Assessment Cervical / Trunk Assessment: Normal  Communication   Communication Communication: No apparent difficulties    Cognition Arousal: Alert Behavior During Therapy: WFL for tasks assessed/performed   PT - Cognitive impairments: No apparent impairments                         Following commands: Intact       Cueing       General Comments General comments (skin integrity, edema, etc.):  B LE and L UE edema    Exercises     Assessment/Plan    PT Assessment Patient needs continued PT services  PT Problem List Decreased strength;Decreased activity tolerance;Decreased mobility;Decreased balance;Decreased coordination;Pain       PT Treatment Interventions DME instruction;Gait training;Functional mobility training;Therapeutic activities;Therapeutic exercise;Balance training;Neuromuscular re-education;Patient/family education    PT Goals (Current goals can be found in the Care Plan section)  Acute Rehab PT Goals Patient Stated Goal: to be able to get stronger and go home PT Goal Formulation: With patient Time For Goal Achievement: 05/11/24 Potential to Achieve Goals: Fair    Frequency Min 2X/week     Co-evaluation               AM-PAC PT 6 Clicks Mobility  Outcome Measure Help needed turning from your back to your side while in a flat bed without using bedrails?: A Lot Help needed moving from lying on your back to sitting on the side of a flat bed  without using bedrails?: A Lot Help needed moving to and from a bed to a chair (including a wheelchair)?: A Lot Help needed standing up from a chair using your arms (e.g., wheelchair or bedside chair)?: Total Help needed to walk in hospital room?: Total Help needed climbing 3-5 steps with a railing? : Total 6 Click Score: 9    End of Session Equipment Utilized During Treatment: Oxygen Activity Tolerance: Patient limited by fatigue Patient left: in bed;with call bell/phone within reach;with family/visitor present;with nursing/sitter in room Nurse Communication: Mobility status;Need for lift equipment PT Visit Diagnosis: Unsteadiness on feet (R26.81);Other abnormalities of gait and mobility (R26.89);Muscle weakness (generalized) (M62.81);History of falling (Z91.81);Difficulty in walking, not elsewhere classified (R26.2);Pain Pain - Right/Left:  (back)    Time: 8379-8354 PT Time Calculation (min) (ACUTE ONLY):  25 min   Charges:   PT Evaluation $PT Eval Low Complexity: 1 Low PT Treatments $Therapeutic Activity: 8-22 mins PT General Charges $$ ACUTE PT VISIT: 1 Visit         [place], PT Acute Rehab   [name] 04/27/2024, 6:13 PM

## 2024-04-27 NOTE — Progress Notes (Signed)
 Progress Note    [facility]   FMW:[id number]  DOB: 1955-11-16  DOA: 04/26/2024     0 PCP: Perri Ronal PARAS, MD  Initial CC: Cough  Hospital Course: Virginia [name] is a 68 y.o. 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   Husband also states she has essentially left-sided hemiparesis ever since craniotomy.  She has been bedbound as of recently as well.  Patient is presenting with complaints of cough, runny nose, sore throat, shortness of breath, and generalized weakness for the past few days.  She did also endorse some urinary complaints with dysuria on admission. She had a video visit with her PCP on 8/15 and was prescribed azithromycin  which she has been taking without much improvement.  She denies fevers or chest pain.  Husband states patient has been having loose stools/diarrhea but she does take scheduled laxatives at home as she is on opioids for chronic pain.  Patient missed her immunotherapy treatment on Friday due to feeling sick.  Patient denies nausea, vomiting, or abdominal pain.  She is endorsing dysuria as noted.  CT chest/abdomen/pelvis was performed on admission.  Multifocal pneumonia appreciated with stable large pericardial effusion noted.  No acute abnormalities in the abdomen/pelvis. She was started on Rocephin  and doxycycline  and admitted for further workup.  Interval History:  Sleeping  when seen this morning but awakens easily.  Husband present at bedside.  He provides similar information from admission mostly talking about her respiratory symptoms and patient is able to tell me about her urinary complaints as well.  She is not on oxygen at home.  Continuing to treat for pneumonia and UTI at this time.  Assessment and Plan: * Multifocal pneumonia - Scattered centrilobular ground glass nodules considered infectious/inflammatory showing progression since May 2025 and bilateral lower lobe consolidation noted concerning for multifocal pneumonia -Some tachycardia on admission but very mild.  Afebrile, no leukocytosis - Procalcitonin negative - Follow-up RVP.  COVID, flu, RSV negative - Continue on Rocephin  and doxycycline   Acute hypoxemic respiratory failure (HCC) - Not on oxygen at home - CT chest shows multifocal pneumonia with stable large pericardial effusion and trace right pleural effusion - Continuing treatment for pneumonia as above - Continue oxygen and wean as able  UTI (urinary tract infection) - Patient does endorse some urinary symptoms of dysuria on admission - UA consistent with infection; negative nitrite, large LE, greater than 50 WBC - Continue Rocephin  - Follow-up urine culture  Pericardial effusion without cardiac tamponade - CT showing stable large pericardial effusion.  No signs of cardiac tamponade.  She is seen by outpatient cardiology and being treated with colchicine  and diuretics.  Continue home meds.   Hypokalemia - Replete as needed  Heart failure with reduced ejection fraction (HCC) Stable, continue home meds.  Last echo done in March 2025 showing EF 50 to 55%, grade 1 diastolic dysfunction, large pericardial effusion, mild aortic regurgitation.   Chronic pain Placed on OxyContin  15 mg every 12 hours which is hospital formulary replacement for her  home Xtampza .  Continue Oxy IR 5 mg every 4 hours as needed.   Type 2 diabetes mellitus with  complication, without long-term current use of insulin  (HCC) Not on meds. A1c 7.3 in September 2024, repeat ordered. Placed on sensitive sliding scale insulin  as she is on chronic steroids.   GERD (gastroesophageal reflux disease) - Continue Protonix   Hypomagnesemia - Replete as needed  COPD (chronic obstructive pulmonary disease) (HCC) No S/S exacerbation - Continue DuoNebs as needed  Hypertension - Continue Lasix   Anxiety Continue Xanax  PRN.     Old records reviewed in assessment of this patient  Antimicrobials: Rocephin  04/26/2024 >> current Doxycycline  04/27/2024 >> current  DVT prophylaxis:  SCDs Start: 04/27/24 0220   Code Status:   Code Status: Full Code  Mobility Assessment (Last 72 Hours)     Mobility Assessment     Row Name 04/27/24 0301           Does the patient have exclusion criteria? No - Perform mobility assessment       What is the highest level of mobility based on the mobility assessment? Level 2 (Chairfast) - Balance while sitting on edge of bed and cannot stand       Is the above level different from baseline mobility prior to current illness? Yes - Recommend PT order          Barriers to discharge: None Disposition Plan: Home HH orders placed: TBD Status is: Inpatient  Objective: Blood pressure 116/67, pulse 60, temperature 98 F (36.7 C), temperature source Axillary, resp. rate 17, height 5' 2 (1.575 m), weight 85.2 kg, SpO2 97%.  Examination:  Physical Exam Constitutional:      General: She is not in acute distress.    Comments: Drowsy and sleepy this morning  HENT:     Head: Normocephalic and atraumatic.     Mouth/Throat:     Mouth: Mucous membranes are moist.  Eyes:     Extraocular Movements: Extraocular movements intact.  Cardiovascular:     Rate and Rhythm: Normal rate and regular rhythm.  Pulmonary:     Effort: Pulmonary effort is normal. No respiratory distress.     Breath sounds: Rhonchi (scattered) present. No  wheezing.  Abdominal:     General: Bowel sounds are normal. There is no distension.     Palpations: Abdomen is soft.     Tenderness: There is no abdominal tenderness.  Musculoskeletal:        General: Normal range of motion.     Cervical back: Normal range of motion and neck supple.  Skin:    General: Skin is warm and dry.  Neurological:     Mental Status: She is alert.     Comments: Generalized weakness, worsened strength on left  Psychiatric:        Mood and Affect: Mood normal.      Consultants:    Procedures:    Data Reviewed: Results for orders placed or performed during the hospital encounter of 04/26/24 (from the past 24 hours)  CBC with Differential     Status: Abnormal   Collection Time: 04/26/24  4:33 PM  Result Value Ref Range   WBC 9.1 4.0 - 10.5 K/uL   RBC 3.98 3.87 - 5.11 MIL/uL   Hemoglobin 12.8 12.0 - 15.0 g/dL   HCT 61.5 63.9 - 53.9 %   MCV 96.5 80.0 - 100.0 fL   MCH 32.2 26.0 - 34.0 pg   MCHC 33.3 30.0 - 36.0 g/dL  RDW 12.4 11.5 - 15.5 %   Platelets 214 150 - 400 K/uL   nRBC 0.0 0.0 - 0.2 %   Neutrophils Relative % 57 %   Neutro Abs 5.3 1.7 - 7.7 K/uL   Lymphocytes Relative 24 %   Lymphs Abs 2.2 0.7 - 4.0 K/uL   Monocytes Relative 17 %   Monocytes Absolute 1.6 (H) 0.1 - 1.0 K/uL   Eosinophils Relative 1 %   Eosinophils Absolute 0.1 0.0 - 0.5 K/uL   Basophils Relative 0 %   Basophils Absolute 0.0 0.0 - 0.1 K/uL   Immature Granulocytes 1 %   Abs Immature Granulocytes 0.06 0.00 - 0.07 K/uL  Blood culture (routine x 2)     Status: None (Preliminary result)   Collection Time: 04/26/24  4:53 PM   Specimen: BLOOD LEFT FOREARM  Result Value Ref Range   Specimen Description      BLOOD LEFT FOREARM Performed at [facility], [address], [place], [place] [address]    Special Requests      BOTTLES DRAWN AEROBIC AND ANAEROBIC Blood Culture adequate volume Performed at [facility], [address] [place], [place]  [address]    Culture      NO GROWTH < 24 HOURS Performed at [facility], [address] [place], [place] [address]    Report Status PENDING   Resp panel by RT-PCR (RSV, Flu A&B, Covid) Anterior Nasal Swab     Status: None   Collection Time: 04/26/24  4:53 PM   Specimen: Anterior Nasal Swab  Result Value Ref Range   SARS Coronavirus 2 by RT PCR NEGATIVE NEGATIVE   Influenza A by PCR NEGATIVE NEGATIVE   Influenza B by PCR NEGATIVE NEGATIVE   Resp Syncytial Virus by PCR NEGATIVE NEGATIVE  Protime-INR     Status: None   Collection Time: 04/26/24  4:53 PM  Result Value Ref Range   Prothrombin Time 14.5 11.4 - 15.2 seconds   INR 1.1 0.8 - 1.2  I-Stat Lactic Acid, ED     Status: None   Collection Time: 04/26/24  4:59 PM  Result Value Ref Range   Lactic Acid, Venous 1.1 0.5 - 1.9 mmol/L  Comprehensive metabolic panel with GFR     Status: Abnormal   Collection Time: 04/26/24  7:03 PM  Result Value Ref Range   Sodium 142 135 - 145 mmol/L   Potassium 3.0 (L) 3.5 - 5.1 mmol/L   Chloride 108 98 - 111 mmol/L   CO2 20 (L) 22 - 32 mmol/L   Glucose, Bld 97 70 - 99 mg/dL   BUN 19 8 - 23 mg/dL   Creatinine, Ser 9.13 0.44 - 1.00 mg/dL   Calcium  7.3 (L) 8.9 - 10.3 mg/dL   Total Protein 5.0 (L) 6.5 - 8.1 g/dL   Albumin 2.2 (L) 3.5 - 5.0 g/dL   AST 14 (L) 15 - 41 U/L   ALT 13 0 - 44 U/L   Alkaline Phosphatase 39 38 - 126 U/L   Total Bilirubin 0.9 0.0 - 1.2 mg/dL   GFR, Estimated >39 >39 mL/min   Anion gap 14 5 - 15  Magnesium      Status: Abnormal   Collection Time: 04/26/24  7:03 PM  Result Value Ref Range   Magnesium  1.6 (L) 1.7 - 2.4 mg/dL  Blood culture (routine x 2)     Status: None (Preliminary result)   Collection Time: 04/26/24  7:10 PM  Specimen: BLOOD  Result Value Ref Range   Specimen Description      BLOOD BLOOD LEFT HAND Performed at [facility], [address] [place], [place] [address]    Special Requests      BOTTLES DRAWN AEROBIC ONLY Blood  Culture results may not be optimal due to an inadequate volume of blood received in culture bottles Performed at [facility], [address], [place], [place] [address]    Culture      NO GROWTH < 12 HOURS Performed at [facility], [address], [place], [place] [address]    Report Status PENDING   Urinalysis, w/ Reflex to Culture (Infection Suspected) -Urine, Clean Catch     Status: Abnormal   Collection Time: 04/26/24  9:46 PM  Result Value Ref Range   Specimen Source URINE, CLEAN CATCH    Color, Urine YELLOW YELLOW   APPearance HAZY (A) CLEAR   Specific Gravity, Urine 1.023 1.005 - 1.030   pH 5.0 5.0 - 8.0   Glucose, UA NEGATIVE NEGATIVE mg/dL   Hgb urine dipstick MODERATE (A) NEGATIVE   Bilirubin Urine NEGATIVE NEGATIVE   Ketones, ur 20 (A) NEGATIVE mg/dL   Protein, ur NEGATIVE NEGATIVE mg/dL   Nitrite NEGATIVE NEGATIVE   Leukocytes,Ua LARGE (A) NEGATIVE   RBC / HPF 6-10 0 - 5 RBC/hpf   WBC, UA >50 0 - 5 WBC/hpf   Bacteria, UA FEW (A) NONE SEEN   Squamous Epithelial / HPF 0-5 0 - 5 /HPF   Non Squamous Epithelial 0-5 (A) NONE SEEN  Procalcitonin     Status: None   Collection Time: 04/27/24  3:49 AM  Result Value Ref Range   Procalcitonin <0.10 ng/mL  Basic metabolic panel     Status: Abnormal   Collection Time: 04/27/24  3:49 AM  Result Value Ref Range   Sodium 139 135 - 145 mmol/L   Potassium 4.2 3.5 - 5.1 mmol/L   Chloride 105 98 - 111 mmol/L   CO2 21 (L) 22 - 32 mmol/L   Glucose, Bld 149 (H) 70 - 99 mg/dL   BUN 21 8 - 23 mg/dL   Creatinine, Ser 9.01 0.44 - 1.00 mg/dL   Calcium  8.6 (L) 8.9 - 10.3 mg/dL   GFR, Estimated >39 >39 mL/min   Anion gap 13 5 - 15  Magnesium      Status: None   Collection Time: 04/27/24  3:49 AM  Result Value Ref Range   Magnesium  2.1 1.7 - 2.4 mg/dL  Glucose, capillary     Status: Abnormal   Collection Time: 04/27/24  7:32 AM  Result Value Ref Range   Glucose-Capillary 135 (H) 70 - 99 mg/dL  Glucose,  capillary     Status: Abnormal   Collection Time: 04/27/24 12:43 PM  Result Value Ref Range   Glucose-Capillary 107 (H) 70 - 99 mg/dL   *Note: Due to a large number of results and/or encounters for the requested time period, some results have not been displayed. A complete set of results can be found in Results Review.    I have reviewed pertinent nursing notes, vitals, labs, and images as necessary. I have ordered labwork to follow up on as indicated.  I have reviewed the last notes from staff over past 24 hours. I have discussed patient's care plan and test results with nursing staff, CM/SW, and other staff as appropriate.  Time spent: Greater than 50% of the 55 minute visit was spent  in counseling/coordination of care for the patient as laid out in the A&P.   LOS: 0 days   Alm Apo, MD [facility] 04/27/2024, 1:34 PM

## 2024-04-27 NOTE — Assessment & Plan Note (Signed)
 Continue Lasix

## 2024-04-27 NOTE — Assessment & Plan Note (Signed)
 Not on meds. A1c 7.3 in September 2024, repeat ordered. Placed on sensitive sliding scale insulin  as she is on chronic steroids.

## 2024-04-27 NOTE — Progress Notes (Signed)
 PT Cancellation Note  Patient Details Name: Virginia [name] MRN: [id number] DOB: 1956/07/03   Cancelled Treatment:    Reason Eval/Treat Not Completed: Fatigue/lethargy limiting ability to participate. Pt sleeping when PT arrived 1208. PT to return as schedule allows. PT to continue to follow acutely.   [place], PT Acute Rehab   [name] 04/27/2024, 12:13 PM

## 2024-04-28 DIAGNOSIS — J9601 Acute respiratory failure with hypoxia: Secondary | ICD-10-CM | POA: Diagnosis not present

## 2024-04-28 DIAGNOSIS — B348 Other viral infections of unspecified site: Secondary | ICD-10-CM | POA: Diagnosis not present

## 2024-04-28 DIAGNOSIS — J189 Pneumonia, unspecified organism: Secondary | ICD-10-CM | POA: Diagnosis not present

## 2024-04-28 DIAGNOSIS — R5381 Other malaise: Secondary | ICD-10-CM | POA: Diagnosis not present

## 2024-04-28 LAB — HEMOGLOBIN A1C
Hgb A1c MFr Bld: 5.8 % — ABNORMAL HIGH (ref 4.8–5.6)
Mean Plasma Glucose: 120 mg/dL

## 2024-04-28 LAB — URINE CULTURE: Culture: NO GROWTH

## 2024-04-28 LAB — GLUCOSE, CAPILLARY
Glucose-Capillary: 117 mg/dL — ABNORMAL HIGH (ref 70–99)
Glucose-Capillary: 115 mg/dL — ABNORMAL HIGH (ref 70–99)
Glucose-Capillary: 144 mg/dL — ABNORMAL HIGH (ref 70–99)
Glucose-Capillary: 122 mg/dL — ABNORMAL HIGH (ref 70–99)

## 2024-04-28 NOTE — TOC Initial Note (Signed)
 Transition of Care [facility]) - Initial/Assessment Note    Patient Details  Name: Virginia [name] MRN: [id number] Date of Birth: 1955/11/24  [facility]) CM/SW Contact:    Tawni CHRISTELLA Eva, LCSW Phone Number: 04/28/2024, 1:33 PM  Clinical Narrative:                  CSW spoke with the pt's spouse, Darina, to discuss recommendations for SNF placement. The spouse is agreeable and would like the pt to go to a facility where he can stay overnight with her. CSW explained the process: once the pt has a list of facilities that can offer a bed, he can contact them to discuss what admission would look like. The pt does not need insurance authorization. Care management to follow.  Expected Discharge Plan: Skilled Nursing Facility Barriers to Discharge: Continued Medical Work up   Patient Goals and CMS Choice Patient states their goals for this hospitalization and ongoing recovery are:: retrun home CMS Medicare.gov Compare Post Acute Care list provided to:: Patient Represenative (must comment) Choice offered to / list presented to : Spouse      Expected Discharge Plan and Services       Living arrangements for the past 2 months: Single Family Home                                      Prior Living Arrangements/Services Living arrangements for the past 2 months: Single Family Home Lives with:: Self, Spouse Patient language and need for interpreter reviewed:: Yes Do you feel safe going back to the place where you live?: Yes      Need for Family Participation in Patient Care: Yes (Comment) Care giver support system in place?: Yes (comment)   Criminal Activity/Legal Involvement Pertinent to Current Situation/Hospitalization: No - Comment as needed  Activities of Daily Living   ADL Screening (condition at time of admission) Independently performs ADLs?: No Does the patient have a NEW difficulty with bathing/dressing/toileting/self-feeding that is expected to last  >3 days?: No Does the patient have a NEW difficulty with getting in/out of bed, walking, or climbing stairs that is expected to last >3 days?: Yes (Initiates electronic notice to provider for possible PT consult) Does the patient have a NEW difficulty with communication that is expected to last >3 days?: No Is the patient deaf or have difficulty hearing?: No Does the patient have difficulty seeing, even when wearing glasses/contacts?: No Does the patient have difficulty concentrating, remembering, or making decisions?: Yes  Permission Sought/Granted                  Emotional Assessment       Orientation: : Oriented to Self   Psych Involvement: No (comment)  Admission diagnosis:  Hypocalcemia [E83.51] Hypokalemia [E87.6] Hypomagnesemia [E83.42] Pericardial effusion [I31.39] Acute cystitis without hematuria [N30.00] Multifocal pneumonia [J18.9] Sepsis with acute hypoxic respiratory failure without septic shock, due to unspecified organism (HCC) [A41.9, R65.20, J96.01] Patient Active Problem List   Diagnosis Date Noted   Rhinovirus infection 04/28/2024   Multifocal pneumonia 04/27/2024   UTI (urinary tract infection) 04/27/2024   GERD (gastroesophageal reflux disease) 04/27/2024   Pericardial effusion without cardiac tamponade 12/10/2023   Acute pulmonary embolism (HCC) 08/26/2023   Acute hypoxemic respiratory failure (HCC) 08/26/2023   SIRS (systemic inflammatory response syndrome) (HCC) 08/26/2023   Single subsegmental pulmonary embolism without acute cor pulmonale (HCC) 08/26/2023  Heart failure with reduced ejection fraction (HCC) 08/26/2023   Chronic pain 08/26/2023   Essential hypertension 08/26/2023   Type 2 diabetes mellitus with complication, without long-term current use of insulin  (HCC) 08/26/2023   Renal insufficiency 08/26/2023   Hypomagnesemia 06/30/2023   History of DVT (deep vein thrombosis) 06/10/2023   Urine retention 06/10/2023   Acute blood loss  anemia 06/10/2023   History of ventricular fibrillation 06/10/2023   Cardiac arrest (HCC) 06/03/2023   Need for emotional support 05/09/2023   Medication management 05/09/2023   Nausea 05/09/2023   DNR (do not resuscitate) 05/09/2023   Palliative care encounter 05/09/2023   Debility 05/08/2023   Counseling and coordination of care 05/08/2023   AKI (acute kidney injury) (HCC) 05/03/2023   Anemia 05/03/2023   Cancer associated pain 11/14/2021   Rash 11/14/2021   Hyponatremia 11/14/2021   Skin rash 11/13/2021   Hypokalemia 11/13/2021   Febrile illness 11/13/2021   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   Port-A-Cath in place 12/15/2020   Lung cancer metastatic to brain (HCC) 12/02/2020   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) 03/15/2020   Goals of care, counseling/discussion 02/25/2020   Palliative care patient 12/16/2019   Malignant neoplasm metastatic to bone (HCC) 10/21/2019   Malignant neoplasm metastatic to brain (HCC) 10/15/2019   Right lower lobe lung mass 09/25/2019   COPD (chronic obstructive pulmonary disease) (HCC) 09/25/2019   Impaired glucose tolerance 12/08/2017   Vitamin D  deficiency 12/08/2017   Microscopic hematuria 04/01/2014   Hypertension 01/11/2012   Palpitations 01/11/2012   Hyperlipidemia 12/25/2011   History of smoking 12/25/2011   Anxiety 04/29/2011   PCP:  Perri Ronal PARAS, MD Pharmacy:   [facility] [phone] - [place], [place] - [address] AT [facility] [address] [place] [address] Phone: [phone] Fax: [phone]     Social Drivers of Health (SDOH) Social History: SDOH Screenings   Food Insecurity: No Food Insecurity (04/27/2024)  Housing: Low Risk  (04/27/2024)  Transportation Needs: No Transportation Needs (04/27/2024)  Utilities: Not At Risk (04/27/2024)  Depression (PHQ2-9): Low Risk  (04/03/2024)  Social Connections: Moderately Isolated (04/27/2024)  Tobacco  Use: Medium Risk (04/26/2024)   SDOH Interventions:     Readmission Risk Interventions    12/11/2023   11:41 AM 06/06/2023    3:51 PM 05/07/2023    1:35 PM  Readmission Risk Prevention Plan  Transportation Screening Complete Complete Complete  PCP or Specialist Appt within 3-5 Days   Complete  HRI or Home Care Consult   Complete  Social Work Consult for Recovery Care Planning/Counseling   Complete  Palliative Care Screening   Not Applicable  Medication Review Oceanographer) Complete Complete Complete  HRI or Home Care Consult Complete Complete   SW Recovery Care/Counseling Consult Complete Complete   Palliative Care Screening -- Complete   Skilled Nursing Facility Not Applicable Not Applicable

## 2024-04-28 NOTE — Progress Notes (Signed)
 Progress Note    Virginia [name]   FMW:[id number]  DOB: [birth year]  DOA: 04/26/2024     1 PCP: Virginia Ronal PARAS, MD  Initial CC: Cough  Hospital Course: Virginia [name] is a 68 y.o. 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   Husband also states she has essentially left-sided hemiparesis ever since craniotomy.  She has been bedbound as of recently as well.  Patient is presenting with complaints of cough, runny nose, sore throat, shortness of breath, and generalized weakness for the past few days.  She did also endorse some urinary complaints with dysuria on admission. She had a video visit with her PCP on 8/15 and was prescribed azithromycin  which she has been taking without much improvement.  She denies fevers or chest pain.  Husband states patient has been having loose stools/diarrhea but she does take scheduled laxatives at home as she is on opioids for chronic pain.  Patient missed her immunotherapy treatment on Friday due to feeling sick.  Patient denies nausea, vomiting, or abdominal pain.  She is endorsing dysuria as noted.  CT chest/abdomen/pelvis was performed on admission.  Multifocal pneumonia appreciated with stable large pericardial effusion noted.  No acute abnormalities in the abdomen/pelvis. She was started on Rocephin  and doxycycline  and admitted for further workup.  Interval History:  Much more  awake and alert this morning.  Feeling better overall.  Husband also present bedside.  They are interested in obtaining choices for rehab and then deciding from there.  Assessment and Plan: * Multifocal pneumonia - Scattered centrilobular ground glass nodules considered infectious/inflammatory showing progression since May 2025 and bilateral lower lobe consolidation noted concerning for multifocal pneumonia -Some tachycardia on admission but very mild.  Afebrile, no leukocytosis - Procalcitonin negative - RVP positive with rhinovirus -Given CT appearance, continue on antibiotics for now especially with some clinical improvement since admission - COVID, flu, RSV negative - Continue on Rocephin  and doxycycline   Acute hypoxemic respiratory failure (HCC) - Not on oxygen at home - CT chest shows multifocal pneumonia with stable large pericardial effusion and trace right pleural effusion - Continuing treatment for pneumonia as above - Continue oxygen and wean as able  Physical deconditioning - Husband reports progressive debility and deconditioning at home ever since brain surgery - Does have ability to move all 4 extremities and follows commands easily.  Potential for rehabbing and strength improvement - Appreciate PT eval, SNF recommended and patient and husband are interested - TOC aware of SNF pursuit   Rhinovirus infection - RVP positive for rhinovirus - Continue droplet precautions - Continue supportive care  UTI (urinary tract infection) - Patient does endorse some urinary symptoms of dysuria on admission - UA consistent with infection; negative nitrite, large LE, greater than 50 WBC - Continue Rocephin  - No growth on urine culture.  Empiric treatment will be covered with treatment of pneumonia regardless  Pericardial effusion without cardiac tamponade - CT showing stable large pericardial effusion.  No signs of cardiac tamponade.  She is seen by  outpatient cardiology and being  treated with colchicine  and diuretics.  Continue home meds.   Hypokalemia - Replete as needed  Heart failure with reduced ejection fraction (HCC) Stable, continue home meds.  Last echo done in March 2025 showing EF 50 to 55%, grade 1 diastolic dysfunction, large pericardial effusion, mild aortic regurgitation.   Chronic pain Placed on OxyContin  15 mg every 12 hours which is hospital formulary replacement for her home Xtampza .  Continue Oxy IR 5 mg every 4 hours as needed.   Type 2 diabetes mellitus with complication, without long-term current use of insulin  (HCC) Not on meds. A1c 7.3 in September 2024, repeat ordered. Placed on sensitive sliding scale insulin  as she is on chronic steroids.   GERD (gastroesophageal reflux disease) - Continue Protonix   Hypomagnesemia - Replete as needed  COPD (chronic obstructive pulmonary disease) (HCC) No S/S exacerbation - Continue DuoNebs as needed  Hypertension - Continue Lasix   Anxiety Continue Xanax  PRN.     Old records reviewed in assessment of this patient  Antimicrobials: Rocephin  04/26/2024 >> current Doxycycline  04/27/2024 >> current  DVT prophylaxis:  SCDs Start: 04/27/24 0220   Code Status:   Code Status: Full Code  Mobility Assessment (Last 72 Hours)     Mobility Assessment     Row Name 04/27/24 1935 04/27/24 1800 04/27/24 0850 04/27/24 0301     Does the patient have exclusion criteria? No - Perform mobility assessment -- No - Perform mobility assessment No - Perform mobility assessment    What is the highest level of mobility based on the mobility assessment? Level 2 (Chairfast) - Balance while sitting on edge of bed and cannot stand Level 2 (Chairfast) - Balance while sitting on edge of bed and cannot stand Level 1 (Bedfast) - Unable to balance while sitting on edge of bed Level 2 (Chairfast) - Balance while sitting on edge of bed and cannot stand    Is the above level different from baseline mobility prior to current  illness? Yes - Recommend PT order -- Yes - Recommend PT order Yes - Recommend PT order       Barriers to discharge: None Disposition Plan: Home HH orders placed: TBD Status is: Inpatient  Objective: Blood pressure (!) 96/58, pulse 72, temperature 98 F (36.7 C), temperature source Oral, resp. rate 19, height 5' 2 (1.575 m), weight 85.2 kg, SpO2 93%.  Examination:  Physical Exam Constitutional:      General: She is not in acute distress.    Comments: Drowsy and sleepy this morning  HENT:     Head: Normocephalic and atraumatic.     Mouth/Throat:     Mouth: Mucous membranes are moist.  Eyes:     Extraocular Movements: Extraocular movements intact.  Cardiovascular:     Rate and Rhythm: Normal rate and regular rhythm.  Pulmonary:     Effort: Pulmonary effort is normal. No respiratory distress.     Breath sounds: Rhonchi (scattered) present. No wheezing.  Abdominal:     General: Bowel sounds are normal. There is no distension.     Palpations: Abdomen is soft.     Tenderness: There is no abdominal tenderness.  Musculoskeletal:        General: Normal range of motion.     Cervical back: Normal range of motion and neck supple.  Skin:    General: Skin is warm and dry.  Neurological:     Mental Status: She is alert.     Comments: Generalized weakness, worsened strength on  left  Psychiatric:        Mood and Affect: Mood normal.      Consultants:    Procedures:    Data Reviewed: Results for orders placed or performed during the hospital encounter of 04/26/24 (from the past 24 hours)  Glucose, capillary     Status: Abnormal   Collection Time: 04/27/24  4:20 PM  Result Value Ref Range   Glucose-Capillary 207 (H) 70 - 99 mg/dL  Glucose, capillary     Status: Abnormal   Collection Time: 04/27/24  7:59 PM  Result Value Ref Range   Glucose-Capillary 123 (H) 70 - 99 mg/dL  Glucose, capillary     Status: Abnormal   Collection Time: 04/27/24  9:11 PM  Result Value Ref Range    Glucose-Capillary 121 (H) 70 - 99 mg/dL  Glucose, capillary     Status: Abnormal   Collection Time: 04/28/24  7:19 AM  Result Value Ref Range   Glucose-Capillary 117 (H) 70 - 99 mg/dL  Glucose, capillary     Status: Abnormal   Collection Time: 04/28/24 11:00 AM  Result Value Ref Range   Glucose-Capillary 115 (H) 70 - 99 mg/dL   *Note: Due to a large number of results and/or encounters for the requested time period, some results have not been displayed. A complete set of results can be found in Results Review.    I have reviewed pertinent nursing notes, vitals, labs, and images as necessary. I have ordered labwork to follow up on as indicated.  I have reviewed the last notes from staff over past 24 hours. I have discussed patient's care plan and test results with nursing staff, CM/SW, and other staff as appropriate.  Time spent: Greater than 50% of the 55 minute visit was spent in counseling/coordination of care for the patient as laid out in the A&P.   LOS: 1 day   Alm Apo, MD [facility] 04/28/2024, 2:33 PM

## 2024-04-28 NOTE — Assessment & Plan Note (Signed)
-   RVP positive for rhinovirus - Continue droplet precautions - Continue supportive care

## 2024-04-28 NOTE — Assessment & Plan Note (Signed)
-   Husband reports progressive debility and deconditioning at home ever since brain surgery - Does have ability to move all 4 extremities and follows commands easily.  Potential for rehabbing and strength improvement - Appreciate PT eval, SNF recommended and patient and husband are interested - TOC aware of SNF pursuit

## 2024-04-28 NOTE — NC FL2 (Signed)
 [place]  MEDICAID FL2 LEVEL OF CARE FORM     IDENTIFICATION  Patient Name: Virginia [name] Birthdate: February 25, 1956 Sex: female Admission Date (Current Location): 04/26/2024  [facility] and [place] Number:  Producer, television/film/video and Address:  [facility],  [address] [place], [place] [address]      Provider Number: [id number]  Attending Physician Name and Address:  Patsy Lenis, MD  Relative Name and Phone Number:  Emma, Birchler (Spouse)  [phone] (Mobile)    Current Level of Care: Hospital Recommended Level of Care: Skilled Nursing Facility Prior Approval Number:    Date Approved/Denied:   PASRR Number: Pending  Discharge Plan: SNF    Current Diagnoses: Patient Active Problem List   Diagnosis Date Noted   Rhinovirus infection 04/28/2024   Physical deconditioning 04/28/2024   Multifocal pneumonia 04/27/2024   UTI (urinary tract infection) 04/27/2024   GERD (gastroesophageal reflux disease) 04/27/2024   Pericardial effusion without cardiac tamponade 12/10/2023   Acute pulmonary embolism (HCC) 08/26/2023   Acute hypoxemic respiratory failure (HCC) 08/26/2023   SIRS (systemic inflammatory response syndrome) (HCC) 08/26/2023   Single subsegmental pulmonary embolism without acute cor pulmonale (HCC) 08/26/2023   Heart failure with reduced ejection fraction (HCC) 08/26/2023   Chronic pain 08/26/2023   Essential hypertension 08/26/2023   Type 2 diabetes mellitus with complication, without long-term current use of insulin  (HCC) 08/26/2023   Renal insufficiency 08/26/2023   Hypomagnesemia 06/30/2023   History of DVT (deep vein thrombosis) 06/10/2023   Urine retention 06/10/2023   Acute blood loss anemia 06/10/2023   History of ventricular fibrillation 06/10/2023   Cardiac arrest (HCC) 06/03/2023   Need for emotional support 05/09/2023   Medication management 05/09/2023   Nausea 05/09/2023   DNR (do not resuscitate) 05/09/2023   Palliative care encounter  05/09/2023   Debility 05/08/2023   Counseling and coordination of care 05/08/2023   AKI (acute kidney injury) (HCC) 05/03/2023   Anemia 05/03/2023   Cancer associated pain 11/14/2021   Rash 11/14/2021   Hyponatremia 11/14/2021   Skin rash 11/13/2021   Hypokalemia 11/13/2021   Febrile illness 11/13/2021   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   Port-A-Cath in place 12/15/2020   Lung cancer metastatic to brain (HCC) 12/02/2020   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) 03/15/2020   Goals of care, counseling/discussion 02/25/2020   Palliative care patient 12/16/2019   Malignant neoplasm metastatic to bone (HCC) 10/21/2019   Malignant neoplasm metastatic to brain (HCC) 10/15/2019   Right lower lobe lung mass 09/25/2019   COPD (chronic obstructive pulmonary disease) (HCC) 09/25/2019   Impaired glucose tolerance 12/08/2017   Vitamin D  deficiency 12/08/2017   Microscopic hematuria 04/01/2014   Hypertension 01/11/2012   Palpitations 01/11/2012   Hyperlipidemia 12/25/2011   History of smoking 12/25/2011   Anxiety 04/29/2011    Orientation RESPIRATION BLADDER Height & Weight     Self, Time, Situation, Place  Normal Incontinent Weight: 187 lb 13.3 oz (85.2 kg) Height:  5' 2 (157.5 cm)  BEHAVIORAL SYMPTOMS/MOOD NEUROLOGICAL BOWEL NUTRITION STATUS      Incontinent Diet (regular)  AMBULATORY STATUS COMMUNICATION OF NEEDS Skin   Extensive Assist Verbally Normal                       Personal Care Assistance Level of Assistance  Bathing, Feeding, Dressing Bathing Assistance: Independent Feeding assistance: Limited assistance Dressing Assistance: Independent     Functional Limitations Info  Sight, Hearing, Speech Sight Info: Adequate Hearing  Info: Adequate Speech Info: Adequate    SPECIAL CARE FACTORS FREQUENCY  PT (By licensed PT), OT (By licensed OT)     PT Frequency: 5x a week OT Frequency: 5x a week            Contractures  Contractures Info: Not present    Additional Factors Info  Code Status, Allergies Code Status Info: full Allergies Info: Scopolamine            Current Medications (04/28/2024):  This is the current hospital active medication list Current Facility-Administered Medications  Medication Dose Route Frequency Provider Last Rate Last Admin   (feeding supplement) PROSource Plus liquid 30 mL  30 mL Oral QID Rathore, Vasundhra, MD   30 mL at 04/28/24 1356   acetaminophen  (TYLENOL ) tablet 650 mg  650 mg Oral Q6H PRN Alfornia Madison, MD       Or   acetaminophen  (TYLENOL ) suppository 650 mg  650 mg Rectal Q6H PRN Alfornia Madison, MD       ALPRAZolam  (XANAX ) tablet 0.25 mg  0.25 mg Oral BID PRN Alfornia Madison, MD       cefTRIAXone  (ROCEPHIN ) 2 g in sodium chloride  0.9 % 100 mL IVPB  2 g Intravenous Q24H Rathore, Vasundhra, MD 200 mL/hr at 04/27/24 1645 2 g at 04/27/24 1645   colchicine  tablet 0.6 mg  0.6 mg Oral Daily Rathore, Vasundhra, MD   0.6 mg at 04/28/24 9078   dexamethasone  (DECADRON ) tablet 0.5 mg  0.5 mg Oral Daily Rathore, Vasundhra, MD   0.5 mg at 04/28/24 9075   doxycycline  (VIBRAMYCIN ) 100 mg in sodium chloride  0.9 % 250 mL IVPB  100 mg Intravenous Q12H Rathore, Vasundhra, MD 125 mL/hr at 04/28/24 0404 100 mg at 04/28/24 0404   insulin  aspart (novoLOG ) injection 0-5 Units  0-5 Units Subcutaneous QHS Rathore, Vasundhra, MD       insulin  aspart (novoLOG ) injection 0-9 Units  0-9 Units Subcutaneous TID WC Rathore, Vasundhra, MD   3 Units at 04/27/24 1720   ipratropium-albuterol  (DUONEB) 0.5-2.5 (3) MG/3ML nebulizer solution 3 mL  3 mL Nebulization Q6H PRN Alfornia Madison, MD       levETIRAcetam  (KEPPRA ) tablet 1,000 mg  1,000 mg Oral BID Rathore, Vasundhra, MD   1,000 mg at 04/28/24 9078   meclizine  (ANTIVERT ) tablet 25 mg  25 mg Oral TID PRN Daniels, James K, NP   25 mg at 04/27/24 2011   naloxone  (NARCAN ) injection 0.4 mg  0.4 mg Intravenous PRN Rathore, Vasundhra, MD        oxyCODONE  (Oxy IR/ROXICODONE ) immediate release tablet 5 mg  5 mg Oral Q4H PRN Rathore, Vasundhra, MD   5 mg at 04/28/24 9770   oxyCODONE  (OXYCONTIN ) 12 hr tablet 15 mg  15 mg Oral Q12H Rathore, Vasundhra, MD   15 mg at 04/28/24 0920   pantoprazole  (PROTONIX ) EC tablet 40 mg  40 mg Oral Daily Rathore, Vasundhra, MD   40 mg at 04/28/24 9078     Discharge Medications: Please see discharge summary for a list of discharge medications.  Relevant Imaging Results:  Relevant Lab Results:   Additional Information [id number]  Tawni HERO Aradia Estey, LCSW

## 2024-04-29 DIAGNOSIS — J189 Pneumonia, unspecified organism: Secondary | ICD-10-CM | POA: Diagnosis not present

## 2024-04-29 LAB — GLUCOSE, CAPILLARY
Glucose-Capillary: 98 mg/dL (ref 70–99)
Glucose-Capillary: 136 mg/dL — ABNORMAL HIGH (ref 70–99)
Glucose-Capillary: 101 mg/dL — ABNORMAL HIGH (ref 70–99)
Glucose-Capillary: 127 mg/dL — ABNORMAL HIGH (ref 70–99)

## 2024-04-29 LAB — BASIC METABOLIC PANEL WITH GFR
Anion gap: 11 (ref 5–15)
BUN: 24 mg/dL — ABNORMAL HIGH (ref 8–23)
CO2: 24 mmol/L (ref 22–32)
Calcium: 8.4 mg/dL — ABNORMAL LOW (ref 8.9–10.3)
Chloride: 108 mmol/L (ref 98–111)
Creatinine, Ser: 1.09 mg/dL — ABNORMAL HIGH (ref 0.44–1.00)
GFR, Estimated: 56 mL/min — ABNORMAL LOW (ref >60)
Glucose, Bld: 112 mg/dL — ABNORMAL HIGH (ref 70–99)
Potassium: 2.8 mmol/L — ABNORMAL LOW (ref 3.5–5.1)
Sodium: 143 mmol/L (ref 135–145)

## 2024-04-29 MED ORDER — POTASSIUM CHLORIDE CRYS ER 20 MEQ PO TBCR
40.0000 meq | EXTENDED_RELEASE_TABLET | ORAL | Status: AC
  Administered 2024-04-29 (×2): 40 meq via ORAL
  Filled 2024-04-29 (×2): qty 2

## 2024-04-29 MED ORDER — GUAIFENESIN ER 600 MG PO TB12
600.0000 mg | ORAL_TABLET | Freq: Two times a day (BID) | ORAL | Status: DC
  Administered 2024-04-29 – 2024-05-01 (×5): 600 mg via ORAL
  Filled 2024-04-29 (×5): qty 1

## 2024-04-29 MED ORDER — POTASSIUM CHLORIDE 10 MEQ/100ML IV SOLN
10.0000 meq | INTRAVENOUS | Status: AC
  Administered 2024-04-29 (×3): 10 meq via INTRAVENOUS
  Filled 2024-04-29 (×2): qty 100

## 2024-04-29 MED ORDER — POLYETHYLENE GLYCOL 3350 17 G PO PACK
17.0000 g | PACK | Freq: Every day | ORAL | Status: DC
  Administered 2024-04-29 – 2024-05-01 (×3): 17 g via ORAL
  Filled 2024-04-29 (×3): qty 1

## 2024-04-29 MED ORDER — DOXYCYCLINE HYCLATE 100 MG PO TABS
100.0000 mg | ORAL_TABLET | Freq: Two times a day (BID) | ORAL | Status: DC
  Administered 2024-04-29 – 2024-05-01 (×4): 100 mg via ORAL
  Filled 2024-04-29 (×4): qty 1

## 2024-04-29 NOTE — Progress Notes (Signed)
 PROGRESS NOTE    Virginia [name]  FMW:[id number] DOB: March 16, 1956 DOA: 04/26/2024 PCP: Perri Ronal PARAS, MD   Brief Narrative: 68 year old with past medical history significant for metastatic lung cancer brain mets s/p radiation March 2021 and craniotomy with resection of cerebellar mass March 2022 currently on immunotherapy, history of DVT March 2021 and PE 2024 no longer on anticoagulation given history of brain mets with hemorrhagic conversion, seizure disorder, chronic pain on opioids, chronic constipation, hypertension, hyperlipidemia, anemia, GERD, COPD, pericardial effusion status post pericardiocentesis April 2025, CHF, NICM, history of V-fib arrest, diabetes diet-controlled, chronic left-sided hemiparesis since craniotomy presented with cough, sore throat, shortness of breath, admitted with diagnosis of pneumonia.  CT chest abdomen showed multifocal pneumonia with stable large pericardial effusion.   Assessment & Plan:   Principal Problem:   Multifocal pneumonia Active Problems:   Acute hypoxemic respiratory failure (HCC)   Malignant neoplasm metastatic to brain [facility])   UTI (urinary tract infection)   Rhinovirus infection   Physical deconditioning   Hypokalemia   Pericardial effusion without cardiac tamponade   Heart failure with reduced ejection fraction (HCC)   Chronic pain   Type 2 diabetes mellitus with complication, without long-term current use of insulin  (HCC)   Anxiety   Hyperlipidemia   Hypertension   COPD (chronic obstructive pulmonary disease) (HCC)   Hypomagnesemia   GERD (gastroesophageal reflux disease)   1-Multifocal pneumonia: Present with shortness of breath, CT chest with finding for multifocal pneumonia Treated with IV antibiotics to cover for superimposed bacterial infection -She has weaned off to room air  Acute hypoxic respiratory failure In setting of pneumonia Currently on room air  Hypokalemia: replete   Physical deconditioning -Plan for  rehab  Rhinovirus infection -Supportive care  UTI: Urine culture no growth.  Rocephin  should cover for both UTI and pneumonia  Pericardial effusion without cardiac tamponade - CT showing stable large pericardial effusion.  No signs of cardiac tamponade.  She is seen by outpatient cardiology and being treated with colchicine  and diuretics.  Continue home meds.    Hypokalemia - Replete as needed   Heart failure with reduced ejection fraction (HCC) Stable, continue home meds.  Last echo done in March 2025 showing EF 50 to 55%, grade 1 diastolic dysfunction, large pericardial effusion, mild aortic regurgitation.    Chronic pain Placed on OxyContin  15 mg every 12 hours which is hospital formulary replacement for her home Xtampza .  Continue Oxy IR 5 mg every 4 hours as needed.    Type 2 diabetes mellitus with complication, without long-term current use of insulin  (HCC) Not on meds. A1c 7.3 in September 2024, repeat ordered. Placed on sensitive sliding scale insulin  as she is on chronic steroids.    GERD (gastroesophageal reflux disease) - Continue Protonix    Hypomagnesemia - Replete as needed   COPD (chronic obstructive pulmonary disease) (HCC) No S/S exacerbation - Continue DuoNebs as needed   Hypertension - Continue Lasix    Anxiety Continue Xanax  PRN.       Estimated body mass index is 34.35 kg/m as calculated from the following:   Height as of this encounter: 5' 2 (1.575 m).   Weight as of this encounter: 85.2 kg.   DVT prophylaxis: SCD Code Status: Full code Family Communication: Husband at bedside Disposition Plan:  Status is: Inpatient Remains inpatient appropriate because: awaiting placement.     Consultants:  none  Procedures:  none Antimicrobials:    Subjective: Alert, report cough better. We talk about diagnosis of pre  diabetes, diabetes.    Objective: Vitals:   04/28/24 1350 04/28/24 2109 04/29/24 0644 04/29/24 1350  BP: (!) 96/58 103/65  114/72 126/85  Pulse: 72 84 70 73  Resp: 19 20 18 18   Temp: 98 F (36.7 C) 97.9 F (36.6 C) 97.9 F (36.6 C) 98.9 F (37.2 C)  TempSrc: Oral Oral Oral Oral  SpO2: 93% 98% 93% 93%  Weight:      Height:        Intake/Output Summary (Last 24 hours) at 04/29/2024 1450 Last data filed at 04/29/2024 1100 Gross per 24 hour  Intake 460 ml  Output 450 ml  Net 10 ml   Filed Weights   04/26/24 1623 04/27/24 0301  Weight: 84 kg 85.2 kg    Examination:  General exam: Appears calm and comfortable  Respiratory system: Clear to auscultation. Respiratory effort normal. Cardiovascular system: S1 & S2 heard, RRR. No JVD, murmurs, rubs, gallops or clicks. No pedal edema. Gastrointestinal system: Abdomen is nondistended, soft and nontender. No organomegaly or masses felt. Normal bowel sounds heard. Central nervous system: Alert and oriented.  Extremities: Symmetric 5 x 5 power.    Data Reviewed: I have personally reviewed following labs and imaging studies  CBC: Recent Labs  Lab 04/26/24 1633  WBC 9.1  NEUTROABS 5.3  HGB 12.8  HCT 38.4  MCV 96.5  PLT 214   Basic Metabolic Panel: Recent Labs  Lab 04/26/24 1903 04/27/24 0349 04/29/24 1053  NA 142 139 143  K 3.0* 4.2 2.8*  CL 108 105 108  CO2 20* 21* 24  GLUCOSE 97 149* 112*  BUN 19 21 24*  CREATININE 0.86 0.98 1.09*  CALCIUM  7.3* 8.6* 8.4*  MG 1.6* 2.1  --    GFR: Estimated Creatinine Clearance: 51.4 mL/min (A) (by C-G formula based on SCr of 1.09 mg/dL (H)). Liver Function Tests: Recent Labs  Lab 04/26/24 1903  AST 14*  ALT 13  ALKPHOS 39  BILITOT 0.9  PROT 5.0*  ALBUMIN 2.2*   No results for input(s): LIPASE, AMYLASE in the last 168 hours. No results for input(s): AMMONIA in the last 168 hours. Coagulation Profile: Recent Labs  Lab 04/26/24 1653  INR 1.1   Cardiac Enzymes: No results for input(s): CKTOTAL, CKMB, CKMBINDEX, TROPONINI in the last 168 hours. BNP (last 3 results) No results  for input(s): PROBNP in the last 8760 hours. HbA1C: Recent Labs    04/27/24 0349  HGBA1C 5.8*   CBG: Recent Labs  Lab 04/28/24 1100 04/28/24 1612 04/28/24 2105 04/29/24 0724 04/29/24 1154  GLUCAP 115* 144* 122* 98 136*   Lipid Profile: No results for input(s): CHOL, HDL, LDLCALC, TRIG, CHOLHDL, LDLDIRECT in the last 72 hours. Thyroid  Function Tests: No results for input(s): TSH, T4TOTAL, FREET4, T3FREE, THYROIDAB in the last 72 hours. Anemia Panel: No results for input(s): VITAMINB12, FOLATE, FERRITIN, TIBC, IRON, RETICCTPCT in the last 72 hours. Sepsis Labs: Recent Labs  Lab 04/26/24 1659 04/27/24 0349  PROCALCITON  --  <0.10  LATICACIDVEN 1.1  --     Recent Results (from the past 240 hours)  Blood culture (routine x 2)     Status: None (Preliminary result)   Collection Time: 04/26/24  4:53 PM   Specimen: BLOOD LEFT FOREARM  Result Value Ref Range Status   Specimen Description   Final    BLOOD LEFT FOREARM Performed at [facility], [address] [place], [place] [address]    Special Requests   Final  BOTTLES DRAWN AEROBIC AND ANAEROBIC Blood Culture adequate volume Performed at [facility], [address] [place], [place] [address]    Culture   Final    NO GROWTH 3 DAYS Performed at [facility], [address], [place], [place] [address]    Report Status PENDING  Incomplete  Resp panel by RT-PCR (RSV, Flu A&B, Covid) Anterior Nasal Swab     Status: None   Collection Time: 04/26/24  4:53 PM   Specimen: Anterior Nasal Swab  Result Value Ref Range Status   SARS Coronavirus 2 by RT PCR NEGATIVE NEGATIVE Final    Comment: (NOTE) SARS-CoV-2 target nucleic acids are NOT DETECTED.  The SARS-CoV-2 RNA is generally detectable in upper respiratory specimens during the acute phase of infection. The lowest concentration of SARS-CoV-2 viral copies this assay can detect is 138 copies/mL. A  negative result does not preclude SARS-Cov-2 infection and should not be used as the sole basis for treatment or other patient management decisions. A negative result may occur with  improper specimen collection/handling, submission of specimen other than nasopharyngeal swab, presence of viral mutation(s) within the areas targeted by this assay, and inadequate number of viral copies(<138 copies/mL). A negative result must be combined with clinical observations, patient history, and epidemiological information. The expected result is Negative.  Fact Sheet for Patients:  BloggerCourse.com  Fact Sheet for Healthcare Providers:  SeriousBroker.it  This test is no t yet approved or cleared by the [place]  FDA and  has been authorized for detection and/or diagnosis of SARS-CoV-2 by FDA under an Emergency Use Authorization (EUA). This EUA will remain  in effect (meaning this test can be used) for the duration of the COVID-19 declaration under Section 564(b)(1) of the Act, 21 U.S.C.section 360bbb-3(b)(1), unless the authorization is terminated  or revoked sooner.       Influenza A by PCR NEGATIVE NEGATIVE Final   Influenza B by PCR NEGATIVE NEGATIVE Final    Comment: (NOTE) The Xpert Xpress SARS-CoV-2/FLU/RSV plus assay is intended as an aid in the diagnosis of influenza from Nasopharyngeal swab specimens and should not be used as a sole basis for treatment. Nasal washings and aspirates are unacceptable for Xpert Xpress SARS-CoV-2/FLU/RSV testing.  Fact Sheet for Patients: BloggerCourse.com  Fact Sheet for Healthcare Providers: SeriousBroker.it  This test is not yet approved or cleared by the [place]  FDA and has been authorized for detection and/or diagnosis of SARS-CoV-2 by FDA under an Emergency Use Authorization (EUA). This EUA will remain in effect (meaning this test can  be used) for the duration of the COVID-19 declaration under Section 564(b)(1) of the Act, 21 U.S.C. section 360bbb-3(b)(1), unless the authorization is terminated or revoked.     Resp Syncytial Virus by PCR NEGATIVE NEGATIVE Final    Comment: (NOTE) Fact Sheet for Patients: BloggerCourse.com  Fact Sheet for Healthcare Providers: SeriousBroker.it  This test is not yet approved or cleared by the [place]  FDA and has been authorized for detection and/or diagnosis of SARS-CoV-2 by FDA under an Emergency Use Authorization (EUA). This EUA will remain in effect (meaning this test can be used) for the duration of the COVID-19 declaration under Section 564(b)(1) of the Act, 21 U.S.C. section 360bbb-3(b)(1), unless the authorization is terminated or revoked.  Performed at [facility], [address], [place], [place] [address]   Blood culture (routine x 2)     Status: None (Preliminary result)   Collection Time: 04/26/24  7:10 PM  Specimen: BLOOD  Result Value Ref Range Status   Specimen Description   Final    BLOOD BLOOD LEFT HAND Performed at [facility], [address], [place], [place] [address]    Special Requests   Final    BOTTLES DRAWN AEROBIC ONLY Blood Culture results may not be optimal due to an inadequate volume of blood received in culture bottles Performed at [facility], [address], [place], [place] [address]    Culture   Final    NO GROWTH 3 DAYS Performed at [facility], [address] [place], [place] [address]    Report Status PENDING  Incomplete  Urine Culture     Status: None   Collection Time: 04/26/24  9:46 PM   Specimen: Urine, Random  Result Value Ref Range Status   Specimen Description URINE, RANDOM  Final   Special Requests NONE Reflexed from [id number]  Final   Culture   Final    NO GROWTH Performed at [facility],  [address] [place], [place] [address]    Report Status 04/28/2024 FINAL  Final  Respiratory (~20 pathogens) panel by PCR     Status: Abnormal   Collection Time: 04/27/24  3:35 AM   Specimen: Nasopharyngeal Swab; Respiratory  Result Value Ref Range Status   Adenovirus NOT DETECTED NOT DETECTED Final   Coronavirus 229E NOT DETECTED NOT DETECTED Final    Comment: (NOTE) The Coronavirus on the Respiratory Panel, DOES NOT test for the novel  Coronavirus (2019 nCoV)    Coronavirus HKU1 NOT DETECTED NOT DETECTED Final   Coronavirus NL63 NOT DETECTED NOT DETECTED Final   Coronavirus OC43 NOT DETECTED NOT DETECTED Final   Metapneumovirus NOT DETECTED NOT DETECTED Final   Rhinovirus / Enterovirus DETECTED (A) NOT DETECTED Final   Influenza A NOT DETECTED NOT DETECTED Final   Influenza B NOT DETECTED NOT DETECTED Final   Parainfluenza Virus 1 NOT DETECTED NOT DETECTED Final   Parainfluenza Virus 2 NOT DETECTED NOT DETECTED Final   Parainfluenza Virus 3 NOT DETECTED NOT DETECTED Final   Parainfluenza Virus 4 NOT DETECTED NOT DETECTED Final   Respiratory Syncytial Virus NOT DETECTED NOT DETECTED Final   Bordetella pertussis NOT DETECTED NOT DETECTED Final   Bordetella Parapertussis NOT DETECTED NOT DETECTED Final   Chlamydophila pneumoniae NOT DETECTED NOT DETECTED Final   Mycoplasma pneumoniae NOT DETECTED NOT DETECTED Final    Comment: Performed at [facility], [address], [place], [place] [address]         Radiology Studies: No results found.      Scheduled Meds:  (feeding supplement) PROSource Plus  30 mL Oral QID   colchicine   0.6 mg Oral Daily   dexamethasone   0.5 mg Oral Daily   doxycycline   100 mg Oral Q12H   guaiFENesin   600 mg Oral BID   insulin  aspart  0-5 Units Subcutaneous QHS   insulin  aspart  0-9 Units Subcutaneous TID WC   levETIRAcetam   1,000 mg Oral BID   oxyCODONE   15 mg Oral Q12H   pantoprazole   40 mg Oral Daily   polyethylene glycol  17 g Oral  Daily   Continuous Infusions:  cefTRIAXone  (ROCEPHIN )  IV 2 g (04/28/24 1627)     LOS: 2 days    Time spent: 35 Minutes    Pansey Pinheiro A Kooper Chriswell, MD [facility]   If 7PM-7AM, please contact night-coverage [email or web address]  04/29/2024, 2:50 PM

## 2024-04-29 NOTE — TOC Progression Note (Addendum)
 Transition of Care [facility]) - Progression Note    Patient Details  Name: Virginia [name] MRN: [id number] Date of Birth: September 15, 1955  [facility] CM/SW Contact  Tawni CHRISTELLA Eva, LCSW Phone Number: 04/29/2024, 2:36 PM  Clinical Narrative:     Bed offers presented, pt and spouse would like time to review. Care management to follow.   Adden  3:30pm PASRR pending. Care management to follow.   Expected Discharge Plan: Skilled Nursing Facility Barriers to Discharge: Continued Medical Work up               Expected Discharge Plan and Services       Living arrangements for the past 2 months: Single Family Home                                       Social Drivers of Health (SDOH) Interventions SDOH Screenings   Food Insecurity: No Food Insecurity (04/27/2024)  Housing: Low Risk  (04/27/2024)  Transportation Needs: No Transportation Needs (04/27/2024)  Utilities: Not At Risk (04/27/2024)  Depression (PHQ2-9): Low Risk  (04/03/2024)  Social Connections: Moderately Isolated (04/27/2024)  Tobacco Use: Medium Risk (04/26/2024)    Readmission Risk Interventions    12/11/2023   11:41 AM 06/06/2023    3:51 PM 05/07/2023    1:35 PM  Readmission Risk Prevention Plan  Transportation Screening Complete Complete Complete  PCP or Specialist Appt within 3-5 Days   Complete  HRI or Home Care Consult   Complete  Social Work Consult for Recovery Care Planning/Counseling   Complete  Palliative Care Screening   Not Applicable  Medication Review Oceanographer) Complete Complete Complete  HRI or Home Care Consult Complete Complete   SW Recovery Care/Counseling Consult Complete Complete   Palliative Care Screening -- Complete   Skilled Nursing Facility Not Applicable Not Applicable

## 2024-04-29 NOTE — Plan of Care (Signed)

## 2024-04-29 NOTE — Plan of Care (Signed)
  Problem: Metabolic: Goal: Ability to maintain appropriate glucose levels will improve Outcome: Progressing   Problem: Clinical Measurements: Goal: Diagnostic test results will improve Outcome: Progressing Goal: Respiratory complications will improve Outcome: Progressing Goal: Cardiovascular complication will be avoided Outcome: Progressing   Problem: Pain Managment: Goal: General experience of comfort will improve and/or be controlled Outcome: Progressing

## 2024-04-30 DIAGNOSIS — J189 Pneumonia, unspecified organism: Secondary | ICD-10-CM | POA: Diagnosis not present

## 2024-04-30 LAB — BASIC METABOLIC PANEL WITH GFR
Anion gap: 7 (ref 5–15)
BUN: 25 mg/dL — ABNORMAL HIGH (ref 8–23)
CO2: 23 mmol/L (ref 22–32)
Calcium: 8.2 mg/dL — ABNORMAL LOW (ref 8.9–10.3)
Chloride: 111 mmol/L (ref 98–111)
Creatinine, Ser: 0.87 mg/dL (ref 0.44–1.00)
GFR, Estimated: >60 mL/min (ref >60)
Glucose, Bld: 104 mg/dL — ABNORMAL HIGH (ref 70–99)
Potassium: 3.9 mmol/L (ref 3.5–5.1)
Sodium: 141 mmol/L (ref 135–145)

## 2024-04-30 LAB — GLUCOSE, CAPILLARY
Glucose-Capillary: 97 mg/dL (ref 70–99)
Glucose-Capillary: 103 mg/dL — ABNORMAL HIGH (ref 70–99)
Glucose-Capillary: 120 mg/dL — ABNORMAL HIGH (ref 70–99)
Glucose-Capillary: 111 mg/dL — ABNORMAL HIGH (ref 70–99)

## 2024-04-30 MED ORDER — IPRATROPIUM-ALBUTEROL 0.5-2.5 (3) MG/3ML IN SOLN
3.0000 mL | Freq: Four times a day (QID) | RESPIRATORY_TRACT | Status: DC | PRN
  Filled 2024-04-30: qty 3

## 2024-04-30 MED ORDER — ONDANSETRON HCL 4 MG/2ML IJ SOLN
4.0000 mg | Freq: Four times a day (QID) | INTRAMUSCULAR | Status: DC | PRN
  Filled 2024-04-30: qty 2

## 2024-04-30 MED ORDER — IPRATROPIUM-ALBUTEROL 0.5-2.5 (3) MG/3ML IN SOLN
3.0000 mL | Freq: Three times a day (TID) | RESPIRATORY_TRACT | Status: DC
  Administered 2024-04-30 – 2024-05-01 (×4): 3 mL via RESPIRATORY_TRACT
  Filled 2024-04-30 (×5): qty 3

## 2024-04-30 NOTE — Progress Notes (Signed)
 PROGRESS NOTE    Virginia [name]  FMW:[id number] DOB: [birth year] DOA: 04/26/2024 PCP: Perri Ronal PARAS, MD   Brief Narrative: 68 year old with past medical history significant for metastatic lung cancer brain mets s/p radiation March 2021 and craniotomy with resection of cerebellar mass March 2022 currently on immunotherapy, history of DVT March 2021 and PE 2024 no longer on anticoagulation given history of brain mets with hemorrhagic conversion, seizure disorder, chronic pain on opioids, chronic constipation, hypertension, hyperlipidemia, anemia, GERD, COPD, pericardial effusion status post pericardiocentesis April 2025, CHF, NICM, history of V-fib arrest, diabetes diet-controlled, chronic left-sided hemiparesis since craniotomy presented with cough, sore throat, shortness of breath, admitted with diagnosis of pneumonia.  CT chest abdomen showed multifocal pneumonia with stable large pericardial effusion.   Assessment & Plan:   Principal Problem:   Multifocal pneumonia Active Problems:   Acute hypoxemic respiratory failure (HCC)   Malignant neoplasm metastatic to brain [facility])   UTI (urinary tract infection)   Rhinovirus infection   Physical deconditioning   Hypokalemia   Pericardial effusion without cardiac tamponade   Heart failure with reduced ejection fraction (HCC)   Chronic pain   Type 2 diabetes mellitus with complication, without long-term current use of insulin  (HCC)   Anxiety   Hyperlipidemia   Hypertension   COPD (chronic obstructive pulmonary disease) (HCC)   Hypomagnesemia   GERD (gastroesophageal reflux disease)   1-Multifocal pneumonia: Present with shortness of breath, CT chest with finding for multifocal pneumonia Treated with IV antibiotics to cover for superimposed bacterial infection -Had more cough last night. Oxygen 91 RA, placed on 2 L.  Continue with antibiotics, plan to treat for 7 days.  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   Acute hypoxic respiratory  failure In setting of pneumonia Placed on 2 L oxygen.  Will order Nocturnal pulse oxymetry  Hypokalemia: Replaced.   Physical deconditioning -Plan for rehab  Rhinovirus infection -Supportive care  UTI: Urine culture no growth.  Rocephin  should cover for both UTI and pneumonia  Pericardial effusion without cardiac tamponade - CT showing stable large pericardial effusion.  No signs of cardiac tamponade.  She is seen by outpatient cardiology and being treated with colchicine  and diuretics.  Continue home meds.    Hypokalemia - Replete as needed   Heart failure with reduced ejection fraction (HCC) Stable, continue home meds.  Last echo done in March 2025 showing EF 50 to 55%, grade 1 diastolic dysfunction, large pericardial effusion, mild aortic regurgitation.    Chronic pain Placed on OxyContin  15 mg every 12 hours which is hospital formulary replacement for her home Xtampza .  Continue Oxy IR 5 mg every 4 hours as needed.    Type 2 diabetes mellitus with complication, without long-term current use of insulin  (HCC) Not on meds. A1c 7.3 in September 2024, repeat ordered. Placed on sensitive sliding scale insulin  as she is on chronic steroids.    GERD (gastroesophageal reflux disease) - Continue Protonix    Hypomagnesemia - Replete as needed   COPD (chronic obstructive pulmonary disease) (HCC) No S/S exacerbation - Continue DuoNebs as needed  -Will schedule nebulizer.   Hypertension - Continue Lasix    Anxiety Continue Xanax  PRN.       Estimated body mass index is 34.35 kg/m as calculated from the following:   Height as of this encounter: 5' 2 (1.575 m).   Weight as of this encounter: 85.2 kg.   DVT prophylaxis: SCD Code Status: Full code Family Communication: Husband at bedside Disposition Plan:  Status is: Inpatient  Remains inpatient appropriate because: awaiting placement.     Consultants:  none  Procedures:  none Antimicrobials:     Subjective: Couldn't sleep last night, due to coughing spell.    Objective: Vitals:   04/29/24 2051 04/30/24 0436 04/30/24 1251 04/30/24 1430  BP: 119/79 (!) 125/92 124/76   Pulse: 78 81 93   Resp: 17 17 18    Temp: 98.2 F (36.8 C) 98.8 F (37.1 C) 98.4 F (36.9 C)   TempSrc: Oral Oral    SpO2: 95% 91% 99% 98%  Weight:      Height:        Intake/Output Summary (Last 24 hours) at 04/30/2024 1548 Last data filed at 04/30/2024 0600 Gross per 24 hour  Intake 760 ml  Output 1100 ml  Net -340 ml   Filed Weights   04/26/24 1623 04/27/24 0301  Weight: 84 kg 85.2 kg    Examination:  General exam: NAD Respiratory system: CTA Cardiovascular system: S 1, S 2 RRR Gastrointestinal system: BS present, soft, nt Central nervous system: Alert, follows command.   Extremities: no edema    Data Reviewed: I have personally reviewed following labs and imaging studies  CBC: Recent Labs  Lab 04/26/24 1633  WBC 9.1  NEUTROABS 5.3  HGB 12.8  HCT 38.4  MCV 96.5  PLT 214   Basic Metabolic Panel: Recent Labs  Lab 04/26/24 1903 04/27/24 0349 04/29/24 1053 04/30/24 0431  NA 142 139 143 141  K 3.0* 4.2 2.8* 3.9  CL 108 105 108 111  CO2 20* 21* 24 23  GLUCOSE 97 149* 112* 104*  BUN 19 21 24* 25*  CREATININE 0.86 0.98 1.09* 0.87  CALCIUM  7.3* 8.6* 8.4* 8.2*  MG 1.6* 2.1  --   --    GFR: Estimated Creatinine Clearance: 64.4 mL/min (by C-G formula based on SCr of 0.87 mg/dL). Liver Function Tests: Recent Labs  Lab 04/26/24 1903  AST 14*  ALT 13  ALKPHOS 39  BILITOT 0.9  PROT 5.0*  ALBUMIN 2.2*   No results for input(s): LIPASE, AMYLASE in the last 168 hours. No results for input(s): AMMONIA in the last 168 hours. Coagulation Profile: Recent Labs  Lab 04/26/24 1653  INR 1.1   Cardiac Enzymes: No results for input(s): CKTOTAL, CKMB, CKMBINDEX, TROPONINI in the last 168 hours. BNP (last 3 results) No results for input(s): PROBNP in the last  8760 hours. HbA1C: No results for input(s): HGBA1C in the last 72 hours.  CBG: Recent Labs  Lab 04/29/24 1154 04/29/24 1633 04/29/24 2048 04/30/24 0743 04/30/24 1150  GLUCAP 136* 101* 127* 97 103*   Lipid Profile: No results for input(s): CHOL, HDL, LDLCALC, TRIG, CHOLHDL, LDLDIRECT in the last 72 hours. Thyroid  Function Tests: No results for input(s): TSH, T4TOTAL, FREET4, T3FREE, THYROIDAB in the last 72 hours. Anemia Panel: No results for input(s): VITAMINB12, FOLATE, FERRITIN, TIBC, IRON, RETICCTPCT in the last 72 hours. Sepsis Labs: Recent Labs  Lab 04/26/24 1659 04/27/24 0349  PROCALCITON  --  <0.10  LATICACIDVEN 1.1  --     Recent Results (from the past 240 hours)  Blood culture (routine x 2)     Status: None (Preliminary result)   Collection Time: 04/26/24  4:53 PM   Specimen: BLOOD LEFT FOREARM  Result Value Ref Range Status   Specimen Description   Final    BLOOD LEFT FOREARM Performed at [facility], [address] [place], [place] [address]    Special Requests   Final  BOTTLES DRAWN AEROBIC AND ANAEROBIC Blood Culture adequate volume Performed at [facility] [place], [address], [place], [place] [address]    Culture   Final    NO GROWTH 4 DAYS Performed at [facility], [address], [place], [place] [address]    Report Status PENDING  Incomplete  Resp panel by RT-PCR (RSV, Flu A&B, Covid) Anterior Nasal Swab     Status: None   Collection Time: 04/26/24  4:53 PM   Specimen: Anterior Nasal Swab  Result Value Ref Range Status   SARS Coronavirus 2 by RT PCR NEGATIVE NEGATIVE Final    Comment: (NOTE) SARS-CoV-2 target nucleic acids are NOT DETECTED.  The SARS-CoV-2 RNA is generally detectable in upper respiratory specimens during the acute phase of infection. The lowest concentration of SARS-CoV-2 viral copies this assay can detect is 138 copies/mL. A negative result does not  preclude SARS-Cov-2 infection and should not be used as the sole basis for treatment or other patient management decisions. A negative result may occur with  improper specimen collection/handling, submission of specimen other than nasopharyngeal swab, presence of viral mutation(s) within the areas targeted by this assay, and inadequate number of viral copies(<138 copies/mL). A negative result must be combined with clinical observations, patient history, and epidemiological information. The expected result is Negative.  Fact Sheet for Patients:  BloggerCourse.com  Fact Sheet for Healthcare Providers:  SeriousBroker.it  This test is no t yet approved or cleared by the [place]  FDA and  has been authorized for detection and/or diagnosis of SARS-CoV-2 by FDA under an Emergency Use Authorization (EUA). This EUA will remain  in effect (meaning this test can be used) for the duration of the COVID-19 declaration under Section 564(b)(1) of the Act, 21 U.S.C.section 360bbb-3(b)(1), unless the authorization is terminated  or revoked sooner.       Influenza A by PCR NEGATIVE NEGATIVE Final   Influenza B by PCR NEGATIVE NEGATIVE Final    Comment: (NOTE) The Xpert Xpress SARS-CoV-2/FLU/RSV plus assay is intended as an aid in the diagnosis of influenza from Nasopharyngeal swab specimens and should not be used as a sole basis for treatment. Nasal washings and aspirates are unacceptable for Xpert Xpress SARS-CoV-2/FLU/RSV testing.  Fact Sheet for Patients: BloggerCourse.com  Fact Sheet for Healthcare Providers: SeriousBroker.it  This test is not yet approved or cleared by the [place]  FDA and has been authorized for detection and/or diagnosis of SARS-CoV-2 by FDA under an Emergency Use Authorization (EUA). This EUA will remain in effect (meaning this test can be used) for the  duration of the COVID-19 declaration under Section 564(b)(1) of the Act, 21 U.S.C. section 360bbb-3(b)(1), unless the authorization is terminated or revoked.     Resp Syncytial Virus by PCR NEGATIVE NEGATIVE Final    Comment: (NOTE) Fact Sheet for Patients: BloggerCourse.com  Fact Sheet for Healthcare Providers: SeriousBroker.it  This test is not yet approved or cleared by the [place]  FDA and has been authorized for detection and/or diagnosis of SARS-CoV-2 by FDA under an Emergency Use Authorization (EUA). This EUA will remain in effect (meaning this test can be used) for the duration of the COVID-19 declaration under Section 564(b)(1) of the Act, 21 U.S.C. section 360bbb-3(b)(1), unless the authorization is terminated or revoked.  Performed at [facility], [address] [place], [place] [address]   Blood culture (routine x 2)     Status: None (Preliminary result)   Collection Time: 04/26/24  7:10 PM  Specimen: BLOOD  Result Value Ref Range Status   Specimen Description   Final    BLOOD BLOOD LEFT HAND Performed at [facility], [address] [place], [place] [address]    Special Requests   Final    BOTTLES DRAWN AEROBIC ONLY Blood Culture results may not be optimal due to an inadequate volume of blood received in culture bottles Performed at [facility], [address], [place], [place] [address]    Culture   Final    NO GROWTH 4 DAYS Performed at [facility], [address] [place], [place] [address]    Report Status PENDING  Incomplete  Urine Culture     Status: None   Collection Time: 04/26/24  9:46 PM   Specimen: Urine, Random  Result Value Ref Range Status   Specimen Description URINE, RANDOM  Final   Special Requests NONE Reflexed from [id number]  Final   Culture   Final    NO GROWTH Performed at [facility], [address],  [place], [place] [address]    Report Status 04/28/2024 FINAL  Final  Respiratory (~20 pathogens) panel by PCR     Status: Abnormal   Collection Time: 04/27/24  3:35 AM   Specimen: Nasopharyngeal Swab; Respiratory  Result Value Ref Range Status   Adenovirus NOT DETECTED NOT DETECTED Final   Coronavirus 229E NOT DETECTED NOT DETECTED Final    Comment: (NOTE) The Coronavirus on the Respiratory Panel, DOES NOT test for the novel  Coronavirus (2019 nCoV)    Coronavirus HKU1 NOT DETECTED NOT DETECTED Final   Coronavirus NL63 NOT DETECTED NOT DETECTED Final   Coronavirus OC43 NOT DETECTED NOT DETECTED Final   Metapneumovirus NOT DETECTED NOT DETECTED Final   Rhinovirus / Enterovirus DETECTED (A) NOT DETECTED Final   Influenza A NOT DETECTED NOT DETECTED Final   Influenza B NOT DETECTED NOT DETECTED Final   Parainfluenza Virus 1 NOT DETECTED NOT DETECTED Final   Parainfluenza Virus 2 NOT DETECTED NOT DETECTED Final   Parainfluenza Virus 3 NOT DETECTED NOT DETECTED Final   Parainfluenza Virus 4 NOT DETECTED NOT DETECTED Final   Respiratory Syncytial Virus NOT DETECTED NOT DETECTED Final   Bordetella pertussis NOT DETECTED NOT DETECTED Final   Bordetella Parapertussis NOT DETECTED NOT DETECTED Final   Chlamydophila pneumoniae NOT DETECTED NOT DETECTED Final   Mycoplasma pneumoniae NOT DETECTED NOT DETECTED Final    Comment: Performed at [facility], [address], [place], [place] [address]         Radiology Studies: No results found.      Scheduled Meds:  (feeding supplement) PROSource Plus  30 mL Oral QID   colchicine   0.6 mg Oral Daily   dexamethasone   0.5 mg Oral Daily   doxycycline   100 mg Oral Q12H   guaiFENesin   600 mg Oral BID   insulin  aspart  0-5 Units Subcutaneous QHS   insulin  aspart  0-9 Units Subcutaneous TID WC   ipratropium-albuterol   3 mL Nebulization TID   levETIRAcetam   1,000 mg Oral BID   oxyCODONE   15 mg Oral Q12H   pantoprazole   40 mg Oral Daily    polyethylene glycol  17 g Oral Daily   Continuous Infusions:  cefTRIAXone  (ROCEPHIN )  IV 2 g (04/29/24 1634)     LOS: 3 days    Time spent: 35 Minutes    Dezarai Prew A Lashena Signer, MD [facility]   If 7PM-7AM, please contact night-coverage  [email or web address]  04/30/2024, 3:48 PM

## 2024-04-30 NOTE — Progress Notes (Signed)
 Physical Therapy Treatment Patient Details Name: Virginia [name] MRN: [id number] DOB: [birth year] Today's Date: 04/30/2024   History of Present Illness 68 yo female presents to therapy following hospital admission secondary to cough, runny nose, sore throat and SOB with general weakness over the past few days. Pt also has been having loose stools and missed immunotherapy. Pt found to be hypoxic requiring 2 L/min and found to have PNA and Rhinovirus infection. 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    PT Comments  Pt reports fatigue this morning and declined assisted bed mobility.  Pt did perform a few LE exercises however fatigued quickly.  RN reports pt had been rolling a lot this morning (significant +2 assist reported).  Spouse into room and states pt did not sleep well and cognition is a little off today.  Spouse anticipates pt to d/c to SNF for improving strength, mobility, function prior to returning home.  Pt had started to decline in regards to mobility prior to admission however was working on ambulating approx 15 ft with HHPT on good days.  Spouse would also like to be able to assist pt with transfer in/out of car (in order to bring her home) so hopeful this will be possible by the time she completes SNF rehab.      If plan is discharge home, recommend the following: Two people to help with walking and/or transfers;Two people to help with bathing/dressing/bathroom;Assistance with cooking/housework;Help with stairs or ramp for entrance   Can travel by private vehicle        Equipment Recommendations  None recommended by PT    Recommendations for Other Services       Precautions / Restrictions Precautions Precautions:  Fall Precaution/Restrictions Comments: essentially left-sided hemiparesis ever since craniotomy (per spouse and pt)     Mobility  Bed Mobility Overal bed mobility: Needs Assistance             General bed mobility comments: max to total assist +2 for repositioning in bed (RN assisted)    Transfers                        Ambulation/Gait                   Stairs             Wheelchair Mobility     Tilt Bed    Modified Rankin (Stroke Patients Only)       Balance                                            Communication Communication Communication: No apparent difficulties  Cognition Arousal: Alert Behavior During Therapy: WFL for tasks assessed/performed   PT - Cognitive impairments: No apparent impairments                         Following commands: Intact      Cueing    Exercises Total Joint Exercises Ankle Circles/Pumps: AROM, Both, 10 reps Short Arc Quad: AROM, AAROM, Both, 10 reps, Limitations Short Arc Quad Limitations: left LE appears weaker (pt reports  hx of hemiparesis) Heel Slides: AROM, Right, 5 reps    General Comments        Pertinent Vitals/Pain Pain Assessment Pain Assessment: Faces Faces Pain Scale: Hurts a little bit Pain Location: back Pain Descriptors / Indicators: Aching, Constant, Discomfort, Dull Pain Intervention(s): Monitored during session, Repositioned    Home Living                          Prior Function            PT Goals (current goals can now be found in the care plan section) Progress towards PT goals: Progressing toward goals    Frequency    Min 2X/week      PT Plan      Co-evaluation              AM-PAC PT 6 Clicks Mobility   Outcome Measure  Help needed turning from your back to your side while in a flat bed without using bedrails?: A Lot Help needed moving from lying on your back to sitting on the side of a flat bed  without using bedrails?: A Lot Help needed moving to and from a bed to a chair (including a wheelchair)?: Total Help needed standing up from a chair using your arms (e.g., wheelchair or bedside chair)?: Total Help needed to walk in hospital room?: Total Help needed climbing 3-5 steps with a railing? : Total 6 Click Score: 8    End of Session   Activity Tolerance: Patient limited by fatigue Patient left: in bed;with call bell/phone within reach;with family/visitor present;with nursing/sitter in room;with SCD's reapplied   PT Visit Diagnosis: Muscle weakness (generalized) (M62.81)     Time: 8880-8864 PT Time Calculation (min) (ACUTE ONLY): 16 min  Charges:    $Therapeutic Activity: 8-22 mins PT General Charges $$ ACUTE PT VISIT: 1 Visit                     Tari PT, DPT Physical Therapist Acute Rehabilitation Services Office: [phone]   Tari CROME Payson 04/30/2024, 1:12 PM

## 2024-04-30 NOTE — Progress Notes (Signed)
 Overnight pulse oximetry study started on patient at 22:28.  Patient on 2Lpm nasal cannula at this time.

## 2024-04-30 NOTE — Progress Notes (Signed)
 Ordered Flutter will be instructed at scheduled TID nebulizer treatment. PT was sleepy during 1430 RT visit.

## 2024-04-30 NOTE — Plan of Care (Incomplete)
  Problem: Education: Goal: Ability to describe self-care measures that may prevent or decrease complications (Diabetes Survival Skills Education) will improve Outcome: Progressing   Problem: Coping: Goal: Ability to adjust to condition or change in health will improve Outcome: Progressing   Problem: Fluid Volume: Goal: Ability to maintain a balanced intake and output will improve Outcome: Progressing   Problem: Health Behavior/Discharge Planning: Goal: Ability to manage health-related needs will improve Outcome: Progressing   Problem: Nutritional: Goal: Maintenance of adequate nutrition will improve Outcome: Progressing Goal: Progress toward achieving an optimal weight will improve Outcome: Progressing   Problem: Skin Integrity: Goal: Risk for impaired skin integrity will decrease Outcome: Progressing   Problem: Tissue Perfusion: Goal: Adequacy of tissue perfusion will improve Outcome: Progressing   Problem: Health Behavior/Discharge Planning: Goal: Ability to manage health-related needs will improve Outcome: Progressing   Problem: Clinical Measurements: Goal: Ability to maintain clinical measurements within normal limits will improve Outcome: Progressing Goal: Will remain free from infection Outcome: Progressing Goal: Diagnostic test results will improve Outcome: Progressing Goal: Respiratory complications will improve Outcome: Progressing   Problem: Activity: Goal: Risk for activity intolerance will decrease Outcome: Progressing   Problem: Nutrition: Goal: Adequate nutrition will be maintained Outcome: Progressing   Problem: Coping: Goal: Level of anxiety will decrease Outcome: Progressing   Problem: Pain Managment: Goal: General experience of comfort will improve and/or be controlled Outcome: Progressing   Problem: Safety: Goal: Ability to remain free from injury will improve Outcome: Progressing   Problem: Skin Integrity: Goal: Risk for impaired  skin integrity will decrease Outcome: Progressing   Problem: Activity: Goal: Ability to tolerate increased activity will improve Outcome: Progressing   Problem: Clinical Measurements: Goal: Ability to maintain a body temperature in the normal range will improve Outcome: Progressing   Problem: Respiratory: Goal: Ability to maintain adequate ventilation will improve Outcome: Progressing   Problem: Metabolic: Goal: Ability to maintain appropriate glucose levels will improve Outcome: Adequate for Discharge   Problem: Clinical Measurements: Goal: Cardiovascular complication will be avoided Outcome: Adequate for Discharge   Problem: Elimination: Goal: Will not experience complications related to bowel motility Outcome: Adequate for Discharge Goal: Will not experience complications related to urinary retention Outcome: Adequate for Discharge

## 2024-05-01 ENCOUNTER — Inpatient Hospital Stay

## 2024-05-01 ENCOUNTER — Ambulatory Visit: Admitting: Physician Assistant

## 2024-05-01 DIAGNOSIS — J189 Pneumonia, unspecified organism: Secondary | ICD-10-CM | POA: Diagnosis not present

## 2024-05-01 LAB — GLUCOSE, CAPILLARY
Glucose-Capillary: 96 mg/dL (ref 70–99)
Glucose-Capillary: 89 mg/dL (ref 70–99)
Glucose-Capillary: 106 mg/dL — ABNORMAL HIGH (ref 70–99)
Glucose-Capillary: 107 mg/dL — ABNORMAL HIGH (ref 70–99)

## 2024-05-01 LAB — BASIC METABOLIC PANEL WITH GFR
Anion gap: 8 (ref 5–15)
BUN: 26 mg/dL — ABNORMAL HIGH (ref 8–23)
CO2: 24 mmol/L (ref 22–32)
Calcium: 8.6 mg/dL — ABNORMAL LOW (ref 8.9–10.3)
Chloride: 105 mmol/L (ref 98–111)
Creatinine, Ser: 0.98 mg/dL (ref 0.44–1.00)
GFR, Estimated: >60 mL/min (ref >60)
Glucose, Bld: 102 mg/dL — ABNORMAL HIGH (ref 70–99)
Potassium: 3.6 mmol/L (ref 3.5–5.1)
Sodium: 137 mmol/L (ref 135–145)

## 2024-05-01 LAB — CULTURE, BLOOD (ROUTINE X 2)
Culture: NO GROWTH
Culture: NO GROWTH
Special Requests: ADEQUATE

## 2024-05-01 LAB — MAGNESIUM: Magnesium: 1.6 mg/dL — ABNORMAL LOW (ref 1.7–2.4)

## 2024-05-01 MED ORDER — SENNOSIDES-DOCUSATE SODIUM 8.6-50 MG PO TABS
1.0000 | ORAL_TABLET | Freq: Two times a day (BID) | ORAL | Status: DC
  Administered 2024-05-01: 1 via ORAL
  Filled 2024-05-01: qty 1

## 2024-05-01 MED ORDER — OLANZAPINE 2.5 MG PO TABS: 2.5000 mg | ORAL_TABLET | Freq: Every day | ORAL | 0 refills | Status: DC

## 2024-05-01 MED ORDER — OLANZAPINE 5 MG PO TABS: 2.5000 mg | ORAL_TABLET | Freq: Every day | ORAL | Status: DC

## 2024-05-01 MED ORDER — POLYETHYLENE GLYCOL 3350 17 G PO PACK: 17.0000 g | PACK | Freq: Every day | ORAL | 0 refills | Status: DC

## 2024-05-01 MED ORDER — CEFDINIR 300 MG PO CAPS: 300.0000 mg | ORAL_CAPSULE | Freq: Two times a day (BID) | ORAL | 0 refills | Status: AC

## 2024-05-01 MED ORDER — OXYCODONE HCL 5 MG PO TABS: 5.0000 mg | ORAL_TABLET | ORAL | 0 refills | Status: DC | PRN

## 2024-05-01 MED ORDER — CEFDINIR 300 MG PO CAPS: 300.0000 mg | ORAL_CAPSULE | Freq: Two times a day (BID) | ORAL | 0 refills | Status: DC

## 2024-05-01 MED ORDER — GUAIFENESIN ER 600 MG PO TB12: 600.0000 mg | ORAL_TABLET | Freq: Two times a day (BID) | ORAL | 0 refills | Status: DC

## 2024-05-01 MED ORDER — OXYCODONE HCL 5 MG PO TABS: 5.0000 mg | ORAL_TABLET | ORAL | 0 refills | Status: AC | PRN

## 2024-05-01 MED ORDER — DOXYCYCLINE HYCLATE 100 MG PO TABS: 100.0000 mg | ORAL_TABLET | Freq: Two times a day (BID) | ORAL | 0 refills | Status: AC

## 2024-05-01 MED ORDER — IPRATROPIUM-ALBUTEROL 0.5-2.5 (3) MG/3ML IN SOLN: 3.0000 mL | Freq: Three times a day (TID) | RESPIRATORY_TRACT | 0 refills | Status: DC

## 2024-05-01 MED ORDER — LEVETIRACETAM 1000 MG PO TABS: 1000.0000 mg | ORAL_TABLET | Freq: Two times a day (BID) | ORAL | 2 refills | Status: DC

## 2024-05-01 MED ORDER — ALPRAZOLAM 0.25 MG PO TABS: 0.2500 mg | ORAL_TABLET | Freq: Two times a day (BID) | ORAL | 0 refills | Status: DC | PRN

## 2024-05-01 MED ORDER — GUAIFENESIN ER 600 MG PO TB12: 600.0000 mg | ORAL_TABLET | Freq: Two times a day (BID) | ORAL | 0 refills | Status: AC

## 2024-05-01 MED ORDER — FUROSEMIDE 20 MG PO TABS: 20.0000 mg | ORAL_TABLET | ORAL | Status: DC

## 2024-05-01 MED ORDER — MAGNESIUM SULFATE 2 GM/50ML IV SOLN
2.0000 g | Freq: Once | INTRAVENOUS | Status: AC
  Administered 2024-05-01: 2 g via INTRAVENOUS
  Filled 2024-05-01: qty 50

## 2024-05-01 MED ORDER — DOXYCYCLINE HYCLATE 100 MG PO TABS: 100.0000 mg | ORAL_TABLET | Freq: Two times a day (BID) | ORAL | 0 refills | Status: DC

## 2024-05-01 MED ORDER — FUROSEMIDE 20 MG PO TABS
20.0000 mg | ORAL_TABLET | ORAL | Status: DC
Start: 2024-05-01 — End: 2024-05-02
  Administered 2024-05-01: 20 mg via ORAL
  Filled 2024-05-01: qty 1

## 2024-05-01 MED ORDER — MAGNESIUM OXIDE -MG SUPPLEMENT 400 (240 MG) MG PO TABS
200.0000 mg | ORAL_TABLET | Freq: Every day | ORAL | Status: DC
  Administered 2024-05-01: 200 mg via ORAL
  Filled 2024-05-01: qty 1

## 2024-05-01 NOTE — TOC Transition Note (Signed)
 Transition of Care [facility]) - Discharge Note   Patient Details  Name: Virginia [name] MRN: [id number] Date of Birth: September 08, 1956  Transition of Care [facility]) CM/SW Contact:  Virginia CHRISTELLA Eva, LCSW Phone Number: 05/01/2024, 1:24 PM   Clinical Narrative:     Pt's PASRR [id number] A was assigned. CSW spoke with pt's husband he has chosen [facility]. Pt to d/c today to [facility] , pt's room number 3218 RN to call report to [phone]. PTAR called , Care management sign off.  Final next level of care: Skilled Nursing Facility Barriers to Discharge: Barriers Resolved   Patient Goals and CMS Choice Patient states their goals for this hospitalization and ongoing recovery are:: pt to d/c to SNF to get stonger CMS Medicare.gov Compare Post Acute Care list provided to:: Patient Choice offered to / list presented to : Patient      Discharge Placement PASRR number recieved: 04/30/24            Patient chooses bed at: [facility] Patient to be transferred to facility by: ems Name of family member notified: Aanyah, Loa (Spouse)  [phone] (Mobile) Patient and family notified of of transfer: 05/01/24  Discharge Plan and Services Additional resources added to the After Visit Summary for                                       Social Drivers of Health (SDOH) Interventions SDOH Screenings   Food Insecurity: No Food Insecurity (04/27/2024)  Housing: Low Risk  (04/27/2024)  Transportation Needs: No Transportation Needs (04/27/2024)  Utilities: Not At Risk (04/27/2024)  Depression (PHQ2-9): Low Risk  (04/03/2024)  Social Connections: Moderately Isolated (04/27/2024)  Tobacco Use: Medium Risk (04/26/2024)     Readmission Risk Interventions    12/11/2023   11:41 AM 06/06/2023    3:51 PM 05/07/2023    1:35 PM  Readmission Risk Prevention Plan  Transportation Screening Complete Complete Complete  PCP or Specialist Appt within 3-5 Days   Complete  HRI or Home  Care Consult   Complete  Social Work Consult for Recovery Care Planning/Counseling   Complete  Palliative Care Screening   Not Applicable  Medication Review Oceanographer) Complete Complete Complete  HRI or Home Care Consult Complete Complete   SW Recovery Care/Counseling Consult Complete Complete   Palliative Care Screening -- Complete   Skilled Nursing Facility Not Applicable Not Applicable

## 2024-05-01 NOTE — Discharge Summary (Signed)
 Physician Discharge Summary   Patient: Virginia [name] MRN: [id number] DOB: [birth year]  Admit date:     04/26/2024  Discharge date: 05/01/24  Discharge Physician: Owen DELENA Lore   PCP: Perri Ronal PARAS, MD   Recommendations at discharge:    Follow up on resolution of PNA She will need 3 L oxygen at HS, 2 L during day. She will need nocturnal oxygen at discharge form SNF   Discharge Diagnoses: Principal Problem:   Multifocal pneumonia Active Problems:   Acute hypoxemic respiratory failure (HCC)   Malignant neoplasm metastatic to brain [facility])   UTI (urinary tract infection)   Rhinovirus infection   Physical deconditioning   Hypokalemia   Pericardial effusion without cardiac tamponade   Heart failure with reduced ejection fraction (HCC)   Chronic pain   Type 2 diabetes mellitus with complication, without long-term current use of insulin  (HCC)   Anxiety   Hyperlipidemia   Hypertension   COPD (chronic obstructive pulmonary disease) (HCC)   Hypomagnesemia   GERD (gastroesophageal reflux disease)  Resolved Problems:   * No resolved hospital problems. *  Hospital Course: 68 year old with past medical history significant for metastatic lung cancer brain mets s/p radiation March 2021 and craniotomy with resection of cerebellar mass March 2022 currently on immunotherapy, history of DVT March 2021 and PE 2024 no longer on anticoagulation given history of brain mets with hemorrhagic conversion, seizure disorder, chronic pain on opioids, chronic constipation, hypertension, hyperlipidemia, anemia, GERD, COPD, pericardial effusion status post pericardiocentesis April 2025, CHF, NICM, history of V-fib arrest, diabetes diet-controlled, chronic left-sided hemiparesis since craniotomy presented with cough, sore throat, shortness of breath, admitted with diagnosis of pneumonia. CT chest abdomen showed multifocal pneumonia with stable large pericardial effusion.   Assessment and  Plan: 1-Multifocal pneumonia: Present with shortness of breath, CT chest with finding for multifocal pneumonia Treated with IV antibiotics to cover for superimposed bacterial infection -Had more cough last night. Oxygen 91 RA, placed on 2 L.  Continue with antibiotics, plan to treat for 7 days. Discharge on Doxy and cefdinir  for 3 days.  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    Acute hypoxic respiratory failure In setting of pneumonia Placed on 2 L oxygen.  Nocturnal pulse oxymetry; positive, will need oxygen at HS.    Hypokalemia: Replaced.    Physical deconditioning -Plan for rehab   Rhinovirus infection -Supportive care   UTI: Urine culture no growth.  Rocephin  should cover for both UTI and pneumonia   Pericardial effusion without cardiac tamponade - CT showing stable large pericardial effusion.  No signs of cardiac tamponade.  She is seen by outpatient cardiology and being treated with colchicine  and diuretics.  Continue home meds.    Hypokalemia - Replete as needed   Heart failure with reduced ejection fraction (HCC) Stable, continue home meds.  Last echo done in March 2025 showing EF 50 to 55%, grade 1 diastolic dysfunction, large pericardial effusion, mild aortic regurgitation.    Chronic pain Placed on OxyContin  15 mg every 12 hours which is hospital formulary replacement for her home Xtampza .  Continue Oxy IR 5 mg every 4 hours as needed.    Type 2 diabetes mellitus with complication, without long-term current use of insulin  [facility]) Not on meds. A1c 7.3 in September 2024, repeat ordered. Placed on sensitive sliding scale insulin  as she is on chronic steroids.    GERD (gastroesophageal reflux disease) - Continue Protonix    Hypomagnesemia - Replete as needed   COPD (chronic obstructive pulmonary disease) (HCC)  No S/S exacerbation - Continue DuoNebs as needed  -On  schedule nebulizer.    Hypertension - Continue Lasix    Anxiety Continue Xanax  PRN.           Consultants: None Procedures performed: None Disposition: Skilled nursing facility Diet recommendation:  Discharge Diet Orders (From admission, onward)     Start     Ordered   05/01/24 0000  Diet - low sodium heart healthy        05/01/24 1305           Carb modified diet DISCHARGE MEDICATION: Allergies as of 05/01/2024       Reactions   Scopolamine  Rash        Medication List     STOP taking these medications    azithromycin  250 MG tablet Commonly known as: ZITHROMAX    magnesium  oxide 400 (240 Mg) MG tablet Commonly known as: MAG-OX   potassium chloride  SA 20 MEQ tablet Commonly known as: KLOR-CON  M   spironolactone  25 MG tablet Commonly known as: ALDACTONE        TAKE these medications    (feeding supplement) PROSource Plus liquid Take 30 mLs by mouth 2 (two) times daily between meals. What changed:  when to take this additional instructions   acetaminophen  325 MG tablet Commonly known as: TYLENOL  Take 1-2 tablets (325-650 mg total) by mouth every 4 (four) hours as needed for mild pain (pain score 1-3). What changed: how much to take   albuterol  108 (90 Base) MCG/ACT inhaler Commonly known as: VENTOLIN  HFA INHALE 2 PUFFS INTO THE LUNGS EVERY 6 HOURS AS NEEDED FOR WHEEZING OR SHORTNESS OF BREATH   ALPRAZolam  0.25 MG tablet Commonly known as: XANAX  Take 1 tablet (0.25 mg total) by mouth 2 (two) times daily as needed for anxiety.   cefdinir  300 MG capsule Commonly known as: OMNICEF  Take 1 capsule (300 mg total) by mouth 2 (two) times daily for 4 days.   colchicine  0.6 MG tablet Take 1 tablet (0.6 mg total) by mouth daily.   dexamethasone  1 MG tablet Commonly known as: DECADRON  Take 1 tablet (1 mg total) by mouth daily. What changed: how much to take   doxycycline  100 MG tablet Commonly known as: VIBRA -TABS Take 1 tablet (100 mg total) by mouth every 12 (twelve) hours for 3 days.   furosemide  20 MG tablet Commonly known as:  LASIX  Take 1 tablet (20 mg total) by mouth 2 (two) times a week. Tues & Thurs Start taking on: May 04, 2024 What changed:  when to take this additional instructions   guaiFENesin  600 MG 12 hr tablet Commonly known as: MUCINEX  Take 1 tablet (600 mg total) by mouth 2 (two) times daily for 5 days.   hydrocortisone  2.5 % rectal cream Commonly known as: ANUSOL -HC use in rectal area 2 to 3 times a day as needed   ipratropium-albuterol  0.5-2.5 (3) MG/3ML Soln Commonly known as: DUONEB Take 3 mLs by nebulization 3 (three) times daily.   levETIRAcetam  1000 MG tablet Commonly known as: KEPPRA  Take 1 tablet (1,000 mg total) by mouth 2 (two) times daily.   Magnesium  Glycinate 100 MG Caps Take 1 capsule by mouth in the morning and at bedtime.   meclizine  50 MG tablet Commonly known as: ANTIVERT  Take 1 tablet (50 mg total) by mouth 3 (three) times daily. What changed:  how much to take additional instructions   metoprolol  succinate 25 MG 24 hr tablet Commonly known as: TOPROL -XL Take 1 tablet (25 mg total) by  mouth daily.   multivitamin with minerals Tabs tablet Take 2 tablets by mouth daily. Gummy   OLANZapine  2.5 MG tablet Commonly known as: ZYPREXA  Take 1 tablet (2.5 mg total) by mouth at bedtime.   ondansetron  4 MG tablet Commonly known as: ZOFRAN  TAKE 1 TABLET(4 MG) BY MOUTH EVERY 8 HOURS AS NEEDED FOR NAUSEA OR VOMITING   oxyCODONE  5 MG immediate release tablet Commonly known as: Oxy IR/ROXICODONE  Take 1 tablet (5 mg total) by mouth every 4 (four) hours as needed for up to 3 days for severe pain (pain score 7-10).   pantoprazole  40 MG tablet Commonly known as: PROTONIX  TAKE 1 TABLET(40 MG) BY MOUTH DAILY   polyethylene glycol 17 g packet Commonly known as: MIRALAX  / GLYCOLAX  Take 17 g by mouth daily. Start taking on: May 02, 2024 What changed:  when to take this reasons to take this   senna-docusate 8.6-50 MG tablet Commonly known as: Senokot-S Take 2  tablets by mouth daily as needed (Patient preference, Sennakot S for mild constipation). What changed:  reasons to take this additional instructions   VITAMIN B12 PO Take 5,000 mcg by mouth daily. Gummies   Vitamin D3 125 MCG (5000 UT) Tabs Take 5,000 Units by mouth every evening.   Xtampza  ER 13.5 MG C12a Generic drug: oxyCODONE  ER Take 1 capsule by mouth in the morning and at bedtime.        Discharge Exam: Filed Weights   04/26/24 1623 04/27/24 0301  Weight: 84 kg 85.2 kg   General; Alert, in no distress  Condition at discharge: stable  The results of significant diagnostics from this hospitalization (including imaging, microbiology, ancillary and laboratory) are listed below for reference.   Imaging Studies: CT CHEST ABDOMEN PELVIS W CONTRAST Result Date: 04/26/2024 CLINICAL DATA:  Sepsis, cough, generalized weakness, diarrhea, history of lung cancer metastasized to the brain. Unable to keep fluids down EXAM: CT CHEST, ABDOMEN, AND PELVIS WITH CONTRAST TECHNIQUE: Multidetector CT imaging of the chest, abdomen and pelvis was performed following the standard protocol during bolus administration of intravenous contrast. RADIATION DOSE REDUCTION: This exam was performed according to the departmental dose-optimization program which includes automated exposure control, adjustment of the mA and/or kV according to patient size and/or use of iterative reconstruction technique. CONTRAST:  OMNIPAQUE  IOHEXOL  300 MG/ML  SOLN COMPARISON:  Same day chest radiograph and CT 01/27/2024 FINDINGS: CT CHEST FINDINGS Cardiovascular: Similar large pericardial effusion. Coronary artery and aortic atherosclerotic calcification. Mediastinum/Nodes: Trachea and esophagus are unremarkable. No pathologic adenopathy. Lungs/Pleura: Lingular and bilateral lower lobe consolidation with low-density fluid. Numerous scattered centrilobular ground-glass nodules are likely infectious/inflammatory and have  progressed since 01/27/2024 trace right pleural effusion. No pneumothorax Musculoskeletal: No acute fracture. Chronic T8 compression fracture status post vertebroplasty. CT ABDOMEN PELVIS FINDINGS Hepatobiliary: Cholecystectomy. Unremarkable liver and biliary tree. Pancreas: Unremarkable. Spleen: Unremarkable. Adrenals/Urinary Tract: Normal adrenal glands. No urinary calculi or hydronephrosis. Unremarkable bladder. Stomach/Bowel: Normal caliber large and small bowel. No bowel wall thickening. Normal appendix. Stomach is within normal limits. Vascular/Lymphatic: Advanced aortic atherosclerotic calcification. No pathologic adenopathy. Reproductive: Hysterectomy.  No adnexal mass. Other: No free intraperitoneal fluid or air. Musculoskeletal: No acute fracture. IMPRESSION: 1. Multifocal pneumonia. 2. Similar large pericardial effusion. 3. No acute abnormality in the abdomen or pelvis. 4. Aortic Atherosclerosis (ICD10-I70.0). Electronically Signed   By: Norman Gatlin M.D.   On: 04/26/2024 20:40   DG Chest Port 1 View Result Date: 04/26/2024 CLINICAL DATA:  Questionable sepsis. EXAM: PORTABLE CHEST 1 VIEW COMPARISON:  08/26/2023 , CT 01/27/2019 FINDINGS: Stable large cardiac silhouette. LEFT lobe atelectasis. RIGHT lung clear. No effusion or infiltrate. IMPRESSION: 1. Persistent enlarged cardiac silhouette. Potential pericardial effusion. Pericardial effusion on comparison CT 2. LEFT lobe atelectasis. Electronically Signed   By: Jackquline Boxer M.D.   On: 04/26/2024 17:26    Microbiology: Results for orders placed or performed during the hospital encounter of 04/26/24  Blood culture (routine x 2)     Status: None   Collection Time: 04/26/24  4:53 PM   Specimen: BLOOD LEFT FOREARM  Result Value Ref Range Status   Specimen Description   Final    BLOOD LEFT FOREARM Performed at [facility], [address], [place], [place] [address]    Special Requests   Final    BOTTLES DRAWN AEROBIC AND ANAEROBIC  Blood Culture adequate volume Performed at [facility], [address], [place], [place] [address]    Culture   Final    NO GROWTH 5 DAYS Performed at [facility], [address] [place], [place] [address]    Report Status 05/01/2024 FINAL  Final  Resp panel by RT-PCR (RSV, Flu A&B, Covid) Anterior Nasal Swab     Status: None   Collection Time: 04/26/24  4:53 PM   Specimen: Anterior Nasal Swab  Result Value Ref Range Status   SARS Coronavirus 2 by RT PCR NEGATIVE NEGATIVE Final    Comment: (NOTE) SARS-CoV-2 target nucleic acids are NOT DETECTED.  The SARS-CoV-2 RNA is generally detectable in upper respiratory specimens during the acute phase of infection. The lowest concentration of SARS-CoV-2 viral copies this assay can detect is 138 copies/mL. A negative result does not preclude SARS-Cov-2 infection and should not be used as the sole basis for treatment or other patient management decisions. A negative result may occur with  improper specimen collection/handling, submission of specimen other than nasopharyngeal swab, presence of viral mutation(s) within the areas targeted by this assay, and inadequate number of viral copies(<138 copies/mL). A negative result must be combined with clinical observations, patient history, and epidemiological information. The expected result is Negative.  Fact Sheet for Patients:  BloggerCourse.com  Fact Sheet for Healthcare Providers:  SeriousBroker.it  This test is no t yet approved or cleared by the [place]  FDA and  has been authorized for detection and/or diagnosis of SARS-CoV-2 by FDA under an Emergency Use Authorization (EUA). This EUA will remain  in effect (meaning this test can be used) for the duration of the COVID-19 declaration under Section 564(b)(1) of the Act, 21 U.S.C.section 360bbb-3(b)(1), unless the authorization is terminated  or revoked  sooner.       Influenza A by PCR NEGATIVE NEGATIVE Final   Influenza B by PCR NEGATIVE NEGATIVE Final    Comment: (NOTE) The Xpert Xpress SARS-CoV-2/FLU/RSV plus assay is intended as an aid in the diagnosis of influenza from Nasopharyngeal swab specimens and should not be used as a sole basis for treatment. Nasal washings and aspirates are unacceptable for Xpert Xpress SARS-CoV-2/FLU/RSV testing.  Fact Sheet for Patients: BloggerCourse.com  Fact Sheet for Healthcare Providers: SeriousBroker.it  This test is not yet approved or cleared by the [place]  FDA and has been authorized for detection and/or diagnosis of SARS-CoV-2 by FDA under an Emergency Use Authorization (EUA). This EUA will remain in effect (meaning this test can be used) for the duration of the COVID-19 declaration under Section 564(b)(1) of the Act, 21 U.S.C. section 360bbb-3(b)(1), unless the authorization is  terminated or revoked.     Resp Syncytial Virus by PCR NEGATIVE NEGATIVE Final    Comment: (NOTE) Fact Sheet for Patients: BloggerCourse.com  Fact Sheet for Healthcare Providers: SeriousBroker.it  This test is not yet approved or cleared by the [place]  FDA and has been authorized for detection and/or diagnosis of SARS-CoV-2 by FDA under an Emergency Use Authorization (EUA). This EUA will remain in effect (meaning this test can be used) for the duration of the COVID-19 declaration under Section 564(b)(1) of the Act, 21 U.S.C. section 360bbb-3(b)(1), unless the authorization is terminated or revoked.  Performed at [facility], [address] [place], [place] [address]   Blood culture (routine x 2)     Status: None   Collection Time: 04/26/24  7:10 PM   Specimen: BLOOD  Result Value Ref Range Status   Specimen Description   Final    BLOOD BLOOD LEFT HAND Performed at  [facility], [address], [place], [place] [address]    Special Requests   Final    BOTTLES DRAWN AEROBIC ONLY Blood Culture results may not be optimal due to an inadequate volume of blood received in culture bottles Performed at [facility], [address] [place], [place] [address]    Culture   Final    NO GROWTH 5 DAYS Performed at [facility], [address], [place], [place] [address]    Report Status 05/01/2024 FINAL  Final  Urine Culture     Status: None   Collection Time: 04/26/24  9:46 PM   Specimen: Urine, Random  Result Value Ref Range Status   Specimen Description URINE, RANDOM  Final   Special Requests NONE Reflexed from [id number]  Final   Culture   Final    NO GROWTH Performed at [facility], [address], [place], [place] [address]    Report Status 04/28/2024 FINAL  Final  Respiratory (~20 pathogens) panel by PCR     Status: Abnormal   Collection Time: 04/27/24  3:35 AM   Specimen: Nasopharyngeal Swab; Respiratory  Result Value Ref Range Status   Adenovirus NOT DETECTED NOT DETECTED Final   Coronavirus 229E NOT DETECTED NOT DETECTED Final    Comment: (NOTE) The Coronavirus on the Respiratory Panel, DOES NOT test for the novel  Coronavirus (2019 nCoV)    Coronavirus HKU1 NOT DETECTED NOT DETECTED Final   Coronavirus NL63 NOT DETECTED NOT DETECTED Final   Coronavirus OC43 NOT DETECTED NOT DETECTED Final   Metapneumovirus NOT DETECTED NOT DETECTED Final   Rhinovirus / Enterovirus DETECTED (A) NOT DETECTED Final   Influenza A NOT DETECTED NOT DETECTED Final   Influenza B NOT DETECTED NOT DETECTED Final   Parainfluenza Virus 1 NOT DETECTED NOT DETECTED Final   Parainfluenza Virus 2 NOT DETECTED NOT DETECTED Final   Parainfluenza Virus 3 NOT DETECTED NOT DETECTED Final   Parainfluenza Virus 4 NOT DETECTED NOT DETECTED Final   Respiratory Syncytial Virus NOT DETECTED NOT DETECTED Final   Bordetella pertussis  NOT DETECTED NOT DETECTED Final   Bordetella Parapertussis NOT DETECTED NOT DETECTED Final   Chlamydophila pneumoniae NOT DETECTED NOT DETECTED Final   Mycoplasma pneumoniae NOT DETECTED NOT DETECTED Final    Comment: Performed at [facility], [address] [place], [place] [address]   *Note: Due to a large number of results and/or encounters for the requested time period, some results have not been displayed. A complete set of  results can be found in Results Review.    Labs: CBC: Recent Labs  Lab 04/26/24 1633  WBC 9.1  NEUTROABS 5.3  HGB 12.8  HCT 38.4  MCV 96.5  PLT 214   Basic Metabolic Panel: Recent Labs  Lab 04/26/24 1903 04/27/24 0349 04/29/24 1053 04/30/24 0431 05/01/24 0405  NA 142 139 143 141 137  K 3.0* 4.2 2.8* 3.9 3.6  CL 108 105 108 111 105  CO2 20* 21* 24 23 24   GLUCOSE 97 149* 112* 895* 102*  BUN 19 21 24* 25* 26*  CREATININE 0.86 0.98 1.09* 0.87 0.98  CALCIUM  7.3* 8.6* 8.4* 8.2* 8.6*  MG 1.6* 2.1  --   --  1.6*   Liver Function Tests: Recent Labs  Lab 04/26/24 1903  AST 14*  ALT 13  ALKPHOS 39  BILITOT 0.9  PROT 5.0*  ALBUMIN 2.2*   CBG: Recent Labs  Lab 04/30/24 1727 04/30/24 2209 05/01/24 0726 05/01/24 1106 05/01/24 1153  GLUCAP 120* 111* 96 89 106*    Discharge time spent: greater than 30 minutes.  Signed: Owen DELENA Lore, MD [facility] 05/01/2024

## 2024-05-01 NOTE — TOC Progression Note (Signed)
 Transition of Care [facility]) - Progression Note    Patient Details  Name: Virginia [name] MRN: [id number] Date of Birth: Jan 07, 1956  [facility]) CM/SW Contact  Tawni CHRISTELLA Eva, LCSW Phone Number: 05/01/2024, 10:00 AM  Clinical Narrative:     CSW spoke with the pt's husband to discuss SNF choice. He stated they are still reviewing facilities. He mentioned he has two to visit today and will make a decision afterward. CSW informed the pt that she is stable for discharge and a decision will need to be made today. The pt's spouse stated he will call CSW back. Care management to follow  Expected Discharge Plan: Skilled Nursing Facility Barriers to Discharge: Continued Medical Work up               Expected Discharge Plan and Services       Living arrangements for the past 2 months: Single Family Home                                       Social Drivers of Health (SDOH) Interventions SDOH Screenings   Food Insecurity: No Food Insecurity (04/27/2024)  Housing: Low Risk  (04/27/2024)  Transportation Needs: No Transportation Needs (04/27/2024)  Utilities: Not At Risk (04/27/2024)  Depression (PHQ2-9): Low Risk  (04/03/2024)  Social Connections: Moderately Isolated (04/27/2024)  Tobacco Use: Medium Risk (04/26/2024)    Readmission Risk Interventions    12/11/2023   11:41 AM 06/06/2023    3:51 PM 05/07/2023    1:35 PM  Readmission Risk Prevention Plan  Transportation Screening Complete Complete Complete  PCP or Specialist Appt within 3-5 Days   Complete  HRI or Home Care Consult   Complete  Social Work Consult for Recovery Care Planning/Counseling   Complete  Palliative Care Screening   Not Applicable  Medication Review Oceanographer) Complete Complete Complete  HRI or Home Care Consult Complete Complete   SW Recovery Care/Counseling Consult Complete Complete   Palliative Care Screening -- Complete   Skilled Nursing Facility Not Applicable Not  Applicable

## 2024-05-15 ENCOUNTER — Inpatient Hospital Stay

## 2024-05-15 ENCOUNTER — Inpatient Hospital Stay: Admitting: Hematology and Oncology

## 2024-05-20 ENCOUNTER — Inpatient Hospital Stay: Attending: Hematology and Oncology | Admitting: Nurse Practitioner

## 2024-05-20 DIAGNOSIS — G939 Disorder of brain, unspecified: Secondary | ICD-10-CM | POA: Insufficient documentation

## 2024-05-20 DIAGNOSIS — G893 Neoplasm related pain (acute) (chronic): Secondary | ICD-10-CM

## 2024-05-20 DIAGNOSIS — C3490 Malignant neoplasm of unspecified part of unspecified bronchus or lung: Secondary | ICD-10-CM

## 2024-05-20 DIAGNOSIS — A419 Sepsis, unspecified organism: Secondary | ICD-10-CM | POA: Insufficient documentation

## 2024-05-20 DIAGNOSIS — J189 Pneumonia, unspecified organism: Secondary | ICD-10-CM | POA: Insufficient documentation

## 2024-05-20 DIAGNOSIS — E876 Hypokalemia: Secondary | ICD-10-CM | POA: Insufficient documentation

## 2024-05-20 DIAGNOSIS — S062XAA Diffuse traumatic brain injury with loss of consciousness status unknown, initial encounter: Secondary | ICD-10-CM | POA: Insufficient documentation

## 2024-05-20 DIAGNOSIS — Z8673 Personal history of transient ischemic attack (TIA), and cerebral infarction without residual deficits: Secondary | ICD-10-CM | POA: Insufficient documentation

## 2024-05-20 DIAGNOSIS — J90 Pleural effusion, not elsewhere classified: Secondary | ICD-10-CM | POA: Insufficient documentation

## 2024-05-20 DIAGNOSIS — R530 Neoplastic (malignant) related fatigue: Secondary | ICD-10-CM | POA: Diagnosis not present

## 2024-05-20 DIAGNOSIS — R1310 Dysphagia, unspecified: Secondary | ICD-10-CM | POA: Insufficient documentation

## 2024-05-20 DIAGNOSIS — I700 Atherosclerosis of aorta: Secondary | ICD-10-CM | POA: Insufficient documentation

## 2024-05-20 DIAGNOSIS — Z9049 Acquired absence of other specified parts of digestive tract: Secondary | ICD-10-CM | POA: Insufficient documentation

## 2024-05-20 DIAGNOSIS — I119 Hypertensive heart disease without heart failure: Secondary | ICD-10-CM | POA: Insufficient documentation

## 2024-05-20 DIAGNOSIS — Z79899 Other long term (current) drug therapy: Secondary | ICD-10-CM | POA: Insufficient documentation

## 2024-05-20 DIAGNOSIS — Z90710 Acquired absence of both cervix and uterus: Secondary | ICD-10-CM | POA: Insufficient documentation

## 2024-05-20 DIAGNOSIS — M4854XA Collapsed vertebra, not elsewhere classified, thoracic region, initial encounter for fracture: Secondary | ICD-10-CM | POA: Insufficient documentation

## 2024-05-20 DIAGNOSIS — I3139 Other pericardial effusion (noninflammatory): Secondary | ICD-10-CM | POA: Insufficient documentation

## 2024-05-20 DIAGNOSIS — T66XXXA Radiation sickness, unspecified, initial encounter: Secondary | ICD-10-CM | POA: Insufficient documentation

## 2024-05-20 DIAGNOSIS — G8929 Other chronic pain: Secondary | ICD-10-CM | POA: Insufficient documentation

## 2024-05-20 DIAGNOSIS — Z7901 Long term (current) use of anticoagulants: Secondary | ICD-10-CM | POA: Insufficient documentation

## 2024-05-20 DIAGNOSIS — I2510 Atherosclerotic heart disease of native coronary artery without angina pectoris: Secondary | ICD-10-CM | POA: Insufficient documentation

## 2024-05-20 DIAGNOSIS — M899 Disorder of bone, unspecified: Secondary | ICD-10-CM | POA: Insufficient documentation

## 2024-05-20 DIAGNOSIS — C7931 Secondary malignant neoplasm of brain: Secondary | ICD-10-CM | POA: Insufficient documentation

## 2024-05-20 DIAGNOSIS — Z79891 Long term (current) use of opiate analgesic: Secondary | ICD-10-CM | POA: Insufficient documentation

## 2024-05-20 DIAGNOSIS — K5900 Constipation, unspecified: Secondary | ICD-10-CM | POA: Insufficient documentation

## 2024-05-20 DIAGNOSIS — J9811 Atelectasis: Secondary | ICD-10-CM | POA: Insufficient documentation

## 2024-05-20 DIAGNOSIS — Z5112 Encounter for antineoplastic immunotherapy: Secondary | ICD-10-CM | POA: Insufficient documentation

## 2024-05-20 DIAGNOSIS — C7951 Secondary malignant neoplasm of bone: Secondary | ICD-10-CM

## 2024-05-20 DIAGNOSIS — Z515 Encounter for palliative care: Secondary | ICD-10-CM | POA: Diagnosis not present

## 2024-05-20 DIAGNOSIS — C3431 Malignant neoplasm of lower lobe, right bronchus or lung: Secondary | ICD-10-CM | POA: Insufficient documentation

## 2024-05-20 DIAGNOSIS — Z9981 Dependence on supplemental oxygen: Secondary | ICD-10-CM | POA: Insufficient documentation

## 2024-05-20 DIAGNOSIS — I82402 Acute embolism and thrombosis of unspecified deep veins of left lower extremity: Secondary | ICD-10-CM | POA: Insufficient documentation

## 2024-05-20 DIAGNOSIS — Z86718 Personal history of other venous thrombosis and embolism: Secondary | ICD-10-CM | POA: Insufficient documentation

## 2024-05-20 NOTE — Progress Notes (Signed)
 Palliative Medicine [facility]  Telephone:([phone]) [phone] Fax:(336) [phone]   Name: BRITTINEY DICOSTANZO Date: 05/20/2024 MRN: [id number]  DOB: 07/08/1956  Patient Care Team: Perri Ronal PARAS, MD as PCP - General (Internal Medicine) Federico Norleen ONEIDA MADISON, MD as PCP - Hematology/Oncology (Hematology and Oncology) Evertt Lonell BROCKS, RN as Oncology Nurse Navigator Marvine Almarie CROME, DO as Consulting Physician [facility] and Palliative Medicine) Lbcardiology, Rande, MD as Rounding Team (Cardiology) Pickenpack-Cousar, Fannie SAILOR, NP as Nurse Practitioner [facility] and Palliative Medicine)   I connected with [name] on 05/20/24 at 11:30 AM EDT by MyChart video and verified that I am speaking with the correct person using two identifiers.   I discussed the limitations, risks, security and privacy concerns of performing an evaluation and management service by telemedicine and the availability of in-person appointments. I also discussed with the patient that there may be a patient responsible charge related to this service. The patient expressed understanding and agreed to proceed.   Other persons participating in the visit and their role in the encounter: Husband    Patient's location: [facility] Provider's location: [facility]  INTERVAL HISTORY: CYPRESS HINKSON is a 68 y.o. female with oncologic medical history including non-small cell lung cancer (09/2019) with metastatic disease to the bone and brain. Palliative ask to see for symptom management and goals of care.   SOCIAL HISTORY:     reports that she quit smoking about 11 years ago. Her smoking use included cigarettes. She has never used smokeless tobacco. She reports current alcohol use. She reports that she does not use drugs.  ADVANCE DIRECTIVES:  Advanced directives on file naming Cammie Faulstich and Rosina Horns as the primary and secondary decision makers should the patient become unable to speak for  themselves.   CODE STATUS: Full code  PAST MEDICAL HISTORY: Past Medical History:  Diagnosis Date   Anemia    Anxiety    Concussion 09/28/2019   DVT (deep venous thrombosis) (HCC) 2021   L leg   Dyspnea    GERD (gastroesophageal reflux disease)    Hypercholesterolemia    per pt, she does not have elevated lipids   Hypertension    met lung ca dx'd 09/2019   mets to spine, hip and brain   PONV (postoperative nausea and vomiting)    Tobacco abuse     ALLERGIES:  is allergic to scopolamine .  MEDICATIONS:  Current Outpatient Medications  Medication Sig Dispense Refill   acetaminophen  (TYLENOL ) 325 MG tablet Take 1-2 tablets (325-650 mg total) by mouth every 4 (four) hours as needed for mild pain (pain score 1-3). (Patient taking differently: Take 500 mg by mouth every 4 (four) hours as needed for mild pain (pain score 1-3).)     albuterol  (VENTOLIN  HFA) 108 (90 Base) MCG/ACT inhaler INHALE 2 PUFFS INTO THE LUNGS EVERY 6 HOURS AS NEEDED FOR WHEEZING OR SHORTNESS OF BREATH 6.7 g 11   ALPRAZolam  (XANAX ) 0.25 MG tablet Take 1 tablet (0.25 mg total) by mouth 2 (two) times daily as needed for anxiety. 30 tablet 0   Cholecalciferol  (VITAMIN D3) 125 MCG (5000 UT) TABS Take 5,000 Units by mouth every evening.     colchicine  0.6 MG tablet Take 1 tablet (0.6 mg total) by mouth daily. 90 tablet 0   Cyanocobalamin  (VITAMIN B12 PO) Take 5,000 mcg by mouth daily. Gummies     dexamethasone  (DECADRON ) 1 MG tablet Take 1 tablet (1 mg total) by mouth daily. (Patient  taking differently: Take 0.5 mg by mouth daily.) 90 tablet 0   furosemide  (LASIX ) 20 MG tablet Take 1 tablet (20 mg total) by mouth 2 (two) times a week. Tues & Thurs     hydrocortisone  (ANUSOL -HC) 2.5 % rectal cream use in rectal area 2 to 3 times a day as needed 30 g PRN   ipratropium-albuterol  (DUONEB) 0.5-2.5 (3) MG/3ML SOLN Take 3 mLs by nebulization 3 (three) times daily. 360 mL 0   levETIRAcetam  (KEPPRA ) 1000 MG tablet Take 1 tablet  (1,000 mg total) by mouth 2 (two) times daily. 180 tablet 2   Magnesium  Glycinate 100 MG CAPS Take 1 capsule by mouth in the morning and at bedtime.     meclizine  (ANTIVERT ) 50 MG tablet Take 1 tablet (50 mg total) by mouth 3 (three) times daily. (Patient taking differently: Take 100 mg by mouth 3 (three) times daily. As needed) 90 tablet 2   metoprolol  succinate (TOPROL -XL) 25 MG 24 hr tablet Take 1 tablet (25 mg total) by mouth daily. 90 tablet 3   Multiple Vitamin (MULTIVITAMIN WITH MINERALS) TABS tablet Take 2 tablets by mouth daily. Gummy     Nutritional Supplements (,FEEDING SUPPLEMENT, PROSOURCE PLUS) liquid Take 30 mLs by mouth 2 (two) times daily between meals. (Patient taking differently: Take 30 mLs by mouth 4 (four) times daily. Berry flavor)     OLANZapine  (ZYPREXA ) 2.5 MG tablet Take 1 tablet (2.5 mg total) by mouth at bedtime. 90 tablet 0   ondansetron  (ZOFRAN ) 4 MG tablet TAKE 1 TABLET(4 MG) BY MOUTH EVERY 8 HOURS AS NEEDED FOR NAUSEA OR VOMITING 40 tablet 2   pantoprazole  (PROTONIX ) 40 MG tablet TAKE 1 TABLET(40 MG) BY MOUTH DAILY 90 tablet 3   polyethylene glycol (MIRALAX  / GLYCOLAX ) 17 g packet Take 17 g by mouth daily. 14 each 0   senna-docusate (SENOKOT-S) 8.6-50 MG tablet Take 2 tablets by mouth daily as needed (Patient preference, Sennakot S for mild constipation). (Patient taking differently: Take 2 tablets by mouth daily as needed for mild constipation. 2 tabs 2 x daily)     XTAMPZA  ER 13.5 MG C12A Take 1 capsule by mouth in the morning and at bedtime. 60 capsule 0   No current facility-administered medications for this visit.    VITAL SIGNS: There were no vitals taken for this visit. There were no vitals filed for this visit.   Estimated body mass index is 34.35 kg/m as calculated from the following:   Height as of 04/27/24: 5' 2 (1.575 m).   Weight as of 04/27/24: 187 lb 13.3 oz (85.2 kg).   PERFORMANCE STATUS (ECOG) : 1 - Symptomatic but completely  ambulatory  IMPRESSION: Discussed the use of AI scribe software for clinical note transcription with the patient, who gave verbal consent to proceed.  History of Present Illness ADENIKE SHIDLER is a 68 year old female who I connected with video for follow-up. Her husband engaged and present during discussions. No acute distress. She continues to do well. Her appetite is good. No concerns for nausea, vomiting, constipation, or diarrhea. Remains as active as possible. Patient recently hospitalized in discharged on August 22 to [facility] after receiving treatment for pneumonia and large pericardial effusion.  She was hospitalized for pneumonia and rhinovirus and discharged on the 22nd. Since then, she has been residing at a care facility for approximately two and a half weeks. During her stay, she has been on oxygen therapy, receiving 2 liters per minute during the day  and 1 liter per minute at night. The oxygen level has been mostly stable at 1.5 liters per minute as the machine settings are not frequently adjusted. She was without oxygen for a few hours due to neglect, but her oxygen saturation was around 94% when checked afterward.  Her pain management regimen includes 15 mg of extended-release morphine  every 12 hours, which was switched from Xtampza  due to availability issues. She also has oxycodone  for breakthrough pain, adjusted to every 4 hours as needed. Her caregiver closely monitors her medication administration to ensure accuracy.  Due to a cough noticed by a speech therapist, she has been placed on a thick liquid diet. Despite this, she continues to have coffee in the morning and water at night, as long as it does not cause coughing or choking.  She is undergoing physical therapy, occupational therapy, and speech therapy daily to regain strength and mobility. The goal is to improve her ability to transfer from bed to chair to toilet independently, as she was able to do  before her recent illness. She is working on building leg strength to facilitate these transfers.  Mrs. Gladwin is hopeful to return home soon.   All questions answered and support provided.   I discussed the importance of continued conversation with family and their medical providers regarding overall plan of care and treatment options, ensuring decisions are within the context of the patients values and GOCs.  Assessment & Plan Cancer under immunotherapy Cancer is well-controlled per patient.  - Continue Keytruda  therapy per Oncology.  - Avoid NSAIDs to prevent decreased efficacy.  Pneumonia and recent rhinovirus infection Recent hospitalization for pneumonia and rhinovirus infection. Currently well-managed with oxygen saturation around 94% after a period without supplemental oxygen. - Continue oxygen therapy at 2 L/min during the day and 1 L/min at night.  Oxygen dependence Oxygen dependence due to respiratory issues, currently well-managed on 1.5 L/min of oxygen. - Maintain current oxygen therapy settings.  Chronic pain requiring opioid therapy Chronic pain managed with opioid therapy. Currently on 15 mg extended-release morphine  every 12 hours. Oxycodone  for breakthrough pain adjusted to every 4 hours as needed, effectively managing pain. - Continue extended-release morphine  15 mg every 12 hours. - Administer oxycodone  for breakthrough pain every 4 hours as needed.  Dysphagia requiring thickened liquids Dysphagia managed with a thickened liquid diet to prevent aspiration and potential pneumonia. She is compliant with the diet but prefers regular coffee and water, which she consumes with caution to avoid coughing or choking. Speech therapist has advised that she can consume regular liquids with the warning of potential pneumonia risk. - Continue thickened liquid diet. - Allow regular coffee and water with caution, monitoring for any signs of aspiration.  Deconditioning with  impaired mobility Deconditioning and impaired mobility following hospitalization. Currently receiving daily physical therapy, occupational therapy, and speech therapy to improve strength and mobility. Goal is to regain ability to transfer independently between bed, chair, and toilet. - Continue daily physical therapy, occupational therapy, and speech therapy.  Pain management Chronic pain managed with Xtampza  extended release and breakthrough medication. Pain levels are 6-7 out of 10 during visit. She reports requiring breakthrough medication use as needed depending on level of activities for the day. Current regimen is manageable, though pain levels have recently increased. - Continue Xtampza  13.5mg  extended release every 12 hours. -Continue Oxycodone  every 4-6 hours as needed for breakthrough pain. - Monitor pain levels for consistency and severity. - Discuss potential adjustments to pain regimen if pain  becomes more consistent and severe.  Constipation Constipation managed with Senokot twice daily and Miralax  as needed. Regular use is necessary to prevent difficulty with bowel movements. - Continue Senokot twice daily. - Use Miralax  as needed for constipation.  I will plan to follow-up with patient in 4-6 weeks. Sooner if needed.    Patient expressed understanding and was in agreement with this plan. She also understands that She can call the clinic at any time with any questions, concerns, or complaints.   Any controlled substances utilized were prescribed in the context of palliative care. PDMP has been reviewed.   I provided 35 minutes of face-to-face video visit time during this encounter, and > 50% was spent counseling as documented under my assessment & plan. Visit consisted of counseling and education dealing with the complex and emotionally intense issues of symptom management and palliative care in the setting of serious and potentially life-threatening illness.  Levon Borer, AGPCNP-BC  Palliative Medicine Team/[facility]

## 2024-05-22 ENCOUNTER — Inpatient Hospital Stay (HOSPITAL_BASED_OUTPATIENT_CLINIC_OR_DEPARTMENT_OTHER): Admitting: Hematology and Oncology

## 2024-05-22 ENCOUNTER — Inpatient Hospital Stay

## 2024-05-22 VITALS — BP 116/70 | HR 84 | Temp 97.2°F | Resp 16 | Ht 62.0 in

## 2024-05-22 VITALS — BP 98/76 | HR 70 | Temp 98.4°F

## 2024-05-22 DIAGNOSIS — Z7901 Long term (current) use of anticoagulants: Secondary | ICD-10-CM | POA: Diagnosis not present

## 2024-05-22 DIAGNOSIS — G8929 Other chronic pain: Secondary | ICD-10-CM | POA: Diagnosis not present

## 2024-05-22 DIAGNOSIS — I82402 Acute embolism and thrombosis of unspecified deep veins of left lower extremity: Secondary | ICD-10-CM | POA: Diagnosis not present

## 2024-05-22 DIAGNOSIS — R918 Other nonspecific abnormal finding of lung field: Secondary | ICD-10-CM

## 2024-05-22 DIAGNOSIS — Z79899 Other long term (current) drug therapy: Secondary | ICD-10-CM | POA: Diagnosis not present

## 2024-05-22 DIAGNOSIS — A419 Sepsis, unspecified organism: Secondary | ICD-10-CM | POA: Diagnosis not present

## 2024-05-22 DIAGNOSIS — C7931 Secondary malignant neoplasm of brain: Secondary | ICD-10-CM | POA: Diagnosis not present

## 2024-05-22 DIAGNOSIS — S062XAA Diffuse traumatic brain injury with loss of consciousness status unknown, initial encounter: Secondary | ICD-10-CM | POA: Diagnosis not present

## 2024-05-22 DIAGNOSIS — E876 Hypokalemia: Secondary | ICD-10-CM | POA: Diagnosis not present

## 2024-05-22 DIAGNOSIS — J9811 Atelectasis: Secondary | ICD-10-CM | POA: Diagnosis not present

## 2024-05-22 DIAGNOSIS — C7951 Secondary malignant neoplasm of bone: Secondary | ICD-10-CM | POA: Diagnosis not present

## 2024-05-22 DIAGNOSIS — J90 Pleural effusion, not elsewhere classified: Secondary | ICD-10-CM | POA: Diagnosis not present

## 2024-05-22 DIAGNOSIS — I3139 Other pericardial effusion (noninflammatory): Secondary | ICD-10-CM | POA: Diagnosis not present

## 2024-05-22 DIAGNOSIS — Z79891 Long term (current) use of opiate analgesic: Secondary | ICD-10-CM | POA: Diagnosis not present

## 2024-05-22 DIAGNOSIS — C3490 Malignant neoplasm of unspecified part of unspecified bronchus or lung: Secondary | ICD-10-CM

## 2024-05-22 DIAGNOSIS — I2510 Atherosclerotic heart disease of native coronary artery without angina pectoris: Secondary | ICD-10-CM | POA: Diagnosis not present

## 2024-05-22 DIAGNOSIS — C3431 Malignant neoplasm of lower lobe, right bronchus or lung: Secondary | ICD-10-CM | POA: Diagnosis present

## 2024-05-22 DIAGNOSIS — Z5112 Encounter for antineoplastic immunotherapy: Secondary | ICD-10-CM | POA: Diagnosis present

## 2024-05-22 DIAGNOSIS — I700 Atherosclerosis of aorta: Secondary | ICD-10-CM | POA: Diagnosis not present

## 2024-05-22 DIAGNOSIS — M899 Disorder of bone, unspecified: Secondary | ICD-10-CM | POA: Diagnosis not present

## 2024-05-22 DIAGNOSIS — R1310 Dysphagia, unspecified: Secondary | ICD-10-CM | POA: Diagnosis not present

## 2024-05-22 DIAGNOSIS — I119 Hypertensive heart disease without heart failure: Secondary | ICD-10-CM | POA: Diagnosis not present

## 2024-05-22 DIAGNOSIS — M4854XA Collapsed vertebra, not elsewhere classified, thoracic region, initial encounter for fracture: Secondary | ICD-10-CM | POA: Diagnosis not present

## 2024-05-22 DIAGNOSIS — G939 Disorder of brain, unspecified: Secondary | ICD-10-CM | POA: Diagnosis not present

## 2024-05-22 DIAGNOSIS — J189 Pneumonia, unspecified organism: Secondary | ICD-10-CM | POA: Diagnosis not present

## 2024-05-22 DIAGNOSIS — K5900 Constipation, unspecified: Secondary | ICD-10-CM | POA: Diagnosis not present

## 2024-05-22 DIAGNOSIS — T66XXXA Radiation sickness, unspecified, initial encounter: Secondary | ICD-10-CM | POA: Diagnosis not present

## 2024-05-22 LAB — CMP (CANCER CENTER ONLY)
ALT: 9 U/L (ref 0–44)
AST: 11 U/L — ABNORMAL LOW (ref 15–41)
Albumin: 3.2 g/dL — ABNORMAL LOW (ref 3.5–5.0)
Alkaline Phosphatase: 42 U/L (ref 38–126)
Anion gap: 6 (ref 5–15)
BUN: 27 mg/dL — ABNORMAL HIGH (ref 8–23)
CO2: 34 mmol/L — ABNORMAL HIGH (ref 22–32)
Calcium: 9.4 mg/dL (ref 8.9–10.3)
Chloride: 105 mmol/L (ref 98–111)
Creatinine: 0.96 mg/dL (ref 0.44–1.00)
GFR, Estimated: >60 mL/min (ref >60)
Glucose, Bld: 136 mg/dL — ABNORMAL HIGH (ref 70–99)
Potassium: 3.1 mmol/L — ABNORMAL LOW (ref 3.5–5.1)
Sodium: 145 mmol/L (ref 135–145)
Total Bilirubin: 0.3 mg/dL (ref 0.0–1.2)
Total Protein: 6 g/dL — ABNORMAL LOW (ref 6.5–8.1)

## 2024-05-22 LAB — CBC WITH DIFFERENTIAL (CANCER CENTER ONLY)
Abs Immature Granulocytes: 0.04 K/uL (ref 0.00–0.07)
Basophils Absolute: 0 K/uL (ref 0.0–0.1)
Basophils Relative: 1 %
Eosinophils Absolute: 0.2 K/uL (ref 0.0–0.5)
Eosinophils Relative: 4 %
HCT: 35.1 % — ABNORMAL LOW (ref 36.0–46.0)
Hemoglobin: 11.6 g/dL — ABNORMAL LOW (ref 12.0–15.0)
Immature Granulocytes: 1 %
Lymphocytes Relative: 40 %
Lymphs Abs: 2.1 K/uL (ref 0.7–4.0)
MCH: 31.5 pg (ref 26.0–34.0)
MCHC: 33 g/dL (ref 30.0–36.0)
MCV: 95.4 fL (ref 80.0–100.0)
Monocytes Absolute: 0.5 K/uL (ref 0.1–1.0)
Monocytes Relative: 9 %
Neutro Abs: 2.4 K/uL (ref 1.7–7.7)
Neutrophils Relative %: 45 %
Platelet Count: 225 K/uL (ref 150–400)
RBC: 3.68 MIL/uL — ABNORMAL LOW (ref 3.87–5.11)
RDW: 12.2 % (ref 11.5–15.5)
WBC Count: 5.3 K/uL (ref 4.0–10.5)
nRBC: 0 % (ref 0.0–0.2)

## 2024-05-22 LAB — TSH: TSH: 1.82 u[IU]/mL (ref 0.350–4.500)

## 2024-05-22 MED ORDER — SODIUM CHLORIDE 0.9 % IV SOLN: Freq: Once | INTRAVENOUS | Status: AC

## 2024-05-22 MED ORDER — PROCHLORPERAZINE MALEATE 10 MG PO TABS
10.0000 mg | ORAL_TABLET | Freq: Once | ORAL | Status: AC
  Administered 2024-05-22: 10 mg via ORAL
  Filled 2024-05-22: qty 1

## 2024-05-22 MED ORDER — POTASSIUM CHLORIDE 10 MEQ/100ML IV SOLN
10.0000 meq | INTRAVENOUS | Status: AC
  Administered 2024-05-22 (×2): 10 meq via INTRAVENOUS
  Filled 2024-05-22 (×2): qty 100

## 2024-05-22 MED ORDER — SODIUM CHLORIDE 0.9 % IV SOLN
200.0000 mg | Freq: Once | INTRAVENOUS | Status: AC
  Administered 2024-05-22: 200 mg via INTRAVENOUS
  Filled 2024-05-22: qty 200

## 2024-05-22 NOTE — Patient Instructions (Signed)
 [facility] - A DEPT OF [facility]  Discharge Instructions: Thank you for choosing [facility] to provide your oncology and hematology care.   If you have a lab appointment with the Cancer Center, please go directly to the Cancer Center and check in at the registration area.   Wear comfortable clothing and clothing appropriate for easy access to any Portacath or PICC line.   We strive to give you quality time with your provider. You may need to reschedule your appointment if you arrive late (15 or more minutes).  Arriving late affects you and other patients whose appointments are after yours.  Also, if you miss three or more appointments without notifying the office, you may be dismissed from the clinic at the provider's discretion.      For prescription refill requests, have your pharmacy contact our office and allow 72 hours for refills to be completed.    Today you received the following chemotherapy and/or immunotherapy agent: Pembrolizumab  (Keytruda )   To help prevent nausea and vomiting after your treatment, we encourage you to take your nausea medication as directed.  BELOW ARE SYMPTOMS THAT SHOULD BE REPORTED IMMEDIATELY: *FEVER GREATER THAN 100.4 F (38 C) OR HIGHER *CHILLS OR SWEATING *NAUSEA AND VOMITING THAT IS NOT CONTROLLED WITH YOUR NAUSEA MEDICATION *UNUSUAL SHORTNESS OF BREATH *UNUSUAL BRUISING OR BLEEDING *URINARY PROBLEMS (pain or burning when urinating, or frequent urination) *BOWEL PROBLEMS (unusual diarrhea, constipation, pain near the anus) TENDERNESS IN MOUTH AND THROAT WITH OR WITHOUT PRESENCE OF ULCERS (sore throat, sores in mouth, or a toothache) UNUSUAL RASH, SWELLING OR PAIN  UNUSUAL VAGINAL DISCHARGE OR ITCHING   Items with * indicate a potential emergency and should be followed up as soon as possible or go to the Emergency Department if any problems should occur.  Please show the CHEMOTHERAPY ALERT CARD or  IMMUNOTHERAPY ALERT CARD at check-in to the Emergency Department and triage nurse.  Should you have questions after your visit or need to cancel or reschedule your appointment, please contact [facility] - A DEPT OF [facility]  Dept: [phone]  and follow the prompts.  Office hours are 8:00 a.m. to 4:30 p.m. Monday - Friday. Please note that voicemails left after 4:00 p.m. may not be returned until the following business day.  We are closed weekends and major holidays. You have access to a nurse at all times for urgent questions. Please call the main number to the clinic Dept: [phone] and follow the prompts.   For any non-urgent questions, you may also contact your provider using MyChart. We now offer e-Visits for anyone 42 and older to request care online for non-urgent symptoms. For details visit [email or web address].   Also download the MyChart app! Go to the app store, search MyChart, open the app, select , and log in with your MyChart username and password.  Hypokalemia Hypokalemia means that the amount of potassium in the blood is lower than normal. Potassium is a mineral (electrolyte) that helps regulate the amount of fluid in the body. It also stimulates muscle tightening (contraction) and helps nerves work properly. Normally, most of the body's potassium is inside cells, and only a very small amount is in the blood. Because the amount in the blood is so small, minor changes to potassium levels in the blood can be life-threatening. What are the causes? This condition may be caused by: Antibiotic medicine. Diarrhea or  vomiting. Taking too much of a medicine that helps you have a bowel movement (laxative) can cause diarrhea and lead to hypokalemia. Chronic kidney disease (CKD). Medicines that help the body get rid of excess fluid (diuretics). Eating disorders, such as anorexia or bulimia. Low magnesium  levels in the body. Sweating a  lot. What are the signs or symptoms? Symptoms of this condition include: Weakness. Constipation. Fatigue. Muscle cramps. Mental confusion. Skipped heartbeats or irregular heartbeat (palpitations). Tingling or numbness. How is this diagnosed? This condition is diagnosed with a blood test. How is this treated? This condition may be treated by: Taking potassium supplements. Adjusting the medicines that you take. Eating more foods that contain a lot of potassium. If your potassium level is very low, you may need to get potassium through an IV and be monitored in the hospital. Follow these instructions at home: Eating and drinking  Eat a healthy diet. A healthy diet includes fresh fruits and vegetables, whole grains, healthy fats, and lean proteins. If told, eat more foods that contain a lot of potassium. These include: Nuts, such as peanuts and pistachios. Seeds, such as sunflower seeds and pumpkin seeds. Peas, lentils, and lima beans. Whole grain and bran cereals and breads. Fresh fruits and vegetables, such as apricots, avocado, bananas, cantaloupe, kiwi, oranges, tomatoes, asparagus, and potatoes. Juices, such as orange, tomato, and prune. Lean meats, including fish. Milk and milk products, such as yogurt. General instructions Take over-the-counter and prescription medicines only as told by your health care provider. This includes vitamins, natural food products, and supplements. Keep all follow-up visits. This is important. Contact a health care provider if: You have weakness that gets worse. You feel your heart pounding or racing. You vomit. You have diarrhea. You have diabetes and you have trouble keeping your blood sugar in your target range. Get help right away if: You have chest pain. You have shortness of breath. You have vomiting or diarrhea that lasts for more than 2 days. You faint. These symptoms may be an emergency. Get help right away. Call 911. Do not wait  to see if the symptoms will go away. Do not drive yourself to the hospital. Summary Hypokalemia means that the amount of potassium in the blood is lower than normal. This condition is diagnosed with a blood test. Hypokalemia may be treated by taking potassium supplements, adjusting the medicines that you take, or eating more foods that are high in potassium. If your potassium level is very low, you may need to get potassium through an IV and be monitored in the hospital. This information is not intended to replace advice given to you by your health care provider. Make sure you discuss any questions you have with your health care provider. Document Revised: 05/11/2021 Document Reviewed: 05/11/2021 Elsevier Patient Education  2024 [employer].

## 2024-05-22 NOTE — Progress Notes (Unsigned)
 [facility] [phone]   [phone]  PROGRESS NOTE  Patient Care Team: Perri Ronal PARAS, MD as PCP - General (Internal Medicine) Federico Norleen ONEIDA MADISON, MD as PCP - Hematology/Oncology (Hematology and Oncology) Evertt Lonell BROCKS, RN as Oncology Nurse Navigator Marvine Almarie CROME, DO as Consulting Physician [facility] and Palliative Medicine) Lbcardiology, Rande, MD as Rounding Team (Cardiology) Pickenpack-Cousar, Fannie SAILOR, NP as Nurse Practitioner (Hospice and Palliative Medicine)  Hematological/Oncological History # 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 CT of the head showed showed concerning for metastatic disease involving the left cerebellum and right occipital lobe.  2) 09/30/2019: MRI brain confirms mass within the left cerebellum measures 1.6 x 1.3 x 1.5 cm as well as at least 8 metastatic lesions within the supratentorial and infratentorial brain as outlined 3) 10/01/2019: PET CT scan revealed hypermetabolic infrahilar right lower lobe mass with hypermetabolic nodal metastases in the right hilum, mediastinum, right supraclavicular region, left axilla and lower left neck. 4) 10/08/2019: US  guided biopsy of the lymph nodes confirms poorly differentiated non-small cell carcinoma. Immunohistochemistry confirms adenocarcinoma of lung primary. PD-L1 and NGS later revealed a MET Exon 14 mutation and TPS of 90%.  5) 10/12/2019: Establish care with Dr. Federico  6) 11/04/2019: Day 1 of capmatinib 400mg  BID 7) 11/09/2019: presented to Rad/Onc with a DVT in LLE. Seen in symptom management clinic and started on apixaban .  8) 12/29/2019: CT C/A/P showed interval decrease in size of mass involving the superior segment of right lower lobe and reduction in mediastinal and left supraclavicular adenopathy though some small spinal lesions were noted in the interim between the two sets of  scans 9) 01/25/2020: temporarily stopped capmatinib due to symptoms of nausea/fatigue. Restarted therapy on 01/30/2020 after brief chemo holiday.  10) 03/22/2020: CT C/A/P reveals a mixed picture, with new lung nodule and increased lymphadenopathy, however response in bone lesions. D/c capmatinib therapy 11) 04/08/2020: start of Carbo/Pem/Pem for 2nd line treatment. Cycle 1 Day 1   12) 04/29/2020: Cycle 2 Day 1 Carbo/Pem/Pem  13) 05/20/2020: Cycle 3 Day 1 Carbo/Pem/Pem  14) 06/09/2020: Cycle 4 Day 1 Carbo/Pem/Pem  15) 06/29/2020: Cycle 5 Day 1 maintenance Pem/Pem  16) 07/20/2020: Cycle 6 Day 1 maintenance Pem/Pem  17) 08/12/2020: Cycle 7 Day 1 maintenance Pem/Pem  18) 09/01/2020: Cycle 8 Day 1 maintenance Pem/Pem  19) 09/21/2020: Cycle 9 Day 1 maintenance Pem/Pem  20) 10/13/2020: Cycle 10 Day 1 maintenance Pem/Pem  21) 11/03/2020: Cycle 11 Day 1 maintenance Pem/Pem  22) 11/25/2020:  Cycle 12 Day 1 maintenance Pem/Pem 23) 12/15/2020:  Cycle 13 Day 1 maintenance Pem/Pem 24) 01/05/2021:  Cycle 14 Day 1 maintenance Pem/Pem 25) 01/26/2021:  Cycle 15 Day 1 maintenance Pem/Pem plus Bevacizumab .  02/17/2021: Cycle 16 Day 1 maintenance Pem/Pem plus Bevacizumab .  03/10/2021: Cycle 17 Day 1 maintenance Pem/Pem plus Bevacizumab .  03/31/2021: Cycle 18 Day 1 maintenance Pem/Pem  04/20/2021:  Cycle 19 Day 1 maintenance Pem/Pem  05/12/2021: Cycle 20 Day 1 maintenance Pem/Pem 06/01/2021: Cycle 21 Day 1 maintenance Pem/Pem 06/22/2021: Cycle 22 Day 1 maintenance Pem/Pem 07/13/2021: Cycle 23 Day 1 maintenance Pem/Pem 08/16/2021: Cycle 24 Day 1 maintenance Pem/Pem 09/08/2021: Cycle 25 Day 1 maintenance Pem/Pem plus Bevacizumab  09/28/2021: Cycle 26 Day 1 maintenance Pem/Pem plus Bevacizumab  10/20/2021: Cycle 27 Day 1 maintenance Pem/Pem plus Bevacizumab  11/09/2021: Cycle 28 Day 1 maintenance Pem/Pem  12/06/2021: Cycle 29 Day 1 maintenance Pem/Pem  12/27/2021: Cycle  30 Day 1 maintenance Pem/Pem  01/18/2022: Cycle 31 Day 1 maintenance  Pem/Pem  02/07/2022: Cycle 32 Day 1 maintenance Pem/Pem  02/28/2022: Cycle 33 Day 1 maintenance Pem/Pem  03/23/2022: Cycle 34 Day 1 maintenance Pem/Pem  04/13/2022: Cycle 35 Day 1 maintenance Pem/Pem  05/04/2022: Cycle 36 Day 1 maintenance Pem/Pem  05/25/2022: Cycle 37 Day 1 maintenance Pem/Pem  06/14/2022: Cycle 38 Day 1 maintenance Pem/Pem 07/05/2022: Cycle 39 Day 1 maintenance Pem/Pem 08/13/2022: Cycle 40 Day 1 maintenance Pem/Pem 09/06/2022:Cycle 41 Day 1 maintenance Pem/Pem 09/27/2022: Cycle 42 Day 1 maintenance Pem/Pem 10/19/2022: Cycle 43 Day 1 maintenance Pem/Pem 11/09/2022: Cycle 44 Day 1 maintenance Pem/Pem 11/30/2022: Cycle 45 Day 1 maintenance Pem/Pem 12/20/2022: Cycle 46 Day 1 maintenance Pem/Pem 01/10/2023: Cycle 47 Day 1 maintenance Pem/Pem 01/31/2023:Cycle 48 Day 1 maintenance Pem/Pem 02/22/2023: Cycle 49 Day 1 maintenance Pem/Pem 03/15/2023: Cycle 50 Day 1 maintenance Pem/Pem 04/04/2023: Cycle 51 Day 1 maintenance Pem/Pem 04/26/2023: Cycle 52 Day 1 maintenance Pem/Pem 05/17/2023: Cycle 53 Day 1 maintenance Pem/Pem 07/19/2023: Cycle 54 Day 1 maintenance Pembro (holding Pemetrexed ) 08/12/2023: Cycle 55 Day 1 maintenance Pem/Pem 09/02/2023: Cycle 56 Day 1 maintenance Pembro (holding Pemetrexed ) 09/23/2023: Cycle 57 Day 1 maintenance Pembro (holding Pemetrexed ) 10/15/2023: Cycle 58 Day 1 maintenance Pembro (holding Pemetrexed ) 11/27/2023:  Cycle 59 Day 1 maintenance Pembro (holding Pemetrexed ) 01/10/2024: Cycle 60 Day 1 maintenance Pembro (holding Pemetrexed ) 01/31/2024: Cycle 61 Day 1 maintenance Pembro (holding Pemetrexed )  Interval History:  Virginia [name] 68 y.o. female with medical history significant for metastatic adenocarcinoma of the lung presents for a follow up visit. The patient's last visit was on 03/12/2024.  On exam today Virginia [name] reports she has been well overall since our last visit with the dizziness continuing to be her most prominent issue.  She reports her appetite is  good and her energy is strong, though she is taking naps throughout the day.  She does sleep well at night.  She reports that the swelling in her legs is markedly improved.  She does not have any trouble with nausea, vomiting, or diarrhea.  She has had no infectious symptoms such as runny nose, sore throat, cough.  She denies any fevers, chills, sweats.  She reports that she has been eating well and enjoying foods such as chicken wings and pizza.  She reports her husband cooks veggies and grill as well.  Otherwise she has had no changes in her health and is willing and able to proceed with Keytruda  therapy at this time.  MEDICAL HISTORY:  Past Medical History:  Diagnosis Date   Anemia    Anxiety    Concussion 09/28/2019   DVT (deep venous thrombosis) (HCC) 2021   L leg   Dyspnea    GERD (gastroesophageal reflux disease)    Hypercholesterolemia    per pt, she does not have elevated lipids   Hypertension    met lung ca dx'd 09/2019   mets to spine, hip and brain   PONV (postoperative nausea and vomiting)    Tobacco abuse     SURGICAL HISTORY: Past Surgical History:  Procedure Laterality Date   ABDOMINAL HYSTERECTOMY     partial/ left ovaries   APPLICATION OF CRANIAL NAVIGATION N/A 12/02/2020   Procedure: APPLICATION OF CRANIAL NAVIGATION;  Surgeon: [name], MD;  Location: [facility] OR;  Service: Neurosurgery;  Laterality: N/A;   CHOLECYSTECTOMY     CRANIOTOMY N/A 12/02/2020   Procedure: Posterior fossa craniotomy for tumor resection with brainlab;  Surgeon: [name], MD;  Location:  [facility] OR;  Service: Neurosurgery;  Laterality: N/A;   DILATION AND CURETTAGE OF UTERUS     IR IMAGING GUIDED PORT INSERTION  10/23/2019   IR PATIENT EVAL TECH 0-60 MINS  11/21/2021   IR PATIENT EVAL TECH 0-60 MINS  11/24/2021   IR PATIENT EVAL TECH 0-60 MINS  11/29/2021   IR PATIENT EVAL TECH 0-60 MINS  12/04/2021   IR PATIENT EVAL TECH 0-60 MINS  12/12/2021   IR PATIENT EVAL TECH 0-60 MINS  12/19/2021   IR  PATIENT EVAL TECH 0-60 MINS  12/27/2021   IR PATIENT EVAL TECH 0-60 MINS  01/03/2022   IR PATIENT EVAL TECH 0-60 MINS  01/10/2022   IR PATIENT EVAL TECH 0-60 MINS  01/18/2022   IR PATIENT EVAL TECH 0-60 MINS  02/13/2022   IR PATIENT EVAL TECH 0-60 MINS  02/20/2022   IR RADIOLOGIST EVAL & MGMT  01/23/2022   IR REMOVAL TUN ACCESS W/ PORT W/O FL MOD SED  11/17/2021   KYPHOPLASTY N/A 03/15/2020   Procedure: Thoracic Eight KYPHOPLASTY;  Surgeon: [name], MD;  Location: [facility] OR;  Service: Neurosurgery;  Laterality: N/A;  prone    LIPOMA EXCISION  2018   removed under left breast and right thigh.   PERICARDIOCENTESIS N/A 12/10/2023   Procedure: PERICARDIOCENTESIS;  Surgeon: Wendel Lurena POUR, MD;  Location: [facility] INVASIVE CV LAB;  Service: Cardiovascular;  Laterality: N/A;   TUBAL LIGATION      ALLERGIES:  is allergic to scopolamine .  MEDICATIONS:  Current Outpatient Medications  Medication Sig Dispense Refill   acetaminophen  (TYLENOL ) 325 MG tablet Take 1-2 tablets (325-650 mg total) by mouth every 4 (four) hours as needed for mild pain (pain score 1-3). (Patient taking differently: Take 500 mg by mouth every 4 (four) hours as needed for mild pain (pain score 1-3).)     albuterol  (VENTOLIN  HFA) 108 (90 Base) MCG/ACT inhaler INHALE 2 PUFFS INTO THE LUNGS EVERY 6 HOURS AS NEEDED FOR WHEEZING OR SHORTNESS OF BREATH 6.7 g 11   ALPRAZolam  (XANAX ) 0.25 MG tablet Take 1 tablet (0.25 mg total) by mouth 2 (two) times daily as needed for anxiety. 30 tablet 0   Cholecalciferol  (VITAMIN D3) 125 MCG (5000 UT) TABS Take 5,000 Units by mouth every evening.     colchicine  0.6 MG tablet Take 1 tablet (0.6 mg total) by mouth daily. 90 tablet 0   Cyanocobalamin  (VITAMIN B12 PO) Take 5,000 mcg by mouth daily. Gummies     dexamethasone  (DECADRON ) 1 MG tablet Take 1 tablet (1 mg total) by mouth daily. (Patient taking differently: Take 0.5 mg by mouth daily.) 90 tablet 0   furosemide  (LASIX ) 20 MG tablet Take 1 tablet (20 mg  total) by mouth 2 (two) times a week. Tues & Thurs     hydrocortisone  (ANUSOL -HC) 2.5 % rectal cream use in rectal area 2 to 3 times a day as needed 30 g PRN   ipratropium-albuterol  (DUONEB) 0.5-2.5 (3) MG/3ML SOLN Take 3 mLs by nebulization 3 (three) times daily. 360 mL 0   levETIRAcetam  (KEPPRA ) 1000 MG tablet Take 1 tablet (1,000 mg total) by mouth 2 (two) times daily. 180 tablet 2   Magnesium  Glycinate 100 MG CAPS Take 1 capsule by mouth in the morning and at bedtime.     meclizine  (ANTIVERT ) 50 MG tablet Take 1 tablet (50 mg total) by mouth 3 (three) times daily. (Patient taking differently: Take 100 mg by mouth 3 (three) times daily. As needed) 90 tablet 2  metoprolol  succinate (TOPROL -XL) 25 MG 24 hr tablet Take 1 tablet (25 mg total) by mouth daily. 90 tablet 3   Multiple Vitamin (MULTIVITAMIN WITH MINERALS) TABS tablet Take 2 tablets by mouth daily. Gummy     Nutritional Supplements (,FEEDING SUPPLEMENT, PROSOURCE PLUS) liquid Take 30 mLs by mouth 2 (two) times daily between meals. (Patient taking differently: Take 30 mLs by mouth 4 (four) times daily. Berry flavor)     OLANZapine  (ZYPREXA ) 2.5 MG tablet Take 1 tablet (2.5 mg total) by mouth at bedtime. 90 tablet 0   ondansetron  (ZOFRAN ) 4 MG tablet TAKE 1 TABLET(4 MG) BY MOUTH EVERY 8 HOURS AS NEEDED FOR NAUSEA OR VOMITING 40 tablet 2   pantoprazole  (PROTONIX ) 40 MG tablet TAKE 1 TABLET(40 MG) BY MOUTH DAILY 90 tablet 3   polyethylene glycol (MIRALAX  / GLYCOLAX ) 17 g packet Take 17 g by mouth daily. 14 each 0   senna-docusate (SENOKOT-S) 8.6-50 MG tablet Take 2 tablets by mouth daily as needed (Patient preference, Sennakot S for mild constipation). (Patient taking differently: Take 2 tablets by mouth daily as needed for mild constipation. 2 tabs 2 x daily)     XTAMPZA  ER 13.5 MG C12A Take 1 capsule by mouth in the morning and at bedtime. 60 capsule 0   No current facility-administered medications for this visit.    REVIEW OF SYSTEMS:    Constitutional: ( - ) fevers, ( - )  chills , ( - ) night sweats Eyes: ( - ) blurriness of vision, ( - ) double vision, ( - ) watery eyes Ears, nose, mouth, throat, and face: ( - ) mucositis, ( - ) sore throat Respiratory: ( - ) cough, ( + ) dyspnea, ( - ) wheezes Cardiovascular: ( - ) palpitation, ( - ) chest discomfort, ( - ) lower extremity swelling Gastrointestinal:  ( + ) nausea, ( - ) heartburn, ( - ) change in bowel habits Skin: ( - ) abnormal skin rashes Lymphatics: ( - ) new lymphadenopathy, ( - ) easy bruising Neurological: ( - ) numbness, ( - ) tingling, ( - ) new weaknesses Behavioral/Psych: ( - ) mood change, ( - ) new changes  All other systems were reviewed with the patient and are negative.  PHYSICAL EXAMINATION: ECOG PERFORMANCE STATUS: 2 - Symptomatic, <50% confined to bed  There were no vitals filed for this visit.    There were no vitals filed for this visit.      GENERAL: well appearing middle aged Caucasian female in NAD  SKIN: skin color, texture, turgor are normal, no rashes or significant lesions EYES: conjunctiva are pink and non-injected, sclera clear LUNGS: wheezing at lung bases. clear to auscultation and percussion with normal breathing effort HEART: regular rate & rhythm and no murmurs and bilateral trace pitting lower extremity edema Musculoskeletal: no cyanosis of digits and no clubbing PSYCH: alert & oriented x 3, fluent speech NEURO: no focal motor/sensory deficits  LABORATORY DATA:  I have reviewed the data as listed    Latest Ref Rng & Units 04/26/2024    4:33 PM 04/03/2024    2:08 PM 03/12/2024   11:12 AM  CBC  WBC 4.0 - 10.5 K/uL 9.1  7.5  8.2   Hemoglobin 12.0 - 15.0 g/dL 87.1  86.7  87.2   Hematocrit 36.0 - 46.0 % 38.4  38.8  37.2   Platelets 150 - 400 K/uL 214  232  214        Latest Ref Rng &  Units 05/01/2024    4:05 AM 04/30/2024    4:31 AM 04/29/2024   10:53 AM  CMP  Glucose 70 - 99 mg/dL 897  895  887   BUN 8 - 23 mg/dL  26  25  24    Creatinine 0.44 - 1.00 mg/dL 9.01  9.12  8.90   Sodium 135 - 145 mmol/L 137  141  143   Potassium 3.5 - 5.1 mmol/L 3.6  3.9  2.8   Chloride 98 - 111 mmol/L 105  111  108   CO2 22 - 32 mmol/L 24  23  24    Calcium  8.9 - 10.3 mg/dL 8.6  8.2  8.4      RADIOGRAPHIC STUDIES: I personally have viewed the radiographic studies below: CT CHEST ABDOMEN PELVIS W CONTRAST Result Date: 04/26/2024 CLINICAL DATA:  Sepsis, cough, generalized weakness, diarrhea, history of lung cancer metastasized to the brain. Unable to keep fluids down EXAM: CT CHEST, ABDOMEN, AND PELVIS WITH CONTRAST TECHNIQUE: Multidetector CT imaging of the chest, abdomen and pelvis was performed following the standard protocol during bolus administration of intravenous contrast. RADIATION DOSE REDUCTION: This exam was performed according to the departmental dose-optimization program which includes automated exposure control, adjustment of the mA and/or kV according to patient size and/or use of iterative reconstruction technique. CONTRAST:  OMNIPAQUE  IOHEXOL  300 MG/ML  SOLN COMPARISON:  Same day chest radiograph and CT 01/27/2024 FINDINGS: CT CHEST FINDINGS Cardiovascular: Similar large pericardial effusion. Coronary artery and aortic atherosclerotic calcification. Mediastinum/Nodes: Trachea and esophagus are unremarkable. No pathologic adenopathy. Lungs/Pleura: Lingular and bilateral lower lobe consolidation with low-density fluid. Numerous scattered centrilobular ground-glass nodules are likely infectious/inflammatory and have progressed since 01/27/2024 trace right pleural effusion. No pneumothorax Musculoskeletal: No acute fracture. Chronic T8 compression fracture status post vertebroplasty. CT ABDOMEN PELVIS FINDINGS Hepatobiliary: Cholecystectomy. Unremarkable liver and biliary tree. Pancreas: Unremarkable. Spleen: Unremarkable. Adrenals/Urinary Tract: Normal adrenal glands. No urinary calculi or hydronephrosis.  Unremarkable bladder. Stomach/Bowel: Normal caliber large and small bowel. No bowel wall thickening. Normal appendix. Stomach is within normal limits. Vascular/Lymphatic: Advanced aortic atherosclerotic calcification. No pathologic adenopathy. Reproductive: Hysterectomy.  No adnexal mass. Other: No free intraperitoneal fluid or air. Musculoskeletal: No acute fracture. IMPRESSION: 1. Multifocal pneumonia. 2. Similar large pericardial effusion. 3. No acute abnormality in the abdomen or pelvis. 4. Aortic Atherosclerosis (ICD10-I70.0). Electronically Signed   By: Norman Gatlin M.D.   On: 04/26/2024 20:40   DG Chest Port 1 View Result Date: 04/26/2024 CLINICAL DATA:  Questionable sepsis. EXAM: PORTABLE CHEST 1 VIEW COMPARISON:  08/26/2023 , CT 01/27/2019 FINDINGS: Stable large cardiac silhouette. LEFT lobe atelectasis. RIGHT lung clear. No effusion or infiltrate. IMPRESSION: 1. Persistent enlarged cardiac silhouette. Potential pericardial effusion. Pericardial effusion on comparison CT 2. LEFT lobe atelectasis. Electronically Signed   By: Jackquline Boxer M.D.   On: 04/26/2024 17:26      ASSESSMENT & PLAN Virginia [name] is a 68 y.o. female with medical history significant for metastatic adenocarcinoma of the lung presents for a follow up visit. 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   Previously we discussed Carboplatin /Pembrolizumab /Pemetrexed  and the expected side effect from these medications.  We discussed the possible side effects of immunotherapy including colitis, hepatitis, dermatitis, and pneumonitis.  Additionally we discussed the side effects of carboplatin  which include suppression of blood counts, nephrotoxicity, and nausea.  Additionally we discussed pemetrexed  which can also have effects on counts and would require supplementation with vitamin  B12 and folic acid .  She voiced her understanding of these side effects and treatment plans moving forward.   I also noted that we would be able to drop the chemotherapy agents that she had intolerance to continue with pembrolizumab  alone given her high TPS score.  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 Due to worsening dizziness, she resumed this from Cycle 25-27.  PLAN: --Due for Cycle 64, Day 1 of maintenance single agent Pembrolizumab  today.  --Labs from today were reviewed and adequate for treatment.  Labs today show WBC ***, creatinine overall stable and LFTs in range. --Last CT scan in 02/21/2024 showed no evidence of progressive disease. Next scan due around August 2025.  --RTC q 3 weeks for labs, follow up visit prior to Cycle 65.  #Brain Metastasis --appreciate the assistance of Dr. Buckley in treating her brain metastasis and symptoms. --radiation therapy to the brain completed on 11/16/2019.  --MRI brain on 04/29/2020 showed evidence of small brain bleed. Eliquis  therapy was discontinued.  --MRI on 11/04/2020 showed concern for progression of intracranial disease.  --On 12/02/2020, patient underwent craniotomy, resection of progressive cerebellar mass by Dr. Unice. 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  --MRI on 12/23/2020 showed evidence of radiation necrosis. Dr. Buckley evaluated the patient on 01/02/2021 with recommendations to add IV Bevacizumab  10 mg/kg q 3 weeks ( started 01/26/2021). Completed a full course of this therapy. --Patient was evaluated by Dr. Buckley on 08/29/2021 and recommended to resume Bevacizumab  due to worsening dizziness.  Which was given from Cycle 25-27.  Plan: --continue to follow with Dr. Buckley  --Most recent MRI from 12/18/2023 showed evidence of intracranial bleeding.  Eliquis  held indefinitely.  --Next MRI brain scheduled for Jan 2026    #Nausea/Vomiting, worsening --Regimen includes Zofran  4-8mg  q8h PRN and compazine  10mg  q6H for breakthrough PRN, Olanzpaine 2.5 mg PO QHS to help with nausea.  She is currently using Phenergan  for breakthrough. --continue to monitor   #Hypokalemia --likely 2/2 to decreased PO intake, hypovolemia, and BP medications --Potassium level 3.9 today.   --Currently on potassium chloride  tablets, 40 mEq PO potassium in AM and 20mEq in PM.   #Pain Control, stable --continue oxycodone  5mg  q4H PRN. Can increase dose to 10mg  q6H if pain is severe. She is taking approximately 3-4 pills per day.  --continue Xtampza  13.5 mg ER BID for long acting support.  --continue to take with Senokot to prevent opioid induced constipation. 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    # Hypertension: --Continue Norvasc  to 10 mg daily.  --continue to monitor at home and in clinic.    No orders of the defined types were placed in this encounter.   All questions were answered. The patient knows to call the clinic with any problems, questions or concerns.  I have spent a total of 30 minutes minutes of face-to-face and non-face-to-face time, preparing to see the patient, performing a medically appropriate examination, counseling and educating the patient,  documenting clinical information in the electronic health record, and care coordination.   Norleen IVAR Kidney, MD Department of Hematology/Oncology [facility] at [facility] Phone: [phone] Pager: [phone] Email: [email or web address]   05/22/2024  7:38 AM   Literature Support:  Lucienne CROME, Rodrguez-Abreu D, Gadgeel S, Esteban E, Felip E, De Angelis F, Domine M, Clingan P, Hochmair MJ, [place], Cheng SY, Bischoff HG, Peled N, Grossi F, Jennens RR, Reck M, [place], [place],  [place], Rubio-Viqueira B, Novello S, Kurata T, Gray JE, Vida J, Wei Z, Yang J, Raftopoulos H, [place], [place] [employer]; [id number] Investigators. Pembrolizumab  plus Chemotherapy in Metastatic Non-Small-Cell Lung Cancer. LOISE Diedra PARAS Med. 2018 May 31;378(22):2078-2092.  --Median progression-free survival was 8.8 months (95% CI, 7.6 to 9.2) in the pembrolizumab -combination group and 4.9 months (95% CI, 4.7 to 5.5) in the placebo-combination group (hazard ratio for disease progression or death, 0.52; 95% CI, 0.43 to 0.64; P<0.001). Adverse events of grade 3 or higher occurred in 67.2% of the patients in the pembrolizumab -combination group and in 65.8% of those in the placebo-combination group.  Missy KATHEE Diona CROME Cesario OLEGARIO Patt L. Efficacy of pemetrexed -based regimens in advanced non-small cell lung cancer patients with activating epidermal growth factor receptor mutations after tyrosine kinase inhibitor failure: a systematic review. Onco Targets Ther. 2018;11:2121-2129.   --The weighted median PFS, median OS, and ORR for patients treated with pem regimens were 5.09 months, 15.91 months, and 30.19%, respectively. Our systematic review results showed a favorable efficacy profile of pem regimens in NSCLC patients with EGFR mutation after EGFR-TKI failure.

## 2024-05-22 NOTE — Progress Notes (Signed)
 Order entered for KCl 127mEq/10mL x 2 per Dr. Federico.  Leenah Seidner, PharmD, MBA

## 2024-05-22 NOTE — Progress Notes (Signed)
 Pt bp after treatment was lower than baseline 98/76, asymptomatic pt denied sob and pt stated dizziness is a baseline for her. Per Dr Federico pt ok to dc home and encourage po hydration.

## 2024-05-24 ENCOUNTER — Encounter: Payer: Self-pay | Admitting: Hematology and Oncology

## 2024-05-28 ENCOUNTER — Other Ambulatory Visit: Payer: Self-pay | Admitting: Internal Medicine

## 2024-06-05 ENCOUNTER — Ambulatory Visit: Admitting: Physician Assistant

## 2024-06-05 ENCOUNTER — Ambulatory Visit

## 2024-06-05 ENCOUNTER — Other Ambulatory Visit

## 2024-06-11 ENCOUNTER — Other Ambulatory Visit: Payer: Self-pay | Admitting: Hematology and Oncology

## 2024-06-11 ENCOUNTER — Other Ambulatory Visit: Payer: Self-pay | Admitting: Nurse Practitioner

## 2024-06-11 DIAGNOSIS — C7931 Secondary malignant neoplasm of brain: Secondary | ICD-10-CM

## 2024-06-11 DIAGNOSIS — E032 Hypothyroidism due to medicaments and other exogenous substances: Secondary | ICD-10-CM

## 2024-06-11 DIAGNOSIS — C3490 Malignant neoplasm of unspecified part of unspecified bronchus or lung: Secondary | ICD-10-CM

## 2024-06-11 NOTE — Progress Notes (Signed)
 [name] Telephone:([phone]) [phone]   [phone]) [phone]  PROGRESS NOTE  Patient Care Team: Perri Ronal PARAS, MD as PCP - General (Internal Medicine) Federico Norleen ONEIDA MADISON, MD as PCP - Hematology/Oncology (Hematology and Oncology) Evertt Lonell BROCKS, RN as Oncology Nurse Navigator Marvine Almarie CROME, DO as Consulting Physician [facility] and Palliative Medicine) Lbcardiology, Rande, MD as Rounding Team (Cardiology) Pickenpack-Cousar, Fannie SAILOR, NP as Nurse Practitioner (Hospice and Palliative Medicine)  Hematological/Oncological History # 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 CT of the head showed showed concerning for metastatic disease involving the left cerebellum and right occipital lobe.  2) 09/30/2019: MRI brain confirms mass within the left cerebellum measures 1.6 x 1.3 x 1.5 cm as well as at least 8 metastatic lesions within the supratentorial and infratentorial brain as outlined 3) 10/01/2019: PET CT scan revealed hypermetabolic infrahilar right lower lobe mass with hypermetabolic nodal metastases in the right hilum, mediastinum, right supraclavicular region, left axilla and lower left neck. 4) 10/08/2019: US  guided biopsy of the lymph nodes confirms poorly differentiated non-small cell carcinoma. Immunohistochemistry confirms adenocarcinoma of lung primary. PD-L1 and NGS later revealed a MET Exon 14 mutation and TPS of 90%.  5) 10/12/2019: Establish care with Dr. Federico  6) 11/04/2019: Day 1 of capmatinib 400mg  BID 7) 11/09/2019: presented to Rad/Onc with a DVT in LLE. Seen in symptom management clinic and started on apixaban .  8) 12/29/2019: CT C/A/P showed interval decrease in size of mass involving the superior segment of right lower lobe and reduction in mediastinal and left supraclavicular adenopathy though some small spinal lesions were noted in the interim between the two sets of  scans 9) 01/25/2020: temporarily stopped capmatinib due to symptoms of nausea/fatigue. Restarted therapy on 01/30/2020 after brief chemo holiday.  10) 03/22/2020: CT C/A/P reveals a mixed picture, with new lung nodule and increased lymphadenopathy, however response in bone lesions. D/c capmatinib therapy 11) 04/08/2020: start of Carbo/Pem/Pem for 2nd line treatment. Cycle 1 Day 1   12) 04/29/2020: Cycle 2 Day 1 Carbo/Pem/Pem  13) 05/20/2020: Cycle 3 Day 1 Carbo/Pem/Pem  14) 06/09/2020: Cycle 4 Day 1 Carbo/Pem/Pem  15) 06/29/2020: Cycle 5 Day 1 maintenance Pem/Pem  16) 07/20/2020: Cycle 6 Day 1 maintenance Pem/Pem  17) 08/12/2020: Cycle 7 Day 1 maintenance Pem/Pem  18) 09/01/2020: Cycle 8 Day 1 maintenance Pem/Pem  19) 09/21/2020: Cycle 9 Day 1 maintenance Pem/Pem  20) 10/13/2020: Cycle 10 Day 1 maintenance Pem/Pem  21) 11/03/2020: Cycle 11 Day 1 maintenance Pem/Pem  22) 11/25/2020:  Cycle 12 Day 1 maintenance Pem/Pem 23) 12/15/2020:  Cycle 13 Day 1 maintenance Pem/Pem 24) 01/05/2021:  Cycle 14 Day 1 maintenance Pem/Pem 25) 01/26/2021:  Cycle 15 Day 1 maintenance Pem/Pem plus Bevacizumab .  02/17/2021: Cycle 16 Day 1 maintenance Pem/Pem plus Bevacizumab .  03/10/2021: Cycle 17 Day 1 maintenance Pem/Pem plus Bevacizumab .  03/31/2021: Cycle 18 Day 1 maintenance Pem/Pem  04/20/2021:  Cycle 19 Day 1 maintenance Pem/Pem  05/12/2021: Cycle 20 Day 1 maintenance Pem/Pem 06/01/2021: Cycle 21 Day 1 maintenance Pem/Pem 06/22/2021: Cycle 22 Day 1 maintenance Pem/Pem 07/13/2021: Cycle 23 Day 1 maintenance Pem/Pem 08/16/2021: Cycle 24 Day 1 maintenance Pem/Pem 09/08/2021: Cycle 25 Day 1 maintenance Pem/Pem plus Bevacizumab  09/28/2021: Cycle 26 Day 1 maintenance Pem/Pem plus Bevacizumab  10/20/2021: Cycle 27 Day 1 maintenance Pem/Pem plus Bevacizumab  11/09/2021: Cycle 28 Day 1 maintenance Pem/Pem  12/06/2021: Cycle 29 Day 1 maintenance Pem/Pem  12/27/2021: Cycle  30 Day 1 maintenance Pem/Pem  01/18/2022: Cycle 31 Day 1 maintenance  Pem/Pem  02/07/2022: Cycle 32 Day 1 maintenance Pem/Pem  02/28/2022: Cycle 33 Day 1 maintenance Pem/Pem  03/23/2022: Cycle 34 Day 1 maintenance Pem/Pem  04/13/2022: Cycle 35 Day 1 maintenance Pem/Pem  05/04/2022: Cycle 36 Day 1 maintenance Pem/Pem  05/25/2022: Cycle 37 Day 1 maintenance Pem/Pem  06/14/2022: Cycle 38 Day 1 maintenance Pem/Pem 07/05/2022: Cycle 39 Day 1 maintenance Pem/Pem 08/13/2022: Cycle 40 Day 1 maintenance Pem/Pem 09/06/2022:Cycle 41 Day 1 maintenance Pem/Pem 09/27/2022: Cycle 42 Day 1 maintenance Pem/Pem 10/19/2022: Cycle 43 Day 1 maintenance Pem/Pem 11/09/2022: Cycle 44 Day 1 maintenance Pem/Pem 11/30/2022: Cycle 45 Day 1 maintenance Pem/Pem 12/20/2022: Cycle 46 Day 1 maintenance Pem/Pem 01/10/2023: Cycle 47 Day 1 maintenance Pem/Pem 01/31/2023:Cycle 48 Day 1 maintenance Pem/Pem 02/22/2023: Cycle 49 Day 1 maintenance Pem/Pem 03/15/2023: Cycle 50 Day 1 maintenance Pem/Pem 04/04/2023: Cycle 51 Day 1 maintenance Pem/Pem 04/26/2023: Cycle 52 Day 1 maintenance Pem/Pem 05/17/2023: Cycle 53 Day 1 maintenance Pem/Pem 07/19/2023: Cycle 54 Day 1 maintenance Pembro (holding Pemetrexed ) 08/12/2023: Cycle 55 Day 1 maintenance Pem/Pem 09/02/2023: Cycle 56 Day 1 maintenance Pembro (holding Pemetrexed ) 09/23/2023: Cycle 57 Day 1 maintenance Pembro (holding Pemetrexed ) 10/15/2023: Cycle 58 Day 1 maintenance Pembro (holding Pemetrexed ) 11/27/2023:  Cycle 59 Day 1 maintenance Pembro (holding Pemetrexed ) 01/10/2024: Cycle 60 Day 1 maintenance Pembro (holding Pemetrexed ) 01/31/2024: Cycle 61 Day 1 maintenance Pembro (holding Pemetrexed ) 02/21/2024 Cycle 62 Day 1 maintenance Pembro (holding Pemetrexed ) 03/12/2024: Cycle 63 Day 1 maintenance Pembro (holding Pemetrexed ) 04/03/2024: Cycle 64 Day 1 maintenance Pembro (holding Pemetrexed ) 05/22/2024: Cycle 65 Day 1 maintenance Pembro (holding Pemetrexed ) 06/12/2024: Cycle 66 Day 1 maintenance Pembro (holding Pemetrexed )  Interval History:  Virginia [name] 68  y.o. female with medical history significant for metastatic adenocarcinoma of the lung presents for a follow up visit. The patient's last visit was on 05/22/2024.  On exam today Mrs. Neaves continues to reside at rehab facility after most recent hospitalization. She currently requires 2 L of supplemental oxygen. She denies nausea, vomiting or abdominal pain. She has fluid retention in her upper and lower extremities. She is taking 1/2 dexamethasone  per day. She continues to have periodic episodes of dizziness and headaches. She denies fevers, chills, sweats, cough or congestion.   MEDICAL HISTORY:  Past Medical History:  Diagnosis Date   Anemia    Anxiety    Concussion 09/28/2019   DVT (deep venous thrombosis) (HCC) 2021   L leg   Dyspnea    GERD (gastroesophageal reflux disease)    Hypercholesterolemia    per pt, she does not have elevated lipids   Hypertension    met lung ca dx'd 09/2019   mets to spine, hip and brain   PONV (postoperative nausea and vomiting)    Tobacco abuse     SURGICAL HISTORY: Past Surgical History:  Procedure Laterality Date   ABDOMINAL HYSTERECTOMY     partial/ left ovaries   APPLICATION OF CRANIAL NAVIGATION N/A 12/02/2020   Procedure: APPLICATION OF CRANIAL NAVIGATION;  Surgeon: [name], MD;  Location: [facility] OR;  Service: Neurosurgery;  Laterality: N/A;   CHOLECYSTECTOMY     CRANIOTOMY N/A 12/02/2020   Procedure: Posterior fossa craniotomy for tumor resection with brainlab;  Surgeon: [name], MD;  Location: [facility] OR;  Service: Neurosurgery;  Laterality: N/A;   DILATION AND CURETTAGE OF UTERUS     IR IMAGING GUIDED PORT INSERTION  10/23/2019   IR PATIENT EVAL TECH 0-60 MINS  11/21/2021  IR PATIENT EVAL TECH 0-60 MINS  11/24/2021   IR PATIENT EVAL TECH 0-60 MINS  11/29/2021   IR PATIENT EVAL TECH 0-60 MINS  12/04/2021   IR PATIENT EVAL TECH 0-60 MINS  12/12/2021   IR PATIENT EVAL TECH 0-60 MINS  12/19/2021   IR PATIENT EVAL TECH 0-60 MINS  12/27/2021   IR  PATIENT EVAL TECH 0-60 MINS  01/03/2022   IR PATIENT EVAL TECH 0-60 MINS  01/10/2022   IR PATIENT EVAL TECH 0-60 MINS  01/18/2022   IR PATIENT EVAL TECH 0-60 MINS  02/13/2022   IR PATIENT EVAL TECH 0-60 MINS  02/20/2022   IR RADIOLOGIST EVAL & MGMT  01/23/2022   IR REMOVAL TUN ACCESS W/ PORT W/O FL MOD SED  11/17/2021   KYPHOPLASTY N/A 03/15/2020   Procedure: Thoracic Eight KYPHOPLASTY;  Surgeon: [name], MD;  Location: [facility] OR;  Service: Neurosurgery;  Laterality: N/A;  prone    LIPOMA EXCISION  2018   removed under left breast and right thigh.   PERICARDIOCENTESIS N/A 12/10/2023   Procedure: PERICARDIOCENTESIS;  Surgeon: Wendel Lurena POUR, MD;  Location: [facility] INVASIVE CV LAB;  Service: Cardiovascular;  Laterality: N/A;   TUBAL LIGATION      ALLERGIES:  is allergic to scopolamine .  MEDICATIONS:  Current Outpatient Medications  Medication Sig Dispense Refill   acetaminophen  (TYLENOL ) 325 MG tablet Take 1-2 tablets (325-650 mg total) by mouth every 4 (four) hours as needed for mild pain (pain score 1-3). (Patient taking differently: Take 500 mg by mouth every 4 (four) hours as needed for mild pain (pain score 1-3).)     albuterol  (VENTOLIN  HFA) 108 (90 Base) MCG/ACT inhaler INHALE 2 PUFFS INTO THE LUNGS EVERY 6 HOURS AS NEEDED FOR WHEEZING OR SHORTNESS OF BREATH 6.7 g 11   ALPRAZolam  (XANAX ) 0.25 MG tablet Take 1 tablet (0.25 mg total) by mouth 2 (two) times daily as needed for anxiety. 30 tablet 0   Cholecalciferol  (VITAMIN D3) 125 MCG (5000 UT) TABS Take 5,000 Units by mouth every evening.     colchicine  0.6 MG tablet TAKE 1 TABLET(0.6 MG) BY MOUTH DAILY 90 tablet 0   Cyanocobalamin  (VITAMIN B12 PO) Take 5,000 mcg by mouth daily. Gummies     dexamethasone  (DECADRON ) 1 MG tablet Take 1 tablet (1 mg total) by mouth daily. (Patient taking differently: Take 0.5 mg by mouth daily.) 90 tablet 0   furosemide  (LASIX ) 20 MG tablet Take 1 tablet (20 mg total) by mouth 2 (two) times a week. Tues & Thurs  (Patient taking differently: Take 20 mg by mouth daily.)     hydrocortisone  (ANUSOL -HC) 2.5 % rectal cream APPLY RECTALLY TO THE AFFECTED AREA 2 TO 3 TIMES A DAY AS NEEDED 30 g PRN   ipratropium-albuterol  (DUONEB) 0.5-2.5 (3) MG/3ML SOLN Take 3 mLs by nebulization 3 (three) times daily. 360 mL 0   levETIRAcetam  (KEPPRA ) 1000 MG tablet Take 1 tablet (1,000 mg total) by mouth 2 (two) times daily. 180 tablet 2   Magnesium  Glycinate 100 MG CAPS Take 1 capsule by mouth in the morning and at bedtime.     meclizine  (ANTIVERT ) 50 MG tablet Take 1 tablet (50 mg total) by mouth 3 (three) times daily. (Patient taking differently: Take 100 mg by mouth 3 (three) times daily. As needed) 90 tablet 2   metoprolol  succinate (TOPROL -XL) 25 MG 24 hr tablet Take 1 tablet (25 mg total) by mouth daily. 90 tablet 3   morphine  (MS CONTIN ) 15 MG 12  hr tablet Take 15 mg by mouth every 12 (twelve) hours.     Multiple Vitamin (MULTIVITAMIN WITH MINERALS) TABS tablet Take 2 tablets by mouth daily. Gummy     Nutritional Supplements (,FEEDING SUPPLEMENT, PROSOURCE PLUS) liquid Take 30 mLs by mouth 2 (two) times daily between meals. (Patient taking differently: Take 30 mLs by mouth 4 (four) times daily. Berry flavor)     OLANZapine  (ZYPREXA ) 2.5 MG tablet Take 1 tablet (2.5 mg total) by mouth at bedtime. 90 tablet 0   ondansetron  (ZOFRAN ) 4 MG tablet TAKE 1 TABLET(4 MG) BY MOUTH EVERY 8 HOURS AS NEEDED FOR NAUSEA OR VOMITING 40 tablet 2   oxyCODONE  (ROXICODONE ) 15 MG immediate release tablet Take 15 mg by mouth every 4 (four) hours as needed for pain.     pantoprazole  (PROTONIX ) 40 MG tablet TAKE 1 TABLET(40 MG) BY MOUTH DAILY 90 tablet 3   polyethylene glycol (MIRALAX  / GLYCOLAX ) 17 g packet Take 17 g by mouth daily. 14 each 0   senna-docusate (SENOKOT-S) 8.6-50 MG tablet Take 2 tablets by mouth daily as needed (Patient preference, Sennakot S for mild constipation). (Patient taking differently: Take 2 tablets by mouth daily as  needed for mild constipation. 2 tabs 2 x daily)     XTAMPZA  ER 13.5 MG C12A Take 1 capsule by mouth in the morning and at bedtime. (Patient not taking: Reported on 06/12/2024) 60 capsule 0   No current facility-administered medications for this visit.    REVIEW OF SYSTEMS:   Constitutional: ( - ) fevers, ( - )  chills , ( - ) night sweats Eyes: ( - ) blurriness of vision, ( - ) double vision, ( - ) watery eyes Ears, nose, mouth, throat, and face: ( - ) mucositis, ( - ) sore throat Respiratory: ( - ) cough, ( + ) dyspnea, ( - ) wheezes Cardiovascular: ( - ) palpitation, ( - ) chest discomfort, ( - ) lower extremity swelling Gastrointestinal:  ( + ) nausea, ( - ) heartburn, ( - ) change in bowel habits Skin: ( - ) abnormal skin rashes Lymphatics: ( - ) new lymphadenopathy, ( - ) easy bruising Neurological: ( - ) numbness, ( - ) tingling, ( - ) new weaknesses Behavioral/Psych: ( - ) mood change, ( - ) new changes  All other systems were reviewed with the patient and are negative.  PHYSICAL EXAMINATION: ECOG PERFORMANCE STATUS: 2 - Symptomatic, <50% confined to bed  Vitals:   06/12/24 1401  BP: 128/80  Pulse: 66  Resp: 13  Temp: 97.7 F (36.5 C)  SpO2: 100%    GENERAL:middle aged Caucasian female in NAD  SKIN: skin color, texture, turgor are normal, no rashes or significant lesions EYES: conjunctiva are pink and non-injected, sclera clear LUNGS: wheezing at lung bases. clear to auscultation and percussion with normal breathing effort HEART: regular rate & rhythm and no murmurs and bilateral trace pitting lower extremity edema Musculoskeletal: no cyanosis of digits and no clubbing PSYCH: alert & oriented x 3, fluent speech NEURO: no focal motor/sensory deficits  LABORATORY DATA:  I have reviewed the data as listed    Latest Ref Rng & Units 06/12/2024    1:24 PM 05/22/2024   10:48 AM 04/26/2024    4:33 PM  CBC  WBC 4.0 - 10.5 K/uL 7.2  5.3  9.1   Hemoglobin 12.0 - 15.0 g/dL  89.1  88.3  87.1   Hematocrit 36.0 - 46.0 % 33.0  35.1  38.4  Platelets 150 - 400 K/uL 230  225  214        Latest Ref Rng & Units 06/12/2024    1:24 PM 05/22/2024   10:48 AM 05/01/2024    4:05 AM  CMP  Glucose 70 - 99 mg/dL 91  863  897   BUN 8 - 23 mg/dL 20  27  26    Creatinine 0.44 - 1.00 mg/dL 9.13  9.03  9.01   Sodium 135 - 145 mmol/L 145  145  137   Potassium 3.5 - 5.1 mmol/L 3.8  3.1  3.6   Chloride 98 - 111 mmol/L 104  105  105   CO2 22 - 32 mmol/L 36  34  24   Calcium  8.9 - 10.3 mg/dL 9.3  9.4  8.6   Total Protein 6.5 - 8.1 g/dL 6.1  6.0    Total Bilirubin 0.0 - 1.2 mg/dL 0.3  0.3    Alkaline Phos 38 - 126 U/L 53  42    AST 15 - 41 U/L 13  11    ALT 0 - 44 U/L 12  9       RADIOGRAPHIC STUDIES: I personally have viewed the radiographic studies below: No results found.     ASSESSMENT & PLAN DELOROS BERETTA is a 68 y.o. female with medical history significant for metastatic adenocarcinoma of the lung presents for a follow up visit. 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   Previously we discussed Carboplatin /Pembrolizumab /Pemetrexed  and the expected side effect from these medications.  We discussed the possible side effects of immunotherapy including colitis, hepatitis, dermatitis, and pneumonitis.  Additionally we discussed the side effects of carboplatin  which include suppression of blood counts, nephrotoxicity, and nausea.  Additionally we discussed pemetrexed  which can also have effects on counts and would require supplementation with vitamin B12 and folic acid .  She voiced her understanding of these side effects and treatment plans moving forward.  I also noted that we would be able to drop the chemotherapy agents that she had intolerance to continue with pembrolizumab  alone given her high TPS score.  The patient has a markedly elevated TPS score (90%)   # Metastatic Adenocarcinoma of the Lung with MET Exon 14  Mutation --Current treatment includes Pem/Pem maintenance (Carboplatin  dropped after Cycle 4)  --Bevacizumab  was added from 01/26/2021-03/10/2021. Due to worsening dizziness, she resumed this from Cycle 25-27.  PLAN: --Due for Cycle 66 Day 1 of maintenance single agent Pembrolizumab  today.  --Labs from today WBC 7.2, Hgb 10.8, Plt 230, creatinine 0.86, LFTs in range.  --Last CT scan in 04/26/2024 showed no evidence of progressive disease. Next scan due around Nov 2025 --Proceed with treatment today without any dose modifications.  --RTC q 3 weeks for labs, follow up visit prior to Cycle 67.  #Brain Metastasis --appreciate the assistance of Dr. Buckley in treating her brain metastasis and symptoms. --radiation therapy to the brain completed on 11/16/2019.  --MRI brain on 04/29/2020 showed evidence of small brain bleed. Eliquis  therapy was discontinued.  --MRI on 11/04/2020 showed concern for progression of intracranial disease.  --On 12/02/2020, patient underwent craniotomy, resection of progressive cerebellar mass by Dr. Unice. 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  --MRI on 12/23/2020 showed evidence of radiation necrosis. Dr. Buckley evaluated the patient on 01/02/2021 with recommendations to add IV Bevacizumab  10 mg/kg q 3 weeks ( started 01/26/2021). Completed a full course of this therapy. --Patient was evaluated by  Dr. Buckley on 08/29/2021 and recommended to resume Bevacizumab  due to worsening dizziness.  Which was given from Cycle 25-27.  Plan: --continue to follow with Dr. Buckley  --Most recent MRI from 12/18/2023 showed evidence of intracranial bleeding.  Eliquis  held indefinitely.  --Next MRI brain scheduled for Jan 2026, will check with Dr. Federico if we need to move up the scan.   #Nausea/Vomiting, worsening --Regimen includes Zofran  4-8mg  q8h PRN and compazine  10mg  q6H for breakthrough PRN, Olanzpaine 2.5 mg PO QHS to help with nausea.  She is currently using Phenergan  for  breakthrough. --continue to monitor   #Hypokalemia --likely 2/2 to decreased PO intake, hypovolemia, and BP medications --Potassium level 3.8 today.   --Currently on potassium chloride  20 mEq twice daily, recommend to continue  #Worsening anemia: --Hgb trending down to 10.8.  --Will check iron/b12/folate levels  #Pain Control, stable --continue oxycodone  5mg  q4H PRN. Can increase dose to 10mg  q6H if pain is severe. She is taking approximately 3-4 pills per day.  --continue Xtampza  13.5 mg ER BID for long acting support.  --continue to take with Senokot to prevent opioid induced constipation. 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    # Hypertension: --Continue Norvasc  to 10 mg daily.  --continue to monitor at home and in clinic.    Orders Placed This Encounter  Procedures   Ferritin    Standing Status:   Future    Expiration Date:   06/12/2025   Iron and Iron Binding Capacity ([facility] only)    Standing Status:   Future    Expiration Date:   06/12/2025   Vitamin B12    Standing Status:   Future    Expiration Date:   06/12/2025   Folate, Serum    Standing Status:   Future    Expiration Date:   06/12/2025    All questions were answered. The patient knows to call the clinic with any problems, questions or concerns.  I have spent a total of 30 minutes minutes of face-to-face and non-face-to-face time, preparing to see the patient, performing a medically appropriate examination, counseling and educating the patient,  documenting clinical information in the electronic health record, and care coordination.   Johnston Police [name] Dept of Hematology and Oncology [facility] Phone: [phone]    06/15/2024 3:02 PM   Literature Support:  Lucienne CROME, Rodrguez-Abreu D, Gadgeel S, Esteban E,  Felip E, [place] F, Domine M, Clingan P, Hochmair MJ, [place], [place], Bischoff HG, Peled N, Grossi F, Jennens RR, Reck M, [place], [place], [place], Rubio-Viqueira B, Novello S, Kurata T, Gray JE, Vida J, Wei Z, Yang J, Raftopoulos H, [place], [place] [employer]; [id number] Investigators. Pembrolizumab  plus Chemotherapy in Metastatic Non-Small-Cell Lung Cancer. LOISE Diedra PARAS Med. 2018 May 31;378(22):2078-2092.  --Median progression-free survival was 8.8 months (95% CI, 7.6 to 9.2) in the pembrolizumab -combination group and 4.9 months (95% CI, 4.7 to 5.5) in the placebo-combination group (hazard ratio for disease progression or death, 0.52; 95% CI, 0.43 to 0.64; P<0.001). Adverse events of grade 3 or higher occurred in 67.2% of the patients in the pembrolizumab -combination group and in 65.8% of those in the placebo-combination group.  Missy KATHEE Diona CROME Cesario OLEGARIO Patt L. Efficacy of pemetrexed -based regimens in advanced non-small cell lung cancer patients with activating epidermal growth  factor receptor mutations after tyrosine kinase inhibitor failure: a systematic review. Onco Targets Ther. 2018;11:2121-2129.   --The weighted median PFS, median OS, and ORR for patients treated with pem regimens were 5.09 months, 15.91 months, and 30.19%, respectively. Our systematic review results showed a favorable efficacy profile of pem regimens in NSCLC patients with EGFR mutation after EGFR-TKI failure.

## 2024-06-12 ENCOUNTER — Inpatient Hospital Stay (HOSPITAL_BASED_OUTPATIENT_CLINIC_OR_DEPARTMENT_OTHER): Admitting: Physician Assistant

## 2024-06-12 ENCOUNTER — Inpatient Hospital Stay

## 2024-06-12 ENCOUNTER — Inpatient Hospital Stay: Attending: Hematology and Oncology

## 2024-06-12 VITALS — BP 128/80 | HR 66 | Temp 97.7°F | Resp 13

## 2024-06-12 DIAGNOSIS — Z90710 Acquired absence of both cervix and uterus: Secondary | ICD-10-CM | POA: Insufficient documentation

## 2024-06-12 DIAGNOSIS — E7800 Pure hypercholesterolemia, unspecified: Secondary | ICD-10-CM | POA: Insufficient documentation

## 2024-06-12 DIAGNOSIS — R609 Edema, unspecified: Secondary | ICD-10-CM | POA: Insufficient documentation

## 2024-06-12 DIAGNOSIS — Z7901 Long term (current) use of anticoagulants: Secondary | ICD-10-CM | POA: Insufficient documentation

## 2024-06-12 DIAGNOSIS — F419 Anxiety disorder, unspecified: Secondary | ICD-10-CM | POA: Insufficient documentation

## 2024-06-12 DIAGNOSIS — C7931 Secondary malignant neoplasm of brain: Secondary | ICD-10-CM | POA: Insufficient documentation

## 2024-06-12 DIAGNOSIS — Z79620 Long term (current) use of immunosuppressive biologic: Secondary | ICD-10-CM | POA: Insufficient documentation

## 2024-06-12 DIAGNOSIS — E032 Hypothyroidism due to medicaments and other exogenous substances: Secondary | ICD-10-CM

## 2024-06-12 DIAGNOSIS — D649 Anemia, unspecified: Secondary | ICD-10-CM | POA: Diagnosis not present

## 2024-06-12 DIAGNOSIS — Z5112 Encounter for antineoplastic immunotherapy: Secondary | ICD-10-CM | POA: Insufficient documentation

## 2024-06-12 DIAGNOSIS — Z79899 Other long term (current) drug therapy: Secondary | ICD-10-CM | POA: Insufficient documentation

## 2024-06-12 DIAGNOSIS — C7951 Secondary malignant neoplasm of bone: Secondary | ICD-10-CM | POA: Diagnosis not present

## 2024-06-12 DIAGNOSIS — Z9049 Acquired absence of other specified parts of digestive tract: Secondary | ICD-10-CM | POA: Diagnosis not present

## 2024-06-12 DIAGNOSIS — G939 Disorder of brain, unspecified: Secondary | ICD-10-CM | POA: Diagnosis not present

## 2024-06-12 DIAGNOSIS — C3490 Malignant neoplasm of unspecified part of unspecified bronchus or lung: Secondary | ICD-10-CM | POA: Diagnosis not present

## 2024-06-12 DIAGNOSIS — M899 Disorder of bone, unspecified: Secondary | ICD-10-CM | POA: Insufficient documentation

## 2024-06-12 DIAGNOSIS — Z86718 Personal history of other venous thrombosis and embolism: Secondary | ICD-10-CM | POA: Diagnosis not present

## 2024-06-12 DIAGNOSIS — C3431 Malignant neoplasm of lower lobe, right bronchus or lung: Secondary | ICD-10-CM | POA: Insufficient documentation

## 2024-06-12 DIAGNOSIS — I82402 Acute embolism and thrombosis of unspecified deep veins of left lower extremity: Secondary | ICD-10-CM | POA: Insufficient documentation

## 2024-06-12 DIAGNOSIS — E876 Hypokalemia: Secondary | ICD-10-CM | POA: Insufficient documentation

## 2024-06-12 DIAGNOSIS — R918 Other nonspecific abnormal finding of lung field: Secondary | ICD-10-CM

## 2024-06-12 DIAGNOSIS — Z8673 Personal history of transient ischemic attack (TIA), and cerebral infarction without residual deficits: Secondary | ICD-10-CM | POA: Insufficient documentation

## 2024-06-12 LAB — CMP (CANCER CENTER ONLY)
ALT: 12 U/L (ref 0–44)
AST: 13 U/L — ABNORMAL LOW (ref 15–41)
Albumin: 3.1 g/dL — ABNORMAL LOW (ref 3.5–5.0)
Alkaline Phosphatase: 53 U/L (ref 38–126)
Anion gap: 5 (ref 5–15)
BUN: 20 mg/dL (ref 8–23)
CO2: 36 mmol/L — ABNORMAL HIGH (ref 22–32)
Calcium: 9.3 mg/dL (ref 8.9–10.3)
Chloride: 104 mmol/L (ref 98–111)
Creatinine: 0.86 mg/dL (ref 0.44–1.00)
GFR, Estimated: >60 mL/min (ref >60)
Glucose, Bld: 91 mg/dL (ref 70–99)
Potassium: 3.8 mmol/L (ref 3.5–5.1)
Sodium: 145 mmol/L (ref 135–145)
Total Bilirubin: 0.3 mg/dL (ref 0.0–1.2)
Total Protein: 6.1 g/dL — ABNORMAL LOW (ref 6.5–8.1)

## 2024-06-12 LAB — CBC WITH DIFFERENTIAL (CANCER CENTER ONLY)
Abs Immature Granulocytes: 0.06 K/uL (ref 0.00–0.07)
Basophils Absolute: 0.1 K/uL (ref 0.0–0.1)
Basophils Relative: 1 %
Eosinophils Absolute: 0.2 K/uL (ref 0.0–0.5)
Eosinophils Relative: 3 %
HCT: 33 % — ABNORMAL LOW (ref 36.0–46.0)
Hemoglobin: 10.8 g/dL — ABNORMAL LOW (ref 12.0–15.0)
Immature Granulocytes: 1 %
Lymphocytes Relative: 26 %
Lymphs Abs: 1.9 K/uL (ref 0.7–4.0)
MCH: 31.1 pg (ref 26.0–34.0)
MCHC: 32.7 g/dL (ref 30.0–36.0)
MCV: 95.1 fL (ref 80.0–100.0)
Monocytes Absolute: 0.7 K/uL (ref 0.1–1.0)
Monocytes Relative: 10 %
Neutro Abs: 4.3 K/uL (ref 1.7–7.7)
Neutrophils Relative %: 59 %
Platelet Count: 230 K/uL (ref 150–400)
RBC: 3.47 MIL/uL — ABNORMAL LOW (ref 3.87–5.11)
RDW: 12.3 % (ref 11.5–15.5)
WBC Count: 7.2 K/uL (ref 4.0–10.5)
nRBC: 0 % (ref 0.0–0.2)

## 2024-06-12 LAB — TSH: TSH: 2.08 u[IU]/mL (ref 0.350–4.500)

## 2024-06-12 MED ORDER — SODIUM CHLORIDE 0.9 % IV SOLN
200.0000 mg | Freq: Once | INTRAVENOUS | Status: AC
  Administered 2024-06-12: 200 mg via INTRAVENOUS
  Filled 2024-06-12: qty 200

## 2024-06-12 MED ORDER — PROCHLORPERAZINE MALEATE 10 MG PO TABS
10.0000 mg | ORAL_TABLET | Freq: Once | ORAL | Status: AC
  Administered 2024-06-12: 10 mg via ORAL
  Filled 2024-06-12: qty 1

## 2024-06-12 MED ORDER — SODIUM CHLORIDE 0.9 % IV SOLN: Freq: Once | INTRAVENOUS | Status: AC

## 2024-06-12 NOTE — Patient Instructions (Addendum)
 [facility] - A DEPT OF [facility]  Discharge Instructions: Thank you for choosing [facility] to provide your oncology and hematology care.   If you have a lab appointment with the Cancer Center, please go directly to the Cancer Center and check in at the registration area.   Wear comfortable clothing and clothing appropriate for easy access to any Portacath or PICC line.   We strive to give you quality time with your provider. You may need to reschedule your appointment if you arrive late (15 or more minutes).  Arriving late affects you and other patients whose appointments are after yours.  Also, if you miss three or more appointments without notifying the office, you may be dismissed from the clinic at the provider's discretion.      For prescription refill requests, have your pharmacy contact our office and allow 72 hours for refills to be completed.    Today you received the following chemotherapy and/or immunotherapy agent: Pembrolizumab (Keytruda)   To help prevent nausea and vomiting after your treatment, we encourage you to take your nausea medication as directed.  BELOW ARE SYMPTOMS THAT SHOULD BE REPORTED IMMEDIATELY: *FEVER GREATER THAN 100.4 F (38 C) OR HIGHER *CHILLS OR SWEATING *NAUSEA AND VOMITING THAT IS NOT CONTROLLED WITH YOUR NAUSEA MEDICATION *UNUSUAL SHORTNESS OF BREATH *UNUSUAL BRUISING OR BLEEDING *URINARY PROBLEMS (pain or burning when urinating, or frequent urination) *BOWEL PROBLEMS (unusual diarrhea, constipation, pain near the anus) TENDERNESS IN MOUTH AND THROAT WITH OR WITHOUT PRESENCE OF ULCERS (sore throat, sores in mouth, or a toothache) UNUSUAL RASH, SWELLING OR PAIN  UNUSUAL VAGINAL DISCHARGE OR ITCHING   Items with * indicate a potential emergency and should be followed up as soon as possible or go to the Emergency Department if any problems should occur.  Please show the CHEMOTHERAPY ALERT CARD or  IMMUNOTHERAPY ALERT CARD at check-in to the Emergency Department and triage nurse.  Should you have questions after your visit or need to cancel or reschedule your appointment, please contact [facility] - A DEPT OF [facility]  Dept: [phone]  and follow the prompts.  Office hours are 8:00 a.m. to 4:30 p.m. Monday - Friday. Please note that voicemails left after 4:00 p.m. may not be returned until the following business day.  We are closed weekends and major holidays. You have access to a nurse at all times for urgent questions. Please call the main number to the clinic Dept: [phone] and follow the prompts.   For any non-urgent questions, you may also contact your provider using MyChart. We now offer e-Visits for anyone 6 and older to request care online for non-urgent symptoms. For details visit [email or web address].   Also download the MyChart app! Go to the app store, search "MyChart", open the app, select , and log in with your MyChart username and password.

## 2024-06-13 ENCOUNTER — Encounter: Payer: Self-pay | Admitting: Physician Assistant

## 2024-06-13 LAB — T4: T4, Total: 7.7 ug/dL (ref 4.5–12.0)

## 2024-06-15 ENCOUNTER — Encounter: Payer: Self-pay | Admitting: Hematology and Oncology

## 2024-06-17 ENCOUNTER — Observation Stay (HOSPITAL_COMMUNITY)
Admission: EM | Admit: 2024-06-17 | Discharge: 2024-06-19 | Disposition: A | Discharge status: Home / Self Care | Source: Skilled Nursing Facility | Attending: Emergency Medicine | Admitting: Emergency Medicine

## 2024-06-17 ENCOUNTER — Emergency Department (HOSPITAL_COMMUNITY)

## 2024-06-17 DIAGNOSIS — J181 Lobar pneumonia, unspecified organism: Secondary | ICD-10-CM | POA: Insufficient documentation

## 2024-06-17 DIAGNOSIS — M545 Low back pain, unspecified: Secondary | ICD-10-CM | POA: Diagnosis not present

## 2024-06-17 DIAGNOSIS — Z7951 Long term (current) use of inhaled steroids: Secondary | ICD-10-CM | POA: Insufficient documentation

## 2024-06-17 DIAGNOSIS — Z86718 Personal history of other venous thrombosis and embolism: Secondary | ICD-10-CM | POA: Insufficient documentation

## 2024-06-17 DIAGNOSIS — I3139 Other pericardial effusion (noninflammatory): Secondary | ICD-10-CM | POA: Diagnosis not present

## 2024-06-17 DIAGNOSIS — I10 Essential (primary) hypertension: Secondary | ICD-10-CM | POA: Diagnosis present

## 2024-06-17 DIAGNOSIS — I2510 Atherosclerotic heart disease of native coronary artery without angina pectoris: Secondary | ICD-10-CM | POA: Insufficient documentation

## 2024-06-17 DIAGNOSIS — R609 Edema, unspecified: Secondary | ICD-10-CM | POA: Diagnosis not present

## 2024-06-17 DIAGNOSIS — G40909 Epilepsy, unspecified, not intractable, without status epilepticus: Secondary | ICD-10-CM | POA: Insufficient documentation

## 2024-06-17 DIAGNOSIS — J9611 Chronic respiratory failure with hypoxia: Secondary | ICD-10-CM | POA: Insufficient documentation

## 2024-06-17 DIAGNOSIS — R5381 Other malaise: Secondary | ICD-10-CM | POA: Insufficient documentation

## 2024-06-17 DIAGNOSIS — R059 Cough, unspecified: Secondary | ICD-10-CM | POA: Diagnosis present

## 2024-06-17 DIAGNOSIS — C7951 Secondary malignant neoplasm of bone: Secondary | ICD-10-CM | POA: Insufficient documentation

## 2024-06-17 DIAGNOSIS — I5022 Chronic systolic (congestive) heart failure: Secondary | ICD-10-CM | POA: Insufficient documentation

## 2024-06-17 DIAGNOSIS — K5900 Constipation, unspecified: Secondary | ICD-10-CM | POA: Insufficient documentation

## 2024-06-17 DIAGNOSIS — Z85118 Personal history of other malignant neoplasm of bronchus and lung: Secondary | ICD-10-CM | POA: Diagnosis not present

## 2024-06-17 DIAGNOSIS — J189 Pneumonia, unspecified organism: Principal | ICD-10-CM

## 2024-06-17 DIAGNOSIS — J209 Acute bronchitis, unspecified: Secondary | ICD-10-CM | POA: Diagnosis not present

## 2024-06-17 DIAGNOSIS — E118 Type 2 diabetes mellitus with unspecified complications: Secondary | ICD-10-CM | POA: Diagnosis present

## 2024-06-17 DIAGNOSIS — I110 Hypertensive heart disease with heart failure: Secondary | ICD-10-CM | POA: Diagnosis not present

## 2024-06-17 DIAGNOSIS — G8929 Other chronic pain: Secondary | ICD-10-CM | POA: Insufficient documentation

## 2024-06-17 DIAGNOSIS — Z87891 Personal history of nicotine dependence: Secondary | ICD-10-CM | POA: Insufficient documentation

## 2024-06-17 DIAGNOSIS — R7989 Other specified abnormal findings of blood chemistry: Secondary | ICD-10-CM | POA: Diagnosis not present

## 2024-06-17 DIAGNOSIS — R627 Adult failure to thrive: Secondary | ICD-10-CM | POA: Diagnosis not present

## 2024-06-17 DIAGNOSIS — R5383 Other fatigue: Secondary | ICD-10-CM | POA: Diagnosis not present

## 2024-06-17 DIAGNOSIS — C7931 Secondary malignant neoplasm of brain: Secondary | ICD-10-CM | POA: Insufficient documentation

## 2024-06-17 DIAGNOSIS — R0989 Other specified symptoms and signs involving the circulatory and respiratory systems: Secondary | ICD-10-CM | POA: Insufficient documentation

## 2024-06-17 DIAGNOSIS — F419 Anxiety disorder, unspecified: Secondary | ICD-10-CM | POA: Diagnosis not present

## 2024-06-17 DIAGNOSIS — C3490 Malignant neoplasm of unspecified part of unspecified bronchus or lung: Secondary | ICD-10-CM | POA: Diagnosis present

## 2024-06-17 DIAGNOSIS — E66811 Obesity, class 1: Secondary | ICD-10-CM | POA: Insufficient documentation

## 2024-06-17 DIAGNOSIS — I700 Atherosclerosis of aorta: Secondary | ICD-10-CM | POA: Insufficient documentation

## 2024-06-17 DIAGNOSIS — J40 Bronchitis, not specified as acute or chronic: Secondary | ICD-10-CM | POA: Diagnosis present

## 2024-06-17 DIAGNOSIS — K219 Gastro-esophageal reflux disease without esophagitis: Secondary | ICD-10-CM | POA: Insufficient documentation

## 2024-06-17 DIAGNOSIS — Z6834 Body mass index (BMI) 34.0-34.9, adult: Secondary | ICD-10-CM | POA: Insufficient documentation

## 2024-06-17 DIAGNOSIS — E119 Type 2 diabetes mellitus without complications: Secondary | ICD-10-CM | POA: Diagnosis not present

## 2024-06-17 DIAGNOSIS — I5020 Unspecified systolic (congestive) heart failure: Secondary | ICD-10-CM | POA: Diagnosis present

## 2024-06-17 DIAGNOSIS — R110 Nausea: Secondary | ICD-10-CM | POA: Insufficient documentation

## 2024-06-17 DIAGNOSIS — Z79899 Other long term (current) drug therapy: Secondary | ICD-10-CM | POA: Insufficient documentation

## 2024-06-17 LAB — COMPREHENSIVE METABOLIC PANEL WITH GFR
ALT: 13 U/L (ref 0–44)
AST: 15 U/L (ref 15–41)
Albumin: 3.4 g/dL — ABNORMAL LOW (ref 3.5–5.0)
Alkaline Phosphatase: 56 U/L (ref 38–126)
Anion gap: 8 (ref 5–15)
BUN: 21 mg/dL (ref 8–23)
CO2: 33 mmol/L — ABNORMAL HIGH (ref 22–32)
Calcium: 9.7 mg/dL (ref 8.9–10.3)
Chloride: 102 mmol/L (ref 98–111)
Creatinine, Ser: 0.92 mg/dL (ref 0.44–1.00)
GFR, Estimated: >60 mL/min (ref >60)
Glucose, Bld: 118 mg/dL — ABNORMAL HIGH (ref 70–99)
Potassium: 3.6 mmol/L (ref 3.5–5.1)
Sodium: 144 mmol/L (ref 135–145)
Total Bilirubin: 0.3 mg/dL (ref 0.0–1.2)
Total Protein: 5.7 g/dL — ABNORMAL LOW (ref 6.5–8.1)

## 2024-06-17 LAB — URINALYSIS, ROUTINE W REFLEX MICROSCOPIC
Bilirubin Urine: NEGATIVE
Glucose, UA: NEGATIVE mg/dL
Hgb urine dipstick: NEGATIVE
Ketones, ur: NEGATIVE mg/dL
Leukocytes,Ua: NEGATIVE
Nitrite: NEGATIVE
Protein, ur: NEGATIVE mg/dL
Specific Gravity, Urine: 1.013 (ref 1.005–1.030)
pH: 7 (ref 5.0–8.0)

## 2024-06-17 LAB — CBC WITH DIFFERENTIAL/PLATELET
Abs Immature Granulocytes: 0.04 K/uL (ref 0.00–0.07)
Basophils Absolute: 0 K/uL (ref 0.0–0.1)
Basophils Relative: 0 %
Eosinophils Absolute: 0.2 K/uL (ref 0.0–0.5)
Eosinophils Relative: 2 %
HCT: 35.3 % — ABNORMAL LOW (ref 36.0–46.0)
Hemoglobin: 10.9 g/dL — ABNORMAL LOW (ref 12.0–15.0)
Immature Granulocytes: 1 %
Lymphocytes Relative: 20 %
Lymphs Abs: 1.7 K/uL (ref 0.7–4.0)
MCH: 30.5 pg (ref 26.0–34.0)
MCHC: 30.9 g/dL (ref 30.0–36.0)
MCV: 98.9 fL (ref 80.0–100.0)
Monocytes Absolute: 0.8 K/uL (ref 0.1–1.0)
Monocytes Relative: 9 %
Neutro Abs: 5.8 K/uL (ref 1.7–7.7)
Neutrophils Relative %: 68 %
Platelets: 223 K/uL (ref 150–400)
RBC: 3.57 MIL/uL — ABNORMAL LOW (ref 3.87–5.11)
RDW: 12.5 % (ref 11.5–15.5)
WBC: 8.5 K/uL (ref 4.0–10.5)
nRBC: 0 % (ref 0.0–0.2)

## 2024-06-17 LAB — BLOOD GAS, VENOUS
Acid-Base Excess: 6.1 mmol/L — ABNORMAL HIGH (ref 0.0–2.0)
Bicarbonate: 33.7 mmol/L — ABNORMAL HIGH (ref 20.0–28.0)
O2 Saturation: 44.3 %
Patient temperature: 37
pCO2, Ven: 61 mmHg — ABNORMAL HIGH (ref 44–60)
pH, Ven: 7.35 (ref 7.25–7.43)
pO2, Ven: <31 mmHg — CL (ref 32–45)

## 2024-06-17 LAB — TROPONIN T, HIGH SENSITIVITY
Troponin T High Sensitivity: 53 ng/L — ABNORMAL HIGH (ref 0–19)
Troponin T High Sensitivity: 50 ng/L — ABNORMAL HIGH (ref 0–19)

## 2024-06-17 LAB — LACTIC ACID, PLASMA: Lactic Acid, Venous: 1.1 mmol/L (ref 0.5–1.9)

## 2024-06-17 LAB — LIPASE, BLOOD: Lipase: 21 U/L (ref 11–51)

## 2024-06-17 LAB — PRO BRAIN NATRIURETIC PEPTIDE: Pro Brain Natriuretic Peptide: 386 pg/mL — ABNORMAL HIGH (ref <300.0)

## 2024-06-17 MED ORDER — ONDANSETRON HCL 4 MG/2ML IJ SOLN: 4.0000 mg | Freq: Four times a day (QID) | INTRAMUSCULAR | Status: DC | PRN

## 2024-06-17 MED ORDER — SODIUM CHLORIDE 0.9 % IV SOLN
2.0000 g | Freq: Once | INTRAVENOUS | Status: AC
  Administered 2024-06-17: 2 g via INTRAVENOUS
  Filled 2024-06-17: qty 12.5

## 2024-06-17 MED ORDER — AZITHROMYCIN 500 MG IV SOLR
500.0000 mg | Freq: Once | INTRAVENOUS | Status: AC
  Administered 2024-06-17: 500 mg via INTRAVENOUS
  Filled 2024-06-17: qty 5

## 2024-06-17 MED ORDER — MORPHINE SULFATE ER 15 MG PO TBCR
15.0000 mg | EXTENDED_RELEASE_TABLET | Freq: Two times a day (BID) | ORAL | Status: DC
  Administered 2024-06-17 – 2024-06-18 (×3): 15 mg via ORAL
  Filled 2024-06-17 (×3): qty 1

## 2024-06-17 MED ORDER — SENNOSIDES-DOCUSATE SODIUM 8.6-50 MG PO TABS
2.0000 | ORAL_TABLET | Freq: Every day | ORAL | Status: DC | PRN
Start: 2024-06-17 — End: 2024-06-19

## 2024-06-17 MED ORDER — OXYCODONE HCL 5 MG PO TABS
5.0000 mg | ORAL_TABLET | ORAL | Status: DC | PRN
  Administered 2024-06-17 – 2024-06-18 (×2): 5 mg via ORAL
  Filled 2024-06-17 (×2): qty 1

## 2024-06-17 MED ORDER — SODIUM CHLORIDE 0.9 % IV SOLN
500.0000 mg | INTRAVENOUS | Status: DC
  Administered 2024-06-18: 500 mg via INTRAVENOUS
  Filled 2024-06-17: qty 5

## 2024-06-17 MED ORDER — SODIUM CHLORIDE 0.9 % IV SOLN
2.0000 g | Freq: Three times a day (TID) | INTRAVENOUS | Status: DC
  Administered 2024-06-18 – 2024-06-19 (×4): 2 g via INTRAVENOUS
  Filled 2024-06-17 (×4): qty 12.5

## 2024-06-17 MED ORDER — ONDANSETRON HCL 4 MG PO TABS: 4.0000 mg | ORAL_TABLET | Freq: Four times a day (QID) | ORAL | Status: DC | PRN

## 2024-06-17 MED ORDER — METOPROLOL SUCCINATE ER 25 MG PO TB24
25.0000 mg | ORAL_TABLET | Freq: Every day | ORAL | Status: DC
Start: 2024-06-18 — End: 2024-06-19
  Administered 2024-06-18: 25 mg via ORAL
  Filled 2024-06-17 (×2): qty 1

## 2024-06-17 MED ORDER — ALBUTEROL SULFATE HFA 108 (90 BASE) MCG/ACT IN AERS: 2.0000 | INHALATION_SPRAY | Freq: Four times a day (QID) | RESPIRATORY_TRACT | Status: DC | PRN

## 2024-06-17 MED ORDER — DEXAMETHASONE 0.5 MG PO TABS
0.5000 mg | ORAL_TABLET | Freq: Every day | ORAL | Status: DC
Start: 2024-06-18 — End: 2024-06-19
  Filled 2024-06-17 (×2): qty 1

## 2024-06-17 MED ORDER — LEVETIRACETAM 500 MG PO TABS
1000.0000 mg | ORAL_TABLET | Freq: Two times a day (BID) | ORAL | Status: DC
  Administered 2024-06-17 – 2024-06-18 (×3): 1000 mg via ORAL
  Filled 2024-06-17 (×4): qty 2

## 2024-06-17 MED ORDER — ACETAMINOPHEN 650 MG RE SUPP: 650.0000 mg | Freq: Four times a day (QID) | RECTAL | Status: DC | PRN

## 2024-06-17 MED ORDER — ALBUTEROL SULFATE (2.5 MG/3ML) 0.083% IN NEBU: 2.5000 mg | INHALATION_SOLUTION | Freq: Four times a day (QID) | RESPIRATORY_TRACT | Status: DC | PRN

## 2024-06-17 MED ORDER — OLANZAPINE 5 MG PO TABS
2.5000 mg | ORAL_TABLET | Freq: Every day | ORAL | Status: DC
Start: 2024-06-17 — End: 2024-06-19
  Administered 2024-06-17 – 2024-06-18 (×2): 2.5 mg via ORAL
  Filled 2024-06-17 (×2): qty 1

## 2024-06-17 MED ORDER — POLYETHYLENE GLYCOL 3350 17 G PO PACK
17.0000 g | PACK | Freq: Every day | ORAL | Status: DC
  Administered 2024-06-18: 17 g via ORAL
  Filled 2024-06-17 (×2): qty 1

## 2024-06-17 MED ORDER — PANTOPRAZOLE SODIUM 40 MG PO TBEC
40.0000 mg | DELAYED_RELEASE_TABLET | Freq: Every day | ORAL | Status: DC
  Administered 2024-06-18: 40 mg via ORAL
  Filled 2024-06-17 (×2): qty 1

## 2024-06-17 MED ORDER — OXYCODONE HCL 5 MG PO TABS: 15.0000 mg | ORAL_TABLET | ORAL | Status: DC | PRN

## 2024-06-17 MED ORDER — HYDROCORTISONE (PERIANAL) 2.5 % EX CREA: TOPICAL_CREAM | Freq: Every day | CUTANEOUS | Status: DC | PRN

## 2024-06-17 MED ORDER — IPRATROPIUM-ALBUTEROL 0.5-2.5 (3) MG/3ML IN SOLN
3.0000 mL | Freq: Three times a day (TID) | RESPIRATORY_TRACT | Status: DC
  Administered 2024-06-17 – 2024-06-19 (×5): 3 mL via RESPIRATORY_TRACT
  Filled 2024-06-17 (×5): qty 3

## 2024-06-17 MED ORDER — ALPRAZOLAM 0.25 MG PO TABS: 0.2500 mg | ORAL_TABLET | Freq: Two times a day (BID) | ORAL | Status: DC | PRN

## 2024-06-17 MED ORDER — FUROSEMIDE 20 MG PO TABS
20.0000 mg | ORAL_TABLET | Freq: Every day | ORAL | Status: DC
  Administered 2024-06-18: 20 mg via ORAL
  Filled 2024-06-17 (×2): qty 1

## 2024-06-17 MED ORDER — ACETAMINOPHEN 325 MG PO TABS: 650.0000 mg | ORAL_TABLET | Freq: Four times a day (QID) | ORAL | Status: DC | PRN

## 2024-06-17 NOTE — ED Provider Notes (Signed)
 [place] EMERGENCY DEPARTMENT AT [facility] Provider Note   CSN: [id number] Arrival date & time: 06/17/24  1405     Patient presents with: facility evaluation request    Virginia [name] is a 68 y.o. female.  HPI  Patient is a 68 year old female presenting today for concerns for worsening cough with Jame wishing for her to undergo repeat chest x-ray with previous chest x-ray reporting CHF with pleural effusions at that time.  Previously diagnosed with multifocal pneumonia in August admitted to the hospital, having chronic respiratory failure at the time and was placed on 2 L O2.    Also endorsing low back pain that is intermittent and worsening, also endorsing mild generalized abdominal pain with no localization.  Patient also noted that she recently had a rhinovirus recently approximately 2 weeks ago.  Previous medical history of lung cancer with brain metastases, type 2 diabetes, anemia, DVT, heart failure with reduced EF, respiratory failure on long-term oxygen 2 L.  Denies fever, headache, vision changes, dysphagia, odynophagia, chest pain, worsening shortness of breath, nausea, vomiting, diarrhea, melena, hematochezia, dysuria.  Prior to Admission medications   Medication Sig Start Date End Date Taking? Authorizing Provider  acetaminophen  (TYLENOL ) 325 MG tablet Take 1-2 tablets (325-650 mg total) by mouth every 4 (four) hours as needed for mild pain (pain score 1-3). Patient taking differently: Take 500 mg by mouth every 4 (four) hours as needed for mild pain (pain score 1-3). 07/01/23   Setzer, Sandra J, [name]  albuterol  (VENTOLIN  HFA) 108 (90 Base) MCG/ACT inhaler INHALE 2 PUFFS INTO THE LUNGS EVERY 6 HOURS AS NEEDED FOR WHEEZING OR SHORTNESS OF BREATH 12/09/23   Perri Ronal PARAS, MD  ALPRAZolam  (XANAX ) 0.25 MG tablet Take 1 tablet (0.25 mg total) by mouth 2 (two) times daily as needed for anxiety. 05/01/24   Regalado, Belkys A, MD  Cholecalciferol  (VITAMIN  D3) 125 MCG (5000 UT) TABS Take 5,000 Units by mouth every evening.    [provider]  colchicine  0.6 MG tablet TAKE 1 TABLET(0.6 MG) BY MOUTH DAILY 06/11/24   Dick, Ernest H Jr., NP  Cyanocobalamin  (VITAMIN B12 PO) Take 5,000 mcg by mouth daily. Gummies    [provider]  dexamethasone  (DECADRON ) 1 MG tablet Take 1 tablet (1 mg total) by mouth daily. Patient taking differently: Take 0.5 mg by mouth daily. 01/27/24   Vaslow, Zachary K, MD  furosemide  (LASIX ) 20 MG tablet Take 1 tablet (20 mg total) by mouth 2 (two) times a week. Tues & Thurs Patient taking differently: Take 20 mg by mouth daily. 05/04/24 08/02/24  Regalado, Belkys A, MD  hydrocortisone  (ANUSOL -HC) 2.5 % rectal cream APPLY RECTALLY TO THE AFFECTED AREA 2 TO 3 TIMES A DAY AS NEEDED 05/28/24   Perri Ronal PARAS, MD  ipratropium-albuterol  (DUONEB) 0.5-2.5 (3) MG/3ML SOLN Take 3 mLs by nebulization 3 (three) times daily. 05/01/24   Regalado, Belkys A, MD  levETIRAcetam  (KEPPRA ) 1000 MG tablet Take 1 tablet (1,000 mg total) by mouth 2 (two) times daily. 05/01/24   Regalado, Belkys A, MD  Magnesium  Glycinate 100 MG CAPS Take 1 capsule by mouth in the morning and at bedtime.    [provider]  meclizine  (ANTIVERT ) 50 MG tablet Take 1 tablet (50 mg total) by mouth 3 (three) times daily. Patient taking differently: Take 100 mg by mouth 3 (three) times daily. As needed 07/29/23   Vaslow, Zachary K, MD  metoprolol  succinate (TOPROL -XL) 25 MG 24 hr tablet Take 1 tablet (  25 mg total) by mouth daily. 07/26/23   Jerilynn Lamarr HERO, NP  morphine  (MS CONTIN ) 15 MG 12 hr tablet Take 15 mg by mouth every 12 (twelve) hours.    [provider]  Multiple Vitamin (MULTIVITAMIN WITH MINERALS) TABS tablet Take 2 tablets by mouth daily. Gummy    [provider]  Nutritional Supplements (,FEEDING SUPPLEMENT, PROSOURCE PLUS) liquid Take 30 mLs by mouth 2 (two) times daily between meals. Patient taking differently: Take 30  mLs by mouth 4 (four) times daily. Berry flavor 07/01/23   Setzer, Nena PARAS, [name]  OLANZapine  (ZYPREXA ) 2.5 MG tablet Take 1 tablet (2.5 mg total) by mouth at bedtime. 05/01/24   Regalado, Belkys A, MD  ondansetron  (ZOFRAN ) 4 MG tablet TAKE 1 TABLET(4 MG) BY MOUTH EVERY 8 HOURS AS NEEDED FOR NAUSEA OR VOMITING 01/30/23   Dorsey, John T IV, MD  oxyCODONE  (ROXICODONE ) 15 MG immediate release tablet Take 15 mg by mouth every 4 (four) hours as needed for pain.    [provider]  pantoprazole  (PROTONIX ) 40 MG tablet TAKE 1 TABLET(40 MG) BY MOUTH DAILY 09/12/23   Perri Ronal PARAS, MD  polyethylene glycol (MIRALAX  / GLYCOLAX ) 17 g packet Take 17 g by mouth daily. 05/02/24   Regalado, Belkys A, MD  senna-docusate (SENOKOT-S) 8.6-50 MG tablet Take 2 tablets by mouth daily as needed (Patient preference, Sennakot S for mild constipation). Patient taking differently: Take 2 tablets by mouth daily as needed for mild constipation. 2 tabs 2 x daily 07/01/23   Setzer, Sandra J, [name]  XTAMPZA  ER 13.5 MG C12A Take 1 capsule by mouth in the morning and at bedtime. Patient not taking: Reported on 06/12/2024 04/16/24   Pickenpack-Cousar, Fannie SAILOR, NP    Allergies: Scopolamine     Review of Systems  Updated Vital Signs BP 122/75   Pulse 67   Temp 98.2 F (36.8 C)   Resp 15   Ht 5' 2 (1.575 m)   Wt 85.2 kg   SpO2 100%   BMI 34.35 kg/m   Physical Exam Vitals and nursing note reviewed.  Constitutional:      General: She is not in acute distress.    Appearance: Normal appearance. She is not ill-appearing or diaphoretic.  HENT:     Head: Normocephalic and atraumatic.     Mouth/Throat:     Mouth: Mucous membranes are moist.     Pharynx: Oropharynx is clear.  Eyes:     General: No scleral icterus.       Right eye: No discharge.        Left eye: No discharge.     Extraocular Movements: Extraocular movements intact.     Conjunctiva/sclera: Conjunctivae normal.     Pupils: Pupils are equal, round, and  reactive to light.  Cardiovascular:     Rate and Rhythm: Normal rate and regular rhythm.     Pulses: Normal pulses.     Heart sounds: Normal heart sounds. No murmur heard.    No friction rub. No gallop.  Pulmonary:     Effort: Pulmonary effort is normal. No respiratory distress.     Breath sounds: No stridor. Rhonchi present. No wheezing or rales.  Chest:     Chest wall: No tenderness.  Abdominal:     General: Abdomen is flat. There is no distension.     Palpations: Abdomen is soft.     Tenderness: There is no abdominal tenderness. There is no right CVA tenderness, left CVA tenderness, guarding or  rebound.  Musculoskeletal:        General: No swelling, deformity or signs of injury.     Cervical back: Normal range of motion. No rigidity.     Right lower leg: Edema present.     Left lower leg: Edema present.  Skin:    General: Skin is warm and dry.     Findings: No bruising, erythema or lesion.  Neurological:     General: No focal deficit present.     Mental Status: She is alert and oriented to person, place, and time. Mental status is at baseline.  Psychiatric:        Mood and Affect: Mood normal.     (all labs ordered are listed, but only abnormal results are displayed) Labs Reviewed  CBC WITH DIFFERENTIAL/PLATELET - Abnormal; Notable for the following components:      Result Value   RBC 3.57 (*)    Hemoglobin 10.9 (*)    HCT 35.3 (*)    All other components within normal limits  COMPREHENSIVE METABOLIC PANEL WITH GFR - Abnormal; Notable for the following components:   CO2 33 (*)    Glucose, Bld 118 (*)    Total Protein 5.7 (*)    Albumin 3.4 (*)    All other components within normal limits  PRO BRAIN NATRIURETIC PEPTIDE - Abnormal; Notable for the following components:   Pro Brain Natriuretic Peptide 386.0 (*)    All other components within normal limits  URINALYSIS, ROUTINE W REFLEX MICROSCOPIC - Abnormal; Notable for the following components:   APPearance CLOUDY  (*)    All other components within normal limits  TROPONIN T, HIGH SENSITIVITY - Abnormal; Notable for the following components:   Troponin T High Sensitivity 53 (*)    All other components within normal limits  TROPONIN T, HIGH SENSITIVITY - Abnormal; Notable for the following components:   Troponin T High Sensitivity 50 (*)    All other components within normal limits  CULTURE, BLOOD (ROUTINE X 2)  CULTURE, BLOOD (ROUTINE X 2)  RESPIRATORY PANEL BY PCR  EXPECTORATED SPUTUM ASSESSMENT W GRAM STAIN, RFLX TO RESP C  LIPASE, BLOOD  LACTIC ACID, PLASMA  LACTIC ACID, PLASMA  HIV ANTIBODY (ROUTINE TESTING W REFLEX)  PROCALCITONIN  CBC  BASIC METABOLIC PANEL WITH GFR  BLOOD GAS, VENOUS    EKG: EKG Interpretation Date/Time:  Wednesday June 17 2024 15:36:19 EDT Ventricular Rate:  66 PR Interval:  194 QRS Duration:  94 QT Interval:  420 QTC Calculation: 440 R Axis:   41  Text Interpretation: Normal sinus rhythm Low voltage QRS Cannot rule out Anterior infarct , age undetermined Abnormal ECG When compared with ECG of 27-Apr-2024 03:55, PREVIOUS ECG IS PRESENT No significant change since last tracing Confirmed by Dean Clarity [phone]) on 06/17/2024 3:55:34 PM  Radiology: CT Lumbar Spine Wo Contrast Result Date: 06/17/2024 CLINICAL DATA:  Metastatic disease evaluation * Tracking Code: BO * EXAM: CT LUMBAR SPINE WITHOUT CONTRAST TECHNIQUE: Multidetector CT imaging of the lumbar spine was performed without intravenous contrast administration. Multiplanar CT image reconstructions were also generated. RADIATION DOSE REDUCTION: This exam was performed according to the departmental dose-optimization program which includes automated exposure control, adjustment of the mA and/or kV according to patient size and/or use of iterative reconstruction technique. COMPARISON:  CT chest abdomen pelvis, 04/26/2024 FINDINGS: Segmentation: Five lumbar type vertebrae. Alignment: Straightening of the normal  lumbar lordosis Vertebrae: Osteopenia without fracture or suspicious osseous lesions. Paraspinal and other soft tissues: Aortic atherosclerosis. Disc  levels: Minimal multilevel lumbar endplate osteophytosis without significant disc space height loss. Mild multilevel facet degenerative change throughout. Other: Heterogeneously lytic and sclerotic lesion of the right ileal body not significantly changed (series 5, image 107) IMPRESSION: 1. No fracture or dislocation of the lumbar spine. 2. No suspicious osseous lesions of the lumbar vertebral bodies. 3. 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 Aortic Atherosclerosis (ICD10-I70.0). Electronically Signed   By: Marolyn JONETTA Jaksch M.D.   On: 06/17/2024 16:48   CT Chest Wo Contrast Result Date: 06/17/2024 CLINICAL DATA:  History of lung cancer, CHF, multifocal pneumonia * Tracking Code: BO * EXAM: CT CHEST WITHOUT CONTRAST TECHNIQUE: Multidetector CT imaging of the chest was performed following the standard protocol without IV contrast. RADIATION DOSE REDUCTION: This exam was performed according to the departmental dose-optimization program which includes automated exposure control, adjustment of the mA and/or kV according to patient size and/or use of iterative reconstruction technique. COMPARISON:  CT chest abdomen pelvis, 04/26/2024 FINDINGS: Cardiovascular: Aortic atherosclerosis. Cardiomegaly with scattered left coronary artery calcifications. Unchanged, large pericardial effusion (series 2, image 73). Mediastinum/Nodes: No enlarged mediastinal, hilar, or axillary lymph nodes. Thyroid  gland, trachea, and esophagus demonstrate no significant findings. Lungs/Pleura: No significant change in masslike consolidation of the dependent right lower lobe with a small right pleural effusion. Improved aeration of the dependent left lung base with chronic scarring or atelectasis.  Background of fine centrilobular nodularity and ground-glass throughout the upper lungs, worsened compared to prior examination (series 6, image 42). Upper Abdomen: No acute abnormality.  Cholecystectomy. Musculoskeletal: Unchanged sclerotic lesion of the sternal body (series 10, image 88). Unchanged vertebral cement augmentation of T8. IMPRESSION: 1. No significant change in masslike consolidation of the dependent right lower lobe with a small right pleural effusion. 2. Improved aeration of the dependent left lung base with chronic scarring or atelectasis. 3. Background of fine centrilobular nodularity and ground-glass throughout the upper lungs, worsened compared to prior examination, consistent with nonspecific atypical infection or inflammation. 4. Unchanged, large pericardial effusion. 5. Unchanged sclerotic lesion of the sternal body. 6. Coronary artery disease. Aortic Atherosclerosis (ICD10-I70.0). Electronically Signed   By: Marolyn JONETTA Jaksch M.D.   On: 06/17/2024 16:44    Procedures   Medications Ordered in the ED  azithromycin  (ZITHROMAX ) 500 mg in sodium chloride  0.9 % 250 mL IVPB (500 mg Intravenous New Bag/Given 06/17/24 2125)  azithromycin  (ZITHROMAX ) 500 mg in sodium chloride  0.9 % 250 mL IVPB (has no administration in time range)  acetaminophen  (TYLENOL ) tablet 650 mg (has no administration in time range)    Or  acetaminophen  (TYLENOL ) suppository 650 mg (has no administration in time range)  ondansetron  (ZOFRAN ) tablet 4 mg (has no administration in time range)    Or  ondansetron  (ZOFRAN ) injection 4 mg (has no administration in time range)  ALPRAZolam  (XANAX ) tablet 0.25 mg (has no administration in time range)  dexamethasone  (DECADRON ) tablet 0.5 mg (has no administration in time range)  furosemide  (LASIX ) tablet 20 mg (has no administration in time range)  ipratropium-albuterol  (DUONEB) 0.5-2.5 (3) MG/3ML nebulizer solution 3 mL (has no administration in time range)  hydrocortisone   (ANUSOL -HC) 2.5 % rectal cream (has no administration in time range)  levETIRAcetam  (KEPPRA ) tablet 1,000 mg (has no administration in time range)  metoprolol  succinate (TOPROL -XL) 24 hr tablet 25 mg (has no administration in time range)  morphine  (MS CONTIN ) 12 hr tablet 15 mg (has no administration in time  range)  OLANZapine  (ZYPREXA ) tablet 2.5 mg (has no administration in time range)  pantoprazole  (PROTONIX ) EC tablet 40 mg (has no administration in time range)  polyethylene glycol (MIRALAX  / GLYCOLAX ) packet 17 g (has no administration in time range)  senna-docusate (Senokot-S) tablet 2 tablet (has no administration in time range)  albuterol  (PROVENTIL ) (2.5 MG/3ML) 0.083% nebulizer solution 2.5 mg (has no administration in time range)  oxyCODONE  (Oxy IR/ROXICODONE ) immediate release tablet 5 mg (has no administration in time range)  ceFEPIme  (MAXIPIME ) 2 g in sodium chloride  0.9 % 100 mL IVPB (has no administration in time range)  ceFEPIme  (MAXIPIME ) 2 g in sodium chloride  0.9 % 100 mL IVPB (2 g Intravenous New Bag/Given 06/17/24 2121)      Medical Decision Making Amount and/or Complexity of Data Reviewed Labs: ordered. Radiology: ordered. ECG/medicine tests: ordered.  Risk Decision regarding hospitalization.  This patient is a 68 year old female who presents to the ED for concern of worsening cough, low back pain has been progressive for the last couple weeks, recently diagnosed multifocal pneumonia and was placed on chronic 2 L O2 respiratory failure noted in the past.  Send here today for repeat chest x-ray with concerns for pleural effusions and worsening CHF from facility.  Previous medical history of lung cancer with brain metastases.  Currently undergoing chemotherapy with last infusion 10/3.  Family bedside noted that she had been improving since leaving hospital but about a week ago began progressively more lethargic with worsening cough.  On physical exam, patient is in no  acute distress, afebrile, alert and orient x 4, nontachypneic, nontachycardic.  Notably does have some mild rhonchi bilaterally.  Bilateral rhonchi noted over the lungs.  Abdomen is nontender, with no CVA tenderness.  Mild lower leg edema which is chronic and not worse than baseline per patient and family.  Unremarkable otherwise.  Lab work was unremarkable for any acute findings, CT scan did note some increased inflammation to upper lobes bilaterally suspicious for inflammation versus infection.  With her having increased lethargy, concern for infection and provided azithromycin  and cefepime .  Patient case was discussed with attending, who also saw patient and agreed that patient warranted admission.  Patient care transferred over to Dr. Mennie.   Differential diagnoses prior to evaluation: The emergent differential diagnosis includes, but is not limited to,  CHF, pericardial effusion/tamponade, arrhythmias, ACS, COPD, asthma, bronchitis, pneumonia, pneumothorax, PE, anemia, metastasis, progressive cancer. This is not an exhaustive differential.   Past Medical History / Co-morbidities / Social History: lung cancer with brain metastases, type 2 diabetes, anemia, DVT, heart failure with reduced EF, respiratory failure on long-term oxygen 2 L.  Additional history: Chart reviewed. Pertinent results include:   Patient last seen by oncology on 06/12/2024 noted to have chemotherapy infusion at that time for lung cancer with brain metastases noted.  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  Seeing Dr. Federico with oncology for lung masses.  Lab Tests/Imaging studies: I personally interpreted labs/imaging and the pertinent results include: CBCand hemoglobin CMP shows no acute findings Lactic acid unremarkable UA unremarkable BNP nonspecific elevation of 386 Troponins flat CT lumbar spine shows no acute metastases. Chest x-ray shows no significant changes to pleural effusion and noted specific  changes to pericardial effusion but did note bilateral upper lobe inflammation consistent with atypical infection versus inflammation  I agree with the radiologist interpretation.  Cardiac monitoring: EKG obtained and interpreted by myself and attending physician which shows: NSR  EKG Interpretation Date/Time:  Wednesday June 17 2024 15:36:19 EDT Ventricular Rate:  66 PR Interval:  194 QRS Duration:  94 QT Interval:  420 QTC Calculation: 440 R Axis:   41  Text Interpretation: Normal sinus rhythm Low voltage QRS Cannot rule out Anterior infarct , age undetermined Abnormal ECG When compared with ECG of 27-Apr-2024 03:55, PREVIOUS ECG IS PRESENT No significant change since last tracing Confirmed by Dean Clarity [phone]) on 06/17/2024 3:55:34 PM          Medications: I ordered medication including cefepime , with the myosin.  I have reviewed the patients home medicines and have made adjustments as needed.  Critical Interventions: None  Social Determinants of Health: Is currently at [facility] health care facility  Disposition: After consideration of the diagnostic results and the patients response to treatment, I feel that the patient would benefit from admission, least with observations overnight for improvement with likely consult with oncology for possible early MRI brain.  Patient care transferred to Dr. Mennie.  Final diagnoses:  Pneumonia of both upper lobes due to infectious organism  Lethargy    ED Discharge Orders     None          Jachai Okazaki S, [name] 06/17/24 2208    Patt Alm Macho, MD 06/18/24 0005

## 2024-06-17 NOTE — ED Triage Notes (Signed)
 Pt BIB EMS from [facility] facility. They wanted a second x ray because the one they took (on Oct 2nd) was abnormal. AO4 and bedbound   BP 138/96  60 Hr  100 O2 on 2L (at baseline)  92 CBG

## 2024-06-17 NOTE — Hospital Course (Addendum)
 Virginia [name] is a 68 y.o. 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  Patient was admitted with multifocal pneumonia hypoxia on August 22.  Husband at bedside states he was worried she was more lethargic at facility and thought he is going to lose her Now she appears back to normal Patient otherwise denies any nausea, vomiting, chest pain,fever, chills, headache, focal weakness, numbness tingling, speech difficulties   In ED: Vitals stable BP 107-125, SpO2 stable on room air labs reviewed bicarb 32 previously 36 in 10/3, Elamin low 3.4 normal LFTs lipase proBNP 386 troponin 53 > 50, and chronic anemia with hemoglobin 10 g, UA normal CT chest w/o>> masslike consolidation of RLL with small right pleural effusion, no significant change improved aeration left lung base GGO throughout upper lung worse on [place] infection/inflammation unchanged large pericardial effusion unchanged sclerotic lesion of the sternal body CAD CT lumbar spine>> no fracture or dislocation of lumbar spine no suspicious osseous lesion.  Lidocaine  sclerotic lesion on the right ilial body not significantly changed-from treated  osseous metastasis Admission has been requested and given her generalized weakness worsening of her shortness of breath cough due to concern for similar presentation when she had a multifocal pneumonia on last  admission.  Assessment and plan:  Lethargy Cough Progressive weakness On immunotherapy- last dose Friday-completed Chronic hypoxia on 2l [place] COPD: CT chest w/ GGO changes see above no obvious  pneumonia. Will admit her continue empiric antibiotics in the setting of immunotherapy with brain mets and worsening symptoms. Speech eval. continue bronchodilators as needed.  Resume her home steroid 0.5mg  decadron . Cont Nora 02 - 2l home setting from last discharge. Blood cultures lactic acid, RVP ordered. Also check VBG.  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  Anxiety disorder: Continue Xanax  prn, Zyprexa   Chronic pain Constipation: Continue her opiates mscontin 15mg  bid and oxy 5mg  prn ( as per husband she is on 5mg ) cont bowel regimen  GERD: Continue PPI  Pericardial effusion without cardiac tamponade: CT shows large pericardial effusion unchanged.  Monitor.  CHF mrEF Essential hypertension: Resume home metoprolol  and Lasix .  T2DM W/ complication: Last A1c in August 5.8, check CBG twice daily  Elevated troponin Flat.  No chest pain.  Class I Obesity w/ Body mass index is 34.35 kg/m.: Will benefit with PCP follow-up, weight loss,healthy lifestyle and outpatient sleep eval if not done.   DVT prophylaxis: SCDs Start: 06/17/24 2134 Code Status:   Code Status: Full Code Family Communication: plan of care discussed with patient  AND HER HUSBAND at bedside. Patient status is: Remains hospitalized because of severity of illness Level of care: Telemetry   Dispo: The patient is from: [facility]            Anticipated disposition:  TBD Objective: Vitals last 24 hrs: Vitals:   06/17/24 1700 06/17/24 1800 06/17/24 1815 06/17/24 2045  BP: 107/87 122/80   122/75  Pulse: 66 67 65 67  Resp: 18 16  15   Temp:   98.2 F (36.8 C)   TempSrc:   Oral   SpO2: 100% 100% 100% 100%  Weight:      Height:        Physical Examination: General exam: alert awake, oriented, older than stated age HEENT:Oral mucosa moist, Ear/Nose WNL grossly Respiratory system: Bilaterally clear BS,no use of accessory muscle Cardiovascular system: S1 & S2 +, No JVD. Gastrointestinal system: Abdomen soft,NT,ND, BS+ Nervous System: Alert, awake, moving UE well, left weaker thatn rt on legs and weak all over on both legs Extremities: extremities warm, leg edema + chronic, with reddish discoloration no tenderness Skin: No rashes,no icterus. MSK: Normal muscle bulk,tone, power   Medications reviewed:  Scheduled Meds:  [START ON 06/18/2024] dexamethasone   0.5 mg Oral Daily   [START ON 06/18/2024] furosemide   20 mg Oral Daily   ipratropium-albuterol   3 mL Nebulization TID   levETIRAcetam   1,000 mg Oral BID   [START ON 06/18/2024] metoprolol  succinate  25 mg Oral Daily   morphine   15 mg Oral Q12H   OLANZapine   2.5 mg Oral QHS   [START ON 06/18/2024] pantoprazole   40 mg Oral Daily   [START ON 06/18/2024] polyethylene glycol  17 g Oral Daily   Continuous Infusions:  azithromycin  (ZITHROMAX ) 500 mg in sodium chloride  0.9 % 250 mL IVPB 500 mg (06/17/24 2125)   [START ON 06/18/2024] azithromycin      ceFEPime  (MAXIPIME ) IV 2 g (06/17/24 2121)   Diet: Diet Order             Diet Carb Modified Fluid consistency: Thin; Room service appropriate? Yes  Diet effective now

## 2024-06-17 NOTE — Progress Notes (Signed)
 ED Pharmacy Antibiotic Sign Off An antibiotic consult was received from an ED provider for Cefepime  per pharmacy dosing for HCAP. A chart review was completed to assess appropriateness.   The following one time order(s) were placed:  Cefepime  2g IV  Further antibiotic and/or antibiotic pharmacy consults should be ordered by the admitting provider if indicated.   Thank you for allowing pharmacy to be a part of this patient's care.   Arvin Gauss, PharmD Clinical Pharmacist 06/17/24 9:16 PM

## 2024-06-17 NOTE — H&P (Signed)
 History and Physical    Virginia [name] FMW:[id number] DOB: November 24, 1955 DOA: 06/17/2024  PCP: Perri Ronal PARAS, MD   Patient coming from: [facility]  Chief Complaint  Patient presents with   facility evaluation request       HPI: Virginia [name] is a 68 y.o. 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  Patient was admitted with multifocal pneumonia hypoxia on August 22.  Husband at bedside states he was worried she was more lethargic at facility and thought he is going to lose her Now she appears back to normal Patient otherwise denies any nausea, vomiting, chest pain,fever, chills, headache, focal weakness, numbness tingling, speech difficulties   In ED: Vitals stable BP 107-125, SpO2 stable on room air labs reviewed bicarb 32 previously 36 in 10/3, Elamin low 3.4 normal LFTs lipase proBNP 386 troponin 53 > 50, and chronic anemia with hemoglobin 10 g, UA normal CT chest w/o>> masslike consolidation of RLL with small right pleural effusion, no significant change improved aeration left lung base GGO throughout upper lung worse on [place] infection/inflammation unchanged large pericardial effusion unchanged sclerotic lesion of the sternal body CAD CT lumbar spine>> no fracture or dislocation of lumbar spine no suspicious osseous lesion.  Lidocaine  sclerotic lesion on the right ilial body not significantly  changed-from treated  osseous metastasis Admission has been requested and given her generalized weakness worsening of her shortness of breath cough due to concern for similar presentation when she had a multifocal pneumonia on last admission.  Assessment and plan:  Lethargy Cough Progressive weakness On immunotherapy- last dose Friday-completed Chronic hypoxia on 2l [place] COPD: CT chest w/ GGO changes see above no obvious  pneumonia. Will admit her continue empiric antibiotics in the setting of immunotherapy with brain mets and worsening symptoms. Speech eval. continue bronchodilators as needed.  Resume her home steroid 0.5mg  decadron . Cont [place] 02 - 2l home setting from last discharge. Blood cultures lactic acid, RVP ordered. Also check VBG.  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  Anxiety disorder: Continue Xanax  prn, Zyprexa   Chronic pain Constipation: Continue her opiates mscontin 15mg  bid and oxy 5mg  prn ( as per husband she is on 5mg ) cont bowel regimen  GERD: Continue PPI  Pericardial effusion without cardiac tamponade: CT shows large pericardial effusion unchanged.  Monitor.  CHF mrEF Essential hypertension: Resume home metoprolol  and Lasix .  T2DM W/ complication: Last A1c in August 5.8, check CBG twice daily  Elevated troponin Flat.  No chest pain.  Class I Obesity w/ Body mass index is 34.35 kg/m.: Will benefit with PCP follow-up, weight loss,healthy lifestyle and outpatient sleep eval if not done.   DVT prophylaxis: SCDs Start: 06/17/24 2134 Code Status:   Code Status: Full Code Family Communication: plan of  care discussed with patient  AND HER HUSBAND at bedside. Patient status is: Remains hospitalized because of severity of illness Level  of care: Telemetry   Dispo: The patient is from: [facility]            Anticipated disposition: TBD Objective: Vitals last 24 hrs: Vitals:   06/17/24 1700 06/17/24 1800 06/17/24 1815 06/17/24 2045  BP: 107/87 122/80  122/75  Pulse: 66 67 65 67  Resp: 18 16  15   Temp:   98.2 F (36.8 C)   TempSrc:   Oral   SpO2: 100% 100% 100% 100%  Weight:      Height:        Physical Examination: General exam: alert awake, oriented, older than stated age HEENT:Oral mucosa moist, Ear/Nose WNL grossly Respiratory system: Bilaterally clear BS,no use of accessory muscle Cardiovascular system: S1 & S2 +, No JVD. Gastrointestinal system: Abdomen soft,NT,ND, BS+ Nervous System: Alert, awake, moving UE well, left weaker thatn rt on legs and weak all over on both legs Extremities: extremities warm, leg edema + chronic, with reddish discoloration no tenderness Skin: No rashes,no icterus. MSK: Normal muscle bulk,tone, power   Medications reviewed:  Scheduled Meds:  [START ON 06/18/2024] dexamethasone   0.5 mg Oral Daily   [START ON 06/18/2024] furosemide   20 mg Oral Daily   ipratropium-albuterol   3 mL Nebulization TID   levETIRAcetam   1,000 mg Oral BID   [START ON 06/18/2024] metoprolol  succinate  25 mg Oral Daily   morphine   15 mg Oral Q12H   OLANZapine   2.5 mg Oral QHS   [START ON 06/18/2024] pantoprazole   40 mg Oral Daily   [START ON 06/18/2024] polyethylene glycol  17 g Oral Daily   Continuous Infusions:  azithromycin  (ZITHROMAX ) 500 mg in sodium chloride  0.9 % 250 mL IVPB 500 mg (06/17/24 2125)   [START ON 06/18/2024] azithromycin      ceFEPime  (MAXIPIME ) IV 2 g (06/17/24 2121)   Diet: Diet Order             Diet Carb Modified Fluid consistency: Thin; Room service appropriate? Yes  Diet effective now                     Severity of Illness: The appropriate patient status for this patient is OBSERVATION. Observation status is judged to be reasonable and necessary in order to provide  the required intensity of service to ensure the patient's safety. The patient's presenting symptoms, physical exam findings, and initial radiographic and laboratory data in the context of their medical condition is felt to place them at decreased risk for further clinical deterioration. Furthermore, it is anticipated that the patient will be medically stable for discharge from the hospital within 2 midnights of admission.   Family Communication: Admission, patients condition and plan of care including tests being ordered have been discussed with the patient and husband who indicate understanding and agree with the plan and Code Status.  Consults called:  None  Review of Systems: All systems were reviewed and were negative except as mentioned in HPI above. Negative for fever Negative for chest pain Negative for diarrhea  Past Medical History:  Diagnosis Date   Anemia    Anxiety    Concussion 09/28/2019   DVT (deep venous thrombosis) (HCC) 2021   L leg   Dyspnea    GERD (gastroesophageal reflux disease)    Hypercholesterolemia    per pt, she does not have elevated lipids   Hypertension    met  lung ca dx'd 09/2019   mets to spine, hip and brain   PONV (postoperative nausea and vomiting)    Tobacco abuse     Past Surgical History:  Procedure Laterality Date   ABDOMINAL HYSTERECTOMY     partial/ left ovaries   APPLICATION OF CRANIAL NAVIGATION N/A 12/02/2020   Procedure: APPLICATION OF CRANIAL NAVIGATION;  Surgeon: [name], MD;  Location: [facility] OR;  Service: Neurosurgery;  Laterality: N/A;   CHOLECYSTECTOMY     CRANIOTOMY N/A 12/02/2020   Procedure: Posterior fossa craniotomy for tumor resection with brainlab;  Surgeon: [name], MD;  Location: [facility] OR;  Service: Neurosurgery;  Laterality: N/A;   DILATION AND CURETTAGE OF UTERUS     IR IMAGING GUIDED PORT INSERTION  10/23/2019   IR PATIENT EVAL TECH 0-60 MINS  11/21/2021   IR PATIENT EVAL TECH 0-60 MINS  11/24/2021   IR PATIENT  EVAL TECH 0-60 MINS  11/29/2021   IR PATIENT EVAL TECH 0-60 MINS  12/04/2021   IR PATIENT EVAL TECH 0-60 MINS  12/12/2021   IR PATIENT EVAL TECH 0-60 MINS  12/19/2021   IR PATIENT EVAL TECH 0-60 MINS  12/27/2021   IR PATIENT EVAL TECH 0-60 MINS  01/03/2022   IR PATIENT EVAL TECH 0-60 MINS  01/10/2022   IR PATIENT EVAL TECH 0-60 MINS  01/18/2022   IR PATIENT EVAL TECH 0-60 MINS  02/13/2022   IR PATIENT EVAL TECH 0-60 MINS  02/20/2022   IR RADIOLOGIST EVAL & MGMT  01/23/2022   IR REMOVAL TUN ACCESS W/ PORT W/O FL MOD SED  11/17/2021   KYPHOPLASTY N/A 03/15/2020   Procedure: Thoracic Eight KYPHOPLASTY;  Surgeon: [name], MD;  Location: [facility] OR;  Service: Neurosurgery;  Laterality: N/A;  prone    LIPOMA EXCISION  2018   removed under left breast and right thigh.   PERICARDIOCENTESIS N/A 12/10/2023   Procedure: PERICARDIOCENTESIS;  Surgeon: Wendel Lurena POUR, MD;  Location: [facility] INVASIVE CV LAB;  Service: Cardiovascular;  Laterality: N/A;   TUBAL LIGATION       reports that she quit smoking about 11 years ago. Her smoking use included cigarettes. She has never used smokeless tobacco. She reports current alcohol use. She reports that she does not use drugs.  Allergies  Allergen Reactions   Scopolamine  Rash    Family History  Problem Relation Age of Onset   Heart disease Father    Drug abuse Daughter    Drug abuse Son    Cancer Sister    Heart disease Brother    Heart attack Brother      Prior to Admission medications   Medication Sig Start Date End Date Taking? Authorizing Provider  acetaminophen  (TYLENOL ) 325 MG tablet Take 1-2 tablets (325-650 mg total) by mouth every 4 (four) hours as needed for mild pain (pain score 1-3). Patient taking differently: Take 500 mg by mouth every 4 (four) hours as needed for mild pain (pain score 1-3). 07/01/23   Setzer, Sandra J, [name]  albuterol  (VENTOLIN  HFA) 108 (90 Base) MCG/ACT inhaler INHALE 2 PUFFS INTO THE LUNGS EVERY 6 HOURS AS NEEDED FOR WHEEZING OR  SHORTNESS OF BREATH 12/09/23   Perri Ronal PARAS, MD  ALPRAZolam  (XANAX ) 0.25 MG tablet Take 1 tablet (0.25 mg total) by mouth 2 (two) times daily as needed for anxiety. 05/01/24   Regalado, Belkys A, MD  Cholecalciferol  (VITAMIN D3) 125 MCG (5000 UT) TABS Take 5,000 Units by mouth every evening.    [provider]  colchicine  0.6 MG tablet TAKE 1 TABLET(0.6 MG) BY MOUTH DAILY 06/11/24   Dick, Ernest H Jr., NP  Cyanocobalamin  (VITAMIN B12 PO) Take 5,000 mcg by mouth daily. Gummies    [provider]  dexamethasone  (DECADRON ) 1 MG tablet Take 1 tablet (1 mg total) by mouth daily. Patient taking differently: Take 0.5 mg by mouth daily. 01/27/24   Vaslow, Zachary K, MD  furosemide  (LASIX ) 20 MG tablet Take 1 tablet (20 mg total) by mouth 2 (two) times a week. Tues & Thurs Patient taking differently: Take 20 mg by mouth daily. 05/04/24 08/02/24  Regalado, Belkys A, MD  hydrocortisone  (ANUSOL -HC) 2.5 % rectal cream APPLY RECTALLY TO THE AFFECTED AREA 2 TO 3 TIMES A DAY AS NEEDED 05/28/24   Perri Ronal PARAS, MD  ipratropium-albuterol  (DUONEB) 0.5-2.5 (3) MG/3ML SOLN Take 3 mLs by nebulization 3 (three) times daily. 05/01/24   Regalado, Belkys A, MD  levETIRAcetam  (KEPPRA ) 1000 MG tablet Take 1 tablet (1,000 mg total) by mouth 2 (two) times daily. 05/01/24   Regalado, Belkys A, MD  Magnesium  Glycinate 100 MG CAPS Take 1 capsule by mouth in the morning and at bedtime.    [provider]  meclizine  (ANTIVERT ) 50 MG tablet Take 1 tablet (50 mg total) by mouth 3 (three) times daily. Patient taking differently: Take 100 mg by mouth 3 (three) times daily. As needed 07/29/23   Vaslow, Zachary K, MD  metoprolol  succinate (TOPROL -XL) 25 MG 24 hr tablet Take 1 tablet (25 mg total) by mouth daily. 07/26/23   Jerilynn Lamarr HERO, NP  morphine  (MS CONTIN ) 15 MG 12 hr tablet Take 15 mg by mouth every 12 (twelve) hours.    [provider]  Multiple Vitamin (MULTIVITAMIN WITH MINERALS) TABS tablet  Take 2 tablets by mouth daily. Gummy    [provider]  Nutritional Supplements (,FEEDING SUPPLEMENT, PROSOURCE PLUS) liquid Take 30 mLs by mouth 2 (two) times daily between meals. Patient taking differently: Take 30 mLs by mouth 4 (four) times daily. Berry flavor 07/01/23   Setzer, Nena PARAS, [name]  OLANZapine  (ZYPREXA ) 2.5 MG tablet Take 1 tablet (2.5 mg total) by mouth at bedtime. 05/01/24   Regalado, Belkys A, MD  ondansetron  (ZOFRAN ) 4 MG tablet TAKE 1 TABLET(4 MG) BY MOUTH EVERY 8 HOURS AS NEEDED FOR NAUSEA OR VOMITING 01/30/23   Federico Norleen ONEIDA MADISON, MD  oxyCODONE  (ROXICODONE ) 15 MG immediate release tablet Take 15 mg by mouth every 4 (four) hours as needed for pain.    [provider]  pantoprazole  (PROTONIX ) 40 MG tablet TAKE 1 TABLET(40 MG) BY MOUTH DAILY 09/12/23   Perri Ronal PARAS, MD  polyethylene glycol (MIRALAX  / GLYCOLAX ) 17 g packet Take 17 g by mouth daily. 05/02/24   Regalado, Belkys A, MD  senna-docusate (SENOKOT-S) 8.6-50 MG tablet Take 2 tablets by mouth daily as needed (Patient preference, Sennakot S for mild constipation). Patient taking differently: Take 2 tablets by mouth daily as needed for mild constipation. 2 tabs 2 x daily 07/01/23   Setzer, Sandra J, [name]  XTAMPZA  ER 13.5 MG C12A Take 1 capsule by mouth in the morning and at bedtime. Patient not taking: Reported on 06/12/2024 04/16/24   [place] Fannie SAILOR, NP   r   Labs on Admission: I have personally reviewed following labs and imaging studies  CBC: Recent Labs  Lab 06/12/24 1324 06/17/24 1647  WBC 7.2 8.5  NEUTROABS 4.3 5.8  HGB 10.8* 10.9*  HCT 33.0* 35.3*  MCV 95.1 98.9  PLT 230 223   Basic Metabolic Panel: Recent Labs  Lab 06/12/24 1324 06/17/24 1647  NA 145 144  K 3.8 3.6  CL 104 102  CO2 36* 33*  GLUCOSE 91 118*  BUN 20 21  CREATININE 0.86 0.92  CALCIUM  9.3 9.7   Estimated Creatinine Clearance: 60.9 mL/min (by C-G formula based on SCr of 0.92 mg/dL). Recent Labs  Lab  06/12/24 1324 06/17/24 1647  AST 13* 15  ALT 12 13  ALKPHOS 53 56  BILITOT 0.3 0.3  PROT 6.1* 5.7*  ALBUMIN 3.1* 3.4*   Recent Labs  Lab 06/17/24 1648  LIPASE 21   No results for input(s): AMMONIA in the last 168 hours. Coagulation Profile: No results for input(s): INR, PROTIME in the last 168 hours. Cardiac Panel (last 3 results) No results for input(s): CKTOTAL, CKMB, TROPONINIHS, RELINDX in the last 72 hours.  BNP (last 3 results) Recent Labs    06/17/24 1647  PROBNP 386.0*   HbA1C: No results for input(s): HGBA1C in the last 72 hours. CBG: No results for input(s): GLUCAP in the last 168 hours. Lipid Profile: No results for input(s): CHOL, HDL, LDLCALC, TRIG, CHOLHDL, LDLDIRECT in the last 72 hours. Thyroid  Function Tests: No results for input(s): TSH, T4TOTAL, FREET4, T3FREE, THYROIDAB in the last 72 hours. Urine analysis:    Component Value Date/Time   COLORURINE YELLOW 06/17/2024 1815   APPEARANCEUR CLOUDY (A) 06/17/2024 1815   LABSPEC 1.013 06/17/2024 1815   PHURINE 7.0 06/17/2024 1815   GLUCOSEU NEGATIVE 06/17/2024 1815   HGBUR NEGATIVE 06/17/2024 1815   BILIRUBINUR NEGATIVE 06/17/2024 1815   BILIRUBINUR NEG 06/12/2019 1639   KETONESUR NEGATIVE 06/17/2024 1815   PROTEINUR NEGATIVE 06/17/2024 1815   UROBILINOGEN 0.2 06/12/2019 1639   UROBILINOGEN 0.2 04/01/2014 1006   NITRITE NEGATIVE 06/17/2024 1815   LEUKOCYTESUR NEGATIVE 06/17/2024 1815    Radiological Exams on Admission: CT Lumbar Spine Wo Contrast Result Date: 06/17/2024 CLINICAL DATA:  Metastatic disease evaluation * Tracking Code: BO * EXAM: CT LUMBAR SPINE WITHOUT CONTRAST TECHNIQUE: Multidetector CT imaging of the lumbar spine was performed without intravenous contrast administration. Multiplanar CT image reconstructions were also generated. RADIATION DOSE REDUCTION: This exam was performed according to the departmental dose-optimization program which  includes automated exposure control, adjustment of the mA and/or kV according to patient size and/or use of iterative reconstruction technique. COMPARISON:  CT chest abdomen pelvis, 04/26/2024 FINDINGS: Segmentation: Five lumbar type vertebrae. Alignment: Straightening of the normal lumbar lordosis Vertebrae: Osteopenia without fracture or suspicious osseous lesions. Paraspinal and other soft tissues: Aortic atherosclerosis. Disc levels: Minimal multilevel lumbar endplate osteophytosis without significant disc space height loss. Mild multilevel facet degenerative change throughout. Other: Heterogeneously lytic and sclerotic lesion of the right ileal body not significantly changed (series 5, image 107) IMPRESSION: 1. No fracture or dislocation of the lumbar spine. 2. No suspicious osseous lesions of the lumbar vertebral bodies. 3. 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 Aortic Atherosclerosis (ICD10-I70.0). Electronically Signed   By: Marolyn JONETTA Jaksch M.D.   On: 06/17/2024 16:48   CT Chest Wo Contrast Result Date: 06/17/2024 CLINICAL DATA:  History of lung cancer, CHF, multifocal pneumonia * Tracking Code: BO * EXAM: CT CHEST WITHOUT CONTRAST TECHNIQUE: Multidetector CT imaging of the chest was performed following the standard protocol without IV contrast. RADIATION DOSE REDUCTION: This exam was performed according to the departmental dose-optimization program which includes automated exposure control, adjustment of  the mA and/or kV according to patient size and/or use of iterative reconstruction technique. COMPARISON:  CT chest abdomen pelvis, 04/26/2024 FINDINGS: Cardiovascular: Aortic atherosclerosis. Cardiomegaly with scattered left coronary artery calcifications. Unchanged, large pericardial effusion (series 2, image 73). Mediastinum/Nodes: No enlarged mediastinal, hilar, or axillary lymph nodes. Thyroid   gland, trachea, and esophagus demonstrate no significant findings. Lungs/Pleura: No significant change in masslike consolidation of the dependent right lower lobe with a small right pleural effusion. Improved aeration of the dependent left lung base with chronic scarring or atelectasis. Background of fine centrilobular nodularity and ground-glass throughout the upper lungs, worsened compared to prior examination (series 6, image 42). Upper Abdomen: No acute abnormality.  Cholecystectomy. Musculoskeletal: Unchanged sclerotic lesion of the sternal body (series 10, image 88). Unchanged vertebral cement augmentation of T8. IMPRESSION: 1. No significant change in masslike consolidation of the dependent right lower lobe with a small right pleural effusion. 2. Improved aeration of the dependent left lung base with chronic scarring or atelectasis. 3. Background of fine centrilobular nodularity and ground-glass throughout the upper lungs, worsened compared to prior examination, consistent with nonspecific atypical infection or inflammation. 4. Unchanged, large pericardial effusion. 5. Unchanged sclerotic lesion of the sternal body. 6. Coronary artery disease. Aortic Atherosclerosis (ICD10-I70.0). Electronically Signed   By: Marolyn JONETTA Jaksch M.D.   On: 06/17/2024 16:44   Mennie LAMY MD [facility]  If 7PM-7AM, please contact night-coverage [email or web address]  06/17/2024, 9:38 PM

## 2024-06-18 ENCOUNTER — Encounter (HOSPITAL_COMMUNITY): Payer: Self-pay | Admitting: Internal Medicine

## 2024-06-18 DIAGNOSIS — Z7189 Other specified counseling: Secondary | ICD-10-CM | POA: Diagnosis not present

## 2024-06-18 DIAGNOSIS — J209 Acute bronchitis, unspecified: Secondary | ICD-10-CM | POA: Diagnosis not present

## 2024-06-18 DIAGNOSIS — J40 Bronchitis, not specified as acute or chronic: Secondary | ICD-10-CM

## 2024-06-18 DIAGNOSIS — C7951 Secondary malignant neoplasm of bone: Secondary | ICD-10-CM

## 2024-06-18 DIAGNOSIS — C3490 Malignant neoplasm of unspecified part of unspecified bronchus or lung: Secondary | ICD-10-CM | POA: Diagnosis not present

## 2024-06-18 DIAGNOSIS — Z66 Do not resuscitate: Secondary | ICD-10-CM

## 2024-06-18 DIAGNOSIS — Z515 Encounter for palliative care: Secondary | ICD-10-CM | POA: Diagnosis not present

## 2024-06-18 LAB — CBC
HCT: 34.2 % — ABNORMAL LOW (ref 36.0–46.0)
Hemoglobin: 10.7 g/dL — ABNORMAL LOW (ref 12.0–15.0)
MCH: 30.9 pg (ref 26.0–34.0)
MCHC: 31.3 g/dL (ref 30.0–36.0)
MCV: 98.8 fL (ref 80.0–100.0)
Platelets: 213 K/uL (ref 150–400)
RBC: 3.46 MIL/uL — ABNORMAL LOW (ref 3.87–5.11)
RDW: 12.4 % (ref 11.5–15.5)
WBC: 5.7 K/uL (ref 4.0–10.5)
nRBC: 0 % (ref 0.0–0.2)

## 2024-06-18 LAB — BASIC METABOLIC PANEL WITH GFR
Anion gap: 8 (ref 5–15)
BUN: 19 mg/dL (ref 8–23)
CO2: 31 mmol/L (ref 22–32)
Calcium: 9.3 mg/dL (ref 8.9–10.3)
Chloride: 106 mmol/L (ref 98–111)
Creatinine, Ser: 0.75 mg/dL (ref 0.44–1.00)
GFR, Estimated: >60 mL/min (ref >60)
Glucose, Bld: 101 mg/dL — ABNORMAL HIGH (ref 70–99)
Potassium: 3.7 mmol/L (ref 3.5–5.1)
Sodium: 145 mmol/L (ref 135–145)

## 2024-06-18 LAB — RESPIRATORY PANEL BY PCR

## 2024-06-18 LAB — LACTIC ACID, PLASMA: Lactic Acid, Venous: 1.3 mmol/L (ref 0.5–1.9)

## 2024-06-18 LAB — CBG MONITORING, ED: Glucose-Capillary: 84 mg/dL (ref 70–99)

## 2024-06-18 LAB — PROCALCITONIN: Procalcitonin: <0.1 ng/mL

## 2024-06-18 MED ORDER — MORPHINE SULFATE (PF) 4 MG/ML IV SOLN: 4.0000 mg | INTRAVENOUS | Status: DC | PRN

## 2024-06-18 NOTE — Progress Notes (Signed)
 Virginia [name] [address] [facility] Liaison Note  Received request from Transitions of Care Manager for hospice services at home after discharge. Spoke with patient's husband Virginia [name] and daughter Virginia [name] to initiate education related to hospice philosophy, services, and team approach to care.   Family verbalized understanding of information given. Per discussion, the plan is for possible discharge home by [facility] on 10.10.   DME needs discussed. Family is requesting oxygen only. The address has been verified and is correct in the chart. Virginia [name] is the family contact to arrange time of equipment delivery.  Please send signed and completed DNR home with patient/family. Please provide prescriptions at discharge as needed to ensure ongoing symptom management.   [facility] information and contact numbers given to [place]. Above information shared with Transitions of Care Manager.   Please call with any questions or concerns.  Thank you for the opportunity to participate in this patient's care.   Amy Darien BSN, RN [facility] Liaison [phone]

## 2024-06-18 NOTE — Plan of Care (Signed)
  Problem: Coping: Goal: Level of anxiety will decrease Outcome: Progressing   Problem: Safety: Goal: Ability to remain free from injury will improve Outcome: Progressing   Problem: Education: Goal: Knowledge of disease or condition will improve Outcome: Progressing   Problem: Respiratory: Goal: Ability to maintain a clear airway will improve Outcome: Progressing   Problem: Activity: Goal: Ability to tolerate increased activity will improve Outcome: Progressing   Problem: Clinical Measurements: Goal: Ability to maintain a body temperature in the normal range will improve Outcome: Progressing   Problem: Respiratory: Goal: Ability to maintain a clear airway will improve Outcome: Progressing

## 2024-06-18 NOTE — TOC Initial Note (Signed)
 Transition of Care [facility]) - Initial/Assessment Note    Patient Details  Name: Virginia [name] MRN: [id number] Date of Birth: Mar 10, 1956  Transition of Care [facility]) CM/SW Contact:    Tawni CHRISTELLA Eva, LCSW Phone Number: 06/18/2024, 3:24 PM  Clinical Narrative:                 CSW spoke with pt's husband to discuss rec for home hospice. Pt's husband has chosen [facility] for home hospice services. He reports that the pt will need home O2 and maybe a hoyer Lift. ICM to follow.     Expected Discharge Plan: Home w Hospice Care     Patient Goals and CMS Choice            Expected Discharge Plan and Services                                              Prior Living Arrangements/Services                       Activities of Daily Living   ADL Screening (condition at time of admission) Independently performs ADLs?: No Does the patient have a NEW difficulty with bathing/dressing/toileting/self-feeding that is expected to last >3 days?: Yes (Initiates electronic notice to provider for possible OT consult) Does the patient have a NEW difficulty with getting in/out of bed, walking, or climbing stairs that is expected to last >3 days?: Yes (Initiates electronic notice to provider for possible PT consult) Does the patient have a NEW difficulty with communication that is expected to last >3 days?: No Is the patient deaf or have difficulty hearing?: No Does the patient have difficulty seeing, even when wearing glasses/contacts?: No Does the patient have difficulty concentrating, remembering, or making decisions?: No  Permission Sought/Granted                  Emotional Assessment              Admission diagnosis:  Lethargy [R53.83] Bronchitis [J40] Pneumonia of both upper lobes due to infectious organism [J18.9] Patient Active Problem List   Diagnosis Date Noted   Bronchitis 06/17/2024   Rhinovirus infection 04/28/2024   Physical  deconditioning 04/28/2024   Multifocal pneumonia 04/27/2024   UTI (urinary tract infection) 04/27/2024   GERD (gastroesophageal reflux disease) 04/27/2024   Pericardial effusion without cardiac tamponade 12/10/2023   Acute pulmonary embolism (HCC) 08/26/2023   Acute hypoxemic respiratory failure (HCC) 08/26/2023   SIRS (systemic inflammatory response syndrome) (HCC) 08/26/2023   Single subsegmental pulmonary embolism without acute cor pulmonale (HCC) 08/26/2023   Heart failure with reduced ejection fraction (HCC) 08/26/2023   Chronic pain 08/26/2023   Essential hypertension 08/26/2023   Type 2 diabetes mellitus with complication, without long-term current use of insulin  (HCC) 08/26/2023   Renal insufficiency 08/26/2023   Hypomagnesemia 06/30/2023   History of DVT (deep vein thrombosis) 06/10/2023   Urine retention 06/10/2023   Acute blood loss anemia 06/10/2023   History of ventricular fibrillation 06/10/2023   Cardiac arrest (HCC) 06/03/2023   Need for emotional support 05/09/2023   Medication management 05/09/2023   Nausea 05/09/2023   DNR (do not resuscitate) 05/09/2023   Palliative care encounter 05/09/2023   Debility 05/08/2023   Counseling and coordination of care 05/08/2023   AKI (acute kidney injury) 05/03/2023   Anemia 05/03/2023  Cancer associated pain 11/14/2021   Rash 11/14/2021   Hyponatremia 11/14/2021   Skin rash 11/13/2021   Hypokalemia 11/13/2021   Febrile illness 11/13/2021   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   Port-A-Cath in place 12/15/2020   Lung cancer metastatic to brain [facility]) 12/02/2020   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) 03/15/2020   Goals of care, counseling/discussion 02/25/2020   Palliative care patient 12/16/2019   Malignant neoplasm metastatic to bone (HCC) 10/21/2019   Malignant neoplasm metastatic to brain (HCC) 10/15/2019   Right lower lobe lung mass 09/25/2019   COPD (chronic obstructive pulmonary  disease) (HCC) 09/25/2019   Impaired glucose tolerance 12/08/2017   Vitamin D  deficiency 12/08/2017   Microscopic hematuria 04/01/2014   Hypertension 01/11/2012   Palpitations 01/11/2012   Hyperlipidemia 12/25/2011   History of smoking 12/25/2011   Anxiety 04/29/2011   PCP:  Perri Ronal PARAS, MD Pharmacy:   [facility] [phone] - [place], [place] - [address] AT [facility] [address]  [place] [address] Phone: [phone] Fax: [phone]     Social Drivers of Health (SDOH) Social History: SDOH Screenings   Food Insecurity: No Food Insecurity (06/18/2024)  Housing: Low Risk  (06/18/2024)  Transportation Needs: No Transportation Needs (04/27/2024)  Utilities: Not At Risk (04/27/2024)  Depression (PHQ2-9): Low Risk  (05/22/2024)  Social Connections: Moderately Isolated (04/27/2024)  Tobacco Use: Medium Risk (04/26/2024)   SDOH Interventions:     Readmission Risk Interventions    12/11/2023   11:41 AM 06/06/2023    3:51 PM 05/07/2023    1:35 PM  Readmission Risk Prevention Plan  Transportation Screening Complete Complete Complete  PCP or Specialist Appt within 3-5 Days   Complete  HRI or Home Care Consult   Complete  Social Work Consult for Recovery Care Planning/Counseling   Complete  Palliative Care Screening   Not Applicable  Medication Review [employer]) Complete Complete Complete  HRI or Home Care Consult Complete Complete   SW Recovery Care/Counseling Consult Complete Complete   Palliative Care Screening -- Complete   Skilled Nursing Facility Not Applicable Not Applicable

## 2024-06-18 NOTE — Evaluation (Signed)
 Clinical/Bedside Swallow Evaluation Patient Details  Name: Virginia [name] MRN: [id number] Date of Birth: Feb 12, 1956  Today's Date: 06/18/2024 Time: SLP Start Time (ACUTE ONLY): 1605 SLP Stop Time (ACUTE ONLY): 1705 SLP Time Calculation (min) (ACUTE ONLY): 60 min  Past Medical History:  Past Medical History:  Diagnosis Date   Anemia    Anxiety    Concussion 09/28/2019   DVT (deep venous thrombosis) (HCC) 2021   L leg   Dyspnea    GERD (gastroesophageal reflux disease)    Hypercholesterolemia    per pt, she does not have elevated lipids   Hypertension    met lung ca dx'd 09/2019   mets to spine, hip and brain   PONV (postoperative nausea and vomiting)    Tobacco abuse    Past Surgical History:  Past Surgical History:  Procedure Laterality Date   ABDOMINAL HYSTERECTOMY     partial/ left ovaries   APPLICATION OF CRANIAL NAVIGATION N/A 12/02/2020   Procedure: APPLICATION OF CRANIAL NAVIGATION;  Surgeon: [name], MD;  Location: [facility] OR;  Service: Neurosurgery;  Laterality: N/A;   CHOLECYSTECTOMY     CRANIOTOMY N/A 12/02/2020   Procedure: Posterior fossa craniotomy for tumor resection with brainlab;  Surgeon: [name], MD;  Location: [facility] OR;  Service: Neurosurgery;  Laterality: N/A;   DILATION AND CURETTAGE OF UTERUS     IR IMAGING GUIDED PORT INSERTION  10/23/2019   IR PATIENT EVAL TECH 0-60 MINS  11/21/2021   IR PATIENT EVAL TECH 0-60 MINS  11/24/2021   IR PATIENT EVAL TECH 0-60 MINS  11/29/2021   IR PATIENT EVAL TECH 0-60 MINS  12/04/2021   IR PATIENT EVAL TECH 0-60 MINS  12/12/2021   IR PATIENT EVAL TECH 0-60 MINS  12/19/2021   IR PATIENT EVAL TECH 0-60 MINS  12/27/2021   IR PATIENT EVAL TECH 0-60 MINS  01/03/2022   IR PATIENT EVAL TECH 0-60 MINS  01/10/2022   IR PATIENT EVAL TECH 0-60 MINS  01/18/2022   IR PATIENT EVAL TECH 0-60 MINS  02/13/2022   IR PATIENT EVAL TECH 0-60 MINS  02/20/2022   IR RADIOLOGIST EVAL & MGMT  01/23/2022   IR REMOVAL TUN ACCESS W/ PORT W/O FL MOD SED   11/17/2021   KYPHOPLASTY N/A 03/15/2020   Procedure: Thoracic Eight KYPHOPLASTY;  Surgeon: [name], MD;  Location: [facility] OR;  Service: Neurosurgery;  Laterality: N/A;  prone    LIPOMA EXCISION  2018   removed under left breast and right thigh.   PERICARDIOCENTESIS N/A 12/10/2023   Procedure: PERICARDIOCENTESIS;  Surgeon: Wendel Lurena POUR, MD;  Location: [facility] INVASIVE CV LAB;  Service: Cardiovascular;  Laterality: N/A;   TUBAL LIGATION     HPI:  Pt is a 68 yo female with cough and congestion from SNF.  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  Swallow eval ordered.  Spouse reports pt has undergone prior FEES study and aspirated - She had been consuming water via provale cup but hydration was a concern therefore it was stopped.  Pt CT chest showed 06/18/2024 No significant change in masslike consolidation of the dependent  right lower lobe with a small right  pleural effusion.  2. Improved aeration of the dependent left lung base with chronic  scarring or atelectasis.  3. Background of fine centrilobular nodularity and ground-glass  throughout the upper lungs, worsened compared to prior examination, consistent with nonspecific atypical infection or inflammation.    Assessment / Plan / Recommendation  Clinical Impression  Pt alert in bed with spouse present. She was willing to accept intake and oral care, notable for slight brown coating on tongue and dried secretions in oral cavity indicative of pt's decreased awareness/prior lethargy.  Lingual coating remained despite brushing and oral suction and may  be an indication of oral candidiasis.  Pt denies odynophagia or lingual pain.  SLP assisted pt to self feed with her using her right hand (left HP).  Swallow clinically judged to be timely with observation of laryngeal elevation - with no s/s of aspiration with puree or solids and no significant oral retention.  Subtle cough and wet voice noted post-liquid swallow (approx 20% of boluses) and cued cough/throat clearing facilitated clearing of voice.  Pt is xerostomic and thus recommend to start all intake with liquids - prefer water due to pH neutral status.  Using teach back, educated pt and spouse to recommendations for aspiration/dysphagia mitigations.  Spouse reports he is interested in oral suction device for home use.  Also advised he obtain apparatus for airway clearance in emergencies - such as Life Vac.  Pt able to demonstrate cough and throat clearing on command and encouraged her to keep capability as strong as able for airway clearance.  Will follow up x1 per spouse request. SLP Visit Diagnosis: Dysphagia, oropharyngeal phase (R13.12);Dysphagia, unspecified (R13.10)    Aspiration Risk  Moderate aspiration risk;Risk for inadequate nutrition/hydration    Diet Recommendation Regular;Thin liquid (pt is comfort care)   prefer water with meals  Liquid Administration via: Straw Medication Administration: Whole meds with puree Supervision: Patient able to self feed;Full supervision/cueing for compensatory strategies Compensations: Slow rate;Small sips/bites Postural Changes: Seated upright at 90 degrees;Remain upright for at least 30 minutes after po intake    Other  Recommendations Oral Care Recommendations: Oral care BID Caregiver Recommendations: Have oral suction available     Assistance Recommended at Discharge  Full   Functional Status Assessment Patient has had a recent decline in their functional status and demonstrates the ability to make significant improvements in function in a  reasonable and predictable amount of time.  Frequency and Duration min 1 x/week  1 week       Prognosis Prognosis for improved oropharyngeal function: Fair Barriers to Reach Goals: Severity of deficits;Time post onset;Other (Comment) (medical diagnosis)      Swallow Study   General Date of Onset: 06/17/24 HPI: Pt is a 68 yo female with cough and congestion from SNF.  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  Swallow eval ordered.  Spouse reports pt has undergone prior FEES study and aspirated - She had been consuming water via provale cup but hydration was a concern therefore it was stopped.  Pt CT chest showed 06/18/2024 No significant change in masslike consolidation of the dependent  right lower lobe with a small right pleural  effusion.  2. Improved aeration of the dependent left lung base with chronic  scarring or atelectasis.  3. Background of fine centrilobular nodularity and ground-glass  throughout the upper lungs, worsened compared to prior examination, consistent with nonspecific atypical infection or inflammation. Diet Prior to this Study: Regular;Thin liquids (Level 0) Respiratory Status: Room air History of Recent Intubation: No Behavior/Cognition: Alert;Cooperative;Pleasant mood Oral Care Completed by SLP: Yes Oral Cavity - Dentition: Adequate natural dentition Vision: Functional for self-feeding Baseline Vocal Quality: Suspected CN X (Vagus) involvement;Low vocal intensity;Other (comment) (occasionally gurgly voice) Volitional Cough: Strong Volitional Swallow: Able to  elicit    Oral/Motor/Sensory Function Overall Oral Motor/Sensory Function: Other (comment) (pt leaning to the left, potential slightly decreased labial seal)   Ice Chips Ice chips: Not tested   Thin Liquid Thin Liquid: Impaired Presentation: Straw;Spoon Oral Phase Impairments: Reduced labial seal;Reduced lingual movement/coordination Oral Phase Functional Implications: Prolonged oral transit Pharyngeal  Phase Impairments: Suspected delayed Swallow;Multiple swallows;Cough - Immediate;Cough - Delayed    Nectar Thick Nectar Thick Liquid: Not tested (pt did not like the taste of thickened liquids and was found to aspirate them)   Honey Thick Honey Thick Liquid: Not tested   Puree Puree: Within functional limits Presentation: Self Fed;Spoon   Solid     Solid: Within functional limits Presentation: Self Fed      [name] 06/18/2024,6:08 PM  [name], MS [facility] SLP Acute Rehab Services Office [phone]

## 2024-06-18 NOTE — Consult Note (Signed)
 Palliative Care Consult Note                                  Date: 06/18/2024   Patient Name: Virginia [name]  DOB: Sep 26, 1955  MRN: [id number]  Age / Sex: 68 y.o., female  PCP: Perri Ronal PARAS, MD Referring Physician: Barbarann Nest, MD  Reason for Consultation: Establishing goals of care, Pain control, Psychosocial/spiritual support, and Terminal Care  HPI/Patient Profile: Palliative Care consult requested for goals of care discussion in this 68 y.o. 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  She was admitted on 06/17/2024 from [facility] with shortness of breath. Chest x-ray showed worsening pleural effusions.    Past Medical History:  Diagnosis Date   Anemia    Anxiety    Concussion 09/28/2019   DVT (deep venous thrombosis) (HCC) 2021   L leg   Dyspnea    GERD (gastroesophageal reflux disease)    Hypercholesterolemia    per pt, she does not have elevated lipids   Hypertension    met lung ca dx'd 09/2019   mets to spine, hip and brain   PONV (postoperative nausea and vomiting)    Tobacco abuse      Subjective:   This NP Virginia [name] reviewed medical records, received report from team, assessed the patient and then met at the patient's bedside with patient and her husband Virginia [name] to discuss diagnosis, prognosis, GOC, EOL wishes disposition and options.  Virginia [name] is resting in bed comfortably.  Patient is somewhat somnolent I did not engage much in conversation.  This palliative provider is familiar with patient and her family from close follow-up at [facility].   Concept of Palliative Care was introduced as specialized medical care for  people and their families living with serious illness.  It focuses on providing relief from the symptoms and stress of a serious illness.  The goal is to improve quality of life for both the patient and the family. Values and goals of care important to patient and family were attempted to be elicited.  I created space and opportunity for for Virginia [name] to explore state of health prior to admission, thoughts, and feelings. He is emotional expressing the significant changes that he has observed in Virginia [name] over the past weeks-month. Emotional support provided.   Husband reports patient has been experiencing increased sleepiness recently. Pain management is ongoing with a reduction in the use of breakthrough pain medication due to lethargy, currently at about two doses per day of her breakthrough oxycodone . She is on extended-release morphine  15 mg every 12 hours, and is managing to swallow her medications with applesauce. I discussed with family regarding transitioning oral pain medications to fentanyl  patch and Roxanol however Virginia [name] would like to maintain her routine' for as long as they can for normalcy. Education provided on use of pain patch and sublingual in the setting it becomes difficult for patient to swallow or she becomes more lethargic. He verbalized understanding.   We discussed Her current illness and what it means in the larger context of Her on-going co-morbidities. Natural disease trajectory and expectations were discussed.  Virginia [name] verbalized understanding of current illness and co-morbidities. He shares discussions amongst  patient and family over the past several days specific to her wishes. Virginia [name] has made it clear she is at peace with focusing on a natural death. Husband is emotional expressing they are ready to prepare for end-of-life however he wishes to honor Virginia [name]'s wishes and keep her in the home amongst family and friends if at all possible.    I discussed the  importance of continued conversation with family and their medical providers regarding overall plan of care and treatment options, ensuring decisions are within the context of the patients values and GOCs.  Questions and concerns were addressed. The patient and family was encouraged to call with questions or concerns.  PMT will continue to support holistically as needed.  Objective:   Primary Diagnoses: Present on Admission:  Heart failure with reduced ejection fraction (HCC)  Pericardial effusion without cardiac tamponade  Physical deconditioning  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 [facility])  Essential hypertension  Type 2 diabetes mellitus with complication, without long-term current use of insulin  (HCC)  Bronchitis   Scheduled Meds:  dexamethasone   0.5 mg Oral Daily   furosemide   20 mg Oral Daily   ipratropium-albuterol   3 mL Nebulization TID   levETIRAcetam   1,000 mg Oral BID   metoprolol  succinate  25 mg Oral Daily   morphine   15 mg Oral Q12H   OLANZapine   2.5 mg Oral QHS   pantoprazole   40 mg Oral Daily   polyethylene glycol  17 g Oral Daily    Continuous Infusions:  azithromycin      ceFEPime  (MAXIPIME ) IV 2 g (06/18/24 1545)    PRN Meds: acetaminophen  **OR** acetaminophen , albuterol , ALPRAZolam , hydrocortisone , ondansetron  **OR** ondansetron  (ZOFRAN ) IV, oxyCODONE , senna-docusate  Allergies  Allergen Reactions   Scopolamine  Rash   Review of Systems  Unable to perform ROS: Acuity of condition    Physical Exam General: NAD, frail chronically-ill appearing Cardiovascular: regular rate and rhythm Pulmonary: diminished bilaterally, 2L/[place] Abdomen: soft, nontender, + bowel sounds Extremities: no edema, no joint deformities Skin: no rashes, warm and dry Neurological: somnolent   Vital Signs:  BP 104/80 (BP Location: Right Arm)   Pulse 73   Temp 97.9 F (36.6 C) (Axillary)   Resp 18   Ht 5' 2 (1.575 m)   Wt 85.2 kg   SpO2 97%   BMI 34.35 kg/m   Pain Scale: 0-10   Pain Score: 8   SpO2: SpO2: 97 % O2 Device:SpO2: 97 % O2 Flow Rate: .O2 Flow Rate (L/min): 3 L/min  IO: Intake/output summary: No intake or output data in the 24 hours ending 06/18/24 1724  LBM:   Baseline Weight: Weight: 85.2 kg Most recent weight: Weight: 85.2 kg      Palliative Assessment/Data: PPS 20%   Advanced Care Planning:   Primary Decision Maker: HCPOA-Husband Virginia [name] [place]   Code Status/Advance Care Planning: DNR  A discussion was had today regarding advanced directives. Concepts specific to code status, artifical feeding and hydration, continued IV antibiotics and rehospitalization was had.  The difference between a aggressive medical intervention path and a palliative comfort care path was discussed.   Mr. Dehart is clear in expressed wishes on behalf of [place] and their family to continue supportive care. No escalation. Goals is focused on comfort and getting patient home over the next 24 hours with outpatient hospice support for what time she has left.  She currently has hospital bed and other medical equipment in the home.  Husband states patient will need home oxygen and transportation  home for comfort.  We discussed patient's current full code status. Mr. Letson expresses desires for natural death with no life-sustaining measures. Education provided on DNR/DNI. He verbalized understanding and confirmed wishes on for DNR.  Hospice services outpatient were explained and offered including inpatient hospice unit. Husband verbalized their understanding and awareness of both palliative and hospice's goals and philosophy of care.   Assessment & Plan:   SUMMARY OF RECOMMENDATIONS   DNR/DNI-as confirmed by patient's husband Continue with current plan of care with supportive with a goal of comfort.  Extensive goals of care discussions with husband, Virginia [name]. He is clear in expressed wishes to transition patient home over the next 24-48 hours to spend  what time patient has left amongst her family and friends in the home.  Patient has expressed these wishes to him over the past 24 hours and he hopes to be able to honor them.  No escalation in care. Education provided on hospice goals and philosophy of care.  Husband would like to proceed with outpatient hospice referral.  Education provided on referral process.  Patient will need home DME to include oxygen and PTR for transportation.  TOC referral has been placed. PMT will continue to support and follow as needed. Please call team line or secure chat for urgent unmet palliative needs.   Symptom Management:  Pain MS Contin  15mg  every 12 hours Oxycodone  5mg  every 4 hours as needed Tylenol  650mg  every 6 hours as needed  Nausea Zofran  4mg  every 6 hours  Constipation Miralax  daily  Anxiety  Xanax  0.25mg  twice daily as needed  Palliative Prophylaxis:  Aspiration, Bowel Regimen, Frequent Pain Assessment, Oral Care, and Turn Reposition  Additional Recommendations (Limitations, Scope, Preferences): DNR/DNI, comfort focused, continued supportive care   Psycho-social/Spiritual:  Desire for further Chaplaincy support: no Additional Recommendations: Education on Hospice  Prognosis:  Days-Weeks   Discharge Planning:  Home with Hospice   Patient's husband expressed understanding and was in agreement with this plan.   Time Total: 75 min  Visit consisted of counseling and education dealing with the complex and emotionally intense issues of symptom management and palliative care in the setting of serious and potentially life-threatening illness.  Signed by:  Virginia Borer, AGPCNP-BC Pall[facility]   Phone: [phone] Pager: [phone] Amion: GEANNIE [name]   Thank you for allowing the Palliative Medicine Team to assist in the care of this patient. Please utilize secure chat with additional questions, if there is no response within 30 minutes please  call the above phone number. Palliative Medicine Team providers are available by phone from 7am to 5pm daily and can be reached through the team cell phone.  Should this patient require assistance outside of these hours, please call the patient's attending physician.  *Please note that this is a verbal dictation therefore any spelling or grammatical errors are due to the Dragon [employer] One system interpretation.

## 2024-06-18 NOTE — Evaluation (Signed)
 SLP Cancellation Note  Patient Details Name: Virginia [name] MRN: [id number] DOB: [birth year]   Cancelled treatment:       Reason Eval/Treat Not Completed: Other (comment) (2nd attempt to see pt, pt awake but now wet and needing cleaned as well as RT coming to see pt, will continue efforts- asked secretary to obtain regulator and bifurcating adaptor.)  [name], MS [facility] SLP Acute Rehab Services Office [phone]   [name] 06/18/2024, 2:56 PM

## 2024-06-18 NOTE — Progress Notes (Addendum)
 SLP Cancellation Note  Patient Details Name: Virginia [name] MRN: [id number] DOB: 01/17/1956   Cancelled treatment:        pt lethargic, spouse present and reports pt has had had more gurgling within the last few week and has been unable to cough/expectorate secretions. He reports she has had more coughing with intake but is able to feed herself.  Reportedly pt underwent a FEES study at the SNF and was found to aspirate thick liquids. A 5 CC Provale cup was provided for pt by SNF SLP per spouse, but he dc'd its use due to increased risk of dehydration.  Spouse reports he has been informed of pt's chronic aspiration risk that could lead to recurrent pulmonary infections, aspiration and death.  Spouse inquiring re: alternative means of nutrition - advised each intervention carries risks.  Will continue efforts to see pt for evaluation.  Note, pt is being followed by palliative care.    [name], MS [facility] SLP Acute Rehab Services Office [phone]   Spent approx 15 minutes talking with pt's spouse obtaining background information and providing education re: risk factors for asp pna and dysphagia.    [name] 06/18/2024, 1:05 PM

## 2024-06-18 NOTE — Evaluation (Signed)
 SLP Cancellation Note  Patient Details Name: Virginia [name] MRN: [id number] DOB: [birth year]   Cancelled treatment:       Reason Eval/Treat Not Completed: Other (comment) (3rd attempt to see pt, pt now getting pt care  presumed cleaned, will continue efforts)  [name], MS [facility] SLP Acute Rehab Services Office [phone]  [name] 06/18/2024, 3:42 PM

## 2024-06-18 NOTE — Progress Notes (Signed)
 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  CT chest with no significant change of masslike consolidation of RLL, worsening GGO in upper lungs suggestive of atypical infection/inflammation, and unchanged large pericardial effusion.  Started on empiric antibiotics, speech evaluation ordered.    Assessment and Plan:  Failure to thrive associated with NSCLC with brain and bone mets Patient with known metastatic lung CA presenting with lethargy, failure to thrive She was last treated with immunotherapy last week, possibly related to this but more likely related to progressive malignancy She is beginning to feel like she isn't strong enough to continue to fight, and her husband is in agreement She previously declined hospice but is now ready Based on ongoing discussions, will monitor overnight and likely dc to home with hospice tomorrow Palliative/oncology notified and will see her Hospice notified and will also consulted  Chronic hypoxia on 2l [place] Known COPD and lung CA as above CT chest w/ GGO changes, no obvious  pneumonia On empiric antibiotics in the setting of immunotherapy with brain mets and worsening symptoms - patient and husband request being sent home to complete antibiotics Speech evaluation requested  Continue bronchodilators as needed Resume her home steroid 0.5mg  decadron  Continue [place] 02 - 2l home setting from last discharge  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  Anxiety disorder Continue Xanax  prn, Zyprexa   Chronic pain Continue opiates - mscontin 15mg  bid and oxy 5mg  prn  Continue bowel regimen Will add IV morphine  as needed for breakthrough pain  GERD Continue PPI  Pericardial effusion without cardiac tamponade CT shows large pericardial effusion unchanged Likely malignant effusion  CHF mrEF Essential hypertension Resume home metoprolol  and Lasix .  T2DM W/ complication Last A1c in August 5.8 Stop accuchecks and SSI Liberalize diet  Elevated troponin Flat No chest pain No further intervention  Class I Obesity Body mass index is 34.35 kg/m.[name]  Significantly low or high BMI is associated with higher medical risk including morbidity and mortality   Goals of care Palliative care consulted Patient is now DNR, comfort measures Will stop telemetry    Consultants: Palliative care SLP Hospice liaison  Procedures: None  Antibiotics: Azithromycin  10/8-12 Cefepime  10/8-  30 Day Unplanned Readmission Risk Score    Flowsheet Row ED to Hosp-Admission (Discharged) from 04/26/2024 in [place] LONG 4TH FLOOR PROGRESSIVE CARE AND UROLOGY  30 Day Unplanned Readmission Risk Score (%) 45.14 Filed at 05/01/2024 1600    This score is the patient's risk of an unplanned readmission within 30 days of being discharged (0 -100%). The score is based on dignosis, age, lab data, medications, orders, and past utilization.   Low:  0-14.9   Medium: 15-21.9   High: 22-29.9   Extreme: 30 and above          Subjective: Somnolent this AM, much more interactive and perky this afternoon and telling jokes. Understands situation, in agreement with hospice, wants to go skipping with Jesus.  Physical Exam: Vitals:   06/18/24 0600  06/18/24 0615 06/18/24 0630 06/18/24 0730  BP: 105/78 108/81    Pulse: 70 70    Resp: 12 12    Temp:   98.1 F (36.7 C)   TempSrc:   Oral   SpO2: 99% 99%  98%   Weight:      Height:        No intake or output data in the 24 hours ending 06/18/24 0912 Filed Weights   06/17/24 1420  Weight: 85.2 kg    Exam:  General:  Appears somnolent with mild cognitive slowing this AM, improved somewhat this afternoon Eyes:  normal lids, iris ENT:  grossly normal hearing, lips & tongue, mmm Cardiovascular:  RRR. No LE edema.  Respiratory:   CTA bilaterally with no wheezes/rales/rhonchi.  Normal respiratory effort. Abdomen:  soft, NT, ND Skin:  no rash or induration seen on limited exam Musculoskeletal:  decreased strength diffusely, no bony abnormality Psychiatric:  somnolent/blunted mood and affect, speech fluent and appropriate, AOx3 Neurologic:  CN 2-12 grossly intact  Data Reviewed: I have reviewed the patient's lab results since admission.  Pertinent labs for today include:  VBG: 7.35/61/33.7 Stable BMP Procalcitonin <0.10 Stable CBC Blood cultures pending    Family Communication: Husband present  Disposition: Status is: Observation The patient remains OBS appropriate and will d/c before 2 midnights.  Planned Discharge Destination: Home with hospice    Time spent: 50 minutes  Author: Delon Herald, MD 06/18/2024 9:06 AM  For on call review [email or web address].

## 2024-06-19 DIAGNOSIS — J209 Acute bronchitis, unspecified: Secondary | ICD-10-CM | POA: Diagnosis not present

## 2024-06-19 DIAGNOSIS — J40 Bronchitis, not specified as acute or chronic: Secondary | ICD-10-CM | POA: Diagnosis not present

## 2024-06-19 LAB — HIV ANTIBODY (ROUTINE TESTING W REFLEX): HIV Screen 4th Generation wRfx: NONREACTIVE

## 2024-06-19 MED ORDER — AMOXICILLIN-POT CLAVULANATE 875-125 MG PO TABS: 1.0000 | ORAL_TABLET | Freq: Two times a day (BID) | ORAL | 0 refills | Status: AC

## 2024-06-19 MED ORDER — ACETAMINOPHEN 650 MG RE SUPP: 650.0000 mg | Freq: Four times a day (QID) | RECTAL | Status: DC | PRN

## 2024-06-19 NOTE — Care Management Obs Status (Signed)
 MEDICARE OBSERVATION STATUS NOTIFICATION   Patient Details  Name: Virginia [name] MRN: [id number] Date of Birth: 03-16-1956   Medicare Observation Status Notification Given:  Yes    Ladoris Lythgoe LILLETTE Fenton, LCSW 06/19/2024, 9:41 AM

## 2024-06-19 NOTE — Discharge Summary (Signed)
 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 [facility])   Physical deconditioning   History of DVT (deep vein thrombosis)   Pericardial effusion without cardiac tamponade   Heart failure with reduced ejection fraction (HCC)   Essential hypertension   Type 2 diabetes mellitus with complication, without long-term current use of insulin  [facility])    Hospital Course: [phone] with h/o metastatic lung CA with brain and spinal metastases, 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 CT chest with no significant change of masslike consolidation of RLL, worsening GGO in upper lungs suggestive of atypical infection/inflammation, and unchanged large pericardial effusion. Started on empiric antibiotics, speech evaluation ordered.  After ongoing GOC discussion, patient and family are in agreement with transition to comfort care and home with hospice today.  She wishes to continue course of antibiotics.  Assessment and Plan:  Failure to thrive associated with NSCLC with brain and bone mets Patient with known metastatic lung CA presenting with lethargy, failure to thrive She was last treated with immunotherapy last week, possibly related to this but more likely related to progressive malignancy She is beginning to feel like she isn't strong enough to continue to fight, and her husband is in  agreement She previously declined hospice but is now ready Based on ongoing discussions, will monitor overnight and likely dc to home with hospice tomorrow Palliative/oncology notified and will see her Hospice notified and will also consulted   Chronic hypoxia on 2l [place] Known COPD and lung CA as above CT chest w/ GGO changes, no obvious  pneumonia On empiric antibiotics in the setting of immunotherapy with brain mets and worsening symptoms - patient and husband request being sent home to complete antibiotics Speech evaluation requested  Continue bronchodilators as needed Resume her home steroid 0.5mg  decadron  Continue  02 - 2l home setting from last discharge   History of DVT/PE No longer on anticoagulation due to brain mets with hemorrhagic conversion   Seizure disorder Continue Keppra    Anxiety disorder Continue Xanax  prn, Zyprexa    Chronic pain Continue opiates - mscontin 15mg  bid and oxy 5mg  prn  Continue bowel regimen   GERD Continue PPI   Pericardial effusion without cardiac tamponade CT shows large pericardial effusion unchanged Likely malignant effusion   CHF mrEF Essential hypertension Resume home metoprolol  and Lasix .   T2DM W/ complication Last A1c in August 5.8 Stop accuchecks and SSI Liberalize diet   Elevated troponin Flat No chest pain No further intervention   Class I Obesity Body mass index is 34.35 kg/m.[name]  Significantly low or high BMI is associated with higher medical risk including morbidity and mortality    Goals of care Palliative care consulted Patient is now DNR, comfort measures Home today with hospice       Consultants: Palliative care SLP Hospice liaison   Procedures: None   Antibiotics: Azithromycin  10/8-12 Cefepime  10/8-     Disposition: Hospice  care at home Diet recommendation:  Regular diet DISCHARGE MEDICATION: Allergies as of 06/19/2024       Reactions   Scopolamine  Rash        Medication List      STOP taking these medications    (feeding supplement) PROSource Plus liquid   ALPRAZolam  0.25 MG tablet Commonly known as: XANAX    dexamethasone  1 MG tablet Commonly known as: DECADRON    furosemide  20 MG tablet Commonly known as: LASIX    Magnesium  Glycinate 100 MG Caps   meclizine  50 MG tablet Commonly known as: ANTIVERT    metoprolol  succinate 25 MG 24 hr tablet Commonly known as: TOPROL -XL   multivitamin with minerals Tabs tablet   potassium chloride  10 MEQ tablet Commonly known as: KLOR-CON    PROSTAT PO   VITAMIN B12 PO   Vitamin D3 62.5 MCG Caps   Xtampza  ER 13.5 MG C12a Generic drug: oxyCODONE  ER       TAKE these medications    acetaminophen  325 MG tablet Commonly known as: TYLENOL  Take 1-2 tablets (325-650 mg total) by mouth every 4 (four) hours as needed for mild pain (pain score 1-3). What changed: how much to take   acetaminophen  650 MG suppository Commonly known as: TYLENOL  Place 1 suppository (650 mg total) rectally every 6 (six) hours as needed for mild pain (pain score 1-3) or fever (or Fever >/= 101). What changed: You were already taking a medication with the same name, and this prescription was added. Make sure you understand how and when to take each.   albuterol  108 (90 Base) MCG/ACT inhaler Commonly known as: VENTOLIN  HFA INHALE 2 PUFFS INTO THE LUNGS EVERY 6 HOURS AS NEEDED FOR WHEEZING OR SHORTNESS OF BREATH   amoxicillin -clavulanate 875-125 MG tablet Commonly known as: AUGMENTIN  Take 1 tablet by mouth 2 (two) times daily for 5 days.   bisacodyl  10 MG suppository Commonly known as: DULCOLAX Place 10 mg rectally as needed for moderate constipation.   colchicine  0.6 MG tablet TAKE 1 TABLET(0.6 MG) BY MOUTH DAILY   hydrocortisone  2.5 % rectal cream Commonly known as: ANUSOL -HC APPLY RECTALLY TO THE AFFECTED AREA 2 TO 3 TIMES A DAY AS NEEDED What changed:  how much to take how to take this when to take this reasons to take  this additional instructions   ipratropium-albuterol  0.5-2.5 (3) MG/3ML Soln Commonly known as: DUONEB Take 3 mLs by nebulization 3 (three) times daily.   levETIRAcetam  1000 MG tablet Commonly known as: KEPPRA  Take 1 tablet (1,000 mg total) by mouth 2 (two) times daily.   morphine  15 MG 12 hr tablet Commonly known as: MS CONTIN  Take 15 mg by mouth every 12 (twelve) hours.   OLANZapine  2.5 MG tablet Commonly known as: ZYPREXA  Take 1 tablet (2.5 mg total) by mouth at bedtime.   ondansetron  4 MG tablet Commonly known as: ZOFRAN  TAKE 1 TABLET(4 MG) BY MOUTH EVERY 8 HOURS AS NEEDED FOR NAUSEA OR VOMITING   oxycodone  5 MG capsule Commonly known as: OXY-IR Take 5 mg by mouth every 4 (four) hours as needed.   pantoprazole  40 MG tablet Commonly known as: PROTONIX  TAKE 1 TABLET(40 MG) BY MOUTH DAILY   polyethylene glycol 17 g packet Commonly known as: MIRALAX  / GLYCOLAX  Take 17 g by mouth daily.   senna-docusate 8.6-50 MG tablet Commonly known as: Senokot-S Take 2 tablets by mouth daily as needed (Patient preference, Sennakot S for mild constipation). What changed: when to take this        Discharge  Exam:    Subjective: Somnolent and not interactive today.  Husband believes she has given up and he wants to take her home with hospice.   Objective: Vitals:   06/19/24 0338 06/19/24 0756  BP: 107/70   Pulse: 87   Resp: (!) 22   Temp: 99.1 F (37.3 C)   SpO2: 97% 94%    Intake/Output Summary (Last 24 hours) at 06/19/2024 1357 Last data filed at 06/19/2024 0300 Gross per 24 hour  Intake --  Output 600 ml  Net -600 ml   Filed Weights   06/17/24 1420  Weight: 85.2 kg    Exam:  General:  Appears somnolent, not interactive today Eyes:  normal lids, closed throughout ENT:  grossly normal hearing, lips & tongue, mmm Cardiovascular:  RRR. No LE edema.  Respiratory:   CTA bilaterally with no wheezes/rales/rhonchi.  Normal respiratory effort. Abdomen:  soft,  NT, ND Skin:  no rash or induration seen on limited exam Musculoskeletal:  decreased strength diffusely, no bony abnormality Psychiatric:  somnolent mood and affect, speech absent today Neurologic:  unable to effectively perform  Data Reviewed: I have reviewed the patient's lab results since admission.  Pertinent labs for today include:   None today    Condition at discharge: poor  The results of significant diagnostics from this hospitalization (including imaging, microbiology, ancillary and laboratory) are listed below for reference.   Imaging Studies: CT Lumbar Spine Wo Contrast Result Date: 06/17/2024 CLINICAL DATA:  Metastatic disease evaluation * Tracking Code: BO * EXAM: CT LUMBAR SPINE WITHOUT CONTRAST TECHNIQUE: Multidetector CT imaging of the lumbar spine was performed without intravenous contrast administration. Multiplanar CT image reconstructions were also generated. RADIATION DOSE REDUCTION: This exam was performed according to the departmental dose-optimization program which includes automated exposure control, adjustment of the mA and/or kV according to patient size and/or use of iterative reconstruction technique. COMPARISON:  CT chest abdomen pelvis, 04/26/2024 FINDINGS: Segmentation: Five lumbar type vertebrae. Alignment: Straightening of the normal lumbar lordosis Vertebrae: Osteopenia without fracture or suspicious osseous lesions. Paraspinal and other soft tissues: Aortic atherosclerosis. Disc levels: Minimal multilevel lumbar endplate osteophytosis without significant disc space height loss. Mild multilevel facet degenerative change throughout. Other: Heterogeneously lytic and sclerotic lesion of the right ileal body not significantly changed (series 5, image 107) IMPRESSION: 1. No fracture or dislocation of the lumbar spine. 2. No suspicious osseous lesions of the lumbar vertebral bodies. 3. Heterogeneously lytic and sclerotic lesion of the right ileal body not  significantly changed, most consistent with a treated osseous metastasis. Aortic Atherosclerosis (ICD10-I70.0). Electronically Signed   By: Marolyn JONETTA Jaksch M.D.   On: 06/17/2024 16:48   CT Chest Wo Contrast Result Date: 06/17/2024 CLINICAL DATA:  History of lung cancer, CHF, multifocal pneumonia * Tracking Code: BO * EXAM: CT CHEST WITHOUT CONTRAST TECHNIQUE: Multidetector CT imaging of the chest was performed following the standard protocol without IV contrast. RADIATION DOSE REDUCTION: This exam was performed according to the departmental dose-optimization program which includes automated exposure control, adjustment of the mA and/or kV according to patient size and/or use of iterative reconstruction technique. COMPARISON:  CT chest abdomen pelvis, 04/26/2024 FINDINGS: Cardiovascular: Aortic atherosclerosis. Cardiomegaly with scattered left coronary artery calcifications. Unchanged, large pericardial effusion (series 2, image 73). Mediastinum/Nodes: No enlarged mediastinal, hilar, or axillary lymph nodes. Thyroid  gland, trachea, and esophagus demonstrate no significant findings. Lungs/Pleura: No significant change in masslike consolidation of the dependent right lower lobe with a small right pleural effusion. Improved aeration of the dependent  left lung base with chronic scarring or atelectasis. Background of fine centrilobular nodularity and ground-glass throughout the upper lungs, worsened compared to prior examination (series 6, image 42). Upper Abdomen: No acute abnormality.  Cholecystectomy. Musculoskeletal: Unchanged sclerotic lesion of the sternal body (series 10, image 88). Unchanged vertebral cement augmentation of T8. IMPRESSION: 1. No significant change in masslike consolidation of the dependent right lower lobe with a small right pleural effusion. 2. Improved aeration of the dependent left lung base with chronic scarring or atelectasis. 3. Background of fine centrilobular nodularity and ground-glass  throughout the upper lungs, worsened compared to prior examination, consistent with nonspecific atypical infection or inflammation. 4. Unchanged, large pericardial effusion. 5. Unchanged sclerotic lesion of the sternal body. 6. Coronary artery disease. Aortic Atherosclerosis (ICD10-I70.0). Electronically Signed   By: Marolyn JONETTA Jaksch M.D.   On: 06/17/2024 16:44    Microbiology: Results for orders placed or performed during the hospital encounter of 06/17/24  Blood culture (routine x 2)     Status: None (Preliminary result)   Collection Time: 06/17/24  9:00 PM   Specimen: BLOOD  Result Value Ref Range Status   Specimen Description   Final    BLOOD LEFT ANTECUBITAL Performed at [facility], [address], [place], [place] [address]    Special Requests   Final    Blood Culture adequate volume BOTTLES DRAWN AEROBIC AND ANAEROBIC Performed at [facility], [name], [place], [place] [address]    Culture   Final    NO GROWTH 1 DAY Performed at [facility], [address] [place], [place] [address]    Report Status PENDING  Incomplete  Blood culture (routine x 2)     Status: None (Preliminary result)   Collection Time: 06/17/24  9:00 PM   Specimen: BLOOD RIGHT FOREARM  Result Value Ref Range Status   Specimen Description   Final    BLOOD RIGHT FOREARM Performed at [facility], [address], [place], [place] [address]    Special Requests   Final    Blood Culture adequate volume BOTTLES DRAWN AEROBIC AND ANAEROBIC Performed at [facility], [address] [place], [place] [address]    Culture   Final    NO GROWTH 1 DAY Performed at [facility], [address], [place], [place] [address]    Report Status PENDING  Incomplete  Respiratory (~20 pathogens) panel by PCR     Status: None   Collection Time: 06/17/24 10:09 PM   Specimen: Nasopharyngeal Swab; Respiratory  Result Value Ref Range Status    Adenovirus NOT DETECTED NOT DETECTED Final   Coronavirus 229E NOT DETECTED NOT DETECTED Final    Comment: (NOTE) The Coronavirus on the Respiratory Panel, DOES NOT test for the novel  Coronavirus (2019 nCoV)    Coronavirus HKU1 NOT DETECTED NOT DETECTED Final   Coronavirus NL63 NOT DETECTED NOT DETECTED Final   Coronavirus OC43 NOT DETECTED NOT DETECTED Final   Metapneumovirus NOT DETECTED NOT DETECTED Final   Rhinovirus / Enterovirus NOT DETECTED NOT DETECTED Final   Influenza A NOT DETECTED NOT DETECTED Final   Influenza B NOT DETECTED NOT DETECTED Final   Parainfluenza Virus 1 NOT DETECTED NOT DETECTED Final   Parainfluenza Virus 2 NOT DETECTED NOT DETECTED Final   Parainfluenza Virus 3 NOT DETECTED NOT DETECTED Final   Parainfluenza Virus 4 NOT DETECTED NOT DETECTED Final   Respiratory Syncytial Virus NOT DETECTED NOT DETECTED  Final   Bordetella pertussis NOT DETECTED NOT DETECTED Final   Bordetella Parapertussis NOT DETECTED NOT DETECTED Final   Chlamydophila pneumoniae NOT DETECTED NOT DETECTED Final   Mycoplasma pneumoniae NOT DETECTED NOT DETECTED Final    Comment: Performed at [facility], [address], [place], [place] [address]   *Note: Due to a large number of results and/or encounters for the requested time period, some results have not been displayed. A complete set of results can be found in Results Review.    Labs: CBC: Recent Labs  Lab 06/17/24 1647 06/18/24 0447  WBC 8.5 5.7  NEUTROABS 5.8  --   HGB 10.9* 10.7*  HCT 35.3* 34.2*  MCV 98.9 98.8  PLT 223 213   Basic Metabolic Panel: Recent Labs  Lab 06/17/24 1647 06/18/24 0447  NA 144 145  K 3.6 3.7  CL 102 106  CO2 33* 31  GLUCOSE 118* 101*  BUN 21 19  CREATININE 0.92 0.75  CALCIUM  9.7 9.3   Liver Function Tests: Recent Labs  Lab 06/17/24 1647  AST 15  ALT 13  ALKPHOS 56  BILITOT 0.3  PROT 5.7*  ALBUMIN 3.4*   CBG: Recent Labs  Lab 06/18/24 1103  GLUCAP 84    Discharge  time spent: greater than 30 minutes.  Signed: Delon Herald, MD [facility] 06/19/2024

## 2024-06-19 NOTE — Evaluation (Signed)
 SLP Cancellation Note  Patient Details Name: Virginia [name] MRN: [id number] DOB: June 09, 1956   Cancelled treatment:       Reason Eval/Treat Not Completed: Other (comment);Fatigue/lethargy limiting ability to participate  Virginia POUR, MS [facility] SLP Acute Rehab Services Office [phone]  [name] 06/19/2024, 9:45 AM

## 2024-06-19 NOTE — Progress Notes (Signed)
 Patient asleep at this time. CPT not performed as patient too sleepy and not wanting to wake up.

## 2024-06-19 NOTE — Progress Notes (Signed)
   06/19/24 1140  Spiritual Encounters  Type of Visit Initial  Care provided to: Significant other  Referral source Nurse (RN/NT/LPN)  Reason for visit End-of-life   I responded to a spiritual care consult request by Ernie English, RN on patient's behalf re: HCPOA.  At time of visit, Mrs Norbeck was sleeping sounding but her spouse of 83 yrs, Darina, was present at bedside. I assessed his need for support as he was appropriately teearful and processing anticipatory grief.  Darina debreifed about their happy and long marriage together, also children and grandchildren. He reflected on the many travels and relationships he and [name] enjoyed. He shared photos and described he and [name] as each others  center. They have a [place] faith perspective that is meaningful and [name] listened to her favorite Chrisitan music as she slept.  I provided compassionate presence and both active and reflective listening. I affirmed the importance of those memories of shared experience and the ways their relationship endures in the heart. I offered normalization of emotion and his authentic presence. I offered discussion about hospice and goals. I offered prayer with consent and let Darina know how to request chaplain again through care team as needed.  Armonie Mettler L. Delores HERO.Div

## 2024-06-19 NOTE — TOC Progression Note (Signed)
 Transition of Care [facility]) - Progression Note    Patient Details  Name: Virginia [name] MRN: [id number] Date of Birth: 12/24/1955  Transition of Care [facility] CM/SW Contact  Lorraine LILLETTE [place], [place] Phone Number: 06/19/2024, 1:36 PM  Clinical Narrative:    Visit to pt in room, pt resting,spouse present.  Spouse call daughter to talk as well. Family shared that they have accepted [facility] for home hospice and that the 02 was delivered yesterday evening. Family will hold off on a hoyer lift request until Hospice visits and determine type/need. Family is ready to have pt DC once MD determined.  Asked CSW if pt could be bathed this morning,  CSW agreed and followed up with RN as leaving.  CSW will watch for DC summary and prepare Med necessity.    Expected Discharge Plan: Home w Hospice Care Barriers to Discharge: Continued Medical Work up               Expected Discharge Plan and Services                                               Social Drivers of Health (SDOH) Interventions SDOH Screenings   Food Insecurity: No Food Insecurity (06/18/2024)  Housing: Low Risk  (06/18/2024)  Transportation Needs: No Transportation Needs (04/27/2024)  Utilities: Not At Risk (04/27/2024)  Depression (PHQ2-9): Low Risk  (05/22/2024)  Social Connections: Moderately Isolated (04/27/2024)  Tobacco Use: Medium Risk (04/26/2024)    Readmission Risk Interventions    12/11/2023   11:41 AM 06/06/2023    3:51 PM 05/07/2023    1:35 PM  Readmission Risk Prevention Plan  Transportation Screening Complete Complete Complete  PCP or Specialist Appt within 3-5 Days   Complete  HRI or Home Care Consult   Complete  Social Work Consult for Recovery Care Planning/Counseling   Complete  Palliative Care Screening   Not Applicable  Medication Review Oceanographer) Complete Complete Complete  HRI or Home Care Consult Complete Complete   SW Recovery Care/Counseling Consult Complete Complete    Palliative Care Screening -- Complete   Skilled Nursing Facility Not Applicable Not Applicable

## 2024-06-23 LAB — CULTURE, BLOOD (ROUTINE X 2)
Culture: NO GROWTH
Culture: NO GROWTH
Special Requests: ADEQUATE
Special Requests: ADEQUATE

## 2024-06-25 ENCOUNTER — Telehealth: Payer: Self-pay | Admitting: *Deleted

## 2024-06-25 NOTE — Telephone Encounter (Signed)
 TCT patient's husband, Darina, to check on pt status. Spoke with Darina. He states Hospice has been coming frequently. Initially they told she had hours to days to live but she has had a bit of a rally and now it is days. She is not awake but appears comfortable. Assured Darina that we are thinking of him and [name].  He said he will keep in touch with us  in the days ahead.

## 2024-07-01 ENCOUNTER — Inpatient Hospital Stay

## 2024-07-02 ENCOUNTER — Encounter: Payer: Self-pay | Admitting: Hematology and Oncology

## 2024-07-03 ENCOUNTER — Inpatient Hospital Stay

## 2024-07-03 ENCOUNTER — Inpatient Hospital Stay: Admitting: Physician Assistant

## 2024-07-13 ENCOUNTER — Telehealth: Payer: Self-pay | Admitting: *Deleted

## 2024-07-13 NOTE — Telephone Encounter (Signed)
 Received call from pt's husband.  Darina states that Carley has had a bit of a rally. She is more alert than she has been in quite awhile. She has recently asked if she can have more immunotherapy. Darina is calling to ask about that. Carley has Hospice services currently, is comfortable . She has not been out of bed in several weeks and Darina states she is very unlikely to be able to stand on her own at all. Advised that I would discuss with Dr. Federico and get back to him.  Discussed with Dr, Federico.  He states that any further immunotherpay is not likely to extend her life at all. The stress of getting transported here and going through treatment again would be more stress on her thanshe could handle going forward. TCT back to [place] and advised of the above opinion from Dr. Federico. Reinforced that her days of being awake and interacting with him and their family is a gift, and to embrace these moments. Actually spoke to Auberry as well. She states she understands and did say she thought it might be too much for her. Instead, she states that she will have Darina continue to decorate the house for Christmas and enjoy all that she can. Carley is in agreement with all of this and appreciated the honest feedback. -as was Randy. Encouraged them to stay in touch in the days and weeks ahead.

## 2024-07-24 ENCOUNTER — Ambulatory Visit

## 2024-07-24 ENCOUNTER — Other Ambulatory Visit

## 2024-07-24 ENCOUNTER — Ambulatory Visit: Admitting: Hematology and Oncology

## 2024-08-13 ENCOUNTER — Other Ambulatory Visit: Payer: Self-pay | Admitting: Internal Medicine

## 2024-08-14 ENCOUNTER — Other Ambulatory Visit

## 2024-08-14 ENCOUNTER — Ambulatory Visit: Admitting: Physician Assistant

## 2024-08-14 ENCOUNTER — Ambulatory Visit

## 2024-09-04 ENCOUNTER — Ambulatory Visit: Admitting: Physician Assistant

## 2024-09-04 ENCOUNTER — Other Ambulatory Visit

## 2024-09-04 ENCOUNTER — Ambulatory Visit

## 2024-09-10 DEATH — deceased

## 2024-09-18 ENCOUNTER — Other Ambulatory Visit

## 2024-09-21 ENCOUNTER — Inpatient Hospital Stay

## 2024-09-21 ENCOUNTER — Telehealth: Admitting: Internal Medicine

## 2024-09-21 ENCOUNTER — Encounter
